# Patient Record
Sex: Male | Born: 1945
Health system: Southern US, Community
[De-identification: ages and names within clinical notes are randomized; demographics above are authoritative.]

## PROBLEM LIST (undated history)

## (undated) DIAGNOSIS — D649 Anemia, unspecified: Secondary | ICD-10-CM

## (undated) DIAGNOSIS — I639 Cerebral infarction, unspecified: Secondary | ICD-10-CM

## (undated) DIAGNOSIS — M109 Gout, unspecified: Secondary | ICD-10-CM

## (undated) DIAGNOSIS — Z923 Personal history of irradiation: Secondary | ICD-10-CM

## (undated) DIAGNOSIS — G609 Hereditary and idiopathic neuropathy, unspecified: Secondary | ICD-10-CM

## (undated) DIAGNOSIS — Z9889 Other specified postprocedural states: Secondary | ICD-10-CM

## (undated) DIAGNOSIS — E669 Obesity, unspecified: Secondary | ICD-10-CM

## (undated) DIAGNOSIS — N189 Chronic kidney disease, unspecified: Secondary | ICD-10-CM

## (undated) DIAGNOSIS — R0609 Other forms of dyspnea: Secondary | ICD-10-CM

## (undated) DIAGNOSIS — G4733 Obstructive sleep apnea (adult) (pediatric): Secondary | ICD-10-CM

## (undated) DIAGNOSIS — K5790 Diverticulosis of intestine, part unspecified, without perforation or abscess without bleeding: Secondary | ICD-10-CM

## (undated) DIAGNOSIS — R609 Edema, unspecified: Secondary | ICD-10-CM

## (undated) DIAGNOSIS — N393 Stress incontinence (female) (male): Secondary | ICD-10-CM

## (undated) DIAGNOSIS — F528 Other sexual dysfunction not due to a substance or known physiological condition: Secondary | ICD-10-CM

## (undated) DIAGNOSIS — J449 Chronic obstructive pulmonary disease, unspecified: Secondary | ICD-10-CM

## (undated) DIAGNOSIS — I4891 Unspecified atrial fibrillation: Secondary | ICD-10-CM

## (undated) DIAGNOSIS — M1990 Unspecified osteoarthritis, unspecified site: Secondary | ICD-10-CM

## (undated) DIAGNOSIS — C801 Malignant (primary) neoplasm, unspecified: Secondary | ICD-10-CM

## (undated) DIAGNOSIS — R911 Solitary pulmonary nodule: Secondary | ICD-10-CM

## (undated) DIAGNOSIS — D539 Nutritional anemia, unspecified: Secondary | ICD-10-CM

## (undated) DIAGNOSIS — Z902 Acquired absence of lung [part of]: Secondary | ICD-10-CM

## (undated) DIAGNOSIS — G4710 Hypersomnia, unspecified: Secondary | ICD-10-CM

## (undated) DIAGNOSIS — G4730 Sleep apnea, unspecified: Secondary | ICD-10-CM

## (undated) DIAGNOSIS — I10 Essential (primary) hypertension: Secondary | ICD-10-CM

## (undated) DIAGNOSIS — N3281 Overactive bladder: Secondary | ICD-10-CM

## (undated) HISTORY — DX: Acquired absence of lung (part of): Z90.2

## (undated) HISTORY — DX: Nutritional anemia, unspecified: D53.9

## (undated) HISTORY — DX: Solitary pulmonary nodule: R91.1

## (undated) HISTORY — PX: UPPER GASTROINTESTINAL ENDOSCOPY: SHX188

## (undated) HISTORY — DX: Edema, unspecified: R60.9

## (undated) HISTORY — DX: Anemia, unspecified: D64.9

## (undated) HISTORY — DX: Diverticulosis of intestine, part unspecified, without perforation or abscess without bleeding: K57.90

## (undated) HISTORY — PX: POLYPECTOMY: SHX149

## (undated) HISTORY — DX: Obstructive sleep apnea (adult) (pediatric): G47.33

## (undated) HISTORY — DX: Essential (primary) hypertension: I10

## (undated) HISTORY — DX: Other specified postprocedural states: Z98.890

## (undated) HISTORY — DX: Obesity, unspecified: E66.9

## (undated) HISTORY — DX: Other sexual dysfunction not due to a substance or known physiological condition: F52.8

## (undated) HISTORY — DX: Hypersomnia, unspecified: G47.10

## (undated) HISTORY — DX: Overactive bladder: N32.81

## (undated) HISTORY — DX: Other forms of dyspnea: R06.09

## (undated) HISTORY — DX: Gout, unspecified: M10.9

## (undated) HISTORY — DX: Unspecified osteoarthritis, unspecified site: M19.90

## (undated) HISTORY — DX: Chronic kidney disease, unspecified: N18.9

## (undated) HISTORY — DX: Stress incontinence (female) (male): N39.3

## (undated) HISTORY — DX: Hereditary and idiopathic neuropathy, unspecified: G60.9

## (undated) HISTORY — DX: Unspecified atrial fibrillation: I48.91

---

## 1998-01-13 ENCOUNTER — Emergency Department (HOSPITAL_COMMUNITY): Admission: EM | Admit: 1998-01-13 | Discharge: 1998-01-13 | Payer: Self-pay

## 1998-01-19 ENCOUNTER — Emergency Department (HOSPITAL_COMMUNITY): Admission: EM | Admit: 1998-01-19 | Discharge: 1998-01-19 | Payer: Self-pay | Admitting: Endocrinology

## 1998-01-22 ENCOUNTER — Emergency Department (HOSPITAL_COMMUNITY): Admission: EM | Admit: 1998-01-22 | Discharge: 1998-01-22 | Payer: Self-pay | Admitting: Emergency Medicine

## 2000-03-12 ENCOUNTER — Encounter (INDEPENDENT_AMBULATORY_CARE_PROVIDER_SITE_OTHER): Payer: Self-pay | Admitting: Specialist

## 2000-03-12 ENCOUNTER — Other Ambulatory Visit: Admission: RE | Admit: 2000-03-12 | Discharge: 2000-03-12 | Payer: Self-pay | Admitting: Internal Medicine

## 2000-10-22 ENCOUNTER — Emergency Department (HOSPITAL_COMMUNITY): Admission: EM | Admit: 2000-10-22 | Discharge: 2000-10-22 | Payer: Self-pay | Admitting: Emergency Medicine

## 2000-10-23 ENCOUNTER — Ambulatory Visit (HOSPITAL_BASED_OUTPATIENT_CLINIC_OR_DEPARTMENT_OTHER): Admission: RE | Admit: 2000-10-23 | Discharge: 2000-10-23 | Payer: Self-pay | Admitting: Orthopedic Surgery

## 2005-05-02 ENCOUNTER — Ambulatory Visit: Payer: Self-pay | Admitting: Family Medicine

## 2005-05-09 ENCOUNTER — Ambulatory Visit: Payer: Self-pay | Admitting: Family Medicine

## 2006-05-11 ENCOUNTER — Ambulatory Visit: Payer: Self-pay | Admitting: Family Medicine

## 2006-05-11 LAB — CONVERTED CEMR LAB
ALT: 66 units/L — ABNORMAL HIGH (ref 0–40)
AST: 58 units/L — ABNORMAL HIGH (ref 0–37)
Albumin: 3.8 g/dL (ref 3.5–5.2)
Alkaline Phosphatase: 66 units/L (ref 39–117)
BUN: 10 mg/dL (ref 6–23)
Basophils Absolute: 0 10*3/uL (ref 0.0–0.1)
Basophils Relative: 0.6 % (ref 0.0–1.0)
CO2: 34 meq/L — ABNORMAL HIGH (ref 19–32)
Calcium: 10.1 mg/dL (ref 8.4–10.5)
Chloride: 98 meq/L (ref 96–112)
Chol/HDL Ratio, serum: 4.1
Cholesterol: 209 mg/dL (ref 0–200)
Creatinine, Ser: 1.4 mg/dL (ref 0.4–1.5)
Eosinophil percent: 1.1 % (ref 0.0–5.0)
GFR calc non Af Amer: 55 mL/min
Glomerular Filtration Rate, Af Am: 67 mL/min/{1.73_m2}
Glucose, Bld: 114 mg/dL — ABNORMAL HIGH (ref 70–99)
HCT: 49.3 % (ref 39.0–52.0)
HDL: 50.5 mg/dL (ref >39.0)
Hemoglobin: 15.8 g/dL (ref 13.0–17.0)
Hgb A1c MFr Bld: 5.8 % (ref 4.6–6.0)
LDL DIRECT: 132.5 mg/dL
Lymphocytes Relative: 28.8 % (ref 12.0–46.0)
MCHC: 32 g/dL (ref 30.0–36.0)
MCV: 92.8 fL (ref 78.0–100.0)
Monocytes Absolute: 0.9 10*3/uL — ABNORMAL HIGH (ref 0.2–0.7)
Monocytes Relative: 12.4 % — ABNORMAL HIGH (ref 3.0–11.0)
Neutro Abs: 3.9 10*3/uL (ref 1.4–7.7)
Neutrophils Relative %: 57.1 % (ref 43.0–77.0)
PSA: 2.22 ng/mL (ref 0.10–4.00)
Platelets: 201 10*3/uL (ref 150–400)
Potassium: 3.6 meq/L (ref 3.5–5.1)
RBC: 5.31 M/uL (ref 4.22–5.81)
RDW: 13.5 % (ref 11.5–14.6)
Sodium: 139 meq/L (ref 135–145)
TSH: 5.57 microintl units/mL — ABNORMAL HIGH (ref 0.35–5.50)
Total Bilirubin: 0.6 mg/dL (ref 0.3–1.2)
Total Protein: 7.2 g/dL (ref 6.0–8.3)
Triglyceride fasting, serum: 104 mg/dL (ref 0–149)
VLDL: 21 mg/dL (ref 0–40)
WBC: 6.9 10*3/uL (ref 4.5–10.5)

## 2006-05-18 ENCOUNTER — Ambulatory Visit: Payer: Self-pay | Admitting: Family Medicine

## 2007-04-29 ENCOUNTER — Encounter: Payer: Self-pay | Admitting: Family Medicine

## 2007-05-05 ENCOUNTER — Ambulatory Visit: Payer: Self-pay | Admitting: Family Medicine

## 2007-05-05 LAB — CONVERTED CEMR LAB
ALT: 83 units/L — ABNORMAL HIGH (ref 0–53)
AST: 42 units/L — ABNORMAL HIGH (ref 0–37)
Albumin: 3.9 g/dL (ref 3.5–5.2)
Alkaline Phosphatase: 61 units/L (ref 39–117)
BUN: 27 mg/dL — ABNORMAL HIGH (ref 6–23)
Basophils Absolute: 0.1 10*3/uL (ref 0.0–0.1)
Basophils Relative: 0.9 % (ref 0.0–1.0)
Bilirubin, Direct: 0.2 mg/dL (ref 0.0–0.3)
CO2: 36 meq/L — ABNORMAL HIGH (ref 19–32)
Calcium: 9.9 mg/dL (ref 8.4–10.5)
Chloride: 96 meq/L (ref 96–112)
Cholesterol: 224 mg/dL (ref 0–200)
Creatinine, Ser: 1.6 mg/dL — ABNORMAL HIGH (ref 0.4–1.5)
Direct LDL: 135.8 mg/dL
Eosinophils Absolute: 0.1 10*3/uL (ref 0.0–0.6)
Eosinophils Relative: 0.6 % (ref 0.0–5.0)
GFR calc Af Amer: 57 mL/min
GFR calc non Af Amer: 47 mL/min
Glucose, Bld: 118 mg/dL — ABNORMAL HIGH (ref 70–99)
HCT: 49.9 % (ref 39.0–52.0)
HDL: 55.8 mg/dL (ref >39.0)
Hemoglobin: 16.7 g/dL (ref 13.0–17.0)
Lymphocytes Relative: 26.3 % (ref 12.0–46.0)
MCHC: 33.4 g/dL (ref 30.0–36.0)
MCV: 93.1 fL (ref 78.0–100.0)
Monocytes Absolute: 1.2 10*3/uL — ABNORMAL HIGH (ref 0.2–0.7)
Monocytes Relative: 11.9 % — ABNORMAL HIGH (ref 3.0–11.0)
Neutro Abs: 6.3 10*3/uL (ref 1.4–7.7)
Neutrophils Relative %: 60.3 % (ref 43.0–77.0)
PSA: 1.85 ng/mL (ref 0.10–4.00)
Platelets: 181 10*3/uL (ref 150–400)
Potassium: 4 meq/L (ref 3.5–5.1)
RBC: 5.36 M/uL (ref 4.22–5.81)
RDW: 13.6 % (ref 11.5–14.6)
Sodium: 140 meq/L (ref 135–145)
TSH: 4.99 microintl units/mL (ref 0.35–5.50)
Total Bilirubin: 0.8 mg/dL (ref 0.3–1.2)
Total CHOL/HDL Ratio: 4
Total Protein: 6.8 g/dL (ref 6.0–8.3)
Triglycerides: 151 mg/dL — ABNORMAL HIGH (ref 0–149)
VLDL: 30 mg/dL (ref 0–40)
WBC: 10.5 10*3/uL (ref 4.5–10.5)

## 2007-05-11 ENCOUNTER — Ambulatory Visit: Payer: Self-pay | Admitting: Family Medicine

## 2007-05-11 DIAGNOSIS — Z860100 Personal history of colon polyps, unspecified: Secondary | ICD-10-CM | POA: Insufficient documentation

## 2007-05-11 DIAGNOSIS — F528 Other sexual dysfunction not due to a substance or known physiological condition: Secondary | ICD-10-CM

## 2007-05-11 DIAGNOSIS — I10 Essential (primary) hypertension: Secondary | ICD-10-CM

## 2007-05-11 DIAGNOSIS — M109 Gout, unspecified: Secondary | ICD-10-CM

## 2007-05-11 HISTORY — DX: Other sexual dysfunction not due to a substance or known physiological condition: F52.8

## 2007-05-11 HISTORY — DX: Gout, unspecified: M10.9

## 2007-08-27 DIAGNOSIS — L02519 Cutaneous abscess of unspecified hand: Secondary | ICD-10-CM

## 2007-08-27 DIAGNOSIS — L03119 Cellulitis of unspecified part of limb: Secondary | ICD-10-CM

## 2007-08-31 ENCOUNTER — Ambulatory Visit: Payer: Self-pay | Admitting: Family Medicine

## 2007-09-02 ENCOUNTER — Ambulatory Visit: Payer: Self-pay | Admitting: Family Medicine

## 2008-04-13 ENCOUNTER — Ambulatory Visit: Payer: Self-pay | Admitting: Internal Medicine

## 2008-04-20 ENCOUNTER — Ambulatory Visit: Payer: Self-pay | Admitting: Family Medicine

## 2008-04-20 LAB — CONVERTED CEMR LAB
ALT: 33 units/L (ref 0–53)
AST: 34 units/L (ref 0–37)
Albumin: 3.9 g/dL (ref 3.5–5.2)
Alkaline Phosphatase: 71 units/L (ref 39–117)
BUN: 15 mg/dL (ref 6–23)
Basophils Absolute: 0 10*3/uL (ref 0.0–0.1)
Basophils Relative: 0.1 % (ref 0.0–3.0)
Bilirubin Urine: NEGATIVE
Bilirubin, Direct: 0.2 mg/dL (ref 0.0–0.3)
CO2: 35 meq/L — ABNORMAL HIGH (ref 19–32)
Calcium: 9.7 mg/dL (ref 8.4–10.5)
Chloride: 97 meq/L (ref 96–112)
Cholesterol: 219 mg/dL (ref 0–200)
Creatinine, Ser: 1.7 mg/dL — ABNORMAL HIGH (ref 0.4–1.5)
Direct LDL: 137 mg/dL
Eosinophils Absolute: 0.1 10*3/uL (ref 0.0–0.7)
Eosinophils Relative: 1.2 % (ref 0.0–5.0)
GFR calc Af Amer: 53 mL/min
GFR calc non Af Amer: 44 mL/min
Glucose, Bld: 127 mg/dL — ABNORMAL HIGH (ref 70–99)
Glucose, Urine, Semiquant: NEGATIVE
HCT: 49 % (ref 39.0–52.0)
HDL: 40.3 mg/dL (ref >39.0)
Hemoglobin: 16.3 g/dL (ref 13.0–17.0)
Ketones, urine, test strip: NEGATIVE
Lymphocytes Relative: 31.1 % (ref 12.0–46.0)
MCHC: 33.2 g/dL (ref 30.0–36.0)
MCV: 92.1 fL (ref 78.0–100.0)
Monocytes Absolute: 0.9 10*3/uL (ref 0.1–1.0)
Monocytes Relative: 14.4 % — ABNORMAL HIGH (ref 3.0–12.0)
Neutro Abs: 3.5 10*3/uL (ref 1.4–7.7)
Neutrophils Relative %: 53.2 % (ref 43.0–77.0)
Nitrite: NEGATIVE
PSA: 3.42 ng/mL (ref 0.10–4.00)
Platelets: 167 10*3/uL (ref 150–400)
Potassium: 3.7 meq/L (ref 3.5–5.1)
RBC: 5.32 M/uL (ref 4.22–5.81)
RDW: 13.9 % (ref 11.5–14.6)
Sodium: 141 meq/L (ref 135–145)
Specific Gravity, Urine: 1.02
TSH: 4.2 microintl units/mL (ref 0.35–5.50)
Total Bilirubin: 1.1 mg/dL (ref 0.3–1.2)
Total CHOL/HDL Ratio: 5.4
Total Protein: 7.5 g/dL (ref 6.0–8.3)
Triglycerides: 151 mg/dL — ABNORMAL HIGH (ref 0–149)
Urobilinogen, UA: 2
VLDL: 30 mg/dL (ref 0–40)
WBC Urine, dipstick: NEGATIVE
WBC: 6.5 10*3/uL (ref 4.5–10.5)
pH: 7.5

## 2008-04-21 ENCOUNTER — Encounter: Payer: Self-pay | Admitting: Internal Medicine

## 2008-04-21 ENCOUNTER — Ambulatory Visit: Payer: Self-pay | Admitting: Internal Medicine

## 2008-04-24 ENCOUNTER — Encounter: Payer: Self-pay | Admitting: Internal Medicine

## 2008-04-27 ENCOUNTER — Ambulatory Visit: Payer: Self-pay | Admitting: Family Medicine

## 2008-05-16 ENCOUNTER — Telehealth: Payer: Self-pay | Admitting: Family Medicine

## 2008-05-31 ENCOUNTER — Ambulatory Visit: Payer: Self-pay | Admitting: Family Medicine

## 2008-05-31 DIAGNOSIS — J069 Acute upper respiratory infection, unspecified: Secondary | ICD-10-CM | POA: Insufficient documentation

## 2008-06-13 ENCOUNTER — Telehealth: Payer: Self-pay | Admitting: Family Medicine

## 2008-06-14 ENCOUNTER — Ambulatory Visit: Payer: Self-pay | Admitting: Family Medicine

## 2008-07-07 HISTORY — PX: COLONOSCOPY: SHX174

## 2008-09-29 ENCOUNTER — Telehealth: Payer: Self-pay | Admitting: Family Medicine

## 2008-10-26 ENCOUNTER — Telehealth: Payer: Self-pay | Admitting: Family Medicine

## 2009-04-16 ENCOUNTER — Ambulatory Visit: Payer: Self-pay | Admitting: Family Medicine

## 2009-04-16 LAB — CONVERTED CEMR LAB
ALT: 32 units/L (ref 0–53)
AST: 32 units/L (ref 0–37)
Albumin: 3.8 g/dL (ref 3.5–5.2)
Alkaline Phosphatase: 60 units/L (ref 39–117)
BUN: 14 mg/dL (ref 6–23)
Basophils Absolute: 0 10*3/uL (ref 0.0–0.1)
Basophils Relative: 0.3 % (ref 0.0–3.0)
Bilirubin, Direct: 0.1 mg/dL (ref 0.0–0.3)
CO2: 34 meq/L — ABNORMAL HIGH (ref 19–32)
Calcium: 10.1 mg/dL (ref 8.4–10.5)
Chloride: 95 meq/L — ABNORMAL LOW (ref 96–112)
Cholesterol: 217 mg/dL — ABNORMAL HIGH (ref 0–200)
Creatinine, Ser: 1.4 mg/dL (ref 0.4–1.5)
Direct LDL: 143.3 mg/dL
Eosinophils Absolute: 0.1 10*3/uL (ref 0.0–0.7)
Eosinophils Relative: 1.1 % (ref 0.0–5.0)
GFR calc non Af Amer: 65.94 mL/min (ref >60)
Glucose, Bld: 114 mg/dL — ABNORMAL HIGH (ref 70–99)
Glucose, Urine, Semiquant: NEGATIVE
HCT: 47.9 % (ref 39.0–52.0)
HDL: 64.3 mg/dL (ref >39.00)
Hemoglobin: 15.7 g/dL (ref 13.0–17.0)
Lymphocytes Relative: 25.4 % (ref 12.0–46.0)
Lymphs Abs: 1.4 10*3/uL (ref 0.7–4.0)
MCHC: 32.9 g/dL (ref 30.0–36.0)
MCV: 90.7 fL (ref 78.0–100.0)
Monocytes Absolute: 1 10*3/uL (ref 0.1–1.0)
Monocytes Relative: 18.2 % — ABNORMAL HIGH (ref 3.0–12.0)
Neutro Abs: 3.2 10*3/uL (ref 1.4–7.7)
Neutrophils Relative %: 55 % (ref 43.0–77.0)
Nitrite: NEGATIVE
Platelets: 174 10*3/uL (ref 150.0–400.0)
Potassium: 4.3 meq/L (ref 3.5–5.1)
RBC: 5.28 M/uL (ref 4.22–5.81)
RDW: 14 % (ref 11.5–14.6)
Sodium: 140 meq/L (ref 135–145)
Specific Gravity, Urine: 1.025
TSH: 4.29 microintl units/mL (ref 0.35–5.50)
Total Bilirubin: 0.8 mg/dL (ref 0.3–1.2)
Total CHOL/HDL Ratio: 3
Total Protein: 7.1 g/dL (ref 6.0–8.3)
Triglycerides: 94 mg/dL (ref 0.0–149.0)
Urobilinogen, UA: 1
VLDL: 18.8 mg/dL (ref 0.0–40.0)
WBC Urine, dipstick: NEGATIVE
WBC: 5.7 10*3/uL (ref 4.5–10.5)
pH: 7

## 2009-05-11 ENCOUNTER — Telehealth: Payer: Self-pay | Admitting: Family Medicine

## 2009-05-15 ENCOUNTER — Encounter (INDEPENDENT_AMBULATORY_CARE_PROVIDER_SITE_OTHER): Payer: Self-pay | Admitting: *Deleted

## 2009-05-15 ENCOUNTER — Ambulatory Visit: Payer: Self-pay | Admitting: Family Medicine

## 2009-05-22 LAB — CONVERTED CEMR LAB: PSA: 3.12 ng/mL (ref 0.10–4.00)

## 2009-05-29 ENCOUNTER — Ambulatory Visit: Payer: Self-pay | Admitting: Family Medicine

## 2009-12-20 ENCOUNTER — Ambulatory Visit: Payer: Self-pay | Admitting: Family Medicine

## 2009-12-20 DIAGNOSIS — I739 Peripheral vascular disease, unspecified: Secondary | ICD-10-CM

## 2009-12-20 LAB — CONVERTED CEMR LAB
BUN: 22 mg/dL (ref 6–23)
Basophils Absolute: 0 10*3/uL (ref 0.0–0.1)
Basophils Relative: 0.5 % (ref 0.0–3.0)
CO2: 36 meq/L — ABNORMAL HIGH (ref 19–32)
Calcium: 9.8 mg/dL (ref 8.4–10.5)
Chloride: 98 meq/L (ref 96–112)
Creatinine, Ser: 1.7 mg/dL — ABNORMAL HIGH (ref 0.4–1.5)
Eosinophils Absolute: 0.1 10*3/uL (ref 0.0–0.7)
Eosinophils Relative: 0.9 % (ref 0.0–5.0)
GFR calc non Af Amer: 53.68 mL/min (ref >60)
Glucose, Bld: 119 mg/dL — ABNORMAL HIGH (ref 70–99)
HCT: 44.4 % (ref 39.0–52.0)
Hemoglobin: 14.8 g/dL (ref 13.0–17.0)
Hgb A1c MFr Bld: 6 % (ref 4.6–6.5)
Lymphocytes Relative: 22.6 % (ref 12.0–46.0)
Lymphs Abs: 1.7 10*3/uL (ref 0.7–4.0)
MCHC: 33.4 g/dL (ref 30.0–36.0)
MCV: 93.2 fL (ref 78.0–100.0)
Monocytes Absolute: 1.2 10*3/uL — ABNORMAL HIGH (ref 0.1–1.0)
Monocytes Relative: 16 % — ABNORMAL HIGH (ref 3.0–12.0)
Neutro Abs: 4.6 10*3/uL (ref 1.4–7.7)
Neutrophils Relative %: 60 % (ref 43.0–77.0)
Platelets: 220 10*3/uL (ref 150.0–400.0)
Potassium: 3.8 meq/L (ref 3.5–5.1)
RBC: 4.76 M/uL (ref 4.22–5.81)
RDW: 15.2 % — ABNORMAL HIGH (ref 11.5–14.6)
Sodium: 141 meq/L (ref 135–145)
WBC: 7.7 10*3/uL (ref 4.5–10.5)

## 2009-12-31 ENCOUNTER — Encounter: Payer: Self-pay | Admitting: Family Medicine

## 2010-01-01 ENCOUNTER — Encounter: Payer: Self-pay | Admitting: Family Medicine

## 2010-01-01 ENCOUNTER — Ambulatory Visit: Payer: Self-pay

## 2010-01-25 ENCOUNTER — Ambulatory Visit: Payer: Self-pay | Admitting: Family Medicine

## 2010-04-24 ENCOUNTER — Ambulatory Visit (HOSPITAL_COMMUNITY)
Admission: RE | Admit: 2010-04-24 | Discharge: 2010-04-24 | Payer: Self-pay | Source: Home / Self Care | Admitting: Neurology

## 2010-05-29 ENCOUNTER — Ambulatory Visit: Payer: Self-pay | Admitting: Family Medicine

## 2010-05-29 LAB — CONVERTED CEMR LAB
ALT: 44 units/L (ref 0–53)
AST: 48 units/L — ABNORMAL HIGH (ref 0–37)
Albumin: 4.1 g/dL (ref 3.5–5.2)
Alkaline Phosphatase: 68 units/L (ref 39–117)
BUN: 21 mg/dL (ref 6–23)
Basophils Absolute: 0.1 10*3/uL (ref 0.0–0.1)
Basophils Relative: 0.6 % (ref 0.0–3.0)
Bilirubin, Direct: 0.2 mg/dL (ref 0.0–0.3)
Blood in Urine, dipstick: NEGATIVE
CO2: 32 meq/L (ref 19–32)
Calcium: 10 mg/dL (ref 8.4–10.5)
Chloride: 98 meq/L (ref 96–112)
Cholesterol: 215 mg/dL — ABNORMAL HIGH (ref 0–200)
Creatinine, Ser: 1.81 mg/dL — ABNORMAL HIGH (ref 0.40–1.50)
Eosinophils Absolute: 0.2 10*3/uL (ref 0.0–0.7)
Eosinophils Relative: 1.7 % (ref 0.0–5.0)
Glucose, Bld: 128 mg/dL — ABNORMAL HIGH (ref 70–99)
Glucose, Urine, Semiquant: NEGATIVE
HCT: 45.3 % (ref 39.0–52.0)
HDL: 46 mg/dL (ref >39)
Hemoglobin: 14.9 g/dL (ref 13.0–17.0)
Indirect Bilirubin: 0.7 mg/dL (ref 0.0–0.9)
LDL Cholesterol: 131 mg/dL — ABNORMAL HIGH (ref 0–99)
Lymphocytes Relative: 24.8 % (ref 12.0–46.0)
Lymphs Abs: 2.3 10*3/uL (ref 0.7–4.0)
MCHC: 32.9 g/dL (ref 30.0–36.0)
MCV: 94.6 fL (ref 78.0–100.0)
Monocytes Absolute: 1.4 10*3/uL — ABNORMAL HIGH (ref 0.1–1.0)
Monocytes Relative: 14.9 % — ABNORMAL HIGH (ref 3.0–12.0)
Neutro Abs: 5.4 10*3/uL (ref 1.4–7.7)
Neutrophils Relative %: 58 % (ref 43.0–77.0)
Nitrite: NEGATIVE
PSA: 3.36 ng/mL (ref <=4.00)
Platelets: 216 10*3/uL (ref 150.0–400.0)
Potassium: 3.8 meq/L (ref 3.5–5.3)
RBC: 4.78 M/uL (ref 4.22–5.81)
RDW: 15.3 % — ABNORMAL HIGH (ref 11.5–14.6)
Sodium: 141 meq/L (ref 135–145)
Specific Gravity, Urine: 1.02
TSH: 4.757 microintl units/mL — ABNORMAL HIGH (ref 0.350–4.500)
Total Bilirubin: 0.9 mg/dL (ref 0.3–1.2)
Total CHOL/HDL Ratio: 4.7
Total Protein: 7 g/dL (ref 6.0–8.3)
Triglycerides: 188 mg/dL — ABNORMAL HIGH (ref <150)
Urobilinogen, UA: 4
VLDL: 38 mg/dL (ref 0–40)
WBC Urine, dipstick: NEGATIVE
WBC: 9.3 10*3/uL (ref 4.5–10.5)
pH: 7.5

## 2010-06-05 ENCOUNTER — Encounter: Payer: Self-pay | Admitting: Family Medicine

## 2010-06-05 ENCOUNTER — Ambulatory Visit: Payer: Self-pay | Admitting: Family Medicine

## 2010-06-14 ENCOUNTER — Telehealth: Payer: Self-pay | Admitting: Family Medicine

## 2010-06-27 ENCOUNTER — Ambulatory Visit: Payer: Self-pay | Admitting: Family Medicine

## 2010-07-10 ENCOUNTER — Telehealth: Payer: Self-pay | Admitting: Family Medicine

## 2010-07-12 ENCOUNTER — Telehealth: Payer: Self-pay | Admitting: Family Medicine

## 2010-07-15 ENCOUNTER — Telehealth: Payer: Self-pay | Admitting: Family Medicine

## 2010-07-29 ENCOUNTER — Ambulatory Visit
Admission: RE | Admit: 2010-07-29 | Discharge: 2010-07-29 | Payer: Self-pay | Source: Home / Self Care | Attending: Pulmonary Disease | Admitting: Pulmonary Disease

## 2010-07-29 DIAGNOSIS — G4733 Obstructive sleep apnea (adult) (pediatric): Secondary | ICD-10-CM

## 2010-07-29 HISTORY — DX: Obstructive sleep apnea (adult) (pediatric): G47.33

## 2010-08-08 ENCOUNTER — Other Ambulatory Visit: Payer: Self-pay | Admitting: *Deleted

## 2010-08-08 DIAGNOSIS — I10 Essential (primary) hypertension: Secondary | ICD-10-CM

## 2010-08-08 NOTE — Assessment & Plan Note (Signed)
Summary: leg swelling//ccm   Vital Signs:  Patient profile:   65 year old male Weight:      216 pounds Temp:     98.3 degrees F oral BP sitting:   140 / 80  (left arm) Cuff size:   regular  Vitals Entered By: Westley Hummer CMA Deborra Medina) (December 20, 2009 12:25 PM) CC: numbness, burning with feet   CC:  numbness and burning with feet.  History of Present Illness: Jason [name] is a 65 year old, married male, nonsmoker, who comes in today for evaluation of numbness in his legs with exertion x 1 month.  He states about a month ago he noticed some numbness in his feet.  With exercise.  When he stops walking.  The numbness goes away.  He said no history of trauma.  He does have underlying hypertension, which is well controlled with Tenoretic 5025 daily, Cardura, 4 mg daily, Norvasc 10 mg daily.  BP 140/80.  Review of systems negative.  No history of diabetes  fasting blood sugar last October, 114  he smoked until 2008  Allergies: No Known Drug Allergies  Past History:  Past medical, surgical, family and social histories (including risk factors) reviewed, and no changes noted (except as noted below).  Past Medical History: Reviewed history from 05/11/2007 and no changes required. Gout Hypertension ED Colonic polyps, hx of  Family History: Reviewed history from 05/11/2007 and no changes required. Family History Hypertension  Social History: Reviewed history from 05/11/2007 and no changes required. Married Former Smoker Alcohol use-yes Drug use-no Regular exercise-yes  Physical Exam  General:  Well-developed,well-nourished,in no acute distress; alert,appropriate and cooperative throughout examination Msk:  No deformity or scoliosis noted of thoracic or lumbar spine.   Pulses:  femorals 2+ popliteals 1+ dorsalis pedis and posterior tibial 1+ Extremities:  No clubbing, cyanosis, edema, or deformity noted with normal full range of motion of all joints.   Neurologic:  No cranial  nerve deficits noted. Station and gait are normal. Plantar reflexes are down-going bilaterally. DTRs are symmetrical throughout. Sensory, motor and coordinative functions appear intact. Skin:  Intact without suspicious lesions or rashes   Impression & Recommendations:  Problem # 1:  CLAUDICATION (ICD-443.9) Assessment New  Orders: LE Arterial Doppler/ABI (Le arterial doppler) Venipuncture [id number]) TLB-BMP (Basic Metabolic Panel-BMET) (99991111) TLB-CBC Platelet - w/Differential (85025-CBCD) TLB-A1C / Hgb A1C (Glycohemoglobin) (83036-A1C)  Complete Medication List: 1)  Cardura 4 Mg Tabs (Doxazosin mesylate) .... At bedtime 2)  Tenoretic 50 50-25 Mg Tabs (Atenolol-chlorthalidone) .... Qam 3)  Allopurinol 300 Mg Tabs (Allopurinol) .... Once daily 4)  Colchicine 0.6 Mg Tabs (Colchicine) .... As needed 5)  Prednisone 20 Mg Tabs (Prednisone) .... Uad 6)  Indomethacin 25 Mg Caps (Indomethacin) .... Tid 7)  Viagra 100 Mg Tabs (Sildenafil citrate) .... Uad 8)  Norvasc 10 Mg Tabs (Amlodipine besylate) .... Take 1 tablet by mouth every morning  Patient Instructions: 1)  return one week after your vascular study for follow-up.......... take a baby aspirin daily

## 2010-08-08 NOTE — Assessment & Plan Note (Signed)
Summary: acute   Vital Signs:  Patient profile:   65 year old male Weight:      222 pounds Temp:     98.1 degrees F oral BP sitting:   164 / 90  (left arm) Cuff size:   regular  Vitals Entered By: Westley Hummer CMA Deborra Medina) (January 25, 2010 3:36 PM) CC: follow-up visit   CC:  follow-up visit.  History of Present Illness: Jason [name] is a 65 year old male, who comes in today for evaluation of what we originally thought was claudication.  He came in complaining of pain and numbness in his leg when he walked about 50 yards. .  The arterial Doppler shows normal ABIs.  I am wondering now if he doesn't have spinal stenosis.  Allergies: No Known Drug Allergies  Past History:  Past medical, surgical, family and social histories (including risk factors) reviewed for relevance to current acute and chronic problems.  Past Medical History: Reviewed history from 05/11/2007 and no changes required. Gout Hypertension ED Colonic polyps, hx of  Family History: Reviewed history from 05/11/2007 and no changes required. Family History Hypertension  Social History: Reviewed history from 05/11/2007 and no changes required. Married Former Smoker Alcohol use-yes Drug use-no Regular exercise-yes  Review of Systems      See HPI  Physical Exam  General:  Well-developed,well-nourished,in no acute distress; alert,appropriate and cooperative throughout examination   Impression & Recommendations:  Problem # 1:  CLAUDICATION (E1707615.9) Assessment Unchanged  Orders: Neurology Referral (Neuro)  Complete Medication List: 1)  Cardura 4 Mg Tabs (Doxazosin mesylate) .... At bedtime 2)  Tenoretic 50 50-25 Mg Tabs (Atenolol-chlorthalidone) .... Qam 3)  Allopurinol 300 Mg Tabs (Allopurinol) .... Once daily 4)  Colchicine 0.6 Mg Tabs (Colchicine) .... As needed 5)  Prednisone 20 Mg Tabs (Prednisone) .... Uad 6)  Indomethacin 25 Mg Caps (Indomethacin) .... Tid 7)  Viagra 100 Mg Tabs (Sildenafil  citrate) .... Uad 8)  Norvasc 10 Mg Tabs (Amlodipine besylate) .... Take 1 tablet by mouth every morning  Patient Instructions: 1)  we will get to set up with a neurologist for further evaluation

## 2010-08-08 NOTE — Assessment & Plan Note (Addendum)
Summary: consult for possible osa   Copy to:  Stevie Kern Primary Provider/Referring Provider:  [name] MD  CC:  Sleep Consult.  History of Present Illness: The pt is a 65y/o male who I have been asked to see for possible osa.  He has been noted to have loud snoring by his wife, but tells me she has not c/o an abnormal breathing pattern during sleep.  He goes to bed at MN, and arises at 8am to start his day.  He is rested only 50% of the time.  He notes definite sleepiness during the day with periods of inactivity such as reading or tv, and states that he falls asleep easily during the day.  He has not driven in many years.  His weight is "down some" over the last few years, and his epworth score today is 12.  Current Medications (verified): 1)  Cardura 4 Mg  Tabs (Doxazosin Mesylate) .... Take One Tab At Bedtime 2)  Tenoretic 50 50-25 Mg  Tabs (Atenolol-Chlorthalidone) .... Take One Tab Every Morning 3)  Allopurinol 300 Mg  Tabs (Allopurinol) .... Once Daily 4)  Colchicine 0.6 Mg  Tabs (Colchicine) .... As Needed 5)  Indomethacin 25 Mg  Caps (Indomethacin) .... Tid 6)  Viagra 100 Mg Tabs (Sildenafil Citrate) .... Uad 7)  Norvasc 10 Mg Tabs (Amlodipine Besylate) .... Take 1 Tablet By Mouth Every Morning 8)  Gabapentin 100 Mg Caps (Gabapentin) .... Take One Tab Three Times A Day  Allergies (verified): No Known Drug Allergies  Past History:  Past Medical History: Reviewed history from 05/11/2007 and no changes required. Gout Hypertension ED Colonic polyps, hx of  Past Surgical History: denies surgical history  Family History: Reviewed history from 05/11/2007 and no changes required. Family History Hypertension  Social History: Reviewed history from 05/11/2007 and no changes required. Married and lives with wife, [name] Has children Former Smoker.  started at age 44.  1/2 pack per week retired.  Alcohol use-yes Drug use-no Regular exercise-yes  Review of  Systems  The patient denies shortness of breath with activity, shortness of breath at rest, productive cough, non-productive cough, coughing up blood, chest pain, irregular heartbeats, acid heartburn, indigestion, loss of appetite, weight change, abdominal pain, difficulty swallowing, sore throat, tooth/dental problems, headaches, nasal congestion/difficulty breathing through nose, sneezing, itching, ear ache, anxiety, depression, hand/feet swelling, joint stiffness or pain, rash, change in color of mucus, and fever.    Vital Signs:  Patient profile:   65 year old male Height:      67.5 inches Weight:      228.13 pounds BMI:     35.33 O2 Sat:      92 % on Room air Temp:     97.6 degrees F oral Pulse rate:   74 / minute BP sitting:   118 / 78  (left arm) Cuff size:   large  Vitals Entered By: Matthew Folks LPN (January 23, [id number] 9:32 AM)  O2 Flow:  Room air CC: Sleep Consult Comments Medications reviewed with patient Matthew Folks LPN  January 23, [id number] 9:45 AM    Physical Exam  General:  ow male in nad Eyes:  PERRLA and EOMI.   Nose:  large turbs with mucosal edema no obstruction Mouth:  large tongue, elongation of soft palate and uvula, significant soft tissue density posteriorly Neck:  no jvd, tmg, LN Lungs:  clear to auscultation Heart:  rrr, no mrg Abdomen:  soft and nontender, bs+ Extremities:  no edema or  cyanosis pulses intact distally Neurologic:  awake, but does appear sleepy moves all 4 extremities.   Impression & Recommendations:  Problem # 1:  OBSTRUCTIVE SLEEP APNEA (ICD-327.23) the pt's history is very suggestive of osa.  He is obese, has abnormal upper airway anatomy, has snoring with nonrestorative sleep, and clearly has EDS.  I have had a long discussion with the pt about sleep apnea, including its impact on QOL and CV health.  I think he needs to have a sleep study done, and pt is willing to proceed.  I have also encouraged him to work on weight  loss.  Other Orders: Consultation Level IV [id number]) Sleep Disorder Referral (Sleep Disorder)  Patient Instructions: 1)  will order sleep study, and arrange followup with me once results are available. 2)  work on weight loss  Appended Document: consult for possible osa home sleep testing shows AHI 63/hr with desat to ?44%  megan, pt needs ov to review study.  Appended Document: consult for possible osa Called and spoke with pt.  Informed him he needs to sched ov with [place] to discuss sleep study results.  Pt states he would need to check with his wife first and will call back to schedule ov.    Appended Document: consult for possible osa pt called back and is scheduled for 08-21-2010 at 10:15am

## 2010-08-08 NOTE — Telephone Encounter (Signed)
tenoretic 50 is on back order.  Can this be changed?

## 2010-08-08 NOTE — Assessment & Plan Note (Signed)
Summary: cpx/njr   Vital Signs:  Patient profile:   65 year old male Height:      67.5 inches Weight:      224 pounds BMI:     34.69 Temp:     98.5 degrees F oral BP sitting:   124 / 90  (left arm) Cuff size:   regular  Vitals Entered By: Westley Hummer CMA Deborra Medina) (June 05, 2010 10:38 AM) CC: cpx Is Patient Diabetic? No   CC:  cpx.  History of Present Illness: Jezewski is a 64 year old male, retired, nonsmoker, who comes in today for physical examination because of a history of underlying hypertension, gout, erectile dysfunction.  I saw his wife for her physical exam first.  She tells me she is very concerned about her husband because he continues to drink daily.  Excessive amounts of alcohol.  This past summer.  He drank so much he threw up and flushed.  His dentures down the toilet.  His son is high repeat film and has talked about his drinking and offered to take him to AA, but he declined.  Medications reviewed.  No changes except Dr. Lorin Mercy.  His orthopedist has added gabapentin 300 mg daily because of chronic low back pain.  He brings his blood pressure readings in over the last couple weeks.  The very from 135/80 to 180/90.  Tetanus 2009 seasonal flu shot 2010 and today.  Colonoscopy done normal again to 2016  Allergies (verified): No Known Drug Allergies  Past History:  Past medical, surgical, family and social histories (including risk factors) reviewed, and no changes noted (except as noted below).  Past Medical History: Reviewed history from 05/11/2007 and no changes required. Gout Hypertension ED Colonic polyps, hx of  Family History: Reviewed history from 05/11/2007 and no changes required. Family History Hypertension  Social History: Reviewed history from 05/11/2007 and no changes required. Married Former Smoker Alcohol use-yes Drug use-no Regular exercise-yes  Review of Systems      See HPI       Flu Vaccine Consent Questions     Do you have a  history of severe allergic reactions to this vaccine? no    Any prior history of allergic reactions to egg and/or gelatin? no    Do you have a sensitivity to the preservative Thimersol? no    Do you have a past history of Guillan-Barre Syndrome? no    Do you currently have an acute febrile illness? no    Have you ever had a severe reaction to latex? no    Vaccine information given and explained to patient? yes    Are you currently pregnant? no    Lot Number:[id number]   Exp Date:01/04/2011   Site Given Right  Deltoid IM   Physical Exam  General:  Well-developed,well-nourished,in no acute distress; alert,appropriate and cooperative throughout examination Head:  Normocephalic and atraumatic without obvious abnormalities. No apparent alopecia or balding. Eyes:  No corneal or conjunctival inflammation noted. EOMI. Perrla. Funduscopic exam benign, without hemorrhages, exudates or papilledema. Vision grossly normal.......conjunctiva are red bilaterally Ears:  External ear exam shows no significant lesions or deformities.  Otoscopic examination reveals clear canals, tympanic membranes are intact bilaterally without bulging, retraction, inflammation or discharge. Hearing is grossly normal bilaterally. Nose:  External nasal examination shows no deformity or inflammation. Nasal mucosa are pink and moist without lesions or exudates. Mouth:  Oral mucosa and oropharynx without lesions or exudates.  Teeth in good repair. Neck:  No deformities, masses, or tenderness  noted. Chest Wall:  No deformities, masses, tenderness or gynecomastia noted. Breasts:  No masses or gynecomastia noted Lungs:  Normal respiratory effort, chest expands symmetrically. Lungs are clear to auscultation, no crackles or wheezes. Heart:  Normal rate and regular rhythm. S1 and S2 normal without gallop, murmur, click, rub or other extra sounds. Abdomen:  Bowel sounds positive,abdomen soft and non-tender without masses, organomegaly or  hernias noted. Rectal:  No external abnormalities noted. Normal sphincter tone. No rectal masses or tenderness. Genitalia:  Testes bilaterally descended without nodularity, tenderness or masses. No scrotal masses or lesions. No penis lesions or urethral discharge. Prostate:  Prostate gland firm and smooth, no enlargement, nodularity, tenderness, mass, asymmetry or induration. Msk:  No deformity or scoliosis noted of thoracic or lumbar spine.   Pulses:  R and L carotid,radial,femoral,dorsalis pedis and posterior tibial pulses are full and equal bilaterally Extremities:  No clubbing, cyanosis, edema, or deformity noted with normal full range of motion of all joints.   Neurologic:  No cranial nerve deficits noted. Station and gait are normal. Plantar reflexes are down-going bilaterally. DTRs are symmetrical throughout. Sensory, motor and coordinative functions appear intact. Skin:  Intact without suspicious lesions or rashes Cervical Nodes:  No lymphadenopathy noted Axillary Nodes:  No palpable lymphadenopathy Inguinal Nodes:  No significant adenopathy Psych:  Cognition and judgment appear intact. Alert and cooperative with normal attention span and concentration. No apparent delusions, illusions, hallucinations   Impression & Recommendations:  Problem # 1:  ERECTILE DYSFUNCTION, MILD (ICD-302.72) Assessment Unchanged  His updated medication list for this problem includes:    Viagra 100 Mg Tabs (Sildenafil citrate) ..... Uad  Problem # 2:  HYPERTENSION (ICD-401.9) Assessment: Deteriorated  His updated medication list for this problem includes:    Cardura 4 Mg Tabs (Doxazosin mesylate) .Marland Kitchen... At bedtime    Tenoretic 50 50-25 Mg Tabs (Atenolol-chlorthalidone) ..... Qam    Norvasc 10 Mg Tabs (Amlodipine besylate) .Marland Kitchen... Take 1 tablet by mouth every morning  Orders: EKG w/ Interpretation (93000)  Problem # 3:  GOUT (ICD-274.9) Assessment: Improved  His updated medication list for this  problem includes:    Allopurinol 300 Mg Tabs (Allopurinol) ..... Once daily    Colchicine 0.6 Mg Tabs (Colchicine) .Marland Kitchen... As needed  Problem # 4:  HEALTH SCREENING (ICD-V70.0) Assessment: Unchanged  Orders: EKG w/ Interpretation (93000)  Complete Medication List: 1)  Cardura 4 Mg Tabs (Doxazosin mesylate) .... At bedtime 2)  Tenoretic 50 50-25 Mg Tabs (Atenolol-chlorthalidone) .... Qam 3)  Allopurinol 300 Mg Tabs (Allopurinol) .... Once daily 4)  Colchicine 0.6 Mg Tabs (Colchicine) .... As needed 5)  Prednisone 20 Mg Tabs (Prednisone) .... Uad 6)  Indomethacin 25 Mg Caps (Indomethacin) .... Tid 7)  Viagra 100 Mg Tabs (Sildenafil citrate) .... Uad 8)  Norvasc 10 Mg Tabs (Amlodipine besylate) .... Take 1 tablet by mouth every morning 9)  Gabapentin 100 Mg Caps (Gabapentin) .... Take one tab three times a day  Other Orders: Admin 1st Vaccine [id number]) Flu Vaccine 89yrs + [id number])  Patient Instructions: 1)  continue your blood pressure medication as u r  currently   taking it.  However, I want you to stay on a complete salt free diet, walk 15 minutes daily, decrease y  alcohol consumption to no more than two beers daily.  Check y  blood pressure daily.  Return in 3 weeks for follow-up with the data and the device 2)  Please schedule a follow-up appointment in 1 year. Prescriptions: NORVASC 10 MG  TABS (AMLODIPINE BESYLATE) Take 1 tablet by mouth every morning  #100 Tablet x 3   Entered and Authorized by:   [name] MD   Signed by:   [name] MD on 06/05/2010   Method used:   Print then Give to Patient   RxID:   [id number] VIAGRA 100 MG TABS (SILDENAFIL CITRATE) UAD  #6 x 11   Entered and Authorized by:   [name] MD   Signed by:   [name] MD on 06/05/2010   Method used:   Print then Give to Patient   RxID:   [id number] ALLOPURINOL 300 MG  TABS (ALLOPURINOL) once daily  #100 x 3   Entered and Authorized by:   [name] MD   Signed by:   [name] MD on 06/05/2010   Method used:   Print then Give to Patient   RxID:   [id number] TENORETIC 50 50-25 MG  TABS (ATENOLOL-CHLORTHALIDONE) QAM  #100 x 3   Entered and Authorized by:   [name] MD   Signed by:   [name] MD on 06/05/2010   Method used:   Print then Give to Patient   RxID:   [id number] CARDURA 4 MG  TABS (DOXAZOSIN MESYLATE) at bedtime  #100 x 3   Entered and Authorized by:   [name] MD   Signed by:   [name] MD on 06/05/2010   Method used:   Print then Give to Patient   RxID:   [id number]    Orders Added: 1)  Est. Patient 40-64 years [id number] 2)  EKG w/ Interpretation [93000] 3)  Admin 1st Vaccine [90471] 4)  Flu Vaccine 11yrs + [id number]

## 2010-08-08 NOTE — Miscellaneous (Signed)
Summary: Orders Update  Clinical Lists Changes  Orders: Added new Test order of Arterial Duplex Lower Extremity (Arterial Duplex Low) - Signed 

## 2010-08-08 NOTE — Assessment & Plan Note (Signed)
Summary: 3 wks rov/mm/pt rescd from [facility]//ccm   Vital Signs:  Patient profile:   65 year old male Weight:      223 pounds Temp:     98.8 degrees F oral BP sitting:   130 / 78  (left arm) Cuff size:   regular CC: 3 week f/u bp   CC:  3 week f/u bp.  History of Present Illness: Jason [name] is a delightful, 65 year old male, who comes in today for follow-up of hypertension.  We changed his medications about a month ago.  He's currently on Tenoretic 50 -- 25 daily, Cardura, 4 mg nightly, Norvasc, 10 mg daily, now.  BP is normalized, 130/78  His ophthalmologist, Dr. Hetty Blend is requesting a sleep apnea study  Allergies: No Known Drug Allergies  Past History:  Past medical, surgical, family and social histories (including risk factors) reviewed for relevance to current acute and chronic problems.  Past Medical History: Reviewed history from 05/11/2007 and no changes required. Gout Hypertension ED Colonic polyps, hx of  Family History: Reviewed history from 05/11/2007 and no changes required. Family History Hypertension  Social History: Reviewed history from 05/11/2007 and no changes required. Married Former Smoker Alcohol use-yes Drug use-no Regular exercise-yes  Review of Systems      See HPI  Physical Exam  General:  Well-developed,well-nourished,in no acute distress; alert,appropriate and cooperative throughout examination   Impression & Recommendations:  Problem # 1:  HYPERTENSION (ICD-401.9) Assessment Improved  His updated medication list for this problem includes:    Cardura 4 Mg Tabs (Doxazosin mesylate) .Marland Kitchen... At bedtime    Tenoretic 50 50-25 Mg Tabs (Atenolol-chlorthalidone) ..... Qam    Norvasc 10 Mg Tabs (Amlodipine besylate) .Marland Kitchen... Take 1 tablet by mouth every morning  Complete Medication List: 1)  Cardura 4 Mg Tabs (Doxazosin mesylate) .... At bedtime 2)  Tenoretic 50 50-25 Mg Tabs (Atenolol-chlorthalidone) .... Qam 3)  Allopurinol 300 Mg  Tabs (Allopurinol) .... Once daily 4)  Colchicine 0.6 Mg Tabs (Colchicine) .... As needed 5)  Prednisone 20 Mg Tabs (Prednisone) .... Uad 6)  Indomethacin 25 Mg Caps (Indomethacin) .... Tid 7)  Viagra 100 Mg Tabs (Sildenafil citrate) .... Uad 8)  Norvasc 10 Mg Tabs (Amlodipine besylate) .... Take 1 tablet by mouth every morning 9)  Gabapentin 100 Mg Caps (Gabapentin) .... Take one tab three times a day  Other Orders: Pulmonary Referral (Pulmonary)  Patient Instructions: 1)  continue your current medications.  Follow-up in one year for your annual physical exam, sooner if any problems Prescriptions: NORVASC 10 MG TABS (AMLODIPINE BESYLATE) Take 1 tablet by mouth every morning  #100 Tablet x 3   Entered and Authorized by:   [name] MD   Signed by:   [name] MD on 07/12/2010   Method used:   Print then Give to Patient   RxID:   [id number] TENORETIC 50 50-25 MG  TABS (ATENOLOL-CHLORTHALIDONE) QAM  #100 x 3   Entered and Authorized by:   [name] MD   Signed by:   [name] MD on 07/12/2010   Method used:   Print then Give to Patient   RxID:   [id number] CARDURA 4 MG  TABS (DOXAZOSIN MESYLATE) at bedtime  #100 x 3   Entered and Authorized by:   [name] MD   Signed by:   [name] MD on 07/12/2010   Method used:   Print then Give to Patient   RxID:   [id number]  Orders Added: 1)  Est. Patient Level III [id number] 2)  Pulmonary Referral [Pulmonary]

## 2010-08-08 NOTE — Progress Notes (Signed)
Summary: refill request  Phone Note Refill Request Message from:  Fax from Pharmacy on July 10, 2010 11:00 AM  Refills Requested: Medication #1:  NORVASC 10 MG TABS Take 1 tablet by mouth every morning Initial call taken by: Westley Hummer CMA ([place]),  July 10, 2010 11:00 AM    Prescriptions: NORVASC 10 MG TABS (AMLODIPINE BESYLATE) Take 1 tablet by mouth every morning  #100 Tablet x 3   Entered by:   Westley Hummer CMA ([place])   Authorized by:   [name] MD   Signed by:   Westley Hummer CMA ([place]) on 07/10/2010   Method used:   Electronically to        [facility] (retail)       [address]       [place], [place]  [address]       Ph: [phone]       Fax: [phone]   RxID:   [id number]

## 2010-08-08 NOTE — Progress Notes (Signed)
Summary: please return call  Phone Note Call from Patient Call back at Home Phone [phone]   Caller: Patient--live call Summary of Call: has ? for the nurse. please return hia call. no for message was given. Initial call taken by: Despina Arias,  July 15, 2010 9:19 AM  Follow-up for Phone Call        patient will have sleep study done Follow-up by: Westley Hummer CMA Deborra Medina),  July 15, 2010 11:01 AM

## 2010-08-08 NOTE — Progress Notes (Signed)
Summary: refill clairification  Phone Note From Pharmacy   Caller: [facility] DrMarland Kitchen Summary of Call: pharmacy calling for rx clarification Initial call taken by: Westley Hummer CMA Deborra Medina),  July 12, 2010 4:03 PM    New/Updated Medications: CARDURA 4 MG  TABS (DOXAZOSIN MESYLATE) take one tab at bedtime TENORETIC 50 50-25 MG  TABS (ATENOLOL-CHLORTHALIDONE) take one tab every morning Prescriptions: TENORETIC 50 50-25 MG  TABS (ATENOLOL-CHLORTHALIDONE) take one tab every morning  #100 x 3   Entered by:   Westley Hummer CMA ([place])   Authorized by:   [name] MD   Signed by:   Westley Hummer CMA ([place]) on 07/12/2010   Method used:   Electronically to        [facility] (retail)       [address]       [place], [place]  [address]       Ph: [phone]       Fax: [phone]   RxID:   [id number] CARDURA 4 MG  TABS (DOXAZOSIN MESYLATE) take one tab at bedtime  #100 x 3   Entered by:   Westley Hummer CMA ([place])   Authorized by:   [name] MD   Signed by:   Westley Hummer CMA ([place]) on 07/12/2010   Method used:   Electronically to        [facility] (retail)       [address]       [place], [place]  [address]       Ph: [phone]       Fax: [phone]   RxID:   [id number]

## 2010-08-08 NOTE — Progress Notes (Signed)
Summary: refill allopurinol  Phone Note Refill Request Message from:  Fax from Pharmacy on June 14, 2010 11:34 AM  Refills Requested: Medication #1:  ALLOPURINOL 300 MG  TABS once daily Initial call taken by: Westley Hummer CMA Deborra Medina),  June 14, 2010 11:34 AM    Prescriptions: ALLOPURINOL 300 MG  TABS (ALLOPURINOL) once daily  #100 x 3   Entered by:   Westley Hummer CMA ([place])   Authorized by:   [name] MD   Signed by:   Westley Hummer CMA ([place]) on 06/14/2010   Method used:   Electronically to        [facility] (retail)       [address]       [place], [place]  [address]       Ph: [phone]       Fax: [phone]   RxID:   [id number]

## 2010-08-09 MED ORDER — HYDROCHLOROTHIAZIDE 25 MG PO TABS: 25.0000 mg | ORAL_TABLET | Freq: Every day | ORAL | Status: DC

## 2010-08-09 MED ORDER — ATENOLOL 50 MG PO TABS: 50.0000 mg | ORAL_TABLET | Freq: Every day | ORAL | Status: DC

## 2010-08-09 NOTE — Telephone Encounter (Signed)
Rachel please call.  Options are used, the 25-mg tablets or the 100-mg tablets to get the 50-mg dose

## 2010-08-09 NOTE — Telephone Encounter (Signed)
Spoke with pharmacy and no theoretic available.  Ok to change to tenormin 50 HCTZ 25 per Dr Sherren Mocha.

## 2010-08-12 ENCOUNTER — Telehealth: Payer: Self-pay | Admitting: Family Medicine

## 2010-08-12 NOTE — Telephone Encounter (Signed)
Spoke with patient.

## 2010-08-12 NOTE — Telephone Encounter (Signed)
Pt requesting to speak w/ Apolonio Schneiders in regards to no pharmacy in area having atenolol 50mg .  Needs an alternative.

## 2010-08-14 ENCOUNTER — Telehealth: Payer: Self-pay | Admitting: Family Medicine

## 2010-08-14 DIAGNOSIS — I10 Essential (primary) hypertension: Secondary | ICD-10-CM

## 2010-08-14 MED ORDER — ATENOLOL 50 MG PO TABS: 50.0000 mg | ORAL_TABLET | Freq: Every day | ORAL | Status: DC

## 2010-08-14 NOTE — Telephone Encounter (Signed)
Pt called and said that his pharmacy, [facility] on [address], still has not heard from Dr. Gweneth Fritter office re: Atenolol 50-25mg . Pt is req alternative to be called in asap today.

## 2010-08-14 NOTE — Telephone Encounter (Signed)
Prior rx sent to wrong pharmacy 

## 2010-08-21 ENCOUNTER — Encounter: Payer: Self-pay | Admitting: Pulmonary Disease

## 2010-08-21 ENCOUNTER — Ambulatory Visit (INDEPENDENT_AMBULATORY_CARE_PROVIDER_SITE_OTHER): Payer: PRIVATE HEALTH INSURANCE | Admitting: Pulmonary Disease

## 2010-08-21 DIAGNOSIS — G4733 Obstructive sleep apnea (adult) (pediatric): Secondary | ICD-10-CM

## 2010-08-28 NOTE — Assessment & Plan Note (Signed)
Summary: rov to review sleep study   Copy to:  Jason [name] Primary Provider/Referring Provider:  [name] MD  CC:  Follow up to discuss sleep study resutls.  No new complaints. .  History of Present Illness: the pt comes in today for f/u of his recent sleep study.  He was found to have severe osa, with AHI 63/hr and desat less than 50%.  He spent 102 min less than or equal to 88% sat.  I have reviewed the study in detail with him, and answered all of his questions.    Current Medications (verified): 1)  Cardura 4 Mg  Tabs (Doxazosin Mesylate) .... Take One Tab At Bedtime 2)  Tenoretic 50 50-25 Mg  Tabs (Atenolol-Chlorthalidone) .... Take One Tab Every Morning 3)  Allopurinol 300 Mg  Tabs (Allopurinol) .... Once Daily 4)  Colchicine 0.6 Mg  Tabs (Colchicine) .... As Needed 5)  Indomethacin 25 Mg  Caps (Indomethacin) .... Tid 6)  Viagra 100 Mg Tabs (Sildenafil Citrate) .... Uad 7)  Norvasc 10 Mg Tabs (Amlodipine Besylate) .... Take 1 Tablet By Mouth Every Morning 8)  Gabapentin 100 Mg Caps (Gabapentin) .... Take One Tab Three Times A Day  Allergies (verified): No Known Drug Allergies  Review of Systems       The patient complains of irregular heartbeats.  The patient denies shortness of breath with activity, shortness of breath at rest, productive cough, non-productive cough, coughing up blood, chest pain, acid heartburn, indigestion, loss of appetite, weight change, abdominal pain, difficulty swallowing, sore throat, tooth/dental problems, headaches, nasal congestion/difficulty breathing through nose, sneezing, itching, ear ache, anxiety, depression, hand/feet swelling, joint stiffness or pain, rash, change in color of mucus, and fever.    Vital Signs:  Patient profile:   65 year old male Height:      67 inches Weight:      226 pounds BMI:     35.52 O2 Sat:      94 % on Room air Temp:     98.1 degrees F oral Pulse rate:   78 / minute BP sitting:   124 / 76  (right  arm) Cuff size:   large  Vitals Entered By: Raymondo Band RN (August 21, 2010 10:25 AM)  O2 Flow:  Room air CC: Follow up to discuss sleep study resutls.  No new complaints.  Comments Medications reviewed with patient Daytime contact number verified with patient. Jason Jones RN  August 21, 2010 10:25 AM    Physical Exam  General:  obese male in nad Extremities:  mild ankle edema, no cyanosis Neurologic:  awake, but appears sleepy moves all 4 extremities.   Impression & Recommendations:  Problem # 1:  OBSTRUCTIVE SLEEP APNEA (ICD-327.23) the pt has severe osa by his recent sleep study, which puts him at significant CV risk if left untreated.  His insurance apparently covers no sleep apnea related treatment, but will try and see if they will cover cpap.  If not, may have to see about a donated refurbished machine from one of the dme companies.  I have also encouraged the pt to work on weight loss. I will set the patient up on cpap at a moderate pressure level to allow for desensitization, and will troubleshoot the device over the next 4-6weeks if needed.  The pt is to call me if having issues with tolerance.  Will then optimize the pressure once patient is able to wear cpap on a consistent basis.  Other Orders: Est. Patient  Level III [id number]) DME Referral (DME)  Patient Instructions: 1)  will set you up on cpap at a moderate pressure level...understand this is to get you accustomed to the machine, and you will ultimately need more pressure. 2)  work on weight loss 3)  followup with me in 5weeks to check on your progress.

## 2010-09-18 LAB — CREATININE, SERUM
Creatinine, Ser: 1.68 mg/dL — ABNORMAL HIGH (ref 0.4–1.5)
GFR calc Af Amer: 50 mL/min — ABNORMAL LOW (ref >60)
GFR calc non Af Amer: 41 mL/min — ABNORMAL LOW (ref >60)

## 2010-09-18 LAB — BUN: BUN: 12 mg/dL (ref 6–23)

## 2010-09-21 ENCOUNTER — Ambulatory Visit (INDEPENDENT_AMBULATORY_CARE_PROVIDER_SITE_OTHER): Payer: PRIVATE HEALTH INSURANCE | Admitting: Family Medicine

## 2010-09-21 ENCOUNTER — Encounter: Payer: Self-pay | Admitting: Family Medicine

## 2010-09-21 DIAGNOSIS — R609 Edema, unspecified: Secondary | ICD-10-CM

## 2010-09-21 HISTORY — DX: Edema, unspecified: R60.9

## 2010-09-24 NOTE — Assessment & Plan Note (Signed)
Summary: SWOLLEN FOOT   Vital Signs:  Patient profile:   65 year old male Weight:      228.50 pounds Temp:     97.7 degrees F oral Pulse rate:   72 / minute Pulse rhythm:   regular BP sitting:   132 / 82  (left arm) Cuff size:   large  Vitals Entered By: Maudie Mercury Dance CMA (AAMA) (September 21, 2010 10:14 AM) CC: swelling to both feet-no pain   History of Present Illness: Pt here c/o swelling in feet since Wed.  He took Benadryl because he thought it was an allergic rxn and it got better but then the next day they were swollen again. No CP,  no SOB.     Current Medications (verified): 1)  Cardura 4 Mg  Tabs (Doxazosin Mesylate) .... Take One Tab At Bedtime 2)  Tenoretic 50 50-25 Mg  Tabs (Atenolol-Chlorthalidone) .... Take One Tab Every Morning 3)  Allopurinol 300 Mg  Tabs (Allopurinol) .... Once Daily 4)  Colchicine 0.6 Mg  Tabs (Colchicine) .... As Needed 5)  Indomethacin 25 Mg  Caps (Indomethacin) .... Tid 6)  Viagra 100 Mg Tabs (Sildenafil Citrate) .... Uad 7)  Norvasc 10 Mg Tabs (Amlodipine Besylate) .... Take 1 Tablet By Mouth Every Morning 8)  Gabapentin 100 Mg Caps (Gabapentin) .... Take One Tab Three Times A Day 9)  Hydrochlorothiazide 25 Mg Tabs (Hydrochlorothiazide) .Marland Kitchen.. 1 By Mouth Qam  Allergies (verified): No Known Drug Allergies  Past History:  Past medical, surgical, family and social histories (including risk factors) reviewed for relevance to current acute and chronic problems.  Past Medical History: Reviewed history from 05/11/2007 and no changes required. Gout Hypertension ED Colonic polyps, hx of  Past Surgical History: Reviewed history from 07/29/2010 and no changes required. denies surgical history  Family History: Reviewed history from 05/11/2007 and no changes required. Family History Hypertension  Social History: Reviewed history from 07/29/2010 and no changes required. Married and lives with wife, [name] Has children Former Smoker.  started  at age 61.  1/2 pack per week retired.  Alcohol use-yes Drug use-no Regular exercise-yes  Review of Systems      See HPI  Physical Exam  General:  Well-developed,well-nourished,in no acute distress; alert,appropriate and cooperative throughout examination Lungs:  Normal respiratory effort, chest expands symmetrically. Lungs are clear to auscultation, no crackles or wheezes. Heart:  normal rate and no murmur.   Extremities:  trace left pedal edema, left pretibial edema, trace right pedal edema, and right pretibial edema.   No calf  Psych:  Oriented X3 and normally interactive.     Impression & Recommendations:  Problem # 1:  EDEMA (W8805310.3)  His updated medication list for this problem includes:    Tenoretic 50 50-25 Mg Tabs (Atenolol-chlorthalidone) .Marland Kitchen... Take one tab every morning    Hydrochlorothiazide 25 Mg Tabs (Hydrochlorothiazide) .Marland Kitchen... 1 by mouth qam  Discussed elevation of the legs, use of compression stockings, sodium restiction, and medication use.   Complete Medication List: 1)  Cardura 4 Mg Tabs (Doxazosin mesylate) .... Take one tab at bedtime 2)  Tenoretic 50 50-25 Mg Tabs (Atenolol-chlorthalidone) .... Take one tab every morning 3)  Allopurinol 300 Mg Tabs (Allopurinol) .... Once daily 4)  Colchicine 0.6 Mg Tabs (Colchicine) .... As needed 5)  Indomethacin 25 Mg Caps (Indomethacin) .... Tid 6)  Viagra 100 Mg Tabs (Sildenafil citrate) .... Uad 7)  Norvasc 10 Mg Tabs (Amlodipine besylate) .... Take 1 tablet by mouth every morning 8)  Gabapentin 100 Mg Caps (Gabapentin) .... Take one tab three times a day 9)  Hydrochlorothiazide 25 Mg Tabs (Hydrochlorothiazide) .Marland Kitchen.. 1 by mouth qam  Patient Instructions: 1)  Please schedule a follow-up appointment in 2 weeks or sooner as needed  Prescriptions: HYDROCHLOROTHIAZIDE 25 MG TABS (HYDROCHLOROTHIAZIDE) 1 by mouth qam  #30 x 0   Entered and Authorized by:   Garnet Koyanagi DO   Signed by:   Garnet Koyanagi DO on  09/21/2010   Method used:   Electronically to        [facility] (retail)       [address]       [place], [place]  [address]       Ph: [phone]       Fax: [phone]   RxID:   [id number]    Orders Added: 1)  Est. Patient Level III [id number]    Current Allergies (reviewed today): No known allergies

## 2010-10-31 ENCOUNTER — Other Ambulatory Visit: Payer: Self-pay | Admitting: Family Medicine

## 2010-10-31 DIAGNOSIS — I10 Essential (primary) hypertension: Secondary | ICD-10-CM

## 2010-10-31 NOTE — Telephone Encounter (Signed)
Dispense 100 tablets directions one daily refills x 3

## 2010-10-31 NOTE — Telephone Encounter (Signed)
This is Dr. Honor Junes patient, I will forward request to him       KP

## 2010-11-19 NOTE — Assessment & Plan Note (Signed)
[facility]                                 ON-CALL NOTE   NAME:Dworkin, JEWETT SANTANNA                       MRN:          [id number]  DATE:04/29/2007                            DOB:          28-Apr-1946    CALLER:  Roylene Reason.   TIME OF CALL:  April 29, 2007 at 7:41 a.m.   PHONE NUMBER:  [phone].   Dr. Sherren Mocha is the regular doctor, I am Dr. Glori Bickers on call.   CHIEF COMPLAINT:  Bee sting.  The patient has an allergy to bee stings.  He got stung yesterday.  The area that got stung is red and hot.  It is  becoming more swollen as the day goes on.  He took Benadryl twice and  has a knot on the area that is sore.  He has not used ice or heat.  He  is not having any trouble breathing or any tongue swelling, but of note,  once in the past when he was stung, he did have tongue swelling.  I  advised his wife that he should put some ice on the area that was stung,  continue Benadryl, and call Dr. Honor Junes office at 8:30 when it opens to  get an appointment.  Otherwise, if he begins having difficulty  breathing, mouth swelling, or any other symptoms, to go to the emergency  room immediately.     Marne A. Tower, MD  Electronically Signed    MAT/MedQ  DD: 04/29/2007  DT: 04/29/2007  Job #: [id number]   cc:   Dellis Filbert A. Sherren Mocha, MD

## 2010-11-22 NOTE — Op Note (Signed)
[place]  Patient:    Jason [name], Jason [name]                        MRN: [id number] Proc. Date: 10/23/00 Adm. Date:  [id number] Disc. Date: [id number] Attending:  Sheran Luz CC:         [place] (2)   Operative Report  PREOPERATIVE DIAGNOSIS: Laceration of radial digital artery and nerve, left little finger.  POSTOPERATIVE DIAGNOSIS: Laceration of radial digital artery and nerve, left finger.  OPERATION/PROCEDURE: Repair of radial digital artery and nerve, left little finger.  SURGEON: Wynonia Sours, M.D.  ASSISTANT: Odessa Fleming, R.N.  ANESTHESIA: Axillary block.  ANESTHESIOLOGIST: Sharolyn Douglas, M.D.  INDICATIONS FOR PROCEDURE: The patient is a 65 year old male who was attempting to separate frozen meat when the knife slipped and he suffered a laceration to his left little finger.  He was seen in the [facility] Emergency Room, where decreased sensation was noted.  He was referred for definitive care.  DESCRIPTION OF PROCEDURE: The patient was brought to the operating room, where an axillary block was carried out without difficulty.  He was prepped and draped using Betadine scrubbing solution with the left arm free.  The limb was exsanguinated with an Esmarch bandage and the tourniquet placed on the arm and inflated to 250 mm Hg.  The wound was opened and the digital artery and nerve were found to be lacerated.  The wound was extended proximally in an oblique manner and carried down through the subcutaneous tissue.  The flexor tendon was found to be intact without any blood in the sheath, and no laceration to the sheath.  The operative microscope was brought into position and the artery then repaired with interrupted 9-0 Nylon sutures.  The nerve was repaired aligning fascicles with interrupted 9-0 Nylon sutures.  The artery was repaired and a very small portion of the back wall was intact.  The artery  was irrigated and dilated prior to repair.  The wound was closed with interrupted 5-0 Nylon sutures and a sterile compressive dressing and splint to the finger applied.  The patient tolerated the procedure well and was taken to the recovery room for observation in satisfactory condition.  He is discharged home to return to the [facility] in one week, on Vicodin and Keflex. DD:  10/23/00 TD:  10/25/00 Job: [id number]

## 2011-07-07 ENCOUNTER — Encounter (HOSPITAL_COMMUNITY): Payer: Self-pay | Admitting: *Deleted

## 2011-07-07 ENCOUNTER — Telehealth: Payer: Self-pay | Admitting: *Deleted

## 2011-07-07 ENCOUNTER — Ambulatory Visit (INDEPENDENT_AMBULATORY_CARE_PROVIDER_SITE_OTHER): Payer: PRIVATE HEALTH INSURANCE | Admitting: Family Medicine

## 2011-07-07 ENCOUNTER — Emergency Department (INDEPENDENT_AMBULATORY_CARE_PROVIDER_SITE_OTHER)
Admission: EM | Admit: 2011-07-07 | Discharge: 2011-07-07 | Disposition: A | Discharge status: Home / Self Care | Payer: Self-pay | Source: Home / Self Care | Attending: Family Medicine | Admitting: Family Medicine

## 2011-07-07 ENCOUNTER — Encounter: Payer: Self-pay | Admitting: Family Medicine

## 2011-07-07 DIAGNOSIS — N189 Chronic kidney disease, unspecified: Secondary | ICD-10-CM

## 2011-07-07 DIAGNOSIS — R112 Nausea with vomiting, unspecified: Secondary | ICD-10-CM

## 2011-07-07 DIAGNOSIS — J069 Acute upper respiratory infection, unspecified: Secondary | ICD-10-CM

## 2011-07-07 DIAGNOSIS — R509 Fever, unspecified: Secondary | ICD-10-CM

## 2011-07-07 DIAGNOSIS — J111 Influenza due to unidentified influenza virus with other respiratory manifestations: Secondary | ICD-10-CM

## 2011-07-07 DIAGNOSIS — R197 Diarrhea, unspecified: Secondary | ICD-10-CM

## 2011-07-07 LAB — POCT I-STAT, CHEM 8
BUN: 18 mg/dL (ref 6–23)
Chloride: 98 mEq/L (ref 96–112)
Glucose, Bld: 115 mg/dL — ABNORMAL HIGH (ref 70–99)
HCT: 49 % (ref 39.0–52.0)
Hemoglobin: 16.7 g/dL (ref 13.0–17.0)
Sodium: 139 mEq/L (ref 135–145)
TCO2: 32 mmol/L (ref 0–100)

## 2011-07-07 MED ORDER — ONDANSETRON HCL 4 MG PO TABS: 4.0000 mg | ORAL_TABLET | Freq: Four times a day (QID) | ORAL | Status: AC

## 2011-07-07 NOTE — ED Notes (Signed)
Called to waiting area to assess this pt. Pt c/o "diarrhea, vomiting (x1) coughing, body aches, and some dizzy spells since last Saturday". Pt speaks in complete multiple word sentences. Denies CP, SOB, nausea, dizziness at this time. Pt verbalized his understanding to inform staff if his complaint/condition changed.

## 2011-07-07 NOTE — Telephone Encounter (Signed)
Per Dr Sherren Mocha, I spoke with the wife and the patient has had diarrhea for 2 days and "feels warm to touch".  Patient to go to the urgent care at [facility].  Wife agreed.

## 2011-07-07 NOTE — ED Provider Notes (Signed)
History     CSN: [id number]  Arrival date & time 07/07/11  1046   First MD Initiated Contact with Patient 07/07/11 1345      Chief Complaint  Patient presents with  . Dizziness    (Consider location/radiation/quality/duration/timing/severity/associated sxs/prior treatment) Patient is a 66 y.o. male presenting with vomiting. The history is provided by the patient.  Emesis  This is a new problem. The current episode started 2 days ago. Episode frequency: vomited x1 on sat, dairrhea x2 yest, onset sat with chills, today unsteady feeling, no pain. The problem has been gradually improving. The emesis has an appearance of stomach contents. There has been no fever. Associated symptoms include chills, diarrhea and a fever. Pertinent negatives include no abdominal pain, no cough and no URI. Risk factors: no ill contacts.    Past Medical History  Diagnosis Date  . Gout   . Hypertension   . ED (erectile dysfunction)   . Colon polyps     History reviewed. No pertinent past surgical history.  Family History  Problem Relation Age of Onset  . Hypertension Other     History  Substance Use Topics  . Smoking status: Former Research scientist (life sciences)  . Smokeless tobacco: Not on file  . Alcohol Use: Yes      Review of Systems  Constitutional: Positive for fever and chills.  HENT: Negative.   Respiratory: Negative for cough.   Gastrointestinal: Positive for nausea, vomiting and diarrhea. Negative for abdominal pain, blood in stool and abdominal distention.  Skin: Negative.     Allergies  Review of patient's allergies indicates no known allergies.  Home Medications   Current Outpatient Rx  Name Route Sig Dispense Refill  . ALLOPURINOL 300 MG PO TABS Oral Take 300 mg by mouth daily.      Marland Kitchen AMLODIPINE BESYLATE 10 MG PO TABS Oral Take 10 mg by mouth daily.      . ATENOLOL 50 MG PO TABS Oral Take 1 tablet (50 mg total) by mouth daily. 90 tablet 3  . ATENOLOL-CHLORTHALIDONE 50-25 MG PO TABS Oral  Take 1 tablet by mouth daily.      . COLCHICINE 0.6 MG PO TABS Oral Take 0.6 mg by mouth daily.      Marland Kitchen DOXAZOSIN MESYLATE 4 MG PO TABS Oral Take 4 mg by mouth at bedtime.      Marland Kitchen GABAPENTIN 100 MG PO CAPS Oral Take 100 mg by mouth 3 (three) times daily.      Marland Kitchen HYDROCHLOROTHIAZIDE 25 MG PO TABS  TAKE ONE TABLET BY MOUTH EVERY DAY IN THE MORNING 100 tablet 3  . INDOMETHACIN 25 MG PO CAPS Oral Take 25 mg by mouth 3 (three) times daily with meals.      Marland Kitchen ONDANSETRON HCL 4 MG PO TABS Oral Take 1 tablet (4 mg total) by mouth every 6 (six) hours. 8 tablet 0  . SILDENAFIL CITRATE 100 MG PO TABS Oral Take 100 mg by mouth daily as needed.        BP 145/92  Pulse 72  Temp(Src) 99.2 F (37.3 C) (Oral)  Resp 20  SpO2 100%  Physical Exam  Nursing note and vitals reviewed. Constitutional: He is oriented to person, place, and time. He appears well-developed and well-nourished.  HENT:  Head: Normocephalic.  Right Ear: External ear normal.  Left Ear: External ear normal.  Mouth/Throat: Oropharynx is clear and moist.  Eyes: Conjunctivae and EOM are normal. Pupils are equal, round, and reactive to light.  Neck: Normal  range of motion. Neck supple.  Cardiovascular: Normal rate, normal heart sounds and intact distal pulses.   Pulmonary/Chest: Effort normal and breath sounds normal.  Abdominal: Soft. Bowel sounds are normal. He exhibits no mass. There is no tenderness. There is no guarding.  Lymphadenopathy:    He has no cervical adenopathy.  Neurological: He is alert and oriented to person, place, and time.  Skin: Skin is warm and dry.    ED Course  Procedures (including critical care time)  Labs Reviewed  POCT I-STAT, CHEM 8 - Abnormal; Notable for the following:    Creatinine, Ser 2.00 (*)    Glucose, Bld 115 (*)    All other components within normal limits  I-STAT, CHEM 8   No results found.   1. Influenza-like illness   2. CKD (chronic kidney disease)       MDM    i-stat--increased creat.2.0       Pauline Good, MD 07/07/11 [phone]

## 2011-07-07 NOTE — ED Notes (Signed)
Pt has  Symptoms  Of  Being  Dizzy  As  Well  As  Some  abd  Cramping  Diarrhea   Chills       And  The  Fact that  He  Vomited   As  Well   X   2  Days  He  Displays  No  Active  Vomiting  He  Is  Sitting  Comfortably  On the  Exam  Table

## 2011-07-09 ENCOUNTER — Other Ambulatory Visit: Payer: Self-pay | Admitting: *Deleted

## 2011-07-09 MED ORDER — ALLOPURINOL 300 MG PO TABS: 300.0000 mg | ORAL_TABLET | Freq: Every day | ORAL | Status: DC

## 2011-07-10 ENCOUNTER — Telehealth: Payer: Self-pay | Admitting: Family Medicine

## 2011-07-10 NOTE — Telephone Encounter (Signed)
Pt was seen in the ER on 07/07/11 and was told to be seen by his PCP in 3 days. His 3rd day was yesterday, when can pt be worked in for an ER follow up?

## 2011-07-10 NOTE — Telephone Encounter (Signed)
Pt called again to check status of appt. Pt is aware he will be contacted

## 2011-07-11 NOTE — Telephone Encounter (Signed)
Okay to work in next Tuesday or Thursday

## 2011-07-17 ENCOUNTER — Encounter: Payer: Self-pay | Admitting: Family Medicine

## 2011-07-17 ENCOUNTER — Ambulatory Visit (INDEPENDENT_AMBULATORY_CARE_PROVIDER_SITE_OTHER): Payer: Self-pay | Admitting: Family Medicine

## 2011-07-17 DIAGNOSIS — M109 Gout, unspecified: Secondary | ICD-10-CM

## 2011-07-17 DIAGNOSIS — I10 Essential (primary) hypertension: Secondary | ICD-10-CM

## 2011-07-17 LAB — HEPATIC FUNCTION PANEL
ALT: 18 U/L (ref 0–53)
AST: 17 U/L (ref 0–37)
Albumin: 3.5 g/dL (ref 3.5–5.2)
Bilirubin, Direct: 0.2 mg/dL (ref 0.0–0.3)
Total Protein: 6.9 g/dL (ref 6.0–8.3)

## 2011-07-17 LAB — POCT URINALYSIS DIPSTICK
Glucose, UA: NEGATIVE
Leukocytes, UA: NEGATIVE
Nitrite, UA: NEGATIVE
Urobilinogen, UA: 1

## 2011-07-17 LAB — CBC WITH DIFFERENTIAL/PLATELET
Basophils Absolute: 0 10*3/uL (ref 0.0–0.1)
Basophils Relative: 0.6 % (ref 0.0–3.0)
Eosinophils Absolute: 0.2 10*3/uL (ref 0.0–0.7)
Lymphocytes Relative: 26.8 % (ref 12.0–46.0)
MCHC: 32.8 g/dL (ref 30.0–36.0)
Neutrophils Relative %: 59 % (ref 43.0–77.0)
Platelets: 287 10*3/uL (ref 150.0–400.0)
RBC: 4.62 Mil/uL (ref 4.22–5.81)
RDW: 15.2 % — ABNORMAL HIGH (ref 11.5–14.6)

## 2011-07-17 LAB — BASIC METABOLIC PANEL
Calcium: 9.4 mg/dL (ref 8.4–10.5)
Creatinine, Ser: 1.8 mg/dL — ABNORMAL HIGH (ref 0.4–1.5)
GFR: 49.31 mL/min — ABNORMAL LOW (ref >60.00)
Sodium: 137 mEq/L (ref 135–145)

## 2011-07-17 LAB — TSH: TSH: 3.29 u[IU]/mL (ref 0.35–5.50)

## 2011-07-17 LAB — LIPID PANEL
Cholesterol: 215 mg/dL — ABNORMAL HIGH (ref 0–200)
Total CHOL/HDL Ratio: 6
Triglycerides: 145 mg/dL (ref 0.0–149.0)
VLDL: 29 mg/dL (ref 0.0–40.0)

## 2011-07-17 LAB — PSA: PSA: 4.42 ng/mL — ABNORMAL HIGH (ref 0.10–4.00)

## 2011-07-17 LAB — LDL CHOLESTEROL, DIRECT: Direct LDL: 133.6 mg/dL

## 2011-07-17 NOTE — Progress Notes (Signed)
  Subjective:    Patient ID: Jason [name], male    DOB: 1945-10-19, 66 y.o.   MRN: [id number]  HPI Jason [name] is a 66 year old male, who comes in today following an emergency room visit on December the 31st for an episode of vomiting.  He was discharged however, during the evaluation, it was noted.  His creatinine was elevated, and he was advised to come back for follow-up.  His creatinine was 2.0.  Previous creatinine 15 months prior was 1.8.  He states he feels well   Review of Systems    General and cardiovascular and renal review of systems otherwise negative Objective:   Physical Exam  Well-developed well-nourished male, no acute distress      Assessment & Plan:  Hypertension with blood pressure actually too low, 100/70.  Plan discontinue Tenormin decrease the Tenoretic to one half tablet daily.  Stop the hydrochlorothiazide.  BP checked daily.  Return in one week for follow-up

## 2011-07-17 NOTE — Patient Instructions (Signed)
Stop the Tenormin and the hydrochlorothiazide.  Continue the Tenoretic one tablet daily.  Check your blood pressure daily in the morning.  Return in two weeks for follow-up

## 2011-07-31 ENCOUNTER — Ambulatory Visit (INDEPENDENT_AMBULATORY_CARE_PROVIDER_SITE_OTHER): Payer: Self-pay | Admitting: Family Medicine

## 2011-07-31 ENCOUNTER — Encounter: Payer: Self-pay | Admitting: Family Medicine

## 2011-07-31 DIAGNOSIS — N189 Chronic kidney disease, unspecified: Secondary | ICD-10-CM

## 2011-07-31 DIAGNOSIS — I10 Essential (primary) hypertension: Secondary | ICD-10-CM

## 2011-07-31 HISTORY — DX: Chronic kidney disease, unspecified: N18.9

## 2011-07-31 LAB — BASIC METABOLIC PANEL
Calcium: 9.7 mg/dL (ref 8.4–10.5)
Creatinine, Ser: 1.8 mg/dL — ABNORMAL HIGH (ref 0.4–1.5)
GFR: 47.76 mL/min — ABNORMAL LOW (ref >60.00)
Glucose, Bld: 105 mg/dL — ABNORMAL HIGH (ref 70–99)
Sodium: 140 mEq/L (ref 135–145)

## 2011-07-31 NOTE — Progress Notes (Signed)
  Subjective:    Patient ID: Jason [name], male    DOB: 01-Nov-1945, 66 y.o.   MRN: [id number]  Jason [name] is a 66 year old male, who comes in today for follow-up of hypertension, and renal insufficiency.  We saw him a couple weeks ago.  His BUN and creatinine and GFR were abnormal.  Therefore, we stopped the hydrochlorothiazide.  He's back today for follow-up to a takes Tenoretic one tablet daily, along with Norvasc and 4 mg of Cardura.  BP today 150/90    Review of Systems    General and cardiovascular review of systems otherwise negative.  He feels well Objective:   Physical Exam  Well-developed well-nourished man in no acute distress.  BP right arm sitting position 150/90      Assessment & Plan:  Hypertension not at goal.  Recheck beam at because of renal insufficiency.  Increase Cardura to 8 mg daily follow-up in 3 weeks

## 2011-07-31 NOTE — Patient Instructions (Signed)
Increase the Cardura, take 24 mg tablets at bedtime instead of one.  Continue your previous medication.  Check your blood pressure daily in the morning.  Return in 3 weeks for follow-up

## 2011-08-19 ENCOUNTER — Telehealth: Payer: Self-pay | Admitting: Family Medicine

## 2011-08-19 ENCOUNTER — Encounter: Payer: Self-pay | Admitting: Family Medicine

## 2011-08-19 ENCOUNTER — Ambulatory Visit (INDEPENDENT_AMBULATORY_CARE_PROVIDER_SITE_OTHER): Payer: Self-pay | Admitting: Family Medicine

## 2011-08-19 VITALS — BP 120/80 | Temp 98.6°F | Wt 232.0 lb

## 2011-08-19 DIAGNOSIS — I10 Essential (primary) hypertension: Secondary | ICD-10-CM

## 2011-08-19 MED ORDER — DOXAZOSIN MESYLATE 8 MG PO TABS: 8.0000 mg | ORAL_TABLET | Freq: Every day | ORAL | Status: DC

## 2011-08-19 NOTE — Telephone Encounter (Signed)
Patient wants you to call him about his appt. States there is an Insurance underwriter issue, but that he cannot reschedule his appt unless he speaks with you or Dr. Sherren Mocha first. He did not elaborate. Thanks!

## 2011-08-19 NOTE — Telephone Encounter (Signed)
Spoke with patient and an appointment made 

## 2011-08-19 NOTE — Progress Notes (Signed)
  Subjective:    Patient ID: Jason [name], male    DOB: [birth year], 66 y.o.   MRN: [id number]  HPI Jason [name] is a 66 year old male who comes in today for reevaluation of hypertension  We stopped his diuretic and increase the Cardura to 4 mg daily because he was developing renal insufficiency. GFR less than 60 creatinine 1.8 BUN 20 today his blood pressure is 120/80 right arm sitting position. He says his blood pressure home is 150/98. I suspect he has a non-accurate cuff. He'll bring it back today to let us check it.   Review of Systems    general and cardiovascular review of systems otherwise negative Objective:   Physical Exam  Well-developed well-nourished male in no acute distress BP right arm sitting position 120/80 pulse 70 and regular     Assessment & Plan:  Hypertension at goal continue current therapy

## 2011-08-19 NOTE — Patient Instructions (Signed)
Continue your current medication  Cardura is 8 mg daily,,,,,,,,, i emailed  in the 8 mg tablets,,,,,,,,,,, take 2 of the 4 mg tablets until they're gone  Check your blood pressure daily in the morning  Return in 2 months for followup  Bring y  blood pressure cuff back this afternoon I suspect it  is malfunctioning

## 2011-08-21 ENCOUNTER — Ambulatory Visit: Payer: Self-pay | Admitting: Family Medicine

## 2011-09-05 ENCOUNTER — Other Ambulatory Visit: Payer: Self-pay | Admitting: *Deleted

## 2011-09-05 MED ORDER — ATENOLOL-CHLORTHALIDONE 50-25 MG PO TABS: 1.0000 | ORAL_TABLET | Freq: Every day | ORAL | Status: DC

## 2011-09-12 NOTE — Progress Notes (Signed)
  Subjective:    Patient ID: Jason [name], male    DOB: [birth year], 66 y.o.   MRN: [id number]  HPI    Review of Systems     Objective:   Physical Exam        Assessment & Plan:  .

## 2011-10-14 ENCOUNTER — Other Ambulatory Visit: Payer: Self-pay | Admitting: *Deleted

## 2011-10-14 MED ORDER — AMLODIPINE BESYLATE 10 MG PO TABS: 10.0000 mg | ORAL_TABLET | Freq: Every day | ORAL | Status: DC

## 2011-10-20 ENCOUNTER — Ambulatory Visit: Payer: Self-pay | Admitting: Family Medicine

## 2011-11-20 ENCOUNTER — Ambulatory Visit (INDEPENDENT_AMBULATORY_CARE_PROVIDER_SITE_OTHER): Payer: Medicare Other | Admitting: Family Medicine

## 2011-11-20 ENCOUNTER — Encounter: Payer: Self-pay | Admitting: Family Medicine

## 2011-11-20 VITALS — BP 128/80 | Temp 98.4°F | Wt 230.0 lb

## 2011-11-20 DIAGNOSIS — I10 Essential (primary) hypertension: Secondary | ICD-10-CM

## 2011-11-20 NOTE — Progress Notes (Signed)
  Subjective:    Patient ID: Jason [name], male    DOB: [birth year], 66 y.o.   MRN: [id number]  HPI Jason [name] is a 66 year old married male nonsmoker who comes in today for followup of hypertension  He's currently on Norvasc 10 mg daily, Tenoretic 5025, and Cardura 8 mg daily. Blood pressures back to normal 120/80 no side effects from increase in medication   Review of Systems    general and cardiovascular view systems otherwise negative Objective:   Physical Exam Well-developed well-nourished male in no acute distress BP right arm sitting position 120/80       Assessment & Plan:  Hypertension at goal continue current therapy followup CPX when due

## 2011-11-20 NOTE — Patient Instructions (Signed)
Continue your current medications  Set up a time in January for your annual exam  Nonfasting labs one week prior

## 2012-04-20 ENCOUNTER — Other Ambulatory Visit: Payer: Self-pay | Admitting: Family Medicine

## 2012-06-28 ENCOUNTER — Other Ambulatory Visit: Payer: Self-pay | Admitting: *Deleted

## 2012-06-28 MED ORDER — ALLOPURINOL 300 MG PO TABS: 300.0000 mg | ORAL_TABLET | Freq: Every day | ORAL | Status: DC

## 2012-07-12 ENCOUNTER — Other Ambulatory Visit (INDEPENDENT_AMBULATORY_CARE_PROVIDER_SITE_OTHER): Payer: Medicare Other

## 2012-07-12 DIAGNOSIS — R609 Edema, unspecified: Secondary | ICD-10-CM

## 2012-07-12 DIAGNOSIS — I10 Essential (primary) hypertension: Secondary | ICD-10-CM

## 2012-07-12 DIAGNOSIS — N189 Chronic kidney disease, unspecified: Secondary | ICD-10-CM

## 2012-07-12 DIAGNOSIS — D649 Anemia, unspecified: Secondary | ICD-10-CM

## 2012-07-12 DIAGNOSIS — Z0000 Encounter for general adult medical examination without abnormal findings: Secondary | ICD-10-CM

## 2012-07-12 LAB — POCT URINALYSIS DIPSTICK
Bilirubin, UA: NEGATIVE
Glucose, UA: NEGATIVE
Ketones, UA: NEGATIVE
Leukocytes, UA: NEGATIVE
Spec Grav, UA: 1.025

## 2012-07-13 LAB — CBC WITH DIFFERENTIAL/PLATELET
Basophils Absolute: 0.1 10*3/uL (ref 0.0–0.1)
Basophils Relative: 0.6 % (ref 0.0–3.0)
Eosinophils Absolute: 0.3 10*3/uL (ref 0.0–0.7)
HCT: 37.6 % — ABNORMAL LOW (ref 39.0–52.0)
Hemoglobin: 12.2 g/dL — ABNORMAL LOW (ref 13.0–17.0)
Lymphs Abs: 2.7 10*3/uL (ref 0.7–4.0)
MCHC: 32.4 g/dL (ref 30.0–36.0)
Neutro Abs: 6.4 10*3/uL (ref 1.4–7.7)
RBC: 4.41 Mil/uL (ref 4.22–5.81)
RDW: 17.4 % — ABNORMAL HIGH (ref 11.5–14.6)

## 2012-07-13 LAB — HEPATIC FUNCTION PANEL
Alkaline Phosphatase: 89 U/L (ref 39–117)
Bilirubin, Direct: 0.1 mg/dL (ref 0.0–0.3)
Total Protein: 7.7 g/dL (ref 6.0–8.3)

## 2012-07-13 LAB — BASIC METABOLIC PANEL
BUN: 29 mg/dL — ABNORMAL HIGH (ref 6–23)
CO2: 30 mEq/L (ref 19–32)
Calcium: 10.2 mg/dL (ref 8.4–10.5)
Creatinine, Ser: 2.1 mg/dL — ABNORMAL HIGH (ref 0.4–1.5)

## 2012-07-13 LAB — LIPID PANEL: Triglycerides: 181 mg/dL — ABNORMAL HIGH (ref 0.0–149.0)

## 2012-07-13 LAB — TSH: TSH: 3.43 u[IU]/mL (ref 0.35–5.50)

## 2012-07-13 LAB — PSA: PSA: 3.92 ng/mL (ref 0.10–4.00)

## 2012-07-20 ENCOUNTER — Ambulatory Visit (INDEPENDENT_AMBULATORY_CARE_PROVIDER_SITE_OTHER): Payer: Medicare Other | Admitting: Family Medicine

## 2012-07-20 ENCOUNTER — Encounter: Payer: Self-pay | Admitting: Family Medicine

## 2012-07-20 VITALS — BP 130/80 | Temp 97.9°F | Ht 68.0 in | Wt 208.0 lb

## 2012-07-20 DIAGNOSIS — D539 Nutritional anemia, unspecified: Secondary | ICD-10-CM | POA: Insufficient documentation

## 2012-07-20 DIAGNOSIS — D649 Anemia, unspecified: Secondary | ICD-10-CM

## 2012-07-20 DIAGNOSIS — M109 Gout, unspecified: Secondary | ICD-10-CM

## 2012-07-20 DIAGNOSIS — N189 Chronic kidney disease, unspecified: Secondary | ICD-10-CM

## 2012-07-20 DIAGNOSIS — I10 Essential (primary) hypertension: Secondary | ICD-10-CM

## 2012-07-20 DIAGNOSIS — F528 Other sexual dysfunction not due to a substance or known physiological condition: Secondary | ICD-10-CM

## 2012-07-20 DIAGNOSIS — R609 Edema, unspecified: Secondary | ICD-10-CM

## 2012-07-20 DIAGNOSIS — Z23 Encounter for immunization: Secondary | ICD-10-CM

## 2012-07-20 DIAGNOSIS — G4733 Obstructive sleep apnea (adult) (pediatric): Secondary | ICD-10-CM

## 2012-07-20 HISTORY — DX: Nutritional anemia, unspecified: D53.9

## 2012-07-20 MED ORDER — ALLOPURINOL 300 MG PO TABS: 300.0000 mg | ORAL_TABLET | Freq: Every day | ORAL | Status: DC

## 2012-07-20 MED ORDER — AMLODIPINE BESYLATE 10 MG PO TABS: ORAL_TABLET | ORAL | Status: DC

## 2012-07-20 MED ORDER — DOXAZOSIN MESYLATE 8 MG PO TABS: 8.0000 mg | ORAL_TABLET | Freq: Every day | ORAL | Status: DC

## 2012-07-20 MED ORDER — ATENOLOL-CHLORTHALIDONE 50-25 MG PO TABS: 1.0000 | ORAL_TABLET | Freq: Every day | ORAL | Status: DC

## 2012-07-20 NOTE — Patient Instructions (Addendum)
Continue medications except stop the indomethacin  Do not take any over-the-counter aspirin or aspirin products  Take iron one tablet daily at bedtime  Return in 6 weeks for followup  This year get a living will and health care power of attorney for both you and your wife  Call your insurance company to find out where you can get the shingles vaccine

## 2012-07-20 NOTE — Progress Notes (Signed)
  Subjective:    Patient ID: Jason [name], male    DOB: Aug 22, 1945, 67 y.o.   MRN: [id number]  HPI Gerrel is a 67 year old male nonsmoker who comes in today for his first Medicare wellness examination because of a history of gout, hypertension, back pain, erectile dysfunction  His medications reviewed the been no changes. BP today 130/80.  He's been treated for back pain by Dr. Lorin Mercy his orthopedist with Neurontin 100 mg 3 times a day and indomethacin 25 mg 3 times a day  He gets routine eye care, dental care, colonoscopy and GI, vaccinations tetanus 2009, seasonal flu shot 2013, Pneumovax today information given on shingles  Cognitive function normal he walks on a regular basis home health safety reviewed no issues identified, no guns in the house, he does have a health care power of attorney and living well   Review of Systems  Constitutional: Negative.   HENT: Negative.   Eyes: Negative.   Respiratory: Negative.   Cardiovascular: Negative.   Gastrointestinal: Negative.   Genitourinary: Negative.   Musculoskeletal: Negative.   Skin: Negative.   Neurological: Negative.   Hematological: Negative.   Psychiatric/Behavioral: Negative.        Objective:   Physical Exam  Constitutional: He is oriented to person, place, and time. He appears well-developed and well-nourished.  HENT:  Head: Normocephalic and atraumatic.  Right Ear: External ear normal.  Left Ear: External ear normal.  Nose: Nose normal.  Mouth/Throat: Oropharynx is clear and moist.  Eyes: Conjunctivae normal and EOM are normal. Pupils are equal, round, and reactive to light.  Neck: Normal range of motion. Neck supple. No JVD present. No tracheal deviation present. No thyromegaly present.  Cardiovascular: Normal rate, regular rhythm, normal heart sounds and intact distal pulses.  Exam reveals no gallop and no friction rub.   No murmur heard. Pulmonary/Chest: Effort normal and breath sounds normal. No stridor. No  respiratory distress. He has no wheezes. He has no rales. He exhibits no tenderness.  Abdominal: Soft. Bowel sounds are normal. He exhibits no distension and no mass. There is no tenderness. There is no rebound and no guarding.  Genitourinary: Rectum normal, prostate normal and penis normal. Guaiac negative stool. No penile tenderness.  Musculoskeletal: Normal range of motion. He exhibits no edema and no tenderness.  Lymphadenopathy:    He has no cervical adenopathy.  Neurological: He is alert and oriented to person, place, and time. He has normal reflexes. No cranial nerve deficit. He exhibits normal muscle tone.  Skin: Skin is warm and dry. No rash noted. No erythema. No pallor.  Psychiatric: He has a normal mood and affect. His behavior is normal. Judgment and thought content normal.          Assessment & Plan:  Healthy male  Hypertension continue current medications  Gout continue current medication  Him back pain continue Neurontin ,,,,,,,,,,, stop the indomethacin  Iron one tablet daily at bedtime  Followup in 6 weeks  Return in one year sooner if any problems

## 2012-08-31 ENCOUNTER — Ambulatory Visit (INDEPENDENT_AMBULATORY_CARE_PROVIDER_SITE_OTHER): Payer: Medicare Other | Admitting: Family Medicine

## 2012-08-31 ENCOUNTER — Encounter: Payer: Self-pay | Admitting: Family Medicine

## 2012-08-31 VITALS — BP 110/70 | Temp 99.1°F | Wt 200.0 lb

## 2012-08-31 DIAGNOSIS — I10 Essential (primary) hypertension: Secondary | ICD-10-CM

## 2012-08-31 DIAGNOSIS — D649 Anemia, unspecified: Secondary | ICD-10-CM

## 2012-08-31 DIAGNOSIS — N189 Chronic kidney disease, unspecified: Secondary | ICD-10-CM

## 2012-08-31 LAB — CBC WITH DIFFERENTIAL/PLATELET
Basophils Absolute: 0 10*3/uL (ref 0.0–0.1)
Eosinophils Absolute: 0.3 10*3/uL (ref 0.0–0.7)
Lymphocytes Relative: 18.2 % (ref 12.0–46.0)
MCHC: 31.5 g/dL (ref 30.0–36.0)
Monocytes Relative: 12.9 % — ABNORMAL HIGH (ref 3.0–12.0)
Neutro Abs: 5.8 10*3/uL (ref 1.4–7.7)
Neutrophils Relative %: 65.2 % (ref 43.0–77.0)
Platelets: 332 10*3/uL (ref 150.0–400.0)
RDW: 17.9 % — ABNORMAL HIGH (ref 11.5–14.6)

## 2012-08-31 LAB — IRON: Iron: 28 ug/dL — ABNORMAL LOW (ref 42–165)

## 2012-08-31 LAB — FERRITIN: Ferritin: 118.7 ng/mL (ref 22.0–322.0)

## 2012-08-31 LAB — RETICULOCYTES: Retic Ct Pct: 1.3 % (ref 0.4–2.3)

## 2012-08-31 LAB — POCT HEMOGLOBIN: Hemoglobin: 11.6 g/dL — AB (ref 14.1–18.1)

## 2012-08-31 NOTE — Progress Notes (Signed)
  Subjective:    Patient ID: Jason [name], male    DOB: 20-Jul-1945, 67 y.o.   MRN: [id number]  HPI Jason [name] is a 67 year old married male nonsmoker who comes in today for evaluation of hypertension and anemia  We saw him a month ago for general physical examination his hemoglobin had dropped to 12.2. He had been on indomethacin 25 mg 3 times daily for an orthopedic issue. His guaiac was negative. We stopped the indomethacin and all over-the-counter dictations except Tylenol. Today his hemoglobin is 11.6. Again no change in bowel habits  He has had a history of renal insufficiency with his diabetes.  We'll begin workup to define the etiology of his hemoglobin   Review of Systems    review of systems negative bowel movements normal except for slightly greenish color from the iron Objective:   Physical Exam  Well-developed well-nourished male in no acute distress      Assessment & Plan:  Hypertension at goal continue current therapy  Anemia begin workup

## 2012-08-31 NOTE — Patient Instructions (Signed)
Continue your current medications  Tylenol only,,,,,,,, no aspirin products as we discussed  Labs today  We will call you and we gets her lab work back to decide where we go from there

## 2012-10-14 ENCOUNTER — Ambulatory Visit (INDEPENDENT_AMBULATORY_CARE_PROVIDER_SITE_OTHER): Payer: Medicare Other | Admitting: Family Medicine

## 2012-10-14 ENCOUNTER — Encounter: Payer: Self-pay | Admitting: Family Medicine

## 2012-10-14 VITALS — BP 102/70 | Temp 99.0°F | Wt 198.0 lb

## 2012-10-14 DIAGNOSIS — D649 Anemia, unspecified: Secondary | ICD-10-CM

## 2012-10-14 LAB — POCT HEMOGLOBIN: Hemoglobin: 11 g/dL — AB (ref 14.1–18.1)

## 2012-10-14 NOTE — Patient Instructions (Signed)
We will set you up to go to the hospital to get an infusion of IV iron,,,,,,,, followup with me in 4 weeks after your treatment  Stopped the oral iron

## 2012-10-14 NOTE — Progress Notes (Signed)
  Subjective:    Patient ID: Jason [name], male    DOB: [birth year], 67 y.o.   MRN: [id number]  HPIglen is a 67 year old male who comes in today for followup of iron deficiency anemia  A year ago his hemoglobin was normal at 13.6. 3 months ago dropped to 12.2 and we started him on oral iron. He's been taking 2 tabs a day for the past 3 months his hemoglobin is dropped to 11.8. Because he's taking a therapeutic dose of iron and still has an anemia we checked an iron level which was low at 28 he therefore is not absorbing oral iron and we will set him up for an infusion of IV iron    Review of Systems    review of systems otherwise negative Objective:   Physical Exam Well-developed well nourished male no acute distress       Assessment & Plan:  Iron deficiency anemia unresponsive to oral iron plan IV infusion of iron

## 2012-10-21 ENCOUNTER — Other Ambulatory Visit: Payer: Self-pay | Admitting: Family Medicine

## 2012-11-05 NOTE — Progress Notes (Signed)
Confirmed with Jason [name] CMA that Dr. Sherren Mocha definitely wants the Infed test dose and if no reaction to infuse 500mg  as per faxed orders

## 2012-11-08 ENCOUNTER — Encounter (HOSPITAL_COMMUNITY): Payer: Self-pay

## 2012-11-08 ENCOUNTER — Encounter (HOSPITAL_COMMUNITY)
Admission: RE | Admit: 2012-11-08 | Discharge: 2012-11-08 | Disposition: A | Discharge status: Home / Self Care | Payer: Medicare Other | Source: Ambulatory Visit | Attending: Family Medicine | Admitting: Family Medicine

## 2012-11-08 DIAGNOSIS — D649 Anemia, unspecified: Secondary | ICD-10-CM | POA: Insufficient documentation

## 2012-11-08 MED ORDER — SODIUM CHLORIDE 0.9 % IV SOLN
500.0000 mg | Freq: Once | INTRAVENOUS | Status: AC
  Administered 2012-11-08: 500 mg via INTRAVENOUS
  Filled 2012-11-08: qty 10

## 2012-11-08 MED ORDER — IRON DEXTRAN 50 MG/ML IJ SOLN
25.0000 mg | Freq: Once | INTRAMUSCULAR | Status: AC
  Administered 2012-11-08: 25 mg via INTRAVENOUS
  Filled 2012-11-08: qty 0.5

## 2012-11-08 MED ORDER — SODIUM CHLORIDE 0.9 % IV SOLN
Freq: Once | INTRAVENOUS | Status: AC
  Administered 2012-11-08: 20 mL/h via INTRAVENOUS

## 2012-12-06 ENCOUNTER — Ambulatory Visit (INDEPENDENT_AMBULATORY_CARE_PROVIDER_SITE_OTHER): Payer: Medicare Other | Admitting: Family Medicine

## 2012-12-06 ENCOUNTER — Encounter: Payer: Self-pay | Admitting: Family Medicine

## 2012-12-06 VITALS — BP 110/70 | Temp 98.7°F | Wt 196.0 lb

## 2012-12-06 DIAGNOSIS — D649 Anemia, unspecified: Secondary | ICD-10-CM

## 2012-12-06 NOTE — Progress Notes (Signed)
  Subjective:    Patient ID: Jason [name], male    DOB: 1945-09-24, 67 y.o.   MRN: [id number]  HPI Jason [name] is a 67 year old married male nonsmoker who comes in today for follow up of anemia  We found in the winter he was anemic with a low serum iron. No evidence of GI bleed. We took him off aspirin or aspirin products. We gave him oral iron his hemoglobin did not, period a month ago we gave the infusion of IV iron. At that time his hemoglobin was 11 now dropped to 10   Review of Systems    review of systems otherwise negative he feels well bowel movements are normal Objective:   Physical Exam  Well-developed and nourished male no acute distress hemoglobin 10.1      Assessment & Plan:  Anemia unknown etiology check CBC set up GI consult for further evaluation

## 2012-12-06 NOTE — Patient Instructions (Signed)
I will call you about your lab work tomorrow  Continue to avoid all aspirin and aspirin products  Only take Tylenol when necessary

## 2012-12-07 LAB — CBC WITH DIFFERENTIAL/PLATELET
Basophils Relative: 0.4 % (ref 0.0–3.0)
Eosinophils Absolute: 0.4 10*3/uL (ref 0.0–0.7)
HCT: 35.1 % — ABNORMAL LOW (ref 39.0–52.0)
Lymphs Abs: 2.6 10*3/uL (ref 0.7–4.0)
MCHC: 31.3 g/dL (ref 30.0–36.0)
MCV: 87.8 fl (ref 78.0–100.0)
Monocytes Absolute: 1.3 10*3/uL — ABNORMAL HIGH (ref 0.1–1.0)
Neutro Abs: 6.8 10*3/uL (ref 1.4–7.7)
Neutrophils Relative %: 61 % (ref 43.0–77.0)
RBC: 4 Mil/uL — ABNORMAL LOW (ref 4.22–5.81)

## 2012-12-07 LAB — RETICULOCYTES: RBC.: 4.1 MIL/uL — ABNORMAL LOW (ref 4.22–5.81)

## 2012-12-08 ENCOUNTER — Other Ambulatory Visit: Payer: Self-pay | Admitting: Family Medicine

## 2012-12-08 ENCOUNTER — Telehealth: Payer: Self-pay | Admitting: Oncology

## 2012-12-08 DIAGNOSIS — D649 Anemia, unspecified: Secondary | ICD-10-CM

## 2012-12-08 NOTE — Telephone Encounter (Signed)
C/D 12/08/12 for appt. 12/09/12

## 2012-12-08 NOTE — Telephone Encounter (Signed)
S/W PT IN RE NP APPT 06/05 @ 10:30 W/DR. HA REFERRING DR. JEFFREY TODD DX- ANEMIA WELCOME PACKET MAILED.

## 2012-12-09 ENCOUNTER — Ambulatory Visit (HOSPITAL_BASED_OUTPATIENT_CLINIC_OR_DEPARTMENT_OTHER): Payer: Medicare Other | Admitting: Oncology

## 2012-12-09 ENCOUNTER — Telehealth: Payer: Self-pay | Admitting: Oncology

## 2012-12-09 ENCOUNTER — Encounter: Payer: Self-pay | Admitting: Internal Medicine

## 2012-12-09 ENCOUNTER — Other Ambulatory Visit (HOSPITAL_BASED_OUTPATIENT_CLINIC_OR_DEPARTMENT_OTHER): Payer: Medicare Other | Admitting: Lab

## 2012-12-09 ENCOUNTER — Encounter: Payer: Self-pay | Admitting: Oncology

## 2012-12-09 ENCOUNTER — Ambulatory Visit (HOSPITAL_BASED_OUTPATIENT_CLINIC_OR_DEPARTMENT_OTHER): Payer: Medicare Other

## 2012-12-09 VITALS — BP 133/72 | HR 76 | Temp 98.0°F | Resp 18 | Ht 67.0 in | Wt 192.3 lb

## 2012-12-09 DIAGNOSIS — R197 Diarrhea, unspecified: Secondary | ICD-10-CM

## 2012-12-09 DIAGNOSIS — N039 Chronic nephritic syndrome with unspecified morphologic changes: Secondary | ICD-10-CM

## 2012-12-09 DIAGNOSIS — N182 Chronic kidney disease, stage 2 (mild): Secondary | ICD-10-CM

## 2012-12-09 DIAGNOSIS — D649 Anemia, unspecified: Secondary | ICD-10-CM

## 2012-12-09 DIAGNOSIS — D631 Anemia in chronic kidney disease: Secondary | ICD-10-CM

## 2012-12-09 DIAGNOSIS — N289 Disorder of kidney and ureter, unspecified: Secondary | ICD-10-CM

## 2012-12-09 DIAGNOSIS — I129 Hypertensive chronic kidney disease with stage 1 through stage 4 chronic kidney disease, or unspecified chronic kidney disease: Secondary | ICD-10-CM

## 2012-12-09 DIAGNOSIS — R634 Abnormal weight loss: Secondary | ICD-10-CM

## 2012-12-09 LAB — CBC & DIFF AND RETIC
Basophils Absolute: 0 10*3/uL (ref 0.0–0.1)
Eosinophils Absolute: 0.4 10*3/uL (ref 0.0–0.5)
HCT: 34.8 % — ABNORMAL LOW (ref 38.4–49.9)
HGB: 11.2 g/dL — ABNORMAL LOW (ref 13.0–17.1)
MCV: 83.1 fL (ref 79.3–98.0)
MONO%: 10.6 % (ref 0.0–14.0)
NEUT#: 5.7 10*3/uL (ref 1.5–6.5)
RDW: 15.1 % — ABNORMAL HIGH (ref 11.0–14.6)
Retic %: 1.54 % (ref 0.80–1.80)
Retic Ct Abs: 64.53 10*3/uL (ref 34.80–93.90)
lymph#: 2.2 10*3/uL (ref 0.9–3.3)

## 2012-12-09 LAB — COMPREHENSIVE METABOLIC PANEL (CC13)
Albumin: 3.5 g/dL (ref 3.5–5.0)
Alkaline Phosphatase: 98 U/L (ref 40–150)
BUN: 34 mg/dL — ABNORMAL HIGH (ref 7.0–26.0)
Creatinine: 2 mg/dL — ABNORMAL HIGH (ref 0.7–1.3)
Glucose: 96 mg/dl (ref 70–99)
Potassium: 3.5 mEq/L (ref 3.5–5.1)

## 2012-12-09 LAB — LACTATE DEHYDROGENASE (CC13): LDH: 137 U/L (ref 125–245)

## 2012-12-09 NOTE — Telephone Encounter (Signed)
sched pt with Dr. Olevia Perches for Aug 8th @ 2:45am

## 2012-12-09 NOTE — Telephone Encounter (Signed)
gv and printed appt sched and avs for pt  °

## 2012-12-09 NOTE — Progress Notes (Signed)
Checked in new patient. No financial issues. Didn't ask about living will/POA.

## 2012-12-09 NOTE — Patient Instructions (Addendum)
1.  Diagnosis:  Anemia. 2.  Potential causes:  Due to anemia of chronic kidney disease.  I do not think that this is due to iron deficiency since your ferritin (indicating iron store) was normal.  I need to rule out multiple myeloma.  Make sure that GI work up is up to date.  3.  Treatment for anemia of chronic kidney disease:  *  Maintain kidney function with control of hypertension.  *  If anemia significantly worsens, then we may consider bone marrow biopsy to rule out occult bone marrow failure process.  *  If work up is negative, then the diagnosis of exclusion is anemia of chronic kidney disease (there is not test to confirm this).  If patients require frequent blood transfusion, we may consider Aranesp injection to decrease need of frequent blood transfusion.  However, there is slightly increase in risk of stroke and heart attack with Aranesp.  Therefore, I do not personally recommend Aranesp right away.  4.  Follow up:  Lab only appointment in about 3 months.  Return visit in about 6 months.

## 2012-12-09 NOTE — Progress Notes (Signed)
[place]  [phone] [phone] [phone]    Referral MD:  Jory Ee. Sherren Mocha, M.D.  Reason for Referral: anemia.     HPI:  [name] is a 67 year-old man with HTN, CKD.  He had a negative colonoscopy in 2009.  He has been having normocytic anemia over the last year.  His iron was low, but ferritin was normal in 2/104.  He was thought to have iron deficiency anemia.  Dr. Sherren Mocha arranged for patient to receive IV iron with short stay in 11/2012 without significant improvement of his anemia.  He was thus kindly referred to the [facility] for evaluation.  Jason [name] presented to the clinic today for the first time with his wife.  His main complaint is 5-8x/day diarrhea for the last 6 months.  He has lost about 30-lb nonintentionally.  His diarrhea is moderate in volume without fever, abdominal cramp, hematochezia, melena.  There is no relation with type of foods and diarrhea.  He has mild fatigue; however, he is still independent of all activities of daily living compared to before.   Patient denies fever, anorexia, headache, visual changes, confusion, drenching night sweats, palpable lymph node swelling, mucositis, odynophagia, dysphagia, nausea vomiting, jaundice, chest pain, palpitation, shortness of breath, dyspnea on exertion, productive cough, gum bleeding, epistaxis, hematemesis, hemoptysis, abdominal pain, abdominal swelling, early satiety, melena, hematochezia, hematuria, skin rash, spontaneous bleeding, joint swelling, joint pain, heat or cold intolerance, bowel bladder incontinence, back pain, focal motor weakness, paresthesia, depression.      Past Medical History  Diagnosis Date  . Gout   . Hypertension   . ED (erectile dysfunction)   . Colon polyps   . Chronic renal insufficiency, stage II (mild)   . Diverticulosis   :    Past Surgical History  Procedure Laterality Date  . Colonoscopy  2010   [facility]  :   CURRENT MEDS: Current Outpatient Prescriptions  Medication Sig Dispense Refill  . allopurinol (ZYLOPRIM) 300 MG tablet Take 1 tablet (300 mg total) by mouth daily.  90 tablet  3  . amLODipine (NORVASC) 10 MG tablet 1 by mouth every morning  90 tablet  3  . atenolol-chlorthalidone (TENORETIC) 50-25 MG per tablet Take 1 tablet by mouth daily.  90 tablet  3  . doxazosin (CARDURA) 8 MG tablet Take 1 tablet (8 mg total) by mouth at bedtime.  100 tablet  3  . gabapentin (NEURONTIN) 100 MG capsule Take 100 mg by mouth 3 (three) times daily.         No current facility-administered medications for this visit.      No Known Allergies:  Family History  Problem Relation Age of Onset  . Hypertension Other   . Stroke Mother   . Stroke Father   :  History   Social History  . Marital Status: Married    Spouse Name: N/A    Number of Children: 2  . Years of Education: N/A   Occupational History  .      retired Insurance account manager   Social History Main Topics  . Smoking status: Former Smoker -- 0.50 packs/day for 10 years    Quit date: 07/07/1977  . Smokeless tobacco: Never Used  . Alcohol Use: Yes  . Drug Use: No  . Sexually Active: Not on file   Other Topics Concern  . Not on file   Social History Narrative  . No  narrative on file  :  REVIEW OF SYSTEM:  The rest of the 14-point review of sytem was negative.   Exam: ECOG 0-1.   General:  well-nourished man, in no acute distress.  Eyes:  no scleral icterus.  ENT:  There were no oropharyngeal lesions.  Neck was without thyromegaly.  Lymphatics:  Negative cervical, supraclavicular or axillary adenopathy.  Respiratory: lungs were clear bilaterally without wheezing or crackles.  Cardiovascular:  Regular rate and rhythm, S1/S2, without murmur, rub or gallop.  There was no pedal edema.  GI:  abdomen was soft, flat, nontender, nondistended, without organomegaly.  Muscoloskeletal:  no spinal tenderness of palpation of  vertebral spine.  Skin exam was without echymosis, petichae.  Neuro exam was nonfocal.  Patient was able to get on and off exam table without assistance.  Gait was normal.  Patient was alert and oriented.  Attention was good.   Language was appropriate.  Mood was normal without depression.  Speech was not pressured.  Thought content was not tangential.    LABS:  Lab Results  Component Value Date   WBC 9.3 12/09/2012   HGB 11.2* 12/09/2012   HCT 34.8* 12/09/2012   PLT 323 12/09/2012   GLUCOSE 96 12/09/2012   CHOL 220* 07/12/2012   TRIG 181.0* 07/12/2012   HDL 34.40* 07/12/2012   LDLDIRECT 149.8 07/12/2012   LDLCALC 131* 05/29/2010   ALT 7 12/09/2012   AST 9 12/09/2012   NA 142 12/09/2012   K 3.5 12/09/2012   CL 101 12/09/2012   CREATININE 2.0* 12/09/2012   BUN 34.0* 12/09/2012   CO2 31* 12/09/2012   PSA 3.92 07/12/2012   HGBA1C 6.0 12/20/2009    Blood smear review:   I personally reviewed the patient's peripheral blood smear today.  There was isocytosis.  There was no peripheral blast.  There was no schistocytosis, spherocytosis, target cell, rouleaux formation, tear drop cell.  There was no giant platelets or platelet clumps.      ASSESSMENT AND PLAN:   1.  HTN:  Well controlled on amlodipine, Tenoretic.  2.  Chronic kidney disease:  Stage II;  Most likely due to HTN.  I will rule out myeloma today.  3.  Diagnosis:  Anemia. - Potential causes:  Due to anemia of chronic kidney disease.  I do not think that this is due to iron deficiency since his ferritin was normal and his anemia did not improve with IV iron.  I need to rule out multiple myeloma.   - Treatment for anemia of chronic kidney disease:  *  Maintain kidney function with control of hypertension.  *  If anemia significantly worsens, then we may consider bone marrow biopsy to rule out occult bone marrow failure process (such as MDS, pure red cell aplasia).   *  If work up is negative, then the diagnosis of exclusion is anemia of chronic kidney disease  .  If patients require frequent blood transfusion, we may consider Aranesp injection to decrease need of frequent blood transfusion.  However, there is slightly increase in risk of stroke and heart attack with Aranesp.  Therefore, I do not personally recommend Aranesp right away.   4.  Diarrhea and weight loss:  Query malabsorption problem vs inflammatory bowel disease.  I referred him to Dr. Delfin Edis from GI for evaluation.  5.  Follow up:  Lab only appointment in about 2 and 4 months at the [facility].  Return visit in about 6 months.   I  informed Jason [name] and his wife that I am leaving the practice. The Cancer center will arrange for him to see a new provider when he returns.     The length of time of the face-to-face encounter was 45 minutes. More than 50% of time was spent counseling and coordination of care.     Thank you for this referral.

## 2012-12-13 LAB — KAPPA/LAMBDA LIGHT CHAINS
Kappa:Lambda Ratio: 0.99 (ref 0.26–1.65)
Lambda Free Lght Chn: 3.93 mg/dL — ABNORMAL HIGH (ref 0.57–2.63)

## 2012-12-13 LAB — PROTEIN ELECTROPHORESIS, SERUM
Albumin ELP: 50.9 % — ABNORMAL LOW (ref 55.8–66.1)
Alpha-1-Globulin: 5.9 % — ABNORMAL HIGH (ref 2.9–4.9)
Alpha-2-Globulin: 15.9 % — ABNORMAL HIGH (ref 7.1–11.8)
Beta Globulin: 5.5 % (ref 4.7–7.2)
Total Protein, Serum Electrophoresis: 7.5 g/dL (ref 6.0–8.3)

## 2012-12-15 ENCOUNTER — Telehealth: Payer: Self-pay | Admitting: Dietician

## 2012-12-19 ENCOUNTER — Encounter: Payer: Self-pay | Admitting: Oncology

## 2012-12-20 ENCOUNTER — Telehealth: Payer: Self-pay | Admitting: *Deleted

## 2012-12-20 NOTE — Telephone Encounter (Signed)
Please read him the result letter that I sent today.  Thanks.

## 2012-12-20 NOTE — Telephone Encounter (Signed)
Pt left Vm asking about his lab results from 12/09/12?

## 2012-12-20 NOTE — Telephone Encounter (Signed)
Read pt Dr. Agustina Caroli letter on the phone, instructed him to keep his lab appt as scheduled on 02/04/13.  He verbalized understanding.

## 2012-12-27 ENCOUNTER — Telehealth: Payer: Self-pay | Admitting: *Deleted

## 2012-12-27 NOTE — Telephone Encounter (Signed)
Pt left VM asking if Dr. Lamonte Sakai will prescribe him something for diarrhea.  Called pt back and left message with a girl who said he was unavailable.  Asked her to have pt return our call.

## 2012-12-28 ENCOUNTER — Telehealth: Payer: Self-pay | Admitting: Family Medicine

## 2012-12-28 ENCOUNTER — Telehealth: Payer: Self-pay | Admitting: *Deleted

## 2012-12-28 NOTE — Telephone Encounter (Signed)
Pt asking if Dr. Lamonte Sakai will prescribe him something for his diarrhea?  Instructed pt to call his PCP for the diarrhea and to keep his GI appt as scheduled in August, but meanwhile definitely call PCP for suggestions or prescriptions to manage his diarrhea.  Informed him Dr. Lamonte Sakai seeing him for anemia. He verbalized understanding.

## 2012-12-28 NOTE — Telephone Encounter (Signed)
Spoke with patient.

## 2012-12-28 NOTE — Telephone Encounter (Signed)
Pt called and requested to speak with you. He would disclose no further details. Please assist.

## 2012-12-29 ENCOUNTER — Encounter: Payer: Self-pay | Admitting: *Deleted

## 2012-12-29 ENCOUNTER — Telehealth: Payer: Self-pay | Admitting: Internal Medicine

## 2012-12-29 NOTE — Telephone Encounter (Signed)
Pts appt moved up to 12/30/12@3 :30pm. Dr. Honor Junes office to notify pt of appt date and time.

## 2012-12-29 NOTE — Telephone Encounter (Signed)
Patient is aware of appointment.  Thanks GI!

## 2012-12-30 ENCOUNTER — Encounter: Payer: Self-pay | Admitting: Internal Medicine

## 2012-12-30 ENCOUNTER — Ambulatory Visit (INDEPENDENT_AMBULATORY_CARE_PROVIDER_SITE_OTHER): Payer: Medicare Other | Admitting: Internal Medicine

## 2012-12-30 VITALS — BP 124/70 | HR 74 | Ht 67.0 in | Wt 193.5 lb

## 2012-12-30 DIAGNOSIS — R197 Diarrhea, unspecified: Secondary | ICD-10-CM

## 2012-12-30 MED ORDER — METRONIDAZOLE 250 MG PO TABS: 250.0000 mg | ORAL_TABLET | Freq: Three times a day (TID) | ORAL | Status: DC

## 2012-12-30 NOTE — Patient Instructions (Addendum)
Your physician has requested that you go to the basement for the following lab work before leaving today: Sed Rate, TSH, Stool Culture, C Diff, Lactoferrin  We have sent the following medications to your pharmacy for you to pick up at your convenience: Flagyl  Cc: Dr Stevie Kern, Dr Lamonte Sakai

## 2012-12-30 NOTE — Progress Notes (Signed)
Jason ZACHRY Sr. May 27, 1946 MRN [id number]   History of Present Illness:  This is a 67 year old African American male with anemia and diarrhea, having loose stools 5-8 times a day for the past 3 or 4 weeks. There has been a semi intentional weight loss of about 30 pounds. He denies rectal bleeding or abdominal pain. He has renal insufficiency. His last colonoscopy in 2009 showed mild diverticulosis and small hyperplastic polyps. He saw Dr.Ha 2 days ago for evaluation of anemia and was told to take Imodium one a day which he did with complete resolution of his diarrhea. He had only one formed stool yesterday and no stool today. He is on Cardura, Neurontin, Tenorectic, Norvasc and Zyloprim.    Past Medical History  Diagnosis Date  . Gout   . Hypertension   . ED (erectile dysfunction)   . Hyperplastic colon polyp   . Chronic renal insufficiency, stage II (mild)   . Diverticulosis   . Anemia   . Diabetes    Past Surgical History  Procedure Laterality Date  . Colonoscopy  2010    [facility]    reports that he quit smoking about 35 years ago. He has never used smokeless tobacco. He reports that  drinks alcohol. He reports that he does not use illicit drugs. family history includes Hypertension in his other and Stroke in his father and mother. No Known Allergies      Review of Systems:  The remainder of the 10 point ROS is negative except as outlined in H&P   Physical Exam: General appearance  Well developed, in no distress. Eyes- non icteric. HEENT nontraumatic, normocephalic. Mouth no lesions, tongue papillated, no cheilosis. Neck supple without adenopathy, thyroid not enlarged, no carotid bruits, no JVD. Lungs Clear to auscultation bilaterally. Cor normal S1, normal S2, regular rhythm, no murmur,  quiet precordium. Abdomen: Tubular and soft, nontender. No palpable mass. No ascites. Quiet bowel sounds. Rectal:Nodes done.  Extremities no pedal edema. Skin no  lesions. Neurological alert and oriented x 3. Psychological normal mood and affect.  Assessment and Plan:  Problem #24 67 year old [name] with acute diarrheal illness which has corrected itself since he has taken a total of 2 Imodium in the last 2 days. The etiology of the diarrhea is not clear. I am concerned that the Imodium has not stopped the diarrhea but only masked the symptoms. For this reason, I would still like to obtain a stool culture, lactoferrin, and C. difficile. We will give him an empirical trial of Flagyl 250 mg by mouth 3 times a day. He is going to call us in one week with an update. If the diarrhea continues, I would consider a flexible sigmoidoscopy to rule out microscopic colitis.   12/30/2012 Delfin Edis

## 2013-01-03 ENCOUNTER — Other Ambulatory Visit: Payer: Medicare Other

## 2013-01-03 DIAGNOSIS — R197 Diarrhea, unspecified: Secondary | ICD-10-CM

## 2013-01-05 LAB — CLOSTRIDIUM DIFFICILE BY PCR: Toxigenic C. Difficile by PCR: NOT DETECTED

## 2013-01-07 LAB — STOOL CULTURE

## 2013-01-12 ENCOUNTER — Other Ambulatory Visit: Payer: Self-pay | Admitting: Internal Medicine

## 2013-01-13 ENCOUNTER — Other Ambulatory Visit: Payer: Self-pay | Admitting: Internal Medicine

## 2013-01-13 ENCOUNTER — Telehealth: Payer: Self-pay | Admitting: Internal Medicine

## 2013-01-13 DIAGNOSIS — R195 Other fecal abnormalities: Secondary | ICD-10-CM

## 2013-01-13 MED ORDER — METRONIDAZOLE 250 MG PO TABS: ORAL_TABLET | ORAL | Status: DC

## 2013-01-13 NOTE — Telephone Encounter (Signed)
I am not sure from his description whether he is still having "flaky" diarrhea. sounds like he is better but not well.The plan was to do a flexible sigm to r/o microscopic colitis. Also, please, refill Flagyl 250 tid x 1 week.

## 2013-01-13 NOTE — Telephone Encounter (Signed)
Spoke with patient and told him we did not fill the Flagyl because he was to call with update and possibly schedule a procedure if he continued with diarrhea. Patient states he no longer has diarrhea. He states since Sunday, his stool has been like "crumbs in the toilet." States the "crumbs" are soft pieces of stool. Denies bleeding. Please, advise.

## 2013-01-13 NOTE — Telephone Encounter (Signed)
Spoke with patient and gave him the recommendations. Rx sent. Scheduled for flex sig on 01/21/13 at 9:00/10:00 AM. Pre visit on 01/14/13 at 8:30 AM.

## 2013-01-14 ENCOUNTER — Encounter: Payer: Self-pay | Admitting: Internal Medicine

## 2013-01-14 ENCOUNTER — Ambulatory Visit (AMBULATORY_SURGERY_CENTER): Payer: Medicare Other | Admitting: *Deleted

## 2013-01-14 VITALS — Ht 67.0 in | Wt 190.4 lb

## 2013-01-14 DIAGNOSIS — R197 Diarrhea, unspecified: Secondary | ICD-10-CM

## 2013-01-14 NOTE — Progress Notes (Signed)
No egg or soy allergy. No anesthesia problems/ no prior surgeries.

## 2013-01-21 ENCOUNTER — Ambulatory Visit (AMBULATORY_SURGERY_CENTER): Payer: Medicare Other | Admitting: Internal Medicine

## 2013-01-21 ENCOUNTER — Other Ambulatory Visit: Payer: Self-pay | Admitting: *Deleted

## 2013-01-21 ENCOUNTER — Ambulatory Visit (INDEPENDENT_AMBULATORY_CARE_PROVIDER_SITE_OTHER): Payer: Medicare Other

## 2013-01-21 ENCOUNTER — Encounter: Payer: Self-pay | Admitting: Internal Medicine

## 2013-01-21 ENCOUNTER — Telehealth: Payer: Self-pay | Admitting: *Deleted

## 2013-01-21 VITALS — BP 124/81 | HR 84 | Temp 98.6°F | Resp 15 | Ht 67.0 in | Wt 190.0 lb

## 2013-01-21 DIAGNOSIS — R197 Diarrhea, unspecified: Secondary | ICD-10-CM

## 2013-01-21 DIAGNOSIS — R198 Other specified symptoms and signs involving the digestive system and abdomen: Secondary | ICD-10-CM

## 2013-01-21 DIAGNOSIS — D126 Benign neoplasm of colon, unspecified: Secondary | ICD-10-CM

## 2013-01-21 DIAGNOSIS — K6289 Other specified diseases of anus and rectum: Secondary | ICD-10-CM

## 2013-01-21 LAB — COMPREHENSIVE METABOLIC PANEL
Albumin: 3.5 g/dL (ref 3.5–5.2)
BUN: 17 mg/dL (ref 6–23)
Calcium: 9.5 mg/dL (ref 8.4–10.5)
Chloride: 98 mEq/L (ref 96–112)
Glucose, Bld: 105 mg/dL — ABNORMAL HIGH (ref 70–99)
Potassium: 3.5 mEq/L (ref 3.5–5.1)
Sodium: 136 mEq/L (ref 135–145)
Total Protein: 7.4 g/dL (ref 6.0–8.3)

## 2013-01-21 LAB — CBC WITH DIFFERENTIAL/PLATELET
Basophils Relative: 0.4 % (ref 0.0–3.0)
Eosinophils Relative: 1.2 % (ref 0.0–5.0)
Lymphocytes Relative: 14 % (ref 12.0–46.0)
MCV: 86.3 fl (ref 78.0–100.0)
Monocytes Relative: 9.2 % (ref 3.0–12.0)
Neutrophils Relative %: 75.2 % (ref 43.0–77.0)
Platelets: 337 10*3/uL (ref 150.0–400.0)
RBC: 4.05 Mil/uL — ABNORMAL LOW (ref 4.22–5.81)
WBC: 10.7 10*3/uL — ABNORMAL HIGH (ref 4.5–10.5)

## 2013-01-21 LAB — PROTIME-INR
INR: 1.3 ratio — ABNORMAL HIGH (ref 0.8–1.0)
Prothrombin Time: 13.4 s — ABNORMAL HIGH (ref 10.2–12.4)

## 2013-01-21 MED ORDER — SODIUM CHLORIDE 0.9 % IV SOLN: 500.0000 mL | INTRAVENOUS | Status: DC

## 2013-01-21 NOTE — Patient Instructions (Addendum)
Discharge instructions given with verbal understanding. Handouts on polyps and diverticulosis given. CT CONTRAST given in recovery room. Office will schedule follow up appointment. Will send to labs upon discharge. YOU HAD AN ENDOSCOPIC PROCEDURE TODAY AT [facility]: Refer to the procedure report that was given to you for any specific questions about what was found during the examination.  If the procedure report does not answer your questions, please call your gastroenterologist to clarify.  If you requested that your care partner not be given the details of your procedure findings, then the procedure report has been included in a sealed envelope for you to review at your convenience later.  YOU SHOULD EXPECT: Some feelings of bloating in the abdomen. Passage of more gas than usual.  Walking can help get rid of the air that was put into your GI tract during the procedure and reduce the bloating. If you had a lower endoscopy (such as a colonoscopy or flexible sigmoidoscopy) you may notice spotting of blood in your stool or on the toilet paper. If you underwent a bowel prep for your procedure, then you may not have a normal bowel movement for a few days.  DIET: Your first meal following the procedure should be a light meal and then it is ok to progress to your normal diet.  A half-sandwich or bowl of soup is an example of a good first meal.  Heavy or fried foods are harder to digest and may make you feel nauseous or bloated.  Likewise meals heavy in dairy and vegetables can cause extra gas to form and this can also increase the bloating.  Drink plenty of fluids but you should avoid alcoholic beverages for 24 hours.  ACTIVITY: Your care partner should take you home directly after the procedure.  You should plan to take it easy, moving slowly for the rest of the day.  You can resume normal activity the day after the procedure however you should NOT DRIVE or use heavy machinery for 24 hours  (because of the sedation medicines used during the test).    SYMPTOMS TO REPORT IMMEDIATELY: A gastroenterologist can be reached at any hour.  During normal business hours, 8:30 AM to 5:00 PM Monday through Friday, call [phone].  After hours and on weekends, please call the GI answering service at [phone] who will take a message and have the physician on call contact you.   Following lower endoscopy (colonoscopy or flexible sigmoidoscopy):  Excessive amounts of blood in the stool  Significant tenderness or worsening of abdominal pains  Swelling of the abdomen that is new, acute  Fever of 100F or higher  FOLLOW UP: If any biopsies were taken you will be contacted by phone or by letter within the next 1-3 weeks.  Call your gastroenterologist if you have not heard about the biopsies in 3 weeks.  Our staff will call the home number listed on your records the next business day following your procedure to check on you and address any questions or concerns that you may have at that time regarding the information given to you following your procedure. This is a courtesy call and so if there is no answer at the home number and we have not heard from you through the emergency physician on call, we will assume that you have returned to your regular daily activities without incident.  SIGNATURES/CONFIDENTIALITY: You and/or your care partner have signed paperwork which will be entered into your electronic medical record.  These signatures attest to the fact that that the information above on your After Visit Summary has been reviewed and is understood.  Full responsibility of the confidentiality of this discharge information lies with you and/or your care-partner.

## 2013-01-21 NOTE — Progress Notes (Signed)
Pt. Stated that his stool appears clear green with a few fecal flakes after prep.  Dr. Olevia Perches advised.  No additional enemas to be given.

## 2013-01-21 NOTE — Telephone Encounter (Signed)
Per Dr. Olevia Perches,  Patient needs CT abd, pelvis with contrast, CBC, PT,Cmet, CEA.Labs in EPIC. OV on 01/25/13 at 9:30 AM/. Scheduled CT ar [place] [facility]) on 01/24/13 at 2:00 PM. Drink contrast one and two hours prior. NPO 4 hours prior. Celia in [facility] recovery given information to give patient.

## 2013-01-21 NOTE — Progress Notes (Signed)
Called to room to assist during endoscopic procedure.  Patient ID and intended procedure confirmed with present staff. Received instructions for my participation in the procedure from the performing physician.  

## 2013-01-21 NOTE — Op Note (Signed)
[facility]  [address] [place], [address]   FLEX SIGMOIDOSCOPY PROCEDURE REPORT  PATIENT: Jason [name], Jason [name]  MR#: [id number] BIRTHDATE: 11-May-1946 , 67  yrs. old GENDER: Male ENDOSCOPIST: Lafayette Dragon, MD REFERRED [id number] Delora Fuel, M.D. PROCEDURE DATE:  01/21/2013 PROCEDURE:   Sigmoidoscopy with biopsy  ,and snare polypectomy INDICATIONS:unexplained diarrhea.  ,weight loss,stool studies negative, diarrhea has subsided in last 2 weeks after Fl;agyl Rx, hx of adenomatous polyps, 2001, last colon 04/2008 MEDICATIONS: MAC sedation, administered by CRNA and propofol (Diprivan) 350mg  IV  DESCRIPTION OF PROCEDURE:    Physical exam was performed.  Informed consent was obtained from the patient after explaining the benefits, risks, and alternatives to procedure.  The patient was connected to monitor and placed in left lateral position. Continuous oxygen was provided by nasal cannula and IV medicine administered through an indwelling cannula.  After administration of sedation and rectal exam, the patients rectum was intubated and the LB [id number]  colonoscope was advanced under direct visualization to the cecum.  The scope was removed slowly by carefully examining the color, texture, anatomy, and integrity mucosa on the way out.  The patient was recovered in endoscopy and discharged home in satisfactory condition.       COLON FINDINGS: Two smooth sessile polyps ranging between 3-43mm in size were found in the descending colon.  A polypectomy was performed with cold forceps and with a cold snare.  The resection was complete and the polyp tissue was completely retrieved, There was mild diverticulosis noted in the sigmoid colon with associated muscular hypertrophy.  , and A circumferential fungating and ulcerated mass with friable surfaces was found in the rectum.atarting at the anal canal and extending to 10 cm, where it becomes circumferential. It is not  obstructing. SPOT tatoo applied to the proximal margin of the mass at 10 cm.  Multiple biopsies were performed using cold forceps.  Unable to retroflex due to rectal mass  PREP QUALITY: good CECAL W/D TIME:  COMPLICATIONS: None  ENDOSCOPIC IMPRESSION: 1.   Two sessile polyps ranging between 3-50mm in size were found in the descending colon; polypectomy was performed with cold forceps and with a cold snare 2.   There was mild diverticulosis noted in the sigmoid colon 3.   Circumferential mass were found in the rectum; multiple biopsies were performed using cold forceps  0-10 cm, friable c/w carcinoma, SPOT tatoo applied at the proximal margin at 10 cm   RECOMMENDATIONS: await biopsy results CT scan of the abd. and pelvis, EUS, OV in 3-4 days when biopsies available Labs CBC Cmet CEA,PT       _______________________________ eSignedLafayette Dragon, MD 01/21/2013 11:03 AM     PATIENT NAME:  Jason [name] MR#: [id number]

## 2013-01-24 ENCOUNTER — Telehealth: Payer: Self-pay

## 2013-01-24 ENCOUNTER — Encounter: Payer: Self-pay | Admitting: Internal Medicine

## 2013-01-24 ENCOUNTER — Ambulatory Visit (INDEPENDENT_AMBULATORY_CARE_PROVIDER_SITE_OTHER)
Admission: RE | Admit: 2013-01-24 | Discharge: 2013-01-24 | Disposition: A | Discharge status: Home / Self Care | Payer: Medicare Other | Source: Ambulatory Visit | Attending: Internal Medicine | Admitting: Internal Medicine

## 2013-01-24 DIAGNOSIS — R198 Other specified symptoms and signs involving the digestive system and abdomen: Secondary | ICD-10-CM

## 2013-01-24 DIAGNOSIS — K6289 Other specified diseases of anus and rectum: Secondary | ICD-10-CM

## 2013-01-24 MED ORDER — IOHEXOL 300 MG/ML  SOLN
100.0000 mL | Freq: Once | INTRAMUSCULAR | Status: AC | PRN
  Administered 2013-01-24: 100 mL via INTRAVENOUS

## 2013-01-24 NOTE — Telephone Encounter (Signed)
  Follow up Call-  Call back number 01/21/2013  Post procedure Call Back phone  # [phone]  Permission to leave phone message Yes     Patient questions:  Do you have a fever, pain , or abdominal swelling? no Pain Score  0 *  Have you tolerated food without any problems? yes  Have you been able to return to your normal activities? yes  Do you have any questions about your discharge instructions: Diet   no Medications  no Follow up visit  no  Do you have questions or concerns about your Care? no  Actions: * If pain score is 4 or above: No action needed, pain <4.

## 2013-01-25 ENCOUNTER — Telehealth: Payer: Self-pay | Admitting: Oncology

## 2013-01-25 ENCOUNTER — Encounter: Payer: Self-pay | Admitting: Internal Medicine

## 2013-01-25 ENCOUNTER — Telehealth: Payer: Self-pay | Admitting: Internal Medicine

## 2013-01-25 ENCOUNTER — Ambulatory Visit (INDEPENDENT_AMBULATORY_CARE_PROVIDER_SITE_OTHER): Payer: Medicare Other | Admitting: Internal Medicine

## 2013-01-25 VITALS — BP 100/68 | HR 88 | Ht 67.0 in | Wt 189.6 lb

## 2013-01-25 DIAGNOSIS — C20 Malignant neoplasm of rectum: Secondary | ICD-10-CM

## 2013-01-25 DIAGNOSIS — R933 Abnormal findings on diagnostic imaging of other parts of digestive tract: Secondary | ICD-10-CM

## 2013-01-25 DIAGNOSIS — R195 Other fecal abnormalities: Secondary | ICD-10-CM

## 2013-01-25 NOTE — Telephone Encounter (Signed)
[name] has been advised that patient is not having another flexible sigmoidoscopy, he is actually having an endoscopic ultrasound. However, he is using the same instructions as he would for flexible sigmoidoscopy. She verbalizes understanding.

## 2013-01-25 NOTE — Progress Notes (Signed)
Jason [name] Sr. August 13, 1945 MRN [id number]  History of Present Illness:  This is a 67 year old African American male with a new diagnosis of locally advanced rectal carcinoma starting around 8 cm from the anal canal and extending to at least 10-12 cm. It was found on a flexible sigmoidoscopy which was done for evaluation of diarrhea. A spot tattoo was applied to the lesion proximally and distally. Patient currently denies diarrhea. Biopsies showed adenocarcinoma. A CT scan of the abdomen and pelvis showed a locally advanced rectal tumor with no definite liver or lung metastases. His CEA level of 123 raising a question of metastatic disease. He is completely asymptomatic. He has no abdominal pain, diarrhea or rectal bleeding. He saw a small amount of blood after the sigmoidoscopy only. He has lost about 30 pounds partially intentionally. He is scheduled for an EUS with Dr.Jacobs for 02/03/2013. He has been scheduled to see Dr.Byerly, rectal surgeon for 02/01/2013. We have requested oncology to see him as well and are waiting for the appointment. He will be put on the cancer conference schedule.     Past Medical History  Diagnosis Date  . Gout   . Hypertension   . ED (erectile dysfunction)   . Hyperplastic colon polyp   . Chronic renal insufficiency, stage II (mild)   . Diverticulosis   . Anemia   . Diabetes    Past Surgical History  Procedure Laterality Date  . Colonoscopy  2010    [facility]    reports that he quit smoking about 35 years ago. He has never used smokeless tobacco. He reports that  drinks alcohol. He reports that he does not use illicit drugs. family history includes Hypertension in his other and Stroke in his father and mother.  There is no history of Colon cancer. No Known Allergies      Review of Systems:Decreased appetite. He denies abdominal pain fever chills   The remainder of the 10 point ROS is negative except as outlined in H&P   Physical Exam: General  appearance  Well developed, in no distress. Eyes- non icteric. HEENT nontraumatic, normocephalic. Mouth no lesions, tongue papillated, no cheilosis. Neck supple without adenopathy, thyroid not enlarged, no carotid bruits, no JVD. Lungs Clear to auscultation bilaterally. Cor normal S1, normal S2, regular rhythm, no murmur,  quiet precordium. Abdomen:  soft nontender mildly protuberant. No palpable mass. Liver edge at costal margin.  Rectal: tumor at the tip of my finger around 8 cm. It circumferential but I am able to insert my entire finger through the lumen. Stool is clearly Hemoccult positive.  Extremities no pedal edema. Skin no lesions. Neurological alert and oriented x 3. Psychological normal mood and affect.  Assessment and Plan:  Problem #81 67 year old [name] with locally advanced adenocarcinoma of the rectum palpable on the digital exam. He has a history of colon polyps on his last colonoscopy 5 years ago. We have made arrangements for him to have an endoscopic ultrasound and see a Psychologist, sport and exercise. He will need radiation as well as presentation at the cancer conference to agree on a treatment plan. I have cut back his Tenorectic to 0.5 tablet a day because of his weight loss and blood pressure of 100/68. He has a blood pressure cuff and will keep checking his blood pressure daily.He needs to check with Dr Sherren Mocha about adjusting his BP meds.. I spoke to him and his wife at length today and they are very understanding and eager to proceed with treatment.  01/25/2013 Jason [name]

## 2013-01-25 NOTE — Patient Instructions (Addendum)
You have been scheduled for an appointment with Dr Barry Dienes at [facility]. Your appointment is on Tuesday, 02/01/13 at 9:15 am. Please arrive at 8:45 am for registration. Make certain to bring a list of current medications, including any over the counter medications or vitamins. Also bring your co-pay if you have one as well as your insurance cards. [facility] is located at [address], [address]. Should you need to reschedule your appointment, please contact them at [phone].  You will be scheduled for an endoscopic ultrasound at [facility] endoscopy on 02/03/13 @ 7:30 am. This will be completed by Dr Owens Loffler. You will need to arrive at 6:00 am. Please follow written instructions given to you at your appointment today. Keep in mind, this is a tentative date/time and may change depending on Dr Ardis Hughs recommendations.  We have contacted Dr Gearldine Shown office at [facility] regarding oncology services. Someone should contact you regarding appointments for this.  Cc: Dr Stevie Kern, Dr Barry Dienes, Dr Benay Spice, Dr Ardis Hughs

## 2013-01-25 NOTE — Telephone Encounter (Signed)
LEFT MESSAGE W/PATTY FOR DOTTIE IN RE TO PATIENT. PT IS AN ESTABLISHED AND WILL FORWARDED REFERRAL AT DESK NURSE FOR APPT TO BE MOVED UP.

## 2013-01-31 ENCOUNTER — Telehealth: Payer: Self-pay | Admitting: Internal Medicine

## 2013-01-31 ENCOUNTER — Other Ambulatory Visit: Payer: Self-pay

## 2013-01-31 ENCOUNTER — Telehealth: Payer: Self-pay

## 2013-01-31 DIAGNOSIS — C20 Malignant neoplasm of rectum: Secondary | ICD-10-CM

## 2013-01-31 NOTE — Telephone Encounter (Signed)
Pt has been scheduled for 02/03/13 730 am lower EUS pt was instructed at his office visit with Dr Olevia Perches

## 2013-01-31 NOTE — Telephone Encounter (Signed)
I read a Dr. Olevia Perches note stating he is scheduled for EUS on 31st however I don't see that he is scheduled. I do agree he needs that appt or if not possible then next Thursday. Lower EUS, dx: rectal cancer staging, +Moderate sedation.   Thanks dj   From: Barron Alvine, CMA Sent: 01/21/2013 11:20 AM To: Milus Banister, MD

## 2013-01-31 NOTE — Telephone Encounter (Signed)
rec'd records from [place].Forward 4 pages to Dr,Brodie

## 2013-02-01 ENCOUNTER — Telehealth: Payer: Self-pay | Admitting: *Deleted

## 2013-02-01 ENCOUNTER — Other Ambulatory Visit (INDEPENDENT_AMBULATORY_CARE_PROVIDER_SITE_OTHER): Payer: Self-pay | Admitting: General Surgery

## 2013-02-01 ENCOUNTER — Ambulatory Visit (INDEPENDENT_AMBULATORY_CARE_PROVIDER_SITE_OTHER): Payer: Medicare Other | Admitting: General Surgery

## 2013-02-01 ENCOUNTER — Encounter (INDEPENDENT_AMBULATORY_CARE_PROVIDER_SITE_OTHER): Payer: Self-pay | Admitting: General Surgery

## 2013-02-01 VITALS — BP 112/58 | HR 78 | Temp 96.6°F | Resp 16 | Ht 67.0 in | Wt 189.2 lb

## 2013-02-01 DIAGNOSIS — C20 Malignant neoplasm of rectum: Secondary | ICD-10-CM

## 2013-02-01 HISTORY — DX: Malignant neoplasm of rectum: C20

## 2013-02-01 NOTE — Progress Notes (Signed)
Chief Complaint  Patient presents with  . New Evaluation    eval rectal adenocarcinoma  . Rectal Problems    HISTORY: Pt is a is 67 yo male who presented with anemia. He was feeling weak and Dr.  Sherren Mocha referred him to medical oncology for workup.  He also developed diarrhea and weight loss. He was referred to GI for evaluation of this and underwent colonoscopy. He was found to have a large circumferential rectal cancer. He denies any bleeding that he can see. He denies bloating or nausea or vomiting. His main complaint is that when he eats, he has to immediately has to go to the bathroom for his diarrhea. He has lost around 20-25 pounds. He has no family history of cancer that he is aware of. He did have a colonoscopy in 2010 where he had a few polyps that were benign. He denies fevers or chills. He denies any rectal, back, or abdominal pain.  Past Medical History  Diagnosis Date  . Gout   . Hypertension   . ED (erectile dysfunction)   . Hyperplastic colon polyp   . Chronic renal insufficiency, stage II (mild)   . Diverticulosis   . Anemia   . Diabetes   . Arthritis     Past Surgical History  Procedure Laterality Date  . Colonoscopy  2010    [facility]    Current Outpatient Prescriptions  Medication Sig Dispense Refill  . allopurinol (ZYLOPRIM) 300 MG tablet Take 1 tablet (300 mg total) by mouth daily.  90 tablet  3  . amLODipine (NORVASC) 10 MG tablet 1 by mouth every morning  90 tablet  3  . atenolol-chlorthalidone (TENORETIC) 50-25 MG per tablet Take 1 tablet by mouth daily.  90 tablet  3  . doxazosin (CARDURA) 8 MG tablet Take 1 tablet (8 mg total) by mouth at bedtime.  100 tablet  3   No current facility-administered medications for this visit.     No Known Allergies   Family History  Problem Relation Age of Onset  . Hypertension Other   . Stroke Mother   . Stroke Father   . Colon cancer Neg Hx      History   Social History  . Marital Status: Married     Spouse Name: N/A    Number of Children: 2  . Years of Education: N/A   Occupational History  .      retired Insurance account manager   Social History Main Topics  . Smoking status: Former Smoker -- 0.50 packs/day for 10 years    Quit date: 07/07/1977  . Smokeless tobacco: Never Used  . Alcohol Use: No     Comment: pint of whisky every 2 weeks and occasional beer until about a month ago   . Drug Use: No  . Sexually Active: None    REVIEW OF SYSTEMS - PERTINENT POSITIVES ONLY: 12 point review of systems negative other than HPI and PMH except for weight loss, joint pain.    EXAM: Filed Vitals:   02/01/13 0926  BP: 112/58  Pulse: 78  Temp: 96.6 F (35.9 C)  Resp: 16   Filed Weights   02/01/13 0926  Weight: 189 lb 3.2 oz (85.821 kg)     Gen:  No acute distress.  Well nourished and well groomed.   Neurological: Alert and oriented to person, place, and time. Coordination normal.  Head: Normocephalic and atraumatic.  Eyes: Conjunctivae are normal. Pupils are equal, round, and reactive to light.  No scleral icterus.  Neck: Normal range of motion. Neck supple. No tracheal deviation or thyromegaly present.  Cardiovascular: Normal rate, regular rhythm, normal heart sounds and intact distal pulses.  Exam reveals no gallop and no friction rub.  No murmur heard. Respiratory: Effort normal.  No respiratory distress. No chest wall tenderness. Breath sounds normal.  No wheezes, rales or rhonchi.  GI: Soft. Bowel sounds are normal. The abdomen is soft and nontender.  There is no rebound and no guarding. Small umbilical hernia present.   Rectal:  Palpable fixed mass at around 4 cm.  Palpable region most prominent posteriorly.   Musculoskeletal: Normal range of motion. Extremities are nontender.  Lymphadenopathy: No cervical, preauricular, postauricular or axillary adenopathy is present Skin: Skin is warm and dry. No rash noted. No diaphoresis. No erythema. No pallor. No clubbing, cyanosis, or edema.    Psychiatric: Normal mood and affect. Behavior is normal. Judgment and thought content normal.    LABORATORY RESULTS: Available labs are reviewed, pertinent abnormal labs include CEA 132.6 Cr 1.6 HCT 25 INR 1.3  RADIOLOGY RESULTS: See E-Chart or I-Site for most recent results.  Images and reports are reviewed. CT abd/pel. IMPRESSION:  1. Locally advanced rectal carcinoma with direct extension into  the mesorectum and possible involvement of the adjacent prostate.  No obstruction.  2. Adenopathy in the adjacent mesocolon, highly suspicious for  nodal metastasis.  3. Too small to characterize right liver lobe lesion. This could  be reevaluated at follow-up or on subsequent MRI.  4. Interpolar right renal lesion which is indeterminate and could  represent a complex cyst or solid renal neoplasm. Concurrent  smaller lower pole right renal lesion. Consider further  characterization with pre and post contrast abdominal MRI. If this  is not performed, pre and postcontrast imaging is recommended on  subsequent CT.  5. Prominent right cardiophrenic angle node and nodularity at the  left lung base, favored to be benign/reactive.  6. Left femoral head avascular necrosis.    ASSESSMENT AND PLAN: Rectal cancer The patient has a new rectal cancer. This starts reasonably low and appears to invade the prostate on the CT As well as demonstrating some lymphadenopathy.  Because of this, I am going to refer him to Dr. Marcello Moores for evaluation.  There is a question of a liver lesion. This is too small to characterize on the imaging.  The patient is quite symptomatic from this mass. Unless the liver lesion becomes more demonstrative on imaging, I would favor local treatment including chemoradiation followed by surgery.  We will make referrals to the [facility] for evaluation by radiation oncology and medical oncology. He had seen Dr. Lamonte Sakai previously for anemia, but he is no longer here. His  endoscopic ultrasound this Thursday. I think this will just confirm what we see on the CT scan that this appears to be an N1 lesion and probably T3 or T4.       Milus Height MD Surgical Oncology, General and [facility]      Visit Diagnoses: 1. Rectal cancer     Primary Care Physician: Joycelyn Man, MD

## 2013-02-01 NOTE — Telephone Encounter (Signed)
Spoke with patient by phone and confirmed appointment with Dr. Benay Spice for 02/11/13.  Contact names and phone numbers were provided.

## 2013-02-01 NOTE — Assessment & Plan Note (Addendum)
The patient has a new rectal cancer. This starts reasonably low and appears to invade the prostate on the CT As well as demonstrating some lymphadenopathy.  Because of this, I am going to refer him to Dr. Marcello Moores for evaluation.  There is a question of a liver lesion. This is too small to characterize on the imaging.  The patient is quite symptomatic from this mass. Unless the liver lesion becomes more demonstrative on imaging, I would favor local treatment including chemoradiation followed by surgery.  We will make referrals to the [facility] for evaluation by radiation oncology and medical oncology. He had seen Dr. Lamonte Sakai previously for anemia, but he is no longer here. His endoscopic ultrasound this Thursday. I think this will just confirm what we see on the CT scan that this appears to be an N1 lesion and probably T3 or T4.

## 2013-02-01 NOTE — Patient Instructions (Signed)
Get ultrasound Thursday.    We will make referral to cancer center for chemoradiation treatment. (medical and radiation oncology)  I will have you follow up with Dr. Marcello Moores.

## 2013-02-02 ENCOUNTER — Telehealth: Payer: Self-pay | Admitting: *Deleted

## 2013-02-02 NOTE — Telephone Encounter (Signed)
Spoke with patient by phone and gave appointment with Dr. Lisbeth Renshaw.  Reviewed schedule and answered questions.

## 2013-02-03 ENCOUNTER — Telehealth: Payer: Self-pay | Admitting: Internal Medicine

## 2013-02-03 ENCOUNTER — Encounter (HOSPITAL_COMMUNITY)
Admission: RE | Disposition: A | Discharge status: Home / Self Care | Payer: Self-pay | Source: Ambulatory Visit | Attending: Gastroenterology

## 2013-02-03 ENCOUNTER — Encounter (HOSPITAL_COMMUNITY): Payer: Self-pay | Admitting: *Deleted

## 2013-02-03 ENCOUNTER — Telehealth: Payer: Self-pay | Admitting: *Deleted

## 2013-02-03 ENCOUNTER — Ambulatory Visit (HOSPITAL_COMMUNITY)
Admission: RE | Admit: 2013-02-03 | Discharge: 2013-02-03 | Disposition: A | Discharge status: Home / Self Care | Payer: Medicare Other | Source: Ambulatory Visit | Attending: Gastroenterology | Admitting: Gastroenterology

## 2013-02-03 DIAGNOSIS — E119 Type 2 diabetes mellitus without complications: Secondary | ICD-10-CM | POA: Insufficient documentation

## 2013-02-03 DIAGNOSIS — C20 Malignant neoplasm of rectum: Secondary | ICD-10-CM | POA: Insufficient documentation

## 2013-02-03 DIAGNOSIS — Z860100 Personal history of colon polyps, unspecified: Secondary | ICD-10-CM | POA: Insufficient documentation

## 2013-02-03 DIAGNOSIS — N182 Chronic kidney disease, stage 2 (mild): Secondary | ICD-10-CM | POA: Insufficient documentation

## 2013-02-03 DIAGNOSIS — K5730 Diverticulosis of large intestine without perforation or abscess without bleeding: Secondary | ICD-10-CM | POA: Insufficient documentation

## 2013-02-03 DIAGNOSIS — I129 Hypertensive chronic kidney disease with stage 1 through stage 4 chronic kidney disease, or unspecified chronic kidney disease: Secondary | ICD-10-CM | POA: Insufficient documentation

## 2013-02-03 DIAGNOSIS — M109 Gout, unspecified: Secondary | ICD-10-CM | POA: Insufficient documentation

## 2013-02-03 DIAGNOSIS — C775 Secondary and unspecified malignant neoplasm of intrapelvic lymph nodes: Secondary | ICD-10-CM | POA: Insufficient documentation

## 2013-02-03 DIAGNOSIS — D649 Anemia, unspecified: Secondary | ICD-10-CM | POA: Insufficient documentation

## 2013-02-03 HISTORY — PX: EUS: SHX5427

## 2013-02-03 SURGERY — ULTRASOUND, LOWER GI TRACT, ENDOSCOPIC
Anesthesia: Moderate Sedation

## 2013-02-03 MED ORDER — DIPHENHYDRAMINE HCL 50 MG/ML IJ SOLN
INTRAMUSCULAR | Status: AC
  Filled 2013-02-03: qty 1

## 2013-02-03 MED ORDER — MIDAZOLAM HCL 10 MG/2ML IJ SOLN
INTRAMUSCULAR | Status: AC
  Filled 2013-02-03: qty 2

## 2013-02-03 MED ORDER — FENTANYL CITRATE 0.05 MG/ML IJ SOLN
INTRAMUSCULAR | Status: AC
  Filled 2013-02-03: qty 2

## 2013-02-03 MED ORDER — MIDAZOLAM HCL 10 MG/2ML IJ SOLN
INTRAMUSCULAR | Status: DC | PRN
  Administered 2013-02-03 (×3): 2 mg via INTRAVENOUS

## 2013-02-03 MED ORDER — FENTANYL CITRATE 0.05 MG/ML IJ SOLN
INTRAMUSCULAR | Status: DC | PRN
  Administered 2013-02-03 (×2): 25 ug via INTRAVENOUS
  Administered 2013-02-03: 12.5 ug via INTRAVENOUS

## 2013-02-03 MED ORDER — SODIUM CHLORIDE 0.9 % IV SOLN
INTRAVENOUS | Status: DC
  Administered 2013-02-03: 07:00:00 via INTRAVENOUS

## 2013-02-03 NOTE — Op Note (Signed)
[facility] [place], [address]   ENDOSCOPIC ULTRASOUND PROCEDURE REPORT  PATIENT: Jason [name], Jason [name]  MR#: [id number] BIRTHDATE: 04/30/1946  GENDER: Male ENDOSCOPIST: Milus Banister, MD REFERRED BY:  Lafayette Dragon, M.D. PROCEDURE DATE:  02/03/2013 PROCEDURE:   Lower EUS ASA CLASS:      Class II INDICATIONS:   newly diagnosed rectal adenocarcinoma. MEDICATIONS: Fentanyl 62.5 mcg IV and Versed 6 mg IV  DESCRIPTION OF PROCEDURE:   After the risks benefits and alternatives of the procedure were  explained, informed consent was obtained. The patient was then placed in the left, lateral, decubitus postion and IV sedation was administered. Throughout the procedure, the patients blood pressure, pulse and oxygen saturations were monitored continuously.  Under direct visualization, the PENTAX EUS SCOPE  endoscope was introduced through the anus  and advanced to the sigmoid colon .  Water was used as necessary to provide an acoustic interface.  Upon completion of the imaging, water was removed and the patient was sent to the recovery room in satisfactory condition.   Sigmoidoscopic findings: 1. Large, friable, nearly circumferential mass in rectum. The mass is 6cm long, the distal edge of the mass is 4-5cm from the anal verge.  EUS findings: 1. The mass described above correlates with a heterogeneous, hypoechoic mass that clearly passes into and through the muscularis propria layer of the rectal wall (uT3). There is a clear fatty tissue plane between the mass and the prostate that appears intact. The mass invades 3cm deep. 2.  There are four round, well circumscribed, hypoechoic perirectal lympnodes (uN2)  Impression: 6cm long, bulky, nearly circumferential uT3N2 rectal adenocarcinoma with distal edge 4-5cm from the anal verge.  There is a preserved fatty tissue plane between the mass and the prostate.   _______________________________ eSigned:   Milus Banister, MD 02/03/2013 7:48 AM

## 2013-02-03 NOTE — Telephone Encounter (Signed)
Gave pt appt calendar for August 2014 lab, md and chemo, pt cancelled lab for 8/1 ,nurse

## 2013-02-03 NOTE — Telephone Encounter (Signed)
Patient called and stated he would not be coming for a lab appointment tomorrow because Dr. Ardis Hughs saw him today and said it was not needed. It was originally ordered per Dr. Lamonte Sakai.  Dr. Gearldine Shown nurse notified.

## 2013-02-03 NOTE — Interval H&P Note (Signed)
History and Physical Interval Note:  02/03/2013 7:20 AM  Jason [name]  has presented today for surgery, with the diagnosis of Rectal cancer [154.1]  The various methods of treatment have been discussed with the patient and family. After consideration of risks, benefits and other options for treatment, the patient has consented to  Procedure(s): LOWER ENDOSCOPIC ULTRASOUND (EUS) (N/A) as a surgical intervention .  The patient's history has been reviewed, patient examined, no change in status, stable for surgery.  I have reviewed the patient's chart and labs.  Questions were answered to the patient's satisfaction.     Owens Loffler

## 2013-02-03 NOTE — H&P (View-Only) (Signed)
Jason [name]. [birth year] MRN [id number]  History of Present Illness:  This is a 67 year old African American male with a new diagnosis of locally advanced rectal carcinoma starting around 8 cm from the anal canal and extending to at least 10-12 cm. It was found on a flexible sigmoidoscopy which was done for evaluation of diarrhea. A spot tattoo was applied to the lesion proximally and distally. Patient currently denies diarrhea. Biopsies showed adenocarcinoma. A CT scan of the abdomen and pelvis showed a locally advanced rectal tumor with no definite liver or lung metastases. His CEA level of 123 raising a question of metastatic disease. He is completely asymptomatic. He has no abdominal pain, diarrhea or rectal bleeding. He saw a small amount of blood after the sigmoidoscopy only. He has lost about 30 pounds partially intentionally. He is scheduled for an EUS with Dr.Jacobs for 02/03/2013. He has been scheduled to see Dr.Byerly, rectal surgeon for 02/01/2013. We have requested oncology to see him as well and are waiting for the appointment. He will be put on the cancer conference schedule.     Past Medical History  Diagnosis Date  . Gout   . Hypertension   . ED (erectile dysfunction)   . Hyperplastic colon polyp   . Chronic renal insufficiency, stage II (mild)   . Diverticulosis   . Anemia   . Diabetes    Past Surgical History  Procedure Laterality Date  . Colonoscopy  2010    [facility]    reports that he quit smoking about 35 years ago. He has never used smokeless tobacco. He reports that  drinks alcohol. He reports that he does not use illicit drugs. family history includes Hypertension in his other and Stroke in his father and mother.  There is no history of Colon cancer. No Known Allergies      Review of Systems:Decreased appetite. He denies abdominal pain fever chills   The remainder of the 10 point ROS is negative except as outlined in H&P   Physical Exam: General  appearance  Well developed, in no distress. Eyes- non icteric. HEENT nontraumatic, normocephalic. Mouth no lesions, tongue papillated, no cheilosis. Neck supple without adenopathy, thyroid not enlarged, no carotid bruits, no JVD. Lungs Clear to auscultation bilaterally. Cor normal S1, normal S2, regular rhythm, no murmur,  quiet precordium. Abdomen:  soft nontender mildly protuberant. No palpable mass. Liver edge at costal margin.  Rectal: tumor at the tip of my finger around 8 cm. It circumferential but I am able to insert my entire finger through the lumen. Stool is clearly Hemoccult positive.  Extremities no pedal edema. Skin no lesions. Neurological alert and oriented x 3. Psychological normal mood and affect.  Assessment and Plan:  Problem #61 67 year old [name] with locally advanced adenocarcinoma of the rectum palpable on the digital exam. He has a history of colon polyps on his last colonoscopy 5 years ago. We have made arrangements for him to have an endoscopic ultrasound and see a Psychologist, sport and exercise. He will need radiation as well as presentation at the cancer conference to agree on a treatment plan. I have cut back his Tenorectic to 0.5 tablet a day because of his weight loss and blood pressure of 100/68. He has a blood pressure cuff and will keep checking his blood pressure daily.He needs to check with Dr Sherren Mocha about adjusting his BP meds.. I spoke to him and his wife at length today and they are very understanding and eager to proceed with treatment.  01/25/2013 Delfin Edis

## 2013-02-04 ENCOUNTER — Encounter (HOSPITAL_COMMUNITY): Payer: Self-pay | Admitting: Gastroenterology

## 2013-02-04 ENCOUNTER — Other Ambulatory Visit: Payer: Medicare Other

## 2013-02-08 ENCOUNTER — Encounter (INDEPENDENT_AMBULATORY_CARE_PROVIDER_SITE_OTHER): Payer: Self-pay | Admitting: General Surgery

## 2013-02-08 ENCOUNTER — Ambulatory Visit (INDEPENDENT_AMBULATORY_CARE_PROVIDER_SITE_OTHER): Payer: Medicare Other | Admitting: General Surgery

## 2013-02-08 VITALS — BP 118/64 | HR 70 | Resp 14 | Ht 67.0 in | Wt 190.0 lb

## 2013-02-08 DIAGNOSIS — C20 Malignant neoplasm of rectum: Secondary | ICD-10-CM

## 2013-02-08 NOTE — Patient Instructions (Signed)
Colorectal Cancer  Colorectal cancer is the second most common cancer in the [place], striking 140,000 people annually and causing 60,000 deaths. That's a staggering figure when you consider the disease is potentially curable if diagnosed in the early stages. Who is at risk? Though colorectal cancer may occur at any age, more than 90% of the patients are over age 66, at which point the risk doubles every ten years. In addition to age, other high risk factors include a family history of colorectal cancer and polyps and a personal history of ulcerative colitis, colon polyps or cancer of other organs, especially of the breast or uterus. How does it start? It is generally agreed that nearly all colon and rectal cancer begins in benign polyps. These pre-malignant growths occur on the bowel wall and may eventually increase in size and become cancer. Removal of benign polyps is one aspect of preventive medicine that really works! What are the symptoms? The most common symptoms are rectal bleeding and changes in bowel habits, such as constipation or diarrhea. (These symptoms are also common in other diseases so it is important you receive a thorough examination should you experience them.) Abdominal pain and weight loss are usually late symptoms indicating possible extensive disease. Unfortunately, many polyps and early cancers fail to produce symptoms. Therefore, it is important that your routine physical includes colorectal cancer detection procedures once you reach age 50.  There are several methods for detection of colorectal cancer. These include digital rectal examination, a chemical test of the stool for blood, flexible sigmoidoscopy and colonoscopy (lighted tubular instruments used to inspect the lower bowel) and barium enema. Be sure to discuss these options with your surgeon to determine which procedure is best for you. Individuals who have a first-degree relative (parent or sibling) with colon  cancer or polyps should start their colon cancer screening at the age of 66. How is colorectal cancer treated? Colorectal cancer requires surgery in nearly all cases for complete cure. Radiation and chemotherapy are sometimes used in addition to surgery. Between 80-90% are restored to normal health if the cancer is detected and treated in the earliest stages. The cure rate drops to 50% or less when diagnosed in the later stages. Thanks to modern technology, less than 5% of all colorectal cancer patients require a colostomy, the surgical construction of an artificial excretory opening from the colon. Can colon cancer be prevented? Colon cancer is very preventable. The most important step towards preventing colon cancer is getting a screening test. Any abnormal screening test should be followed by a colonoscopy. Some individuals prefer to start with colonoscopy as a screening test. Colonoscopy provides a detailed examination of the bowel. Polyps can be identified and can often be removed during colonoscopy. Though not definitely proven, there is some evidence that diet may play a significant role in preventing colorectal cancer. As far as we know, a high fiber, low fat diet is the only dietary measure that might help prevent colorectal cancer. Finally, pay attention to changes in your bowel habits. Any new changes such as persistent constipation, diarrhea, or blood in the stool should be discussed with your physician.   Can hemorrhoids lead to colon cancer? No, but hemorrhoids may produce symptoms similar to colon polyps or cancer. Should you experience these symptoms, you should have them examined and evaluated by a physician, preferably by a colon and rectal surgeon. What is a colon and rectal surgeon? Colon and rectal surgeons are experts in the surgical and non-surgical treatment   of diseases of the colon, rectum and anus. They have completed advanced surgical training in the treatment of these  diseases as well as full general surgical training. Board-certified colon and rectal surgeons complete residencies in general surgery and colon and rectal surgery, and pass intensive examinations conducted by the American Board of Surgery and the American Board of Colon and Rectal Surgery. They are well-versed in the treatment of both benign and malignant diseases of the colon, rectum and anus and are able to perform routine screening examinations and surgically treat conditions if indicated to do so.  2012 American Society of Colon & Rectal Surgeons  

## 2013-02-08 NOTE — Progress Notes (Signed)
Chief Complaint  Patient presents with  . New Evaluation    eval rectal carcinoma    HISTORY: Jason [name] is a 67 y.o. male who presents to the office with rectal cancer.  This was found on colonoscopy after workup for diarrhea.  Workup thus far has included CT Abd, which showed a renal lesion and a too small to characterize hepatic lesion.  His CEA is elevated.  His colonoscopy revealed rectal cancer extending about 10cm into the rectal canal.   He reports a 10lb weight loss since Jan.  He has occasional blood in stool.  Denies abd pain or distention.  Past Medical History  Diagnosis Date  . Gout   . Hypertension   . Hyperplastic colon polyp   . Diverticulosis   . Anemia   . Arthritis   . ED (erectile dysfunction)   . Chronic renal insufficiency, stage II (mild)       Past Surgical History  Procedure Laterality Date  . Colonoscopy  2010    [facility]  . Eus N/A 02/03/2013    Procedure: LOWER ENDOSCOPIC ULTRASOUND (EUS);  Surgeon: Milus Banister, MD;  Location: [facility];  Service: Endoscopy;  Laterality: N/A;      Current Outpatient Prescriptions  Medication Sig Dispense Refill  . allopurinol (ZYLOPRIM) 300 MG tablet Take 1 tablet (300 mg total) by mouth daily.  90 tablet  3  . amLODipine (NORVASC) 10 MG tablet 1 by mouth every morning  90 tablet  3  . atenolol-chlorthalidone (TENORETIC) 50-25 MG per tablet Take 1 tablet by mouth daily.  90 tablet  3  . doxazosin (CARDURA) 8 MG tablet Take 1 tablet (8 mg total) by mouth at bedtime.  100 tablet  3   No current facility-administered medications for this visit.      No Known Allergies    Family History  Problem Relation Age of Onset  . Hypertension Other   . Stroke Mother   . Stroke Father   . Colon cancer Neg Hx       History   Social History  . Marital Status: Married    Spouse Name: N/A    Number of Children: 2  . Years of Education: N/A   Occupational History  .      retired Insurance account manager   Social  History Main Topics  . Smoking status: Former Smoker -- 0.50 packs/day for 10 years    Quit date: 07/07/1977  . Smokeless tobacco: Never Used  . Alcohol Use: No     Comment: pint of whisky every 2 weeks and occasional beer until about a month ago   . Drug Use: No  . Sexually Active: None   Other Topics Concern  . None   Social History Narrative  . None       REVIEW OF SYSTEMS - PERTINENT POSITIVES ONLY: Review of Systems - General ROS: negative for - chills or fever Hematological and Lymphatic ROS: negative for - bleeding problems or blood clots Cardiovascular ROS: no chest pain or dyspnea on exertion Gastrointestinal ROS: no abdominal pain, change in bowel habits, or black or bloody stools Genito-Urinary ROS: no dysuria, trouble voiding, or hematuria  EXAM: Filed Vitals:   02/08/13 0950  BP: 118/64  Pulse: 70  Resp: 14    Gen:  No acute distress.  Well nourished and well groomed.   Neurological: Alert and oriented to person, place, and time. Coordination normal.  Head: Normocephalic and atraumatic.  Eyes: Conjunctivae are  normal. Pupils are equal, round, and reactive to light. No scleral icterus.  Neck: Normal range of motion. Neck supple. No tracheal deviation or thyromegaly present.  No cervical lymphadenopathy. Cardiovascular: Normal rate, regular rhythm, normal heart sounds and intact distal pulses.  Exam reveals no gallop and no friction rub.  No murmur heard. Respiratory: Effort normal.  No respiratory distress. No chest wall tenderness. Breath sounds normal.  No wheezes, rales or rhonchi.  GI: Soft. Bowel sounds are normal. The abdomen is soft and nontender.  There is no rebound and no guarding.  Musculoskeletal: Normal range of motion. Extremities are nontender.  Skin: Skin is warm and dry. No rash noted. No diaphoresis. No erythema. No pallor. No clubbing, cyanosis, or edema.   Psychiatric: Normal mood and affect. Behavior is normal. Judgment and thought content  normal.   Anorectal Exam: tumor at 4-5 cm, ~1cm above levators   LABORATORY RESULTS: Available labs are reviewed  Lab Results  Component Value Date   CEA 132.6* 01/21/2013   Lab Results  Component Value Date   CREATININE 1.6* 01/21/2013      RADIOLOGY RESULTS:   Images and reports are reviewed. IMPRESSION:  1. Locally advanced rectal carcinoma with direct extension into  the mesorectum and possible involvement of the adjacent prostate.  No obstruction.  2. Adenopathy in the adjacent mesocolon, highly suspicious for  nodal metastasis.  3. Too small to characterize right liver lobe lesion. This could  be reevaluated at follow-up or on subsequent MRI.  4. Interpolar right renal lesion which is indeterminate and could  represent a complex cyst or solid renal neoplasm. Concurrent  smaller lower pole right renal lesion. Consider further  characterization with pre and post contrast abdominal MRI. If this  is not performed, pre and postcontrast imaging is recommended on  subsequent CT.  5. Prominent right cardiophrenic angle node and nodularity at the  left lung base, favored to be benign/reactive.  6. Left femoral head avascular necrosis.      ASSESSMENT AND PLAN: Jason [name] is a 67 y.o. M with a new diagnosis of rectal cancer.  He has no evidence of metastatic disease in his liver except for a very small lesion that cannot be characterized.  His CEA is quite elevated.  His US revealed a T2N2 lesion.  I will order a CT of his chest to complete his workup.  He does have some chronic renal insuffiencey.  His baseline Cr appears to range from 1.6 to 2.0.  He is scheduled to see Dr Benay Spice and Dr Lisbeth Renshaw later this week.  I will see him back after his radiation therapy has completed.      Rosario Adie, MD Colon and Rectal Surgery / [facility]      Visit Diagnoses: No diagnosis found.  Primary Care Physician: Joycelyn Man,  MD

## 2013-02-09 ENCOUNTER — Encounter: Payer: Self-pay | Admitting: Oncology

## 2013-02-09 NOTE — Progress Notes (Signed)
GI Location of Tumor / Histology:  Invasive adenocarcinoma of the rectum.  Patient presented with symptoms of: anemia and diarrhea, having 5-8 loose stools a day for the past 3 or 4 weeks  Biopsies of rectum (if applicable) revealed: Diagnosis 1. Colon Descending , descending - BENIGN COLONIC MUCOSA. NO SIGNIFICANT INFLAMMATION OR OTHER ABNORMALITIES IDENTIFIED. 2. Colon, polyp(s), 40 cm - HYPERPLASTIC POLYPS. - NO EVIDECE OF ADENOMATOUS CHANGES OR MALIGNANCY. 3. Rectum, biopsy, sigmoid - INVASIVE ADENOCARCINOMA.  Past/Anticipated interventions by surgeon, if any: colonoscopy 02/03/2013  Past/Anticipated interventions by medical oncology, if any: Will be seeing Dr. Marcello Moores  Weight changes, if any: 20-25 lbs weight loss  Bowel/Bladder complaints, if any: diarrhea with 3-4 stools per day.  He does see blood in his stools occasionally.  Nausea / Vomiting, if any: no  Pain issues, if any:  no  SAFETY ISSUES:  Prior radiation? no  Pacemaker/ICD? no  Possible current pregnancy? no  Is the patient on methotrexate? no  Current Complaints / other details:  Jason [name] here with his wife for consult for rectal cancer.

## 2013-02-10 ENCOUNTER — Encounter (HOSPITAL_COMMUNITY): Payer: Self-pay

## 2013-02-10 ENCOUNTER — Ambulatory Visit
Admission: RE | Admit: 2013-02-10 | Discharge: 2013-02-10 | Disposition: A | Discharge status: Home / Self Care | Payer: Medicare Other | Source: Ambulatory Visit | Attending: Radiation Oncology | Admitting: Radiation Oncology

## 2013-02-10 ENCOUNTER — Ambulatory Visit (HOSPITAL_COMMUNITY)
Admission: RE | Admit: 2013-02-10 | Discharge: 2013-02-10 | Disposition: A | Discharge status: Home / Self Care | Payer: Medicare Other | Source: Ambulatory Visit | Attending: General Surgery | Admitting: General Surgery

## 2013-02-10 VITALS — BP 131/70 | HR 67 | Temp 98.0°F | Ht 67.0 in | Wt 190.1 lb

## 2013-02-10 DIAGNOSIS — R918 Other nonspecific abnormal finding of lung field: Secondary | ICD-10-CM | POA: Insufficient documentation

## 2013-02-10 DIAGNOSIS — C20 Malignant neoplasm of rectum: Secondary | ICD-10-CM | POA: Insufficient documentation

## 2013-02-10 DIAGNOSIS — I10 Essential (primary) hypertension: Secondary | ICD-10-CM | POA: Insufficient documentation

## 2013-02-10 DIAGNOSIS — M87059 Idiopathic aseptic necrosis of unspecified femur: Secondary | ICD-10-CM | POA: Insufficient documentation

## 2013-02-10 DIAGNOSIS — I700 Atherosclerosis of aorta: Secondary | ICD-10-CM | POA: Insufficient documentation

## 2013-02-10 DIAGNOSIS — Z862 Personal history of diseases of the blood and blood-forming organs and certain disorders involving the immune mechanism: Secondary | ICD-10-CM | POA: Insufficient documentation

## 2013-02-10 DIAGNOSIS — Z8639 Personal history of other endocrine, nutritional and metabolic disease: Secondary | ICD-10-CM | POA: Insufficient documentation

## 2013-02-10 DIAGNOSIS — Z79899 Other long term (current) drug therapy: Secondary | ICD-10-CM | POA: Insufficient documentation

## 2013-02-10 HISTORY — DX: Malignant (primary) neoplasm, unspecified: C80.1

## 2013-02-10 MED ORDER — IOHEXOL 300 MG/ML  SOLN
80.0000 mL | Freq: Once | INTRAMUSCULAR | Status: AC | PRN
  Administered 2013-02-10: 80 mL via INTRAVENOUS

## 2013-02-10 NOTE — Progress Notes (Signed)
Please see the Nurse Progress Note in the MD Initial Consult Encounter for this patient. 

## 2013-02-11 ENCOUNTER — Other Ambulatory Visit: Payer: Self-pay | Admitting: *Deleted

## 2013-02-11 ENCOUNTER — Ambulatory Visit (HOSPITAL_BASED_OUTPATIENT_CLINIC_OR_DEPARTMENT_OTHER): Payer: Medicare Other | Admitting: Oncology

## 2013-02-11 ENCOUNTER — Encounter: Payer: Self-pay | Admitting: Oncology

## 2013-02-11 ENCOUNTER — Ambulatory Visit: Payer: Medicare Other

## 2013-02-11 ENCOUNTER — Ambulatory Visit: Payer: Medicare Other | Admitting: Internal Medicine

## 2013-02-11 ENCOUNTER — Telehealth: Payer: Self-pay | Admitting: Oncology

## 2013-02-11 VITALS — BP 116/69 | HR 64 | Temp 97.2°F | Resp 18 | Ht 67.0 in | Wt 190.3 lb

## 2013-02-11 DIAGNOSIS — K7689 Other specified diseases of liver: Secondary | ICD-10-CM

## 2013-02-11 DIAGNOSIS — C20 Malignant neoplasm of rectum: Secondary | ICD-10-CM

## 2013-02-11 DIAGNOSIS — R918 Other nonspecific abnormal finding of lung field: Secondary | ICD-10-CM

## 2013-02-11 DIAGNOSIS — D649 Anemia, unspecified: Secondary | ICD-10-CM

## 2013-02-11 DIAGNOSIS — I10 Essential (primary) hypertension: Secondary | ICD-10-CM

## 2013-02-11 DIAGNOSIS — R970 Elevated carcinoembryonic antigen [CEA]: Secondary | ICD-10-CM

## 2013-02-11 NOTE — Progress Notes (Signed)
Met with patient and family. Explained role of nurse navigator. Educational information provided on rectal cancer along with [facility] resource sheet and phone numbers.Referral made to dietician for diet education. [facility] resources provided to patient, including SW service information.  Contact names and phone numbers were provided.  No barriers to care identified.  Will continue to follow as needed.

## 2013-02-11 NOTE — Telephone Encounter (Signed)
gv pt appt schedule for August and September and lmonvm for SW (Laur[place]) to contact pt. Pt aware.

## 2013-02-11 NOTE — Progress Notes (Signed)
[facility] New Patient Consult   Referring MD: Altariq Salaz 67 y.o.  Oct 02, 1945    Reason for Referral: Rectal cancer     HPI:  He was referred to Dr. Nevada Crane in June of this year for evaluation of anemia. He was noted to have diarrhea and weight loss. He was referred to Dr. Olevia Perches and underwent a flexible sigmoidoscopy on 01/21/2013. 2 sessile polyps were found in the descending colon. A circumferential fungating mass was found in the rectum beginning in the anal canal and extending to 10 cm. The mass was tattooed and biopsy. The pathology revealed hyperplastic polyps at 40 cm and the colon. The rectum biopsy revealed invasive adenocarcinoma.  He was referred to Dr. Ardis Hughs and underwent a endoscopic ultrasound on 02/03/2013. A mass was noted in the rectum with the distal edge 4-5 cm from the anal verge. The mass passed into and through the muscularis propria of the rectal wall with a fat plane between the mass and prostate. 4 well-circumscribed hypoechoic perirectal lymph nodes were seen. The tumor was staged as a uT3, uN2 lesion.  A CT of the abdomen and pelvis on 01/24/2013 revealed a borderline enlarged right cardiophrenic node measuring 8 mm. Small retroperitoneal nodes were not pathologically enlarged. No evidence of omental peritoneal disease. A rectal tumor was noted with contact to the posterior aspect of the prostate. Adenopathy was noted within the sigmoid mesocolon. A 7 mm left obturator node was seen. A too small to characterize right liver lesion and an indeterminate interpolar right renal lesion were noted. Nodularity at the left lung base was felt to be benign/reactive.  He was referred for a CT the chest on 02/10/2013. Widespread pleural-based nodularity was noted with a dominant lesion measuring 12 x 8 mm overlying the right upper lobe. No suspicious mediastinal, hilar, or axillary lymphadenopathy. 8 mm right cardiophrenic node. An irregular 9 mm  right upper lobe nodule with additional smaller bilateral pulmonary nodules.  He was referred to Dr. Marcello Moores and his referred for neoadjuvant therapy. His case was presented at the GI tumor conference on 02/09/2013 and neoadjuvant therapy was recommended. He saw Dr. Lisbeth Renshaw and is scheduled to undergo radiation simulation next week.  Past Medical History  Diagnosis Date  . Gout   . Hypertension   . Hyperplastic colon polyp   . Diverticulosis   . Anemia   . Arthritis   . ED (erectile dysfunction)   . Chronic renal insufficiency, stage II (mild)   . rectal ca-clinical stage III  dx'd 02/01/13    Past Surgical History  Procedure Laterality Date  . Colonoscopy  2010    [facility]  . Eus N/A 02/03/2013    Procedure: LOWER ENDOSCOPIC ULTRASOUND (EUS);  Surgeon: Milus Banister, MD;  Location: [facility];  Service: Endoscopy;  Laterality: N/A;    Family History  Problem Relation Age of Onset  . Hypertension Other   . Stroke Mother   . Stroke Father   . Colon cancer Neg Hx    4 brothers, no family history of cancer  Current outpatient prescriptions:allopurinol (ZYLOPRIM) 300 MG tablet, Take 1 tablet (300 mg total) by mouth daily., Disp: 90 tablet, Rfl: 3;  amLODipine (NORVASC) 10 MG tablet, 1 by mouth every morning, Disp: 90 tablet, Rfl: 3;  atenolol-chlorthalidone (TENORETIC) 50-25 MG per tablet, Take 1 tablet by mouth daily., Disp: 90 tablet, Rfl: 3;  doxazosin (CARDURA) 8 MG tablet, Take 1 tablet (8 mg total) by  mouth at bedtime., Disp: 100 tablet, Rfl: 3  Allergies: No Known Allergies  Social History: He is retired. He worked in a Loss adjuster, chartered. He quit smoking cigarettes in 1979. He drinks alcohol occasionally. No transfusion history. No risk factor for HIV or hepatitis.   ROS:   Positives include: 10 pound weight loss, rectal bleeding after the sigmoidoscopy, intermittent diarrhea  A complete ROS was otherwise negative.  Physical Exam:  Blood pressure 116/69, pulse 64,  temperature 97.2 F (36.2 C), temperature source Oral, resp. rate 18, height 5\' 7"  (1.702 m), weight 190 lb 4.8 oz (86.32 kg).  HEENT: Edentulous, upper denture plate, oropharynx without visible mass, neck without mass Lungs: Clear bilaterally Cardiac: Regular rate and rhythm Abdomen: No hepatosplenomegaly, nontender, no mass GU: Testes without mass  Vascular: No leg edema Lymph nodes: No cervical, supra-clavicular, or inguinal nodes. Prominent bilateral axillary fat pads,? Soft mobile 1-2 cm medial nodes bilaterally Neurologic: Alert and oriented, the motor exam appears intact in the upper and lower extremities Skin: No rash Musculoskeletal: No spine tenderness   LAB:  CBC  Lab Results  Component Value Date   WBC 10.7* 01/21/2013   HGB 11.1* 01/21/2013   HCT 35.0* 01/21/2013   MCV 86.3 01/21/2013   PLT 337.0 01/21/2013     CMP      Component Value Date/Time   NA 136 01/21/2013 1142   NA 142 12/09/2012 1048   K 3.5 01/21/2013 1142   K 3.5 12/09/2012 1048   CL 98 01/21/2013 1142   CL 101 12/09/2012 1048   CO2 29 01/21/2013 1142   CO2 31* 12/09/2012 1048   GLUCOSE 105* 01/21/2013 1142   GLUCOSE 96 12/09/2012 1048   GLUCOSE 114* 05/11/2006 1014   BUN 17 01/21/2013 1142   BUN 34.0* 12/09/2012 1048   CREATININE 1.6* 01/21/2013 1142   CREATININE 2.0* 12/09/2012 1048   CALCIUM 9.5 01/21/2013 1142   CALCIUM 10.4 12/09/2012 1048   PROT 7.4 01/21/2013 1142   PROT 8.1 12/09/2012 1048   ALBUMIN 3.5 01/21/2013 1142   ALBUMIN 3.5 12/09/2012 1048   AST 11 01/21/2013 1142   AST 9 12/09/2012 1048   ALT 11 01/21/2013 1142   ALT 7 12/09/2012 1048   ALKPHOS 68 01/21/2013 1142   ALKPHOS 98 12/09/2012 1048   BILITOT 0.5 01/21/2013 1142   BILITOT 0.47 12/09/2012 1048   GFRNONAA 41* 04/24/2010 1240   GFRAA  Value: 50        The eGFR has been calculated using the MDRD equation. This calculation has not been validated in all clinical situations. eGFR's persistently <60 mL/min signify possible Chronic Kidney Disease.* 04/24/2010  1240   CEA on 01/21/2013-132.6  Radiology: As per history of present illness    Assessment/Plan:   1. Rectal, clinical stage III (uT3,uN2)  2. Markedly elevated CEA 3. Indeterminate pulmonary/subpleural nodules and right hepatic dome lesion 4. Renal insufficiency 5. Anemia-? Secondary to renal insufficiency or rectal bleeding 6. Hypertension 7. Gout   Disposition:    Mr. Gegg has been diagnosed with locally advanced rectal cancer. I discussed the diagnosis and treatment options with the patient and his wife. I agree with the plan for neoadjuvant chemotherapy/radiation though there is a possibility he has metastatic disease. This is based on the elevated CEA and indeterminate pleural/parenchymal lung lesions.  We will discuss the indication for a staging PET scan with Dr. Lisbeth Renshaw.  I recommend concurrent capecitabine and radiation. We reviewed the potential toxicities associated with capecitabine including the chance  for mucositis, diarrhea, hematologic toxicity, rash, hyperpigmentation, and the hand/foot syndrome. He will attend a chemotherapy teaching class. We will plan on dose reducing the capecitabine secondary to his renal insufficiency.  The plan is to begin concurrent chemotherapy/radiation on 02/21/2013. He will return for an office visit on 03/10/2013.  [place], [place] 02/11/2013, 6:11 PM

## 2013-02-11 NOTE — Progress Notes (Signed)
Checked in new patient with no financial issues. Didn't ask if living will/POA. Mail and phone for communication only.

## 2013-02-11 NOTE — Progress Notes (Signed)
Patient completed the Patient Measure of Distress tool and rated his distress level 0/10. Denies any home needs at this time.

## 2013-02-13 NOTE — Progress Notes (Signed)
Radiation Oncology         [phone] ________________________________  Name: Jason [name] MRN: [id number]  Date: 02/10/2013  DOB: Jan 17, 1946  [id number] ALLEN, MD  Stark Klein, MD   G. Kavin Leech M.D.  REFERRING PHYSICIAN: Stark Klein, MD   DIAGNOSIS: The encounter diagnosis was Rectal cancer.   HISTORY OF PRESENT ILLNESS::Jason [name] is a 67 y.o. male who is seen for an initial consultation visit. The patient was initially worked up for evaluation of anemia in the setting of some diarrhea and weight loss. The patient was evaluated for anemia in the setting of some diarrhea and weight loss. Flexible sigmoidoscopy was completed. 2 sessile polyps were seen within the descending colon and a circumferential fungating mass was seen in the rectum extending to approximately 10 cm. This was tattooed and a biopsy was performed which revealed invasive adenocarcinoma.  The patient then underwent an endoscopic ultrasound on 02/03/2013. This showed a large friable nearly circumferential mass in the rectum. This measured 6 cm in length with the distal edge of the mass at approximately 4-5 cm from the anal verge. Based on ultrasound, this represented a T3 N2 tumor.  A CT scan of the abdomen and pelvis was performed. This revealed what appeared to be a locally advanced rectal carcinoma with direct extension into the knees a rectal. There is also possible involvement of the adjacent prostate 08 clear plane was seen of preserved fatty tissue between the mass and the prostate on ultrasound. Adenopathy was present in the adjacent music: Which was highly suspicious for nodal metastasis. These were the dominant relevant findings. The patient also had a CT scan of the chest completed on 02/10/2013. This showed widespread pleural-based nodularity without any suspicious mediastinal or hilar lymphadenopathy. There suspicious pulmonary nodules were subcentimeter which was felt to likely be below PET  sensitivity. The patient has had a CEA level drawn which was 132.6.  The patient's case has been discussed in multidisciplinary GI conference and he is felt to be a good candidate for neoadjuvant chemoradiotherapy. His dominant symptoms today is some continued diarrhea. He denies any current pelvic pain or blood per act up.    PREVIOUS RADIATION THERAPY: No   PAST MEDICAL HISTORY:  has a past medical history of Gout; Hypertension; Hyperplastic colon polyp; Diverticulosis; Anemia; Arthritis; ED (erectile dysfunction); Chronic renal insufficiency, stage II (mild); and rectal ca (dx'd 02/01/13).     PAST SURGICAL HISTORY: Past Surgical History  Procedure Laterality Date  . Colonoscopy  2010    [facility]  . Eus N/A 02/03/2013    Procedure: LOWER ENDOSCOPIC ULTRASOUND (EUS);  Surgeon: Milus Banister, MD;  Location: [facility];  Service: Endoscopy;  Laterality: N/A;     FAMILY HISTORY: family history includes Hypertension in his other and Stroke in his father and mother.  There is no history of Colon cancer.   SOCIAL HISTORY:  reports that he quit smoking about 35 years ago. He has never used smokeless tobacco. He reports that he does not drink alcohol or use illicit drugs.   ALLERGIES: Review of patient's allergies indicates no known allergies.   MEDICATIONS:  Current Outpatient Prescriptions  Medication Sig Dispense Refill  . allopurinol (ZYLOPRIM) 300 MG tablet Take 1 tablet (300 mg total) by mouth daily.  90 tablet  3  . amLODipine (NORVASC) 10 MG tablet 1 by mouth every morning  90 tablet  3  . atenolol-chlorthalidone (TENORETIC) 50-25 MG per tablet Take 1 tablet by mouth daily.  90 tablet  3  . doxazosin (CARDURA) 8 MG tablet Take 1 tablet (8 mg total) by mouth at bedtime.  100 tablet  3   No current facility-administered medications for this encounter.     REVIEW OF SYSTEMS:  A 15 point review of systems is documented in the electronic medical record. This was obtained by  the nursing staff. However, I reviewed this with the patient to discuss relevant findings and make appropriate changes.  Pertinent items are noted in HPI.    PHYSICAL EXAM:  height is 5\' 7"  (1.702 m) and weight is 190 lb 1.6 oz (86.229 kg). His temperature is 98 F (36.7 C). His blood pressure is 131/70 and his pulse is 67.   General: Well-developed, in no acute distress HEENT: Normocephalic, atraumatic Cardiovascular: Regular rate and rhythm Respiratory: Clear to auscultation bilaterally GI: Soft, nontender, normal bowel sounds Extremities: No edema present Rectal: A mass is present on digital rectal exam largely circumferential in greater on the left at approximately 5 cm from the anal verge.   LABORATORY DATA:  Lab Results  Component Value Date   WBC 10.7* 01/21/2013   HGB 11.1* 01/21/2013   HCT 35.0* 01/21/2013   MCV 86.3 01/21/2013   PLT 337.0 01/21/2013   Lab Results  Component Value Date   NA 136 01/21/2013   K 3.5 01/21/2013   CL 98 01/21/2013   CO2 29 01/21/2013   Lab Results  Component Value Date   ALT 11 01/21/2013   AST 11 01/21/2013   ALKPHOS 68 01/21/2013   BILITOT 0.5 01/21/2013      RADIOGRAPHY: Ct Chest W Contrast  02/10/2013   *RADIOLOGY REPORT*  Clinical Data: Rectal cancer, evaluate for lung metastases  CT CHEST WITH CONTRAST  Technique:  Multidetector CT imaging of the chest was performed following the standard protocol during bolus administration of intravenous contrast.  Contrast: 49mL OMNIPAQUE IOHEXOL 300 MG/ML  SOLN  Comparison: None.  Findings: Irregular 9 x 8 mm right upper lobe pulmonary nodule (series 5/image 22).  Adjacent 4 mm right upper lobe pulmonary nodule (series 5/image 20).  3 mm subpleural nodule in the left lower lobe (series 5/image 34).  Widespread pleural-based nodularity bilaterally, possibly with an upper lobe predominance.  The dominant lesion measures 12 x 8 mm overlying the lateral right upper lobe (series 5/image 16).  No pleural effusion or  pneumothorax.  Visualized thyroid is unremarkable.  The heart is normal in size.  No pericardial effusion. Atherosclerotic calcifications of the aortic arch.  No suspicious mediastinal, hilar, or axillary lymphadenopathy.  8 mm short-axis right cardiophrenic angle node (series 2/image 40), indeterminate.  Visualized upper abdomen is notable for an 11 mm lesion along the posterior right hepatic dome (series 2/image 44), possibly peritoneal or pleural, unchanged from recent CT abdomen/pelvis.  Degenerative changes of the visualized thoracolumbar spine.  IMPRESSION: Irregular 9 mm right upper lobe pulmonary nodule, indeterminate. Additional small tiny bilateral pulmonary nodules.  This appearance is not specific for metastatic disease, and attention on follow-up is suggested.  (Note: the dominant nodule likely remains below the size threshold for PET sensitivity.)  Widespread pleural-based nodularity bilaterally, including a dominant 12 x 8 mm nodule overlying the lateral right upper lobe. Pleural metastases would be unusual for metastatic rectal cancer. Benign etiologies such as sarcoidosis could have a similar appearance.  Attention on follow-up is suggested.  8 mm short-axis right cardiophrenic angle node, below pathologic CT size criteria, indeterminate.   Original Report Authenticated By: Bertis Ruddy  [place] Pink, M.D.   Ct Abdomen Pelvis W Contrast  01/24/2013   *RADIOLOGY REPORT*  Clinical Data: Newly-diagnosed rectal mass.  30 pounds weight loss. Diarrhea.  CT ABDOMEN AND PELVIS WITH CONTRAST  Technique:  Multidetector CT imaging of the abdomen and pelvis was performed following the standard protocol during bolus administration of intravenous contrast.  Contrast: 183mL OMNIPAQUE IOHEXOL 300 MG/ML  SOLN  Comparison: None.  Findings: Lung bases:  Minimal subpleural nodularity at the left lower lobe on image 5/series 3.  Subtle thickening / nodularity of the left major fissure on image 3/series 3.  Mild cardiomegaly.  Trace right pleural fluid or thickening.  No pericardial effusion. Borderline enlarged 8 mm right cardiophrenic angle node on image 13 image 12/series 2.  Abdomen/pelvis:  7 mm subcapsular right hepatic lobe low density lesion image 23/series 2.  Normal spleen, stomach, pancreas, gallbladder, biliary tract, adrenal glands.  Interpolar right renal lesion measures 2.2 cm and 46 HU on image 40/series 2.  48 HU on kidney delayed images.  Other lesions which are too small to characterize.  Lower pole right renal lesion measures 8 mm and greater than fluid density on image 46/series 2.  Small retroperitoneal nodes, not pathologic by size criteria.  No colonic obstruction.  Scattered colonic diverticula. Normal terminal ileum and appendix.  Normal small bowel without abdominal ascites.    No evidence of omental or peritoneal disease.  The rectal primary is identified with eccentric left marked soft tissue thickening.  This measures up to 6.0 x 4.4 cm on image 82/series 2.  Contacts the posterior aspect of the prostate, and direct tumor involvement cannot be excluded.  Transmural spread with extension near the circumferential resection margin. Suboptimally evaluated by CT.  Adenopathy within the sigmoid mesocolon.  Index node measures 1.1 cm on image 66/series 2.  A separate area of apparent sigmoid wall thickening is favored to be due to peristalsis and underdistension on image 69/series 2  A left obturator node which measures 7 mm on image 76/series 2. Not pathologic by size criteria.  Normal urinary bladder.  Otherwise normal prostate, without significant free pelvic fluid.  Fat containing umbilical hernia.  Bones/Musculoskeletal:  Avascular necrosis within the left femoral head.  IMPRESSION:  1.  Locally advanced rectal carcinoma with direct extension into the mesorectum and possible involvement of the adjacent prostate. No obstruction. 2.  Adenopathy in the adjacent mesocolon, highly suspicious for nodal metastasis. 3.   Too small to characterize right liver lobe lesion.  This could be reevaluated at follow-up or on subsequent MRI. 4.  Interpolar right renal lesion which is indeterminate and could represent a complex cyst or solid renal neoplasm.  Concurrent smaller lower pole right renal lesion.  Consider further characterization with pre and post contrast abdominal MRI.  If this is not performed, pre and postcontrast imaging is recommended on subsequent CT. 5.  Prominent right cardiophrenic angle node and nodularity at the left lung base, favored to be benign/reactive. 6.  Left femoral head avascular necrosis.   Original Report Authenticated By: Abigail Miyamoto, M.D.       IMPRESSION: The patient is presenting with a locally advanced adenocarcinoma of the rectum: T3N2 clinically by ultrasound. He is a good candidate for neoadjuvant chemoradiotherapy. We therefore discussed a potential 5-1/2 week course of radiotherapy with him which would tentatively be scheduled to begin on 02/21/2013. He is also seeing Dr. Benay Spice with regards to potential concurrent chemoradiotherapy. We discussed the rationale of this treatment and the benefits of  treatment in addition to the possible side effects and risks. The patient does wish to proceed with this overall treatment plan.  His case is notable for some pulmonary nodules and pleural based nodularity which was seen on the CT scan of the chest. This was not felt to clearly indicate metastatic disease although attention on followup was recommended. The pulmonary nodules which would be the most suspicious for metastatic rectal cancer are likely below the level of PET sensitivity.   PLAN: The patient will proceed with a simulation as soon as this can be arranged early next week. Will plan to treat the patient with 5-1/2 weeks of radiotherapy beginning on 02/21/2013.    I spent 60 minutes minutes face to face with the patient and more than 50% of that time was spent in counseling and/or  coordination of care.    ________________________________   Jodelle Gross, MD, PhD

## 2013-02-14 ENCOUNTER — Other Ambulatory Visit: Payer: Self-pay | Admitting: *Deleted

## 2013-02-14 ENCOUNTER — Telehealth: Payer: Self-pay | Admitting: *Deleted

## 2013-02-14 ENCOUNTER — Ambulatory Visit
Admission: RE | Admit: 2013-02-14 | Discharge: 2013-02-14 | Disposition: A | Discharge status: Home / Self Care | Payer: Medicare Other | Source: Ambulatory Visit | Attending: Radiation Oncology | Admitting: Radiation Oncology

## 2013-02-14 DIAGNOSIS — R197 Diarrhea, unspecified: Secondary | ICD-10-CM | POA: Insufficient documentation

## 2013-02-14 DIAGNOSIS — Z510 Encounter for antineoplastic radiation therapy: Secondary | ICD-10-CM | POA: Insufficient documentation

## 2013-02-14 DIAGNOSIS — C20 Malignant neoplasm of rectum: Secondary | ICD-10-CM | POA: Insufficient documentation

## 2013-02-14 MED ORDER — CAPECITABINE 500 MG PO TABS: ORAL_TABLET | ORAL | Status: DC

## 2013-02-14 NOTE — Telephone Encounter (Signed)
Received phone call from patient inquiring about when he will receive his Xeloda pills.  He stated he starts radiation on 02/21/13.  This message given to Dr. Gearldine Shown nurse.  This nurse reminded patient to call back if he has not heard anything by 02/16/13.  He verbalized understanding.

## 2013-02-14 NOTE — Telephone Encounter (Signed)
Called patient to inform of test, spoke with patient and he is aware of this test. 

## 2013-02-15 ENCOUNTER — Encounter: Payer: Self-pay | Admitting: Oncology

## 2013-02-15 NOTE — Progress Notes (Signed)
Per Tamika at [facility] the patient has been approved for Xeloda  02/15/13-02/14/14 $7500.00. I will forward to billing and medical records.

## 2013-02-16 NOTE — Progress Notes (Signed)
  Radiation Oncology         [phone] ________________________________  Name: ROCCO BLANKLEY MRN: [id number]  Date: 02/14/2013  DOB: [birth year]  SIMULATION AND TREATMENT PLANNING NOTE  The patient presented for simulation for the patient's upcoming course of preoperative radiation for the diagnosis of rectal cancer. The patient was placed in a supine position. A customized alpha cradle was constructed toaid in patient immobilization. This complex treatment device will be used on a daily basis during the treatment. In this fashion a CT scan was obtained through the pelvic region and the isocenter was placed near midline within the pelvis.  The patient will initially be planned to receive a course of radiation to a dose of 45 gray. This will be accomplished in 25 fractions at 1.8 gray per fraction. This initial treatment will correspond to a 3-D conformal technique. The gross tumor volume has been contoured in addition to the rectum, bladder and femoral heads. DVH's of each of these structures have been requested and these will be carefully reviewed as part of the 3-D conformal treatment planning process. To accomplish this initial treatment, 4 customized blocks have been designed for this purpose. Each of these 4 complex treatment devices will be used on a daily basis during the initial course of his treatment. It is anticipated that the patient will then receive a boost for an additional 5.4 gray. The anticipated total dose therefore will be 50.4 gray.  Special treatment procedure The patient will receive chemotherapy during the course of radiation treatment. The patient may experience increased or overlapping toxicity due to this combined-modality approach and the patient will be monitored for such problems. This may include extra lab work as necessary. This therefore constitutes a special treatment procedure.    ________________________________  Jodelle Gross, MD, PhD

## 2013-02-17 ENCOUNTER — Ambulatory Visit (HOSPITAL_COMMUNITY)
Admission: RE | Admit: 2013-02-17 | Discharge: 2013-02-17 | Disposition: A | Discharge status: Home / Self Care | Payer: Medicare Other | Source: Ambulatory Visit | Attending: Radiation Oncology | Admitting: Radiation Oncology

## 2013-02-17 ENCOUNTER — Encounter (HOSPITAL_COMMUNITY): Payer: Self-pay

## 2013-02-17 DIAGNOSIS — C20 Malignant neoplasm of rectum: Secondary | ICD-10-CM | POA: Insufficient documentation

## 2013-02-17 DIAGNOSIS — R918 Other nonspecific abnormal finding of lung field: Secondary | ICD-10-CM | POA: Insufficient documentation

## 2013-02-17 MED ORDER — FLUDEOXYGLUCOSE F - 18 (FDG) INJECTION
17.3000 | Freq: Once | INTRAVENOUS | Status: AC | PRN
  Administered 2013-02-17: 17.3 via INTRAVENOUS

## 2013-02-17 NOTE — Telephone Encounter (Signed)
RECEIVED A FAX FROM [facility] CONCERNING A CONFIRMATION OF PRESCRIPTION SHIPMENT FOR CAPECITABINE ON 02/16/13.

## 2013-02-21 ENCOUNTER — Ambulatory Visit: Payer: Medicare Other | Admitting: Nutrition

## 2013-02-21 ENCOUNTER — Other Ambulatory Visit: Payer: Medicare Other

## 2013-02-21 ENCOUNTER — Ambulatory Visit
Admission: RE | Admit: 2013-02-21 | Discharge: 2013-02-21 | Disposition: A | Discharge status: Home / Self Care | Payer: Medicare Other | Source: Ambulatory Visit | Attending: Radiation Oncology | Admitting: Radiation Oncology

## 2013-02-21 DIAGNOSIS — C20 Malignant neoplasm of rectum: Secondary | ICD-10-CM

## 2013-02-21 NOTE — Progress Notes (Signed)
Patient is a 67 year old male diagnosed with rectal cancer.  He is a patient of Dr. Benay Spice and Dr. Lisbeth Renshaw.  Past medical history includes gout, hypertension, diverticulosis, anemia, arthritis, renal insufficiency.  Medications include: Xeloda  Labs were reviewed.  Height: 67 inches. Weight: 190.3 pounds.  Usual body weight: 208 pounds January 2014. BMI: 29.8.  Patient reports history of diarrhea, which is now resolved.  He reports about a 10 pound weight loss, but records reflect an 18 pound loss since January of 2014.  Patient denies nutrition side effects at this time.  He reports he is eating well.  Nutrition diagnosis: Food and nutrition related knowledge deficit related to new diagnosis of rectal cancer and associated treatments as evidenced by no prior need for nutrition related information.  Intervention: Patient and wife were educated on the importance of small, frequent meals with adequate calories and protein to promote weight maintenance and adequate healing.  I reviewed appropriate protein sources with patient.  I discussed strategies for eating if patient develops diarrhea.  I've stressed the importance of adequate hydration.  Patient was provided with fact sheets, coupons, and my contact information.  Teach back method used.  Monitoring, evaluation, goals: Patient will tolerate adequate calories and protein to promote weight maintenance and minimal side effects.  Next visit: Patient will contact me if he develops questions or concerns during treatment.

## 2013-02-21 NOTE — Progress Notes (Signed)
  Radiation Oncology         [phone] ________________________________  Name: Jason [name] MRN: [id number]  Date: 02/21/2013  DOB: 04-02-1946  Simulation Verification Note  Status: outpatient  NARRATIVE: The patient was brought to the treatment unit and placed in the planned treatment position. The clinical setup was verified. Then port films were obtained and uploaded to the radiation oncology medical record software.  The treatment beams were carefully compared against the planned radiation fields. The position location and shape of the radiation fields was reviewed. They targeted volume of tissue appears to be appropriately covered by the radiation beams. Organs at risk appear to be excluded as planned.  Based on my personal review, I approved the simulation verification. The patient's treatment will proceed as planned.  ------------------------------------------------  Sheral Apley Tammi Klippel, M.D.

## 2013-02-22 ENCOUNTER — Ambulatory Visit
Admission: RE | Admit: 2013-02-22 | Discharge: 2013-02-22 | Disposition: A | Discharge status: Home / Self Care | Payer: Medicare Other | Source: Ambulatory Visit | Attending: Radiation Oncology | Admitting: Radiation Oncology

## 2013-02-22 ENCOUNTER — Encounter: Payer: Self-pay | Admitting: Radiation Oncology

## 2013-02-22 VITALS — BP 129/79 | HR 85 | Temp 98.2°F | Resp 20 | Wt 191.8 lb

## 2013-02-22 DIAGNOSIS — C20 Malignant neoplasm of rectum: Secondary | ICD-10-CM

## 2013-02-22 NOTE — Progress Notes (Signed)
Weekly rad txs, rectum, 2//25 no c/o pain, appetite good, will do education tomorrow, patient agreeable 10:48 AM

## 2013-02-22 NOTE — Progress Notes (Signed)
   Department of Radiation Oncology  Phone:  [phone] Fax:        [phone]  Weekly Treatment Note    Name: Jason [name] Date: 02/22/2013 MRN: [id number] DOB: 1945/07/29   Current dose: 3.6 Gy  Current fraction: 2   MEDICATIONS: Current Outpatient Prescriptions  Medication Sig Dispense Refill  . allopurinol (ZYLOPRIM) 300 MG tablet Take 1 tablet (300 mg total) by mouth daily.  90 tablet  3  . amLODipine (NORVASC) 10 MG tablet 1 by mouth every morning  90 tablet  3  . atenolol-chlorthalidone (TENORETIC) 50-25 MG per tablet Take 1 tablet by mouth daily.  90 tablet  3  . capecitabine (XELODA) 500 MG tablet Take # 3 (1500 mg) po every am & #2 (1000 mg) po every pm on days of radiation only (Monday-Friday)  140 tablet  0  . doxazosin (CARDURA) 8 MG tablet Take 1 tablet (8 mg total) by mouth at bedtime.  100 tablet  3   No current facility-administered medications for this encounter.     ALLERGIES: Review of patient's allergies indicates no known allergies.   LABORATORY DATA:  Lab Results  Component Value Date   WBC 10.7* 01/21/2013   HGB 11.1* 01/21/2013   HCT 35.0* 01/21/2013   MCV 86.3 01/21/2013   PLT 337.0 01/21/2013   Lab Results  Component Value Date   NA 136 01/21/2013   K 3.5 01/21/2013   CL 98 01/21/2013   CO2 29 01/21/2013   Lab Results  Component Value Date   ALT 11 01/21/2013   AST 11 01/21/2013   ALKPHOS 68 01/21/2013   BILITOT 0.5 01/21/2013     NARRATIVE: Craige Cotta was seen today for weekly treatment management. The chart was checked and the patient's films were reviewed. The patient has begun his treatment. He is doing well. No complaints of pain or other significant complaints.  PHYSICAL EXAMINATION: weight is 191 lb 12.8 oz (87 kg). His oral temperature is 98.2 F (36.8 C). His blood pressure is 129/79 and his pulse is 85. His respiration is 20.        ASSESSMENT: The patient is doing satisfactorily with treatment.  PLAN: We will continue  with the patient's radiation treatment as planned.

## 2013-02-23 ENCOUNTER — Ambulatory Visit
Admission: RE | Admit: 2013-02-23 | Discharge: 2013-02-23 | Disposition: A | Discharge status: Home / Self Care | Payer: Medicare Other | Source: Ambulatory Visit | Attending: Radiation Oncology | Admitting: Radiation Oncology

## 2013-02-23 DIAGNOSIS — C20 Malignant neoplasm of rectum: Secondary | ICD-10-CM

## 2013-02-23 NOTE — Progress Notes (Signed)
Education today with Jason [name] regarding side-effect management for his rectal cancer.  Reviewed management of rectal pain/irritation, dysuria, diarrhea/loose stools, skin irritation in the rectal and groin regions, and potentially nausea.  Advised to not apply any creams lotion or powders in the pelvic and rectal areas while he is receiving radiation therapy and to switch out his Dial soap for Dove Sensitive skin.  Given a sitz bath with instructions on use.  Informed that Dr. Lisbeth Renshaw regularly sees all of his patient's, every Friday, following treatment, but if there is a change he will be notified in advance   Given the Radiation Therapy and You booklet with the appropriate pages marked.  Given this RN's business card.

## 2013-02-24 ENCOUNTER — Ambulatory Visit
Admission: RE | Admit: 2013-02-24 | Discharge: 2013-02-24 | Disposition: A | Discharge status: Home / Self Care | Payer: Medicare Other | Source: Ambulatory Visit | Attending: Radiation Oncology | Admitting: Radiation Oncology

## 2013-02-25 ENCOUNTER — Ambulatory Visit
Admission: RE | Admit: 2013-02-25 | Discharge: 2013-02-25 | Disposition: A | Discharge status: Home / Self Care | Payer: Medicare Other | Source: Ambulatory Visit | Attending: Radiation Oncology | Admitting: Radiation Oncology

## 2013-02-28 ENCOUNTER — Ambulatory Visit
Admission: RE | Admit: 2013-02-28 | Discharge: 2013-02-28 | Disposition: A | Discharge status: Home / Self Care | Payer: Medicare Other | Source: Ambulatory Visit | Attending: Radiation Oncology | Admitting: Radiation Oncology

## 2013-03-01 ENCOUNTER — Ambulatory Visit
Admission: RE | Admit: 2013-03-01 | Discharge: 2013-03-01 | Disposition: A | Discharge status: Home / Self Care | Payer: Medicare Other | Source: Ambulatory Visit | Attending: Radiation Oncology | Admitting: Radiation Oncology

## 2013-03-02 ENCOUNTER — Ambulatory Visit
Admission: RE | Admit: 2013-03-02 | Discharge: 2013-03-02 | Disposition: A | Discharge status: Home / Self Care | Payer: Medicare Other | Source: Ambulatory Visit | Attending: Radiation Oncology | Admitting: Radiation Oncology

## 2013-03-03 ENCOUNTER — Ambulatory Visit
Admission: RE | Admit: 2013-03-03 | Discharge: 2013-03-03 | Disposition: A | Discharge status: Home / Self Care | Payer: Medicare Other | Source: Ambulatory Visit | Attending: Radiation Oncology | Admitting: Radiation Oncology

## 2013-03-04 ENCOUNTER — Ambulatory Visit
Admission: RE | Admit: 2013-03-04 | Discharge: 2013-03-04 | Disposition: A | Discharge status: Home / Self Care | Payer: Medicare Other | Source: Ambulatory Visit | Attending: Radiation Oncology | Admitting: Radiation Oncology

## 2013-03-04 ENCOUNTER — Encounter: Payer: Self-pay | Admitting: Radiation Oncology

## 2013-03-04 VITALS — BP 112/71 | HR 75 | Temp 98.2°F | Ht 67.0 in | Wt 193.4 lb

## 2013-03-04 DIAGNOSIS — C20 Malignant neoplasm of rectum: Secondary | ICD-10-CM

## 2013-03-04 NOTE — Progress Notes (Signed)
  Radiation Oncology         [phone] ________________________________  Name: Jason [name] MRN: [id number]  Date: 03/04/2013  DOB: 1945-11-23  Weekly Radiation Therapy Management  Current Dose: 10 Gy     Planned Dose:  45 Gy  Narrative . . . . . . . . The patient presents for routine under treatment assessment.  Jason [name] here with his wfie for weekly under treat visit. He has had 10 fractions to his rectum. He denies pain. He has been having diarrhea with about 3 loose stools per day. He is taking 2 tablets of Imodium which stops the diarrhea. He denies nausea. He denies fatigue. He does have urinary frequency but thinks it is due to his blood pressure medicine                                Set-up films were reviewed.                                 The chart was checked. Physical Findings. . .  height is 5\' 7"  (1.702 m) and weight is 193 lb 6.4 oz (87.726 kg). His temperature is 98.2 F (36.8 C). His blood pressure is 112/71 and his pulse is 75. . Weight essentially stable.  No significant changes. Impression . . . . . . . The patient is  tolerating radiation. Plan . . . . . . . . . . . . Continue treatment as planned.  ________________________________  Jason [name]. Jason [name], M.D.

## 2013-03-04 NOTE — Progress Notes (Signed)
Jason [name] here with his wfie for weekly under treat visit.  He has had 10 fractions to his rectum.  He denies pain.  He has been having diarrhea with about 3 loose stools per day.  He is taking 2 tablets of Imodium which stops the diarrhea.  He denies nausea.  He denies fatigue.  He does have urinary frequency but thinks it is due to his blood pressure medicine.

## 2013-03-08 ENCOUNTER — Ambulatory Visit
Admission: RE | Admit: 2013-03-08 | Discharge: 2013-03-08 | Disposition: A | Discharge status: Home / Self Care | Payer: Medicare Other | Source: Ambulatory Visit | Attending: Radiation Oncology | Admitting: Radiation Oncology

## 2013-03-09 ENCOUNTER — Ambulatory Visit
Admission: RE | Admit: 2013-03-09 | Discharge: 2013-03-09 | Disposition: A | Discharge status: Home / Self Care | Payer: Medicare Other | Source: Ambulatory Visit | Attending: Radiation Oncology | Admitting: Radiation Oncology

## 2013-03-10 ENCOUNTER — Other Ambulatory Visit (HOSPITAL_BASED_OUTPATIENT_CLINIC_OR_DEPARTMENT_OTHER): Payer: Medicare Other | Admitting: Lab

## 2013-03-10 ENCOUNTER — Ambulatory Visit (HOSPITAL_BASED_OUTPATIENT_CLINIC_OR_DEPARTMENT_OTHER): Payer: Medicare Other | Admitting: Oncology

## 2013-03-10 ENCOUNTER — Ambulatory Visit
Admission: RE | Admit: 2013-03-10 | Discharge: 2013-03-10 | Disposition: A | Discharge status: Home / Self Care | Payer: Medicare Other | Source: Ambulatory Visit | Attending: Radiation Oncology | Admitting: Radiation Oncology

## 2013-03-10 ENCOUNTER — Telehealth: Payer: Self-pay

## 2013-03-10 VITALS — BP 128/68 | HR 77 | Temp 97.6°F | Resp 18 | Ht 67.0 in | Wt 193.7 lb

## 2013-03-10 DIAGNOSIS — D649 Anemia, unspecified: Secondary | ICD-10-CM

## 2013-03-10 DIAGNOSIS — C20 Malignant neoplasm of rectum: Secondary | ICD-10-CM

## 2013-03-10 DIAGNOSIS — N289 Disorder of kidney and ureter, unspecified: Secondary | ICD-10-CM

## 2013-03-10 DIAGNOSIS — I10 Essential (primary) hypertension: Secondary | ICD-10-CM

## 2013-03-10 DIAGNOSIS — K625 Hemorrhage of anus and rectum: Secondary | ICD-10-CM

## 2013-03-10 DIAGNOSIS — R911 Solitary pulmonary nodule: Secondary | ICD-10-CM

## 2013-03-10 LAB — CBC WITH DIFFERENTIAL/PLATELET
BASO%: 0.3 % (ref 0.0–2.0)
Basophils Absolute: 0 10*3/uL (ref 0.0–0.1)
HCT: 31.1 % — ABNORMAL LOW (ref 38.4–49.9)
LYMPH%: 7.9 % — ABNORMAL LOW (ref 14.0–49.0)
MCH: 27.3 pg (ref 27.2–33.4)
MCHC: 32.5 g/dL (ref 32.0–36.0)
MONO#: 0.6 10*3/uL (ref 0.1–0.9)
NEUT%: 76.9 % — ABNORMAL HIGH (ref 39.0–75.0)
Platelets: 187 10*3/uL (ref 140–400)

## 2013-03-10 LAB — BASIC METABOLIC PANEL (CC13)
BUN: 26.3 mg/dL — ABNORMAL HIGH (ref 7.0–26.0)
CO2: 27 mEq/L (ref 22–29)
Calcium: 9.9 mg/dL (ref 8.4–10.4)
Creatinine: 1.8 mg/dL — ABNORMAL HIGH (ref 0.7–1.3)

## 2013-03-10 NOTE — Telephone Encounter (Signed)
gv and printed appt sched and avs for pt for SEpt. °

## 2013-03-10 NOTE — Progress Notes (Signed)
Met with patient and his wife.  He states he currently has no barriers to care and has been receiving his XRT and Xeloda without difficulty.  He has not encountered any problems with receiving his Xeloda.  He has an appointment already scheduled with surgeon post neoadjuvant tx.  He understands to call for assist as needed.  Will continue to follow.

## 2013-03-10 NOTE — Progress Notes (Signed)
   [place]    OFFICE PROGRESS NOTE   INTERVAL HISTORY:   He returns as scheduled. He began concurrent Xeloda and radiation on 02/21/2013. He is tolerating treatment well. No mouth sores, nausea, or hand/foot pain. Diarrhea has improved. He feels well.  Objective:  Vital signs in last 24 hours:  Blood pressure 128/68, pulse 77, temperature 97.6 F (36.4 C), temperature source Oral, resp. rate 18, height 5\' 7"  (1.702 m), weight 193 lb 11.2 oz (87.862 kg).    HEENT: No thrush or ulcers Resp: Lungs clear bilaterally Cardio: Regular rate and rhythm GI: No hepatosplenomegaly Vascular: No leg edema  Skin: Mild hyperpigmentation of the palms, no erythema or skin breakdown. No erythema at the groin or perineum     Lab Results:  Lab Results  Component Value Date   WBC 5.0 03/10/2013   HGB 10.1* 03/10/2013   HCT 31.1* 03/10/2013   MCV 83.9 03/10/2013   PLT 187 03/10/2013   ANC 3.8    Medications: I have reviewed the patient's current medications.  Assessment/Plan: 1. Rectal, clinical stage III (uT3,uN2)  -Initiation of concurrent Xeloda and radiation on 02/21/2013 2. Markedly elevated CEA  3. Indeterminate pulmonary/subpleural nodules and right hepatic dome lesion  4. Renal insufficiency  5. Anemia-? Secondary to renal insufficiency or rectal bleeding  6. Hypertension  7. Gout    Disposition:  He appears to be tolerating the chemotherapy and radiation well. Mr. Chessman will contact us for new symptoms. He will return for an office visit in 2 weeks.   Betsy Coder, MD  03/10/2013  10:44 AM

## 2013-03-11 ENCOUNTER — Ambulatory Visit
Admission: RE | Admit: 2013-03-11 | Discharge: 2013-03-11 | Disposition: A | Discharge status: Home / Self Care | Payer: Medicare Other | Source: Ambulatory Visit | Attending: Radiation Oncology | Admitting: Radiation Oncology

## 2013-03-11 ENCOUNTER — Encounter: Payer: Self-pay | Admitting: *Deleted

## 2013-03-11 ENCOUNTER — Telehealth: Payer: Self-pay | Admitting: *Deleted

## 2013-03-11 VITALS — BP 108/63 | HR 78 | Temp 98.4°F | Ht 67.0 in | Wt 193.3 lb

## 2013-03-11 DIAGNOSIS — C20 Malignant neoplasm of rectum: Secondary | ICD-10-CM

## 2013-03-11 NOTE — Progress Notes (Signed)
   Department of Radiation Oncology  Phone:  [phone] Fax:        [phone]  Weekly Treatment Note    Name: Jason [name] Date: 03/11/2013 MRN: [id number] DOB: 1945-12-23   Current dose: 25.2 Gy  Current fraction:14   MEDICATIONS: Current Outpatient Prescriptions  Medication Sig Dispense Refill  . allopurinol (ZYLOPRIM) 300 MG tablet Take 1 tablet (300 mg total) by mouth daily.  90 tablet  3  . amLODipine (NORVASC) 10 MG tablet 1 by mouth every morning  90 tablet  3  . atenolol-chlorthalidone (TENORETIC) 50-25 MG per tablet Take 1 tablet by mouth daily.  90 tablet  3  . capecitabine (XELODA) 500 MG tablet Take # 3 (1500 mg) po every am & #2 (1000 mg) po every pm on days of radiation only (Monday-Friday)  140 tablet  0  . doxazosin (CARDURA) 8 MG tablet Take 1 tablet (8 mg total) by mouth at bedtime.  100 tablet  3  . loperamide (IMODIUM A-D) 2 MG tablet Take 2 mg by mouth 4 (four) times daily as needed for diarrhea or loose stools.       No current facility-administered medications for this encounter.     ALLERGIES: Review of patient's allergies indicates no known allergies.   LABORATORY DATA:  Lab Results  Component Value Date   WBC 5.0 03/10/2013   HGB 10.1* 03/10/2013   HCT 31.1* 03/10/2013   MCV 83.9 03/10/2013   PLT 187 03/10/2013   Lab Results  Component Value Date   NA 139 03/10/2013   K 3.3* 03/10/2013   CL 98 01/21/2013   CO2 27 03/10/2013   Lab Results  Component Value Date   ALT 11 01/21/2013   AST 11 01/21/2013   ALKPHOS 68 01/21/2013   BILITOT 0.5 01/21/2013     NARRATIVE: Jason [name] was seen today for weekly treatment management. The chart was checked and the patient's films were reviewed. The patient states that he is doing well at this time. He denies any diarrhea or nausea. No irritation in the rectal area currently.  PHYSICAL EXAMINATION: height is 5\' 7"  (1.702 m) and weight is 193 lb 4.8 oz (87.68 kg). His temperature is 98.4 F (36.9 C). His blood  pressure is 108/63 and his pulse is 78.        ASSESSMENT: The patient is doing satisfactorily with treatment.  PLAN: We will continue with the patient's radiation treatment as planned. The patient will begin using sitz baths if he begins experiencing some irritation over the next week.

## 2013-03-11 NOTE — Progress Notes (Signed)
RECEIVED A FAX FROM [facility] CONCERNING A CONFIRMATION OF PRESCRIPTION SHIPMENT FOR CAPECITABINE ON 03/10/13.

## 2013-03-11 NOTE — Progress Notes (Signed)
Jason [name] here for weekly under treat visit.  He has had 14 fractions to his rectum.  He denies pain and fatigue.  He reports that he has not had diarrhea or nausea.  He states that he does not have any irritation in his rectal area.  He has a sitz bath but has not had to use it.

## 2013-03-11 NOTE — Telephone Encounter (Signed)
Message copied by Brien Few on Fri Mar 11, 2013  9:42 AM ------      Message from: Betsy Coder B      Created: Thu Mar 10, 2013  9:25 PM       Check bmet 1 week ------

## 2013-03-14 ENCOUNTER — Telehealth: Payer: Self-pay | Admitting: Oncology

## 2013-03-14 ENCOUNTER — Ambulatory Visit
Admission: RE | Admit: 2013-03-14 | Discharge: 2013-03-14 | Disposition: A | Discharge status: Home / Self Care | Payer: Medicare Other | Source: Ambulatory Visit | Attending: Radiation Oncology | Admitting: Radiation Oncology

## 2013-03-14 NOTE — Telephone Encounter (Signed)
Talked to pt ands gave him appt for 9/11, lab only

## 2013-03-15 ENCOUNTER — Ambulatory Visit
Admission: RE | Admit: 2013-03-15 | Discharge: 2013-03-15 | Disposition: A | Discharge status: Home / Self Care | Payer: Medicare Other | Source: Ambulatory Visit | Attending: Radiation Oncology | Admitting: Radiation Oncology

## 2013-03-16 ENCOUNTER — Ambulatory Visit
Admission: RE | Admit: 2013-03-16 | Discharge: 2013-03-16 | Disposition: A | Discharge status: Home / Self Care | Payer: Medicare Other | Source: Ambulatory Visit | Attending: Radiation Oncology | Admitting: Radiation Oncology

## 2013-03-17 ENCOUNTER — Other Ambulatory Visit (HOSPITAL_BASED_OUTPATIENT_CLINIC_OR_DEPARTMENT_OTHER): Payer: Medicare Other | Admitting: Lab

## 2013-03-17 ENCOUNTER — Ambulatory Visit
Admission: RE | Admit: 2013-03-17 | Discharge: 2013-03-17 | Disposition: A | Discharge status: Home / Self Care | Payer: Medicare Other | Source: Ambulatory Visit | Attending: Radiation Oncology | Admitting: Radiation Oncology

## 2013-03-17 DIAGNOSIS — N289 Disorder of kidney and ureter, unspecified: Secondary | ICD-10-CM

## 2013-03-17 DIAGNOSIS — C20 Malignant neoplasm of rectum: Secondary | ICD-10-CM

## 2013-03-17 DIAGNOSIS — D649 Anemia, unspecified: Secondary | ICD-10-CM

## 2013-03-17 LAB — COMPREHENSIVE METABOLIC PANEL (CC13)
ALT: 7 U/L (ref 0–55)
AST: 11 U/L (ref 5–34)
Alkaline Phosphatase: 78 U/L (ref 40–150)
Calcium: 9.7 mg/dL (ref 8.4–10.4)
Chloride: 101 mEq/L (ref 98–109)
Creatinine: 1.9 mg/dL — ABNORMAL HIGH (ref 0.7–1.3)
Total Bilirubin: 0.26 mg/dL (ref 0.20–1.20)

## 2013-03-17 LAB — CBC WITH DIFFERENTIAL/PLATELET
BASO%: 0.4 % (ref 0.0–2.0)
Basophils Absolute: 0 10*3/uL (ref 0.0–0.1)
EOS%: 1.9 % (ref 0.0–7.0)
HCT: 30.8 % — ABNORMAL LOW (ref 38.4–49.9)
HGB: 9.9 g/dL — ABNORMAL LOW (ref 13.0–17.1)
LYMPH%: 5.7 % — ABNORMAL LOW (ref 14.0–49.0)
MCH: 27.2 pg (ref 27.2–33.4)
MCHC: 32.1 g/dL (ref 32.0–36.0)
MCV: 84.5 fL (ref 79.3–98.0)
MONO%: 8.7 % (ref 0.0–14.0)
NEUT%: 83.3 % — ABNORMAL HIGH (ref 39.0–75.0)
Platelets: 183 10*3/uL (ref 140–400)

## 2013-03-18 ENCOUNTER — Telehealth: Payer: Self-pay | Admitting: Oncology

## 2013-03-18 ENCOUNTER — Ambulatory Visit
Admission: RE | Admit: 2013-03-18 | Discharge: 2013-03-18 | Disposition: A | Discharge status: Home / Self Care | Payer: Medicare Other | Source: Ambulatory Visit | Attending: Radiation Oncology | Admitting: Radiation Oncology

## 2013-03-18 ENCOUNTER — Other Ambulatory Visit: Payer: Self-pay | Admitting: *Deleted

## 2013-03-18 ENCOUNTER — Telehealth: Payer: Self-pay | Admitting: *Deleted

## 2013-03-18 VITALS — BP 108/57 | HR 72 | Temp 98.4°F | Ht 67.0 in | Wt 193.2 lb

## 2013-03-18 DIAGNOSIS — C20 Malignant neoplasm of rectum: Secondary | ICD-10-CM

## 2013-03-18 MED ORDER — POTASSIUM CHLORIDE CRYS ER 20 MEQ PO TBCR: 20.0000 meq | EXTENDED_RELEASE_TABLET | Freq: Every day | ORAL | Status: DC

## 2013-03-18 NOTE — Progress Notes (Signed)
Jason [name] here with his wife for weekly under treat visit.  He has had 19 fractions to his rectum.  He denies pain and fatigue.  He reports that he hasn't had diarrhea for a couple of days.  He denies nausea and his appetite is good.  He has not lost any weight.  He denies skin irritation in the rectal area and urinary changes.

## 2013-03-18 NOTE — Telephone Encounter (Signed)
Message copied by Domenic Schwab on Fri Mar 18, 2013  3:50 PM ------      Message from: Betsy Coder B      Created: Thu Mar 17, 2013  9:21 PM       Please call patient, start kcl 20meq daily, check bmet next 2 weeks ------

## 2013-03-18 NOTE — Telephone Encounter (Signed)
Notified pt that potassium is low and needs to start KCL 20 meq --take 1 daily and need to re-check labs in 2 weeks.  Pt verbalized understanding of instructions.  POF completed

## 2013-03-18 NOTE — Telephone Encounter (Signed)
Talked to tp and he is aware of appt on 9/19 lab and MD

## 2013-03-18 NOTE — Progress Notes (Signed)
   Department of Radiation Oncology  Phone:  [phone] Fax:        [phone]  Weekly Treatment Note    Name: Jason [name] Date: 03/18/2013 MRN: [id number] DOB: 12/02/1945   Current dose: 34.2 Gy  Current fraction: 19   MEDICATIONS: Current Outpatient Prescriptions  Medication Sig Dispense Refill  . allopurinol (ZYLOPRIM) 300 MG tablet Take 1 tablet (300 mg total) by mouth daily.  90 tablet  3  . amLODipine (NORVASC) 10 MG tablet 1 by mouth every morning  90 tablet  3  . atenolol-chlorthalidone (TENORETIC) 50-25 MG per tablet Take 1 tablet by mouth daily.  90 tablet  3  . capecitabine (XELODA) 500 MG tablet Take # 3 (1500 mg) po every am & #2 (1000 mg) po every pm on days of radiation only (Monday-Friday)  140 tablet  0  . doxazosin (CARDURA) 8 MG tablet Take 1 tablet (8 mg total) by mouth at bedtime.  100 tablet  3  . loperamide (IMODIUM A-D) 2 MG tablet Take 2 mg by mouth 4 (four) times daily as needed for diarrhea or loose stools.       No current facility-administered medications for this encounter.     ALLERGIES: Review of patient's allergies indicates no known allergies.   LABORATORY DATA:  Lab Results  Component Value Date   WBC 3.7* 03/17/2013   HGB 9.9* 03/17/2013   HCT 30.8* 03/17/2013   MCV 84.5 03/17/2013   PLT 183 03/17/2013   Lab Results  Component Value Date   NA 139 03/17/2013   K 3.2* 03/17/2013   CL 98 01/21/2013   CO2 28 03/17/2013   Lab Results  Component Value Date   ALT 7 03/17/2013   AST 11 03/17/2013   ALKPHOS 78 03/17/2013   BILITOT 0.26 03/17/2013     NARRATIVE: Jason [name] was seen today for weekly treatment management. The chart was checked and the patient's films were reviewed. The patient is doing great today. No complaints. No diarrhea over the last couple of days.  PHYSICAL EXAMINATION: height is 5\' 7"  ([id number] m) and weight is 193 lb 3.2 oz (87.635 kg). His temperature is 98.4 F (36.9 C). His blood pressure is 108/57 and his  pulse is 72.        ASSESSMENT: The patient is doing satisfactorily with treatment.  PLAN: We will continue with the patient's radiation treatment as planned.

## 2013-03-21 ENCOUNTER — Ambulatory Visit
Admission: RE | Admit: 2013-03-21 | Discharge: 2013-03-21 | Disposition: A | Discharge status: Home / Self Care | Payer: Medicare Other | Source: Ambulatory Visit | Attending: Radiation Oncology | Admitting: Radiation Oncology

## 2013-03-22 ENCOUNTER — Ambulatory Visit
Admission: RE | Admit: 2013-03-22 | Discharge: 2013-03-22 | Disposition: A | Discharge status: Home / Self Care | Payer: Medicare Other | Source: Ambulatory Visit | Attending: Radiation Oncology | Admitting: Radiation Oncology

## 2013-03-23 ENCOUNTER — Ambulatory Visit
Admission: RE | Admit: 2013-03-23 | Discharge: 2013-03-23 | Disposition: A | Discharge status: Home / Self Care | Payer: Medicare Other | Source: Ambulatory Visit | Attending: Radiation Oncology | Admitting: Radiation Oncology

## 2013-03-24 ENCOUNTER — Encounter: Payer: Self-pay | Admitting: Radiation Oncology

## 2013-03-24 ENCOUNTER — Ambulatory Visit
Admission: RE | Admit: 2013-03-24 | Discharge: 2013-03-24 | Disposition: A | Discharge status: Home / Self Care | Payer: Medicare Other | Source: Ambulatory Visit | Attending: Radiation Oncology | Admitting: Radiation Oncology

## 2013-03-25 ENCOUNTER — Other Ambulatory Visit: Payer: Self-pay | Admitting: *Deleted

## 2013-03-25 ENCOUNTER — Telehealth: Payer: Self-pay | Admitting: Oncology

## 2013-03-25 ENCOUNTER — Ambulatory Visit
Admission: RE | Admit: 2013-03-25 | Discharge: 2013-03-25 | Disposition: A | Discharge status: Home / Self Care | Payer: Medicare Other | Source: Ambulatory Visit | Attending: Radiation Oncology | Admitting: Radiation Oncology

## 2013-03-25 ENCOUNTER — Telehealth: Payer: Self-pay | Admitting: *Deleted

## 2013-03-25 ENCOUNTER — Encounter: Payer: Self-pay | Admitting: Radiation Oncology

## 2013-03-25 ENCOUNTER — Ambulatory Visit (HOSPITAL_BASED_OUTPATIENT_CLINIC_OR_DEPARTMENT_OTHER): Payer: Medicare Other | Admitting: Nurse Practitioner

## 2013-03-25 ENCOUNTER — Other Ambulatory Visit (HOSPITAL_BASED_OUTPATIENT_CLINIC_OR_DEPARTMENT_OTHER): Payer: Medicare Other | Admitting: Lab

## 2013-03-25 VITALS — BP 106/60 | HR 73 | Temp 96.7°F | Resp 19 | Ht 67.0 in | Wt 190.4 lb

## 2013-03-25 VITALS — BP 116/74 | HR 80 | Temp 97.7°F | Resp 20 | Wt 191.1 lb

## 2013-03-25 DIAGNOSIS — C20 Malignant neoplasm of rectum: Secondary | ICD-10-CM

## 2013-03-25 LAB — CBC WITH DIFFERENTIAL/PLATELET
BASO%: 0.3 % (ref 0.0–2.0)
Basophils Absolute: 0 10*3/uL (ref 0.0–0.1)
EOS%: 0.9 % (ref 0.0–7.0)
MCH: 27.7 pg (ref 27.2–33.4)
MCHC: 32.8 g/dL (ref 32.0–36.0)
MCV: 84.6 fL (ref 79.3–98.0)
MONO%: 13.5 % (ref 0.0–14.0)
RDW: 19.7 % — ABNORMAL HIGH (ref 11.0–14.6)
lymph#: 0.2 10*3/uL — ABNORMAL LOW (ref 0.9–3.3)

## 2013-03-25 NOTE — Progress Notes (Signed)
   Department of Radiation Oncology  Phone:  [phone] Fax:        [phone]  Weekly Treatment Note    Name: Jason [name] Date: 03/25/2013 MRN: [id number] DOB: 26-Feb-1946   Current dose: 43.2 Gy  Current fraction: 24   MEDICATIONS: Current Outpatient Prescriptions  Medication Sig Dispense Refill  . allopurinol (ZYLOPRIM) 300 MG tablet Take 1 tablet (300 mg total) by mouth daily.  90 tablet  3  . amLODipine (NORVASC) 10 MG tablet 1 by mouth every morning  90 tablet  3  . atenolol-chlorthalidone (TENORETIC) 50-25 MG per tablet Take 1 tablet by mouth daily.  90 tablet  3  . capecitabine (XELODA) 500 MG tablet Take # 3 (1500 mg) po every am & #2 (1000 mg) po every pm on days of radiation only (Monday-Friday)  140 tablet  0  . doxazosin (CARDURA) 8 MG tablet Take 1 tablet (8 mg total) by mouth at bedtime.  100 tablet  3  . loperamide (IMODIUM A-D) 2 MG tablet Take 2 mg by mouth 4 (four) times daily as needed for diarrhea or loose stools.      . potassium chloride SA (K-DUR,KLOR-CON) 20 MEQ tablet Take 1 tablet (20 mEq total) by mouth daily.  30 tablet  0   No current facility-administered medications for this encounter.     ALLERGIES: Review of patient's allergies indicates no known allergies.   LABORATORY DATA:  Lab Results  Component Value Date   WBC 3.7* 03/17/2013   HGB 9.9* 03/17/2013   HCT 30.8* 03/17/2013   MCV 84.5 03/17/2013   PLT 183 03/17/2013   Lab Results  Component Value Date   NA 139 03/17/2013   K 3.2* 03/17/2013   CL 98 01/21/2013   CO2 28 03/17/2013   Lab Results  Component Value Date   ALT 7 03/17/2013   AST 11 03/17/2013   ALKPHOS 78 03/17/2013   BILITOT 0.26 03/17/2013     NARRATIVE: Jason [name] was seen today for weekly treatment management. The chart was checked and the patient's films were reviewed. The patient is doing very well at this time. The patient's skin irritation he states has improved. No nausea and no diarrhea.  PHYSICAL  EXAMINATION: weight is 191 lb 1.6 oz (86.682 kg). His oral temperature is 97.7 F (36.5 C). His blood pressure is 116/74 and his pulse is 80. His respiration is 20.        ASSESSMENT: The patient is doing satisfactorily with treatment.  PLAN: We will continue with the patient's radiation treatment as planned.

## 2013-03-25 NOTE — Progress Notes (Signed)
  Radiation Oncology         [phone] ________________________________  Name: Jason [name] MRN: [id number]  Date: 03/24/2013  DOB: 1946/03/09  COMPLEX SIMULATION  NOTE  Diagnosis: rectal cancer  Narrative The patient has initially been planned to receive a course of radiation treatment to a dose of 45 gray in 25 fractions at 1.8 gray per fraction. The patient will now receive a boost to the high risk target volume for an additional 5.4 gray. This will be delivered in 3 fractions at 1.8 gray per fraction and a cone down boost technique will be utilized. To accomplish this, an additional 4 customized blocks have been designed for this purpose. A complex isodose plan is requested to ensure that the high-risk target region receives the appropriate radiation dose and that the nearby normal structures continue to be appropriately spared. The patient's final total dose therefore will be 50.4 gray.   ________________________________ ------------------------------------------------  Jodelle Gross, MD, PhD

## 2013-03-25 NOTE — Progress Notes (Signed)
OFFICE PROGRESS NOTE  Interval history:  Mr. Cheuk is a 67 year old man recently diagnosed with rectal cancer. He began concurrent Xeloda and radiation on 02/21/2013. He is seen today for scheduled followup.  He has noted improvement in diarrhea since beginning treatment. He now has occasional loose stools. He has taken no Imodium for the past 2 days. He denies rectal bleeding. He denies nausea/vomiting. No mouth sores. No hand or foot pain or redness. No skin rash. He notes some scattered areas of hyperpigmentation on his hands. He denies pain. He has a good appetite. No shortness of breath or cough.   Objective: Blood pressure 106/60, pulse 73, temperature 96.7 F (35.9 C), temperature source Oral, resp. rate 19, height 5\' 7"  (1.702 m), weight 190 lb 6.4 oz (86.365 kg).  Oropharynx is without thrush or ulceration. Lungs are clear. Regular cardiac rhythm. Abdomen is soft and nontender. No organomegaly. Extremities are without edema. Palms are without erythema. Scattered areas of hyperpigmentation on the palms. No erythema at the perineum.  Lab Results: Lab Results  Component Value Date   WBC 5.9 03/25/2013   HGB 9.9* 03/25/2013   HCT 30.3* 03/25/2013   MCV 84.6 03/25/2013   PLT 189 03/25/2013    Chemistry:    Chemistry      Component Value Date/Time   NA 139 03/17/2013 0832   NA 136 01/21/2013 1142   K 3.2* 03/17/2013 0832   K 3.5 01/21/2013 1142   CL 98 01/21/2013 1142   CL 101 12/09/2012 1048   CO2 28 03/17/2013 0832   CO2 29 01/21/2013 1142   BUN 28.4* 03/17/2013 0832   BUN 17 01/21/2013 1142   CREATININE 1.9* 03/17/2013 0832   CREATININE 1.6* 01/21/2013 1142      Component Value Date/Time   CALCIUM 9.7 03/17/2013 0832   CALCIUM 9.5 01/21/2013 1142   ALKPHOS 78 03/17/2013 0832   ALKPHOS 68 01/21/2013 1142   AST 11 03/17/2013 0832   AST 11 01/21/2013 1142   ALT 7 03/17/2013 0832   ALT 11 01/21/2013 1142   BILITOT 0.26 03/17/2013 0832   BILITOT 0.5 01/21/2013 1142        Studies/Results: No results found.  Medications: I have reviewed the patient's current medications.  Assessment/Plan:  1. Rectal cancer, clinical stage III (uT3, uN2).   Initiation of concurrent Xeloda and radiation on 02/21/2013. 2. Markedly elevated CEA. 3. Indeterminate pulmonary/subpleural nodules and right hepatic dome lesion. 4. Renal insufficiency. 5. Anemia. Question secondary to renal insufficiency or rectal bleeding. Stable. 6. Hypertension. 7. Gout.  Disposition-Mr. Shipper appears to be tolerating the chemotherapy and radiation well. He will complete the scheduled course of radiation on 03/31/2013. He understands to discontinue Xeloda coinciding with the completion of radiation.  We will obtain a repeat CEA and CT scans of the chest/abdomen/pelvis in mid October at an approximate three-month interval from the previous.  He will return for a followup visit on 04/28/2013. He will contact the office in the interim with any problems.  Plan reviewed with Dr. Benay Spice.  Ned Card ANP/GNP-BC

## 2013-03-25 NOTE — Telephone Encounter (Signed)
Gave pt appt for lab and MD , gave pt oral contrast for October 2014

## 2013-03-25 NOTE — Progress Notes (Signed)
Weekly rad txs rectal, 24 completed, eating well, no nause, diarrhe, no pain stated patient, on Xeloda, no fatigue 9:17 AM

## 2013-03-25 NOTE — Telephone Encounter (Signed)
THIS REFILL REQUEST FOR CAPECITABINE WAS PLACED ON DR.SHERRILL'S DESK. 

## 2013-03-25 NOTE — Telephone Encounter (Signed)
Called pt, he reports he has enough Xeloda to complete chemoradiation. Faxed note to [facility].

## 2013-03-28 ENCOUNTER — Ambulatory Visit
Admission: RE | Admit: 2013-03-28 | Discharge: 2013-03-28 | Disposition: A | Discharge status: Home / Self Care | Payer: Medicare Other | Source: Ambulatory Visit | Attending: Radiation Oncology | Admitting: Radiation Oncology

## 2013-03-29 ENCOUNTER — Ambulatory Visit
Admission: RE | Admit: 2013-03-29 | Discharge: 2013-03-29 | Disposition: A | Discharge status: Home / Self Care | Payer: Medicare Other | Source: Ambulatory Visit | Attending: Radiation Oncology | Admitting: Radiation Oncology

## 2013-03-29 ENCOUNTER — Encounter: Payer: Self-pay | Admitting: Radiation Oncology

## 2013-03-29 NOTE — Progress Notes (Signed)
  Radiation Oncology         [phone] ________________________________  Name: Jason [name] MRN: [id number]  Date: 03/29/2013  DOB: 09/20/1945  Simulation Verification Note - Reduced Field Setup  Status: outpatient  NARRATIVE: The patient was brought to the treatment unit and placed in the planned treatment position. The clinical setup was verified. Then port films were obtained and uploaded to the radiation oncology medical record software.  The treatment beams were carefully compared against the planned radiation fields. The position location and shape of the radiation fields was reviewed. They targeted volume of tissue appears to be appropriately covered by the radiation beams. Organs at risk appear to be excluded as planned.  Based on my personal review, I approved the simulation verification. The patient's treatment will proceed as planned.  -----------------------------------  Blair Promise, PhD, MD

## 2013-03-30 ENCOUNTER — Ambulatory Visit
Admission: RE | Admit: 2013-03-30 | Discharge: 2013-03-30 | Disposition: A | Discharge status: Home / Self Care | Payer: Medicare Other | Source: Ambulatory Visit | Attending: Radiation Oncology | Admitting: Radiation Oncology

## 2013-03-31 ENCOUNTER — Encounter: Payer: Self-pay | Admitting: Radiation Oncology

## 2013-03-31 ENCOUNTER — Ambulatory Visit
Admission: RE | Admit: 2013-03-31 | Discharge: 2013-03-31 | Disposition: A | Discharge status: Home / Self Care | Payer: Medicare Other | Source: Ambulatory Visit | Attending: Radiation Oncology | Admitting: Radiation Oncology

## 2013-03-31 VITALS — BP 108/67 | HR 82 | Temp 98.6°F | Resp 20 | Wt 191.7 lb

## 2013-03-31 DIAGNOSIS — C20 Malignant neoplasm of rectum: Secondary | ICD-10-CM

## 2013-03-31 NOTE — Progress Notes (Signed)
Weekly rad txs 28/28 rectal comlpeted, gave  Month f/u appt card , no c/o pain, diarrhea, no nausea, eating well, good energy,  9:18 AM

## 2013-03-31 NOTE — Progress Notes (Signed)
   Department of Radiation Oncology  Phone:  [phone] Fax:        [phone]  Weekly Treatment Note    Name: Jason [name] Date: 03/31/2013 MRN: [id number] DOB: [birth year]   Current dose: 50.4 Gy  Current fraction: 28   MEDICATIONS: Current Outpatient Prescriptions  Medication Sig Dispense Refill  . allopurinol (ZYLOPRIM) 300 MG tablet Take 1 tablet (300 mg total) by mouth daily.  90 tablet  3  . amLODipine (NORVASC) 10 MG tablet 1 by mouth every morning  90 tablet  3  . atenolol-chlorthalidone (TENORETIC) 50-25 MG per tablet Take 1 tablet by mouth daily.  90 tablet  3  . capecitabine (XELODA) 500 MG tablet Take # 3 (1500 mg) po every am & #2 (1000 mg) po every pm on days of radiation only (Monday-Friday)  140 tablet  0  . doxazosin (CARDURA) 8 MG tablet Take 1 tablet (8 mg total) by mouth at bedtime.  100 tablet  3  . loperamide (IMODIUM A-D) 2 MG tablet Take 2 mg by mouth 4 (four) times daily as needed for diarrhea or loose stools.      . potassium chloride SA (K-DUR,KLOR-CON) 20 MEQ tablet Take 1 tablet (20 mEq total) by mouth daily.  30 tablet  0   No current facility-administered medications for this encounter.     ALLERGIES: Review of patient's allergies indicates no known allergies.   LABORATORY DATA:  Lab Results  Component Value Date   WBC 5.9 03/25/2013   HGB 9.9* 03/25/2013   HCT 30.3* 03/25/2013   MCV 84.6 03/25/2013   PLT 189 03/25/2013   Lab Results  Component Value Date   NA 139 03/17/2013   K 3.2* 03/17/2013   CL 98 01/21/2013   CO2 28 03/17/2013   Lab Results  Component Value Date   ALT 7 03/17/2013   AST 11 03/17/2013   ALKPHOS 78 03/17/2013   BILITOT 0.26 03/17/2013     NARRATIVE: Jason [name] was seen today for weekly treatment management. The chart was checked and the patient's films were reviewed. The patient finished his final fraction today. He has done excellent with the end of his treatment. No diarrhea. No pain. Good energy.  PHYSICAL  EXAMINATION: weight is 191 lb 11.2 oz (86.955 kg). His oral temperature is 98.6 F (37 C). His blood pressure is 108/67 and his pulse is 82. His respiration is 20.        ASSESSMENT: The patient did satisfactorily with treatment.  PLAN: Followup in one month.

## 2013-04-01 ENCOUNTER — Other Ambulatory Visit (HOSPITAL_BASED_OUTPATIENT_CLINIC_OR_DEPARTMENT_OTHER): Payer: Medicare Other

## 2013-04-01 DIAGNOSIS — C20 Malignant neoplasm of rectum: Secondary | ICD-10-CM

## 2013-04-01 LAB — BASIC METABOLIC PANEL (CC13)
BUN: 29.6 mg/dL — ABNORMAL HIGH (ref 7.0–26.0)
Chloride: 105 mEq/L (ref 98–109)
Creatinine: 1.9 mg/dL — ABNORMAL HIGH (ref 0.7–1.3)
Potassium: 3.8 mEq/L (ref 3.5–5.1)

## 2013-04-05 ENCOUNTER — Ambulatory Visit (INDEPENDENT_AMBULATORY_CARE_PROVIDER_SITE_OTHER): Payer: Medicare Other | Admitting: General Surgery

## 2013-04-06 ENCOUNTER — Ambulatory Visit (INDEPENDENT_AMBULATORY_CARE_PROVIDER_SITE_OTHER): Payer: Medicare Other | Admitting: General Surgery

## 2013-04-06 ENCOUNTER — Encounter (INDEPENDENT_AMBULATORY_CARE_PROVIDER_SITE_OTHER): Payer: Self-pay | Admitting: General Surgery

## 2013-04-06 VITALS — BP 114/62 | HR 68 | Temp 97.9°F | Resp 16 | Ht 67.0 in | Wt 188.8 lb

## 2013-04-06 DIAGNOSIS — C20 Malignant neoplasm of rectum: Secondary | ICD-10-CM

## 2013-04-06 MED ORDER — METRONIDAZOLE 500 MG PO TABS: 500.0000 mg | ORAL_TABLET | ORAL | Status: AC

## 2013-04-06 NOTE — Patient Instructions (Signed)
[facility]  ONE-DAY (1) PRE-OP HOME COLON PREP INSTRUCTIONS: ** MIRALAX / GATORADE PREP / FLAGYL**  You must follow the instructions below carefully.  If you have questions or problems, please call and speak to someone in the clinic department at our office:   [phone].     INSTRUCTIONS: 1. Five days prior to your procedure do not eat nuts, popcorn, or fruit with seeds.  Stop all fiber supplements such as Metamucil, Citrucel, etc. 2. Two days before surgery fill the prescription at a pharmacy of your choice and purchase the additional supplies below.         MIRALAX - GATORADE -- DULCOLAX TABS -- FLEET ENEMA:   Purchase a bottle of MIRALAX  (255 gm bottle)    In addition, purchase four (4) DULCOLAX TABLETS (no prescription required- ask the pharmacist if you can't find them)   Purchase one Fleet Enema (Green and white box)    Purchase one 64 oz GATORADE.  (Do NOT purchase red Gatorade; any other flavor is acceptable) and place in refrigerator to get cold.  3.   Day Before Surgery:   6 am: Wash you abdomen with soap and repeat this on the morning of surgery and take 4 Dulcolax tablets   You may only have clear liquids (tea, coffee, juice, broth, jello, soft drinks, gummy bears).  You cannot have solid foods, cream, milk or milk products.  Drink at lease 8 ounces of liquids every hour while awake.   Take the Flagyl prescription as directed at 8 am, 2 pm and 8 pm.  It is helpful to take this with some jello instead of on an empty stomach.  Any flavor is ok, except red jello, which will cause red stools.   Mix the entire bottle of MiraLax and the Gatorade in a large container.    10:00am: Begin drinking the Gatorade mixture until gone (8 oz every 15-30 minutes).      You may suck on a lime wedge or hard candy to "freshen your palate" in between glasses   If you are a diabetic, take your blood sugar reading several time throughout the prep.  Have some juice available to take if your  sugar level gets too low   You may feel chilled while taking the prep.  Have some warm tea or broth to help warm up.   Continue clear liquids until midnight or bedtime  3. The day of your procedure:   Do not eat or drink ANYTHING after midnight before your surgery.     If you take Heart or Blood Pressure medicine, ask the pre-op nurses about these during your preop appointment.   Take a regular Fleet Enema one hour before leaving the come to the hospital.  Try to hold it in 5-10 mins before expelling.   Further pre-operative instructions will be given to you from the hospital.   Expect to be contacted 5-7 days before your surgery.     

## 2013-04-06 NOTE — Progress Notes (Addendum)
Jason [name] is a 67 y.o. male who is here for a follow up visit regarding his rectal cancer.  He tolerated chemoRT rather well and has no complaints.  His tumor was uT3 N2.  It was felt to be ~1cm distal to the levators on previous exam.  He finished his treatment on 9/25.  He denies any chest pain or DOE.  He states he is relatively active around the house.     Objective: Filed Vitals:   04/06/13 1022  BP: 114/62  Pulse: 68  Temp: 97.9 F (36.6 C)  Resp: 16    General appearance: alert and cooperative GI: normal findings: soft, non-tender   Assessment and Plan: Jason [name] is a 67 y.o. M with advanced rectal cancer.  He is s/p ChemoRT and is ready to discuss surgical options.  I believe we can do an LAR with coloanal anastomosis with diverting loop ileostomy.  I am going to order an MRI for 4 weeks from now to evaluate response to treatment and surgical planes. I will get an MRI of his abd as well to evaluate the renal lesion seen on CT.  I would like to schedule for surgery mid Nov.  I will have him see his PCP for medical clearance.  He will see the ostomy RN for preop marking and ostomy education a little closer to his surgery date.    The surgery and anatomy were described to the patient as well as the risks of surgery and the possible complications. These include: Bleeding, infection and possible wound complications such as hernia, damage to adjacent structures, leak of surgical connections, which can lead to other surgeries and possibly an ostomy (5-7%), possible need for other procedures, such as abscess drains in radiology, possible prolonged hospital stay, possible diarrhea from removal of part of the colon, possible constipation from narcotics, prolonged fatigue/weakness or appetite loss, possible early recurrence of cancer, possible complications of their medical problems such as heart disease or arrhythmias or lung problems, death (less than 1%).  I believe the patient  understands and wishes to proceed with the surgery.      Rosario Adie, [facility], [place]

## 2013-04-06 NOTE — Progress Notes (Signed)
  Radiation Oncology         [phone] ________________________________  Name: Jason [name] MRN: [id number]  Date: 03/31/2013  DOB: 1945-08-11  End of Treatment Note  Diagnosis:   Rectal cancer     Indication for treatment:  Curative       Radiation treatment dates:   02/21/2013 through 03/31/2013  Site/dose:   The patient was treated to the pelvis including the gross tumor volume to a dose of 45 gray at 1.8 gray per fraction. This treatment consisted of a 4 field 3-D conformal technique. The patient then received a cone down boost treatment for an additional 5.4 gray. The patient's total dose was 50.4 gray.  Narrative: The patient tolerated radiation treatment relatively well.   The patient did not have any substantial delays during his course of treatment. He had some fairly mild skin irritation.  Plan: The patient has completed radiation treatment. The patient will return to radiation oncology clinic for routine followup in one month. I advised the patient to call or return sooner if they have any questions or concerns related to their recovery or treatment. ________________________________  Jodelle Gross, M.D., Ph.D.

## 2013-04-07 ENCOUNTER — Other Ambulatory Visit (INDEPENDENT_AMBULATORY_CARE_PROVIDER_SITE_OTHER): Payer: Self-pay | Admitting: General Surgery

## 2013-04-07 ENCOUNTER — Other Ambulatory Visit (INDEPENDENT_AMBULATORY_CARE_PROVIDER_SITE_OTHER): Payer: Self-pay

## 2013-04-07 ENCOUNTER — Telehealth (INDEPENDENT_AMBULATORY_CARE_PROVIDER_SITE_OTHER): Payer: Self-pay | Admitting: *Deleted

## 2013-04-07 DIAGNOSIS — C20 Malignant neoplasm of rectum: Secondary | ICD-10-CM

## 2013-04-07 NOTE — Telephone Encounter (Signed)
Spoke with pt to inform him of his appt at [facility] on 05/10/13 with an arrival time of 8:00am for his MRI.  Instructed pt to have NO solid foods 4 hours prior to scan.

## 2013-04-08 ENCOUNTER — Telehealth (INDEPENDENT_AMBULATORY_CARE_PROVIDER_SITE_OTHER): Payer: Self-pay | Admitting: General Surgery

## 2013-04-08 ENCOUNTER — Other Ambulatory Visit: Payer: Medicare Other

## 2013-04-08 NOTE — Telephone Encounter (Signed)
Thank you.  I will let the ostomy nurse know

## 2013-04-08 NOTE — Telephone Encounter (Signed)
Per orders let know when scheduled for surgery for ostomy marking  Sx 05/20/13

## 2013-04-11 ENCOUNTER — Other Ambulatory Visit: Payer: Self-pay | Admitting: Oncology

## 2013-04-13 ENCOUNTER — Encounter (INDEPENDENT_AMBULATORY_CARE_PROVIDER_SITE_OTHER): Payer: Self-pay

## 2013-04-14 ENCOUNTER — Telehealth: Payer: Self-pay | Admitting: *Deleted

## 2013-04-14 NOTE — Telephone Encounter (Signed)
Message from pt asking if he should continue Potassium pills. Yes, will recheck potassium at next visit. He voiced understanding.

## 2013-04-25 ENCOUNTER — Ambulatory Visit (HOSPITAL_COMMUNITY)
Admission: RE | Admit: 2013-04-25 | Discharge: 2013-04-25 | Disposition: A | Discharge status: Home / Self Care | Payer: Medicare Other | Source: Ambulatory Visit | Attending: Nurse Practitioner | Admitting: Nurse Practitioner

## 2013-04-25 ENCOUNTER — Other Ambulatory Visit: Payer: Self-pay | Admitting: Nurse Practitioner

## 2013-04-25 ENCOUNTER — Other Ambulatory Visit (HOSPITAL_BASED_OUTPATIENT_CLINIC_OR_DEPARTMENT_OTHER): Payer: Medicare Other | Admitting: Lab

## 2013-04-25 DIAGNOSIS — N400 Enlarged prostate without lower urinary tract symptoms: Secondary | ICD-10-CM | POA: Insufficient documentation

## 2013-04-25 DIAGNOSIS — C20 Malignant neoplasm of rectum: Secondary | ICD-10-CM | POA: Insufficient documentation

## 2013-04-25 DIAGNOSIS — K429 Umbilical hernia without obstruction or gangrene: Secondary | ICD-10-CM | POA: Insufficient documentation

## 2013-04-25 DIAGNOSIS — R911 Solitary pulmonary nodule: Secondary | ICD-10-CM | POA: Insufficient documentation

## 2013-04-25 DIAGNOSIS — I700 Atherosclerosis of aorta: Secondary | ICD-10-CM | POA: Insufficient documentation

## 2013-04-25 DIAGNOSIS — N281 Cyst of kidney, acquired: Secondary | ICD-10-CM | POA: Insufficient documentation

## 2013-04-25 DIAGNOSIS — N289 Disorder of kidney and ureter, unspecified: Secondary | ICD-10-CM | POA: Insufficient documentation

## 2013-04-25 LAB — COMPREHENSIVE METABOLIC PANEL (CC13)
ALT: 9 U/L (ref 0–55)
AST: 12 U/L (ref 5–34)
Albumin: 3.6 g/dL (ref 3.5–5.0)
Alkaline Phosphatase: 83 U/L (ref 40–150)
Calcium: 10.1 mg/dL (ref 8.4–10.4)
Chloride: 107 mEq/L (ref 98–109)
Creatinine: 1.9 mg/dL — ABNORMAL HIGH (ref 0.7–1.3)
Potassium: 4.1 mEq/L (ref 3.5–5.1)

## 2013-04-25 LAB — CBC WITH DIFFERENTIAL/PLATELET
BASO%: 0.5 % (ref 0.0–2.0)
Basophils Absolute: 0 10*3/uL (ref 0.0–0.1)
EOS%: 1 % (ref 0.0–7.0)
HCT: 32.3 % — ABNORMAL LOW (ref 38.4–49.9)
HGB: 10.4 g/dL — ABNORMAL LOW (ref 13.0–17.1)
LYMPH%: 7.8 % — ABNORMAL LOW (ref 14.0–49.0)
MCH: 27.9 pg (ref 27.2–33.4)
MCV: 86.6 fL (ref 79.3–98.0)
MONO%: 17.5 % — ABNORMAL HIGH (ref 0.0–14.0)
NEUT%: 73.2 % (ref 39.0–75.0)
Platelets: 198 10*3/uL (ref 140–400)

## 2013-04-25 MED ORDER — IOHEXOL 300 MG/ML  SOLN
80.0000 mL | Freq: Once | INTRAMUSCULAR | Status: AC | PRN
  Administered 2013-04-25: 80 mL via INTRAVENOUS

## 2013-04-26 ENCOUNTER — Telehealth: Payer: Self-pay | Admitting: Oncology

## 2013-04-26 ENCOUNTER — Encounter: Payer: Self-pay | Admitting: Radiation Oncology

## 2013-04-26 NOTE — Telephone Encounter (Signed)
s.w. pt and advised on lab b4 MD visit...pt ok and aware

## 2013-04-28 ENCOUNTER — Telehealth: Payer: Self-pay | Admitting: Oncology

## 2013-04-28 ENCOUNTER — Other Ambulatory Visit (HOSPITAL_BASED_OUTPATIENT_CLINIC_OR_DEPARTMENT_OTHER): Payer: Medicare Other | Admitting: Lab

## 2013-04-28 ENCOUNTER — Ambulatory Visit
Admission: RE | Admit: 2013-04-28 | Discharge: 2013-04-28 | Disposition: A | Discharge status: Home / Self Care | Payer: Medicare Other | Source: Ambulatory Visit | Attending: Radiation Oncology | Admitting: Radiation Oncology

## 2013-04-28 ENCOUNTER — Ambulatory Visit (HOSPITAL_BASED_OUTPATIENT_CLINIC_OR_DEPARTMENT_OTHER): Payer: Medicare Other | Admitting: Oncology

## 2013-04-28 ENCOUNTER — Encounter: Payer: Self-pay | Admitting: Radiation Oncology

## 2013-04-28 ENCOUNTER — Other Ambulatory Visit: Payer: Medicare Other | Admitting: Lab

## 2013-04-28 VITALS — BP 147/87 | HR 69 | Temp 98.4°F | Resp 20 | Wt 194.9 lb

## 2013-04-28 VITALS — BP 129/73 | HR 69 | Temp 97.7°F | Resp 19 | Ht 67.0 in | Wt 194.7 lb

## 2013-04-28 DIAGNOSIS — C20 Malignant neoplasm of rectum: Secondary | ICD-10-CM

## 2013-04-28 DIAGNOSIS — K7689 Other specified diseases of liver: Secondary | ICD-10-CM

## 2013-04-28 DIAGNOSIS — D649 Anemia, unspecified: Secondary | ICD-10-CM

## 2013-04-28 DIAGNOSIS — N289 Disorder of kidney and ureter, unspecified: Secondary | ICD-10-CM

## 2013-04-28 HISTORY — DX: Personal history of irradiation: Z92.3

## 2013-04-28 LAB — COMPREHENSIVE METABOLIC PANEL (CC13)
Albumin: 3.5 g/dL (ref 3.5–5.0)
Anion Gap: 12 mEq/L — ABNORMAL HIGH (ref 3–11)
BUN: 18.5 mg/dL (ref 7.0–26.0)
CO2: 25 mEq/L (ref 22–29)
Calcium: 10.1 mg/dL (ref 8.4–10.4)
Chloride: 106 mEq/L (ref 98–109)
Creatinine: 1.8 mg/dL — ABNORMAL HIGH (ref 0.7–1.3)
Glucose: 104 mg/dl (ref 70–140)
Potassium: 3.7 mEq/L (ref 3.5–5.1)

## 2013-04-28 NOTE — Telephone Encounter (Signed)
lvm for pt regarding to [place] appt....mailed pt avs and letter

## 2013-04-28 NOTE — Progress Notes (Signed)
   Jason [name]    OFFICE PROGRESS NOTE   INTERVAL HISTORY:   Jason [name] returns as scheduled. He completed Xeloda and radiation on 03/31/2013. He is scheduled for surgery on 05/20/2013. He feels well.  Objective:  Vital signs in last 24 hours:  Blood pressure 129/73, pulse 69, temperature 97.7 F (36.5 C), temperature source Oral, resp. rate 19, height 5\' 7"  (1.702 m), weight 194 lb 11.2 oz (88.315 kg).    HEENT: No thrush or ulcers Lymphatics: No cervical, supraclavicular, axillary, or inguinal nodes Resp: Lungs clear bilaterally Cardio: Regular rate and rhythm GI: No hepatomegaly Vascular: No leg edema  Skin: Hyperpigmentation over the hands and at the perineum. No skin breakdown     Lab Results:  Lab Results  Component Value Date   WBC 5.2 04/25/2013   HGB 10.4* 04/25/2013   HCT 32.3* 04/25/2013   MCV 86.6 04/25/2013   PLT 198 04/25/2013   ANC 3.8 CEA 4.4  X-rays: CTs of the chest, abdomen, and pelvis on 04/25/2013, compared to a PET/CT 02/17/2013, CT of the chest 02/10/2013, and CT abdomen/pelvis 01/24/2013-no evidence of metastatic disease in the chest, stable irregular upper lobe nodule and bilateral pleural-based nodularity favored to be benign. Decreased size of the rectal mass. Resolution of pelvic lymphadenopathy.    Medications: I have reviewed the patient's current medications.  Assessment/Plan: 1. Rectal cancer, clinical stage III (uT3, uN2).  Initiation of concurrent Xeloda and radiation on 02/21/2013, completed 03/31/2013 2. Markedly elevated pretreat CEA, normal 04/25/2013 3. Indeterminate pulmonary/subpleural nodules and right hepatic dome lesion. Unchanged on the CT 04/25/2013 4. Renal insufficiency. 5. Anemia. Question secondary to renal insufficiency or rectal bleeding. Stable. 6. Hypertension. 7. Gout.   Disposition:  Jason [name] has completed neoadjuvant therapy. The rectal mass is smaller and there is no evidence of  metastatic disease on the restaging CTs. The CEA has normalized. I discussed the CT findings with Jason [name] and his wife. He is scheduled to undergo resection of the rectal tumor on 05/20/2013. He will return on 05/31/2013 to discuss the surgical pathology and plan adjuvant therapy.   Betsy Coder, MD  04/28/2013  4:45 PM

## 2013-04-28 NOTE — Progress Notes (Signed)
Radiation Oncology         [phone] ________________________________  Name: Jason [name] MRN: [id number]  Date: 04/28/2013  DOB: 06-08-1946  Follow-Up Visit Note  CC: Joycelyn Man, MD  Stark Klein, MD  Diagnosis:   Rectal cancer  Interval Since Last Radiation:  One month   Narrative:  The patient returns today for routine follow-up.  The patient states that he has recovered well during his treatment. No skin irritation. No dysuria, nausea, diarrhea. The patient had CT imaging recently and he saw Dr. Benay Spice earlier today to go over these results. He has been scheduled for surgery through Dr. Marcello Moores.                              ALLERGIES:  has No Known Allergies.  Meds: Current Outpatient Prescriptions  Medication Sig Dispense Refill  . allopurinol (ZYLOPRIM) 300 MG tablet Take 1 tablet (300 mg total) by mouth daily.  90 tablet  3  . amLODipine (NORVASC) 10 MG tablet 1 by mouth every morning  90 tablet  3  . atenolol-chlorthalidone (TENORETIC) 50-25 MG per tablet Take 1 tablet by mouth daily.  90 tablet  3  . KLOR-CON M20 20 MEQ tablet TAKE ONE TABLET BY MOUTH ONCE DAILY  30 tablet  1   No current facility-administered medications for this encounter.    Physical Findings: The patient is in no acute distress. Patient is alert and oriented.  weight is 194 lb 14.4 oz (88.406 kg). His oral temperature is 98.4 F (36.9 C). His blood pressure is 147/87 and his pulse is 69. His respiration is 20. .     Lab Findings: Lab Results  Component Value Date   WBC 5.2 04/25/2013   HGB 10.4* 04/25/2013   HCT 32.3* 04/25/2013   MCV 86.6 04/25/2013   PLT 198 04/25/2013     Radiographic Findings: Ct Chest W Contrast  04/25/2013   CLINICAL DATA:  Follow-up rectal cancer  EXAM: CT CHEST, ABDOMEN, AND PELVIS WITH CONTRAST  TECHNIQUE: Multidetector CT imaging of the chest, abdomen and pelvis was performed following the standard protocol during bolus administration of  intravenous contrast.  CONTRAST:  49mL OMNIPAQUE IOHEXOL 300 MG/ML  SOLN  COMPARISON:  PET CT dated 02/17/2013. CT chest dated 02/10/2013. CT abdomen pelvis dated 01/24/2013.  FINDINGS: CT CHEST FINDINGS  Irregular 8 x 10 mm right upper lobe pulmonary nodule (series 4/ image 24) with adjacent minimal nodularity, grossly unchanged.  Pleural based nodularity along the bilateral upper lobes, measuring 13 x 8 mm on the right (series 4/image 18), with possible associated calcification (series 2/image 17). Additional pleural-based nodularity along the right lung base (series 4/image 41) and along the left fissure (series 4/image 31).  No new/suspicious pulmonary nodules. No pleural effusion or pneumothorax.  Visualized thyroid is unremarkable.  The heart is normal in size. No pericardial effusion. Atherosclerotic calcifications of the aortic arch.  No suspicious mediastinal, hilar, or axillary lymphadenopathy.  Degenerative changes of the thoracic spine.  CT ABDOMEN AND PELVIS FINDINGS  8 mm hypoenhancing lesion in the posterior segment right hepatic lobe (series 2/image 52), unchanged, non FDG avid on prior PET.  Spleen, pancreas, and adrenal glands within normal limits.  Gallbladder is unremarkable. No intrahepatic or extrahepatic ductal dilatation.  Bilateral renal cortical scarring. 2.1 x 2.2 cm lesion in the medial interpolar right kidney measuring higher than simple fluid density (35-40 HU), but without de-enhancement on delayed imaging,  favored to reflect a hemorrhagic cyst. Additional 8 mm hyperdense lesion along the lateral right lower pole (series 2/ image 74), too small to characterize. Additional scattered small bilateral renal cysts. No hydronephrosis.  No evidence of bowel obstruction. Normal appendix. Eccentric rectal wall thickening with associated 4.3 x 3.3 cm left rectal mass, previously 6.3 x 4.1 cm, corresponding to known rectal adenocarcinoma. Mass abuts but does not definitely invade the posterior  aspect of the prostate.  No evidence of abdominal aortic aneurysm.  No abdominopelvic ascites.  No suspicious abdominopelvic lymphadenopathy. Prior lymphadenopathy in the sigmoid mesocolon has resolved. Prior prominent left obturator node has resolved. 7 mm short axis right cardiophrenic angle node (series 2/ image 42), grossly unchanged, within normal limits.  Enlargement of the central gland of the prostate, suggesting benign prostatic hyperplasia.  Bladder is thick-walled although underdistended.  Small fat containing periumbilical hernia.  IMPRESSION: No findings specific for metastatic disease in the chest.  Stable 8 x 10 mm irregular right upper lobe pulmonary nodule and bilateral pleural-based nodularity, favored to be benign. Continued attention on follow-up is suggested.  4.3 cm left rectal mass, decreased.  Prior pelvic lymphadenopathy has resolved.  2.2 cm probable hemorrhagic cyst in the medial interpolar right kidney, unchanged. Additional 8 mm hyperdense/complex right lower pole lesion, too small to characterize, unchanged.   Electronically Signed   By: Julian Hy M.D.   On: 04/25/2013 12:15   Ct Abdomen Pelvis W Contrast  04/25/2013   CLINICAL DATA:  Follow-up rectal cancer  EXAM: CT CHEST, ABDOMEN, AND PELVIS WITH CONTRAST  TECHNIQUE: Multidetector CT imaging of the chest, abdomen and pelvis was performed following the standard protocol during bolus administration of intravenous contrast.  CONTRAST:  27mL OMNIPAQUE IOHEXOL 300 MG/ML  SOLN  COMPARISON:  PET CT dated 02/17/2013. CT chest dated 02/10/2013. CT abdomen pelvis dated 01/24/2013.  FINDINGS: CT CHEST FINDINGS  Irregular 8 x 10 mm right upper lobe pulmonary nodule (series 4/ image 24) with adjacent minimal nodularity, grossly unchanged.  Pleural based nodularity along the bilateral upper lobes, measuring 13 x 8 mm on the right (series 4/image 18), with possible associated calcification (series 2/image 17). Additional pleural-based  nodularity along the right lung base (series 4/image 41) and along the left fissure (series 4/image 31).  No new/suspicious pulmonary nodules. No pleural effusion or pneumothorax.  Visualized thyroid is unremarkable.  The heart is normal in size. No pericardial effusion. Atherosclerotic calcifications of the aortic arch.  No suspicious mediastinal, hilar, or axillary lymphadenopathy.  Degenerative changes of the thoracic spine.  CT ABDOMEN AND PELVIS FINDINGS  8 mm hypoenhancing lesion in the posterior segment right hepatic lobe (series 2/image 52), unchanged, non FDG avid on prior PET.  Spleen, pancreas, and adrenal glands within normal limits.  Gallbladder is unremarkable. No intrahepatic or extrahepatic ductal dilatation.  Bilateral renal cortical scarring. 2.1 x 2.2 cm lesion in the medial interpolar right kidney measuring higher than simple fluid density (35-40 HU), but without de-enhancement on delayed imaging, favored to reflect a hemorrhagic cyst. Additional 8 mm hyperdense lesion along the lateral right lower pole (series 2/ image 74), too small to characterize. Additional scattered small bilateral renal cysts. No hydronephrosis.  No evidence of bowel obstruction. Normal appendix. Eccentric rectal wall thickening with associated 4.3 x 3.3 cm left rectal mass, previously 6.3 x 4.1 cm, corresponding to known rectal adenocarcinoma. Mass abuts but does not definitely invade the posterior aspect of the prostate.  No evidence of abdominal aortic aneurysm.  No  abdominopelvic ascites.  No suspicious abdominopelvic lymphadenopathy. Prior lymphadenopathy in the sigmoid mesocolon has resolved. Prior prominent left obturator node has resolved. 7 mm short axis right cardiophrenic angle node (series 2/ image 42), grossly unchanged, within normal limits.  Enlargement of the central gland of the prostate, suggesting benign prostatic hyperplasia.  Bladder is thick-walled although underdistended.  Small fat containing  periumbilical hernia.  IMPRESSION: No findings specific for metastatic disease in the chest.  Stable 8 x 10 mm irregular right upper lobe pulmonary nodule and bilateral pleural-based nodularity, favored to be benign. Continued attention on follow-up is suggested.  4.3 cm left rectal mass, decreased.  Prior pelvic lymphadenopathy has resolved.  2.2 cm probable hemorrhagic cyst in the medial interpolar right kidney, unchanged. Additional 8 mm hyperdense/complex right lower pole lesion, too small to characterize, unchanged.   Electronically Signed   By: Julian Hy M.D.   On: 04/25/2013 12:15    Impression:    The patient is doing satisfactorily one month after his course of radiation treatment. He is scheduled for surgery.  Plan:  Followup in 6 months.   Jodelle Gross, M.D., Ph.D.

## 2013-04-28 NOTE — Progress Notes (Addendum)
Follow up s/p rad tx rectal, no c/o pain, regular bowel movements, eating well, no fatigue, saw Dr.Sherrill this am and was given results of CT 04/25/13, scheduled MR/MRA 05/10/13, scheduled surgery 05/20/13 with Dr. Joyice Faster 10:52 AM

## 2013-05-04 ENCOUNTER — Telehealth: Payer: Self-pay | Admitting: *Deleted

## 2013-05-04 NOTE — Telephone Encounter (Signed)
Patient called to add medication doxazosin 8 mg on his medication list.

## 2013-05-10 ENCOUNTER — Ambulatory Visit
Admission: RE | Admit: 2013-05-10 | Discharge: 2013-05-10 | Disposition: A | Discharge status: Home / Self Care | Payer: Medicare Other | Source: Ambulatory Visit | Attending: General Surgery | Admitting: General Surgery

## 2013-05-10 DIAGNOSIS — C20 Malignant neoplasm of rectum: Secondary | ICD-10-CM

## 2013-05-10 MED ORDER — GADOBENATE DIMEGLUMINE 529 MG/ML IV SOLN
10.0000 mL | Freq: Once | INTRAVENOUS | Status: AC | PRN
  Administered 2013-05-10: 10 mL via INTRAVENOUS

## 2013-05-11 ENCOUNTER — Telehealth (INDEPENDENT_AMBULATORY_CARE_PROVIDER_SITE_OTHER): Payer: Self-pay | Admitting: General Surgery

## 2013-05-11 ENCOUNTER — Encounter (HOSPITAL_COMMUNITY): Payer: Self-pay | Admitting: Pharmacy Technician

## 2013-05-11 NOTE — Telephone Encounter (Signed)
Called patient to let him know per Dr Marcello Moores, that his MRI looked fine. The tumor looks like its shrinking well. The mass in his kidney looks like a benign cyst. And Dr Marcello Moores will proceed with surgery as planned and the patient understood everything I went over with

## 2013-05-16 ENCOUNTER — Encounter (HOSPITAL_COMMUNITY)
Admission: RE | Admit: 2013-05-16 | Discharge: 2013-05-16 | Disposition: A | Discharge status: Home / Self Care | Payer: Medicare Other | Source: Ambulatory Visit | Attending: General Surgery | Admitting: General Surgery

## 2013-05-16 ENCOUNTER — Encounter (HOSPITAL_COMMUNITY): Payer: Self-pay

## 2013-05-16 DIAGNOSIS — C20 Malignant neoplasm of rectum: Secondary | ICD-10-CM | POA: Insufficient documentation

## 2013-05-16 DIAGNOSIS — Z01812 Encounter for preprocedural laboratory examination: Secondary | ICD-10-CM | POA: Insufficient documentation

## 2013-05-16 DIAGNOSIS — Z01818 Encounter for other preprocedural examination: Secondary | ICD-10-CM | POA: Insufficient documentation

## 2013-05-16 LAB — BASIC METABOLIC PANEL
BUN: 32 mg/dL — ABNORMAL HIGH (ref 6–23)
CO2: 26 mEq/L (ref 19–32)
Chloride: 103 mEq/L (ref 96–112)
Creatinine, Ser: 1.86 mg/dL — ABNORMAL HIGH (ref 0.50–1.35)
Glucose, Bld: 95 mg/dL (ref 70–99)
Potassium: 3.8 mEq/L (ref 3.5–5.1)

## 2013-05-16 LAB — CBC
HCT: 33.7 % — ABNORMAL LOW (ref 39.0–52.0)
Hemoglobin: 10.9 g/dL — ABNORMAL LOW (ref 13.0–17.0)
MCH: 27.9 pg (ref 26.0–34.0)
MCV: 86.4 fL (ref 78.0–100.0)
Platelets: 189 10*3/uL (ref 150–400)
WBC: 5.8 10*3/uL (ref 4.0–10.5)

## 2013-05-16 NOTE — Pre-Procedure Instructions (Signed)
CBC, BMET, T/S WERE DONE TODAY - PREOP AT [facility] AS PER ANESTHESIOLOGIST'S GUIDELINES.  CT CHEST 1020/14 AND EKG 07/20/12 ARE IN EPIC.  NOTE OF MEDICAL CLEARANCE ON CHART FROM DR. TODD.  CONSULT DONE TODAY BY OSTOMY NURSE LAURIE MCNICHOL.

## 2013-05-16 NOTE — Consult Note (Signed)
WOC ostomy consult note: Preoperative consultation for stoma site selection Patient and wife seen today for preoperative stoma site selection per Dr. Marcello Moores' request.  Surgery planned for Friday, 11/14.  A right upper quadrant site for a diverting loop ileostomy is marked after assessing patient's abdomen in the sitting and standing positions.  There are no creases or folds to avoid and patient wears his pants and belt above the umbilicus. Site seleted is 7cm to the right of the umbilicus and [id number] above the umbilicus. Education provided: Patient and wife taught basic anatomy and physiology of the GI tract.  Stoma characteristics and pouch characteristics reviewed. A sample pouching system is demonstrated in both the 1-piece and 2-piece versions.  Patient reassured that the pouches are odor proof and waterproof and that showering with the pouching system on is going to be perfectly acceptable. Patient understands role of the [place] [facility] Nurse in the post operative phase and identifies the [facility] as a resource. Patient and wife decline the offer of an educational booklet at this time. We will be happy to follow along with you post surgery. Thanks, Maudie Flakes, MSN, RN, [place], [place], [place] [phone])

## 2013-05-16 NOTE — Patient Instructions (Signed)
YOUR SURGERY IS SCHEDULED AT [facility]  ON:  Friday  11/14  REPORT TO [place] SHORT STAY CENTER AT:  5:30 AM      PHONE # FOR SHORT STAY IS [phone]  FOLLOW YOUR BOWEL PREP INSTRUCTIONS FROM DR. [name]'S OFFICE.  DO NOT EAT OR DRINK ANYTHING AFTER MIDNIGHT THE NIGHT BEFORE YOUR SURGERY.  YOU MAY BRUSH YOUR TEETH, RINSE OUT YOUR MOUTH--BUT NO WATER, NO FOOD, NO CHEWING GUM, NO MINTS, NO CANDIES, NO CHEWING TOBACCO.  PLEASE TAKE THE FOLLOWING MEDICATIONS THE AM OF YOUR SURGERY WITH A FEW SIPS OF WATER:  ALLOPURINOL, AMLODIPINE.    DO NOT BRING VALUABLES, MONEY, CREDIT CARDS.  DO NOT WEAR JEWELRY, MAKE-UP, NAIL POLISH AND NO METAL PINS OR CLIPS IN YOUR HAIR. CONTACT LENS, DENTURES / PARTIALS, GLASSES SHOULD NOT BE WORN TO SURGERY AND IN MOST CASES-HEARING AIDS WILL NEED TO BE REMOVED.  BRING YOUR GLASSES CASE, ANY EQUIPMENT NEEDED FOR YOUR CONTACT LENS. FOR PATIENTS ADMITTED TO THE HOSPITAL--CHECK OUT TIME THE DAY OF DISCHARGE IS 11:00 AM.  ALL INPATIENT ROOMS ARE PRIVATE - WITH BATHROOM, TELEPHONE, TELEVISION AND WIFI INTERNET.                              PLEASE READ OVER ANY  FACT SHEETS THAT YOU WERE GIVEN: BLOOD TRANSFUSION INFORMATION FAILURE TO FOLLOW THESE INSTRUCTIONS MAY RESULT IN THE CANCELLATION OF YOUR SURGERY.   PATIENT SIGNATURE_________________________________

## 2013-05-20 ENCOUNTER — Inpatient Hospital Stay (HOSPITAL_COMMUNITY): Payer: Medicare Other | Admitting: Certified Registered Nurse Anesthetist

## 2013-05-20 ENCOUNTER — Encounter (HOSPITAL_COMMUNITY)
Admission: RE | Disposition: A | Discharge status: Home Health | Payer: Self-pay | Source: Ambulatory Visit | Attending: General Surgery

## 2013-05-20 ENCOUNTER — Inpatient Hospital Stay (HOSPITAL_COMMUNITY)
Admission: RE | Admit: 2013-05-20 | Discharge: 2013-06-28 | DRG: 329 | Disposition: A | Discharge status: Home Health | Payer: Medicare Other | Source: Ambulatory Visit | Attending: General Surgery | Admitting: General Surgery

## 2013-05-20 ENCOUNTER — Encounter (HOSPITAL_COMMUNITY): Payer: Self-pay | Admitting: *Deleted

## 2013-05-20 ENCOUNTER — Encounter (HOSPITAL_COMMUNITY): Payer: Medicare Other | Admitting: Certified Registered Nurse Anesthetist

## 2013-05-20 DIAGNOSIS — R339 Retention of urine, unspecified: Secondary | ICD-10-CM | POA: Diagnosis not present

## 2013-05-20 DIAGNOSIS — M129 Arthropathy, unspecified: Secondary | ICD-10-CM | POA: Diagnosis present

## 2013-05-20 DIAGNOSIS — E46 Unspecified protein-calorie malnutrition: Secondary | ICD-10-CM | POA: Diagnosis present

## 2013-05-20 DIAGNOSIS — M109 Gout, unspecified: Secondary | ICD-10-CM | POA: Diagnosis present

## 2013-05-20 DIAGNOSIS — K929 Disease of digestive system, unspecified: Secondary | ICD-10-CM | POA: Diagnosis not present

## 2013-05-20 DIAGNOSIS — E860 Dehydration: Secondary | ICD-10-CM | POA: Diagnosis not present

## 2013-05-20 DIAGNOSIS — E874 Mixed disorder of acid-base balance: Secondary | ICD-10-CM | POA: Diagnosis not present

## 2013-05-20 DIAGNOSIS — Z9221 Personal history of antineoplastic chemotherapy: Secondary | ICD-10-CM

## 2013-05-20 DIAGNOSIS — Z87891 Personal history of nicotine dependence: Secondary | ICD-10-CM

## 2013-05-20 DIAGNOSIS — N183 Chronic kidney disease, stage 3 unspecified: Secondary | ICD-10-CM | POA: Diagnosis present

## 2013-05-20 DIAGNOSIS — D62 Acute posthemorrhagic anemia: Secondary | ICD-10-CM | POA: Diagnosis not present

## 2013-05-20 DIAGNOSIS — C20 Malignant neoplasm of rectum: Secondary | ICD-10-CM | POA: Diagnosis present

## 2013-05-20 DIAGNOSIS — Z923 Personal history of irradiation: Secondary | ICD-10-CM

## 2013-05-20 DIAGNOSIS — N170 Acute kidney failure with tubular necrosis: Secondary | ICD-10-CM | POA: Diagnosis not present

## 2013-05-20 DIAGNOSIS — K560 Paralytic ileus: Secondary | ICD-10-CM | POA: Diagnosis not present

## 2013-05-20 DIAGNOSIS — C19 Malignant neoplasm of rectosigmoid junction: Secondary | ICD-10-CM

## 2013-05-20 DIAGNOSIS — E876 Hypokalemia: Secondary | ICD-10-CM | POA: Diagnosis not present

## 2013-05-20 DIAGNOSIS — I129 Hypertensive chronic kidney disease with stage 1 through stage 4 chronic kidney disease, or unspecified chronic kidney disease: Secondary | ICD-10-CM | POA: Diagnosis present

## 2013-05-20 DIAGNOSIS — G4733 Obstructive sleep apnea (adult) (pediatric): Secondary | ICD-10-CM | POA: Diagnosis present

## 2013-05-20 DIAGNOSIS — E119 Type 2 diabetes mellitus without complications: Secondary | ICD-10-CM | POA: Diagnosis present

## 2013-05-20 HISTORY — PX: COLON SURGERY: SHX602

## 2013-05-20 HISTORY — PX: LAPAROSCOPIC LOW ANTERIOR RESECTION: SHX5904

## 2013-05-20 HISTORY — PX: OSTOMY: SHX5997

## 2013-05-20 SURGERY — RESECTION, RECTUM, LOW ANTERIOR, LAPAROSCOPIC
Anesthesia: General | Site: Abdomen | Wound class: Contaminated

## 2013-05-20 MED ORDER — DOXAZOSIN MESYLATE 8 MG PO TABS
8.0000 mg | ORAL_TABLET | Freq: Every day | ORAL | Status: DC
  Administered 2013-05-20 – 2013-05-31 (×7): 8 mg via ORAL
  Filled 2013-05-20 (×13): qty 1

## 2013-05-20 MED ORDER — ONDANSETRON HCL 4 MG/2ML IJ SOLN: 4.0000 mg | Freq: Four times a day (QID) | INTRAMUSCULAR | Status: DC | PRN

## 2013-05-20 MED ORDER — CEFOTETAN DISODIUM 2 G IJ SOLR
2.0000 g | Freq: Two times a day (BID) | INTRAMUSCULAR | Status: AC
  Administered 2013-05-21: 02:00:00 2 g via INTRAVENOUS
  Filled 2013-05-20: qty 2

## 2013-05-20 MED ORDER — PROPOFOL 10 MG/ML IV BOLUS
INTRAVENOUS | Status: DC | PRN
  Administered 2013-05-20: 150 mg via INTRAVENOUS

## 2013-05-20 MED ORDER — BUPIVACAINE-EPINEPHRINE 0.25% -1:200000 IJ SOLN
INTRAMUSCULAR | Status: AC
  Filled 2013-05-20: qty 1

## 2013-05-20 MED ORDER — MEPERIDINE HCL 50 MG/ML IJ SOLN: 6.2500 mg | INTRAMUSCULAR | Status: DC | PRN

## 2013-05-20 MED ORDER — DIPHENHYDRAMINE HCL 50 MG/ML IJ SOLN: 12.5000 mg | Freq: Four times a day (QID) | INTRAMUSCULAR | Status: DC | PRN

## 2013-05-20 MED ORDER — NEOSTIGMINE METHYLSULFATE 1 MG/ML IJ SOLN
INTRAMUSCULAR | Status: DC | PRN
  Administered 2013-05-20: 5 mg via INTRAVENOUS

## 2013-05-20 MED ORDER — ALVIMOPAN 12 MG PO CAPS
12.0000 mg | ORAL_CAPSULE | Freq: Once | ORAL | Status: AC
  Administered 2013-05-20: 12 mg via ORAL
  Filled 2013-05-20: qty 1

## 2013-05-20 MED ORDER — HYDROMORPHONE HCL PF 1 MG/ML IJ SOLN
0.2500 mg | INTRAMUSCULAR | Status: DC | PRN
  Administered 2013-05-20 (×4): 0.5 mg via INTRAVENOUS

## 2013-05-20 MED ORDER — AMLODIPINE BESYLATE 10 MG PO TABS
10.0000 mg | ORAL_TABLET | Freq: Every morning | ORAL | Status: DC
  Administered 2013-05-22 – 2013-05-31 (×7): 10 mg via ORAL
  Filled 2013-05-20 (×12): qty 1

## 2013-05-20 MED ORDER — SODIUM CHLORIDE 0.9 % IV SOLN
INTRAVENOUS | Status: DC
  Administered 2013-05-21: 150 mL/h via INTRAVENOUS
  Administered 2013-05-22 – 2013-05-23 (×3): via INTRAVENOUS

## 2013-05-20 MED ORDER — HYDROMORPHONE 0.3 MG/ML IV SOLN
INTRAVENOUS | Status: DC
  Administered 2013-05-20: 3 mg via INTRAVENOUS
  Administered 2013-05-20: 16:00:00 via INTRAVENOUS
  Administered 2013-05-21: 06:00:00 0.5 mg via INTRAVENOUS
  Administered 2013-05-21: 2.4 mg via INTRAVENOUS
  Administered 2013-05-21 (×2): 1.5 mg via INTRAVENOUS
  Filled 2013-05-20: qty 25

## 2013-05-20 MED ORDER — PHENYLEPHRINE HCL 10 MG/ML IJ SOLN
INTRAMUSCULAR | Status: DC | PRN
  Administered 2013-05-20: 80 ug via INTRAVENOUS
  Administered 2013-05-20: 40 ug via INTRAVENOUS
  Administered 2013-05-20 (×2): 80 ug via INTRAVENOUS

## 2013-05-20 MED ORDER — EPHEDRINE SULFATE 50 MG/ML IJ SOLN
INTRAMUSCULAR | Status: DC | PRN
  Administered 2013-05-20 (×2): 10 mg via INTRAVENOUS

## 2013-05-20 MED ORDER — POTASSIUM CHLORIDE CRYS ER 20 MEQ PO TBCR
20.0000 meq | EXTENDED_RELEASE_TABLET | Freq: Every day | ORAL | Status: DC
  Administered 2013-05-20 – 2013-05-22 (×3): 20 meq via ORAL
  Filled 2013-05-20 (×4): qty 1

## 2013-05-20 MED ORDER — HYDROMORPHONE HCL PF 1 MG/ML IJ SOLN
INTRAMUSCULAR | Status: AC
  Filled 2013-05-20: qty 1

## 2013-05-20 MED ORDER — FENTANYL CITRATE 0.05 MG/ML IJ SOLN
INTRAMUSCULAR | Status: DC | PRN
  Administered 2013-05-20 (×10): 50 ug via INTRAVENOUS

## 2013-05-20 MED ORDER — LACTATED RINGERS IV SOLN: INTRAVENOUS | Status: DC

## 2013-05-20 MED ORDER — METOCLOPRAMIDE HCL 5 MG/ML IJ SOLN
INTRAMUSCULAR | Status: DC | PRN
  Administered 2013-05-20: 10 mg via INTRAVENOUS

## 2013-05-20 MED ORDER — ATENOLOL-CHLORTHALIDONE 50-25 MG PO TABS: 1.0000 | ORAL_TABLET | Freq: Every morning | ORAL | Status: DC

## 2013-05-20 MED ORDER — KETAMINE HCL 10 MG/ML IJ SOLN
INTRAMUSCULAR | Status: DC | PRN
  Administered 2013-05-20: 50 mg via INTRAVENOUS

## 2013-05-20 MED ORDER — CEFOTETAN DISODIUM-DEXTROSE 2-2.08 GM-% IV SOLR
INTRAVENOUS | Status: AC
  Filled 2013-05-20: qty 50

## 2013-05-20 MED ORDER — 0.9 % SODIUM CHLORIDE (POUR BTL) OPTIME
TOPICAL | Status: DC | PRN
  Administered 2013-05-20: 3000 mL

## 2013-05-20 MED ORDER — SODIUM CHLORIDE 0.9 % IJ SOLN
INTRAMUSCULAR | Status: AC
  Filled 2013-05-20: qty 3

## 2013-05-20 MED ORDER — GLYCOPYRROLATE 0.2 MG/ML IJ SOLN
INTRAMUSCULAR | Status: DC | PRN
  Administered 2013-05-20: 0.6 mg via INTRAVENOUS

## 2013-05-20 MED ORDER — ALLOPURINOL 300 MG PO TABS
300.0000 mg | ORAL_TABLET | Freq: Every morning | ORAL | Status: DC
  Administered 2013-05-21 – 2013-06-01 (×9): 300 mg via ORAL
  Filled 2013-05-20 (×13): qty 1

## 2013-05-20 MED ORDER — ROCURONIUM BROMIDE 100 MG/10ML IV SOLN
INTRAVENOUS | Status: DC | PRN
  Administered 2013-05-20: 2.5 mg via INTRAVENOUS
  Administered 2013-05-20: 20 mg via INTRAVENOUS
  Administered 2013-05-20 (×2): 10 mg via INTRAVENOUS
  Administered 2013-05-20: 50 mg via INTRAVENOUS
  Administered 2013-05-20 (×2): 5 mg via INTRAVENOUS

## 2013-05-20 MED ORDER — HEPARIN SODIUM (PORCINE) 5000 UNIT/ML IJ SOLN
5000.0000 [IU] | Freq: Three times a day (TID) | INTRAMUSCULAR | Status: DC
  Administered 2013-05-21: 06:00:00 5000 [IU] via SUBCUTANEOUS
  Filled 2013-05-20 (×4): qty 1

## 2013-05-20 MED ORDER — PROMETHAZINE HCL 25 MG/ML IJ SOLN: 6.2500 mg | INTRAMUSCULAR | Status: DC | PRN

## 2013-05-20 MED ORDER — ATENOLOL 50 MG PO TABS
50.0000 mg | ORAL_TABLET | Freq: Every day | ORAL | Status: DC
  Administered 2013-05-22 – 2013-05-31 (×7): 50 mg via ORAL
  Filled 2013-05-20 (×13): qty 1

## 2013-05-20 MED ORDER — LACTATED RINGERS IV SOLN
INTRAVENOUS | Status: DC | PRN
  Administered 2013-05-20: 07:00:00 via INTRAVENOUS

## 2013-05-20 MED ORDER — CHLORTHALIDONE 25 MG PO TABS
25.0000 mg | ORAL_TABLET | Freq: Every day | ORAL | Status: DC
  Administered 2013-05-22 – 2013-06-01 (×8): 25 mg via ORAL
  Filled 2013-05-20 (×12): qty 1

## 2013-05-20 MED ORDER — HYDROMORPHONE 0.3 MG/ML IV SOLN
INTRAVENOUS | Status: AC
  Filled 2013-05-20: qty 25

## 2013-05-20 MED ORDER — SODIUM CHLORIDE 0.9 % IV SOLN
INTRAVENOUS | Status: DC | PRN
  Administered 2013-05-20: 15:00:00 via INTRAVENOUS

## 2013-05-20 MED ORDER — HETASTARCH-ELECTROLYTES 6 % IV SOLN
INTRAVENOUS | Status: DC | PRN
  Administered 2013-05-20: 08:00:00 via INTRAVENOUS

## 2013-05-20 MED ORDER — LACTATED RINGERS IR SOLN
Status: DC | PRN
  Administered 2013-05-20: 3000 mL

## 2013-05-20 MED ORDER — HEPARIN SODIUM (PORCINE) 5000 UNIT/ML IJ SOLN
5000.0000 [IU] | Freq: Once | INTRAMUSCULAR | Status: AC
  Administered 2013-05-20: 5000 [IU] via SUBCUTANEOUS
  Filled 2013-05-20: qty 1

## 2013-05-20 MED ORDER — SUCCINYLCHOLINE CHLORIDE 20 MG/ML IJ SOLN
INTRAMUSCULAR | Status: DC | PRN
  Administered 2013-05-20: 100 mg via INTRAVENOUS

## 2013-05-20 MED ORDER — SODIUM CHLORIDE 0.9 % IJ SOLN: 9.0000 mL | INTRAMUSCULAR | Status: DC | PRN

## 2013-05-20 MED ORDER — MIDAZOLAM HCL 5 MG/5ML IJ SOLN
INTRAMUSCULAR | Status: DC | PRN
  Administered 2013-05-20: 2 mg via INTRAVENOUS

## 2013-05-20 MED ORDER — DEXAMETHASONE SODIUM PHOSPHATE 10 MG/ML IJ SOLN
INTRAMUSCULAR | Status: DC | PRN
  Administered 2013-05-20: 10 mg via INTRAVENOUS

## 2013-05-20 MED ORDER — ATENOLOL 50 MG PO TABS
50.0000 mg | ORAL_TABLET | Freq: Once | ORAL | Status: AC
  Administered 2013-05-20: 50 mg via ORAL
  Filled 2013-05-20 (×2): qty 1

## 2013-05-20 MED ORDER — BUPIVACAINE-EPINEPHRINE 0.25% -1:200000 IJ SOLN
INTRAMUSCULAR | Status: DC | PRN
  Administered 2013-05-20: 20 mL

## 2013-05-20 MED ORDER — CEFOTETAN DISODIUM 2 G IJ SOLR
2.0000 g | INTRAMUSCULAR | Status: AC
  Administered 2013-05-20 (×2): 2 g via INTRAVENOUS
  Filled 2013-05-20: qty 2

## 2013-05-20 MED ORDER — DIPHENHYDRAMINE HCL 12.5 MG/5ML PO ELIX: 12.5000 mg | ORAL_SOLUTION | Freq: Four times a day (QID) | ORAL | Status: DC | PRN

## 2013-05-20 MED ORDER — LIDOCAINE HCL (CARDIAC) 20 MG/ML IV SOLN
INTRAVENOUS | Status: DC | PRN
  Administered 2013-05-20: 100 mg via INTRAVENOUS

## 2013-05-20 MED ORDER — METOPROLOL TARTRATE 1 MG/ML IV SOLN: 5.0000 mg | Freq: Four times a day (QID) | INTRAVENOUS | Status: DC | PRN

## 2013-05-20 MED ORDER — NALOXONE HCL 0.4 MG/ML IJ SOLN: 0.4000 mg | INTRAMUSCULAR | Status: DC | PRN

## 2013-05-20 SURGICAL SUPPLY — 96 items
APPLIER CLIP 5 13 M/L LIGAMAX5 (MISCELLANEOUS)
APR CLP MED LRG 5 ANG JAW (MISCELLANEOUS)
BARRIER SKIN 2 1/4 (WOUND CARE) ×3 IMPLANT
BARRIER SKIN OD1.75 2 1/4 FLNG (WOUND CARE) IMPLANT
BLADE EXTENDED COATED 6.5IN (ELECTRODE) ×4 IMPLANT
BLADE HEX COATED 2.75 (ELECTRODE) ×5 IMPLANT
BLADE SURG SZ10 CARB STEEL (BLADE) ×4 IMPLANT
BRR SKN FLT 1.75X2.25 2 PC (WOUND CARE) ×2
CABLE HIGH FREQUENCY MONO STRZ (ELECTRODE) ×2 IMPLANT
CATH ROBINSON RED A/P 14FR (CATHETERS) ×1 IMPLANT
CELLS DAT CNTRL 66122 CELL SVR (MISCELLANEOUS) IMPLANT
CHLORAPREP W/TINT 26ML (MISCELLANEOUS) ×3 IMPLANT
CLEANER TIP ELECTROSURG 2X2 (MISCELLANEOUS) ×1 IMPLANT
CLIP APPLIE 5 13 M/L LIGAMAX5 (MISCELLANEOUS) IMPLANT
COUNTER NEEDLE 20 DBL MAG RED (NEEDLE) ×3 IMPLANT
COVER MAYO STAND STRL (DRAPES) ×6 IMPLANT
DECANTER SPIKE VIAL GLASS SM (MISCELLANEOUS) ×3 IMPLANT
DRAIN CHANNEL RND F F (WOUND CARE) ×1 IMPLANT
DRAPE LAPAROSCOPIC ABDOMINAL (DRAPES) ×3 IMPLANT
DRAPE LG THREE QUARTER DISP (DRAPES) ×6 IMPLANT
DRAPE UTILITY XL STRL (DRAPES) ×6 IMPLANT
DRAPE WARM FLUID 44X44 (DRAPE) ×3 IMPLANT
DRSG OPSITE POSTOP 4X10 (GAUZE/BANDAGES/DRESSINGS) IMPLANT
DRSG OPSITE POSTOP 4X6 (GAUZE/BANDAGES/DRESSINGS) IMPLANT
DRSG OPSITE POSTOP 4X8 (GAUZE/BANDAGES/DRESSINGS) IMPLANT
DRSG PAD ABDOMINAL 8X10 ST (GAUZE/BANDAGES/DRESSINGS) ×1 IMPLANT
ELECT REM PT RETURN 9FT ADLT (ELECTROSURGICAL) ×3
ELECTRODE REM PT RTRN 9FT ADLT (ELECTROSURGICAL) ×2 IMPLANT
EVACUATOR SILICONE 100CC (DRAIN) ×1 IMPLANT
GLOVE BIO SURGEON STRL SZ 6.5 (GLOVE) ×11 IMPLANT
GLOVE BIOGEL PI IND STRL 6.5 (GLOVE) IMPLANT
GLOVE BIOGEL PI IND STRL 7.0 (GLOVE) ×4 IMPLANT
GLOVE BIOGEL PI IND STRL 8 (GLOVE) IMPLANT
GLOVE BIOGEL PI INDICATOR 6.5 (GLOVE) ×4
GLOVE BIOGEL PI INDICATOR 7.0 (GLOVE) ×11
GLOVE BIOGEL PI INDICATOR 8 (GLOVE) ×3
GLOVE ECLIPSE 8.0 STRL XLNG CF (GLOVE) ×3 IMPLANT
GLOVE SS BIOGEL STRL SZ 7 (GLOVE) IMPLANT
GLOVE SUPERSENSE BIOGEL SZ 7 (GLOVE) ×6
GOWN BRE IMP PREV XXLGXLNG (GOWN DISPOSABLE) ×8 IMPLANT
GOWN STRL NON-REIN LRG LVL3 (GOWN DISPOSABLE) ×4 IMPLANT
GOWN STRL REIN XL XLG (GOWN DISPOSABLE) ×16 IMPLANT
KIT BASIN OR (CUSTOM PROCEDURE TRAY) ×4 IMPLANT
LEGGING LITHOTOMY PAIR STRL (DRAPES) ×3 IMPLANT
LIGASURE IMPACT 36 18CM CVD LR (INSTRUMENTS) IMPLANT
PENCIL BUTTON HOLSTER BLD 10FT (ELECTRODE) ×6 IMPLANT
POUCH OSTOMY 2 PC DRNBL 2.25 (WOUND CARE) IMPLANT
POUCH OSTOMY DRNBL 2 1/4 (WOUND CARE) ×3
RETRACT II ENDO 10MM 32CML (ENDOMECHANICALS) ×3
RETRACTOR II ENDO 10MM 32CML (ENDOMECHANICALS) IMPLANT
RETRACTOR LONE STAR DISPOSABLE (INSTRUMENTS) IMPLANT
RETRACTOR STAY HOOK 5MM (MISCELLANEOUS) IMPLANT
RETRACTOR WND ALEXIS 18 MED (MISCELLANEOUS) IMPLANT
RTRCTR WOUND ALEXIS 18CM MED (MISCELLANEOUS)
SCISSORS LAP 5X35 DISP (ENDOMECHANICALS) ×3 IMPLANT
SEALER TISSUE G2 CVD JAW 35 (ENDOMECHANICALS) IMPLANT
SEALER TISSUE G2 CVD JAW 45CM (ENDOMECHANICALS)
SEALER TISSUE G2 STRG ARTC 35C (ENDOMECHANICALS) ×1 IMPLANT
SET IRRIG TUBING LAPAROSCOPIC (IRRIGATION / IRRIGATOR) ×3 IMPLANT
SLEEVE SURGEON STRL (DRAPES) ×2 IMPLANT
SLEEVE XCEL OPT CAN 5 100 (ENDOMECHANICALS) ×7 IMPLANT
SOLUTION ANTI FOG 6CC (MISCELLANEOUS) ×3 IMPLANT
SPONGE GAUZE 4X4 12PLY (GAUZE/BANDAGES/DRESSINGS) ×3 IMPLANT
SPONGE LAP 18X18 X RAY DECT (DISPOSABLE) ×7 IMPLANT
STAPLER VISISTAT 35W (STAPLE) ×3 IMPLANT
SUCTION POOLE TIP (SUCTIONS) ×3 IMPLANT
SUT ETHILON 2 0 PS N (SUTURE) ×1 IMPLANT
SUT NOVA NAB DX-16 0-1 5-0 T12 (SUTURE) IMPLANT
SUT PDS AB 1 CTX 36 (SUTURE) IMPLANT
SUT PDS AB 1 TP1 96 (SUTURE) IMPLANT
SUT PROLENE 2 0 KS (SUTURE) IMPLANT
SUT SILK 2 0 (SUTURE) ×3
SUT SILK 2 0 SH CR/8 (SUTURE) ×3 IMPLANT
SUT SILK 2-0 18XBRD TIE 12 (SUTURE) ×2 IMPLANT
SUT SILK 3 0 (SUTURE) ×3
SUT SILK 3 0 SH CR/8 (SUTURE) ×3 IMPLANT
SUT SILK 3-0 18XBRD TIE 12 (SUTURE) ×2 IMPLANT
SUT VIC AB 2-0 SH 18 (SUTURE) ×4 IMPLANT
SUT VIC AB 4-0 PS2 27 (SUTURE) ×2 IMPLANT
SUT VICRYL 0 UR6 27IN ABS (SUTURE) ×1 IMPLANT
SUT VICRYL 2 0 18  UND BR (SUTURE)
SUT VICRYL 2 0 18 UND BR (SUTURE) IMPLANT
SYR BULB IRRIGATION 50ML (SYRINGE) ×4 IMPLANT
SYS LAPSCP GELPORT 120MM (MISCELLANEOUS)
SYSTEM LAPSCP GELPORT 120MM (MISCELLANEOUS) IMPLANT
TOWEL OR 17X26 10 PK STRL BLUE (TOWEL DISPOSABLE) ×6 IMPLANT
TOWEL OR NON WOVEN STRL DISP B (DISPOSABLE) ×6 IMPLANT
TRAY FOLEY CATH 14FRSI W/METER (CATHETERS) ×2 IMPLANT
TRAY FOLEY METER SIL LF 16FR (CATHETERS) ×1 IMPLANT
TRAY LAP CHOLE (CUSTOM PROCEDURE TRAY) ×3 IMPLANT
TROCAR BLADELESS OPT 5 100 (ENDOMECHANICALS) ×4 IMPLANT
TROCAR XCEL BLUNT TIP 100MML (ENDOMECHANICALS) ×3 IMPLANT
TROCAR XCEL NON-BLD 11X100MML (ENDOMECHANICALS) ×1 IMPLANT
TUBING CONNECTING 10 (TUBING) ×1 IMPLANT
TUBING FILTER THERMOFLATOR (ELECTROSURGICAL) ×3 IMPLANT
YANKAUER SUCT BULB TIP 10FT TU (MISCELLANEOUS) ×6 IMPLANT

## 2013-05-20 NOTE — Anesthesia Preprocedure Evaluation (Addendum)
Anesthesia Evaluation  Patient identified by MRN, date of birth, ID band Patient awake    Reviewed: Allergy & Precautions, H&P , NPO status , Patient's Chart, lab work & pertinent test results  Airway Mallampati: II TM Distance: >3 FB Neck ROM: Full    Dental no notable dental hx.    Pulmonary sleep apnea , former smoker,  breath sounds clear to auscultation  Pulmonary exam normal       Cardiovascular hypertension, Pt. on medications Rhythm:Regular Rate:Normal     Neuro/Psych negative neurological ROS  negative psych ROS   GI/Hepatic negative GI ROS, Neg liver ROS,   Endo/Other  negative endocrine ROS  Renal/GU Renal InsufficiencyRenal disease  negative genitourinary   Musculoskeletal negative musculoskeletal ROS (+)   Abdominal   Peds negative pediatric ROS (+)  Hematology negative hematology ROS (+)   Anesthesia Other Findings   Reproductive/Obstetrics negative OB ROS                          Anesthesia Physical Anesthesia Plan  ASA: III  Anesthesia Plan: General   Post-op Pain Management:    Induction: Intravenous  Airway Management Planned: Oral ETT  Additional Equipment:   Intra-op Plan:   Post-operative Plan: Extubation in OR  Informed Consent: I have reviewed the patients History and Physical, chart, labs and discussed the procedure including the risks, benefits and alternatives for the proposed anesthesia with the patient or authorized representative who has indicated his/her understanding and acceptance.   Dental advisory given  Plan Discussed with: CRNA  Anesthesia Plan Comments:         Anesthesia Quick Evaluation

## 2013-05-20 NOTE — Preoperative (Signed)
Beta Blockers   Reason not to administer Beta Blockers:Not Applicable Pt took Beta Blocker this AM 05-20-13

## 2013-05-20 NOTE — Transfer of Care (Signed)
Immediate Anesthesia Transfer of Care Note  Patient: Jason [name]  Procedure(s) Performed: Procedure(s): LAPAROSCOPIC LOW ANTERIOR RESECTION WITH SPLENIC FLEXURE MOBILIZATION (N/A) diverting OSTOMY (N/A)  Patient Location: PACU  Anesthesia Type:General  Level of Consciousness: sedated and patient cooperative  Airway & Oxygen Therapy: Patient Spontanous Breathing and Patient connected to face mask oxygen  Post-op Assessment: Report given to PACU RN and Post -op Vital signs reviewed and stable  Post vital signs: stable  Complications: No apparent anesthesia complications

## 2013-05-20 NOTE — Anesthesia Postprocedure Evaluation (Signed)
  Anesthesia Post-op Note  Patient: Jason [name]  Procedure(s) Performed: Procedure(s) (LRB): LAPAROSCOPIC LOW ANTERIOR RESECTION WITH SPLENIC FLEXURE MOBILIZATION (N/A) diverting OSTOMY (N/A)  Patient Location: PACU  Anesthesia Type: General  Level of Consciousness: awake and alert   Airway and Oxygen Therapy: Patient Spontanous Breathing  Post-op Pain: mild  Post-op Assessment: Post-op Vital signs reviewed, Patient's Cardiovascular Status Stable, Respiratory Function Stable, Patent Airway and No signs of Nausea or vomiting  Last Vitals:  Filed Vitals:   05/20/13 1700  BP: 125/74  Pulse: 72  Temp: 36.1 C  Resp: 11    Post-op Vital Signs: stable   Complications: No apparent anesthesia complications

## 2013-05-20 NOTE — H&P (Signed)
Jason [name] is a 67 y.o. M with rectal cancer.  He is s/p neoadjuvant chemoRt.  He appears to have had a good response to his therapy.  He is now ready for his resection.  Past Medical History  Diagnosis Date  . Gout   . Hypertension   . Hyperplastic colon polyp   . Diverticulosis   . Anemia   . Arthritis   . ED (erectile dysfunction)   . Chronic renal insufficiency, stage II (mild)   . rectal ca dx'd 02/01/13    rectal  . History of radiation therapy 02/21/13-03/31/13    rectum 50.4Gy total dose   Past Surgical History  Procedure Laterality Date  . Colonoscopy  2010     GI  . Eus N/A 02/03/2013    Procedure: LOWER ENDOSCOPIC ULTRASOUND (EUS);  Surgeon: Milus Banister, MD;  Location: [facility];  Service: Endoscopy;  Laterality: N/A;   Current facility-administered medications:cefoTEtan (CEFOTAN) 2 g in dextrose 5 % 50 mL IVPB, 2 g, Intravenous, On Call to OR, Leighton Ruff, MD Facility-Administered Medications Ordered in Other Encounters: lactated ringers infusion, , , Continuous PRN, Marta Antu, CRNA;  lactated ringers infusion, , , Continuous PRN, Marta Antu, CRNA  Family History  Problem Relation Age of Onset  . Hypertension Other   . Stroke Mother   . Stroke Father   . Colon cancer Neg Hx    History   Social History  . Marital Status: Married    Spouse Name: [name]    Number of Children: 2  . Years of Education: N/A   Occupational History  .      retired Insurance account manager   Social History Main Topics  . Smoking status: Former Smoker -- 0.50 packs/day for 10 years    Quit date: 07/07/1977  . Smokeless tobacco: Never Used  . Alcohol Use: No     Comment: pint of whisky every 2 weeks and occasional beer until about a month ago   . Drug Use: No  . Sexual Activity: Not on file   Other Topics Concern  . 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  Respiratory: Negative for cough and shortness of breath.   Cardiovascular: Negative for chest pain.  Gastrointestinal: Negative for nausea, vomiting and abdominal pain.  Genitourinary: Negative for dysuria, urgency and frequency.  Musculoskeletal: Negative for myalgias.  Skin: Negative for rash.  Neurological: Negative for dizziness and headaches.   EXAM Filed Vitals:   05/20/13 0533  BP: 130/68  Pulse: 98  Temp: 97.8 F (36.6 C)  Resp: 18   Physical Exam  Constitutional: He is oriented to person, place, and time. He appears well-developed and well-nourished.  HENT:  Head: Normocephalic and atraumatic.  Eyes: EOM are normal. Pupils are equal, round, and reactive to light.  Neck: Normal range of motion.  Cardiovascular: Normal rate and regular rhythm.   Pulmonary/Chest: Effort normal and breath sounds normal. No respiratory distress.  Abdominal: Soft.  Musculoskeletal: Normal range of motion.  Neurological: He is alert and oriented to person, place, and time.   Assessment and Plan:  Jason [name] is a 67 y.o. M with advanced rectal cancer. He is s/p ChemoRT and is ready to discuss surgical options.  I believe we can do an LAR with coloanal anastomosis with diverting loop ileostomy. I am going to order an MRI for 4 weeks from now to evaluate response to treatment and surgical planes. I will get an MRI of his abd as well to evaluate the renal lesion seen on CT. I would like to schedule for surgery mid Nov. I will have him see his PCP for medical clearance. He will see the ostomy RN for preop marking and ostomy education a little closer to his surgery date.  The surgery and anatomy were described to the patient as well as the risks of surgery and the possible complications. These include: Bleeding, infection and possible wound complications such as  hernia, damage to adjacent structures, leak of surgical connections, which can lead to other surgeries and possibly an ostomy (5-7%), possible need for other procedures, such as abscess drains in radiology, possible prolonged hospital stay, possible diarrhea from removal of part of the colon, possible constipation from narcotics, prolonged fatigue/weakness or appetite loss, possible early recurrence of cancer, possible complications of their medical problems such as heart disease or arrhythmias or lung problems, death (less than 1%). I believe the patient understands and wishes to proceed with the surgery.  We discussed in detail that a diverting ileostomy can lead to dehydration that can harm his tenuous kidney function.  He understands this risk and has agreed to proceed.  He also understands that a permanent colostomy would most likely avoid this risk.

## 2013-05-21 LAB — BASIC METABOLIC PANEL
BUN: 36 mg/dL — ABNORMAL HIGH (ref 6–23)
CO2: 22 mEq/L (ref 19–32)
Creatinine, Ser: 2.78 mg/dL — ABNORMAL HIGH (ref 0.50–1.35)
GFR calc Af Amer: 26 mL/min — ABNORMAL LOW (ref >90)
Glucose, Bld: 107 mg/dL — ABNORMAL HIGH (ref 70–99)
Potassium: 5.1 mEq/L (ref 3.5–5.1)
Sodium: 134 mEq/L — ABNORMAL LOW (ref 135–145)

## 2013-05-21 LAB — CBC WITH DIFFERENTIAL/PLATELET
Eosinophils Absolute: 0 10*3/uL (ref 0.0–0.7)
Eosinophils Relative: 0 % (ref 0–5)
HCT: 21.2 % — ABNORMAL LOW (ref 39.0–52.0)
Hemoglobin: 7 g/dL — ABNORMAL LOW (ref 13.0–17.0)
Lymphs Abs: 0.7 10*3/uL (ref 0.7–4.0)
MCH: 28.5 pg (ref 26.0–34.0)
MCV: 86.2 fL (ref 78.0–100.0)
Monocytes Absolute: 1.1 10*3/uL — ABNORMAL HIGH (ref 0.1–1.0)
Monocytes Relative: 6 % (ref 3–12)
Neutrophils Relative %: 90 % — ABNORMAL HIGH (ref 43–77)
RBC: 2.46 MIL/uL — ABNORMAL LOW (ref 4.22–5.81)

## 2013-05-21 LAB — CBC
HCT: 21.6 % — ABNORMAL LOW (ref 39.0–52.0)
Hemoglobin: 7.1 g/dL — ABNORMAL LOW (ref 13.0–17.0)
MCH: 28.2 pg (ref 26.0–34.0)
MCHC: 32.9 g/dL (ref 30.0–36.0)
MCV: 85.7 fL (ref 78.0–100.0)
RBC: 2.52 MIL/uL — ABNORMAL LOW (ref 4.22–5.81)

## 2013-05-21 MED ORDER — SODIUM CHLORIDE 0.9 % IV BOLUS (SEPSIS)
500.0000 mL | Freq: Once | INTRAVENOUS | Status: AC
  Administered 2013-05-21: 13:00:00 500 mL via INTRAVENOUS

## 2013-05-21 MED ORDER — HYDROMORPHONE HCL PF 1 MG/ML IJ SOLN
1.0000 mg | INTRAMUSCULAR | Status: DC | PRN
  Administered 2013-05-21: 1 mg via INTRAVENOUS
  Administered 2013-05-21: 2 mg via INTRAVENOUS
  Administered 2013-05-21 – 2013-05-22 (×3): 1 mg via INTRAVENOUS
  Administered 2013-05-22: 2 mg via INTRAVENOUS
  Administered 2013-05-22 (×4): 1 mg via INTRAVENOUS
  Administered 2013-05-23 (×4): 2 mg via INTRAVENOUS
  Administered 2013-05-23: 1 mg via INTRAVENOUS
  Administered 2013-05-23 – 2013-05-26 (×10): 2 mg via INTRAVENOUS
  Administered 2013-05-26: 1 mg via INTRAVENOUS
  Administered 2013-05-26 – 2013-06-01 (×30): 2 mg via INTRAVENOUS
  Administered 2013-06-01: 1 mg via INTRAVENOUS
  Administered 2013-06-01: 2 mg via INTRAVENOUS
  Administered 2013-06-02 (×4): 1 mg via INTRAVENOUS
  Administered 2013-06-02 (×2): 2 mg via INTRAVENOUS
  Administered 2013-06-03: 1 mg via INTRAVENOUS
  Administered 2013-06-03 (×2): 2 mg via INTRAVENOUS
  Administered 2013-06-03: 1 mg via INTRAVENOUS
  Administered 2013-06-03: 2 mg via INTRAVENOUS
  Administered 2013-06-03 – 2013-06-04 (×2): 1 mg via INTRAVENOUS
  Administered 2013-06-04: 2 mg via INTRAVENOUS
  Administered 2013-06-04: 1 mg via INTRAVENOUS
  Administered 2013-06-04: 2 mg via INTRAVENOUS
  Administered 2013-06-04 – 2013-06-05 (×3): 1 mg via INTRAVENOUS
  Administered 2013-06-05: 2 mg via INTRAVENOUS
  Administered 2013-06-05: 1 mg via INTRAVENOUS
  Administered 2013-06-05 – 2013-06-06 (×3): 2 mg via INTRAVENOUS
  Administered 2013-06-06: 1 mg via INTRAVENOUS
  Administered 2013-06-07: 2 mg via INTRAVENOUS
  Administered 2013-06-07: 1 mg via INTRAVENOUS
  Administered 2013-06-07: 2 mg via INTRAVENOUS
  Administered 2013-06-07: 1 mg via INTRAVENOUS
  Administered 2013-06-07 (×2): 2 mg via INTRAVENOUS
  Administered 2013-06-08: 1 mg via INTRAVENOUS
  Administered 2013-06-08: 2 mg via INTRAVENOUS
  Administered 2013-06-08 (×2): 1 mg via INTRAVENOUS
  Administered 2013-06-08 (×2): 2 mg via INTRAVENOUS
  Administered 2013-06-08: 1 mg via INTRAVENOUS
  Administered 2013-06-08: 2 mg via INTRAVENOUS
  Administered 2013-06-09 – 2013-06-12 (×23): 1 mg via INTRAVENOUS
  Administered 2013-06-12: 2 mg via INTRAVENOUS
  Administered 2013-06-12 (×3): 1 mg via INTRAVENOUS
  Administered 2013-06-12: 2 mg via INTRAVENOUS
  Administered 2013-06-12 – 2013-06-13 (×7): 1 mg via INTRAVENOUS
  Administered 2013-06-13: 2 mg via INTRAVENOUS
  Administered 2013-06-14 (×3): 1 mg via INTRAVENOUS
  Administered 2013-06-14: 2 mg via INTRAVENOUS
  Administered 2013-06-14 (×3): 1 mg via INTRAVENOUS
  Administered 2013-06-14 – 2013-06-15 (×2): 2 mg via INTRAVENOUS
  Administered 2013-06-15 (×2): 1 mg via INTRAVENOUS
  Administered 2013-06-15: 2 mg via INTRAVENOUS
  Administered 2013-06-15 – 2013-06-17 (×10): 1 mg via INTRAVENOUS
  Administered 2013-06-17: 2 mg via INTRAVENOUS
  Administered 2013-06-17 (×4): 1 mg via INTRAVENOUS
  Administered 2013-06-18: 2 mg via INTRAVENOUS
  Administered 2013-06-18: 1 mg via INTRAVENOUS
  Administered 2013-06-18: 2 mg via INTRAVENOUS
  Administered 2013-06-18 (×2): 1 mg via INTRAVENOUS
  Administered 2013-06-19: 2 mg via INTRAVENOUS
  Administered 2013-06-19: 1 mg via INTRAVENOUS
  Administered 2013-06-19: 2 mg via INTRAVENOUS
  Administered 2013-06-19: 1 mg via INTRAVENOUS
  Administered 2013-06-20 – 2013-06-21 (×6): 2 mg via INTRAVENOUS
  Administered 2013-06-21 – 2013-06-22 (×3): 1 mg via INTRAVENOUS
  Administered 2013-06-22: 2 mg via INTRAVENOUS
  Filled 2013-05-21: qty 1
  Filled 2013-05-21 (×2): qty 2
  Filled 2013-05-21 (×3): qty 1
  Filled 2013-05-21: qty 2
  Filled 2013-05-21: qty 1
  Filled 2013-05-21 (×5): qty 2
  Filled 2013-05-21: qty 1
  Filled 2013-05-21: qty 2
  Filled 2013-05-21: qty 1
  Filled 2013-05-21: qty 2
  Filled 2013-05-21: qty 1
  Filled 2013-05-21: qty 2
  Filled 2013-05-21 (×2): qty 1
  Filled 2013-05-21: qty 2
  Filled 2013-05-21 (×4): qty 1
  Filled 2013-05-21: qty 2
  Filled 2013-05-21 (×2): qty 1
  Filled 2013-05-21 (×4): qty 2
  Filled 2013-05-21: qty 1
  Filled 2013-05-21 (×2): qty 2
  Filled 2013-05-21 (×3): qty 1
  Filled 2013-05-21 (×2): qty 2
  Filled 2013-05-21: qty 1
  Filled 2013-05-21: qty 2
  Filled 2013-05-21: qty 1
  Filled 2013-05-21 (×3): qty 2
  Filled 2013-05-21: qty 1
  Filled 2013-05-21 (×2): qty 2
  Filled 2013-05-21 (×3): qty 1
  Filled 2013-05-21 (×3): qty 2
  Filled 2013-05-21: qty 1
  Filled 2013-05-21 (×2): qty 2
  Filled 2013-05-21: qty 1
  Filled 2013-05-21: qty 2
  Filled 2013-05-21: qty 1
  Filled 2013-05-21 (×4): qty 2
  Filled 2013-05-21 (×3): qty 1
  Filled 2013-05-21: qty 2
  Filled 2013-05-21: qty 1
  Filled 2013-05-21: qty 2
  Filled 2013-05-21: qty 1
  Filled 2013-05-21 (×7): qty 2
  Filled 2013-05-21 (×2): qty 1
  Filled 2013-05-21: qty 2
  Filled 2013-05-21: qty 1
  Filled 2013-05-21 (×2): qty 2
  Filled 2013-05-21 (×3): qty 1
  Filled 2013-05-21 (×3): qty 2
  Filled 2013-05-21 (×4): qty 1
  Filled 2013-05-21 (×2): qty 2
  Filled 2013-05-21 (×2): qty 1
  Filled 2013-05-21 (×2): qty 2
  Filled 2013-05-21: qty 1
  Filled 2013-05-21 (×2): qty 2
  Filled 2013-05-21 (×2): qty 1
  Filled 2013-05-21: qty 2
  Filled 2013-05-21 (×3): qty 1
  Filled 2013-05-21: qty 2
  Filled 2013-05-21 (×4): qty 1
  Filled 2013-05-21 (×2): qty 2
  Filled 2013-05-21 (×2): qty 1
  Filled 2013-05-21: qty 2
  Filled 2013-05-21: qty 1
  Filled 2013-05-21: qty 2
  Filled 2013-05-21 (×2): qty 1
  Filled 2013-05-21 (×4): qty 2
  Filled 2013-05-21 (×2): qty 1
  Filled 2013-05-21: qty 2
  Filled 2013-05-21 (×2): qty 1
  Filled 2013-05-21 (×2): qty 2
  Filled 2013-05-21 (×6): qty 1
  Filled 2013-05-21 (×2): qty 2
  Filled 2013-05-21: qty 1
  Filled 2013-05-21: qty 2
  Filled 2013-05-21 (×2): qty 1
  Filled 2013-05-21 (×4): qty 2
  Filled 2013-05-21: qty 1
  Filled 2013-05-21: qty 2
  Filled 2013-05-21: qty 1
  Filled 2013-05-21: qty 2
  Filled 2013-05-21: qty 1
  Filled 2013-05-21 (×3): qty 2
  Filled 2013-05-21: qty 1
  Filled 2013-05-21 (×3): qty 2
  Filled 2013-05-21 (×2): qty 1
  Filled 2013-05-21: qty 2
  Filled 2013-05-21 (×2): qty 1
  Filled 2013-05-21 (×2): qty 2
  Filled 2013-05-21: qty 1
  Filled 2013-05-21 (×2): qty 2
  Filled 2013-05-21 (×2): qty 1
  Filled 2013-05-21 (×2): qty 2
  Filled 2013-05-21 (×2): qty 1

## 2013-05-21 MED ORDER — LIP MEDEX EX OINT
TOPICAL_OINTMENT | CUTANEOUS | Status: DC | PRN
  Filled 2013-05-21 (×2): qty 7

## 2013-05-21 NOTE — Progress Notes (Signed)
Hgb 7.0  On 3pm lab-down from 7.1 on am labs-Dr Lucia Gaskins request to call if HGB <7.0

## 2013-05-21 NOTE — Progress Notes (Signed)
General Surgery Note  LOS: 1 day  POD -  1 Day Post-Op  Assessment/Plan: 1.  LAPAROSCOPIC LOW ANTERIOR RESECTION WITH SPLENIC FLEXURE MOBILIZATION, diverting OSTOMY - 05/11/2103 - A. [name]  On ice chips only.  Draining some clear fluid from rectum.  2.  Acute blood loss anemia  Will recheck Hgb this PM. 3.  DVT prophylaxis - SQ Heparin  Will hold for now. 4.  Renal function - Bumped creatinine to 2.78 (pre op 1.86)  Has foley  Will leave because of anemia and increasing creatinine 5.  Hypertension  Meds on hold for now 6.  History of gout  Subjective:  He feels okay.  No specific complaint. Objective:   Filed Vitals:   05/21/13 0519  BP: 100/54  Pulse: 84  Temp: 98.4 F (36.9 C)  Resp: 12     Intake/Output from previous day:  11/14 0701 - 11/15 0700 In: 6650 [I.V.:6100; IV Piggyback:550] Out: 1200 [Urine:310; Drains:440; Blood:450]  Intake/Output this shift:      Physical Exam:   General: WN AA M who is alert and oriented.    HEENT: Normal. Pupils equal. .   Lungs: Clear.   Abdomen: Soft   Wound: Ostomy in RLQ,  Drain in LLQ - 440 cc recorded yesterday.   Lab Results:    Recent Labs  05/21/13 0530  WBC 16.4*  HGB 7.1*  HCT 21.6*  PLT 190    BMET   Recent Labs  05/21/13 0530  NA 134*  K 5.1  CL 102  CO2 22  GLUCOSE 107*  BUN 36*  CREATININE 2.78*  CALCIUM 8.5    PT/INR  No results found for this basename: LABPROT, INR,  in the last 72 hours  ABG  No results found for this basename: PHART, PCO2, PO2, HCO3,  in the last 72 hours   Studies/Results:  No results found.   Anti-infectives:   Anti-infectives   Start     Dose/Rate Route Frequency Ordered Stop   05/21/13 0200  cefoTEtan (CEFOTAN) 2 g in dextrose 5 % 50 mL IVPB     2 g 100 mL/hr over 30 Minutes Intravenous Every 12 hours 05/20/13 1808 05/21/13 0203   05/20/13 0551  cefoTEtan (CEFOTAN) 2 g in dextrose 5 % 50 mL IVPB     2 g 100 mL/hr over 30 Minutes Intravenous On call to  O.R. 05/20/13 0551 05/20/13 1345      Alphonsa Overall, MD, FACS Pager: [facility] Office: [phone] 05/21/2013

## 2013-05-21 NOTE — Progress Notes (Signed)
PT Cancellation Note  Patient Details Name: Jason [name] MRN: [id number] DOB: 11-14-1945   Cancelled Treatment:    Reason Eval/Treat Not Completed: Medical issues which prohibited therapy (low HGB, Up to recliner  by RN, dizzy)   [name] 05/21/2013, 10:21 AM Tresa Endo PT [phone]

## 2013-05-21 NOTE — Progress Notes (Signed)
OT Cancellation Note  Patient Details Name: Jason [name] MRN: [id number] DOB: [birth year]   Cancelled Treatment:    Reason Eval/Treat Not Completed: Medical issues which prohibited therapy (low hemoglobin, dizzy)  Shauni Henner A 05/21/2013, 10:31 AM

## 2013-05-22 LAB — CBC
HCT: 21.7 % — ABNORMAL LOW (ref 39.0–52.0)
Hemoglobin: 7.5 g/dL — ABNORMAL LOW (ref 13.0–17.0)
MCH: 29.8 pg (ref 26.0–34.0)
MCHC: 34.6 g/dL (ref 30.0–36.0)
Platelets: 141 10*3/uL — ABNORMAL LOW (ref 150–400)

## 2013-05-22 LAB — BASIC METABOLIC PANEL
BUN: 43 mg/dL — ABNORMAL HIGH (ref 6–23)
Calcium: 8.5 mg/dL (ref 8.4–10.5)
Chloride: 106 mEq/L (ref 96–112)
GFR calc non Af Amer: 25 mL/min — ABNORMAL LOW (ref >90)
Glucose, Bld: 84 mg/dL (ref 70–99)
Potassium: 4.3 mEq/L (ref 3.5–5.1)

## 2013-05-22 LAB — TYPE AND SCREEN: Unit division: 0

## 2013-05-22 LAB — MAGNESIUM: Magnesium: 1.3 mg/dL — ABNORMAL LOW (ref 1.5–2.5)

## 2013-05-22 MED ORDER — SODIUM CHLORIDE 0.9 % IV SOLN: 3.0000 g | Freq: Once | INTRAVENOUS | Status: DC

## 2013-05-22 MED ORDER — ONDANSETRON HCL 4 MG/2ML IJ SOLN
4.0000 mg | Freq: Three times a day (TID) | INTRAMUSCULAR | Status: DC | PRN
  Administered 2013-05-22 – 2013-05-24 (×3): 4 mg via INTRAVENOUS
  Filled 2013-05-22 (×4): qty 2

## 2013-05-22 MED ORDER — MAGNESIUM CHLORIDE 64 MG PO TBEC
1.0000 | DELAYED_RELEASE_TABLET | Freq: Once | ORAL | Status: AC
  Administered 2013-05-22: 64 mg via ORAL
  Filled 2013-05-22: qty 1

## 2013-05-22 MED ORDER — HEPARIN SODIUM (PORCINE) 5000 UNIT/ML IJ SOLN
5000.0000 [IU] | Freq: Three times a day (TID) | INTRAMUSCULAR | Status: DC
  Administered 2013-05-22 – 2013-06-28 (×109): 5000 [IU] via SUBCUTANEOUS
  Filled 2013-05-22 (×117): qty 1

## 2013-05-22 NOTE — Progress Notes (Signed)
Pt c/o nausea after ambulating. No antiemetics ordered. Dr Lilyan Punt present and made aware. Orders received for zofran and given. Will monitor.

## 2013-05-22 NOTE — Evaluation (Signed)
Physical Therapy Evaluation Patient Details Name: Jason [name] MRN: [id number] DOB: 08-Jun-1946 Today's Date: 05/22/2013 Time: [id number] PT Time Calculation (min): 20 min  PT Assessment / Plan / Recommendation History of Present Illness  Patient is a 67 y/o male admitted for colectomy with diverting colostomy due to rectal cancer.  Clinical Impression  Patient presents with decreased mobility due to deficits listed below.  He will benefit from skilled PT in the acute setting to maximize independence and allow return home with wife assist and HHPT.    PT Assessment  Patient needs continued PT services    Follow Up Recommendations  Home health PT    Does the patient have the potential to tolerate intense rehabilitation    N/A  Barriers to Discharge  None      Equipment Recommendations  Other (comment) (TBA, pt to ask family)    Recommendations for Other Services   None  Frequency Min 3X/week    Precautions / Restrictions Precautions Precautions: Fall Restrictions Weight Bearing Restrictions: No   Pertinent Vitals/Pain 5/10 prior to tx 6-7/10 after ambulation (RN aware)      Mobility  Bed Mobility Bed Mobility: Not assessed Details for Bed Mobility Assistance: pt up in chair Transfers Transfers: Sit to Stand;Stand to Sit Sit to Stand: 4: Min assist;From chair/3-in-1;With armrests Stand to Sit: 4: Min assist;With armrests;To chair/3-in-1 Details for Transfer Assistance: Heavy reliance on UE assist, needed lifting help out of recliner Ambulation/Gait Ambulation/Gait Assistance: 4: Min guard Ambulation Distance (Feet): 150 Feet Assistive device: Rolling walker Ambulation/Gait Assistance Details: increased assist on turns due to core weakness and pain difficulty lifting walker Gait Pattern: Step-through pattern;Decreased stride length        PT Diagnosis: Generalized weakness;Acute pain  PT Problem List: Decreased strength;Decreased activity tolerance;Decreased  mobility;Decreased knowledge of use of DME;Decreased balance PT Treatment Interventions: DME instruction;Gait training;Balance training;Patient/family education;Functional mobility training;Therapeutic activities;Therapeutic exercise     PT Goals(Current goals can be found in the care plan section) Acute Rehab PT Goals Patient Stated Goal: To go home PT Goal Formulation: With patient Time For Goal Achievement: 06/05/13 Potential to Achieve Goals: Good  Visit Information  Last PT Received On: 05/22/13 Assistance Needed: +1 History of Present Illness: Patient is a 67 y/o male admitted for colectomy with diverting colostomy due to rectal cancer.       Prior [place] expects to be discharged to:: Private residence Living Arrangements: Spouse/significant other Available Help at Discharge: Family Type of Home: House Home Access: Stairs to enter Technical brewer of Steps: 1 Entrance Stairs-Rails: None Home Layout: One level Home Equipment: None Additional Comments: states thinks sister in law has couple of walkers, plans to check with wife on availability prior to getting one. Prior Function Level of Independence: Independent Communication Communication: No difficulties    Cognition  Cognition Arousal/Alertness: Awake/alert Behavior During Therapy: WFL for tasks assessed/performed Overall Cognitive Status: Within Functional Limits for tasks assessed    Extremity/Trunk Assessment Upper Extremity Assessment Upper Extremity Assessment: Overall WFL for tasks assessed Lower Extremity Assessment Lower Extremity Assessment: RLE deficits/detail;LLE deficits/detail RLE Deficits / Details: AAROM WFL, knee extension strength 4+/5, hip flexion strength NT due to abdominal pain LLE Deficits / Details: AAROM WFL, knee extension strength 4+/5, hip flexion strength NT due to abdominal pain   Balance Balance Balance Assessed: Yes Static Standing  Balance Static Standing - Balance Support: Bilateral upper extremity supported Static Standing - Level of Assistance: 5: Stand by assistance  End of  Session PT - End of Session Equipment Utilized During Treatment: Gait belt;Oxygen Activity Tolerance: Patient tolerated treatment well Patient left: in chair;with call bell/phone within reach Nurse Communication: Patient requests pain meds  GP     [facility] 05/22/2013, 12:04 PM Magda Kiel, [place] 05/22/2013

## 2013-05-22 NOTE — Progress Notes (Signed)
General Surgery Note  LOS: 2 days  POD -  2 Days Post-Op  Assessment/Plan: 1.  LAPAROSCOPIC LOW ANTERIOR RESECTION WITH SPLENIC FLEXURE MOBILIZATION (colo-anal anastomosis), diverting OSTOMY - 05/11/2103 - Jason [name]  Draining some clear fluid from rectum ,but less than yesterday.  To give sips of liquids from floor  2.  Acute blood loss anemia  Transfused one unt - 05/21/2013  Hgb - 7.5 - 05/22/2013 3.  DVT prophylaxis - SQ Heparin  Held 05/21/2013 because of anemia, will restart 4.  Renal function - Better creatinine - 2.56 - 05/23/2103 (pre op 1.86) - urine out put has picked up significantly - 1,500 cc recorded yesterday  Will try foley out. 5.  Hypertension 6.  History of gout  Subjective:  He feels okay.  I discussed trying to ambulate a little more.  Looks better than yesterday. Objective:   Filed Vitals:   05/22/13 0631  BP: 120/58  Pulse: 104  Temp: 99.7 F (37.6 C)  Resp: 18     Intake/Output from previous day:  11/15 0701 - 11/16 0700 In: 697 [Blood:637] Out: 1850 [Urine:1500; Drains:350]  Intake/Output this shift:      Physical Exam:   General: WN AA M who is alert and oriented.    HEENT: Normal. Pupils equal. .   Lungs: Clear.  IS - 1,500 cc.   Abdomen: Soft.  Has a few BS.   Wound: Ostomy in RLQ,  Drain in LLQ - 350 cc recorded last 24 hours.   Lab Results:     Recent Labs  05/21/13 1516 05/22/13 0532  WBC 17.4* 12.5*  HGB 7.0* 7.5*  HCT 21.2* 21.7*  PLT 170 141*    BMET    Recent Labs  05/21/13 0530 05/22/13 0532  NA 134* 136  K 5.1 4.3  CL 102 106  CO2 22 21  GLUCOSE 107* 84  BUN 36* 43*  CREATININE 2.78* 2.56*  CALCIUM 8.5 8.5    PT/INR  No results found for this basename: LABPROT, INR,  in the last 72 hours  ABG  No results found for this basename: PHART, PCO2, PO2, HCO3,  in the last 72 hours   Studies/Results:  No results found.   Anti-infectives:   Anti-infectives   Start     Dose/Rate Route Frequency Ordered Stop    05/21/13 0200  cefoTEtan (CEFOTAN) 2 g in dextrose 5 % 50 mL IVPB     2 g 100 mL/hr over 30 Minutes Intravenous Every 12 hours 05/20/13 1808 05/21/13 0203   05/20/13 0551  cefoTEtan (CEFOTAN) 2 g in dextrose 5 % 50 mL IVPB     2 g 100 mL/hr over 30 Minutes Intravenous On call to O.R. 05/20/13 0551 05/20/13 1345      Alphonsa Overall, MD, FACS Pager: [facility] Office: [phone] 05/22/2013

## 2013-05-22 NOTE — Progress Notes (Addendum)
Pt has had frequent PVCs today with occasional bigeminy. Mg was 1.3 and has been replaced orally. Pt has not voided since foley d/c at 1015 and states no urge to void. Bladder scan shows >211ml in bladder.  Dr Lilyan Punt made aware and state to continue to monitor telemetry and to straight cath pt and leave foley in place if output >366ml.

## 2013-05-23 ENCOUNTER — Encounter (HOSPITAL_COMMUNITY): Payer: Self-pay | Admitting: General Surgery

## 2013-05-23 LAB — BASIC METABOLIC PANEL
BUN: 36 mg/dL — ABNORMAL HIGH (ref 6–23)
Chloride: 109 mEq/L (ref 96–112)
GFR calc non Af Amer: 31 mL/min — ABNORMAL LOW (ref >90)
Glucose, Bld: 95 mg/dL (ref 70–99)
Potassium: 4.2 mEq/L (ref 3.5–5.1)

## 2013-05-23 LAB — CBC
HCT: 22.5 % — ABNORMAL LOW (ref 39.0–52.0)
Hemoglobin: 7.5 g/dL — ABNORMAL LOW (ref 13.0–17.0)
MCH: 28.8 pg (ref 26.0–34.0)
MCHC: 33.3 g/dL (ref 30.0–36.0)
MCV: 86.5 fL (ref 78.0–100.0)
RDW: 17.2 % — ABNORMAL HIGH (ref 11.5–15.5)

## 2013-05-23 MED ORDER — KCL IN DEXTROSE-NACL 20-5-0.45 MEQ/L-%-% IV SOLN
INTRAVENOUS | Status: AC
  Administered 2013-05-23 – 2013-05-26 (×5): via INTRAVENOUS
  Filled 2013-05-23 (×9): qty 1000

## 2013-05-23 MED ORDER — MAGNESIUM SULFATE 40 MG/ML IJ SOLN
4.0000 g | Freq: Once | INTRAMUSCULAR | Status: AC
  Administered 2013-05-23: 4 g via INTRAVENOUS
  Filled 2013-05-23: qty 100

## 2013-05-23 MED ORDER — METOPROLOL TARTRATE 1 MG/ML IV SOLN
5.0000 mg | Freq: Four times a day (QID) | INTRAVENOUS | Status: DC | PRN
  Administered 2013-05-23: 12:00:00 5 mg via INTRAVENOUS
  Filled 2013-05-23: qty 5

## 2013-05-23 NOTE — Progress Notes (Signed)
Occupational Therapy Evaluation Patient Details Name: Jason [name] MRN: [id number] DOB: May 02, 1946 Today's Date: 05/23/2013 Time: [id number] OT Time Calculation (min): 14 min  OT Assessment / Plan / Recommendation History of present illness Patient is a 67 y/o male admitted for colectomy with diverting colostomy due to rectal cancer.   Clinical Impression   Patient doing well, ambulating with RW. Wife can A with ADLs as needed at discharge.    OT Assessment  Patient needs continued OT Services    Follow Up Recommendations  No OT follow up;Supervision/Assistance - 24 hour    Barriers to Discharge      Equipment Recommendations  Tub/shower seat    Recommendations for Other Services    Frequency  Min 2X/week    Precautions / Restrictions Precautions Precautions: Fall Restrictions Weight Bearing Restrictions: No   Pertinent Vitals/Pain No c/o pain    ADL  Eating/Feeding: Independent Where Assessed - Eating/Feeding: Chair Grooming: Set up Where Assessed - Grooming: Supported sitting Upper Body Bathing: Set up Where Assessed - Upper Body Bathing: Supported sitting Lower Body Bathing: Minimal assistance Where Assessed - Lower Body Bathing: Supported sit to stand Upper Body Dressing: Minimal assistance Where Assessed - Upper Body Dressing: Supported sitting Lower Body Dressing: Moderate assistance Where Assessed - Lower Body Dressing: Supported sit to stand Toilet Transfer: Water engineer Method: Sit to stand;Stand Ecologist: Bedside commode Transfers/Ambulation Related to ADLs: Min guard A transfers, ambulation in hall. Min A bed mobility back in bed at end of session. ADL Comments: Patient with foley and ostomy. Some pain with bending. Patient states wife can A with ADLs as needed.    OT Diagnosis: Generalized weakness;Acute pain  OT Problem List: Decreased activity tolerance;Decreased knowledge of use of DME or AE;Pain OT  Treatment Interventions: Self-care/ADL training;DME and/or AE instruction;Therapeutic activities;Patient/family education   OT Goals(Current goals can be found in the care plan section) Acute Rehab OT Goals Patient Stated Goal: to go home OT Goal Formulation: With patient Time For Goal Achievement: 06/06/13 Potential to Achieve Goals: Good  Visit Information  Last OT Received On: 05/23/13 Assistance Needed: +1 History of Present Illness: Patient is a 67 y/o male admitted for colectomy with diverting colostomy due to rectal cancer.       Prior [place] expects to be discharged to:: Private residence Living Arrangements: Spouse/significant other Available Help at Discharge: Family Type of Home: House Home Access: Stairs to enter Technical brewer of Steps: 1 Entrance Stairs-Rails: None Home Layout: One level Home Equipment: None Prior Function Level of Independence: Independent Communication Communication: No difficulties Dominant Hand: Right         Vision/Perception Vision - History Baseline Vision: No visual deficits   Cognition  Cognition Arousal/Alertness: Awake/alert Behavior During Therapy: WFL for tasks assessed/performed Overall Cognitive Status: Within Functional Limits for tasks assessed    Extremity/Trunk Assessment Upper Extremity Assessment Upper Extremity Assessment: Overall WFL for tasks assessed     Mobility Bed Mobility Bed Mobility: Not assessed Details for Bed Mobility Assistance: pt up in chair Transfers Sit to Stand: 4: Min guard;From chair/3-in-1 Stand to Sit: 4: Min guard;To chair/3-in-1 Details for Transfer Assistance: 25% VC's on proper tech and hand placement     Exercise     Balance     End of Session OT - End of Session Equipment Utilized During Treatment: Rolling walker;Gait belt Activity Tolerance: Patient tolerated treatment well Patient left: in bed;with call bell/phone within  reach Nurse Communication: Mobility status  GO     Sorayah Schrodt A 05/23/2013, 2:56 PM

## 2013-05-23 NOTE — Consult Note (Signed)
WOC ostomy consult note Stoma type/location: RLQ Loop ileostomy (initial post operative assessment) Stomal assessment/size: 1 and 1/4 inches round with red rubber catheter intact Peristomal assessment: intact, clear Treatment options for stomal/peristomal skin: None indicated, although we are using a skin barrier ring today Output thin, light green stool Ostomy pouching: 2pc. Ostomy pouching system with skin barrier ring; 2 and 1/4 inches. Education provided: Patient given ostomy teaching booklet, but it more interested in sleeping at the moment. RN reports he is still receiving 2mg  dilaudid.  I will visit earlier in day tomorrow and hopefully see when wife is present (she and I met preoperatively).  Patient taught that stoma is red, moist and healthy; that we have selected a 2-piece pouch for him initially and atht I will return tomorrow.  Patient does not look at stoma today.  Understand taht I will be back tomorrow and he is agreeable to session with wife. [facility] nursing team will follow and remain available to this patient, the nursing and surgical team.  CCS may wish to consider [facility] for post discharge support and continued teaching. Thanks, Maudie Flakes, MSN, RN, [place], [place], [place] [phone])

## 2013-05-23 NOTE — Progress Notes (Signed)
Physical Therapy Treatment Patient Details Name: Jason [name] MRN: [id number] DOB: 1946/04/08 Today's Date: 05/23/2013 Time: 1050-1120 PT Time Calculation (min): 30 min  PT Assessment / Plan / Recommendation  History of Present Illness Patient is a 67 y/o male admitted for colectomy with diverting colostomy due to rectal cancer.   PT Comments   Pt feeling nausea this am but agreed to amb.  OOB in recliner did amb in hallway twice with one sitting rest.   Follow Up Recommendations  Home health PT     Does the patient have the potential to tolerate intense rehabilitation     Barriers to Discharge        Equipment Recommendations       Recommendations for Other Services    Frequency Min 3X/week   Progress towards PT Goals Progress towards PT goals: Progressing toward goals  Plan      Precautions / Restrictions Precautions Precautions: Fall Restrictions Weight Bearing Restrictions: No    Pertinent Vitals/Pain C/o ABD pain     Mobility  Bed Mobility Bed Mobility: Not assessed Details for Bed Mobility Assistance: pt up in chair Transfers Transfers: Sit to Stand;Stand to Sit Sit to Stand: 4: Min guard;From chair/3-in-1 Stand to Sit: 4: Min guard;To chair/3-in-1 Details for Transfer Assistance: 25% VC's on proper tech and hand placement Ambulation/Gait Ambulation/Gait Assistance: 4: Min guard Ambulation Distance (Feet): 300 Feet (150'x 2) Assistive device: Rolling walker Ambulation/Gait Assistance Details: increased time and one VC on safety with turns Gait Pattern: Step-through pattern;Decreased stride length Gait velocity: decreased      PT Goals (current goals can now be found in the care plan section)    Visit Information  Last PT Received On: 05/23/13 Assistance Needed: +1 History of Present Illness: Patient is a 67 y/o male admitted for colectomy with diverting colostomy due to rectal cancer.    Subjective Data      Cognition       Balance     End  of Session PT - End of Session Equipment Utilized During Treatment: Gait belt Activity Tolerance: Patient tolerated treatment well Patient left: in chair;with call bell/phone within reach   [name]  PTA [facility]  Acute  Rehab Pager      [phone]

## 2013-05-23 NOTE — Clinical Documentation Improvement (Signed)
Pt w/ Chronic renal insufficiency stage II  Clarification Needed  Please clarify if chronic renal insufficiency stage II can be further specified as one of the diagnoses listed below and document in pn or d/c summary.    Possible Clinical Conditions?   _______CKD Stage I - GFR > OR = 90 _______CKD Stage II - GFR 60-80 _______CKD Stage III - GFR 30-59 _______CKD Stage IV - GFR 15-29 _______CKD Stage V - GFR < 15 _______ESRD (End Stage Renal Disease) _______Other condition_____________ _______Cannot Clinically determine   Supporting Information:  Risk Factors:  Chronic Renal insufficiency Rectal ca HTN ABLA  igns & Symptoms:  Diagnostics:  Treatment: Monitoring  Thank You, Heloise Beecham RN, BSN, MSN/Inf, CCDS Clinical Documentation Specialist [facility] Dept Pager: [phone] / E-mail: [email or web address]  [facility] [employer]

## 2013-05-23 NOTE — Progress Notes (Signed)
3 Days Post-Op Lap assisted LAR with coloanal anastomosis and diverting loop ileostomy Subjective: Pt without complaints, 1 episode of nausea last night, ambulating some  Objective: Vital signs in last 24 hours: Temp:  [98.6 F (37 C)-98.9 F (37.2 C)] 98.7 F (37.1 C) (11/17 0613) Pulse Rate:  [97-110] 108 (11/17 0613) Resp:  [18] 18 (11/17 0613) BP: (124-145)/(51-68) 145/51 mmHg (11/17 0613) SpO2:  [97 %-100 %] 98 % (11/17 0613)   Intake/Output from previous day: 11/16 0701 - 11/17 0700 In: 3148.3 [P.O.:240; I.V.:2908.3] Out: 3030 [Urine:2600; Drains:430] Intake/Output this shift: Total I/O In: -  Out: 80 [Drains:80]   General appearance: alert and cooperative Resp: no respiratory distress GI: normal findings: soft, non-tender non-distended  Incision: no significant drainage  Lab Results:   Recent Labs  05/22/13 0532 05/23/13 0452  WBC 12.5* 13.9*  HGB 7.5* 7.5*  HCT 21.7* 22.5*  PLT 141* 178   BMET  Recent Labs  05/22/13 0532 05/23/13 0452  NA 136 144  K 4.3 4.2  CL 106 109  CO2 21 23  GLUCOSE 84 95  BUN 43* 36*  CREATININE 2.56* 2.13*  CALCIUM 8.5 9.8   PT/INR No results found for this basename: LABPROT, INR,  in the last 72 hours ABG No results found for this basename: PHART, PCO2, PO2, HCO3,  in the last 72 hours  MEDS, Scheduled . allopurinol  300 mg Oral q morning - 10a  . amLODipine  10 mg Oral q morning - 10a  . atenolol  50 mg Oral Daily  . chlorthalidone  25 mg Oral Daily  . doxazosin  8 mg Oral QHS  . heparin subcutaneous  5,000 Units Subcutaneous Q8H  . magnesium sulfate 1 - 4 g bolus IVPB  4 g Intravenous Once    Studies/Results: No results found.  Assessment: s/p Procedure(s): LAPAROSCOPIC LOW ANTERIOR RESECTION WITH SPLENIC FLEXURE MOBILIZATION diverting OSTOMY Patient Active Problem List   Diagnosis Date Noted  . Rectal cancer 02/01/2013  . Anemia 07/20/2012  . Chronic renal insufficiency 07/31/2011  . EDEMA  09/21/2010  . OBSTRUCTIVE SLEEP APNEA 07/29/2010  . GOUT 05/11/2007  . ERECTILE DYSFUNCTION, MILD 05/11/2007  . HYPERTENSION 05/11/2007  . COLONIC POLYPS, HX OF 05/11/2007     Plan: Advance diet to clear liquids  Will switch to MIV at 68ml/h, good uop Continue foley due to acute urinary retention HR slightly elevated: BP ok, Hgb, stable, Cr down, O2 sats normal on room air, wbc up a bit today as well- unknown etiology.  Will monitor closely. Acute blood loss anemia, chronic anemia: s/p 1 unit pRBC's.  Will monitor for signs and symptoms for need for additional transfusions.      LOS: 3 days     .Rosario Adie, [facility], [place]   05/23/2013 9:30 AM

## 2013-05-23 NOTE — Progress Notes (Signed)
Dr Marcello Moores made aware of increased output from [place] over last 24 hours as well as pt's HR trending up into 100-110s w/ frequent PVCs.  She is aware and states to continue to monitor for the present time.

## 2013-05-23 NOTE — Op Note (Signed)
05/20/2013  7:22 AM  PATIENT:  Jason [name]  67 y.o. male  Patient Care Team: [name], MD as PCP - General [place] Dragon, MD as Attending Physician (Gastroenterology) Leighton Ruff, MD as Consulting Physician (General Surgery) [name], MD as Consulting Physician (Oncology) Carola Frost, RN as Registered Nurse  PRE-OPERATIVE DIAGNOSIS:  Distal rectal cancer   POST-OPERATIVE DIAGNOSIS:  Distal rectal cancer  PROCEDURE:  LAPAROSCOPIC LOW ANTERIOR RESECTION WITH SPLENIC FLEXURE MOBILIZATION DIVERTING LOOP ILEOSTOMY  SURGEON:  Surgeon(s): Leighton Ruff, MD Adin Hector, MD  ASSISTANT: Johney Maine   ANESTHESIA:   general  EBL:     DRAINS: (33F) Jackson-Pratt drain(s) with closed bulb suction in the pelvis   SPECIMEN:  Source of Specimen:  rectum  DISPOSITION OF SPECIMEN:  PATHOLOGY  COUNTS:  YES  PLAN OF CARE: Admit to inpatient   PATIENT DISPOSITION:  PACU - hemodynamically stable.  INDICATION: this is a 67 year old male who is status post neoadjuvant chemotherapy for a uT3N2 rectal cancer.  The risk and benefits of resection with coloanal anastomosis and diverting loop ileostomy versus resection and end ileostomy were explained to the patient in great detail. The patient elected to continue with coloanal anastomosis.  Metastatic workup was negative.   OR FINDINGS: significantly narrow pelvis. No signs of metastatic disease.  No signs of liver metastases. Rectal cancer noted at approximately 3-4 cm from anal verge in the left lateral position.  DESCRIPTION: the patient was identified in the preoperative holding area taken to the OR where he was laid supine on the operating room table. General anesthesia was smoothly induced. The patient was preoperatively marked for an ileostomy. He was then prepped and draped in usual sterile fashion in lithotomy position. A surgical timeout confirmed the correct patient, procedure, positioning and preoperative antibiotics.  SCDs were also noted to be in place prior to the initiation of anesthesia. After this was completed I began by making a supraumbilical incision with a scalpel. This was carried down through subcutaneous tissues until a umbilical hernia was identified. I placed my Hassan trocar through the hernia site. 2 stay sutures were placed in the fascial edges were fixation. I placed a 10 mm camera into the patient's abdomen. There were no signs of obvious metastatic disease. 2 more 5 mm ports were placed under direct visualization in the right upper right lower quadrant. 2 other 5 mm ports were placed in the left upper and left lower quadrants also under direct visualization. I evaluated the patient's liver. There were no signs of metastatic disease. The remaining portions of the abdomen were free of obvious metastases as well. I then placed the omentum over the liver and retracted the small bowel out of the pelvis. After this was completed I identified the sigmoid artery by the tenting of the mesentery. I dissected underneath this plane using immobilization. I identified the left ureter in a standard position. This was swept free from our dissection plane. I then skeletonized the sigmoid artery and vein. These were transected using the Enseal device. Hemostasis was good. I continued mobilization upwards a ligating the IMV as well. I then mobilized the left colon laterally taking down the white line of Toldt. I then mobilized the splenic flexure using the Enseal device. I continued my dissection to approximately the mid transverse colon taking down the gastrocolic ligament and entering the lesser sac. Once the entire splenic flexure was mobilized at her my attention to the rectum.  I then opened up  the presacral plane using blunt dissection and the Enseal device. The hypogastric nerve was identified and relatively adherent to the mesorectum of fat plane. This was carefully dissected free. I continued my dissection  posteriorly to the tip of the coccyx. I then dissected out both lateral planes of the mesorectum using the Enseal device. I then dissected anteriorly separating the seminal vesicles and prostate from the anterior plane of the rectum. I dissected laterally until I recently level of the levators. The left lateral sidewall was very adherent due to the tumor and chemotherapy reaction. I then proceeded to dissect out the anal canal. A [place] retractor was placed. I then used Bovie electric cautery to separate the anal mucosa at the dentate line circumferentially. I then dissected upwards until I encountered our previous dissection plane posteriorly. I then entered the pelvis and using blunt retraction as well as Bovie electrocautery to complete the rest of the dissection. There was a portion of the left lateral sidewall that was too adherent for me to mobilize. This was mobilized from above by Dr. Johney Maine laparoscopically. After this the entire rectum was free and was removed through the anal canal. The mesorectum plane appeared intact. There was a portion of the circumferential margin that appeared close. A small biopsy was taken of this area and sent to frozen for evaluation. There were no signs of cancer cells noted on frozen pathology report. Given what appeared to be negative circumferential margins and a good distal margin I decided to proceed with coloanal anastomosis. The sigmoid colon was brought down into the anal canal. This was transected sharply and had moderate bleeding noted. At created the coloanal anastomosis using interrupted 2-0 Vicryl sutures to the anal canal at the dentate line. After the anastomosis was complete the [employer] retractor was removed and I returned my attention to the patient's abdomen. A 19 French drain was brought out of the left upper quadrant port site and secured in place with a 2-0 nylon suture. This was then placed into the pelvis. I then identified the terminal ileum and  dissected underneath an area of the ileum that appeared to reach to my ileostomy site. A rubber of the catheter was placed around the terminal ileum and an ostomy site was made using Bovie electrocautery and blunt dissection. A cruciate incision was made along the fascia.  The rectus muscle was split and the peritoneum was entered with electrocautery. The rubber catheter was brought out through the site, bringing the loop of ileum with it. We then placed the camera back laparoscopically and identified the distal limb and make sure that there was no twisting of the loop. After this was completed the omentum was brought back down over the abdominal structures. The abdomen was then desufflated. The umbilical port site was closed using 3 interrupted 0 Vicryl sutures. The skin of all the port sites was then closed using interrupted 4-0 Vicryl sutures. Dermabond was used over this. The ileostomy was then created in standard [facility] fashion. This was done using interrupted 2-0 Vicryl sutures. The rib rather catheter was transformed into a ostomy bar and sutured into place with a silk suture. An ostomy appliance was then placed. The patient was awakened from anesthesia and sent to the postanesthesia care unit stable condition. All counts were correct per operating room staff.

## 2013-05-24 LAB — BASIC METABOLIC PANEL
BUN: 28 mg/dL — ABNORMAL HIGH (ref 6–23)
CO2: 23 mEq/L (ref 19–32)
Calcium: 9.4 mg/dL (ref 8.4–10.5)
Chloride: 111 mEq/L (ref 96–112)
Creatinine, Ser: 1.9 mg/dL — ABNORMAL HIGH (ref 0.50–1.35)
Glucose, Bld: 145 mg/dL — ABNORMAL HIGH (ref 70–99)
Potassium: 4.1 mEq/L (ref 3.5–5.1)

## 2013-05-24 LAB — CBC
HCT: 22.7 % — ABNORMAL LOW (ref 39.0–52.0)
MCH: 29.1 pg (ref 26.0–34.0)
MCHC: 33 g/dL (ref 30.0–36.0)
MCV: 88 fL (ref 78.0–100.0)
Platelets: 225 10*3/uL (ref 150–400)
RDW: 17 % — ABNORMAL HIGH (ref 11.5–15.5)

## 2013-05-24 MED ORDER — PROMETHAZINE HCL 25 MG/ML IJ SOLN
12.5000 mg | INTRAMUSCULAR | Status: DC | PRN
  Administered 2013-05-24: 13:00:00 12.5 mg via INTRAVENOUS
  Filled 2013-05-24: qty 1

## 2013-05-24 MED ORDER — ONDANSETRON HCL 4 MG/2ML IJ SOLN
4.0000 mg | Freq: Four times a day (QID) | INTRAMUSCULAR | Status: DC | PRN
  Administered 2013-05-24 – 2013-06-26 (×16): 4 mg via INTRAVENOUS
  Filled 2013-05-24 (×16): qty 2

## 2013-05-24 NOTE — Progress Notes (Signed)
4 Days Post-Op Lap assisted LAR with coloanal anastomosis and diverting loop ileostomy Subjective: Pt with slight nausea this am, had a small amt of clears yesterday, good uop, labs stable  Objective: Vital signs in last 24 hours: Temp:  [98.4 F (36.9 C)-99.2 F (37.3 C)] 98.6 F (37 C) (11/18 0512) Pulse Rate:  [100-109] 100 (11/17 2130) Resp:  [18] 18 (11/18 0512) BP: (113-147)/(54-72) 120/68 mmHg (11/18 0512) SpO2:  [94 %-98 %] 96 % (11/18 0512)   Intake/Output from previous day: 11/17 0701 - 11/18 0700 In: 282.5 [P.O.:115; I.V.:167.5] Out: 2900 [Urine:1570; Drains:1270; Stool:60] Intake/Output this shift:   General appearance: alert and cooperative Resp: no respiratory distress GI: normal findings: soft, non-tender non-distended JP: serosang discharge, 125ml over 24h Incision: no significant drainage Ostomy: pink, viable, no stool output  Lab Results:   Recent Labs  05/23/13 0452 05/24/13 0445  WBC 13.9* 13.9*  HGB 7.5* 7.5*  HCT 22.5* 22.7*  PLT 178 225   BMET  Recent Labs  05/23/13 0452 05/24/13 0445  NA 144 141  K 4.2 4.1  CL 109 111  CO2 23 23  GLUCOSE 95 145*  BUN 36* 28*  CREATININE 2.13* 1.90*  CALCIUM 9.8 9.4   PT/INR No results found for this basename: LABPROT, INR,  in the last 72 hours ABG No results found for this basename: PHART, PCO2, PO2, HCO3,  in the last 72 hours  MEDS, Scheduled . allopurinol  300 mg Oral q morning - 10a  . amLODipine  10 mg Oral q morning - 10a  . atenolol  50 mg Oral Daily  . chlorthalidone  25 mg Oral Daily  . doxazosin  8 mg Oral QHS  . heparin subcutaneous  5,000 Units Subcutaneous Q8H    Studies/Results: No results found.  Assessment: s/p Procedure(s): LAPAROSCOPIC LOW ANTERIOR RESECTION WITH SPLENIC FLEXURE MOBILIZATION diverting OSTOMY Patient Active Problem List   Diagnosis Date Noted  . Rectal cancer 02/01/2013  . Anemia 07/20/2012  . Chronic renal insufficiency 07/31/2011  . EDEMA  09/21/2010  . OBSTRUCTIVE SLEEP APNEA 07/29/2010  . GOUT 05/11/2007  . ERECTILE DYSFUNCTION, MILD 05/11/2007  . HYPERTENSION 05/11/2007  . COLONIC POLYPS, HX OF 05/11/2007     Plan: Cont clear liquids, pt appears to have a slight ileus Will switch to MIV at 73ml/h, good uop Continue foley due to acute urinary retention HR slightly elevated: BP ok, Hgb, stable, Cr back to baseline, O2 sats normal on room air, wbc the same today.  Will cont to monitor closely. Acute blood loss anemia, chronic anemia: s/p 1 unit pRBC's.  Will monitor for signs and symptoms for need for additional transfusions.  Hgb and HR stable currently    LOS: 4 days     .Rosario Adie, MD [facility], [place]   05/24/2013 8:15 AM

## 2013-05-24 NOTE — Care Management Note (Addendum)
    Page 1 of 2   06/16/2013     12:39:22 PM   CARE MANAGEMENT NOTE 06/16/2013  Patient:  Jason [name], Jason [name]   Account Number:  [id number]  Date Initiated:  05/21/2013  Documentation initiated by:  [facility]  Subjective/Objective Assessment:   67 year old male admitted s/p LAPAROSCOPIC LOW ANTERIOR RESECTION WITH SPLENIC FLEXURE MOBILIZATION, diverting OSTOMY     Action/Plan:   From home.   Anticipated DC Date:  06/20/2013   Anticipated DC Plan:  [place]  CM consult      Choice offered to / List presented to:  C-1 Patient           Status of service:  In process, will continue to follow Medicare Important Message given?  NA - LOS <3 / Initial given by admissions (If response is "NO", the following Medicare IM given date fields will be blank) Date Medicare IM given:   Date Additional Medicare IM given:    Discharge Disposition:    Per UR Regulation:  Reviewed for med. necessity/level of care/duration of stay  If discussed at [place] of Stay Meetings, dates discussed:   05/26/2013  05/31/2013  06/07/2013  06/09/2013  06/14/2013  06/16/2013    Comments:  06/16/13 Dartanyan Deasis RN,BSN NCM [id number] POD#27,D/C NGT,CLEARS,TPN,ILEOSTOMY-HIGH OUTPUT.NEPHROLOGY FOLLOWING-NO ACUTE NEED FOR DIALYSIS,MONITORING BUN/CR.D/C PLAN HOME W/HHRN/PT/OT-[facility] FOLLOWING.AWAIT FINAL HHC ORDERS.  06/14/13 Monterrio Gerst RN,BSN NCM [place]-CURRENTLY NO DIALYSIS.D/C PLAN HOME W/HH-[facility].  06/13/13 Levetta Bognar RN,BSN NCM [id number] POD#24,NGT CLAMPING,HICCUPS,CLEARS,TPN,ILEOSTOMY.D/C PLAN HOME W/HH-[facility].  06/09/13 Kamyiah Colantonio RN,BSN NCM [id number] POD#20, NGT-OUTPUT,IVF REPLACEMENT,IV ABX.D/C HOME [place].[facility] FOLLOWING.  06/08/13 Magnus Crescenzo RN,BSN NCM [id number] POD#19,PER MD-PATIENT HAS AGREED TO NGT.IELOSTOMY.NPO,IVF,IV ABX.D/C HOME [place] FOLLOWING.  06/06/13 Cristle Jared RN,BSN  NCM [id number] POD#17,P/O ILEUS,ILEOSTOMY-HIGH OUTPUT.NPO,TPN@65 ,IVF@100 ,IV ABX.D/C PLAN HOME [facility] ALREADY FOLLOWING HHRN/PT.AWAIT FINAL HHC ORDERS.  06/03/13 Titianna Loomis RN,BSN NCM [id number] POD#14,NGT D/C,JP D/C.TNA WEANING,LOW FIBER DIET.IVF,IV ABX.[facility] FOLLOWING FOR HHC ORDERS.RECOMMEND HHRN/PT.AWAIT FINAL HHC ORDERS.  05/30/13 Rayshaun Needle RN,BSN NCM [id number] POD#10, P/O ILEUS,NGT-SUCTION,NPO,PICC-TNA,JP-SEROSANG.KRUN,IV DILAUDID.D/C PLAN HOME [facility] HHRN/PT.  05/27/13 Haizel Gatchell RN,BSN NCM [id number] POD#7,N/V,PICC-TNA,JP INTACT.[facility] FOLLOWING FOR HHC HHRN/PT.  05/25/13 Ricci Paff RN,BSN NCM [place] FOLLOWING FOR HHC HHRN/PT.AWAIT HHC FINAL ORDERS.  05/24/13 Sven Pinheiro RN,BSN NCM [phone] [facility] CHOSEN FOR HHC,KRISTEN REP AWARE & FOLLOWING.AWAIT FINAL HHC ORDERS. POD#4 COLOANAL ANASTOMOSIS, &  DIVERTING ILEOSTOMY.CLEARS,S/P 2UPRBC,ILEUS.D/C PLAN HOME [place].HHRN/PT/AWAIT FINAL HH ORDERS.PROVIDED PATIENT [facility] AGENCY LIST.

## 2013-05-24 NOTE — Progress Notes (Signed)
Pt w/ hiccups and nausea after several bites of jello for lunch. Not due for Zofran until 1400. Dr Marcello Moores aware and orders received for NPO and IV Phenergan.

## 2013-05-25 LAB — BASIC METABOLIC PANEL
BUN: 24 mg/dL — ABNORMAL HIGH (ref 6–23)
Calcium: 9.5 mg/dL (ref 8.4–10.5)
Chloride: 106 mEq/L (ref 96–112)
Creatinine, Ser: 2.02 mg/dL — ABNORMAL HIGH (ref 0.50–1.35)
GFR calc Af Amer: 38 mL/min — ABNORMAL LOW (ref >90)
GFR calc non Af Amer: 33 mL/min — ABNORMAL LOW (ref >90)
Potassium: 3.6 mEq/L (ref 3.5–5.1)

## 2013-05-25 LAB — CBC
HCT: 22.5 % — ABNORMAL LOW (ref 39.0–52.0)
MCHC: 33.3 g/dL (ref 30.0–36.0)
MCV: 88.2 fL (ref 78.0–100.0)
Platelets: 217 10*3/uL (ref 150–400)
RDW: 16.7 % — ABNORMAL HIGH (ref 11.5–15.5)
WBC: 12 10*3/uL — ABNORMAL HIGH (ref 4.0–10.5)

## 2013-05-25 LAB — PHOSPHORUS: Phosphorus: 2.7 mg/dL (ref 2.3–4.6)

## 2013-05-25 MED ORDER — MAGNESIUM SULFATE 40 MG/ML IJ SOLN
2.0000 g | Freq: Once | INTRAMUSCULAR | Status: AC
  Administered 2013-05-25: 09:00:00 2 g via INTRAVENOUS
  Filled 2013-05-25: qty 50

## 2013-05-25 NOTE — Progress Notes (Signed)
I have reviewed this note and agree with all findings. Kati Cannan Beeck, PT, DPT Pager: [phone]   

## 2013-05-25 NOTE — Progress Notes (Signed)
Physical Therapy Treatment Patient Details Name: Jason [name] MRN: [id number] DOB: 04-03-1946 Today's Date: 05/25/2013 Time: [id number] PT Time Calculation (min): 26 min  PT Assessment / Plan / Recommendation  History of Present Illness Patient is a 67 y/o male admitted for colectomy with diverting colostomy due to rectal cancer.   PT Comments   Pt progressing towards goals as he was able to ambulate 250' with RW and min guard and perform LE strengthening exercises. Pt continues to be limited by fatigue. Pt would benefit from skilled PT to improve functional mobility and safety.  Follow Up Recommendations  Home health PT     Does the patient have the potential to tolerate intense rehabilitation     Barriers to Discharge        Equipment Recommendations  Other (comment) (TBD: pt need to check with family)    Recommendations for Other Services    Frequency Min 3X/week   Progress towards PT Goals Progress towards PT goals: Progressing toward goals  Plan Current plan remains appropriate    Precautions / Restrictions Precautions Precautions: Fall Restrictions Weight Bearing Restrictions: No   Pertinent Vitals/Pain No c/o pain at rest, with reported abdominal pain during exercises (3/10). Pt took rest breaks between each exercises to decrease pain and positioned to comfort at end of session.    Mobility  Bed Mobility Bed Mobility: Supine to Sit Supine to Sit: 4: Min assist Details for Bed Mobility Assistance: assist to guide trunk into sitting. Pt did not wish to attempt log roll. VC's for handplacement. pt required increased time during all mobility due to pt reporting feeling lightheadedness upon sitting EOB, pt remained seated  until symptoms subsided. Transfers Transfers: Sit to Stand;Stand to Sit Sit to Stand: 4: Min guard;From bed;With upper extremity assist Stand to Sit: To chair/3-in-1;With upper extremity assist;4: Min guard;With armrests Details for Transfer  Assistance: min guard to ensure safety. VC's for hand placement. Ambulation/Gait Ambulation/Gait Assistance: 4: Min guard Ambulation Distance (Feet): 250 Feet Assistive device: Rolling walker Ambulation/Gait Assistance Details: min guard to ensure safety.  Gait Pattern: Step-through pattern;Decreased stride length Gait velocity: decreased    Exercises General Exercises - Lower Extremity Ankle Circles/Pumps: AROM;20 reps;Seated;Both Short Arc Quad: AROM;20 reps;Supine;Both (pt required rest breaks during all exercises due to fatigue.) Long Arc Quad: AROM;20 reps;Seated;Both Heel Slides: AROM;Supine;Both;10 reps Hip ABduction/ADduction: AROM;20 reps;Supine;Both Hip Flexion/Marching: AROM;20 reps;Seated;Both   PT Diagnosis:    PT Problem List:   PT Treatment Interventions:     PT Goals (current goals can now be found in the care plan section)    Visit Information  Last PT Received On: 05/25/13 Assistance Needed: +1 History of Present Illness: Patient is a 67 y/o male admitted for colectomy with diverting colostomy due to rectal cancer.    Subjective Data      Cognition  Cognition Arousal/Alertness: Awake/alert Behavior During Therapy: WFL for tasks assessed/performed Overall Cognitive Status: Within Functional Limits for tasks assessed    Balance     End of Session PT - End of Session Activity Tolerance: Patient tolerated treatment well Patient left: in chair;with call bell/phone within reach Nurse Communication: Mobility status   GP     Audie Clear 05/25/2013, 12:32 PM

## 2013-05-25 NOTE — Consult Note (Addendum)
WOC ostomy follow up Enrolled patient in [employer] Discharge program: Yes [place] nursing team will follow. Thanks, Maudie Flakes, MSN, RN, [place], [place], [place] [phone])

## 2013-05-25 NOTE — Progress Notes (Signed)
5 Days Post-Op Lap assisted LAR with coloanal anastomosis and diverting loop ileostomy Subjective: Pt with no nausea this am, had hiccups and nausea yesterday and was made NPO, good uop, labs stable, wbc down  Objective: Vital signs in last 24 hours: Temp:  [98.8 F (37.1 C)-99.5 F (37.5 C)] 99.5 F (37.5 C) (11/19 0620) Pulse Rate:  [88-97] 95 (11/19 0620) Resp:  [14-16] 16 (11/19 0620) BP: (116-130)/(60-65) 116/65 mmHg (11/19 0620) SpO2:  [98 %-100 %] 100 % (11/19 0620)   Intake/Output from previous day: 11/18 0701 - 11/19 0700 In: 2297.1 [P.O.:240; I.V.:2057.1] Out: 2155 [Urine:1675; Drains:445; Stool:35] Intake/Output this shift:   General appearance: alert and cooperative Resp: no respiratory distress GI: normal findings: soft, non-tender less distended JP: serosang discharge, 415ml over 24h Incision: no significant drainage Ostomy: beefy red, viable, no real stool output  Lab Results:   Recent Labs  05/24/13 0445 05/25/13 0435  WBC 13.9* 12.0*  HGB 7.5* 7.5*  HCT 22.7* 22.5*  PLT 225 217   BMET  Recent Labs  05/24/13 0445 05/25/13 0435  NA 141 141  K 4.1 3.6  CL 111 106  CO2 23 27  GLUCOSE 145* 120*  BUN 28* 24*  CREATININE 1.90* 2.02*  CALCIUM 9.4 9.5   PT/INR No results found for this basename: LABPROT, INR,  in the last 72 hours ABG No results found for this basename: PHART, PCO2, PO2, HCO3,  in the last 72 hours  MEDS, Scheduled . allopurinol  300 mg Oral q morning - 10a  . amLODipine  10 mg Oral q morning - 10a  . atenolol  50 mg Oral Daily  . chlorthalidone  25 mg Oral Daily  . doxazosin  8 mg Oral QHS  . heparin subcutaneous  5,000 Units Subcutaneous Q8H  . magnesium sulfate 1 - 4 g bolus IVPB  2 g Intravenous Once    Studies/Results: No results found.  Assessment: s/p Procedure(s): LAPAROSCOPIC LOW ANTERIOR RESECTION WITH SPLENIC FLEXURE MOBILIZATION diverting OSTOMY Patient Active Problem List   Diagnosis Date Noted  .  Rectal cancer 02/01/2013  . Anemia 07/20/2012  . Chronic renal insufficiency 07/31/2011  . EDEMA 09/21/2010  . OBSTRUCTIVE SLEEP APNEA 07/29/2010  . GOUT 05/11/2007  . ERECTILE DYSFUNCTION, MILD 05/11/2007  . HYPERTENSION 05/11/2007  . COLONIC POLYPS, HX OF 05/11/2007     Plan: Cont NPO, pt appears to have a post-op ileus Will increase MIV to 41ml/h, given NPO status Continue foley due to acute urinary retention HR better, BP ok, Hgb stable, Cr around baseline, O2 sats normal on room air, wbc down today.  Will cont to monitor closely. Acute blood loss anemia, chronic anemia: s/p 1 unit pRBC's.  Will monitor for signs and symptoms for need for additional transfusions.  Hgb and HR stable currently Will check nutrition labs in AM    LOS: 5 days     .Rosario Adie, MD [facility], [place]   05/25/2013 8:45 AM

## 2013-05-26 ENCOUNTER — Inpatient Hospital Stay (HOSPITAL_COMMUNITY): Payer: Medicare Other

## 2013-05-26 LAB — COMPREHENSIVE METABOLIC PANEL
ALT: 40 U/L (ref 0–53)
AST: 57 U/L — ABNORMAL HIGH (ref 0–37)
BUN: 23 mg/dL (ref 6–23)
CO2: 27 mEq/L (ref 19–32)
Calcium: 9.3 mg/dL (ref 8.4–10.5)
Creatinine, Ser: 1.9 mg/dL — ABNORMAL HIGH (ref 0.50–1.35)
GFR calc Af Amer: 41 mL/min — ABNORMAL LOW (ref >90)
GFR calc non Af Amer: 35 mL/min — ABNORMAL LOW (ref >90)
Potassium: 3.6 mEq/L (ref 3.5–5.1)
Total Bilirubin: 0.3 mg/dL (ref 0.3–1.2)
Total Protein: 5.8 g/dL — ABNORMAL LOW (ref 6.0–8.3)

## 2013-05-26 LAB — CBC
MCV: 88.1 fL (ref 78.0–100.0)
Platelets: 230 10*3/uL (ref 150–400)
RBC: 2.69 MIL/uL — ABNORMAL LOW (ref 4.22–5.81)
RDW: 16.9 % — ABNORMAL HIGH (ref 11.5–15.5)
WBC: 10.3 10*3/uL (ref 4.0–10.5)

## 2013-05-26 LAB — PREALBUMIN: Prealbumin: 9.8 mg/dL — ABNORMAL LOW (ref 17.0–34.0)

## 2013-05-26 LAB — PROTIME-INR
INR: 1.16 (ref 0.00–1.49)
Prothrombin Time: 14.6 seconds (ref 11.6–15.2)

## 2013-05-26 MED ORDER — SODIUM CHLORIDE 0.9 % IJ SOLN
10.0000 mL | INTRAMUSCULAR | Status: DC | PRN
  Administered 2013-05-28 – 2013-06-01 (×4): 10 mL
  Administered 2013-06-01: 17:00:00 20 mL
  Administered 2013-06-02 – 2013-06-03 (×2): 10 mL
  Administered 2013-06-05 – 2013-06-06 (×2): 20 mL
  Administered 2013-06-07 – 2013-06-24 (×17): 10 mL
  Administered 2013-06-24: 20 mL
  Administered 2013-06-25: 10 mL
  Administered 2013-06-25 – 2013-06-27 (×4): 20 mL
  Administered 2013-06-28: 10 mL

## 2013-05-26 NOTE — Progress Notes (Signed)
6 Days Post-Op Lap assisted LAR with coloanal anastomosis and diverting loop ileostomy Subjective: Pt with no nausea this am, good uop, labs stable, wbc down.  Ambulating.  No complaints  Objective: Vital signs in last 24 hours: Temp:  [98.4 F (36.9 C)-99.8 F (37.7 C)] 98.7 F (37.1 C) (11/20 0544) Pulse Rate:  [82-88] 86 (11/20 0544) Resp:  [12-18] 18 (11/20 0544) BP: (113-123)/(57-70) 120/57 mmHg (11/20 0544) SpO2:  [97 %-100 %] 97 % (11/20 0544)   Intake/Output from previous day: 11/19 0701 - 11/20 0700 In: 2046.3 [I.V.:2046.3] Out: 2090 [Urine:1825; Drains:265] Intake/Output this shift: Total I/O In: -  Out: 95 [Drains:95] General appearance: alert and cooperative Resp: no respiratory distress GI: normal findings: soft, non-tender less distended JP: serosang discharge, 219ml over 24h Incision: no significant drainage Ostomy: beefy red, viable, no real stool or air output  Lab Results:   Recent Labs  05/25/13 0435 05/26/13 0444  WBC 12.0* 10.3  HGB 7.5* 7.8*  HCT 22.5* 23.7*  PLT 217 230   BMET  Recent Labs  05/25/13 0435 05/26/13 0444  NA 141 144  K 3.6 3.6  CL 106 107  CO2 27 27  GLUCOSE 120* 125*  BUN 24* 23  CREATININE 2.02* 1.90*  CALCIUM 9.5 9.3   PT/INR  Recent Labs  05/26/13 0444  LABPROT 14.6  INR 1.16   ABG No results found for this basename: PHART, PCO2, PO2, HCO3,  in the last 72 hours  MEDS, Scheduled . allopurinol  300 mg Oral q morning - 10a  . amLODipine  10 mg Oral q morning - 10a  . atenolol  50 mg Oral Daily  . chlorthalidone  25 mg Oral Daily  . doxazosin  8 mg Oral QHS  . heparin subcutaneous  5,000 Units Subcutaneous Q8H    Studies/Results: No results found.  Assessment: s/p Procedure(s): LAPAROSCOPIC LOW ANTERIOR RESECTION WITH SPLENIC FLEXURE MOBILIZATION diverting OSTOMY Patient Active Problem List   Diagnosis Date Noted  . Rectal cancer 02/01/2013  . Anemia 07/20/2012  . Chronic renal insufficiency  07/31/2011  . EDEMA 09/21/2010  . OBSTRUCTIVE SLEEP APNEA 07/29/2010  . GOUT 05/11/2007  . ERECTILE DYSFUNCTION, MILD 05/11/2007  . HYPERTENSION 05/11/2007  . COLONIC POLYPS, HX OF 05/11/2007     Plan: pt appears to have a post-op ileus, nausea better, will try some sips of clears Cont MIV at 14ml/h, given NPO status Will remove foley again today and [place] if no UOP after 8h Acute blood loss anemia, chronic anemia: s/p 1 unit pRBC's.  Will monitor for signs and symptoms for need for additional transfusions.  Hgb and HR stable currently Acute protein calorie malnutrition: Alb 2.4, prealb pending.  May need to start TPN in the next couple days if bowel function does not return.    LOS: 6 days     .Rosario Adie, MD [facility], [place]   05/26/2013 7:33 AM

## 2013-05-26 NOTE — Progress Notes (Signed)
Peripherally Inserted Central Catheter/Midline Placement  The IV Nurse has discussed with the patient and/or persons authorized to consent for the patient, the purpose of this procedure and the potential benefits and risks involved with this procedure.  The benefits include less needle sticks, lab draws from the catheter and patient may be discharged home with the catheter.  Risks include, but not limited to, infection, bleeding, blood clot (thrombus formation), and puncture of an artery; nerve damage and irregular heat beat.  Alternatives to this procedure were also discussed.  PICC/Midline Placement Documentation        Jason [name] 05/26/2013, 2:45 PM

## 2013-05-26 NOTE — Consult Note (Signed)
WOC ostomy follow up Stoma type/location: RLQ loop ileostomy Stomal assessment/size: 1 and 1/4 inches round with red rubber catheter bridge Peristomal assessment: intact, clear Treatment options for stomal/peristomal skin: None indicated although I used a skin barrier ring for peristomal skin protection. Output small amount serous effluent.  No real return of bowel function.  Patient with hiccups at the moment. Ostomy pouching: 2pc., 2 and 1/4 with skin barrier ring. Education provided: Patient taught Lock and Roll closure today, but fell aslepp twice during the session.  Able to give return demo with coaching. May require HHRN or higher level of support. Enrolled patient in [employer] Start Discharge program: Yes [place] nursing team will not follow, but will remain available to this patient, the nursing and medical team.  Please re-consult if needed. Thanks, Maudie Flakes, MSN, RN, [place], [place], [place] [phone])

## 2013-05-26 NOTE — Progress Notes (Signed)
Patient foley d/c'ed at 11am.  Patient unable to void, no UOP.  Patient I/O cath per MD order.  673ml yellow, cloudy urine returned.  Patient tolerated well.  Will pass information on to night RN and continue to monitor.

## 2013-05-27 ENCOUNTER — Inpatient Hospital Stay (HOSPITAL_COMMUNITY): Payer: Medicare Other

## 2013-05-27 LAB — PHOSPHORUS: Phosphorus: 3.3 mg/dL (ref 2.3–4.6)

## 2013-05-27 LAB — GLUCOSE, CAPILLARY

## 2013-05-27 LAB — MAGNESIUM: Magnesium: 1.7 mg/dL (ref 1.5–2.5)

## 2013-05-27 LAB — BASIC METABOLIC PANEL
BUN: 26 mg/dL — ABNORMAL HIGH (ref 6–23)
CO2: 29 mEq/L (ref 19–32)
Calcium: 9.4 mg/dL (ref 8.4–10.5)
Creatinine, Ser: 2.03 mg/dL — ABNORMAL HIGH (ref 0.50–1.35)
GFR calc non Af Amer: 32 mL/min — ABNORMAL LOW (ref >90)
Glucose, Bld: 145 mg/dL — ABNORMAL HIGH (ref 70–99)
Sodium: 142 mEq/L (ref 135–145)

## 2013-05-27 MED ORDER — FAT EMULSION 20 % IV EMUL
240.0000 mL | INTRAVENOUS | Status: AC
  Administered 2013-05-27: 240 mL via INTRAVENOUS
  Filled 2013-05-27: qty 250

## 2013-05-27 MED ORDER — INSULIN ASPART 100 UNIT/ML ~~LOC~~ SOLN
0.0000 [IU] | Freq: Four times a day (QID) | SUBCUTANEOUS | Status: DC
  Administered 2013-05-27 – 2013-05-30 (×10): 1 [IU] via SUBCUTANEOUS
  Administered 2013-05-30 (×2): 2 [IU] via SUBCUTANEOUS
  Administered 2013-05-30: 1 [IU] via SUBCUTANEOUS
  Administered 2013-05-31 (×3): 2 [IU] via SUBCUTANEOUS
  Administered 2013-05-31: 1 [IU] via SUBCUTANEOUS
  Administered 2013-06-01: 07:00:00 2 [IU] via SUBCUTANEOUS
  Administered 2013-06-01: 01:00:00 1 [IU] via SUBCUTANEOUS
  Administered 2013-06-01: 12:00:00 2 [IU] via SUBCUTANEOUS
  Administered 2013-06-02: 12:00:00 1 [IU] via SUBCUTANEOUS
  Administered 2013-06-02: 2 [IU] via SUBCUTANEOUS
  Administered 2013-06-02 – 2013-06-03 (×3): 1 [IU] via SUBCUTANEOUS
  Administered 2013-06-03: 2 [IU] via SUBCUTANEOUS
  Administered 2013-06-03: 1 [IU] via SUBCUTANEOUS
  Administered 2013-06-04: 2 [IU] via SUBCUTANEOUS
  Administered 2013-06-04: 12:00:00 3 [IU] via SUBCUTANEOUS
  Administered 2013-06-04 – 2013-06-05 (×2): 1 [IU] via SUBCUTANEOUS

## 2013-05-27 MED ORDER — KCL IN DEXTROSE-NACL 20-5-0.45 MEQ/L-%-% IV SOLN
INTRAVENOUS | Status: AC
  Administered 2013-05-27: 18:00:00 via INTRAVENOUS
  Filled 2013-05-27: qty 1000

## 2013-05-27 MED ORDER — TRACE MINERALS CR-CU-F-FE-I-MN-MO-SE-ZN IV SOLN
INTRAVENOUS | Status: AC
  Administered 2013-05-27: 18:00:00 via INTRAVENOUS
  Filled 2013-05-27: qty 1000

## 2013-05-27 MED ORDER — IOHEXOL 300 MG/ML  SOLN: 50.0000 mL | Freq: Once | INTRAMUSCULAR | Status: DC | PRN

## 2013-05-27 NOTE — Progress Notes (Addendum)
INITIAL NUTRITION ASSESSMENT  DOCUMENTATION CODES Per approved criteria  -Obesity Unspecified   INTERVENTION: TPN per Pharmacy; see recommendations below Diet advancement per MD discretion RD to continue to monitor nutrition care plan  NUTRITION DIAGNOSIS: Inadequate oral intake related to medical condition as evidenced by NPO status.   Goal: Pt to meet >/= 90% of their estimated nutrition needs   Monitor:  TPN rate Weight Labs Diet advancement  Reason for Assessment: Consult, new TPN  67 y.o. male  Admitting Dx: Rectal cancer  ASSESSMENT: 67 year old male with rectal cancer s/ neoadjuvant chemoRt with history of HTN, Gout, diverticulosis, chronic renal insufficiency, and anemia. Pt now 7 days s/p lap assisted LAR with coloanal anastomosis and diverting loop ileostomy. Pt with post-op ileus; PICC placed 11/20 and plan to start pt on TPN tonight. Pt has been NPO since 11/14 except for one day of clear liquids on 11/17. Pt being brought to CT scan at time of visit. Per MST report, pt denied recent weight loss and decreased appetite PTA.    Height: Ht Readings from Last 1 Encounters:  05/20/13 5\' 7"  (1.702 m)    Weight: Wt Readings from Last 1 Encounters:  05/20/13 199 lb (90.266 kg)    Ideal Body Weight: 148 lbs  % Ideal Body Weight: 134%  Wt Readings from Last 10 Encounters:  05/20/13 199 lb (90.266 kg)  05/20/13 199 lb (90.266 kg)  05/16/13 199 lb (90.266 kg)  04/28/13 194 lb 14.4 oz (88.406 kg)  04/28/13 194 lb 11.2 oz (88.315 kg)  04/06/13 188 lb 12.8 oz (85.639 kg)  03/31/13 191 lb 11.2 oz (86.955 kg)  03/25/13 190 lb 6.4 oz (86.365 kg)  03/25/13 191 lb 1.6 oz (86.682 kg)  03/18/13 193 lb 3.2 oz (87.635 kg)    Usual Body Weight: 230 lbs (May 2013)  % Usual Body Weight: 87%  BMI:  Body mass index is 31.16 kg/(m^2).  Estimated Nutritional Needs: Kcal: 1800- 2000 Protein: 100-110 grams Fluid: 2.3-2.5 L/day  Skin: facial edema; abdominal incision,  rectal incision  Diet Order: NPO  EDUCATION NEEDS: -No education needs identified at this time   Intake/Output Summary (Last 24 hours) at 05/27/13 1116 Last data filed at 05/27/13 0900  Gross per 24 hour  Intake    975 ml  Output   1200 ml  Net   -225 ml    Last BM: 11/19  Labs:   Recent Labs Lab 05/23/13 0452  05/25/13 0435 05/26/13 0444 05/27/13 0933  NA 144  < > 141 144 142  K 4.2  < > 3.6 3.6 4.0  CL 109  < > 106 107 106  CO2 23  < > 27 27 29   BUN 36*  < > 24* 23 26*  CREATININE 2.13*  < > 2.02* 1.90* 2.03*  CALCIUM 9.8  < > 9.5 9.3 9.4  MG 1.4*  --  1.8 1.8 1.7  PHOS 2.8  --  2.7  --  3.3  GLUCOSE 95  < > 120* 125* 145*  < > = values in this interval not displayed.  CBG (last 3)  No results found for this basename: GLUCAP,  in the last 72 hours  Scheduled Meds: . allopurinol  300 mg Oral q morning - 10a  . amLODipine  10 mg Oral q morning - 10a  . atenolol  50 mg Oral Daily  . chlorthalidone  25 mg Oral Daily  . doxazosin  8 mg Oral QHS  . heparin subcutaneous  5,000 Units Subcutaneous Q8H  . insulin aspart  0-9 Units Subcutaneous Q6H    Continuous Infusions: . dextrose 5 % and 0.45 % NaCl with KCl 20 mEq/L 75 mL/hr at 05/26/13 1523  . dextrose 5 % and 0.45 % NaCl with KCl 20 mEq/L    . Marland KitchenTPN (CLINIMIX-E) Adult     And  . fat emulsion      Past Medical History  Diagnosis Date  . Gout   . Hypertension   . Hyperplastic colon polyp   . Diverticulosis   . Anemia   . Arthritis   . ED (erectile dysfunction)   . Chronic renal insufficiency, stage II (mild)   . rectal ca dx'd 02/01/13    rectal  . History of radiation therapy 02/21/13-03/31/13    rectum 50.4Gy total dose    Past Surgical History  Procedure Laterality Date  . Colonoscopy  2010    [facility]  . Eus N/A 02/03/2013    Procedure: LOWER ENDOSCOPIC ULTRASOUND (EUS);  Surgeon: Milus Banister, MD;  Location: [facility];  Service: Endoscopy;  Laterality: N/A;  . Laparoscopic low  anterior resection N/A 05/20/2013    Procedure: LAPAROSCOPIC LOW ANTERIOR RESECTION WITH SPLENIC FLEXURE MOBILIZATION;  Surgeon: Leighton Ruff, MD;  Location: [facility];  Service: General;  Laterality: N/A;  . Ostomy N/A 05/20/2013    Procedure: diverting OSTOMY;  Surgeon: Leighton Ruff, MD;  Location: [facility];  Service: General;  Laterality: N/A;    Pryor Ochoa RD, LDN Inpatient Clinical Dietitian Pager: [phone] After Hours Pager: [phone]

## 2013-05-27 NOTE — Clinical Documentation Improvement (Signed)
Clarification #1 (Please note there are 2 Queries on that need to be addressed)  Xray of ABD notes the following supicious for a partial small bowel obstruction.  Clarification Needed   Please clarify if you agree pt with a partial small bowel obstruction and document in pn or d/c summary    Possible Clinical Conditions? ____________________  ____________________ ____________________ _______Other Condition__________________ _______Cannot Clinically Determine   Supporting Information: Risk Factors: Rectal CA ABLA Protein cal malnutrition HTN Urinary retentiion  Signs & Symptoms: Diagnostics:  Xray ABD 05/26/13  FINDINGS:  There is gaseous distention of small bowel loops with air-fluid levels on the left lateral decubitus film suspicious for a partial small bowel obstruction.  Treatment Monitoring _______________________________________________________________________________________________ Clarification #2   Pt w/ Chronic renal insufficiency stage II  Clarification Needed  Please clarify if chronic renal insufficiency stage II can be further specified as one of the diagnoses listed below and document in pn or d/c summary.  Possible Clinical Conditions?  _______CKD Stage I - GFR > OR = 90  _______CKD Stage II - GFR 60-80  _______CKD Stage III - GFR 30-59  _______CKD Stage IV - GFR 15-29  _______CKD Stage V - GFR < 15  _______ESRD (End Stage Renal Disease)  _______Other condition_____________  _______Cannot Clinically determine   Supporting Information:  Risk Factors:   Chronic Renal insufficiency   Rectal ca   HTN   ABLA   Treatment:  Monitoring     Thank You, Wellington Hampshire, BSN, MSN/Inf, CCDS Clinical Documentation Specialist [facility] Dept Pager: [phone] / E-mail: [email or web address]  [place] Information Management

## 2013-05-27 NOTE — Progress Notes (Signed)
7 Days Post-Op Lap assisted LAR with coloanal anastomosis and diverting loop ileostomy Subjective: Pt with no nausea.  Ambulating.  No complaints.  Having small amt's of emesis when coughing.  Vitals stable.    Objective: Vital signs in last 24 hours: Temp:  [98.2 F (36.8 C)-98.7 F (37.1 C)] 98.3 F (36.8 C) (11/21 0554) Pulse Rate:  [88-95] 95 (11/21 0554) Resp:  [18] 18 (11/21 0554) BP: (118-148)/(56-78) 145/78 mmHg (11/21 0554) SpO2:  [98 %-100 %] 100 % (11/21 0554)   Intake/Output from previous day: 2023-06-26 0701 - 11/21 0700 In: 975 [I.V.:975] Out: 1775 [Urine:1350; Drains:425] Intake/Output this shift:   General appearance: alert and cooperative Resp: no respiratory distress GI: normal findings: soft, non-tender less distended JP: serosang discharge Incision: no significant drainage Ostomy: beefy red, viable, no real stool or air output  Lab Results:   Recent Labs  05/25/13 0435 06/25/2013 0444  WBC 12.0* 10.3  HGB 7.5* 7.8*  HCT 22.5* 23.7*  PLT 217 230   BMET  Recent Labs  05/25/13 0435 25-Jun-2013 0444  NA 141 144  K 3.6 3.6  CL 106 107  CO2 27 27  GLUCOSE 120* 125*  BUN 24* 23  CREATININE 2.02* 1.90*  CALCIUM 9.5 9.3   PT/INR  Recent Labs  06/25/2013 0444  LABPROT 14.6  INR 1.16   ABG No results found for this basename: PHART, PCO2, PO2, HCO3,  in the last 72 hours  MEDS, Scheduled . allopurinol  300 mg Oral q morning - 10a  . amLODipine  10 mg Oral q morning - 10a  . atenolol  50 mg Oral Daily  . chlorthalidone  25 mg Oral Daily  . doxazosin  8 mg Oral QHS  . heparin subcutaneous  5,000 Units Subcutaneous Q8H    Studies/Results: Dg Abd 2 Views  2013/06/25   CLINICAL DATA:  Distended abdomen, history of rectal carcinoma  EXAM: ABDOMEN - 2 VIEW  COMPARISON:  CT abdomen pelvis of 04/25/2013  FINDINGS: There is gaseous distention of small bowel loops with air-fluid levels on the left lateral decubitus film suspicious for a partial small  bowel obstruction. Also, on the left lateral decubitus film there is lucency within the right upper abdomen suspicious for free intraperitoneal air. However, there is a catheter overlying the pelvis which may be the cause of the intraperitoneal air. Clinical correlation is recommended. If perforated viscus is suspected clinically then CT of the abdomen pelvis would be recommended.  IMPRESSION: 1. Free intraperitoneal air. This free air could be due to a catheter overlying the pelvis, and clinical correlation is recommended. Perforated viscus cannot be excluded and clinical correlation is recommended. 2. Slightly dilated small bowel suspicious for partial small bowel obstruction. 3. This report has been made a call report.   Electronically Signed   By: [name] M.D.   On: 06-25-13 09:36    Assessment: s/p Procedure(s): LAPAROSCOPIC LOW ANTERIOR RESECTION WITH SPLENIC FLEXURE MOBILIZATION diverting OSTOMY Patient Active Problem List   Diagnosis Date Noted  . Rectal cancer 02/01/2013  . Anemia 07/20/2012  . Chronic renal insufficiency 07/31/2011  . EDEMA 09/21/2010  . OBSTRUCTIVE SLEEP APNEA 07/29/2010  . GOUT 05/11/2007  . ERECTILE DYSFUNCTION, MILD 05/11/2007  . HYPERTENSION 05/11/2007  . COLONIC POLYPS, HX OF 05/11/2007     Plan: pt appears to have a post-op ileus, AXR shows several loops of dilated small bowel.  Will get a CT to evaluate for a cause.  May need NG decompression.  Cont MIV at 75ml/h, ok for sips of clears Replace foley, unable to void after 2 attempts and straight caths.  Will leave foley in place due to acute urinary retention.  Will have him follow up with urology in a couple weeks.  Acute blood loss anemia, chronic anemia: s/p 1 unit pRBC's.  Will monitor for signs and symptoms for need for additional transfusions.  Hgb and HR stable currently Acute protein calorie malnutrition: Alb 2.4, prealb 9.8. Will start TPN, as I do not anticipate much bowel function to  return over the next couple days.  PICC in place per PICC RN.      LOS: 7 days     .Rosario Adie, MD [facility], [place]   05/27/2013 7:36 AM

## 2013-05-27 NOTE — Clinical Documentation Improvement (Signed)
Clarification #1 (Please note there are 2 Queries on that need to be addressed)  Xray of ABD notes the following supicious for a partial small bowel obstruction.  Clarification Needed   Please clarify if you agree pt with a partial small bowel obstruction and document in pn or d/c summary    Possible Clinical Conditions? ____________________  ____________________ ____________________ _______Other Condition__________________ _______Cannot Clinically Determine   Supporting Information: Risk Factors: Rectal CA ABLA Protein cal malnutrition HTN Urinary retentiion  Signs & Symptoms: Diagnostics:  Xray ABD 05/26/13  FINDINGS:  There is gaseous distention of small bowel loops with air-fluid levels on the left lateral decubitus film suspicious for a partial small bowel obstruction.  Treatment Monitoring _______________________________________________________________________________________________ Clarification #2   Pt w/ Chronic renal insufficiency stage II  Clarification Needed  Please clarify if chronic renal insufficiency stage II can be further specified as one of the diagnoses listed below and document in pn or d/c summary.  Possible Clinical Conditions?  _______CKD Stage I - GFR > OR = 90  _______CKD Stage II - GFR 60-80  _______CKD Stage III - GFR 30-59  _______CKD Stage IV - GFR 15-29  _______CKD Stage V - GFR < 15  _______ESRD (End Stage Renal Disease)  _______Other condition_____________  _______Cannot Clinically determine   Supporting Information:  Risk Factors:   Chronic Renal insufficiency   Rectal ca   HTN   ABLA   Treatment:  Monitoring     Thank You, Wellington Hampshire, BSN, MSN/Inf, CCDS Clinical Documentation Specialist [facility] Dept Pager: [phone] / E-mail: [email or web address]  [place] Information Management

## 2013-05-27 NOTE — Progress Notes (Signed)
In and out cath'd per MD order under care order instruction.

## 2013-05-27 NOTE — Progress Notes (Signed)
Physical Therapy Treatment Patient Details Name: Jason [name] MRN: [id number] DOB: [birth year] Today's Date: 05/27/2013 Time: 1410-1435 PT Time Calculation (min): 25 min  PT Assessment / Plan / Recommendation  History of Present Illness Patient is a 67 y/o male admitted for colectomy with diverting colostomy due to rectal cancer.   PT Comments   Assisted pt OOB to amb in hallway twice with one sitting rest break.  No c/o pain or nausea.  Toerated session well.   Follow Up Recommendations  Home health PT     Does the patient have the potential to tolerate intense rehabilitation     Barriers to Discharge        Equipment Recommendations       Recommendations for Other Services    Frequency Min 3X/week   Progress towards PT Goals Progress towards PT goals: Progressing toward goals  Plan      Precautions / Restrictions Precautions Precautions: Fall Precaution Comments: NPO Restrictions Weight Bearing Restrictions: No    Pertinent Vitals/Pain No c/o    Mobility  Bed Mobility Bed Mobility: Supine to Sit Supine to Sit: 5: Supervision Details for Bed Mobility Assistance: increased time Transfers Transfers: Sit to Stand;Stand to Sit Sit to Stand: 4: Min guard;From bed;With upper extremity assist;5: Supervision Stand to Sit: To chair/3-in-1;With upper extremity assist;4: Min guard;With armrests;5: Supervision Details for Transfer Assistance: increased time Ambulation/Gait Ambulation/Gait Assistance: 4: Min guard Ambulation Distance (Feet): 225 Feet Assistive device: Rolling walker Ambulation/Gait Assistance Details: increase d time      PT Goals (current goals can now be found in the care plan section)    Visit Information  Last PT Received On: 05/27/13 Assistance Needed: +1 History of Present Illness: Patient is a 67 y/o male admitted for colectomy with diverting colostomy due to rectal cancer.    Subjective Data      Cognition       Balance     End of  Session PT - End of Session Equipment Utilized During Treatment: Gait belt Activity Tolerance: Patient tolerated treatment well   [name]  PTA [facility]  Acute  Rehab Pager      [phone]

## 2013-05-27 NOTE — Progress Notes (Signed)
PARENTERAL NUTRITION CONSULT NOTE - INITIAL  Pharmacy Consult for TNA Indication: Post-op Ileus  Allergies  Allergen Reactions  . Nsaids     Asked by surgeon to add this medication class as intolerance due to patients renal insufficiency.    Patient Measurements: Height: 5\' 7"  (170.2 cm) Weight: 199 lb (90.266 kg) IBW/kg (Calculated) : 66.1 Adjusted Body Weight: 73 kg Usual Weight:   Vital Signs: Temp: 98.3 F (36.8 C) (11/21 0554) Temp src: Oral (11/21 0554) BP: 145/78 mmHg (11/21 0554) Pulse Rate: 95 (11/21 0554) Intake/Output from previous day: 11/20 0701 - 11/21 0700 In: 975 [I.V.:975] Out: 1775 [Urine:1350; Drains:425] Intake/Output from this shift:    Labs:  Recent Labs  05/25/13 0435 05/26/13 0444  WBC 12.0* 10.3  HGB 7.5* 7.8*  HCT 22.5* 23.7*  PLT 217 230  INR  --  1.16     Recent Labs  05/25/13 0435 05/26/13 0444  NA 141 144  K 3.6 3.6  CL 106 107  CO2 27 27  GLUCOSE 120* 125*  BUN 24* 23  CREATININE 2.02* 1.90*  CALCIUM 9.5 9.3  MG 1.8 1.8  PHOS 2.7  --   PROT  --  5.8*  ALBUMIN  --  2.4*  AST  --  57*  ALT  --  40  ALKPHOS  --  85  BILITOT  --  0.3  PREALBUMIN  --  9.8*   Estimated Creatinine Clearance: 41 ml/min (by C-G formula based on Cr of 1.9).   No results found for this basename: GLUCAP,  in the last 72 hours  Medical History: Past Medical History  Diagnosis Date  . Gout   . Hypertension   . Hyperplastic colon polyp   . Diverticulosis   . Anemia   . Arthritis   . ED (erectile dysfunction)   . Chronic renal insufficiency, stage II (mild)   . rectal ca dx'd 02/01/13    rectal  . History of radiation therapy 02/21/13-03/31/13    rectum 50.4Gy total dose    Insulin Requirements in the past 24 hours: none  Nutritional Goals:  RD recs: pending Estimated goals: 1970 kcal/day and 99g/day protein Clinimix E 5/15 at a goal rate of 32ml/hr + 20% fat emulsion at 16ml/hr to provide: 100g/day protein, 1894  Kcal/day.  Current Nutrition: NPO except for sips with meds  IVF: D5-1/2NS with KCl 20 mEq at 75 ml/hr  Assessment:  62 yoM with rectal cancer s/p lap assisted LAR with coloanal anastomosis and diverting loop ileostomy, now with post-op ileus.  TNA ordered to start 11/21 since surgery does not anticipate bowel function to return over next couple of days and patient has acute protein calorie malnutrition.   Glucose - no CBGs to review, pt has no hx of DM, will begin following CBGs while patient on TNA  Electrolytes - K+/Mag/Phos WNL  LFTs - AST mildly elevated  Renal - SCr 1.90 and improving  TGs - ordered for AM  Prealbumin - 9.8 (11/20)   TPN Access: PICC placed 11/20 TPN day#: 1  Plan: At 1800 today:  Start Clinimix E 5/15 at 40 ml/hr.  20% fat emulsion at 10 ml/hr.  Plan to advance as tolerated to the goal rate.  TNA to contain standard multivitamins and trace elements.  Reduce IVF to 35 ml/hr to account for volume provided by TNA.  Start CBG checks and SSI sensitive scale q6h.  TNA lab panels on Mondays & Thursdays.  F/u RD recommendations for nutrition goals.  Hershal Coria 05/27/2013,8:02 AM

## 2013-05-27 NOTE — Progress Notes (Signed)
Pt was unable to void since last I/O at [place] on 11/20. Asked patient if he needed to urinate and he said he did not have any urgency to urinate. I/O cathed and got 240ml back. Will pass along to day nurse.

## 2013-05-28 DIAGNOSIS — E46 Unspecified protein-calorie malnutrition: Secondary | ICD-10-CM

## 2013-05-28 LAB — DIFFERENTIAL
Basophils Absolute: 0 10*3/uL (ref 0.0–0.1)
Basophils Relative: 0 % (ref 0–1)
Eosinophils Relative: 1 % (ref 0–5)
Monocytes Absolute: 1.4 10*3/uL — ABNORMAL HIGH (ref 0.1–1.0)
Monocytes Relative: 15 % — ABNORMAL HIGH (ref 3–12)
Neutro Abs: 7.8 10*3/uL — ABNORMAL HIGH (ref 1.7–7.7)

## 2013-05-28 LAB — COMPREHENSIVE METABOLIC PANEL
ALT: 72 U/L — ABNORMAL HIGH (ref 0–53)
Alkaline Phosphatase: 149 U/L — ABNORMAL HIGH (ref 39–117)
CO2: 31 mEq/L (ref 19–32)
Chloride: 102 mEq/L (ref 96–112)
GFR calc Af Amer: 35 mL/min — ABNORMAL LOW (ref >90)
GFR calc non Af Amer: 30 mL/min — ABNORMAL LOW (ref >90)
Glucose, Bld: 136 mg/dL — ABNORMAL HIGH (ref 70–99)
Potassium: 3.3 mEq/L — ABNORMAL LOW (ref 3.5–5.1)
Sodium: 142 mEq/L (ref 135–145)
Total Bilirubin: 0.3 mg/dL (ref 0.3–1.2)

## 2013-05-28 LAB — CBC
HCT: 24.3 % — ABNORMAL LOW (ref 39.0–52.0)
Hemoglobin: 7.8 g/dL — ABNORMAL LOW (ref 13.0–17.0)
MCH: 28.5 pg (ref 26.0–34.0)
MCHC: 32.1 g/dL (ref 30.0–36.0)
Platelets: 287 10*3/uL (ref 150–400)
RDW: 16.6 % — ABNORMAL HIGH (ref 11.5–15.5)

## 2013-05-28 LAB — MAGNESIUM: Magnesium: 1.9 mg/dL (ref 1.5–2.5)

## 2013-05-28 LAB — GLUCOSE, CAPILLARY
Glucose-Capillary: 130 mg/dL — ABNORMAL HIGH (ref 70–99)
Glucose-Capillary: 132 mg/dL — ABNORMAL HIGH (ref 70–99)
Glucose-Capillary: 125 mg/dL — ABNORMAL HIGH (ref 70–99)

## 2013-05-28 LAB — TRIGLYCERIDES: Triglycerides: 225 mg/dL — ABNORMAL HIGH (ref <150)

## 2013-05-28 MED ORDER — TRACE MINERALS CR-CU-F-FE-I-MN-MO-SE-ZN IV SOLN
INTRAVENOUS | Status: AC
  Administered 2013-05-28: 18:00:00 via INTRAVENOUS
  Filled 2013-05-28: qty 2000

## 2013-05-28 MED ORDER — POTASSIUM CHLORIDE 10 MEQ/50ML IV SOLN
10.0000 meq | INTRAVENOUS | Status: DC
  Filled 2013-05-28 (×3): qty 50

## 2013-05-28 MED ORDER — FAT EMULSION 20 % IV EMUL
240.0000 mL | INTRAVENOUS | Status: AC
  Administered 2013-05-28: 18:00:00 240 mL via INTRAVENOUS
  Filled 2013-05-28: qty 250

## 2013-05-28 MED ORDER — KCL IN DEXTROSE-NACL 20-5-0.45 MEQ/L-%-% IV SOLN
INTRAVENOUS | Status: DC
  Administered 2013-05-31: 08:00:00 via INTRAVENOUS
  Filled 2013-05-28 (×4): qty 1000

## 2013-05-28 MED ORDER — POTASSIUM CHLORIDE 10 MEQ/100ML IV SOLN
10.0000 meq | INTRAVENOUS | Status: AC
  Administered 2013-05-28 (×4): 10 meq via INTRAVENOUS
  Filled 2013-05-28 (×4): qty 100

## 2013-05-28 NOTE — Progress Notes (Signed)
PARENTERAL NUTRITION CONSULT NOTE - FOLLOW UP  Pharmacy Consult for TNA Indication: Post-op Ileus  Allergies  Allergen Reactions  . Nsaids     Asked by surgeon to add this medication class as intolerance due to patients renal insufficiency.    Patient Measurements: Height: 5\' 7"  (170.2 cm) Weight: 199 lb (90.266 kg) IBW/kg (Calculated) : 66.1 Adjusted Body Weight: 73kg Usual Weight: 104kg  Vital Signs: Temp: 98.6 F (37 C) (11/22 0445) Temp src: Oral (11/22 0445) BP: 121/63 mmHg (11/22 0445) Pulse Rate: 97 (11/22 0445) Intake/Output from previous day: 11/21 0701 - 11/22 0700 In: 715 [I.V.:685; NG/GT:30] Out: 2941 [Urine:615; Emesis/NG output:2100; Drains:225; Blood:1] Intake/Output from this shift: Total I/O In: 30 [NG/GT:30] Out: 1175 [Urine:275; Emesis/NG output:900]  Labs:  Recent Labs  05/26/13 0444 05/28/13 0445  WBC 10.3 9.6  HGB 7.8* 7.8*  HCT 23.7* 24.3*  PLT 230 287  INR 1.16  --      Recent Labs  05/26/13 0444 05/27/13 0933 05/28/13 0445  NA 144 142 142  K 3.6 4.0 3.3*  CL 107 106 102  CO2 27 29 31   GLUCOSE 125* 145* 136*  BUN 23 26* 32*  CREATININE 1.90* 2.03* 2.17*  CALCIUM 9.3 9.4 9.5  MG 1.8 1.7 1.9  PHOS  --  3.3 3.6  PROT 5.8*  --  5.7*  ALBUMIN 2.4*  --  2.4*  AST 57*  --  64*  ALT 40  --  72*  ALKPHOS 85  --  149*  BILITOT 0.3  --  0.3  PREALBUMIN 9.8*  --   --    Estimated Creatinine Clearance: 35.9 ml/min (by C-G formula based on Cr of 2.17).    Recent Labs  05/27/13 1723 05/28/13 0025 05/28/13 0617  GLUCAP 118* 137* 130*    Medications:  Scheduled:  . allopurinol  300 mg Oral q morning - 10a  . amLODipine  10 mg Oral q morning - 10a  . atenolol  50 mg Oral Daily  . chlorthalidone  25 mg Oral Daily  . doxazosin  8 mg Oral QHS  . heparin subcutaneous  5,000 Units Subcutaneous Q8H  . insulin aspart  0-9 Units Subcutaneous Q6H   Infusions:  . dextrose 5 % and 0.45 % NaCl with KCl 20 mEq/L 35 mL/hr at 05/27/13  1800  . Marland KitchenTPN (CLINIMIX-E) Adult 40 mL/hr at 05/27/13 1756   And  . fat emulsion 240 mL (05/27/13 1756)   Insulin Requirements in the past 24 hours:  3 units SSI required - sensitive scale q4h  Nutritional Goals:  RD recs: Kcal: 1800- 2000  Protein: 100-110 grams  Fluid: 2.3-2.5 L/day  Clinimix E 5/15 at a goal rate of 61ml/hr + 20% fat emulsion at 55ml/hr to provide: 100g/day protein, 1894 Kcal/day.  Current Nutrition:  NPO except sips with meds Clinimix E5/15 at 67ml/hr + 20 Lipids 8ml/hr  IVF: D5 1/2 NS at 35 ml/hr  Assessment: 55 yoM with rectal cancer s/p lap assisted LAR with coloanal anastomosis and diverting loop ileostomy, now with post-op ileus. TNA started 11/21 since surgery does not anticipate bowel function to return over next couple of days and patient has acute protein calorie malnutrition.   Glucose - at goal < 150  Electrolytes - K+ low, all other WNL  Renal function: CKD, SCr elevated and rising  LFTs - elevated, AST/ALT = 64/72, AlkPhos = 149 - monitor closely  TGs - pending  Prealbumin - low, 9.8 (11/20) pending  TPN Access: Double  lumen PICC placed 11/20 TPN day#: 2  Plan: At 1800 today:  Advance Clinimix E5/15 to 50 ml/hr.  20% fat emulsion at 67ml/hr.  KCl 50meq IV x 3 runs  Plan to advance as tolerated to the goal rate.  TNA to contain standard multivitamins and trace elements.  Reduce IVF to 56ml/hr.  Continue SSI sensitive scale q4h .   TNA lab panels on Mondays & Thursdays.  F/u daily.   Peggyann Juba, PharmD, BCPS Pager: [phone] 05/28/2013,7:32 AM

## 2013-05-28 NOTE — Progress Notes (Addendum)
Patient ID: CARDERO QUIZHPI, male   DOB: 1945-08-10, 67 y.o.   MRN: [id number] 8 Days Post-Op Lap assisted LAR with coloanal anastomosis and diverting loop ileostomy Subjective: Pt with no nausea.  Ambulating.  NGT placed yesterday with 2L output.  Pain OK  Objective: Vital signs in last 24 hours: Temp:  [97.4 F (36.3 C)-98.6 F (37 C)] 98.6 F (37 C) (11/22 0445) Pulse Rate:  [97-102] 97 (11/22 0445) Resp:  [16-20] 16 (11/22 0445) BP: (112-132)/(63-73) 121/63 mmHg (11/22 0445) SpO2:  [99 %-100 %] 99 % (11/22 0445)   Intake/Output from previous day: 11/21 0701 - 11/22 0700 In: 715 [I.V.:685; NG/GT:30] Out: 2941 [Urine:615; Emesis/NG output:2100; Drains:225; Blood:1] Intake/Output this shift: Total I/O In: 30 [NG/GT:30] Out: 1175 [Urine:275; Emesis/NG output:900] General appearance: alert and cooperative Resp: no respiratory distress GI: distended, fairly tight.   JP: serosang discharge Incision: no significant drainage Ostomy: beefy red, viable, no real stool or air output  Lab Results:   Recent Labs  05/26/13 0444 05/28/13 0445  WBC 10.3 9.6  HGB 7.8* 7.8*  HCT 23.7* 24.3*  PLT 230 287   BMET  Recent Labs  05/27/13 0933 05/28/13 0445  NA 142 142  K 4.0 3.3*  CL 106 102  CO2 29 31  GLUCOSE 145* 136*  BUN 26* 32*  CREATININE 2.03* 2.17*  CALCIUM 9.4 9.5   PT/INR  Recent Labs  05/26/13 0444  LABPROT 14.6  INR 1.16   ABG No results found for this basename: PHART, PCO2, PO2, HCO3,  in the last 72 hours  MEDS, Scheduled . allopurinol  300 mg Oral q morning - 10a  . amLODipine  10 mg Oral q morning - 10a  . atenolol  50 mg Oral Daily  . chlorthalidone  25 mg Oral Daily  . doxazosin  8 mg Oral QHS  . heparin subcutaneous  5,000 Units Subcutaneous Q8H  . insulin aspart  0-9 Units Subcutaneous Q6H  . potassium chloride  10 mEq Intravenous Q1 Hr x 4  . potassium chloride  10 mEq Intravenous Q1 Hr x 3    Studies/Results: Ct Abdomen Pelvis Wo  Contrast  05/27/2013   CLINICAL DATA:  Postoperative ileus.  EXAM: CT ABDOMEN AND PELVIS WITHOUT CONTRAST  TECHNIQUE: Multidetector CT imaging of the abdomen and pelvis was performed following the standard protocol without intravenous contrast.  COMPARISON:  CT scan of April 25, 2013.  FINDINGS: Mild bilateral pleural effusions are noted which are increased compared to prior exam. Stable 10 mm nodule is noted in right middle lobe.  These unenhanced images demonstrate no abnormality involving the liver, spleen or pancreas. Adrenal glands appear normal. No hydronephrosis is noted. Stable probable hyperdense cyst seen in right kidney. Stable probable hyperdense cyst seen medially in left kidney. Small amount of fluid is noted around the left kidney. Gastric distention is again noted and appears to be worse compared to prior exam. There is interval development of severe small mild dilatation, but the colon is decompressed. Surgical drain is seen entering the anterior abdominal wall and extending to the posterior pelvis. Ileostomy seen in right lower quadrant of abdominal wall. No abnormal fluid collection is seen. Urinary bladder appears normal. Small amount of soft tissue gas is seen in the anterior pelvic wall most likely postoperative in nature. Rectal mass noted on prior exam is not visualized currently. No definite adenopathy is noted. The appendix is normal.  IMPRESSION: Status post ileostomy placement in right lower quadrant of anterior abdominal wall.  Surgical drain remains with tip in posterior pelvis. There is interval development of severe small bowel dilatation which is concerning for distal small bowel obstruction or less likely ileus. No colonic dilatation is noted.  Interval development of mild bilateral pleural effusions. Stable 10 mm nodule seen in right lung base.  Stable probable bilateral hyperdense cysts compared to prior exam.  Rectal mass identified on prior exam is no longer visualized and  presumably has been surgically removed.   Electronically Signed   By: Sabino Dick M.D.   On: 05/27/2013 14:05   Dg Abd 2 Views  05/26/2013   CLINICAL DATA:  Distended abdomen, history of rectal carcinoma  EXAM: ABDOMEN - 2 VIEW  COMPARISON:  CT abdomen pelvis of 04/25/2013  FINDINGS: There is gaseous distention of small bowel loops with air-fluid levels on the left lateral decubitus film suspicious for a partial small bowel obstruction. Also, on the left lateral decubitus film there is lucency within the right upper abdomen suspicious for free intraperitoneal air. However, there is a catheter overlying the pelvis which may be the cause of the intraperitoneal air. Clinical correlation is recommended. If perforated viscus is suspected clinically then CT of the abdomen pelvis would be recommended.  IMPRESSION: 1. Free intraperitoneal air. This free air could be due to a catheter overlying the pelvis, and clinical correlation is recommended. Perforated viscus cannot be excluded and clinical correlation is recommended. 2. Slightly dilated small bowel suspicious for partial small bowel obstruction. 3. This report has been made a call report.   Electronically Signed   By: [name] M.D.   On: 05/26/2013 09:36    Assessment: s/p Procedure(s): LAPAROSCOPIC LOW ANTERIOR RESECTION WITH SPLENIC FLEXURE MOBILIZATION diverting OSTOMY Patient Active Problem List   Diagnosis Date Noted  . Rectal cancer 02/01/2013    Priority: High  . Anemia 07/20/2012  . Chronic renal insufficiency 07/31/2011  . EDEMA 09/21/2010  . OBSTRUCTIVE SLEEP APNEA 07/29/2010  . GOUT 05/11/2007  . ERECTILE DYSFUNCTION, MILD 05/11/2007  . HYPERTENSION 05/11/2007  . COLONIC POLYPS, HX OF 05/11/2007     Plan: Ileus.  Keep NGT  Keep foley for repeated urinary retention Acute protein calorie malnutrition: Alb 2.4, prealb 9.8. TNA Ostomy care.   Already on alpha blocker   LOS: 8 days     05/28/2013 7:43 AM

## 2013-05-28 NOTE — Progress Notes (Signed)
Patient ambulated the entire hall using walker. Patient tolerated well.

## 2013-05-29 DIAGNOSIS — E876 Hypokalemia: Secondary | ICD-10-CM

## 2013-05-29 LAB — COMPREHENSIVE METABOLIC PANEL
Albumin: 2.4 g/dL — ABNORMAL LOW (ref 3.5–5.2)
BUN: 31 mg/dL — ABNORMAL HIGH (ref 6–23)
Creatinine, Ser: 2.12 mg/dL — ABNORMAL HIGH (ref 0.50–1.35)
GFR calc Af Amer: 36 mL/min — ABNORMAL LOW (ref >90)
GFR calc non Af Amer: 31 mL/min — ABNORMAL LOW (ref >90)
Glucose, Bld: 134 mg/dL — ABNORMAL HIGH (ref 70–99)
Potassium: 3.1 mEq/L — ABNORMAL LOW (ref 3.5–5.1)
Total Protein: 5.8 g/dL — ABNORMAL LOW (ref 6.0–8.3)

## 2013-05-29 LAB — GLUCOSE, CAPILLARY
Glucose-Capillary: 123 mg/dL — ABNORMAL HIGH (ref 70–99)
Glucose-Capillary: 126 mg/dL — ABNORMAL HIGH (ref 70–99)
Glucose-Capillary: 135 mg/dL — ABNORMAL HIGH (ref 70–99)

## 2013-05-29 MED ORDER — POTASSIUM CHLORIDE 10 MEQ/100ML IV SOLN
10.0000 meq | INTRAVENOUS | Status: AC
  Administered 2013-05-29 (×6): 10 meq via INTRAVENOUS
  Filled 2013-05-29 (×6): qty 100

## 2013-05-29 MED ORDER — TRACE MINERALS CR-CU-F-FE-I-MN-MO-SE-ZN IV SOLN
INTRAVENOUS | Status: AC
  Administered 2013-05-29: 18:00:00 via INTRAVENOUS
  Filled 2013-05-29: qty 2000

## 2013-05-29 MED ORDER — POTASSIUM CHLORIDE 10 MEQ/100ML IV SOLN
10.0000 meq | INTRAVENOUS | Status: DC
  Filled 2013-05-29 (×4): qty 100

## 2013-05-29 MED ORDER — FAT EMULSION 20 % IV EMUL
240.0000 mL | INTRAVENOUS | Status: AC
  Administered 2013-05-29: 240 mL via INTRAVENOUS
  Filled 2013-05-29: qty 250

## 2013-05-29 NOTE — Progress Notes (Signed)
Patient ID: Jason [name], male   DOB: [birth year], 67 y.o.   MRN: [id number] 9 Days Post-Op Lap assisted LAR with coloanal anastomosis and diverting loop ileostomy Subjective: 5800 NGT output yesterday.  Was taking some ice chips, but <800 ml ice chips.  Pt sleepy this AM, but no complaints.    Objective: Vital signs in last 24 hours: Temp:  [98.1 F (36.7 C)-99.6 F (37.6 C)] 99.6 F (37.6 C) (11/23 0620) Pulse Rate:  [97-100] 99 (11/23 0620) Resp:  [20] 20 (11/23 0620) BP: (121-122)/(66-74) 122/66 mmHg (11/23 0620) SpO2:  [98 %-99 %] 98 % (11/23 0620)   Intake/Output from previous day: 11/22 0701 - 11/23 0700 In: 830 [I.V.:400; NG/GT:30; IV Piggyback:400] Out: 7610 [Urine:1625; Emesis/NG output:5850; Drains:135] Intake/Output this shift:   General appearance: sleeping, but arousable.   Resp: no respiratory distress GI: much softer, markedly less distended.   JP: serosang discharge Incision: no significant drainage Ostomy: beefy red, viable, no real stool or air output  Lab Results:   Recent Labs  05/28/13 0445  WBC 9.6  HGB 7.8*  HCT 24.3*  PLT 287   BMET  Recent Labs  05/28/13 0445 05/29/13 0500  NA 142 144  K 3.3* 3.1*  CL 102 99  CO2 31 38*  GLUCOSE 136* 134*  BUN 32* 31*  CREATININE 2.17* 2.12*  CALCIUM 9.5 9.5   PT/INR No results found for this basename: LABPROT, INR,  in the last 72 hours ABG No results found for this basename: PHART, PCO2, PO2, HCO3,  in the last 72 hours  MEDS, Scheduled . allopurinol  300 mg Oral q morning - 10a  . amLODipine  10 mg Oral q morning - 10a  . atenolol  50 mg Oral Daily  . chlorthalidone  25 mg Oral Daily  . doxazosin  8 mg Oral QHS  . heparin subcutaneous  5,000 Units Subcutaneous Q8H  . insulin aspart  0-9 Units Subcutaneous Q6H  . potassium chloride  10 mEq Intravenous Q1 Hr x 6    Studies/Results: Ct Abdomen Pelvis Wo Contrast  05/27/2013   CLINICAL DATA:  Postoperative ileus.  EXAM: CT ABDOMEN AND  PELVIS WITHOUT CONTRAST  TECHNIQUE: Multidetector CT imaging of the abdomen and pelvis was performed following the standard protocol without intravenous contrast.  COMPARISON:  CT scan of April 25, 2013.  FINDINGS: Mild bilateral pleural effusions are noted which are increased compared to prior exam. Stable 10 mm nodule is noted in right middle lobe.  These unenhanced images demonstrate no abnormality involving the liver, spleen or pancreas. Adrenal glands appear normal. No hydronephrosis is noted. Stable probable hyperdense cyst seen in right kidney. Stable probable hyperdense cyst seen medially in left kidney. Small amount of fluid is noted around the left kidney. Gastric distention is again noted and appears to be worse compared to prior exam. There is interval development of severe small mild dilatation, but the colon is decompressed. Surgical drain is seen entering the anterior abdominal wall and extending to the posterior pelvis. Ileostomy seen in right lower quadrant of abdominal wall. No abnormal fluid collection is seen. Urinary bladder appears normal. Small amount of soft tissue gas is seen in the anterior pelvic wall most likely postoperative in nature. Rectal mass noted on prior exam is not visualized currently. No definite adenopathy is noted. The appendix is normal.  IMPRESSION: Status post ileostomy placement in right lower quadrant of anterior abdominal wall. Surgical drain remains with tip in posterior pelvis. There is interval development  of severe small bowel dilatation which is concerning for distal small bowel obstruction or less likely ileus. No colonic dilatation is noted.  Interval development of mild bilateral pleural effusions. Stable 10 mm nodule seen in right lung base.  Stable probable bilateral hyperdense cysts compared to prior exam.  Rectal mass identified on prior exam is no longer visualized and presumably has been surgically removed.   Electronically Signed   By: Sabino Dick  M.D.   On: 05/27/2013 14:05    Assessment: s/p Procedure(s): LAPAROSCOPIC LOW ANTERIOR RESECTION WITH SPLENIC FLEXURE MOBILIZATION diverting OSTOMY Patient Active Problem List   Diagnosis Date Noted  . Rectal cancer 02/01/2013    Priority: High  . Anemia 07/20/2012  . Chronic renal insufficiency 07/31/2011  . EDEMA 09/21/2010  . OBSTRUCTIVE SLEEP APNEA 07/29/2010  . GOUT 05/11/2007  . ERECTILE DYSFUNCTION, MILD 05/11/2007  . HYPERTENSION 05/11/2007  . COLONIC POLYPS, HX OF 05/11/2007     Plan: Ileus.  Keep NGT  Keep foley for repeated urinary retention Acute protein calorie malnutrition: Alb 2.4, prealb 9.8. TNA Ostomy care.   Already on alpha blocker Hypokalemia - replete.    Recheck labs in AM   LOS: 9 days     05/29/2013 7:57 AM

## 2013-05-29 NOTE — Progress Notes (Signed)
Pt had a 1.21 pause at 0625 on 11/23. Pt asymtomatic.BP stable. NP on call notified. No new orders. Will continue to monitor. Will report to day nurse for observation.

## 2013-05-29 NOTE — Progress Notes (Signed)
PARENTERAL NUTRITION CONSULT NOTE - FOLLOW UP  Pharmacy Consult for TNA Indication: Post-op Ileus  Allergies  Allergen Reactions  . Nsaids     Asked by surgeon to add this medication class as intolerance due to patients renal insufficiency.    Patient Measurements: Height: 5\' 7"  (170.2 cm) Weight: 199 lb (90.266 kg) IBW/kg (Calculated) : 66.1 Adjusted Body Weight: 73kg Usual Weight: 104kg  Vital Signs: Temp: 99.6 F (37.6 C) (11/23 0620) Temp src: Oral (11/23 0620) BP: 122/66 mmHg (11/23 0620) Pulse Rate: 99 (11/23 0620) Intake/Output from previous day: 11/22 0701 - 11/23 0700 In: 830 [I.V.:400; NG/GT:30; IV Piggyback:400] Out: 7610 [Urine:1625; Emesis/NG output:5850; Drains:135] Intake/Output from this shift:    Labs:  Recent Labs  05/28/13 0445  WBC 9.6  HGB 7.8*  HCT 24.3*  PLT 287     Recent Labs  05/27/13 0933 05/28/13 0445 05/29/13 0500  NA 142 142 144  K 4.0 3.3* 3.1*  CL 106 102 99  CO2 29 31 38*  GLUCOSE 145* 136* 134*  BUN 26* 32* 31*  CREATININE 2.03* 2.17* 2.12*  CALCIUM 9.4 9.5 9.5  MG 1.7 1.9  --   PHOS 3.3 3.6  --   PROT  --  5.7* 5.8*  ALBUMIN  --  2.4* 2.4*  AST  --  64* 25  ALT  --  72* 49  ALKPHOS  --  149* 143*  BILITOT  --  0.3 0.3  PREALBUMIN  --  11.3*  --   TRIG  --  225*  --    Estimated Creatinine Clearance: 36.7 ml/min (by C-G formula based on Cr of 2.12).    Recent Labs  05/28/13 1749 05/28/13 2358 05/29/13 0636  GLUCAP 125* 126* 135*    Medications:  Scheduled:  . allopurinol  300 mg Oral q morning - 10a  . amLODipine  10 mg Oral q morning - 10a  . atenolol  50 mg Oral Daily  . chlorthalidone  25 mg Oral Daily  . doxazosin  8 mg Oral QHS  . heparin subcutaneous  5,000 Units Subcutaneous Q8H  . insulin aspart  0-9 Units Subcutaneous Q6H   Infusions:  . dextrose 5 % and 0.45 % NaCl with KCl 20 mEq/L 25 mL/hr at 05/28/13 1800  . Marland KitchenTPN (CLINIMIX-E) Adult 50 mL/hr at 05/28/13 1814   And  . fat emulsion  240 mL (05/28/13 1815)   Insulin Requirements in the past 24 hours:  CBG range 126-135 5 units SSI required - sensitive scale q4h  Nutritional Goals:  RD recs: Kcal: 1800- 2000  Protein: 100-110 grams  Fluid: 2.3-2.5 L/day  Clinimix E 5/15 at a goal rate of 27ml/hr + 20% fat emulsion at 40ml/hr to provide: 100g/day protein, 1894 Kcal/day.  Current Nutrition:  NPO except sips with meds Clinimix E5/15 at 87ml/hr + 20 Lipids 21ml/hr  IVF: D5 1/2 NS at 25 ml/hr  Assessment: 27 yoM with rectal cancer s/p lap assisted LAR with coloanal anastomosis and diverting loop ileostomy, now with post-op ileus. TNA started 11/21 since surgery does not anticipate bowel function to return over next couple of days and patient has acute protein calorie malnutrition.   Glucose - at goal < 150  Electrolytes - K+ low, all other WNL  Renal function: CKD, SCr elevated   LFTs - initially elevated but improved today  TGs - 225 (11/22) - ok to continue lipids for now but will monitor closely - recheck in am  Prealbumin - low, 9.8 (11/20),  11.3 (11/22), recheck in am  TPN Access: Double lumen PICC placed 11/20 TPN day#: 3  Plan: At 1800 today:  Advance Clinimix E5/15 to 70 ml/hr.  20% fat emulsion at 57ml/hr.  KCl 79meq IV x 4 runs  Plan to advance as tolerated to the goal rate.  TNA to contain standard multivitamins and trace elements.  Continue IVF 37ml/hr.  Continue SSI sensitive scale q4h .   TNA lab panels on Mondays & Thursdays.  F/u daily.   Peggyann Juba, PharmD, BCPS Pager: [phone] 05/29/2013,7:21 AM

## 2013-05-30 LAB — GLUCOSE, CAPILLARY
Glucose-Capillary: 134 mg/dL — ABNORMAL HIGH (ref 70–99)
Glucose-Capillary: 146 mg/dL — ABNORMAL HIGH (ref 70–99)
Glucose-Capillary: 151 mg/dL — ABNORMAL HIGH (ref 70–99)

## 2013-05-30 LAB — COMPREHENSIVE METABOLIC PANEL
ALT: 48 U/L (ref 0–53)
AST: 32 U/L (ref 0–37)
Alkaline Phosphatase: 191 U/L — ABNORMAL HIGH (ref 39–117)
BUN: 30 mg/dL — ABNORMAL HIGH (ref 6–23)
CO2: 41 mEq/L (ref 19–32)
Calcium: 9.7 mg/dL (ref 8.4–10.5)
Chloride: 99 mEq/L (ref 96–112)
Creatinine, Ser: 2.11 mg/dL — ABNORMAL HIGH (ref 0.50–1.35)
GFR calc Af Amer: 36 mL/min — ABNORMAL LOW (ref >90)
GFR calc non Af Amer: 31 mL/min — ABNORMAL LOW (ref >90)
Glucose, Bld: 138 mg/dL — ABNORMAL HIGH (ref 70–99)
Sodium: 149 mEq/L — ABNORMAL HIGH (ref 135–145)
Total Bilirubin: 0.4 mg/dL (ref 0.3–1.2)

## 2013-05-30 LAB — CBC
HCT: 25.1 % — ABNORMAL LOW (ref 39.0–52.0)
MCH: 28.1 pg (ref 26.0–34.0)
MCHC: 31.5 g/dL (ref 30.0–36.0)
MCV: 89.3 fL (ref 78.0–100.0)
Platelets: 339 10*3/uL (ref 150–400)
RDW: 16.5 % — ABNORMAL HIGH (ref 11.5–15.5)
WBC: 12.8 10*3/uL — ABNORMAL HIGH (ref 4.0–10.5)

## 2013-05-30 LAB — DIFFERENTIAL
Basophils Absolute: 0 10*3/uL (ref 0.0–0.1)
Basophils Relative: 0 % (ref 0–1)
Eosinophils Absolute: 0.1 10*3/uL (ref 0.0–0.7)
Eosinophils Relative: 1 % (ref 0–5)
Monocytes Absolute: 1.1 10*3/uL — ABNORMAL HIGH (ref 0.1–1.0)

## 2013-05-30 LAB — TRIGLYCERIDES: Triglycerides: 238 mg/dL — ABNORMAL HIGH (ref <150)

## 2013-05-30 MED ORDER — POTASSIUM CHLORIDE 10 MEQ/100ML IV SOLN
10.0000 meq | INTRAVENOUS | Status: AC
  Administered 2013-05-30 (×2): 10 meq via INTRAVENOUS
  Filled 2013-05-30 (×2): qty 100

## 2013-05-30 MED ORDER — TRACE MINERALS CR-CU-F-FE-I-MN-MO-SE-ZN IV SOLN
INTRAVENOUS | Status: AC
  Administered 2013-05-30: 18:00:00 via INTRAVENOUS
  Filled 2013-05-30: qty 2000

## 2013-05-30 MED ORDER — FAT EMULSION 20 % IV EMUL
250.0000 mL | INTRAVENOUS | Status: AC
  Administered 2013-05-30: 18:00:00 250 mL via INTRAVENOUS
  Filled 2013-05-30: qty 250

## 2013-05-30 NOTE — Progress Notes (Signed)
Occupational Therapy Treatment Patient Details Name: Jason [name] MRN: [id number] DOB: Oct 31, 1945 Today's Date: 05/30/2013 Time: 1204-1220 OT Time Calculation (min): 16 min  OT Assessment / Plan / Recommendation  History of present illness Patient is a 67 y/o male admitted for colectomy with diverting colostomy due to rectal cancer.   OT comments  Pt doing well with OT this day. Pt stood at sink for grooming activity with S  Follow Up Recommendations  No OT follow up             Frequency Min 2X/week   Progress towards OT Goals Progress towards OT goals: Progressing toward goals  Plan Discharge plan remains appropriate    Precautions / Restrictions Precautions Precautions: Fall Precaution Comments: NPO, NG suction, HR incr. Restrictions Weight Bearing Restrictions: No       ADL  Grooming: Performed;Wash/dry face;Teeth care;Denture care Where Assessed - Grooming: Supported standing;Unsupported standing;Other (comment) (standing at sink) Lower Body Dressing: Performed;Supervision/safety Where Assessed - Lower Body Dressing: Unsupported sit to stand;Other (comment) (including socks)      OT Goals(current goals can now be found in the care plan section)    Visit Information  Assistance Needed: +1 History of Present Illness: Patient is a 67 y/o male admitted for colectomy with diverting colostomy due to rectal cancer.          Cognition  Cognition Arousal/Alertness: Awake/alert Behavior During Therapy: WFL for tasks assessed/performed Overall Cognitive Status: Within Functional Limits for tasks assessed    Mobility  Bed Mobility Supine to Sit: Not tested (comment) Transfers Sit to Stand: 5: Supervision;From chair/3-in-1;With upper extremity assist;With armrests Stand to Sit: 5: Supervision;To chair/3-in-1;With upper extremity assist;With armrests Details for Transfer Assistance: improved mobility, steady to staqnd up       Balance Balance Balance Assessed:  Yes Static Standing Balance Static Standing - Balance Support: Bilateral upper extremity supported Static Standing - Level of Assistance: 5: Stand by assistance   End of Session OT - End of Session Activity Tolerance: Patient tolerated treatment well Patient left: in chair;with call bell/phone within reach  [place], Thereasa Parkin 05/30/2013, 12:29 PM

## 2013-05-30 NOTE — Progress Notes (Signed)
Pt continuing to have PVC's during the night time. MD is aware if previous occasions. Pt asymptomatic. BP stable. Ill continue to monitor and will pass along to day nurse.

## 2013-05-30 NOTE — Progress Notes (Signed)
PARENTERAL NUTRITION CONSULT NOTE - FOLLOW UP  Pharmacy Consult for TNA Indication: Post-op Ileus  Allergies  Allergen Reactions  . Nsaids     Asked by surgeon to add this medication class as intolerance due to patients renal insufficiency.    Patient Measurements: Height: 5\' 7"  (170.2 cm) Weight: 199 lb (90.266 kg) IBW/kg (Calculated) : 66.1 Adjusted Body Weight: 73kg Usual Weight: 104kg  Vital Signs: Temp: 99.2 F (37.3 C) (11/24 0610) Temp src: Oral (11/24 0610) BP: 114/56 mmHg (11/24 0610) Pulse Rate: 99 (11/24 0610)   Intake/Output from previous day: 11/23 0701 - 11/24 0700 In: 1350 [I.V.:250; IV Piggyback:1100] Out: 4560 [Urine:1675; Emesis/NG output:2800; Drains:85]  Labs:  Recent Labs  05/28/13 0445 05/30/13 0501  WBC 9.6 12.8*  HGB 7.8* 7.9*  HCT 24.3* 25.1*  PLT 287 339    Recent Labs  05/27/13 0933 05/28/13 0445 05/29/13 0500 05/30/13 0501  NA 142 142 144 149*  K 4.0 3.3* 3.1* 3.6  CL 106 102 99 99  CO2 29 31 38* 41*  GLUCOSE 145* 136* 134* 138*  BUN 26* 32* 31* 30*  CREATININE 2.03* 2.17* 2.12* 2.11*  CALCIUM 9.4 9.5 9.5 9.7  MG 1.7 1.9  --  2.0  PHOS 3.3 3.6  --  4.2  PROT  --  5.7* 5.8* 6.2  ALBUMIN  --  2.4* 2.4* 2.5*  AST  --  64* 25 32  ALT  --  72* 49 48  ALKPHOS  --  149* 143* 191*  BILITOT  --  0.3 0.3 0.4  PREALBUMIN  --  11.3*  --   --   TRIG  --  225*  --  238*   Estimated Creatinine Clearance: 36.9 ml/min (by C-G formula based on Cr of 2.11).    Recent Labs  05/29/13 1743 05/30/13 0036 05/30/13 0610  GLUCAP 123* 154* 134*    Medications:  Scheduled:  . allopurinol  300 mg Oral q morning - 10a  . amLODipine  10 mg Oral q morning - 10a  . atenolol  50 mg Oral Daily  . chlorthalidone  25 mg Oral Daily  . doxazosin  8 mg Oral QHS  . heparin subcutaneous  5,000 Units Subcutaneous Q8H  . insulin aspart  0-9 Units Subcutaneous Q6H   Infusions:  . dextrose 5 % and 0.45 % NaCl with KCl 20 mEq/L 25 mL/hr at  05/28/13 1800  . Marland KitchenTPN (CLINIMIX-E) Adult 70 mL/hr at 05/29/13 1751   And  . fat emulsion 240 mL (05/29/13 1750)   Insulin Requirements in the past 24 hours:   CBG range 123-154  6 units SSI required - sensitive scale q4h  Nutritional Goals:   Per RD recs: 1800- 2000 kcal/day, 100-110 g/day protein, 2.3-2.5 L/day fluid  Clinimix E 5/15 at a goal rate of 98ml/hr + 20% fat emulsion at 68ml/hr to provide: 100g/day protein, 1894 Kcal/day.  Current Nutrition:   NPO except sips with meds  Clinimix E5/15 at 69ml/hr + 20 Lipids 34ml/hr  IVF: D5 1/2 NS KCl 20 at 25 ml/hr  Assessment: 31 yoM with rectal cancer s/p lap assisted LAR with coloanal anastomosis and diverting loop ileostomy, now with post-op ileus. TNA started 11/21 since surgery does not anticipate bowel function to return over next couple of days and patient has acute protein calorie malnutrition.   Glucose - at goal < 150  Electrolytes - Na 149 (cannot adjust in premix TPN), K wnl, CO2 41.  Mag and Phos wnl.  Renal  function: CKD, SCr elevated   LFTs - initially elevated but improved and remain wnl.  Alk phos increasing, 191 today.  TGs - 225 (11/22), 238 (11/24) - ok to continue lipids for now but will monitor closely.  Prealbumin - low, 9.8 (11/20), 11.3 (11/22), pending (11/24)  TPN Access: Double lumen PICC placed 11/20 TPN day#: 4  Plan: At 1800 today:  Increase to Clinimix E 5/15 at 83 ml/hr.  20% fat emulsion at 64ml/hr.  TNA to contain standard multivitamins and trace elements.  Continue IVF at 30ml/hr.  Continue SSI sensitive scale q4h .   TNA lab panels on Mondays & Thursdays.  F/u daily.  KCl 77meq IV x 2 runs, f/u BMET with AM labs   Gretta Arab PharmD, BCPS Pager [phone] 05/30/2013 8:47 AM

## 2013-05-30 NOTE — Progress Notes (Signed)
Physical Therapy Treatment Patient Details Name: Jason [name] MRN: [id number] DOB: 1946/07/02 Today's Date: 05/30/2013 Time: [id number] PT Time Calculation (min): 17 min  PT Assessment / Plan / Recommendation  History of Present Illness Patient is a 67 y/o male admitted for colectomy with diverting colostomy due to rectal cancer.   PT Comments   Pt improving in mobility. Pt's HR up into 140's. RN aware.  Follow Up Recommendations  Home health PT     Does the patient have the potential to tolerate intense rehabilitation     Barriers to Discharge        Equipment Recommendations       Recommendations for Other Services    Frequency Min 3X/week   Progress towards PT Goals Progress towards PT goals: Progressing toward goals  Plan Current plan remains appropriate    Precautions / Restrictions Precautions Precautions: Fall Precaution Comments: NPO, NG suction, HR incr. Restrictions Weight Bearing Restrictions: No   Pertinent Vitals/Pain 120-140's    Mobility  Bed Mobility Supine to Sit: Not tested (comment) Transfers Sit to Stand: 5: Supervision;From chair/3-in-1;With upper extremity assist;With armrests Stand to Sit: 5: Supervision;To chair/3-in-1;With upper extremity assist;With armrests Details for Transfer Assistance: improved mobility, steady to staqnd up Ambulation/Gait Ambulation/Gait Assistance: 4: Min guard Ambulation Distance (Feet): 190 Feet Assistive device: Rolling walker Ambulation/Gait Assistance Details: limited due to HR 142. Gait Pattern: Step-through pattern;Decreased stride length General Gait Details: pt barely uses RW for walking.    Exercises     PT Diagnosis:    PT Problem List:   PT Treatment Interventions:     PT Goals (current goals can now be found in the care plan section)    Visit Information  Assistance Needed: +1 History of Present Illness: Patient is a 67 y/o male admitted for colectomy with diverting colostomy due to rectal  cancer.    Subjective Data      Cognition  Cognition Arousal/Alertness: Awake/alert    Balance     End of Session PT - End of Session Activity Tolerance: Patient tolerated treatment well Patient left: in chair;with call bell/phone within reach Nurse Communication: Mobility status   GP     [name] 05/30/2013, 11:49 AM Tresa Endo PT [phone]

## 2013-05-30 NOTE — Progress Notes (Signed)
CRITICAL VALUE ALERT  Critical value received:  CO2- 41  Date of notification:  05/30/13  Time of notification:  0543  Critical value read back:yes  Nurse who received alert:  Bayard Males RN  MD notified (1st page):  Barry Dienes  Time of first page:  [phone]  MD notified (2nd page):  Time of second page:  Responding MD:  Barry Dienes  Time MD responded:  [phone]

## 2013-05-30 NOTE — Consult Note (Addendum)
WOC ostomy follow up Stoma type/location: Ileostomy to right lower quad with red rubber catheter intact.  Pt is feeling poorly with NG and did not participate in pouch change demonstration.  No family members at bedside. Stomal assessment/size: Stoma red and viable, above skin level, 1 1/4 inches  Peristomal assessment: Intact skin surrounding. Output 50cc liquid brown stool Ostomy pouching: 2pc.  Education provided: Applied 2 piece pouching system with barrier ring to maintain seal.  Pt did not watch process or ask questions, [facility] team will plan to follow while in hospital. Recommend home health assistance after discharge if pt does not go to SNF. Supplies at bedside for staff use. Julien Girt MSN, RN, [place], [place], [place]

## 2013-05-30 NOTE — Progress Notes (Signed)
Critical lab- CO2 of 41 received. MD paged and received no orders. Will continue to monitor. Blanchard Kelch, RN

## 2013-05-30 NOTE — Progress Notes (Signed)
Patient ID: Jason [name], male   DOB: 1946/03/08, 67 y.o.   MRN: [id number] 10 Days Post-Op Lap assisted LAR with coloanal anastomosis and diverting loop ileostomy Subjective: Pt having some mild abd pain this morning.  No nausea.  2.8L NG output in last 24h   Objective: Vital signs in last 24 hours: Temp:  [98.5 F (36.9 C)-99.2 F (37.3 C)] 99.2 F (37.3 C) (11/24 0610) Pulse Rate:  [92-102] 99 (11/24 0610) Resp:  [16-22] 16 (11/24 0610) BP: (110-124)/(56-68) 114/56 mmHg (11/24 0610) SpO2:  [96 %-99 %] 99 % (11/24 0610)   Intake/Output from previous day: 11/23 0701 - 11/24 0700 In: 1350 [I.V.:250; IV Piggyback:1100] Out: 4560 [Urine:1675; Emesis/NG output:2800; Drains:85] Intake/Output this shift:   General appearance: appears mildly uncomfortable    Resp: no respiratory distress GI: much softer, markedly less distended.   JP: serosang discharge Incision: no significant drainage Ostomy: beefy red, viable, small output of bilious output  Lab Results:   Recent Labs  05/28/13 0445 05/30/13 0501  WBC 9.6 12.8*  HGB 7.8* 7.9*  HCT 24.3* 25.1*  PLT 287 339   BMET  Recent Labs  05/29/13 0500 05/30/13 0501  NA 144 149*  K 3.1* 3.6  CL 99 99  CO2 38* 41*  GLUCOSE 134* 138*  BUN 31* 30*  CREATININE 2.12* 2.11*  CALCIUM 9.5 9.7   PT/INR No results found for this basename: LABPROT, INR,  in the last 72 hours ABG No results found for this basename: PHART, PCO2, PO2, HCO3,  in the last 72 hours  MEDS, Scheduled . allopurinol  300 mg Oral q morning - 10a  . amLODipine  10 mg Oral q morning - 10a  . atenolol  50 mg Oral Daily  . chlorthalidone  25 mg Oral Daily  . doxazosin  8 mg Oral QHS  . heparin subcutaneous  5,000 Units Subcutaneous Q8H  . insulin aspart  0-9 Units Subcutaneous Q6H    Studies/Results: No results found.  Assessment: s/p Procedure(s): LAPAROSCOPIC LOW ANTERIOR RESECTION WITH SPLENIC FLEXURE MOBILIZATION diverting OSTOMY Patient Active  Problem List   Diagnosis Date Noted  . Rectal cancer 02/01/2013  . Anemia 07/20/2012  . Chronic renal insufficiency 07/31/2011  . EDEMA 09/21/2010  . OBSTRUCTIVE SLEEP APNEA 07/29/2010  . GOUT 05/11/2007  . ERECTILE DYSFUNCTION, MILD 05/11/2007  . HYPERTENSION 05/11/2007  . COLONIC POLYPS, HX OF 05/11/2007     Plan: Prolonged post operative Ileus.  Keep NGT.  Cont TPN Keep foley for repeated acute urinary retention Acute protein calorie malnutrition: Alb 2.4, prealb 9.8. Cont TPN until able to take PO Ostomy care and teaching Hypokalemia - better today.   CKD stage 2: Cr slightly elevated from baseline of 1.9, most likely due to TPN.  Will monitor for now.  I anticipate it will normalize after fluid shifts complete and off TPN.   WBC mildly elevated: Recheck labs in AM   LOS: 10 days     05/30/2013 8:45 AM

## 2013-05-31 ENCOUNTER — Ambulatory Visit: Payer: Medicare Other | Admitting: Oncology

## 2013-05-31 ENCOUNTER — Inpatient Hospital Stay (HOSPITAL_COMMUNITY): Payer: Medicare Other

## 2013-05-31 LAB — GLUCOSE, CAPILLARY
Glucose-Capillary: 147 mg/dL — ABNORMAL HIGH (ref 70–99)
Glucose-Capillary: 155 mg/dL — ABNORMAL HIGH (ref 70–99)
Glucose-Capillary: 160 mg/dL — ABNORMAL HIGH (ref 70–99)

## 2013-05-31 LAB — CBC
HCT: 25.3 % — ABNORMAL LOW (ref 39.0–52.0)
Hemoglobin: 8 g/dL — ABNORMAL LOW (ref 13.0–17.0)
MCV: 89.7 fL (ref 78.0–100.0)
Platelets: 352 10*3/uL (ref 150–400)
RBC: 2.82 MIL/uL — ABNORMAL LOW (ref 4.22–5.81)
RDW: 16.6 % — ABNORMAL HIGH (ref 11.5–15.5)
WBC: 17 10*3/uL — ABNORMAL HIGH (ref 4.0–10.5)

## 2013-05-31 LAB — URINALYSIS, ROUTINE W REFLEX MICROSCOPIC
Bilirubin Urine: NEGATIVE
Glucose, UA: NEGATIVE mg/dL
Ketones, ur: NEGATIVE mg/dL
Nitrite: NEGATIVE
Specific Gravity, Urine: 1.016 (ref 1.005–1.030)
pH: 8 (ref 5.0–8.0)

## 2013-05-31 LAB — URINE MICROSCOPIC-ADD ON

## 2013-05-31 LAB — BASIC METABOLIC PANEL
CO2: 41 mEq/L (ref 19–32)
Glucose, Bld: 159 mg/dL — ABNORMAL HIGH (ref 70–99)
Potassium: 3.6 mEq/L (ref 3.5–5.1)
Sodium: 145 mEq/L (ref 135–145)

## 2013-05-31 MED ORDER — FAT EMULSION 20 % IV EMUL
250.0000 mL | INTRAVENOUS | Status: AC
  Administered 2013-05-31: 18:00:00 250 mL via INTRAVENOUS
  Filled 2013-05-31: qty 250

## 2013-05-31 MED ORDER — TRACE MINERALS CR-CU-F-FE-I-MN-MO-SE-ZN IV SOLN
INTRAVENOUS | Status: AC
  Administered 2013-05-31: 18:00:00 via INTRAVENOUS
  Filled 2013-05-31: qty 2000

## 2013-05-31 MED ORDER — STERILE WATER FOR INJECTION IV SOLN
INTRAVENOUS | Status: DC
  Administered 2013-05-31: 10:00:00 via INTRAVENOUS
  Filled 2013-05-31 (×3): qty 9.7

## 2013-05-31 MED ORDER — PIPERACILLIN-TAZOBACTAM 3.375 G IVPB
3.3750 g | Freq: Three times a day (TID) | INTRAVENOUS | Status: DC
  Administered 2013-05-31 – 2013-06-01 (×3): 3.375 g via INTRAVENOUS
  Filled 2013-05-31 (×4): qty 50

## 2013-05-31 NOTE — Progress Notes (Signed)
ANTIBIOTIC CONSULT NOTE - INITIAL  Pharmacy Consult for Zosyn Indication: Empiric coverage (possible abdominal or UTI)  Allergies  Allergen Reactions  . Nsaids     Asked by surgeon to add this medication class as intolerance due to patients renal insufficiency.    Patient Measurements: Height: 5\' 7"  (170.2 cm) Weight: 199 lb (90.266 kg) IBW/kg (Calculated) : 66.1  Vital Signs: Temp: 99 F (37.2 C) (11/25 0546) Temp src: Oral (11/25 0546) BP: 140/69 mmHg (11/25 1007) Pulse Rate: 104 (11/25 1007) Intake/Output from previous day: 11/24 0701 - 11/25 0700 In: 1819 [P.O.:220; I.V.:575; NG/GT:80; IV Piggyback:200; [id number] Out: 2638 [Urine:1090; Emesis/NG output:1450; Drains:53; Stool:45]  Labs:  Recent Labs  05/29/13 0500 05/30/13 0501 05/31/13 0510  WBC  --  12.8* 17.0*  HGB  --  7.9* 8.0*  PLT  --  339 352  CREATININE 2.12* 2.11* 2.75*   Estimated Creatinine Clearance: 28.3 ml/min (by C-G formula based on Cr of 2.75).  Microbiology: No results found for this or any previous visit (from the past 720 hour(s)).  Medical History: Past Medical History  Diagnosis Date  . Gout   . Hypertension   . Hyperplastic colon polyp   . Diverticulosis   . Anemia   . Arthritis   . ED (erectile dysfunction)   . Chronic renal insufficiency, stage II (mild)   . rectal ca dx'd 02/01/13    rectal  . History of radiation therapy 02/21/13-03/31/13    rectum 50.4Gy total dose    Medications:  Anti-infectives   Start     Dose/Rate Route Frequency Ordered Stop   05/21/13 0200  cefoTEtan (CEFOTAN) 2 g in dextrose 5 % 50 mL IVPB     2 g 100 mL/hr over 30 Minutes Intravenous Every 12 hours 05/20/13 1808 05/21/13 0203   05/20/13 0551  cefoTEtan (CEFOTAN) 2 g in dextrose 5 % 50 mL IVPB     2 g 100 mL/hr over 30 Minutes Intravenous On call to O.R. 05/20/13 0551 05/20/13 1345     Assessment: 21 yoM with rectal cancer s/p lap assisted LAR with coloanal anastomosis and diverting loop  ileostomy, now with post-op ileus on TNA.  Pharmacy is consulted to dose Zosyn for possible abdominal infection or UTI.  Tmax: afebrile  WBCs: elevated, 17  Renal: SCr elevated at 2.75, CrCl ~ 28 ml/min CG, ~ 26 ml/min N  Goal of Therapy:  Appropriate abx dosing, eradication of infection.   Plan:   Zosyn 3.375g IV Q8H infused over 4hrs.  Follow up renal fxn and culture results as available.   Gretta Arab PharmD, BCPS Pager [phone] 05/31/2013 12:08 PM

## 2013-05-31 NOTE — Progress Notes (Signed)
PARENTERAL NUTRITION CONSULT NOTE - FOLLOW UP  Pharmacy Consult for TNA Indication: Post-op Ileus  Allergies  Allergen Reactions  . Nsaids     Asked by surgeon to add this medication class as intolerance due to patients renal insufficiency.    Patient Measurements: Height: 5\' 7"  (170.2 cm) Weight: 199 lb (90.266 kg) IBW/kg (Calculated) : 66.1 Adjusted Body Weight: 73kg Usual Weight: 104kg  Vital Signs: Temp: 99 F (37.2 C) (11/25 0546) Temp src: Oral (11/25 0546) BP: 109/61 mmHg (11/25 0546) Pulse Rate: 93 (11/25 0546)   Intake/Output from previous day: 11/24 0701 - 11/25 0700 In: 1819 [P.O.:220; I.V.:575; NG/GT:80; IV Piggyback:200; [id number] Out: 2638 [Urine:1090; Emesis/NG output:1450; Drains:53; Stool:45]  Labs:  Recent Labs  05/30/13 0501 05/31/13 0510  WBC 12.8* 17.0*  HGB 7.9* 8.0*  HCT 25.1* 25.3*  PLT 339 352    Recent Labs  05/29/13 0500 05/30/13 0501 05/31/13 0510  NA 144 149* 145  K 3.1* 3.6 3.6  CL 99 99 94*  CO2 38* 41* 41*  GLUCOSE 134* 138* 159*  BUN 31* 30* 44*  CREATININE 2.12* 2.11* 2.75*  CALCIUM 9.5 9.7 9.6  MG  --  2.0  --   PHOS  --  4.2  --   PROT 5.8* 6.2  --   ALBUMIN 2.4* 2.5*  --   AST 25 32  --   ALT 49 48  --   ALKPHOS 143* 191*  --   BILITOT 0.3 0.4  --   PREALBUMIN  --  14.2*  --   TRIG  --  238*  --    Estimated Creatinine Clearance: 28.3 ml/min (by C-G formula based on Cr of 2.75).    Recent Labs  05/30/13 1717 05/30/13 2356 05/31/13 0543  GLUCAP 151* 155* 152*    Medications:  Scheduled:  . allopurinol  300 mg Oral q morning - 10a  . amLODipine  10 mg Oral q morning - 10a  . atenolol  50 mg Oral Daily  . chlorthalidone  25 mg Oral Daily  . doxazosin  8 mg Oral QHS  . heparin subcutaneous  5,000 Units Subcutaneous Q8H  . insulin aspart  0-9 Units Subcutaneous Q6H   Infusions:  . Marland KitchenTPN (CLINIMIX-E) Adult 83 mL/hr at 05/30/13 1827   And  . fat emulsion 250 mL (05/30/13 1827)  .  sodium bicarbonate  infusion 1/4 NS 1000 mL     Insulin Requirements in the past 24 hours:   CBG range 134-155  7 units SSI required - sensitive scale q4h  Nutritional Goals:   Per RD recs: 1800- 2000 kcal/day, 100-110 g/day protein, 2.3-2.5 L/day fluid  Clinimix E 5/15 at a goal rate of 57ml/hr + 20% fat emulsion at 74ml/hr to provide: 100g/day protein, 1894 Kcal/day.  Current Nutrition:   NPO except sips with meds  Clinimix E5/15 at 42ml/hr + 20% Lipids at 39ml/hr  IVF: NaCl 0.225% with NaBicarb 50 mEq @ 50 ml/hr  Assessment: 84 yoM with rectal cancer s/p lap assisted LAR with coloanal anastomosis and diverting loop ileostomy, now with post-op ileus. TNA started 11/21 since surgery does not anticipate bowel function to return over next couple of days and patient has acute protein calorie malnutrition.   Glucose - near goal < 150,   Electrolytes - Na and K wnl.  CO2 41.  Mag and Phos wnl on 11/24.  Renal function: CKD, SCr elevated and increased significantly.  IVF changed and increased rate.  LFTs - initially elevated but  improved and remain wnl.  Alk phos increasing, 191 (11/24).  TGs - 225 (11/22), 238 (11/24) - ok to continue lipids for now but will monitor closely.  Prealbumin - low, 9.8 (11/20), 11.3 (11/22), 14.2(11/24)  NG output over the last 24 hours ~ 1.5L, but RN notes NG tube is out on 11/25 AM.  No orders to replace NG tube yet.  TPN Access: Double lumen PICC placed 11/20 TPN day#: 4  Plan: At 1800 today:  Continue Clinimix E 5/15 at 83 ml/hr.  20% fat emulsion at 70ml/hr.  TNA to contain standard multivitamins and trace elements.  Continue IVF per MD.  Continue SSI sensitive scale q4h.   TNA lab panels on Mondays & Thursdays.  F/u daily.  Gretta Arab PharmD, BCPS Pager [phone] 05/31/2013 11:17 AM

## 2013-05-31 NOTE — Progress Notes (Signed)
Patient ID: Jason [name], male   DOB: [birth year], 67 y.o.   MRN: [id number] 11 Days Post-Op Lap assisted LAR with coloanal anastomosis and diverting loop ileostomy Subjective: Pt feeling better this morning.  No nausea.  1.5L NG output in last 24h.  Bilious output in ileostomy bag   Objective: Vital signs in last 24 hours: Temp:  [97.5 F (36.4 C)-99 F (37.2 C)] 99 F (37.2 C) (11/25 0546) Pulse Rate:  [93-96] 93 (11/25 0546) Resp:  [16-18] 16 (11/25 0546) BP: (109-115)/(61-65) 109/61 mmHg (11/25 0546) SpO2:  [95 %-100 %] 99 % (11/25 0546)   Intake/Output from previous day: 11/24 0701 - 11/25 0700 In: 1819 [P.O.:220; I.V.:575; NG/GT:80; IV Piggyback:200; [id number] Out: 2638 [Urine:1090; Emesis/NG output:1450; Drains:53; Stool:45] Intake/Output this shift:   General appearance: NAD    Resp: no respiratory distress GI: much softer, markedly less distended.   JP: serosang discharge Incision: no significant drainage, no signs of infection Ostomy: beefy red, viable, small output of bilious output  Lab Results:   Recent Labs  05/30/13 0501 05/31/13 0510  WBC 12.8* 17.0*  HGB 7.9* 8.0*  HCT 25.1* 25.3*  PLT 339 352   BMET  Recent Labs  05/30/13 0501 05/31/13 0510  NA 149* 145  K 3.6 3.6  CL 99 94*  CO2 41* 41*  GLUCOSE 138* 159*  BUN 30* 44*  CREATININE 2.11* 2.75*  CALCIUM 9.7 9.6   PT/INR No results found for this basename: LABPROT, INR,  in the last 72 hours ABG No results found for this basename: PHART, PCO2, PO2, HCO3,  in the last 72 hours  MEDS, Scheduled . allopurinol  300 mg Oral q morning - 10a  . amLODipine  10 mg Oral q morning - 10a  . atenolol  50 mg Oral Daily  . chlorthalidone  25 mg Oral Daily  . doxazosin  8 mg Oral QHS  . heparin subcutaneous  5,000 Units Subcutaneous Q8H  . insulin aspart  0-9 Units Subcutaneous Q6H    Studies/Results: No results found.  Assessment: s/p Procedure(s): LAPAROSCOPIC LOW ANTERIOR RESECTION WITH  SPLENIC FLEXURE MOBILIZATION diverting OSTOMY Patient Active Problem List   Diagnosis Date Noted  . Rectal cancer 02/01/2013  . Anemia 07/20/2012  . Chronic renal insufficiency 07/31/2011  . EDEMA 09/21/2010  . OBSTRUCTIVE SLEEP APNEA 07/29/2010  . GOUT 05/11/2007  . ERECTILE DYSFUNCTION, MILD 05/11/2007  . HYPERTENSION 05/11/2007  . COLONIC POLYPS, HX OF 05/11/2007     Plan: Prolonged post operative Ileus.  Starting to have ostomy function, clamp NGT today and monitor residuals.  Cont TPN for now Keep foley for repeated acute urinary retention.  Will need to see urology as an outpatient and possibly as an inpatient, if renal function worsens  Acute protein calorie malnutrition: Alb 2.5, prealb 14.2. Cont TPN until able to take PO Ostomy care and teaching Hypokalemia - better today.   CKD stage 2: Cr elevated from baseline of 1.9, most likely due to TPN, although NG losses are very high as well.  Pt is down ~9L since admission.  Will switch to bicarb MIV as pt also has an acidosis.  Will monitor closely.  Pt also with elevated wbc and chronic foley due to urinary retention.  Will check UA- suspect UTI.  If no UTI, will get renal US and consult nephrology for further assistance.   WBC elevated: wounds clean, bowel function returning, no cough or SOB, will send UA for possible UTI.  Much less likely cause: PICC  line infection (recent insertion, no fevers) D/C JP due to minimal clear output   LOS: 11 days     05/31/2013 8:29 AM

## 2013-05-31 NOTE — Progress Notes (Signed)
RN walked into room and patients NG tube was out. RN immediately notified MD. MD did not want to replace NG for now, but to closely monitor patient. Will continue to monitor patient. Jason [name], Jason [name]

## 2013-06-01 LAB — CBC
HCT: 24.2 % — ABNORMAL LOW (ref 39.0–52.0)
Hemoglobin: 7.6 g/dL — ABNORMAL LOW (ref 13.0–17.0)
MCH: 27.6 pg (ref 26.0–34.0)
MCHC: 31.4 g/dL (ref 30.0–36.0)
MCV: 88 fL (ref 78.0–100.0)
RDW: 16.2 % — ABNORMAL HIGH (ref 11.5–15.5)

## 2013-06-01 LAB — PREPARE RBC (CROSSMATCH)

## 2013-06-01 LAB — BASIC METABOLIC PANEL
BUN: 58 mg/dL — ABNORMAL HIGH (ref 6–23)
Calcium: 9.1 mg/dL (ref 8.4–10.5)
Creatinine, Ser: 3.5 mg/dL — ABNORMAL HIGH (ref 0.50–1.35)
GFR calc Af Amer: 19 mL/min — ABNORMAL LOW (ref >90)
GFR calc non Af Amer: 17 mL/min — ABNORMAL LOW (ref >90)
Glucose, Bld: 159 mg/dL — ABNORMAL HIGH (ref 70–99)
Potassium: 3 mEq/L — ABNORMAL LOW (ref 3.5–5.1)
Sodium: 140 mEq/L (ref 135–145)

## 2013-06-01 LAB — GLUCOSE, CAPILLARY
Glucose-Capillary: 160 mg/dL — ABNORMAL HIGH (ref 70–99)
Glucose-Capillary: 159 mg/dL — ABNORMAL HIGH (ref 70–99)
Glucose-Capillary: 119 mg/dL — ABNORMAL HIGH (ref 70–99)

## 2013-06-01 MED ORDER — SODIUM CHLORIDE 0.9 % IV SOLN
INTRAVENOUS | Status: DC
  Administered 2013-06-02 (×2): via INTRAVENOUS

## 2013-06-01 MED ORDER — MAGIC MOUTHWASH
5.0000 mL | Freq: Three times a day (TID) | ORAL | Status: AC
  Administered 2013-06-01 – 2013-06-05 (×13): 5 mL via ORAL
  Filled 2013-06-01 (×15): qty 5

## 2013-06-01 MED ORDER — POTASSIUM CHLORIDE IN NACL 20-0.45 MEQ/L-% IV SOLN
INTRAVENOUS | Status: AC
  Administered 2013-06-01 (×2): via INTRAVENOUS
  Filled 2013-06-01 (×3): qty 1000

## 2013-06-01 MED ORDER — FAT EMULSION 20 % IV EMUL
250.0000 mL | INTRAVENOUS | Status: AC
  Administered 2013-06-01: 17:00:00 250 mL via INTRAVENOUS
  Filled 2013-06-01: qty 250

## 2013-06-01 MED ORDER — TRACE MINERALS CR-CU-F-FE-I-MN-MO-SE-ZN IV SOLN
INTRAVENOUS | Status: AC
  Administered 2013-06-01: 17:00:00 via INTRAVENOUS
  Filled 2013-06-01: qty 2000

## 2013-06-01 MED ORDER — METOCLOPRAMIDE HCL 5 MG/ML IJ SOLN: 5.0000 mg | Freq: Four times a day (QID) | INTRAMUSCULAR | Status: DC | PRN

## 2013-06-01 MED ORDER — PIPERACILLIN-TAZOBACTAM IN DEX 2-0.25 GM/50ML IV SOLN
2.2500 g | Freq: Four times a day (QID) | INTRAVENOUS | Status: DC
  Administered 2013-06-01 – 2013-06-05 (×15): 2.25 g via INTRAVENOUS
  Filled 2013-06-01 (×18): qty 50

## 2013-06-01 MED ORDER — KCL IN DEXTROSE-NACL 20-5-0.45 MEQ/L-%-% IV SOLN
INTRAVENOUS | Status: DC
  Administered 2013-06-01: 10:00:00 via INTRAVENOUS
  Filled 2013-06-01 (×2): qty 1000

## 2013-06-01 MED ORDER — POTASSIUM CHLORIDE IN NACL 20-0.45 MEQ/L-% IV SOLN: INTRAVENOUS | Status: DC

## 2013-06-01 NOTE — Progress Notes (Signed)
Patient ID: Jason [name], male   DOB: [birth year], 67 y.o.   MRN: [id number] 12 Days Post-Op Lap assisted LAR with coloanal anastomosis and diverting loop ileostomy Subjective: Pt feeling better this morning.  No nausea this am.  Some nausea overnight. NG fell out yesterday.  Bilious output in ileostomy bag   Objective: Vital signs in last 24 hours: Temp:  [98 F (36.7 C)-99.3 F (37.4 C)] 98.4 F (36.9 C) (11/26 0502) Pulse Rate:  [87-104] 91 (11/26 0502) Resp:  [16-18] 18 (11/26 0502) BP: (108-140)/(59-69) 111/60 mmHg (11/26 0502) SpO2:  [97 %-100 %] 100 % (11/26 0502)   Intake/Output from previous day: 11/25 0701 - 11/26 0700 In: 959.2 [P.O.:360; I.V.:549.2; IV Piggyback:50] Out: [id number] [Urine:900; Stool:475] Intake/Output this shift:   General appearance: NAD    Resp: no respiratory distress GI: much softer, markedly less distended.   JP: serosang discharge Incision: no significant drainage, no signs of infection Ostomy: beefy red, viable, watery bilious output  Lab Results:   Recent Labs  05/31/13 0510 06/01/13 0535  WBC 17.0* 16.2*  HGB 8.0* 7.6*  HCT 25.3* 24.2*  PLT 352 323   BMET  Recent Labs  05/31/13 0510 06/01/13 0535  NA 145 140  K 3.6 3.0*  CL 94* 89*  CO2 41* 39*  GLUCOSE 159* 159*  BUN 44* 58*  CREATININE 2.75* 3.50*  CALCIUM 9.6 9.1   PT/INR No results found for this basename: LABPROT, INR,  in the last 72 hours ABG No results found for this basename: PHART, PCO2, PO2, HCO3,  in the last 72 hours  MEDS, Scheduled . allopurinol  300 mg Oral q morning - 10a  . amLODipine  10 mg Oral q morning - 10a  . atenolol  50 mg Oral Daily  . chlorthalidone  25 mg Oral Daily  . doxazosin  8 mg Oral QHS  . heparin subcutaneous  5,000 Units Subcutaneous Q8H  . insulin aspart  0-9 Units Subcutaneous Q6H  . piperacillin-tazobactam (ZOSYN)  IV  3.375 g Intravenous Q8H    Studies/Results: US Renal  05/31/2013   CLINICAL DATA:  Elevated creatinine.  Hypertension. Rectal carcinoma post resection.  EXAM: RENAL/URINARY TRACT ULTRASOUND COMPLETE  COMPARISON:  CT 05/27/2013  FINDINGS: Right Kidney  Length: 10.7 cm in length. Isoechoic compared to adjacent liver. 16 x 16 x 20 mm hypoechoic lesion in the interpolar region, seen on studies dating back to 04/24/2010. No hydronephrosis.  Left Kidney  Length: 9.1 cm length. 7 x 8 x 10 mm hypoechoic upper pole lesion stable since MR 05/10/2013. No hydronephrosis.  Bladder  Incompletely distended  IMPRESSION: 1. Negative for hydronephrosis. 2. Bilateral renal lesions corresponding to the cysts seen on previous MR.   Electronically Signed   By: Arne Cleveland M.D.   On: 05/31/2013 16:44   Dg Abd Portable 1v  05/31/2013   CLINICAL DATA:  Evaluate nasogastric catheter placement  EXAM: PORTABLE ABDOMEN - 1 VIEW  COMPARISON:  05/27/2013  FINDINGS: There remain multiple dilated loops of small bowel similar to that seen on the prior CT examination. A drainage catheter is noted in the right hemipelvis stable in appearance from the prior exam. An ileostomy is also noted in the right lower quadrant stable from the prior exam. No free air is seen. No new focal abnormality is noted. No nasogastric catheter is identified on this exam. The degree of gastric distention has improved from the prior study.  IMPRESSION: No definitive a nasogastric catheter is identified on this  exam. The gastric distention however has improved from the prior exam. The remainder of the study is stable.   Electronically Signed   By: Inez Catalina M.D.   On: 05/31/2013 10:07    Assessment: s/p Procedure(s): LAPAROSCOPIC LOW ANTERIOR RESECTION WITH SPLENIC FLEXURE MOBILIZATION diverting OSTOMY Patient Active Problem List   Diagnosis Date Noted  . Rectal cancer 02/01/2013  . Anemia 07/20/2012  . Chronic renal insufficiency 07/31/2011  . EDEMA 09/21/2010  . OBSTRUCTIVE SLEEP APNEA 07/29/2010  . GOUT 05/11/2007  . ERECTILE DYSFUNCTION, MILD  05/11/2007  . HYPERTENSION 05/11/2007  . COLONIC POLYPS, HX OF 05/11/2007     Plan: Prolonged post operative Ileus.  Starting to have ostomy function, will start clears today. Cont TPN for now Keep foley for repeated acute urinary retention.  Will need to see urology as an outpatient, Renal US shows no signs of obstruction Acute protein calorie malnutrition: Alb 2.5, prealb 14.2. Cont TPN until able to take PO Ostomy care and teaching Hypokalemia - K 3.0 today, will hold on repletion given renal function CKD stage 2: Cr elevated from baseline of 1.9, most likely due to fluid shifts with NG output.  TPN may be contributing to azotemia.  Will cont IVFs with TPN. UA: clear. renal US shows no obstruction. I have consulted nephrology for further assistance.  Urine lytes pending.   WBC elevated: wounds clean, bowel function returning, no cough or SOB, UA neg.  Possible abd cause althoug recent CT showed no abscess.  Cont Zosyn for now.  Much less likely cause: PICC line infection (recent insertion, no fevers)    LOS: 12 days     06/01/2013 8:57 AM

## 2013-06-01 NOTE — Progress Notes (Signed)
PARENTERAL NUTRITION CONSULT NOTE - FOLLOW UP  Pharmacy Consult for TNA Indication: Post-op Ileus  Allergies  Allergen Reactions  . Nsaids     Asked by surgeon to add this medication class as intolerance due to patients renal insufficiency.    Patient Measurements: Height: 5\' 7"  (170.2 cm) Weight: 199 lb (90.266 kg) IBW/kg (Calculated) : 66.1 Adjusted Body Weight: 73kg Usual Weight: 104kg  Vital Signs: Temp: 98.4 F (36.9 C) (11/26 0502) Temp src: Oral (11/26 0502) BP: 111/60 mmHg (11/26 0502) Pulse Rate: 91 (11/26 0502)   Intake/Output from previous day: 11/25 0701 - 11/26 0700 In: 959.2 [P.O.:360; I.V.:549.2; IV Piggyback:50] Out: [id number] [Urine:900; Stool:475]  Labs:  Recent Labs  05/30/13 0501 05/31/13 0510 06/01/13 0535  WBC 12.8* 17.0* 16.2*  HGB 7.9* 8.0* 7.6*  HCT 25.1* 25.3* 24.2*  PLT 339 352 323    Recent Labs  05/30/13 0501 05/31/13 0510 06/01/13 0535  NA 149* 145 140  K 3.6 3.6 3.0*  CL 99 94* 89*  CO2 41* 41* 39*  GLUCOSE 138* 159* 159*  BUN 30* 44* 58*  CREATININE 2.11* 2.75* 3.50*  CALCIUM 9.7 9.6 9.1  MG 2.0  --   --   PHOS 4.2  --   --   PROT 6.2  --   --   ALBUMIN 2.5*  --   --   AST 32  --   --   ALT 48  --   --   ALKPHOS 191*  --   --   BILITOT 0.4  --   --   PREALBUMIN 14.2*  --   --   TRIG 238*  --   --    Estimated Creatinine Clearance: 22.3 ml/min (by C-G formula based on Cr of 3.5).    Recent Labs  06/01/13 0012 06/01/13 0629 06/01/13 0753  GLUCAP 137* 160* 159*    Medications:  Scheduled:  . allopurinol  300 mg Oral q morning - 10a  . amLODipine  10 mg Oral q morning - 10a  . atenolol  50 mg Oral Daily  . chlorthalidone  25 mg Oral Daily  . doxazosin  8 mg Oral QHS  . heparin subcutaneous  5,000 Units Subcutaneous Q8H  . insulin aspart  0-9 Units Subcutaneous Q6H  . piperacillin-tazobactam (ZOSYN)  IV  3.375 g Intravenous Q8H   Infusions:  . dextrose 5 % and 0.45 % NaCl with KCl 20 mEq/L 175 mL/hr at  06/01/13 1001  . Marland KitchenTPN (CLINIMIX-E) Adult 83 mL/hr at 05/31/13 1804   And  . fat emulsion 250 mL (05/31/13 1805)   Insulin Requirements in the past 24 hours:   8 units SSI required - sensitive scale q4h  Nutritional Goals:   Per RD recs: 1800- 2000 kcal/day, 100-110 g/day protein, 2.3-2.5 L/day fluid  Clinimix E 5/15 at a goal rate of 109ml/hr + 20% fat emulsion at 59ml/hr to provide: 100g/day protein, 1894 Kcal/day.  Current Nutrition:   Diet: clears 11/26 as NGT fell out and starting to see ostomy function  Clinimix E5/15 at 3ml/hr + 20% Lipids at 69ml/hr  IVF: D5W 0.45% + 75meq KCL at 151ml/hr  Assessment: 89 yoM with rectal cancer s/p lap assisted LAR with coloanal anastomosis and diverting loop ileostomy, now with post-op ileus. TNA started 11/21 since surgery does not anticipate bowel function to return over next couple of days and patient has acute protein calorie malnutrition.   Glucose - 137 -160 (slightly above goal)   Electrolytes - K =  3 (MIVF now with KCl 34meq/L), Corrected Ca = 10.3  Renal function: CKD, SCr elevated and increased significantly, BUN elevated - Protein from TPN may be contributing to increase.  IVF changed and increased rate.  LFTs - initially elevated but improved and remain wnl.  Alk phos increasing, 191 (11/24).  TGs - 225 (11/22), 238 (11/24) - ok to continue lipids for now but will monitor closely.  Prealbumin - low, 9.8 (11/20), 11.3 (11/22), 14.2(11/24)  NG tube out on 11/25 AM.  No orders to replace NG tube yet.  TPN Access: Double lumen PICC placed 11/20 TPN day#: 5  Plan: At 1800 today:  Continue Clinimix E 5/15 at 83 ml/hr.  Continue electrolytes in TPN at this time but monitor closely  20% fat emulsion at 66ml/hr.  Watch trig with mild hypertriglyceridemia  TNA to contain standard multivitamins and trace elements.  Continue IVF per MD, will remove D5W from IVF in hopes of improving CBGs  Continue SSI sensitive scale  q4h.   TNA lab panels on Mondays & Thursdays.  F/u daily.  Doreene Eland, PharmD, BCPS.   Pager: [phone] 06/01/2013 10:04 AM

## 2013-06-01 NOTE — Progress Notes (Signed)
ANTIBIOTIC CONSULT NOTE   Pharmacy Consult for Zosyn Indication: Empiric coverage (possible abdominal or UTI)  Allergies  Allergen Reactions  . Nsaids     Asked by surgeon to add this medication class as intolerance due to patients renal insufficiency.    Patient Measurements: Height: 5\' 7"  (170.2 cm) Weight: 199 lb (90.266 kg) IBW/kg (Calculated) : 66.1  Vital Signs: Temp: 98.4 F (36.9 C) (11/26 0502) Temp src: Oral (11/26 0502) BP: 111/60 mmHg (11/26 0502) Pulse Rate: 91 (11/26 0502) Intake/Output from previous day: 11/25 0701 - 11/26 0700 In: 959.2 [P.O.:360; I.V.:549.2; IV Piggyback:50] Out: [id number] [Urine:900; Stool:475]  Labs:  Recent Labs  05/30/13 0501 05/31/13 0510 06/01/13 0535  WBC 12.8* 17.0* 16.2*  HGB 7.9* 8.0* 7.6*  PLT 339 352 323  CREATININE 2.11* 2.75* 3.50*   Estimated Creatinine Clearance: 22.3 ml/min (by C-G formula based on Cr of 3.5).  Microbiology: No results found for this or any previous visit (from the past 720 hour(s)).  Medical History: Past Medical History  Diagnosis Date  . Gout   . Hypertension   . Hyperplastic colon polyp   . Diverticulosis   . Anemia   . Arthritis   . ED (erectile dysfunction)   . Chronic renal insufficiency, stage II (mild)   . rectal ca dx'd 02/01/13    rectal  . History of radiation therapy 02/21/13-03/31/13    rectum 50.4Gy total dose    Medications:  Anti-infectives   Start     Dose/Rate Route Frequency Ordered Stop   05/31/13 1300  piperacillin-tazobactam (ZOSYN) IVPB 3.375 g     3.375 g 12.5 mL/hr over 240 Minutes Intravenous 3 times per day 05/31/13 1212     05/21/13 0200  cefoTEtan (CEFOTAN) 2 g in dextrose 5 % 50 mL IVPB     2 g 100 mL/hr over 30 Minutes Intravenous Every 12 hours 05/20/13 1808 05/21/13 0203   05/20/13 0551  cefoTEtan (CEFOTAN) 2 g in dextrose 5 % 50 mL IVPB     2 g 100 mL/hr over 30 Minutes Intravenous On call to O.R. 05/20/13 0551 05/20/13 1345     Assessment: 77 yoM  with rectal cancer s/p lap assisted LAR with coloanal anastomosis and diverting loop ileostomy, now with post-op ileus on TNA.  Pharmacy is consulted to dose Zosyn for possible abdominal infection or UTI.  11/14 >> Cefotetan >> 11/15 11/25 >> ZEI >>   Tmax: afebrile WBCs: elevated, 16.2 Renal: SCr elevated at 3.5, CrCl ~ 22 ml/min CG, ~ 18 ml/min N  No cultures    Goal of Therapy:  Appropriate abx dosing, eradication of infection.   Plan:  Day #2 zosyn  Due to worsening renal fx, change to zosyn 2.25gm IV q6h.  Unknown source of infection, CT abd = no abscess  Follow up renal fxn and culture results as available.  Doreene Eland, PharmD, BCPS.   Pager: [phone] 06/01/2013 10:22 AM

## 2013-06-01 NOTE — Progress Notes (Signed)
PT Cancellation Note  Patient Details Name: Jason [name] MRN: [id number] DOB: 11-23-1945   Cancelled Treatment:    Reason Eval/Treat Not Completed:  (pt going for test.)   [name] 06/01/2013, 5:05 PM

## 2013-06-01 NOTE — Consult Note (Signed)
Jason [name] is a 67 y.o. M with rectal cancer who is s/p neoadjuvant chemoRt. On 11/15 he underwent  Laparoscopic low anterior resection with splenic flexure mobilization and diverting colostomy.  He has known stage 3 CKD and had an immediate bump in sCreat up to 2.78 1 day post op from  preop creat of 1.86 mg/dl.  He has had high output ostomy drainage and is currently listed as negative at 10,367 cc.  He has symptomatic orthostatic hypotension with BPs in the 96/50 range repeatedly today.  He has known hypertension and takes BP meds followed by his PCP.  He is treated with doxazocin, atenolol-hct and amlodipine. Those meds are listed on current MAR. Today his sCreat is up to 3.5mg /dl, hgb 7.6g.  He has lost 14 pounds since 11/14.  Urin output has decreased but he is not oliguric. He made 900cc UOP yesterday. Renal US no obstruction and foley in place.    Past Medical History  Diagnosis Date  . Gout   . Hypertension   . Hyperplastic colon polyp   . Diverticulosis   . Anemia   . Arthritis   . ED (erectile dysfunction)   . Chronic renal insufficiency, stage II (mild)   . rectal ca dx'd 02/01/13    rectal  . History of radiation therapy 02/21/13-03/31/13    rectum 50.4Gy total dose   Past Surgical History  Procedure Laterality Date  . Colonoscopy  2010    [facility]  . Eus N/A 02/03/2013    Procedure: LOWER ENDOSCOPIC ULTRASOUND (EUS);  Surgeon: Milus Banister, MD;  Location: [facility];  Service: Endoscopy;  Laterality: N/A;  . Laparoscopic low anterior resection N/A 05/20/2013    Procedure: LAPAROSCOPIC LOW ANTERIOR RESECTION WITH SPLENIC FLEXURE MOBILIZATION;  Surgeon: Leighton Ruff, MD;  Location: [facility];  Service: General;  Laterality: N/A;  . Ostomy N/A 05/20/2013    Procedure: diverting OSTOMY;  Surgeon: Leighton Ruff, MD;  Location: [facility];  Service: General;  Laterality: N/A;   Social History:  reports that he quit smoking about 35 years ago. He has never used smokeless tobacco.  He reports that he does not drink alcohol or use illicit drugs. Allergies:  Allergies  Allergen Reactions  . Nsaids     Asked by surgeon to add this medication class as intolerance due to patients renal insufficiency.   Family History  Problem Relation Age of Onset  . Hypertension Other   . Stroke Mother   . Stroke Father   . Colon cancer Neg Hx     Medications:  Scheduled: . allopurinol  300 mg Oral q morning - 10a  . amLODipine  10 mg Oral q morning - 10a  . atenolol  50 mg Oral Daily  . chlorthalidone  25 mg Oral Daily  . doxazosin  8 mg Oral QHS  . heparin subcutaneous  5,000 Units Subcutaneous Q8H  . insulin aspart  0-9 Units Subcutaneous Q6H  . piperacillin-tazobactam (ZOSYN)  IV  2.25 g Intravenous Q6H   ROS: he is thirsty and has notice weight loss.  Blood pressure 96/50, pulse 87, temperature 98.1 F (36.7 C), temperature source Oral, resp. rate 18, height 5\' 7"  (1.702 m), weight 90.266 kg (199 lb), SpO2 100.00%.  General appearance: alert, cooperative and ill appearing Head: Normocephalic, without obvious abnormality, atraumatic Eyes: negative Ears: some wasting Nose: Nares normal. Septum midline. Mucosa normal. No drainage or sinus tenderness. Throat: thrush Resp: clear to auscultation bilaterally Chest wall: no tenderness Cardio: regular  rate and rhythm, S1, S2 normal, no murmur, click, rub or gallop GI: post op bandages and rlq ostomy bag with liquod drainage Extremities: extremities normal, atraumatic, no cyanosis or edema Skin: Skin color, texture, turgor normal. No rashes or lesions Neurologic: Grossly normal Results for orders placed during the hospital encounter of 05/20/13 (from the past 48 hour(s))  GLUCOSE, CAPILLARY     Status: Abnormal   Collection Time    05/30/13  5:17 PM      Result Value Range   Glucose-Capillary 151 (*) 70 - 99 mg/dL   Comment 1 Documented in Chart     Comment 2 Notify RN    GLUCOSE, CAPILLARY     Status: Abnormal    Collection Time    05/30/13 11:56 PM      Result Value Range   Glucose-Capillary 155 (*) 70 - 99 mg/dL   Comment 1 Documented in Chart     Comment 2 Notify RN    CBC     Status: Abnormal   Collection Time    05/31/13  5:10 AM      Result Value Range   WBC 17.0 (*) 4.0 - 10.5 K/uL   RBC 2.82 (*) 4.22 - 5.81 MIL/uL   Hemoglobin 8.0 (*) 13.0 - 17.0 g/dL   HCT 25.3 (*) 39.0 - 52.0 %   MCV 89.7  78.0 - 100.0 fL   MCH 28.4  26.0 - 34.0 pg   MCHC 31.6  30.0 - 36.0 g/dL   RDW 16.6 (*) 11.5 - 15.5 %   Platelets 352  150 - 400 K/uL  BASIC METABOLIC PANEL     Status: Abnormal   Collection Time    05/31/13  5:10 AM      Result Value Range   Sodium 145  135 - 145 mEq/L   Potassium 3.6  3.5 - 5.1 mEq/L   Chloride 94 (*) 96 - 112 mEq/L   CO2 41 (*) 19 - 32 mEq/L   Comment: CRITICAL RESULT CALLED TO, READ BACK BY AND VERIFIED WITH:     ASANDY RN AT 0640 ON [id number] BY DLONG   Glucose, Bld 159 (*) 70 - 99 mg/dL   BUN 44 (*) 6 - 23 mg/dL   Creatinine, Ser 2.75 (*) 0.50 - 1.35 mg/dL   Calcium 9.6  8.4 - 10.5 mg/dL   GFR calc non Af Amer 22 (*) >90 mL/min   GFR calc Af Amer 26 (*) >90 mL/min   Comment: (NOTE)     The eGFR has been calculated using the CKD EPI equation.     This calculation has not been validated in all clinical situations.     eGFR's persistently <90 mL/min signify possible Chronic Kidney     Disease.  GLUCOSE, CAPILLARY     Status: Abnormal   Collection Time    05/31/13  5:43 AM      Result Value Range   Glucose-Capillary 152 (*) 70 - 99 mg/dL   Comment 1 Documented in Chart     Comment 2 Notify RN    URINALYSIS, ROUTINE W REFLEX MICROSCOPIC     Status: Abnormal   Collection Time    05/31/13 10:00 AM      Result Value Range   Color, Urine YELLOW  YELLOW   APPearance CLEAR  CLEAR   Specific Gravity, Urine 1.016  1.005 - 1.030   pH 8.0  5.0 - 8.0   Glucose, UA NEGATIVE  NEGATIVE mg/dL   Hgb urine  dipstick TRACE (*) NEGATIVE   Bilirubin Urine NEGATIVE  NEGATIVE    Ketones, ur NEGATIVE  NEGATIVE mg/dL   Protein, ur 30 (*) NEGATIVE mg/dL   Urobilinogen, UA 0.2  0.0 - 1.0 mg/dL   Nitrite NEGATIVE  NEGATIVE   Leukocytes, UA SMALL (*) NEGATIVE  URINE MICROSCOPIC-ADD ON     Status: None   Collection Time    05/31/13 10:00 AM      Result Value Range   Squamous Epithelial / LPF RARE  RARE   WBC, UA 3-6  <3 WBC/hpf   RBC / HPF 3-6  <3 RBC/hpf   Bacteria, UA RARE  RARE  GLUCOSE, CAPILLARY     Status: Abnormal   Collection Time    05/31/13 12:02 PM      Result Value Range   Glucose-Capillary 147 (*) 70 - 99 mg/dL   Comment 1 Documented in Chart     Comment 2 Notify RN    GLUCOSE, CAPILLARY     Status: Abnormal   Collection Time    05/31/13  4:52 PM      Result Value Range   Glucose-Capillary 160 (*) 70 - 99 mg/dL  GLUCOSE, CAPILLARY     Status: Abnormal   Collection Time    06/01/13 12:12 AM      Result Value Range   Glucose-Capillary 137 (*) 70 - 99 mg/dL  CBC     Status: Abnormal   Collection Time    06/01/13  5:35 AM      Result Value Range   WBC 16.2 (*) 4.0 - 10.5 K/uL   RBC 2.75 (*) 4.22 - 5.81 MIL/uL   Hemoglobin 7.6 (*) 13.0 - 17.0 g/dL   HCT 24.2 (*) 39.0 - 52.0 %   MCV 88.0  78.0 - 100.0 fL   MCH 27.6  26.0 - 34.0 pg   MCHC 31.4  30.0 - 36.0 g/dL   RDW 16.2 (*) 11.5 - 15.5 %   Platelets 323  150 - 400 K/uL  BASIC METABOLIC PANEL     Status: Abnormal   Collection Time    06/01/13  5:35 AM      Result Value Range   Sodium 140  135 - 145 mEq/L   Potassium 3.0 (*) 3.5 - 5.1 mEq/L   Chloride 89 (*) 96 - 112 mEq/L   CO2 39 (*) 19 - 32 mEq/L   Glucose, Bld 159 (*) 70 - 99 mg/dL   BUN 58 (*) 6 - 23 mg/dL   Creatinine, Ser 3.50 (*) 0.50 - 1.35 mg/dL   Calcium 9.1  8.4 - 10.5 mg/dL   GFR calc non Af Amer 17 (*) >90 mL/min   GFR calc Af Amer 19 (*) >90 mL/min   Comment: (NOTE)     The eGFR has been calculated using the CKD EPI equation.     This calculation has not been validated in all clinical situations.     eGFR's  persistently <90 mL/min signify possible Chronic Kidney     Disease.  GLUCOSE, CAPILLARY     Status: Abnormal   Collection Time    06/01/13  6:29 AM      Result Value Range   Glucose-Capillary 160 (*) 70 - 99 mg/dL  GLUCOSE, CAPILLARY     Status: Abnormal   Collection Time    06/01/13  7:53 AM      Result Value Range   Glucose-Capillary 159 (*) 70 - 99 mg/dL  GLUCOSE, CAPILLARY  Status: Abnormal   Collection Time    06/01/13 11:59 AM      Result Value Range   Glucose-Capillary 177 (*) 70 - 99 mg/dL   US Renal  05/31/2013   CLINICAL DATA:  Elevated creatinine. Hypertension. Rectal carcinoma post resection.  EXAM: RENAL/URINARY TRACT ULTRASOUND COMPLETE  COMPARISON:  CT 05/27/2013  FINDINGS: Right Kidney  Length: 10.7 cm in length. Isoechoic compared to adjacent liver. 16 x 16 x 20 mm hypoechoic lesion in the interpolar region, seen on studies dating back to 04/24/2010. No hydronephrosis.  Left Kidney  Length: 9.1 cm length. 7 x 8 x 10 mm hypoechoic upper pole lesion stable since MR 05/10/2013. No hydronephrosis.  Bladder  Incompletely distended  IMPRESSION: 1. Negative for hydronephrosis. 2. Bilateral renal lesions corresponding to the cysts seen on previous MR.   Electronically Signed   By: Arne Cleveland M.D.   On: 05/31/2013 16:44   Dg Abd Portable 1v  05/31/2013   CLINICAL DATA:  Evaluate nasogastric catheter placement  EXAM: PORTABLE ABDOMEN - 1 VIEW  COMPARISON:  05/27/2013  FINDINGS: There remain multiple dilated loops of small bowel similar to that seen on the prior CT examination. A drainage catheter is noted in the right hemipelvis stable in appearance from the prior exam. An ileostomy is also noted in the right lower quadrant stable from the prior exam. No free air is seen. No new focal abnormality is noted. No nasogastric catheter is identified on this exam. The degree of gastric distention has improved from the prior study.  IMPRESSION: No definitive a nasogastric catheter is  identified on this exam. The gastric distention however has improved from the prior exam. The remainder of the study is stable.   Electronically Signed   By: Inez Catalina M.D.   On: 05/31/2013 10:07    Assessment:  1 Acute Kidney Injury, hemodynamically mediated 2 Stage 3 CKD 3 Hypochloremic metabolic alkalosis 4 Hypokalemia 5 Anemia due to ABL  Plan: 1  Volume expand aggressively with chloride ions 2  Stop all BP meds for now 3  Transfusion of PRBCs  Avari Gelles C 06/01/2013, 3:21 PM

## 2013-06-01 NOTE — Consult Note (Signed)
WOC ostomy follow up Stoma type/location: RLQ, ileostomy. Support in place Stomal assessment/size: pink, moist, budded Output liquid brown and more flatus per staff Ostomy pouching: 2pc. Just changed by bedside nursing staff, due to more flatus. Pouch filled with flatus and "blew off". Education provided: education per nursing staff on "burping" the pouch to avoid the pouch "blowing off" again.   Enrolled patient in [facility] Discharge yes Educational booklet in room  [place] team will follow along with you for care and teaching. Will need HHRN for further education and support. [place], [place]

## 2013-06-02 LAB — COMPREHENSIVE METABOLIC PANEL
ALT: 37 U/L (ref 0–53)
AST: 23 U/L (ref 0–37)
BUN: 58 mg/dL — ABNORMAL HIGH (ref 6–23)
CO2: 32 mEq/L (ref 19–32)
Calcium: 8.8 mg/dL (ref 8.4–10.5)
Creatinine, Ser: 3.54 mg/dL — ABNORMAL HIGH (ref 0.50–1.35)
GFR calc Af Amer: 19 mL/min — ABNORMAL LOW (ref >90)
Glucose, Bld: 142 mg/dL — ABNORMAL HIGH (ref 70–99)
Sodium: 134 mEq/L — ABNORMAL LOW (ref 135–145)
Total Protein: 5.9 g/dL — ABNORMAL LOW (ref 6.0–8.3)

## 2013-06-02 LAB — CBC
HCT: 24.4 % — ABNORMAL LOW (ref 39.0–52.0)
RDW: 15.7 % — ABNORMAL HIGH (ref 11.5–15.5)
WBC: 14 10*3/uL — ABNORMAL HIGH (ref 4.0–10.5)

## 2013-06-02 LAB — GLUCOSE, CAPILLARY
Glucose-Capillary: 130 mg/dL — ABNORMAL HIGH (ref 70–99)
Glucose-Capillary: 137 mg/dL — ABNORMAL HIGH (ref 70–99)
Glucose-Capillary: 158 mg/dL — ABNORMAL HIGH (ref 70–99)

## 2013-06-02 LAB — TRIGLYCERIDES: Triglycerides: 165 mg/dL — ABNORMAL HIGH (ref <150)

## 2013-06-02 LAB — PHOSPHORUS: Phosphorus: 4.6 mg/dL (ref 2.3–4.6)

## 2013-06-02 LAB — SODIUM, URINE, RANDOM: Sodium, Ur: 58 mEq/L

## 2013-06-02 MED ORDER — SODIUM CHLORIDE 0.9 % IV SOLN
INTRAVENOUS | Status: DC
  Administered 2013-06-02 – 2013-06-04 (×6): via INTRAVENOUS
  Administered 2013-06-04: 50 mL/h via INTRAVENOUS
  Administered 2013-06-05 – 2013-06-08 (×7): via INTRAVENOUS

## 2013-06-02 MED ORDER — TRACE MINERALS CR-CU-F-FE-I-MN-MO-SE-ZN IV SOLN
INTRAVENOUS | Status: DC
  Filled 2013-06-02: qty 2000

## 2013-06-02 MED ORDER — ENSURE PUDDING PO PUDG
1.0000 | Freq: Three times a day (TID) | ORAL | Status: DC
  Administered 2013-06-02 – 2013-06-06 (×7): 1 via ORAL
  Filled 2013-06-02 (×18): qty 1

## 2013-06-02 MED ORDER — POTASSIUM CHLORIDE IN NACL 40-0.9 MEQ/L-% IV SOLN
INTRAVENOUS | Status: AC
  Administered 2013-06-02 (×2): via INTRAVENOUS
  Filled 2013-06-02 (×2): qty 1000

## 2013-06-02 MED ORDER — ALLOPURINOL 100 MG PO TABS
100.0000 mg | ORAL_TABLET | Freq: Every day | ORAL | Status: DC
  Administered 2013-06-02 – 2013-06-12 (×8): 100 mg via ORAL
  Filled 2013-06-02 (×11): qty 1

## 2013-06-02 MED ORDER — FAT EMULSION 20 % IV EMUL
250.0000 mL | INTRAVENOUS | Status: DC
  Filled 2013-06-02: qty 250

## 2013-06-02 MED ORDER — TRACE MINERALS CR-CU-F-FE-I-MN-MO-SE-ZN IV SOLN
INTRAVENOUS | Status: AC
  Administered 2013-06-02: 18:00:00 via INTRAVENOUS
  Filled 2013-06-02: qty 1000

## 2013-06-02 MED ORDER — FAT EMULSION 20 % IV EMUL
250.0000 mL | INTRAVENOUS | Status: AC
Start: 2013-06-02 — End: 2013-06-03
  Administered 2013-06-02: 250 mL via INTRAVENOUS
  Filled 2013-06-02: qty 250

## 2013-06-02 NOTE — Progress Notes (Signed)
PARENTERAL NUTRITION CONSULT NOTE - FOLLOW UP  Pharmacy Consult for TNA Indication: Post-op Ileus  Allergies  Allergen Reactions  . Nsaids     Asked by surgeon to add this medication class as intolerance due to patients renal insufficiency.    Patient Measurements: Height: 5\' 7"  (170.2 cm) Weight: 185 lb 10 oz (84.2 kg) IBW/kg (Calculated) : 66.1 Adjusted Body Weight: 73kg Usual Weight: 104kg  Vital Signs: Temp: 98.7 F (37.1 C) (11/27 0829) Temp src: Oral (11/27 0829) BP: 107/60 mmHg (11/27 0829) Pulse Rate: 85 (11/27 0829)   Intake/Output from previous day: 11/26 0701 - 11/27 0700 In: 4311.8 [I.V.:3121.3; Blood:12.5; IV Piggyback:100; [id number] Out: [id number]; [id number]  Labs:  Recent Labs  05/31/13 0510 06/01/13 0535 06/02/13 0510  WBC 17.0* 16.2* 14.0*  HGB 8.0* 7.6* 7.9*  HCT 25.3* 24.2* 24.4*  PLT 352 323 303    Recent Labs  05/31/13 0510 06/01/13 0535 06/02/13 0510  NA 145 140 134*  K 3.6 3.0* 3.3*  CL 94* 89* 92*  CO2 41* 39* 32  GLUCOSE 159* 159* 142*  BUN 44* 58* 58*  CREATININE 2.75* 3.50* 3.54*  CALCIUM 9.6 9.1 8.8  MG  --   --  2.2  PHOS  --   --  4.6  PROT  --   --  5.9*  ALBUMIN  --   --  2.2*  AST  --   --  23  ALT  --   --  37  ALKPHOS  --   --  176*  BILITOT  --   --  0.4   Estimated Creatinine Clearance: 21.3 ml/min (by C-G formula based on Cr of 3.54).    Recent Labs  06/01/13 1713 06/02/13 06/02/13 0536  GLUCAP 119* 137* 158*    Medications:  Scheduled:  . allopurinol  100 mg Oral Daily  . heparin subcutaneous  5,000 Units Subcutaneous Q8H  . insulin aspart  0-9 Units Subcutaneous Q6H  . magic mouthwash  5 mL Oral TID  . piperacillin-tazobactam (ZOSYN)  IV  2.25 g Intravenous Q6H   Infusions:  . sodium chloride    . 0.9 % NaCl with KCl 40 mEq / L    . Marland KitchenTPN (CLINIMIX-E) Adult 83 mL/hr at 06/01/13 1700   And  . fat emulsion 250 mL (06/01/13 1700)   Insulin Requirements in the past 24 hours:   3  units SSI required - sensitive scale q4h  Nutritional Goals:   Per RD recs: 1800- 2000 kcal/day, 100-110 g/day protein, 2.3-2.5 L/day fluid  Clinimix E 5/15 at a goal rate of 42ml/hr + 20% fat emulsion at 40ml/hr to provide: 100g/day protein, 1894 Kcal/day.  Current Nutrition:   Diet: clears 11/26 as NGT fell out and starting to see ostomy function- no intake charted  Clinimix E5/15 at 46ml/hr + 20% Lipids at 32ml/hr  IVF: NS + 53meq KCL at 128ml/hr  Assessment: 73 yoM with rectal cancer s/p lap assisted LAR with coloanal anastomosis and diverting loop ileostomy, now with post-op ileus. TNA started 11/21 since surgery does not anticipate bowel function to return over next couple of days and patient has acute protein calorie malnutrition.   Glucose - 137 -158 (slightly above goal)   Electrolytes - K = 3.3 (MIVF now with KCl 71meq/L), Corrected Ca = 10.2  Renal function: CKD, SCr elevated and increased significantly, BUN elevated - Protein from TPN may be contributing to increase.  IVF changed and increased rate.  LFTs - initially elevated but  improved and remain wnl.  Alk phos slightly decreased to 176  TGs - 165 (11/27), 225 (11/22), 238 (11/24) - ok to continue lipids for now but will monitor closely.  Prealbumin - low, 9.8 (11/20), 11.3 (11/22), 14.2(11/24)  NG tube out on 11/25 AM.  No orders to replace NG tube yet.  Just spoke with Dr. Marcello Moores, pt tolerated CLD yesterday and will be advanced to solid foods today. TNA rate to decrease to 1/2 with new bag at 1800 today with hopeful d/c tomorrow per surgery MD.  TPN Access: Double lumen PICC placed 11/20 TPN day#: 6  Plan: At 1800 today:  Decrease Clinimix E 5/15 to 40 ml/hr.  Continue electrolytes in TPN at this time but monitor closely  20% fat emulsion at 63ml/hr.  Watch trig with mild hypertriglyceridemia  TNA to contain standard multivitamins and trace elements.  Continue IVF per MD  Continue SSI sensitive  scale q4h.   TNA lab panels on Mondays & Thursdays.  F/u daily  Thank you for the consult.  Johny Drilling, PharmD, BCPS Clinical Pharmacist Pager: [phone] Pharmacy: [phone] 06/02/2013 8:47 AM

## 2013-06-02 NOTE — Progress Notes (Signed)
Assessment:  1 Non-oliguric Acute Kidney Injury, hemodynamically mediated  2 Stage 3 CKD  3 Hypochloremic metabolic alkalosis, improving 4 Hypokalemia, improving  5 Anemia due to ABL  Plan:  1 Volume expand aggressively with chloride ions  2 Keep off all BP meds for now  3 Transfusion of PRBCs, 2nd unit today 4 More KCl  Subjective: Interval History: Feels better  Objective: Vital signs in last 24 hours: Temp:  [98.1 F (36.7 C)-100.1 F (37.8 C)] 98.7 F (37.1 C) (11/27 0500) Pulse Rate:  [83-107] 83 (11/27 0500) Resp:  [18-20] 18 (11/27 0500) BP: (92-137)/(50-68) 105/66 mmHg (11/27 0500) SpO2:  [98 %-100 %] 100 % (11/27 0500) Weight:  [84.097 kg (185 lb 6.4 oz)-84.2 kg (185 lb 10 oz)] 84.2 kg (185 lb 10 oz) (11/27 0543) Weight change:   Intake/Output from previous day: 11/26 0701 - 11/27 0700 In: 4311.8 [I.V.:3121.3; Blood:12.5; IV Piggyback:100; [id number] Out: [id number]; [id number] Intake/Output this shift:    General appearance: alert and cooperative Resp: clear to auscultation bilaterally Cardio: regular rate and rhythm, S1, S2 normal, no murmur, click, rub or gallop GI: ostomy bag with dark liquod Extremities: extremities normal, atraumatic, no cyanosis or edema  Lab Results:  Recent Labs  06/01/13 0535 06/02/13 0510  WBC 16.2* 14.0*  HGB 7.6* 7.9*  HCT 24.2* 24.4*  PLT 323 303   BMET:  Recent Labs  06/01/13 0535 06/02/13 0510  NA 140 134*  K 3.0* 3.3*  CL 89* 92*  CO2 39* 32  GLUCOSE 159* 142*  BUN 58* 58*  CREATININE 3.50* 3.54*  CALCIUM 9.1 8.8   No results found for this basename: PTH,  in the last 72 hours Iron Studies: No results found for this basename: IRON, TIBC, TRANSFERRIN, FERRITIN,  in the last 72 hours Studies/Results: US Renal  05/31/2013   CLINICAL DATA:  Elevated creatinine. Hypertension. Rectal carcinoma post resection.  EXAM: RENAL/URINARY TRACT ULTRASOUND COMPLETE  COMPARISON:  CT 05/27/2013  FINDINGS: Right  Kidney  Length: 10.7 cm in length. Isoechoic compared to adjacent liver. 16 x 16 x 20 mm hypoechoic lesion in the interpolar region, seen on studies dating back to 04/24/2010. No hydronephrosis.  Left Kidney  Length: 9.1 cm length. 7 x 8 x 10 mm hypoechoic upper pole lesion stable since MR 05/10/2013. No hydronephrosis.  Bladder  Incompletely distended  IMPRESSION: 1. Negative for hydronephrosis. 2. Bilateral renal lesions corresponding to the cysts seen on previous MR.   Electronically Signed   By: Arne Cleveland M.D.   On: 05/31/2013 16:44   Dg Abd Portable 1v  05/31/2013   CLINICAL DATA:  Evaluate nasogastric catheter placement  EXAM: PORTABLE ABDOMEN - 1 VIEW  COMPARISON:  05/27/2013  FINDINGS: There remain multiple dilated loops of small bowel similar to that seen on the prior CT examination. A drainage catheter is noted in the right hemipelvis stable in appearance from the prior exam. An ileostomy is also noted in the right lower quadrant stable from the prior exam. No free air is seen. No new focal abnormality is noted. No nasogastric catheter is identified on this exam. The degree of gastric distention has improved from the prior study.  IMPRESSION: No definitive a nasogastric catheter is identified on this exam. The gastric distention however has improved from the prior exam. The remainder of the study is stable.   Electronically Signed   By: Inez Catalina M.D.   On: 05/31/2013 10:07    Scheduled: . allopurinol  300 mg Oral q  morning - 10a  . heparin subcutaneous  5,000 Units Subcutaneous Q8H  . insulin aspart  0-9 Units Subcutaneous Q6H  . magic mouthwash  5 mL Oral TID  . piperacillin-tazobactam (ZOSYN)  IV  2.25 g Intravenous Q6H     LOS: 13 days   Jason [name] C 06/02/2013,7:44 AM

## 2013-06-02 NOTE — Progress Notes (Signed)
Patient ID: Jason [name], male   DOB: [birth year], 67 y.o.   MRN: [id number] 13 Days Post-Op Lap assisted LAR with coloanal anastomosis and diverting loop ileostomy Subjective: Pt feeling better this morning.  No nausea this am.  Tolerating clears.  Bilious output in ileostomy bag   Objective: Vital signs in last 24 hours: Temp:  [98.1 F (36.7 C)-100.1 F (37.8 C)] 99.3 F (37.4 C) (11/27 0845) Pulse Rate:  [83-107] 85 (11/27 0845) Resp:  [16-20] 16 (11/27 0845) BP: (92-137)/(50-68) 101/58 mmHg (11/27 0845) SpO2:  [98 %-100 %] 100 % (11/27 0845) Weight:  [185 lb 6.4 oz (84.097 kg)-185 lb 10 oz (84.2 kg)] 185 lb 10 oz (84.2 kg) (11/27 0543)   Intake/Output from previous day: 11/26 0701 - 11/27 0700 In: 4311.8 [I.V.:3121.3; Blood:12.5; IV Piggyback:100; [id number] Out: [id number]; [id number] Intake/Output this shift: Total I/O In: 12.5 [Blood:12.5] Out: -  General appearance: NAD    Resp: no respiratory distress GI: soft, non-distended.   Incision: no significant drainage, no signs of infection Ostomy: beefy red, viable, watery bilious output  Lab Results:   Recent Labs  06/01/13 0535 06/02/13 0510  WBC 16.2* 14.0*  HGB 7.6* 7.9*  HCT 24.2* 24.4*  PLT 323 303   BMET  Recent Labs  06/01/13 0535 06/02/13 0510  NA 140 134*  K 3.0* 3.3*  CL 89* 92*  CO2 39* 32  GLUCOSE 159* 142*  BUN 58* 58*  CREATININE 3.50* 3.54*  CALCIUM 9.1 8.8   PT/INR No results found for this basename: LABPROT, INR,  in the last 72 hours ABG No results found for this basename: PHART, PCO2, PO2, HCO3,  in the last 72 hours  MEDS, Scheduled . allopurinol  100 mg Oral Daily  . heparin subcutaneous  5,000 Units Subcutaneous Q8H  . insulin aspart  0-9 Units Subcutaneous Q6H  . magic mouthwash  5 mL Oral TID  . piperacillin-tazobactam (ZOSYN)  IV  2.25 g Intravenous Q6H    Studies/Results: US Renal  05/31/2013   CLINICAL DATA:  Elevated creatinine. Hypertension. Rectal  carcinoma post resection.  EXAM: RENAL/URINARY TRACT ULTRASOUND COMPLETE  COMPARISON:  CT 05/27/2013  FINDINGS: Right Kidney  Length: 10.7 cm in length. Isoechoic compared to adjacent liver. 16 x 16 x 20 mm hypoechoic lesion in the interpolar region, seen on studies dating back to 04/24/2010. No hydronephrosis.  Left Kidney  Length: 9.1 cm length. 7 x 8 x 10 mm hypoechoic upper pole lesion stable since MR 05/10/2013. No hydronephrosis.  Bladder  Incompletely distended  IMPRESSION: 1. Negative for hydronephrosis. 2. Bilateral renal lesions corresponding to the cysts seen on previous MR.   Electronically Signed   By: Arne Cleveland M.D.   On: 05/31/2013 16:44   Dg Abd Portable 1v  05/31/2013   CLINICAL DATA:  Evaluate nasogastric catheter placement  EXAM: PORTABLE ABDOMEN - 1 VIEW  COMPARISON:  05/27/2013  FINDINGS: There remain multiple dilated loops of small bowel similar to that seen on the prior CT examination. A drainage catheter is noted in the right hemipelvis stable in appearance from the prior exam. An ileostomy is also noted in the right lower quadrant stable from the prior exam. No free air is seen. No new focal abnormality is noted. No nasogastric catheter is identified on this exam. The degree of gastric distention has improved from the prior study.  IMPRESSION: No definitive a nasogastric catheter is identified on this exam. The gastric distention however has improved from the prior exam. The  remainder of the study is stable.   Electronically Signed   By: Inez Catalina M.D.   On: 05/31/2013 10:07    Assessment: s/p Procedure(s): LAPAROSCOPIC LOW ANTERIOR RESECTION WITH SPLENIC FLEXURE MOBILIZATION diverting OSTOMY Patient Active Problem List   Diagnosis Date Noted  . Rectal cancer 02/01/2013  . Anemia 07/20/2012  . Chronic renal insufficiency 07/31/2011  . EDEMA 09/21/2010  . OBSTRUCTIVE SLEEP APNEA 07/29/2010  . GOUT 05/11/2007  . ERECTILE DYSFUNCTION, MILD 05/11/2007  . HYPERTENSION  05/11/2007  . COLONIC POLYPS, HX OF 05/11/2007     Plan: Prolonged post operative Ileus.  Starting to have ostomy function, will advance to low fiber diet today and run TPN at half rate Keep foley for repeated acute urinary retention.  Will need to see urology as an outpatient, Renal US shows no signs of obstruction.  Will switch to leg bag Acute protein calorie malnutrition: will start low fiber diet, half rate TPN tonight Ostomy care and teaching Acute on Chronic Renal Insuffiencey: fluid management per nephrology, appreciate Nephrology help Hypokalemia - Fluid and K replacement per Nephrology  WBC elevated: wounds clean, bowel function returning, no cough or SOB, UA neg.  Possible abd cause althoug recent CT showed no abscess.  Cont Zosyn for now.  WBC trending down   LOS: 13 days     06/02/2013 8:53 AM

## 2013-06-03 LAB — RENAL FUNCTION PANEL
Albumin: 2.3 g/dL — ABNORMAL LOW (ref 3.5–5.2)
BUN: 49 mg/dL — ABNORMAL HIGH (ref 6–23)
CO2: 24 mEq/L (ref 19–32)
Calcium: 8.8 mg/dL (ref 8.4–10.5)
Chloride: 102 mEq/L (ref 96–112)
Creatinine, Ser: 3.28 mg/dL — ABNORMAL HIGH (ref 0.50–1.35)
Glucose, Bld: 128 mg/dL — ABNORMAL HIGH (ref 70–99)
Potassium: 4.3 mEq/L (ref 3.5–5.1)

## 2013-06-03 LAB — TYPE AND SCREEN
ABO/RH(D): AB POS
Antibody Screen: NEGATIVE
Unit division: 0

## 2013-06-03 LAB — CBC
HCT: 28.4 % — ABNORMAL LOW (ref 39.0–52.0)
Hemoglobin: 9.4 g/dL — ABNORMAL LOW (ref 13.0–17.0)
MCH: 28.9 pg (ref 26.0–34.0)
MCHC: 33.1 g/dL (ref 30.0–36.0)
MCV: 87.4 fL (ref 78.0–100.0)
Platelets: 286 10*3/uL (ref 150–400)
RBC: 3.25 MIL/uL — ABNORMAL LOW (ref 4.22–5.81)
RDW: 15.5 % (ref 11.5–15.5)

## 2013-06-03 LAB — GLUCOSE, CAPILLARY
Glucose-Capillary: 110 mg/dL — ABNORMAL HIGH (ref 70–99)
Glucose-Capillary: 142 mg/dL — ABNORMAL HIGH (ref 70–99)
Glucose-Capillary: 142 mg/dL — ABNORMAL HIGH (ref 70–99)

## 2013-06-03 MED ORDER — FAT EMULSION 20 % IV EMUL
250.0000 mL | INTRAVENOUS | Status: AC
  Administered 2013-06-03: 250 mL via INTRAVENOUS
  Filled 2013-06-03: qty 250

## 2013-06-03 MED ORDER — TRACE MINERALS CR-CU-F-FE-I-MN-MO-SE-ZN IV SOLN
INTRAVENOUS | Status: AC
  Administered 2013-06-03: 18:00:00 via INTRAVENOUS
  Filled 2013-06-03: qty 1000

## 2013-06-03 MED ORDER — ENSURE COMPLETE PO LIQD
237.0000 mL | Freq: Two times a day (BID) | ORAL | Status: DC
  Administered 2013-06-03: 237 mL via ORAL

## 2013-06-03 NOTE — Progress Notes (Signed)
ANTIBIOTIC CONSULT NOTE   Pharmacy Consult for Zosyn Indication: Empiric coverage (possible abdominal or UTI)  Allergies  Allergen Reactions  . Nsaids     Asked by surgeon to add this medication class as intolerance due to patients renal insufficiency.    Patient Measurements: Height: 5\' 7"  (170.2 cm) Weight: 190 lb 0.6 oz (86.2 kg) IBW/kg (Calculated) : 66.1  Vital Signs: Temp: 98.4 F (36.9 C) (11/28 0537) Temp src: Oral (11/28 0537) BP: 129/64 mmHg (11/28 0537) Pulse Rate: 80 (11/28 0537) Intake/Output from previous day: 11/27 0701 - 11/28 0700 In: 4210.1 [P.O.:600; I.V.:1420; Blood:325; IV Piggyback:50; TPN:1815.1] Out: 4000 [Urine:2050; [id number]  Labs:  Recent Labs  06/01/13 0535 06/01/13 0605 06/02/13 0510 06/03/13 0625  WBC 16.2*  --  14.0* 13.6*  HGB 7.6*  --  7.9* 9.4*  PLT 323  --  303 286  LABCREA  --  108.7  --   --   CREATININE 3.50*  --  3.54* 3.28*   Estimated Creatinine Clearance: 23.2 ml/min (by C-G formula based on Cr of 3.28).  Microbiology: No results found for this or any previous visit (from the past 720 hour(s)).  Medical History: Past Medical History  Diagnosis Date  . Gout   . Hypertension   . Hyperplastic colon polyp   . Diverticulosis   . Anemia   . Arthritis   . ED (erectile dysfunction)   . Chronic renal insufficiency, stage II (mild)   . rectal ca dx'd 02/01/13    rectal  . History of radiation therapy 02/21/13-03/31/13    rectum 50.4Gy total dose    Medications:  Anti-infectives   Start     Dose/Rate Route Frequency Ordered Stop   06/01/13 1600  piperacillin-tazobactam (ZOSYN) IVPB 2.25 g     2.25 g 100 mL/hr over 30 Minutes Intravenous 4 times per day 06/01/13 1024     05/31/13 1300  piperacillin-tazobactam (ZOSYN) IVPB 3.375 g  Status:  Discontinued     3.375 g 12.5 mL/hr over 240 Minutes Intravenous 3 times per day 05/31/13 1212 06/01/13 1025   05/21/13 0200  cefoTEtan (CEFOTAN) 2 g in dextrose 5 % 50 mL IVPB      2 g 100 mL/hr over 30 Minutes Intravenous Every 12 hours 05/20/13 1808 05/21/13 0203   05/20/13 0551  cefoTEtan (CEFOTAN) 2 g in dextrose 5 % 50 mL IVPB     2 g 100 mL/hr over 30 Minutes Intravenous On call to O.R. 05/20/13 0551 05/20/13 1345     Assessment: 63 yoM with rectal cancer s/p lap assisted LAR with coloanal anastomosis and diverting loop ileostomy, now with post-op ileus on TNA.  Pharmacy is consulted to dose Zosyn for possible abdominal infection or UTI.  11/14 >> Cefotetan >> 11/15 11/25 >> Zosyn EI >>   Tmax: afebrile WBCs: elevated, 13.6 Renal: SCr elevated but improved - 3.28, CrCl ~ 23 ml/min CG, ~ 19 ml/min N  No cultures   Goal of Therapy:  Appropriate abx dosing, eradication of infection.   Plan:  Day #4 zosyn  Continue zosyn 2.25gm IV q6h, if SCr continues to improve will need to adjust dose  Unknown source of infection, CT abd = no abscess  Follow up renal fxn and culture results as available.  Doreene Eland, PharmD, BCPS.   Pager: [phone] 06/03/2013 8:58 AM

## 2013-06-03 NOTE — Progress Notes (Signed)
Picked up patient care from previous nurse and I agree with her assessment.  Will continue to monitor.  Flonnie Hailstone, RN

## 2013-06-03 NOTE — Progress Notes (Signed)
Patient ID: Jason [name], male   DOB: [birth year], 67 y.o.   MRN: [id number]  [facility] - Progress Note  POD# 14  Subjective: Patient comfortable.  No complaints.  Ambulated yesterday.  Tolerating diet.  On TNA.  Objective: Vital signs in last 24 hours: Temp:  [98.3 F (36.8 C)-98.8 F (37.1 C)] 98.4 F (36.9 C) (11/28 0537) Pulse Rate:  [80-93] 80 (11/28 0537) Resp:  [16-18] 18 (11/28 0537) BP: (106-129)/(57-64) 129/64 mmHg (11/28 0537) SpO2:  [100 %] 100 % (11/28 0537) Weight:  [190 lb 0.6 oz (86.2 kg)] 190 lb 0.6 oz (86.2 kg) (11/28 0601) Last BM Date: 06/02/13  Intake/Output from previous day: 11/27 0701 - 11/28 0700 In: 4210.1 [P.O.:600; I.V.:1420; Blood:325; IV Piggyback:50; TPN:1815.1] Out: 4000 [Urine:2050; [id number]  Exam: HEENT - clear, not icteric Neck - soft Chest - clear bilaterally Cor - RRR, no murmur Abd - soft, protuberant, non-tender; BS present; gas and succus in bag; wounds clear and dry Ext - no significant edema Neuro - grossly intact, no focal deficits  Lab Results:   Recent Labs  06/02/13 0510 06/03/13 0625  WBC 14.0* 13.6*  HGB 7.9* 9.4*  HCT 24.4* 28.4*  PLT 303 286     Recent Labs  06/02/13 0510 06/03/13 0625  NA 134* 138  K 3.3* 4.3  CL 92* 102  CO2 32 24  GLUCOSE 142* 128*  BUN 58* 49*  CREATININE 3.54* 3.28*  CALCIUM 8.8 8.8    Studies/Results: No results found.  Assessment / Plan: 1.  Status LAR with diverting ostomy  Encourage PO intake  Wean and discontinue TNA when PO intake adequate  Monitor ostomy output and control with iron supplement and Benefiber per Dr. Marcello Moores  Renal following closely  WBC down to 13.8 - continue to monitor on [place], MD, [facility] Office: [phone]  06/03/2013

## 2013-06-03 NOTE — Progress Notes (Signed)
Admit: 05/20/2013 LOS: 14  33M s/p colectomy and ileostomy w/ AKI, hypovolemia, metabolic alkalosis  Subjective:  2u pRBC yesterday IVFs reduced to NS @ 150/hr Weight increasing Good UOP BPs normalized Still suboptimal PO intake, remains on TPN Thirst improved, no real orthostasis today No c/o  11/27 0701 - 11/28 0700 In: 4210.1 [P.O.:600; I.V.:1420; Blood:325; IV Piggyback:50; TPN:1815.1] Out: 4000 [Urine:2050; [id number]  Filed Weights   06/01/13 1522 06/02/13 0543 06/03/13 0601  Weight: 84.097 kg (185 lb 6.4 oz) 84.2 kg (185 lb 10 oz) 86.2 kg (190 lb 0.6 oz)    Current meds: reviewed including NS @ 150/hr Current Labs: reviewed    Physical Exam:  Blood pressure 129/64, pulse 80, temperature 98.4 F (36.9 C), temperature source Oral, resp. rate 18, height 5\' 7"  (1.702 m), weight 86.2 kg (190 lb 0.6 oz), SpO2 100.00%. NAD, lying in bed RRR, nl s1s2 CTAB.  Good air mov't.  Nl wob ABD firm, nt.   No LEE Foley catheter in place No rashes/lesions Nonfocal Neuro exam  Assessment/Plan 1. AoCKD(BL SCr 1.9), nonoliguric; suspect initially hypovoemia mediated, perhaps some ATN now: SCr somewhat improved over past 24h.  UOP remains excellent.  K and HCO3 improved.  Weight is increasing and BP has normalized. IVFs reduced to NS @150  earlier today, cont at this for next 24h.  Encouraged adequate PO intake (should not be on na restriction) and once achieving this with stable ostomy output can reduce IVFs.   2. Hypokalemia: resolved 3. Metabolic AlkaosisK: improved with volume resuscitation 4. ABL Anemia: Hb inc appropraite after  2u PRBC  Pearson Grippe MD 06/03/2013, 1:06 PM   Recent Labs Lab 05/30/13 0501  06/01/13 0535 06/02/13 0510 06/03/13 0625  NA 149*  < > 140 134* 138  K 3.6  < > 3.0* 3.3* 4.3  CL 99  < > 89* 92* 102  CO2 41*  < > 39* 32 24  GLUCOSE 138*  < > 159* 142* 128*  BUN 30*  < > 58* 58* 49*  CREATININE 2.11*  < > 3.50* 3.54* 3.28*  CALCIUM 9.7  < >  9.1 8.8 8.8  PHOS 4.2  --   --  4.6 3.4  < > = values in this interval not displayed.  Recent Labs Lab 05/28/13 0445 05/30/13 0501  06/01/13 0535 06/02/13 0510 06/03/13 0625  WBC 9.6 12.8*  < > 16.2* 14.0* 13.6*  NEUTROABS 7.8* 10.9*  --   --   --   --   HGB 7.8* 7.9*  < > 7.6* 7.9* 9.4*  HCT 24.3* 25.1*  < > 24.2* 24.4* 28.4*  MCV 88.7 89.3  < > 88.0 86.8 87.4  PLT 287 339  < > 323 303 286  < > = values in this interval not displayed.  Current Facility-Administered Medications  Medication Dose Route Frequency Provider Last Rate Last Dose  . 0.9 %  sodium chloride infusion   Intravenous Continuous Estanislado Emms, MD 150 mL/hr at 06/03/13 0830    . allopurinol (ZYLOPRIM) tablet 100 mg  123XX123 mg Oral Daily Leighton Ruff, MD   123XX123 mg at 06/03/13 1032  . TPN (CLINIMIX-E) Adult   Intravenous Continuous TPN Mosetta Pigeon, RPH 40 mL/hr at 06/02/13 1741     And  . fat emulsion 20 % infusion 250 mL  250 mL Intravenous Continuous TPN Mosetta Pigeon, RPH 10 mL/hr at 06/02/13 1740 250 mL at 06/02/13 1740  . TPN (CLINIMIX-E) Adult   Intravenous Continuous TPN Osa Craver  Zeigler, RPH       And  . fat emulsion 20 % infusion 250 mL  250 mL Intravenous Continuous TPN Clovis Riley, RPH      . feeding supplement (ENSURE) (ENSURE) pudding 1 Container  1 Container Oral TID BM Leighton Ruff, MD   1 Container at 06/03/13 1000  . heparin injection 5,000 Units  5,000 Units Subcutaneous Q8H Shann Medal, MD   5,000 Units at 06/03/13 [phone]  . HYDROmorphone (DILAUDID) injection 1-2 mg  1-2 mg Intravenous Q2H PRN Shann Medal, MD   1 mg at 06/03/13 1203  . insulin aspart (novoLOG) injection 0-9 Units  0-9 Units Subcutaneous Q6H Dara Hoyer, RPH   2 Units at 06/03/13 0000  . lip balm (CARMEX) ointment   Topical PRN Shann Medal, MD      . magic mouthwash  5 mL Oral TID Estanislado Emms, MD   5 mL at 06/03/13 1032  . metoCLOPramide (REGLAN) injection 5 mg  5 mg Intravenous [id number] PRN Leighton Ruff, MD       . metoprolol (LOPRESSOR) injection 5 mg  5 mg Intravenous [id number] PRN Leighton Ruff, MD   5 mg at 05/23/13 1138  . ondansetron (ZOFRAN) injection 4 mg  4 mg Intravenous [id number] PRN Leighton Ruff, MD   4 mg at 05/26/13 1601  . piperacillin-tazobactam (ZOSYN) IVPB 2.25 g  2.25 g Intravenous Q6H Clovis Riley, RPH   2.25 g at 06/03/13 1204  . sodium chloride 0.9 % injection 10-40 mL  10-40 mL Intracatheter PRN Leighton Ruff, MD   10 mL at 06/03/13 [phone]

## 2013-06-03 NOTE — Progress Notes (Signed)
NUTRITION FOLLOW UP  Intervention:   - TPN per pharmacy - Ensure Complete BID - Pt eating 100% on low fiber diet with plans to wean off TPN. Nutrition goals met, nutrition signing off   Nutrition Dx:   Inadequate oral intake related to medical condition as evidenced by NPO status - resolved, pt eating 100% of meals on low fiber diet.    Goal:   Pt to meet >/= 90% of their estimated nutrition needs - met with PO intake   Assessment:   67 year old male with rectal cancer s/ neoadjuvant chemoRt with history of HTN, Gout, diverticulosis, chronic renal insufficiency, and anemia. Pt now 7 days s/p lap assisted LAR with coloanal anastomosis and diverting loop ileostomy. Pt with post-op ileus; PICC placed 11/20 and plan to start pt on TPN tonight. Pt has been NPO since 11/14 except for one day of clear liquids on 11/17. Pt being brought to CT scan at time of visit. Per MST report, pt denied recent weight loss and decreased appetite PTA.   S/p NGT placed 11/22 with 2L output and total output that day of 5.8L. NGT out 11/25. Nephrology following. TPN cut in half yesterday. Low fiber diet started 11/27. Met with pt who reports eating 100% of meals today including some yogurt for breakfast and some beef and soup for lunch. Requesting a snack which was ordered. Interested in getting Ensure Complete.   TPN: Clinimix E 5/15 @ 40 ml/hr and lipids @ 8 ml/hr. Provides 1066 kcal, and 48 grams protein per day. Meets 59% minimum estimated energy needs and 48% minimum estimated protein needs.  - Triglycerides elevated but trending down - BUN/Cr elevated with low GFR but all improving slightly - Alk phos elevated but trending down slightly - CBGs < 150mg /dL today  CBG (last 3)   Recent Labs  06/02/13 2351 06/03/13 0543 06/03/13 1133  GLUCAP 153* 118* 110*     Height: Ht Readings from Last 1 Encounters:  05/20/13 5\' 7"  (1.702 m)    Weight Status:   Wt Readings from Last 1 Encounters:  06/03/13  190 lb 0.6 oz (86.2 kg)    Re-estimated needs:  Kcal: 1800- 2000  Protein: 100-110 grams  Fluid: 2.3-2.5 L/day   Skin: Rectal and abdominal incision, facial edema  Diet Order: Fiber Restricted   Intake/Output Summary (Last 24 hours) at 06/03/13 1428 Last data filed at 06/03/13 1300  Gross per 24 hour  Intake 3865.08 ml  Output   4200 ml  Net -334.92 ml    Last BM: 11/27 from ileostomy   Labs:   Recent Labs Lab 05/28/13 0445  05/30/13 0501  06/01/13 0535 06/02/13 0510 06/03/13 0625  NA 142  < > 149*  < > 140 134* 138  K 3.3*  < > 3.6  < > 3.0* 3.3* 4.3  CL 102  < > 99  < > 89* 92* 102  CO2 31  < > 41*  < > 39* 32 24  BUN 32*  < > 30*  < > 58* 58* 49*  CREATININE 2.17*  < > 2.11*  < > 3.50* 3.54* 3.28*  CALCIUM 9.5  < > 9.7  < > 9.1 8.8 8.8  MG 1.9  --  2.0  --   --  2.2  --   PHOS 3.6  --  4.2  --   --  4.6 3.4  GLUCOSE 136*  < > 138*  < > 159* 142* 128*  < > =  values in this interval not displayed.  CBG (last 3)   Recent Labs  06/02/13 2351 06/03/13 0543 06/03/13 1133  GLUCAP 153* 118* 110*    Scheduled Meds: . allopurinol  100 mg Oral Daily  . feeding supplement (ENSURE)  1 Container Oral TID BM  . heparin subcutaneous  5,000 Units Subcutaneous Q8H  . insulin aspart  0-9 Units Subcutaneous Q6H  . magic mouthwash  5 mL Oral TID  . piperacillin-tazobactam (ZOSYN)  IV  2.25 g Intravenous Q6H    Continuous Infusions: . sodium chloride 150 mL/hr at 06/03/13 0830  . Marland KitchenTPN (CLINIMIX-E) Adult 40 mL/hr at 06/02/13 1741   And  . fat emulsion 250 mL (06/02/13 1740)  . Marland KitchenTPN (CLINIMIX-E) Adult     And  . fat emulsion      Mikey College MS, RD, LDN [phone] Pager [phone] After Hours Pager

## 2013-06-03 NOTE — Clinical Documentation Improvement (Signed)
"  Acute protein calorie malnutrition" is documented per pn 05/26/13. Nutrition Note on 05/27/13 states pt with Obesity/BMI 31.16 kg/(m^2).   Clarification Needed  Please clarify documentation of "Acute Protein Calorie Malnutrition," or other diagnosis and document in pn or d/c summary  Best Practice Alert: According to ASPEN diagnosis of malnutrition must meet the following criteria:  Six characteristics is recommended for diagnosis of adult malnutition . insufficient energy intake . weight loss . loss of muscle mass  . loss of subcutaneous fat . localized or generalized fluid accumulation that may sometimes mask weight loss; and . diminished functional status as measured by hand grip strength  Clarification Needed    Possible Clinical Conditions?  Severe Malnutrition   Protein Calorie Malnutrition Severe Protein Calorie Malnutrition Emaciation  Cachexia    Other Condition Cannot clinically determine  Supporting Information: Risk Factors: Signs & Symptoms: Diagnostics: Treatment:  Thank You, Heloise Beecham ,RN Clinical Documentation Specialist:  [phone]  [place] Information Management

## 2013-06-03 NOTE — Consult Note (Signed)
WOC ostomy follow up Stoma type/location:  RLQ, ileostomy.  Red rubber catheter in place Stomal assessment/size: 1 3/4" round, budded from the skin nicely Peristomal assessment: intact Treatment options for stomal/peristomal skin: added 2" barrier ring to aid with seal of ostomy pouch Output high volume, liquid output Ostomy pouching: 2pc. 2 1/4" placed Education provided:  This is the second time this week patient has had leakage, today when I arrived pouch was about to explode. Pouch was completely full. I have stressed importance to the patient that he needs to empty or have assistance with emptying when the pouch is 1/3 to 1/2 full. Today's teaching session was somewhat limited as the patient was completely naked from the pouch leaking and sitting in the chair waiting to have pouch changed. When we were trying to demonstrate pouch emptying the pouch exploded and the stool went everywhere.  I proceeded to demonstrate skin care, cutting new wafer, measurement of stoma and placement of pouch.  Pt will definitely need reinforcement of this teaching and will need HHRN for assistance once at home.  He has not learned any of the care yet.  Asked staff to consistently work with him on emptying his pouch. Pointed out educational booklet to the patient today, no further teaching due to MD waiting to see patient and nursing staff trying to clean room, bed, patient.   [place] team will continue to follow up for ostomy care and teaching.  Polo Mcmartin [place] RN,CWOCN [phone]

## 2013-06-03 NOTE — Progress Notes (Signed)
OT Cancellation Note  Patient Details Name: Jason [name] MRN: [id number] DOB: 1946/06/22   Cancelled Treatment:    Reason Eval/Treat Not Completed: Fatigue/lethargy limiting ability to participate - attempted x 2 - pt sleeping and asked to defer OT  Lucille Passy M 06/03/2013, 6:28 PM

## 2013-06-03 NOTE — Progress Notes (Signed)
Physical Therapy Treatment Patient Details Name: Jason [name] MRN: [id number] DOB: 05-29-1946 Today's Date: 06/03/2013 Time: [id number] PT Time Calculation (min): 23 min  PT Assessment / Plan / Recommendation  History of Present Illness Patient is a 67 y/o male admitted for colectomy with diverting colostomy due to rectal cancer.   PT Comments   Pt progressing well with mobility and close to meeting goals.  Will attempt no assistive next visit.  Follow Up Recommendations  Home health PT     Does the patient have the potential to tolerate intense rehabilitation     Barriers to Discharge        Equipment Recommendations  Rolling walker with 5" wheels (possibly RW if pt does not own already)    Recommendations for Other Services    Frequency Min 3X/week   Progress towards PT Goals Progress towards PT goals: Progressing toward goals  Plan Current plan remains appropriate    Precautions / Restrictions Precautions Precautions: Fall Restrictions Weight Bearing Restrictions: No   Pertinent Vitals/Pain 6/10 abdominal pain, stated decreased to 5/10 after session, repositioned to comfort    Mobility  Bed Mobility Bed Mobility: Supine to Sit Supine to Sit: 6: Modified independent (Device/Increase time) Transfers Transfers: Sit to Stand;Stand to Sit Sit to Stand: 5: Supervision;From chair/3-in-1;With upper extremity assist;With armrests Stand to Sit: 5: Supervision;To chair/3-in-1;With upper extremity assist;With armrests Ambulation/Gait Ambulation/Gait Assistance: 5: Supervision Ambulation Distance (Feet): 350 Feet Assistive device: Rolling walker Ambulation/Gait Assistance Details: VSS during ambulation, one verbal cue for posture Gait Pattern: Step-through pattern;Decreased stride length;Trunk flexed General Gait Details: May trial without RW next visit    Exercises General Exercises - Lower Extremity Long Arc Quad: AROM;20 reps;Seated;Both Heel Slides: AROM;Supine;Both;10  reps Hip ABduction/ADduction: AROM;20 reps;Both;Standing;Other (comment) (with bil UE support) Straight Leg Raises: AROM;Both;10 reps;Supine Hip Flexion/Marching: AROM;20 reps;Both;Standing;Other (comment) (with bil UE support) Heel Raises: AROM;Both;15 reps;Standing;Other (comment) (with bil UE support) Mini-Sqauts: AROM;Both;Limitations Mini Squats Limitations: could only perform 2 reps then states sore L anterior upper thigh so ceased this exercise Other Exercises Other Exercises: standing ham curls x15 bil LEs with bil UE support   PT Diagnosis:    PT Problem List:   PT Treatment Interventions:     PT Goals (current goals can now be found in the care plan section) Acute Rehab PT Goals PT Goal Formulation: With patient Time For Goal Achievement: 06/08/13 Potential to Achieve Goals: Good  Visit Information  Last PT Received On: 06/03/13 Assistance Needed: +1 History of Present Illness: Patient is a 67 y/o male admitted for colectomy with diverting colostomy due to rectal cancer.    Subjective Data      Cognition  Cognition Arousal/Alertness: Awake/alert Behavior During Therapy: WFL for tasks assessed/performed Overall Cognitive Status: Within Functional Limits for tasks assessed    Balance     End of Session PT - End of Session Activity Tolerance: Patient tolerated treatment well Patient left: in chair;with call bell/phone within reach   GP     Galina Haddox,KATHrine E 06/03/2013, 11:59 AM Carmelia Bake, PT, DPT 06/03/2013 Pager: [phone]

## 2013-06-03 NOTE — Progress Notes (Signed)
PARENTERAL NUTRITION CONSULT NOTE - FOLLOW UP  Pharmacy Consult for TNA Indication: Post-op Ileus  Allergies  Allergen Reactions  . Nsaids     Asked by surgeon to add this medication class as intolerance due to patients renal insufficiency.    Patient Measurements: Height: 5\' 7"  (170.2 cm) Weight: 190 lb 0.6 oz (86.2 kg) IBW/kg (Calculated) : 66.1 Adjusted Body Weight: 73kg Usual Weight: 104kg  Vital Signs: Temp: 98.4 F (36.9 C) (11/28 0537) Temp src: Oral (11/28 0537) BP: 129/64 mmHg (11/28 0537) Pulse Rate: 80 (11/28 0537)   Intake/Output from previous day: 11/27 0701 - 11/28 0700 In: 4210.1 [P.O.:600; I.V.:1420; Blood:325; IV Piggyback:50; TPN:1815.1] Out: 4000 [Urine:2050; [id number]  Labs:  Recent Labs  06/01/13 0535 06/02/13 0510 06/03/13 0625  WBC 16.2* 14.0* 13.6*  HGB 7.6* 7.9* 9.4*  HCT 24.2* 24.4* 28.4*  PLT 323 303 286    Recent Labs  06/01/13 0535 06/01/13 0605 06/02/13 0510  NA 140  --  134*  K 3.0*  --  3.3*  CL 89*  --  92*  CO2 39*  --  32  GLUCOSE 159*  --  142*  BUN 58*  --  58*  CREATININE 3.50*  --  3.54*  LABCREA  --  108.7  --   CALCIUM 9.1  --  8.8  MG  --   --  2.2  PHOS  --   --  4.6  PROT  --   --  5.9*  ALBUMIN  --   --  2.2*  AST  --   --  23  ALT  --   --  37  ALKPHOS  --   --  176*  BILITOT  --   --  0.4  TRIG  --   --  165*   Estimated Creatinine Clearance: 21.5 ml/min (by C-G formula based on Cr of 3.54).    Recent Labs  06/02/13 1738 06/02/13 2351 06/03/13 0543  GLUCAP 130* 153* 118*    Medications:  Scheduled:  . allopurinol  100 mg Oral Daily  . feeding supplement (ENSURE)  1 Container Oral TID BM  . heparin subcutaneous  5,000 Units Subcutaneous Q8H  . insulin aspart  0-9 Units Subcutaneous Q6H  . magic mouthwash  5 mL Oral TID  . piperacillin-tazobactam (ZOSYN)  IV  2.25 g Intravenous Q6H   Infusions:  . sodium chloride 200 mL/hr at 06/03/13 0307  . Marland KitchenTPN (CLINIMIX-E) Adult 40 mL/hr at  06/02/13 1741   And  . fat emulsion 250 mL (06/02/13 1740)   Insulin Requirements in the past 24 hours:   6 units SSI required - sensitive scale q4h  Nutritional Goals:   Per RD recs: 1800- 2000 kcal/day, 100-110 g/day protein, 2.3-2.5 L/day fluid  Clinimix E 5/15 at a goal rate of 3ml/hr + 20% fat emulsion at 10ml/hr to provide: 100g/day protein, 1894 Kcal/day.  Current Nutrition:   Diet: Low fiber diet  Clinimix E5/15 at 10ml/hr + 20% Lipids at 81ml/hr  IVF: NS at 161ml/hr  Assessment: 9 yoM with rectal cancer s/p lap assisted LAR with coloanal anastomosis and diverting loop ileostomy, now with post-op ileus. TNA started 11/21 since surgery does not anticipate bowel function to return over next couple of days and patient has acute protein calorie malnutrition.   Glucose - 118 -153 (slightly above goal)   Electrolytes - K = 4.3, Cl improved, Corrected Ca = 10.2, phos = 3.4  Renal function: CKD, renal following, SCr improved overnight, BUN  elevated - Protein from TPN may be contributing to increase. I/O 24h = + 210 with UOP = 2L and 2L stools.   LFTs - initially elevated but improved and remain wnl.  Alk phos slightly decreased to 176  TGs - 165 (11/27), 225 (11/22), 238 (11/24) - ok to continue lipids for now but will monitor closely.  Prealbumin - low, 9.8 (11/20), 11.3 (11/22), 14.2(11/24)  NG tube out on 11/25 AM.  No orders to replace NG tube yet.  TPN Access: Double lumen PICC placed 11/20 TPN day#: 7  Plan: At 1800 today:  Continue Clinimix E 5/15 at decreased rate of 40 ml/hr - ate well for dinner but not eaten much yet today (no breakfast and yet to order lunch)  Continue electrolytes in TPN at this time but monitor closely  20% fat emulsion at 8 ml/hr.  Watch trig with mild hypertriglyceridemia  TNA to contain standard multivitamins and trace elements.  Continue IVF per MD  Continue SSI sensitive scale q4h.   TNA lab panels on Mondays &  Thursdays.  F/u daily  Thank you for the consult.  Doreene Eland, PharmD, BCPS.   Pager: [phone] 06/03/2013 7:28 AM

## 2013-06-04 ENCOUNTER — Inpatient Hospital Stay (HOSPITAL_COMMUNITY): Payer: Medicare Other

## 2013-06-04 LAB — CBC
HCT: 27.6 % — ABNORMAL LOW (ref 39.0–52.0)
Hemoglobin: 9.2 g/dL — ABNORMAL LOW (ref 13.0–17.0)
MCH: 29.2 pg (ref 26.0–34.0)
MCV: 87.6 fL (ref 78.0–100.0)
Platelets: 285 10*3/uL (ref 150–400)
RBC: 3.15 MIL/uL — ABNORMAL LOW (ref 4.22–5.81)
RDW: 15.7 % — ABNORMAL HIGH (ref 11.5–15.5)
WBC: 14.6 10*3/uL — ABNORMAL HIGH (ref 4.0–10.5)

## 2013-06-04 LAB — GLUCOSE, CAPILLARY
Glucose-Capillary: 134 mg/dL — ABNORMAL HIGH (ref 70–99)
Glucose-Capillary: 217 mg/dL — ABNORMAL HIGH (ref 70–99)
Glucose-Capillary: 141 mg/dL — ABNORMAL HIGH (ref 70–99)

## 2013-06-04 LAB — BASIC METABOLIC PANEL
BUN: 41 mg/dL — ABNORMAL HIGH (ref 6–23)
CO2: 27 mEq/L (ref 19–32)
Chloride: 103 mEq/L (ref 96–112)
GFR calc Af Amer: 22 mL/min — ABNORMAL LOW (ref >90)
GFR calc non Af Amer: 19 mL/min — ABNORMAL LOW (ref >90)
Potassium: 4 mEq/L (ref 3.5–5.1)

## 2013-06-04 MED ORDER — CLINIMIX E/DEXTROSE (5/15) 5 % IV SOLN
INTRAVENOUS | Status: AC
  Administered 2013-06-04: 19:00:00 via INTRAVENOUS
  Filled 2013-06-04: qty 2000

## 2013-06-04 MED ORDER — FAT EMULSION 20 % IV EMUL
250.0000 mL | INTRAVENOUS | Status: AC
  Administered 2013-06-04: 19:00:00 250 mL via INTRAVENOUS
  Filled 2013-06-04: qty 250

## 2013-06-04 MED ORDER — METOCLOPRAMIDE HCL 5 MG/ML IJ SOLN
5.0000 mg | Freq: Three times a day (TID) | INTRAMUSCULAR | Status: DC
  Administered 2013-06-04 – 2013-06-07 (×9): 5 mg via INTRAVENOUS
  Filled 2013-06-04 (×2): qty 1
  Filled 2013-06-04 (×3): qty 2
  Filled 2013-06-04 (×3): qty 1
  Filled 2013-06-04: qty 2
  Filled 2013-06-04: qty 1
  Filled 2013-06-04: qty 2
  Filled 2013-06-04 (×4): qty 1

## 2013-06-04 NOTE — Progress Notes (Signed)
PARENTERAL NUTRITION CONSULT NOTE - FOLLOW UP  Pharmacy Consult for TNA Indication: Post-op Ileus  Allergies  Allergen Reactions  . Nsaids     Asked by surgeon to add this medication class as intolerance due to patients renal insufficiency.    Patient Measurements: Height: 5\' 7"  (170.2 cm) Weight: 189 lb 6 oz (85.9 kg) IBW/kg (Calculated) : 66.1 Adjusted Body Weight: 73kg Usual Weight: 104kg  Vital Signs: Temp: 98.8 F (37.1 C) (11/29 0523) Temp src: Oral (11/29 0523) BP: 122/62 mmHg (11/29 0523) Pulse Rate: 92 (11/29 0523)   Intake/Output from previous day: 11/28 0701 - 11/29 0700 In: 5911.3 [P.O.:580; I.V.:3865; IV Piggyback:250; TPN:1216.3] Out: 2400 [Urine:1600; Stool:800]  Labs:  Recent Labs  06/02/13 0510 06/03/13 0625 06/04/13 0450  WBC 14.0* 13.6* 14.6*  HGB 7.9* 9.4* 9.2*  HCT 24.4* 28.4* 27.6*  PLT 303 286 285    Recent Labs  06/02/13 0510 06/03/13 0625 06/04/13 0450  NA 134* 138 141  K 3.3* 4.3 4.0  CL 92* 102 103  CO2 32 24 27  GLUCOSE 142* 128* 139*  BUN 58* 49* 41*  CREATININE 3.54* 3.28* 3.14*  CALCIUM 8.8 8.8 9.3  MG 2.2  --  1.9  PHOS 4.6 3.4 3.6  PROT 5.9*  --   --   ALBUMIN 2.2* 2.3*  --   AST 23  --   --   ALT 37  --   --   ALKPHOS 176*  --   --   BILITOT 0.4  --   --   TRIG 165*  --   --    Estimated Creatinine Clearance: 24.2 ml/min (by C-G formula based on Cr of 3.14).    Recent Labs  06/03/13 1808 06/03/13 2329 06/04/13 0546  GLUCAP 142* 142* 134*    Medications:  Scheduled:  . allopurinol  100 mg Oral Daily  . feeding supplement (ENSURE COMPLETE)  237 mL Oral BID BM  . feeding supplement (ENSURE)  1 Container Oral TID BM  . heparin subcutaneous  5,000 Units Subcutaneous Q8H  . insulin aspart  0-9 Units Subcutaneous Q6H  . magic mouthwash  5 mL Oral TID  . metoCLOPramide (REGLAN) injection  5 mg Intravenous TID AC  . piperacillin-tazobactam (ZOSYN)  IV  2.25 g Intravenous Q6H   Infusions:  . sodium  chloride 150 mL/hr at 06/04/13 0427  . Marland KitchenTPN (CLINIMIX-E) Adult 40 mL/hr at 06/03/13 1813   And  . fat emulsion 250 mL (06/03/13 1813)   Insulin Requirements in the past 24 hours:   3 units SSI required - sensitive scale q4h  Nutritional Goals:   Per RD recs: 1800- 2000 kcal/day, 100-110 g/day protein, 2.3-2.5 L/day fluid  Clinimix E 5/15 at a goal rate of 29ml/hr + 20% fat emulsion at 53ml/hr to provide: 100g/day protein, 1894 Kcal/day.  Current Nutrition:   Diet: NPO again starting 11/29  Clinimix E5/15 at 40 ml/hr + 20% Lipids at 76ml/hr  IVF: NS at 52ml/hr  Assessment: 64 yoM with rectal cancer s/p lap assisted LAR with coloanal anastomosis and diverting loop ileostomy, now with post-op ileus. TNA started 11/21 since surgery does not anticipate bowel function to return over next couple of days and patient has acute protein calorie malnutrition.   Glucose - 110-142 (at goal <150)    Electrolytes - WNL, monitoring closely with Renal insufficiency   Renal function: CKD, renal following, SCr improved overnight, BUN elevated - Protein from TPN may be contributing to increase.   LFTs -  initially elevated but improved and remain wnl.  Alk phos trending down slightly to 176  TGs - 165 (11/27), 225 (11/22), 238 (11/24) - ok to continue lipids for now but will monitor closely.  Prealbumin - low, 9.8 (11/20), 11.3 (11/22), 14.2(11/24)  NG tube out on 11/25 AM.  No orders to replace NG tube yet.  TPN Access: Double lumen PICC placed 11/20 TPN day#: 8  Plan: At 1800 today:  Increase Clinimix E 5/15 back toward goal at 65 ml/hr today - Patient was eating well and TNA weaned down, but now with N/V and what appears to be persistent ileus, is now NPO so will advance TNA back to goal until resolution of ileus and eating again.   Continue electrolytes in TPN at this time but monitor closely  20% fat emulsion at 8 ml/hr.  Watch trig with mild hypertriglyceridemia  TNA to contain  standard multivitamins and trace elements.  Continue IVF per MD  Continue SSI sensitive scale q4h.   TNA lab panels on Mondays & Thursdays.  F/u daily   Mayzee Reichenbach, Gaye Alken PharmD Pager #: [phone] 11:32 AM 06/04/2013

## 2013-06-04 NOTE — Progress Notes (Signed)
15 Days Post-Op  Subjective: Nausea and vomiting x2 since last night.  Ambulating well  Objective: Vital signs in last 24 hours: Temp:  [98.8 F (37.1 C)-99.5 F (37.5 C)] 98.8 F (37.1 C) (11/29 0523) Pulse Rate:  [92-106] 92 (11/29 0523) Resp:  [18-20] 18 (11/29 0523) BP: (122-134)/(60-75) 122/62 mmHg (11/29 0523) SpO2:  [99 %-100 %] 100 % (11/29 0523) Weight:  [189 lb 6 oz (85.9 kg)] 189 lb 6 oz (85.9 kg) (11/29 0523) Last BM Date: 06/03/13  Intake/Output from previous day: 11/28 0701 - 11/29 0700 In: 5911.3 [P.O.:580; I.V.:3865; IV Piggyback:250; TPN:1216.3] Out: 2400 [Urine:1600; Stool:800] Intake/Output this shift: Total I/O In: 120 [P.O.:120] Out: -   General appearance: alert, cooperative and no distress Resp: nonlabored GI: soft, NT, moderate distension, wounds okay, ostomy actually functioning well with bilious output, no peritoneal signs  Lab Results:   Recent Labs  06/03/13 0625 06/04/13 0450  WBC 13.6* 14.6*  HGB 9.4* 9.2*  HCT 28.4* 27.6*  PLT 286 285   BMET  Recent Labs  06/03/13 0625 06/04/13 0450  NA 138 141  K 4.3 4.0  CL 102 103  CO2 24 27  GLUCOSE 128* 139*  BUN 49* 41*  CREATININE 3.28* 3.14*  CALCIUM 8.8 9.3   PT/INR No results found for this basename: LABPROT, INR,  in the last 72 hours ABG No results found for this basename: PHART, PCO2, PO2, HCO3,  in the last 72 hours  Studies/Results: No results found.  Anti-infectives: Anti-infectives   Start     Dose/Rate Route Frequency Ordered Stop   06/01/13 1600  piperacillin-tazobactam (ZOSYN) IVPB 2.25 g     2.25 g 100 mL/hr over 30 Minutes Intravenous 4 times per day 06/01/13 1024     05/31/13 1300  piperacillin-tazobactam (ZOSYN) IVPB 3.375 g  Status:  Discontinued     3.375 g 12.5 mL/hr over 240 Minutes Intravenous 3 times per day 05/31/13 1212 06/01/13 1025   05/21/13 0200  cefoTEtan (CEFOTAN) 2 g in dextrose 5 % 50 mL IVPB     2 g 100 mL/hr over 30 Minutes Intravenous  Every 12 hours 05/20/13 1808 05/21/13 0203   05/20/13 0551  cefoTEtan (CEFOTAN) 2 g in dextrose 5 % 50 mL IVPB     2 g 100 mL/hr over 30 Minutes Intravenous On call to O.R. 05/20/13 0551 05/20/13 1345      Assessment/Plan: s/p Procedure(s): LAPAROSCOPIC LOW ANTERIOR RESECTION WITH SPLENIC FLEXURE MOBILIZATION (N/A) diverting OSTOMY (N/A) Keep NPO for now and check abdominal xrays given distension and vomiting.  He says that he feels pretty good but appears to have persistent ileus  LOS: 15 days    Madilyn Hook DAVID 06/04/2013

## 2013-06-04 NOTE — Progress Notes (Signed)
Spoke to patient at length regarding insertion of NG tube. Informed I spoke with Dr. Lilyan Punt and his recommendation remains to insert NG tube. Pt continues to decline, stating that he feels better and is not nauseated. He is pleased that his ileostomy is having more output. Ambulated in hallway again with SBA and walker. States he will agree to NG if vomiting returns.

## 2013-06-04 NOTE — Progress Notes (Signed)
Pt had episode of nausea and large amount of emesis. PRN Zofran given and nausea relieved. Will continue to monitor. Marvin Grabill, Bing Neighbors, RN

## 2013-06-04 NOTE — Progress Notes (Signed)
Assessment:  1 Non-oliguric Acute Kidney Injury, hemodynamically mediated  2 Stage 3 CKD  3 Anemia due to ABL  Plan:   cont IVFs, slowly decrease rate    Subjective: Interval History: Emesis today. Ambulating aggressively in hallways  Objective: Vital signs in last 24 hours: Temp:  [98.1 F (36.7 C)-99.5 F (37.5 C)] 98.1 F (36.7 C) 07/02/2023 1359) Pulse Rate:  [92-106] 96 07/02/2023 1359) Resp:  [18-19] 18 2023/07/02 1359) BP: (122-138)/(62-75) 138/62 mmHg 07/02/2023 1359) SpO2:  [100 %] 100 % 07-02-23 1359) Weight:  [85.9 kg (189 lb 6 oz)] 85.9 kg (189 lb 6 oz) 07/02/2023 0523) Weight change: -0.3 kg (-10.6 oz)  Intake/Output from previous day: 11/28 0701 - 2023/07/02 0700 In: 5911.3 [P.O.:580; I.V.:3865; IV Piggyback:250; TPN:1216.3] Out: 2400 [Urine:1600; Stool:800] Intake/Output this shift: Total I/O In: 120 [P.O.:120] Out: 1350 [Urine:700; Stool:650]  General appearance: alert and cooperative facial fullness Head: Normocephalic, without obvious abnormality, atraumatic Lungs clear Cor RRR Ext no edema  Lab Results:  Recent Labs  06/03/13 0625 07/01/13 0450  WBC 13.6* 14.6*  HGB 9.4* 9.2*  HCT 28.4* 27.6*  PLT 286 285   BMET:  Recent Labs  06/03/13 0625 Jul 01, 2013 0450  NA 138 141  K 4.3 4.0  CL 102 103  CO2 24 27  GLUCOSE 128* 139*  BUN 49* 41*  CREATININE 3.28* 3.14*  CALCIUM 8.8 9.3   No results found for this basename: PTH,  in the last 72 hours Iron Studies: No results found for this basename: IRON, TIBC, TRANSFERRIN, FERRITIN,  in the last 72 hours Studies/Results: Dg Abd 2 Views  2013/07/01   CLINICAL DATA:  Abdominal pain and distension.  EXAM: ABDOMEN - 2 VIEW  COMPARISON:  05/31/2013  FINDINGS: The drain has been removed. No clear evidence for free air. There appears to be a small amount of air in the stomach. There are dilated loops of small bowel throughout the mid abdomen with air-fluid levels. There is ostomy in the right lower abdomen.  IMPRESSION: Small  bowel distention with air-fluid levels. Findings are suggestive for postoperative ileus versus obstruction. Small bowel distention has slightly progressed since 05/31/2013.   Electronically Signed   By: Markus Daft M.D.   On: 07/01/2013 12:38   Scheduled: . allopurinol  100 mg Oral Daily  . feeding supplement (ENSURE COMPLETE)  237 mL Oral BID BM  . feeding supplement (ENSURE)  1 Container Oral TID BM  . heparin subcutaneous  5,000 Units Subcutaneous Q8H  . insulin aspart  0-9 Units Subcutaneous Q6H  . magic mouthwash  5 mL Oral TID  . metoCLOPramide (REGLAN) injection  5 mg Intravenous TID AC  . piperacillin-tazobactam (ZOSYN)  IV  2.25 g Intravenous Q6H    LOS: 15 days   Alem Fahl C Jul 01, 2013,3:35 PM

## 2013-06-04 NOTE — Progress Notes (Signed)
Call to Dr. Lilyan Punt as patient request that NG not be placed until he becomes nauseous again. He looks remarkable well, and has ambulated in the hallway with SBA x2. Output from ileostomy has been 175 cc and 475cc. Pt states "I understand what the NG does but would rather wait since I feel so much better".

## 2013-06-04 NOTE — Progress Notes (Signed)
Pt with episode of emesis. Writer had given Reglan and Zofran before giving Dilaudid. Will make MD aware. Empted 175cc Green liquid from ileostomy.

## 2013-06-05 LAB — BASIC METABOLIC PANEL
BUN: 37 mg/dL — ABNORMAL HIGH (ref 6–23)
Calcium: 9.9 mg/dL (ref 8.4–10.5)
GFR calc Af Amer: 22 mL/min — ABNORMAL LOW (ref >90)
GFR calc non Af Amer: 19 mL/min — ABNORMAL LOW (ref >90)
Glucose, Bld: 101 mg/dL — ABNORMAL HIGH (ref 70–99)
Potassium: 4.1 mEq/L (ref 3.5–5.1)
Sodium: 140 mEq/L (ref 135–145)

## 2013-06-05 LAB — GLUCOSE, CAPILLARY
Glucose-Capillary: 104 mg/dL — ABNORMAL HIGH (ref 70–99)
Glucose-Capillary: 94 mg/dL (ref 70–99)
Glucose-Capillary: 148 mg/dL — ABNORMAL HIGH (ref 70–99)

## 2013-06-05 LAB — PHOSPHORUS: Phosphorus: 4 mg/dL (ref 2.3–4.6)

## 2013-06-05 LAB — MAGNESIUM: Magnesium: 1.8 mg/dL (ref 1.5–2.5)

## 2013-06-05 MED ORDER — PIPERACILLIN-TAZOBACTAM 3.375 G IVPB
3.3750 g | Freq: Three times a day (TID) | INTRAVENOUS | Status: DC
  Administered 2013-06-05 – 2013-06-10 (×15): 3.375 g via INTRAVENOUS
  Filled 2013-06-05 (×17): qty 50

## 2013-06-05 MED ORDER — DEXTROSE 10 % IV SOLN
INTRAVENOUS | Status: AC
  Administered 2013-06-05: 11:00:00 via INTRAVENOUS
  Filled 2013-06-05 (×2): qty 1000

## 2013-06-05 MED ORDER — FAT EMULSION 20 % IV EMUL
250.0000 mL | INTRAVENOUS | Status: AC
  Administered 2013-06-05: 250 mL via INTRAVENOUS
  Filled 2013-06-05: qty 250

## 2013-06-05 MED ORDER — TRACE MINERALS CR-CU-F-FE-I-MN-MO-SE-ZN IV SOLN
INTRAVENOUS | Status: AC
  Administered 2013-06-05: 18:00:00 via INTRAVENOUS
  Filled 2013-06-05: qty 2000

## 2013-06-05 MED ORDER — INSULIN ASPART 100 UNIT/ML ~~LOC~~ SOLN
0.0000 [IU] | SUBCUTANEOUS | Status: DC
  Administered 2013-06-05 – 2013-06-19 (×25): 2 [IU] via SUBCUTANEOUS
  Administered 2013-06-20: 3 [IU] via SUBCUTANEOUS
  Administered 2013-06-20: 2 [IU] via SUBCUTANEOUS

## 2013-06-05 NOTE — Progress Notes (Signed)
Found TNA tubing on floor.  Unknown how lonf disconneccted.  Sarah,RN aware and to check blood sugar.  Brandon Melnick, RN

## 2013-06-05 NOTE — Progress Notes (Signed)
Assessment:  1 Non-oliguric Acute Kidney Injury, hemodynamically mediated  2 Stage 3 CKD  3 Anemia due to ABL   Plan:  cont IVF and increase NS to 100cc/hr plus TNA with ongoing GI issues; d/c foley  Subjective: Interval History: c/o abd fullness  Objective: Vital signs in last 24 hours: Temp:  [98.1 F (36.7 C)-99.4 F (37.4 C)] 98.8 F (37.1 C) (11/30 0524) Pulse Rate:  [96-100] 100 (11/30 0524) Resp:  [18-20] 20 (11/30 0524) BP: (124-138)/(62-66) 127/64 mmHg (11/30 0524) SpO2:  [100 %] 100 % (11/30 0524) Weight:  [86.047 kg (189 lb 11.2 oz)] 86.047 kg (189 lb 11.2 oz) (11/30 0524) Weight change: 0.147 kg (5.2 oz)  Intake/Output from previous day: Jul 02, 2023 0701 - 11/30 0700 In: 3150.8 [P.O.:120; I.V.:1349.2; IV Piggyback:200; TPN:1481.6] Out: 3350 [Urine:1675; [id number] Intake/Output this shift: Total I/O In: -  Out: 250 [Stool:250]  General appearance: alert and cooperative Resp: clear to auscultation bilaterally Cardio: regular rate and rhythm, S1, S2 normal, no murmur, click, rub or gallop GI: protuberant, post op, rlq ostomy bag Extremities: extremities normal, atraumatic, no cyanosis or edema  Lab Results:  Recent Labs  06/03/13 0625 July 01, 2013 0450  WBC 13.6* 14.6*  HGB 9.4* 9.2*  HCT 28.4* 27.6*  PLT 286 285   BMET:  Recent Labs  01-Jul-2013 0450 06/05/13 0515  NA 141 140  K 4.0 4.1  CL 103 101  CO2 27 27  GLUCOSE 139* 101*  BUN 41* 37*  CREATININE 3.14* 3.15*  CALCIUM 9.3 9.9   No results found for this basename: PTH,  in the last 72 hours Iron Studies: No results found for this basename: IRON, TIBC, TRANSFERRIN, FERRITIN,  in the last 72 hours Studies/Results: Dg Abd 2 Views  07/01/2013   CLINICAL DATA:  Abdominal pain and distension.  EXAM: ABDOMEN - 2 VIEW  COMPARISON:  05/31/2013  FINDINGS: The drain has been removed. No clear evidence for free air. There appears to be a small amount of air in the stomach. There are dilated loops of small  bowel throughout the mid abdomen with air-fluid levels. There is ostomy in the right lower abdomen.  IMPRESSION: Small bowel distention with air-fluid levels. Findings are suggestive for postoperative ileus versus obstruction. Small bowel distention has slightly progressed since 05/31/2013.   Electronically Signed   By: Markus Daft M.D.   On: 01-Jul-2013 12:38   Scheduled: . allopurinol  100 mg Oral Daily  . feeding supplement (ENSURE COMPLETE)  237 mL Oral BID BM  . feeding supplement (ENSURE)  1 Container Oral TID BM  . heparin subcutaneous  5,000 Units Subcutaneous Q8H  . insulin aspart  0-15 Units Subcutaneous Q4H  . magic mouthwash  5 mL Oral TID  . metoCLOPramide (REGLAN) injection  5 mg Intravenous TID AC  . piperacillin-tazobactam (ZOSYN)  IV  3.375 g Intravenous Q8H     LOS: 16 days   Charlayne Vultaggio C 06/05/2013,1:36 PM

## 2013-06-05 NOTE — Progress Notes (Addendum)
16 Days Post-Op  Subjective: Asking for something to drink. Denies pain.  Had some nausea but no vomiting  Objective: Vital signs in last 24 hours: Temp:  [98.1 F (36.7 C)-99.4 F (37.4 C)] 98.8 F (37.1 C) (11/30 0524) Pulse Rate:  [96-100] 100 (11/30 0524) Resp:  [18-20] 20 (11/30 0524) BP: (124-138)/(62-66) 127/64 mmHg (11/30 0524) SpO2:  [100 %] 100 % (11/30 0524) Weight:  [189 lb 11.2 oz (86.047 kg)] 189 lb 11.2 oz (86.047 kg) (11/30 0524) Last BM Date: 06/03/13  Intake/Output from previous day: 2023-06-14 0701 - 11/30 0700 In: 3150.8 [P.O.:120; I.V.:1349.2; IV Piggyback:200; TPN:1481.6] Out: 3350 [id number]; [id number] Intake/Output this shift:    General appearance: alert, cooperative and no distress Resp: nonlabored GI: soft, NT, moderate distention and tympany, no peritoneal signs.  ostomy with thin green output.  Lab Results:   Recent Labs  06/03/13 0625 06/13/2013 0450  WBC 13.6* 14.6*  HGB 9.4* 9.2*  HCT 28.4* 27.6*  PLT 286 285   BMET  Recent Labs  06-13-13 0450 06/05/13 0515  NA 141 140  K 4.0 4.1  CL 103 101  CO2 27 27  GLUCOSE 139* 101*  BUN 41* 37*  CREATININE 3.14* 3.15*  CALCIUM 9.3 9.9   PT/INR No results found for this basename: LABPROT, INR,  in the last 72 hours ABG No results found for this basename: PHART, PCO2, PO2, HCO3,  in the last 72 hours  Studies/Results: Dg Abd 2 Views  06-13-13   CLINICAL DATA:  Abdominal pain and distension.  EXAM: ABDOMEN - 2 VIEW  COMPARISON:  05/31/2013  FINDINGS: The drain has been removed. No clear evidence for free air. There appears to be a small amount of air in the stomach. There are dilated loops of small bowel throughout the mid abdomen with air-fluid levels. There is ostomy in the right lower abdomen.  IMPRESSION: Small bowel distention with air-fluid levels. Findings are suggestive for postoperative ileus versus obstruction. Small bowel distention has slightly progressed since 05/31/2013.    Electronically Signed   By: Markus Daft M.D.   On: 06/13/2013 12:38    Anti-infectives: Anti-infectives   Start     Dose/Rate Route Frequency Ordered Stop   06/01/13 1600  piperacillin-tazobactam (ZOSYN) IVPB 2.25 g     2.25 g 100 mL/hr over 30 Minutes Intravenous 4 times per day 06/01/13 1024     05/31/13 1300  piperacillin-tazobactam (ZOSYN) IVPB 3.375 g  Status:  Discontinued     3.375 g 12.5 mL/hr over 240 Minutes Intravenous 3 times per day 05/31/13 1212 06/01/13 1025   05/21/13 0200  cefoTEtan (CEFOTAN) 2 g in dextrose 5 % 50 mL IVPB     2 g 100 mL/hr over 30 Minutes Intravenous Every 12 hours 05/20/13 1808 05/21/13 0203   05/20/13 0551  cefoTEtan (CEFOTAN) 2 g in dextrose 5 % 50 mL IVPB     2 g 100 mL/hr over 30 Minutes Intravenous On call to O.R. 05/20/13 0551 05/20/13 1345      Assessment/Plan: s/p Procedure(s): LAPAROSCOPIC LOW ANTERIOR RESECTION WITH SPLENIC FLEXURE MOBILIZATION (N/A) diverting OSTOMY (N/A) Refused NG tube yesterday and asking for food/drinks today.  I would not recommend this given his abdominal distention.  His ostomy is working but I would prefer waiting until the distention is resolved prior to advancing diet.  LOS: 16 days    Jason [name] Jason [name] 06/05/2013 IV fluid rate at 63ml/hr but he is NPO.  I was planning on increasing fluid rate while  NPO (although uop is adequate and cr stable), but will leave this for nephrology to adjust rate if needed.

## 2013-06-05 NOTE — Progress Notes (Addendum)
PARENTERAL NUTRITION CONSULT NOTE - FOLLOW UP  Pharmacy Consult for TNA Indication: Post-op Ileus  Allergies  Allergen Reactions  . Nsaids     Asked by surgeon to add this medication class as intolerance due to patients renal insufficiency.    Patient Measurements: Height: 5\' 7"  (170.2 cm) Weight: 189 lb 11.2 oz (86.047 kg) IBW/kg (Calculated) : 66.1 Adjusted Body Weight: 73kg Usual Weight: 104kg  Vital Signs: Temp: 98.8 F (37.1 C) (11/30 0524) Temp src: Oral (11/30 0524) BP: 127/64 mmHg (11/30 0524) Pulse Rate: 100 (11/30 0524)   Intake/Output from previous day: 11/29 0701 - 11/30 0700 In: 3150.8 [P.O.:120; I.V.:1349.2; IV Piggyback:200; [id number] Out: 3350 [id number]; [id number]  Labs:  Recent Labs  06/03/13 0625 06/04/13 0450  WBC 13.6* 14.6*  HGB 9.4* 9.2*  HCT 28.4* 27.6*  PLT 286 285    Recent Labs  06/03/13 0625 06/04/13 0450 06/05/13 0515  NA 138 141 140  K 4.3 4.0 4.1  CL 102 103 101  CO2 24 27 27   GLUCOSE 128* 139* 101*  BUN 49* 41* 37*  CREATININE 3.28* 3.14* 3.15*  CALCIUM 8.8 9.3 9.9  MG  --  1.9 1.8  PHOS 3.4 3.6 4.0  ALBUMIN 2.3*  --   --    Estimated Creatinine Clearance: 24.2 ml/min (by C-G formula based on Cr of 3.15).    Recent Labs  06/04/13 1802 06/05/13 0004 06/05/13 0518  GLUCAP 141* 125* 104*    Medications:  Scheduled:  . allopurinol  100 mg Oral Daily  . feeding supplement (ENSURE COMPLETE)  237 mL Oral BID BM  . feeding supplement (ENSURE)  1 Container Oral TID BM  . heparin subcutaneous  5,000 Units Subcutaneous Q8H  . insulin aspart  0-9 Units Subcutaneous Q6H  . magic mouthwash  5 mL Oral TID  . metoCLOPramide (REGLAN) injection  5 mg Intravenous TID AC  . piperacillin-tazobactam (ZOSYN)  IV  2.25 g Intravenous Q6H   Infusions:  . sodium chloride 50 mL/hr at 06/05/13 1039  . dextrose 65 mL/hr at 06/05/13 1039  . Marland KitchenTPN (CLINIMIX-E) Adult 65 mL/hr at 06/04/13 1830   And  . fat emulsion 250 mL  (06/04/13 1830)   Insulin Requirements in the past 24 hours:   6 units SSI required - sensitive scale q4h  Nutritional Goals:   Per RD recs: 1800- 2000 kcal/day, 100-110 g/day protein, 2.3-2.5 L/day fluid  Clinimix E 5/15 at a goal rate of 74ml/hr + 20% fat emulsion at 67ml/hr to provide: 100g/day protein, 1894 Kcal/day.  Current Nutrition:   Diet: NPO again starting 11/29  Clinimix E5/15 at 40 ml/hr + 20% Lipids at 65ml/hr  IVF: NS at 55ml/hr  Assessment: 75 yoM with rectal cancer s/p lap assisted LAR with coloanal anastomosis and diverting loop ileostomy, now with post-op ileus. TNA started 11/21 since surgery does not anticipate bowel function to return over next couple of days and patient has acute protein calorie malnutrition.   Glucose - 104-217 (goal <150) - will increase to moderate SSI  Electrolytes - WNL, monitoring closely with Renal insufficiency   Renal function: CKD stage III, renal following, SCr stable at 3.15,  BUN elevated - Protein from TPN may be contributing to increase.  Nephrology following, Plan is to continue IVF's and slowly decrease rate - Note patient is currently receiving a total of 115 ml/hr with MIVF + TNA, will follow with nephrology to manage fluids   LFTs - (from 11/27) initially elevated but improved and remain wnl.  Alk phos trending down slightly to 176  TGs - 165 (11/27), 225 (11/22), 238 (11/24) - ok to continue lipids for now but will monitor closely.  Prealbumin - low, 9.8 (11/20), 11.3 (11/22), 14.2(11/24)  11/30: Monitor fluids and electrolytes closely w/ CKD Stage III.  Patient refusing NG tuve and asking for food/drinks today - Surgery recommends continue NPO given abdominal distention, wait until distention resolved prior to advancing diet.  **TNA tubing found on floor this AM around 0500 - D10 at 65 ml/hr will run until TNA can be restarted at 1800 tonight  TPN Access: Double lumen PICC placed 11/20 TPN day#: 8  Plan: At 1800  today:  Continue Clinimix E 5/15 at 65 ml/hr today - Patient was eating well and TNA weaned down to 40 mlhr, now with persistent ileus, abdominal distension today and holding on advancing diet, continue TNA at current rate as CBGs above goal range when the TNA was running  Continue electrolytes in TPN at this time but monitor closely  20% fat emulsion at 8 ml/hr.  Watch trig with mild hypertriglyceridemia  TNA to contain standard multivitamins and trace elements.  Continue IVF per MD  Change SSI to moderate scale q4h.   TNA lab panels on Mondays & Thursdays.  F/u daily   Senna Lape, Gaye Alken PharmD Pager #: [phone] 11:10 AM 06/05/2013

## 2013-06-05 NOTE — Progress Notes (Signed)
Patient ambulated in hallway >200 feet with walker and minimal assistance. Patient tolerated well. No complains of nausea. Will continue to monitor patient. Setzer, Marchelle Folks

## 2013-06-05 NOTE — Progress Notes (Signed)
Pharmacy notified of TNA incident from this am. D10 hung in place until this pm. Will continue to monitor patient. Setzer, Marchelle Folks

## 2013-06-05 NOTE — Progress Notes (Signed)
ANTIBIOTIC CONSULT NOTE   Pharmacy Consult for Zosyn Indication: Empiric coverage (possible abdominal or UTI)  Allergies  Allergen Reactions  . Nsaids     Asked by surgeon to add this medication class as intolerance due to patients renal insufficiency.    Patient Measurements: Height: 5\' 7"  (170.2 cm) Weight: 189 lb 11.2 oz (86.047 kg) IBW/kg (Calculated) : 66.1  Vital Signs: Temp: 98.8 F (37.1 C) (11/30 0524) Temp src: Oral (11/30 0524) BP: 127/64 mmHg (11/30 0524) Pulse Rate: 100 (11/30 0524) Intake/Output from previous day: 11/29 0701 - 11/30 0700 In: 3150.8 [P.O.:120; I.V.:1349.2; IV Piggyback:200; [id number] Out: 3350 [id number]; [id number]  Labs:  Recent Labs  06/03/13 0625 06/04/13 0450 06/05/13 0515  WBC 13.6* 14.6*  --   HGB 9.4* 9.2*  --   PLT 286 285  --   CREATININE 3.28* 3.14* 3.15*   Estimated Creatinine Clearance: 24.2 ml/min (by C-G formula based on Cr of 3.15).  Microbiology: No results found for this or any previous visit (from the past 720 hour(s)).  Medical History: Past Medical History  Diagnosis Date  . Gout   . Hypertension   . Hyperplastic colon polyp   . Diverticulosis   . Anemia   . Arthritis   . ED (erectile dysfunction)   . Chronic renal insufficiency, stage II (mild)   . rectal ca dx'd 02/01/13    rectal  . History of radiation therapy 02/21/13-03/31/13    rectum 50.4Gy total dose    Medications:  Anti-infectives   Start     Dose/Rate Route Frequency Ordered Stop   06/01/13 1600  piperacillin-tazobactam (ZOSYN) IVPB 2.25 g     2.25 g 100 mL/hr over 30 Minutes Intravenous 4 times per day 06/01/13 1024     05/31/13 1300  piperacillin-tazobactam (ZOSYN) IVPB 3.375 g  Status:  Discontinued     3.375 g 12.5 mL/hr over 240 Minutes Intravenous 3 times per day 05/31/13 1212 06/01/13 1025   05/21/13 0200  cefoTEtan (CEFOTAN) 2 g in dextrose 5 % 50 mL IVPB     2 g 100 mL/hr over 30 Minutes Intravenous Every 12 hours 05/20/13  1808 05/21/13 0203   05/20/13 0551  cefoTEtan (CEFOTAN) 2 g in dextrose 5 % 50 mL IVPB     2 g 100 mL/hr over 30 Minutes Intravenous On call to O.R. 05/20/13 0551 05/20/13 1345     Assessment: 68 yoM with rectal cancer s/p lap assisted LAR with coloanal anastomosis and diverting loop ileostomy, now with post-op ileus on TNA.  Pharmacy is consulted to dose Zosyn for possible abdominal infection or UTI.  11/14 >> Cefotetan >> 11/15 11/25 >> Zosyn EI >>   Tmax: afebrile WBCs: elevated, 14.6 Renal CKD Stage III, Nephrology following.  Scr stable/elevated at 3.15  No cultures   Goal of Therapy:  Appropriate abx dosing, eradication of infection.   Plan:  Day # 6 zosyn  Increase Zosyn to 3.375 grams IV q8h, infuse over 4 hours  Unknown source of infection, CT abd = no abscess  Follow up renal fxn and culture results as available.  F/u Length of therapy with Zosyn planned   Glee Arvin PharmD Pager #: [phone] 12:01 PM 06/05/2013

## 2013-06-06 ENCOUNTER — Other Ambulatory Visit: Payer: Self-pay | Admitting: *Deleted

## 2013-06-06 ENCOUNTER — Inpatient Hospital Stay (HOSPITAL_COMMUNITY): Payer: Medicare Other

## 2013-06-06 LAB — DIFFERENTIAL
Basophils Relative: 0 % (ref 0–1)
Lymphocytes Relative: 8 % — ABNORMAL LOW (ref 12–46)
Lymphs Abs: 1 10*3/uL (ref 0.7–4.0)
Monocytes Absolute: 1.1 10*3/uL — ABNORMAL HIGH (ref 0.1–1.0)
Monocytes Relative: 9 % (ref 3–12)
Neutro Abs: 9.8 10*3/uL — ABNORMAL HIGH (ref 1.7–7.7)
Neutrophils Relative %: 81 % — ABNORMAL HIGH (ref 43–77)

## 2013-06-06 LAB — CBC
Hemoglobin: 9.2 g/dL — ABNORMAL LOW (ref 13.0–17.0)
MCHC: 32.3 g/dL (ref 30.0–36.0)
Platelets: 281 10*3/uL (ref 150–400)
RBC: 3.23 MIL/uL — ABNORMAL LOW (ref 4.22–5.81)
WBC: 12.1 10*3/uL — ABNORMAL HIGH (ref 4.0–10.5)

## 2013-06-06 LAB — COMPREHENSIVE METABOLIC PANEL
ALT: 36 U/L (ref 0–53)
Albumin: 2.6 g/dL — ABNORMAL LOW (ref 3.5–5.2)
BUN: 38 mg/dL — ABNORMAL HIGH (ref 6–23)
Calcium: 9.9 mg/dL (ref 8.4–10.5)
Creatinine, Ser: 3.18 mg/dL — ABNORMAL HIGH (ref 0.50–1.35)
Potassium: 3.9 mEq/L (ref 3.5–5.1)
Total Protein: 6.8 g/dL (ref 6.0–8.3)

## 2013-06-06 LAB — GLUCOSE, CAPILLARY
Glucose-Capillary: 106 mg/dL — ABNORMAL HIGH (ref 70–99)
Glucose-Capillary: 115 mg/dL — ABNORMAL HIGH (ref 70–99)
Glucose-Capillary: 138 mg/dL — ABNORMAL HIGH (ref 70–99)
Glucose-Capillary: 142 mg/dL — ABNORMAL HIGH (ref 70–99)
Glucose-Capillary: 147 mg/dL — ABNORMAL HIGH (ref 70–99)
Glucose-Capillary: 149 mg/dL — ABNORMAL HIGH (ref 70–99)

## 2013-06-06 LAB — PHOSPHORUS: Phosphorus: 4.4 mg/dL (ref 2.3–4.6)

## 2013-06-06 LAB — MAGNESIUM: Magnesium: 2 mg/dL (ref 1.5–2.5)

## 2013-06-06 LAB — PREALBUMIN: Prealbumin: 18.1 mg/dL (ref 17.0–34.0)

## 2013-06-06 LAB — TRIGLYCERIDES: Triglycerides: 179 mg/dL — ABNORMAL HIGH (ref <150)

## 2013-06-06 MED ORDER — FAT EMULSION 20 % IV EMUL
250.0000 mL | INTRAVENOUS | Status: AC
  Administered 2013-06-06: 17:00:00 250 mL via INTRAVENOUS
  Filled 2013-06-06: qty 250

## 2013-06-06 MED ORDER — TRACE MINERALS CR-CU-F-FE-I-MN-MO-SE-ZN IV SOLN
INTRAVENOUS | Status: AC
  Administered 2013-06-06: 17:00:00 via INTRAVENOUS
  Filled 2013-06-06: qty 2000

## 2013-06-06 NOTE — Progress Notes (Signed)
17 Days Post-Op  Subjective: Feels fine. Denies pain. Denies nausea  Objective: Vital signs in last 24 hours: Temp:  [97.7 F (36.5 C)-99.2 F (37.3 C)] 99.2 F (37.3 C) (12/01 0431) Pulse Rate:  [92-96] 92 (12/01 0431) Resp:  [18-20] 20 (12/01 0431) BP: (126-127)/(70-76) 127/76 mmHg (12/01 0431) SpO2:  [100 %] 100 % (12/01 0431) Weight:  [185 lb 9.6 oz (84.188 kg)] 185 lb 9.6 oz (84.188 kg) (12/01 0431) Last BM Date: 06/03/13  Intake/Output from previous day: 11/30 0701 - 12/01 0700 In: 1044.3 [I.V.:994.3; IV Piggyback:50] Out: [id number] [Urine:1525; Stool:775] Intake/Output this shift:    General appearance: alert, cooperative and no distress Resp: nonlabored GI: soft, NT, moderate distention and tympany, no peritoneal signs.  ostomy with thin green output.  Lab Results:   Recent Labs  2013/07/01 0450 06/06/13 0425  WBC 14.6* 12.1*  HGB 9.2* 9.2*  HCT 27.6* 28.5*  PLT 285 281   BMET  Recent Labs  06/05/13 0515 06/06/13 0425  NA 140 139  K 4.1 3.9  CL 101 102  CO2 27 24  GLUCOSE 101* 143*  BUN 37* 38*  CREATININE 3.15* 3.18*  CALCIUM 9.9 9.9   PT/INR No results found for this basename: LABPROT, INR,  in the last 72 hours ABG No results found for this basename: PHART, PCO2, PO2, HCO3,  in the last 72 hours  Studies/Results: Dg Abd 2 Views  07/01/2013   CLINICAL DATA:  Abdominal pain and distension.  EXAM: ABDOMEN - 2 VIEW  COMPARISON:  05/31/2013  FINDINGS: The drain has been removed. No clear evidence for free air. There appears to be a small amount of air in the stomach. There are dilated loops of small bowel throughout the mid abdomen with air-fluid levels. There is ostomy in the right lower abdomen.  IMPRESSION: Small bowel distention with air-fluid levels. Findings are suggestive for postoperative ileus versus obstruction. Small bowel distention has slightly progressed since 05/31/2013.   Electronically Signed   By: Markus Daft M.D.   On: 2013-07-01 12:38     Anti-infectives: Anti-infectives   Start     Dose/Rate Route Frequency Ordered Stop   06/05/13 1400  piperacillin-tazobactam (ZOSYN) IVPB 3.375 g     3.375 g 12.5 mL/hr over 240 Minutes Intravenous 3 times per day 06/05/13 1159     06/01/13 1600  piperacillin-tazobactam (ZOSYN) IVPB 2.25 g  Status:  Discontinued     2.25 g 100 mL/hr over 30 Minutes Intravenous 4 times per day 06/01/13 1024 06/05/13 1159   05/31/13 1300  piperacillin-tazobactam (ZOSYN) IVPB 3.375 g  Status:  Discontinued     3.375 g 12.5 mL/hr over 240 Minutes Intravenous 3 times per day 05/31/13 1212 06/01/13 1025   05/21/13 0200  cefoTEtan (CEFOTAN) 2 g in dextrose 5 % 50 mL IVPB     2 g 100 mL/hr over 30 Minutes Intravenous Every 12 hours 05/20/13 1808 05/21/13 0203   05/20/13 0551  cefoTEtan (CEFOTAN) 2 g in dextrose 5 % 50 mL IVPB     2 g 100 mL/hr over 30 Minutes Intravenous On call to O.R. 05/20/13 0551 05/20/13 1345      Assessment/Plan: s/p Procedure(s): LAPAROSCOPIC LOW ANTERIOR RESECTION WITH SPLENIC FLEXURE MOBILIZATION (N/A) diverting OSTOMY (N/A) Refused NG tube, given ileostomy output.  His ostomy is working but I would prefer waiting until the distention is resolved prior to advancing diet.  If no progress tomorrow, will get non-contrast CT IVF's per nephrology, appreciate assistance Cont TPN  LOS:  17 days    Jason Monjaras C. [phone]

## 2013-06-06 NOTE — Progress Notes (Signed)
   [facility] Progress Note    Subjective: No complaints today. Creat unchanged at 3.18, 2.0 liters in and 2700 mL out yest, net negative 670 ml.    Exam  Blood pressure 112/72, pulse 92, temperature 99.3 F (37.4 C), temperature source Oral, resp. rate 19, height 5\' 7"  (1.702 m), weight 84.188 kg (185 lb 9.6 oz), SpO2 100.00%.  gen: alert, no distress  neck: no jvd, flat neck veins  chest: clear bilat  cor: regular, no rub or M  abd: ostomy RLQ, moderate distension, nontender  ext: no leg or UE edema  neuro: nf, ox3   Assessment/Plan:  1. Acute on CKD: due to volume losses related to high-output ostomy. Creat stalled mid 3's, may still be dry due to volume losses.  Will increase IVF"s > replace losses cc:cc with 1/2, and give additional NS at 75/hr + TNA 2. Stage 3 CKD, baseline creat 1.5-2.0 3. Anemia due to ABL 4. Colon cancer, S/P LAR w diverting ileostomy 05/23/13    Kelly Splinter MD  pager [phone]    cell [phone]  06/06/2013, 5:42 PM   Recent Labs Lab 06/04/13 0450 06/05/13 0515 06/06/13 0425  NA 141 140 139  K 4.0 4.1 3.9  CL 103 101 102  CO2 27 27 24   GLUCOSE 139* 101* 143*  BUN 41* 37* 38*  CREATININE 3.14* 3.15* 3.18*  CALCIUM 9.3 9.9 9.9  PHOS 3.6 4.0 4.4    Recent Labs Lab 06/02/13 0510 06/03/13 0625 06/06/13 0425  AST 23  --  15  ALT 37  --  36  ALKPHOS 176*  --  190*  BILITOT 0.4  --  0.4  PROT 5.9*  --  6.8  ALBUMIN 2.2* 2.3* 2.6*    Recent Labs Lab 06/03/13 0625 06/04/13 0450 06/06/13 0425  WBC 13.6* 14.6* 12.1*  NEUTROABS  --   --  9.8*  HGB 9.4* 9.2* 9.2*  HCT 28.4* 27.6* 28.5*  MCV 87.4 87.6 88.2  PLT 286 285 281   . allopurinol  100 mg Oral Daily  . feeding supplement (ENSURE COMPLETE)  237 mL Oral BID BM  . feeding supplement (ENSURE)  1 Container Oral TID BM  . heparin subcutaneous  5,000 Units Subcutaneous Q8H  . insulin aspart  0-15 Units Subcutaneous Q4H  . metoCLOPramide (REGLAN) injection  5 mg  Intravenous TID AC  . piperacillin-tazobactam (ZOSYN)  IV  3.375 g Intravenous Q8H   . sodium chloride 100 mL/hr at 06/05/13 1410  . Marland KitchenTPN (CLINIMIX-E) Adult 65 mL/hr at 06/05/13 1813   And  . fat emulsion 250 mL (06/05/13 1813)  . Marland KitchenTPN (CLINIMIX-E) Adult 65 mL/hr at 06/06/13 1709   And  . fat emulsion 250 mL (06/06/13 1709)   HYDROmorphone (DILAUDID) injection, lip balm, metoprolol, ondansetron (ZOFRAN) IV, sodium chloride

## 2013-06-06 NOTE — Progress Notes (Signed)
Occupational Therapy Treatment and Discharge Patient Details Name: Jason [name] MRN: [id number] DOB: 11-26-1945 Today's Date: 06/06/2013 Time: [id number] OT Time Calculation (min): 11 min  OT Assessment / Plan / Recommendation  History of present illness Patient is a 67 y/o male admitted for colectomy with diverting colostomy due to rectal cancer.   OT comments  Pt is overall S level with ADL activity due to IV pole  Follow Up Recommendations  No OT follow up                Progress towards OT Goals Progress towards OT goals: Goals met/education completed, patient discharged from [place] Discharge plan remains appropriate    Precautions / Restrictions Precautions Precautions: None       ADL  Grooming: Set up Where Assessed - Grooming: Unsupported standing Upper Body Bathing: Set up Where Assessed - Upper Body Bathing: Supported sitting Lower Body Bathing: Supervision/safety Where Assessed - Lower Body Bathing: Unsupported sit to stand Upper Body Dressing: Set up Where Assessed - Upper Body Dressing: Unsupported sitting Lower Body Dressing: Supervision/safety Where Assessed - Lower Body Dressing: Unsupported sit to stand Toilet Transfer: Supervision/safety Toilet Transfer Method: Sit to stand ADL Comments: Pt overall S with ADL actiivty with A due to IV pole.  OT goals are met at this time. Reccomend pt participate in ADL activity and do as much as he can.  Pt will need A due to IV pole and pt agrees.  Will DC OT aas pt is S level due to IV pole      OT Goals(current goals can now be found in the care plan section) ADL Goals Pt Will Perform Lower Body Bathing: with set-up Pt Will Perform Lower Body Dressing: with set-up Pt Will Transfer to Toilet: with set-up Pt Will Perform Toileting - Clothing Manipulation and hygiene: with set-up Pt Will Perform Tub/Shower Transfer: with set-up  Visit Information  Last OT Received On: 06/06/13 Assistance Needed: +1 History of  Present Illness: Patient is a 67 y/o male admitted for colectomy with diverting colostomy due to rectal cancer.          Cognition  Cognition Arousal/Alertness: Awake/alert Behavior During Therapy: WFL for tasks assessed/performed Overall Cognitive Status: Within Functional Limits for tasks assessed    Mobility  Bed Mobility Bed Mobility: Supine to Sit Supine to Sit: 7: Independent Transfers Sit to Stand: 6: Modified independent (Device/Increase time);From bed Stand to Sit: 6: Modified independent (Device/Increase time);To chair/3-in-1    Exercises  General Exercises - Lower Extremity Ankle Circles/Pumps: AROM;20 reps;Seated;Both Long Arc Quad: AROM;20 reps;Seated;Both Heel Slides: AROM;Supine;Both;15 reps Hip ABduction/ADduction: AROM;20 reps;Both;Standing;Other (comment) (with bil UE support) Straight Leg Raises: AROM;Both;Supine;15 reps Hip Flexion/Marching: AROM;20 reps;Both;Seated Heel Raises: AROM;Both;Standing;Other (comment);20 reps (with bil UE support) Other Exercises Other Exercises: standing ham curls x15 bil LEs with bil UE support   Balance     End of Session OT - End of Session Activity Tolerance: Patient tolerated treatment well Patient left: in chair Nurse Communication: Mobility status  GO     Betsy Pries 06/06/2013, 1:04 PM

## 2013-06-06 NOTE — Progress Notes (Signed)
PARENTERAL NUTRITION CONSULT NOTE - FOLLOW UP  Pharmacy Consult for TNA Indication: Post-op Ileus  Allergies  Allergen Reactions  . Nsaids     Asked by surgeon to add this medication class as intolerance due to patients renal insufficiency.    Patient Measurements: Height: 5\' 7"  (170.2 cm) Weight: 185 lb 9.6 oz (84.188 kg) IBW/kg (Calculated) : 66.1 Adjusted Body Weight: 73kg Usual Weight: 104kg  Vital Signs: Temp: 99.2 F (37.3 C) (12/01 0431) Temp src: Oral (12/01 0431) BP: 127/76 mmHg (12/01 0431) Pulse Rate: 92 (12/01 0431)   Intake/Output from previous day: 11/30 0701 - 12/01 0700 In: 2026.3 [I.V.:994.3; IV Piggyback:150; TPN:882.1] Out: 2700 [id number]; Stool:775]  Labs:  Recent Labs  06/04/13 0450 06/06/13 0425  WBC 14.6* 12.1*  HGB 9.2* 9.2*  HCT 27.6* 28.5*  PLT 285 281    Recent Labs  06/04/13 0450 06/05/13 0515 06/06/13 0425  NA 141 140 139  K 4.0 4.1 3.9  CL 103 101 102  CO2 27 27 24   GLUCOSE 139* 101* 143*  BUN 41* 37* 38*  CREATININE 3.14* 3.15* 3.18*  CALCIUM 9.3 9.9 9.9  MG 1.9 1.8 2.0  PHOS 3.6 4.0 4.4  PROT  --   --  6.8  ALBUMIN  --   --  2.6*  AST  --   --  15  ALT  --   --  36  ALKPHOS  --   --  190*  BILITOT  --   --  0.4  TRIG  --   --  179*   Estimated Creatinine Clearance: 23.7 ml/min (by C-G formula based on Cr of 3.18).    Recent Labs  06/06/13 0018 06/06/13 0424 06/06/13 0755  GLUCAP 149* 147* 142*    Medications:  Scheduled:  . allopurinol  100 mg Oral Daily  . feeding supplement (ENSURE COMPLETE)  237 mL Oral BID BM  . feeding supplement (ENSURE)  1 Container Oral TID BM  . heparin subcutaneous  5,000 Units Subcutaneous Q8H  . insulin aspart  0-15 Units Subcutaneous Q4H  . magic mouthwash  5 mL Oral TID  . metoCLOPramide (REGLAN) injection  5 mg Intravenous TID AC  . piperacillin-tazobactam (ZOSYN)  IV  3.375 g Intravenous Q8H   Infusions:  . sodium chloride 100 mL/hr at 06/05/13 1410  . Marland KitchenTPN  (CLINIMIX-E) Adult 65 mL/hr at 06/05/13 1813   And  . fat emulsion 250 mL (06/05/13 1813)   Insulin Requirements in the past 24 hours:   6 units SSI required - moderate scale q4h  Nutritional Goals:   Per RD recs: 1800- 2000 kcal/day, 100-110 g/day protein, 2.3-2.5 L/day fluid  Clinimix E 5/15 at a goal rate of 100ml/hr + 20% fat emulsion at 37ml/hr to provide: 100g/day protein, 1894 Kcal/day.  Current Nutrition:   Diet: NPO again starting 11/29  Clinimix E5/15 at 65 ml/hr + 20% Lipids at 101ml/hr  IVF: NS at 126ml/hr  Assessment: 52 yoM with rectal cancer s/p lap assisted LAR with coloanal anastomosis and diverting loop ileostomy, now with post-op ileus. TNA started 11/21 since surgery does not anticipate bowel function to return over next couple of days and patient has acute protein calorie malnutrition.   Glucose - CBGs 94-148 (goal <150)   Electrolytes - WNL, monitoring closely with Renal insufficiency   Renal function: CKD stage III, renal following, SCr remains elevated, stable. Protein from TPN may be contributing to increase.    LFTs - WNL.  Alk phos remains elevated.  TGs - 179 (12/1), 165 (11/27), 225 (11/22), 238 (11/24) - ok to continue lipids for now but will monitor closely.  On reduced lipids.   Prealbumin - low, 9.8 (11/20), 11.3 (11/22), 14.2(11/24)  11/30: Monitor fluids and electrolytes closely w/ CKD Stage III. Surgery recommends continue NPO given abdominal distention, wait until distention resolved prior to advancing diet.   TPN Access: Double lumen PICC placed 11/20 TPN day#: 9  Plan: At 1800 today:  Continue Clinimix E 5/15 at 65 ml/hr today   Continue electrolytes in TPN at this time but monitor closely  20% fat emulsion at 8 ml/hr.  Watch trig with mild hypertriglyceridemia  TNA to contain standard multivitamins and trace elements.  Continue IVF per MD  Continue moderate scale SSI q 4h  TNA lab panels on Mondays & Thursdays.  F/u  daily  Ralene Bathe, PharmD 06/06/2013, 11:34 AM  Pager: [phone]

## 2013-06-06 NOTE — Progress Notes (Signed)
Foley Catheter removed. Ordered noted to replace after 8 hours if not voiding. Pt has done great today, ambulated in hallway several times; requiring SBA only. PT has Justice patient.

## 2013-06-06 NOTE — Progress Notes (Signed)
NUTRITION FOLLOW UP  Intervention:   - TPN per pharmacy - Diet advancement per MD - RD to continue to monitor   Nutrition Dx:   Inadequate oral intake related to medical condition as evidenced by NPO status - ongoing    Goal:   Pt to meet >/= 90% of their estimated nutrition needs - not met with TPN r/t concern with mild hypertriglyceridemia. Currently getting around 80% of estimated nutritional needs   Assessment:   67 year old male with rectal cancer s/ neoadjuvant chemoRt with history of HTN, Gout, diverticulosis, chronic renal insufficiency, and anemia. Pt now 7 days s/p lap assisted LAR with coloanal anastomosis and diverting loop ileostomy. Pt with post-op ileus; PICC placed 11/20 and plan to start pt on TPN tonight. Pt has been NPO since 11/14 except for one day of clear liquids on 11/17. Pt being brought to CT scan at time of visit. Per MST report, pt denied recent weight loss and decreased appetite PTA.   11/28 - S/p NGT placed 11/22 with 2L output and total output that day of 5.8L. NGT out 11/25. Nephrology following. TPN cut in half yesterday. Low fiber diet started 11/27. Met with pt who reports eating 100% of meals today including some yogurt for breakfast and some beef and soup for lunch. Requesting a snack which was ordered. Interested in getting Ensure Complete.   11/29 - Had nausea and large amount of emesis. Changed to NPO.   11/30 - Pt c/o abdominal fullness. No reports of nausea. Pt refused NGT.   12/1 - Surgeon prefers to wait until pt's abdominal distention resolved before advancing diet. Plan is to continue TPN.   TPN: Clinimix E 5/15 @ 65 ml/hr and lipids @ 8 ml/hr. Provides 1492 kcal, and 78 grams protein per day. Meets 83% minimum estimated energy needs and 78% minimum estimated protein needs.  - Triglycerides elevated and trending up slightly  - BUN/Cr elevated with low GFR - Alk phos elevated but trending up slightly - CBGs < 150mg /dL today - PALB  WNL  CBG (last 3)   Recent Labs  06/06/13 0424 06/06/13 0755 06/06/13 1222  GLUCAP 147* 142* 126*     Height: Ht Readings from Last 1 Encounters:  05/20/13 5\' 7"  (1.702 m)    Weight Status:   Wt Readings from Last 1 Encounters:  06/06/13 185 lb 9.6 oz (84.188 kg)  Admit wt:        199 lb (90.26 kg) Net I/Os: -7.7L  Re-estimated needs:  Kcal: 1800- 2000  Protein: 100-110 grams  Fluid: 2.3-2.5 L/day   Skin: Rectal and abdominal incision, facial edema  Diet Order: NPO   Intake/Output Summary (Last 24 hours) at 06/06/13 1456 Last data filed at 06/06/13 1411  Gross per 24 hour  Intake 2026.34 ml  Output   3850 ml  Net -1823.66 ml    Last BM: 11/28 from ileostomy   Labs:   Recent Labs Lab 06/04/13 0450 06/05/13 0515 06/06/13 0425  NA 141 140 139  K 4.0 4.1 3.9  CL 103 101 102  CO2 27 27 24   BUN 41* 37* 38*  CREATININE 3.14* 3.15* 3.18*  CALCIUM 9.3 9.9 9.9  MG 1.9 1.8 2.0  PHOS 3.6 4.0 4.4  GLUCOSE 139* 101* 143*    CBG (last 3)   Recent Labs  06/06/13 0424 06/06/13 0755 06/06/13 1222  GLUCAP 147* 142* 126*    Scheduled Meds: . allopurinol  100 mg Oral Daily  . feeding supplement (  ENSURE COMPLETE)  237 mL Oral BID BM  . feeding supplement (ENSURE)  1 Container Oral TID BM  . heparin subcutaneous  5,000 Units Subcutaneous Q8H  . insulin aspart  0-15 Units Subcutaneous Q4H  . magic mouthwash  5 mL Oral TID  . metoCLOPramide (REGLAN) injection  5 mg Intravenous TID AC  . piperacillin-tazobactam (ZOSYN)  IV  3.375 g Intravenous Q8H    Continuous Infusions: . sodium chloride 100 mL/hr at 06/05/13 1410  . Marland KitchenTPN (CLINIMIX-E) Adult 65 mL/hr at 06/05/13 1813   And  . fat emulsion 250 mL (06/05/13 1813)  . Marland KitchenTPN (CLINIMIX-E) Adult     And  . fat emulsion      Mikey College MS, RD, LDN [phone] Pager [phone] After Hours Pager

## 2013-06-06 NOTE — Progress Notes (Signed)
Physical Therapy Treatment and Discharge Patient Details Name: Jason [name] MRN: [id number] DOB: 1945-10-27 Today's Date: 06/06/2013 Time: [id number] PT Time Calculation (min): 24 min  PT Assessment / Plan / Recommendation  History of Present Illness Patient is a 67 y/o male admitted for colectomy with diverting colostomy due to rectal cancer.   PT Comments   Pt ambulating modified independent without assistive device today and performed LE exercises.  Pt has met goals and education complete. Pt agreeable to d/c from acute PT at this time.  Pt had no further questions/concerns.   Follow Up Recommendations  Home health PT     Does the patient have the potential to tolerate intense rehabilitation     Barriers to Discharge        Equipment Recommendations  None recommended by PT    Recommendations for Other Services    Frequency     Progress towards PT Goals Progress towards PT goals: Goals met/education completed, patient discharged from PT  Plan Other (comment) (goals met, d/c from PT)    Precautions / Restrictions Precautions Precautions: None Restrictions Weight Bearing Restrictions: No   Pertinent Vitals/Pain n/a    Mobility  Bed Mobility Bed Mobility: Supine to Sit Supine to Sit: 7: Independent Transfers Transfers: Sit to Stand;Stand to Sit Sit to Stand: 6: Modified independent (Device/Increase time);From bed Stand to Sit: 6: Modified independent (Device/Increase time);To chair/3-in-1 Ambulation/Gait Ambulation/Gait Assistance: 6: Modified independent (Device/Increase time) Ambulation Distance (Feet): 400 Feet Assistive device: None Ambulation/Gait Assistance Details: steady gait, did well without assistive device today Gait Pattern: Within Functional Limits    Exercises General Exercises - Lower Extremity Ankle Circles/Pumps: AROM;20 reps;Seated;Both Long Arc Quad: AROM;20 reps;Seated;Both Heel Slides: AROM;Supine;Both;15 reps Hip ABduction/ADduction: AROM;20  reps;Both;Standing;Other (comment) (with bil UE support) Straight Leg Raises: AROM;Both;Supine;15 reps Hip Flexion/Marching: AROM;20 reps;Both;Seated Heel Raises: AROM;Both;Standing;Other (comment);20 reps (with bil UE support) Other Exercises Other Exercises: standing ham curls x15 bil LEs with bil UE support   PT Diagnosis:    PT Problem List:   PT Treatment Interventions:     PT Goals (current goals can now be found in the care plan section)    Visit Information  Last PT Received On: 06/06/13 Assistance Needed: +1 History of Present Illness: Patient is a 67 y/o male admitted for colectomy with diverting colostomy due to rectal cancer.    Subjective Data      Cognition  Cognition Arousal/Alertness: Awake/alert Behavior During Therapy: WFL for tasks assessed/performed Overall Cognitive Status: Within Functional Limits for tasks assessed    Balance     End of Session PT - End of Session Activity Tolerance: Patient tolerated treatment well Patient left: in chair;with call bell/phone within reach Nurse Communication: Mobility status   GP     Adir Schicker,KATHrine E 06/06/2013, 11:54 AM Carmelia Bake, PT, DPT 06/06/2013 Pager: [phone]

## 2013-06-06 NOTE — Clinical Documentation Improvement (Signed)
THIS DOCUMENT IS NOT A PERMANENT PART OF THE MEDICAL RECORD  Please update your documentation with the medical record to reflect your response to this query. If you need help knowing how to do this please call [phone].  06/06/13   Dear Dr. Marcello Moores, [facility],  In a better effort to capture your patient's severity of illness, reflect appropriate length of stay and utilization of resources, a review of the patient medical record has revealed the following indicators.    Based on your clinical judgment, please clarify and document in a progress note and/or discharge summary the clinical condition associated with the following supporting information:  In responding to this query please exercise your independent judgment.  The fact that a query is asked, does not imply that any particular answer is desired or expected.   "Acute protein calorie malnutrition" is documented per pn 05/26/13.   Nutrition Note on 05/27/13 states pt with Obesity/BMI 31.16 kg/(m^2).   Clarification Needed due to above conflicting documentation Please clarify documentation of "Acute Protein Calorie Malnutrition," may qualify for one of the diagnosis listed below or other diagnosis and document in pn or d/c summary    Possible Clinical Conditions?   Severe Malnutrition   Protein Calorie Malnutrition   Severe Protein Calorie Malnutrition   Other Condition  Cannot clinically determine     Supporting Information:  Risk Factors:    You may use possible, probable, or suspect with inpatient documentation. possible, probable, suspected diagnoses MUST be documented at the time of discharge  Reviewed: additional documentation in the medical record  Thank Thayer [place]  Clinical Documentation Specialist: Tees Toh

## 2013-06-07 ENCOUNTER — Inpatient Hospital Stay (HOSPITAL_COMMUNITY): Payer: Medicare Other

## 2013-06-07 LAB — GLUCOSE, CAPILLARY
Glucose-Capillary: 100 mg/dL — ABNORMAL HIGH (ref 70–99)
Glucose-Capillary: 108 mg/dL — ABNORMAL HIGH (ref 70–99)
Glucose-Capillary: 143 mg/dL — ABNORMAL HIGH (ref 70–99)

## 2013-06-07 LAB — RENAL FUNCTION PANEL
Albumin: 2.5 g/dL — ABNORMAL LOW (ref 3.5–5.2)
CO2: 21 mEq/L (ref 19–32)
Chloride: 101 mEq/L (ref 96–112)
Creatinine, Ser: 2.72 mg/dL — ABNORMAL HIGH (ref 0.50–1.35)
GFR calc Af Amer: 26 mL/min — ABNORMAL LOW (ref >90)
GFR calc non Af Amer: 23 mL/min — ABNORMAL LOW (ref >90)
Glucose, Bld: 119 mg/dL — ABNORMAL HIGH (ref 70–99)
Potassium: 3.6 mEq/L (ref 3.5–5.1)

## 2013-06-07 MED ORDER — IOHEXOL 300 MG/ML  SOLN: 25.0000 mL | Freq: Once | INTRAMUSCULAR | Status: AC | PRN

## 2013-06-07 MED ORDER — IOHEXOL 300 MG/ML  SOLN
25.0000 mL | Freq: Once | INTRAMUSCULAR | Status: AC | PRN
  Administered 2013-06-07: 11:00:00 25 mL via ORAL

## 2013-06-07 MED ORDER — TRACE MINERALS CR-CU-F-FE-I-MN-MO-SE-ZN IV SOLN
INTRAVENOUS | Status: AC
  Administered 2013-06-07: 18:00:00 via INTRAVENOUS
  Filled 2013-06-07: qty 2000

## 2013-06-07 MED ORDER — SODIUM CHLORIDE 0.45 % IV SOLN
INTRAVENOUS | Status: DC
  Administered 2013-06-07: 90 mL via INTRAVENOUS
  Administered 2013-06-07: 18:00:00 63.5 mL via INTRAVENOUS
  Administered 2013-06-07: 173.75 mL/h via INTRAVENOUS
  Administered 2013-06-08 (×2): via INTRAVENOUS
  Administered 2013-06-08: 02:00:00 132 mL via INTRAVENOUS
  Administered 2013-06-08 – 2013-06-09 (×4): via INTRAVENOUS
  Administered 2013-06-09: 300 mL/h via INTRAVENOUS
  Administered 2013-06-10 – 2013-06-11 (×9): via INTRAVENOUS

## 2013-06-07 MED ORDER — FAT EMULSION 20 % IV EMUL
250.0000 mL | INTRAVENOUS | Status: AC
  Administered 2013-06-07: 18:00:00 250 mL via INTRAVENOUS
  Filled 2013-06-07: qty 250

## 2013-06-07 NOTE — Consult Note (Signed)
WOC ostomy follow up Stoma type/location: RLQ ileostomy Stomal assessment/size: 1 and 1/8 inches round with rod still in place.  Will be happy to remove with next pouch change if Dr. Marcello Moores agrees. Peristomal assessment: intact, clear Treatment options for stomal/peristomal skin: none indicated, although I am using a skin barrier ring due to the liquid nature of the effluent Output thin green stool.  Nursing staff are monitoring hourly to reduce the likelihood of pouching pulling away from the body due to over-filling Ostomy pouching: 2pc., 2 and 1/4 inch pouching system with skin barrier ring.  Supplies in room. Education provided: Patient understands to call RN when pouch is 1/3 to 1/2 full of effluent.   [place] nursing team will follow, and will remain available to this patient, the nursing and surgical team.  Please re-consult if needed in-between visits. Thanks, Maudie Flakes, MSN, RN, [place], [place], [place] [phone])

## 2013-06-07 NOTE — Progress Notes (Signed)
PARENTERAL NUTRITION CONSULT NOTE - FOLLOW UP  Pharmacy Consult for TNA Indication: Post-op Ileus  Allergies  Allergen Reactions  . Nsaids     Asked by surgeon to add this medication class as intolerance due to patients renal insufficiency.    Patient Measurements: Height: 5\' 7"  (170.2 cm) Weight: 186 lb 1.6 oz (84.414 kg) IBW/kg (Calculated) : 66.1 Adjusted Body Weight: 73kg Usual Weight: 104kg  Vital Signs: Temp: 98.2 F (36.8 C) (12/02 0422) Temp src: Oral (12/02 0422) BP: 131/51 mmHg (12/02 0422) Pulse Rate: 87 (12/02 0422)   Intake/Output from previous day: 12/01 0701 - 12/02 0700 In: 4332.7 [I.V.:3551.3; IV Piggyback:150; TPN:631.5] Out: [id number] [Urine:600; Stool:3470]  Labs:  Recent Labs  06/06/13 0425  WBC 12.1*  HGB 9.2*  HCT 28.5*  PLT 281    Recent Labs  06/05/13 0515 06/06/13 0425  NA 140 139  K 4.1 3.9  CL 101 102  CO2 27 24  GLUCOSE 101* 143*  BUN 37* 38*  CREATININE 3.15* 3.18*  CALCIUM 9.9 9.9  MG 1.8 2.0  PHOS 4.0 4.4  PROT  --  6.8  ALBUMIN  --  2.6*  AST  --  15  ALT  --  36  ALKPHOS  --  190*  BILITOT  --  0.4  PREALBUMIN  --  18.1  TRIG  --  179*   Estimated Creatinine Clearance: 23.7 ml/min (by C-G formula based on Cr of 3.18).    Recent Labs  06/06/13 2018 06/06/13 2345 06/07/13 0413  GLUCAP 138* 115* 143*    Medications:  Scheduled:  . sodium chloride   Intravenous Q4H  . allopurinol  100 mg Oral Daily  . feeding supplement (ENSURE COMPLETE)  237 mL Oral BID BM  . feeding supplement (ENSURE)  1 Container Oral TID BM  . heparin subcutaneous  5,000 Units Subcutaneous Q8H  . insulin aspart  0-15 Units Subcutaneous Q4H  . metoCLOPramide (REGLAN) injection  5 mg Intravenous TID AC  . piperacillin-tazobactam (ZOSYN)  IV  3.375 g Intravenous Q8H   Infusions:  . sodium chloride 75 mL/hr at 06/06/13 2251  . Marland KitchenTPN (CLINIMIX-E) Adult 65 mL/hr at 06/06/13 1709   And  . fat emulsion 250 mL (06/06/13 1709)   Insulin  Requirements in the past 24 hours:   4 units SSI required - moderate scale q4h  Nutritional Goals:   Per RD recs: 1800- 2000 kcal/day, 100-110 g/day protein, 2.3-2.5 L/day fluid  Clinimix E 5/15 at a goal rate of 13ml/hr + 20% fat emulsion at 69ml/hr to provide: 100g/day protein, 1894 Kcal/day.  Current Nutrition:   Diet: NPO again starting 11/29  Clinimix E5/15 at 65 ml/hr + 20% Lipids at 61ml/hr  IVF: NS at 75 ml/hr and Q 4 hours, RN totaling all urinary and GI losses and replacing with 1/2 NS  Assessment: 29 yoM with rectal cancer s/p lap assisted LAR with coloanal anastomosis and diverting loop ileostomy, now with post-op ileus. TNA started 11/21 since surgery does not anticipate bowel function to return over next couple of days and patient has acute protein calorie malnutrition.   Glucose - CBGs at goal <150   Electrolytes - WNL, monitoring closely with Renal insufficiency   Renal function: CKD stage III, renal following, SCr remains elevated, stable.   LFTs - WNL.  Alk phos remains elevated.    TGs - 179 (12/1), 165 (11/27), 225 (11/22), 238 (11/24) - ok to continue lipids for now but will monitor closely.  On reduced  lipids.   Prealbumin - low, 9.8 (11/20), 11.3 (11/22), 14.2(11/24)  12/2 Monitor fluids and electrolytes closely w/ CKD Stage III. Surgery recommends continue NPO given abdominal distention, wait until distention resolved prior to advancing diet.   TPN Access: Double lumen PICC placed 11/20 TPN day#: 10  Plan: At 1800 today:  Continue Clinimix E 5/15 at 65 ml/hr today   Continue electrolytes in TPN at this time but monitor closely  20% fat emulsion at 8 ml/hr.  Watch trig with mild hypertriglyceridemia  TNA to contain standard multivitamins and trace elements.  Continue IVF per MD  Continue moderate scale SSI q 4h  TNA lab panels on Mondays & Thursdays.  F/u daily  Ralene Bathe, PharmD, BCPS 06/07/2013, 7:53 AM  Pager: [phone]

## 2013-06-07 NOTE — Progress Notes (Addendum)
18 Days Post-Op  Subjective: Feels fine. Denies pain. Denies nausea.  Foley removed yesterday.  Having a large amt of ileostomy output.  Getting fluid replacements via IV  Objective: Vital signs in last 24 hours: Temp:  [98.2 F (36.8 C)-99.3 F (37.4 C)] 98.2 F (36.8 C) (12/02 0422) Pulse Rate:  [87-111] 87 (12/02 0422) Resp:  [14-20] 14 (12/02 0422) BP: (112-131)/(51-85) 131/51 mmHg (12/02 0422) SpO2:  [99 %-100 %] 99 % (12/02 0422) Weight:  [186 lb 1.6 oz (84.414 kg)] 186 lb 1.6 oz (84.414 kg) (12/02 [id number]) Last BM Date: 06/07/13  Intake/Output from previous day: Jun 13, 2023 0701 - 12/02 0700 In: 4332.7 [I.V.:3551.3; IV Piggyback:150; TPN:631.5] Out: [id number] [Urine:600; Stool:3470] Intake/Output this shift: Total I/O In: -  Out: 375 [Urine:125; Stool:250]  General appearance: alert, cooperative and no distress Resp: nonlabored GI: soft, NT, moderate distention and tympany, no peritoneal signs.  ostomy with thin green output.  Lab Results:   Recent Labs  June 12, 2013 0425  WBC 12.1*  HGB 9.2*  HCT 28.5*  PLT 281   BMET  Recent Labs  June 12, 2013 0425 06/07/13 0918  NA 139 133*  K 3.9 3.6  CL 102 101  CO2 24 21  GLUCOSE 143* 119*  BUN 38* 35*  CREATININE 3.18* 2.72*  CALCIUM 9.9 9.6   PT/INR No results found for this basename: LABPROT, INR,  in the last 72 hours ABG No results found for this basename: PHART, PCO2, PO2, HCO3,  in the last 72 hours  Studies/Results: Dg Abd 2 Views  06/12/2013   CLINICAL DATA:  Abdominal distension, no pain  EXAM: ABDOMEN - 2 VIEW  COMPARISON:  06/04/2013.  FINDINGS: There is persistent small bowel dilatation with multiple small bowel air-fluid levels. There is a relative paucity of colonic air. There is no pneumatosis, pneumoperitoneum or portal venous gas. There are no pathologic calcifications. The osseous structures are unremarkable.  IMPRESSION: Persistent small bowel dilatation with multiple small bowel air-fluid levels. The  appearance is concerning for small bowel obstruction versus postoperative ileus.   Electronically Signed   By: Kathreen Devoid   On: 12-Jun-2013 08:22   Dg Abd Acute W/chest  06/07/2013   CLINICAL DATA:  Abdominal pain, ileus  EXAM: ACUTE ABDOMEN SERIES (ABDOMEN 2 VIEW & CHEST 1 VIEW)  COMPARISON:  June 12, 2013  FINDINGS: Right arm PICC line tip projects over SVC.  Normal heart size, mediastinal contours, and pulmonary vascularity.  Mild elevation of right diaphragm.  Lungs clear.  No pleural effusion or pneumothorax.  Air-filled loops of mildly distended small bowel are seen in the mid abdomen bilaterally.  No definite bowel wall thickening or free intraperitoneal air.  Paucity of colonic gas.  Bones demineralized.  No ureter calcification.  IMPRESSION: Persistent dilatation of small bowel loops highly suspicious for small bowel obstruction.  No definite evidence of perforation/free intraperitoneal air.   Electronically Signed   By: Lavonia Dana M.D.   On: 06/07/2013 09:48    Anti-infectives: Anti-infectives   Start     Dose/Rate Route Frequency Ordered Stop   06/05/13 1400  piperacillin-tazobactam (ZOSYN) IVPB 3.375 g     3.375 g 12.5 mL/hr over 240 Minutes Intravenous 3 times per day 06/05/13 1159     06/01/13 1600  piperacillin-tazobactam (ZOSYN) IVPB 2.25 g  Status:  Discontinued     2.25 g 100 mL/hr over 30 Minutes Intravenous 4 times per day 06/01/13 1024 06/05/13 1159   05/31/13 1300  piperacillin-tazobactam (ZOSYN) IVPB 3.375 g  Status:  Discontinued  3.375 g 12.5 mL/hr over 240 Minutes Intravenous 3 times per day 05/31/13 1212 06/01/13 1025   05/21/13 0200  cefoTEtan (CEFOTAN) 2 g in dextrose 5 % 50 mL IVPB     2 g 100 mL/hr over 30 Minutes Intravenous Every 12 hours 05/20/13 1808 05/21/13 0203   05/20/13 0551  cefoTEtan (CEFOTAN) 2 g in dextrose 5 % 50 mL IVPB     2 g 100 mL/hr over 30 Minutes Intravenous On call to O.R. 05/20/13 0551 05/20/13 1345      Assessment/Plan: s/p  Procedure(s): LAPAROSCOPIC LOW ANTERIOR RESECTION WITH SPLENIC FLEXURE MOBILIZATION (N/A) diverting OSTOMY (N/A) Refused NG tube, given ileostomy output. AXR shows persistent dilated loops of small bowel.  I will get a get non-contrast CT to evaluate.   IVF's per nephrology, appreciate assistance.  Cr decreased today Foley out, will monitor uop and straight cath as needed Cont TPN due to inability to take PO, protein calorie malnutrition   LOS: 18 days    Merridith Dershem C. [id number]

## 2013-06-07 NOTE — Progress Notes (Signed)
  [facility] Progress Note    Subjective: creat down 2.7, I/O + 300 cc yesterday, no complaints   Exam  Blood pressure 131/51, pulse 87, temperature 98.2 F (36.8 C), temperature source Oral, resp. rate 14, height 5\' 7"  (1.702 m), weight 84.414 kg (186 lb 1.6 oz), SpO2 99.00%.  gen: alert, no distress  neck: no jvd, flat neck veins  chest: clear bilat  cor: regular, no rub or M  abd: ostomy RLQ, moderate distension, nontender  ext: no leg or UE edema  neuro: nf, ox3   Assessment/Plan:  1. Acute on CKD: due to volume losses related to high-output ostomy, still dry. Cont replacing cc:cc all GI/urinary losses + additional NS and TNA. Creat improving again 2. Stage 3 CKD, baseline creat 1.5-2.0 3. Anemia due to ABL 4. Colon cancer, S/P LAR w diverting ileostomy 05/23/13    Kelly Splinter MD  pager [phone]    cell [phone]  06/07/2013, 1:31 PM   Recent Labs Lab 06/05/13 0515 06/06/13 0425 06/07/13 0918  NA 140 139 133*  K 4.1 3.9 3.6  CL 101 102 101  CO2 27 24 21   GLUCOSE 101* 143* 119*  BUN 37* 38* 35*  CREATININE 3.15* 3.18* 2.72*  CALCIUM 9.9 9.9 9.6  PHOS 4.0 4.4 3.8    Recent Labs Lab 06/02/13 0510 06/03/13 0625 06/06/13 0425 06/07/13 0918  AST 23  --  15  --   ALT 37  --  36  --   ALKPHOS 176*  --  190*  --   BILITOT 0.4  --  0.4  --   PROT 5.9*  --  6.8  --   ALBUMIN 2.2* 2.3* 2.6* 2.5*    Recent Labs Lab 06/03/13 0625 06/04/13 0450 06/06/13 0425  WBC 13.6* 14.6* 12.1*  NEUTROABS  --   --  9.8*  HGB 9.4* 9.2* 9.2*  HCT 28.4* 27.6* 28.5*  MCV 87.4 87.6 88.2  PLT 286 285 281   . sodium chloride   Intravenous Q4H  . allopurinol  100 mg Oral Daily  . feeding supplement (ENSURE COMPLETE)  237 mL Oral BID BM  . feeding supplement (ENSURE)  1 Container Oral TID BM  . heparin subcutaneous  5,000 Units Subcutaneous Q8H  . insulin aspart  0-15 Units Subcutaneous Q4H  . piperacillin-tazobactam (ZOSYN)  IV  3.375 g Intravenous Q8H    . sodium chloride 75 mL/hr at 06/07/13 1115  . Marland KitchenTPN (CLINIMIX-E) Adult 65 mL/hr at 06/06/13 1709   And  . fat emulsion 250 mL (06/06/13 1709)  . Marland KitchenTPN (CLINIMIX-E) Adult     And  . fat emulsion     HYDROmorphone (DILAUDID) injection, iohexol, lip balm, metoprolol, ondansetron (ZOFRAN) IV, sodium chloride

## 2013-06-08 ENCOUNTER — Telehealth: Payer: Self-pay | Admitting: Oncology

## 2013-06-08 LAB — GLUCOSE, CAPILLARY
Glucose-Capillary: 106 mg/dL — ABNORMAL HIGH (ref 70–99)
Glucose-Capillary: 119 mg/dL — ABNORMAL HIGH (ref 70–99)
Glucose-Capillary: 126 mg/dL — ABNORMAL HIGH (ref 70–99)
Glucose-Capillary: 131 mg/dL — ABNORMAL HIGH (ref 70–99)
Glucose-Capillary: 142 mg/dL — ABNORMAL HIGH (ref 70–99)

## 2013-06-08 LAB — RENAL FUNCTION PANEL
Albumin: 2.5 g/dL — ABNORMAL LOW (ref 3.5–5.2)
Calcium: 9.6 mg/dL (ref 8.4–10.5)
Creatinine, Ser: 2.75 mg/dL — ABNORMAL HIGH (ref 0.50–1.35)
GFR calc Af Amer: 26 mL/min — ABNORMAL LOW (ref >90)
Phosphorus: 4.6 mg/dL (ref 2.3–4.6)
Potassium: 4.2 mEq/L (ref 3.5–5.1)
Sodium: 132 mEq/L — ABNORMAL LOW (ref 135–145)

## 2013-06-08 MED ORDER — TRACE MINERALS CR-CU-F-FE-I-MN-MO-SE-ZN IV SOLN
INTRAVENOUS | Status: AC
  Administered 2013-06-08: 18:00:00 via INTRAVENOUS
  Filled 2013-06-08: qty 2000

## 2013-06-08 MED ORDER — FAT EMULSION 20 % IV EMUL
250.0000 mL | INTRAVENOUS | Status: AC
  Administered 2013-06-08: 250 mL via INTRAVENOUS
  Filled 2013-06-08: qty 250

## 2013-06-08 NOTE — Progress Notes (Signed)
ANTIBIOTIC CONSULT NOTE   Pharmacy Consult for Zosyn Indication: Empiric coverage (possible abdominal or UTI)  Allergies  Allergen Reactions  . Nsaids     Asked by surgeon to add this medication class as intolerance due to patients renal insufficiency.    Patient Measurements: Height: 5\' 7"  (170.2 cm) Weight: 191 lb (86.637 kg) IBW/kg (Calculated) : 66.1  Vital Signs: Temp: 98.4 F (36.9 C) (12/03 0724) Temp src: Oral (12/03 0724) BP: 147/87 mmHg (12/03 0724) Pulse Rate: 91 (12/03 0724) Intake/Output from previous day: 12/02 0701 - 12/03 0700 In: 5251.9 [I.V.:4225.9; IV Piggyback:150; TPN:876] Out: 2335 [Urine:975; Stool:1360]  Labs:  Recent Labs  06/06/13 0425 06/07/13 0918 06/08/13 0925  WBC 12.1*  --   --   HGB 9.2*  --   --   PLT 281  --   --   CREATININE 3.18* 2.72* 2.75*   Estimated Creatinine Clearance: 27.8 ml/min (by C-G formula based on Cr of 2.75).  Microbiology: No results found for this or any previous visit (from the past 720 hour(s)).   Medications:  Anti-infectives   Start     Dose/Rate Route Frequency Ordered Stop   06/05/13 1400  piperacillin-tazobactam (ZOSYN) IVPB 3.375 g     3.375 g 12.5 mL/hr over 240 Minutes Intravenous 3 times per day 06/05/13 1159     06/01/13 1600  piperacillin-tazobactam (ZOSYN) IVPB 2.25 g  Status:  Discontinued     2.25 g 100 mL/hr over 30 Minutes Intravenous 4 times per day 06/01/13 1024 06/05/13 1159   05/31/13 1300  piperacillin-tazobactam (ZOSYN) IVPB 3.375 g  Status:  Discontinued     3.375 g 12.5 mL/hr over 240 Minutes Intravenous 3 times per day 05/31/13 1212 06/01/13 1025   05/21/13 0200  cefoTEtan (CEFOTAN) 2 g in dextrose 5 % 50 mL IVPB     2 g 100 mL/hr over 30 Minutes Intravenous Every 12 hours 05/20/13 1808 05/21/13 0203   05/20/13 0551  cefoTEtan (CEFOTAN) 2 g in dextrose 5 % 50 mL IVPB     2 g 100 mL/hr over 30 Minutes Intravenous On call to O.R. 05/20/13 0551 05/20/13 1345      Assessment: Jason [name] with rectal cancer s/p lap assisted LAR with coloanal anastomosis and diverting loop ileostomy, now with post-op ileus on TNA.  Pharmacy is consulted to dose Zosyn for possible abdominal infection or UTI.  Day # 9 zosyn  Tmax: afebrile  WBCs: Improving, 12.1  Renal: SCr improved to 2.7, CrCl ~ 28 ml/min CG, ~27 ml/min N  No cultures   Goal of Therapy:  Appropriate abx dosing, eradication of infection.   Plan:  Continue Zosyn 3.375g IV Q8H infused over 4hrs.  Follow up renal fxn and culture results as available.  F/u Zosyn duration of therapy   Gretta Arab PharmD, BCPS Pager [phone] 06/08/2013 11:40 AM

## 2013-06-08 NOTE — Progress Notes (Signed)
PARENTERAL NUTRITION CONSULT NOTE - FOLLOW UP  Pharmacy Consult for TNA Indication: Post-op Ileus  Allergies  Allergen Reactions  . Nsaids     Asked by surgeon to add this medication class as intolerance due to patients renal insufficiency.    Patient Measurements: Height: 5\' 7"  (170.2 cm) Weight: 191 lb (86.637 kg) IBW/kg (Calculated) : 66.1 Adjusted Body Weight: 73kg Usual Weight: 104kg  Vital Signs: Temp: 98.4 F (36.9 C) (12/03 0724) Temp src: Oral (12/03 0724) BP: 147/87 mmHg (12/03 0724) Pulse Rate: 91 (12/03 0724)   Intake/Output from previous day: 12/02 0701 - 12/03 0700 In: 5251.9 [I.V.:4225.9; IV Piggyback:150; TPN:876] Out: 2335 [Urine:975; Stool:1360]  Labs:  Recent Labs  06/06/13 0425  WBC 12.1*  HGB 9.2*  HCT 28.5*  PLT 281    Recent Labs  06/06/13 0425 06/07/13 0918 06/08/13 0925  NA 139 133* 132*  K 3.9 3.6 4.2  CL 102 101 100  CO2 24 21 19   GLUCOSE 143* 119* 114*  BUN 38* 35* 36*  CREATININE 3.18* 2.72* 2.75*  CALCIUM 9.9 9.6 9.6  MG 2.0  --   --   PHOS 4.4 3.8 4.6  PROT 6.8  --   --   ALBUMIN 2.6* 2.5* 2.5*  AST 15  --   --   ALT 36  --   --   ALKPHOS 190*  --   --   BILITOT 0.4  --   --   PREALBUMIN 18.1  --   --   TRIG 179*  --   --    Estimated Creatinine Clearance: 27.8 ml/min (by C-G formula based on Cr of 2.75).    Recent Labs  06/07/13 2336 06/08/13 0435 06/08/13 0732  GLUCAP 100* 142* 124*    Medications:  Scheduled:  . sodium chloride   Intravenous Q4H  . allopurinol  100 mg Oral Daily  . heparin subcutaneous  5,000 Units Subcutaneous Q8H  . insulin aspart  0-15 Units Subcutaneous Q4H  . piperacillin-tazobactam (ZOSYN)  IV  3.375 g Intravenous Q8H   Infusions:  . sodium chloride 75 mL/hr at 06/08/13 0130  . Marland KitchenTPN (CLINIMIX-E) Adult 65 mL/hr at 06/07/13 1812   And  . fat emulsion 250 mL (06/07/13 1812)   Insulin Requirements in the past 24 hours:   6 units SSI required - moderate scale  q4h  Nutritional Goals:   Per RD recs 12/1: 1800- 2000 kcal/day, 100-110 g/day protein, 2.3-2.5 L/day fluid  Clinimix E 5/15 at a goal rate of 42ml/hr + 20% fat emulsion at 90ml/hr to provide: 100g/day protein, 1894 Kcal/day.  Current Nutrition:   Diet: NPO again starting 11/29  Clinimix E5/15 at 65 ml/hr + 20% Lipids at 30ml/hr  IVF: NS at 75 ml/hr and 0.45%NaCl Q 4 hours, RN totaling all urinary and GI losses and replacing.  Assessment: 72 yoM with rectal cancer s/p lap assisted LAR with coloanal anastomosis and diverting loop ileostomy, now with post-op ileus. TNA started 11/21 since surgery does not anticipate bowel function to return over next couple of days and patient has acute protein calorie malnutrition.   Glucose - CBGs at goal <150   Electrolytes - Na low (unable to adjust in TNA), other wnl.  Phos 4.6 at ULN.  Monitoring closely with renal insufficiency   Renal function: CKD stage III, renal following, SCr remains elevated, stable at 2.75.  CrCl ~ 28 ml/min  LFTs - WNL.  Alk phos remains elevated.    TGs - 179 (12/1), 165 (  11/27), 238 (11/24), 225 (11/22) - ok to continue reduced rate lipids for now but will monitor closely.  Prealbumin - low, 9.8 (11/20), 11.3 (11/22), 14.2 (11/24)  12/3 Monitor fluids and electrolytes closely w/ CKD Stage III, but remains dry per nephrology.  12/2 I/O net + 2.9L.  Surgery recommends continue NPO given abdominal distention, pt refused NG tube, and wait until distention resolved prior to advancing diet.   TPN Access: Double lumen PICC placed 11/20 TPN day#: 11  Plan: At 1800 today:  Advance Clinimix E 5/15 to goal rate 83 ml/hr today   Continue electrolytes in TPN at this time but monitor closely  TNA to contain standard multivitamins and trace elements.  20% fat emulsion at 8 ml/hr.  Recheck TG on Thursday with mild hypertriglyceridemia  Continue IVF per MD  Continue moderate scale SSI q 4h  TNA lab panels on Mondays  & Thursdays.  F/u daily   Gretta Arab PharmD, BCPS Pager [phone] 06/08/2013 10:15 AM

## 2013-06-08 NOTE — Progress Notes (Signed)
NG tube placed per MD order. Patient did not tolerate procedure well, he had multiple episodes of vomiting while trying to insert the tube. Patient given emotional support throughout procedure. Tube advanced to appropriate position and placement verification confirmed with ascultation. NG tube connected to low intermittent wall suction. All output recored in patients chart and will be replaced per fluid orders. After procedure patient was cleaned up and pain medication was given per request. Patient resting comfortably at this time.

## 2013-06-08 NOTE — Telephone Encounter (Signed)
LVMM on home phone adv of 12/10 appt w GBS per 12/1 POF shh

## 2013-06-08 NOTE — Progress Notes (Signed)
19 Days Post-Op  Subjective: Feels fine. Denies pain. Denies nausea.  Foley out and urinating ok.  Having ileostomy output.  Getting fluid replacements via IV  Objective: Vital signs in last 24 hours: Temp:  [98 F (36.7 C)-98.8 F (37.1 C)] 98.4 F (36.9 C) (12/03 0724) Pulse Rate:  [91-101] 91 (12/03 0724) Resp:  [14-16] 14 (12/03 0724) BP: (129-162)/(68-89) 147/87 mmHg (12/03 0724) SpO2:  [99 %-100 %] 100 % (12/03 0724) Weight:  [191 lb (86.637 kg)] 191 lb (86.637 kg) (12/03 0453) Last BM Date: 06/07/13  Intake/Output from previous day: 12/02 0701 - 12/03 0700 In: 5251.9 [I.V.:4225.9; IV Piggyback:150; TPN:876] Out: 2335 [Urine:975; Stool:1360] Intake/Output this shift: Total I/O In: 0  Out: 420 [Urine:200; Stool:220]  General appearance: alert, cooperative and no distress Resp: nonlabored GI: soft, NT, moderate distention and tympany, no peritoneal signs.  ostomy with thin green output.  Lab Results:   Recent Labs  06/06/13 0425  WBC 12.1*  HGB 9.2*  HCT 28.5*  PLT 281   BMET  Recent Labs  06/07/13 0918 06/08/13 0925  NA 133* 132*  K 3.6 4.2  CL 101 100  CO2 21 19  GLUCOSE 119* 114*  BUN 35* 36*  CREATININE 2.72* 2.75*  CALCIUM 9.6 9.6   PT/INR No results found for this basename: LABPROT, INR,  in the last 72 hours ABG No results found for this basename: PHART, PCO2, PO2, HCO3,  in the last 72 hours  Studies/Results: Ct Abdomen Pelvis Wo Contrast  06/07/2013   CLINICAL DATA:  Abdominal pain and distention.  Rectal carcinoma.  EXAM: CT ABDOMEN AND PELVIS WITHOUT CONTRAST  TECHNIQUE: Multidetector CT imaging of the abdomen and pelvis was performed following the standard protocol without intravenous contrast.  COMPARISON:  05/27/2013  FINDINGS: Moderately dilated small bowel loops showed little change compared to previous study although the stomach is much less dilated. A transition point is seen in the central lower abdominal mesentery which is  suspicious for an adhesion. Distal small bowel loops and colon remain nondilated. Diverticulosis is again noted as well as right lower quadrant ileostomy.  Postsurgical changes again noted in the presacral space which show no significant change. No focal inflammatory process or abscess identified. No definite soft tissue masses or lymphadenopathy identified on this noncontrast study. Noncontrast images of the liver, gallbladder, spleen, pancreas, adrenal glands, and kidneys are unremarkable. No evidence of hydronephrosis.  IMPRESSION: Persistent distal small bowel obstruction, with transition point in the central lower abdominal mesentery suspicious for adhesion.  Diverticulosis. No radiographic evidence of diverticulitis. No definite mass or focal inflammatory process identified.   Electronically Signed   By: Earle Gell M.D.   On: 06/07/2013 16:00   Dg Abd Acute W/chest  06/07/2013   CLINICAL DATA:  Abdominal pain, ileus  EXAM: ACUTE ABDOMEN SERIES (ABDOMEN 2 VIEW & CHEST 1 VIEW)  COMPARISON:  06/06/2013  FINDINGS: Right arm PICC line tip projects over SVC.  Normal heart size, mediastinal contours, and pulmonary vascularity.  Mild elevation of right diaphragm.  Lungs clear.  No pleural effusion or pneumothorax.  Air-filled loops of mildly distended small bowel are seen in the mid abdomen bilaterally.  No definite bowel wall thickening or free intraperitoneal air.  Paucity of colonic gas.  Bones demineralized.  No ureter calcification.  IMPRESSION: Persistent dilatation of small bowel loops highly suspicious for small bowel obstruction.  No definite evidence of perforation/free intraperitoneal air.   Electronically Signed   By: Crist Infante.D.  On: 06/07/2013 09:48    Anti-infectives: Anti-infectives   Start     Dose/Rate Route Frequency Ordered Stop   06/05/13 1400  piperacillin-tazobactam (ZOSYN) IVPB 3.375 g     3.375 g 12.5 mL/hr over 240 Minutes Intravenous 3 times per day 06/05/13 1159      06/01/13 1600  piperacillin-tazobactam (ZOSYN) IVPB 2.25 g  Status:  Discontinued     2.25 g 100 mL/hr over 30 Minutes Intravenous 4 times per day 06/01/13 1024 06/05/13 1159   05/31/13 1300  piperacillin-tazobactam (ZOSYN) IVPB 3.375 g  Status:  Discontinued     3.375 g 12.5 mL/hr over 240 Minutes Intravenous 3 times per day 05/31/13 1212 06/01/13 1025   05/21/13 0200  cefoTEtan (CEFOTAN) 2 g in dextrose 5 % 50 mL IVPB     2 g 100 mL/hr over 30 Minutes Intravenous Every 12 hours 05/20/13 1808 05/21/13 0203   05/20/13 0551  cefoTEtan (CEFOTAN) 2 g in dextrose 5 % 50 mL IVPB     2 g 100 mL/hr over 30 Minutes Intravenous On call to O.R. 05/20/13 0551 05/20/13 1345      Assessment/Plan: s/p Procedure(s): LAPAROSCOPIC LOW ANTERIOR RESECTION WITH SPLENIC FLEXURE MOBILIZATION (N/A) diverting OSTOMY (N/A) CT shows persistent dilated loops of small bowel with transition point.  Decompressed small bowel distally.   Early post op bowel obstruction: will replace NG today.  IVF's per nephrology, appreciate assistance.   Foley out, will monitor uop and straight cath as needed.  Encouraged pt to urinate every 4h while awake to help with bladder emptying Cont TPN due to inability to take PO, protein calorie malnutrition   LOS: 19 days    Camauri Fleece C. [id number]

## 2013-06-08 NOTE — Progress Notes (Signed)
Pt received 1/4th of 1/2NS that was ordered by Nephrology d/t miscommunication/misinterpretation of order. Pt total output from 1600 on 06/07/13 until 0200 on 06/08/13 was 944ml and pt received 268ml of 1/2NS until it was discovered that pt was not receiving complete order of replacement 1/2NS. MD on call notified immediately and orders received to just run 1/2 NS @50  ml/hr until Dr. Jonnie Finner rounded on pt this am. Orders implemented

## 2013-06-08 NOTE — Progress Notes (Signed)
  [facility] Progress Note    Subjective: creat no change at 2.7 today, I/O 5.2L in and 2.3 L out, net + 2.9L.  UOP 975cc / 24hrs. Wt up 2kg, BP's 120-140/70-90.     Exam: bp 173/84, pulse 91, temperature 99.2 F (37.3 C), temperature source Oral, resp. rate 16, height 5\' 7"  (1.702 m), weight 86.637 kg (191 lb), SpO2 100.00%.  gen: alert, no distress  neck: no jvd, flat neck veins  chest: clear bilat  cor: regular, no rub or M  abd: ostomy RLQ, moderate distension, nontender  ext: no leg or UE edema  neuro: nf, ox3    UA > 30 protein, 3-6 wbc/rbc, rare bact  UNa > 58   Ucreat > 108  Renal US > 9/1 / 10.7cm kidneys, no hydro, nl echo  CXR 12/2 > lungs clear  Assessment/Plan:  1. Acute on CKD: suspected due to vol depletion. GI situation still not resolved, NG tube back in, SBO picture on xrays >> will continue replacement IVF"s, stop NS at 75 and continue TNA. This should result in being + 1-2 L/day, he is still 4kg down from admission but BP is good, no hypotension.  Looks close to euvolemic.  2. Stage 3 CKD, baseline creat 1.8-2.1 3. Anemia due to ABL/CKD: Hb 9.2, will check Fe stores 4. Rectal/colon cancer, S/P LAR w diverting ileostomy 05/23/13    Kelly Splinter MD  pager [phone]    cell [phone]  06/08/2013, 8:34 PM   Recent Labs Lab 06/06/13 0425 06/07/13 0918 06/08/13 0925  NA 139 133* 132*  K 3.9 3.6 4.2  CL 102 101 100  CO2 24 21 19   GLUCOSE 143* 119* 114*  BUN 38* 35* 36*  CREATININE 3.18* 2.72* 2.75*  CALCIUM 9.9 9.6 9.6  PHOS 4.4 3.8 4.6    Recent Labs Lab 06/02/13 0510  06/06/13 0425 06/07/13 0918 06/08/13 0925  AST 23  --  15  --   --   ALT 37  --  36  --   --   ALKPHOS 176*  --  190*  --   --   BILITOT 0.4  --  0.4  --   --   PROT 5.9*  --  6.8  --   --   ALBUMIN 2.2*  < > 2.6* 2.5* 2.5*  < > = values in this interval not displayed.  Recent Labs Lab 06/03/13 0625 06/04/13 0450 06/06/13 0425  WBC 13.6* 14.6* 12.1*   NEUTROABS  --   --  9.8*  HGB 9.4* 9.2* 9.2*  HCT 28.4* 27.6* 28.5*  MCV 87.4 87.6 88.2  PLT 286 285 281   . sodium chloride   Intravenous Q4H  . allopurinol  100 mg Oral Daily  . heparin subcutaneous  5,000 Units Subcutaneous Q8H  . insulin aspart  0-15 Units Subcutaneous Q4H  . piperacillin-tazobactam (ZOSYN)  IV  3.375 g Intravenous Q8H   . Marland KitchenTPN (CLINIMIX-E) Adult 83 mL/hr at 06/08/13 1733   And  . fat emulsion 250 mL (06/08/13 1733)   HYDROmorphone (DILAUDID) injection, lip balm, metoprolol, ondansetron (ZOFRAN) IV, sodium chloride

## 2013-06-09 ENCOUNTER — Other Ambulatory Visit: Payer: Self-pay | Admitting: *Deleted

## 2013-06-09 LAB — RENAL FUNCTION PANEL
Albumin: 2.3 g/dL — ABNORMAL LOW (ref 3.5–5.2)
CO2: 22 mEq/L (ref 19–32)
Calcium: 9.5 mg/dL (ref 8.4–10.5)
Chloride: 100 mEq/L (ref 96–112)
GFR calc Af Amer: 27 mL/min — ABNORMAL LOW (ref >90)
GFR calc non Af Amer: 23 mL/min — ABNORMAL LOW (ref >90)
Glucose, Bld: 136 mg/dL — ABNORMAL HIGH (ref 70–99)
Potassium: 3.4 mEq/L — ABNORMAL LOW (ref 3.5–5.1)
Sodium: 134 mEq/L — ABNORMAL LOW (ref 135–145)

## 2013-06-09 LAB — PHOSPHORUS: Phosphorus: 3.8 mg/dL (ref 2.3–4.6)

## 2013-06-09 LAB — GLUCOSE, CAPILLARY
Glucose-Capillary: 121 mg/dL — ABNORMAL HIGH (ref 70–99)
Glucose-Capillary: 116 mg/dL — ABNORMAL HIGH (ref 70–99)
Glucose-Capillary: 118 mg/dL — ABNORMAL HIGH (ref 70–99)

## 2013-06-09 LAB — COMPREHENSIVE METABOLIC PANEL
ALT: 15 U/L (ref 0–53)
AST: 10 U/L (ref 0–37)
Albumin: 2.4 g/dL — ABNORMAL LOW (ref 3.5–5.2)
BUN: 38 mg/dL — ABNORMAL HIGH (ref 6–23)
Calcium: 9.5 mg/dL (ref 8.4–10.5)
Creatinine, Ser: 2.71 mg/dL — ABNORMAL HIGH (ref 0.50–1.35)
GFR calc Af Amer: 27 mL/min — ABNORMAL LOW (ref >90)
GFR calc non Af Amer: 23 mL/min — ABNORMAL LOW (ref >90)
Glucose, Bld: 137 mg/dL — ABNORMAL HIGH (ref 70–99)
Potassium: 3.4 mEq/L — ABNORMAL LOW (ref 3.5–5.1)
Sodium: 135 mEq/L (ref 135–145)
Total Protein: 6.6 g/dL (ref 6.0–8.3)

## 2013-06-09 LAB — IRON AND TIBC
Saturation Ratios: 12 % — ABNORMAL LOW (ref 20–55)
TIBC: 190 ug/dL — ABNORMAL LOW (ref 215–435)

## 2013-06-09 LAB — TRIGLYCERIDES: Triglycerides: 178 mg/dL — ABNORMAL HIGH (ref <150)

## 2013-06-09 MED ORDER — SODIUM CHLORIDE 0.9 % IV SOLN
250.0000 mg | INTRAVENOUS | Status: AC
  Administered 2013-06-09 – 2013-06-11 (×2): 250 mg via INTRAVENOUS
  Filled 2013-06-09 (×2): qty 20

## 2013-06-09 MED ORDER — POTASSIUM CHLORIDE 10 MEQ/100ML IV SOLN
10.0000 meq | INTRAVENOUS | Status: AC
  Administered 2013-06-09 (×4): 10 meq via INTRAVENOUS
  Filled 2013-06-09 (×4): qty 100

## 2013-06-09 MED ORDER — TRACE MINERALS CR-CU-F-FE-I-MN-MO-SE-ZN IV SOLN
INTRAVENOUS | Status: AC
  Administered 2013-06-09: 18:00:00 via INTRAVENOUS
  Filled 2013-06-09: qty 2000

## 2013-06-09 MED ORDER — MAGNESIUM SULFATE IN D5W 10-5 MG/ML-% IV SOLN
1.0000 g | Freq: Once | INTRAVENOUS | Status: AC
  Administered 2013-06-09: 12:00:00 1 g via INTRAVENOUS
  Filled 2013-06-09: qty 100

## 2013-06-09 MED ORDER — FAT EMULSION 20 % IV EMUL
250.0000 mL | INTRAVENOUS | Status: AC
Start: 2013-06-09 — End: 2013-06-10
  Administered 2013-06-09: 18:00:00 250 mL via INTRAVENOUS
  Filled 2013-06-09: qty 250

## 2013-06-09 NOTE — Progress Notes (Signed)
PARENTERAL NUTRITION CONSULT NOTE - FOLLOW UP  Pharmacy Consult for TNA Indication: Post-op Ileus  Allergies  Allergen Reactions  . Nsaids     Asked by surgeon to add this medication class as intolerance due to patients renal insufficiency.    Patient Measurements: Height: 5\' 7"  (170.2 cm) Weight: 189 lb 13.1 oz (86.1 kg) IBW/kg (Calculated) : 66.1 Adjusted Body Weight: 73kg Usual Weight: 104kg  Vital Signs: Temp: 99 F (37.2 C) (12/04 0529) Temp src: Oral (12/04 0529) BP: 154/91 mmHg (12/04 0529) Pulse Rate: 93 (12/04 0529)   Intake/Output from previous day: 12/03 0701 - 12/04 0700 In: 6113.8 [I.V.:4016.7; IV Piggyback:150; TPN:1947.1] Out: [id number] [Urine:1350; Emesis/NG output:3450; [id number]  Labs: No results found for this basename: WBC, HGB, HCT, PLT, APTT, INR,  in the last 72 hours  Recent Labs  06/07/13 0918 06/08/13 0925 06/09/13 0315 06/09/13 0434  NA 133* 132*  --  135  134*  K 3.6 4.2  --  3.4*  3.4*  CL 101 100  --  100  100  CO2 21 19  --  22  22  GLUCOSE 119* 114*  --  137*  136*  BUN 35* 36*  --  38*  39*  CREATININE 2.72* 2.75*  --  2.71*  2.70*  CALCIUM 9.6 9.6  --  9.5  9.5  MG  --   --   --  1.6  PHOS 3.8 4.6  --  3.8  3.7  PROT  --   --   --  6.6  ALBUMIN 2.5* 2.5*  --  2.4*  2.3*  AST  --   --   --  10  ALT  --   --   --  15  ALKPHOS  --   --   --  134*  BILITOT  --   --   --  0.3  TRIG  --   --  178*  --    Estimated Creatinine Clearance: 28.1 ml/min (by C-G formula based on Cr of 2.71).    Recent Labs  06/08/13 2341 06/09/13 0412 06/09/13 0751  GLUCAP 131* 130* 121*    Medications:  Infusions:  . Marland KitchenTPN (CLINIMIX-E) Adult 83 mL/hr at 06/08/13 1733   And  . fat emulsion 250 mL (06/08/13 1733)   Insulin Requirements in the past 24 hours:   6 units SSI required - moderate scale q4h  Nutritional Goals:   Per RD recs 12/1: 1800- 2000 kcal/day, 100-110 g/day protein, 2.3-2.5 L/day fluid  Clinimix E 5/15 at a  goal rate of 56ml/hr + 20% fat emulsion at 69ml/hr to provide: 100g/day protein, 1894 Kcal/day.  Current Nutrition:   Diet: NPO (starting 11/29)  Clinimix E5/15 at 83 ml/hr + 20% Lipids at 48ml/hr  IVF: Per nephrology, RN to total all urinary and GI losses and replace with 0.45% NaCl Q 4 hours.  Assessment: 65 yoM with rectal cancer s/p lap assisted LAR with coloanal anastomosis and diverting loop ileostomy, now with post-op ileus. TNA started 11/21 since surgery does not anticipate bowel function to return over next couple of days and patient has acute protein calorie malnutrition.   Glucose - CBGs at goal <150   Electrolytes - K low at 3.4, Mag at 1.6, Phos 3.8.  Monitoring closely with renal insufficiency.  Renal function: CKD stage III, renal following, SCr remains elevated, slightly improved at 2.71.  CrCl ~ 28 ml/min.  UOP 0.65 ml/kg/hr.  LFTs - WNL.  Alk phos elevated but improving.  TGs - 178 (12/4), 179 (12/1), 165 (11/27), 238 (11/24), 225 (11/22) - ok to continue reduced rate lipids for now but will monitor closely.  Prealbumin - improving.  9.8 (11/20), 11.3 (11/22), 14.2 (11/24), 18.1 (12/1)  12/4  Monitor fluids and electrolytes closely w/ CKD Stage III, but about euvolemic per nephrology and maintenance IVF d/c.  Net I/O yesterday = -245mL.  Surgery recommends continued NPO, NG tube replaced 12/3 for early post-op bowel obstruction.  TG remain slightly elevated, but stable.  TPN Access: Double lumen PICC placed 11/20 TPN day#: 12  Plan: At 1800 today:  Continue Clinimix E 5/15 at goal rate 83 ml/hr   Continue electrolytes in TPN at this time but monitor closely  TNA to contain standard multivitamins and trace elements.  20% fat emulsion at 8 ml/hr.  Recheck TG on Monday with TNA labs  Continue IVF per MD  Continue moderate scale SSI q 4h  Magnesium 1g IV bolus x1  KCl 10 mEq IV bolus x4  Recheck with BMET and Magnesium level tomorrow.  TNA lab panels  on Mondays & Thursdays.  F/u daily   Gretta Arab PharmD, BCPS Pager [phone] 06/09/2013 10:36 AM

## 2013-06-09 NOTE — Progress Notes (Signed)
20 Days Post-Op  Subjective: Feels fine. Denies pain. Denies nausea.  Foley out and urinating ok.  Having ileostomy and NG output.  Getting IV fluid replacements   Objective: Vital signs in last 24 hours: Temp:  [98.6 F (37 C)-99.2 F (37.3 C)] 99 F (37.2 C) (12/04 0529) Pulse Rate:  [91-93] 93 (12/04 0529) Resp:  [16-20] 20 (12/04 0529) BP: (120-173)/(66-91) 154/91 mmHg (12/04 0529) SpO2:  [100 %] 100 % (12/04 0529) Weight:  [189 lb 13.1 oz (86.1 kg)] 189 lb 13.1 oz (86.1 kg) (12/04 0529) Last BM Date: 06/08/13  Intake/Output from previous day: 12/03 0701 - 12/04 0700 In: 6113.8 [I.V.:4016.7; IV Piggyback:150; TPN:1947.1] Out: [id number] [Urine:1350; Emesis/NG output:3450; Stool:1520] Intake/Output this shift: Total I/O In: 30 [NG/GT:30] Out: 1300 [Urine:250; Emesis/NG output:750; Stool:300]  General appearance: alert, cooperative and no distress Resp: nonlabored GI: soft, NT, mild distention, no peritoneal signs.  ostomy with thin green output. NG with brown liquid return  Lab Results:  No results found for this basename: WBC, HGB, HCT, PLT,  in the last 72 hours BMET  Recent Labs  06/08/13 0925 06/09/13 0434  NA 132* 135  134*  K 4.2 3.4*  3.4*  CL 100 100  100  CO2 19 22  22   GLUCOSE 114* 137*  136*  BUN 36* 38*  39*  CREATININE 2.75* 2.71*  2.70*  CALCIUM 9.6 9.5  9.5   PT/INR No results found for this basename: LABPROT, INR,  in the last 72 hours ABG No results found for this basename: PHART, PCO2, PO2, HCO3,  in the last 72 hours  Studies/Results: Ct Abdomen Pelvis Wo Contrast  06/07/2013   CLINICAL DATA:  Abdominal pain and distention.  Rectal carcinoma.  EXAM: CT ABDOMEN AND PELVIS WITHOUT CONTRAST  TECHNIQUE: Multidetector CT imaging of the abdomen and pelvis was performed following the standard protocol without intravenous contrast.  COMPARISON:  05/27/2013  FINDINGS: Moderately dilated small bowel loops showed little change compared to previous  study although the stomach is much less dilated. A transition point is seen in the central lower abdominal mesentery which is suspicious for an adhesion. Distal small bowel loops and colon remain nondilated. Diverticulosis is again noted as well as right lower quadrant ileostomy.  Postsurgical changes again noted in the presacral space which show no significant change. No focal inflammatory process or abscess identified. No definite soft tissue masses or lymphadenopathy identified on this noncontrast study. Noncontrast images of the liver, gallbladder, spleen, pancreas, adrenal glands, and kidneys are unremarkable. No evidence of hydronephrosis.  IMPRESSION: Persistent distal small bowel obstruction, with transition point in the central lower abdominal mesentery suspicious for adhesion.  Diverticulosis. No radiographic evidence of diverticulitis. No definite mass or focal inflammatory process identified.   Electronically Signed   By: Earle Gell M.D.   On: 06/07/2013 16:00    Anti-infectives: Anti-infectives   Start     Dose/Rate Route Frequency Ordered Stop   06/05/13 1400  piperacillin-tazobactam (ZOSYN) IVPB 3.375 g     3.375 g 12.5 mL/hr over 240 Minutes Intravenous 3 times per day 06/05/13 1159     06/01/13 1600  piperacillin-tazobactam (ZOSYN) IVPB 2.25 g  Status:  Discontinued     2.25 g 100 mL/hr over 30 Minutes Intravenous 4 times per day 06/01/13 1024 06/05/13 1159   05/31/13 1300  piperacillin-tazobactam (ZOSYN) IVPB 3.375 g  Status:  Discontinued     3.375 g 12.5 mL/hr over 240 Minutes Intravenous 3 times per day 05/31/13 1212 06/01/13  1025   05/21/13 0200  cefoTEtan (CEFOTAN) 2 g in dextrose 5 % 50 mL IVPB     2 g 100 mL/hr over 30 Minutes Intravenous Every 12 hours 05/20/13 1808 05/21/13 0203   05/20/13 0551  cefoTEtan (CEFOTAN) 2 g in dextrose 5 % 50 mL IVPB     2 g 100 mL/hr over 30 Minutes Intravenous On call to O.R. 05/20/13 0551 05/20/13 1345      Assessment/Plan: s/p  Procedure(s): LAPAROSCOPIC LOW ANTERIOR RESECTION WITH SPLENIC FLEXURE MOBILIZATION (N/A) diverting OSTOMY (N/A) CT shows persistent dilated loops of small bowel with transition point.  Decompressed small bowel distally.   Early post op bowel obstruction: cont NG  IVF's per nephrology, appreciate assistance.   Foley out, will monitor uop and straight cath as needed.  Encouraged pt to urinate every 4h while awake to help with bladder emptying Cont TPN due to inability to take PO, protein calorie malnutrition   LOS: 20 days    Bernal Luhman C. [id number]

## 2013-06-09 NOTE — Progress Notes (Signed)
  [facility] Progress Note    Subjective: no change in creatinine, I/O's net negative 200cc. Wt down 0.5kg.  BP up a bit   Exam: bp 173/84, pulse 91, temperature 99.2 F (37.3 C), temperature source Oral, resp. rate 16, height 5\' 7"  (1.702 m), weight 86.637 kg (191 lb), SpO2 100.00%.  gen: alert, no distress  neck: no jvd, flat neck veins  chest: clear bilat  cor: regular, no rub or M  abd: ostomy RLQ, moderate distension, nontender  ext: no leg or UE edema  neuro: nf, ox3    UA > 30 protein, 3-6 wbc/rbc, rare bact  UNa > 58   Ucreat > 108  Renal US > 9/1 / 10.7cm kidneys, no hydro, nl echo  CXR 12/2 > lungs clear  Assessment/Plan:  1. Acute on CKD due to marked GI losses: creat levelling off in 2.7 range.  eGFR 25 ml/min.  May be new baseline.  Still with poor bowel fxn, NG in. Cont fluids as they are. Will follow.  2. Stage 3 CKD, baseline creat 1.8-2.1 3. Anemia due to ABL/CKD: Hb 9.2, with low iron stores > IV iron 250 mg x 2 4. Rectal/colon cancer, S/P LAR w diverting ileostomy 05/23/13    Kelly Splinter MD  pager [phone]    cell [phone]  06/09/2013, 1:47 PM   Recent Labs Lab 06/07/13 0918 06/08/13 0925 06/09/13 0434  NA 133* 132* 135  134*  K 3.6 4.2 3.4*  3.4*  CL 101 100 100  100  CO2 21 19 22  22   GLUCOSE 119* 114* 137*  136*  BUN 35* 36* 38*  39*  CREATININE 2.72* 2.75* 2.71*  2.70*  CALCIUM 9.6 9.6 9.5  9.5  PHOS 3.8 4.6 3.8  3.7    Recent Labs Lab 06/06/13 0425 06/07/13 0918 06/08/13 0925 06/09/13 0434  AST 15  --   --  10  ALT 36  --   --  15  ALKPHOS 190*  --   --  134*  BILITOT 0.4  --   --  0.3  PROT 6.8  --   --  6.6  ALBUMIN 2.6* 2.5* 2.5* 2.4*  2.3*    Recent Labs Lab 06/03/13 0625 06/04/13 0450 06/06/13 0425  WBC 13.6* 14.6* 12.1*  NEUTROABS  --   --  9.8*  HGB 9.4* 9.2* 9.2*  HCT 28.4* 27.6* 28.5*  MCV 87.4 87.6 88.2  PLT 286 285 281   . sodium chloride   Intravenous Q4H  . allopurinol  100 mg  Oral Daily  . heparin subcutaneous  5,000 Units Subcutaneous Q8H  . insulin aspart  0-15 Units Subcutaneous Q4H  . piperacillin-tazobactam (ZOSYN)  IV  3.375 g Intravenous Q8H  . potassium chloride  10 mEq Intravenous Q1 Hr x 4   . Marland KitchenTPN (CLINIMIX-E) Adult 83 mL/hr at 06/08/13 1733   And  . fat emulsion 250 mL (06/08/13 1733)  . Marland KitchenTPN (CLINIMIX-E) Adult     And  . fat emulsion     HYDROmorphone (DILAUDID) injection, lip balm, metoprolol, ondansetron (ZOFRAN) IV, sodium chloride

## 2013-06-10 ENCOUNTER — Other Ambulatory Visit: Payer: Medicare Other | Admitting: Lab

## 2013-06-10 ENCOUNTER — Ambulatory Visit: Payer: Medicare Other | Admitting: Oncology

## 2013-06-10 ENCOUNTER — Ambulatory Visit: Payer: Medicare Other

## 2013-06-10 LAB — MAGNESIUM: Magnesium: 2 mg/dL (ref 1.5–2.5)

## 2013-06-10 LAB — RENAL FUNCTION PANEL
Albumin: 2.2 g/dL — ABNORMAL LOW (ref 3.5–5.2)
BUN: 41 mg/dL — ABNORMAL HIGH (ref 6–23)
CO2: 24 mEq/L (ref 19–32)
Chloride: 99 mEq/L (ref 96–112)
GFR calc Af Amer: 25 mL/min — ABNORMAL LOW (ref >90)
GFR calc non Af Amer: 21 mL/min — ABNORMAL LOW (ref >90)
Phosphorus: 4.6 mg/dL (ref 2.3–4.6)
Potassium: 3.3 mEq/L — ABNORMAL LOW (ref 3.5–5.1)

## 2013-06-10 LAB — GLUCOSE, CAPILLARY
Glucose-Capillary: 129 mg/dL — ABNORMAL HIGH (ref 70–99)
Glucose-Capillary: 112 mg/dL — ABNORMAL HIGH (ref 70–99)

## 2013-06-10 LAB — CBC
HCT: 24.9 % — ABNORMAL LOW (ref 39.0–52.0)
Hemoglobin: 8.4 g/dL — ABNORMAL LOW (ref 13.0–17.0)
Platelets: 269 10*3/uL (ref 150–400)
RBC: 2.92 MIL/uL — ABNORMAL LOW (ref 4.22–5.81)
RDW: 14.5 % (ref 11.5–15.5)
WBC: 11.6 10*3/uL — ABNORMAL HIGH (ref 4.0–10.5)

## 2013-06-10 MED ORDER — TRACE MINERALS CR-CU-F-FE-I-MN-MO-SE-ZN IV SOLN
INTRAVENOUS | Status: AC
  Administered 2013-06-10: 17:00:00 via INTRAVENOUS
  Filled 2013-06-10: qty 2000

## 2013-06-10 MED ORDER — POTASSIUM CHLORIDE 10 MEQ/100ML IV SOLN
10.0000 meq | INTRAVENOUS | Status: AC
  Administered 2013-06-10 (×2): 10 meq via INTRAVENOUS
  Filled 2013-06-10 (×2): qty 100

## 2013-06-10 MED ORDER — FAT EMULSION 20 % IV EMUL
250.0000 mL | INTRAVENOUS | Status: AC
  Administered 2013-06-10: 17:00:00 250 mL via INTRAVENOUS
  Filled 2013-06-10: qty 250

## 2013-06-10 MED ORDER — POTASSIUM CHLORIDE 10 MEQ/100ML IV SOLN
10.0000 meq | INTRAVENOUS | Status: DC
  Filled 2013-06-10 (×4): qty 100

## 2013-06-10 MED ORDER — POTASSIUM CHLORIDE 10 MEQ/100ML IV SOLN
10.0000 meq | INTRAVENOUS | Status: AC
  Administered 2013-06-10 (×6): 10 meq via INTRAVENOUS
  Filled 2013-06-10 (×6): qty 100

## 2013-06-10 NOTE — Progress Notes (Addendum)
NUTRITION FOLLOW UP  Intervention:   - TPN per pharmacy - Diet advancement per MD - RD to continue to monitor   Nutrition Dx:   Inadequate oral intake related to medical condition as evidenced by NPO status; ongoing    Goal:   Pt to meet >/= 90% of their estimated nutrition needs; being met  Monitor:  TPN rate, Weight, Labs, Diet advancement   Assessment:   67 year old male with rectal cancer s/ neoadjuvant chemoRt with history of HTN, Gout, diverticulosis, chronic renal insufficiency, and anemia. Pt now 7 days s/p lap assisted LAR with coloanal anastomosis and diverting loop ileostomy. Pt with post-op ileus; PICC placed 11/20 and plan to start pt on TPN tonight. Pt has been NPO since 11/14 except for one day of clear liquids on 11/17. Pt being brought to CT scan at time of visit. Per MST report, pt denied recent weight loss and decreased appetite PTA.   11/28 - S/p NGT placed 11/22 with 2L output and total output that day of 5.8L. NGT out 11/25. Nephrology following. TPN cut in half yesterday. Low fiber diet started 11/27. Met with pt who reports eating 100% of meals today including some yogurt for breakfast and some beef and soup for lunch. Requesting a snack which was ordered. Interested in getting Ensure Complete.   11/29 - Had nausea and large amount of emesis. Changed to NPO.   11/30 - Pt c/o abdominal fullness. No reports of nausea. Pt refused NGT.   12/1 - Surgeon prefers to wait until pt's abdominal distention resolved before advancing diet. Plan is to continue TPN.   12/3 - NG tube in place but, unhooked from suction at time of visit. Per MD note pt having multiple liters of ileostomy and NG output.  TPN continues: Clinimix E5/15 at 83 ml/hr + 20% Lipids at 27ml/hr which provides 1798 kcal and 100 grams of protein. This meets 100% of estimated energy needs and 100% of estimated protein needs. Lipids continue at reduced rate due to elevated triglycerides  - Triglycerides  elevated  (179 mg/dL on 12/1 and 178 mg/dL on 12/4).  - Low Potassium - BUN/Cr elevated with low GFR - CBGs < 150mg /dL today - Low albumin  CBG (last 3)   Recent Labs  06/09/13 2351 06/10/13 0354 06/10/13 0733  GLUCAP 133* 143* 129*     Height: Ht Readings from Last 1 Encounters:  05/20/13 5\' 7"  (1.702 m)    Weight Status:   Wt Readings from Last 1 Encounters:  06/10/13 194 lb 11.2 oz (88.315 kg)  Admit wt:        199 lb (90.26 kg) Net I/Os: +789 ml  Re-estimated needs:  Kcal: 1800- 2000  Protein: 100-110 grams  Fluid: 2.3-2.5 L/day   Skin: Rectal and abdominal incision, facial edema  Diet Order: NPO   Intake/Output Summary (Last 24 hours) at 06/10/13 1318 Last data filed at 06/10/13 1238  Gross per 24 hour  Intake 8835.13 ml  Output   5345 ml  Net 3490.13 ml    Last BM: 12/5 from ileostomy   Labs:   Recent Labs Lab 06/06/13 0425  06/08/13 0925 06/09/13 0434 06/10/13 0552  NA 139  < > 132* 135  134* 135  K 3.9  < > 4.2 3.4*  3.4* 3.3*  CL 102  < > 100 100  100 99  CO2 24  < > 19 22  22 24   BUN 38*  < > 36* 38*  39*  41*  CREATININE 3.18*  < > 2.75* 2.71*  2.70* 2.89*  CALCIUM 9.9  < > 9.6 9.5  9.5 9.3  MG 2.0  --   --  1.6 2.0  PHOS 4.4  < > 4.6 3.8  3.7 4.6  GLUCOSE 143*  < > 114* 137*  136* 145*  < > = values in this interval not displayed.  CBG (last 3)   Recent Labs  06/09/13 2351 06/10/13 0354 06/10/13 0733  GLUCAP 133* 143* 129*    Scheduled Meds: . sodium chloride   Intravenous Q4H  . allopurinol  100 mg Oral Daily  . ferric gluconate (FERRLECIT/NULECIT) IV  250 mg Intravenous QODAY  . heparin subcutaneous  5,000 Units Subcutaneous Q8H  . insulin aspart  0-15 Units Subcutaneous Q4H  . potassium chloride  10 mEq Intravenous Q1 Hr x 6    Continuous Infusions: . Marland KitchenTPN (CLINIMIX-E) Adult 83 mL/hr at 06/09/13 1810   And  . fat emulsion 250 mL (06/09/13 1810)  . Marland KitchenTPN (CLINIMIX-E) Adult     And  . fat emulsion       Pryor Ochoa RD, LDN Inpatient Clinical Dietitian Pager: [phone] After Hours Pager: [phone]

## 2013-06-10 NOTE — Consult Note (Signed)
WOC ostomy follow-up Stoma type/location: Ileostomy to RLQ Stomal assessment/size: Stoma red and viable, flush with skin level, 1 1/8 inches.  Rod which was previously intact to stoma has been removed at some point. Peristomal assessment: Intact skin surrounding. Output 50cc dark green liquid stool Ostomy pouching: 2pc.  Education provided: Pt states is very sick and weak and has an NG in place.  He does not feel ready for a teaching session.  Applied 2 piece pouching system with barrier ring to maintain seal.  Pt did not participate, watch the demonstration, or ask questions during pouch change.  He is unable to assist with pouch emptying at this time. Recommend home health assistance if he does not go to a SNF for pouching assistance.  Supplies at bedside for staff use. Jason Girt MSN, RN, [place], [place], [place]

## 2013-06-10 NOTE — Progress Notes (Signed)
PARENTERAL NUTRITION CONSULT NOTE - FOLLOW UP  Pharmacy Consult for TNA Indication: Post-op Ileus  Allergies  Allergen Reactions  . Nsaids     Asked by surgeon to add this medication class as intolerance due to patients renal insufficiency.    Patient Measurements: Height: 5\' 7"  (170.2 cm) Weight: 194 lb 11.2 oz (88.315 kg) IBW/kg (Calculated) : 66.1 Adjusted Body Weight: 73kg Usual Weight: 104kg  Vital Signs: Temp: 98.5 F (36.9 C) (12/05 0510) Temp src: Oral (12/05 0510) BP: 146/79 mmHg (12/05 0510) Pulse Rate: 101 (12/05 0510)   Intake/Output from previous day: 12/04 0701 - 12/05 0700 In: 8765.1 [I.V.:5825.1; NG/GT:30; IV Piggyback:770; TPN:2140] Out: 5475 [Urine:1050; Emesis/NG output:3100; Stool:1325]  Labs:  Recent Labs  06/10/13 0552  WBC 11.6*  HGB 8.4*  HCT 24.9*  PLT 269    Recent Labs  06/08/13 0925 06/09/13 0315 06/09/13 0434 06/10/13 0552  NA 132*  --  135  134* 135  K 4.2  --  3.4*  3.4* 3.3*  CL 100  --  100  100 99  CO2 19  --  22  22 24   GLUCOSE 114*  --  137*  136* 145*  BUN 36*  --  38*  39* 41*  CREATININE 2.75*  --  2.71*  2.70* 2.89*  CALCIUM 9.6  --  9.5  9.5 9.3  MG  --   --  1.6 2.0  PHOS 4.6  --  3.8  3.7 4.6  PROT  --   --  6.6  --   ALBUMIN 2.5*  --  2.4*  2.3* 2.2*  AST  --   --  10  --   ALT  --   --  15  --   ALKPHOS  --   --  134*  --   BILITOT  --   --  0.3  --   TRIG  --  178*  --   --    Estimated Creatinine Clearance: 26.7 ml/min (by C-G formula based on Cr of 2.89).    Recent Labs  06/09/13 2351 06/10/13 0354 06/10/13 0733  GLUCAP 133* 143* 129*    Medications:  Infusions:  . Marland KitchenTPN (CLINIMIX-E) Adult 83 mL/hr at 06/09/13 1810   And  . fat emulsion 250 mL (06/09/13 1810)   Insulin Requirements in the past 24 hours:   6 units SSI required - moderate scale q4h  Nutritional Goals:   Per RD recs 12/1: 1800- 2000 kcal/day, 100-110 g/day protein, 2.3-2.5 L/day fluid  Clinimix E 5/15 at a  goal rate of 55ml/hr + 20% fat emulsion at 52ml/hr to provide: 100g/day protein, 1894 Kcal/day.  Current Nutrition:   Diet: NPO (starting 11/29)  Clinimix E5/15 at 83 ml/hr + 20% Lipids at 65ml/hr  IVF: Per nephrology, RN to total all urinary and GI losses and replace with 0.45% NaCl Q 4 hours.  Assessment: 5 yoM with rectal cancer s/p lap assisted LAR with coloanal anastomosis and diverting loop ileostomy, now with post-op ileus. TNA started 11/21 since surgery does not anticipate bowel function to return over next couple of days and patient has acute protein calorie malnutrition.   Glucose - CBGs at goal <150   Electrolytes - K low at 3.3, despite replacement 12/4, likely d/t GI losses.  Mag and Phos are wnl.  Monitoring closely with renal insufficiency.  Renal function: CKD stage III, renal following, SCr remains elevated, increased to 2.89 with CrCl ~ 27 ml/min.  UOP 0.5 ml/kg/hr.  LFTs -  WNL.  Alk phos elevated but improving.  TGs - 178 (12/4), 179 (12/1), 165 (11/27), 238 (11/24), 225 (11/22) - ok to continue reduced rate lipids for now but will monitor closely.  Prealbumin - improving.  9.8 (11/20), 11.3 (11/22), 14.2 (11/24), 18.1 (12/1)  12/5  Surgery recommends continued NPO, NG tube replaced 12/3 for early post-op bowel obstruction.  Monitor fluids and electrolytes closely w/ CKD Stage III, but maintenance IVF is a replacement for GI and GU output only.  UOP ok, large amount of NG and ileostomy output with net I/O yesterday = +3.3L.  TG slightly elevated but stable; cont lower dose lipids with recheck on Monday.  TPN Access: Double lumen PICC placed 11/20 TPN day#: 15  Plan: At 1800 today:  Continue Clinimix E 5/15 at goal rate 83 ml/hr   Continue electrolytes in TPN at this time but monitor closely  TNA to contain standard multivitamins and trace elements.  20% fat emulsion at 8 ml/hr.  Recheck TG on Monday with TNA labs  Continue IVF per MD  Continue moderate  scale SSI q 4h  KCl 10 mEq IV bolus x6 per MD, f/u renal function panel in AM.  TNA lab panels on Mondays & Thursdays.  F/u daily   Gretta Arab PharmD, BCPS Pager [phone] 06/10/2013 11:46 AM

## 2013-06-10 NOTE — Progress Notes (Addendum)
21 Days Post-Op  Subjective: Feels fine. Denies pain. Denies nausea.  Foley out and urinating ok.  Having multiple liters of ileostomy and NG output.  Getting IV fluid replacements   Objective: Vital signs in last 24 hours: Temp:  [98.4 F (36.9 C)-99.5 F (37.5 C)] 98.5 F (36.9 C) (12/05 0510) Pulse Rate:  [89-106] 101 (12/05 0510) Resp:  [16] 16 (12/05 0510) BP: (146-152)/(79-94) 146/79 mmHg (12/05 0510) SpO2:  [100 %] 100 % (12/05 0510) Weight:  [194 lb 11.2 oz (88.315 kg)] 194 lb 11.2 oz (88.315 kg) (12/05 0550) Last BM Date: 06/09/13  Intake/Output from previous day: 12/04 0701 - 12/05 0700 In: 8765.1 [I.V.:5825.1; NG/GT:30; IV Piggyback:770; TPN:2140] Out: [id number] [Urine:1050; Emesis/NG output:3100; [id number] Intake/Output this shift:    General appearance: alert, cooperative and no distress Resp: nonlabored GI: soft, NT, mild distention, no peritoneal signs.  ostomy with thin green output. NG with brown liquid return  Lab Results:   Recent Labs  06/10/13 0552  WBC 11.6*  HGB 8.4*  HCT 24.9*  PLT 269   BMET  Recent Labs  06/09/13 0434 06/10/13 0552  NA 135  134* 135  K 3.4*  3.4* 3.3*  CL 100  100 99  CO2 22  22 24   GLUCOSE 137*  136* 145*  BUN 38*  39* 41*  CREATININE 2.71*  2.70* 2.89*  CALCIUM 9.5  9.5 9.3   PT/INR No results found for this basename: LABPROT, INR,  in the last 72 hours ABG No results found for this basename: PHART, PCO2, PO2, HCO3,  in the last 72 hours  Studies/Results: No results found.  Anti-infectives: Anti-infectives   Start     Dose/Rate Route Frequency Ordered Stop   06/05/13 1400  piperacillin-tazobactam (ZOSYN) IVPB 3.375 g  Status:  Discontinued     3.375 g 12.5 mL/hr over 240 Minutes Intravenous 3 times per day 06/05/13 1159 06/10/13 0815   06/01/13 1600  piperacillin-tazobactam (ZOSYN) IVPB 2.25 g  Status:  Discontinued     2.25 g 100 mL/hr over 30 Minutes Intravenous 4 times per day 06/01/13 1024  06/05/13 1159   05/31/13 1300  piperacillin-tazobactam (ZOSYN) IVPB 3.375 g  Status:  Discontinued     3.375 g 12.5 mL/hr over 240 Minutes Intravenous 3 times per day 05/31/13 1212 06/01/13 1025   05/21/13 0200  cefoTEtan (CEFOTAN) 2 g in dextrose 5 % 50 mL IVPB     2 g 100 mL/hr over 30 Minutes Intravenous Every 12 hours 05/20/13 1808 05/21/13 0203   05/20/13 0551  cefoTEtan (CEFOTAN) 2 g in dextrose 5 % 50 mL IVPB     2 g 100 mL/hr over 30 Minutes Intravenous On call to O.R. 05/20/13 0551 05/20/13 1345      Assessment/Plan: s/p Procedure(s): LAPAROSCOPIC LOW ANTERIOR RESECTION WITH SPLENIC FLEXURE MOBILIZATION (N/A) diverting OSTOMY (N/A) CT shows persistent dilated loops of small bowel with transition point.  Decompressed small bowel distally.   Early post op bowel obstruction: cont NG  IVF's per nephrology, appreciate assistance.   Hypokalemia- will replace via IV Foley out, will monitor uop and straight cath as needed.  Encourage pt to urinate every 4h while awake to help with bladder emptying Cont TPN due to inability to take PO, protein calorie malnutrition  No signs of ongoing infection, has completed 10 day course of zosyn, will d/c abx and monitor pt closely  LOS: 21 days    Jason [name] C. [phone]

## 2013-06-10 NOTE — Progress Notes (Signed)
Pharmacy Consult Note - Potassium Replacement  Labs: K 3.3  A/P: Received 6 runs of KCL already today per TNA management. Will give an additional 2 runs to potentially get K > 4 as requested.   Adrian Saran, PharmD, BCPS Pager [phone] 06/10/2013 3:57 PM

## 2013-06-10 NOTE — Progress Notes (Signed)
  [facility] Progress Note    Subjective: no change in creatinine, I/O's net negative 200cc. Wt down 0.5kg.  BP up a bit   Exam: bp 173/84, pulse 91, temperature 99.2 F (37.3 C), temperature source Oral, resp. rate 16, height 5\' 7"  (1.702 m), weight 86.637 kg (191 lb), SpO2 100.00%.  gen: alert, no distress  neck: no jvd, jvp 10 cm  chest: clear bilat  cor: regular, no rub or M  abd: ostomy RLQ, moderate distension, nontender  ext: no leg or UE edema  neuro: nf, ox3    UA > 30 protein, 3-6 wbc/rbc, rare bact  UNa > 58   Ucreat > 108  Renal US > 9/1 / 10.7cm kidneys, no hydro, nl echo  CXR 12/2 > lungs clear  Assessment/Plan:  1. Acute on CKD: resolved, GI losses. Creat leveled off at 2.7 range.  eGFR 25 ml/min.  Still with poor bowel fxn, NG in. Cont fluids as they are. Will follow.  2. Stage 3 CKD, baseline creat 1.8-2.1 3. Anemia due to ABL/CKD: Hb 9's, with low iron stores > IV iron 250 mg x 2 4. Rectal/colon cancer, S/P LAR w diverting ileostomy 05/23/13 5. Post-op ileus: need to get K+ up, will ask for pharm assist to get K>4    Kelly Splinter MD  pager [phone]    cell [phone]  06/10/2013, 3:44 PM   Recent Labs Lab 06/08/13 0925 06/09/13 0434 06/10/13 0552  NA 132* 135  134* 135  K 4.2 3.4*  3.4* 3.3*  CL 100 100  100 99  CO2 19 22  22 24   GLUCOSE 114* 137*  136* 145*  BUN 36* 38*  39* 41*  CREATININE 2.75* 2.71*  2.70* 2.89*  CALCIUM 9.6 9.5  9.5 9.3  PHOS 4.6 3.8  3.7 4.6    Recent Labs Lab 06/06/13 0425  06/08/13 0925 06/09/13 0434 06/10/13 0552  AST 15  --   --  10  --   ALT 36  --   --  15  --   ALKPHOS 190*  --   --  134*  --   BILITOT 0.4  --   --  0.3  --   PROT 6.8  --   --  6.6  --   ALBUMIN 2.6*  < > 2.5* 2.4*  2.3* 2.2*  < > = values in this interval not displayed.  Recent Labs Lab 06/04/13 0450 06/06/13 0425 06/10/13 0552  WBC 14.6* 12.1* 11.6*  NEUTROABS  --  9.8*  --   HGB 9.2* 9.2* 8.4*  HCT 27.6*  28.5* 24.9*  MCV 87.6 88.2 85.3  PLT 285 281 269   . sodium chloride   Intravenous Q4H  . allopurinol  100 mg Oral Daily  . ferric gluconate (FERRLECIT/NULECIT) IV  250 mg Intravenous QODAY  . heparin subcutaneous  5,000 Units Subcutaneous Q8H  . insulin aspart  0-15 Units Subcutaneous Q4H  . potassium chloride  10 mEq Intravenous Q1 Hr x 6   . Marland KitchenTPN (CLINIMIX-E) Adult 83 mL/hr at 06/09/13 1810   And  . fat emulsion 250 mL (06/09/13 1810)  . Marland KitchenTPN (CLINIMIX-E) Adult     And  . fat emulsion     HYDROmorphone (DILAUDID) injection, lip balm, metoprolol, ondansetron (ZOFRAN) IV, sodium chloride

## 2013-06-11 ENCOUNTER — Inpatient Hospital Stay (HOSPITAL_COMMUNITY): Payer: Medicare Other

## 2013-06-11 LAB — BASIC METABOLIC PANEL
CO2: 26 mEq/L (ref 19–32)
Calcium: 9.1 mg/dL (ref 8.4–10.5)
Chloride: 96 mEq/L (ref 96–112)
GFR calc non Af Amer: 18 mL/min — ABNORMAL LOW (ref >90)
Glucose, Bld: 87 mg/dL (ref 70–99)
Potassium: 4 mEq/L (ref 3.5–5.1)
Sodium: 131 mEq/L — ABNORMAL LOW (ref 135–145)

## 2013-06-11 LAB — RENAL FUNCTION PANEL
Albumin: 2.2 g/dL — ABNORMAL LOW (ref 3.5–5.2)
GFR calc Af Amer: 22 mL/min — ABNORMAL LOW (ref >90)
Glucose, Bld: 138 mg/dL — ABNORMAL HIGH (ref 70–99)
Phosphorus: 4.3 mg/dL (ref 2.3–4.6)
Potassium: 3.3 mEq/L — ABNORMAL LOW (ref 3.5–5.1)
Sodium: 133 mEq/L — ABNORMAL LOW (ref 135–145)

## 2013-06-11 LAB — GLUCOSE, CAPILLARY
Glucose-Capillary: 104 mg/dL — ABNORMAL HIGH (ref 70–99)
Glucose-Capillary: 108 mg/dL — ABNORMAL HIGH (ref 70–99)
Glucose-Capillary: 110 mg/dL — ABNORMAL HIGH (ref 70–99)
Glucose-Capillary: 111 mg/dL — ABNORMAL HIGH (ref 70–99)
Glucose-Capillary: 116 mg/dL — ABNORMAL HIGH (ref 70–99)
Glucose-Capillary: 128 mg/dL — ABNORMAL HIGH (ref 70–99)

## 2013-06-11 MED ORDER — FAT EMULSION 20 % IV EMUL
250.0000 mL | INTRAVENOUS | Status: AC
  Administered 2013-06-11 – 2013-06-12 (×2): 250 mL via INTRAVENOUS
  Filled 2013-06-11: qty 250

## 2013-06-11 MED ORDER — SODIUM CHLORIDE 0.45 % IV SOLN: INTRAVENOUS | Status: DC

## 2013-06-11 MED ORDER — POTASSIUM CHLORIDE 10 MEQ/100ML IV SOLN
10.0000 meq | INTRAVENOUS | Status: AC
  Administered 2013-06-11 (×6): 10 meq via INTRAVENOUS
  Filled 2013-06-11 (×6): qty 100

## 2013-06-11 MED ORDER — SODIUM CHLORIDE 0.45 % IV SOLN
INTRAVENOUS | Status: DC
  Administered 2013-06-11 – 2013-06-13 (×8): via INTRAVENOUS
  Filled 2013-06-11 (×15): qty 1000

## 2013-06-11 MED ORDER — POTASSIUM CHLORIDE 10 MEQ/100ML IV SOLN: 10.0000 meq | INTRAVENOUS | Status: DC

## 2013-06-11 MED ORDER — TRACE MINERALS CR-CU-F-FE-I-MN-MO-SE-ZN IV SOLN
INTRAVENOUS | Status: AC
  Administered 2013-06-11: 18:00:00 via INTRAVENOUS
  Filled 2013-06-11: qty 2000

## 2013-06-11 NOTE — Progress Notes (Signed)
Patient ID: Jason [name], male   DOB: 08-13-1945, 67 y.o.   MRN: [id number] [facility] Progress Note:   22 Days Post-Op  Subjective: Mental status is clear.  NG in place but not draining well.  Xray shows it to be in distal esophagus.  Will insert further. Objective: Vital signs in last 24 hours: Temp:  [98.3 F (36.8 C)-99.3 F (37.4 C)] 98.5 F (36.9 C) (12/06 0519) Pulse Rate:  [88-102] 88 (12/06 0519) Resp:  [16-18] 18 (12/06 0519) BP: (126-152)/(62-89) 152/89 mmHg (12/06 0519) SpO2:  [99 %-100 %] 100 % (12/06 0519) Weight:  [197 lb 4.8 oz (89.495 kg)] 197 lb 4.8 oz (89.495 kg) (12/06 0500)  Intake/Output from previous day: 12/05 0701 - 12/06 0700 In: 8023.2 [I.V.:4975.5; IV Piggyback:800; TPN:2247.7] Out: [id number] [Urine:2150; Emesis/NG output:3375; Stool:1220] Intake/Output this shift: Total I/O In: 100 [IV Piggyback:100] Out: 1200 [Urine:300; Emesis/NG output:700; Stool:200]  Physical Exam: Work of breathing is normal.    Lab Results:  Results for orders placed during the hospital encounter of 05/20/13 (from the past 48 hour(s))  GLUCOSE, CAPILLARY     Status: Abnormal   Collection Time    06/09/13 12:18 PM      Result Value Range   Glucose-Capillary 116 (*) 70 - 99 mg/dL   Comment 1 Notify RN     Comment 2 Documented in Chart    GLUCOSE, CAPILLARY     Status: Abnormal   Collection Time    06/09/13  4:58 PM      Result Value Range   Glucose-Capillary 118 (*) 70 - 99 mg/dL   Comment 1 Notify RN     Comment 2 Documented in Chart    GLUCOSE, CAPILLARY     Status: Abnormal   Collection Time    06/09/13  8:01 PM      Result Value Range   Glucose-Capillary 133 (*) 70 - 99 mg/dL  GLUCOSE, CAPILLARY     Status: Abnormal   Collection Time    06/09/13 11:51 PM      Result Value Range   Glucose-Capillary 133 (*) 70 - 99 mg/dL  GLUCOSE, CAPILLARY     Status: Abnormal   Collection Time    06/10/13  3:54 AM      Result Value Range   Glucose-Capillary 143 (*)  70 - 99 mg/dL   Comment 1 Notify RN     Comment 2 Documented in Chart    RENAL FUNCTION PANEL     Status: Abnormal   Collection Time    06/10/13  5:52 AM      Result Value Range   Sodium 135  135 - 145 mEq/L   Potassium 3.3 (*) 3.5 - 5.1 mEq/L   Chloride 99  96 - 112 mEq/L   CO2 24  19 - 32 mEq/L   Glucose, Bld 145 (*) 70 - 99 mg/dL   BUN 41 (*) 6 - 23 mg/dL   Creatinine, Ser 2.89 (*) 0.50 - 1.35 mg/dL   Calcium 9.3  8.4 - 10.5 mg/dL   Phosphorus 4.6  2.3 - 4.6 mg/dL   Albumin 2.2 (*) 3.5 - 5.2 g/dL   GFR calc non Af Amer 21 (*) >90 mL/min   GFR calc Af Amer 25 (*) >90 mL/min   Comment: (NOTE)     The eGFR has been calculated using the CKD EPI equation.     This calculation has not been validated in all clinical situations.     eGFR's persistently <  90 mL/min signify possible Chronic Kidney     Disease.  MAGNESIUM     Status: None   Collection Time    06/10/13  5:52 AM      Result Value Range   Magnesium 2.0  1.5 - 2.5 mg/dL  CBC     Status: Abnormal   Collection Time    06/10/13  5:52 AM      Result Value Range   WBC 11.6 (*) 4.0 - 10.5 K/uL   RBC 2.92 (*) 4.22 - 5.81 MIL/uL   Hemoglobin 8.4 (*) 13.0 - 17.0 g/dL   HCT 24.9 (*) 39.0 - 52.0 %   MCV 85.3  78.0 - 100.0 fL   MCH 28.8  26.0 - 34.0 pg   MCHC 33.7  30.0 - 36.0 g/dL   RDW 14.5  11.5 - 15.5 %   Platelets 269  150 - 400 K/uL  GLUCOSE, CAPILLARY     Status: Abnormal   Collection Time    06/10/13  7:33 AM      Result Value Range   Glucose-Capillary 129 (*) 70 - 99 mg/dL  GLUCOSE, CAPILLARY     Status: Abnormal   Collection Time    06/10/13 12:00 PM      Result Value Range   Glucose-Capillary 113 (*) 70 - 99 mg/dL  GLUCOSE, CAPILLARY     Status: Abnormal   Collection Time    06/10/13  5:00 PM      Result Value Range   Glucose-Capillary 112 (*) 70 - 99 mg/dL  GLUCOSE, CAPILLARY     Status: Abnormal   Collection Time    06/10/13  8:03 PM      Result Value Range   Glucose-Capillary 124 (*) 70 - 99 mg/dL   GLUCOSE, CAPILLARY     Status: Abnormal   Collection Time    06/11/13 12:21 AM      Result Value Range   Glucose-Capillary 108 (*) 70 - 99 mg/dL   Comment 1 Notify RN     Comment 2 Documented in Chart    GLUCOSE, CAPILLARY     Status: Abnormal   Collection Time    06/11/13  4:12 AM      Result Value Range   Glucose-Capillary 122 (*) 70 - 99 mg/dL   Comment 1 Notify RN     Comment 2 Documented in Chart    RENAL FUNCTION PANEL     Status: Abnormal   Collection Time    06/11/13  5:50 AM      Result Value Range   Sodium 133 (*) 135 - 145 mEq/L   Potassium 3.3 (*) 3.5 - 5.1 mEq/L   Chloride 97  96 - 112 mEq/L   CO2 26  19 - 32 mEq/L   Glucose, Bld 138 (*) 70 - 99 mg/dL   BUN 43 (*) 6 - 23 mg/dL   Creatinine, Ser 3.11 (*) 0.50 - 1.35 mg/dL   Calcium 9.1  8.4 - 10.5 mg/dL   Phosphorus 4.3  2.3 - 4.6 mg/dL   Albumin 2.2 (*) 3.5 - 5.2 g/dL   GFR calc non Af Amer 19 (*) >90 mL/min   GFR calc Af Amer 22 (*) >90 mL/min   Comment: (NOTE)     The eGFR has been calculated using the CKD EPI equation.     This calculation has not been validated in all clinical situations.     eGFR's persistently <90 mL/min signify possible Chronic Kidney  Disease.  GLUCOSE, CAPILLARY     Status: Abnormal   Collection Time    06/11/13  7:52 AM      Result Value Range   Glucose-Capillary 128 (*) 70 - 99 mg/dL   Comment 1 Notify RN     Comment 2 Documented in Chart      Radiology/Results: Dg Abd Portable 1v  06/11/2013   CLINICAL DATA:  NG tube placement.  Small bowel obstruction.  EXAM: PORTABLE ABDOMEN - 1 VIEW  COMPARISON:  CT scan dated 06/07/2013 and radiographs dated 06/07/2013 and 06/06/2013  FINDINGS: NG tube tip is in the distal esophagus at the gastroesophageal junction.  Dilated small bowel is seen in the right side of the abdomen. No visible free air or free fluid. No acute osseous abnormality.  IMPRESSION: NG tube tip is in the distal esophagus.   Electronically Signed   By: Rozetta Nunnery  M.D.   On: 06/11/2013 07:36    Anti-infectives: Anti-infectives   Start     Dose/Rate Route Frequency Ordered Stop   06/05/13 1400  piperacillin-tazobactam (ZOSYN) IVPB 3.375 g  Status:  Discontinued     3.375 g 12.5 mL/hr over 240 Minutes Intravenous 3 times per day 06/05/13 1159 06/10/13 0815   06/01/13 1600  piperacillin-tazobactam (ZOSYN) IVPB 2.25 g  Status:  Discontinued     2.25 g 100 mL/hr over 30 Minutes Intravenous 4 times per day 06/01/13 1024 06/05/13 1159   05/31/13 1300  piperacillin-tazobactam (ZOSYN) IVPB 3.375 g  Status:  Discontinued     3.375 g 12.5 mL/hr over 240 Minutes Intravenous 3 times per day 05/31/13 1212 06/01/13 1025   05/21/13 0200  cefoTEtan (CEFOTAN) 2 g in dextrose 5 % 50 mL IVPB     2 g 100 mL/hr over 30 Minutes Intravenous Every 12 hours 05/20/13 1808 05/21/13 0203   05/20/13 0551  cefoTEtan (CEFOTAN) 2 g in dextrose 5 % 50 mL IVPB     2 g 100 mL/hr over 30 Minutes Intravenous On call to O.R. 05/20/13 0551 05/20/13 1345      Assessment/Plan: Problem List: Patient Active Problem List   Diagnosis Date Noted  . Rectal cancer 02/01/2013  . Anemia 07/20/2012  . Chronic renal insufficiency 07/31/2011  . EDEMA 09/21/2010  . OBSTRUCTIVE SLEEP APNEA 07/29/2010  . GOUT 05/11/2007  . ERECTILE DYSFUNCTION, MILD 05/11/2007  . HYPERTENSION 05/11/2007  . COLONIC POLYPS, HX OF 05/11/2007    Hypokalemia may be related to quantity of NG and ileostomy drainage.  May need to consider earlier takedown of ileostomy 22 Days Post-Op    LOS: 22 days   Matt B. Hassell Done, MD, [facility] [phone] beeper [phone]  06/11/2013 10:18 AM

## 2013-06-11 NOTE — Progress Notes (Addendum)
PARENTERAL NUTRITION CONSULT NOTE - FOLLOW UP  Pharmacy Consult for TNA Indication: Post-op Ileus  Allergies  Allergen Reactions  . Nsaids     Asked by surgeon to add this medication class as intolerance due to patients renal insufficiency.    Patient Measurements: Height: 5\' 7"  (170.2 cm) Weight: 197 lb 4.8 oz (89.495 kg) IBW/kg (Calculated) : 66.1 Adjusted Body Weight: 73kg Usual Weight: 104kg  Vital Signs: Temp: 98.5 F (36.9 C) (12/06 0519) Temp src: Oral (12/06 0519) BP: 152/89 mmHg (12/06 0519) Pulse Rate: 88 (12/06 0519)   Intake/Output from previous day: 12/05 0701 - 12/06 0700 In: 8023.2 [I.V.:4975.5; IV Piggyback:800; TPN:2247.7] Out: [id number] [Urine:2150; Emesis/NG output:3375; Stool:1220]  Labs:  Recent Labs  06/10/13 0552  WBC 11.6*  HGB 8.4*  HCT 24.9*  PLT 269    Recent Labs  06/09/13 0315 06/09/13 0434 06/10/13 0552 06/11/13 0550  NA  --  135  134* 135 133*  K  --  3.4*  3.4* 3.3* 3.3*  CL  --  100  100 99 97  CO2  --  22  22 24 26   GLUCOSE  --  137*  136* 145* 138*  BUN  --  38*  39* 41* 43*  CREATININE  --  2.71*  2.70* 2.89* 3.11*  CALCIUM  --  9.5  9.5 9.3 9.1  MG  --  1.6 2.0  --   PHOS  --  3.8  3.7 4.6 4.3  PROT  --  6.6  --   --   ALBUMIN  --  2.4*  2.3* 2.2* 2.2*  AST  --  10  --   --   ALT  --  15  --   --   ALKPHOS  --  134*  --   --   BILITOT  --  0.3  --   --   TRIG 178*  --   --   --    Estimated Creatinine Clearance: 25 ml/min (by C-G formula based on Cr of 3.11).    Recent Labs  06/11/13 0021 06/11/13 0412 06/11/13 0752  GLUCAP 108* 122* 128*    Medications:  Infusions:  . Marland KitchenTPN (CLINIMIX-E) Adult 83 mL/hr at 06/10/13 1702   And  . fat emulsion 250 mL (06/10/13 1703)   Insulin Requirements in the past 24 hours:   6 units SSI required - moderate scale q4h  Nutritional Goals:   Per RD recs 12/1: 1800- 2000 kcal/day, 100-110 g/day protein, 2.3-2.5 L/day fluid  Clinimix E 5/15 at a goal rate of  15ml/hr + 20% fat emulsion at 67ml/hr to provide: 100g/day protein, 1894 Kcal/day.  Current Nutrition:   Diet: NPO (starting 11/29)  Clinimix E5/15 at 83 ml/hr + 20% Lipids at 49ml/hr  IVF: Per nephrology, RN to total all urinary and GI losses and replace with 0.45% NaCl Q 4 hours.  Assessment: 37 yoM with rectal cancer s/p lap assisted LAR with coloanal anastomosis and diverting loop ileostomy, now with post-op ileus. TNA started 11/21 since surgery does not anticipate bowel function to return over next couple of days and patient has acute protein calorie malnutrition.   Glucose - CBGs at goal <150   Electrolytes - K low at 3.3 (goal >4 per nephrology), despite replacement 12/4, 12/5, likely d/t GI losses.  Monitoring closely with renal insufficiency. Na 133  Renal function: CKD stage III, renal following, SCr continues to rise, today 3.11, CrCl 25.  UOP 62ml/kg/hr.  +1.3L.   LFTs -  WNL on 12/4.  Alk phos elevated but improving.  TGs - 178 (12/4), 179 (12/1), 165 (11/27), 238 (11/24), 225 (11/22) - ok to continue reduced rate lipids for now but will monitor closely.  Prealbumin - improving.  9.8 (11/20), 11.3 (11/22), 14.2 (11/24), 18.1 (12/1)  12/6  Surgery recommends continued NPO, NG tube replaced 12/3 for early post-op bowel obstruction.  Monitor fluids and electrolytes closely w/ CKD Stage III, but maintenance IVF is a replacement for GI and GU output only.  UOP ok, large amount of NG and ileostomy output with net I/O yesterday = +3.3L.  TG slightly elevated but stable; cont lower dose lipids with recheck on Monday.  TPN Access: Double lumen PICC placed 11/20 TPN day#: 16  Plan: At 1800 today:  Continue Clinimix E 5/15 at goal rate 83 ml/hr   Continue electrolytes in TPN at this time but monitor closely  TNA to contain standard multivitamins and trace elements.  20% fat emulsion at 8 ml/hr.  Recheck TG on Monday with TNA labs  Continue IVF per MD  KCl 38mEq/L x 6 now  and recheck BMET at 1800.  Replace further potassium as needed depending.   Continue moderate scale SSI q 4h  TNA lab panels on Mondays & Thursdays.  Check CMET, mag, phos tomorrow  Ralene Bathe, PharmD, BCPS 06/11/2013, 10:51 AM  Pager: [phone]

## 2013-06-11 NOTE — Progress Notes (Signed)
  [facility] Progress Note    Subjective: I/O's +1300, still over  6L out per day with urine and GI losses. K+ still low at 3.3.  Pt without complaints, walks in halls   Exam: bp 173/84, pulse 91, temperature 99.2 F (37.3 C), temperature source Oral, resp. rate 16, height 5\' 7"  (1.702 m), weight 86.637 kg (191 lb), SpO2 100.00%.  gen: alert, no distress, fully oriented  neck: no jvd, jvp 10 cm  chest: clear bilat  cor: regular, no rub or M  abd: ostomy RLQ, moderate distension, nontender  ext: no leg or UE edema  neuro: nf, ox3    UA > 30 protein, 3-6 wbc/rbc, rare bact  UNa > 58   Ucreat > 108  Renal US > 9/1 / 10.7cm kidneys, no hydro, nl echo  CXR 12/2 > lungs clear  Assessment/Plan:  1. Acute on CKD due to vol depletion- creat now 3.1, stop allopurinol, cont IVF's cc:cc minus 250 cc every 4 hours. BP is rising due to vol expansion, so will decrease IV's slightly to effect equal in's and out's hopefully.  2. Stage 3 CKD, baseline creat 1.8-2.1 3. Anemia due to ABL/CKD: Hb 9's, with low iron stores > IV iron 250 mg x 2 4. Rectal/colon cancer, S/P LAR w diverting ileostomy 05/23/13 5. Post-op ileus: persistent, per surg 6. Hypokalemia: will add 40 KCl per liter of replacement fluids also    Kelly Splinter MD  pager [phone]    cell [phone]  06/11/2013, 6:44 PM   Recent Labs Lab 06/09/13 0434 06/10/13 0552 06/11/13 0550  NA 135  134* 135 133*  K 3.4*  3.4* 3.3* 3.3*  CL 100  100 99 97  CO2 22  22 24 26   GLUCOSE 137*  136* 145* 138*  BUN 38*  39* 41* 43*  CREATININE 2.71*  2.70* 2.89* 3.11*  CALCIUM 9.5  9.5 9.3 9.1  PHOS 3.8  3.7 4.6 4.3    Recent Labs Lab 06/06/13 0425  06/09/13 0434 06/10/13 0552 06/11/13 0550  AST 15  --  10  --   --   ALT 36  --  15  --   --   ALKPHOS 190*  --  134*  --   --   BILITOT 0.4  --  0.3  --   --   PROT 6.8  --  6.6  --   --   ALBUMIN 2.6*  < > 2.4*  2.3* 2.2* 2.2*  < > = values in this interval  not displayed.  Recent Labs Lab 06/06/13 0425 06/10/13 0552  WBC 12.1* 11.6*  NEUTROABS 9.8*  --   HGB 9.2* 8.4*  HCT 28.5* 24.9*  MCV 88.2 85.3  PLT 281 269   . sodium chloride   Intravenous Q4H  . allopurinol  100 mg Oral Daily  . heparin subcutaneous  5,000 Units Subcutaneous Q8H  . insulin aspart  0-15 Units Subcutaneous Q4H   . Marland KitchenTPN (CLINIMIX-E) Adult 83 mL/hr at 06/11/13 1819   And  . fat emulsion 250 mL (06/11/13 1819)   HYDROmorphone (DILAUDID) injection, lip balm, metoprolol, ondansetron (ZOFRAN) IV, sodium chloride

## 2013-06-12 LAB — GLUCOSE, CAPILLARY
Glucose-Capillary: 123 mg/dL — ABNORMAL HIGH (ref 70–99)
Glucose-Capillary: 118 mg/dL — ABNORMAL HIGH (ref 70–99)
Glucose-Capillary: 119 mg/dL — ABNORMAL HIGH (ref 70–99)

## 2013-06-12 LAB — RENAL FUNCTION PANEL
BUN: 47 mg/dL — ABNORMAL HIGH (ref 6–23)
Chloride: 98 mEq/L (ref 96–112)
Creatinine, Ser: 3.48 mg/dL — ABNORMAL HIGH (ref 0.50–1.35)
GFR calc Af Amer: 20 mL/min — ABNORMAL LOW (ref >90)
Glucose, Bld: 128 mg/dL — ABNORMAL HIGH (ref 70–99)
Phosphorus: 4.7 mg/dL — ABNORMAL HIGH (ref 2.3–4.6)
Potassium: 3.9 mEq/L (ref 3.5–5.1)

## 2013-06-12 MED ORDER — TRACE MINERALS CR-CU-F-FE-I-MN-MO-SE-ZN IV SOLN
INTRAVENOUS | Status: AC
  Administered 2013-06-12: 18:00:00 via INTRAVENOUS
  Filled 2013-06-12: qty 2000

## 2013-06-12 MED ORDER — FAT EMULSION 20 % IV EMUL
250.0000 mL | INTRAVENOUS | Status: AC
  Filled 2013-06-12: qty 250

## 2013-06-12 MED ORDER — POTASSIUM CHLORIDE 10 MEQ/100ML IV SOLN
10.0000 meq | INTRAVENOUS | Status: AC
  Administered 2013-06-12 (×3): 10 meq via INTRAVENOUS
  Filled 2013-06-12 (×3): qty 100

## 2013-06-12 NOTE — Plan of Care (Signed)
Problem: Phase II Progression Outcomes Goal: Tolerating diet Outcome: Not Applicable Date Met:  [id number] NPO

## 2013-06-12 NOTE — Progress Notes (Signed)
[facility] Progress Note    Subjective: I/O's +1300, still over  6L out per day with urine and GI losses. K+ still low at 3.3.  Pt without complaints, walks in halls   Exam: bp 173/84, pulse 91, temperature 99.2 F (37.3 C), temperature source Oral, resp. rate 16, height 5\' 7"  (1.702 m), weight 86.637 kg (191 lb), SpO2 100.00%.  gen: alert, no distress, fully oriented  neck: no jvd, jvp 10 cm  chest: clear bilat  cor: regular, no rub or M  abd: ostomy RLQ, moderate distension, nontender  ext: no leg or UE edema  neuro: nf, ox3    UA > 30 protein, 3-6 wbc/rbc, rare bact  UNa > 58   Ucreat > 108  Renal US > 9/1 / 10.7cm kidneys, no hydro, nl echo  CXR 12/2 > lungs clear  Assessment/Plan:  1. Acute on CKD due to vol depletion- creat was down now rising again, today 3.4. No clear cause. Will stop allopurinol, cont IVF's cc:cc minus 350 cc every 4 hours. BP on high side due to vol expansion, have decreased replacement fluids as above.  Hopefully will not require RRT. Have d/w patient and answered questions. Check cxr given volume increase, though no vol overload on exam.  2. Stage 3 CKD, baseline creat 1.8-2.1 3. Anemia due to ABL/CKD: Hb 8.4, with low iron stores > IV iron 250 mg x 2 ordered, consider esa soon 4. Rectal/colon cancer, S/P LAR w diverting ileostomy 05/23/13 5. Post-op ileus: persistent NG output, per surg 6. Hypokalemia: better, have added 40 KCl per liter to replacement fluids, appreciate pharm assistance    Kelly Splinter MD  pager [phone]    cell [phone]  06/12/2013, 6:23 PM   Recent Labs Lab 06/10/13 0552 06/11/13 0550 06/11/13 1815 06/12/13 0422  NA 135 133* 131* 134*  K 3.3* 3.3* 4.0 3.9  CL 99 97 96 98  CO2 24 26 26 27   GLUCOSE 145* 138* 87 128*  BUN 41* 43* 45* 47*  CREATININE 2.89* 3.11* 3.27* 3.48*  CALCIUM 9.3 9.1 9.1 9.1  PHOS 4.6 4.3  --  4.7*    Recent Labs Lab 06/06/13 0425  06/09/13 0434 06/10/13 0552 06/11/13 0550  06/12/13 0422  AST 15  --  10  --   --   --   ALT 36  --  15  --   --   --   ALKPHOS 190*  --  134*  --   --   --   BILITOT 0.4  --  0.3  --   --   --   PROT 6.8  --  6.6  --   --   --   ALBUMIN 2.6*  < > 2.4*  2.3* 2.2* 2.2* 2.1*  < > = values in this interval not displayed.  Recent Labs Lab 06/06/13 0425 06/10/13 0552  WBC 12.1* 11.6*  NEUTROABS 9.8*  --   HGB 9.2* 8.4*  HCT 28.5* 24.9*  MCV 88.2 85.3  PLT 281 269   . allopurinol  100 mg Oral Daily  . heparin subcutaneous  5,000 Units Subcutaneous Q8H  . insulin aspart  0-15 Units Subcutaneous Q4H  . sodium chloride 0.45 % with kcl   Intravenous Q4H   . Marland KitchenTPN (CLINIMIX-E) Adult 83 mL/hr at 06/12/13 1734   And  . fat emulsion     HYDROmorphone (DILAUDID) injection, lip balm, metoprolol, ondansetron (ZOFRAN) IV, sodium chloride

## 2013-06-12 NOTE — Progress Notes (Signed)
Patient ID: Jason [name], male   DOB: [birth year], 67 y.o.   MRN: [id number] [facility] Surgery Progress Note:   23 Days Post-Op  Subjective: Mental status is clear.  Patient is feeling much better and is having more output out of his ileostomy than his NG today.   Objective: Vital signs in last 24 hours: Temp:  [98.4 F (36.9 C)-99 F (37.2 C)] 99 F (37.2 C) (12/07 0446) Pulse Rate:  [81-98] 81 (12/07 0446) Resp:  [16] 16 (12/07 0446) BP: (155-160)/(82-91) 159/85 mmHg (12/07 0446) SpO2:  [100 %] 100 % (12/07 0446) Weight:  [199 lb (90.266 kg)] 199 lb (90.266 kg) (12/07 0446)  Intake/Output from previous day: 12/06 0701 - 12/07 0700 In: 6247.4 [I.V.:3427.1; IV Piggyback:700; TPN:2120.3] Out: [id number] [Urine:1750; Emesis/NG output:3000; Stool:1100] Intake/Output this shift: Total I/O In: -  Out: 1025 [Urine:300; Emesis/NG output:550; Stool:175]  Physical Exam: Work of breathing is the same.  Less NG output.  Lab Results:  Results for orders placed during the hospital encounter of 05/20/13 (from the past 48 hour(s))  GLUCOSE, CAPILLARY     Status: Abnormal   Collection Time    06/10/13 12:00 PM      Result Value Range   Glucose-Capillary 113 (*) 70 - 99 mg/dL  GLUCOSE, CAPILLARY     Status: Abnormal   Collection Time    06/10/13  5:00 PM      Result Value Range   Glucose-Capillary 112 (*) 70 - 99 mg/dL  GLUCOSE, CAPILLARY     Status: Abnormal   Collection Time    06/10/13  8:03 PM      Result Value Range   Glucose-Capillary 124 (*) 70 - 99 mg/dL  GLUCOSE, CAPILLARY     Status: Abnormal   Collection Time    06/11/13 12:21 AM      Result Value Range   Glucose-Capillary 108 (*) 70 - 99 mg/dL   Comment 1 Notify RN     Comment 2 Documented in Chart    GLUCOSE, CAPILLARY     Status: Abnormal   Collection Time    06/11/13  4:12 AM      Result Value Range   Glucose-Capillary 122 (*) 70 - 99 mg/dL   Comment 1 Notify RN     Comment 2 Documented in Chart    RENAL FUNCTION  PANEL     Status: Abnormal   Collection Time    06/11/13  5:50 AM      Result Value Range   Sodium 133 (*) 135 - 145 mEq/L   Potassium 3.3 (*) 3.5 - 5.1 mEq/L   Chloride 97  96 - 112 mEq/L   CO2 26  19 - 32 mEq/L   Glucose, Bld 138 (*) 70 - 99 mg/dL   BUN 43 (*) 6 - 23 mg/dL   Creatinine, Ser 3.11 (*) 0.50 - 1.35 mg/dL   Calcium 9.1  8.4 - 10.5 mg/dL   Phosphorus 4.3  2.3 - 4.6 mg/dL   Albumin 2.2 (*) 3.5 - 5.2 g/dL   GFR calc non Af Amer 19 (*) >90 mL/min   GFR calc Af Amer 22 (*) >90 mL/min   Comment: (NOTE)     The eGFR has been calculated using the CKD EPI equation.     This calculation has not been validated in all clinical situations.     eGFR's persistently <90 mL/min signify possible Chronic Kidney     Disease.  GLUCOSE, CAPILLARY     Status: Abnormal  Collection Time    06/11/13  7:52 AM      Result Value Range   Glucose-Capillary 128 (*) 70 - 99 mg/dL   Comment 1 Notify RN     Comment 2 Documented in Chart    GLUCOSE, CAPILLARY     Status: Abnormal   Collection Time    06/11/13 11:45 AM      Result Value Range   Glucose-Capillary 111 (*) 70 - 99 mg/dL   Comment 1 Notify RN     Comment 2 Documented in Chart    GLUCOSE, CAPILLARY     Status: Abnormal   Collection Time    06/11/13  4:52 PM      Result Value Range   Glucose-Capillary 110 (*) 70 - 99 mg/dL   Comment 1 Documented in Chart     Comment 2 Notify RN    BASIC METABOLIC PANEL     Status: Abnormal   Collection Time    06/11/13  6:15 PM      Result Value Range   Sodium 131 (*) 135 - 145 mEq/L   Potassium 4.0  3.5 - 5.1 mEq/L   Comment: DELTA CHECK NOTED     NO VISIBLE HEMOLYSIS   Chloride 96  96 - 112 mEq/L   CO2 26  19 - 32 mEq/L   Glucose, Bld 87  70 - 99 mg/dL   BUN 45 (*) 6 - 23 mg/dL   Creatinine, Ser 3.27 (*) 0.50 - 1.35 mg/dL   Calcium 9.1  8.4 - 10.5 mg/dL   GFR calc non Af Amer 18 (*) >90 mL/min   GFR calc Af Amer 21 (*) >90 mL/min   Comment: (NOTE)     The eGFR has been calculated  using the CKD EPI equation.     This calculation has not been validated in all clinical situations.     eGFR's persistently <90 mL/min signify possible Chronic Kidney     Disease.  GLUCOSE, CAPILLARY     Status: Abnormal   Collection Time    06/11/13  7:55 PM      Result Value Range   Glucose-Capillary 104 (*) 70 - 99 mg/dL  GLUCOSE, CAPILLARY     Status: Abnormal   Collection Time    06/11/13 11:42 PM      Result Value Range   Glucose-Capillary 116 (*) 70 - 99 mg/dL  GLUCOSE, CAPILLARY     Status: Abnormal   Collection Time    06/12/13  4:17 AM      Result Value Range   Glucose-Capillary 123 (*) 70 - 99 mg/dL   Comment 1 Notify RN     Comment 2 Documented in Chart    RENAL FUNCTION PANEL     Status: Abnormal   Collection Time    06/12/13  4:22 AM      Result Value Range   Sodium 134 (*) 135 - 145 mEq/L   Potassium 3.9  3.5 - 5.1 mEq/L   Chloride 98  96 - 112 mEq/L   CO2 27  19 - 32 mEq/L   Glucose, Bld 128 (*) 70 - 99 mg/dL   BUN 47 (*) 6 - 23 mg/dL   Creatinine, Ser 3.48 (*) 0.50 - 1.35 mg/dL   Calcium 9.1  8.4 - 10.5 mg/dL   Phosphorus 4.7 (*) 2.3 - 4.6 mg/dL   Albumin 2.1 (*) 3.5 - 5.2 g/dL   GFR calc non Af Amer 17 (*) >90 mL/min   GFR calc  Af Amer 20 (*) >90 mL/min   Comment: (NOTE)     The eGFR has been calculated using the CKD EPI equation.     This calculation has not been validated in all clinical situations.     eGFR's persistently <90 mL/min signify possible Chronic Kidney     Disease.    Radiology/Results: Dg Abd Portable 1v  06/11/2013   CLINICAL DATA:  NG tube placement.  Small bowel obstruction.  EXAM: PORTABLE ABDOMEN - 1 VIEW  COMPARISON:  CT scan dated 06/07/2013 and radiographs dated 06/07/2013 and 06/06/2013  FINDINGS: NG tube tip is in the distal esophagus at the gastroesophageal junction.  Dilated small bowel is seen in the right side of the abdomen. No visible free air or free fluid. No acute osseous abnormality.  IMPRESSION: NG tube tip is in the  distal esophagus.   Electronically Signed   By: Rozetta Nunnery M.D.   On: 06/11/2013 07:36    Anti-infectives: Anti-infectives   Start     Dose/Rate Route Frequency Ordered Stop   06/05/13 1400  piperacillin-tazobactam (ZOSYN) IVPB 3.375 g  Status:  Discontinued     3.375 g 12.5 mL/hr over 240 Minutes Intravenous 3 times per day 06/05/13 1159 06/10/13 0815   06/01/13 1600  piperacillin-tazobactam (ZOSYN) IVPB 2.25 g  Status:  Discontinued     2.25 g 100 mL/hr over 30 Minutes Intravenous 4 times per day 06/01/13 1024 06/05/13 1159   05/31/13 1300  piperacillin-tazobactam (ZOSYN) IVPB 3.375 g  Status:  Discontinued     3.375 g 12.5 mL/hr over 240 Minutes Intravenous 3 times per day 05/31/13 1212 06/01/13 1025   05/21/13 0200  cefoTEtan (CEFOTAN) 2 g in dextrose 5 % 50 mL IVPB     2 g 100 mL/hr over 30 Minutes Intravenous Every 12 hours 05/20/13 1808 05/21/13 0203   05/20/13 0551  cefoTEtan (CEFOTAN) 2 g in dextrose 5 % 50 mL IVPB     2 g 100 mL/hr over 30 Minutes Intravenous On call to O.R. 05/20/13 0551 05/20/13 1345      Assessment/Plan: Problem List: Patient Active Problem List   Diagnosis Date Noted  . Rectal cancer 02/01/2013  . Anemia 07/20/2012  . Chronic renal insufficiency 07/31/2011  . EDEMA 09/21/2010  . OBSTRUCTIVE SLEEP APNEA 07/29/2010  . GOUT 05/11/2007  . ERECTILE DYSFUNCTION, MILD 05/11/2007  . HYPERTENSION 05/11/2007  . COLONIC POLYPS, HX OF 05/11/2007    The patient feels that he has taken a turn for the good.  Would see if that trend is sustained.   23 Days Post-Op    LOS: 23 days   Matt B. Hassell Done, MD, [facility] [phone] beeper [phone]  06/12/2013 11:32 AM

## 2013-06-12 NOTE — Progress Notes (Signed)
PARENTERAL NUTRITION CONSULT NOTE - FOLLOW UP  Pharmacy Consult for TNA Indication: Post-op Ileus  Allergies  Allergen Reactions  . Nsaids     Asked by surgeon to add this medication class as intolerance due to patients renal insufficiency.    Patient Measurements: Height: 5\' 7"  (170.2 cm) Weight: 199 lb (90.266 kg) IBW/kg (Calculated) : 66.1 Adjusted Body Weight: 73kg Usual Weight: 104kg  Vital Signs: Temp: 99 F (37.2 C) (12/07 0446) Temp src: Oral (12/07 0446) BP: 159/85 mmHg (12/07 0446) Pulse Rate: 81 (12/07 0446)   Intake/Output from previous day: 12/06 0701 - 12/07 0700 In: 6247.4 [I.V.:3427.1; IV Piggyback:700; TPN:2120.3] Out: [id number] [Urine:1750; Emesis/NG output:3000; [id number]  Labs:  Recent Labs  06/10/13 0552  WBC 11.6*  HGB 8.4*  HCT 24.9*  PLT 269    Recent Labs  06/10/13 0552 06/11/13 0550 06/11/13 1815 06/12/13 0422  NA 135 133* 131* 134*  K 3.3* 3.3* 4.0 3.9  CL 99 97 96 98  CO2 24 26 26 27   GLUCOSE 145* 138* 87 128*  BUN 41* 43* 45* 47*  CREATININE 2.89* 3.11* 3.27* 3.48*  CALCIUM 9.3 9.1 9.1 9.1  MG 2.0  --   --   --   PHOS 4.6 4.3  --  4.7*  ALBUMIN 2.2* 2.2*  --  2.1*   Estimated Creatinine Clearance: 22.4 ml/min (by C-G formula based on Cr of 3.48).    Recent Labs  06/11/13 1955 06/11/13 2342 06/12/13 0417  GLUCAP 104* 116* 123*    Medications:  Infusions:  . Marland KitchenTPN (CLINIMIX-E) Adult 83 mL/hr at 06/11/13 1819   And  . fat emulsion 250 mL (06/11/13 1819)   Insulin Requirements in the past 24 hours:   4 units SSI required - moderate scale q4h  Nutritional Goals:   Per RD recs 12/1: 1800- 2000 kcal/day, 100-110 g/day protein, 2.3-2.5 L/day fluid  Clinimix E 5/15 at a goal rate of 57ml/hr + 20% fat emulsion at 64ml/hr to provide: 100g/day protein, 1894 Kcal/day.  Current Nutrition:   Diet: NPO (starting 11/29)  Clinimix E5/15 at 83 ml/hr + 20% Lipids at 87ml/hr  IVF: Per nephrology, RN to total all urinary  and GI losses and replace with 0.45% NaCl with 72mEq KCl Q 4 hours.  Assessment: 49 yoM with rectal cancer s/p lap assisted LAR with coloanal anastomosis and diverting loop ileostomy, now with post-op ileus. TNA started 11/21 since surgery does not anticipate bowel function to return over next couple of days and patient has acute protein calorie malnutrition.   Glucose - CBGs at goal <150   Electrolytes - K improved to 3.9 (goal >4 per nephrology). Monitoring closely with renal insufficiency. Na 134.  Phos slightly elevated at 4.7.    Renal function: CKD stage III, renal following, SCr continues to rise, today 3.48, CrCl 23.  UOP 0.16ml/kg/hr. I/O+428ml.     LFTs - WNL on 12/4.  Alk phos elevated but improving.  TGs - 178 (12/4), 179 (12/1), 165 (11/27), 238 (11/24), 225 (11/22) - ok to continue reduced rate lipids for now but will monitor closely.  Prealbumin - improving.  9.8 (11/20), 11.3 (11/22), 14.2 (11/24), 18.1 (12/1)  12/6  Surgery recommends continued NPO, NG tube replaced 12/3 for early post-op bowel obstruction.  Monitor fluids and electrolytes closely w/ CKD Stage III, but maintenance IVF is a replacement for GI and GU output only.  UOP ok, large amount of NG and ileostomy output with net I/O yesterday = +3.3L.  TG slightly  elevated but stable; cont lower dose lipids with recheck on Monday.  TPN Access: Double lumen PICC placed 11/20 TPN day#: 17  Plan: At 1800 today:  Continue Clinimix E 5/15 at goal rate 83 ml/hr   Continue electrolytes in TPN at this time but monitor closely  TNA to contain standard multivitamins and trace elements.  20% fat emulsion at 8 ml/hr.  Recheck TG on Monday with TNA labs  Continue IVF per MD   Continue moderate scale SSI q 4h  TNA lab panels on Mondays & Thursdays.  Ralene Bathe, PharmD, BCPS 06/12/2013, 10:01 AM  Pager: [phone]

## 2013-06-13 ENCOUNTER — Telehealth (INDEPENDENT_AMBULATORY_CARE_PROVIDER_SITE_OTHER): Payer: Self-pay

## 2013-06-13 ENCOUNTER — Inpatient Hospital Stay (HOSPITAL_COMMUNITY): Payer: Medicare Other

## 2013-06-13 LAB — DIFFERENTIAL
Basophils Relative: 0 % (ref 0–1)
Eosinophils Relative: 2 % (ref 0–5)
Lymphs Abs: 0.8 10*3/uL (ref 0.7–4.0)
Monocytes Absolute: 0.8 10*3/uL (ref 0.1–1.0)
Neutro Abs: 7.8 10*3/uL — ABNORMAL HIGH (ref 1.7–7.7)
Neutrophils Relative %: 82 % — ABNORMAL HIGH (ref 43–77)

## 2013-06-13 LAB — URINALYSIS, ROUTINE W REFLEX MICROSCOPIC
Glucose, UA: NEGATIVE mg/dL
Leukocytes, UA: NEGATIVE
Protein, ur: NEGATIVE mg/dL
Specific Gravity, Urine: 1.008 (ref 1.005–1.030)
Urobilinogen, UA: 0.2 mg/dL (ref 0.0–1.0)
pH: 6 (ref 5.0–8.0)

## 2013-06-13 LAB — COMPREHENSIVE METABOLIC PANEL
ALT: 8 U/L (ref 0–53)
Albumin: 2.3 g/dL — ABNORMAL LOW (ref 3.5–5.2)
Alkaline Phosphatase: 130 U/L — ABNORMAL HIGH (ref 39–117)
Chloride: 97 mEq/L (ref 96–112)
Potassium: 4.6 mEq/L (ref 3.5–5.1)
Sodium: 136 mEq/L (ref 135–145)
Total Bilirubin: 0.2 mg/dL — ABNORMAL LOW (ref 0.3–1.2)
Total Protein: 6.2 g/dL (ref 6.0–8.3)

## 2013-06-13 LAB — URINE MICROSCOPIC-ADD ON

## 2013-06-13 LAB — RENAL FUNCTION PANEL
Albumin: 2.3 g/dL — ABNORMAL LOW (ref 3.5–5.2)
BUN: 57 mg/dL — ABNORMAL HIGH (ref 6–23)
CO2: 29 mEq/L (ref 19–32)
Chloride: 96 mEq/L (ref 96–112)
GFR calc non Af Amer: 12 mL/min — ABNORMAL LOW (ref >90)
Glucose, Bld: 126 mg/dL — ABNORMAL HIGH (ref 70–99)
Potassium: 4.6 mEq/L (ref 3.5–5.1)

## 2013-06-13 LAB — CBC
HCT: 26.1 % — ABNORMAL LOW (ref 39.0–52.0)
Hemoglobin: 8.6 g/dL — ABNORMAL LOW (ref 13.0–17.0)
MCH: 28.4 pg (ref 26.0–34.0)
MCV: 86.1 fL (ref 78.0–100.0)
RBC: 3.03 MIL/uL — ABNORMAL LOW (ref 4.22–5.81)
WBC: 9.6 10*3/uL (ref 4.0–10.5)

## 2013-06-13 LAB — MAGNESIUM: Magnesium: 1.8 mg/dL (ref 1.5–2.5)

## 2013-06-13 LAB — GLUCOSE, CAPILLARY
Glucose-Capillary: 123 mg/dL — ABNORMAL HIGH (ref 70–99)
Glucose-Capillary: 127 mg/dL — ABNORMAL HIGH (ref 70–99)
Glucose-Capillary: 111 mg/dL — ABNORMAL HIGH (ref 70–99)
Glucose-Capillary: 119 mg/dL — ABNORMAL HIGH (ref 70–99)

## 2013-06-13 LAB — TRIGLYCERIDES: Triglycerides: 147 mg/dL (ref <150)

## 2013-06-13 MED ORDER — TRACE MINERALS CR-CU-F-FE-I-MN-MO-SE-ZN IV SOLN
INTRAVENOUS | Status: AC
  Administered 2013-06-13: 18:00:00 via INTRAVENOUS
  Filled 2013-06-13: qty 2000

## 2013-06-13 MED ORDER — SODIUM CHLORIDE 0.45 % IV SOLN
INTRAVENOUS | Status: DC
  Administered 2013-06-13 – 2013-06-14 (×3): via INTRAVENOUS
  Filled 2013-06-13 (×6): qty 1000

## 2013-06-13 MED ORDER — FAT EMULSION 20 % IV EMUL
240.0000 mL | INTRAVENOUS | Status: AC
  Administered 2013-06-13: 18:00:00 240 mL via INTRAVENOUS
  Filled 2013-06-13: qty 250

## 2013-06-13 MED ORDER — CARVEDILOL 3.125 MG PO TABS
3.1250 mg | ORAL_TABLET | Freq: Two times a day (BID) | ORAL | Status: DC
  Administered 2013-06-13 – 2013-06-14 (×2): 3.125 mg via ORAL
  Filled 2013-06-13 (×4): qty 1

## 2013-06-13 NOTE — Telephone Encounter (Signed)
DR. Marcello Moores ,Wife is asking for a return call , she missed you this am.

## 2013-06-13 NOTE — Progress Notes (Signed)
NUTRITION FOLLOW UP  Intervention:   - TPN per pharmacy; see energy/protein recommendations below - Diet advancement per MD - RD to continue to monitor   Nutrition Dx:   Inadequate oral intake related to medical condition as evidenced by NPO status; ongoing    Goal:   Pt to meet >/= 90% of their estimated nutrition needs; being met  Monitor:  TPN rate, Weight, Labs, Diet advancement   Assessment:   67 year old male with rectal cancer s/ neoadjuvant chemoRt with history of HTN, Gout, diverticulosis, chronic renal insufficiency, and anemia. Pt now 7 days s/p lap assisted LAR with coloanal anastomosis and diverting loop ileostomy. Pt with post-op ileus; PICC placed 11/20 and plan to start pt on TPN tonight. Pt has been NPO since 11/14 except for one day of clear liquids on 11/17. Pt being brought to CT scan at time of visit. Per MST report, pt denied recent weight loss and decreased appetite PTA.   11/28 - S/p NGT placed 11/22 with 2L output and total output that day of 5.8L. NGT out 11/25. Nephrology following. TPN cut in half yesterday. Low fiber diet started 11/27. Met with pt who reports eating 100% of meals today including some yogurt for breakfast and some beef and soup for lunch. Requesting a snack which was ordered. Interested in getting Ensure Complete.   11/29 - Had nausea and large amount of emesis. Changed to NPO.   11/30 - Pt c/o abdominal fullness. No reports of nausea. Pt refused NGT.   12/1 - Surgeon prefers to wait until pt's abdominal distention resolved before advancing diet. Plan is to continue TPN.   12/5 - NG tube in place but, unhooked from suction at time of visit. Per MD note pt having multiple liters of ileostomy and NG output.  TPN continues: Clinimix E5/15 at 83 ml/hr + 20% Lipids at 10ml/hr which provides 1798 kcal and 100 grams of protein. This meets 100% of estimated energy needs and 100% of estimated protein needs. Lipids continue at reduced rate due to  elevated triglycerides  12/8 Per chart acute on CKD due to volume depletion. Per MD note pt still having multiple liters of NG and ostomy output; bowel obstruction possibly resolving- will clamp tube and measure residuals today. Reduced protein needs slightly for now until kidney function returns to baseline.   - Triglycerides now WNL (147 mg/dL on 12/8) - High Phosphorus - BUN/Cr elevated and trending up - CBGs < 150mg /dL today - Low albumin  CBG (last 3)   Recent Labs  06/13/13 0013 06/13/13 0402 06/13/13 0746  GLUCAP 114* 123* 127*     Height: Ht Readings from Last 1 Encounters:  05/20/13 5\' 7"  (1.702 m)    Weight Status:   Wt Readings from Last 1 Encounters:  06/13/13 198 lb (89.812 kg)  Admit wt:        199 lb (90.26 kg)   Re-estimated needs:  Kcal: 1800- 2000  Protein: 90-100 grams  Fluid: 2.3-2.5 L/day   Skin: Rectal and abdominal incision, facial edema  Diet Order: Clear Liquid   Intake/Output Summary (Last 24 hours) at 06/13/13 1147 Last data filed at 06/13/13 1000  Gross per 24 hour  Intake 4786.5 ml  Output   3950 ml  Net  836.5 ml    Last BM: 12/6 from ileostomy   Labs:   Recent Labs Lab 06/09/13 0434 06/10/13 0552 06/11/13 0550 06/11/13 1815 06/12/13 0422 06/13/13 0410  NA 135  134* 135 133* 131*  134* 136  135  K 3.4*  3.4* 3.3* 3.3* 4.0 3.9 4.6  4.6  CL 100  100 99 97 96 98 97  96  CO2 22  22 24 26 26 27 29  29   BUN 38*  39* 41* 43* 45* 47* 57*  57*  CREATININE 2.71*  2.70* 2.89* 3.11* 3.27* 3.48* 4.46*  4.48*  CALCIUM 9.5  9.5 9.3 9.1 9.1 9.1 9.6  9.6  MG 1.6 2.0  --   --   --  1.8  PHOS 3.8  3.7 4.6 4.3  --  4.7* 5.0*  4.9*  GLUCOSE 137*  136* 145* 138* 87 128* 129*  126*    CBG (last 3)   Recent Labs  06/13/13 0013 06/13/13 0402 06/13/13 0746  GLUCAP 114* 123* 127*    Scheduled Meds: . heparin subcutaneous  5,000 Units Subcutaneous Q8H  . insulin aspart  0-15 Units Subcutaneous Q4H  . sodium  chloride 0.45 % with kcl   Intravenous Q4H    Continuous Infusions: . Marland KitchenTPN (CLINIMIX-E) Adult 83 mL/hr at 06/12/13 1734   And  . fat emulsion      Pryor Ochoa RD, LDN Inpatient Clinical Dietitian Pager: [phone] After Hours Pager: [phone]

## 2013-06-13 NOTE — Progress Notes (Signed)
Patient ID: Jason [name], male   DOB: [birth year], 67 y.o.   MRN: [id number]  [facility] Progress Note    Assessment/ Plan:   1. AKI on CKD3: Appears to have initially been hemodynamically mediated acute renal failure from high output ostomy/hypotension that responded well to intravenous fluids. Difficult to explain his more recent rise of creatinine except for the possibility of ischemic/hemodynamically mediated ATN. I will recheck a urinalysis today. No utility for repeat renal ultrasound at this point with a Foley catheter in place and last ultrasound of 05/31/2013 appearing normal. Urine output is fair and electrolytes are not critical. Volume status appears to be close to euvolemic. 2. Rectal/Colon cancer s/p LAR with diverting colostomy 3. Anemia: Low hemoglobin status post surgery-trending down but without any acute drops. 4. Hypertension: Elevated blood pressures noted-this is most possibly due to volume expansion. Will consider low dose beta blocker (previously on atenolol).  Subjective:   Reports to be feeling well-excited that he was started on sips and chips by mouth today.    Objective:   BP 156/87  Pulse 101  Temp(Src) 99 F (37.2 C) (Oral)  Resp 20  Ht 5\' 7"  (1.702 m)  Wt 89.812 kg (198 lb)  BMI 31.00 kg/m2  SpO2 100%  Intake/Output Summary (Last 24 hours) at 06/13/13 1416 Last data filed at 06/13/13 1313  Gross per 24 hour  Intake 4715.21 ml  Output   3450 ml  Net 1265.21 ml   Weight change: -0.454 kg (-1 lb)  Physical Exam: Gen: Pleasant man resting comfortably in bed-NG tube noted CVS: Pulse regular tachycardia, S1 and S2 with an ejection systolic murmur Resp: Clear to auscultation bilaterally-no rales Abd: Soft, slightly distended, right lower quadrant ostomy. Bowel sounds normal Ext: No edema over either upper or lower extremities  Imaging: Dg Chest Port 1 View  06/13/2013   CLINICAL DATA:  Acute and chronic kidney disease, received fluid  volume, question edema  EXAM: PORTABLE CHEST - 1 VIEW  COMPARISON:  Portable exam 0556 hr compared to 06/07/2013  FINDINGS: Right arm PICC line tip projects over distal SVC.  Nasogastric tube extends into stomach.  Normal heart size, mediastinal contours, and pulmonary vascularity for technique.  Lungs clear.  No pleural effusion or pneumothorax.  IMPRESSION: No acute abnormalities.   Electronically Signed   By: Lavonia Dana M.D.   On: 06/13/2013 07:16   Dg Abd Portable 1v  06/13/2013   CLINICAL DATA:  NG placement  EXAM: PORTABLE ABDOMEN - 1 VIEW  COMPARISON:  06/11/2013  FINDINGS: NG tube is coiled in the stomach.  Progressive small bowel dilatation in the right abdomen. Colon is decompressed.  IMPRESSION: Progression of small bowel obstruction pattern.  NG in the stomach.   Electronically Signed   By: Franchot Gallo M.D.   On: 06/13/2013 07:59    Labs: BMET  Recent Labs Lab 06/07/13 0918 06/08/13 0925 06/09/13 0434 06/10/13 0552 06/11/13 0550 06/11/13 1815 06/12/13 0422 06/13/13 0410  NA 133* 132* 135  134* 135 133* 131* 134* 136  135  K 3.6 4.2 3.4*  3.4* 3.3* 3.3* 4.0 3.9 4.6  4.6  CL 101 100 100  100 99 97 96 98 97  96  CO2 21 19 22  22 24 26 26 27 29  29   GLUCOSE 119* 114* 137*  136* 145* 138* 87 128* 129*  126*  BUN 35* 36* 38*  39* 41* 43* 45* 47* 57*  57*  CREATININE 2.72* 2.75* 2.71*  2.70* 2.89* 3.11* 3.27* 3.48* 4.46*  4.48*  CALCIUM 9.6 9.6 9.5  9.5 9.3 9.1 9.1 9.1 9.6  9.6  PHOS 3.8 4.6 3.8  3.7 4.6 4.3  --  4.7* 5.0*  4.9*   CBC  Recent Labs Lab 06/10/13 0552 06/13/13 0410  WBC 11.6* 9.6  NEUTROABS  --  7.8*  HGB 8.4* 8.6*  HCT 24.9* 26.1*  MCV 85.3 86.1  PLT 269 241   Medications:    . heparin subcutaneous  5,000 Units Subcutaneous Q8H  . insulin aspart  0-15 Units Subcutaneous Q4H  . sodium chloride 0.45 % with kcl   Intravenous Q4H   Elmarie Shiley, MD 06/13/2013, 2:16 PM

## 2013-06-13 NOTE — Progress Notes (Signed)
24 Days Post-Op  Subjective: Feels better.  Stomach growling. Denies pain. Occasional nausea. Foley back in to monitor UOP closely.  Having multiple liters of ileostomy and NG output.  Getting IV fluid replacements per nephrology  Objective: Vital signs in last 24 hours: Temp:  [98.5 F (36.9 C)-99.7 F (37.6 C)] 99.5 F (37.5 C) (12/08 0532) Pulse Rate:  [85-102] 102 (12/08 0532) Resp:  [16-18] 18 (12/08 0532) BP: (157-169)/(82-92) 164/92 mmHg (12/08 0532) SpO2:  [100 %] 100 % (12/08 0532) Weight:  [198 lb (89.812 kg)] 198 lb (89.812 kg) (12/08 0500) Last BM Date: 06/11/13  Intake/Output from previous day: 12/07 0701 - 12/08 0700 In: 5565.9 [I.V.:3081.9; IV Piggyback:300; [id number] Out: [id number] [Urine:1000; Emesis/NG output:2000; [id number] Intake/Output this shift:   General appearance: alert, cooperative and no distress Resp: nonlabored GI: soft, NT, no distention, no peritoneal signs.  ostomy with thin green output. NG with clear liquid return  Lab Results:   Recent Labs  06/13/13 0410  WBC 9.6  HGB 8.6*  HCT 26.1*  PLT 241   BMET  Recent Labs  06/12/13 0422 06/13/13 0410  NA 134* 136  135  K 3.9 4.6  4.6  CL 98 97  96  CO2 27 29  29   GLUCOSE 128* 129*  126*  BUN 47* 57*  57*  CREATININE 3.48* 4.46*  4.48*  CALCIUM 9.1 9.6  9.6   PT/INR No results found for this basename: LABPROT, INR,  in the last 72 hours ABG No results found for this basename: PHART, PCO2, PO2, HCO3,  in the last 72 hours  Studies/Results: Dg Chest Port 1 View  06/13/2013   CLINICAL DATA:  Acute and chronic kidney disease, received fluid volume, question edema  EXAM: PORTABLE CHEST - 1 VIEW  COMPARISON:  Portable exam 0556 hr compared to 06/07/2013  FINDINGS: Right arm PICC line tip projects over distal SVC.  Nasogastric tube extends into stomach.  Normal heart size, mediastinal contours, and pulmonary vascularity for technique.  Lungs clear.  No pleural effusion or  pneumothorax.  IMPRESSION: No acute abnormalities.   Electronically Signed   By: Lavonia Dana M.D.   On: 06/13/2013 07:16   Dg Abd Portable 1v  06/13/2013   CLINICAL DATA:  NG placement  EXAM: PORTABLE ABDOMEN - 1 VIEW  COMPARISON:  06/11/2013  FINDINGS: NG tube is coiled in the stomach.  Progressive small bowel dilatation in the right abdomen. Colon is decompressed.  IMPRESSION: Progression of small bowel obstruction pattern.  NG in the stomach.   Electronically Signed   By: Franchot Gallo M.D.   On: 06/13/2013 07:59    Anti-infectives: Anti-infectives   Start     Dose/Rate Route Frequency Ordered Stop   06/05/13 1400  piperacillin-tazobactam (ZOSYN) IVPB 3.375 g  Status:  Discontinued     3.375 g 12.5 mL/hr over 240 Minutes Intravenous 3 times per day 06/05/13 1159 06/10/13 0815   06/01/13 1600  piperacillin-tazobactam (ZOSYN) IVPB 2.25 g  Status:  Discontinued     2.25 g 100 mL/hr over 30 Minutes Intravenous 4 times per day 06/01/13 1024 06/05/13 1159   05/31/13 1300  piperacillin-tazobactam (ZOSYN) IVPB 3.375 g  Status:  Discontinued     3.375 g 12.5 mL/hr over 240 Minutes Intravenous 3 times per day 05/31/13 1212 06/01/13 1025   05/21/13 0200  cefoTEtan (CEFOTAN) 2 g in dextrose 5 % 50 mL IVPB     2 g 100 mL/hr over 30 Minutes Intravenous Every 12 hours 05/20/13  1808 05/21/13 0203   05/20/13 0551  cefoTEtan (CEFOTAN) 2 g in dextrose 5 % 50 mL IVPB     2 g 100 mL/hr over 30 Minutes Intravenous On call to O.R. 05/20/13 0551 05/20/13 1345      Assessment/Plan: s/p Procedure(s): LAPAROSCOPIC LOW ANTERIOR RESECTION WITH SPLENIC FLEXURE MOBILIZATION (N/A) diverting OSTOMY (N/A) CT last week showed persistent dilated loops of small bowel with transition point, decompressed small bowel distally.  AXR this am with some persistent dilation but better than 2 days ago.  Early post op bowel obstruction: possibly resolving, will clamp tube and measure residuals today.  Unclamp for nausea,  distention Renal labs worse today.  UOP down slightly yesterday.  IVF's per nephrology, appreciate assistance.  Monitor UOP closely.   Hypokalemia- being replaced via IV Cont TPN due to inability to take PO, protein calorie malnutrition  Cont ambulation   LOS: 24 days    Alilah Mcmeans C. [id number]

## 2013-06-13 NOTE — Progress Notes (Signed)
PARENTERAL NUTRITION CONSULT NOTE - FOLLOW UP  Pharmacy Consult for TNA Indication: Post-op Ileus  Allergies  Allergen Reactions  . Nsaids     Asked by surgeon to add this medication class as intolerance due to patients renal insufficiency.    Patient Measurements: Height: 5\' 7"  (170.2 cm) Weight: 198 lb (89.812 kg) IBW/kg (Calculated) : 66.1 Adjusted Body Weight: 73kg Usual Weight: 104kg  Vital Signs: Temp: 99.5 F (37.5 C) (12/08 0532) Temp src: Oral (12/08 0532) BP: 164/92 mmHg (12/08 0532) Pulse Rate: 102 (12/08 0532)   Intake/Output from previous day: 12/07 0701 - 12/08 0700 In: 5565.9 [I.V.:3081.9; IV Piggyback:300; [id number] Out: [id number] [Urine:1000; Emesis/NG output:2000; [id number]  Labs:  Recent Labs  06/13/13 0410  WBC 9.6  HGB 8.6*  HCT 26.1*  PLT 241    Recent Labs  06/11/13 0550 06/11/13 1815 06/12/13 0422 06/13/13 0410  NA 133* 131* 134* 136  135  K 3.3* 4.0 3.9 4.6  4.6  CL 97 96 98 97  96  CO2 26 26 27 29  29   GLUCOSE 138* 87 128* 129*  126*  BUN 43* 45* 47* 57*  57*  CREATININE 3.11* 3.27* 3.48* 4.46*  4.48*  CALCIUM 9.1 9.1 9.1 9.6  9.6  MG  --   --   --  1.8  PHOS 4.3  --  4.7* 5.0*  4.9*  PROT  --   --   --  6.2  ALBUMIN 2.2*  --  2.1* 2.3*  2.3*  AST  --   --   --  10  ALT  --   --   --  8  ALKPHOS  --   --   --  130*  BILITOT  --   --   --  0.2*  TRIG  --   --   --  147   Estimated Creatinine Clearance: 17.4 ml/min (by C-G formula based on Cr of 4.46).    Recent Labs  06/13/13 0013 06/13/13 0402 06/13/13 0746  GLUCAP 114* 123* 127*   Insulin Requirements in the past 24 hours:   4 units SSI required - moderate scale q4h  Nutritional Goals:   Per RD recs updated 12/8: 1800- 2000 kcal/day, 90-100 g/day protein, 2.3-2.5 L/day fluid  Clinimix E 5/15 at a goal rate of 75 ml/hr + 20% fat emulsion at 47ml/hr to provide: 90g/day protein, 1758 Kcal/day.  Current Nutrition:   Diet: NPO (starting  11/29)  Clinimix E5/15 at 83 ml/hr + 20% Lipids at 35ml/hr  IVF: Per nephrology, RN to total all urinary and GI losses and replace with 0.45% NaCl with 68mEq KCl Q 4 hours.  Assessment: 3 yoM with rectal cancer s/p lap assisted LAR with coloanal anastomosis and diverting loop ileostomy, now with post-op ileus. TNA started 11/21 since surgery does not anticipate bowel function to return over next couple of days and patient has acute protein calorie malnutrition.   Glucose - CBGs at goal <150   Electrolytes - K improved to 4.6 (goal >4 per nephrology). Monitoring closely with renal insufficiency.  Phos elevated at 5, Cal x Phos product = 48.   Renal function: CKD stage III, renal following, SCr continues to rise, today 4.46, CrCl 17.  Baseline Scr 1.8 to 2.1 per Renal. UOP 0.5 ml/kg/hr  LFTs - WNL. Alk phos elevated but improving.  TGs - improved to 147 on 12/8   Prealbumin - improving.  9.8 (11/20), 11.3 (11/22), 14.2 (11/24), 18.1 (12/1), pending lab 12/8  12/8: Surgery noted resolving post-op bowel obstruction, will clamp tube, measure residuals today, diet advanced to clear liquid. Scr trending up, 1175 cc from ileostomy yesterday, 2000 cc from NG.  Monitor fluids and electrolytes closely w/ CKD Stage III, but maintenance IVF is a replacement for GI and GU output only.  TG improving. Discussed with RD today, given rise in Scr - will try to lower protein requirements slightly until Scr returns to baseline - see new RD recommendation above.  TPN Access: Double lumen PICC placed 11/20 TPN day#: 18  Plan:  Change to Clinimix 5/15 (without electrolytes) at goal rate 75 ml/hr   TNA to contain standard multivitamins and trace elements.  Monitor electrolytes closely  Change IV fat emulsion to 20% at 10 ml/hr given improvement in TG  Continue IVF per MD   Continue moderate scale SSI q 4h  TNA lab panels on Mondays & Thursdays.  Vanessa [place], PharmD, BCPS Pager: [phone] 8:34  AM Pharmacy #: [phone]

## 2013-06-13 NOTE — Progress Notes (Signed)
Patient clamped as ordered, checked residual after 6hours 550cc output. Continue fluid replacement per nephrology order after calculation fluid rate increased to 200cc. Patient stated on clear liq diet, tolerated it well, no C/O N/V after eating. Ambulated in the hall X2.

## 2013-06-14 LAB — RENAL FUNCTION PANEL
Albumin: 2.2 g/dL — ABNORMAL LOW (ref 3.5–5.2)
BUN: 68 mg/dL — ABNORMAL HIGH (ref 6–23)
CO2: 28 mEq/L (ref 19–32)
Calcium: 9.1 mg/dL (ref 8.4–10.5)
Chloride: 96 mEq/L (ref 96–112)
Creatinine, Ser: 5.68 mg/dL — ABNORMAL HIGH (ref 0.50–1.35)
GFR calc Af Amer: 11 mL/min — ABNORMAL LOW (ref >90)
Phosphorus: 4 mg/dL (ref 2.3–4.6)

## 2013-06-14 LAB — GLUCOSE, CAPILLARY
Glucose-Capillary: 106 mg/dL — ABNORMAL HIGH (ref 70–99)
Glucose-Capillary: 112 mg/dL — ABNORMAL HIGH (ref 70–99)
Glucose-Capillary: 114 mg/dL — ABNORMAL HIGH (ref 70–99)
Glucose-Capillary: 114 mg/dL — ABNORMAL HIGH (ref 70–99)
Glucose-Capillary: 115 mg/dL — ABNORMAL HIGH (ref 70–99)

## 2013-06-14 MED ORDER — SODIUM CHLORIDE 0.9 % IV SOLN: 250.0000 mL | INTRAVENOUS | Status: DC | PRN

## 2013-06-14 MED ORDER — FAT EMULSION 20 % IV EMUL
240.0000 mL | INTRAVENOUS | Status: AC
  Administered 2013-06-14: 17:00:00 240 mL via INTRAVENOUS
  Filled 2013-06-14: qty 250

## 2013-06-14 MED ORDER — SODIUM CHLORIDE 0.45 % IV SOLN
INTRAVENOUS | Status: DC
  Filled 2013-06-14 (×2): qty 1000

## 2013-06-14 MED ORDER — SODIUM CHLORIDE 0.9 % IJ SOLN
3.0000 mL | INTRAMUSCULAR | Status: DC | PRN
  Administered 2013-06-15 – 2013-06-24 (×2): 3 mL via INTRAVENOUS

## 2013-06-14 MED ORDER — SODIUM CHLORIDE 0.9 % IJ SOLN
3.0000 mL | Freq: Two times a day (BID) | INTRAMUSCULAR | Status: DC
  Administered 2013-06-20 – 2013-06-21 (×2): 3 mL via INTRAVENOUS

## 2013-06-14 MED ORDER — TRACE MINERALS CR-CU-F-FE-I-MN-MO-SE-ZN IV SOLN
INTRAVENOUS | Status: DC
  Administered 2013-06-14: 17:00:00 via INTRAVENOUS
  Filled 2013-06-14: qty 2000

## 2013-06-14 NOTE — Progress Notes (Signed)
25 Days Post-Op  Subjective: Having some crampy pain. Occasional nausea. NG clamped but output still high a slightly bilious Objective: Vital signs in last 24 hours: Temp:  [97.8 F (36.6 C)-99.6 F (37.6 C)] 99.1 F (37.3 C) (12/09 0431) Pulse Rate:  [90-107] 106 (12/09 0431) Resp:  [16-20] 16 (12/09 0431) BP: (142-156)/(77-94) 142/78 mmHg (12/09 0431) SpO2:  [100 %] 100 % (12/09 0431) Weight:  [201 lb 4.8 oz (91.309 kg)] 201 lb 4.8 oz (91.309 kg) (12/09 0431) Last BM Date: 06/13/13 (ileostomy)  Intake/Output from previous day: 12/08 0701 - 12/09 0700 In: 4594.7 [P.O.:120; I.V.:2249.5; TPN:2225.3] Out: 4000 [Urine:925; Emesis/NG output:1775; [id number] Intake/Output this shift:   General appearance: alert, cooperative and no distress Resp: nonlabored GI: soft, NT, no distention, no peritoneal signs.  ostomy with thin green output. NG with bile tinged clear liquid return  Lab Results:   Recent Labs  06/13/13 0410  WBC 9.6  HGB 8.6*  HCT 26.1*  PLT 241   BMET  Recent Labs  06/13/13 0410 06/14/13 0625  NA 136  135 133*  K 4.6  4.6 5.5*  CL 97  96 96  CO2 29  29 28   GLUCOSE 129*  126* 119*  BUN 57*  57* 68*  CREATININE 4.46*  4.48* 5.68*  CALCIUM 9.6  9.6 9.1   PT/INR No results found for this basename: LABPROT, INR,  in the last 72 hours ABG No results found for this basename: PHART, PCO2, PO2, HCO3,  in the last 72 hours  Studies/Results: Dg Chest Port 1 View  06/13/2013   CLINICAL DATA:  Acute and chronic kidney disease, received fluid volume, question edema  EXAM: PORTABLE CHEST - 1 VIEW  COMPARISON:  Portable exam 0556 hr compared to 06/07/2013  FINDINGS: Right arm PICC line tip projects over distal SVC.  Nasogastric tube extends into stomach.  Normal heart size, mediastinal contours, and pulmonary vascularity for technique.  Lungs clear.  No pleural effusion or pneumothorax.  IMPRESSION: No acute abnormalities.   Electronically Signed   By: Lavonia Dana M.D.   On: 06/13/2013 07:16   Dg Abd Portable 1v  06/13/2013   CLINICAL DATA:  NG placement  EXAM: PORTABLE ABDOMEN - 1 VIEW  COMPARISON:  06/11/2013  FINDINGS: NG tube is coiled in the stomach.  Progressive small bowel dilatation in the right abdomen. Colon is decompressed.  IMPRESSION: Progression of small bowel obstruction pattern.  NG in the stomach.   Electronically Signed   By: Franchot Gallo M.D.   On: 06/13/2013 07:59    Anti-infectives: Anti-infectives   Start     Dose/Rate Route Frequency Ordered Stop   06/05/13 1400  piperacillin-tazobactam (ZOSYN) IVPB 3.375 g  Status:  Discontinued     3.375 g 12.5 mL/hr over 240 Minutes Intravenous 3 times per day 06/05/13 1159 06/10/13 0815   06/01/13 1600  piperacillin-tazobactam (ZOSYN) IVPB 2.25 g  Status:  Discontinued     2.25 g 100 mL/hr over 30 Minutes Intravenous 4 times per day 06/01/13 1024 06/05/13 1159   05/31/13 1300  piperacillin-tazobactam (ZOSYN) IVPB 3.375 g  Status:  Discontinued     3.375 g 12.5 mL/hr over 240 Minutes Intravenous 3 times per day 05/31/13 1212 06/01/13 1025   05/21/13 0200  cefoTEtan (CEFOTAN) 2 g in dextrose 5 % 50 mL IVPB     2 g 100 mL/hr over 30 Minutes Intravenous Every 12 hours 05/20/13 1808 05/21/13 0203   05/20/13 0551  cefoTEtan (CEFOTAN) 2 g  in dextrose 5 % 50 mL IVPB     2 g 100 mL/hr over 30 Minutes Intravenous On call to O.R. 05/20/13 0551 05/20/13 1345      Assessment/Plan: s/p Procedure(s): LAPAROSCOPIC LOW ANTERIOR RESECTION WITH SPLENIC FLEXURE MOBILIZATION (N/A) diverting OSTOMY (N/A) CT last week showed persistent dilated loops of small bowel with transition point, decompressed small bowel distally.  Early post op bowel obstruction: possibly resolving, NG output more clear.  NG clamped yesterday but residuals still high and patient experiencing some more abd pain this am.  Will place NG back to [facility] and get SBFT Renal labs worse agian today.  UOP about the same.  IVF's per  nephrology, appreciate assistance.  Monitor UOP closely.   Cont TPN due to inability to take PO, protein calorie malnutrition  Cont ambulation   LOS: 25 days    Rosaline Ezekiel C. [id number]

## 2013-06-14 NOTE — Progress Notes (Signed)
Patient ID: Jason [name], male   DOB: [birth year], 67 y.o.   MRN: [id number]   [facility] Progress Note    Assessment/ Plan:   1. AKI on CKD3: Appears to have initially been hemodynamically mediated acute renal failure from high output ostomy/hypotension that responded well to intravenous fluids.I suspect that his more recent worsening of renal function is from ATN. Urinalysis yesterday without critical or concerning findings. He is still non-oliguric and clinically appears to be euvolemic. He has had significant NG tube losses however intake and output appear to be closely matched-even. We'll leave his intravenous fluids (primarily TPN) at the current rate and discussed earlier with pharmacy with regards to providing him with no electrolytes as well as discontinuing his 1/2 NS plus potassium chloride infusion with worsening hyponatremia and hyperkalemia. He remains mentally sound and without any features of uremia. We'll continue to monitor him closely anticipating that his creatinine will plateau at some point before improving. No dialysis needs at this time-medication list reviewed and no nephrotoxins identified.  2. Rectal/Colon cancer s/p LAR with diverting colostomy  3. Anemia: Low hemoglobin status post surgery-appears to be stable, no indications for PRBCs at this point. NG tube suction appears to be blood tinged-possible overt loss. 4. Hypertension: Blood pressures elevated-he is back n.p.o. and I will discontinue carvedilol at this point. He has when necessary metoprolol.   Subjective:   Reports to be feeling well, denies any abdominal pain. NG tube put back to low intermittent suction.    Objective:   BP 142/78  Pulse 106  Temp(Src) 99.1 F (37.3 C) (Oral)  Resp 16  Ht 5\' 7"  (1.702 m)  Wt 91.309 kg (201 lb 4.8 oz)  BMI 31.52 kg/m2  SpO2 100%  Intake/Output Summary (Last 24 hours) at 06/14/13 1333 Last data filed at 06/14/13 0950  Gross per 24 hour  Intake  3125.61 ml  Output   3350 ml  Net -224.39 ml   Weight change: 1.497 kg (3 lb 4.8 oz)  Physical Exam: Gen: Comfortably resting in bed-nurse by bedside CVS: Pulse regular tachycardia. S1 and S2 with an ejection systolic murmur Resp: Clear to auscultation bilaterally-no rales/rhonchi Abd: Soft, distended, bowel sounds scant Ext: No lower extremity edema. No edema over his back.  Imaging: Dg Chest Port 1 View  06/13/2013   CLINICAL DATA:  Acute and chronic kidney disease, received fluid volume, question edema  EXAM: PORTABLE CHEST - 1 VIEW  COMPARISON:  Portable exam 0556 hr compared to 06/07/2013  FINDINGS: Right arm PICC line tip projects over distal SVC.  Nasogastric tube extends into stomach.  Normal heart size, mediastinal contours, and pulmonary vascularity for technique.  Lungs clear.  No pleural effusion or pneumothorax.  IMPRESSION: No acute abnormalities.   Electronically Signed   By: Lavonia Dana M.D.   On: 06/13/2013 07:16   Dg Abd Portable 1v  06/13/2013   CLINICAL DATA:  NG placement  EXAM: PORTABLE ABDOMEN - 1 VIEW  COMPARISON:  06/11/2013  FINDINGS: NG tube is coiled in the stomach.  Progressive small bowel dilatation in the right abdomen. Colon is decompressed.  IMPRESSION: Progression of small bowel obstruction pattern.  NG in the stomach.   Electronically Signed   By: Franchot Gallo M.D.   On: 06/13/2013 07:59    Labs: BMET  Recent Labs Lab 06/08/13 0925 06/09/13 0434 06/10/13 0552 06/11/13 0550 06/11/13 1815 06/12/13 0422 06/13/13 0410 06/14/13 0625  NA 132* 135  134* 135 133* 131* 134* 136  135 133*  K 4.2 3.4*  3.4* 3.3* 3.3* 4.0 3.9 4.6  4.6 5.5*  CL 100 100  100 99 97 96 98 97  96 96  CO2 19 22  22 24 26 26 27 29  29 28   GLUCOSE 114* 137*  136* 145* 138* 87 128* 129*  126* 119*  BUN 36* 38*  39* 41* 43* 45* 47* 57*  57* 68*  CREATININE 2.75* 2.71*  2.70* 2.89* 3.11* 3.27* 3.48* 4.46*  4.48* 5.68*  CALCIUM 9.6 9.5  9.5 9.3 9.1 9.1 9.1 9.6   9.6 9.1  PHOS 4.6 3.8  3.7 4.6 4.3  --  4.7* 5.0*  4.9* 4.0   CBC  Recent Labs Lab 06/10/13 0552 06/13/13 0410  WBC 11.6* 9.6  NEUTROABS  --  7.8*  HGB 8.4* 8.6*  HCT 24.9* 26.1*  MCV 85.3 86.1  PLT 269 241   Medications:    . carvedilol  3.125 mg Oral BID WC  . heparin subcutaneous  5,000 Units Subcutaneous Q8H  . insulin aspart  0-15 Units Subcutaneous Q4H  . sodium chloride  3 mL Intravenous Q12H   Elmarie Shiley, MD 06/14/2013, 1:33 PM

## 2013-06-14 NOTE — Progress Notes (Signed)
PARENTERAL NUTRITION CONSULT NOTE - FOLLOW UP  Pharmacy Consult for TNA Indication: Post-op Ileus  Allergies  Allergen Reactions  . Nsaids     Asked by surgeon to add this medication class as intolerance due to patients renal insufficiency.    Patient Measurements: Height: 5\' 7"  (170.2 cm) Weight: 201 lb 4.8 oz (91.309 kg) IBW/kg (Calculated) : 66.1 Adjusted Body Weight: 73kg Usual Weight: 104kg  Vital Signs: Temp: 99.1 F (37.3 C) (12/09 0431) Temp src: Oral (12/09 0431) BP: 142/78 mmHg (12/09 0431) Pulse Rate: 106 (12/09 0431)   Intake/Output from previous day: 12/08 0701 - 12/09 0700 In: 4594.7 [P.O.:120; I.V.:2249.5; TPN:2225.3] Out: 4000 [Urine:925; Emesis/NG output:1775; Stool:1300]  Labs:  Recent Labs  06/13/13 0410  WBC 9.6  HGB 8.6*  HCT 26.1*  PLT 241    Recent Labs  06/12/13 0422 06/13/13 0410 06/14/13 0625  NA 134* 136  135 133*  K 3.9 4.6  4.6 5.5*  CL 98 97  96 96  CO2 27 29  29 28   GLUCOSE 128* 129*  126* 119*  BUN 47* 57*  57* 68*  CREATININE 3.48* 4.46*  4.48* 5.68*  CALCIUM 9.1 9.6  9.6 9.1  MG  --  1.8  --   PHOS 4.7* 5.0*  4.9* 4.0  PROT  --  6.2  --   ALBUMIN 2.1* 2.3*  2.3* 2.2*  AST  --  10  --   ALT  --  8  --   ALKPHOS  --  130*  --   BILITOT  --  0.2*  --   PREALBUMIN  --  17.2*  --   TRIG  --  147  --     Recent Labs  06/14/13 0011 06/14/13 0414 06/14/13 0805  GLUCAP 106* 114* 114*   Insulin Requirements in the past 24 hours:   CBGs 111-129, required 4 units SSI - moderate scale q4h  Nutritional Goals:   Per RD recs updated 12/8: 1800- 2000 kcal/day, 90-100 g/day protein, 2.3-2.5 L/day fluid  Clinimix E 5/15 at a goal rate of 75 ml/hr + 20% fat emulsion at 39ml/hr to provide: 90g/day protein, 1758 Kcal/day.  Current Nutrition:   Diet: back to NPO 12/9  Clinimix 5/15 at 75 ml/hr + 20% Lipids at 96ml/hr  IVF: Saline lock  Assessment: 36 yoM with rectal cancer s/p lap assisted LAR with  coloanal anastomosis and diverting loop ileostomy, now with post-op ileus. TNA started 11/21 since surgery does not anticipate bowel function to return over next couple of days and patient has acute protein calorie malnutrition.   Glucose - CBGs at goal <150   Electrolytes - Na 133 (unable to adjust in TNA), K+ 5.5 (received VO from renal to d/c IVF with KCl). Phos improved to 4.   Renal function: CKD stage III, renal following, SCr continues to rise, today 5.68, CrCl 14.  Baseline Scr 1.8 to 2.1 per Renal. UOP 0.4 ml/kg/hr  LFTs - WNL. Alk phos elevated but improving.  TGs - improved to 147 on 12/8   Prealbumin - improving.  9.8 (11/20), 11.3 (11/22), 14.2 (11/24), 18.1 (12/1), 17.2 (12/8)  12/9:  Pt with abd pain, occasional nausea, high residual after NG clamp. Surgery changed diet back to NPO this AM and place NG. SCr continues to trend up. K+ elevated this AM.  Discussed with Renal and changes made to IVF. MD agreed to hold lytes from TNA today. F/u renal plans - ?HD candidate.    TPN Access:  Double lumen PICC placed 11/20 TPN day#: 19  Plan:  Continue Clinimix 5/15 (without electrolytes) at goal rate 75 ml/hr   TNA to contain standard multivitamins and trace elements.  Monitor electrolytes closely  Continue IV fat emulsion to 20% at 10 ml/hr   Change IVF to [place] per Renal.   Continue moderate scale SSI q4h  TNA lab panels on Mondays & Thursdays.  Vanessa [place], PharmD, BCPS Pager: [phone] 10:16 AM Pharmacy #: [phone]

## 2013-06-14 NOTE — Consult Note (Signed)
WOC ostomy consult note Stoma type/location: RLQ ileostomy Stomal assessment/size: 1 and 1/8 inches round Peristomal assessment: intact, clear Treatment options for stomal/peristomal skin: None indicated, but I am using a skin barrier ring Output liquid, green.  No longer hi-output. Ostomy pouching: 2pc. 2 and 1/4 inch system with skin barrier ring.  Additional supplies ordered to bedside. Education provided: Patient continues to be ill with NG and sleeping most of day in darkened room.  Ice chips are allowed. He will smile and joke a little with [place] Nurse, but session ends today as IV nursing team is in to hang TPN. [place] nursing team will continue to follow, and will remain available to this patient, the nursing and medical/surgical teams.  Please re-consult if needed. Thanks, Maudie Flakes, MSN, RN, [place], [place], [place] [phone])

## 2013-06-15 ENCOUNTER — Ambulatory Visit: Payer: Medicare Other | Admitting: Oncology

## 2013-06-15 ENCOUNTER — Inpatient Hospital Stay (HOSPITAL_COMMUNITY): Payer: Medicare Other

## 2013-06-15 LAB — GLUCOSE, CAPILLARY
Glucose-Capillary: 107 mg/dL — ABNORMAL HIGH (ref 70–99)
Glucose-Capillary: 110 mg/dL — ABNORMAL HIGH (ref 70–99)
Glucose-Capillary: 112 mg/dL — ABNORMAL HIGH (ref 70–99)
Glucose-Capillary: 117 mg/dL — ABNORMAL HIGH (ref 70–99)
Glucose-Capillary: 118 mg/dL — ABNORMAL HIGH (ref 70–99)
Glucose-Capillary: 119 mg/dL — ABNORMAL HIGH (ref 70–99)

## 2013-06-15 LAB — RENAL FUNCTION PANEL
Albumin: 2.5 g/dL — ABNORMAL LOW (ref 3.5–5.2)
BUN: 81 mg/dL — ABNORMAL HIGH (ref 6–23)
Calcium: 9.9 mg/dL (ref 8.4–10.5)
Chloride: 92 mEq/L — ABNORMAL LOW (ref 96–112)
Creatinine, Ser: 7.3 mg/dL — ABNORMAL HIGH (ref 0.50–1.35)
Glucose, Bld: 121 mg/dL — ABNORMAL HIGH (ref 70–99)
Phosphorus: 4.6 mg/dL (ref 2.3–4.6)
Potassium: 4.7 mEq/L (ref 3.5–5.1)

## 2013-06-15 MED ORDER — FAT EMULSION 20 % IV EMUL
240.0000 mL | INTRAVENOUS | Status: AC
  Administered 2013-06-15: 17:00:00 240 mL via INTRAVENOUS
  Filled 2013-06-15: qty 250

## 2013-06-15 MED ORDER — SODIUM CHLORIDE 0.9 % IV SOLN
INTRAVENOUS | Status: DC
  Administered 2013-06-15 – 2013-06-23 (×9): via INTRAVENOUS

## 2013-06-15 MED ORDER — ALTEPLASE 2 MG IJ SOLR
2.0000 mg | Freq: Once | INTRAMUSCULAR | Status: AC
  Administered 2013-06-15: 2 mg
  Filled 2013-06-15: qty 2

## 2013-06-15 MED ORDER — TRACE MINERALS CR-CU-F-FE-I-MN-MO-SE-ZN IV SOLN
INTRAVENOUS | Status: AC
  Administered 2013-06-15: 17:00:00 via INTRAVENOUS
  Filled 2013-06-15: qty 1000

## 2013-06-15 MED ORDER — TRACE MINERALS CR-CU-F-FE-I-MN-MO-SE-ZN IV SOLN: INTRAVENOUS | Status: AC

## 2013-06-15 NOTE — Progress Notes (Signed)
26 Days Post-Op  Subjective: Feeling ok, NG still with high output.    Objective: Vital signs in last 24 hours: Temp:  [98.9 F (37.2 C)-99.5 F (37.5 C)] 99.2 F (37.3 C) (12/10 0427) Pulse Rate:  [91-110] 110 (12/10 0427) Resp:  [16] 16 (12/10 0427) BP: (134-163)/(69-89) 134/69 mmHg (12/10 0427) SpO2:  [98 %-100 %] 100 % (12/10 0427) Weight:  [192 lb 11.2 oz (87.408 kg)] 192 lb 11.2 oz (87.408 kg) (12/10 0427) Last BM Date: 06/14/13  Intake/Output from previous day: 12/09 0701 - 12/10 0700 In: 2670.9 [P.O.:60; I.V.:565; NG/GT:30; TPN:2015.9] Out: [id number] [Urine:701; Emesis/NG output:2200; Stool:950] Intake/Output this shift: Total I/O In: -  Out: 150 [Stool:150] General appearance: alert, cooperative and no distress Resp: nonlabored GI: soft, NT, no distention, no peritoneal signs.  ostomy with thin green output. NG with brownish tinged liquid return  Lab Results:   Recent Labs  06/13/13 0410  WBC 9.6  HGB 8.6*  HCT 26.1*  PLT 241   BMET  Recent Labs  06/14/13 0625 06/15/13 0612  NA 133* 134*  K 5.5* 4.7  CL 96 92*  CO2 28 31  GLUCOSE 119* 121*  BUN 68* 81*  CREATININE 5.68* 7.30*  CALCIUM 9.1 9.9   PT/INR No results found for this basename: LABPROT, INR,  in the last 72 hours ABG No results found for this basename: PHART, PCO2, PO2, HCO3,  in the last 72 hours  Studies/Results: No results found.  Anti-infectives: Anti-infectives   Start     Dose/Rate Route Frequency Ordered Stop   06/05/13 1400  piperacillin-tazobactam (ZOSYN) IVPB 3.375 g  Status:  Discontinued     3.375 g 12.5 mL/hr over 240 Minutes Intravenous 3 times per day 06/05/13 1159 06/10/13 0815   06/01/13 1600  piperacillin-tazobactam (ZOSYN) IVPB 2.25 g  Status:  Discontinued     2.25 g 100 mL/hr over 30 Minutes Intravenous 4 times per day 06/01/13 1024 06/05/13 1159   05/31/13 1300  piperacillin-tazobactam (ZOSYN) IVPB 3.375 g  Status:  Discontinued     3.375 g 12.5 mL/hr over  240 Minutes Intravenous 3 times per day 05/31/13 1212 06/01/13 1025   05/21/13 0200  cefoTEtan (CEFOTAN) 2 g in dextrose 5 % 50 mL IVPB     2 g 100 mL/hr over 30 Minutes Intravenous Every 12 hours 05/20/13 1808 05/21/13 0203   05/20/13 0551  cefoTEtan (CEFOTAN) 2 g in dextrose 5 % 50 mL IVPB     2 g 100 mL/hr over 30 Minutes Intravenous On call to O.R. 05/20/13 0551 05/20/13 1345      Assessment/Plan: s/p Procedure(s): LAPAROSCOPIC LOW ANTERIOR RESECTION WITH SPLENIC FLEXURE MOBILIZATION (N/A) diverting OSTOMY (N/A) CT last week showed persistent dilated loops of small bowel with transition point, decompressed small bowel distally.  Early post op bowel obstruction: possibly resolving, NG output more clear but still high.  Cont NG on LWS.  Will get SBFT today Renal labs worse agian today.  UOP about the same.  Slightly tachycardic.  IVF's per nephrology, appreciate assistance.  Monitor UOP closely.   Cont TPN due to inability to take PO, protein calorie malnutrition, rate decreased by pharmacy due to rising Cr  Cont ambulation, SCD's, Inc spirometry   LOS: 26 days    Margi Edmundson C. [id number]

## 2013-06-15 NOTE — Progress Notes (Signed)
Spoke with Joy in radiology.  They are now finished with upper GI radiology procedure and pt NGT can be returned to suction.  Pt denies nausea or increased abdominal discomfort while NGT clamped (since 1030 this AM).  Reconnected to LIWS, currently draining white/opaque liquid, presumed to be contrast from the procedure.  Will monitor.  Coolidge Breeze, RN 06/15/2013

## 2013-06-15 NOTE — Progress Notes (Signed)
Patient ID: TALYN DUFFNER, male   DOB: 10/21/1945, 67 y.o.   MRN: [id number]  [facility] Progress Note    Assessment/ Plan:   1. AKI on CKD3:  The initial injury appears to have been volume mediated and the more recent recurrent injury appears ATN. Renal function continues to worsen (probably hemodynamic with high NGT output) but no acute dialysis needs noted. Will start on Normal saline as a maintenance fluid. Continue to monitor closely-fortunately, he is non-oliguric. 2. Rectal/Colon cancer s/p LAR with diverting colostomy upper GI series done earlier today. 3. Anemia: Low hemoglobin status post surgery-appears to be stable, no indications for PRBCs at this point. NG tube suction appears to be blood tinged-possible overt loss.  4. Hypertension: Blood pressures elevated-he is essentially n.p.o. and I will restart carvedilol when felt safe by surgery. He has PRN metoprolol.   Subjective:   Reports to be feeling fair and denies any chest pain or shortness of breath. Per patient and staff around him, he is mentating well.    Objective:   BP 153/92  Pulse 116  Temp(Src) 98.3 F (36.8 C) (Oral)  Resp 18  Ht 5\' 7"  (1.702 m)  Wt 87.408 kg (192 lb 11.2 oz)  BMI 30.17 kg/m2  SpO2 100%  Intake/Output Summary (Last 24 hours) at 06/15/13 1352 Last data filed at 06/15/13 1320  Gross per 24 hour  Intake 2670.91 ml  Output   4901 ml  Net -2230.09 ml   Weight change: -3.901 kg (-8 lb 9.6 oz)  Physical Exam: [id number] resting in bed. Nasogastric tube in situ-currently capped. [id number] RRR, Normal S1 and S2-no audible rub Resp:CTA bilaterally, no rales/rhonchi [id number], slightly distended, intact ostomy site. Ext:No LE edema.   Imaging: No results found.  Labs: BMET  Recent Labs Lab 06/09/13 0434 06/10/13 0552 06/11/13 0550 06/11/13 1815 06/12/13 0422 06/13/13 0410 06/14/13 0625 06/15/13 0612  NA 135  134* 135 133* 131* 134* 136  135 133* 134*  K  3.4*  3.4* 3.3* 3.3* 4.0 3.9 4.6  4.6 5.5* 4.7  CL 100  100 99 97 96 98 97  96 96 92*  CO2 22  22 24 26 26 27 29  29 28 31   GLUCOSE 137*  136* 145* 138* 87 128* 129*  126* 119* 121*  BUN 38*  39* 41* 43* 45* 47* 57*  57* 68* 81*  CREATININE 2.71*  2.70* 2.89* 3.11* 3.27* 3.48* 4.46*  4.48* 5.68* 7.30*  CALCIUM 9.5  9.5 9.3 9.1 9.1 9.1 9.6  9.6 9.1 9.9  PHOS 3.8  3.7 4.6 4.3  --  4.7* 5.0*  4.9* 4.0 4.6   CBC  Recent Labs Lab 06/10/13 0552 06/13/13 0410  WBC 11.6* 9.6  NEUTROABS  --  7.8*  HGB 8.4* 8.6*  HCT 24.9* 26.1*  MCV 85.3 86.1  PLT 269 241    Medications:    . heparin subcutaneous  5,000 Units Subcutaneous Q8H  . insulin aspart  0-15 Units Subcutaneous Q4H  . sodium chloride  3 mL Intravenous Q12H      Elmarie Shiley, MD 06/15/2013, 1:52 PM

## 2013-06-15 NOTE — Progress Notes (Signed)
PARENTERAL NUTRITION CONSULT NOTE - FOLLOW UP  Pharmacy Consult for TNA Indication: Post-op Ileus  Allergies  Allergen Reactions  . Nsaids     Asked by surgeon to add this medication class as intolerance due to patients renal insufficiency.    Patient Measurements: Height: 5\' 7"  (170.2 cm) Weight: 192 lb 11.2 oz (87.408 kg) IBW/kg (Calculated) : 66.1 Adjusted Body Weight: 73kg Usual Weight: 104kg  Vital Signs: Temp: 98.1 F (36.7 C) (12/10 1007) Temp src: Oral (12/10 1007) BP: 149/84 mmHg (12/10 1007) Pulse Rate: 108 (12/10 1007)   Intake/Output from previous day: 12/09 0701 - 12/10 0700 In: 2670.9 [P.O.:60; I.V.:565; NG/GT:30; TPN:2015.9] Out: [id number] [Urine:701; Emesis/NG output:2200; Stool:950]  Labs:  Recent Labs  06/13/13 0410  WBC 9.6  HGB 8.6*  HCT 26.1*  PLT 241    Recent Labs  06/13/13 0410 06/14/13 0625 06/15/13 0612  NA 136  135 133* 134*  K 4.6  4.6 5.5* 4.7  CL 97  96 96 92*  CO2 29  29 28 31   GLUCOSE 129*  126* 119* 121*  BUN 57*  57* 68* 81*  CREATININE 4.46*  4.48* 5.68* 7.30*  CALCIUM 9.6  9.6 9.1 9.9  MG 1.8  --   --   PHOS 5.0*  4.9* 4.0 4.6  PROT 6.2  --   --   ALBUMIN 2.3*  2.3* 2.2* 2.5*  AST 10  --   --   ALT 8  --   --   ALKPHOS 130*  --   --   BILITOT 0.2*  --   --   PREALBUMIN 17.2*  --   --   TRIG 147  --   --     Recent Labs  06/15/13 0021 06/15/13 0417 06/15/13 0808  GLUCAP 107* 117* 118*   Insulin Requirements in the past 24 hours:   CGS 107-118, required 4 units SSI - moderate scale q4h  Nutritional Goals:   Per RD recs 12/8: 1800- 2000 kcal/day, 90-100 g/day protein (given renal injury)  Per RD recs 12/5: 1800-2000 Kcal/day, 100-110 g protein/day  Clinimix E 5/20 @ 40 ml/hr and IV Fat Emulsion 20% at 10 ml/hr will provide 48g protein, 1324 Kcal (pharmacy adjusting TNA rate starting 12/10 AM given elevated SCr, aiming for 0.6g/kg/day protein)  Current Nutrition:   Diet: back to NPO  12/9  Clinimix 5/15 at 50 ml/hr + 20% Lipids at 52ml/hr  IVF: Saline lock  Assessment: 36 yoM with rectal cancer s/p lap assisted LAR with coloanal anastomosis and diverting loop ileostomy, now with post-op ileus. TNA started 11/21 since surgery does not anticipate bowel function to return over next couple of days and patient has acute protein calorie malnutrition.   Glucose - CBGs at goal <150   Electrolytes - Na 134 (unable to adjust in TNA), K+ and Phos back to wnl   Renal function: CKD stage III, renal following, SCr continues to rise, today 7.3, CrCl 11.  Baseline Scr 1.8 to 2.1 per Renal. UOP 0.3 ml/kg/hr  LFTs - WNL. Alk phos elevated but improving.  TGs - was elevated but improved to 147 on 12/8. F/u labs 12/11   Prealbumin - improving.  9.8 (11/20), 11.3 (11/22), 14.2 (11/24), 18.1 (12/1), 17.2 (12/8)  12/10:  Scr trending up to 7.3 this AM. At 0800, TNA rate was adjusted to 50 ml/hr.  See above for new TNA goal. NG output 2200 cc, ileostomy output 950 cc. F/u renal plans.   TPN Access: Double lumen  PICC placed 11/20 TPN day#: 20  Plan:  Change Clinimix to E 5/20 @ 40 ml/hr given renal issues  TNA to contain standard multivitamins and trace elements.  Monitor electrolytes closely  Continue IV fat emulsion to 20% at 10 ml/hr. F/u TG in AM  Continue moderate scale SSI q4h  TNA lab panels on Mondays & Thursdays.  F/u renal plans  Vanessa Aurora, PharmD, BCPS Pager: [phone] 11:03 AM Pharmacy #: [id number]

## 2013-06-15 NOTE — Progress Notes (Signed)
Pt returned to room from radiology.  Radiology transporter informed staff that pt's upper GI test is not complete and his NGT should remain clamped until procedure is finished.  Will await further instructions and monitor pt.  Coolidge Breeze, RN 06/15/2013

## 2013-06-16 LAB — COMPREHENSIVE METABOLIC PANEL
ALT: 13 U/L (ref 0–53)
AST: 12 U/L (ref 0–37)
Albumin: 2.5 g/dL — ABNORMAL LOW (ref 3.5–5.2)
Alkaline Phosphatase: 166 U/L — ABNORMAL HIGH (ref 39–117)
BUN: 86 mg/dL — ABNORMAL HIGH (ref 6–23)
Chloride: 92 mEq/L — ABNORMAL LOW (ref 96–112)
GFR calc non Af Amer: 6 mL/min — ABNORMAL LOW (ref >90)
Potassium: 4.5 mEq/L (ref 3.5–5.1)
Sodium: 138 mEq/L (ref 135–145)
Total Bilirubin: 0.2 mg/dL — ABNORMAL LOW (ref 0.3–1.2)
Total Protein: 6.7 g/dL (ref 6.0–8.3)

## 2013-06-16 LAB — URINALYSIS, ROUTINE W REFLEX MICROSCOPIC
Bilirubin Urine: NEGATIVE
Ketones, ur: NEGATIVE mg/dL
Nitrite: NEGATIVE
Protein, ur: NEGATIVE mg/dL
Specific Gravity, Urine: 1.01 (ref 1.005–1.030)
Urobilinogen, UA: 0.2 mg/dL (ref 0.0–1.0)
pH: 8 (ref 5.0–8.0)

## 2013-06-16 LAB — GLUCOSE, CAPILLARY
Glucose-Capillary: 101 mg/dL — ABNORMAL HIGH (ref 70–99)
Glucose-Capillary: 113 mg/dL — ABNORMAL HIGH (ref 70–99)
Glucose-Capillary: 120 mg/dL — ABNORMAL HIGH (ref 70–99)
Glucose-Capillary: 120 mg/dL — ABNORMAL HIGH (ref 70–99)
Glucose-Capillary: 123 mg/dL — ABNORMAL HIGH (ref 70–99)

## 2013-06-16 LAB — RENAL FUNCTION PANEL
BUN: 86 mg/dL — ABNORMAL HIGH (ref 6–23)
CO2: 33 mEq/L — ABNORMAL HIGH (ref 19–32)
Chloride: 90 mEq/L — ABNORMAL LOW (ref 96–112)
Creatinine, Ser: 8.4 mg/dL — ABNORMAL HIGH (ref 0.50–1.35)
Glucose, Bld: 124 mg/dL — ABNORMAL HIGH (ref 70–99)
Potassium: 4.3 mEq/L (ref 3.5–5.1)

## 2013-06-16 LAB — URINE MICROSCOPIC-ADD ON

## 2013-06-16 LAB — MAGNESIUM: Magnesium: 1.7 mg/dL (ref 1.5–2.5)

## 2013-06-16 LAB — TRIGLYCERIDES: Triglycerides: 204 mg/dL — ABNORMAL HIGH (ref <150)

## 2013-06-16 MED ORDER — CARVEDILOL 3.125 MG PO TABS
3.1250 mg | ORAL_TABLET | Freq: Two times a day (BID) | ORAL | Status: DC
  Administered 2013-06-16 – 2013-06-21 (×11): 3.125 mg via ORAL
  Filled 2013-06-16 (×14): qty 1

## 2013-06-16 MED ORDER — FAT EMULSION 20 % IV EMUL
144.0000 mL | INTRAVENOUS | Status: AC
  Administered 2013-06-16: 144 mL via INTRAVENOUS
  Filled 2013-06-16: qty 200

## 2013-06-16 MED ORDER — TRACE MINERALS CR-CU-F-FE-I-MN-MO-SE-ZN IV SOLN
INTRAVENOUS | Status: AC
  Administered 2013-06-16: 18:00:00 via INTRAVENOUS
  Filled 2013-06-16: qty 1000

## 2013-06-16 NOTE — Progress Notes (Signed)
Patient ID: Jason [name], male   DOB: 21-Mar-1946, 67 y.o.   MRN: [id number]  [facility] Progress Note    Assessment/ Plan:   1. AKI on CKD3: The initial injury appears to have been volume mediated and the more recent recurrent injury appears ATN. Renal function continues to worsen but no acute dialysis needs noted. On normal saline for maintenance intravenous fluids (he has significant GI losses.) . There are no indications that this is a glomerulonephritis however, I will repeat urinalysis and urine electrolytes to look for indicators to pursue any serologies. His abdominal exam is rather benign and not supportive of intra-abdominal hypertension. Foley catheter in place, limited utility for repeating renal ultrasound.  2. Rectal/Colon cancer s/p LAR with diverting colostomy : Nasogastric tube is been discontinued by surgery-able to tolerate oral intake without abdominal pain. No evidence of SBO on upper GI series yesterday. 3. Anemia: Low hemoglobin status post surgery-appears to be stable, no indications for PRBCs at this point. 4. Hypertension: Blood pressure slightly elevated-started on carvedilol 3.125 mg twice a day   Subjective:   Reports to be feeling well-denies any shortness of breath when laying flat and states that he is thinking clearly.    Objective:   BP 151/86  Pulse 103  Temp(Src) 97.7 F (36.5 C) (Axillary)  Resp 18  Ht 5\' 7"  (1.702 m)  Wt 87.408 kg (192 lb 11.2 oz)  BMI 30.17 kg/m2  SpO2 99%  Intake/Output Summary (Last 24 hours) at 06/16/13 1213 Last data filed at 06/16/13 1045  Gross per 24 hour  Intake    480 ml  Output   2875 ml  Net  -2395 ml   Weight change:   Physical Exam: Gen: Comfortably laying flat in bed CVS: Pulse regular tachycardia, S1 and S2 with an ejection systolic murmur. No rubs Resp: Clear to auscultation bilaterally-no rales Abd: Soft, slightly distended, ostomy site intact Ext: No lower extremity edema appreciated. No  tremor.  Imaging: Dg Ugi W/small Bowel  06/15/2013   CLINICAL DATA:  Post-op small bowel obstruction  EXAM: UPPER GI SERIES WITH SMALL BOWEL FOLLOW-THROUGH  TECHNIQUE: Combined double contrast and single contrast upper GI series using effervescent crystals, thick barium, and thin barium. Subsequently, serial images of the small bowel were obtained including spot views of the terminal ileum.  COMPARISON:  None.  FLUOROSCOPY TIME:  dictate in minutes & seconds  FINDINGS: Scout frontal abdominal and pelvic radiograph demonstrates a normal bowel gas pattern. Fluoroscopy time 49 seconds.  Medium density barium was administered through the existing nasogastric tube. Medium density barium was periodically observed under fluoroscopy to travel from the stomach to the ascending colon (over a 110 minute time period). Contrast empties from the stomach rapidly. There is no evidence of small bowel stricture or obstruction. No large filling defects to suggest mass lesion. In addition, there is no evidence of tethering or definite inflammatory changes present within the small bowel. Contrast is seen within the ostomy bag.  IMPRESSION: No evidence of small bowel obstruction.   Electronically Signed   By: Kathreen Devoid   On: 06/15/2013 16:37    Labs: BMET  Recent Labs Lab 06/10/13 [id number] 06/11/13 0550 06/11/13 1815 06/12/13 0422 06/13/13 0410 06/14/13 0625 06/15/13 0612 06/16/13 0330  NA 135 133* 131* 134* 136  135 133* 134* 138  134*  K 3.3* 3.3* 4.0 3.9 4.6  4.6 5.5* 4.7 4.5  4.3  CL 99 97 96 98 97  96 96 92* 92*  90*  CO2 24 26 26 27 29  29 28 31  33*  33*  GLUCOSE 145* 138* 87 128* 129*  126* 119* 121* 125*  124*  BUN 41* 43* 45* 47* 57*  57* 68* 81* 86*  86*  CREATININE 2.89* 3.11* 3.27* 3.48* 4.46*  4.48* 5.68* 7.30* 8.40*  8.40*  CALCIUM 9.3 9.1 9.1 9.1 9.6  9.6 9.1 9.9 9.9  9.6  PHOS 4.6 4.3  --  4.7* 5.0*  4.9* 4.0 4.6 6.3*  6.2*   CBC  Recent Labs Lab 06/10/13 0552  06/13/13 0410  WBC 11.6* 9.6  NEUTROABS  --  7.8*  HGB 8.4* 8.6*  HCT 24.9* 26.1*  MCV 85.3 86.1  PLT 269 241    Medications:    . carvedilol  3.125 mg Oral BID WC  . heparin subcutaneous  5,000 Units Subcutaneous Q8H  . insulin aspart  0-15 Units Subcutaneous Q4H  . sodium chloride  3 mL Intravenous Q12H   Elmarie Shiley, MD 06/16/2013, 12:13 PM

## 2013-06-16 NOTE — Progress Notes (Signed)
27 Days Post-Op  Subjective: Feeling ok, NG still with high output.    Objective: Vital signs in last 24 hours: Temp:  [97.6 F (36.4 C)-98.6 F (37 C)] 97.7 F (36.5 C) (12/11 0518) Pulse Rate:  [99-116] 103 (12/11 0518) Resp:  [16-18] 18 (12/11 0518) BP: (149-160)/(78-92) 151/86 mmHg (12/11 0518) SpO2:  [99 %-100 %] 99 % (12/11 0518) Last BM Date: 06/15/13  Intake/Output from previous day: 12/10 0701 - 12/11 0700 In: 0  Out: 3150 [Urine:725; Emesis/NG output:800; Stool:1625] Intake/Output this shift:   General appearance: alert, cooperative and no distress Resp: nonlabored GI: soft, NT, no distention, no peritoneal signs.  ostomy with thin green output. NG with brownish tinged liquid return  Lab Results:  No results found for this basename: WBC, HGB, HCT, PLT,  in the last 72 hours BMET  Recent Labs  06/15/13 0612 06/16/13 0330  NA 134* 138  134*  K 4.7 4.5  4.3  CL 92* 92*  90*  CO2 31 33*  33*  GLUCOSE 121* 125*  124*  BUN 81* 86*  86*  CREATININE 7.30* 8.40*  8.40*  CALCIUM 9.9 9.9  9.6   PT/INR No results found for this basename: LABPROT, INR,  in the last 72 hours ABG No results found for this basename: PHART, PCO2, PO2, HCO3,  in the last 72 hours  Studies/Results: Dg Ugi W/small Bowel  06/15/2013   CLINICAL DATA:  Post-op small bowel obstruction  EXAM: UPPER GI SERIES WITH SMALL BOWEL FOLLOW-THROUGH  TECHNIQUE: Combined double contrast and single contrast upper GI series using effervescent crystals, thick barium, and thin barium. Subsequently, serial images of the small bowel were obtained including spot views of the terminal ileum.  COMPARISON:  None.  FLUOROSCOPY TIME:  dictate in minutes & seconds  FINDINGS: Scout frontal abdominal and pelvic radiograph demonstrates a normal bowel gas pattern. Fluoroscopy time 49 seconds.  Medium density barium was administered through the existing nasogastric tube. Medium density barium was periodically  observed under fluoroscopy to travel from the stomach to the ascending colon (over a 110 minute time period). Contrast empties from the stomach rapidly. There is no evidence of small bowel stricture or obstruction. No large filling defects to suggest mass lesion. In addition, there is no evidence of tethering or definite inflammatory changes present within the small bowel. Contrast is seen within the ostomy bag.  IMPRESSION: No evidence of small bowel obstruction.   Electronically Signed   By: Kathreen Devoid   On: 06/15/2013 16:37    Anti-infectives: Anti-infectives   Start     Dose/Rate Route Frequency Ordered Stop   06/05/13 1400  piperacillin-tazobactam (ZOSYN) IVPB 3.375 g  Status:  Discontinued     3.375 g 12.5 mL/hr over 240 Minutes Intravenous 3 times per day 06/05/13 1159 06/10/13 0815   06/01/13 1600  piperacillin-tazobactam (ZOSYN) IVPB 2.25 g  Status:  Discontinued     2.25 g 100 mL/hr over 30 Minutes Intravenous 4 times per day 06/01/13 1024 06/05/13 1159   05/31/13 1300  piperacillin-tazobactam (ZOSYN) IVPB 3.375 g  Status:  Discontinued     3.375 g 12.5 mL/hr over 240 Minutes Intravenous 3 times per day 05/31/13 1212 06/01/13 1025   05/21/13 0200  cefoTEtan (CEFOTAN) 2 g in dextrose 5 % 50 mL IVPB     2 g 100 mL/hr over 30 Minutes Intravenous Every 12 hours 05/20/13 1808 05/21/13 0203   05/20/13 0551  cefoTEtan (CEFOTAN) 2 g in dextrose 5 % 50 mL  IVPB     2 g 100 mL/hr over 30 Minutes Intravenous On call to O.R. 05/20/13 0551 05/20/13 1345      Assessment/Plan: s/p Procedure(s): LAPAROSCOPIC LOW ANTERIOR RESECTION WITH SPLENIC FLEXURE MOBILIZATION (N/A) diverting OSTOMY (N/A) CT last week showed persistent dilated loops of small bowel with transition point, decompressed small bowel distally.  Early post op bowel obstruction: SBFT shows resolution. NG output clear.  D/C NG and start clears.  Restart PO meds Renal labs worse agian today.  UOP about the same.  Slightly  tachycardic.  IVF's per nephrology, appreciate assistance.  Monitor UOP closely.   Cont TPN due to inability to take PO, protein calorie malnutrition, rate decreased by pharmacy due to rising Cr  Cont ambulation, SCD's, Inc spirometry   LOS: 27 days    Jason Correia C. [id number]

## 2013-06-16 NOTE — Progress Notes (Addendum)
PARENTERAL NUTRITION CONSULT NOTE - FOLLOW UP  Pharmacy Consult for TNA Indication: Post-op Ileus  Allergies  Allergen Reactions  . Nsaids     Asked by surgeon to add this medication class as intolerance due to patients renal insufficiency.    Patient Measurements: Height: 5\' 7"  (170.2 cm) Weight: 192 lb 11.2 oz (87.408 kg) IBW/kg (Calculated) : 66.1 Adjusted Body Weight: 73kg Usual Weight: 104kg  Vital Signs: Temp: 97.7 F (36.5 C) (12/11 0518) Temp src: Axillary (12/11 0518) BP: 151/86 mmHg (12/11 0518) Pulse Rate: 103 (12/11 0518)   Intake/Output from previous day: 12/10 0701 - 12/11 0700 In: 0  Out: 3150 [Urine:725; Emesis/NG output:800; Stool:1625]  Labs: No results found for this basename: WBC, HGB, HCT, PLT, APTT, INR,  in the last 72 hours  Recent Labs  06/14/13 0625 06/15/13 0612 06/16/13 0330  NA 133* 134* 138  134*  K 5.5* 4.7 4.5  4.3  CL 96 92* 92*  90*  CO2 28 31 33*  33*  GLUCOSE 119* 121* 125*  124*  BUN 68* 81* 86*  86*  CREATININE 5.68* 7.30* 8.40*  8.40*  CALCIUM 9.1 9.9 9.9  9.6  MG  --   --  1.7  PHOS 4.0 4.6 6.3*  6.2*  PROT  --   --  6.7  ALBUMIN 2.2* 2.5* 2.5*  2.5*  AST  --   --  12  ALT  --   --  13  ALKPHOS  --   --  166*  BILITOT  --   --  0.2*    Recent Labs  06/15/13 1711 06/15/13 1940 06/16/13 0008  GLUCAP 110* 119* 120*   Insulin Requirements in the past 24 hours:   CGS 110-120, required 2 units SSI - moderate scale q4h  Nutritional Goals:   Per RD recs 12/8: 1800- 2000 kcal/day, 90-100 g/day protein (given renal injury)  Per RD recs 12/5: 1800-2000 Kcal/day, 100-110 g protein/day  Goal adjustment (12/11):  Clinimix 5/15 (electrolyte free) @ 35 ml/hr and IV Fat Emulsion 20% at 6 ml/hr will provide 42g protein, 884 Kcal (pharmacy adjusting TNA rate starting 12/10 AM given AKI, aiming for ~0.6g/kg/day protein using IBW for protein calculations)  Current Nutrition:   Diet: CLD, starting  12/11  Clinimix E 5/20 at 40 ml/hr + 20% Lipids at 33ml/hr  IVF: per nephrology, currently NS at 75 ml/hr  Assessment: 4 yoM with rectal cancer s/p lap assisted LAR with coloanal anastomosis and diverting loop ileostomy, now with post-op ileus. TNA started 11/21 since surgery does not anticipate bowel function to return over next couple of days and patient has acute protein calorie malnutrition.   Glucose - CBGs at goal <150   Electrolytes - Phos elevated after adding electrolytes back into TNA.  Corrected Ca 11.1, giving a [Ca][Phos] product of 69.  Renal function: AKI on CKD3. SCr continues to rise, today 8.4, CrCl 9.1.  Baseline Scr 1.8 to 2.1 per Renal. UOP 0.8 ml/kg/hr  LFTs - WNL. Alk phos elevated  TGs - 204 (12/10), 147 (12/8)  Prealbumin - improving.  9.8 (11/20), 11.3 (11/22), 14.2 (11/24), 18.1 (12/1), 17.2 (12/8)  12/11:  Scr continuing to trending up.  Per Nephrology, this appears to be ATN.  Restricting protein provided by TNA while patient in AKI.  NG output 800 cc, ileostomy output 1625 cc. Pre-albumin improved.  TPN Access: Double lumen PICC placed 11/20 TPN day#: 21  Plan:  Change TNA to electrolyte free formulation.  Switch to  Clinimix 5/15 at 35 ml/hr.  Restricting protein for acute renal insufficiency.  TNA to contain standard multivitamins and trace elements.  Monitor electrolytes closely  Reduce IV fat emulsion 20% to 6 ml/hr so lipids do not represent more than 30% of parenteral nutrition.  Continue moderate scale SSI q4h  TNA lab panels on Mondays & Thursdays.  Renal function panel ordered daily.  F/u daily.  Hershal Coria, PharmD, BCPS Pager: [phone] 06/16/2013 7:36 AM

## 2013-06-17 LAB — RENAL FUNCTION PANEL
Albumin: 2.4 g/dL — ABNORMAL LOW (ref 3.5–5.2)
BUN: 85 mg/dL — ABNORMAL HIGH (ref 6–23)
Calcium: 9.1 mg/dL (ref 8.4–10.5)
Creatinine, Ser: 9 mg/dL — ABNORMAL HIGH (ref 0.50–1.35)
GFR calc Af Amer: 6 mL/min — ABNORMAL LOW (ref >90)
Glucose, Bld: 97 mg/dL (ref 70–99)
Phosphorus: 6.3 mg/dL — ABNORMAL HIGH (ref 2.3–4.6)
Potassium: 4.2 mEq/L (ref 3.5–5.1)
Sodium: 134 mEq/L — ABNORMAL LOW (ref 135–145)

## 2013-06-17 LAB — GLUCOSE, CAPILLARY
Glucose-Capillary: 109 mg/dL — ABNORMAL HIGH (ref 70–99)
Glucose-Capillary: 130 mg/dL — ABNORMAL HIGH (ref 70–99)
Glucose-Capillary: 101 mg/dL — ABNORMAL HIGH (ref 70–99)
Glucose-Capillary: 98 mg/dL (ref 70–99)

## 2013-06-17 LAB — C4 COMPLEMENT: Complement C4, Body Fluid: 36 mg/dL (ref 10–40)

## 2013-06-17 LAB — CREATININE, URINE, RANDOM: Creatinine, Urine: 98.6 mg/dL

## 2013-06-17 MED ORDER — FAT EMULSION 20 % IV EMUL
144.0000 mL | INTRAVENOUS | Status: AC
  Administered 2013-06-17: 144 mL via INTRAVENOUS
  Filled 2013-06-17: qty 200

## 2013-06-17 MED ORDER — TRACE MINERALS CR-CU-F-FE-I-MN-MO-SE-ZN IV SOLN
INTRAVENOUS | Status: AC
  Administered 2013-06-17: 17:00:00 via INTRAVENOUS
  Filled 2013-06-17: qty 1000

## 2013-06-17 NOTE — Progress Notes (Signed)
28 Days Post-Op  Subjective: Feeling fine.  Tolerated clears yesterday    Objective: Vital signs in last 24 hours: Temp:  [98.1 F (36.7 C)-98.3 F (36.8 C)] 98.3 F (36.8 C) (12/12 0500) Pulse Rate:  [79-100] 79 (12/12 0500) Resp:  [16-17] 17 (12/12 0500) BP: (135-143)/(79-85) 135/85 mmHg (12/12 0500) SpO2:  [100 %] 100 % (12/12 0500) Weight:  [191 lb 9.7 oz (86.912 kg)-192 lb 1.6 oz (87.136 kg)] 191 lb 9.7 oz (86.912 kg) (12/12 0500) Last BM Date: 06/17/13  Intake/Output from previous day: 12/11 0701 - 12/12 0700 In: 2112 [P.O.:720; I.V.:900; TPN:492] Out: 2975 [Urine:1075; Emesis/NG output:150; [id number] Intake/Output this shift: Total I/O In: -  Out: 100 [Stool:100] General appearance: alert, cooperative and no distress Resp: nonlabored GI: soft, NT, no distention, no peritoneal signs.  ostomy with thin green output. NG with brownish tinged liquid return  Lab Results:  No results found for this basename: WBC, HGB, HCT, PLT,  in the last 72 hours BMET  Recent Labs  06/16/13 0330 06/17/13 0345  NA 138  134* 134*  K 4.5  4.3 4.2  CL 92*  90* 90*  CO2 33*  33* 31  GLUCOSE 125*  124* 97  BUN 86*  86* 85*  CREATININE 8.40*  8.40* 9.00*  CALCIUM 9.9  9.6 9.1   PT/INR No results found for this basename: LABPROT, INR,  in the last 72 hours ABG No results found for this basename: PHART, PCO2, PO2, HCO3,  in the last 72 hours  Studies/Results: Dg Ugi W/small Bowel  06/15/2013   CLINICAL DATA:  Post-op small bowel obstruction  EXAM: UPPER GI SERIES WITH SMALL BOWEL FOLLOW-THROUGH  TECHNIQUE: Combined double contrast and single contrast upper GI series using effervescent crystals, thick barium, and thin barium. Subsequently, serial images of the small bowel were obtained including spot views of the terminal ileum.  COMPARISON:  None.  FLUOROSCOPY TIME:  dictate in minutes & seconds  FINDINGS: Scout frontal abdominal and pelvic radiograph demonstrates a normal  bowel gas pattern. Fluoroscopy time 49 seconds.  Medium density barium was administered through the existing nasogastric tube. Medium density barium was periodically observed under fluoroscopy to travel from the stomach to the ascending colon (over a 110 minute time period). Contrast empties from the stomach rapidly. There is no evidence of small bowel stricture or obstruction. No large filling defects to suggest mass lesion. In addition, there is no evidence of tethering or definite inflammatory changes present within the small bowel. Contrast is seen within the ostomy bag.  IMPRESSION: No evidence of small bowel obstruction.   Electronically Signed   By: Kathreen Devoid   On: 06/15/2013 16:37    Anti-infectives: Anti-infectives   Start     Dose/Rate Route Frequency Ordered Stop   06/05/13 1400  piperacillin-tazobactam (ZOSYN) IVPB 3.375 g  Status:  Discontinued     3.375 g 12.5 mL/hr over 240 Minutes Intravenous 3 times per day 06/05/13 1159 06/10/13 0815   06/01/13 1600  piperacillin-tazobactam (ZOSYN) IVPB 2.25 g  Status:  Discontinued     2.25 g 100 mL/hr over 30 Minutes Intravenous 4 times per day 06/01/13 1024 06/05/13 1159   05/31/13 1300  piperacillin-tazobactam (ZOSYN) IVPB 3.375 g  Status:  Discontinued     3.375 g 12.5 mL/hr over 240 Minutes Intravenous 3 times per day 05/31/13 1212 06/01/13 1025   05/21/13 0200  cefoTEtan (CEFOTAN) 2 g in dextrose 5 % 50 mL IVPB     2 g 100  mL/hr over 30 Minutes Intravenous Every 12 hours 05/20/13 1808 05/21/13 0203   05/20/13 0551  cefoTEtan (CEFOTAN) 2 g in dextrose 5 % 50 mL IVPB     2 g 100 mL/hr over 30 Minutes Intravenous On call to O.R. 05/20/13 0551 05/20/13 1345      Assessment/Plan: s/p Procedure(s): LAPAROSCOPIC LOW ANTERIOR RESECTION WITH SPLENIC FLEXURE MOBILIZATION (N/A) diverting OSTOMY (N/A) CT last week showed persistent dilated loops of small bowel with transition point and decompressed small bowel distally.  Early post op  bowel obstruction: SBFT shows resolution. Tolerated clears yesterday with appropriate increase in ileostomy output and no nausea.  Advance to soft diet today.   ARF: Renal labs slightly worse agian today.  Etiology unclear.  UOP about the same.  No SOB, no hyperkalemia, mentating well.  IVF's per nephrology, appreciate assistance.  Monitor UOP closely.  Cont foley per Nephrology.  Appreciate their assistance with management of this difficult situation.   Less tachycardic now that he is back on carvedilol.  Cont TPN due to inability to take PO, protein calorie malnutrition.  If continues to tolerate diet, may be able to begin to wean to off.   Cont ambulation, SCD's, Inc spirometry   LOS: 28 days    Tonimarie Gritz C. [id number]

## 2013-06-17 NOTE — Progress Notes (Signed)
NUTRITION FOLLOW UP  Intervention:   - TPN wean per pharmacy - RD to continue to monitor   Nutrition Dx:   Inadequate oral intake related to medical condition as evidenced by NPO status; ongoing    Goal:   Pt to meet >/= 90% of their estimated nutrition needs; not being met  Monitor:  TPN rate, Weight, Labs, Diet advancement   Assessment:   67 year old male with rectal cancer s/ neoadjuvant chemoRt with history of HTN, Gout, diverticulosis, chronic renal insufficiency, and anemia. Pt now 7 days s/p lap assisted LAR with coloanal anastomosis and diverting loop ileostomy. Pt with post-op ileus; PICC placed 11/20 and plan to start pt on TPN tonight. Pt has been NPO since 11/14 except for one day of clear liquids on 11/17. Pt being brought to CT scan at time of visit. Per MST report, pt denied recent weight loss and decreased appetite PTA.   11/28 - S/p NGT placed 11/22 with 2L output and total output that day of 5.8L. NGT out 11/25. Nephrology following. TPN cut in half yesterday. Low fiber diet started 11/27. Met with pt who reports eating 100% of meals today including some yogurt for breakfast and some beef and soup for lunch. Requesting a snack which was ordered. Interested in getting Ensure Complete.   11/29 - Had nausea and large amount of emesis. Changed to NPO.   11/30 - Pt c/o abdominal fullness. No reports of nausea. Pt refused NGT.   12/1 - Surgeon prefers to wait until pt's abdominal distention resolved before advancing diet. Plan is to continue TPN.   12/5 - NG tube in place but, unhooked from suction at time of visit. Per MD note pt having multiple liters of ileostomy and NG output.  TPN continues: Clinimix E5/15 at 83 ml/hr + 20% Lipids at 2ml/hr which provides 1798 kcal and 100 grams of protein. This meets 100% of estimated energy needs and 100% of estimated protein needs. Lipids continue at reduced rate due to elevated triglycerides  12/8 Per chart acute on CKD due to  volume depletion. Per MD note pt still having multiple liters of NG and ostomy output; bowel obstruction possibly resolving- will clamp tube and measure residuals today. Reduced protein needs slightly for now until kidney function returns to baseline.   12/12 NG tube removed, diet advanced and TPN at decreased rate. Currently Clinamix E 5/15 is running at 35 ml/hr with lipids at 6 ml/hr providing 884 kcal and 42 grams protein. Pt's diet advanced to Dysphagia 3/Renal this morning; pt about to order lunch at time of visit. Briefly discussed food restrictions on renal diet with pt. Pt reports his appetite is good; he denies any nausea or pain. Pt has lost 8 lbs since admission (4% weight loss).   - Triglycerides elevated again (204 mg/dL on 12/11) - High Phosphorus - BUN/Cr elevated and trending up - CBGs < 140mg /dL today - Low albumin  CBG (last 3)   Recent Labs  06/17/13 0416 06/17/13 0728 06/17/13 1120  GLUCAP 95 109* 130*     Height: Ht Readings from Last 1 Encounters:  05/20/13 5\' 7"  (1.702 m)    Weight Status:   Wt Readings from Last 1 Encounters:  06/17/13 191 lb 9.7 oz (86.912 kg)  Admit wt:        199 lb (90.26 kg)   Re-estimated needs:  Kcal: 1800- 2000  Protein: 52-70 grams  Fluid: 2.3-2.5 L/day   Skin: Rectal and abdominal incision, facial edema  Diet Order: Dysphagia   Intake/Output Summary (Last 24 hours) at 06/17/13 1140 Last data filed at 06/17/13 1004  Gross per 24 hour  Intake   2052 ml  Output   2840 ml  Net   -788 ml    Last BM: 12/12 from ileostomy   Labs:   Recent Labs Lab 06/13/13 0410  06/15/13 0612 06/16/13 0330 06/17/13 0345  NA 136  135  < > 134* 138  134* 134*  K 4.6  4.6  < > 4.7 4.5  4.3 4.2  CL 97  96  < > 92* 92*  90* 90*  CO2 29  29  < > 31 33*  33* 31  BUN 57*  57*  < > 81* 86*  86* 85*  CREATININE 4.46*  4.48*  < > 7.30* 8.40*  8.40* 9.00*  CALCIUM 9.6  9.6  < > 9.9 9.9  9.6 9.1  MG 1.8  --   --  1.7   --   PHOS 5.0*  4.9*  < > 4.6 6.3*  6.2* 6.3*  GLUCOSE 129*  126*  < > 121* 125*  124* 97  < > = values in this interval not displayed.  CBG (last 3)   Recent Labs  06/17/13 0416 06/17/13 0728 06/17/13 1120  GLUCAP 95 109* 130*    Scheduled Meds: . carvedilol  3.125 mg Oral BID WC  . heparin subcutaneous  5,000 Units Subcutaneous Q8H  . insulin aspart  0-15 Units Subcutaneous Q4H  . sodium chloride  3 mL Intravenous Q12H    Continuous Infusions: . sodium chloride 75 mL/hr at 06/16/13 2247  . TPN (CLINIMIX) Adult without lytes 35 mL/hr at 06/16/13 1753   And  . fat emulsion 144 mL (06/16/13 1753)  . TPN (CLINIMIX) Adult without lytes     And  . fat emulsion      Pryor Ochoa RD, LDN Inpatient Clinical Dietitian Pager: [phone] After Hours Pager: [phone]

## 2013-06-17 NOTE — Progress Notes (Signed)
PARENTERAL NUTRITION CONSULT NOTE - FOLLOW UP  Pharmacy Consult for TNA Indication: Post-op Ileus  Allergies  Allergen Reactions  . Nsaids     Asked by surgeon to add this medication class as intolerance due to patients renal insufficiency.    Patient Measurements: Height: 5\' 7"  (170.2 cm) Weight: 191 lb 9.7 oz (86.912 kg) IBW/kg (Calculated) : 66.1 Adjusted Body Weight: 73kg Usual Weight: 104kg  Vital Signs: Temp: 98.3 F (36.8 C) (12/12 0500) Temp src: Oral (12/12 0500) BP: 135/85 mmHg (12/12 0500) Pulse Rate: 79 (12/12 0500)   Intake/Output from previous day: 12/11 0701 - 12/12 0700 In: 2112 [P.O.:720; I.V.:900; TPN:492] Out: 2975 [id number]; Emesis/NG output:150; [id number]  Labs: No results found for this basename: WBC, HGB, HCT, PLT, APTT, INR,  in the last 72 hours  Recent Labs  06/15/13 0612 06/16/13 0330 06/16/13 1443 06/17/13 0345  NA 134* 138  134*  --  134*  K 4.7 4.5  4.3  --  4.2  CL 92* 92*  90*  --  90*  CO2 31 33*  33*  --  31  GLUCOSE 121* 125*  124*  --  97  BUN 81* 86*  86*  --  85*  CREATININE 7.30* 8.40*  8.40*  --  9.00*  LABCREA  --   --  98.6  --   CALCIUM 9.9 9.9  9.6  --  9.1  MG  --  1.7  --   --   PHOS 4.6 6.3*  6.2*  --  6.3*  PROT  --  6.7  --   --   ALBUMIN 2.5* 2.5*  2.5*  --  2.4*  AST  --  12  --   --   ALT  --  13  --   --   ALKPHOS  --  166*  --   --   BILITOT  --  0.2*  --   --   TRIG  --  204*  --   --     Recent Labs  06/16/13 2357 06/17/13 0416 06/17/13 0728  GLUCAP 114* 95 109*   Insulin Requirements in the past 24 hours:   CGS 95-114, required 4 units SSI - moderate scale q4h  Nutritional Goals:   Per RD recs 12/8: 1800- 2000 kcal/day, 90-100 g/day protein (given renal injury)  Per RD recs 12/5: 1800-2000 Kcal/day, 100-110 g protein/day  Goal adjustment (12/11):  Clinimix 5/15 (electrolyte free) @ 35 ml/hr and IV Fat Emulsion 20% at 6 ml/hr will provide 42g protein, 884 Kcal (pharmacy  adjusting TNA rate starting 12/10 AM given AKI, aiming for ~0.6g/kg/day protein using IBW for protein calculations)  Current Nutrition:   Diet: CLD, starting 12/11  Clinimix 5/15 at 35 ml/hr + 20% Lipids at 62ml/hr  IVF: per nephrology, currently NS at 75 ml/hr  Assessment: 38 yoM with rectal cancer s/p lap assisted LAR with coloanal anastomosis and diverting loop ileostomy, now with post-op ileus. TNA started 11/21 since surgery does not anticipate bowel function to return over next couple of days and patient has acute protein calorie malnutrition.   Glucose - CBGs at goal <150   Electrolytes - Phos elevated after adding electrolytes back into TNA 12/10.  Electrolytes removed from TNA 12/11.  Corrected Ca 10.4 (decreased slightly today), giving a [Ca][Phos] product of 65.  Na 134.  K 4.2  Renal function: AKI on CKD3. SCr continues to rise, today 9.0, CrCl ~8.5.  Baseline Scr 1.8 to 2.1 per Renal. UOP  0.5 ml/kg/hr  LFTs - WNL. Alk phos elevated  TGs - 147 (12/8), 204 (12/10)  Prealbumin - improving.  9.8 (11/20), 11.3 (11/22), 14.2 (11/24), 18.1 (12/1), 17.2 (12/8)  12/12:  Scr continuing to trending up.  Per Nephrology, this appears to be ATN.  Restricting protein provided by TNA while patient shows AKI.  NG output 150 cc, ileostomy output 1750 cc. Pre-albumin improved.  TPN Access: Double lumen PICC placed 11/20 TPN day#: 21  Plan:  Continue Clinimix (electrolyte-free) 5/15 at 35 ml/hr.  Restricting protein for acute renal insufficiency.    TNA to contain standard multivitamins and trace elements.  Monitor electrolytes closely  Continue IV fat emulsion 20% at reduced rate of 6 ml/hr so lipids do not represent more than 30% of parenteral nutrition.    Continue moderate scale SSI q4h  TNA lab panels on Mondays & Thursdays.  Renal function panel ordered daily.  F/u daily.  Hershal Coria, PharmD, BCPS Pager: [phone] 06/17/2013 7:48 AM

## 2013-06-17 NOTE — Progress Notes (Signed)
Patient ID: Jason [name], male   DOB: [birth year], 67 y.o.   MRN: [id number]   [facility] Progress Note    Assessment/ Plan:   1. AKI on CKD3: The initial injury appears to have been volume mediated and the more recent recurrent injury appears ATN. UA repeated yesterday continues to corroborate clinical suspicion and urine electrolytes indicate euvolemia. With slower rate of creatinine rise, I suspect that he is approaching the ATN plateau phase soon with recovery to follow. He remains without features of volume overload/respiratory compromise and does not have any uremic symptoms. No dialysis needs. Attempted to match input closely to output. 2. Rectal/Colon cancer s/p LAR with diverting colostomy : Doing better without his NG tube. Tolerated clears yesterday and has been advanced to soft diet today. 3. Anemia: Low hemoglobin status post surgery-appears to be stable, no indications for PRBCs at this point.  4. Hypertension: Blood pressure slightly elevated-on carvedilol 3.125 mg twice a day-will transiently tolerate higher blood pressures instead of aggressive of pressure control with its hemodynamic impact on renal recovery.  Subjective:   Reports to be comfortable-abdomen without pain. Denies any nausea or vomiting.    Objective:   BP 135/85  Pulse 79  Temp(Src) 98.3 F (36.8 C) (Oral)  Resp 17  Ht 5\' 7"  (1.702 m)  Wt 86.912 kg (191 lb 9.7 oz)  BMI 30.00 kg/m2  SpO2 100%  Intake/Output Summary (Last 24 hours) at 06/17/13 1247 Last data filed at 06/17/13 1004  Gross per 24 hour  Intake   2052 ml  Output   2615 ml  Net   -563 ml   Weight change:   Physical Exam: Gen: Comfortably resting in bed-nurse by bedside CVS: Pulse regular in rate and rhythm, S1 and S2 with ESM Resp: Clear to auscultation bilaterally-no rales Abd: Soft, obese, right lower quadrant ostomy with thin greenish output Ext: No lower extremity edema  Imaging: Dg Ugi W/small Bowel  06/15/2013    CLINICAL DATA:  Post-op small bowel obstruction  EXAM: UPPER GI SERIES WITH SMALL BOWEL FOLLOW-THROUGH  TECHNIQUE: Combined double contrast and single contrast upper GI series using effervescent crystals, thick barium, and thin barium. Subsequently, serial images of the small bowel were obtained including spot views of the terminal ileum.  COMPARISON:  None.  FLUOROSCOPY TIME:  dictate in minutes & seconds  FINDINGS: Scout frontal abdominal and pelvic radiograph demonstrates a normal bowel gas pattern. Fluoroscopy time 49 seconds.  Medium density barium was administered through the existing nasogastric tube. Medium density barium was periodically observed under fluoroscopy to travel from the stomach to the ascending colon (over a 110 minute time period). Contrast empties from the stomach rapidly. There is no evidence of small bowel stricture or obstruction. No large filling defects to suggest mass lesion. In addition, there is no evidence of tethering or definite inflammatory changes present within the small bowel. Contrast is seen within the ostomy bag.  IMPRESSION: No evidence of small bowel obstruction.   Electronically Signed   By: Kathreen Devoid   On: 06/15/2013 16:37    Labs: BMET  Recent Labs Lab 06/11/13 0550 06/11/13 1815 06/12/13 0422 06/13/13 0410 06/14/13 0625 06/15/13 0612 06/16/13 0330 06/17/13 0345  NA 133* 131* 134* 136  135 133* 134* 138  134* 134*  K 3.3* 4.0 3.9 4.6  4.6 5.5* 4.7 4.5  4.3 4.2  CL 97 96 98 97  96 96 92* 92*  90* 90*  CO2 26 26 27  29  29 28 31  33*  33* 31  GLUCOSE 138* 87 128* 129*  126* 119* 121* 125*  124* 97  BUN 43* 45* 47* 57*  57* 68* 81* 86*  86* 85*  CREATININE 3.11* 3.27* 3.48* 4.46*  4.48* 5.68* 7.30* 8.40*  8.40* 9.00*  CALCIUM 9.1 9.1 9.1 9.6  9.6 9.1 9.9 9.9  9.6 9.1  PHOS 4.3  --  4.7* 5.0*  4.9* 4.0 4.6 6.3*  6.2* 6.3*   CBC  Recent Labs Lab 06/13/13 0410  WBC 9.6  NEUTROABS 7.8*  HGB 8.6*  HCT 26.1*  MCV 86.1  PLT  241    Medications:    . carvedilol  3.125 mg Oral BID WC  . heparin subcutaneous  5,000 Units Subcutaneous Q8H  . insulin aspart  0-15 Units Subcutaneous Q4H  . sodium chloride  3 mL Intravenous Q12H   Elmarie Shiley, MD 06/17/2013, 12:47 PM

## 2013-06-18 LAB — GLUCOSE, CAPILLARY
Glucose-Capillary: 94 mg/dL (ref 70–99)
Glucose-Capillary: 109 mg/dL — ABNORMAL HIGH (ref 70–99)
Glucose-Capillary: 98 mg/dL (ref 70–99)
Glucose-Capillary: 92 mg/dL (ref 70–99)

## 2013-06-18 LAB — RENAL FUNCTION PANEL
Albumin: 2.4 g/dL — ABNORMAL LOW (ref 3.5–5.2)
BUN: 81 mg/dL — ABNORMAL HIGH (ref 6–23)
CO2: 26 mEq/L (ref 19–32)
Chloride: 92 mEq/L — ABNORMAL LOW (ref 96–112)
GFR calc Af Amer: 6 mL/min — ABNORMAL LOW (ref >90)
GFR calc non Af Amer: 5 mL/min — ABNORMAL LOW (ref >90)
Glucose, Bld: 99 mg/dL (ref 70–99)
Potassium: 4 mEq/L (ref 3.5–5.1)
Sodium: 132 mEq/L — ABNORMAL LOW (ref 135–145)

## 2013-06-18 MED ORDER — TRACE MINERALS CR-CU-F-FE-I-MN-MO-SE-ZN IV SOLN
INTRAVENOUS | Status: AC
  Administered 2013-06-18: 18:00:00 via INTRAVENOUS
  Filled 2013-06-18: qty 1000

## 2013-06-18 MED ORDER — FAT EMULSION 20 % IV EMUL
144.0000 mL | INTRAVENOUS | Status: AC
  Administered 2013-06-18: 144 mL via INTRAVENOUS
  Filled 2013-06-18: qty 200

## 2013-06-18 MED ORDER — FERROUS SULFATE 325 (65 FE) MG PO TABS
325.0000 mg | ORAL_TABLET | Freq: Two times a day (BID) | ORAL | Status: DC
  Administered 2013-06-18 – 2013-06-19 (×3): 325 mg via ORAL
  Filled 2013-06-18 (×5): qty 1

## 2013-06-18 MED ORDER — PSYLLIUM 95 % PO PACK
1.0000 | PACK | Freq: Two times a day (BID) | ORAL | Status: DC
  Administered 2013-06-18 – 2013-06-20 (×4): 1 via ORAL
  Administered 2013-06-20 – 2013-06-21 (×2): via ORAL
  Administered 2013-06-21: 1 via ORAL
  Filled 2013-06-18 (×11): qty 1

## 2013-06-18 NOTE — Progress Notes (Signed)
29 Days Post-Op  Subjective: Feeling fine.  Tolerated soft diet yesterday    Objective: Vital signs in last 24 hours: Temp:  [98 F (36.7 C)-98.8 F (37.1 C)] 98 F (36.7 C) (12/13 0449) Pulse Rate:  [84-89] 85 (12/13 0449) Resp:  [16-18] 16 (12/13 0449) BP: (133-146)/(79-85) 133/79 mmHg (12/13 0449) SpO2:  [99 %-100 %] 99 % (12/13 0449) Weight:  [194 lb 6.4 oz (88.179 kg)] 194 lb 6.4 oz (88.179 kg) (12/13 0500) Last BM Date: 06/18/13 (loose in ileostomy)  Intake/Output from previous day: 12/12 0701 - 12/13 0700 In: 2052 [P.O.:660; I.V.:900; TPN:492] Out: 2640 [Urine:850; Stool:1790] Intake/Output this shift:   General appearance: alert, cooperative and no distress Resp: nonlabored GI: soft, NT, no distention, no peritoneal signs.  ostomy with thicker green output.   Lab Results:  No results found for this basename: WBC, HGB, HCT, PLT,  in the last 72 hours BMET  Recent Labs  06/17/13 0345 06/18/13 0340  NA 134* 132*  K 4.2 4.0  CL 90* 92*  CO2 31 26  GLUCOSE 97 99  BUN 85* 81*  CREATININE 9.00* 9.21*  CALCIUM 9.1 9.0   PT/INR No results found for this basename: LABPROT, INR,  in the last 72 hours ABG No results found for this basename: PHART, PCO2, PO2, HCO3,  in the last 72 hours  Studies/Results: No results found.  Anti-infectives: Anti-infectives   Start     Dose/Rate Route Frequency Ordered Stop   06/05/13 1400  piperacillin-tazobactam (ZOSYN) IVPB 3.375 g  Status:  Discontinued     3.375 g 12.5 mL/hr over 240 Minutes Intravenous 3 times per day 06/05/13 1159 06/10/13 0815   06/01/13 1600  piperacillin-tazobactam (ZOSYN) IVPB 2.25 g  Status:  Discontinued     2.25 g 100 mL/hr over 30 Minutes Intravenous 4 times per day 06/01/13 1024 06/05/13 1159   05/31/13 1300  piperacillin-tazobactam (ZOSYN) IVPB 3.375 g  Status:  Discontinued     3.375 g 12.5 mL/hr over 240 Minutes Intravenous 3 times per day 05/31/13 1212 06/01/13 1025   05/21/13 0200   cefoTEtan (CEFOTAN) 2 g in dextrose 5 % 50 mL IVPB     2 g 100 mL/hr over 30 Minutes Intravenous Every 12 hours 05/20/13 1808 05/21/13 0203   05/20/13 0551  cefoTEtan (CEFOTAN) 2 g in dextrose 5 % 50 mL IVPB     2 g 100 mL/hr over 30 Minutes Intravenous On call to O.R. 05/20/13 0551 05/20/13 1345      Assessment/Plan: s/p Procedure(s): LAPAROSCOPIC LOW ANTERIOR RESECTION WITH SPLENIC FLEXURE MOBILIZATION (N/A) diverting OSTOMY (N/A) CT last week showed persistent dilated loops of small bowel with transition point and decompressed small bowel distally.  Early post op bowel obstruction: SBFT shows resolution. Tolerated soft diet yesterday with no increase in ileostomy output and no nausea.  Cont soft diet today.  Will add fiber and iron to help slow ostomy output. Wean TPN  ARF: Renal labs slightly worse agian today.  Etiology seems to be ATN per Nephrology.  UOP about the same.  No SOB, no hyperkalemia, mentating well.  IVF's per nephrology, appreciate assistance.  Monitor UOP closely.  Cont foley per Nephrology to monitor UOP closely.  Appreciate their assistance with management of this difficult situation.   Cont ambulation, SCD's, Inc spirometry   LOS: 29 days    Khaleef Ruby C. [id number]

## 2013-06-18 NOTE — Progress Notes (Signed)
PARENTERAL NUTRITION CONSULT NOTE - FOLLOW UP  Pharmacy Consult for TNA Indication: Post-op Ileus  Allergies  Allergen Reactions  . Nsaids     Asked by surgeon to add this medication class as intolerance due to patients renal insufficiency.    Patient Measurements: Height: 5\' 7"  (170.2 cm) Weight: 194 lb 6.4 oz (88.179 kg) IBW/kg (Calculated) : 66.1 Adjusted Body Weight: 73kg Usual Weight: 104kg  Vital Signs: Temp: 98 F (36.7 C) (12/13 0449) Temp src: Oral (12/13 0449) BP: 133/79 mmHg (12/13 0449) Pulse Rate: 85 (12/13 0449)   Intake/Output from previous day: 12/12 0701 - 12/13 0700 In: 2052 [P.O.:660; I.V.:900; TPN:492] Out: 2640 [Urine:850; Stool:1790]  Labs: No results found for this basename: WBC, HGB, HCT, PLT, APTT, INR,  in the last 72 hours  Recent Labs  06/16/13 0330 06/16/13 1443 06/17/13 0345 06/18/13 0340  NA 138  134*  --  134* 132*  K 4.5  4.3  --  4.2 4.0  CL 92*  90*  --  90* 92*  CO2 33*  33*  --  31 26  GLUCOSE 125*  124*  --  97 99  BUN 86*  86*  --  85* 81*  CREATININE 8.40*  8.40*  --  9.00* 9.21*  LABCREA  --  98.6  --   --   CALCIUM 9.9  9.6  --  9.1 9.0  MG 1.7  --   --   --   PHOS 6.3*  6.2*  --  6.3* 6.2*  PROT 6.7  --   --   --   ALBUMIN 2.5*  2.5*  --  2.4* 2.4*  AST 12  --   --   --   ALT 13  --   --   --   ALKPHOS 166*  --   --   --   BILITOT 0.2*  --   --   --   TRIG 204*  --   --   --     Recent Labs  06/18/13 0010 06/18/13 0408 06/18/13 0807  GLUCAP 97 94 109*   Insulin Requirements in the past 24 hours:   CGS 94-130, required 2 units SSI - moderate scale q4h  Nutritional Goals:   Per RD recs 12/5: 1800-2000 Kcal/day, 100-110 g protein/day  Per RD recs 12/8: 1800- 2000 kcal/day, 90-100 g/day protein (given renal injury)  Goal adjustment (12/11):  Clinimix 5/15 (electrolyte free) @ 35 ml/hr and IV Fat Emulsion 20% at 6 ml/hr will provide 42g protein, 884 Kcal (pharmacy adjusting TNA rate starting  12/10 AM given AKI, aiming for ~0.6g/kg/day protein using IBW for protein calculations)  Current Nutrition:   Diet: Renal diet, starting 12/12  Clinimix 5/15 at 35 ml/hr + 20% Lipids at 31ml/hr  IVF: per nephrology, currently NS at 75 ml/hr  Assessment: 29 yoM with rectal cancer s/p lap assisted LAR with coloanal anastomosis and diverting loop ileostomy, now with post-op ileus. TNA started 11/21 since surgery does not anticipate bowel function to return over next couple of days and patient has acute protein calorie malnutrition.   Glucose - CBGs at goal <150   Electrolytes - Na 132 (unable to adjust).  Phos elevated at 6.2, despite removing electrolytes removed from TNA 12/11.  Corrected Ca 10.28 (decreasing) giving a [Ca][Phos] product of 63.    Renal function: AKI on CKD3. SCr continues to rise, today 9.21, CrCl ~8.4.  Baseline Scr 1.8 to 2.1 per Renal. UOP 0.4 ml/kg/hr  LFTs -  WNL (12/8). Alk phos elevated at 166 (12/11)  TGs - 147 (12/8), 204 (12/10)  Prealbumin - improving.  9.8 (11/20), 11.3 (11/22), 14.2 (11/24), 18.1 (12/1), 17.2 (12/8)  12/13:  Scr continuing to trending up.  Per Nephrology, this appears to be ATN and the pt may be approaching the ATN plateau phase.  Continue to restrict protein provided by TNA while patient shows AKI.  Ileostomy output 1790 mL yesterday.  NG tube removed 12/12 and pt advanced to PO diet; intake recorded at 50% lunch and 80% dinner yesterday, 100% of breakfast today.  TNA already at reduced rate, and per CCS, continue low rate today, may try to wean off soon.  TPN Access: Double lumen PICC placed 11/20 TPN day#: 22  Plan:  Continue reduced-rate.  Clinimix (electrolyte-free) 5/15 at 35 ml/hr.  Restricting protein for acute renal insufficiency.    TNA to contain standard multivitamins and trace elements.  Monitor electrolytes closely   Continue IV fat emulsion 20% at reduced rate of 6 ml/hr so lipids do not represent more than 30% of  parenteral nutrition.    Continue moderate scale SSI q4h  TNA lab panels on Mondays & Thursdays.  Renal function panel ordered daily.  F/u daily.  Gretta Arab PharmD, BCPS Pager [phone] 06/18/2013 11:04 AM

## 2013-06-18 NOTE — Progress Notes (Signed)
Patient ID: Jason [name], male   DOB: February 09, 1946, 67 y.o.   MRN: [id number]    [facility] Progress Note    Assessment/ Plan:   1. AKI on CKD3: initial injury from vol depletion, second injury likely ATN; creat levelling off perhaps, no uremic signs or symptoms, no indication for HD yet.  Cont supportive care. 2. Rectal/Colon cancer s/p LAR with diverting colostomy: improving 3. Anemia: Low hemoglobin status post surgery-appears to be stable, no indications for PRBCs at this point.  4. Hypertension: Blood pressure slightly elevated-on carvedilol 3.125 mg twice a day-will transiently tolerate higher blood pressures instead of aggressive of pressure control with its hemodynamic impact on renal recovery.  Kelly Splinter MD (pgr) [phone]    ([phone] 06/18/2013, 4:41 PM   Subjective:   Eating well now, UOP 850cc yesterday   Objective:   BP 152/83  Pulse 79  Temp(Src) 98.4 F (36.9 C) (Oral)  Resp 18  Ht 5\' 7"  (1.702 m)  Wt 88.179 kg (194 lb 6.4 oz)  BMI 30.44 kg/m2  SpO2 100%  Intake/Output Summary (Last 24 hours) at 06/18/13 1638 Last data filed at 06/18/13 1611  Gross per 24 hour  Intake    824 ml  Output   2750 ml  Net  -1926 ml   Weight change: 1.043 kg (2 lb 4.8 oz)  Physical Exam: Gen: Comfortably resting in bed-nurse by bedside CVS: Pulse regular in rate and rhythm, S1 and S2 with ESM Resp: Clear to auscultation bilaterally-no rales Abd: Soft, obese, right lower quadrant ostomy with thin greenish output Ext: No lower extremity edema  Imaging: No results found.  Labs: BMET  Recent Labs Lab 06/12/13 0422 06/13/13 0410 06/14/13 0625 06/15/13 0612 06/16/13 0330 06/17/13 0345 06/18/13 0340  NA 134* 136  135 133* 134* 138  134* 134* 132*  K 3.9 4.6  4.6 5.5* 4.7 4.5  4.3 4.2 4.0  CL 98 97  96 96 92* 92*  90* 90* 92*  CO2 27 29  29 28 31  33*  33* 31 26  GLUCOSE 128* 129*  126* 119* 121* 125*  124* 97 99  BUN 47* 57*  57* 68*  81* 86*  86* 85* 81*  CREATININE 3.48* 4.46*  4.48* 5.68* 7.30* 8.40*  8.40* 9.00* 9.21*  CALCIUM 9.1 9.6  9.6 9.1 9.9 9.9  9.6 9.1 9.0  PHOS 4.7* 5.0*  4.9* 4.0 4.6 6.3*  6.2* 6.3* 6.2*   CBC  Recent Labs Lab 06/13/13 0410  WBC 9.6  NEUTROABS 7.8*  HGB 8.6*  HCT 26.1*  MCV 86.1  PLT 241    Medications:    . carvedilol  3.125 mg Oral BID WC  . ferrous sulfate  325 mg Oral BID WC  . heparin subcutaneous  5,000 Units Subcutaneous Q8H  . insulin aspart  0-15 Units Subcutaneous Q4H  . psyllium  1 packet Oral BID  . sodium chloride  3 mL Intravenous Q12H

## 2013-06-18 NOTE — Consult Note (Signed)
WOC ostomy follow up Stoma type/location: RLQ ileostomy Stomal assessment/size: 1 and 1/8 inches round Peristomal assessment: intact, clear Treatment options for stomal/peristomal skin: None indicated Output thin, frothing green effluent Ostomy pouching: 2pc. , 2 and 1/4 inch system Education provided: Patient has just finished eating and is experiencing crampy abdominal pain and he is requesting pain medication.  No education offered today. [facility] nursing team will follow,and will remain available to this patient, the nursing and surgical team.  Please re-consult if needed between visits. Thanks, Maudie Flakes, MSN, RN, [place], [place], [place] [phone])

## 2013-06-19 LAB — GLUCOSE, CAPILLARY
Glucose-Capillary: 102 mg/dL — ABNORMAL HIGH (ref 70–99)
Glucose-Capillary: 108 mg/dL — ABNORMAL HIGH (ref 70–99)
Glucose-Capillary: 121 mg/dL — ABNORMAL HIGH (ref 70–99)
Glucose-Capillary: 95 mg/dL (ref 70–99)
Glucose-Capillary: 96 mg/dL (ref 70–99)

## 2013-06-19 LAB — CBC
HCT: 23 % — ABNORMAL LOW (ref 39.0–52.0)
Hemoglobin: 7.8 g/dL — ABNORMAL LOW (ref 13.0–17.0)
MCHC: 33.9 g/dL (ref 30.0–36.0)
MCV: 85.5 fL (ref 78.0–100.0)
RBC: 2.69 MIL/uL — ABNORMAL LOW (ref 4.22–5.81)
RDW: 14.4 % (ref 11.5–15.5)

## 2013-06-19 LAB — RENAL FUNCTION PANEL
Albumin: 2.5 g/dL — ABNORMAL LOW (ref 3.5–5.2)
BUN: 85 mg/dL — ABNORMAL HIGH (ref 6–23)
CO2: 26 mEq/L (ref 19–32)
Calcium: 9.2 mg/dL (ref 8.4–10.5)
Chloride: 96 mEq/L (ref 96–112)
Creatinine, Ser: 9.74 mg/dL — ABNORMAL HIGH (ref 0.50–1.35)
Glucose, Bld: 104 mg/dL — ABNORMAL HIGH (ref 70–99)

## 2013-06-19 MED ORDER — TRACE MINERALS CR-CU-F-FE-I-MN-MO-SE-ZN IV SOLN
INTRAVENOUS | Status: AC
  Administered 2013-06-19: 18:00:00 via INTRAVENOUS
  Filled 2013-06-19: qty 1000

## 2013-06-19 MED ORDER — DARBEPOETIN ALFA-POLYSORBATE 100 MCG/0.5ML IJ SOLN
100.0000 ug | INTRAMUSCULAR | Status: DC
  Administered 2013-06-19 – 2013-06-26 (×2): 100 ug via SUBCUTANEOUS
  Filled 2013-06-19 (×4): qty 0.5

## 2013-06-19 MED ORDER — FAT EMULSION 20 % IV EMUL
144.0000 mL | INTRAVENOUS | Status: AC
  Administered 2013-06-19: 144 mL via INTRAVENOUS
  Filled 2013-06-19: qty 200

## 2013-06-19 MED ORDER — POTASSIUM CHLORIDE 10 MEQ/50ML IV SOLN
10.0000 meq | INTRAVENOUS | Status: AC
  Administered 2013-06-19: 10 meq via INTRAVENOUS
  Filled 2013-06-19 (×2): qty 50

## 2013-06-19 NOTE — Progress Notes (Signed)
PARENTERAL NUTRITION CONSULT NOTE - FOLLOW UP  Pharmacy Consult for TNA Indication: Post-op Ileus  Allergies  Allergen Reactions  . Nsaids     Asked by surgeon to add this medication class as intolerance due to patients renal insufficiency.    Patient Measurements: Height: 5\' 7"  (170.2 cm) Weight: 194 lb 3.2 oz (88.089 kg) IBW/kg (Calculated) : 66.1 Adjusted Body Weight: 73kg Usual Weight: 104kg  Vital Signs: Temp src: Oral (12/14 0621) BP: 137/93 mmHg (12/14 0621) Pulse Rate: 88 (12/14 0621)   Intake/Output from previous day: 12/13 0701 - 12/14 0700 In: 3086.1 [P.O.:480; I.V.:1685; TPN:921.1] Out: 1925 [Urine:925; Stool:1000]  Labs: No results found for this basename: WBC, HGB, HCT, PLT, APTT, INR,  in the last 72 hours  Recent Labs  06/16/13 1443 06/17/13 0345 06/18/13 0340 06/19/13 0500  NA  --  134* 132* 135  K  --  4.2 4.0 3.9  CL  --  90* 92* 96  CO2  --  31 26 26   GLUCOSE  --  97 99 104*  BUN  --  85* 81* 85*  CREATININE  --  9.00* 9.21* 9.74*  LABCREA 98.6  --   --   --   CALCIUM  --  9.1 9.0 9.2  PHOS  --  6.3* 6.2* 5.9*  ALBUMIN  --  2.4* 2.4* 2.5*    Recent Labs  06/18/13 2015 06/19/13 0015 06/19/13 0412  GLUCAP 92 108* 96   Insulin Requirements in the past 24 hours:   CGS 92-109, required no SSI - moderate scale q4h  Nutritional Goals:   Per RD recs 12/5: 1800-2000 Kcal/day, 100-110 g protein/day  Per RD recs 12/8: 1800- 2000 kcal/day, 90-100 g/day protein  Per RD recs 12/12: 1800- 2000 kcal/day, 52-70 g/day protein (given renal injury)  Goal adjustment (12/11):  Clinimix 5/15 (electrolyte free) @ 35 ml/hr and IV Fat Emulsion 20% at 6 ml/hr will provide 42g protein, 884 Kcal (pharmacy adjusting TNA rate starting 12/10 AM given AKI, aiming for ~0.6g/kg/day protein using IBW for protein calculations)  Current Nutrition:   Diet: Dysphagia 3 diet, starting 12/12  Clinimix 5/15 at 35 ml/hr + 20% Lipids at 53ml/hr  IVF: per nephrology,  currently NS at 75 ml/hr  Assessment: 76 yoM with rectal cancer s/p lap assisted LAR with coloanal anastomosis and diverting loop ileostomy, now with post-op ileus. TNA started 11/21 since surgery does not anticipate bowel function to return over next couple of days and patient has acute protein calorie malnutrition.   Glucose - CBGs at goal <150   Electrolytes - Na wnl, K 3.9 (goal > 4), Phos elevated but improved to 5.9 (Elytes removed from [facility] 12/11.)  Corrected Ca 10.4 giving a [Ca][Phos] product of 61.    Renal function: AKI on CKD3. SCr continues to rise, today 9.74, CrCl ~8.  Baseline Scr 1.8 to 2.1 per Renal. UOP 0.43 ml/kg/hr  LFTs - WNL (12/8). Alk phos elevated at 166 (12/11)  TGs - 147 (12/8), 204 (12/10)  Prealbumin - improving.  9.8 (11/20), 11.3 (11/22), 14.2 (11/24), 18.1 (12/1), 17.2 (12/8)  12/14:  Scr continuing to trending up.  Per Nephrology, this appears to be ATN.  Continue to restrict protein provided by TNA while patient shows AKI.  Ileostomy output 1000 mL yesterday.  NG tube removed 12/12 and pt advanced to PO diet; intake recorded at 100% breakfast, 80% lunch, no dinner recorded yesterday.  RN documents vomiting ~0400 today (possible related to uremic symptoms); then 100% of  breakfast documented at 0900.  TNA already at reduced rate, and will continue low rate today to ensure no further GI issues.  Transfer to [facility], may need dialysis if not improving.  TPN Access: Double lumen PICC placed 11/20 TPN day#: 23  Plan:  Continue reduced-rate Clinimix (electrolyte-free) 5/15 at 35 ml/hr.  Restricting protein for acute renal insufficiency.    TNA to contain standard multivitamins and trace elements.  Monitor electrolytes closely   Continue IV fat emulsion 20% at reduced rate of 6 ml/hr (so lipids do not represent more than 30% of parenteral nutrition.)  KCl 10 mEq IV x2 runs.  Continue moderate scale SSI q4h.  TNA lab panels on Mondays & Thursdays.  Renal  function panel ordered daily.  F/u daily.  Gretta Arab PharmD, BCPS Pager [phone] 06/19/2013 11:36 AM

## 2013-06-19 NOTE — Progress Notes (Signed)
Pt had an episode on nausea and vomiting. Zofran given. Vomit appeared to be food he has eaten at dinner. Vomit looked like old food.

## 2013-06-19 NOTE — Progress Notes (Signed)
   [facility] Progress Note    Subjective: having nausea, no sob, + emesis   Exam  Blood pressure 137/93, pulse 88, temperature 99.1 F (37.3 C), temperature source Oral, resp. rate 18, height 5\' 7"  (1.702 m), weight 88.089 kg (194 lb 3.2 oz), SpO2 100.00%. Gen: alert and in no distress Neck: no jvd Cor: reg no rub or M Resp: clear bilat Abd: Soft, obese, right lower quadrant ostomy with thin greenish output  Ext: no LE or UE edema Neuro: alert, nf, ox3  Assessment/Plan: 1. Acute on CKD3: initial injury d/t vol depletion, current issue likely ATN; no improvement in creatinine, pt nauseous and vomiting, probably uremic symptoms > recommend transfer to [facility] hospital, will likely need dialysis in a day or two if not better, have d/w patient and primary MD 2. Rectal/Colon cancer s/p LAR with diverting colostomy: improved postop ileus, eating now 3. Anemia: last Hb 8.6, will repeat today, start esa with darbe 100/wk, s/p 500mg  IV Fe this admit 4. HTN: good range on coreg 3.125 bid, keep BP up, don't overcontrol with AKI   Kelly Splinter MD (pgr) [phone]    ([phone] 06/19/2013, 9:27 AM   Recent Labs Lab 06/17/13 0345 06/18/13 0340 06/19/13 0500  NA 134* 132* 135  K 4.2 4.0 3.9  CL 90* 92* 96  CO2 31 26 26   GLUCOSE 97 99 104*  BUN 85* 81* 85*  CREATININE 9.00* 9.21* 9.74*  CALCIUM 9.1 9.0 9.2  PHOS 6.3* 6.2* 5.9*    Recent Labs Lab 06/13/13 0410  06/16/13 0330 06/17/13 0345 06/18/13 0340 06/19/13 0500  AST 10  --  12  --   --   --   ALT 8  --  13  --   --   --   ALKPHOS 130*  --  166*  --   --   --   BILITOT 0.2*  --  0.2*  --   --   --   PROT 6.2  --  6.7  --   --   --   ALBUMIN 2.3*  2.3*  < > 2.5*  2.5* 2.4* 2.4* 2.5*  < > = values in this interval not displayed.  Recent Labs Lab 06/13/13 0410  WBC 9.6  NEUTROABS 7.8*  HGB 8.6*  HCT 26.1*  MCV 86.1  PLT 241   . carvedilol  3.125 mg Oral BID WC  . ferrous sulfate  325 mg Oral BID  WC  . heparin subcutaneous  5,000 Units Subcutaneous Q8H  . insulin aspart  0-15 Units Subcutaneous Q4H  . potassium chloride  10 mEq Intravenous Q1 Hr x 2  . psyllium  1 packet Oral BID  . sodium chloride  3 mL Intravenous Q12H   . sodium chloride 75 mL/hr at 06/19/13 0555  . TPN (CLINIMIX) Adult without lytes 35 mL/hr at 06/18/13 1736   And  . fat emulsion 144 mL (06/18/13 1737)   sodium chloride, HYDROmorphone (DILAUDID) injection, lip balm, metoprolol, ondansetron (ZOFRAN) IV, sodium chloride, sodium chloride

## 2013-06-19 NOTE — Progress Notes (Addendum)
30 Days Post-Op  Subjective: Feeling fine.  Had an episode of emesis yesterday, felt fine after that.  No nausea  Objective: Vital signs in last 24 hours: Temp:  [98.4 F (36.9 C)-99.1 F (37.3 C)] 99.1 F (37.3 C) (12/13 2017) Pulse Rate:  [79-88] 88 (12/14 0621) Resp:  [18] 18 (12/14 0621) BP: (137-152)/(83-93) 137/93 mmHg (12/14 0621) SpO2:  [100 %] 100 % (12/14 0621) Weight:  [194 lb 3.2 oz (88.089 kg)] 194 lb 3.2 oz (88.089 kg) (12/13 2017) Last BM Date: 06/18/13 (loose in ileostomy)  Intake/Output from previous day: 12/13 0701 - 12/14 0700 In: 3086.1 [P.O.:480; I.V.:1685; TPN:921.1] Out: 1925 [Urine:925; Stool:1000] Intake/Output this shift: Total I/O In: -  Out: 25 [Stool:25] General appearance: alert, cooperative and no distress Resp: nonlabored GI: soft, NT, no distention, no peritoneal signs.  ostomy with thick bilious output.   Lab Results:  No results found for this basename: WBC, HGB, HCT, PLT,  in the last 72 hours BMET  Recent Labs  06/18/13 0340 06/19/13 0500  NA 132* 135  K 4.0 3.9  CL 92* 96  CO2 26 26  GLUCOSE 99 104*  BUN 81* 85*  CREATININE 9.21* 9.74*  CALCIUM 9.0 9.2   PT/INR No results found for this basename: LABPROT, INR,  in the last 72 hours ABG No results found for this basename: PHART, PCO2, PO2, HCO3,  in the last 72 hours  Studies/Results: No results found.  Anti-infectives: Anti-infectives   Start     Dose/Rate Route Frequency Ordered Stop   06/05/13 1400  piperacillin-tazobactam (ZOSYN) IVPB 3.375 g  Status:  Discontinued     3.375 g 12.5 mL/hr over 240 Minutes Intravenous 3 times per day 06/05/13 1159 06/10/13 0815   06/01/13 1600  piperacillin-tazobactam (ZOSYN) IVPB 2.25 g  Status:  Discontinued     2.25 g 100 mL/hr over 30 Minutes Intravenous 4 times per day 06/01/13 1024 06/05/13 1159   05/31/13 1300  piperacillin-tazobactam (ZOSYN) IVPB 3.375 g  Status:  Discontinued     3.375 g 12.5 mL/hr over 240 Minutes  Intravenous 3 times per day 05/31/13 1212 06/01/13 1025   05/21/13 0200  cefoTEtan (CEFOTAN) 2 g in dextrose 5 % 50 mL IVPB     2 g 100 mL/hr over 30 Minutes Intravenous Every 12 hours 05/20/13 1808 05/21/13 0203   05/20/13 0551  cefoTEtan (CEFOTAN) 2 g in dextrose 5 % 50 mL IVPB     2 g 100 mL/hr over 30 Minutes Intravenous On call to O.R. 05/20/13 0551 05/20/13 1345      Assessment/Plan: s/p Procedure(s): LAPAROSCOPIC LOW ANTERIOR RESECTION WITH SPLENIC FLEXURE MOBILIZATION (N/A) diverting OSTOMY (N/A) CT last week showed persistent dilated loops of small bowel with transition point and decompressed small bowel distally.  Early post op bowel obstruction: SBFT shows resolution.  Cont soft diet today.  Will stop iron supplement in case this is the cause of his GI distress yesterday.   Cont half rate TPN today  ARF: Renal labs slightly worse agian today.  Etiology seems to be ATN per Nephrology.  UOP about the same.  No SOB, no hyperkalemia, mentating well.  IVF's per nephrology, appreciate assistance.  Monitor UOP closely.  Cont foley per Nephrology to monitor UOP closely.  Appreciate their assistance with management of this difficult situation.  Will transfer to [facility] for possible HD tomorrow if renal function is not improved  Cont ambulation, SCD's, Inc spirometry   LOS: 30 days    [place], [place]  C. 06/19/2013

## 2013-06-20 LAB — COMPREHENSIVE METABOLIC PANEL
ALT: 12 U/L (ref 0–53)
AST: 11 U/L (ref 0–37)
Albumin: 2.3 g/dL — ABNORMAL LOW (ref 3.5–5.2)
CO2: 22 mEq/L (ref 19–32)
Calcium: 8.9 mg/dL (ref 8.4–10.5)
Chloride: 96 mEq/L (ref 96–112)
Creatinine, Ser: 9.82 mg/dL — ABNORMAL HIGH (ref 0.50–1.35)
GFR calc Af Amer: 6 mL/min — ABNORMAL LOW (ref >90)
GFR calc non Af Amer: 5 mL/min — ABNORMAL LOW (ref >90)
Glucose, Bld: 102 mg/dL — ABNORMAL HIGH (ref 70–99)
Potassium: 4.2 mEq/L (ref 3.5–5.1)
Sodium: 133 mEq/L — ABNORMAL LOW (ref 135–145)
Total Bilirubin: 0.2 mg/dL — ABNORMAL LOW (ref 0.3–1.2)
Total Protein: 6.5 g/dL (ref 6.0–8.3)

## 2013-06-20 LAB — CBC
HCT: 22.4 % — ABNORMAL LOW (ref 39.0–52.0)
Hemoglobin: 7.8 g/dL — ABNORMAL LOW (ref 13.0–17.0)
MCHC: 34.8 g/dL (ref 30.0–36.0)
MCV: 85.5 fL (ref 78.0–100.0)
Platelets: 198 10*3/uL (ref 150–400)
RDW: 14.3 % (ref 11.5–15.5)
WBC: 7.8 10*3/uL (ref 4.0–10.5)

## 2013-06-20 LAB — PREALBUMIN: Prealbumin: 21.1 mg/dL (ref 17.0–34.0)

## 2013-06-20 LAB — DIFFERENTIAL
Basophils Absolute: 0 10*3/uL (ref 0.0–0.1)
Basophils Relative: 0 % (ref 0–1)
Eosinophils Absolute: 0.1 10*3/uL (ref 0.0–0.7)
Eosinophils Relative: 2 % (ref 0–5)
Lymphocytes Relative: 9 % — ABNORMAL LOW (ref 12–46)
Monocytes Absolute: 0.6 10*3/uL (ref 0.1–1.0)
Neutrophils Relative %: 82 % — ABNORMAL HIGH (ref 43–77)

## 2013-06-20 LAB — GLUCOSE, CAPILLARY
Glucose-Capillary: 100 mg/dL — ABNORMAL HIGH (ref 70–99)
Glucose-Capillary: 107 mg/dL — ABNORMAL HIGH (ref 70–99)
Glucose-Capillary: 125 mg/dL — ABNORMAL HIGH (ref 70–99)
Glucose-Capillary: 154 mg/dL — ABNORMAL HIGH (ref 70–99)

## 2013-06-20 LAB — TRIGLYCERIDES: Triglycerides: 165 mg/dL — ABNORMAL HIGH (ref <150)

## 2013-06-20 MED ORDER — ACETAMINOPHEN 325 MG PO TABS
650.0000 mg | ORAL_TABLET | ORAL | Status: DC | PRN
  Administered 2013-06-20: 650 mg via ORAL
  Filled 2013-06-20: qty 2

## 2013-06-20 NOTE — Progress Notes (Signed)
PARENTERAL NUTRITION CONSULT NOTE - FOLLOW UP  Pharmacy Consult for TNA Indication: Post-op Ileus  Allergies  Allergen Reactions  . Nsaids     Asked by surgeon to add this medication class as intolerance due to patients renal insufficiency.    Patient Measurements: Height: 5\' 7"  (170.2 cm) Weight: 196 lb 3.2 oz (88.996 kg) IBW/kg (Calculated) : 66.1 Adjusted Body Weight: 73 kg Usual Weight: 104 kg  Vital Signs: Temp: 98.2 F (36.8 C) (12/15 1000) Temp src: Oral (12/15 1000) BP: 167/89 mmHg (12/15 1000) Pulse Rate: 77 (12/15 1000) Intake/Output from previous day: 12/14 0701 - 12/15 0700 In: 360 [P.O.:360] Out: 1050 [Urine:775; Stool:275] Intake/Output from this shift: Total I/O In: 240 [P.O.:240] Out: -   Labs:  Recent Labs  06/19/13 1125 06/20/13 0445  WBC 8.3 7.8  HGB 7.8* 7.8*  HCT 23.0* 22.4*  PLT 200 198     Recent Labs  06/18/13 0340 06/19/13 0500 06/20/13 0445  NA 132* 135 133*  K 4.0 3.9 4.2  CL 92* 96 96  CO2 26 26 22   GLUCOSE 99 104* 102*  BUN 81* 85* 81*  CREATININE 9.21* 9.74* 9.82*  CALCIUM 9.0 9.2 8.9  MG  --   --  1.4*  PHOS 6.2* 5.9* 6.1*  PROT  --   --  6.5  ALBUMIN 2.4* 2.5* 2.3*  AST  --   --  11  ALT  --   --  12  ALKPHOS  --   --  134*  BILITOT  --   --  0.2*  PREALBUMIN  --   --  21.1  TRIG  --   --  165*   Estimated Creatinine Clearance: 7.9 ml/min (by C-G formula based on Cr of 9.82).    Recent Labs  06/19/13 2007 06/20/13 0006 06/20/13 0406  GLUCAP 121* 100* 111*    Insulin Requirements in the past 12 hours:  2 units SSI  Current Nutrition:  Renal diet  -  Clinimix 5/15 (no electrolytes) at 35 ml/hr + 20% lipids at 6 ml/hr  Nutritional Goals:  1800-2000 kCal, 52-70 grams of protein, 2.3-2.5 L of fluid daily per RD note on 12/12  Assessment: 51 yoM with rectal cancer s/p lap assisted LAR with coloanal anastomosis and diverting loop ileostomy, now with post-op ileus. TNA started 11/21 since surgery does  not anticipate bowel function to return over next couple of days and patient has acute protein calorie malnutrition.   GI: s/p lap assisted LAR with coloanal anastomosis and diverting loop ileostomy and noted to have post-op ileus. TNA started 11/21 for nutritional support. NG tube removed 12/12 and pt advanced to po diet. The patient ate 100% of breakfast and is tolerating lunch well today - discussed with Dr. Leighton Ruff and will plan to stop TPN after today's bag. TPN can always be re-initiated if deemed necessary.  Endo: CBGs 102-129. On moderate SSI Lytes: Na 133, K 4.2, Mg 1.4, Phos 6.1 -- will leave electrolyte repletion up to renal MD Renal: AoCKD3. SCr 9.82, CrCl<10 ml/min. UOP/24h:  0.4 ml/kg/hr. Transferred to [facility] for possible need for HD Pulm:  100 % on RA Cards: BP wnl-elevated, HR wnl. On coreg, psyllium  Hepatobil: LFTs wnl, Alk Phos 134, Tbili wnl, alb 2.3, pre-albumin 17.2 (12/8)  ID:   Afebrile, WBC wnl Best Practices: Hep SQ TPN Access: Double lumen PICC placed 11/20  TPN day#: 24  Plan - D/c TPN after today's bag - Will d/c TPN labs and consult -  Please re-consult pharmacy if TPN needs to be re-initiated.  Alycia Rossetti, PharmD, BCPS Clinical Pharmacist Pager: [phone] 06/20/2013 1:43 PM

## 2013-06-20 NOTE — Progress Notes (Signed)
Utilization review completed.  

## 2013-06-20 NOTE — Progress Notes (Signed)
Patient ID: Jason [name], male   DOB: [birth year], 67 y.o.   MRN: [id number] S:feels good but admits to having some pain in his stomach after he eats.   O:BP 167/89  Pulse 77  Temp(Src) 98.2 F (36.8 C) (Oral)  Resp 18  Ht 5\' 7"  (1.702 m)  Wt 88.996 kg (196 lb 3.2 oz)  BMI 30.72 kg/m2  SpO2 100%  Intake/Output Summary (Last 24 hours) at 06/20/13 1301 Last data filed at 06/20/13 0900  Gross per 24 hour  Intake    360 ml  Output    875 ml  Net   -515 ml   Intake/Output: I/O last 3 completed shifts: In: 360 [P.O.:360] Out: 1900 [Urine:1375; Stool:525]  Intake/Output this shift:  Total I/O In: 240 [P.O.:240] Out: -  Weight change: 0.811 kg (1 lb 12.6 oz) Gen:WD WN AAM in NAD CVS:rrr  Resp:occ rhonchi and decreased BS at bases Abd:+BS, soft, colostomy in place Ext:no edema   Recent Labs Lab 06/14/13 0625 06/15/13 0612 06/16/13 0330 06/17/13 0345 06/18/13 0340 06/19/13 0500 06/20/13 0445  NA 133* 134* 138  134* 134* 132* 135 133*  K 5.5* 4.7 4.5  4.3 4.2 4.0 3.9 4.2  CL 96 92* 92*  90* 90* 92* 96 96  CO2 28 31 33*  33* 31 26 26 22   GLUCOSE 119* 121* 125*  124* 97 99 104* 102*  BUN 68* 81* 86*  86* 85* 81* 85* 81*  CREATININE 5.68* 7.30* 8.40*  8.40* 9.00* 9.21* 9.74* 9.82*  ALBUMIN 2.2* 2.5* 2.5*  2.5* 2.4* 2.4* 2.5* 2.3*  CALCIUM 9.1 9.9 9.9  9.6 9.1 9.0 9.2 8.9  PHOS 4.0 4.6 6.3*  6.2* 6.3* 6.2* 5.9* 6.1*  AST  --   --  12  --   --   --  11  ALT  --   --  13  --   --   --  12   Liver Function Tests:  Recent Labs Lab 06/16/13 0330  06/18/13 0340 06/19/13 0500 06/20/13 0445  AST 12  --   --   --  11  ALT 13  --   --   --  12  ALKPHOS 166*  --   --   --  134*  BILITOT 0.2*  --   --   --  0.2*  PROT 6.7  --   --   --  6.5  ALBUMIN 2.5*  2.5*  < > 2.4* 2.5* 2.3*  < > = values in this interval not displayed. No results found for this basename: LIPASE, AMYLASE,  in the last 168 hours No results found for this basename: AMMONIA,  in the last 168  hours CBC:  Recent Labs Lab 06/19/13 1125 06/20/13 0445  WBC 8.3 7.8  NEUTROABS  --  6.4  HGB 7.8* 7.8*  HCT 23.0* 22.4*  MCV 85.5 85.5  PLT 200 198   Cardiac Enzymes: No results found for this basename: CKTOTAL, CKMB, CKMBINDEX, TROPONINI,  in the last 168 hours CBG:  Recent Labs Lab 06/19/13 1234 06/19/13 1621 06/19/13 2007 06/20/13 0006 06/20/13 0406  GLUCAP 95 102* 121* 100* 111*    Iron Studies: No results found for this basename: IRON, TIBC, TRANSFERRIN, FERRITIN,  in the last 72 hours Studies/Results: No results found. . carvedilol  3.125 mg Oral BID WC  . darbepoetin (ARANESP) injection - NON-DIALYSIS  100 mcg Subcutaneous Q Sun-1800  . heparin subcutaneous  5,000 Units Subcutaneous Q8H  . insulin aspart  0-15 Units Subcutaneous Q4H  . psyllium  1 packet Oral BID  . sodium chloride  3 mL Intravenous Q12H    BMET    Component Value Date/Time   NA 133* 06/20/2013 0445   NA 143 04/28/2013 0859   K 4.2 06/20/2013 0445   K 3.7 04/28/2013 0859   CL 96 06/20/2013 0445   CL 101 12/09/2012 1048   CO2 22 06/20/2013 0445   CO2 25 04/28/2013 0859   GLUCOSE 102* 06/20/2013 0445   GLUCOSE 104 04/28/2013 0859   GLUCOSE 96 12/09/2012 1048   GLUCOSE 114* 05/11/2006 1014   BUN 81* 06/20/2013 0445   BUN 18.5 04/28/2013 0859   CREATININE 9.82* 06/20/2013 0445   CREATININE 1.8* 04/28/2013 0859   CALCIUM 8.9 06/20/2013 0445   CALCIUM 10.1 04/28/2013 0859   GFRNONAA 5* 06/20/2013 0445   GFRAA 6* 06/20/2013 0445   CBC    Component Value Date/Time   WBC 7.8 06/20/2013 0445   WBC 5.2 04/25/2013 0910   RBC 2.62* 06/20/2013 0445   RBC 3.73* 04/25/2013 0910   RBC 4.10* 12/06/2012 1643   HGB 7.8* 06/20/2013 0445   HGB 10.4* 04/25/2013 0910   HGB 10.0* 12/06/2012 1624   HCT 22.4* 06/20/2013 0445   HCT 32.3* 04/25/2013 0910   PLT 198 06/20/2013 0445   PLT 198 04/25/2013 0910   MCV 85.5 06/20/2013 0445   MCV 86.6 04/25/2013 0910   MCH 29.8 06/20/2013 0445   MCH 27.9  04/25/2013 0910   MCHC 34.8 06/20/2013 0445   MCHC 32.2 04/25/2013 0910   RDW 14.3 06/20/2013 0445   RDW 21.4* 04/25/2013 0910   LYMPHSABS 0.7 06/20/2013 0445   LYMPHSABS 0.4* 04/25/2013 0910   MONOABS 0.6 06/20/2013 0445   MONOABS 0.9 04/25/2013 0910   EOSABS 0.1 06/20/2013 0445   EOSABS 0.1 04/25/2013 0910   BASOSABS 0.0 06/20/2013 0445   BASOSABS 0.0 04/25/2013 0910     Assessment/Plan:  1. AKI/CKD- non-oliguric.  Initially felt to be hemodynamically mediate with high output from ostomy and hypotension.  Initially responded to IVF's and now worsening without clear nephrotoxic agents.  Will continue to follow closely as he may require HD in the next 24-48 hours if no sig improvement, although the rate of rise in Scr has slowed.  Cont with IVF's for now 2. HTN- not at goal.  Would recommend increasing coreg to 6.25mg  and cont to titrate as needed 3. ACDz/malignancy- on epo 4. DM- per primary svc 5. Protein malnutrition- on TPN 6. Colorectal CA- s/p XRT and neoadjuvant chemo.  C/b high output ostomy.  Kimberly A

## 2013-06-20 NOTE — Progress Notes (Signed)
31 Days Post-Op  Subjective: No nausea yesterday but little appetite.  Transferred to [facility] for potential dialysis.  Ostomy output 1L yesterday (goal)  Objective: Vital signs in last 24 hours: Temp:  [98.5 F (36.9 C)-98.8 F (37.1 C)] 98.7 F (37.1 C) (12/15 0410) Pulse Rate:  [76-87] 76 (12/15 0410) Resp:  [17-18] 18 (12/15 0410) BP: (142-156)/(78-82) 142/78 mmHg (12/15 0410) SpO2:  [100 %] 100 % (12/15 0410) Weight:  [195 lb 15.8 oz (88.9 kg)-196 lb 3.2 oz (88.996 kg)] 196 lb 3.2 oz (88.996 kg) (12/15 0500) Last BM Date: 06/19/13  Intake/Output from previous day: 12/14 0701 - 12/15 0700 In: 360 [P.O.:360] Out: 1050 [Urine:775; Stool:275] Intake/Output this shift:   General appearance: alert, cooperative and no distress Resp: nonlabored GI: soft, NT, min distention, no peritoneal signs.  ostomy with thick bilious output.   Lab Results:   Recent Labs  06/19/13 1125 06/20/13 0445  WBC 8.3 7.8  HGB 7.8* 7.8*  HCT 23.0* 22.4*  PLT 200 198   BMET  Recent Labs  06/19/13 0500 06/20/13 0445  NA 135 133*  K 3.9 4.2  CL 96 96  CO2 26 22  GLUCOSE 104* 102*  BUN 85* 81*  CREATININE 9.74* 9.82*  CALCIUM 9.2 8.9   PT/INR No results found for this basename: LABPROT, INR,  in the last 72 hours ABG No results found for this basename: PHART, PCO2, PO2, HCO3,  in the last 72 hours  Studies/Results: No results found.  Anti-infectives: Anti-infectives   Start     Dose/Rate Route Frequency Ordered Stop   06/05/13 1400  piperacillin-tazobactam (ZOSYN) IVPB 3.375 g  Status:  Discontinued     3.375 g 12.5 mL/hr over 240 Minutes Intravenous 3 times per day 06/05/13 1159 06/10/13 0815   06/01/13 1600  piperacillin-tazobactam (ZOSYN) IVPB 2.25 g  Status:  Discontinued     2.25 g 100 mL/hr over 30 Minutes Intravenous 4 times per day 06/01/13 1024 06/05/13 1159   05/31/13 1300  piperacillin-tazobactam (ZOSYN) IVPB 3.375 g  Status:  Discontinued     3.375 g 12.5 mL/hr over  240 Minutes Intravenous 3 times per day 05/31/13 1212 06/01/13 1025   05/21/13 0200  cefoTEtan (CEFOTAN) 2 g in dextrose 5 % 50 mL IVPB     2 g 100 mL/hr over 30 Minutes Intravenous Every 12 hours 05/20/13 1808 05/21/13 0203   05/20/13 0551  cefoTEtan (CEFOTAN) 2 g in dextrose 5 % 50 mL IVPB     2 g 100 mL/hr over 30 Minutes Intravenous On call to O.R. 05/20/13 0551 05/20/13 1345      Assessment/Plan: s/p Procedure(s): LAPAROSCOPIC LOW ANTERIOR RESECTION WITH SPLENIC FLEXURE MOBILIZATION (N/A) diverting OSTOMY (N/A) CT last week showed persistent dilated loops of small bowel with transition point and decompressed small bowel distally.  Early post op bowel obstruction: SBFT shows resolution.  Will switch to renal diet today.  Cont half rate TPN today.  ARF: Renal labs slightly worse agian today.  Etiology seems to be ATN per Nephrology.  UOP about the same.  No SOB, no hyperkalemia, mentating well.  IVF's per nephrology, appreciate assistance.  Monitor UOP closely.  Cont foley per Nephrology to monitor UOP closely.    Cont ambulation, SCD's, Inc spirometry   LOS: 31 days    [place] C. [id number]

## 2013-06-21 LAB — GLUCOSE, CAPILLARY
Glucose-Capillary: 101 mg/dL — ABNORMAL HIGH (ref 70–99)
Glucose-Capillary: 102 mg/dL — ABNORMAL HIGH (ref 70–99)
Glucose-Capillary: 103 mg/dL — ABNORMAL HIGH (ref 70–99)
Glucose-Capillary: 90 mg/dL (ref 70–99)
Glucose-Capillary: 96 mg/dL (ref 70–99)

## 2013-06-21 LAB — RENAL FUNCTION PANEL
Albumin: 2.6 g/dL — ABNORMAL LOW (ref 3.5–5.2)
CO2: 21 mEq/L (ref 19–32)
Chloride: 98 mEq/L (ref 96–112)
GFR calc Af Amer: 6 mL/min — ABNORMAL LOW (ref >90)
GFR calc non Af Amer: 5 mL/min — ABNORMAL LOW (ref >90)
Potassium: 4.1 mEq/L (ref 3.5–5.1)
Sodium: 135 mEq/L (ref 135–145)

## 2013-06-21 MED ORDER — CARVEDILOL 6.25 MG PO TABS
6.2500 mg | ORAL_TABLET | Freq: Two times a day (BID) | ORAL | Status: DC
  Administered 2013-06-21 – 2013-06-28 (×14): 6.25 mg via ORAL
  Filled 2013-06-21 (×16): qty 1

## 2013-06-21 NOTE — Progress Notes (Signed)
Patient ID: Jason [name], male   DOB: Mar 03, 1946, 67 y.o.   MRN: [id number] S:feels better O:BP 162/83  Pulse 77  Temp(Src) 99 F (37.2 C) (Oral)  Resp 18  Ht 5\' 7"  (1.702 m)  Wt 88.996 kg (196 lb 3.2 oz)  BMI 30.72 kg/m2  SpO2 98%  Intake/Output Summary (Last 24 hours) at 06/21/13 0947 Last data filed at 06/21/13 0420  Gross per 24 hour  Intake    480 ml  Output   2950 ml  Net  -2470 ml   Intake/Output: I/O last 3 completed shifts: In: 720 [P.O.:720] Out: 3725 [Urine:3225; Stool:500]  Intake/Output this shift:    Weight change: 0.096 kg (3.4 oz) Gen:WD WN AAM in NAD CVS:no rub Resp:cta Abd:+_BS, soft, ostomy in LLQ Ext:no edema   Recent Labs Lab 06/15/13 0612 06/16/13 0330 06/17/13 0345 06/18/13 0340 06/19/13 0500 06/20/13 0445 06/21/13 0615  NA 134* 138  134* 134* 132* 135 133* 135  K 4.7 4.5  4.3 4.2 4.0 3.9 4.2 4.1  CL 92* 92*  90* 90* 92* 96 96 98  CO2 31 33*  33* 31 26 26 22 21   GLUCOSE 121* 125*  124* 97 99 104* 102* 87  BUN 81* 86*  86* 85* 81* 85* 81* 77*  CREATININE 7.30* 8.40*  8.40* 9.00* 9.21* 9.74* 9.82* 9.11*  ALBUMIN 2.5* 2.5*  2.5* 2.4* 2.4* 2.5* 2.3* 2.6*  CALCIUM 9.9 9.9  9.6 9.1 9.0 9.2 8.9 9.3  PHOS 4.6 6.3*  6.2* 6.3* 6.2* 5.9* 6.1* 5.7*  AST  --  12  --   --   --  11  --   ALT  --  13  --   --   --  12  --    Liver Function Tests:  Recent Labs Lab 06/16/13 0330  06/19/13 0500 06/20/13 0445 06/21/13 0615  AST 12  --   --  11  --   ALT 13  --   --  12  --   ALKPHOS 166*  --   --  134*  --   BILITOT 0.2*  --   --  0.2*  --   PROT 6.7  --   --  6.5  --   ALBUMIN 2.5*  2.5*  < > 2.5* 2.3* 2.6*  < > = values in this interval not displayed. No results found for this basename: LIPASE, AMYLASE,  in the last 168 hours No results found for this basename: AMMONIA,  in the last 168 hours CBC:  Recent Labs Lab 06/19/13 1125 06/20/13 0445  WBC 8.3 7.8  NEUTROABS  --  6.4  HGB 7.8* 7.8*  HCT 23.0* 22.4*  MCV 85.5 85.5   PLT 200 198   Cardiac Enzymes: No results found for this basename: CKTOTAL, CKMB, CKMBINDEX, TROPONINI,  in the last 168 hours CBG:  Recent Labs Lab 06/20/13 1612 06/20/13 2028 06/21/13 0010 06/21/13 0416 06/21/13 0746  GLUCAP 154* 107* 101* 96 90    Iron Studies: No results found for this basename: IRON, TIBC, TRANSFERRIN, FERRITIN,  in the last 72 hours Studies/Results: No results found. . carvedilol  3.125 mg Oral BID WC  . darbepoetin (ARANESP) injection - NON-DIALYSIS  100 mcg Subcutaneous Q Sun-1800  . heparin subcutaneous  5,000 Units Subcutaneous Q8H  . insulin aspart  0-15 Units Subcutaneous Q4H  . psyllium  1 packet Oral BID  . sodium chloride  3 mL Intravenous Q12H    BMET    Component  Value Date/Time   NA 135 06/21/2013 0615   NA 143 04/28/2013 0859   K 4.1 06/21/2013 0615   K 3.7 04/28/2013 0859   CL 98 06/21/2013 0615   CL 101 12/09/2012 1048   CO2 21 06/21/2013 0615   CO2 25 04/28/2013 0859   GLUCOSE 87 06/21/2013 0615   GLUCOSE 104 04/28/2013 0859   GLUCOSE 96 12/09/2012 1048   GLUCOSE 114* 05/11/2006 1014   BUN 77* 06/21/2013 0615   BUN 18.5 04/28/2013 0859   CREATININE 9.11* 06/21/2013 0615   CREATININE 1.8* 04/28/2013 0859   CALCIUM 9.3 06/21/2013 0615   CALCIUM 10.1 04/28/2013 0859   GFRNONAA 5* 06/21/2013 0615   GFRAA 6* 06/21/2013 0615   CBC    Component Value Date/Time   WBC 7.8 06/20/2013 0445   WBC 5.2 04/25/2013 0910   RBC 2.62* 06/20/2013 0445   RBC 3.73* 04/25/2013 0910   RBC 4.10* 12/06/2012 1643   HGB 7.8* 06/20/2013 0445   HGB 10.4* 04/25/2013 0910   HGB 10.0* 12/06/2012 1624   HCT 22.4* 06/20/2013 0445   HCT 32.3* 04/25/2013 0910   PLT 198 06/20/2013 0445   PLT 198 04/25/2013 0910   MCV 85.5 06/20/2013 0445   MCV 86.6 04/25/2013 0910   MCH 29.8 06/20/2013 0445   MCH 27.9 04/25/2013 0910   MCHC 34.8 06/20/2013 0445   MCHC 32.2 04/25/2013 0910   RDW 14.3 06/20/2013 0445   RDW 21.4* 04/25/2013 0910   LYMPHSABS 0.7  06/20/2013 0445   LYMPHSABS 0.4* 04/25/2013 0910   MONOABS 0.6 06/20/2013 0445   MONOABS 0.9 04/25/2013 0910   EOSABS 0.1 06/20/2013 0445   EOSABS 0.1 04/25/2013 0910   BASOSABS 0.0 06/20/2013 0445   BASOSABS 0.0 04/25/2013 0910     Assessment/Plan:  1. AKI/CKD- non-oliguric. (CKD stage 3 at baseline with Scr of 1.8-2.1) Initially felt to be hemodynamically mediate with high output from ostomy and hypotension. Initially responded to IVF's and now worsening without clear nephrotoxic agents. Finally starting to improve.  Cont with IVF's for now. No indication for HD.  Unclear if there were nephrotoxic agents used for chemo. 2. HTN- not at goal. Would recommend increasing coreg to 6.25mg  and cont to titrate as needed.  Will incease today if ok with primary team 3. ACDz/malignancy- on epo 4. DM- per primary svc 5. Protein malnutrition- on TPN 6. Colorectal CA- s/p XRT and neoadjuvant chemo. C/b high output ostomy. 7. Hyperphosphatemia- improving 8.   [place] A

## 2013-06-21 NOTE — Progress Notes (Signed)
32 Days Post-Op  Subjective: Vomited again yesterday, Pt states "he ate too much".  Denies baseline nausea.  Ate all of his breakfast this am.  Ostomy output <1L  Objective: Vital signs in last 24 hours: Temp:  [98.2 F (36.8 C)-99.5 F (37.5 C)] 99 F (37.2 C) (12/16 0419) Pulse Rate:  [76-82] 77 (12/16 0419) Resp:  [18] 18 (12/16 0419) BP: (157-167)/(82-89) 162/83 mmHg (12/16 0419) SpO2:  [96 %-100 %] 98 % (12/16 0419) Weight:  [196 lb 3.2 oz (88.996 kg)] 196 lb 3.2 oz (88.996 kg) (12/15 2029) Last BM Date: 06/19/13  Intake/Output from previous day: 12/15 0701 - 12/16 0700 In: 720 [P.O.:720] Out: 2950 [Urine:2450; Stool:500] Intake/Output this shift:   General appearance: alert, cooperative and no distress Resp: nonlabored GI: soft, NT, min distention, no peritoneal signs.  ostomy with thick bilious output.   Lab Results:   Recent Labs  06/19/13 1125 06/20/13 0445  WBC 8.3 7.8  HGB 7.8* 7.8*  HCT 23.0* 22.4*  PLT 200 198   BMET  Recent Labs  06/19/13 0500 06/20/13 0445  NA 135 133*  K 3.9 4.2  CL 96 96  CO2 26 22  GLUCOSE 104* 102*  BUN 85* 81*  CREATININE 9.74* 9.82*  CALCIUM 9.2 8.9   PT/INR No results found for this basename: LABPROT, INR,  in the last 72 hours ABG No results found for this basename: PHART, PCO2, PO2, HCO3,  in the last 72 hours  Studies/Results: No results found.  Anti-infectives: Anti-infectives   Start     Dose/Rate Route Frequency Ordered Stop   06/05/13 1400  piperacillin-tazobactam (ZOSYN) IVPB 3.375 g  Status:  Discontinued     3.375 g 12.5 mL/hr over 240 Minutes Intravenous 3 times per day 06/05/13 1159 06/10/13 0815   06/01/13 1600  piperacillin-tazobactam (ZOSYN) IVPB 2.25 g  Status:  Discontinued     2.25 g 100 mL/hr over 30 Minutes Intravenous 4 times per day 06/01/13 1024 06/05/13 1159   05/31/13 1300  piperacillin-tazobactam (ZOSYN) IVPB 3.375 g  Status:  Discontinued     3.375 g 12.5 mL/hr over 240 Minutes  Intravenous 3 times per day 05/31/13 1212 06/01/13 1025   05/21/13 0200  cefoTEtan (CEFOTAN) 2 g in dextrose 5 % 50 mL IVPB     2 g 100 mL/hr over 30 Minutes Intravenous Every 12 hours 05/20/13 1808 05/21/13 0203   05/20/13 0551  cefoTEtan (CEFOTAN) 2 g in dextrose 5 % 50 mL IVPB     2 g 100 mL/hr over 30 Minutes Intravenous On call to O.R. 05/20/13 0551 05/20/13 1345      Assessment/Plan: s/p Procedure(s): LAPAROSCOPIC LOW ANTERIOR RESECTION WITH SPLENIC FLEXURE MOBILIZATION (N/A) diverting OSTOMY (N/A) S/P prolonged ileus and early post op bowel obstruction: SBFT showed resolution. Cont renal diet today. Encouraged pt to eat frequent small meals.  TPN off  ARF: Etiology seems to be ATN per Nephrology.  UOP up yesterday.  IVF's per nephrology, appreciate assistance.  Monitor UOP closely.  Cont foley per Nephrology to monitor UOP closely.  Dialysis to be decided in next 24-48h.  Cont ambulation, SCD's, Inc spirometry   LOS: 32 days    Aasim Restivo C. [id number]

## 2013-06-22 LAB — GLUCOSE, CAPILLARY
Glucose-Capillary: 88 mg/dL (ref 70–99)
Glucose-Capillary: 92 mg/dL (ref 70–99)

## 2013-06-22 LAB — RENAL FUNCTION PANEL
Albumin: 2.5 g/dL — ABNORMAL LOW (ref 3.5–5.2)
BUN: 72 mg/dL — ABNORMAL HIGH (ref 6–23)
CO2: 21 mEq/L (ref 19–32)
Calcium: 9.4 mg/dL (ref 8.4–10.5)
Chloride: 102 mEq/L (ref 96–112)
Creatinine, Ser: 8.28 mg/dL — ABNORMAL HIGH (ref 0.50–1.35)
GFR calc non Af Amer: 6 mL/min — ABNORMAL LOW (ref >90)
Glucose, Bld: 91 mg/dL (ref 70–99)

## 2013-06-22 MED ORDER — OXYCODONE-ACETAMINOPHEN 5-325 MG PO TABS
1.0000 | ORAL_TABLET | ORAL | Status: DC | PRN
  Administered 2013-06-22: 2 via ORAL
  Administered 2013-06-22: 1 via ORAL
  Administered 2013-06-23 – 2013-06-28 (×7): 2 via ORAL
  Filled 2013-06-22 (×5): qty 2
  Filled 2013-06-22: qty 1
  Filled 2013-06-22 (×3): qty 2

## 2013-06-22 MED ORDER — PSYLLIUM 95 % PO PACK
1.0000 | PACK | Freq: Every day | ORAL | Status: DC
  Administered 2013-06-22 – 2013-06-23 (×2): 1 via ORAL
  Filled 2013-06-22: qty 1

## 2013-06-22 NOTE — Progress Notes (Signed)
NUTRITION FOLLOW UP  Intervention:    1. Continue to encourage intake at meals.  2. Reviewed low sodium and low phosphorus diet guidelines with pt. Handout with contact information provided.   Nutrition Dx:   Inadequate oral intake related to medical condition as evidenced by NPO status; resolved.   Goal:   Pt to meet >/= 90% of their estimated nutrition needs; not being met  Monitor:  PO intake, Weight, Labs  Assessment:   67 year old male with rectal cancer s/ neoadjuvant chemoRt with history of HTN, Gout, diverticulosis, chronic renal insufficiency, and anemia. Pt now 7 days s/p lap assisted LAR with coloanal anastomosis and diverting loop ileostomy. Pt with post-op ileus; PICC placed 11/20 and plan to start pt on TPN tonight. Pt has been NPO since 11/14 except for one day of clear liquids on 11/17. Pt being brought to CT scan at time of visit. Per MST report, pt denied recent weight loss and decreased appetite PTA.   11/28 - S/p NGT placed 11/22 with 2L output and total output that day of 5.8L. NGT out 11/25. Nephrology following. TPN cut in half yesterday. Low fiber diet started 11/27. Met with pt who reports eating 100% of meals today including some yogurt for breakfast and some beef and soup for lunch. Requesting a snack which was ordered. Interested in getting Ensure Complete.   11/29 - Had nausea and large amount of emesis. Changed to NPO.   11/30 - Pt c/o abdominal fullness. No reports of nausea. Pt refused NGT.   12/1 - Surgeon prefers to wait until pt's abdominal distention resolved before advancing diet. Plan is to continue TPN.   12/5 - NG tube in place but, unhooked from suction at time of visit. Per MD note pt having multiple liters of ileostomy and NG output.  TPN continues: Clinimix E5/15 at 83 ml/hr + 20% Lipids at 108ml/hr which provides 1798 kcal and 100 grams of protein. This meets 100% of estimated energy needs and 100% of estimated protein needs. Lipids continue at  reduced rate due to elevated triglycerides  12/8 Per chart acute on CKD due to volume depletion. Per MD note pt still having multiple liters of NG and ostomy output; bowel obstruction possibly resolving- will clamp tube and measure residuals today. Reduced protein needs slightly for now until kidney function returns to baseline.   12/12 NG tube removed, diet advanced and TPN at decreased rate. Currently Clinamix E 5/15 is running at 35 ml/hr with lipids at 6 ml/hr providing 884 kcal and 42 grams protein. Pt's diet advanced to Dysphagia 3/Renal this morning; pt about to order lunch at time of visit. Briefly discussed food restrictions on renal diet with pt. Pt reports his appetite is good; he denies any nausea or pain. Pt has lost 8 lbs since admission (4% weight loss).   - Triglycerides 165 12/14 - High Phosphorus - BUN/Cr elevated and trending down  12/17 TPN now off. PO intake 100% of meals. Phosphorus remains elevated. Consult received to review diet restrictions.   CBG (last 3)   Recent Labs  06/22/13 0001 06/22/13 0355 06/22/13 0737  GLUCAP 88 84 77     Height: Ht Readings from Last 1 Encounters:  06/20/13 5\' 7"  (1.702 m)    Weight Status:   Wt Readings from Last 1 Encounters:  06/22/13 196 lb 3.2 oz (88.996 kg)  Admit wt:        199 lb (90.26 kg)   Re-estimated needs:  Kcal: 1800-  2000  Protein: 52-70 grams  Fluid: 2.3-2.5 L/day   Skin: Rectal and abdominal incision, facial edema  Diet Order: Renal Meal Completion: 100%   Intake/Output Summary (Last 24 hours) at 06/22/13 1104 Last data filed at 06/22/13 1000  Gross per 24 hour  Intake   6130 ml  Output   3500 ml  Net   2630 ml    Last BM: ileostomy 250 ml 12/16 500 ml 12/15  Labs:   Recent Labs Lab 06/16/13 0330  06/20/13 0445 06/21/13 0615 06/22/13 0545  NA 138  134*  < > 133* 135 138  K 4.5  4.3  < > 4.2 4.1 4.1  CL 92*  90*  < > 96 98 102  CO2 33*  33*  < > 22 21 21   BUN 86*  86*  <  > 81* 77* 72*  CREATININE 8.40*  8.40*  < > 9.82* 9.11* 8.28*  CALCIUM 9.9  9.6  < > 8.9 9.3 9.4  MG 1.7  --  1.4*  --   --   PHOS 6.3*  6.2*  < > 6.1* 5.7* 5.6*  GLUCOSE 125*  124*  < > 102* 87 91  < > = values in this interval not displayed.  CBG (last 3)   Recent Labs  06/22/13 0001 06/22/13 0355 06/22/13 0737  GLUCAP 88 84 77    Scheduled Meds: . carvedilol  6.25 mg Oral BID WC  . darbepoetin (ARANESP) injection - NON-DIALYSIS  100 mcg Subcutaneous Q Sun-1800  . heparin subcutaneous  5,000 Units Subcutaneous Q8H  . insulin aspart  0-15 Units Subcutaneous Q4H  . psyllium  1 packet Oral Daily  . sodium chloride  3 mL Intravenous Q12H    Continuous Infusions: . sodium chloride 75 mL/hr at 06/21/13 [place], [place], [place] Pager [phone] After Hours Pager

## 2013-06-22 NOTE — Progress Notes (Signed)
Patient ID: Jason [name], male   DOB: [birth year], 67 y.o.   MRN: [id number] S:pt feels good  O:BP 169/70  Pulse 97  Temp(Src) 98.2 F (36.8 C) (Oral)  Resp 18  Ht 5\' 7"  ([id number] m)  Wt 88.996 kg (196 lb 3.2 oz)  BMI 30.72 kg/m2  SpO2 98%  Intake/Output Summary (Last 24 hours) at 06/22/13 0958 Last data filed at 06/22/13 0900  Gross per 24 hour  Intake   6130 ml  Output   3300 ml  Net   2830 ml   Intake/Output: I/O last 3 completed shifts: In: [id number] [P.O.:1080; I.V.:5290] Out: 4900 [Urine:4400; Stool:500]  Intake/Output this shift:  Total I/O In: 240 [P.O.:240] Out: 450 [Urine:450] Weight change: 0 kg (0 lb) Gen:WD WN AAM in NAD CVS:no rub Resp:cta [id number], ostomy in RLQ Ext:no edema   Recent Labs Lab 06/16/13 0330 06/17/13 0345 06/18/13 0340 06/19/13 0500 06/20/13 0445 06/21/13 0615 06/22/13 0545  NA 138  134* 134* 132* 135 133* 135 138  K 4.5  4.3 4.2 4.0 3.9 4.2 4.1 4.1  CL 92*  90* 90* 92* 96 96 98 102  CO2 33*  33* 31 26 26 22 21 21   GLUCOSE 125*  124* 97 99 104* 102* 87 91  BUN 86*  86* 85* 81* 85* 81* 77* 72*  CREATININE 8.40*  8.40* 9.00* 9.21* 9.74* 9.82* 9.11* 8.28*  ALBUMIN 2.5*  2.5* 2.4* 2.4* 2.5* 2.3* 2.6* 2.5*  CALCIUM 9.9  9.6 9.1 9.0 9.2 8.9 9.3 9.4  PHOS 6.3*  6.2* 6.3* 6.2* 5.9* 6.1* 5.7* 5.6*  AST 12  --   --   --  11  --   --   ALT 13  --   --   --  12  --   --    Liver Function Tests:  Recent Labs Lab 06/16/13 0330  06/20/13 0445 06/21/13 0615 06/22/13 0545  AST 12  --  11  --   --   ALT 13  --  12  --   --   ALKPHOS 166*  --  134*  --   --   BILITOT 0.2*  --  0.2*  --   --   PROT 6.7  --  6.5  --   --   ALBUMIN 2.5*  2.5*  < > 2.3* 2.6* 2.5*  < > = values in this interval not displayed. No results found for this basename: LIPASE, AMYLASE,  in the last 168 hours No results found for this basename: AMMONIA,  in the last 168 hours CBC:  Recent Labs Lab 06/19/13 1125 06/20/13 0445  WBC 8.3 7.8  NEUTROABS  --   6.4  HGB 7.8* 7.8*  HCT 23.0* 22.4*  MCV 85.5 85.5  PLT 200 198   Cardiac Enzymes: No results found for this basename: CKTOTAL, CKMB, CKMBINDEX, TROPONINI,  in the last 168 hours CBG:  Recent Labs Lab 06/21/13 1610 06/21/13 2114 06/22/13 0001 06/22/13 0355 06/22/13 0737  GLUCAP 103* 107* 88 84 77    Iron Studies: No results found for this basename: IRON, TIBC, TRANSFERRIN, FERRITIN,  in the last 72 hours Studies/Results: No results found. . carvedilol  6.25 mg Oral BID WC  . darbepoetin (ARANESP) injection - NON-DIALYSIS  100 mcg Subcutaneous Q Sun-1800  . heparin subcutaneous  5,000 Units Subcutaneous Q8H  . insulin aspart  0-15 Units Subcutaneous Q4H  . psyllium  1 packet Oral Daily  . sodium chloride  3 mL Intravenous Q12H  BMET    Component Value Date/Time   NA 138 06/22/2013 0545   NA 143 04/28/2013 0859   K 4.1 06/22/2013 0545   K 3.7 04/28/2013 0859   CL 102 06/22/2013 0545   CL 101 12/09/2012 1048   CO2 21 06/22/2013 0545   CO2 25 04/28/2013 0859   GLUCOSE 91 06/22/2013 0545   GLUCOSE 104 04/28/2013 0859   GLUCOSE 96 12/09/2012 1048   GLUCOSE 114* 05/11/2006 1014   BUN 72* 06/22/2013 0545   BUN 18.5 04/28/2013 0859   CREATININE 8.28* 06/22/2013 0545   CREATININE 1.8* 04/28/2013 0859   CALCIUM 9.4 06/22/2013 0545   CALCIUM 10.1 04/28/2013 0859   GFRNONAA 6* 06/22/2013 0545   GFRAA 7* 06/22/2013 0545   CBC    Component Value Date/Time   WBC 7.8 06/20/2013 0445   WBC 5.2 04/25/2013 0910   RBC 2.62* 06/20/2013 0445   RBC 3.73* 04/25/2013 0910   RBC 4.10* 12/06/2012 1643   HGB 7.8* 06/20/2013 0445   HGB 10.4* 04/25/2013 0910   HGB 10.0* 12/06/2012 1624   HCT 22.4* 06/20/2013 0445   HCT 32.3* 04/25/2013 0910   PLT 198 06/20/2013 0445   PLT 198 04/25/2013 0910   MCV 85.5 06/20/2013 0445   MCV 86.6 04/25/2013 0910   MCH 29.8 06/20/2013 0445   MCH 27.9 04/25/2013 0910   MCHC 34.8 06/20/2013 0445   MCHC 32.2 04/25/2013 0910   RDW 14.3 06/20/2013 0445    RDW 21.4* 04/25/2013 0910   LYMPHSABS 0.7 06/20/2013 0445   LYMPHSABS 0.4* 04/25/2013 0910   MONOABS 0.6 06/20/2013 0445   MONOABS 0.9 04/25/2013 0910   EOSABS 0.1 06/20/2013 0445   EOSABS 0.1 04/25/2013 0910   BASOSABS 0.0 06/20/2013 0445   BASOSABS 0.0 04/25/2013 0910     Assessment/Plan:  1. AKI/CKD- non-oliguric. (CKD stage 3 at baseline with Scr of 1.8-2.1) Initially felt to be hemodynamically mediate with high output from ostomy and hypotension. Initially responded to IVF's and now worsening without clear nephrotoxic agents.  1. Has now had 2 consecutive days with improved Scr. Cont with IVF's for now. No indication for HD. Unclear if there were nephrotoxic agents used for chemo or related to XRT. 2. HTN- not at goal. Would recommend increasing coreg to 6.25mg  and cont to titrate as needed. Will incease today if ok with primary team 3. ACDz/malignancy- on epo 4. DM- per primary svc 5. Protein malnutrition- on TPN 6. Colorectal CA- s/p XRT and neoadjuvant chemo. C/b high output ostomy. 7. Hyperphosphatemia- improving 8.   [place] A

## 2013-06-22 NOTE — Progress Notes (Signed)
33 Days Post-Op  Subjective: Feels "great".  Denies nausea or difficulty with diet.  Excellent UOP.  Ostomy output <1L  Objective: Vital signs in last 24 hours: Temp:  [98 F (36.7 C)-98.6 F (37 C)] 98 F (36.7 C) (12/17 0412) Pulse Rate:  [62-95] 62 (12/17 0412) Resp:  [16-18] 16 (12/17 0412) BP: (135-158)/(65-95) 158/65 mmHg (12/17 0412) SpO2:  [96 %-98 %] 97 % (12/17 0412) Weight:  [196 lb 3.2 oz (88.996 kg)] 196 lb 3.2 oz (88.996 kg) (12/17 0412) Last BM Date: 06/19/13  Intake/Output from previous day: 12/16 0701 - 12/17 0700 In: 6130 [P.O.:840; I.V.:5290] Out: 3100 [Urine:2800; Stool:300] Intake/Output this shift:   General appearance: alert, cooperative and no distress Resp: nonlabored GI: soft, NT, min distention, no peritoneal signs.  ostomy with thick bilious output.   Lab Results:   Recent Labs  06/19/13 1125 06/20/13 0445  WBC 8.3 7.8  HGB 7.8* 7.8*  HCT 23.0* 22.4*  PLT 200 198   BMET  Recent Labs  06/21/13 0615 06/22/13 0545  NA 135 138  K 4.1 4.1  CL 98 102  CO2 21 21  GLUCOSE 87 91  BUN 77* 72*  CREATININE 9.11* 8.28*  CALCIUM 9.3 9.4   PT/INR No results found for this basename: LABPROT, INR,  in the last 72 hours ABG No results found for this basename: PHART, PCO2, PO2, HCO3,  in the last 72 hours  Studies/Results: No results found.  Anti-infectives: Anti-infectives   Start     Dose/Rate Route Frequency Ordered Stop   06/05/13 1400  piperacillin-tazobactam (ZOSYN) IVPB 3.375 g  Status:  Discontinued     3.375 g 12.5 mL/hr over 240 Minutes Intravenous 3 times per day 06/05/13 1159 06/10/13 0815   06/01/13 1600  piperacillin-tazobactam (ZOSYN) IVPB 2.25 g  Status:  Discontinued     2.25 g 100 mL/hr over 30 Minutes Intravenous 4 times per day 06/01/13 1024 06/05/13 1159   05/31/13 1300  piperacillin-tazobactam (ZOSYN) IVPB 3.375 g  Status:  Discontinued     3.375 g 12.5 mL/hr over 240 Minutes Intravenous 3 times per day 05/31/13  1212 06/01/13 1025   05/21/13 0200  cefoTEtan (CEFOTAN) 2 g in dextrose 5 % 50 mL IVPB     2 g 100 mL/hr over 30 Minutes Intravenous Every 12 hours 05/20/13 1808 05/21/13 0203   05/20/13 0551  cefoTEtan (CEFOTAN) 2 g in dextrose 5 % 50 mL IVPB     2 g 100 mL/hr over 30 Minutes Intravenous On call to O.R. 05/20/13 0551 05/20/13 1345      Assessment/Plan: s/p Procedure(s): LAPAROSCOPIC LOW ANTERIOR RESECTION WITH SPLENIC FLEXURE MOBILIZATION (N/A) diverting OSTOMY (N/A) S/P prolonged ileus and early post op bowel obstruction: SBFT showed resolution. Cont renal diet today. Encouraged pt to eat frequent small meals.  TPN off.  Consulted nutrition to educate pt and his wife on renal diet HTN: Coreg dose titrated yesterday ARF: Etiology seems to be ATN per Nephrology.  UOP much better.  IVF's per nephrology, appreciate assistance.  Monitor UOP closely.  Cont foley per Nephrology to monitor UOP closely.  Hopefully can get this out soon now that Cr starting to go down  Cont ambulation, SCD's, Inc spirometry   LOS: 33 days    Sheranda Seabrooks C. [id number]

## 2013-06-23 ENCOUNTER — Telehealth (INDEPENDENT_AMBULATORY_CARE_PROVIDER_SITE_OTHER): Payer: Self-pay | Admitting: General Surgery

## 2013-06-23 LAB — RENAL FUNCTION PANEL
Albumin: 2.6 g/dL — ABNORMAL LOW (ref 3.5–5.2)
CO2: 22 mEq/L (ref 19–32)
Chloride: 103 mEq/L (ref 96–112)
GFR calc Af Amer: 9 mL/min — ABNORMAL LOW (ref >90)
GFR calc non Af Amer: 8 mL/min — ABNORMAL LOW (ref >90)
Glucose, Bld: 87 mg/dL (ref 70–99)
Sodium: 137 mEq/L (ref 135–145)

## 2013-06-23 LAB — GLUCOSE, CAPILLARY
Glucose-Capillary: 86 mg/dL (ref 70–99)
Glucose-Capillary: 94 mg/dL (ref 70–99)
Glucose-Capillary: 98 mg/dL (ref 70–99)

## 2013-06-23 MED ORDER — LOPERAMIDE HCL 2 MG PO CAPS
2.0000 mg | ORAL_CAPSULE | Freq: Three times a day (TID) | ORAL | Status: DC
Start: 2013-06-23 — End: 2013-06-28
  Administered 2013-06-23 – 2013-06-28 (×14): 2 mg via ORAL
  Filled 2013-06-23 (×18): qty 1

## 2013-06-23 NOTE — Progress Notes (Signed)
Patient ID: GENEVA LEAVELLE, male   DOB: 1945/08/26, 67 y.o.   MRN: [id number] S:Feels good O:BP 144/80  Pulse 86  Temp(Src) 98.2 F (36.8 C) (Oral)  Resp 18  Ht 5\' 7"  (1.702 m)  Wt 88.996 kg (196 lb 3.2 oz)  BMI 30.72 kg/m2  SpO2 98%  Intake/Output Summary (Last 24 hours) at 06/23/13 1112 Last data filed at 06/23/13 0959  Gross per 24 hour  Intake 3687.5 ml  Output   3050 ml  Net  637.5 ml   Intake/Output: I/O last 3 completed shifts: In: 3807.5 [P.O.:960; I.V.:2847.5] Out: 4725 [Urine:3125; [id number]  Intake/Output this shift:  Total I/O In: 240 [P.O.:240] Out: 625 [Urine:400; Stool:225] Weight change: 0 kg (0 lb) Gen:WD WN AAM in NAD CVS:no rub Resp:cta [id number], ostomy in RLQ Ext:no edema   Recent Labs Lab 06/17/13 0345 06/18/13 0340 06/19/13 0500 06/20/13 0445 06/21/13 0615 06/22/13 0545 06/23/13 0606  NA 134* 132* 135 133* 135 138 137  K 4.2 4.0 3.9 4.2 4.1 4.1 3.9  CL 90* 92* 96 96 98 102 103  CO2 31 26 26 22 21 21 22   GLUCOSE 97 99 104* 102* 87 91 87  BUN 85* 81* 85* 81* 77* 72* 63*  CREATININE 9.00* 9.21* 9.74* 9.82* 9.11* 8.28* 6.60*  ALBUMIN 2.4* 2.4* 2.5* 2.3* 2.6* 2.5* 2.6*  CALCIUM 9.1 9.0 9.2 8.9 9.3 9.4 9.4  PHOS 6.3* 6.2* 5.9* 6.1* 5.7* 5.6* 4.5  AST  --   --   --  11  --   --   --   ALT  --   --   --  12  --   --   --    Liver Function Tests:  Recent Labs Lab 06/20/13 0445 06/21/13 0615 06/22/13 0545 06/23/13 0606  AST 11  --   --   --   ALT 12  --   --   --   ALKPHOS 134*  --   --   --   BILITOT 0.2*  --   --   --   PROT 6.5  --   --   --   ALBUMIN 2.3* 2.6* 2.5* 2.6*   No results found for this basename: LIPASE, AMYLASE,  in the last 168 hours No results found for this basename: AMMONIA,  in the last 168 hours CBC:  Recent Labs Lab 06/19/13 1125 06/20/13 0445  WBC 8.3 7.8  NEUTROABS  --  6.4  HGB 7.8* 7.8*  HCT 23.0* 22.4*  MCV 85.5 85.5  PLT 200 198   Cardiac Enzymes: No results found for this basename:  CKTOTAL, CKMB, CKMBINDEX, TROPONINI,  in the last 168 hours CBG:  Recent Labs Lab 06/22/13 0737 06/22/13 1142 06/22/13 1626 06/22/13 2016 06/23/13 0747  GLUCAP 77 86 92 93 86    Iron Studies: No results found for this basename: IRON, TIBC, TRANSFERRIN, FERRITIN,  in the last 72 hours Studies/Results: No results found. . carvedilol  6.25 mg Oral BID WC  . darbepoetin (ARANESP) injection - NON-DIALYSIS  100 mcg Subcutaneous Q Sun-1800  . heparin subcutaneous  5,000 Units Subcutaneous Q8H  . loperamide  2 mg Oral TID AC  . sodium chloride  3 mL Intravenous Q12H    BMET    Component Value Date/Time   NA 137 06/23/2013 0606   NA 143 04/28/2013 0859   K 3.9 06/23/2013 0606   K 3.7 04/28/2013 0859   CL 103 06/23/2013 0606   CL 101 12/09/2012 1048  CO2 22 06/23/2013 0606   CO2 25 04/28/2013 0859   GLUCOSE 87 06/23/2013 0606   GLUCOSE 104 04/28/2013 0859   GLUCOSE 96 12/09/2012 1048   GLUCOSE 114* 05/11/2006 1014   BUN 63* 06/23/2013 0606   BUN 18.5 04/28/2013 0859   CREATININE 6.60* 06/23/2013 0606   CREATININE 1.8* 04/28/2013 0859   CALCIUM 9.4 06/23/2013 0606   CALCIUM 10.1 04/28/2013 0859   GFRNONAA 8* 06/23/2013 0606   GFRAA 9* 06/23/2013 0606   CBC    Component Value Date/Time   WBC 7.8 06/20/2013 0445   WBC 5.2 04/25/2013 0910   RBC 2.62* 06/20/2013 0445   RBC 3.73* 04/25/2013 0910   RBC 4.10* 12/06/2012 1643   HGB 7.8* 06/20/2013 0445   HGB 10.4* 04/25/2013 0910   HGB 10.0* 12/06/2012 1624   HCT 22.4* 06/20/2013 0445   HCT 32.3* 04/25/2013 0910   PLT 198 06/20/2013 0445   PLT 198 04/25/2013 0910   MCV 85.5 06/20/2013 0445   MCV 86.6 04/25/2013 0910   MCH 29.8 06/20/2013 0445   MCH 27.9 04/25/2013 0910   MCHC 34.8 06/20/2013 0445   MCHC 32.2 04/25/2013 0910   RDW 14.3 06/20/2013 0445   RDW 21.4* 04/25/2013 0910   LYMPHSABS 0.7 06/20/2013 0445   LYMPHSABS 0.4* 04/25/2013 0910   MONOABS 0.6 06/20/2013 0445   MONOABS 0.9 04/25/2013 0910   EOSABS 0.1  06/20/2013 0445   EOSABS 0.1 04/25/2013 0910   BASOSABS 0.0 06/20/2013 0445   BASOSABS 0.0 04/25/2013 0910     Assessment/Plan:  1. AKI/CKD- non-oliguric. (CKD stage 3 at baseline with Scr of 1.8-2.1) Initially felt to be hemodynamically mediate with high output from ostomy and hypotension. Initially responded to IVF's and now worsening without clear nephrotoxic agents.  1. Marked drop in Scr over the last 24 hours.  2. Cont with IVF's for now as he is continuing to have high output from ostomy.  3. No indication for HD. Unclear if there were nephrotoxic agents used for chemo or related to XRT. 4. Will need to make sure he is able to keep up with output before he can be discharged to home 2. HTN- not at goal. Improved after increasing coreg to 6.25mg  and cont to titrate as needed.  3. ACDz/malignancy- on epo consider transfusion per primary team 4. DM- per primary svc 5. Protein malnutrition- on TPN 6. Colorectal CA- s/p XRT and neoadjuvant chemo. C/b high output ostomy. 7. Hyperphosphatemia- improving 8.   [place] A

## 2013-06-23 NOTE — Telephone Encounter (Signed)
Discussed care of patient with wife.

## 2013-06-23 NOTE — Progress Notes (Signed)
Chaplain provided ministry of presence. Chaplain offered emotional and spiritual support. Patient had not pastoral needs. Chaplain will follow up if requested.    06/23/13 1100  Clinical Encounter Type  Visited With Patient and family together  Visit Type Initial;Social support

## 2013-06-23 NOTE — Progress Notes (Signed)
34 Days Post-Op  Subjective: Feels "great".  Denies nausea or difficulty with diet.  Excellent UOP.  Ostomy output <1L  Objective: Vital signs in last 24 hours: Temp:  [97.8 F (36.6 C)-98.7 F (37.1 C)] 98.2 F (36.8 C) (12/18 1000) Pulse Rate:  [76-91] 86 (12/18 1000) Resp:  [18] 18 (12/18 1000) BP: (132-147)/(80-88) 144/80 mmHg (12/18 1000) SpO2:  [96 %-98 %] 98 % (12/18 1000) Weight:  [196 lb 3.2 oz (88.996 kg)] 196 lb 3.2 oz (88.996 kg) (12/17 2020) Last BM Date: 06/23/13  Intake/Output from previous day: 12/17 0701 - 12/18 0700 In: 3687.5 [P.O.:840; I.V.:2847.5] Out: [id number] [Urine:1525; Stool:1550] Intake/Output this shift: Total I/O In: 240 [P.O.:240] Out: 625 [Urine:400; Stool:225] General appearance: alert, cooperative and no distress Resp: nonlabored GI: soft, NT, min distention, no peritoneal signs.  ostomy with thick bilious output.   Lab Results:  No results found for this basename: WBC, HGB, HCT, PLT,  in the last 72 hours BMET  Recent Labs  06/22/13 0545 06/23/13 0606  NA 138 137  K 4.1 3.9  CL 102 103  CO2 21 22  GLUCOSE 91 87  BUN 72* 63*  CREATININE 8.28* 6.60*  CALCIUM 9.4 9.4   PT/INR No results found for this basename: LABPROT, INR,  in the last 72 hours ABG No results found for this basename: PHART, PCO2, PO2, HCO3,  in the last 72 hours  Studies/Results: No results found.  Anti-infectives: Anti-infectives   Start     Dose/Rate Route Frequency Ordered Stop   06/05/13 1400  piperacillin-tazobactam (ZOSYN) IVPB 3.375 g  Status:  Discontinued     3.375 g 12.5 mL/hr over 240 Minutes Intravenous 3 times per day 06/05/13 1159 06/10/13 0815   06/01/13 1600  piperacillin-tazobactam (ZOSYN) IVPB 2.25 g  Status:  Discontinued     2.25 g 100 mL/hr over 30 Minutes Intravenous 4 times per day 06/01/13 1024 06/05/13 1159   05/31/13 1300  piperacillin-tazobactam (ZOSYN) IVPB 3.375 g  Status:  Discontinued     3.375 g 12.5 mL/hr over 240 Minutes  Intravenous 3 times per day 05/31/13 1212 06/01/13 1025   05/21/13 0200  cefoTEtan (CEFOTAN) 2 g in dextrose 5 % 50 mL IVPB     2 g 100 mL/hr over 30 Minutes Intravenous Every 12 hours 05/20/13 1808 05/21/13 0203   05/20/13 0551  cefoTEtan (CEFOTAN) 2 g in dextrose 5 % 50 mL IVPB     2 g 100 mL/hr over 30 Minutes Intravenous On call to O.R. 05/20/13 0551 05/20/13 1345      Assessment/Plan: s/p Procedure(s): LAPAROSCOPIC LOW ANTERIOR RESECTION WITH SPLENIC FLEXURE MOBILIZATION (N/A) diverting OSTOMY (N/A) S/P prolonged ileus and early post op bowel obstruction: SBFT showed resolution. Cont renal diet today.  Ostomy output 1.5L yesterday: will switch to imodium before meals HTN: Coreg dose titrated per nephrology ARF: Etiology seems to be ATN per Nephrology.  Cr decreasing.  IVF's per nephrology, appreciate assistance.  Monitor UOP closely.  Cont foley per Nephrology to monitor UOP closely.  Hopefully can get this out soon now that Cr starting to go down  Cont ambulation, SCD's, Inc spirometry   LOS: 34 days    Rosea Dory C. [id number]

## 2013-06-24 LAB — RENAL FUNCTION PANEL
Albumin: 2.6 g/dL — ABNORMAL LOW (ref 3.5–5.2)
BUN: 47 mg/dL — ABNORMAL HIGH (ref 6–23)
CO2: 22 mEq/L (ref 19–32)
Calcium: 9.4 mg/dL (ref 8.4–10.5)
Creatinine, Ser: 4.66 mg/dL — ABNORMAL HIGH (ref 0.50–1.35)
GFR calc Af Amer: 14 mL/min — ABNORMAL LOW (ref >90)
GFR calc non Af Amer: 12 mL/min — ABNORMAL LOW (ref >90)
Phosphorus: 3.9 mg/dL (ref 2.3–4.6)
Sodium: 138 mEq/L (ref 135–145)

## 2013-06-24 LAB — CBC
Hemoglobin: 8.1 g/dL — ABNORMAL LOW (ref 13.0–17.0)
MCH: 28.5 pg (ref 26.0–34.0)
MCHC: 32.7 g/dL (ref 30.0–36.0)
Platelets: 272 10*3/uL (ref 150–400)
RDW: 14.7 % (ref 11.5–15.5)

## 2013-06-24 LAB — GLUCOSE, CAPILLARY: Glucose-Capillary: 79 mg/dL (ref 70–99)

## 2013-06-24 NOTE — Progress Notes (Signed)
35 Days Post-Op  Subjective: Having some nausea this am.  Ostomy output at goal  Objective: Vital signs in last 24 hours: Temp:  [97.8 F (36.6 C)-99 F (37.2 C)] 98.8 F (37.1 C) (12/19 0510) Pulse Rate:  [62-91] 87 (12/19 0510) Resp:  [18] 18 (12/19 0510) BP: (132-151)/(70-87) 151/87 mmHg (12/19 0510) SpO2:  [96 %-100 %] 100 % (12/19 0510) Weight:  [194 lb 0.1 oz (88 kg)] 194 lb 0.1 oz (88 kg) (12/18 2147) Last BM Date: 06/24/13  Intake/Output from previous day: 12/18 0701 - 12/19 0700 In: 1610 [P.O.:960; I.V.:650] Out: 3750 [Urine:2475; [id number] Intake/Output this shift:   General appearance: alert, cooperative and no distress Resp: nonlabored GI: soft, NT, min distention, no peritoneal signs.  ostomy with thick bilious output.   Lab Results:   Recent Labs  06/24/13 0551  WBC 10.6*  HGB 8.1*  HCT 24.8*  PLT 272   BMET  Recent Labs  06/23/13 0606 06/24/13 0551  NA 137 138  K 3.9 3.8  CL 103 105  CO2 22 22  GLUCOSE 87 87  BUN 63* 47*  CREATININE 6.60* 4.66*  CALCIUM 9.4 9.4   PT/INR No results found for this basename: LABPROT, INR,  in the last 72 hours ABG No results found for this basename: PHART, PCO2, PO2, HCO3,  in the last 72 hours  Studies/Results: No results found.  Anti-infectives: Anti-infectives   Start     Dose/Rate Route Frequency Ordered Stop   06/05/13 1400  piperacillin-tazobactam (ZOSYN) IVPB 3.375 g  Status:  Discontinued     3.375 g 12.5 mL/hr over 240 Minutes Intravenous 3 times per day 06/05/13 1159 06/10/13 0815   06/01/13 1600  piperacillin-tazobactam (ZOSYN) IVPB 2.25 g  Status:  Discontinued     2.25 g 100 mL/hr over 30 Minutes Intravenous 4 times per day 06/01/13 1024 06/05/13 1159   05/31/13 1300  piperacillin-tazobactam (ZOSYN) IVPB 3.375 g  Status:  Discontinued     3.375 g 12.5 mL/hr over 240 Minutes Intravenous 3 times per day 05/31/13 1212 06/01/13 1025   05/21/13 0200  cefoTEtan (CEFOTAN) 2 g in dextrose 5 %  50 mL IVPB     2 g 100 mL/hr over 30 Minutes Intravenous Every 12 hours 05/20/13 1808 05/21/13 0203   05/20/13 0551  cefoTEtan (CEFOTAN) 2 g in dextrose 5 % 50 mL IVPB     2 g 100 mL/hr over 30 Minutes Intravenous On call to O.R. 05/20/13 0551 05/20/13 1345      Assessment/Plan: s/p Procedure(s): LAPAROSCOPIC LOW ANTERIOR RESECTION WITH SPLENIC FLEXURE MOBILIZATION (N/A) diverting OSTOMY (N/A) S/P prolonged ileus and early post op bowel obstruction: SBFT showed resolution. Cont renal diet today.  Ostomy output better yesterday on imodium.  Encouraged pt to drink plenty of fluids. HTN: Coreg dose titrated per nephrology ARF: Etiology seems to be ATN per Nephrology.  Cr decreasing.  IVF's per nephrology, appreciate assistance.  Monitor UOP Dispo: Hopefully home early next week  Cont ambulation, SCD's, Inc spirometry   LOS: 35 days    Argie Applegate C. [id number]

## 2013-06-24 NOTE — Progress Notes (Signed)
Patient ID: GARELD BLAES, male   DOB: October 28, 1945, 67 y.o.   MRN: [id number] S:Feels good O:BP 151/87  Pulse 87  Temp(Src) 98.8 F (37.1 C) (Oral)  Resp 18  Ht 5\' 7"  (1.702 m)  Wt 88 kg (194 lb 0.1 oz)  BMI 30.38 kg/m2  SpO2 100%  Intake/Output Summary (Last 24 hours) at 06/24/13 1214 Last data filed at 06/24/13 [id number]  Gross per 24 hour  Intake    975 ml  Output   3125 ml  Net  -2150 ml   Intake/Output: I/O last 3 completed shifts: In: 4817.5 [P.O.:1320; I.V.:3497.5] Out: 5275 [Urine:3000; Stool:2275]  Intake/Output this shift:    Weight change: -0.996 kg (-2 lb 3.1 oz) Gen:WD WN AAM in NAD CVS:no rub Resp:cta Abd:+BS, soft, ostomy RUQ Ext:no edema   Recent Labs Lab 06/18/13 0340 06/19/13 0500 06/20/13 0445 06/21/13 0615 06/22/13 0545 06/23/13 0606 06/24/13 0551  NA 132* 135 133* 135 138 137 138  K 4.0 3.9 4.2 4.1 4.1 3.9 3.8  CL 92* 96 96 98 102 103 105  CO2 26 26 22 21 21 22 22   GLUCOSE 99 104* 102* 87 91 87 87  BUN 81* 85* 81* 77* 72* 63* 47*  CREATININE 9.21* 9.74* 9.82* 9.11* 8.28* 6.60* 4.66*  ALBUMIN 2.4* 2.5* 2.3* 2.6* 2.5* 2.6* 2.6*  CALCIUM 9.0 9.2 8.9 9.3 9.4 9.4 9.4  PHOS 6.2* 5.9* 6.1* 5.7* 5.6* 4.5 3.9  AST  --   --  11  --   --   --   --   ALT  --   --  12  --   --   --   --    Liver Function Tests:  Recent Labs Lab 06/20/13 0445  06/22/13 0545 06/23/13 0606 06/24/13 0551  AST 11  --   --   --   --   ALT 12  --   --   --   --   ALKPHOS 134*  --   --   --   --   BILITOT 0.2*  --   --   --   --   PROT 6.5  --   --   --   --   ALBUMIN 2.3*  < > 2.5* 2.6* 2.6*  < > = values in this interval not displayed. No results found for this basename: LIPASE, AMYLASE,  in the last 168 hours No results found for this basename: AMMONIA,  in the last 168 hours CBC:  Recent Labs Lab 06/19/13 1125 06/20/13 0445 06/24/13 0551  WBC 8.3 7.8 10.6*  NEUTROABS  --  6.4  --   HGB 7.8* 7.8* 8.1*  HCT 23.0* 22.4* 24.8*  MCV 85.5 85.5 87.3  PLT 200 198  272   Cardiac Enzymes: No results found for this basename: CKTOTAL, CKMB, CKMBINDEX, TROPONINI,  in the last 168 hours CBG:  Recent Labs Lab 06/23/13 0747 06/23/13 1135 06/23/13 1639 06/23/13 2140 06/24/13 0802  GLUCAP 86 94 98 91 79    Iron Studies: No results found for this basename: IRON, TIBC, TRANSFERRIN, FERRITIN,  in the last 72 hours Studies/Results: No results found. . carvedilol  6.25 mg Oral BID WC  . darbepoetin (ARANESP) injection - NON-DIALYSIS  100 mcg Subcutaneous Q Sun-1800  . heparin subcutaneous  5,000 Units Subcutaneous Q8H  . loperamide  2 mg Oral TID AC  . sodium chloride  3 mL Intravenous Q12H    BMET    Component Value Date/Time  NA 138 06/24/2013 0551   NA 143 04/28/2013 0859   K 3.8 06/24/2013 0551   K 3.7 04/28/2013 0859   CL 105 06/24/2013 0551   CL 101 12/09/2012 1048   CO2 22 06/24/2013 0551   CO2 25 04/28/2013 0859   GLUCOSE 87 06/24/2013 0551   GLUCOSE 104 04/28/2013 0859   GLUCOSE 96 12/09/2012 1048   GLUCOSE 114* 05/11/2006 1014   BUN 47* 06/24/2013 0551   BUN 18.5 04/28/2013 0859   CREATININE 4.66* 06/24/2013 0551   CREATININE 1.8* 04/28/2013 0859   CALCIUM 9.4 06/24/2013 0551   CALCIUM 10.1 04/28/2013 0859   GFRNONAA 12* 06/24/2013 0551   GFRAA 14* 06/24/2013 0551   CBC    Component Value Date/Time   WBC 10.6* 06/24/2013 0551   WBC 5.2 04/25/2013 0910   RBC 2.84* 06/24/2013 0551   RBC 3.73* 04/25/2013 0910   RBC 4.10* 12/06/2012 1643   HGB 8.1* 06/24/2013 0551   HGB 10.4* 04/25/2013 0910   HGB 10.0* 12/06/2012 1624   HCT 24.8* 06/24/2013 0551   HCT 32.3* 04/25/2013 0910   PLT 272 06/24/2013 0551   PLT 198 04/25/2013 0910   MCV 87.3 06/24/2013 0551   MCV 86.6 04/25/2013 0910   MCH 28.5 06/24/2013 0551   MCH 27.9 04/25/2013 0910   MCHC 32.7 06/24/2013 0551   MCHC 32.2 04/25/2013 0910   RDW 14.7 06/24/2013 0551   RDW 21.4* 04/25/2013 0910   LYMPHSABS 0.7 06/20/2013 0445   LYMPHSABS 0.4* 04/25/2013 0910   MONOABS 0.6  06/20/2013 0445   MONOABS 0.9 04/25/2013 0910   EOSABS 0.1 06/20/2013 0445   EOSABS 0.1 04/25/2013 0910   BASOSABS 0.0 06/20/2013 0445   BASOSABS 0.0 04/25/2013 0910     Assessment/Plan:  1. AKI/CKD- non-oliguric. (CKD stage 3 at baseline with Scr of 1.8-2.1) Initially felt to be hemodynamically mediate with high output from ostomy and hypotension. Initially responded to IVF's and now worsening without clear nephrotoxic agents.  1. Continue to have marked improvement in Scr over the last 3 days.  2. Recommend stopping IVF's today and see if he can keep up with ostomy output on his own.  Will need to see continued improvement of Scr on po's before able to d/c to home.    2. HTN- not at goal. Improved after increasing coreg to 6.25mg  and cont to titrate as needed.  3. ACDz/malignancy- on epo consider transfusion per primary team 4. DM- per primary svc 5. Protein malnutrition- on TPN 6. Colorectal CA- s/p XRT and neoadjuvant chemo. C/b high output ostomy. 7. Hyperphosphatemia- improving 8. Will sign off for now and follow peripherally.  Will schedule hospital follow up with Dr. Posey Pronto at our office on January 14th at [place].  Call with questions or concerns.   [place] A

## 2013-06-25 LAB — RENAL FUNCTION PANEL
Albumin: 2.7 g/dL — ABNORMAL LOW (ref 3.5–5.2)
Chloride: 104 mEq/L (ref 96–112)
GFR calc Af Amer: 19 mL/min — ABNORMAL LOW (ref >90)
Glucose, Bld: 99 mg/dL (ref 70–99)
Phosphorus: 3.9 mg/dL (ref 2.3–4.6)
Potassium: 3.6 mEq/L (ref 3.5–5.1)
Sodium: 138 mEq/L (ref 135–145)

## 2013-06-25 LAB — GLUCOSE, CAPILLARY: Glucose-Capillary: 88 mg/dL (ref 70–99)

## 2013-06-25 NOTE — Progress Notes (Signed)
36 Days Post-Op  Subjective: He says he feels fine. On renal diet.  Off IV fluids, he says he is walking in the halls.  Objective: Vital signs in last 24 hours: Temp:  [98 F (36.7 C)-98.5 F (36.9 C)] 98 F (36.7 C) (12/19 1800) Pulse Rate:  [86-92] 86 (12/19 1800) Resp:  [20] 20 (12/19 1800) BP: (122-129)/(75-81) 129/75 mmHg (12/19 1800) SpO2:  [98 %-100 %] 100 % (12/19 1800) Last BM Date: 06/25/13 960 PO 845 per ostomy 1725 urine Weights last 4 days: Intake/Output from previous day: 88.9, 88.9, 88.9, 88 last is on 12/18, no weight last 2 days Creatinine is down to 3.58 from high of 9.82  12/19 0701 - 12/20 0700 In: 960 [P.O.:960] Out: 2570 [Urine:1725; Stool:845] Intake/Output this shift: Total I/O In: -  Out: 200 [Stool:200]  General appearance: alert, cooperative and no distress Resp: clear to auscultation bilaterally GI: soft, non-tender; bowel sounds normal; no masses,  no organomegaly and ileostomy is working   Lab Results:   Recent Labs  06/24/13 0551  WBC 10.6*  HGB 8.1*  HCT 24.8*  PLT 272    BMET  Recent Labs  06/24/13 0551 06/25/13 0558  NA 138 138  K 3.8 3.6  CL 105 104  CO2 22 24  GLUCOSE 87 99  BUN 47* 35*  CREATININE 4.66* 3.58*  CALCIUM 9.4 9.4   PT/INR No results found for this basename: LABPROT, INR,  in the last 72 hours   Recent Labs Lab 06/20/13 0445 06/21/13 0615 06/22/13 0545 06/23/13 0606 06/24/13 0551 06/25/13 0558  AST 11  --   --   --   --   --   ALT 12  --   --   --   --   --   ALKPHOS 134*  --   --   --   --   --   BILITOT 0.2*  --   --   --   --   --   PROT 6.5  --   --   --   --   --   ALBUMIN 2.3* 2.6* 2.5* 2.6* 2.6* 2.7*     Lipase  No results found for this basename: lipase     Studies/Results: No results found.  Medications: . carvedilol  6.25 mg Oral BID WC  . darbepoetin (ARANESP) injection - NON-DIALYSIS  100 mcg Subcutaneous Q Sun-1800  . heparin subcutaneous  5,000 Units Subcutaneous  Q8H  . loperamide  2 mg Oral TID AC  . sodium chloride  3 mL Intravenous Q12H       1. Soft tissue, biopsy, circumferential margin - BENIGN SOFT TISSUE - NEGATIVE FOR MALIGNANCY. 2. Colon, segmental resection for tumor, distal rectum - INVASIVE ADENOCARCINOMA WITH NEOADJUVANT RELATED CHANGE, SEE COMMENT. - TUMOR INVADES INTO PERIRECTAL SOFT TISSUE. - NO LYMPHOVASCULAR OR PERINEURAL INVASION IS IDENTIFIED. - FOURTEEN LYMPH NODES, NEGATIVE FOR TUMOR (0/14). - HYPERPLASTIC POLYP (X1). - SURGICAL MARGINS, NEGATIVE FOR TUMOR - SEE TUMOR SYNOPTIC TEMPLATE BELOW. Microscopic Comment 2. COLON AND RECTUM (INCLUDING TRANS-ANAL RESECTION  1. Soft tissue, biopsy, circumferential margin - BENIGN SOFT TISSUE - NEGATIVE FOR MALIGNANCY. 2. Colon, segmental resection for tumor, distal rectum - INVASIVE ADENOCARCINOMA WITH NEOADJUVANT RELATED CHANGE, SEE COMMENT. - TUMOR INVADES INTO PERIRECTAL SOFT TISSUE. - NO LYMPHOVASCULAR OR PERINEURAL INVASION IS IDENTIFIED. - FOURTEEN LYMPH NODES, NEGATIVE FOR TUMOR (0/14). - HYPERPLASTIC POLYP (X1). - SURGICAL MARGINS, NEGATIVE FOR TUMOR - SEE TUMOR SYNOPTIC TEMPLATE BELOW. Microscopic Comment 2. COLON  AND RECTUM (INCLUDING TRANS-ANAL RESECTION Assessment/Plan LAPAROSCOPIC LOW ANTERIOR RESECTION WITH SPLENIC FLEXURE MOBILIZATION (N/A)  diverting OSTOMY (N/A) [id number], Leighton Ruff, MD S/P prolonged ileus and early post op bowel obstruction: SBFT showed resolution. AKI/CKD- non-oliguric. (CKD stage 3 at baseline with Scr of 1.8-2.1) Hypertension AODM Malnutrition     Plan:  Continue PO's off IV fluids, see if he can maintain adequate PO intake. On paper it wound not appear he is.  I have ask nursing staff to continue daily weights, none for the last 2 days. Recheck labs Monday.  LOS: 36 days    Morna Flud 06/25/2013

## 2013-06-25 NOTE — Progress Notes (Signed)
Had some nausea last night o/w ok Denies abd pain Ostomy around 900cc/24hrs  Alert, nad Soft, mild distension. Ostomy outpt - a little thick   Ostomy output is <1L per day which is good Cont anti-diarrheal Stressed to pt that for every bag that he empties he will need to drink 1 diluted gatorade in order to prevent dehydration Renal function improving  Leighton Ruff. Redmond Pulling, MD, FACS General, Bariatric, & Minimally Invasive Surgery [facility], [place]

## 2013-06-26 LAB — RENAL FUNCTION PANEL
Albumin: 2.6 g/dL — ABNORMAL LOW (ref 3.5–5.2)
Calcium: 9 mg/dL (ref 8.4–10.5)
Chloride: 99 mEq/L (ref 96–112)
Creatinine, Ser: 3.16 mg/dL — ABNORMAL HIGH (ref 0.50–1.35)
GFR calc non Af Amer: 19 mL/min — ABNORMAL LOW (ref >90)
Sodium: 135 mEq/L (ref 135–145)

## 2013-06-26 MED ORDER — PROMETHAZINE HCL 25 MG/ML IJ SOLN
12.5000 mg | Freq: Once | INTRAMUSCULAR | Status: AC
  Administered 2013-06-26: 12.5 mg via INTRAVENOUS
  Filled 2013-06-26: qty 1

## 2013-06-26 MED ORDER — POTASSIUM CHLORIDE CRYS ER 10 MEQ PO TBCR
10.0000 meq | EXTENDED_RELEASE_TABLET | Freq: Two times a day (BID) | ORAL | Status: AC
  Administered 2013-06-26 (×2): 10 meq via ORAL
  Filled 2013-06-26 (×2): qty 1

## 2013-06-26 MED ORDER — SODIUM CHLORIDE 0.9 % IV BOLUS (SEPSIS)
1000.0000 mL | Freq: Once | INTRAVENOUS | Status: AC
  Administered 2013-06-26: 1000 mL via INTRAVENOUS

## 2013-06-26 NOTE — Progress Notes (Signed)
37 Days Post-Op  Subjective: Complains of some abdominal pain around the ostomy, he says it comes and goes, better with pain medicine.  He has not had any today.  Objective: Vital signs in last 24 hours: Temp:  [97.8 F (36.6 C)-98.8 F (37.1 C)] 97.8 F (36.6 C) (12/21 0500) Pulse Rate:  [70-90] 70 (12/21 0500) Resp:  [18-20] 18 (12/21 0500) BP: (130-151)/(78-87) 130/78 mmHg (12/21 0500) SpO2:  [98 %-100 %] 99 % (12/21 0500) Weight:  [87.6 kg (193 lb 2 oz)] 87.6 kg (193 lb 2 oz) (12/20 1000) Last BM Date: 06/25/13 720 PO  2300 urine output and 1365 ileostomy output (- 2945 ml yesterday) Renal diet Weight yesterday 87.6 KG  88 Kg 12/18, and 88.9 Kg 06/22/13 Today's weight is still pending Creatinine is still improving, K+ is down Intake/Output from previous day: 12/20 0701 - 12/21 0700 In: 720 [P.O.:720] Out: 3665 [Urine:2300; [id number] Intake/Output this shift:    General appearance: alert, cooperative and no distress Resp: clear to auscultation bilaterally GI: soft, some discomfort around the ostomy bag site.  Ostomy looks fine thru bag.  stool coming from the ostomy.  + BS, incision well healed.   Lab Results:   Recent Labs  06/24/13 0551  WBC 10.6*  HGB 8.1*  HCT 24.8*  PLT 272    BMET  Recent Labs  06/25/13 0558 06/26/13 0453  NA 138 135  K 3.6 3.5  CL 104 99  CO2 24 23  GLUCOSE 99 90  BUN 35* 28*  CREATININE 3.58* 3.16*  CALCIUM 9.4 9.0   PT/INR No results found for this basename: LABPROT, INR,  in the last 72 hours   Recent Labs Lab 06/20/13 0445  06/22/13 0545 06/23/13 0606 06/24/13 0551 06/25/13 0558 06/26/13 0453  AST 11  --   --   --   --   --   --   ALT 12  --   --   --   --   --   --   ALKPHOS 134*  --   --   --   --   --   --   BILITOT 0.2*  --   --   --   --   --   --   PROT 6.5  --   --   --   --   --   --   ALBUMIN 2.3*  < > 2.5* 2.6* 2.6* 2.7* 2.6*  < > = values in this interval not displayed.   Lipase  No results  found for this basename: lipase     Studies/Results: No results found.  Medications: . carvedilol  6.25 mg Oral BID WC  . darbepoetin (ARANESP) injection - NON-DIALYSIS  100 mcg Subcutaneous Q Sun-1800  . heparin subcutaneous  5,000 Units Subcutaneous Q8H  . loperamide  2 mg Oral TID AC  . sodium chloride  3 mL Intravenous Q12H     1. Soft tissue, biopsy, circumferential margin  - BENIGN SOFT TISSUE  - NEGATIVE FOR MALIGNANCY.  2. Colon, segmental resection for tumor, distal rectum  - INVASIVE ADENOCARCINOMA WITH NEOADJUVANT RELATED CHANGE, SEE COMMENT.  - TUMOR INVADES INTO PERIRECTAL SOFT TISSUE.  - NO LYMPHOVASCULAR OR PERINEURAL INVASION IS IDENTIFIED.  - FOURTEEN LYMPH NODES, NEGATIVE FOR TUMOR (0/14).  - HYPERPLASTIC POLYP (X1).  - SURGICAL MARGINS, NEGATIVE FOR TUMOR  - SEE TUMOR SYNOPTIC TEMPLATE BELOW.  Microscopic Comment  2. COLON AND RECTUM (INCLUDING TRANS-ANAL RESECTION  Assessment/Plan  LAPAROSCOPIC LOW  ANTERIOR RESECTION WITH SPLENIC FLEXURE MOBILIZATION (N/A)  diverting OSTOMY (N/A) [id number], Leighton Ruff, MD  S/P prolonged ileus and early post op bowel obstruction: SBFT showed resolution.  AKI/CKD- non-oliguric. (CKD stage 3 at baseline with Scr of 1.8-2.1)  Hypertension  AODM  Malnutrition/dehydration  Plan:  Continue to monitor weight and intake.  I have ask him to start keeping track of his intake and output.  So he can have some experience when he gets home with keeping his fluid balance up.  i will recheck labs in AM. I will give him an extra liter NS today and some Potassium today.        LOS: 37 days    Jason [name] 06/26/2013

## 2013-06-26 NOTE — Progress Notes (Signed)
I have seen and examined the pt and agree with PA-JEnning's progress note. Stool firming up K+ replacement today

## 2013-06-26 NOTE — Progress Notes (Signed)
Patient complain of nausea. Patient was given 4 mg IV Zofran with no relief. Patient denies pain and stated appetite is good. Spoke with Dr. Rosendo Gros, will give 12.5 mg IV Phenergan x one dose.  Will continue to monitor.

## 2013-06-27 LAB — CBC
Hemoglobin: 8.3 g/dL — ABNORMAL LOW (ref 13.0–17.0)
MCH: 28.2 pg (ref 26.0–34.0)
MCHC: 32.4 g/dL (ref 30.0–36.0)
Platelets: 217 10*3/uL (ref 150–400)
RBC: 2.94 MIL/uL — ABNORMAL LOW (ref 4.22–5.81)

## 2013-06-27 LAB — COMPREHENSIVE METABOLIC PANEL
ALT: 12 U/L (ref 0–53)
AST: 10 U/L (ref 0–37)
Alkaline Phosphatase: 90 U/L (ref 39–117)
CO2: 22 mEq/L (ref 19–32)
Calcium: 8.7 mg/dL (ref 8.4–10.5)
Creatinine, Ser: 2.99 mg/dL — ABNORMAL HIGH (ref 0.50–1.35)
GFR calc Af Amer: 24 mL/min — ABNORMAL LOW (ref >90)
GFR calc non Af Amer: 20 mL/min — ABNORMAL LOW (ref >90)
Potassium: 3.6 mEq/L (ref 3.5–5.1)
Sodium: 131 mEq/L — ABNORMAL LOW (ref 135–145)
Total Protein: 6.4 g/dL (ref 6.0–8.3)

## 2013-06-27 LAB — MAGNESIUM: Magnesium: 1.1 mg/dL — ABNORMAL LOW (ref 1.5–2.5)

## 2013-06-27 MED ORDER — CALCIUM POLYCARBOPHIL 625 MG PO TABS
625.0000 mg | ORAL_TABLET | Freq: Every day | ORAL | Status: DC
  Administered 2013-06-27 – 2013-06-28 (×2): 625 mg via ORAL
  Filled 2013-06-27 (×2): qty 1

## 2013-06-27 MED ORDER — MAGNESIUM OXIDE 400 (241.3 MG) MG PO TABS
400.0000 mg | ORAL_TABLET | Freq: Two times a day (BID) | ORAL | Status: DC
  Administered 2013-06-27 – 2013-06-28 (×3): 400 mg via ORAL
  Filled 2013-06-27 (×4): qty 1

## 2013-06-27 NOTE — Consult Note (Signed)
WOC ostomy consult note Stoma type/location: RLQ, end ileostomy.  Pt lying in bed, we discussed what he has learned so far with care of his ostomy.  Known to the [facility] team as we have each seen him post surgery at [facility].  We have been working with him but it seems he may not be able to learn this care based on his cognitive ability.  Stomal assessment/size: 1" round, budded from skin Peristomal assessment: pouch intact, changed yesterday Treatment options for stomal/peristomal skin: we have been using 2" barrier rings to aid in seal since he has high output stoma Output liquid, green effluent  Ostomy pouching: 2pc. In place, extra supplies in the room.   Education provided: demonstrated "burping" the gas from his pouch and also again reviewed the need to empty the pouch when 1/3 to 1/2 full, he reports he has been emptying his own, however nursing staff does not report same. When I tried to have him demonstrate skills he could not.  He can not understand the concepts apparently to demonstrate them back to me.  I feel this patient will need HHRN for continued support at home and working further with his wife.  I have been unable to connect with the wife for teaching.  [place] team will follow along with you for ostomy support/education  Lether Tesch Liane Comber RN,CWOCN [id number]

## 2013-06-27 NOTE — Progress Notes (Signed)
38 Days Post-Op  Subjective: Having occasional nausea.  Ate all of his breakfast.  Ostomy output at goal yesterday but not Sat.   Objective: Vital signs in last 24 hours: Temp:  [97.6 F (36.4 C)-98.7 F (37.1 C)] 98.7 F (37.1 C) (12/22 0457) Pulse Rate:  [74-87] 87 (12/22 0457) Resp:  [20-24] 20 (12/22 0457) BP: (120-142)/(76-84) 120/76 mmHg (12/22 0457) SpO2:  [97 %-100 %] 100 % (12/22 0457) Last BM Date: 06/27/13  Intake/Output from previous day: 12/21 0701 - 12/22 0700 In: 1628.5 [P.O.:720; IV Piggyback:908.5] Out: 3040 [Urine:2200; Stool:840] Intake/Output this shift: Total I/O In: -  Out: 125 [Stool:125] General appearance: alert, cooperative and no distress Resp: nonlabored GI: soft, NT, min distention, no peritoneal signs.  ostomy with thin bilious output.   Lab Results:   Recent Labs  06/27/13 0605  WBC 8.8  HGB 8.3*  HCT 25.6*  PLT 217   BMET  Recent Labs  06/26/13 0453 06/27/13 0605  NA 135 131*  K 3.5 3.6  CL 99 97  CO2 23 22  GLUCOSE 90 84  BUN 28* 25*  CREATININE 3.16* 2.99*  CALCIUM 9.0 8.7   PT/INR No results found for this basename: LABPROT, INR,  in the last 72 hours ABG No results found for this basename: PHART, PCO2, PO2, HCO3,  in the last 72 hours  Studies/Results: No results found.  Anti-infectives: Anti-infectives   Start     Dose/Rate Route Frequency Ordered Stop   06/05/13 1400  piperacillin-tazobactam (ZOSYN) IVPB 3.375 g  Status:  Discontinued     3.375 g 12.5 mL/hr over 240 Minutes Intravenous 3 times per day 06/05/13 1159 06/10/13 0815   06/01/13 1600  piperacillin-tazobactam (ZOSYN) IVPB 2.25 g  Status:  Discontinued     2.25 g 100 mL/hr over 30 Minutes Intravenous 4 times per day 06/01/13 1024 06/05/13 1159   05/31/13 1300  piperacillin-tazobactam (ZOSYN) IVPB 3.375 g  Status:  Discontinued     3.375 g 12.5 mL/hr over 240 Minutes Intravenous 3 times per day 05/31/13 1212 06/01/13 1025   05/21/13 0200  cefoTEtan  (CEFOTAN) 2 g in dextrose 5 % 50 mL IVPB     2 g 100 mL/hr over 30 Minutes Intravenous Every 12 hours 05/20/13 1808 05/21/13 0203   05/20/13 0551  cefoTEtan (CEFOTAN) 2 g in dextrose 5 % 50 mL IVPB     2 g 100 mL/hr over 30 Minutes Intravenous On call to O.R. 05/20/13 0551 05/20/13 1345      Assessment/Plan: s/p Procedure(s): LAPAROSCOPIC LOW ANTERIOR RESECTION WITH SPLENIC FLEXURE MOBILIZATION (N/A) diverting OSTOMY (N/A) S/P prolonged ileus and early post op bowel obstruction: SBFT showed resolution. Cont renal diet today.  Ostomy output better yesterday on imodium.  Encouraged pt to drink plenty of fluids, especially when ostomy output high.  Will start a fiber pill to help thicken stools. HTN: Coreg dose titrated per nephrology ARF: Etiology seems to be ATN per Nephrology.  Cr decreasing.  IVF's per nephrology, appreciate assistance.  Monitor UOP Dispo: Hopefully home this week  Cont ambulation, SCD's, Inc spirometry   LOS: 38 days    Kjersten Ormiston C. [id number]

## 2013-06-28 ENCOUNTER — Telehealth (INDEPENDENT_AMBULATORY_CARE_PROVIDER_SITE_OTHER): Payer: Self-pay | Admitting: General Surgery

## 2013-06-28 LAB — RENAL FUNCTION PANEL
CO2: 23 mEq/L (ref 19–32)
Chloride: 98 mEq/L (ref 96–112)
Creatinine, Ser: 2.88 mg/dL — ABNORMAL HIGH (ref 0.50–1.35)
GFR calc Af Amer: 25 mL/min — ABNORMAL LOW (ref >90)
GFR calc non Af Amer: 21 mL/min — ABNORMAL LOW (ref >90)

## 2013-06-28 MED ORDER — ACETAMINOPHEN 325 MG PO TABS: 650.0000 mg | ORAL_TABLET | ORAL | Status: DC | PRN

## 2013-06-28 MED ORDER — LOPERAMIDE HCL 2 MG PO CAPS: 2.0000 mg | ORAL_CAPSULE | Freq: Three times a day (TID) | ORAL | Status: DC

## 2013-06-28 MED ORDER — SODIUM CHLORIDE 0.9 % IJ SOLN: 10.0000 mL | INTRAMUSCULAR | Status: DC | PRN

## 2013-06-28 MED ORDER — OXYCODONE-ACETAMINOPHEN 5-325 MG PO TABS: 1.0000 | ORAL_TABLET | ORAL | Status: DC | PRN

## 2013-06-28 MED ORDER — CALCIUM POLYCARBOPHIL 625 MG PO TABS: 625.0000 mg | ORAL_TABLET | Freq: Every day | ORAL | Status: DC

## 2013-06-28 MED ORDER — CARVEDILOL 6.25 MG PO TABS: 6.2500 mg | ORAL_TABLET | Freq: Two times a day (BID) | ORAL | Status: DC

## 2013-06-28 NOTE — Care Management Note (Signed)
   CARE MANAGEMENT NOTE 06/28/2013  Patient:  Jason [name], Jason [name]   Account Number:  [id number]  Date Initiated:  05/21/2013  Documentation initiated by:  [facility]  Subjective/Objective Assessment:   67 year old male admitted s/p LAPAROSCOPIC LOW ANTERIOR RESECTION WITH SPLENIC FLEXURE MOBILIZATION, diverting OSTOMY     Action/Plan:   From home.  06/28/2013 Pt selected [facility] for [facility] services several days ago, [facility] following for d/c, to begin services.   Anticipated DC Date:  06/28/2013   Anticipated DC Plan:  [place]  CM consult      Choice offered to / List presented to:  C-1 Patient        [place] arranged  HH-1 RN      [place].   Status of service:  Completed, signed off Medicare Important Message given?  NO (If response is "NO", the following Medicare IM given date fields will be blank) Date Medicare IM given:   Date Additional Medicare IM given:    Discharge Disposition:  [place]  Per UR Regulation:  Reviewed for med. necessity/level of care/duration of stay  If discussed at [place] of Stay Meetings, dates discussed:   05/26/2013  05/31/2013  06/07/2013  06/09/2013  06/14/2013  06/16/2013    Comments:   06/28/2013 Pt followed by [facility] to begin services upon pt d/c to home. Jasmine Pang RN MPH, case manager, [phone]  06/24/2013  [place], [place] Discussed with patient choice for home health services. [facility].  06/16/13 KATHY MAHABIR RN,BSN NCM [id number] POD#27,D/C NGT,CLEARS,TPN,ILEOSTOMY-HIGH OUTPUT.NEPHROLOGY FOLLOWING-NO ACUTE NEED FOR DIALYSIS,MONITORING BUN/CR.D/C PLAN HOME W/HHRN/PT/OT-[facility] FOLLOWING.AWAIT FINAL HHC ORDERS.  06/14/13 KATHY MAHABIR RN,BSN NCM [place] FOLLOWING-CURRENTLY NO DIALYSIS.D/C PLAN HOME W/HH-[facility].  06/13/13 KATHY MAHABIR RN,BSN NCM [id number]  [id number] POD#24,NGT CLAMPING,HICCUPS,CLEARS,TPN,ILEOSTOMY.D/C PLAN HOME W/HH-[facility].  06/09/13 KATHY MAHABIR RN,BSN NCM [id number] POD#20, NGT-OUTPUT,IVF REPLACEMENT,IV ABX.D/C HOME [place].[facility] FOLLOWING.  06/08/13 KATHY MAHABIR RN,BSN NCM [id number] POD#19,PER MD-PATIENT HAS AGREED TO NGT.IELOSTOMY.NPO,IVF,IV ABX.D/C HOME [place] FOLLOWING.  06/06/13 KATHY MAHABIR RN,BSN NCM [id number] POD#17,P/O ILEUS,ILEOSTOMY-HIGH OUTPUT.NPO,TPN@65 ,IVF@100 ,IV ABX.D/C PLAN HOME [facility] ALREADY FOLLOWING HHRN/PT.AWAIT FINAL HHC ORDERS.  06/03/13 KATHY MAHABIR RN,BSN NCM [id number] POD#14,NGT D/C,JP D/C.TNA WEANING,LOW FIBER DIET.IVF,IV ABX.[facility] FOLLOWING FOR HHC ORDERS.RECOMMEND HHRN/PT.AWAIT FINAL HHC ORDERS.  05/30/13 KATHY MAHABIR RN,BSN NCM [id number] POD#10, P/O ILEUS,NGT-SUCTION,NPO,PICC-TNA,JP-SEROSANG.KRUN,IV DILAUDID.D/C PLAN HOME [facility] HHRN/PT.  05/27/13 KATHY MAHABIR RN,BSN NCM [id number] POD#7,N/V,PICC-TNA,JP INTACT.[facility] FOLLOWING FOR HHC HHRN/PT.  05/25/13 KATHY MAHABIR RN,BSN NCM [place] FOLLOW[place] [facility]HC HHRN/PT.AWAIT HHC FINAL ORDERS.  05/24/13 KATHY MAHABIR RN,BSN NCM [id number] [facility] CHOSEN FOR HHC,KRISTEN REP AWARE & FOLLOWING.AWAIT FINAL HHC ORDERS. POD#4 COLOANAL ANASTOMOSIS, &  DIVERTING ILEOSTOMY.CLEARS,S/P 2UPRBC,ILEUS.D/C PLAN HOME [facility].HHRN/PT/AWAIT FINAL HH ORDERS.PROVIDED PATIENT [facility] AGENCY LIST.

## 2013-06-28 NOTE — Telephone Encounter (Signed)
Explained d/c conditions to pt's wife.

## 2013-06-28 NOTE — Discharge Summary (Signed)
Physician Discharge Summary  Patient ID: JAHI RISTER MRN: [id number] DOB/AGE: [birth year] 66 y.o.  Admit date: 05/20/2013 Discharge date: 06/28/2013  Admission Diagnoses: Rectal cancer, Diabetes, Stage 2 kidney disease  Discharge Diagnoses:  Principal Problem:   Rectal cancer Acute on Chronic Renal Insuffiencey Urinary retention Malnutrition  Diabetes  Discharged Condition: stable  Hospital Course: This is a 67 year old male who underwent a low anterior resection with coloanal anastomosis for a distal rectal cancer.  His postoperative stay was complicated by urinary tension requiring a Foley catheter for approximately 2 weeks. He also had a prolonged postoperative ileus which lasted approximately 4 weeks. He was on TPN for malnutrition during this time the. This was also accompanied by a rise in his white blood cell count approximately one week after his surgery. A cause could not be determined. He was placed on a 10 day course of Zosyn.  His ileus was associated with high NG outputs. He then developed high ileostomy outputs as well. This led to difficulty with fluid management and acute on chronic renal insufficiency. Nephrology was consulted. They managed his IV fluid replacements. Despite this, he seemed to develop ATN. His creatinine peaked at a level just under 10. It been slowly returned to a baseline of under 3. He was not oliguric throughout this period.  Once his ileus resolved and we were able to get control of his ostomy outputs using Imodium and fiber supplements. Over the past few days he has demonstrated that he is able to keep up with his fluids orally. He is also recording his ostomy outputs to make sure that the stay under 1 L a day. During the course of his hospital stay his nephrologist have changed his blood pressure medications Coreg and have stopped his atenolol, hydrochlorothiazide and Norvasc. Upon discharge he will closely monitor his ostomy outputs and call the office if  his ostomy outputs are over 1 L a day.  We will follow his creatinine on a weekly basis for the first few weeks out of the hospital.  I will see him back in approximately 10-14 days. I've asked for the nephrologist to see him back shortly as well. We will have home health RN and evaluate the patient and help with his ostomy needs.  Consults: nephrology  Significant Diagnostic Studies: labs: cbc, chemistries, renal US, CT abd and UGI with SBFT  Treatments: IV hydration, antibiotics: Zosyn, analgesia: acetaminophen w/ codeine and Dilaudid, TPN and surgery: see above  Discharge Exam: Blood pressure 114/71, pulse 96, temperature 98.2 F (36.8 C), temperature source Oral, resp. rate 20, height 5\' 7"  (1.702 m), weight 192 lb 4.8 oz (87.227 kg), SpO2 100.00%. General appearance: alert and cooperative GI: normal findings: soft, non-tender and ostomy pink and viable Incision/Wound: healing well  Disposition: 01-Home or Self Care   Future Appointments Provider Department Dept Phone   11/17/2013 10:00 AM Marye Round, MD [place] [phone]       Medication List    STOP taking these medications       amLODipine 10 MG tablet  Commonly known as:  NORVASC     atenolol-chlorthalidone 50-25 MG per tablet  Commonly known as:  TENORETIC      TAKE these medications       acetaminophen 325 MG tablet  Commonly known as:  TYLENOL  Take 2 tablets (650 mg total) by mouth every 4 (four) hours as needed for moderate pain.     allopurinol 300 MG tablet  Commonly known as:  ZYLOPRIM  Take 300 mg by mouth every morning.     carvedilol 6.25 MG tablet  Commonly known as:  COREG  Take 1 tablet (6.25 mg total) by mouth 2 (two) times daily with a meal.     doxazosin 8 MG tablet  Commonly known as:  CARDURA  Take 8 mg by mouth at bedtime.     loperamide 2 MG capsule  Commonly known as:  IMODIUM  Take 1 capsule (2 mg total) by mouth 3 (three) times daily  before meals.     oxyCODONE-acetaminophen 5-325 MG per tablet  Commonly known as:  PERCOCET/ROXICET  Take 1-2 tablets by mouth every 4 (four) hours as needed for severe pain.     polycarbophil 625 MG tablet  Commonly known as:  FIBERCON  Take 1 tablet (625 mg total) by mouth daily.     potassium chloride SA 20 MEQ tablet  Commonly known as:  K-DUR,KLOR-CON  Take 20 mEq by mouth daily.           Follow-up Information   Follow up with Rosario Adie., MD. Schedule an appointment as soon as possible for a visit in 10 days.   Specialty:  General Surgery   Contact information:   [address] [place] [address] [phone]       Follow up with [facility] C, MD. Schedule an appointment as soon as possible for a visit in 2 weeks.   Specialty:  Nephrology   Contact information:   [place] [address] [phone]       Follow up with TODD,JEFFREY ALLEN, MD. Schedule an appointment as soon as possible for a visit in 2 weeks.   Specialty:  Family Medicine   Contact information:   [place] [address] [phone]       Signed: Rosario Adie [id number], 10:02 AM

## 2013-07-04 ENCOUNTER — Telehealth (INDEPENDENT_AMBULATORY_CARE_PROVIDER_SITE_OTHER): Payer: Self-pay

## 2013-07-04 NOTE — Telephone Encounter (Signed)
Called and gave order to draw BMET tomorrow when the home health nurse goes out

## 2013-07-06 ENCOUNTER — Telehealth: Payer: Self-pay | Admitting: *Deleted

## 2013-07-06 NOTE — Telephone Encounter (Signed)
Gave patient his appointment for 07/21/13 at 2:15 with Lattie Haw [name]/BS.

## 2013-07-13 ENCOUNTER — Encounter (INDEPENDENT_AMBULATORY_CARE_PROVIDER_SITE_OTHER): Payer: Self-pay

## 2013-07-13 ENCOUNTER — Encounter (INDEPENDENT_AMBULATORY_CARE_PROVIDER_SITE_OTHER): Payer: Self-pay | Admitting: General Surgery

## 2013-07-13 ENCOUNTER — Ambulatory Visit (INDEPENDENT_AMBULATORY_CARE_PROVIDER_SITE_OTHER): Payer: Medicare Other | Admitting: General Surgery

## 2013-07-13 VITALS — BP 134/88 | HR 80 | Temp 98.0°F | Resp 16 | Ht 67.0 in | Wt 185.4 lb

## 2013-07-13 DIAGNOSIS — C20 Malignant neoplasm of rectum: Secondary | ICD-10-CM

## 2013-07-13 NOTE — Progress Notes (Signed)
Jason [name] is a 68 y.o. male who is status post a LAR with colo-anal anastomosis on 11/14.  His postoperative course was complicated by a prolonged ileus, high output ileostomy, acute on chronic renal failure and malnutrition. He was discharged in late December. He is in keeping up with his ostomy outputs. It appears that he has approximately 1-1-1/2 L of output a day. He reports good urine output. His creatinine last week was 1.98 and stable. He denies any abdominal pain. He denies any rectal pain or rectal discharge. His final pathology revealed a T3 N0 rectal mass.  Objective: Filed Vitals:   07/13/13 0942  BP: 134/88  Pulse: 80  Temp: 98 F (36.7 C)  Resp: 16    General appearance: alert and cooperative GI: soft, ostomy functioning well  Incision: healing well The posterior portion of his coloanal anastomosis has not quite completely healed. He does have some mild stenosis but I am able to traverse this with my index finger.  Assessment: s/p  Patient Active Problem List   Diagnosis Date Noted  . Rectal cancer 02/01/2013  . Anemia 07/20/2012  . Chronic renal insufficiency 07/31/2011  . EDEMA 09/21/2010  . OBSTRUCTIVE SLEEP APNEA 07/29/2010  . GOUT 05/11/2007  . ERECTILE DYSFUNCTION, MILD 05/11/2007  . HYPERTENSION 05/11/2007  . COLONIC POLYPS, HX OF 05/11/2007  Diagnosis 1. Soft tissue, biopsy, circumferential margin - BENIGN SOFT TISSUE - NEGATIVE FOR MALIGNANCY. 2. Colon, segmental resection for tumor, distal rectum - INVASIVE ADENOCARCINOMA WITH NEOADJUVANT RELATED CHANGE, SEE COMMENT. - TUMOR INVADES INTO PERIRECTAL SOFT TISSUE. - NO LYMPHOVASCULAR OR PERINEURAL INVASION IS IDENTIFIED. - FOURTEEN LYMPH NODES, NEGATIVE FOR TUMOR (0/14). - HYPERPLASTIC POLYP (X1). - SURGICAL MARGINS, NEGATIVE FOR TUMOR  Plan: Jason [name] Is a 68 year old male who is status post low anterior resection and coloanal anastomosis for a large rectal tumor. I think he will be ready for  ostomy reversal next month. I will see him back in 3-4 weeks to evaluate his anastomosis further. I will discuss surgery plans with Dr. Learta Codding, as he needs to complete his adjuvant chemotherapy as well.    Rosario Adie, [facility], [place]   07/13/2013 10:12 AM

## 2013-07-13 NOTE — Patient Instructions (Signed)
Take an imodium capsule twice daily.  Call the office if your urination decreases or becomes very dark colored

## 2013-07-14 ENCOUNTER — Encounter: Payer: Self-pay | Admitting: Family Medicine

## 2013-07-14 ENCOUNTER — Ambulatory Visit (INDEPENDENT_AMBULATORY_CARE_PROVIDER_SITE_OTHER): Payer: Medicare Other | Admitting: Family Medicine

## 2013-07-14 VITALS — BP 120/80 | Temp 98.5°F | Wt 187.0 lb

## 2013-07-14 DIAGNOSIS — I10 Essential (primary) hypertension: Secondary | ICD-10-CM

## 2013-07-14 DIAGNOSIS — C20 Malignant neoplasm of rectum: Secondary | ICD-10-CM

## 2013-07-14 DIAGNOSIS — Z23 Encounter for immunization: Secondary | ICD-10-CM

## 2013-07-14 DIAGNOSIS — M109 Gout, unspecified: Secondary | ICD-10-CM

## 2013-07-14 MED ORDER — ALLOPURINOL 300 MG PO TABS: 300.0000 mg | ORAL_TABLET | Freq: Every morning | ORAL | Status: DC

## 2013-07-14 MED ORDER — POTASSIUM CHLORIDE CRYS ER 20 MEQ PO TBCR: 20.0000 meq | EXTENDED_RELEASE_TABLET | Freq: Every day | ORAL | Status: DC

## 2013-07-14 MED ORDER — CARVEDILOL 6.25 MG PO TABS: 6.2500 mg | ORAL_TABLET | Freq: Two times a day (BID) | ORAL | Status: DC

## 2013-07-14 NOTE — Patient Instructions (Signed)
Continue your current medications  Begin a walking program tenderness in the morning and 10 minutes in the afternoon   Return when necessary

## 2013-07-14 NOTE — Progress Notes (Signed)
   Subjective:    Patient ID: Jason [name], male    DOB: [birth year], 68 y.o.   MRN: [id number]  HPI Jason [name] is a 68 year old male who comes in today accompanied by  his wife for followup  6 he was hospitalized for 6 weeks this past fall resection of a rectal cancer. He has a colostomy was functioning well.  6 he's off the pain medication usually takes 3 Tylenol per day when necessary  He takes allopurinol 300 mg daily to prevent gout  He takes Coreg 6.25 mg daily for hypertension and Cardura 8 mg daily  He takes one 20 mEq potassium supplement.  He had a surgical followup with Dr. Marcello [name] yesterday all his going well and he also has been followed by his nephrologist Dr. Bunnie [name]. He is not walking on a regular basis or doing any exercise so well-developed well-nourished thin male no acute distress vital signs stable weight 187   Review of Systems    review of systems negative Objective:   Physical Exam  Well-developed well-nourished male no acute distress vital signs stable is afebrile weight 187      Assessment & Plan:  Hypertension at goal continue current therapy  Gout asymptomatic continue current therapy  6 renal insufficiency followed by Dr. Bunnie [name] when necessary  Status post colostomy for rectal cancer continue followup as outlined  Begin a walking program to increase his strength endurance

## 2013-07-14 NOTE — Progress Notes (Signed)
Pre visit review using our clinic review tool, if applicable. No additional management support is needed unless otherwise documented below in the visit note. 

## 2013-07-21 ENCOUNTER — Telehealth: Payer: Self-pay | Admitting: Oncology

## 2013-07-21 ENCOUNTER — Ambulatory Visit (HOSPITAL_BASED_OUTPATIENT_CLINIC_OR_DEPARTMENT_OTHER): Payer: Medicare Other

## 2013-07-21 ENCOUNTER — Ambulatory Visit (HOSPITAL_BASED_OUTPATIENT_CLINIC_OR_DEPARTMENT_OTHER): Payer: Medicare Other | Admitting: Nurse Practitioner

## 2013-07-21 VITALS — BP 124/66 | HR 87 | Temp 98.1°F | Resp 18 | Ht 67.0 in | Wt 181.2 lb

## 2013-07-21 DIAGNOSIS — D649 Anemia, unspecified: Secondary | ICD-10-CM

## 2013-07-21 DIAGNOSIS — N289 Disorder of kidney and ureter, unspecified: Secondary | ICD-10-CM

## 2013-07-21 DIAGNOSIS — R911 Solitary pulmonary nodule: Secondary | ICD-10-CM

## 2013-07-21 DIAGNOSIS — I10 Essential (primary) hypertension: Secondary | ICD-10-CM

## 2013-07-21 DIAGNOSIS — C20 Malignant neoplasm of rectum: Secondary | ICD-10-CM

## 2013-07-21 DIAGNOSIS — K7689 Other specified diseases of liver: Secondary | ICD-10-CM

## 2013-07-21 LAB — COMPREHENSIVE METABOLIC PANEL (CC13)
ALK PHOS: 104 U/L (ref 40–150)
ALT: 19 U/L (ref 0–55)
ANION GAP: 13 meq/L — AB (ref 3–11)
AST: 14 U/L (ref 5–34)
Albumin: 4 g/dL (ref 3.5–5.0)
BILIRUBIN TOTAL: 0.36 mg/dL (ref 0.20–1.20)
BUN: 54.1 mg/dL — ABNORMAL HIGH (ref 7.0–26.0)
CO2: 18 mEq/L — ABNORMAL LOW (ref 22–29)
Calcium: 10.3 mg/dL (ref 8.4–10.4)
Chloride: 101 mEq/L (ref 98–109)
Creatinine: 3 mg/dL (ref 0.7–1.3)
Glucose: 101 mg/dl (ref 70–140)
Potassium: 5 mEq/L (ref 3.5–5.1)
SODIUM: 132 meq/L — AB (ref 136–145)
TOTAL PROTEIN: 8.1 g/dL (ref 6.4–8.3)

## 2013-07-21 LAB — CBC WITH DIFFERENTIAL/PLATELET
BASO%: 1.4 % (ref 0.0–2.0)
Basophils Absolute: 0.1 10*3/uL (ref 0.0–0.1)
EOS%: 3.4 % (ref 0.0–7.0)
Eosinophils Absolute: 0.2 10*3/uL (ref 0.0–0.5)
HCT: 35.2 % — ABNORMAL LOW (ref 38.4–49.9)
HGB: 11.3 g/dL — ABNORMAL LOW (ref 13.0–17.1)
LYMPH%: 7.6 % — ABNORMAL LOW (ref 14.0–49.0)
MCH: 28 pg (ref 27.2–33.4)
MCHC: 32.3 g/dL (ref 32.0–36.0)
MCV: 86.9 fL (ref 79.3–98.0)
MONO#: 0.9 10*3/uL (ref 0.1–0.9)
MONO%: 14.1 % — AB (ref 0.0–14.0)
NEUT#: 4.7 10*3/uL (ref 1.5–6.5)
NEUT%: 73.5 % (ref 39.0–75.0)
PLATELETS: 288 10*3/uL (ref 140–400)
RBC: 4.05 10*6/uL — AB (ref 4.20–5.82)
RDW: 15.8 % — ABNORMAL HIGH (ref 11.0–14.6)
WBC: 6.4 10*3/uL (ref 4.0–10.3)
lymph#: 0.5 10*3/uL — ABNORMAL LOW (ref 0.9–3.3)

## 2013-07-21 NOTE — Progress Notes (Addendum)
OFFICE PROGRESS NOTE  Interval history:  Jason [name] returns for followup of rectal cancer. To review, he completed neoadjuvant Xeloda and radiation 02/21/2013 through 03/31/2013. Followup CEA on 04/25/2013 returned at 4.4 as compared to 132.6 on 01/21/2013.  On 05/23/2013 he underwent laparoscopic low anterior resection with diverting loop ileostomy. Final pathology showed a 3.2 cm invasive adenocarcinoma with neoadjuvant related change. Tumor invaded into perirectal soft tissue. There was no lymphovascular or perineural invasion. 14 lymph nodes were negative for tumor. Surgical margins were negative. There was one hyperplastic polyp.  He had a prolonged postoperative ileus. He required TPN. He subsequent developed high ileostomy output and acute on chronic renal failure. He was followed by Nephrology during hospitalization. He was transferred to [facility] in anticipation of beginning hemodialysis. Creatinine increased to just under 10 and then slowly improved. Dialysis was not initiated. Once the ileus resolved he was started on Imodium and fiber supplements with improvement of ostomy output. He was discharged home on 06/28/2013. The serum creatinine at that time was 2.88. Most recent serum creatinine value in our computer system returned at 1.98 on 07/05/2013.  He presents today to discuss adjuvant chemotherapy. He reports he is feeling much better. He estimates emptying the ileostomy bag 4 times a day. Output ranges from watery to solid. He is taking scheduled Imodium twice a day. He reports a good appetite. No nausea or vomiting. He denies shortness of breath. He had an episode of pain at the left buttock lasting about 20 minutes last night. No other pain. He denies fever.   Objective: Filed Vitals:   07/21/13 1438  BP: 124/66  Pulse: 87  Temp: 98.1 F (36.7 C)  Resp: 18   Oropharynx is without thrush or ulceration. Lungs are clear. Regular cardiac rhythm. Abdomen soft and nontender. No  hepatomegaly. Right abdomen ileostomy. Liquid stool in the collection bag. No leg edema.   Lab Results: Lab Results  Component Value Date   WBC 8.8 06/27/2013   HGB 8.3* 06/27/2013   HCT 25.6* 06/27/2013   MCV 87.1 06/27/2013   PLT 217 06/27/2013   NEUTROABS 6.4 06/20/2013    Chemistry:    Chemistry      Component Value Date/Time   NA 134* 06/28/2013 0500   NA 143 04/28/2013 0859   K 3.7 06/28/2013 0500   K 3.7 04/28/2013 0859   CL 98 06/28/2013 0500   CL 101 12/09/2012 1048   CO2 23 06/28/2013 0500   CO2 25 04/28/2013 0859   BUN 27* 06/28/2013 0500   BUN 18.5 04/28/2013 0859   CREATININE 2.88* 06/28/2013 0500   CREATININE 1.8* 04/28/2013 0859      Component Value Date/Time   CALCIUM 9.0 06/28/2013 0500   CALCIUM 10.1 04/28/2013 0859   ALKPHOS 90 06/27/2013 0605   ALKPHOS 84 04/28/2013 0859   AST 10 06/27/2013 0605   AST 10 04/28/2013 0859   ALT 12 06/27/2013 0605   ALT 8 04/28/2013 0859   BILITOT 0.2* 06/27/2013 0605   BILITOT 0.37 04/28/2013 0859       Studies/Results: No results found.  Medications: I have reviewed the patient's current medications.  Assessment/Plan: 1. Rectal cancer, clinical stage III (uT3, uN2).  Initiation of concurrent Xeloda and radiation on 02/21/2013, completed 03/31/2013. CEA normal 04/25/2013 Status post laparoscopic low anterior resection with diverting loop ileostomy 05/23/2013. Final pathology showed a 3.2 cm invasive adenocarcinoma with neoadjuvant related change. Tumor invaded into perirectal soft tissue. There was no lymphovascular or perineural invasion. [place]  lymph nodes were negative for tumor. Surgical margins were negative. There was one hyperplastic polyp (ypT3, pN0). 2. Markedly elevated pretreat CEA, normal 04/25/2013. 3. Indeterminate pulmonary/subpleural nodules and right hepatic dome lesion. Unchanged on the CT 04/25/2013 4. Renal insufficiency. Acute on chronic renal failure during hospitalization November/December  2014. Now followed by nephrology. 5. Anemia.  6. Hypertension. 7. Gout. 8. Prolonged postoperative ileus following laparoscopic low anterior resection with diverting loop ileostomy 05/23/2013. 9. High output ileostomy. Maintained on scheduled Imodium.  Dispositon-he continues to recover from surgery and postoperative complications.  Dr. Benay Spice reviewed the pathology report with Jason [name] and his wife and recommends adjuvant Xeloda for 5 cycles.  We reviewed potential toxicities associated with Xeloda including mouth sores, nausea, diarrhea, hand-foot syndrome, skin hyperpigmentation. He is agreeable to proceed. We anticipate he will begin cycle 1 on 07/26/2013. He will return for a followup visit on 08/12/2013.  At present he is maintained on Imodium due to a high output ileostomy. He understands to contact the office with increased ileostomy output once he begins Xeloda.  We are obtaining baseline labs today to include CBC, chemistry panel and CEA.  Patient seen with Dr. Benay Spice.   25 minutes were spent face-to-face at today's visit with the majority that time involved in counseling/coordination of care.  Ned Card ANP/GNP-BC    This was a shared visit with Ned Card.  Jason [name] has recovered from the rectal surgery. We discussed the surgical pathology report. He has pathologic stage II disease, but was felt to have lymph node involvement on the initial EUS. The pretreatment CEA was markedly elevated. I recommend adjuvant chemotherapy. I am concerned it will be difficult for him to tolerate adjuvant therapy with the ileostomy and persistent renal failure. We will plan for capecitabine therapy if the creatinine is improved today. If not we will consider 5-FU/leucovorin. We will check a CEA today.  Julieanne Manson, M.D.

## 2013-07-21 NOTE — Progress Notes (Signed)
Met with patient and wife to assess for needs.  He stated he is handling his ileostomy care well.  He is working with [employer] supplies and is in process of ordering barrier device supplies that he has run out of.  He would like to have some more education on ostomy care as his stoma has changed.  This RN gave him contact information for [employer] at [employer].  This Rn made an appointment for the patient.  No other needs identified.

## 2013-07-21 NOTE — Telephone Encounter (Signed)
gv and printed appt sched and avs for pt for Feb...sent pt to lab

## 2013-07-22 ENCOUNTER — Telehealth: Payer: Self-pay | Admitting: *Deleted

## 2013-07-22 ENCOUNTER — Other Ambulatory Visit: Payer: Self-pay | Admitting: Family Medicine

## 2013-07-22 ENCOUNTER — Telehealth (INDEPENDENT_AMBULATORY_CARE_PROVIDER_SITE_OTHER): Payer: Self-pay

## 2013-07-22 ENCOUNTER — Telehealth: Payer: Self-pay | Admitting: Oncology

## 2013-07-22 DIAGNOSIS — C20 Malignant neoplasm of rectum: Secondary | ICD-10-CM

## 2013-07-22 LAB — CEA: CEA: 0.8 ng/mL (ref 0.0–5.0)

## 2013-07-22 NOTE — Telephone Encounter (Signed)
Talked to pt again.  He has spoken to someone from [facility] and is aware to increase fluid intake

## 2013-07-22 NOTE — Telephone Encounter (Signed)
Message copied by Gweneth Fritter on Fri Jul 22, 2013  9:19 AM ------      Message from: Felton, Gifford Shave.      Created: Fri Jul 22, 2013  7:46 AM      Regarding: Please call pt       Can you please call him and let him know that his renal numbers are up?  He needs to drink an extra 64 oz of water today and increase his imodium to 2 mg 4 times a day (from bid).  Also please make sure his PCP is notified Macon Large).  His potassium is 5, and he needs to be followed closely.  I believe Dr Benay Spice has notified his nephrologist.            Thanks             AT ------

## 2013-07-22 NOTE — Telephone Encounter (Signed)
Faxed CMET results to Dr. Posey Pronto. Creatinine 3.0. Called pt, instructed him to push PO fluids. Increase Imodium. He reports he is taking #2 tabs daily. Instructed him to take Imodium 2 tab TID. He voiced understanding. Knows to expect call from schedulers to repeat labs in one week.

## 2013-07-22 NOTE — Telephone Encounter (Signed)
I called the pt and let him know Dr Marcello Moores' instructions below.  He states his kidney dr called him earlier this week and told him to decrease his water to 32 oz.  I advised he should double check with him again to see if he has the latest results.  I will get the results to Dr Sherren Mocha as well.

## 2013-07-22 NOTE — Telephone Encounter (Signed)
He needs to drink more to replace his ostomy outputs.  Please have the nephrologist contact me if they have any concerns about this.

## 2013-07-22 NOTE — Telephone Encounter (Signed)
Talked to pt appt for lab on 1/22

## 2013-07-28 ENCOUNTER — Telehealth: Payer: Self-pay | Admitting: *Deleted

## 2013-07-28 ENCOUNTER — Encounter (INDEPENDENT_AMBULATORY_CARE_PROVIDER_SITE_OTHER): Payer: Self-pay

## 2013-07-28 ENCOUNTER — Telehealth: Payer: Self-pay | Admitting: Oncology

## 2013-07-28 ENCOUNTER — Other Ambulatory Visit (HOSPITAL_BASED_OUTPATIENT_CLINIC_OR_DEPARTMENT_OTHER): Payer: Medicare Other

## 2013-07-28 DIAGNOSIS — C20 Malignant neoplasm of rectum: Secondary | ICD-10-CM

## 2013-07-28 LAB — CBC WITH DIFFERENTIAL/PLATELET
BASO%: 0.4 % (ref 0.0–2.0)
BASOS ABS: 0 10*3/uL (ref 0.0–0.1)
EOS ABS: 0.2 10*3/uL (ref 0.0–0.5)
EOS%: 4 % (ref 0.0–7.0)
HEMATOCRIT: 33.3 % — AB (ref 38.4–49.9)
HEMOGLOBIN: 11.1 g/dL — AB (ref 13.0–17.1)
LYMPH%: 15.2 % (ref 14.0–49.0)
MCH: 27.5 pg (ref 27.2–33.4)
MCHC: 33.3 g/dL (ref 32.0–36.0)
MCV: 82.4 fL (ref 79.3–98.0)
MONO#: 0.4 10*3/uL (ref 0.1–0.9)
MONO%: 6.9 % (ref 0.0–14.0)
NEUT#: 4.1 10*3/uL (ref 1.5–6.5)
NEUT%: 73.5 % (ref 39.0–75.0)
PLATELETS: 233 10*3/uL (ref 140–400)
RBC: 4.04 10*6/uL — ABNORMAL LOW (ref 4.20–5.82)
RDW: 14.4 % (ref 11.0–14.6)
WBC: 5.5 10*3/uL (ref 4.0–10.3)
lymph#: 0.8 10*3/uL — ABNORMAL LOW (ref 0.9–3.3)

## 2013-07-28 LAB — COMPREHENSIVE METABOLIC PANEL (CC13)
ALBUMIN: 4.1 g/dL (ref 3.5–5.0)
ALK PHOS: 86 U/L (ref 40–150)
ALT: 10 U/L (ref 0–55)
AST: 12 U/L (ref 5–34)
Anion Gap: 10 mEq/L (ref 3–11)
BUN: 58.4 mg/dL — AB (ref 7.0–26.0)
CO2: 26 mEq/L (ref 22–29)
CREATININE: 3 mg/dL — AB (ref 0.7–1.3)
Calcium: 11.1 mg/dL — ABNORMAL HIGH (ref 8.4–10.4)
Chloride: 94 mEq/L — ABNORMAL LOW (ref 98–109)
Glucose: 106 mg/dl (ref 70–140)
POTASSIUM: 4.2 meq/L (ref 3.5–5.1)
Sodium: 130 mEq/L — ABNORMAL LOW (ref 136–145)
Total Bilirubin: 0.39 mg/dL (ref 0.20–1.20)
Total Protein: 8.1 g/dL (ref 6.4–8.3)

## 2013-07-28 LAB — TECHNOLOGIST REVIEW

## 2013-07-28 NOTE — Telephone Encounter (Signed)
CMET reviewed by Dr. Benay Spice: Can not take Xeloda with creatinine at 3.0. Will need to be seen in office to discuss treatment options. Forward labs to Dr. Marcello Moores and Dr. Posey Pronto. Same done. Called pt with appt for 1/23. He voiced understanding.

## 2013-07-28 NOTE — Telephone Encounter (Signed)
s.w. pt and advised on 1.23.15 appt...pt ok adn aware

## 2013-07-29 ENCOUNTER — Telehealth: Payer: Self-pay | Admitting: Oncology

## 2013-07-29 ENCOUNTER — Ambulatory Visit (HOSPITAL_BASED_OUTPATIENT_CLINIC_OR_DEPARTMENT_OTHER): Payer: Medicare Other | Admitting: Oncology

## 2013-07-29 ENCOUNTER — Telehealth: Payer: Self-pay | Admitting: *Deleted

## 2013-07-29 VITALS — BP 116/69 | HR 74 | Temp 98.3°F | Resp 18 | Ht 67.0 in | Wt 179.7 lb

## 2013-07-29 DIAGNOSIS — D649 Anemia, unspecified: Secondary | ICD-10-CM

## 2013-07-29 DIAGNOSIS — C20 Malignant neoplasm of rectum: Secondary | ICD-10-CM

## 2013-07-29 DIAGNOSIS — K7689 Other specified diseases of liver: Secondary | ICD-10-CM

## 2013-07-29 DIAGNOSIS — R918 Other nonspecific abnormal finding of lung field: Secondary | ICD-10-CM

## 2013-07-29 DIAGNOSIS — N289 Disorder of kidney and ureter, unspecified: Secondary | ICD-10-CM

## 2013-07-29 NOTE — Telephone Encounter (Signed)
Per staff message and POF I have scheduled appts.  JMW  

## 2013-07-29 NOTE — Progress Notes (Signed)
   [facility]    OFFICE PROGRESS NOTE   INTERVAL HISTORY:   He returns for scheduled followup of rectal cancer. [name] reports feeling well. He is emptying the ileostomy bag approximately 4 times per day. He is taking 4 Imodium tablets per day. The stool is sometimes formed.  Objective:  Vital signs in last 24 hours:  Blood pressure 116/69, pulse 74, temperature 98.3 F (36.8 C), temperature source Oral, resp. rate 18, height 5\' 7"  (1.702 m), weight 179 lb 11.2 oz (81.511 kg).    HEENT: the mucous membranes are moist Resp: lungs clear bilaterally Cardio: regular rate and rhythm GI: no hepatomegaly, nontender, right lower quadrant ileostomy with semi-formed brown stool Vascular: no leg edema   Lab Results:  Lab Results  Component Value Date   WBC 5.5 07/28/2013   HGB 11.1* 07/28/2013   HCT 33.3* 07/28/2013   MCV 82.4 07/28/2013   PLT 233 07/28/2013   NEUTROABS 4.1 07/28/2013    potassium 4.2, BUN 58.4, creatinine 3.0   Medications: I have reviewed the patient's current medications.  Assessment/Plan: 1. Rectal cancer, clinical stage III (uT3, uN2).  Initiation of concurrent Xeloda and radiation on 02/21/2013, completed 03/31/2013.  CEA normal 04/25/2013  Status post laparoscopic low anterior resection with diverting loop ileostomy 05/23/2013. Final pathology showed a 3.2 cm invasive adenocarcinoma with neoadjuvant related change. Tumor invaded into perirectal soft tissue. There was no lymphovascular or perineural invasion. 14 lymph nodes were negative for tumor. Surgical margins were negative. There was one hyperplastic polyp (ypT3, pN0). 2. Markedly elevated pretreat CEA, normal 04/25/2013. 3. Indeterminate pulmonary/subpleural nodules and right hepatic dome lesion. Unchanged on the CT 04/25/2013 4. Renal insufficiency. Acute on chronic renal failure during hospitalization November/December 2014. Now followed by nephrology. 5. Anemia.   6. Hypertension. 7. Gout. 8. Prolonged postoperative ileus following laparoscopic low anterior resection with diverting loop ileostomy 05/23/2013. 9. High output ileostomy. Maintained on scheduled Imodium.   Disposition:  He appears stable. The plan was to begin adjuvant Xeloda chemotherapy this week. He has a persistently elevated creatinine. We will therefore not prescribed Xeloda.  I recommend 5-FU/leucovorin per the [place] regimen. We reviewed the potential toxicities associated with weekly 5-FU/leucovorin including the chance for mucositis, diarrhea, hyperpigmentation,hematologic toxicity, and the hand/foot syndrome.  He agrees to proceed. The plan is to deliver weekly 5-FU/leucovorin beginning 08/01/2013. He will return for an office visit on 08/15/2013. We can interrupt the adjuvant chemotherapy for the ileostomy takedown procedure.  I encouraged him to increase the use of Imodium for loose stools.   Betsy Coder, MD  07/29/2013  9:46 PM

## 2013-07-29 NOTE — Telephone Encounter (Signed)
gave pt appt for lab and MD, left Sharyn Lull a VM for 1/26 chemo

## 2013-07-29 NOTE — Telephone Encounter (Signed)
Gave pt appt for lab and chemo for 08/01/13 advised pt to get appt calendar

## 2013-07-31 ENCOUNTER — Other Ambulatory Visit: Payer: Self-pay | Admitting: Oncology

## 2013-08-01 ENCOUNTER — Telehealth: Payer: Self-pay | Admitting: Oncology

## 2013-08-01 ENCOUNTER — Telehealth: Payer: Self-pay | Admitting: *Deleted

## 2013-08-01 ENCOUNTER — Other Ambulatory Visit (HOSPITAL_BASED_OUTPATIENT_CLINIC_OR_DEPARTMENT_OTHER): Payer: Medicare Other

## 2013-08-01 ENCOUNTER — Ambulatory Visit (HOSPITAL_BASED_OUTPATIENT_CLINIC_OR_DEPARTMENT_OTHER): Payer: Medicare Other

## 2013-08-01 ENCOUNTER — Encounter (INDEPENDENT_AMBULATORY_CARE_PROVIDER_SITE_OTHER): Payer: Self-pay

## 2013-08-01 VITALS — BP 124/79 | HR 73 | Temp 96.9°F | Resp 20

## 2013-08-01 DIAGNOSIS — C20 Malignant neoplasm of rectum: Secondary | ICD-10-CM

## 2013-08-01 DIAGNOSIS — Z5111 Encounter for antineoplastic chemotherapy: Secondary | ICD-10-CM

## 2013-08-01 LAB — COMPREHENSIVE METABOLIC PANEL (CC13)
ALT: 14 U/L (ref 0–55)
ANION GAP: 12 meq/L — AB (ref 3–11)
AST: 16 U/L (ref 5–34)
Albumin: 3.9 g/dL (ref 3.5–5.0)
Alkaline Phosphatase: 94 U/L (ref 40–150)
BUN: 49.9 mg/dL — ABNORMAL HIGH (ref 7.0–26.0)
CALCIUM: 10.7 mg/dL — AB (ref 8.4–10.4)
CHLORIDE: 92 meq/L — AB (ref 98–109)
CO2: 24 mEq/L (ref 22–29)
CREATININE: 2.9 mg/dL — AB (ref 0.7–1.3)
Glucose: 126 mg/dl (ref 70–140)
Potassium: 4.4 mEq/L (ref 3.5–5.1)
Sodium: 128 mEq/L — ABNORMAL LOW (ref 136–145)
TOTAL PROTEIN: 7.9 g/dL (ref 6.4–8.3)
Total Bilirubin: 0.43 mg/dL (ref 0.20–1.20)

## 2013-08-01 MED ORDER — DEXTROSE 5 % IV SOLN
500.0000 mg/m2 | Freq: Once | INTRAVENOUS | Status: AC
  Administered 2013-08-01: 980 mg via INTRAVENOUS
  Filled 2013-08-01: qty 49

## 2013-08-01 MED ORDER — FLUOROURACIL CHEMO INJECTION 2.5 GM/50ML
600.0000 mg/m2 | Freq: Once | INTRAVENOUS | Status: AC
  Administered 2013-08-01: 1200 mg via INTRAVENOUS
  Filled 2013-08-01: qty 24

## 2013-08-01 MED ORDER — PROCHLORPERAZINE MALEATE 10 MG PO TABS
ORAL_TABLET | ORAL | Status: AC
  Filled 2013-08-01: qty 1

## 2013-08-01 MED ORDER — SODIUM CHLORIDE 0.9 % IV SOLN
Freq: Once | INTRAVENOUS | Status: AC
  Administered 2013-08-01: 09:00:00 via INTRAVENOUS

## 2013-08-01 MED ORDER — ONDANSETRON 8 MG/NS 50 ML IVPB
INTRAVENOUS | Status: AC
  Filled 2013-08-01: qty 8

## 2013-08-01 MED ORDER — PROCHLORPERAZINE MALEATE 10 MG PO TABS
10.0000 mg | ORAL_TABLET | Freq: Once | ORAL | Status: AC
Start: 2013-08-01 — End: 2013-08-01
  Administered 2013-08-01: 10 mg via ORAL

## 2013-08-01 MED ORDER — ONDANSETRON 8 MG/50ML IVPB (CHCC)
8.0000 mg | Freq: Once | INTRAVENOUS | Status: AC
  Administered 2013-08-01: 8 mg via INTRAVENOUS

## 2013-08-01 NOTE — Patient Instructions (Signed)
[facility] Discharge Instructions for Patients Receiving Chemotherapy  Today you received the following chemotherapy agents Leucovorin and Adrucil.  To help prevent nausea and vomiting after your treatment, we encourage you to take your nausea medication as prescribed.   If you develop nausea and vomiting that is not controlled by your nausea medication, call the clinic.   BELOW ARE SYMPTOMS THAT SHOULD BE REPORTED IMMEDIATELY:  *FEVER GREATER THAN 100.5 F  *CHILLS WITH OR WITHOUT FEVER  NAUSEA AND VOMITING THAT IS NOT CONTROLLED WITH YOUR NAUSEA MEDICATION  *UNUSUAL SHORTNESS OF BREATH  *UNUSUAL BRUISING OR BLEEDING  TENDERNESS IN MOUTH AND THROAT WITH OR WITHOUT PRESENCE OF ULCERS  *URINARY PROBLEMS  *BOWEL PROBLEMS  UNUSUAL RASH Items with * indicate a potential emergency and should be followed up as soon as possible.  Feel free to call the clinic you have any questions or concerns. The clinic phone number is [phone].

## 2013-08-01 NOTE — Telephone Encounter (Signed)
[name] asked if his appts for 2/9 can be moved up earlier. I have called and left desk RN a message. Wife aware.

## 2013-08-01 NOTE — Progress Notes (Signed)
After Leucovorin finished, and he was flushing, patient vomited approximatley 600 mls. MD notified. Orders received. Continue to observe patient.

## 2013-08-01 NOTE — Telephone Encounter (Signed)
Gave pt appt for lab, md and chemo for February 2015

## 2013-08-01 NOTE — Telephone Encounter (Signed)
Per desk RN Ok to cancel appts on 2/6, keep MD/alb on 2/9. I was able to move up treatment appt to follow MD visit. Patient called and aware.  JMW

## 2013-08-01 NOTE — Progress Notes (Signed)
After Zofran given, patient drank water and chewed on ice chips. Denied nausea and vomiting. Patient discharged home with wife and told to call [phone] if he had any problems when he got home. He and his wife verbalized understanding.

## 2013-08-02 ENCOUNTER — Telehealth: Payer: Self-pay | Admitting: *Deleted

## 2013-08-02 DIAGNOSIS — C20 Malignant neoplasm of rectum: Secondary | ICD-10-CM

## 2013-08-02 NOTE — Telephone Encounter (Signed)
Message copied by Tania Ade on Tue Aug 02, 2013 12:27 PM ------      Message from: Betsy Coder B      Created: Mon Aug 01, 2013  8:00 PM       Please call patient, push fluids, be sure he is taking imodium, check cmet 2/2 ------

## 2013-08-02 NOTE — Telephone Encounter (Signed)
Called patient for chemo follow up. Pt states he did well during night, no further nausea. Pt states that he did start itching during night and this morning, denies rash. Pt states he already spoke with nurse who told him to take benadryl. He states this has helped.

## 2013-08-02 NOTE — Telephone Encounter (Signed)
Message copied by Patton Salles on Tue Aug 02, 2013 11:13 AM ------      Message from: Amelia Jo I      Created: Mon Aug 01, 2013 11:39 AM      Regarding: follow up call       First time Leucovorin and Adrucil. No reaction. Dr. Benay Spice. Per wife, please call home number early morning. ------

## 2013-08-02 NOTE — Telephone Encounter (Signed)
Wife left VM that patient is itching all over since last night. Spoke with patient and then wife regarding itching: no rash and no dyspnea or s/s of allergic reaction. Instructed him to take Benadryl 25 mg every 6 hours as needed. Call if rash develops or call 911 for dyspnea or tongue swelling. Reports not on any new medications.  Told wife to have him push po fluids, especially water due to elevation in his renal functions and to be sure he is taking his Imodium per MD instructions (wife confirms he is). Will recheck his renal functions next week.

## 2013-08-03 ENCOUNTER — Ambulatory Visit (INDEPENDENT_AMBULATORY_CARE_PROVIDER_SITE_OTHER): Payer: Medicare Other | Admitting: General Surgery

## 2013-08-03 ENCOUNTER — Encounter (INDEPENDENT_AMBULATORY_CARE_PROVIDER_SITE_OTHER): Payer: Self-pay | Admitting: General Surgery

## 2013-08-03 ENCOUNTER — Encounter (INDEPENDENT_AMBULATORY_CARE_PROVIDER_SITE_OTHER): Payer: Self-pay

## 2013-08-03 VITALS — BP 120/82 | HR 72 | Temp 97.3°F | Resp 12 | Wt 182.4 lb

## 2013-08-03 DIAGNOSIS — C20 Malignant neoplasm of rectum: Secondary | ICD-10-CM

## 2013-08-03 NOTE — Progress Notes (Signed)
Jason [name] is a 68 y.o. male who is status post a LAR with colo-anal anastomosis on 11/14. His postoperative course was complicated by a prolonged ileus, high output ileostomy, acute on chronic renal failure and malnutrition. He was discharged in late December.  It appears that he has approximately 1-1-1/2 L of output a day. He reports good urine output. He is taking imodium 4 times a day.  His creatinine last week was 2.9 and stable from the week before. He denies any abdominal pain. He denies any rectal pain or rectal discharge. His final pathology revealed a T3 N0 rectal mass.   Objective:  Filed Vitals:   08/03/13 0949  BP: 120/82  Pulse: 72  Temp: 97.3 F (36.3 C)  Resp: 12   General appearance: alert and cooperative  GI: soft, ostomy functioning well  Incision: The posterior portion of his coloanal anastomosis has healed. He does have some mild stenosis and moderate scarring, but I am able to traverse this with my index finger.  Assessment:  s/p  Patient Active Problem List    Diagnosis  Date Noted   .  Rectal cancer  02/01/2013   .  Anemia  07/20/2012   .  Chronic renal insufficiency  07/31/2011   .  EDEMA  09/21/2010   .  OBSTRUCTIVE SLEEP APNEA  07/29/2010   .  GOUT  05/11/2007   .  ERECTILE DYSFUNCTION, MILD  05/11/2007   .  HYPERTENSION  05/11/2007   .  COLONIC POLYPS, HX OF  05/11/2007   Diagnosis  1. Soft tissue, biopsy, circumferential margin  - BENIGN SOFT TISSUE  - NEGATIVE FOR MALIGNANCY.  2. Colon, segmental resection for tumor, distal rectum  - INVASIVE ADENOCARCINOMA WITH NEOADJUVANT RELATED CHANGE, SEE COMMENT.  - TUMOR INVADES INTO PERIRECTAL SOFT TISSUE.  - NO LYMPHOVASCULAR OR PERINEURAL INVASION IS IDENTIFIED.  - FOURTEEN LYMPH NODES, NEGATIVE FOR TUMOR (0/14).  - HYPERPLASTIC POLYP (X1).  - SURGICAL MARGINS, NEGATIVE FOR TUMOR  Plan:  TYLEN LEVERICH Is a 68 year old male who is status post low anterior resection and coloanal anastomosis for a large  rectal tumor. I think he is ready for ostomy reversal.  We will schedule this ASAP.    Marland KitchenRosario Adie, [facility] [place]  07/13/2013  10:12 AM

## 2013-08-03 NOTE — Patient Instructions (Signed)
COntinue imodium 4 times a day

## 2013-08-04 ENCOUNTER — Telehealth: Payer: Self-pay | Admitting: *Deleted

## 2013-08-04 NOTE — Telephone Encounter (Signed)
Patent's wife called regarding appts for 2/9. They wanted to see if they can have lab and chemo earlier, then see Dr. Benay Spice. I have told them that Dr. Benay Spice like to see patients before chemo.

## 2013-08-08 ENCOUNTER — Ambulatory Visit (HOSPITAL_BASED_OUTPATIENT_CLINIC_OR_DEPARTMENT_OTHER): Payer: Medicare Other

## 2013-08-08 ENCOUNTER — Encounter (INDEPENDENT_AMBULATORY_CARE_PROVIDER_SITE_OTHER): Payer: Self-pay

## 2013-08-08 ENCOUNTER — Other Ambulatory Visit (HOSPITAL_BASED_OUTPATIENT_CLINIC_OR_DEPARTMENT_OTHER): Payer: Medicare Other

## 2013-08-08 VITALS — BP 97/73 | HR 83 | Temp 98.1°F | Resp 20

## 2013-08-08 DIAGNOSIS — C20 Malignant neoplasm of rectum: Secondary | ICD-10-CM

## 2013-08-08 DIAGNOSIS — Z5111 Encounter for antineoplastic chemotherapy: Secondary | ICD-10-CM

## 2013-08-08 LAB — CBC WITH DIFFERENTIAL/PLATELET
BASO%: 0.2 % (ref 0.0–2.0)
Basophils Absolute: 0 10*3/uL (ref 0.0–0.1)
EOS%: 8.6 % — AB (ref 0.0–7.0)
Eosinophils Absolute: 0.5 10*3/uL (ref 0.0–0.5)
HEMATOCRIT: 34.5 % — AB (ref 38.4–49.9)
HGB: 11.5 g/dL — ABNORMAL LOW (ref 13.0–17.1)
LYMPH%: 8.5 % — AB (ref 14.0–49.0)
MCH: 27.3 pg (ref 27.2–33.4)
MCHC: 33.3 g/dL (ref 32.0–36.0)
MCV: 81.9 fL (ref 79.3–98.0)
MONO#: 0.3 10*3/uL (ref 0.1–0.9)
MONO%: 5.4 % (ref 0.0–14.0)
NEUT#: 4.8 10*3/uL (ref 1.5–6.5)
NEUT%: 77.3 % — AB (ref 39.0–75.0)
Platelets: 263 10*3/uL (ref 140–400)
RBC: 4.21 10*6/uL (ref 4.20–5.82)
RDW: 14.3 % (ref 11.0–14.6)
WBC: 6.1 10*3/uL (ref 4.0–10.3)
lymph#: 0.5 10*3/uL — ABNORMAL LOW (ref 0.9–3.3)

## 2013-08-08 LAB — COMPREHENSIVE METABOLIC PANEL (CC13)
ALT: 15 U/L (ref 0–55)
AST: 15 U/L (ref 5–34)
Albumin: 3.9 g/dL (ref 3.5–5.0)
Alkaline Phosphatase: 76 U/L (ref 40–150)
Anion Gap: 13 mEq/L — ABNORMAL HIGH (ref 3–11)
BILIRUBIN TOTAL: 0.37 mg/dL (ref 0.20–1.20)
BUN: 55.2 mg/dL — ABNORMAL HIGH (ref 7.0–26.0)
CO2: 25 meq/L (ref 22–29)
Calcium: 11 mg/dL — ABNORMAL HIGH (ref 8.4–10.4)
Chloride: 92 mEq/L — ABNORMAL LOW (ref 98–109)
Creatinine: 3.5 mg/dL (ref 0.7–1.3)
Glucose: 112 mg/dl (ref 70–140)
Potassium: 4.1 mEq/L (ref 3.5–5.1)
SODIUM: 130 meq/L — AB (ref 136–145)
Total Protein: 7.4 g/dL (ref 6.4–8.3)

## 2013-08-08 MED ORDER — PROCHLORPERAZINE MALEATE 10 MG PO TABS
ORAL_TABLET | ORAL | Status: AC
  Filled 2013-08-08: qty 1

## 2013-08-08 MED ORDER — FLUOROURACIL CHEMO INJECTION 2.5 GM/50ML
600.0000 mg/m2 | Freq: Once | INTRAVENOUS | Status: AC
  Administered 2013-08-08: 1200 mg via INTRAVENOUS
  Filled 2013-08-08: qty 24

## 2013-08-08 MED ORDER — PROCHLORPERAZINE MALEATE 10 MG PO TABS
10.0000 mg | ORAL_TABLET | Freq: Once | ORAL | Status: AC
  Administered 2013-08-08: 10 mg via ORAL

## 2013-08-08 MED ORDER — LEUCOVORIN CALCIUM INJECTION 350 MG
500.0000 mg/m2 | Freq: Once | INTRAVENOUS | Status: AC
  Administered 2013-08-08: 980 mg via INTRAVENOUS
  Filled 2013-08-08: qty 49

## 2013-08-08 MED ORDER — SODIUM CHLORIDE 0.9 % IV SOLN
Freq: Once | INTRAVENOUS | Status: AC
  Administered 2013-08-08: 09:00:00 via INTRAVENOUS

## 2013-08-08 NOTE — Patient Instructions (Signed)
[name] Discharge Instructions for Patients Receiving Chemotherapy  Today you received the following chemotherapy agents 5 FU /LEUCOVORIN To help prevent nausea and vomiting after your treatment, we encourage you to take your nausea medication as prescribed. If you develop nausea and vomiting that is not controlled by your nausea medication, call the clinic.   BELOW ARE SYMPTOMS THAT SHOULD BE REPORTED IMMEDIATELY:  *FEVER GREATER THAN 100.5 F  *CHILLS WITH OR WITHOUT FEVER  NAUSEA AND VOMITING THAT IS NOT CONTROLLED WITH YOUR NAUSEA MEDICATION  *UNUSUAL SHORTNESS OF BREATH  *UNUSUAL BRUISING OR BLEEDING  TENDERNESS IN MOUTH AND THROAT WITH OR WITHOUT PRESENCE OF ULCERS  *URINARY PROBLEMS  *BOWEL PROBLEMS  UNUSUAL RASH Items with * indicate a potential emergency and should be followed up as soon as possible.  Feel free to call the clinic you have any questions or concerns. The clinic phone number is [phone].

## 2013-08-09 ENCOUNTER — Telehealth: Payer: Self-pay | Admitting: *Deleted

## 2013-08-09 ENCOUNTER — Telehealth: Payer: Self-pay | Admitting: Family Medicine

## 2013-08-09 DIAGNOSIS — C20 Malignant neoplasm of rectum: Secondary | ICD-10-CM

## 2013-08-09 NOTE — Telephone Encounter (Signed)
Called pt, he reports he is taking Imodium 2 tabs BID. Instructed him to increase to TID and push PO fluids. He voiced understanding.

## 2013-08-09 NOTE — Telephone Encounter (Signed)
Relevant patient education mailed to patient.  

## 2013-08-09 NOTE — Telephone Encounter (Signed)
Message from pt reporting itching and skin peeling. Returned call, he reports the itching has resolved but his skin is dry and peeling "all over". Denies erythema or rash. Stated skin is flaky. Recommended he use heavy emollient cream for his dry skin. Instructed him to push PO fluids.  Reviewed with Dr. Benay Spice, will evaluate at office visit.

## 2013-08-10 ENCOUNTER — Ambulatory Visit (INDEPENDENT_AMBULATORY_CARE_PROVIDER_SITE_OTHER): Payer: Medicare Other | Admitting: Family Medicine

## 2013-08-10 ENCOUNTER — Encounter: Payer: Self-pay | Admitting: Family Medicine

## 2013-08-10 VITALS — BP 120/80 | Temp 98.7°F | Ht 68.0 in | Wt 180.0 lb

## 2013-08-10 DIAGNOSIS — D649 Anemia, unspecified: Secondary | ICD-10-CM

## 2013-08-10 DIAGNOSIS — I10 Essential (primary) hypertension: Secondary | ICD-10-CM

## 2013-08-10 DIAGNOSIS — N189 Chronic kidney disease, unspecified: Secondary | ICD-10-CM

## 2013-08-10 DIAGNOSIS — C20 Malignant neoplasm of rectum: Secondary | ICD-10-CM

## 2013-08-10 DIAGNOSIS — M109 Gout, unspecified: Secondary | ICD-10-CM

## 2013-08-10 DIAGNOSIS — Z0000 Encounter for general adult medical examination without abnormal findings: Secondary | ICD-10-CM

## 2013-08-10 DIAGNOSIS — N400 Enlarged prostate without lower urinary tract symptoms: Secondary | ICD-10-CM

## 2013-08-10 LAB — POCT URINALYSIS DIPSTICK
Bilirubin, UA: NEGATIVE
GLUCOSE UA: NEGATIVE
KETONES UA: NEGATIVE
Leukocytes, UA: NEGATIVE
Nitrite, UA: NEGATIVE
RBC UA: NEGATIVE
UROBILINOGEN UA: 0.2
pH, UA: 5

## 2013-08-10 LAB — HEPATIC FUNCTION PANEL
ALBUMIN: 4 g/dL (ref 3.5–5.2)
ALT: 21 U/L (ref 0–53)
AST: 18 U/L (ref 0–37)
Alkaline Phosphatase: 75 U/L (ref 39–117)
BILIRUBIN DIRECT: 0 mg/dL (ref 0.0–0.3)
TOTAL PROTEIN: 7.1 g/dL (ref 6.0–8.3)
Total Bilirubin: 0.5 mg/dL (ref 0.3–1.2)

## 2013-08-10 LAB — TSH: TSH: 2 u[IU]/mL (ref 0.35–5.50)

## 2013-08-10 LAB — PSA: PSA: 1.86 ng/mL (ref 0.10–4.00)

## 2013-08-10 MED ORDER — ALLOPURINOL 300 MG PO TABS: 300.0000 mg | ORAL_TABLET | Freq: Every morning | ORAL | Status: DC

## 2013-08-10 MED ORDER — CARVEDILOL 6.25 MG PO TABS: 6.2500 mg | ORAL_TABLET | Freq: Two times a day (BID) | ORAL | Status: DC

## 2013-08-10 MED ORDER — DOXAZOSIN MESYLATE 8 MG PO TABS: ORAL_TABLET | ORAL | Status: DC

## 2013-08-10 NOTE — Patient Instructions (Signed)
Increase your water intake.......... 32 ounces daily  Continue your current medications  Followup renal function studies at your next oncology visit  Return to see me for general medical exam in one year sooner if any problem

## 2013-08-10 NOTE — Progress Notes (Signed)
Pre visit review using our clinic review tool, if applicable. No additional management support is needed unless otherwise documented below in the visit note. 

## 2013-08-10 NOTE — Progress Notes (Signed)
   Subjective:    Patient ID: Jason [name], male    DOB: 1945-07-27, 68 y.o.   MRN: [id number]  HPI Jason [name] is a 68 year old married male nonsmoker who comes in today for a Medicare wellness examination  He is in the midst of being treated for rectal cancer. Had surgery in the fall. He's due to have a reversal this winter. He is currently getting chemotherapy by Dr. Cain Sieve at the oncology center.  He takes allopurinol 300 mg daily for gout, Coreg 6.25 mg twice a day for hypertension, Cardura 8 mg daily for BPH  He gets routine eye care, upper and lower dentures, vaccinations up-to-date  Cognitive function normal he walks on a regular basis home health safety reviewed no issues identified, no guns in the house, he does have a health care power of attorney and living well   Review of Systems  Constitutional: Negative.   HENT: Negative.   Eyes: Negative.   Respiratory: Negative.   Cardiovascular: Negative.   Gastrointestinal: Negative.   Genitourinary: Negative.   Musculoskeletal: Negative.   Skin: Negative.   Neurological: Negative.   Psychiatric/Behavioral: Negative.        Objective:   Physical Exam  Nursing note and vitals reviewed. Constitutional: He is oriented to person, place, and time. He appears well-developed and well-nourished. No distress.  HENT:  Head: Normocephalic and atraumatic.  Right Ear: External ear normal.  Left Ear: External ear normal.  Nose: Nose normal.  Mouth/Throat: Oropharynx is clear and moist.  Upper and lower dentures  Eyes: Conjunctivae and EOM are normal. Pupils are equal, round, and reactive to light.  Neck: Normal range of motion. Neck supple. No JVD present. No tracheal deviation present. No thyromegaly present.  Cardiovascular: Normal rate, regular rhythm, normal heart sounds and intact distal pulses.  Exam reveals no gallop and no friction rub.   No murmur heard. No carotid or bruits peripheral pulses 2+ and symmetrical  Pulmonary/Chest:  Effort normal and breath sounds normal. No stridor. No respiratory distress. He has no wheezes. He has no rales. He exhibits no tenderness.  Abdominal: Soft. Bowel sounds are normal. He exhibits no distension and no mass. There is no tenderness. There is no rebound and no guarding.  Colostomy right lower quadrant  Musculoskeletal: Normal range of motion. He exhibits no edema and no tenderness.  Lymphadenopathy:    He has no cervical adenopathy.  Neurological: He is alert and oriented to person, place, and time. He has normal reflexes. No cranial nerve deficit. He exhibits normal muscle tone. Coordination normal.  Skin: Skin is warm and dry. No rash noted. He is not diaphoretic. No erythema. No pallor.  Psychiatric: He has a normal mood and affect. His behavior is normal. Judgment and thought content normal.          Assessment & Plan:  Hypertension at goal continue current therapy  Gout continue allopurinol  BPH continue Cardura 8 mg daily  Rectal cancer currently undergoing chemotherapy with the plans for re\re anastomosis this spring  Chronic renal insufficiency creatinine 3.5 BUN 52 encouraged to drink more water followup renal function in oncology

## 2013-08-11 ENCOUNTER — Other Ambulatory Visit: Payer: Self-pay | Admitting: *Deleted

## 2013-08-12 ENCOUNTER — Telehealth: Payer: Self-pay | Admitting: Family Medicine

## 2013-08-12 ENCOUNTER — Telehealth: Payer: Self-pay | Admitting: *Deleted

## 2013-08-12 ENCOUNTER — Ambulatory Visit: Payer: Medicare Other | Admitting: Oncology

## 2013-08-12 NOTE — Telephone Encounter (Signed)
Patient Information:  Caller Name: [name]  Phone: [phone]  Patient: Jason [name], Jason [name]  Gender: Male  DOB: 19-Apr-1946  Age: 68 Years  PCP: Stevie Kern [facility])  Office Follow Up:  Does the office need to follow up with this patient?: Yes  Instructions For The Office: Caller asking for recommendation from MD about cough medicine to take since on Chemo. If needing to be seen will be glad to make Appt. Please review and call [name] at  [phone].   Symptoms  Reason For Call & Symptoms: Had second chemo treatment on Mon 2/2.  Seen in office on Wed 2/4 and was doing well.  Cough started yesterday pm 2/5 and coughed all night, did not rest well.  Cough is dry hacking.  Reviewed Health History In EMR: Yes  Reviewed Medications In EMR: Yes  Reviewed Allergies In EMR: Yes  Reviewed Surgeries / Procedures: Yes  Date of Onset of Symptoms: 08/11/2013  Guideline(s) Used:  Cough  Disposition Per Guideline:   Home Care  Reason For Disposition Reached:   Cough with no complications  Advice Given:  Prevent Dehydration:  Drink adequate liquids.  This will help soothe an irritated or dry throat and loosen up the phlegm.  Cough Medicines:  OTC Cough Drops: Cough drops can help a lot, especially for mild coughs. They reduce coughing by soothing your irritated throat and removing that tickle sensation in the back of the throat. Cough drops also have the advantage of portability - you can carry them with you.  Home Remedy - Hard Candy: Hard candy works just as well as medicine-flavored OTC cough drops. Diabetics should use sugar-free candy.  Call Back If:  You become worse.  Patient Will Follow Care Advice:  YES

## 2013-08-12 NOTE — Telephone Encounter (Signed)
Wife left VM reporting patient has "bad cough". Wants something called in. Returned call and spoke with patient: He has a cold. No fever. Has not tried any OTC medications yet, was afraid to due to recent chemo. Instructed him to push fluids and OK to take any OTC cold medicine or cough medication he has used in the past. It will not interfere with his chemo.

## 2013-08-12 NOTE — Telephone Encounter (Signed)
Relevant patient education mailed to patient.  

## 2013-08-12 NOTE — Telephone Encounter (Signed)
Spoke with wife and she will call his oncologist

## 2013-08-14 ENCOUNTER — Other Ambulatory Visit: Payer: Self-pay | Admitting: Oncology

## 2013-08-15 ENCOUNTER — Ambulatory Visit (HOSPITAL_BASED_OUTPATIENT_CLINIC_OR_DEPARTMENT_OTHER): Payer: Medicare Other | Admitting: Oncology

## 2013-08-15 ENCOUNTER — Telehealth: Payer: Self-pay | Admitting: Oncology

## 2013-08-15 ENCOUNTER — Other Ambulatory Visit (HOSPITAL_BASED_OUTPATIENT_CLINIC_OR_DEPARTMENT_OTHER): Payer: Medicare Other

## 2013-08-15 ENCOUNTER — Ambulatory Visit (HOSPITAL_BASED_OUTPATIENT_CLINIC_OR_DEPARTMENT_OTHER): Payer: Medicare Other

## 2013-08-15 VITALS — BP 92/51 | HR 85 | Temp 97.5°F | Resp 18 | Ht 68.0 in | Wt 176.1 lb

## 2013-08-15 DIAGNOSIS — C20 Malignant neoplasm of rectum: Secondary | ICD-10-CM

## 2013-08-15 DIAGNOSIS — N189 Chronic kidney disease, unspecified: Secondary | ICD-10-CM

## 2013-08-15 DIAGNOSIS — D649 Anemia, unspecified: Secondary | ICD-10-CM

## 2013-08-15 DIAGNOSIS — I10 Essential (primary) hypertension: Secondary | ICD-10-CM

## 2013-08-15 DIAGNOSIS — Z5111 Encounter for antineoplastic chemotherapy: Secondary | ICD-10-CM

## 2013-08-15 LAB — COMPREHENSIVE METABOLIC PANEL (CC13)
ALBUMIN: 3.6 g/dL (ref 3.5–5.0)
ALK PHOS: 71 U/L (ref 40–150)
ALT: 13 U/L (ref 0–55)
AST: 15 U/L (ref 5–34)
Anion Gap: 13 mEq/L — ABNORMAL HIGH (ref 3–11)
BUN: 33.2 mg/dL — ABNORMAL HIGH (ref 7.0–26.0)
CO2: 29 mEq/L (ref 22–29)
Calcium: 10.3 mg/dL (ref 8.4–10.4)
Chloride: 89 mEq/L — ABNORMAL LOW (ref 98–109)
Creatinine: 3.7 mg/dL (ref 0.7–1.3)
Glucose: 104 mg/dl (ref 70–140)
POTASSIUM: 3.7 meq/L (ref 3.5–5.1)
Sodium: 131 mEq/L — ABNORMAL LOW (ref 136–145)
TOTAL PROTEIN: 7.2 g/dL (ref 6.4–8.3)
Total Bilirubin: 0.33 mg/dL (ref 0.20–1.20)

## 2013-08-15 LAB — CBC WITH DIFFERENTIAL/PLATELET
BASO%: 0.2 % (ref 0.0–2.0)
BASOS ABS: 0 10*3/uL (ref 0.0–0.1)
EOS ABS: 0.1 10*3/uL (ref 0.0–0.5)
EOS%: 2.6 % (ref 0.0–7.0)
HEMATOCRIT: 31.1 % — AB (ref 38.4–49.9)
HEMOGLOBIN: 10.2 g/dL — AB (ref 13.0–17.1)
LYMPH%: 8.1 % — AB (ref 14.0–49.0)
MCH: 27.2 pg (ref 27.2–33.4)
MCHC: 32.8 g/dL (ref 32.0–36.0)
MCV: 82.9 fL (ref 79.3–98.0)
MONO#: 0.7 10*3/uL (ref 0.1–0.9)
MONO%: 13.2 % (ref 0.0–14.0)
NEUT%: 75.9 % — AB (ref 39.0–75.0)
NEUTROS ABS: 3.8 10*3/uL (ref 1.5–6.5)
PLATELETS: 191 10*3/uL (ref 140–400)
RBC: 3.75 10*6/uL — AB (ref 4.20–5.82)
RDW: 14.7 % — ABNORMAL HIGH (ref 11.0–14.6)
WBC: 5.1 10*3/uL (ref 4.0–10.3)
lymph#: 0.4 10*3/uL — ABNORMAL LOW (ref 0.9–3.3)
nRBC: 0 % (ref 0–0)

## 2013-08-15 MED ORDER — PROCHLORPERAZINE MALEATE 10 MG PO TABS
10.0000 mg | ORAL_TABLET | Freq: Once | ORAL | Status: AC
  Administered 2013-08-15: 10 mg via ORAL

## 2013-08-15 MED ORDER — DEXTROSE 5 % IV SOLN
500.0000 mg/m2 | Freq: Once | INTRAVENOUS | Status: AC
  Administered 2013-08-15: 980 mg via INTRAVENOUS
  Filled 2013-08-15: qty 49

## 2013-08-15 MED ORDER — PROCHLORPERAZINE MALEATE 10 MG PO TABS
ORAL_TABLET | ORAL | Status: AC
  Filled 2013-08-15: qty 1

## 2013-08-15 MED ORDER — FLUOROURACIL CHEMO INJECTION 2.5 GM/50ML
600.0000 mg/m2 | Freq: Once | INTRAVENOUS | Status: AC
  Administered 2013-08-15: 1200 mg via INTRAVENOUS
  Filled 2013-08-15: qty 24

## 2013-08-15 MED ORDER — SODIUM CHLORIDE 0.9 % IV SOLN
Freq: Once | INTRAVENOUS | Status: AC
  Administered 2013-08-15: 12:00:00 via INTRAVENOUS

## 2013-08-15 NOTE — Telephone Encounter (Signed)
Gave pt appt for lab and MD, emailed Sharyn Lull regarding chemo, cancelled 2/12 per MD

## 2013-08-15 NOTE — Patient Instructions (Addendum)
[facility] Discharge Instructions for Patients Receiving Chemotherapy  *Your kidney functions are going up again.Marland KitchenMarland KitchenPlease push fluids!!!!  Today you received the following chemotherapy agents: Leucovorin, 5FU  To help prevent nausea and vomiting after your treatment, we encourage you to take your nausea medication as prescribed.   If you develop nausea and vomiting that is not controlled by your nausea medication, call the clinic.   BELOW ARE SYMPTOMS THAT SHOULD BE REPORTED IMMEDIATELY:  *FEVER GREATER THAN 100.5 F  *CHILLS WITH OR WITHOUT FEVER  NAUSEA AND VOMITING THAT IS NOT CONTROLLED WITH YOUR NAUSEA MEDICATION  *UNUSUAL SHORTNESS OF BREATH  *UNUSUAL BRUISING OR BLEEDING  TENDERNESS IN MOUTH AND THROAT WITH OR WITHOUT PRESENCE OF ULCERS  *URINARY PROBLEMS  *BOWEL PROBLEMS  UNUSUAL RASH Items with * indicate a potential emergency and should be followed up as soon as possible.  Feel free to call the clinic you have any questions or concerns. The clinic phone number is [phone].

## 2013-08-15 NOTE — Progress Notes (Signed)
Faxed today's Cmet results to Dr. Elmarie Shiley [facility]) after reviewed by Dr. Learta Codding

## 2013-08-15 NOTE — Progress Notes (Signed)
   [facility]    OFFICE PROGRESS NOTE   INTERVAL HISTORY:   [name] returns for scheduled followup of rectal cancer. He has completed 2 treatments with 5-FU leucovorin. No mouth sores or hand/foot pain. He developed a dry rash over the trunk and extremities last week. He empties the ostomy bag approximately 4 times per day. He is taking Imodium 3 times per day. He reports a good appetite.  Objective:  Vital signs in last 24 hours:  Blood pressure 92/51, pulse 85, temperature 97.5 F (36.4 C), temperature source Oral, resp. rate 18, height 5\' 8"  (1.727 m), weight 176 lb 1.6 oz (79.878 kg), SpO2 100.00%.    HEENT: No thrush or ulcers, the mucous membranes are moist Resp: Lungs clear bilaterally Cardio: Regular rate and rhythm GI: No hepatomegaly, nontender, liquid stool in the ileostomy bag Vascular: No leg edema  Skin: Dry skin over the trunk with flaking. Hyperpigmentation and dryness at the palms and soles. No erythema or skin breakdown.     Lab Results:  Lab Results  Component Value Date   WBC 5.1 08/15/2013   HGB 10.2* 08/15/2013   HCT 31.1* 08/15/2013   MCV 82.9 08/15/2013   PLT 191 08/15/2013   NEUTROABS 3.8 08/15/2013   BUN 33.2, creatinine 3.7, potassium 3.7   Medications: I have reviewed the patient's current medications.  1. AssRectal cancer, clinical stage III (uT3, uN2).  Initiation of concurrent Xeloda and radiation on 02/21/2013, completed 03/31/2013.  CEA normal 04/25/2013  Status post laparoscopic low anterior resection with diverting loop ileostomy 05/23/2013. Final pathology showed a 3.2 cm invasive adenocarcinoma with neoadjuvant related change. Tumor invaded into perirectal soft tissue. There was no lymphovascular or perineural invasion. 14 lymph nodes were negative for tumor. Surgical margins were negative. There was one hyperplastic polyp (ypT3, pN0). Initiation of weekly 5-FU/leucovorin 08/01/2013 2. Markedly elevated pretreat CEA, normal  04/25/2013. 3. Indeterminate pulmonary/subpleural nodules and right hepatic dome lesion. Unchanged on the CT 04/25/2013 4. Renal insufficiency. Acute on chronic renal failure during hospitalization November/December 2014. Now followed by nephrology. 5. Anemia.  6. Hypertension. 7. Gout. 8. Prolonged postoperative ileus following laparoscopic low anterior resection with diverting loop ileostomy 05/23/2013. 9. High output ileostomy. Maintained on Imodium   Assessment/Plan:  Mr. Esguerra appears to be tolerating the 5-FU and leucovorin well. The skin dryness is most likely unrelated to chemotherapy. He continues to have an elevated BUN and creatinine, but he does not appear dehydrated today. I encouraged him to drink increased fluids and continue Imodium. He is scheduled for reversal of the ileostomy next week. He will complete another treatment with 5-FU/leucovorin today.  Disposition:  Mr. Boomer will return for an office visit with the plan to resume adjuvant therapy 09/08/2013   Betsy Coder, MD  08/15/2013  5:47 PM

## 2013-08-16 ENCOUNTER — Telehealth: Payer: Self-pay | Admitting: Oncology

## 2013-08-16 ENCOUNTER — Encounter (HOSPITAL_COMMUNITY): Payer: Self-pay | Admitting: Pharmacy Technician

## 2013-08-16 ENCOUNTER — Telehealth: Payer: Self-pay | Admitting: *Deleted

## 2013-08-16 NOTE — Telephone Encounter (Signed)
Per staff message and POF I have scheduled appts.  JMW  

## 2013-08-16 NOTE — Telephone Encounter (Signed)
Talked to wife and gave them appt for February  and [facility] lab,md and chemo

## 2013-08-18 ENCOUNTER — Encounter (HOSPITAL_COMMUNITY)
Admission: RE | Admit: 2013-08-18 | Discharge: 2013-08-18 | Disposition: A | Discharge status: Home / Self Care | Payer: Medicare Other | Source: Ambulatory Visit | Attending: General Surgery | Admitting: General Surgery

## 2013-08-18 ENCOUNTER — Encounter (HOSPITAL_COMMUNITY): Payer: Self-pay

## 2013-08-18 DIAGNOSIS — Z01818 Encounter for other preprocedural examination: Secondary | ICD-10-CM | POA: Insufficient documentation

## 2013-08-18 NOTE — Progress Notes (Signed)
08-18-13 1010 -Patient had CBC/d,CMP-08-15-13 at [facility] in [place], also due Chemotherapy with repeat labs 08-22-13(Cancer Center), so no labs done per your MD orders today). Please advise [phone](Ameliah Baskins) if you desire labs done anyway or preop AM of surgery. Thanks. Berlinda Last, RN

## 2013-08-18 NOTE — Patient Instructions (Addendum)
[place]  08/18/2013   Your procedure is scheduled on:   2-18 -2015  Report to [facility] at     [place]   AM.  Call this number if you have problems the morning of surgery: [phone]  Or Presurgical Testing [phone](Aadyn Buchheit) For Living Will and/or Health Care Power Attorney Forms: please provide copy for your medical record,may bring AM of surgery(Forms should be already notarized -we do not provide this service).  Remember: Follow any bowel prep instructions per MD office. For Cpap use: Bring mask and tubing only.   Do not eat food:After Midnight.    Take these medicines the morning of surgery with A SIP OF WATER: Carvedilol. Allopurinol.   Do not wear jewelry, make-up or nail polish.  Do not wear lotions, powders, or perfumes. You may wear deodorant.  Do not shave 48 hours(2 days) prior to first CHG shower(legs and under arms).(Shaving face and neck okay.)  Do not bring valuables to the hospital.(Hospital is not responsible for lost valuables).  Contacts, dentures or removable bridgework, body piercing, hair pins may not be worn into surgery.  Leave suitcase in the car. After surgery it may be brought to your room.  For patients admitted to the hospital, checkout time is 11:00 AM the day of discharge.(Restricted visitors-Any Persons displaying flu-like symptoms or illness).    Patients discharged the day of surgery will not be allowed to drive home. Must have responsible person with you x 24 hours once discharged.  Name and phone number of your driver: Davion Flannery [phone] cell  Special Instructions: CHG(Chlorhedine 4%-"Hibiclens","Betasept","Aplicare") Shower Use Special Wash: see special instructions.(avoid face and genitals)      Failure to follow these instructions may result in Cancellation of your surgery.   Patient signature_______________________________________________________

## 2013-08-18 NOTE — Pre-Procedure Instructions (Addendum)
08-18-13 EKG 2'15, CXR 12'14 -Epic. Labs CBC/d, CMP done 08-15-13 Epic, pt. Also to have Labs done 08-22-13 and Chemo tx.[facility]). Note sent to Dr. Marcello Moores to make her aware of this. 08-22-13 3810 Labs done in Epic at Cancer center today- CBC/d - noted. W. Floy Sabina

## 2013-08-22 ENCOUNTER — Ambulatory Visit: Payer: Medicare Other

## 2013-08-22 ENCOUNTER — Encounter (INDEPENDENT_AMBULATORY_CARE_PROVIDER_SITE_OTHER): Payer: Self-pay

## 2013-08-22 ENCOUNTER — Other Ambulatory Visit (HOSPITAL_BASED_OUTPATIENT_CLINIC_OR_DEPARTMENT_OTHER): Payer: Medicare Other

## 2013-08-22 DIAGNOSIS — C20 Malignant neoplasm of rectum: Secondary | ICD-10-CM

## 2013-08-22 LAB — CBC WITH DIFFERENTIAL/PLATELET
BASO%: 0.6 % (ref 0.0–2.0)
BASOS ABS: 0 10*3/uL (ref 0.0–0.1)
EOS%: 3 % (ref 0.0–7.0)
Eosinophils Absolute: 0.2 10*3/uL (ref 0.0–0.5)
HEMATOCRIT: 31.1 % — AB (ref 38.4–49.9)
HEMOGLOBIN: 10 g/dL — AB (ref 13.0–17.1)
LYMPH%: 11.3 % — AB (ref 14.0–49.0)
MCH: 27 pg — ABNORMAL LOW (ref 27.2–33.4)
MCHC: 32.2 g/dL (ref 32.0–36.0)
MCV: 83.8 fL (ref 79.3–98.0)
MONO#: 0.7 10*3/uL (ref 0.1–0.9)
MONO%: 13.4 % (ref 0.0–14.0)
NEUT#: 3.5 10*3/uL (ref 1.5–6.5)
NEUT%: 71.7 % (ref 39.0–75.0)
Platelets: 260 10*3/uL (ref 140–400)
RBC: 3.71 10*6/uL — ABNORMAL LOW (ref 4.20–5.82)
RDW: 15.1 % — ABNORMAL HIGH (ref 11.0–14.6)
WBC: 4.9 10*3/uL (ref 4.0–10.3)
lymph#: 0.6 10*3/uL — ABNORMAL LOW (ref 0.9–3.3)
nRBC: 0 % (ref 0–0)

## 2013-08-22 NOTE — Progress Notes (Signed)
Treatment cancelled today per Dr Benay Spice. Having surgery on Thursday.

## 2013-08-22 NOTE — Progress Notes (Signed)
08-22-13 CBC/d noted-done Cancer center-Chemo cancelled today-due to planned surgery.

## 2013-08-24 ENCOUNTER — Inpatient Hospital Stay (HOSPITAL_COMMUNITY)
Admission: RE | Admit: 2013-08-24 | Discharge: 2013-08-27 | DRG: 345 | Disposition: A | Discharge status: Home Health | Payer: Medicare Other | Source: Ambulatory Visit | Attending: General Surgery | Admitting: General Surgery

## 2013-08-24 ENCOUNTER — Encounter (HOSPITAL_COMMUNITY)
Admission: RE | Disposition: A | Discharge status: Home Health | Payer: Self-pay | Source: Ambulatory Visit | Attending: General Surgery

## 2013-08-24 ENCOUNTER — Encounter (HOSPITAL_COMMUNITY): Payer: Medicare Other | Admitting: Registered Nurse

## 2013-08-24 ENCOUNTER — Encounter (HOSPITAL_COMMUNITY): Payer: Self-pay | Admitting: *Deleted

## 2013-08-24 ENCOUNTER — Inpatient Hospital Stay (HOSPITAL_COMMUNITY): Payer: Medicare Other | Admitting: Registered Nurse

## 2013-08-24 DIAGNOSIS — Z860100 Personal history of colon polyps, unspecified: Secondary | ICD-10-CM

## 2013-08-24 DIAGNOSIS — C20 Malignant neoplasm of rectum: Secondary | ICD-10-CM | POA: Diagnosis present

## 2013-08-24 DIAGNOSIS — G4733 Obstructive sleep apnea (adult) (pediatric): Secondary | ICD-10-CM | POA: Diagnosis present

## 2013-08-24 DIAGNOSIS — I129 Hypertensive chronic kidney disease with stage 1 through stage 4 chronic kidney disease, or unspecified chronic kidney disease: Secondary | ICD-10-CM | POA: Diagnosis present

## 2013-08-24 DIAGNOSIS — Z87891 Personal history of nicotine dependence: Secondary | ICD-10-CM

## 2013-08-24 DIAGNOSIS — Z01812 Encounter for preprocedural laboratory examination: Secondary | ICD-10-CM

## 2013-08-24 DIAGNOSIS — N189 Chronic kidney disease, unspecified: Secondary | ICD-10-CM | POA: Diagnosis present

## 2013-08-24 DIAGNOSIS — Z432 Encounter for attention to ileostomy: Secondary | ICD-10-CM

## 2013-08-24 DIAGNOSIS — N182 Chronic kidney disease, stage 2 (mild): Secondary | ICD-10-CM | POA: Diagnosis present

## 2013-08-24 DIAGNOSIS — K560 Paralytic ileus: Secondary | ICD-10-CM | POA: Diagnosis not present

## 2013-08-24 HISTORY — PX: ILEOSTOMY CLOSURE: SHX1784

## 2013-08-24 LAB — CREATININE, SERUM
CREATININE: 2.02 mg/dL — AB (ref 0.50–1.35)
GFR calc non Af Amer: 33 mL/min — ABNORMAL LOW (ref >90)
GFR, EST AFRICAN AMERICAN: 38 mL/min — AB (ref >90)

## 2013-08-24 SURGERY — CLOSURE, ILEOSTOMY
Anesthesia: General | Site: Abdomen

## 2013-08-24 MED ORDER — HYDROMORPHONE HCL PF 1 MG/ML IJ SOLN
INTRAMUSCULAR | Status: AC
  Filled 2013-08-24: qty 1

## 2013-08-24 MED ORDER — SODIUM CHLORIDE 0.9 % IV SOLN
INTRAVENOUS | Status: DC
  Administered 2013-08-24 – 2013-08-26 (×4): via INTRAVENOUS

## 2013-08-24 MED ORDER — LACTATED RINGERS IV SOLN
INTRAVENOUS | Status: DC | PRN
Start: 2013-08-24 — End: 2013-08-24
  Administered 2013-08-24: 08:00:00 via INTRAVENOUS

## 2013-08-24 MED ORDER — SUCCINYLCHOLINE CHLORIDE 20 MG/ML IJ SOLN
INTRAMUSCULAR | Status: DC | PRN
  Administered 2013-08-24: 100 mg via INTRAVENOUS

## 2013-08-24 MED ORDER — LIDOCAINE HCL (CARDIAC) 20 MG/ML IV SOLN
INTRAVENOUS | Status: DC | PRN
  Administered 2013-08-24: 100 mg via INTRAVENOUS

## 2013-08-24 MED ORDER — DEXAMETHASONE SODIUM PHOSPHATE 10 MG/ML IJ SOLN
INTRAMUSCULAR | Status: AC
  Filled 2013-08-24: qty 1

## 2013-08-24 MED ORDER — CARVEDILOL 6.25 MG PO TABS
6.2500 mg | ORAL_TABLET | Freq: Two times a day (BID) | ORAL | Status: DC
  Administered 2013-08-24 – 2013-08-27 (×6): 6.25 mg via ORAL
  Filled 2013-08-24 (×8): qty 1

## 2013-08-24 MED ORDER — GLYCOPYRROLATE 0.2 MG/ML IJ SOLN
INTRAMUSCULAR | Status: DC | PRN
  Administered 2013-08-24: .6 mg via INTRAVENOUS

## 2013-08-24 MED ORDER — LIDOCAINE HCL (CARDIAC) 20 MG/ML IV SOLN
INTRAVENOUS | Status: AC
  Filled 2013-08-24: qty 5

## 2013-08-24 MED ORDER — PHENYLEPHRINE HCL 10 MG/ML IJ SOLN
INTRAMUSCULAR | Status: DC | PRN
  Administered 2013-08-24: 40 ug via INTRAVENOUS
  Administered 2013-08-24: 80 ug via INTRAVENOUS

## 2013-08-24 MED ORDER — NEOSTIGMINE METHYLSULFATE 1 MG/ML IJ SOLN
INTRAMUSCULAR | Status: AC
  Filled 2013-08-24: qty 10

## 2013-08-24 MED ORDER — DIPHENHYDRAMINE HCL 12.5 MG/5ML PO ELIX
12.5000 mg | ORAL_SOLUTION | Freq: Four times a day (QID) | ORAL | Status: DC | PRN
  Administered 2013-08-25: 12.5 mg via ORAL
  Filled 2013-08-24 (×2): qty 5

## 2013-08-24 MED ORDER — ACETAMINOPHEN 500 MG PO TABS
1000.0000 mg | ORAL_TABLET | Freq: Four times a day (QID) | ORAL | Status: AC
  Administered 2013-08-24 – 2013-08-25 (×3): 1000 mg via ORAL
  Filled 2013-08-24 (×4): qty 2

## 2013-08-24 MED ORDER — ONDANSETRON HCL 4 MG/2ML IJ SOLN
INTRAMUSCULAR | Status: AC
  Filled 2013-08-24: qty 2

## 2013-08-24 MED ORDER — MIDAZOLAM HCL 5 MG/5ML IJ SOLN
INTRAMUSCULAR | Status: DC | PRN
  Administered 2013-08-24 (×2): 1 mg via INTRAVENOUS

## 2013-08-24 MED ORDER — ALLOPURINOL 300 MG PO TABS
300.0000 mg | ORAL_TABLET | Freq: Every morning | ORAL | Status: DC
  Administered 2013-08-25 – 2013-08-27 (×3): 300 mg via ORAL
  Filled 2013-08-24 (×3): qty 1

## 2013-08-24 MED ORDER — ROCURONIUM BROMIDE 100 MG/10ML IV SOLN
INTRAVENOUS | Status: AC
  Filled 2013-08-24: qty 1

## 2013-08-24 MED ORDER — ONDANSETRON HCL 4 MG/2ML IJ SOLN
INTRAMUSCULAR | Status: DC | PRN
  Administered 2013-08-24: 4 mg via INTRAVENOUS

## 2013-08-24 MED ORDER — ALVIMOPAN 12 MG PO CAPS: 12.0000 mg | ORAL_CAPSULE | Freq: Two times a day (BID) | ORAL | Status: DC

## 2013-08-24 MED ORDER — HYDROMORPHONE HCL PF 1 MG/ML IJ SOLN
0.2500 mg | INTRAMUSCULAR | Status: DC | PRN
  Administered 2013-08-24 (×4): 0.5 mg via INTRAVENOUS

## 2013-08-24 MED ORDER — DIPHENHYDRAMINE HCL 50 MG/ML IJ SOLN
INTRAMUSCULAR | Status: AC
  Filled 2013-08-24: qty 1

## 2013-08-24 MED ORDER — FENTANYL CITRATE 0.05 MG/ML IJ SOLN
INTRAMUSCULAR | Status: DC | PRN
  Administered 2013-08-24 (×5): 50 ug via INTRAVENOUS

## 2013-08-24 MED ORDER — PROPOFOL 10 MG/ML IV BOLUS
INTRAVENOUS | Status: AC
  Filled 2013-08-24: qty 20

## 2013-08-24 MED ORDER — MORPHINE SULFATE 2 MG/ML IJ SOLN
2.0000 mg | INTRAMUSCULAR | Status: DC | PRN
  Administered 2013-08-24 – 2013-08-25 (×4): 2 mg via INTRAVENOUS
  Filled 2013-08-24 (×4): qty 1

## 2013-08-24 MED ORDER — ONDANSETRON HCL 4 MG/2ML IJ SOLN: 4.0000 mg | Freq: Four times a day (QID) | INTRAMUSCULAR | Status: DC | PRN

## 2013-08-24 MED ORDER — DEXTROSE 5 % IV SOLN
INTRAVENOUS | Status: AC
  Filled 2013-08-24 (×2): qty 1

## 2013-08-24 MED ORDER — DEXTROSE 5 % IV SOLN
2.0000 g | Freq: Once | INTRAVENOUS | Status: AC
  Administered 2013-08-24: 2 g via INTRAVENOUS
  Filled 2013-08-24: qty 2

## 2013-08-24 MED ORDER — PNEUMOCOCCAL VAC POLYVALENT 25 MCG/0.5ML IJ INJ
0.5000 mL | INJECTION | INTRAMUSCULAR | Status: AC
  Administered 2013-08-26: 0.5 mL via INTRAMUSCULAR
  Filled 2013-08-24 (×2): qty 0.5

## 2013-08-24 MED ORDER — DIPHENHYDRAMINE HCL 50 MG/ML IJ SOLN
12.5000 mg | Freq: Four times a day (QID) | INTRAMUSCULAR | Status: DC | PRN
  Administered 2013-08-24 (×2): 12.5 mg via INTRAVENOUS
  Filled 2013-08-24: qty 1

## 2013-08-24 MED ORDER — DEXTROSE 5 % IV SOLN
2.0000 g | INTRAVENOUS | Status: AC
  Administered 2013-08-24: 2 g via INTRAVENOUS
  Filled 2013-08-24: qty 2

## 2013-08-24 MED ORDER — PROPOFOL 10 MG/ML IV BOLUS
INTRAVENOUS | Status: DC | PRN
  Administered 2013-08-24: 170 mg via INTRAVENOUS

## 2013-08-24 MED ORDER — PROMETHAZINE HCL 25 MG/ML IJ SOLN: 6.2500 mg | INTRAMUSCULAR | Status: DC | PRN

## 2013-08-24 MED ORDER — EPHEDRINE SULFATE 50 MG/ML IJ SOLN
INTRAMUSCULAR | Status: AC
  Filled 2013-08-24: qty 1

## 2013-08-24 MED ORDER — FENTANYL CITRATE 0.05 MG/ML IJ SOLN
INTRAMUSCULAR | Status: AC
  Filled 2013-08-24: qty 5

## 2013-08-24 MED ORDER — ONDANSETRON HCL 4 MG PO TABS: 4.0000 mg | ORAL_TABLET | Freq: Four times a day (QID) | ORAL | Status: DC | PRN

## 2013-08-24 MED ORDER — GLYCOPYRROLATE 0.2 MG/ML IJ SOLN
INTRAMUSCULAR | Status: AC
  Filled 2013-08-24: qty 3

## 2013-08-24 MED ORDER — NEOSTIGMINE METHYLSULFATE 1 MG/ML IJ SOLN
INTRAMUSCULAR | Status: DC | PRN
  Administered 2013-08-24: 4 mg via INTRAVENOUS

## 2013-08-24 MED ORDER — CISATRACURIUM BESYLATE (PF) 10 MG/5ML IV SOLN
INTRAVENOUS | Status: DC | PRN
  Administered 2013-08-24: 4 mg via INTRAVENOUS
  Administered 2013-08-24: 6 mg via INTRAVENOUS

## 2013-08-24 MED ORDER — DOXAZOSIN MESYLATE 8 MG PO TABS
8.0000 mg | ORAL_TABLET | Freq: Every day | ORAL | Status: DC
  Administered 2013-08-24 – 2013-08-26 (×3): 8 mg via ORAL
  Filled 2013-08-24 (×4): qty 1

## 2013-08-24 MED ORDER — DEXAMETHASONE SODIUM PHOSPHATE 10 MG/ML IJ SOLN
INTRAMUSCULAR | Status: DC | PRN
  Administered 2013-08-24: 10 mg via INTRAVENOUS

## 2013-08-24 MED ORDER — ALUM & MAG HYDROXIDE-SIMETH 200-200-20 MG/5ML PO SUSP: 30.0000 mL | Freq: Four times a day (QID) | ORAL | Status: DC | PRN

## 2013-08-24 MED ORDER — SODIUM CHLORIDE 0.9 % IJ SOLN
INTRAMUSCULAR | Status: AC
  Filled 2013-08-24: qty 10

## 2013-08-24 MED ORDER — MIDAZOLAM HCL 2 MG/2ML IJ SOLN
INTRAMUSCULAR | Status: AC
  Filled 2013-08-24: qty 2

## 2013-08-24 MED ORDER — HEPARIN SODIUM (PORCINE) 5000 UNIT/ML IJ SOLN
5000.0000 [IU] | Freq: Three times a day (TID) | INTRAMUSCULAR | Status: DC
  Administered 2013-08-24 – 2013-08-26 (×7): 5000 [IU] via SUBCUTANEOUS
  Filled 2013-08-24 (×11): qty 1

## 2013-08-24 MED ORDER — 0.9 % SODIUM CHLORIDE (POUR BTL) OPTIME
TOPICAL | Status: DC | PRN
  Administered 2013-08-24: 2000 mL

## 2013-08-24 MED ORDER — OXYCODONE-ACETAMINOPHEN 5-325 MG PO TABS
1.0000 | ORAL_TABLET | ORAL | Status: DC | PRN
  Administered 2013-08-24 – 2013-08-26 (×4): 1 via ORAL
  Administered 2013-08-26: 2 via ORAL
  Administered 2013-08-26: 1 via ORAL
  Administered 2013-08-27: 2 via ORAL
  Filled 2013-08-24: qty 1
  Filled 2013-08-24 (×2): qty 2
  Filled 2013-08-24 (×4): qty 1

## 2013-08-24 SURGICAL SUPPLY — 46 items
BLADE EXTENDED COATED 6.5IN (ELECTRODE) IMPLANT
BLADE HEX COATED 2.75 (ELECTRODE) ×2 IMPLANT
CANISTER SUCTION 2500CC (MISCELLANEOUS) ×3 IMPLANT
CHLORAPREP W/TINT 26ML (MISCELLANEOUS) ×3 IMPLANT
COVER MAYO STAND STRL (DRAPES) ×3 IMPLANT
DRAPE LAPAROSCOPIC ABDOMINAL (DRAPES) ×3 IMPLANT
DRAPE LG THREE QUARTER DISP (DRAPES) IMPLANT
DRAPE UTILITY XL STRL (DRAPES) ×3 IMPLANT
DRAPE WARM FLUID 44X44 (DRAPE) ×3 IMPLANT
ELECT REM PT RETURN 9FT ADLT (ELECTROSURGICAL) ×3
ELECTRODE REM PT RTRN 9FT ADLT (ELECTROSURGICAL) ×1 IMPLANT
GLOVE BIO SURGEON STRL SZ 6.5 (GLOVE) ×3 IMPLANT
GLOVE BIO SURGEONS STRL SZ 6.5 (GLOVE) ×2
GLOVE BIOGEL PI IND STRL 7.0 (GLOVE) ×1 IMPLANT
GLOVE BIOGEL PI INDICATOR 7.0 (GLOVE) ×4
GLOVE ECLIPSE 7.5 STRL STRAW (GLOVE) ×4 IMPLANT
GLOVE SURG SS PI 6.5 STRL IVOR (GLOVE) ×2 IMPLANT
GOWN SPEC L3 XXLG W/TWL (GOWN DISPOSABLE) ×10 IMPLANT
GOWN STRL REUS W/TWL XL LVL3 (GOWN DISPOSABLE) ×6 IMPLANT
KIT BASIN OR (CUSTOM PROCEDURE TRAY) ×3 IMPLANT
LEGGING LITHOTOMY PAIR STRL (DRAPES) IMPLANT
LIGASURE IMPACT 36 18CM CVD LR (INSTRUMENTS) IMPLANT
NS IRRIG 1000ML POUR BTL (IV SOLUTION) ×6 IMPLANT
PACK GENERAL/GYN (CUSTOM PROCEDURE TRAY) ×3 IMPLANT
PAD TELFA 2X3 NADH STRL (GAUZE/BANDAGES/DRESSINGS) ×2 IMPLANT
SPONGE GAUZE 4X4 12PLY (GAUZE/BANDAGES/DRESSINGS) ×1 IMPLANT
SPONGE LAP 18X18 X RAY DECT (DISPOSABLE) IMPLANT
STAPLER GUN LINEAR PROX 60 (STAPLE) ×2 IMPLANT
STAPLER PROXIMATE 75MM BLUE (STAPLE) ×2 IMPLANT
STAPLER VISISTAT 35W (STAPLE) ×3 IMPLANT
SUCTION POOLE TIP (SUCTIONS) ×3 IMPLANT
SUT NOVA NAB DX-16 0-1 5-0 T12 (SUTURE) ×4 IMPLANT
SUT PDS AB 1 CTX 36 (SUTURE) IMPLANT
SUT PDS AB 1 TP1 96 (SUTURE) IMPLANT
SUT PROLENE 2 0 BLUE (SUTURE) IMPLANT
SUT SILK 2 0 (SUTURE) ×3
SUT SILK 2 0 SH CR/8 (SUTURE) ×3 IMPLANT
SUT SILK 2 0SH CR/8 30 (SUTURE) IMPLANT
SUT SILK 2-0 18XBRD TIE 12 (SUTURE) ×1 IMPLANT
SUT SILK 2-0 30XBRD TIE 12 (SUTURE) IMPLANT
SUT SILK 3 0 (SUTURE) ×3
SUT SILK 3 0 SH CR/8 (SUTURE) ×3 IMPLANT
SUT SILK 3-0 18XBRD TIE 12 (SUTURE) ×1 IMPLANT
SUT VIC AB 2-0 SH 18 (SUTURE) ×2 IMPLANT
TOWEL OR 17X26 10 PK STRL BLUE (TOWEL DISPOSABLE) ×6 IMPLANT
TOWEL OR NON WOVEN STRL DISP B (DISPOSABLE) ×6 IMPLANT

## 2013-08-24 NOTE — Anesthesia Preprocedure Evaluation (Addendum)
Anesthesia Evaluation  Patient identified by MRN, date of birth, ID band Patient awake    Reviewed: Allergy & Precautions, H&P , NPO status , Patient's Chart, lab work & pertinent test results  Airway Mallampati: II TM Distance: >3 FB Neck ROM: Full    Dental no notable dental hx.    Pulmonary sleep apnea , former smoker,  breath sounds clear to auscultation  Pulmonary exam normal       Cardiovascular hypertension, Pt. on medications Rhythm:Regular Rate:Normal     Neuro/Psych negative neurological ROS  negative psych ROS   GI/Hepatic negative GI ROS, Neg liver ROS,   Endo/Other  negative endocrine ROS  Renal/GU Renal InsufficiencyRenal disease  negative genitourinary   Musculoskeletal negative musculoskeletal ROS (+)   Abdominal   Peds negative pediatric ROS (+)  Hematology negative hematology ROS (+)   Anesthesia Other Findings   Reproductive/Obstetrics negative OB ROS                          Anesthesia Physical Anesthesia Plan  ASA: III  Anesthesia Plan: General   Post-op Pain Management:    Induction: Intravenous  Airway Management Planned: Oral ETT  Additional Equipment:   Intra-op Plan:   Post-operative Plan: Extubation in OR  Informed Consent: I have reviewed the patients History and Physical, chart, labs and discussed the procedure including the risks, benefits and alternatives for the proposed anesthesia with the patient or authorized representative who has indicated his/her understanding and acceptance.   Dental advisory given  Plan Discussed with: CRNA and Surgeon  Anesthesia Plan Comments:         Anesthesia Quick Evaluation

## 2013-08-24 NOTE — Transfer of Care (Signed)
Immediate Anesthesia Transfer of Care Note  Patient: Jason [name]  Procedure(s) Performed: Procedure(s): CLOSURE OF LOOP ILEOSTOMY  (N/A)  Patient Location: PACU  Anesthesia Type:General  Level of Consciousness: awake, alert , oriented and patient cooperative  Airway & Oxygen Therapy: Patient Spontanous Breathing and Patient connected to face mask oxygen  Post-op Assessment: Report given to PACU RN, Post -op Vital signs reviewed and stable and Patient moving all extremities  Post vital signs: Reviewed and stable  Complications: No apparent anesthesia complications

## 2013-08-24 NOTE — Op Note (Signed)
08/24/2013  10:09 AM  PATIENT:  Jason [name]  68 y.o. male  Patient Care Team: [name], MD as PCP - General Lafayette Dragon, MD as Attending Physician (Gastroenterology) Leighton Ruff, MD as Consulting Physician (General Surgery) [name], MD as Consulting Physician (Oncology) Carola Frost, RN as Registered Nurse  PRE-OPERATIVE DIAGNOSIS:  rectal cancer, diverting ileostomy, CRI  POST-OPERATIVE DIAGNOSIS:  rectal cancer, diverting ileostomy, CRI  PROCEDURE:  CLOSURE OF LOOP ILEOSTOMY   Surgeon(s): Leighton Ruff, MD Harl Bowie, MD  ASSISTANT: Ninfa Linden   ANESTHESIA:   general  EBL:  Total I/O In: 1000 [I.V.:1000] Out: 300 [Urine:250; Blood:50]  DRAINS: none   SPECIMEN:  Source of Specimen:  ileostomy  DISPOSITION OF SPECIMEN:  PATHOLOGY  COUNTS:  YES  PLAN OF CARE: Admit to inpatient   PATIENT DISPOSITION:  PACU - hemodynamically stable.  INDICATION: Is a 68 year old male who is status post proctectomy and coloanal anastomosis. This anastomosis was protected by a loop ileostomy. His hospital stay was complicated by prolonged ileus and worsening of his chronic renal failure. Since he has been home his creatinine has elevated at least one point. He also has some electrolyte imbalances. It was decided to reverse his ileostomy to hopefully help assist with his renal function.   OR FINDINGS: Loop ileostomy, loose filmy adhesions noted intra-abdominally  DESCRIPTION: the patient was identified in the preoperative holding area and taken to the OR where they were laid Supine on the operating room table.  Gen. anesthesia was induced without difficulty. SCDs were also noted to be in place prior to the initiation of anesthesia.  A Foley catheter was inserted under sterile conditions. The patient was then prepped and draped in the usual sterile fashion.    A surgical timeout was performed indicating the correct patient, procedure, positioning and need for  preoperative antibiotics.   I began by making an incision using Bovie electrocautery at the mucocutaneous junction of the ostomy. Dissection was carried down through the subcutaneous tissues until the fascia was encountered. The ostomy was carefully separated from the fascia and rectus muscle. It was freed from the peritoneum. After this was completed the 2 loops were brought up. I saw the abdominal wall and there were no adhesions noted around the ostomy site. A GIA blue load; millimeter stapler was used to create a new anastomosis just distal to the ostomy opening. After this was completed a TA stapler was used to close the common enterotomy channel. Hemostasis was good. There was one bleeding vessel in the mesentery that was sutured using a 2-0 silk suture. An anti-tension suture was placed in the crotch of the anastomosis. The anastomosis was then placed back into the abdomen. I did have to open the fascia a few millimeters to allow for the ostomy to be placed back into the abdomen safely. After this was completed the fascia was elevated using 2 Kocher clamps. The fascia was then closed using interrupted #1 Novafil sutures. A 2-0 Vicryl pursestring suture was used to close the subcutaneous tissues and dermal tissues. A Telfa wick was placed in the middle of the wound, and a sterile dressing was placed over this.  The patient was then awakened from anesthesia and sent to the postanesthesia care unit in stable condition. All counts were correct per operating room staff.

## 2013-08-24 NOTE — H&P (Signed)
HPI:  this is a 68 y.o. M with rectal cancer who is s/p coloanal anastomosis.  His hospital stay was complicated by prolonged ileus, high output ileostomy and worsening of his chronic renal failure.  Past Medical History  Diagnosis Date  . Gout   . Hypertension   . Hyperplastic colon polyp   . Diverticulosis   . Anemia   . Arthritis   . ED (erectile dysfunction)   . Chronic renal insufficiency, stage II (mild)   . History of radiation therapy 02/21/13-03/31/13    rectum 50.4Gy total dose  . rectal ca dx'd 02/01/13    rectal. Radiation and chemotherapy- remains on chemotherapy-next tx. 08-22-13.     Past Surgical History  Procedure Laterality Date  . Colonoscopy  2010    [facility]  . Eus N/A 02/03/2013    Procedure: LOWER ENDOSCOPIC ULTRASOUND (EUS);  Surgeon: Milus Banister, MD;  Location: [facility];  Service: Endoscopy;  Laterality: N/A;  . Laparoscopic low anterior resection N/A 05/20/2013    Procedure: LAPAROSCOPIC LOW ANTERIOR RESECTION WITH SPLENIC FLEXURE MOBILIZATION;  Surgeon: Leighton Ruff, MD;  Location: [facility];  Service: General;  Laterality: N/A;  . Ostomy N/A 05/20/2013    Procedure: diverting OSTOMY;  Surgeon: Leighton Ruff, MD;  Location: [facility];  Service: General;  Laterality: N/A;  . Colon surgery  05/20/2013      Medication List    ASK your doctor about these medications       acetaminophen 325 MG tablet  Commonly known as:  TYLENOL  Take 2 tablets (650 mg total) by mouth every 4 (four) hours as needed for moderate pain.     allopurinol 300 MG tablet  Commonly known as:  ZYLOPRIM  Take 1 tablet (300 mg total) by mouth every morning.     carvedilol 6.25 MG tablet  Commonly known as:  COREG  Take 1 tablet (6.25 mg total) by mouth 2 (two) times daily with a meal.     doxazosin 8 MG tablet  Commonly known as:  CARDURA  Take 8 mg by mouth at bedtime.     loperamide 2 MG capsule  Commonly known as:  IMODIUM  Take 1 capsule (2 mg total) by mouth 3  (three) times daily before meals.     polycarbophil 625 MG tablet  Commonly known as:  FIBERCON  Take 1 tablet (625 mg total) by mouth daily.       Allergies  Allergen Reactions  . Nsaids     Asked by surgeon to add this medication class as intolerance due to patients renal insufficiency.   Family History  Problem Relation Age of Onset  . Hypertension Other   . Stroke Mother   . Stroke Father   . Colon cancer Neg Hx    History   Social History  . Marital Status: Married    Spouse Name: [name]    Number of Children: 2  . Years of Education: N/A   Occupational History  .      retired Insurance account manager   Social History Main Topics  . Smoking status: Former Smoker -- 0.50 packs/day for 10 years    Quit date: 07/07/1977  . Smokeless tobacco: Never Used  . Alcohol Use: No     Comment: pint of whisky every 2 weeks and occasional beer until about a month ago-Quit 10'14   . Drug Use: No  . Sexual Activity: Not on file   Other Topics Concern  . Not on file  Social History Narrative   Married to wife, [name]   Retired Insurance account manager w/[employer]   #2 grown children and #6 grandchildren   Review of Systems - General ROS: negative for - chills or fever Respiratory ROS: no cough, shortness of breath, or wheezing Cardiovascular ROS: no chest pain or dyspnea on exertion Gastrointestinal ROS: no abdominal pain, change in bowel habits, or black or bloody stools Genito-Urinary ROS: no dysuria, trouble voiding, or hematuria  Filed Vitals:   08/24/13 0602  BP: 104/74  Pulse: 104  Temp: 98.2 F (36.8 C)  Resp: 18   Physical Exam  Constitutional: He appears well-developed and well-nourished.  HENT:  Head: Normocephalic and atraumatic.  Eyes: EOM are normal. Pupils are equal, round, and reactive to light.  Neck: Normal range of motion. Neck supple.  Cardiovascular: Normal rate and regular rhythm.   Pulmonary/Chest: Effort normal.  Abdominal:  Soft.  Musculoskeletal: Normal range of motion.   Assessment and Plan: His diverting ileostomy seems to be contributing to his renal failure issues at least with electrolyte imbalances if not dehydration.  We will reverse this today.  We discussed the risked of the procedure, which are bleeding, ileus, infection and worsening renal function.

## 2013-08-24 NOTE — Preoperative (Signed)
Beta Blockers   Reason not to administer Beta Blockers:Coreg taken 0500 08-24-13

## 2013-08-24 NOTE — Anesthesia Postprocedure Evaluation (Signed)
  Anesthesia Post-op Note  Patient: Jason [name]  Procedure(s) Performed: Procedure(s) (LRB): CLOSURE OF LOOP ILEOSTOMY  (N/A)  Patient Location: PACU  Anesthesia Type: General  Level of Consciousness: awake and alert   Airway and Oxygen Therapy: Patient Spontanous Breathing  Post-op Pain: mild  Post-op Assessment: Post-op Vital signs reviewed, Patient's Cardiovascular Status Stable, Respiratory Function Stable, Patent Airway and No signs of Nausea or vomiting  Last Vitals:  Filed Vitals:   08/24/13 1040  BP:   Pulse: 82  Temp:   Resp: 13    Post-op Vital Signs: stable   Complications: No apparent anesthesia complications

## 2013-08-25 ENCOUNTER — Encounter (HOSPITAL_COMMUNITY): Payer: Self-pay | Admitting: General Surgery

## 2013-08-25 LAB — BASIC METABOLIC PANEL
BUN: 23 mg/dL (ref 6–23)
CALCIUM: 9.8 mg/dL (ref 8.4–10.5)
CO2: 27 mEq/L (ref 19–32)
Chloride: 97 mEq/L (ref 96–112)
Creatinine, Ser: 1.78 mg/dL — ABNORMAL HIGH (ref 0.50–1.35)
GFR calc Af Amer: 44 mL/min — ABNORMAL LOW (ref >90)
GFR calc non Af Amer: 38 mL/min — ABNORMAL LOW (ref >90)
GLUCOSE: 101 mg/dL — AB (ref 70–99)
Potassium: 4.5 mEq/L (ref 3.7–5.3)
Sodium: 137 mEq/L (ref 137–147)

## 2013-08-25 LAB — CBC
HCT: 26.3 % — ABNORMAL LOW (ref 39.0–52.0)
Hemoglobin: 8.4 g/dL — ABNORMAL LOW (ref 13.0–17.0)
MCH: 26.9 pg (ref 26.0–34.0)
MCHC: 31.9 g/dL (ref 30.0–36.0)
MCV: 84.3 fL (ref 78.0–100.0)
PLATELETS: 288 10*3/uL (ref 150–400)
RBC: 3.12 MIL/uL — AB (ref 4.22–5.81)
RDW: 15.3 % (ref 11.5–15.5)
WBC: 9.7 10*3/uL (ref 4.0–10.5)

## 2013-08-25 NOTE — Progress Notes (Signed)
1 Day Post-Op ileostomy reversal Subjective: Pt doing well.  Min pain.  Ambulating.  Foley out  Objective: Vital signs in last 24 hours: Temp:  [97.9 F (36.6 C)-98.5 F (36.9 C)] 98.5 F (36.9 C) (02/19 0630) Pulse Rate:  [60-93] 68 (02/19 0630) Resp:  [12-21] 18 (02/19 0630) BP: (120-161)/(67-88) 151/80 mmHg (02/19 0630) SpO2:  [96 %-100 %] 98 % (02/19 0630) Weight:  [179 lb 2 oz (81.251 kg)] 179 lb 2 oz (81.251 kg) (02/18 1300)   Intake/Output from previous day: 02/18 0701 - 02/19 0700 In: 3100 [I.V.:3100] Out: 2260 [Urine:2210; Blood:50] Intake/Output this shift:     General appearance: alert and cooperative GI: normal findings: soft, non-tender  Incision: no significant drainage  Lab Results:   Recent Labs  08/25/13 0423  WBC 9.7  HGB 8.4*  HCT 26.3*  PLT 288   BMET  Recent Labs  08/24/13 1237 08/25/13 0423  NA  --  137  K  --  4.5  CL  --  97  CO2  --  27  GLUCOSE  --  101*  BUN  --  23  CREATININE 2.02* 1.78*  CALCIUM  --  9.8   PT/INR No results found for this basename: LABPROT, INR,  in the last 72 hours ABG No results found for this basename: PHART, PCO2, PO2, HCO3,  in the last 72 hours  MEDS, Scheduled . acetaminophen  1,000 mg Oral 4 times per day  . allopurinol  300 mg Oral q morning - 10a  . carvedilol  6.25 mg Oral BID WC  . doxazosin  8 mg Oral QHS  . heparin subcutaneous  5,000 Units Subcutaneous 3 times per day  . pneumococcal 23 valent vaccine  0.5 mL Intramuscular Tomorrow-1000    Studies/Results: No results found.  Assessment: s/p Procedure(s): CLOSURE OF LOOP ILEOSTOMY  Patient Active Problem List   Diagnosis Date Noted  . Rectal cancer 02/01/2013  . Anemia 07/20/2012  . Chronic renal insufficiency 07/31/2011  . EDEMA 09/21/2010  . OBSTRUCTIVE SLEEP APNEA 07/29/2010  . GOUT 05/11/2007  . ERECTILE DYSFUNCTION, MILD 05/11/2007  . HYPERTENSION 05/11/2007  . COLONIC POLYPS, HX OF 05/11/2007    Expected post op  course  Plan: d/c foley Cont NPO with sips of clears Cont to ambulate Will advance diet once pt has some bowel function   LOS: 1 day     .Rosario Adie, [facility], [place]   08/25/2013 9:06 AM

## 2013-08-25 NOTE — Clinical Documentation Improvement (Signed)
Presents for take down of ileostomy; history of CKD.   Black male  GFR range this admission is 93 - 44  Please clarify the stage of CKD the patient has in your next progress note and discharge summary!   _______CKD Stage I - GFR > OR = 90 _______CKD Stage II - GFR 60-80 _______CKD Stage III - GFR 30-59 _______CKD Stage IV - GFR 15-29 _______CKD Stage V - GFR < 15 _______ESRD (End Stage Renal Disease) _______Other condition_____________    Angela Adam ,RN Clinical Documentation Specialist:  [phone] ; Cell [phone]  [facility]- Health Information Management

## 2013-08-25 NOTE — Care Management Note (Signed)
    Page 1 of 1   08/25/2013     11:00:32 AM   CARE MANAGEMENT NOTE 08/25/2013  Patient:  Jason [name], Jason [name]   Account Number:  [id number]  Date Initiated:  08/25/2013  Documentation initiated by:  Sunday Spillers  Subjective/Objective Assessment:   68 yo male admitted s/p ileostomy takedown. PTA lived at home with spouse.     Action/Plan:   Home when stable   Anticipated DC Date:  08/28/2013   Anticipated DC Plan:  [place]  CM consult      Choice offered to / List presented to:             Status of service:  Completed, signed off Medicare Important Message given?   (If response is "NO", the following Medicare IM given date fields will be blank) Date Medicare IM given:   Date Additional Medicare IM given:    Discharge Disposition:  HOME/SELF CARE  Per UR Regulation:  Reviewed for med. necessity/level of care/duration of stay  If discussed at [place] of Stay Meetings, dates discussed:    Comments:

## 2013-08-26 LAB — BASIC METABOLIC PANEL
BUN: 20 mg/dL (ref 6–23)
CALCIUM: 9.7 mg/dL (ref 8.4–10.5)
CO2: 26 mEq/L (ref 19–32)
CREATININE: 1.65 mg/dL — AB (ref 0.50–1.35)
Chloride: 102 mEq/L (ref 96–112)
GFR calc non Af Amer: 42 mL/min — ABNORMAL LOW (ref >90)
GFR, EST AFRICAN AMERICAN: 48 mL/min — AB (ref >90)
Glucose, Bld: 77 mg/dL (ref 70–99)
Potassium: 4 mEq/L (ref 3.7–5.3)
Sodium: 140 mEq/L (ref 137–147)

## 2013-08-26 LAB — CBC
HCT: 26.2 % — ABNORMAL LOW (ref 39.0–52.0)
Hemoglobin: 8.3 g/dL — ABNORMAL LOW (ref 13.0–17.0)
MCH: 27.1 pg (ref 26.0–34.0)
MCHC: 31.7 g/dL (ref 30.0–36.0)
MCV: 85.6 fL (ref 78.0–100.0)
Platelets: 272 10*3/uL (ref 150–400)
RBC: 3.06 MIL/uL — AB (ref 4.22–5.81)
RDW: 15.7 % — AB (ref 11.5–15.5)
WBC: 5.2 10*3/uL (ref 4.0–10.5)

## 2013-08-26 MED ORDER — OXYCODONE-ACETAMINOPHEN 5-325 MG PO TABS: 1.0000 | ORAL_TABLET | ORAL | Status: DC | PRN

## 2013-08-26 MED ORDER — LOPERAMIDE HCL 2 MG PO CAPS: 2.0000 mg | ORAL_CAPSULE | ORAL | Status: DC | PRN

## 2013-08-26 NOTE — Progress Notes (Signed)
2 Days Post-Op ileostomy reversal Subjective: Pt doing well.  Min pain.  Ambulating.  Having bowel function.  Cr coming down.    Objective: Vital signs in last 24 hours: Temp:  [97.5 F (36.4 C)-98.1 F (36.7 C)] 97.9 F (36.6 C) (02/20 0614) Pulse Rate:  [73-105] 105 (02/20 0614) Resp:  [18] 18 (02/20 0614) BP: (143-152)/(74-85) 144/75 mmHg (02/20 0614) SpO2:  [98 %-100 %] 99 % (02/20 0614)   Intake/Output from previous day: 02/19 0701 - 02/20 0700 In: 1725 [I.V.:1725] Out: 2000 [Urine:1600; Stool:400] Intake/Output this shift:     General appearance: alert and cooperative GI: normal findings: soft, non-tender  Incision: clean, dry, draining appropriately.    Lab Results:   Recent Labs  08/25/13 0423 08/26/13 0425  WBC 9.7 5.2  HGB 8.4* 8.3*  HCT 26.3* 26.2*  PLT 288 272   BMET  Recent Labs  08/25/13 0423 08/26/13 0425  NA 137 140  K 4.5 4.0  CL 97 102  CO2 27 26  GLUCOSE 101* 77  BUN 23 20  CREATININE 1.78* 1.65*  CALCIUM 9.8 9.7   PT/INR No results found for this basename: LABPROT, INR,  in the last 72 hours ABG No results found for this basename: PHART, PCO2, PO2, HCO3,  in the last 72 hours  MEDS, Scheduled . allopurinol  300 mg Oral q morning - 10a  . carvedilol  6.25 mg Oral BID WC  . doxazosin  8 mg Oral QHS  . heparin subcutaneous  5,000 Units Subcutaneous 3 times per day  . pneumococcal 23 valent vaccine  0.5 mL Intramuscular Tomorrow-1000    Studies/Results: No results found.  Assessment: s/p Procedure(s): CLOSURE OF LOOP ILEOSTOMY  Patient Active Problem List   Diagnosis Date Noted  . Rectal cancer 02/01/2013  . Anemia 07/20/2012  . Chronic renal insufficiency 07/31/2011  . EDEMA 09/21/2010  . OBSTRUCTIVE SLEEP APNEA 07/29/2010  . GOUT 05/11/2007  . ERECTILE DYSFUNCTION, MILD 05/11/2007  . HYPERTENSION 05/11/2007  . COLONIC POLYPS, HX OF 05/11/2007    Expected post op course  Plan: Advance to reg diet Cont to  ambulate D/c tom if he does well today   LOS: 2 days     .Rosario Adie, [facility], [place]   08/26/2013 10:29 AM

## 2013-08-26 NOTE — Discharge Instructions (Signed)
ABDOMINAL SURGERY: POST OP INSTRUCTIONS  1. DIET: Follow a light bland diet the first 24 hours after arrival home, such as soup, liquids, crackers, etc.  Be sure to include lots of fluids daily.  Avoid fast food or heavy meals as your are more likely to get nauseated.  Eat a low fat the next few days after surgery.   2. Take your usually prescribed home medications unless otherwise directed. 3. PAIN CONTROL: a. Pain is best controlled by a usual combination of three different methods TOGETHER: i. Ice/Heat ii. Over the counter pain medication iii. Prescription pain medication b. Most patients will experience some swelling and bruising around the incisions.  Ice packs or heating pads (30-60 minutes up to 6 times a day) will help. Use ice for the first few days to help decrease swelling and bruising, then switch to heat to help relax tight/sore spots and speed recovery.  Some people prefer to use ice alone, heat alone, alternating between ice & heat.  Experiment to what works for you.  Swelling and bruising can take several weeks to resolve.   c. It is helpful to take an over-the-counter pain medication regularly for the first few weeks.  Choose one of the following that works best for you: i. Acetaminophen (Tylenol, etc) 500-650mg  four times a day (every meal & bedtime) d. A  prescription for pain medication (such as oxycodone, hydrocodone, etc) should be given to you upon discharge.  Take your pain medication as prescribed.  i. If you are having problems/concerns with the prescription medicine (does not control pain, nausea, vomiting, rash, itching, etc), please call us [phone] to see if we need to switch you to a different pain medicine that will work better for you and/or control your side effect better. ii. If you need a refill on your pain medication, please contact your pharmacy.  They will contact our office to request authorization. Prescriptions will not be filled after 5 pm or on  week-ends. 4. Avoid getting constipated.  Between the surgery and the pain medications, it is common to experience some constipation.  Increasing fluid intake and taking a fiber supplement (such as Metamucil, Citrucel, FiberCon, MiraLax, etc) 1-2 times a day regularly will usually help prevent this problem from occurring.  A mild laxative (prune juice, Milk of Magnesia, MiraLax, etc) should be taken according to package directions if there are no bowel movements after 48 hours.   5. Watch out for diarrhea.  If you have many loose bowel movements, simplify your diet to bland foods & liquids for a few days.  Stop any stool softeners and decrease your fiber supplement.  Switching to mild anti-diarrheal medications (Kayopectate, Pepto Bismol) can help.  If this worsens or does not improve, please call us. 6. Wash / shower every day.  You may shower over the incision / wound.  Avoid baths until the skin is fully healed.  Continue to shower over incision(s) after the dressing is off. 7. Change your dressing daily.   8. ACTIVITIES as tolerated:   a. You may resume regular (light) daily activities beginning the next day--such as daily self-care, walking, climbing stairs--gradually increasing activities as tolerated.  If you can walk 30 minutes without difficulty, it is safe to try more intense activity such as jogging, treadmill, bicycling, low-impact aerobics, swimming, etc. b. Save the most intensive and strenuous activity for last such as sit-ups, heavy lifting, contact sports, etc  Refrain from any heavy lifting or straining until you are off  narcotics for pain control.   c. DO NOT PUSH THROUGH PAIN.  Let pain be your guide: If it hurts to do something, don't do it.  Pain is your body warning you to avoid that activity for another week until the pain goes down. d. You may drive when you are no longer taking prescription pain medication, you can comfortably wear a seatbelt, and you can safely maneuver your car  and apply brakes. e. Dennis Bast may have sexual intercourse when it is comfortable.  9. FOLLOW UP in our office a. Please call [facility] at [phone] to set up an appointment to see your surgeon in the office for a follow-up appointment approximately 1-2 weeks after your surgery. b. Make sure that you call for this appointment the day you arrive home to insure a convenient appointment time. 10. IF YOU HAVE DISABILITY OR FAMILY LEAVE FORMS, BRING THEM TO THE OFFICE FOR PROCESSING.  DO NOT GIVE THEM TO YOUR DOCTOR.   WHEN TO CALL us [phone]: 1. Poor pain control 2. Reactions / problems with new medications (rash/itching, nausea, etc)  3. Fever over 101.5 F (38.5 C) 4. Inability to urinate 5. Nausea and/or vomiting 6. Worsening swelling or bruising 7. Continued bleeding from incision. 8. Increased pain, redness, or drainage from the incision  The clinic staff is available to answer your questions during regular business hours (8:30am-5pm).  Please dont hesitate to call and ask to speak to one of our nurses for clinical concerns.   A surgeon from [facility] is always on call at the hospitals   If you have a medical emergency, go to the nearest emergency room or call 911.    [facility], [place], [place], [place], [place]  [address] ? MAIN: [phone] ? TOLL FREE: [phone] ? FAX [phone] [email or web address]

## 2013-08-27 LAB — BASIC METABOLIC PANEL
BUN: 18 mg/dL (ref 6–23)
CHLORIDE: 105 meq/L (ref 96–112)
CO2: 26 meq/L (ref 19–32)
Calcium: 9.2 mg/dL (ref 8.4–10.5)
Creatinine, Ser: 1.8 mg/dL — ABNORMAL HIGH (ref 0.50–1.35)
GFR calc Af Amer: 44 mL/min — ABNORMAL LOW (ref >90)
GFR calc non Af Amer: 38 mL/min — ABNORMAL LOW (ref >90)
Glucose, Bld: 91 mg/dL (ref 70–99)
POTASSIUM: 3.8 meq/L (ref 3.7–5.3)
SODIUM: 142 meq/L (ref 137–147)

## 2013-08-27 LAB — CBC
HEMATOCRIT: 24.6 % — AB (ref 39.0–52.0)
HEMOGLOBIN: 7.8 g/dL — AB (ref 13.0–17.0)
MCH: 27.3 pg (ref 26.0–34.0)
MCHC: 31.7 g/dL (ref 30.0–36.0)
MCV: 86 fL (ref 78.0–100.0)
Platelets: 261 10*3/uL (ref 150–400)
RBC: 2.86 MIL/uL — AB (ref 4.22–5.81)
RDW: 15.8 % — AB (ref 11.5–15.5)
WBC: 5.4 10*3/uL (ref 4.0–10.5)

## 2013-08-27 MED ORDER — HYDROCODONE-ACETAMINOPHEN 5-325 MG PO TABS: 1.0000 | ORAL_TABLET | Freq: Four times a day (QID) | ORAL | Status: DC | PRN

## 2013-08-27 NOTE — Discharge Summary (Signed)
Physician Discharge Summary  Patient ID: Jason [name] MRN: [id number] DOB/AGE: April 24, 1946 68 y.o.  Admit date: 08/24/2013 Discharge date: 08/27/2013  Admission Diagnoses:  Ileostomy after rectal cancer surgery  Discharge Diagnoses:  Same; post takedown of ileostomy  Active Problems:   Chronic renal insufficiency   Rectal cancer   Surgery:  Takedown of ileostomy  Discharged Condition: improved  Hospital Course:   Had surgery.  Ileus resolved and passed stool per recturm.  Taking diet.  Wanting to go home.   Consults: none  Significant Diagnostic Studies: none    Discharge Exam: Blood pressure 124/75, pulse 75, temperature 98.3 F (36.8 C), temperature source Oral, resp. rate 18, height 5\' 8"  (1.727 m), weight 179 lb 2 oz (81.251 kg), SpO2 99.00%. Small wound clean.  Will do daily dressing changes   Disposition: 06-Home-Health Care Svc  Discharge Orders   Future Appointments Provider Department Dept Phone   09/08/2013 9:15 AM [facility] [phone]   09/08/2013 9:45 AM [name], MD [facility] [phone]   09/08/2013 10:45 AM [facility] [phone]   03/15/8337 2:50 AM Leighton Ruff, MD [facility], [place]   09/15/2013 9:15 AM [facility] [phone]   09/15/2013 9:45 AM [facility] [phone]   09/22/2013 9:15 AM [facility] [phone]   09/22/2013 9:45 AM [name], MD [facility] [phone]   09/22/2013 10:45 AM [facility] [phone]   11/17/2013 10:00 AM Marye Round, [facility] [phone]   Future Orders Complete By Expires   Diet - low sodium heart  healthy  As directed    Discharge instructions  As directed    Comments:     Clean ileostomy site daily with hydrogen peroxide and apply dry dressing   Discharge wound care:  As directed    Comments:     Clean wound daily with hydrgen peroxide and dress with a dry dressing.   Increase activity slowly  As directed        Medication List         acetaminophen 325 MG tablet  Commonly known as:  TYLENOL  Take 2 tablets (650 mg total) by mouth every 4 (four) hours as needed for moderate pain.     allopurinol 300 MG tablet  Commonly known as:  ZYLOPRIM  Take 1 tablet (300 mg total) by mouth every morning.     carvedilol 6.25 MG tablet  Commonly known as:  COREG  Take 1 tablet (6.25 mg total) by mouth 2 (two) times daily with a meal.     doxazosin 8 MG tablet  Commonly known as:  CARDURA  Take 8 mg by mouth at bedtime.     HYDROcodone-acetaminophen 5-325 MG per tablet  Commonly known as:  NORCO  Take 1 tablet by mouth every 6 (six) hours as needed.     loperamide 2 MG capsule  Commonly known as:  IMODIUM  Take 1 capsule (2 mg total) by mouth as needed for diarrhea or loose stools.     oxyCODONE-acetaminophen 5-325 MG per tablet  Commonly known as:  PERCOCET/ROXICET  Take 1-2 tablets by mouth every 4 (four) hours as needed for moderate pain.     polycarbophil  625 MG tablet  Commonly known as:  FIBERCON  Take 1 tablet (625 mg total) by mouth daily.           Follow-up Information   Follow up with Rosario Adie., MD In 2 weeks.   Specialty:  General Surgery   Contact information:   [address] [place] [address] [phone]       Follow up with Rosario Adie., MD.   Specialty:  General Surgery   Contact information:   [address] [place] [address] [phone]       Signed: Pedro Earls 08/27/2013, 10:27 AM

## 2013-08-27 NOTE — Progress Notes (Signed)
Dr Harlow Asa notified of abnormal labs.  He stated that he will follow-up during rounds this morning.

## 2013-08-27 NOTE — Progress Notes (Signed)
Assessment unchanged. Pt verbalized understanding of dc instructions as well as My Chart through teach back. Understands and verbalized when to follow up with MD. Script x 1 given as provided by MD. Pt verbalized and demonstrated understanding of how to care for incisional wound at home per MD orders. Discharged via wc to front entrance to meet awaiting vehicle to carry home. Accompanied by wife and NT.

## 2013-08-29 ENCOUNTER — Ambulatory Visit: Payer: Medicare Other

## 2013-08-29 ENCOUNTER — Encounter: Payer: Self-pay | Admitting: Oncology

## 2013-08-29 ENCOUNTER — Other Ambulatory Visit: Payer: Medicare Other

## 2013-08-29 NOTE — Progress Notes (Signed)
Will let PAN know the patient no longer needs asst with [address] notes 03/2013.

## 2013-08-31 ENCOUNTER — Encounter (HOSPITAL_COMMUNITY): Payer: Self-pay | Admitting: Emergency Medicine

## 2013-08-31 ENCOUNTER — Emergency Department (HOSPITAL_COMMUNITY)
Admission: EM | Admit: 2013-08-31 | Discharge: 2013-08-31 | Disposition: A | Discharge status: Home / Self Care | Payer: Medicare Other | Source: Home / Self Care | Attending: Family Medicine | Admitting: Family Medicine

## 2013-08-31 DIAGNOSIS — M109 Gout, unspecified: Secondary | ICD-10-CM

## 2013-08-31 LAB — URIC ACID: Uric Acid, Serum: 3.7 mg/dL — ABNORMAL LOW (ref 4.0–7.8)

## 2013-08-31 MED ORDER — COLCHICINE 0.6 MG PO TABS: 0.3000 mg | ORAL_TABLET | Freq: Every day | ORAL | Status: DC | PRN

## 2013-08-31 NOTE — ED Notes (Signed)
Patient states left hand swollen- started yesterday Denies any injury

## 2013-08-31 NOTE — Discharge Instructions (Signed)
Thank you for coming in today. Take colchicine daily for gout pain.  You can take it twice daily for a few days at first but go to once daily if you have bad diarrhea or stomach upset.  Continue hydrocodone for pain as needed.   Gout Gout is an inflammatory arthritis caused by a buildup of uric acid crystals in the joints. Uric acid is a chemical that is normally present in the blood. When the level of uric acid in the blood is too high it can form crystals that deposit in your joints and tissues. This causes joint redness, soreness, and swelling (inflammation). Repeat attacks are common. Over time, uric acid crystals can form into masses (tophi) near a joint, destroying bone and causing disfigurement. Gout is treatable and often preventable. CAUSES  The disease begins with elevated levels of uric acid in the blood. Uric acid is produced by your body when it breaks down a naturally found substance called purines. Certain foods you eat, such as meats and fish, contain high amounts of purines. Causes of an elevated uric acid level include:  Being passed down from parent to child (heredity).  Diseases that cause increased uric acid production (such as obesity, psoriasis, and certain cancers).  Excessive alcohol use.  Diet, especially diets rich in meat and seafood.  Medicines, including certain cancer-fighting medicines (chemotherapy), water pills (diuretics), and aspirin.  Chronic kidney disease. The kidneys are no longer able to remove uric acid well.  Problems with metabolism. Conditions strongly associated with gout include:  Obesity.  High blood pressure.  High cholesterol.  Diabetes. Not everyone with elevated uric acid levels gets gout. It is not understood why some people get gout and others do not. Surgery, joint injury, and eating too much of certain foods are some of the factors that can lead to gout attacks. SYMPTOMS   An attack of gout comes on quickly. It causes intense  pain with redness, swelling, and warmth in a joint.  Fever can occur.  Often, only one joint is involved. Certain joints are more commonly involved:  Base of the big toe.  Knee.  Ankle.  Wrist.  Finger. Without treatment, an attack usually goes away in a few days to weeks. Between attacks, you usually will not have symptoms, which is different from many other forms of arthritis. DIAGNOSIS  Your caregiver will suspect gout based on your symptoms and exam. In some cases, tests may be recommended. The tests may include:  Blood tests.  Urine tests.  X-rays.  Joint fluid exam. This exam requires a needle to remove fluid from the joint (arthrocentesis). Using a microscope, gout is confirmed when uric acid crystals are seen in the joint fluid. TREATMENT  There are two phases to gout treatment: treating the sudden onset (acute) attack and preventing attacks (prophylaxis).  Treatment of an Acute Attack.  Medicines are used. These include anti-inflammatory medicines or steroid medicines.  An injection of steroid medicine into the affected joint is sometimes necessary.  The painful joint is rested. Movement can worsen the arthritis.  You may use warm or cold treatments on painful joints, depending which works best for you.  Treatment to Prevent Attacks.  If you suffer from frequent gout attacks, your caregiver may advise preventive medicine. These medicines are started after the acute attack subsides. These medicines either help your kidneys eliminate uric acid from your body or decrease your uric acid production. You may need to stay on these medicines for a very long time.  The early phase of treatment with preventive medicine can be associated with an increase in acute gout attacks. For this reason, during the first few months of treatment, your caregiver may also advise you to take medicines usually used for acute gout treatment. Be sure you understand your caregiver's directions.  Your caregiver may make several adjustments to your medicine dose before these medicines are effective.  Discuss dietary treatment with your caregiver or dietitian. Alcohol and drinks high in sugar and fructose and foods such as meat, poultry, and seafood can increase uric acid levels. Your caregiver or dietician can advise you on drinks and foods that should be limited. HOME CARE INSTRUCTIONS   Do not take aspirin to relieve pain. This raises uric acid levels.  Only take over-the-counter or prescription medicines for pain, discomfort, or fever as directed by your caregiver.  Rest the joint as much as possible. When in bed, keep sheets and blankets off painful areas.  Keep the affected joint raised (elevated).  Apply warm or cold treatments to painful joints. Use of warm or cold treatments depends on which works best for you.  Use crutches if the painful joint is in your leg.  Drink enough fluids to keep your urine clear or pale yellow. This helps your body get rid of uric acid. Limit alcohol, sugary drinks, and fructose drinks.  Follow your dietary instructions. Pay careful attention to the amount of protein you eat. Your daily diet should emphasize fruits, vegetables, whole grains, and fat-free or low-fat milk products. Discuss the use of coffee, vitamin C, and cherries with your caregiver or dietician. These may be helpful in lowering uric acid levels.  Maintain a healthy body weight. SEEK MEDICAL CARE IF:   You develop diarrhea, vomiting, or any side effects from medicines.  You do not feel better in 24 hours, or you are getting worse. SEEK IMMEDIATE MEDICAL CARE IF:   Your joint becomes suddenly more tender, and you have chills or a fever. MAKE SURE YOU:   Understand these instructions.  Will watch your condition.  Will get help right away if you are not doing well or get worse. Document Released: 06/20/2000 Document Revised: 10/18/2012 Document Reviewed:  02/04/2012 [facility] Patient Information 2014 [place].

## 2013-08-31 NOTE — ED Provider Notes (Addendum)
Jason [name] is a 68 y.o. male who presents to Urgent Care today for left hand and wrist pain and swelling present for one day. This occurred without injury. Patient has been using Norco for pain control which works somewhat well. Patient has a history of gout. He takes allopurinol daily for gout. Additionally his medical problems significant for colon cancer and stage 3-4 CKD. No fevers chills nausea vomiting or diarrhea. No radiating pain weakness or numbness   Past Medical History  Diagnosis Date  . Gout   . Hypertension   . Hyperplastic colon polyp   . Diverticulosis   . Anemia   . Arthritis   . ED (erectile dysfunction)   . Chronic renal insufficiency, stage II (mild)   . History of radiation therapy 02/21/13-03/31/13    rectum 50.4Gy total dose  . rectal ca dx'd 02/01/13    rectal. Radiation and chemotherapy- remains on chemotherapy-next tx. 08-22-13.    History  Substance Use Topics  . Smoking status: Former Smoker -- 0.50 packs/day for 10 years    Quit date: 07/07/1977  . Smokeless tobacco: Never Used  . Alcohol Use: No     Comment: pint of whisky every 2 weeks and occasional beer until about a month ago-Quit 10'14    ROS as above Medications: No current facility-administered medications for this encounter.   Current Outpatient Prescriptions  Medication Sig Dispense Refill  . acetaminophen (TYLENOL) 325 MG tablet Take 2 tablets (650 mg total) by mouth every 4 (four) hours as needed for moderate pain.      Marland Kitchen allopurinol (ZYLOPRIM) 300 MG tablet Take 1 tablet (300 mg total) by mouth every morning.  100 tablet  3  . carvedilol (COREG) 6.25 MG tablet Take 1 tablet (6.25 mg total) by mouth 2 (two) times daily with a meal.  200 tablet  3  . colchicine 0.6 MG tablet Take 0.5 tablets (0.3 mg total) by mouth daily as needed.  30 tablet  1  . doxazosin (CARDURA) 8 MG tablet Take 8 mg by mouth at bedtime.      Marland Kitchen HYDROcodone-acetaminophen (NORCO) 5-325 MG per tablet Take 1 tablet by  mouth every 6 (six) hours as needed.  30 tablet  0  . loperamide (IMODIUM) 2 MG capsule Take 1 capsule (2 mg total) by mouth as needed for diarrhea or loose stools.  30 capsule  3  . oxyCODONE-acetaminophen (PERCOCET/ROXICET) 5-325 MG per tablet Take 1-2 tablets by mouth every 4 (four) hours as needed for moderate pain.  30 tablet  0  . polycarbophil (FIBERCON) 625 MG tablet Take 1 tablet (625 mg total) by mouth daily.  30 tablet  3    Exam:  BP 156/80  Pulse 77  Temp(Src) 97.8 F (36.6 C) (Oral)  SpO2 100% Gen: Well NAD Left hand and wrist diffusely swollen and tender on the radial aspect. Normal capillary refill ulcers and motion    Assessment and Plan: 68 y.o. male with synovitis versus joint effusion. Likely gout. Plan to treat with colchicine. Will use low-dose colchicine given decreased GFR. Followup with primary care provider. Norco for pain control.  Uric acid level pending Discussed warning signs or symptoms. Please see discharge instructions. Patient expresses understanding.    Gregor Hams, MD 08/31/13 1946  Gregor Hams, MD 08/31/13 1947

## 2013-09-04 ENCOUNTER — Other Ambulatory Visit: Payer: Self-pay | Admitting: Oncology

## 2013-09-05 ENCOUNTER — Ambulatory Visit: Payer: Medicare Other

## 2013-09-05 ENCOUNTER — Encounter (INDEPENDENT_AMBULATORY_CARE_PROVIDER_SITE_OTHER): Payer: Medicare Other | Admitting: General Surgery

## 2013-09-07 ENCOUNTER — Ambulatory Visit (INDEPENDENT_AMBULATORY_CARE_PROVIDER_SITE_OTHER): Payer: Medicare Other | Admitting: Family Medicine

## 2013-09-07 ENCOUNTER — Other Ambulatory Visit (INDEPENDENT_AMBULATORY_CARE_PROVIDER_SITE_OTHER): Payer: Self-pay

## 2013-09-07 ENCOUNTER — Encounter: Payer: Self-pay | Admitting: Family Medicine

## 2013-09-07 ENCOUNTER — Telehealth (INDEPENDENT_AMBULATORY_CARE_PROVIDER_SITE_OTHER): Payer: Self-pay | Admitting: General Surgery

## 2013-09-07 VITALS — BP 140/90 | Temp 98.6°F | Wt 189.0 lb

## 2013-09-07 DIAGNOSIS — R195 Other fecal abnormalities: Secondary | ICD-10-CM

## 2013-09-07 DIAGNOSIS — M109 Gout, unspecified: Secondary | ICD-10-CM

## 2013-09-07 DIAGNOSIS — C20 Malignant neoplasm of rectum: Secondary | ICD-10-CM

## 2013-09-07 LAB — BASIC METABOLIC PANEL
BUN: 15 mg/dL (ref 6–23)
CHLORIDE: 105 meq/L (ref 96–112)
CO2: 29 mEq/L (ref 19–32)
Calcium: 9.7 mg/dL (ref 8.4–10.5)
Creat: 1.75 mg/dL — ABNORMAL HIGH (ref 0.50–1.35)
Glucose, Bld: 91 mg/dL (ref 70–99)
Potassium: 4.5 mEq/L (ref 3.5–5.3)
Sodium: 144 mEq/L (ref 135–145)

## 2013-09-07 LAB — CBC
HEMATOCRIT: 26.3 % — AB (ref 39.0–52.0)
Hemoglobin: 8.5 g/dL — ABNORMAL LOW (ref 13.0–17.0)
MCH: 27.4 pg (ref 26.0–34.0)
MCHC: 32.3 g/dL (ref 30.0–36.0)
MCV: 84.8 fL (ref 78.0–100.0)
PLATELETS: 339 10*3/uL (ref 150–400)
RBC: 3.1 MIL/uL — AB (ref 4.22–5.81)
RDW: 19.1 % — ABNORMAL HIGH (ref 11.5–15.5)
WBC: 4.8 10*3/uL (ref 4.0–10.5)

## 2013-09-07 LAB — URIC ACID: Uric Acid, Serum: 3.8 mg/dL — ABNORMAL LOW (ref 4.0–7.8)

## 2013-09-07 MED ORDER — PREDNISONE 20 MG PO TABS: ORAL_TABLET | ORAL | Status: DC

## 2013-09-07 NOTE — Telephone Encounter (Signed)
Called pt and explained to his understanding that the lab orders Dr. Sherren Mocha entered were not for the CBC and Met panel that Dr. Marcello Moores wanted.  His wife will take him in now to [place] at [facility] to have that drawn today.

## 2013-09-07 NOTE — Telephone Encounter (Signed)
Can you please have him go get some lab work done.  Needs a cbc and chemistry.  If he is feeling fine, I do not need to see him urgently; but if he is having other problems, I would like to see him in urgent office.

## 2013-09-07 NOTE — Telephone Encounter (Signed)
Called pt to go have labs drawn and was informed he just had [facility] and met] per his PCP, Dr. Sherren Mocha.  He was seen there for a flare up of his gout.  No other problems or symptoms, but he will call back if any develop.

## 2013-09-07 NOTE — Progress Notes (Signed)
   Subjective:    Patient ID: Jason [name], male    DOB: 07/09/1945, 68 y.o.   MRN: [id number]  HPI Jason [name] is a 68 year old married male who comes in today for evaluation of gout  He says over the weekend he had a flare up of gout he went to an urgent care Center they gave him colchicine but was $180 for 30 pills. He did purchase 5 pills has been taking one daily and feels about 90% better. He got involved his left wrist and his right great toe. He's been compliant with his allopurinol 300 mg daily   Review of Systems Review of systems negative except for ongoing treatment for colon cancer    Objective:   Physical Exam  Well-developed well-nourished male in no acute distress vital signs stable is afebrile examination left wrist is some tenderness no swelling. There is some palpable tenderness the right great toe no edema and no erythema      Assessment & Plan:  Gout........Marland Kitchen recheck uric acid level prednisone burst and taper.

## 2013-09-07 NOTE — Patient Instructions (Signed)
Prednisone 20 mg.......... take as directed  Lab work today  I will call you the report  Continue the allopurinol 1 daily

## 2013-09-07 NOTE — Progress Notes (Signed)
Pre visit review using our clinic review tool, if applicable. No additional management support is needed unless otherwise documented below in the visit note. 

## 2013-09-07 NOTE — Addendum Note (Signed)
Addended by: Rosario Adie on: 0/12/2692 11:27 AM   Modules accepted: Orders

## 2013-09-07 NOTE — Telephone Encounter (Signed)
Pt called to report his stool since Monday has been black with a hint of green.  It is soft and pasty in nature with a metallic odor.  Please advise if this is of concern and if you want to see him in clinic.

## 2013-09-07 NOTE — Telephone Encounter (Signed)
I do not see those labs in the computer.  If these symptoms started after he got these labs, then he needs to have them repeated.  If he does not want to go get labs done again, then he should call Dr Sherren Mocha and discuss his symptoms with him, since he has those results.

## 2013-09-08 ENCOUNTER — Ambulatory Visit: Payer: Medicare Other

## 2013-09-08 ENCOUNTER — Inpatient Hospital Stay (HOSPITAL_COMMUNITY): Admission: RE | Admit: 2013-09-08 | Payer: Medicare Other | Source: Ambulatory Visit

## 2013-09-08 ENCOUNTER — Other Ambulatory Visit (HOSPITAL_BASED_OUTPATIENT_CLINIC_OR_DEPARTMENT_OTHER): Payer: Medicare Other

## 2013-09-08 ENCOUNTER — Telehealth: Payer: Self-pay | Admitting: Oncology

## 2013-09-08 ENCOUNTER — Other Ambulatory Visit: Payer: Self-pay | Admitting: Oncology

## 2013-09-08 ENCOUNTER — Ambulatory Visit (HOSPITAL_BASED_OUTPATIENT_CLINIC_OR_DEPARTMENT_OTHER): Payer: Medicare Other | Admitting: Oncology

## 2013-09-08 VITALS — BP 156/81 | HR 95 | Temp 98.6°F | Resp 18 | Ht 68.0 in | Wt 184.3 lb

## 2013-09-08 DIAGNOSIS — R197 Diarrhea, unspecified: Secondary | ICD-10-CM

## 2013-09-08 DIAGNOSIS — R12 Heartburn: Secondary | ICD-10-CM

## 2013-09-08 DIAGNOSIS — C20 Malignant neoplasm of rectum: Secondary | ICD-10-CM

## 2013-09-08 DIAGNOSIS — I10 Essential (primary) hypertension: Secondary | ICD-10-CM

## 2013-09-08 DIAGNOSIS — D649 Anemia, unspecified: Secondary | ICD-10-CM

## 2013-09-08 DIAGNOSIS — N289 Disorder of kidney and ureter, unspecified: Secondary | ICD-10-CM

## 2013-09-08 DIAGNOSIS — R609 Edema, unspecified: Secondary | ICD-10-CM

## 2013-09-08 LAB — CBC WITH DIFFERENTIAL/PLATELET
BASO%: 0.6 % (ref 0.0–2.0)
BASOS ABS: 0.1 10*3/uL (ref 0.0–0.1)
EOS ABS: 0 10*3/uL (ref 0.0–0.5)
EOS%: 0.1 % (ref 0.0–7.0)
HCT: 27.6 % — ABNORMAL LOW (ref 38.4–49.9)
HEMOGLOBIN: 8.8 g/dL — AB (ref 13.0–17.1)
LYMPH%: 2.6 % — AB (ref 14.0–49.0)
MCH: 27.6 pg (ref 27.2–33.4)
MCHC: 32 g/dL (ref 32.0–36.0)
MCV: 86.2 fL (ref 79.3–98.0)
MONO#: 0.3 10*3/uL (ref 0.1–0.9)
MONO%: 2.9 % (ref 0.0–14.0)
NEUT%: 93.8 % — ABNORMAL HIGH (ref 39.0–75.0)
NEUTROS ABS: 8.4 10*3/uL — AB (ref 1.5–6.5)
Platelets: 317 10*3/uL (ref 140–400)
RBC: 3.2 10*6/uL — ABNORMAL LOW (ref 4.20–5.82)
RDW: 19 % — ABNORMAL HIGH (ref 11.0–14.6)
WBC: 8.9 10*3/uL (ref 4.0–10.3)
lymph#: 0.2 10*3/uL — ABNORMAL LOW (ref 0.9–3.3)

## 2013-09-08 LAB — COMPREHENSIVE METABOLIC PANEL (CC13)
ALT: 7 U/L (ref 0–55)
AST: 11 U/L (ref 5–34)
Albumin: 3.6 g/dL (ref 3.5–5.0)
Alkaline Phosphatase: 69 U/L (ref 40–150)
Anion Gap: 11 mEq/L (ref 3–11)
BILIRUBIN TOTAL: 0.32 mg/dL (ref 0.20–1.20)
BUN: 21.7 mg/dL (ref 7.0–26.0)
CO2: 24 mEq/L (ref 22–29)
CREATININE: 1.6 mg/dL — AB (ref 0.7–1.3)
Calcium: 10.4 mg/dL (ref 8.4–10.4)
Chloride: 103 mEq/L (ref 98–109)
GLUCOSE: 127 mg/dL (ref 70–140)
Potassium: 4.4 mEq/L (ref 3.5–5.1)
Sodium: 139 mEq/L (ref 136–145)
Total Protein: 7.4 g/dL (ref 6.4–8.3)

## 2013-09-08 LAB — MAGNESIUM (CC13): Magnesium: 1.8 mg/dl (ref 1.5–2.5)

## 2013-09-08 NOTE — Telephone Encounter (Signed)
gv and printed appt sched adn avs forpt for March....doppler today.Jason KitchenMarland [name]

## 2013-09-08 NOTE — Progress Notes (Signed)
   Jason [name]    OFFICE PROGRESS NOTE   INTERVAL HISTORY:   He returns for scheduled followup of rectal cancer. He underwent the ileostomy takedown procedure 08/24/2013. He was discharged on 08/27/2013. He reports diarrhea for the past few days. The stool is "black ". He has been taking Pepto-Bismol and Kaopectate without relief of the diarrhea.  The ileostomy site is healing. He complains of swelling in the left greater the right lower leg. He relates this to a gout flare. He was last treated with 5-FU and leucovorin on 08/15/2013.  Objective:  Vital signs in last 24 hours:  Blood pressure 156/81, pulse 95, temperature 98.6 F (37 C), temperature source Oral, resp. rate 18, height 5\' 8"  (1.727 m), weight 184 lb 4.8 oz (83.598 kg), SpO2 100.00%.    HEENT: No thrush or ulcers, the mucous membranes are moist Resp: Lungs clear bilaterally Cardio: Regular rate and rhythm GI: No hepatomegaly, 2-3 cm superficial opening at the ileostomy wound, no evidence of infection Vascular: Trace to 1+ pitting edema at the left greater the right lower leg   Lab Results:  Lab Results  Component Value Date   WBC 8.9 09/08/2013   HGB 8.8* 09/08/2013   HCT 27.6* 09/08/2013   MCV 86.2 09/08/2013   PLT 317 09/08/2013   NEUTROABS 8.4* 09/08/2013    BUN 21.7, creatinine 1.6, potassium 4.4, magnesium 1.8   Medications: I have reviewed the patient's current medications.  Assessment/Plan: 1. Rectal cancer, clinical stage III (uT3, uN2).  Initiation of concurrent Xeloda and radiation on 02/21/2013, completed 03/31/2013.  CEA normal 04/25/2013  Status post laparoscopic low anterior resection with diverting loop ileostomy 05/23/2013. Final pathology showed a 3.2 cm invasive adenocarcinoma with neoadjuvant related change. Tumor invaded into perirectal soft tissue. There was no lymphovascular or perineural invasion. 14 lymph nodes were negative for tumor. Surgical margins were negative. There was  one hyperplastic polyp (ypT3, pN0).  Initiation of weekly 5-FU/leucovorin 08/01/2013 Ileostomy takedown 08/24/2013 2. Markedly elevated pretreat CEA, normal 04/25/2013. 3. Indeterminate pulmonary/subpleural nodules and right hepatic dome lesion. Unchanged on the CT 04/25/2013 4. Renal insufficiency. Acute on chronic renal failure during hospitalization November/December 2014. Improved following the ileostomy takedown. 5. Anemia. Improved since discharge from the hospital 6. Hypertension. 7. Gout. 8. Prolonged postoperative ileus following laparoscopic low anterior resection with diverting loop ileostomy 05/23/2013. 9. Diarrhea-he is at high risk for C. difficile infection. We will check a stool for the C. difficile today 10. Left greater than right low leg edema-he will be referred for a Doppler of the left leg today      Disposition:  [name] is recovering from the ileostomy takedown procedure. He is scheduled to resume 5-FU/leucovorin today, but he has developed diarrhea. We will check a C. difficile PCR. Chemotherapy will be held today.  The plan is to resume weekly 5-FU/leucovorin on 09/15/2013. He is scheduled for an office visit in 09/22/2013. Chemotherapy will remain on hold for persistent diarrhea.   Betsy Coder, MD  09/08/2013  10:30 AM

## 2013-09-09 ENCOUNTER — Other Ambulatory Visit: Payer: Self-pay | Admitting: *Deleted

## 2013-09-09 ENCOUNTER — Telehealth: Payer: Self-pay | Admitting: *Deleted

## 2013-09-09 ENCOUNTER — Ambulatory Visit: Payer: Medicare Other

## 2013-09-09 DIAGNOSIS — R197 Diarrhea, unspecified: Secondary | ICD-10-CM

## 2013-09-09 LAB — CLOSTRIDIUM DIFFICILE BY PCR: CDIFFPCR: POSITIVE — AB

## 2013-09-09 MED ORDER — METRONIDAZOLE 500 MG PO TABS: 500.0000 mg | ORAL_TABLET | Freq: Three times a day (TID) | ORAL | Status: DC

## 2013-09-09 NOTE — Telephone Encounter (Signed)
Received call report. Pt is positive for CDiff. Dr. Benay Spice made aware. Order received. Rx sent to pharmacy. Spoke with pt and wife, both given instructions on Flagyl and infection prevention. Use warm soapy water to wash hands frequently throughout the day. Clean surfaces with bleach/ water solution. Both voiced understanding. Per Dr. Benay Spice: Cancel chemo next week. Follow up as scheduled 09/22/13. Lab result forwarded to Dr. Marcello Moores.

## 2013-09-12 ENCOUNTER — Ambulatory Visit (INDEPENDENT_AMBULATORY_CARE_PROVIDER_SITE_OTHER): Payer: Medicare Other | Admitting: General Surgery

## 2013-09-12 ENCOUNTER — Telehealth: Payer: Self-pay | Admitting: *Deleted

## 2013-09-12 ENCOUNTER — Encounter (INDEPENDENT_AMBULATORY_CARE_PROVIDER_SITE_OTHER): Payer: Self-pay | Admitting: General Surgery

## 2013-09-12 VITALS — BP 132/80 | HR 80 | Temp 98.3°F | Resp 16 | Ht 68.0 in | Wt 188.8 lb

## 2013-09-12 DIAGNOSIS — C20 Malignant neoplasm of rectum: Secondary | ICD-10-CM

## 2013-09-12 NOTE — Patient Instructions (Signed)
Continue to use fiber supplement once or twice daily.  Return to the office in 1 month.

## 2013-09-12 NOTE — Progress Notes (Signed)
Jason [name] is a 68 y.o. male who is status post a loop ileostomy closeure on 2/18.  He reports relatively good control but has had an episode of diarrhea recently and was diagnosed with C diff.  He reports that the diarrhea got better after starting Flagyl but returned last night.  He denies any cramping or fevers.    Objective: Filed Vitals:   09/12/13 0941  BP: 132/80  Pulse: 80  Temp: 98.3 F (36.8 C)  Resp: 16    General appearance: alert and cooperative GI: normal findings: soft, non-tender ostomy site healing well.  Incision: healing well   Assessment: s/p  Patient Active Problem List   Diagnosis Date Noted  . Rectal cancer 02/01/2013  . Anemia 07/20/2012  . Chronic renal insufficiency 07/31/2011  . EDEMA 09/21/2010  . OBSTRUCTIVE SLEEP APNEA 07/29/2010  . GOUT 05/11/2007  . ERECTILE DYSFUNCTION, MILD 05/11/2007  . HYPERTENSION 05/11/2007  . COLONIC POLYPS, HX OF 05/11/2007    Plan: Jason [name] Is a 68 year old male who is status post Loop ileostomy reversal after a coloanal anastomosis for rectal cancer. He is currently undergoing chemotherapy with Dr. Benay Spice.  He was diagnosed on Friday with C. Difficile colitis. He is currently taking Flagyl for this and has noticed an initial decrease diarrhea but has noticed an increased over the last 12 - 24 hours.  He will contact Dr Gearldine Shown office about his ongoing diarrhea.  We discussed the option of adding an extra fiber supplement to the patient's regimen to help with bowel continence. I will see him back in approximately 4 weeks for reevaluation and anal exam.    .Jason [name], [facility], [place] [phone]   09/12/2013 10:06 AM

## 2013-09-12 NOTE — Telephone Encounter (Signed)
Reports diarrhea started last night with 6-7 stools over night and 5-6 so far today. Stool is brown in color and says it has consistency of "jam" (not pure liquid). Has tried prn Kayopectate without results. Denies N/V or inability to take any po's. Instructed him to obtain Imodium and take #2 tablets four times daily. Push po fluid and avoid greasy, fatty foods and milk until resolved. Call back tomorrow if not better. He understands and agrees.

## 2013-09-13 ENCOUNTER — Telehealth: Payer: Self-pay | Admitting: *Deleted

## 2013-09-13 NOTE — Telephone Encounter (Addendum)
Patient called today reporting he just had a black stool.  This is his first stool since yesterday taking imodium after kaopectate didn't help.  After taking two imodium yesterday continued with maybe five more stools and no more until today.  Expressed to him the two agents he's taking to slow Bm's cause darkened stools.  Instructed to take two imodium now and take one before any further stools not to exceed 24 hour dose of four pills.  The imodium is working you just need to follow through with the one pill with the next two successive Bm's.  Reviewed foods to avoid that are fried, greasy, fresh/raw fruits and vegetables.  Encouraged sports drinks and things that are not too hot or too cold.  Denies further symptoms or complaints at this time.  Asked that he call if no relief of diarrhea.  With this call he mentioned the polycarbophil/fibercon he takes.  Instructed to hold this until diarrhea is controlled explaining this is to stimulate bowels to move.

## 2013-09-15 ENCOUNTER — Ambulatory Visit: Payer: Medicare Other

## 2013-09-15 ENCOUNTER — Other Ambulatory Visit: Payer: Medicare Other

## 2013-09-19 ENCOUNTER — Encounter: Payer: Self-pay | Admitting: Family Medicine

## 2013-09-19 ENCOUNTER — Ambulatory Visit (INDEPENDENT_AMBULATORY_CARE_PROVIDER_SITE_OTHER): Payer: Medicare Other | Admitting: Family Medicine

## 2013-09-19 ENCOUNTER — Telehealth: Payer: Self-pay | Admitting: *Deleted

## 2013-09-19 VITALS — BP 140/100 | Temp 99.5°F | Wt 193.0 lb

## 2013-09-19 DIAGNOSIS — R600 Localized edema: Secondary | ICD-10-CM

## 2013-09-19 DIAGNOSIS — R609 Edema, unspecified: Secondary | ICD-10-CM

## 2013-09-19 HISTORY — DX: Edema, unspecified: R60.9

## 2013-09-19 HISTORY — DX: Localized edema: R60.0

## 2013-09-19 LAB — BASIC METABOLIC PANEL
BUN: 16 mg/dL (ref 6–23)
CO2: 30 mEq/L (ref 19–32)
Calcium: 9.4 mg/dL (ref 8.4–10.5)
Chloride: 104 mEq/L (ref 96–112)
Creatinine, Ser: 1.3 mg/dL (ref 0.4–1.5)
GFR: 68.41 mL/min (ref >60.00)
GLUCOSE: 99 mg/dL (ref 70–99)
POTASSIUM: 4 meq/L (ref 3.5–5.1)
SODIUM: 138 meq/L (ref 135–145)

## 2013-09-19 MED ORDER — HYDROCHLOROTHIAZIDE 12.5 MG PO TABS: 12.5000 mg | ORAL_TABLET | Freq: Every day | ORAL | Status: DC

## 2013-09-19 NOTE — Progress Notes (Signed)
   Subjective:    Patient ID: Jason [name], male    DOB: [birth year], 68 y.o.   MRN: [id number]  HPIglen is a 68 year old male who comes in today for evaluation of peripheral edema  He's had this now for couple weeks. He initially had some pain in his left thumb. Uric acid level was normal and allopurinol 300 mg daily. He has underlying colon cancer    Review of Systems    review of systems negative Objective:   Physical Exam  Well-developed well-nourished male in no acute distress vital signs stable he is afebrile examination legs shows normal peripheral pulses his 1+ to trace edema bilaterally      Assessment & Plan:  Peripheral edema plan treat with diuretics

## 2013-09-19 NOTE — Patient Instructions (Signed)
Hydrochlorothiazide 12.5 mg........ one tablet daily in the morning for fluid retention  Labs today

## 2013-09-19 NOTE — Telephone Encounter (Signed)
Per Dr. Benay Spice; notified pt to complete his Flagyl and will re-assess pt this Thursday at MD appt.  Instructed pt to call if any problems before 09/22/13 at 9:45.  Pt verbalized understanding.

## 2013-09-19 NOTE — Telephone Encounter (Signed)
Pt called and reports " my diarrhea is better; do I need to continue to take flagyl?  I have two days left of medicine, if so I need a re-fill"  Pt states he is eating/drinking well; denies fever.  States his stools are still dark but "better"  Confirmed next appt 09/22/13 lab/MD/treatment.  Note to Dr. Benay Spice.

## 2013-09-22 ENCOUNTER — Other Ambulatory Visit (HOSPITAL_BASED_OUTPATIENT_CLINIC_OR_DEPARTMENT_OTHER): Payer: Medicare Other

## 2013-09-22 ENCOUNTER — Ambulatory Visit (HOSPITAL_BASED_OUTPATIENT_CLINIC_OR_DEPARTMENT_OTHER): Payer: Medicare Other | Admitting: Oncology

## 2013-09-22 ENCOUNTER — Telehealth: Payer: Self-pay | Admitting: *Deleted

## 2013-09-22 ENCOUNTER — Ambulatory Visit (HOSPITAL_BASED_OUTPATIENT_CLINIC_OR_DEPARTMENT_OTHER): Payer: Medicare Other

## 2013-09-22 ENCOUNTER — Telehealth: Payer: Self-pay | Admitting: Oncology

## 2013-09-22 VITALS — BP 157/83 | HR 65 | Temp 98.4°F | Resp 20 | Ht 68.0 in | Wt 188.9 lb

## 2013-09-22 DIAGNOSIS — C20 Malignant neoplasm of rectum: Secondary | ICD-10-CM

## 2013-09-22 DIAGNOSIS — Z5111 Encounter for antineoplastic chemotherapy: Secondary | ICD-10-CM

## 2013-09-22 DIAGNOSIS — R918 Other nonspecific abnormal finding of lung field: Secondary | ICD-10-CM

## 2013-09-22 DIAGNOSIS — N289 Disorder of kidney and ureter, unspecified: Secondary | ICD-10-CM

## 2013-09-22 DIAGNOSIS — R197 Diarrhea, unspecified: Secondary | ICD-10-CM

## 2013-09-22 DIAGNOSIS — K7689 Other specified diseases of liver: Secondary | ICD-10-CM

## 2013-09-22 DIAGNOSIS — R609 Edema, unspecified: Secondary | ICD-10-CM

## 2013-09-22 LAB — COMPREHENSIVE METABOLIC PANEL (CC13)
ALBUMIN: 3.3 g/dL — AB (ref 3.5–5.0)
ALT: 6 U/L (ref 0–55)
AST: 7 U/L (ref 5–34)
Alkaline Phosphatase: 45 U/L (ref 40–150)
Anion Gap: 10 mEq/L (ref 3–11)
BUN: 22.4 mg/dL (ref 7.0–26.0)
CALCIUM: 9.6 mg/dL (ref 8.4–10.4)
CO2: 27 mEq/L (ref 22–29)
Chloride: 101 mEq/L (ref 98–109)
Creatinine: 1.7 mg/dL — ABNORMAL HIGH (ref 0.7–1.3)
GLUCOSE: 119 mg/dL (ref 70–140)
POTASSIUM: 3.5 meq/L (ref 3.5–5.1)
Sodium: 137 mEq/L (ref 136–145)
TOTAL PROTEIN: 6.2 g/dL — AB (ref 6.4–8.3)
Total Bilirubin: 0.3 mg/dL (ref 0.20–1.20)

## 2013-09-22 LAB — CBC WITH DIFFERENTIAL/PLATELET
BASO%: 0.1 % (ref 0.0–2.0)
Basophils Absolute: 0 10*3/uL (ref 0.0–0.1)
EOS ABS: 0 10*3/uL (ref 0.0–0.5)
EOS%: 0 % (ref 0.0–7.0)
HEMATOCRIT: 31.4 % — AB (ref 38.4–49.9)
HGB: 10.3 g/dL — ABNORMAL LOW (ref 13.0–17.1)
LYMPH%: 2.4 % — ABNORMAL LOW (ref 14.0–49.0)
MCH: 27.8 pg (ref 27.2–33.4)
MCHC: 32.8 g/dL (ref 32.0–36.0)
MCV: 84.9 fL (ref 79.3–98.0)
MONO#: 0.4 10*3/uL (ref 0.1–0.9)
MONO%: 3.1 % (ref 0.0–14.0)
NEUT#: 11 10*3/uL — ABNORMAL HIGH (ref 1.5–6.5)
NEUT%: 94.4 % — AB (ref 39.0–75.0)
Platelets: 160 10*3/uL (ref 140–400)
RBC: 3.7 10*6/uL — ABNORMAL LOW (ref 4.20–5.82)
RDW: 21.2 % — ABNORMAL HIGH (ref 11.0–14.6)
WBC: 11.6 10*3/uL — ABNORMAL HIGH (ref 4.0–10.3)
lymph#: 0.3 10*3/uL — ABNORMAL LOW (ref 0.9–3.3)
nRBC: 0 % (ref 0–0)

## 2013-09-22 MED ORDER — PROCHLORPERAZINE MALEATE 10 MG PO TABS
10.0000 mg | ORAL_TABLET | Freq: Once | ORAL | Status: AC
  Administered 2013-09-22: 10 mg via ORAL

## 2013-09-22 MED ORDER — LEUCOVORIN CALCIUM INJECTION 350 MG
500.0000 mg/m2 | Freq: Once | INTRAVENOUS | Status: AC
  Administered 2013-09-22: 980 mg via INTRAVENOUS
  Filled 2013-09-22: qty 49

## 2013-09-22 MED ORDER — SODIUM CHLORIDE 0.9 % IV SOLN
Freq: Once | INTRAVENOUS | Status: AC
  Administered 2013-09-22: 11:00:00 via INTRAVENOUS

## 2013-09-22 MED ORDER — PROCHLORPERAZINE MALEATE 10 MG PO TABS
ORAL_TABLET | ORAL | Status: AC
  Filled 2013-09-22: qty 1

## 2013-09-22 MED ORDER — FLUOROURACIL CHEMO INJECTION 2.5 GM/50ML
600.0000 mg/m2 | Freq: Once | INTRAVENOUS | Status: AC
  Administered 2013-09-22: 1200 mg via INTRAVENOUS
  Filled 2013-09-22: qty 24

## 2013-09-22 NOTE — Progress Notes (Signed)
   [place]    OFFICE PROGRESS NOTE   INTERVAL HISTORY:   [name] returns for scheduled followup of rectal cancer. He was diagnosed with C. difficile colitis on 09/08/2013 and completed a course of Flagyl. The diarrhea is much better. He reports having 4 bowel movements yesterday. He was started on a diuretic earlier this week by Dr. Sherren Mocha. He feels well.  Objective:  Vital signs in last 24 hours:  Blood pressure 157/83, pulse 65, temperature 98.4 F (36.9 C), temperature source Oral, resp. rate 20, height 5\' 8"  (1.727 m), weight 188 lb 14.4 oz (85.684 kg), SpO2 100.00%.    HEENT: No thrush or ulcers Resp: Lungs clear bilaterally Cardio: Regular rate and rhythm GI: No hepatomegaly, the ileostomy site has almost completely healed, nontender Vascular: Trace edema at the left greater than right lower leg and foot  Skin: Hyperpigmentation of the hands     Lab Results:  Lab Results  Component Value Date   WBC 11.6* 09/22/2013   HGB 10.3* 09/22/2013   HCT 31.4* 09/22/2013   MCV 84.9 09/22/2013   PLT 160 09/22/2013   NEUTROABS 11.0* 09/22/2013   Potassium 3.5, BUN 22.4, creatinine 1.7   Medications: I have reviewed the patient's current medications.  Assessment/Plan: 1. Rectal cancer, clinical stage III (uT3, uN2).  Initiation of concurrent Xeloda and radiation on 02/21/2013, completed 03/31/2013.  CEA normal 04/25/2013  Status post laparoscopic low anterior resection with diverting loop ileostomy 05/23/2013. Final pathology showed a 3.2 cm invasive adenocarcinoma with neoadjuvant related change. Tumor invaded into perirectal soft tissue. There was no lymphovascular or perineural invasion. 14 lymph nodes were negative for tumor. Surgical margins were negative. There was one hyperplastic polyp (ypT3, pN0).  Initiation of weekly 5-FU/leucovorin 08/01/2013  Ileostomy takedown 08/24/2013 2. Markedly elevated pretreat CEA, normal 07/21/2013 3. Indeterminate  pulmonary/subpleural nodules and right hepatic dome lesion. Unchanged on the CT 04/25/2013 4. Renal insufficiency. Acute on chronic renal failure during hospitalization November/December 2014. Improved following the ileostomy takedown. 5. Anemia. Improved since discharge from the hospital 6. Hypertension. 7. Gout. 8. Prolonged postoperative ileus following laparoscopic low anterior resection with diverting loop ileostomy 05/23/2013. 9. C. difficile-positive 09/08/2013-status post Flagyl with improvement 10. Left greater than right low leg edema-he was referred for a Doppler 09/08/2013-this was not completed  Disposition:  [name] was diagnosed with C. difficile colitis when he was here on 09/08/2013 and planned 5-FU/leucovorin chemotherapy was held. He appears well today and the diarrhea is much improved. The plan is to proceed with weekly 5-FU/leucovorin beginning today. He will contact us for increased diarrhea. [name] will return for an office visit in 2 weeks. There is persistent mild edema in the left lower leg. He will be referred for a Doppler.   Betsy Coder, MD  09/22/2013  6:29 PM

## 2013-09-22 NOTE — Telephone Encounter (Signed)
Gave pt appt for lab,md and chemo for March and April 2015

## 2013-09-22 NOTE — Telephone Encounter (Signed)
Per staff message and POF I have scheduled appts.  JMW  

## 2013-09-22 NOTE — Patient Instructions (Signed)
[facility] Discharge Instructions for Patients Receiving Chemotherapy  Today you received the following chemotherapy agents Leucovorin and 5FU.  To help prevent nausea and vomiting after your treatment, we encourage you to take your nausea medication.   If you develop nausea and vomiting that is not controlled by your nausea medication, call the clinic.   BELOW ARE SYMPTOMS THAT SHOULD BE REPORTED IMMEDIATELY:  *FEVER GREATER THAN 100.5 F  *CHILLS WITH OR WITHOUT FEVER  NAUSEA AND VOMITING THAT IS NOT CONTROLLED WITH YOUR NAUSEA MEDICATION  *UNUSUAL SHORTNESS OF BREATH  *UNUSUAL BRUISING OR BLEEDING  TENDERNESS IN MOUTH AND THROAT WITH OR WITHOUT PRESENCE OF ULCERS  *URINARY PROBLEMS  *BOWEL PROBLEMS  UNUSUAL RASH Items with * indicate a potential emergency and should be followed up as soon as possible.  Feel free to call the clinic you have any questions or concerns. The clinic phone number is [phone].

## 2013-09-23 ENCOUNTER — Telehealth: Payer: Self-pay | Admitting: *Deleted

## 2013-09-23 ENCOUNTER — Other Ambulatory Visit: Payer: Self-pay | Admitting: *Deleted

## 2013-09-23 ENCOUNTER — Telehealth (INDEPENDENT_AMBULATORY_CARE_PROVIDER_SITE_OTHER): Payer: Self-pay | Admitting: General Surgery

## 2013-09-23 LAB — CEA: CEA: 0.6 ng/mL (ref 0.0–5.0)

## 2013-09-23 NOTE — Telephone Encounter (Signed)
Reports large liquid stool at 0500 this morning and several small loose stools this morning. Stool is dark brown in color. Finished Flagyl yesterday. Per Dr. Benay Spice : Start Imodium now-told him he can take #2 qid up to 8/day. Call if this does not resolve the diarrhea. Push po fluids.  Inquired about his venous doppler that was missed early March-he does not recall why he did not have it done after his visit. Rescheduled for Monday, March 23rd at 11am at [facility]. He agrees to procedure.

## 2013-09-23 NOTE — Telephone Encounter (Signed)
[name] called explaining that he husband has started having diarrhea again as of last night.  After reviewing Dr. Marcello Moores' last note, I asked her if they followed up with Dr. Benay Spice as instructed by Dr. Marcello Moores.  She explained that she had called their office this morning and they had advised him to continue taking the Imodium and that they were going to bring him in to run some tests.  I informed her that I would follow those instructions, but that I would route this message to Dr. Marcello Moores and if she wanted anything else done then her assistant would get in touch with her.

## 2013-09-24 ENCOUNTER — Emergency Department (HOSPITAL_COMMUNITY)
Admission: EM | Admit: 2013-09-24 | Discharge: 2013-09-24 | Disposition: A | Discharge status: Home / Self Care | Payer: Medicare Other | Attending: Emergency Medicine | Admitting: Emergency Medicine

## 2013-09-24 ENCOUNTER — Encounter (HOSPITAL_COMMUNITY): Payer: Self-pay | Admitting: Emergency Medicine

## 2013-09-24 ENCOUNTER — Telehealth (HOSPITAL_BASED_OUTPATIENT_CLINIC_OR_DEPARTMENT_OTHER): Payer: Self-pay | Admitting: Emergency Medicine

## 2013-09-24 DIAGNOSIS — Z87891 Personal history of nicotine dependence: Secondary | ICD-10-CM | POA: Insufficient documentation

## 2013-09-24 DIAGNOSIS — R197 Diarrhea, unspecified: Secondary | ICD-10-CM | POA: Insufficient documentation

## 2013-09-24 DIAGNOSIS — Z8719 Personal history of other diseases of the digestive system: Secondary | ICD-10-CM | POA: Insufficient documentation

## 2013-09-24 DIAGNOSIS — Z862 Personal history of diseases of the blood and blood-forming organs and certain disorders involving the immune mechanism: Secondary | ICD-10-CM | POA: Insufficient documentation

## 2013-09-24 DIAGNOSIS — Z85048 Personal history of other malignant neoplasm of rectum, rectosigmoid junction, and anus: Secondary | ICD-10-CM | POA: Insufficient documentation

## 2013-09-24 DIAGNOSIS — Z79899 Other long term (current) drug therapy: Secondary | ICD-10-CM | POA: Insufficient documentation

## 2013-09-24 DIAGNOSIS — Z860100 Personal history of colon polyps, unspecified: Secondary | ICD-10-CM | POA: Insufficient documentation

## 2013-09-24 DIAGNOSIS — Z792 Long term (current) use of antibiotics: Secondary | ICD-10-CM | POA: Insufficient documentation

## 2013-09-24 DIAGNOSIS — M129 Arthropathy, unspecified: Secondary | ICD-10-CM | POA: Insufficient documentation

## 2013-09-24 DIAGNOSIS — N182 Chronic kidney disease, stage 2 (mild): Secondary | ICD-10-CM | POA: Insufficient documentation

## 2013-09-24 DIAGNOSIS — Z8619 Personal history of other infectious and parasitic diseases: Secondary | ICD-10-CM | POA: Insufficient documentation

## 2013-09-24 DIAGNOSIS — I129 Hypertensive chronic kidney disease with stage 1 through stage 4 chronic kidney disease, or unspecified chronic kidney disease: Secondary | ICD-10-CM | POA: Insufficient documentation

## 2013-09-24 DIAGNOSIS — M109 Gout, unspecified: Secondary | ICD-10-CM | POA: Insufficient documentation

## 2013-09-24 LAB — CBC WITH DIFFERENTIAL/PLATELET
BASOS ABS: 0 10*3/uL (ref 0.0–0.1)
Basophils Relative: 0 % (ref 0–1)
EOS ABS: 0 10*3/uL (ref 0.0–0.7)
EOS PCT: 0 % (ref 0–5)
HCT: 33 % — ABNORMAL LOW (ref 39.0–52.0)
Hemoglobin: 10.7 g/dL — ABNORMAL LOW (ref 13.0–17.0)
Lymphocytes Relative: 2 % — ABNORMAL LOW (ref 12–46)
Lymphs Abs: 0.2 10*3/uL — ABNORMAL LOW (ref 0.7–4.0)
MCH: 28 pg (ref 26.0–34.0)
MCHC: 32.4 g/dL (ref 30.0–36.0)
MCV: 86.4 fL (ref 78.0–100.0)
MONO ABS: 0.5 10*3/uL (ref 0.1–1.0)
Monocytes Relative: 5 % (ref 3–12)
Neutro Abs: 7.9 10*3/uL — ABNORMAL HIGH (ref 1.7–7.7)
Neutrophils Relative %: 93 % — ABNORMAL HIGH (ref 43–77)
PLATELETS: 155 10*3/uL (ref 150–400)
RBC: 3.82 MIL/uL — ABNORMAL LOW (ref 4.22–5.81)
RDW: 21.1 % — AB (ref 11.5–15.5)
WBC: 8.5 10*3/uL (ref 4.0–10.5)

## 2013-09-24 LAB — BASIC METABOLIC PANEL
BUN: 23 mg/dL (ref 6–23)
CALCIUM: 9.7 mg/dL (ref 8.4–10.5)
CO2: 28 mEq/L (ref 19–32)
Chloride: 98 mEq/L (ref 96–112)
Creatinine, Ser: 1.51 mg/dL — ABNORMAL HIGH (ref 0.50–1.35)
GFR calc Af Amer: 54 mL/min — ABNORMAL LOW (ref >90)
GFR calc non Af Amer: 46 mL/min — ABNORMAL LOW (ref >90)
Glucose, Bld: 136 mg/dL — ABNORMAL HIGH (ref 70–99)
Potassium: 4 mEq/L (ref 3.7–5.3)
Sodium: 139 mEq/L (ref 137–147)

## 2013-09-24 LAB — POC OCCULT BLOOD, ED: Fecal Occult Bld: NEGATIVE

## 2013-09-24 LAB — CLOSTRIDIUM DIFFICILE BY PCR: CDIFFPCR: POSITIVE — AB

## 2013-09-24 MED ORDER — LOPERAMIDE HCL 2 MG PO CAPS: 2.0000 mg | ORAL_CAPSULE | Freq: Four times a day (QID) | ORAL | Status: DC | PRN

## 2013-09-24 MED ORDER — SODIUM CHLORIDE 0.9 % IV SOLN
1000.0000 mL | Freq: Once | INTRAVENOUS | Status: AC
  Administered 2013-09-24: 1000 mL via INTRAVENOUS

## 2013-09-24 MED ORDER — VANCOMYCIN HCL 125 MG PO CAPS: 125.0000 mg | ORAL_CAPSULE | Freq: Four times a day (QID) | ORAL | Status: DC

## 2013-09-24 MED ORDER — SODIUM CHLORIDE 0.9 % IV SOLN
1000.0000 mL | INTRAVENOUS | Status: DC
  Administered 2013-09-24: 1000 mL via INTRAVENOUS

## 2013-09-24 NOTE — ED Notes (Signed)
Pt alert and oriented x4. Respirations even and unlabored, bilateral symmetrical rise and fall of chest. Skin warm and dry. In no acute distress. Denies needs.   

## 2013-09-24 NOTE — Telephone Encounter (Signed)
Defer management to Dr Benay Spice

## 2013-09-24 NOTE — ED Provider Notes (Addendum)
CSN: [id number]     Arrival date & time 09/24/13  0902 History   First MD Initiated Contact with Patient 09/24/13 [phone]     Chief Complaint  Patient presents with  . Diarrhea     (Consider location/radiation/quality/duration/timing/severity/associated sxs/prior Treatment) The history is provided by the patient.   he reports recent treatment for C. difficile colitis. He just finished his Flagyl yesterday. He states he been doing better. He develop any diarrhea last night these had several loose watery stools. He denies nausea vomiting. No fevers or chills. No abdominal pain. No melena hematochezia. He does have the stool is slightly darker in color. He has no other complaints. No lightheadedness. He has been drinking fluids.  Past Medical History  Diagnosis Date  . Gout   . Hypertension   . Hyperplastic colon polyp   . Diverticulosis   . Anemia   . Arthritis   . ED (erectile dysfunction)   . Chronic renal insufficiency, stage II (mild)   . History of radiation therapy 02/21/13-03/31/13    rectum 50.4Gy total dose  . rectal ca dx'd 02/01/13    rectal. Radiation and chemotherapy- remains on chemotherapy-next tx. 08-22-13.    Past Surgical History  Procedure Laterality Date  . Colonoscopy  2010    [facility]  . Eus N/A 02/03/2013    Procedure: LOWER ENDOSCOPIC ULTRASOUND (EUS);  Surgeon: Milus Banister, MD;  Location: [facility];  Service: Endoscopy;  Laterality: N/A;  . Laparoscopic low anterior resection N/A 05/20/2013    Procedure: LAPAROSCOPIC LOW ANTERIOR RESECTION WITH SPLENIC FLEXURE MOBILIZATION;  Surgeon: Leighton Ruff, MD;  Location: [facility];  Service: General;  Laterality: N/A;  . Ostomy N/A 05/20/2013    Procedure: diverting OSTOMY;  Surgeon: Leighton Ruff, MD;  Location: [facility];  Service: General;  Laterality: N/A;  . Colon surgery  05/20/2013  . Ileostomy closure N/A 08/24/2013    Procedure: CLOSURE OF LOOP ILEOSTOMY ;  Surgeon: Leighton Ruff, MD;  Location: [facility];   Service: General;  Laterality: N/A;   Family History  Problem Relation Age of Onset  . Hypertension Other   . Stroke Mother   . Stroke Father   . Colon cancer Neg Hx    History  Substance Use Topics  . Smoking status: Former Smoker -- 0.50 packs/day for 10 years    Quit date: 07/07/1977  . Smokeless tobacco: Never Used  . Alcohol Use: No     Comment: pint of whisky every 2 weeks and occasional beer until about a month ago-Quit 10'14     Review of Systems  Gastrointestinal: Positive for diarrhea.  All other systems reviewed and are negative.      Allergies  Nsaids  Home Medications   Current Outpatient Rx  Name  Route  Sig  Dispense  Refill  . acetaminophen (TYLENOL) 325 MG tablet   Oral   Take 2 tablets (650 mg total) by mouth every 4 (four) hours as needed for moderate pain.         Marland Kitchen allopurinol (ZYLOPRIM) 300 MG tablet   Oral   Take 1 tablet (300 mg total) by mouth every morning.   100 tablet   3   . Bismuth Subsalicylate (KAOPECTATE PO)   Oral   Take 3 tablets by mouth every 6 (six) hours as needed (stomach).          . carvedilol (COREG) 6.25 MG tablet   Oral   Take 1 tablet (6.25 mg total) by mouth  2 (two) times daily with a meal.   200 tablet   3   . colchicine 0.6 MG tablet   Oral   Take 0.5 tablets (0.3 mg total) by mouth daily as needed.   30 tablet   1   . doxazosin (CARDURA) 8 MG tablet   Oral   Take 8 mg by mouth at bedtime.         . hydrochlorothiazide (HYDRODIURIL) 12.5 MG tablet   Oral   Take 1 tablet (12.5 mg total) by mouth daily.   90 tablet   3   . HYDROcodone-acetaminophen (NORCO) 5-325 MG per tablet   Oral   Take 1 tablet by mouth every 6 (six) hours as needed.   30 tablet   0   . loperamide (IMODIUM) 2 MG capsule   Oral   Take 1 capsule (2 mg total) by mouth as needed for diarrhea or loose stools.   30 capsule   3   . Magnesium 250 MG TABS   Oral   Take 1 tablet by mouth daily.         . metroNIDAZOLE  (FLAGYL) 500 MG tablet   Oral   Take 1 tablet (500 mg total) by mouth 3 (three) times daily.   42 tablet   0   . polycarbophil (FIBERCON) 625 MG tablet   Oral   Take 1,250 mg by mouth daily.         . predniSONE (DELTASONE) 20 MG tablet      2 tabs x 3 days, 1 tab x 3 days, 1/2 tab x 3 days, 1/2 tab M,W,F x 2 weeks   40 tablet   1   . PRESCRIPTION MEDICATION               . loperamide (IMODIUM) 2 MG capsule   Oral   Take 1 capsule (2 mg total) by mouth 4 (four) times daily as needed for diarrhea or loose stools.   12 capsule   0    BP 148/95  Pulse 79  Temp(Src) 98.1 F (36.7 C) (Oral)  Resp 20  SpO2 100% Physical Exam  Nursing note and vitals reviewed. Constitutional: He is oriented to person, place, and time. He appears well-developed and well-nourished.  HENT:  Head: Normocephalic and atraumatic.  Eyes: EOM are normal.  Neck: Normal range of motion.  Cardiovascular: Normal rate, regular rhythm, normal heart sounds and intact distal pulses.   Pulmonary/Chest: Effort normal and breath sounds normal. No respiratory distress.  Abdominal: Soft. He exhibits no distension. There is no tenderness.  Genitourinary:  Dark brown stool. Hemoccult negative  Musculoskeletal: Normal range of motion.  Neurological: He is alert and oriented to person, place, and time.  Skin: Skin is warm and dry.  Psychiatric: He has a normal mood and affect. Judgment normal.    ED Course  Procedures (including critical care time) Labs Review Labs Reviewed  CBC WITH DIFFERENTIAL - Abnormal; Notable for the following:    RBC 3.82 (*)    Hemoglobin 10.7 (*)    HCT 33.0 (*)    RDW 21.1 (*)    Neutrophils Relative % 93 (*)    Neutro Abs 7.9 (*)    Lymphocytes Relative 2 (*)    Lymphs Abs 0.2 (*)    All other components within normal limits  BASIC METABOLIC PANEL - Abnormal; Notable for the following:    Glucose, Bld 136 (*)    Creatinine, Ser 1.51 (*)  GFR calc non Af Amer 46  (*)    GFR calc Af Amer 54 (*)    All other components within normal limits  CLOSTRIDIUM DIFFICILE BY PCR  POC OCCULT BLOOD, ED   Imaging Review No results found.   EKG Interpretation None      MDM   Final diagnoses:  Diarrhea    Nontoxic appearing. C. difficile sample sent. Patient will followup with his doctor regarding the results. We'll hold and by this time. Hemodynamically stable. One loose stool the ER. No abdominal complaints    Hoy Morn, MD 09/24/13 1248  3:17 PM  pt with positive C diff. Will call in prescription for 10 day course of vancomycin orally. To be called in by flow manager. Pt will be informed of cdiff dx  Hoy Morn, MD 09/24/13 [phone]

## 2013-09-24 NOTE — ED Notes (Signed)
Pt escorted to discharge window. Pt verbalized understanding discharge instructions. In no acute distress.  

## 2013-09-24 NOTE — ED Notes (Signed)
Per Dr Venora Maples call patient to notify of + C-diff and call in RX Vancomycin 125 mg PO QID x 10 days. Pt notified by phone after ID verified. Pt states he will follow-up with his PMD this week. RX Vancomycin called to [facility] [phone]. Infection control instructions given. Patient verbalized understanding.

## 2013-09-24 NOTE — ED Notes (Signed)
Pt c/o diarrhea since this morning.  No pain or vomiting.

## 2013-09-24 NOTE — Telephone Encounter (Signed)
Per Dr Venora Maples call patient to notify of + C-diff and call in RX Vancomycin 125 mg PO QID x 10 days. Pt notified by phone after ID verified. Pt states he will follow-up with his PMD this week. RX Vancomycin called to [facility] [phone]. Infection control instructions given. Patient verbalized understanding.

## 2013-09-24 NOTE — ED Notes (Signed)
Lab called + c-diff. Dr Venora Maples notified, no new orders given

## 2013-09-26 ENCOUNTER — Ambulatory Visit (HOSPITAL_COMMUNITY)
Admission: RE | Admit: 2013-09-26 | Discharge: 2013-09-26 | Disposition: A | Discharge status: Home / Self Care | Payer: Medicare Other | Source: Ambulatory Visit | Attending: Oncology | Admitting: Oncology

## 2013-09-26 ENCOUNTER — Other Ambulatory Visit: Payer: Self-pay | Admitting: *Deleted

## 2013-09-26 ENCOUNTER — Encounter: Payer: Self-pay | Admitting: Oncology

## 2013-09-26 ENCOUNTER — Telehealth: Payer: Self-pay | Admitting: *Deleted

## 2013-09-26 DIAGNOSIS — R197 Diarrhea, unspecified: Secondary | ICD-10-CM

## 2013-09-26 DIAGNOSIS — M7989 Other specified soft tissue disorders: Secondary | ICD-10-CM

## 2013-09-26 DIAGNOSIS — C20 Malignant neoplasm of rectum: Secondary | ICD-10-CM

## 2013-09-26 NOTE — Progress Notes (Signed)
*  Preliminary Results* Left lower extremity venous duplex completed. Left lower extremity is negative for deep vein thrombosis. There is no evidence of left Baker's cyst.  Attempted to call preliminary results to [phone], however there was no answer. The patient will be discharged and can be reached by phone if necessary.  09/26/2013 11:33 AM  Maudry Mayhew, RVT, RDCS, RDMS

## 2013-09-26 NOTE — Telephone Encounter (Signed)
[place] again at 1400 and 1458--was given [phone] to do his prior authorization for vancomycin. Forwarded to managed care.

## 2013-09-26 NOTE — Telephone Encounter (Signed)
Went to ER over weekend for diarrhea and stool getting darker--hemoccult was negative. Per notes-C. Diff not resolved, so ER ordered 10 days of vancomycin po. Patient left VM requesting RN call back.

## 2013-09-26 NOTE — Telephone Encounter (Signed)
Made Byan aware that we are awaiting the PA to process and hopefully his co-pay will be less. Will follow up with him tomorrow.

## 2013-09-26 NOTE — Progress Notes (Signed)
[employer] Rx, [id number], for vancomycin pa; faxed pa form to [phone]

## 2013-09-26 NOTE — Telephone Encounter (Signed)
Spoke with Monica Martinez, who reports his co-pay for vancomycin was $100, so he did not pick up the medication (can't afford it). Asking about going back on Flagyl. Informed him that once he completes Flagyl and infection is not resolve, he needs the vancomycin. Will call pharmacy to follow up on cost and to determine if he needed a prior authorization, and this is why his cost was so high. Also informed him that he was negative for blood clot per vascular lab note. Called his [facility] on [address] and was told it did require a prior authorization. They will fax the paperwork to office now.

## 2013-09-27 ENCOUNTER — Encounter: Payer: Self-pay | Admitting: Oncology

## 2013-09-27 ENCOUNTER — Telehealth: Payer: Self-pay | Admitting: *Deleted

## 2013-09-27 NOTE — Progress Notes (Signed)
[facility], [id number], approved vancomycin from 09/26/13-10/09/13

## 2013-09-27 NOTE — Telephone Encounter (Signed)
Confirmed with wife that he did pick up the antibiotic today and the cost after PA was $147. Prior to this it was $500+, so they picked up med and he started taking it today.

## 2013-09-29 ENCOUNTER — Other Ambulatory Visit (HOSPITAL_BASED_OUTPATIENT_CLINIC_OR_DEPARTMENT_OTHER): Payer: Medicare Other

## 2013-09-29 ENCOUNTER — Ambulatory Visit (HOSPITAL_BASED_OUTPATIENT_CLINIC_OR_DEPARTMENT_OTHER): Payer: Medicare Other

## 2013-09-29 VITALS — BP 108/75 | HR 77 | Temp 98.8°F | Resp 20

## 2013-09-29 DIAGNOSIS — C20 Malignant neoplasm of rectum: Secondary | ICD-10-CM

## 2013-09-29 DIAGNOSIS — Z5111 Encounter for antineoplastic chemotherapy: Secondary | ICD-10-CM

## 2013-09-29 LAB — CBC WITH DIFFERENTIAL/PLATELET
BASO%: 0 % (ref 0.0–2.0)
Basophils Absolute: 0 10*3/uL (ref 0.0–0.1)
EOS ABS: 0 10*3/uL (ref 0.0–0.5)
EOS%: 0.1 % (ref 0.0–7.0)
HCT: 30.5 % — ABNORMAL LOW (ref 38.4–49.9)
HEMOGLOBIN: 10 g/dL — AB (ref 13.0–17.1)
LYMPH%: 4.7 % — AB (ref 14.0–49.0)
MCH: 28 pg (ref 27.2–33.4)
MCHC: 32.8 g/dL (ref 32.0–36.0)
MCV: 85.4 fL (ref 79.3–98.0)
MONO#: 0.8 10*3/uL (ref 0.1–0.9)
MONO%: 7.6 % (ref 0.0–14.0)
NEUT#: 9.3 10*3/uL — ABNORMAL HIGH (ref 1.5–6.5)
NEUT%: 87.6 % — AB (ref 39.0–75.0)
PLATELETS: 150 10*3/uL (ref 140–400)
RBC: 3.57 10*6/uL — ABNORMAL LOW (ref 4.20–5.82)
RDW: 21.1 % — AB (ref 11.0–14.6)
WBC: 10.6 10*3/uL — ABNORMAL HIGH (ref 4.0–10.3)
lymph#: 0.5 10*3/uL — ABNORMAL LOW (ref 0.9–3.3)
nRBC: 0 % (ref 0–0)

## 2013-09-29 LAB — TECHNOLOGIST REVIEW

## 2013-09-29 MED ORDER — SODIUM CHLORIDE 0.9 % IV SOLN
Freq: Once | INTRAVENOUS | Status: AC
  Administered 2013-09-29: 15:00:00 via INTRAVENOUS

## 2013-09-29 MED ORDER — PROCHLORPERAZINE MALEATE 10 MG PO TABS
10.0000 mg | ORAL_TABLET | Freq: Once | ORAL | Status: AC
  Administered 2013-09-29: 10 mg via ORAL

## 2013-09-29 MED ORDER — PROCHLORPERAZINE MALEATE 10 MG PO TABS
ORAL_TABLET | ORAL | Status: AC
  Filled 2013-09-29: qty 1

## 2013-09-29 MED ORDER — LEUCOVORIN CALCIUM INJECTION 350 MG
500.0000 mg/m2 | Freq: Once | INTRAVENOUS | Status: AC
  Administered 2013-09-29: 980 mg via INTRAVENOUS
  Filled 2013-09-29: qty 49

## 2013-09-29 MED ORDER — FLUOROURACIL CHEMO INJECTION 2.5 GM/50ML
600.0000 mg/m2 | Freq: Once | INTRAVENOUS | Status: AC
  Administered 2013-09-29: 1200 mg via INTRAVENOUS
  Filled 2013-09-29: qty 24

## 2013-09-29 NOTE — Patient Instructions (Signed)
[facility] Discharge Instructions for Patients Receiving Chemotherapy  Today you received the following chemotherapy agents 5FU/Leucovorin.  To help prevent nausea and vomiting after your treatment, we encourage you to take your nausea medication if needed.    If you develop nausea and vomiting that is not controlled by your nausea medication, call the clinic.   BELOW ARE SYMPTOMS THAT SHOULD BE REPORTED IMMEDIATELY:  *FEVER GREATER THAN 100.5 F  *CHILLS WITH OR WITHOUT FEVER  NAUSEA AND VOMITING THAT IS NOT CONTROLLED WITH YOUR NAUSEA MEDICATION  *UNUSUAL SHORTNESS OF BREATH  *UNUSUAL BRUISING OR BLEEDING  TENDERNESS IN MOUTH AND THROAT WITH OR WITHOUT PRESENCE OF ULCERS  *URINARY PROBLEMS  *BOWEL PROBLEMS  UNUSUAL RASH Items with * indicate a potential emergency and should be followed up as soon as possible.  Feel free to call the clinic you have any questions or concerns. The clinic phone number is [phone].

## 2013-10-04 ENCOUNTER — Telehealth: Payer: Self-pay | Admitting: *Deleted

## 2013-10-04 NOTE — Telephone Encounter (Signed)
Per desk RN I have moved appts from 4/2 to 4/1. Treatment stays for 4/2. Appts for 4/9 moved also

## 2013-10-04 NOTE — Telephone Encounter (Signed)
Call from pt's wife requesting to move 4/2 appts to earlier in the day or to cancel office visit so pt can finish early that day. Mrs. Faircloth has to get to work that afternoon. OK, per Lattie Haw to bring pt in for office visit day prior. Request sent to chemo scheduler for earlier appt.

## 2013-10-05 ENCOUNTER — Ambulatory Visit (HOSPITAL_BASED_OUTPATIENT_CLINIC_OR_DEPARTMENT_OTHER): Payer: Medicare Other | Admitting: Nurse Practitioner

## 2013-10-05 ENCOUNTER — Telehealth: Payer: Self-pay | Admitting: *Deleted

## 2013-10-05 ENCOUNTER — Other Ambulatory Visit: Payer: Self-pay | Admitting: Nurse Practitioner

## 2013-10-05 ENCOUNTER — Telehealth: Payer: Self-pay | Admitting: Oncology

## 2013-10-05 ENCOUNTER — Other Ambulatory Visit (HOSPITAL_BASED_OUTPATIENT_CLINIC_OR_DEPARTMENT_OTHER): Payer: Medicare Other

## 2013-10-05 VITALS — BP 136/80 | HR 83 | Temp 98.0°F | Resp 18 | Ht 68.0 in | Wt 186.7 lb

## 2013-10-05 DIAGNOSIS — C20 Malignant neoplasm of rectum: Secondary | ICD-10-CM

## 2013-10-05 DIAGNOSIS — M109 Gout, unspecified: Secondary | ICD-10-CM

## 2013-10-05 DIAGNOSIS — D649 Anemia, unspecified: Secondary | ICD-10-CM

## 2013-10-05 DIAGNOSIS — R609 Edema, unspecified: Secondary | ICD-10-CM

## 2013-10-05 DIAGNOSIS — N289 Disorder of kidney and ureter, unspecified: Secondary | ICD-10-CM

## 2013-10-05 DIAGNOSIS — R918 Other nonspecific abnormal finding of lung field: Secondary | ICD-10-CM

## 2013-10-05 DIAGNOSIS — I10 Essential (primary) hypertension: Secondary | ICD-10-CM

## 2013-10-05 DIAGNOSIS — R790 Abnormal level of blood mineral: Secondary | ICD-10-CM

## 2013-10-05 LAB — MAGNESIUM (CC13): MAGNESIUM: 1 mg/dL — AB (ref 1.5–2.5)

## 2013-10-05 LAB — CBC WITH DIFFERENTIAL/PLATELET
BASO%: 0.3 % (ref 0.0–2.0)
Basophils Absolute: 0 10*3/uL (ref 0.0–0.1)
EOS ABS: 0.1 10*3/uL (ref 0.0–0.5)
EOS%: 1.6 % (ref 0.0–7.0)
HCT: 32 % — ABNORMAL LOW (ref 38.4–49.9)
HGB: 10.6 g/dL — ABNORMAL LOW (ref 13.0–17.1)
LYMPH%: 3.2 % — AB (ref 14.0–49.0)
MCH: 29.6 pg (ref 27.2–33.4)
MCHC: 33.1 g/dL (ref 32.0–36.0)
MCV: 89.5 fL (ref 79.3–98.0)
MONO#: 0.6 10*3/uL (ref 0.1–0.9)
MONO%: 11.3 % (ref 0.0–14.0)
NEUT#: 4.7 10*3/uL (ref 1.5–6.5)
NEUT%: 83.6 % — ABNORMAL HIGH (ref 39.0–75.0)
PLATELETS: 158 10*3/uL (ref 140–400)
RBC: 3.57 10*6/uL — ABNORMAL LOW (ref 4.20–5.82)
RDW: 23 % — ABNORMAL HIGH (ref 11.0–14.6)
WBC: 5.7 10*3/uL (ref 4.0–10.3)
lymph#: 0.2 10*3/uL — ABNORMAL LOW (ref 0.9–3.3)

## 2013-10-05 LAB — BASIC METABOLIC PANEL (CC13)
Anion Gap: 10 mEq/L (ref 3–11)
BUN: 25.6 mg/dL (ref 7.0–26.0)
CALCIUM: 9.2 mg/dL (ref 8.4–10.4)
CHLORIDE: 95 meq/L — AB (ref 98–109)
CO2: 25 meq/L (ref 22–29)
Creatinine: 1.5 mg/dL — ABNORMAL HIGH (ref 0.7–1.3)
GLUCOSE: 92 mg/dL (ref 70–140)
Potassium: 3.4 mEq/L — ABNORMAL LOW (ref 3.5–5.1)
Sodium: 129 mEq/L — ABNORMAL LOW (ref 136–145)

## 2013-10-05 MED ORDER — MAGNESIUM OXIDE 400 (241.3 MG) MG PO TABS: 400.0000 mg | ORAL_TABLET | Freq: Two times a day (BID) | ORAL | Status: DC

## 2013-10-05 NOTE — Telephone Encounter (Signed)
Called patient with lab results and informed that a prescription for magnesium will be sent to his pharmacy.  Per Jason [name]. [name].  Patient verbalized understanding.

## 2013-10-05 NOTE — Progress Notes (Signed)
  [facility] OFFICE PROGRESS NOTE   Diagnosis:  Rectal cancer.   INTERVAL HISTORY:   Jason [name] returns as scheduled. He denies nausea/vomiting. No mouth sores. The diarrhea has resolved. He continues oral vancomycin. No hand or foot pain or redness. He denies abdominal pain. He has a good appetite.  Objective:  Vital signs in last 24 hours:  Blood pressure 136/80, pulse 83, temperature 98 F (36.7 C), temperature source Oral, resp. rate 18, height 5\' 8"  (1.727 m), weight 186 lb 11.2 oz (84.687 kg), SpO2 100.00%.    HEENT: No thrush or ulcerations. Resp: Lungs clear. Cardio: Regular cardiac rhythm. GI: Abdomen soft and nontender. No hepatomegaly. Vascular: Trace pitting edema left lower leg/ankle.  Skin: Hands with hyperpigmentation. Palms without erythema. No skin breakdown.    Lab Results:  Lab Results  Component Value Date   WBC 5.7 10/05/2013   HGB 10.6* 10/05/2013   HCT 32.0* 10/05/2013   MCV 89.5 10/05/2013   PLT 158 10/05/2013   NEUTROABS 4.7 10/05/2013      Lab Results  Component Value Date   CEA 0.6 09/22/2013    Imaging:  No results found.  Medications: I have reviewed the patient's current medications.  Assessment/Plan: 1. Rectal cancer, clinical stage III (uT3, uN2).  Initiation of concurrent Xeloda and radiation on 02/21/2013, completed 03/31/2013.  CEA normal 04/25/2013  Status post laparoscopic low anterior resection with diverting loop ileostomy 05/23/2013. Final pathology showed a 3.2 cm invasive adenocarcinoma with neoadjuvant related change. Tumor invaded into perirectal soft tissue. There was no lymphovascular or perineural invasion. 14 lymph nodes were negative for tumor. Surgical margins were negative. There was one hyperplastic polyp (ypT3, pN0).  Initiation of weekly 5-FU/leucovorin 08/01/2013  Ileostomy takedown 08/24/2013. Weekly 5-FU/leucovorin resumed 09/22/2013. 2. Markedly elevated pretreat CEA, normal 07/21/2013,  09/22/2013. 3. Indeterminate pulmonary/subpleural nodules and right hepatic dome lesion. Unchanged on the CT 04/25/2013 4. Renal insufficiency. Acute on chronic renal failure during hospitalization November/December 2014. Improved following the ileostomy takedown. 5. Anemia. Improved since discharge from the hospital 6. Hypertension. 7. Gout. 8. Prolonged postoperative ileus following laparoscopic low anterior resection with diverting loop ileostomy 05/23/2013. 9. C. difficile-positive 09/08/2013-status post Flagyl with improvement. Recurrent diarrhea following completion of Flagyl. Stool positive for C. difficile on 09/24/2013. Now on vancomycin. The diarrhea has resolved. 10. Left greater than right low leg edema. Venous Doppler 09/26/2013 negative for left leg DVT.   Disposition: He appears stable. He will return for cycle 2 week 3 5-FU/leucovorin as scheduled on 10/06/2013 and week 4 on 10/13/2013. He will then return to begin cycle 3 on 10/24/2013 (patient requests changing treatment to Mondays). We will see him in followup on 10/24/2013. He will contact the office in the interim with any problems.  Plan reviewed with Dr. Benay Spice.    Jason [name] ANP/GNP-BC   10/05/2013  10:33 AM

## 2013-10-05 NOTE — Telephone Encounter (Signed)
gv and printed aptp sched and avs for pt for April adn May....sed added tx.

## 2013-10-06 ENCOUNTER — Ambulatory Visit: Payer: Medicare Other | Admitting: Nurse Practitioner

## 2013-10-06 ENCOUNTER — Other Ambulatory Visit: Payer: Medicare Other

## 2013-10-06 ENCOUNTER — Ambulatory Visit (HOSPITAL_BASED_OUTPATIENT_CLINIC_OR_DEPARTMENT_OTHER): Payer: Medicare Other

## 2013-10-06 VITALS — BP 159/91 | HR 76 | Temp 98.9°F | Resp 20

## 2013-10-06 DIAGNOSIS — C20 Malignant neoplasm of rectum: Secondary | ICD-10-CM

## 2013-10-06 DIAGNOSIS — Z5111 Encounter for antineoplastic chemotherapy: Secondary | ICD-10-CM

## 2013-10-06 MED ORDER — PROCHLORPERAZINE MALEATE 10 MG PO TABS
ORAL_TABLET | ORAL | Status: AC
  Filled 2013-10-06: qty 1

## 2013-10-06 MED ORDER — LEUCOVORIN CALCIUM INJECTION 350 MG
500.0000 mg/m2 | Freq: Once | INTRAVENOUS | Status: AC
  Administered 2013-10-06: 980 mg via INTRAVENOUS
  Filled 2013-10-06: qty 49

## 2013-10-06 MED ORDER — FLUOROURACIL CHEMO INJECTION 2.5 GM/50ML
600.0000 mg/m2 | Freq: Once | INTRAVENOUS | Status: AC
  Administered 2013-10-06: 1200 mg via INTRAVENOUS
  Filled 2013-10-06: qty 24

## 2013-10-06 MED ORDER — SODIUM CHLORIDE 0.9 % IV SOLN
Freq: Once | INTRAVENOUS | Status: AC
  Administered 2013-10-06: 09:00:00 via INTRAVENOUS

## 2013-10-06 MED ORDER — PROCHLORPERAZINE MALEATE 10 MG PO TABS
10.0000 mg | ORAL_TABLET | Freq: Once | ORAL | Status: AC
  Administered 2013-10-06: 10 mg via ORAL

## 2013-10-06 NOTE — Patient Instructions (Signed)
[facility] Discharge Instructions for Patients Receiving Chemotherapy  Today you received the following chemotherapy agents: Leucovorin, Adrucil (5FU)  To help prevent nausea and vomiting after your treatment, we encourage you to take your nausea medication as prescribed by your physician.    If you develop nausea and vomiting that is not controlled by your nausea medication, call the clinic.   BELOW ARE SYMPTOMS THAT SHOULD BE REPORTED IMMEDIATELY:  *FEVER GREATER THAN 100.5 F  *CHILLS WITH OR WITHOUT FEVER  NAUSEA AND VOMITING THAT IS NOT CONTROLLED WITH YOUR NAUSEA MEDICATION  *UNUSUAL SHORTNESS OF BREATH  *UNUSUAL BRUISING OR BLEEDING  TENDERNESS IN MOUTH AND THROAT WITH OR WITHOUT PRESENCE OF ULCERS  *URINARY PROBLEMS  *BOWEL PROBLEMS  UNUSUAL RASH Items with * indicate a potential emergency and should be followed up as soon as possible.  Feel free to call the clinic you have any questions or concerns. The clinic phone number is [phone].

## 2013-10-06 NOTE — Progress Notes (Signed)
Patient had labs 10/05/13, labs within treatment parameters. Patient reports obtaining magnesium yesterday for low level and is taking per MD order.

## 2013-10-06 NOTE — Progress Notes (Signed)
5 FU administered 1 hour into the leucovorin infusion. Blood return noted, Normal saline free dripping during 5 FU administration. Patient tolerated well, with no complaints.

## 2013-10-12 ENCOUNTER — Other Ambulatory Visit: Payer: Medicare Other

## 2013-10-12 ENCOUNTER — Other Ambulatory Visit (HOSPITAL_BASED_OUTPATIENT_CLINIC_OR_DEPARTMENT_OTHER): Payer: Medicare Other

## 2013-10-12 ENCOUNTER — Other Ambulatory Visit: Payer: Self-pay

## 2013-10-12 ENCOUNTER — Telehealth: Payer: Self-pay | Admitting: *Deleted

## 2013-10-12 DIAGNOSIS — D649 Anemia, unspecified: Secondary | ICD-10-CM

## 2013-10-12 DIAGNOSIS — C20 Malignant neoplasm of rectum: Secondary | ICD-10-CM

## 2013-10-12 DIAGNOSIS — N289 Disorder of kidney and ureter, unspecified: Secondary | ICD-10-CM

## 2013-10-12 LAB — CBC WITH DIFFERENTIAL/PLATELET
BASO%: 0.6 % (ref 0.0–2.0)
Basophils Absolute: 0 10*3/uL (ref 0.0–0.1)
EOS%: 1.8 % (ref 0.0–7.0)
Eosinophils Absolute: 0.1 10*3/uL (ref 0.0–0.5)
HEMATOCRIT: 30.5 % — AB (ref 38.4–49.9)
HGB: 10 g/dL — ABNORMAL LOW (ref 13.0–17.1)
LYMPH#: 0.2 10*3/uL — AB (ref 0.9–3.3)
LYMPH%: 5.2 % — AB (ref 14.0–49.0)
MCH: 29.5 pg (ref 27.2–33.4)
MCHC: 32.7 g/dL (ref 32.0–36.0)
MCV: 90.2 fL (ref 79.3–98.0)
MONO#: 0.4 10*3/uL (ref 0.1–0.9)
MONO%: 9.5 % (ref 0.0–14.0)
NEUT#: 3.7 10*3/uL (ref 1.5–6.5)
NEUT%: 82.9 % — AB (ref 39.0–75.0)
PLATELETS: 222 10*3/uL (ref 140–400)
RBC: 3.38 10*6/uL — AB (ref 4.20–5.82)
RDW: 22.5 % — ABNORMAL HIGH (ref 11.0–14.6)
WBC: 4.5 10*3/uL (ref 4.0–10.3)

## 2013-10-12 LAB — COMPREHENSIVE METABOLIC PANEL (CC13)
ALT: 15 U/L (ref 0–55)
AST: 15 U/L (ref 5–34)
Albumin: 3.2 g/dL — ABNORMAL LOW (ref 3.5–5.0)
Alkaline Phosphatase: 65 U/L (ref 40–150)
Anion Gap: 11 mEq/L (ref 3–11)
BILIRUBIN TOTAL: 0.39 mg/dL (ref 0.20–1.20)
BUN: 20.8 mg/dL (ref 7.0–26.0)
CO2: 27 mEq/L (ref 22–29)
CREATININE: 1.8 mg/dL — AB (ref 0.7–1.3)
Calcium: 10.1 mg/dL (ref 8.4–10.4)
Chloride: 99 mEq/L (ref 98–109)
Glucose: 93 mg/dl (ref 70–140)
Potassium: 3.3 mEq/L — ABNORMAL LOW (ref 3.5–5.1)
SODIUM: 136 meq/L (ref 136–145)
TOTAL PROTEIN: 6.8 g/dL (ref 6.4–8.3)

## 2013-10-12 LAB — MAGNESIUM (CC13): Magnesium: 1.1 mg/dl — CL (ref 1.5–2.5)

## 2013-10-12 MED ORDER — POTASSIUM CHLORIDE CRYS ER 20 MEQ PO TBCR: 20.0000 meq | EXTENDED_RELEASE_TABLET | Freq: Every day | ORAL | Status: DC

## 2013-10-12 NOTE — Telephone Encounter (Signed)
Called with lab results. Need to start ktab 20 meq daily and push fluids. Continue magnesium oxide twice daily (confirmed he is taking it) as ordered. Routed copy of labs to Dr. Elmarie Shiley (renal).

## 2013-10-12 NOTE — Addendum Note (Signed)
Addended by: Tania Ade on: 10/12/2013 02:11 PM   Modules accepted: Orders

## 2013-10-12 NOTE — Telephone Encounter (Signed)
Needs to reschedule lab he missed this am due to diarrhea. Needed before 2 pm. Rescheduled for 11:30 today-attempted to reach patient at home-was not there. Attempted to call cell #-voice mail has not been set up yet.

## 2013-10-13 ENCOUNTER — Other Ambulatory Visit: Payer: Medicare Other

## 2013-10-13 ENCOUNTER — Ambulatory Visit (HOSPITAL_BASED_OUTPATIENT_CLINIC_OR_DEPARTMENT_OTHER): Payer: Medicare Other

## 2013-10-13 ENCOUNTER — Ambulatory Visit (INDEPENDENT_AMBULATORY_CARE_PROVIDER_SITE_OTHER): Payer: Medicare Other | Admitting: General Surgery

## 2013-10-13 ENCOUNTER — Encounter (INDEPENDENT_AMBULATORY_CARE_PROVIDER_SITE_OTHER): Payer: Self-pay | Admitting: General Surgery

## 2013-10-13 ENCOUNTER — Ambulatory Visit: Payer: Medicare Other

## 2013-10-13 VITALS — BP 137/78 | HR 83 | Temp 98.0°F | Resp 19 | Ht 67.0 in

## 2013-10-13 VITALS — BP 138/86 | HR 80 | Temp 97.7°F | Resp 14 | Ht 67.0 in | Wt 184.4 lb

## 2013-10-13 DIAGNOSIS — C20 Malignant neoplasm of rectum: Secondary | ICD-10-CM

## 2013-10-13 DIAGNOSIS — Z5111 Encounter for antineoplastic chemotherapy: Secondary | ICD-10-CM

## 2013-10-13 MED ORDER — SODIUM CHLORIDE 0.9 % IV SOLN
Freq: Once | INTRAVENOUS | Status: AC
  Administered 2013-10-13: 11:00:00 via INTRAVENOUS

## 2013-10-13 MED ORDER — PROCHLORPERAZINE MALEATE 10 MG PO TABS
10.0000 mg | ORAL_TABLET | Freq: Once | ORAL | Status: AC
  Administered 2013-10-13: 10 mg via ORAL

## 2013-10-13 MED ORDER — PROCHLORPERAZINE MALEATE 10 MG PO TABS
ORAL_TABLET | ORAL | Status: AC
  Filled 2013-10-13: qty 1

## 2013-10-13 MED ORDER — FLUOROURACIL CHEMO INJECTION 2.5 GM/50ML
600.0000 mg/m2 | Freq: Once | INTRAVENOUS | Status: AC
  Administered 2013-10-13: 1200 mg via INTRAVENOUS
  Filled 2013-10-13: qty 24

## 2013-10-13 MED ORDER — LEUCOVORIN CALCIUM INJECTION 350 MG
500.0000 mg/m2 | Freq: Once | INTRAVENOUS | Status: AC
  Administered 2013-10-13: 980 mg via INTRAVENOUS
  Filled 2013-10-13: qty 49

## 2013-10-13 NOTE — Patient Instructions (Signed)
Followup in the office in 3 months.  If he began to have more formed or hard stools, decreased Imodium. Continue fibersupplement.

## 2013-10-13 NOTE — Progress Notes (Signed)
Jason [name] is a 68 y.o. male who is status post a loop ileostomy closeure on 2/18. He reports relatively good control but does have some leakage occasionally.  He denies any diarrhea. His stools are loose and not quite formed. His C. Difficile is better. His renal function appears to be stable. He is undergoing chemotherapy infusions with Dr. Benay Spice for adjuvant therapy after surgery. Objective:  Filed Vitals:   10/13/13 0957  BP: 138/86  Pulse: 80  Temp: 97.7 F (36.5 C)  Resp: 14   General appearance: alert and cooperative  GI: normal findings: soft, non-tender  ostomy site healing well.  Incision: healing well  Assessment:  s/p  Patient Active Problem List    Diagnosis  Date Noted   .  Rectal cancer  02/01/2013   .  Anemia  07/20/2012   .  Chronic renal insufficiency  07/31/2011   .  EDEMA  09/21/2010   .  OBSTRUCTIVE SLEEP APNEA  07/29/2010   .  GOUT  05/11/2007   .  ERECTILE DYSFUNCTION, MILD  05/11/2007   .  HYPERTENSION  05/11/2007   .  COLONIC POLYPS, HX OF  05/11/2007   Plan:  Jason [name] Is a 68 year old male who is status post Loop ileostomy reversal after a coloanal anastomosis for rectal cancer. He is currently undergoing chemotherapy with Dr. Benay Spice.  I will see him back in 3 months for his cancer followup. We will do a digital rectal exam at that time to check his anastomosis for any recurrence.

## 2013-10-13 NOTE — Patient Instructions (Signed)
[facility] Discharge Instructions for Patients Receiving Chemotherapy  Today you received the following chemotherapy agents 5FU/LV  To help prevent nausea and vomiting after your treatment, we encourage you to take your nausea medication as needed   If you develop nausea and vomiting that is not controlled by your nausea medication, call the clinic.   BELOW ARE SYMPTOMS THAT SHOULD BE REPORTED IMMEDIATELY:  *FEVER GREATER THAN 100.5 F  *CHILLS WITH OR WITHOUT FEVER  NAUSEA AND VOMITING THAT IS NOT CONTROLLED WITH YOUR NAUSEA MEDICATION  *UNUSUAL SHORTNESS OF BREATH  *UNUSUAL BRUISING OR BLEEDING  TENDERNESS IN MOUTH AND THROAT WITH OR WITHOUT PRESENCE OF ULCERS  *URINARY PROBLEMS  *BOWEL PROBLEMS  UNUSUAL RASH Items with * indicate a potential emergency and should be followed up as soon as possible.  Feel free to call the clinic you have any questions or concerns. The clinic phone number is [phone].

## 2013-10-23 ENCOUNTER — Other Ambulatory Visit: Payer: Self-pay | Admitting: Oncology

## 2013-10-24 ENCOUNTER — Telehealth: Payer: Self-pay | Admitting: Oncology

## 2013-10-24 ENCOUNTER — Ambulatory Visit (HOSPITAL_BASED_OUTPATIENT_CLINIC_OR_DEPARTMENT_OTHER): Payer: Medicare Other

## 2013-10-24 ENCOUNTER — Ambulatory Visit (HOSPITAL_BASED_OUTPATIENT_CLINIC_OR_DEPARTMENT_OTHER): Payer: Medicare Other | Admitting: Oncology

## 2013-10-24 ENCOUNTER — Other Ambulatory Visit (HOSPITAL_BASED_OUTPATIENT_CLINIC_OR_DEPARTMENT_OTHER): Payer: Medicare Other

## 2013-10-24 ENCOUNTER — Other Ambulatory Visit: Payer: Self-pay | Admitting: Oncology

## 2013-10-24 VITALS — BP 157/78 | HR 68 | Temp 97.7°F | Resp 18 | Ht 67.0 in | Wt 183.7 lb

## 2013-10-24 DIAGNOSIS — C20 Malignant neoplasm of rectum: Secondary | ICD-10-CM

## 2013-10-24 DIAGNOSIS — A0472 Enterocolitis due to Clostridium difficile, not specified as recurrent: Secondary | ICD-10-CM

## 2013-10-24 DIAGNOSIS — I10 Essential (primary) hypertension: Secondary | ICD-10-CM

## 2013-10-24 DIAGNOSIS — R609 Edema, unspecified: Secondary | ICD-10-CM

## 2013-10-24 DIAGNOSIS — D638 Anemia in other chronic diseases classified elsewhere: Secondary | ICD-10-CM

## 2013-10-24 DIAGNOSIS — D649 Anemia, unspecified: Secondary | ICD-10-CM

## 2013-10-24 DIAGNOSIS — N289 Disorder of kidney and ureter, unspecified: Secondary | ICD-10-CM

## 2013-10-24 DIAGNOSIS — R911 Solitary pulmonary nodule: Secondary | ICD-10-CM

## 2013-10-24 DIAGNOSIS — T451X5A Adverse effect of antineoplastic and immunosuppressive drugs, initial encounter: Secondary | ICD-10-CM

## 2013-10-24 DIAGNOSIS — Z5111 Encounter for antineoplastic chemotherapy: Secondary | ICD-10-CM

## 2013-10-24 DIAGNOSIS — D6481 Anemia due to antineoplastic chemotherapy: Secondary | ICD-10-CM

## 2013-10-24 LAB — COMPREHENSIVE METABOLIC PANEL (CC13)
ALK PHOS: 64 U/L (ref 40–150)
ALT: 10 U/L (ref 0–55)
AST: 14 U/L (ref 5–34)
Albumin: 3.3 g/dL — ABNORMAL LOW (ref 3.5–5.0)
Anion Gap: 10 mEq/L (ref 3–11)
BILIRUBIN TOTAL: 0.43 mg/dL (ref 0.20–1.20)
BUN: 22.4 mg/dL (ref 7.0–26.0)
CO2: 23 mEq/L (ref 22–29)
Calcium: 10.4 mg/dL (ref 8.4–10.4)
Chloride: 104 mEq/L (ref 98–109)
Creatinine: 1.6 mg/dL — ABNORMAL HIGH (ref 0.7–1.3)
Glucose: 92 mg/dl (ref 70–140)
Potassium: 4.2 mEq/L (ref 3.5–5.1)
Sodium: 137 mEq/L (ref 136–145)
Total Protein: 6.6 g/dL (ref 6.4–8.3)

## 2013-10-24 LAB — CBC WITH DIFFERENTIAL/PLATELET
BASO%: 1 % (ref 0.0–2.0)
Basophils Absolute: 0 10*3/uL (ref 0.0–0.1)
EOS%: 1.2 % (ref 0.0–7.0)
Eosinophils Absolute: 0.1 10*3/uL (ref 0.0–0.5)
HCT: 32.7 % — ABNORMAL LOW (ref 38.4–49.9)
HGB: 10.9 g/dL — ABNORMAL LOW (ref 13.0–17.1)
LYMPH%: 14.8 % (ref 14.0–49.0)
MCH: 30 pg (ref 27.2–33.4)
MCHC: 33.3 g/dL (ref 32.0–36.0)
MCV: 90.1 fL (ref 79.3–98.0)
MONO#: 0.7 10*3/uL (ref 0.1–0.9)
MONO%: 17.2 % — ABNORMAL HIGH (ref 0.0–14.0)
NEUT#: 2.8 10*3/uL (ref 1.5–6.5)
NEUT%: 65.8 % (ref 39.0–75.0)
NRBC: 0 % (ref 0–0)
Platelets: 179 10*3/uL (ref 140–400)
RBC: 3.63 10*6/uL — AB (ref 4.20–5.82)
RDW: 19.8 % — ABNORMAL HIGH (ref 11.0–14.6)
WBC: 4.2 10*3/uL (ref 4.0–10.3)
lymph#: 0.6 10*3/uL — ABNORMAL LOW (ref 0.9–3.3)

## 2013-10-24 LAB — TECHNOLOGIST REVIEW

## 2013-10-24 LAB — MAGNESIUM (CC13): Magnesium: 1.5 mg/dl (ref 1.5–2.5)

## 2013-10-24 MED ORDER — PROCHLORPERAZINE MALEATE 10 MG PO TABS
ORAL_TABLET | ORAL | Status: AC
  Filled 2013-10-24: qty 1

## 2013-10-24 MED ORDER — PROCHLORPERAZINE MALEATE 10 MG PO TABS
10.0000 mg | ORAL_TABLET | Freq: Once | ORAL | Status: AC
  Administered 2013-10-24: 10 mg via ORAL

## 2013-10-24 MED ORDER — LEUCOVORIN CALCIUM INJECTION 350 MG
500.0000 mg/m2 | Freq: Once | INTRAVENOUS | Status: AC
  Administered 2013-10-24: 980 mg via INTRAVENOUS
  Filled 2013-10-24: qty 49

## 2013-10-24 MED ORDER — SODIUM CHLORIDE 0.9 % IV SOLN
Freq: Once | INTRAVENOUS | Status: AC
  Administered 2013-10-24: 11:00:00 via INTRAVENOUS

## 2013-10-24 MED ORDER — FLUOROURACIL CHEMO INJECTION 2.5 GM/50ML
600.0000 mg/m2 | Freq: Once | INTRAVENOUS | Status: AC
  Administered 2013-10-24: 1200 mg via INTRAVENOUS
  Filled 2013-10-24: qty 24

## 2013-10-24 NOTE — Telephone Encounter (Signed)
gv and pritned appt sched and avs for pt for April May and June....sed added tx.

## 2013-10-24 NOTE — Progress Notes (Signed)
  [facility] OFFICE PROGRESS NOTE   Diagnosis: Rectal cancer  INTERVAL HISTORY:   Jason [name] returns as scheduled. He completed another cycle of 5-FU/leucovorin 10/13/2013. No mouth sores or nausea. He reports 1 episode of diarrhea per week. Hyperpigmentation of the hands. He feels well. HCTZ was discontinued by nephrology.  Objective:  Vital signs in last 24 hours:  Blood pressure 157/78, pulse 68, temperature 97.7 F (36.5 C), temperature source Oral, resp. rate 18, height 5\' 7"  (1.702 m), weight 183 lb 11.2 oz (83.326 kg), SpO2 100.00%.    HEENT: No thrush or ulcers Resp: Lungs clear bilaterally Cardio: Regular rate and rhythm GI: No hepatomegaly, nontender Vascular: Trace edema at the left lower leg  Skin: Hyperpigmentation of the hands, no erythema     Lab Results:  Lab Results  Component Value Date   WBC 4.2 10/24/2013   HGB 10.9* 10/24/2013   HCT 32.7* 10/24/2013   MCV 90.1 10/24/2013   PLT 179 10/24/2013   NEUTROABS 2.8 10/24/2013   Potassium 4.2, BUN 22.4, creatinine 1.6, calcium 10.4, albumin 3.3  Lab Results  Component Value Date   CEA 0.6 09/22/2013    Imaging:  No results found.  Medications: I have reviewed the patient's current medications.  Assessment/Plan: 1. Rectal cancer, clinical stage III (uT3, uN2).  Initiation of concurrent Xeloda and radiation on 02/21/2013, completed 03/31/2013.  CEA normal 04/25/2013  Status post laparoscopic low anterior resection with diverting loop ileostomy 05/23/2013. Final pathology showed a 3.2 cm invasive adenocarcinoma with neoadjuvant related change. Tumor invaded into perirectal soft tissue. There was no lymphovascular or perineural invasion. 14 lymph nodes were negative for tumor. Surgical margins were negative. There was one hyperplastic polyp (ypT3, pN0).  Initiation of weekly 5-FU/leucovorin 08/01/2013  Ileostomy takedown 08/24/2013.  Weekly 5-FU/leucovorin resumed 09/22/2013. 2. Markedly  elevated pretreat CEA, normal 07/21/2013, 09/22/2013. 3. Indeterminate pulmonary/subpleural nodules and right hepatic dome lesion. Unchanged on the CT 04/25/2013 4. Renal insufficiency. Acute on chronic renal failure during hospitalization November/December 2014. Improved following the ileostomy takedown. 5. Anemia secondary chemotherapy, chronic disease, and renal insufficiency 6. Hypertension. 7. Gout. 8. Prolonged postoperative ileus following laparoscopic low anterior resection with diverting loop ileostomy 05/23/2013. 9. C. difficile-positive 09/08/2013-status post Flagyl with improvement. Recurrent diarrhea following completion of Flagyl. Stool positive for C. difficile on 09/24/2013. Now on vancomycin. The diarrhea has resolved. 10. Left greater than right low leg edema. Venous Doppler 09/26/2013 negative for left leg DVT.    Disposition:  He appears to be tolerating the 5-FU/leucovorin well. The plan is to proceed with cycle 3 beginning today. He will complete a total of 4 cycles of adjuvant 5-FU/leucovorin.  Jason [name] will return for an office visit in one month.  [name], MD  10/24/2013  9:27 AM

## 2013-10-26 ENCOUNTER — Encounter: Payer: Self-pay | Admitting: Internal Medicine

## 2013-10-31 ENCOUNTER — Ambulatory Visit (HOSPITAL_BASED_OUTPATIENT_CLINIC_OR_DEPARTMENT_OTHER): Payer: Medicare Other

## 2013-10-31 ENCOUNTER — Other Ambulatory Visit (HOSPITAL_BASED_OUTPATIENT_CLINIC_OR_DEPARTMENT_OTHER): Payer: Medicare Other

## 2013-10-31 VITALS — BP 161/86 | HR 64 | Temp 97.1°F | Resp 18

## 2013-10-31 DIAGNOSIS — C20 Malignant neoplasm of rectum: Secondary | ICD-10-CM

## 2013-10-31 DIAGNOSIS — Z5111 Encounter for antineoplastic chemotherapy: Secondary | ICD-10-CM

## 2013-10-31 LAB — CBC WITH DIFFERENTIAL/PLATELET
BASO%: 0.5 % (ref 0.0–2.0)
BASOS ABS: 0 10*3/uL (ref 0.0–0.1)
EOS%: 0.8 % (ref 0.0–7.0)
Eosinophils Absolute: 0.1 10*3/uL (ref 0.0–0.5)
HCT: 32.8 % — ABNORMAL LOW (ref 38.4–49.9)
HGB: 10.8 g/dL — ABNORMAL LOW (ref 13.0–17.1)
LYMPH#: 0.9 10*3/uL (ref 0.9–3.3)
LYMPH%: 13.3 % — ABNORMAL LOW (ref 14.0–49.0)
MCH: 29.9 pg (ref 27.2–33.4)
MCHC: 32.9 g/dL (ref 32.0–36.0)
MCV: 90.9 fL (ref 79.3–98.0)
MONO#: 0.8 10*3/uL (ref 0.1–0.9)
MONO%: 11.9 % (ref 0.0–14.0)
NEUT#: 4.8 10*3/uL (ref 1.5–6.5)
NEUT%: 73.5 % (ref 39.0–75.0)
NRBC: 0 % (ref 0–0)
Platelets: 204 10*3/uL (ref 140–400)
RBC: 3.61 10*6/uL — AB (ref 4.20–5.82)
RDW: 19.3 % — ABNORMAL HIGH (ref 11.0–14.6)
WBC: 6.5 10*3/uL (ref 4.0–10.3)

## 2013-10-31 LAB — TECHNOLOGIST REVIEW

## 2013-10-31 MED ORDER — LEUCOVORIN CALCIUM INJECTION 350 MG
500.0000 mg/m2 | Freq: Once | INTRAMUSCULAR | Status: AC
  Administered 2013-10-31: 980 mg via INTRAVENOUS
  Filled 2013-10-31: qty 49

## 2013-10-31 MED ORDER — PROCHLORPERAZINE MALEATE 10 MG PO TABS
10.0000 mg | ORAL_TABLET | Freq: Once | ORAL | Status: AC
  Administered 2013-10-31: 10 mg via ORAL

## 2013-10-31 MED ORDER — SODIUM CHLORIDE 0.9 % IV SOLN
Freq: Once | INTRAVENOUS | Status: AC
  Administered 2013-10-31: 09:00:00 via INTRAVENOUS

## 2013-10-31 MED ORDER — FLUOROURACIL CHEMO INJECTION 2.5 GM/50ML
600.0000 mg/m2 | Freq: Once | INTRAVENOUS | Status: AC
  Administered 2013-10-31: 1200 mg via INTRAVENOUS
  Filled 2013-10-31: qty 24

## 2013-10-31 MED ORDER — PROCHLORPERAZINE MALEATE 10 MG PO TABS
ORAL_TABLET | ORAL | Status: AC
  Filled 2013-10-31: qty 1

## 2013-10-31 NOTE — Progress Notes (Signed)
Ok to treat today with CMET from 4/20 creatinine of 1.6, per Dr Benay Spice.

## 2013-10-31 NOTE — Patient Instructions (Signed)
[facility] Discharge Instructions for Patients Receiving Chemotherapy  Today you received the following chemotherapy agents leucovorin/fluorouracil.    To help prevent nausea and vomiting after your treatment, we encourage you to take your nausea medication as directed.     If you develop nausea and vomiting that is not controlled by your nausea medication, call the clinic.   BELOW ARE SYMPTOMS THAT SHOULD BE REPORTED IMMEDIATELY:  *FEVER GREATER THAN 100.5 F  *CHILLS WITH OR WITHOUT FEVER  NAUSEA AND VOMITING THAT IS NOT CONTROLLED WITH YOUR NAUSEA MEDICATION  *UNUSUAL SHORTNESS OF BREATH  *UNUSUAL BRUISING OR BLEEDING  TENDERNESS IN MOUTH AND THROAT WITH OR WITHOUT PRESENCE OF ULCERS  *URINARY PROBLEMS  *BOWEL PROBLEMS  UNUSUAL RASH Items with * indicate a potential emergency and should be followed up as soon as possible.  Feel free to call the clinic you have any questions or concerns. The clinic phone number is [phone].

## 2013-11-07 ENCOUNTER — Other Ambulatory Visit: Payer: Self-pay | Admitting: Nurse Practitioner

## 2013-11-07 ENCOUNTER — Other Ambulatory Visit (HOSPITAL_BASED_OUTPATIENT_CLINIC_OR_DEPARTMENT_OTHER): Payer: Medicare Other

## 2013-11-07 ENCOUNTER — Ambulatory Visit (HOSPITAL_BASED_OUTPATIENT_CLINIC_OR_DEPARTMENT_OTHER): Payer: Medicare Other

## 2013-11-07 VITALS — BP 183/77 | HR 96 | Temp 97.8°F | Resp 19

## 2013-11-07 DIAGNOSIS — C20 Malignant neoplasm of rectum: Secondary | ICD-10-CM

## 2013-11-07 DIAGNOSIS — Z5111 Encounter for antineoplastic chemotherapy: Secondary | ICD-10-CM

## 2013-11-07 LAB — CBC WITH DIFFERENTIAL/PLATELET
BASO%: 0.5 % (ref 0.0–2.0)
Basophils Absolute: 0 10*3/uL (ref 0.0–0.1)
EOS%: 2.3 % (ref 0.0–7.0)
Eosinophils Absolute: 0.2 10*3/uL (ref 0.0–0.5)
HEMATOCRIT: 30.8 % — AB (ref 38.4–49.9)
HGB: 10.1 g/dL — ABNORMAL LOW (ref 13.0–17.1)
LYMPH#: 0.7 10*3/uL — AB (ref 0.9–3.3)
LYMPH%: 10 % — ABNORMAL LOW (ref 14.0–49.0)
MCH: 30.5 pg (ref 27.2–33.4)
MCHC: 32.8 g/dL (ref 32.0–36.0)
MCV: 93.1 fL (ref 79.3–98.0)
MONO#: 0.8 10*3/uL (ref 0.1–0.9)
MONO%: 12.2 % (ref 0.0–14.0)
NEUT#: 5 10*3/uL (ref 1.5–6.5)
NEUT%: 75 % (ref 39.0–75.0)
NRBC: 0 % (ref 0–0)
Platelets: 157 10*3/uL (ref 140–400)
RBC: 3.31 10*6/uL — AB (ref 4.20–5.82)
RDW: 18.8 % — ABNORMAL HIGH (ref 11.0–14.6)
WBC: 6.6 10*3/uL (ref 4.0–10.3)

## 2013-11-07 LAB — TECHNOLOGIST REVIEW

## 2013-11-07 MED ORDER — FLUOROURACIL CHEMO INJECTION 2.5 GM/50ML
600.0000 mg/m2 | Freq: Once | INTRAVENOUS | Status: AC
  Administered 2013-11-07: 1200 mg via INTRAVENOUS
  Filled 2013-11-07: qty 24

## 2013-11-07 MED ORDER — PROCHLORPERAZINE MALEATE 10 MG PO TABS
ORAL_TABLET | ORAL | Status: AC
  Filled 2013-11-07: qty 1

## 2013-11-07 MED ORDER — SODIUM CHLORIDE 0.9 % IV SOLN
Freq: Once | INTRAVENOUS | Status: AC
  Administered 2013-11-07: 09:00:00 via INTRAVENOUS

## 2013-11-07 MED ORDER — PROCHLORPERAZINE MALEATE 10 MG PO TABS
10.0000 mg | ORAL_TABLET | Freq: Once | ORAL | Status: AC
  Administered 2013-11-07: 10 mg via ORAL

## 2013-11-07 MED ORDER — LEUCOVORIN CALCIUM INJECTION 350 MG
500.0000 mg/m2 | Freq: Once | INTRAVENOUS | Status: AC
  Administered 2013-11-07: 980 mg via INTRAVENOUS
  Filled 2013-11-07: qty 49

## 2013-11-07 NOTE — Patient Instructions (Signed)
[facility] Discharge Instructions for Patients Receiving Chemotherapy  Today you received the following chemotherapy agents: Leucovorin, 5FU (Adrucil)  To help prevent nausea and vomiting after your treatment, we encourage you to take your nausea medication as prescribed by your physician.    If you develop nausea and vomiting that is not controlled by your nausea medication, call the clinic.   BELOW ARE SYMPTOMS THAT SHOULD BE REPORTED IMMEDIATELY:  *FEVER GREATER THAN 100.5 F  *CHILLS WITH OR WITHOUT FEVER  NAUSEA AND VOMITING THAT IS NOT CONTROLLED WITH YOUR NAUSEA MEDICATION  *UNUSUAL SHORTNESS OF BREATH  *UNUSUAL BRUISING OR BLEEDING  TENDERNESS IN MOUTH AND THROAT WITH OR WITHOUT PRESENCE OF ULCERS  *URINARY PROBLEMS  *BOWEL PROBLEMS  UNUSUAL RASH Items with * indicate a potential emergency and should be followed up as soon as possible.  Feel free to call the clinic you have any questions or concerns. The clinic phone number is [phone].

## 2013-11-07 NOTE — Progress Notes (Signed)
Per Dr. Benay Spice, okay to use 10/24/13 CMET results with creatinine 1.6. CBC within treatment parameters. Blood return noted prior and after 5FU push administered half way through Leucovorin.

## 2013-11-08 ENCOUNTER — Telehealth: Payer: Self-pay | Admitting: *Deleted

## 2013-11-08 NOTE — Telephone Encounter (Signed)
Reports both feet are swollen equally-still able to wear his shoes, but "barely". Feet feel rough and thick-is using Eucerin cream often. No open sores or blisters on feet. Made him aware that chemo does not make feet swell and doubtful it is DVT since legs not swollen and he denies any dyspnea. Tells nurse he keeps his feet elevated and they are swollen upon arising each morning. He tells nurse he is not eating any salt. Prior was on HCTZ and this was discontinued by renal at time he was having diarrhea. Will relay to physician.

## 2013-11-08 NOTE — Telephone Encounter (Signed)
Left VM "my feet are swollen. Does the chemo do this?"

## 2013-11-09 ENCOUNTER — Telehealth: Payer: Self-pay | Admitting: *Deleted

## 2013-11-09 ENCOUNTER — Other Ambulatory Visit: Payer: Self-pay | Admitting: *Deleted

## 2013-11-09 NOTE — Telephone Encounter (Signed)
Notified that Dr. Benay Spice wants him to continue to hold his HCTZ for now. Elevate feet above heart as much as possible. Watch salt intake and drink water. Will have nurse look at his feet when he is here on Monday for next treatment.

## 2013-11-14 ENCOUNTER — Other Ambulatory Visit (HOSPITAL_BASED_OUTPATIENT_CLINIC_OR_DEPARTMENT_OTHER): Payer: Medicare Other

## 2013-11-14 ENCOUNTER — Ambulatory Visit (HOSPITAL_BASED_OUTPATIENT_CLINIC_OR_DEPARTMENT_OTHER): Payer: Medicare Other

## 2013-11-14 VITALS — BP 157/97 | HR 89 | Temp 98.2°F | Resp 18

## 2013-11-14 DIAGNOSIS — Z5111 Encounter for antineoplastic chemotherapy: Secondary | ICD-10-CM

## 2013-11-14 DIAGNOSIS — C20 Malignant neoplasm of rectum: Secondary | ICD-10-CM

## 2013-11-14 LAB — CBC WITH DIFFERENTIAL/PLATELET
BASO%: 0.5 % (ref 0.0–2.0)
Basophils Absolute: 0 10*3/uL (ref 0.0–0.1)
EOS ABS: 0.1 10*3/uL (ref 0.0–0.5)
EOS%: 1.6 % (ref 0.0–7.0)
HEMATOCRIT: 28.8 % — AB (ref 38.4–49.9)
HGB: 9.7 g/dL — ABNORMAL LOW (ref 13.0–17.1)
LYMPH#: 0.7 10*3/uL — AB (ref 0.9–3.3)
LYMPH%: 10.5 % — AB (ref 14.0–49.0)
MCH: 30.7 pg (ref 27.2–33.4)
MCHC: 33.7 g/dL (ref 32.0–36.0)
MCV: 91.1 fL (ref 79.3–98.0)
MONO#: 0.8 10*3/uL (ref 0.1–0.9)
MONO%: 12.1 % (ref 0.0–14.0)
NEUT#: 4.7 10*3/uL (ref 1.5–6.5)
NEUT%: 75.3 % — AB (ref 39.0–75.0)
Platelets: 170 10*3/uL (ref 140–400)
RBC: 3.16 10*6/uL — ABNORMAL LOW (ref 4.20–5.82)
RDW: 18.3 % — ABNORMAL HIGH (ref 11.0–14.6)
WBC: 6.2 10*3/uL (ref 4.0–10.3)
nRBC: 0 % (ref 0–0)

## 2013-11-14 LAB — TECHNOLOGIST REVIEW

## 2013-11-14 MED ORDER — FLUOROURACIL CHEMO INJECTION 2.5 GM/50ML
600.0000 mg/m2 | Freq: Once | INTRAVENOUS | Status: AC
  Administered 2013-11-14: 1200 mg via INTRAVENOUS
  Filled 2013-11-14: qty 24

## 2013-11-14 MED ORDER — PROCHLORPERAZINE MALEATE 10 MG PO TABS
ORAL_TABLET | ORAL | Status: AC
  Filled 2013-11-14: qty 1

## 2013-11-14 MED ORDER — LEUCOVORIN CALCIUM INJECTION 350 MG
500.0000 mg/m2 | Freq: Once | INTRAVENOUS | Status: AC
  Administered 2013-11-14: 980 mg via INTRAVENOUS
  Filled 2013-11-14: qty 49

## 2013-11-14 MED ORDER — SODIUM CHLORIDE 0.9 % IV SOLN
Freq: Once | INTRAVENOUS | Status: AC
  Administered 2013-11-14: 10:00:00 via INTRAVENOUS

## 2013-11-14 MED ORDER — PROCHLORPERAZINE MALEATE 10 MG PO TABS
10.0000 mg | ORAL_TABLET | Freq: Once | ORAL | Status: AC
  Administered 2013-11-14: 10 mg via ORAL

## 2013-11-14 NOTE — Patient Instructions (Signed)
[facility] Discharge Instructions for Patients Receiving Chemotherapy  Today you received the following chemotherapy agents: Leucovorin and 5FU   To help prevent nausea and vomiting after your treatment, we encourage you to take your nausea medication as prescribed.    If you develop nausea and vomiting that is not controlled by your nausea medication, call the clinic.   BELOW ARE SYMPTOMS THAT SHOULD BE REPORTED IMMEDIATELY:  *FEVER GREATER THAN 100.5 F  *CHILLS WITH OR WITHOUT FEVER  NAUSEA AND VOMITING THAT IS NOT CONTROLLED WITH YOUR NAUSEA MEDICATION  *UNUSUAL SHORTNESS OF BREATH  *UNUSUAL BRUISING OR BLEEDING  TENDERNESS IN MOUTH AND THROAT WITH OR WITHOUT PRESENCE OF ULCERS  *URINARY PROBLEMS  *BOWEL PROBLEMS  UNUSUAL RASH Items with * indicate a potential emergency and should be followed up as soon as possible.  Feel free to call the clinic you have any questions or concerns. The clinic phone number is [phone].

## 2013-11-14 NOTE — Progress Notes (Signed)
Pt with mild bilateral feet swelling. He states he reported this last week, see telephone note 11/08/13. Patient notes they have remained unchanged over the weekend, keeping legs elevated and avoiding salt. Patient denies calf pain or swelling. Dr. Benay Spice notified of the above. Per MD, patient to follow up with PCP and/or nephrologist as patient was recently taken off his diuretic. Patient verbalizes understanding and will follow up.

## 2013-11-17 ENCOUNTER — Ambulatory Visit: Payer: Medicare Other | Admitting: Radiation Oncology

## 2013-11-18 ENCOUNTER — Encounter: Payer: Self-pay | Admitting: Radiation Oncology

## 2013-11-18 ENCOUNTER — Ambulatory Visit
Admission: RE | Admit: 2013-11-18 | Discharge: 2013-11-18 | Disposition: A | Discharge status: Home / Self Care | Payer: Medicare Other | Source: Ambulatory Visit | Attending: Radiation Oncology | Admitting: Radiation Oncology

## 2013-11-18 VITALS — BP 164/91 | HR 89 | Temp 98.4°F | Resp 20 | Wt 194.6 lb

## 2013-11-18 DIAGNOSIS — C20 Malignant neoplasm of rectum: Secondary | ICD-10-CM

## 2013-11-18 NOTE — Progress Notes (Signed)
Pt denies pain, fatigue, loss of appetite, urinary issues. Pt states he has "off and on" diarrhea, takes Imodium prn w/good relief. He was unable to quantify how often a week he has diarrhea. He continues to receive chemotherapy, last infusion 11/14/13. He denies issues from chemotherapy.

## 2013-11-18 NOTE — Progress Notes (Signed)
  Radiation Oncology         [phone] ________________________________  Name: Jason [name] MRN: [id number]  Date: 11/18/2013  DOB: [birth year]  Follow-Up Visit Note  CC: Joycelyn Man, MD  Stark Klein, MD  Diagnosis:   Rectal cancer  Interval Since Last Radiation:  The patient completed radiation treatment in September of 2014   Narrative:  The patient returns today for routine follow-up.  The patient states that he is doing well. He had the ileostomy reversed and this has healed well. He has proceeded with additional chemotherapy. The patient really does not have any significant complaints regarding this. He does note approximately 3-4 stools per day but states that these are well formed. He takes Imodium as needed for diarrhea with good relief. Overall he feels great.                              ALLERGIES:  is allergic to nsaids.  Meds: Current Outpatient Prescriptions  Medication Sig Dispense Refill  . acetaminophen (TYLENOL) 325 MG tablet Take 2 tablets (650 mg total) by mouth every 4 (four) hours as needed for moderate pain.      Marland Kitchen allopurinol (ZYLOPRIM) 300 MG tablet Take 1 tablet (300 mg total) by mouth every morning.  100 tablet  3  . carvedilol (COREG) 6.25 MG tablet Take 1 tablet (6.25 mg total) by mouth 2 (two) times daily with a meal.  200 tablet  3  . doxazosin (CARDURA) 8 MG tablet Take 8 mg by mouth at bedtime.      Marland Kitchen loperamide (IMODIUM) 2 MG capsule Take 1 capsule (2 mg total) by mouth as needed for diarrhea or loose stools.  30 capsule  3  . magnesium oxide (MAG-OX) 400 (241.3 MG) MG tablet TAKE ONE TABLET BY MOUTH TWICE DAILY  60 tablet  0  . polycarbophil (FIBERCON) 625 MG tablet Take 1,250 mg by mouth daily.      . potassium chloride SA (K-DUR,KLOR-CON) 20 MEQ tablet Take 1 tablet (20 mEq total) by mouth daily.  30 tablet  2  . colchicine 0.6 MG tablet Take 0.5 tablets (0.3 mg total) by mouth daily as needed.  30 tablet  1  . HYDROcodone-acetaminophen  (NORCO) 5-325 MG per tablet Take 1 tablet by mouth every 6 (six) hours as needed.  30 tablet  0   No current facility-administered medications for this encounter.    Physical Findings: The patient is in no acute distress. Patient is alert and oriented.  weight is 194 lb 9.6 oz (88.27 kg). His oral temperature is 98.4 F (36.9 C). His blood pressure is 164/91 and his pulse is 89. His respiration is 20. Marland Kitchen   General: Well-developed, in no acute distress HEENT: Normocephalic, atraumatic Cardiovascular: Regular rate and rhythm Respiratory: Clear to auscultation bilaterally GI: Soft, nontender, normal bowel sounds Extremities: No edema present   Lab Findings: Lab Results  Component Value Date   WBC 6.2 11/14/2013   HGB 9.7* 11/14/2013   HCT 28.8* 11/14/2013   MCV 91.1 11/14/2013   PLT 170 11/14/2013     Radiographic Findings: No results found.  Impression:    The patient is doing very well at this time. He is clinically NED and symptomatically is doing very well.  Plan:  The patient will followup in our clinic on a when necessary basis.   Jodelle Gross, M.D., Ph.D.

## 2013-11-21 ENCOUNTER — Encounter: Payer: Self-pay | Admitting: Family Medicine

## 2013-11-21 ENCOUNTER — Telehealth: Payer: Self-pay | Admitting: *Deleted

## 2013-11-21 ENCOUNTER — Ambulatory Visit (INDEPENDENT_AMBULATORY_CARE_PROVIDER_SITE_OTHER): Payer: Medicare Other | Admitting: Family Medicine

## 2013-11-21 VITALS — BP 160/90 | Temp 98.7°F | Wt 194.0 lb

## 2013-11-21 DIAGNOSIS — N189 Chronic kidney disease, unspecified: Secondary | ICD-10-CM

## 2013-11-21 DIAGNOSIS — R609 Edema, unspecified: Secondary | ICD-10-CM

## 2013-11-21 MED ORDER — HYDROCHLOROTHIAZIDE 12.5 MG PO TABS: 12.5000 mg | ORAL_TABLET | Freq: Every day | ORAL | Status: DC

## 2013-11-21 NOTE — Progress Notes (Signed)
   Subjective:    Patient ID: RENATO SPELLMAN, male    DOB: 1945/08/03, 68 y.o.   MRN: [id number]  HPI  Camara is a 68 year old male who comes in today for evaluation of peripheral edema. This is nonacute but it's been a long-standing problem. We had him on hydrochlorothiazide 25 mg daily with a potassium supplement. However his medication was discontinued at the cancer center. He said his cancer doctor set was affecting his kidney function and low potassium.     Review of Systems    review of systems otherwise negative except she's not on a salt free diet Objective:   Physical Exam  Well-developed well-nourished male no acute distress vital signs stable he is afebrile examination lower extremity shows skin to be normal pulses to be 1+ and symmetrical. 1 to trace peripheral edema.      Assessment & Plan:  Lower extremity peripheral edema,,,,,,,,,,, hydrochlorothiazide 12.5 mg Monday Wednesday Friday

## 2013-11-21 NOTE — Telephone Encounter (Signed)
Pt called stating he saw Dr Sherren Mocha today and that Dr Sherren Mocha wants pt to have a BMET drawn when he comes in for his lab work with Dr Benay Spice next week.  Informed pt that we will be checking a CMET when he comes in on 5/26.  SLJ

## 2013-11-21 NOTE — Progress Notes (Signed)
Pre visit review using our clinic review tool, if applicable. No additional management support is needed unless otherwise documented below in the visit note. 

## 2013-11-21 NOTE — Patient Instructions (Signed)
Hydrochlorothiazide 12.5 mg.......... take 1 tablet Monday Wednesday Friday  Followup up bmet at your next visit to the [facility] when you have your routine blood draw

## 2013-11-28 ENCOUNTER — Other Ambulatory Visit: Payer: Self-pay | Admitting: Oncology

## 2013-11-29 ENCOUNTER — Telehealth: Payer: Self-pay | Admitting: Oncology

## 2013-11-29 ENCOUNTER — Ambulatory Visit (HOSPITAL_BASED_OUTPATIENT_CLINIC_OR_DEPARTMENT_OTHER): Payer: Medicare Other

## 2013-11-29 ENCOUNTER — Other Ambulatory Visit (HOSPITAL_BASED_OUTPATIENT_CLINIC_OR_DEPARTMENT_OTHER): Payer: Medicare Other

## 2013-11-29 ENCOUNTER — Ambulatory Visit (HOSPITAL_BASED_OUTPATIENT_CLINIC_OR_DEPARTMENT_OTHER): Payer: Medicare Other | Admitting: Nurse Practitioner

## 2013-11-29 VITALS — BP 155/83 | HR 82 | Temp 98.3°F | Resp 18 | Ht 67.0 in | Wt 193.9 lb

## 2013-11-29 DIAGNOSIS — T451X5A Adverse effect of antineoplastic and immunosuppressive drugs, initial encounter: Secondary | ICD-10-CM

## 2013-11-29 DIAGNOSIS — N039 Chronic nephritic syndrome with unspecified morphologic changes: Secondary | ICD-10-CM

## 2013-11-29 DIAGNOSIS — C20 Malignant neoplasm of rectum: Secondary | ICD-10-CM

## 2013-11-29 DIAGNOSIS — D6481 Anemia due to antineoplastic chemotherapy: Secondary | ICD-10-CM

## 2013-11-29 DIAGNOSIS — D631 Anemia in chronic kidney disease: Secondary | ICD-10-CM

## 2013-11-29 DIAGNOSIS — Z5111 Encounter for antineoplastic chemotherapy: Secondary | ICD-10-CM

## 2013-11-29 DIAGNOSIS — M109 Gout, unspecified: Secondary | ICD-10-CM

## 2013-11-29 DIAGNOSIS — N189 Chronic kidney disease, unspecified: Secondary | ICD-10-CM

## 2013-11-29 LAB — COMPREHENSIVE METABOLIC PANEL (CC13)
ALK PHOS: 59 U/L (ref 40–150)
ALT: 10 U/L (ref 0–55)
AST: 12 U/L (ref 5–34)
Albumin: 3.3 g/dL — ABNORMAL LOW (ref 3.5–5.0)
Anion Gap: 9 mEq/L (ref 3–11)
BUN: 24.4 mg/dL (ref 7.0–26.0)
CALCIUM: 9.7 mg/dL (ref 8.4–10.4)
CHLORIDE: 105 meq/L (ref 98–109)
CO2: 22 mEq/L (ref 22–29)
Creatinine: 1.7 mg/dL — ABNORMAL HIGH (ref 0.7–1.3)
Glucose: 94 mg/dl (ref 70–140)
Potassium: 4.1 mEq/L (ref 3.5–5.1)
SODIUM: 136 meq/L (ref 136–145)
Total Bilirubin: 0.3 mg/dL (ref 0.20–1.20)
Total Protein: 6.3 g/dL — ABNORMAL LOW (ref 6.4–8.3)

## 2013-11-29 LAB — CBC WITH DIFFERENTIAL/PLATELET
BASO%: 0.4 % (ref 0.0–2.0)
Basophils Absolute: 0 10*3/uL (ref 0.0–0.1)
EOS%: 1.5 % (ref 0.0–7.0)
Eosinophils Absolute: 0.1 10*3/uL (ref 0.0–0.5)
HCT: 31.8 % — ABNORMAL LOW (ref 38.4–49.9)
HEMOGLOBIN: 10.5 g/dL — AB (ref 13.0–17.1)
LYMPH#: 0.7 10*3/uL — AB (ref 0.9–3.3)
LYMPH%: 15.3 % (ref 14.0–49.0)
MCH: 30.7 pg (ref 27.2–33.4)
MCHC: 33 g/dL (ref 32.0–36.0)
MCV: 93 fL (ref 79.3–98.0)
MONO#: 0.8 10*3/uL (ref 0.1–0.9)
MONO%: 16.6 % — ABNORMAL HIGH (ref 0.0–14.0)
NEUT#: 3.1 10*3/uL (ref 1.5–6.5)
NEUT%: 66.2 % (ref 39.0–75.0)
Platelets: 199 10*3/uL (ref 140–400)
RBC: 3.42 10*6/uL — AB (ref 4.20–5.82)
RDW: 17.2 % — AB (ref 11.0–14.6)
WBC: 4.7 10*3/uL (ref 4.0–10.3)

## 2013-11-29 LAB — MAGNESIUM (CC13): Magnesium: 1.5 mg/dl (ref 1.5–2.5)

## 2013-11-29 MED ORDER — PROCHLORPERAZINE MALEATE 10 MG PO TABS
ORAL_TABLET | ORAL | Status: AC
  Filled 2013-11-29: qty 1

## 2013-11-29 MED ORDER — PROCHLORPERAZINE MALEATE 10 MG PO TABS
10.0000 mg | ORAL_TABLET | Freq: Once | ORAL | Status: AC
  Administered 2013-11-29: 10 mg via ORAL

## 2013-11-29 MED ORDER — FLUOROURACIL CHEMO INJECTION 2.5 GM/50ML
600.0000 mg/m2 | Freq: Once | INTRAVENOUS | Status: AC
  Administered 2013-11-29: 1200 mg via INTRAVENOUS
  Filled 2013-11-29: qty 24

## 2013-11-29 MED ORDER — SODIUM CHLORIDE 0.9 % IV SOLN
Freq: Once | INTRAVENOUS | Status: AC
  Administered 2013-11-29: 11:00:00 via INTRAVENOUS

## 2013-11-29 MED ORDER — LEUCOVORIN CALCIUM INJECTION 350 MG
500.0000 mg/m2 | Freq: Once | INTRAVENOUS | Status: AC
  Administered 2013-11-29: 980 mg via INTRAVENOUS
  Filled 2013-11-29: qty 49

## 2013-11-29 NOTE — Progress Notes (Signed)
  [place] OFFICE PROGRESS NOTE   Diagnosis:  Rectal cancer.  INTERVAL HISTORY:   Jason [name] returns as scheduled. He feels well. He denies nausea/vomiting. No mouth sores. No diarrhea. No hand or foot pain or redness. He notes scattered areas of hyperpigmentation on the hands and forearms. He reports leg edema has resolved since resuming hydrochlorothiazide on a Monday Wednesday Friday schedule.  Objective:  Vital signs in last 24 hours:  Blood pressure 155/83, pulse 82, temperature 98.3 F (36.8 C), temperature source Oral, resp. rate 18, height 5\' 7"  (1.702 m), weight 193 lb 14.4 oz (87.952 kg).    HEENT: No thrush or ulcerations. Resp: Lungs clear. Cardio: Regular cardiac rhythm. GI: Soft and nontender. No hepatomegaly. Vascular: Trace lower leg edema bilaterally. Skin: Scattered areas of hyperpigmentation over the hands and forearms. Palms without erythema.  Lab Results:  Lab Results  Component Value Date   WBC 4.7 11/29/2013   HGB 10.5* 11/29/2013   HCT 31.8* 11/29/2013   MCV 93.0 11/29/2013   PLT 199 11/29/2013   NEUTROABS 3.1 11/29/2013   BUN 24.4, creatinine 1.7, potassium 4.1, calcium 9.7, albumin 3.3. Imaging:  No results found.  Medications: I have reviewed the patient's current medications.  Assessment/Plan: 1. Rectal cancer, clinical stage III (uT3, uN2).  Initiation of concurrent Xeloda and radiation on 02/21/2013, completed 03/31/2013.  CEA normal 04/25/2013  Status post laparoscopic low anterior resection with diverting loop ileostomy 05/23/2013. Final pathology showed a 3.2 cm invasive adenocarcinoma with neoadjuvant related change. Tumor invaded into perirectal soft tissue. There was no lymphovascular or perineural invasion. 14 lymph nodes were negative for tumor. Surgical margins were negative. There was one hyperplastic polyp (ypT3, pN0).  Initiation of weekly 5-FU/leucovorin 08/01/2013  Ileostomy takedown 08/24/2013.  Weekly  5-FU/leucovorin resumed 09/22/2013. 2. Markedly elevated pretreat CEA, normal 07/21/2013, 09/22/2013. 3. Indeterminate pulmonary/subpleural nodules and right hepatic dome lesion. Unchanged on the CT 04/25/2013 4. Renal insufficiency. Acute on chronic renal failure during hospitalization November/December 2014. Improved following the ileostomy takedown. 5. Anemia secondary chemotherapy, chronic disease, and renal insufficiency 6. Hypertension. 7. Gout. 8. Prolonged postoperative ileus following laparoscopic low anterior resection with diverting loop ileostomy 05/23/2013. 9. C. difficile-positive 09/08/2013-status post Flagyl with improvement. Recurrent diarrhea following completion of Flagyl. Stool positive for C. difficile on 09/24/2013. He completed treatment with vancomycin. The diarrhea resolved. 10. Left greater than right low leg edema. Venous Doppler 09/26/2013 negative for left leg DVT.   Disposition: He appears stable. Plan to begin the fourth and final cycle of adjuvant 5-FU/leucovorin today as scheduled (weekly x4). We will see him in followup prior to the last treatment on 12/19/2013. He will contact the office in the interim with any problems.  Plan reviewed with Dr. Benay Spice.    Owens Shark ANP/GNP-BC   11/29/2013  10:52 AM

## 2013-11-29 NOTE — Telephone Encounter (Signed)
gv wife appt schedule for june.

## 2013-12-05 ENCOUNTER — Other Ambulatory Visit (HOSPITAL_BASED_OUTPATIENT_CLINIC_OR_DEPARTMENT_OTHER): Payer: Medicare Other

## 2013-12-05 ENCOUNTER — Ambulatory Visit (HOSPITAL_BASED_OUTPATIENT_CLINIC_OR_DEPARTMENT_OTHER): Payer: Medicare Other

## 2013-12-05 VITALS — BP 162/92 | HR 80 | Temp 98.5°F

## 2013-12-05 DIAGNOSIS — C20 Malignant neoplasm of rectum: Secondary | ICD-10-CM

## 2013-12-05 DIAGNOSIS — T451X5A Adverse effect of antineoplastic and immunosuppressive drugs, initial encounter: Secondary | ICD-10-CM

## 2013-12-05 DIAGNOSIS — Z5111 Encounter for antineoplastic chemotherapy: Secondary | ICD-10-CM

## 2013-12-05 DIAGNOSIS — N189 Chronic kidney disease, unspecified: Secondary | ICD-10-CM

## 2013-12-05 DIAGNOSIS — D6481 Anemia due to antineoplastic chemotherapy: Secondary | ICD-10-CM

## 2013-12-05 LAB — CBC WITH DIFFERENTIAL/PLATELET
BASO%: 0.7 % (ref 0.0–2.0)
BASOS ABS: 0 10*3/uL (ref 0.0–0.1)
EOS%: 1.7 % (ref 0.0–7.0)
Eosinophils Absolute: 0.1 10*3/uL (ref 0.0–0.5)
HCT: 32.8 % — ABNORMAL LOW (ref 38.4–49.9)
HEMOGLOBIN: 10.8 g/dL — AB (ref 13.0–17.1)
LYMPH#: 0.3 10*3/uL — AB (ref 0.9–3.3)
LYMPH%: 6.3 % — ABNORMAL LOW (ref 14.0–49.0)
MCH: 31.8 pg (ref 27.2–33.4)
MCHC: 32.9 g/dL (ref 32.0–36.0)
MCV: 96.8 fL (ref 79.3–98.0)
MONO#: 0.9 10*3/uL (ref 0.1–0.9)
MONO%: 17.4 % — AB (ref 0.0–14.0)
NEUT#: 3.7 10*3/uL (ref 1.5–6.5)
NEUT%: 73.9 % (ref 39.0–75.0)
Platelets: 180 10*3/uL (ref 140–400)
RBC: 3.38 10*6/uL — ABNORMAL LOW (ref 4.20–5.82)
RDW: 18.7 % — ABNORMAL HIGH (ref 11.0–14.6)
WBC: 5.1 10*3/uL (ref 4.0–10.3)

## 2013-12-05 MED ORDER — PROCHLORPERAZINE MALEATE 10 MG PO TABS
ORAL_TABLET | ORAL | Status: AC
  Filled 2013-12-05: qty 1

## 2013-12-05 MED ORDER — DEXTROSE 5 % IV SOLN
500.0000 mg/m2 | Freq: Once | INTRAVENOUS | Status: AC
  Administered 2013-12-05: 980 mg via INTRAVENOUS
  Filled 2013-12-05: qty 49

## 2013-12-05 MED ORDER — FLUOROURACIL CHEMO INJECTION 2.5 GM/50ML
600.0000 mg/m2 | Freq: Once | INTRAVENOUS | Status: AC
  Administered 2013-12-05: 1200 mg via INTRAVENOUS
  Filled 2013-12-05: qty 24

## 2013-12-05 MED ORDER — SODIUM CHLORIDE 0.9 % IV SOLN
Freq: Once | INTRAVENOUS | Status: AC
  Administered 2013-12-05: 09:00:00 via INTRAVENOUS

## 2013-12-05 MED ORDER — PROCHLORPERAZINE MALEATE 10 MG PO TABS
10.0000 mg | ORAL_TABLET | Freq: Once | ORAL | Status: AC
  Administered 2013-12-05: 10 mg via ORAL

## 2013-12-05 NOTE — Patient Instructions (Signed)
[place] Discharge Instructions for Patients Receiving Chemotherapy  Today you received the following chemotherapy agents leucovorin/fluorouracil.    To help prevent nausea and vomiting after your treatment, we encourage you to take your nausea medication as directed.     If you develop nausea and vomiting that is not controlled by your nausea medication, call the clinic.   BELOW ARE SYMPTOMS THAT SHOULD BE REPORTED IMMEDIATELY:  *FEVER GREATER THAN 100.5 F  *CHILLS WITH OR WITHOUT FEVER  NAUSEA AND VOMITING THAT IS NOT CONTROLLED WITH YOUR NAUSEA MEDICATION  *UNUSUAL SHORTNESS OF BREATH  *UNUSUAL BRUISING OR BLEEDING  TENDERNESS IN MOUTH AND THROAT WITH OR WITHOUT PRESENCE OF ULCERS  *URINARY PROBLEMS  *BOWEL PROBLEMS  UNUSUAL RASH Items with * indicate a potential emergency and should be followed up as soon as possible.  Feel free to call the clinic you have any questions or concerns. The clinic phone number is [phone].

## 2013-12-06 ENCOUNTER — Encounter: Payer: Self-pay | Admitting: *Deleted

## 2013-12-06 NOTE — Progress Notes (Signed)
[place] Work  Holiday representative received referral for patients request to complete Regulatory affairs officer.  CSW met with Jason [name] and Jason [name]'s wife in the infusion room to offer support and review ADR booklet.  CSW, Jason [name], and Jason [name]'s wife reviewed and completed health care power of attorney and living will documents for both the patient and his wife.  Documents were witness, notarized, and will be placed in the Jason [name]'s medical record.  Copies were also provided to Jason [name] and Jason [name]'s wife.    [name], MSW, [place] Worker [facility] [phone]

## 2013-12-12 ENCOUNTER — Other Ambulatory Visit (HOSPITAL_BASED_OUTPATIENT_CLINIC_OR_DEPARTMENT_OTHER): Payer: Medicare Other

## 2013-12-12 ENCOUNTER — Ambulatory Visit (HOSPITAL_BASED_OUTPATIENT_CLINIC_OR_DEPARTMENT_OTHER): Payer: Medicare Other

## 2013-12-12 VITALS — BP 162/83 | HR 71 | Temp 98.1°F | Resp 18

## 2013-12-12 DIAGNOSIS — Z5111 Encounter for antineoplastic chemotherapy: Secondary | ICD-10-CM

## 2013-12-12 DIAGNOSIS — C20 Malignant neoplasm of rectum: Secondary | ICD-10-CM

## 2013-12-12 LAB — CBC WITH DIFFERENTIAL/PLATELET
BASO%: 0.6 % (ref 0.0–2.0)
BASOS ABS: 0 10*3/uL (ref 0.0–0.1)
EOS%: 2.4 % (ref 0.0–7.0)
Eosinophils Absolute: 0.1 10*3/uL (ref 0.0–0.5)
HCT: 32.4 % — ABNORMAL LOW (ref 38.4–49.9)
HGB: 10.7 g/dL — ABNORMAL LOW (ref 13.0–17.1)
LYMPH%: 6.4 % — ABNORMAL LOW (ref 14.0–49.0)
MCH: 32.1 pg (ref 27.2–33.4)
MCHC: 33 g/dL (ref 32.0–36.0)
MCV: 97.2 fL (ref 79.3–98.0)
MONO#: 1 10*3/uL — ABNORMAL HIGH (ref 0.1–0.9)
MONO%: 18.4 % — AB (ref 0.0–14.0)
NEUT#: 4 10*3/uL (ref 1.5–6.5)
NEUT%: 72.2 % (ref 39.0–75.0)
Platelets: 177 10*3/uL (ref 140–400)
RBC: 3.34 10*6/uL — ABNORMAL LOW (ref 4.20–5.82)
RDW: 18.6 % — AB (ref 11.0–14.6)
WBC: 5.5 10*3/uL (ref 4.0–10.3)
lymph#: 0.4 10*3/uL — ABNORMAL LOW (ref 0.9–3.3)

## 2013-12-12 MED ORDER — FLUOROURACIL CHEMO INJECTION 2.5 GM/50ML
600.0000 mg/m2 | Freq: Once | INTRAVENOUS | Status: AC
  Administered 2013-12-12: 1200 mg via INTRAVENOUS
  Filled 2013-12-12: qty 24

## 2013-12-12 MED ORDER — SODIUM CHLORIDE 0.9 % IV SOLN
Freq: Once | INTRAVENOUS | Status: AC
  Administered 2013-12-12: 10:00:00 via INTRAVENOUS

## 2013-12-12 MED ORDER — LEUCOVORIN CALCIUM INJECTION 350 MG
500.0000 mg/m2 | Freq: Once | INTRAVENOUS | Status: AC
  Administered 2013-12-12: 980 mg via INTRAVENOUS
  Filled 2013-12-12: qty 49

## 2013-12-12 MED ORDER — PROCHLORPERAZINE MALEATE 10 MG PO TABS
ORAL_TABLET | ORAL | Status: AC
  Filled 2013-12-12: qty 1

## 2013-12-12 MED ORDER — PROCHLORPERAZINE MALEATE 10 MG PO TABS
10.0000 mg | ORAL_TABLET | Freq: Once | ORAL | Status: AC
  Administered 2013-12-12: 10 mg via ORAL

## 2013-12-12 NOTE — Progress Notes (Signed)
Per Dr. Benay Spice okay to treat without CMET.   12:20: Positive blood return noted before, during, and after 5FU administration per protocol.

## 2013-12-12 NOTE — Patient Instructions (Signed)
[place] Discharge Instructions for Patients Receiving Chemotherapy  Today you received the following chemotherapy agents Leucovorin, Adrucil. . To help prevent nausea and vomiting after your treatment, we encourage you to take your nausea medication as prescribed.   If you develop nausea and vomiting that is not controlled by your nausea medication, call the clinic.   BELOW ARE SYMPTOMS THAT SHOULD BE REPORTED IMMEDIATELY:  *FEVER GREATER THAN 100.5 F  *CHILLS WITH OR WITHOUT FEVER  NAUSEA AND VOMITING THAT IS NOT CONTROLLED WITH YOUR NAUSEA MEDICATION  *UNUSUAL SHORTNESS OF BREATH  *UNUSUAL BRUISING OR BLEEDING  TENDERNESS IN MOUTH AND THROAT WITH OR WITHOUT PRESENCE OF ULCERS  *URINARY PROBLEMS  *BOWEL PROBLEMS  UNUSUAL RASH Items with * indicate a potential emergency and should be followed up as soon as possible.  Feel free to call the clinic you have any questions or concerns. The clinic phone number is [phone].

## 2013-12-19 ENCOUNTER — Other Ambulatory Visit: Payer: Medicare Other

## 2013-12-19 ENCOUNTER — Ambulatory Visit (HOSPITAL_BASED_OUTPATIENT_CLINIC_OR_DEPARTMENT_OTHER): Payer: Medicare Other

## 2013-12-19 ENCOUNTER — Other Ambulatory Visit (HOSPITAL_BASED_OUTPATIENT_CLINIC_OR_DEPARTMENT_OTHER): Payer: Medicare Other

## 2013-12-19 ENCOUNTER — Ambulatory Visit (HOSPITAL_BASED_OUTPATIENT_CLINIC_OR_DEPARTMENT_OTHER): Payer: Medicare Other | Admitting: Nurse Practitioner

## 2013-12-19 ENCOUNTER — Telehealth: Payer: Self-pay | Admitting: Oncology

## 2013-12-19 VITALS — BP 161/88 | HR 69 | Temp 97.5°F | Resp 18 | Ht 67.0 in | Wt 198.5 lb

## 2013-12-19 DIAGNOSIS — K7689 Other specified diseases of liver: Secondary | ICD-10-CM

## 2013-12-19 DIAGNOSIS — I10 Essential (primary) hypertension: Secondary | ICD-10-CM

## 2013-12-19 DIAGNOSIS — C20 Malignant neoplasm of rectum: Secondary | ICD-10-CM

## 2013-12-19 DIAGNOSIS — D638 Anemia in other chronic diseases classified elsewhere: Secondary | ICD-10-CM

## 2013-12-19 DIAGNOSIS — Z5111 Encounter for antineoplastic chemotherapy: Secondary | ICD-10-CM

## 2013-12-19 DIAGNOSIS — R918 Other nonspecific abnormal finding of lung field: Secondary | ICD-10-CM

## 2013-12-19 DIAGNOSIS — D6481 Anemia due to antineoplastic chemotherapy: Secondary | ICD-10-CM

## 2013-12-19 DIAGNOSIS — T451X5A Adverse effect of antineoplastic and immunosuppressive drugs, initial encounter: Secondary | ICD-10-CM

## 2013-12-19 DIAGNOSIS — N289 Disorder of kidney and ureter, unspecified: Secondary | ICD-10-CM

## 2013-12-19 LAB — CBC WITH DIFFERENTIAL/PLATELET
BASO%: 0.2 % (ref 0.0–2.0)
BASOS ABS: 0 10*3/uL (ref 0.0–0.1)
EOS%: 1.5 % (ref 0.0–7.0)
Eosinophils Absolute: 0.1 10*3/uL (ref 0.0–0.5)
HCT: 31 % — ABNORMAL LOW (ref 38.4–49.9)
HEMOGLOBIN: 10.3 g/dL — AB (ref 13.0–17.1)
LYMPH%: 13.2 % — ABNORMAL LOW (ref 14.0–49.0)
MCH: 31.1 pg (ref 27.2–33.4)
MCHC: 33.2 g/dL (ref 32.0–36.0)
MCV: 93.7 fL (ref 79.3–98.0)
MONO#: 0.7 10*3/uL (ref 0.1–0.9)
MONO%: 14.6 % — AB (ref 0.0–14.0)
NEUT#: 3.4 10*3/uL (ref 1.5–6.5)
NEUT%: 70.5 % (ref 39.0–75.0)
PLATELETS: 181 10*3/uL (ref 140–400)
RBC: 3.31 10*6/uL — ABNORMAL LOW (ref 4.20–5.82)
RDW: 17.1 % — AB (ref 11.0–14.6)
WBC: 4.8 10*3/uL (ref 4.0–10.3)
lymph#: 0.6 10*3/uL — ABNORMAL LOW (ref 0.9–3.3)

## 2013-12-19 LAB — COMPREHENSIVE METABOLIC PANEL (CC13)
ALK PHOS: 59 U/L (ref 40–150)
ALT: 9 U/L (ref 0–55)
AST: 12 U/L (ref 5–34)
Albumin: 3.5 g/dL (ref 3.5–5.0)
Anion Gap: 10 mEq/L (ref 3–11)
BUN: 19.2 mg/dL (ref 7.0–26.0)
CO2: 25 mEq/L (ref 22–29)
Calcium: 9.7 mg/dL (ref 8.4–10.4)
Chloride: 104 mEq/L (ref 98–109)
Creatinine: 1.8 mg/dL — ABNORMAL HIGH (ref 0.7–1.3)
GLUCOSE: 93 mg/dL (ref 70–140)
Potassium: 4.4 mEq/L (ref 3.5–5.1)
Sodium: 139 mEq/L (ref 136–145)
Total Bilirubin: 0.38 mg/dL (ref 0.20–1.20)
Total Protein: 6.5 g/dL (ref 6.4–8.3)

## 2013-12-19 MED ORDER — LEUCOVORIN CALCIUM INJECTION 350 MG
500.0000 mg/m2 | Freq: Once | INTRAVENOUS | Status: AC
  Administered 2013-12-19: 980 mg via INTRAVENOUS
  Filled 2013-12-19: qty 49

## 2013-12-19 MED ORDER — SODIUM CHLORIDE 0.9 % IV SOLN
Freq: Once | INTRAVENOUS | Status: AC
  Administered 2013-12-19: 10:00:00 via INTRAVENOUS

## 2013-12-19 MED ORDER — PROCHLORPERAZINE MALEATE 10 MG PO TABS
10.0000 mg | ORAL_TABLET | Freq: Once | ORAL | Status: AC
  Administered 2013-12-19: 10 mg via ORAL

## 2013-12-19 MED ORDER — PROCHLORPERAZINE MALEATE 10 MG PO TABS
ORAL_TABLET | ORAL | Status: AC
  Filled 2013-12-19: qty 1

## 2013-12-19 MED ORDER — FLUOROURACIL CHEMO INJECTION 2.5 GM/50ML
600.0000 mg/m2 | Freq: Once | INTRAVENOUS | Status: AC
  Administered 2013-12-19: 1200 mg via INTRAVENOUS
  Filled 2013-12-19: qty 24

## 2013-12-19 NOTE — Patient Instructions (Signed)
[facility] Discharge Instructions for Patients Receiving Chemotherapy  Today you received the following chemotherapy agents:  Leucovorin and 5FU.  To help prevent nausea and vomiting after your treatment, we encourage you to take your nausea medication as ordered per MD.   If you develop nausea and vomiting that is not controlled by your nausea medication, call the clinic.   BELOW ARE SYMPTOMS THAT SHOULD BE REPORTED IMMEDIATELY:  *FEVER GREATER THAN 100.5 F  *CHILLS WITH OR WITHOUT FEVER  NAUSEA AND VOMITING THAT IS NOT CONTROLLED WITH YOUR NAUSEA MEDICATION  *UNUSUAL SHORTNESS OF BREATH  *UNUSUAL BRUISING OR BLEEDING  TENDERNESS IN MOUTH AND THROAT WITH OR WITHOUT PRESENCE OF ULCERS  *URINARY PROBLEMS  *BOWEL PROBLEMS  UNUSUAL RASH Items with * indicate a potential emergency and should be followed up as soon as possible.  Feel free to call the clinic you have any questions or concerns. The clinic phone number is [phone].    

## 2013-12-19 NOTE — Telephone Encounter (Signed)
gv adn printed appt sched and avs for pt for Aug...gv pt barium

## 2013-12-19 NOTE — Progress Notes (Signed)
  [place] OFFICE PROGRESS NOTE   Diagnosis:  Rectal cancer.  INTERVAL HISTORY:   Jason [name] returns as scheduled. He continues weekly 5-FU. He denies nausea/vomiting. No mouth sores. He had some loose stools yesterday. No significant diarrhea. He denies hand or foot pain or redness. He continues to note skin darkening. Leg swelling continues to be improved. He has a good appetite.  Objective:  Vital signs in last 24 hours:  Blood pressure 161/88, pulse 69, temperature 97.5 F (36.4 C), temperature source Oral, resp. rate 18, height 5\' 7"  (1.702 m), weight 198 lb 8 oz (90.039 kg), SpO2 100.00%.    HEENT: No thrush or ulcerations. Resp: Lungs clear. Cardio: Regular cardiac rhythm. GI: Abdomen soft and nontender. No mass. No organomegaly. Vascular: Trace lower leg edema bilaterally.  Skin: Palms without erythema. Hyperpigmentation over the hands and forearms.   Lab Results:  Lab Results  Component Value Date   WBC 4.8 12/19/2013   HGB 10.3* 12/19/2013   HCT 31.0* 12/19/2013   MCV 93.7 12/19/2013   PLT 181 12/19/2013   NEUTROABS 3.4 12/19/2013    Imaging:  No results found.  Medications: I have reviewed the patient's current medications.  Assessment/Plan: 1. Rectal cancer, clinical stage III (uT3, uN2).  Initiation of concurrent Xeloda and radiation on 02/21/2013, completed 03/31/2013.  CEA normal 04/25/2013  Status post laparoscopic low anterior resection with diverting loop ileostomy 05/23/2013. Final pathology showed a 3.2 cm invasive adenocarcinoma with neoadjuvant related change. Tumor invaded into perirectal soft tissue. There was no lymphovascular or perineural invasion. 14 lymph nodes were negative for tumor. Surgical margins were negative. There was one hyperplastic polyp (ypT3, pN0).  Initiation of weekly 5-FU/leucovorin 08/01/2013  Ileostomy takedown 08/24/2013.  Weekly 5-FU/leucovorin resumed 09/22/2013. 2. Markedly elevated pretreatment CEA,  normal 07/21/2013, 09/22/2013. 3. Indeterminate pulmonary/subpleural nodules and right hepatic dome lesion. Unchanged on the CT 04/25/2013 4. Renal insufficiency. Acute on chronic renal failure during hospitalization November/December 2014. Improved following the ileostomy takedown. 5. Anemia secondary chemotherapy, chronic disease, and renal insufficiency 6. Hypertension. 7. Gout. 8. Prolonged postoperative ileus following laparoscopic low anterior resection with diverting loop ileostomy 05/23/2013. 9. C. difficile-positive 09/08/2013-status post Flagyl with improvement. Recurrent diarrhea following completion of Flagyl. Stool positive for C. difficile on 09/24/2013. He completed treatment with vancomycin. The diarrhea resolved. 10. Left greater than right low leg edema. Venous Doppler 09/26/2013 negative for left leg DVT.   Disposition: He appears well. Plan to proceed with 5-FU/leucovorin today as scheduled. This will complete the course of adjuvant therapy. Dr. Benay Spice recommends restaging CT scans in August 2015. Mr. Werth will return for a followup visit approximately 1 week after the scans to review the results. He will contact the office in the interim with any problems.  Plan reviewed with Dr. Benay Spice.    Ned Card ANP/GNP-BC   12/19/2013  9:28 AM

## 2014-01-09 ENCOUNTER — Encounter (INDEPENDENT_AMBULATORY_CARE_PROVIDER_SITE_OTHER): Payer: Self-pay | Admitting: General Surgery

## 2014-01-09 ENCOUNTER — Ambulatory Visit (INDEPENDENT_AMBULATORY_CARE_PROVIDER_SITE_OTHER): Payer: Medicare Other | Admitting: General Surgery

## 2014-01-09 VITALS — BP 122/80 | HR 76 | Temp 97.0°F | Ht 69.0 in | Wt 201.2 lb

## 2014-01-09 DIAGNOSIS — L02219 Cutaneous abscess of trunk, unspecified: Secondary | ICD-10-CM

## 2014-01-09 DIAGNOSIS — L03319 Cellulitis of trunk, unspecified: Principal | ICD-10-CM

## 2014-01-09 MED ORDER — AMOXICILLIN-POT CLAVULANATE 875-125 MG PO TABS: 1.0000 | ORAL_TABLET | Freq: Two times a day (BID) | ORAL | Status: DC

## 2014-01-09 NOTE — Progress Notes (Signed)
Jason [name] is a 68 y.o. male who is status post a loop ileostomy closeure on 2/18. He reports relatively good control but does have some leakage occasionally. He denies any diarrhea.  He did have an episode of C diff a few months ago. His renal function appears to be stable. He is finishing chemotherapy infusions with Dr. Benay Spice for adjuvant therapy after surgery.  He is scheduled for restaging CT's in Aug.  He is having 2-3 BM's a day. Objective:  Filed Vitals:   01/09/14 1653  BP: 122/80  Pulse: 76  Temp: 97 F (36.1 C)    General appearance: alert and cooperative  GI: normal findings: soft, non-tender  ostomy site healing well.  Incision: healing well, no masses, no stricture, small amt of stool leakage Assessment:  s/p  Patient Active Problem List    Diagnosis  Date Noted   .  Rectal cancer  02/01/2013   .  Anemia  07/20/2012   .  Chronic renal insufficiency  07/31/2011   .  EDEMA  09/21/2010   .  OBSTRUCTIVE SLEEP APNEA  07/29/2010   .  GOUT  05/11/2007   .  ERECTILE DYSFUNCTION, MILD  05/11/2007   .  HYPERTENSION  05/11/2007   .  COLONIC POLYPS, HX OF  05/11/2007   Plan:  Jason [name] Is a 68 year old male who is status post Loop ileostomy reversal after a coloanal anastomosis for rectal cancer. He has finished adjuvant chemotherapy with Dr. Benay Spice. I will see him back in 3 months for his cancer followup. We will do a digital rectal exam at that time to check his anastomosis for any recurrence.  He should have a colonoscopy this fall as well.

## 2014-01-12 ENCOUNTER — Encounter: Payer: Self-pay | Admitting: Internal Medicine

## 2014-02-13 ENCOUNTER — Encounter: Payer: Self-pay | Admitting: Family Medicine

## 2014-02-13 ENCOUNTER — Ambulatory Visit (INDEPENDENT_AMBULATORY_CARE_PROVIDER_SITE_OTHER): Payer: Medicare Other | Admitting: Family Medicine

## 2014-02-13 VITALS — BP 130/90 | Temp 99.3°F | Wt 207.0 lb

## 2014-02-13 DIAGNOSIS — R609 Edema, unspecified: Secondary | ICD-10-CM

## 2014-02-13 DIAGNOSIS — I10 Essential (primary) hypertension: Secondary | ICD-10-CM

## 2014-02-13 MED ORDER — HYDROCHLOROTHIAZIDE 12.5 MG PO TABS: ORAL_TABLET | ORAL | Status: DC

## 2014-02-13 NOTE — Patient Instructions (Signed)
Complete no salt diet  Hydrochlorothiazide 12.5 mg..............Marland Kitchen 1 daily every day

## 2014-02-13 NOTE — Progress Notes (Signed)
   Subjective:    Patient ID: Jason [name], male    DOB: 12-13-1945, 68 y.o.   MRN: [id number]  Jason [name] is a 68 year old married male nonsmoker who comes in today for evaluation of swelling of both legs  A central with venous insufficiency he's been on hydrochlorothiazide 12.5 mg Monday Wednesday Friday. Last Friday he noticed some swelling that hasn't gone away. No history of trauma no chest pain shortness of breath etc.    Review of Systems Review of systems negative    Objective:   Physical Exam  Well-developed well nourished male no acute distress vital signs stable he is afebrile examination of both lower extremities shows 1+ edema      Assessment & Plan:  Peripheral edema secondary to venous insufficiency.......Marland Kitchen

## 2014-02-20 ENCOUNTER — Ambulatory Visit (HOSPITAL_COMMUNITY)
Admission: RE | Admit: 2014-02-20 | Discharge: 2014-02-20 | Disposition: A | Discharge status: Home / Self Care | Payer: Medicare Other | Source: Ambulatory Visit | Attending: Nurse Practitioner | Admitting: Nurse Practitioner

## 2014-02-20 ENCOUNTER — Other Ambulatory Visit (HOSPITAL_BASED_OUTPATIENT_CLINIC_OR_DEPARTMENT_OTHER): Payer: Medicare Other

## 2014-02-20 DIAGNOSIS — C20 Malignant neoplasm of rectum: Secondary | ICD-10-CM | POA: Insufficient documentation

## 2014-02-20 LAB — COMPREHENSIVE METABOLIC PANEL (CC13)
ALT: 12 U/L (ref 0–55)
AST: 22 U/L (ref 5–34)
Albumin: 3.8 g/dL (ref 3.5–5.0)
Alkaline Phosphatase: 86 U/L (ref 40–150)
Anion Gap: 10 meq/L (ref 3–11)
BUN: 36 mg/dL — ABNORMAL HIGH (ref 7.0–26.0)
CO2: 30 meq/L — ABNORMAL HIGH (ref 22–29)
Calcium: 10.4 mg/dL (ref 8.4–10.4)
Chloride: 98 meq/L (ref 98–109)
Creatinine: 2.4 mg/dL — ABNORMAL HIGH (ref 0.7–1.3)
Glucose: 93 mg/dL (ref 70–140)
Potassium: 3.5 meq/L (ref 3.5–5.1)
Sodium: 138 meq/L (ref 136–145)
Total Bilirubin: 0.32 mg/dL (ref 0.20–1.20)
Total Protein: 7.2 g/dL (ref 6.4–8.3)

## 2014-02-20 LAB — CBC WITH DIFFERENTIAL/PLATELET
BASO%: 0.4 % (ref 0.0–2.0)
Basophils Absolute: 0 10e3/uL (ref 0.0–0.1)
EOS%: 1.4 % (ref 0.0–7.0)
Eosinophils Absolute: 0.1 10e3/uL (ref 0.0–0.5)
HCT: 36.6 % — ABNORMAL LOW (ref 38.4–49.9)
HGB: 11.6 g/dL — ABNORMAL LOW (ref 13.0–17.1)
LYMPH%: 9.8 % — ABNORMAL LOW (ref 14.0–49.0)
MCH: 30 pg (ref 27.2–33.4)
MCHC: 31.8 g/dL — ABNORMAL LOW (ref 32.0–36.0)
MCV: 94.2 fL (ref 79.3–98.0)
MONO#: 1 10e3/uL — ABNORMAL HIGH (ref 0.1–0.9)
MONO%: 18.5 % — ABNORMAL HIGH (ref 0.0–14.0)
NEUT#: 3.9 10e3/uL (ref 1.5–6.5)
NEUT%: 69.9 % (ref 39.0–75.0)
Platelets: 190 10e3/uL (ref 140–400)
RBC: 3.88 10e6/uL — ABNORMAL LOW (ref 4.20–5.82)
RDW: 15.4 % — ABNORMAL HIGH (ref 11.0–14.6)
WBC: 5.6 10e3/uL (ref 4.0–10.3)
lymph#: 0.5 10e3/uL — ABNORMAL LOW (ref 0.9–3.3)

## 2014-02-21 LAB — CEA: CEA: 0.7 ng/mL (ref 0.0–5.0)

## 2014-02-23 ENCOUNTER — Telehealth: Payer: Self-pay | Admitting: *Deleted

## 2014-02-23 NOTE — Telephone Encounter (Signed)
Called patient with CT results and faxed labs to nephrologist Dr. Elmarie Shiley [phone].  Per Dr. Benay Spice.  Patient verbalized understanding.

## 2014-02-23 NOTE — Telephone Encounter (Signed)
Message copied by Wardell Heath on Thu Feb 23, 2014  8:59 AM ------      Message from: Betsy Coder B      Created: Wed Feb 22, 2014  8:12 PM       Please call patient, CTs are negative, copy chem. Panel to nephrologist ------

## 2014-02-27 ENCOUNTER — Ambulatory Visit (HOSPITAL_BASED_OUTPATIENT_CLINIC_OR_DEPARTMENT_OTHER): Payer: Medicare Other | Admitting: Oncology

## 2014-02-27 VITALS — BP 125/74 | HR 80 | Temp 98.1°F | Resp 20 | Ht 69.0 in | Wt 207.2 lb

## 2014-02-27 DIAGNOSIS — T451X5A Adverse effect of antineoplastic and immunosuppressive drugs, initial encounter: Secondary | ICD-10-CM

## 2014-02-27 DIAGNOSIS — D638 Anemia in other chronic diseases classified elsewhere: Secondary | ICD-10-CM

## 2014-02-27 DIAGNOSIS — N289 Disorder of kidney and ureter, unspecified: Secondary | ICD-10-CM

## 2014-02-27 DIAGNOSIS — R911 Solitary pulmonary nodule: Secondary | ICD-10-CM

## 2014-02-27 DIAGNOSIS — I10 Essential (primary) hypertension: Secondary | ICD-10-CM

## 2014-02-27 DIAGNOSIS — R159 Full incontinence of feces: Secondary | ICD-10-CM

## 2014-02-27 DIAGNOSIS — D6481 Anemia due to antineoplastic chemotherapy: Secondary | ICD-10-CM

## 2014-02-27 DIAGNOSIS — C20 Malignant neoplasm of rectum: Secondary | ICD-10-CM

## 2014-02-27 NOTE — Progress Notes (Signed)
  [place] OFFICE PROGRESS NOTE   Diagnosis: Rectal cancer  INTERVAL HISTORY:   He returns as scheduled. He feels well. He continues to have several bowel movements per day and episodes of fecal incontinence. He had bilateral foot swelling recently and saw Dr. Sherren Mocha. The diuretic dose was increased.  Objective:  Vital signs in last 24 hours:  Blood pressure 125/74, pulse 80, temperature 98.1 F (36.7 C), temperature source Oral, resp. rate 20, height 5\' 9"  (1.753 m), weight 207 lb 3.2 oz (93.985 kg), SpO2 99.00%.    HEENT: Neck without mass Lymphatics: No cervical, supra-clavicular, axillary, or inguinal nodes Resp: Lungs clear bilaterally Cardio: Regular rate and rhythm GI: No hepatomegaly, nontender, no mass Vascular: Trace, left greater than right lobe pretibial edema   Lab Results:  Lab Results  Component Value Date   WBC 5.6 02/20/2014   HGB 11.6* 02/20/2014   HCT 36.6* 02/20/2014   MCV 94.2 02/20/2014   PLT 190 02/20/2014   NEUTROABS 3.9 02/20/2014   potassium 3.5, BUN 36, creatinine 2.4  Lab Results  Component Value Date   CEA 0.7 02/20/2014    Imaging: CT chest, abdomen, and pelvis on 02/20/2014: Small lung nodules are unchanged. No evidence of recurrent/metastatic disease.  Medications: I have reviewed the patient's current medications.  Assessment/Plan: 1. Rectal cancer, clinical stage III (uT3, uN2).  Initiation of concurrent Xeloda and radiation on 02/21/2013, completed 03/31/2013.  CEA normal 04/25/2013  Status post laparoscopic low anterior resection with diverting loop ileostomy 05/23/2013. Final pathology showed a 3.2 cm invasive adenocarcinoma with neoadjuvant related change. Tumor invaded into perirectal soft tissue. There was no lymphovascular or perineural invasion. 14 lymph nodes were negative for tumor. Surgical margins were negative. There was one hyperplastic polyp (ypT3, pN0).  Initiation of weekly 5-FU/leucovorin 08/01/2013    Ileostomy takedown 08/24/2013.  Weekly 5-FU/leucovorin resumed 09/22/2013. Restaging CTs 02/20/2014 negative for evidence of recurrent disease 2. Markedly elevated pretreatment CEA, normal 07/21/2013, 09/22/2013, 02/20/2014 3. Indeterminate pulmonary/subpleural nodules and right hepatic dome lesion. Unchanged on the CT 04/25/2013, unchanged nodular lung lesions on the CT 02/20/2014 4. Renal insufficiency. Acute on chronic renal failure during hospitalization November/December 2014. Improved following the ileostomy takedown. 5. Anemia secondary chemotherapy, chronic disease, and renal insufficiency 6. Hypertension. 7. Gout. 8. Prolonged postoperative ileus following laparoscopic low anterior resection with diverting loop ileostomy 05/23/2013. 9. C. difficile-positive 09/08/2013-status post Flagyl with improvement. Recurrent diarrhea following completion of Flagyl. Stool positive for C. difficile on 09/24/2013. He completed treatment with vancomycin. The diarrhea resolved. 10. Left greater than right low leg edema. Venous Doppler 09/26/2013 negative for left leg DVT. 11. Rectal urgency/incontinence-we will make a referral to the pelvic physical therapy clinic   Disposition:   Jason [name] remains in clinical remission from rectal cancer. He will return for an office visit and CEA in 3 months. The elevated creatinine on the 02/20/2014 chemistry panel may be related to the increased diuretic dose. We will alert Dr. Sherren Mocha of this change.  Betsy Coder, MD  02/27/2014  3:47 PM

## 2014-02-28 ENCOUNTER — Other Ambulatory Visit: Payer: Self-pay | Admitting: *Deleted

## 2014-02-28 ENCOUNTER — Telehealth: Payer: Self-pay | Admitting: Oncology

## 2014-03-07 ENCOUNTER — Ambulatory Visit: Payer: Medicare Other | Attending: Oncology | Admitting: Physical Therapy

## 2014-03-07 DIAGNOSIS — Z9049 Acquired absence of other specified parts of digestive tract: Secondary | ICD-10-CM | POA: Diagnosis not present

## 2014-03-07 DIAGNOSIS — I10 Essential (primary) hypertension: Secondary | ICD-10-CM | POA: Insufficient documentation

## 2014-03-07 DIAGNOSIS — M2420 Disorder of ligament, unspecified site: Secondary | ICD-10-CM | POA: Insufficient documentation

## 2014-03-07 DIAGNOSIS — M629 Disorder of muscle, unspecified: Secondary | ICD-10-CM | POA: Diagnosis not present

## 2014-03-07 DIAGNOSIS — IMO0001 Reserved for inherently not codable concepts without codable children: Secondary | ICD-10-CM | POA: Insufficient documentation

## 2014-03-07 DIAGNOSIS — Z85048 Personal history of other malignant neoplasm of rectum, rectosigmoid junction, and anus: Secondary | ICD-10-CM | POA: Insufficient documentation

## 2014-03-15 ENCOUNTER — Telehealth: Payer: Self-pay

## 2014-03-15 NOTE — Telephone Encounter (Signed)
Last report reads FAIR but adequate prep with Miralax.  Is MOVI OK or would you like 2-day prep?  Thank you, Levada Dy pre-visit

## 2014-03-15 NOTE — Telephone Encounter (Signed)
He can have a regular Movi prep,

## 2014-03-16 NOTE — Telephone Encounter (Signed)
Entered regular moviprep per dr Olevia Perches. ewm

## 2014-03-17 ENCOUNTER — Ambulatory Visit (AMBULATORY_SURGERY_CENTER): Payer: Self-pay

## 2014-03-17 VITALS — Ht 68.0 in | Wt 209.0 lb

## 2014-03-17 DIAGNOSIS — Z85048 Personal history of other malignant neoplasm of rectum, rectosigmoid junction, and anus: Secondary | ICD-10-CM

## 2014-03-17 MED ORDER — MOVIPREP 100 G PO SOLR: 1.0000 | Freq: Once | ORAL | Status: DC

## 2014-03-17 NOTE — Progress Notes (Signed)
No allergies to eggs or soy No home oxygen No past problems with anesthesia No diet/weight loss meds  No email 

## 2014-03-19 ENCOUNTER — Other Ambulatory Visit: Payer: Self-pay | Admitting: Oncology

## 2014-03-20 ENCOUNTER — Ambulatory Visit: Payer: Medicare Other | Admitting: Physical Therapy

## 2014-03-20 DIAGNOSIS — IMO0001 Reserved for inherently not codable concepts without codable children: Secondary | ICD-10-CM | POA: Diagnosis not present

## 2014-03-30 ENCOUNTER — Telehealth: Payer: Self-pay

## 2014-03-30 NOTE — Telephone Encounter (Signed)
Patient called to office. He is concerned about diarrhea. He is prepping for his colonoscopy tomorrow. Explained to him diarrhea is expected, stool should become clear as the prep progresses and to drink plenty of the approved fluids to assist with this process.

## 2014-03-31 ENCOUNTER — Ambulatory Visit (AMBULATORY_SURGERY_CENTER): Payer: Medicare Other | Admitting: Internal Medicine

## 2014-03-31 ENCOUNTER — Encounter: Payer: Self-pay | Admitting: Internal Medicine

## 2014-03-31 VITALS — BP 146/91 | HR 83 | Temp 98.5°F | Resp 13 | Ht 68.0 in | Wt 209.0 lb

## 2014-03-31 DIAGNOSIS — D126 Benign neoplasm of colon, unspecified: Secondary | ICD-10-CM

## 2014-03-31 DIAGNOSIS — D122 Benign neoplasm of ascending colon: Secondary | ICD-10-CM

## 2014-03-31 DIAGNOSIS — C20 Malignant neoplasm of rectum: Secondary | ICD-10-CM

## 2014-03-31 DIAGNOSIS — Z85048 Personal history of other malignant neoplasm of rectum, rectosigmoid junction, and anus: Secondary | ICD-10-CM

## 2014-03-31 DIAGNOSIS — R198 Other specified symptoms and signs involving the digestive system and abdomen: Secondary | ICD-10-CM

## 2014-03-31 LAB — HM COLONOSCOPY

## 2014-03-31 MED ORDER — SODIUM CHLORIDE 0.9 % IV SOLN: 500.0000 mL | INTRAVENOUS | Status: DC

## 2014-03-31 NOTE — Progress Notes (Signed)
Called to room to assist during endoscopic procedure.  Patient ID and intended procedure confirmed with present staff. Received instructions for my participation in the procedure from the performing physician.  

## 2014-03-31 NOTE — Progress Notes (Signed)
Report to PACU, RN, vss, BBS= Clear.  

## 2014-03-31 NOTE — Patient Instructions (Addendum)
YOU HAD AN ENDOSCOPIC PROCEDURE TODAY AT [facility]: Refer to the procedure report that was given to you for any specific questions about what was found during the examination.  If the procedure report does not answer your questions, please call your gastroenterologist to clarify.  If you requested that your care partner not be given the details of your procedure findings, then the procedure report has been included in a sealed envelope for you to review at your convenience later.  YOU SHOULD EXPECT: Some feelings of bloating in the abdomen. Passage of more gas than usual.  Walking can help get rid of the air that was put into your GI tract during the procedure and reduce the bloating. If you had a lower endoscopy (such as a colonoscopy or flexible sigmoidoscopy) you may notice spotting of blood in your stool or on the toilet paper. If you underwent a bowel prep for your procedure, then you may not have a normal bowel movement for a few days.  DIET: Your first meal following the procedure should be a light meal and then it is ok to progress to your normal diet.  A half-sandwich or bowl of soup is an example of a good first meal.  Heavy or fried foods are harder to digest and may make you feel nauseous or bloated.  Likewise meals heavy in dairy and vegetables can cause extra gas to form and this can also increase the bloating.  Drink plenty of fluids but you should avoid alcoholic beverages for 24 hours. Try to increase the fiber in your diet.  ACTIVITY: Your care partner should take you home directly after the procedure.  You should plan to take it easy, moving slowly for the rest of the day.  You can resume normal activity the day after the procedure however you should NOT DRIVE or use heavy machinery for 24 hours (because of the sedation medicines used during the test).    SYMPTOMS TO REPORT IMMEDIATELY: A gastroenterologist can be reached at any hour.  During normal business hours, 8:30  AM to 5:00 PM Monday through Friday, call [phone].  After hours and on weekends, please call the GI answering service at [phone] who will take a message and have the physician on call contact you.   Following lower endoscopy (colonoscopy or flexible sigmoidoscopy):  Excessive amounts of blood in the stool  Significant tenderness or worsening of abdominal pains  Swelling of the abdomen that is new, acute  Fever of 100F or higher  FOLLOW UP: If any biopsies were taken you will be contacted by phone or by letter within the next 1-3 weeks.  Call your gastroenterologist if you have not heard about the biopsies in 3 weeks.  Our staff will call the home number listed on your records the next business day following your procedure to check on you and address any questions or concerns that you may have at that time regarding the information given to you following your procedure. This is a courtesy call and so if there is no answer at the home number and we have not heard from you through the emergency physician on call, we will assume that you have returned to your regular daily activities without incident.  SIGNATURES/CONFIDENTIALITY: You and/or your care partner have signed paperwork which will be entered into your electronic medical record.  These signatures attest to the fact that that the information above on your After Visit Summary has been reviewed and is understood.  Full responsibility of the confidentiality of this discharge information lies with you and/or your care-partner.  Please, read the handouts on polyps and diverticulosis given to you by your recovery room nurse.

## 2014-03-31 NOTE — Op Note (Signed)
[place]  [address]. [place], [address]   COLONOSCOPY PROCEDURE REPORT  PATIENT: Jason [name]  MR#: [id number] BIRTHDATE: 08-May-1946 , 67  yrs. old GENDER: male ENDOSCOPIST: Lafayette Dragon, MD REFERRED UR:[facility] Edrick Kins, M.D. , Dr A.Marcello Moores,  Dr Lenna Sciara.Todd PROCEDURE DATE:  03/31/2014 PROCEDURE:   Colonoscopy with snare polypectomy and Colonoscopy with biopsy First Screening Colonoscopy - Avg.  risk and is 50 yrs.  old or older - No.  Prior Negative Screening - Now for repeat screening. N/A  History of Adenoma - Now for follow-up colonoscopy & has been > or = to 3 yrs.  N/A  Polyps Removed Today? Yes. ASA CLASS:   Class III INDICATIONS: adenocarcinoma carcinoma of the rectum diagnosed  in July 2014.  Cancer  stage III.  Patient underwent chemotherapy radiation, low anterior resection in November 2014.  Ileostomy takedown in February 2015.  doing well. MEDICATIONS: Monitored anesthesia care and Propofol 200 mg IV  DESCRIPTION OF PROCEDURE:   After the risks benefits and alternatives of the procedure were thoroughly explained, informed consent was obtained.  The digital rectal exam revealed decreased sphincter tone.   The LB [id number]  endoscope was introduced through the anus and advanced to the cecum, which was identified by both the appendix and ileocecal valve. No adverse events experienced.   The quality of the prep was good, using MoviPrep The instrument was then slowly withdrawn as the colon was fully examined.      COLON FINDINGS: One polypoid shaped pedunculated polyps measuring 9 mm in size weas found in the ascending colon.  A polypectomy was performed with a cold snare.  The resection was complete, the polyp tissue was completely retrieved and sent to histology.   A polypoid shaped polyp vs edematous fold  measuring 9 mm in size was found in the rectum. it was soft and located at the colorectal anastomosis. A biopsy was performed using  cold forceps.   There was evidence of a prior colorectal surgical anastomosis.at 5 cm.there was no evidence of recurrent cancer. Anastomosis was widely open the mucosa distal from the anastomosis appeared edematous and erythematous suggesting radiation proctitis There was mild diverticulosis noted throughout the entire examined colon. Multiple small punctate erosions were found.  Multiple biopsies were performed. Retroflexion  could not be accomplished due to the small size of the rectal ampulla  Retroflexion was not performed. The time to cecum=6 minutes 21 seconds.  Withdrawal time=7 minutes 09 seconds.  The scope was withdrawn and the procedure completed. COMPLICATIONS: There were no complications.  ENDOSCOPIC IMPRESSION: 1.   One  pedunculated polyp  was  found in the ascending colon; polypectomy was performed with a cold snare 2.   Polyp vs edematous fold  was found in the rectum; biopsy was performed using cold forceps 3.   There was evidence of a prior colorectal surgical anastomosis at 5 cm, no evidence of recurrent cancer 4.   Mild diverticulosis was noted throughout the entire examined colon 5.   Multiple small erosions were found in the rectal ampulla multiple biopsies were performed to r/o raiation proctitid 6.   rectal sphincter tone was decreased  RECOMMENDATIONS: 1.  Await biopsy results 2.  High fiber diet Recall colonoscopy in 2 years  eSigned:  Lafayette Dragon, MD 03/31/2014 9:55 AM   cc:   PATIENT NAME:  Jason [name], Jason [name] MR#: [id number]

## 2014-04-03 ENCOUNTER — Telehealth: Payer: Self-pay | Admitting: *Deleted

## 2014-04-03 NOTE — Telephone Encounter (Signed)
  Follow up Call-  Call back number 03/31/2014 01/21/2013  Post procedure Call Back phone  # [phone]  Permission to leave phone message Yes Yes     Patient questions:  Do you have a fever, pain , or abdominal swelling? No. Pain Score  0 *  Have you tolerated food without any problems? Yes.    Have you been able to return to your normal activities? Yes.    Do you have any questions about your discharge instructions: Diet   No. Medications  No. Follow up visit  No.  Do you have questions or concerns about your Care? No.  Actions: * If pain score is 4 or above: No action needed, pain <4.

## 2014-04-04 ENCOUNTER — Encounter: Payer: Self-pay | Admitting: Internal Medicine

## 2014-04-10 ENCOUNTER — Ambulatory Visit: Payer: Medicare Other | Attending: Oncology | Admitting: Physical Therapy

## 2014-04-10 ENCOUNTER — Ambulatory Visit (INDEPENDENT_AMBULATORY_CARE_PROVIDER_SITE_OTHER): Payer: Medicare Other | Admitting: *Deleted

## 2014-04-10 DIAGNOSIS — Z9049 Acquired absence of other specified parts of digestive tract: Secondary | ICD-10-CM | POA: Diagnosis not present

## 2014-04-10 DIAGNOSIS — I10 Essential (primary) hypertension: Secondary | ICD-10-CM | POA: Diagnosis not present

## 2014-04-10 DIAGNOSIS — Z5189 Encounter for other specified aftercare: Secondary | ICD-10-CM | POA: Diagnosis not present

## 2014-04-10 DIAGNOSIS — M629 Disorder of muscle, unspecified: Secondary | ICD-10-CM | POA: Insufficient documentation

## 2014-04-10 DIAGNOSIS — Z23 Encounter for immunization: Secondary | ICD-10-CM

## 2014-04-10 DIAGNOSIS — Z85048 Personal history of other malignant neoplasm of rectum, rectosigmoid junction, and anus: Secondary | ICD-10-CM | POA: Diagnosis not present

## 2014-04-25 ENCOUNTER — Ambulatory Visit: Payer: Medicare Other | Admitting: Physical Therapy

## 2014-04-25 DIAGNOSIS — Z5189 Encounter for other specified aftercare: Secondary | ICD-10-CM | POA: Diagnosis not present

## 2014-05-08 ENCOUNTER — Ambulatory Visit: Payer: Medicare Other | Admitting: Physical Therapy

## 2014-05-10 ENCOUNTER — Telehealth: Payer: Self-pay | Admitting: *Deleted

## 2014-05-10 NOTE — Telephone Encounter (Signed)
Patient left VM asking when is his next appointment? Called back and gave him 05/29/14 at 2:45/3:15 for appointment.

## 2014-05-22 ENCOUNTER — Encounter: Payer: Medicare Other | Admitting: Physical Therapy

## 2014-05-29 ENCOUNTER — Other Ambulatory Visit (HOSPITAL_BASED_OUTPATIENT_CLINIC_OR_DEPARTMENT_OTHER): Payer: Medicare Other

## 2014-05-29 ENCOUNTER — Ambulatory Visit (HOSPITAL_BASED_OUTPATIENT_CLINIC_OR_DEPARTMENT_OTHER): Payer: Medicare Other | Admitting: Nurse Practitioner

## 2014-05-29 VITALS — BP 168/85 | HR 71 | Temp 98.5°F | Resp 18 | Ht 68.0 in | Wt 219.3 lb

## 2014-05-29 DIAGNOSIS — R911 Solitary pulmonary nodule: Secondary | ICD-10-CM

## 2014-05-29 DIAGNOSIS — N289 Disorder of kidney and ureter, unspecified: Secondary | ICD-10-CM

## 2014-05-29 DIAGNOSIS — D649 Anemia, unspecified: Secondary | ICD-10-CM

## 2014-05-29 DIAGNOSIS — I10 Essential (primary) hypertension: Secondary | ICD-10-CM

## 2014-05-29 DIAGNOSIS — D6481 Anemia due to antineoplastic chemotherapy: Secondary | ICD-10-CM

## 2014-05-29 DIAGNOSIS — K769 Liver disease, unspecified: Secondary | ICD-10-CM

## 2014-05-29 DIAGNOSIS — D638 Anemia in other chronic diseases classified elsewhere: Secondary | ICD-10-CM

## 2014-05-29 DIAGNOSIS — C20 Malignant neoplasm of rectum: Secondary | ICD-10-CM

## 2014-05-29 NOTE — Progress Notes (Signed)
  [facility] OFFICE PROGRESS NOTE   Diagnosis:  Rectal cancer  INTERVAL HISTORY:   Jason [name] returns as scheduled. He feels well. He has a good appetite. He has noted less frequent bowel movements and no further problems with incontinence since participating in the pelvic physical therapy program. He has occasional loose stools. He denies abdominal pain.  Objective:  Vital signs in last 24 hours:  Blood pressure 168/85, pulse 71, temperature 98.5 F (36.9 C), temperature source Oral, resp. rate 18, height 5\' 8"  (1.727 m), weight 219 lb 4.8 oz (99.474 kg).    HEENT: no thrush or ulcers. Lymphatics: no palpable cervical, supraclavicular, axillary or inguinal lymph nodes. Resp: lungs clear bilaterally. Cardio: regular rate and rhythm. GI: abdomen soft and nontender. No mass. No organomegaly. Vascular: no leg edema.   Lab Results:  Lab Results  Component Value Date   WBC 5.6 02/20/2014   HGB 11.6* 02/20/2014   HCT 36.6* 02/20/2014   MCV 94.2 02/20/2014   PLT 190 02/20/2014   NEUTROABS 3.9 02/20/2014   CEA pending  Imaging:  No results found.  Medications: I have reviewed the patient's current medications.  Assessment/Plan: 1. Rectal cancer, clinical stage III (uT3, uN2).  Initiation of concurrent Xeloda and radiation on 02/21/2013, completed 03/31/2013.   CEA normal 04/25/2013   Status post laparoscopic low anterior resection with diverting loop ileostomy 05/23/2013. Final pathology showed a 3.2 cm invasive adenocarcinoma with neoadjuvant related change. Tumor invaded into perirectal soft tissue. There was no lymphovascular or perineural invasion. 14 lymph nodes were negative for tumor. Surgical margins were negative. There was one hyperplastic polyp (ypT3, pN0).   Initiation of weekly 5-FU/leucovorin 08/01/2013   Ileostomy takedown 08/24/2013.   Weekly 5-FU/leucovorin resumed 09/22/2013.  Restaging CTs 02/20/2014 negative for evidence of  recurrent disease 2. Markedly elevated pretreatment CEA, normal 07/21/2013, 09/22/2013, 02/20/2014 3. Indeterminate pulmonary/subpleural nodules and right hepatic dome lesion. Unchanged on the CT 04/25/2013, unchanged nodular lung lesions on the CT 02/20/2014 4. Renal insufficiency. Acute on chronic renal failure during hospitalization November/December 2014. Improved following the ileostomy takedown. 5. Anemia secondary chemotherapy, chronic disease, and renal insufficiency 6. Hypertension. 7. Gout. 8. Prolonged postoperative ileus following laparoscopic low anterior resection with diverting loop ileostomy 05/23/2013. 9. C. difficile-positive 09/08/2013-status post Flagyl with improvement. Recurrent diarrhea following completion of Flagyl. Stool positive for C. difficile on 09/24/2013. He completed treatment with vancomycin. The diarrhea resolved. 10. Left greater than right low leg edema. Venous Doppler 09/26/2013 negative for left leg DVT. 11. Rectal urgency/incontinence-Improved with participation in the pelvic physical therapy clinic.   Disposition: Jason [name] appears stable. He remains in clinical remission from rectal cancer. We will followup on the CEA from today. He will return for followup visit and CEA in 3 months. He will contact the office in the interim with any problems.    Ned Card ANP/GNP-BC   05/29/2014  3:56 PM

## 2014-05-30 ENCOUNTER — Telehealth: Payer: Self-pay | Admitting: *Deleted

## 2014-05-30 LAB — CEA: CEA: 1.1 ng/mL (ref 0.0–5.0)

## 2014-05-30 NOTE — Telephone Encounter (Signed)
Called and informed patient that cea is normal.  Per Dr. Benay Spice.  Patient verbalized understanding.

## 2014-05-30 NOTE — Telephone Encounter (Signed)
-----   Message from [name], MD sent at 05/30/2014  9:46 AM EST ----- Please call patient, cea is normal

## 2014-05-31 ENCOUNTER — Telehealth: Payer: Self-pay | Admitting: Oncology

## 2014-05-31 NOTE — Telephone Encounter (Signed)
S/w pt confirming labs/ov per 11/23 POF, mailed sch per pt's request.... KJ

## 2014-06-05 ENCOUNTER — Encounter: Payer: Medicare Other | Admitting: Physical Therapy

## 2014-07-25 DIAGNOSIS — G5762 Lesion of plantar nerve, left lower limb: Secondary | ICD-10-CM | POA: Diagnosis not present

## 2014-07-25 DIAGNOSIS — G5761 Lesion of plantar nerve, right lower limb: Secondary | ICD-10-CM | POA: Diagnosis not present

## 2014-08-01 ENCOUNTER — Telehealth: Payer: Self-pay | Admitting: Family Medicine

## 2014-08-01 NOTE — Telephone Encounter (Signed)
Patient unable to get here tomorrow morning.  Appointment scheduled for afternoon with Dr Elease Hashimoto.  Wife is aware if patient complains of shortness of breath, chest pain etc he should go to the ER.

## 2014-08-01 NOTE — Telephone Encounter (Signed)
NP called to report elevated BP/ Readings have been 194/110, 191/114 both sitting. Patient needs addition direction and pt states he is taking it as directed. Is taking Cardura 8mg  and HCTZ 12.5 and Coreg 6.25.

## 2014-08-02 ENCOUNTER — Encounter: Payer: Self-pay | Admitting: Family Medicine

## 2014-08-02 ENCOUNTER — Ambulatory Visit (INDEPENDENT_AMBULATORY_CARE_PROVIDER_SITE_OTHER): Payer: Medicare Other | Admitting: Family Medicine

## 2014-08-02 VITALS — BP 140/90 | HR 86 | Temp 98.2°F | Wt 232.0 lb

## 2014-08-02 DIAGNOSIS — R600 Localized edema: Secondary | ICD-10-CM | POA: Diagnosis not present

## 2014-08-02 DIAGNOSIS — N189 Chronic kidney disease, unspecified: Secondary | ICD-10-CM | POA: Diagnosis not present

## 2014-08-02 DIAGNOSIS — I10 Essential (primary) hypertension: Secondary | ICD-10-CM | POA: Diagnosis not present

## 2014-08-02 LAB — BASIC METABOLIC PANEL
BUN: 39 mg/dL — AB (ref 6–23)
CHLORIDE: 101 meq/L (ref 96–112)
CO2: 31 mEq/L (ref 19–32)
Calcium: 10 mg/dL (ref 8.4–10.5)
Creatinine, Ser: 2.77 mg/dL — ABNORMAL HIGH (ref 0.40–1.50)
GFR: 29.51 mL/min — ABNORMAL LOW (ref >60.00)
GLUCOSE: 89 mg/dL (ref 70–99)
POTASSIUM: 4.2 meq/L (ref 3.5–5.1)
SODIUM: 138 meq/L (ref 135–145)

## 2014-08-02 MED ORDER — FUROSEMIDE 40 MG PO TABS: 40.0000 mg | ORAL_TABLET | Freq: Every day | ORAL | Status: DC

## 2014-08-02 NOTE — Progress Notes (Signed)
Subjective:    Patient ID: Jason [name], male    DOB: [birth year], 69 y.o.   MRN: [id number]  HPI Patient seen following "House Calls" from nurse practitioner with his insurance, yesterday. He had blood pressure 194/110 in one arm and 191/114 other arm. He has long-standing history of hypertension and also chronic kidney disease. He is followed by nephrology to but apparently his baseline creatinine from 1.9. He's had some chronic edema possibly worse in his legs over the past several weeks. No dyspnea. No orthopnea. His current blood pressure medications include Cardura, HCTZ, carvedilol 6.25 mg twice daily. There've been discussion per nephrology of stopping his HCTZ and change to loop diuretic because of his progressive chronic kidney disease. No recent dietary changes. Watch his sodium closely. No regular alcohol use.  Past Medical History  Diagnosis Date  . Gout   . Hypertension   . Hyperplastic colon polyp   . Diverticulosis   . Anemia   . Arthritis   . ED (erectile dysfunction)   . Chronic renal insufficiency, stage II (mild)   . History of radiation therapy 02/21/13-03/31/13    rectum 50.4Gy total dose  . rectal ca dx'd 02/01/13    rectal. Radiation and chemotherapy- remains on chemotherapy-next tx. 08-22-13.    Past Surgical History  Procedure Laterality Date  . Colonoscopy  2010    [facility]  . Eus N/A 02/03/2013    Procedure: LOWER ENDOSCOPIC ULTRASOUND (EUS);  Surgeon: Milus Banister, MD;  Location: [facility];  Service: Endoscopy;  Laterality: N/A;  . Laparoscopic low anterior resection N/A 05/20/2013    Procedure: LAPAROSCOPIC LOW ANTERIOR RESECTION WITH SPLENIC FLEXURE MOBILIZATION;  Surgeon: Leighton Ruff, MD;  Location: [facility];  Service: General;  Laterality: N/A;  . Ostomy N/A 05/20/2013    Procedure: diverting OSTOMY;  Surgeon: Leighton Ruff, MD;  Location: [facility];  Service: General;  Laterality: N/A;  . Colon surgery  05/20/2013  . Ileostomy closure N/A 08/24/2013      Procedure: CLOSURE OF LOOP ILEOSTOMY ;  Surgeon: Leighton Ruff, MD;  Location: [facility];  Service: General;  Laterality: N/A;    reports that he quit smoking about 37 years ago. He has never used smokeless tobacco. He reports that he does not drink alcohol or use illicit drugs. family history includes Hypertension in his other; Stroke in his father and mother. There is no history of Colon cancer. Allergies  Allergen Reactions  . Nsaids     Asked by surgeon to add this medication class as intolerance due to patients renal insufficiency.      Review of Systems  Constitutional: Negative for fatigue.  Eyes: Negative for visual disturbance.  Respiratory: Negative for cough, chest tightness and shortness of breath.   Cardiovascular: Positive for leg swelling. Negative for chest pain and palpitations.  Neurological: Negative for dizziness, syncope, weakness, light-headedness and headaches.       Objective:   Physical Exam  Constitutional: He is oriented to person, place, and time. He appears well-developed and well-nourished.  HENT:  Right Ear: External ear normal.  Left Ear: External ear normal.  Mouth/Throat: Oropharynx is clear and moist.  Eyes: Pupils are equal, round, and reactive to light.  Neck: Neck supple. No JVD present. No thyromegaly present.  Cardiovascular: Normal rate and regular rhythm.   Pulmonary/Chest: Effort normal and breath sounds normal. No respiratory distress. He has no wheezes. He has no rales.  Musculoskeletal: He exhibits edema.  He has trace pitting edema legs  bilaterally left slightly greater than right  Neurological: He is alert and oriented to person, place, and time.          Assessment & Plan:  Patient has chronic kidney disease with baseline creatinine reported around 1.9 with hypertension and some chronic lower extremity edema. We discussed discontinuing HCTZ with his chronic kidney disease as this is probably not very effective at this point.  We'll repeat basic metabolic panel. Discontinue HCTZ and start furosemide 40 mg once daily. Follow up with primary in 3 weeks to reassess BP and edema. VERY high BP yesterday as above but much better today on repeat readings-140/80.

## 2014-08-02 NOTE — Patient Instructions (Signed)
Stop the HCTZ and start Furosemide 40 mg once daily.

## 2014-08-02 NOTE — Progress Notes (Signed)
Pre visit review using our clinic review tool, if applicable. No additional management support is needed unless otherwise documented below in the visit note. 

## 2014-08-03 ENCOUNTER — Telehealth: Payer: Self-pay | Admitting: Family Medicine

## 2014-08-03 NOTE — Telephone Encounter (Signed)
Correct.  Only the HCTZ, though we do have to be careful with his CKD.

## 2014-08-03 NOTE — Telephone Encounter (Signed)
Pt said he saw Dr Elease Hashimoto on 08/02/14 and thinks Dr Elease Hashimoto told him to stop taking the following med KLOR-CON M20 20 MEQ tablet . He need someone to verify and is requesting a call back

## 2014-08-03 NOTE — Telephone Encounter (Signed)
I dont see that you told the patient to stop Klor-Con. Just only the HCTZ.

## 2014-08-04 NOTE — Telephone Encounter (Signed)
Pt informed

## 2014-08-17 ENCOUNTER — Encounter: Payer: Self-pay | Admitting: Family Medicine

## 2014-08-17 ENCOUNTER — Ambulatory Visit (INDEPENDENT_AMBULATORY_CARE_PROVIDER_SITE_OTHER): Payer: Medicare Other | Admitting: Family Medicine

## 2014-08-17 VITALS — BP 130/80 | Temp 98.4°F | Wt 234.0 lb

## 2014-08-17 DIAGNOSIS — N182 Chronic kidney disease, stage 2 (mild): Secondary | ICD-10-CM | POA: Diagnosis not present

## 2014-08-17 DIAGNOSIS — R609 Edema, unspecified: Secondary | ICD-10-CM | POA: Diagnosis not present

## 2014-08-17 LAB — BASIC METABOLIC PANEL
BUN: 62 mg/dL — ABNORMAL HIGH (ref 6–23)
CALCIUM: 10.6 mg/dL — AB (ref 8.4–10.5)
CO2: 31 mEq/L (ref 19–32)
CREATININE: 3.87 mg/dL — AB (ref 0.40–1.50)
Chloride: 95 mEq/L — ABNORMAL LOW (ref 96–112)
GFR: 20.06 mL/min — ABNORMAL LOW (ref >60.00)
Glucose, Bld: 96 mg/dL (ref 70–99)
Potassium: 4.2 mEq/L (ref 3.5–5.1)
Sodium: 135 mEq/L (ref 135–145)

## 2014-08-17 NOTE — Progress Notes (Signed)
Pre visit review using our clinic review tool, if applicable. No additional management support is needed unless otherwise documented below in the visit note. 

## 2014-08-17 NOTE — Progress Notes (Signed)
   Subjective:    Patient ID: Jason [name], male    DOB: Jan 27, 1946, 69 y.o.   MRN: [id number]  HPI Hinshaw is a 69 year old male who comes in today for follow-up of peripheral edema  He's had trouble peripheral edema and we had him on Hydrocort thiazide 25 mg daily. Didn't seem to be working. He came in and saw Dr. BB who started him on Lasix 40 mg daily. He's back today for follow-up. His weight is unchanged at 234 pounds. He says his leg edema is less.  He has a history of renal insufficiency   Review of Systems Review of systems negative    Objective:   Physical Exam  Well-developed well-nourished male no acute distress vital signs stable he is afebrile examination lower extremity shows 1 to trace it plus edema right and left      Assessment & Plan:  Peripheral edema much improved............. check renal function.

## 2014-08-17 NOTE — Patient Instructions (Signed)
Labs today  We will call you the reports and make any adjustments to your medication by phone

## 2014-08-22 ENCOUNTER — Telehealth: Payer: Self-pay

## 2014-08-22 MED ORDER — DOXAZOSIN MESYLATE 8 MG PO TABS: 8.0000 mg | ORAL_TABLET | Freq: Every day | ORAL | Status: DC

## 2014-08-22 NOTE — Telephone Encounter (Signed)
[place] refill request for DOXAZOSIN 8MG 

## 2014-08-22 NOTE — Telephone Encounter (Signed)
Rx sent 

## 2014-08-23 DIAGNOSIS — M79671 Pain in right foot: Secondary | ICD-10-CM | POA: Diagnosis not present

## 2014-08-23 DIAGNOSIS — M25571 Pain in right ankle and joints of right foot: Secondary | ICD-10-CM | POA: Diagnosis not present

## 2014-08-23 DIAGNOSIS — M79672 Pain in left foot: Secondary | ICD-10-CM | POA: Diagnosis not present

## 2014-08-23 DIAGNOSIS — M25572 Pain in left ankle and joints of left foot: Secondary | ICD-10-CM | POA: Diagnosis not present

## 2014-08-23 DIAGNOSIS — Z79899 Other long term (current) drug therapy: Secondary | ICD-10-CM | POA: Diagnosis not present

## 2014-08-23 DIAGNOSIS — E119 Type 2 diabetes mellitus without complications: Secondary | ICD-10-CM | POA: Diagnosis not present

## 2014-08-23 DIAGNOSIS — G5761 Lesion of plantar nerve, right lower limb: Secondary | ICD-10-CM | POA: Diagnosis not present

## 2014-08-23 DIAGNOSIS — M792 Neuralgia and neuritis, unspecified: Secondary | ICD-10-CM | POA: Diagnosis not present

## 2014-08-23 DIAGNOSIS — G609 Hereditary and idiopathic neuropathy, unspecified: Secondary | ICD-10-CM | POA: Diagnosis not present

## 2014-08-23 DIAGNOSIS — G5762 Lesion of plantar nerve, left lower limb: Secondary | ICD-10-CM | POA: Diagnosis not present

## 2014-08-25 ENCOUNTER — Encounter (HOSPITAL_COMMUNITY): Payer: Self-pay | Admitting: Emergency Medicine

## 2014-08-25 ENCOUNTER — Emergency Department (HOSPITAL_COMMUNITY): Payer: Medicare Other

## 2014-08-25 ENCOUNTER — Emergency Department (HOSPITAL_COMMUNITY)
Admission: EM | Admit: 2014-08-25 | Discharge: 2014-08-25 | Disposition: A | Discharge status: Home / Self Care | Payer: Medicare Other | Attending: Emergency Medicine | Admitting: Emergency Medicine

## 2014-08-25 DIAGNOSIS — Z87891 Personal history of nicotine dependence: Secondary | ICD-10-CM | POA: Diagnosis not present

## 2014-08-25 DIAGNOSIS — I129 Hypertensive chronic kidney disease with stage 1 through stage 4 chronic kidney disease, or unspecified chronic kidney disease: Secondary | ICD-10-CM | POA: Insufficient documentation

## 2014-08-25 DIAGNOSIS — M1990 Unspecified osteoarthritis, unspecified site: Secondary | ICD-10-CM | POA: Insufficient documentation

## 2014-08-25 DIAGNOSIS — Z85048 Personal history of other malignant neoplasm of rectum, rectosigmoid junction, and anus: Secondary | ICD-10-CM | POA: Insufficient documentation

## 2014-08-25 DIAGNOSIS — Y9389 Activity, other specified: Secondary | ICD-10-CM | POA: Diagnosis not present

## 2014-08-25 DIAGNOSIS — Z8719 Personal history of other diseases of the digestive system: Secondary | ICD-10-CM | POA: Diagnosis not present

## 2014-08-25 DIAGNOSIS — Z862 Personal history of diseases of the blood and blood-forming organs and certain disorders involving the immune mechanism: Secondary | ICD-10-CM | POA: Insufficient documentation

## 2014-08-25 DIAGNOSIS — M10352 Gout due to renal impairment, left hip: Secondary | ICD-10-CM

## 2014-08-25 DIAGNOSIS — N182 Chronic kidney disease, stage 2 (mild): Secondary | ICD-10-CM | POA: Insufficient documentation

## 2014-08-25 DIAGNOSIS — M7062 Trochanteric bursitis, left hip: Secondary | ICD-10-CM | POA: Diagnosis not present

## 2014-08-25 DIAGNOSIS — M25552 Pain in left hip: Secondary | ICD-10-CM | POA: Diagnosis not present

## 2014-08-25 DIAGNOSIS — Z79899 Other long term (current) drug therapy: Secondary | ICD-10-CM | POA: Insufficient documentation

## 2014-08-25 MED ORDER — HYDROCODONE-ACETAMINOPHEN 5-325 MG PO TABS
1.0000 | ORAL_TABLET | Freq: Once | ORAL | Status: AC
  Administered 2014-08-25: 1 via ORAL
  Filled 2014-08-25: qty 1

## 2014-08-25 MED ORDER — PREDNISONE 20 MG PO TABS
40.0000 mg | ORAL_TABLET | Freq: Every day | ORAL | Status: DC
Start: 2014-08-25 — End: 2014-10-05

## 2014-08-25 MED ORDER — HYDROCODONE-ACETAMINOPHEN 5-325 MG PO TABS: 1.0000 | ORAL_TABLET | Freq: Four times a day (QID) | ORAL | Status: DC | PRN

## 2014-08-25 NOTE — Discharge Instructions (Signed)
Your hip pain is either bursitis or possibly a gout flare. Use heat to the affected area for 20 minutes every hour. Use norco as directed for severe pain, but don't drive or operate machinery while using norco. Take prednisone as directed to help with your pain/inflammation. Stay well hydrated. See your regular doctor in 3 days for recheck. Return to the ER for changes or worsening symptoms.    Hip Bursitis Bursitis is a puffiness (swelling) and soreness of a fluid-filled sac (bursa). This sac covers and protects the joint. HOME CARE  Put ice on the injured area.  Put ice in a plastic bag.  Place a towel between your skin and the bag.  Leave the ice on for 15-20 minutes, 03-04 times a day.  Rest the painful joint as much as possible. Move your joint at least 4 times a day. When pain lessens, start normal, slow movements and normal activities.  Only take medicine as told by your doctor.  Use crutches as told.  Raise (elevate) your painful joint. Use pillows for propping your legs and hips.  Get a massage to lessen pain. GET HELP RIGHT AWAY IF:  Your pain increases or does not improve during treatment.  You have a fever.  You feel heat coming from the affected area.  You see redness and puffiness around the affected area.  You have any questions or concerns. MAKE SURE YOU:  Understand these instructions.  Will watch your condition.  Will get help right away if you are not well or get worse. Document Released: 07/26/2010 Document Revised: 09/15/2011 Document Reviewed: 07/26/2010 [facility] Patient Information 2015 [place], [place]. This information is not intended to replace advice given to you by your health care provider. Make sure you discuss any questions you have with your health care provider.  Gout Gout is an inflammatory arthritis caused by a buildup of uric acid crystals in the joints. Uric acid is a chemical that is normally present in the blood. When the level of  uric acid in the blood is too high it can form crystals that deposit in your joints and tissues. This causes joint redness, soreness, and swelling (inflammation). Repeat attacks are common. Over time, uric acid crystals can form into masses (tophi) near a joint, destroying bone and causing disfigurement. Gout is treatable and often preventable. CAUSES  The disease begins with elevated levels of uric acid in the blood. Uric acid is produced by your body when it breaks down a naturally found substance called purines. Certain foods you eat, such as meats and fish, contain high amounts of purines. Causes of an elevated uric acid level include:  Being passed down from parent to child (heredity).  Diseases that cause increased uric acid production (such as obesity, psoriasis, and certain cancers).  Excessive alcohol use.  Diet, especially diets rich in meat and seafood.  Medicines, including certain cancer-fighting medicines (chemotherapy), water pills (diuretics), and aspirin.  Chronic kidney disease. The kidneys are no longer able to remove uric acid well.  Problems with metabolism. Conditions strongly associated with gout include:  Obesity.  High blood pressure.  High cholesterol.  Diabetes. Not everyone with elevated uric acid levels gets gout. It is not understood why some people get gout and others do not. Surgery, joint injury, and eating too much of certain foods are some of the factors that can lead to gout attacks. SYMPTOMS   An attack of gout comes on quickly. It causes intense pain with redness, swelling, and warmth in  a joint.  Fever can occur.  Often, only one joint is involved. Certain joints are more commonly involved:  Base of the big toe.  Knee.  Ankle.  Wrist.  Finger. Without treatment, an attack usually goes away in a few days to weeks. Between attacks, you usually will not have symptoms, which is different from many other forms of arthritis. DIAGNOSIS    Your caregiver will suspect gout based on your symptoms and exam. In some cases, tests may be recommended. The tests may include:  Blood tests.  Urine tests.  X-rays.  Joint fluid exam. This exam requires a needle to remove fluid from the joint (arthrocentesis). Using a microscope, gout is confirmed when uric acid crystals are seen in the joint fluid. TREATMENT  There are two phases to gout treatment: treating the sudden onset (acute) attack and preventing attacks (prophylaxis).  Treatment of an Acute Attack.  Medicines are used. These include anti-inflammatory medicines or steroid medicines.  An injection of steroid medicine into the affected joint is sometimes necessary.  The painful joint is rested. Movement can worsen the arthritis.  You may use warm or cold treatments on painful joints, depending which works best for you.  Treatment to Prevent Attacks.  If you suffer from frequent gout attacks, your caregiver may advise preventive medicine. These medicines are started after the acute attack subsides. These medicines either help your kidneys eliminate uric acid from your body or decrease your uric acid production. You may need to stay on these medicines for a very long time.  The early phase of treatment with preventive medicine can be associated with an increase in acute gout attacks. For this reason, during the first few months of treatment, your caregiver may also advise you to take medicines usually used for acute gout treatment. Be sure you understand your caregiver's directions. Your caregiver may make several adjustments to your medicine dose before these medicines are effective.  Discuss dietary treatment with your caregiver or dietitian. Alcohol and drinks high in sugar and fructose and foods such as meat, poultry, and seafood can increase uric acid levels. Your caregiver or dietitian can advise you on drinks and foods that should be limited. HOME CARE INSTRUCTIONS   Do  not take aspirin to relieve pain. This raises uric acid levels.  Only take over-the-counter or prescription medicines for pain, discomfort, or fever as directed by your caregiver.  Rest the joint as much as possible. When in bed, keep sheets and blankets off painful areas.  Keep the affected joint raised (elevated).  Apply warm or cold treatments to painful joints. Use of warm or cold treatments depends on which works best for you.  Use crutches if the painful joint is in your leg.  Drink enough fluids to keep your urine clear or pale yellow. This helps your body get rid of uric acid. Limit alcohol, sugary drinks, and fructose drinks.  Follow your dietary instructions. Pay careful attention to the amount of protein you eat. Your daily diet should emphasize fruits, vegetables, whole grains, and fat-free or low-fat milk products. Discuss the use of coffee, vitamin C, and cherries with your caregiver or dietitian. These may be helpful in lowering uric acid levels.  Maintain a healthy body weight. SEEK MEDICAL CARE IF:   You develop diarrhea, vomiting, or any side effects from medicines.  You do not feel better in 24 hours, or you are getting worse. SEEK IMMEDIATE MEDICAL CARE IF:   Your joint becomes suddenly more tender, and  you have chills or a fever. MAKE SURE YOU:   Understand these instructions.  Will watch your condition.  Will get help right away if you are not doing well or get worse. Document Released: 06/20/2000 Document Revised: 11/07/2013 Document Reviewed: 02/04/2012 [facility] Patient Information 2015 [place], [place]. This information is not intended to replace advice given to you by your health care provider. Make sure you discuss any questions you have with your health care provider.  Heat Therapy Heat therapy can help make painful, stiff muscles and joints feel better. Do not use heat on new injuries. Wait at least 48 hours after an injury to use heat. Do not use heat  when you have aches or pains right after an activity. If you still have pain 3 hours after stopping the activity, then you may use heat. HOME CARE Wet heat pack  Soak a clean towel in warm water. Squeeze out the extra water.  Put the warm, wet towel in a plastic bag.  Place a thin, dry towel between your skin and the bag.  Put the heat pack on the area for 5 minutes, and check your skin. Your skin may be pink, but it should not be red.  Leave the heat pack on the area for 15 to 30 minutes.  Repeat this every 2 to 4 hours while awake. Do not use heat while you are sleeping. Warm water bath  Fill a tub with warm water.  Place the affected body part in the tub.  Soak the area for 20 to 40 minutes.  Repeat as needed. Hot water bottle  Fill the water bottle half full with hot water.  Press out the extra air. Close the cap tightly.  Place a dry towel between your skin and the bottle.  Put the bottle on the area for 5 minutes, and check your skin. Your skin may be pink, but it should not be red.  Leave the bottle on the area for 15 to 30 minutes.  Repeat this every 2 to 4 hours while awake. Electric heating pad  Place a dry towel between your skin and the heating pad.  Set the heating pad on low heat.  Put the heating pad on the area for 10 minutes, and check your skin. Your skin may be pink, but it should not be red.  Leave the heating pad on the area for 20 to 40 minutes.  Repeat this every 2 to 4 hours while awake.  Do not lie on the heating pad.  Do not fall asleep while using the heating pad.  Do not use the heating pad near water. GET HELP RIGHT AWAY IF:  You get blisters or red skin.  Your skin is puffy (swollen), or you lose feeling (numbness) in the affected area.  You have any new problems.  Your problems are getting worse.  You have any questions or concerns. If you have any problems, stop using heat therapy until you see your doctor. MAKE SURE  YOU:  Understand these instructions.  Will watch your condition.  Will get help right away if you are not doing well or get worse. Document Released: 09/15/2011 Document Reviewed: 08/16/2013 [facility] Patient Information 2015 [place]. This information is not intended to replace advice given to you by your health care provider. Make sure you discuss any questions you have with your health care provider.

## 2014-08-25 NOTE — ED Provider Notes (Signed)
CSN: [id number]     Arrival date & time 08/25/14  1513 History  This chart was scribed for non-physician practitioner, Zacarias Pontes, PA-C working with Ernestina Patches, MD, by Erling Conte, ED Scribe. This patient was seen in room WTR5/WTR5 and the patient's care was started at 4:04 PM     Chief Complaint  Patient presents with  . Hip Pain    Patient is a 69 y.o. male presenting with hip pain. The history is provided by the patient. No language interpreter was used.  Hip Pain This is a new problem. The current episode started 6 to 12 hours ago. The problem occurs constantly. The problem has not changed since onset.Pertinent negatives include no chest pain, no abdominal pain, no headaches and no shortness of breath. The symptoms are aggravated by walking, bending, standing, exertion and twisting. Nothing relieves the symptoms. He has tried nothing for the symptoms. The treatment provided no relief.    HPI Comments: Jason [name] is a 69 y.o. male with a h/o gout, rectal cancer s/p radiation and chemotherapy now in remission, anemia, arthritis, HTN, diverticulosis, hyperplastic colon polyp s/p resection, and stage II renal insuffiency, who presents to the Emergency Department complaining of intermittent, "10/10", "sore", left hip pain that began this morning when he stood up from bed upon awakening. He states the pain started when he was getting up this morning to use the restroom. The pain radiates down his left lateral thigh, approx midway down thigh. The pain is exacerbated by movement. He denies any relief from the pain, but has not tried anything specific to help with his symptoms. He denies any injury to the area or recent heavy lifting or prolonged sitting. No h/o gout in his hip, usually in his knees. He takes allopurinol daily. He denies any recent alcohol or red meat intake. He denies any swelling, warmth or color change to L hip, fever, chills, chest pain, SOB, abdominal pain,  nausea, vomiting, diarrhea, hematuria, dysuria, testicular pain or swelling, or penile discharge. Denies numbness, tingling, or weakness.     Past Medical History  Diagnosis Date  . Gout   . Hypertension   . Hyperplastic colon polyp   . Diverticulosis   . Anemia   . Arthritis   . ED (erectile dysfunction)   . Chronic renal insufficiency, stage II (mild)   . History of radiation therapy 02/21/13-03/31/13    rectum 50.4Gy total dose  . rectal ca dx'd 02/01/13    rectal. Radiation and chemotherapy- remains on chemotherapy-next tx. 08-22-13.    Past Surgical History  Procedure Laterality Date  . Colonoscopy  2010    [facility]  . Eus N/A 02/03/2013    Procedure: LOWER ENDOSCOPIC ULTRASOUND (EUS);  Surgeon: Milus Banister, MD;  Location: [facility];  Service: Endoscopy;  Laterality: N/A;  . Laparoscopic low anterior resection N/A 05/20/2013    Procedure: LAPAROSCOPIC LOW ANTERIOR RESECTION WITH SPLENIC FLEXURE MOBILIZATION;  Surgeon: Leighton Ruff, MD;  Location: [facility];  Service: General;  Laterality: N/A;  . Ostomy N/A 05/20/2013    Procedure: diverting OSTOMY;  Surgeon: Leighton Ruff, MD;  Location: [facility];  Service: General;  Laterality: N/A;  . Colon surgery  05/20/2013  . Ileostomy closure N/A 08/24/2013    Procedure: CLOSURE OF LOOP ILEOSTOMY ;  Surgeon: Leighton Ruff, MD;  Location: [facility];  Service: General;  Laterality: N/A;   Family History  Problem Relation Age of Onset  . Hypertension Other   . Stroke Mother   .  Stroke Father   . Colon cancer Neg Hx    History  Substance Use Topics  . Smoking status: Former Smoker -- 0.50 packs/day for 10 years    Quit date: 07/07/1977  . Smokeless tobacco: Never Used  . Alcohol Use: No     Comment: pint of whisky every 2 weeks and occasional beer until about a month ago-Quit 10'14     Review of Systems  Constitutional: Negative for fever and chills.  Respiratory: Negative for shortness of breath.   Cardiovascular: Negative  for chest pain.  Gastrointestinal: Negative for nausea, vomiting, abdominal pain and diarrhea.  Genitourinary: Negative for dysuria, hematuria, flank pain, discharge, scrotal swelling and testicular pain.  Musculoskeletal: Positive for myalgias (L lateral thigh) and arthralgias (left hip). Negative for back pain, joint swelling and gait problem.  Skin: Negative for color change.  Allergic/Immunologic: Negative for immunocompromised state.  Neurological: Negative for weakness, numbness and headaches.  Hematological: Does not bruise/bleed easily.  10 Systems reviewed and all are negative for acute change except as noted in the HPI.       Allergies  Nsaids  Home Medications   Prior to Admission medications   Medication Sig Start Date End Date Taking? Authorizing Provider  acetaminophen (TYLENOL) 325 MG tablet Take 2 tablets (650 mg total) by mouth every 4 (four) hours as needed for moderate pain. 95/62/13  Yes Leighton Ruff, MD  allopurinol (ZYLOPRIM) 300 MG tablet Take 1 tablet (300 mg total) by mouth every morning. 08/10/13  Yes [name], MD  carvedilol (COREG) 6.25 MG tablet Take 1 tablet (6.25 mg total) by mouth 2 (two) times daily with a meal. 08/10/13  Yes [name], MD  doxazosin (CARDURA) 8 MG tablet Take 1 tablet (8 mg total) by mouth at bedtime. 08/22/14  Yes [name], MD  furosemide (LASIX) 40 MG tablet Take 1 tablet (40 mg total) by mouth daily. 08/02/14  Yes Eulas Post, MD  loperamide (IMODIUM) 2 MG capsule Take 1 capsule (2 mg total) by mouth as needed for diarrhea or loose stools. 0/86/57  Yes Leighton Ruff, MD  polycarbophil (FIBERCON) 625 MG tablet Take 1,250 mg by mouth daily.   Yes Historical Provider, MD  Vitamin D, Ergocalciferol, (DRISDOL) 50000 UNITS CAPS capsule Take 50,000 Units by mouth every 7 (seven) days.   Yes Historical Provider, MD   Triage Vitals: BP 165/90 mmHg  Pulse 74  Temp(Src) 98.3 F (36.8 C) (Oral)  Resp 18  SpO2  100%  Physical Exam  Constitutional: He is oriented to person, place, and time. Vital signs are normal. He appears well-developed and well-nourished.  Non-toxic appearance. No distress.  Afebrile, nontoxic, NAD, VSS  HENT:  Head: Normocephalic and atraumatic.  Mouth/Throat: Mucous membranes are normal.  Eyes: Conjunctivae and EOM are normal. Right eye exhibits no discharge. Left eye exhibits no discharge.  Neck: Normal range of motion. Neck supple.  Cardiovascular: Normal rate and intact distal pulses.   Pulmonary/Chest: Effort normal. No respiratory distress.  Abdominal: Normal appearance. He exhibits no distension.  Musculoskeletal: Normal range of motion.       Left hip: He exhibits tenderness. He exhibits normal range of motion, normal strength, no bony tenderness, no swelling, no crepitus and no deformity.       Legs: L hip with FROM intact, no crepitus or deformity, with TTP over greater trochanteric bursa and into the TFL musculature, no erythema or warmth, no swelling, no exquisite bony TTP, no joint line TTP.  Gait steady, strength 5/5 in all extremities, sensation grossly intact, distal pulses intact. All spinal levels nonTTP without bony step offs or deformities, no paraspinous muscle TTP or spasm  Neurological: He is alert and oriented to person, place, and time. He has normal strength. No sensory deficit. Gait normal.  Skin: Skin is warm, dry and intact. No rash noted. No erythema.  No erythema or warmth of exposed surfaces  Psychiatric: He has a normal mood and affect. His behavior is normal.  Nursing note and vitals reviewed.   ED Course  Procedures (including critical care time)  DIAGNOSTIC STUDIES: Oxygen Saturation is 100% on RA, normal by my interpretation.    COORDINATION OF CARE:    Labs Review Labs Reviewed - No data to display  Imaging Review Dg Hip Unilat With Pelvis 2-3 Views Left  08/25/2014   CLINICAL DATA:  Acute left hip pain since earlier today.   EXAM: LEFT HIP (WITH PELVIS) 2-3 VIEWS  COMPARISON:  06/13/2013  FINDINGS: Intact left hip with normal alignment. No acute fracture. Hips are symmetric. Bony pelvis intact. No diastases.  IMPRESSION: No acute osseous finding.   Electronically Signed   By: Jerilynn Mages.  Shick M.D.   On: 08/25/2014 17:13     EKG Interpretation None      MDM   Final diagnoses:  Left hip pain  Greater trochanteric bursitis of left hip  Acute gout due to renal impairment involving left hip    69 y.o. male with sudden onset L hip pain this morning, no precipitating events, ambulatory with b/l extremities neurovascularly intact. Hip without swelling or crepitus, FROM intact, no erythema or warmth, mild TTP over greater trochanteric bursa and slightly down into the TFL. Has hx of gout, therefore could be gout flare, but has not had any EtOH or red meat. Has hx of rectal cancer, s/p radiation and chemo, last saw oncologist in 05/2014 and in full remission, MRI and CT done on abd/pelvis at that time showed no lytic lesions. Given this hx, will obtain xray imaging to ensure no stress fracture vs lytic lesion vs joint effusion. Doubt septic joint. Will give pain med here and then re-evaluate after imaging. Very likely gout or bursitis. Recently had kidney function done and noted to be slightly worse than previously, Cr. 3.87 and BUN 64, which could have resulted in gout attack. Will reassess shortly.   5:21 PM Xray without lytic lesions or large effusions, no acute findings. Will give prednisone for gout/bursitis (since pt has renal insufficiency, will avoid nephrotoxic drugs), and discussed use of heat to the area. Will give small amount of narcotic meds for severe pain. Will have him f/up with PCP in 3 days for recheck. I explained the diagnosis and have given explicit precautions to return to the ER including for any other new or worsening symptoms. The patient understands and accepts the medical plan as it's been dictated and I  have answered their questions. Discharge instructions concerning home care and prescriptions have been given. The patient is STABLE and is discharged to home in good condition.   I personally performed the services described in this documentation, which was scribed in my presence. The recorded information has been reviewed and is accurate.  BP 165/90 mmHg  Pulse 74  Temp(Src) 98.3 F (36.8 C) (Oral)  Resp 18  SpO2 100%  Meds ordered this encounter  Medications  . HYDROcodone-acetaminophen (NORCO/VICODIN) 5-325 MG per tablet 1 tablet    Sig:   . predniSONE (DELTASONE) 20  MG tablet    Sig: Take 2 tablets (40 mg total) by mouth daily with breakfast. X 5 days    Dispense:  10 tablet    Refill:  0    Order Specific Question:  Supervising Provider    Answer:  Noemi Chapel D [[id number]]  . HYDROcodone-acetaminophen (NORCO) 5-325 MG per tablet    Sig: Take 1 tablet by mouth every 6 (six) hours as needed for severe pain.    Dispense:  6 tablet    Refill:  0    Order Specific Question:  Supervising Provider    Answer:  Johnna Acosta [address] [place], PA-C 08/25/14 1722  Ernestina Patches, MD 08/25/14 2354

## 2014-08-25 NOTE — ED Notes (Signed)
Pt c/o L hip pain since this morning. Pt denies injury. Pt sts he stood up to go to the bathroom and it started to hurt. Pt denies any hx of previous injury. A&Ox4. Pt ambulatory with soreness.

## 2014-08-31 DIAGNOSIS — M71571 Other bursitis, not elsewhere classified, right ankle and foot: Secondary | ICD-10-CM | POA: Diagnosis not present

## 2014-08-31 DIAGNOSIS — M792 Neuralgia and neuritis, unspecified: Secondary | ICD-10-CM | POA: Diagnosis not present

## 2014-08-31 DIAGNOSIS — G5751 Tarsal tunnel syndrome, right lower limb: Secondary | ICD-10-CM | POA: Diagnosis not present

## 2014-09-01 ENCOUNTER — Ambulatory Visit: Payer: Medicare Other | Admitting: Oncology

## 2014-09-01 ENCOUNTER — Other Ambulatory Visit: Payer: Medicare Other

## 2014-09-05 ENCOUNTER — Ambulatory Visit (INDEPENDENT_AMBULATORY_CARE_PROVIDER_SITE_OTHER): Payer: Medicare Other | Admitting: Family Medicine

## 2014-09-05 ENCOUNTER — Encounter: Payer: Self-pay | Admitting: Family Medicine

## 2014-09-05 VITALS — BP 140/90 | Temp 99.2°F | Wt 238.0 lb

## 2014-09-05 DIAGNOSIS — N182 Chronic kidney disease, stage 2 (mild): Secondary | ICD-10-CM | POA: Diagnosis not present

## 2014-09-05 NOTE — Progress Notes (Signed)
Pre visit review using our clinic review tool, if applicable. No additional management support is needed unless otherwise documented below in the visit note. 

## 2014-09-05 NOTE — Progress Notes (Signed)
   Subjective:    Patient ID: Jason [name], male    DOB: [birth year], 69 y.o.   MRN: [id number]  HPI  Jason [name] is a 69 year old male who comes in today for follow-up of peripheral edema  He states he went to the emergency room on February 19. That morning he woke up with severe hip pain. Diagnostic workup showed no lytic lesions. They sent him home with symptomatic therapy he used a heating pad did a lot of rest he took some hydrocodone 5 mg he was given a short course of prednisone and the pain subsided.  We saw him a couple weeks ago. At that time he was on 25 mg of hydrochlorothiazide 5 me and in my absence Dr. BB switched him to 40 mg of Lasix because he was complaining of peripheral edema. However his creatinine went from 26-37. The Lasix is discontinued. We also need to do a follow-up renal function today. He has an appointment to see nephrology on this coming Thursday  Review of Systems Review of systems otherwise negative    Objective:   Physical Exam  Well-developed well-nourished male no acute distress vital signs stable he is afebrile BP today 140/90      Assessment & Plan:  Chronic renal insufficiency..........Marland Kitchen recheck labs  Peripheral edema.......... hold all diuretics until we get the report of his renal function and we get a consult from nephrology

## 2014-09-05 NOTE — Patient Instructions (Signed)
Continue no salt diet  Keep your feet elevated as much as possible  Do not take any diuretics until we see nephrology on Thursday  Labs today we'll call you the report

## 2014-09-06 LAB — BASIC METABOLIC PANEL
BUN: 38 mg/dL — ABNORMAL HIGH (ref 6–23)
CO2: 30 mEq/L (ref 19–32)
Calcium: 9.9 mg/dL (ref 8.4–10.5)
Chloride: 101 mEq/L (ref 96–112)
Creatinine, Ser: 2.49 mg/dL — ABNORMAL HIGH (ref 0.40–1.50)
GFR: 33.37 mL/min — AB (ref >60.00)
Glucose, Bld: 92 mg/dL (ref 70–99)
POTASSIUM: 4.2 meq/L (ref 3.5–5.1)
Sodium: 138 mEq/L (ref 135–145)

## 2014-09-07 DIAGNOSIS — N2581 Secondary hyperparathyroidism of renal origin: Secondary | ICD-10-CM | POA: Diagnosis not present

## 2014-09-07 DIAGNOSIS — N184 Chronic kidney disease, stage 4 (severe): Secondary | ICD-10-CM | POA: Diagnosis not present

## 2014-09-07 DIAGNOSIS — I10 Essential (primary) hypertension: Secondary | ICD-10-CM | POA: Diagnosis not present

## 2014-09-07 DIAGNOSIS — D631 Anemia in chronic kidney disease: Secondary | ICD-10-CM | POA: Diagnosis not present

## 2014-09-08 DIAGNOSIS — M792 Neuralgia and neuritis, unspecified: Secondary | ICD-10-CM | POA: Diagnosis not present

## 2014-09-11 ENCOUNTER — Telehealth: Payer: Self-pay | Admitting: Family Medicine

## 2014-09-11 NOTE — Telephone Encounter (Signed)
Noted  

## 2014-09-11 NOTE — Telephone Encounter (Signed)
Pt needed a late day appt so I used SDA Thursday 4:15 pm

## 2014-09-14 ENCOUNTER — Encounter: Payer: Self-pay | Admitting: Family Medicine

## 2014-09-14 ENCOUNTER — Ambulatory Visit (INDEPENDENT_AMBULATORY_CARE_PROVIDER_SITE_OTHER): Payer: Medicare Other | Admitting: Family Medicine

## 2014-09-14 VITALS — BP 120/80 | Temp 99.1°F | Wt 232.0 lb

## 2014-09-14 DIAGNOSIS — N184 Chronic kidney disease, stage 4 (severe): Secondary | ICD-10-CM | POA: Diagnosis not present

## 2014-09-14 DIAGNOSIS — N189 Chronic kidney disease, unspecified: Secondary | ICD-10-CM | POA: Diagnosis not present

## 2014-09-14 DIAGNOSIS — I10 Essential (primary) hypertension: Secondary | ICD-10-CM | POA: Diagnosis not present

## 2014-09-14 DIAGNOSIS — G609 Hereditary and idiopathic neuropathy, unspecified: Secondary | ICD-10-CM

## 2014-09-14 DIAGNOSIS — R609 Edema, unspecified: Secondary | ICD-10-CM | POA: Diagnosis not present

## 2014-09-14 DIAGNOSIS — N182 Chronic kidney disease, stage 2 (mild): Secondary | ICD-10-CM | POA: Diagnosis not present

## 2014-09-14 DIAGNOSIS — R739 Hyperglycemia, unspecified: Secondary | ICD-10-CM

## 2014-09-14 DIAGNOSIS — N2581 Secondary hyperparathyroidism of renal origin: Secondary | ICD-10-CM | POA: Diagnosis not present

## 2014-09-14 HISTORY — DX: Hereditary and idiopathic neuropathy, unspecified: G60.9

## 2014-09-14 LAB — GLUCOSE, POCT (MANUAL RESULT ENTRY): POC Glucose: 100 mg/dl — AB (ref 70–99)

## 2014-09-14 NOTE — Patient Instructions (Signed)
Lasix 40 mg.........Marland Kitchen 1 tablet Monday Wednesday Friday  Neurontin 300 mg..... One at bedtime  Check your blood pressure weekly  Return in June for follow-up........ Beaulah Dinning

## 2014-09-14 NOTE — Progress Notes (Signed)
   Subjective:    Patient ID: Jason [name], male    DOB: 1945/09/25, 69 y.o.   MRN: [id number]  HPI Jason [name] is a 69 year old male married nonsmoker who comes in today for evaluation of multiple problems  We had him on hydrochlorothiazide 12.5 mg daily and that was switched to Lasix. On the Lasix his creatinine went up therefore I stop the Lasix. We get a nephrology consult recently. They saw him last Thursday in 12 months okay to take the Lasix 40 mg every day.  He went to see a podiatrist about his foot pain. He was told he had diabetes. His random blood sugar today is 100 his A1c is 6.5%. He does not have diabetes  He was given Neurontin 300 mg 3 times daily  Blood pressure today 120/80 on Coreg 6.25 mg twice a day and Cardura 8 mg daily.   Review of Systems    review of systems otherwise negative Objective:   Physical Exam  Well-developed well-nourished male no acute distress vital signs stable he is afebrile weight down to 232 pounds BP 120/80  Random blood sugar at 4 PM in the afternoon is 100      Assessment & Plan:  Peripheral edema.........Marland Kitchen Lasix daily........ consider Monday Wednesday Friday  Peripheral neuropathy........ I am okay with the Neurontin but not as a starting dose of 300 mg a day........Marland Kitchen recommend 300 mg once daily at bedtime  Hypertension ago continue current therapy

## 2014-09-16 ENCOUNTER — Other Ambulatory Visit: Payer: Self-pay | Admitting: Oncology

## 2014-09-18 ENCOUNTER — Telehealth: Payer: Self-pay | Admitting: *Deleted

## 2014-09-18 NOTE — Telephone Encounter (Signed)
Patient called requesting refill for Klor-Con 20 meq.  Takes one po daily and has one pill left.  Noted refill request via [place].  Printed for provider review.

## 2014-09-19 NOTE — Telephone Encounter (Signed)
Notified pt that Dr. Benay Spice has not prescribed potassium for him to take (not on our current med list for pt); pt verbalized understanding and looked on bottle "maybe my kidney doctor, will call them"

## 2014-09-20 DIAGNOSIS — N184 Chronic kidney disease, stage 4 (severe): Secondary | ICD-10-CM | POA: Diagnosis not present

## 2014-10-05 ENCOUNTER — Ambulatory Visit (HOSPITAL_BASED_OUTPATIENT_CLINIC_OR_DEPARTMENT_OTHER): Payer: Medicare Other | Admitting: Oncology

## 2014-10-05 ENCOUNTER — Other Ambulatory Visit (HOSPITAL_BASED_OUTPATIENT_CLINIC_OR_DEPARTMENT_OTHER): Payer: Medicare Other

## 2014-10-05 ENCOUNTER — Telehealth: Payer: Self-pay | Admitting: Oncology

## 2014-10-05 VITALS — BP 151/83 | HR 79 | Temp 98.9°F | Resp 19 | Ht 68.0 in | Wt 235.5 lb

## 2014-10-05 DIAGNOSIS — C20 Malignant neoplasm of rectum: Secondary | ICD-10-CM

## 2014-10-05 DIAGNOSIS — N189 Chronic kidney disease, unspecified: Secondary | ICD-10-CM | POA: Diagnosis not present

## 2014-10-05 NOTE — Progress Notes (Signed)
  [facility] OFFICE PROGRESS NOTE   Diagnosis: Rectal cancer  INTERVAL HISTORY:   Jason [name] returns as scheduled. He feels well. No difficulty with bowel control. He recently had pain in one of his hips. This has improved.  Objective:  Vital signs in last 24 hours:  Blood pressure 151/83, pulse 79, temperature 98.9 F (37.2 C), temperature source Oral, resp. rate 19, height 5\' 8"  (1.727 m), weight 235 lb 8 oz (106.822 kg), SpO2 100 %.    HEENT: Neck without mass Lymphatics: No cervical, supra-clavicular, axillary, or inguinal nodes Resp: Lungs clear bilaterally Cardio: Regular rate and rhythm GI: No hepatomegaly, nontender, no mass Vascular: No leg edema   Lab Results:    Lab Results  Component Value Date   CEA 1.1 05/29/2014   CEA pending today    Medications: I have reviewed the patient's current medications.  Assessment/Plan: 1. Rectal cancer, clinical stage III (uT3, uN2).  Initiation of concurrent Xeloda and radiation on 02/21/2013, completed 03/31/2013.   CEA normal 04/25/2013   Status post laparoscopic low anterior resection with diverting loop ileostomy 05/23/2013. Final pathology showed a 3.2 cm invasive adenocarcinoma with neoadjuvant related change. Tumor invaded into perirectal soft tissue. There was no lymphovascular or perineural invasion. 14 lymph nodes were negative for tumor. Surgical margins were negative. There was one hyperplastic polyp (ypT3, pN0).   Initiation of weekly 5-FU/leucovorin 08/01/2013   Ileostomy takedown 08/24/2013.   Weekly 5-FU/leucovorin resumed 09/22/2013.  Restaging CTs 02/20/2014 negative for evidence of recurrent disease  Surveillance colonoscopy 03/31/2014, status post removal of of a tubular adenoma from the ascending colon 2. Markedly elevated pretreatment CEA 3. Indeterminate pulmonary/subpleural nodules and right hepatic dome lesion. Unchanged on the CT 04/25/2013, unchanged nodular lung  lesions on the CT 02/20/2014 4. Renal insufficiency. Acute on chronic renal failure during hospitalization November/December 2014. Improved following the ileostomy takedown. 5. History of Anemia secondary chemotherapy, chronic disease, and renal insufficiency 6. Hypertension. 7. Gout. 8. Prolonged postoperative ileus following laparoscopic low anterior resection with diverting loop ileostomy 05/23/2013. 9. C. difficile-positive 09/08/2013-status post Flagyl with improvement. Recurrent diarrhea following completion of Flagyl. Stool positive for C. difficile on 09/24/2013. He completed treatment with vancomycin. The diarrhea resolved. 10. Left greater than right low leg edema. Venous Doppler 09/26/2013 negative for left leg DVT. 11. Rectal urgency/incontinence-Improved with participation in the pelvic physical therapy clinic.  Disposition:  Jason [name] remains in clinical remission from rectal cancer. We will follow-up on the CEA from today. He will return for an office visit and restaging CT evaluation in 6 months.  Betsy Coder, MD  10/05/2014  2:32 PM

## 2014-10-05 NOTE — Telephone Encounter (Signed)
Pt confirmed labs/ov per 03/31 POF, gave pt AVS and Calendar.... KJ

## 2014-10-06 ENCOUNTER — Telehealth: Payer: Self-pay | Admitting: Family Medicine

## 2014-10-06 LAB — CEA: CEA: 1 ng/mL (ref 0.0–5.0)

## 2014-10-06 MED ORDER — POTASSIUM CHLORIDE CRYS ER 20 MEQ PO TBCR: 20.0000 meq | EXTENDED_RELEASE_TABLET | Freq: Two times a day (BID) | ORAL | Status: DC

## 2014-10-06 NOTE — Telephone Encounter (Signed)
Pt needs refill on klor-con meq 20 call into [facility] [address]. Pt is out

## 2014-10-06 NOTE — Telephone Encounter (Signed)
rx sent

## 2014-10-09 ENCOUNTER — Telehealth: Payer: Self-pay | Admitting: *Deleted

## 2014-10-09 NOTE — Telephone Encounter (Signed)
Called pt with normal CEA results. He voiced understanding.

## 2014-10-09 NOTE — Telephone Encounter (Signed)
-----   Message from [name], MD sent at 10/07/2014  7:28 PM EDT ----- Please call patient, Jason [name] is normal

## 2014-10-09 NOTE — Telephone Encounter (Signed)
D Jason [name] to see if she got the e-mail from Dr. Lisbeth Renshaw and myself per his note to change patient's scheule to 2 months out for follow up, "Yes, I got your e-mail message" per Jason [name] 1:24 PM

## 2014-10-16 ENCOUNTER — Ambulatory Visit: Admission: RE | Admit: 2014-10-16 | Payer: Medicare Other | Source: Ambulatory Visit | Admitting: Radiation Oncology

## 2014-10-16 ENCOUNTER — Ambulatory Visit: Payer: Medicare Other

## 2014-10-20 DIAGNOSIS — G5752 Tarsal tunnel syndrome, left lower limb: Secondary | ICD-10-CM | POA: Diagnosis not present

## 2014-10-20 DIAGNOSIS — M792 Neuralgia and neuritis, unspecified: Secondary | ICD-10-CM | POA: Diagnosis not present

## 2014-10-20 DIAGNOSIS — M71572 Other bursitis, not elsewhere classified, left ankle and foot: Secondary | ICD-10-CM | POA: Diagnosis not present

## 2014-10-23 ENCOUNTER — Other Ambulatory Visit: Payer: Self-pay | Admitting: *Deleted

## 2014-10-23 DIAGNOSIS — M109 Gout, unspecified: Secondary | ICD-10-CM

## 2014-10-23 MED ORDER — ALLOPURINOL 300 MG PO TABS: 300.0000 mg | ORAL_TABLET | Freq: Every morning | ORAL | Status: DC

## 2014-10-25 DIAGNOSIS — H40052 Ocular hypertension, left eye: Secondary | ICD-10-CM | POA: Diagnosis not present

## 2014-10-25 DIAGNOSIS — H401212 Low-tension glaucoma, right eye, moderate stage: Secondary | ICD-10-CM | POA: Diagnosis not present

## 2014-10-25 DIAGNOSIS — H40033 Anatomical narrow angle, bilateral: Secondary | ICD-10-CM | POA: Diagnosis not present

## 2014-10-25 DIAGNOSIS — H401221 Low-tension glaucoma, left eye, mild stage: Secondary | ICD-10-CM | POA: Diagnosis not present

## 2014-10-30 DIAGNOSIS — N189 Chronic kidney disease, unspecified: Secondary | ICD-10-CM | POA: Diagnosis not present

## 2014-10-30 DIAGNOSIS — N184 Chronic kidney disease, stage 4 (severe): Secondary | ICD-10-CM | POA: Diagnosis not present

## 2014-10-30 DIAGNOSIS — N2581 Secondary hyperparathyroidism of renal origin: Secondary | ICD-10-CM | POA: Diagnosis not present

## 2014-11-02 DIAGNOSIS — C20 Malignant neoplasm of rectum: Secondary | ICD-10-CM | POA: Diagnosis not present

## 2014-11-07 DIAGNOSIS — N184 Chronic kidney disease, stage 4 (severe): Secondary | ICD-10-CM | POA: Diagnosis not present

## 2014-11-07 DIAGNOSIS — I10 Essential (primary) hypertension: Secondary | ICD-10-CM | POA: Diagnosis not present

## 2014-11-07 DIAGNOSIS — N2581 Secondary hyperparathyroidism of renal origin: Secondary | ICD-10-CM | POA: Diagnosis not present

## 2014-11-07 DIAGNOSIS — D631 Anemia in chronic kidney disease: Secondary | ICD-10-CM | POA: Diagnosis not present

## 2014-11-24 DIAGNOSIS — M792 Neuralgia and neuritis, unspecified: Secondary | ICD-10-CM | POA: Diagnosis not present

## 2014-12-11 ENCOUNTER — Other Ambulatory Visit: Payer: Self-pay | Admitting: *Deleted

## 2014-12-11 MED ORDER — FUROSEMIDE 80 MG PO TABS: 80.0000 mg | ORAL_TABLET | Freq: Every day | ORAL | Status: DC

## 2014-12-21 ENCOUNTER — Ambulatory Visit: Admission: RE | Admit: 2014-12-21 | Payer: Medicare Other | Source: Ambulatory Visit | Admitting: Radiation Oncology

## 2014-12-29 ENCOUNTER — Encounter: Payer: Self-pay | Admitting: Radiation Oncology

## 2014-12-29 ENCOUNTER — Ambulatory Visit
Admission: RE | Admit: 2014-12-29 | Discharge: 2014-12-29 | Disposition: A | Discharge status: Home / Self Care | Payer: Medicare Other | Source: Ambulatory Visit | Attending: Radiation Oncology | Admitting: Radiation Oncology

## 2014-12-29 VITALS — BP 146/76 | HR 63 | Temp 98.0°F | Resp 16 | Ht 68.0 in | Wt 237.0 lb

## 2014-12-29 DIAGNOSIS — C20 Malignant neoplasm of rectum: Secondary | ICD-10-CM | POA: Diagnosis not present

## 2014-12-29 NOTE — Progress Notes (Signed)
Radiation Oncology         [phone] ________________________________  Name: Jason [name] MRN: [id number]  Date: 12/29/2014  DOB: 03/20/1946  Follow-Up Visit Note  CC: Joycelyn Man, MD  Stark Klein, MD  Diagnosis:   Rectal cancer  Interval Since Last Radiation:  The patient completed radiation treatment in September of 2014   Narrative:  [name] here for follow up.  He denies pain and nausea. He reports occasional diarrhea and takes Imodium as needed.  He denies having any rectal bleeding.  He reports having a good appetite and energy level.    BP 146/76 mmHg  Pulse 63  Temp(Src) 98 F (36.7 C) (Oral)  Resp 16  Ht '5\' 8"'$  (1.727 m)  Wt 237 lb (107.502 kg)  BMI 36.04 kg/m2                      ALLERGIES:  is allergic to nsaids.  Meds: Current Outpatient Prescriptions  Medication Sig Dispense Refill  . acetaminophen (TYLENOL) 325 MG tablet Take 2 tablets (650 mg total) by mouth every 4 (four) hours as needed for moderate pain.    Marland Kitchen allopurinol (ZYLOPRIM) 300 MG tablet Take 1 tablet (300 mg total) by mouth every morning. 45 tablet 5  . carvedilol (COREG) 6.25 MG tablet Take 1 tablet (6.25 mg total) by mouth 2 (two) times daily with a meal. 200 tablet 3  . doxazosin (CARDURA) 8 MG tablet Take 1 tablet (8 mg total) by mouth at bedtime. 90 tablet 3  . furosemide (LASIX) 80 MG tablet Take 1 tablet (80 mg total) by mouth daily. 90 tablet 1  . loperamide (IMODIUM) 2 MG capsule Take 1 capsule (2 mg total) by mouth as needed for diarrhea or loose stools. 30 capsule 3  . polycarbophil (FIBERCON) 625 MG tablet Take 1,250 mg by mouth daily.    . potassium chloride SA (KLOR-CON M20) 20 MEQ tablet Take 1 tablet (20 mEq total) by mouth 2 (two) times daily. 180 tablet 1  . Vitamin D, Ergocalciferol, (DRISDOL) 50000 UNITS CAPS capsule Take 50,000 Units by mouth every 7 (seven) days.    . furosemide (LASIX) 40 MG tablet Take 1 tablet (40 mg total) by mouth daily. (Patient not  taking: Reported on 12/29/2014) 30 tablet 6  . gabapentin (NEURONTIN) 300 MG capsule Take 300 mg by mouth daily.    Marland Kitchen HYDROcodone-acetaminophen (NORCO) 5-325 MG per tablet Take 1 tablet by mouth every 6 (six) hours as needed for severe pain. (Patient not taking: Reported on 12/29/2014) 6 tablet 0  . latanoprost (XALATAN) 0.005 % ophthalmic solution      No current facility-administered medications for this encounter.    Physical Findings: The patient is in no acute distress. Patient is alert and oriented.  height is '5\' 8"'$  (1.727 m) and weight is 237 lb (107.502 kg). His oral temperature is 98 F (36.7 C). His blood pressure is 146/76 and his pulse is 63. His respiration is 16. .     Lab Findings: Lab Results  Component Value Date   WBC 5.6 02/20/2014   HGB 11.6* 02/20/2014   HCT 36.6* 02/20/2014   MCV 94.2 02/20/2014   PLT 190 02/20/2014     Radiographic Findings: No results found.  Impression:    The patient is doing very well at this time. He is clinically NED and symptomatically is doing very well. No new complaints. His CEA level was within the normal range in March.  Plan:  The patient will followup in our clinic on a when necessary basis.  The patient was seen today for 10 minutes, with the majority of the time spent counseling the patient on his diagnosis of cancer and coordinating his care.    Jodelle Gross, M.D., Ph.D.

## 2014-12-29 NOTE — Progress Notes (Signed)
[name] here for follow up.  He denies pain and nausea. He reports occasional diarrhea and takes Imodium as needed.  He denies having any rectal bleeding.  He reports having a good appetite and energy level.    BP 146/76 mmHg  Pulse 63  Temp(Src) 98 F (36.7 C) (Oral)  Resp 16  Ht '5\' 8"'$  (1.727 m)  Wt 237 lb (107.502 kg)  BMI 36.04 kg/m2

## 2015-01-26 ENCOUNTER — Encounter: Payer: Self-pay | Admitting: Internal Medicine

## 2015-01-26 DIAGNOSIS — M1711 Unilateral primary osteoarthritis, right knee: Secondary | ICD-10-CM | POA: Diagnosis not present

## 2015-01-26 DIAGNOSIS — M1712 Unilateral primary osteoarthritis, left knee: Secondary | ICD-10-CM | POA: Diagnosis not present

## 2015-01-26 DIAGNOSIS — M5441 Lumbago with sciatica, right side: Secondary | ICD-10-CM | POA: Diagnosis not present

## 2015-02-05 DIAGNOSIS — N184 Chronic kidney disease, stage 4 (severe): Secondary | ICD-10-CM | POA: Diagnosis not present

## 2015-02-05 DIAGNOSIS — N189 Chronic kidney disease, unspecified: Secondary | ICD-10-CM | POA: Diagnosis not present

## 2015-02-05 DIAGNOSIS — N2581 Secondary hyperparathyroidism of renal origin: Secondary | ICD-10-CM | POA: Diagnosis not present

## 2015-02-15 DIAGNOSIS — N2581 Secondary hyperparathyroidism of renal origin: Secondary | ICD-10-CM | POA: Diagnosis not present

## 2015-02-15 DIAGNOSIS — I10 Essential (primary) hypertension: Secondary | ICD-10-CM | POA: Diagnosis not present

## 2015-02-15 DIAGNOSIS — D631 Anemia in chronic kidney disease: Secondary | ICD-10-CM | POA: Diagnosis not present

## 2015-02-15 DIAGNOSIS — N184 Chronic kidney disease, stage 4 (severe): Secondary | ICD-10-CM | POA: Diagnosis not present

## 2015-02-27 DIAGNOSIS — H401212 Low-tension glaucoma, right eye, moderate stage: Secondary | ICD-10-CM | POA: Diagnosis not present

## 2015-02-27 DIAGNOSIS — H401221 Low-tension glaucoma, left eye, mild stage: Secondary | ICD-10-CM | POA: Diagnosis not present

## 2015-02-27 DIAGNOSIS — H40033 Anatomical narrow angle, bilateral: Secondary | ICD-10-CM | POA: Diagnosis not present

## 2015-03-05 DIAGNOSIS — H40033 Anatomical narrow angle, bilateral: Secondary | ICD-10-CM | POA: Diagnosis not present

## 2015-03-05 DIAGNOSIS — H401212 Low-tension glaucoma, right eye, moderate stage: Secondary | ICD-10-CM | POA: Diagnosis not present

## 2015-03-19 DIAGNOSIS — H40032 Anatomical narrow angle, left eye: Secondary | ICD-10-CM | POA: Diagnosis not present

## 2015-03-19 DIAGNOSIS — H401221 Low-tension glaucoma, left eye, mild stage: Secondary | ICD-10-CM | POA: Diagnosis not present

## 2015-03-27 ENCOUNTER — Other Ambulatory Visit: Payer: Medicare Other

## 2015-03-27 DIAGNOSIS — C20 Malignant neoplasm of rectum: Secondary | ICD-10-CM

## 2015-03-27 LAB — CEA: CEA: 0.7 ng/mL (ref 0.0–5.0)

## 2015-03-29 ENCOUNTER — Telehealth: Payer: Self-pay | Admitting: Oncology

## 2015-03-29 ENCOUNTER — Ambulatory Visit (HOSPITAL_BASED_OUTPATIENT_CLINIC_OR_DEPARTMENT_OTHER): Payer: Medicare Other | Admitting: Nurse Practitioner

## 2015-03-29 VITALS — BP 156/72 | HR 65 | Temp 98.8°F | Resp 18 | Ht 68.0 in | Wt 232.8 lb

## 2015-03-29 DIAGNOSIS — C20 Malignant neoplasm of rectum: Secondary | ICD-10-CM

## 2015-03-29 NOTE — Progress Notes (Signed)
  [facility] OFFICE PROGRESS NOTE   Diagnosis:  Rectal cancer  INTERVAL HISTORY:   Jason [name] returns as scheduled. He feels well. No change in bowel habits. No bloody or black stools. No abdominal pain. No nausea/vomiting. He has a good appetite.   Objective:  Vital signs in last 24 hours:  Blood pressure 156/72, pulse 65, temperature 98.8 F (37.1 C), temperature source Oral, resp. rate 18, height '5\' 8"'$  (1.727 m), weight 232 lb 12.8 oz (105.597 kg), SpO2 99 %.    HEENT: No thrush or ulcers. Lymphatics: No palpable cervical, supraclavicular, axillary or inguinal lymph nodes. Resp: Lungs clear bilaterally. Cardio: Regular rate and rhythm.  GI: Abdomen soft and nontender. No hepatomegaly. No mass. Vascular: No leg edema.   Lab Results:  Lab Results  Component Value Date   WBC 5.6 02/20/2014   HGB 11.6* 02/20/2014   HCT 36.6* 02/20/2014   MCV 94.2 02/20/2014   PLT 190 02/20/2014   NEUTROABS 3.9 02/20/2014   03/27/2015 CEA 0.7 Imaging:  No results found.  Medications: I have reviewed the patient's current medications.  Assessment/Plan: 1. Rectal cancer, clinical stage III (uT3, uN2).  Initiation of concurrent Xeloda and radiation on 02/21/2013, completed 03/31/2013.   CEA normal 04/25/2013   Status post laparoscopic low anterior resection with diverting loop ileostomy 05/23/2013. Final pathology showed a 3.2 cm invasive adenocarcinoma with neoadjuvant related change. Tumor invaded into perirectal soft tissue. There was no lymphovascular or perineural invasion. 14 lymph nodes were negative for tumor. Surgical margins were negative. There was one hyperplastic polyp (ypT3, pN0).   Initiation of weekly 5-FU/leucovorin 08/01/2013   Ileostomy takedown 08/24/2013.   Weekly 5-FU/leucovorin resumed 09/22/2013.  Restaging CTs 02/20/2014 negative for evidence of recurrent disease  Surveillance colonoscopy 03/31/2014, status post removal of of a  tubular adenoma from the ascending colon 2. Markedly elevated pretreatment CEA 3. Indeterminate pulmonary/subpleural nodules and right hepatic dome lesion. Unchanged on the CT 04/25/2013, unchanged nodular lung lesions on the CT 02/20/2014 4. Renal insufficiency. Acute on chronic renal failure during hospitalization November/December 2014. Improved following the ileostomy takedown. 5. History of Anemia secondary chemotherapy, chronic disease, and renal insufficiency 6. Hypertension. 7. Gout. 8. Prolonged postoperative ileus following laparoscopic low anterior resection with diverting loop ileostomy 05/23/2013. 9. C. difficile-positive 09/08/2013-status post Flagyl with improvement. Recurrent diarrhea following completion of Flagyl. Stool positive for C. difficile on 09/24/2013. He completed treatment with vancomycin. The diarrhea resolved. 10. Left greater than right low leg edema. Venous Doppler 09/26/2013 negative for left leg DVT. 11. Rectal urgency/incontinence-Improved with participation in the pelvic physical therapy clinic.   Disposition: Jason [name] remains in clinical remission from rectal cancer. Restaging CT scans ordered to be done prior to today's visit have not been done. We will contact radiology to schedule. We will contact him with the results. He will return for follow-up visit and CEA in 6 months. He will contact the office in the interim with any problems.    Ned Card ANP/GNP-BC   03/29/2015  3:26 PM

## 2015-03-29 NOTE — Telephone Encounter (Signed)
per pof to sch pt appt-gave pt copy of avs-CT sch-pt to call for instructions

## 2015-04-05 ENCOUNTER — Ambulatory Visit (HOSPITAL_COMMUNITY)
Admission: RE | Admit: 2015-04-05 | Discharge: 2015-04-05 | Disposition: A | Discharge status: Home / Self Care | Payer: Medicare Other | Source: Ambulatory Visit | Attending: Oncology | Admitting: Oncology

## 2015-04-05 DIAGNOSIS — I700 Atherosclerosis of aorta: Secondary | ICD-10-CM | POA: Insufficient documentation

## 2015-04-05 DIAGNOSIS — R918 Other nonspecific abnormal finding of lung field: Secondary | ICD-10-CM | POA: Diagnosis not present

## 2015-04-05 DIAGNOSIS — C20 Malignant neoplasm of rectum: Secondary | ICD-10-CM | POA: Diagnosis not present

## 2015-04-06 ENCOUNTER — Telehealth: Payer: Self-pay | Admitting: *Deleted

## 2015-04-06 DIAGNOSIS — C20 Malignant neoplasm of rectum: Secondary | ICD-10-CM

## 2015-04-06 NOTE — Telephone Encounter (Signed)
Called pt with CT results: Shows no evidence of cancer, per Dr. Benay Spice. One lung nodule is slightly larger. Repeat non-contrast CT in 4 mos to be sure this is not enlarging. He voiced understanding.

## 2015-04-06 NOTE — Telephone Encounter (Signed)
-----   Message from Jason Pier, MD sent at 04/06/2015  4:06 PM EDT ----- Please call patient, CTs show no evidence of cancer, one lung nodule is slightly larger, repeat noncontrast chest CT in 4 months to be sure this is not enlarging

## 2015-04-10 ENCOUNTER — Telehealth: Payer: Self-pay | Admitting: Family Medicine

## 2015-04-10 NOTE — Telephone Encounter (Signed)
Pt call and said he has a cold and is asking what he can take. Would like a call back   [phone]

## 2015-04-11 ENCOUNTER — Ambulatory Visit: Payer: Medicare Other | Admitting: Adult Health

## 2015-04-11 MED ORDER — HYDROCODONE-HOMATROPINE 5-1.5 MG/5ML PO SYRP: 5.0000 mL | ORAL_SOLUTION | Freq: Three times a day (TID) | ORAL | Status: DC | PRN

## 2015-04-11 NOTE — Telephone Encounter (Signed)
Per Dr Sherren Mocha Rx.  Ready for pick up and patient is aware.

## 2015-04-12 ENCOUNTER — Telehealth: Payer: Self-pay | Admitting: Family Medicine

## 2015-04-12 NOTE — Telephone Encounter (Signed)
Wife states he was told they could schedule their physicals. this year. Please confirm this is OK.  First available with dr todd is Feb 2017

## 2015-04-13 NOTE — Telephone Encounter (Signed)
No workins per Dr Sherren Mocha

## 2015-04-16 ENCOUNTER — Ambulatory Visit (INDEPENDENT_AMBULATORY_CARE_PROVIDER_SITE_OTHER): Payer: Medicare Other | Admitting: Family Medicine

## 2015-04-16 ENCOUNTER — Encounter: Payer: Self-pay | Admitting: Family Medicine

## 2015-04-16 VITALS — BP 148/61 | HR 59 | Temp 100.9°F | Ht 68.0 in | Wt 226.0 lb

## 2015-04-16 DIAGNOSIS — H65191 Other acute nonsuppurative otitis media, right ear: Secondary | ICD-10-CM

## 2015-04-16 DIAGNOSIS — J019 Acute sinusitis, unspecified: Secondary | ICD-10-CM | POA: Diagnosis not present

## 2015-04-16 MED ORDER — HYDROCODONE-HOMATROPINE 5-1.5 MG/5ML PO SYRP: 5.0000 mL | ORAL_SOLUTION | Freq: Three times a day (TID) | ORAL | Status: DC | PRN

## 2015-04-16 MED ORDER — AMOXICILLIN-POT CLAVULANATE 875-125 MG PO TABS: 1.0000 | ORAL_TABLET | Freq: Two times a day (BID) | ORAL | Status: DC

## 2015-04-16 NOTE — Progress Notes (Signed)
   Subjective:    Patient ID: Jason [name], male    DOB: 22-May-1946, 69 y.o.   MRN: [id number]  HPI Here for one week of sinus pressure, PND, ST, fever, and a dry cough. No ear pain.   Review of Systems  Constitutional: Positive for fever.  HENT: Positive for congestion, hearing loss, postnasal drip and sinus pressure. Negative for ear discharge and ear pain.   Eyes: Negative.   Respiratory: Positive for cough.        Objective:   Physical Exam  Constitutional: He appears well-developed and well-nourished.  HENT:  Left Ear: External ear normal.  Nose: Nose normal.  Mouth/Throat: Oropharynx is clear and moist.  Right TM is dull, red, and bulging   Eyes: Conjunctivae are normal.  Pulmonary/Chest: Effort normal and breath sounds normal.  Lymphadenopathy:    He has no cervical adenopathy.          Assessment & Plan:  Sinusitis with a right otitis media. Treat with Augmentin.

## 2015-04-16 NOTE — Progress Notes (Signed)
Pre visit review using our clinic review tool, if applicable. No additional management support is needed unless otherwise documented below in the visit note. 

## 2015-04-27 ENCOUNTER — Encounter: Payer: Self-pay | Admitting: Family Medicine

## 2015-04-27 ENCOUNTER — Ambulatory Visit (INDEPENDENT_AMBULATORY_CARE_PROVIDER_SITE_OTHER): Payer: Medicare Other | Admitting: Family Medicine

## 2015-04-27 VITALS — BP 140/90 | HR 78 | Temp 98.3°F | Wt 224.0 lb

## 2015-04-27 DIAGNOSIS — H65191 Other acute nonsuppurative otitis media, right ear: Secondary | ICD-10-CM | POA: Diagnosis not present

## 2015-04-27 DIAGNOSIS — Z23 Encounter for immunization: Secondary | ICD-10-CM | POA: Diagnosis not present

## 2015-04-27 NOTE — Addendum Note (Signed)
Addended by: Westley Hummer B on: 04/27/2015 03:52 PM   Modules accepted: Orders

## 2015-04-27 NOTE — Progress Notes (Signed)
   Subjective:    Patient ID: Jason [name], male    DOB: [birth year], 69 y.o.   MRN: [id number]  HPI Here to recheck his right ear. He was here 11 days ago for URI symptoms like coughing and a ST, and we diagnosed him with a right otitis media. He has finished a 10 days course of Augmentin and all these other symptoms have resolved. He has no pain in the right ear but there is still some pressure and decreased hearing.    Review of Systems  Constitutional: Negative.   HENT: Positive for hearing loss. Negative for congestion, ear discharge, ear pain, facial swelling, postnasal drip and sore throat.   Eyes: Negative.   Respiratory: Positive for cough.        Objective:   Physical Exam  Constitutional: He appears well-developed and well-nourished. No distress.  HENT:  Left Ear: External ear normal.  Nose: Nose normal.  Mouth/Throat: Oropharynx is clear and moist.  Right TM is dull but no longer bulging and no longer red   Eyes: Conjunctivae are normal.  Neck: No thyromegaly present.  Pulmonary/Chest: Effort normal and breath sounds normal.  Lymphadenopathy:    He has no cervical adenopathy.          Assessment & Plan:  His otitis has resolved but he has a small residual effusion. Try Mucinex 1200 mg bid for a few days. Recheck prn

## 2015-04-27 NOTE — Progress Notes (Signed)
Pre visit review using our clinic review tool, if applicable. No additional management support is needed unless otherwise documented below in the visit note. 

## 2015-05-14 DIAGNOSIS — H401221 Low-tension glaucoma, left eye, mild stage: Secondary | ICD-10-CM | POA: Diagnosis not present

## 2015-05-14 DIAGNOSIS — H40032 Anatomical narrow angle, left eye: Secondary | ICD-10-CM | POA: Diagnosis not present

## 2015-05-17 NOTE — Telephone Encounter (Signed)
Pt has no voice mail set up and unable to leave message

## 2015-05-30 DIAGNOSIS — H401212 Low-tension glaucoma, right eye, moderate stage: Secondary | ICD-10-CM | POA: Diagnosis not present

## 2015-06-04 ENCOUNTER — Ambulatory Visit (INDEPENDENT_AMBULATORY_CARE_PROVIDER_SITE_OTHER): Payer: Medicare Other | Admitting: Family

## 2015-06-04 ENCOUNTER — Other Ambulatory Visit (INDEPENDENT_AMBULATORY_CARE_PROVIDER_SITE_OTHER): Payer: Medicare Other

## 2015-06-04 ENCOUNTER — Telehealth: Payer: Self-pay | Admitting: Family

## 2015-06-04 ENCOUNTER — Encounter: Payer: Self-pay | Admitting: Family

## 2015-06-04 VITALS — BP 142/84 | HR 83 | Temp 98.4°F | Resp 18 | Ht 68.0 in | Wt 232.0 lb

## 2015-06-04 DIAGNOSIS — I10 Essential (primary) hypertension: Secondary | ICD-10-CM

## 2015-06-04 DIAGNOSIS — R7989 Other specified abnormal findings of blood chemistry: Secondary | ICD-10-CM | POA: Diagnosis not present

## 2015-06-04 DIAGNOSIS — Z0000 Encounter for general adult medical examination without abnormal findings: Secondary | ICD-10-CM

## 2015-06-04 DIAGNOSIS — M25571 Pain in right ankle and joints of right foot: Secondary | ICD-10-CM | POA: Diagnosis not present

## 2015-06-04 DIAGNOSIS — G5752 Tarsal tunnel syndrome, left lower limb: Secondary | ICD-10-CM | POA: Diagnosis not present

## 2015-06-04 DIAGNOSIS — M25572 Pain in left ankle and joints of left foot: Secondary | ICD-10-CM | POA: Diagnosis not present

## 2015-06-04 DIAGNOSIS — Z23 Encounter for immunization: Secondary | ICD-10-CM | POA: Diagnosis not present

## 2015-06-04 LAB — COMPREHENSIVE METABOLIC PANEL
ALBUMIN: 3.9 g/dL (ref 3.5–5.2)
ALT: 7 U/L (ref 0–53)
AST: 11 U/L (ref 0–37)
Alkaline Phosphatase: 79 U/L (ref 39–117)
BILIRUBIN TOTAL: 0.5 mg/dL (ref 0.2–1.2)
BUN: 31 mg/dL — ABNORMAL HIGH (ref 6–23)
CALCIUM: 10.2 mg/dL (ref 8.4–10.5)
CO2: 31 meq/L (ref 19–32)
CREATININE: 2.98 mg/dL — AB (ref 0.40–1.50)
Chloride: 100 mEq/L (ref 96–112)
GFR: 27.06 mL/min — ABNORMAL LOW (ref >60.00)
Glucose, Bld: 110 mg/dL — ABNORMAL HIGH (ref 70–99)
Potassium: 4 mEq/L (ref 3.5–5.1)
Sodium: 140 mEq/L (ref 135–145)
TOTAL PROTEIN: 7 g/dL (ref 6.0–8.3)

## 2015-06-04 LAB — CBC
HCT: 33.9 % — ABNORMAL LOW (ref 39.0–52.0)
Hemoglobin: 10.6 g/dL — ABNORMAL LOW (ref 13.0–17.0)
MCHC: 31.1 g/dL (ref 30.0–36.0)
MCV: 85.8 fl (ref 78.0–100.0)
PLATELETS: 242 10*3/uL (ref 150.0–400.0)
RBC: 3.95 Mil/uL — AB (ref 4.22–5.81)
RDW: 18 % — ABNORMAL HIGH (ref 11.5–15.5)
WBC: 7.6 10*3/uL (ref 4.0–10.5)

## 2015-06-04 LAB — LIPID PANEL
CHOL/HDL RATIO: 6
CHOLESTEROL: 208 mg/dL — AB (ref 0–200)
HDL: 34.8 mg/dL — ABNORMAL LOW (ref >39.00)
NonHDL: 172.97
Triglycerides: 203 mg/dL — ABNORMAL HIGH (ref 0.0–149.0)
VLDL: 40.6 mg/dL — ABNORMAL HIGH (ref 0.0–40.0)

## 2015-06-04 LAB — TSH: TSH: 3.45 u[IU]/mL (ref 0.35–4.50)

## 2015-06-04 LAB — LDL CHOLESTEROL, DIRECT: Direct LDL: 120 mg/dL

## 2015-06-04 NOTE — Progress Notes (Signed)
Pre visit review using our clinic review tool, if applicable. No additional management support is needed unless otherwise documented below in the visit note. 

## 2015-06-04 NOTE — Progress Notes (Signed)
Subjective:    Patient ID: Jason [name], male    DOB: 07/27/1945, 69 y.o.   MRN: [id number]  Chief Complaint  Patient presents with  . CPE    Not fasting    HPI:  Jason [name] is a 69 y.o. male who presents today for an annual wellness visit.   1) Health Maintenance -   Diet - Regular diet eating about 3 meals per day consisting of beef, chicken, fish, fruits and vegetables. Denies caffeine intake.   Exercise - Little bit of walking - no regular or structured exercise.   2) Preventative Exams / Immunizations:  Dental -- Due for exam  Vision -- Up to date   Health Maintenance  Topic Date Due  . Hepatitis C Screening  1945/10/26  . ZOSTAVAX  11/06/2006  . PNA vac Low Risk Adult (2 of 2 - PCV13) 08/26/2014  . INFLUENZA VACCINE  02/05/2016  . TETANUS/TDAP  04/27/2018  . COLONOSCOPY  04/01/2019     Immunization History  Administered Date(s) Administered  . Influenza Whole 05/15/2009, 06/05/2010  . Influenza, High Dose Seasonal PF 04/27/2015  . Influenza,inj,Quad PF,36+ Mos 07/14/2013, 04/10/2014  . Pneumococcal Polysaccharide-23 07/20/2012, 08/26/2013  . Td 07/07/1997, 04/27/2008     RISK FACTORS  Tobacco History  Smoking status  . Former Smoker -- 0.50 packs/day for 10 years  . Quit date: 07/07/1977  Smokeless tobacco  . Never Used     Cardiac risk factors: advanced age (older than 58 for men, 40 for women), hypertension, male gender, obesity (BMI >= 30 kg/m2) and sedentary lifestyle.  Depression Screen  Q1: Over the past two weeks, have you felt down, depressed or hopeless? No  Q2: Over the past two weeks, have you felt little interest or pleasure in doing things? No  Have you lost interest or pleasure in daily life? No  Do you often feel hopeless? No  Do you cry easily over simple problems? No  Depression screen PHQ 2/9 06/04/2015  Decreased Interest 0  Down, Depressed, Hopeless 0  PHQ - 2 Score 0    Activities of Daily Living In your  present state of health, do you have any difficulty performing the following activities?:  Driving? No Managing money?  No Feeding yourself? No Getting from bed to chair? No Climbing a flight of stairs? No Preparing food and eating?: No Bathing or showering? No Getting dressed: No Getting to the toilet? No Using the toilet: No Moving around from place to place: No In the past year have you fallen or had a near fall?:No   Home Safety Has smoke detector and wears seat belts. No excess sun exposure. Are there smokers in your home (other than you)?  No Do you feel safe at home?  Yes  Hearing Difficulties: No Do you often ask people to speak up or repeat themselves? No Do you experience ringing or noises in your ears? No  Do you have difficulty understanding soft or whispered voices? No    Cognitive Testing  Alert? Yes   Normal Appearance? Yes  Oriented to person? Yes  Place? Yes   Time? Yes  Recall of three objects?  Yes  Can perform simple calculations? Yes  Displays appropriate judgment? Yes  Can read the correct time from a watch face? Yes  Do you feel that you have a problem with memory? No  Do you often misplace items? No   Advanced Directives have been discussed with the patient? Does have living will  last updated about 2 years ago.   Current Physicians/Providers and Suppliers  1. Terri Piedra, FNP - Primary Care 2. Kyung Rudd, MD - Oncology 3. Betsy Coder, MD -Oncology 4. Ned Card, NP - Oncology   Indicate any recent Medical Services you may have received from other than [facility] providers in the past year (date may be approximate).  All answers were reviewed with the patient and necessary referrals were made:  Mauricio Po, FNP   06/04/2015   Allergies  Allergen Reactions  . Nsaids     Asked by surgeon to add this medication class as intolerance due to patients renal insufficiency.     Outpatient Prescriptions Prior to Visit  Medication Sig Dispense  Refill  . acetaminophen (TYLENOL) 325 MG tablet Take 2 tablets (650 mg total) by mouth every 4 (four) hours as needed for moderate pain.    Marland Kitchen allopurinol (ZYLOPRIM) 300 MG tablet Take 1 tablet (300 mg total) by mouth every morning. 45 tablet 5  . carvedilol (COREG) 6.25 MG tablet Take 1 tablet (6.25 mg total) by mouth 2 (two) times daily with a meal. 200 tablet 3  . doxazosin (CARDURA) 8 MG tablet Take 1 tablet (8 mg total) by mouth at bedtime. 90 tablet 3  . furosemide (LASIX) 80 MG tablet Take 1 tablet (80 mg total) by mouth daily. 90 tablet 1  . gabapentin (NEURONTIN) 300 MG capsule Take 300 mg by mouth daily.    Marland Kitchen loperamide (IMODIUM) 2 MG capsule Take 1 capsule (2 mg total) by mouth as needed for diarrhea or loose stools. 30 capsule 3  . polycarbophil (FIBERCON) 625 MG tablet Take 1,250 mg by mouth daily.    . Vitamin D, Ergocalciferol, (DRISDOL) 50000 UNITS CAPS capsule Take 50,000 Units by mouth every 7 (seven) days.    Marland Kitchen HYDROcodone-acetaminophen (NORCO) 5-325 MG per tablet Take 1 tablet by mouth every 6 (six) hours as needed for severe pain. 6 tablet 0  . HYDROcodone-homatropine (HYCODAN) 5-1.5 MG/5ML syrup Take 5 mLs by mouth every 8 (eight) hours as needed. 120 mL 0  . latanoprost (XALATAN) 0.005 % ophthalmic solution     . amoxicillin-clavulanate (AUGMENTIN) 875-125 MG tablet Take 1 tablet by mouth 2 (two) times daily. 20 tablet 0  . potassium chloride SA (KLOR-CON M20) 20 MEQ tablet Take 1 tablet (20 mEq total) by mouth 2 (two) times daily. 180 tablet 1   No facility-administered medications prior to visit.     Past Medical History  Diagnosis Date  . Gout   . Hypertension   . Hyperplastic colon polyp   . Diverticulosis   . Anemia   . Arthritis   . ED (erectile dysfunction)   . Chronic renal insufficiency, stage II (mild)   . History of radiation therapy 02/21/13-03/31/13    rectum 50.4Gy total dose  . rectal ca dx'd 02/01/13    rectal. Radiation and chemotherapy- remains on  chemotherapy-next tx. 08-22-13.      Past Surgical History  Procedure Laterality Date  . Colonoscopy  2010    [facility]  . Eus N/A 02/03/2013    Procedure: LOWER ENDOSCOPIC ULTRASOUND (EUS);  Surgeon: Milus Banister, MD;  Location: [facility];  Service: Endoscopy;  Laterality: N/A;  . Laparoscopic low anterior resection N/A 05/20/2013    Procedure: LAPAROSCOPIC LOW ANTERIOR RESECTION WITH SPLENIC FLEXURE MOBILIZATION;  Surgeon: Leighton Ruff, MD;  Location: [facility];  Service: General;  Laterality: N/A;  . Ostomy N/A 05/20/2013    Procedure: diverting OSTOMY;  Surgeon: Leighton Ruff, MD;  Location: [facility];  Service: General;  Laterality: N/A;  . Colon surgery  05/20/2013  . Ileostomy closure N/A 08/24/2013    Procedure: CLOSURE OF LOOP ILEOSTOMY ;  Surgeon: Leighton Ruff, MD;  Location: [facility];  Service: General;  Laterality: N/A;     Family History  Problem Relation Age of Onset  . Hypertension Other   . Stroke Mother   . Hypertension Mother   . Stroke Father   . Hypertension Father   . Colon cancer Neg Hx      Social History   Social History  . Marital Status: Married    Spouse Name: [name]  . Number of Children: 2  . Years of Education: 12   Occupational History  .      retired Insurance account manager   Social History Main Topics  . Smoking status: Former Smoker -- 0.50 packs/day for 10 years    Quit date: 07/07/1977  . Smokeless tobacco: Never Used  . Alcohol Use: No     Comment: pint of whisky every 2 weeks and occasional beer until about a month ago-Quit 10'14   . Drug Use: No  . Sexual Activity: Not on file   Other Topics Concern  . Not on file   Social History Narrative   Married to wife, [name]   Retired Insurance account manager w/[employer]   #2 grown children and #6 grandchildren      Review of Systems  Constitutional: Denies fever, chills, fatigue, or significant weight gain/loss. HENT: Head: Denies headache or neck pain Ears:  Denies changes in hearing, ringing in ears, earache, drainage Nose: Denies discharge, stuffiness, itching, nosebleed, sinus pain Throat: Denies sore throat, hoarseness, dry mouth, sores, thrush Eyes: Denies loss/changes in vision, pain, redness, blurry/double vision, flashing lights Cardiovascular: Denies chest pain/discomfort, tightness, palpitations, shortness of breath with activity, difficulty lying down, swelling, sudden awakening with shortness of breath Respiratory: Denies shortness of breath, cough, sputum production, wheezing Gastrointestinal: Denies dysphasia, heartburn, change in appetite, nausea, change in bowel habits, rectal bleeding, constipation, diarrhea, yellow skin or eyes Genitourinary: Denies frequency, urgency, burning/pain, blood in urine, incontinence, change in urinary strength. Musculoskeletal: Denies muscle/joint pain, stiffness, back pain, redness or swelling of joints, trauma Skin: Denies rashes, lumps, itching, dryness, color changes, or hair/nail changes Neurological: Denies dizziness, fainting, seizures, weakness, numbness, tingling, tremor Psychiatric - Denies nervousness, stress, depression or memory loss Endocrine: Denies heat or cold intolerance, sweating, frequent urination, excessive thirst, changes in appetite Hematologic: Denies ease of bruising or bleeding    Objective:     BP 142/84 mmHg  Pulse 83  Temp(Src) 98.4 F (36.9 C) (Oral)  Resp 18  Ht '5\' 8"'$  (1.727 m)  Wt 232 lb (105.235 kg)  BMI 35.28 kg/m2  SpO2 97% Nursing note and vital signs reviewed.  Physical Exam  Constitutional: He is oriented to person, place, and time. He appears well-developed and well-nourished.  HENT:  Head: Normocephalic.  Right Ear: Hearing, tympanic membrane, external ear and ear canal normal.  Left Ear: Hearing, tympanic membrane, external ear and ear canal normal.  Nose: Nose normal.  Mouth/Throat: Uvula is midline, oropharynx is clear and moist and mucous  membranes are normal.  Eyes: Conjunctivae and EOM are normal. Pupils are equal, round, and reactive to light.  Neck: Neck supple. No JVD present. No tracheal deviation present. No thyromegaly present.  Cardiovascular: Normal rate, regular rhythm, normal heart sounds and intact distal pulses.  Pulmonary/Chest: Effort normal and breath sounds normal.  Abdominal: Soft. Bowel sounds are normal. He exhibits no distension and no mass. There is no tenderness. There is no rebound and no guarding.  Musculoskeletal: Normal range of motion. He exhibits no edema or tenderness.  Lymphadenopathy:    He has no cervical adenopathy.  Neurological: He is alert and oriented to person, place, and time. He has normal reflexes. No cranial nerve deficit. He exhibits normal muscle tone. Coordination normal.  Skin: Skin is warm and dry.  Psychiatric: He has a normal mood and affect. His behavior is normal. Judgment and thought content normal.       Assessment & Plan:   During the course of the visit the patient was educated and counseled about appropriate screening and preventive services including:    Pneumococcal vaccine   Influenza vaccine  Prostate cancer screening  Colorectal cancer screening  Nutrition counseling   Diet review for nutrition referral? Yes ____  Not Indicated _X___   Patient Instructions (the written plan) was given to the patient.  Medicare Attestation I have personally reviewed: The patient's medical and social history Their use of alcohol, tobacco or illicit drugs Their current medications and supplements The patient's functional ability including ADLs,fall risks, home safety risks, cognitive, and hearing and visual impairment Diet and physical activities Evidence for depression or mood disorders  The patient's weight, height, BMI,  have been recorded in the chart.  I have made referrals, counseling, and provided education to the patient based on review of the above and I  have provided the patient with a written personalized care plan for preventive services.     Mauricio Po, FNP   06/04/2015    Problem List Items Addressed This Visit      Other   Routine general medical examination at a health care facility    1) Anticipatory Guidance: Discussed importance of wearing a seatbelt while driving and not texting while driving; changing batteries in smoke detector at least once annually; wearing suntan lotion when outside; eating a balanced and moderate diet; getting physical activity at least 30 minutes per day.  2) Immunizations / Screenings / Labs:  Updated Prevnar today. All other immunizations are up-to-date per recommendations. Due for a dental screen which will be completed independently. All other screenings are up to date per recommendations. Obtain CBC, CMET, Lipid profile and TSH.   Overall well exam with risk factors for cardiovascular disease including hypertension, obesity, obstructive sleep apnea and sedentary lifestyle. Discussed importance of improving nutrient intake consuming a varied and moderate diet emphasizing nutrient dense foods that are low in saturated/transfer fats. Counseled on increasing physical activity to 30 minutes daily including aquatic therapy if necessary. Lifestyle changes are likely to improve hypertension is well. Follow-up prevention exam in 1 year. Follow-up office visit pending blood work.       Relevant Orders   CBC   Comprehensive metabolic panel   TSH   Lipid panel   Medicare annual wellness visit, subsequent - Primary    Reviewed and updated patient's medical, surgical, family and social history. Medications and allergies were also reviewed. Basic screenings for depression, activities of daily living, hearing, cognition and safety were performed. Provider list was updated and health plan was provided to the patient.

## 2015-06-04 NOTE — Assessment & Plan Note (Signed)
1) Anticipatory Guidance: Discussed importance of wearing a seatbelt while driving and not texting while driving; changing batteries in smoke detector at least once annually; wearing suntan lotion when outside; eating a balanced and moderate diet; getting physical activity at least 30 minutes per day.  2) Immunizations / Screenings / Labs:  Updated Prevnar today. All other immunizations are up-to-date per recommendations. Due for a dental screen which will be completed independently. All other screenings are up to date per recommendations. Obtain CBC, CMET, Lipid profile and TSH.   Overall well exam with risk factors for cardiovascular disease including hypertension, obesity, obstructive sleep apnea and sedentary lifestyle. Discussed importance of improving nutrient intake consuming a varied and moderate diet emphasizing nutrient dense foods that are low in saturated/transfer fats. Counseled on increasing physical activity to 30 minutes daily including aquatic therapy if necessary. Lifestyle changes are likely to improve hypertension is well. Follow-up prevention exam in 1 year. Follow-up office visit pending blood work.

## 2015-06-04 NOTE — Telephone Encounter (Signed)
Please inform patient that his thyroid function, liver function and electrolytes are within the normal limits. He continues to have lower levels of kidney function fairly consistent with his previous blood work. Please determine if he has a nephrologist or kidney doctor. His white/red blood cells show an anemia most likely related to his kidney disease. Please plan to follow up in about 3 months. If he does not have a kidney doctor we will refer him to establish.

## 2015-06-04 NOTE — Assessment & Plan Note (Signed)
Reviewed and updated patient's medical, surgical, family and social history. Medications and allergies were also reviewed. Basic screenings for depression, activities of daily living, hearing, cognition and safety were performed. Provider list was updated and health plan was provided to the patient.  

## 2015-06-04 NOTE — Patient Instructions (Signed)
Thank you for choosing [facility].  Summary/Instructions:  Please contact [facility] regarding water therapy/exercise.  Health Maintenance  Topic Date Due  . Hepatitis C Screening  09/30/1945  . ZOSTAVAX  11/06/2006  . PNA vac Low Risk Adult (2 of 2 - PCV13) 08/26/2014  . INFLUENZA VACCINE  02/05/2016  . TETANUS/TDAP  04/27/2018  . COLONOSCOPY  04/01/2019   Health Maintenance, Male A healthy lifestyle and preventative care can promote health and wellness.  Maintain regular health, dental, and eye exams.  Eat a healthy diet. Foods like vegetables, fruits, whole grains, low-fat dairy products, and lean protein foods contain the nutrients you need and are low in calories. Decrease your intake of foods high in solid fats, added sugars, and salt. Get information about a proper diet from your health care provider, if necessary.  Regular physical exercise is one of the most important things you can do for your health. Most adults should get at least 150 minutes of moderate-intensity exercise (any activity that increases your heart rate and causes you to sweat) each week. In addition, most adults need muscle-strengthening exercises on 2 or more days a week.   Maintain a healthy weight. The body mass index (BMI) is a screening tool to identify possible weight problems. It provides an estimate of body fat based on height and weight. Your health care provider can find your BMI and can help you achieve or maintain a healthy weight. For males 20 years and older:  A BMI below 18.5 is considered underweight.  A BMI of 18.5 to 24.9 is normal.  A BMI of 25 to 29.9 is considered overweight.  A BMI of 30 and above is considered obese.  Maintain normal blood lipids and cholesterol by exercising and minimizing your intake of saturated fat. Eat a balanced diet with plenty of fruits and vegetables. Blood tests for lipids and cholesterol should begin at age 56 and be repeated every 5  years. If your lipid or cholesterol levels are high, you are over age 39, or you are at high risk for heart disease, you may need your cholesterol levels checked more frequently.Ongoing high lipid and cholesterol levels should be treated with medicines if diet and exercise are not working.  If you smoke, find out from your health care provider how to quit. If you do not use tobacco, do not start.  Lung cancer screening is recommended for adults aged 16-80 years who are at high risk for developing lung cancer because of a history of smoking. A yearly low-dose CT scan of the lungs is recommended for people who have at least a 30-pack-year history of smoking and are current smokers or have quit within the past 15 years. A pack year of smoking is smoking an average of 1 pack of cigarettes a day for 1 year (for example, a 30-pack-year history of smoking could mean smoking 1 pack a day for 30 years or 2 packs a day for 15 years). Yearly screening should continue until the smoker has stopped smoking for at least 15 years. Yearly screening should be stopped for people who develop a health problem that would prevent them from having lung cancer treatment.  If you choose to drink alcohol, do not have more than 2 drinks per day. One drink is considered to be 12 oz (360 mL) of beer, 5 oz (150 mL) of wine, or 1.5 oz (45 mL) of liquor.  Avoid the use of street drugs. Do not share needles with anyone.  Ask for help if you need support or instructions about stopping the use of drugs.  High blood pressure causes heart disease and increases the risk of stroke. High blood pressure is more likely to develop in:  People who have blood pressure in the end of the normal range (100-139/85-89 mm Hg).  People who are overweight or obese.  People who are African American.  If you are 3-48 years of age, have your blood pressure checked every 3-5 years. If you are 35 years of age or older, have your blood pressure checked  every year. You should have your blood pressure measured twice--once when you are at a hospital or clinic, and once when you are not at a hospital or clinic. Record the average of the two measurements. To check your blood pressure when you are not at a hospital or clinic, you can use:  An automated blood pressure machine at a pharmacy.  A home blood pressure monitor.  If you are 14-36 years old, ask your health care provider if you should take aspirin to prevent heart disease.  Diabetes screening involves taking a blood sample to check your fasting blood sugar level. This should be done once every 3 years after age 31 if you are at a normal weight and without risk factors for diabetes. Testing should be considered at a younger age or be carried out more frequently if you are overweight and have at least 1 risk factor for diabetes.  Colorectal cancer can be detected and often prevented. Most routine colorectal cancer screening begins at the age of 52 and continues through age 64. However, your health care provider may recommend screening at an earlier age if you have risk factors for colon cancer. On a yearly basis, your health care provider may provide home test kits to check for hidden blood in the stool. A small camera at the end of a tube may be used to directly examine the colon (sigmoidoscopy or colonoscopy) to detect the earliest forms of colorectal cancer. Talk to your health care provider about this at age 52 when routine screening begins. A direct exam of the colon should be repeated every 5-10 years through age 21, unless early forms of precancerous polyps or small growths are found.  People who are at an increased risk for hepatitis B should be screened for this virus. You are considered at high risk for hepatitis B if:  You were born in a country where hepatitis B occurs often. Talk with your health care provider about which countries are considered high risk.  Your parents were born in a  high-risk country and you have not received a shot to protect against hepatitis B (hepatitis B vaccine).  You have HIV or AIDS.  You use needles to inject street drugs.  You live with, or have sex with, someone who has hepatitis B.  You are a man who has sex with other men (MSM).  You get hemodialysis treatment.  You take certain medicines for conditions like cancer, organ transplantation, and autoimmune conditions.  Hepatitis C blood testing is recommended for all people born from [birth year] through 1964 and any individual with known risk factors for hepatitis C.  Healthy men should no longer receive prostate-specific antigen (PSA) blood tests as part of routine cancer screening. Talk to your health care provider about prostate cancer screening.  Testicular cancer screening is not recommended for adolescents or adult males who have no symptoms. Screening includes self-exam, a health care provider exam, and  other screening tests. Consult with your health care provider about any symptoms you have or any concerns you have about testicular cancer.  Practice safe sex. Use condoms and avoid high-risk sexual practices to reduce the spread of sexually transmitted infections (STIs).  You should be screened for STIs, including gonorrhea and chlamydia if:  You are sexually active and are younger than 24 years.  You are older than 24 years, and your health care provider tells you that you are at risk for this type of infection.  Your sexual activity has changed since you were last screened, and you are at an increased risk for chlamydia or gonorrhea. Ask your health care provider if you are at risk.  If you are at risk of being infected with HIV, it is recommended that you take a prescription medicine daily to prevent HIV infection. This is called pre-exposure prophylaxis (PrEP). You are considered at risk if:  You are a man who has sex with other men (MSM).  You are a heterosexual man who is  sexually active with multiple partners.  You take drugs by injection.  You are sexually active with a partner who has HIV.  Talk with your health care provider about whether you are at high risk of being infected with HIV. If you choose to begin PrEP, you should first be tested for HIV. You should then be tested every 3 months for as long as you are taking PrEP.  Use sunscreen. Apply sunscreen liberally and repeatedly throughout the day. You should seek shade when your shadow is shorter than you. Protect yourself by wearing long sleeves, pants, a wide-brimmed hat, and sunglasses year round whenever you are outdoors.  Tell your health care provider of new moles or changes in moles, especially if there is a change in shape or color. Also, tell your health care provider if a mole is larger than the size of a pencil eraser.  A one-time screening for abdominal aortic aneurysm (AAA) and surgical repair of large AAAs by ultrasound is recommended for men aged 35-75 years who are current or former smokers.  Stay current with your vaccines (immunizations).   This information is not intended to replace advice given to you by your health care provider. Make sure you discuss any questions you have with your health care provider.   Document Released: 12/20/2007 Document Revised: 07/14/2014 Document Reviewed: 11/18/2010 Elsevier Interactive Patient Education [employer].

## 2015-06-05 NOTE — Telephone Encounter (Signed)
Pt aware of results 

## 2015-06-11 DIAGNOSIS — N189 Chronic kidney disease, unspecified: Secondary | ICD-10-CM | POA: Diagnosis not present

## 2015-06-11 DIAGNOSIS — N184 Chronic kidney disease, stage 4 (severe): Secondary | ICD-10-CM | POA: Diagnosis not present

## 2015-06-11 DIAGNOSIS — N2581 Secondary hyperparathyroidism of renal origin: Secondary | ICD-10-CM | POA: Diagnosis not present

## 2015-06-13 DIAGNOSIS — H401221 Low-tension glaucoma, left eye, mild stage: Secondary | ICD-10-CM | POA: Diagnosis not present

## 2015-06-18 DIAGNOSIS — G5751 Tarsal tunnel syndrome, right lower limb: Secondary | ICD-10-CM | POA: Diagnosis not present

## 2015-06-18 DIAGNOSIS — M71571 Other bursitis, not elsewhere classified, right ankle and foot: Secondary | ICD-10-CM | POA: Diagnosis not present

## 2015-06-19 ENCOUNTER — Telehealth: Payer: Self-pay | Admitting: *Deleted

## 2015-06-19 DIAGNOSIS — N2581 Secondary hyperparathyroidism of renal origin: Secondary | ICD-10-CM | POA: Diagnosis not present

## 2015-06-19 DIAGNOSIS — N184 Chronic kidney disease, stage 4 (severe): Secondary | ICD-10-CM | POA: Diagnosis not present

## 2015-06-19 DIAGNOSIS — C20 Malignant neoplasm of rectum: Secondary | ICD-10-CM

## 2015-06-19 DIAGNOSIS — I10 Essential (primary) hypertension: Secondary | ICD-10-CM | POA: Diagnosis not present

## 2015-06-19 DIAGNOSIS — D631 Anemia in chronic kidney disease: Secondary | ICD-10-CM | POA: Diagnosis not present

## 2015-06-19 NOTE — Telephone Encounter (Signed)
Called pt to inform him of plan for chest Xray in Jan. Following up lung nodules. He understands to expect a call from radiology schedulers.

## 2015-06-19 NOTE — Telephone Encounter (Signed)
-----   Message from [name], MD sent at 06/18/2015  9:09 PM EST ----- Please call patient, radiology recommends f/u chest CT to be sure on of his lung nodules is not enlarging Schedule non contrast chest CT first week of Jan. F/u office as scheduled, lung nodules are likely benign

## 2015-07-05 DIAGNOSIS — M792 Neuralgia and neuritis, unspecified: Secondary | ICD-10-CM | POA: Diagnosis not present

## 2015-07-12 ENCOUNTER — Ambulatory Visit (HOSPITAL_COMMUNITY)
Admission: RE | Admit: 2015-07-12 | Discharge: 2015-07-12 | Disposition: A | Discharge status: Home / Self Care | Payer: Medicare Other | Source: Ambulatory Visit | Attending: Oncology | Admitting: Oncology

## 2015-07-12 ENCOUNTER — Encounter (HOSPITAL_COMMUNITY): Payer: Self-pay

## 2015-07-12 DIAGNOSIS — R918 Other nonspecific abnormal finding of lung field: Secondary | ICD-10-CM | POA: Diagnosis not present

## 2015-07-12 DIAGNOSIS — Z85048 Personal history of other malignant neoplasm of rectum, rectosigmoid junction, and anus: Secondary | ICD-10-CM | POA: Diagnosis not present

## 2015-07-12 DIAGNOSIS — Z9221 Personal history of antineoplastic chemotherapy: Secondary | ICD-10-CM | POA: Diagnosis not present

## 2015-07-12 DIAGNOSIS — R911 Solitary pulmonary nodule: Secondary | ICD-10-CM | POA: Insufficient documentation

## 2015-07-12 DIAGNOSIS — C20 Malignant neoplasm of rectum: Secondary | ICD-10-CM

## 2015-07-13 ENCOUNTER — Telehealth: Payer: Self-pay | Admitting: *Deleted

## 2015-07-13 NOTE — Telephone Encounter (Signed)
-----   Message from [name], MD sent at 07/12/2015  7:46 PM EST ----- Please call patient, one of the small pleural nodules is slightly increased in size, likely benign, will discuss next visit and consider a PET scan after next visit

## 2015-07-13 NOTE — Telephone Encounter (Signed)
Called pt with CT results. Small pleural nodule is slightly increased in size. Likely benign. Will discuss at next visit and consider a PET then. Pt voiced understanding.

## 2015-07-14 DIAGNOSIS — G4733 Obstructive sleep apnea (adult) (pediatric): Secondary | ICD-10-CM | POA: Diagnosis not present

## 2015-07-20 ENCOUNTER — Other Ambulatory Visit: Payer: Self-pay | Admitting: Family Medicine

## 2015-08-02 DIAGNOSIS — H401212 Low-tension glaucoma, right eye, moderate stage: Secondary | ICD-10-CM | POA: Diagnosis not present

## 2015-08-02 DIAGNOSIS — H40033 Anatomical narrow angle, bilateral: Secondary | ICD-10-CM | POA: Diagnosis not present

## 2015-08-02 DIAGNOSIS — H401221 Low-tension glaucoma, left eye, mild stage: Secondary | ICD-10-CM | POA: Diagnosis not present

## 2015-08-06 ENCOUNTER — Telehealth: Payer: Self-pay | Admitting: Family

## 2015-08-06 ENCOUNTER — Telehealth: Payer: Self-pay | Admitting: *Deleted

## 2015-08-06 DIAGNOSIS — G5752 Tarsal tunnel syndrome, left lower limb: Secondary | ICD-10-CM | POA: Diagnosis not present

## 2015-08-06 DIAGNOSIS — M722 Plantar fascial fibromatosis: Secondary | ICD-10-CM | POA: Diagnosis not present

## 2015-08-06 NOTE — Telephone Encounter (Signed)
Patient would like to know if he can transfer from [place] to [place].  Please advise.

## 2015-08-06 NOTE — Telephone Encounter (Signed)
Ok with me 

## 2015-08-06 NOTE — Telephone Encounter (Signed)
Got patient scheduled

## 2015-08-06 NOTE — Telephone Encounter (Signed)
"  Do you have an appointment for me?"  Next scheduled F/U information provided.  February 23-2017 lab 3:00 pm/MD F/U 3:30 pm.  No further questions.

## 2015-08-10 ENCOUNTER — Other Ambulatory Visit: Payer: Self-pay | Admitting: Family Medicine

## 2015-08-11 ENCOUNTER — Other Ambulatory Visit: Payer: Self-pay | Admitting: Family Medicine

## 2015-08-14 DIAGNOSIS — G4733 Obstructive sleep apnea (adult) (pediatric): Secondary | ICD-10-CM | POA: Diagnosis not present

## 2015-08-19 ENCOUNTER — Other Ambulatory Visit: Payer: Self-pay | Admitting: Family Medicine

## 2015-08-20 ENCOUNTER — Other Ambulatory Visit: Payer: Self-pay | Admitting: Family Medicine

## 2015-08-20 DIAGNOSIS — M722 Plantar fascial fibromatosis: Secondary | ICD-10-CM | POA: Diagnosis not present

## 2015-08-20 DIAGNOSIS — M65872 Other synovitis and tenosynovitis, left ankle and foot: Secondary | ICD-10-CM | POA: Diagnosis not present

## 2015-08-20 DIAGNOSIS — M71572 Other bursitis, not elsewhere classified, left ankle and foot: Secondary | ICD-10-CM | POA: Diagnosis not present

## 2015-08-21 ENCOUNTER — Other Ambulatory Visit: Payer: Self-pay | Admitting: Family Medicine

## 2015-08-22 ENCOUNTER — Encounter: Payer: Self-pay | Admitting: Internal Medicine

## 2015-08-22 ENCOUNTER — Other Ambulatory Visit (INDEPENDENT_AMBULATORY_CARE_PROVIDER_SITE_OTHER): Payer: Medicare Other

## 2015-08-22 ENCOUNTER — Ambulatory Visit (INDEPENDENT_AMBULATORY_CARE_PROVIDER_SITE_OTHER): Payer: Medicare Other | Admitting: Internal Medicine

## 2015-08-22 VITALS — BP 160/100 | HR 73 | Temp 98.4°F | Resp 16 | Ht 68.0 in | Wt 235.0 lb

## 2015-08-22 DIAGNOSIS — D510 Vitamin B12 deficiency anemia due to intrinsic factor deficiency: Secondary | ICD-10-CM

## 2015-08-22 DIAGNOSIS — R609 Edema, unspecified: Secondary | ICD-10-CM

## 2015-08-22 DIAGNOSIS — M109 Gout, unspecified: Secondary | ICD-10-CM

## 2015-08-22 DIAGNOSIS — G4733 Obstructive sleep apnea (adult) (pediatric): Secondary | ICD-10-CM

## 2015-08-22 DIAGNOSIS — I10 Essential (primary) hypertension: Secondary | ICD-10-CM

## 2015-08-22 DIAGNOSIS — N182 Chronic kidney disease, stage 2 (mild): Secondary | ICD-10-CM

## 2015-08-22 LAB — BASIC METABOLIC PANEL
BUN: 48 mg/dL — ABNORMAL HIGH (ref 6–23)
CO2: 29 mEq/L (ref 19–32)
Calcium: 10.3 mg/dL (ref 8.4–10.5)
Chloride: 98 mEq/L (ref 96–112)
Creatinine, Ser: 3.25 mg/dL — ABNORMAL HIGH (ref 0.40–1.50)
GFR: 24.47 mL/min — AB (ref >60.00)
GLUCOSE: 103 mg/dL — AB (ref 70–99)
POTASSIUM: 4.1 meq/L (ref 3.5–5.1)
SODIUM: 137 meq/L (ref 135–145)

## 2015-08-22 LAB — CBC WITH DIFFERENTIAL/PLATELET
BASOS ABS: 0 10*3/uL (ref 0.0–0.1)
Basophils Relative: 0.1 % (ref 0.0–3.0)
EOS ABS: 0 10*3/uL (ref 0.0–0.7)
EOS PCT: 0.2 % (ref 0.0–5.0)
HEMATOCRIT: 34.6 % — AB (ref 39.0–52.0)
Hemoglobin: 11.2 g/dL — ABNORMAL LOW (ref 13.0–17.0)
LYMPHS PCT: 7.8 % — AB (ref 12.0–46.0)
Lymphs Abs: 0.9 10*3/uL (ref 0.7–4.0)
MCHC: 32.3 g/dL (ref 30.0–36.0)
MCV: 85.6 fl (ref 78.0–100.0)
MONOS PCT: 8.6 % (ref 3.0–12.0)
Monocytes Absolute: 1 10*3/uL (ref 0.1–1.0)
Neutro Abs: 9.5 10*3/uL — ABNORMAL HIGH (ref 1.4–7.7)
Neutrophils Relative %: 83.3 % — ABNORMAL HIGH (ref 43.0–77.0)
PLATELETS: 233 10*3/uL (ref 150.0–400.0)
RBC: 4.04 Mil/uL — ABNORMAL LOW (ref 4.22–5.81)
RDW: 17.6 % — ABNORMAL HIGH (ref 11.5–15.5)
WBC: 11.4 10*3/uL — AB (ref 4.0–10.5)

## 2015-08-22 LAB — TSH: TSH: 1.9 u[IU]/mL (ref 0.35–4.50)

## 2015-08-22 LAB — IBC PANEL
IRON: 55 ug/dL (ref 42–165)
SATURATION RATIOS: 19.1 % — AB (ref 20.0–50.0)
Transferrin: 206 mg/dL — ABNORMAL LOW (ref 212.0–360.0)

## 2015-08-22 LAB — FERRITIN: Ferritin: 68.4 ng/mL (ref 22.0–322.0)

## 2015-08-22 LAB — URIC ACID: Uric Acid, Serum: 4.2 mg/dL (ref 4.0–7.8)

## 2015-08-22 LAB — FOLATE: Folate: 10.1 ng/mL (ref >5.9)

## 2015-08-22 LAB — VITAMIN B12: VITAMIN B 12: 491 pg/mL (ref 211–911)

## 2015-08-22 MED ORDER — DOXAZOSIN MESYLATE 8 MG PO TABS: 8.0000 mg | ORAL_TABLET | Freq: Every day | ORAL | Status: DC

## 2015-08-22 MED ORDER — AMLODIPINE BESYLATE 5 MG PO TABS: 5.0000 mg | ORAL_TABLET | Freq: Every day | ORAL | Status: DC

## 2015-08-22 MED ORDER — KLOR-CON M20 20 MEQ PO TBCR: 20.0000 meq | EXTENDED_RELEASE_TABLET | Freq: Once | ORAL | Status: DC

## 2015-08-22 NOTE — Patient Instructions (Signed)
Hypertension Hypertension, commonly called high blood pressure, is when the force of blood pumping through your arteries is too strong. Your arteries are the blood vessels that carry blood from your heart throughout your body. A blood pressure reading consists of a higher number over a lower number, such as 110/72. The higher number (systolic) is the pressure inside your arteries when your heart pumps. The lower number (diastolic) is the pressure inside your arteries when your heart relaxes. Ideally you want your blood pressure below 120/80. Hypertension forces your heart to work harder to pump blood. Your arteries may become narrow or stiff. Having untreated or uncontrolled hypertension can cause heart attack, stroke, kidney disease, and other problems. RISK FACTORS Some risk factors for high blood pressure are controllable. Others are not.  Risk factors you cannot control include:   Race. You may be at higher risk if you are African American.  Age. Risk increases with age.  Gender. Men are at higher risk than women before age 45 years. After age 65, women are at higher risk than men. Risk factors you can control include:  Not getting enough exercise or physical activity.  Being overweight.  Getting too much fat, sugar, calories, or salt in your diet.  Drinking too much alcohol. SIGNS AND SYMPTOMS Hypertension does not usually cause signs or symptoms. Extremely high blood pressure (hypertensive crisis) may cause headache, anxiety, shortness of breath, and nosebleed. DIAGNOSIS To check if you have hypertension, your health care provider will measure your blood pressure while you are seated, with your arm held at the level of your heart. It should be measured at least twice using the same arm. Certain conditions can cause a difference in blood pressure between your right and left arms. A blood pressure reading that is higher than normal on one occasion does not mean that you need treatment. If  it is not clear whether you have high blood pressure, you may be asked to return on a different day to have your blood pressure checked again. Or, you may be asked to monitor your blood pressure at home for 1 or more weeks. TREATMENT Treating high blood pressure includes making lifestyle changes and possibly taking medicine. Living a healthy lifestyle can help lower high blood pressure. You may need to change some of your habits. Lifestyle changes may include:  Following the DASH diet. This diet is high in fruits, vegetables, and whole grains. It is low in salt, red meat, and added sugars.  Keep your sodium intake below 2,300 mg per day.  Getting at least 30-45 minutes of aerobic exercise at least 4 times per week.  Losing weight if necessary.  Not smoking.  Limiting alcoholic beverages.  Learning ways to reduce stress. Your health care provider may prescribe medicine if lifestyle changes are not enough to get your blood pressure under control, and if one of the following is true:  You are 18-59 years of age and your systolic blood pressure is above 140.  You are 60 years of age or older, and your systolic blood pressure is above 150.  Your diastolic blood pressure is above 90.  You have diabetes, and your systolic blood pressure is over 140 or your diastolic blood pressure is over 90.  You have kidney disease and your blood pressure is above 140/90.  You have heart disease and your blood pressure is above 140/90. Your personal target blood pressure may vary depending on your medical conditions, your age, and other factors. HOME CARE INSTRUCTIONS    Have your blood pressure rechecked as directed by your health care provider.   Take medicines only as directed by your health care provider. Follow the directions carefully. Blood pressure medicines must be taken as prescribed. The medicine does not work as well when you skip doses. Skipping doses also puts you at risk for  problems.  Do not smoke.   Monitor your blood pressure at home as directed by your health care provider. SEEK MEDICAL CARE IF:   You think you are having a reaction to medicines taken.  You have recurrent headaches or feel dizzy.  You have swelling in your ankles.  You have trouble with your vision. SEEK IMMEDIATE MEDICAL CARE IF:  You develop a severe headache or confusion.  You have unusual weakness, numbness, or feel faint.  You have severe chest or abdominal pain.  You vomit repeatedly.  You have trouble breathing. MAKE SURE YOU:   Understand these instructions.  Will watch your condition.  Will get help right away if you are not doing well or get worse.   This information is not intended to replace advice given to you by your health care provider. Make sure you discuss any questions you have with your health care provider.   Document Released: 06/23/2005 Document Revised: 11/07/2014 Document Reviewed: 04/15/2013 Elsevier Interactive Patient Education 2016 [employer]  

## 2015-08-22 NOTE — Progress Notes (Signed)
Subjective:  Patient ID: Jason [name], male    DOB: [birth year]  Age: 70 y.o. MRN: [id number]  CC: Anemia and Hypertension  NEW TO ME  HPI Jason [name] presents for a blood pressure check and follow-up on anemia. He has had hypertension for many years and has renal insufficiency as a result. He tells me that he recently saw his kidney doctor and was told that everything was stable but he has been asked to maintain very good control of his blood pressure. He has not gotten control with the current combination of furosemide, carvedilol, and doxazosin. He does tell me that his kidney doctor recently increased his carvedilol dose but he still does not have good blood pressure control. He does not have any secondary symptoms of hypertension. He has a chronic anemia that needs further evaluation as well. He does not report any sources of blood loss.  Outpatient Prescriptions Prior to Visit  Medication Sig Dispense Refill  . acetaminophen (TYLENOL) 325 MG tablet Take 2 tablets (650 mg total) by mouth every 4 (four) hours as needed for moderate pain.    Marland Kitchen allopurinol (ZYLOPRIM) 300 MG tablet TAKE ONE TABLET BY MOUTH ONCE DAILY IN THE MORNING 45 tablet 0  . furosemide (LASIX) 80 MG tablet TAKE ONE TABLET BY MOUTH ONCE DAILY 90 tablet 0  . gabapentin (NEURONTIN) 300 MG capsule Take 300 mg by mouth daily.    Marland Kitchen loperamide (IMODIUM) 2 MG capsule Take 1 capsule (2 mg total) by mouth as needed for diarrhea or loose stools. 30 capsule 3  . polycarbophil (FIBERCON) 625 MG tablet Take 1,250 mg by mouth daily.    . Vitamin D, Ergocalciferol, (DRISDOL) 50000 UNITS CAPS capsule Take 50,000 Units by mouth every 7 (seven) days.    . carvedilol (COREG) 6.25 MG tablet Take 1 tablet (6.25 mg total) by mouth 2 (two) times daily with a meal. 200 tablet 3  . doxazosin (CARDURA) 8 MG tablet Take 1 tablet (8 mg total) by mouth at bedtime. 90 tablet 3   No facility-administered medications prior to visit.    ROS Review  of Systems  Constitutional: Negative.  Negative for fever, chills, diaphoresis, appetite change and fatigue.  HENT: Negative.  Negative for nosebleeds, trouble swallowing and voice change.   Eyes: Negative.   Respiratory: Negative.  Negative for cough, choking, chest tightness, shortness of breath and stridor.   Cardiovascular: Negative.  Negative for chest pain, palpitations and leg swelling.  Gastrointestinal: Negative.  Negative for nausea, vomiting, abdominal pain, diarrhea, constipation and blood in stool.  Endocrine: Negative.   Genitourinary: Negative.  Negative for dysuria, urgency, hematuria, flank pain and difficulty urinating.  Musculoskeletal: Negative.   Skin: Negative.   Allergic/Immunologic: Negative.   Neurological: Negative.  Negative for dizziness, syncope, speech difficulty, weakness, numbness and headaches.  Hematological: Negative.  Negative for adenopathy. Does not bruise/bleed easily.  Psychiatric/Behavioral: Negative.     Objective:  BP 160/100 mmHg  Pulse 73  Temp(Src) 98.4 F (36.9 C) (Oral)  Resp 16  Ht '5\' 8"'$  (1.727 m)  Wt 235 lb (106.595 kg)  BMI 35.74 kg/m2  SpO2 99%  BP Readings from Last 3 Encounters:  08/22/15 160/100  06/04/15 142/84  04/27/15 140/90    Wt Readings from Last 3 Encounters:  08/22/15 235 lb (106.595 kg)  06/04/15 232 lb (105.235 kg)  04/27/15 224 lb (101.606 kg)    Physical Exam  Constitutional: He is oriented to person, place, and time. He appears well-developed  and well-nourished. No distress.  HENT:  Mouth/Throat: Oropharynx is clear and moist. No oropharyngeal exudate.  Eyes: Conjunctivae are normal. Right eye exhibits no discharge. No scleral icterus.  Neck: Normal range of motion. Neck supple. No JVD present. No tracheal deviation present. No thyromegaly present.  Cardiovascular: Normal rate, regular rhythm, normal heart sounds and intact distal pulses.  Exam reveals no gallop and no friction rub.   No murmur  heard. Pulmonary/Chest: Effort normal and breath sounds normal. No stridor. No respiratory distress. He has no wheezes. He has no rales. He exhibits no tenderness.  Abdominal: Soft. Bowel sounds are normal. He exhibits no distension and no mass. There is no tenderness. There is no rebound and no guarding.  Musculoskeletal: Normal range of motion. He exhibits edema (trace edema in bilateral lower extremity). He exhibits no tenderness.  Lymphadenopathy:    He has no cervical adenopathy.  Neurological: He is oriented to person, place, and time.  Skin: Skin is warm and dry. No rash noted. He is not diaphoretic. No erythema. No pallor.  Vitals reviewed.   Lab Results  Component Value Date   WBC 11.4* 08/22/2015   HGB 11.2* 08/22/2015   HCT 34.6* 08/22/2015   PLT 233.0 08/22/2015   GLUCOSE 103* 08/22/2015   CHOL 208* 06/04/2015   TRIG 203.0* 06/04/2015   HDL 34.80* 06/04/2015   LDLDIRECT 120.0 06/04/2015   LDLCALC 131* 05/29/2010   ALT 7 06/04/2015   AST 11 06/04/2015   NA 137 08/22/2015   K 4.1 08/22/2015   CL 98 08/22/2015   CREATININE 3.25* 08/22/2015   BUN 48* 08/22/2015   CO2 29 08/22/2015   TSH 1.90 08/22/2015   PSA 1.86 08/10/2013   INR 1.16 05/26/2013   HGBA1C 6.0 12/20/2009    Ct Chest Wo Contrast  07/12/2015  CLINICAL DATA:  Follow-up pulmonary nodule. History of rectal cancer diagnosed November 2014, status post chemotherapy and XRT. EXAM: CT CHEST WITHOUT CONTRAST TECHNIQUE: Multidetector CT imaging of the chest was performed following the standard protocol without IV contrast. COMPARISON:  Multiple CTs, including 04/05/2015 and 02/10/2013 FINDINGS: Mediastinum/Nodes: Heart is normal in size. No pericardial effusion. Mild three-vessel coronary atherosclerosis. Atherosclerotic calcifications aortic arch. Small mediastinal lymph nodes which do not meet pathologic CT size criteria. Visualized thyroid is unremarkable. Lungs/Pleura: Subpleural thickening/nodularity bilaterally,  including: --14 x 8 mm subpleural nodule along the lateral right upper lobe (series 5/ image 17) which is similar to 2014 --12 x 10 mm subpleural nodule in the posteromedial left upper lobe (series 5/image 19) which previously measured 10 x 8 mm and has significantly progressed from 2014 when it measured 5 mm 9 mm nodule in the central right upper lobe (series 5/ image 24), grossly unchanged since 2014. Mild subpleural reticulation/ scarring bilaterally. No focal consolidation. No pleural effusion or pneumothorax. Upper abdomen: Visualized upper abdomen is notable for mild vascular calcifications. Musculoskeletal: Degenerative changes of the visualized thoracolumbar spine. IMPRESSION: 12 x 10 mm subpleural nodule in the posteromedial left upper lobe, mildly increased from the most recent study and significantly increased from 2014. Additional subpleural nodularity and pulmonary nodularity is grossly unchanged. Consider follow-up PET-CT in 3 months. Electronically Signed   By: Julian Hy M.D.   On: 07/12/2015 18:55    Assessment & Plan:   Nicko was seen today for anemia and hypertension.  Diagnoses and all orders for this visit:  Essential hypertension- he has difficult to control hypertension on several agents, I've asked him to have his sleep apnea  evaluated again since treatment of this may help lower his blood pressure, for now he will increase the carvedilol and I will add 5 mg of Norvasc for better blood pressure control, his renal function is stable and his electrolytes are normal. -     doxazosin (CARDURA) 8 MG tablet; Take 1 tablet (8 mg total) by mouth at bedtime. -     KLOR-CON M20 20 MEQ tablet; Take 1 tablet (20 mEq total) by mouth once. -     amLODipine (NORVASC) 5 MG tablet; Take 1 tablet (5 mg total) by mouth daily. -     Basic metabolic panel; Future -     TSH; Future  Obstructive sleep apnea- he has not recently had this evaluated so I've asked him to see a sleep medicine. -      Ambulatory referral to Pulmonology  Chronic renal insufficiency, stage 2 (mild)- will seek to get better blood pressure control with the addition of amlodipine. -     Basic metabolic panel; Future  Peripheral edema  Pernicious anemia- his CBC is stable and his vitamin levels are normal, this is most consistent with the anemia of chronic renal disease, he will continue to follow-up with his nephrologist. -     CBC with Differential/Platelet; Future -     Vitamin B12; Future -     Ferritin; Future -     Folate; Future -     IBC panel; Future  GOUT -     Uric acid; Future  I have changed Mr. Keath Matera M20. I am also having him start on amLODipine. Additionally, I am having him maintain his acetaminophen, loperamide, polycarbophil, Vitamin D (Ergocalciferol), gabapentin, allopurinol, furosemide, carvedilol, and doxazosin.  Meds ordered this encounter  Medications  . carvedilol (COREG) 12.5 MG tablet    Sig:   . DISCONTD: KLOR-CON M20 20 MEQ tablet    Sig:   . doxazosin (CARDURA) 8 MG tablet    Sig: Take 1 tablet (8 mg total) by mouth at bedtime.    Dispense:  90 tablet    Refill:  3  . KLOR-CON M20 20 MEQ tablet    Sig: Take 1 tablet (20 mEq total) by mouth once.    Dispense:  90 tablet    Refill:  1  . amLODipine (NORVASC) 5 MG tablet    Sig: Take 1 tablet (5 mg total) by mouth daily.    Dispense:  90 tablet    Refill:  3     Follow-up: Return in about 2 months (around 10/20/2015).  [name], MD

## 2015-08-22 NOTE — Progress Notes (Signed)
Pre visit review using our clinic review tool, if applicable. No additional management support is needed unless otherwise documented below in the visit note. 

## 2015-08-30 ENCOUNTER — Telehealth: Payer: Self-pay | Admitting: Internal Medicine

## 2015-08-30 NOTE — Telephone Encounter (Signed)
Patient states he had a missed call.  Did you call him?

## 2015-08-31 NOTE — Telephone Encounter (Signed)
I do not see where anyone has called.

## 2015-08-31 NOTE — Telephone Encounter (Signed)
Possibly Pulmonology for sleep apnea referral

## 2015-08-31 NOTE — Telephone Encounter (Signed)
Pt informed. Does not seem interested in sleep apnea referral at this time

## 2015-09-05 ENCOUNTER — Other Ambulatory Visit: Payer: Self-pay | Admitting: Family

## 2015-09-11 DIAGNOSIS — G4733 Obstructive sleep apnea (adult) (pediatric): Secondary | ICD-10-CM | POA: Diagnosis not present

## 2015-09-20 DIAGNOSIS — M722 Plantar fascial fibromatosis: Secondary | ICD-10-CM | POA: Diagnosis not present

## 2015-09-20 DIAGNOSIS — M65871 Other synovitis and tenosynovitis, right ankle and foot: Secondary | ICD-10-CM | POA: Diagnosis not present

## 2015-09-24 DIAGNOSIS — M25562 Pain in left knee: Secondary | ICD-10-CM | POA: Diagnosis not present

## 2015-09-27 ENCOUNTER — Ambulatory Visit (HOSPITAL_BASED_OUTPATIENT_CLINIC_OR_DEPARTMENT_OTHER): Payer: Medicare Other | Admitting: Oncology

## 2015-09-27 ENCOUNTER — Telehealth: Payer: Self-pay | Admitting: Oncology

## 2015-09-27 ENCOUNTER — Telehealth: Payer: Self-pay | Admitting: Nurse Practitioner

## 2015-09-27 ENCOUNTER — Other Ambulatory Visit (HOSPITAL_BASED_OUTPATIENT_CLINIC_OR_DEPARTMENT_OTHER): Payer: Medicare Other

## 2015-09-27 VITALS — BP 159/72 | HR 88 | Temp 98.2°F | Resp 19 | Ht 68.0 in | Wt 240.2 lb

## 2015-09-27 DIAGNOSIS — C20 Malignant neoplasm of rectum: Secondary | ICD-10-CM

## 2015-09-27 DIAGNOSIS — R911 Solitary pulmonary nodule: Secondary | ICD-10-CM

## 2015-09-27 DIAGNOSIS — M7989 Other specified soft tissue disorders: Secondary | ICD-10-CM | POA: Diagnosis not present

## 2015-09-27 DIAGNOSIS — Z85048 Personal history of other malignant neoplasm of rectum, rectosigmoid junction, and anus: Secondary | ICD-10-CM

## 2015-09-27 NOTE — Telephone Encounter (Signed)
per [facility] to sch pt app-adv [facility] will call to sch scan-gave pt copy of avs

## 2015-09-27 NOTE — Telephone Encounter (Signed)
per pof to sch pt appt-gave pt copy of avs °

## 2015-09-27 NOTE — Progress Notes (Signed)
[place] OFFICE PROGRESS NOTE   Diagnosis:  Rectal cancer  INTERVAL HISTORY:    Jason [name] returns as scheduled. He feels well. Good appetite. No difficulty with bowel function. He reports swelling of the left lower leg for the past week. No pain.   restaging CT scans 04/05/2015 revealed enlargement of a subpleural nodule in the posterior medial left upper lobe measuring 1.2 cm compared to a previous measurement of 0.8 cm. No evidence of metastatic rectal cancer.   A repeat chest CT on 07/12/2015 found the posterior medial left upper lobe nodule to be mildly increased compared to September 2016.  Objective:  Vital signs in last 24 hours:  Blood pressure 159/72, pulse 88, temperature 98.2 F (36.8 C), temperature source Oral, resp. rate 19, height '5\' 8"'$  (1.727 m), weight 240 lb 3.2 oz (108.954 kg), SpO2 100 %.    HEENT:  Neck without mass Lymphatics:  No cervical, supra-clavicular, axillary, or inguinal nodes Resp:  Lungs clear bilaterally Cardio:  Regular rate and rhythm GI:  No hepatomegaly, no mass, nontender Vascular:  The left lower leg is slightly larger than the right side, no erythema or tenderness  musculoskeletal: No spine or  leftchest wall tenderness      Lab Results:   CEA pending   Medications: I have reviewed the patient's current medications.  Assessment/Plan: 1. Rectal cancer, clinical stage III (uT3, uN2).  Initiation of concurrent Xeloda and radiation on 02/21/2013, completed 03/31/2013.   CEA normal 04/25/2013   Status post laparoscopic low anterior resection with diverting loop ileostomy 05/23/2013. Final pathology showed a 3.2 cm invasive adenocarcinoma with neoadjuvant related change. Tumor invaded into perirectal soft tissue. There was no lymphovascular or perineural invasion. 14 lymph nodes were negative for tumor. Surgical margins were negative. There was one hyperplastic polyp (ypT3, pN0).   Initiation of weekly  5-FU/leucovorin 08/01/2013   Ileostomy takedown 08/24/2013.   Weekly 5-FU/leucovorin resumed 09/22/2013.  Restaging CTs 02/20/2014 negative for evidence of recurrent disease   restaging CT scans 04/05/2015 negative for evidence of recurrent disease  Surveillance colonoscopy 03/31/2014, status post removal of of a tubular adenoma from the ascending colon 2. Markedly elevated pretreatment CEA 3. Indeterminate pulmonary/subpleural nodules and right hepatic dome lesion. Unchanged on the CT 04/25/2013, unchanged nodular lung lesions on the CT 02/20/2014 4. Renal insufficiency. Acute on chronic renal failure during hospitalization November/December 2014. Improved following the ileostomy takedown. 5. History of Anemia secondary chemotherapy, chronic disease, and renal insufficiency 6. Hypertension. 7. Gout. 8. Prolonged postoperative ileus following laparoscopic low anterior resection with diverting loop ileostomy 05/23/2013. 9. C. difficile-positive 09/08/2013-status post Flagyl with improvement. Recurrent diarrhea following completion of Flagyl. Stool positive for C. difficile on 09/24/2013. He completed treatment with vancomycin. The diarrhea resolved. 10. Left greater than right low leg edema. Venous Doppler 09/26/2013 negative for left leg DVT. 11. Rectal urgency/incontinence-Improved with participation in the pelvic physical therapy clinic. 12.  enlarging left posterior medial upper lobe nodule on the chest CT 04/05/2015 and 07/12/2015, other lung nodules are stable     Disposition:   Jason [name] remains in clinical remission from rectal cancer. We will follow-up on the CEA from today. He will return for an office visit and CEA in 6 months. He will undergo a final set of surveillance CT scans in September 2016. I will present his case at the GI tumor conference. I suspect the pleural-based lesion in the left lung is a benign finding. We will discuss the indication for a PET  scan and  thoracic surgery referral.  The left leg swelling is likely a benign finding. However low clinical suspicion for DVT. He is scheduled to see Dr. Ronnald Ramp early next week.  Betsy Coder, MD  09/27/2015  4:03 PM  e i

## 2015-09-28 ENCOUNTER — Telehealth: Payer: Self-pay | Admitting: *Deleted

## 2015-09-28 LAB — CEA: CEA1: 2.8 ng/mL (ref 0.0–4.7)

## 2015-09-28 LAB — CEA (PARALLEL TESTING): CEA: 0.9 ng/mL

## 2015-09-28 NOTE — Telephone Encounter (Signed)
-----   Message from [name], MD sent at 09/28/2015  2:11 PM EDT ----- Please call patient, cea is normal

## 2015-09-28 NOTE — Telephone Encounter (Signed)
Notified pt of normal CEA. He voiced understanding.

## 2015-10-02 ENCOUNTER — Ambulatory Visit (INDEPENDENT_AMBULATORY_CARE_PROVIDER_SITE_OTHER): Payer: Medicare Other | Admitting: Internal Medicine

## 2015-10-02 ENCOUNTER — Encounter: Payer: Self-pay | Admitting: Internal Medicine

## 2015-10-02 VITALS — BP 144/80 | HR 79 | Temp 98.3°F | Resp 16 | Ht 68.0 in | Wt 236.0 lb

## 2015-10-02 DIAGNOSIS — N182 Chronic kidney disease, stage 2 (mild): Secondary | ICD-10-CM | POA: Diagnosis not present

## 2015-10-02 DIAGNOSIS — I10 Essential (primary) hypertension: Secondary | ICD-10-CM | POA: Diagnosis not present

## 2015-10-02 DIAGNOSIS — M109 Gout, unspecified: Secondary | ICD-10-CM | POA: Diagnosis not present

## 2015-10-02 DIAGNOSIS — M25473 Effusion, unspecified ankle: Secondary | ICD-10-CM

## 2015-10-02 DIAGNOSIS — D510 Vitamin B12 deficiency anemia due to intrinsic factor deficiency: Secondary | ICD-10-CM

## 2015-10-02 NOTE — Progress Notes (Signed)
Pre visit review using our clinic review tool, if applicable. No additional management support is needed unless otherwise documented below in the visit note. 

## 2015-10-02 NOTE — Patient Instructions (Signed)
Hypertension Hypertension, commonly called high blood pressure, is when the force of blood pumping through your arteries is too strong. Your arteries are the blood vessels that carry blood from your heart throughout your body. A blood pressure reading consists of a higher number over a lower number, such as 110/72. The higher number (systolic) is the pressure inside your arteries when your heart pumps. The lower number (diastolic) is the pressure inside your arteries when your heart relaxes. Ideally you want your blood pressure below 120/80. Hypertension forces your heart to work harder to pump blood. Your arteries may become narrow or stiff. Having untreated or uncontrolled hypertension can cause heart attack, stroke, kidney disease, and other problems. RISK FACTORS Some risk factors for high blood pressure are controllable. Others are not.  Risk factors you cannot control include:   Race. You may be at higher risk if you are African American.  Age. Risk increases with age.  Gender. Men are at higher risk than women before age 45 years. After age 65, women are at higher risk than men. Risk factors you can control include:  Not getting enough exercise or physical activity.  Being overweight.  Getting too much fat, sugar, calories, or salt in your diet.  Drinking too much alcohol. SIGNS AND SYMPTOMS Hypertension does not usually cause signs or symptoms. Extremely high blood pressure (hypertensive crisis) may cause headache, anxiety, shortness of breath, and nosebleed. DIAGNOSIS To check if you have hypertension, your health care provider will measure your blood pressure while you are seated, with your arm held at the level of your heart. It should be measured at least twice using the same arm. Certain conditions can cause a difference in blood pressure between your right and left arms. A blood pressure reading that is higher than normal on one occasion does not mean that you need treatment. If  it is not clear whether you have high blood pressure, you may be asked to return on a different day to have your blood pressure checked again. Or, you may be asked to monitor your blood pressure at home for 1 or more weeks. TREATMENT Treating high blood pressure includes making lifestyle changes and possibly taking medicine. Living a healthy lifestyle can help lower high blood pressure. You may need to change some of your habits. Lifestyle changes may include:  Following the DASH diet. This diet is high in fruits, vegetables, and whole grains. It is low in salt, red meat, and added sugars.  Keep your sodium intake below 2,300 mg per day.  Getting at least 30-45 minutes of aerobic exercise at least 4 times per week.  Losing weight if necessary.  Not smoking.  Limiting alcoholic beverages.  Learning ways to reduce stress. Your health care provider may prescribe medicine if lifestyle changes are not enough to get your blood pressure under control, and if one of the following is true:  You are 18-59 years of age and your systolic blood pressure is above 140.  You are 60 years of age or older, and your systolic blood pressure is above 150.  Your diastolic blood pressure is above 90.  You have diabetes, and your systolic blood pressure is over 140 or your diastolic blood pressure is over 90.  You have kidney disease and your blood pressure is above 140/90.  You have heart disease and your blood pressure is above 140/90. Your personal target blood pressure may vary depending on your medical conditions, your age, and other factors. HOME CARE INSTRUCTIONS    Have your blood pressure rechecked as directed by your health care provider.   Take medicines only as directed by your health care provider. Follow the directions carefully. Blood pressure medicines must be taken as prescribed. The medicine does not work as well when you skip doses. Skipping doses also puts you at risk for  problems.  Do not smoke.   Monitor your blood pressure at home as directed by your health care provider. SEEK MEDICAL CARE IF:   You think you are having a reaction to medicines taken.  You have recurrent headaches or feel dizzy.  You have swelling in your ankles.  You have trouble with your vision. SEEK IMMEDIATE MEDICAL CARE IF:  You develop a severe headache or confusion.  You have unusual weakness, numbness, or feel faint.  You have severe chest or abdominal pain.  You vomit repeatedly.  You have trouble breathing. MAKE SURE YOU:   Understand these instructions.  Will watch your condition.  Will get help right away if you are not doing well or get worse.   This information is not intended to replace advice given to you by your health care provider. Make sure you discuss any questions you have with your health care provider.   Document Released: 06/23/2005 Document Revised: 11/07/2014 Document Reviewed: 04/15/2013 Elsevier Interactive Patient Education 2016 [employer]  

## 2015-10-02 NOTE — Progress Notes (Signed)
Subjective:  Patient ID: Jason Cotta Sr., male    DOB: 03-01-1946  Age: 70 y.o. MRN: [id number]  CC: Hypertension and Edema   HPI Jason BELSITO Sr. presents for a blood pressure check. He complains of edema around both ankles, more on the left than the right. This started spontaneously about a week ago. The edema does not extend into his calves and he denies any calf pain or swelling. He denies claudication, chest pain, shortness of breath, dyspnea on exertion, palpitations, or near-syncope.  Outpatient Prescriptions Prior to Visit  Medication Sig Dispense Refill  . acetaminophen (TYLENOL) 325 MG tablet Take 2 tablets (650 mg total) by mouth every 4 (four) hours as needed for moderate pain.    Marland Kitchen allopurinol (ZYLOPRIM) 300 MG tablet TAKE ONE TABLET BY MOUTH ONCE DAILY IN THE MORNING 90 tablet 2  . carvedilol (COREG) 12.5 MG tablet Take 12.5 mg by mouth 2 (two) times daily with a meal.     . doxazosin (CARDURA) 8 MG tablet Take 1 tablet (8 mg total) by mouth at bedtime. 90 tablet 3  . furosemide (LASIX) 80 MG tablet TAKE ONE TABLET BY MOUTH ONCE DAILY 90 tablet 0  . gabapentin (NEURONTIN) 300 MG capsule Take 300 mg by mouth daily.    Marland Kitchen KLOR-CON M20 20 MEQ tablet Take 1 tablet (20 mEq total) by mouth once. (Patient taking differently: Take 20 mEq by mouth 2 (two) times daily. ) 90 tablet 1  . loperamide (IMODIUM) 2 MG capsule Take 1 capsule (2 mg total) by mouth as needed for diarrhea or loose stools. 30 capsule 3  . polycarbophil (FIBERCON) 625 MG tablet Take 1,250 mg by mouth daily.    . Vitamin D, Ergocalciferol, (DRISDOL) 50000 UNITS CAPS capsule Take 50,000 Units by mouth every 7 (seven) days.    Marland Kitchen amLODipine (NORVASC) 5 MG tablet Take 1 tablet (5 mg total) by mouth daily. 90 tablet 3   No facility-administered medications prior to visit.    ROS Review of Systems  Constitutional: Negative.  Negative for fever, chills, diaphoresis, appetite change and fatigue.  HENT: Negative.     Eyes: Negative.   Respiratory: Negative.  Negative for cough, choking, chest tightness, shortness of breath and stridor.   Cardiovascular: Positive for leg swelling. Negative for chest pain and palpitations.  Gastrointestinal: Negative.  Negative for nausea, vomiting, abdominal pain, diarrhea and constipation.  Endocrine: Negative.   Genitourinary: Negative.  Negative for dysuria, urgency, hematuria and difficulty urinating.  Musculoskeletal: Negative.  Negative for myalgias, back pain and arthralgias.  Skin: Negative for color change and rash.  Allergic/Immunologic: Negative.   Neurological: Negative.  Negative for dizziness, tremors, syncope, light-headedness, numbness and headaches.  Hematological: Negative.  Negative for adenopathy. Does not bruise/bleed easily.  Psychiatric/Behavioral: Negative.     Objective:  BP 144/80 mmHg  Pulse 79  Temp(Src) 98.3 F (36.8 C) (Oral)  Resp 16  Ht '5\' 8"'$  (1.727 m)  Wt 236 lb (107.049 kg)  BMI 35.89 kg/m2  SpO2 99%  BP Readings from Last 3 Encounters:  10/02/15 144/80  09/27/15 159/72  08/22/15 160/100    Wt Readings from Last 3 Encounters:  10/02/15 236 lb (107.049 kg)  09/27/15 240 lb 3.2 oz (108.954 kg)  08/22/15 235 lb (106.595 kg)    Physical Exam  Constitutional: He is oriented to person, place, and time. He appears well-developed and well-nourished.  Non-toxic appearance. He does not have a sickly appearance. He does not appear ill. No  distress.  HENT:  Mouth/Throat: Oropharynx is clear and moist. No oropharyngeal exudate.  Eyes: Conjunctivae are normal. Right eye exhibits no discharge. Left eye exhibits no discharge. No scleral icterus.  Neck: Normal range of motion. Neck supple. No JVD present. No tracheal deviation present. No thyromegaly present.  Cardiovascular: Normal rate, regular rhythm, normal heart sounds and intact distal pulses.  Exam reveals no gallop and no friction rub.   No murmur heard. EKG ---  Sinus   Rhythm  WITHIN NORMAL LIMITS   Pulmonary/Chest: Effort normal and breath sounds normal. No stridor. No respiratory distress. He has no wheezes. He has no rales. He exhibits no tenderness.  Abdominal: Soft. Bowel sounds are normal. He exhibits no distension and no mass. There is no tenderness. There is no rebound and no guarding.  Musculoskeletal: Normal range of motion. He exhibits no tenderness.  There is pitting edema isolated to both ankles, on the left side it is 2+ on the right side it is trace  Lymphadenopathy:    He has no cervical adenopathy.  Neurological: He is oriented to person, place, and time.  Skin: Skin is warm and dry. No rash noted. He is not diaphoretic. No erythema. No pallor.  Vitals reviewed.   Lab Results  Component Value Date   WBC 11.4* 08/22/2015   HGB 11.2* 08/22/2015   HCT 34.6* 08/22/2015   PLT 233.0 08/22/2015   GLUCOSE 103* 08/22/2015   CHOL 208* 06/04/2015   TRIG 203.0* 06/04/2015   HDL 34.80* 06/04/2015   LDLDIRECT 120.0 06/04/2015   LDLCALC 131* 05/29/2010   ALT 7 06/04/2015   AST 11 06/04/2015   NA 137 08/22/2015   K 4.1 08/22/2015   CL 98 08/22/2015   CREATININE 3.25* 08/22/2015   BUN 48* 08/22/2015   CO2 29 08/22/2015   TSH 1.90 08/22/2015   PSA 1.86 08/10/2013   INR 1.16 05/26/2013   HGBA1C 6.0 12/20/2009    Ct Chest Wo Contrast   07/12/2015  CLINICAL DATA:  Follow-up pulmonary nodule. History of rectal cancer diagnosed November 2014, status post chemotherapy and XRT. EXAM: CT CHEST WITHOUT CONTRAST TECHNIQUE: Multidetector CT imaging of the chest was performed following the standard protocol without IV contrast. COMPARISON:  Multiple CTs, including 04/05/2015 and 02/10/2013 FINDINGS: Mediastinum/Nodes: Heart is normal in size. No pericardial effusion. Mild three-vessel coronary atherosclerosis. Atherosclerotic calcifications aortic arch. Small mediastinal lymph nodes which do not meet pathologic CT size criteria. Visualized thyroid is  unremarkable. Lungs/Pleura: Subpleural thickening/nodularity bilaterally, including: --14 x 8 mm subpleural nodule along the lateral right upper lobe (series 5/ image 17) which is similar to 2014 --12 x 10 mm subpleural nodule in the posteromedial left upper lobe (series 5/image 19) which previously measured 10 x 8 mm and has significantly progressed from 2014 when it measured 5 mm 9 mm nodule in the central right upper lobe (series 5/ image 24), grossly unchanged since 2014. Mild subpleural reticulation/ scarring bilaterally. No focal consolidation. No pleural effusion or pneumothorax. Upper abdomen: Visualized upper abdomen is notable for mild vascular calcifications. Musculoskeletal: Degenerative changes of the visualized thoracolumbar spine. IMPRESSION: 12 x 10 mm subpleural nodule in the posteromedial left upper lobe, mildly increased from the most recent study and significantly increased from 2014. Additional subpleural nodularity and pulmonary nodularity is grossly unchanged. Consider follow-up PET-CT in 3 months. Electronically Signed   By: Julian Hy M.D.   On: 07/12/2015 18:55    Assessment & Plan:   Jason [name] was seen today for hypertension and  edema.  Diagnoses and all orders for this visit:  Essential hypertension- his blood pressure is well-controlled however I think his peripheral edema is caused by the calcium channel blocker therapy so asked him to stop taking amlodipine, will continue to control his blood pressure with doxazosin, furosemide, and carvedilol. We'll recheck his blood pressure the next few weeks and will treat if indicated.  Chronic renal insufficiency, stage 2 (mild)- recent renal function testing shows that his renal function is stable, will continue to pursue good blood pressure control and he will avoid nephrotoxic agents.  Pernicious anemia- hemoglobin and hematocrit are stable, this is related to his renal dysfunction  GOUT  Ankle swelling, unspecified  laterality- his EKG is normal and he has no systemic complaints so I am not concerned about congestive heart failure or fluid retention, I think this is related to the calcium channel blocker therapy so we'll discontinue the amlodipine and reevaluate later. -     EKG 12-Lead   I have discontinued Mr. Simonich amLODipine. I am also having him maintain his acetaminophen, loperamide, polycarbophil, Vitamin D (Ergocalciferol), gabapentin, furosemide, carvedilol, doxazosin, KLOR-CON M20, and allopurinol.  No orders of the defined types were placed in this encounter.     Follow-up: Return in about 6 weeks (around 11/13/2015).  [name], MD

## 2015-10-05 DIAGNOSIS — M65871 Other synovitis and tenosynovitis, right ankle and foot: Secondary | ICD-10-CM | POA: Diagnosis not present

## 2015-10-05 DIAGNOSIS — M722 Plantar fascial fibromatosis: Secondary | ICD-10-CM | POA: Diagnosis not present

## 2015-10-06 ENCOUNTER — Emergency Department (HOSPITAL_COMMUNITY)
Admission: EM | Admit: 2015-10-06 | Discharge: 2015-10-06 | Disposition: A | Discharge status: Home / Self Care | Payer: Medicare Other | Attending: Emergency Medicine | Admitting: Emergency Medicine

## 2015-10-06 ENCOUNTER — Emergency Department (HOSPITAL_BASED_OUTPATIENT_CLINIC_OR_DEPARTMENT_OTHER)
Admit: 2015-10-06 | Discharge: 2015-10-06 | Disposition: A | Discharge status: Home / Self Care | Payer: Medicare Other | Attending: Emergency Medicine | Admitting: Emergency Medicine

## 2015-10-06 ENCOUNTER — Encounter (HOSPITAL_COMMUNITY): Payer: Self-pay | Admitting: Emergency Medicine

## 2015-10-06 DIAGNOSIS — M25472 Effusion, left ankle: Secondary | ICD-10-CM

## 2015-10-06 DIAGNOSIS — M7989 Other specified soft tissue disorders: Secondary | ICD-10-CM

## 2015-10-06 DIAGNOSIS — M1990 Unspecified osteoarthritis, unspecified site: Secondary | ICD-10-CM | POA: Insufficient documentation

## 2015-10-06 DIAGNOSIS — M79609 Pain in unspecified limb: Secondary | ICD-10-CM | POA: Diagnosis not present

## 2015-10-06 DIAGNOSIS — N189 Chronic kidney disease, unspecified: Secondary | ICD-10-CM | POA: Insufficient documentation

## 2015-10-06 DIAGNOSIS — I129 Hypertensive chronic kidney disease with stage 1 through stage 4 chronic kidney disease, or unspecified chronic kidney disease: Secondary | ICD-10-CM | POA: Insufficient documentation

## 2015-10-06 DIAGNOSIS — R2242 Localized swelling, mass and lump, left lower limb: Secondary | ICD-10-CM | POA: Insufficient documentation

## 2015-10-06 DIAGNOSIS — Z85048 Personal history of other malignant neoplasm of rectum, rectosigmoid junction, and anus: Secondary | ICD-10-CM | POA: Diagnosis not present

## 2015-10-06 DIAGNOSIS — Z862 Personal history of diseases of the blood and blood-forming organs and certain disorders involving the immune mechanism: Secondary | ICD-10-CM | POA: Diagnosis not present

## 2015-10-06 DIAGNOSIS — Z87891 Personal history of nicotine dependence: Secondary | ICD-10-CM | POA: Diagnosis not present

## 2015-10-06 DIAGNOSIS — Z79899 Other long term (current) drug therapy: Secondary | ICD-10-CM | POA: Diagnosis not present

## 2015-10-06 DIAGNOSIS — Z86010 Personal history of colonic polyps: Secondary | ICD-10-CM | POA: Diagnosis not present

## 2015-10-06 DIAGNOSIS — M109 Gout, unspecified: Secondary | ICD-10-CM | POA: Insufficient documentation

## 2015-10-06 NOTE — ED Notes (Addendum)
Pt noted with unilateral lt foot/ankle pitting edema 2+-3+ with pain. Able to move/sensation but unable to palpate pulses d/t edema. Full ROM, denies injury/trauma, CRT brisk. Pt reported seen PMD last Tuesday for lt leg swelling and it went down but it started back yesterday.

## 2015-10-06 NOTE — ED Notes (Signed)
Vascular US at bedside.

## 2015-10-06 NOTE — ED Notes (Signed)
US at bedside

## 2015-10-06 NOTE — ED Provider Notes (Signed)
CSN: [id number]     Arrival date & time 10/06/15  1536 History   First MD Initiated Contact with Patient 10/06/15 1555     Chief Complaint  Patient presents with  . Leg Swelling   (Consider location/radiation/quality/duration/timing/severity/associated sxs/prior Treatment) HPI  70 y.o. male with a hx of HTN, Chronic Renal Insufficiency Stage II, Colon Cancer, Gout, presents to the Emergency Department today complaining of left ankle swelling x1.5 weeks ago. States that he saw his PCP on 10-02-15 for similar issue. PCP explained that the symptoms probably due to Amlodipine usage. Low suspicion for DVT/PE. DCed Amlodipine. Today, no pain around ankle at test, but noted pain on lateral malleolus. Full ROM. Able to ambulate. No CP/SOB/ABD pain. No N/V/D. No headache. No vision changes. No fevers. No other symptoms noted.       Past Medical History  Diagnosis Date  . Gout   . Hypertension   . Hyperplastic colon polyp   . Diverticulosis   . Anemia   . Arthritis   . ED (erectile dysfunction)   . Chronic renal insufficiency, stage II (mild)   . History of radiation therapy 02/21/13-03/31/13    rectum 50.4Gy total dose  . rectal ca dx'd 02/01/13    rectal. Radiation and chemotherapy- remains on chemotherapy-next tx. 08-22-13.    Past Surgical History  Procedure Laterality Date  . Colonoscopy  2010    [facility]  . Eus N/A 02/03/2013    Procedure: LOWER ENDOSCOPIC ULTRASOUND (EUS);  Surgeon: Milus Banister, MD;  Location: [facility];  Service: Endoscopy;  Laterality: N/A;  . Laparoscopic low anterior resection N/A 05/20/2013    Procedure: LAPAROSCOPIC LOW ANTERIOR RESECTION WITH SPLENIC FLEXURE MOBILIZATION;  Surgeon: Leighton Ruff, MD;  Location: [facility];  Service: General;  Laterality: N/A;  . Ostomy N/A 05/20/2013    Procedure: diverting OSTOMY;  Surgeon: Leighton Ruff, MD;  Location: [facility];  Service: General;  Laterality: N/A;  . Colon surgery  05/20/2013  . Ileostomy closure N/A 08/24/2013     Procedure: CLOSURE OF LOOP ILEOSTOMY ;  Surgeon: Leighton Ruff, MD;  Location: [facility];  Service: General;  Laterality: N/A;   Family History  Problem Relation Age of Onset  . Hypertension Other   . Stroke Mother   . Hypertension Mother   . Stroke Father   . Hypertension Father   . Colon cancer Neg Hx    Social History  Substance Use Topics  . Smoking status: Former Smoker -- 0.50 packs/day for 10 years    Quit date: 07/07/1977  . Smokeless tobacco: Never Used  . Alcohol Use: No     Comment: pint of whisky every 2 weeks and occasional beer until about a month ago-Quit 10'14     Review of Systems ROS reviewed and all are negative for acute change except as noted in the HPI.  Allergies  Amlodipine and Nsaids  Home Medications   Prior to Admission medications   Medication Sig Start Date End Date Taking? Authorizing Provider  acetaminophen (TYLENOL) 325 MG tablet Take 2 tablets (650 mg total) by mouth every 4 (four) hours as needed for moderate pain. 78/29/56   Leighton Ruff, MD  allopurinol (ZYLOPRIM) 300 MG tablet TAKE ONE TABLET BY MOUTH ONCE DAILY IN THE MORNING 09/05/15   [name], MD  carvedilol (COREG) 12.5 MG tablet Take 12.5 mg by mouth 2 (two) times daily with a meal.  08/03/15   Historical Provider, MD  doxazosin (CARDURA) 8 MG tablet Take  1 tablet (8 mg total) by mouth at bedtime. 08/22/15   [name], MD  furosemide (LASIX) 80 MG tablet TAKE ONE TABLET BY MOUTH ONCE DAILY 08/13/15   Golden Circle, FNP  gabapentin (NEURONTIN) 300 MG capsule Take 300 mg by mouth daily. 08/31/14   Historical Provider, MD  KLOR-CON M20 20 MEQ tablet Take 1 tablet (20 mEq total) by mouth once. Patient taking differently: Take 20 mEq by mouth 2 (two) times daily.  08/22/15   [name], MD  loperamide (IMODIUM) 2 MG capsule Take 1 capsule (2 mg total) by mouth as needed for diarrhea or loose stools. 12/31/01   Leighton Ruff, MD  polycarbophil (FIBERCON) 625 MG tablet Take 1,250  mg by mouth daily.    Historical Provider, MD  Vitamin D, Ergocalciferol, (DRISDOL) 50000 UNITS CAPS capsule Take 50,000 Units by mouth every 7 (seven) days.    Historical Provider, MD   BP 168/92 mmHg  Pulse 87  Temp(Src) 98.6 F (37 C) (Oral)  Resp 18  Ht '5\' 8"'$  (1.727 m)  Wt 107.049 kg  BMI 35.89 kg/m2  SpO2 97%   Physical Exam  Constitutional: He is oriented to person, place, and time. He appears well-developed and well-nourished.  HENT:  Head: Normocephalic and atraumatic.  Eyes: EOM are normal. Pupils are equal, round, and reactive to light.  Neck: Normal range of motion. Neck supple. No tracheal deviation present.  Cardiovascular: Normal rate, regular rhythm and normal heart sounds.   No murmur heard. Pulmonary/Chest: Effort normal and breath sounds normal. No respiratory distress. He has no wheezes. He has no rales. He exhibits no tenderness.  Abdominal: Soft.  Musculoskeletal: Normal range of motion.       Left ankle: He exhibits swelling. He exhibits normal range of motion, no deformity and normal pulse. Tenderness. Lateral malleolus tenderness found. Achilles tendon normal.  Able to ambulate. Minimal swelling noted around left foot. No calf tenderness. Neg Homans. Distal pulses intact. Cap refill <2sec.   Neurological: He is alert and oriented to person, place, and time.  Skin: Skin is warm and dry.  Psychiatric: He has a normal mood and affect. His behavior is normal. Thought content normal.  Nursing note and vitals reviewed.  ED Course  Procedures (including critical care time) Labs Review Labs Reviewed - No data to display  Imaging Review No results found. I have personally reviewed and evaluated these images and lab results as part of my medical decision-making.   EKG Interpretation None      MDM  I have reviewed and evaluated the relevant laboratory values. I have reviewed and evaluated the relevant imaging studies. I have reviewed the relevant previous  healthcare records. I obtained HPI from historian. Patient discussed with supervising physician  ED Course:  Assessment: Pt is a 24yM with hx HTN, Chronic Renal Insufficiency Stage II, Colon Cancer, Gout who presents with left ankle swelling x1.5 weeks. Recent visit to PCP on 10-02-15 believed to be due to CCB. DCed Amlodipine. On exam, pt in NAD. Nontoxic/nonseptic appearing. VSS. Afebrile. Lungs CTA. Heart RRR. Abdomen nontender soft. Low concern for DVT based on HPI, Vitals. Most likely CCB therapy as suggested by PCP. Venous doppler of LLE showed no clot. Plan is to DC home with follow up to PCP. At time of discharge, Patient is in no acute distress. Vital Signs are stable. Patient is able to ambulate. Patient able to tolerate PO.    Disposition/Plan:  DC home Additional Verbal discharge instructions  given and discussed with patient.  Pt Instructed to f/u with PCP in the next 48 hours for evaluation and treatment of symptoms. Return precautions given Pt acknowledges and agrees with plan  Supervising Physician Lajean Saver, MD   Final diagnoses:  Ankle swelling, left       Shary Decamp, PA-C 10/06/15 [place], MD 10/06/15 [phone]

## 2015-10-06 NOTE — ED Notes (Signed)
Pt reports worsening left leg/ankle swelling onset Friday; seen by PCP told to stop amlodipine and continue carvedilol.

## 2015-10-06 NOTE — ED Notes (Signed)
Awake. Verbally responsive. A/O x4. Resp even and unlabored. No audible adventitious breath sounds noted. ABC's intact.  

## 2015-10-06 NOTE — Discharge Instructions (Signed)
Please read and follow all provided instructions.  Your diagnoses today include:  1. Ankle swelling, left    Medications prescribed:   None  Home care instructions:  Follow any educational materials contained in this packet.  Follow-up instructions: Please follow-up with your primary care provider in the next week for further evaluation of symptoms and treatment   Return instructions:   Please return to the Emergency Department if you do not get better, if you get worse, or new symptoms OR  - Fever (temperature greater than 101.63F)  - Bleeding that does not stop with holding pressure to the area    -Severe pain (please note that you may be more sore the day after your accident)  - Chest Pain  - Difficulty breathing  - Severe nausea or vomiting  - Inability to tolerate food and liquids  - Passing out  - Skin becoming red around your wounds  - Change in mental status (confusion or lethargy)  - New numbness or weakness     Please return if you have any other emergent concerns.

## 2015-10-06 NOTE — Progress Notes (Signed)
VASCULAR LAB PRELIMINARY  PRELIMINARY  PRELIMINARY  PRELIMINARY  Left lower extremity venous duplex completed.    Preliminary report:  There is no DVT or SVT noted in the left lower extremity.   Charlsie Fleeger, RVT 10/06/2015, 7:27 PM

## 2015-10-08 DIAGNOSIS — Z85048 Personal history of other malignant neoplasm of rectum, rectosigmoid junction, and anus: Secondary | ICD-10-CM | POA: Diagnosis not present

## 2015-10-10 ENCOUNTER — Telehealth: Payer: Self-pay | Admitting: *Deleted

## 2015-10-10 ENCOUNTER — Other Ambulatory Visit: Payer: Self-pay | Admitting: Nurse Practitioner

## 2015-10-10 DIAGNOSIS — C20 Malignant neoplasm of rectum: Secondary | ICD-10-CM

## 2015-10-10 NOTE — Telephone Encounter (Signed)
Case was discussed in conference, PET scan is recommended to follow up lung nodule. Pt voiced understanding, will await call from schedulers.

## 2015-10-12 ENCOUNTER — Telehealth: Payer: Self-pay | Admitting: Oncology

## 2015-10-12 NOTE — Telephone Encounter (Signed)
per pof pt to have CT scan-[facility] willc all to sch-cld pt to adv needed to get contrast

## 2015-10-22 ENCOUNTER — Ambulatory Visit (HOSPITAL_COMMUNITY)
Admission: RE | Admit: 2015-10-22 | Discharge: 2015-10-22 | Disposition: A | Discharge status: Home / Self Care | Payer: Medicare Other | Source: Ambulatory Visit | Attending: Nurse Practitioner | Admitting: Nurse Practitioner

## 2015-10-22 DIAGNOSIS — R918 Other nonspecific abnormal finding of lung field: Secondary | ICD-10-CM | POA: Insufficient documentation

## 2015-10-22 DIAGNOSIS — C20 Malignant neoplasm of rectum: Secondary | ICD-10-CM | POA: Insufficient documentation

## 2015-10-22 LAB — GLUCOSE, CAPILLARY: Glucose-Capillary: 97 mg/dL (ref 65–99)

## 2015-10-22 MED ORDER — FLUDEOXYGLUCOSE F - 18 (FDG) INJECTION: 11.1000 | Freq: Once | INTRAVENOUS | Status: DC | PRN

## 2015-10-24 ENCOUNTER — Telehealth: Payer: Self-pay | Admitting: Oncology

## 2015-10-24 ENCOUNTER — Telehealth: Payer: Self-pay | Admitting: *Deleted

## 2015-10-24 NOTE — Telephone Encounter (Signed)
-----   Message from [name], MD sent at 10/23/2015  9:15 PM EDT ----- Please call patient, PET scan shows 1 hypermetabolic nodule in left lung, schedule appt. Next 2 weeks sherrill or lisa to reivew PET and decide on need for biopsy

## 2015-10-24 NOTE — Telephone Encounter (Signed)
left msg for added april appt per pof

## 2015-10-24 NOTE — Telephone Encounter (Signed)
Notified pt that one lung nodule lit up on PET. Schedulers will call with an office visit appointment to discuss results. Pt voiced understanding.

## 2015-10-25 ENCOUNTER — Telehealth: Payer: Self-pay

## 2015-10-25 DIAGNOSIS — M722 Plantar fascial fibromatosis: Secondary | ICD-10-CM | POA: Diagnosis not present

## 2015-10-25 DIAGNOSIS — M65872 Other synovitis and tenosynovitis, left ankle and foot: Secondary | ICD-10-CM | POA: Diagnosis not present

## 2015-10-25 NOTE — Telephone Encounter (Signed)
Patient called stating someone called yesterday about making an appt. Informed patient the scheduling dept called to inform him they made an appt for 11/02/15 at 3:15pm. Pt verbalized understanding

## 2015-10-30 ENCOUNTER — Ambulatory Visit (INDEPENDENT_AMBULATORY_CARE_PROVIDER_SITE_OTHER): Payer: Medicare Other | Admitting: Pulmonary Disease

## 2015-10-30 ENCOUNTER — Encounter: Payer: Self-pay | Admitting: Pulmonary Disease

## 2015-10-30 VITALS — BP 138/88 | HR 65 | Ht 68.0 in | Wt 237.0 lb

## 2015-10-30 DIAGNOSIS — E669 Obesity, unspecified: Secondary | ICD-10-CM

## 2015-10-30 DIAGNOSIS — E66811 Obesity, class 1: Secondary | ICD-10-CM

## 2015-10-30 DIAGNOSIS — G4710 Hypersomnia, unspecified: Secondary | ICD-10-CM | POA: Diagnosis not present

## 2015-10-30 DIAGNOSIS — R0609 Other forms of dyspnea: Secondary | ICD-10-CM

## 2015-10-30 DIAGNOSIS — R911 Solitary pulmonary nodule: Secondary | ICD-10-CM

## 2015-10-30 DIAGNOSIS — R0600 Dyspnea, unspecified: Secondary | ICD-10-CM

## 2015-10-30 HISTORY — DX: Dyspnea, unspecified: R06.00

## 2015-10-30 HISTORY — DX: Obesity, class 1: E66.811

## 2015-10-30 HISTORY — DX: Solitary pulmonary nodule: R91.1

## 2015-10-30 HISTORY — DX: Obesity, unspecified: E66.9

## 2015-10-30 HISTORY — DX: Other forms of dyspnea: R06.09

## 2015-10-30 HISTORY — DX: Hypersomnia, unspecified: G47.10

## 2015-10-30 NOTE — Assessment & Plan Note (Signed)
Has hypersomnia, snoring, fatigue, difficult to control blood pressure, witnessed apneas, frequent awakenings.  He sleeps around 5-6 hours per night. Naps almost daily. Hypersomnia affects functionality. It has been difficult to control his blood pressure ESS 10 Airway class IV. I can barely see the back of his mouth. Very crowded airway. Neck circ 17 inches.  Plan : 1. Ideally, in lab sleep study but the wait time is long. We will try to do a home sleep study which can be done sooner. 2. We will also try him on autocpap. He needs 1 month download and compliance make sure sleep apnea will be corrected. If not, he will need a CPAP titration study. 3. May have OHS. 4. Advised on sleep hygiene.

## 2015-10-30 NOTE — Assessment & Plan Note (Signed)
2/2 obesity/likely OHS/likely OSA/? COPD Will observe. May need PFT and ABG on f/u.

## 2015-10-30 NOTE — Patient Instructions (Signed)
1. We will schedule you for a home sleep study. We will call you with results of that in a week. 2. If study is positive, plan for autocpap. 3. Call us if with issues with cpap.  Return to clinic in 2-3 mos.

## 2015-10-30 NOTE — Assessment & Plan Note (Signed)
Patient had colon cancer in 2015. Had surgery, chemotherapy, radiation. Follows up with oncology. Recent chest CT scan which showed bigger left upper lung, medial nodule. PET scan ordered recently showed hypermetabolic left upper lung nodule. Nodule is subpleural. Difficult to access 5 bronchoscopy.  Chest ct scan (07/2015):  14 x 8 mm subpleural nodule along the lateral right upper lobe (series 5/ image 17) which is similar to 2014;  12 x 10 mm subpleural nodule in the posteromedial left upper lobe (series 5/image 19) which previously measured 10 x 8 mm and has significantly progressed from 2014 when it measured 5 mm; 9 mm nodule in the central right upper lobe (series 5/ image 24), grossly unchanged since 2014.  Plan : 1. Will await Oncology recommendation re: nodule. 2. Looking at the nodule, I anticipate it will be difficult to do bx as it is medial and posterior. Inc risk for PTX.

## 2015-10-30 NOTE — Assessment & Plan Note (Signed)
Weight reduction 

## 2015-10-30 NOTE — Progress Notes (Signed)
Subjective:    Patient ID: Jason Cotta Sr., male    DOB: Sep 12, 1945, 69 y.o.   MRN: [id number]  HPI   This is the case of Jason SNAPP Sr., 70 y.o. Male, who was referred by Dr. [name] in consultation regarding possible OSA.   As you very well know, patient has a 15 PY smoking history, quit 15 yrs ago.  Not beed dxed with asthma or copd.  Had colon CA in 2014. Had surgery, chemo, radiation. Is in remission.  Recent chest ct scan with enalrged LUL medial nodule. PET scan shows hypermetabolic LUL nodule. He is scheduled to see his Oncologist this Friday.  Anyway, pt is here for possible OSA.  Has hypersomnia, snoring, fatigue, difficult to control blood pressure, witnessed apneas, frequent awakenings.  He sleeps around 5-6 hours per night. Naps almost daily. Hypersomnia affects functionality. It has been difficult to control his blood pressure.          Review of Systems  Constitutional: Negative.  Negative for fever and unexpected weight change.       Gained 5 lbs x 1 month  HENT: Negative.  Negative for congestion, dental problem, ear pain, nosebleeds, postnasal drip, rhinorrhea, sinus pressure, sneezing, sore throat and trouble swallowing.   Eyes: Negative.  Negative for redness and itching.  Respiratory: Positive for shortness of breath. Negative for cough, chest tightness and wheezing.   Cardiovascular: Negative.  Negative for palpitations and leg swelling.  Gastrointestinal: Negative.  Negative for nausea and vomiting.  Endocrine: Negative.   Genitourinary: Negative.  Negative for dysuria.  Musculoskeletal: Negative.  Negative for joint swelling.  Skin: Negative.  Negative for rash.  Allergic/Immunologic: Negative.   Neurological: Negative.  Negative for headaches.  Hematological: Negative.  Does not bruise/bleed easily.  Psychiatric/Behavioral: Negative.  Negative for dysphoric mood. The patient is not nervous/anxious.   All other systems reviewed and are  negative.  Past Medical History  Diagnosis Date  . Gout   . Hypertension   . Hyperplastic colon polyp   . Diverticulosis   . Anemia   . Arthritis   . ED (erectile dysfunction)   . Chronic renal insufficiency, stage II (mild)   . History of radiation therapy 02/21/13-03/31/13    rectum 50.4Gy total dose  . rectal ca dx'd 02/01/13    rectal. Radiation and chemotherapy- remains on chemotherapy-next tx. 08-22-13.    (-) DVT,CVA  Family History  Problem Relation Age of Onset  . Hypertension Other   . Stroke Mother   . Hypertension Mother   . Stroke Father   . Hypertension Father   . Colon cancer Neg Hx      Past Surgical History  Procedure Laterality Date  . Colonoscopy  2010    [facility]  . Eus N/A 02/03/2013    Procedure: LOWER ENDOSCOPIC ULTRASOUND (EUS);  Surgeon: Milus Banister, MD;  Location: [facility];  Service: Endoscopy;  Laterality: N/A;  . Laparoscopic low anterior resection N/A 05/20/2013    Procedure: LAPAROSCOPIC LOW ANTERIOR RESECTION WITH SPLENIC FLEXURE MOBILIZATION;  Surgeon: Leighton Ruff, MD;  Location: [facility];  Service: General;  Laterality: N/A;  . Ostomy N/A 05/20/2013    Procedure: diverting OSTOMY;  Surgeon: Leighton Ruff, MD;  Location: [facility];  Service: General;  Laterality: N/A;  . Colon surgery  05/20/2013  . Ileostomy closure N/A 08/24/2013    Procedure: CLOSURE OF LOOP ILEOSTOMY ;  Surgeon: Leighton Ruff, MD;  Location: [facility];  Service: General;  Laterality: N/A;    Social History   Social History  . Marital Status: Married    Spouse Name: [name]  . Number of Children: 2  . Years of Education: 12   Occupational History  .      retired Insurance account manager   Social History Main Topics  . Smoking status: Former Smoker -- 0.50 packs/day for 10 years    Quit date: 07/07/1977  . Smokeless tobacco: Never Used  . Alcohol Use: No     Comment: pint of whisky every 2 weeks and occasional beer until about a month ago-Quit 10'14   . Drug Use: No  .  Sexual Activity: Not on file   Other Topics Concern  . Not on file   Social History Narrative   Married to wife, [name]   Retired Insurance account manager w/[employer]   #2 grown children and #6 grandchildren     Allergies  Allergen Reactions  . Amlodipine Other (See Comments)    Edema   . Nsaids     Asked by surgeon to add this medication class as intolerance due to patients renal insufficiency.     Outpatient Prescriptions Prior to Visit  Medication Sig Dispense Refill  . acetaminophen (TYLENOL) 500 MG tablet Take 1,000 mg by mouth every 6 (six) hours as needed for moderate pain or headache.    . allopurinol (ZYLOPRIM) 300 MG tablet TAKE ONE TABLET BY MOUTH ONCE DAILY IN THE MORNING 90 tablet 2  . carvedilol (COREG) 12.5 MG tablet Take 12.5 mg by mouth 2 (two) times daily with a meal.     . doxazosin (CARDURA) 8 MG tablet Take 1 tablet (8 mg total) by mouth at bedtime. 90 tablet 3  . furosemide (LASIX) 80 MG tablet TAKE ONE TABLET BY MOUTH ONCE DAILY 90 tablet 0  . gabapentin (NEURONTIN) 300 MG capsule Take 300-600 mg by mouth as directed. Take '300mg'$ s morning and '600mg'$ s at bedtime    . KLOR-CON M20 20 MEQ tablet Take 1 tablet (20 mEq total) by mouth once. (Patient taking differently: Take 20 mEq by mouth 2 (two) times daily. ) 90 tablet 1  . loperamide (IMODIUM) 2 MG capsule Take 1 capsule (2 mg total) by mouth as needed for diarrhea or loose stools. 30 capsule 3  . polycarbophil (FIBERCON) 625 MG tablet Take 1,250 mg by mouth daily.    . Vitamin D, Ergocalciferol, (DRISDOL) 50000 UNITS CAPS capsule Take 50,000 Units by mouth every 7 (seven) days.     No facility-administered medications prior to visit.   No orders of the defined types were placed in this encounter.         Objective:   Physical Exam  Vitals:  Filed Vitals:   10/30/15 1542  BP: 138/88  Pulse: 65  Height: '5\' 8"'$  (1.727 m)  Weight: 237 lb (107.502 kg)  SpO2: 98%     Constitutional/General:  Pleasant, well-nourished, well-developed, not in any distress,  Comfortably seating.  Well kempt morbidly obese.   Body mass index is 36.04 kg/(m^2). Wt Readings from Last 3 Encounters:  10/30/15 237 lb (107.502 kg)  10/06/15 236 lb (107.049 kg)  10/02/15 236 lb (107.049 kg)    Neck circumference: 17 in  HEENT: Pupils equal and reactive to light and accommodation. Anicteric sclerae. Normal nasal mucosa.   No oral  lesions,  mouth clear,  oropharynx clear, no postnasal drip. (-) Oral thrush. No dental caries.  Airway - Mallampati class IV  Neck: No masses. Midline trachea. No JVD, (-) LAD. (-) bruits appreciated.  Respiratory/Chest: Grossly normal chest. (-) deformity. (-) Accessory muscle use.  Symmetric expansion. (-) Tenderness on palpation.  Resonant on percussion.  Diminished BS on both lower lung zones. (-) wheezing, crackles, rhonchi (-) egophony  Cardiovascular: Regular rate and  rhythm, heart sounds normal, no murmur or gallops, no peripheral edema  Gastrointestinal:  Normal bowel sounds. Soft, non-tender. No hepatosplenomegaly.  (-) masses.   Musculoskeletal:  Normal muscle tone. Normal gait.   Extremities: Grossly normal. (-) clubbing, cyanosis.  (-) edema  Skin: (-) rash,lesions seen.   Neurological/Psychiatric : alert, oriented to time, place, person. Normal mood and affect            Assessment & Plan:  Hypersomnia Has hypersomnia, snoring, fatigue, difficult to control blood pressure, witnessed apneas, frequent awakenings.  He sleeps around 5-6 hours per night. Naps almost daily. Hypersomnia affects functionality. It has been difficult to control his blood pressure ESS 10 Airway class IV. I can barely see the back of his mouth. Very crowded airway. Neck circ 17 inches.  Plan : 1. Ideally, in lab sleep study but the wait time is long. We will try to do a home sleep study which can be done sooner. 2. We will also  try him on autocpap. He needs 1 month download and compliance make sure sleep apnea will be corrected. If not, he will need a CPAP titration study. 3. May have OHS. 4. Advised on sleep hygiene.   Lung nodule Patient had colon cancer in 2015. Had surgery, chemotherapy, radiation. Follows up with oncology. Recent chest CT scan which showed bigger left upper lung, medial nodule. PET scan ordered recently showed hypermetabolic left upper lung nodule. Nodule is subpleural. Difficult to access 5 bronchoscopy.  Chest ct scan (07/2015):  14 x 8 mm subpleural nodule along the lateral right upper lobe (series 5/ image 17) which is similar to 2014;  12 x 10 mm subpleural nodule in the posteromedial left upper lobe (series 5/image 19) which previously measured 10 x 8 mm and has significantly progressed from 2014 when it measured 5 mm; 9 mm nodule in the central right upper lobe (series 5/ image 24), grossly unchanged since 2014.  Plan : 1. Will await Oncology recommendation re: nodule. 2. Looking at the nodule, I anticipate it will be difficult to do bx as it is medial and posterior. Inc risk for PTX.   Obesity Weight reduction  Exertional dyspnea 2/2 obesity/likely OHS/likely OSA/? COPD Will observe. May need PFT and ABG on f/u.     Thank you very much for letting me participate in this patient's care. Please do not hesitate to give me a call if you have any questions or concerns regarding the treatment plan.   Patient will follow up with me in 2-81mo    J. AShirl Harris MD 10/30/2015   4:46 PM Pulmonary and CAzusaPager: [phone]: [phone] Fax: [phone]

## 2015-11-01 ENCOUNTER — Telehealth: Payer: Self-pay

## 2015-11-01 NOTE — Telephone Encounter (Signed)
Patient called to check on his about tomorrow with Dr. Benay Spice.  Writer gave patient the time and informed him the appt is with the NP.  He states that he wants to see Dr. Benay Spice tomorrow and would appreciate a call from his nurse to verify the change.

## 2015-11-01 NOTE — Telephone Encounter (Signed)
Returned call to pt, he wanted to be sure provider will discuss PET results and the lung nodule during visit. Informed him APP will address this. He will keep appt as scheduled.

## 2015-11-02 ENCOUNTER — Telehealth: Payer: Self-pay | Admitting: Oncology

## 2015-11-02 ENCOUNTER — Ambulatory Visit (HOSPITAL_BASED_OUTPATIENT_CLINIC_OR_DEPARTMENT_OTHER): Payer: Medicare Other | Admitting: Nurse Practitioner

## 2015-11-02 VITALS — BP 149/72 | HR 92 | Temp 98.9°F | Resp 20 | Ht 68.0 in | Wt 236.8 lb

## 2015-11-02 DIAGNOSIS — Z85048 Personal history of other malignant neoplasm of rectum, rectosigmoid junction, and anus: Secondary | ICD-10-CM | POA: Diagnosis not present

## 2015-11-02 DIAGNOSIS — C20 Malignant neoplasm of rectum: Secondary | ICD-10-CM

## 2015-11-02 DIAGNOSIS — R911 Solitary pulmonary nodule: Secondary | ICD-10-CM

## 2015-11-02 NOTE — Progress Notes (Addendum)
[place] OFFICE PROGRESS NOTE   Diagnosis:  Rectal cancer  INTERVAL HISTORY:   Jason [name] returns as scheduled. He feels well. He has a good appetite. No shortness of breath or cough. No change in bowel habits.  Objective:  Vital signs in last 24 hours:  Blood pressure 149/72, pulse 92, temperature 98.9 F (37.2 C), temperature source Oral, resp. rate 20, height '5\' 8"'$  (1.727 m), weight 236 lb 12.8 oz (107.412 kg), SpO2 100 %.    Resp: Lungs clear bilaterally. Cardio: Regular rate and rhythm. GI: Abdomen soft and nontender. No hepatomegaly. No mass. Vascular: No leg edema.   Lab Results:  Lab Results  Component Value Date   WBC 11.4* 08/22/2015   HGB 11.2* 08/22/2015   HCT 34.6* 08/22/2015   MCV 85.6 08/22/2015   PLT 233.0 08/22/2015   NEUTROABS 9.5* 08/22/2015    Imaging:  No results found.  Medications: I have reviewed the patient's current medications.  Assessment/Plan: 1. Rectal cancer, clinical stage III (uT3, uN2).  Initiation of concurrent Xeloda and radiation on 02/21/2013, completed 03/31/2013.   CEA normal 04/25/2013   Status post laparoscopic low anterior resection with diverting loop ileostomy 05/23/2013. Final pathology showed a 3.2 cm invasive adenocarcinoma with neoadjuvant related change. Tumor invaded into perirectal soft tissue. There was no lymphovascular or perineural invasion. 14 lymph nodes were negative for tumor. Surgical margins were negative. There was one hyperplastic polyp (ypT3, pN0).   Initiation of weekly 5-FU/leucovorin 08/01/2013   Ileostomy takedown 08/24/2013.   Weekly 5-FU/leucovorin resumed 09/22/2013.  Restaging CTs 02/20/2014 negative for evidence of recurrent disease  restaging CT scans 04/05/2015 negative for evidence of recurrent disease  Surveillance colonoscopy 03/31/2014, status post removal of of a tubular adenoma from the ascending colon 2. Markedly elevated pretreatment  CEA 3. Indeterminate pulmonary/subpleural nodules and right hepatic dome lesion. Unchanged on the CT 04/25/2013, unchanged nodular lung lesions on the CT 02/20/2014 4. Renal insufficiency. Acute on chronic renal failure during hospitalization November/December 2014. Improved following the ileostomy takedown. 5. History of Anemia secondary chemotherapy, chronic disease, and renal insufficiency 6. Hypertension. 7. Gout. 8. Prolonged postoperative ileus following laparoscopic low anterior resection with diverting loop ileostomy 05/23/2013. 9. C. difficile-positive 09/08/2013-status post Flagyl with improvement. Recurrent diarrhea following completion of Flagyl. Stool positive for C. difficile on 09/24/2013. He completed treatment with vancomycin. The diarrhea resolved. 10. Left greater than right low leg edema. Venous Doppler 09/26/2013 negative for left leg DVT. 11. Rectal urgency/incontinence-Improved with participation in the pelvic physical therapy clinic. 12. Enlarging left posterior medial upper lobe nodule on the chest CT 04/05/2015 and 07/12/2015, other lung nodules are stable. PET scan 10/22/2015 with hypermetabolic nodule in the medial aspect of the left upper lobe concerning for malignancy; stable nodule in the right upper lobe with very mild metabolic activity favoring benign etiology; stable nodular thickening in the right upper lobe without metabolic activity also likely benign. No evidence of local recurrence or metastasis in the abdomen/pelvis.   Disposition: Jason [name] appears stable. The enlarging left upper lobe nodule on chest CT 50/27/7412 was hypermetabolic on the recent PET scan. He understands the nodule could represent a focus of metastatic rectal cancer, primary lung cancer, other etiology such as infection. Dr. Benay Spice recommends a referral to Dr. Servando Snare to consider resection of the nodule. Jason [name] will be seen by Dr. Servando Snare on 11/08/2015 at 4 PM in the thoracic clinic.  He will return for a follow-up visit here in one month. He will contact  the office in the interim with any problems.  Patient seen with Dr. Benay Spice. CT and PET images reviewed on the computer with Jason [name].    Ned Card ANP/GNP-BC   11/02/2015  3:28 PM  This was a shared visit with Ned Card. We reviewed the CT and PET images with Jason [name]. I discussed the differential diagnosis with him. We will refer him to Dr. Servando Snare to consider resection of the hypermetabolic lung nodule.  Julieanne Manson, M.D.

## 2015-11-02 NOTE — Patient Instructions (Signed)
The left lung nodule was positive on the PET scan. Dr Benay Spice would like for you to see Dr Servando Snare (chest surgeon) on 5/4 at 4 pm. Please arrive at 3:45.

## 2015-11-02 NOTE — Telephone Encounter (Signed)
Gave pt appt & avs °

## 2015-11-05 DIAGNOSIS — N189 Chronic kidney disease, unspecified: Secondary | ICD-10-CM | POA: Diagnosis not present

## 2015-11-05 DIAGNOSIS — N184 Chronic kidney disease, stage 4 (severe): Secondary | ICD-10-CM | POA: Diagnosis not present

## 2015-11-05 DIAGNOSIS — N2581 Secondary hyperparathyroidism of renal origin: Secondary | ICD-10-CM | POA: Diagnosis not present

## 2015-11-05 LAB — BASIC METABOLIC PANEL: Creatinine: 3.9 mg/dL — AB (ref 0.6–1.3)

## 2015-11-06 ENCOUNTER — Encounter: Payer: Self-pay | Admitting: *Deleted

## 2015-11-06 DIAGNOSIS — R911 Solitary pulmonary nodule: Secondary | ICD-10-CM

## 2015-11-06 NOTE — Progress Notes (Signed)
Oncology Nurse Navigator Documentation  Oncology Nurse Navigator Flowsheets 11/06/2015  Navigator Encounter Type Other  Treatment Phase Pre-Tx/Tx Discussion  Barriers/Navigation Needs Coordination of Care  Interventions Coordination of Care  Acuity Level 2  Time Spent with Patient 26   Dr. Everrett Coombe office called. He is requesting Mr. Bastyr have PFT's.  I updated Dr. Benay Spice and received a vo.

## 2015-11-07 ENCOUNTER — Encounter: Payer: Self-pay | Admitting: *Deleted

## 2015-11-07 ENCOUNTER — Telehealth: Payer: Self-pay | Admitting: *Deleted

## 2015-11-07 ENCOUNTER — Encounter (HOSPITAL_COMMUNITY): Payer: Self-pay

## 2015-11-07 NOTE — Telephone Encounter (Signed)
Oncology Nurse Navigator Documentation  Oncology Nurse Navigator Flowsheets 11/07/2015  Navigator Encounter Type Telephone  Telephone Outgoing Call  Treatment Phase Pre-Tx/Tx Discussion  Barriers/Navigation Needs Coordination of Care  Interventions Coordination of Care  Coordination of Care Appts  Acuity Level 2  Time Spent with Patient 30   Called pulmonary care to get an appt for Jason [name] for his PFT's.  I then called patient gave him appt for PFT's on 11/08/15 at 2:00 [facility] and pre procedure instructions.  He verbalized understanding of appt time and place.

## 2015-11-07 NOTE — Telephone Encounter (Signed)
Oncology Nurse Navigator Documentation  Oncology Nurse Navigator Flowsheets 11/07/2015  Navigator Encounter Type Telephone  Telephone Incoming Call  Treatment Phase Pre-Tx/Tx Discussion  Barriers/Navigation Needs Coordination of Care;Education  Education Other  Interventions Coordination of Care;Education Method  Coordination of Care Appts  Education Method Verbal  Acuity Level 1  Time Spent with Patient 15   Jason [name] called today.  He had questions about his appt's tomorrow. I clarified.

## 2015-11-08 ENCOUNTER — Institutional Professional Consult (permissible substitution) (INDEPENDENT_AMBULATORY_CARE_PROVIDER_SITE_OTHER): Payer: Medicare Other | Admitting: Cardiothoracic Surgery

## 2015-11-08 ENCOUNTER — Ambulatory Visit (HOSPITAL_COMMUNITY)
Admission: RE | Admit: 2015-11-08 | Discharge: 2015-11-08 | Disposition: A | Discharge status: Home / Self Care | Payer: Medicare Other | Source: Ambulatory Visit | Attending: Oncology | Admitting: Oncology

## 2015-11-08 ENCOUNTER — Encounter: Payer: Self-pay | Admitting: *Deleted

## 2015-11-08 ENCOUNTER — Other Ambulatory Visit: Payer: Self-pay | Admitting: *Deleted

## 2015-11-08 ENCOUNTER — Ambulatory Visit: Payer: Medicare Other | Attending: Cardiothoracic Surgery | Admitting: Physical Therapy

## 2015-11-08 VITALS — BP 144/59 | HR 71 | Temp 98.1°F | Resp 19 | Ht 68.0 in | Wt 234.1 lb

## 2015-11-08 DIAGNOSIS — R911 Solitary pulmonary nodule: Secondary | ICD-10-CM | POA: Diagnosis not present

## 2015-11-08 DIAGNOSIS — R918 Other nonspecific abnormal finding of lung field: Secondary | ICD-10-CM | POA: Diagnosis not present

## 2015-11-08 DIAGNOSIS — R262 Difficulty in walking, not elsewhere classified: Secondary | ICD-10-CM | POA: Insufficient documentation

## 2015-11-08 DIAGNOSIS — M545 Low back pain, unspecified: Secondary | ICD-10-CM

## 2015-11-08 LAB — PULMONARY FUNCTION TEST
DL/VA % pred: 98 %
DL/VA: 4.42 ml/min/mmHg/L
DLCO UNC: 17.2 ml/min/mmHg
DLCO unc % pred: 58 %
FEF 25-75 Post: 1.33 L/sec
FEF 25-75 Pre: 1.73 L/sec
FEF2575-%CHANGE-POST: -22 %
FEF2575-%PRED-POST: 57 %
FEF2575-%Pred-Pre: 73 %
FEV1-%CHANGE-POST: -5 %
FEV1-%PRED-PRE: 65 %
FEV1-%Pred-Post: 61 %
FEV1-POST: 1.65 L
FEV1-PRE: 1.74 L
FEV1FVC-%Change-Post: -3 %
FEV1FVC-%Pred-Pre: 104 %
FEV6-%Change-Post: -1 %
FEV6-%PRED-POST: 62 %
FEV6-%Pred-Pre: 63 %
FEV6-PRE: 2.17 L
FEV6-Post: 2.13 L
FEV6FVC-%CHANGE-POST: 0 %
FEV6FVC-%PRED-PRE: 104 %
FEV6FVC-%Pred-Post: 105 %
FVC-%CHANGE-POST: -1 %
FVC-%PRED-POST: 59 %
FVC-%Pred-Pre: 61 %
FVC-POST: 2.13 L
FVC-Pre: 2.17 L
POST FEV1/FVC RATIO: 78 %
PRE FEV6/FVC RATIO: 100 %
Post FEV6/FVC ratio: 100 %
Pre FEV1/FVC ratio: 80 %
RV % PRED: 132 %
RV: 3.07 L
TLC % pred: 78 %
TLC: 5.2 L

## 2015-11-08 MED ORDER — ALBUTEROL SULFATE (2.5 MG/3ML) 0.083% IN NEBU
2.5000 mg | INHALATION_SOLUTION | Freq: Once | RESPIRATORY_TRACT | Status: AC
  Administered 2015-11-08: 2.5 mg via RESPIRATORY_TRACT

## 2015-11-08 NOTE — Patient Instructions (Signed)
Pulmonary Nodule A pulmonary nodule is a small, round growth of tissue in the lung. Pulmonary nodules can range in size from less than 1/5 inch (4 mm) to a little bigger than an inch (25 mm). Most pulmonary nodules are detected when imaging tests of the lung are being performed for a different problem. Pulmonary nodules are usually not cancerous (benign). However, some pulmonary nodules are cancerous (malignant). Follow-up treatment or testing is based on the size of the pulmonary nodule and your risk of getting lung cancer.  CAUSES Benign pulmonary nodules can be caused by various things. Some of the causes include:   Bacterial, fungal, or viral infections. This is usually an old infection that is no longer active, but it can sometimes be a current, active infection.  A benign mass of tissue.  Inflammation from conditions such as rheumatoid arthritis.   Abnormal blood vessels in the lungs. Malignant pulmonary nodules can result from lung cancer or from cancers that spread to the lung from other places in the body. SIGNS AND SYMPTOMS Pulmonary nodules usually do not cause symptoms. DIAGNOSIS Most often, pulmonary nodules are found incidentally when an X-ray or CT scan is performed to look for some other problem in the lung area. To help determine whether a pulmonary nodule is benign or malignant, your health care provider will take a medical history and order a variety of tests. Tests done may include:   Blood tests.  A skin test called a tuberculin test. This test is used to determine if you have been exposed to the germ that causes tuberculosis.   Chest X-rays. If possible, a new X-ray may be compared with X-rays you have had in the past.   CT scan. This test shows smaller pulmonary nodules more clearly than an X-ray.   Positron emission tomography (PET) scan. In this test, a safe amount of a radioactive substance is injected into the bloodstream. Then, the scan takes a picture of  the pulmonary nodule. The radioactive substance is eliminated from your body in your urine.   Biopsy. A tiny piece of the pulmonary nodule is removed so it can be checked under a microscope. TREATMENT  Pulmonary nodules that are benign normally do not require any treatment because they usually do not cause symptoms or breathing problems. Your health care provider may want to monitor the pulmonary nodule through follow-up CT scans. The frequency of these CT scans will vary based on the size of the nodule and the risk factors for lung cancer. For example, CT scans will need to be done more frequently if the pulmonary nodule is larger and if you have a history of smoking and a family history of cancer. Further testing or biopsies may be done if any follow-up CT scan shows that the size of the pulmonary nodule has increased. HOME CARE INSTRUCTIONS  Only take over-the-counter or prescription medicines as directed by your health care provider.  Keep all follow-up appointments with your health care provider. SEEK MEDICAL CARE IF:  You have trouble breathing when you are active.   You feel sick or unusually tired.   You do not feel like eating.   You lose weight without trying to.   You develop chills or night sweats.  SEEK IMMEDIATE MEDICAL CARE IF:  You cannot catch your breath, or you begin wheezing.   You cannot stop coughing.   You cough up blood.   You become dizzy or feel like you are going to pass out.   You   have sudden chest pain.   You have a fever or persistent symptoms for more than 2-3 days.   You have a fever and your symptoms suddenly get worse. MAKE SURE YOU:  Understand these instructions.  Will watch your condition.  Will get help right away if you are not doing well or get worse.   This information is not intended to replace advice given to you by your health care provider. Make sure you discuss any questions you have with your health care  provider.   Document Released: 04/20/2009 Document Revised: 02/23/2013 Document Reviewed: 12/13/2012 Elsevier Interactive Patient Education 2016 [employer] Lung Resection A lung resection is a procedure to remove part or all of a lung. When an entire lung is removed, the procedure is called a pneumonectomy. When only part of a lung is removed, the procedure is called a lobectomy. A lung resection is typically done to get rid of a tumor or cancer, but it may be done to treat other conditions. This procedure can help relieve some or all of your symptoms and can also help keep the problem from getting worse. Lung resection may provide the best chance for curing your disease. However, the procedure may not necessarily cure lung cancer if that is the problem.  LET [facility] CARE PROVIDER KNOW ABOUT:   Any allergies you have.  All medicines you are taking, including vitamins, herbs, eye drops, creams, and over-the-counter medicines.  Previous problems you or members of your family have had with the use of anesthetics.  Any blood disorders you have.  Previous surgeries you have had.  Medical conditions you have. RISKS AND COMPLICATIONS  Generally, lung resection is a safe procedure. However, problems can occur and include:  Excessive bleeding.  Infection.  Inability to breathe without a ventilator.  Persistent shortness of breath.  Heart problems, including abnormal rhythms and a risk of heart attack or heart failure.  Blood clots.  Injury to a blood vessel.  Injury to a nerve.  Failure to heal properly.  Stroke.  Bronchopleural fistula. This is a small hole between one of the main breathing tubes (bronchus) and the lining of the lungs. This is rare.  Reaction to anesthesia. BEFORE THE PROCEDURE  You may have tests done before the procedure, including:  Blood tests.  Urine tests.  X-rays.  Other imaging tests (such as CT scans, MRI scans, and PET scans). These  tests are done to find the exact size and location of the condition being treated with this surgery.  Pulmonary function tests. These are breathing tests to assess the function of your lungs before surgery and to decide how to best help your breathing after surgery.  Heart testing. This is done to make sure your heart is strong enough for the procedure.  Bronchoscopy. This is a technique that allows your health care provider to look at the inside of your airways. This is done using a soft, flexible tube (bronchoscope). Along with imaging tests, this can help your health care provider know the exact location and size of the area that will be removed during surgery.  Lymph node sampling. This may need to be done to see if the tumor has spread. It may be done as a separate surgery or right before your lung resection procedure. PROCEDURE  An IV tube will be placed in your arm. You will be given a medicine that makes you fall asleep (general anesthetic). You may also get pain medicine through a thin, flexible tube (  catheter) in your back.  A breathing tube will be placed in your throat.  Once the surgical team has prepared you for surgery, your surgeon will make an incision on your side. Some resections are done through large incisions, while others can be done through small incisions using smaller instruments and assisted with small cameras (laparoscopic surgery).  Your surgeon will carefully cut the veins, arteries, and bronchus leading to your lung. After being cut, each of these pieces will be sewn or stapled closed. The lung or part of the lung will then be removed.  Your surgeon will check inside your chest to make sure there is no bleeding in or around the lungs. Lymph nodes near the lung may also be removed for later tests.  Your surgeon may put tubes into your chest to drain extra fluid and air after surgery.  Your incision will be closed. This may be done using:  Stitches that absorb  into your body and do not need to be removed.  Stitches that must be removed.  Staples that must be removed. AFTER THE PROCEDURE   You will be taken to the recovery area and your progress will be monitored. You may still have a breathing tube and other tubes or catheters in your body immediately after surgery. These will be removed during your recovery. You may be put on a respirator following surgery if some assistance is needed to help your breathing. When you are awake and not experiencing immediate problems from surgery, you will be moved to the intensive care unit (ICU) where you will continue your recovery.  You may feel pain in your chest and throat. Sometimes during recovery, patients may shiver or feel nauseous. You will be given medicine to help with pain and nausea.  The breathing tube will be taken out as soon as your health care providers feel you can breathe on your own. For most people, this happens on the same day as the surgery.  If your surgery and time in the ICU go well, most of the tubes and equipment will be taken out within 1-2 days after surgery. This is about how long most people stay in the ICU. You may need to stay longer, depending on how you are doing.  You should also start respiratory therapy in the ICU. This therapy uses breathing exercises to help your other lung stay healthy and get stronger.  As you improve, you will be moved to a regular hospital room for continued respiratory therapy, help with your bladder and bowels, and to continue medicines.  After your lung or part of your lung is taken out, there will be a space inside your chest. This space will often fill up with fluid over time. The amount of time this takes is different for each person.  You will receive care until you are doing well and your health care provider feels it is safe for you to go home or to transfer to an extended care facility.   This information is not intended to replace advice  given to you by your health care provider. Make sure you discuss any questions you have with your health care provider.   Document Released: 09/13/2002 Document Revised: 07/14/2014 Document Reviewed: 08/12/2013 Elsevier Interactive Patient Education 2016 [place]. Lung Resection, Care After Refer to this sheet in the next few weeks. These instructions provide you with information on caring for yourself after your procedure. Your health care provider may also give you more specific instructions. Your treatment  has been planned according to current medical practices, but problems sometimes occur. Call your health care provider if you have any problems or questions after your procedure. WHAT TO EXPECT AFTER THE PROCEDURE After your procedure, it is typical to have the following:   You may feel pain in your chest and throat.  Patients may sometimes shiver or feel nauseous during recovery. HOME CARE INSTRUCTIONS  You may resume a normal diet and activities as directed by your health care provider.  Do not use any tobacco products, including cigarettes, chewing tobacco, or electronic cigarettes. If you need help quitting, ask your health care provider.  There are many different ways to close and cover an incision, including stitches, skin glue, and adhesive strips. Follow your health care provider's instructions on:  Incision care.  Bandage (dressing) changes and removal.  Incision closure removal.  Take medicines only as directed by your health care provider.  Keep all follow-up visits as directed by your health care provider. This is important.  Try to breathe deeply and cough as directed. Holding a pillow firmly over your ribs may help with discomfort.  If you were given an incentive spirometer in the hospital, continue to use it as directed by your health care provider.  Walk as directed by your health care provider.  You may take a shower and gently wash the area of your  incision with water and soap as directed by your health care provider. Do not use anything else to clean your incision except as directed by your health care provider. Do not take baths, swim, or use a hot tub until your health care provider approves. SEEK MEDICAL CARE IF:  You notice redness, swelling, or increasing pain at the incision site.  You are bleeding at the incision site.  You see pus coming from the incision site.  You notice a bad smell coming from the incision site or bandage.  Your incision breaks open.  You cough up blood or pus, or you develop a cough that produces bad-smelling sputum.  You have pain or swelling in your legs.  You have increasing pain that is not controlled with medicine.  You have trouble managing any of the tubes that have been left in place after surgery.  You have fever or chills. SEEK IMMEDIATE MEDICAL CARE IF:   You have chest pain or an irregular or rapid heartbeat.  You have dizzy episodes or faint.  You have shortness of breath or difficulty breathing.  You have persistent nausea or vomiting.  You have a rash.   This information is not intended to replace advice given to you by your health care provider. Make sure you discuss any questions you have with your health care provider.   Document Released: 01/10/2005 Document Revised: 07/14/2014 Document Reviewed: 08/12/2013 Elsevier Interactive Patient Education [employer].

## 2015-11-08 NOTE — Therapy (Signed)
[place], [place], [address] Phone: [phone]   Fax:  [phone]  Physical Therapy Evaluation  Patient Details  Name: Jason DEGEORGE Sr. MRN: [id number] Date of Birth: 1945-11-08 Referring Provider: Dr. [name]  Encounter Date: 11/08/2015      PT End of Session - 11/08/15 1654    Visit Number 1   Number of Visits 1   PT Start Time 7741   PT Stop Time 1655   PT Time Calculation (min) 17 min   Activity Tolerance Patient tolerated treatment well   Behavior During Therapy [facility] for tasks assessed/performed      Past Medical History  Diagnosis Date  . Gout   . Hypertension   . Hyperplastic colon polyp   . Diverticulosis   . Anemia   . Arthritis   . ED (erectile dysfunction)   . Chronic renal insufficiency, stage II (mild)   . History of radiation therapy 02/21/13-03/31/13    rectum 50.4Gy total dose  . rectal ca dx'd 02/01/13    rectal. Radiation and chemotherapy- remains on chemotherapy-next tx. 08-22-13.     Past Surgical History  Procedure Laterality Date  . Colonoscopy  2010    [facility]  . Eus N/A 02/03/2013    Procedure: LOWER ENDOSCOPIC ULTRASOUND (EUS);  Surgeon: Milus Banister, MD;  Location: [facility];  Service: Endoscopy;  Laterality: N/A;  . Laparoscopic low anterior resection N/A 05/20/2013    Procedure: LAPAROSCOPIC LOW ANTERIOR RESECTION WITH SPLENIC FLEXURE MOBILIZATION;  Surgeon: Leighton Ruff, MD;  Location: [facility];  Service: General;  Laterality: N/A;  . Ostomy N/A 05/20/2013    Procedure: diverting OSTOMY;  Surgeon: Leighton Ruff, MD;  Location: [facility];  Service: General;  Laterality: N/A;  . Colon surgery  05/20/2013  . Ileostomy closure N/A 08/24/2013    Procedure: CLOSURE OF LOOP ILEOSTOMY ;  Surgeon: Leighton Ruff, MD;  Location: [facility];  Service: General;  Laterality: N/A;    There were no vitals filed for this visit.       Subjective Assessment -  11/08/15 1608    Subjective reports a bad disk in his back that limits his endurance for walking and bothers him from time to time   Patient is accompained by: Family member  wife   Pertinent History Pt. had been followed due to h/o lung nodule noted 2014 when being worked up for colorectal cancer (stage III, s/p chemo/XRT, surgery in 2014).  CT chest 07/12/15, PET 07/24/15.  He is expected to have surgery or SBRT for this.  Ex-smoker quit 1979.  HTN, arthritis, stage II renal insufficiency.   Patient Stated Goals get info from all lung clinic providers   Currently in Pain? No/denies            [facility] PT Assessment - 11/08/15 0001    Assessment   Medical Diagnosis adenocarcinoma, unsure if colorectal or lung   Referring Provider Dr. [name]   Onset Date/Surgical Date 07/12/15   Precautions   Precautions Other (comment)   Precaution Comments cancer precautions   Restrictions   Weight Bearing Restrictions No   Balance Screen   Has the patient fallen in the past 6 months No   Has the patient had a decrease in activity level because of a fear of falling?  No   Is the patient reluctant to leave their home because of a fear of falling?  No   Home Environment   Living Environment  Private residence   Living Arrangements Spouse/significant other   Type of [place] One level   Prior Function   Level of Independence Independent   Leisure rides a stationary bike 30 minutes every day   Cognition   Overall Cognitive Status Within Functional Limits for tasks assessed   Functional Tests   Functional tests Sit to Stand   Sit to Stand   Comments 12 times in 30 seconds, WFL for age, but pt. with moderate dyspnea   Posture/Postural Control   Posture/Postural Control Postural limitations   Postural Limitations Rounded Shoulders;Forward head   ROM / Strength   AROM / PROM / Strength AROM   AROM   Overall AROM Comments In standing, trunk flexion WFL and extension 50% loss.    Ambulation/Gait   Ambulation/Gait Yes   Ambulation/Gait Assistance 6: Modified independent (Device/Increase time)  reports being limited by back disk pain                           PT Education - 11/08/15 1615    Education provided Yes   Education Details posture, breathing, energy conservation, walking, Cure article on staying active, PT info   Person(s) Educated Patient;Spouse   Methods Handout   Comprehension Verbalized understanding;Need further instruction               Lung Clinic Goals - 11/08/15 1701    Patient will be able to verbalize understanding of the benefit of exercise to decrease fatigue.   Status Partially Met   Patient will be able to verbalize the importance of posture.   Status Partially Met   Patient will be able to demonstrate diaphragmatic breathing for improved lung function.   Status Partially Met   Patient will be able to verbalize understanding of the role of physical therapy to prevent functional decline and who to contact if physical therapy is needed.   Status Partially Met             Plan - 11/08/15 1655    Clinical Impression Statement Patient with h/o colon cancer now with lung nodule; expected to have surgery or SBRT.  He has forward head, rounded shoulder posture with protruding abdomen; h/o back disk with pain limiting ambulation distance; moderate dsypnea with 12 reps of sit to/from stand in 30 seconds.  Patient had to leave before all of education could be performed today.     Rehab Potential Good   PT Frequency One time visit   PT Treatment/Interventions Patient/family education   PT Next Visit Plan None at this time.   PT Home Exercise Plan see education section   Consulted and Agree with Plan of Care Patient      Patient will benefit from skilled therapeutic intervention in order to improve the following deficits and impairments:  Cardiopulmonary status limiting activity  Visit Diagnosis: Difficulty  in walking, not elsewhere classified - Plan: PT plan of care cert/re-cert  Midline low back pain without sciatica - Plan: PT plan of care cert/re-cert      G-Codes - 32/67/12 1701    Functional Assessment Tool Used clinical judgement   Functional Limitation Mobility: Walking and moving around   Mobility: Walking and Moving Around Current Status (W5809) At least 1 percent but less than 20 percent impaired, limited or restricted   Mobility: Walking and Moving Around Goal Status (X8338) At least 1 percent but less than 20 percent impaired, limited or restricted  Mobility: Walking and Moving Around Discharge Status [phone]) At least 1 percent but less than 20 percent impaired, limited or restricted       Problem List Patient Active Problem List   Diagnosis Date Noted  . Hypersomnia 10/30/2015  . Lung nodule 10/30/2015  . Obesity 10/30/2015  . Exertional dyspnea 10/30/2015  . Routine general medical examination at a health care facility 06/04/2015  . Medicare annual wellness visit, subsequent 06/04/2015  . Hereditary and idiopathic peripheral neuropathy 09/14/2014  . Peripheral edema 09/19/2013  . Rectal cancer ([place]) 02/01/2013  . Pernicious anemia 07/20/2012  . Chronic renal insufficiency 07/31/2011  . Obstructive sleep apnea 07/29/2010  . GOUT 05/11/2007  . ERECTILE DYSFUNCTION, MILD 05/11/2007  . Essential hypertension 05/11/2007  . COLONIC POLYPS, HX OF 05/11/2007    SALISBURY,DONNA 11/08/2015, 5:04 PM  [place], [place], [address] Phone: [phone]   Fax:  [phone]  Name: Jason JIVIDEN Sr. MRN: [id number] Date of Birth: 12-28-1945   Serafina Royals, PT 11/08/2015 5:04 PM

## 2015-11-08 NOTE — Patient Instructions (Signed)
.Pulmonary Nodule A pulmonary nodule is a small, round growth of tissue in the lung. Pulmonary nodules can range in size from less than 1/5 inch (4 mm) to a little bigger than an inch (25 mm). Most pulmonary nodules are detected when imaging tests of the lung are being performed for a different problem. Pulmonary nodules are usually not cancerous (benign). However, some pulmonary nodules are cancerous (malignant). Follow-up treatment or testing is based on the size of the pulmonary nodule and your risk of getting lung cancer.  CAUSES Benign pulmonary nodules can be caused by various things. Some of the causes include:   Bacterial, fungal, or viral infections. This is usually an old infection that is no longer active, but it can sometimes be a current, active infection.  A benign mass of tissue.  Inflammation from conditions such as rheumatoid arthritis.   Abnormal blood vessels in the lungs. Malignant pulmonary nodules can result from lung cancer or from cancers that spread to the lung from other places in the body. SIGNS AND SYMPTOMS Pulmonary nodules usually do not cause symptoms. DIAGNOSIS Most often, pulmonary nodules are found incidentally when an X-ray or CT scan is performed to look for some other problem in the lung area. To help determine whether a pulmonary nodule is benign or malignant, your health care provider will take a medical history and order a variety of tests. Tests done may include:   Blood tests.  A skin test called a tuberculin test. This test is used to determine if you have been exposed to the germ that causes tuberculosis.   Chest X-rays. If possible, a new X-ray may be compared with X-rays you have had in the past.   CT scan. This test shows smaller pulmonary nodules more clearly than an X-ray.   Positron emission tomography (PET) scan. In this test, a safe amount of a radioactive substance is injected into the bloodstream. Then, the scan takes a picture of  the pulmonary nodule. The radioactive substance is eliminated from your body in your urine.   Biopsy. A tiny piece of the pulmonary nodule is removed so it can be checked under a microscope. TREATMENT  Pulmonary nodules that are benign normally do not require any treatment because they usually do not cause symptoms or breathing problems. Your health care provider may want to monitor the pulmonary nodule through follow-up CT scans. The frequency of these CT scans will vary based on the size of the nodule and the risk factors for lung cancer. For example, CT scans will need to be done more frequently if the pulmonary nodule is larger and if you have a history of smoking and a family history of cancer. Further testing or biopsies may be done if any follow-up CT scan shows that the size of the pulmonary nodule has increased. HOME CARE INSTRUCTIONS  Only take over-the-counter or prescription medicines as directed by your health care provider.  Keep all follow-up appointments with your health care provider. SEEK MEDICAL CARE IF:  You have trouble breathing when you are active.   You feel sick or unusually tired.   You do not feel like eating.   You lose weight without trying to.   You develop chills or night sweats.  SEEK IMMEDIATE MEDICAL CARE IF:  You cannot catch your breath, or you begin wheezing.   You cannot stop coughing.   You cough up blood.   You become dizzy or feel like you are going to pass out.   You  have sudden chest pain.   You have a fever or persistent symptoms for more than 2-3 days.   You have a fever and your symptoms suddenly get worse. MAKE SURE YOU:  Understand these instructions.  Will watch your condition.  Will get help right away if you are not doing well or get worse.   This information is not intended to replace advice given to you by your health care provider. Make sure you discuss any questions you have with your health care  provider.   Document Released: 04/20/2009 Document Revised: 02/23/2013 Document Reviewed: 12/13/2012 Elsevier Interactive Patient Education 2016 [employer]  Lung Resection A lung resection is a procedure to remove part or all of a lung. When an entire lung is removed, the procedure is called a pneumonectomy. When only part of a lung is removed, the procedure is called a lobectomy. A lung resection is typically done to get rid of a tumor or cancer, but it may be done to treat other conditions. This procedure can help relieve some or all of your symptoms and can also help keep the problem from getting worse. Lung resection may provide the best chance for curing your disease. However, the procedure may not necessarily cure lung cancer if that is the problem.  LET [facility] CARE PROVIDER KNOW ABOUT:   Any allergies you have.  All medicines you are taking, including vitamins, herbs, eye drops, creams, and over-the-counter medicines.  Previous problems you or members of your family have had with the use of anesthetics.  Any blood disorders you have.  Previous surgeries you have had.  Medical conditions you have. RISKS AND COMPLICATIONS  Generally, lung resection is a safe procedure. However, problems can occur and include:  Excessive bleeding.  Infection.  Inability to breathe without a ventilator.  Persistent shortness of breath.  Heart problems, including abnormal rhythms and a risk of heart attack or heart failure.  Blood clots.  Injury to a blood vessel.  Injury to a nerve.  Failure to heal properly.  Stroke.  Bronchopleural fistula. This is a small hole between one of the main breathing tubes (bronchus) and the lining of the lungs. This is rare.  Reaction to anesthesia. BEFORE THE PROCEDURE  You may have tests done before the procedure, including:  Blood tests.  Urine tests.  X-rays.  Other imaging tests (such as CT scans, MRI scans, and PET scans). These  tests are done to find the exact size and location of the condition being treated with this surgery.  Pulmonary function tests. These are breathing tests to assess the function of your lungs before surgery and to decide how to best help your breathing after surgery.  Heart testing. This is done to make sure your heart is strong enough for the procedure.  Bronchoscopy. This is a technique that allows your health care provider to look at the inside of your airways. This is done using a soft, flexible tube (bronchoscope). Along with imaging tests, this can help your health care provider know the exact location and size of the area that will be removed during surgery.  Lymph node sampling. This may need to be done to see if the tumor has spread. It may be done as a separate surgery or right before your lung resection procedure. PROCEDURE  An IV tube will be placed in your arm. You will be given a medicine that makes you fall asleep (general anesthetic). You may also get pain medicine through a thin, flexible  tube (catheter) in your back.  A breathing tube will be placed in your throat.  Once the surgical team has prepared you for surgery, your surgeon will make an incision on your side. Some resections are done through large incisions, while others can be done through small incisions using smaller instruments and assisted with small cameras (laparoscopic surgery).  Your surgeon will carefully cut the veins, arteries, and bronchus leading to your lung. After being cut, each of these pieces will be sewn or stapled closed. The lung or part of the lung will then be removed.  Your surgeon will check inside your chest to make sure there is no bleeding in or around the lungs. Lymph nodes near the lung may also be removed for later tests.  Your surgeon may put tubes into your chest to drain extra fluid and air after surgery.  Your incision will be closed. This may be done using:  Stitches that absorb  into your body and do not need to be removed.  Stitches that must be removed.  Staples that must be removed. AFTER THE PROCEDURE   You will be taken to the recovery area and your progress will be monitored. You may still have a breathing tube and other tubes or catheters in your body immediately after surgery. These will be removed during your recovery. You may be put on a respirator following surgery if some assistance is needed to help your breathing. When you are awake and not experiencing immediate problems from surgery, you will be moved to the intensive care unit (ICU) where you will continue your recovery.  You may feel pain in your chest and throat. Sometimes during recovery, patients may shiver or feel nauseous. You will be given medicine to help with pain and nausea.  The breathing tube will be taken out as soon as your health care providers feel you can breathe on your own. For most people, this happens on the same day as the surgery.  If your surgery and time in the ICU go well, most of the tubes and equipment will be taken out within 1-2 days after surgery. This is about how long most people stay in the ICU. You may need to stay longer, depending on how you are doing.  You should also start respiratory therapy in the ICU. This therapy uses breathing exercises to help your other lung stay healthy and get stronger.  As you improve, you will be moved to a regular hospital room for continued respiratory therapy, help with your bladder and bowels, and to continue medicines.  After your lung or part of your lung is taken out, there will be a space inside your chest. This space will often fill up with fluid over time. The amount of time this takes is different for each person.  You will receive care until you are doing well and your health care provider feels it is safe for you to go home or to transfer to an extended care facility.   This information is not intended to replace advice  given to you by your health care provider. Make sure you discuss any questions you have with your health care provider.   Document Released: 09/13/2002 Document Revised: 07/14/2014 Document Reviewed: 08/12/2013 Elsevier Interactive Patient Education 2016 [employer].  Lung Resection, Care After Refer to this sheet in the next few weeks. These instructions provide you with information on caring for yourself after your procedure. Your health care provider may also give you more specific instructions.  Your treatment has been planned according to current medical practices, but problems sometimes occur. Call your health care provider if you have any problems or questions after your procedure. WHAT TO EXPECT AFTER THE PROCEDURE After your procedure, it is typical to have the following:   You may feel pain in your chest and throat.  Patients may sometimes shiver or feel nauseous during recovery. HOME CARE INSTRUCTIONS  You may resume a normal diet and activities as directed by your health care provider.  Do not use any tobacco products, including cigarettes, chewing tobacco, or electronic cigarettes. If you need help quitting, ask your health care provider.  There are many different ways to close and cover an incision, including stitches, skin glue, and adhesive strips. Follow your health care provider's instructions on:  Incision care.  Bandage (dressing) changes and removal.  Incision closure removal.  Take medicines only as directed by your health care provider.  Keep all follow-up visits as directed by your health care provider. This is important.  Try to breathe deeply and cough as directed. Holding a pillow firmly over your ribs may help with discomfort.  If you were given an incentive spirometer in the hospital, continue to use it as directed by your health care provider.  Walk as directed by your health care provider.  You may take a shower and gently wash the area of your  incision with water and soap as directed by your health care provider. Do not use anything else to clean your incision except as directed by your health care provider. Do not take baths, swim, or use a hot tub until your health care provider approves. SEEK MEDICAL CARE IF:  You notice redness, swelling, or increasing pain at the incision site.  You are bleeding at the incision site.  You see pus coming from the incision site.  You notice a bad smell coming from the incision site or bandage.  Your incision breaks open.  You cough up blood or pus, or you develop a cough that produces bad-smelling sputum.  You have pain or swelling in your legs.  You have increasing pain that is not controlled with medicine.  You have trouble managing any of the tubes that have been left in place after surgery.  You have fever or chills. SEEK IMMEDIATE MEDICAL CARE IF:   You have chest pain or an irregular or rapid heartbeat.  You have dizzy episodes or faint.  You have shortness of breath or difficulty breathing.  You have persistent nausea or vomiting.  You have a rash.   This information is not intended to replace advice given to you by your health care provider. Make sure you discuss any questions you have with your health care provider.   Document Released: 01/10/2005 Document Revised: 07/14/2014 Document Reviewed: 08/12/2013 Elsevier Interactive Patient Education [employer].

## 2015-11-08 NOTE — Progress Notes (Signed)
[address]       [place] [address]             [phone]                    [name] [facility] Medical Record #[id number] Date of Birth: 03/04/1946  Referring: [name], MD Primary Care: [name], MD  Chief Complaint:   No chief complaint on file.   History of Present Illness:    Jason PRICHARD Sr. 70 y.o. male is seen in the office  today for history of Rectal cancer, clinical stage III (uT3, uN2). And enlarging left upper lobe lung nodule   restaging CT scans 04/05/2015 revealed enlargement of a subpleural nodule in the posterior medial left upper lobe measuring 1.2 cm compared to a previous measurement of 0.8 cm. No evidence of metastatic rectal cancer.  A repeat chest CT on 07/12/2015 found the posterior medial left upper lobe nodule to be mildly increased   Current Activity/ Functional Status:  Patient is independent with mobility/ambulation, transfers, ADL's, IADL's.   Zubrod Score: At the time of surgery this patient's most appropriate activity status/level should be described as: '[]'$     0    Normal activity, no symptoms '[x]'$     1    Restricted in physical strenuous activity but ambulatory, able to do out light work '[]'$     2    Ambulatory and capable of self care, unable to do work activities, up and about               >50 % of waking hours                              '[]'$     3    Only limited self care, in bed greater than 50% of waking hours '[]'$     4    Completely disabled, no self care, confined to bed or chair '[]'$     5    Moribund   Past Medical History  Diagnosis Date  . Gout   . Hypertension   . Hyperplastic colon polyp   . Diverticulosis   . Anemia   . Arthritis   . ED (erectile dysfunction)   . Chronic renal insufficiency, stage II (mild)   . History of radiation therapy 02/21/13-03/31/13    rectum 50.4Gy total dose  . rectal ca dx'd 02/01/13    rectal. Radiation and chemotherapy- remains on chemotherapy-next  tx. 08-22-13.     Past Surgical History  Procedure Laterality Date  . Colonoscopy  2010    [facility]  . Eus N/A 02/03/2013    Procedure: LOWER ENDOSCOPIC ULTRASOUND (EUS);  Surgeon: Milus Banister, MD;  Location: [facility];  Service: Endoscopy;  Laterality: N/A;  . Laparoscopic low anterior resection N/A 05/20/2013    Procedure: LAPAROSCOPIC LOW ANTERIOR RESECTION WITH SPLENIC FLEXURE MOBILIZATION;  Surgeon: Leighton Ruff, MD;  Location: [facility];  Service: General;  Laterality: N/A;  . Ostomy N/A 05/20/2013    Procedure: diverting OSTOMY;  Surgeon: Leighton Ruff, MD;  Location: [facility];  Service: General;  Laterality: N/A;  . Colon surgery  05/20/2013  . Ileostomy closure N/A 08/24/2013    Procedure: CLOSURE OF LOOP ILEOSTOMY ;  Surgeon: Leighton Ruff, MD;  Location: [facility];  Service: General;  Laterality: N/A;    Family History  Problem Relation Age  of Onset  . Hypertension Other   . Stroke Mother   . Hypertension Mother   . Stroke Father   . Hypertension Father   . Colon cancer Neg Hx     Social History   Social History  . Marital Status: Married    Spouse Name: [name]  . Number of Children: 2  . Years of Education: 12   Occupational History  .      retired Insurance account manager   Social History Main Topics  . Smoking status: Former Smoker -- 0.50 packs/day for 10 years    Quit date: 07/07/1977  . Smokeless tobacco: Never Used  . Alcohol Use: No     Comment: pint of whisky every 2 weeks and occasional beer until about a month ago-Quit 10'14   . Drug Use: No  . Sexual Activity: Not on file   Other Topics Concern  . Not on file   Social History Narrative   Married to wife, [name]   Retired Insurance account manager w/[employer]   #2 grown children and #6 grandchildren    History  Smoking status  . Former Smoker -- 0.50 packs/day for 10 years  . Quit date: 07/07/1977  Smokeless tobacco  . Never Used    History  Alcohol Use No    Comment: pint  of whisky every 2 weeks and occasional beer until about a month ago-Quit 10'14      Allergies  Allergen Reactions  . Amlodipine Other (See Comments)    Edema   . Nsaids     Asked by surgeon to add this medication class as intolerance due to patients renal insufficiency.    Current Outpatient Prescriptions  Medication Sig Dispense Refill  . acetaminophen (TYLENOL) 500 MG tablet Take 1,000 mg by mouth every 6 (six) hours as needed for moderate pain or headache.    . allopurinol (ZYLOPRIM) 300 MG tablet TAKE ONE TABLET BY MOUTH ONCE DAILY IN THE MORNING 90 tablet 2  . carvedilol (COREG) 12.5 MG tablet Take 12.5 mg by mouth 2 (two) times daily with a meal.     . doxazosin (CARDURA) 8 MG tablet Take 1 tablet (8 mg total) by mouth at bedtime. 90 tablet 3  . furosemide (LASIX) 80 MG tablet TAKE ONE TABLET BY MOUTH ONCE DAILY 90 tablet 0  . gabapentin (NEURONTIN) 300 MG capsule Take 300-600 mg by mouth as directed. Take '300mg'$ s morning and '600mg'$ s at bedtime    . KLOR-CON M20 20 MEQ tablet Take 1 tablet (20 mEq total) by mouth once. (Patient taking differently: Take 20 mEq by mouth 2 (two) times daily. ) 90 tablet 1  . loperamide (IMODIUM) 2 MG capsule Take 1 capsule (2 mg total) by mouth as needed for diarrhea or loose stools. 30 capsule 3  . polycarbophil (FIBERCON) 625 MG tablet Take 1,250 mg by mouth daily.    . Vitamin D, Ergocalciferol, (DRISDOL) 50000 UNITS CAPS capsule Take 50,000 Units by mouth every 7 (seven) days.     No current facility-administered medications for this visit.      Review of Systems:     Cardiac Review of Systems: Y or N  Chest Pain [  n ]  Resting SOB [n   ] Exertional SOB  [n  ]  Orthopnea [ n ]   Pedal Edema [ n  ]    Palpitations [ n ] Syncope  [ n ]   Presyncope [  n ]  General Review of Systems: [Y] = yes [  ]=no Constitional: recent weight change Blue.Reese  ];  Wt loss over the last 3 months [   ] anorexia [  ]; fatigue [  ]; nausea [  ]; night sweats [  ];  fever [  ]; or chills [  ];          Dental: poor dentition[  ]; Last Dentist visit:   Eye : blurred vision [  ]; diplopia [   ]; vision changes [  ];  Amaurosis fugax[  ]; Resp: cough [  ];  wheezing[  ];  hemoptysis[  ]; shortness of breath[  ]; paroxysmal nocturnal dyspnea[  ]; dyspnea on exertion[n  ]; or orthopnea[  n];  GI:  gallstones[  ], vomiting[  ];  dysphagia[  ]; melena[  ];  hematochezia [  ]; heartburn[  ];   Hx of  Colonoscopy[  ]y; GU: kidney stones [  ]; hematuria[  ];   dysuria [  ];  nocturia[  ];  history of     obstruction [  ]; urinary frequency [  ]             Skin: rash, swelling[  ];, hair loss[  ];  peripheral edema[  ];  or itching[  ]; Musculosketetal: myalgias[  ];  joint swelling[  ];  joint erythema[  ];  joint pain[  ];  back pain[  ];  Heme/Lymph: bruising[  ];  bleeding[  ];  anemia[  ];  Neuro: TIA[ n  headaches[  ];  stroke[  ];  vertigo[  ];  seizures[  ];   paresthesias[  ];  difficulty walking[n  ];  Psych:depression[  ]; anxiety[  ];  Endocrine: diabetes[  ];  thyroid dysfunction[  ];  Immunizations: Flu up to date Blue.Reese  ]; Pneumococcal up to date [  ]y;  Other:  Physical Exam: BP 144/59 mmHg  Pulse 71  Temp(Src) 98.1 F (36.7 C)  Resp 19  Ht '5\' 8"'$  (1.727 m)  Wt 234 lb 1.6 oz (106.187 kg)  BMI 35.60 kg/m2  SpO2 100%  PHYSICAL EXAMINATION: General appearance: alert, cooperative, appears older than stated age and moderately obese Head: Normocephalic, without obvious abnormality, atraumatic Neck: no adenopathy, no carotid bruit, no JVD, supple, symmetrical, trachea midline and thyroid not enlarged, symmetric, no tenderness/mass/nodules Lymph nodes: Cervical, supraclavicular, and axillary nodes normal. Resp: clear to auscultation bilaterally Back: symmetric, no curvature. ROM normal. No CVA tenderness. Cardio: regular rate and rhythm, S1, S2 normal, no murmur, click, rub or gallop GI: soft, non-tender; bowel sounds normal; no masses,  no  organomegaly Extremities: extremities normal, atraumatic, no cyanosis or edema and Homans sign is negative, no sign of DVT Neurologic: Grossly normal Right lower abdominal incision fram ileostomy take doen Diagnostic Studies & Laboratory data:     Recent Radiology Findings:   Nm Pet Image Initial (pi) Skull Base To Thigh  10/22/2015  CLINICAL DATA:  Subsequent treatment strategy for rectal carcinoma. Enlarging lung nodules. EXAM: NUCLEAR MEDICINE PET SKULL BASE TO THIGH TECHNIQUE: 11.1 mCi F-18 FDG was injected intravenously. Full-ring PET imaging was performed from the skull base to thigh after the radiotracer. CT data was obtained and used for attenuation correction and anatomic localization. FASTING BLOOD GLUCOSE:  Value: 97 mg/dl COMPARISON:  CT 07/12/2015, PET-CT 02/17/2013 FINDINGS: NECK No hypermetabolic lymph nodes in the neck. CHEST Nodule in the superior medial aspect of the LEFT upper lobe measures 13  x 12 mm compared to 12 x 10 mm on CT of 07/12/2015. This nodule has associated metabolic activity with SUV max equal 5.7. The nodule abuts the pleural surface. Nodular thickening in the RIGHT upper lobe along the pleural surface measures 18 mm compared to 18 mm on prior remeasured. No clear metabolic activity associated with this nodular thickening. Within the RIGHT upper lobe rounded nodule measures 8 mm on image 67, series 4 not changed from 9 mm. This RIGHT upper lobe nodule has faint metabolic activity (SUV max less than 1). ABDOMEN/PELVIS No abnormal hypermetabolic activity within the liver, pancreas, adrenal glands, or spleen. No hypermetabolic lymph nodes in the abdomen or pelvis. High-density lucent lesion of the LEFT kidney (image 127, series 4 does not have associated metabolic activity There is metabolic activity through the second portion duodenum which is felt to be physiologic. No focal metabolic activity in the RIGHT colon anastomosis. SKELETON No focal hypermetabolic activity to  suggest skeletal metastasis. IMPRESSION: 1. Hypermetabolic nodule in the medial aspect of the LEFT upper lobe is concerning for malignancy with differential including primary carcinoma versus metastatic rectal carcinoma. 2. Stable nodule in the RIGHT upper lobe with very mild metabolic activity favors benign etiology. Stable nodular thickening in the RIGHT upper lobe without metabolic activity is also likely benign. 3. No evidence of local recurrence or metastasis in the abdomen pelvis. Electronically Signed   By: Suzy Bouchard M.D.   On: 10/22/2015 15:32     I have independently reviewed the above radiologic studies.  Recent Lab Findings: Lab Results  Component Value Date   WBC 11.4* 08/22/2015   HGB 11.2* 08/22/2015   HCT 34.6* 08/22/2015   PLT 233.0 08/22/2015   GLUCOSE 103* 08/22/2015   CHOL 208* 06/04/2015   TRIG 203.0* 06/04/2015   HDL 34.80* 06/04/2015   LDLDIRECT 120.0 06/04/2015   LDLCALC 131* 05/29/2010   ALT 7 06/04/2015   AST 11 06/04/2015   NA 137 08/22/2015   K 4.1 08/22/2015   CL 98 08/22/2015   CREATININE 3.25* 08/22/2015   BUN 48* 08/22/2015   CO2 29 08/22/2015   TSH 1.90 08/22/2015   INR 1.16 05/26/2013   HGBA1C 6.0 12/20/2009   Chronic Kidney Disease   Stage I     GFR >90  Stage II    GFR 60-89  Stage IIIA GFR 45-59  Stage IIIB GFR 30-44  Stage IV   GFR 15-29  Stage V    GFR  <15  Lab Results  Component Value Date   CREATININE 3.25* 08/22/2015   CrCl cannot be calculated (Patient has no serum creatinine result on file.).    Assessment / Plan:   Hypersomnia- Airway class IV. I can barely see the back of his mouth. Very crowded airway. Neck circ 17 inches  Rectal cancer, clinical stage III (uT3, uN2).  Initiation of concurrent Xeloda and radiation on 02/21/2013, completed 03/31/2013.   CEA normal 04/25/2013   Status post laparoscopic low anterior resection with diverting loop ileostomy 05/23/2013. Final pathology showed a 3.2 cm invasive  adenocarcinoma with neoadjuvant related change. Tumor invaded into perirectal soft tissue. There was no lymphovascular or perineural invasion. 14 lymph nodes were negative for tumor. Surgical margins were negative. There was one hyperplastic polyp (ypT3, pN0).   Initiation of weekly 5-FU/leucovorin 08/01/2013   Ileostomy takedown 08/24/2013.   Restaging CTs 02/20/2014 negative for evidence of recurrent disease  restaging CT scans 04/05/2015 negative for evidence of recurrent disease  Surveillance colonoscopy 03/31/2014, status  post removal of of a tubular adenoma from the ascending colo   Markedly elevated pretreatment CEA Indeterminate pulmonary/subpleural nodules and right hepatic dome lesion. Unchanged on the CT 04/25/2013, unchanged nodular lung lesions on the CT 02/20/2014 Renal insufficiency.Stage IV Acute on chronic renal failure during hospitalization November/December 2014. Improved following the ileostomy takedown. Hypertension. Gout. Prolonged postoperative ileus following laparoscopic low anterior resection with diverting loop ileostomy 05/23/2013  With the small size but defining enlarging hypermetabolic left upper lobe lesion concern for malignancy is great. I have suggested proceeding with left VATS lung resection . IR has declined to do  needle bx of lesion. Risks and options of surgery discussed with patient and wife . Plan surgical resection May 16 . I  spent 40 minutes counseling the patient face to face and 50% or more the  time was spent in counseling and coordination of care. The total time spent in the appointment was 60 minutes.  [name] MD      [address] [place] [address] Office [phone]   Beeper [phone]  11/08/2015 5:40 PM

## 2015-11-13 DIAGNOSIS — G4733 Obstructive sleep apnea (adult) (pediatric): Secondary | ICD-10-CM | POA: Diagnosis not present

## 2015-11-14 DIAGNOSIS — D631 Anemia in chronic kidney disease: Secondary | ICD-10-CM | POA: Diagnosis not present

## 2015-11-14 DIAGNOSIS — N184 Chronic kidney disease, stage 4 (severe): Secondary | ICD-10-CM | POA: Diagnosis not present

## 2015-11-14 DIAGNOSIS — N2581 Secondary hyperparathyroidism of renal origin: Secondary | ICD-10-CM | POA: Diagnosis not present

## 2015-11-14 DIAGNOSIS — I10 Essential (primary) hypertension: Secondary | ICD-10-CM | POA: Diagnosis not present

## 2015-11-15 ENCOUNTER — Telehealth: Payer: Self-pay | Admitting: Pulmonary Disease

## 2015-11-15 ENCOUNTER — Ambulatory Visit: Payer: Medicare Other | Admitting: Physical Therapy

## 2015-11-15 DIAGNOSIS — H2513 Age-related nuclear cataract, bilateral: Secondary | ICD-10-CM | POA: Diagnosis not present

## 2015-11-15 DIAGNOSIS — H401221 Low-tension glaucoma, left eye, mild stage: Secondary | ICD-10-CM | POA: Diagnosis not present

## 2015-11-15 DIAGNOSIS — H401212 Low-tension glaucoma, right eye, moderate stage: Secondary | ICD-10-CM | POA: Diagnosis not present

## 2015-11-15 DIAGNOSIS — G4733 Obstructive sleep apnea (adult) (pediatric): Secondary | ICD-10-CM

## 2015-11-15 DIAGNOSIS — H40033 Anatomical narrow angle, bilateral: Secondary | ICD-10-CM | POA: Diagnosis not present

## 2015-11-15 NOTE — Telephone Encounter (Signed)
  Please call the pt and tell the pt the [place]  showed OSA  Pt stops breathing   52  times an hour.   Please order autoCPAP 5-15 cm H2O. Patient will need a mask fitting session. Patient will need a 1 month download.   Patient needs to be seen 4-6 weeks after obtaining the cpap machine. Let me know if you receive this.   Thanks!   J. Shirl Harris, MD 11/15/2015, 10:28 PM

## 2015-11-16 ENCOUNTER — Encounter (HOSPITAL_COMMUNITY): Payer: Self-pay

## 2015-11-16 ENCOUNTER — Encounter (HOSPITAL_COMMUNITY)
Admission: RE | Admit: 2015-11-16 | Discharge: 2015-11-16 | Disposition: A | Discharge status: Home / Self Care | Payer: Medicare Other | Source: Ambulatory Visit | Attending: Cardiothoracic Surgery | Admitting: Cardiothoracic Surgery

## 2015-11-16 ENCOUNTER — Other Ambulatory Visit: Payer: Self-pay | Admitting: *Deleted

## 2015-11-16 ENCOUNTER — Inpatient Hospital Stay (HOSPITAL_COMMUNITY): Admission: RE | Admit: 2015-11-16 | Payer: Medicare Other | Source: Ambulatory Visit

## 2015-11-16 ENCOUNTER — Other Ambulatory Visit (HOSPITAL_COMMUNITY): Payer: Medicare Other

## 2015-11-16 ENCOUNTER — Ambulatory Visit (HOSPITAL_COMMUNITY)
Admission: RE | Admit: 2015-11-16 | Discharge: 2015-11-16 | Disposition: A | Discharge status: Home / Self Care | Payer: Medicare Other | Source: Ambulatory Visit | Attending: Cardiothoracic Surgery | Admitting: Cardiothoracic Surgery

## 2015-11-16 ENCOUNTER — Other Ambulatory Visit: Payer: Self-pay

## 2015-11-16 VITALS — BP 165/78 | HR 66 | Temp 98.4°F | Resp 20 | Ht 68.0 in | Wt 235.2 lb

## 2015-11-16 DIAGNOSIS — Z0183 Encounter for blood typing: Secondary | ICD-10-CM | POA: Insufficient documentation

## 2015-11-16 DIAGNOSIS — Z01812 Encounter for preprocedural laboratory examination: Secondary | ICD-10-CM | POA: Diagnosis not present

## 2015-11-16 DIAGNOSIS — R918 Other nonspecific abnormal finding of lung field: Secondary | ICD-10-CM | POA: Insufficient documentation

## 2015-11-16 DIAGNOSIS — Z01818 Encounter for other preprocedural examination: Secondary | ICD-10-CM | POA: Diagnosis not present

## 2015-11-16 DIAGNOSIS — G4710 Hypersomnia, unspecified: Secondary | ICD-10-CM

## 2015-11-16 DIAGNOSIS — G4733 Obstructive sleep apnea (adult) (pediatric): Secondary | ICD-10-CM | POA: Diagnosis not present

## 2015-11-16 DIAGNOSIS — R911 Solitary pulmonary nodule: Secondary | ICD-10-CM

## 2015-11-16 HISTORY — DX: Sleep apnea, unspecified: G47.30

## 2015-11-16 HISTORY — DX: Chronic obstructive pulmonary disease, unspecified: J44.9

## 2015-11-16 LAB — TYPE AND SCREEN
ABO/RH(D): AB POS
Antibody Screen: NEGATIVE

## 2015-11-16 LAB — URINALYSIS, ROUTINE W REFLEX MICROSCOPIC
Bilirubin Urine: NEGATIVE
Glucose, UA: NEGATIVE mg/dL
Ketones, ur: NEGATIVE mg/dL
Leukocytes, UA: NEGATIVE
Nitrite: NEGATIVE
Protein, ur: 100 mg/dL — AB
Specific Gravity, Urine: 1.01 (ref 1.005–1.030)
pH: 6.5 (ref 5.0–8.0)

## 2015-11-16 LAB — SURGICAL PCR SCREEN
MRSA, PCR: NEGATIVE
Staphylococcus aureus: NEGATIVE

## 2015-11-16 LAB — CBC
HCT: 33.5 % — ABNORMAL LOW (ref 39.0–52.0)
Hemoglobin: 10.5 g/dL — ABNORMAL LOW (ref 13.0–17.0)
MCH: 26.5 pg (ref 26.0–34.0)
MCHC: 31.3 g/dL (ref 30.0–36.0)
MCV: 84.6 fL (ref 78.0–100.0)
Platelets: 213 10*3/uL (ref 150–400)
RBC: 3.96 MIL/uL — ABNORMAL LOW (ref 4.22–5.81)
RDW: 15.3 % (ref 11.5–15.5)
WBC: 7.1 10*3/uL (ref 4.0–10.5)

## 2015-11-16 LAB — BLOOD GAS, ARTERIAL
Acid-Base Excess: 4.4 mmol/L — ABNORMAL HIGH (ref 0.0–2.0)
Bicarbonate: 28.4 mEq/L — ABNORMAL HIGH (ref 20.0–24.0)
Drawn by: 421801
FIO2: 0.21
O2 Saturation: 97.2 %
Patient temperature: 98.6
TCO2: 29.8 mmol/L (ref 0–100)
pCO2 arterial: 42.8 mmHg (ref 35.0–45.0)
pH, Arterial: 7.437 (ref 7.350–7.450)
pO2, Arterial: 92.2 mmHg (ref 80.0–100.0)

## 2015-11-16 LAB — COMPREHENSIVE METABOLIC PANEL
ALT: 10 U/L — ABNORMAL LOW (ref 17–63)
AST: 13 U/L — ABNORMAL LOW (ref 15–41)
Albumin: 3.7 g/dL (ref 3.5–5.0)
Alkaline Phosphatase: 64 U/L (ref 38–126)
Anion gap: 14 (ref 5–15)
BUN: 45 mg/dL — ABNORMAL HIGH (ref 6–20)
CO2: 26 mmol/L (ref 22–32)
Calcium: 10.8 mg/dL — ABNORMAL HIGH (ref 8.9–10.3)
Chloride: 102 mmol/L (ref 101–111)
Creatinine, Ser: 3.48 mg/dL — ABNORMAL HIGH (ref 0.61–1.24)
GFR calc Af Amer: 19 mL/min — ABNORMAL LOW (ref >60)
GFR calc non Af Amer: 17 mL/min — ABNORMAL LOW (ref >60)
Glucose, Bld: 100 mg/dL — ABNORMAL HIGH (ref 65–99)
Potassium: 4.2 mmol/L (ref 3.5–5.1)
Sodium: 142 mmol/L (ref 135–145)
Total Bilirubin: 0.4 mg/dL (ref 0.3–1.2)
Total Protein: 6.8 g/dL (ref 6.5–8.1)

## 2015-11-16 LAB — PROTIME-INR
INR: 1.11 (ref 0.00–1.49)
Prothrombin Time: 14.5 seconds (ref 11.6–15.2)

## 2015-11-16 LAB — URINE MICROSCOPIC-ADD ON: Bacteria, UA: NONE SEEN

## 2015-11-16 LAB — ABO/RH: ABO/RH(D): AB POS

## 2015-11-16 LAB — APTT: aPTT: 32 seconds (ref 24–37)

## 2015-11-16 NOTE — Telephone Encounter (Signed)
ATC pt, no answer and no VM. Unable to leave message. Will call again later.

## 2015-11-16 NOTE — Pre-Procedure Instructions (Addendum)
Jason [name].  11/16/2015      [facility] - [place] - [address] [place] [address] Phone: [phone] Fax: [phone]  [facility] - [place]), [place] [address]  ([place])  [address] Phone: [phone] Fax: [phone]  [facility] [place], [place] [address] [place] [address] Phone: [phone] Fax: [phone]    Your procedure is scheduled on 11/20/15  Report to [place] at 530 A.M.  Call this number if you have problems the morning of surgery:  [phone]   Remember:  Do not eat food or drink liquids after midnight.  Take these medicines the morning of surgery with A SIP OF WATER allopurinol,carvedilol,(coreg),doxazosin(cardura),gabapentin, tylenol if needed  STOP all herbel meds, nsaids (aleve,naproxen,advil,ibuprofen) 5 days prior to surgery starting now including vitamins,aspirin   Do not wear jewelry, make-up or nail polish.  Do not wear lotions, powders, or perfumes.  You may wear deodorant.  Do not shave 48 hours prior to surgery.  Men may shave face and neck.  Do not bring valuables to the hospital.  [facility] is not responsible for any belongings or valuables.  Contacts, dentures or bridgework may not be worn into surgery.  Leave your suitcase in the car.  After surgery it may be brought to your room.  For patients admitted to the hospital, discharge time will be determined by your treatment team.  Patients discharged the day of surgery will not be allowed to drive home.   Name and phone number of your driver:   Special instructions:   Special Instructions: [facility] - Preparing for Surgery  Before surgery, you can play an important role.  Because skin is not sterile, your skin needs to be as free of germs as possible.  You can reduce the number of germs on you  skin by washing with CHG (chlorahexidine gluconate) soap before surgery.  CHG is an antiseptic cleaner which kills germs and bonds with the skin to continue killing germs even after washing.  Please DO NOT use if you have an allergy to CHG or antibacterial soaps.  If your skin becomes reddened/irritated stop using the CHG and inform your nurse when you arrive at Short Stay.  Do not shave (including legs and underarms) for at least 48 hours prior to the first CHG shower.  You may shave your face.  Please follow these instructions carefully:   1.  Shower with CHG Soap the night before surgery and the morning of Surgery.  2.  If you choose to wash your hair, wash your hair first as usual with your normal shampoo.  3.  After you shampoo, rinse your hair and body thoroughly to remove the Shampoo.  4.  Use CHG as you would any other liquid soap.  You can apply chg directly  to the skin and wash gently with scrungie or a clean washcloth.  5.  Apply the CHG Soap to your body ONLY FROM THE NECK DOWN.  Do not use on open wounds or open sores.  Avoid contact with your eyes ears, mouth and genitals (private parts).  Wash genitals (private parts)       with your normal soap.  6.  Wash thoroughly, paying special attention to the area where your surgery will be performed.  7.  Thoroughly rinse your body with warm water from the  neck down.  8.  DO NOT shower/wash with your normal soap after using and rinsing off the CHG Soap.  9.  Pat yourself dry with a clean towel.            10.  Wear clean pajamas.            11.  Place clean sheets on your bed the night of your first shower and do not sleep with pets.  Day of Surgery  Do not apply any lotions/deodorants the morning of surgery.  Please wear clean clothes to the hospital/surgery center.  Please read over the following fact sheets that you were given. Pain Booklet, Coughing and Deep Breathing, Blood Transfusion Information, MRSA Information and Surgical  Site Infection Prevention

## 2015-11-16 NOTE — Progress Notes (Signed)
req'd office notes from recent visit 11/13/15

## 2015-11-19 ENCOUNTER — Ambulatory Visit (INDEPENDENT_AMBULATORY_CARE_PROVIDER_SITE_OTHER): Payer: Medicare Other | Admitting: Internal Medicine

## 2015-11-19 ENCOUNTER — Encounter: Payer: Self-pay | Admitting: Internal Medicine

## 2015-11-19 VITALS — BP 140/80 | HR 70 | Temp 98.6°F | Resp 16 | Ht 68.0 in | Wt 236.0 lb

## 2015-11-19 DIAGNOSIS — I10 Essential (primary) hypertension: Secondary | ICD-10-CM

## 2015-11-19 NOTE — Progress Notes (Signed)
Subjective:  Patient ID: Jason Cotta Sr., male    DOB: 11-24-1945  Age: 70 y.o. MRN: [id number]  CC: Hypertension   HPI Jason ROMBERGER Sr. presents for a blood pressure check. When I last saw him he complained of ankle edema that was felt to be related to amlodipine. He has stopped amlodipine and the ankle edema has resolved. He also tells me his blood pressure has been well controlled, he has had no episodes of headache, blurred vision, shortness of breath, palpitations, edema, or fatigue.  Outpatient Prescriptions Prior to Visit  Medication Sig Dispense Refill  . acetaminophen (TYLENOL) 500 MG tablet Take 1,000 mg by mouth every 6 (six) hours as needed for moderate pain or headache.    . allopurinol (ZYLOPRIM) 300 MG tablet TAKE ONE TABLET BY MOUTH ONCE DAILY IN THE MORNING 90 tablet 2  . carvedilol (COREG) 12.5 MG tablet Take 12.5 mg by mouth 2 (two) times daily with a meal.     . doxazosin (CARDURA) 8 MG tablet Take 1 tablet (8 mg total) by mouth at bedtime. 90 tablet 3  . furosemide (LASIX) 80 MG tablet TAKE ONE TABLET BY MOUTH ONCE DAILY 90 tablet 0  . gabapentin (NEURONTIN) 300 MG capsule Take 300-600 mg by mouth as directed. Take '300mg'$ s morning and '600mg'$ s at bedtime    . KLOR-CON M20 20 MEQ tablet Take 1 tablet (20 mEq total) by mouth once. (Patient taking differently: Take 20 mEq by mouth 2 (two) times daily. ) 90 tablet 1  . loperamide (IMODIUM) 2 MG capsule Take 1 capsule (2 mg total) by mouth as needed for diarrhea or loose stools. 30 capsule 3  . polycarbophil (FIBERCON) 625 MG tablet Take 1,250 mg by mouth daily.    . Vitamin D, Ergocalciferol, (DRISDOL) 50000 UNITS CAPS capsule Take 50,000 Units by mouth every 7 (seven) days.     No facility-administered medications prior to visit.    ROS Review of Systems  Constitutional: Negative.  Negative for activity change, appetite change, fatigue and unexpected weight change.  HENT: Negative.  Negative for trouble swallowing.     Eyes: Negative.   Respiratory: Negative.  Negative for cough, choking, chest tightness, shortness of breath and stridor.   Cardiovascular: Negative.  Negative for chest pain, palpitations and leg swelling.  Gastrointestinal: Negative.  Negative for nausea, vomiting, abdominal pain, diarrhea and constipation.  Endocrine: Negative.   Genitourinary: Negative.  Negative for difficulty urinating.  Musculoskeletal: Negative.  Negative for myalgias, back pain, arthralgias and neck pain.  Skin: Negative.  Negative for color change.  Allergic/Immunologic: Negative.   Neurological: Negative.  Negative for dizziness, tremors, speech difficulty, weakness, light-headedness, numbness and headaches.  Hematological: Negative.  Negative for adenopathy.  Psychiatric/Behavioral: Negative.     Objective:  BP 140/80 mmHg  Pulse 70  Temp(Src) 98.6 F (37 C) (Oral)  Resp 16  Ht '5\' 8"'$  (1.727 m)  Wt 236 lb (107.049 kg)  BMI 35.89 kg/m2  SpO2 99%  BP Readings from Last 3 Encounters:  11/19/15 140/80  11/16/15 165/78  11/08/15 144/59    Wt Readings from Last 3 Encounters:  11/19/15 236 lb (107.049 kg)  11/16/15 235 lb 3.2 oz (106.686 kg)  11/08/15 234 lb 1.6 oz (106.187 kg)    Physical Exam  Constitutional: He is oriented to person, place, and time. No distress.  HENT:  Mouth/Throat: Oropharynx is clear and moist. No oropharyngeal exudate.  Eyes: Conjunctivae are normal. Right eye exhibits no discharge. Left eye  exhibits no discharge. No scleral icterus.  Neck: Normal range of motion. Neck supple. No JVD present. No tracheal deviation present. No thyromegaly present.  Cardiovascular: Normal rate, regular rhythm, normal heart sounds and intact distal pulses.  Exam reveals no gallop and no friction rub.   No murmur heard. Pulmonary/Chest: Effort normal and breath sounds normal. No stridor. No respiratory distress. He has no wheezes. He has no rales. He exhibits no tenderness.  Abdominal: Soft.  Bowel sounds are normal. He exhibits no distension and no mass. There is no tenderness. There is no rebound and no guarding.  Musculoskeletal: Normal range of motion. He exhibits no edema or tenderness.  Lymphadenopathy:    He has no cervical adenopathy.  Neurological: He is oriented to person, place, and time.  Skin: Skin is warm and dry. No rash noted. He is not diaphoretic. No erythema. No pallor.  Vitals reviewed.   Lab Results  Component Value Date   WBC 7.1 11/16/2015   HGB 10.5* 11/16/2015   HCT 33.5* 11/16/2015   PLT 213 11/16/2015   GLUCOSE 100* 11/16/2015   CHOL 208* 06/04/2015   TRIG 203.0* 06/04/2015   HDL 34.80* 06/04/2015   LDLDIRECT 120.0 06/04/2015   LDLCALC 131* 05/29/2010   ALT 10* 11/16/2015   AST 13* 11/16/2015   NA 142 11/16/2015   K 4.2 11/16/2015   CL 102 11/16/2015   CREATININE 3.48* 11/16/2015   BUN 45* 11/16/2015   CO2 26 11/16/2015   TSH 1.90 08/22/2015   PSA 1.86 08/10/2013   INR 1.11 11/16/2015   HGBA1C 6.0 12/20/2009    Dg Chest 2 View  11/16/2015  CLINICAL DATA:  Preoperative examination prior to video-assisted thoracoscopy and lung resection. EXAM: CHEST  2 VIEW COMPARISON:  CT scan of the chest of July 12, 2015 and PET-CT study of October 22, 2015. FINDINGS: The lungs are well-expanded. There is no focal infiltrate. Known pulmonary nodules are not well demonstrated. Subtle nodularity projects over the posterior aspect of the right seventh rib. There is no pleural effusion. The heart is normal in size. The pulmonary vascularity is not engorged. There is calcification of the anterior longitudinal ligament of the thoracic spine. IMPRESSION: No plain radiographic evidence of active cardiopulmonary disease. Subtle nodularity in the right mid lung. The patient has known pulmonary parenchymal nodules. Electronically Signed   By: David  Martinique M.D.   On: 11/16/2015 16:35    Assessment & Plan:   Jason [name] was seen today for hypertension.  Diagnoses and  all orders for this visit:  Essential hypertension- his blood pressure is adequately well-controlled, he will continue the current medications.  I am having Jason [name] maintain his loperamide, polycarbophil, Vitamin D (Ergocalciferol), gabapentin, furosemide, carvedilol, doxazosin, KLOR-CON M20, allopurinol, and acetaminophen.  No orders of the defined types were placed in this encounter.     Follow-up: No Follow-up on file.  [name], MD

## 2015-11-19 NOTE — Patient Instructions (Signed)
Hypertension Hypertension, commonly called high blood pressure, is when the force of blood pumping through your arteries is too strong. Your arteries are the blood vessels that carry blood from your heart throughout your body. A blood pressure reading consists of a higher number over a lower number, such as 110/72. The higher number (systolic) is the pressure inside your arteries when your heart pumps. The lower number (diastolic) is the pressure inside your arteries when your heart relaxes. Ideally you want your blood pressure below 120/80. Hypertension forces your heart to work harder to pump blood. Your arteries may become narrow or stiff. Having untreated or uncontrolled hypertension can cause heart attack, stroke, kidney disease, and other problems. RISK FACTORS Some risk factors for high blood pressure are controllable. Others are not.  Risk factors you cannot control include:   Race. You may be at higher risk if you are African American.  Age. Risk increases with age.  Gender. Men are at higher risk than women before age 45 years. After age 65, women are at higher risk than men. Risk factors you can control include:  Not getting enough exercise or physical activity.  Being overweight.  Getting too much fat, sugar, calories, or salt in your diet.  Drinking too much alcohol. SIGNS AND SYMPTOMS Hypertension does not usually cause signs or symptoms. Extremely high blood pressure (hypertensive crisis) may cause headache, anxiety, shortness of breath, and nosebleed. DIAGNOSIS To check if you have hypertension, your health care provider will measure your blood pressure while you are seated, with your arm held at the level of your heart. It should be measured at least twice using the same arm. Certain conditions can cause a difference in blood pressure between your right and left arms. A blood pressure reading that is higher than normal on one occasion does not mean that you need treatment. If  it is not clear whether you have high blood pressure, you may be asked to return on a different day to have your blood pressure checked again. Or, you may be asked to monitor your blood pressure at home for 1 or more weeks. TREATMENT Treating high blood pressure includes making lifestyle changes and possibly taking medicine. Living a healthy lifestyle can help lower high blood pressure. You may need to change some of your habits. Lifestyle changes may include:  Following the DASH diet. This diet is high in fruits, vegetables, and whole grains. It is low in salt, red meat, and added sugars.  Keep your sodium intake below 2,300 mg per day.  Getting at least 30-45 minutes of aerobic exercise at least 4 times per week.  Losing weight if necessary.  Not smoking.  Limiting alcoholic beverages.  Learning ways to reduce stress. Your health care provider may prescribe medicine if lifestyle changes are not enough to get your blood pressure under control, and if one of the following is true:  You are 18-59 years of age and your systolic blood pressure is above 140.  You are 60 years of age or older, and your systolic blood pressure is above 150.  Your diastolic blood pressure is above 90.  You have diabetes, and your systolic blood pressure is over 140 or your diastolic blood pressure is over 90.  You have kidney disease and your blood pressure is above 140/90.  You have heart disease and your blood pressure is above 140/90. Your personal target blood pressure may vary depending on your medical conditions, your age, and other factors. HOME CARE INSTRUCTIONS    Have your blood pressure rechecked as directed by your health care provider.   Take medicines only as directed by your health care provider. Follow the directions carefully. Blood pressure medicines must be taken as prescribed. The medicine does not work as well when you skip doses. Skipping doses also puts you at risk for  problems.  Do not smoke.   Monitor your blood pressure at home as directed by your health care provider. SEEK MEDICAL CARE IF:   You think you are having a reaction to medicines taken.  You have recurrent headaches or feel dizzy.  You have swelling in your ankles.  You have trouble with your vision. SEEK IMMEDIATE MEDICAL CARE IF:  You develop a severe headache or confusion.  You have unusual weakness, numbness, or feel faint.  You have severe chest or abdominal pain.  You vomit repeatedly.  You have trouble breathing. MAKE SURE YOU:   Understand these instructions.  Will watch your condition.  Will get help right away if you are not doing well or get worse.   This information is not intended to replace advice given to you by your health care provider. Make sure you discuss any questions you have with your health care provider.   Document Released: 06/23/2005 Document Revised: 11/07/2014 Document Reviewed: 04/15/2013 Elsevier Interactive Patient Education 2016 [employer]  

## 2015-11-19 NOTE — Progress Notes (Signed)
Pre visit review using our clinic review tool, if applicable. No additional management support is needed unless otherwise documented below in the visit note. 

## 2015-11-20 ENCOUNTER — Encounter (HOSPITAL_COMMUNITY): Payer: Self-pay | Admitting: Anesthesiology

## 2015-11-20 ENCOUNTER — Inpatient Hospital Stay (HOSPITAL_COMMUNITY): Payer: Medicare Other

## 2015-11-20 ENCOUNTER — Inpatient Hospital Stay (HOSPITAL_COMMUNITY): Payer: Medicare Other | Admitting: Certified Registered Nurse Anesthetist

## 2015-11-20 ENCOUNTER — Inpatient Hospital Stay (HOSPITAL_COMMUNITY)
Admission: AD | Admit: 2015-11-20 | Discharge: 2015-11-23 | DRG: 164 | Disposition: A | Discharge status: Home / Self Care | Payer: Medicare Other | Source: Ambulatory Visit | Attending: Cardiothoracic Surgery | Admitting: Cardiothoracic Surgery

## 2015-11-20 ENCOUNTER — Encounter (HOSPITAL_COMMUNITY)
Admission: AD | Disposition: A | Discharge status: Home / Self Care | Payer: Self-pay | Source: Ambulatory Visit | Attending: Cardiothoracic Surgery

## 2015-11-20 DIAGNOSIS — N184 Chronic kidney disease, stage 4 (severe): Secondary | ICD-10-CM | POA: Diagnosis present

## 2015-11-20 DIAGNOSIS — D62 Acute posthemorrhagic anemia: Secondary | ICD-10-CM | POA: Diagnosis not present

## 2015-11-20 DIAGNOSIS — Z938 Other artificial opening status: Secondary | ICD-10-CM

## 2015-11-20 DIAGNOSIS — Z9221 Personal history of antineoplastic chemotherapy: Secondary | ICD-10-CM | POA: Diagnosis not present

## 2015-11-20 DIAGNOSIS — Z923 Personal history of irradiation: Secondary | ICD-10-CM | POA: Diagnosis not present

## 2015-11-20 DIAGNOSIS — R911 Solitary pulmonary nodule: Secondary | ICD-10-CM

## 2015-11-20 DIAGNOSIS — Z902 Acquired absence of lung [part of]: Secondary | ICD-10-CM

## 2015-11-20 DIAGNOSIS — C7802 Secondary malignant neoplasm of left lung: Secondary | ICD-10-CM | POA: Diagnosis not present

## 2015-11-20 DIAGNOSIS — M109 Gout, unspecified: Secondary | ICD-10-CM | POA: Diagnosis present

## 2015-11-20 DIAGNOSIS — I129 Hypertensive chronic kidney disease with stage 1 through stage 4 chronic kidney disease, or unspecified chronic kidney disease: Secondary | ICD-10-CM | POA: Diagnosis present

## 2015-11-20 DIAGNOSIS — Z85048 Personal history of other malignant neoplasm of rectum, rectosigmoid junction, and anus: Secondary | ICD-10-CM | POA: Diagnosis not present

## 2015-11-20 DIAGNOSIS — Z9689 Presence of other specified functional implants: Secondary | ICD-10-CM

## 2015-11-20 DIAGNOSIS — Z79899 Other long term (current) drug therapy: Secondary | ICD-10-CM

## 2015-11-20 DIAGNOSIS — D649 Anemia, unspecified: Secondary | ICD-10-CM | POA: Diagnosis not present

## 2015-11-20 DIAGNOSIS — Z85038 Personal history of other malignant neoplasm of large intestine: Secondary | ICD-10-CM

## 2015-11-20 DIAGNOSIS — Z87891 Personal history of nicotine dependence: Secondary | ICD-10-CM | POA: Diagnosis not present

## 2015-11-20 DIAGNOSIS — J9811 Atelectasis: Secondary | ICD-10-CM | POA: Diagnosis not present

## 2015-11-20 DIAGNOSIS — R918 Other nonspecific abnormal finding of lung field: Secondary | ICD-10-CM | POA: Diagnosis present

## 2015-11-20 DIAGNOSIS — E875 Hyperkalemia: Secondary | ICD-10-CM | POA: Diagnosis not present

## 2015-11-20 DIAGNOSIS — G4710 Hypersomnia, unspecified: Secondary | ICD-10-CM | POA: Diagnosis not present

## 2015-11-20 DIAGNOSIS — Z09 Encounter for follow-up examination after completed treatment for conditions other than malignant neoplasm: Secondary | ICD-10-CM

## 2015-11-20 DIAGNOSIS — Z6836 Body mass index (BMI) 36.0-36.9, adult: Secondary | ICD-10-CM | POA: Diagnosis not present

## 2015-11-20 DIAGNOSIS — J449 Chronic obstructive pulmonary disease, unspecified: Secondary | ICD-10-CM | POA: Diagnosis not present

## 2015-11-20 DIAGNOSIS — J939 Pneumothorax, unspecified: Secondary | ICD-10-CM | POA: Diagnosis not present

## 2015-11-20 HISTORY — DX: Acquired absence of lung (part of): Z90.2

## 2015-11-20 HISTORY — PX: VIDEO BRONCHOSCOPY: SHX5072

## 2015-11-20 HISTORY — PX: VIDEO ASSISTED THORACOSCOPY (VATS)/WEDGE RESECTION: SHX6174

## 2015-11-20 SURGERY — BRONCHOSCOPY, VIDEO-ASSISTED
Anesthesia: General | Site: Chest

## 2015-11-20 MED ORDER — LACTATED RINGERS IV SOLN
INTRAVENOUS | Status: DC | PRN
  Administered 2015-11-20 (×2): via INTRAVENOUS

## 2015-11-20 MED ORDER — KCL IN DEXTROSE-NACL 20-5-0.45 MEQ/L-%-% IV SOLN
INTRAVENOUS | Status: AC
  Filled 2015-11-20: qty 1000

## 2015-11-20 MED ORDER — MEPERIDINE HCL 25 MG/ML IJ SOLN: 6.2500 mg | INTRAMUSCULAR | Status: DC | PRN

## 2015-11-20 MED ORDER — EPHEDRINE SULFATE 50 MG/ML IJ SOLN
INTRAMUSCULAR | Status: DC | PRN
  Administered 2015-11-20: 15 mg via INTRAVENOUS
  Administered 2015-11-20: 10 mg via INTRAVENOUS
  Administered 2015-11-20: 5 mg via INTRAVENOUS

## 2015-11-20 MED ORDER — SUGAMMADEX SODIUM 200 MG/2ML IV SOLN
INTRAVENOUS | Status: DC | PRN
  Administered 2015-11-20: 200 mg via INTRAVENOUS

## 2015-11-20 MED ORDER — LIDOCAINE 2% (20 MG/ML) 5 ML SYRINGE
INTRAMUSCULAR | Status: AC
  Filled 2015-11-20: qty 10

## 2015-11-20 MED ORDER — DIPHENHYDRAMINE HCL 50 MG/ML IJ SOLN: 12.5000 mg | Freq: Four times a day (QID) | INTRAMUSCULAR | Status: DC | PRN

## 2015-11-20 MED ORDER — ONDANSETRON HCL 4 MG/2ML IJ SOLN
4.0000 mg | Freq: Four times a day (QID) | INTRAMUSCULAR | Status: DC | PRN
  Filled 2015-11-20: qty 2

## 2015-11-20 MED ORDER — LABETALOL HCL 5 MG/ML IV SOLN
INTRAVENOUS | Status: AC
  Filled 2015-11-20: qty 4

## 2015-11-20 MED ORDER — SENNOSIDES-DOCUSATE SODIUM 8.6-50 MG PO TABS
1.0000 | ORAL_TABLET | Freq: Every day | ORAL | Status: DC
  Administered 2015-11-20 – 2015-11-22 (×3): 1 via ORAL
  Filled 2015-11-20 (×3): qty 1

## 2015-11-20 MED ORDER — BUPIVACAINE ON-Q PAIN PUMP (FOR ORDER SET NO CHG)
INJECTION | Status: DC
  Filled 2015-11-20: qty 1

## 2015-11-20 MED ORDER — BISACODYL 5 MG PO TBEC
10.0000 mg | DELAYED_RELEASE_TABLET | Freq: Every day | ORAL | Status: DC
  Administered 2015-11-22 – 2015-11-23 (×2): 10 mg via ORAL
  Filled 2015-11-20 (×3): qty 2

## 2015-11-20 MED ORDER — FENTANYL CITRATE (PF) 250 MCG/5ML IJ SOLN
INTRAMUSCULAR | Status: DC | PRN
  Administered 2015-11-20: 100 ug via INTRAVENOUS
  Administered 2015-11-20 (×2): 50 ug via INTRAVENOUS
  Administered 2015-11-20: 150 ug via INTRAVENOUS

## 2015-11-20 MED ORDER — ONDANSETRON HCL 4 MG/2ML IJ SOLN
4.0000 mg | Freq: Four times a day (QID) | INTRAMUSCULAR | Status: DC | PRN
  Administered 2015-11-20: 4 mg via INTRAVENOUS

## 2015-11-20 MED ORDER — DEXTROSE 5 % IV SOLN
1.5000 g | Freq: Two times a day (BID) | INTRAVENOUS | Status: AC
  Administered 2015-11-20 – 2015-11-21 (×2): 1.5 g via INTRAVENOUS
  Filled 2015-11-20 (×2): qty 1.5

## 2015-11-20 MED ORDER — NALOXONE HCL 0.4 MG/ML IJ SOLN: 0.4000 mg | INTRAMUSCULAR | Status: DC | PRN

## 2015-11-20 MED ORDER — CARVEDILOL 12.5 MG PO TABS
12.5000 mg | ORAL_TABLET | Freq: Two times a day (BID) | ORAL | Status: DC
  Administered 2015-11-20 – 2015-11-23 (×6): 12.5 mg via ORAL
  Filled 2015-11-20 (×6): qty 1

## 2015-11-20 MED ORDER — OXYCODONE HCL 5 MG PO TABS
5.0000 mg | ORAL_TABLET | ORAL | Status: DC | PRN
  Administered 2015-11-20 – 2015-11-22 (×2): 10 mg via ORAL
  Administered 2015-11-22: 5 mg via ORAL
  Filled 2015-11-20: qty 2
  Filled 2015-11-20: qty 1

## 2015-11-20 MED ORDER — PHENYLEPHRINE HCL 10 MG/ML IJ SOLN
INTRAMUSCULAR | Status: DC | PRN
  Administered 2015-11-20 (×2): 160 ug via INTRAVENOUS
  Administered 2015-11-20: 80 ug via INTRAVENOUS

## 2015-11-20 MED ORDER — FENTANYL 40 MCG/ML IV SOLN
INTRAVENOUS | Status: DC
  Administered 2015-11-20: 135 ug via INTRAVENOUS
  Administered 2015-11-20: 12:00:00 via INTRAVENOUS
  Administered 2015-11-21: 45 ug via INTRAVENOUS
  Administered 2015-11-21: 60 ug via INTRAVENOUS
  Administered 2015-11-21 (×2): 45 ug via INTRAVENOUS
  Administered 2015-11-21: 75 ug via INTRAVENOUS
  Administered 2015-11-21: 60 ug via INTRAVENOUS
  Administered 2015-11-22: 45 ug via INTRAVENOUS
  Administered 2015-11-22: 30 ug via INTRAVENOUS
  Administered 2015-11-22: 90 ug via INTRAVENOUS

## 2015-11-20 MED ORDER — LIDOCAINE HCL 4 % MT SOLN
OROMUCOSAL | Status: DC | PRN
  Administered 2015-11-20: 4 mL via TOPICAL

## 2015-11-20 MED ORDER — ROCURONIUM BROMIDE 100 MG/10ML IV SOLN
INTRAVENOUS | Status: DC | PRN
  Administered 2015-11-20: 20 mg via INTRAVENOUS
  Administered 2015-11-20: 10 mg via INTRAVENOUS
  Administered 2015-11-20: 50 mg via INTRAVENOUS
  Administered 2015-11-20: 10 mg via INTRAVENOUS

## 2015-11-20 MED ORDER — BUPIVACAINE 0.5 % ON-Q PUMP SINGLE CATH 400 ML
INJECTION | Status: DC | PRN
  Administered 2015-11-20: 400 mL

## 2015-11-20 MED ORDER — PROPOFOL 10 MG/ML IV BOLUS
INTRAVENOUS | Status: DC | PRN
  Administered 2015-11-20: 200 mg via INTRAVENOUS

## 2015-11-20 MED ORDER — GABAPENTIN 300 MG PO CAPS
300.0000 mg | ORAL_CAPSULE | Freq: Every day | ORAL | Status: DC
  Administered 2015-11-21 – 2015-11-23 (×3): 300 mg via ORAL
  Filled 2015-11-20 (×3): qty 1

## 2015-11-20 MED ORDER — BUPIVACAINE 0.5 % ON-Q PUMP SINGLE CATH 400 ML
400.0000 mL | INJECTION | Status: AC
  Filled 2015-11-20: qty 400

## 2015-11-20 MED ORDER — HYDROMORPHONE HCL 1 MG/ML IJ SOLN: 0.5000 mg | INTRAMUSCULAR | Status: DC | PRN

## 2015-11-20 MED ORDER — ONDANSETRON HCL 4 MG/2ML IJ SOLN
INTRAMUSCULAR | Status: DC | PRN
  Administered 2015-11-20: 4 mg via INTRAVENOUS

## 2015-11-20 MED ORDER — LIDOCAINE 2% (20 MG/ML) 5 ML SYRINGE
INTRAMUSCULAR | Status: DC | PRN
  Administered 2015-11-20: 100 mg via INTRAVENOUS

## 2015-11-20 MED ORDER — PHENYLEPHRINE HCL 10 MG/ML IJ SOLN
10.0000 mg | INTRAVENOUS | Status: DC | PRN
  Administered 2015-11-20: 40 ug/min via INTRAVENOUS

## 2015-11-20 MED ORDER — LACTATED RINGERS IV SOLN
INTRAVENOUS | Status: DC | PRN
  Administered 2015-11-20 (×2): via INTRAVENOUS

## 2015-11-20 MED ORDER — ALLOPURINOL 300 MG PO TABS
300.0000 mg | ORAL_TABLET | Freq: Every day | ORAL | Status: DC
  Administered 2015-11-21 – 2015-11-23 (×3): 300 mg via ORAL
  Filled 2015-11-20 (×3): qty 1

## 2015-11-20 MED ORDER — ACETAMINOPHEN 160 MG/5ML PO SOLN: 1000.0000 mg | Freq: Four times a day (QID) | ORAL | Status: DC

## 2015-11-20 MED ORDER — SODIUM CHLORIDE 0.9% FLUSH: 9.0000 mL | INTRAVENOUS | Status: DC | PRN

## 2015-11-20 MED ORDER — DOXAZOSIN MESYLATE 8 MG PO TABS
8.0000 mg | ORAL_TABLET | Freq: Every day | ORAL | Status: DC
Start: 2015-11-20 — End: 2015-11-23
  Administered 2015-11-20 – 2015-11-22 (×3): 8 mg via ORAL
  Filled 2015-11-20 (×3): qty 1

## 2015-11-20 MED ORDER — LABETALOL HCL 5 MG/ML IV SOLN
5.0000 mg | INTRAVENOUS | Status: AC
  Administered 2015-11-20 (×2): 5 mg via INTRAVENOUS

## 2015-11-20 MED ORDER — ACETAMINOPHEN 500 MG PO TABS: 1000.0000 mg | ORAL_TABLET | Freq: Four times a day (QID) | ORAL | Status: DC | PRN

## 2015-11-20 MED ORDER — BUPIVACAINE HCL (PF) 0.5 % IJ SOLN
INTRAMUSCULAR | Status: AC
Start: 2015-11-20 — End: 2015-11-20
  Filled 2015-11-20: qty 10

## 2015-11-20 MED ORDER — HYDROMORPHONE HCL 1 MG/ML IJ SOLN
INTRAMUSCULAR | Status: AC
  Administered 2015-11-20: 0.25 mg via INTRAVENOUS
  Filled 2015-11-20: qty 1

## 2015-11-20 MED ORDER — DIPHENHYDRAMINE HCL 12.5 MG/5ML PO ELIX: 12.5000 mg | ORAL_SOLUTION | Freq: Four times a day (QID) | ORAL | Status: DC | PRN

## 2015-11-20 MED ORDER — ONDANSETRON HCL 4 MG/2ML IJ SOLN
INTRAMUSCULAR | Status: AC
  Filled 2015-11-20: qty 2

## 2015-11-20 MED ORDER — FENTANYL CITRATE (PF) 250 MCG/5ML IJ SOLN
INTRAMUSCULAR | Status: AC
  Filled 2015-11-20: qty 5

## 2015-11-20 MED ORDER — FENTANYL 40 MCG/ML IV SOLN
INTRAVENOUS | Status: AC
  Filled 2015-11-20: qty 25

## 2015-11-20 MED ORDER — OXYCODONE HCL 5 MG PO TABS
ORAL_TABLET | ORAL | Status: AC
  Filled 2015-11-20: qty 2

## 2015-11-20 MED ORDER — OXYCODONE HCL 5 MG/5ML PO SOLN: 5.0000 mg | Freq: Once | ORAL | Status: DC | PRN

## 2015-11-20 MED ORDER — OXYCODONE HCL 5 MG PO TABS: 5.0000 mg | ORAL_TABLET | Freq: Once | ORAL | Status: DC | PRN

## 2015-11-20 MED ORDER — DEXAMETHASONE SODIUM PHOSPHATE 10 MG/ML IJ SOLN
INTRAMUSCULAR | Status: DC | PRN
  Administered 2015-11-20: 10 mg via INTRAVENOUS

## 2015-11-20 MED ORDER — KCL IN DEXTROSE-NACL 20-5-0.45 MEQ/L-%-% IV SOLN
INTRAVENOUS | Status: DC
  Administered 2015-11-20 (×2): via INTRAVENOUS
  Filled 2015-11-20 (×5): qty 1000

## 2015-11-20 MED ORDER — POTASSIUM CHLORIDE 10 MEQ/50ML IV SOLN: 10.0000 meq | Freq: Every day | INTRAVENOUS | Status: DC | PRN

## 2015-11-20 MED ORDER — DEXAMETHASONE SODIUM PHOSPHATE 10 MG/ML IJ SOLN
INTRAMUSCULAR | Status: AC
  Filled 2015-11-20: qty 1

## 2015-11-20 MED ORDER — HYDRALAZINE HCL 20 MG/ML IJ SOLN
INTRAMUSCULAR | Status: AC
  Administered 2015-11-20: 5 mg via INTRAVENOUS
  Filled 2015-11-20: qty 1

## 2015-11-20 MED ORDER — MIDAZOLAM HCL 2 MG/2ML IJ SOLN
INTRAMUSCULAR | Status: AC
  Filled 2015-11-20: qty 2

## 2015-11-20 MED ORDER — GABAPENTIN 600 MG PO TABS
600.0000 mg | ORAL_TABLET | Freq: Every day | ORAL | Status: DC
  Administered 2015-11-20 – 2015-11-22 (×3): 600 mg via ORAL
  Filled 2015-11-20 (×3): qty 1

## 2015-11-20 MED ORDER — PHENYLEPHRINE 40 MCG/ML (10ML) SYRINGE FOR IV PUSH (FOR BLOOD PRESSURE SUPPORT)
PREFILLED_SYRINGE | INTRAVENOUS | Status: AC
  Filled 2015-11-20: qty 10

## 2015-11-20 MED ORDER — LABETALOL HCL 5 MG/ML IV SOLN
10.0000 mg | INTRAVENOUS | Status: DC | PRN
  Administered 2015-11-21: 10 mg via INTRAVENOUS
  Filled 2015-11-20: qty 4

## 2015-11-20 MED ORDER — HYDRALAZINE HCL 20 MG/ML IJ SOLN
5.0000 mg | Freq: Once | INTRAMUSCULAR | Status: AC
  Administered 2015-11-20: 5 mg via INTRAVENOUS

## 2015-11-20 MED ORDER — ONDANSETRON HCL 4 MG/2ML IJ SOLN: 4.0000 mg | Freq: Once | INTRAMUSCULAR | Status: DC | PRN

## 2015-11-20 MED ORDER — SUCCINYLCHOLINE CHLORIDE 200 MG/10ML IV SOSY
PREFILLED_SYRINGE | INTRAVENOUS | Status: DC | PRN
  Administered 2015-11-20: 120 mg via INTRAVENOUS

## 2015-11-20 MED ORDER — MIDAZOLAM HCL 5 MG/5ML IJ SOLN
INTRAMUSCULAR | Status: DC | PRN
  Administered 2015-11-20 (×2): 1 mg via INTRAVENOUS

## 2015-11-20 MED ORDER — LEVALBUTEROL HCL 0.63 MG/3ML IN NEBU
0.6300 mg | INHALATION_SOLUTION | Freq: Four times a day (QID) | RESPIRATORY_TRACT | Status: DC
Start: 2015-11-20 — End: 2015-11-21
  Administered 2015-11-20 – 2015-11-21 (×4): 0.63 mg via RESPIRATORY_TRACT
  Filled 2015-11-20 (×4): qty 3

## 2015-11-20 MED ORDER — PROPOFOL 10 MG/ML IV BOLUS
INTRAVENOUS | Status: AC
  Filled 2015-11-20: qty 40

## 2015-11-20 MED ORDER — CETYLPYRIDINIUM CHLORIDE 0.05 % MT LIQD
7.0000 mL | Freq: Two times a day (BID) | OROMUCOSAL | Status: DC
  Administered 2015-11-20 – 2015-11-22 (×5): 7 mL via OROMUCOSAL

## 2015-11-20 MED ORDER — BUPIVACAINE HCL 0.5 % IJ SOLN
INTRAMUSCULAR | Status: DC | PRN
  Administered 2015-11-20: 5 mL

## 2015-11-20 MED ORDER — ROCURONIUM BROMIDE 50 MG/5ML IV SOLN
INTRAVENOUS | Status: AC
  Filled 2015-11-20: qty 2

## 2015-11-20 MED ORDER — SUGAMMADEX SODIUM 200 MG/2ML IV SOLN
INTRAVENOUS | Status: AC
  Filled 2015-11-20: qty 2

## 2015-11-20 MED ORDER — ACETAMINOPHEN 500 MG PO TABS
1000.0000 mg | ORAL_TABLET | Freq: Four times a day (QID) | ORAL | Status: DC
  Administered 2015-11-20 – 2015-11-23 (×10): 1000 mg via ORAL
  Filled 2015-11-20 (×10): qty 2

## 2015-11-20 MED ORDER — DEXTROSE 5 % IV SOLN
1.5000 g | INTRAVENOUS | Status: AC
  Administered 2015-11-20: 1.5 g via INTRAVENOUS
  Filled 2015-11-20: qty 1.5

## 2015-11-20 MED ORDER — HYDROMORPHONE HCL 1 MG/ML IJ SOLN
0.2500 mg | INTRAMUSCULAR | Status: DC | PRN
  Administered 2015-11-20 (×4): 0.25 mg via INTRAVENOUS
  Administered 2015-11-20: 0.5 mg via INTRAVENOUS
  Administered 2015-11-20 (×2): 0.25 mg via INTRAVENOUS

## 2015-11-20 MED ORDER — TRAMADOL HCL 50 MG PO TABS: 50.0000 mg | ORAL_TABLET | Freq: Four times a day (QID) | ORAL | Status: DC | PRN

## 2015-11-20 MED ORDER — EPHEDRINE 5 MG/ML INJ
INTRAVENOUS | Status: AC
  Filled 2015-11-20: qty 10

## 2015-11-20 SURGICAL SUPPLY — 93 items
ADH SKN CLS APL DERMABOND .7 (GAUZE/BANDAGES/DRESSINGS)
APL SRG 22X2 LUM MLBL SLNT (VASCULAR PRODUCTS)
APL SRG 7X2 LUM MLBL SLNT (VASCULAR PRODUCTS)
APPLICATOR TIP COSEAL (VASCULAR PRODUCTS) IMPLANT
APPLICATOR TIP EXT COSEAL (VASCULAR PRODUCTS) IMPLANT
BLADE SURG 11 STRL SS (BLADE) IMPLANT
BRUSH CYTOL CELLEBRITY 1.5X140 (MISCELLANEOUS) IMPLANT
CANISTER SUCTION 2500CC (MISCELLANEOUS) ×3 IMPLANT
CATH KIT ON Q 5IN SLV (PAIN MANAGEMENT) IMPLANT
CATH KIT ON-Q SILVERSOAK 5 (CATHETERS) IMPLANT
CATH KIT ON-Q SILVERSOAK 5IN (CATHETERS) ×3 IMPLANT
CATH THORACIC 28FR (CATHETERS) ×1 IMPLANT
CATH THORACIC 36FR (CATHETERS) IMPLANT
CATH THORACIC 36FR RT ANG (CATHETERS) IMPLANT
CATH TROCAR 28FR (CATHETERS) ×1 IMPLANT
CLIP TI MEDIUM 6 (CLIP) ×3 IMPLANT
CONN ST 1/4X3/8  BEN (MISCELLANEOUS) ×2
CONN ST 1/4X3/8 BEN (MISCELLANEOUS) IMPLANT
CONT SPEC 4OZ CLIKSEAL STRL BL (MISCELLANEOUS) ×10 IMPLANT
COVER TABLE BACK 60X90 (DRAPES) ×3 IMPLANT
DERMABOND ADVANCED (GAUZE/BANDAGES/DRESSINGS)
DERMABOND ADVANCED .7 DNX12 (GAUZE/BANDAGES/DRESSINGS) IMPLANT
DRAIN CHANNEL 28F RND 3/8 FF (WOUND CARE) ×1 IMPLANT
DRAIN CHANNEL 32F RND 10.7 FF (WOUND CARE) IMPLANT
DRAPE LAPAROSCOPIC ABDOMINAL (DRAPES) ×3 IMPLANT
DRAPE WARM FLUID 44X44 (DRAPE) ×3 IMPLANT
DRILL BIT 7/64X5 (BIT) IMPLANT
DRSG COVADERM 4X6 (GAUZE/BANDAGES/DRESSINGS) ×1 IMPLANT
ELECT BLADE 4.0 EZ CLEAN MEGAD (MISCELLANEOUS) ×3
ELECT BLADE 6.5 EXT (BLADE) ×1 IMPLANT
ELECT REM PT RETURN 9FT ADLT (ELECTROSURGICAL) ×3
ELECTRODE BLDE 4.0 EZ CLN MEGD (MISCELLANEOUS) ×2 IMPLANT
ELECTRODE REM PT RTRN 9FT ADLT (ELECTROSURGICAL) ×2 IMPLANT
FORCEPS BIOP RJ4 1.8 (CUTTING FORCEPS) IMPLANT
GAUZE SPONGE 4X4 12PLY STRL (GAUZE/BANDAGES/DRESSINGS) ×3 IMPLANT
GLOVE BIO SURGEON STRL SZ 6.5 (GLOVE) ×6 IMPLANT
GOWN STRL REUS W/ TWL LRG LVL3 (GOWN DISPOSABLE) ×8 IMPLANT
GOWN STRL REUS W/TWL LRG LVL3 (GOWN DISPOSABLE) ×12
KIT BASIN OR (CUSTOM PROCEDURE TRAY) ×3 IMPLANT
KIT CLEAN ENDO COMPLIANCE (KITS) ×3 IMPLANT
KIT ROOM TURNOVER OR (KITS) ×3 IMPLANT
KIT SUCTION CATH 14FR (SUCTIONS) ×3 IMPLANT
MARKER SKIN DUAL TIP RULER LAB (MISCELLANEOUS) ×3 IMPLANT
NDL BIOPSY TRANSBRONCH 21G (NEEDLE) IMPLANT
NEEDLE BIOPSY TRANSBRONCH 21G (NEEDLE) IMPLANT
NS IRRIG 1000ML POUR BTL (IV SOLUTION) ×12 IMPLANT
OIL SILICONE PENTAX (PARTS (SERVICE/REPAIRS)) ×3 IMPLANT
PACK CHEST (CUSTOM PROCEDURE TRAY) ×3 IMPLANT
PAD ARMBOARD 7.5X6 YLW CONV (MISCELLANEOUS) ×9 IMPLANT
PASSER SUT SWANSON 36MM LOOP (INSTRUMENTS) ×1 IMPLANT
RELOAD GOLD ECHELON 45 (STAPLE) ×7 IMPLANT
SCISSORS LAP 5X35 DISP (ENDOMECHANICALS) IMPLANT
SEALANT PROGEL (MISCELLANEOUS) IMPLANT
SEALANT SURG COSEAL 4ML (VASCULAR PRODUCTS) IMPLANT
SEALANT SURG COSEAL 8ML (VASCULAR PRODUCTS) IMPLANT
SOLUTION ANTI FOG 6CC (MISCELLANEOUS) ×4 IMPLANT
SPONGE GAUZE 4X4 12PLY STER LF (GAUZE/BANDAGES/DRESSINGS) ×1 IMPLANT
STAPLER ECHELON POWERED (MISCELLANEOUS) ×1 IMPLANT
SUT PROLENE 3 0 SH DA (SUTURE) IMPLANT
SUT PROLENE 4 0 RB 1 (SUTURE)
SUT PROLENE 4-0 RB1 .5 CRCL 36 (SUTURE) IMPLANT
SUT SILK  1 MH (SUTURE) ×4
SUT SILK 1 MH (SUTURE) ×8 IMPLANT
SUT SILK 1 TIES 10X30 (SUTURE) IMPLANT
SUT SILK 2 0 SH (SUTURE) IMPLANT
SUT SILK 2 0SH CR/8 30 (SUTURE) IMPLANT
SUT SILK 3 0SH CR/8 30 (SUTURE) ×1 IMPLANT
SUT STEEL 1 (SUTURE) IMPLANT
SUT VIC AB 1 CTX 18 (SUTURE) ×2 IMPLANT
SUT VIC AB 1 CTX 36 (SUTURE) ×3
SUT VIC AB 1 CTX36XBRD ANBCTR (SUTURE) IMPLANT
SUT VIC AB 2-0 CTX 36 (SUTURE) ×1 IMPLANT
SUT VIC AB 2-0 UR6 27 (SUTURE) IMPLANT
SUT VIC AB 3-0 SH 8-18 (SUTURE) IMPLANT
SUT VIC AB 3-0 X1 27 (SUTURE) IMPLANT
SUT VICRYL 0 UR6 27IN ABS (SUTURE) IMPLANT
SUT VICRYL 2 TP 1 (SUTURE) ×1 IMPLANT
SWAB COLLECTION DEVICE MRSA (MISCELLANEOUS) IMPLANT
SYR 20ML ECCENTRIC (SYRINGE) ×3 IMPLANT
SYSTEM SAHARA CHEST DRAIN ATS (WOUND CARE) ×3 IMPLANT
TAPE CLOTH SURG 4X10 WHT LF (GAUZE/BANDAGES/DRESSINGS) ×1 IMPLANT
TAPE UMBILICAL COTTON 1/8X30 (MISCELLANEOUS) ×3 IMPLANT
TIP APPLICATOR SPRAY EXTEND 16 (VASCULAR PRODUCTS) IMPLANT
TOWEL OR 17X24 6PK STRL BLUE (TOWEL DISPOSABLE) ×6 IMPLANT
TOWEL OR 17X26 10 PK STRL BLUE (TOWEL DISPOSABLE) ×6 IMPLANT
TRAP SPECIMEN MUCOUS 40CC (MISCELLANEOUS) ×3 IMPLANT
TRAY FOLEY CATH 16FRSI W/METER (SET/KITS/TRAYS/PACK) ×3 IMPLANT
TROCAR XCEL 12X100 BLDLESS (ENDOMECHANICALS) ×1 IMPLANT
TROCAR XCEL BLUNT TIP 100MML (ENDOMECHANICALS) IMPLANT
TUBE ANAEROBIC SPECIMEN COL (MISCELLANEOUS) IMPLANT
TUBE CONNECTING 20X1/4 (TUBING) ×6 IMPLANT
TUNNELER SHEATH ON-Q 11GX8 DSP (PAIN MANAGEMENT) IMPLANT
WATER STERILE IRR 1000ML POUR (IV SOLUTION) ×6 IMPLANT

## 2015-11-20 NOTE — Anesthesia Procedure Notes (Addendum)
Procedure Name: Intubation Date/Time: 11/20/2015 7:44 AM Performed by: Layla Maw Pre-anesthesia Checklist: Patient identified, Emergency Drugs available, Suction available and Patient being monitored Patient Re-evaluated:Patient Re-evaluated prior to inductionOxygen Delivery Method: Circle System Utilized Preoxygenation: Pre-oxygenation with 100% oxygen Intubation Type: IV induction Ventilation: Two handed mask ventilation required, Mask ventilation with difficulty and Oral airway inserted - appropriate to patient size Laryngoscope Size: Mac and 4 Grade View: Grade I Tube type: Oral Endobronchial tube: Double lumen EBT and Left and 37 Fr Number of attempts: 1 Airway Equipment and Method: Stylet and Oral airway Placement Confirmation: ETT inserted through vocal cords under direct vision,  positive ETCO2 and breath sounds checked- equal and bilateral Secured at: 29 cm Tube secured with: Tape Dental Injury: Teeth and Oropharynx as per pre-operative assessment       Right IJ vein with needle

## 2015-11-20 NOTE — Brief Op Note (Addendum)
      [address]       [place] [address]             [phone]      11/20/2015  10:50 AM  PATIENT:  Jason Cotta Sr.  70 y.o. male  PRE-OPERATIVE DIAGNOSIS:  LUNG NODULE hypermetabolic left upper lobe  POST-OPERATIVE DIAGNOSIS:  LUNG NODULE, adenocarcinoma colonic origin   PROCEDURE:  Procedure(s):  VIDEO BRONCHOSCOPY (N/A)  VIDEO ASSISTED THORACOSCOPY/MINI THORACOSCOPY -Wedge Resection Left upper Lobe -Lymph Node Dissection -Placement of ON-Q Catheter  SURGEON:  Surgeon(s) and Role:    * [name], MD - Primary  PHYSICIAN ASSISTANT: Erin Barrett PA-C  ANESTHESIA:   general  EBL:  Total I/O In: 2400 [I.V.:2400] Out: 275 [Urine:225; Blood:50]  BLOOD ADMINISTERED:none  DRAINS: 28 Straight, 28 Blake Drain   LOCAL MEDICATIONS USED:  MARCAINE     SPECIMEN:  Source of Specimen:  Wedge Resection LUL  DISPOSITION OF SPECIMEN:  PATHOLOGY  COUNTS:  YES   DICTATION: .Dragon Dictation  PLAN OF CARE: Admit to inpatient   PATIENT DISPOSITION:  PACU - hemodynamically stable.   Delay start of Pharmacological VTE agent (>24hrs) due to surgical blood loss or risk of bleeding: yes

## 2015-11-20 NOTE — Transfer of Care (Signed)
Immediate Anesthesia Transfer of Care Note  Patient: [name]  Procedure(s) Performed: Procedure(s): VIDEO BRONCHOSCOPY (N/A) VIDEO ASSISTED THORACOSCOPY (VATS)/LUNG RESECTION (Left)  Patient Location: PACU  Anesthesia Type:General  Level of Consciousness: awake, alert , oriented and patient cooperative  Airway & Oxygen Therapy: Patient Spontanous Breathing and Patient connected to face mask oxygen  Post-op Assessment: Report given to RN, Post -op Vital signs reviewed and stable and Patient moving all extremities X 4  Post vital signs: Reviewed and stable  Last Vitals:  Filed Vitals:   11/20/15 0611 11/20/15 0612  BP: 146/76   Pulse: 72   Temp: 36.9 C   Resp:  16    Last Pain:  Filed Vitals:   11/20/15 0612  PainSc: 0-No pain      Patients Stated Pain Goal: 5 (25/27/12 9290)  Complications: No apparent anesthesia complications

## 2015-11-20 NOTE — H&P (Signed)
[address]       [place] [address]             [phone]                    Jason K Jurgensen Sr. [facility] Medical Record #[id number] Date of Birth: 01-15-1946  Referring:Dr Benay Spice Primary Care: [name], MD  Chief Complaint:   Left lung mass   History of Present Illness:    Jason KEMMERER Sr. 70 y.o. male is seen in the office for history of Rectal cancer, clinical stage III (uT3, uN2). And enlarging left upper lobe lung nodule   restaging CT scans 04/05/2015 revealed enlargement of a subpleural nodule in the posterior medial left upper lobe measuring 1.2 cm compared to a previous measurement of 0.8 cm. No evidence of metastatic rectal cancer.  A repeat chest CT on 07/12/2015 found the posterior medial left upper lobe nodule to be mildly increased   Current Activity/ Functional Status:  Patient is independent with mobility/ambulation, transfers, ADL's, IADL's.   Zubrod Score: At the time of surgery this patient's most appropriate activity status/level should be described as: '[]'$     0    Normal activity, no symptoms '[x]'$     1    Restricted in physical strenuous activity but ambulatory, able to do out light work '[]'$     2    Ambulatory and capable of self care, unable to do work activities, up and about               >50 % of waking hours                              '[]'$     3    Only limited self care, in bed greater than 50% of waking hours '[]'$     4    Completely disabled, no self care, confined to bed or chair '[]'$     5    Moribund   Past Medical History  Diagnosis Date  . Gout   . Hypertension   . Hyperplastic colon polyp   . Diverticulosis   . Anemia   . Arthritis   . ED (erectile dysfunction)   . History of radiation therapy 02/21/13-03/31/13    rectum 50.4Gy total dose  . rectal ca dx'd 02/01/13    rectal. Radiation and chemotherapy- remains on chemotherapy-next tx. 08-22-13.   . Sleep apnea     no cpap yet  . COPD (chronic obstructive  pulmonary disease) ([place])     pt denies  . Chronic renal insufficiency, stage II (mild)     Past Surgical History  Procedure Laterality Date  . Colonoscopy  2010    [facility]  . Eus N/A 02/03/2013    Procedure: LOWER ENDOSCOPIC ULTRASOUND (EUS);  Surgeon: Milus Banister, MD;  Location: [facility];  Service: Endoscopy;  Laterality: N/A;  . Laparoscopic low anterior resection N/A 05/20/2013    Procedure: LAPAROSCOPIC LOW ANTERIOR RESECTION WITH SPLENIC FLEXURE MOBILIZATION;  Surgeon: Leighton Ruff, MD;  Location: [facility];  Service: General;  Laterality: N/A;  . Ostomy N/A 05/20/2013    Procedure: diverting OSTOMY;  Surgeon: Leighton Ruff, MD;  Location: [facility];  Service: General;  Laterality: N/A;  . Colon surgery  05/20/2013  . Ileostomy closure N/A 08/24/2013    Procedure: CLOSURE OF LOOP ILEOSTOMY ;  Surgeon: Leighton Ruff, MD;  Location: [facility];  Service: General;  Laterality: N/A;    Family History  Problem Relation Age of Onset  . Hypertension Other   . Stroke Mother   . Hypertension Mother   . Stroke Father   . Hypertension Father   . Colon cancer Neg Hx     Social History   Social History  . Marital Status: Married    Spouse Name: Jason [name]  . Number of Children: 2  . Years of Education: 12   Occupational History  .      retired Insurance account manager   Social History Main Topics  . Smoking status: Former Smoker -- 0.50 packs/day for 10 years    Types: Cigarettes    Quit date: 07/07/1977  . Smokeless tobacco: Never Used  . Alcohol Use: No     Comment: pint of whisky every 2 weeks and occasional beer until about a month ago-Quit 10'14   . Drug Use: No  . Sexual Activity: Not on file   Other Topics Concern  . 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 Former Smoker -- 0.50 packs/day for 10 years  . Types:  Cigarettes  . Quit date: 07/07/1977  Smokeless tobacco  . Never Used    History  Alcohol Use No    Comment: pint of whisky every 2 weeks and occasional beer until about a month ago-Quit 10'14      Allergies  Allergen Reactions  . Amlodipine Other (See Comments)    Edema   . Nsaids     Asked by surgeon to add this medication class as intolerance due to patients renal insufficiency.    Current Facility-Administered Medications  Medication Dose Route Frequency Provider Last Rate Last Dose  . cefUROXime (ZINACEF) 1.5 g in dextrose 5 % 50 mL IVPB  1.5 g Intravenous 60 min Pre-Op [name], MD          Review of Systems:     Cardiac Review of Systems: Y or N  Chest Pain [  n ]  Resting SOB [n   ] Exertional SOB  [n  ]  Orthopnea [ n ]   Pedal Edema [ n  ]    Palpitations [ n ] Syncope  [ n ]   Presyncope [  n ]  General Review of Systems: [Y] = yes [  ]=no Constitional: recent weight change Blue.Reese  ];  Wt loss over the last 3 months [   ] anorexia [  ]; fatigue [  ]; nausea [  ]; night sweats [  ]; fever [  ]; or chills [  ];          Dental: poor dentition[  ]; Last Dentist visit:   Eye : blurred vision [  ]; diplopia [   ]; vision changes [  ];  Amaurosis fugax[  ]; Resp: cough [  ];  wheezing[  ];  hemoptysis[  ]; shortness of breath[  ]; paroxysmal nocturnal dyspnea[  ]; dyspnea on exertion[n  ]; or orthopnea[  n];  GI:  gallstones[  ], vomiting[  ];  dysphagia[  ]; melena[  ];  hematochezia [  ]; heartburn[  ];   Hx of  Colonoscopy[  ]y; GU: kidney stones [  ]; hematuria[  ];  dysuria [  ];  nocturia[  ];  history of     obstruction [  ]; urinary frequency [  ]             Skin: rash, swelling[  ];, hair loss[  ];  peripheral edema[  ];  or itching[  ]; Musculosketetal: myalgias[  ];  joint swelling[  ];  joint erythema[  ];  joint pain[  ];  back pain[  ];  Heme/Lymph: bruising[  ];  bleeding[  ];  anemia[  ];  Neuro: TIA[ n  headaches[  ];  stroke[  ];  vertigo[  ];   seizures[  ];   paresthesias[  ];  difficulty walking[n  ];  Psych:depression[  ]; anxiety[  ];  Endocrine: diabetes[  ];  thyroid dysfunction[  ];  Immunizations: Flu up to date Blue.Reese  ]; Pneumococcal up to date [  ]y;  Other:  Physical Exam: BP 146/76 mmHg  Pulse 72  Temp(Src) 98.4 F (36.9 C)  Resp 16  Wt 107.049 kg (236 lb)  SpO2 100%  PHYSICAL EXAMINATION: General appearance: alert, cooperative, appears older than stated age and moderately obese Head: Normocephalic, without obvious abnormality, atraumatic Neck: no adenopathy, no carotid bruit, no JVD, supple, symmetrical, trachea midline and thyroid not enlarged, symmetric, no tenderness/mass/nodules Lymph nodes: Cervical, supraclavicular, and axillary nodes normal. Resp: clear to auscultation bilaterally Back: symmetric, no curvature. ROM normal. No CVA tenderness. Cardio: regular rate and rhythm, S1, S2 normal, no murmur, click, rub or gallop GI: soft, non-tender; bowel sounds normal; no masses,  no organomegaly Extremities: extremities normal, atraumatic, no cyanosis or edema and Homans sign is negative, no sign of DVT Neurologic: Grossly normal Right lower abdominal incision fram ileostomy take doen Diagnostic Studies & Laboratory data:     Recent Radiology Findings:   Nm Pet Image Initial (pi) Skull Base To Thigh  10/22/2015  CLINICAL DATA:  Subsequent treatment strategy for rectal carcinoma. Enlarging lung nodules. EXAM: NUCLEAR MEDICINE PET SKULL BASE TO THIGH TECHNIQUE: 11.1 mCi F-18 FDG was injected intravenously. Full-ring PET imaging was performed from the skull base to thigh after the radiotracer. CT data was obtained and used for attenuation correction and anatomic localization. FASTING BLOOD GLUCOSE:  Value: 97 mg/dl COMPARISON:  CT 07/12/2015, PET-CT 02/17/2013 FINDINGS: NECK No hypermetabolic lymph nodes in the neck. CHEST Nodule in the superior medial aspect of the LEFT upper lobe measures 13 x 12 mm compared to 12 x  10 mm on CT of 07/12/2015. This nodule has associated metabolic activity with SUV max equal 5.7. The nodule abuts the pleural surface. Nodular thickening in the RIGHT upper lobe along the pleural surface measures 18 mm compared to 18 mm on prior remeasured. No clear metabolic activity associated with this nodular thickening. Within the RIGHT upper lobe rounded nodule measures 8 mm on image 67, series 4 not changed from 9 mm. This RIGHT upper lobe nodule has faint metabolic activity (SUV max less than 1). ABDOMEN/PELVIS No abnormal hypermetabolic activity within the liver, pancreas, adrenal glands, or spleen. No hypermetabolic lymph nodes in the abdomen or pelvis. High-density lucent lesion of the LEFT kidney (image 127, series 4 does not have associated metabolic activity There is metabolic activity through the second portion duodenum which is felt to be physiologic. No focal metabolic activity in the RIGHT colon anastomosis. SKELETON No focal hypermetabolic activity to suggest skeletal metastasis. IMPRESSION: 1. Hypermetabolic nodule in the medial aspect of the LEFT upper lobe is concerning for malignancy  with differential including primary carcinoma versus metastatic rectal carcinoma. 2. Stable nodule in the RIGHT upper lobe with very mild metabolic activity favors benign etiology. Stable nodular thickening in the RIGHT upper lobe without metabolic activity is also likely benign. 3. No evidence of local recurrence or metastasis in the abdomen pelvis. Electronically Signed   By: Suzy Bouchard M.D.   On: 10/22/2015 15:32     I have independently reviewed the above radiologic studies.  Recent Lab Findings: Lab Results  Component Value Date   WBC 7.1 11/16/2015   HGB 10.5* 11/16/2015   HCT 33.5* 11/16/2015   PLT 213 11/16/2015   GLUCOSE 100* 11/16/2015   CHOL 208* 06/04/2015   TRIG 203.0* 06/04/2015   HDL 34.80* 06/04/2015   LDLDIRECT 120.0 06/04/2015   LDLCALC 131* 05/29/2010   ALT 10* 11/16/2015    AST 13* 11/16/2015   NA 142 11/16/2015   K 4.2 11/16/2015   CL 102 11/16/2015   CREATININE 3.48* 11/16/2015   BUN 45* 11/16/2015   CO2 26 11/16/2015   TSH 1.90 08/22/2015   INR 1.11 11/16/2015   HGBA1C 6.0 12/20/2009   Chronic Kidney Disease   Stage I     GFR >90  Stage II    GFR 60-89  Stage IIIA GFR 45-59  Stage IIIB GFR 30-44  Stage IV   GFR 15-29  Stage V    GFR  <15  Lab Results  Component Value Date   CREATININE 3.48* 11/16/2015   Estimated Creatinine Clearance: 23.7 mL/min (by C-G formula based on Cr of 3.48).    Assessment / Plan:    Hypersomnia- Airway class IV. I can barely see the back of his mouth. Very crowded airway. Neck circ 17 inches  Rectal cancer, clinical stage III (uT3, uN2).  Initiation of concurrent Xeloda and radiation on 02/21/2013, completed 03/31/2013.   CEA normal 04/25/2013   Status post laparoscopic low anterior resection with diverting loop ileostomy 05/23/2013. Final pathology showed a 3.2 cm invasive adenocarcinoma with neoadjuvant related change. Tumor invaded into perirectal soft tissue. There was no lymphovascular or perineural invasion. 14 lymph nodes were negative for tumor. Surgical margins were negative. There was one hyperplastic polyp (ypT3, pN0).   Initiation of weekly 5-FU/leucovorin 08/01/2013   Ileostomy takedown 08/24/2013.   Restaging CTs 02/20/2014 negative for evidence of recurrent disease  restaging CT scans 04/05/2015 negative for evidence of recurrent disease  Surveillance colonoscopy 03/31/2014, status post removal of of a tubular adenoma from the ascending colo   Markedly elevated pretreatment CEA Indeterminate pulmonary/subpleural nodules and right hepatic dome lesion. Unchanged on the CT 04/25/2013, unchanged nodular lung lesions on the CT 02/20/2014 Renal insufficiency.Stage IV Acute on chronic renal failure during hospitalization November/December 2014. Improved following the ileostomy  takedown. Hypertension. Gout. Prolonged postoperative ileus following laparoscopic low anterior resection with diverting loop ileostomy 05/23/2013  With the small size but defining enlarging hypermetabolic left upper lobe lesion concern for malignancy is great. I have suggested proceeding with  . IR has declined to do  needle bx of lesion. Risks and options of surgery discussed with patient and wife .   The goals risks and alternatives of the planned surgical procedure bronchoscopy left VATS lung resection  have been discussed with the patient in detail. The risks of the procedure including death, infection, stroke, myocardial infarction, bleeding, blood transfusion have all been discussed specifically.  I have quoted Jason Cotta Sr. a 3 % of perioperative mortality and a complication rate as high  as 20 %. The patient's questions have been answered.Jason Cotta Sr. is willing  to proceed with the planned procedure.    [name] MD      [address] [place] [address] Office [phone]   Beeper [phone]  11/20/2015 7:22 AM

## 2015-11-20 NOTE — Consult Note (Signed)
Jason K Jobst Sr. Admit Date: 11/20/2015 11/20/2015 Rexene Agent Requesting Physician:  Servando Snare MD  Reason for Consult:  CKD4, periopertaive management HPI:  43M seen for the above issues. Patient has established CKD 4 with a creatinine ranging from 2.9-3.3 and sees Dr. Posey Pronto in our office. Last visit was on 5/10 where there was some evidence/concern for progression of GFR decline but subsequent testing was reassuring.  He has a history of rectal cancer status post neoadjuvant chemotherapy. PET scan earlier this year demonstrated a hypermetabolic nodule in the left upper lobe. Today he underwent VATS procedure with wedge resection and lymph node dissection. It appears that pathology was consistent with colonic adenocarcinoma.  We have been asked to comanage in the setting of advanced CKD. He is seen in the PACU, stable, and has no specific complaints. He has a Foley catheter with copious amber urine in the bag.  Home medicines include furosemide 80 mg twice daily, carvedilol, doxazosin.  CREATININE (mg/dL)  Date Value  02/20/2014 2.4*  12/19/2013 1.8*  11/29/2013 1.7*  10/24/2013 1.6*  10/12/2013 1.8*  10/05/2013 1.5*  09/22/2013 1.7*  09/08/2013 1.6*  08/15/2013 3.7*  08/08/2013 3.5*   CREAT (mg/dL)  Date Value  09/07/2013 1.75*   CREATININE, SER (mg/dL)  Date Value  11/16/2015 3.48*  08/22/2015 3.25*  06/04/2015 2.98*  09/05/2014 2.49*  08/17/2014 3.87*  08/02/2014 2.77*  09/24/2013 1.51*  09/19/2013 1.3  08/27/2013 1.80*  08/26/2013 1.65*  ] I/Os:  ROS Balance of 12 systems is negative w/ exceptions as above  PMH  Past Medical History  Diagnosis Date  . Gout   . Hypertension   . Hyperplastic colon polyp   . Diverticulosis   . Anemia   . Arthritis   . ED (erectile dysfunction)   . History of radiation therapy 02/21/13-03/31/13    rectum 50.4Gy total dose  . rectal ca dx'd 02/01/13    rectal. Radiation and chemotherapy- remains on chemotherapy-next tx.  08-22-13.   . Sleep apnea     no cpap yet  . COPD (chronic obstructive pulmonary disease) ([place])     pt denies  . Chronic renal insufficiency, stage II (mild)    PSH  Past Surgical History  Procedure Laterality Date  . Colonoscopy  2010    [facility]  . Eus N/A 02/03/2013    Procedure: LOWER ENDOSCOPIC ULTRASOUND (EUS);  Surgeon: Milus Banister, MD;  Location: [facility];  Service: Endoscopy;  Laterality: N/A;  . Laparoscopic low anterior resection N/A 05/20/2013    Procedure: LAPAROSCOPIC LOW ANTERIOR RESECTION WITH SPLENIC FLEXURE MOBILIZATION;  Surgeon: Leighton Ruff, MD;  Location: [facility];  Service: General;  Laterality: N/A;  . Ostomy N/A 05/20/2013    Procedure: diverting OSTOMY;  Surgeon: Leighton Ruff, MD;  Location: [facility];  Service: General;  Laterality: N/A;  . Colon surgery  05/20/2013  . Ileostomy closure N/A 08/24/2013    Procedure: CLOSURE OF LOOP ILEOSTOMY ;  Surgeon: Leighton Ruff, MD;  Location: [facility];  Service: General;  Laterality: N/A;   FH  Family History  Problem Relation Age of Onset  . Hypertension Other   . Stroke Mother   . Hypertension Mother   . Stroke Father   . Hypertension Father   . Colon cancer Neg Hx    SH  reports that he quit smoking about 38 years ago. His smoking use included Cigarettes. He has a 5 pack-year smoking history. He has never used smokeless tobacco. He reports that he does  not drink alcohol or use illicit drugs. Allergies  Allergies  Allergen Reactions  . Amlodipine Other (See Comments)    Edema   . Nsaids     Asked by surgeon to add this medication class as intolerance due to patients renal insufficiency.   Home medications Prior to Admission medications   Medication Sig Start Date End Date Taking? Authorizing Provider  acetaminophen (TYLENOL) 500 MG tablet Take 1,000 mg by mouth every 6 (six) hours as needed for moderate pain or headache.   Yes Historical Provider, MD  allopurinol (ZYLOPRIM) 300 MG tablet TAKE ONE  TABLET BY MOUTH ONCE DAILY IN THE MORNING 09/05/15  Yes [name], MD  carvedilol (COREG) 12.5 MG tablet Take 12.5 mg by mouth 2 (two) times daily with a meal.  08/03/15  Yes Historical Provider, MD  doxazosin (CARDURA) 8 MG tablet Take 1 tablet (8 mg total) by mouth at bedtime. 08/22/15  Yes [name], MD  furosemide (LASIX) 80 MG tablet TAKE ONE TABLET BY MOUTH ONCE DAILY 08/13/15  Yes Golden Circle, FNP  gabapentin (NEURONTIN) 300 MG capsule Take 300-600 mg by mouth as directed. Take '300mg'$ s morning and '600mg'$ s at bedtime 08/31/14  Yes Historical Provider, MD  KLOR-CON M20 20 MEQ tablet Take 1 tablet (20 mEq total) by mouth once. Patient taking differently: Take 20 mEq by mouth 2 (two) times daily.  08/22/15  Yes [name], MD  loperamide (IMODIUM) 2 MG capsule Take 1 capsule (2 mg total) by mouth as needed for diarrhea or loose stools. 2/70/35  Yes Leighton Ruff, MD  polycarbophil (FIBERCON) 625 MG tablet Take 1,250 mg by mouth daily.   Yes Historical Provider, MD  Vitamin D, Ergocalciferol, (DRISDOL) 50000 UNITS CAPS capsule Take 50,000 Units by mouth every 7 (seven) days.   Yes Historical Provider, MD    Current Medications Scheduled Meds: . bupivacaine 0.5 % ON-Q pump SINGLE CATH 400 mL  400 mL Other To OR  . carvedilol  12.5 mg Oral BID WC  . dextrose 5 % and 0.45 % NaCl with KCl 20 mEq/L      . doxazosin  8 mg Oral QHS  . fentaNYL   Intravenous Q4H  . labetalol      . labetalol  5 mg Intravenous Q15 min  . oxyCODONE       Continuous Infusions: . dextrose 5 % and 0.45 % NaCl with KCl 20 mEq/L 100 mL/hr at 11/20/15 1204   PRN Meds:.HYDROmorphone (DILAUDID) injection, HYDROmorphone (DILAUDID) injection, meperidine (DEMEROL) injection, ondansetron (ZOFRAN) IV, oxyCODONE **OR** oxyCODONE, oxyCODONE, traMADol  CBC  Recent Labs Lab 11/16/15 1551  WBC 7.1  HGB 10.5*  HCT 33.5*  MCV 84.6  PLT 009   Basic Metabolic Panel  Recent Labs Lab 11/16/15 1551  NA 142  K  4.2  CL 102  CO2 26  GLUCOSE 100*  BUN 45*  CREATININE 3.48*  CALCIUM 10.8*    Physical Exam  Blood pressure 164/109, pulse 64, temperature 97.8 F (36.6 C), resp. rate 18, weight 107.049 kg (236 lb), SpO2 100 %. GEN: NAD, groggy ENT: NCAT, [place] in place EYES: EOMI CV: RRR PULM: cTAB ABD: s/nt/nd SKIN: no rashes/lesions EXT:No LEE   Assessment 22M CKD4 s/p VATS and LUL Wedge resection of likely metastatic rectal Ca  1. CKD4: BL 2.9-3.3; sees Jason [name] at [facility] 2. Likely metatstatic rectal Ca s/p VATS and wedge resection 11/20/15 with TCTS 3. HTN  Plan 1. Agree with held fuorsemide and gentle IVFs  2.  Will follow along with you   Pearson Grippe MD [phone] pgr 11/20/2015, 3:31 PM

## 2015-11-20 NOTE — Progress Notes (Signed)
Pain controlled/sleeping at lengths, sbp+ dbp climbing since admission  whne bp then 150's/70's.Marland KitchenMarland KitchenMarland KitchenI spoke with the patient about this. W/ Dr Smith Robert MDA, orders rec'd for labetalol

## 2015-11-20 NOTE — Anesthesia Preprocedure Evaluation (Addendum)
Anesthesia Evaluation  Patient identified by MRN, date of birth, ID band Patient awake    Reviewed: Allergy & Precautions, NPO status , Patient's Chart, lab work & pertinent test results, reviewed documented beta blocker date and time   History of Anesthesia Complications Negative for: history of anesthetic complications  Airway Mallampati: IV  TM Distance: >3 FB Neck ROM: Full    Dental  (+) Edentulous Upper, Edentulous Lower, Dental Advisory Given   Pulmonary shortness of breath and with exertion, sleep apnea , COPD, former smoker,    breath sounds clear to auscultation + decreased breath sounds      Cardiovascular Exercise Tolerance: Poor hypertension, + DOE  Normal cardiovascular exam Rhythm:Regular Rate:Normal     Neuro/Psych  Neuromuscular disease    GI/Hepatic   Endo/Other  Morbid obesity  Renal/GU Renal InsufficiencyRenal disease     Musculoskeletal  (+) Arthritis ,   Abdominal (+) + obese,   Peds  Hematology  (+) anemia ,   Anesthesia Other Findings   Reproductive/Obstetrics                          Lab Results  Component Value Date   WBC 7.1 11/16/2015   HGB 10.5* 11/16/2015   HCT 33.5* 11/16/2015   MCV 84.6 11/16/2015   PLT 213 11/16/2015     Chemistry      Component Value Date/Time   NA 142 11/16/2015 1551   NA 138 02/20/2014 1432   K 4.2 11/16/2015 1551   K 3.5 02/20/2014 1432   CL 102 11/16/2015 1551   CL 101 12/09/2012 1048   CO2 26 11/16/2015 1551   CO2 30* 02/20/2014 1432   BUN 45* 11/16/2015 1551   BUN 36.0* 02/20/2014 1432   CREATININE 3.48* 11/16/2015 1551   CREATININE 2.4* 02/20/2014 1432   CREATININE 1.75* 09/07/2013 1412      Component Value Date/Time   CALCIUM 10.8* 11/16/2015 1551   CALCIUM 10.4 02/20/2014 1432   ALKPHOS 64 11/16/2015 1551   ALKPHOS 86 02/20/2014 1432   AST 13* 11/16/2015 1551   AST 22 02/20/2014 1432   ALT 10* 11/16/2015 1551    ALT 12 02/20/2014 1432   BILITOT 0.4 11/16/2015 1551   BILITOT 0.32 02/20/2014 1432     Lab Results  Component Value Date   HGBA1C 6.0 12/20/2009   BP Readings from Last 3 Encounters:  11/20/15 146/76  11/19/15 140/80  11/16/15 165/78    Anesthesia Physical Anesthesia Plan  ASA: III  Anesthesia Plan: General   Post-op Pain Management:    Induction: Intravenous  Airway Management Planned: Double Lumen EBT and Oral ETT  Additional Equipment: Arterial line and CVP  Intra-op Plan:   Post-operative Plan: Possible Post-op intubation/ventilation  Informed Consent: I have reviewed the patients History and Physical, chart, labs and discussed the procedure including the risks, benefits and alternatives for the proposed anesthesia with the patient or authorized representative who has indicated his/her understanding and acceptance.   Dental advisory given  Plan Discussed with: CRNA, Anesthesiologist and Surgeon  Anesthesia Plan Comments:       Anesthesia Quick Evaluation

## 2015-11-20 NOTE — Progress Notes (Addendum)
During CT dressing change, anterior PT cut with scissors. CTs clamped until tubes could be rerouted. Once rerouted, no air leak was present. Pt asymptomatic. MD made aware and orders received to remove anterior PT. Orders carried out.  Will continue to monitor pt closely.

## 2015-11-20 NOTE — Progress Notes (Signed)
TCTS BRIEF SICU PROGRESS NOTE  Day of Surgery  S/P Procedure(s) (LRB): VIDEO BRONCHOSCOPY (N/A) VIDEO ASSISTED THORACOSCOPY (VATS)/LUNG RESECTION (Left)   Stable early postop.  Anterior chest tube removed previously Adequate analgesia Breathing comfortably w/ O2 sats 97% on 3 L/min  Plan: Continue current plan  Rexene Alberts, MD 11/20/2015 7:36 PM

## 2015-11-20 NOTE — Progress Notes (Signed)
Patient ID: Jason METHOT Sr., male   DOB: 27-Jun-1946, 70 y.o.   MRN: [id number] EVENING ROUNDS NOTE :     [address]       [place] [address]             [phone]                 Day of Surgery Procedure(s) (LRB): VIDEO BRONCHOSCOPY (N/A) VIDEO ASSISTED THORACOSCOPY (VATS)/LUNG RESECTION (Left)  Total Length of Stay:  LOS: 0 days  BP 145/86 mmHg  Pulse 73  Temp(Src) 97.8 F (36.6 C)  Resp 16  Wt 107.049 kg (236 lb)  SpO2 97%  .Intake/Output      05/15 0701 - 05/16 0700 05/16 0701 - 05/17 0700   I.V. (mL/kg)  2400 (22.4)   Total Intake(mL/kg)  2400 (22.4)   Urine (mL/kg/hr)  1325 (1.1)   Blood  50 (0)   Chest Tube  190 (0.2)   Total Output   1565   Net   +835          . bupivacaine ON-Q pain pump    . dextrose 5 % and 0.45 % NaCl with KCl 20 mEq/L 100 mL/hr at 11/20/15 1204     Lab Results  Component Value Date   WBC 7.1 11/16/2015   HGB 10.5* 11/16/2015   HCT 33.5* 11/16/2015   PLT 213 11/16/2015   GLUCOSE 100* 11/16/2015   CHOL 208* 06/04/2015   TRIG 203.0* 06/04/2015   HDL 34.80* 06/04/2015   LDLDIRECT 120.0 06/04/2015   LDLCALC 131* 05/29/2010   ALT 10* 11/16/2015   AST 13* 11/16/2015   NA 142 11/16/2015   K 4.2 11/16/2015   CL 102 11/16/2015   CREATININE 3.48* 11/16/2015   BUN 45* 11/16/2015   CO2 26 11/16/2015   TSH 1.90 08/22/2015   PSA 1.86 08/10/2013   INR 1.11 11/16/2015   HGBA1C 6.0 12/20/2009   Good uop - renal has seen  Chest xray anterior tube dislodged , good position of posterior tube . Nurse reports that in changing dressing anterior tube was cut- will remove anterior chest tube   [name] MD  Beeper [phone] Office [phone] 11/20/2015 6:37 PM

## 2015-11-20 NOTE — Anesthesia Postprocedure Evaluation (Signed)
Anesthesia Post Note  Patient: Jason LOVEALL Sr.  Procedure(s) Performed: Procedure(s) (LRB): VIDEO BRONCHOSCOPY (N/A) VIDEO ASSISTED THORACOSCOPY (VATS)/LUNG RESECTION (Left)  Patient location during evaluation: PACU Anesthesia Type: General Level of consciousness: awake, sedated and patient cooperative Pain management: pain level controlled Vital Signs Assessment: post-procedure vital signs reviewed and stable Respiratory status: spontaneous breathing and respiratory function stable Cardiovascular status: stable Anesthetic complications: no    Last Vitals:  Filed Vitals:   11/20/15 1205 11/20/15 1208  BP: 151/88   Pulse: 65   Temp:    Resp: 14 13    Last Pain:  Filed Vitals:   11/20/15 1218  PainSc: 0-No pain                 Buffey Zabinski EDWARD

## 2015-11-21 ENCOUNTER — Inpatient Hospital Stay (HOSPITAL_COMMUNITY): Payer: Medicare Other

## 2015-11-21 ENCOUNTER — Encounter (HOSPITAL_COMMUNITY): Payer: Self-pay | Admitting: Cardiothoracic Surgery

## 2015-11-21 LAB — BLOOD GAS, ARTERIAL
ACID-BASE EXCESS: 0.8 mmol/L (ref 0.0–2.0)
Bicarbonate: 25.5 mEq/L — ABNORMAL HIGH (ref 20.0–24.0)
DRAWN BY: 244861
O2 CONTENT: 2 L/min
O2 SAT: 98.8 %
PATIENT TEMPERATURE: 98.6
PCO2 ART: 45.4 mmHg — AB (ref 35.0–45.0)
TCO2: 26.9 mmol/L (ref 0–100)
pH, Arterial: 7.368 (ref 7.350–7.450)
pO2, Arterial: 130 mmHg — ABNORMAL HIGH (ref 80.0–100.0)

## 2015-11-21 LAB — CBC
HCT: 32.7 % — ABNORMAL LOW (ref 39.0–52.0)
Hemoglobin: 10.2 g/dL — ABNORMAL LOW (ref 13.0–17.0)
MCH: 26.8 pg (ref 26.0–34.0)
MCHC: 31.2 g/dL (ref 30.0–36.0)
MCV: 85.8 fL (ref 78.0–100.0)
PLATELETS: 231 10*3/uL (ref 150–400)
RBC: 3.81 MIL/uL — AB (ref 4.22–5.81)
RDW: 15.2 % (ref 11.5–15.5)
WBC: 15.7 10*3/uL — AB (ref 4.0–10.5)

## 2015-11-21 LAB — BASIC METABOLIC PANEL
Anion gap: 11 (ref 5–15)
BUN: 38 mg/dL — ABNORMAL HIGH (ref 6–20)
CALCIUM: 9.1 mg/dL (ref 8.9–10.3)
CO2: 25 mmol/L (ref 22–32)
CREATININE: 2.9 mg/dL — AB (ref 0.61–1.24)
Chloride: 101 mmol/L (ref 101–111)
GFR calc non Af Amer: 21 mL/min — ABNORMAL LOW (ref >60)
GFR, EST AFRICAN AMERICAN: 24 mL/min — AB (ref >60)
Glucose, Bld: 117 mg/dL — ABNORMAL HIGH (ref 65–99)
Potassium: 5.4 mmol/L — ABNORMAL HIGH (ref 3.5–5.1)
Sodium: 137 mmol/L (ref 135–145)

## 2015-11-21 MED ORDER — LEVALBUTEROL HCL 0.63 MG/3ML IN NEBU
0.6300 mg | INHALATION_SOLUTION | Freq: Two times a day (BID) | RESPIRATORY_TRACT | Status: DC
  Administered 2015-11-22: 0.63 mg via RESPIRATORY_TRACT
  Filled 2015-11-21 (×2): qty 3

## 2015-11-21 MED ORDER — ENOXAPARIN SODIUM 30 MG/0.3ML ~~LOC~~ SOLN
30.0000 mg | Freq: Every day | SUBCUTANEOUS | Status: DC
  Administered 2015-11-21 – 2015-11-23 (×3): 30 mg via SUBCUTANEOUS
  Filled 2015-11-21 (×3): qty 0.3

## 2015-11-21 MED ORDER — DEXTROSE-NACL 5-0.45 % IV SOLN: INTRAVENOUS | Status: DC

## 2015-11-21 NOTE — Plan of Care (Signed)
Problem: Activity: Goal: Risk for activity intolerance will decrease Outcome: Progressing Ambulated 300 feet on room air with no assist morning of POD #1.  Problem: Respiratory: Goal: Respiratory status will improve Outcome: Progressing Patient weaned to room air. sats 98%

## 2015-11-21 NOTE — Progress Notes (Signed)
Patient ID: Jason GAN Sr., male   DOB: Nov 11, 1945, 70 y.o.   MRN: [id number] EVENING ROUNDS NOTE :     [address]       [place] [address]             [phone]                 1 Day Post-Op Procedure(s) (LRB): VIDEO BRONCHOSCOPY (N/A) VIDEO ASSISTED THORACOSCOPY (VATS)/LUNG RESECTION (Left)  Total Length of Stay:  LOS: 1 day  BP 113/63 mmHg  Pulse 76  Temp(Src) 98.4 F (36.9 C) (Oral)  Resp 8  Ht '5\' 8"'$  (1.727 m)  Wt 107.5 kg (236 lb 15.9 oz)  BMI 36.04 kg/m2  SpO2 98%  .Intake/Output      05/16 0701 - 05/17 0700 05/17 0701 - 05/18 0700   P.O. 1050 950   I.V. (mL/kg) 3750 (34.9) 487.5 (4.5)   IV Piggyback 100    Total Intake(mL/kg) 4900 (45.6) 1437.5 (13.4)   Urine (mL/kg/hr) 2250 (0.9) 775 (0.7)   Blood 50 (0)    Chest Tube 280 (0.1) 70 (0.1)   Total Output 2580 845   Net +2320 +592.5          . bupivacaine ON-Q pain pump    . dextrose 5 % and 0.45% NaCl 50 mL/hr at 11/21/15 1400     Lab Results  Component Value Date   WBC 15.7* 11/21/2015   HGB 10.2* 11/21/2015   HCT 32.7* 11/21/2015   PLT 231 11/21/2015   GLUCOSE 117* 11/21/2015   CHOL 208* 06/04/2015   TRIG 203.0* 06/04/2015   HDL 34.80* 06/04/2015   LDLDIRECT 120.0 06/04/2015   LDLCALC 131* 05/29/2010   ALT 10* 11/16/2015   AST 13* 11/16/2015   NA 137 11/21/2015   K 5.4* 11/21/2015   CL 101 11/21/2015   CREATININE 2.90* 11/21/2015   BUN 38* 11/21/2015   CO2 25 11/21/2015   TSH 1.90 08/22/2015   PSA 1.86 08/10/2013   INR 1.11 11/16/2015   HGBA1C 6.0 12/20/2009   Stable day, respiratory effort improved  Foley in place - uop 0.7 ml/kg/min  [name] MD  Beeper [phone] Office [phone] 11/21/2015 4:40 PM

## 2015-11-21 NOTE — Progress Notes (Signed)
Admit: 11/20/2015 LOS: 1  64M CKD4 s/p VATS and LUL Wedge resection o5/16 of likely metastatic rectal Ca  Subjective:  No new events UOP excellent SCr stable K 5.4   05/16 0701 - 05/17 0700 In: 2202 [P.O.:1050; I.V.:3750; IV Piggyback:100] Out: 2580 [Urine:2250; Blood:50; Chest Tube:280]  [facility] Weights   11/20/15 0611 11/21/15 0540  Weight: 107.049 kg (236 lb) 107.5 kg (236 lb 15.9 oz)    Scheduled Meds: . acetaminophen  1,000 mg Oral Q6H   Or  . acetaminophen (TYLENOL) oral liquid 160 mg/5 mL  1,000 mg Oral Q6H  . allopurinol  300 mg Oral Daily  . antiseptic oral rinse  7 mL Mouth Rinse BID  . bisacodyl  10 mg Oral Daily  . bupivacaine 0.5 % ON-Q pump SINGLE CATH 400 mL  400 mL Other To OR  . carvedilol  12.5 mg Oral BID WC  . doxazosin  8 mg Oral QHS  . enoxaparin (LOVENOX) injection  30 mg Subcutaneous Daily  . fentaNYL   Intravenous Q4H  . gabapentin  300 mg Oral Daily  . gabapentin  600 mg Oral QHS  . levalbuterol  0.63 mg Nebulization Q6H  . senna-docusate  1 tablet Oral QHS   Continuous Infusions: . bupivacaine ON-Q pain pump    . dextrose 5 % and 0.45% NaCl 50 mL/hr (11/21/15 0845)   PRN Meds:.acetaminophen, diphenhydrAMINE **OR** diphenhydrAMINE, labetalol, naloxone **AND** sodium chloride flush, ondansetron (ZOFRAN) IV, ondansetron (ZOFRAN) IV, oxyCODONE, traMADol  Current Labs: reviewed    Physical Exam:  Blood pressure 139/80, pulse 70, temperature 98.7 F (37.1 C), temperature source Oral, resp. rate 10, height '5\' 8"'$  (1.727 m), weight 107.5 kg (236 lb 15.9 oz), SpO2 100 %. NAD, breathign comfortably RRR, nl s1s2 Clear Anteriorly No LEE Foley in place Nonfocao AAOx3 NCAT EOMI  A 1. CKD4: BL 2.9-3.3; sees Patel at [facility] 2. Likely metatstatic rectal Ca s/p VATS and wedge resection 11/20/15 with TCTS 3. HTN 4. Hyperkalemia, mild  P 1. Cont to hold lasix for anoterh 24h 2. Stop IVFs when taking PO 3. Watch K   Pearson Grippe MD 11/21/2015, 9:02  AM   Recent Labs Lab 11/16/15 1551 11/21/15 0412  NA 142 137  K 4.2 5.4*  CL 102 101  CO2 26 25  GLUCOSE 100* 117*  BUN 45* 38*  CREATININE 3.48* 2.90*  CALCIUM 10.8* 9.1    Recent Labs Lab 11/16/15 1551 11/21/15 0412  WBC 7.1 15.7*  HGB 10.5* 10.2*  HCT 33.5* 32.7*  MCV 84.6 85.8  PLT 213 231

## 2015-11-21 NOTE — Progress Notes (Signed)
Patient ID: Jason WATERWORTH Sr., male   DOB: [birth year], 70 y.o.   MRN: [id number] TCTS DAILY ICU PROGRESS NOTE                   [address]            [place], [address]          [phone]   1 Day Post-Op Procedure(s) (LRB): VIDEO BRONCHOSCOPY (N/A) VIDEO ASSISTED THORACOSCOPY (VATS)/LUNG RESECTION (Left)  Total Length of Stay:  LOS: 1 day   Subjective: Up moving around good resp effort and pain control  Objective: Vital signs in last 24 hours: Temp:  [97.8 F (36.6 C)-98.7 F (37.1 C)] 98.7 F (37.1 C) (05/17 0741) Pulse Rate:  [57-82] 70 (05/17 0734) Cardiac Rhythm:  [-] Normal sinus rhythm (05/17 0600) Resp:  [9-21] 10 (05/17 0735) BP: (117-174)/(68-135) 139/80 mmHg (05/17 0734) SpO2:  [97 %-100 %] 97 % (05/17 0700) Arterial Line BP: (126-192)/(65-95) 148/70 mmHg (05/17 0700) Weight:  [107.5 kg (236 lb 15.9 oz)] 107.5 kg (236 lb 15.9 oz) (05/17 0540)  Filed Weights   11/20/15 0611 11/21/15 0540  Weight: 107.049 kg (236 lb) 107.5 kg (236 lb 15.9 oz)    Weight change: 0.451 kg (15.9 oz)   Hemodynamic parameters for last 24 hours:    Intake/Output from previous day: 05/16 0701 - 05/17 0700 In: 4900 [P.O.:1050; I.V.:3750; IV Piggyback:100] Out: 2580 [Urine:2250; Blood:50; Chest Tube:280]  Intake/Output this shift:    Current Meds: Scheduled Meds: . acetaminophen  1,000 mg Oral Q6H   Or  . acetaminophen (TYLENOL) oral liquid 160 mg/5 mL  1,000 mg Oral Q6H  . allopurinol  300 mg Oral Daily  . antiseptic oral rinse  7 mL Mouth Rinse BID  . bisacodyl  10 mg Oral Daily  . bupivacaine 0.5 % ON-Q pump SINGLE CATH 400 mL  400 mL Other To OR  . carvedilol  12.5 mg Oral BID WC  . doxazosin  8 mg Oral QHS  . fentaNYL   Intravenous Q4H  . gabapentin  300 mg Oral Daily  . gabapentin  600 mg Oral QHS  . levalbuterol  0.63 mg Nebulization Q6H  . senna-docusate  1 tablet Oral QHS   Continuous Infusions: . bupivacaine ON-Q pain pump    . dextrose 5  % and 0.45 % NaCl with KCl 20 mEq/L 100 mL/hr at 11/21/15 0700   PRN Meds:.acetaminophen, diphenhydrAMINE **OR** diphenhydrAMINE, labetalol, naloxone **AND** sodium chloride flush, ondansetron (ZOFRAN) IV, ondansetron (ZOFRAN) IV, oxyCODONE, potassium chloride, traMADol  General appearance: alert, cooperative and no distress Neurologic: intact Heart: regular rate and rhythm, S1, S2 normal, no murmur, click, rub or gallop Lungs: diminished breath sounds bibasilar Abdomen: soft, non-tender; bowel sounds normal; no masses,  no organomegaly Extremities: extremities normal, atraumatic, no cyanosis or edema Wound: no air leak  Lab Results: CBC: Recent Labs  11/21/15 0412  WBC 15.7*  HGB 10.2*  HCT 32.7*  PLT 231   BMET:  Recent Labs  11/21/15 0412  NA 137  K 5.4*  CL 101  CO2 25  GLUCOSE 117*  BUN 38*  CREATININE 2.90*  CALCIUM 9.1    PT/INR: No results for input(s): LABPROT, INR in the last 72 hours. Radiology: Dg Chest Port 1 View  11/21/2015  CLINICAL DATA:  Status post video-assisted thoracoscopy with left lung partial lobectomy. EXAM: PORTABLE CHEST 1 VIEW COMPARISON:  Portable chest x-ray of Nov 20, 2015 FINDINGS: The right lung is  adequately inflated and clear. On the left there is decreased perihilar density. There is no pleural effusion or pneumothorax. The chest tube is in stable position. The heart is normal in size. The pulmonary vascularity is not engorged. The right internal jugular venous catheter tip projects over the midportion of the SVC. IMPRESSION: Improved appearance of both lungs. Decreased pulmonary interstitial edema and bilateral subsegmental atelectasis. No pneumothorax The remaining support tubes are in reasonable position. Electronically Signed   By: David  Martinique M.D.   On: 11/21/2015 08:01   Dg Chest Port 1 View  11/20/2015  CLINICAL DATA:  Z09 (ICD-10-CM) - S/P lung surgery, follow-up examPost op chestS/P lung surgery, follow-up exam [id number] EXAM:  PORTABLE CHEST 1 VIEW COMPARISON:  11/16/2015 FINDINGS: Since the prior exam, left-sided thoracic surgery has been performed. There is a chest tube that resides along the external margin of the thoracic cage projecting over the lower left scapula. Another chest tube has its tip near the medial left apex. There is no convincing pneumothorax. There is some airspace type opacity extending laterally and superiorly from the left hilum, likely atelectasis there is also mild atelectasis at the lung bases and adjacent to the minor fissure on the right. No pulmonary edema. Cardiac silhouette is normal in size and configuration. No mediastinal or hilar masses. Right internal jugular central venous line has its tip in the lower superior vena cava. IMPRESSION: 1. Status post left thoracic/lung surgery. 2. Mild left perihilar atelectasis with mild basilar atelectasis bilaterally. No pulmonary edema. 3. No pneumothorax. 4. There are 2 chest tubes, 1 with its tip near the left apex and the other projecting outside of thoracic cavity. Electronically Signed   By: Lajean Manes M.D.   On: 11/20/2015 12:51     Assessment/Plan: S/P Procedure(s) (LRB): VIDEO BRONCHOSCOPY (N/A) VIDEO ASSISTED THORACOSCOPY (VATS)/LUNG RESECTION (Left) Mobilize See progression orders Expected Acute  Blood - loss Anemia Renal function unchanged k removed from iv fluid     [name] 11/21/2015 8:32 AM

## 2015-11-22 ENCOUNTER — Inpatient Hospital Stay (HOSPITAL_COMMUNITY): Payer: Medicare Other

## 2015-11-22 LAB — CBC
HEMATOCRIT: 29.4 % — AB (ref 39.0–52.0)
HEMOGLOBIN: 9.4 g/dL — AB (ref 13.0–17.0)
MCH: 27.8 pg (ref 26.0–34.0)
MCHC: 32 g/dL (ref 30.0–36.0)
MCV: 87 fL (ref 78.0–100.0)
PLATELETS: 189 10*3/uL (ref 150–400)
RBC: 3.38 MIL/uL — AB (ref 4.22–5.81)
RDW: 15.6 % — ABNORMAL HIGH (ref 11.5–15.5)
WBC: 12.1 10*3/uL — AB (ref 4.0–10.5)

## 2015-11-22 LAB — COMPREHENSIVE METABOLIC PANEL
ALBUMIN: 2.7 g/dL — AB (ref 3.5–5.0)
ALK PHOS: 52 U/L (ref 38–126)
ALT: 12 U/L — ABNORMAL LOW (ref 17–63)
ANION GAP: 9 (ref 5–15)
AST: 21 U/L (ref 15–41)
BUN: 36 mg/dL — ABNORMAL HIGH (ref 6–20)
CALCIUM: 8.5 mg/dL — AB (ref 8.9–10.3)
CHLORIDE: 99 mmol/L — AB (ref 101–111)
CO2: 25 mmol/L (ref 22–32)
Creatinine, Ser: 2.81 mg/dL — ABNORMAL HIGH (ref 0.61–1.24)
GFR calc non Af Amer: 21 mL/min — ABNORMAL LOW (ref >60)
GFR, EST AFRICAN AMERICAN: 25 mL/min — AB (ref >60)
GLUCOSE: 109 mg/dL — AB (ref 65–99)
POTASSIUM: 4.6 mmol/L (ref 3.5–5.1)
SODIUM: 133 mmol/L — AB (ref 135–145)
Total Bilirubin: 0.3 mg/dL (ref 0.3–1.2)
Total Protein: 5.9 g/dL — ABNORMAL LOW (ref 6.5–8.1)

## 2015-11-22 MED ORDER — LEVALBUTEROL HCL 0.63 MG/3ML IN NEBU: 0.6300 mg | INHALATION_SOLUTION | Freq: Four times a day (QID) | RESPIRATORY_TRACT | Status: DC | PRN

## 2015-11-22 NOTE — Progress Notes (Signed)
Patient ID: Jason WICKENS Sr., male   DOB: 05-14-1946, 70 y.o.   MRN: [id number] TCTS DAILY ICU PROGRESS NOTE                   [address]            [place] [address]          [phone]   2 Days Post-Op Procedure(s) (LRB): VIDEO BRONCHOSCOPY (N/A) VIDEO ASSISTED THORACOSCOPY (VATS)/LUNG RESECTION (Left)  Total Length of Stay:  LOS: 2 days   Subjective: Feels well, ambulating   Objective: Vital signs in last 24 hours: Temp:  [97.8 F (36.6 C)-98.7 F (37.1 C)] 98.7 F (37.1 C) (05/18 0739) Pulse Rate:  [64-95] 82 (05/18 0600) Cardiac Rhythm:  [-] Normal sinus rhythm (05/18 0400) Resp:  [8-23] 19 (05/18 0600) BP: (102-135)/(47-94) 126/69 mmHg (05/18 0500) SpO2:  [94 %-100 %] 100 % (05/18 0750)  Filed Weights   11/20/15 0611 11/21/15 0540  Weight: 107.049 kg (236 lb) 107.5 kg (236 lb 15.9 oz)    Weight change:    Hemodynamic parameters for last 24 hours:    Intake/Output from previous day: 05/17 0701 - 05/18 0700 In: 2547.5 [P.O.:1310; I.V.:1237.5] Out: 3345 [Urine:3225; Chest Tube:120]  Intake/Output this shift:    Current Meds: Scheduled Meds: . acetaminophen  1,000 mg Oral Q6H   Or  . acetaminophen (TYLENOL) oral liquid 160 mg/5 mL  1,000 mg Oral Q6H  . allopurinol  300 mg Oral Daily  . antiseptic oral rinse  7 mL Mouth Rinse BID  . bisacodyl  10 mg Oral Daily  . carvedilol  12.5 mg Oral BID WC  . doxazosin  8 mg Oral QHS  . enoxaparin (LOVENOX) injection  30 mg Subcutaneous Daily  . fentaNYL   Intravenous Q4H  . gabapentin  300 mg Oral Daily  . gabapentin  600 mg Oral QHS  . senna-docusate  1 tablet Oral QHS   Continuous Infusions: . bupivacaine ON-Q pain pump    . dextrose 5 % and 0.45% NaCl 50 mL/hr at 11/21/15 1400   PRN Meds:.acetaminophen, diphenhydrAMINE **OR** diphenhydrAMINE, labetalol, levalbuterol, naloxone **AND** sodium chloride flush, ondansetron (ZOFRAN) IV, ondansetron (ZOFRAN) IV, oxyCODONE, traMADol  General  appearance: alert, cooperative and no distress Neurologic: intact Heart: regular rate and rhythm, S1, S2 normal, no murmur, click, rub or gallop Lungs: diminished breath sounds bibasilar Abdomen: soft, non-tender; bowel sounds normal; no masses,  no organomegaly Extremities: extremities normal, atraumatic, no cyanosis or edema and Homans sign is negative, no sign of DVT Wound: no air leak  Lab Results: CBC: Recent Labs  11/21/15 0412 11/22/15 0415  WBC 15.7* 12.1*  HGB 10.2* 9.4*  HCT 32.7* 29.4*  PLT 231 189   BMET:  Recent Labs  11/21/15 0412 11/22/15 0415  NA 137 133*  K 5.4* 4.6  CL 101 99*  CO2 25 25  GLUCOSE 117* 109*  BUN 38* 36*  CREATININE 2.90* 2.81*  CALCIUM 9.1 8.5*    PT/INR: No results for input(s): LABPROT, INR in the last 72 hours. Radiology: Dg Chest Port 1 View  11/22/2015  CLINICAL DATA:  Status post wedge resection of the left lung. EXAM: PORTABLE CHEST 1 VIEW COMPARISON:  Portable chest x-ray of Nov 21, 2015. FINDINGS: The right lung is adequately inflated and clear. On the left the lung is well-expanded. There is no pneumothorax or pleural effusion. Parenchymal density in the left parahilar region superiorly remains. The left-sided chest tube is  in stable position. The right internal jugular venous catheter tip projects over the midportion of the SVC. IMPRESSION: Improved appearance of the left lung. Persistent left perihilar atelectasis superiorly. No pneumothorax. The support tubes are in stable position. Electronically Signed   By: David  Martinique M.D.   On: 11/22/2015 07:36     Assessment/Plan: S/P Procedure(s) (LRB): VIDEO BRONCHOSCOPY (N/A) VIDEO ASSISTED THORACOSCOPY (VATS)/LUNG RESECTION (Left) Mobilize d/c tubes/lines Plan for transfer to step-down: see transfer orders Renal function at baseline Final path still pending D/c chest tube and foley To 3s     [name] 11/22/2015 8:26 AM

## 2015-11-22 NOTE — Progress Notes (Signed)
Admit: 11/20/2015 LOS: 2  67M CKD4 s/p VATS and LUL Wedge resection o5/16 of likely metastatic rectal Ca  Subjective:  No new events UOP excellent SCr stable K 4.6 Tolerating PO  05/17 0701 - 05/18 0700 In: 2547.5 [P.O.:1310; I.V.:1237.5] Out: 3345 [Urine:3225; Chest Tube:120]  Filed Weights   11/20/15 0611 11/21/15 0540  Weight: 107.049 kg (236 lb) 107.5 kg (236 lb 15.9 oz)    Scheduled Meds: . acetaminophen  1,000 mg Oral Q6H   Or  . acetaminophen (TYLENOL) oral liquid 160 mg/5 mL  1,000 mg Oral Q6H  . allopurinol  300 mg Oral Daily  . antiseptic oral rinse  7 mL Mouth Rinse BID  . bisacodyl  10 mg Oral Daily  . carvedilol  12.5 mg Oral BID WC  . doxazosin  8 mg Oral QHS  . enoxaparin (LOVENOX) injection  30 mg Subcutaneous Daily  . gabapentin  300 mg Oral Daily  . gabapentin  600 mg Oral QHS  . senna-docusate  1 tablet Oral QHS   Continuous Infusions: . bupivacaine ON-Q pain pump    . dextrose 5 % and 0.45% NaCl 50 mL/hr at 11/21/15 1400   PRN Meds:.acetaminophen, labetalol, levalbuterol, ondansetron (ZOFRAN) IV, oxyCODONE, traMADol  Current Labs: reviewed    Physical Exam:  Blood pressure 127/65, pulse 95, temperature 98.7 F (37.1 C), temperature source Oral, resp. rate 23, height '5\' 8"'$  (1.727 m), weight 107.5 kg (236 lb 15.9 oz), SpO2 100 %. NAD, breathign comfortably RRR, nl s1s2 Clear Anteriorly No LEE Foley in place Nonfocao AAOx3 NCAT EOMI  A 1. CKD4: BL 2.9-3.3; sees [facility]el at [facility]  2. Likely metatstatic rectal Ca s/p VATS and wedge resection 11/20/15 with TCTS 3. HTN 4. Hyperkalemia, mild  P 1. Resume home furosemide when tolerating PO consistently 2. Stop IVFs 3. Will sign off for now.     Pearson Grippe MD 11/22/2015, 9:34 AM   Recent Labs Lab 11/16/15 1551 11/21/15 0412 11/22/15 0415  NA 142 137 133*  K 4.2 5.4* 4.6  CL 102 101 99*  CO2 '26 25 25  '$ GLUCOSE 100* 117* 109*  BUN 45* 38* 36*  CREATININE 3.48* 2.90* 2.81*  CALCIUM  10.8* 9.1 8.5*    Recent Labs Lab 11/16/15 1551 11/21/15 0412 11/22/15 0415  WBC 7.1 15.7* 12.1*  HGB 10.5* 10.2* 9.4*  HCT 33.5* 32.7* 29.4*  MCV 84.6 85.8 87.0  PLT 213 231 189

## 2015-11-22 NOTE — Progress Notes (Addendum)
253mg of fentanyl wasted and witnessed by SLauretta GrillRN.

## 2015-11-22 NOTE — Op Note (Signed)
Jason [name], Jason [name] NO.:  [id number]  MEDICAL RECORD NO.:  [id number]  LOCATION:  [id number]                        FACILITY:  [facility]  PHYSICIAN:  [name], MD    DATE OF BIRTH:  17-Jun-1946  DATE OF PROCEDURE:  11/20/2015 DATE OF DISCHARGE:                              OPERATIVE REPORT   PREOPERATIVE DIAGNOSIS:  Right upper lobe lung mass.  POSTOPERATIVE DIAGNOSIS:  Right upper lobe lung mass.  Adenocarcinoma of colonic origin by frozen section.  PROCEDURE PERFORMED:  Bronchoscopy, left video-assisted thoracoscopy, mini thoracotomy, left upper lobe lung resection with node sampling, and placement of On-Q device.  SURGEON:  [name], MD.  FIRST ASSISTANT:  Ellwood Handler, PA.  BRIEF HISTORY:  The patient is a 70 year old male with chronic renal insufficiency who presents with known history of stage III rectal carcinoma for which he was previously treated with radiation chemotherapy and surgical resection.  He has been followed for several years.  On serial CT scans, a new hypermetabolic lesion appeared in the left upper lobe with concern for either lung primary or metastatic deposit.  The patient had no evidence of metastatic disease on any other CT scan or PET scan.  The nodule was in the posterior portion of the left upper lobe peripherally adjacent to the aorta.  Interventional Radiology declined attempting a needle biopsy.  The patient was referred for surgery for consideration of resection.  The patient has some limited pulmonary reserve, but is functional.  Risks and options of resection were discussed with him in detail and especially the increased risk due to his chronic renal insufficiency.  The patient was agreeable and signed informed consent.  DESCRIPTION OF PROCEDURE:  The patient underwent general endotracheal anesthesia with a double-lumen endotracheal tube without incident. Appropriate time-out was performed, and we proceeded with  bronchoscopy through his double lumen endotracheal tube and both the right and left tracheobronchial trees appeared without endobronchial lesions, and the tube was in good position.  The patient was turned in lateral decubitus position.  The confirmation of laterality was performed reviewing the patient's films and previous notes and the left side was marked.  The left side was prepped and draped in the usual sterile manner.  The left lung was collapsed.  Second time-out was performed.  We then proceeded with small incision anteriorly in approximately third intercostal space. Through this, a trocar was introduced.  Because of the patient's body habitus, a longer trocar was necessary.  He had some difficulty fully deflating the lung.  We then proceeded with a small incision posteriorly and through this incision and visualizing with the scope, we were able to palpate the lesion and proceeded with wedge resection using a 40 mm medium load staples.  On removing the specimen, the specimen was palpable in the wedge and appeared to have at least 2 cm margins by palpation.  We sent the specimen to pathology, and while waiting for the frozen section, sampled level 4, 5, and 10 lymph nodes and submitted them in separate specimen cups.  Initial frozen section confirmed adenocarcinoma of colonic origin though the middle portion of this specimen had microscopic positive, so we again expanded  our wedge resection and took a second suture line along the previous one working inferior, superior, and medial.  This again submitted to Pathology, and the margins were negative by frozen section.  We then tunneled an On-Q device subpleural along the posterior ribs.  A standard chest tube and a Blake drain were placed through the 2 port sites and the utility incision that we had created was then closed with interrupted 0 Vicryl, running 3-0 Vicryl in subcutaneous tissue, and a 3-0 subcuticular stitch in skin  edges.  Dry dressings were applied.  Sponge and needle count was reported as correct at the completion of the procedure.  The left lung was inflated nicely.  The patient was then awakened from anesthesia in the operating room and extubated and transferred to the recovery room for postoperative care.  He tolerated the procedure without obvious complication.  Estimated blood loss was 100 mL.  He maintained good urine output throughout the procedure.     [name], MD     EG/MEDQ  D:  11/21/2015  T:  11/22/2015  Job:  [id number]

## 2015-11-22 NOTE — Progress Notes (Signed)
      [address]       [place] [address]             [phone]      Resting comfortably  BP 150/79 mmHg  Pulse 80  Temp(Src) 98.3 F (36.8 C) (Oral)  Resp 13  Ht '5\' 8"'$  (1.727 m)  Wt 236 lb 15.9 oz (107.5 kg)  BMI 36.04 kg/m2  SpO2 98%   Intake/Output Summary (Last 24 hours) at 11/22/15 2058 Last data filed at 11/22/15 0800  Gross per 24 hour  Intake    500 ml  Output   2080 ml  Net  -1580 ml    Awaiting step down bed  Remo Lipps C. Roxan Hockey, MD [facility] [phone]

## 2015-11-23 ENCOUNTER — Inpatient Hospital Stay (HOSPITAL_COMMUNITY): Payer: Medicare Other

## 2015-11-23 LAB — CBC
HCT: 27.9 % — ABNORMAL LOW (ref 39.0–52.0)
Hemoglobin: 8.8 g/dL — ABNORMAL LOW (ref 13.0–17.0)
MCH: 26.9 pg (ref 26.0–34.0)
MCHC: 31.5 g/dL (ref 30.0–36.0)
MCV: 85.3 fL (ref 78.0–100.0)
Platelets: 209 10*3/uL (ref 150–400)
RBC: 3.27 MIL/uL — ABNORMAL LOW (ref 4.22–5.81)
RDW: 15.6 % — ABNORMAL HIGH (ref 11.5–15.5)
WBC: 9.3 10*3/uL (ref 4.0–10.5)

## 2015-11-23 LAB — BASIC METABOLIC PANEL
Anion gap: 9 (ref 5–15)
BUN: 42 mg/dL — ABNORMAL HIGH (ref 6–20)
CO2: 26 mmol/L (ref 22–32)
Calcium: 8.7 mg/dL — ABNORMAL LOW (ref 8.9–10.3)
Chloride: 102 mmol/L (ref 101–111)
Creatinine, Ser: 3.11 mg/dL — ABNORMAL HIGH (ref 0.61–1.24)
GFR calc Af Amer: 22 mL/min — ABNORMAL LOW (ref >60)
GFR calc non Af Amer: 19 mL/min — ABNORMAL LOW (ref >60)
Glucose, Bld: 91 mg/dL (ref 65–99)
Potassium: 4.6 mmol/L (ref 3.5–5.1)
Sodium: 137 mmol/L (ref 135–145)

## 2015-11-23 MED ORDER — OXYCODONE HCL 5 MG PO TABS
5.0000 mg | ORAL_TABLET | ORAL | Status: DC | PRN
  Administered 2015-11-23: 5 mg via ORAL
  Filled 2015-11-23: qty 1

## 2015-11-23 MED ORDER — TRAMADOL HCL 50 MG PO TABS: 50.0000 mg | ORAL_TABLET | Freq: Four times a day (QID) | ORAL | Status: DC | PRN

## 2015-11-23 MED ORDER — OXYCODONE HCL 5 MG PO TABS: 5.0000 mg | ORAL_TABLET | Freq: Four times a day (QID) | ORAL | Status: DC | PRN

## 2015-11-23 NOTE — Progress Notes (Addendum)
Patient ID: Jason ARQUETTE Sr., male   DOB: 06/22/1946, 70 y.o.   MRN: [id number] TCTS DAILY ICU PROGRESS NOTE                   [address]            [place] [address]          [phone]   3 Days Post-Op Procedure(s) (LRB): VIDEO BRONCHOSCOPY (N/A) VIDEO ASSISTED THORACOSCOPY (VATS)/LUNG RESECTION (Left)  Total Length of Stay:  LOS: 3 days   Subjective: Alert and without complaints  Objective: Vital signs in last 24 hours: Temp:  [98.3 F (36.8 C)-98.7 F (37.1 C)] 98.4 F (36.9 C) (05/19 0400) Pulse Rate:  [75-110] 79 (05/18 2300) Cardiac Rhythm:  [-] Normal sinus rhythm (05/18 1950) Resp:  [9-25] 25 (05/18 2300) BP: (110-150)/(59-102) 125/75 mmHg (05/18 2300) SpO2:  [97 %-100 %] 100 % (05/18 2300)  Filed Weights   11/20/15 0611 11/21/15 0540  Weight: 236 lb (107.049 kg) 236 lb 15.9 oz (107.5 kg)    Weight change:    Hemodynamic parameters for last 24 hours:    Intake/Output from previous day: 05/18 0701 - 05/19 0700 In: 120 [P.O.:120] Out: 450 [Urine:450]  Intake/Output this shift:    Current Meds: Scheduled Meds: . acetaminophen  1,000 mg Oral Q6H   Or  . acetaminophen (TYLENOL) oral liquid 160 mg/5 mL  1,000 mg Oral Q6H  . allopurinol  300 mg Oral Daily  . antiseptic oral rinse  7 mL Mouth Rinse BID  . bisacodyl  10 mg Oral Daily  . carvedilol  12.5 mg Oral BID WC  . doxazosin  8 mg Oral QHS  . enoxaparin (LOVENOX) injection  30 mg Subcutaneous Daily  . gabapentin  300 mg Oral Daily  . gabapentin  600 mg Oral QHS  . senna-docusate  1 tablet Oral QHS   Continuous Infusions: . bupivacaine ON-Q pain pump     PRN Meds:.acetaminophen, labetalol, levalbuterol, ondansetron (ZOFRAN) IV, oxyCODONE, traMADol  General appearance: alert and cooperative Neurologic: intact Heart: regular rate and rhythm, S1, S2 normal, no murmur, click, rub or gallop Lungs: clear to auscultation bilaterally Abdomen: soft, non-tender; bowel sounds  normal; no masses,  no organomegaly Extremities: extremities normal, atraumatic, no cyanosis or edema and Homans sign is negative, no sign of DVT Wound: intact chest tubes out  Lab Results: CBC: Recent Labs  11/22/15 0415 11/23/15 0513  WBC 12.1* 9.3  HGB 9.4* 8.8*  HCT 29.4* 27.9*  PLT 189 209   BMET:  Recent Labs  11/22/15 0415 11/23/15 0513  NA 133* 137  K 4.6 4.6  CL 99* 102  CO2 25 26  GLUCOSE 109* 91  BUN 36* 42*  CREATININE 2.81* 3.11*  CALCIUM 8.5* 8.7*    PT/INR: No results for input(s): LABPROT, INR in the last 72 hours. Radiology: No results found.   Assessment/Plan: S/P Procedure(s) (LRB): VIDEO BRONCHOSCOPY (N/A) VIDEO ASSISTED THORACOSCOPY (VATS)/LUNG RESECTION (Left) Mobilize waiting for bed outside sicu 24 hours Follow up chest xray pa and lat this am D/c central line and home  home later today or in am Renal function stable making urine  Rectal cancer ([place])   Staging form: Colon and Rectum, AJCC 7th Edition     Clinical stage from 11/23/2015: Stage IVA (T3, N2, M1a) - Signed by [name], MD on 11/23/2015 Path discussed with patient, has follow up in oncology office next week   [name]  11/23/2015 7:06 AM

## 2015-11-23 NOTE — Progress Notes (Signed)
Dc instructions given to pt at this time.  Pt verbalized understanding.  Pt has no s/s of any acute distress or c/o pain.

## 2015-11-23 NOTE — Discharge Summary (Signed)
Physician Discharge Summary  Patient ID: Jason [name]. MRN: [id number] DOB/AGE: Nov 12, 1945 70 y.o.  Admit date: 11/20/2015 Discharge date: 11/23/2015  Admission Diagnoses:  Discharge Diagnoses:  Active Problems:   S/P partial lobectomy of lung  Patient Active Problem List   Diagnosis Date Noted  . S/P partial lobectomy of lung 11/20/2015  . Hypersomnia 10/30/2015  . Lung nodule 10/30/2015  . Obesity 10/30/2015  . Exertional dyspnea 10/30/2015  . Routine general medical examination at a health care facility 06/04/2015  . Medicare annual wellness visit, subsequent 06/04/2015  . Hereditary and idiopathic peripheral neuropathy 09/14/2014  . Rectal cancer ([place]) 02/01/2013  . Pernicious anemia 07/20/2012  . Chronic renal insufficiency 07/31/2011  . Obstructive sleep apnea 07/29/2010  . GOUT 05/11/2007  . ERECTILE DYSFUNCTION, MILD 05/11/2007  . Essential hypertension 05/11/2007  . COLONIC POLYPS, HX OF 05/11/2007    History of Present Illness:  Jason LIUZZI [name]. 70 y.o. male is seen in the office for history of Rectal cancer, clinical stage III (uT3, uN2). And enlarging left upper lobe lung nodule   restaging CT scans 04/05/2015 revealed enlargement of a subpleural nodule in the posterior medial left upper lobe measuring 1.2 cm compared to a previous measurement of 0.8 cm. No evidence of metastatic rectal cancer.  A repeat chest CT on 07/12/2015 found the posterior medial left upper lobe nodule to be mildly increased   He was admitted electively this hospitalization for resection.  Discharged Condition: good  Hospital Course: The patient was admitted electively and taken to the operating room where he underwent the below described procedure. He tolerated well was taken to the postanesthesia care unit in stable condition. Due to his chronic renal insufficiency he was seen in nephrology consultation and they assisted with management. He is making good urine. Lasix will be  resumed at time of discharge per their instruction. All routine lines, monitors and drainage devices have been discontinued in the standard fashion. Pathology is noted in the report below. Incisions are healing without evidence of infection. He is tolerating diet and routine activity. He is felt to be stable for discharge on today's date.  Consults: nephrology  Significant Diagnostic Studies: routine postop labs/CXR  Treatments: surgery:  PHYSICIAN: [name], MD DATE OF BIRTH: June 18, 1946  DATE OF PROCEDURE: 11/20/2015 DATE OF DISCHARGE:   OPERATIVE REPORT   PREOPERATIVE DIAGNOSIS: Right upper lobe lung mass.  POSTOPERATIVE DIAGNOSIS: Right upper lobe lung mass. Adenocarcinoma of colonic origin by frozen section.  PROCEDURE PERFORMED: Bronchoscopy, left video-assisted thoracoscopy, mini thoracotomy, left upper lobe lung resection with node sampling, and placement of On-Q device.  SURGEON: [name], MD.  FIRST ASSISTANT: Ellwood Handler, PA.  Discharge Exam: Blood pressure 130/75, pulse 80, temperature 98.2 F (36.8 C), temperature source Oral, resp. rate 19, height '5\' 8"'$  (1.727 m), weight 236 lb 15.9 oz (107.5 kg), SpO2 98 %.   General appearance: alert and cooperative Neurologic: intact Heart: regular rate and rhythm, S1, S2 normal, no murmur, click, rub or gallop Lungs: clear to auscultation bilaterally Abdomen: soft, non-tender; bowel sounds normal; no masses, no organomegaly Extremities: extremities normal, atraumatic, no cyanosis or edema and Homans sign is negative, no sign of DVT Wound: intact chest tubes out  FINAL DIAGNOSIS Diagnosis 1. Lung, wedge biopsy/resection, LUL - METASTATIC ADENOCARCINOMA OF COLORECTAL PRIMARY SOURCE, 1 CM TUMOR NODULE. - MARGIN IS INVOLVED BY TUMOR (PLEASE SEE SPECIMEN NUMBER 2 FOR FINAL MARGIN STATUS). - SEE COMMENT. 2. Lung, biopsy, Additional LUL - BENIGN LUNG TISSUE. -  NO TUMOR SEEN. 3. Lymph node, biopsy, 4L - ONE BENIGN LYMPH NODE WITH NO TUMOR SEEN (0/1). 4. Lymph node, biopsy, 5L - ONE BENIGN LYMPH NODE WITH NO TUMOR SEEN (0/1). 5. Lymph node, biopsy, 10L - ONE BENIGN LYMPH NODE WITH NO TUMOR SEEN (0/1). Microscopic Comment 1. Immunohistochemical stains are performed. The tumor is positive for cytokeratin 20 and CDX-2 while it is negative for cytokeratin 7, TTF-1, and PSA. The tumor is also compared to the tumor in the previous case [phone]) and the tumors are morphologically similar. Based on the morphology and staining pattern, the findings are most consistent with metastatic adenocarcinoma of colorectal primary source. (RH:ecj 11/21/2015) Willeen Niece MD Pathologist, Electronic Signature (Case signed 11/22/2015) Intraoperative Diagnosis 1. FROZEN SECTION DIAGNOSIS: ADENOCARCINOMA. STAPLE MARGIN IS POSITIVE. (ZL) 2. FROZEN SECTION DIAGNOSIS: MARGINS ARE NEGATIVE. (ZL) Specimen Gross and Clinical Information 1 of 3 FINAL for [name], Dequavion K [name]. [phone]) Specimen(s) Obtained: 1. Lung, wedge biopsy/resection, LUL 2. Lung, biopsy, Additional LUL 3. Lymph node, biopsy, 4L 4. Lymph node, biopsy, 5L 5. Lymph node, biopsy, 10L Specimen Clinical Information 1. Lung nodule (nt) Gross 1. Specimen: Received fresh for frozen section is a left upper lobe wedge. Specimen integrity (intact/incised/disrupted): Intact with multiple stapled margins and a suture on the pleura. Size, weight: 6 grams, 4.0 x 3.0 x 1.0 cm. Pleura: Slightly gritty and irregular in the area of the suture, which is inked. Cut Surface: Sectioning reveals a soft, necrotic, ill-defined yellow-red possible lesion measuring approximately 1.0 x 1.0 x 0.6 cm. The lesion is grossly identified at the staple margin, approximately 0.1 cm from the margin and grossly abuts the inked pleura. Margin(s): The lesion is 0.1 cm from the margin. Block Summary: A = nearest margin for  frozen section B = representative lesion for frozen section C-F = remaining lesion submitted to include inked pleura G-H = uninvolved tissue The specimen is entirely submitted in eight blocks total 2. Received fresh for frozen section is a 3.5 x 2.5 x 0.5 cm irregularly shaped triangular portion of tissue mostly consistent of staple lines. The 3.0 cm edge is marked superior, 1.8 cm edge medial, and 2.5 cm edge marked inferior. The three margins are taken en face and the specimen entirely submitted for frozen section. Block Summary: A = superior margin B = medial margin C = inferior margin 3. Received in saline is a 1.3 x 1.0 x 0.6 cm pigmented tissue which is sectioned to reveal a solid, pigmented cut surface. The trisected node is entirely submitted in one block. 4. Received in saline is a 0.7 x 0.6 x 0.5 cm ovoid pigmented tissue which is bisected and entirely submitted in one block. 5. Received in saline is a 0.7 x 0.4 x 0.2 cm pigmented tissue which is submitted in toto. (KF:ds 11/20/15) Stain(s) used in Diagnosis: The following stain(s) were used in diagnosing the case: Thyroid Transcription Factor -1, CK-7, CDX-2, Prostate Specific Antigen, CK 20. The control(s) stained appropriately. Disclaimer Some of these immunohistochemical stains may have been developed and the performance characteristics determined by [facility]  Disposition: 01-Home or Self Care     Medication List    STOP taking these medications        loperamide 2 MG capsule  Commonly known as:  IMODIUM      TAKE these medications        acetaminophen 500 MG tablet  Commonly known as:  TYLENOL  Take 1,000 mg by mouth every 6 (six) hours as needed for  moderate pain or headache.     allopurinol 300 MG tablet  Commonly known as:  ZYLOPRIM  TAKE ONE TABLET BY MOUTH ONCE DAILY IN THE MORNING     carvedilol 12.5 MG tablet  Commonly known as:  COREG  Take 12.5 mg by mouth 2 (two) times daily with a meal.      doxazosin 8 MG tablet  Commonly known as:  CARDURA  Take 1 tablet (8 mg total) by mouth at bedtime.     furosemide 80 MG tablet  Commonly known as:  LASIX  TAKE ONE TABLET BY MOUTH ONCE DAILY     gabapentin 300 MG capsule  Commonly known as:  NEURONTIN  Take 300-600 mg by mouth as directed. Take '300mg'$ s morning and '600mg'$ s at bedtime     KLOR-CON M20 20 MEQ tablet  Generic drug:  potassium chloride SA  Take 1 tablet (20 mEq total) by mouth once.     oxyCODONE 5 MG immediate release tablet  Commonly known as:  Oxy IR/ROXICODONE  Take 1 tablet (5 mg total) by mouth every 6 (six) hours as needed for severe pain.     polycarbophil 625 MG tablet  Commonly known as:  FIBERCON  Take 1,250 mg by mouth daily.     Vitamin D (Ergocalciferol) 50000 units Caps capsule  Commonly known as:  DRISDOL  Take 50,000 Units by mouth every 7 (seven) days.       Follow-up Information    Follow up with [name], MD.   Specialty:  Cardiothoracic Surgery   Why:  Appointment see the surgeon on 12/13/2015 at 4:15 PM. Please obtain a chest x-ray at [place] [place] at 3:45 PM. [facility] imaging is located in the same office complex as the Psychologist, sport and exercise.   Contact information:   [address] [place] [address] [phone]       Follow up with [facility].   Specialty:  Cardiothoracic Surgery   Why:  Appointment see the nurse on 11/30/2015 at 10 AM for suture removal   Contact information:   [place], [place] [phone]      Signed: John Giovanni 11/23/2015, 12:14 PM

## 2015-11-23 NOTE — Discharge Instructions (Signed)
Lung Resection, Care After Refer to this sheet in the next few weeks. These instructions provide you with information on caring for yourself after your procedure. Your health care provider may also give you more specific instructions. Your treatment has been planned according to current medical practices, but problems sometimes occur. Call your health care provider if you have any problems or questions after your procedure. WHAT TO EXPECT AFTER THE PROCEDURE After your procedure, it is typical to have the following:   You may feel pain in your chest and throat.  Patients may sometimes shiver or feel nauseous during recovery. HOME CARE INSTRUCTIONS  You may resume a normal diet and activities as directed by your health care provider.  Do not use any tobacco products, including cigarettes, chewing tobacco, or electronic cigarettes. If you need help quitting, ask your health care provider.  There are many different ways to close and cover an incision, including stitches, skin glue, and adhesive strips. Follow your health care provider's instructions on:  Incision care.  Bandage (dressing) changes and removal.  Incision closure removal.  Take medicines only as directed by your health care provider.  Keep all follow-up visits as directed by your health care provider. This is important.  Try to breathe deeply and cough as directed. Holding a pillow firmly over your ribs may help with discomfort.  If you were given an incentive spirometer in the hospital, continue to use it as directed by your health care provider.  Walk as directed by your health care provider.  You may take a shower and gently wash the area of your incision with water and soap as directed by your health care provider. Do not use anything else to clean your incision except as directed by your health care provider. Do not take baths, swim, or use a hot tub until your health care provider approves. SEEK MEDICAL CARE  IF:  You notice redness, swelling, or increasing pain at the incision site.  You are bleeding at the incision site.  You see pus coming from the incision site.  You notice a bad smell coming from the incision site or bandage.  Your incision breaks open.  You cough up blood or pus, or you develop a cough that produces bad-smelling sputum.  You have pain or swelling in your legs.  You have increasing pain that is not controlled with medicine.  You have trouble managing any of the tubes that have been left in place after surgery.  You have fever or chills. SEEK IMMEDIATE MEDICAL CARE IF:   You have chest pain or an irregular or rapid heartbeat.  You have dizzy episodes or faint.  You have shortness of breath or difficulty breathing.  You have persistent nausea or vomiting.  You have a rash.   This information is not intended to replace advice given to you by your health care provider. Make sure you discuss any questions you have with your health care provider.   Document Released: 01/10/2005 Document Revised: 07/14/2014 Document Reviewed: 08/12/2013 Elsevier Interactive Patient Education [employer].

## 2015-11-26 NOTE — Telephone Encounter (Signed)
Spoke with pt and gave results and recommendations. Pt agrees to start CPAP therapy. Order placed. Pt aware to call office and schedule f/u appt once starts CPAP. Nothing further needed.  

## 2015-11-30 ENCOUNTER — Encounter (INDEPENDENT_AMBULATORY_CARE_PROVIDER_SITE_OTHER): Payer: Self-pay

## 2015-11-30 DIAGNOSIS — Z9889 Other specified postprocedural states: Secondary | ICD-10-CM

## 2015-12-01 DIAGNOSIS — G4733 Obstructive sleep apnea (adult) (pediatric): Secondary | ICD-10-CM | POA: Diagnosis not present

## 2015-12-04 ENCOUNTER — Ambulatory Visit (HOSPITAL_BASED_OUTPATIENT_CLINIC_OR_DEPARTMENT_OTHER): Payer: Medicare Other | Admitting: Oncology

## 2015-12-04 VITALS — BP 146/73 | HR 74 | Temp 98.2°F | Resp 18 | Ht 68.0 in | Wt 229.8 lb

## 2015-12-04 DIAGNOSIS — I10 Essential (primary) hypertension: Secondary | ICD-10-CM

## 2015-12-04 DIAGNOSIS — R351 Nocturia: Secondary | ICD-10-CM | POA: Diagnosis not present

## 2015-12-04 DIAGNOSIS — C20 Malignant neoplasm of rectum: Secondary | ICD-10-CM | POA: Diagnosis not present

## 2015-12-04 DIAGNOSIS — D631 Anemia in chronic kidney disease: Secondary | ICD-10-CM

## 2015-12-04 DIAGNOSIS — C7802 Secondary malignant neoplasm of left lung: Secondary | ICD-10-CM | POA: Diagnosis not present

## 2015-12-04 DIAGNOSIS — R32 Unspecified urinary incontinence: Secondary | ICD-10-CM

## 2015-12-04 DIAGNOSIS — N189 Chronic kidney disease, unspecified: Secondary | ICD-10-CM

## 2015-12-04 NOTE — Progress Notes (Signed)
[facility] OFFICE PROGRESS NOTE   Diagnosis: Rectal cancer  INTERVAL HISTORY:   Jason [name] returns as scheduled. He underwent a bronchoscopy and left upper lung resection/node sampling by Dr. Servando Snare 11/20/2015. He reports an uneventful operative recovery.  The pathology ([id number]) confirmed metastatic adenocarcinoma of colorectal primary. The final resection margins were negative. Level 4, 5, and 10 lymph nodes were negative.  He reports intermittent urinary incontinence. He wears a brief. He has nocturia. He generally can sense when he needs to urinate or have a bowel movement.   Objective:  Vital signs in last 24 hours:  Blood pressure 146/73, pulse 74, temperature 98.2 F (36.8 C), temperature source Oral, resp. rate 18, height '5\' 8"'$  (1.727 m), weight 229 lb 12.8 oz (104.237 kg), SpO2 100 %.    HEENT: Neck without mass Lymphatics: No cervical, supraclavicular, axillary, or inguinal nodes Resp: Lungs clear bilaterally Cardio: Regular rate and rhythm GI: No hepatomegaly, no mass, nontender Vascular: No leg edema   Lab Results:  Lab Results  Component Value Date   WBC 9.3 11/23/2015   HGB 8.8* 11/23/2015   HCT 27.9* 11/23/2015   MCV 85.3 11/23/2015   PLT 209 11/23/2015   NEUTROABS 9.5* 08/22/2015    Lab Results  Component Value Date   CEA1 2.8 09/27/2015   Medications: I have reviewed the patient's current medications.  Assessment/Plan: 1. Rectal cancer, clinical stage III (uT3, uN2).  Initiation of concurrent Xeloda and radiation on 02/21/2013, completed 03/31/2013.   CEA normal 04/25/2013   Status post laparoscopic low anterior resection with diverting loop ileostomy 05/23/2013. Final pathology showed a 3.2 cm invasive adenocarcinoma with neoadjuvant related change. Tumor invaded into perirectal soft tissue. There was no lymphovascular or perineural invasion. 14 lymph nodes were negative for tumor. Surgical margins were negative. There  was one hyperplastic polyp (ypT3, pN0).   Initiation of weekly 5-FU/leucovorin 08/01/2013   Ileostomy takedown 08/24/2013.   Weekly 5-FU/leucovorin resumed 09/22/2013.  Restaging CTs 02/20/2014 negative for evidence of recurrent disease  restaging CT scans 04/05/2015 negative for evidence of recurrent disease  Surveillance colonoscopy 03/31/2014, status post removal of of a tubular adenoma from the ascending colon  Enlarging left posterior medial upper lobe nodule on chest CT 04/05/2015 and 07/12/2015  PET scan 10/22/2015 with a hypermetabolic nodule in the medial laxative upper lobe, other lung nodules stable  Left lung wedge resection 11/20/2015 confirmed a 1 cm focus of metastatic adenocarcinoma of colorectal origin, resection margins and multiple lymph nodes negative  2. Markedly elevated pretreatment CEA 3. Indeterminate pulmonary/subpleural nodules and right hepatic dome lesion. Unchanged on the CT 04/25/2013, unchanged nodular lung lesions on the CT 02/20/2014, see follow-up CTs/PET above 4. Renal insufficiency. Acute on chronic renal failure during hospitalization November/December 2014. Improved following the ileostomy takedown. 5. History of Anemia secondary chemotherapy, chronic disease, and renal insufficiency 6. Hypertension. 7. Gout. 8. Prolonged postoperative ileus following laparoscopic low anterior resection with diverting loop ileostomy 05/23/2013. 9. C. difficile-positive 09/08/2013-status post Flagyl with improvement. Recurrent diarrhea following completion of Flagyl. Stool positive for C. difficile on 09/24/2013. He completed treatment with vancomycin. The diarrhea resolved. 10. Left greater than right low leg edema. Venous Doppler 09/26/2013 negative for left leg DVT. 11. Rectal urgency/incontinence-Improved with participation in the pelvic physical therapy clinic.    Disposition:  Jason [name] underwent the left lung resection on 11/20/2015. He has recovered  from surgery. I discussed the pathology findings with Jason [name] and his wife. The pathology is consistent with metastatic  rectal cancer. There is no other clinical or radiologic evidence of metastatic disease. He was treated with chemotherapy in the adjuvant setting. There is no clear benefit from "adjuvant" chemotherapy following lung resection. He will be followed with observation.  He will return for an office visit, CEA, and restaging CT scans in September.  Betsy Coder, MD  12/04/2015  4:07 PM

## 2015-12-05 ENCOUNTER — Encounter: Payer: Self-pay | Admitting: *Deleted

## 2015-12-11 ENCOUNTER — Encounter: Payer: Medicare Other | Admitting: Cardiothoracic Surgery

## 2015-12-12 ENCOUNTER — Institutional Professional Consult (permissible substitution): Payer: Medicare Other | Admitting: Pulmonary Disease

## 2015-12-13 ENCOUNTER — Encounter: Payer: Medicare Other | Admitting: Cardiothoracic Surgery

## 2015-12-17 DIAGNOSIS — M65872 Other synovitis and tenosynovitis, left ankle and foot: Secondary | ICD-10-CM | POA: Diagnosis not present

## 2015-12-17 DIAGNOSIS — M722 Plantar fascial fibromatosis: Secondary | ICD-10-CM | POA: Diagnosis not present

## 2015-12-18 ENCOUNTER — Ambulatory Visit (INDEPENDENT_AMBULATORY_CARE_PROVIDER_SITE_OTHER): Payer: Medicare Other | Admitting: Internal Medicine

## 2015-12-18 ENCOUNTER — Encounter: Payer: Self-pay | Admitting: Internal Medicine

## 2015-12-18 VITALS — BP 150/90 | HR 79 | Temp 97.9°F | Resp 16 | Ht 68.0 in | Wt 226.0 lb

## 2015-12-18 DIAGNOSIS — J988 Other specified respiratory disorders: Secondary | ICD-10-CM

## 2015-12-18 DIAGNOSIS — R05 Cough: Secondary | ICD-10-CM

## 2015-12-18 DIAGNOSIS — J22 Unspecified acute lower respiratory infection: Secondary | ICD-10-CM

## 2015-12-18 DIAGNOSIS — R059 Cough, unspecified: Secondary | ICD-10-CM

## 2015-12-18 MED ORDER — AMOXICILLIN-POT CLAVULANATE 875-125 MG PO TABS: 1.0000 | ORAL_TABLET | Freq: Two times a day (BID) | ORAL | Status: AC

## 2015-12-18 MED ORDER — HYDROCODONE-HOMATROPINE 5-1.5 MG/5ML PO SYRP: 5.0000 mL | ORAL_SOLUTION | Freq: Three times a day (TID) | ORAL | Status: DC | PRN

## 2015-12-18 NOTE — Patient Instructions (Signed)

## 2015-12-18 NOTE — Progress Notes (Signed)
Subjective:  Patient ID: Jason Cotta Sr., male    DOB: 20-Feb-1946  Age: 70 y.o. MRN: [id number]  CC: Cough   HPI Jason BOLLMAN Sr. presents for a 10 day history of cough that is productive of thick, yellow/green phlegm. He reports night sweats, low-grade fever, and chills. He denies shortness of breath, chest pain, hemoptysis, wheezing, or syncope.  Outpatient Prescriptions Prior to Visit  Medication Sig Dispense Refill  . acetaminophen (TYLENOL) 500 MG tablet Take 1,000 mg by mouth every 6 (six) hours as needed for moderate pain or headache.    . allopurinol (ZYLOPRIM) 300 MG tablet TAKE ONE TABLET BY MOUTH ONCE DAILY IN THE MORNING 90 tablet 2  . doxazosin (CARDURA) 8 MG tablet Take 1 tablet (8 mg total) by mouth at bedtime. 90 tablet 3  . furosemide (LASIX) 80 MG tablet TAKE ONE TABLET BY MOUTH ONCE DAILY 90 tablet 0  . gabapentin (NEURONTIN) 300 MG capsule Take 300-600 mg by mouth as directed. Take '300mg'$ s morning and '600mg'$ s at bedtime    . KLOR-CON M20 20 MEQ tablet Take 1 tablet (20 mEq total) by mouth once. (Patient taking differently: Take 20 mEq by mouth 2 (two) times daily. ) 90 tablet 1  . oxyCODONE (OXY IR/ROXICODONE) 5 MG immediate release tablet Take 1 tablet (5 mg total) by mouth every 6 (six) hours as needed for severe pain. 40 tablet 0  . polycarbophil (FIBERCON) 625 MG tablet Take 1,250 mg by mouth daily.    . Vitamin D, Ergocalciferol, (DRISDOL) 50000 UNITS CAPS capsule Take 50,000 Units by mouth every 7 (seven) days.     No facility-administered medications prior to visit.    ROS Review of Systems  Constitutional: Positive for fever and chills. Negative for diaphoresis, activity change, appetite change, fatigue and unexpected weight change.  HENT: Negative.  Negative for facial swelling, sore throat and trouble swallowing.   Eyes: Negative.   Respiratory: Positive for cough. Negative for apnea, choking, chest tightness, shortness of breath, wheezing and stridor.     Cardiovascular: Negative.  Negative for chest pain, palpitations and leg swelling.  Gastrointestinal: Negative.  Negative for nausea, vomiting, abdominal pain and diarrhea.  Endocrine: Negative.   Genitourinary: Negative.   Musculoskeletal: Negative.  Negative for myalgias, back pain, arthralgias and neck pain.  Skin: Negative.  Negative for color change and rash.  Allergic/Immunologic: Negative.   Neurological: Negative.  Negative for dizziness, tremors, weakness, light-headedness and headaches.  Hematological: Negative.  Negative for adenopathy. Does not bruise/bleed easily.    Objective:  BP 150/90 mmHg  Pulse 79  Temp(Src) 97.9 F (36.6 C) (Oral)  Resp 16  Ht '5\' 8"'$  (1.727 m)  Wt 226 lb (102.513 kg)  BMI 34.37 kg/m2  SpO2 98%  BP Readings from Last 3 Encounters:  12/18/15 150/90  12/04/15 146/73  11/23/15 130/75    Wt Readings from Last 3 Encounters:  12/18/15 226 lb (102.513 kg)  12/04/15 229 lb 12.8 oz (104.237 kg)  11/21/15 236 lb 15.9 oz (107.5 kg)    Physical Exam  Constitutional: He is oriented to person, place, and time.  Non-toxic appearance. He does not have a sickly appearance. He does not appear ill. No distress.  HENT:  Mouth/Throat: Oropharynx is clear and moist. No oropharyngeal exudate.  Eyes: Conjunctivae are normal. Right eye exhibits no discharge. Left eye exhibits no discharge. No scleral icterus.  Neck: Normal range of motion. Neck supple. No JVD present. No tracheal deviation present. No thyromegaly present.  Cardiovascular: Normal rate, regular rhythm, normal heart sounds and intact distal pulses.  Exam reveals no gallop and no friction rub.   No murmur heard. Pulmonary/Chest: Effort normal and breath sounds normal. No stridor. No respiratory distress. He has no wheezes. He has no rales. He exhibits no tenderness.  Abdominal: Soft. Bowel sounds are normal. He exhibits no distension and no mass. There is no tenderness. There is no rebound and no  guarding.  Musculoskeletal: Normal range of motion. He exhibits no edema or tenderness.  Lymphadenopathy:    He has no cervical adenopathy.  Neurological: He is oriented to person, place, and time.  Skin: Skin is warm and dry. No rash noted. He is not diaphoretic. No erythema. No pallor.  Vitals reviewed.   Lab Results  Component Value Date   WBC 9.3 11/23/2015   HGB 8.8* 11/23/2015   HCT 27.9* 11/23/2015   PLT 209 11/23/2015   GLUCOSE 91 11/23/2015   CHOL 208* 06/04/2015   TRIG 203.0* 06/04/2015   HDL 34.80* 06/04/2015   LDLDIRECT 120.0 06/04/2015   LDLCALC 131* 05/29/2010   ALT 12* 11/22/2015   AST 21 11/22/2015   NA 137 11/23/2015   K 4.6 11/23/2015   CL 102 11/23/2015   CREATININE 3.11* 11/23/2015   BUN 42* 11/23/2015   CO2 26 11/23/2015   TSH 1.90 08/22/2015   PSA 1.86 08/10/2013   INR 1.11 11/16/2015   HGBA1C 6.0 12/20/2009    Dg Chest 2 View  11/16/2015  CLINICAL DATA:  Preoperative examination prior to video-assisted thoracoscopy and lung resection. EXAM: CHEST  2 VIEW COMPARISON:  CT scan of the chest of July 12, 2015 and PET-CT study of October 22, 2015. FINDINGS: The lungs are well-expanded. There is no focal infiltrate. Known pulmonary nodules are not well demonstrated. Subtle nodularity projects over the posterior aspect of the right seventh rib. There is no pleural effusion. The heart is normal in size. The pulmonary vascularity is not engorged. There is calcification of the anterior longitudinal ligament of the thoracic spine. IMPRESSION: No plain radiographic evidence of active cardiopulmonary disease. Subtle nodularity in the right mid lung. The patient has known pulmonary parenchymal nodules. Electronically Signed   By: David  Martinique M.D.   On: 11/16/2015 16:35    Dg Chest 2 View  11/23/2015  CLINICAL DATA:  70 year old male status post removal of left-sided chest tube EXAM: CHEST  2 VIEW COMPARISON:  Prior chest x-ray 11/22/2015 FINDINGS: Interval removal of  left-sided chest tube. There is a tiny left apical pneumothorax. Stable cardiac and mediastinal contours. Right IJ approach central venous catheter remains in similar position with the tip overlying the distal SVC. Chronic background pulmonary parenchymal changes and left upper lobe pulmonary nodule are similar compared to prior. No acute osseous abnormality. IMPRESSION: 1. Tiny left apical pneumothorax following chest tube removal. 2. Stable position of right IJ central venous catheter. 3. Similar appearance of the lungs with chronic pulmonary parenchymal changes and left upper lobe pulmonary nodule. Electronically Signed   By: Jacqulynn Cadet M.D.   On: 11/23/2015 07:54   Dg Chest Port 1 View  11/22/2015  CLINICAL DATA:  Status post wedge resection of the left lung. EXAM: PORTABLE CHEST 1 VIEW COMPARISON:  Portable chest x-ray of Nov 21, 2015. FINDINGS: The right lung is adequately inflated and clear. On the left the lung is well-expanded. There is no pneumothorax or pleural effusion. Parenchymal density in the left parahilar region superiorly remains. The left-sided chest tube is in  stable position. The right internal jugular venous catheter tip projects over the midportion of the SVC. IMPRESSION: Improved appearance of the left lung. Persistent left perihilar atelectasis superiorly. No pneumothorax. The support tubes are in stable position. Electronically Signed   By: David  Martinique M.D.   On: 11/22/2015 07:36   Dg Chest Port 1 View  11/21/2015  CLINICAL DATA:  Status post video-assisted thoracoscopy with left lung partial lobectomy. EXAM: PORTABLE CHEST 1 VIEW COMPARISON:  Portable chest x-ray of Nov 20, 2015 FINDINGS: The right lung is adequately inflated and clear. On the left there is decreased perihilar density. There is no pleural effusion or pneumothorax. The chest tube is in stable position. The heart is normal in size. The pulmonary vascularity is not engorged. The right internal jugular venous  catheter tip projects over the midportion of the SVC. IMPRESSION: Improved appearance of both lungs. Decreased pulmonary interstitial edema and bilateral subsegmental atelectasis. No pneumothorax The remaining support tubes are in reasonable position. Electronically Signed   By: David  Martinique M.D.   On: 11/21/2015 08:01   Dg Chest Port 1 View  11/20/2015  CLINICAL DATA:  Z09 (ICD-10-CM) - S/P lung surgery, follow-up examPost op chestS/P lung surgery, follow-up exam 176160 EXAM: PORTABLE CHEST 1 VIEW COMPARISON:  11/16/2015 FINDINGS: Since the prior exam, left-sided thoracic surgery has been performed. There is a chest tube that resides along the external margin of the thoracic cage projecting over the lower left scapula. Another chest tube has its tip near the medial left apex. There is no convincing pneumothorax. There is some airspace type opacity extending laterally and superiorly from the left hilum, likely atelectasis there is also mild atelectasis at the lung bases and adjacent to the minor fissure on the right. No pulmonary edema. Cardiac silhouette is normal in size and configuration. No mediastinal or hilar masses. Right internal jugular central venous line has its tip in the lower superior vena cava. IMPRESSION: 1. Status post left thoracic/lung surgery. 2. Mild left perihilar atelectasis with mild basilar atelectasis bilaterally. No pulmonary edema. 3. No pneumothorax. 4. There are 2 chest tubes, 1 with its tip near the left apex and the other projecting outside of thoracic cavity. Electronically Signed   By: Lajean Manes M.D.   On: 11/20/2015 12:51    Assessment & Plan:   Jason [name] was seen today for cough.  Diagnoses and all orders for this visit:  Cough- I have ordered a chest x-ray to see if he has pneumonia -     HYDROcodone-homatropine (HYCODAN) 5-1.5 MG/5ML syrup; Take 5 mLs by mouth every 8 (eight) hours as needed for cough. -     DG Chest 2 View; Future  LRTI (lower respiratory tract  infection)- will empirically treat for bacterial causes of LRTI with Augmentin, he will try Hycodan as needed for the cough. -     amoxicillin-clavulanate (AUGMENTIN) 875-125 MG tablet; Take 1 tablet by mouth 2 (two) times daily. -     HYDROcodone-homatropine (HYCODAN) 5-1.5 MG/5ML syrup; Take 5 mLs by mouth every 8 (eight) hours as needed for cough.  I am having Jason [name] start on amoxicillin-clavulanate and HYDROcodone-homatropine. I am also having him maintain his polycarbophil, Vitamin D (Ergocalciferol), gabapentin, furosemide, doxazosin, KLOR-CON M20, allopurinol, acetaminophen, and oxyCODONE.  Meds ordered this encounter  Medications  . amoxicillin-clavulanate (AUGMENTIN) 875-125 MG tablet    Sig: Take 1 tablet by mouth 2 (two) times daily.    Dispense:  20 tablet    Refill:  0  .  HYDROcodone-homatropine (HYCODAN) 5-1.5 MG/5ML syrup    Sig: Take 5 mLs by mouth every 8 (eight) hours as needed for cough.    Dispense:  240 mL    Refill:  0     Follow-up: Return in about 3 weeks (around 01/08/2016).  [name], MD

## 2015-12-18 NOTE — Progress Notes (Signed)
Pre visit review using our clinic review tool, if applicable. No additional management support is needed unless otherwise documented below in the visit note. 

## 2015-12-19 ENCOUNTER — Other Ambulatory Visit: Payer: Self-pay | Admitting: Cardiothoracic Surgery

## 2015-12-19 ENCOUNTER — Ambulatory Visit (INDEPENDENT_AMBULATORY_CARE_PROVIDER_SITE_OTHER)
Admission: RE | Admit: 2015-12-19 | Discharge: 2015-12-19 | Disposition: A | Discharge status: Home / Self Care | Payer: Medicare Other | Source: Ambulatory Visit | Attending: Internal Medicine | Admitting: Internal Medicine

## 2015-12-19 DIAGNOSIS — R059 Cough, unspecified: Secondary | ICD-10-CM

## 2015-12-19 DIAGNOSIS — Z902 Acquired absence of lung [part of]: Secondary | ICD-10-CM

## 2015-12-19 DIAGNOSIS — R05 Cough: Secondary | ICD-10-CM

## 2015-12-20 ENCOUNTER — Ambulatory Visit (INDEPENDENT_AMBULATORY_CARE_PROVIDER_SITE_OTHER): Payer: Self-pay | Admitting: Cardiothoracic Surgery

## 2015-12-20 ENCOUNTER — Encounter: Payer: Self-pay | Admitting: Cardiothoracic Surgery

## 2015-12-20 DIAGNOSIS — Z902 Acquired absence of lung [part of]: Secondary | ICD-10-CM

## 2015-12-20 DIAGNOSIS — C3411 Malignant neoplasm of upper lobe, right bronchus or lung: Secondary | ICD-10-CM

## 2015-12-20 DIAGNOSIS — G4733 Obstructive sleep apnea (adult) (pediatric): Secondary | ICD-10-CM | POA: Diagnosis not present

## 2015-12-20 NOTE — Progress Notes (Signed)
Jason [name]       [place] [address]             [phone]      Jason [name] Sr. [facility] Medical Record #[id number] Date of Birth: 1945-12-15  Referring: [name], MD Primary Care: [name], MD  Chief Complaint:   POST OP FOLLOW UP  Rectal cancer [facility])   Staging form: Colon and Rectum, AJCC 7th Edition     Clinical stage from 11/23/2015: Stage IVA (T3, N2, M1a) - Signed by [name], MD on 11/23/2015  11/20/2015  OPERATIVE REPORT PREOPERATIVE DIAGNOSIS: Left upper lobe lung mass. POSTOPERATIVE DIAGNOSIS: Left upper lobe lung mass. Adenocarcinoma of colonic origin by frozen section. PROCEDURE PERFORMED: Bronchoscopy, left video-assisted thoracoscopy, mini thoracotomy, left upper lobe lung resection with node sampling, and placement of On-Q device. SURGEON: [name], MD.  History of Present Illness:     Patient returns after recent wedge resection of metastatic colon cancer from the left upper lobe. Overall he is making good progress grass postoperatively. He did note that he had some increased cough and sputum production earlier this week was seen by primary care and started on cough medicine and by mouth antibiotics for "pneumonia". Symptoms have significantly improved he denies any fever or chills.     Past Medical History  Diagnosis Date  . Gout   . Hypertension   . Hyperplastic colon polyp   . Diverticulosis   . Anemia   . Arthritis   . ED (erectile dysfunction)   . History of radiation therapy 02/21/13-03/31/13    rectum 50.4Gy total dose  . rectal ca dx'd 02/01/13    rectal. Radiation and chemotherapy- remains on chemotherapy-next tx. 08-22-13.   . Sleep apnea     no cpap yet  . COPD (chronic obstructive pulmonary disease) ([facility])     pt denies  . Chronic renal insufficiency, stage II (mild)   . S/P thoracotomy      History  Smoking status  . Former Smoker -- 0.50 packs/day for 10 years  . Types:  Cigarettes  . Quit date: 07/07/1977  Smokeless tobacco  . Never Used    History  Alcohol Use No    Comment: pint of whisky every 2 weeks and occasional beer until about a month ago-Quit 10'14      Allergies  Allergen Reactions  . Amlodipine Other (See Comments)    Edema   . Nsaids     Asked by surgeon to add this medication class as intolerance due to patients renal insufficiency.    Current Outpatient Prescriptions  Medication Sig Dispense Refill  . acetaminophen (TYLENOL) 500 MG tablet Take 1,000 mg by mouth every 6 (six) hours as needed for moderate pain or headache.    . allopurinol (ZYLOPRIM) 300 MG tablet TAKE ONE TABLET BY MOUTH ONCE DAILY IN THE MORNING 90 tablet 2  . amoxicillin-clavulanate (AUGMENTIN) 875-125 MG tablet Take 1 tablet by mouth 2 (two) times daily. 20 tablet 0  . doxazosin (CARDURA) 8 MG tablet Take 1 tablet (8 mg total) by mouth at bedtime. 90 tablet 3  . furosemide (LASIX) 80 MG tablet TAKE ONE TABLET BY MOUTH ONCE DAILY 90 tablet 0  . gabapentin (NEURONTIN) 300 MG capsule Take 300-600 mg by mouth as directed. Take '300mg'$ s morning and '600mg'$ s at bedtime    . HYDROcodone-homatropine (HYCODAN) 5-1.5 MG/5ML syrup Take 5 mLs by mouth every 8 (eight) hours as needed for  cough. 240 mL 0  . KLOR-CON M20 20 MEQ tablet Take 1 tablet (20 mEq total) by mouth once. (Patient taking differently: Take 20 mEq by mouth 2 (two) times daily. ) 90 tablet 1  . oxyCODONE (OXY IR/ROXICODONE) 5 MG immediate release tablet Take 1 tablet (5 mg total) by mouth every 6 (six) hours as needed for severe pain. 40 tablet 0  . polycarbophil (FIBERCON) 625 MG tablet Take 1,250 mg by mouth daily.    . Vitamin D, Ergocalciferol, (DRISDOL) 50000 UNITS CAPS capsule Take 50,000 Units by mouth every 7 (seven) days.     No current facility-administered medications for this visit.       Physical Exam: BP 141/76 mmHg  Pulse 81  Resp 16  Ht '5\' 8"'$  (1.727 m)  Wt 226 lb (102.513 kg)  BMI 34.37  kg/m2  SpO2 98%  General appearance: alert, cooperative and no distress Neurologic: intact Heart: regular rate and rhythm, S1, S2 normal, no murmur, click, rub or gallop Lungs: clear to auscultation bilaterally Abdomen: soft, non-tender; bowel sounds normal; no masses,  no organomegaly Extremities: extremities normal, atraumatic, no cyanosis or edema Wound: Wounds are well healed on the left chest   Diagnostic Studies & Laboratory data:     Recent Radiology Findings:   Dg Chest 2 View  12/19/2015  CLINICAL DATA:  History of pneumonia, cough, former smoking history, history of resection of left upper lobe adenocarcinoma, also history of rectal carcinoma EXAM: CHEST  2 VIEW COMPARISON:  Chest x-ray of 11/23/2015, PET-CT 10/22/2015 and chest CT 07/12/2015 FINDINGS: Pleural sutures and linear opacities in the medial left upper lobe are at the site of resection of prior left upper lobe lung nodule consistent with postoperative change and scarring. No active infiltrate or effusion is seen. Mild nodularity anteriorly involving the of right hemidiaphragm is unchanged compared to the prior CT. Mediastinal and hilar contours are unremarkable. The heart is within upper limits of normal. There are degenerative changes diffusely throughout the thoracic spine. IMPRESSION: Stable postoperative change in the left upper hemi thorax. No active process. Electronically Signed   By: [name] M.D.   On: 12/19/2015 09:13      Recent Lab Findings: Lab Results  Component Value Date   WBC 9.3 11/23/2015   HGB 8.8* 11/23/2015   HCT 27.9* 11/23/2015   PLT 209 11/23/2015   GLUCOSE 91 11/23/2015   CHOL 208* 06/04/2015   TRIG 203.0* 06/04/2015   HDL 34.80* 06/04/2015   LDLDIRECT 120.0 06/04/2015   LDLCALC 131* 05/29/2010   ALT 12* 11/22/2015   AST 21 11/22/2015   NA 137 11/23/2015   K 4.6 11/23/2015   CL 102 11/23/2015   CREATININE 3.11* 11/23/2015   BUN 42* 11/23/2015   CO2 26 11/23/2015   TSH 1.90  08/22/2015   INR 1.11 11/16/2015   HGBA1C 6.0 12/20/2009      Assessment / Plan:     Stable after recent wedge resection of the left upper lobe for metastatic colon cancer. Patient has follow-up with Dr. Benay Spice. He is to have a follow-up CT scan in mid September. There is no evidence of pneumonic process on his chest x-ray I have encouraged and can complete his Augmentin prescription for probably what is bronchitis. Plan to see him back in September after the follow-up CT scan.   [name] MD      [name] [place] [address] Office [phone]   Beeper [phone]  12/20/2015 12:39 PM

## 2015-12-27 DIAGNOSIS — M25562 Pain in left knee: Secondary | ICD-10-CM | POA: Diagnosis not present

## 2016-01-01 DIAGNOSIS — G4733 Obstructive sleep apnea (adult) (pediatric): Secondary | ICD-10-CM | POA: Diagnosis not present

## 2016-01-07 DIAGNOSIS — M12272 Villonodular synovitis (pigmented), left ankle and foot: Secondary | ICD-10-CM | POA: Diagnosis not present

## 2016-01-07 DIAGNOSIS — M65871 Other synovitis and tenosynovitis, right ankle and foot: Secondary | ICD-10-CM | POA: Diagnosis not present

## 2016-01-07 DIAGNOSIS — M65872 Other synovitis and tenosynovitis, left ankle and foot: Secondary | ICD-10-CM | POA: Diagnosis not present

## 2016-01-07 DIAGNOSIS — M12271 Villonodular synovitis (pigmented), right ankle and foot: Secondary | ICD-10-CM | POA: Diagnosis not present

## 2016-01-07 DIAGNOSIS — M792 Neuralgia and neuritis, unspecified: Secondary | ICD-10-CM | POA: Diagnosis not present

## 2016-01-09 ENCOUNTER — Ambulatory Visit (INDEPENDENT_AMBULATORY_CARE_PROVIDER_SITE_OTHER): Payer: Medicare Other | Admitting: Internal Medicine

## 2016-01-09 ENCOUNTER — Other Ambulatory Visit (INDEPENDENT_AMBULATORY_CARE_PROVIDER_SITE_OTHER): Payer: Medicare Other

## 2016-01-09 ENCOUNTER — Encounter: Payer: Self-pay | Admitting: Internal Medicine

## 2016-01-09 VITALS — BP 162/108 | HR 67 | Temp 98.5°F | Resp 16 | Wt 222.0 lb

## 2016-01-09 DIAGNOSIS — D510 Vitamin B12 deficiency anemia due to intrinsic factor deficiency: Secondary | ICD-10-CM | POA: Diagnosis not present

## 2016-01-09 DIAGNOSIS — N3281 Overactive bladder: Secondary | ICD-10-CM

## 2016-01-09 DIAGNOSIS — N184 Chronic kidney disease, stage 4 (severe): Secondary | ICD-10-CM

## 2016-01-09 DIAGNOSIS — N393 Stress incontinence (female) (male): Secondary | ICD-10-CM | POA: Insufficient documentation

## 2016-01-09 DIAGNOSIS — E669 Obesity, unspecified: Secondary | ICD-10-CM

## 2016-01-09 DIAGNOSIS — E66811 Obesity, class 1: Secondary | ICD-10-CM

## 2016-01-09 DIAGNOSIS — I10 Essential (primary) hypertension: Secondary | ICD-10-CM

## 2016-01-09 HISTORY — DX: Stress incontinence (female) (male): N39.3

## 2016-01-09 HISTORY — DX: Overactive bladder: N32.81

## 2016-01-09 LAB — URINALYSIS, ROUTINE W REFLEX MICROSCOPIC
Bilirubin Urine: NEGATIVE
KETONES UR: NEGATIVE
LEUKOCYTES UA: NEGATIVE
Nitrite: NEGATIVE
PH: 5.5 (ref 5.0–8.0)
SPECIFIC GRAVITY, URINE: 1.01 (ref 1.000–1.030)
TOTAL PROTEIN, URINE-UPE24: 30 — AB
URINE GLUCOSE: NEGATIVE
UROBILINOGEN UA: 0.2 (ref 0.0–1.0)

## 2016-01-09 LAB — CBC WITH DIFFERENTIAL/PLATELET
BASOS ABS: 0 10*3/uL (ref 0.0–0.1)
Basophils Relative: 0 % (ref 0.0–3.0)
EOS ABS: 0 10*3/uL (ref 0.0–0.7)
Eosinophils Relative: 0 % (ref 0.0–5.0)
HEMATOCRIT: 36.1 % — AB (ref 39.0–52.0)
Hemoglobin: 11.5 g/dL — ABNORMAL LOW (ref 13.0–17.0)
Lymphs Abs: 0.9 10*3/uL (ref 0.7–4.0)
MCHC: 31.8 g/dL (ref 30.0–36.0)
MCV: 85.2 fl (ref 78.0–100.0)
MONO ABS: 0.9 10*3/uL (ref 0.1–1.0)
Monocytes Relative: 5.4 % (ref 3.0–12.0)
NEUTROS ABS: 15.7 10*3/uL — AB (ref 1.4–7.7)
PLATELETS: 281 10*3/uL (ref 150.0–400.0)
RBC: 4.24 Mil/uL (ref 4.22–5.81)
RDW: 17.8 % — ABNORMAL HIGH (ref 11.5–15.5)
WBC: 17.5 10*3/uL — ABNORMAL HIGH (ref 4.0–10.5)

## 2016-01-09 MED ORDER — MIRABEGRON ER 50 MG PO TB24: 50.0000 mg | ORAL_TABLET | Freq: Every day | ORAL | Status: DC

## 2016-01-09 MED ORDER — CHLORTHALIDONE 25 MG PO TABS: 25.0000 mg | ORAL_TABLET | Freq: Every day | ORAL | Status: DC

## 2016-01-09 MED ORDER — NEBIVOLOL HCL 5 MG PO TABS: 5.0000 mg | ORAL_TABLET | Freq: Every day | ORAL | Status: DC

## 2016-01-09 NOTE — Progress Notes (Signed)
Subjective:  Patient ID: Jason Cotta Sr., male    DOB: [birth year]  Age: 70 y.o. MRN: [id number]  CC: Hypertension   HPI Jason BERNARDINI Sr. presents for a BP check. His cough has resolved. He complains of urinary frequency and occasional urinary incontinence with cough and sneeze. His BP has not been well controlled at home.  Outpatient Prescriptions Prior to Visit  Medication Sig Dispense Refill  . acetaminophen (TYLENOL) 500 MG tablet Take 1,000 mg by mouth every 6 (six) hours as needed for moderate pain or headache.    . allopurinol (ZYLOPRIM) 300 MG tablet TAKE ONE TABLET BY MOUTH ONCE DAILY IN THE MORNING 90 tablet 2  . doxazosin (CARDURA) 8 MG tablet Take 1 tablet (8 mg total) by mouth at bedtime. 90 tablet 3  . gabapentin (NEURONTIN) 300 MG capsule Take 300-600 mg by mouth as directed. Take '300mg'$ s morning and '600mg'$ s at bedtime    . KLOR-CON M20 20 MEQ tablet Take 1 tablet (20 mEq total) by mouth once. (Patient taking differently: Take 20 mEq by mouth 2 (two) times daily. ) 90 tablet 1  . Vitamin D, Ergocalciferol, (DRISDOL) 50000 UNITS CAPS capsule Take 50,000 Units by mouth every 7 (seven) days.    . furosemide (LASIX) 80 MG tablet TAKE ONE TABLET BY MOUTH ONCE DAILY 90 tablet 0  . HYDROcodone-homatropine (HYCODAN) 5-1.5 MG/5ML syrup Take 5 mLs by mouth every 8 (eight) hours as needed for cough. 240 mL 0  . oxyCODONE (OXY IR/ROXICODONE) 5 MG immediate release tablet Take 1 tablet (5 mg total) by mouth every 6 (six) hours as needed for severe pain. 40 tablet 0  . polycarbophil (FIBERCON) 625 MG tablet Take 1,250 mg by mouth daily.     No facility-administered medications prior to visit.    ROS Review of Systems  Constitutional: Negative.  Negative for fatigue.  HENT: Negative.   Eyes: Negative.  Negative for visual disturbance.  Respiratory: Negative for cough, choking, chest tightness, shortness of breath and stridor.   Cardiovascular: Negative.  Negative for chest pain,  palpitations and leg swelling.  Gastrointestinal: Negative.  Negative for nausea, vomiting, abdominal pain, diarrhea and constipation.  Endocrine: Negative.   Genitourinary: Positive for frequency. Negative for dysuria, urgency, hematuria, decreased urine volume, enuresis, difficulty urinating and testicular pain.  Musculoskeletal: Negative.  Negative for myalgias, back pain, joint swelling, arthralgias and neck pain.  Skin: Negative.  Negative for color change.  Neurological: Negative.  Negative for dizziness, tremors, weakness, numbness and headaches.  Hematological: Negative for adenopathy. Does not bruise/bleed easily.  Psychiatric/Behavioral: Negative.     Objective:  BP 162/108 mmHg  Pulse 67  Temp(Src) 98.5 F (36.9 C) (Oral)  Resp 16  Wt 222 lb (100.699 kg)  SpO2 97%  BP Readings from Last 3 Encounters:  01/09/16 162/108  12/20/15 141/76  12/18/15 150/90    Wt Readings from Last 3 Encounters:  01/09/16 222 lb (100.699 kg)  12/20/15 226 lb (102.513 kg)  12/18/15 226 lb (102.513 kg)    Physical Exam  Constitutional: He is oriented to person, place, and time. No distress.  HENT:  Mouth/Throat: Oropharynx is clear and moist. No oropharyngeal exudate.  Eyes: Conjunctivae are normal. Right eye exhibits no discharge. Left eye exhibits no discharge. No scleral icterus.  Neck: Normal range of motion. Neck supple. No JVD present. No tracheal deviation present. No thyromegaly present.  Cardiovascular: Normal rate, regular rhythm, normal heart sounds and intact distal pulses.  Exam reveals no gallop  and no friction rub.   No murmur heard. Pulmonary/Chest: Effort normal and breath sounds normal. No stridor. No respiratory distress. He has no wheezes. He has no rales. He exhibits no tenderness.  Abdominal: Soft. Bowel sounds are normal. He exhibits no distension and no mass. There is no tenderness. There is no rebound and no guarding.  Musculoskeletal: Normal range of motion. He  exhibits no edema or tenderness.  Lymphadenopathy:    He has no cervical adenopathy.  Neurological: He is oriented to person, place, and time.  Skin: Skin is warm and dry. No rash noted. He is not diaphoretic. No erythema. No pallor.  Vitals reviewed.   Lab Results  Component Value Date   WBC 9.3 11/23/2015   HGB 8.8* 11/23/2015   HCT 27.9* 11/23/2015   PLT 209 11/23/2015   GLUCOSE 91 11/23/2015   CHOL 208* 06/04/2015   TRIG 203.0* 06/04/2015   HDL 34.80* 06/04/2015   LDLDIRECT 120.0 06/04/2015   LDLCALC 131* 05/29/2010   ALT 12* 11/22/2015   AST 21 11/22/2015   NA 137 11/23/2015   K 4.6 11/23/2015   CL 102 11/23/2015   CREATININE 3.11* 11/23/2015   BUN 42* 11/23/2015   CO2 26 11/23/2015   TSH 1.90 08/22/2015   PSA 1.86 08/10/2013   INR 1.11 11/16/2015   HGBA1C 6.0 12/20/2009    Dg Chest 2 View  12/19/2015  CLINICAL DATA:  History of pneumonia, cough, former smoking history, history of resection of left upper lobe adenocarcinoma, also history of rectal carcinoma EXAM: CHEST  2 VIEW COMPARISON:  Chest x-ray of 11/23/2015, PET-CT 10/22/2015 and chest CT 07/12/2015 FINDINGS: Pleural sutures and linear opacities in the medial left upper lobe are at the site of resection of prior left upper lobe lung nodule consistent with postoperative change and scarring. No active infiltrate or effusion is seen. Mild nodularity anteriorly involving the of right hemidiaphragm is unchanged compared to the prior CT. Mediastinal and hilar contours are unremarkable. The heart is within upper limits of normal. There are degenerative changes diffusely throughout the thoracic spine. IMPRESSION: Stable postoperative change in the left upper hemi thorax. No active process. Electronically Signed   By: [name] M.D.   On: 12/19/2015 09:13    Assessment & Plan:   Jason [name] was seen today for hypertension.  Diagnoses and all orders for this visit:  Obesity (BMI 30.0-34.9)- He agrees to decrease his caloric  intake in an effort to lose weight  Essential hypertension- his blood pressure is not adequately well controlled on a loop diuretic and a peripheral alpha blocker. I've asked him to change to a more potent thiazide diuretic and will also add nebivolol. Will monitor his labs today to screen for secondary causes and end organ damage. -     Basic metabolic panel; Future -     nebivolol (BYSTOLIC) 5 MG tablet; Take 1 tablet (5 mg total) by mouth daily. -     chlorthalidone (HYGROTON) 25 MG tablet; Take 1 tablet (25 mg total) by mouth daily.  Chronic renal insufficiency, stage 4 (severe) (HCC)- he agrees to avoid nephrotoxic agents, will seek to get better blood pressure control, will continue to monitor his renal function today. -     Basic metabolic panel; Future -     CBC with Differential/Platelet; Future -     Urinalysis, Routine w reflex microscopic (not at [facility]); Future  Pernicious anemia- I will recheck his H/H today, his recent vitamin levels were all normal, this appears  to be the anemia of chronic disease combined with renal insufficiency -     CBC with Differential/Platelet; Future  OAB (overactive bladder) -     mirabegron ER (MYRBETRIQ) 50 MG TB24 tablet; Take 1 tablet (50 mg total) by mouth daily.  SUI (stress urinary incontinence), male -     mirabegron ER (MYRBETRIQ) 50 MG TB24 tablet; Take 1 tablet (50 mg total) by mouth daily.   I have discontinued Mr. Bourcier polycarbophil, furosemide, oxyCODONE, HYDROcodone-homatropine, meloxicam, and methylPREDNISolone. I am also having him start on nebivolol, chlorthalidone, and mirabegron ER. Additionally, I am having him maintain his Vitamin D (Ergocalciferol), gabapentin, doxazosin, KLOR-CON M20, allopurinol, and acetaminophen.  Meds ordered this encounter  Medications  . DISCONTD: meloxicam (MOBIC) 7.5 MG tablet    Sig: Take 1 tablet by mouth. Reported on 01/09/2016  . DISCONTD: methylPREDNISolone (MEDROL DOSEPAK) 4 MG TBPK tablet     Sig: as directed.  . nebivolol (BYSTOLIC) 5 MG tablet    Sig: Take 1 tablet (5 mg total) by mouth daily.    Dispense:  70 tablet    Refill:  0  . chlorthalidone (HYGROTON) 25 MG tablet    Sig: Take 1 tablet (25 mg total) by mouth daily.    Dispense:  30 tablet    Refill:  5  . mirabegron ER (MYRBETRIQ) 50 MG TB24 tablet    Sig: Take 1 tablet (50 mg total) by mouth daily.    Dispense:  30 tablet    Refill:  11     Follow-up: Return in about 6 weeks (around 02/20/2016).  [name], MD

## 2016-01-09 NOTE — Progress Notes (Signed)
Pre visit review using our clinic review tool, if applicable. No additional management support is needed unless otherwise documented below in the visit note. 

## 2016-01-09 NOTE — Patient Instructions (Signed)
Hypertension Hypertension, commonly called high blood pressure, is when the force of blood pumping through your arteries is too strong. Your arteries are the blood vessels that carry blood from your heart throughout your body. A blood pressure reading consists of a higher number over a lower number, such as 110/72. The higher number (systolic) is the pressure inside your arteries when your heart pumps. The lower number (diastolic) is the pressure inside your arteries when your heart relaxes. Ideally you want your blood pressure below 120/80. Hypertension forces your heart to work harder to pump blood. Your arteries may become narrow or stiff. Having untreated or uncontrolled hypertension can cause heart attack, stroke, kidney disease, and other problems. RISK FACTORS Some risk factors for high blood pressure are controllable. Others are not.  Risk factors you cannot control include:   Race. You may be at higher risk if you are African American.  Age. Risk increases with age.  Gender. Men are at higher risk than women before age 45 years. After age 65, women are at higher risk than men. Risk factors you can control include:  Not getting enough exercise or physical activity.  Being overweight.  Getting too much fat, sugar, calories, or salt in your diet.  Drinking too much alcohol. SIGNS AND SYMPTOMS Hypertension does not usually cause signs or symptoms. Extremely high blood pressure (hypertensive crisis) may cause headache, anxiety, shortness of breath, and nosebleed. DIAGNOSIS To check if you have hypertension, your health care provider will measure your blood pressure while you are seated, with your arm held at the level of your heart. It should be measured at least twice using the same arm. Certain conditions can cause a difference in blood pressure between your right and left arms. A blood pressure reading that is higher than normal on one occasion does not mean that you need treatment. If  it is not clear whether you have high blood pressure, you may be asked to return on a different day to have your blood pressure checked again. Or, you may be asked to monitor your blood pressure at home for 1 or more weeks. TREATMENT Treating high blood pressure includes making lifestyle changes and possibly taking medicine. Living a healthy lifestyle can help lower high blood pressure. You may need to change some of your habits. Lifestyle changes may include:  Following the DASH diet. This diet is high in fruits, vegetables, and whole grains. It is low in salt, red meat, and added sugars.  Keep your sodium intake below 2,300 mg per day.  Getting at least 30-45 minutes of aerobic exercise at least 4 times per week.  Losing weight if necessary.  Not smoking.  Limiting alcoholic beverages.  Learning ways to reduce stress. Your health care provider may prescribe medicine if lifestyle changes are not enough to get your blood pressure under control, and if one of the following is true:  You are 18-59 years of age and your systolic blood pressure is above 140.  You are 60 years of age or older, and your systolic blood pressure is above 150.  Your diastolic blood pressure is above 90.  You have diabetes, and your systolic blood pressure is over 140 or your diastolic blood pressure is over 90.  You have kidney disease and your blood pressure is above 140/90.  You have heart disease and your blood pressure is above 140/90. Your personal target blood pressure may vary depending on your medical conditions, your age, and other factors. HOME CARE INSTRUCTIONS    Have your blood pressure rechecked as directed by your health care provider.   Take medicines only as directed by your health care provider. Follow the directions carefully. Blood pressure medicines must be taken as prescribed. The medicine does not work as well when you skip doses. Skipping doses also puts you at risk for  problems.  Do not smoke.   Monitor your blood pressure at home as directed by your health care provider. SEEK MEDICAL CARE IF:   You think you are having a reaction to medicines taken.  You have recurrent headaches or feel dizzy.  You have swelling in your ankles.  You have trouble with your vision. SEEK IMMEDIATE MEDICAL CARE IF:  You develop a severe headache or confusion.  You have unusual weakness, numbness, or feel faint.  You have severe chest or abdominal pain.  You vomit repeatedly.  You have trouble breathing. MAKE SURE YOU:   Understand these instructions.  Will watch your condition.  Will get help right away if you are not doing well or get worse.   This information is not intended to replace advice given to you by your health care provider. Make sure you discuss any questions you have with your health care provider.   Document Released: 06/23/2005 Document Revised: 11/07/2014 Document Reviewed: 04/15/2013 Elsevier Interactive Patient Education 2016 [employer]  

## 2016-01-10 ENCOUNTER — Encounter: Payer: Self-pay | Admitting: Internal Medicine

## 2016-01-10 ENCOUNTER — Telehealth: Payer: Self-pay | Admitting: Pulmonary Disease

## 2016-01-10 LAB — BASIC METABOLIC PANEL
BUN: 60 mg/dL — AB (ref 6–23)
CALCIUM: 10.7 mg/dL — AB (ref 8.4–10.5)
CO2: 29 meq/L (ref 19–32)
CREATININE: 3.04 mg/dL — AB (ref 0.40–1.50)
Chloride: 97 mEq/L (ref 96–112)
GFR: 26.4 mL/min — ABNORMAL LOW (ref >60.00)
Glucose, Bld: 103 mg/dL — ABNORMAL HIGH (ref 70–99)
Potassium: 4.5 mEq/L (ref 3.5–5.1)
Sodium: 135 mEq/L (ref 135–145)

## 2016-01-10 NOTE — Telephone Encounter (Signed)
  pls tell pt the autocpap DL showed AHI at 16 with 100% compliance. His OSA is not corrected.   Pls tell pt he needs a cpap/bipap titration study to determine best pressure. pls let me know what he says. Tnx.  AD

## 2016-01-15 ENCOUNTER — Encounter: Payer: Self-pay | Admitting: Pulmonary Disease

## 2016-01-15 ENCOUNTER — Ambulatory Visit (INDEPENDENT_AMBULATORY_CARE_PROVIDER_SITE_OTHER): Payer: Medicare Other | Admitting: Pulmonary Disease

## 2016-01-15 VITALS — BP 132/78 | HR 78 | Ht 68.0 in | Wt 223.0 lb

## 2016-01-15 DIAGNOSIS — Z9989 Dependence on other enabling machines and devices: Principal | ICD-10-CM

## 2016-01-15 DIAGNOSIS — C20 Malignant neoplasm of rectum: Secondary | ICD-10-CM

## 2016-01-15 DIAGNOSIS — E669 Obesity, unspecified: Secondary | ICD-10-CM | POA: Insufficient documentation

## 2016-01-15 DIAGNOSIS — G4733 Obstructive sleep apnea (adult) (pediatric): Secondary | ICD-10-CM | POA: Diagnosis not present

## 2016-01-15 NOTE — Telephone Encounter (Signed)
Spoke with pt. States that he has an appointment today with AD. Pt wants to discuss this with AD when he comes in.

## 2016-01-15 NOTE — Assessment & Plan Note (Addendum)
Has hypersomnia, snoring, fatigue, difficult to control blood pressure, witnessed apneas, frequent awakenings.  He sleeps around 5-6 hours per night. Naps almost daily. Hypersomnia affects functionality. HST 11/2015  AHI 54.  Autocpap 5-15.  DL x 1 month initially AHI 16, 100% complaince. Rpt DL with AHI 12, 100%. AHI related to leak. Has leak issues.   Plan :  We extensively discussed the importance of treating OSA and the need to use PAP therapy.   Continue with autocpap 5-15 cm water for now. Needs a mask fitting session to address mask leak issues.  Will also need to use humidifier and see if it helps.  DL for 1 month.    Patient was instructed to have mask, tubings, filter, reservoir cleaned at least once a week with soapy water.  Patient was instructed to call the office if he/she is having issues with the PAP device.    I advised patient to obtain sufficient amount of sleep --  7 to 8 hours at least in a 24 hr period.  Patient was advised to follow good sleep hygiene.  Patient was advised NOT to engage in activities requiring concentration and/or vigilance if he/she is and  sleepy.  Patient is NOT to drive if he/she is sleepy.

## 2016-01-15 NOTE — Assessment & Plan Note (Signed)
Weight reduction 

## 2016-01-15 NOTE — Assessment & Plan Note (Signed)
Patient had colon cancer in 2015. Had surgery, chemotherapy, radiation. Follows up with oncology. Recent chest CT scan which showed bigger left upper lung, medial nodule. PET scan ordered recently showed hypermetabolic left upper lung nodule. Nodule is subpleural. Difficult to access via bronchoscopy.  Pt had a LUL nodule resection which showed rectal adeno CA. Currently follows with Oncology

## 2016-01-15 NOTE — Patient Instructions (Signed)
  It was a pleasure taking care of you today!  Continue using your CPAP machine. We will try to get you a mask fitting session. We will ask your DME Company to teach her how to use your humidifier.  Please make sure you use your CPAP device everytime you sleep.  We will monitor the usage of your machine per your insurance requirement.  Your insurance company may take the machine from you if you are not using it regularly.   Please clean the mask, tubings, filter, water reservoir with soapy water every week.  Please use distilled water for the water reservoir.   Please call the office or your machine provider (DME company) if you are having issues with the device.   Return to clinic in 6 months

## 2016-01-15 NOTE — Progress Notes (Signed)
Subjective:    Patient ID: Jason Cotta Sr., male    DOB: 01/08/1946, 70 y.o.   MRN: [id number]  HPI   This is the case of Jason BRESS Sr., 70 y.o. Male, who was referred by Dr. [name] in consultation regarding possible OSA.   As you very well know, patient has a 15 PY smoking history, quit 15 yrs ago.  Not beed dxed with asthma or copd.  Had colon CA in 2014. Had surgery, chemo, radiation. Is in remission.  Recent chest ct scan with enalrged LUL medial nodule. PET scan shows hypermetabolic LUL nodule. He is scheduled to see his Oncologist this Friday.  Anyway, pt is here for possible OSA.  Has hypersomnia, snoring, fatigue, difficult to control blood pressure, witnessed apneas, frequent awakenings.  He sleeps around 5-6 hours per night. Naps almost daily. Hypersomnia affects functionality. It has been difficult to control his blood pressure.   [name]  01/15/16 Patient returns to the office as follow-up on his sleep apnea. Since last seen, he had a home sleep study which showed AHI of 54. He is tolerating CPAP well. Feels better using it. More energy. Less sleepiness. Initial download showed compliance of 100% with AHI of 16. Recent download showed the same compliance but AHI better 12. He has mask leak issues. Does not use humidifier.      Review of Systems  Constitutional: Negative.  Negative for fever and unexpected weight change.       Gained 5 lbs x 1 month  HENT: Negative.  Negative for congestion, dental problem, ear pain, nosebleeds, postnasal drip, rhinorrhea, sinus pressure, sneezing, sore throat and trouble swallowing.   Eyes: Negative.  Negative for redness and itching.  Respiratory: Positive for shortness of breath. Negative for cough, chest tightness and wheezing.   Cardiovascular: Negative.  Negative for palpitations and leg swelling.  Gastrointestinal: Negative.  Negative for nausea and vomiting.  Endocrine: Negative.   Genitourinary: Negative.  Negative for  dysuria.  Musculoskeletal: Negative.  Negative for joint swelling.  Skin: Negative.  Negative for rash.  Allergic/Immunologic: Negative.   Neurological: Negative.  Negative for headaches.  Hematological: Negative.  Does not bruise/bleed easily.  Psychiatric/Behavioral: Negative.  Negative for dysphoric mood. The patient is not nervous/anxious.   All other systems reviewed and are negative.      Objective:   Physical Exam  Vitals:  Filed Vitals:   01/15/16 1601  BP: 132/78  Pulse: 78  Height: '5\' 8"'$  (1.727 m)  Weight: 223 lb (101.152 kg)  SpO2: 98%    Constitutional/General:  Pleasant, well-nourished, well-developed, not in any distress,  Comfortably seating.  Well kempt morbidly obese.   Body mass index is 33.91 kg/(m^2). Wt Readings from Last 3 Encounters:  01/15/16 223 lb (101.152 kg)  01/09/16 222 lb (100.699 kg)  12/20/15 226 lb (102.513 kg)    Neck circumference: 17 in  HEENT: Pupils equal and reactive to light and accommodation. Anicteric sclerae. Normal nasal mucosa.   No oral  lesions,  mouth clear,  oropharynx clear, no postnasal drip. (-) Oral thrush. No dental caries.  Airway - Mallampati class IV  Neck: No masses. Midline trachea. No JVD, (-) LAD. (-) bruits appreciated.  Respiratory/Chest: Grossly normal chest. (-) deformity. (-) Accessory muscle use.  Symmetric expansion. (-) Tenderness on palpation.  Resonant on percussion.  Diminished BS on both lower lung zones. (-) wheezing, crackles, rhonchi (-) egophony  Cardiovascular: Regular rate and  rhythm, heart sounds normal, no murmur  or gallops, no peripheral edema  Gastrointestinal:  Normal bowel sounds. Soft, non-tender. No hepatosplenomegaly.  (-) masses.   Musculoskeletal:  Normal muscle tone. Normal gait.   Extremities: Grossly normal. (-) clubbing, cyanosis.  (-) edema  Skin: (-) rash,lesions seen.   Neurological/Psychiatric : alert, oriented to time, place, person. Normal mood and  affect            Assessment & Plan:  Obstructive sleep apnea Has hypersomnia, snoring, fatigue, difficult to control blood pressure, witnessed apneas, frequent awakenings.  He sleeps around 5-6 hours per night. Naps almost daily. Hypersomnia affects functionality. HST 11/2015  AHI 54.  Autocpap 5-15.  DL x 1 month initially AHI 16, 100% complaince. Rpt DL with AHI 12, 100%. AHI related to leak. Has leak issues.   Plan :  We extensively discussed the importance of treating OSA and the need to use PAP therapy.   Continue with autocpap 5-15 cm water for now. Needs a mask fitting session to address mask leak issues.  Will also need to use humidifier and see if it helps.  DL for 1 month.    Patient was instructed to have mask, tubings, filter, reservoir cleaned at least once a week with soapy water.  Patient was instructed to call the office if he/she is having issues with the PAP device.    I advised patient to obtain sufficient amount of sleep --  7 to 8 hours at least in a 24 hr period.  Patient was advised to follow good sleep hygiene.  Patient was advised NOT to engage in activities requiring concentration and/or vigilance if he/she is and  sleepy.  Patient is NOT to drive if he/she is sleepy.   Obesity (BMI 30.0-34.9) Weight reduction.   Rectal cancer Patient had colon cancer in 2015. Had surgery, chemotherapy, radiation. Follows up with oncology. Recent chest CT scan which showed bigger left upper lung, medial nodule. PET scan ordered recently showed hypermetabolic left upper lung nodule. Nodule is subpleural. Difficult to access via bronchoscopy.  Pt had a LUL nodule resection which showed rectal adeno CA. Currently follows with Oncology    Patient will follow up with me in 6 mos.     Monica Becton, MD 01/15/2016   4:55 PM [facility] [place] Pager: [phone] Office: [phone], Fax: [phone]

## 2016-01-17 ENCOUNTER — Encounter: Payer: Self-pay | Admitting: Pulmonary Disease

## 2016-01-24 DIAGNOSIS — M65872 Other synovitis and tenosynovitis, left ankle and foot: Secondary | ICD-10-CM | POA: Diagnosis not present

## 2016-01-24 DIAGNOSIS — M65871 Other synovitis and tenosynovitis, right ankle and foot: Secondary | ICD-10-CM | POA: Diagnosis not present

## 2016-01-26 ENCOUNTER — Ambulatory Visit (INDEPENDENT_AMBULATORY_CARE_PROVIDER_SITE_OTHER): Payer: Medicare Other | Admitting: Family Medicine

## 2016-01-26 ENCOUNTER — Encounter: Payer: Self-pay | Admitting: Family Medicine

## 2016-01-26 VITALS — BP 140/82 | HR 90 | Temp 98.4°F | Resp 20 | Wt 223.0 lb

## 2016-01-26 DIAGNOSIS — I10 Essential (primary) hypertension: Secondary | ICD-10-CM

## 2016-01-26 DIAGNOSIS — R600 Localized edema: Secondary | ICD-10-CM | POA: Diagnosis not present

## 2016-01-26 DIAGNOSIS — E669 Obesity, unspecified: Secondary | ICD-10-CM | POA: Diagnosis not present

## 2016-01-26 NOTE — Progress Notes (Signed)
Pre visit review using our clinic review tool, if applicable. No additional management support is needed unless otherwise documented below in the visit note. 

## 2016-01-26 NOTE — Patient Instructions (Signed)
-  elevate legs for 30 minutes twice daily  -wear compression socks every day, remove before bed  -eat a healthy diet and avoid processed foods and high sodium foods as we discussed    We recommend the following healthy lifestyle: 1) Small portions - eat off of salad plate instead of dinner plate 2) Eat a healthy clean diet with avoidance of (less then 1 serving per week) processed foods, sweetened drinks, white starches, red meat, fast foods and sweets and consisting of: * 5-9 servings per day of fresh or frozen fruits and vegetables (not corn or potatoes, not dried or canned) *nuts and seeds, beans *olives and olive oil *small portions of lean meats such as fish and white chicken  *small portions of whole grains 3)Get at least 150 minutes of sweaty aerobic exercise per week - if you are not doing this now - gradually work up to this

## 2016-01-26 NOTE — Progress Notes (Signed)
HPI:  Jason SHEFFLER Sr. is a pleasant 70 yo here for an acute Saturday clinic visit for LE edema. Reports this is a chronic issue for him, L always > R, and that his prior PCP Dr. Sherren Mocha had him taking chlorthalidone and lasix. Reports his new PCP recently stopped his lasix as it was making him urinate too much. Since stopping the lasix his LE edema has worsened. Reports has been wore then this in the past. Denies leg pain, leg redness or warmth, CP, SOB, palpitation, DOE. He does eat foods with high sodium - processed meats.  ROS: See pertinent positives and negatives per HPI.  Past Medical History  Diagnosis Date  . Gout   . Hypertension   . Hyperplastic colon polyp   . Diverticulosis   . Anemia   . Arthritis   . ED (erectile dysfunction)   . History of radiation therapy 02/21/13-03/31/13    rectum 50.4Gy total dose  . rectal ca dx'd 02/01/13    rectal. Radiation and chemotherapy- remains on chemotherapy-next tx. 08-22-13.   . Sleep apnea     no cpap yet  . COPD (chronic obstructive pulmonary disease) ([place])     pt denies  . Chronic renal insufficiency, stage II (mild)   . S/P thoracotomy     Past Surgical History  Procedure Laterality Date  . Colonoscopy  2010    [facility]  . Eus N/A 02/03/2013    Procedure: LOWER ENDOSCOPIC ULTRASOUND (EUS);  Surgeon: Milus Banister, MD;  Location: [facility];  Service: Endoscopy;  Laterality: N/A;  . Laparoscopic low anterior resection N/A 05/20/2013    Procedure: LAPAROSCOPIC LOW ANTERIOR RESECTION WITH SPLENIC FLEXURE MOBILIZATION;  Surgeon: Leighton Ruff, MD;  Location: [facility];  Service: General;  Laterality: N/A;  . Ostomy N/A 05/20/2013    Procedure: diverting OSTOMY;  Surgeon: Leighton Ruff, MD;  Location: [facility];  Service: General;  Laterality: N/A;  . Colon surgery  05/20/2013  . Ileostomy closure N/A 08/24/2013    Procedure: CLOSURE OF LOOP ILEOSTOMY ;  Surgeon: Leighton Ruff, MD;  Location: [facility];  Service: General;  Laterality:  N/A;  . Video bronchoscopy N/A 11/20/2015    Procedure: VIDEO BRONCHOSCOPY;  Surgeon: [name], MD;  Location: [facility];  Service: Thoracic;  Laterality: N/A;  . Video assisted thoracoscopy (vats)/wedge resection Left 11/20/2015    Procedure: VIDEO ASSISTED THORACOSCOPY (VATS)/LUNG RESECTION;  Surgeon: [name], MD;  Location: [facility];  Service: Thoracic;  Laterality: Left;    Family History  Problem Relation Age of Onset  . Hypertension Other   . Stroke Mother   . Hypertension Mother   . Stroke Father   . Hypertension Father   . Colon cancer Neg Hx     Social History   Social History  . Marital Status: Married    Spouse Name: Jason [name]  . Number of Children: 2  . Years of Education: 12   Occupational History  .      retired Insurance account manager   Social History Main Topics  . Smoking status: Former Smoker -- 0.50 packs/day for 10 years    Types: Cigarettes    Quit date: 07/07/1977  . Smokeless tobacco: Never Used  . Alcohol Use: No     Comment: pint of whisky every 2 weeks and occasional beer until about a month ago-Quit 10'14   . Drug Use: No  . Sexual Activity: Not Asked   Other Topics Concern  . None  Social History Narrative   Married to wife, Jason [name]   Retired Insurance account manager w/[employer]   #2 grown children and #6 grandchildren     Current outpatient prescriptions:  .  acetaminophen (TYLENOL) 500 MG tablet, Take 1,000 mg by mouth every 6 (six) hours as needed for moderate pain or headache., Disp: , Rfl:  .  allopurinol (ZYLOPRIM) 300 MG tablet, TAKE ONE TABLET BY MOUTH ONCE DAILY IN THE MORNING, Disp: 90 tablet, Rfl: 2 .  chlorthalidone (HYGROTON) 25 MG tablet, Take 1 tablet (25 mg total) by mouth daily., Disp: 30 tablet, Rfl: 5 .  doxazosin (CARDURA) 8 MG tablet, Take 1 tablet (8 mg total) by mouth at bedtime., Disp: 90 tablet, Rfl: 3 .  gabapentin (NEURONTIN) 300 MG capsule, Take 300-600 mg by mouth as directed. Take '300mg'$ s  morning and '600mg'$ s at bedtime, Disp: , Rfl:  .  KLOR-CON M20 20 MEQ tablet, Take 1 tablet (20 mEq total) by mouth once. (Patient taking differently: Take 20 mEq by mouth 2 (two) times daily. ), Disp: 90 tablet, Rfl: 1 .  mirabegron ER (MYRBETRIQ) 50 MG TB24 tablet, Take 1 tablet (50 mg total) by mouth daily., Disp: 30 tablet, Rfl: 11 .  nebivolol (BYSTOLIC) 5 MG tablet, Take 1 tablet (5 mg total) by mouth daily., Disp: 70 tablet, Rfl: 0 .  Vitamin D, Ergocalciferol, (DRISDOL) 50000 UNITS CAPS capsule, Take 50,000 Units by mouth every 7 (seven) days., Disp: , Rfl:   EXAM:  Filed Vitals:   01/26/16 0954  BP: 140/82  Pulse: 90  Temp: 98.4 F (36.9 C)  Resp: 20    Body mass index is 33.91 kg/(m^2).  GENERAL: vitals reviewed and listed above, alert, oriented, appears well hydrated and in no acute distress  HEENT: atraumatic, conjunttiva clear, no obvious abnormalities on inspection of external nose and ears  NECK: no obvious masses on inspection  LUNGS: clear to auscultation bilaterally, no wheezes, rales or rhonchi, good air movement  CV: HRRR, bilat LE foot/ankle and lower leg edema to mid calf L mildly > R  MS: moves all extremities without noticeable abnormality  PSYCH: pleasant and cooperative, no obvious depression or anxiety  ASSESSMENT AND PLAN:  Discussed the following assessment and plan:  Bilateral edema of lower extremity  -trial elevation, compression (repors he has compression socks but does not wear them - willing to), healthy diet -follow up with PCP as scheduled or sooner as needed -Patient advised to return or notify a doctor immediately if symptoms worsen or persist or new concerns arise.  Patient Instructions  -elevate legs for 30 minutes twice daily  -wear compression socks every day, remove before bed  -eat a healthy diet and avoid processed foods and high sodium foods as we discussed    We recommend the following healthy lifestyle: 1) Small  portions - eat off of salad plate instead of dinner plate 2) Eat a healthy clean diet with avoidance of (less then 1 serving per week) processed foods, sweetened drinks, white starches, red meat, fast foods and sweets and consisting of: * 5-9 servings per day of fresh or frozen fruits and vegetables (not corn or potatoes, not dried or canned) *nuts and seeds, beans *olives and olive oil *small portions of lean meats such as fish and white chicken  *small portions of whole grains 3)Get at least 150 minutes of sweaty aerobic exercise per week - if you are not doing this now - gradually work up to this  Colin Benton R., DO

## 2016-01-31 DIAGNOSIS — G4733 Obstructive sleep apnea (adult) (pediatric): Secondary | ICD-10-CM | POA: Diagnosis not present

## 2016-02-06 ENCOUNTER — Other Ambulatory Visit: Payer: Self-pay | Admitting: Internal Medicine

## 2016-02-06 DIAGNOSIS — N2581 Secondary hyperparathyroidism of renal origin: Secondary | ICD-10-CM | POA: Diagnosis not present

## 2016-02-06 DIAGNOSIS — N184 Chronic kidney disease, stage 4 (severe): Secondary | ICD-10-CM | POA: Diagnosis not present

## 2016-02-06 DIAGNOSIS — I10 Essential (primary) hypertension: Secondary | ICD-10-CM

## 2016-02-06 DIAGNOSIS — N189 Chronic kidney disease, unspecified: Secondary | ICD-10-CM | POA: Diagnosis not present

## 2016-02-06 LAB — BASIC METABOLIC PANEL: CREATININE: 4.1 mg/dL — AB (ref 0.6–1.3)

## 2016-02-11 DIAGNOSIS — N2581 Secondary hyperparathyroidism of renal origin: Secondary | ICD-10-CM | POA: Diagnosis not present

## 2016-02-11 DIAGNOSIS — D631 Anemia in chronic kidney disease: Secondary | ICD-10-CM | POA: Diagnosis not present

## 2016-02-11 DIAGNOSIS — N184 Chronic kidney disease, stage 4 (severe): Secondary | ICD-10-CM | POA: Diagnosis not present

## 2016-02-11 DIAGNOSIS — I10 Essential (primary) hypertension: Secondary | ICD-10-CM | POA: Diagnosis not present

## 2016-02-14 ENCOUNTER — Other Ambulatory Visit: Payer: Self-pay | Admitting: Vascular Surgery

## 2016-02-14 DIAGNOSIS — N184 Chronic kidney disease, stage 4 (severe): Secondary | ICD-10-CM

## 2016-02-14 DIAGNOSIS — Z01810 Encounter for preprocedural cardiovascular examination: Secondary | ICD-10-CM

## 2016-02-18 ENCOUNTER — Other Ambulatory Visit (HOSPITAL_COMMUNITY): Payer: Self-pay | Admitting: *Deleted

## 2016-02-19 ENCOUNTER — Ambulatory Visit (HOSPITAL_COMMUNITY)
Admission: RE | Admit: 2016-02-19 | Discharge: 2016-02-19 | Disposition: A | Discharge status: Home / Self Care | Payer: Medicare Other | Source: Ambulatory Visit | Attending: Nephrology | Admitting: Nephrology

## 2016-02-19 DIAGNOSIS — D509 Iron deficiency anemia, unspecified: Secondary | ICD-10-CM | POA: Diagnosis not present

## 2016-02-19 MED ORDER — SODIUM CHLORIDE 0.9 % IV SOLN
510.0000 mg | INTRAVENOUS | Status: DC
  Administered 2016-02-19: 510 mg via INTRAVENOUS
  Filled 2016-02-19: qty 17

## 2016-02-20 ENCOUNTER — Ambulatory Visit (INDEPENDENT_AMBULATORY_CARE_PROVIDER_SITE_OTHER): Payer: Medicare Other | Admitting: Internal Medicine

## 2016-02-20 ENCOUNTER — Encounter: Payer: Self-pay | Admitting: Internal Medicine

## 2016-02-20 VITALS — BP 132/82 | HR 73 | Temp 99.1°F | Resp 16 | Ht 68.0 in | Wt 223.1 lb

## 2016-02-20 DIAGNOSIS — E669 Obesity, unspecified: Secondary | ICD-10-CM

## 2016-02-20 DIAGNOSIS — I10 Essential (primary) hypertension: Secondary | ICD-10-CM

## 2016-02-20 NOTE — Progress Notes (Signed)
Subjective:  Patient ID: Jason [name]., male    DOB: 1946/03/27  Age: 70 y.o. MRN: [id number]  CC: Hypertension   HPI Jason [name]. presents for follow-up on hypertension, worsening renal function, and a recent evaluation for lower extremity edema. He was seen elsewhere about a month ago and complained of lower extremity edema at which time a thiazide diuretic was started. He has since seen his nephrologist and has been told that his renal function is declining and it sounds like he is going to have a vascular graft placed and possibly starting hemodialysis. He feels better today and tells me that the edema in his lower extremity has imrpoved. He has had no recent episodes of headache/blurred vision/chest pain/shortness of breath.  Outpatient Medications Prior to Visit  Medication Sig Dispense Refill  . acetaminophen (TYLENOL) 500 MG tablet Take 1,000 mg by mouth every 6 (six) hours as needed for moderate pain or headache.    . allopurinol (ZYLOPRIM) 300 MG tablet TAKE ONE TABLET BY MOUTH ONCE DAILY IN THE MORNING 90 tablet 2  . chlorthalidone (HYGROTON) 25 MG tablet Take 1 tablet (25 mg total) by mouth daily. 30 tablet 5  . doxazosin (CARDURA) 8 MG tablet Take 1 tablet (8 mg total) by mouth at bedtime. 90 tablet 3  . gabapentin (NEURONTIN) 300 MG capsule Take 300-600 mg by mouth as directed. Take '300mg'$ s morning and '600mg'$ s at bedtime    . KLOR-CON M20 20 MEQ tablet TAKE ONE TABLET BY MOUTH ONCE DAILY 90 tablet 3  . nebivolol (BYSTOLIC) 5 MG tablet Take 1 tablet (5 mg total) by mouth daily. 70 tablet 0  . Vitamin D, Ergocalciferol, (DRISDOL) 50000 UNITS CAPS capsule Take 50,000 Units by mouth every 7 (seven) days.    . mirabegron ER (MYRBETRIQ) 50 MG TB24 tablet Take 1 tablet (50 mg total) by mouth daily. 30 tablet 11   No facility-administered medications prior to visit.     ROS Review of Systems  Constitutional: Negative.  Negative for appetite change, diaphoresis, fatigue and  unexpected weight change.  HENT: Negative.   Eyes: Negative.  Negative for visual disturbance.  Respiratory: Negative for cough, choking, chest tightness, shortness of breath, wheezing and stridor.   Cardiovascular: Positive for leg swelling. Negative for chest pain and palpitations.  Gastrointestinal: Negative.  Negative for abdominal pain, blood in stool, constipation, diarrhea, nausea and vomiting.  Endocrine: Negative.   Genitourinary: Negative.  Negative for difficulty urinating.  Musculoskeletal: Negative for arthralgias, joint swelling and myalgias.  Skin: Negative.  Negative for color change, pallor and rash.  Allergic/Immunologic: Negative.   Neurological: Negative.  Negative for dizziness, tremors, weakness, light-headedness, numbness and headaches.  Hematological: Negative.  Negative for adenopathy. Does not bruise/bleed easily.  Psychiatric/Behavioral: Negative.     Objective:  BP 132/82 (BP Location: Left Arm, Patient Position: Sitting, Cuff Size: Normal)   Pulse 73   Temp 99.1 F (37.3 C) (Oral)   Ht '5\' 8"'$  (1.727 m)   Wt 223 lb 2 oz (101.2 kg)   SpO2 97%   BMI 33.93 kg/m   BP Readings from Last 3 Encounters:  02/20/16 132/82  02/19/16 119/66  01/26/16 140/82    Wt Readings from Last 3 Encounters:  02/20/16 223 lb 2 oz (101.2 kg)  02/19/16 220 lb (99.8 kg)  01/26/16 223 lb (101.2 kg)    Physical Exam  Constitutional: He is oriented to person, place, and time. No distress.  HENT:  Mouth/Throat: Oropharynx is clear  and moist. No oropharyngeal exudate.  Eyes: Conjunctivae are normal. Right eye exhibits no discharge. Left eye exhibits no discharge. No scleral icterus.  Neck: Normal range of motion. Neck supple. No JVD present. No tracheal deviation present. No thyromegaly present.  Cardiovascular: Normal rate, regular rhythm, normal heart sounds and intact distal pulses.  Exam reveals no gallop and no friction rub.   No murmur heard. Pulmonary/Chest: Effort  normal and breath sounds normal. No stridor. No respiratory distress. He has no wheezes. He has no rales. He exhibits no tenderness.  Abdominal: Soft. Bowel sounds are normal. He exhibits no distension and no mass. There is no tenderness. There is no rebound and no guarding.  Musculoskeletal: Normal range of motion. He exhibits edema (trace pitting edema in BLE). He exhibits no tenderness or deformity.  Lymphadenopathy:    He has no cervical adenopathy.  Neurological: He is oriented to person, place, and time.  Skin: Skin is warm and dry. No rash noted. He is not diaphoretic. No erythema. No pallor.  Vitals reviewed.   Lab Results  Component Value Date   WBC 17.5 (H) 01/09/2016   HGB 11.5 (L) 01/09/2016   HCT 36.1 (L) 01/09/2016   PLT 281.0 01/09/2016   GLUCOSE 103 (H) 01/09/2016   CHOL 208 (H) 06/04/2015   TRIG 203.0 (H) 06/04/2015   HDL 34.80 (L) 06/04/2015   LDLDIRECT 120.0 06/04/2015   LDLCALC 131 (H) 05/29/2010   ALT 12 (L) 11/22/2015   AST 21 11/22/2015   NA 135 01/09/2016   K 4.5 01/09/2016   CL 97 01/09/2016   CREATININE 3.04 (H) 01/09/2016   BUN 60 (H) 01/09/2016   CO2 29 01/09/2016   TSH 1.90 08/22/2015   PSA 1.86 08/10/2013   INR 1.11 11/16/2015   HGBA1C 6.0 12/20/2009    No results found.  Assessment & Plan:   Jason [name] was seen today for hypertension.  Diagnoses and all orders for this visit:  Obesity (BMI 30.0-34.9)- he agrees to reduce his caloric intake to help him lose weight  Essential hypertension- his blood pressure is adequately well controlled on the current combination of nebivolol and chlorthalidone, will continue to follow with him as he starts to be evaluated for hemodialysis.   I have discontinued Jason [name] mirabegron ER. I am also having him maintain his Vitamin D (Ergocalciferol), gabapentin, doxazosin, allopurinol, acetaminophen, nebivolol, chlorthalidone, and KLOR-CON M20.  No orders of the defined types were placed in this  encounter.    Follow-up: Return if symptoms worsen or fail to improve.  [name], MD

## 2016-02-20 NOTE — Progress Notes (Signed)
Pre visit review using our clinic review tool, if applicable. No additional management support is needed unless otherwise documented below in the visit note. 

## 2016-02-20 NOTE — Patient Instructions (Signed)
Hypertension Hypertension, commonly called high blood pressure, is when the force of blood pumping through your arteries is too strong. Your arteries are the blood vessels that carry blood from your heart throughout your body. A blood pressure reading consists of a higher number over a lower number, such as 110/72. The higher number (systolic) is the pressure inside your arteries when your heart pumps. The lower number (diastolic) is the pressure inside your arteries when your heart relaxes. Ideally you want your blood pressure below 120/80. Hypertension forces your heart to work harder to pump blood. Your arteries may become narrow or stiff. Having untreated or uncontrolled hypertension can cause heart attack, stroke, kidney disease, and other problems. RISK FACTORS Some risk factors for high blood pressure are controllable. Others are not.  Risk factors you cannot control include:   Race. You may be at higher risk if you are African American.  Age. Risk increases with age.  Gender. Men are at higher risk than women before age 45 years. After age 65, women are at higher risk than men. Risk factors you can control include:  Not getting enough exercise or physical activity.  Being overweight.  Getting too much fat, sugar, calories, or salt in your diet.  Drinking too much alcohol. SIGNS AND SYMPTOMS Hypertension does not usually cause signs or symptoms. Extremely high blood pressure (hypertensive crisis) may cause headache, anxiety, shortness of breath, and nosebleed. DIAGNOSIS To check if you have hypertension, your health care provider will measure your blood pressure while you are seated, with your arm held at the level of your heart. It should be measured at least twice using the same arm. Certain conditions can cause a difference in blood pressure between your right and left arms. A blood pressure reading that is higher than normal on one occasion does not mean that you need treatment. If  it is not clear whether you have high blood pressure, you may be asked to return on a different day to have your blood pressure checked again. Or, you may be asked to monitor your blood pressure at home for 1 or more weeks. TREATMENT Treating high blood pressure includes making lifestyle changes and possibly taking medicine. Living a healthy lifestyle can help lower high blood pressure. You may need to change some of your habits. Lifestyle changes may include:  Following the DASH diet. This diet is high in fruits, vegetables, and whole grains. It is low in salt, red meat, and added sugars.  Keep your sodium intake below 2,300 mg per day.  Getting at least 30-45 minutes of aerobic exercise at least 4 times per week.  Losing weight if necessary.  Not smoking.  Limiting alcoholic beverages.  Learning ways to reduce stress. Your health care provider may prescribe medicine if lifestyle changes are not enough to get your blood pressure under control, and if one of the following is true:  You are 18-59 years of age and your systolic blood pressure is above 140.  You are 60 years of age or older, and your systolic blood pressure is above 150.  Your diastolic blood pressure is above 90.  You have diabetes, and your systolic blood pressure is over 140 or your diastolic blood pressure is over 90.  You have kidney disease and your blood pressure is above 140/90.  You have heart disease and your blood pressure is above 140/90. Your personal target blood pressure may vary depending on your medical conditions, your age, and other factors. HOME CARE INSTRUCTIONS    Have your blood pressure rechecked as directed by your health care provider.   Take medicines only as directed by your health care provider. Follow the directions carefully. Blood pressure medicines must be taken as prescribed. The medicine does not work as well when you skip doses. Skipping doses also puts you at risk for  problems.  Do not smoke.   Monitor your blood pressure at home as directed by your health care provider. SEEK MEDICAL CARE IF:   You think you are having a reaction to medicines taken.  You have recurrent headaches or feel dizzy.  You have swelling in your ankles.  You have trouble with your vision. SEEK IMMEDIATE MEDICAL CARE IF:  You develop a severe headache or confusion.  You have unusual weakness, numbness, or feel faint.  You have severe chest or abdominal pain.  You vomit repeatedly.  You have trouble breathing. MAKE SURE YOU:   Understand these instructions.  Will watch your condition.  Will get help right away if you are not doing well or get worse.   This information is not intended to replace advice given to you by your health care provider. Make sure you discuss any questions you have with your health care provider.   Document Released: 06/23/2005 Document Revised: 11/07/2014 Document Reviewed: 04/15/2013 Elsevier Interactive Patient Education 2016 [employer]  

## 2016-02-26 ENCOUNTER — Ambulatory Visit (HOSPITAL_COMMUNITY)
Admission: RE | Admit: 2016-02-26 | Discharge: 2016-02-26 | Disposition: A | Discharge status: Home / Self Care | Payer: Medicare Other | Source: Ambulatory Visit | Attending: Nephrology | Admitting: Nephrology

## 2016-02-26 DIAGNOSIS — D509 Iron deficiency anemia, unspecified: Secondary | ICD-10-CM | POA: Insufficient documentation

## 2016-02-26 MED ORDER — SODIUM CHLORIDE 0.9 % IV SOLN
510.0000 mg | INTRAVENOUS | Status: AC
  Administered 2016-02-26: 510 mg via INTRAVENOUS
  Filled 2016-02-26: qty 17

## 2016-03-02 DIAGNOSIS — G4733 Obstructive sleep apnea (adult) (pediatric): Secondary | ICD-10-CM | POA: Diagnosis not present

## 2016-03-03 ENCOUNTER — Other Ambulatory Visit: Payer: Self-pay | Admitting: Family

## 2016-03-04 ENCOUNTER — Encounter: Payer: Self-pay | Admitting: Vascular Surgery

## 2016-03-06 ENCOUNTER — Encounter: Payer: Self-pay | Admitting: Vascular Surgery

## 2016-03-06 ENCOUNTER — Ambulatory Visit (INDEPENDENT_AMBULATORY_CARE_PROVIDER_SITE_OTHER): Payer: Medicare Other | Admitting: Vascular Surgery

## 2016-03-06 ENCOUNTER — Other Ambulatory Visit (HOSPITAL_COMMUNITY): Payer: Medicare Other

## 2016-03-06 ENCOUNTER — Other Ambulatory Visit: Payer: Self-pay | Admitting: *Deleted

## 2016-03-06 ENCOUNTER — Ambulatory Visit (HOSPITAL_COMMUNITY)
Admission: RE | Admit: 2016-03-06 | Discharge: 2016-03-06 | Disposition: A | Discharge status: Home / Self Care | Payer: Medicare Other | Source: Ambulatory Visit | Attending: Vascular Surgery | Admitting: Vascular Surgery

## 2016-03-06 ENCOUNTER — Ambulatory Visit: Payer: Medicare Other | Admitting: Vascular Surgery

## 2016-03-06 ENCOUNTER — Encounter (HOSPITAL_COMMUNITY): Payer: Medicare Other

## 2016-03-06 ENCOUNTER — Ambulatory Visit (INDEPENDENT_AMBULATORY_CARE_PROVIDER_SITE_OTHER)
Admission: RE | Admit: 2016-03-06 | Discharge: 2016-03-06 | Disposition: A | Discharge status: Home / Self Care | Payer: Medicare Other | Source: Ambulatory Visit | Attending: Vascular Surgery | Admitting: Vascular Surgery

## 2016-03-06 VITALS — BP 123/74 | HR 70 | Temp 97.5°F | Resp 16 | Ht 68.0 in | Wt 225.8 lb

## 2016-03-06 DIAGNOSIS — N184 Chronic kidney disease, stage 4 (severe): Secondary | ICD-10-CM

## 2016-03-06 DIAGNOSIS — Z01810 Encounter for preprocedural cardiovascular examination: Secondary | ICD-10-CM

## 2016-03-06 NOTE — Progress Notes (Signed)
Referring Physician: Elmarie Shiley MD Patient name: Jason [name]. MRN: [id number] DOB: December 21, 1945 Sex: male  REASON FOR CONSULT: Hemodialysis access  HPI: Jason [name]. is a 70 y.o. male,  referred by Dr. Posey Pronto for evaluation to place a hemodialysis access. The patient's last GFR was 17. He also has history of renal related anemia and hypertension. He is right handed. He has not had any prior access procedures. He is not currently on hemodialysis. Other medical problems include history of rectal cancer, hypertension both of which are currently stable.  Past Medical History:  Diagnosis Date  . Anemia   . Arthritis   . Chronic renal insufficiency, stage II (mild)   . COPD (chronic obstructive pulmonary disease) ([place])    pt denies  . Diverticulosis   . ED (erectile dysfunction)   . Gout   . History of radiation therapy 02/21/13-03/31/13   rectum 50.4Gy total dose  . Hyperplastic colon polyp   . Hypertension   . rectal ca dx'd 02/01/13   rectal. Radiation and chemotherapy- remains on chemotherapy-next tx. 08-22-13.   . S/P thoracotomy   . Sleep apnea    no cpap yet   Past Surgical History:  Procedure Laterality Date  . COLON SURGERY  05/20/2013  . COLONOSCOPY  2010   [facility]  . EUS N/A 02/03/2013   Procedure: LOWER ENDOSCOPIC ULTRASOUND (EUS);  Surgeon: Milus Banister, MD;  Location: [facility];  Service: Endoscopy;  Laterality: N/A;  . ILEOSTOMY CLOSURE N/A 08/24/2013   Procedure: CLOSURE OF LOOP ILEOSTOMY ;  Surgeon: Leighton Ruff, MD;  Location: [facility];  Service: General;  Laterality: N/A;  . LAPAROSCOPIC LOW ANTERIOR RESECTION N/A 05/20/2013   Procedure: LAPAROSCOPIC LOW ANTERIOR RESECTION WITH SPLENIC FLEXURE MOBILIZATION;  Surgeon: Leighton Ruff, MD;  Location: [facility];  Service: General;  Laterality: N/A;  . OSTOMY N/A 05/20/2013   Procedure: diverting OSTOMY;  Surgeon: Leighton Ruff, MD;  Location: [facility];  Service: General;  Laterality: N/A;  . VIDEO ASSISTED  THORACOSCOPY (VATS)/WEDGE RESECTION Left 11/20/2015   Procedure: VIDEO ASSISTED THORACOSCOPY (VATS)/LUNG RESECTION;  Surgeon: [name], MD;  Location: [facility];  Service: Thoracic;  Laterality: Left;  Marland Kitchen VIDEO BRONCHOSCOPY N/A 11/20/2015   Procedure: VIDEO BRONCHOSCOPY;  Surgeon: [name], MD;  Location: [facility];  Service: Thoracic;  Laterality: N/A;    Family History  Problem Relation Age of Onset  . Hypertension Other   . Stroke Mother   . Hypertension Mother   . Stroke Father   . Hypertension Father   . Colon cancer Neg Hx     SOCIAL HISTORY: Social History   Social History  . Marital status: Married    Spouse name: [name]  . Number of children: 2  . Years of education: 12   Occupational History  .      retired Insurance account manager   Social History Main Topics  . Smoking status: Former Smoker    Packs/day: 0.50    Years: 10.00    Types: Cigarettes    Quit date: 07/07/1977  . Smokeless tobacco: Never Used  . Alcohol use No     Comment: pint of whisky every 2 weeks and occasional beer until about a month ago-Quit 10'14   . Drug use: No  . Sexual activity: Not on file   Other Topics Concern  . 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 Amlodipine Other (See Comments)    Edema   . Nsaids     Asked by surgeon to add this medication class as intolerance due to patients renal insufficiency.    Current Outpatient Prescriptions  Medication Sig Dispense Refill  . acetaminophen (TYLENOL) 500 MG tablet Take 1,000 mg by mouth every 6 (six) hours as needed for moderate pain or headache.    . allopurinol (ZYLOPRIM) 300 MG tablet TAKE ONE TABLET BY MOUTH ONCE DAILY IN THE MORNING 90 tablet 2  . chlorthalidone (HYGROTON) 25 MG tablet Take 1 tablet (25 mg total) by mouth daily. 30 tablet 5  . doxazosin  (CARDURA) 8 MG tablet Take 1 tablet (8 mg total) by mouth at bedtime. 90 tablet 3  . furosemide (LASIX) 80 MG tablet TAKE ONE TABLET BY MOUTH ONCE DAILY 90 tablet 0  . gabapentin (NEURONTIN) 300 MG capsule Take 300-600 mg by mouth as directed. Take '300mg'$ s morning and '600mg'$ s at bedtime    . KLOR-CON M20 20 MEQ tablet TAKE ONE TABLET BY MOUTH ONCE DAILY 90 tablet 3  . MYRBETRIQ 50 MG TB24 tablet     . nebivolol (BYSTOLIC) 5 MG tablet Take 1 tablet (5 mg total) by mouth daily. 70 tablet 0  . Vitamin D, Ergocalciferol, (DRISDOL) 50000 UNITS CAPS capsule Take 50,000 Units by mouth every 7 (seven) days.     No current facility-administered medications for this visit.     ROS:   General:  No weight loss, Fever, chills  HEENT: No recent headaches, no nasal bleeding, no visual changes, no sore throat  Neurologic: No dizziness, blackouts, seizures. No recent symptoms of stroke or mini- stroke. No recent episodes of slurred speech, or temporary blindness.  Cardiac: No recent episodes of chest pain/pressure, no shortness of breath at rest.  No shortness of breath with exertion.  Denies history of atrial fibrillation or irregular heartbeat  Vascular: No history of rest pain in feet.  No history of claudication.  No history of non-healing ulcer, No history of DVT   Pulmonary: No home oxygen, no productive cough, no hemoptysis,  No asthma or wheezing  Musculoskeletal:  '[ ]'$  Arthritis, '[ ]'$  Low back pain,  '[ ]'$  Joint pain  Hematologic:No history of hypercoagulable state.  No history of easy bleeding.  No history of anemia  Gastrointestinal: No hematochezia or melena,  No gastroesophageal reflux, no trouble swallowing  Urinary: [X chronic Kidney disease, '[ ]'$  on HD - '[ ]'$  MWF or '[ ]'$  TTHS, '[ ]'$  Burning with urination, '[ ]'$  Frequent urination, '[ ]'$  Difficulty urinating;   Skin: No rashes  Psychological: No history of anxiety,  No history of depression   Physical Examination  Vitals:   03/06/16 1221    BP: 123/74  Pulse: 70  Resp: 16  Temp: 97.5 F (36.4 C)  TempSrc: Oral  SpO2: 100%  Weight: 225 lb 12.8 oz (102.4 kg)  Height: '5\' 8"'$  (1.727 m)    Body mass index is 34.33 kg/m.  General:  Alert and oriented, no acute distress HEENT: Normal Neck: No bruit or JVD Pulmonary: Clear to auscultation bilaterally Cardiac: Regular Rate and Rhythm without murmur Abdomen: Soft, non-tender, non-distended, no mass, no scars Skin: No rash Extremity Pulses:  2+ radial, brachial, femoral, dorsalis pedis, posterior tibial pulses bilaterally Musculoskeletal: No deformity or edema  Neurologic: Upper and lower extremity motor  5/5 and symmetric  DATA:  Patient had an upper extremity arterial duplex exam today which showed the left radial artery was quite small. Right radial and brachial arteries were normal size as well as the left. Vein mapping ultrasound showed the left upper arm cephalic vein was 4-5 mm right arm was 2-3 mm  ASSESSMENT:  Patient needs long-term hemodialysis access.  PLAN:  Left brachiocephalic AV fistula scheduled for 03/17/2016. Risks benefits possible palpitations procedure details regarding fistula placement including not limited to bleeding infection ischemic steal fistula non-maturation rate of 15-20%. Patient understands and agrees to proceed.   Ruta Hinds, MD [facility] Office: [phone] Pager: [phone]

## 2016-03-17 ENCOUNTER — Other Ambulatory Visit (HOSPITAL_BASED_OUTPATIENT_CLINIC_OR_DEPARTMENT_OTHER): Payer: Medicare Other

## 2016-03-17 ENCOUNTER — Ambulatory Visit (HOSPITAL_COMMUNITY)
Admission: RE | Admit: 2016-03-17 | Discharge: 2016-03-17 | Disposition: A | Discharge status: Home / Self Care | Payer: Medicare Other | Source: Ambulatory Visit | Attending: Oncology | Admitting: Oncology

## 2016-03-17 DIAGNOSIS — R918 Other nonspecific abnormal finding of lung field: Secondary | ICD-10-CM | POA: Diagnosis not present

## 2016-03-17 DIAGNOSIS — I700 Atherosclerosis of aorta: Secondary | ICD-10-CM | POA: Insufficient documentation

## 2016-03-17 DIAGNOSIS — Z9889 Other specified postprocedural states: Secondary | ICD-10-CM | POA: Insufficient documentation

## 2016-03-17 DIAGNOSIS — D631 Anemia in chronic kidney disease: Secondary | ICD-10-CM

## 2016-03-17 DIAGNOSIS — C20 Malignant neoplasm of rectum: Secondary | ICD-10-CM | POA: Diagnosis not present

## 2016-03-17 DIAGNOSIS — K429 Umbilical hernia without obstruction or gangrene: Secondary | ICD-10-CM | POA: Diagnosis not present

## 2016-03-17 DIAGNOSIS — C218 Malignant neoplasm of overlapping sites of rectum, anus and anal canal: Secondary | ICD-10-CM | POA: Diagnosis not present

## 2016-03-17 DIAGNOSIS — J948 Other specified pleural conditions: Secondary | ICD-10-CM | POA: Diagnosis not present

## 2016-03-17 DIAGNOSIS — N189 Chronic kidney disease, unspecified: Secondary | ICD-10-CM

## 2016-03-17 DIAGNOSIS — K5730 Diverticulosis of large intestine without perforation or abscess without bleeding: Secondary | ICD-10-CM | POA: Insufficient documentation

## 2016-03-17 DIAGNOSIS — S2232XA Fracture of one rib, left side, initial encounter for closed fracture: Secondary | ICD-10-CM | POA: Insufficient documentation

## 2016-03-17 DIAGNOSIS — M5136 Other intervertebral disc degeneration, lumbar region: Secondary | ICD-10-CM | POA: Diagnosis not present

## 2016-03-17 DIAGNOSIS — M47895 Other spondylosis, thoracolumbar region: Secondary | ICD-10-CM | POA: Insufficient documentation

## 2016-03-17 DIAGNOSIS — R911 Solitary pulmonary nodule: Secondary | ICD-10-CM | POA: Diagnosis not present

## 2016-03-17 LAB — CBC WITH DIFFERENTIAL/PLATELET
BASO%: 1.2 % (ref 0.0–2.0)
BASOS ABS: 0.1 10*3/uL (ref 0.0–0.1)
EOS ABS: 0.1 10*3/uL (ref 0.0–0.5)
EOS%: 1 % (ref 0.0–7.0)
HEMATOCRIT: 35.4 % — AB (ref 38.4–49.9)
HGB: 10.9 g/dL — ABNORMAL LOW (ref 13.0–17.1)
LYMPH#: 1.2 10*3/uL (ref 0.9–3.3)
LYMPH%: 15.1 % (ref 14.0–49.0)
MCH: 26.9 pg — AB (ref 27.2–33.4)
MCHC: 30.6 g/dL — ABNORMAL LOW (ref 32.0–36.0)
MCV: 87.9 fL (ref 79.3–98.0)
MONO#: 1.2 10*3/uL — AB (ref 0.1–0.9)
MONO%: 14.6 % — ABNORMAL HIGH (ref 0.0–14.0)
NEUT#: 5.4 10*3/uL (ref 1.5–6.5)
NEUT%: 68.1 % (ref 39.0–75.0)
PLATELETS: 209 10*3/uL (ref 140–400)
RBC: 4.03 10*6/uL — ABNORMAL LOW (ref 4.20–5.82)
RDW: 18.5 % — AB (ref 11.0–14.6)
WBC: 7.9 10*3/uL (ref 4.0–10.3)

## 2016-03-18 LAB — CEA: CEA: 1.8 ng/mL (ref 0.0–4.7)

## 2016-03-18 LAB — CEA (IN HOUSE-CHCC): CEA (CHCC-In House): 1 ng/mL (ref 0.00–5.00)

## 2016-03-21 ENCOUNTER — Encounter (HOSPITAL_COMMUNITY): Payer: Self-pay | Admitting: *Deleted

## 2016-03-21 ENCOUNTER — Telehealth: Payer: Self-pay | Admitting: Oncology

## 2016-03-21 ENCOUNTER — Ambulatory Visit (HOSPITAL_BASED_OUTPATIENT_CLINIC_OR_DEPARTMENT_OTHER): Payer: Medicare Other | Admitting: Nurse Practitioner

## 2016-03-21 VITALS — BP 140/81 | HR 77 | Temp 99.7°F | Resp 18 | Ht 68.0 in | Wt 232.2 lb

## 2016-03-21 DIAGNOSIS — I10 Essential (primary) hypertension: Secondary | ICD-10-CM | POA: Diagnosis not present

## 2016-03-21 DIAGNOSIS — C20 Malignant neoplasm of rectum: Secondary | ICD-10-CM

## 2016-03-21 DIAGNOSIS — Z85048 Personal history of other malignant neoplasm of rectum, rectosigmoid junction, and anus: Secondary | ICD-10-CM | POA: Diagnosis not present

## 2016-03-21 NOTE — Telephone Encounter (Signed)
[facility] WILL CONTACT PT RE GI APPOINTMENT. REFERRAL IN [facility] AND ROUTED TO [facility].

## 2016-03-21 NOTE — Progress Notes (Addendum)
[facility] OFFICE PROGRESS NOTE   Diagnosis:  Rectal cancer  INTERVAL HISTORY:   [name] returns as scheduled. He feels well. Bowels moving regularly. No rectal bleeding. He denies shortness of breath.  Objective:  Vital signs in last 24 hours:  Blood pressure 140/81, pulse 77, temperature 99.7 F (37.6 C), temperature source Oral, resp. rate 18, height '5\' 8"'$  (1.727 m), weight 232 lb 3.2 oz (105.3 kg), SpO2 100 %.    HEENT: No neck mass. Lymphatics: No palpable cervical, supraclavicular, axillary or inguinal lymph nodes. Resp: Lungs clear bilaterally. Cardio: Regular rate and rhythm. GI: Abdomen soft and nontender. No hepatomegaly. No mass. Vascular: No leg edema.   Lab Results:  Lab Results  Component Value Date   WBC 7.9 03/17/2016   HGB 10.9 (L) 03/17/2016   HCT 35.4 (L) 03/17/2016   MCV 87.9 03/17/2016   PLT 209 03/17/2016   NEUTROABS 5.4 03/17/2016    Imaging:  No results found.  Medications: I have reviewed the patient's current medications.  Assessment/Plan: 1. Rectal cancer, clinical stage III (uT3, uN2).  Initiation of concurrent Xeloda and radiation on 02/21/2013, completed 03/31/2013.   CEA normal 04/25/2013   Status post laparoscopic low anterior resection with diverting loop ileostomy 05/23/2013. Final pathology showed a 3.2 cm invasive adenocarcinoma with neoadjuvant related change. Tumor invaded into perirectal soft tissue. There was no lymphovascular or perineural invasion. 14 lymph nodes were negative for tumor. Surgical margins were negative. There was one hyperplastic polyp (ypT3, pN0).   Initiation of weekly 5-FU/leucovorin 08/01/2013   Ileostomy takedown 08/24/2013.   Weekly 5-FU/leucovorin resumed 09/22/2013.  Restaging CTs 02/20/2014 negative for evidence of recurrent disease  restaging CT scans 04/05/2015 negative for evidence of recurrent disease  Surveillance colonoscopy 03/31/2014, status post removal  of of a tubular adenoma from the ascending colon  Enlarging left posterior medial upper lobe nodule on chest CT 04/05/2015 and 07/12/2015  PET scan 10/22/2015 with a hypermetabolic nodule in the medial laxative upper lobe, other lung nodules stable  Left lung wedge resection 11/20/2015 confirmed a 1 cm focus of metastatic adenocarcinoma of colorectal origin, resection margins and multiple lymph nodes negative  CT scans 03/17/2016-bandlike scarring along the left upper lobe nodule resection with a soft tissue density component; calcified bilateral pleural plaques; no findings of recurrence of the abdomen or pelvis.  2. Markedly elevated pretreatment CEA 3. Indeterminate pulmonary/subpleural nodules and right hepatic dome lesion. Unchanged on the CT 04/25/2013, unchanged nodular lung lesions on the CT 02/20/2014, see follow-up CTs/PET above 4. Renal insufficiency. Acute on chronic renal failure during hospitalization November/December 2014. Improved following the ileostomy takedown. 5. History of Anemia secondary chemotherapy, chronic disease, and renal insufficiency 6. Hypertension. 7. Gout. 8. Prolonged postoperative ileus following laparoscopic low anterior resection with diverting loop ileostomy 05/23/2013. 9. C. difficile-positive 09/08/2013-status post Flagyl with improvement. Recurrent diarrhea following completion of Flagyl. Stool positive for C. difficile on 09/24/2013. He completed treatment with vancomycin. The diarrhea resolved. 10. Left greater than right low leg edema. Venous Doppler 09/26/2013 negative for left leg DVT. 11. Rectal urgency/incontinence-Improved with participation in the pelvic physical therapy clinic.   Disposition: [name] remains in clinical remission from rectal cancer. He will return for a CEA and follow-up visit in 4 months. He will contact the office in the interim with any problems.  Next restaging CT evaluation will be at a six-month  interval.  Ned Card ANP/GNP-BC   03/21/2016  3:19 PM

## 2016-03-21 NOTE — Progress Notes (Signed)
Spoke with pt for pre-op call, pt denies cardiac history, chest pain or sob. Pt is not diabetic.

## 2016-03-21 NOTE — Telephone Encounter (Signed)
GAVE PATIENT AVS REPORT AND APPOINTMENTS FOR January  °

## 2016-03-24 ENCOUNTER — Encounter (HOSPITAL_COMMUNITY)
Admission: RE | Disposition: A | Discharge status: Home / Self Care | Payer: Self-pay | Source: Ambulatory Visit | Attending: Vascular Surgery

## 2016-03-24 ENCOUNTER — Inpatient Hospital Stay (HOSPITAL_COMMUNITY): Payer: Medicare Other | Admitting: Anesthesiology

## 2016-03-24 ENCOUNTER — Encounter (HOSPITAL_COMMUNITY): Payer: Self-pay | Admitting: Urology

## 2016-03-24 ENCOUNTER — Ambulatory Visit (HOSPITAL_COMMUNITY)
Admission: RE | Admit: 2016-03-24 | Discharge: 2016-03-24 | Disposition: A | Discharge status: Home / Self Care | Payer: Medicare Other | Source: Ambulatory Visit | Attending: Vascular Surgery | Admitting: Vascular Surgery

## 2016-03-24 DIAGNOSIS — Z87891 Personal history of nicotine dependence: Secondary | ICD-10-CM | POA: Insufficient documentation

## 2016-03-24 DIAGNOSIS — N186 End stage renal disease: Secondary | ICD-10-CM | POA: Diagnosis not present

## 2016-03-24 DIAGNOSIS — Z85048 Personal history of other malignant neoplasm of rectum, rectosigmoid junction, and anus: Secondary | ICD-10-CM | POA: Diagnosis not present

## 2016-03-24 DIAGNOSIS — G4730 Sleep apnea, unspecified: Secondary | ICD-10-CM | POA: Insufficient documentation

## 2016-03-24 DIAGNOSIS — M1990 Unspecified osteoarthritis, unspecified site: Secondary | ICD-10-CM | POA: Insufficient documentation

## 2016-03-24 DIAGNOSIS — J449 Chronic obstructive pulmonary disease, unspecified: Secondary | ICD-10-CM | POA: Diagnosis not present

## 2016-03-24 DIAGNOSIS — Z9221 Personal history of antineoplastic chemotherapy: Secondary | ICD-10-CM | POA: Insufficient documentation

## 2016-03-24 DIAGNOSIS — I120 Hypertensive chronic kidney disease with stage 5 chronic kidney disease or end stage renal disease: Secondary | ICD-10-CM | POA: Diagnosis not present

## 2016-03-24 DIAGNOSIS — N184 Chronic kidney disease, stage 4 (severe): Secondary | ICD-10-CM | POA: Diagnosis not present

## 2016-03-24 DIAGNOSIS — G4733 Obstructive sleep apnea (adult) (pediatric): Secondary | ICD-10-CM | POA: Diagnosis not present

## 2016-03-24 DIAGNOSIS — N185 Chronic kidney disease, stage 5: Secondary | ICD-10-CM

## 2016-03-24 HISTORY — PX: AV FISTULA PLACEMENT: SHX1204

## 2016-03-24 LAB — POCT I-STAT 4, (NA,K, GLUC, HGB,HCT)
GLUCOSE: 110 mg/dL — AB (ref 65–99)
HEMATOCRIT: 37 % — AB (ref 39.0–52.0)
HEMOGLOBIN: 12.6 g/dL — AB (ref 13.0–17.0)
POTASSIUM: 3.3 mmol/L — AB (ref 3.5–5.1)
Sodium: 140 mmol/L (ref 135–145)

## 2016-03-24 SURGERY — ARTERIOVENOUS (AV) FISTULA CREATION
Anesthesia: Monitor Anesthesia Care | Site: Arm Upper | Laterality: Left

## 2016-03-24 MED ORDER — MEPERIDINE HCL 25 MG/ML IJ SOLN: 6.2500 mg | INTRAMUSCULAR | Status: DC | PRN

## 2016-03-24 MED ORDER — LIDOCAINE HCL (PF) 1 % IJ SOLN
INTRAMUSCULAR | Status: DC | PRN
  Administered 2016-03-24: 7 mL via INTRADERMAL

## 2016-03-24 MED ORDER — PROPOFOL 10 MG/ML IV BOLUS
INTRAVENOUS | Status: DC | PRN
  Administered 2016-03-24: 10 mg via INTRAVENOUS

## 2016-03-24 MED ORDER — SODIUM CHLORIDE 0.9 % IV SOLN: INTRAVENOUS | Status: DC

## 2016-03-24 MED ORDER — SODIUM CHLORIDE 0.9 % IV SOLN
INTRAVENOUS | Status: DC
  Administered 2016-03-24 (×2): via INTRAVENOUS

## 2016-03-24 MED ORDER — FENTANYL CITRATE (PF) 100 MCG/2ML IJ SOLN: 25.0000 ug | INTRAMUSCULAR | Status: DC | PRN

## 2016-03-24 MED ORDER — FENTANYL CITRATE (PF) 100 MCG/2ML IJ SOLN
INTRAMUSCULAR | Status: AC
  Filled 2016-03-24: qty 2

## 2016-03-24 MED ORDER — PROPOFOL 10 MG/ML IV BOLUS
INTRAVENOUS | Status: AC
  Filled 2016-03-24: qty 20

## 2016-03-24 MED ORDER — DEXTROSE 5 % IV SOLN
1.5000 g | INTRAVENOUS | Status: AC
  Administered 2016-03-24: 1.5 g via INTRAVENOUS

## 2016-03-24 MED ORDER — CHLORHEXIDINE GLUCONATE CLOTH 2 % EX PADS: 6.0000 | MEDICATED_PAD | Freq: Once | CUTANEOUS | Status: DC

## 2016-03-24 MED ORDER — LIDOCAINE HCL (CARDIAC) 20 MG/ML IV SOLN
INTRAVENOUS | Status: DC | PRN
  Administered 2016-03-24: 50 mg via INTRAVENOUS

## 2016-03-24 MED ORDER — DEXTROSE 5 % IV SOLN
INTRAVENOUS | Status: AC
  Filled 2016-03-24: qty 1.5

## 2016-03-24 MED ORDER — MIDAZOLAM HCL 2 MG/2ML IJ SOLN
INTRAMUSCULAR | Status: AC
  Filled 2016-03-24: qty 2

## 2016-03-24 MED ORDER — OXYCODONE-ACETAMINOPHEN 5-325 MG PO TABS: 1.0000 | ORAL_TABLET | Freq: Four times a day (QID) | ORAL | 0 refills | Status: DC | PRN

## 2016-03-24 MED ORDER — FENTANYL CITRATE (PF) 100 MCG/2ML IJ SOLN
INTRAMUSCULAR | Status: DC | PRN
Start: 2016-03-24 — End: 2016-03-24
  Administered 2016-03-24: 25 ug via INTRAVENOUS

## 2016-03-24 MED ORDER — LACTATED RINGERS IV SOLN: INTRAVENOUS | Status: DC

## 2016-03-24 MED ORDER — ONDANSETRON HCL 4 MG/2ML IJ SOLN
INTRAMUSCULAR | Status: AC
  Filled 2016-03-24: qty 2

## 2016-03-24 MED ORDER — 0.9 % SODIUM CHLORIDE (POUR BTL) OPTIME
TOPICAL | Status: DC | PRN
  Administered 2016-03-24: 1000 mL

## 2016-03-24 MED ORDER — PROMETHAZINE HCL 25 MG/ML IJ SOLN: 6.2500 mg | INTRAMUSCULAR | Status: DC | PRN

## 2016-03-24 MED ORDER — HEPARIN SODIUM (PORCINE) 1000 UNIT/ML IJ SOLN
INTRAMUSCULAR | Status: DC | PRN
  Administered 2016-03-24: 3000 [IU] via INTRAVENOUS

## 2016-03-24 MED ORDER — LIDOCAINE 2% (20 MG/ML) 5 ML SYRINGE
INTRAMUSCULAR | Status: AC
  Filled 2016-03-24: qty 5

## 2016-03-24 MED ORDER — SODIUM CHLORIDE 0.9 % IV SOLN
INTRAVENOUS | Status: DC | PRN
  Administered 2016-03-24: 12:00:00

## 2016-03-24 MED ORDER — LIDOCAINE HCL (PF) 1 % IJ SOLN
INTRAMUSCULAR | Status: AC
  Filled 2016-03-24: qty 30

## 2016-03-24 MED ORDER — PROPOFOL 500 MG/50ML IV EMUL
INTRAVENOUS | Status: DC | PRN
  Administered 2016-03-24: 50 ug/kg/min via INTRAVENOUS

## 2016-03-24 MED ORDER — MIDAZOLAM HCL 5 MG/5ML IJ SOLN
INTRAMUSCULAR | Status: DC | PRN
  Administered 2016-03-24 (×2): 1 mg via INTRAVENOUS

## 2016-03-24 SURGICAL SUPPLY — 27 items
ARMBAND PINK RESTRICT EXTREMIT (MISCELLANEOUS) ×4 IMPLANT
CANISTER SUCTION 2500CC (MISCELLANEOUS) ×2 IMPLANT
CANNULA VESSEL 3MM 2 BLNT TIP (CANNULA) ×2 IMPLANT
CLIP TI MEDIUM 6 (CLIP) ×2 IMPLANT
CLIP TI WIDE RED SMALL 6 (CLIP) ×2 IMPLANT
COVER PROBE W GEL 5X96 (DRAPES) IMPLANT
DECANTER SPIKE VIAL GLASS SM (MISCELLANEOUS) ×2 IMPLANT
DRAIN PENROSE 1/4X12 LTX STRL (WOUND CARE) ×2 IMPLANT
ELECT REM PT RETURN 9FT ADLT (ELECTROSURGICAL) ×2
ELECTRODE REM PT RTRN 9FT ADLT (ELECTROSURGICAL) ×1 IMPLANT
GLOVE BIO SURGEON STRL SZ7.5 (GLOVE) ×2 IMPLANT
GOWN STRL REUS W/ TWL LRG LVL3 (GOWN DISPOSABLE) ×3 IMPLANT
GOWN STRL REUS W/TWL LRG LVL3 (GOWN DISPOSABLE) ×6
KIT BASIN OR (CUSTOM PROCEDURE TRAY) ×2 IMPLANT
KIT ROOM TURNOVER OR (KITS) ×2 IMPLANT
LIQUID BAND (GAUZE/BANDAGES/DRESSINGS) ×2 IMPLANT
LOOP VESSEL MINI RED (MISCELLANEOUS) IMPLANT
NS IRRIG 1000ML POUR BTL (IV SOLUTION) ×2 IMPLANT
PACK CV ACCESS (CUSTOM PROCEDURE TRAY) ×2 IMPLANT
PAD ARMBOARD 7.5X6 YLW CONV (MISCELLANEOUS) ×4 IMPLANT
SPONGE SURGIFOAM ABS GEL 100 (HEMOSTASIS) IMPLANT
SUT PROLENE 7 0 BV 1 (SUTURE) ×2 IMPLANT
SUT VIC AB 3-0 SH 27 (SUTURE) ×2
SUT VIC AB 3-0 SH 27X BRD (SUTURE) ×1 IMPLANT
SUT VICRYL 4-0 PS2 18IN ABS (SUTURE) ×2 IMPLANT
UNDERPAD 30X30 (UNDERPADS AND DIAPERS) ×2 IMPLANT
WATER STERILE IRR 1000ML POUR (IV SOLUTION) ×2 IMPLANT

## 2016-03-24 NOTE — H&P (View-Only) (Signed)
Referring Physician: Elmarie Shiley MD Patient name: Jason MCKIM Sr. MRN: [id number] DOB: 1946-05-13 Sex: male  REASON FOR CONSULT: Hemodialysis access  HPI: Jason CHAGNON Sr. is a 70 y.o. male,  referred by Dr. Posey Pronto for evaluation to place a hemodialysis access. The patient's last GFR was 17. He also has history of renal related anemia and hypertension. He is right handed. He has not had any prior access procedures. He is not currently on hemodialysis. Other medical problems include history of rectal cancer, hypertension both of which are currently stable.  Past Medical History:  Diagnosis Date  . Anemia   . Arthritis   . Chronic renal insufficiency, stage II (mild)   . COPD (chronic obstructive pulmonary disease) ([place])    pt denies  . Diverticulosis   . ED (erectile dysfunction)   . Gout   . History of radiation therapy 02/21/13-03/31/13   rectum 50.4Gy total dose  . Hyperplastic colon polyp   . Hypertension   . rectal ca dx'd 02/01/13   rectal. Radiation and chemotherapy- remains on chemotherapy-next tx. 08-22-13.   . S/P thoracotomy   . Sleep apnea    no cpap yet   Past Surgical History:  Procedure Laterality Date  . COLON SURGERY  05/20/2013  . COLONOSCOPY  2010   [facility]  . EUS N/A 02/03/2013   Procedure: LOWER ENDOSCOPIC ULTRASOUND (EUS);  Surgeon: Milus Banister, MD;  Location: [facility];  Service: Endoscopy;  Laterality: N/A;  . ILEOSTOMY CLOSURE N/A 08/24/2013   Procedure: CLOSURE OF LOOP ILEOSTOMY ;  Surgeon: Leighton Ruff, MD;  Location: [facility];  Service: General;  Laterality: N/A;  . LAPAROSCOPIC LOW ANTERIOR RESECTION N/A 05/20/2013   Procedure: LAPAROSCOPIC LOW ANTERIOR RESECTION WITH SPLENIC FLEXURE MOBILIZATION;  Surgeon: Leighton Ruff, MD;  Location: [facility];  Service: General;  Laterality: N/A;  . OSTOMY N/A 05/20/2013   Procedure: diverting OSTOMY;  Surgeon: Leighton Ruff, MD;  Location: [facility];  Service: General;  Laterality: N/A;  . VIDEO ASSISTED  THORACOSCOPY (VATS)/WEDGE RESECTION Left 11/20/2015   Procedure: VIDEO ASSISTED THORACOSCOPY (VATS)/LUNG RESECTION;  Surgeon: [name], MD;  Location: [facility];  Service: Thoracic;  Laterality: Left;  Marland Kitchen VIDEO BRONCHOSCOPY N/A 11/20/2015   Procedure: VIDEO BRONCHOSCOPY;  Surgeon: [name], MD;  Location[facility];  Service: Thoracic;  Laterality: N/A;    Family History  Problem Relation Age of Onset  . Hypertension Other   . Stroke Mother   . Hypertension Mother   . Stroke Father   . Hypertension Father   . Colon cancer Neg Hx     SOCIAL HISTORY: Social History   Social History  . Marital status: Married    Spouse name: [name]  . Number of children: 2  . Years of education: 12   Occupational History  .      retired Insurance account manager   Social History Main Topics  . Smoking status: Former Smoker    Packs/day: 0.50    Years: 10.00    Types: Cigarettes    Quit date: 07/07/1977  . Smokeless tobacco: Never Used  . Alcohol use No     Comment: pint of whisky every 2 weeks and occasional beer until about a month ago-Quit 10'14   . Drug use: No  . Sexual activity: Not on file   Other Topics Concern  . Not on file   Social History Narrative   Married to wife, [name]   Retired Insurance account manager w/[employer]   #2 grown children and #6 grandchildren    Allergies  Allergen Reactions  . Amlodipine Other (See Comments)    Edema   . Nsaids     Asked by surgeon to add this medication class as intolerance due to patients renal insufficiency.    Current Outpatient Prescriptions  Medication Sig Dispense Refill  . acetaminophen (TYLENOL) 500 MG tablet Take 1,000 mg by mouth every 6 (six) hours as needed for moderate pain or headache.    . allopurinol (ZYLOPRIM) 300 MG tablet TAKE ONE TABLET BY MOUTH ONCE DAILY IN THE MORNING 90 tablet 2  . chlorthalidone (HYGROTON) 25 MG tablet Take 1 tablet (25 mg total) by mouth daily. 30 tablet 5  . doxazosin  (CARDURA) 8 MG tablet Take 1 tablet (8 mg total) by mouth at bedtime. 90 tablet 3  . furosemide (LASIX) 80 MG tablet TAKE ONE TABLET BY MOUTH ONCE DAILY 90 tablet 0  . gabapentin (NEURONTIN) 300 MG capsule Take 300-600 mg by mouth as directed. Take '300mg'$ s morning and '600mg'$ s at bedtime    . KLOR-CON M20 20 MEQ tablet TAKE ONE TABLET BY MOUTH ONCE DAILY 90 tablet 3  . MYRBETRIQ 50 MG TB24 tablet     . nebivolol (BYSTOLIC) 5 MG tablet Take 1 tablet (5 mg total) by mouth daily. 70 tablet 0  . Vitamin D, Ergocalciferol, (DRISDOL) 50000 UNITS CAPS capsule Take 50,000 Units by mouth every 7 (seven) days.     No current facility-administered medications for this visit.     ROS:   General:  No weight loss, Fever, chills  HEENT: No recent headaches, no nasal bleeding, no visual changes, no sore throat  Neurologic: No dizziness, blackouts, seizures. No recent symptoms of stroke or mini- stroke. No recent episodes of slurred speech, or temporary blindness.  Cardiac: No recent episodes of chest pain/pressure, no shortness of breath at rest.  No shortness of breath with exertion.  Denies history of atrial fibrillation or irregular heartbeat  Vascular: No history of rest pain in feet.  No history of claudication.  No history of non-healing ulcer, No history of DVT   Pulmonary: No home oxygen, no productive cough, no hemoptysis,  No asthma or wheezing  Musculoskeletal:  '[ ]'$  Arthritis, '[ ]'$  Low back pain,  '[ ]'$  Joint pain  Hematologic:No history of hypercoagulable state.  No history of easy bleeding.  No history of anemia  Gastrointestinal: No hematochezia or melena,  No gastroesophageal reflux, no trouble swallowing  Urinary: [X chronic Kidney disease, '[ ]'$  on HD - '[ ]'$  MWF or '[ ]'$  TTHS, '[ ]'$  Burning with urination, '[ ]'$  Frequent urination, '[ ]'$  Difficulty urinating;   Skin: No rashes  Psychological: No history of anxiety,  No history of depression   Physical Examination  Vitals:   03/06/16 1221    BP: 123/74  Pulse: 70  Resp: 16  Temp: 97.5 F (36.4 C)  TempSrc: Oral  SpO2: 100%  Weight: 225 lb 12.8 oz (102.4 kg)  Height: '5\' 8"'$  (1.727 m)    Body mass index is 34.33 kg/m.  General:  Alert and oriented, no acute distress HEENT: Normal Neck: No bruit or JVD Pulmonary: Clear to auscultation bilaterally Cardiac: Regular Rate and Rhythm without murmur Abdomen: Soft, non-tender, non-distended, no mass, no scars Skin: No rash Extremity Pulses:  2+ radial, brachial, femoral, dorsalis pedis, posterior tibial pulses bilaterally Musculoskeletal: No deformity or edema  Neurologic: Upper and lower extremity motor  5/5 and symmetric  DATA:  Patient had an upper extremity arterial duplex exam today which showed the left radial artery was quite small. Right radial and brachial arteries were normal size as well as the left. Vein mapping ultrasound showed the left upper arm cephalic vein was 4-5 mm right arm was 2-3 mm  ASSESSMENT:  Patient needs long-term hemodialysis access.  PLAN:  Left brachiocephalic AV fistula scheduled for 03/17/2016. Risks benefits possible palpitations procedure details regarding fistula placement including not limited to bleeding infection ischemic steal fistula non-maturation rate of 15-20%. Patient understands and agrees to proceed.   Ruta Hinds, MD [facility] Office: [phone] Pager: [phone]

## 2016-03-24 NOTE — Transfer of Care (Signed)
Immediate Anesthesia Transfer of Care Note  Patient: Jason Cotta Sr.  Procedure(s) Performed: Procedure(s): CREATION OF LEFT BRACIOCEPHALIC ARTERIOVENOUS (AV) FISTULA (Left)  Patient Location: PACU  Anesthesia Type:MAC  Level of Consciousness: awake, alert , oriented and patient cooperative  Airway & Oxygen Therapy: Patient Spontanous Breathing and Patient connected to face mask oxygen  Post-op Assessment: Report given to RN and Post -op Vital signs reviewed and stable  Post vital signs: Reviewed  Last Vitals:  Vitals:   03/24/16 0838  BP: 114/73  Pulse: 82  Resp: 20  Temp: 36.8 C    Last Pain:  Vitals:   03/24/16 0838  TempSrc: Oral         Complications: No apparent anesthesia complications

## 2016-03-24 NOTE — Anesthesia Preprocedure Evaluation (Signed)
Anesthesia Evaluation  Patient identified by MRN, date of birth, ID band Patient awake    Reviewed: Allergy & Precautions, NPO status , Patient's Chart, lab work & pertinent test results  Airway Mallampati: II  TM Distance: >3 FB Neck ROM: Full    Dental  (+) Upper Dentures, Lower Dentures   Pulmonary sleep apnea , COPD, former smoker,    breath sounds clear to auscultation       Cardiovascular hypertension,  Rhythm:Regular Rate:Normal     Neuro/Psych  Neuromuscular disease negative psych ROS   GI/Hepatic negative GI ROS, Neg liver ROS,   Endo/Other  negative endocrine ROS  Renal/GU CRFRenal disease  negative genitourinary   Musculoskeletal  (+) Arthritis , Osteoarthritis,    Abdominal Normal abdominal exam  (+)   Peds negative pediatric ROS (+)  Hematology negative hematology ROS (+)   Anesthesia Other Findings   Reproductive/Obstetrics negative OB ROS                             Anesthesia Physical Anesthesia Plan  ASA: III  Anesthesia Plan: MAC   Post-op Pain Management:    Induction: Intravenous  Airway Management Planned: Natural Airway  Additional Equipment:   Intra-op Plan:   Post-operative Plan:   Informed Consent: I have reviewed the patients History and Physical, chart, labs and discussed the procedure including the risks, benefits and alternatives for the proposed anesthesia with the patient or authorized representative who has indicated his/her understanding and acceptance.     Plan Discussed with: CRNA  Anesthesia Plan Comments:         Anesthesia Quick Evaluation

## 2016-03-24 NOTE — Interval H&P Note (Signed)
History and Physical Interval Note:  03/24/2016 11:42 AM  Jason Cotta Sr.  has presented today for surgery, with the diagnosis of Chronic kidney disease Stage four  The various methods of treatment have been discussed with the patient and family. After consideration of risks, benefits and other options for treatment, the patient has consented to  Procedure(s): ARTERIOVENOUS (AV) FISTULA CREATION LEFT BRACHIOCEPHALIC (Left) as a surgical intervention .  The patient's history has been reviewed, patient examined, no change in status, stable for surgery.  I have reviewed the patient's chart and labs.  Questions were answered to the patient's satisfaction.     Ruta Hinds

## 2016-03-24 NOTE — Anesthesia Procedure Notes (Signed)
Procedure Name: MAC Date/Time: 03/24/2016 11:50 AM Performed by: Jenne Campus Pre-anesthesia Checklist: Patient identified, Emergency Drugs available, Suction available, Patient being monitored and Timeout performed Patient Re-evaluated:Patient Re-evaluated prior to inductionOxygen Delivery Method: Simple face mask

## 2016-03-24 NOTE — Op Note (Signed)
Procedure: Left Brachial Cephalic AV fistula  Preop: ESRD  Postop: ESRD  Anesthesia: Local with IV sedation  Assistant: Gerri Lins PA-c  Findings: 3.5 mm cephalic vein. Possible high brachial bifurcation  Procedure: After obtaining informed consent, the patient was taken to the operating room.  After induction of general anesthesia, the left upper extremity was prepped and draped in usual sterile fashion.  Local anesthesia was infiltrated near the antecubital crease.  A transverse incision was then made near the antecubital crease the left arm. The incision was carried into the subcutaneous tissues down to level of the cephalic vein. The cephalic vein was approximately 3.5 mm in diameter. It was of good quality. This was dissected free circumferentially and small side branches ligated and divided between silk ties or clips. Next the brachial artery was dissected free in the medial portion of the incision. The artery was  3 mm in diameter. The was a superficial and deep branch suggestive of a high brachial bifurcation.  The deeper branch was about 3 mm compared to the superficial branch which was about 2.5 mm.  The vessel loops were placed proximal and distal to the planned site of arteriotomy. The patient was given 3000 units of intravenous heparin. After appropriate circulation time, the vessel loops were used to control the artery. A longitudinal opening was made in the brachial artery.  The vein was ligated distally with a 2-0 silk tie. The vein was controlled proximally with a fine bulldog clamp. The vein was then swung over to the artery and sewn end of vein to side of artery using a running 7-0 Prolene suture. Just prior to completion of the anastomosis, everything was fore bled back bled and thoroughly flushed. The anastomosis was secured, vessel loops released, and there was a palpable thrill in the fistula immediately. After hemostasis was obtained, the subcutaneous tissues were  reapproximated using a running 3-0 Vicryl suture. The skin was then closed with a 4 0 Vicryl subcuticular stitch. Dermabond was applied to the skin incision.  The patient had a palpable radial pulse at the end of the case.  Ruta Hinds, MD [facility] Office: [phone] Pager: [phone]

## 2016-03-24 NOTE — Anesthesia Postprocedure Evaluation (Signed)
Anesthesia Post Note  Patient: Jason VAQUERA Sr.  Procedure(s) Performed: Procedure(s) (LRB): CREATION OF LEFT BRACIOCEPHALIC ARTERIOVENOUS (AV) FISTULA (Left)  Patient location during evaluation: PACU Anesthesia Type: MAC Level of consciousness: awake and alert Pain management: pain level controlled Vital Signs Assessment: post-procedure vital signs reviewed and stable Respiratory status: spontaneous breathing, nonlabored ventilation, respiratory function stable and patient connected to nasal cannula oxygen Cardiovascular status: stable and blood pressure returned to baseline Anesthetic complications: no    Last Vitals:  Vitals:   03/24/16 1300 03/24/16 1330  BP: 119/70 111/67  Pulse:    Resp:  16  Temp: 36.5 C     Last Pain:  Vitals:   03/24/16 0838  TempSrc: Oral                 Effie Berkshire

## 2016-03-25 ENCOUNTER — Encounter (HOSPITAL_COMMUNITY): Payer: Self-pay | Admitting: Vascular Surgery

## 2016-03-27 ENCOUNTER — Ambulatory Visit: Payer: Medicare Other | Admitting: Cardiothoracic Surgery

## 2016-03-27 ENCOUNTER — Telehealth: Payer: Self-pay | Admitting: Vascular Surgery

## 2016-03-27 NOTE — Telephone Encounter (Signed)
Spoke to pt about appt around 10/26 @ 3:15

## 2016-03-27 NOTE — Telephone Encounter (Signed)
-----   Message from Mena Goes, RN sent at 03/24/2016  2:16 PM EDT ----- Regarding: schedule   ----- Message ----- From: Ulyses Amor, PA-C Sent: 03/24/2016  12:59 PM To: [facility] Charge Pool  F/U in 4-6 weeks with Dr. Oneida Alar /P Left AV fistula needs duplex of fistula

## 2016-03-31 ENCOUNTER — Telehealth: Payer: Self-pay | Admitting: Emergency Medicine

## 2016-03-31 DIAGNOSIS — N189 Chronic kidney disease, unspecified: Secondary | ICD-10-CM | POA: Diagnosis not present

## 2016-03-31 DIAGNOSIS — I10 Essential (primary) hypertension: Secondary | ICD-10-CM

## 2016-03-31 DIAGNOSIS — N184 Chronic kidney disease, stage 4 (severe): Secondary | ICD-10-CM | POA: Diagnosis not present

## 2016-03-31 LAB — CBC AND DIFFERENTIAL
HEMATOCRIT: 33 % — AB (ref 41–53)
HEMOGLOBIN: 11.2 g/dL — AB (ref 13.5–17.5)
PLATELETS: 256 10*3/uL (ref 150–399)
WBC: 6.6 10^3/mL

## 2016-03-31 LAB — BASIC METABOLIC PANEL
BUN: 83 mg/dL — AB (ref 4–21)
CREATININE: 4.6 mg/dL — AB (ref 0.6–1.3)
Glucose: 108 mg/dL
Potassium: 3.9 mmol/L (ref 3.4–5.3)
Sodium: 137 mmol/L (ref 137–147)

## 2016-03-31 MED ORDER — NEBIVOLOL HCL 5 MG PO TABS: 5.0000 mg | ORAL_TABLET | Freq: Every day | ORAL | 5 refills | Status: DC

## 2016-03-31 NOTE — Telephone Encounter (Signed)
Pt called and is out of the sample you gave him. He is wondering if you can call him in a prescription for it. Thanks.

## 2016-03-31 NOTE — Telephone Encounter (Signed)
Tried to call pt back. Call was disconnected. Was not able to get back through.

## 2016-03-31 NOTE — Telephone Encounter (Signed)
Rec'd call pt states he is almost finish with the Bystolic MD gave him, and wanting rx sent to pharmacy. Verified pharmacy inform will send to [place]...Johny Chess

## 2016-04-03 ENCOUNTER — Ambulatory Visit: Payer: Medicare Other | Admitting: Cardiothoracic Surgery

## 2016-04-08 DIAGNOSIS — N184 Chronic kidney disease, stage 4 (severe): Secondary | ICD-10-CM | POA: Diagnosis not present

## 2016-04-08 DIAGNOSIS — D631 Anemia in chronic kidney disease: Secondary | ICD-10-CM | POA: Diagnosis not present

## 2016-04-08 DIAGNOSIS — I10 Essential (primary) hypertension: Secondary | ICD-10-CM | POA: Diagnosis not present

## 2016-04-08 DIAGNOSIS — N2581 Secondary hyperparathyroidism of renal origin: Secondary | ICD-10-CM | POA: Diagnosis not present

## 2016-04-09 ENCOUNTER — Ambulatory Visit: Payer: Medicare Other | Admitting: Cardiothoracic Surgery

## 2016-04-10 ENCOUNTER — Ambulatory Visit: Payer: Medicare Other | Admitting: Cardiothoracic Surgery

## 2016-04-14 ENCOUNTER — Encounter: Payer: Self-pay | Admitting: Internal Medicine

## 2016-04-14 LAB — IRON AND TIBC: Iron Saturation: 27

## 2016-04-14 LAB — PTH, INTACT: PTH INTERP: 165

## 2016-04-14 LAB — MAGNESIUM: Magnesium, Serum: 1.8

## 2016-04-14 LAB — FERRITIN, SERUM (SERIAL): FERRITIN: 554

## 2016-04-14 NOTE — Progress Notes (Signed)
Faxed results from [facility].

## 2016-04-15 ENCOUNTER — Ambulatory Visit (INDEPENDENT_AMBULATORY_CARE_PROVIDER_SITE_OTHER): Payer: Medicare Other | Admitting: Cardiothoracic Surgery

## 2016-04-15 ENCOUNTER — Encounter: Payer: Self-pay | Admitting: Cardiothoracic Surgery

## 2016-04-15 VITALS — BP 116/71 | HR 84 | Resp 16 | Ht 68.0 in | Wt 225.0 lb

## 2016-04-15 DIAGNOSIS — C3411 Malignant neoplasm of upper lobe, right bronchus or lung: Secondary | ICD-10-CM

## 2016-04-15 DIAGNOSIS — Z09 Encounter for follow-up examination after completed treatment for conditions other than malignant neoplasm: Secondary | ICD-10-CM | POA: Diagnosis not present

## 2016-04-15 NOTE — Progress Notes (Signed)
[address]       [place] [address]             [phone]      Lynda K Macmillan Sr. [facility] Medical Record #[id number] Date of Birth: [birth year]  Referring: [name], MD Primary Care: [name], MD  Chief Complaint:   POST OP FOLLOW UP  Rectal cancer [facility])   Staging form: Colon and Rectum, AJCC 7th Edition     Clinical stage from 11/23/2015: Stage IVA (T3, N2, M1a) - Signed by [name], MD on 11/23/2015  11/20/2015  OPERATIVE REPORT PREOPERATIVE DIAGNOSIS: Left upper lobe lung mass. POSTOPERATIVE DIAGNOSIS: Left upper lobe lung mass. Adenocarcinoma of colonic origin by frozen section. PROCEDURE PERFORMED: Bronchoscopy, left video-assisted thoracoscopy, mini thoracotomy, left upper lobe lung resection with node sampling, and placement of On-Q device. SURGEON: [name], MD.  History of Present Illness:     Patient returns after recent wedge resection of metastatic colon cancer from the left upper lobe. Overall he is making good progress grass postoperatively. Last month he had a  New left arm fistula placed anticipating the need in the future because of his progressive renall failure and the potential need for dialysis in the future.   Past Medical History:  Diagnosis Date  . Anemia   . Arthritis   . Chronic renal insufficiency, stage II (mild)   . COPD (chronic obstructive pulmonary disease) ([place])    pt denies  . Diverticulosis   . ED (erectile dysfunction)   . Gout   . History of radiation therapy 02/21/13-03/31/13   rectum 50.4Gy total dose  . Hyperplastic colon polyp   . Hypertension   . rectal ca dx'd 02/01/13   rectal. Radiation and chemotherapy- remains on chemotherapy-next tx. 08-22-13.   . S/P thoracotomy   . Sleep apnea    uses cpap     History  Smoking Status  . Former Smoker  . Packs/day: 0.50  . Years: 10.00  . Types: Cigarettes  . Quit date: 07/07/1977  Smokeless Tobacco  . Never Used      History  Alcohol Use  . 0.0 oz/week    Comment: drinks occasionally     Allergies  Allergen Reactions  . Nsaids Other (See Comments)    Asked by surgeon to add this medication class as intolerance due to patients renal insufficiency.  . Amlodipine Other (See Comments)    Edema     Current Outpatient Prescriptions  Medication Sig Dispense Refill  . acetaminophen (TYLENOL) 500 MG tablet Take 1,000 mg by mouth every 6 (six) hours as needed for moderate pain or headache.    . allopurinol (ZYLOPRIM) 300 MG tablet TAKE ONE TABLET BY MOUTH ONCE DAILY IN THE MORNING (Patient taking differently: Take 300 mg by mouth once daily) 90 tablet 2  . calcitRIOL (ROCALTROL) 0.25 MCG capsule Take 0.25 mcg by mouth daily.     . chlorthalidone (HYGROTON) 25 MG tablet Take 1 tablet (25 mg total) by mouth daily. 30 tablet 5  . doxazosin (CARDURA) 8 MG tablet Take 1 tablet (8 mg total) by mouth at bedtime. 90 tablet 3  . furosemide (LASIX) 80 MG tablet TAKE ONE TABLET BY MOUTH ONCE DAILY (Patient taking differently: Take 80 mg by mouth once daily) 90 tablet 0  . gabapentin (NEURONTIN) 300 MG capsule Take 300-600 mg by mouth See admin instructions. Take '300mg'$ s morning and '600mg'$ s at bedtime    . KLOR-CON M20  20 MEQ tablet TAKE ONE TABLET BY MOUTH ONCE DAILY (Patient taking differently: Take 20 MEQ by mouth once daily) 90 tablet 3  . MYRBETRIQ 50 MG TB24 tablet Take 50 mg by mouth daily.     . nebivolol (BYSTOLIC) 5 MG tablet Take 1 tablet (5 mg total) by mouth daily. 30 tablet 5  . oxyCODONE-acetaminophen (PERCOCET/ROXICET) 5-325 MG tablet Take 1 tablet by mouth every 6 (six) hours as needed. 6 tablet 0  . Polyethyl Glycol-Propyl Glycol (SYSTANE OP) Place 1-2 drops into both eyes as needed (for dry eyes).    . Vitamin D, Ergocalciferol, (DRISDOL) 50000 UNITS CAPS capsule Take 50,000 Units by mouth every Sunday.      No current facility-administered medications for this visit.        Physical Exam: BP  116/71   Pulse 84   Resp 16   Ht '5\' 8"'$  (1.727 m)   Wt 225 lb (102.1 kg)   SpO2 96% Comment: ON RA  BMI 34.21 kg/m   General appearance: alert, cooperative and no distress Neurologic: intact Heart: regular rate and rhythm, S1, S2 normal, no murmur, click, rub or gallop Lungs: clear to auscultation bilaterally Abdomen: soft, non-tender; bowel sounds normal; no masses,  no organomegaly Extremities: extremities normal, atraumatic, no cyanosis or edema Wound: Wounds are well healed on the left chest   Diagnostic Studies & Laboratory data:     Recent Radiology Findings:   Ct Chest Wo Contrast  Result Date: 03/18/2016 CLINICAL DATA:  Restaging of rectal cancer. Original diagnosis 02/01/2013. A left upper lobe metastatic lesion was resected on 11/20/2015 Where following up pleural lesions. Prior chemotherapy and radiation therapy as well as colon resection. History of hypertension, gout, diverticulosis, and chronic renal disease. EXAM: CT CHEST, ABDOMEN AND PELVIS WITHOUT CONTRAST TECHNIQUE: Multidetector CT imaging of the chest, abdomen and pelvis was performed following the standard protocol without IV contrast. COMPARISON:  10/22/2015 FINDINGS: CT CHEST FINDINGS Cardiovascular: Coronary, aortic arch, and branch vessel atherosclerotic vascular disease. Mediastinum/Nodes: 4 mm in short axis right internal mammary node, image 23/2. No overtly pathologic adenopathy identified in the chest. Lungs/Pleura: Postoperative findings from left upper lobe nodule resection observed including bandlike scarring in the left upper lobe, images 47-56 of series 4 there is soft tissue density in this bandlike scarring along with wedge resection clips, and surveillance to ensure lack of progressive soft tissue growth would be recommended. Stable mildly calcified right upper lobe pleural plaque, 7 mm in thickness on image 32/4, previously the same. Right upper lobe nodule measuring 0.6 by 0.8 cm on image 47/4, unchanged  by my measurements from April, with a band like extension of density extending to the pleural surface on the right, and with an adjacent 4 mm nodule on image 44/4. Other sites of calcified pleural plaques in the right lung are noted. Some of these track along the right hemidiaphragm. Calcified pleural plaques are also present on the left. Healing left sixth rib fracture or osteotomy. Musculoskeletal: Considerable thoracic spondylosis. CT ABDOMEN PELVIS FINDINGS Hepatobiliary: Unremarkable Pancreas: Unremarkable Spleen: Unremarkable Adrenals/Urinary Tract: Adrenal glands normal. Multiple hyperdense lesions of both kidneys are present in could represent complex cysts or masses, these are not appreciably changed from these CT dated shown on prior PET-CT. Stomach/Bowel: Colonic diverticulosis, most notable in the ascending colon, without active diverticulitis. The colon appears somewhat short compatible with prior partial colectomy. Anastomotic staple line in the right colon. Vascular/Lymphatic: Aortoiliac atherosclerotic vascular disease. Small gastrohepatic ligament lymph nodes are observed. A  peripancreatic node measures 1.2 cm in short axis on image 55/2, previously 1.0 cm and not previously hypermetabolic on PET-CT. Reproductive: Poor definition of tissue planes around the prostate gland. This may be due to regional therapy given the presacral stranding. Other: Presacral edema/stranding with chronic poor definition of fat plane between the right seminal vesicle on the rectum. Musculoskeletal: Prior right colostomy, taken down. A loop of small bowel extends towards the defect in the rectus abdominus musculature associated with this prior ostomy, image 85/2, but is not overtly herniated. Small umbilical hernia, image 68/032. There are 11 pairs of ribs and the ribs at T12 are hypoplastic. L5 is transitional. There is considerable suspected impingement at L4-5 due to degenerative disc disease and facet arthropathy.  Small focus of avascular necrosis along the left femoral head, image 98/602. Moderate degenerative chondral thinning in both hips. IMPRESSION: 1. Along the left upper lobe nodule resection there is bandlike scarring with a soft tissue density component which merits observation, but is currently not specific for recurrence. 2. Calcified bilateral pleural plaques. 3. No findings of recurrence in the abdomen or pelvis. 4. Multiple complex lesions of both kidneys are likely complex cysts but are technically nonspecific on today' s noncontrast exam. 5. Postoperative findings in the lower pelvis, stable. 6. Other imaging findings of potential clinical significance: Colonic diverticulosis. Aortoiliac atherosclerotic vascular disease. Small umbilical hernia. Considerable impingement at L4-5 due to degenerative disc disease and facet arthropathy (L5 is transitional). Small focus of chronic avascular necrosis in the left femoral head. Moderate degenerative chondral thinning in both hips. Thoracolumbar spondylosis. Healing left sixth rib fracture. Electronically Signed   By: Van Clines M.D.   On: 03/18/2016 09:16     Recent Lab Findings: Lab Results  Component Value Date   WBC 6.6 03/31/2016   HGB 11.2 (A) 03/31/2016   HCT 33 (A) 03/31/2016   PLT 256 03/31/2016   GLUCOSE 110 (H) 03/24/2016   CHOL 208 (H) 06/04/2015   TRIG 203.0 (H) 06/04/2015   HDL 34.80 (L) 06/04/2015   LDLDIRECT 120.0 06/04/2015   LDLCALC 131 (H) 05/29/2010   ALT 12 (L) 11/22/2015   AST 21 11/22/2015   NA 137 03/31/2016   K 3.9 03/31/2016   CL 97 01/09/2016   CREATININE 4.6 (A) 03/31/2016   BUN 83 (A) 03/31/2016   CO2 29 01/09/2016   TSH 1.90 08/22/2015   INR 1.11 11/16/2015   HGBA1C 6.0 12/20/2009      Assessment / Plan:     Stable after  wedge resection of the left upper lobe for metastatic colon cancer. Patient has follow-up with Dr. Benay Spice.  Patient is scheduled for CT scans of the chest every 6 months, to cut  down on the patient's number of visits I've not made him a follow-up appointment to see me as he is carefully followed in oncology clinic but would be glad to see him at his or Dr. Gearldine Shown requested anytime.  [name] MD      [address] [place] [address] Office [phone]   [place]eeper [phone]  04/18/2016 1:43 PM

## 2016-04-28 ENCOUNTER — Ambulatory Visit: Payer: Medicare Other | Admitting: Gastroenterology

## 2016-04-29 ENCOUNTER — Ambulatory Visit (INDEPENDENT_AMBULATORY_CARE_PROVIDER_SITE_OTHER): Payer: Medicare Other

## 2016-04-29 ENCOUNTER — Encounter: Payer: Self-pay | Admitting: Vascular Surgery

## 2016-04-29 DIAGNOSIS — Z23 Encounter for immunization: Secondary | ICD-10-CM

## 2016-05-01 ENCOUNTER — Ambulatory Visit (INDEPENDENT_AMBULATORY_CARE_PROVIDER_SITE_OTHER): Payer: Medicare Other | Admitting: Vascular Surgery

## 2016-05-01 ENCOUNTER — Encounter: Payer: Self-pay | Admitting: Vascular Surgery

## 2016-05-01 VITALS — BP 130/76 | HR 78 | Temp 99.3°F | Resp 20 | Ht 68.0 in | Wt 221.7 lb

## 2016-05-01 DIAGNOSIS — M1711 Unilateral primary osteoarthritis, right knee: Secondary | ICD-10-CM | POA: Diagnosis not present

## 2016-05-01 DIAGNOSIS — N184 Chronic kidney disease, stage 4 (severe): Secondary | ICD-10-CM

## 2016-05-01 DIAGNOSIS — M5106 Intervertebral disc disorders with myelopathy, lumbar region: Secondary | ICD-10-CM | POA: Diagnosis not present

## 2016-05-01 DIAGNOSIS — M1712 Unilateral primary osteoarthritis, left knee: Secondary | ICD-10-CM | POA: Diagnosis not present

## 2016-05-01 NOTE — Progress Notes (Signed)
Patient is a 70 year old male who returns for follow-up today. He ate creation of a left brachiocephalic AV fistula on 07/15/3233. He currently is not on hemodialysis. He states that his last visit with Dr. Posey Pronto at maybe 2 years before he needs dialysis. He currently denies any numbness or tingling in his left hand. He is able to palpate a thrill in his fistula.  Physical exam:  Vitals:   05/01/16 1522  BP: 130/76  Pulse: 78  Resp: 20  Temp: 99.3 F (37.4 C)  TempSrc: Oral  SpO2: 98%  Weight: 221 lb 11.2 oz (100.6 kg)  Height: '5\' 8"'$  (1.727 m)    Left upper extremity: Palpable thrill in fistula the fistula is palpable throughout the upper arm but is probably only about 4 mm in diameter.  Assessment: Maturing AV fistula left arm.  Plan: The patient will follow-up with me in 6 weeks with a duplex of his fistula to see if it is matured at that point. The patient was instructed today and how to exercise the fistula.  Ruta Hinds, MD [facility] Office: [phone] Pager: [phone]

## 2016-05-05 DIAGNOSIS — M25775 Osteophyte, left foot: Secondary | ICD-10-CM | POA: Diagnosis not present

## 2016-05-05 DIAGNOSIS — M25572 Pain in left ankle and joints of left foot: Secondary | ICD-10-CM | POA: Diagnosis not present

## 2016-05-07 ENCOUNTER — Telehealth: Payer: Self-pay

## 2016-05-07 DIAGNOSIS — I10 Essential (primary) hypertension: Secondary | ICD-10-CM

## 2016-05-07 MED ORDER — DOXAZOSIN MESYLATE 8 MG PO TABS: 8.0000 mg | ORAL_TABLET | Freq: Every day | ORAL | 3 refills | Status: DC

## 2016-05-07 MED ORDER — KLOR-CON M20 20 MEQ PO TBCR: 20.0000 meq | EXTENDED_RELEASE_TABLET | Freq: Every day | ORAL | 3 refills | Status: DC

## 2016-05-07 MED ORDER — FUROSEMIDE 80 MG PO TABS: 80.0000 mg | ORAL_TABLET | Freq: Every day | ORAL | 3 refills | Status: DC

## 2016-05-07 NOTE — Telephone Encounter (Signed)
[facility] ref for doxazosin, furosemide and potassium. erx sent.

## 2016-05-12 ENCOUNTER — Telehealth: Payer: Self-pay

## 2016-05-12 DIAGNOSIS — I10 Essential (primary) hypertension: Secondary | ICD-10-CM

## 2016-05-12 MED ORDER — CHLORTHALIDONE 25 MG PO TABS: 25.0000 mg | ORAL_TABLET | Freq: Every day | ORAL | 1 refills | Status: DC

## 2016-05-12 MED ORDER — MYRBETRIQ 50 MG PO TB24: 50.0000 mg | ORAL_TABLET | Freq: Every day | ORAL | 1 refills | Status: DC

## 2016-05-12 MED ORDER — NEBIVOLOL HCL 5 MG PO TABS: 5.0000 mg | ORAL_TABLET | Freq: Every day | ORAL | 1 refills | Status: DC

## 2016-05-12 NOTE — Telephone Encounter (Signed)
[facility] sent rf rq for myrbetriq, bystolic and chlorthalidone.  Erx sent for all.

## 2016-05-13 ENCOUNTER — Encounter: Payer: Self-pay | Admitting: Gastroenterology

## 2016-05-13 ENCOUNTER — Ambulatory Visit: Payer: Medicare Other

## 2016-05-13 ENCOUNTER — Ambulatory Visit (INDEPENDENT_AMBULATORY_CARE_PROVIDER_SITE_OTHER): Payer: Medicare Other | Admitting: Gastroenterology

## 2016-05-13 VITALS — BP 126/60 | HR 80 | Ht 67.0 in | Wt 228.5 lb

## 2016-05-13 DIAGNOSIS — Z85048 Personal history of other malignant neoplasm of rectum, rectosigmoid junction, and anus: Secondary | ICD-10-CM | POA: Diagnosis not present

## 2016-05-13 NOTE — Progress Notes (Signed)
[facility] Progress Note  Chief Complaint: history of rectal cancer  Subjective  History:   Rectal Ca, XRT/CTX surgery -Dr Marcello Moores feb 2015 TA 03/2014 t f/u colonoscopy Single met removed left lung surgically 5/17 He denies abd pain, rectal bleeding , altered BMs or weight loss ROS Cardiovascular:  no chest pain Respiratory: no dyspnea  The patient's Past Medical, Family and Social History were reviewed and are on file in the EMR. CKD - new fistula, but not on HD  Objective:  Med list reviewed  Vital signs in last 24 hrs: Vitals:   05/13/16 1536  BP: 126/60  Pulse: 80    Physical Exam    HEENT: sclera anicteric, oral mucosa moist without lesions  Neck: supple, no thyromegaly, JVD or lymphadenopathy  Cardiac: RRR without murmurs, S1S2 heard, no peripheral edema  Pulm: clear to auscultation bilaterally, normal RR and effort noted  Abdomen: soft, no tenderness, with active bowel sounds. No guarding or palpable hepatosplenomegaly.  Skin; warm and dry, no jaundice or rash AV fistula with palpable thrill LUE  Recent Labs:  03/31/16: K 3.9 Creatinine 4.6 BUN 83 Hgb 11.2  Radiologic studies: PET chest mar 2017 with enhancing left lung lesion    _0 @ Assessment: Encounter Diagnosis  Name Primary?  . Personal history of rectal cancer Yes   It seems that the development of metastatic disease has prompted his oncologist to request surveillance colonoscopy sooner than the usual interval (which would be about a year from now).  He does not have digestive symptoms of recurrent malignancy, so the met may have been lurking since the original Dx.  However, given the circumstances, colonoscopy now appears appropriate.  Plan: Patient agreeable to colonoscopy.  The benefits and risks of the planned procedure were described in detail with the patient or (when appropriate) their health care proxy.  Risks were outlined as including, but not limited to,  bleeding, infection, perforation, adverse medication reaction leading to cardiac or pulmonary decompensation, or pancreatitis (if ERCP).  The limitation of incomplete mucosal visualization was also discussed.  No guarantees or warranties were given.  Moviprep planned b/c iso-osmolar due to CKD    [name]  Cc: Betsy Coder, MD

## 2016-05-13 NOTE — Patient Instructions (Signed)
If you are age 69 or older, your body mass index should be between 23-30. Your Body mass index is 35.79 kg/m. If this is out of the aforementioned range listed, please consider follow up with your Primary Care Provider.  If you are age 7 or younger, your body mass index should be between 19-25. Your Body mass index is 35.79 kg/m. If this is out of the aformentioned range listed, please consider follow up with your Primary Care Provider.   You have been scheduled for a colonoscopy. Please follow written instructions given to you at your visit today.  Please pick up your prep supplies at the pharmacy within the next 1-3 days. If you use inhalers (even only as needed), please bring them with you on the day of your procedure. Your physician has requested that you go to www.startemmi.com and enter the access code given to you at your visit today. This web site gives a general overview about your procedure. However, you should still follow specific instructions given to you by our office regarding your preparation for the procedure.  Thank you for choosing [facility]  Dr Wilfrid Lund III

## 2016-05-14 ENCOUNTER — Encounter: Payer: Self-pay | Admitting: Gastroenterology

## 2016-05-15 ENCOUNTER — Ambulatory Visit: Payer: Medicare Other

## 2016-05-20 DIAGNOSIS — H40033 Anatomical narrow angle, bilateral: Secondary | ICD-10-CM | POA: Diagnosis not present

## 2016-05-20 DIAGNOSIS — H401221 Low-tension glaucoma, left eye, mild stage: Secondary | ICD-10-CM | POA: Diagnosis not present

## 2016-05-20 DIAGNOSIS — H401212 Low-tension glaucoma, right eye, moderate stage: Secondary | ICD-10-CM | POA: Diagnosis not present

## 2016-05-22 ENCOUNTER — Other Ambulatory Visit: Payer: Self-pay | Admitting: *Deleted

## 2016-05-22 MED ORDER — ALLOPURINOL 300 MG PO TABS: ORAL_TABLET | ORAL | 1 refills | Status: DC

## 2016-05-22 NOTE — Telephone Encounter (Signed)
Rec'd call pt states he is needing a rx sent to [facility] on his Allopurinol. Inform pt will send electronically...Jason [name]

## 2016-05-26 ENCOUNTER — Encounter: Payer: Self-pay | Admitting: Gastroenterology

## 2016-05-26 ENCOUNTER — Telehealth: Payer: Self-pay | Admitting: Gastroenterology

## 2016-05-26 ENCOUNTER — Other Ambulatory Visit: Payer: Self-pay | Admitting: *Deleted

## 2016-05-26 ENCOUNTER — Telehealth: Payer: Self-pay | Admitting: *Deleted

## 2016-05-26 DIAGNOSIS — Z4931 Encounter for adequacy testing for hemodialysis: Secondary | ICD-10-CM

## 2016-05-26 DIAGNOSIS — N186 End stage renal disease: Secondary | ICD-10-CM

## 2016-05-26 NOTE — Telephone Encounter (Signed)
Call was transferred from front desk to admitting because patient has questions on what medications he can take the day of procedure.  I looked at his medication list and he was okay to take all of his meds as prescribed.  I advised him he may want to hold his Lasix due to having to get up so much to go to restroom.  He agreed that he would hold it.  All questions were answered.

## 2016-05-26 NOTE — Telephone Encounter (Signed)
Spoke with patient.  Advised him that he will be ok to have procedure tomorrow.  I reviewed prep instructions for today and tomorrow with him and answered all additional questions.  Patient verbalized understanding.

## 2016-05-26 NOTE — Telephone Encounter (Signed)
error 

## 2016-05-27 ENCOUNTER — Encounter: Payer: Self-pay | Admitting: Gastroenterology

## 2016-05-27 ENCOUNTER — Ambulatory Visit (AMBULATORY_SURGERY_CENTER): Payer: Medicare Other | Admitting: Gastroenterology

## 2016-05-27 VITALS — BP 152/78 | HR 78 | Temp 97.1°F | Resp 28 | Ht 67.0 in | Wt 228.0 lb

## 2016-05-27 DIAGNOSIS — Z85048 Personal history of other malignant neoplasm of rectum, rectosigmoid junction, and anus: Secondary | ICD-10-CM | POA: Diagnosis present

## 2016-05-27 DIAGNOSIS — N186 End stage renal disease: Secondary | ICD-10-CM | POA: Diagnosis not present

## 2016-05-27 LAB — HM COLONOSCOPY

## 2016-05-27 MED ORDER — SODIUM CHLORIDE 0.9 % IV SOLN: 500.0000 mL | INTRAVENOUS | Status: DC

## 2016-05-27 NOTE — Progress Notes (Signed)
Report to PACU, RN, vss, BBS= Clear.  

## 2016-05-27 NOTE — Op Note (Signed)
[facility] Patient Name: Jason [name] Procedure Date: 05/27/2016 11:14 AM MRN: [id number] Endoscopist: Mallie Mussel L. Loletha Carrow , MD Age: 70 Referring MD:  Date of Birth: [birth year] Gender: Male Account #: [id number] Procedure:                Colonoscopy Indications:              High risk colon cancer surveillance: Personal                            history of rectal cancer (last colonoscopy found                            subcm TA 03/2014, then patient had resection of left                            lung metastatic lesion in 11/2015). Medicines:                Monitored Anesthesia Care Procedure:                Pre-Anesthesia Assessment:                           - Prior to the procedure, a History and Physical                            was performed, and patient medications and                            allergies were reviewed. The patient's tolerance of                            previous anesthesia was also reviewed. The risks                            and benefits of the procedure and the sedation                            options and risks were discussed with the patient.                            All questions were answered, and informed consent                            was obtained. Prior Anticoagulants: The patient has                            taken no previous anticoagulant or antiplatelet                            agents. ASA Grade Assessment: III - A patient with                            severe systemic disease. After reviewing the risks  and benefits, the patient was deemed in                            satisfactory condition to undergo the procedure.                           After obtaining informed consent, the colonoscope                            was passed under direct vision. Throughout the                            procedure, the patient's blood pressure, pulse, and                            oxygen saturations were  monitored continuously. The                            Model CF-HQ190L [id number]) scope was introduced                            through the anus and advanced to the the cecum,                            identified by appendiceal orifice and ileocecal                            valve. The colonoscopy was performed without                            difficulty. The patient tolerated the procedure                            well. The quality of the bowel preparation was                            excellent. The ileocecal valve, appendiceal                            orifice, and rectum were photographed. The bowel                            preparation used was SUPREP. Scope In: 11:21:53 AM Scope Out: 11:30:01 AM Scope Withdrawal Time: 0 hours 5 minutes 5 seconds  Total Procedure Duration: 0 hours 8 minutes 8 seconds  Findings:                 The perianal and digital rectal examinations were                            normal.                           There was evidence of a prior end-to-end  colo-rectal anastomosis in the distal rectum. This                            was patent and was characterized by healthy                            appearing mucosa. Just distal to that (in the last                            2-3 cm of the rectal mucosa) there was radiation                            proctopathy without friability.                           Retroflexion in the rectum was not performed due to                            post-surgical anatomy.                           The exam was otherwise without abnormality. Complications:            No immediate complications. Estimated Blood Loss:     Estimated blood loss: none. Impression:               - Patent end-to-end colo-rectal anastomosis,                            characterized by healthy appearing mucosa.                           - The examination was otherwise normal.                           - No  specimens collected. Recommendation:           - Patient has a contact number available for                            emergencies. The signs and symptoms of potential                            delayed complications were discussed with the                            patient. Return to normal activities tomorrow.                            Written discharge instructions were provided to the                            patient.                           - Resume previous diet.                           -  Continue present medications.                           - Repeat colonoscopy in 5 years for surveillance. Henry L. Loletha Carrow, MD 05/27/2016 11:41:58 AM This report has been signed electronically. CC Letter to:             Illene Regulus, MD

## 2016-05-27 NOTE — Patient Instructions (Signed)
YOU HAD AN ENDOSCOPIC PROCEDURE TODAY AT [facility]:   Refer to the procedure report that was given to you for any specific questions about what was found during the examination.  If the procedure report does not answer your questions, please call your gastroenterologist to clarify.  If you requested that your care partner not be given the details of your procedure findings, then the procedure report has been included in a sealed envelope for you to review at your convenience later.  YOU SHOULD EXPECT: Some feelings of bloating in the abdomen. Passage of more gas than usual.  Walking can help get rid of the air that was put into your GI tract during the procedure and reduce the bloating. If you had a lower endoscopy (such as a colonoscopy or flexible sigmoidoscopy) you may notice spotting of blood in your stool or on the toilet paper. If you underwent a bowel prep for your procedure, you may not have a normal bowel movement for a few days.  Please Note:  You might notice some irritation and congestion in your nose or some drainage.  This is from the oxygen used during your procedure.  There is no need for concern and it should clear up in a day or so.  SYMPTOMS TO REPORT IMMEDIATELY:   Following lower endoscopy (colonoscopy or flexible sigmoidoscopy):  Excessive amounts of blood in the stool  Significant tenderness or worsening of abdominal pains  Swelling of the abdomen that is new, acute  Fever of 100F or higher  F  For urgent or emergent issues, a gastroenterologist can be reached at any hour by calling [phone].   DIET:  We do recommend a small meal at first, but then you may proceed to your regular diet.  Drink plenty of fluids but you should avoid alcoholic beverages for 24 hours.  ACTIVITY:  You should plan to take it easy for the rest of today and you should NOT DRIVE or use heavy machinery until tomorrow (because of the sedation medicines used during the test).     FOLLOW UP: Our staff will call the number listed on your records the next business day following your procedure to check on you and address any questions or concerns that you may have regarding the information given to you following your procedure. If we do not reach you, we will leave a message.  However, if you are feeling well and you are not experiencing any problems, there is no need to return our call.  We will assume that you have returned to your regular daily activities without incident.  If any biopsies were taken you will be contacted by phone or by letter within the next 1-3 weeks.  Please call us at [phone] if you have not heard about the biopsies in 3 weeks.    SIGNATURES/CONFIDENTIALITY: You and/or your care partner have signed paperwork which will be entered into your electronic medical record.  These signatures attest to the fact that that the information above on your After Visit Summary has been reviewed and is understood.  Full responsibility of the confidentiality of this discharge information lies with you and/or your care-partner.   REPEAT COLONOSCOPY IN 5 YEARS

## 2016-05-28 ENCOUNTER — Encounter: Payer: Self-pay | Admitting: Vascular Surgery

## 2016-05-28 ENCOUNTER — Telehealth: Payer: Self-pay

## 2016-05-28 NOTE — Telephone Encounter (Signed)
  Follow up Call-  Call back number 05/27/2016 03/31/2014  Post procedure Call Back phone  # [phone]  Permission to leave phone message Yes Yes  Some recent data might be hidden    Patient was called for follow up after his procedure on 05/27/2016. I spoke to Jason [name]'s wife and she reports that Jason [name] has returned to his normal daily activities without any complications.

## 2016-05-30 DIAGNOSIS — N189 Chronic kidney disease, unspecified: Secondary | ICD-10-CM | POA: Diagnosis not present

## 2016-05-30 DIAGNOSIS — N2581 Secondary hyperparathyroidism of renal origin: Secondary | ICD-10-CM | POA: Diagnosis not present

## 2016-05-30 DIAGNOSIS — N184 Chronic kidney disease, stage 4 (severe): Secondary | ICD-10-CM | POA: Diagnosis not present

## 2016-05-30 LAB — CBC AND DIFFERENTIAL
HEMATOCRIT: 33 % — AB (ref 41–53)
Hemoglobin: 11.5 g/dL — AB (ref 13.5–17.5)
Platelets: 220 10*3/uL (ref 150–399)

## 2016-05-30 LAB — BASIC METABOLIC PANEL
BUN: 61 mg/dL — AB (ref 4–21)
CREATININE: 4.3 mg/dL — AB (ref 0.6–1.3)
Glucose: 105 mg/dL
POTASSIUM: 3.5 mmol/L (ref 3.4–5.3)
SODIUM: 137 mmol/L (ref 137–147)

## 2016-06-02 ENCOUNTER — Ambulatory Visit (HOSPITAL_COMMUNITY)
Admission: RE | Admit: 2016-06-02 | Discharge: 2016-06-02 | Disposition: A | Discharge status: Home / Self Care | Payer: Medicare Other | Source: Ambulatory Visit | Attending: Surgery | Admitting: Surgery

## 2016-06-02 DIAGNOSIS — Z4931 Encounter for adequacy testing for hemodialysis: Secondary | ICD-10-CM

## 2016-06-02 DIAGNOSIS — N186 End stage renal disease: Secondary | ICD-10-CM | POA: Diagnosis not present

## 2016-06-05 ENCOUNTER — Ambulatory Visit (INDEPENDENT_AMBULATORY_CARE_PROVIDER_SITE_OTHER): Payer: Medicare Other | Admitting: Vascular Surgery

## 2016-06-05 ENCOUNTER — Encounter: Payer: Self-pay | Admitting: Vascular Surgery

## 2016-06-05 VITALS — BP 139/74 | HR 68 | Resp 18 | Ht 67.0 in | Wt 219.0 lb

## 2016-06-05 DIAGNOSIS — N184 Chronic kidney disease, stage 4 (severe): Secondary | ICD-10-CM

## 2016-06-05 NOTE — Progress Notes (Signed)
  POST OPERATIVE OFFICE NOTE    CC:  F/u for surgery  HPI:  This is a 70 y.o. male who is s/p Left Brachial Cephalic fistula creation.  He is currently not on HD.  He reports no symptoms of steal.    Allergies  Allergen Reactions  . Nsaids Other (See Comments)    Asked by surgeon to add this medication class as intolerance due to patients renal insufficiency.  . Amlodipine Other (See Comments)    Edema     Current Outpatient Prescriptions  Medication Sig Dispense Refill  . acetaminophen (TYLENOL) 500 MG tablet Take 1,000 mg by mouth every 6 (six) hours as needed for moderate pain or headache.    . allopurinol (ZYLOPRIM) 300 MG tablet Take 300 mg by mouth once daily 90 tablet 1  . chlorthalidone (HYGROTON) 25 MG tablet Take 1 tablet (25 mg total) by mouth daily. 90 tablet 1  . doxazosin (CARDURA) 8 MG tablet Take 1 tablet (8 mg total) by mouth at bedtime. 90 tablet 3  . furosemide (LASIX) 80 MG tablet Take 1 tablet (80 mg total) by mouth daily. 90 tablet 3  . gabapentin (NEURONTIN) 300 MG capsule Take 300 mg by mouth 3 (three) times daily. Take '300mg'$ s morning and '600mg'$ s at bedtime    . KLOR-CON M20 20 MEQ tablet Take 1 tablet (20 mEq total) by mouth daily. 90 tablet 3  . loperamide (IMODIUM) 2 MG capsule Take 2 mg by mouth as needed for diarrhea or loose stools.    . meloxicam (MOBIC) 7.5 MG tablet Take 7.5 mg by mouth 2 (two) times daily.     Marland Kitchen MYRBETRIQ 50 MG TB24 tablet Take 1 tablet (50 mg total) by mouth daily. 90 tablet 1  . nebivolol (BYSTOLIC) 5 MG tablet Take 1 tablet (5 mg total) by mouth daily. 90 tablet 1  . polycarbophil (FIBERCON) 625 MG tablet Take 1,250 mg by mouth daily.    Marland Kitchen RAYALDEE 30 MCG CPCR Take 1 tablet by mouth daily.     . Vitamin D, Ergocalciferol, (DRISDOL) 50000 UNITS CAPS capsule Take 50,000 Units by mouth every Sunday.      Current Facility-Administered Medications  Medication Dose Route Frequency Provider Last Rate Last Dose  . 0.9 %  sodium chloride  infusion  500 mL Intravenous Continuous [name], MD         ROS:  See HPI  Physical Exam:  Vitals:   06/05/16 1507  BP: 139/74  Pulse: 68  Resp: 18   Fistula duplex: >0.8 diameter and < 0.3 MM depth from the skin  Incision:  Well healed Extremities:  Palpable left AV fistula with good thrill Neuro: sensation intact with pal able radial pulse  Assessment/Plan:  This is a 70 y.o. male who is s/p:  Left Brachial Cephalic fistula creation.  Once he has been 3 months from surgery on 03/24/2016 they may stick the fistula if he needs to start HD.  He will f/u PRN    Roxy Horseman PA-C [facility] [phone]  Clinic MD:  Pt seen and examined with Dr. Oneida Alar  I agree with above fistula is maturing well should be ready for cannulation soon   Ruta Hinds, MD [facility] [name]: [phone] Pager: [phone]

## 2016-06-09 DIAGNOSIS — D631 Anemia in chronic kidney disease: Secondary | ICD-10-CM | POA: Diagnosis not present

## 2016-06-09 DIAGNOSIS — N2581 Secondary hyperparathyroidism of renal origin: Secondary | ICD-10-CM | POA: Diagnosis not present

## 2016-06-09 DIAGNOSIS — N185 Chronic kidney disease, stage 5: Secondary | ICD-10-CM | POA: Diagnosis not present

## 2016-06-09 DIAGNOSIS — I10 Essential (primary) hypertension: Secondary | ICD-10-CM | POA: Diagnosis not present

## 2016-06-11 ENCOUNTER — Encounter: Payer: Self-pay | Admitting: Internal Medicine

## 2016-06-11 ENCOUNTER — Ambulatory Visit (INDEPENDENT_AMBULATORY_CARE_PROVIDER_SITE_OTHER): Payer: Medicare Other | Admitting: Internal Medicine

## 2016-06-11 VITALS — BP 150/70 | HR 69 | Temp 98.2°F | Resp 16 | Ht 67.0 in | Wt 218.5 lb

## 2016-06-11 DIAGNOSIS — N3281 Overactive bladder: Secondary | ICD-10-CM

## 2016-06-11 DIAGNOSIS — I10 Essential (primary) hypertension: Secondary | ICD-10-CM

## 2016-06-11 NOTE — Progress Notes (Signed)
Subjective:  Patient ID: Jason Cotta Sr., male    DOB: Jan 14, 1946  Age: 70 y.o. MRN: [id number]  CC: Hypertension   HPI Jason GAGO Sr. presents for a BP check. He complains of urinary frequency and urinary incontinence.  Outpatient Medications Prior to Visit  Medication Sig Dispense Refill  . acetaminophen (TYLENOL) 500 MG tablet Take 1,000 mg by mouth every 6 (six) hours as needed for moderate pain or headache.    . allopurinol (ZYLOPRIM) 300 MG tablet Take 300 mg by mouth once daily 90 tablet 1  . chlorthalidone (HYGROTON) 25 MG tablet Take 1 tablet (25 mg total) by mouth daily. 90 tablet 1  . doxazosin (CARDURA) 8 MG tablet Take 1 tablet (8 mg total) by mouth at bedtime. 90 tablet 3  . furosemide (LASIX) 80 MG tablet Take 1 tablet (80 mg total) by mouth daily. 90 tablet 3  . gabapentin (NEURONTIN) 300 MG capsule Take 300 mg by mouth 3 (three) times daily. Take '300mg'$ s morning and '600mg'$ s at bedtime    . KLOR-CON M20 20 MEQ tablet Take 1 tablet (20 mEq total) by mouth daily. 90 tablet 3  . loperamide (IMODIUM) 2 MG capsule Take 2 mg by mouth as needed for diarrhea or loose stools.    Marland Kitchen MYRBETRIQ 50 MG TB24 tablet Take 1 tablet (50 mg total) by mouth daily. 90 tablet 1  . nebivolol (BYSTOLIC) 5 MG tablet Take 1 tablet (5 mg total) by mouth daily. 90 tablet 1  . polycarbophil (FIBERCON) 625 MG tablet Take 1,250 mg by mouth daily.    . meloxicam (MOBIC) 7.5 MG tablet Take 7.5 mg by mouth 2 (two) times daily.     Marland Kitchen RAYALDEE 30 MCG CPCR Take 1 tablet by mouth daily.     . Vitamin D, Ergocalciferol, (DRISDOL) 50000 UNITS CAPS capsule Take 50,000 Units by mouth every Sunday.     Marland Kitchen 0.9 %  sodium chloride infusion      No facility-administered medications prior to visit.     ROS Review of Systems  Constitutional: Negative for activity change, chills, diaphoresis, fatigue and unexpected weight change.  HENT: Negative.   Eyes: Negative for visual disturbance.  Respiratory: Negative for  cough, chest tightness, shortness of breath and stridor.   Cardiovascular: Negative for chest pain, palpitations and leg swelling.  Gastrointestinal: Negative for abdominal pain, blood in stool, constipation, diarrhea, nausea and vomiting.  Endocrine: Negative.   Genitourinary: Positive for enuresis and frequency. Negative for decreased urine volume, dysuria, flank pain, hematuria and urgency.  Musculoskeletal: Negative.  Negative for back pain and myalgias.  Skin: Negative.   Allergic/Immunologic: Negative.   Neurological: Negative for dizziness.  Hematological: Negative.  Negative for adenopathy. Does not bruise/bleed easily.  Psychiatric/Behavioral: Negative.     Objective:  BP (!) 150/70 (BP Location: Left Arm, Patient Position: Sitting, Cuff Size: Normal)   Pulse 69   Temp 98.2 F (36.8 C) (Oral)   Resp 16   Ht '5\' 7"'$  (1.702 m)   Wt 218 lb 8 oz (99.1 kg)   SpO2 98%   BMI 34.22 kg/m   BP Readings from Last 3 Encounters:  06/11/16 (!) 150/70  06/05/16 139/74  05/27/16 (!) 152/78    Wt Readings from Last 3 Encounters:  06/11/16 218 lb 8 oz (99.1 kg)  06/05/16 219 lb (99.3 kg)  05/27/16 228 lb (103.4 kg)    Physical Exam  Constitutional: He is oriented to person, place, and time. No distress.  HENT:  Mouth/Throat: Oropharynx is clear and moist.  Eyes: Conjunctivae are normal. Right eye exhibits no discharge. Left eye exhibits no discharge. No scleral icterus.  Neck: Normal range of motion. Neck supple. No JVD present. No tracheal deviation present. No thyromegaly present.  Cardiovascular: Normal rate, regular rhythm, normal heart sounds and intact distal pulses.  Exam reveals no gallop and no friction rub.   No murmur heard. Pulmonary/Chest: Effort normal and breath sounds normal. No stridor. No respiratory distress. He has no wheezes. He has no rales. He exhibits no tenderness.  Abdominal: Soft. Bowel sounds are normal. He exhibits no distension and no mass. There is no  tenderness. There is no rebound and no guarding.  Musculoskeletal: Normal range of motion. He exhibits no edema, tenderness or deformity.  Lymphadenopathy:    He has no cervical adenopathy.  Neurological: He is oriented to person, place, and time.  Skin: Skin is warm and dry. No rash noted. He is not diaphoretic. No erythema. No pallor.  Vitals reviewed.   Lab Results  Component Value Date   WBC 6.6 03/31/2016   HGB 11.5 (A) 05/30/2016   HCT 33 (A) 05/30/2016   PLT 220 05/30/2016   GLUCOSE 110 (H) 03/24/2016   CHOL 208 (H) 06/04/2015   TRIG 203.0 (H) 06/04/2015   HDL 34.80 (L) 06/04/2015   LDLDIRECT 120.0 06/04/2015   LDLCALC 131 (H) 05/29/2010   ALT 12 (L) 11/22/2015   AST 21 11/22/2015   NA 137 05/30/2016   K 3.5 05/30/2016   CL 97 01/09/2016   CREATININE 4.3 (A) 05/30/2016   BUN 61 (A) 05/30/2016   CO2 29 01/09/2016   TSH 1.90 08/22/2015   PSA 1.86 08/10/2013   INR 1.11 11/16/2015   HGBA1C 6.0 12/20/2009    No results found.  Assessment & Plan:   Jason [name] was seen today for hypertension.  Diagnoses and all orders for this visit:  OAB (overactive bladder)- will refer to urology to see if there are any treatment options -     Ambulatory referral to Urology  Essential hypertension- His blood pressure is well controlled   I have discontinued Jason [name] Vitamin D (Ergocalciferol), RAYALDEE, and meloxicam. I am also having him maintain his gabapentin, acetaminophen, KLOR-CON M20, doxazosin, furosemide, chlorthalidone, nebivolol, MYRBETRIQ, loperamide, polycarbophil, and allopurinol. We will stop administering sodium chloride.  No orders of the defined types were placed in this encounter.    Follow-up: Return in about 6 months (around 12/10/2016).  [name], MD

## 2016-06-11 NOTE — Patient Instructions (Signed)
Hypertension Hypertension, commonly called high blood pressure, is when the force of blood pumping through your arteries is too strong. Your arteries are the blood vessels that carry blood from your heart throughout your body. A blood pressure reading consists of a higher number over a lower number, such as 110/72. The higher number (systolic) is the pressure inside your arteries when your heart pumps. The lower number (diastolic) is the pressure inside your arteries when your heart relaxes. Ideally you want your blood pressure below 120/80. Hypertension forces your heart to work harder to pump blood. Your arteries may become narrow or stiff. Having untreated or uncontrolled hypertension can cause heart attack, stroke, kidney disease, and other problems. What increases the risk? Some risk factors for high blood pressure are controllable. Others are not. Risk factors you cannot control include:  Race. You may be at higher risk if you are African American.  Age. Risk increases with age.  Gender. Men are at higher risk than women before age 45 years. After age 65, women are at higher risk than men. Risk factors you can control include:  Not getting enough exercise or physical activity.  Being overweight.  Getting too much fat, sugar, calories, or salt in your diet.  Drinking too much alcohol. What are the signs or symptoms? Hypertension does not usually cause signs or symptoms. Extremely high blood pressure (hypertensive crisis) may cause headache, anxiety, shortness of breath, and nosebleed. How is this diagnosed? To check if you have hypertension, your health care provider will measure your blood pressure while you are seated, with your arm held at the level of your heart. It should be measured at least twice using the same arm. Certain conditions can cause a difference in blood pressure between your right and left arms. A blood pressure reading that is higher than normal on one occasion does  not mean that you need treatment. If it is not clear whether you have high blood pressure, you may be asked to return on a different day to have your blood pressure checked again. Or, you may be asked to monitor your blood pressure at home for 1 or more weeks. How is this treated? Treating high blood pressure includes making lifestyle changes and possibly taking medicine. Living a healthy lifestyle can help lower high blood pressure. You may need to change some of your habits. Lifestyle changes may include:  Following the DASH diet. This diet is high in fruits, vegetables, and whole grains. It is low in salt, red meat, and added sugars.  Keep your sodium intake below 2,300 mg per day.  Getting at least 30-45 minutes of aerobic exercise at least 4 times per week.  Losing weight if necessary.  Not smoking.  Limiting alcoholic beverages.  Learning ways to reduce stress. Your health care provider may prescribe medicine if lifestyle changes are not enough to get your blood pressure under control, and if one of the following is true:  You are 18-59 years of age and your systolic blood pressure is above 140.  You are 60 years of age or older, and your systolic blood pressure is above 150.  Your diastolic blood pressure is above 90.  You have diabetes, and your systolic blood pressure is over 140 or your diastolic blood pressure is over 90.  You have kidney disease and your blood pressure is above 140/90.  You have heart disease and your blood pressure is above 140/90. Your personal target blood pressure may vary depending on your medical   conditions, your age, and other factors. Follow these instructions at home:  Have your blood pressure rechecked as directed by your health care provider.  Take medicines only as directed by your health care provider. Follow the directions carefully. Blood pressure medicines must be taken as prescribed. The medicine does not work as well when you skip  doses. Skipping doses also puts you at risk for problems.  Do not smoke.  Monitor your blood pressure at home as directed by your health care provider. Contact a health care provider if:  You think you are having a reaction to medicines taken.  You have recurrent headaches or feel dizzy.  You have swelling in your ankles.  You have trouble with your vision. Get help right away if:  You develop a severe headache or confusion.  You have unusual weakness, numbness, or feel faint.  You have severe chest or abdominal pain.  You vomit repeatedly.  You have trouble breathing. This information is not intended to replace advice given to you by your health care provider. Make sure you discuss any questions you have with your health care provider. Document Released: 06/23/2005 Document Revised: 11/29/2015 Document Reviewed: 04/15/2013 Elsevier Interactive Patient Education  2017 [employer] Inc.  

## 2016-06-11 NOTE — Progress Notes (Signed)
Pre visit review using our clinic review tool, if applicable. No additional management support is needed unless otherwise documented below in the visit note. 

## 2016-06-12 ENCOUNTER — Encounter: Payer: Self-pay | Admitting: Internal Medicine

## 2016-06-12 DIAGNOSIS — G4733 Obstructive sleep apnea (adult) (pediatric): Secondary | ICD-10-CM | POA: Diagnosis not present

## 2016-06-12 NOTE — Progress Notes (Signed)
Labs abstracted and sent to scan.

## 2016-06-18 ENCOUNTER — Telehealth: Payer: Self-pay

## 2016-06-18 ENCOUNTER — Other Ambulatory Visit: Payer: Self-pay | Admitting: Internal Medicine

## 2016-06-18 DIAGNOSIS — I10 Essential (primary) hypertension: Secondary | ICD-10-CM

## 2016-06-18 MED ORDER — CHLORTHALIDONE 25 MG PO TABS: 25.0000 mg | ORAL_TABLET | Freq: Every day | ORAL | 1 refills | Status: DC

## 2016-06-18 NOTE — Telephone Encounter (Signed)
Pt states that he needs the chlorthalidone sent to [facility] instead of [facility] due to pricing.   Please advise.

## 2016-06-18 NOTE — Telephone Encounter (Signed)
done

## 2016-06-24 ENCOUNTER — Encounter: Payer: Self-pay | Admitting: Internal Medicine

## 2016-06-25 DIAGNOSIS — G4733 Obstructive sleep apnea (adult) (pediatric): Secondary | ICD-10-CM | POA: Diagnosis not present

## 2016-07-14 ENCOUNTER — Encounter: Payer: Self-pay | Admitting: Pulmonary Disease

## 2016-07-15 ENCOUNTER — Ambulatory Visit (INDEPENDENT_AMBULATORY_CARE_PROVIDER_SITE_OTHER): Payer: Medicare Other | Admitting: Pulmonary Disease

## 2016-07-15 ENCOUNTER — Encounter: Payer: Self-pay | Admitting: Pulmonary Disease

## 2016-07-15 DIAGNOSIS — G4733 Obstructive sleep apnea (adult) (pediatric): Secondary | ICD-10-CM

## 2016-07-15 NOTE — Assessment & Plan Note (Addendum)
Has hypersomnia, snoring, fatigue, difficult to control blood pressure, witnessed apneas, frequent awakenings.  He sleeps around 5-6 hours per night. Naps almost daily. Hypersomnia affects functionality. HST 11/2015  AHI 54.  Autocpap 5-15.  DL x 1 month initially AHI 16, 100% complaince. Rpt DL in 07/2016 > 100%, AHI 2. AHI related to leak. Has leak issues which have improved.  Mask sometimes cause facial abrasion.   Feels better with cpap. More energy. Less sleepiness. Feels benefit of cpap.   Plan :  We extensively discussed the importance of treating OSA and the need to use PAP therapy.   Continue with autocpap 5-15 cm water for now. OSA corrected.   He was wondering if he can get a new mask > current FFM causing skin irritation.  Told him to let us know when he will be eligible for a new mask. He just recently got a new mask.  May try nasal mask.      Patient was instructed to have mask, tubings, filter, reservoir cleaned at least once a week with soapy water.  Patient was instructed to call the office if he/she is having issues with the PAP device.    I advised patient to obtain sufficient amount of sleep --  7 to 8 hours at least in a 24 hr period.  Patient was advised to follow good sleep hygiene.  Patient was advised NOT to engage in activities requiring concentration and/or vigilance if he/she is and  sleepy.  Patient is NOT to drive if he/she is sleepy.

## 2016-07-15 NOTE — Progress Notes (Signed)
Subjective:    Patient ID: Jason Cotta Sr., male    DOB: [birth year], 71 y.o.   MRN: [id number]  HPI   This is the case of Jason MCCLENTON Sr., 71 y.o. Male, who was referred by Dr. [name] in consultation regarding possible OSA.   As you very well know, patient has a 15 PY smoking history, quit 15 yrs ago.  Not beed dxed with asthma or copd.  Had colon CA in 2014. Had surgery, chemo, radiation. Is in remission.  Recent chest ct scan with enalrged LUL medial nodule. PET scan shows hypermetabolic LUL nodule. He is scheduled to see his Oncologist this Friday.  Anyway, pt is here for possible OSA.  Has hypersomnia, snoring, fatigue, difficult to control blood pressure, witnessed apneas, frequent awakenings.  He sleeps around 5-6 hours per night. Naps almost daily. Hypersomnia affects functionality. It has been difficult to control his blood pressure.   [name]  01/15/16 Patient returns to the office as follow-up on his sleep apnea. Since last seen, he had a home sleep study which showed AHI of 54. He is tolerating CPAP well. Feels better using it. More energy. Less sleepiness. Initial download showed compliance of 100% with AHI of 16. Recent download showed the same compliance but AHI better 12. He has mask leak issues. Does not use humidifier.   [name] 07/15/2016 Patient returns to the office as follow-up on sleep apnea. Since last seen, he uses his CPAP. Feels better using it. More energy. Feels benefit of CPAP. Download the last month: 100%, AHI 1.9. Auto CPAP 5-15. He had lung surgery last year to remove lung met from rectal CA.    Review of Systems  Constitutional: Negative.  Negative for fever and unexpected weight change.       Gained 5 lbs x 1 month  HENT: Negative.  Negative for congestion, dental problem, ear pain, nosebleeds, postnasal drip, rhinorrhea, sinus pressure, sneezing, sore throat and trouble swallowing.   Eyes: Negative.  Negative for redness and itching.  Respiratory:  Positive for shortness of breath. Negative for cough, chest tightness and wheezing.   Cardiovascular: Negative.  Negative for palpitations and leg swelling.  Gastrointestinal: Negative.  Negative for nausea and vomiting.  Endocrine: Negative.   Genitourinary: Negative.  Negative for dysuria.  Musculoskeletal: Negative.  Negative for joint swelling.  Skin: Negative.  Negative for rash.  Allergic/Immunologic: Negative.   Neurological: Negative.  Negative for headaches.  Hematological: Negative.  Does not bruise/bleed easily.  Psychiatric/Behavioral: Negative.  Negative for dysphoric mood. The patient is not nervous/anxious.   All other systems reviewed and are negative.      Objective:   Physical Exam  Vitals:  Vitals:   07/15/16 1622  BP: 132/70  Pulse: 82  SpO2: 96%  Weight: 216 lb 3.2 oz (98.1 kg)  Height: 5' 8" (1.727 m)    Constitutional/General:  Pleasant, well-nourished, well-developed, not in any distress,  Comfortably seating.  Well kempt morbidly obese.   Body mass index is 32.87 kg/m. Wt Readings from Last 3 Encounters:  07/15/16 216 lb 3.2 oz (98.1 kg)  06/11/16 218 lb 8 oz (99.1 kg)  06/05/16 219 lb (99.3 kg)    Neck circumference: 17 in  HEENT: Pupils equal and reactive to light and accommodation. Anicteric sclerae. Normal nasal mucosa.   No oral  lesions,  mouth clear,  oropharynx clear, no postnasal drip. (-) Oral thrush. No dental caries.  Airway - Mallampati class IV  Neck: No masses.  Midline trachea. No JVD, (-) LAD. (-) bruits appreciated.  Respiratory/Chest: Grossly normal chest. (-) deformity. (-) Accessory muscle use.  Symmetric expansion. (-) Tenderness on palpation.  Resonant on percussion.  Diminished BS on both lower lung zones. (-) wheezing, crackles, rhonchi (-) egophony  Cardiovascular: Regular rate and  rhythm, heart sounds normal, no murmur or gallops, no peripheral edema  Gastrointestinal:  Normal bowel sounds. Soft, non-tender.  No hepatosplenomegaly.  (-) masses.   Musculoskeletal:  Normal muscle tone. Normal gait.   Extremities: Grossly normal. (-) clubbing, cyanosis.  (-) edema  Skin: (-) rash,lesions seen.   Neurological/Psychiatric : alert, oriented to time, place, person. Normal mood and affect            Assessment & Plan:  Obstructive sleep apnea Has hypersomnia, snoring, fatigue, difficult to control blood pressure, witnessed apneas, frequent awakenings.  He sleeps around 5-6 hours per night. Naps almost daily. Hypersomnia affects functionality. HST 11/2015  AHI 54.  Autocpap 5-15.  DL x 1 month initially AHI 16, 100% complaince. Rpt DL in 07/2016 > 100%, AHI 2. AHI related to leak. Has leak issues which have improved.  Mask sometimes cause facial abrasion.   Feels better with cpap. More energy. Less sleepiness. Feels benefit of cpap.   Plan :  We extensively discussed the importance of treating OSA and the need to use PAP therapy.   Continue with autocpap 5-15 cm water for now. OSA corrected.   He was wondering if he can get a new mask > current FFM causing skin irritation.  Told him to let us know when he will be eligible for a new mask. He just recently got a new mask.  May try nasal mask.      Patient was instructed to have mask, tubings, filter, reservoir cleaned at least once a week with soapy water.  Patient was instructed to call the office if he/she is having issues with the PAP device.    I advised patient to obtain sufficient amount of sleep --  7 to 8 hours at least in a 24 hr period.  Patient was advised to follow good sleep hygiene.  Patient was advised NOT to engage in activities requiring concentration and/or vigilance if he/she is and  sleepy.  Patient is NOT to drive if he/she is sleepy.   Patient will follow up with me in 12 mos.     Monica Becton, MD 07/15/2016   4:47 PM [facility] [place] Pager: [phone] Office: [phone], Fax: [phone]

## 2016-07-15 NOTE — Patient Instructions (Signed)
  It was a pleasure taking care of you today!  Continue using your CPAP machine.   Please make sure you use your CPAP device everytime you sleep.  We will monitor the usage of your machine per your insurance requirement.  Your insurance company may take the machine from you if you are not using it regularly.   Please clean the mask, tubings, filter, water reservoir with soapy water every week.  Please use distilled water for the water reservoir.   Please call the office or your machine provider (DME company) if you are having issues with the device.   Return to clinic in 1 year  with Dr. De Dios    

## 2016-07-17 ENCOUNTER — Telehealth: Payer: Self-pay | Admitting: Oncology

## 2016-07-17 NOTE — Telephone Encounter (Signed)
sw pt to confirm r/s 1/15 appt date/time to 1/22 with LT due to MD being on call and not availability

## 2016-07-21 ENCOUNTER — Ambulatory Visit: Payer: Medicare Other | Admitting: Oncology

## 2016-07-21 ENCOUNTER — Other Ambulatory Visit: Payer: Medicare Other

## 2016-07-28 ENCOUNTER — Ambulatory Visit (HOSPITAL_BASED_OUTPATIENT_CLINIC_OR_DEPARTMENT_OTHER): Payer: Medicare Other | Admitting: Nurse Practitioner

## 2016-07-28 ENCOUNTER — Telehealth: Payer: Self-pay | Admitting: Oncology

## 2016-07-28 ENCOUNTER — Other Ambulatory Visit (HOSPITAL_BASED_OUTPATIENT_CLINIC_OR_DEPARTMENT_OTHER): Payer: Medicare Other

## 2016-07-28 VITALS — BP 134/61 | HR 70 | Temp 98.3°F | Resp 18 | Ht 68.0 in | Wt 214.9 lb

## 2016-07-28 DIAGNOSIS — Z85048 Personal history of other malignant neoplasm of rectum, rectosigmoid junction, and anus: Secondary | ICD-10-CM

## 2016-07-28 DIAGNOSIS — C20 Malignant neoplasm of rectum: Secondary | ICD-10-CM

## 2016-07-28 DIAGNOSIS — K5229 Other allergic and dietetic gastroenteritis and colitis: Secondary | ICD-10-CM | POA: Diagnosis not present

## 2016-07-28 NOTE — Progress Notes (Addendum)
[place] OFFICE PROGRESS NOTE   Diagnosis:  Rectal cancer  INTERVAL HISTORY:   Jason [name] returns as scheduled. He feels well. He had some loose stools over the weekend. Bowels otherwise have been moving normally. No abdominal pain. No nausea or vomiting. No shortness of breath. He reports a good appetite. He has lost weight since his last visit. He attributes this to riding a stationary bike daily.   Objective:  Vital signs in last 24 hours:  Blood pressure 134/61, pulse 70, temperature 98.3 F (36.8 C), temperature source Oral, resp. rate 18, height '5\' 8"'$  (1.727 m), weight 214 lb 14.4 oz (97.5 kg), SpO2 100 %.    HEENT: No neck mass. Lymphatics: No palpable cervical, supraclavicular, axillary or inguinal lymph nodes. Resp: Lungs clear bilaterally. Cardio: Regular rate and rhythm. GI: Abdomen soft and nontender. No hepatomegaly. Vascular: No leg edema.  Lab Results:  Lab Results  Component Value Date   WBC 6.6 03/31/2016   HGB 11.5 (A) 05/30/2016   HCT 33 (A) 05/30/2016   MCV 87.9 03/17/2016   PLT 220 05/30/2016   NEUTROABS 5.4 03/17/2016    Imaging:  No results found.  Medications: I have reviewed the patient's current medications.  Assessment/Plan: 1. Rectal cancer, clinical stage III (uT3, uN2).  Initiation of concurrent Xeloda and radiation on 02/21/2013, completed 03/31/2013.   CEA normal 04/25/2013   Status post laparoscopic low anterior resection with diverting loop ileostomy 05/23/2013. Final pathology showed a 3.2 cm invasive adenocarcinoma with neoadjuvant related change. Tumor invaded into perirectal soft tissue. There was no lymphovascular or perineural invasion. 14 lymph nodes were negative for tumor. Surgical margins were negative. There was one hyperplastic polyp (ypT3, pN0).   Initiation of weekly 5-FU/leucovorin 08/01/2013   Ileostomy takedown 08/24/2013.   Weekly 5-FU/leucovorin resumed 09/22/2013.  Restaging CTs  02/20/2014 negative for evidence of recurrent disease  restaging CT scans 04/05/2015 negative for evidence of recurrent disease  Surveillance colonoscopy 03/31/2014, status post removal of of a tubular adenoma from the ascending colon  Enlarging left posterior medial upper lobe nodule on chest CT 04/05/2015 and 07/12/2015  PET scan 10/22/2015 with a hypermetabolic nodule in the medial laxative upper lobe, other lung nodules stable  Left lung wedge resection 11/20/2015 confirmed a 1 cm focus of metastatic adenocarcinoma of colorectal origin, resection margins and multiple lymph nodes negative  CT scans 03/17/2016-bandlike scarring along the left upper lobe nodule resection with a soft tissue density component; calcified bilateral pleural plaques; no findings of recurrence of the abdomen or pelvis.  2. Markedly elevated pretreatment CEA 3. Indeterminate pulmonary/subpleural nodules and right hepatic dome lesion. Unchanged on the CT 04/25/2013, unchanged nodular lung lesions on the CT 02/20/2014, see follow-up CTs/PET above 4. Renal insufficiency. Acute on chronic renal failure during hospitalization November/December 2014. Improved following the ileostomy takedown. 5. History of Anemia secondary chemotherapy, chronic disease, and renal insufficiency 6. Hypertension. 7. Gout. 8. Prolonged postoperative ileus following laparoscopic low anterior resection with diverting loop ileostomy 05/23/2013. 9. C. difficile-positive 09/08/2013-status post Flagyl with improvement. Recurrent diarrhea following completion of Flagyl. Stool positive for C. difficile on 09/24/2013. He completed treatment with vancomycin. The diarrhea resolved. 10. Left greater than right low leg edema. Venous Doppler 09/26/2013 negative for left leg DVT. 11. Rectal urgency/incontinence-Improved with participation in the pelvic physical therapy clinic.   Disposition: Jason [name] remains in clinical remission from rectal cancer. We  will follow-up on the CEA from today. We are referring him for restaging CT scans in  March which will be at a six-month interval from the previous. He will return for a follow-up visit and CEA in 6 months. He will contact the office in the interim with any problems.  Patient seen with Dr. Benay Spice.    Ned Card ANP/GNP-BC   07/28/2016  4:09 PM  This was a shared visit with Ned Card. We will follow-up on the CEA from today. Jason [name] will be scheduled for restaging CT scans in March. He will return for an office visit in 6 months.  Julieanne Manson, M.D.

## 2016-07-28 NOTE — Telephone Encounter (Signed)
Appointments scheduled per 07/28/16 los. Patient was given a copy of the AVS report and appointment schedule, per 07/28/16 los.

## 2016-07-29 LAB — CEA (IN HOUSE-CHCC): CEA (CHCC-In House): 1.23 ng/mL (ref 0.00–5.00)

## 2016-07-29 LAB — CEA: CEA: 1.8 ng/mL (ref 0.0–4.7)

## 2016-07-30 ENCOUNTER — Telehealth: Payer: Self-pay

## 2016-07-30 DIAGNOSIS — N189 Chronic kidney disease, unspecified: Secondary | ICD-10-CM | POA: Diagnosis not present

## 2016-07-30 DIAGNOSIS — N2581 Secondary hyperparathyroidism of renal origin: Secondary | ICD-10-CM | POA: Diagnosis not present

## 2016-07-30 DIAGNOSIS — N185 Chronic kidney disease, stage 5: Secondary | ICD-10-CM | POA: Diagnosis not present

## 2016-07-30 NOTE — Telephone Encounter (Signed)
Left message for pt to call back regarding lab results. 

## 2016-07-30 NOTE — Telephone Encounter (Signed)
Pt called back, informed of normal cea results. Pt verbalized understanding and denies any questions or concerns at this time

## 2016-07-30 NOTE — Telephone Encounter (Signed)
-----   Message from Owens Shark, NP sent at 07/30/2016  8:40 AM EST -----  Please let him know CEA is normal.  ----- Message ----- From: Interface, Lab In Three Zero One Sent: 07/29/2016  10:51 AM To: Owens Shark, NP

## 2016-08-13 DIAGNOSIS — I10 Essential (primary) hypertension: Secondary | ICD-10-CM | POA: Diagnosis not present

## 2016-08-13 DIAGNOSIS — N185 Chronic kidney disease, stage 5: Secondary | ICD-10-CM | POA: Diagnosis not present

## 2016-08-13 DIAGNOSIS — D631 Anemia in chronic kidney disease: Secondary | ICD-10-CM | POA: Diagnosis not present

## 2016-08-13 DIAGNOSIS — N2581 Secondary hyperparathyroidism of renal origin: Secondary | ICD-10-CM | POA: Diagnosis not present

## 2016-08-18 DIAGNOSIS — N170 Acute kidney failure with tubular necrosis: Secondary | ICD-10-CM | POA: Diagnosis not present

## 2016-08-18 DIAGNOSIS — R278 Other lack of coordination: Secondary | ICD-10-CM | POA: Diagnosis not present

## 2016-08-18 DIAGNOSIS — M6281 Muscle weakness (generalized): Secondary | ICD-10-CM | POA: Diagnosis not present

## 2016-08-21 ENCOUNTER — Other Ambulatory Visit: Payer: Self-pay | Admitting: Internal Medicine

## 2016-08-28 DIAGNOSIS — M6281 Muscle weakness (generalized): Secondary | ICD-10-CM | POA: Diagnosis not present

## 2016-09-10 DIAGNOSIS — M6281 Muscle weakness (generalized): Secondary | ICD-10-CM | POA: Diagnosis not present

## 2016-09-12 ENCOUNTER — Telehealth: Payer: Self-pay | Admitting: Oncology

## 2016-09-12 NOTE — Telephone Encounter (Signed)
Called and confirmed appointment change with the patient

## 2016-09-29 ENCOUNTER — Other Ambulatory Visit: Payer: Self-pay | Admitting: Internal Medicine

## 2016-09-29 DIAGNOSIS — I10 Essential (primary) hypertension: Secondary | ICD-10-CM

## 2016-09-30 ENCOUNTER — Ambulatory Visit (HOSPITAL_COMMUNITY)
Admission: RE | Admit: 2016-09-30 | Discharge: 2016-09-30 | Disposition: A | Discharge status: Home / Self Care | Payer: Medicare Other | Source: Ambulatory Visit | Attending: Nurse Practitioner | Admitting: Nurse Practitioner

## 2016-09-30 DIAGNOSIS — C20 Malignant neoplasm of rectum: Secondary | ICD-10-CM | POA: Diagnosis not present

## 2016-09-30 DIAGNOSIS — N281 Cyst of kidney, acquired: Secondary | ICD-10-CM | POA: Insufficient documentation

## 2016-09-30 DIAGNOSIS — K5730 Diverticulosis of large intestine without perforation or abscess without bleeding: Secondary | ICD-10-CM | POA: Diagnosis not present

## 2016-09-30 DIAGNOSIS — I700 Atherosclerosis of aorta: Secondary | ICD-10-CM | POA: Insufficient documentation

## 2016-09-30 DIAGNOSIS — J948 Other specified pleural conditions: Secondary | ICD-10-CM | POA: Diagnosis not present

## 2016-09-30 DIAGNOSIS — R911 Solitary pulmonary nodule: Secondary | ICD-10-CM | POA: Insufficient documentation

## 2016-10-08 ENCOUNTER — Telehealth: Payer: Self-pay | Admitting: *Deleted

## 2016-10-08 NOTE — Telephone Encounter (Signed)
Call placed to patient to notify him per order of Dr. Benay Spice that CT's are negative for cancer and to f/u as scheduled.  Patient appreciative of call and has no questions at this time.

## 2016-10-08 NOTE — Telephone Encounter (Signed)
-----   Message from [name], MD sent at 10/08/2016  5:47 AM EDT ----- Please call patient, CTs are negative for cancer, f/u as scheduled

## 2016-10-13 ENCOUNTER — Other Ambulatory Visit: Payer: Self-pay | Admitting: Internal Medicine

## 2016-10-14 ENCOUNTER — Telehealth: Payer: Self-pay

## 2016-10-14 ENCOUNTER — Ambulatory Visit (INDEPENDENT_AMBULATORY_CARE_PROVIDER_SITE_OTHER): Payer: Medicare Other

## 2016-10-14 ENCOUNTER — Telehealth: Payer: Self-pay | Admitting: Internal Medicine

## 2016-10-14 ENCOUNTER — Ambulatory Visit (INDEPENDENT_AMBULATORY_CARE_PROVIDER_SITE_OTHER): Payer: Medicare Other | Admitting: Physician Assistant

## 2016-10-14 ENCOUNTER — Telehealth: Payer: Self-pay | Admitting: Physician Assistant

## 2016-10-14 ENCOUNTER — Encounter: Payer: Self-pay | Admitting: Physician Assistant

## 2016-10-14 VITALS — BP 142/80 | HR 79 | Temp 99.1°F | Ht 68.0 in | Wt 208.0 lb

## 2016-10-14 DIAGNOSIS — R197 Diarrhea, unspecified: Secondary | ICD-10-CM

## 2016-10-14 DIAGNOSIS — N184 Chronic kidney disease, stage 4 (severe): Secondary | ICD-10-CM | POA: Diagnosis not present

## 2016-10-14 DIAGNOSIS — K5790 Diverticulosis of intestine, part unspecified, without perforation or abscess without bleeding: Secondary | ICD-10-CM | POA: Diagnosis not present

## 2016-10-14 DIAGNOSIS — R634 Abnormal weight loss: Secondary | ICD-10-CM | POA: Diagnosis not present

## 2016-10-14 DIAGNOSIS — C20 Malignant neoplasm of rectum: Secondary | ICD-10-CM | POA: Diagnosis not present

## 2016-10-14 LAB — CBC WITH DIFFERENTIAL/PLATELET
BASOS PCT: 0.5 % (ref 0.0–3.0)
Basophils Absolute: 0 10*3/uL (ref 0.0–0.1)
EOS ABS: 0.1 10*3/uL (ref 0.0–0.7)
Eosinophils Relative: 1.4 % (ref 0.0–5.0)
HEMATOCRIT: 35.3 % — AB (ref 39.0–52.0)
Hemoglobin: 11.5 g/dL — ABNORMAL LOW (ref 13.0–17.0)
LYMPHS PCT: 13.2 % (ref 12.0–46.0)
Lymphs Abs: 0.8 10*3/uL (ref 0.7–4.0)
MCHC: 32.4 g/dL (ref 30.0–36.0)
MCV: 87.4 fl (ref 78.0–100.0)
MONO ABS: 0.6 10*3/uL (ref 0.1–1.0)
Monocytes Relative: 10.3 % (ref 3.0–12.0)
NEUTROS ABS: 4.6 10*3/uL (ref 1.4–7.7)
Neutrophils Relative %: 74.6 % (ref 43.0–77.0)
PLATELETS: 221 10*3/uL (ref 150.0–400.0)
RBC: 4.04 Mil/uL — ABNORMAL LOW (ref 4.22–5.81)
RDW: 16 % — AB (ref 11.5–15.5)
WBC: 6.2 10*3/uL (ref 4.0–10.5)

## 2016-10-14 LAB — POCT URINALYSIS DIPSTICK
Bilirubin, UA: NEGATIVE
Glucose, UA: NEGATIVE
KETONES UA: NEGATIVE
LEUKOCYTES UA: NEGATIVE
Nitrite, UA: NEGATIVE
RBC UA: NEGATIVE
SPEC GRAV UA: 1.025 (ref 1.030–1.035)
Urobilinogen, UA: 0.2 (ref ?–2.0)
pH, UA: 6 (ref 5.0–8.0)

## 2016-10-14 LAB — URINALYSIS, ROUTINE W REFLEX MICROSCOPIC
BILIRUBIN URINE: NEGATIVE
HGB URINE DIPSTICK: NEGATIVE
KETONES UR: NEGATIVE
LEUKOCYTES UA: NEGATIVE
Nitrite: NEGATIVE
RBC / HPF: NONE SEEN (ref 0–?)
Specific Gravity, Urine: 1.015 (ref 1.000–1.030)
Total Protein, Urine: 30 — AB
Urine Glucose: NEGATIVE
Urobilinogen, UA: 0.2 (ref 0.0–1.0)
WBC UA: NONE SEEN (ref 0–?)
pH: 5.5 (ref 5.0–8.0)

## 2016-10-14 LAB — COMPREHENSIVE METABOLIC PANEL
ALK PHOS: 59 U/L (ref 39–117)
ALT: 9 U/L (ref 0–53)
AST: 13 U/L (ref 0–37)
Albumin: 4.5 g/dL (ref 3.5–5.2)
BUN: 75 mg/dL — AB (ref 6–23)
CO2: 27 meq/L (ref 19–32)
Calcium: 11 mg/dL — ABNORMAL HIGH (ref 8.4–10.5)
Chloride: 101 mEq/L (ref 96–112)
Creatinine, Ser: 4.51 mg/dL (ref 0.40–1.50)
GFR: 16.71 mL/min — AB (ref >60.00)
GLUCOSE: 96 mg/dL (ref 70–99)
POTASSIUM: 3.8 meq/L (ref 3.5–5.1)
SODIUM: 139 meq/L (ref 135–145)
TOTAL PROTEIN: 8 g/dL (ref 6.0–8.3)
Total Bilirubin: 0.4 mg/dL (ref 0.2–1.2)

## 2016-10-14 LAB — LIPASE: Lipase: 76 U/L — ABNORMAL HIGH (ref 11.0–59.0)

## 2016-10-14 NOTE — Progress Notes (Signed)
Pre visit review using our clinic review tool, if applicable. No additional management support is needed unless otherwise documented below in the visit note. 

## 2016-10-14 NOTE — Telephone Encounter (Signed)
Spoke to pt, told him Aldona Bar would like for you  to hold '80mg'$  lasix and 25 mg chlorthalidone until you are taking in normal amounts of liquids and no longer having diarrhea. She does not want you to hold these medications for longer than 2-3 days. Weigh self daily. If your weight increases by more than 3 lbs, restart both medications. Pt verbalized understanding. Also told pt needs to see Dr. Ronnald Ramp Thursday of this week Monday at the latest per [facility]. Pt verbalized understanding and will call back and reschedule.

## 2016-10-14 NOTE — Telephone Encounter (Signed)
Pt reports a 3 day HX of diarrhea. Approximately 3 stools per day. He states "after I eat or drink it goes right through me." Reports one episode of blood on toilet paper but declines any in the toilet. Denies any abdominal pain, nausea, vomiting but possibly a low grade fever. Had colon cancer 4 years ago  with no Hx of hemmorrhoids that he knows of. Took Imodium with some relief. Pending appt with colon cancer doctor in August as a routine follow up. Have not researched the physician but it may be a good idea to fax today's visit to MD as Juluis Rainier.

## 2016-10-14 NOTE — Progress Notes (Signed)
Jason [name]. is a 71 y.o. male here for Diarrhea  I acted as a Education administrator for [employer], PA-C Anselmo Pickler, LPN  History of Present Illness:   Chief Complaint  Patient presents with  . Diarrhea    x 3 days watery stools, 2 to 3 times a day    HPI  [name] has a past medical history of rectal cancer, c diff, stage IV CKD, diverticulosis reports that he has been having watery episodes of diarrhea, approximately 2-3 times daily. Occurs just seconds after eating or drinking. Diarrhea is light brown in color with a lot of water. Has not noticed any black stool or gross blood in the toilet. Did have a smearing of blood when he wiped this morning.  Took 2-3 immodium "capfuls" yesterday. Taking fibercon as prescribed. No suspicous food intake. No fever. No pain. No issues with urination. No recent antibiotics. No nausea/vomiting. He has not eaten anything today, but he tried eating grilled chicken and string beans yesterday. He has had some water, Sprite, and Koolaid.  He does endorse unintentional weight loss, has lost 8lb in 3 months (4% of his body weight) and 28lb in 12 months (12% of his body weight).  Most recent CT scan by oncology performed on 09/30/16 which revealed no new or progressive findings, did show evidence of diverticulosis.  PMHx, SurgHx, SocialHx, Medications, and Allergies were reviewed in the Visit Navigator and updated as appropriate.  Current Medications:   Current Outpatient Prescriptions:  .  acetaminophen (TYLENOL) 500 MG tablet, Take 1,000 mg by mouth every 6 (six) hours as needed for moderate pain or headache., Disp: , Rfl:  .  allopurinol (ZYLOPRIM) 300 MG tablet, TAKE 300 MG BY MOUTH ONCE  DAILY, Disp: 90 tablet, Rfl: 1 .  BYSTOLIC 5 MG tablet, TAKE ONE TABLET BY MOUTH ONCE DAILY, Disp: 30 tablet, Rfl: 5 .  chlorthalidone (HYGROTON) 25 MG tablet, Take 1 tablet (25 mg total) by mouth daily., Disp: 90 tablet, Rfl: 1 .  doxazosin (CARDURA) 8 MG tablet,  Take 1 tablet (8 mg total) by mouth at bedtime., Disp: 90 tablet, Rfl: 3 .  furosemide (LASIX) 80 MG tablet, Take 1 tablet (80 mg total) by mouth daily., Disp: 90 tablet, Rfl: 3 .  gabapentin (NEURONTIN) 300 MG capsule, Take 300 mg by mouth 3 (three) times daily. Take '300mg'$ s morning and '600mg'$ s at bedtime, Disp: , Rfl:  .  KLOR-CON M20 20 MEQ tablet, Take 1 tablet (20 mEq total) by mouth daily., Disp: 90 tablet, Rfl: 3 .  loperamide (IMODIUM) 2 MG capsule, Take 2 mg by mouth as needed for diarrhea or loose stools., Disp: , Rfl:  .  nebivolol (BYSTOLIC) 5 MG tablet, Take 1 tablet (5 mg total) by mouth daily., Disp: 90 tablet, Rfl: 1 .  polycarbophil (FIBERCON) 625 MG tablet, Take 1,250 mg by mouth daily., Disp: , Rfl:    Review of Systems:   Review of Systems  Constitutional: Positive for weight loss.  HENT: Negative.   Eyes: Negative.   Respiratory: Positive for cough and sputum production.        Expectorating yellow sputum  Cardiovascular: Negative.   Gastrointestinal: Positive for diarrhea.       X 3 days, watery stools going 2-3 times a day, had blood x 1 with wiping today.  Genitourinary: Negative.   Musculoskeletal: Negative.   Neurological: Positive for weakness. Negative for dizziness and headaches.  Endo/Heme/Allergies: Negative.   Psychiatric/Behavioral: Negative.  Vitals:   Vitals:   10/14/16 1111  BP: (!) 142/80  Pulse: 79  Temp: 99.1 F (37.3 C)  TempSrc: Oral  SpO2: 96%  Weight: 208 lb (94.3 kg)  Height: '5\' 8"'$  (1.727 m)     Body mass index is 31.63 kg/m.  Physical Exam:   Physical Exam  Constitutional: He appears well-developed. He is cooperative.  Non-toxic appearance. He does not have a sickly appearance. He does not appear ill. No distress.  Cardiovascular: Normal rate, regular rhythm, S1 normal, S2 normal, normal heart sounds and normal pulses.   No LE edema  Pulmonary/Chest: Effort normal and breath sounds normal.  Abdominal: Normal appearance and  bowel sounds are normal. There is tenderness in the right lower quadrant and periumbilical area. There is no rigidity, no rebound and no guarding. No hernia.  Colostomy scar in RLQ  Genitourinary: Rectal exam shows no external hemorrhoid, no fissure, no mass, no tenderness and guaiac negative stool.  Neurological: He is alert.  Nursing note and vitals reviewed.   Results for orders placed or performed in visit on 10/14/16  CBC with Differential/Platelet  Result Value Ref Range   WBC 6.2 4.0 - 10.5 K/uL   RBC 4.04 (L) 4.22 - 5.81 Mil/uL   Hemoglobin 11.5 (L) 13.0 - 17.0 g/dL   HCT 35.3 (L) 39.0 - 52.0 %   MCV 87.4 78.0 - 100.0 fl   MCHC 32.4 30.0 - 36.0 g/dL   RDW 16.0 (H) 11.5 - 15.5 %   Platelets 221.0 150.0 - 400.0 K/uL   Neutrophils Relative % 74.6 43.0 - 77.0 %   Lymphocytes Relative 13.2 12.0 - 46.0 %   Monocytes Relative 10.3 3.0 - 12.0 %   Eosinophils Relative 1.4 0.0 - 5.0 %   Basophils Relative 0.5 0.0 - 3.0 %   Neutro Abs 4.6 1.4 - 7.7 K/uL   Lymphs Abs 0.8 0.7 - 4.0 K/uL   Monocytes Absolute 0.6 0.1 - 1.0 K/uL   Eosinophils Absolute 0.1 0.0 - 0.7 K/uL   Basophils Absolute 0.0 0.0 - 0.1 K/uL  Comprehensive metabolic panel  Result Value Ref Range   Sodium 139 135 - 145 mEq/L   Potassium 3.8 3.5 - 5.1 mEq/L   Chloride 101 96 - 112 mEq/L   CO2 27 19 - 32 mEq/L   Glucose, Bld 96 70 - 99 mg/dL   BUN 75 (H) 6 - 23 mg/dL   Creatinine, Ser 4.51 (HH) 0.40 - 1.50 mg/dL   Total Bilirubin 0.4 0.2 - 1.2 mg/dL   Alkaline Phosphatase 59 39 - 117 U/L   AST 13 0 - 37 U/L   ALT 9 0 - 53 U/L   Total Protein 8.0 6.0 - 8.3 g/dL   Albumin 4.5 3.5 - 5.2 g/dL   Calcium 11.0 (H) 8.4 - 10.5 mg/dL   GFR 16.71 (L) >60.00 mL/min  Lipase  Result Value Ref Range   Lipase 76.0 (H) 11.0 - 59.0 U/L  Urinalysis, Routine w reflex microscopic  Result Value Ref Range   Color, Urine YELLOW Yellow;Lt. Yellow   APPearance CLEAR Clear   Specific Gravity, Urine 1.015 1.000 - 1.030   pH 5.5 5.0 -  8.0   Total Protein, Urine 30 (A) Negative   Urine Glucose NEGATIVE Negative   Ketones, ur NEGATIVE Negative   Bilirubin Urine NEGATIVE Negative   Hgb urine dipstick NEGATIVE Negative   Urobilinogen, UA 0.2 0.0 - 1.0   Leukocytes, UA NEGATIVE Negative   Nitrite NEGATIVE Negative  WBC, UA none seen 0-2/hpf   RBC / HPF none seen 0-2/hpf   Squamous Epithelial / LPF Rare(0-4/hpf) Rare(0-4/hpf)  POCT urinalysis dipstick  Result Value Ref Range   Color, UA Yellow    Clarity, UA Clear    Glucose, UA Negative    Bilirubin, UA Negative    Ketones, UA Negative    Spec Grav, UA 1.025 1.030 - 1.035   Blood, UA Negative    pH, UA 6.0 5.0 - 8.0   Protein, UA 30 mg/dL    Urobilinogen, UA 0.2 Negative - 2.0   Nitrite, UA Negative    Leukocytes, UA Negative Negative    IMPRESSION: No evidence of bowel obstruction or ileus. The colonic stool burden is normal. If there are clinical concerns of recurrent colitis, abdominal and pelvic CT scanning would be the most useful next imaging step.  Assessment and Plan:    Henning was seen today for diarrhea.  Diagnoses and all orders for this visit:  Diarrhea, unspecified type -     DG Abd 2 Views; Future -     CBC with Differential/Platelet -     Comprehensive metabolic panel -     Lipase -     Urinalysis, Routine w reflex microscopic -     Clostridium Difficile by PCR; Future -     Ova and parasite examination; Future -     Stool culture; Future  Rectal cancer (HCC)  Chronic renal impairment, stage 4 (severe) (HCC) -     POCT urinalysis dipstick  Unintentional weight loss   Reviewed case with Dr. Teresa Coombs. Vitals currently stable. Urine is unremarkable. Will obtain routine labs to assess for infection, worsening renal function or other organic causes of patient's symptoms. Also ordered stool cultures. Abdominal xray without evidence of obstruction or ileus. I advised patient as follows: "Please work on staying hydrated. If you  continue to not be able to keep water down, you will need IV fluids and I would like for you to go the emergency room. Also, you will need to go to the emergency room if you develop any shortness of breath, abdominal pain, chest pain, dizziness, lightheadedness, or if there is an increased amount of blood that comes from your rectum." If symptoms persist, follow up with PCP, may need to be re-evaluated by GI.  Marland Kitchen Reviewed expectations re: course of current medical issues. . Discussed self-management of symptoms. . Outlined signs and symptoms indicating need for more acute intervention. . Patient verbalized understanding and all questions were answered. . See orders for this visit as documented in the electronic medical record. . Patient received an After-Visit Summary.  CMA or LPN served as scribe during this visit. History, Physical, and Plan performed by medical provider. Documentation and orders reviewed and attested to.  Inda Coke, PA-C

## 2016-10-14 NOTE — Telephone Encounter (Signed)
Please call patient and have him hold his '80mg'$  lasix and 25 mg chlorthalidone until he is taking in normal amounts of liquids and no longer having diarrhea. I do not want him to hold these medications for longer than 2-3 days. Weigh self daily. If your weight increases by more than 3 lbs, restart both medications.

## 2016-10-14 NOTE — Patient Instructions (Addendum)
We will call you with your lab and xray results.  Please work on staying hydrated. If you continue to not be able to keep water down, you will need IV fluids and I would like for you to go the emergency room. Also, you will need to go to the emergency room if you develop any shortness of breath, abdominal pain, chest pain, dizziness, lightheadedness, or if there is an increased amount of blood that comes from your rectum.   Bland Diet A bland diet consists of foods that do not have a lot of fat or fiber. Foods without fat or fiber are easier for the body to digest. They are also less likely to irritate your mouth, throat, stomach, and other parts of your gastrointestinal tract. What is my plan? Your health care provider or dietitian may recommend specific changes to your diet to prevent and treat your symptoms, such as:  Eating small meals often.  Cooking food until it is soft enough to chew easily.  Chewing your food well.  Drinking fluids slowly.  Not eating foods that are very spicy, sour, or fatty.  Not eating citrus fruits, such as oranges and grapefruit. What do I need to know about this diet?  Eat a variety of foods from the bland diet food list.  Do not follow a bland diet longer than you have to.  Ask your health care provider whether you should take vitamins. What foods can I eat? Grains   Hot cereals, such as cream of wheat. Bread, crackers, or tortillas made from refined white flour. Rice. Vegetables  Canned or cooked vegetables. Mashed or boiled potatoes. Fruits   Applesauce. Other types of cooked or canned fruit with the skin and seeds removed, such as canned peaches or pears. Meats and Other Protein Sources  Scrambled eggs. Creamy peanut butter or other nut butters. Lean, well-cooked meats, such as chicken or fish. Tofu. Soups or broths. Dairy  Low-fat dairy products, such as milk, cottage cheese, or yogurt. Beverages  Water. Herbal tea. Apple juice. Sweets  and Desserts  Pudding. Custard. Fruit gelatin. Ice cream. Fats and Oils  Mild salad dressings. Canola or olive oil. The items listed above may not be a complete list of allowed foods or beverages. Contact your dietitian for more options.  What foods are not recommended? Foods and ingredients that are often not recommended include:  Spicy foods, such as hot sauce or salsa.  Fried foods.  Sour foods, such as pickled or fermented foods.  Raw vegetables or fruits, especially citrus or berries.  Caffeinated drinks.  Alcohol.  Strongly flavored seasonings or condiments. The items listed above may not be a complete list of foods and beverages that are not allowed. Contact your dietitian for more information.  This information is not intended to replace advice given to you by your health care provider. Make sure you discuss any questions you have with your health care provider. Document Released: 10/15/2015 Document Revised: 11/29/2015 Document Reviewed: 07/05/2014 Elsevier Interactive Patient Education  2017 Else[employer] Inc.   Diarrhea, Adult Diarrhea is frequent loose and watery bowel movements. Diarrhea can make you feel weak and cause you to become dehydrated. Dehydration can make you tired and thirsty, cause you to have a dry mouth, and decrease how often you urinate. Diarrhea typically lasts 2-3 days. However, it can last longer if it is a sign of something more serious. It is important to treat your diarrhea as told by your health care provider. Follow these instructions at  home: Eating and drinking   Follow these recommendations as told by your health care provider:  Take an oral rehydration solution (ORS). This is a drink that is sold at pharmacies and retail stores.  Drink clear fluids, such as water, ice chips, diluted fruit juice, and low-calorie sports drinks.  Eat bland, easy-to-digest foods in small amounts as you are able. These foods include bananas, applesauce, rice,  lean meats, toast, and crackers.  Avoid drinking fluids that contain a lot of sugar or caffeine, such as energy drinks, sports drinks, and soda.  Avoid alcohol.  Avoid spicy or fatty foods. General instructions   Drink enough fluid to keep your urine clear or pale yellow.  Wash your hands often. If soap and water are not available, use hand sanitizer.  Make sure that all people in your household wash their hands well and often.  Take over-the-counter and prescription medicines only as told by your health care provider.  Rest at home while you recover.  Watch your condition for any changes.  Take a warm bath to relieve any burning or pain from frequent diarrhea episodes.  Keep all follow-up visits as told by your health care provider. This is important. Contact a health care provider if:  You have a fever.  Your diarrhea gets worse.  You have new symptoms.  You cannot keep fluids down.  You feel light-headed or dizzy.  You have a headache  You have muscle cramps. Get help right away if:  You have chest pain.  You feel extremely weak or you faint.  You have bloody or black stools or stools that look like tar.  You have severe pain, cramping, or bloating in your abdomen.  You have trouble breathing or you are breathing very quickly.  Your heart is beating very quickly.  Your skin feels cold and clammy.  You feel confused.  You have signs of dehydration, such as:  Dark urine, very little urine, or no urine.  Cracked lips.  Dry mouth.  Sunken eyes.  Sleepiness.  Weakness. This information is not intended to replace advice given to you by your health care provider. Make sure you discuss any questions you have with your health care provider. Document Released: 06/13/2002 Document Revised: 11/01/2015 Document Reviewed: 02/27/2015 Elsevier Interactive Patient Education  2017 [employer].

## 2016-10-14 NOTE — Telephone Encounter (Signed)
PLEASE NOTE: All timestamps contained within this report are represented as [place] Standard Time. CONFIDENTIALTY NOTICE: This fax transmission is intended only for the addressee. It contains information that is legally privileged, confidential or otherwise protected from use or disclosure. If you are not the intended recipient, you are strictly prohibited from reviewing, disclosing, copying using or disseminating any of this information or taking any action in reliance on or regarding this information. If you have received this fax in error, please notify us immediately by telephone so that we can arrange for its return to [place]. Phone: [phone], Toll-Free: [phone], Fax: [phone] Page: 1 of 1 Call Id: [id number] [place] [facility] - Client [facility] Patient Name: Jason [name] DOB: [birth year] Initial Comment has diarrhea everytime he eats or drinks anything for the past 3 days Nurse Assessment Nurse: Rock Nephew, RN, Juliann Pulse Date/Time (Eastern Time): 10/14/2016 8:28:53 AM Confirm and document reason for call. If symptomatic, describe symptoms. ---Caller states he has diarrhea, every time he eats or drinks anything for the past 3 days it " goes right through him " . Does the patient have any new or worsening symptoms? ---Yes Will a triage be completed? ---Yes Related visit to physician within the last 2 weeks? ---No Does the PT have any chronic conditions? (i.e. diabetes, asthma, etc.) ---Yes List chronic conditions. ---HTN Is this a behavioral health or substance abuse call? ---No Guidelines Guideline Title Affirmed Question Affirmed Notes Diarrhea [1] SEVERE diarrhea (e.g., 7 or more times / day more than normal) AND [2] age > 60 years Final Disposition User See Physician within 4 Hours (or PCP triage) Rock Nephew, RN, Juliann Pulse Comments Appointment scheduled for patient per A. Hensel RN at the [place] office for [employer]  today and  verified with patient. Office address and phone number provided to patient. Referrals REFERRED TO PCP OFFICE Disagree/Comply: Comply

## 2016-10-14 NOTE — Telephone Encounter (Signed)
CRITICAL VALUE STICKER  CRITICAL VALUE: Creatine 4.51  RECEIVER (on-site recipient of call): [facility]: Artis Delay  DATE & TIME NOTIFIED: 10/14/2016, 2:33  MESSENGER (representative from lab): Tresa Moore  MD NOTIFIED: Inda Coke, PA  TIME OF NOTIFICATION: 2:33

## 2016-10-15 ENCOUNTER — Telehealth: Payer: Self-pay | Admitting: Physician Assistant

## 2016-10-15 NOTE — Telephone Encounter (Signed)
Please call patient and see how he is feeling.  Thanks.

## 2016-10-15 NOTE — Telephone Encounter (Signed)
Spoke to pt, asked him how he was feeling this morning? Pt said the same still have the problem. Asked pt how many times did he have diarrhea yesterday after he saw [place]? Pt said none till around 2:00 AM then had two times. Asked pt if any pain or nausea? Pt said no. Asked pt if voiding okay? Pt said yes, voiding without difficulty and drinking. Told pt okay, remember to weigh yourself this morning and hold medication as directed. Pt verbalized understanding. Also I saw you have an appt with Dr. Ronnald Ramp tomorrow. If you have any increase in symptoms nausea, vomiting, rectal bleeding please go to the emergency room. Pt verbalized understanding.

## 2016-10-16 ENCOUNTER — Ambulatory Visit (INDEPENDENT_AMBULATORY_CARE_PROVIDER_SITE_OTHER): Payer: Medicare Other | Admitting: Internal Medicine

## 2016-10-16 VITALS — BP 130/60 | HR 83 | Temp 99.0°F | Resp 16 | Ht 68.0 in | Wt 211.0 lb

## 2016-10-16 DIAGNOSIS — A084 Viral intestinal infection, unspecified: Secondary | ICD-10-CM | POA: Diagnosis not present

## 2016-10-16 DIAGNOSIS — I10 Essential (primary) hypertension: Secondary | ICD-10-CM | POA: Diagnosis not present

## 2016-10-16 NOTE — Progress Notes (Signed)
Subjective:  Patient ID: Jason Cotta Sr., male    DOB: [birth year]  Age: 71 y.o. MRN: [id number]  CC: Diarrhea   HPI Jason CISLO Sr. presents for follow-up after a recent episode of diarrhea. He now tells me he hasn't had a bowel movement for 2 days and feels like he has recovered. During this time he has lost some eight and he had a few episodes of weakness and lightheadedness but all in all he says he is feeling much better.  Outpatient Medications Prior to Visit  Medication Sig Dispense Refill  . allopurinol (ZYLOPRIM) 300 MG tablet TAKE 300 MG BY MOUTH ONCE  DAILY 90 tablet 1  . BYSTOLIC 5 MG tablet TAKE ONE TABLET BY MOUTH ONCE DAILY 30 tablet 5  . chlorthalidone (HYGROTON) 25 MG tablet Take 1 tablet (25 mg total) by mouth daily. 90 tablet 1  . furosemide (LASIX) 80 MG tablet Take 1 tablet (80 mg total) by mouth daily. 90 tablet 3  . gabapentin (NEURONTIN) 300 MG capsule Take 300 mg by mouth 3 (three) times daily. Take '300mg'$ s morning and '600mg'$ s at bedtime    . KLOR-CON M20 20 MEQ tablet Take 1 tablet (20 mEq total) by mouth daily. 90 tablet 3  . loperamide (IMODIUM) 2 MG capsule Take 2 mg by mouth as needed for diarrhea or loose stools.    . polycarbophil (FIBERCON) 625 MG tablet Take 1,250 mg by mouth daily.    Marland Kitchen acetaminophen (TYLENOL) 500 MG tablet Take 1,000 mg by mouth every 6 (six) hours as needed for moderate pain or headache.    . doxazosin (CARDURA) 8 MG tablet Take 1 tablet (8 mg total) by mouth at bedtime. 90 tablet 3  . nebivolol (BYSTOLIC) 5 MG tablet Take 1 tablet (5 mg total) by mouth daily. (Patient not taking: Reported on 10/17/2016) 90 tablet 1   No facility-administered medications prior to visit.     ROS Review of Systems  Constitutional: Negative for chills, diaphoresis, fatigue and unexpected weight change.  HENT: Negative.  Negative for trouble swallowing.   Eyes: Negative.   Respiratory: Negative.  Negative for cough, chest tightness and shortness of  breath.   Cardiovascular: Negative for chest pain, palpitations and leg swelling.  Gastrointestinal: Negative for abdominal pain, constipation, diarrhea and nausea.  Endocrine: Negative.   Genitourinary: Negative.   Musculoskeletal: Negative.  Negative for back pain and neck pain.  Skin: Negative.  Negative for color change.  Allergic/Immunologic: Negative.   Neurological: Negative.  Negative for dizziness, weakness and light-headedness.  Hematological: Negative for adenopathy. Does not bruise/bleed easily.    Objective:  BP 130/60 (BP Location: Right Arm, Patient Position: Sitting, Cuff Size: Large)   Pulse 83   Temp 99 F (37.2 C) (Oral)   Resp 16   Ht '5\' 8"'$  (1.727 m)   Wt 211 lb (95.7 kg)   SpO2 100%   BMI 32.08 kg/m   BP Readings from Last 3 Encounters:  10/17/16 (!) 154/78  10/16/16 130/60  10/14/16 (!) 142/80    Wt Readings from Last 3 Encounters:  10/17/16 209 lb (94.8 kg)  10/16/16 211 lb (95.7 kg)  10/14/16 208 lb (94.3 kg)    Physical Exam  Constitutional: He is oriented to person, place, and time.  Non-toxic appearance. He does not have a sickly appearance. He does not appear ill. No distress.  HENT:  Mouth/Throat: Oropharynx is clear and moist. No oropharyngeal exudate.  Eyes: Conjunctivae are normal. Right eye exhibits  no discharge. Left eye exhibits no discharge. No scleral icterus.  Neck: Normal range of motion. Neck supple. No JVD present. No tracheal deviation present. No thyromegaly present.  Cardiovascular: Normal rate, regular rhythm, normal heart sounds and intact distal pulses.  Exam reveals no gallop and no friction rub.   No murmur heard. Pulmonary/Chest: Effort normal and breath sounds normal. No stridor. No respiratory distress. He has no wheezes. He has no rales. He exhibits no tenderness.  Abdominal: Soft. Bowel sounds are normal. He exhibits no distension and no mass. There is no tenderness. There is no rebound and no guarding.    Musculoskeletal: Normal range of motion. He exhibits no edema, tenderness or deformity.  Lymphadenopathy:    He has no cervical adenopathy.  Neurological: He is oriented to person, place, and time.  Skin: Skin is warm and dry. No rash noted. He is not diaphoretic. No erythema. No pallor.  Vitals reviewed.   Lab Results  Component Value Date   WBC 5.9 10/17/2016   HGB 11.1 (L) 10/17/2016   HCT 34.2 (L) 10/17/2016   PLT 202 10/17/2016   GLUCOSE 111 (H) 10/17/2016   CHOL 208 (H) 06/04/2015   TRIG 203.0 (H) 06/04/2015   HDL 34.80 (L) 06/04/2015   LDLDIRECT 120.0 06/04/2015   LDLCALC 131 (H) 05/29/2010   ALT 14 (L) 10/17/2016   AST 18 10/17/2016   NA 138 10/17/2016   K 3.4 (L) 10/17/2016   CL 104 10/17/2016   CREATININE 4.38 (H) 10/17/2016   BUN 72 (H) 10/17/2016   CO2 23 10/17/2016   TSH 1.90 08/22/2015   PSA 1.86 08/10/2013   INR 1.11 11/16/2015   HGBA1C 6.0 12/20/2009    Ct Abdomen Pelvis Wo Contrast  Result Date: 09/30/2016 CLINICAL DATA:  Rectal cancer restaging. Left upper lobe wedge resection of metastatic lesion on 11/20/2015. EXAM: CT CHEST, ABDOMEN AND PELVIS WITHOUT CONTRAST TECHNIQUE: Multidetector CT imaging of the chest, abdomen and pelvis was performed following the standard protocol without IV contrast. COMPARISON:  CHEST ABDOMEN AND PELVIS CT FROM 03/17/2016. PET-CT 10/22/2015. FINDINGS: CT CHEST FINDINGS Cardiovascular: The heart size is normal. No pericardial effusion. Coronary artery calcification is noted. Atherosclerotic calcification is noted in the wall of the thoracic aorta. Mediastinum/Nodes: No mediastinal lymphadenopathy. No evidence for gross hilar lymphadenopathy although assessment is limited by the lack of intravenous contrast on today's study. The esophagus has normal imaging features. There is no axillary lymphadenopathy. Lungs/Pleura: Suture line from prior wedge resection with surrounding nodular soft tissue attenuation becomes more linear in the  peripheral lung. Overall imaging appearance is stable since 03/17/2016. This is associated with some retraction of the major fissure in the same region. 7 mm anterior right upper lobe pulmonary nodule seen image 50 series 6 is similar to prior. Bilateral calcified pleural plaques are evident. No focal airspace consolidation. No pulmonary edema or pleural effusion. Musculoskeletal: Bone windows reveal no worrisome lytic or sclerotic osseous lesions. CT ABDOMEN PELVIS FINDINGS Hepatobiliary: No focal abnormality in the liver on this study without intravenous contrast. There is no evidence for gallstones, gallbladder wall thickening, or pericholecystic fluid. No intrahepatic or extrahepatic biliary dilation. Pancreas: No focal mass lesion. No dilatation of the main duct. No intraparenchymal cyst. No peripancreatic edema. Spleen: No splenomegaly. No focal mass lesion. Adrenals/Urinary Tract: No adrenal nodule or mass. No substantial change 11 mm hyperattenuating lesion interpolar right kidney. Several other lesions in the lower pole the right kidney are stable and in compatible with simple cyst. 17 mm  hyperattenuating lesion in the interpolar left kidney is stable. No evidence for hydroureter. The urinary bladder appears normal for the degree of distention. Stomach/Bowel: Stomach is nondistended. No gastric wall thickening. No evidence of outlet obstruction. Duodenum is normally positioned as is the ligament of Treitz. No small bowel wall thickening. No small bowel dilatation. The terminal ileum is normal. The appendix is normal. Diverticuli are seen scattered along the entire length of the colon without CT findings of diverticulitis. Vascular/Lymphatic: There is abdominal aortic atherosclerosis without aneurysm. There is no gastrohepatic or hepatoduodenal ligament lymphadenopathy. No intraperitoneal or retroperitoneal lymphadenopathy. No pelvic sidewall lymphadenopathy. Reproductive: The prostate gland and seminal  vesicles have normal imaging features. Other: No intraperitoneal free fluid. Musculoskeletal: Bone windows reveal no worrisome lytic or sclerotic osseous lesions. Small umbilical hernia contains only fat can be site of previous right lower quadrant stoma is stable. IMPRESSION: 1. No new or progressive findings on today's study. 2. Stable appearance of the left upper lobe scarring with a nodular like component surrounding the suture material. Interval stability is reassuring for scar, but continued attention suggested. 3. Bilateral calcified pleural plaques, compatible with previous asbestos exposure. 4. Bilateral renal lesions incompatible with simple cysts. Given stability, these are likely cysts complicated by varying degrees of proteinaceous debris or hemorrhage. Continued attention on follow-up imaging recommended. 5. Diffuse colonic diverticulosis. 6.  Abdominal Aortic Atherosclerois (ICD10-170.0) Electronically Signed   By: Misty Stanley M.D.   On: 09/30/2016 17:02   Ct Chest Wo Contrast  Result Date: 09/30/2016 CLINICAL DATA:  Rectal cancer restaging. Left upper lobe wedge resection of metastatic lesion on 11/20/2015. EXAM: CT CHEST, ABDOMEN AND PELVIS WITHOUT CONTRAST TECHNIQUE: Multidetector CT imaging of the chest, abdomen and pelvis was performed following the standard protocol without IV contrast. COMPARISON:  CHEST ABDOMEN AND PELVIS CT FROM 03/17/2016. PET-CT 10/22/2015. FINDINGS: CT CHEST FINDINGS Cardiovascular: The heart size is normal. No pericardial effusion. Coronary artery calcification is noted. Atherosclerotic calcification is noted in the wall of the thoracic aorta. Mediastinum/Nodes: No mediastinal lymphadenopathy. No evidence for gross hilar lymphadenopathy although assessment is limited by the lack of intravenous contrast on today's study. The esophagus has normal imaging features. There is no axillary lymphadenopathy. Lungs/Pleura: Suture line from prior wedge resection with  surrounding nodular soft tissue attenuation becomes more linear in the peripheral lung. Overall imaging appearance is stable since 03/17/2016. This is associated with some retraction of the major fissure in the same region. 7 mm anterior right upper lobe pulmonary nodule seen image 50 series 6 is similar to prior. Bilateral calcified pleural plaques are evident. No focal airspace consolidation. No pulmonary edema or pleural effusion. Musculoskeletal: Bone windows reveal no worrisome lytic or sclerotic osseous lesions. CT ABDOMEN PELVIS FINDINGS Hepatobiliary: No focal abnormality in the liver on this study without intravenous contrast. There is no evidence for gallstones, gallbladder wall thickening, or pericholecystic fluid. No intrahepatic or extrahepatic biliary dilation. Pancreas: No focal mass lesion. No dilatation of the main duct. No intraparenchymal cyst. No peripancreatic edema. Spleen: No splenomegaly. No focal mass lesion. Adrenals/Urinary Tract: No adrenal nodule or mass. No substantial change 11 mm hyperattenuating lesion interpolar right kidney. Several other lesions in the lower pole the right kidney are stable and in compatible with simple cyst. 17 mm hyperattenuating lesion in the interpolar left kidney is stable. No evidence for hydroureter. The urinary bladder appears normal for the degree of distention. Stomach/Bowel: Stomach is nondistended. No gastric wall thickening. No evidence of outlet obstruction. Duodenum is normally positioned  as is the ligament of Treitz. No small bowel wall thickening. No small bowel dilatation. The terminal ileum is normal. The appendix is normal. Diverticuli are seen scattered along the entire length of the colon without CT findings of diverticulitis. Vascular/Lymphatic: There is abdominal aortic atherosclerosis without aneurysm. There is no gastrohepatic or hepatoduodenal ligament lymphadenopathy. No intraperitoneal or retroperitoneal lymphadenopathy. No pelvic  sidewall lymphadenopathy. Reproductive: The prostate gland and seminal vesicles have normal imaging features. Other: No intraperitoneal free fluid. Musculoskeletal: Bone windows reveal no worrisome lytic or sclerotic osseous lesions. Small umbilical hernia contains only fat can be site of previous right lower quadrant stoma is stable. IMPRESSION: 1. No new or progressive findings on today's study. 2. Stable appearance of the left upper lobe scarring with a nodular like component surrounding the suture material. Interval stability is reassuring for scar, but continued attention suggested. 3. Bilateral calcified pleural plaques, compatible with previous asbestos exposure. 4. Bilateral renal lesions incompatible with simple cysts. Given stability, these are likely cysts complicated by varying degrees of proteinaceous debris or hemorrhage. Continued attention on follow-up imaging recommended. 5. Diffuse colonic diverticulosis. 6.  Abdominal Aortic Atherosclerois (ICD10-170.0) Electronically Signed   By: Misty Stanley M.D.   On: 09/30/2016 17:02    Assessment & Plan:   Jason [name] was seen today for diarrhea.  Diagnoses and all orders for this visit:  Essential hypertension- His blood pressure is normal  Viral gastroenteritis- his symptoms have resolved and now he tells me he hasn't had a bowel movement for 2 days and he tells me that the other systemic symptoms are improving. He is eating and drinking without difficulty. No further evaluation or treatment is indicated at this time.   I have discontinued Jason [name] doxazosin. I am also having him maintain his gabapentin, KLOR-CON M20, furosemide, loperamide, polycarbophil, chlorthalidone, BYSTOLIC, and allopurinol.  No orders of the defined types were placed in this encounter.    Follow-up: No Follow-up on file.  [name], MD

## 2016-10-16 NOTE — Progress Notes (Signed)
Pre visit review using our clinic review tool, if applicable. No additional management support is needed unless otherwise documented below in the visit note. 

## 2016-10-17 ENCOUNTER — Emergency Department (HOSPITAL_COMMUNITY): Payer: Medicare Other

## 2016-10-17 ENCOUNTER — Encounter (HOSPITAL_COMMUNITY): Payer: Self-pay | Admitting: Emergency Medicine

## 2016-10-17 ENCOUNTER — Emergency Department (HOSPITAL_COMMUNITY)
Admission: EM | Admit: 2016-10-17 | Discharge: 2016-10-17 | Disposition: A | Discharge status: Home / Self Care | Payer: Medicare Other | Attending: Emergency Medicine | Admitting: Emergency Medicine

## 2016-10-17 ENCOUNTER — Telehealth: Payer: Self-pay | Admitting: *Deleted

## 2016-10-17 DIAGNOSIS — Z85048 Personal history of other malignant neoplasm of rectum, rectosigmoid junction, and anus: Secondary | ICD-10-CM | POA: Diagnosis not present

## 2016-10-17 DIAGNOSIS — R1030 Lower abdominal pain, unspecified: Secondary | ICD-10-CM | POA: Diagnosis not present

## 2016-10-17 DIAGNOSIS — I129 Hypertensive chronic kidney disease with stage 1 through stage 4 chronic kidney disease, or unspecified chronic kidney disease: Secondary | ICD-10-CM | POA: Insufficient documentation

## 2016-10-17 DIAGNOSIS — Z79899 Other long term (current) drug therapy: Secondary | ICD-10-CM | POA: Diagnosis not present

## 2016-10-17 DIAGNOSIS — R197 Diarrhea, unspecified: Secondary | ICD-10-CM | POA: Insufficient documentation

## 2016-10-17 DIAGNOSIS — Z87891 Personal history of nicotine dependence: Secondary | ICD-10-CM | POA: Insufficient documentation

## 2016-10-17 DIAGNOSIS — N182 Chronic kidney disease, stage 2 (mild): Secondary | ICD-10-CM | POA: Diagnosis not present

## 2016-10-17 DIAGNOSIS — J449 Chronic obstructive pulmonary disease, unspecified: Secondary | ICD-10-CM | POA: Insufficient documentation

## 2016-10-17 LAB — URINALYSIS, ROUTINE W REFLEX MICROSCOPIC
BACTERIA UA: NONE SEEN
BILIRUBIN URINE: NEGATIVE
Glucose, UA: NEGATIVE mg/dL
Hgb urine dipstick: NEGATIVE
Ketones, ur: NEGATIVE mg/dL
LEUKOCYTES UA: NEGATIVE
Nitrite: NEGATIVE
PROTEIN: 30 mg/dL — AB
SPECIFIC GRAVITY, URINE: 1.012 (ref 1.005–1.030)
pH: 5 (ref 5.0–8.0)

## 2016-10-17 LAB — CBC
HEMATOCRIT: 34.2 % — AB (ref 39.0–52.0)
Hemoglobin: 11.1 g/dL — ABNORMAL LOW (ref 13.0–17.0)
MCH: 27.7 pg (ref 26.0–34.0)
MCHC: 32.5 g/dL (ref 30.0–36.0)
MCV: 85.3 fL (ref 78.0–100.0)
Platelets: 202 10*3/uL (ref 150–400)
RBC: 4.01 MIL/uL — ABNORMAL LOW (ref 4.22–5.81)
RDW: 15.4 % (ref 11.5–15.5)
WBC: 5.9 10*3/uL (ref 4.0–10.5)

## 2016-10-17 LAB — COMPREHENSIVE METABOLIC PANEL
ALBUMIN: 4.1 g/dL (ref 3.5–5.0)
ALT: 14 U/L — ABNORMAL LOW (ref 17–63)
AST: 18 U/L (ref 15–41)
Alkaline Phosphatase: 58 U/L (ref 38–126)
Anion gap: 11 (ref 5–15)
BUN: 72 mg/dL — AB (ref 6–20)
CHLORIDE: 104 mmol/L (ref 101–111)
CO2: 23 mmol/L (ref 22–32)
Calcium: 10 mg/dL (ref 8.9–10.3)
Creatinine, Ser: 4.38 mg/dL — ABNORMAL HIGH (ref 0.61–1.24)
GFR calc Af Amer: 15 mL/min — ABNORMAL LOW (ref >60)
GFR calc non Af Amer: 13 mL/min — ABNORMAL LOW (ref >60)
GLUCOSE: 111 mg/dL — AB (ref 65–99)
POTASSIUM: 3.4 mmol/L — AB (ref 3.5–5.1)
Sodium: 138 mmol/L (ref 135–145)
Total Bilirubin: 0.4 mg/dL (ref 0.3–1.2)
Total Protein: 7.4 g/dL (ref 6.5–8.1)

## 2016-10-17 LAB — LIPASE, BLOOD: Lipase: 54 U/L — ABNORMAL HIGH (ref 11–51)

## 2016-10-17 MED ORDER — SODIUM CHLORIDE 0.9 % IV BOLUS (SEPSIS)
500.0000 mL | Freq: Once | INTRAVENOUS | Status: AC
  Administered 2016-10-17: 500 mL via INTRAVENOUS

## 2016-10-17 MED ORDER — MORPHINE SULFATE (PF) 4 MG/ML IV SOLN
4.0000 mg | Freq: Once | INTRAVENOUS | Status: AC
  Administered 2016-10-17: 4 mg via INTRAVENOUS
  Filled 2016-10-17: qty 1

## 2016-10-17 NOTE — Telephone Encounter (Signed)
Pt called to let us know that he was in the ED again today and will not be bringing stool specimen here the hospital is testing. Told him okay I will let Samatha know. Pt verbalized understanding.

## 2016-10-17 NOTE — Discharge Instructions (Signed)
Your labs and CT today were normal. We have sent your stool for culture.  If you get another liquid stool, have your doctor send this to check for c. Diff. Follow-up with Dr. Loletha Carrow if diarrhea persists. Return here for new concerns.

## 2016-10-17 NOTE — ED Notes (Signed)
PT DISCHARGED. INSTRUCTIONS GIVEN. AAOX4. PT IN NO APPARENT DISTRESS. THE OPPORTUNITY TO ASK QUESTIONS WAS PROVIDED. 

## 2016-10-17 NOTE — ED Provider Notes (Signed)
[facility] DEPT Provider Note   CSN: [id number] Arrival date & time: 10/17/16  6160     History   Chief Complaint Chief Complaint  Patient presents with  . Diarrhea    HPI BRADEY LUZIER Sr. is a 71 y.o. male.  The history is provided by the patient and medical records.  Diarrhea   Associated symptoms include abdominal pain.    71 year old male with history of anemia, arthritis, chronic renalsee, COPD, gout, history of rectal cancer status post resection, no longer undergoing treatment, hypertension, presenting to the ED for diarrhea. He reports this is been ongoing for over a week now, wife states almost 2 weeks. States it has been multiple episodes daily, watery and nonbloody. States he saw his primary care doctor for this and had lab work done, but no new treatment. States he's been taking Imodium without relief. States this morning he started having some lower abdominal pain which she describes as cramping. He has not had any vomiting. No fever. He did do a stool culture sample at home and was supposed to take it to his primary care doctor this morning, however since he was having pain wife brought him here. He does have history of diverticulosis. He also has a history of C. difficile in the past. He has not been on any recent antibiotics and has not traveled outside the country. He has not had any sick contacts. Prior abdominal surgeries include colonic resection with colostomy followed by takedown.  Patient followed GI, Dr. Loletha Carrow.  Had a colonoscopy 05/27/16 which was overall normal.  Past Medical History:  Diagnosis Date  . Anemia   . Arthritis   . Chronic renal insufficiency, stage II (mild)   . COPD (chronic obstructive pulmonary disease) ([place])    pt denies  . Diverticulosis   . ED (erectile dysfunction)   . Gout   . History of radiation therapy 02/21/13-03/31/13   rectum 50.4Gy total dose  . Hyperplastic colon polyp   . Hypertension   . rectal ca dx'd 02/01/13   rectal. Radiation and chemotherapy- remains on chemotherapy-next tx. 08-22-13.   . S/P thoracotomy   . Sleep apnea    uses cpap    Patient Active Problem List   Diagnosis Date Noted  . OAB (overactive bladder) 01/09/2016  . SUI (stress urinary incontinence), male 01/09/2016  . S/P partial lobectomy of lung 11/20/2015  . Hypersomnia 10/30/2015  . Obesity (BMI 30.0-34.9) 10/30/2015  . Routine general medical examination at a health care facility 06/04/2015  . Medicare annual wellness visit, subsequent 06/04/2015  . Hereditary and idiopathic peripheral neuropathy 09/14/2014  . Rectal cancer ([place]) 02/01/2013  . Pernicious anemia 07/20/2012  . Chronic renal insufficiency 07/31/2011  . Obstructive sleep apnea 07/29/2010  . GOUT 05/11/2007  . ERECTILE DYSFUNCTION, MILD 05/11/2007  . Essential hypertension 05/11/2007    Past Surgical History:  Procedure Laterality Date  . AV FISTULA PLACEMENT Left 03/24/2016   Procedure: CREATION OF LEFT BRACIOCEPHALIC ARTERIOVENOUS (AV) FISTULA;  Surgeon: Elam Dutch, MD;  Location: [facility];  Service: Vascular;  Laterality: Left;  . COLON SURGERY  05/20/2013  . COLONOSCOPY  2010   [facility]  . EUS N/A 02/03/2013   Procedure: LOWER ENDOSCOPIC ULTRASOUND (EUS);  Surgeon: Milus Banister, MD;  Location: [facility];  Service: Endoscopy;  Laterality: N/A;  . ILEOSTOMY CLOSURE N/A 08/24/2013   Procedure: CLOSURE OF LOOP ILEOSTOMY ;  Surgeon: Leighton Ruff, MD;  Location: [facility];  Service: General;  Laterality: N/A;  .  LAPAROSCOPIC LOW ANTERIOR RESECTION N/A 05/20/2013   Procedure: LAPAROSCOPIC LOW ANTERIOR RESECTION WITH SPLENIC FLEXURE MOBILIZATION;  Surgeon: Leighton Ruff, MD;  Location: [facility];  Service: General;  Laterality: N/A;  . OSTOMY N/A 05/20/2013   Procedure: diverting OSTOMY;  Surgeon: Leighton Ruff, MD;  Location: [facility];  Service: General;  Laterality: N/A;  . VIDEO ASSISTED THORACOSCOPY (VATS)/WEDGE RESECTION Left 11/20/2015   Procedure:  VIDEO ASSISTED THORACOSCOPY (VATS)/LUNG RESECTION;  Surgeon: [name], MD;  Location: [facility];  Service: Thoracic;  Laterality: Left;  Marland Kitchen VIDEO BRONCHOSCOPY N/A 11/20/2015   Procedure: VIDEO BRONCHOSCOPY;  Surgeon: [name], MD;  Location: [facility];  Service: Thoracic;  Laterality: N/A;       Home Medications    Prior to Admission medications   Medication Sig Start Date End Date Taking? Authorizing Provider  acetaminophen (TYLENOL) 500 MG tablet Take 1,000 mg by mouth every 6 (six) hours as needed for moderate pain or headache.    Historical Provider, MD  allopurinol (ZYLOPRIM) 300 MG tablet TAKE 300 MG BY MOUTH ONCE  DAILY 10/13/16   [name], MD  BYSTOLIC 5 MG tablet TAKE ONE TABLET BY MOUTH ONCE DAILY 09/29/16   [name], MD  chlorthalidone (HYGROTON) 25 MG tablet Take 1 tablet (25 mg total) by mouth daily. 06/18/16   [name], MD  furosemide (LASIX) 80 MG tablet Take 1 tablet (80 mg total) by mouth daily. 05/07/16   [name], MD  gabapentin (NEURONTIN) 300 MG capsule Take 300 mg by mouth 3 (three) times daily. Take '300mg'$ s morning and '600mg'$ s at bedtime 08/31/14   Historical Provider, MD  KLOR-CON M20 20 MEQ tablet Take 1 tablet (20 mEq total) by mouth daily. 05/07/16   [name], MD  loperamide (IMODIUM) 2 MG capsule Take 2 mg by mouth as needed for diarrhea or loose stools.    Historical Provider, MD  nebivolol (BYSTOLIC) 5 MG tablet Take 1 tablet (5 mg total) by mouth daily. 05/12/16   [name], MD  polycarbophil (FIBERCON) 625 MG tablet Take 1,250 mg by mouth daily.    Historical Provider, MD    Family History Family History  Problem Relation Age of Onset  . Stroke Mother   . Hypertension Mother   . Stroke Father   . Hypertension Father   . Hypertension Other   . Colon cancer Neg Hx     Social History Social History  Substance Use Topics  . Smoking status: Former Smoker    Packs/day: 0.50    Years: 10.00    Types: Cigarettes     Quit date: 07/07/1977  . Smokeless tobacco: Never Used  . Alcohol use 0.0 oz/week     Comment: drinks occasionally     Allergies   Nsaids and Amlodipine   Review of Systems Review of Systems  Gastrointestinal: Positive for abdominal pain and diarrhea.  All other systems reviewed and are negative.    Physical Exam Updated Vital Signs BP 131/77 (BP Location: Right Arm)   Pulse 88   Temp 98.4 F (36.9 C) (Oral)   Resp 20   Wt 94.3 kg   SpO2 96%   BMI 31.63 kg/m   Physical Exam  Constitutional: He is oriented to person, place, and time. He appears well-developed and well-nourished.  Overall appears well, no distress  HENT:  Head: Normocephalic and atraumatic.  Mouth/Throat: Oropharynx is clear and moist.  Eyes: Conjunctivae and EOM are normal. Pupils are equal,  round, and reactive to light.  Neck: Normal range of motion.  Cardiovascular: Normal rate, regular rhythm and normal heart sounds.   Pulmonary/Chest: Effort normal and breath sounds normal. No respiratory distress. He has no wheezes.  Abdominal: Soft. Bowel sounds are normal. There is tenderness in the suprapubic area and left lower quadrant. There is no rebound.  Abdomen soft, some tenderness in the suprapubic and left lower quadrant, there is no rebound or guarding, no peritonitis  Musculoskeletal: Normal range of motion.  Neurological: He is alert and oriented to person, place, and time.  Skin: Skin is warm and dry.  Psychiatric: He has a normal mood and affect.  Nursing note and vitals reviewed.    ED Treatments / Results  Labs (all labs ordered are listed, but only abnormal results are displayed) Labs Reviewed  LIPASE, BLOOD - Abnormal; Notable for the following:       Result Value   Lipase 54 (*)    All other components within normal limits  COMPREHENSIVE METABOLIC PANEL - Abnormal; Notable for the following:    Potassium 3.4 (*)    Glucose, Bld 111 (*)    BUN 72 (*)    Creatinine, Ser 4.38 (*)     ALT 14 (*)    GFR calc non Af Amer 13 (*)    GFR calc Af Amer 15 (*)    All other components within normal limits  CBC - Abnormal; Notable for the following:    RBC 4.01 (*)    Hemoglobin 11.1 (*)    HCT 34.2 (*)    All other components within normal limits  URINALYSIS, ROUTINE W REFLEX MICROSCOPIC - Abnormal; Notable for the following:    Protein, ur 30 (*)    Squamous Epithelial / LPF 0-5 (*)    All other components within normal limits  GASTROINTESTINAL PANEL BY PCR, STOOL (REPLACES STOOL CULTURE)    EKG  EKG Interpretation None       Radiology Ct Abdomen Pelvis Wo Contrast  Result Date: 10/17/2016 CLINICAL DATA:  Diarrhea for 1 week, lower abdominal pain starting this morning, weight loss, history of rectal cancer, chemotherapy 3 years ago EXAM: CT ABDOMEN AND PELVIS WITHOUT CONTRAST TECHNIQUE: Multidetector CT imaging of the abdomen and pelvis was performed following the standard protocol without IV contrast. COMPARISON:  CT scan 09/30/2016 and PET scan 10/22/2015 FINDINGS: Lower chest: The lung bases shows no acute findings Hepatobiliary: Unenhanced liver shows no biliary ductal dilatation. No calcified gallstones are noted within nondistended gallbladder. Pancreas: Unremarkable. No pancreatic ductal dilatation or surrounding inflammatory changes. Spleen: Normal in size without focal abnormality. Adrenals/Urinary Tract: No adrenal gland mass. Unenhanced kidneys are symmetrical in size. Stable slight hyperdense lesion in mid/lower interpolar region of the right kidney measures 2.9 cm. Stable exophytic hyperdense lesion in lower pole anterior aspect of the right kidney measures 1.1 cm. Stable hyperdense lesion interpolar region of the left kidney Measures 1.7 cm. Given stability this probable represent proteinaceous or hemorrhagic cysts. Continuous surveillance is recommended. No nephrolithiasis. No hydronephrosis or hydroureter. Stomach/Bowel: There is no gastric outlet obstruction. No  thickened or dilated small bowel loops. Again noted colonic diverticulum in right colon transverse colon and descending colon. Some liquid stool noted in right colon. This is suspicious for diarrhea. No thickening of colonic wall to suggest acute colitis. No acute diverticulitis. Normal appendix partially visualized in axial image 64. The transverse colon descending colon and visualized sigmoid colon is empty collapsed. Vascular/Lymphatic: Atherosclerotic calcifications of abdominal aorta and  iliac arteries. No aortic aneurysm. Reproductive: Prostate gland and seminal vesicles are unremarkable. No pelvic mass or adenopathy. Other: No ascites or free abdominal air. No inguinal adenopathy. Stable umbilical hernia containing fat. No evidence of acute complication. The urinary bladder is unremarkable. Musculoskeletal: No destructive bony lesions are noted. Sagittal images of the spine shows stable degenerative changes lower thoracic and lumbar spine. Mild degenerative changes bilateral SI joints. IMPRESSION: 1. Stable bilateral renal lesions. This probable represents proteinaceous or hemorrhagic cysts given stability. However attention on follow-up study is recommended. No nephrolithiasis. No hydronephrosis or hydroureter. 2. No small bowel obstruction. 3. No pericecal inflammation.  Normal appendix. 4. There is some liquid stool in right colon. Diarrhea cannot be excluded. Multiple colonic diverticula are stable. No evidence of acute colitis or diverticulitis. Limited study without IV and oral contrast. The transverse colon descending colon and visualized sigmoid colon is empty collapsed. 5. No ascites or free air.  No abdominal adenopathy. 6. Stable small umbilical hernia containing fat without evidence of acute complication. Electronically Signed   By: Lahoma Crocker M.D.   On: 10/17/2016 10:03    Procedures Procedures (including critical care time)  Medications Ordered in ED Medications  morphine 4 MG/ML  injection 4 mg (4 mg Intravenous Given 10/17/16 0913)  sodium chloride 0.9 % bolus 500 mL (0 mLs Intravenous Stopped 10/17/16 1138)     Initial Impression / Assessment and Plan / ED Course  I have reviewed the triage vital signs and the nursing notes.  Pertinent labs & imaging results that were available during my care of the patient were reviewed by me and considered in my medical decision making (see chart for details).  71 year old male here with diarrhea, new abdominal pain starting today. He is afebrile and nontoxic. Abdomen soft with some tenderness in the suprapubic and left lower quadrant. Does have history of rectal cancer as well as diverticulosis and prior C. difficile. No recent antibiotics use or travel. He did have lab work done with his PCP earlier in the week which showed some mild elevation of his kidney function. We'll plan for labs, CT, given IV fluids and pain medication. Requested stool culture.  Screening lab work is overall reassuring. Patient's creatinine appears around his baseline given his CKD status.  He was given IV and oral hydration here.  CT scan without acute findings aside from likely diarrhea.  Wife has gone home to get patient's stool sample, however lab unable to run C diff specimen as stool is too formed.  Other stool sample was sent for PCR analysis.  Patient has not had any episodes of diarrhea here.  Feel unlikely to be C. Diff given these findings.  Will have him follow-up with his GI doctor, Dr. Loletha Carrow.  Discussed plan with patient, he acknowledged understanding and agreed with plan of care.  Return precautions given for new or worsening symptoms.  Discussed with attending physician, Dr. Venora Maples, who evaluated patient and agrees with assessment and plan of care.  Final Clinical Impressions(s) / ED Diagnoses   Final diagnoses:  Diarrhea, unspecified type    New Prescriptions New Prescriptions   No medications on file     Larene Pickett, PA-C 10/17/16  [place], MD 10/17/16 1550

## 2016-10-17 NOTE — ED Triage Notes (Signed)
Pt complaint of diarrhea for over a week; denies n/v; denies recent antibiotic use.

## 2016-10-18 ENCOUNTER — Encounter: Payer: Self-pay | Admitting: Internal Medicine

## 2016-10-18 DIAGNOSIS — A084 Viral intestinal infection, unspecified: Secondary | ICD-10-CM | POA: Insufficient documentation

## 2016-10-18 LAB — GASTROINTESTINAL PANEL BY PCR, STOOL (REPLACES STOOL CULTURE)
Adenovirus F40/41: NOT DETECTED
Astrovirus: NOT DETECTED
CAMPYLOBACTER SPECIES: NOT DETECTED
CRYPTOSPORIDIUM: NOT DETECTED
CYCLOSPORA CAYETANENSIS: NOT DETECTED
ENTEROTOXIGENIC E COLI (ETEC): NOT DETECTED
Entamoeba histolytica: NOT DETECTED
Enteroaggregative E coli (EAEC): NOT DETECTED
Enteropathogenic E coli (EPEC): NOT DETECTED
Giardia lamblia: NOT DETECTED
Norovirus GI/GII: NOT DETECTED
PLESIMONAS SHIGELLOIDES: NOT DETECTED
ROTAVIRUS A: NOT DETECTED
SAPOVIRUS (I, II, IV, AND V): NOT DETECTED
SHIGA LIKE TOXIN PRODUCING E COLI (STEC): NOT DETECTED
SHIGELLA/ENTEROINVASIVE E COLI (EIEC): NOT DETECTED
Salmonella species: NOT DETECTED
VIBRIO SPECIES: NOT DETECTED
Vibrio cholerae: NOT DETECTED
YERSINIA ENTEROCOLITICA: NOT DETECTED

## 2016-10-18 NOTE — Patient Instructions (Signed)
Viral Gastroenteritis, Adult °Viral gastroenteritis is also known as the stomach flu. This condition is caused by various viruses. These viruses can be passed from person to person very easily (are very contagious). This condition may affect your stomach, small intestine, and large intestine. It can cause sudden watery diarrhea, fever, and vomiting. °Diarrhea and vomiting can make you feel weak and cause you to become dehydrated. You may not be able to keep fluids down. Dehydration can make you tired and thirsty, cause you to have a dry mouth, and decrease how often you urinate. Older adults and people with other diseases or a weak immune system are at higher risk for dehydration. °It is important to replace the fluids that you lose from diarrhea and vomiting. If you become severely dehydrated, you may need to get fluids through an IV tube. °What are the causes? °Gastroenteritis is caused by various viruses, including rotavirus and norovirus. Norovirus is the most common cause in adults. °You can get sick by eating food, drinking water, or touching a surface contaminated with one of these viruses. You can also get sick from sharing utensils or other personal items with an infected person. °What increases the risk? °This condition is more likely to develop in people: °· Who have a weak defense system (immune system). °· Who live with one or more children who are younger than 2 years old. °· Who live in a nursing home. °· Who go on cruise ships. °What are the signs or symptoms? °Symptoms of this condition start suddenly 1-2 days after exposure to a virus. Symptoms may last a few days or as long as a week. The most common symptoms are watery diarrhea and vomiting. Other symptoms include: °· Fever. °· Headache. °· Fatigue. °· Pain in the abdomen. °· Chills. °· Weakness. °· Nausea. °· Muscle aches. °· Loss of appetite. °How is this diagnosed? °This condition is diagnosed with a medical history and physical exam. You may  also have a stool test to check for viruses or other infections. °How is this treated? °This condition typically goes away on its own. The focus of treatment is to restore lost fluids (rehydration). Your health care provider may recommend that you take an oral rehydration solution (ORS) to replace important salts and minerals (electrolytes) in your body. Severe cases of this condition may require giving fluids through an IV tube. °Treatment may also include medicine to help with your symptoms. °Follow these instructions at home: °Follow instructions from your health care provider about how to care for yourself at home. °Eating and drinking °Follow these recommendations as told by your health care provider: °· Take an ORS. This is a drink that is sold at pharmacies and retail stores. °· Drink clear fluids in small amounts as you are able. Clear fluids include water, ice chips, diluted fruit juice, and low-calorie sports drinks. °· Eat bland, easy-to-digest foods in small amounts as you are able. These foods include bananas, applesauce, rice, lean meats, toast, and crackers. °· Avoid fluids that contain a lot of sugar or caffeine, such as energy drinks, sports drinks, and soda. °· Avoid alcohol. °· Avoid spicy or fatty foods. °General instructions °· Drink enough fluid to keep your urine clear or pale yellow. °· Wash your hands often. If soap and water are not available, use hand sanitizer. °· Make sure that all people in your household wash their hands well and often. °· Take over-the-counter and prescription medicines only as told by your health care provider. °· Rest   at home while you recover. °· Watch your condition for any changes. °· Take a warm bath to relieve any burning or pain from frequent diarrhea episodes. °· Keep all follow-up visits as told by your health care provider. This is important. °Contact a health care provider if: °· You cannot keep fluids down. °· Your symptoms get worse. °· You have new  symptoms. °· You feel light-headed or dizzy. °· You have muscle cramps. °Get help right away if: °· You have chest pain. °· You feel extremely weak or you faint. °· You see blood in your vomit. °· Your vomit looks like coffee grounds. °· You have bloody or black stools or stools that look like tar. °· You have a severe headache, a stiff neck, or both. °· You have a rash. °· You have severe pain, cramping, or bloating in your abdomen. °· You have trouble breathing or you are breathing very quickly. °· Your heart is beating very quickly. °· Your skin feels cold and clammy. °· You feel confused. °· You have pain when you urinate. °· You have signs of dehydration, such as: °¨ Dark urine, very little urine, or no urine. °¨ Cracked lips. °¨ Dry mouth. °¨ Sunken eyes. °¨ Sleepiness. °¨ Weakness. °This information is not intended to replace advice given to you by your health care provider. Make sure you discuss any questions you have with your health care provider. °Document Released: 06/23/2005 Document Revised: 12/05/2015 Document Reviewed: 02/27/2015 °Elsevier Interactive Patient Education © 2017 [employer] ° °

## 2016-10-20 ENCOUNTER — Other Ambulatory Visit: Payer: Medicare Other

## 2016-10-20 ENCOUNTER — Telehealth: Payer: Self-pay | Admitting: Gastroenterology

## 2016-10-20 ENCOUNTER — Other Ambulatory Visit: Payer: Self-pay

## 2016-10-20 DIAGNOSIS — R197 Diarrhea, unspecified: Secondary | ICD-10-CM

## 2016-10-20 NOTE — Telephone Encounter (Signed)
Looking at ED record patient had a more formed stool and lab was unable to do C diff. Patient now has a loose stool and will drop off specimen at our lab.

## 2016-10-22 ENCOUNTER — Ambulatory Visit: Payer: Medicare Other | Admitting: Internal Medicine

## 2016-10-22 LAB — CLOSTRIDIUM DIFFICILE EIA: C DIFFICILE TOXINS A+ B, EIA: NEGATIVE

## 2016-10-28 ENCOUNTER — Encounter: Payer: Self-pay | Admitting: Nurse Practitioner

## 2016-10-28 ENCOUNTER — Ambulatory Visit (INDEPENDENT_AMBULATORY_CARE_PROVIDER_SITE_OTHER): Payer: Medicare Other | Admitting: Nurse Practitioner

## 2016-10-28 VITALS — BP 116/60 | HR 78 | Ht 68.0 in | Wt 210.0 lb

## 2016-10-28 DIAGNOSIS — R197 Diarrhea, unspecified: Secondary | ICD-10-CM | POA: Diagnosis not present

## 2016-10-28 NOTE — Progress Notes (Addendum)
HPI: Patient is a 71 year old male with CKD,  history of rectal cancer, stage III,  s/p chemoxrt and resection 2014. Enlarging lung nodule on chest CT Jan 2017. PET April 2017 showed hypermetabolic nodule in lung. 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 CT scans Sept 2017 negative for recurrence in abdomen or pelvis. Remains in clinical remission.    Patient is here for evaluation of diarrhea. Diarrhea started on 4/5, saw PCP on 4/10 , WBC normal. C diff negative. GI Pathogen panel negative. At onset he had a scant amount of bleeding, but that has resolved. Plain abdominal films negative. Diarrhea resolved a few days later but recurred on 4/13 so patient went to ED.  His WBC was still normal. CT abd / pelvis without contrast showed liquid stool in right colon, no other findings. Given IV fluids, told to follow up with [place]. Over the the following 11 days he had solid stools until last night. He woke up around 12 am with an episode of watery stool without blood or associated cramps. He took imodium and slept through the night. No BMs today. He hasn't changed any medications or his diet. Since colon cancer he has taken daily fiber and stools typically formed.    Past Medical History:  Diagnosis Date  . Anemia   . Arthritis   . Chronic renal insufficiency, stage II (mild)   . COPD (chronic obstructive pulmonary disease) ([place])    pt denies  . Diverticulosis   . ED (erectile dysfunction)   . Gout   . History of radiation therapy 02/21/13-03/31/13   rectum 50.4Gy total dose  . Hyperplastic colon polyp   . Hypertension   . rectal ca dx'd 02/01/13   rectal. Radiation and chemotherapy- remains on chemotherapy-next tx. 08-22-13.   . S/P thoracotomy   . Sleep apnea    uses cpap    Patient's surgical history, family medical history, social history, medications and allergies were all reviewed in Epic     Physical Exam: BP 116/60   Pulse 78   Ht '5\' 8"'$  (1.727 m)   Wt 210 lb (95.3 kg)   BMI 31.93 kg/m   GENERAL: well developed black male in NAD PSYCH: :Pleasant, cooperative, normal affect EENT:  conjunctiva pink, mucous membranes moist, neck supple without masses CARDIAC:  RRR, , no peripheral edema PULM: Normal respiratory effort, lungs CTA bilaterally, no wheezing ABDOMEN:  soft, nontender, nondistended, no obvious masses, no hepatomegaly,  normal bowel sounds SKIN:  turgor, no lesions seen Musculoskeletal:  Normal muscle tone, normal strength NEURO: Alert and oriented x 3, no focal neurologic deficits   ASSESSMENT and PLAN:  12. 71 year old male for evaluation of unexplained intermittent diarrhea since 4/5, resolving. Stool studies negative. ED evaluation 4/13 negative. Records reviewed. Non-contrast CT scan showed liquid stool in right colon, no other findings. White count normal. Normal solid stools since ED visit 11 days ago until last night at midnight when he had one watery bowel movement for which he took imodium. No BMs today. Not clear why the recurrent loose stool last night after 11 days of normal BMs. Labs, imaging, and exam are reassuring.  -At this point will watch and wait. I advised patient to call me back if he develops frequent loose stools. If so then will consider a flexible sigmoidoscopy to evaluate for radiation proctitis   2. Hx of rectal cancer,  s/p chemoxrt and resection 2014. Enlarging lung nodule on chest CT Jan 2017. PET April 2017 showed hypermetabolic nodule in lung. 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 CT scans Sept 2017 negative for recurrence in abdomen or pelvis. Remains in clinical remission.  He is up to date on surveillance colonoscopy, last one was Nov 2017. Repeat exam recommended at 5 years.   3. CKD [place] , NP 10/28/2016, 2:43 PM     Thank you for sending this case to me. I have reviewed the entire note, and the outlined plan seems appropriate.   Wilfrid Lund, MD

## 2016-10-28 NOTE — Patient Instructions (Signed)
If you are age 70 or older, your body mass index should be between 23-30. Your Body mass index is 31.93 kg/m. If this is out of the aforementioned range listed, please consider follow up with your Primary Care Provider.  If you are age 76 or younger, your body mass index should be between 19-25. Your Body mass index is 31.93 kg/m. If this is out of the aformentioned range listed, please consider follow up with your Primary Care Provider.   Call us if you have recurrent diarrhea.  Follow up as needed.  Thank you for choosing me and [facility].  Tye Savoy, NP

## 2016-11-06 DIAGNOSIS — N185 Chronic kidney disease, stage 5: Secondary | ICD-10-CM | POA: Diagnosis not present

## 2016-11-06 DIAGNOSIS — N189 Chronic kidney disease, unspecified: Secondary | ICD-10-CM | POA: Diagnosis not present

## 2016-11-06 DIAGNOSIS — N2581 Secondary hyperparathyroidism of renal origin: Secondary | ICD-10-CM | POA: Diagnosis not present

## 2016-11-06 LAB — BASIC METABOLIC PANEL
BUN: 66 mg/dL — AB (ref 4–21)
CREATININE: 4.2 mg/dL — AB (ref 0.6–1.3)
Glucose: 89 mg/dL
POTASSIUM: 3.7 mmol/L (ref 3.4–5.3)
SODIUM: 137 mmol/L (ref 137–147)

## 2016-11-06 LAB — CBC AND DIFFERENTIAL
HCT: 33 % — AB (ref 41–53)
Hemoglobin: 11.2 g/dL — AB (ref 13.5–17.5)
Platelets: 238 10*3/uL (ref 150–399)
WBC: 7 10*3/mL

## 2016-11-06 LAB — VITAMIN D 25 HYDROXY (VIT D DEFICIENCY, FRACTURES): Vit D, 25-Hydroxy: 41.2

## 2016-11-18 DIAGNOSIS — H40033 Anatomical narrow angle, bilateral: Secondary | ICD-10-CM | POA: Diagnosis not present

## 2016-11-18 DIAGNOSIS — H401221 Low-tension glaucoma, left eye, mild stage: Secondary | ICD-10-CM | POA: Diagnosis not present

## 2016-11-18 DIAGNOSIS — H401212 Low-tension glaucoma, right eye, moderate stage: Secondary | ICD-10-CM | POA: Diagnosis not present

## 2016-11-18 LAB — HM DIABETES EYE EXAM

## 2016-11-19 ENCOUNTER — Ambulatory Visit (INDEPENDENT_AMBULATORY_CARE_PROVIDER_SITE_OTHER)
Admission: RE | Admit: 2016-11-19 | Discharge: 2016-11-19 | Disposition: A | Discharge status: Home / Self Care | Payer: Medicare Other | Source: Ambulatory Visit | Attending: Nurse Practitioner | Admitting: Nurse Practitioner

## 2016-11-19 ENCOUNTER — Ambulatory Visit (INDEPENDENT_AMBULATORY_CARE_PROVIDER_SITE_OTHER): Payer: Medicare Other | Admitting: Nurse Practitioner

## 2016-11-19 ENCOUNTER — Telehealth: Payer: Self-pay | Admitting: Internal Medicine

## 2016-11-19 ENCOUNTER — Ambulatory Visit: Payer: Medicare Other | Admitting: Nurse Practitioner

## 2016-11-19 ENCOUNTER — Encounter: Payer: Self-pay | Admitting: Nurse Practitioner

## 2016-11-19 VITALS — BP 136/70 | HR 75 | Temp 98.4°F | Ht 68.0 in | Wt 210.0 lb

## 2016-11-19 DIAGNOSIS — M7989 Other specified soft tissue disorders: Secondary | ICD-10-CM | POA: Diagnosis not present

## 2016-11-19 DIAGNOSIS — S91332A Puncture wound without foreign body, left foot, initial encounter: Secondary | ICD-10-CM | POA: Diagnosis not present

## 2016-11-19 DIAGNOSIS — Z23 Encounter for immunization: Secondary | ICD-10-CM

## 2016-11-19 DIAGNOSIS — M79675 Pain in left toe(s): Secondary | ICD-10-CM

## 2016-11-19 DIAGNOSIS — M19072 Primary osteoarthritis, left ankle and foot: Secondary | ICD-10-CM | POA: Diagnosis not present

## 2016-11-19 MED ORDER — DOXYCYCLINE HYCLATE 100 MG PO TABS: 100.0000 mg | ORAL_TABLET | Freq: Two times a day (BID) | ORAL | 0 refills | Status: AC

## 2016-11-19 NOTE — Telephone Encounter (Signed)
Pt called back. I gave him MD response from X-Ray result. He had no questions.

## 2016-11-19 NOTE — Patient Instructions (Addendum)
X-ray negative for foreign object or osteomyelitis.  Puncture Wound A puncture wound is an injury that is caused by a sharp, thin object that goes through your skin, such as a nail. A puncture wound usually does not leave a large opening in your skin, so it may not bleed a lot. However, when you get a puncture wound, dirt or other materials (foreign bodies) can be forced into your wound and break off inside. This makes it more likely that an infection will happen, such as tetanus. Follow these instructions at home: Medicines   Take or apply over-the-counter and prescription medicines only as told by your doctor.  If you were prescribed an antibiotic medicine, take or apply it as told by your doctor. Do not stop using the antibiotic even if your condition starts to get better. Wound care   There are many ways to close and cover a wound. For example, a wound can be covered with stitches (sutures), skin glue, or adhesive strips. Follow instructions from your doctor about:  How to take care of your wound.  When and how you should change your bandage (dressing).  When you should remove your bandage.  Removing whatever was used to close your wound.  Keep the bandage dry as told by your doctor. Do not take baths, swim, use a hot tub, or do anything that would put your wound underwater until your doctor says it is okay.  Clean the wound as told by your doctor.  Do not scratch or pick at the wound.  Check your wound every day for signs of infection. Watch for:  Redness, swelling, or pain.  Fluid, blood, or pus. General instructions   Raise (elevate) the injured area above the level of your heart while you are sitting or lying down.  If your puncture wound is in your foot, ask your doctor if you need to avoid putting weight on your foot and for how long.  Keep all follow-up visits as told by your doctor. This is important. Contact a doctor if:  You got a tetanus shot and you have any  of these problems at the injection site:  Swelling.  Very bad pain.  Redness.  Bleeding.  You have a fever.  Your stitches come out.  You notice a bad smell coming from your wound or your bandage.  You notice something coming out of the wound, such as wood or glass.  Medicine does not help your pain.  You have more redness, swelling, or pain at the site of your wound.  You have fluid, blood, or pus coming from your wound.  You notice a change in the color of your skin near your wound.  You need to change the bandage often because fluid, blood, or pus is coming from the wound.  You start to have a new rash.  You start to have numbness around the wound. Get help right away if:  You have very bad swelling around the wound.  Your pain suddenly gets worse and is very bad.  You start to get painful skin lumps.  You have a red streak going away from your wound.  The wound is on your hand or foot and you cannot move a finger or toe like you usually can.  The wound is on your hand or foot and you notice that your fingers or toes look pale or bluish. This information is not intended to replace advice given to you by your health care provider. Make sure you  discuss any questions you have with your health care provider. Document Released: 04/01/2008 Document Revised: 11/29/2015 Document Reviewed: 08/16/2014 Elsevier Interactive Patient Education  2017 [employer].

## 2016-11-19 NOTE — Progress Notes (Signed)
Subjective:  Patient ID: Jason Cotta Sr., male    DOB: 1945/10/19  Age: 71 y.o. MRN: [id number]  CC: Foot Pain (left foot pain,swelling, step on a nail 2 wks ago. )   Wound Check  He was originally treated more than 14 days ago (puncture wound to left foot while working on roof). Previous treatment included wound cleansing or irrigation. His temperature was unmeasured prior to arrival. There has been no drainage from the wound. The redness has worsened. The swelling has worsened. The pain has worsened. There is difficulty moving the extremity or digit due to pain.    Outpatient Medications Prior to Visit  Medication Sig Dispense Refill  . allopurinol (ZYLOPRIM) 300 MG tablet TAKE 300 MG BY MOUTH ONCE  DAILY 90 tablet 1  . BYSTOLIC 5 MG tablet TAKE ONE TABLET BY MOUTH ONCE DAILY 30 tablet 5  . chlorthalidone (HYGROTON) 25 MG tablet Take 1 tablet (25 mg total) by mouth daily. 90 tablet 1  . doxazosin (CARDURA) 8 MG tablet Take 8 mg by mouth daily.    . furosemide (LASIX) 80 MG tablet Take 1 tablet (80 mg total) by mouth daily. 90 tablet 3  . gabapentin (NEURONTIN) 300 MG capsule Take 300 mg by mouth 3 (three) times daily. Take '300mg'$ s morning and '600mg'$ s at bedtime    . KLOR-CON M20 20 MEQ tablet Take 1 tablet (20 mEq total) by mouth daily. 90 tablet 3  . loperamide (IMODIUM) 2 MG capsule Take 2 mg by mouth as needed for diarrhea or loose stools.    . polycarbophil (FIBERCON) 625 MG tablet Take 1,250 mg by mouth daily.     No facility-administered medications prior to visit.     ROS See HPI  Objective:  BP 136/70   Pulse 75   Temp 98.4 F (36.9 C)   Ht '5\' 8"'$  (1.727 m)   Wt 210 lb (95.3 kg)   SpO2 99%   BMI 31.93 kg/m   BP Readings from Last 3 Encounters:  11/19/16 136/70  10/28/16 116/60  10/17/16 (!) 154/78    Wt Readings from Last 3 Encounters:  11/19/16 210 lb (95.3 kg)  10/28/16 210 lb (95.3 kg)  10/17/16 209 lb (94.8 kg)    Physical Exam  Constitutional: He is  oriented to person, place, and time. No distress.  Cardiovascular: Normal rate.   Pulmonary/Chest: Effort normal.  Musculoskeletal: He exhibits edema and tenderness.       Left foot: There is tenderness, bony tenderness, swelling and laceration. There is normal range of motion.       Feet:  Neurological: He is alert and oriented to person, place, and time.  Skin: Skin is warm and dry. There is erythema.  Vitals reviewed.   Lab Results  Component Value Date   WBC 5.9 10/17/2016   HGB 11.1 (L) 10/17/2016   HCT 34.2 (L) 10/17/2016   PLT 202 10/17/2016   GLUCOSE 111 (H) 10/17/2016   CHOL 208 (H) 06/04/2015   TRIG 203.0 (H) 06/04/2015   HDL 34.80 (L) 06/04/2015   LDLDIRECT 120.0 06/04/2015   LDLCALC 131 (H) 05/29/2010   ALT 14 (L) 10/17/2016   AST 18 10/17/2016   NA 138 10/17/2016   K 3.4 (L) 10/17/2016   CL 104 10/17/2016   CREATININE 4.38 (H) 10/17/2016   BUN 72 (H) 10/17/2016   CO2 23 10/17/2016   TSH 1.90 08/22/2015   PSA 1.86 08/10/2013   INR 1.11 11/16/2015   HGBA1C 6.0 12/20/2009  Ct Abdomen Pelvis Wo Contrast  Result Date: 10/17/2016 CLINICAL DATA:  Diarrhea for 1 week, lower abdominal pain starting this morning, weight loss, history of rectal cancer, chemotherapy 3 years ago EXAM: CT ABDOMEN AND PELVIS WITHOUT CONTRAST TECHNIQUE: Multidetector CT imaging of the abdomen and pelvis was performed following the standard protocol without IV contrast. COMPARISON:  CT scan 09/30/2016 and PET scan 10/22/2015 FINDINGS: Lower chest: The lung bases shows no acute findings Hepatobiliary: Unenhanced liver shows no biliary ductal dilatation. No calcified gallstones are noted within nondistended gallbladder. Pancreas: Unremarkable. No pancreatic ductal dilatation or surrounding inflammatory changes. Spleen: Normal in size without focal abnormality. Adrenals/Urinary Tract: No adrenal gland mass. Unenhanced kidneys are symmetrical in size. Stable slight hyperdense lesion in mid/lower  interpolar region of the right kidney measures 2.9 cm. Stable exophytic hyperdense lesion in lower pole anterior aspect of the right kidney measures 1.1 cm. Stable hyperdense lesion interpolar region of the left kidney Measures 1.7 cm. Given stability this probable represent proteinaceous or hemorrhagic cysts. Continuous surveillance is recommended. No nephrolithiasis. No hydronephrosis or hydroureter. Stomach/Bowel: There is no gastric outlet obstruction. No thickened or dilated small bowel loops. Again noted colonic diverticulum in right colon transverse colon and descending colon. Some liquid stool noted in right colon. This is suspicious for diarrhea. No thickening of colonic wall to suggest acute colitis. No acute diverticulitis. Normal appendix partially visualized in axial image 64. The transverse colon descending colon and visualized sigmoid colon is empty collapsed. Vascular/Lymphatic: Atherosclerotic calcifications of abdominal aorta and iliac arteries. No aortic aneurysm. Reproductive: Prostate gland and seminal vesicles are unremarkable. No pelvic mass or adenopathy. Other: No ascites or free abdominal air. No inguinal adenopathy. Stable umbilical hernia containing fat. No evidence of acute complication. The urinary bladder is unremarkable. Musculoskeletal: No destructive bony lesions are noted. Sagittal images of the spine shows stable degenerative changes lower thoracic and lumbar spine. Mild degenerative changes bilateral SI joints. IMPRESSION: 1. Stable bilateral renal lesions. This probable represents proteinaceous or hemorrhagic cysts given stability. However attention on follow-up study is recommended. No nephrolithiasis. No hydronephrosis or hydroureter. 2. No small bowel obstruction. 3. No pericecal inflammation.  Normal appendix. 4. There is some liquid stool in right colon. Diarrhea cannot be excluded. Multiple colonic diverticula are stable. No evidence of acute colitis or diverticulitis.  Limited study without IV and oral contrast. The transverse colon descending colon and visualized sigmoid colon is empty collapsed. 5. No ascites or free air.  No abdominal adenopathy. 6. Stable small umbilical hernia containing fat without evidence of acute complication. Electronically Signed   By: Lahoma Crocker M.D.   On: 10/17/2016 10:03    Assessment & Plan:   Faraz was seen today for foot pain.  Diagnoses and all orders for this visit:  Puncture wound of left foot, initial encounter -     DG Foot Complete Left; Future -     doxycycline (VIBRA-TABS) 100 MG tablet; Take 1 tablet (100 mg total) by mouth 2 (two) times daily.  Pain and swelling of toe of left foot -     DG Foot Complete Left; Future -     doxycycline (VIBRA-TABS) 100 MG tablet; Take 1 tablet (100 mg total) by mouth 2 (two) times daily.  Need for diphtheria-tetanus-pertussis (Tdap) vaccine -     Tdap vaccine greater than or equal to 7yo IM   I am having Mr. Morten start on doxycycline. I am also having him maintain his gabapentin, KLOR-CON M20, furosemide, loperamide, polycarbophil, chlorthalidone,  BYSTOLIC, allopurinol, doxazosin, and Acetaminophen (TYLENOL 8 HOUR PO).  Meds ordered this encounter  Medications  . Acetaminophen (TYLENOL 8 HOUR PO)    Sig: Take by mouth.  . doxycycline (VIBRA-TABS) 100 MG tablet    Sig: Take 1 tablet (100 mg total) by mouth 2 (two) times daily.    Dispense:  14 tablet    Refill:  0    Order Specific Question:   Supervising Provider    Answer:   Cassandria Anger [[id number]]    Follow-up: Return if symptoms worsen or fail to improve.  Wilfred Lacy, NP

## 2016-11-20 DIAGNOSIS — D631 Anemia in chronic kidney disease: Secondary | ICD-10-CM | POA: Diagnosis not present

## 2016-11-20 DIAGNOSIS — N2581 Secondary hyperparathyroidism of renal origin: Secondary | ICD-10-CM | POA: Diagnosis not present

## 2016-11-20 DIAGNOSIS — N185 Chronic kidney disease, stage 5: Secondary | ICD-10-CM | POA: Diagnosis not present

## 2016-11-20 DIAGNOSIS — I10 Essential (primary) hypertension: Secondary | ICD-10-CM | POA: Diagnosis not present

## 2016-11-25 ENCOUNTER — Encounter: Payer: Self-pay | Admitting: Internal Medicine

## 2016-12-03 DIAGNOSIS — N185 Chronic kidney disease, stage 5: Secondary | ICD-10-CM | POA: Diagnosis not present

## 2016-12-03 DIAGNOSIS — N2581 Secondary hyperparathyroidism of renal origin: Secondary | ICD-10-CM | POA: Diagnosis not present

## 2016-12-29 DIAGNOSIS — G4733 Obstructive sleep apnea (adult) (pediatric): Secondary | ICD-10-CM | POA: Diagnosis not present

## 2017-01-29 ENCOUNTER — Ambulatory Visit: Payer: Medicare Other | Admitting: Oncology

## 2017-01-29 ENCOUNTER — Other Ambulatory Visit: Payer: Medicare Other

## 2017-02-02 ENCOUNTER — Telehealth: Payer: Self-pay | Admitting: Oncology

## 2017-02-02 NOTE — Telephone Encounter (Signed)
Call day - moved 8/2 appointments to 8/27. Spoke with patient.

## 2017-02-03 ENCOUNTER — Telehealth: Payer: Self-pay

## 2017-02-03 NOTE — Telephone Encounter (Signed)
Call placed to pt at his request to confirm the times of his appointment on 8/27. Pt verbalizes understanding.

## 2017-02-05 ENCOUNTER — Other Ambulatory Visit: Payer: Medicare Other

## 2017-02-05 ENCOUNTER — Ambulatory Visit: Payer: Medicare Other | Admitting: Oncology

## 2017-02-14 DIAGNOSIS — N2581 Secondary hyperparathyroidism of renal origin: Secondary | ICD-10-CM | POA: Diagnosis not present

## 2017-02-14 DIAGNOSIS — N185 Chronic kidney disease, stage 5: Secondary | ICD-10-CM | POA: Diagnosis not present

## 2017-02-14 DIAGNOSIS — N189 Chronic kidney disease, unspecified: Secondary | ICD-10-CM | POA: Diagnosis not present

## 2017-02-24 DIAGNOSIS — I10 Essential (primary) hypertension: Secondary | ICD-10-CM | POA: Diagnosis not present

## 2017-02-24 DIAGNOSIS — N185 Chronic kidney disease, stage 5: Secondary | ICD-10-CM | POA: Diagnosis not present

## 2017-02-24 DIAGNOSIS — D631 Anemia in chronic kidney disease: Secondary | ICD-10-CM | POA: Diagnosis not present

## 2017-02-24 DIAGNOSIS — N2581 Secondary hyperparathyroidism of renal origin: Secondary | ICD-10-CM | POA: Diagnosis not present

## 2017-02-24 LAB — BASIC METABOLIC PANEL
BUN: 32 — AB (ref 4–21)
CREATININE: 3.2 — AB (ref 0.6–1.3)
GLUCOSE: 98
POTASSIUM: 4.3 (ref 3.4–5.3)
SODIUM: 140 (ref 137–147)

## 2017-02-24 LAB — CBC AND DIFFERENTIAL
HCT: 36 — AB (ref 41–53)
HEMOGLOBIN: 10.9 — AB (ref 13.5–17.5)
Platelets: 255 (ref 150–399)
WBC: 5.5

## 2017-02-25 ENCOUNTER — Encounter: Payer: Self-pay | Admitting: Internal Medicine

## 2017-02-25 NOTE — Progress Notes (Signed)
Abstracted and sent to scan  

## 2017-03-02 ENCOUNTER — Telehealth: Payer: Self-pay | Admitting: Oncology

## 2017-03-02 ENCOUNTER — Other Ambulatory Visit (HOSPITAL_BASED_OUTPATIENT_CLINIC_OR_DEPARTMENT_OTHER): Payer: Medicare Other

## 2017-03-02 ENCOUNTER — Ambulatory Visit (HOSPITAL_BASED_OUTPATIENT_CLINIC_OR_DEPARTMENT_OTHER): Payer: Medicare Other | Admitting: Oncology

## 2017-03-02 VITALS — BP 150/74 | HR 82 | Temp 97.7°F | Resp 18 | Ht 68.0 in | Wt 215.9 lb

## 2017-03-02 DIAGNOSIS — I10 Essential (primary) hypertension: Secondary | ICD-10-CM | POA: Diagnosis not present

## 2017-03-02 DIAGNOSIS — Z85048 Personal history of other malignant neoplasm of rectum, rectosigmoid junction, and anus: Secondary | ICD-10-CM

## 2017-03-02 DIAGNOSIS — C20 Malignant neoplasm of rectum: Secondary | ICD-10-CM

## 2017-03-02 DIAGNOSIS — N289 Disorder of kidney and ureter, unspecified: Secondary | ICD-10-CM | POA: Diagnosis not present

## 2017-03-02 DIAGNOSIS — R197 Diarrhea, unspecified: Secondary | ICD-10-CM | POA: Diagnosis not present

## 2017-03-02 NOTE — Telephone Encounter (Signed)
Scheduled appt per 8/27 los - Gave patient AVS and calender per los.

## 2017-03-02 NOTE — Progress Notes (Signed)
Jason [name] OFFICE PROGRESS NOTE   Diagnosis: Rectal cancer  INTERVAL HISTORY:   Jason [name] returns as scheduled. He feels well. Good appetite. He has intermittent diarrhea, relieved with Imodium. He is followed by Dr. Posey Pronto for renal insufficiency.   Objective:  Vital signs in last 24 hours:  Blood pressure (!) 150/74, pulse 82, temperature 97.7 F (36.5 C), temperature source Oral, resp. rate 18, height 5\' 8"  (1.727 m), weight 215 lb 14.4 oz (97.9 kg), SpO2 100 %.    HEENT: Neck without mass Lymphatics: No cervical, supraclavicular, axillary, or inguinal nodes Resp: Lungs clear bilaterally Cardio: Regular rate and rhythm GI: No hepatosplenomegaly, no mass, nontender Vascular: Trace-1+ pitting edema at the left greater than right lower leg and ankle   Lab Results:   Lab Results  Component Value Date   CEA1 1.23 07/28/2016   CEA1 1.8 07/28/2016     Medications: I have reviewed the patient's current medications.  Assessment/Plan: 1. Rectal cancer, clinical stage III (uT3, uN2).  Initiation of concurrent Xeloda and radiation on 02/21/2013, completed 03/31/2013.   CEA normal 04/25/2013   Status post laparoscopic low anterior resection with diverting loop ileostomy 05/23/2013. Final pathology showed a 3.2 cm invasive adenocarcinoma with neoadjuvant related change. Tumor invaded into perirectal soft tissue. There was no lymphovascular or perineural invasion. 14 lymph nodes were negative for tumor. Surgical margins were negative. There was one hyperplastic polyp (ypT3, pN0).   Initiation of weekly 5-FU/leucovorin 08/01/2013   Ileostomy takedown 08/24/2013.   Weekly 5-FU/leucovorin resumed 09/22/2013.  Restaging CTs 02/20/2014 negative for evidence of recurrent disease  restaging CT scans 04/05/2015 negative for evidence of recurrent disease  Surveillance colonoscopy 03/31/2014, status post removal of of a tubular adenoma from the ascending  colon  Enlarging left posterior medial upper lobe nodule on chest CT 04/05/2015 and 07/12/2015  PET scan 10/22/2015 with a hypermetabolic nodule in the medial laxative upper lobe, other lung nodules stable  Left lung wedge resection 11/20/2015 confirmed a 1 cm focus of metastatic adenocarcinoma of colorectal origin, resection margins and multiple lymph nodes negative  CT scans 03/17/2016-bandlike scarring along the left upper lobe nodule resection with a soft tissue density component; calcified bilateral pleural plaques; no findings of recurrence of the abdomen or pelvis.  CTs 09/30/2016-negative for progressive disease  2. Markedly elevated pretreatment CEA 3. Indeterminate pulmonary/subpleural nodules and right hepatic dome lesion. Unchanged on the CT 04/25/2013, unchanged nodular lung lesions on the CT 02/20/2014, see follow-up CTs/PET above 4. Renal insufficiency. Acute on chronic renal failure during hospitalization November/December 2014. Improved following the ileostomy takedown. 5. History of Anemia secondary chemotherapy, chronic disease, and renal insufficiency 6. Hypertension. 7. Gout. 8. Prolonged postoperative ileus following laparoscopic low anterior resection with diverting loop ileostomy 05/23/2013. 9. C. difficile-positive 09/08/2013-status post Flagyl with improvement. Recurrent diarrhea following completion of Flagyl. Stool positive for C. difficile on 09/24/2013. He completed treatment with vancomycin. The diarrhea resolved. 10. Left greater than right low leg edema. Venous Doppler 09/26/2013 negative for left leg DVT. 11. Rectal urgency/incontinence-Improved with participation in the pelvic physical therapy clinic.  Disposition:  Jason [name] remains in clinical remission from rectal cancer. We will follow-up on the CEA from today. He will be scheduled for restaging CTs in November. He will return for an office visit in 6 months.  He will seek medical attention for  increased edema, pain, or erythema in the left leg. I have a low clinical suspicion for a deep vein thrombosis.  15 minutes were  spent with the patient today. The majority of the time was used for counseling and coordination of care.  Donneta Romberg, MD  03/02/2017  4:29 PM

## 2017-03-03 ENCOUNTER — Other Ambulatory Visit: Payer: Self-pay | Admitting: Internal Medicine

## 2017-03-03 DIAGNOSIS — I10 Essential (primary) hypertension: Secondary | ICD-10-CM

## 2017-03-03 LAB — CEA (IN HOUSE-CHCC): CEA (CHCC-In House): <1 ng/mL (ref 0.00–5.00)

## 2017-03-05 ENCOUNTER — Telehealth: Payer: Self-pay

## 2017-03-05 NOTE — Telephone Encounter (Signed)
-----   Message from [name], MD sent at 03/03/2017  6:57 PM EDT ----- Please call patient, cea is normal

## 2017-03-05 NOTE — Telephone Encounter (Signed)
Called patient and informed him that his CEA was normal per MD Benay Spice. Pt verbalizes understanding and is appreciative of call back.
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
## 2017-03-07 ENCOUNTER — Ambulatory Visit (INDEPENDENT_AMBULATORY_CARE_PROVIDER_SITE_OTHER): Payer: Medicare Other | Admitting: Internal Medicine

## 2017-03-07 ENCOUNTER — Encounter: Payer: Self-pay | Admitting: Internal Medicine

## 2017-03-07 VITALS — BP 146/88 | HR 81 | Temp 99.3°F | Wt 214.0 lb

## 2017-03-07 DIAGNOSIS — M25472 Effusion, left ankle: Secondary | ICD-10-CM | POA: Diagnosis not present

## 2017-03-07 DIAGNOSIS — R609 Edema, unspecified: Secondary | ICD-10-CM

## 2017-03-07 DIAGNOSIS — M7989 Other specified soft tissue disorders: Secondary | ICD-10-CM | POA: Diagnosis not present

## 2017-03-07 MED ORDER — DOXYCYCLINE HYCLATE 100 MG PO TABS: 100.0000 mg | ORAL_TABLET | Freq: Two times a day (BID) | ORAL | 0 refills | Status: DC

## 2017-03-07 NOTE — Patient Instructions (Addendum)
Not sure  Why you have  More swelling on left ankle foot  Could be arthritis   If getting red and  More painful then can add antibiotic  Medication. otherwise do not take .  Otherwise  Elevate foot over the holiday  Take and extra dose of lasix today .   Get x ray of ankle here on Tuesday  .  If not getting better make appt with dr Ronnald Ramp  PCP during day hours .

## 2017-03-10 ENCOUNTER — Ambulatory Visit (INDEPENDENT_AMBULATORY_CARE_PROVIDER_SITE_OTHER)
Admission: RE | Admit: 2017-03-10 | Discharge: 2017-03-10 | Disposition: A | Discharge status: Home / Self Care | Payer: Medicare Other | Source: Ambulatory Visit | Attending: Internal Medicine | Admitting: Internal Medicine

## 2017-03-10 DIAGNOSIS — R609 Edema, unspecified: Secondary | ICD-10-CM | POA: Diagnosis not present

## 2017-03-10 DIAGNOSIS — M7989 Other specified soft tissue disorders: Secondary | ICD-10-CM | POA: Diagnosis not present

## 2017-03-10 DIAGNOSIS — M25472 Effusion, left ankle: Secondary | ICD-10-CM

## 2017-03-10 DIAGNOSIS — M25572 Pain in left ankle and joints of left foot: Secondary | ICD-10-CM | POA: Diagnosis not present

## 2017-03-19 DIAGNOSIS — M65872 Other synovitis and tenosynovitis, left ankle and foot: Secondary | ICD-10-CM | POA: Diagnosis not present

## 2017-03-19 DIAGNOSIS — M25572 Pain in left ankle and joints of left foot: Secondary | ICD-10-CM | POA: Diagnosis not present

## 2017-03-21 ENCOUNTER — Other Ambulatory Visit: Payer: Self-pay | Admitting: Internal Medicine

## 2017-03-21 DIAGNOSIS — I10 Essential (primary) hypertension: Secondary | ICD-10-CM

## 2017-03-23 ENCOUNTER — Other Ambulatory Visit: Payer: Self-pay | Admitting: Internal Medicine

## 2017-03-23 DIAGNOSIS — I10 Essential (primary) hypertension: Secondary | ICD-10-CM

## 2017-03-24 ENCOUNTER — Telehealth: Payer: Self-pay | Admitting: Internal Medicine

## 2017-03-24 DIAGNOSIS — I10 Essential (primary) hypertension: Secondary | ICD-10-CM

## 2017-03-25 ENCOUNTER — Other Ambulatory Visit: Payer: Self-pay | Admitting: Internal Medicine

## 2017-03-25 DIAGNOSIS — I10 Essential (primary) hypertension: Secondary | ICD-10-CM

## 2017-03-25 MED ORDER — DOXAZOSIN MESYLATE 8 MG PO TABS: 8.0000 mg | ORAL_TABLET | Freq: Every day | ORAL | 1 refills | Status: DC

## 2017-03-25 NOTE — Telephone Encounter (Signed)
The patient would like to know why this cannot be refilled, please advise.

## 2017-04-08 DIAGNOSIS — M25572 Pain in left ankle and joints of left foot: Secondary | ICD-10-CM | POA: Diagnosis not present

## 2017-04-08 DIAGNOSIS — M65872 Other synovitis and tenosynovitis, left ankle and foot: Secondary | ICD-10-CM | POA: Diagnosis not present

## 2017-04-09 NOTE — Progress Notes (Addendum)
Subjective:   Jason Cotta Sr. is a 71 y.o. male who presents for an Initial Medicare Annual Wellness Visit.  Review of Systems  No ROS.  Medicare Wellness Visit. Additional risk factors are reflected in the social history.    Sleep patterns: feels rested on waking, gets up 1-2 times nightly to void and sleeps 7-8 hours nightly.    Home Safety/Smoke Alarms: Feels safe in home. Smoke alarms in place.  Living environment; residence and Firearm Safety: 1-story house/ trailer, no firearms.Lives with wife, no needs for DME, good support system Seat Belt Safety/Bike Helmet: Wears seat belt.     Objective:    There were no vitals filed for this visit. There is no height or weight on file to calculate BMI.  Current Medications (verified) Outpatient Encounter Prescriptions as of 04/10/2017  Medication Sig  . Acetaminophen (TYLENOL 8 HOUR PO) Take by mouth.  Marland Kitchen allopurinol (ZYLOPRIM) 300 MG tablet TAKE 1 TABLET BY MOUTH  DAILY  . doxazosin (CARDURA) 8 MG tablet Take 1 tablet (8 mg total) by mouth daily.  Marland Kitchen doxycycline (VIBRA-TABS) 100 MG tablet Take 1 tablet (100 mg total) by mouth 2 (two) times daily. If needed for infection  . furosemide (LASIX) 80 MG tablet Take 1 tablet (80 mg total) by mouth daily.  Marland Kitchen gabapentin (NEURONTIN) 300 MG capsule Take 300 mg by mouth 3 (three) times daily. Take 300mg s morning and 600mg s at bedtime  . KLOR-CON M20 20 MEQ tablet TAKE 1 TABLET BY MOUTH  DAILY  . loperamide (IMODIUM) 2 MG capsule Take 2 mg by mouth as needed for diarrhea or loose stools.  . nebivolol (BYSTOLIC) 10 MG tablet Take 10 mg by mouth daily.  . polycarbophil (FIBERCON) 625 MG tablet Take 1,250 mg by mouth daily.   No facility-administered encounter medications on file as of 04/10/2017.     Allergies (verified) Nsaids and Amlodipine   History: Past Medical History:  Diagnosis Date  . Anemia   . Arthritis   . Chronic renal insufficiency, stage II (mild)   . COPD (chronic  obstructive pulmonary disease) ([place])    pt denies  . Diverticulosis   . ED (erectile dysfunction)   . Gout   . History of radiation therapy 02/21/13-03/31/13   rectum 50.4Gy total dose  . Hyperplastic colon polyp   . Hypertension   . rectal ca dx'd 02/01/13   rectal. Radiation and chemotherapy- remains on chemotherapy-next tx. 08-22-13.   . S/P thoracotomy   . Sleep apnea    uses cpap   Past Surgical History:  Procedure Laterality Date  . AV FISTULA PLACEMENT Left 03/24/2016   Procedure: CREATION OF LEFT BRACIOCEPHALIC ARTERIOVENOUS (AV) FISTULA;  Surgeon: Elam Dutch, MD;  Location: [facility];  Service: Vascular;  Laterality: Left;  . COLON SURGERY  05/20/2013  . COLONOSCOPY  2010   [facility]  . EUS N/A 02/03/2013   Procedure: LOWER ENDOSCOPIC ULTRASOUND (EUS);  Surgeon: Milus Banister, MD;  Location: [facility];  Service: Endoscopy;  Laterality: N/A;  . ILEOSTOMY CLOSURE N/A 08/24/2013   Procedure: CLOSURE OF LOOP ILEOSTOMY ;  Surgeon: Leighton Ruff, MD;  Location: [facility];  Service: General;  Laterality: N/A;  . LAPAROSCOPIC LOW ANTERIOR RESECTION N/A 05/20/2013   Procedure: LAPAROSCOPIC LOW ANTERIOR RESECTION WITH SPLENIC FLEXURE MOBILIZATION;  Surgeon: Leighton Ruff, MD;  Location: [facility];  Service: General;  Laterality: N/A;  . OSTOMY N/A 05/20/2013   Procedure: diverting OSTOMY;  Surgeon: Leighton Ruff, MD;  Location:  [facility];  Service: General;  Laterality: N/A;  . VIDEO ASSISTED THORACOSCOPY (VATS)/WEDGE RESECTION Left 11/20/2015   Procedure: VIDEO ASSISTED THORACOSCOPY (VATS)/LUNG RESECTION;  Surgeon: [name], MD;  Location: [facility];  Service: Thoracic;  Laterality: Left;  Marland Kitchen VIDEO BRONCHOSCOPY N/A 11/20/2015   Procedure: VIDEO BRONCHOSCOPY;  Surgeon: [name], MD;  Location: [facility];  Service: Thoracic;  Laterality: N/A;   Family History  Problem Relation Age of Onset  . Stroke Mother   . Hypertension Mother   . Stroke Father   . Hypertension Father   .  Hypertension Other   . Colon cancer Neg Hx    Social History   Occupational History  .      retired Insurance account manager   Social History Main Topics  . Smoking status: Former Smoker    Packs/day: 0.50    Years: 10.00    Types: Cigarettes    Quit date: 07/07/1977  . Smokeless tobacco: Never Used  . Alcohol use 0.0 oz/week     Comment: drinks occasionally  . Drug use: No  . Sexual activity: Not on file   Tobacco Counseling Counseling given: Not Answered   Activities of Daily Living No flowsheet data found.  Immunizations and Health Maintenance Immunization History  Administered Date(s) Administered  . Influenza Whole 05/15/2009, 06/05/2010  . Influenza, High Dose Seasonal PF 04/27/2015, 04/29/2016  . Influenza,inj,Quad PF,6+ Mos 07/14/2013, 04/10/2014  . Pneumococcal Conjugate-13 06/04/2015  . Pneumococcal Polysaccharide-23 07/20/2012, 08/26/2013  . Td 07/07/1997, 04/27/2008  . Tdap 11/19/2016   Health Maintenance Due  Topic Date Due  . Hepatitis C Screening  1945-09-19  . INFLUENZA VACCINE  02/04/2017    Patient Care Team: [name], MD as PCP - General (Internal Medicine) Lafayette Dragon, MD (Inactive) as Attending Physician (Gastroenterology) Leighton Ruff, MD as Consulting Physician (General Surgery) [name], MD as Consulting Physician (Oncology) Elmarie Shiley, MD as Consulting Physician (Nephrology)  Indicate any recent Medical Services you may have received from other than [facility] providers in the past year (date may be approximate).    Assessment:   This is a routine wellness examination for Jason [name]. Physical assessment deferred to PCP.   Hearing/Vision screen No exam data present  Dietary issues and exercise activities discussed:   Diet (meal preparation, eat out, water intake, caffeinated beverages, dairy products, fruits and vegetables): in general, a "healthy" diet  , well balanced, reports maintaining a renal diet with fluid restriction of 64  ounces daily.      Goals    None     Depression Screen PHQ 2/9 Scores 10/14/2016 06/04/2015 08/17/2014 08/10/2013  PHQ - 2 Score 0 0 0 0    Fall Risk Fall Risk  10/14/2016 06/04/2015 08/17/2014 05/29/2014 02/27/2014  Falls in the past year? No No No No No    Cognitive Function:        Screening Tests Health Maintenance  Topic Date Due  . Hepatitis C Screening  [birth year]  . INFLUENZA VACCINE  02/04/2017  . COLONOSCOPY  05/27/2021  . TETANUS/TDAP  11/20/2026  . PNA vac Low Risk Adult  Completed        Plan:     I have personally reviewed and noted the following in the patient's chart:   . Medical and social history . Use of alcohol, tobacco or illicit drugs  . Current medications and supplements . Functional ability and status . Nutritional status . Physical activity . Advanced directives . List of other  physicians . Vitals . Screenings to include cognitive, depression, and falls . Referrals and appointments  In addition, I have reviewed and discussed with patient certain preventive protocols, quality metrics, and best practice recommendations. A written personalized care plan for preventive services as well as general preventive health recommendations were provided to patient.     Michiel Cowboy, RN   04/09/2017    Medical screening examination/treatment/procedure(s) were performed by non-physician practitioner and as supervising physician I was immediately available for consultation/collaboration. I agree with above. Lew Dawes, MD

## 2017-04-09 NOTE — Progress Notes (Signed)
Pre visit review using our clinic review tool, if applicable. No additional management support is needed unless otherwise documented below in the visit note. 

## 2017-04-10 ENCOUNTER — Other Ambulatory Visit: Payer: Medicare Other

## 2017-04-10 ENCOUNTER — Ambulatory Visit (INDEPENDENT_AMBULATORY_CARE_PROVIDER_SITE_OTHER): Payer: Medicare Other | Admitting: *Deleted

## 2017-04-10 VITALS — BP 134/82 | HR 81 | Resp 20 | Ht 68.0 in | Wt 218.0 lb

## 2017-04-10 DIAGNOSIS — Z1159 Encounter for screening for other viral diseases: Secondary | ICD-10-CM | POA: Diagnosis not present

## 2017-04-10 DIAGNOSIS — Z9189 Other specified personal risk factors, not elsewhere classified: Secondary | ICD-10-CM | POA: Diagnosis not present

## 2017-04-10 DIAGNOSIS — Z0000 Encounter for general adult medical examination without abnormal findings: Secondary | ICD-10-CM | POA: Diagnosis not present

## 2017-04-10 DIAGNOSIS — Z23 Encounter for immunization: Secondary | ICD-10-CM | POA: Diagnosis not present

## 2017-04-10 NOTE — Patient Instructions (Signed)
Continue doing brain stimulating activities (puzzles, reading, adult coloring books, staying active) to keep memory sharp.    Jason [name] , Thank you for taking time to come for your Medicare Wellness Visit. I appreciate your ongoing commitment to your health goals. Please review the following plan we discussed and let me know if I can assist you in the future.   These are the goals we discussed: Goals    . Stay as healthy as I can be          Continue to follow my renal diet and exercise, enjoy life, family, and fish as much as possible.       This is a list of the screening recommended for you and due dates:  Health Maintenance  Topic Date Due  .  Hepatitis C: One time screening is recommended by Center for Disease Control  (CDC) for  adults born from [birth year] through 1964.   [birth year]  . Flu Shot  02/04/2017  . Colon Cancer Screening  05/27/2021  . Tetanus Vaccine  11/20/2026  . Pneumonia vaccines  Completed   Influenza Virus Vaccine injection What is this medicine? INFLUENZA VIRUS VACCINE (in floo EN zuh VAHY ruhs vak SEEN) helps to reduce the risk of getting influenza also known as the flu. The vaccine only helps protect you against some strains of the flu. This medicine may be used for other purposes; ask your health care provider or pharmacist if you have questions. COMMON BRAND NAME(S): Afluria, Agriflu, Alfuria, FLUAD, Fluarix, Fluarix Quadrivalent, Flublok, Flublok Quadrivalent, FLUCELVAX, Flulaval, Fluvirin, Fluzone, Fluzone High-Dose, Fluzone Intradermal What should I tell my health care provider before I take this medicine? They need to know if you have any of these conditions: -bleeding disorder like hemophilia -fever or infection -Guillain-Barre syndrome or other neurological problems -immune system problems -infection with the human immunodeficiency virus (HIV) or AIDS -low blood platelet counts -multiple sclerosis -an unusual or allergic reaction to influenza virus  vaccine, latex, other medicines, foods, dyes, or preservatives. Different brands of vaccines contain different allergens. Some may contain latex or eggs. Talk to your doctor about your allergies to make sure that you get the right vaccine. -pregnant or trying to get pregnant -breast-feeding How should I use this medicine? This vaccine is for injection into a muscle or under the skin. It is given by a health care professional. A copy of Vaccine Information Statements will be given before each vaccination. Read this sheet carefully each time. The sheet may change frequently. Talk to your healthcare provider to see which vaccines are right for you. Some vaccines should not be used in all age groups. Overdosage: If you think you have taken too much of this medicine contact a poison control center or emergency room at once. NOTE: This medicine is only for you. Do not share this medicine with others. What if I miss a dose? This does not apply. What may interact with this medicine? -chemotherapy or radiation therapy -medicines that lower your immune system like etanercept, anakinra, infliximab, and adalimumab -medicines that treat or prevent blood clots like warfarin -phenytoin -steroid medicines like prednisone or cortisone -theophylline -vaccines This list may not describe all possible interactions. Give your health care provider a list of all the medicines, herbs, non-prescription drugs, or dietary supplements you use. Also tell them if you smoke, drink alcohol, or use illegal drugs. Some items may interact with your medicine. What should I watch for while using this medicine? Report any side effects that do  not go away within 3 days to your doctor or health care professional. Call your health care provider if any unusual symptoms occur within 6 weeks of receiving this vaccine. You may still catch the flu, but the illness is not usually as bad. You cannot get the flu from the vaccine. The vaccine  will not protect against colds or other illnesses that may cause fever. The vaccine is needed every year. What side effects may I notice from receiving this medicine? Side effects that you should report to your doctor or health care professional as soon as possible: -allergic reactions like skin rash, itching or hives, swelling of the face, lips, or tongue Side effects that usually do not require medical attention (report to your doctor or health care professional if they continue or are bothersome): -fever -headache -muscle aches and pains -pain, tenderness, redness, or swelling at the injection site -tiredness This list may not describe all possible side effects. Call your doctor for medical advice about side effects. You may report side effects to FDA at 1-800-FDA-1088. Where should I keep my medicine? The vaccine will be given by a health care professional in a clinic, pharmacy, doctor's office, or other health care setting. You will not be given vaccine doses to store at home. NOTE: This sheet is a summary. It may not cover all possible information. If you have questions about this medicine, talk to your doctor, pharmacist, or health care provider.  2018 Elsevier/Gold Standard (2015-01-12 10:07:28)

## 2017-04-11 LAB — HEPATITIS C ANTIBODY
Hepatitis C Ab: NONREACTIVE
SIGNAL TO CUT-OFF: 0.01 (ref <1.00)

## 2017-05-08 ENCOUNTER — Ambulatory Visit: Payer: Medicare Other | Admitting: Internal Medicine

## 2017-05-12 ENCOUNTER — Other Ambulatory Visit: Payer: Self-pay | Admitting: Internal Medicine

## 2017-05-12 NOTE — Telephone Encounter (Signed)
This encounter was created in error - please disregard.

## 2017-05-14 DIAGNOSIS — M71572 Other bursitis, not elsewhere classified, left ankle and foot: Secondary | ICD-10-CM | POA: Diagnosis not present

## 2017-05-14 DIAGNOSIS — M7662 Achilles tendinitis, left leg: Secondary | ICD-10-CM | POA: Diagnosis not present

## 2017-05-18 ENCOUNTER — Other Ambulatory Visit: Payer: Self-pay

## 2017-05-19 ENCOUNTER — Other Ambulatory Visit: Payer: Self-pay | Admitting: *Deleted

## 2017-05-19 ENCOUNTER — Encounter: Payer: Self-pay | Admitting: *Deleted

## 2017-05-19 DIAGNOSIS — H40033 Anatomical narrow angle, bilateral: Secondary | ICD-10-CM | POA: Diagnosis not present

## 2017-05-19 DIAGNOSIS — H401221 Low-tension glaucoma, left eye, mild stage: Secondary | ICD-10-CM | POA: Diagnosis not present

## 2017-05-19 DIAGNOSIS — H401212 Low-tension glaucoma, right eye, moderate stage: Secondary | ICD-10-CM | POA: Diagnosis not present

## 2017-05-19 NOTE — Patient Outreach (Signed)
[facility]) Care Management  05/19/2017  Jason LUBECK Sr. [birth year] [id number]   RN Health Coach telephone call to patient.  [employer] compliance verified. Per patient he has transportation. He is able to afford his medications. Per patient most of them are zero co pay with a few having a $45.00 co pay. He has no questions about his medications at this time. Patient doesn't need a Education officer, museum at this time. Patient has declined services but would like a pamphlet sent. Plan:  Rn will send unsuccessful outreach and pamphlet at this time  [place] [facility] [phone]

## 2017-05-26 DIAGNOSIS — N189 Chronic kidney disease, unspecified: Secondary | ICD-10-CM | POA: Diagnosis not present

## 2017-05-26 DIAGNOSIS — N185 Chronic kidney disease, stage 5: Secondary | ICD-10-CM | POA: Diagnosis not present

## 2017-05-26 DIAGNOSIS — N2581 Secondary hyperparathyroidism of renal origin: Secondary | ICD-10-CM | POA: Diagnosis not present

## 2017-05-29 DIAGNOSIS — G4733 Obstructive sleep apnea (adult) (pediatric): Secondary | ICD-10-CM | POA: Diagnosis not present

## 2017-06-01 DIAGNOSIS — M65872 Other synovitis and tenosynovitis, left ankle and foot: Secondary | ICD-10-CM | POA: Diagnosis not present

## 2017-06-01 DIAGNOSIS — G5752 Tarsal tunnel syndrome, left lower limb: Secondary | ICD-10-CM | POA: Diagnosis not present

## 2017-06-03 ENCOUNTER — Ambulatory Visit (HOSPITAL_COMMUNITY): Payer: Medicare Other

## 2017-06-03 DIAGNOSIS — N185 Chronic kidney disease, stage 5: Secondary | ICD-10-CM | POA: Diagnosis not present

## 2017-06-03 DIAGNOSIS — N2581 Secondary hyperparathyroidism of renal origin: Secondary | ICD-10-CM | POA: Diagnosis not present

## 2017-06-03 DIAGNOSIS — I120 Hypertensive chronic kidney disease with stage 5 chronic kidney disease or end stage renal disease: Secondary | ICD-10-CM | POA: Diagnosis not present

## 2017-06-03 DIAGNOSIS — D631 Anemia in chronic kidney disease: Secondary | ICD-10-CM | POA: Diagnosis not present

## 2017-06-05 ENCOUNTER — Ambulatory Visit (HOSPITAL_COMMUNITY)
Admission: RE | Admit: 2017-06-05 | Discharge: 2017-06-05 | Disposition: A | Discharge status: Home / Self Care | Payer: Medicare Other | Source: Ambulatory Visit | Attending: Oncology | Admitting: Oncology

## 2017-06-05 DIAGNOSIS — C641 Malignant neoplasm of right kidney, except renal pelvis: Secondary | ICD-10-CM | POA: Diagnosis not present

## 2017-06-05 DIAGNOSIS — R911 Solitary pulmonary nodule: Secondary | ICD-10-CM | POA: Diagnosis not present

## 2017-06-05 DIAGNOSIS — C20 Malignant neoplasm of rectum: Secondary | ICD-10-CM | POA: Diagnosis not present

## 2017-06-05 DIAGNOSIS — R918 Other nonspecific abnormal finding of lung field: Secondary | ICD-10-CM | POA: Insufficient documentation

## 2017-06-08 ENCOUNTER — Encounter: Payer: Self-pay | Admitting: Pulmonary Disease

## 2017-06-09 ENCOUNTER — Encounter: Payer: Self-pay | Admitting: Pulmonary Disease

## 2017-06-09 ENCOUNTER — Telehealth: Payer: Self-pay | Admitting: *Deleted

## 2017-06-09 ENCOUNTER — Ambulatory Visit (INDEPENDENT_AMBULATORY_CARE_PROVIDER_SITE_OTHER): Payer: Medicare Other | Admitting: Pulmonary Disease

## 2017-06-09 DIAGNOSIS — G4733 Obstructive sleep apnea (adult) (pediatric): Secondary | ICD-10-CM

## 2017-06-09 DIAGNOSIS — C20 Malignant neoplasm of rectum: Secondary | ICD-10-CM

## 2017-06-09 NOTE — Assessment & Plan Note (Signed)
Unfortunately has a new nodule in the right upper lobe, likely metastatic. Also right kidney lesion seems to have grown in size. Will likely need follow-up PET scan-defer to oncology

## 2017-06-09 NOTE — Patient Instructions (Addendum)
CPAP is working well. Change settings to 10 cm

## 2017-06-09 NOTE — Addendum Note (Signed)
Addended by: Valerie Salts on: 06/09/2017 04:14 PM   Modules accepted: Orders

## 2017-06-09 NOTE — Telephone Encounter (Signed)
-----   Message from [name], MD sent at 06/08/2017  5:53 PM EST ----- Please call patient, CT shows enlargement of renal lesion, may represent a kidney cancer, possible new rt. Lung nodule, I will review in conference next 2 weeks with radiology  Schedule appt. With lisa 12/10 or 12/11

## 2017-06-09 NOTE — Assessment & Plan Note (Signed)
CPAP is working well. Change settings to 10 cm   Weight loss encouraged, compliance with goal of at least 4-6 hrs every night is the expectation. Advised against medications with sedative side effects Cautioned against driving when sleepy - understanding that sleepiness will vary on a day to day basis

## 2017-06-09 NOTE — Telephone Encounter (Signed)
Called pt with appt to review CT results, he needs late afternoon. Appt given for 06/16/17.

## 2017-06-09 NOTE — Progress Notes (Signed)
   Subjective:    Patient ID: Jason Cotta Sr., male    DOB: 1946/03/26, 71 y.o.   MRN: [id number]  HPI 71 year old for follow-up of obstructive sleep apnea.  Had rectal CA in 2014 s/p surgery, chemo, radiation recurrence with left upper lobe metastases in 2017 requiring partial lobectomy.  He has settled down with his CPAP machine and full facemask.  This is really helped improve his daytime somnolence and fatigue.  Denies any problems with mask or pressure, occasional dryness. Wife Jason [name] reports that he is sleeping well, no snoring.  Continues to have some daytime naps about 1 hour every day, unintentional.  No problems getting supplies. Bedtime is around 11 PM, wakes up by 7 AM feeling rested Download was reviewed which shows excellent control of events on 10 cm average on auto settings 5-15 cm with minimal leak and excellent compliance more than 8 hours every night  Reviewed his CT scan from 12/1 which shows unfortunately new nodule in the right upper lobe and enlarging right renal lesion.  He is under treatment for renal insufficiency and has bipedal edema in spite of being on Lasix   Significant tests/ events reviewed  HST 11/2015  AHI 54.  Autocpap 5-15    Past Medical History:  Diagnosis Date  . Anemia   . Arthritis   . Chronic renal insufficiency, stage II (mild)   . COPD (chronic obstructive pulmonary disease) ([place])    pt denies  . Diverticulosis   . ED (erectile dysfunction)   . Gout   . History of radiation therapy 02/21/13-03/31/13   rectum 50.4Gy total dose  . Hyperplastic colon polyp   . Hypertension   . rectal ca dx'd 02/01/13   rectal. Radiation and chemotherapy- remains on chemotherapy-next tx. 08-22-13.   . S/P thoracotomy   . Sleep apnea    uses cpap    Review of Systems neg for any significant sore throat, dysphagia, itching, sneezing, nasal congestion or excess/ purulent secretions, fever, chills, sweats, unintended wt loss, pleuritic or exertional  cp, hempoptysis, orthopnea pnd or change in chronic leg swelling. Also denies presyncope, palpitations, heartburn, abdominal pain, nausea, vomiting, diarrhea or change in bowel or urinary habits, dysuria,hematuria, rash, arthralgias, visual complaints, headache, numbness weakness or ataxia.     Objective:   Physical Exam  Gen. Pleasant, obese, in no distress ENT - class 2 airway, no post nasal drip Neck: No JVD, no thyromegaly, no carotid bruits Lungs: no use of accessory muscles, no dullness to percussion, decreased without rales or rhonchi  Cardiovascular: Rhythm regular, heart sounds  normal, no murmurs or gallops, no peripheral edema Musculoskeletal: No deformities, no cyanosis or clubbing , no tremors        Assessment & Plan:

## 2017-06-16 ENCOUNTER — Encounter: Payer: Self-pay | Admitting: Nurse Practitioner

## 2017-06-16 ENCOUNTER — Ambulatory Visit: Payer: Medicare Other | Admitting: Nurse Practitioner

## 2017-06-16 VITALS — BP 140/71 | HR 89 | Temp 98.7°F | Resp 18 | Ht 68.0 in | Wt 214.8 lb

## 2017-06-16 DIAGNOSIS — R911 Solitary pulmonary nodule: Secondary | ICD-10-CM

## 2017-06-16 DIAGNOSIS — Z85048 Personal history of other malignant neoplasm of rectum, rectosigmoid junction, and anus: Secondary | ICD-10-CM

## 2017-06-16 DIAGNOSIS — N289 Disorder of kidney and ureter, unspecified: Secondary | ICD-10-CM

## 2017-06-16 DIAGNOSIS — C20 Malignant neoplasm of rectum: Secondary | ICD-10-CM

## 2017-06-16 NOTE — Progress Notes (Addendum)
[facility] OFFICE PROGRESS NOTE   Diagnosis: Rectal cancer  INTERVAL HISTORY:   Jason [name] returns as scheduled.  Feels well.  No change in bowel habits.  No bloody or black stools.  He denies shortness of breath.  Objective:  Vital signs in last 24 hours:  Blood pressure 140/71, pulse 89, temperature 98.7 F (37.1 C), temperature source Oral, resp. rate 18, height 5\' 8"  (1.727 m), weight 214 lb 12.8 oz (97.4 kg), SpO2 100 %.    HEENT: Neck without mass. Lymphatics: No palpable cervical, supraclavicular, axillary or inguinal lymph nodes. Resp: Lungs clear bilaterally. Cardio: Regular rate and rhythm. GI: Abdomen soft and nontender.  No hepatomegaly.  No mass.  No apparent ascites. Vascular: Trace edema at the lower legs bilaterally.   Lab Results:  Lab Results  Component Value Date   WBC 5.5 02/24/2017   HGB 10.9 (A) 02/24/2017   HCT 36 (A) 02/24/2017   MCV 85.3 10/17/2016   PLT 255 02/24/2017   NEUTROABS 4.6 10/14/2016    Imaging:  No results found.  Medications: I have reviewed the patient's current medications.  Assessment/Plan: 1. Rectal cancer, clinical stage III (uT3, uN2).  Initiation of concurrent Xeloda and radiation on 02/21/2013, completed 03/31/2013.   CEA normal 04/25/2013   Status post laparoscopic low anterior resection with diverting loop ileostomy 05/23/2013. Final pathology showed a 3.2 cm invasive adenocarcinoma with neoadjuvant related change. Tumor invaded into perirectal soft tissue. There was no lymphovascular or perineural invasion. 14 lymph nodes were negative for tumor. Surgical margins were negative. There was one hyperplastic polyp (ypT3, pN0).   Initiation of weekly 5-FU/leucovorin 08/01/2013   Ileostomy takedown 08/24/2013.   Weekly 5-FU/leucovorin resumed 09/22/2013.  Restaging CTs 02/20/2014 negative for evidence of recurrent disease  restaging CT scans 04/05/2015 negative for evidence of recurrent  disease  Surveillance colonoscopy 03/31/2014, status post removal of of a tubular adenoma from the ascending colon  Enlarging left posterior medial upper lobe nodule on chest CT 04/05/2015 and 07/12/2015  PET scan 10/22/2015 with a hypermetabolic nodule in the medial laxative upper lobe, other lung nodules stable  Left lung wedge resection 11/20/2015 confirmed a 1 cm focus of metastatic adenocarcinoma of colorectal origin, resection margins and multiple lymph nodes negative  CT scans 03/17/2016-bandlike scarring along the left upper lobe nodule resection with a soft tissue density component; calcified bilateral pleural plaques; no findings of recurrence of the abdomen or pelvis.  CTs 09/30/2016-negative for progressive disease  CTs 06/05/2017-new pulmonary nodule right upper lobe.  Stable pleural nodularity within the left and right lung.  Stable nodular thickening along the left upper lobe resection margin.  No evidence of metastasis in the abdomen/pelvis.  Interval enlargement of a solid lesion in the medial aspect of the right kidney most consistent with renal cell carcinoma.  2. Markedly elevated pretreatment CEA 3. Indeterminate pulmonary/subpleural nodules and right hepatic dome lesion. Unchanged on the CT 04/25/2013, unchanged nodular lung lesions on the CT 02/20/2014, see follow-up CTs/PET above 4. Renal insufficiency. Acute on chronic renal failure during hospitalization November/December 2014. Improved following the ileostomy takedown. 5. History of Anemia secondary chemotherapy, chronic disease, and renal insufficiency 6. Hypertension. 7. Gout. 8. Prolonged postoperative ileus following laparoscopic low anterior resection with diverting loop ileostomy 05/23/2013. 9. C. difficile-positive 09/08/2013-status post Flagyl with improvement. Recurrent diarrhea following completion of Flagyl. Stool positive for C. difficile on 09/24/2013. He completed treatment with vancomycin. The  diarrhea resolved. 10. Left greater than right low leg edema. Venous Doppler  09/26/2013 negative for left leg DVT. 11. Rectal urgency/incontinence-Improved with participation in the pelvic physical therapy clinic.    Disposition: Jason [name] appears stable.  The recent restaging CT scans show a right upper lobe lung nodule as well as enlargement of a right kidney lesion.  Dr. Benay Spice reviewed the CT report/images with Jason [name] and his wife at today's visit.  The lung nodule may have been present on the previous CT scan.  His case will be presented at the upcoming GI conference.  We will make a referral to urology to evaluate the right kidney lesion.  He will follow-up as scheduled 09/02/2017.  We will adjust accordingly pending outcome of the above.  Patient seen with Dr. Benay Spice.  25 minutes were spent face-to-face at today's visit with the majority of that time involved in counseling/coordination of care.  Ned Card ANP/GNP-BC   06/16/2017  3:27 PM  This was a shared visit with Ned Card.  Jason [name] remains in clinical remission from rectal cancer.  We reviewed the CT images with Jason [name] and his wife.  It appears the right lung nodule was present on a previous CT.  I will reviewed the images and radiology and contact Jason [name].  We referred him to urology to evaluate the right renal lesion.   Julieanne Manson, MD

## 2017-06-22 ENCOUNTER — Telehealth: Payer: Self-pay | Admitting: *Deleted

## 2017-06-22 ENCOUNTER — Telehealth: Payer: Self-pay

## 2017-06-22 DIAGNOSIS — M722 Plantar fascial fibromatosis: Secondary | ICD-10-CM | POA: Diagnosis not present

## 2017-06-22 DIAGNOSIS — G5751 Tarsal tunnel syndrome, right lower limb: Secondary | ICD-10-CM | POA: Diagnosis not present

## 2017-06-22 NOTE — Telephone Encounter (Signed)
-----   Message from [name], MD sent at 06/19/2017  6:56 PM EST ----- Please call patient, rt. Lung nodule has been present on previous Cts and is likely benign F/u as scheduled, see urologist to evaluate renal lesion

## 2017-06-22 NOTE — Telephone Encounter (Signed)
Diane at [facility] radiology called that radiologise addended CT CAP on 12/14 and wanted Dr Benay Spice to be aware.

## 2017-06-24 ENCOUNTER — Telehealth: Payer: Self-pay | Admitting: *Deleted

## 2017-06-24 DIAGNOSIS — I10 Essential (primary) hypertension: Secondary | ICD-10-CM | POA: Diagnosis not present

## 2017-06-24 DIAGNOSIS — R0989 Other specified symptoms and signs involving the circulatory and respiratory systems: Secondary | ICD-10-CM | POA: Diagnosis not present

## 2017-06-24 DIAGNOSIS — N185 Chronic kidney disease, stage 5: Secondary | ICD-10-CM | POA: Diagnosis not present

## 2017-06-24 DIAGNOSIS — R600 Localized edema: Secondary | ICD-10-CM | POA: Diagnosis not present

## 2017-06-24 NOTE — Telephone Encounter (Signed)
Called with Urology Appt. For 06/26/2017 at 11 AM with [facility]. Left [facility] number with patient.

## 2017-06-25 NOTE — Telephone Encounter (Signed)
Called pt to confirm he received the message re: urology appt. He reports he rescheduled to 3PM.

## 2017-06-26 ENCOUNTER — Telehealth: Payer: Self-pay

## 2017-06-26 NOTE — Telephone Encounter (Signed)
-----   Message from [name], MD sent at 06/25/2017  6:06 PM EST ----- Please call patient, lung nodule and right renal mass have been present in the past and are likely benign.  [facility] referral

## 2017-06-26 NOTE — Telephone Encounter (Signed)
Patient returned phone call. Alerted patient of information in MD note below. Also told him that we canceled urology appointment. Patient voiced understanding.

## 2017-06-26 NOTE — Telephone Encounter (Signed)
Left message for patient to call office. [facility] and canceled appointment based on MD note below.

## 2017-07-02 DIAGNOSIS — G4733 Obstructive sleep apnea (adult) (pediatric): Secondary | ICD-10-CM | POA: Diagnosis not present

## 2017-07-08 DIAGNOSIS — R600 Localized edema: Secondary | ICD-10-CM | POA: Diagnosis not present

## 2017-07-08 DIAGNOSIS — R0989 Other specified symptoms and signs involving the circulatory and respiratory systems: Secondary | ICD-10-CM | POA: Diagnosis not present

## 2017-07-09 DIAGNOSIS — M12271 Villonodular synovitis (pigmented), right ankle and foot: Secondary | ICD-10-CM | POA: Diagnosis not present

## 2017-07-09 DIAGNOSIS — G5751 Tarsal tunnel syndrome, right lower limb: Secondary | ICD-10-CM | POA: Diagnosis not present

## 2017-07-16 DIAGNOSIS — I10 Essential (primary) hypertension: Secondary | ICD-10-CM | POA: Diagnosis not present

## 2017-07-16 DIAGNOSIS — R0989 Other specified symptoms and signs involving the circulatory and respiratory systems: Secondary | ICD-10-CM | POA: Diagnosis not present

## 2017-07-16 DIAGNOSIS — R600 Localized edema: Secondary | ICD-10-CM | POA: Diagnosis not present

## 2017-07-16 DIAGNOSIS — I5020 Unspecified systolic (congestive) heart failure: Secondary | ICD-10-CM | POA: Diagnosis not present

## 2017-08-07 DIAGNOSIS — M792 Neuralgia and neuritis, unspecified: Secondary | ICD-10-CM | POA: Diagnosis not present

## 2017-08-10 DIAGNOSIS — D631 Anemia in chronic kidney disease: Secondary | ICD-10-CM | POA: Diagnosis not present

## 2017-08-10 DIAGNOSIS — N185 Chronic kidney disease, stage 5: Secondary | ICD-10-CM | POA: Diagnosis not present

## 2017-08-10 DIAGNOSIS — N2581 Secondary hyperparathyroidism of renal origin: Secondary | ICD-10-CM | POA: Diagnosis not present

## 2017-08-10 DIAGNOSIS — N189 Chronic kidney disease, unspecified: Secondary | ICD-10-CM | POA: Diagnosis not present

## 2017-08-10 LAB — BASIC METABOLIC PANEL
BUN: 50 — AB (ref 4–21)
Creatinine: 3.7 — AB (ref 0.6–1.3)
GLUCOSE: 98
Potassium: 2.6 — AB (ref 3.4–5.3)

## 2017-08-10 LAB — VITAMIN D 25 HYDROXY (VIT D DEFICIENCY, FRACTURES): Vit D, 25-Hydroxy: 28.6

## 2017-08-10 LAB — CBC AND DIFFERENTIAL
HCT: 31 — AB (ref 41–53)
Hemoglobin: 10.5 — AB (ref 13.5–17.5)
PLATELETS: 258 (ref 150–399)
WBC: 6.1

## 2017-08-10 LAB — IRON,TIBC AND FERRITIN PANEL
%SAT: 15
FERRITIN: 264

## 2017-08-12 DIAGNOSIS — I10 Essential (primary) hypertension: Secondary | ICD-10-CM | POA: Diagnosis not present

## 2017-08-12 DIAGNOSIS — I5020 Unspecified systolic (congestive) heart failure: Secondary | ICD-10-CM | POA: Diagnosis not present

## 2017-08-12 DIAGNOSIS — R600 Localized edema: Secondary | ICD-10-CM | POA: Diagnosis not present

## 2017-08-12 DIAGNOSIS — N185 Chronic kidney disease, stage 5: Secondary | ICD-10-CM | POA: Diagnosis not present

## 2017-08-19 DIAGNOSIS — D631 Anemia in chronic kidney disease: Secondary | ICD-10-CM | POA: Diagnosis not present

## 2017-08-19 DIAGNOSIS — I120 Hypertensive chronic kidney disease with stage 5 chronic kidney disease or end stage renal disease: Secondary | ICD-10-CM | POA: Diagnosis not present

## 2017-08-19 DIAGNOSIS — N185 Chronic kidney disease, stage 5: Secondary | ICD-10-CM | POA: Diagnosis not present

## 2017-08-19 DIAGNOSIS — N2581 Secondary hyperparathyroidism of renal origin: Secondary | ICD-10-CM | POA: Diagnosis not present

## 2017-08-31 ENCOUNTER — Other Ambulatory Visit (HOSPITAL_COMMUNITY): Payer: Self-pay | Admitting: *Deleted

## 2017-09-01 ENCOUNTER — Other Ambulatory Visit: Payer: Self-pay | Admitting: Nurse Practitioner

## 2017-09-01 ENCOUNTER — Encounter (HOSPITAL_COMMUNITY)
Admission: RE | Admit: 2017-09-01 | Discharge: 2017-09-01 | Disposition: A | Discharge status: Home / Self Care | Payer: Medicare Other | Source: Ambulatory Visit | Attending: Nephrology | Admitting: Nephrology

## 2017-09-01 DIAGNOSIS — D509 Iron deficiency anemia, unspecified: Secondary | ICD-10-CM | POA: Diagnosis not present

## 2017-09-01 DIAGNOSIS — C20 Malignant neoplasm of rectum: Secondary | ICD-10-CM

## 2017-09-01 MED ORDER — SODIUM CHLORIDE 0.9 % IV SOLN
510.0000 mg | INTRAVENOUS | Status: DC
  Administered 2017-09-01: 510 mg via INTRAVENOUS
  Filled 2017-09-01: qty 17

## 2017-09-02 ENCOUNTER — Inpatient Hospital Stay: Payer: Medicare Other

## 2017-09-02 ENCOUNTER — Inpatient Hospital Stay: Payer: Medicare Other | Attending: Nurse Practitioner | Admitting: Nurse Practitioner

## 2017-09-02 ENCOUNTER — Telehealth: Payer: Self-pay

## 2017-09-02 ENCOUNTER — Encounter: Payer: Self-pay | Admitting: Nurse Practitioner

## 2017-09-02 VITALS — BP 154/64 | HR 99 | Temp 99.0°F | Resp 19 | Ht 68.0 in | Wt 210.0 lb

## 2017-09-02 DIAGNOSIS — Z85048 Personal history of other malignant neoplasm of rectum, rectosigmoid junction, and anus: Secondary | ICD-10-CM | POA: Insufficient documentation

## 2017-09-02 DIAGNOSIS — C20 Malignant neoplasm of rectum: Secondary | ICD-10-CM

## 2017-09-02 NOTE — Progress Notes (Signed)
[facility] OFFICE PROGRESS NOTE   Diagnosis: Rectal cancer  INTERVAL HISTORY:   Jason [name] returns as scheduled.  He overall feels well.  He had some loose stools last week.  Bowel habits this week back to baseline.  No nausea or vomiting.  No bleeding with bowel movements.  He reports a good appetite.  He had left hip pain yesterday.  The pain is better today.  He denies shortness of breath.  Objective:  Vital signs in last 24 hours:  Blood pressure (!) 154/64, pulse 99, temperature 99 F (37.2 C), temperature source Oral, resp. rate 19, height 5\' 8"  (1.727 m), weight 210 lb (95.3 kg), SpO2 100 %.    HEENT: Neck without mass. Lymphatics: No palpable cervical, supraclavicular, axillary or inguinal lymph nodes. Resp: Lungs clear bilaterally. Cardio: Regular rate and rhythm. GI: Abdomen soft and nontender.  No hepatomegaly.  No mass. Vascular: 1+ pitting edema at the lower legs bilaterally.    Lab Results:  Lab Results  Component Value Date   WBC 5.5 02/24/2017   HGB 10.9 (A) 02/24/2017   HCT 36 (A) 02/24/2017   MCV 85.3 10/17/2016   PLT 255 02/24/2017   NEUTROABS 4.6 10/14/2016    Imaging:  No results found.  Medications: I have reviewed the patient's current medications.  Assessment/Plan: 1. Rectal cancer, clinical stage III (uT3, uN2).  Initiation of concurrent Xeloda and radiation on 02/21/2013, completed 03/31/2013.   CEA normal 04/25/2013   Status post laparoscopic low anterior resection with diverting loop ileostomy 05/23/2013. Final pathology showed a 3.2 cm invasive adenocarcinoma with neoadjuvant related change. Tumor invaded into perirectal soft tissue. There was no lymphovascular or perineural invasion. 14 lymph nodes were negative for tumor. Surgical margins were negative. There was one hyperplastic polyp (ypT3, pN0).   Initiation of weekly 5-FU/leucovorin 08/01/2013   Ileostomy takedown 08/24/2013.   Weekly 5-FU/leucovorin  resumed 09/22/2013.  Restaging CTs 02/20/2014 negative for evidence of recurrent disease  restaging CT scans 04/05/2015 negative for evidence of recurrent disease  Surveillance colonoscopy 03/31/2014, status post removal of of a tubular adenoma from the ascending colon  Enlarging left posterior medial upper lobe nodule on chest CT 04/05/2015 and 07/12/2015  PET scan 10/22/2015 with a hypermetabolic nodule in the medial aspect of the left upper lobe, other lung nodules stable  Left lung wedge resection 11/20/2015 confirmed a 1 cm focus of metastatic adenocarcinoma of colorectal origin, resection margins and multiple lymph nodes negative  CT scans 03/17/2016-bandlike scarring along the left upper lobe nodule resection with a soft tissue density component; calcified bilateral pleural plaques; no findings of recurrence of the abdomen or pelvis.  CTs 09/30/2016-negative for progressive disease  CTs 06/05/2017-new pulmonary nodule right upper lobe.  Stable pleural nodularity within the left and right lung.  Stable nodular thickening along the left upper lobe resection margin.  No evidence of metastasis in the abdomen/pelvis.  Interval enlargement of a solid lesion in the medial aspect of the right kidney most consistent with renal cell carcinoma.--Right lung nodule and right renal mass present on previous CTs and likely benign  2. Markedly elevated pretreatment CEA 3. Indeterminate pulmonary/subpleural nodules and right hepatic dome lesion. Unchanged on the CT 04/25/2013, unchanged nodular lung lesions on the CT 02/20/2014, see follow-up CTs/PET above 4. Renal insufficiency. Acute on chronic renal failure during hospitalization November/December 2014. Improved following the ileostomy takedown.  He is followed by nephrology. 5. History of Anemia secondary chemotherapy, chronic disease, and renal insufficiency 6. Hypertension. 7.  Gout. 8. Prolonged postoperative ileus following laparoscopic low  anterior resection with diverting loop ileostomy 05/23/2013. 9. C. difficile-positive 09/08/2013-status post Flagyl with improvement. Recurrent diarrhea following completion of Flagyl. Stool positive for C. difficile on 09/24/2013. He completed treatment with vancomycin. The diarrhea resolved. 10. Left greater than right low leg edema. Venous Doppler 09/26/2013 negative for left leg DVT. 11. Rectal urgency/incontinence-Improved with participation in the pelvic physical therapy clinic.    Disposition: Jason [name] remains in clinical remission from rectal cancer.  We will follow-up on the CEA from today.  He will undergo restaging CTs in 3 months.  We will see him in follow-up a few days after the CT scans to review the results.  He will contact the office in the interim with any problems.    Ned Card ANP/GNP-BC   09/02/2017  3:43 PM

## 2017-09-02 NOTE — Telephone Encounter (Signed)
Printed avs and calender of upcoming appointment. Per 2/27 los

## 2017-09-03 ENCOUNTER — Telehealth: Payer: Self-pay | Admitting: Emergency Medicine

## 2017-09-03 LAB — CEA (IN HOUSE-CHCC): CEA (CHCC-In House): <1 ng/mL (ref 0.00–5.00)

## 2017-09-03 NOTE — Telephone Encounter (Addendum)
Pt verbalized understanding of this note.   ----- Message from Owens Shark, NP sent at 09/03/2017 12:05 PM EST ----- Please let him know the CEA is normal.  Follow-up as scheduled.

## 2017-09-07 ENCOUNTER — Encounter (HOSPITAL_COMMUNITY): Payer: Medicare Other

## 2017-09-08 ENCOUNTER — Ambulatory Visit (HOSPITAL_COMMUNITY)
Admission: RE | Admit: 2017-09-08 | Discharge: 2017-09-08 | Disposition: A | Discharge status: Home / Self Care | Payer: Medicare Other | Source: Ambulatory Visit | Attending: Nephrology | Admitting: Nephrology

## 2017-09-08 DIAGNOSIS — D509 Iron deficiency anemia, unspecified: Secondary | ICD-10-CM | POA: Insufficient documentation

## 2017-09-08 MED ORDER — SODIUM CHLORIDE 0.9 % IV SOLN
510.0000 mg | INTRAVENOUS | Status: AC
  Administered 2017-09-08: 13:00:00 510 mg via INTRAVENOUS
  Filled 2017-09-08: qty 17

## 2017-09-09 ENCOUNTER — Other Ambulatory Visit: Payer: Self-pay | Admitting: Internal Medicine

## 2017-09-09 DIAGNOSIS — N185 Chronic kidney disease, stage 5: Secondary | ICD-10-CM | POA: Diagnosis not present

## 2017-09-09 DIAGNOSIS — I10 Essential (primary) hypertension: Secondary | ICD-10-CM | POA: Diagnosis not present

## 2017-09-09 DIAGNOSIS — R600 Localized edema: Secondary | ICD-10-CM | POA: Diagnosis not present

## 2017-09-09 DIAGNOSIS — I5020 Unspecified systolic (congestive) heart failure: Secondary | ICD-10-CM | POA: Diagnosis not present

## 2017-09-09 NOTE — Telephone Encounter (Signed)
Pt is due for an appt. Will send in refills once an appt is made.

## 2017-09-11 NOTE — Telephone Encounter (Signed)
Spoke to patient and let him know. He is going to check his other appointments and give [place] a call back to schedule.

## 2017-09-11 NOTE — Telephone Encounter (Signed)
Will send in once an appt is made.

## 2017-09-15 DIAGNOSIS — G4733 Obstructive sleep apnea (adult) (pediatric): Secondary | ICD-10-CM | POA: Diagnosis not present

## 2017-09-15 NOTE — Telephone Encounter (Signed)
erx for 90 day sent to mail order.

## 2017-09-23 ENCOUNTER — Ambulatory Visit: Payer: Medicare Other | Admitting: Internal Medicine

## 2017-09-23 ENCOUNTER — Encounter: Payer: Self-pay | Admitting: Internal Medicine

## 2017-09-23 ENCOUNTER — Other Ambulatory Visit (INDEPENDENT_AMBULATORY_CARE_PROVIDER_SITE_OTHER): Payer: Medicare Other

## 2017-09-23 VITALS — BP 148/80 | HR 99 | Temp 98.4°F | Resp 16 | Ht 68.0 in | Wt 207.0 lb

## 2017-09-23 DIAGNOSIS — E785 Hyperlipidemia, unspecified: Secondary | ICD-10-CM | POA: Diagnosis not present

## 2017-09-23 DIAGNOSIS — D539 Nutritional anemia, unspecified: Secondary | ICD-10-CM | POA: Diagnosis not present

## 2017-09-23 DIAGNOSIS — N183 Chronic kidney disease, stage 3 unspecified: Secondary | ICD-10-CM

## 2017-09-23 DIAGNOSIS — I10 Essential (primary) hypertension: Secondary | ICD-10-CM | POA: Diagnosis not present

## 2017-09-23 DIAGNOSIS — M1000 Idiopathic gout, unspecified site: Secondary | ICD-10-CM | POA: Diagnosis not present

## 2017-09-23 LAB — FOLATE: Folate: 18 ng/mL (ref >5.9)

## 2017-09-23 LAB — CBC WITH DIFFERENTIAL/PLATELET
BASOS PCT: 1 % (ref 0.0–3.0)
Basophils Absolute: 0 10*3/uL (ref 0.0–0.1)
EOS PCT: 1.1 % (ref 0.0–5.0)
Eosinophils Absolute: 0.1 10*3/uL (ref 0.0–0.7)
HEMATOCRIT: 34.5 % — AB (ref 39.0–52.0)
Hemoglobin: 11 g/dL — ABNORMAL LOW (ref 13.0–17.0)
LYMPHS PCT: 12.8 % (ref 12.0–46.0)
Lymphs Abs: 0.6 10*3/uL — ABNORMAL LOW (ref 0.7–4.0)
MCHC: 32 g/dL (ref 30.0–36.0)
MCV: 86.6 fl (ref 78.0–100.0)
MONOS PCT: 14.7 % — AB (ref 3.0–12.0)
Monocytes Absolute: 0.7 10*3/uL (ref 0.1–1.0)
Neutro Abs: 3.5 10*3/uL (ref 1.4–7.7)
Neutrophils Relative %: 70.4 % (ref 43.0–77.0)
Platelets: 193 10*3/uL (ref 150.0–400.0)
RBC: 3.98 Mil/uL — ABNORMAL LOW (ref 4.22–5.81)
RDW: 21.7 % — AB (ref 11.5–15.5)
WBC: 5 10*3/uL (ref 4.0–10.5)

## 2017-09-23 LAB — VITAMIN B12: Vitamin B-12: 719 pg/mL (ref 211–911)

## 2017-09-23 MED ORDER — CYANOCOBALAMIN 1000 MCG/ML IJ SOLN
1000.0000 ug | Freq: Once | INTRAMUSCULAR | Status: AC
  Administered 2017-09-23: 1000 ug via INTRAMUSCULAR

## 2017-09-23 NOTE — Progress Notes (Signed)
Subjective:  Patient ID: Jason Cotta Sr., male    DOB: [birth year]  Age: 72 y.o. MRN: [id number]  CC: Hyperlipidemia; Hypertension; and Anemia   HPI Jason Cotta Sr. presents for f/up -he tells me his blood pressure has been well controlled.  He has had mild edema around his ankles but he denies any recent episodes of chest pain, shortness of breath, or palpitations.  He saw his nephrologist about a month ago and was found to be anemic.  He has a history of B12 deficiency but is not receiving any form of B12 supplementation.  He wants a refill on allopurinol.  He has had no gout attacks over the last year.  Outpatient Medications Prior to Visit  Medication Sig Dispense Refill  . Acetaminophen (TYLENOL 8 HOUR PO) Take by mouth.    Marland Kitchen allopurinol (ZYLOPRIM) 300 MG tablet Take 1 tablet (300 mg total) by mouth daily. 90 tablet 0  . BIDIL 20-37.5 MG tablet Take 0.05 tablets by mouth 3 (three) times daily.    . cholecalciferol (VITAMIN D) 1000 units tablet Take 5,000 Units by mouth daily.    Marland Kitchen doxazosin (CARDURA) 8 MG tablet Take 1 tablet (8 mg total) by mouth daily. 90 tablet 0  . furosemide (LASIX) 80 MG tablet TAKE 1 TABLET BY MOUTH  DAILY 90 tablet 1  . gabapentin (NEURONTIN) 300 MG capsule Take 300 mg by mouth 3 (three) times daily. Take 300mg s morning and 600mg s at bedtime    . KLOR-CON M20 20 MEQ tablet Take 1 tablet (20 mEq total) by mouth daily. 90 tablet 0  . loperamide (IMODIUM) 2 MG capsule Take 2 mg by mouth as needed for diarrhea or loose stools.    . metoprolol tartrate (LOPRESSOR) 50 MG tablet Take 50 mg by mouth 2 (two) times daily.    . polycarbophil (FIBERCON) 625 MG tablet Take 1,250 mg by mouth daily.    . metoprolol succinate (TOPROL-XL) 25 MG 24 hr tablet     . nebivolol (BYSTOLIC) 10 MG tablet Take 10 mg by mouth daily.     No facility-administered medications prior to visit.     ROS Review of Systems  Constitutional: Negative for chills, diaphoresis, fatigue and  fever.  HENT: Negative.   Eyes: Negative for visual disturbance.  Respiratory: Negative for cough, chest tightness, shortness of breath and wheezing.   Cardiovascular: Negative for chest pain, palpitations and leg swelling.  Gastrointestinal: Negative for abdominal pain, constipation, diarrhea, nausea and vomiting.  Genitourinary: Negative.  Negative for difficulty urinating and urgency.  Musculoskeletal: Negative.  Negative for myalgias.  Skin: Negative.  Negative for color change and rash.  Allergic/Immunologic: Negative.   Neurological: Negative.  Negative for dizziness and weakness.  Hematological: Negative for adenopathy. Does not bruise/bleed easily.  Psychiatric/Behavioral: Negative.     Objective:  BP (!) 148/80 (BP Location: Left Arm, Patient Position: Sitting, Cuff Size: Large)   Pulse 99   Temp 98.4 F (36.9 C) (Oral)   Resp 16   Ht 5\' 8"  (1.727 m)   Wt 207 lb (93.9 kg)   SpO2 99%   BMI 31.47 kg/m   BP Readings from Last 3 Encounters:  09/23/17 (!) 148/80  09/08/17 133/70  09/02/17 (!) 154/64    Wt Readings from Last 3 Encounters:  09/23/17 207 lb (93.9 kg)  09/02/17 210 lb (95.3 kg)  09/01/17 205 lb (93 kg)    Physical Exam  Constitutional: He is oriented to person, place, and time. No  distress.  HENT:  Mouth/Throat: Oropharynx is clear and moist. No oropharyngeal exudate.  Eyes: Conjunctivae are normal. Left eye exhibits no discharge. No scleral icterus.  Neck: Neck supple. No JVD present. No thyromegaly present.  Cardiovascular: Normal rate, regular rhythm and normal heart sounds. Exam reveals no gallop.  No murmur heard. Pulmonary/Chest: Effort normal and breath sounds normal. No respiratory distress. He has no wheezes. He has no rales.  Abdominal: Soft. Bowel sounds are normal. He exhibits no distension and no mass. There is no tenderness. There is no guarding.  Musculoskeletal: Normal range of motion. He exhibits edema (trace edema BLE). He exhibits  no tenderness or deformity.  Lymphadenopathy:    He has no cervical adenopathy.  Neurological: He is alert and oriented to person, place, and time.  Skin: Skin is warm and dry. No rash noted. He is not diaphoretic. No erythema. No pallor.  Vitals reviewed.   Lab Results  Component Value Date   WBC 5.0 09/23/2017   HGB 11.0 (L) 09/23/2017   HCT 34.5 (L) 09/23/2017   PLT 193.0 09/23/2017   GLUCOSE 105 (H) 09/23/2017   CHOL 156 09/23/2017   TRIG 98.0 09/23/2017   HDL 50.90 09/23/2017   LDLDIRECT 120.0 06/04/2015   LDLCALC 85 09/23/2017   ALT 14 (L) 10/17/2016   AST 18 10/17/2016   NA 139 09/23/2017   K 4.0 09/23/2017   CL 101 09/23/2017   CREATININE 4.12 (H) 09/23/2017   BUN 58 (H) 09/23/2017   CO2 25 09/23/2017   TSH 5.41 (H) 09/23/2017   PSA 1.86 08/10/2013   INR 1.11 11/16/2015   HGBA1C 6.0 12/20/2009    No results found.  Assessment & Plan:   Tate was seen today for hyperlipidemia, hypertension and anemia.  Diagnoses and all orders for this visit:  Essential hypertension- His blood pressure is adequately well controlled.  His potassium level is normal now and his renal function is stable.  Will continue the potassium supplement in addition to the beta-blocker and loop diuretic. -     Basic metabolic panel; Future  Idiopathic gout, unspecified chronicity, unspecified site- Will continue allopurinol at the current dose.  Chronic renal impairment, stage 3 (moderate) (HCC)- His renal function stable.  He agrees to avoid nephrotoxic agents. -     Basic metabolic panel; Future  Deficiency anemia-his H&H has improved some.  I will screen for vitamin deficiencies.  I also think there is a component of renal insufficiency contributing to this. -     CBC with Differential/Platelet; Future -     Folate; Future -     Vitamin B12; Future -     Vitamin B1; Future -     cyanocobalamin ((VITAMIN B-12)) injection 1,000 mcg  Hyperlipidemia LDL goal <130- He has an elevated  ASCVD risk score.  I have asked him to start taking a statin for CV risk reduction. -     Thyroid Panel With TSH; Future -     Lipid panel; Future   I have discontinued Jason Cotta Sr. "Wolf Boulay Sr"'s nebivolol and metoprolol succinate. I am also having him maintain his gabapentin, loperamide, polycarbophil, Acetaminophen (TYLENOL 8 HOUR PO), furosemide, allopurinol, doxazosin, KLOR-CON M20, BIDIL, cholecalciferol, and metoprolol tartrate. We administered cyanocobalamin.  Meds ordered this encounter  Medications  . cyanocobalamin ((VITAMIN B-12)) injection 1,000 mcg     Follow-up: Return in about 3 months (around 12/24/2017).  [name], MD

## 2017-09-23 NOTE — Patient Instructions (Signed)
Anemia Anemia is a condition in which you do not have enough red blood cells or hemoglobin. Hemoglobin is a substance in red blood cells that carries oxygen. When you do not have enough red blood cells or hemoglobin (are anemic), your body cannot get enough oxygen and your organs may not work properly. As a result, you may feel very tired or have other problems. What are the causes? Common causes of anemia include:  Excessive bleeding. Anemia can be caused by excessive bleeding inside or outside the body, including bleeding from the intestine or from periods in women.  Poor nutrition.  Long-lasting (chronic) kidney, thyroid, and liver disease.  Bone marrow disorders.  Cancer and treatments for cancer.  HIV (human immunodeficiency virus) and AIDS (acquired immunodeficiency syndrome).  Treatments for HIV and AIDS.  Spleen problems.  Blood disorders.  Infections, medicines, and autoimmune disorders that destroy red blood cells.  What are the signs or symptoms? Symptoms of this condition include:  Minor weakness.  Dizziness.  Headache.  Feeling heartbeats that are irregular or faster than normal (palpitations).  Shortness of breath, especially with exercise.  Paleness.  Cold sensitivity.  Indigestion.  Nausea.  Difficulty sleeping.  Difficulty concentrating.  Symptoms may occur suddenly or develop slowly. If your anemia is mild, you may not have symptoms. How is this diagnosed? This condition is diagnosed based on:  Blood tests.  Your medical history.  A physical exam.  Bone marrow biopsy.  Your health care provider may also check your stool (feces) for blood and may do additional testing to look for the cause of your bleeding. You may also have other tests, including:  Imaging tests, such as a CT scan or MRI.  Endoscopy.  Colonoscopy.  How is this treated? Treatment for this condition depends on the cause. If you continue to lose a lot of blood,  you may need to be treated at a hospital. Treatment may include:  Taking supplements of iron, vitamin B12, or folic acid.  Taking a hormone medicine (erythropoietin) that can help to stimulate red blood cell growth.  Having a blood transfusion. This may be needed if you lose a lot of blood.  Making changes to your diet.  Having surgery to remove your spleen.  Follow these instructions at home:  Take over-the-counter and prescription medicines only as told by your health care provider.  Take supplements only as told by your health care provider.  Follow any diet instructions that you were given.  Keep all follow-up visits as told by your health care provider. This is important. Contact a health care provider if:  You develop new bleeding anywhere in the body. Get help right away if:  You are very weak.  You are short of breath.  You have pain in your abdomen or chest.  You are dizzy or feel faint.  You have trouble concentrating.  You have bloody or black, tarry stools.  You vomit repeatedly or you vomit up blood. Summary  Anemia is a condition in which you do not have enough red blood cells or enough of a substance in your red blood cells that carries oxygen (hemoglobin).  Symptoms may occur suddenly or develop slowly.  If your anemia is mild, you may not have symptoms.  This condition is diagnosed with blood tests as well as a medical history and physical exam. Other tests may be needed.  Treatment for this condition depends on the cause of the anemia. This information is not intended to replace advice   given to you by your health care provider. Make sure you discuss any questions you have with your health care provider. Document Released: 07/31/2004 Document Revised: 07/25/2016 Document Reviewed: 07/25/2016 Elsevier Interactive Patient Education  [employer].

## 2017-09-24 LAB — LIPID PANEL
CHOL/HDL RATIO: 3
CHOLESTEROL: 156 mg/dL (ref 0–200)
HDL: 50.9 mg/dL (ref >39.00)
LDL Cholesterol: 85 mg/dL (ref 0–99)
NonHDL: 104.83
TRIGLYCERIDES: 98 mg/dL (ref 0.0–149.0)
VLDL: 19.6 mg/dL (ref 0.0–40.0)

## 2017-09-24 LAB — BASIC METABOLIC PANEL
BUN: 58 mg/dL — AB (ref 6–23)
CALCIUM: 10.5 mg/dL (ref 8.4–10.5)
CO2: 25 meq/L (ref 19–32)
Chloride: 101 mEq/L (ref 96–112)
Creatinine, Ser: 4.12 mg/dL — ABNORMAL HIGH (ref 0.40–1.50)
GFR: 18.49 mL/min — AB (ref >60.00)
Glucose, Bld: 105 mg/dL — ABNORMAL HIGH (ref 70–99)
Potassium: 4 mEq/L (ref 3.5–5.1)
Sodium: 139 mEq/L (ref 135–145)

## 2017-09-24 MED ORDER — ROSUVASTATIN CALCIUM 5 MG PO TABS: 5.0000 mg | ORAL_TABLET | Freq: Every day | ORAL | 1 refills | Status: DC

## 2017-09-27 LAB — THYROID PANEL WITH TSH
Free Thyroxine Index: 2.5 (ref 1.4–3.8)
T3 UPTAKE: 32 % (ref 22–35)
T4, Total: 7.9 ug/dL (ref 4.9–10.5)
TSH: 5.41 mIU/L — ABNORMAL HIGH (ref 0.40–4.50)

## 2017-09-27 LAB — VITAMIN B1: Vitamin B1 (Thiamine): 17 nmol/L (ref 8–30)

## 2017-09-29 ENCOUNTER — Telehealth: Payer: Self-pay | Admitting: Internal Medicine

## 2017-09-29 NOTE — Telephone Encounter (Signed)
Copied from [facility] [place] [id number]. Topic: General - Other >> Sep 29, 2017 12:48 PM Yvette Rack wrote: Reason for CRM: patient calling about lab results

## 2017-09-29 NOTE — Telephone Encounter (Signed)
Lab Results  Component Value Date   WBC 5.0 09/23/2017   HGB 11.0 (L) 09/23/2017   HCT 34.5 (L) 09/23/2017   PLT 193.0 09/23/2017   GLUCOSE 105 (H) 09/23/2017   CHOL 156 09/23/2017   TRIG 98.0 09/23/2017   HDL 50.90 09/23/2017   LDLDIRECT 120.0 06/04/2015   LDLCALC 85 09/23/2017   ALT 14 (L) 10/17/2016   AST 18 10/17/2016   NA 139 09/23/2017   K 4.0 09/23/2017   CL 101 09/23/2017   CREATININE 4.12 (H) 09/23/2017   BUN 58 (H) 09/23/2017   CO2 25 09/23/2017   TSH 5.41 (H) 09/23/2017   PSA 1.86 08/10/2013   INR 1.11 11/16/2015   HGBA1C 6.0 12/20/2009    He was mildly anemic but his B12 level was normal. His anemia was a little better than it was last time. I do not think he needs any more B12 injections. The anemia is probably related to his kidney disease.  The other labs are all okay.  I do not see any other concerns here.

## 2017-09-30 NOTE — Telephone Encounter (Signed)
Pt contacted and informed of results.

## 2017-10-06 ENCOUNTER — Encounter: Payer: Self-pay | Admitting: Internal Medicine

## 2017-10-06 ENCOUNTER — Telehealth: Payer: Self-pay

## 2017-10-06 ENCOUNTER — Ambulatory Visit: Payer: Medicare Other | Admitting: Internal Medicine

## 2017-10-06 VITALS — BP 140/80 | HR 80 | Temp 99.1°F | Resp 16 | Ht 68.0 in | Wt 209.1 lb

## 2017-10-06 DIAGNOSIS — K529 Noninfective gastroenteritis and colitis, unspecified: Secondary | ICD-10-CM | POA: Diagnosis not present

## 2017-10-06 NOTE — Progress Notes (Signed)
Subjective:  Patient ID: Jason Cotta [name]., male    DOB: December 26, 1945  Age: 72 y.o. MRN: [id number]  CC: Diarrhea   HPI Jason KNAUFF [name]. presents for concerns about a 5-day history of intermittent diarrhea.  He has had a couple of loose stools every day for the last 5 days.  He thought the symptoms were related to the statin and potassium so he has stopped taking them and the diarrhea has resolved.  He denies abdominal pain, nausea, vomiting, or loss of appetite.  Outpatient Medications Prior to Visit  Medication Sig Dispense Refill  . Acetaminophen (TYLENOL 8 HOUR PO) Take by mouth.    Marland Kitchen allopurinol (ZYLOPRIM) 300 MG tablet Take 1 tablet (300 mg total) by mouth daily. 90 tablet 0  . BIDIL 20-37.5 MG tablet Take 0.05 tablets by mouth 3 (three) times daily.    . cholecalciferol (VITAMIN D) 1000 units tablet Take 5,000 Units by mouth daily.    Marland Kitchen doxazosin (CARDURA) 8 MG tablet Take 1 tablet (8 mg total) by mouth daily. 90 tablet 0  . furosemide (LASIX) 80 MG tablet TAKE 1 TABLET BY MOUTH  DAILY 90 tablet 1  . gabapentin (NEURONTIN) 300 MG capsule Take 300 mg by mouth 3 (three) times daily. Take 300mg s morning and 600mg s at bedtime    . KLOR-CON M20 20 MEQ tablet Take 1 tablet (20 mEq total) by mouth daily. 90 tablet 0  . loperamide (IMODIUM) 2 MG capsule Take 2 mg by mouth as needed for diarrhea or loose stools.    . metoprolol tartrate (LOPRESSOR) 50 MG tablet Take 50 mg by mouth 2 (two) times daily.    . polycarbophil (FIBERCON) 625 MG tablet Take 1,250 mg by mouth daily.    . rosuvastatin (CRESTOR) 5 MG tablet Take 1 tablet (5 mg total) by mouth daily. (Patient not taking: Reported on 10/06/2017) 90 tablet 1   No facility-administered medications prior to visit.     ROS Review of Systems  Constitutional: Negative for appetite change, diaphoresis, fatigue and unexpected weight change.  HENT: Negative.   Eyes: Negative for visual disturbance.  Respiratory: Negative for cough, chest  tightness, shortness of breath and wheezing.   Cardiovascular: Negative for chest pain, palpitations and leg swelling.  Gastrointestinal: Positive for diarrhea. Negative for abdominal pain, constipation, nausea and vomiting.  Endocrine: Negative.   Genitourinary: Negative.  Negative for difficulty urinating, dysuria, hematuria and urgency.  Musculoskeletal: Negative for back pain and myalgias.  Skin: Negative.  Negative for color change and pallor.  Neurological: Negative.  Negative for dizziness, weakness, light-headedness and headaches.  Hematological: Negative for adenopathy. Does not bruise/bleed easily.  Psychiatric/Behavioral: Negative.     Objective:  BP 140/80 (BP Location: Right Arm, Patient Position: Sitting, Cuff Size: Large)   Pulse 80   Temp 99.1 F (37.3 C) (Oral)   Resp 16   Ht 5\' 8"  (1.727 m)   Wt 209 lb 1.9 oz (94.9 kg)   SpO2 97%   BMI 31.80 kg/m   BP Readings from Last 3 Encounters:  10/06/17 140/80  09/23/17 (!) 148/80  09/08/17 133/70    Wt Readings from Last 3 Encounters:  10/06/17 209 lb 1.9 oz (94.9 kg)  09/23/17 207 lb (93.9 kg)  09/02/17 210 lb (95.3 kg)    Physical Exam  Constitutional:  Non-toxic appearance. He does not have a sickly appearance. He does not appear ill. No distress.  HENT:  Mouth/Throat: Oropharynx is clear and moist. No oropharyngeal exudate.  Eyes: Conjunctivae are normal. Left eye exhibits no discharge. No scleral icterus.  Neck: Normal range of motion. Neck supple. No JVD present. No thyromegaly present.  Cardiovascular: Normal rate, regular rhythm and normal heart sounds. Exam reveals no gallop and no friction rub.  No murmur heard. Pulmonary/Chest: Effort normal and breath sounds normal. No respiratory distress. He has no wheezes. He has no rales.  Abdominal: Soft. Bowel sounds are normal. He exhibits no distension and no mass. There is no tenderness. There is no guarding.  Musculoskeletal: Normal range of motion. He  exhibits no edema, tenderness or deformity.  Lymphadenopathy:    He has no cervical adenopathy.  Neurological: He is alert.  Skin: Skin is warm and dry. No rash noted. He is not diaphoretic. No erythema. No pallor.  Vitals reviewed.   Lab Results  Component Value Date   WBC 5.0 09/23/2017   HGB 11.0 (L) 09/23/2017   HCT 34.5 (L) 09/23/2017   PLT 193.0 09/23/2017   GLUCOSE 105 (H) 09/23/2017   CHOL 156 09/23/2017   TRIG 98.0 09/23/2017   HDL 50.90 09/23/2017   LDLDIRECT 120.0 06/04/2015   LDLCALC 85 09/23/2017   ALT 14 (L) 10/17/2016   AST 18 10/17/2016   NA 139 09/23/2017   K 4.0 09/23/2017   CL 101 09/23/2017   CREATININE 4.12 (H) 09/23/2017   BUN 58 (H) 09/23/2017   CO2 25 09/23/2017   TSH 5.41 (H) 09/23/2017   PSA 1.86 08/10/2013   INR 1.11 11/16/2015   HGBA1C 6.0 12/20/2009    No results found.  Assessment & Plan:   Wendal was seen today for diarrhea.  Diagnoses and all orders for this visit:  Gastroenteritis, acute- His symptoms sound more like viral gastroenteritis.  Since the diarrhea has resolved I have asked him to rechallenge himself with the statin and the potassium and indeed if the diarrhea returns then he should stop those and we will discuss other treatment options.  For now he will increase his intake of liquids and will let me know if he develops any new or worsening symptoms.   I am having Jason Cotta [name]. "Jason [name]" maintain his gabapentin, loperamide, polycarbophil, Acetaminophen (TYLENOL 8 HOUR PO), furosemide, allopurinol, doxazosin, KLOR-CON M20, BIDIL, cholecalciferol, metoprolol tartrate, and rosuvastatin.  No orders of the defined types were placed in this encounter.    Follow-up: No follow-ups on file.  [name], MD

## 2017-10-06 NOTE — Telephone Encounter (Signed)
Copied from [place] [id number]. Topic: General - Other >> Oct 05, 2017  8:05 AM Carolyn Stare wrote:  Pt call to say the below med is upsetting his stomach and giving him diahrr   KLOR-CON M20 20 MEQ tablet   l   rosuvastatin (CRESTOR) 5 MG tablet   Pt phone [phone]

## 2017-10-06 NOTE — Patient Instructions (Signed)
Viral Gastroenteritis, Adult  Viral gastroenteritis is also known as the stomach flu. This condition is caused by various viruses. These viruses can be passed from person to person very easily (are very contagious). This condition may affect your stomach, small intestine, and large intestine. It can cause sudden watery diarrhea, fever, and vomiting.  Diarrhea and vomiting can make you feel weak and cause you to become dehydrated. You may not be able to keep fluids down. Dehydration can make you tired and thirsty, cause you to have a dry mouth, and decrease how often you urinate. Older adults and people with other diseases or a weak immune system are at higher risk for dehydration.  It is important to replace the fluids that you lose from diarrhea and vomiting. If you become severely dehydrated, you may need to get fluids through an IV tube.  What are the causes?  Gastroenteritis is caused by various viruses, including rotavirus and norovirus. Norovirus is the most common cause in adults.  You can get sick by eating food, drinking water, or touching a surface contaminated with one of these viruses. You can also get sick from sharing utensils or other personal items with an infected person.  What increases the risk?  This condition is more likely to develop in people:  · Who have a weak defense system (immune system).  · Who live with one or more children who are younger than 2 years old.  · Who live in a nursing home.  · Who go on cruise ships.    What are the signs or symptoms?  Symptoms of this condition start suddenly 1-2 days after exposure to a virus. Symptoms may last a few days or as long as a week. The most common symptoms are watery diarrhea and vomiting. Other symptoms include:  · Fever.  · Headache.  · Fatigue.  · Pain in the abdomen.  · Chills.  · Weakness.  · Nausea.  · Muscle aches.  · Loss of appetite.    How is this diagnosed?  This condition is diagnosed with a medical history and physical exam. You  may also have a stool test to check for viruses or other infections.  How is this treated?  This condition typically goes away on its own. The focus of treatment is to restore lost fluids (rehydration). Your health care provider may recommend that you take an oral rehydration solution (ORS) to replace important salts and minerals (electrolytes) in your body. Severe cases of this condition may require giving fluids through an IV tube.  Treatment may also include medicine to help with your symptoms.  Follow these instructions at home:  Follow instructions from your health care provider about how to care for yourself at home.  Eating and drinking  Follow these recommendations as told by your health care provider:  · Take an ORS. This is a drink that is sold at pharmacies and retail stores.  · Drink clear fluids in small amounts as you are able. Clear fluids include water, ice chips, diluted fruit juice, and low-calorie sports drinks.  · Eat bland, easy-to-digest foods in small amounts as you are able. These foods include bananas, applesauce, rice, lean meats, toast, and crackers.  · Avoid fluids that contain a lot of sugar or caffeine, such as energy drinks, sports drinks, and soda.  · Avoid alcohol.  · Avoid spicy or fatty foods.    General instructions    · Drink enough fluid to keep your urine clear or   pale yellow.  · Wash your hands often. If soap and water are not available, use hand sanitizer.  · Make sure that all people in your household wash their hands well and often.  · Take over-the-counter and prescription medicines only as told by your health care provider.  · Rest at home while you recover.  · Watch your condition for any changes.  · Take a warm bath to relieve any burning or pain from frequent diarrhea episodes.  · Keep all follow-up visits as told by your health care provider. This is important.  Contact a health care provider if:  · You cannot keep fluids down.  · Your symptoms get worse.  · You have  new symptoms.  · You feel light-headed or dizzy.  · You have muscle cramps.  Get help right away if:  · You have chest pain.  · You feel extremely weak or you faint.  · You see blood in your vomit.  · Your vomit looks like coffee grounds.  · You have bloody or black stools or stools that look like tar.  · You have a severe headache, a stiff neck, or both.  · You have a rash.  · You have severe pain, cramping, or bloating in your abdomen.  · You have trouble breathing or you are breathing very quickly.  · Your heart is beating very quickly.  · Your skin feels cold and clammy.  · You feel confused.  · You have pain when you urinate.  · You have signs of dehydration, such as:  ? Dark urine, very little urine, or no urine.  ? Cracked lips.  ? Dry mouth.  ? Sunken eyes.  ? Sleepiness.  ? Weakness.  This information is not intended to replace advice given to you by your health care provider. Make sure you discuss any questions you have with your health care provider.  Document Released: 06/23/2005 Document Revised: 12/05/2015 Document Reviewed: 02/27/2015  Elsevier Interactive Patient Education © 2018 [employer]

## 2017-10-06 NOTE — Telephone Encounter (Signed)
I reviewed medication side effects and I could not find anything about potassium or rosuvastatin causing diarrhea.     Please advise - we can get patient in for an appointment

## 2017-11-09 DIAGNOSIS — N189 Chronic kidney disease, unspecified: Secondary | ICD-10-CM | POA: Diagnosis not present

## 2017-11-09 DIAGNOSIS — N2581 Secondary hyperparathyroidism of renal origin: Secondary | ICD-10-CM | POA: Diagnosis not present

## 2017-11-09 DIAGNOSIS — N185 Chronic kidney disease, stage 5: Secondary | ICD-10-CM | POA: Diagnosis not present

## 2017-11-16 DIAGNOSIS — N185 Chronic kidney disease, stage 5: Secondary | ICD-10-CM | POA: Diagnosis not present

## 2017-11-16 DIAGNOSIS — D631 Anemia in chronic kidney disease: Secondary | ICD-10-CM | POA: Diagnosis not present

## 2017-11-16 DIAGNOSIS — N2581 Secondary hyperparathyroidism of renal origin: Secondary | ICD-10-CM | POA: Diagnosis not present

## 2017-11-16 DIAGNOSIS — I120 Hypertensive chronic kidney disease with stage 5 chronic kidney disease or end stage renal disease: Secondary | ICD-10-CM | POA: Diagnosis not present

## 2017-11-26 ENCOUNTER — Other Ambulatory Visit: Payer: Self-pay | Admitting: Internal Medicine

## 2017-11-26 DIAGNOSIS — I10 Essential (primary) hypertension: Secondary | ICD-10-CM

## 2017-11-27 ENCOUNTER — Telehealth: Payer: Self-pay | Admitting: Oncology

## 2017-11-27 NOTE — Telephone Encounter (Signed)
Due to GI Clinic template change 7/09 f/u moved to 5/31. Date/time for lab 5/28 remains the same. Called patient re change and per patient he will call us back.

## 2017-12-01 ENCOUNTER — Ambulatory Visit (HOSPITAL_COMMUNITY)
Admission: RE | Admit: 2017-12-01 | Discharge: 2017-12-01 | Disposition: A | Discharge status: Home / Self Care | Payer: Medicare Other | Source: Ambulatory Visit | Attending: Nurse Practitioner | Admitting: Nurse Practitioner

## 2017-12-01 ENCOUNTER — Inpatient Hospital Stay: Payer: Medicare Other | Attending: Oncology

## 2017-12-01 DIAGNOSIS — Z85048 Personal history of other malignant neoplasm of rectum, rectosigmoid junction, and anus: Secondary | ICD-10-CM | POA: Diagnosis not present

## 2017-12-01 DIAGNOSIS — I700 Atherosclerosis of aorta: Secondary | ICD-10-CM | POA: Diagnosis not present

## 2017-12-01 DIAGNOSIS — J984 Other disorders of lung: Secondary | ICD-10-CM | POA: Insufficient documentation

## 2017-12-01 DIAGNOSIS — C20 Malignant neoplasm of rectum: Secondary | ICD-10-CM

## 2017-12-01 DIAGNOSIS — N400 Enlarged prostate without lower urinary tract symptoms: Secondary | ICD-10-CM | POA: Insufficient documentation

## 2017-12-01 DIAGNOSIS — J929 Pleural plaque without asbestos: Secondary | ICD-10-CM | POA: Diagnosis not present

## 2017-12-01 DIAGNOSIS — N2889 Other specified disorders of kidney and ureter: Secondary | ICD-10-CM | POA: Insufficient documentation

## 2017-12-01 DIAGNOSIS — N401 Enlarged prostate with lower urinary tract symptoms: Secondary | ICD-10-CM | POA: Diagnosis not present

## 2017-12-01 DIAGNOSIS — R911 Solitary pulmonary nodule: Secondary | ICD-10-CM | POA: Diagnosis not present

## 2017-12-01 DIAGNOSIS — I2510 Atherosclerotic heart disease of native coronary artery without angina pectoris: Secondary | ICD-10-CM | POA: Insufficient documentation

## 2017-12-01 DIAGNOSIS — K5730 Diverticulosis of large intestine without perforation or abscess without bleeding: Secondary | ICD-10-CM | POA: Insufficient documentation

## 2017-12-02 LAB — CEA (IN HOUSE-CHCC): CEA (CHCC-IN HOUSE): 1.39 ng/mL (ref 0.00–5.00)

## 2017-12-03 ENCOUNTER — Ambulatory Visit: Payer: Medicare Other | Admitting: Oncology

## 2017-12-04 ENCOUNTER — Ambulatory Visit: Payer: Medicare Other | Admitting: Oncology

## 2017-12-04 NOTE — Progress Notes (Signed)
Patient called. Verbalized an understanding of results.

## 2017-12-07 ENCOUNTER — Telehealth: Payer: Self-pay | Admitting: Oncology

## 2017-12-07 ENCOUNTER — Inpatient Hospital Stay: Payer: Medicare Other | Attending: Oncology | Admitting: Oncology

## 2017-12-07 VITALS — BP 148/80 | HR 100 | Temp 98.8°F | Resp 18 | Ht 68.0 in | Wt 213.3 lb

## 2017-12-07 DIAGNOSIS — R918 Other nonspecific abnormal finding of lung field: Secondary | ICD-10-CM | POA: Diagnosis not present

## 2017-12-07 DIAGNOSIS — I10 Essential (primary) hypertension: Secondary | ICD-10-CM | POA: Insufficient documentation

## 2017-12-07 DIAGNOSIS — C20 Malignant neoplasm of rectum: Secondary | ICD-10-CM

## 2017-12-07 DIAGNOSIS — Z85048 Personal history of other malignant neoplasm of rectum, rectosigmoid junction, and anus: Secondary | ICD-10-CM | POA: Diagnosis not present

## 2017-12-07 NOTE — Telephone Encounter (Signed)
Scheduled appt per 6/3 los - Gave patient AVS and calender per los.

## 2017-12-07 NOTE — Progress Notes (Signed)
[facility] OFFICE PROGRESS NOTE   Diagnosis: Rectal cancer  INTERVAL HISTORY:   Jason [name] returns as scheduled.  He feels well.  Good appetite.  He reports the Lasix dose was recently increased secondary to leg edema.  Objective:  Vital signs in last 24 hours:  Blood pressure (!) 148/80, pulse 100, temperature 98.8 F (37.1 C), temperature source Oral, resp. rate 18, height 5\' 8"  (1.727 m), weight 213 lb 4.8 oz (96.8 kg), SpO2 100 %.    HEENT: Neck without mass Lymphatics: No cervical, supraclavicular, axillary, or inguinal nodes Resp: End inspiratory rhonchi at the left lower posterior chest, no respiratory distress Cardio: Regular rate and rhythm GI: No hepatospleno megaly, no mass, nontender Vascular: 2+ edema below the knee bilaterally   Lab Results:  Lab Results  Component Value Date   WBC 5.0 09/23/2017   HGB 11.0 (L) 09/23/2017   HCT 34.5 (L) 09/23/2017   MCV 86.6 09/23/2017   PLT 193.0 09/23/2017   NEUTROABS 3.5 09/23/2017    CMP  Lab Results  Component Value Date   NA 139 09/23/2017   K 4.0 09/23/2017   CL 101 09/23/2017   CO2 25 09/23/2017   GLUCOSE 105 (H) 09/23/2017   BUN 58 (H) 09/23/2017   CREATININE 4.12 (H) 09/23/2017   CALCIUM 10.5 09/23/2017   PROT 7.4 10/17/2016   ALBUMIN 4.1 10/17/2016   AST 18 10/17/2016   ALT 14 (L) 10/17/2016   ALKPHOS 58 10/17/2016   BILITOT 0.4 10/17/2016   GFRNONAA 13 (L) 10/17/2016   GFRAA 15 (L) 10/17/2016    Lab Results  Component Value Date   CEA1 1.39 12/01/2017    Lab Results  Component Value Date   INR 1.11 11/16/2015    Imaging:  No results found.  Medications: I have reviewed the patient's current medications.   Assessment/Plan:  1. Rectal cancer, clinical stage III (uT3, uN2).  Initiation of concurrent Xeloda and radiation on 02/21/2013, completed 03/31/2013.   CEA normal 04/25/2013   Status post laparoscopic low anterior resection with diverting loop ileostomy  05/23/2013. Final pathology showed a 3.2 cm invasive adenocarcinoma with neoadjuvant related change. Tumor invaded into perirectal soft tissue. There was no lymphovascular or perineural invasion. 14 lymph nodes were negative for tumor. Surgical margins were negative. There was one hyperplastic polyp (ypT3, pN0).   Initiation of weekly 5-FU/leucovorin 08/01/2013   Ileostomy takedown 08/24/2013.   Weekly 5-FU/leucovorin resumed 09/22/2013.  Restaging CTs 02/20/2014 negative for evidence of recurrent disease  restaging CT scans 04/05/2015 negative for evidence of recurrent disease  Surveillance colonoscopy 03/31/2014, status post removal of of a tubular adenoma from the ascending colon  Enlarging left posterior medial upper lobe nodule on chest CT 04/05/2015 and 07/12/2015  PET scan 10/22/2015 with a hypermetabolic nodule in the medial aspect of the left upper lobe, other lung nodules stable  Left lung wedge resection 11/20/2015 confirmed a 1 cm focus of metastatic adenocarcinoma of colorectal origin, resection margins and multiple lymph nodes negative  CT scans 03/17/2016-bandlike scarring along the left upper lobe nodule resection with a soft tissue density component; calcified bilateral pleural plaques; no findings of recurrence of the abdomen or pelvis.  CTs 09/30/2016-negative for progressive disease  CTs 06/05/2017-new pulmonary nodule right upper lobe. Stable pleural nodularity within the left and right lung. Stable nodular thickening along the left upper lobe resection margin. No evidence of metastasis in the abdomen/pelvis. Interval enlargement of a solid lesion in the medial aspect of the right kidney  most consistent with a benign lesion based on a previous PET scan, the "new "right upper lobe nodule was present on a PET scan 10/22/2015 and similar in size-favored to be benign  CTs 12/01/2017-no evidence of progressive rectal cancer, stable clustered right upper lobe pulmonary  nodules, no new nodules  2. Markedly elevated pretreatment CEA 3. Indeterminate pulmonary/subpleural nodules and right hepatic dome lesion. Unchanged on the CT 04/25/2013, unchanged nodular lung lesions on the CT 02/20/2014, see follow-up CTs/PET above 4. Renal insufficiency. Acute on chronic renal failure during hospitalization November/December 2014. Improved following the ileostomy takedown.  He is followed by nephrology. 5. History of Anemia secondary chemotherapy, chronic disease, and renal insufficiency 6. Hypertension. 7. Gout. 8. Prolonged postoperative ileus following laparoscopic low anterior resection with diverting loop ileostomy 05/23/2013. 9. C. difficile-positive 09/08/2013-status post Flagyl with improvement. Recurrent diarrhea following completion of Flagyl. Stool positive for C. difficile on 09/24/2013. He completed treatment with vancomycin. The diarrhea resolved. 10. Left greater than right low leg edema. Venous Doppler 09/26/2013 negative for left leg DVT. 11. Rectal urgency/incontinence-Improved with participation in the pelvic physical therapy clinic.   Disposition: Jason [name] remains in clinical remission from rectal cancer.  The restaging CT shows no evidence of disease progression.  He will return for an office visit and CEA in 4 months.  He continue follow-up with nephrology for management of the lower extremity edema.  15 minutes were spent with the patient today.  The majority of the time was used for counseling and coordination of care. Betsy Coder, MD  12/07/2017  3:40 PM

## 2017-12-09 ENCOUNTER — Encounter: Payer: Self-pay | Admitting: Internal Medicine

## 2017-12-09 DIAGNOSIS — I429 Cardiomyopathy, unspecified: Secondary | ICD-10-CM | POA: Diagnosis not present

## 2017-12-11 ENCOUNTER — Other Ambulatory Visit: Payer: Self-pay | Admitting: Internal Medicine

## 2017-12-11 DIAGNOSIS — I10 Essential (primary) hypertension: Secondary | ICD-10-CM

## 2017-12-18 DIAGNOSIS — I429 Cardiomyopathy, unspecified: Secondary | ICD-10-CM | POA: Diagnosis not present

## 2017-12-18 DIAGNOSIS — I10 Essential (primary) hypertension: Secondary | ICD-10-CM | POA: Diagnosis not present

## 2017-12-18 DIAGNOSIS — R600 Localized edema: Secondary | ICD-10-CM | POA: Diagnosis not present

## 2017-12-18 DIAGNOSIS — I5020 Unspecified systolic (congestive) heart failure: Secondary | ICD-10-CM | POA: Diagnosis not present

## 2017-12-28 DIAGNOSIS — H401212 Low-tension glaucoma, right eye, moderate stage: Secondary | ICD-10-CM | POA: Diagnosis not present

## 2017-12-28 DIAGNOSIS — H40033 Anatomical narrow angle, bilateral: Secondary | ICD-10-CM | POA: Diagnosis not present

## 2017-12-28 DIAGNOSIS — H401221 Low-tension glaucoma, left eye, mild stage: Secondary | ICD-10-CM | POA: Diagnosis not present

## 2017-12-28 LAB — HM DIABETES EYE EXAM

## 2017-12-28 LAB — HM DIABETES FOOT EXAM

## 2017-12-30 DIAGNOSIS — G4733 Obstructive sleep apnea (adult) (pediatric): Secondary | ICD-10-CM | POA: Diagnosis not present

## 2017-12-31 ENCOUNTER — Other Ambulatory Visit: Payer: Self-pay | Admitting: *Deleted

## 2017-12-31 NOTE — Patient Outreach (Signed)
[facility]) Care Management  12/31/2017  Jason OVERLEY Sr. 07/23/1945 [id number]    Telephone Screen  Referral Date: 12/30/17 Referral Source: self referral as recommended by MD Referral Reason: Dr Recommended him to call  Insurance: united healthcare medicare  Outreach attempt #1 No answer. [facility] RN CM left HIPAA compliant voicemail message along with CM's contact info.  Conditions:  HTN, OSA, rectal cancer, OAB, gout, deficiency anemia, obesity, Hyperlipidemia, s/p partial lobectomy of lung, Hereditary and idiopathic peripheral neuropathy  Plan: [facility] RN CM sent an unsuccessful outreach letter and scheduled this patient for another call attempt within 4 business days    [name], RN, BSN, CCM [facility] Telephonic Care Management Care Coordinator Direct number [phone]  Main [facility] number [phone] Fax number [phone]

## 2018-01-01 ENCOUNTER — Other Ambulatory Visit: Payer: Self-pay | Admitting: *Deleted

## 2018-01-01 ENCOUNTER — Telehealth: Payer: Self-pay | Admitting: Internal Medicine

## 2018-01-01 NOTE — Telephone Encounter (Signed)
Copied from [place] [id number]. Topic: Referral - Question >> Jan 01, 2018  2:14 PM Mylinda Latina, NT wrote: Reason for CRM: Joelene Millin calling from [facility] stating that the patient self referred himself to them . Patient told them that Dr. Ronnald Ramp told him to call. Joelene Millin did an assessment and determined that the patient has not needs for [facility]. Please call Joelene Millin to let her know if there is something that she should know regarding this patient. CB# [phone]

## 2018-01-01 NOTE — Patient Outreach (Signed)
[facility]) Care Management  01/01/2018  Jason RATTERREE Sr. July 15, 1945 [id number]   Telephone Screen  Referral Date: 12/30/17 Referral Source: self referral as recommended by MD Referral Reason: Dr Recommended him to call  Insurance: united healthcare medicare   Outreach attempt # 2 successful Patient is able to verify HIPAA Reviewed and addressed referral to [facility] with patient Mr Labrie and Mrs Roulston denies needs.  Mr Chiu reports he was told to call by MD but is not sure what MD wanted [facility] to assist with.   CM completed screening questions and Mr & Mrs Hanna (with Mr Noguez permission for Cm to speak to Mrs Garrette) denied any needs  Social: Mr [place]hoite lives with his wife,Phyllis but reports he is independent with all his care. He informs CM his health is poor but denies concerns with home management of medical conditions, support system, DME, medicines or transportation Last admission was on 03/24/16. Denies falls Mrs Bartow states he gets around good    Conditions: HTN, hx of colon cancer s/p surgery per MR Weil, OSA with use of CPAP Medications: Mr Lard confirms he gets his medicines delivered and takes them as ordered.  Denies concerns with taking medications as prescribed, affording medications, side effects of medications and questions about medications  Appointments: Advance Directives: Mr Palmeri confirms he had advance directives completed with his oncologist.  Denies need for assist with or assist with changes for advance directives   Consent: [facility] RN CM reviewed [facility] services with patient. Patient denies need for services.  Plan: [facility] RN CM will consult Dr Ronnald Ramp and plan for case closure as discussed with Mr and Mrs Devincenzi if no needs identified and per their agreement for closure if no needs identified  [facility] RN Cm spoke with Dawn at Dr Ronnald Ramp office to inquire if there was a particular need Dr Ronnald Ramp was interested in Having [facility] work with Mr Vivanco on  message was sent by [employer] L. Lavina Hamman, RN, BSN, CCM [facility] Telephonic Care Management Care Coordinator Direct number [phone]  Main [facility] number [phone] Fax number [phone]

## 2018-01-04 ENCOUNTER — Other Ambulatory Visit: Payer: Self-pay | Admitting: *Deleted

## 2018-01-04 NOTE — Patient Outreach (Signed)
[facility]) Care Management  01/04/2018  Jason TILGHMAN Sr. 1946/01/27 [id number]   Case closure  [facility] RN CM did not receive any follow up calls from pcp Dr Ronnald Ramp office at this time  Plans [facility] RN CM will close case at this time as patient has been assessed and no needs identified and pt and wife agree on case closure   [name], RN, BSN, CCM [facility] Telephonic Care Management Care Coordinator Direct number [phone]  Main [facility] number [phone] Fax number [phone]

## 2018-02-05 ENCOUNTER — Other Ambulatory Visit: Payer: Self-pay | Admitting: Internal Medicine

## 2018-02-05 DIAGNOSIS — I10 Essential (primary) hypertension: Secondary | ICD-10-CM

## 2018-02-16 DIAGNOSIS — D631 Anemia in chronic kidney disease: Secondary | ICD-10-CM | POA: Diagnosis not present

## 2018-02-16 DIAGNOSIS — N2581 Secondary hyperparathyroidism of renal origin: Secondary | ICD-10-CM | POA: Diagnosis not present

## 2018-02-16 DIAGNOSIS — N185 Chronic kidney disease, stage 5: Secondary | ICD-10-CM | POA: Diagnosis not present

## 2018-02-25 DIAGNOSIS — D631 Anemia in chronic kidney disease: Secondary | ICD-10-CM | POA: Diagnosis not present

## 2018-02-25 DIAGNOSIS — I120 Hypertensive chronic kidney disease with stage 5 chronic kidney disease or end stage renal disease: Secondary | ICD-10-CM | POA: Diagnosis not present

## 2018-02-25 DIAGNOSIS — N2581 Secondary hyperparathyroidism of renal origin: Secondary | ICD-10-CM | POA: Diagnosis not present

## 2018-02-25 DIAGNOSIS — N185 Chronic kidney disease, stage 5: Secondary | ICD-10-CM | POA: Diagnosis not present

## 2018-03-03 ENCOUNTER — Other Ambulatory Visit (HOSPITAL_COMMUNITY): Payer: Self-pay

## 2018-03-04 ENCOUNTER — Ambulatory Visit (HOSPITAL_COMMUNITY)
Admission: RE | Admit: 2018-03-04 | Discharge: 2018-03-04 | Disposition: A | Discharge status: Home / Self Care | Payer: Medicare Other | Source: Ambulatory Visit | Attending: Nephrology | Admitting: Nephrology

## 2018-03-04 DIAGNOSIS — D631 Anemia in chronic kidney disease: Secondary | ICD-10-CM | POA: Diagnosis not present

## 2018-03-04 DIAGNOSIS — N189 Chronic kidney disease, unspecified: Secondary | ICD-10-CM | POA: Diagnosis not present

## 2018-03-04 MED ORDER — SODIUM CHLORIDE 0.9 % IV SOLN
510.0000 mg | INTRAVENOUS | Status: DC
  Administered 2018-03-04: 510 mg via INTRAVENOUS
  Filled 2018-03-04: qty 17

## 2018-03-10 ENCOUNTER — Other Ambulatory Visit (HOSPITAL_COMMUNITY): Payer: Self-pay

## 2018-03-11 ENCOUNTER — Ambulatory Visit (HOSPITAL_COMMUNITY)
Admission: RE | Admit: 2018-03-11 | Discharge: 2018-03-11 | Disposition: A | Discharge status: Home / Self Care | Payer: Medicare Other | Source: Ambulatory Visit | Attending: Nephrology | Admitting: Nephrology

## 2018-03-11 DIAGNOSIS — D631 Anemia in chronic kidney disease: Secondary | ICD-10-CM | POA: Insufficient documentation

## 2018-03-11 DIAGNOSIS — N189 Chronic kidney disease, unspecified: Secondary | ICD-10-CM | POA: Insufficient documentation

## 2018-03-11 MED ORDER — SODIUM CHLORIDE 0.9 % IV SOLN
510.0000 mg | INTRAVENOUS | Status: AC
  Administered 2018-03-11: 510 mg via INTRAVENOUS
  Filled 2018-03-11: qty 17

## 2018-03-22 DIAGNOSIS — I429 Cardiomyopathy, unspecified: Secondary | ICD-10-CM | POA: Diagnosis not present

## 2018-03-22 DIAGNOSIS — I10 Essential (primary) hypertension: Secondary | ICD-10-CM | POA: Diagnosis not present

## 2018-03-22 DIAGNOSIS — I5020 Unspecified systolic (congestive) heart failure: Secondary | ICD-10-CM | POA: Diagnosis not present

## 2018-03-22 DIAGNOSIS — R600 Localized edema: Secondary | ICD-10-CM | POA: Diagnosis not present

## 2018-03-29 ENCOUNTER — Encounter: Payer: Self-pay | Admitting: Internal Medicine

## 2018-03-29 ENCOUNTER — Ambulatory Visit: Payer: Medicare Other | Admitting: Internal Medicine

## 2018-03-29 VITALS — BP 142/78 | HR 90 | Ht 68.0 in | Wt 205.8 lb

## 2018-03-29 DIAGNOSIS — R931 Abnormal findings on diagnostic imaging of heart and coronary circulation: Secondary | ICD-10-CM | POA: Diagnosis not present

## 2018-03-29 DIAGNOSIS — I10 Essential (primary) hypertension: Secondary | ICD-10-CM

## 2018-03-29 NOTE — Progress Notes (Signed)
ELECTROPHYSIOLOGY CONSULT NOTE  Patient ID: Jason [name]., MRN: [id number], DOB/AGE: [birth year] 71 y.o. Admit date: (Not on file) Date of Consult: 03/29/2018  Primary Physician: [name], MD Primary Cardiologist: MP     Jason Cotta Sr. is a 72 y.o. male who is being seen today for the evaluation of ICD at the request of MP.    HPI Jason MARZELLA Sr. is a 72 y.o. male with a persistent cardiomyopathy ejection fraction 20% most recently 6/19 despite renal failure restricted optimal medical therapy.  He has modest dyspnea on exertion.  Denies peripheral edema or chest discomfort.  No nocturnal dyspnea orthopnea.  No palpitations no syncope.  Medical history is notable for also for metastatic colon cancer.  2017 metastases were identified on PET scan to his lung.  Resection.  Serial imaging since then has shown no clear new malignancy Past Medical History:  Diagnosis Date  . Anemia   . Arthritis   . CELLULITIS, HAND, RIGHT 08/27/2007   Qualifier: Diagnosis of  By: Sherren Mocha MD, Jory Ee   . Chronic renal insufficiency 07/31/2011  . COLONIC POLYPS, HX OF 05/11/2007   Qualifier: Diagnosis of  By: Sherren Mocha MD, Jory Ee   . COPD (chronic obstructive pulmonary disease) ([place])    pt denies  . Cough 12/18/2015  . Deficiency anemia 07/20/2012  . Diverticulosis   . Edema 09/21/2010   Qualifier: Diagnosis of  By: Jerold Coombe    . ERECTILE DYSFUNCTION, MILD 05/11/2007   Qualifier: Diagnosis of  By: Sherren Mocha MD, Jory Ee   . Exertional dyspnea 10/30/2015  . Gastroenteritis, acute 10/06/2017  . GOUT 05/11/2007   Qualifier: Diagnosis of  By: Sherren Mocha MD, Jory Ee   . Hereditary and idiopathic peripheral neuropathy 09/14/2014  . History of radiation therapy 02/21/13-03/31/13   rectum 50.4Gy total dose  . Hyperplastic colon polyp   . Hypersomnia 10/30/2015  . Hypertension   . LRTI (lower respiratory tract infection) 12/18/2015  . Lung nodule 10/30/2015  . OAB (overactive bladder) 01/09/2016  .  Obesity 01/15/2016  . Obesity (BMI 30.0-34.9) 10/30/2015  . Obstructive sleep apnea 07/29/2010   HST 11/2015 AHI 54.  On autocpap  >> 10 cm   . Peripheral edema 09/19/2013  . rectal ca dx'd 02/01/13   rectal. Radiation and chemotherapy- remains on chemotherapy-next tx. 08-22-13.   . S/P partial lobectomy of lung 11/20/2015  . S/P thoracotomy   . Sleep apnea    uses cpap  . SUI (stress urinary incontinence), male 01/09/2016  . Viral gastroenteritis 10/18/2016  . VIRAL URI 05/31/2008   Qualifier: Diagnosis of  By: Sherren Mocha MD, Jory Ee       Surgical History:  Past Surgical History:  Procedure Laterality Date  . AV FISTULA PLACEMENT Left 03/24/2016   Procedure: CREATION OF LEFT BRACIOCEPHALIC ARTERIOVENOUS (AV) FISTULA;  Surgeon: Elam Dutch, MD;  Location: [facility];  Service: Vascular;  Laterality: Left;  . COLON SURGERY  05/20/2013  . COLONOSCOPY  2010   [facility]  . EUS N/A 02/03/2013   Procedure: LOWER ENDOSCOPIC ULTRASOUND (EUS);  Surgeon: Milus Banister, MD;  Location: [facility];  Service: Endoscopy;  Laterality: N/A;  . ILEOSTOMY CLOSURE N/A 08/24/2013   Procedure: CLOSURE OF LOOP ILEOSTOMY ;  Surgeon: Leighton Ruff, MD;  Location: [facility];  Service: General;  Laterality: N/A;  . LAPAROSCOPIC LOW ANTERIOR RESECTION N/A 05/20/2013   Procedure: LAPAROSCOPIC LOW ANTERIOR RESECTION WITH SPLENIC FLEXURE MOBILIZATION;  Surgeon:  Leighton Ruff, MD;  Location: [facility];  Service: General;  Laterality: N/A;  . OSTOMY N/A 05/20/2013   Procedure: diverting OSTOMY;  Surgeon: Leighton Ruff, MD;  Location: [facility];  Service: General;  Laterality: N/A;  . VIDEO ASSISTED THORACOSCOPY (VATS)/WEDGE RESECTION Left 11/20/2015   Procedure: VIDEO ASSISTED THORACOSCOPY (VATS)/LUNG RESECTION;  Surgeon: [name], MD;  Location: [facility];  Service: Thoracic;  Laterality: Left;  Marland Kitchen VIDEO BRONCHOSCOPY N/A 11/20/2015   Procedure: VIDEO BRONCHOSCOPY;  Surgeon: [name], MD;  Location: [facility];  Service:  Thoracic;  Laterality: N/A;     Home Meds: Prior to Admission medications   Medication Sig Start Date End Date Taking? Authorizing Provider  Acetaminophen (TYLENOL 8 HOUR PO) Take by mouth.    [provider]  allopurinol (ZYLOPRIM) 300 MG tablet Take 1 tablet (300 mg total) by mouth daily. 12/11/17   [name], MD  BIDIL 20-37.5 MG tablet Take 0.05 tablets by mouth 3 (three) times daily. 09/09/17   [provider]  cholecalciferol (VITAMIN D) 1000 units tablet Take 5,000 Units by mouth daily.    [provider]  doxazosin (CARDURA) 8 MG tablet Take 1 tablet (8 mg total) by mouth daily. 02/05/18   [name], MD  furosemide (LASIX) 80 MG tablet TAKE 1 TABLET BY MOUTH  DAILY 05/12/17   [name], MD  gabapentin (NEURONTIN) 300 MG capsule Take 300 mg by mouth 3 (three) times daily. Take 300mg s morning and 600mg s at bedtime 08/31/14   [provider]  KLOR-CON M20 20 MEQ tablet Take 1 tablet (20 mEq total) by mouth daily. 12/11/17   [name], MD  loperamide (IMODIUM) 2 MG capsule Take 2 mg by mouth as needed for diarrhea or loose stools.    [provider]  metoprolol succinate (TOPROL-XL) 50 MG 24 hr tablet  10/20/17   [provider]  metoprolol tartrate (LOPRESSOR) 50 MG tablet Take 50 mg by mouth 2 (two) times daily.    [provider]  polycarbophil (FIBERCON) 625 MG tablet Take 1,250 mg by mouth daily.    [provider]  rosuvastatin (CRESTOR) 5 MG tablet Take 1 tablet (5 mg total) by mouth daily. Patient not taking: Reported on 10/06/2017 09/24/17   [name], MD    Allergies:  Allergies  Allergen Reactions  . Nsaids Other (See Comments)    Asked by surgeon to add this medication class as intolerance due to patients renal insufficiency.  . Amlodipine Other (See Comments)    Edema     Social History   Socioeconomic History  . Marital status: Married    Spouse name: Jason [name]  . Number of  children: 2  . Years of education: 18  . Highest education level: Not on file  Occupational History    Comment: retired Insurance account manager  Social Needs  . Financial resource strain: Not on file  . Food insecurity:    Worry: Not on file    Inability: Not on file  . Transportation needs:    Medical: Not on file    Non-medical: Not on file  Tobacco Use  . Smoking status: Former Smoker    Packs/day: 0.50    Years: 10.00    Pack years: 5.00    Types: Cigarettes    Last attempt to quit: 07/07/1977    Years since quitting: 40.7  . Smokeless tobacco: Never Used  Substance and Sexual Activity  . Alcohol use: Yes  Alcohol/week: 0.0 standard drinks    Comment: drinks occasionally  . Drug use: No  . Sexual activity: Not on file  Lifestyle  . Physical activity:    Days per week: Not on file    Minutes per session: Not on file  . Stress: Not on file  Relationships  . Social connections:    Talks on phone: Not on file    Gets together: Not on file    Attends religious service: Not on file    Active member of club or organization: Not on file    Attends meetings of clubs or organizations: Not on file    Relationship status: Not on file  . Intimate partner violence:    Fear of current or ex partner: Not on file    Emotionally abused: Not on file    Physically abused: Not on file    Forced sexual activity: Not on file  Other Topics Concern  . Not on file  Social History Narrative   Married to wife, Jason [name]   Retired Insurance account manager w/[employer]   #2 grown children and #6 grandchildren     Family History  Problem Relation Age of Onset  . Stroke Mother   . Hypertension Mother   . Stroke Father   . Hypertension Father   . Hypertension Other   . Colon cancer Neg Hx      ROS:  Please see the history of present illness.     All other systems reviewed and negative.    Physical Exam: Blood pressure (!) 142/78, pulse 90, height 5\' 8"  (1.727 m), weight 205  lb 12.8 oz (93.4 kg), SpO2 98 %. General: Well developed, well nourished male in no acute distress. Head: Normocephalic, atraumatic, sclera non-icteric, no xanthomas, nares are without discharge. EENT: normal  Lymph Nodes:  none Neck: Negative for carotid bruits. JVD 8-10 Back:without scoliosis kyphosis Lungs: Clear bilaterally to auscultation without wheezes, rales, or rhonchi. Breathing is unlabored. Heart: RRR with S1 S2.  2/6 systolic murmur .  To and fro murmur is heard radiating into the left arm associated with his fistula no rubs, or gallops appreciated. Abdomen: Soft, non-tender, non-distended with normoactive bowel sounds. No hepatomegaly. No rebound/guarding. No obvious abdominal masses. Msk:  Strength and tone appear normal for age. Extremities: No clubbing or cyanosis. No edema.  Distal pedal pulses are 2+ and equal bilaterally. Skin: Warm and Dry Neuro: Alert and oriented X 3. CN III-XII intact Grossly normal sensory and motor function . Psych:  Responds to questions appropriately with a normal affect.      Labs: Cardiac Enzymes No results for input(s): CKTOTAL, CKMB, TROPONINI in the last 72 hours. CBC Lab Results  Component Value Date   WBC 5.0 09/23/2017   HGB 11.0 (L) 09/23/2017   HCT 34.5 (L) 09/23/2017   MCV 86.6 09/23/2017   PLT 193.0 09/23/2017   PROTIME: No results for input(s): LABPROT, INR in the last 72 hours. Chemistry No results for input(s): NA, K, CL, CO2, BUN, CREATININE, CALCIUM, PROT, BILITOT, ALKPHOS, ALT, AST, GLUCOSE in the last 168 hours.  Invalid input(s): LABALBU Lipids Lab Results  Component Value Date   CHOL 156 09/23/2017   HDL 50.90 09/23/2017   LDLCALC 85 09/23/2017   TRIG 98.0 09/23/2017   BNP No results found for: PROBNP Thyroid Function Tests: No results for input(s): TSH, T4TOTAL, T3FREE, THYROIDAB in the last 72 hours.  Invalid input(s): FREET3 Miscellaneous No results found for: DDIMER  Radiology/Studies:  No  results found.  EKG: Sinus rhythm at 90 excitable 21/09/38 Axis 56 PVCs-frequent 6/42    ..>>  14%   Assessment and Plan:   Cardiomyopathy, presumed nonischemic  Chronic Kidney disease gd 4/5 (GFR <20)  Adenocarcinoma colo rectal 2014 with pulm metastasis 11/17 and new nodule 2018 ? Benign   Congestive Heart Failure  Class 2-3  PVCs frequent  Chemotherapy with 5 FU and Capecitabine    The patient has a presumed nonischemic cardiomyopathy with congestive heart failure in the context of significant comorbidities including near end-stage renal disease and known previously metastatic adenocarcinoma.  This in addition to his age, based on the [place] trial, make it less likely that there is a significant benefit to him in overall mortality reduction with an ICD.  I have reviewed these data with him.  Potential contributors to his cardiomyopathy which might afford improvement would include PVC suppression and/or revascularization if necessary.  The latter obviously needs to be considered in the context of his temporal trajectory towards dialysis.  The former could be undertaken with antiarrhythmic suppression trial i.e. Amiodarone.  His chemotherapy might well have been partly responsible--I am unable to find an echocardiogram around the time of his initial therapies.  We reviewed medical therapies for his cardiomyopathy.  Ideal is clearly appropriate.  He apparently has been tried on carvedilol in the past; I reviewed with him the data from, COMET is suggested that metoprolol succinate and/or bisoprolol might be better for him than metoprolol tartrate which she is currently taking once a day.  I will defer this to Dr. Wonda Amis  We will see him again as needed.       Virl Axe

## 2018-03-29 NOTE — Patient Instructions (Signed)
Medication Instructions:  Your physician recommends that you continue on your current medications as directed. Please refer to the Current Medication list given to you today.  Labwork: None ordered.  Testing/Procedures: None ordered.  Follow-Up: Your physician recommends that you schedule a follow-up appointment as needed with Dr Caryl Comes  Any Other Special Instructions Will Be Listed Below (If Applicable).     If you need a refill on your cardiac medications before your next appointment, please call your pharmacy.

## 2018-04-08 ENCOUNTER — Telehealth: Payer: Self-pay | Admitting: Oncology

## 2018-04-08 ENCOUNTER — Inpatient Hospital Stay: Payer: Medicare Other | Attending: Nurse Practitioner | Admitting: Nurse Practitioner

## 2018-04-08 ENCOUNTER — Encounter: Payer: Self-pay | Admitting: Nurse Practitioner

## 2018-04-08 ENCOUNTER — Inpatient Hospital Stay: Payer: Medicare Other

## 2018-04-08 VITALS — BP 147/82 | HR 93 | Temp 98.1°F | Resp 18 | Ht 68.0 in | Wt 205.1 lb

## 2018-04-08 DIAGNOSIS — M109 Gout, unspecified: Secondary | ICD-10-CM | POA: Diagnosis not present

## 2018-04-08 DIAGNOSIS — I10 Essential (primary) hypertension: Secondary | ICD-10-CM | POA: Insufficient documentation

## 2018-04-08 DIAGNOSIS — Z85048 Personal history of other malignant neoplasm of rectum, rectosigmoid junction, and anus: Secondary | ICD-10-CM | POA: Insufficient documentation

## 2018-04-08 DIAGNOSIS — Z9221 Personal history of antineoplastic chemotherapy: Secondary | ICD-10-CM

## 2018-04-08 DIAGNOSIS — C20 Malignant neoplasm of rectum: Secondary | ICD-10-CM

## 2018-04-08 DIAGNOSIS — R918 Other nonspecific abnormal finding of lung field: Secondary | ICD-10-CM | POA: Diagnosis not present

## 2018-04-08 DIAGNOSIS — Z923 Personal history of irradiation: Secondary | ICD-10-CM | POA: Diagnosis not present

## 2018-04-08 NOTE — Telephone Encounter (Signed)
Appts scheduled av/calendar printed per 10/3 los

## 2018-04-08 NOTE — Progress Notes (Addendum)
Jason [name]   Diagnosis: Rectal cancer  INTERVAL HISTORY:   Jason [name] returns as scheduled.  He overall feels well.  He has a good appetite.  Bowels moving regularly.  No bleeding or pain with bowel movements.  He has occasional abdominal pain.  No nausea or vomiting.  No shortness of breath.  Objective:  Vital signs in last 24 hours:  Blood pressure (!) 147/82, pulse 93, temperature 98.1 F (36.7 C), temperature source Oral, resp. rate 18, height 5\' 8"  (1.727 m), weight 205 lb 1.6 oz (93 kg), SpO2 99 %.    HEENT: Neck without mass. Lymphatics: No palpable cervical, supraclavicular, axillary or inguinal lymph nodes. Resp: Lungs clear bilaterally. Cardio: Regular rate and rhythm. GI: Abdomen soft and nontender.  No hepatospleno megaly.  No mass. Vascular: 1-2+ edema below the knees bilaterally.    Lab Results:  Lab Results  Component Value Date   WBC 5.0 09/23/2017   HGB 11.0 (L) 09/23/2017   HCT 34.5 (L) 09/23/2017   MCV 86.6 09/23/2017   PLT 193.0 09/23/2017   NEUTROABS 3.5 09/23/2017    Imaging:  No results found.  Medications: I have reviewed the patient's current medications.  Assessment/Plan: 1. Rectal cancer, clinical stage III (uT3, uN2).  Initiation of concurrent Xeloda and radiation on 02/21/2013, completed 03/31/2013.   CEA normal 04/25/2013   Status post laparoscopic low anterior resection with diverting loop ileostomy 05/23/2013. Final pathology showed a 3.2 cm invasive adenocarcinoma with neoadjuvant related change. Tumor invaded into perirectal soft tissue. There was no lymphovascular or perineural invasion. 14 lymph nodes were negative for tumor. Surgical margins were negative. There was one hyperplastic polyp (ypT3, pN0).   Initiation of weekly 5-FU/leucovorin 08/01/2013   Ileostomy takedown 08/24/2013.   Weekly 5-FU/leucovorin resumed 09/22/2013.  Restaging CTs 02/20/2014 negative for evidence of  recurrent disease  restaging CT scans 04/05/2015 negative for evidence of recurrent disease  Surveillance colonoscopy 03/31/2014, status post removal of of a tubular adenoma from the ascending colon  Enlarging left posterior medial upper lobe nodule on chest CT 04/05/2015 and 07/12/2015  PET scan 10/22/2015 with a hypermetabolic nodule in the medialaspect of the leftupper lobe, other lung nodules stable  Left lung wedge resection 11/20/2015 confirmed a 1 cm focus of metastatic adenocarcinoma of colorectal origin, resection margins and multiple lymph nodes negative  CT scans 03/17/2016-bandlike scarring along the left upper lobe nodule resection with a soft tissue density component; calcified bilateral pleural plaques; no findings of recurrence of the abdomen or pelvis.  CTs 09/30/2016-negative for progressive disease  CTs 06/05/2017-new pulmonary nodule right upper lobe. Stable pleural nodularity within the left and right lung. Stable nodular thickening along the left upper lobe resection margin. No evidence of metastasis in the abdomen/pelvis. Interval enlargement of a solid lesion in the medial aspect of the right kidney most consistent with a benign lesion based on a previous PET scan, the "new" right upper lobe nodule was present on a PET scan 10/22/2015 and similar in size-favored to be benign  CTs 12/01/2017-no evidence of progressive rectal cancer, stable clustered right upper lobe pulmonary nodules, no new nodules  2. Markedly elevated pretreatment CEA 3. Indeterminate pulmonary/subpleural nodules and right hepatic dome lesion. Unchanged on the CT 04/25/2013, unchanged nodular lung lesions on the CT 02/20/2014, see follow-up CTs/PET above 4. Renal insufficiency. Acute on chronic renal failure during hospitalization November/December 2014. Improved following the ileostomy takedown.He is followed by nephrology. 5. History of Anemia secondary chemotherapy, chronic disease,  and  renal insufficiency 6. Hypertension. 7. Gout. 8. Prolonged postoperative ileus following laparoscopic low anterior resection with diverting loop ileostomy 05/23/2013. 9. C. difficile-positive 09/08/2013-status post Flagyl with improvement. Recurrent diarrhea following completion of Flagyl. Stool positive for C. difficile on 09/24/2013. He completed treatment with vancomycin. The diarrhea resolved. 10. Left greater than right low leg edema. Venous Doppler 09/26/2013 negative for left leg DVT. 11. Rectal urgency/incontinence-Improved with participation in the pelvic physical therapy clinic.   Disposition: Jason [name] remains in clinical remission from rectal cancer.  We will follow-up on the CEA from today.  He will undergo restaging CTs in approximately 4 months.  We will see him back 2 to 3 days later to review the results.  Patient seen with Dr. Benay Spice.    Ned Card ANP/GNP-BC   04/08/2018  3:58 PM  This was a shared visit with Ned Card.  Jason [name] is in remission from rectal cancer.  He will return for an office visit and restaging CT evaluation in 4 months.  Julieanne Manson, MD

## 2018-04-09 LAB — CEA (IN HOUSE-CHCC): CEA (CHCC-IN HOUSE): 1.55 ng/mL (ref 0.00–5.00)

## 2018-04-09 NOTE — Progress Notes (Addendum)
Subjective:   Jason DOCKEN Sr. is a 72 y.o. male who presents for Medicare Annual/Subsequent preventive examination.  Review of Systems:  No ROS.  Medicare Wellness Visit. Additional risk factors are reflected in the social history.  Cardiac Risk Factors include: dyslipidemia;male gender Sleep patterns: feels rested on waking, gets up 1-2 times nightly to void and sleeps 8-9 hours nightly.  Wears C-PAP  Home Safety/Smoke Alarms: Feels safe in home. Smoke alarms in place.  Living environment; residence and Firearm Safety: 1-story house/ trailer, no firearms Lives with wife, no needs for DME, good support system. Seat Belt Safety/Bike Helmet: Wears seat belt.     Objective:    Vitals: BP (!) 146/82   Pulse 84   Resp 17   Ht 5\' 8"  (1.727 m)   Wt 206 lb (93.4 kg)   SpO2 100%   BMI 31.32 kg/m   Body mass index is 31.32 kg/m.  Advanced Directives 04/12/2018 01/01/2018 01/01/2018 04/10/2017 10/17/2016 06/05/2016 03/21/2016  Does Patient Have a Medical Advance Directive? Yes Yes No Yes Yes Yes Yes  Type of Paramedic of [place];Living will Healthcare Power of [place];Living will Healthcare Power of [place] of [place];Living will  Does patient want to make changes to medical advance directive? - - - - - - -  Copy of [place] in Chart? No - copy requested - - No - copy requested No - copy requested No - copy requested -  Would patient like information on creating a medical advance directive? - - No - Patient declined - - - -  Pre-existing out of facility DNR order (yellow form or pink MOST form) - - - - - - -    Tobacco Social History   Tobacco Use  Smoking Status Former Smoker  . Packs/day: 0.50  . Years: 10.00  . Pack years: 5.00  . Types: Cigarettes  . Last attempt to quit: 07/07/1977  . Years since quitting: 40.7  Smokeless Tobacco Never Used       Counseling given: Not Answered  Past Medical History:  Diagnosis Date  . Anemia   . Arthritis   . CELLULITIS, HAND, RIGHT 08/27/2007   Qualifier: Diagnosis of  By: Sherren Mocha MD, Jory Ee   . Chronic renal insufficiency 07/31/2011  . COLONIC POLYPS, HX OF 05/11/2007   Qualifier: Diagnosis of  By: Sherren Mocha MD, Jory Ee   . COPD (chronic obstructive pulmonary disease) ([place])    pt denies  . Cough 12/18/2015  . Deficiency anemia 07/20/2012  . Diverticulosis   . Edema 09/21/2010   Qualifier: Diagnosis of  By: Jerold Coombe    . ERECTILE DYSFUNCTION, MILD 05/11/2007   Qualifier: Diagnosis of  By: Sherren Mocha MD, Jory Ee   . Exertional dyspnea 10/30/2015  . Gastroenteritis, acute 10/06/2017  . GOUT 05/11/2007   Qualifier: Diagnosis of  By: Sherren Mocha MD, Jory Ee   . Hereditary and idiopathic peripheral neuropathy 09/14/2014  . History of radiation therapy 02/21/13-03/31/13   rectum 50.4Gy total dose  . Hyperplastic colon polyp   . Hypersomnia 10/30/2015  . Hypertension   . LRTI (lower respiratory tract infection) 12/18/2015  . Lung nodule 10/30/2015  . OAB (overactive bladder) 01/09/2016  . Obesity 01/15/2016  . Obesity (BMI 30.0-34.9) 10/30/2015  . Obstructive sleep apnea 07/29/2010   HST 11/2015 AHI 54.  On autocpap  >> 10 cm   . Peripheral edema 09/19/2013  .  rectal ca dx'd 02/01/13   rectal. Radiation and chemotherapy- remains on chemotherapy-next tx. 08-22-13.   . S/P partial lobectomy of lung 11/20/2015  . S/P thoracotomy   . Sleep apnea    uses cpap  . SUI (stress urinary incontinence), male 01/09/2016  . Viral gastroenteritis 10/18/2016  . VIRAL URI 05/31/2008   Qualifier: Diagnosis of  By: Sherren Mocha MD, Jory Ee    Past Surgical History:  Procedure Laterality Date  . AV FISTULA PLACEMENT Left 03/24/2016   Procedure: CREATION OF LEFT BRACIOCEPHALIC ARTERIOVENOUS (AV) FISTULA;  Surgeon: Elam Dutch, MD;  Location: [facility];  Service: Vascular;  Laterality: Left;  . COLON SURGERY  05/20/2013  . COLONOSCOPY   2010   [facility]  . EUS N/A 02/03/2013   Procedure: LOWER ENDOSCOPIC ULTRASOUND (EUS);  Surgeon: Milus Banister, MD;  Location: [facility];  Service: Endoscopy;  Laterality: N/A;  . ILEOSTOMY CLOSURE N/A 08/24/2013   Procedure: CLOSURE OF LOOP ILEOSTOMY ;  Surgeon: Leighton Ruff, MD;  Location: [facility];  Service: General;  Laterality: N/A;  . LAPAROSCOPIC LOW ANTERIOR RESECTION N/A 05/20/2013   Procedure: LAPAROSCOPIC LOW ANTERIOR RESECTION WITH SPLENIC FLEXURE MOBILIZATION;  Surgeon: Leighton Ruff, MD;  Location: [facility];  Service: General;  Laterality: N/A;  . OSTOMY N/A 05/20/2013   Procedure: diverting OSTOMY;  Surgeon: Leighton Ruff, MD;  Location: [facility];  Service: General;  Laterality: N/A;  . VIDEO ASSISTED THORACOSCOPY (VATS)/WEDGE RESECTION Left 11/20/2015   Procedure: VIDEO ASSISTED THORACOSCOPY (VATS)/LUNG RESECTION;  Surgeon: [name], MD;  Location: [facility];  Service: Thoracic;  Laterality: Left;  Marland Kitchen VIDEO BRONCHOSCOPY N/A 11/20/2015   Procedure: VIDEO BRONCHOSCOPY;  Surgeon: [name], MD;  Location: [facility];  Service: Thoracic;  Laterality: N/A;   Family History  Problem Relation Age of Onset  . Stroke Mother   . Hypertension Mother   . Stroke Father   . Hypertension Father   . Hypertension Other   . Colon cancer Neg Hx    Social History   Socioeconomic History  . Marital status: Married    Spouse name: [name]  . Number of children: 2  . Years of education: 41  . Highest education level: Not on file  Occupational History    Comment: retired Insurance account manager  Social Needs  . Financial resource strain: Not hard at all  . Food insecurity:    Worry: Never true    Inability: Never true  . Transportation needs:    Medical: No    Non-medical: No  Tobacco Use  . Smoking status: Former Smoker    Packs/day: 0.50    Years: 10.00    Pack years: 5.00    Types: Cigarettes    Last attempt to quit: 07/07/1977    Years since quitting: 40.7  . Smokeless tobacco:  Never Used  Substance and Sexual Activity  . Alcohol use: Yes    Alcohol/week: 0.0 standard drinks    Comment: drinks occasionally  . Drug use: No  . Sexual activity: Not Currently  Lifestyle  . Physical activity:    Days per week: 6 days    Minutes per session: 30 min  . Stress: Not at all  Relationships  . Social connections:    Talks on phone: More than three times a week    Gets together: More than three times a week    Attends religious service: More than 4 times per year    Active member of club or organization: Yes  Attends meetings of clubs or organizations: More than 4 times per year    Relationship status: Married  Other Topics Concern  . Not on file  Social History Narrative   Married to wife, [name]   Retired Insurance account manager w/[employer]   #2 grown children and #6 grandchildren    Outpatient Encounter Medications as of 04/12/2018  Medication Sig  . Acetaminophen (TYLENOL 8 HOUR PO) Take by mouth daily as needed.   Marland Kitchen allopurinol (ZYLOPRIM) 300 MG tablet Take 1 tablet (300 mg total) by mouth daily.  Marland Kitchen BIDIL 20-37.5 MG tablet Take 1 tablet by mouth daily.   . cholecalciferol (VITAMIN D) 1000 units tablet Take 5,000 Units by mouth daily.  Marland Kitchen doxazosin (CARDURA) 8 MG tablet Take 1 tablet (8 mg total) by mouth daily.  . furosemide (LASIX) 80 MG tablet TAKE 1 TABLET BY MOUTH  DAILY  . gabapentin (NEURONTIN) 300 MG capsule Take 300 mg by mouth 3 (three) times daily. Take 300mg s morning and 600mg s at bedtime  . KLOR-CON M20 20 MEQ tablet Take 1 tablet (20 mEq total) by mouth daily.  Marland Kitchen loperamide (IMODIUM) 2 MG capsule Take 2 mg by mouth as needed for diarrhea or loose stools.  . metoprolol tartrate (LOPRESSOR) 50 MG tablet Take 50 mg by mouth daily.   . polycarbophil (FIBERCON) 625 MG tablet Take 1,250 mg by mouth daily.   No facility-administered encounter medications on file as of 04/12/2018.     Activities of Daily Living In your present  state of health, do you have any difficulty performing the following activities: 04/12/2018 09/08/2017  Hearing? N N  Vision? N N  Difficulty concentrating or making decisions? N N  Walking or climbing stairs? N N  Dressing or bathing? N N  Doing errands, shopping? N -  Preparing Food and eating ? N -  Using the Toilet? N -  In the past six months, have you accidently leaked urine? N -  Do you have problems with loss of bowel control? N -  Managing your Medications? N -  Managing your Finances? N -  Housekeeping or managing your Housekeeping? N -  Some recent data might be hidden    Patient Care Team: [name], MD as PCP - General (Internal Medicine) Lafayette Dragon, MD (Inactive) as Attending Physician (Gastroenterology) Leighton Ruff, MD as Consulting Physician (General Surgery) [name], MD as Consulting Physician (Oncology) Elmarie Shiley, MD as Consulting Physician (Nephrology) Monna Fam, MD as Consulting Physician (Ophthalmology) Josue Hector, MD as Consulting Physician (Cardiology)   Assessment:   This is a routine wellness examination for Jason [name]. Physical assessment deferred to PCP.   Exercise Activities and Dietary recommendations Current Exercise Habits: Home exercise routine, Time (Minutes): 30, Frequency (Times/Week): 6, Weekly Exercise (Minutes/Week): 180, Intensity: Mild, Exercise limited by: None identified  Diet (meal preparation, eat out, water intake, caffeinated beverages, dairy products, fruits and vegetables): in general, a "healthy" diet     Education provided regarded renal diet.  Discussed supplementing with nepro, samples and coupons provided. Relevant patient education assigned to patient using Emmi.   Goals    . Patient Stated     Continue to exercise and learn about diet for my kidneys. Enjoy life and go fishing as much as possible.    . Stay as healthy as I can be     Continue to follow my renal diet and exercise, enjoy life,  family, and fish as much  as possible.       Fall Risk Fall Risk  04/12/2018 04/10/2017 10/14/2016 06/04/2015 08/17/2014  Falls in the past year? No No No No No   Depression Screen PHQ 2/9 Scores 04/12/2018 01/01/2018 05/19/2017 04/10/2017  PHQ - 2 Score 0 0 1 1  PHQ- 9 Score - - - 1    Cognitive Function MMSE - Mini Mental State Exam 04/12/2018 04/10/2017  Orientation to time 5 5  Orientation to Place 5 5  Registration 3 3  Attention/ Calculation 4 4  Recall 1 2  Language- name 2 objects 2 2  Language- repeat 1 1  Language- follow 3 step command 3 3  Language- read & follow direction 1 1  Write a sentence 1 1  Copy design 1 1  Total score 27 28        Immunization History  Administered Date(s) Administered  . Influenza Whole 05/15/2009, 06/05/2010  . Influenza, High Dose Seasonal PF 04/27/2015, 04/29/2016, 04/10/2017  . Influenza,inj,Quad PF,6+ Mos 07/14/2013, 04/10/2014  . Pneumococcal Conjugate-13 06/04/2015  . Pneumococcal Polysaccharide-23 07/20/2012, 08/26/2013  . Td 07/07/1997, 04/27/2008  . Tdap 11/19/2016   Screening Tests Health Maintenance  Topic Date Due  . INFLUENZA VACCINE  02/04/2018  . COLONOSCOPY  05/27/2021  . TETANUS/TDAP  11/20/2026  . Hepatitis C Screening  Completed  . PNA vac Low Risk Adult  Completed       Plan:    Continue doing brain stimulating activities (puzzles, reading, adult coloring books, staying active) to keep memory sharp.   Continue to eat heart healthy diet (full of fruits, vegetables, whole grains, lean protein, water--limit salt, fat, and sugar intake) and increase physical activity as tolerated.  I have personally reviewed and noted the following in the patient's chart:   . Medical and social history . Use of alcohol, tobacco or illicit drugs  . Current medications and supplements . Functional ability and status . Nutritional status . Physical activity . Advanced directives . List of other  physicians . Vitals . Screenings to include cognitive, depression, and falls . Referrals and appointments  In addition, I have reviewed and discussed with patient certain preventive protocols, quality metrics, and best practice recommendations. A written personalized care plan for preventive services as well as general preventive health recommendations were provided to patient.     Michiel Cowboy, RN  04/12/2018  Medical screening examination/treatment/procedure(s) were performed by non-physician practitioner and as supervising physician I was immediately available for consultation/collaboration. I agree with above. [name], MD

## 2018-04-12 ENCOUNTER — Ambulatory Visit (INDEPENDENT_AMBULATORY_CARE_PROVIDER_SITE_OTHER): Payer: Medicare Other | Admitting: *Deleted

## 2018-04-12 ENCOUNTER — Telehealth: Payer: Self-pay | Admitting: Nurse Practitioner

## 2018-04-12 VITALS — BP 146/82 | HR 84 | Resp 17 | Ht 68.0 in | Wt 206.0 lb

## 2018-04-12 DIAGNOSIS — Z23 Encounter for immunization: Secondary | ICD-10-CM

## 2018-04-12 DIAGNOSIS — Z0000 Encounter for general adult medical examination without abnormal findings: Secondary | ICD-10-CM

## 2018-04-12 NOTE — Telephone Encounter (Signed)
-----   Message from Owens Shark, NP sent at 04/12/2018  3:09 PM EDT ----- Please let him know CEA is normal.  Follow-up as scheduled.

## 2018-04-12 NOTE — Telephone Encounter (Signed)
Left Pt. A message to return call to [facility].

## 2018-04-12 NOTE — Patient Instructions (Addendum)
Continue doing brain stimulating activities (puzzles, reading, adult coloring books, staying active) to keep memory sharp.   Continue to eat heart healthy diet (full of fruits, vegetables, whole grains, lean protein, water--limit salt, fat, and sugar intake) and increase physical activity as tolerated.   Jason [name] , Thank you for taking time to come for your Medicare Wellness Visit. I appreciate your ongoing commitment to your health goals. Please review the following plan we discussed and let me know if I can assist you in the future.   These are the goals we discussed: Goals    . Patient Stated     Continue to exercise and learn about diet for my kidneys. Enjoy life and go fishing as much as possible.    . Stay as healthy as I can be     Continue to follow my renal diet and exercise, enjoy life, family, and fish as much as possible.       This is a list of the screening recommended for you and due dates:  Health Maintenance  Topic Date Due  . Flu Shot  02/04/2018  . Colon Cancer Screening  05/27/2021  . Tetanus Vaccine  11/20/2026  .  Hepatitis C: One time screening is recommended by Center for Disease Control  (CDC) for  adults born from [birth year] through 1964.   Completed  . Pneumonia vaccines  Completed     Health Maintenance, Male A healthy lifestyle and preventive care is important for your health and wellness. Ask your health care provider about what schedule of regular examinations is right for you. What should I know about weight and diet? Eat a Healthy Diet  Eat plenty of vegetables, fruits, whole grains, low-fat dairy products, and lean protein.  Do not eat a lot of foods high in solid fats, added sugars, or salt.  Maintain a Healthy Weight Regular exercise can help you achieve or maintain a healthy weight. You should:  Do at least 150 minutes of exercise each week. The exercise should increase your heart rate and make you sweat (moderate-intensity exercise).  Do  strength-training exercises at least twice a week.  Watch Your Levels of Cholesterol and Blood Lipids  Have your blood tested for lipids and cholesterol every 5 years starting at 71 years of age. If you are at high risk for heart disease, you should start having your blood tested when you are 71 years old. You may need to have your cholesterol levels checked more often if: ? Your lipid or cholesterol levels are high. ? You are older than 71 years of age. ? You are at high risk for heart disease.  What should I know about cancer screening? Many types of cancers can be detected early and may often be prevented. Lung Cancer  You should be screened every year for lung cancer if: ? You are a current smoker who has smoked for at least 30 years. ? You are a former smoker who has quit within the past 15 years.  Talk to your health care provider about your screening options, when you should start screening, and how often you should be screened.  Colorectal Cancer  Routine colorectal cancer screening usually begins at 71 years of age and should be repeated every 5-10 years until you are 71 years old. You may need to be screened more often if early forms of precancerous polyps or small growths are found. Your health care provider may recommend screening at an earlier age if you have risk  factors for colon cancer.  Your health care provider may recommend using home test kits to check for hidden blood in the stool.  A small camera at the end of a tube can be used to examine your colon (sigmoidoscopy or colonoscopy). This checks for the earliest forms of colorectal cancer.  Prostate and Testicular Cancer  Depending on your age and overall health, your health care provider may do certain tests to screen for prostate and testicular cancer.  Talk to your health care provider about any symptoms or concerns you have about testicular or prostate cancer.  Skin Cancer  Check your skin from head to toe  regularly.  Tell your health care provider about any new moles or changes in moles, especially if: ? There is a change in a mole's size, shape, or color. ? You have a mole that is larger than a pencil eraser.  Always use sunscreen. Apply sunscreen liberally and repeat throughout the day.  Protect yourself by wearing long sleeves, pants, a wide-brimmed hat, and sunglasses when outside.  What should I know about heart disease, diabetes, and high blood pressure?  If you are 62-49 years of age, have your blood pressure checked every 3-5 years. If you are 2 years of age or older, have your blood pressure checked every year. You should have your blood pressure measured twice-once when you are at a hospital or clinic, and once when you are not at a hospital or clinic. Record the average of the two measurements. To check your blood pressure when you are not at a hospital or clinic, you can use: ? An automated blood pressure machine at a pharmacy. ? A home blood pressure monitor.  Talk to your health care provider about your target blood pressure.  If you are between 72-55 years old, ask your health care provider if you should take aspirin to prevent heart disease.  Have regular diabetes screenings by checking your fasting blood sugar level. ? If you are at a normal weight and have a low risk for diabetes, have this test once every three years after the age of 75. ? If you are overweight and have a high risk for diabetes, consider being tested at a younger age or more often.  A one-time screening for abdominal aortic aneurysm (AAA) by ultrasound is recommended for men aged 27-75 years who are current or former smokers. What should I know about preventing infection? Hepatitis B If you have a higher risk for hepatitis B, you should be screened for this virus. Talk with your health care provider to find out if you are at risk for hepatitis B infection. Hepatitis C Blood testing is recommended  for:  Everyone born from [birth year] through 1964.  Anyone with known risk factors for hepatitis C.  Sexually Transmitted Diseases (STDs)  You should be screened each year for STDs including gonorrhea and chlamydia if: ? You are sexually active and are younger than 71 years of age. ? You are older than 71 years of age and your health care provider tells you that you are at risk for this type of infection. ? Your sexual activity has changed since you were last screened and you are at an increased risk for chlamydia or gonorrhea. Ask your health care provider if you are at risk.  Talk with your health care provider about whether you are at high risk of being infected with HIV. Your health care provider may recommend a prescription medicine to help prevent HIV infection.  What else can I do?  Schedule regular health, dental, and eye exams.  Stay current with your vaccines (immunizations).  Do not use any tobacco products, such as cigarettes, chewing tobacco, and e-cigarettes. If you need help quitting, ask your health care provider.  Limit alcohol intake to no more than 2 drinks per day. One drink equals 12 ounces of beer, 5 ounces of [place], or 1 ounces of hard liquor.  Do not use street drugs.  Do not share needles.  Ask your health care provider for help if you need support or information about quitting drugs.  Tell your health care provider if you often feel depressed.  Tell your health care provider if you have ever been abused or do not feel safe at home. This information is not intended to replace advice given to you by your health care provider. Make sure you discuss any questions you have with your health care provider. Document Released: 12/20/2007 Document Revised: 02/20/2016 Document Reviewed: 03/27/2015 Elsevier Interactive Patient Education  2018 [place].  Influenza Virus Vaccine injection What is this medicine? INFLUENZA VIRUS VACCINE (in floo EN zuh VAHY ruhs vak  SEEN) helps to reduce the risk of getting influenza also known as the flu. The vaccine only helps protect you against some strains of the flu. This medicine may be used for other purposes; ask your health care provider or pharmacist if you have questions. COMMON BRAND NAME(S): Afluria, Agriflu, Alfuria, FLUAD, Fluarix, Fluarix Quadrivalent, Flublok, Flublok Quadrivalent, FLUCELVAX, Flulaval, Fluvirin, Fluzone, Fluzone High-Dose, Fluzone Intradermal What should I tell my health care provider before I take this medicine? They need to know if you have any of these conditions: -bleeding disorder like hemophilia -fever or infection -Guillain-Barre syndrome or other neurological problems -immune system problems -infection with the human immunodeficiency virus (HIV) or AIDS -low blood platelet counts -multiple sclerosis -an unusual or allergic reaction to influenza virus vaccine, latex, other medicines, foods, dyes, or preservatives. Different brands of vaccines contain different allergens. Some may contain latex or eggs. Talk to your doctor about your allergies to make sure that you get the right vaccine. -pregnant or trying to get pregnant -breast-feeding How should I use this medicine? This vaccine is for injection into a muscle or under the skin. It is given by a health care professional. A copy of Vaccine Information Statements will be given before each vaccination. Read this sheet carefully each time. The sheet may change frequently. Talk to your healthcare provider to see which vaccines are right for you. Some vaccines should not be used in all age groups. Overdosage: If you think you have taken too much of this medicine contact a poison control center or emergency room at once. NOTE: This medicine is only for you. Do not share this medicine with others. What if I miss a dose? This does not apply. What may interact with this medicine? -chemotherapy or radiation therapy -medicines that lower  your immune system like etanercept, anakinra, infliximab, and adalimumab -medicines that treat or prevent blood clots like warfarin -phenytoin -steroid medicines like prednisone or cortisone -theophylline -vaccines This list may not describe all possible interactions. Give your health care provider a list of all the medicines, herbs, non-prescription drugs, or dietary supplements you use. Also tell them if you smoke, drink alcohol, or use illegal drugs. Some items may interact with your medicine. What should I watch for while using this medicine? Report any side effects that do not go away within 3 days to your doctor or  health care professional. Call your health care provider if any unusual symptoms occur within 6 weeks of receiving this vaccine. You may still catch the flu, but the illness is not usually as bad. You cannot get the flu from the vaccine. The vaccine will not protect against colds or other illnesses that may cause fever. The vaccine is needed every year. What side effects may I notice from receiving this medicine? Side effects that you should report to your doctor or health care professional as soon as possible: -allergic reactions like skin rash, itching or hives, swelling of the face, lips, or tongue Side effects that usually do not require medical attention (report to your doctor or health care professional if they continue or are bothersome): -fever -headache -muscle aches and pains -pain, tenderness, redness, or swelling at the injection site -tiredness This list may not describe all possible side effects. Call your doctor for medical advice about side effects. You may report side effects to FDA at 1-800-FDA-1088. Where should I keep my medicine? The vaccine will be given by a health care professional in a clinic, pharmacy, doctor's office, or other health care setting. You will not be given vaccine doses to store at home. NOTE: This sheet is a summary. It may not cover all  possible information. If you have questions about this medicine, talk to your doctor, pharmacist, or health care provider.  2018 Elsevier/Gold Standard (2015-01-12 10:07:28)

## 2018-04-22 DIAGNOSIS — I120 Hypertensive chronic kidney disease with stage 5 chronic kidney disease or end stage renal disease: Secondary | ICD-10-CM | POA: Diagnosis not present

## 2018-04-22 DIAGNOSIS — N185 Chronic kidney disease, stage 5: Secondary | ICD-10-CM | POA: Diagnosis not present

## 2018-04-23 DIAGNOSIS — G4733 Obstructive sleep apnea (adult) (pediatric): Secondary | ICD-10-CM | POA: Diagnosis not present

## 2018-05-08 ENCOUNTER — Inpatient Hospital Stay (HOSPITAL_COMMUNITY)
Admission: EM | Admit: 2018-05-08 | Discharge: 2018-05-10 | DRG: 683 | Disposition: A | Discharge status: Home / Self Care | Payer: Medicare Other | Attending: Internal Medicine | Admitting: Internal Medicine

## 2018-05-08 ENCOUNTER — Emergency Department (HOSPITAL_COMMUNITY): Payer: Medicare Other

## 2018-05-08 ENCOUNTER — Encounter (HOSPITAL_COMMUNITY): Payer: Self-pay

## 2018-05-08 DIAGNOSIS — N189 Chronic kidney disease, unspecified: Secondary | ICD-10-CM | POA: Diagnosis present

## 2018-05-08 DIAGNOSIS — N185 Chronic kidney disease, stage 5: Secondary | ICD-10-CM | POA: Diagnosis not present

## 2018-05-08 DIAGNOSIS — S81802A Unspecified open wound, left lower leg, initial encounter: Secondary | ICD-10-CM | POA: Diagnosis present

## 2018-05-08 DIAGNOSIS — G4733 Obstructive sleep apnea (adult) (pediatric): Secondary | ICD-10-CM | POA: Diagnosis present

## 2018-05-08 DIAGNOSIS — I10 Essential (primary) hypertension: Secondary | ICD-10-CM | POA: Diagnosis present

## 2018-05-08 DIAGNOSIS — S81801A Unspecified open wound, right lower leg, initial encounter: Secondary | ICD-10-CM | POA: Diagnosis present

## 2018-05-08 DIAGNOSIS — S81801S Unspecified open wound, right lower leg, sequela: Secondary | ICD-10-CM | POA: Diagnosis not present

## 2018-05-08 DIAGNOSIS — R531 Weakness: Secondary | ICD-10-CM | POA: Diagnosis not present

## 2018-05-08 DIAGNOSIS — E876 Hypokalemia: Secondary | ICD-10-CM | POA: Diagnosis not present

## 2018-05-08 DIAGNOSIS — Z923 Personal history of irradiation: Secondary | ICD-10-CM

## 2018-05-08 DIAGNOSIS — G609 Hereditary and idiopathic neuropathy, unspecified: Secondary | ICD-10-CM | POA: Diagnosis present

## 2018-05-08 DIAGNOSIS — D649 Anemia, unspecified: Secondary | ICD-10-CM | POA: Diagnosis present

## 2018-05-08 DIAGNOSIS — I951 Orthostatic hypotension: Secondary | ICD-10-CM | POA: Diagnosis not present

## 2018-05-08 DIAGNOSIS — I132 Hypertensive heart and chronic kidney disease with heart failure and with stage 5 chronic kidney disease, or end stage renal disease: Secondary | ICD-10-CM | POA: Diagnosis present

## 2018-05-08 DIAGNOSIS — R42 Dizziness and giddiness: Secondary | ICD-10-CM

## 2018-05-08 DIAGNOSIS — I509 Heart failure, unspecified: Secondary | ICD-10-CM

## 2018-05-08 DIAGNOSIS — Z87891 Personal history of nicotine dependence: Secondary | ICD-10-CM

## 2018-05-08 DIAGNOSIS — M109 Gout, unspecified: Secondary | ICD-10-CM | POA: Diagnosis present

## 2018-05-08 DIAGNOSIS — Z79899 Other long term (current) drug therapy: Secondary | ICD-10-CM

## 2018-05-08 DIAGNOSIS — Z888 Allergy status to other drugs, medicaments and biological substances status: Secondary | ICD-10-CM

## 2018-05-08 DIAGNOSIS — R0902 Hypoxemia: Secondary | ICD-10-CM | POA: Diagnosis not present

## 2018-05-08 DIAGNOSIS — Z743 Need for continuous supervision: Secondary | ICD-10-CM | POA: Diagnosis not present

## 2018-05-08 DIAGNOSIS — W19XXXS Unspecified fall, sequela: Secondary | ICD-10-CM | POA: Diagnosis not present

## 2018-05-08 DIAGNOSIS — R001 Bradycardia, unspecified: Secondary | ICD-10-CM | POA: Diagnosis not present

## 2018-05-08 DIAGNOSIS — Z886 Allergy status to analgesic agent status: Secondary | ICD-10-CM

## 2018-05-08 DIAGNOSIS — C20 Malignant neoplasm of rectum: Secondary | ICD-10-CM

## 2018-05-08 DIAGNOSIS — N179 Acute kidney failure, unspecified: Principal | ICD-10-CM | POA: Diagnosis present

## 2018-05-08 DIAGNOSIS — S81802S Unspecified open wound, left lower leg, sequela: Secondary | ICD-10-CM | POA: Diagnosis not present

## 2018-05-08 DIAGNOSIS — Z85048 Personal history of other malignant neoplasm of rectum, rectosigmoid junction, and anus: Secondary | ICD-10-CM

## 2018-05-08 DIAGNOSIS — M1000 Idiopathic gout, unspecified site: Secondary | ICD-10-CM | POA: Diagnosis not present

## 2018-05-08 DIAGNOSIS — D630 Anemia in neoplastic disease: Secondary | ICD-10-CM | POA: Diagnosis present

## 2018-05-08 DIAGNOSIS — I130 Hypertensive heart and chronic kidney disease with heart failure and stage 1 through stage 4 chronic kidney disease, or unspecified chronic kidney disease: Secondary | ICD-10-CM | POA: Diagnosis not present

## 2018-05-08 DIAGNOSIS — D631 Anemia in chronic kidney disease: Secondary | ICD-10-CM | POA: Diagnosis not present

## 2018-05-08 DIAGNOSIS — W19XXXA Unspecified fall, initial encounter: Secondary | ICD-10-CM | POA: Diagnosis present

## 2018-05-08 DIAGNOSIS — I129 Hypertensive chronic kidney disease with stage 1 through stage 4 chronic kidney disease, or unspecified chronic kidney disease: Secondary | ICD-10-CM | POA: Diagnosis not present

## 2018-05-08 DIAGNOSIS — N250 Renal osteodystrophy: Secondary | ICD-10-CM | POA: Diagnosis not present

## 2018-05-08 LAB — I-STAT TROPONIN, ED: Troponin i, poc: 0.07 ng/mL (ref 0.00–0.08)

## 2018-05-08 LAB — COMPREHENSIVE METABOLIC PANEL
ALBUMIN: 3.5 g/dL (ref 3.5–5.0)
ALT: 30 U/L (ref 0–44)
AST: 39 U/L (ref 15–41)
Alkaline Phosphatase: 108 U/L (ref 38–126)
Anion gap: 14 (ref 5–15)
BILIRUBIN TOTAL: 0.8 mg/dL (ref 0.3–1.2)
BUN: 113 mg/dL — AB (ref 8–23)
CALCIUM: 9.8 mg/dL (ref 8.9–10.3)
CO2: 23 mmol/L (ref 22–32)
Chloride: 99 mmol/L (ref 98–111)
Creatinine, Ser: 6.06 mg/dL — ABNORMAL HIGH (ref 0.61–1.24)
GFR calc Af Amer: 10 mL/min — ABNORMAL LOW (ref >60)
GFR calc non Af Amer: 8 mL/min — ABNORMAL LOW (ref >60)
GLUCOSE: 137 mg/dL — AB (ref 70–99)
Potassium: 3.6 mmol/L (ref 3.5–5.1)
Sodium: 136 mmol/L (ref 135–145)
TOTAL PROTEIN: 5.9 g/dL — AB (ref 6.5–8.1)

## 2018-05-08 LAB — CBC WITH DIFFERENTIAL/PLATELET
Abs Immature Granulocytes: 0.01 10*3/uL (ref 0.00–0.07)
BASOS PCT: 1 %
Basophils Absolute: 0 10*3/uL (ref 0.0–0.1)
EOS ABS: 0 10*3/uL (ref 0.0–0.5)
Eosinophils Relative: 1 %
HEMATOCRIT: 39.3 % (ref 39.0–52.0)
Hemoglobin: 12.4 g/dL — ABNORMAL LOW (ref 13.0–17.0)
Immature Granulocytes: 0 %
Lymphocytes Relative: 15 %
Lymphs Abs: 0.7 10*3/uL (ref 0.7–4.0)
MCH: 26.7 pg (ref 26.0–34.0)
MCHC: 31.6 g/dL (ref 30.0–36.0)
MCV: 84.5 fL (ref 80.0–100.0)
MONO ABS: 0.6 10*3/uL (ref 0.1–1.0)
MONOS PCT: 14 %
Neutro Abs: 3.1 10*3/uL (ref 1.7–7.7)
Neutrophils Relative %: 69 %
PLATELETS: 109 10*3/uL — AB (ref 150–400)
RBC: 4.65 MIL/uL (ref 4.22–5.81)
RDW: 20.9 % — AB (ref 11.5–15.5)
WBC: 4.4 10*3/uL (ref 4.0–10.5)
nRBC: 0 % (ref 0.0–0.2)

## 2018-05-08 LAB — URINALYSIS, ROUTINE W REFLEX MICROSCOPIC
BILIRUBIN URINE: NEGATIVE
Bacteria, UA: NONE SEEN
Glucose, UA: NEGATIVE mg/dL
KETONES UR: NEGATIVE mg/dL
LEUKOCYTES UA: NEGATIVE
Nitrite: NEGATIVE
PH: 5 (ref 5.0–8.0)
Protein, ur: 100 mg/dL — AB
Specific Gravity, Urine: 1.01 (ref 1.005–1.030)

## 2018-05-08 LAB — I-STAT CHEM 8, ED
BUN: 110 mg/dL — ABNORMAL HIGH (ref 8–23)
CALCIUM ION: 1.11 mmol/L — AB (ref 1.15–1.40)
CHLORIDE: 100 mmol/L (ref 98–111)
CREATININE: 6.6 mg/dL — AB (ref 0.61–1.24)
Glucose, Bld: 128 mg/dL — ABNORMAL HIGH (ref 70–99)
HEMATOCRIT: 43 % (ref 39.0–52.0)
Hemoglobin: 14.6 g/dL (ref 13.0–17.0)
Potassium: 3.6 mmol/L (ref 3.5–5.1)
Sodium: 136 mmol/L (ref 135–145)
TCO2: 27 mmol/L (ref 22–32)

## 2018-05-08 LAB — MAGNESIUM: MAGNESIUM: 1.8 mg/dL (ref 1.7–2.4)

## 2018-05-08 LAB — CBG MONITORING, ED: GLUCOSE-CAPILLARY: 130 mg/dL — AB (ref 70–99)

## 2018-05-08 MED ORDER — ALLOPURINOL 100 MG PO TABS
100.0000 mg | ORAL_TABLET | Freq: Every day | ORAL | Status: DC
  Administered 2018-05-09 – 2018-05-10 (×2): 100 mg via ORAL
  Filled 2018-05-08 (×2): qty 1

## 2018-05-08 MED ORDER — METOPROLOL TARTRATE 25 MG PO TABS
50.0000 mg | ORAL_TABLET | Freq: Every day | ORAL | Status: DC
  Administered 2018-05-09 – 2018-05-10 (×2): 50 mg via ORAL
  Filled 2018-05-08 (×2): qty 2

## 2018-05-08 MED ORDER — VITAMIN D 1000 UNITS PO TABS
5000.0000 [IU] | ORAL_TABLET | Freq: Every day | ORAL | Status: DC
  Administered 2018-05-09 – 2018-05-10 (×2): 5000 [IU] via ORAL
  Filled 2018-05-08 (×2): qty 5

## 2018-05-08 MED ORDER — LOPERAMIDE HCL 2 MG PO CAPS: 2.0000 mg | ORAL_CAPSULE | ORAL | Status: DC | PRN

## 2018-05-08 MED ORDER — SODIUM CHLORIDE 0.9 % IV BOLUS
500.0000 mL | Freq: Once | INTRAVENOUS | Status: AC
  Administered 2018-05-08: 500 mL via INTRAVENOUS

## 2018-05-08 MED ORDER — DOXAZOSIN MESYLATE 8 MG PO TABS
8.0000 mg | ORAL_TABLET | Freq: Every day | ORAL | Status: DC
  Administered 2018-05-09 – 2018-05-10 (×2): 8 mg via ORAL
  Filled 2018-05-08: qty 1
  Filled 2018-05-08: qty 4
  Filled 2018-05-08: qty 1
  Filled 2018-05-08: qty 4

## 2018-05-08 MED ORDER — ACETAMINOPHEN 325 MG PO TABS
650.0000 mg | ORAL_TABLET | Freq: Four times a day (QID) | ORAL | Status: DC | PRN
  Administered 2018-05-08 – 2018-05-09 (×3): 650 mg via ORAL
  Filled 2018-05-08 (×3): qty 2

## 2018-05-08 MED ORDER — LACTATED RINGERS IV SOLN
INTRAVENOUS | Status: DC
  Administered 2018-05-08: 14:00:00 via INTRAVENOUS

## 2018-05-08 MED ORDER — HEPARIN SODIUM (PORCINE) 5000 UNIT/ML IJ SOLN
5000.0000 [IU] | Freq: Three times a day (TID) | INTRAMUSCULAR | Status: DC
  Administered 2018-05-08 – 2018-05-10 (×6): 5000 [IU] via SUBCUTANEOUS
  Filled 2018-05-08 (×5): qty 1

## 2018-05-08 MED ORDER — ONDANSETRON HCL 4 MG PO TABS: 4.0000 mg | ORAL_TABLET | Freq: Three times a day (TID) | ORAL | Status: DC | PRN

## 2018-05-08 MED ORDER — CALCIUM POLYCARBOPHIL 625 MG PO TABS
1250.0000 mg | ORAL_TABLET | Freq: Every day | ORAL | Status: DC
  Administered 2018-05-09 – 2018-05-10 (×2): 1250 mg via ORAL
  Filled 2018-05-08 (×2): qty 2

## 2018-05-08 MED ORDER — ISOSORB DINITRATE-HYDRALAZINE 20-37.5 MG PO TABS
1.0000 | ORAL_TABLET | Freq: Every day | ORAL | Status: DC
  Administered 2018-05-08 – 2018-05-10 (×3): 1 via ORAL
  Filled 2018-05-08 (×3): qty 1

## 2018-05-08 NOTE — ED Provider Notes (Signed)
[facility] EMERGENCY DEPARTMENT Provider Note   CSN: [id number] Arrival date & time: 05/08/18  1009     History   Chief Complaint Chief Complaint  Patient presents with  . Dizziness    HPI Jason STGERMAINE Sr. is a 72 y.o. male.  The history is provided by the patient and the spouse. No language interpreter was used.  Dizziness   Jason FELCH Sr. is a 72 y.o. male who presents to the Emergency Department complaining of dizziness. He presents to the emergency department for evaluation of dizziness. He has CKD but is not on dialysis at this point time. He was feeling well when he awoke this morning. Just after getting up he went to the sink to wash up. At that time he felt hot and flushed and developed dizziness and fell to the floor. He did not lose consciousness. He was able to get back up and leave the bathroom when he had a second episode. Episodes lasted a few seconds at a time. He reports mild headache that started after the first fall. No head injury. He denies any chest pain, shortness of breath, nausea, vomiting, abdominal pain, change and urination. No prior similar symptoms. Past Medical History:  Diagnosis Date  . Anemia   . Arthritis   . Chronic renal insufficiency 07/31/2011  . COPD (chronic obstructive pulmonary disease) ([place])    pt denies  . Deficiency anemia 07/20/2012  . Diverticulosis   . Edema 09/21/2010   Qualifier: Diagnosis of  By: Jerold Coombe    . ERECTILE DYSFUNCTION, MILD 05/11/2007   Qualifier: Diagnosis of  By: Sherren Mocha MD, Jory Ee   . Exertional dyspnea 10/30/2015  . GOUT 05/11/2007   Qualifier: Diagnosis of  By: Sherren Mocha MD, Jory Ee   . Hereditary and idiopathic peripheral neuropathy 09/14/2014  . History of radiation therapy 02/21/13-03/31/13   rectum 50.4Gy total dose  . Hypersomnia 10/30/2015  . Hypertension   . Lung nodule 10/30/2015  . OAB (overactive bladder) 01/09/2016  . Obesity (BMI 30.0-34.9) 10/30/2015  . Obstructive sleep  apnea 07/29/2010   HST 11/2015 AHI 54.  On autocpap  >> 10 cm   . Peripheral edema 09/19/2013  . rectal ca dx'd 02/01/13   rectal. Radiation and chemotherapy- remains on chemotherapy-next tx. 08-22-13.   . S/P partial lobectomy of lung 11/20/2015  . S/P thoracotomy   . SUI (stress urinary incontinence), male 01/09/2016    Patient Active Problem List   Diagnosis Date Noted  . Acute on chronic renal failure ([place]) 05/08/2018  . CHF (congestive heart failure) ([place]) 05/08/2018  . Fall 05/08/2018  . Normocytic anemia 05/08/2018  . Wound of right leg 05/08/2018  . Wound of left leg 05/08/2018  . Gastroenteritis, acute 10/06/2017  . Hyperlipidemia LDL goal <130 09/23/2017  . OAB (overactive bladder) 01/09/2016  . SUI (stress urinary incontinence), male 01/09/2016  . S/P partial lobectomy of lung 11/20/2015  . Hypersomnia 10/30/2015  . Obesity (BMI 30.0-34.9) 10/30/2015  . Routine general medical examination at a health care facility 06/04/2015  . Medicare annual wellness visit, subsequent 06/04/2015  . Hereditary and idiopathic peripheral neuropathy 09/14/2014  . Rectal cancer ([place]) 02/01/2013  . Deficiency anemia 07/20/2012  . Chronic renal insufficiency 07/31/2011  . Obstructive sleep apnea 07/29/2010  . GOUT 05/11/2007  . ERECTILE DYSFUNCTION, MILD 05/11/2007  . Essential hypertension 05/11/2007    Past Surgical History:  Procedure Laterality Date  . AV FISTULA PLACEMENT Left 03/24/2016   Procedure: CREATION  OF LEFT BRACIOCEPHALIC ARTERIOVENOUS (AV) FISTULA;  Surgeon: Elam Dutch, MD;  Location: [facility];  Service: Vascular;  Laterality: Left;  . COLON SURGERY  05/20/2013  . COLONOSCOPY  2010   [facility]  . EUS N/A 02/03/2013   Procedure: LOWER ENDOSCOPIC ULTRASOUND (EUS);  Surgeon: Milus Banister, MD;  Location: [facility];  Service: Endoscopy;  Laterality: N/A;  . ILEOSTOMY CLOSURE N/A 08/24/2013   Procedure: CLOSURE OF LOOP ILEOSTOMY ;  Surgeon: Leighton Ruff, MD;  Location:  [facility];  Service: General;  Laterality: N/A;  . LAPAROSCOPIC LOW ANTERIOR RESECTION N/A 05/20/2013   Procedure: LAPAROSCOPIC LOW ANTERIOR RESECTION WITH SPLENIC FLEXURE MOBILIZATION;  Surgeon: Leighton Ruff, MD;  Location: [facility];  Service: General;  Laterality: N/A;  . OSTOMY N/A 05/20/2013   Procedure: diverting OSTOMY;  Surgeon: Leighton Ruff, MD;  Location: [facility];  Service: General;  Laterality: N/A;  . VIDEO ASSISTED THORACOSCOPY (VATS)/WEDGE RESECTION Left 11/20/2015   Procedure: VIDEO ASSISTED THORACOSCOPY (VATS)/LUNG RESECTION;  Surgeon: [name], MD;  Location: [facility];  Service: Thoracic;  Laterality: Left;  Marland Kitchen VIDEO BRONCHOSCOPY N/A 11/20/2015   Procedure: VIDEO BRONCHOSCOPY;  Surgeon: [name], MD;  Location: [facility];  Service: Thoracic;  Laterality: N/A;        Home Medications    Prior to Admission medications   Medication Sig Start Date End Date Taking? Authorizing Provider  acetaminophen (TYLENOL) 325 MG tablet Take 650 mg by mouth every 6 (six) hours as needed.   Yes [provider]  allopurinol (ZYLOPRIM) 300 MG tablet Take 1 tablet (300 mg total) by mouth daily. 12/11/17  Yes [name], MD  calcium carbonate (OSCAL) 1500 (600 Ca) MG TABS tablet Take 600 mg of elemental calcium by mouth daily.   Yes [provider]  cholecalciferol (VITAMIN D) 1000 units tablet Take 5,000 Units by mouth daily.   Yes [provider]  doxazosin (CARDURA) 8 MG tablet Take 1 tablet (8 mg total) by mouth daily. 02/05/18  Yes [name], MD  furosemide (LASIX) 80 MG tablet TAKE 1 TABLET BY MOUTH  DAILY Patient taking differently: Take 80 mg by mouth See admin instructions. Take  160 mg in the morning and 80 mg at 1400 05/12/17  Yes [name], MD  gabapentin (NEURONTIN) 300 MG capsule Take 300 mg by mouth 3 (three) times daily. Take 300mg s morning and 600mg s at bedtime 08/31/14  Yes [provider]  KLOR-CON M20 20 MEQ tablet Take 1  tablet (20 mEq total) by mouth daily. 12/11/17  Yes [name], MD  loperamide (IMODIUM) 2 MG capsule Take 2 mg by mouth as needed for diarrhea or loose stools.   Yes [provider]  metoprolol succinate (TOPROL-XL) 50 MG 24 hr tablet Take 50 mg by mouth daily. 04/15/18  Yes [provider]  polycarbophil (FIBERCON) 625 MG tablet Take 1,250 mg by mouth as needed.    Yes [provider]    Family History Family History  Problem Relation Age of Onset  . Stroke Mother   . Hypertension Mother   . Stroke Father   . Hypertension Father   . Hypertension Other   . Colon cancer Neg Hx     Social History Social History   Tobacco Use  . Smoking status: Former Smoker    Packs/day: 0.50    Years: 10.00    Pack years: 5.00    Types: Cigarettes    Last attempt to  quit: 07/07/1977    Years since quitting: 40.8  . Smokeless tobacco: Never Used  Substance Use Topics  . Alcohol use: Yes    Alcohol/week: 0.0 standard drinks    Comment: drinks occasionally  . Drug use: No     Allergies   Nsaids and Amlodipine   Review of Systems Review of Systems  Neurological: Positive for dizziness.  All other systems reviewed and are negative.    Physical Exam Updated Vital Signs BP 114/65 (BP Location: Right Arm)   Pulse 76   Temp 97.6 F (36.4 C) (Oral)   Resp 18   Ht 5\' 8"  (1.727 m)   Wt 83.2 kg   SpO2 100%   BMI 27.89 kg/m   Physical Exam  Constitutional: He is oriented to person, place, and time. He appears well-developed and well-nourished.  HENT:  Head: Normocephalic and atraumatic.  Cardiovascular:  No murmur heard. Irregular rhythm  Pulmonary/Chest: Effort normal and breath sounds normal. No respiratory distress.  Abdominal: Soft. There is no tenderness. There is no rebound and no guarding.  Musculoskeletal: He exhibits no edema or tenderness.  Fistula in the left upper arm. Healing ulcers to bilateral lower extremities  Neurological: He is  alert and oriented to person, place, and time.  No pronator drift. No facial asymmetry. Five out of five strength in all four extremities  Skin: Skin is warm and dry.  Psychiatric: He has a normal mood and affect. His behavior is normal.  Nursing note and vitals reviewed.    ED Treatments / Results  Labs (all labs ordered are listed, but only abnormal results are displayed) Labs Reviewed  COMPREHENSIVE METABOLIC PANEL - Abnormal; Notable for the following components:      Result Value   Glucose, Bld 137 (*)    BUN 113 (*)    Creatinine, Ser 6.06 (*)    Total Protein 5.9 (*)    GFR calc non Af Amer 8 (*)    GFR calc Af Amer 10 (*)    All other components within normal limits  CBC WITH DIFFERENTIAL/PLATELET - Abnormal; Notable for the following components:   Hemoglobin 12.4 (*)    RDW 20.9 (*)    Platelets 109 (*)    All other components within normal limits  URINALYSIS, ROUTINE W REFLEX MICROSCOPIC - Abnormal; Notable for the following components:   Hgb urine dipstick SMALL (*)    Protein, ur 100 (*)    All other components within normal limits  CBG MONITORING, ED - Abnormal; Notable for the following components:   Glucose-Capillary 130 (*)    All other components within normal limits  I-STAT CHEM 8, ED - Abnormal; Notable for the following components:   BUN 110 (*)    Creatinine, Ser 6.60 (*)    Glucose, Bld 128 (*)    Calcium, Ion 1.11 (*)    All other components within normal limits  MAGNESIUM  BASIC METABOLIC PANEL  CBC  I-STAT TROPONIN, ED    EKG EKG Interpretation  Date/Time:  Saturday May 08 2018 10:13:32 EDT Ventricular Rate:  95 PR Interval:    QRS Duration: 91 QT Interval:  382 QTC Calculation: 468 R Axis:   87 Text Interpretation:  Sinus rhythm Ventricular bigeminy Borderline prolonged PR interval Consider left atrial enlargement Borderline right axis deviation Borderline T abnormalities, diffuse leads Confirmed by Quintella Reichert [phone]) on  05/08/2018 10:39:57 AM   Radiology Dg Chest Port 1 View  Result Date: 05/08/2018 CLINICAL DATA:  Dizziness, slid  down a wall, near syncopal episode witnessed by family EXAM: PORTABLE CHEST 1 VIEW COMPARISON:  Portable exam 1036 hours compared to 12/19/2015 FINDINGS: Enlargement of cardiac silhouette. Mediastinal contours and pulmonary vascularity normal. Atherosclerotic calcification aorta. Postsurgical changes LEFT upper lobe. Remaining lungs clear. No definite infiltrate, pleural effusion or pneumothorax. Bones unremarkable. IMPRESSION: Enlargement of cardiac silhouette. No acute abnormalities. Electronically Signed   By: Lavonia Dana M.D.   On: 05/08/2018 11:03    Procedures Procedures (including critical care time)  Medications Ordered in ED Medications  heparin injection 5,000 Units (5,000 Units Subcutaneous Given 05/08/18 1353)  lactated ringers infusion ( Intravenous New Bag/Given 05/08/18 1357)  allopurinol (ZYLOPRIM) tablet 100 mg (has no administration in time range)  isosorbide-hydrALAZINE (BIDIL) 20-37.5 MG per tablet 1 tablet (1 tablet Oral Given 05/08/18 1353)  cholecalciferol (VITAMIN D) tablet 5,000 Units (has no administration in time range)  doxazosin (CARDURA) tablet 8 mg (has no administration in time range)  loperamide (IMODIUM) capsule 2 mg (has no administration in time range)  metoprolol tartrate (LOPRESSOR) tablet 50 mg (has no administration in time range)  polycarbophil (FIBERCON) tablet 1,250 mg (has no administration in time range)  sodium chloride 0.9 % bolus 500 mL (0 mLs Intravenous Stopped 05/08/18 1222)     Initial Impression / Assessment and Plan / ED Course  I have reviewed the triage vital signs and the nursing notes.  Pertinent labs & imaging results that were available during my care of the patient were reviewed by me and considered in my medical decision making (see chart for details).     Patient with history of cancer, CKD here for evaluation of  dizziness, ortho stasis, near syncope. He is orthostatic on evaluation. He has multiple PVCs and activity on EKG and telemetry. Labs demonstrate acute on chronic kidney disease. He was treated with IV fluid hydration. Medicine consulted for observation admission given ortho stasis, acute on chronic kidney disease and PVCs. Patient and family updated of findings of studies recommendation for admission and they are in agreement with treatment plan.  Final Clinical Impressions(s) / ED Diagnoses   Final diagnoses:  None    ED Discharge Orders    None       Quintella Reichert, MD 05/08/18 1706

## 2018-05-08 NOTE — ED Notes (Signed)
Checked patient blood sugar it was 130 notified RN of blood sugar patient is resting with family  at bedside and call bell in reach

## 2018-05-08 NOTE — H&P (Addendum)
History and Physical    Jason WEDGE Sr. UJW:[id number] DOB: 12/30/1945 DOA: 05/08/2018  PCP: [name], MD  Patient coming from: Home  Chief Complaint: weakness/fall  HPI: Jason Cotta Sr. is a 72 y.o. male with medical history significant of stage IV rectal cancer, chronic kidney disease stage IV, congestive heart failure, hypertension, neuropathy, and gout.  At 8 AM on this day of admission, the patient was in the bathroom preparing for the day.  He felt hot and dizzy (lightheaded) and slowly fell to the floor.  He denies losing consciousness or hitting his head. Pt did not injure any other part of his body. He was down for approximately 1 minute, but conscious the entire time.  Jason [name] was able to gather himself, get up and moved down to the hallway.  The exact same thing happened where he became flushed and lightheaded and he was down for another minute.  At this point, his wife saw him fall and called an ambulance.  Since that time, he has not had any of that flushed sensation, however he continues to feel lightheaded.  He specifically denies spinning.  He reports eating and drinking normally.  He takes Lasix 80 mg daily with concurrent potassium.  This was recently increased by his nephrologist.  He states it takes around 40 minutes to urinate after taking this medicine and he will urinate 3-4 times during the day.  He has a history of CKD, not on dialysis, but he does have vascular access.  There was a tentative plan to start hemodialysis on an outpatient basis.  There is otherwise no changes in medications.  He follows with the cardiology team for CHF.  Denies any chest pain or shortness of breath, no new swelling or significant weight changes.  ED Course: Patient was evaluated and also told the primary ED team that he did not hit his head or lose consciousness.  Chest x-ray and urinalysis were unremarkable.  A BMP did show an increase of creatinine from baseline around 4 to 6.6, creatinine  clearance 10.  EKG did show ventricular bigeminy which is changed from previous studies.  Mild decrease of hemoglobin to 12.4.  Review of Systems: As per HPI otherwise 10 point review of systems negative.   Past Medical History:  Diagnosis Date  . Anemia   . Arthritis   . Chronic renal insufficiency 07/31/2011  . COPD (chronic obstructive pulmonary disease) ([place])    pt denies  . Deficiency anemia 07/20/2012  . Diverticulosis   . Edema 09/21/2010   Qualifier: Diagnosis of  By: Jerold Coombe    . ERECTILE DYSFUNCTION, MILD 05/11/2007   Qualifier: Diagnosis of  By: Sherren Mocha MD, Jory Ee   . Exertional dyspnea 10/30/2015  . GOUT 05/11/2007   Qualifier: Diagnosis of  By: Sherren Mocha MD, Jory Ee   . Hereditary and idiopathic peripheral neuropathy 09/14/2014  . History of radiation therapy 02/21/13-03/31/13   rectum 50.4Gy total dose  . Hypersomnia 10/30/2015  . Hypertension   . Lung nodule 10/30/2015  . OAB (overactive bladder) 01/09/2016  . Obesity (BMI 30.0-34.9) 10/30/2015  . Obstructive sleep apnea 07/29/2010   HST 11/2015 AHI 54.  On autocpap  >> 10 cm   . Peripheral edema 09/19/2013  . rectal ca dx'd 02/01/13   rectal. Radiation and chemotherapy- remains on chemotherapy-next tx. 08-22-13.   . S/P partial lobectomy of lung 11/20/2015  . S/P thoracotomy   . SUI (stress urinary incontinence), male 01/09/2016  Past Surgical History:  Procedure Laterality Date  . AV FISTULA PLACEMENT Left 03/24/2016   Procedure: CREATION OF LEFT BRACIOCEPHALIC ARTERIOVENOUS (AV) FISTULA;  Surgeon: Elam Dutch, MD;  Location: [facility];  Service: Vascular;  Laterality: Left;  . COLON SURGERY  05/20/2013  . COLONOSCOPY  2010   [facility]  . EUS N/A 02/03/2013   Procedure: LOWER ENDOSCOPIC ULTRASOUND (EUS);  Surgeon: Milus Banister, MD;  Location: [facility];  Service: Endoscopy;  Laterality: N/A;  . ILEOSTOMY CLOSURE N/A 08/24/2013   Procedure: CLOSURE OF LOOP ILEOSTOMY ;  Surgeon: Leighton Ruff, MD;  Location: [facility];  Service: General;  Laterality: N/A;  . LAPAROSCOPIC LOW ANTERIOR RESECTION N/A 05/20/2013   Procedure: LAPAROSCOPIC LOW ANTERIOR RESECTION WITH SPLENIC FLEXURE MOBILIZATION;  Surgeon: Leighton Ruff, MD;  Location: [facility];  Service: General;  Laterality: N/A;  . OSTOMY N/A 05/20/2013   Procedure: diverting OSTOMY;  Surgeon: Leighton Ruff, MD;  Location: [facility];  Service: General;  Laterality: N/A;  . VIDEO ASSISTED THORACOSCOPY (VATS)/WEDGE RESECTION Left 11/20/2015   Procedure: VIDEO ASSISTED THORACOSCOPY (VATS)/LUNG RESECTION;  Surgeon: [name], MD;  Location: [facility];  Service: Thoracic;  Laterality: Left;  Marland Kitchen VIDEO BRONCHOSCOPY N/A 11/20/2015   Procedure: VIDEO BRONCHOSCOPY;  Surgeon: [name], MD;  Location: [facility];  Service: Thoracic;  Laterality: N/A;     reports that he quit smoking about 40 years ago. His smoking use included cigarettes. He has a 5.00 pack-year smoking history. He has never used smokeless tobacco. He reports that he drinks alcohol. He reports that he does not use drugs.  Allergies  Allergen Reactions  . Nsaids Other (See Comments)    Asked by surgeon to add this medication class as intolerance due to patients renal insufficiency.  . Amlodipine Other (See Comments)    Edema     Family History  Problem Relation Age of Onset  . Stroke Mother   . Hypertension Mother   . Stroke Father   . Hypertension Father   . Hypertension Other   . Colon cancer Neg Hx     Prior to Admission medications   Medication Sig Start Date End Date Taking? Authorizing Provider  Acetaminophen (TYLENOL 8 HOUR PO) Take by mouth daily as needed.     [provider]  allopurinol (ZYLOPRIM) 300 MG tablet Take 1 tablet (300 mg total) by mouth daily. 12/11/17   [name], MD  BIDIL 20-37.5 MG tablet Take 1 tablet by mouth daily.  09/09/17   [provider]  cholecalciferol (VITAMIN D) 1000 units tablet Take 5,000 Units by mouth daily.    [provider]  doxazosin (CARDURA) 8 MG tablet Take 1 tablet (8 mg total) by mouth daily. 02/05/18   [name], MD  furosemide (LASIX) 80 MG tablet TAKE 1 TABLET BY MOUTH  DAILY 05/12/17   [name], MD  gabapentin (NEURONTIN) 300 MG capsule Take 300 mg by mouth 3 (three) times daily. Take 300mg s morning and 600mg s at bedtime 08/31/14   [provider]  KLOR-CON M20 20 MEQ tablet Take 1 tablet (20 mEq total) by mouth daily. 12/11/17   [name], MD  loperamide (IMODIUM) 2 MG capsule Take 2 mg by mouth as needed for diarrhea or loose stools.    [provider]  metoprolol tartrate (LOPRESSOR) 50 MG tablet Take 50 mg by mouth daily.     [provider]  polycarbophil (FIBERCON) 625 MG tablet Take  1,250 mg by mouth daily.    [provider]    Physical Exam: Vitals:   05/08/18 1215 05/08/18 1230 05/08/18 1245 05/08/18 1300  BP: (!) 142/86 (!) 142/69 133/79 136/75  Pulse: 77 80 75 75  Resp: 18 (!) 22 12 15   Temp:      TempSrc:      SpO2: 99% 100% 98% 98%      Constitutional: NAD, calm, comfortable Eyes: PERRL, lids and conjunctivae normal ENMT: Mucous membranes are moist. Posterior pharynx clear of any exudate or lesions.Normal dentition.  Neck: normal, supple, no masses, no thyromegaly Respiratory: clear to auscultation bilaterally, no wheezing, no crackles. Normal respiratory effort. No accessory muscle use.  Cardiovascular: Regular rate and rhythm, no murmurs / rubs / gallops. No extremity edema. 2+ pedal pulses. No carotid bruits.  Abdomen: no tenderness, no masses palpated. No hepatosplenomegaly. Bowel sounds positive.  Musculoskeletal: no clubbing / cyanosis. No joint deformity upper and lower extremities. Good ROM, no contractures. Normal muscle tone.  Skin: Excoriated lesions on the anterior surface of bilateral lower extremities; otherwise no rashes, lesions, ulcers. No induration Neurologic: CN 2-12 grossly intact. Sensation  intact, Strength 5/5 in all 4.  Psychiatric: Normal judgment and insight. Alert and oriented x 3. Normal mood.   Labs on Admission: I have personally reviewed following labs and imaging studies  CBC: Recent Labs  Lab 05/08/18 1022 05/08/18 1041  WBC 4.4  --   NEUTROABS 3.1  --   HGB 12.4* 14.6  HCT 39.3 43.0  MCV 84.5  --   PLT 109*  --    Basic Metabolic Panel: Recent Labs  Lab 05/08/18 1022 05/08/18 1041  NA 136 136  K 3.6 3.6  CL 99 100  CO2 23  --   GLUCOSE 137* 128*  BUN 113* 110*  CREATININE 6.06* 6.60*  CALCIUM 9.8  --   MG 1.8  --    GFR: CrCl cannot be calculated (Unknown ideal weight.). Liver Function Tests: Recent Labs  Lab 05/08/18 1022  AST 39  ALT 30  ALKPHOS 108  BILITOT 0.8  PROT 5.9*  ALBUMIN 3.5   No results for input(s): LIPASE, AMYLASE in the last 168 hours. No results for input(s): AMMONIA in the last 168 hours. CBG: Recent Labs  Lab 05/08/18 1024  GLUCAP 130*  Urine analysis:    Component Value Date/Time   COLORURINE YELLOW 10/17/2016 0908   APPEARANCEUR CLEAR 10/17/2016 0908   LABSPEC 1.012 10/17/2016 0908   PHURINE 5.0 10/17/2016 0908   GLUCOSEU NEGATIVE 10/17/2016 0908   GLUCOSEU NEGATIVE 10/14/2016 1144   HGBUR NEGATIVE 10/17/2016 0908   HGBUR negative 05/29/2010 0952   BILIRUBINUR NEGATIVE 10/17/2016 0908   BILIRUBINUR Negative 10/14/2016 1220   KETONESUR NEGATIVE 10/17/2016 0908   PROTEINUR 30 (A) 10/17/2016 0908   UROBILINOGEN 0.2 10/14/2016 1220   UROBILINOGEN 0.2 10/14/2016 1144   NITRITE NEGATIVE 10/17/2016 0908   LEUKOCYTESUR NEGATIVE 10/17/2016 0908    Radiological Exams on Admission: Dg Chest Port 1 View  Result Date: 05/08/2018 CLINICAL DATA:  Dizziness, slid down a wall, near syncopal episode witnessed by family EXAM: PORTABLE CHEST 1 VIEW COMPARISON:  Portable exam 1036 hours compared to 12/19/2015 FINDINGS: Enlargement of cardiac silhouette. Mediastinal contours and pulmonary vascularity normal.  Atherosclerotic calcification aorta. Postsurgical changes LEFT upper lobe. Remaining lungs clear. No definite infiltrate, pleural effusion or pneumothorax. Bones unremarkable. IMPRESSION: Enlargement of cardiac silhouette. No acute abnormalities. Electronically Signed   By: Lavonia Dana M.D.   On:  05/08/2018 11:03    EKG: Independently reviewed.  Assessment/Plan Principal Problem:   Acute on chronic renal failure (HCC)  Gentle hydration, 75 mL/hr LR  Monitor I/os  Hold Lasix and potassium  Hold Gabapentin  Monitor BMP  We will hold off on nephrology consult pending response to fluids  Sodium restricted diet Active Problems:   Fall  Sound like a vasovagal event rather than vertigo or cerebellar stroke  IVF's 75 mL/hr LR  Fall precautions  PT   GOUT  We will renally dose allopurinol to 100 mg daily   Essential hypertension  Continue Cardura 8 mg daily   Obstructive sleep apnea  CPAP, 10 cmH2O   CHF (congestive heart failure) (HCC)  Metoprolol succinate 50 mg daily  Bidil 20-37.5 mg daily  Hold diuretic   Rectal cancer (HCC)  Continue fiber and Immodium   Normocytic anemia  Monitor BMP  Likely 2/2 CKD   Wound of L and R LE's  Monitor  DVT prophylaxis: SQ HPN Code Status: Full  Family Communication: wife and patient Disposition Plan: Hopeful discharge home after 2 nights Consults called: Discussed case with nephro, Dr Augustin Coupe, will gently hydrate and consult if no improvement  Admission status: Inpatient   Severity of Illness: The appropriate patient status for this patient is INPATIENT. Inpatient status is judged to be reasonable and necessary in order to provide the required intensity of service to ensure the patient's safety. The patient's presenting symptoms, physical exam findings, and initial radiographic and laboratory data in the context of their chronic comorbidities is felt to place them at high risk for further clinical deterioration. Furthermore, it is not  anticipated that the patient will be medically stable for discharge from the hospital within 2 midnights of admission. The following factors support the patient status of inpatient.   " The patient's presenting symptoms include weakness, acute on chronic renal failure. " The worrisome physical exam findings include: no worrisome physical findings. " The initial radiographic and laboratory data are worrisome because of acute on chronic renal failure. " The chronic co-morbidities include CKD, CHF, .   * I certify that at the point of admission it is my clinical judgment that the patient will require inpatient hospital care spanning beyond 2 midnights from the point of admission due to high intensity of service, high risk for further deterioration and high frequency of surveillance required.*   Shelda Pal, DO [facility] [email or web address] 05/08/2018, 1:33 PM

## 2018-05-08 NOTE — ED Notes (Signed)
Got patient undress on the monitor did ekg shown to Dr Ayesha Rumpf patient is resting with family at bedside and call bell in reach

## 2018-05-08 NOTE — ED Triage Notes (Signed)
Pt arrived via [facility]; pt from [facility] with c/o dizziness; pt slid down the wall, witnessed by family, pt had no head trauma per EMS; 154/90, 16, 98% on RA; CBG123; 52; pt goes in and out of bigeminy, pt does not have paceer or defib; pt has shunt in [place].

## 2018-05-09 DIAGNOSIS — S81802S Unspecified open wound, left lower leg, sequela: Secondary | ICD-10-CM

## 2018-05-09 DIAGNOSIS — I509 Heart failure, unspecified: Secondary | ICD-10-CM

## 2018-05-09 DIAGNOSIS — I10 Essential (primary) hypertension: Secondary | ICD-10-CM

## 2018-05-09 DIAGNOSIS — D649 Anemia, unspecified: Secondary | ICD-10-CM

## 2018-05-09 DIAGNOSIS — M1000 Idiopathic gout, unspecified site: Secondary | ICD-10-CM

## 2018-05-09 DIAGNOSIS — W19XXXS Unspecified fall, sequela: Secondary | ICD-10-CM

## 2018-05-09 DIAGNOSIS — C20 Malignant neoplasm of rectum: Secondary | ICD-10-CM

## 2018-05-09 DIAGNOSIS — G4733 Obstructive sleep apnea (adult) (pediatric): Secondary | ICD-10-CM

## 2018-05-09 LAB — BASIC METABOLIC PANEL
ANION GAP: 11 (ref 5–15)
BUN: 112 mg/dL — ABNORMAL HIGH (ref 8–23)
CO2: 25 mmol/L (ref 22–32)
Calcium: 10 mg/dL (ref 8.9–10.3)
Chloride: 99 mmol/L (ref 98–111)
Creatinine, Ser: 6.29 mg/dL — ABNORMAL HIGH (ref 0.61–1.24)
GFR calc non Af Amer: 8 mL/min — ABNORMAL LOW (ref >60)
GFR, EST AFRICAN AMERICAN: 9 mL/min — AB (ref >60)
Glucose, Bld: 90 mg/dL (ref 70–99)
Potassium: 3.2 mmol/L — ABNORMAL LOW (ref 3.5–5.1)
SODIUM: 135 mmol/L (ref 135–145)

## 2018-05-09 LAB — CBC
HEMATOCRIT: 36.1 % — AB (ref 39.0–52.0)
HEMOGLOBIN: 11.7 g/dL — AB (ref 13.0–17.0)
MCH: 26.7 pg (ref 26.0–34.0)
MCHC: 32.4 g/dL (ref 30.0–36.0)
MCV: 82.2 fL (ref 80.0–100.0)
NRBC: 0 % (ref 0.0–0.2)
Platelets: 117 10*3/uL — ABNORMAL LOW (ref 150–400)
RBC: 4.39 MIL/uL (ref 4.22–5.81)
RDW: 20.8 % — AB (ref 11.5–15.5)
WBC: 5.3 10*3/uL (ref 4.0–10.5)

## 2018-05-09 NOTE — Progress Notes (Signed)
Pt placed on nasal AUTO CPAP tolerating well at this time.  RT will continue to monitor.

## 2018-05-09 NOTE — Consult Note (Signed)
Reason for Consult: CKD5 Referring Physician:  Dr. Ree Kida  Chief Complaint: Fall  Assessment/Plan: 1. CKD5 - He is followed by Dr. Posey Pronto and already has a left BCF that is ready to be cannulated. And certainly nearing dialysis but no overt uremic signs. No indication to dialyze at this time and will follow pt closely as an outpatient and initiate HD when he develops uremic signs. I have educated the pt on what early signs to look for.  - From renal standpoint he can be followed with Dr. Posey Pronto in 2 weeks; I will set that up on Monday. 2. Fall - he was in the bathroom when the fall occurred and fortunate that there was not major trauma. He feels a lot better since being in the hospital. 3. CHF - may have been volume down at time of presentation as he felt markedly improved with holding the diuretics and being given fluids at a relatively slow rate. But with h/o CHF I would not give additional fluids.  4. Hypertension - controlled. 5. Anemia 6. Renal ostedystrophy 7. Remove h/o rectal cancer   HPI: Jason SWIGERT Sr. is an 72 y.o. male HTN OSA COPD CKD5 followed by Dr. Posey Pronto with [facility] and has already had  Fistula placed in preparation for dialysis in the future. He's presenting after feeling lightheaded in the bathroom and fell to the floor; he does not endorse the room spinning and does not believe that he lost consciousness or head trauma. He recently had his Lasix increased and denies an obstructive symptoms, worsened leg swelling, dyspnea, CP; he also denies f/c/n/v and states that he has a very good appetite. He denies dysgeusia, NSAID use, hematuria, dysuria or flank pain.    ROS Pertinent items are noted in HPI.  Chemistry and CBC: Creatinine  Date/Time Value Ref Range Status  08/10/2017 3.7 (A) 0.6 - 1.3 Final  02/24/2017 3.2 (A) 0.6 - 1.3 Final  11/06/2016 4.2 (A) 0.6 - 1.3 mg/dL Final  05/30/2016 4.3 (A) 0.6 - 1.3 mg/dL Final  03/31/2016 4.6 (A) 0.6 - 1.3 mg/dL Final  02/06/2016 4.1  (A) 0.6 - 1.3 mg/dL Final  11/05/2015 3.9 (A) 0.6 - 1.3 mg/dL Final  02/20/2014 02:32 PM 2.4 (H) 0.7 - 1.3 mg/dL Final  12/19/2013 08:41 AM 1.8 (H) 0.7 - 1.3 mg/dL Final  11/29/2013 09:53 AM 1.7 (H) 0.7 - 1.3 mg/dL Final  10/24/2013 09:06 AM 1.6 (H) 0.7 - 1.3 mg/dL Final  10/12/2013 12:40 PM 1.8 (H) 0.7 - 1.3 mg/dL Final  10/05/2013 09:28 AM 1.5 (H) 0.7 - 1.3 mg/dL Final  09/22/2013 09:38 AM 1.7 (H) 0.7 - 1.3 mg/dL Final  09/08/2013 09:33 AM 1.6 (H) 0.7 - 1.3 mg/dL Final  08/15/2013 10:37 AM 3.7 (HH) 0.7 - 1.3 mg/dL Final  08/08/2013 08:24 AM 3.5 (HH) 0.7 - 1.3 mg/dL Final  08/01/2013 08:14 AM 2.9 (H) 0.7 - 1.3 mg/dL Final  07/28/2013 11:14 AM 3.0 (HH) 0.7 - 1.3 mg/dL Final  07/21/2013 04:18 PM 3.0 (HH) 0.7 - 1.3 mg/dL Final  04/28/2013 08:59 AM 1.8 (H) 0.7 - 1.3 mg/dL Final  04/25/2013 09:10 AM 1.9 (H) 0.7 - 1.3 mg/dL Final  04/01/2013 08:56 AM 1.9 (H) 0.7 - 1.3 mg/dL Final  03/17/2013 08:32 AM 1.9 (H) 0.7 - 1.3 mg/dL Final  03/10/2013 09:44 AM 1.8 (H) 0.7 - 1.3 mg/dL Final  12/09/2012 10:48 AM 2.0 (H) 0.7 - 1.3 mg/dL Final   Creat  Date/Time Value Ref Range Status  09/07/2013 02:12 PM 1.75 (H)  0.50 - 1.35 mg/dL Final   Creatinine, Ser  Date/Time Value Ref Range Status  05/09/2018 05:36 AM 6.29 (H) 0.61 - 1.24 mg/dL Final  05/08/2018 10:41 AM 6.60 (H) 0.61 - 1.24 mg/dL Final  05/08/2018 10:22 AM 6.06 (H) 0.61 - 1.24 mg/dL Final  09/23/2017 04:04 PM 4.12 (H) 0.40 - 1.50 mg/dL Final  10/17/2016 09:02 AM 4.38 (H) 0.61 - 1.24 mg/dL Final  10/14/2016 11:44 AM 4.51 (HH) 0.40 - 1.50 mg/dL Final  01/09/2016 04:49 PM 3.04 (H) 0.40 - 1.50 mg/dL Final  11/23/2015 05:13 AM 3.11 (H) 0.61 - 1.24 mg/dL Final  11/22/2015 04:15 AM 2.81 (H) 0.61 - 1.24 mg/dL Final  11/21/2015 04:12 AM 2.90 (H) 0.61 - 1.24 mg/dL Final  11/16/2015 03:51 PM 3.48 (H) 0.61 - 1.24 mg/dL Final  08/22/2015 03:54 PM 3.25 (H) 0.40 - 1.50 mg/dL Final  06/04/2015 10:15 AM 2.98 (H) 0.40 - 1.50 mg/dL Final  09/05/2014  04:33 PM 2.49 (H) 0.40 - 1.50 mg/dL Final  08/17/2014 03:55 PM 3.87 (H) 0.40 - 1.50 mg/dL Final  08/02/2014 04:08 PM 2.77 (H) 0.40 - 1.50 mg/dL Final  09/24/2013 09:51 AM 1.51 (H) 0.50 - 1.35 mg/dL Final  09/19/2013 01:34 PM 1.3 0.4 - 1.5 mg/dL Final  08/27/2013 04:22 AM 1.80 (H) 0.50 - 1.35 mg/dL Final  08/26/2013 04:25 AM 1.65 (H) 0.50 - 1.35 mg/dL Final  08/25/2013 04:23 AM 1.78 (H) 0.50 - 1.35 mg/dL Final  08/24/2013 12:37 PM 2.02 (H) 0.50 - 1.35 mg/dL Final  06/28/2013 05:00 AM 2.88 (H) 0.50 - 1.35 mg/dL Final  06/27/2013 06:05 AM 2.99 (H) 0.50 - 1.35 mg/dL Final  06/26/2013 04:53 AM 3.16 (H) 0.50 - 1.35 mg/dL Final  06/25/2013 05:58 AM 3.58 (H) 0.50 - 1.35 mg/dL Final  06/24/2013 05:51 AM 4.66 (H) 0.50 - 1.35 mg/dL Final  06/23/2013 06:06 AM 6.60 (H) 0.50 - 1.35 mg/dL Final  06/22/2013 05:45 AM 8.28 (H) 0.50 - 1.35 mg/dL Final  06/21/2013 06:15 AM 9.11 (H) 0.50 - 1.35 mg/dL Final  06/20/2013 04:45 AM 9.82 (H) 0.50 - 1.35 mg/dL Final  06/19/2013 05:00 AM 9.74 (H) 0.50 - 1.35 mg/dL Final  06/18/2013 03:40 AM 9.21 (H) 0.50 - 1.35 mg/dL Final  06/17/2013 03:45 AM 9.00 (H) 0.50 - 1.35 mg/dL Final  06/16/2013 03:30 AM 8.40 (H) 0.50 - 1.35 mg/dL Final  06/16/2013 03:30 AM 8.40 (H) 0.50 - 1.35 mg/dL Final  06/15/2013 06:12 AM 7.30 (H) 0.50 - 1.35 mg/dL Final  06/14/2013 06:25 AM 5.68 (H) 0.50 - 1.35 mg/dL Final  06/13/2013 04:10 AM 4.46 (H) 0.50 - 1.35 mg/dL Final  06/13/2013 04:10 AM 4.48 (H) 0.50 - 1.35 mg/dL Final  06/12/2013 04:22 AM 3.48 (H) 0.50 - 1.35 mg/dL Final  06/11/2013 06:15 PM 3.27 (H) 0.50 - 1.35 mg/dL Final  06/11/2013 05:50 AM 3.11 (H) 0.50 - 1.35 mg/dL Final  06/10/2013 05:52 AM 2.89 (H) 0.50 - 1.35 mg/dL Final  06/09/2013 04:34 AM 2.71 (H) 0.50 - 1.35 mg/dL Final  06/09/2013 04:34 AM 2.70 (H) 0.50 - 1.35 mg/dL Final  06/08/2013 09:25 AM 2.75 (H) 0.50 - 1.35 mg/dL Final  06/07/2013 09:18 AM 2.72 (H) 0.50 - 1.35 mg/dL Final  06/06/2013 04:25 AM 3.18 (H) 0.50 - 1.35  mg/dL Final  06/05/2013 05:15 AM 3.15 (H) 0.50 - 1.35 mg/dL Final  06/04/2013 04:50 AM 3.14 (H) 0.50 - 1.35 mg/dL Final  06/03/2013 06:25 AM 3.28 (H) 0.50 - 1.35 mg/dL Final   Recent Labs  Lab 05/08/18 1022 05/08/18 1041 05/09/18 0536  NA 136 136 135  K 3.6 3.6 3.2*  CL 99 100 99  CO2 23  --  25  GLUCOSE 137* 128* 90  BUN 113* 110* 112*  CREATININE 6.06* 6.60* 6.29*  CALCIUM 9.8  --  10.0   Recent Labs  Lab 05/08/18 1022 05/08/18 1041 05/09/18 0536  WBC 4.4  --  5.3  NEUTROABS 3.1  --   --   HGB 12.4* 14.6 11.7*  HCT 39.3 43.0 36.1*  MCV 84.5  --  82.2  PLT 109*  --  117*   Liver Function Tests: Recent Labs  Lab 05/08/18 1022  AST 39  ALT 30  ALKPHOS 108  BILITOT 0.8  PROT 5.9*  ALBUMIN 3.5   No results for input(s): LIPASE, AMYLASE in the last 168 hours. No results for input(s): AMMONIA in the last 168 hours. Cardiac Enzymes: No results for input(s): CKTOTAL, CKMB, CKMBINDEX, TROPONINI in the last 168 hours. Iron Studies: No results for input(s): IRON, TIBC, TRANSFERRIN, FERRITIN in the last 72 hours. PT/INR: '@LABRCNTIP'$ (inr:5)  Xrays/Other Studies: ) Results for orders placed or performed during the hospital encounter of 05/08/18 (from the past 48 hour(s))  Comprehensive metabolic panel     Status: Abnormal   Collection Time: 05/08/18 10:22 AM  Result Value Ref Range   Sodium 136 135 - 145 mmol/L   Potassium 3.6 3.5 - 5.1 mmol/L    Comment: SLIGHT HEMOLYSIS   Chloride 99 98 - 111 mmol/L   CO2 23 22 - 32 mmol/L   Glucose, Bld 137 (H) 70 - 99 mg/dL   BUN 113 (H) 8 - 23 mg/dL   Creatinine, Ser 6.06 (H) 0.61 - 1.24 mg/dL   Calcium 9.8 8.9 - 10.3 mg/dL   Total Protein 5.9 (L) 6.5 - 8.1 g/dL   Albumin 3.5 3.5 - 5.0 g/dL   AST 39 15 - 41 U/L   ALT 30 0 - 44 U/L   Alkaline Phosphatase 108 38 - 126 U/L   Total Bilirubin 0.8 0.3 - 1.2 mg/dL   GFR calc non Af Amer 8 (L) >60 mL/min   GFR calc Af Amer 10 (L) >60 mL/min    Comment: (NOTE) The eGFR has been  calculated using the CKD EPI equation. This calculation has not been validated in all clinical situations. eGFR's persistently <60 mL/min signify possible Chronic Kidney Disease.    Anion gap 14 5 - 15    Comment: Performed at [facility] [address], [place], [place] [address]  CBC with Differential     Status: Abnormal   Collection Time: 05/08/18 10:22 AM  Result Value Ref Range   WBC 4.4 4.0 - 10.5 K/uL   RBC 4.65 4.22 - 5.81 MIL/uL   Hemoglobin 12.4 (L) 13.0 - 17.0 g/dL   HCT 39.3 39.0 - 52.0 %   MCV 84.5 80.0 - 100.0 fL   MCH 26.7 26.0 - 34.0 pg   MCHC 31.6 30.0 - 36.0 g/dL   RDW 20.9 (H) 11.5 - 15.5 %   Platelets 109 (L) 150 - 400 K/uL    Comment: REPEATED TO VERIFY PLATELET COUNT CONFIRMED BY SMEAR SPECIMEN CHECKED FOR CLOTS    nRBC 0.0 0.0 - 0.2 %   Neutrophils Relative % 69 %   Neutro Abs 3.1 1.7 - 7.7 K/uL   Lymphocytes Relative 15 %   Lymphs Abs 0.7 0.7 - 4.0 K/uL   Monocytes Relative 14 %   Monocytes Absolute 0.6 0.1 - 1.0 K/uL  Eosinophils Relative 1 %   Eosinophils Absolute 0.0 0.0 - 0.5 K/uL   Basophils Relative 1 %   Basophils Absolute 0.0 0.0 - 0.1 K/uL   Immature Granulocytes 0 %   Abs Immature Granulocytes 0.01 0.00 - 0.07 K/uL    Comment: Performed at [facility] [address], [place], [place] [address]  Magnesium     Status: None   Collection Time: 05/08/18 10:22 AM  Result Value Ref Range   Magnesium 1.8 1.7 - 2.4 mg/dL    Comment: Performed at [facility] [address] [place], [place] [address]  CBG monitoring, ED     Status: Abnormal   Collection Time: 05/08/18 10:24 AM  Result Value Ref Range   Glucose-Capillary 130 (H) 70 - 99 mg/dL  I-stat troponin, ED     Status: None   Collection Time: 05/08/18 10:39 AM  Result Value Ref Range   Troponin i, poc 0.07 0.00 - 0.08 ng/mL   Comment 3            Comment: Due to the release kinetics of cTnI, a negative result within the first hours of the onset of symptoms  does not rule out myocardial infarction with certainty. If myocardial infarction is still suspected, repeat the test at appropriate intervals.   I-stat Chem 8, ED     Status: Abnormal   Collection Time: 05/08/18 10:41 AM  Result Value Ref Range   Sodium 136 135 - 145 mmol/L   Potassium 3.6 3.5 - 5.1 mmol/L   Chloride 100 98 - 111 mmol/L   BUN 110 (H) 8 - 23 mg/dL   Creatinine, Ser 6.60 (H) 0.61 - 1.24 mg/dL   Glucose, Bld 128 (H) 70 - 99 mg/dL   Calcium, Ion 1.11 (L) 1.15 - 1.40 mmol/L   TCO2 27 22 - 32 mmol/L   Hemoglobin 14.6 13.0 - 17.0 g/dL   HCT 43.0 39.0 - 52.0 %  Urinalysis, Routine w reflex microscopic     Status: Abnormal   Collection Time: 05/08/18  1:14 PM  Result Value Ref Range   Color, Urine YELLOW YELLOW   APPearance CLEAR CLEAR   Specific Gravity, Urine 1.010 1.005 - 1.030   pH 5.0 5.0 - 8.0   Glucose, UA NEGATIVE NEGATIVE mg/dL   Hgb urine dipstick SMALL (A) NEGATIVE   Bilirubin Urine NEGATIVE NEGATIVE   Ketones, ur NEGATIVE NEGATIVE mg/dL   Protein, ur 100 (A) NEGATIVE mg/dL   Nitrite NEGATIVE NEGATIVE   Leukocytes, UA NEGATIVE NEGATIVE   RBC / HPF 0-5 0 - 5 RBC/hpf   WBC, UA 0-5 0 - 5 WBC/hpf   Bacteria, UA NONE SEEN NONE SEEN   Hyaline Casts, UA PRESENT     Comment: Performed at [facility], [address] [place], [place] [address]  Basic metabolic panel     Status: Abnormal   Collection Time: 05/09/18  5:36 AM  Result Value Ref Range   Sodium 135 135 - 145 mmol/L   Potassium 3.2 (L) 3.5 - 5.1 mmol/L   Chloride 99 98 - 111 mmol/L   CO2 25 22 - 32 mmol/L   Glucose, Bld 90 70 - 99 mg/dL   BUN 112 (H) 8 - 23 mg/dL   Creatinine, Ser 6.29 (H) 0.61 - 1.24 mg/dL   Calcium 10.0 8.9 - 10.3 mg/dL   GFR calc non Af Amer 8 (L) >60 mL/min   GFR calc Af Amer 9 (L) >60 mL/min  Comment: (NOTE) The eGFR has been calculated using the CKD EPI equation. This calculation has not been validated in all clinical situations. eGFR's persistently <60 mL/min  signify possible Chronic Kidney Disease.    Anion gap 11 5 - 15    Comment: Performed at [facility] [address] [place], [place] [address]  CBC     Status: Abnormal   Collection Time: 05/09/18  5:36 AM  Result Value Ref Range   WBC 5.3 4.0 - 10.5 K/uL   RBC 4.39 4.22 - 5.81 MIL/uL   Hemoglobin 11.7 (L) 13.0 - 17.0 g/dL   HCT 36.1 (L) 39.0 - 52.0 %   MCV 82.2 80.0 - 100.0 fL   MCH 26.7 26.0 - 34.0 pg   MCHC 32.4 30.0 - 36.0 g/dL   RDW 20.8 (H) 11.5 - 15.5 %   Platelets 117 (L) 150 - 400 K/uL    Comment: REPEATED TO VERIFY Immature Platelet Fraction may be clinically indicated, consider ordering this additional test [id number] CONSISTENT WITH PREVIOUS RESULT    nRBC 0.0 0.0 - 0.2 %    Comment: Performed at [facility], [place] [address], [place], [place] [address]   Dg Chest Port 1 View  Result Date: 05/08/2018 CLINICAL DATA:  Dizziness, slid down a wall, near syncopal episode witnessed by family EXAM: PORTABLE CHEST 1 VIEW COMPARISON:  Portable exam 1036 hours compared to 12/19/2015 FINDINGS: Enlargement of cardiac silhouette. Mediastinal contours and pulmonary vascularity normal. Atherosclerotic calcification aorta. Postsurgical changes LEFT upper lobe. Remaining lungs clear. No definite infiltrate, pleural effusion or pneumothorax. Bones unremarkable. IMPRESSION: Enlargement of cardiac silhouette. No acute abnormalities. Electronically Signed   By: Lavonia Dana M.D.   On: 05/08/2018 11:03    PMH:   Past Medical History:  Diagnosis Date  . Anemia   . Arthritis   . Chronic renal insufficiency 07/31/2011  . COPD (chronic obstructive pulmonary disease) ([facility])    pt denies  . Deficiency anemia 07/20/2012  . Diverticulosis   . Edema 09/21/2010   Qualifier: Diagnosis of  By: Jerold Coombe    . ERECTILE DYSFUNCTION, MILD 05/11/2007   Qualifier: Diagnosis of  By: Sherren Mocha MD, Jory Ee   . Exertional dyspnea 10/30/2015  . GOUT 05/11/2007   Qualifier: Diagnosis of  By:  Sherren Mocha MD, Jory Ee   . Hereditary and idiopathic peripheral neuropathy 09/14/2014  . History of radiation therapy 02/21/13-03/31/13   rectum 50.4Gy total dose  . Hypersomnia 10/30/2015  . Hypertension   . Lung nodule 10/30/2015  . OAB (overactive bladder) 01/09/2016  . Obesity (BMI 30.0-34.9) 10/30/2015  . Obstructive sleep apnea 07/29/2010   HST 11/2015 AHI 54.  On autocpap  >> 10 cm   . Peripheral edema 09/19/2013  . rectal ca dx'd 02/01/13   rectal. Radiation and chemotherapy- remains on chemotherapy-next tx. 08-22-13.   . S/P partial lobectomy of lung 11/20/2015  . S/P thoracotomy   . SUI (stress urinary incontinence), male 01/09/2016    PSH:   Past Surgical History:  Procedure Laterality Date  . AV FISTULA PLACEMENT Left 03/24/2016   Procedure: CREATION OF LEFT BRACIOCEPHALIC ARTERIOVENOUS (AV) FISTULA;  Surgeon: Elam Dutch, MD;  Location: [facility];  Service: Vascular;  Laterality: Left;  . COLON SURGERY  05/20/2013  . COLONOSCOPY  2010   [facility]  . EUS N/A 02/03/2013   Procedure: LOWER ENDOSCOPIC ULTRASOUND (EUS);  Surgeon: Milus Banister, MD;  Location: [facility];  Service: Endoscopy;  Laterality: N/A;  .  ILEOSTOMY CLOSURE N/A 08/24/2013   Procedure: CLOSURE OF LOOP ILEOSTOMY ;  Surgeon: Leighton Ruff, MD;  Location: [facility];  Service: General;  Laterality: N/A;  . LAPAROSCOPIC LOW ANTERIOR RESECTION N/A 05/20/2013   Procedure: LAPAROSCOPIC LOW ANTERIOR RESECTION WITH SPLENIC FLEXURE MOBILIZATION;  Surgeon: Leighton Ruff, MD;  Location: [facility];  Service: General;  Laterality: N/A;  . OSTOMY N/A 05/20/2013   Procedure: diverting OSTOMY;  Surgeon: Leighton Ruff, MD;  Location: [facility];  Service: General;  Laterality: N/A;  . VIDEO ASSISTED THORACOSCOPY (VATS)/WEDGE RESECTION Left 11/20/2015   Procedure: VIDEO ASSISTED THORACOSCOPY (VATS)/LUNG RESECTION;  Surgeon: [name], MD;  Location: [facility];  Service: Thoracic;  Laterality: Left;  Marland Kitchen VIDEO BRONCHOSCOPY N/A 11/20/2015    Procedure: VIDEO BRONCHOSCOPY;  Surgeon: [name], MD;  Location[facility];  Service: Thoracic;  Laterality: N/A;    Allergies:  Allergies  Allergen Reactions  . Nsaids Other (See Comments)    Asked by surgeon to add this medication class as intolerance due to patients renal insufficiency.  . Amlodipine Other (See Comments)    Edema     Medications:   Prior to Admission medications   Medication Sig Start Date End Date Taking? Authorizing Provider  acetaminophen (TYLENOL) 325 MG tablet Take 650 mg by mouth every 6 (six) hours as needed.   Yes [provider]  allopurinol (ZYLOPRIM) 300 MG tablet Take 1 tablet (300 mg total) by mouth daily. 12/11/17  Yes [name], MD  calcium carbonate (OSCAL) 1500 (600 Ca) MG TABS tablet Take 600 mg of elemental calcium by mouth daily.   Yes [provider]  cholecalciferol (VITAMIN D) 1000 units tablet Take 5,000 Units by mouth daily.   Yes [provider]  doxazosin (CARDURA) 8 MG tablet Take 1 tablet (8 mg total) by mouth daily. 02/05/18  Yes [name], MD  furosemide (LASIX) 80 MG tablet TAKE 1 TABLET BY MOUTH  DAILY Patient taking differently: Take 80 mg by mouth See admin instructions. Take  160 mg in the morning and 80 mg at 1400 05/12/17  Yes [name], MD  gabapentin (NEURONTIN) 300 MG capsule Take 300 mg by mouth 3 (three) times daily. Take '300mg'$ s morning and '600mg'$ s at bedtime 08/31/14  Yes [provider]  KLOR-CON M20 20 MEQ tablet Take 1 tablet (20 mEq total) by mouth daily. 12/11/17  Yes [name], MD  loperamide (IMODIUM) 2 MG capsule Take 2 mg by mouth as needed for diarrhea or loose stools.   Yes [provider]  metoprolol succinate (TOPROL-XL) 50 MG 24 hr tablet Take 50 mg by mouth daily. 04/15/18  Yes [provider]  polycarbophil (FIBERCON) 625 MG tablet Take 1,250 mg by mouth as needed.    Yes [provider]    Discontinued Meds:   Medications  Discontinued During This Encounter  Medication Reason  . Acetaminophen (TYLENOL 8 HOUR PO) Patient Preference  . metoprolol tartrate (LOPRESSOR) 50 MG tablet Patient Preference  . BIDIL 20-37.5 MG tablet Discontinued by provider  . lactated ringers infusion     Social History:  reports that he quit smoking about 40 years ago. His smoking use included cigarettes. He has a 5.00 pack-year smoking history. He has never used smokeless tobacco. He reports that he drinks alcohol. He reports that he does not use drugs.  Family History:   Family History  Problem Relation Age of Onset  . Stroke Mother   . Hypertension Mother   .  Stroke Father   . Hypertension Father   . Hypertension Other   . Colon cancer Neg Hx     Blood pressure 122/61, pulse 76, temperature 97.7 F (36.5 C), temperature source Oral, resp. rate 18, height '5\' 8"'$  (1.727 m), weight 83.2 kg, SpO2 99 %. General appearance: alert, cooperative and appears stated age Head: Normocephalic, without obvious abnormality, atraumatic Eyes: negative Neck: no adenopathy, no carotid bruit, no JVD, supple, symmetrical, trachea midline and thyroid not enlarged, symmetric, no tenderness/mass/nodules Back: symmetric, no curvature. ROM normal. No CVA tenderness. Resp: clear to auscultation bilaterally Chest wall: no tenderness Cardio: regular rate and rhythm, S1, S2 normal, no murmur, click, rub or gallop GI: soft, non-tender; bowel sounds normal; no masses,  no organomegaly Extremities: edema none Pulses: 2+ and symmetric Skin: Skin color, texture, turgor normal. No rashes or lesions Lymph nodes: Cervical, supraclavicular, and axillary nodes normal. Neurologic: Grossly normal       Daelon Dunivan, Hunt Oris, MD 05/09/2018, 12:54 PM

## 2018-05-09 NOTE — Evaluation (Signed)
Physical Therapy Evaluation Patient Details Name: Jason [name] Sr. MRN: [id number] DOB: [birth year] Today's Date: 05/09/2018   History of Present Illness  72 y.o. male with medical history significant of stage IV rectal cancer, chronic kidney disease stage IV, congestive heart failure, hypertension, neuropathy, and gout  Clinical Impression  Orders received for PT evaluation. Patient demonstrates modest deficits in functional mobility but overall is steady with VSS throughout activity. No focal needs for acute PT services at this time, will sign off.    Follow Up Recommendations No PT follow up;Supervision - Intermittent    Equipment Recommendations  None recommended by PT    Recommendations for Other Services       Precautions / Restrictions        Mobility  Bed Mobility Overal bed mobility: Modified Independent                Transfers Overall transfer level: Independent Equipment used: None                Ambulation/Gait Ambulation/Gait assistance: Independent Gait Distance (Feet): 260 Feet Assistive device: None Gait Pattern/deviations: Antalgic Gait velocity: decreased Gait velocity interpretation: <1.8 ft/sec, indicate of risk for recurrent falls General Gait Details: antalgic but steady with ambulation  Stairs            Wheelchair Mobility    Modified Rankin (Stroke Patients Only)       Balance Overall balance assessment: Mild deficits observed, not formally tested(Head turns and speed changes without LOB)                                           Pertinent Vitals/Pain Pain Assessment: Faces Faces Pain Scale: Hurts even more Pain Location: pain in right ankle, states arthritic in nature Pain Descriptors / Indicators: Sore Pain Intervention(s): Monitored during session    Home Living Family/patient expects to be discharged to:: Private residence Living Arrangements: Spouse/significant other Available Help at  Discharge: Family Type of Home: House Home Access: Stairs to enter Entrance Stairs-Rails: None Technical brewer of Steps: 1 Home Layout: One level Home Equipment: None      Prior Function Level of Independence: Independent               Hand Dominance   Dominant Hand: Right    Extremity/Trunk Assessment   Upper Extremity Assessment Upper Extremity Assessment: Overall WFL for tasks assessed    Lower Extremity Assessment Lower Extremity Assessment: RLE deficits/detail(bilateral sensation deficits) RLE Deficits / Details: limited ROM dur to arthritic pain       Communication   Communication: No difficulties  Cognition Arousal/Alertness: Awake/alert Behavior During Therapy: WFL for tasks assessed/performed Overall Cognitive Status: Within Functional Limits for tasks assessed                                        General Comments      Exercises     Assessment/Plan    PT Assessment Patent does not need any further PT services  PT Problem List         PT Treatment Interventions      PT Goals (Current goals can be found in the Care Plan section)  Acute Rehab PT Goals PT Goal Formulation: All assessment and education complete, DC therapy    Frequency  Barriers to discharge        Co-evaluation               AM-PAC PT "6 Clicks" Daily Activity  Outcome Measure Difficulty turning over in bed (including adjusting bedclothes, sheets and blankets)?: None Difficulty moving from lying on back to sitting on the side of the bed? : None Difficulty sitting down on and standing up from a chair with arms (e.g., wheelchair, bedside commode, etc,.)?: None Help needed moving to and from a bed to chair (including a wheelchair)?: A Little Help needed walking in hospital room?: A Little Help needed climbing 3-5 steps with a railing? : A Little 6 Click Score: 21    End of Session Equipment Utilized During Treatment: Gait  belt Activity Tolerance: Patient tolerated treatment well Patient left: in bed;with call bell/phone within reach Nurse Communication: Mobility status PT Visit Diagnosis: History of falling (Z91.81)    Time: 1027-2536 PT Time Calculation (min) (ACUTE ONLY): 17 min   Charges:   PT Evaluation $PT Eval Low Complexity: [place], PT DPT  Board Certified Neurologic Specialist Acute Rehabilitation Services Pager [phone] Office [phone]   Duncan Dull 05/09/2018, 3:40 PM

## 2018-05-09 NOTE — Progress Notes (Signed)
PROGRESS NOTE    Jason SHAKE Sr.  YTK:[id number] DOB: 02-09-1946 DOA: 05/08/2018 PCP: [name], MD   Brief Narrative:  HPI on 05/08/2018 by Dr. Floy Sabina Sr. is a 72 y.o. male with medical history significant of stage IV rectal cancer, chronic kidney disease stage IV, congestive heart failure, hypertension, neuropathy, and gout.  At 8 AM on this day of admission, the patient was in the bathroom preparing for the day.  He felt hot and dizzy (lightheaded) and slowly fell to the floor.  He denies losing consciousness or hitting his head. Pt did not injure any other part of his body. He was down for approximately 1 minute, but conscious the entire time.  Sourish was able to gather himself, get up and moved down to the hallway.  The exact same thing happened where he became flushed and lightheaded and he was down for another minute.  At this point, his wife saw him fall and called an ambulance.  Since that time, he has not had any of that flushed sensation, however he continues to feel lightheaded.  He specifically denies spinning.  He reports eating and drinking normally.  He takes Lasix 80 mg daily with concurrent potassium.  This was recently increased by his nephrologist.  He states it takes around 40 minutes to urinate after taking this medicine and he will urinate 3-4 times during the day.  He has a history of CKD, not on dialysis, but he does have vascular access.  There was a tentative plan to start hemodialysis on an outpatient basis.  There is otherwise no changes in medications.  He follows with the cardiology team for CHF.  Denies any chest pain or shortness of breath, no new swelling or significant weight changes. Assessment & Plan   Acute kidney injury on chronic kidney disease, stage V -Patient presented with creatinine of 6.6, down to 6.29 -baseline per review of patient's chart ranges from 3-4 -Patient follows with Dr. Posey Pronto.  He has access placement. -Patient  was placed on IV fluid -Nephrology consulted and appreciated -Lasix held -Continue to monitor BMP  Fall/generalized weakness -Patient denies any syncopal episodes or loss of consciousness.  He became dizzy and fell yesterday at home -PT consulted.  Chronic CHF -Unknown type- not listed in echocardiogram -Appears to be euvolemic and stable -lasix held -Monitor intake and output, daily weights  Gout -Stable, continue allopurinol  Hypertension -Continue Cardura, metoprolol, Bidil  Obstructive sleep apnea -Continue CPAP  Rectal cancer -fiber and immodium   Normocytic anemia/ Chronic anemia  -Stable, continue to monitor CBC  Hypokalemia -replace and continue to monitor  Lower extremity wounds -will consult wound care   DVT Prophylaxis  Heparin  Code Status: Full  Family Communication: None at bedside  Disposition Plan: Admitted, pending improvement  Consultants Nephrology  Procedures  None  Antibiotics   Anti-infectives (From admission, onward)   None      Subjective:   Jason [name] seen and examined today.  He is to feel weak.  Denies current chest pain, shortness of breath, abdominal pain, nausea vomiting, diarrhea constipation, dizziness or headache.  Objective:   Vitals:   05/08/18 2254 05/08/18 2339 05/09/18 0549 05/09/18 0949  BP: 128/64  119/62 122/61  Pulse: 78 80 75 76  Resp: 18 18 16 18   Temp: 98.1 F (36.7 C)  98.5 F (36.9 C) 97.7 F (36.5 C)  TempSrc: Oral  Oral Oral  SpO2: 99% 96% 98% 99%  Weight:  Height:        Intake/Output Summary (Last 24 hours) at 05/09/2018 1259 Last data filed at 05/09/2018 1610 Gross per 24 hour  Intake 2020.39 ml  Output 0 ml  Net 2020.39 ml   Filed Weights   05/08/18 1335  Weight: 83.2 kg    Exam  General: Well developed, well nourished, NAD, appears stated age  42: NCAT, mucous membranes moist.   Neck: Supple  Cardiovascular: S1 S2 auscultated, RRR, no murmur  Respiratory:  Clear to auscultation bilaterally with equal chest rise  Abdomen: Soft, nontender, nondistended, + bowel sounds  Extremities: warm dry without cyanosis clubbing or edema  Neuro: AAOx3, nonfocal  Psych: Normal affect and demeanor with intact judgement and insight   Data Reviewed: I have personally reviewed following labs and imaging studies  CBC: Recent Labs  Lab 05/08/18 1022 05/08/18 1041 05/09/18 0536  WBC 4.4  --  5.3  NEUTROABS 3.1  --   --   HGB 12.4* 14.6 11.7*  HCT 39.3 43.0 36.1*  MCV 84.5  --  82.2  PLT 109*  --  960*   Basic Metabolic Panel: Recent Labs  Lab 05/08/18 1022 05/08/18 1041 05/09/18 0536  NA 136 136 135  K 3.6 3.6 3.2*  CL 99 100 99  CO2 23  --  25  GLUCOSE 137* 128* 90  BUN 113* 110* 112*  CREATININE 6.06* 6.60* 6.29*  CALCIUM 9.8  --  10.0  MG 1.8  --   --    GFR: Estimated Creatinine Clearance: 11.3 mL/min (A) (by C-G formula based on SCr of 6.29 mg/dL (H)). Liver Function Tests: Recent Labs  Lab 05/08/18 1022  AST 39  ALT 30  ALKPHOS 108  BILITOT 0.8  PROT 5.9*  ALBUMIN 3.5   No results for input(s): LIPASE, AMYLASE in the last 168 hours. No results for input(s): AMMONIA in the last 168 hours. Coagulation Profile: No results for input(s): INR, PROTIME in the last 168 hours. Cardiac Enzymes: No results for input(s): CKTOTAL, CKMB, CKMBINDEX, TROPONINI in the last 168 hours. BNP (last 3 results) No results for input(s): PROBNP in the last 8760 hours. HbA1C: No results for input(s): HGBA1C in the last 72 hours. CBG: Recent Labs  Lab 05/08/18 1024  GLUCAP 130*   Lipid Profile: No results for input(s): CHOL, HDL, LDLCALC, TRIG, CHOLHDL, LDLDIRECT in the last 72 hours. Thyroid Function Tests: No results for input(s): TSH, T4TOTAL, FREET4, T3FREE, THYROIDAB in the last 72 hours. Anemia Panel: No results for input(s): VITAMINB12, FOLATE, FERRITIN, TIBC, IRON, RETICCTPCT in the last 72 hours. Urine analysis:    Component  Value Date/Time   COLORURINE YELLOW 05/08/2018 1314   APPEARANCEUR CLEAR 05/08/2018 1314   LABSPEC 1.010 05/08/2018 1314   PHURINE 5.0 05/08/2018 1314   GLUCOSEU NEGATIVE 05/08/2018 1314   GLUCOSEU NEGATIVE 10/14/2016 1144   HGBUR SMALL (A) 05/08/2018 1314   HGBUR negative 05/29/2010 0952   BILIRUBINUR NEGATIVE 05/08/2018 1314   BILIRUBINUR Negative 10/14/2016 1220   KETONESUR NEGATIVE 05/08/2018 1314   PROTEINUR 100 (A) 05/08/2018 1314   UROBILINOGEN 0.2 10/14/2016 1220   UROBILINOGEN 0.2 10/14/2016 1144   NITRITE NEGATIVE 05/08/2018 1314   LEUKOCYTESUR NEGATIVE 05/08/2018 1314   Sepsis Labs: @LABRCNTIP (procalcitonin:4,lacticidven:4)  )No results found for this or any previous visit (from the past 240 hour(s)).    Radiology Studies: Dg Chest Port 1 View  Result Date: 05/08/2018 CLINICAL DATA:  Dizziness, slid down a wall, near syncopal episode witnessed by family EXAM: PORTABLE  CHEST 1 VIEW COMPARISON:  Portable exam 1036 hours compared to 12/19/2015 FINDINGS: Enlargement of cardiac silhouette. Mediastinal contours and pulmonary vascularity normal. Atherosclerotic calcification aorta. Postsurgical changes LEFT upper lobe. Remaining lungs clear. No definite infiltrate, pleural effusion or pneumothorax. Bones unremarkable. IMPRESSION: Enlargement of cardiac silhouette. No acute abnormalities. Electronically Signed   By: Lavonia Dana M.D.   On: 05/08/2018 11:03     Scheduled Meds: . allopurinol  100 mg Oral Daily  . cholecalciferol  5,000 Units Oral Daily  . doxazosin  8 mg Oral Daily  . heparin  5,000 Units Subcutaneous Q8H  . isosorbide-hydrALAZINE  1 tablet Oral Daily  . metoprolol tartrate  50 mg Oral Daily  . polycarbophil  1,250 mg Oral Daily   Continuous Infusions:   LOS: 1 day   Time Spent in minutes   30 minutes  Talyah Seder D.O. on 05/09/2018 at 12:59 PM  Between 7am to 7pm - Please see pager noted on [email or web address]  After 7pm go to [email or web address]  And look for  the night coverage person covering for me after hours  [facility] Office  [phone]

## 2018-05-10 DIAGNOSIS — N185 Chronic kidney disease, stage 5: Secondary | ICD-10-CM

## 2018-05-10 DIAGNOSIS — N179 Acute kidney failure, unspecified: Principal | ICD-10-CM

## 2018-05-10 DIAGNOSIS — S81801S Unspecified open wound, right lower leg, sequela: Secondary | ICD-10-CM

## 2018-05-10 LAB — BASIC METABOLIC PANEL
Anion gap: 8 (ref 5–15)
BUN: 106 mg/dL — AB (ref 8–23)
CALCIUM: 10 mg/dL (ref 8.9–10.3)
CO2: 25 mmol/L (ref 22–32)
CREATININE: 5.87 mg/dL — AB (ref 0.61–1.24)
Chloride: 101 mmol/L (ref 98–111)
GFR calc non Af Amer: 9 mL/min — ABNORMAL LOW (ref >60)
GFR, EST AFRICAN AMERICAN: 10 mL/min — AB (ref >60)
GLUCOSE: 86 mg/dL (ref 70–99)
Potassium: 3.5 mmol/L (ref 3.5–5.1)
Sodium: 134 mmol/L — ABNORMAL LOW (ref 135–145)

## 2018-05-10 LAB — GLUCOSE, CAPILLARY: Glucose-Capillary: 79 mg/dL (ref 70–99)

## 2018-05-10 MED ORDER — GABAPENTIN 300 MG PO CAPS: 300.0000 mg | ORAL_CAPSULE | Freq: Every day | ORAL | Status: DC

## 2018-05-10 MED ORDER — ALLOPURINOL 100 MG PO TABS: 100.0000 mg | ORAL_TABLET | Freq: Every day | ORAL | 0 refills | Status: DC

## 2018-05-10 MED ORDER — FUROSEMIDE 80 MG PO TABS: 80.0000 mg | ORAL_TABLET | Freq: Every day | ORAL | Status: DC

## 2018-05-10 NOTE — Progress Notes (Signed)
Patient discharged to home with wife.IV is removed, tip intact.  After visit Summary reviewed. Patient capable of reverbalizing medications and follow up visits. No signs and symptoms of distress noted. Patient educated to return to the ED in the case of an emergency. Dillon Bjork RN

## 2018-05-10 NOTE — Discharge Instructions (Signed)

## 2018-05-10 NOTE — Consult Note (Signed)
[facility] Nurse wound consult note Reason for Consult: Consult requested for bilat legs.  Pt states he previously had edema and developed some partial thickness wounds and the locations are almost healed.  Wound type: Left anterior calf with dry scab; .5X.5cm; no open wound, odor, drainage, or fluctuance.  Right anterior calf with the same appearance; 1X.5cm Dressing procedure/placement/frequency: No topical treatment is indicated at this time; scabs will dry up and fall off soon, since they are beginning to lift at the edges.  Please re-consult if further assistance is needed.  Thank-you,  Julien Girt MSN, [place], [place], [place], [place]

## 2018-05-10 NOTE — Discharge Summary (Signed)
Physician Discharge Summary  Jason KRUZEL Sr. QIH:[id number] DOB: 06-06-1946 DOA: 05/08/2018  PCP: [name], MD  Admit date: 05/08/2018 Discharge date: 05/10/2018  Time spent: 45 minutes  Recommendations for Outpatient Follow-up:  Patient will be discharged to home.  Patient will need to follow up with primary care provider within one week of discharge. Follow up with nephrology, Dr. Posey Pronto, in 2 weeks, and repeat BMP. Patient should continue medications as prescribed.  Patient should follow a renal diet.   Discharge Diagnoses:  Acute kidney injury on chronic kidney disease, stage V Fall/generalized weakness Chronic CHF Gout Hypertension Obstructive sleep apnea Rectal cancer Normocytic anemia/ Chronic anemia  Hypokalemia Lower extremity wounds  Discharge Condition: Stable  Diet recommendation: renal  Filed Weights   05/08/18 1335 05/09/18 2106  Weight: 83.2 kg 83.2 kg    History of present illness:  on 05/08/2018 by Dr. Floy Sabina Conwayis a 72 y.o.malewith medical history significant ofstage IV rectal cancer, chronic kidney disease stage IV, congestive heart failure, hypertension, neuropathy, and gout.  At 8 AM onthisday of admission, the patient was in the bathroom preparing for the day. He felt hot and dizzy (lightheaded) and slowly fell to the floor. He denies losing consciousness or hitting his head. Pt did not injure any other part of his body.He was down for approximately 1 minute,but conscious the entire time.Glenwas able to gather himself,get up and moved down to the hallway. The exact same thing happened where he became flushed and lightheaded and he was down for another minute. At this point, his wife saw him falland called an ambulance. Since that time, he has not had any of that flushed sensation, however he continues to feel lightheaded. He specifically denies spinning. He reportseating and drinking normally. He takes  Lasix 80 mg daily with concurrent potassium. This was recently increased by his nephrologist. He states it takes around 40 minutes to urinate after taking this medicine and he will urinate 3-4 times during the day. He has a history of CKD, not on dialysis, but he does have vascular access. There was a tentative plan to start hemodialysis on an outpatient basis. There is otherwise no changes in medications. He follows with the cardiology team for CHF. Denies any chest pain or shortness of breath, no new swelling or significant weight changes.  Hospital Course:  Acute kidney injury on chronic kidney disease, stage V -Patient presented with creatinine of 6.6, down to 5.87 -baseline per review of patient's chart ranges from 3-4 -Patient follows with Dr. Posey Pronto.  He has access placement. -Patient was placed on IV fluid -Nephrology consulted and appreciated- recommended follow up with Dr. Posey Pronto -Lasix held -Repeat BMP in 1-2 weeks  Fall/generalized weakness -Patient denies any syncopal episodes or loss of consciousness.  He became dizzy and fell yesterday at home -PT consulted, no further recommendations or therapy needed  Chronic CHF -Unknown type- not listed in echocardiogram -Appears to be euvolemic and stable -lasix held -Monitor intake and output, daily weights  Gout -Stable, continue allopurinol  Hypertension -Continue Cardura, metoprolol  Obstructive sleep apnea -Continue CPAP  Rectal cancer -Continue fiber and immodium   Normocytic anemia/ Chronic anemia  -Stable  Hypokalemia -resolved, repeat BMP in 1-2 weeks  Lower extremity wounds -Wound care consulted, no topical treatment indicated at this time  Consultants Nephrology  Procedures  None  Discharge Exam: Vitals:   05/09/18 2106 05/10/18 0802  BP: (!) 146/81 131/68  Pulse: 77 84  Resp: 18 18  Temp: 98 F (36.7 C) 98.5 F (36.9 C)  SpO2: 98% 99%   Patient denies chest pain, shortness of  breath, abdominal pain, nausea or vomiting, diarrhea or constipation, dizziness or headache.  States he is feeling much better, no longer feeling weak.  Would like to go home.   General: Well developed, well nourished, NAD, appears stated age  HEENT: NCAT, mucous membranes moist.  Neck: Supple  Cardiovascular: S1 S2 auscultated, RRR, +SEM  Respiratory: Clear to auscultation bilaterally with equal chest rise  Abdomen: Soft, nontender, nondistended, + bowel sounds  Extremities: warm dry without cyanosis clubbing or edema. Wound on LE  Neuro: AAOx3, nonfocal  Psych: Normal affect and demeanor, pleasant  Discharge Instructions Discharge Instructions    Discharge instructions   Complete by:  As directed    Patient will be discharged to home.  Patient will need to follow up with primary care provider within one week of discharge. Follow up with nephrology, Dr. Posey Pronto, in 2 weeks, and repeat BMP. Patient should continue medications as prescribed.  Patient should follow a renal diet.   Restart lasix on 05/12/2018.     Allergies as of 05/10/2018      Reactions   Nsaids Other (See Comments)   Asked by surgeon to add this medication class as intolerance due to patients renal insufficiency.   Amlodipine Other (See Comments)   Edema      Medication List    TAKE these medications   acetaminophen 325 MG tablet Commonly known as:  TYLENOL Take 650 mg by mouth every 6 (six) hours as needed.   allopurinol 100 MG tablet Commonly known as:  ZYLOPRIM Take 1 tablet (100 mg total) by mouth daily. Start taking on:  05/11/2018 What changed:    medication strength  how much to take   calcium carbonate 1500 (600 Ca) MG Tabs tablet Commonly known as:  OSCAL Take 600 mg of elemental calcium by mouth daily.   cholecalciferol 1000 units tablet Commonly known as:  VITAMIN D Take 5,000 Units by mouth daily.   doxazosin 8 MG tablet Commonly known as:  CARDURA Take 1 tablet (8 mg total) by  mouth daily.   furosemide 80 MG tablet Commonly known as:  LASIX Take 1 tablet (80 mg total) by mouth daily. Start taking on:  05/12/2018 What changed:  These instructions start on 05/12/2018. If you are unsure what to do until then, ask your doctor or other care provider.   gabapentin 300 MG capsule Commonly known as:  NEURONTIN Take 1 capsule (300 mg total) by mouth at bedtime. Take 300mg s morning and 600mg s at bedtime What changed:  when to take this   KLOR-CON M20 20 MEQ tablet Generic drug:  potassium chloride SA Take 1 tablet (20 mEq total) by mouth daily.   loperamide 2 MG capsule Commonly known as:  IMODIUM Take 2 mg by mouth as needed for diarrhea or loose stools.   metoprolol succinate 50 MG 24 hr tablet Commonly known as:  TOPROL-XL Take 50 mg by mouth daily.   polycarbophil 625 MG tablet Commonly known as:  FIBERCON Take 1,250 mg by mouth as needed.      Allergies  Allergen Reactions  . Nsaids Other (See Comments)    Asked by surgeon to add this medication class as intolerance due to patients renal insufficiency.  . Amlodipine Other (See Comments)    Edema    Follow-up Information    [name], MD. Schedule an appointment as soon  as possible for a visit in 1 week(s).   Specialty:  Internal Medicine Why:  Hospital follow up Contact information: [address] [address] [phone]        Elmarie Shiley, MD. Schedule an appointment as soon as possible for a visit in 2 week(s).   Specialty:  Nephrology Why:  Hospital follow up Contact information: [place] [address] [phone]            The results of significant diagnostics from this hospitalization (including imaging, microbiology, ancillary and laboratory) are listed below for reference.    Significant Diagnostic Studies: Dg Chest Port 1 View  Result Date: 05/08/2018 CLINICAL DATA:  Dizziness, slid down a wall, near syncopal episode witnessed  by family EXAM: PORTABLE CHEST 1 VIEW COMPARISON:  Portable exam 1036 hours compared to 12/19/2015 FINDINGS: Enlargement of cardiac silhouette. Mediastinal contours and pulmonary vascularity normal. Atherosclerotic calcification aorta. Postsurgical changes LEFT upper lobe. Remaining lungs clear. No definite infiltrate, pleural effusion or pneumothorax. Bones unremarkable. IMPRESSION: Enlargement of cardiac silhouette. No acute abnormalities. Electronically Signed   By: Lavonia Dana M.D.   On: 05/08/2018 11:03    Microbiology: No results found for this or any previous visit (from the past 240 hour(s)).   Labs: Basic Metabolic Panel: Recent Labs  Lab 05/08/18 1022 05/08/18 1041 05/09/18 0536 05/10/18 0524  NA 136 136 135 134*  K 3.6 3.6 3.2* 3.5  CL 99 100 99 101  CO2 23  --  25 25  GLUCOSE 137* 128* 90 86  BUN 113* 110* 112* 106*  CREATININE 6.06* 6.60* 6.29* 5.87*  CALCIUM 9.8  --  10.0 10.0  MG 1.8  --   --   --    Liver Function Tests: Recent Labs  Lab 05/08/18 1022  AST 39  ALT 30  ALKPHOS 108  BILITOT 0.8  PROT 5.9*  ALBUMIN 3.5   No results for input(s): LIPASE, AMYLASE in the last 168 hours. No results for input(s): AMMONIA in the last 168 hours. CBC: Recent Labs  Lab 05/08/18 1022 05/08/18 1041 05/09/18 0536  WBC 4.4  --  5.3  NEUTROABS 3.1  --   --   HGB 12.4* 14.6 11.7*  HCT 39.3 43.0 36.1*  MCV 84.5  --  82.2  PLT 109*  --  117*   Cardiac Enzymes: No results for input(s): CKTOTAL, CKMB, CKMBINDEX, TROPONINI in the last 168 hours. BNP: BNP (last 3 results) No results for input(s): BNP in the last 8760 hours.  ProBNP (last 3 results) No results for input(s): PROBNP in the last 8760 hours.  CBG: Recent Labs  Lab 05/08/18 1024 05/10/18 0803  GLUCAP 130* 79       Signed:  Cruz Devilla  [facility] 05/10/2018, 10:21 AM

## 2018-05-11 ENCOUNTER — Telehealth: Payer: Self-pay | Admitting: *Deleted

## 2018-05-11 NOTE — Telephone Encounter (Signed)
Transition Care Management Follow-up Telephone Call   Date discharged? 05/10/18   How have you been since you were released from the hospital? Pt states he is doing alright   Do you understand why you were in the hospital? YES   Do you understand the discharge instructions? YES   Where were you discharged to? Home   Items Reviewed:  Medications reviewed: YES  Allergies reviewed: YES  Dietary changes reviewed: YES, renal diet  Referrals reviewed: YES, he states he has already scheduled appt w/dr. Posey Pronto   Functional Questionnaire:   Activities of Daily Living (ADLs):   He states he are independent in the following: bathing and hygiene, feeding, continence, grooming, toileting and dressing States they require assistance with the following: ambulation   Any transportation issues/concerns?: NO   Any patient concerns? NO   Confirmed importance and date/time of follow-up visits scheduled YES, appt 05/17/18  Provider Appointment booked with Dr. Ronnald Ramp  Confirmed with patient if condition begins to worsen call PCP or go to the ER.  Patient was given the office number and encouraged to call back with question or concerns.  : YES

## 2018-05-17 ENCOUNTER — Ambulatory Visit: Payer: Medicare Other | Admitting: Internal Medicine

## 2018-05-17 ENCOUNTER — Other Ambulatory Visit (INDEPENDENT_AMBULATORY_CARE_PROVIDER_SITE_OTHER): Payer: Medicare Other

## 2018-05-17 ENCOUNTER — Encounter: Payer: Self-pay | Admitting: Internal Medicine

## 2018-05-17 VITALS — BP 148/84 | HR 96 | Temp 98.2°F | Resp 16 | Ht 68.0 in | Wt 192.5 lb

## 2018-05-17 DIAGNOSIS — I5020 Unspecified systolic (congestive) heart failure: Secondary | ICD-10-CM

## 2018-05-17 DIAGNOSIS — N184 Chronic kidney disease, stage 4 (severe): Secondary | ICD-10-CM

## 2018-05-17 DIAGNOSIS — D649 Anemia, unspecified: Secondary | ICD-10-CM | POA: Diagnosis not present

## 2018-05-17 DIAGNOSIS — I10 Essential (primary) hypertension: Secondary | ICD-10-CM | POA: Diagnosis not present

## 2018-05-17 LAB — CBC WITH DIFFERENTIAL/PLATELET
BASOS PCT: 1 % (ref 0.0–3.0)
Basophils Absolute: 0.1 10*3/uL (ref 0.0–0.1)
Eosinophils Absolute: 0.1 10*3/uL (ref 0.0–0.7)
Eosinophils Relative: 1.2 % (ref 0.0–5.0)
HEMATOCRIT: 37.1 % — AB (ref 39.0–52.0)
HEMOGLOBIN: 12.1 g/dL — AB (ref 13.0–17.0)
Lymphocytes Relative: 10.1 % — ABNORMAL LOW (ref 12.0–46.0)
Lymphs Abs: 0.6 10*3/uL — ABNORMAL LOW (ref 0.7–4.0)
MCHC: 32.5 g/dL (ref 30.0–36.0)
MCV: 86.1 fl (ref 78.0–100.0)
MONOS PCT: 17.2 % — AB (ref 3.0–12.0)
Monocytes Absolute: 1 10*3/uL (ref 0.1–1.0)
Neutro Abs: 4.1 10*3/uL (ref 1.4–7.7)
Neutrophils Relative %: 70.5 % (ref 43.0–77.0)
Platelets: 181 10*3/uL (ref 150.0–400.0)
RBC: 4.31 Mil/uL (ref 4.22–5.81)
RDW: 21.3 % — AB (ref 11.5–15.5)
WBC: 5.8 10*3/uL (ref 4.0–10.5)

## 2018-05-17 LAB — BASIC METABOLIC PANEL
BUN: 77 mg/dL — AB (ref 6–23)
CO2: 29 meq/L (ref 19–32)
Calcium: 10.8 mg/dL — ABNORMAL HIGH (ref 8.4–10.5)
Chloride: 100 mEq/L (ref 96–112)
Creatinine, Ser: 5.06 mg/dL (ref 0.40–1.50)
GFR: 14.56 mL/min — CL (ref >60.00)
Glucose, Bld: 86 mg/dL (ref 70–99)
POTASSIUM: 3.9 meq/L (ref 3.5–5.1)
SODIUM: 139 meq/L (ref 135–145)

## 2018-05-17 LAB — IBC PANEL
Iron: 55 ug/dL (ref 42–165)
Saturation Ratios: 21.5 % (ref 20.0–50.0)
Transferrin: 183 mg/dL — ABNORMAL LOW (ref 212.0–360.0)

## 2018-05-17 LAB — FOLATE: Folate: 15 ng/mL (ref >5.9)

## 2018-05-17 LAB — VITAMIN B12: VITAMIN B 12: 796 pg/mL (ref 211–911)

## 2018-05-17 LAB — FERRITIN: Ferritin: 587.6 ng/mL — ABNORMAL HIGH (ref 22.0–322.0)

## 2018-05-17 NOTE — Patient Instructions (Signed)
Anemia Anemia is a condition in which you do not have enough red blood cells or hemoglobin. Hemoglobin is a substance in red blood cells that carries oxygen. When you do not have enough red blood cells or hemoglobin (are anemic), your body cannot get enough oxygen and your organs may not work properly. As a result, you may feel very tired or have other problems. What are the causes? Common causes of anemia include:  Excessive bleeding. Anemia can be caused by excessive bleeding inside or outside the body, including bleeding from the intestine or from periods in women.  Poor nutrition.  Long-lasting (chronic) kidney, thyroid, and liver disease.  Bone marrow disorders.  Cancer and treatments for cancer.  HIV (human immunodeficiency virus) and AIDS (acquired immunodeficiency syndrome).  Treatments for HIV and AIDS.  Spleen problems.  Blood disorders.  Infections, medicines, and autoimmune disorders that destroy red blood cells.  What are the signs or symptoms? Symptoms of this condition include:  Minor weakness.  Dizziness.  Headache.  Feeling heartbeats that are irregular or faster than normal (palpitations).  Shortness of breath, especially with exercise.  Paleness.  Cold sensitivity.  Indigestion.  Nausea.  Difficulty sleeping.  Difficulty concentrating.  Symptoms may occur suddenly or develop slowly. If your anemia is mild, you may not have symptoms. How is this diagnosed? This condition is diagnosed based on:  Blood tests.  Your medical history.  A physical exam.  Bone marrow biopsy.  Your health care provider may also check your stool (feces) for blood and may do additional testing to look for the cause of your bleeding. You may also have other tests, including:  Imaging tests, such as a CT scan or MRI.  Endoscopy.  Colonoscopy.  How is this treated? Treatment for this condition depends on the cause. If you continue to lose a lot of blood,  you may need to be treated at a hospital. Treatment may include:  Taking supplements of iron, vitamin B12, or folic acid.  Taking a hormone medicine (erythropoietin) that can help to stimulate red blood cell growth.  Having a blood transfusion. This may be needed if you lose a lot of blood.  Making changes to your diet.  Having surgery to remove your spleen.  Follow these instructions at home:  Take over-the-counter and prescription medicines only as told by your health care provider.  Take supplements only as told by your health care provider.  Follow any diet instructions that you were given.  Keep all follow-up visits as told by your health care provider. This is important. Contact a health care provider if:  You develop new bleeding anywhere in the body. Get help right away if:  You are very weak.  You are short of breath.  You have pain in your abdomen or chest.  You are dizzy or feel faint.  You have trouble concentrating.  You have bloody or black, tarry stools.  You vomit repeatedly or you vomit up blood. Summary  Anemia is a condition in which you do not have enough red blood cells or enough of a substance in your red blood cells that carries oxygen (hemoglobin).  Symptoms may occur suddenly or develop slowly.  If your anemia is mild, you may not have symptoms.  This condition is diagnosed with blood tests as well as a medical history and physical exam. Other tests may be needed.  Treatment for this condition depends on the cause of the anemia. This information is not intended to replace advice   given to you by your health care provider. Make sure you discuss any questions you have with your health care provider. Document Released: 07/31/2004 Document Revised: 07/25/2016 Document Reviewed: 07/25/2016 Elsevier Interactive Patient Education  [employer].

## 2018-05-17 NOTE — Progress Notes (Signed)
Subjective:  Patient ID: Jason Cotta Sr., male    DOB: Nov 14, 1945  Age: 72 y.o. MRN: [id number]  CC: Congestive Heart Failure; Anemia; and Hypertension   HPI Jason Cotta Sr. presents for f/up - He was recently admitted after a syncopal episode.  He had had a 1 day history of a diarrheal illness and then became dizzy, hot, and sweaty and passed out.  He was found to be dehydrated with prerenal azotemia.  He tells me the symptoms have resolved.  His diarrhea has resolved and he is having about 2 normal bowel movements per day.  Outpatient Medications Prior to Visit  Medication Sig Dispense Refill  . acetaminophen (TYLENOL) 325 MG tablet Take 650 mg by mouth every 6 (six) hours as needed.    Marland Kitchen allopurinol (ZYLOPRIM) 100 MG tablet Take 1 tablet (100 mg total) by mouth daily. 30 tablet 0  . calcium carbonate (OSCAL) 1500 (600 Ca) MG TABS tablet Take 600 mg of elemental calcium by mouth daily.    . cholecalciferol (VITAMIN D) 1000 units tablet Take 5,000 Units by mouth daily.    Marland Kitchen doxazosin (CARDURA) 8 MG tablet Take 1 tablet (8 mg total) by mouth daily. 90 tablet 0  . furosemide (LASIX) 80 MG tablet Take 1 tablet (80 mg total) by mouth daily.    Marland Kitchen gabapentin (NEURONTIN) 300 MG capsule Take 1 capsule (300 mg total) by mouth at bedtime. Take 300mg s morning and 600mg s at bedtime    . KLOR-CON M20 20 MEQ tablet Take 1 tablet (20 mEq total) by mouth daily. 90 tablet 1  . metoprolol succinate (TOPROL-XL) 50 MG 24 hr tablet Take 50 mg by mouth daily.    . polycarbophil (FIBERCON) 625 MG tablet Take 1,250 mg by mouth as needed.     . loperamide (IMODIUM) 2 MG capsule Take 2 mg by mouth as needed for diarrhea or loose stools.     No facility-administered medications prior to visit.     ROS Review of Systems  Constitutional: Negative for chills, diaphoresis, fatigue and fever.  HENT: Negative.   Eyes: Negative for visual disturbance.  Respiratory: Negative.  Negative for cough, chest tightness,  shortness of breath and wheezing.   Gastrointestinal: Negative for abdominal pain, constipation, diarrhea, nausea and vomiting.  Endocrine: Negative.   Genitourinary: Negative.  Negative for difficulty urinating, dysuria and hematuria.  Musculoskeletal: Negative.  Negative for arthralgias, joint swelling and myalgias.  Skin: Negative.  Negative for color change.  Neurological: Negative.  Negative for dizziness, weakness and light-headedness.  Hematological: Negative for adenopathy. Does not bruise/bleed easily.  Psychiatric/Behavioral: Negative.     Objective:  BP (!) 148/84 (BP Location: Left Arm, Patient Position: Sitting, Cuff Size: Normal)   Pulse 96   Temp 98.2 F (36.8 C) (Oral)   Resp 16   Ht 5\' 8"  (1.727 m)   Wt 192 lb 8 oz (87.3 kg)   SpO2 95%   BMI 29.27 kg/m   BP Readings from Last 3 Encounters:  05/17/18 (!) 148/84  05/10/18 131/68  04/12/18 (!) 146/82    Wt Readings from Last 3 Encounters:  05/17/18 192 lb 8 oz (87.3 kg)  05/09/18 183 lb 6.8 oz (83.2 kg)  04/12/18 206 lb (93.4 kg)    Physical Exam  Constitutional: He is oriented to person, place, and time. No distress.  HENT:  Mouth/Throat: Oropharynx is clear and moist. No oropharyngeal exudate.  Eyes: Conjunctivae are normal. No scleral icterus.  Neck: Normal range of  motion. Neck supple. No JVD present. No thyromegaly present.  Cardiovascular: Normal rate, regular rhythm and normal heart sounds. Exam reveals no gallop.  No murmur heard. Pulmonary/Chest: Effort normal and breath sounds normal. No respiratory distress. He has no wheezes. He has no rhonchi. He has no rales.  Abdominal: Soft. Bowel sounds are normal. He exhibits no mass. There is no tenderness.  Musculoskeletal: Normal range of motion. He exhibits no edema, tenderness or deformity.  Lymphadenopathy:    He has no cervical adenopathy.  Neurological: He is alert and oriented to person, place, and time.  Skin: Skin is warm and dry. No rash  noted. He is not diaphoretic.  Vitals reviewed.   Lab Results  Component Value Date   WBC 5.8 05/17/2018   HGB 12.1 (L) 05/17/2018   HCT 37.1 (L) 05/17/2018   PLT 181.0 05/17/2018   GLUCOSE 86 05/17/2018   CHOL 156 09/23/2017   TRIG 98.0 09/23/2017   HDL 50.90 09/23/2017   LDLDIRECT 120.0 06/04/2015   LDLCALC 85 09/23/2017   ALT 30 05/08/2018   AST 39 05/08/2018   NA 139 05/17/2018   K 3.9 05/17/2018   CL 100 05/17/2018   CREATININE 5.06 (HH) 05/17/2018   BUN 77 (H) 05/17/2018   CO2 29 05/17/2018   TSH 5.41 (H) 09/23/2017   PSA 1.86 08/10/2013   INR 1.11 11/16/2015   HGBA1C 6.0 12/20/2009    Dg Chest Port 1 View  Result Date: 05/08/2018 CLINICAL DATA:  Dizziness, slid down a wall, near syncopal episode witnessed by family EXAM: PORTABLE CHEST 1 VIEW COMPARISON:  Portable exam 1036 hours compared to 12/19/2015 FINDINGS: Enlargement of cardiac silhouette. Mediastinal contours and pulmonary vascularity normal. Atherosclerotic calcification aorta. Postsurgical changes LEFT upper lobe. Remaining lungs clear. No definite infiltrate, pleural effusion or pneumothorax. Bones unremarkable. IMPRESSION: Enlargement of cardiac silhouette. No acute abnormalities. Electronically Signed   By: Lavonia Dana M.D.   On: 05/08/2018 11:03    Assessment & Plan:   Eliberto was seen today for congestive heart failure, anemia and hypertension.  Diagnoses and all orders for this visit:  Chronic renal impairment, stage 4 (severe) (HCC) - The prerenal azotemia has improved some but his creatinine clearance is down to 14.4.  He will see his nephrologist within the next week. -     Basic metabolic panel; Future  Essential hypertension- His blood pressure is adequately well controlled. -     Basic metabolic panel; Future  Systolic congestive heart failure, unspecified HF chronicity ([place])- He has a normal volume status.  Will continue the current combination of lasix, metoprolol, isosorbide, and  hydralazine.  Normocytic anemia- His H&H have improved.  His ferritin level is elevated but the other vitamin levels are normal.  This is consistent with the anemia of chronic disease. -     IBC panel; Future -     CBC with Differential/Platelet; Future -     Vitamin B12; Future -     Ferritin; Future -     Folate; Future   I have discontinued Jason Cotta Sr. "[name] Sr"'s loperamide. I am also having him maintain his polycarbophil, cholecalciferol, KLOR-CON M20, doxazosin, acetaminophen, calcium carbonate, metoprolol succinate, allopurinol, furosemide, and gabapentin.  No orders of the defined types were placed in this encounter.    Follow-up: Return if symptoms worsen or fail to improve.  [name], MD

## 2018-05-31 ENCOUNTER — Encounter: Payer: Self-pay | Admitting: Podiatry

## 2018-05-31 ENCOUNTER — Other Ambulatory Visit: Payer: Self-pay | Admitting: Podiatry

## 2018-05-31 ENCOUNTER — Encounter

## 2018-05-31 ENCOUNTER — Ambulatory Visit (INDEPENDENT_AMBULATORY_CARE_PROVIDER_SITE_OTHER): Payer: Self-pay

## 2018-05-31 ENCOUNTER — Ambulatory Visit: Payer: Medicare Other | Admitting: Podiatry

## 2018-05-31 VITALS — BP 130/65 | HR 82 | Resp 16

## 2018-05-31 DIAGNOSIS — M779 Enthesopathy, unspecified: Secondary | ICD-10-CM

## 2018-05-31 DIAGNOSIS — M79672 Pain in left foot: Secondary | ICD-10-CM

## 2018-05-31 DIAGNOSIS — M205X9 Other deformities of toe(s) (acquired), unspecified foot: Secondary | ICD-10-CM

## 2018-05-31 DIAGNOSIS — N185 Chronic kidney disease, stage 5: Secondary | ICD-10-CM | POA: Diagnosis not present

## 2018-05-31 DIAGNOSIS — M79671 Pain in right foot: Secondary | ICD-10-CM | POA: Diagnosis not present

## 2018-05-31 DIAGNOSIS — I120 Hypertensive chronic kidney disease with stage 5 chronic kidney disease or end stage renal disease: Secondary | ICD-10-CM | POA: Diagnosis not present

## 2018-05-31 DIAGNOSIS — N2581 Secondary hyperparathyroidism of renal origin: Secondary | ICD-10-CM | POA: Diagnosis not present

## 2018-05-31 DIAGNOSIS — D631 Anemia in chronic kidney disease: Secondary | ICD-10-CM | POA: Diagnosis not present

## 2018-05-31 DIAGNOSIS — I999 Unspecified disorder of circulatory system: Secondary | ICD-10-CM | POA: Diagnosis not present

## 2018-05-31 MED ORDER — TRIAMCINOLONE ACETONIDE 10 MG/ML IJ SUSP
10.0000 mg | Freq: Once | INTRAMUSCULAR | Status: AC
  Administered 2018-05-31: 10 mg

## 2018-05-31 NOTE — Progress Notes (Signed)
   Subjective:    Patient ID: Jason Cotta Sr., male    DOB: May 01, 1946, 72 y.o.   MRN: [id number]  HPI    Review of Systems  All other systems reviewed and are negative.      Objective:   Physical Exam        Assessment & Plan:

## 2018-05-31 NOTE — Patient Instructions (Signed)

## 2018-06-01 NOTE — Progress Notes (Signed)
Subjective:   Patient ID: Jason Cotta Sr., male   DOB: 72 y.o.   MRN: [id number]   HPI Patient presents stating he is getting a lot of pain in the big toe joints of both feet and states that it is gradually become more painful as time is gone on.  Patient states that he tries shoe gear change modifications and exercise but he still has a lot of pain the patient does not currently smoke and likes to be active   Review of Systems  All other systems reviewed and are negative.       Objective:  Physical Exam  Constitutional: He appears well-developed and well-nourished.  Cardiovascular: Intact distal pulses.  Pulmonary/Chest: Effort normal.  Musculoskeletal: Normal range of motion.  Neurological: He is alert.  Skin: Skin is warm.  Nursing note and vitals reviewed.   Neurovascular status intact muscle strength is adequate range of motion within normal limits with patient found to have exquisite discomfort in the first MPJ bilateral with inflammation fluid around the joint surface and pain with palpation with what appears to be bone spur formation.  Patient is not in overall good general health and does have history of heart issues and gout     Assessment:  What appears to be hallux limitus rigidus deformity bilateral with inflammatory capsulitis noted     Plan:  H&P x-rays reviewed and today I did inject around the first MPJ periarticular fashion after sterile prep 3 mg Kenalog 5 Milgram Xylocaine bilateral and applied sterile dressing.  Discussed that we are trying to avoid surgery and I am hopeful that we could do this but patient will need to be watched and we will see how he responds to conservative treatment  X-ray indicates there is bone spur formation narrowing of the joint surface first MPJ bilateral

## 2018-06-06 DIAGNOSIS — I639 Cerebral infarction, unspecified: Secondary | ICD-10-CM

## 2018-06-06 HISTORY — DX: Cerebral infarction, unspecified: I63.9

## 2018-06-07 ENCOUNTER — Telehealth: Payer: Self-pay | Admitting: Podiatry

## 2018-06-07 NOTE — Telephone Encounter (Signed)
Pt states he is having sharp pains over different parts of his feet, not related to the areas he got the injections last week.

## 2018-06-07 NOTE — Telephone Encounter (Signed)
"  I started having sharp pain in my foot, I was wondering if I can get some Gabepentin". I use the [facility] on [address]

## 2018-06-08 ENCOUNTER — Encounter (HOSPITAL_COMMUNITY)
Admission: EM | Disposition: A | Discharge status: Rehabilitation Facility | Payer: Self-pay | Source: Home / Self Care | Attending: Neurology

## 2018-06-08 ENCOUNTER — Emergency Department (HOSPITAL_COMMUNITY): Payer: Medicare Other

## 2018-06-08 ENCOUNTER — Emergency Department (HOSPITAL_COMMUNITY): Payer: Medicare Other | Admitting: Certified Registered"

## 2018-06-08 ENCOUNTER — Inpatient Hospital Stay (HOSPITAL_COMMUNITY)
Admission: EM | Admit: 2018-06-08 | Discharge: 2018-06-16 | DRG: 023 | Disposition: A | Discharge status: Rehabilitation Facility | Payer: Medicare Other | Attending: Neurology | Admitting: Neurology

## 2018-06-08 ENCOUNTER — Inpatient Hospital Stay (HOSPITAL_COMMUNITY): Payer: Medicare Other

## 2018-06-08 DIAGNOSIS — Z85048 Personal history of other malignant neoplasm of rectum, rectosigmoid junction, and anus: Secondary | ICD-10-CM | POA: Diagnosis not present

## 2018-06-08 DIAGNOSIS — R0689 Other abnormalities of breathing: Secondary | ICD-10-CM | POA: Diagnosis not present

## 2018-06-08 DIAGNOSIS — I6523 Occlusion and stenosis of bilateral carotid arteries: Secondary | ICD-10-CM | POA: Diagnosis not present

## 2018-06-08 DIAGNOSIS — G4733 Obstructive sleep apnea (adult) (pediatric): Secondary | ICD-10-CM | POA: Diagnosis present

## 2018-06-08 DIAGNOSIS — Y712 Prosthetic and other implants, materials and accessory cardiovascular devices associated with adverse incidents: Secondary | ICD-10-CM | POA: Diagnosis not present

## 2018-06-08 DIAGNOSIS — Z4659 Encounter for fitting and adjustment of other gastrointestinal appliance and device: Secondary | ICD-10-CM

## 2018-06-08 DIAGNOSIS — G629 Polyneuropathy, unspecified: Secondary | ICD-10-CM | POA: Diagnosis not present

## 2018-06-08 DIAGNOSIS — G8114 Spastic hemiplegia affecting left nondominant side: Secondary | ICD-10-CM | POA: Diagnosis not present

## 2018-06-08 DIAGNOSIS — I69398 Other sequelae of cerebral infarction: Secondary | ICD-10-CM | POA: Diagnosis not present

## 2018-06-08 DIAGNOSIS — I132 Hypertensive heart and chronic kidney disease with heart failure and with stage 5 chronic kidney disease, or end stage renal disease: Secondary | ICD-10-CM | POA: Diagnosis present

## 2018-06-08 DIAGNOSIS — R1310 Dysphagia, unspecified: Secondary | ICD-10-CM | POA: Diagnosis present

## 2018-06-08 DIAGNOSIS — T82898A Other specified complication of vascular prosthetic devices, implants and grafts, initial encounter: Secondary | ICD-10-CM | POA: Diagnosis not present

## 2018-06-08 DIAGNOSIS — T82590A Other mechanical complication of surgically created arteriovenous fistula, initial encounter: Secondary | ICD-10-CM | POA: Diagnosis not present

## 2018-06-08 DIAGNOSIS — D631 Anemia in chronic kidney disease: Secondary | ICD-10-CM | POA: Diagnosis present

## 2018-06-08 DIAGNOSIS — I6501 Occlusion and stenosis of right vertebral artery: Secondary | ICD-10-CM | POA: Diagnosis not present

## 2018-06-08 DIAGNOSIS — Z902 Acquired absence of lung [part of]: Secondary | ICD-10-CM

## 2018-06-08 DIAGNOSIS — R404 Transient alteration of awareness: Secondary | ICD-10-CM | POA: Diagnosis not present

## 2018-06-08 DIAGNOSIS — M109 Gout, unspecified: Secondary | ICD-10-CM | POA: Diagnosis present

## 2018-06-08 DIAGNOSIS — E1122 Type 2 diabetes mellitus with diabetic chronic kidney disease: Secondary | ICD-10-CM | POA: Diagnosis not present

## 2018-06-08 DIAGNOSIS — N179 Acute kidney failure, unspecified: Secondary | ICD-10-CM | POA: Diagnosis present

## 2018-06-08 DIAGNOSIS — D638 Anemia in other chronic diseases classified elsewhere: Secondary | ICD-10-CM | POA: Diagnosis not present

## 2018-06-08 DIAGNOSIS — R29810 Facial weakness: Secondary | ICD-10-CM | POA: Diagnosis not present

## 2018-06-08 DIAGNOSIS — R29718 NIHSS score 18: Secondary | ICD-10-CM | POA: Diagnosis not present

## 2018-06-08 DIAGNOSIS — D72829 Elevated white blood cell count, unspecified: Secondary | ICD-10-CM | POA: Diagnosis not present

## 2018-06-08 DIAGNOSIS — I63433 Cerebral infarction due to embolism of bilateral posterior cerebral arteries: Secondary | ICD-10-CM | POA: Diagnosis not present

## 2018-06-08 DIAGNOSIS — D649 Anemia, unspecified: Secondary | ICD-10-CM | POA: Diagnosis present

## 2018-06-08 DIAGNOSIS — G8194 Hemiplegia, unspecified affecting left nondominant side: Secondary | ICD-10-CM | POA: Diagnosis not present

## 2018-06-08 DIAGNOSIS — E785 Hyperlipidemia, unspecified: Secondary | ICD-10-CM | POA: Diagnosis present

## 2018-06-08 DIAGNOSIS — J690 Pneumonitis due to inhalation of food and vomit: Secondary | ICD-10-CM

## 2018-06-08 DIAGNOSIS — Z87891 Personal history of nicotine dependence: Secondary | ICD-10-CM

## 2018-06-08 DIAGNOSIS — Z515 Encounter for palliative care: Secondary | ICD-10-CM | POA: Diagnosis not present

## 2018-06-08 DIAGNOSIS — R739 Hyperglycemia, unspecified: Secondary | ICD-10-CM | POA: Diagnosis not present

## 2018-06-08 DIAGNOSIS — Z79899 Other long term (current) drug therapy: Secondary | ICD-10-CM

## 2018-06-08 DIAGNOSIS — Z923 Personal history of irradiation: Secondary | ICD-10-CM | POA: Diagnosis not present

## 2018-06-08 DIAGNOSIS — E876 Hypokalemia: Secondary | ICD-10-CM | POA: Diagnosis not present

## 2018-06-08 DIAGNOSIS — N2581 Secondary hyperparathyroidism of renal origin: Secondary | ICD-10-CM | POA: Diagnosis not present

## 2018-06-08 DIAGNOSIS — I5042 Chronic combined systolic (congestive) and diastolic (congestive) heart failure: Secondary | ICD-10-CM | POA: Diagnosis not present

## 2018-06-08 DIAGNOSIS — I639 Cerebral infarction, unspecified: Secondary | ICD-10-CM

## 2018-06-08 DIAGNOSIS — Z9221 Personal history of antineoplastic chemotherapy: Secondary | ICD-10-CM

## 2018-06-08 DIAGNOSIS — I6521 Occlusion and stenosis of right carotid artery: Secondary | ICD-10-CM | POA: Diagnosis not present

## 2018-06-08 DIAGNOSIS — N186 End stage renal disease: Secondary | ICD-10-CM | POA: Diagnosis not present

## 2018-06-08 DIAGNOSIS — I10 Essential (primary) hypertension: Secondary | ICD-10-CM | POA: Diagnosis present

## 2018-06-08 DIAGNOSIS — I63511 Cerebral infarction due to unspecified occlusion or stenosis of right middle cerebral artery: Principal | ICD-10-CM | POA: Diagnosis present

## 2018-06-08 DIAGNOSIS — Z9989 Dependence on other enabling machines and devices: Secondary | ICD-10-CM | POA: Diagnosis not present

## 2018-06-08 DIAGNOSIS — J449 Chronic obstructive pulmonary disease, unspecified: Secondary | ICD-10-CM | POA: Diagnosis not present

## 2018-06-08 DIAGNOSIS — E1165 Type 2 diabetes mellitus with hyperglycemia: Secondary | ICD-10-CM | POA: Diagnosis not present

## 2018-06-08 DIAGNOSIS — E875 Hyperkalemia: Secondary | ICD-10-CM | POA: Diagnosis not present

## 2018-06-08 DIAGNOSIS — I272 Pulmonary hypertension, unspecified: Secondary | ICD-10-CM | POA: Diagnosis not present

## 2018-06-08 DIAGNOSIS — I63231 Cerebral infarction due to unspecified occlusion or stenosis of right carotid arteries: Secondary | ICD-10-CM | POA: Diagnosis not present

## 2018-06-08 DIAGNOSIS — C20 Malignant neoplasm of rectum: Secondary | ICD-10-CM | POA: Diagnosis present

## 2018-06-08 DIAGNOSIS — I5022 Chronic systolic (congestive) heart failure: Secondary | ICD-10-CM | POA: Diagnosis not present

## 2018-06-08 DIAGNOSIS — R269 Unspecified abnormalities of gait and mobility: Secondary | ICD-10-CM | POA: Diagnosis not present

## 2018-06-08 DIAGNOSIS — R4182 Altered mental status, unspecified: Secondary | ICD-10-CM | POA: Diagnosis present

## 2018-06-08 DIAGNOSIS — Z6823 Body mass index (BMI) 23.0-23.9, adult: Secondary | ICD-10-CM

## 2018-06-08 DIAGNOSIS — E8889 Other specified metabolic disorders: Secondary | ICD-10-CM | POA: Diagnosis present

## 2018-06-08 DIAGNOSIS — Z992 Dependence on renal dialysis: Secondary | ICD-10-CM | POA: Diagnosis not present

## 2018-06-08 DIAGNOSIS — Z823 Family history of stroke: Secondary | ICD-10-CM

## 2018-06-08 DIAGNOSIS — Z4682 Encounter for fitting and adjustment of non-vascular catheter: Secondary | ICD-10-CM | POA: Diagnosis not present

## 2018-06-08 DIAGNOSIS — I63411 Cerebral infarction due to embolism of right middle cerebral artery: Secondary | ICD-10-CM | POA: Diagnosis not present

## 2018-06-08 DIAGNOSIS — Z8249 Family history of ischemic heart disease and other diseases of the circulatory system: Secondary | ICD-10-CM | POA: Diagnosis not present

## 2018-06-08 DIAGNOSIS — I679 Cerebrovascular disease, unspecified: Secondary | ICD-10-CM | POA: Diagnosis not present

## 2018-06-08 DIAGNOSIS — I69354 Hemiplegia and hemiparesis following cerebral infarction affecting left non-dominant side: Secondary | ICD-10-CM | POA: Diagnosis not present

## 2018-06-08 DIAGNOSIS — R29818 Other symptoms and signs involving the nervous system: Secondary | ICD-10-CM | POA: Diagnosis not present

## 2018-06-08 DIAGNOSIS — I6931 Attention and concentration deficit following cerebral infarction: Secondary | ICD-10-CM | POA: Diagnosis not present

## 2018-06-08 DIAGNOSIS — I6601 Occlusion and stenosis of right middle cerebral artery: Secondary | ICD-10-CM | POA: Diagnosis present

## 2018-06-08 DIAGNOSIS — I509 Heart failure, unspecified: Secondary | ICD-10-CM | POA: Diagnosis not present

## 2018-06-08 DIAGNOSIS — I120 Hypertensive chronic kidney disease with stage 5 chronic kidney disease or end stage renal disease: Secondary | ICD-10-CM | POA: Diagnosis not present

## 2018-06-08 DIAGNOSIS — R339 Retention of urine, unspecified: Secondary | ICD-10-CM | POA: Diagnosis not present

## 2018-06-08 DIAGNOSIS — I69322 Dysarthria following cerebral infarction: Secondary | ICD-10-CM | POA: Diagnosis not present

## 2018-06-08 DIAGNOSIS — N185 Chronic kidney disease, stage 5: Secondary | ICD-10-CM | POA: Diagnosis not present

## 2018-06-08 DIAGNOSIS — Z743 Need for continuous supervision: Secondary | ICD-10-CM | POA: Diagnosis not present

## 2018-06-08 DIAGNOSIS — M898X9 Other specified disorders of bone, unspecified site: Secondary | ICD-10-CM | POA: Diagnosis not present

## 2018-06-08 DIAGNOSIS — E441 Mild protein-calorie malnutrition: Secondary | ICD-10-CM

## 2018-06-08 DIAGNOSIS — I420 Dilated cardiomyopathy: Secondary | ICD-10-CM | POA: Diagnosis present

## 2018-06-08 DIAGNOSIS — I5020 Unspecified systolic (congestive) heart failure: Secondary | ICD-10-CM | POA: Diagnosis not present

## 2018-06-08 DIAGNOSIS — N184 Chronic kidney disease, stage 4 (severe): Secondary | ICD-10-CM | POA: Diagnosis not present

## 2018-06-08 DIAGNOSIS — Z66 Do not resuscitate: Secondary | ICD-10-CM | POA: Diagnosis present

## 2018-06-08 HISTORY — PX: IR INTRAVSC STENT CERV CAROTID W/O EMB-PROT MOD SED INC ANGIO: IMG2304

## 2018-06-08 HISTORY — PX: RADIOLOGY WITH ANESTHESIA: SHX6223

## 2018-06-08 HISTORY — PX: IR ANGIO VERTEBRAL SEL SUBCLAVIAN INNOMINATE BILAT MOD SED: IMG5366

## 2018-06-08 HISTORY — PX: IR PERCUTANEOUS ART THROMBECTOMY/INFUSION INTRACRANIAL INC DIAG ANGIO: IMG6087

## 2018-06-08 HISTORY — PX: IR CT HEAD LTD: IMG2386

## 2018-06-08 LAB — COMPREHENSIVE METABOLIC PANEL
ALBUMIN: 4 g/dL (ref 3.5–5.0)
ALT: 15 U/L (ref 0–44)
AST: 18 U/L (ref 15–41)
Alkaline Phosphatase: 87 U/L (ref 38–126)
Anion gap: 14 (ref 5–15)
BUN: 63 mg/dL — ABNORMAL HIGH (ref 8–23)
CO2: 20 mmol/L — ABNORMAL LOW (ref 22–32)
Calcium: 10.5 mg/dL — ABNORMAL HIGH (ref 8.9–10.3)
Chloride: 103 mmol/L (ref 98–111)
Creatinine, Ser: 4.95 mg/dL — ABNORMAL HIGH (ref 0.61–1.24)
GFR calc Af Amer: 13 mL/min — ABNORMAL LOW (ref >60)
GFR calc non Af Amer: 11 mL/min — ABNORMAL LOW (ref >60)
Glucose, Bld: 116 mg/dL — ABNORMAL HIGH (ref 70–99)
Potassium: 3.4 mmol/L — ABNORMAL LOW (ref 3.5–5.1)
Sodium: 137 mmol/L (ref 135–145)
Total Bilirubin: 0.9 mg/dL (ref 0.3–1.2)
Total Protein: 6.7 g/dL (ref 6.5–8.1)

## 2018-06-08 LAB — POCT I-STAT 3, ART BLOOD GAS (G3+)
Acid-base deficit: 4 mmol/L — ABNORMAL HIGH (ref 0.0–2.0)
BICARBONATE: 20.5 mmol/L (ref 20.0–28.0)
O2 Saturation: 98 %
TCO2: 22 mmol/L (ref 22–32)
pCO2 arterial: 34.6 mmHg (ref 32.0–48.0)
pH, Arterial: 7.38 (ref 7.350–7.450)
pO2, Arterial: 105 mmHg (ref 83.0–108.0)

## 2018-06-08 LAB — I-STAT CHEM 8, ED
BUN: 54 mg/dL — AB (ref 8–23)
Calcium, Ion: 1.23 mmol/L (ref 1.15–1.40)
Chloride: 107 mmol/L (ref 98–111)
Creatinine, Ser: 5.1 mg/dL — ABNORMAL HIGH (ref 0.61–1.24)
GLUCOSE: 114 mg/dL — AB (ref 70–99)
HCT: 42 % (ref 39.0–52.0)
Hemoglobin: 14.3 g/dL (ref 13.0–17.0)
Potassium: 3.4 mmol/L — ABNORMAL LOW (ref 3.5–5.1)
Sodium: 138 mmol/L (ref 135–145)
TCO2: 23 mmol/L (ref 22–32)

## 2018-06-08 LAB — I-STAT TROPONIN, ED: Troponin i, poc: 0.02 ng/mL (ref 0.00–0.08)

## 2018-06-08 LAB — CBC
HCT: 39.9 % (ref 39.0–52.0)
Hemoglobin: 12.7 g/dL — ABNORMAL LOW (ref 13.0–17.0)
MCH: 28 pg (ref 26.0–34.0)
MCHC: 31.8 g/dL (ref 30.0–36.0)
MCV: 87.9 fL (ref 80.0–100.0)
PLATELETS: 153 10*3/uL (ref 150–400)
RBC: 4.54 MIL/uL (ref 4.22–5.81)
RDW: 18.1 % — ABNORMAL HIGH (ref 11.5–15.5)
WBC: 8.3 10*3/uL (ref 4.0–10.5)
nRBC: 0 % (ref 0.0–0.2)

## 2018-06-08 LAB — DIFFERENTIAL
ABS IMMATURE GRANULOCYTES: 0.04 10*3/uL (ref 0.00–0.07)
Basophils Absolute: 0 10*3/uL (ref 0.0–0.1)
Basophils Relative: 0 %
Eosinophils Absolute: 0.1 10*3/uL (ref 0.0–0.5)
Eosinophils Relative: 1 %
Immature Granulocytes: 1 %
LYMPHS ABS: 0.6 10*3/uL — AB (ref 0.7–4.0)
Lymphocytes Relative: 8 %
Monocytes Absolute: 0.7 10*3/uL (ref 0.1–1.0)
Monocytes Relative: 9 %
Neutro Abs: 6.8 10*3/uL (ref 1.7–7.7)
Neutrophils Relative %: 81 %

## 2018-06-08 LAB — BASIC METABOLIC PANEL
Anion gap: 13 (ref 5–15)
BUN: 58 mg/dL — AB (ref 8–23)
CO2: 20 mmol/L — ABNORMAL LOW (ref 22–32)
Calcium: 9.8 mg/dL (ref 8.9–10.3)
Chloride: 101 mmol/L (ref 98–111)
Creatinine, Ser: 4.57 mg/dL — ABNORMAL HIGH (ref 0.61–1.24)
GFR calc Af Amer: 14 mL/min — ABNORMAL LOW (ref >60)
GFR calc non Af Amer: 12 mL/min — ABNORMAL LOW (ref >60)
Glucose, Bld: 193 mg/dL — ABNORMAL HIGH (ref 70–99)
POTASSIUM: 3.9 mmol/L (ref 3.5–5.1)
Sodium: 134 mmol/L — ABNORMAL LOW (ref 135–145)

## 2018-06-08 LAB — APTT: APTT: 29 s (ref 24–36)

## 2018-06-08 LAB — PROTIME-INR
INR: 1.06
Prothrombin Time: 13.7 seconds (ref 11.4–15.2)

## 2018-06-08 LAB — ETHANOL: Alcohol, Ethyl (B): <10 mg/dL (ref <10)

## 2018-06-08 LAB — TRIGLYCERIDES: Triglycerides: 144 mg/dL (ref <150)

## 2018-06-08 LAB — CBG MONITORING, ED: Glucose-Capillary: 102 mg/dL — ABNORMAL HIGH (ref 70–99)

## 2018-06-08 LAB — MRSA PCR SCREENING: MRSA by PCR: NEGATIVE

## 2018-06-08 SURGERY — IR WITH ANESTHESIA
Anesthesia: Choice

## 2018-06-08 MED ORDER — FENTANYL CITRATE (PF) 100 MCG/2ML IJ SOLN
INTRAMUSCULAR | Status: AC
  Filled 2018-06-08: qty 2

## 2018-06-08 MED ORDER — TICAGRELOR 60 MG PO TABS
ORAL_TABLET | ORAL | Status: AC | PRN
  Administered 2018-06-08: 180 mg via NASOGASTRIC

## 2018-06-08 MED ORDER — ACETAMINOPHEN 325 MG PO TABS: 650.0000 mg | ORAL_TABLET | ORAL | Status: DC | PRN

## 2018-06-08 MED ORDER — SODIUM CHLORIDE 0.9 % IV SOLN: INTRAVENOUS | Status: DC

## 2018-06-08 MED ORDER — LIDOCAINE 2% (20 MG/ML) 5 ML SYRINGE
INTRAMUSCULAR | Status: DC | PRN
  Administered 2018-06-08: 80 mg via INTRAVENOUS

## 2018-06-08 MED ORDER — IOPAMIDOL (ISOVUE-370) INJECTION 76%
100.0000 mL | Freq: Once | INTRAVENOUS | Status: AC | PRN
  Administered 2018-06-08: 100 mL via INTRAVENOUS

## 2018-06-08 MED ORDER — ACETAMINOPHEN 650 MG RE SUPP: 650.0000 mg | RECTAL | Status: DC | PRN

## 2018-06-08 MED ORDER — PANTOPRAZOLE SODIUM 40 MG IV SOLR
40.0000 mg | Freq: Every day | INTRAVENOUS | Status: DC
  Administered 2018-06-08 – 2018-06-10 (×3): 40 mg via INTRAVENOUS
  Filled 2018-06-08 (×3): qty 40

## 2018-06-08 MED ORDER — CEFAZOLIN SODIUM-DEXTROSE 2-4 GM/100ML-% IV SOLN
INTRAVENOUS | Status: AC
  Filled 2018-06-08: qty 100

## 2018-06-08 MED ORDER — IOHEXOL 300 MG/ML  SOLN
100.0000 mL | Freq: Once | INTRAMUSCULAR | Status: AC | PRN
  Administered 2018-06-08: 100 mL via INTRA_ARTERIAL

## 2018-06-08 MED ORDER — CLOPIDOGREL BISULFATE 300 MG PO TABS
ORAL_TABLET | ORAL | Status: AC
  Filled 2018-06-08: qty 1

## 2018-06-08 MED ORDER — CLEVIDIPINE BUTYRATE 0.5 MG/ML IV EMUL
INTRAVENOUS | Status: DC | PRN
  Administered 2018-06-08: 4 mg/h via INTRAVENOUS

## 2018-06-08 MED ORDER — FENTANYL CITRATE (PF) 250 MCG/5ML IJ SOLN
INTRAMUSCULAR | Status: DC | PRN
  Administered 2018-06-08: 100 ug via INTRAVENOUS

## 2018-06-08 MED ORDER — SODIUM CHLORIDE 0.9 % IV SOLN
INTRAVENOUS | Status: DC | PRN
  Administered 2018-06-08: 20 ug/min via INTRAVENOUS

## 2018-06-08 MED ORDER — SENNOSIDES-DOCUSATE SODIUM 8.6-50 MG PO TABS: 1.0000 | ORAL_TABLET | Freq: Every evening | ORAL | Status: DC | PRN

## 2018-06-08 MED ORDER — LIDOCAINE HCL 1 % IJ SOLN
INTRAMUSCULAR | Status: AC
  Filled 2018-06-08: qty 20

## 2018-06-08 MED ORDER — TIROFIBAN HCL IN NACL 5-0.9 MG/100ML-% IV SOLN
INTRAVENOUS | Status: AC
  Filled 2018-06-08: qty 100

## 2018-06-08 MED ORDER — METOPROLOL TARTRATE 25 MG/10 ML ORAL SUSPENSION
25.0000 mg | Freq: Two times a day (BID) | ORAL | Status: DC
  Administered 2018-06-08 – 2018-06-10 (×4): 25 mg via ORAL
  Filled 2018-06-08 (×4): qty 10

## 2018-06-08 MED ORDER — PROPOFOL 10 MG/ML IV BOLUS
INTRAVENOUS | Status: DC | PRN
  Administered 2018-06-08 (×2): 50 mg via INTRAVENOUS
  Administered 2018-06-08: 40 mg via INTRAVENOUS

## 2018-06-08 MED ORDER — ASPIRIN 81 MG PO CHEW
CHEWABLE_TABLET | ORAL | Status: AC | PRN
  Administered 2018-06-08: 81 mg via NASOGASTRIC

## 2018-06-08 MED ORDER — ACETAMINOPHEN 325 MG PO TABS
650.0000 mg | ORAL_TABLET | ORAL | Status: DC | PRN
  Administered 2018-06-16: 650 mg via ORAL
  Filled 2018-06-08: qty 2

## 2018-06-08 MED ORDER — EPTIFIBATIDE 20 MG/10ML IV SOLN
INTRAVENOUS | Status: AC
  Filled 2018-06-08: qty 10

## 2018-06-08 MED ORDER — PROPOFOL 1000 MG/100ML IV EMUL
0.0000 ug/kg/min | INTRAVENOUS | Status: DC
  Administered 2018-06-08: 35 ug/kg/min via INTRAVENOUS
  Administered 2018-06-09: 10 ug/kg/min via INTRAVENOUS
  Administered 2018-06-09: 35 ug/kg/min via INTRAVENOUS
  Filled 2018-06-08 (×2): qty 100

## 2018-06-08 MED ORDER — CEFAZOLIN SODIUM-DEXTROSE 2-3 GM-%(50ML) IV SOLR
INTRAVENOUS | Status: DC | PRN
  Administered 2018-06-08: 2 g via INTRAVENOUS

## 2018-06-08 MED ORDER — ASPIRIN 81 MG PO CHEW
81.0000 mg | CHEWABLE_TABLET | Freq: Every day | ORAL | Status: DC
  Administered 2018-06-10 – 2018-06-16 (×7): 81 mg via ORAL
  Filled 2018-06-08 (×7): qty 1

## 2018-06-08 MED ORDER — ROCURONIUM BROMIDE 10 MG/ML (PF) SYRINGE
PREFILLED_SYRINGE | INTRAVENOUS | Status: DC | PRN
  Administered 2018-06-08: 50 mg via INTRAVENOUS
  Administered 2018-06-08: 20 mg via INTRAVENOUS
  Administered 2018-06-08 (×3): 10 mg via INTRAVENOUS

## 2018-06-08 MED ORDER — TICAGRELOR 90 MG PO TABS
90.0000 mg | ORAL_TABLET | Freq: Two times a day (BID) | ORAL | Status: DC
  Administered 2018-06-09 – 2018-06-16 (×14): 90 mg via ORAL
  Filled 2018-06-08 (×13): qty 1

## 2018-06-08 MED ORDER — CLEVIDIPINE BUTYRATE 0.5 MG/ML IV EMUL
0.0000 mg/h | INTRAVENOUS | Status: AC
  Administered 2018-06-08: 8 mg/h via INTRAVENOUS
  Administered 2018-06-08: 21 mg/h via INTRAVENOUS
  Administered 2018-06-08: 12 mg/h via INTRAVENOUS
  Filled 2018-06-08: qty 100

## 2018-06-08 MED ORDER — NITROGLYCERIN 1 MG/10 ML FOR IR/CATH LAB
INTRA_ARTERIAL | Status: AC
  Filled 2018-06-08: qty 10

## 2018-06-08 MED ORDER — EPTIFIBATIDE 20 MG/10ML IV SOLN
INTRAVENOUS | Status: AC | PRN
  Administered 2018-06-08 (×5): 1.5 mg

## 2018-06-08 MED ORDER — ASPIRIN 81 MG PO CHEW
81.0000 mg | CHEWABLE_TABLET | Freq: Every day | ORAL | Status: DC
  Administered 2018-06-09: 81 mg
  Filled 2018-06-08: qty 1

## 2018-06-08 MED ORDER — ASPIRIN 81 MG PO CHEW
CHEWABLE_TABLET | ORAL | Status: AC
  Filled 2018-06-08: qty 1

## 2018-06-08 MED ORDER — ACETAMINOPHEN 160 MG/5ML PO SOLN: 650.0000 mg | ORAL | Status: DC | PRN

## 2018-06-08 MED ORDER — TICAGRELOR 90 MG PO TABS
90.0000 mg | ORAL_TABLET | Freq: Two times a day (BID) | ORAL | Status: DC
  Administered 2018-06-09: 90 mg
  Filled 2018-06-08: qty 1

## 2018-06-08 MED ORDER — ORAL CARE MOUTH RINSE
15.0000 mL | OROMUCOSAL | Status: DC
  Administered 2018-06-09 (×5): 15 mL via OROMUCOSAL

## 2018-06-08 MED ORDER — GLYCOPYRROLATE 0.2 MG/ML IJ SOLN
INTRAMUSCULAR | Status: DC | PRN
  Administered 2018-06-08: 0.1 mg via INTRAVENOUS

## 2018-06-08 MED ORDER — LACTATED RINGERS IV SOLN
INTRAVENOUS | Status: DC | PRN
  Administered 2018-06-08: 16:00:00 via INTRAVENOUS

## 2018-06-08 MED ORDER — LABETALOL HCL 5 MG/ML IV SOLN
INTRAVENOUS | Status: DC | PRN
  Administered 2018-06-08 (×2): 10 mg via INTRAVENOUS

## 2018-06-08 MED ORDER — NITROGLYCERIN 1 MG/10 ML FOR IR/CATH LAB
INTRA_ARTERIAL | Status: AC | PRN
  Administered 2018-06-08 (×4): 25 ug via INTRA_ARTERIAL

## 2018-06-08 MED ORDER — FENTANYL CITRATE (PF) 100 MCG/2ML IJ SOLN
50.0000 ug | INTRAMUSCULAR | Status: DC | PRN
  Filled 2018-06-08: qty 2

## 2018-06-08 MED ORDER — PHENYLEPHRINE 40 MCG/ML (10ML) SYRINGE FOR IV PUSH (FOR BLOOD PRESSURE SUPPORT)
PREFILLED_SYRINGE | INTRAVENOUS | Status: DC | PRN
  Administered 2018-06-08 (×2): 80 ug via INTRAVENOUS

## 2018-06-08 MED ORDER — CHLORHEXIDINE GLUCONATE 0.12% ORAL RINSE (MEDLINE KIT)
15.0000 mL | Freq: Two times a day (BID) | OROMUCOSAL | Status: DC
  Administered 2018-06-08 – 2018-06-09 (×2): 15 mL via OROMUCOSAL

## 2018-06-08 MED ORDER — FENTANYL CITRATE (PF) 100 MCG/2ML IJ SOLN: 50.0000 ug | INTRAMUSCULAR | Status: DC | PRN

## 2018-06-08 MED ORDER — STROKE: EARLY STAGES OF RECOVERY BOOK
Freq: Once | Status: DC
  Filled 2018-06-08 (×2): qty 1

## 2018-06-08 MED ORDER — CLEVIDIPINE BUTYRATE 0.5 MG/ML IV EMUL
INTRAVENOUS | Status: AC
  Filled 2018-06-08: qty 50

## 2018-06-08 MED ORDER — SODIUM CHLORIDE 0.9 % IV SOLN
INTRAVENOUS | Status: DC
  Administered 2018-06-08: 21:00:00 via INTRAVENOUS

## 2018-06-08 MED ORDER — TICAGRELOR 90 MG PO TABS
ORAL_TABLET | ORAL | Status: AC
  Filled 2018-06-08: qty 2

## 2018-06-08 NOTE — H&P (Signed)
Stroke H&P  CC: Right gaze preference, flaccid left arm and leg, slurred speech  History is obtained from: Chart, patient's wife, EMS  HPI: Jason DEPAUL Sr. is a 72 y.o. male past medical history of prediabetes, CKD, hypertension, obstructive sleep apnea, rectal cancer status post chemotherapy, normally alert awake oriented x4 at baseline and functional independent, last seen normal around 5:30 AM and his wife left for work, and was found down in the bathroom unable to move the left side by EMS. His wife attempted to reach him and not reaching him, had EMS check on him.  He was in the bathroom, incontinent of urine, slumped over the top.  He was unable to move the left side of his body.  His speech was very slurred.  He had a lot of secretions in mouth that needed to be cleared.  Upon putting him on the stretcher, the patient was able to talk although with a lot of dysarthria.  He was able to follow commands.  A code stroke was called he was brought into the emergency room for an LVO positive stroke. In the emergency room, he was examined in the bridge.  He was able to provide reasonable history but not in terms of time of last known normal onset. His wife made it to the hospital right after him.  She confirmed the last known normal 5:30 AM. Outside the window for IV TPA Based on CT, CT and CT perfusion imaging- candidate for IR which was offered and family consented to proceed.  Currently in IR.  LKW: 0530 hrs. on 06/08/2018 tpa given?: no, outside the window Premorbid modified Rankin scale (mRS):1  ROS: ROS was performed and is negative except as noted in the HPI.   Past Medical History:  Diagnosis Date  . Anemia   . Arthritis   . Chronic renal insufficiency 07/31/2011  . COPD (chronic obstructive pulmonary disease) ([place])    pt denies  . Deficiency anemia 07/20/2012  . Diverticulosis   . Edema 09/21/2010   Qualifier: Diagnosis of  By: Jerold Coombe    . ERECTILE DYSFUNCTION, MILD  05/11/2007   Qualifier: Diagnosis of  By: Sherren Mocha MD, Jory Ee   . Exertional dyspnea 10/30/2015  . GOUT 05/11/2007   Qualifier: Diagnosis of  By: Sherren Mocha MD, Jory Ee   . Hereditary and idiopathic peripheral neuropathy 09/14/2014  . History of radiation therapy 02/21/13-03/31/13   rectum 50.4Gy total dose  . Hypersomnia 10/30/2015  . Hypertension   . Lung nodule 10/30/2015  . OAB (overactive bladder) 01/09/2016  . Obesity (BMI 30.0-34.9) 10/30/2015  . Obstructive sleep apnea 07/29/2010   HST 11/2015 AHI 54.  On autocpap  >> 10 cm   . Peripheral edema 09/19/2013  . rectal ca dx'd 02/01/13   rectal. Radiation and chemotherapy- remains on chemotherapy-next tx. 08-22-13.   . S/P partial lobectomy of lung 11/20/2015  . S/P thoracotomy   . SUI (stress urinary incontinence), male 01/09/2016    Family History  Problem Relation Age of Onset  . Stroke Mother   . Hypertension Mother   . Stroke Father   . Hypertension Father   . Hypertension Other   . Colon cancer Neg Hx    Social History:   reports that he quit smoking about 40 years ago. His smoking use included cigarettes. He has a 5.00 pack-year smoking history. He has never used smokeless tobacco. He reports that he drinks alcohol. He reports that he does not use drugs.  Medications  Current Facility-Administered Medications:  .   stroke: mapping our early stages of recovery book, , Does not apply, Once, Amie Portland, MD .  0.9 %  sodium chloride infusion, , Intravenous, Continuous, Amie Portland, MD .  acetaminophen (TYLENOL) tablet 650 mg, 650 mg, Oral, Q4H PRN **OR** acetaminophen (TYLENOL) solution 650 mg, 650 mg, Per Tube, Q4H PRN **OR** acetaminophen (TYLENOL) suppository 650 mg, 650 mg, Rectal, Q4H PRN, Amie Portland, MD .  aspirin 81 MG chewable tablet, , , ,  .  clopidogrel (PLAVIX) 300 MG tablet, , , ,  .  eptifibatide (INTEGRILIN) 20 MG/10ML injection, , , ,  .  fentaNYL (SUBLIMAZE) 100 MCG/2ML injection, , , ,  .  lidocaine (XYLOCAINE) 1  % (with pres) injection, , , ,  .  nitroGLYCERIN 100 mcg/mL intra-arterial injection, , , ,  .  senna-docusate (Senokot-S) tablet 1 tablet, 1 tablet, Oral, QHS PRN, Amie Portland, MD .  ticagrelor (BRILINTA) 90 MG tablet, , , ,  .  tirofiban (AGGRASTAT) 5-0.9 MG/100ML-% injection, , , ,   Current Outpatient Medications:  .  acetaminophen (TYLENOL) 325 MG tablet, Take 650 mg by mouth every 6 (six) hours as needed., Disp: , Rfl:  .  allopurinol (ZYLOPRIM) 100 MG tablet, Take 1 tablet (100 mg total) by mouth daily., Disp: 30 tablet, Rfl: 0 .  calcium carbonate (OSCAL) 1500 (600 Ca) MG TABS tablet, Take 600 mg of elemental calcium by mouth daily., Disp: , Rfl:  .  cholecalciferol (VITAMIN D) 1000 units tablet, Take 5,000 Units by mouth daily., Disp: , Rfl:  .  doxazosin (CARDURA) 8 MG tablet, Take 1 tablet (8 mg total) by mouth daily., Disp: 90 tablet, Rfl: 0 .  furosemide (LASIX) 80 MG tablet, Take 1 tablet (80 mg total) by mouth daily., Disp: , Rfl:  .  gabapentin (NEURONTIN) 300 MG capsule, Take 1 capsule (300 mg total) by mouth at bedtime. Take 300mg s morning and 600mg s at bedtime, Disp: , Rfl:  .  KLOR-CON M20 20 MEQ tablet, Take 1 tablet (20 mEq total) by mouth daily., Disp: 90 tablet, Rfl: 1 .  metoprolol succinate (TOPROL-XL) 50 MG 24 hr tablet, Take 50 mg by mouth daily., Disp: , Rfl:  .  polycarbophil (FIBERCON) 625 MG tablet, Take 1,250 mg by mouth as needed. , Disp: , Rfl:   Exam: Current vital signs: BP (!) 145/74   Pulse 88  Vital signs in last 24 hours: Pulse Rate:  [88] 88 (12/03 1530) BP: (145)/(74) 145/74 (12/03 1530) GENERAL: Awake, alert in NAD HEENT: - Normocephalic and atraumatic, dry mm, no LN++, no Thyromegally LUNGS - Clear to auscultation bilaterally with no wheezes CV - S1S2 RRR, no m/r/g, equal pulses bilaterally. ABDOMEN - Soft, nontender, nondistended with normoactive BS Ext: warm, well perfused, intact peripheral pulses, no edema  NEURO:  Mental Status:  AA&Ox3  Language: speech is severely dysarthric.  Naming, repetition, fluency, and comprehension intact. Cranial Nerves: PERRL, EOM time shows forced gaze deviation to the right with inability to even get to midline,, visual fields show left homonymous hemianopsia, left facial weakness, facial sensation intact, hearing intact, tongue/uvula/soft palate midline, normal sternocleidomastoid and trapezius muscle strength. No evidence of tongue atrophy or fibrillations Motor: Flaccid left upper extremity.  Flaccid left lower extremity.  5/5 right upper extremity.  5/5 right lower extremity. Tone: is normal and bulk is normal Sensation-markedly diminished on the left hemibody with neglect. Coordination: FTN intact on the right Gait- deferred NIHSS=18  Labs I have reviewed  labs in epic and the results pertinent to this consultation are: CBC    Component Value Date/Time   WBC 8.3 06/08/2018 1514   RBC 4.54 06/08/2018 1514   HGB 14.3 06/08/2018 1526   HGB 10.9 (L) 03/17/2016 1546   HCT 42.0 06/08/2018 1526   HCT 35.4 (L) 03/17/2016 1546   PLT 153 06/08/2018 1514   PLT 209 03/17/2016 1546   MCV 87.9 06/08/2018 1514   MCV 87.9 03/17/2016 1546   MCH 28.0 06/08/2018 1514   MCHC 31.8 06/08/2018 1514   RDW 18.1 (H) 06/08/2018 1514   RDW 18.5 (H) 03/17/2016 1546   LYMPHSABS 0.6 (L) 06/08/2018 1514   LYMPHSABS 1.2 03/17/2016 1546   MONOABS 0.7 06/08/2018 1514   MONOABS 1.2 (H) 03/17/2016 1546   EOSABS 0.1 06/08/2018 1514   EOSABS 0.1 03/17/2016 1546   BASOSABS 0.0 06/08/2018 1514   BASOSABS 0.1 03/17/2016 1546   CMP     Component Value Date/Time   NA 138 06/08/2018 1526   NA 140 02/24/2017   NA 138 02/20/2014 1432   K 3.4 (L) 06/08/2018 1526   K 3.5 02/20/2014 1432   CL 107 06/08/2018 1526   CL 101 12/09/2012 1048   CO2 20 (L) 06/08/2018 1514   CO2 30 (H) 02/20/2014 1432   GLUCOSE 114 (H) 06/08/2018 1526   GLUCOSE 93 02/20/2014 1432   GLUCOSE 96 12/09/2012 1048   BUN 54 (H)  06/08/2018 1526   BUN 50 (A) 08/10/2017   BUN 36.0 (H) 02/20/2014 1432   CREATININE 5.10 (H) 06/08/2018 1526   CREATININE 2.4 (H) 02/20/2014 1432   CALCIUM 10.5 (H) 06/08/2018 1514   CALCIUM 10.4 02/20/2014 1432   PROT 6.7 06/08/2018 1514   PROT 7.2 02/20/2014 1432   ALBUMIN 4.0 06/08/2018 1514   ALBUMIN 3.8 02/20/2014 1432   AST 18 06/08/2018 1514   AST 22 02/20/2014 1432   ALT 15 06/08/2018 1514   ALT 12 02/20/2014 1432   ALKPHOS 87 06/08/2018 1514   ALKPHOS 86 02/20/2014 1432   BILITOT 0.9 06/08/2018 1514   BILITOT 0.32 02/20/2014 1432   GFRNONAA 11 (L) 06/08/2018 1514   GFRAA 13 (L) 06/08/2018 1514   Lipid Panel     Component Value Date/Time   CHOL 156 09/23/2017 1604   TRIG 98.0 09/23/2017 1604   TRIG 104 05/11/2006 1014   HDL 50.90 09/23/2017 1604   CHOLHDL 3 09/23/2017 1604   VLDL 19.6 09/23/2017 1604   LDLCALC 85 09/23/2017 1604   LDLDIRECT 120.0 06/04/2015 1015    Imaging I have reviewed the images obtained:  CT-scan of the brain-no bleed, dense right M1, aspects 9. CT angios of head and neck showed occluded right ICA, right ICA terminus and right MCA with CT perfusion core infarct of 13 cc and mismatch volume of 119 cc with a mismatch ratio of 10.2.  Further review revealed occluded right vertebral artery and soft plaque at the left vertebral origin.  Superimposed intracranial atherosclerosis including moderate to severe stenosis of the left ICA supraclinoid segment, left vertebral artery V4 segment and right PCA P1 and P2 segments.  Assessment:  72 year old man with above set past medical history presenting with dense left hemiplegia, forced right gaze deviation, severe dysarthria and left-sided facial weakness along with left-sided hemisensory loss and extinction consistent with a right cerebral hemispheric stroke. Vessel imaging revealed right ICA, right ICA terminus and right MCA occlusion along with occluded right vertebral artery and soft plaque at the left  vertebral  origin. Likely sequela of a cardioembolic process. Outside window for IV TPA hence TPA not given. Favorable perfusion profile and CT.  Discussed with family the options for endovascular revascularization and wife agreed. Currently in IR.  Plan:  Acute Ischemic Stroke Cerebral infarction due to embolism of right middle cerebral artery Occlusion and stenosis of R carotid artery Occlusion of the right vertebral artery  Acuity: Acute Current Suspected Etiology: Cardioembolic Continue Evaluation:  -Admit to: Neurological ICU -Aspirin recommendations will be based after intervention.  Might require stenting and dual antiplatelets.  Will await completion of the endovascular revascularization. -Blood pressure control, goal per neuro IR-patient still in the IR suite undergoing thrombectomy. -MRI/ECHO/A1C/Lipid panel. -Hyperglycemia management per SSI to maintain glucose 140-180mg /dL. -PT/OT/ST therapies and recommendations when able  CNS -Close neuro monitoring  Dysarthria Dysphagia following cerebral infarction  -NPO until cleared by speech -ST -Advance diet as tolerated -May need PEG  Hemiplegia and hemiparesis following cerebral infarction affecting left non-dominant side  -PT/OT -PM&R consult  RESP Possible aspiration pneumonia due to dysphagia and dysarthria Chest x-ray Hold antibiotics for now until test results become available Possibly require intubation for IR procedure. Further management per PCCM if remains intubated after procedure.  CV Essential (primary) hypertension -Aggressive BP control based on the IR procedure.  If revascularization is achieved, goal systolic blood pressure will be below 140.  If revascularization is not achieved, will need to allow for permissive hypertension. -Avoid oral agents and will utilize Cardene and labetalol as needed.  Heart failure, unspecified Acute systolic (congestive) heart failure  Chronic systolic (congestive)  heart failure  Acute on chronic systolic (congestive) heart failure  -TTE -Continue BB -Cards Consult  Hyperlipidemia, unspecified  - Statin for goal LDL < 70  HEME -transfuse for hgb < 7  ENDO -goal HgbA1c < 7  GI/GU CKD Stage 5 (GFR < 15) - at baseline from last lab -Gentle hydration -avoid nephrotoxic agents - consider renal consult  Fluid/Electrolyte Disorders -Repeat labs -Treat as necessary  ID Possible Aspiration PNA -CXR -NPO -Monitor  Nutrition Estimated body mass index is 29.27 kg/m as calculated from the following:   Height as of 05/17/18: 5\' 8"  (1.727 m).   Weight as of 05/17/18: 87.3 kg. -diet consult  Prophylaxis DVT:  scd for now GI: PPI Bowel:doc senna   Diet: NPO until cleared by speech  Code Status: Full Code  Comfort Measures DNR   THE FOLLOWING WERE PRESENT ON ADMISSION: Acute ischemic stroke, hemiplegia, aspiration pneumonia, rectal cancer, essential hypertension   -- Amie Portland, MD [facility] Pager: [phone] If 7pm to 7am, please call on call as listed on AMION.   CRITICAL CARE ATTESTATION Performed by: Amie Portland, MD Total critical care time: 60 minutes Critical care time was exclusive of separately billable procedures and treating other patients and/or supervising APPs/Residents/Students Critical care was necessary to treat or prevent imminent or life-threatening deterioration due to acute ischemic stroke This patient is critically ill and at significant risk for neurological worsening and/or death and care requires constant monitoring. Critical care was time spent personally by me on the following activities: development of treatment plan with patient and/or surrogate as well as nursing, discussions with consultants, evaluation of patient's response to treatment, examination of patient, obtaining history from patient or surrogate, ordering and performing treatments and interventions, ordering and review of  laboratory studies, ordering and review of radiographic studies, pulse oximetry, re-evaluation of patient's condition, participation in multidisciplinary rounds and medical decision making of high complexity in the  care of this patient.

## 2018-06-08 NOTE — ED Triage Notes (Signed)
Pt arrives to ED as a Code Stroke from home with complaints of right sided gaze and left sided deficits with last known normal at 0530. EMS reports pt was found face down in a tub and looked liked he feel forward from sitting on the toliet.

## 2018-06-08 NOTE — Progress Notes (Signed)
Patient ID: Jason [name] Sr., male   DOB: [birth year], 72 y.o.   MRN: [id number] INR. 72 Y RH M LSW 530 am.MRSS of 0. Found with RT gaze preference,and Lt sided hemiplegia. CT brain No ICH. ASPECTS 10. CTA occluded RT MCA ,RT ICA prox and ? RT ACA prox.  CTP Rapid. Core of 56ml and Tmax> 6.0s vol of 132 ml Mismatch of 121ml and Ratio of 10.2. Option of endovascular revascularization D/W spouse.  Procedure,benefits and altrernatives discussed. Risks of ICH of 10 %,worsening neur deficit ,vent dependency,death ,unable to revascularize and vascular injury all reviewed. Questions answered to spouses satisfaction.Informed witnessed consent obtained.for endovascular revascularization under [place]. S.Karmina Zufall MD

## 2018-06-08 NOTE — Sedation Documentation (Addendum)
Groin and pulses assessed with [place], Therapist, sports. See flowsheet. All questions answered.

## 2018-06-08 NOTE — Progress Notes (Signed)
Patient ID: Jason HATTABAUGH Sr., male   DOB: 1946-02-02, 72 y.o.   MRN: [id number] INR. Post DYNA CT brain no ICH ,mass effect or midline shift noted. RT groin soft after placement of an 8 F angioseal closure device. Distal pulses Both PTs dopplerable.DPs not palpable.Arlean Hopping MD

## 2018-06-08 NOTE — Code Documentation (Signed)
72 yo Male coming from home where he lives with his wife. Patient was normal this morning at 0530 when she left for work. Wife tried to call home and pt did not answer. When she got home, she found him face forward into the tub. Pt had been on the toilet and fell forward into the tub. EMS was called. EMS activated a Code Stroke in the field. Stroke Team met patient upon arrival. Initial NIHSS 18 due to forced right sided gaze, left arm and leg flaccid, decreased sensation of the left side, dysarthria, and neglect. CT Head completed - No hemorrhage. CTA/CTP showed evidence of right carotid occlusion. Patient taken to IR. Not a tPA candidate due to patient being outside the window. Report given to Whitney, RN. Pt to be admitted to [address].  

## 2018-06-08 NOTE — Sedation Documentation (Signed)
Jason Pandy, MD entered IR suite 2.

## 2018-06-08 NOTE — Transfer of Care (Signed)
Immediate Anesthesia Transfer of Care Note  Patient: Jason Cotta Sr.  Procedure(s) Performed: IR WITH ANESTHESIA (N/A )  Patient Location: PACU  Anesthesia Type:General  Level of Consciousness: sedated  Airway & Oxygen Therapy: Patient remains intubated per anesthesia plan and Patient placed on Ventilator (see vital sign flow sheet for setting)  Post-op Assessment: Report given to RN and Post -op Vital signs reviewed and stable  Post vital signs: Reviewed and stable  Last Vitals:  Vitals Value Taken Time  BP 168/92 06/08/2018  8:48 PM  Temp    Pulse 116 06/08/2018  8:49 PM  Resp 19 06/08/2018  8:49 PM  SpO2 100 % 06/08/2018  8:49 PM  Vitals shown include unvalidated device data.  Last Pain: There were no vitals filed for this visit.       Complications: No apparent anesthesia complications

## 2018-06-08 NOTE — Sedation Documentation (Signed)
Patient in IR suite 2 under care of CRNA. This RN is room to assist as needed.

## 2018-06-08 NOTE — Procedures (Signed)
S/P RT common carotid and bilateral vert artery angiograms. S/P comlete revascularization of acutely occluded Rt ICA peox with stent assisted angioplasty and of the RT MCA  And  RT ACA prox with x 2 passes with 13mm x 33 mm emboptrap , and x 1 pass with 51mm x 40 mm solitaire X retriever device achieving a TICI 2A  Revascularization ,and placement of a 16mm x 58mm atlas neuroform stent achieving a TICI 2b revascularization.Infusion of 6mg  of Integrelin superselectively for acute thrombosis of the stent  With subsequent recanalization  to a TICI 2b . Severe preocclusive stenosis of both VAs at their origins.

## 2018-06-08 NOTE — Consult Note (Addendum)
NAME:  Jason Lobo., MRN:  [id number], DOB:  1945/10/02, LOS: 0 ADMISSION DATE:  06/08/2018, CONSULTATION DATE:  12/3 REFERRING MD:  Dr. Rory Percy, CHIEF COMPLAINT:  CVA   Brief History   72 year old male with complicated medical history admitted 12/3 with acute CVA and was taken to IR for mechanical revascularization.  Postoperatively he remained on the ventilator.  History of present illness   Patient is encephalopathic and/or intubated. Therefore history has been obtained from chart review.  72 year old male with past medical history as below, which is significant for chronic kidney disease, CHF, obstructive sleep apnea, rectal cancer status post chemotherapy and radiation (2014).  He was last known well December 3 at about 5:30 AM when his wife left for work.  Later that day she was able to contact him and got home to find him with slurred speech and unable to move his left side.  He was able to follow commands.  He was evaluated by neurology immediately upon arrival to the emergency department with concern for CVA.  CT of the head was negative for acute hemorrhage.  He was outside the time window for TPA, but was able to be taken to IR based on CTA and CTP.  He underwent endovascular revascularization of the right ICA, MCA, and ACA.  Postoperatively he was transferred to the ICU on the ventilator machine.  PCCM was asked to assist with ventilator and blood pressure control.  Past Medical History   has a past medical history of Anemia, Arthritis, Chronic renal insufficiency (07/31/2011), COPD (chronic obstructive pulmonary disease) ([place]), Deficiency anemia (07/20/2012), Diverticulosis, Edema (09/21/2010), ERECTILE DYSFUNCTION, MILD (05/11/2007), Exertional dyspnea (10/30/2015), GOUT (05/11/2007), Hereditary and idiopathic peripheral neuropathy (09/14/2014), History of radiation therapy (02/21/13-03/31/13), Hypersomnia (10/30/2015), Hypertension, Lung nodule (10/30/2015), OAB (overactive bladder) (01/09/2016),  Obesity (BMI 30.0-34.9) (10/30/2015), Obstructive sleep apnea (07/29/2010), Peripheral edema (09/19/2013), rectal ca (dx'd 02/01/13), S/P partial lobectomy of lung (11/20/2015), S/P thoracotomy, and SUI (stress urinary incontinence), male (01/09/2016).   Significant Hospital Events   12/3 > admit with CVA, taken to IR for revascularization  Consults:    Procedures:  12/3: neuro IR for revascularization of acutely occluded Rt ICA peox with stent assisted angioplasty and of the RT MCA  And  RT ACA prox with x 2 passes with 52mm x 33 mm emboptrap , and x 1 pass with 75mm x 40 mm solitaire X retriever device achieving a TICI 2A  Revascularization ,and placement of a 68mm x 61mm atlas neuroform stent achieving a TICI 2b revascularization.  Significant Diagnostic Tests:  CT head 12/3 > Hyperdense Right MCA M1 segment compatible with ELVO. Subtle cytotoxic edema suspected in the right insula. Negative for bleed. CTA and perfusion head 12/3 > Positive for LVO (occluded Right ICA, Right ICA terminus and Right MCA) with CT Perfusion criteria favorable for endovascular reperfusion. Positive also for occluded Right Vertebral Artery and soft plaque or thrombus at the Left Vertebral Artery origin. In conjunction with #1 this might reflect sequelae of cardiac or paradoxical emboli. Superimposed intracranial atherosclerosis including moderate or severe stenosis of the left ICA supraclinoid segment, Left vertebral artery V4 segment, right PCA P1/P2.  Micro Data:    Antimicrobials:   Periop cefazolin.   Interim history/subjective:    Objective   Blood pressure (!) 167/73, pulse 94, temperature (!) 92.4 F (33.6 C), temperature source Rectal, resp. rate 18, height 5\' 8"  (1.727 m), SpO2 100 %.    Vent Mode: PRVC FiO2 (%):  [40 %] 40 %  Set Rate:  [16 bmp] 16 bmp Vt Set:  [540 mL] 540 mL PEEP:  [5 cmH20] 5 cmH20 Plateau Pressure:  [15 cmH20] 15 cmH20   Intake/Output Summary (Last 24 hours) at 06/08/2018  2103 Last data filed at 06/08/2018 2010 Gross per 24 hour  Intake 600 ml  Output 100 ml  Net 500 ml   There were no vitals filed for this visit.  Examination: General: older adult male on vent HENT: [place]/AT, PERRL, no JVD Lungs: Clear Cardiovascular: RRR, no MRG. LUE HD fistula.  Abdomen: Soft, non-distended Extremities: No acute deformity Neuro: Sedated RASS -4  Resolved Hospital Problem list     Assessment & Plan:   Acute CVA: S/p endovascular recanalization on 12/3. Location of multiple strokes concerning for embolic source either carotid or cardiac.  - Management per neuro and IR  - Tight BP control with clevidipine (SBP 120-140) - Neurochecks - Sedation with propofol for RASS goal 0 to -1.   Post op respiratory failure: supposedly he was left intubated because of his history of pulmonary hypertension, which I am unable to identify in the medical record.  - Full vent support - Follow CXR, ABG. - VAP bundle - Evaluate for extubation in AM.   Chronic HFpEF: Patient of Dr. Virgina Jock. LVEF 20%.  - Echo pending - Holding medications for now as below, with the exception of metoprolol in this advanced HF patient.   Severe pulmonary hypertension - Echo pending - Take care with IV fluids.   CKD: has fistula placed, but has not used yet. Creatinine has been in the 5-6 range PTA. At risk of worsening after dye load.  - Serial BMP   OSA: patient of RA. On CPAP 10cmH20 - Resume CPAP post extubation.   Hypokalemia - Repeat BMP  HTN: - Holding home furosemide, doxazosin for now. Plan to restart 12/4   Best practice:  Diet: NPO Pain/Anxiety/Delirium protocol (if indicated): Propofol VAP protocol (if indicated): Y DVT prophylaxis: SCDs GI prophylaxis: Protonix Glucose control: monitor Mobility: BR Code Status: FULL Family Communication:  Disposition:  ICU  Labs   CBC: Recent Labs  Lab 06/08/18 1514 06/08/18 1526  WBC 8.3  --   NEUTROABS 6.8  --   HGB 12.7*  14.3  HCT 39.9 42.0  MCV 87.9  --   PLT 153  --     Basic Metabolic Panel: Recent Labs  Lab 06/08/18 1514 06/08/18 1526  NA 137 138  K 3.4* 3.4*  CL 103 107  CO2 20*  --   GLUCOSE 116* 114*  BUN 63* 54*  CREATININE 4.95* 5.10*  CALCIUM 10.5*  --    GFR: CrCl cannot be calculated (Unknown ideal weight.). Recent Labs  Lab 06/08/18 1514  WBC 8.3    Liver Function Tests: Recent Labs  Lab 06/08/18 1514  AST 18  ALT 15  ALKPHOS 87  BILITOT 0.9  PROT 6.7  ALBUMIN 4.0   No results for input(s): LIPASE, AMYLASE in the last 168 hours. No results for input(s): AMMONIA in the last 168 hours.  ABG    Component Value Date/Time   PHART 7.368 11/21/2015 0404   PCO2ART 45.4 (H) 11/21/2015 0404   PO2ART 130 (H) 11/21/2015 0404   HCO3 25.5 (H) 11/21/2015 0404   TCO2 23 06/08/2018 1526   O2SAT 98.8 11/21/2015 0404     Coagulation Profile: Recent Labs  Lab 06/08/18 1514  INR 1.06    Cardiac Enzymes: No results for input(s): CKTOTAL, CKMB, CKMBINDEX, TROPONINI in  the last 168 hours.  HbA1C: Hgb A1c MFr Bld  Date/Time Value Ref Range Status  12/20/2009 12:36 PM 6.0 4.6 - 6.5 % Final    Comment:    See lab report for associated comment(s)  05/11/2006 10:14 AM 5.8 4.6 - 6.0 % Final    Comment:    See lab report for associated comment(s)    CBG: Recent Labs  Lab 06/08/18 1515  GLUCAP 102*    Review of Systems:     Past Medical History  He,  has a past medical history of Anemia, Arthritis, Chronic renal insufficiency (07/31/2011), COPD (chronic obstructive pulmonary disease) ([place]), Deficiency anemia (07/20/2012), Diverticulosis, Edema (09/21/2010), ERECTILE DYSFUNCTION, MILD (05/11/2007), Exertional dyspnea (10/30/2015), GOUT (05/11/2007), Hereditary and idiopathic peripheral neuropathy (09/14/2014), History of radiation therapy (02/21/13-03/31/13), Hypersomnia (10/30/2015), Hypertension, Lung nodule (10/30/2015), OAB (overactive bladder) (01/09/2016), Obesity (BMI  30.0-34.9) (10/30/2015), Obstructive sleep apnea (07/29/2010), Peripheral edema (09/19/2013), rectal ca (dx'd 02/01/13), S/P partial lobectomy of lung (11/20/2015), S/P thoracotomy, and SUI (stress urinary incontinence), male (01/09/2016).   Surgical History    Past Surgical History:  Procedure Laterality Date  . AV FISTULA PLACEMENT Left 03/24/2016   Procedure: CREATION OF LEFT BRACIOCEPHALIC ARTERIOVENOUS (AV) FISTULA;  Surgeon: Elam Dutch, MD;  Location: [facility];  Service: Vascular;  Laterality: Left;  . COLON SURGERY  05/20/2013  . COLONOSCOPY  2010   [facility]  . EUS N/A 02/03/2013   Procedure: LOWER ENDOSCOPIC ULTRASOUND (EUS);  Surgeon: Milus Banister, MD;  Location: [facility];  Service: Endoscopy;  Laterality: N/A;  . ILEOSTOMY CLOSURE N/A 08/24/2013   Procedure: CLOSURE OF LOOP ILEOSTOMY ;  Surgeon: Leighton Ruff, MD;  Location: [facility];  Service: General;  Laterality: N/A;  . LAPAROSCOPIC LOW ANTERIOR RESECTION N/A 05/20/2013   Procedure: LAPAROSCOPIC LOW ANTERIOR RESECTION WITH SPLENIC FLEXURE MOBILIZATION;  Surgeon: Leighton Ruff, MD;  Location: [facility];  Service: General;  Laterality: N/A;  . OSTOMY N/A 05/20/2013   Procedure: diverting OSTOMY;  Surgeon: Leighton Ruff, MD;  Location: [facility];  Service: General;  Laterality: N/A;  . VIDEO ASSISTED THORACOSCOPY (VATS)/WEDGE RESECTION Left 11/20/2015   Procedure: VIDEO ASSISTED THORACOSCOPY (VATS)/LUNG RESECTION;  Surgeon: [name], MD;  Location: [facility];  Service: Thoracic;  Laterality: Left;  Marland Kitchen VIDEO BRONCHOSCOPY N/A 11/20/2015   Procedure: VIDEO BRONCHOSCOPY;  Surgeon: [name], MD;  Location: [facility];  Service: Thoracic;  Laterality: N/A;     Social History   reports that he quit smoking about 40 years ago. His smoking use included cigarettes. He has a 5.00 pack-year smoking history. He has never used smokeless tobacco. He reports that he drinks alcohol. He reports that he does not use drugs.   Family History   His  family history includes Hypertension in his father, mother, and other; Stroke in his father and mother. There is no history of Colon cancer.   Allergies Allergies  Allergen Reactions  . Nsaids Other (See Comments)    Asked by surgeon to add this medication class as intolerance due to patients renal insufficiency.  . Amlodipine Other (See Comments)    Edema      Home Medications  Prior to Admission medications   Medication Sig Start Date End Date Taking? Authorizing Provider  acetaminophen (TYLENOL) 325 MG tablet Take 650 mg by mouth every 6 (six) hours as needed.    [provider]  allopurinol (ZYLOPRIM) 100 MG tablet Take 1 tablet (100 mg total) by mouth daily. 05/11/18   Mikhail,  Maryann, DO  calcium carbonate (OSCAL) 1500 (600 Ca) MG TABS tablet Take 600 mg of elemental calcium by mouth daily.    [provider]  cholecalciferol (VITAMIN D) 1000 units tablet Take 5,000 Units by mouth daily.    [provider]  doxazosin (CARDURA) 8 MG tablet Take 1 tablet (8 mg total) by mouth daily. 02/05/18   [name], MD  furosemide (LASIX) 80 MG tablet Take 1 tablet (80 mg total) by mouth daily. 05/12/18   Mikhail, Velta Addison, DO  gabapentin (NEURONTIN) 300 MG capsule Take 1 capsule (300 mg total) by mouth at bedtime. Take 300mg s morning and 600mg s at bedtime 05/10/18   Mikhail, Velta Addison, DO  KLOR-CON M20 20 MEQ tablet Take 1 tablet (20 mEq total) by mouth daily. 12/11/17   [name], MD  metoprolol succinate (TOPROL-XL) 50 MG 24 hr tablet Take 50 mg by mouth daily. 04/15/18   [provider]  polycarbophil (FIBERCON) 625 MG tablet Take 1,250 mg by mouth as needed.     [provider]     Critical care time: 35 mins     Georgann Housekeeper, AGACNP-BC [place] Pager [phone] or [phone]  06/08/2018 9:38 PM

## 2018-06-08 NOTE — Anesthesia Procedure Notes (Signed)
Arterial Line Insertion Start/End12/09/2017 4:15 PM Performed by: Julieta Bellini, CRNA, CRNA  Preanesthetic checklist: patient identified, IV checked, site marked, risks and benefits discussed, surgical consent, monitors and equipment checked, pre-op evaluation, timeout performed and anesthesia consent Right, radial was placed Catheter size: 20 G Hand hygiene performed  and maximum sterile barriers used   Attempts: 2 Procedure performed without using ultrasound guided technique. Following insertion, dressing applied. Post procedure assessment: normal  Patient tolerated the procedure well with no immediate complications.

## 2018-06-08 NOTE — Anesthesia Preprocedure Evaluation (Signed)
Anesthesia Evaluation   Patient confused    Reviewed: Allergy & Precautions, Patient's Chart, lab work & pertinent test results, Unable to perform ROS - Chart review onlyPreop documentation limited or incomplete due to emergent nature of procedure.  History of Anesthesia Complications Negative for: history of anesthetic complications  Airway        Dental   Pulmonary sleep apnea , COPD, former smoker,           Cardiovascular hypertension, +CHF     '19 TTE - Mild concentric LVH, severe global hypokinesis, EF 50%, grade 2 diastolic dysfx. Moderately dilated LA, mildly dilated RA. Mild MR. Mod-severe TR. Severe pulmonary hypertension, PASP 79mmHg    Neuro/Psych CVA, Residual Symptoms    GI/Hepatic   Endo/Other    Renal/GU ESRFRenal disease ESRD with recently placed dialysis catheter, has not yet started dialysis      Musculoskeletal  (+) Arthritis ,   Abdominal   Peds  Hematology   Anesthesia Other Findings   Reproductive/Obstetrics                             Anesthesia Physical Anesthesia Plan  ASA: IV and emergent  Anesthesia Plan: General   Post-op Pain Management:    Induction: Intravenous, Rapid sequence and Cricoid pressure planned  PONV Risk Score and Plan: 2 and Treatment may vary due to age or medical condition, Ondansetron and Dexamethasone  Airway Management Planned: Oral ETT  Additional Equipment: Arterial line  Intra-op Plan:   Post-operative Plan: Post-operative intubation/ventilation  Informed Consent:   History available from chart only and Only emergency history available  Plan Discussed with: CRNA, Anesthesiologist and Surgeon  Anesthesia Plan Comments: (Given significant pulmonary hypertension in setting of acute CVA, will not attempt to extubate patient at end of procedure. Will transport to ICU intubated, proceduralist aware.)        Anesthesia  Quick Evaluation

## 2018-06-08 NOTE — ED Notes (Signed)
Pt CBG was 102, notified [place](RN)

## 2018-06-08 NOTE — ED Notes (Signed)
Pt's arrival into IR. IR team given report on pt.

## 2018-06-08 NOTE — Anesthesia Procedure Notes (Signed)
Procedure Name: Intubation Date/Time: 06/08/2018 4:02 PM Performed by: Bryson Corona, CRNA Pre-anesthesia Checklist: Patient identified, Emergency Drugs available, Suction available and Patient being monitored Patient Re-evaluated:Patient Re-evaluated prior to induction Oxygen Delivery Method: Circle System Utilized Preoxygenation: Pre-oxygenation with 100% oxygen Induction Type: IV induction Ventilation: Mask ventilation without difficulty Laryngoscope Size: Mac and 4 Grade View: Grade I Tube type: Oral Tube size: 7.0 mm Number of attempts: 1 Airway Equipment and Method: Stylet and Oral airway Placement Confirmation: ETT inserted through vocal cords under direct vision,  positive ETCO2 and breath sounds checked- equal and bilateral Secured at: 22 cm Tube secured with: Tape Dental Injury: Teeth and Oropharynx as per pre-operative assessment

## 2018-06-08 NOTE — Progress Notes (Signed)
RT received pt from IR being bagged by CRNA. Pt ETT holder placed by RT and placed on vent with settings of 540/16/+5/40%. Pt suctioned and tolerating vent well, cuff pressure currently at 26. RT will continue to monitor.

## 2018-06-09 ENCOUNTER — Encounter (HOSPITAL_COMMUNITY): Payer: Self-pay | Admitting: Radiology

## 2018-06-09 ENCOUNTER — Inpatient Hospital Stay (HOSPITAL_COMMUNITY): Payer: Medicare Other

## 2018-06-09 DIAGNOSIS — G4733 Obstructive sleep apnea (adult) (pediatric): Secondary | ICD-10-CM

## 2018-06-09 DIAGNOSIS — Z9989 Dependence on other enabling machines and devices: Secondary | ICD-10-CM

## 2018-06-09 DIAGNOSIS — I10 Essential (primary) hypertension: Secondary | ICD-10-CM

## 2018-06-09 DIAGNOSIS — I639 Cerebral infarction, unspecified: Secondary | ICD-10-CM

## 2018-06-09 DIAGNOSIS — I5022 Chronic systolic (congestive) heart failure: Secondary | ICD-10-CM

## 2018-06-09 DIAGNOSIS — I63231 Cerebral infarction due to unspecified occlusion or stenosis of right carotid arteries: Secondary | ICD-10-CM

## 2018-06-09 DIAGNOSIS — E785 Hyperlipidemia, unspecified: Secondary | ICD-10-CM

## 2018-06-09 DIAGNOSIS — R0689 Other abnormalities of breathing: Secondary | ICD-10-CM

## 2018-06-09 LAB — CBC
HCT: 30 % — ABNORMAL LOW (ref 39.0–52.0)
Hemoglobin: 9.5 g/dL — ABNORMAL LOW (ref 13.0–17.0)
MCH: 27.3 pg (ref 26.0–34.0)
MCHC: 31.7 g/dL (ref 30.0–36.0)
MCV: 86.2 fL (ref 80.0–100.0)
Platelets: 148 10*3/uL — ABNORMAL LOW (ref 150–400)
RBC: 3.48 MIL/uL — ABNORMAL LOW (ref 4.22–5.81)
RDW: 18.8 % — ABNORMAL HIGH (ref 11.5–15.5)
WBC: 13.6 10*3/uL — AB (ref 4.0–10.5)
nRBC: 0 % (ref 0.0–0.2)

## 2018-06-09 LAB — LIPID PANEL
CHOLESTEROL: 171 mg/dL (ref 0–200)
HDL: 48 mg/dL (ref >40)
LDL Cholesterol: 103 mg/dL — ABNORMAL HIGH (ref 0–99)
Total CHOL/HDL Ratio: 3.6 RATIO
Triglycerides: 101 mg/dL (ref <150)
VLDL: 20 mg/dL (ref 0–40)

## 2018-06-09 LAB — CBC WITH DIFFERENTIAL/PLATELET
Abs Immature Granulocytes: 0.03 10*3/uL (ref 0.00–0.07)
Basophils Absolute: 0 10*3/uL (ref 0.0–0.1)
Basophils Relative: 0 %
EOS PCT: 0 %
Eosinophils Absolute: 0 10*3/uL (ref 0.0–0.5)
HCT: 34.3 % — ABNORMAL LOW (ref 39.0–52.0)
Hemoglobin: 11.3 g/dL — ABNORMAL LOW (ref 13.0–17.0)
Immature Granulocytes: 0 %
Lymphocytes Relative: 4 %
Lymphs Abs: 0.3 10*3/uL — ABNORMAL LOW (ref 0.7–4.0)
MCH: 27.8 pg (ref 26.0–34.0)
MCHC: 32.9 g/dL (ref 30.0–36.0)
MCV: 84.3 fL (ref 80.0–100.0)
Monocytes Absolute: 1.1 10*3/uL — ABNORMAL HIGH (ref 0.1–1.0)
Monocytes Relative: 12 %
Neutro Abs: 7.6 10*3/uL (ref 1.7–7.7)
Neutrophils Relative %: 84 %
Platelets: 149 10*3/uL — ABNORMAL LOW (ref 150–400)
RBC: 4.07 MIL/uL — ABNORMAL LOW (ref 4.22–5.81)
RDW: 18.1 % — ABNORMAL HIGH (ref 11.5–15.5)
WBC: 9.1 10*3/uL (ref 4.0–10.5)
nRBC: 0 % (ref 0.0–0.2)

## 2018-06-09 LAB — RAPID URINE DRUG SCREEN, HOSP PERFORMED
Amphetamines: NOT DETECTED
Barbiturates: NOT DETECTED
Benzodiazepines: NOT DETECTED
COCAINE: NOT DETECTED
Opiates: NOT DETECTED
Tetrahydrocannabinol: NOT DETECTED

## 2018-06-09 LAB — URINALYSIS, ROUTINE W REFLEX MICROSCOPIC
Bacteria, UA: NONE SEEN
Bilirubin Urine: NEGATIVE
Glucose, UA: NEGATIVE mg/dL
Ketones, ur: NEGATIVE mg/dL
Leukocytes, UA: NEGATIVE
Nitrite: NEGATIVE
PH: 5 (ref 5.0–8.0)
Protein, ur: 100 mg/dL — AB
SPECIFIC GRAVITY, URINE: 1.029 (ref 1.005–1.030)

## 2018-06-09 LAB — BASIC METABOLIC PANEL
Anion gap: 11 (ref 5–15)
BUN: 58 mg/dL — AB (ref 8–23)
CO2: 22 mmol/L (ref 22–32)
CREATININE: 4.84 mg/dL — AB (ref 0.61–1.24)
Calcium: 9.4 mg/dL (ref 8.9–10.3)
Chloride: 105 mmol/L (ref 98–111)
GFR calc Af Amer: 13 mL/min — ABNORMAL LOW (ref >60)
GFR calc non Af Amer: 11 mL/min — ABNORMAL LOW (ref >60)
Glucose, Bld: 76 mg/dL (ref 70–99)
Potassium: 3.8 mmol/L (ref 3.5–5.1)
Sodium: 138 mmol/L (ref 135–145)

## 2018-06-09 LAB — GLUCOSE, CAPILLARY: Glucose-Capillary: 103 mg/dL — ABNORMAL HIGH (ref 70–99)

## 2018-06-09 LAB — HEMOGLOBIN A1C
Hgb A1c MFr Bld: 5.5 % (ref 4.8–5.6)
MEAN PLASMA GLUCOSE: 111.15 mg/dL

## 2018-06-09 MED ORDER — HEPARIN SODIUM (PORCINE) 5000 UNIT/ML IJ SOLN
5000.0000 [IU] | Freq: Three times a day (TID) | INTRAMUSCULAR | Status: DC
  Administered 2018-06-09 – 2018-06-16 (×17): 5000 [IU] via SUBCUTANEOUS
  Filled 2018-06-09 (×18): qty 1

## 2018-06-09 MED ORDER — ENSURE ENLIVE PO LIQD
237.0000 mL | Freq: Two times a day (BID) | ORAL | Status: DC
  Administered 2018-06-10 – 2018-06-15 (×9): 237 mL via ORAL
  Filled 2018-06-09: qty 237

## 2018-06-09 MED ORDER — GABAPENTIN 300 MG PO CAPS: 300.0000 mg | ORAL_CAPSULE | Freq: Three times a day (TID) | ORAL | 5 refills | Status: DC

## 2018-06-09 MED ORDER — ADULT MULTIVITAMIN W/MINERALS CH
1.0000 | ORAL_TABLET | Freq: Every day | ORAL | Status: DC
  Administered 2018-06-10 – 2018-06-12 (×3): 1 via ORAL
  Filled 2018-06-09 (×3): qty 1

## 2018-06-09 MED ORDER — GABAPENTIN 300 MG PO CAPS
300.0000 mg | ORAL_CAPSULE | Freq: Three times a day (TID) | ORAL | Status: DC
  Administered 2018-06-09 – 2018-06-16 (×21): 300 mg via ORAL
  Filled 2018-06-09 (×21): qty 1

## 2018-06-09 MED ORDER — TRAMADOL HCL 50 MG PO TABS
50.0000 mg | ORAL_TABLET | Freq: Two times a day (BID) | ORAL | Status: DC | PRN
  Administered 2018-06-09 – 2018-06-16 (×8): 50 mg via ORAL
  Filled 2018-06-09 (×8): qty 1

## 2018-06-09 MED ORDER — FENTANYL CITRATE (PF) 100 MCG/2ML IJ SOLN
25.0000 ug | Freq: Once | INTRAMUSCULAR | Status: AC
  Administered 2018-06-09: 25 ug via INTRAVENOUS

## 2018-06-09 NOTE — Telephone Encounter (Addendum)
Left message infaorming pt of Dr. Mellody Drown orders for Gabapentin 300mg  one tablet in the morning and 2 tablets at bedtime would be called to [facility].

## 2018-06-09 NOTE — Progress Notes (Signed)
SLP Cancellation Note  Patient Details Name: Jason LEVERETTE Sr. MRN: [id number] DOB: 1946-03-03   Cancelled treatment:       Reason Eval/Treat Not Completed: Medical issues which prohibited therapy. Intubated. Will f/u when ready.    [name], Katherene Ponto 06/09/2018, 7:29 AM

## 2018-06-09 NOTE — Progress Notes (Signed)
PT Cancellation Note  Patient Details Name: Jason [name] Sr. MRN: [id number] DOB: May 12, 1946   Cancelled Treatment:    Reason Eval/Treat Not Completed: Medical issues which prohibited therapy.  Pt bleeding profusely from groin site.  Pt on bed flat until 2000.  PT will check back tomorrow.  Thanks,     Barbarann Ehlers. Marilynne Dupuis, PT, DPT  Acute Rehabilitation [phone] pager #[phone] office   06/09/2018, 3:47 PM

## 2018-06-09 NOTE — Progress Notes (Signed)
Initial Nutrition Assessment  DOCUMENTATION CODES:   Not applicable  INTERVENTION:    Ensure Enlive po BID, each supplement provides 350 kcal and 20 grams of protein  Monitor for diet advancement/toleration  Provide MVI daily  NUTRITION DIAGNOSIS:   Increased nutrient needs related to post-op healing as evidenced by estimated needs.  GOAL:   Patient will meet greater than or equal to 90% of their needs  MONITOR:   Diet advancement, Labs, I & O's, Weight trends, Skin  REASON FOR ASSESSMENT:   Other (Comment)    ASSESSMENT:   Patient with PMH significant for COPD, CHF, rectal cancer s/p radiation/chemotherapy, HTN, CKD (fistula placed, not used yet), and OSA. Presents this admission with right ICA and MCA occlusion.    12/3- revascularization with stenting right ICA and angioplasty right MCA/ACA 12/4- extubated   Per neurology pt is rapidly improving. He is now awake and following commands. Initially it was noted that pt may need PEG. SLP saw him this afternoon and recommended full liquid diet. Wife is to bring dentures tomorrow so they can attempt regular consistency.   Pt lethargic upon visit, obtained history from wife. Wife reports pt's appetite was normal PTA. He typically eats three meals with snacks daily. Breakfast consists of cereal with milk, he has turkey/beef sandwich for lunch, and meat with vegetables for dinner. He tries to stay away from pork, eggs, and dairy products per MD recommendations. Discussed the importance of protein intake while on full liquid diet. Will provide Ensure until diet is advanced and pt is meeting his needs with meals alone.   Wife endorses pt has lost weight slowly over time but hasn't seen a significant amount of loss. She is unsure of his UBW. Records indicate pt weighed 205 lb on 04/12/18 and 185 lb this admission. Unable to quantify how much is actual wt loss verus fluid loss as pt has history of CHF. Nutrition-Focused physical exam  completed.   Medications reviewed.  Labs reviewed.   NUTRITION - FOCUSED PHYSICAL EXAM:    Most Recent Value  Orbital Region  No depletion  Upper Arm Region  Mild depletion  Thoracic and Lumbar Region  Unable to assess [nursing care]  Buccal Region  No depletion  Temple Region  No depletion  Clavicle Bone Region  Mild depletion  Clavicle and Acromion Bone Region  No depletion  Scapular Bone Region  Unable to assess  Dorsal Hand  No depletion  Patellar Region  Mild depletion  Anterior Thigh Region  Mild depletion  Posterior Calf Region  Unable to assess  Edema (RD Assessment)  None  Hair  Reviewed  Eyes  Reviewed  Mouth  Reviewed  Skin  Reviewed  Nails  Reviewed     Diet Order:   Diet Order            Diet full liquid Room service appropriate? Yes; Fluid consistency: Thin  Diet effective now              EDUCATION NEEDS:   Education needs have been addressed  Skin:  Skin Assessment: Skin Integrity Issues: Skin Integrity Issues:: Incisions Incisions: groin  Last BM:  PTA  Height:   Ht Readings from Last 1 Encounters:  06/08/18 5\' 8"  (1.727 m)    Weight:   Wt Readings from Last 1 Encounters:  06/08/18 83.9 kg    Ideal Body Weight:  70 kg  BMI:  Body mass index is 28.12 kg/m.  Estimated Nutritional Needs:   Kcal:  2100-2300 kcal  Protein:  85-100 grams  Fluid:  >/= 2.1 L/day   Mariana Single RD, LDN Clinical Nutrition Pager # - [phone]

## 2018-06-09 NOTE — Progress Notes (Signed)
Patient unable to void post IR procedure. Sedated on propofol with bladder scan >600. Order received to place foley.

## 2018-06-09 NOTE — Evaluation (Signed)
Clinical/Bedside Swallow Evaluation Patient Details  Name: Jason RATHJE Sr. MRN: [id number] Date of Birth: 01-Feb-1946  Today's Date: 06/09/2018 Time: SLP Start Time (ACUTE ONLY): 1335 SLP Stop Time (ACUTE ONLY): 1348 SLP Time Calculation (min) (ACUTE ONLY): 13 min  Past Medical History:  Past Medical History:  Diagnosis Date  . Anemia   . Arthritis   . Chronic renal insufficiency 07/31/2011  . COPD (chronic obstructive pulmonary disease) ([place])    pt denies  . Deficiency anemia 07/20/2012  . Diverticulosis   . Edema 09/21/2010   Qualifier: Diagnosis of  By: Jerold Coombe    . ERECTILE DYSFUNCTION, MILD 05/11/2007   Qualifier: Diagnosis of  By: Sherren Mocha MD, Jory Ee   . Exertional dyspnea 10/30/2015  . GOUT 05/11/2007   Qualifier: Diagnosis of  By: Sherren Mocha MD, Jory Ee   . Hereditary and idiopathic peripheral neuropathy 09/14/2014  . History of radiation therapy 02/21/13-03/31/13   rectum 50.4Gy total dose  . Hypersomnia 10/30/2015  . Hypertension   . Lung nodule 10/30/2015  . OAB (overactive bladder) 01/09/2016  . Obesity (BMI 30.0-34.9) 10/30/2015  . Obstructive sleep apnea 07/29/2010   HST 11/2015 AHI 54.  On autocpap  >> 10 cm   . Peripheral edema 09/19/2013  . rectal ca dx'd 02/01/13   rectal. Radiation and chemotherapy- remains on chemotherapy-next tx. 08-22-13.   . S/P partial lobectomy of lung 11/20/2015  . S/P thoracotomy   . SUI (stress urinary incontinence), male 01/09/2016   Past Surgical History:  Past Surgical History:  Procedure Laterality Date  . AV FISTULA PLACEMENT Left 03/24/2016   Procedure: CREATION OF LEFT BRACIOCEPHALIC ARTERIOVENOUS (AV) FISTULA;  Surgeon: Elam Dutch, MD;  Location: [facility];  Service: Vascular;  Laterality: Left;  . COLON SURGERY  05/20/2013  . COLONOSCOPY  2010   [facility]  . EUS N/A 02/03/2013   Procedure: LOWER ENDOSCOPIC ULTRASOUND (EUS);  Surgeon: Milus Banister, MD;  Location: [facility];  Service: Endoscopy;  Laterality: N/A;  . ILEOSTOMY  CLOSURE N/A 08/24/2013   Procedure: CLOSURE OF LOOP ILEOSTOMY ;  Surgeon: Leighton Ruff, MD;  Location: [facility];  Service: General;  Laterality: N/A;  . LAPAROSCOPIC LOW ANTERIOR RESECTION N/A 05/20/2013   Procedure: LAPAROSCOPIC LOW ANTERIOR RESECTION WITH SPLENIC FLEXURE MOBILIZATION;  Surgeon: Leighton Ruff, MD;  Location: [facility];  Service: General;  Laterality: N/A;  . OSTOMY N/A 05/20/2013   Procedure: diverting OSTOMY;  Surgeon: Leighton Ruff, MD;  Location: [facility];  Service: General;  Laterality: N/A;  . VIDEO ASSISTED THORACOSCOPY (VATS)/WEDGE RESECTION Left 11/20/2015   Procedure: VIDEO ASSISTED THORACOSCOPY (VATS)/LUNG RESECTION;  Surgeon: [name], MD;  Location: [facility];  Service: Thoracic;  Laterality: Left;  Marland Kitchen VIDEO BRONCHOSCOPY N/A 11/20/2015   Procedure: VIDEO BRONCHOSCOPY;  Surgeon: [name], MD;  Location: [facility];  Service: Thoracic;  Laterality: N/A;   HPI:  72 y.o. male with history of CKD, COPD, anemia, HTN, obesity, OSA on CPAP, rectal cancer found down at home and admitted with dysarthric speech, left sided weakness.  Dx right ICA/MCA occlusion, s/p IR and stents. Intubated for procedure, extubated next day 12/4.     Assessment / Plan / Recommendation Clinical Impression  Pt presents with functional oropharyngeal swallow marked by adequate oral anticipation/preparation of thin liquids and soft solids; the appearance of a brisk swallow response; no overt s/s of aspiration despite consuming three oz water over consecutive swallows.  Presents with mild left facial/lingual asymmetry on right.  Voice  was mildly hoarse but improved as session progressed.  Recommend initiating a full liquid diet; pt's wife will bring in dentures tomorrow.  SLP will f/u x1 to ensure toleration/safety with regular diet; speech language assessment pending.  SLP Visit Diagnosis: Dysphagia, unspecified (R13.10)    Aspiration Risk       Diet Recommendation   full liquids  Medication  Administration: Whole meds with puree    Other  Recommendations Oral Care Recommendations: Oral care BID   Follow up Recommendations None      Frequency and Duration min 1 x/week  1 week       Prognosis Prognosis for Safe Diet Advancement: Good      Swallow Study   General Date of Onset: 06/08/18 HPI: 72 y.o. male with history of CKD, COPD, anemia, HTN, obesity, OSA on CPAP, rectal cancer found down at home and admitted with dysarthric speech, left sided weakness.  Dx right ICA/MCA occlusion, s/p IR and stents. Intubated for procedure, extubated next day 12/4.   Type of Study: Bedside Swallow Evaluation Previous Swallow Assessment: no Diet Prior to this Study: NPO Temperature Spikes Noted: No Respiratory Status: Room air History of Recent Intubation: Yes Length of Intubations (days): 1 days Date extubated: 06/09/18 Behavior/Cognition: Alert;Cooperative Oral Cavity Assessment: Within Functional Limits Oral Care Completed by SLP: Recent completion by staff Oral Cavity - Dentition: Edentulous Vision: Functional for self-feeding Self-Feeding Abilities: Able to feed self Patient Positioning: Upright in bed Baseline Vocal Quality: Hoarse Volitional Cough: Strong Volitional Swallow: Able to elicit    Oral/Motor/Sensory Function Overall Oral Motor/Sensory Function: Mild impairment Facial Symmetry: Abnormal symmetry left;Suspected CN VII (facial) dysfunction Lingual Symmetry: Abnormal symmetry left;Suspected CN XII (hypoglossal) dysfunction(mild)   Ice Chips Ice chips: Within functional limits   Thin Liquid Thin Liquid: Within functional limits    Nectar Thick Nectar Thick Liquid: Not tested   Honey Thick Honey Thick Liquid: Not tested   Puree Puree: Within functional limits   Solid     Solid: Not tested(spouse states he does not eat solids without teeth)      Stillman Buenger Laurice 06/09/2018,1:58 PM  Ranette Luckadoo L. Tivis Ringer, [place] Office number [phone] Pager [phone]

## 2018-06-09 NOTE — Procedures (Signed)
Extubation Procedure Note  Patient Details:   Name: CAYSON KALB Sr. DOB: 1945/08/23 MRN: [id number]   Airway Documentation:    Vent end date: 06/09/18 Vent end time: 1049   Evaluation  O2 sats: stable throughout Complications: No apparent complications Patient did tolerate procedure well. Bilateral Breath Sounds: Rhonchi, Diminished   Yes   Pt extubated to 3L [place] per MD order. Positive cuff leak noted prior to extubation. Pt able to speak and has a weak non-productive cough but was encouraged to use [place] to clear secretions. No stridor noted, no WOB, Vitals within normal limits.   Jesse Sans 06/09/2018, 10:56 AM

## 2018-06-09 NOTE — Progress Notes (Signed)
OT Cancellation Note  Patient Details Name: Jason [name]. MRN: [id number] DOB: Nov 07, 1945   Cancelled Treatment:    Reason Eval/Treat Not Completed: Medical issues which prohibited therapy.  Pt needing to maintain flat in bed until 2000 due to bleeding profusely from groin site.  OT will continue to follow and check back tomorrow as able.    Delight Stare, OT Acute Rehabilitation Services Pager [phone] Office [phone]    Delight Stare 06/09/2018, 3:48 PM

## 2018-06-09 NOTE — Progress Notes (Signed)
Pt bed saturated with blood from groin site, noted by family at 11. Estill Bamberg, RN at bedside holding pressure. Dr. Estanislado Pandy on unit and came to see pt. Applied vpad and held pressure. Verbal order to hold pressure until 1430 and then apply pressure dressing. HOB flat until @2000 . Verbal order for stat CB. Will continue to closely monitor pt.

## 2018-06-09 NOTE — Anesthesia Postprocedure Evaluation (Signed)
Anesthesia Post Note  Patient: Jason EASTHAM Sr.  Procedure(s) Performed: IR WITH ANESTHESIA (N/A )     Patient location during evaluation: SICU Anesthesia Type: General Level of consciousness: sedated Pain management: pain level controlled Vital Signs Assessment: post-procedure vital signs reviewed and stable Respiratory status: patient remains intubated per anesthesia plan Cardiovascular status: stable Postop Assessment: no apparent nausea or vomiting Anesthetic complications: no    Last Vitals:  Vitals:   06/09/18 0700 06/09/18 0740  BP: (!) 129/50 (!) 76/66  Pulse: 68 71  Resp: 16 16  Temp:    SpO2: 100% 100%    Last Pain:  Vitals:   06/09/18 0400  TempSrc: Axillary                 Audreyanna Butkiewicz DAVID

## 2018-06-09 NOTE — Progress Notes (Signed)
Pt c/o back pain, reports issues with chronic back pain. Dr. Estanislado Pandy and Dr. Erlinda Hong aware, see associated orders.

## 2018-06-09 NOTE — Progress Notes (Signed)
Echocardiogram 2D Echocardiogram has been performed.  06/09/2018 5:15 PM Maudry Mayhew, MHA, RVT, RDCS, RDMS

## 2018-06-09 NOTE — Addendum Note (Signed)
Addended by: Harriett Sine D on: 06/09/2018 01:46 PM   Modules accepted: Orders

## 2018-06-09 NOTE — Telephone Encounter (Signed)
That is fine 

## 2018-06-09 NOTE — Progress Notes (Signed)
STROKE TEAM PROGRESS NOTE   SUBJECTIVE (INTERVAL HISTORY) His wife and daughter in law are at the bedside.  Overall his condition is rapidly improving. He is awake and eyes open, follows all simple commands, moving left UE and LE although still weaker than right. On weaning trial, plan to extubate today. Pending MRI and MRA.    OBJECTIVE Temp:  [92.4 F (33.6 C)-98.6 F (37 C)] 98.6 F (37 C) (12/04 0800) Pulse Rate:  [65-121] 87 (12/04 0930) Cardiac Rhythm: Normal sinus rhythm (12/04 0800) Resp:  [15-18] 18 (12/04 0930) BP: (76-167)/(50-114) 125/82 (12/04 0930) SpO2:  [99 %-100 %] 100 % (12/04 0943) Arterial Line BP: (121-172)/(48-74) 132/54 (12/04 0930) FiO2 (%):  [30 %-40 %] 30 % (12/04 0740) Weight:  [83.9 kg] 83.9 kg (12/03 2145)  Recent Labs  Lab 06/08/18 1515 06/09/18 0812  GLUCAP 102* 103*   Recent Labs  Lab 06/08/18 1514 06/08/18 1526 06/08/18 2156 06/09/18 0454  NA 137 138 134* 138  K 3.4* 3.4* 3.9 3.8  CL 103 107 101 105  CO2 20*  --  20* 22  GLUCOSE 116* 114* 193* 76  BUN 63* 54* 58* 58*  CREATININE 4.95* 5.10* 4.57* 4.84*  CALCIUM 10.5*  --  9.8 9.4   Recent Labs  Lab 06/08/18 1514  AST 18  ALT 15  ALKPHOS 87  BILITOT 0.9  PROT 6.7  ALBUMIN 4.0   Recent Labs  Lab 06/08/18 1514 06/08/18 1526 06/09/18 0454  WBC 8.3  --  9.1  NEUTROABS 6.8  --  7.6  HGB 12.7* 14.3 11.3*  HCT 39.9 42.0 34.3*  MCV 87.9  --  84.3  PLT 153  --  149*   No results for input(s): CKTOTAL, CKMB, CKMBINDEX, TROPONINI in the last 168 hours. Recent Labs    06/08/18 1514  LABPROT 13.7  INR 1.06   Recent Labs    06/09/18 0418  COLORURINE YELLOW  LABSPEC 1.029  PHURINE 5.0  GLUCOSEU NEGATIVE  HGBUR SMALL*  BILIRUBINUR NEGATIVE  KETONESUR NEGATIVE  PROTEINUR 100*  NITRITE NEGATIVE  LEUKOCYTESUR NEGATIVE       Component Value Date/Time   CHOL 171 06/09/2018 0454   TRIG 101 06/09/2018 0454   TRIG 104 05/11/2006 1014   HDL 48 06/09/2018 0454   CHOLHDL  3.6 06/09/2018 0454   VLDL 20 06/09/2018 0454   LDLCALC 103 (H) 06/09/2018 0454   Lab Results  Component Value Date   HGBA1C 5.5 06/09/2018      Component Value Date/Time   LABOPIA NONE DETECTED 06/09/2018 0418   COCAINSCRNUR NONE DETECTED 06/09/2018 0418   LABBENZ NONE DETECTED 06/09/2018 0418   AMPHETMU NONE DETECTED 06/09/2018 0418   THCU NONE DETECTED 06/09/2018 0418   LABBARB NONE DETECTED 06/09/2018 0418    Recent Labs  Lab 06/08/18 1514  ETH <10    I have personally reviewed the radiological images below and agree with the radiology interpretations.  Ct Angio Head W Or Wo Contrast  Result Date: 06/08/2018 CLINICAL DATA:  72 year old male code stroke presentation with hyperdense right M1. History of rectal cancer with treated lung metastases. EXAM: CT ANGIOGRAPHY HEAD AND NECK CT PERFUSION BRAIN TECHNIQUE: Multidetector CT imaging of the head and neck was performed using the standard protocol during bolus administration of intravenous contrast. Multiplanar CT image reconstructions and MIPs were obtained to evaluate the vascular anatomy. Carotid stenosis measurements (when applicable) are obtained utilizing NASCET criteria, using the distal internal carotid diameter as the denominator. Multiphase CT imaging of  the brain was performed following IV bolus contrast injection. Subsequent parametric perfusion maps were calculated using RAPID software. CONTRAST:  162mL ISOVUE-370 IOPAMIDOL (ISOVUE-370) INJECTION 76% COMPARISON:  Noncontrast head CT 1521 hours today. Chest CT 12/01/2017. FINDINGS: CT Brain Perfusion Findings: CBF (<30%) Volume: 13 milliliters Perfusion (Tmax>6.0s) volume: 132 milliliters (hypoperfusion index of 0.5) Mismatch Volume: 119 milliliters (mismatch ratio of 10.2 Infarction Location:Deep right MCA territory white matter CTA NECK Skeleton: Absent dentition. No acute osseous abnormality identified. Upper chest: Stable partially calcified posterior left upper lobe lung  nodule measuring 2-3 centimeters. Peripheral right upper lobe nodularity and pleural thickening also appears stable. No superior mediastinal lymphadenopathy. Other neck: Left nasal airway in place.  Otherwise negative. Aortic arch: 3 vessel arch configuration with mild for age arch atherosclerosis. Right carotid system: Negative brachiocephalic and right CCA origin. The right CCA is patent, but at the right carotid bifurcation the right ICA is abruptly occluded in the mid bulb as seen on series 10, image 69. Mild to moderate superimposed right ICA origin plaque. No reconstituted enhancement in the neck. Left carotid system: Negative left CCA origin. Mildly tortuous left CCA. Soft and calcified plaque at the left carotid bifurcation, left ICA origin and bulb resulting in less than 50 % stenosis with respect to the distal vessel. Vertebral arteries: No proximal right subclavian artery stenosis despite mild plaque. There is low-density plaque or thrombus in the right vertebral artery origin and V1 segment resulting in near occlusion (series 9, image 142),. The distal right V1 segment then resumes enhancing but there is poor enhancement throughout the right vertebral artery to the skull base. Mild plaque at the left subclavian artery origin without stenosis. More bulky soft plaque at the proximal left subclavian near the left vertebral artery origin as demonstrated on series 8, image 272. The left vertebral origin is patent, but there is evidence of some left V1 segment soft plaque and stenosis as seen on series 9, image 160. Elsewhere in the neck the and appears normal left vertebral. Artery is patent patent distal CTA HEAD Posterior circulation: Left vertebral artery with calcified plaque resulting in mild to moderate stenosis. The right V4 segment is occluded. The basilar artery is patent although irregular throughout much of its course. There is no high-grade basilar stenosis. The SCA and PCA origins are patent.  There is mild irregularity and stenosis at the left PCA origin (series 12, image 24). Posterior communicating arteries are diminutive or absent. There is contralateral moderate to severe right PCA P1/P2 junction stenosis (series 11, image 21). However, bilateral distal PCA enhancement is symmetric and within normal limits. Anterior circulation: The right ICA siphon is occluded with superimposed calcified plaque. The right ICA terminus is occluded. There is no right MCA M1 or bifurcation enhancement. There is minimal distal right MCA branch enhancement. The left ICA siphon is patent with calcified plaque resulting in moderate to severe supraclinoid segment stenosis as seen on series 10, image 107. The left ICA terminus is patent. The left MCA and ACA origins are normal. The anterior communicating artery is diminutive but patent and appears to supply the right ACA A2 and distal A1 segments. There is mild bilateral ACA irregularity. The left MCA M1 segment and left MCA bifurcation are patent without stenosis. Left MCA branches are mildly irregular. Venous sinuses: Not evaluated due to early contrast timing. Anatomic variants: None. Review of the MIP images confirms the above findings IMPRESSION: 1. Positive for LVO (occluded Right ICA, Right ICA terminus and Right MCA)  with CT Perfusion criteria favorable for endovascular reperfusion (core infarct estimated at 13 mL with mismatch volume 119 mL, mismatch ratio of 10.2). Preliminary report of this was discussed by telephone with Dr. Amie Portland on 06/08/2018 at 15:46. 2. Positive also for occluded Right Vertebral Artery and soft plaque or thrombus at the Left Vertebral Artery origin. In conjunction with #1 this might reflect sequelae of cardiac or paradoxical emboli. This was discussed with Dr. Juliet Rude in [place] at 1359 hours. 3. Superimposed intracranial atherosclerosis including moderate or severe stenosis of the: - Left ICA supraclinoid segment. - Left vertebral  artery V4 segment. - Right PCA P1/P2. 4. Stable visible upper chest since the CT on 12/01/2017. Electronically Signed   By: Genevie Ann M.D.   On: 06/08/2018 16:05   Ct Angio Neck W Or Wo Contrast  Result Date: 06/08/2018 CLINICAL DATA:  72 year old male code stroke presentation with hyperdense right M1. History of rectal cancer with treated lung metastases. EXAM: CT ANGIOGRAPHY HEAD AND NECK CT PERFUSION BRAIN TECHNIQUE: Multidetector CT imaging of the head and neck was performed using the standard protocol during bolus administration of intravenous contrast. Multiplanar CT image reconstructions and MIPs were obtained to evaluate the vascular anatomy. Carotid stenosis measurements (when applicable) are obtained utilizing NASCET criteria, using the distal internal carotid diameter as the denominator. Multiphase CT imaging of the brain was performed following IV bolus contrast injection. Subsequent parametric perfusion maps were calculated using RAPID software. CONTRAST:  113mL ISOVUE-370 IOPAMIDOL (ISOVUE-370) INJECTION 76% COMPARISON:  Noncontrast head CT 1521 hours today. Chest CT 12/01/2017. FINDINGS: CT Brain Perfusion Findings: CBF (<30%) Volume: 13 milliliters Perfusion (Tmax>6.0s) volume: 132 milliliters (hypoperfusion index of 0.5) Mismatch Volume: 119 milliliters (mismatch ratio of 10.2 Infarction Location:Deep right MCA territory white matter CTA NECK Skeleton: Absent dentition. No acute osseous abnormality identified. Upper chest: Stable partially calcified posterior left upper lobe lung nodule measuring 2-3 centimeters. Peripheral right upper lobe nodularity and pleural thickening also appears stable. No superior mediastinal lymphadenopathy. Other neck: Left nasal airway in place.  Otherwise negative. Aortic arch: 3 vessel arch configuration with mild for age arch atherosclerosis. Right carotid system: Negative brachiocephalic and right CCA origin. The right CCA is patent, but at the right carotid  bifurcation the right ICA is abruptly occluded in the mid bulb as seen on series 10, image 69. Mild to moderate superimposed right ICA origin plaque. No reconstituted enhancement in the neck. Left carotid system: Negative left CCA origin. Mildly tortuous left CCA. Soft and calcified plaque at the left carotid bifurcation, left ICA origin and bulb resulting in less than 50 % stenosis with respect to the distal vessel. Vertebral arteries: No proximal right subclavian artery stenosis despite mild plaque. There is low-density plaque or thrombus in the right vertebral artery origin and V1 segment resulting in near occlusion (series 9, image 142),. The distal right V1 segment then resumes enhancing but there is poor enhancement throughout the right vertebral artery to the skull base. Mild plaque at the left subclavian artery origin without stenosis. More bulky soft plaque at the proximal left subclavian near the left vertebral artery origin as demonstrated on series 8, image 272. The left vertebral origin is patent, but there is evidence of some left V1 segment soft plaque and stenosis as seen on series 9, image 160. Elsewhere in the neck the and appears normal left vertebral. Artery is patent patent distal CTA HEAD Posterior circulation: Left vertebral artery with calcified plaque resulting in mild to moderate stenosis.  The right V4 segment is occluded. The basilar artery is patent although irregular throughout much of its course. There is no high-grade basilar stenosis. The SCA and PCA origins are patent. There is mild irregularity and stenosis at the left PCA origin (series 12, image 24). Posterior communicating arteries are diminutive or absent. There is contralateral moderate to severe right PCA P1/P2 junction stenosis (series 11, image 21). However, bilateral distal PCA enhancement is symmetric and within normal limits. Anterior circulation: The right ICA siphon is occluded with superimposed calcified plaque. The  right ICA terminus is occluded. There is no right MCA M1 or bifurcation enhancement. There is minimal distal right MCA branch enhancement. The left ICA siphon is patent with calcified plaque resulting in moderate to severe supraclinoid segment stenosis as seen on series 10, image 107. The left ICA terminus is patent. The left MCA and ACA origins are normal. The anterior communicating artery is diminutive but patent and appears to supply the right ACA A2 and distal A1 segments. There is mild bilateral ACA irregularity. The left MCA M1 segment and left MCA bifurcation are patent without stenosis. Left MCA branches are mildly irregular. Venous sinuses: Not evaluated due to early contrast timing. Anatomic variants: None. Review of the MIP images confirms the above findings IMPRESSION: 1. Positive for LVO (occluded Right ICA, Right ICA terminus and Right MCA) with CT Perfusion criteria favorable for endovascular reperfusion (core infarct estimated at 13 mL with mismatch volume 119 mL, mismatch ratio of 10.2). Preliminary report of this was discussed by telephone with Dr. Amie Portland on 06/08/2018 at 15:46. 2. Positive also for occluded Right Vertebral Artery and soft plaque or thrombus at the Left Vertebral Artery origin. In conjunction with #1 this might reflect sequelae of cardiac or paradoxical emboli. This was discussed with Dr. Juliet Rude in [place] at 1359 hours. 3. Superimposed intracranial atherosclerosis including moderate or severe stenosis of the: - Left ICA supraclinoid segment. - Left vertebral artery V4 segment. - Right PCA P1/P2. 4. Stable visible upper chest since the CT on 12/01/2017. Electronically Signed   By: Genevie Ann M.D.   On: 06/08/2018 16:05   Ct Cerebral Perfusion W Contrast  Result Date: 06/08/2018 CLINICAL DATA:  72 year old male code stroke presentation with hyperdense right M1. History of rectal cancer with treated lung metastases. EXAM: CT ANGIOGRAPHY HEAD AND NECK CT PERFUSION BRAIN  TECHNIQUE: Multidetector CT imaging of the head and neck was performed using the standard protocol during bolus administration of intravenous contrast. Multiplanar CT image reconstructions and MIPs were obtained to evaluate the vascular anatomy. Carotid stenosis measurements (when applicable) are obtained utilizing NASCET criteria, using the distal internal carotid diameter as the denominator. Multiphase CT imaging of the brain was performed following IV bolus contrast injection. Subsequent parametric perfusion maps were calculated using RAPID software. CONTRAST:  144mL ISOVUE-370 IOPAMIDOL (ISOVUE-370) INJECTION 76% COMPARISON:  Noncontrast head CT 1521 hours today. Chest CT 12/01/2017. FINDINGS: CT Brain Perfusion Findings: CBF (<30%) Volume: 13 milliliters Perfusion (Tmax>6.0s) volume: 132 milliliters (hypoperfusion index of 0.5) Mismatch Volume: 119 milliliters (mismatch ratio of 10.2 Infarction Location:Deep right MCA territory white matter CTA NECK Skeleton: Absent dentition. No acute osseous abnormality identified. Upper chest: Stable partially calcified posterior left upper lobe lung nodule measuring 2-3 centimeters. Peripheral right upper lobe nodularity and pleural thickening also appears stable. No superior mediastinal lymphadenopathy. Other neck: Left nasal airway in place.  Otherwise negative. Aortic arch: 3 vessel arch configuration with mild for age arch atherosclerosis. Right carotid system: Negative  brachiocephalic and right CCA origin. The right CCA is patent, but at the right carotid bifurcation the right ICA is abruptly occluded in the mid bulb as seen on series 10, image 69. Mild to moderate superimposed right ICA origin plaque. No reconstituted enhancement in the neck. Left carotid system: Negative left CCA origin. Mildly tortuous left CCA. Soft and calcified plaque at the left carotid bifurcation, left ICA origin and bulb resulting in less than 50 % stenosis with respect to the distal vessel.  Vertebral arteries: No proximal right subclavian artery stenosis despite mild plaque. There is low-density plaque or thrombus in the right vertebral artery origin and V1 segment resulting in near occlusion (series 9, image 142),. The distal right V1 segment then resumes enhancing but there is poor enhancement throughout the right vertebral artery to the skull base. Mild plaque at the left subclavian artery origin without stenosis. More bulky soft plaque at the proximal left subclavian near the left vertebral artery origin as demonstrated on series 8, image 272. The left vertebral origin is patent, but there is evidence of some left V1 segment soft plaque and stenosis as seen on series 9, image 160. Elsewhere in the neck the and appears normal left vertebral. Artery is patent patent distal CTA HEAD Posterior circulation: Left vertebral artery with calcified plaque resulting in mild to moderate stenosis. The right V4 segment is occluded. The basilar artery is patent although irregular throughout much of its course. There is no high-grade basilar stenosis. The SCA and PCA origins are patent. There is mild irregularity and stenosis at the left PCA origin (series 12, image 24). Posterior communicating arteries are diminutive or absent. There is contralateral moderate to severe right PCA P1/P2 junction stenosis (series 11, image 21). However, bilateral distal PCA enhancement is symmetric and within normal limits. Anterior circulation: The right ICA siphon is occluded with superimposed calcified plaque. The right ICA terminus is occluded. There is no right MCA M1 or bifurcation enhancement. There is minimal distal right MCA branch enhancement. The left ICA siphon is patent with calcified plaque resulting in moderate to severe supraclinoid segment stenosis as seen on series 10, image 107. The left ICA terminus is patent. The left MCA and ACA origins are normal. The anterior communicating artery is diminutive but patent and  appears to supply the right ACA A2 and distal A1 segments. There is mild bilateral ACA irregularity. The left MCA M1 segment and left MCA bifurcation are patent without stenosis. Left MCA branches are mildly irregular. Venous sinuses: Not evaluated due to early contrast timing. Anatomic variants: None. Review of the MIP images confirms the above findings IMPRESSION: 1. Positive for LVO (occluded Right ICA, Right ICA terminus and Right MCA) with CT Perfusion criteria favorable for endovascular reperfusion (core infarct estimated at 13 mL with mismatch volume 119 mL, mismatch ratio of 10.2). Preliminary report of this was discussed by telephone with Dr. Amie Portland on 06/08/2018 at 15:46. 2. Positive also for occluded Right Vertebral Artery and soft plaque or thrombus at the Left Vertebral Artery origin. In conjunction with #1 this might reflect sequelae of cardiac or paradoxical emboli. This was discussed with Dr. Juliet Rude in [place] at 1359 hours. 3. Superimposed intracranial atherosclerosis including moderate or severe stenosis of the: - Left ICA supraclinoid segment. - Left vertebral artery V4 segment. - Right PCA P1/P2. 4. Stable visible upper chest since the CT on 12/01/2017. Electronically Signed   By: Genevie Ann M.D.   On: 06/08/2018 16:05   Ct  Head Code Stroke Wo Contrast  Result Date: 06/08/2018 CLINICAL DATA:  Code stroke.  72 year old male. Rightward gaze. EXAM: CT HEAD WITHOUT CONTRAST TECHNIQUE: Contiguous axial images were obtained from the base of the skull through the vertex without intravenous contrast. COMPARISON:  None. FINDINGS: Brain: Asymmetric loss of gray-white matter differentiation at the right insula on series 3, image 17. No other cytotoxic edema identified in the right MCA territory. No midline shift, ventriculomegaly, mass effect, evidence of mass lesion, intracranial hemorrhage. Patchy bilateral white matter hypodensity. Vascular: Calcified atherosclerosis at the skull base.  Hyperdense right MCA M1 segment as seen on series 5, image 26. Skull: No acute osseous abnormality identified. Sinuses/Orbits: left nasal airway. Paranasal sinuses are well pneumatized, mild right maxillary mucosal thickening. Mild right mastoid effusion. Tympanic cavities remain clear. Other: Mild rightward gaze deviation. No other acute orbit or scalp soft tissue findings. ASPECTS [facility] Stroke Program Early CT Score) - Ganglionic level infarction (caudate, lentiform nuclei, internal capsule, insula, M1-M3 cortex): 6 - Supraganglionic infarction (M4-M6 cortex): Total score (0-10 with 10 being normal): IMPRESSION: 1. Hyperdense Right MCA M1 segment compatible with ELVO. Subtle cytotoxic edema suspected in the right insula, ASPECTS is 9. 2. Negative for bleed. 3. These results were communicated to Dr. Rory Percy at 3:33 pmon 12/3/2019by text page via the [facility] messaging system. Electronically Signed   By: Genevie Ann M.D.   On: 06/08/2018 15:34   MRI and MRA pending  TTE pending  LE venous doppler pending   PHYSICAL EXAM  Temp:  [92.4 F (33.6 C)-98.6 F (37 C)] 98.6 F (37 C) (12/04 0800) Pulse Rate:  [65-121] 87 (12/04 0930) Resp:  [15-18] 18 (12/04 0930) BP: (76-167)/(50-114) 125/82 (12/04 0930) SpO2:  [99 %-100 %] 100 % (12/04 0943) Arterial Line BP: (121-172)/(48-74) 132/54 (12/04 0930) FiO2 (%):  [30 %-40 %] 30 % (12/04 0740) Weight:  [83.9 kg] 83.9 kg (12/03 2145)  General - Well nourished, well developed, intubated on mild sedation  Ophthalmologic - fundi not visualized due to noncooperation.  Cardiovascular - Regular rate and rhythm.  Neuro - intubated but awake, eyes open, following all simple commands. PERRL, EOMI, no gaze preference, blinking to visual threat bilaterally. Facial symmetrical difficult to test due to ET tube, tongue midline. LUE and LLE 4+/5, LUE 4/5 and LLE 3+/5. Sensation symmetrical. Coordination and gait not able to test due to restriction.     ASSESSMENT/PLAN Mr. MIHAILO SAGE Sr. is a 72 y.o. male with history of CKD, COPD, anemia, HTN, obesity, OSA on CPAP, rectal cancer admitted for slurry speech, left sided weakness and found down at home. No tPA given due to outside window.    Stroke:  right MCA infarct due to right ICA and MCA occlusion s/p IR and rescue stents with TICI2b reperfusion, embolic pattern, secondary to cardioembolic source vs. Large vessel athero  Resultant intubated with left hemiparesis  CT right MCA hyperdense sign  CTA head and neck - right ICA and MCA occlusion, right VA high grade stenosis vs. Occlusion, left siphon, right P1/P2, and left VA high grade stenosis  MRI  Pending   MRA  Pending   2D Echo  Pending   LE venous doppler pending  Will consider TEE and loop for further evaluation once stable.   LDL 103  HgbA1c 5.5  Heparin subq for VTE prophylaxis  No antithrombotic prior to admission, now on aspirin 81 mg daily and brilinta due to intracranial stents  Ongoing aggressive stroke risk factor management  Therapy  recommendations:  Pending   Disposition:  Pending   Respiratory failure   Intubated for airway protection with procedure  Pt awake and following commands now  On weaning trial now  Plan for extubation today  CKD stage V  Cre 5.10->4.57->4.84  Has AVF placed recently  No HD yet PTA  Continue monitoring - no indication for HD at this time  BMP in am  Hypertension . Stable on cleviprex . BP goal 120-140 within 24h of procedure . D/c A line  Long term BP goal normotensive  Hyperlipidemia  Home meds:  none   LDL 103, goal < 70  Consider statin once po access   Other Stroke Risk Factors  Advanced age  Former Cigarette smoker, now quit  ETOH use in the past  Obstructive sleep apnea, on CPAP at home  Other Active Problems  COPD  Anemia due to CKD, Hb 12.7->11.3, continue monitoring  Rectal cancer history  Hospital day # 1  This  patient is critically ill due to right MCA infarct, right ICA and MCA occlusion s/p procedure, respiratory failure, CKD stage V, HTN and at significant risk of neurological worsening, death form recurrent stroke, stent reocclusion, renal failure, heart failure, seizure. This patient's care requires constant monitoring of vital signs, hemodynamics, respiratory and cardiac monitoring, review of multiple databases, neurological assessment, discussion with family, other specialists and medical decision making of high complexity. I spent 40 minutes of neurocritical care time in the care of this patient. I had long discussion with wife and daughter in law at bedside, updated pt current condition, treatment plan and potential prognosis. They expressed understanding and appreciation.    Rosalin Hawking, MD PhD Stroke Neurology 06/09/2018 10:03 AM    To contact Stroke Continuity provider, please refer to [email or web address] After hours, contact General Neurology

## 2018-06-09 NOTE — Progress Notes (Signed)
Pharmacy called about D/C the SQ heparin due to bleeding at the groin site today on day shift. Hemostasis achieved @ 1430. At that time SQ heparin was held for the 1400 dose. Called Dr. Lorraine Lax about the Pharmacist's concern and he felt necessary to keep the heparin. Order will not be d/c'd at this time. Will continue to monitor R groin site frequently tonight.

## 2018-06-09 NOTE — Progress Notes (Signed)
Referring Physician(s): CODE STROKE- Amie Portland  Supervising Physician: Luanne Bras  Patient Status:  [facility] - In-pt  Chief Complaint: None  Subjective:  Right ICA proximal occlusion s/p revascularization using stent assisted angioplasty 06/08/2018 by Dr. Estanislado Pandy. Right MCA and right ACA proximal occlusions s/p emergent mechanical thrombectomy and stent placement achieving a TICI 2b revascularization 06/08/2018 by Dr. Estanislado Pandy. Patient laying in bed intubated and sedated. Can lift bilateral arms off bed and bend bilateral knees. Right groin incision c/d/i.   Allergies: Nsaids and Amlodipine  Medications: Prior to Admission medications   Medication Sig Start Date End Date Taking? Authorizing Provider  acetaminophen (TYLENOL) 325 MG tablet Take 650 mg by mouth every 6 (six) hours as needed.    [provider]  allopurinol (ZYLOPRIM) 100 MG tablet Take 1 tablet (100 mg total) by mouth daily. 05/11/18   Mikhail, Velta Addison, DO  calcium carbonate (OSCAL) 1500 (600 Ca) MG TABS tablet Take 600 mg of elemental calcium by mouth daily.    [provider]  cholecalciferol (VITAMIN D) 1000 units tablet Take 5,000 Units by mouth daily.    [provider]  doxazosin (CARDURA) 8 MG tablet Take 1 tablet (8 mg total) by mouth daily. 02/05/18   [name], MD  furosemide (LASIX) 80 MG tablet Take 1 tablet (80 mg total) by mouth daily. 05/12/18   Mikhail, Velta Addison, DO  gabapentin (NEURONTIN) 300 MG capsule Take 1 capsule (300 mg total) by mouth at bedtime. Take 300mg s morning and 600mg s at bedtime 05/10/18   Mikhail, Velta Addison, DO  KLOR-CON M20 20 MEQ tablet Take 1 tablet (20 mEq total) by mouth daily. 12/11/17   [name], MD  metoprolol succinate (TOPROL-XL) 50 MG 24 hr tablet Take 50 mg by mouth daily. 04/15/18   [provider]  polycarbophil (FIBERCON) 625 MG tablet Take 1,250 mg by mouth as needed.     [provider]     Vital  Signs: BP 125/82   Pulse 87   Temp 98.6 F (37 C) (Axillary)   Resp 18   Ht 5\' 8"  (1.727 m)   Wt 184 lb 15.5 oz (83.9 kg)   SpO2 100%   BMI 28.12 kg/m   Physical Exam  Constitutional: He appears well-developed and well-nourished. No distress.  Intubated and sedated.  Pulmonary/Chest: Effort normal. No respiratory distress.  Intubated and sedated.  Neurological:  Intubated and sedated. Speech and comprehension not assessed. PERRL bilaterally. EOMs not assessed. Visual fields not assessed. Facial asymmetry not assessed. Tongue midline not assessed. Can lift bilateral arms off bed and bend bilateral knees. Pronator drift not assessed. Fine motor and coordination not assessed. Gait not assessed. Romberg not assessed. Heel to toe not assessed. Distal pulses palpable bilaterally by Doppler.  Skin: Skin is warm and dry.  Right groin incision soft without active bleeding or hematoma.  Psychiatric:  Intubated and sedated.  Nursing note and vitals reviewed.   Imaging: Ct Angio Head W Or Wo Contrast  Result Date: 06/08/2018 CLINICAL DATA:  72 year old male code stroke presentation with hyperdense right M1. History of rectal cancer with treated lung metastases. EXAM: CT ANGIOGRAPHY HEAD AND NECK CT PERFUSION BRAIN TECHNIQUE: Multidetector CT imaging of the head and neck was performed using the standard protocol during bolus administration of intravenous contrast. Multiplanar CT image reconstructions and MIPs were obtained to evaluate the vascular anatomy. Carotid stenosis measurements (when applicable) are obtained utilizing NASCET criteria, using the distal internal carotid diameter as the denominator. Multiphase  CT imaging of the brain was performed following IV bolus contrast injection. Subsequent parametric perfusion maps were calculated using RAPID software. CONTRAST:  134mL ISOVUE-370 IOPAMIDOL (ISOVUE-370) INJECTION 76% COMPARISON:  Noncontrast head CT 1521 hours today. Chest CT  12/01/2017. FINDINGS: CT Brain Perfusion Findings: CBF (<30%) Volume: 13 milliliters Perfusion (Tmax>6.0s) volume: 132 milliliters (hypoperfusion index of 0.5) Mismatch Volume: 119 milliliters (mismatch ratio of 10.2 Infarction Location:Deep right MCA territory white matter CTA NECK Skeleton: Absent dentition. No acute osseous abnormality identified. Upper chest: Stable partially calcified posterior left upper lobe lung nodule measuring 2-3 centimeters. Peripheral right upper lobe nodularity and pleural thickening also appears stable. No superior mediastinal lymphadenopathy. Other neck: Left nasal airway in place.  Otherwise negative. Aortic arch: 3 vessel arch configuration with mild for age arch atherosclerosis. Right carotid system: Negative brachiocephalic and right CCA origin. The right CCA is patent, but at the right carotid bifurcation the right ICA is abruptly occluded in the mid bulb as seen on series 10, image 69. Mild to moderate superimposed right ICA origin plaque. No reconstituted enhancement in the neck. Left carotid system: Negative left CCA origin. Mildly tortuous left CCA. Soft and calcified plaque at the left carotid bifurcation, left ICA origin and bulb resulting in less than 50 % stenosis with respect to the distal vessel. Vertebral arteries: No proximal right subclavian artery stenosis despite mild plaque. There is low-density plaque or thrombus in the right vertebral artery origin and V1 segment resulting in near occlusion (series 9, image 142),. The distal right V1 segment then resumes enhancing but there is poor enhancement throughout the right vertebral artery to the skull base. Mild plaque at the left subclavian artery origin without stenosis. More bulky soft plaque at the proximal left subclavian near the left vertebral artery origin as demonstrated on series 8, image 272. The left vertebral origin is patent, but there is evidence of some left V1 segment soft plaque and stenosis as seen  on series 9, image 160. Elsewhere in the neck the and appears normal left vertebral. Artery is patent patent distal CTA HEAD Posterior circulation: Left vertebral artery with calcified plaque resulting in mild to moderate stenosis. The right V4 segment is occluded. The basilar artery is patent although irregular throughout much of its course. There is no high-grade basilar stenosis. The SCA and PCA origins are patent. There is mild irregularity and stenosis at the left PCA origin (series 12, image 24). Posterior communicating arteries are diminutive or absent. There is contralateral moderate to severe right PCA P1/P2 junction stenosis (series 11, image 21). However, bilateral distal PCA enhancement is symmetric and within normal limits. Anterior circulation: The right ICA siphon is occluded with superimposed calcified plaque. The right ICA terminus is occluded. There is no right MCA M1 or bifurcation enhancement. There is minimal distal right MCA branch enhancement. The left ICA siphon is patent with calcified plaque resulting in moderate to severe supraclinoid segment stenosis as seen on series 10, image 107. The left ICA terminus is patent. The left MCA and ACA origins are normal. The anterior communicating artery is diminutive but patent and appears to supply the right ACA A2 and distal A1 segments. There is mild bilateral ACA irregularity. The left MCA M1 segment and left MCA bifurcation are patent without stenosis. Left MCA branches are mildly irregular. Venous sinuses: Not evaluated due to early contrast timing. Anatomic variants: None. Review of the MIP images confirms the above findings IMPRESSION: 1. Positive for LVO (occluded Right ICA, Right ICA terminus  and Right MCA) with CT Perfusion criteria favorable for endovascular reperfusion (core infarct estimated at 13 mL with mismatch volume 119 mL, mismatch ratio of 10.2). Preliminary report of this was discussed by telephone with Dr. Amie Portland on  06/08/2018 at 15:46. 2. Positive also for occluded Right Vertebral Artery and soft plaque or thrombus at the Left Vertebral Artery origin. In conjunction with #1 this might reflect sequelae of cardiac or paradoxical emboli. This was discussed with Dr. Juliet Rude in [place] at 1359 hours. 3. Superimposed intracranial atherosclerosis including moderate or severe stenosis of the: - Left ICA supraclinoid segment. - Left vertebral artery V4 segment. - Right PCA P1/P2. 4. Stable visible upper chest since the CT on 12/01/2017. Electronically Signed   By: Genevie Ann M.D.   On: 06/08/2018 16:05   Dg Chest 1 View  Result Date: 06/08/2018 CLINICAL DATA:  OG tube placement. EXAM: CHEST  1 VIEW COMPARISON:  05/08/2018 and prior radiographs FINDINGS: An endotracheal tube is identified with tip is 4 cm above the carina, and OG tube entering the stomach with tip off the field of view. UPPER limits normal heart size with pulmonary vascular congestion noted. Elevated RIGHT hemidiaphragm again identified. No pneumothorax or definite pleural effusion. IMPRESSION: UPPER limits normal heart size and pulmonary vascular congestion. Support apparatus as described. Electronically Signed   By: Margarette Canada M.D.   On: 06/08/2018 21:18   Ct Angio Neck W Or Wo Contrast  Result Date: 06/08/2018 CLINICAL DATA:  72 year old male code stroke presentation with hyperdense right M1. History of rectal cancer with treated lung metastases. EXAM: CT ANGIOGRAPHY HEAD AND NECK CT PERFUSION BRAIN TECHNIQUE: Multidetector CT imaging of the head and neck was performed using the standard protocol during bolus administration of intravenous contrast. Multiplanar CT image reconstructions and MIPs were obtained to evaluate the vascular anatomy. Carotid stenosis measurements (when applicable) are obtained utilizing NASCET criteria, using the distal internal carotid diameter as the denominator. Multiphase CT imaging of the brain was performed following IV bolus  contrast injection. Subsequent parametric perfusion maps were calculated using RAPID software. CONTRAST:  132mL ISOVUE-370 IOPAMIDOL (ISOVUE-370) INJECTION 76% COMPARISON:  Noncontrast head CT 1521 hours today. Chest CT 12/01/2017. FINDINGS: CT Brain Perfusion Findings: CBF (<30%) Volume: 13 milliliters Perfusion (Tmax>6.0s) volume: 132 milliliters (hypoperfusion index of 0.5) Mismatch Volume: 119 milliliters (mismatch ratio of 10.2 Infarction Location:Deep right MCA territory white matter CTA NECK Skeleton: Absent dentition. No acute osseous abnormality identified. Upper chest: Stable partially calcified posterior left upper lobe lung nodule measuring 2-3 centimeters. Peripheral right upper lobe nodularity and pleural thickening also appears stable. No superior mediastinal lymphadenopathy. Other neck: Left nasal airway in place.  Otherwise negative. Aortic arch: 3 vessel arch configuration with mild for age arch atherosclerosis. Right carotid system: Negative brachiocephalic and right CCA origin. The right CCA is patent, but at the right carotid bifurcation the right ICA is abruptly occluded in the mid bulb as seen on series 10, image 69. Mild to moderate superimposed right ICA origin plaque. No reconstituted enhancement in the neck. Left carotid system: Negative left CCA origin. Mildly tortuous left CCA. Soft and calcified plaque at the left carotid bifurcation, left ICA origin and bulb resulting in less than 50 % stenosis with respect to the distal vessel. Vertebral arteries: No proximal right subclavian artery stenosis despite mild plaque. There is low-density plaque or thrombus in the right vertebral artery origin and V1 segment resulting in near occlusion (series 9, image 142),. The distal right V1 segment  then resumes enhancing but there is poor enhancement throughout the right vertebral artery to the skull base. Mild plaque at the left subclavian artery origin without stenosis. More bulky soft plaque at the  proximal left subclavian near the left vertebral artery origin as demonstrated on series 8, image 272. The left vertebral origin is patent, but there is evidence of some left V1 segment soft plaque and stenosis as seen on series 9, image 160. Elsewhere in the neck the and appears normal left vertebral. Artery is patent patent distal CTA HEAD Posterior circulation: Left vertebral artery with calcified plaque resulting in mild to moderate stenosis. The right V4 segment is occluded. The basilar artery is patent although irregular throughout much of its course. There is no high-grade basilar stenosis. The SCA and PCA origins are patent. There is mild irregularity and stenosis at the left PCA origin (series 12, image 24). Posterior communicating arteries are diminutive or absent. There is contralateral moderate to severe right PCA P1/P2 junction stenosis (series 11, image 21). However, bilateral distal PCA enhancement is symmetric and within normal limits. Anterior circulation: The right ICA siphon is occluded with superimposed calcified plaque. The right ICA terminus is occluded. There is no right MCA M1 or bifurcation enhancement. There is minimal distal right MCA branch enhancement. The left ICA siphon is patent with calcified plaque resulting in moderate to severe supraclinoid segment stenosis as seen on series 10, image 107. The left ICA terminus is patent. The left MCA and ACA origins are normal. The anterior communicating artery is diminutive but patent and appears to supply the right ACA A2 and distal A1 segments. There is mild bilateral ACA irregularity. The left MCA M1 segment and left MCA bifurcation are patent without stenosis. Left MCA branches are mildly irregular. Venous sinuses: Not evaluated due to early contrast timing. Anatomic variants: None. Review of the MIP images confirms the above findings IMPRESSION: 1. Positive for LVO (occluded Right ICA, Right ICA terminus and Right MCA) with CT Perfusion  criteria favorable for endovascular reperfusion (core infarct estimated at 13 mL with mismatch volume 119 mL, mismatch ratio of 10.2). Preliminary report of this was discussed by telephone with Dr. Amie Portland on 06/08/2018 at 15:46. 2. Positive also for occluded Right Vertebral Artery and soft plaque or thrombus at the Left Vertebral Artery origin. In conjunction with #1 this might reflect sequelae of cardiac or paradoxical emboli. This was discussed with Dr. Juliet Rude in [place] at 1359 hours. 3. Superimposed intracranial atherosclerosis including moderate or severe stenosis of the: - Left ICA supraclinoid segment. - Left vertebral artery V4 segment. - Right PCA P1/P2. 4. Stable visible upper chest since the CT on 12/01/2017. Electronically Signed   By: Genevie Ann M.D.   On: 06/08/2018 16:05   Ct Cerebral Perfusion W Contrast  Result Date: 06/08/2018 CLINICAL DATA:  72 year old male code stroke presentation with hyperdense right M1. History of rectal cancer with treated lung metastases. EXAM: CT ANGIOGRAPHY HEAD AND NECK CT PERFUSION BRAIN TECHNIQUE: Multidetector CT imaging of the head and neck was performed using the standard protocol during bolus administration of intravenous contrast. Multiplanar CT image reconstructions and MIPs were obtained to evaluate the vascular anatomy. Carotid stenosis measurements (when applicable) are obtained utilizing NASCET criteria, using the distal internal carotid diameter as the denominator. Multiphase CT imaging of the brain was performed following IV bolus contrast injection. Subsequent parametric perfusion maps were calculated using RAPID software. CONTRAST:  15mL ISOVUE-370 IOPAMIDOL (ISOVUE-370) INJECTION 76% COMPARISON:  Noncontrast head  CT 1521 hours today. Chest CT 12/01/2017. FINDINGS: CT Brain Perfusion Findings: CBF (<30%) Volume: 13 milliliters Perfusion (Tmax>6.0s) volume: 132 milliliters (hypoperfusion index of 0.5) Mismatch Volume: 119 milliliters (mismatch  ratio of 10.2 Infarction Location:Deep right MCA territory white matter CTA NECK Skeleton: Absent dentition. No acute osseous abnormality identified. Upper chest: Stable partially calcified posterior left upper lobe lung nodule measuring 2-3 centimeters. Peripheral right upper lobe nodularity and pleural thickening also appears stable. No superior mediastinal lymphadenopathy. Other neck: Left nasal airway in place.  Otherwise negative. Aortic arch: 3 vessel arch configuration with mild for age arch atherosclerosis. Right carotid system: Negative brachiocephalic and right CCA origin. The right CCA is patent, but at the right carotid bifurcation the right ICA is abruptly occluded in the mid bulb as seen on series 10, image 69. Mild to moderate superimposed right ICA origin plaque. No reconstituted enhancement in the neck. Left carotid system: Negative left CCA origin. Mildly tortuous left CCA. Soft and calcified plaque at the left carotid bifurcation, left ICA origin and bulb resulting in less than 50 % stenosis with respect to the distal vessel. Vertebral arteries: No proximal right subclavian artery stenosis despite mild plaque. There is low-density plaque or thrombus in the right vertebral artery origin and V1 segment resulting in near occlusion (series 9, image 142),. The distal right V1 segment then resumes enhancing but there is poor enhancement throughout the right vertebral artery to the skull base. Mild plaque at the left subclavian artery origin without stenosis. More bulky soft plaque at the proximal left subclavian near the left vertebral artery origin as demonstrated on series 8, image 272. The left vertebral origin is patent, but there is evidence of some left V1 segment soft plaque and stenosis as seen on series 9, image 160. Elsewhere in the neck the and appears normal left vertebral. Artery is patent patent distal CTA HEAD Posterior circulation: Left vertebral artery with calcified plaque resulting in  mild to moderate stenosis. The right V4 segment is occluded. The basilar artery is patent although irregular throughout much of its course. There is no high-grade basilar stenosis. The SCA and PCA origins are patent. There is mild irregularity and stenosis at the left PCA origin (series 12, image 24). Posterior communicating arteries are diminutive or absent. There is contralateral moderate to severe right PCA P1/P2 junction stenosis (series 11, image 21). However, bilateral distal PCA enhancement is symmetric and within normal limits. Anterior circulation: The right ICA siphon is occluded with superimposed calcified plaque. The right ICA terminus is occluded. There is no right MCA M1 or bifurcation enhancement. There is minimal distal right MCA branch enhancement. The left ICA siphon is patent with calcified plaque resulting in moderate to severe supraclinoid segment stenosis as seen on series 10, image 107. The left ICA terminus is patent. The left MCA and ACA origins are normal. The anterior communicating artery is diminutive but patent and appears to supply the right ACA A2 and distal A1 segments. There is mild bilateral ACA irregularity. The left MCA M1 segment and left MCA bifurcation are patent without stenosis. Left MCA branches are mildly irregular. Venous sinuses: Not evaluated due to early contrast timing. Anatomic variants: None. Review of the MIP images confirms the above findings IMPRESSION: 1. Positive for LVO (occluded Right ICA, Right ICA terminus and Right MCA) with CT Perfusion criteria favorable for endovascular reperfusion (core infarct estimated at 13 mL with mismatch volume 119 mL, mismatch ratio of 10.2). Preliminary report of this was discussed by  telephone with Dr. Amie Portland on 06/08/2018 at 15:46. 2. Positive also for occluded Right Vertebral Artery and soft plaque or thrombus at the Left Vertebral Artery origin. In conjunction with #1 this might reflect sequelae of cardiac or  paradoxical emboli. This was discussed with Dr. Juliet Rude in [place] at 1359 hours. 3. Superimposed intracranial atherosclerosis including moderate or severe stenosis of the: - Left ICA supraclinoid segment. - Left vertebral artery V4 segment. - Right PCA P1/P2. 4. Stable visible upper chest since the CT on 12/01/2017. Electronically Signed   By: Genevie Ann M.D.   On: 06/08/2018 16:05   Dg Abd Portable 1v  Result Date: 06/08/2018 CLINICAL DATA:  OG tube placement. EXAM: PORTABLE ABDOMEN - 1 VIEW COMPARISON:  None. FINDINGS: An OG tube is identified with tip overlying the distal stomach/proximal duodenum. Bowel gas pattern is unremarkable. IMPRESSION: OG tube with tip overlying the distal stomach/proximal duodenum. Electronically Signed   By: Margarette Canada M.D.   On: 06/08/2018 21:19   Ct Head Code Stroke Wo Contrast  Result Date: 06/08/2018 CLINICAL DATA:  Code stroke.  72 year old male. Rightward gaze. EXAM: CT HEAD WITHOUT CONTRAST TECHNIQUE: Contiguous axial images were obtained from the base of the skull through the vertex without intravenous contrast. COMPARISON:  None. FINDINGS: Brain: Asymmetric loss of gray-white matter differentiation at the right insula on series 3, image 17. No other cytotoxic edema identified in the right MCA territory. No midline shift, ventriculomegaly, mass effect, evidence of mass lesion, intracranial hemorrhage. Patchy bilateral white matter hypodensity. Vascular: Calcified atherosclerosis at the skull base. Hyperdense right MCA M1 segment as seen on series 5, image 26. Skull: No acute osseous abnormality identified. Sinuses/Orbits: left nasal airway. Paranasal sinuses are well pneumatized, mild right maxillary mucosal thickening. Mild right mastoid effusion. Tympanic cavities remain clear. Other: Mild rightward gaze deviation. No other acute orbit or scalp soft tissue findings. ASPECTS [facility] Stroke Program Early CT Score) - Ganglionic level infarction (caudate, lentiform  nuclei, internal capsule, insula, M1-M3 cortex): 6 - Supraganglionic infarction (M4-M6 cortex): Total score (0-10 with 10 being normal): IMPRESSION: 1. Hyperdense Right MCA M1 segment compatible with ELVO. Subtle cytotoxic edema suspected in the right insula, ASPECTS is 9. 2. Negative for bleed. 3. These results were communicated to Dr. Rory Percy at 3:33 pmon 12/3/2019by text page via the [facility] messaging system. Electronically Signed   By: Genevie Ann M.D.   On: 06/08/2018 15:34    Labs:  CBC: Recent Labs    05/09/18 0536 05/17/18 1342 06/08/18 1514 06/08/18 1526 06/09/18 0454  WBC 5.3 5.8 8.3  --  9.1  HGB 11.7* 12.1* 12.7* 14.3 11.3*  HCT 36.1* 37.1* 39.9 42.0 34.3*  PLT 117* 181.0 153  --  149*    COAGS: Recent Labs    06/08/18 1514  INR 1.06  APTT 29    BMP: Recent Labs    05/10/18 0524 05/17/18 1342 06/08/18 1514 06/08/18 1526 06/08/18 2156 06/09/18 0454  NA 134* 139 137 138 134* 138  K 3.5 3.9 3.4* 3.4* 3.9 3.8  CL 101 100 103 107 101 105  CO2 25 29 20*  --  20* 22  GLUCOSE 86 86 116* 114* 193* 76  BUN 106* 77* 63* 54* 58* 58*  CALCIUM 10.0 10.8* 10.5*  --  9.8 9.4  CREATININE 5.87* 5.06* 4.95* 5.10* 4.57* 4.84*  GFRNONAA 9*  --  11*  --  12* 11*  GFRAA 10*  --  13*  --  14* 13*  LIVER FUNCTION TESTS: Recent Labs    05/08/18 1022 06/08/18 1514  BILITOT 0.8 0.9  AST 39 18  ALT 30 15  ALKPHOS 108 87  PROT 5.9* 6.7  ALBUMIN 3.5 4.0    Assessment and Plan:  Right ICA proximal occlusion s/p revascularization using stent assisted angioplasty 06/08/2018 by Dr. Estanislado Pandy. Right MCA and right ACA proximal occlusions s/p emergent mechanical thrombectomy and stent placement achieving a TICI 2b revascularization 06/08/2018 by Dr. Estanislado Pandy. Patient's condition improving- remains intubated and sedated but can lift bilateral arms off bed and bend bilateral knees. Right groin incision stable. Appreciate and agree with neurology and CCM management. IR to  follow.   Electronically Signed: Earley Abide, PA-C 06/09/2018, 9:44 AM   I spent a total of 15 Minutes at the the patient's bedside AND on the patient's hospital floor or unit, greater than 50% of which was counseling/coordinating care for right ICA proximal, right MCA, and right ACA proximal s/p revascularization.

## 2018-06-09 NOTE — Progress Notes (Signed)
Was on 4N with Dr. Estanislado Pandy attending to another patient when RN requested we come to evaluate patient's right groin bleed.  Upon entrance, patient's bed saturated with blood from right groin site. Hardness palpable at inferior/lateral aspect of incision site. RN held pressure and v-pad was applied. Hemostasis achieved at 1430 and pressure dressing applied. Site soft.  Patient to remain flat with right leg straight for 4 hours.  Bea Graff Bradden Tadros, PA-C 06/09/2018, 3:46 PM

## 2018-06-10 ENCOUNTER — Inpatient Hospital Stay (HOSPITAL_COMMUNITY): Payer: Medicare Other

## 2018-06-10 DIAGNOSIS — I6601 Occlusion and stenosis of right middle cerebral artery: Secondary | ICD-10-CM

## 2018-06-10 DIAGNOSIS — I5020 Unspecified systolic (congestive) heart failure: Secondary | ICD-10-CM

## 2018-06-10 DIAGNOSIS — G8194 Hemiplegia, unspecified affecting left nondominant side: Secondary | ICD-10-CM

## 2018-06-10 DIAGNOSIS — N184 Chronic kidney disease, stage 4 (severe): Secondary | ICD-10-CM

## 2018-06-10 DIAGNOSIS — I679 Cerebrovascular disease, unspecified: Secondary | ICD-10-CM

## 2018-06-10 DIAGNOSIS — D72829 Elevated white blood cell count, unspecified: Secondary | ICD-10-CM

## 2018-06-10 DIAGNOSIS — D631 Anemia in chronic kidney disease: Secondary | ICD-10-CM

## 2018-06-10 LAB — CBC
HCT: 25.3 % — ABNORMAL LOW (ref 39.0–52.0)
Hemoglobin: 8.1 g/dL — ABNORMAL LOW (ref 13.0–17.0)
MCH: 27.5 pg (ref 26.0–34.0)
MCHC: 32 g/dL (ref 30.0–36.0)
MCV: 85.8 fL (ref 80.0–100.0)
PLATELETS: 131 10*3/uL — AB (ref 150–400)
RBC: 2.95 MIL/uL — AB (ref 4.22–5.81)
RDW: 18.9 % — ABNORMAL HIGH (ref 11.5–15.5)
WBC: 12.2 10*3/uL — ABNORMAL HIGH (ref 4.0–10.5)
nRBC: 0 % (ref 0.0–0.2)

## 2018-06-10 LAB — BASIC METABOLIC PANEL
Anion gap: 14 (ref 5–15)
BUN: 71 mg/dL — ABNORMAL HIGH (ref 8–23)
CO2: 20 mmol/L — ABNORMAL LOW (ref 22–32)
Calcium: 9.2 mg/dL (ref 8.9–10.3)
Chloride: 101 mmol/L (ref 98–111)
Creatinine, Ser: 6.69 mg/dL — ABNORMAL HIGH (ref 0.61–1.24)
GFR calc Af Amer: 9 mL/min — ABNORMAL LOW (ref >60)
GFR calc non Af Amer: 8 mL/min — ABNORMAL LOW (ref >60)
Glucose, Bld: 96 mg/dL (ref 70–99)
Potassium: 4 mmol/L (ref 3.5–5.1)
Sodium: 135 mmol/L (ref 135–145)

## 2018-06-10 MED ORDER — VITAMIN D 25 MCG (1000 UNIT) PO TABS
5000.0000 [IU] | ORAL_TABLET | Freq: Every day | ORAL | Status: DC
  Administered 2018-06-10 – 2018-06-16 (×7): 5000 [IU] via ORAL
  Filled 2018-06-10 (×7): qty 5

## 2018-06-10 MED ORDER — CALCIUM CARBONATE 1500 (600 CA) MG PO TABS: 600.0000 mg | ORAL_TABLET | Freq: Every day | ORAL | Status: DC

## 2018-06-10 MED ORDER — ATORVASTATIN CALCIUM 10 MG PO TABS
20.0000 mg | ORAL_TABLET | Freq: Every day | ORAL | Status: DC
  Administered 2018-06-10 – 2018-06-16 (×7): 20 mg via ORAL
  Filled 2018-06-10 (×7): qty 2

## 2018-06-10 MED ORDER — CALCIUM CARBONATE 1250 (500 CA) MG PO TABS
1.0000 | ORAL_TABLET | Freq: Every day | ORAL | Status: DC
  Administered 2018-06-11 – 2018-06-16 (×6): 500 mg via ORAL
  Filled 2018-06-10 (×6): qty 1

## 2018-06-10 MED ORDER — ORAL CARE MOUTH RINSE
15.0000 mL | Freq: Two times a day (BID) | OROMUCOSAL | Status: DC
  Administered 2018-06-10 – 2018-06-15 (×7): 15 mL via OROMUCOSAL

## 2018-06-10 MED ORDER — PANTOPRAZOLE SODIUM 40 MG PO TBEC
40.0000 mg | DELAYED_RELEASE_TABLET | Freq: Every day | ORAL | Status: DC
  Administered 2018-06-11 – 2018-06-16 (×6): 40 mg via ORAL
  Filled 2018-06-10 (×6): qty 1

## 2018-06-10 MED ORDER — CHLORHEXIDINE GLUCONATE 0.12 % MT SOLN
15.0000 mL | Freq: Two times a day (BID) | OROMUCOSAL | Status: DC
  Administered 2018-06-10 – 2018-06-16 (×13): 15 mL via OROMUCOSAL
  Filled 2018-06-10 (×13): qty 15

## 2018-06-10 MED ORDER — DOXAZOSIN MESYLATE 8 MG PO TABS
8.0000 mg | ORAL_TABLET | Freq: Every day | ORAL | Status: DC
  Administered 2018-06-10 – 2018-06-16 (×7): 8 mg via ORAL
  Filled 2018-06-10 (×7): qty 1

## 2018-06-10 MED ORDER — ALLOPURINOL 100 MG PO TABS
100.0000 mg | ORAL_TABLET | Freq: Every day | ORAL | Status: DC
  Administered 2018-06-10 – 2018-06-16 (×7): 100 mg via ORAL
  Filled 2018-06-10 (×7): qty 1

## 2018-06-10 NOTE — Evaluation (Signed)
Speech Language Pathology Evaluation Patient Details Name: Jason [name] Sr. MRN: [id number] DOB: [birth year] Today's Date: 06/10/2018 Time: 0160-1093 SLP Time Calculation (min) (ACUTE ONLY): 50 min  Problem List:  Patient Active Problem List   Diagnosis Date Noted  . Acute respiratory insufficiency   . Chronic systolic (congestive) heart failure ([place])   . Acute ischemic stroke ([place]) 06/08/2018  . Middle cerebral artery embolism, right 06/08/2018  . CHF (congestive heart failure) ([place]) 05/08/2018  . Normocytic anemia 05/08/2018  . Hyperlipidemia LDL goal <130 09/23/2017  . OAB (overactive bladder) 01/09/2016  . SUI (stress urinary incontinence), male 01/09/2016  . Hypersomnia 10/30/2015  . Obesity (BMI 30.0-34.9) 10/30/2015  . Routine general medical examination at a health care facility 06/04/2015  . Medicare annual wellness visit, subsequent 06/04/2015  . Hereditary and idiopathic peripheral neuropathy 09/14/2014  . Rectal cancer ([place]) 02/01/2013  . Chronic renal insufficiency 07/31/2011  . Obstructive sleep apnea 07/29/2010  . GOUT 05/11/2007  . ERECTILE DYSFUNCTION, MILD 05/11/2007  . Essential hypertension 05/11/2007   Past Medical History:  Past Medical History:  Diagnosis Date  . Anemia   . Arthritis   . Chronic renal insufficiency 07/31/2011  . COPD (chronic obstructive pulmonary disease) ([place])    pt denies  . Deficiency anemia 07/20/2012  . Diverticulosis   . Edema 09/21/2010   Qualifier: Diagnosis of  By: Jerold Coombe    . ERECTILE DYSFUNCTION, MILD 05/11/2007   Qualifier: Diagnosis of  By: Sherren Mocha MD, Jory Ee   . Exertional dyspnea 10/30/2015  . GOUT 05/11/2007   Qualifier: Diagnosis of  By: Sherren Mocha MD, Jory Ee   . Hereditary and idiopathic peripheral neuropathy 09/14/2014  . History of radiation therapy 02/21/13-03/31/13   rectum 50.4Gy total dose  . Hypersomnia 10/30/2015  . Hypertension   . Lung nodule 10/30/2015  . OAB (overactive bladder) 01/09/2016  . Obesity  (BMI 30.0-34.9) 10/30/2015  . Obstructive sleep apnea 07/29/2010   HST 11/2015 AHI 54.  On autocpap  >> 10 cm   . Peripheral edema 09/19/2013  . rectal ca dx'd 02/01/13   rectal. Radiation and chemotherapy- remains on chemotherapy-next tx. 08-22-13.   . S/P partial lobectomy of lung 11/20/2015  . S/P thoracotomy   . SUI (stress urinary incontinence), male 01/09/2016   Past Surgical History:  Past Surgical History:  Procedure Laterality Date  . AV FISTULA PLACEMENT Left 03/24/2016   Procedure: CREATION OF LEFT BRACIOCEPHALIC ARTERIOVENOUS (AV) FISTULA;  Surgeon: Elam Dutch, MD;  Location: [facility];  Service: Vascular;  Laterality: Left;  . COLON SURGERY  05/20/2013  . COLONOSCOPY  2010   [facility]  . EUS N/A 02/03/2013   Procedure: LOWER ENDOSCOPIC ULTRASOUND (EUS);  Surgeon: Milus Banister, MD;  Location: [facility];  Service: Endoscopy;  Laterality: N/A;  . ILEOSTOMY CLOSURE N/A 08/24/2013   Procedure: CLOSURE OF LOOP ILEOSTOMY ;  Surgeon: Leighton Ruff, MD;  Location: [facility];  Service: General;  Laterality: N/A;  . LAPAROSCOPIC LOW ANTERIOR RESECTION N/A 05/20/2013   Procedure: LAPAROSCOPIC LOW ANTERIOR RESECTION WITH SPLENIC FLEXURE MOBILIZATION;  Surgeon: Leighton Ruff, MD;  Location: [facility];  Service: General;  Laterality: N/A;  . OSTOMY N/A 05/20/2013   Procedure: diverting OSTOMY;  Surgeon: Leighton Ruff, MD;  Location: [facility];  Service: General;  Laterality: N/A;  . RADIOLOGY WITH ANESTHESIA N/A 06/08/2018   Procedure: IR WITH ANESTHESIA;  Surgeon: Radiologist, Medication, MD;  Location: [facility];  Service: Radiology;  Laterality: N/A;  . VIDEO ASSISTED THORACOSCOPY (  VATS)/WEDGE RESECTION Left 11/20/2015   Procedure: VIDEO ASSISTED THORACOSCOPY (VATS)/LUNG RESECTION;  Surgeon: [name], MD;  Location: [place];  Service: Thoracic;  Laterality: Left;  Marland Kitchen VIDEO BRONCHOSCOPY N/A 11/20/2015   Procedure: VIDEO BRONCHOSCOPY;  Surgeon: [name], MD;  [facility];  Service:  Thoracic;  Laterality: N/A;   HPI:  72 y.o. male with history of CKD, COPD, anemia, HTN, obesity, OSA on CPAP, rectal cancer found down at home and admitted with dysarthric speech, left sided weakness.  Dx right ICA/MCA occlusion, s/p IR and stents. Intubated for procedure, extubated next day 12/4.     Assessment / Plan / Recommendation Clinical Impression  Pt demonstrates moderate dysarthria due to left CN VII anc XII weakness. He needs verbal cues to increase volume and overarticulate for improved intelligibility. Pt also demonstrates mild cognitive deficits what appear to fluctuate. He is oriented but then asks about passing him a water from the refrigerator and points to the bathroom. RN confirms this type of behavior as well. With structured cognitive tasks he exhibits relatively good memory, but poor awareness, planning and attention to detail. During a meal however, he is quite conscitentious of deficits with self feeding and problem solving. Pt will benefit from intensive rehabilitation to address improved safety awareness and independence with ADLs.     SLP Assessment  SLP Recommendation/Assessment: Patient needs continued Speech Lanaguage Pathology Services SLP Visit Diagnosis: Dysarthria and anarthria (R47.1)    Follow Up Recommendations  Inpatient Rehab    Frequency and Duration min 2x/week  2 weeks      SLP Evaluation Cognition  Overall Cognitive Status: Impaired/Different from baseline Arousal/Alertness: Awake/alert Orientation Level: Oriented to person;Oriented to place;Disoriented to time;Oriented to situation Attention: Focused;Sustained;Selective Focused Attention: Appears intact Sustained Attention: Appears intact Selective Attention: Appears intact Memory: Impaired Memory Impairment: Decreased recall of new information Awareness: Impaired Awareness Impairment: Emergent impairment Problem Solving: Impaired Problem Solving Impairment: Verbal complex;Functional  complex Executive Function: Reasoning Reasoning: Impaired Reasoning Impairment: Verbal basic Behaviors: Impulsive Safety/Judgment: Appears intact       Comprehension  Auditory Comprehension Overall Auditory Comprehension: Appears within functional limits for tasks assessed    Expression Verbal Expression Overall Verbal Expression: Appears within functional limits for tasks assessed Written Expression Dominant Hand: Right Written Expression: Not tested   Oral / Motor  Oral Motor/Sensory Function Overall Oral Motor/Sensory Function: Mild impairment Facial ROM: Reduced left;Suspected CN VII (facial) dysfunction Facial Symmetry: Abnormal symmetry left;Suspected CN VII (facial) dysfunction Facial Strength: Reduced left;Suspected CN VII (facial) dysfunction Facial Sensation: Within Functional Limits Lingual ROM: Reduced left;Suspected CN XII (hypoglossal) dysfunction Lingual Symmetry: Abnormal symmetry left;Suspected CN XII (hypoglossal) dysfunction Lingual Strength: Reduced Lingual Sensation: Within Functional Limits Motor Speech Overall Motor Speech: Impaired Respiration: Within functional limits Phonation: Normal Resonance: Within functional limits Articulation: Impaired Level of Impairment: Word Intelligibility: Intelligibility reduced Word: 50-74% accurate Phrase: 50-74% accurate Sentence: 50-74% accurate Conversation: 50-74% accurate Motor Planning: Witnin functional limits Motor Speech Errors: Aware;Consistent Effective Techniques: Increased vocal intensity;Over-articulate   GO                   Herbie Baltimore, MA [place] Pager [phone] Office [phone]  Lynann Beaver 06/10/2018, 11:34 AM

## 2018-06-10 NOTE — Progress Notes (Addendum)
NAME:  Jason Mckeehan., MRN:  [id number], DOB:  [birth year], LOS: 2 ADMISSION DATE:  06/08/2018, CONSULTATION DATE:  12/3 REFERRING MD:  Dr. Rory Percy, CHIEF COMPLAINT:  CVA   Brief History   72 year old male with complicated medical history admitted 12/3 with acute CVA and was taken to IR for mechanical revascularization.  Postoperatively he remained on the ventilator.  History of present illness   Patient is encephalopathic and/or intubated. Therefore history has been obtained from chart review.  72 year old male with past medical history as below, which is significant for chronic kidney disease, CHF, obstructive sleep apnea, rectal cancer status post chemotherapy and radiation (2014).  He was last known well December 3 at about 5:30 AM when his wife left for work.  Later that day she was able to contact him and got home to find him with slurred speech and unable to move his left side.  He was able to follow commands.  He was evaluated by neurology immediately upon arrival to the emergency department with concern for CVA.  CT of the head was negative for acute hemorrhage.  He was outside the time window for TPA, but was able to be taken to IR based on CTA and CTP.  He underwent endovascular revascularization of the right ICA, MCA, and ACA.  Postoperatively he was transferred to the ICU on the ventilator machine.  PCCM was asked to assist with ventilator and blood pressure control.  Past Medical History   has a past medical history of Anemia, Arthritis, Chronic renal insufficiency (07/31/2011), COPD (chronic obstructive pulmonary disease) ([place]), Deficiency anemia (07/20/2012), Diverticulosis, Edema (09/21/2010), ERECTILE DYSFUNCTION, MILD (05/11/2007), Exertional dyspnea (10/30/2015), GOUT (05/11/2007), Hereditary and idiopathic peripheral neuropathy (09/14/2014), History of radiation therapy (02/21/13-03/31/13), Hypersomnia (10/30/2015), Hypertension, Lung nodule (10/30/2015), OAB (overactive bladder) (01/09/2016),  Obesity (BMI 30.0-34.9) (10/30/2015), Obstructive sleep apnea (07/29/2010), Peripheral edema (09/19/2013), rectal ca (dx'd 02/01/13), S/P partial lobectomy of lung (11/20/2015), S/P thoracotomy, and SUI (stress urinary incontinence), male (01/09/2016).   Significant Hospital Events   12/3 > admit with CVA, taken to IR for revascularization  Consults:    Procedures:  12/3: neuro IR for revascularization of acutely occluded Rt ICA peox with stent assisted angioplasty and of the RT MCA  And  RT ACA prox with x 2 passes with 35mm x 33 mm emboptrap , and x 1 pass with 71mm x 40 mm solitaire X retriever device achieving a TICI 2A  Revascularization ,and placement of a 33mm x 33mm atlas neuroform stent achieving a TICI 2b revascularization. Extubation 06/09/2018  Significant Diagnostic Tests:  CT head 12/3 > Hyperdense Right MCA M1 segment compatible with ELVO. Subtle cytotoxic edema suspected in the right insula. Negative for bleed. CTA and perfusion head 12/3 > Positive for LVO (occluded Right ICA, Right ICA terminus and Right MCA) with CT Perfusion criteria favorable for endovascular reperfusion. Positive also for occluded Right Vertebral Artery and soft plaque or thrombus at the Left Vertebral Artery origin. In conjunction with #1 this might reflect sequelae of cardiac or paradoxical emboli. Superimposed intracranial atherosclerosis including moderate or severe stenosis of the left ICA supraclinoid segment, Left vertebral artery V4 segment, right PCA P1/P2.  Micro Data:    Antimicrobials:   Periop cefazolin.   Interim history/subjective:    Objective   Blood pressure (!) 111/52, pulse 81, temperature 97.9 F (36.6 C), temperature source Oral, resp. rate 15, height 5\' 8"  (1.727 m), weight 83.9 kg, SpO2 100 %.        Intake/Output  Summary (Last 24 hours) at 06/10/2018 0925 Last data filed at 06/10/2018 0600 Gross per 24 hour  Intake 511.12 ml  Output 250 ml  Net 261.12 ml   Filed Weights    06/08/18 2145  Weight: 83.9 kg    Examination: General: 72 year old male is awake alert follows commands HEENT: Edentulous, facial droop Neuro: Left drift noted, moves all extremities x4 CV: Heart sounds are regular PULM: Auscultation clear [place]:HCWC, non-tender, bsx4 active  Extremities: warm/dry, negative edema  Skin: no rashes or lesions   Resolved Hospital Problem list     Assessment & Plan:   Acute CVA: S/p endovascular recanalization on 12/3. Location of multiple strokes concerning for embolic source either carotid or cardiac.  -Management per neuro -Blood pressure control with goal 3/76/2831 systolic -Continue neurochecks   Post op respiratory failure: supposedly he was left intubated because of his history of pulmonary hypertension, which I am unable to identify in the medical record.  -Status post 24 hours extubation -No acute respiratory issues  Chronic HFpEF: Patient of Dr. Virgina Jock. LVEF 20%.  -Echo pending -Okay to resume home medications  Severe pulmonary hypertension - Echo pending - Take care with IV fluids.   CKD: has fistula placed, but has not used yet. Creatinine has been in the 5-6 range PTA. At risk of worsening after dye load.  -Creatinine currently 6.69 up from 4.84  OSA: patient of RA. On CPAP 10cmH20 -Resume CPAP post extubation  Hypokalemia, resolved 06/10/2018 -Monitor electrolyte  HTN: -Holding home furosemide and Cardura restart per primary.   Best practice:  Diet: Full liquid diet Pain/Anxiety/Delirium protocol (if indicated): None indicated VAP protocol (if indicated): None indicated DVT prophylaxis: SCDs GI prophylaxis: Protonix Glucose control: monitor Mobility: Advance as tolerated Code Status: FULL Family Communication:  Disposition: Okay from pulmonary critical care standpoint transfer out of intensive care 06/10/2018 pulmonary critical care signing off.  Labs   CBC: Recent Labs  Lab 06/08/18 1514 06/08/18 1526  06/09/18 0454 06/09/18 1459 06/10/18 0320  WBC 8.3  --  9.1 13.6* 12.2*  NEUTROABS 6.8  --  7.6  --   --   HGB 12.7* 14.3 11.3* 9.5* 8.1*  HCT 39.9 42.0 34.3* 30.0* 25.3*  MCV 87.9  --  84.3 86.2 85.8  PLT 153  --  149* 148* 131*    Basic Metabolic Panel: Recent Labs  Lab 06/08/18 1514 06/08/18 1526 06/08/18 2156 06/09/18 0454 06/10/18 0320  NA 137 138 134* 138 135  K 3.4* 3.4* 3.9 3.8 4.0  CL 103 107 101 105 101  CO2 20*  --  20* 22 20*  GLUCOSE 116* 114* 193* 76 96  BUN 63* 54* 58* 58* 71*  CREATININE 4.95* 5.10* 4.57* 4.84* 6.69*  CALCIUM 10.5*  --  9.8 9.4 9.2   GFR: Estimated Creatinine Clearance: 10.7 mL/min (A) (by C-G formula based on SCr of 6.69 mg/dL (H)). Recent Labs  Lab 06/08/18 1514 06/09/18 0454 06/09/18 1459 06/10/18 0320  WBC 8.3 9.1 13.6* 12.2*    Liver Function Tests: Recent Labs  Lab 06/08/18 1514  AST 18  ALT 15  ALKPHOS 87  BILITOT 0.9  PROT 6.7  ALBUMIN 4.0   No results for input(s): LIPASE, AMYLASE in the last 168 hours. No results for input(s): AMMONIA in the last 168 hours.  ABG    Component Value Date/Time   PHART 7.380 06/08/2018 2258   PCO2ART 34.6 06/08/2018 2258   PO2ART 105.0 06/08/2018 2258   HCO3 20.5 06/08/2018 2258  TCO2 22 06/08/2018 2258   ACIDBASEDEF 4.0 (H) 06/08/2018 2258   O2SAT 98.0 06/08/2018 2258     Coagulation Profile: Recent Labs  Lab 06/08/18 1514  INR 1.06    Cardiac Enzymes: No results for input(s): CKTOTAL, CKMB, CKMBINDEX, TROPONINI in the last 168 hours.  HbA1C: Hgb A1c MFr Bld  Date/Time Value Ref Range Status  06/09/2018 04:30 AM 5.5 4.8 - 5.6 % Final    Comment:    (NOTE) Pre diabetes:          5.7%-6.4% Diabetes:              >6.4% Glycemic control for   <7.0% adults with diabetes   12/20/2009 12:36 PM 6.0 4.6 - 6.5 % Final    Comment:    See lab report for associated comment(s)    CBG: Recent Labs  Lab 06/08/18 1515 06/09/18 0812  GLUCAP 102* 103*      Critical care time:      Jason [name] Jason [name] ACNP Maryanna Shape PCCM Pager [phone] till 1 pm If no answer page [phone] 06/10/2018, 9:25 AM

## 2018-06-10 NOTE — Progress Notes (Signed)
Occupational Therapy Evaluation Patient Details Name: Jason [name] Sr. MRN: [id number] DOB: 02/17/1946 Today's Date: 06/10/2018    History of Present Illness 72 year old male with rectal cancer status post chemotherapy and radiation, CKD stage V, OSA who presented with slurred speech and left-sided weakness and found with right MCA infarct secondary to right ICA and MCA occlusion status post stent and reperfusion.     Clinical Impression   PTA, pt lived at home with wife and was independent with ADL and mobility. Pt currently requires +2 Mod A with mobility and Min to  Max A with ADL tasks as noted below due to deficits. Recommend rehab at [place]. I have discussed the patient's current level of function related to recommendations with the patient (wife not present) and heacknowledges understanding of this. He is interested in post-acute rehab in an inpatient setting.  Will follow acutely.       Follow Up Recommendations  CIR;Supervision/Assistance - 24 hour    Equipment Recommendations  3 in 1 bedside commode;Tub/shower bench    Recommendations for Other Services Rehab consult     Precautions / Restrictions Precautions Precautions: Fall Precaution Comments: L inattention      Mobility Bed Mobility Overal bed mobility: Needs Assistance Bed Mobility: Supine to Sit     Supine to sit: Mod assist        Transfers Overall transfer level: Needs assistance   Transfers: Sit to/from Stand;Stand Pivot Transfers Sit to Stand: +2 physical assistance;Mod assist Stand pivot transfers: Mod assist;+2 physical assistance       General transfer comment: Difficulty taking steps with LLE but did not appear to buckle    Balance Overall balance assessment: Needs assistance   Sitting balance-Leahy Scale: Poor Sitting balance - Comments: initially posteiror lean; difficulty maintaining midline postural control; gradually demosntrating increased control     Standing balance-Leahy Scale:  Poor Standing balance comment: reliant on external support                           ADL either performed or assessed with clinical judgement   ADL Overall ADL's : Needs assistance/impaired Eating/Feeding: Minimal assistance Eating/Feeding Details (indicate cue type and reason): spillage form L side aof mouth; cues for safety - pt tipping soup bowl up and drinkin gfrom bowl with spontaneous couhging and pt being unaware of soup spiiling from L side of mouth Grooming: Minimal assistance;Sitting   Upper Body Bathing: Minimal assistance;Sitting   Lower Body Bathing: Moderate assistance;Sit to/from stand   Upper Body Dressing : Moderate assistance;Sitting   Lower Body Dressing: Maximal assistance;Sit to/from stand   Toilet Transfer: +2 for physical assistance;Moderate assistance;Stand-pivot;BSC   Toileting- Clothing Manipulation and Hygiene: Maximal assistance;Sit to/from stand       Functional mobility during ADLs: Moderate assistance;+2 for physical assistance       Vision Baseline Vision/History: Wears glasses Wears Glasses: Reading only Vision Assessment?: Vision impaired- to be further tested in functional context Additional Comments: visual field appears intact; difficulty with reading; poor visual attneiton; unorganzied scanning pattern     Perception Perception Perception Tested?: Yes Perception Deficits: Inattention/neglect;Spatial orientation Inattention/Neglect: Impaired- to be further tested in functional context Comments: Will attend to L side and use LUE spontaneously; R bias; unaware of L lateral lean; inconsistent resonse with double simultaneous stimulation - missed several targets in L field   [place] tested?: Deficits Deficits: Motor Impersistence(LUE)    Pertinent Vitals/Pain Pain Assessment: Faces Faces Pain Scale: No  hurt     Hand Dominance Right   Extremity/Trunk Assessment Upper Extremity Assessment Upper Extremity  Assessment: LUE deficits/detail LUE Deficits / Details: Moving LUE voluntarily with difficulty; AROM overall WFL; generalized weakness; poor in-hand manipulatiaons skills but attempting to use functionally; Brunstrom stage V (independencen from basic synergies) LUE Coordination: decreased fine motor;decreased gross motor   Lower Extremity Assessment Lower Extremity Assessment: Defer to PT evaluation   Cervical / Trunk Assessment Cervical / Trunk Assessment: Other exceptions Cervical / Trunk Exceptions: L bias   Communication Communication Communication: Other (comment)(dysarthric)   Cognition Arousal/Alertness: Lethargic Behavior During Therapy: Flat affect Overall Cognitive Status: Impaired/Different from baseline Area of Impairment: Safety/judgement;Awareness;Problem solving;Attention                   Current Attention Level: Selective     Safety/Judgement: Decreased awareness of safety;Decreased awareness of deficits Awareness: Emergent Problem Solving: Slow processing     General Comments       Exercises     Shoulder Instructions      Home Living Family/patient expects to be discharged to:: Private residence Living Arrangements: Spouse/significant other Available Help at Discharge: Family;Available 24 hours/day Type of Home: House Home Access: Stairs to enter [employer] of Steps: 1 Entrance Stairs-Rails: None Home Layout: One level     Bathroom Shower/Tub: Teacher, early years/pre: Standard Bathroom Accessibility: Yes How Accessible: Accessible via walker Home Equipment: None      Lives With: Spouse    Prior Functioning/Environment Level of Independence: Independent                 OT Problem List: Decreased strength;Decreased range of motion;Decreased activity tolerance;Impaired balance (sitting and/or standing);Impaired vision/perception;Decreased coordination;Decreased cognition;Decreased safety awareness;Decreased  knowledge of use of DME or AE;Impaired tone;Impaired UE functional use      OT Treatment/Interventions: Self-care/ADL training;Therapeutic exercise;Neuromuscular education;DME and/or AE instruction;Therapeutic activities;Cognitive remediation/compensation;Visual/perceptual remediation/compensation;Patient/family education;Balance training    OT Goals(Current goals can be found in the care plan section) Acute Rehab OT Goals Patient Stated Goal: to get better OT Goal Formulation: With patient Time For Goal Achievement: 06/24/18 Potential to Achieve Goals: Good  OT Frequency: Min 3X/week   Barriers to D/C:            Co-evaluation PT/OT/SLP Co-Evaluation/Treatment: Yes Reason for Co-Treatment: For patient/therapist safety;To address functional/ADL transfers   OT goals addressed during session: ADL's and self-care      AM-PAC OT "6 Clicks" Daily Activity     Outcome Measure Help from another person eating meals?: A Little Help from another person taking care of personal grooming?: A Little Help from another person toileting, which includes using toliet, bedpan, or urinal?: A Lot Help from another person bathing (including washing, rinsing, drying)?: A Lot Help from another person to put on and taking off regular upper body clothing?: A Lot Help from another person to put on and taking off regular lower body clothing?: A Lot 6 Click Score: 14   End of Session Equipment Utilized During Treatment: Gait belt Nurse Communication: Mobility status  Activity Tolerance: Patient tolerated treatment well Patient left: in chair;with call bell/phone within reach;with chair alarm set  OT Visit Diagnosis: Other abnormalities of gait and mobility (R26.89);Muscle weakness (generalized) (M62.81);Other symptoms and signs involving cognitive function;Hemiplegia and hemiparesis Hemiplegia - Right/Left: Left Hemiplegia - dominant/non-dominant: Non-Dominant Hemiplegia - caused by: Cerebral infarction                 Time: 5956-3875 OT Time Calculation (min):  35 min Charges:  OT General Charges $OT Visit: 1 Visit OT Evaluation $OT Eval Moderate Complexity: [place], OT/L   Acute OT Clinical Specialist Acute Rehabilitation Services Pager [phone] Office [phone]   [facility] 06/10/2018, 1:39 PM

## 2018-06-10 NOTE — Progress Notes (Signed)
Rehab Admissions Coordinator Note:  Patient was screened by Cleatrice Burke for appropriateness for an Inpatient Acute Rehab Consult per PT and OT recs..  At this time, we are recommending Inpatient Rehab consult once extubated.  Cleatrice Burke 06/10/2018, 3:01 PM  I can be reached at [phone].

## 2018-06-10 NOTE — Progress Notes (Signed)
Pt admitted to the unit as a transfer from [facility]N around 1720; pt has been drowsy but arouse from voice. Pt sleepy during shift and has had x2 diarrhea stools. Pt still due to void; bladder scanned for 126ml; MD notified and ordered to continue to monitor and bladder scan; if pt bladder scan greater than 300 and pt not voided; In and Out Cath. Pt  Will continue to closely monitor. Delia Heady RN

## 2018-06-10 NOTE — Progress Notes (Signed)
STROKE TEAM PROGRESS NOTE   SUBJECTIVE (INTERVAL HISTORY) His wife and daughter in law are at the bedside.  Overall his condition is rapidly improving. He is awake and eyes open, follows all simple commands, moving left UE and LE although still weaker than right. On weaning trial, plan to extubate today. Pending MRI and MRA.    OBJECTIVE Temp:  [97.9 F (36.6 C)-98.8 F (37.1 C)] 97.9 F (36.6 C) (12/05 0800) Pulse Rate:  [56-118] 80 (12/05 1000) Cardiac Rhythm: Normal sinus rhythm (12/05 0800) Resp:  [11-31] 31 (12/05 1000) BP: (92-137)/(42-91) 101/70 (12/05 1000) SpO2:  [73 %-100 %] 100 % (12/05 1000)  Recent Labs  Lab 06/08/18 1515 06/09/18 0812  GLUCAP 102* 103*   Recent Labs  Lab 06/08/18 1514 06/08/18 1526 06/08/18 2156 06/09/18 0454 06/10/18 0320  NA 137 138 134* 138 135  K 3.4* 3.4* 3.9 3.8 4.0  CL 103 107 101 105 101  CO2 20*  --  20* 22 20*  GLUCOSE 116* 114* 193* 76 96  BUN 63* 54* 58* 58* 71*  CREATININE 4.95* 5.10* 4.57* 4.84* 6.69*  CALCIUM 10.5*  --  9.8 9.4 9.2   Recent Labs  Lab 06/08/18 1514  AST 18  ALT 15  ALKPHOS 87  BILITOT 0.9  PROT 6.7  ALBUMIN 4.0   Recent Labs  Lab 06/08/18 1514 06/08/18 1526 06/09/18 0454 06/09/18 1459 06/10/18 0320  WBC 8.3  --  9.1 13.6* 12.2*  NEUTROABS 6.8  --  7.6  --   --   HGB 12.7* 14.3 11.3* 9.5* 8.1*  HCT 39.9 42.0 34.3* 30.0* 25.3*  MCV 87.9  --  84.3 86.2 85.8  PLT 153  --  149* 148* 131*   No results for input(s): CKTOTAL, CKMB, CKMBINDEX, TROPONINI in the last 168 hours. Recent Labs    06/08/18 1514  LABPROT 13.7  INR 1.06   Recent Labs    06/09/18 0418  COLORURINE YELLOW  LABSPEC 1.029  PHURINE 5.0  GLUCOSEU NEGATIVE  HGBUR SMALL*  BILIRUBINUR NEGATIVE  KETONESUR NEGATIVE  PROTEINUR 100*  NITRITE NEGATIVE  LEUKOCYTESUR NEGATIVE       Component Value Date/Time   CHOL 171 06/09/2018 0454   TRIG 101 06/09/2018 0454   TRIG 104 05/11/2006 1014   HDL 48 06/09/2018 0454    CHOLHDL 3.6 06/09/2018 0454   VLDL 20 06/09/2018 0454   LDLCALC 103 (H) 06/09/2018 0454   Lab Results  Component Value Date   HGBA1C 5.5 06/09/2018      Component Value Date/Time   LABOPIA NONE DETECTED 06/09/2018 0418   COCAINSCRNUR NONE DETECTED 06/09/2018 0418   LABBENZ NONE DETECTED 06/09/2018 0418   AMPHETMU NONE DETECTED 06/09/2018 0418   THCU NONE DETECTED 06/09/2018 0418   LABBARB NONE DETECTED 06/09/2018 0418    Recent Labs  Lab 06/08/18 1514  ETH <10    I have personally reviewed the radiological images below and agree with the radiology interpretations.  Ct Angio Head W Or Wo Contrast  Result Date: 06/08/2018 CLINICAL DATA:  72 year old male code stroke presentation with hyperdense right M1. History of rectal cancer with treated lung metastases. EXAM: CT ANGIOGRAPHY HEAD AND NECK CT PERFUSION BRAIN TECHNIQUE: Multidetector CT imaging of the head and neck was performed using the standard protocol during bolus administration of intravenous contrast. Multiplanar CT image reconstructions and MIPs were obtained to evaluate the vascular anatomy. Carotid stenosis measurements (when applicable) are obtained utilizing NASCET criteria, using the distal internal carotid diameter as the  denominator. Multiphase CT imaging of the brain was performed following IV bolus contrast injection. Subsequent parametric perfusion maps were calculated using RAPID software. CONTRAST:  161mL ISOVUE-370 IOPAMIDOL (ISOVUE-370) INJECTION 76% COMPARISON:  Noncontrast head CT 1521 hours today. Chest CT 12/01/2017. FINDINGS: CT Brain Perfusion Findings: CBF (<30%) Volume: 13 milliliters Perfusion (Tmax>6.0s) volume: 132 milliliters (hypoperfusion index of 0.5) Mismatch Volume: 119 milliliters (mismatch ratio of 10.2 Infarction Location:Deep right MCA territory white matter CTA NECK Skeleton: Absent dentition. No acute osseous abnormality identified. Upper chest: Stable partially calcified posterior left upper  lobe lung nodule measuring 2-3 centimeters. Peripheral right upper lobe nodularity and pleural thickening also appears stable. No superior mediastinal lymphadenopathy. Other neck: Left nasal airway in place.  Otherwise negative. Aortic arch: 3 vessel arch configuration with mild for age arch atherosclerosis. Right carotid system: Negative brachiocephalic and right CCA origin. The right CCA is patent, but at the right carotid bifurcation the right ICA is abruptly occluded in the mid bulb as seen on series 10, image 69. Mild to moderate superimposed right ICA origin plaque. No reconstituted enhancement in the neck. Left carotid system: Negative left CCA origin. Mildly tortuous left CCA. Soft and calcified plaque at the left carotid bifurcation, left ICA origin and bulb resulting in less than 50 % stenosis with respect to the distal vessel. Vertebral arteries: No proximal right subclavian artery stenosis despite mild plaque. There is low-density plaque or thrombus in the right vertebral artery origin and V1 segment resulting in near occlusion (series 9, image 142),. The distal right V1 segment then resumes enhancing but there is poor enhancement throughout the right vertebral artery to the skull base. Mild plaque at the left subclavian artery origin without stenosis. More bulky soft plaque at the proximal left subclavian near the left vertebral artery origin as demonstrated on series 8, image 272. The left vertebral origin is patent, but there is evidence of some left V1 segment soft plaque and stenosis as seen on series 9, image 160. Elsewhere in the neck the and appears normal left vertebral. Artery is patent patent distal CTA HEAD Posterior circulation: Left vertebral artery with calcified plaque resulting in mild to moderate stenosis. The right V4 segment is occluded. The basilar artery is patent although irregular throughout much of its course. There is no high-grade basilar stenosis. The SCA and PCA origins are  patent. There is mild irregularity and stenosis at the left PCA origin (series 12, image 24). Posterior communicating arteries are diminutive or absent. There is contralateral moderate to severe right PCA P1/P2 junction stenosis (series 11, image 21). However, bilateral distal PCA enhancement is symmetric and within normal limits. Anterior circulation: The right ICA siphon is occluded with superimposed calcified plaque. The right ICA terminus is occluded. There is no right MCA M1 or bifurcation enhancement. There is minimal distal right MCA branch enhancement. The left ICA siphon is patent with calcified plaque resulting in moderate to severe supraclinoid segment stenosis as seen on series 10, image 107. The left ICA terminus is patent. The left MCA and ACA origins are normal. The anterior communicating artery is diminutive but patent and appears to supply the right ACA A2 and distal A1 segments. There is mild bilateral ACA irregularity. The left MCA M1 segment and left MCA bifurcation are patent without stenosis. Left MCA branches are mildly irregular. Venous sinuses: Not evaluated due to early contrast timing. Anatomic variants: None. Review of the MIP images confirms the above findings IMPRESSION: 1. Positive for LVO (occluded Right ICA, Right  ICA terminus and Right MCA) with CT Perfusion criteria favorable for endovascular reperfusion (core infarct estimated at 13 mL with mismatch volume 119 mL, mismatch ratio of 10.2). Preliminary report of this was discussed by telephone with Dr. Amie Portland on 06/08/2018 at 15:46. 2. Positive also for occluded Right Vertebral Artery and soft plaque or thrombus at the Left Vertebral Artery origin. In conjunction with #1 this might reflect sequelae of cardiac or paradoxical emboli. This was discussed with Dr. Juliet Rude in [place] at 1359 hours. 3. Superimposed intracranial atherosclerosis including moderate or severe stenosis of the: - Left ICA supraclinoid segment. - Left  vertebral artery V4 segment. - Right PCA P1/P2. 4. Stable visible upper chest since the CT on 12/01/2017. Electronically Signed   By: Genevie Ann M.D.   On: 06/08/2018 16:05   Ct Angio Neck W Or Wo Contrast  Result Date: 06/08/2018 CLINICAL DATA:  72 year old male code stroke presentation with hyperdense right M1. History of rectal cancer with treated lung metastases. EXAM: CT ANGIOGRAPHY HEAD AND NECK CT PERFUSION BRAIN TECHNIQUE: Multidetector CT imaging of the head and neck was performed using the standard protocol during bolus administration of intravenous contrast. Multiplanar CT image reconstructions and MIPs were obtained to evaluate the vascular anatomy. Carotid stenosis measurements (when applicable) are obtained utilizing NASCET criteria, using the distal internal carotid diameter as the denominator. Multiphase CT imaging of the brain was performed following IV bolus contrast injection. Subsequent parametric perfusion maps were calculated using RAPID software. CONTRAST:  180mL ISOVUE-370 IOPAMIDOL (ISOVUE-370) INJECTION 76% COMPARISON:  Noncontrast head CT 1521 hours today. Chest CT 12/01/2017. FINDINGS: CT Brain Perfusion Findings: CBF (<30%) Volume: 13 milliliters Perfusion (Tmax>6.0s) volume: 132 milliliters (hypoperfusion index of 0.5) Mismatch Volume: 119 milliliters (mismatch ratio of 10.2 Infarction Location:Deep right MCA territory white matter CTA NECK Skeleton: Absent dentition. No acute osseous abnormality identified. Upper chest: Stable partially calcified posterior left upper lobe lung nodule measuring 2-3 centimeters. Peripheral right upper lobe nodularity and pleural thickening also appears stable. No superior mediastinal lymphadenopathy. Other neck: Left nasal airway in place.  Otherwise negative. Aortic arch: 3 vessel arch configuration with mild for age arch atherosclerosis. Right carotid system: Negative brachiocephalic and right CCA origin. The right CCA is patent, but at the right  carotid bifurcation the right ICA is abruptly occluded in the mid bulb as seen on series 10, image 69. Mild to moderate superimposed right ICA origin plaque. No reconstituted enhancement in the neck. Left carotid system: Negative left CCA origin. Mildly tortuous left CCA. Soft and calcified plaque at the left carotid bifurcation, left ICA origin and bulb resulting in less than 50 % stenosis with respect to the distal vessel. Vertebral arteries: No proximal right subclavian artery stenosis despite mild plaque. There is low-density plaque or thrombus in the right vertebral artery origin and V1 segment resulting in near occlusion (series 9, image 142),. The distal right V1 segment then resumes enhancing but there is poor enhancement throughout the right vertebral artery to the skull base. Mild plaque at the left subclavian artery origin without stenosis. More bulky soft plaque at the proximal left subclavian near the left vertebral artery origin as demonstrated on series 8, image 272. The left vertebral origin is patent, but there is evidence of some left V1 segment soft plaque and stenosis as seen on series 9, image 160. Elsewhere in the neck the and appears normal left vertebral. Artery is patent patent distal CTA HEAD Posterior circulation: Left vertebral artery with calcified plaque resulting  in mild to moderate stenosis. The right V4 segment is occluded. The basilar artery is patent although irregular throughout much of its course. There is no high-grade basilar stenosis. The SCA and PCA origins are patent. There is mild irregularity and stenosis at the left PCA origin (series 12, image 24). Posterior communicating arteries are diminutive or absent. There is contralateral moderate to severe right PCA P1/P2 junction stenosis (series 11, image 21). However, bilateral distal PCA enhancement is symmetric and within normal limits. Anterior circulation: The right ICA siphon is occluded with superimposed calcified  plaque. The right ICA terminus is occluded. There is no right MCA M1 or bifurcation enhancement. There is minimal distal right MCA branch enhancement. The left ICA siphon is patent with calcified plaque resulting in moderate to severe supraclinoid segment stenosis as seen on series 10, image 107. The left ICA terminus is patent. The left MCA and ACA origins are normal. The anterior communicating artery is diminutive but patent and appears to supply the right ACA A2 and distal A1 segments. There is mild bilateral ACA irregularity. The left MCA M1 segment and left MCA bifurcation are patent without stenosis. Left MCA branches are mildly irregular. Venous sinuses: Not evaluated due to early contrast timing. Anatomic variants: None. Review of the MIP images confirms the above findings IMPRESSION: 1. Positive for LVO (occluded Right ICA, Right ICA terminus and Right MCA) with CT Perfusion criteria favorable for endovascular reperfusion (core infarct estimated at 13 mL with mismatch volume 119 mL, mismatch ratio of 10.2). Preliminary report of this was discussed by telephone with Dr. Amie Portland on 06/08/2018 at 15:46. 2. Positive also for occluded Right Vertebral Artery and soft plaque or thrombus at the Left Vertebral Artery origin. In conjunction with #1 this might reflect sequelae of cardiac or paradoxical emboli. This was discussed with Dr. Juliet Rude in [place] at 1359 hours. 3. Superimposed intracranial atherosclerosis including moderate or severe stenosis of the: - Left ICA supraclinoid segment. - Left vertebral artery V4 segment. - Right PCA P1/P2. 4. Stable visible upper chest since the CT on 12/01/2017. Electronically Signed   By: Genevie Ann M.D.   On: 06/08/2018 16:05   Ct Cerebral Perfusion W Contrast  Result Date: 06/08/2018 CLINICAL DATA:  72 year old male code stroke presentation with hyperdense right M1. History of rectal cancer with treated lung metastases. EXAM: CT ANGIOGRAPHY HEAD AND NECK CT  PERFUSION BRAIN TECHNIQUE: Multidetector CT imaging of the head and neck was performed using the standard protocol during bolus administration of intravenous contrast. Multiplanar CT image reconstructions and MIPs were obtained to evaluate the vascular anatomy. Carotid stenosis measurements (when applicable) are obtained utilizing NASCET criteria, using the distal internal carotid diameter as the denominator. Multiphase CT imaging of the brain was performed following IV bolus contrast injection. Subsequent parametric perfusion maps were calculated using RAPID software. CONTRAST:  166mL ISOVUE-370 IOPAMIDOL (ISOVUE-370) INJECTION 76% COMPARISON:  Noncontrast head CT 1521 hours today. Chest CT 12/01/2017. FINDINGS: CT Brain Perfusion Findings: CBF (<30%) Volume: 13 milliliters Perfusion (Tmax>6.0s) volume: 132 milliliters (hypoperfusion index of 0.5) Mismatch Volume: 119 milliliters (mismatch ratio of 10.2 Infarction Location:Deep right MCA territory white matter CTA NECK Skeleton: Absent dentition. No acute osseous abnormality identified. Upper chest: Stable partially calcified posterior left upper lobe lung nodule measuring 2-3 centimeters. Peripheral right upper lobe nodularity and pleural thickening also appears stable. No superior mediastinal lymphadenopathy. Other neck: Left nasal airway in place.  Otherwise negative. Aortic arch: 3 vessel arch configuration with mild for age arch  atherosclerosis. Right carotid system: Negative brachiocephalic and right CCA origin. The right CCA is patent, but at the right carotid bifurcation the right ICA is abruptly occluded in the mid bulb as seen on series 10, image 69. Mild to moderate superimposed right ICA origin plaque. No reconstituted enhancement in the neck. Left carotid system: Negative left CCA origin. Mildly tortuous left CCA. Soft and calcified plaque at the left carotid bifurcation, left ICA origin and bulb resulting in less than 50 % stenosis with respect to the  distal vessel. Vertebral arteries: No proximal right subclavian artery stenosis despite mild plaque. There is low-density plaque or thrombus in the right vertebral artery origin and V1 segment resulting in near occlusion (series 9, image 142),. The distal right V1 segment then resumes enhancing but there is poor enhancement throughout the right vertebral artery to the skull base. Mild plaque at the left subclavian artery origin without stenosis. More bulky soft plaque at the proximal left subclavian near the left vertebral artery origin as demonstrated on series 8, image 272. The left vertebral origin is patent, but there is evidence of some left V1 segment soft plaque and stenosis as seen on series 9, image 160. Elsewhere in the neck the and appears normal left vertebral. Artery is patent patent distal CTA HEAD Posterior circulation: Left vertebral artery with calcified plaque resulting in mild to moderate stenosis. The right V4 segment is occluded. The basilar artery is patent although irregular throughout much of its course. There is no high-grade basilar stenosis. The SCA and PCA origins are patent. There is mild irregularity and stenosis at the left PCA origin (series 12, image 24). Posterior communicating arteries are diminutive or absent. There is contralateral moderate to severe right PCA P1/P2 junction stenosis (series 11, image 21). However, bilateral distal PCA enhancement is symmetric and within normal limits. Anterior circulation: The right ICA siphon is occluded with superimposed calcified plaque. The right ICA terminus is occluded. There is no right MCA M1 or bifurcation enhancement. There is minimal distal right MCA branch enhancement. The left ICA siphon is patent with calcified plaque resulting in moderate to severe supraclinoid segment stenosis as seen on series 10, image 107. The left ICA terminus is patent. The left MCA and ACA origins are normal. The anterior communicating artery is diminutive  but patent and appears to supply the right ACA A2 and distal A1 segments. There is mild bilateral ACA irregularity. The left MCA M1 segment and left MCA bifurcation are patent without stenosis. Left MCA branches are mildly irregular. Venous sinuses: Not evaluated due to early contrast timing. Anatomic variants: None. Review of the MIP images confirms the above findings IMPRESSION: 1. Positive for LVO (occluded Right ICA, Right ICA terminus and Right MCA) with CT Perfusion criteria favorable for endovascular reperfusion (core infarct estimated at 13 mL with mismatch volume 119 mL, mismatch ratio of 10.2). Preliminary report of this was discussed by telephone with Dr. Amie Portland on 06/08/2018 at 15:46. 2. Positive also for occluded Right Vertebral Artery and soft plaque or thrombus at the Left Vertebral Artery origin. In conjunction with #1 this might reflect sequelae of cardiac or paradoxical emboli. This was discussed with Dr. Juliet Rude in [place] at 1359 hours. 3. Superimposed intracranial atherosclerosis including moderate or severe stenosis of the: - Left ICA supraclinoid segment. - Left vertebral artery V4 segment. - Right PCA P1/P2. 4. Stable visible upper chest since the CT on 12/01/2017. Electronically Signed   By: Genevie Ann M.D.   On:  06/08/2018 16:05   Ct Head Code Stroke Wo Contrast  Result Date: 06/08/2018 CLINICAL DATA:  Code stroke.  71 year old male. Rightward gaze. EXAM: CT HEAD WITHOUT CONTRAST TECHNIQUE: Contiguous axial images were obtained from the base of the skull through the vertex without intravenous contrast. COMPARISON:  None. FINDINGS: Brain: Asymmetric loss of gray-white matter differentiation at the right insula on series 3, image 17. No other cytotoxic edema identified in the right MCA territory. No midline shift, ventriculomegaly, mass effect, evidence of mass lesion, intracranial hemorrhage. Patchy bilateral white matter hypodensity. Vascular: Calcified atherosclerosis at the skull  base. Hyperdense right MCA M1 segment as seen on series 5, image 26. Skull: No acute osseous abnormality identified. Sinuses/Orbits: left nasal airway. Paranasal sinuses are well pneumatized, mild right maxillary mucosal thickening. Mild right mastoid effusion. Tympanic cavities remain clear. Other: Mild rightward gaze deviation. No other acute orbit or scalp soft tissue findings. ASPECTS [facility] Stroke Program Early CT Score) - Ganglionic level infarction (caudate, lentiform nuclei, internal capsule, insula, M1-M3 cortex): 6 - Supraganglionic infarction (M4-M6 cortex): Total score (0-10 with 10 being normal): IMPRESSION: 1. Hyperdense Right MCA M1 segment compatible with ELVO. Subtle cytotoxic edema suspected in the right insula, ASPECTS is 9. 2. Negative for bleed. 3. These results were communicated to Dr. Rory Percy at 3:33 pmon 12/3/2019by text page via the [facility] messaging system. Electronically Signed   By: Genevie Ann M.D.   On: 06/08/2018 15:34   Mr Jason [name] Head Wo Contrast  Result Date: 06/09/2018 CLINICAL DATA:  Left hemiparesis and dysarthria. Status post endovascular revascularization of occluded right ICA, right MCA, and proximal right ACA. EXAM: MRI HEAD WITHOUT CONTRAST MRA HEAD WITHOUT CONTRAST TECHNIQUE: Multiplanar, multiecho pulse sequences of the brain and surrounding structures were obtained without intravenous contrast. Angiographic images of the head were obtained using MRA technique without contrast. COMPARISON:  Head CT, CTA, and cerebral perfusion 06/08/2018 FINDINGS: MRI HEAD FINDINGS Brain: There are patchy acute infarcts involving gray and white matter scattered throughout the right MCA territory including extensive involvement of the right basal ganglia. The right insula is also diffusely involved, and there are additional small acute infarcts in the right temporal, right frontal, and parietal lobes. No associated hemorrhage is seen. There is no intracranial mass effect or extra-axial fluid  collection. There is mild cerebral atrophy, and scattered T2 hyperintensities in the cerebral white matter bilaterally are nonspecific but compatible with mild chronic small vessel ischemic disease. A chronic lacunar infarct is noted in the left lentiform nucleus. Vascular: Abnormal appearance of the distal right vertebral artery. Susceptibility from right MCA stent. Skull and upper cervical spine: Mildly heterogeneous bone marrow signal diffusely without destructive lesion. Sinuses/Orbits: Rightward gaze. Mild scattered mucosal thickening in the paranasal sinuses. Right larger than left mastoid effusions. Other: None. MRA HEAD FINDINGS The visualized distal left vertebral artery is patent and supplies the basilar with mild stenosis distally. The right V4 segment is occluded as seen on yesterday's CTA. The basilar artery is patent with mild diffuse irregularity and up to mild stenosis in its proximal and mid portions. SCAs are patent. There is a patent left posterior communicating artery. PCAs are patent with mild left P1 and severe proximal right P2 stenoses again noted. Lack of signal in the included mid cervical ICA on the right likely reflects artifact from the recently placed stent given normal flow related enhancement more distally. The intracranial internal carotid arteries are patent with mild right and moderate to severe left supraclinoid stenoses. A 2 mm aneurysm  projects inferolaterally from the horizontal cavernous ICA on the right. The ACAs are patent with moderate bilateral origin stenoses. The left MCA is patent with branch vessel irregularity but no significant proximal stenosis. The right MCA remains widely patent proximal to the stent. There is variable signal loss involving the more distal right M1 segment related to the stent which limits assessment for stenosis. Right M2 trunks appear patent proximally, however there is attenuation of more distal right MCA branch vessels with multiple missing  branches involving the superior greater than inferior divisions. IMPRESSION: 1. Patchy acute right MCA infarcts with greatest involvement of the basal ganglia. 2. Mild chronic small vessel ischemic disease. 3. Continued patency of the intracranial right ICA, right ACA, and proximal right MCA following revascularization. Artifact from right MCA stent with decreased number of distal MCA branch vessels. 4. Mild right and moderate to severe left supraclinoid ICA stenoses. 5. Moderate bilateral ACA origin stenoses. 6. Occluded distal right vertebral artery. 7. Severe proximal right P2 stenosis. 8. 2 mm right cavernous ICA aneurysm. Electronically Signed   By: Logan Bores M.D.   On: 06/09/2018 14:09   Mr Brain Wo Contrast  Result Date: 06/09/2018 CLINICAL DATA:  Left hemiparesis and dysarthria. Status post endovascular revascularization of occluded right ICA, right MCA, and proximal right ACA. EXAM: MRI HEAD WITHOUT CONTRAST MRA HEAD WITHOUT CONTRAST TECHNIQUE: Multiplanar, multiecho pulse sequences of the brain and surrounding structures were obtained without intravenous contrast. Angiographic images of the head were obtained using MRA technique without contrast. COMPARISON:  Head CT, CTA, and cerebral perfusion 06/08/2018 FINDINGS: MRI HEAD FINDINGS Brain: There are patchy acute infarcts involving gray and white matter scattered throughout the right MCA territory including extensive involvement of the right basal ganglia. The right insula is also diffusely involved, and there are additional small acute infarcts in the right temporal, right frontal, and parietal lobes. No associated hemorrhage is seen. There is no intracranial mass effect or extra-axial fluid collection. There is mild cerebral atrophy, and scattered T2 hyperintensities in the cerebral white matter bilaterally are nonspecific but compatible with mild chronic small vessel ischemic disease. A chronic lacunar infarct is noted in the left lentiform  nucleus. Vascular: Abnormal appearance of the distal right vertebral artery. Susceptibility from right MCA stent. Skull and upper cervical spine: Mildly heterogeneous bone marrow signal diffusely without destructive lesion. Sinuses/Orbits: Rightward gaze. Mild scattered mucosal thickening in the paranasal sinuses. Right larger than left mastoid effusions. Other: None. MRA HEAD FINDINGS The visualized distal left vertebral artery is patent and supplies the basilar with mild stenosis distally. The right V4 segment is occluded as seen on yesterday's CTA. The basilar artery is patent with mild diffuse irregularity and up to mild stenosis in its proximal and mid portions. SCAs are patent. There is a patent left posterior communicating artery. PCAs are patent with mild left P1 and severe proximal right P2 stenoses again noted. Lack of signal in the included mid cervical ICA on the right likely reflects artifact from the recently placed stent given normal flow related enhancement more distally. The intracranial internal carotid arteries are patent with mild right and moderate to severe left supraclinoid stenoses. A 2 mm aneurysm projects inferolaterally from the horizontal cavernous ICA on the right. The ACAs are patent with moderate bilateral origin stenoses. The left MCA is patent with branch vessel irregularity but no significant proximal stenosis. The right MCA remains widely patent proximal to the stent. There is variable signal loss involving the more distal right M1  segment related to the stent which limits assessment for stenosis. Right M2 trunks appear patent proximally, however there is attenuation of more distal right MCA branch vessels with multiple missing branches involving the superior greater than inferior divisions. IMPRESSION: 1. Patchy acute right MCA infarcts with greatest involvement of the basal ganglia. 2. Mild chronic small vessel ischemic disease. 3. Continued patency of the intracranial right ICA,  right ACA, and proximal right MCA following revascularization. Artifact from right MCA stent with decreased number of distal MCA branch vessels. 4. Mild right and moderate to severe left supraclinoid ICA stenoses. 5. Moderate bilateral ACA origin stenoses. 6. Occluded distal right vertebral artery. 7. Severe proximal right P2 stenosis. 8. 2 mm right cavernous ICA aneurysm. Electronically Signed   By: Logan Bores M.D.   On: 06/09/2018 14:09   Vas [place] Lower Extremity Venous (dvt)  Result Date: 06/09/2018  Lower Venous Study Indications: Stroke.  Limitations: Bedrest. Performing Technologist: Maudry Mayhew MHA, RDMS, RVT, RDCS  Examination Guidelines: A complete evaluation includes B-mode imaging, spectral Doppler, color Doppler, and power Doppler as needed of all accessible portions of each vessel. Bilateral testing is considered an integral part of a complete examination. Limited examinations for reoccurring indications may be performed as noted.  Right Venous Findings: +---------+---------------+---------+-----------+----------+-------------------+          CompressibilityPhasicitySpontaneityPropertiesSummary             +---------+---------------+---------+-----------+----------+-------------------+ CFV                                                   Unable to visualize +---------+---------------+---------+-----------+----------+-------------------+ SFJ                                                   Unable to visualize +---------+---------------+---------+-----------+----------+-------------------+ FV Prox  Full                                                             +---------+---------------+---------+-----------+----------+-------------------+ FV Mid   Full                                                             +---------+---------------+---------+-----------+----------+-------------------+ FV DistalFull                                                              +---------+---------------+---------+-----------+----------+-------------------+ PFV                                                   Unable to  adequately                                                                visualize           +---------+---------------+---------+-----------+----------+-------------------+ POP                     Yes      Yes                                      +---------+---------------+---------+-----------+----------+-------------------+ PTV      Full                                                             +---------+---------------+---------+-----------+----------+-------------------+ PERO     Full                                                             +---------+---------------+---------+-----------+----------+-------------------+  Left Venous Findings: +---------+---------------+---------+-----------+----------+-------------------+          CompressibilityPhasicitySpontaneityPropertiesSummary             +---------+---------------+---------+-----------+----------+-------------------+ CFV                                                   Unable to visualize +---------+---------------+---------+-----------+----------+-------------------+ SFJ                                                   Unable to visualize +---------+---------------+---------+-----------+----------+-------------------+ FV Prox  Full                                                             +---------+---------------+---------+-----------+----------+-------------------+ FV Mid   Full                                                             +---------+---------------+---------+-----------+----------+-------------------+ FV DistalFull                                                              +---------+---------------+---------+-----------+----------+-------------------+  PFV      Full                                                             +---------+---------------+---------+-----------+----------+-------------------+ POP      Full           Yes      Yes                                      +---------+---------------+---------+-----------+----------+-------------------+ PTV      Full                                                             +---------+---------------+---------+-----------+----------+-------------------+ PERO     Full                                                             +---------+---------------+---------+-----------+----------+-------------------+    Summary: Right: There is no evidence of deep vein thrombosis in the lower extremity. However, portions of this examination were limited- see technologist comments above. No cystic structure found in the popliteal fossa. Left: There is no evidence of deep vein thrombosis in the lower extremity. However, portions of this examination were limited- see technologist comments above. No cystic structure found in the popliteal fossa.  *See table(s) above for measurements and observations.    Preliminary     PHYSICAL EXAM  Temp:  [97.9 F (36.6 C)-98.8 F (37.1 C)] 97.9 F (36.6 C) (12/05 0800) Pulse Rate:  [56-118] 80 (12/05 1000) Resp:  [11-31] 31 (12/05 1000) BP: (92-137)/(42-91) 101/70 (12/05 1000) SpO2:  [73 %-100 %] 100 % (12/05 1000)  General - Well nourished, well developed, sleepy but easily arousable  Ophthalmologic - fundi not visualized due to noncooperation.  Cardiovascular - Regular rate and rhythm.  Neuro - extubated, sleepy but arousable easily, eyes open on voice, following all simple commands. PERRL, EOMI, no gaze preference, visual field full. Left facial droop, moderate dysarthria, tongue midline. RUE and LLE 4+/5, LUE proximal 3/5 and distal 4/5 and LLE 4/5 proximal  and distally. Sensation symmetrical. Coordination ataxia on the right FTN proportional to the weakness, and gait not tested. Right groin area soft no hematoma.    ASSESSMENT/PLAN [name] Sr. is a 72 y.o. male with history of CKD, COPD, anemia, HTN, obesity, OSA on CPAP, rectal cancer admitted for slurry speech, left sided weakness and found down at home. No tPA given due to outside window.    Stroke:  right MCA infarct due to right ICA and MCA occlusion s/p IR and rescue stents with TICI2b reperfusion, most likely due to large vessel athero, but cardioembolic still in DDx  Resultant intubated with left hemiparesis  CT right MCA hyperdense sign  CTA head and neck - right ICA and MCA occlusion, right VA high grade stenosis  vs. Occlusion, left siphon, right P1/P2, and left VA high grade stenosis  MRI  Right MCA patchy infarcts  MRA patent right ICA, right ACA and proximal right MCA.  Decreased right MCA branches, left ICA siphon and right P2 severe stenosis.  Occluded right VA.  DSA - right ICA proximal occlusion s/p stenting with recannulization and right MCA occlusion s/p TICI 2b reperfusion with stenting. B/l VA severe stenosis origins.   2D Echo  Pending   LE venous doppler no DVT  recommend 30 day cardiac event monitoring as outpt to rule out afib  LDL 103  HgbA1c 5.5  Heparin subq for VTE prophylaxis  No antithrombotic prior to admission, now on aspirin 81 mg daily and brilinta due to intracranial stents.  Ongoing aggressive stroke risk factor management  Therapy recommendations:  Pending   Disposition:  Pending   Respiratory failure   Intubated for airway protection with procedure  Pt awake and following commands now  On weaning trial now  Extubation 06/10/18  CKD stage V  Cre 5.10->4.57->4.84->6.69  Has AVF placed recently  No HD yet PTA  Continue monitoring - no indication for HD at this time  Encourage p.o. intake  No hyperkalemia  CXR  pending to rule out pleural effusion  BMP in am  Anemia   due to CKD and blood loss of groin hematoma  Groin hematoma stable now  Hb 12.7->11.3->9.5->8.1  Close CBC monitoring  Consider transfusion if Hb < 7.0  Hypertension . BP on the low side . BP goal < 180 . D/c metoprolol for now  Long term BP goal normotensive  Hyperlipidemia  Home meds:  none   LDL 103, goal < 70  On lipitor 20  Continue lipitor on discharge  Other Stroke Risk Factors  Advanced age  Former Cigarette smoker, now quit  ETOH use in the past  Obstructive sleep apnea, on CPAP at home  Other Active Problems  COPD  Rectal cancer history  Leukocytosis WBC 13.6-> 12.2  Hospital day # 2  This patient is critically ill due to right MCA infarct, right ICA and MCA occlusion s/p procedure, respiratory failure, CKD stage V, HTN and at significant risk of neurological worsening, death form recurrent stroke, stent reocclusion, renal failure, heart failure, seizure. This patient's care requires constant monitoring of vital signs, hemodynamics, respiratory and cardiac monitoring, review of multiple databases, neurological assessment, discussion with family, other specialists and medical decision making of high complexity. I spent 40 minutes of neurocritical care time in the care of this patient. I discussed with Dr. Loanne Drilling on the phone.   Rosalin Hawking, MD PhD Stroke Neurology 06/10/2018 11:55 AM    To contact Stroke Continuity provider, please refer to [email or web address] After hours, contact General Neurology

## 2018-06-10 NOTE — Progress Notes (Signed)
  Speech Language Pathology Treatment: Dysphagia  Patient Details Name: Jason AGCAOILI Sr. MRN: [id number] DOB: April 24, 1946 Today's Date: 06/10/2018 Time: 7001-7494 SLP Time Calculation (min) (ACUTE ONLY): 50 min  Assessment / Plan / Recommendation Clinical Impression  Pt seen with am meal. Dentures still not present. Mild oral dysphagia observed given left CN VII and XII weakness. MIld anterior spillage and collection of oral residue in left buccal cavity observed. Pt able to clear with a liquid wash or extra swallow independently. The only concern is when pt drinks from a cup and tilts head back he had consistent cough response. Suspect that position was leading to spill of liquids with sensed penetration/aspiration. Could be from oral residue spilling back due to position. When taking straw sips, continuous or otherwise, with head neutral there were no further signs of aspiration. Discussed with pt who demonstrated through the rest of session and verbalized recall at the end of session. Asked RN to help pt set up with meals with lidded cups and straws. Pt was able to manage fairly dry grits well, despite oral deficits. Though dentures are not present, will advance diet to dys 2 to offer pt better PO choices as he appears to have a good appetite.    HPI HPI: 72 y.o. male with history of CKD, COPD, anemia, HTN, obesity, OSA on CPAP, rectal cancer found down at home and admitted with dysarthric speech, left sided weakness.  Dx right ICA/MCA occlusion, s/p IR and stents. Intubated for procedure, extubated next day 12/4.        SLP Plan  Continue with current plan of care       Recommendations  Diet recommendations: Thin liquid;Dysphagia 2 (fine chop) Liquids provided via: Cup;Straw Medication Administration: Whole meds with liquid Supervision: Patient able to self feed Compensations: Minimize environmental distractions;Slow rate;Small sips/bites;Other (Comment)(keep head neutral) Postural  Changes and/or Swallow Maneuvers: Seated upright 90 degrees                Oral Care Recommendations: Oral care BID Follow up Recommendations: None SLP Visit Diagnosis: Dysphagia, unspecified (R13.10) Plan: Continue with current plan of care       GO               Herbie [place], MA [place] Pager [phone] Office [phone]  Lynann Beaver 06/10/2018, 11:16 AM

## 2018-06-10 NOTE — Care Management Note (Signed)
Case Management Note  Patient Details  Name: Jason CARAWAY Sr. MRN: [id number] Date of Birth: 02-20-1946  Subjective/Objective:  72 year old male with rectal cancer status post chemotherapy and radiation, CKD stage V, OSA who presented with slurred speech and left-sided weakness and found with right MCA infarct secondary to right ICA and MCA occlusion status post stent and reperfusion.  PTA, pt independent, lives at home with spouse.                   Action/Plan: PT/OT Recommending CIR and rehab consult requested.  Family able to provide assistance at discharge.  Will follow progress.  Expected Discharge Date:                  Expected Discharge Plan:  IP Rehab Facility  In-House Referral:  Clinical Social Work  Discharge planning Services  CM Consult  Post Acute Care Choice:    Choice offered to:     DME Arranged:    DME Agency:     HH Arranged:    [place] Agency:     Status of Service:  In process, will continue to follow  If discussed at Long Length of Stay Meetings, dates discussed:    Additional Comments:  Reinaldo Raddle, RN, BSN  Trauma/Neuro ICU Case Manager [phone]

## 2018-06-10 NOTE — Consult Note (Signed)
Physical Medicine and Rehabilitation Consult   Reason for Consult: Stroke with left sided weakness and dysarthria Referring Physician: Dr. Erlinda Hong   HPI: Jason Cotta Sr. is a 72 y.o. male with history of HTN, rectal cancer, neuropathy, severe pulmonary hypertension, CKD--s/p AVF, OSA; who was admitted on 06/08/2018 after found down in the bathroom with inability to move left side and severely dysarthric speech.  CTA/perfusion study revealed occluded right ICA, right ICA terminus and right MCA, occluded right vertebral artery and soft plaque with thrombus left vertebral artery origin with CT perfusion tight criteria favorable for endovascular reperfusion.  He underwent cerebral angiogram with rescue stent and T1C1 2B reperfusion.  Hypertension treated with Cleviprex stroke felt to be likely due to large vessel atherosclerosis but cardioembolic source needs to be ruled out.  30-day cardiac event monitor recommended on outpatient basis.  BLE Dopplers negative for DVT.  MRI/MRA of brain done revealing acute right MCA infarcts greatest involvement in basal ganglia, moderate bilateral ICA origin stenosis, occluded distal right VA and severe proximal right P2 stenosis, 2 mm right cavernous ICA aneurysm. therapy evaluations initiated today and patient noted to be limited by severe dysarthria with dysphagia,  left-sided weakness, ataxia and poor awareness of deficits.  CIR recommended due to functional deficits done   Review of Systems  Constitutional: Negative for chills and fever.  HENT: Negative for hearing loss and tinnitus.   Eyes: Negative for blurred vision and double vision.  Respiratory: Negative for cough and shortness of breath.   Cardiovascular: Negative for chest pain, palpitations and leg swelling.  Gastrointestinal: Negative for constipation, heartburn and nausea.  Genitourinary: Negative for frequency and urgency.  Musculoskeletal: Positive for back pain.  Skin: Negative for rash.    Neurological: Positive for sensory change, speech change and focal weakness. Negative for dizziness and headaches.  Psychiatric/Behavioral: The patient is not nervous/anxious.      Past Medical History:  Diagnosis Date  . Anemia   . Arthritis   . Chronic renal insufficiency 07/31/2011  . COPD (chronic obstructive pulmonary disease) ([place])    pt denies  . Deficiency anemia 07/20/2012  . Diverticulosis   . Edema 09/21/2010   Qualifier: Diagnosis of  By: Jerold Coombe    . ERECTILE DYSFUNCTION, MILD 05/11/2007   Qualifier: Diagnosis of  By: Sherren Mocha MD, Jory Ee   . Exertional dyspnea 10/30/2015  . GOUT 05/11/2007   Qualifier: Diagnosis of  By: Sherren Mocha MD, Jory Ee   . Hereditary and idiopathic peripheral neuropathy 09/14/2014  . History of radiation therapy 02/21/13-03/31/13   rectum 50.4Gy total dose  . Hypersomnia 10/30/2015  . Hypertension   . Lung nodule 10/30/2015  . OAB (overactive bladder) 01/09/2016  . Obesity (BMI 30.0-34.9) 10/30/2015  . Obstructive sleep apnea 07/29/2010   HST 11/2015 AHI 54.  On autocpap  >> 10 cm   . Peripheral edema 09/19/2013  . rectal ca dx'd 02/01/13   rectal. Radiation and chemotherapy- remains on chemotherapy-next tx. 08-22-13.   . S/P partial lobectomy of lung 11/20/2015  . S/P thoracotomy   . SUI (stress urinary incontinence), male 01/09/2016    Past Surgical History:  Procedure Laterality Date  . AV FISTULA PLACEMENT Left 03/24/2016   Procedure: CREATION OF LEFT BRACIOCEPHALIC ARTERIOVENOUS (AV) FISTULA;  Surgeon: Elam Dutch, MD;  Location: [facility];  Service: Vascular;  Laterality: Left;  . COLON SURGERY  05/20/2013  . COLONOSCOPY  2010   [facility]  . EUS N/A 02/03/2013  Procedure: LOWER ENDOSCOPIC ULTRASOUND (EUS);  Surgeon: Milus Banister, MD;  Location: [facility];  Service: Endoscopy;  Laterality: N/A;  . ILEOSTOMY CLOSURE N/A 08/24/2013   Procedure: CLOSURE OF LOOP ILEOSTOMY ;  Surgeon: Leighton Ruff, MD;  Location: [facility];  Service: General;   Laterality: N/A;  . LAPAROSCOPIC LOW ANTERIOR RESECTION N/A 05/20/2013   Procedure: LAPAROSCOPIC LOW ANTERIOR RESECTION WITH SPLENIC FLEXURE MOBILIZATION;  Surgeon: Leighton Ruff, MD;  Location: [facility];  Service: General;  Laterality: N/A;  . OSTOMY N/A 05/20/2013   Procedure: diverting OSTOMY;  Surgeon: Leighton Ruff, MD;  Location: [facility];  Service: General;  Laterality: N/A;  . RADIOLOGY WITH ANESTHESIA N/A 06/08/2018   Procedure: IR WITH ANESTHESIA;  Surgeon: Radiologist, Medication, MD;  Location: [facility];  Service: Radiology;  Laterality: N/A;  . VIDEO ASSISTED THORACOSCOPY (VATS)/WEDGE RESECTION Left 11/20/2015   Procedure: VIDEO ASSISTED THORACOSCOPY (VATS)/LUNG RESECTION;  Surgeon: [name], MD;  Location: [facility];  Service: Thoracic;  Laterality: Left;  Marland Kitchen VIDEO BRONCHOSCOPY N/A 11/20/2015   Procedure: VIDEO BRONCHOSCOPY;  Surgeon: [name], MD;  Location: [facility];  Service: Thoracic;  Laterality: N/A;    Family History  Problem Relation Age of Onset  . Stroke Mother   . Hypertension Mother   . Stroke Father   . Hypertension Father   . Hypertension Other   . Colon cancer Neg Hx     Social History:  Married. 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 He  reports that he quit smoking about 40 years ago. His smoking use included cigarettes. He has a 5.00 pack-year smoking history. He has never used smokeless tobacco. He reports that he used to drink alcohol. He reports that he does not use drugs.    Allergies  Allergen Reactions  . Nsaids Other (See Comments)    Asked by surgeon to add this medication class as intolerance due to patients renal insufficiency.  . Amlodipine Other (See Comments)    Edema    Medications Prior to Admission  Medication Sig Dispense Refill  . acetaminophen (TYLENOL) 325 MG tablet Take 650 mg by mouth every 6 (six) hours as needed.    Marland Kitchen allopurinol (ZYLOPRIM) 100 MG tablet Take 1 tablet (100 mg total) by mouth daily. 30 tablet 0  .  calcium carbonate (OSCAL) 1500 (600 Ca) MG TABS tablet Take 600 mg of elemental calcium by mouth daily.    . cholecalciferol (VITAMIN D) 1000 units tablet Take 5,000 Units by mouth daily.    Marland Kitchen doxazosin (CARDURA) 8 MG tablet Take 1 tablet (8 mg total) by mouth daily. 90 tablet 0  . furosemide (LASIX) 80 MG tablet Take 1 tablet (80 mg total) by mouth daily.    Marland Kitchen gabapentin (NEURONTIN) 300 MG capsule Take 1 capsule (300 mg total) by mouth at bedtime. Take 300mg s morning and 600mg s at bedtime    . KLOR-CON M20 20 MEQ tablet Take 1 tablet (20 mEq total) by mouth daily. 90 tablet 1  . loperamide (IMODIUM) 2 MG capsule Take 4 mg by mouth as needed for diarrhea or loose stools.    . metoprolol succinate (TOPROL-XL) 50 MG 24 hr tablet Take 50 mg by mouth daily.    . polycarbophil (FIBERCON) 625 MG tablet Take 1,250 mg by mouth as needed.       Home: Home Living Family/patient expects to be discharged to:: Private residence Living Arrangements: Spouse/significant other Available Help at Discharge: Family, Available 24 hours/day Type of Home: [place]  Access: Stairs to enter [employer] of Steps: 1 Entrance Stairs-Rails: None Home Layout: One level Bathroom Shower/Tub: Optometrist: Yes Home Equipment: None  Lives With: Spouse  Functional History: Prior Function Level of Independence: Independent Functional Status:  Mobility: Bed Mobility Overal bed mobility: Needs Assistance Bed Mobility: Supine to Sit Supine to sit: Mod assist Transfers Overall transfer level: Needs assistance Transfers: Sit to/from Stand, Stand Pivot Transfers Sit to Stand: +2 physical assistance, Mod assist Stand pivot transfers: Mod assist, +2 physical assistance General transfer comment: Difficulty taking steps with LLE but did not appear to buckle.  Pivoted to 3N1 with mod assist due to posterior and left lateral lean.    Ambulation/Gait Ambulation/Gait assistance: Mod assist, +2 physical assistance Gait Distance (Feet): 2 Feet Assistive device: 2 person hand held assist Gait Pattern/deviations: Step-to pattern, Decreased step length - left, Decreased stride length, Decreased weight shift to right, Decreased stance time - right, Ataxic, Leaning posteriorly, Staggering left General Gait Details: Pt having difficulty coordinating steps with left LE.  Needed assist and cues.  Weakness on left LEas well.  Decr postural stabilty overal.     ADL: ADL Overall ADL's : Needs assistance/impaired Eating/Feeding: Minimal assistance Eating/Feeding Details (indicate cue type and reason): spillage form L side aof mouth; cues for safety - pt tipping soup bowl up and drinkin gfrom bowl with spontaneous couhging and pt being unaware of soup spiiling from L side of mouth Grooming: Minimal assistance, Sitting Upper Body Bathing: Minimal assistance, Sitting Lower Body Bathing: Moderate assistance, Sit to/from stand Upper Body Dressing : Moderate assistance, Sitting Lower Body Dressing: Maximal assistance, Sit to/from stand Toilet Transfer: +2 for physical assistance, Moderate assistance, Stand-pivot, BSC Toileting- Clothing Manipulation and Hygiene: Maximal assistance, Sit to/from stand Functional mobility during ADLs: Moderate assistance, +2 for physical assistance  Cognition: Cognition Overall Cognitive Status: Impaired/Different from baseline Arousal/Alertness: Awake/alert Orientation Level: Oriented X4 Attention: Focused, Sustained, Selective Focused Attention: Appears intact Sustained Attention: Appears intact Selective Attention: Appears intact Memory: Impaired Memory Impairment: Decreased recall of new information Awareness: Impaired Awareness Impairment: Emergent impairment Problem Solving: Impaired Problem Solving Impairment: Verbal complex, Functional complex Executive Function: Reasoning Reasoning:  Impaired Reasoning Impairment: Verbal basic Behaviors: Impulsive Safety/Judgment: Appears intact Cognition Arousal/Alertness: Lethargic Behavior During Therapy: Flat affect Overall Cognitive Status: Impaired/Different from baseline Area of Impairment: Safety/judgement, Awareness, Problem solving, Attention Current Attention Level: Selective Safety/Judgement: Decreased awareness of safety, Decreased awareness of deficits Awareness: Emergent Problem Solving: Slow processing   Blood pressure (!) 103/46, pulse 66, temperature 98.4 F (36.9 C), temperature source Oral, resp. rate 14, height 5\' 8"  (1.727 m), weight 83.9 kg, SpO2 100 %. Physical Exam  Nursing note and vitals reviewed. Constitutional: He is oriented to person, place, and time. He appears well-developed and well-nourished. He appears lethargic. He is easily aroused. No distress.  HENT:  Head: Atraumatic.  Eyes: Pupils are equal, round, and reactive to light.  Neck: Normal range of motion.  Cardiovascular: Normal rate.  Respiratory: Effort normal.  GI: Soft.  Neurological: He is oriented to person, place, and time and easily aroused. He appears lethargic.  Left facial weakness with moderate dysarthria. Needed cues to stay awake but able to follow simple motor commands without difficulty. Left inattention but tracks to left. LUE 4/5. LLE 2/5. RUE 5/5. RLE 3+ to 4-/5. No focal sensory deficits  Skin:  Chronic changes in both LE's, scattered areas of skin breakdown    Results for orders placed or performed during  the hospital encounter of 06/08/18 (from the past 24 hour(s))  CBC     Status: Abnormal   Collection Time: 06/10/18  3:20 AM  Result Value Ref Range   WBC 12.2 (H) 4.0 - 10.5 K/uL   RBC 2.95 (L) 4.22 - 5.81 MIL/uL   Hemoglobin 8.1 (L) 13.0 - 17.0 g/dL   HCT 25.3 (L) 39.0 - 52.0 %   MCV 85.8 80.0 - 100.0 fL   MCH 27.5 26.0 - 34.0 pg   MCHC 32.0 30.0 - 36.0 g/dL   RDW 18.9 (H) 11.5 - 15.5 %   Platelets 131 (L)  150 - 400 K/uL   nRBC 0.0 0.0 - 0.2 %  Basic metabolic panel     Status: Abnormal   Collection Time: 06/10/18  3:20 AM  Result Value Ref Range   Sodium 135 135 - 145 mmol/L   Potassium 4.0 3.5 - 5.1 mmol/L   Chloride 101 98 - 111 mmol/L   CO2 20 (L) 22 - 32 mmol/L   Glucose, Bld 96 70 - 99 mg/dL   BUN 71 (H) 8 - 23 mg/dL   Creatinine, Ser 6.69 (H) 0.61 - 1.24 mg/dL   Calcium 9.2 8.9 - 10.3 mg/dL   GFR calc non Af Amer 8 (L) >60 mL/min   GFR calc Af Amer 9 (L) >60 mL/min   Anion gap 14 5 - 15   Dg Chest 1 View  Result Date: 06/08/2018 CLINICAL DATA:  OG tube placement. EXAM: CHEST  1 VIEW COMPARISON:  05/08/2018 and prior radiographs FINDINGS: An endotracheal tube is identified with tip is 4 cm above the carina, and OG tube entering the stomach with tip off the field of view. UPPER limits normal heart size with pulmonary vascular congestion noted. Elevated RIGHT hemidiaphragm again identified. No pneumothorax or definite pleural effusion. IMPRESSION: UPPER limits normal heart size and pulmonary vascular congestion. Support apparatus as described. Electronically Signed   By: Margarette Canada M.D.   On: 06/08/2018 21:18   Mr Jodene Nam Head Wo Contrast  Result Date: 06/09/2018 CLINICAL DATA:  Left hemiparesis and dysarthria. Status post endovascular revascularization of occluded right ICA, right MCA, and proximal right ACA. EXAM: MRI HEAD WITHOUT CONTRAST MRA HEAD WITHOUT CONTRAST TECHNIQUE: Multiplanar, multiecho pulse sequences of the brain and surrounding structures were obtained without intravenous contrast. Angiographic images of the head were obtained using MRA technique without contrast. COMPARISON:  Head CT, CTA, and cerebral perfusion 06/08/2018 FINDINGS: MRI HEAD FINDINGS Brain: There are patchy acute infarcts involving gray and white matter scattered throughout the right MCA territory including extensive involvement of the right basal ganglia. The right insula is also diffusely involved, and there  are additional small acute infarcts in the right temporal, right frontal, and parietal lobes. No associated hemorrhage is seen. There is no intracranial mass effect or extra-axial fluid collection. There is mild cerebral atrophy, and scattered T2 hyperintensities in the cerebral white matter bilaterally are nonspecific but compatible with mild chronic small vessel ischemic disease. A chronic lacunar infarct is noted in the left lentiform nucleus. Vascular: Abnormal appearance of the distal right vertebral artery. Susceptibility from right MCA stent. Skull and upper cervical spine: Mildly heterogeneous bone marrow signal diffusely without destructive lesion. Sinuses/Orbits: Rightward gaze. Mild scattered mucosal thickening in the paranasal sinuses. Right larger than left mastoid effusions. Other: None. MRA HEAD FINDINGS The visualized distal left vertebral artery is patent and supplies the basilar with mild stenosis distally. The right V4 segment is occluded as seen on  yesterday's CTA. The basilar artery is patent with mild diffuse irregularity and up to mild stenosis in its proximal and mid portions. SCAs are patent. There is a patent left posterior communicating artery. PCAs are patent with mild left P1 and severe proximal right P2 stenoses again noted. Lack of signal in the included mid cervical ICA on the right likely reflects artifact from the recently placed stent given normal flow related enhancement more distally. The intracranial internal carotid arteries are patent with mild right and moderate to severe left supraclinoid stenoses. A 2 mm aneurysm projects inferolaterally from the horizontal cavernous ICA on the right. The ACAs are patent with moderate bilateral origin stenoses. The left MCA is patent with branch vessel irregularity but no significant proximal stenosis. The right MCA remains widely patent proximal to the stent. There is variable signal loss involving the more distal right M1 segment related  to the stent which limits assessment for stenosis. Right M2 trunks appear patent proximally, however there is attenuation of more distal right MCA branch vessels with multiple missing branches involving the superior greater than inferior divisions. IMPRESSION: 1. Patchy acute right MCA infarcts with greatest involvement of the basal ganglia. 2. Mild chronic small vessel ischemic disease. 3. Continued patency of the intracranial right ICA, right ACA, and proximal right MCA following revascularization. Artifact from right MCA stent with decreased number of distal MCA branch vessels. 4. Mild right and moderate to severe left supraclinoid ICA stenoses. 5. Moderate bilateral ACA origin stenoses. 6. Occluded distal right vertebral artery. 7. Severe proximal right P2 stenosis. 8. 2 mm right cavernous ICA aneurysm. Electronically Signed   By: Logan Bores M.D.   On: 06/09/2018 14:09   Mr Brain Wo Contrast  Result Date: 06/09/2018 CLINICAL DATA:  Left hemiparesis and dysarthria. Status post endovascular revascularization of occluded right ICA, right MCA, and proximal right ACA. EXAM: MRI HEAD WITHOUT CONTRAST MRA HEAD WITHOUT CONTRAST TECHNIQUE: Multiplanar, multiecho pulse sequences of the brain and surrounding structures were obtained without intravenous contrast. Angiographic images of the head were obtained using MRA technique without contrast. COMPARISON:  Head CT, CTA, and cerebral perfusion 06/08/2018 FINDINGS: MRI HEAD FINDINGS Brain: There are patchy acute infarcts involving gray and white matter scattered throughout the right MCA territory including extensive involvement of the right basal ganglia. The right insula is also diffusely involved, and there are additional small acute infarcts in the right temporal, right frontal, and parietal lobes. No associated hemorrhage is seen. There is no intracranial mass effect or extra-axial fluid collection. There is mild cerebral atrophy, and scattered T2 hyperintensities  in the cerebral white matter bilaterally are nonspecific but compatible with mild chronic small vessel ischemic disease. A chronic lacunar infarct is noted in the left lentiform nucleus. Vascular: Abnormal appearance of the distal right vertebral artery. Susceptibility from right MCA stent. Skull and upper cervical spine: Mildly heterogeneous bone marrow signal diffusely without destructive lesion. Sinuses/Orbits: Rightward gaze. Mild scattered mucosal thickening in the paranasal sinuses. Right larger than left mastoid effusions. Other: None. MRA HEAD FINDINGS The visualized distal left vertebral artery is patent and supplies the basilar with mild stenosis distally. The right V4 segment is occluded as seen on yesterday's CTA. The basilar artery is patent with mild diffuse irregularity and up to mild stenosis in its proximal and mid portions. SCAs are patent. There is a patent left posterior communicating artery. PCAs are patent with mild left P1 and severe proximal right P2 stenoses again noted. Lack of signal in the included  mid cervical ICA on the right likely reflects artifact from the recently placed stent given normal flow related enhancement more distally. The intracranial internal carotid arteries are patent with mild right and moderate to severe left supraclinoid stenoses. A 2 mm aneurysm projects inferolaterally from the horizontal cavernous ICA on the right. The ACAs are patent with moderate bilateral origin stenoses. The left MCA is patent with branch vessel irregularity but no significant proximal stenosis. The right MCA remains widely patent proximal to the stent. There is variable signal loss involving the more distal right M1 segment related to the stent which limits assessment for stenosis. Right M2 trunks appear patent proximally, however there is attenuation of more distal right MCA branch vessels with multiple missing branches involving the superior greater than inferior divisions. IMPRESSION: 1.  Patchy acute right MCA infarcts with greatest involvement of the basal ganglia. 2. Mild chronic small vessel ischemic disease. 3. Continued patency of the intracranial right ICA, right ACA, and proximal right MCA following revascularization. Artifact from right MCA stent with decreased number of distal MCA branch vessels. 4. Mild right and moderate to severe left supraclinoid ICA stenoses. 5. Moderate bilateral ACA origin stenoses. 6. Occluded distal right vertebral artery. 7. Severe proximal right P2 stenosis. 8. 2 mm right cavernous ICA aneurysm. Electronically Signed   By: Logan Bores M.D.   On: 06/09/2018 14:09   Dg Chest Port 1 View  Result Date: 06/10/2018 CLINICAL DATA:  Congestive heart failure EXAM: PORTABLE CHEST 1 VIEW COMPARISON:  Chest x-ray of 06/08/2017 FINDINGS: The endotracheal tube and NG tube have been removed. Aeration of the lungs has improved. No definite pulmonary vascular congestion or CHF is seen and no effusion is noted. Mild cardiomegaly is stable. IMPRESSION: Endotracheal tube removed.  Resolution of CHF. Electronically Signed   By: [name] M.D.   On: 06/10/2018 15:02   Dg Abd Portable 1v  Result Date: 06/08/2018 CLINICAL DATA:  OG tube placement. EXAM: PORTABLE ABDOMEN - 1 VIEW COMPARISON:  None. FINDINGS: An OG tube is identified with tip overlying the distal stomach/proximal duodenum. Bowel gas pattern is unremarkable. IMPRESSION: OG tube with tip overlying the distal stomach/proximal duodenum. Electronically Signed   By: Margarette Canada M.D.   On: 06/08/2018 21:19   Vas [place] Lower Extremity Venous (dvt)  Result Date: 06/10/2018  Lower Venous Study Indications: Stroke.  Limitations: Bedrest. Performing Technologist: Maudry Mayhew MHA, RDMS, RVT, RDCS  Examination Guidelines: A complete evaluation includes B-mode imaging, spectral Doppler, color Doppler, and power Doppler as needed of all accessible portions of each vessel. Bilateral testing is considered an integral  part of a complete examination. Limited examinations for reoccurring indications may be performed as noted.  Right Venous Findings: +---------+---------------+---------+-----------+----------+-------------------+          CompressibilityPhasicitySpontaneityPropertiesSummary             +---------+---------------+---------+-----------+----------+-------------------+ CFV                                                   Unable to visualize +---------+---------------+---------+-----------+----------+-------------------+ SFJ                                                   Unable to visualize +---------+---------------+---------+-----------+----------+-------------------+  FV Prox  Full                                                             +---------+---------------+---------+-----------+----------+-------------------+ FV Mid   Full                                                             +---------+---------------+---------+-----------+----------+-------------------+ FV DistalFull                                                             +---------+---------------+---------+-----------+----------+-------------------+ PFV                                                   Unable to                                                                 adequately                                                                visualize           +---------+---------------+---------+-----------+----------+-------------------+ POP                     Yes      Yes                                      +---------+---------------+---------+-----------+----------+-------------------+ PTV      Full                                                             +---------+---------------+---------+-----------+----------+-------------------+ PERO     Full                                                              +---------+---------------+---------+-----------+----------+-------------------+  Left Venous Findings: +---------+---------------+---------+-----------+----------+-------------------+  CompressibilityPhasicitySpontaneityPropertiesSummary             +---------+---------------+---------+-----------+----------+-------------------+ CFV                                                   Unable to visualize +---------+---------------+---------+-----------+----------+-------------------+ SFJ                                                   Unable to visualize +---------+---------------+---------+-----------+----------+-------------------+ FV Prox  Full                                                             +---------+---------------+---------+-----------+----------+-------------------+ FV Mid   Full                                                             +---------+---------------+---------+-----------+----------+-------------------+ FV DistalFull                                                             +---------+---------------+---------+-----------+----------+-------------------+ PFV      Full                                                             +---------+---------------+---------+-----------+----------+-------------------+ POP      Full           Yes      Yes                                      +---------+---------------+---------+-----------+----------+-------------------+ PTV      Full                                                             +---------+---------------+---------+-----------+----------+-------------------+ PERO     Full                                                             +---------+---------------+---------+-----------+----------+-------------------+    Summary: Right: There is no evidence of deep vein thrombosis in the lower extremity.  However, portions of this examination were limited- see  technologist comments above. No cystic structure found in the popliteal fossa. Left: There is no evidence of deep vein thrombosis in the lower extremity. However, portions of this examination were limited- see technologist comments above. No cystic structure found in the popliteal fossa.  *See table(s) above for measurements and observations. Electronically signed by Monica Martinez MD on 06/10/2018 at 3:48:22 PM.    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 Does the need for close, 24 hr/day medical supervision in concert with the patient's rehab needs make it unreasonable for this patient to be served in a less intensive setting? Yes 2. Co-Morbidities requiring supervision/potential complications: HTN, pulmonary htn, ckd, peripheral neuropathy 3. Due to bladder management, bowel management, safety, skin/wound care, disease management, medication administration, pain management and patient education, does the patient require 24 hr/day rehab nursing? Yes 4. Does the patient require coordinated care of a physician, rehab nurse, PT (1-2 hrs/day, 5 days/week), OT (1-2 hrs/day, 5 days/week) and SLP (1-2 hrs/day, 5 days/week) to address physical and functional deficits in the context of the above medical diagnosis(es)? Yes Addressing deficits in the following areas: balance, endurance, locomotion, strength, transferring, bowel/bladder control, bathing, dressing, feeding, grooming, toileting, cognition, speech, swallowing and psychosocial support 5. Can the patient actively participate in an intensive therapy program of at least 3 hrs of therapy per day at least 5 days per week? Yes 6. The potential for patient to make measurable gains while on inpatient rehab is excellent 7. Anticipated functional outcomes upon discharge from inpatient rehab are supervision  with PT, supervision and min assist with OT, modified independent and supervision with  SLP. 8. Estimated rehab length of stay to reach the above functional goals is: 15-21 days 9. Anticipated D/C setting: Home 10. Anticipated post D/C treatments: [place] therapy 11. Overall Rehab/Functional Prognosis: excellent  RECOMMENDATIONS: This patient's condition is appropriate for continued rehabilitative care in the following setting: CIR Patient has agreed to participate in recommended program. Yes Note that insurance prior authorization may be required for reimbursement for recommended care.  Comment: Rehab Admissions Coordinator to follow up.  Thanks,  Meredith Staggers, MD, [facility]  I have personally performed a face to face diagnostic evaluation of this patient. Additionally, I have reviewed and concur with the physician assistant's documentation above.    Bary Leriche, PA-C 06/10/2018

## 2018-06-10 NOTE — Progress Notes (Signed)
Referring Physician(s): CODE STROKE- Amie Portland  Supervising Physician: Luanne Bras  Patient Status:  [name] - In-pt  Chief Complaint:  Right ICA proximal occlusion s/p revascularization using stent assisted angioplasty 06/08/2018 by Dr. Estanislado Pandy.  Right MCA and right ACA proximal occlusions s/p emergent mechanical thrombectomy and stent placement achieving a TICI 2b revascularization 06/08/2018 by Dr. Estanislado Pandy.  Subjective:  Pt awake. Seems to be doing better today. He is trying to drink juice. He has a little pooling in the left cheek and some dribbling of liquid the left.  He understands he suffered a stroke.  Allergies: Nsaids and Amlodipine  Medications: Prior to Admission medications   Medication Sig Start Date End Date Taking? Authorizing Provider  acetaminophen (TYLENOL) 325 MG tablet Take 650 mg by mouth every 6 (six) hours as needed.   Yes [provider]  allopurinol (ZYLOPRIM) 100 MG tablet Take 1 tablet (100 mg total) by mouth daily. 05/11/18  Yes Mikhail, Velta Addison, DO  calcium carbonate (OSCAL) 1500 (600 Ca) MG TABS tablet Take 600 mg of elemental calcium by mouth daily.   Yes [provider]  cholecalciferol (VITAMIN D) 1000 units tablet Take 5,000 Units by mouth daily.   Yes [provider]  doxazosin (CARDURA) 8 MG tablet Take 1 tablet (8 mg total) by mouth daily. 02/05/18  Yes [name], MD  furosemide (LASIX) 80 MG tablet Take 1 tablet (80 mg total) by mouth daily. 05/12/18  Yes Mikhail, Maryann, DO  gabapentin (NEURONTIN) 300 MG capsule Take 1 capsule (300 mg total) by mouth at bedtime. Take 300mg s morning and 600mg s at bedtime 05/10/18  Yes Mikhail, Maryann, DO  KLOR-CON M20 20 MEQ tablet Take 1 tablet (20 mEq total) by mouth daily. 12/11/17  Yes [name], MD  loperamide (IMODIUM) 2 MG capsule Take 4 mg by mouth as needed for diarrhea or loose stools.   Yes [provider]  metoprolol succinate (TOPROL-XL) 50  MG 24 hr tablet Take 50 mg by mouth daily. 04/15/18  Yes [provider]  polycarbophil (FIBERCON) 625 MG tablet Take 1,250 mg by mouth as needed.    Yes [provider]  gabapentin (NEURONTIN) 300 MG capsule Take 1 capsule (300 mg total) by mouth 3 (three) times daily. 06/09/18   Wallene Huh, DPM     Vital Signs: BP 101/70   Pulse 80   Temp 97.9 F (36.6 C) (Oral)   Resp (!) 31   Ht 5\' 8"  (1.727 m)   Wt 83.9 kg   SpO2 100%   BMI 28.12 kg/m   Physical Exam Awake, A & O. He knows he is in [place]. No further bleeding per nurse. He is able to follow simple commands. Able to move left side but still weak.  Imaging: Ct Angio Head W Or Wo Contrast  Result Date: 06/08/2018 CLINICAL DATA:  72 year old male code stroke presentation with hyperdense right M1. History of rectal cancer with treated lung metastases. EXAM: CT ANGIOGRAPHY HEAD AND NECK CT PERFUSION BRAIN TECHNIQUE: Multidetector CT imaging of the head and neck was performed using the standard protocol during bolus administration of intravenous contrast. Multiplanar CT image reconstructions and MIPs were obtained to evaluate the vascular anatomy. Carotid stenosis measurements (when applicable) are obtained utilizing NASCET criteria, using the distal internal carotid diameter as the denominator. Multiphase CT imaging of the brain was performed following IV bolus contrast injection. Subsequent parametric perfusion maps were calculated using RAPID software. CONTRAST:  175mL ISOVUE-370  IOPAMIDOL (ISOVUE-370) INJECTION 76% COMPARISON:  Noncontrast head CT 1521 hours today. Chest CT 12/01/2017. FINDINGS: CT Brain Perfusion Findings: CBF (<30%) Volume: 13 milliliters Perfusion (Tmax>6.0s) volume: 132 milliliters (hypoperfusion index of 0.5) Mismatch Volume: 119 milliliters (mismatch ratio of 10.2 Infarction Location:Deep right MCA territory white matter CTA NECK Skeleton: Absent dentition. No acute osseous abnormality  identified. Upper chest: Stable partially calcified posterior left upper lobe lung nodule measuring 2-3 centimeters. Peripheral right upper lobe nodularity and pleural thickening also appears stable. No superior mediastinal lymphadenopathy. Other neck: Left nasal airway in place.  Otherwise negative. Aortic arch: 3 vessel arch configuration with mild for age arch atherosclerosis. Right carotid system: Negative brachiocephalic and right CCA origin. The right CCA is patent, but at the right carotid bifurcation the right ICA is abruptly occluded in the mid bulb as seen on series 10, image 69. Mild to moderate superimposed right ICA origin plaque. No reconstituted enhancement in the neck. Left carotid system: Negative left CCA origin. Mildly tortuous left CCA. Soft and calcified plaque at the left carotid bifurcation, left ICA origin and bulb resulting in less than 50 % stenosis with respect to the distal vessel. Vertebral arteries: No proximal right subclavian artery stenosis despite mild plaque. There is low-density plaque or thrombus in the right vertebral artery origin and V1 segment resulting in near occlusion (series 9, image 142),. The distal right V1 segment then resumes enhancing but there is poor enhancement throughout the right vertebral artery to the skull base. Mild plaque at the left subclavian artery origin without stenosis. More bulky soft plaque at the proximal left subclavian near the left vertebral artery origin as demonstrated on series 8, image 272. The left vertebral origin is patent, but there is evidence of some left V1 segment soft plaque and stenosis as seen on series 9, image 160. Elsewhere in the neck the and appears normal left vertebral. Artery is patent patent distal CTA HEAD Posterior circulation: Left vertebral artery with calcified plaque resulting in mild to moderate stenosis. The right V4 segment is occluded. The basilar artery is patent although irregular throughout much of its  course. There is no high-grade basilar stenosis. The SCA and PCA origins are patent. There is mild irregularity and stenosis at the left PCA origin (series 12, image 24). Posterior communicating arteries are diminutive or absent. There is contralateral moderate to severe right PCA P1/P2 junction stenosis (series 11, image 21). However, bilateral distal PCA enhancement is symmetric and within normal limits. Anterior circulation: The right ICA siphon is occluded with superimposed calcified plaque. The right ICA terminus is occluded. There is no right MCA M1 or bifurcation enhancement. There is minimal distal right MCA branch enhancement. The left ICA siphon is patent with calcified plaque resulting in moderate to severe supraclinoid segment stenosis as seen on series 10, image 107. The left ICA terminus is patent. The left MCA and ACA origins are normal. The anterior communicating artery is diminutive but patent and appears to supply the right ACA A2 and distal A1 segments. There is mild bilateral ACA irregularity. The left MCA M1 segment and left MCA bifurcation are patent without stenosis. Left MCA branches are mildly irregular. Venous sinuses: Not evaluated due to early contrast timing. Anatomic variants: None. Review of the MIP images confirms the above findings IMPRESSION: 1. Positive for LVO (occluded Right ICA, Right ICA terminus and Right MCA) with CT Perfusion criteria favorable for endovascular reperfusion (core infarct estimated at 13 mL with mismatch volume 119 mL, mismatch ratio of  10.2). Preliminary report of this was discussed by telephone with Dr. Amie Portland on 06/08/2018 at 15:46. 2. Positive also for occluded Right Vertebral Artery and soft plaque or thrombus at the Left Vertebral Artery origin. In conjunction with #1 this might reflect sequelae of cardiac or paradoxical emboli. This was discussed with Dr. Juliet Rude in [place] at 1359 hours. 3. Superimposed intracranial atherosclerosis including  moderate or severe stenosis of the: - Left ICA supraclinoid segment. - Left vertebral artery V4 segment. - Right PCA P1/P2. 4. Stable visible upper chest since the CT on 12/01/2017. Electronically Signed   By: Genevie Ann M.D.   On: 06/08/2018 16:05   Dg Chest 1 View  Result Date: 06/08/2018 CLINICAL DATA:  OG tube placement. EXAM: CHEST  1 VIEW COMPARISON:  05/08/2018 and prior radiographs FINDINGS: An endotracheal tube is identified with tip is 4 cm above the carina, and OG tube entering the stomach with tip off the field of view. UPPER limits normal heart size with pulmonary vascular congestion noted. Elevated RIGHT hemidiaphragm again identified. No pneumothorax or definite pleural effusion. IMPRESSION: UPPER limits normal heart size and pulmonary vascular congestion. Support apparatus as described. Electronically Signed   By: Margarette Canada M.D.   On: 06/08/2018 21:18   Ct Angio Neck W Or Wo Contrast  Result Date: 06/08/2018 CLINICAL DATA:  72 year old male code stroke presentation with hyperdense right M1. History of rectal cancer with treated lung metastases. EXAM: CT ANGIOGRAPHY HEAD AND NECK CT PERFUSION BRAIN TECHNIQUE: Multidetector CT imaging of the head and neck was performed using the standard protocol during bolus administration of intravenous contrast. Multiplanar CT image reconstructions and MIPs were obtained to evaluate the vascular anatomy. Carotid stenosis measurements (when applicable) are obtained utilizing NASCET criteria, using the distal internal carotid diameter as the denominator. Multiphase CT imaging of the brain was performed following IV bolus contrast injection. Subsequent parametric perfusion maps were calculated using RAPID software. CONTRAST:  186mL ISOVUE-370 IOPAMIDOL (ISOVUE-370) INJECTION 76% COMPARISON:  Noncontrast head CT 1521 hours today. Chest CT 12/01/2017. FINDINGS: CT Brain Perfusion Findings: CBF (<30%) Volume: 13 milliliters Perfusion (Tmax>6.0s) volume: 132  milliliters (hypoperfusion index of 0.5) Mismatch Volume: 119 milliliters (mismatch ratio of 10.2 Infarction Location:Deep right MCA territory white matter CTA NECK Skeleton: Absent dentition. No acute osseous abnormality identified. Upper chest: Stable partially calcified posterior left upper lobe lung nodule measuring 2-3 centimeters. Peripheral right upper lobe nodularity and pleural thickening also appears stable. No superior mediastinal lymphadenopathy. Other neck: Left nasal airway in place.  Otherwise negative. Aortic arch: 3 vessel arch configuration with mild for age arch atherosclerosis. Right carotid system: Negative brachiocephalic and right CCA origin. The right CCA is patent, but at the right carotid bifurcation the right ICA is abruptly occluded in the mid bulb as seen on series 10, image 69. Mild to moderate superimposed right ICA origin plaque. No reconstituted enhancement in the neck. Left carotid system: Negative left CCA origin. Mildly tortuous left CCA. Soft and calcified plaque at the left carotid bifurcation, left ICA origin and bulb resulting in less than 50 % stenosis with respect to the distal vessel. Vertebral arteries: No proximal right subclavian artery stenosis despite mild plaque. There is low-density plaque or thrombus in the right vertebral artery origin and V1 segment resulting in near occlusion (series 9, image 142),. The distal right V1 segment then resumes enhancing but there is poor enhancement throughout the right vertebral artery to the skull base. Mild plaque at the left subclavian artery origin  without stenosis. More bulky soft plaque at the proximal left subclavian near the left vertebral artery origin as demonstrated on series 8, image 272. The left vertebral origin is patent, but there is evidence of some left V1 segment soft plaque and stenosis as seen on series 9, image 160. Elsewhere in the neck the and appears normal left vertebral. Artery is patent patent distal CTA  HEAD Posterior circulation: Left vertebral artery with calcified plaque resulting in mild to moderate stenosis. The right V4 segment is occluded. The basilar artery is patent although irregular throughout much of its course. There is no high-grade basilar stenosis. The SCA and PCA origins are patent. There is mild irregularity and stenosis at the left PCA origin (series 12, image 24). Posterior communicating arteries are diminutive or absent. There is contralateral moderate to severe right PCA P1/P2 junction stenosis (series 11, image 21). However, bilateral distal PCA enhancement is symmetric and within normal limits. Anterior circulation: The right ICA siphon is occluded with superimposed calcified plaque. The right ICA terminus is occluded. There is no right MCA M1 or bifurcation enhancement. There is minimal distal right MCA branch enhancement. The left ICA siphon is patent with calcified plaque resulting in moderate to severe supraclinoid segment stenosis as seen on series 10, image 107. The left ICA terminus is patent. The left MCA and ACA origins are normal. The anterior communicating artery is diminutive but patent and appears to supply the right ACA A2 and distal A1 segments. There is mild bilateral ACA irregularity. The left MCA M1 segment and left MCA bifurcation are patent without stenosis. Left MCA branches are mildly irregular. Venous sinuses: Not evaluated due to early contrast timing. Anatomic variants: None. Review of the MIP images confirms the above findings IMPRESSION: 1. Positive for LVO (occluded Right ICA, Right ICA terminus and Right MCA) with CT Perfusion criteria favorable for endovascular reperfusion (core infarct estimated at 13 mL with mismatch volume 119 mL, mismatch ratio of 10.2). Preliminary report of this was discussed by telephone with Dr. Amie Portland on 06/08/2018 at 15:46. 2. Positive also for occluded Right Vertebral Artery and soft plaque or thrombus at the Left Vertebral  Artery origin. In conjunction with #1 this might reflect sequelae of cardiac or paradoxical emboli. This was discussed with Dr. Juliet Rude in [place] at 1359 hours. 3. Superimposed intracranial atherosclerosis including moderate or severe stenosis of the: - Left ICA supraclinoid segment. - Left vertebral artery V4 segment. - Right PCA P1/P2. 4. Stable visible upper chest since the CT on 12/01/2017. Electronically Signed   By: Genevie Ann M.D.   On: 06/08/2018 16:05   Mr Jodene Nam Head Wo Contrast  Result Date: 06/09/2018 CLINICAL DATA:  Left hemiparesis and dysarthria. Status post endovascular revascularization of occluded right ICA, right MCA, and proximal right ACA. EXAM: MRI HEAD WITHOUT CONTRAST MRA HEAD WITHOUT CONTRAST TECHNIQUE: Multiplanar, multiecho pulse sequences of the brain and surrounding structures were obtained without intravenous contrast. Angiographic images of the head were obtained using MRA technique without contrast. COMPARISON:  Head CT, CTA, and cerebral perfusion 06/08/2018 FINDINGS: MRI HEAD FINDINGS Brain: There are patchy acute infarcts involving gray and white matter scattered throughout the right MCA territory including extensive involvement of the right basal ganglia. The right insula is also diffusely involved, and there are additional small acute infarcts in the right temporal, right frontal, and parietal lobes. No associated hemorrhage is seen. There is no intracranial mass effect or extra-axial fluid collection. There is mild cerebral atrophy, and scattered  T2 hyperintensities in the cerebral white matter bilaterally are nonspecific but compatible with mild chronic small vessel ischemic disease. A chronic lacunar infarct is noted in the left lentiform nucleus. Vascular: Abnormal appearance of the distal right vertebral artery. Susceptibility from right MCA stent. Skull and upper cervical spine: Mildly heterogeneous bone marrow signal diffusely without destructive lesion. Sinuses/Orbits:  Rightward gaze. Mild scattered mucosal thickening in the paranasal sinuses. Right larger than left mastoid effusions. Other: None. MRA HEAD FINDINGS The visualized distal left vertebral artery is patent and supplies the basilar with mild stenosis distally. The right V4 segment is occluded as seen on yesterday's CTA. The basilar artery is patent with mild diffuse irregularity and up to mild stenosis in its proximal and mid portions. SCAs are patent. There is a patent left posterior communicating artery. PCAs are patent with mild left P1 and severe proximal right P2 stenoses again noted. Lack of signal in the included mid cervical ICA on the right likely reflects artifact from the recently placed stent given normal flow related enhancement more distally. The intracranial internal carotid arteries are patent with mild right and moderate to severe left supraclinoid stenoses. A 2 mm aneurysm projects inferolaterally from the horizontal cavernous ICA on the right. The ACAs are patent with moderate bilateral origin stenoses. The left MCA is patent with branch vessel irregularity but no significant proximal stenosis. The right MCA remains widely patent proximal to the stent. There is variable signal loss involving the more distal right M1 segment related to the stent which limits assessment for stenosis. Right M2 trunks appear patent proximally, however there is attenuation of more distal right MCA branch vessels with multiple missing branches involving the superior greater than inferior divisions. IMPRESSION: 1. Patchy acute right MCA infarcts with greatest involvement of the basal ganglia. 2. Mild chronic small vessel ischemic disease. 3. Continued patency of the intracranial right ICA, right ACA, and proximal right MCA following revascularization. Artifact from right MCA stent with decreased number of distal MCA branch vessels. 4. Mild right and moderate to severe left supraclinoid ICA stenoses. 5. Moderate bilateral ACA  origin stenoses. 6. Occluded distal right vertebral artery. 7. Severe proximal right P2 stenosis. 8. 2 mm right cavernous ICA aneurysm. Electronically Signed   By: Logan Bores M.D.   On: 06/09/2018 14:09   Mr Brain Wo Contrast  Result Date: 06/09/2018 CLINICAL DATA:  Left hemiparesis and dysarthria. Status post endovascular revascularization of occluded right ICA, right MCA, and proximal right ACA. EXAM: MRI HEAD WITHOUT CONTRAST MRA HEAD WITHOUT CONTRAST TECHNIQUE: Multiplanar, multiecho pulse sequences of the brain and surrounding structures were obtained without intravenous contrast. Angiographic images of the head were obtained using MRA technique without contrast. COMPARISON:  Head CT, CTA, and cerebral perfusion 06/08/2018 FINDINGS: MRI HEAD FINDINGS Brain: There are patchy acute infarcts involving gray and white matter scattered throughout the right MCA territory including extensive involvement of the right basal ganglia. The right insula is also diffusely involved, and there are additional small acute infarcts in the right temporal, right frontal, and parietal lobes. No associated hemorrhage is seen. There is no intracranial mass effect or extra-axial fluid collection. There is mild cerebral atrophy, and scattered T2 hyperintensities in the cerebral white matter bilaterally are nonspecific but compatible with mild chronic small vessel ischemic disease. A chronic lacunar infarct is noted in the left lentiform nucleus. Vascular: Abnormal appearance of the distal right vertebral artery. Susceptibility from right MCA stent. Skull and upper cervical spine: Mildly heterogeneous bone marrow signal  diffusely without destructive lesion. Sinuses/Orbits: Rightward gaze. Mild scattered mucosal thickening in the paranasal sinuses. Right larger than left mastoid effusions. Other: None. MRA HEAD FINDINGS The visualized distal left vertebral artery is patent and supplies the basilar with mild stenosis distally. The  right V4 segment is occluded as seen on yesterday's CTA. The basilar artery is patent with mild diffuse irregularity and up to mild stenosis in its proximal and mid portions. SCAs are patent. There is a patent left posterior communicating artery. PCAs are patent with mild left P1 and severe proximal right P2 stenoses again noted. Lack of signal in the included mid cervical ICA on the right likely reflects artifact from the recently placed stent given normal flow related enhancement more distally. The intracranial internal carotid arteries are patent with mild right and moderate to severe left supraclinoid stenoses. A 2 mm aneurysm projects inferolaterally from the horizontal cavernous ICA on the right. The ACAs are patent with moderate bilateral origin stenoses. The left MCA is patent with branch vessel irregularity but no significant proximal stenosis. The right MCA remains widely patent proximal to the stent. There is variable signal loss involving the more distal right M1 segment related to the stent which limits assessment for stenosis. Right M2 trunks appear patent proximally, however there is attenuation of more distal right MCA branch vessels with multiple missing branches involving the superior greater than inferior divisions. IMPRESSION: 1. Patchy acute right MCA infarcts with greatest involvement of the basal ganglia. 2. Mild chronic small vessel ischemic disease. 3. Continued patency of the intracranial right ICA, right ACA, and proximal right MCA following revascularization. Artifact from right MCA stent with decreased number of distal MCA branch vessels. 4. Mild right and moderate to severe left supraclinoid ICA stenoses. 5. Moderate bilateral ACA origin stenoses. 6. Occluded distal right vertebral artery. 7. Severe proximal right P2 stenosis. 8. 2 mm right cavernous ICA aneurysm. Electronically Signed   By: Logan Bores M.D.   On: 06/09/2018 14:09   Ct Cerebral Perfusion W Contrast  Result Date:  06/08/2018 CLINICAL DATA:  72 year old male code stroke presentation with hyperdense right M1. History of rectal cancer with treated lung metastases. EXAM: CT ANGIOGRAPHY HEAD AND NECK CT PERFUSION BRAIN TECHNIQUE: Multidetector CT imaging of the head and neck was performed using the standard protocol during bolus administration of intravenous contrast. Multiplanar CT image reconstructions and MIPs were obtained to evaluate the vascular anatomy. Carotid stenosis measurements (when applicable) are obtained utilizing NASCET criteria, using the distal internal carotid diameter as the denominator. Multiphase CT imaging of the brain was performed following IV bolus contrast injection. Subsequent parametric perfusion maps were calculated using RAPID software. CONTRAST:  117mL ISOVUE-370 IOPAMIDOL (ISOVUE-370) INJECTION 76% COMPARISON:  Noncontrast head CT 1521 hours today. Chest CT 12/01/2017. FINDINGS: CT Brain Perfusion Findings: CBF (<30%) Volume: 13 milliliters Perfusion (Tmax>6.0s) volume: 132 milliliters (hypoperfusion index of 0.5) Mismatch Volume: 119 milliliters (mismatch ratio of 10.2 Infarction Location:Deep right MCA territory white matter CTA NECK Skeleton: Absent dentition. No acute osseous abnormality identified. Upper chest: Stable partially calcified posterior left upper lobe lung nodule measuring 2-3 centimeters. Peripheral right upper lobe nodularity and pleural thickening also appears stable. No superior mediastinal lymphadenopathy. Other neck: Left nasal airway in place.  Otherwise negative. Aortic arch: 3 vessel arch configuration with mild for age arch atherosclerosis. Right carotid system: Negative brachiocephalic and right CCA origin. The right CCA is patent, but at the right carotid bifurcation the right ICA is abruptly occluded in the mid bulb  as seen on series 10, image 69. Mild to moderate superimposed right ICA origin plaque. No reconstituted enhancement in the neck. Left carotid system:  Negative left CCA origin. Mildly tortuous left CCA. Soft and calcified plaque at the left carotid bifurcation, left ICA origin and bulb resulting in less than 50 % stenosis with respect to the distal vessel. Vertebral arteries: No proximal right subclavian artery stenosis despite mild plaque. There is low-density plaque or thrombus in the right vertebral artery origin and V1 segment resulting in near occlusion (series 9, image 142),. The distal right V1 segment then resumes enhancing but there is poor enhancement throughout the right vertebral artery to the skull base. Mild plaque at the left subclavian artery origin without stenosis. More bulky soft plaque at the proximal left subclavian near the left vertebral artery origin as demonstrated on series 8, image 272. The left vertebral origin is patent, but there is evidence of some left V1 segment soft plaque and stenosis as seen on series 9, image 160. Elsewhere in the neck the and appears normal left vertebral. Artery is patent patent distal CTA HEAD Posterior circulation: Left vertebral artery with calcified plaque resulting in mild to moderate stenosis. The right V4 segment is occluded. The basilar artery is patent although irregular throughout much of its course. There is no high-grade basilar stenosis. The SCA and PCA origins are patent. There is mild irregularity and stenosis at the left PCA origin (series 12, image 24). Posterior communicating arteries are diminutive or absent. There is contralateral moderate to severe right PCA P1/P2 junction stenosis (series 11, image 21). However, bilateral distal PCA enhancement is symmetric and within normal limits. Anterior circulation: The right ICA siphon is occluded with superimposed calcified plaque. The right ICA terminus is occluded. There is no right MCA M1 or bifurcation enhancement. There is minimal distal right MCA branch enhancement. The left ICA siphon is patent with calcified plaque resulting in moderate to  severe supraclinoid segment stenosis as seen on series 10, image 107. The left ICA terminus is patent. The left MCA and ACA origins are normal. The anterior communicating artery is diminutive but patent and appears to supply the right ACA A2 and distal A1 segments. There is mild bilateral ACA irregularity. The left MCA M1 segment and left MCA bifurcation are patent without stenosis. Left MCA branches are mildly irregular. Venous sinuses: Not evaluated due to early contrast timing. Anatomic variants: None. Review of the MIP images confirms the above findings IMPRESSION: 1. Positive for LVO (occluded Right ICA, Right ICA terminus and Right MCA) with CT Perfusion criteria favorable for endovascular reperfusion (core infarct estimated at 13 mL with mismatch volume 119 mL, mismatch ratio of 10.2). Preliminary report of this was discussed by telephone with Dr. Amie Portland on 06/08/2018 at 15:46. 2. Positive also for occluded Right Vertebral Artery and soft plaque or thrombus at the Left Vertebral Artery origin. In conjunction with #1 this might reflect sequelae of cardiac or paradoxical emboli. This was discussed with Dr. Juliet Rude in [place] at 1359 hours. 3. Superimposed intracranial atherosclerosis including moderate or severe stenosis of the: - Left ICA supraclinoid segment. - Left vertebral artery V4 segment. - Right PCA P1/P2. 4. Stable visible upper chest since the CT on 12/01/2017. Electronically Signed   By: Genevie Ann M.D.   On: 06/08/2018 16:05   Dg Abd Portable 1v  Result Date: 06/08/2018 CLINICAL DATA:  OG tube placement. EXAM: PORTABLE ABDOMEN - 1 VIEW COMPARISON:  None. FINDINGS: An OG tube  is identified with tip overlying the distal stomach/proximal duodenum. Bowel gas pattern is unremarkable. IMPRESSION: OG tube with tip overlying the distal stomach/proximal duodenum. Electronically Signed   By: Margarette Canada M.D.   On: 06/08/2018 21:19   Ct Head Code Stroke Wo Contrast  Result Date:  06/08/2018 CLINICAL DATA:  Code stroke.  72 year old male. Rightward gaze. EXAM: CT HEAD WITHOUT CONTRAST TECHNIQUE: Contiguous axial images were obtained from the base of the skull through the vertex without intravenous contrast. COMPARISON:  None. FINDINGS: Brain: Asymmetric loss of gray-white matter differentiation at the right insula on series 3, image 17. No other cytotoxic edema identified in the right MCA territory. No midline shift, ventriculomegaly, mass effect, evidence of mass lesion, intracranial hemorrhage. Patchy bilateral white matter hypodensity. Vascular: Calcified atherosclerosis at the skull base. Hyperdense right MCA M1 segment as seen on series 5, image 26. Skull: No acute osseous abnormality identified. Sinuses/Orbits: left nasal airway. Paranasal sinuses are well pneumatized, mild right maxillary mucosal thickening. Mild right mastoid effusion. Tympanic cavities remain clear. Other: Mild rightward gaze deviation. No other acute orbit or scalp soft tissue findings. ASPECTS [facility] Stroke Program Early CT Score) - Ganglionic level infarction (caudate, lentiform nuclei, internal capsule, insula, M1-M3 cortex): 6 - Supraganglionic infarction (M4-M6 cortex): Total score (0-10 with 10 being normal): IMPRESSION: 1. Hyperdense Right MCA M1 segment compatible with ELVO. Subtle cytotoxic edema suspected in the right insula, ASPECTS is 9. 2. Negative for bleed. 3. These results were communicated to Dr. Rory Percy at 3:33 pmon 12/3/2019by text page via the [facility] messaging system. Electronically Signed   By: Genevie Ann M.D.   On: 06/08/2018 15:34   Vas [place] Lower Extremity Venous (dvt)  Result Date: 06/09/2018  Lower Venous Study Indications: Stroke.  Limitations: Bedrest. Performing Technologist: Maudry Mayhew MHA, RDMS, RVT, RDCS  Examination Guidelines: A complete evaluation includes B-mode imaging, spectral Doppler, color Doppler, and power Doppler as needed of all accessible portions of each vessel.  Bilateral testing is considered an integral part of a complete examination. Limited examinations for reoccurring indications may be performed as noted.  Right Venous Findings: +---------+---------------+---------+-----------+----------+-------------------+          CompressibilityPhasicitySpontaneityPropertiesSummary             +---------+---------------+---------+-----------+----------+-------------------+ CFV                                                   Unable to visualize +---------+---------------+---------+-----------+----------+-------------------+ SFJ                                                   Unable to visualize +---------+---------------+---------+-----------+----------+-------------------+ FV Prox  Full                                                             +---------+---------------+---------+-----------+----------+-------------------+ FV Mid   Full                                                             +---------+---------------+---------+-----------+----------+-------------------+  FV DistalFull                                                             +---------+---------------+---------+-----------+----------+-------------------+ PFV                                                   Unable to                                                                 adequately                                                                visualize           +---------+---------------+---------+-----------+----------+-------------------+ POP                     Yes      Yes                                      +---------+---------------+---------+-----------+----------+-------------------+ PTV      Full                                                             +---------+---------------+---------+-----------+----------+-------------------+ PERO     Full                                                              +---------+---------------+---------+-----------+----------+-------------------+  Left Venous Findings: +---------+---------------+---------+-----------+----------+-------------------+          CompressibilityPhasicitySpontaneityPropertiesSummary             +---------+---------------+---------+-----------+----------+-------------------+ CFV                                                   Unable to visualize +---------+---------------+---------+-----------+----------+-------------------+ SFJ                                                   Unable to visualize +---------+---------------+---------+-----------+----------+-------------------+ FV Prox  Full                                                             +---------+---------------+---------+-----------+----------+-------------------+  FV Mid   Full                                                             +---------+---------------+---------+-----------+----------+-------------------+ FV DistalFull                                                             +---------+---------------+---------+-----------+----------+-------------------+ PFV      Full                                                             +---------+---------------+---------+-----------+----------+-------------------+ POP      Full           Yes      Yes                                      +---------+---------------+---------+-----------+----------+-------------------+ PTV      Full                                                             +---------+---------------+---------+-----------+----------+-------------------+ PERO     Full                                                             +---------+---------------+---------+-----------+----------+-------------------+    Summary: Right: There is no evidence of deep vein thrombosis in the lower extremity. However, portions of this examination were  limited- see technologist comments above. No cystic structure found in the popliteal fossa. Left: There is no evidence of deep vein thrombosis in the lower extremity. However, portions of this examination were limited- see technologist comments above. No cystic structure found in the popliteal fossa.  *See table(s) above for measurements and observations.    Preliminary     Labs:  CBC: Recent Labs    06/08/18 1514 06/08/18 1526 06/09/18 0454 06/09/18 1459 06/10/18 0320  WBC 8.3  --  9.1 13.6* 12.2*  HGB 12.7* 14.3 11.3* 9.5* 8.1*  HCT 39.9 42.0 34.3* 30.0* 25.3*  PLT 153  --  149* 148* 131*    COAGS: Recent Labs    06/08/18 1514  INR 1.06  APTT 29    BMP: Recent Labs    06/08/18 1514 06/08/18 1526 06/08/18 2156 06/09/18 0454 06/10/18 0320  NA 137 138 134* 138 135  K 3.4* 3.4* 3.9 3.8 4.0  CL 103 107 101 105 101  CO2 20*  --  20* 22 20*  GLUCOSE 116* 114* 193*  76 96  BUN 63* 54* 58* 58* 71*  CALCIUM 10.5*  --  9.8 9.4 9.2  CREATININE 4.95* 5.10* 4.57* 4.84* 6.69*  GFRNONAA 11*  --  12* 11* 8*  GFRAA 13*  --  14* 13* 9*    LIVER FUNCTION TESTS: Recent Labs    05/08/18 1022 06/08/18 1514  BILITOT 0.8 0.9  AST 39 18  ALT 30 15  ALKPHOS 108 87  PROT 5.9* 6.7  ALBUMIN 3.5 4.0    Assessment and Plan:  Right ICA proximal occlusion s/p revascularization using stent assisted angioplasty 06/08/2018 by Dr. Estanislado Pandy.  Right MCA and right ACA proximal occlusions s/p emergent mechanical thrombectomy and stent placement achieving a TICI 2b revascularization 06/08/2018 by Dr. Estanislado Pandy.  Continue current care.  Electronically Signed: Murrell Redden, PA-C 06/10/2018, 10:45 AM    I spent a total of 15 Minutes at the the patient's bedside AND on the patient's hospital floor or unit, greater than 50% of which was counseling/coordinating care for f/u Right ICA Stent.

## 2018-06-10 NOTE — Evaluation (Signed)
Physical Therapy Evaluation Patient Details Name: Jason HUTTNER Sr. MRN: [id number] DOB: [birth year] Today's Date: 06/10/2018   History of Present Illness  72 year old male with rectal cancer status post chemotherapy and radiation, CKD stage V, OSA who presented with slurred speech and left-sided weakness and found with right MCA infarct secondary to right ICA and MCA occlusion status post stent and reperfusion.    Clinical Impression  Pt admitted with above diagnosis. Pt currently with functional limitations due to the deficits listed below (see PT Problem List). Pt was able to take a few pivotal steps to chair with +2 mod assist.  Pt is a great CIR candidate.  Will follow acutely.  Pt will benefit from skilled PT to increase their independence and safety with mobility to allow discharge to the venue listed below.      Follow Up Recommendations CIR;Supervision/Assistance - 24 hour    Equipment Recommendations  Other (comment)(TBA)    Recommendations for Other Services Rehab consult     Precautions / Restrictions Precautions Precautions: Fall Precaution Comments: L inattention Restrictions Weight Bearing Restrictions: No      Mobility  Bed Mobility Overal bed mobility: Needs Assistance Bed Mobility: Supine to Sit     Supine to sit: Mod assist        Transfers Overall transfer level: Needs assistance   Transfers: Sit to/from Stand;Stand Pivot Transfers Sit to Stand: +2 physical assistance;Mod assist Stand pivot transfers: Mod assist;+2 physical assistance       General transfer comment: Difficulty taking steps with LLE but did not appear to buckle.  Pivoted to 3N1 with mod assist due to posterior and left lateral lean.    Ambulation/Gait Ambulation/Gait assistance: Mod assist;+2 physical assistance Gait Distance (Feet): 2 Feet Assistive device: 2 person hand held assist Gait Pattern/deviations: Step-to pattern;Decreased step length - left;Decreased stride  length;Decreased weight shift to right;Decreased stance time - right;Ataxic;Leaning posteriorly;Staggering left     General Gait Details: Pt having difficulty coordinating steps with left LE.  Needed assist and cues.  Weakness on left LEas well.  Decr postural stabilty overal.   Stairs            Wheelchair Mobility    Modified Rankin (Stroke Patients Only) Modified Rankin (Stroke Patients Only) Pre-Morbid Rankin Score: No symptoms Modified Rankin: Moderately severe disability     Balance Overall balance assessment: Needs assistance Sitting-balance support: Bilateral upper extremity supported;Feet supported Sitting balance-Leahy Scale: Poor Sitting balance - Comments: initially posterior lean; difficulty maintaining midline postural control; gradually demosntrating increased control   Standing balance support: Bilateral upper extremity supported;During functional activity Standing balance-Leahy Scale: Poor Standing balance comment: reliant on external support                             Pertinent Vitals/Pain Pain Assessment: Faces Faces Pain Scale: No hurt    Home Living Family/patient expects to be discharged to:: Private residence Living Arrangements: Spouse/significant other Available Help at Discharge: Family;Available 24 hours/day Type of Home: House Home Access: Stairs to enter Entrance Stairs-Rails: None Entrance Stairs-Number of Steps: 1 Home Layout: One level Home Equipment: None      Prior Function Level of Independence: Independent               Hand Dominance   Dominant Hand: Right    Extremity/Trunk Assessment   Upper Extremity Assessment Upper Extremity Assessment: Defer to OT evaluation LUE Deficits / Details: Moving LUE voluntarily with difficulty;  AROM overall WFL; generalized weakness; poor in-hand manipulatiaons skills but attempting to use functionally; Brunstrom stage V (independencen from basic synergies) LUE  Coordination: decreased fine motor;decreased gross motor    Lower Extremity Assessment Lower Extremity Assessment: LLE deficits/detail LLE Deficits / Details: hip 3-/5, knee 4/5, ankle 3/5 LLE Sensation: decreased light touch;decreased proprioception LLE Coordination: decreased gross motor    Cervical / Trunk Assessment Cervical / Trunk Assessment: Other exceptions Cervical / Trunk Exceptions: L bias  Communication   Communication: Other (comment)(dysarthric)  Cognition Arousal/Alertness: Lethargic Behavior During Therapy: Flat affect Overall Cognitive Status: Impaired/Different from baseline Area of Impairment: Safety/judgement;Awareness;Problem solving;Attention                   Current Attention Level: Selective     Safety/Judgement: Decreased awareness of safety;Decreased awareness of deficits Awareness: Emergent Problem Solving: Slow processing        General Comments      Exercises     Assessment/Plan    PT Assessment Patient needs continued PT services  PT Problem List Decreased activity tolerance;Decreased balance;Decreased coordination;Decreased mobility;Decreased knowledge of use of DME;Decreased safety awareness;Decreased knowledge of precautions;Cardiopulmonary status limiting activity;Decreased strength       PT Treatment Interventions DME instruction;Gait training;Functional mobility training;Therapeutic activities;Therapeutic exercise;Balance training;Patient/family education;Neuromuscular re-education    PT Goals (Current goals can be found in the Care Plan section)  Acute Rehab PT Goals Patient Stated Goal: to get better PT Goal Formulation: With patient Time For Goal Achievement: 06/24/18 Potential to Achieve Goals: Good    Frequency Min 4X/week   Barriers to discharge        Co-evaluation PT/OT/SLP Co-Evaluation/Treatment: Yes Reason for Co-Treatment: For patient/therapist safety PT goals addressed during session: Mobility/safety  with mobility OT goals addressed during session: ADL's and self-care       AM-PAC PT "6 Clicks" Mobility  Outcome Measure Help needed turning from your back to your side while in a flat bed without using bedrails?: A Lot Help needed moving from lying on your back to sitting on the side of a flat bed without using bedrails?: A Lot Help needed moving to and from a bed to a chair (including a wheelchair)?: A Lot Help needed standing up from a chair using your arms (e.g., wheelchair or bedside chair)?: A Lot Help needed to walk in hospital room?: A Lot Help needed climbing 3-5 steps with a railing? : Total 6 Click Score: 11    End of Session Equipment Utilized During Treatment: Gait belt Activity Tolerance: Patient limited by fatigue Patient left: in chair;with call bell/phone within reach;with chair alarm set Nurse Communication: Mobility status PT Visit Diagnosis: Unsteadiness on feet (R26.81);Muscle weakness (generalized) (M62.81)    Time: 5093-2671 PT Time Calculation (min) (ACUTE ONLY): 25 min   Charges:   PT Evaluation $PT Eval Moderate Complexity: [place] Pager:  [phone]  Office:  [phone]    Denice Paradise 06/10/2018, 2:01 PM

## 2018-06-11 ENCOUNTER — Ambulatory Visit: Payer: Medicare Other | Admitting: Adult Health

## 2018-06-11 ENCOUNTER — Other Ambulatory Visit: Payer: Self-pay

## 2018-06-11 ENCOUNTER — Encounter (HOSPITAL_COMMUNITY): Payer: Self-pay | Admitting: General Practice

## 2018-06-11 DIAGNOSIS — I6521 Occlusion and stenosis of right carotid artery: Secondary | ICD-10-CM

## 2018-06-11 DIAGNOSIS — N185 Chronic kidney disease, stage 5: Secondary | ICD-10-CM

## 2018-06-11 LAB — CBC
HCT: 21.7 % — ABNORMAL LOW (ref 39.0–52.0)
Hemoglobin: 7.1 g/dL — ABNORMAL LOW (ref 13.0–17.0)
MCH: 27.7 pg (ref 26.0–34.0)
MCHC: 32.7 g/dL (ref 30.0–36.0)
MCV: 84.8 fL (ref 80.0–100.0)
Platelets: 119 10*3/uL — ABNORMAL LOW (ref 150–400)
RBC: 2.56 MIL/uL — ABNORMAL LOW (ref 4.22–5.81)
RDW: 19 % — ABNORMAL HIGH (ref 11.5–15.5)
WBC: 10.8 10*3/uL — ABNORMAL HIGH (ref 4.0–10.5)
nRBC: 0 % (ref 0.0–0.2)

## 2018-06-11 LAB — RENAL FUNCTION PANEL
Albumin: 2.6 g/dL — ABNORMAL LOW (ref 3.5–5.0)
Anion gap: 16 — ABNORMAL HIGH (ref 5–15)
BUN: 95 mg/dL — ABNORMAL HIGH (ref 8–23)
CALCIUM: 9 mg/dL (ref 8.9–10.3)
CO2: 18 mmol/L — ABNORMAL LOW (ref 22–32)
Chloride: 97 mmol/L — ABNORMAL LOW (ref 98–111)
Creatinine, Ser: 9.83 mg/dL — ABNORMAL HIGH (ref 0.61–1.24)
GFR calc Af Amer: 6 mL/min — ABNORMAL LOW (ref >60)
GFR calc non Af Amer: 5 mL/min — ABNORMAL LOW (ref >60)
Glucose, Bld: 105 mg/dL — ABNORMAL HIGH (ref 70–99)
Phosphorus: 5.1 mg/dL — ABNORMAL HIGH (ref 2.5–4.6)
Potassium: 4.6 mmol/L (ref 3.5–5.1)
Sodium: 131 mmol/L — ABNORMAL LOW (ref 135–145)

## 2018-06-11 LAB — ECHOCARDIOGRAM LIMITED
HEIGHTINCHES: 68 in
Weight: 2959.46 oz

## 2018-06-11 LAB — BASIC METABOLIC PANEL
Anion gap: 15 (ref 5–15)
BUN: 87 mg/dL — ABNORMAL HIGH (ref 8–23)
CO2: 18 mmol/L — ABNORMAL LOW (ref 22–32)
Calcium: 9 mg/dL (ref 8.9–10.3)
Chloride: 100 mmol/L (ref 98–111)
Creatinine, Ser: 9.23 mg/dL — ABNORMAL HIGH (ref 0.61–1.24)
GFR calc Af Amer: 6 mL/min — ABNORMAL LOW (ref >60)
GFR calc non Af Amer: 5 mL/min — ABNORMAL LOW (ref >60)
Glucose, Bld: 83 mg/dL (ref 70–99)
Potassium: 4.5 mmol/L (ref 3.5–5.1)
Sodium: 133 mmol/L — ABNORMAL LOW (ref 135–145)

## 2018-06-11 LAB — HEMOGLOBIN AND HEMATOCRIT, BLOOD
HCT: 21.2 % — ABNORMAL LOW (ref 39.0–52.0)
Hemoglobin: 7 g/dL — ABNORMAL LOW (ref 13.0–17.0)

## 2018-06-11 MED ORDER — PENTAFLUOROPROP-TETRAFLUOROETH EX AERO: 1.0000 "application " | INHALATION_SPRAY | CUTANEOUS | Status: DC | PRN

## 2018-06-11 MED ORDER — SODIUM CHLORIDE 0.9 % IV SOLN: 100.0000 mL | INTRAVENOUS | Status: DC | PRN

## 2018-06-11 MED ORDER — ALTEPLASE 2 MG IJ SOLR
2.0000 mg | Freq: Once | INTRAMUSCULAR | Status: DC | PRN
  Filled 2018-06-11: qty 2

## 2018-06-11 MED ORDER — LIDOCAINE HCL (PF) 1 % IJ SOLN: 5.0000 mL | INTRAMUSCULAR | Status: DC | PRN

## 2018-06-11 MED ORDER — LIDOCAINE-PRILOCAINE 2.5-2.5 % EX CREA
1.0000 "application " | TOPICAL_CREAM | CUTANEOUS | Status: DC | PRN
  Filled 2018-06-11: qty 5

## 2018-06-11 MED ORDER — CHLORHEXIDINE GLUCONATE CLOTH 2 % EX PADS
6.0000 | MEDICATED_PAD | Freq: Every day | CUTANEOUS | Status: DC
  Administered 2018-06-13 – 2018-06-15 (×3): 6 via TOPICAL

## 2018-06-11 MED ORDER — HEPARIN SODIUM (PORCINE) 1000 UNIT/ML DIALYSIS
1000.0000 [IU] | INTRAMUSCULAR | Status: DC | PRN
  Filled 2018-06-11: qty 1

## 2018-06-11 NOTE — Progress Notes (Signed)
Pt awake and talking,  Answers questions correctly.  No signs of pain or discomfort.  Able to move all extremities with ease.  Will continue to monitor the pt throughout the shift

## 2018-06-11 NOTE — Progress Notes (Signed)
Inpatient Rehabilitation Admissions Coordinator  I met with patient and his wife at bedside. We discussed goals and expectations of an intpt rehab admit. Wife prefers inpt rehab. I will begin insurance authorization with [facility] [employer] for a possible admit Monday pending insurance approval.  Danne Baxter, RN, MSN Rehab Admissions Coordinator [phone] 06/11/2018 12:49 PM

## 2018-06-11 NOTE — Progress Notes (Signed)
Referring Physician(s): CODE STROKE- Amie Portland  Supervising Physician: Luanne Bras  Patient Status:  [facility] - In-pt  Chief Complaint: None  Subjective:  Right ICA proximal occlusion s/p revascularization using stent assisted angioplasty 06/08/2018 by Dr. Estanislado Pandy. Right MCA and right ACA proximal occlusions s/p emergent mechanical thrombectomy and stent placement achieving a TICI 2b revascularization 06/08/2018 by Dr. Estanislado Pandy. Patient awake and alert laying in bed. RN reports plan to discharge to inpatient rehab, discharge date pending. Demonstrates left facial droop. Can spontaneously move all extremities. Right groin incision c/d/i.   Allergies: Nsaids and Amlodipine  Medications: Prior to Admission medications   Medication Sig Start Date End Date Taking? Authorizing Provider  acetaminophen (TYLENOL) 325 MG tablet Take 650 mg by mouth every 6 (six) hours as needed.   Yes [provider]  allopurinol (ZYLOPRIM) 100 MG tablet Take 1 tablet (100 mg total) by mouth daily. 05/11/18  Yes Mikhail, Velta Addison, DO  calcium carbonate (OSCAL) 1500 (600 Ca) MG TABS tablet Take 600 mg of elemental calcium by mouth daily.   Yes [provider]  cholecalciferol (VITAMIN D) 1000 units tablet Take 5,000 Units by mouth daily.   Yes [provider]  doxazosin (CARDURA) 8 MG tablet Take 1 tablet (8 mg total) by mouth daily. 02/05/18  Yes [name], MD  furosemide (LASIX) 80 MG tablet Take 1 tablet (80 mg total) by mouth daily. 05/12/18  Yes Mikhail, Maryann, DO  gabapentin (NEURONTIN) 300 MG capsule Take 1 capsule (300 mg total) by mouth at bedtime. Take 300mg s morning and 600mg s at bedtime 05/10/18  Yes Mikhail, Maryann, DO  KLOR-CON M20 20 MEQ tablet Take 1 tablet (20 mEq total) by mouth daily. 12/11/17  Yes [name], MD  loperamide (IMODIUM) 2 MG capsule Take 4 mg by mouth as needed for diarrhea or loose stools.   Yes [provider]    metoprolol succinate (TOPROL-XL) 50 MG 24 hr tablet Take 50 mg by mouth daily. 04/15/18  Yes [provider]  polycarbophil (FIBERCON) 625 MG tablet Take 1,250 mg by mouth as needed.    Yes [provider]  gabapentin (NEURONTIN) 300 MG capsule Take 1 capsule (300 mg total) by mouth 3 (three) times daily. 06/09/18   Wallene Huh, DPM     Vital Signs: BP 125/64 (BP Location: Right Arm)   Pulse 92   Temp 98.7 F (37.1 C) (Oral)   Resp 20   Ht 5\' 8"  (1.727 m)   Wt 184 lb 15.5 oz (83.9 kg)   SpO2 96%   BMI 28.12 kg/m   Physical Exam  Constitutional: He appears well-developed and well-nourished. No distress.  Pulmonary/Chest: Effort normal. No respiratory distress.  Neurological:  Awake, alert, and oriented x3. Speech and comprehension intact. PERRL bilaterally. EOMs not assessed. Visual fields not assessed. Demonstrates left facial droop. Tongue midline. Can spontaneously move all extremities. No pronator drift. Fine motor and coordination intact and symmetric. Gait not assessed. Romberg not assessed. Heel to toe not assessed. Distal pulses palpable bilaterally by Doppler.  Skin: Skin is warm and dry.  Right groin incision soft without active bleeding or hematoma.  Psychiatric: He has a normal mood and affect. His behavior is normal. Judgment and thought content normal.  Nursing note and vitals reviewed.   Imaging: Ct Angio Head W Or Wo Contrast  Result Date: 06/08/2018 CLINICAL DATA:  72 year old male code stroke presentation with hyperdense right M1. History of rectal cancer with treated lung metastases. EXAM:  CT ANGIOGRAPHY HEAD AND NECK CT PERFUSION BRAIN TECHNIQUE: Multidetector CT imaging of the head and neck was performed using the standard protocol during bolus administration of intravenous contrast. Multiplanar CT image reconstructions and MIPs were obtained to evaluate the vascular anatomy. Carotid stenosis measurements (when applicable) are  obtained utilizing NASCET criteria, using the distal internal carotid diameter as the denominator. Multiphase CT imaging of the brain was performed following IV bolus contrast injection. Subsequent parametric perfusion maps were calculated using RAPID software. CONTRAST:  133mL ISOVUE-370 IOPAMIDOL (ISOVUE-370) INJECTION 76% COMPARISON:  Noncontrast head CT 1521 hours today. Chest CT 12/01/2017. FINDINGS: CT Brain Perfusion Findings: CBF (<30%) Volume: 13 milliliters Perfusion (Tmax>6.0s) volume: 132 milliliters (hypoperfusion index of 0.5) Mismatch Volume: 119 milliliters (mismatch ratio of 10.2 Infarction Location:Deep right MCA territory white matter CTA NECK Skeleton: Absent dentition. No acute osseous abnormality identified. Upper chest: Stable partially calcified posterior left upper lobe lung nodule measuring 2-3 centimeters. Peripheral right upper lobe nodularity and pleural thickening also appears stable. No superior mediastinal lymphadenopathy. Other neck: Left nasal airway in place.  Otherwise negative. Aortic arch: 3 vessel arch configuration with mild for age arch atherosclerosis. Right carotid system: Negative brachiocephalic and right CCA origin. The right CCA is patent, but at the right carotid bifurcation the right ICA is abruptly occluded in the mid bulb as seen on series 10, image 69. Mild to moderate superimposed right ICA origin plaque. No reconstituted enhancement in the neck. Left carotid system: Negative left CCA origin. Mildly tortuous left CCA. Soft and calcified plaque at the left carotid bifurcation, left ICA origin and bulb resulting in less than 50 % stenosis with respect to the distal vessel. Vertebral arteries: No proximal right subclavian artery stenosis despite mild plaque. There is low-density plaque or thrombus in the right vertebral artery origin and V1 segment resulting in near occlusion (series 9, image 142),. The distal right V1 segment then resumes enhancing but there is poor  enhancement throughout the right vertebral artery to the skull base. Mild plaque at the left subclavian artery origin without stenosis. More bulky soft plaque at the proximal left subclavian near the left vertebral artery origin as demonstrated on series 8, image 272. The left vertebral origin is patent, but there is evidence of some left V1 segment soft plaque and stenosis as seen on series 9, image 160. Elsewhere in the neck the and appears normal left vertebral. Artery is patent patent distal CTA HEAD Posterior circulation: Left vertebral artery with calcified plaque resulting in mild to moderate stenosis. The right V4 segment is occluded. The basilar artery is patent although irregular throughout much of its course. There is no high-grade basilar stenosis. The SCA and PCA origins are patent. There is mild irregularity and stenosis at the left PCA origin (series 12, image 24). Posterior communicating arteries are diminutive or absent. There is contralateral moderate to severe right PCA P1/P2 junction stenosis (series 11, image 21). However, bilateral distal PCA enhancement is symmetric and within normal limits. Anterior circulation: The right ICA siphon is occluded with superimposed calcified plaque. The right ICA terminus is occluded. There is no right MCA M1 or bifurcation enhancement. There is minimal distal right MCA branch enhancement. The left ICA siphon is patent with calcified plaque resulting in moderate to severe supraclinoid segment stenosis as seen on series 10, image 107. The left ICA terminus is patent. The left MCA and ACA origins are normal. The anterior communicating artery is diminutive but patent and appears to supply the right ACA  A2 and distal A1 segments. There is mild bilateral ACA irregularity. The left MCA M1 segment and left MCA bifurcation are patent without stenosis. Left MCA branches are mildly irregular. Venous sinuses: Not evaluated due to early contrast timing. Anatomic variants:  None. Review of the MIP images confirms the above findings IMPRESSION: 1. Positive for LVO (occluded Right ICA, Right ICA terminus and Right MCA) with CT Perfusion criteria favorable for endovascular reperfusion (core infarct estimated at 13 mL with mismatch volume 119 mL, mismatch ratio of 10.2). Preliminary report of this was discussed by telephone with Dr. Amie Portland on 06/08/2018 at 15:46. 2. Positive also for occluded Right Vertebral Artery and soft plaque or thrombus at the Left Vertebral Artery origin. In conjunction with #1 this might reflect sequelae of cardiac or paradoxical emboli. This was discussed with Dr. Juliet Rude in [place] at 1359 hours. 3. Superimposed intracranial atherosclerosis including moderate or severe stenosis of the: - Left ICA supraclinoid segment. - Left vertebral artery V4 segment. - Right PCA P1/P2. 4. Stable visible upper chest since the CT on 12/01/2017. Electronically Signed   By: Genevie Ann M.D.   On: 06/08/2018 16:05   Dg Chest 1 View  Result Date: 06/08/2018 CLINICAL DATA:  OG tube placement. EXAM: CHEST  1 VIEW COMPARISON:  05/08/2018 and prior radiographs FINDINGS: An endotracheal tube is identified with tip is 4 cm above the carina, and OG tube entering the stomach with tip off the field of view. UPPER limits normal heart size with pulmonary vascular congestion noted. Elevated RIGHT hemidiaphragm again identified. No pneumothorax or definite pleural effusion. IMPRESSION: UPPER limits normal heart size and pulmonary vascular congestion. Support apparatus as described. Electronically Signed   By: Margarette Canada M.D.   On: 06/08/2018 21:18   Ct Angio Neck W Or Wo Contrast  Result Date: 06/08/2018 CLINICAL DATA:  72 year old male code stroke presentation with hyperdense right M1. History of rectal cancer with treated lung metastases. EXAM: CT ANGIOGRAPHY HEAD AND NECK CT PERFUSION BRAIN TECHNIQUE: Multidetector CT imaging of the head and neck was performed using the standard  protocol during bolus administration of intravenous contrast. Multiplanar CT image reconstructions and MIPs were obtained to evaluate the vascular anatomy. Carotid stenosis measurements (when applicable) are obtained utilizing NASCET criteria, using the distal internal carotid diameter as the denominator. Multiphase CT imaging of the brain was performed following IV bolus contrast injection. Subsequent parametric perfusion maps were calculated using RAPID software. CONTRAST:  181mL ISOVUE-370 IOPAMIDOL (ISOVUE-370) INJECTION 76% COMPARISON:  Noncontrast head CT 1521 hours today. Chest CT 12/01/2017. FINDINGS: CT Brain Perfusion Findings: CBF (<30%) Volume: 13 milliliters Perfusion (Tmax>6.0s) volume: 132 milliliters (hypoperfusion index of 0.5) Mismatch Volume: 119 milliliters (mismatch ratio of 10.2 Infarction Location:Deep right MCA territory white matter CTA NECK Skeleton: Absent dentition. No acute osseous abnormality identified. Upper chest: Stable partially calcified posterior left upper lobe lung nodule measuring 2-3 centimeters. Peripheral right upper lobe nodularity and pleural thickening also appears stable. No superior mediastinal lymphadenopathy. Other neck: Left nasal airway in place.  Otherwise negative. Aortic arch: 3 vessel arch configuration with mild for age arch atherosclerosis. Right carotid system: Negative brachiocephalic and right CCA origin. The right CCA is patent, but at the right carotid bifurcation the right ICA is abruptly occluded in the mid bulb as seen on series 10, image 69. Mild to moderate superimposed right ICA origin plaque. No reconstituted enhancement in the neck. Left carotid system: Negative left CCA origin. Mildly tortuous left CCA. Soft and calcified plaque  at the left carotid bifurcation, left ICA origin and bulb resulting in less than 50 % stenosis with respect to the distal vessel. Vertebral arteries: No proximal right subclavian artery stenosis despite mild plaque.  There is low-density plaque or thrombus in the right vertebral artery origin and V1 segment resulting in near occlusion (series 9, image 142),. The distal right V1 segment then resumes enhancing but there is poor enhancement throughout the right vertebral artery to the skull base. Mild plaque at the left subclavian artery origin without stenosis. More bulky soft plaque at the proximal left subclavian near the left vertebral artery origin as demonstrated on series 8, image 272. The left vertebral origin is patent, but there is evidence of some left V1 segment soft plaque and stenosis as seen on series 9, image 160. Elsewhere in the neck the and appears normal left vertebral. Artery is patent patent distal CTA HEAD Posterior circulation: Left vertebral artery with calcified plaque resulting in mild to moderate stenosis. The right V4 segment is occluded. The basilar artery is patent although irregular throughout much of its course. There is no high-grade basilar stenosis. The SCA and PCA origins are patent. There is mild irregularity and stenosis at the left PCA origin (series 12, image 24). Posterior communicating arteries are diminutive or absent. There is contralateral moderate to severe right PCA P1/P2 junction stenosis (series 11, image 21). However, bilateral distal PCA enhancement is symmetric and within normal limits. Anterior circulation: The right ICA siphon is occluded with superimposed calcified plaque. The right ICA terminus is occluded. There is no right MCA M1 or bifurcation enhancement. There is minimal distal right MCA branch enhancement. The left ICA siphon is patent with calcified plaque resulting in moderate to severe supraclinoid segment stenosis as seen on series 10, image 107. The left ICA terminus is patent. The left MCA and ACA origins are normal. The anterior communicating artery is diminutive but patent and appears to supply the right ACA A2 and distal A1 segments. There is mild bilateral ACA  irregularity. The left MCA M1 segment and left MCA bifurcation are patent without stenosis. Left MCA branches are mildly irregular. Venous sinuses: Not evaluated due to early contrast timing. Anatomic variants: None. Review of the MIP images confirms the above findings IMPRESSION: 1. Positive for LVO (occluded Right ICA, Right ICA terminus and Right MCA) with CT Perfusion criteria favorable for endovascular reperfusion (core infarct estimated at 13 mL with mismatch volume 119 mL, mismatch ratio of 10.2). Preliminary report of this was discussed by telephone with Dr. Amie Portland on 06/08/2018 at 15:46. 2. Positive also for occluded Right Vertebral Artery and soft plaque or thrombus at the Left Vertebral Artery origin. In conjunction with #1 this might reflect sequelae of cardiac or paradoxical emboli. This was discussed with Dr. Juliet Rude in [place] at 1359 hours. 3. Superimposed intracranial atherosclerosis including moderate or severe stenosis of the: - Left ICA supraclinoid segment. - Left vertebral artery V4 segment. - Right PCA P1/P2. 4. Stable visible upper chest since the CT on 12/01/2017. Electronically Signed   By: Genevie Ann M.D.   On: 06/08/2018 16:05   Mr Jodene Nam Head Wo Contrast  Result Date: 06/09/2018 CLINICAL DATA:  Left hemiparesis and dysarthria. Status post endovascular revascularization of occluded right ICA, right MCA, and proximal right ACA. EXAM: MRI HEAD WITHOUT CONTRAST MRA HEAD WITHOUT CONTRAST TECHNIQUE: Multiplanar, multiecho pulse sequences of the brain and surrounding structures were obtained without intravenous contrast. Angiographic images of the head were obtained using  MRA technique without contrast. COMPARISON:  Head CT, CTA, and cerebral perfusion 06/08/2018 FINDINGS: MRI HEAD FINDINGS Brain: There are patchy acute infarcts involving gray and white matter scattered throughout the right MCA territory including extensive involvement of the right basal ganglia. The right insula is also  diffusely involved, and there are additional small acute infarcts in the right temporal, right frontal, and parietal lobes. No associated hemorrhage is seen. There is no intracranial mass effect or extra-axial fluid collection. There is mild cerebral atrophy, and scattered T2 hyperintensities in the cerebral white matter bilaterally are nonspecific but compatible with mild chronic small vessel ischemic disease. A chronic lacunar infarct is noted in the left lentiform nucleus. Vascular: Abnormal appearance of the distal right vertebral artery. Susceptibility from right MCA stent. Skull and upper cervical spine: Mildly heterogeneous bone marrow signal diffusely without destructive lesion. Sinuses/Orbits: Rightward gaze. Mild scattered mucosal thickening in the paranasal sinuses. Right larger than left mastoid effusions. Other: None. MRA HEAD FINDINGS The visualized distal left vertebral artery is patent and supplies the basilar with mild stenosis distally. The right V4 segment is occluded as seen on yesterday's CTA. The basilar artery is patent with mild diffuse irregularity and up to mild stenosis in its proximal and mid portions. SCAs are patent. There is a patent left posterior communicating artery. PCAs are patent with mild left P1 and severe proximal right P2 stenoses again noted. Lack of signal in the included mid cervical ICA on the right likely reflects artifact from the recently placed stent given normal flow related enhancement more distally. The intracranial internal carotid arteries are patent with mild right and moderate to severe left supraclinoid stenoses. A 2 mm aneurysm projects inferolaterally from the horizontal cavernous ICA on the right. The ACAs are patent with moderate bilateral origin stenoses. The left MCA is patent with branch vessel irregularity but no significant proximal stenosis. The right MCA remains widely patent proximal to the stent. There is variable signal loss involving the more  distal right M1 segment related to the stent which limits assessment for stenosis. Right M2 trunks appear patent proximally, however there is attenuation of more distal right MCA branch vessels with multiple missing branches involving the superior greater than inferior divisions. IMPRESSION: 1. Patchy acute right MCA infarcts with greatest involvement of the basal ganglia. 2. Mild chronic small vessel ischemic disease. 3. Continued patency of the intracranial right ICA, right ACA, and proximal right MCA following revascularization. Artifact from right MCA stent with decreased number of distal MCA branch vessels. 4. Mild right and moderate to severe left supraclinoid ICA stenoses. 5. Moderate bilateral ACA origin stenoses. 6. Occluded distal right vertebral artery. 7. Severe proximal right P2 stenosis. 8. 2 mm right cavernous ICA aneurysm. Electronically Signed   By: Logan Bores M.D.   On: 06/09/2018 14:09   Mr Brain Wo Contrast  Result Date: 06/09/2018 CLINICAL DATA:  Left hemiparesis and dysarthria. Status post endovascular revascularization of occluded right ICA, right MCA, and proximal right ACA. EXAM: MRI HEAD WITHOUT CONTRAST MRA HEAD WITHOUT CONTRAST TECHNIQUE: Multiplanar, multiecho pulse sequences of the brain and surrounding structures were obtained without intravenous contrast. Angiographic images of the head were obtained using MRA technique without contrast. COMPARISON:  Head CT, CTA, and cerebral perfusion 06/08/2018 FINDINGS: MRI HEAD FINDINGS Brain: There are patchy acute infarcts involving gray and white matter scattered throughout the right MCA territory including extensive involvement of the right basal ganglia. The right insula is also diffusely involved, and there are additional small  acute infarcts in the right temporal, right frontal, and parietal lobes. No associated hemorrhage is seen. There is no intracranial mass effect or extra-axial fluid collection. There is mild cerebral atrophy,  and scattered T2 hyperintensities in the cerebral white matter bilaterally are nonspecific but compatible with mild chronic small vessel ischemic disease. A chronic lacunar infarct is noted in the left lentiform nucleus. Vascular: Abnormal appearance of the distal right vertebral artery. Susceptibility from right MCA stent. Skull and upper cervical spine: Mildly heterogeneous bone marrow signal diffusely without destructive lesion. Sinuses/Orbits: Rightward gaze. Mild scattered mucosal thickening in the paranasal sinuses. Right larger than left mastoid effusions. Other: None. MRA HEAD FINDINGS The visualized distal left vertebral artery is patent and supplies the basilar with mild stenosis distally. The right V4 segment is occluded as seen on yesterday's CTA. The basilar artery is patent with mild diffuse irregularity and up to mild stenosis in its proximal and mid portions. SCAs are patent. There is a patent left posterior communicating artery. PCAs are patent with mild left P1 and severe proximal right P2 stenoses again noted. Lack of signal in the included mid cervical ICA on the right likely reflects artifact from the recently placed stent given normal flow related enhancement more distally. The intracranial internal carotid arteries are patent with mild right and moderate to severe left supraclinoid stenoses. A 2 mm aneurysm projects inferolaterally from the horizontal cavernous ICA on the right. The ACAs are patent with moderate bilateral origin stenoses. The left MCA is patent with branch vessel irregularity but no significant proximal stenosis. The right MCA remains widely patent proximal to the stent. There is variable signal loss involving the more distal right M1 segment related to the stent which limits assessment for stenosis. Right M2 trunks appear patent proximally, however there is attenuation of more distal right MCA branch vessels with multiple missing branches involving the superior greater than  inferior divisions. IMPRESSION: 1. Patchy acute right MCA infarcts with greatest involvement of the basal ganglia. 2. Mild chronic small vessel ischemic disease. 3. Continued patency of the intracranial right ICA, right ACA, and proximal right MCA following revascularization. Artifact from right MCA stent with decreased number of distal MCA branch vessels. 4. Mild right and moderate to severe left supraclinoid ICA stenoses. 5. Moderate bilateral ACA origin stenoses. 6. Occluded distal right vertebral artery. 7. Severe proximal right P2 stenosis. 8. 2 mm right cavernous ICA aneurysm. Electronically Signed   By: Logan Bores M.D.   On: 06/09/2018 14:09   Ct Cerebral Perfusion W Contrast  Result Date: 06/08/2018 CLINICAL DATA:  72 year old male code stroke presentation with hyperdense right M1. History of rectal cancer with treated lung metastases. EXAM: CT ANGIOGRAPHY HEAD AND NECK CT PERFUSION BRAIN TECHNIQUE: Multidetector CT imaging of the head and neck was performed using the standard protocol during bolus administration of intravenous contrast. Multiplanar CT image reconstructions and MIPs were obtained to evaluate the vascular anatomy. Carotid stenosis measurements (when applicable) are obtained utilizing NASCET criteria, using the distal internal carotid diameter as the denominator. Multiphase CT imaging of the brain was performed following IV bolus contrast injection. Subsequent parametric perfusion maps were calculated using RAPID software. CONTRAST:  18mL ISOVUE-370 IOPAMIDOL (ISOVUE-370) INJECTION 76% COMPARISON:  Noncontrast head CT 1521 hours today. Chest CT 12/01/2017. FINDINGS: CT Brain Perfusion Findings: CBF (<30%) Volume: 13 milliliters Perfusion (Tmax>6.0s) volume: 132 milliliters (hypoperfusion index of 0.5) Mismatch Volume: 119 milliliters (mismatch ratio of 10.2 Infarction Location:Deep right MCA territory white matter CTA NECK Skeleton: Absent  dentition. No acute osseous abnormality  identified. Upper chest: Stable partially calcified posterior left upper lobe lung nodule measuring 2-3 centimeters. Peripheral right upper lobe nodularity and pleural thickening also appears stable. No superior mediastinal lymphadenopathy. Other neck: Left nasal airway in place.  Otherwise negative. Aortic arch: 3 vessel arch configuration with mild for age arch atherosclerosis. Right carotid system: Negative brachiocephalic and right CCA origin. The right CCA is patent, but at the right carotid bifurcation the right ICA is abruptly occluded in the mid bulb as seen on series 10, image 69. Mild to moderate superimposed right ICA origin plaque. No reconstituted enhancement in the neck. Left carotid system: Negative left CCA origin. Mildly tortuous left CCA. Soft and calcified plaque at the left carotid bifurcation, left ICA origin and bulb resulting in less than 50 % stenosis with respect to the distal vessel. Vertebral arteries: No proximal right subclavian artery stenosis despite mild plaque. There is low-density plaque or thrombus in the right vertebral artery origin and V1 segment resulting in near occlusion (series 9, image 142),. The distal right V1 segment then resumes enhancing but there is poor enhancement throughout the right vertebral artery to the skull base. Mild plaque at the left subclavian artery origin without stenosis. More bulky soft plaque at the proximal left subclavian near the left vertebral artery origin as demonstrated on series 8, image 272. The left vertebral origin is patent, but there is evidence of some left V1 segment soft plaque and stenosis as seen on series 9, image 160. Elsewhere in the neck the and appears normal left vertebral. Artery is patent patent distal CTA HEAD Posterior circulation: Left vertebral artery with calcified plaque resulting in mild to moderate stenosis. The right V4 segment is occluded. The basilar artery is patent although irregular throughout much of its  course. There is no high-grade basilar stenosis. The SCA and PCA origins are patent. There is mild irregularity and stenosis at the left PCA origin (series 12, image 24). Posterior communicating arteries are diminutive or absent. There is contralateral moderate to severe right PCA P1/P2 junction stenosis (series 11, image 21). However, bilateral distal PCA enhancement is symmetric and within normal limits. Anterior circulation: The right ICA siphon is occluded with superimposed calcified plaque. The right ICA terminus is occluded. There is no right MCA M1 or bifurcation enhancement. There is minimal distal right MCA branch enhancement. The left ICA siphon is patent with calcified plaque resulting in moderate to severe supraclinoid segment stenosis as seen on series 10, image 107. The left ICA terminus is patent. The left MCA and ACA origins are normal. The anterior communicating artery is diminutive but patent and appears to supply the right ACA A2 and distal A1 segments. There is mild bilateral ACA irregularity. The left MCA M1 segment and left MCA bifurcation are patent without stenosis. Left MCA branches are mildly irregular. Venous sinuses: Not evaluated due to early contrast timing. Anatomic variants: None. Review of the MIP images confirms the above findings IMPRESSION: 1. Positive for LVO (occluded Right ICA, Right ICA terminus and Right MCA) with CT Perfusion criteria favorable for endovascular reperfusion (core infarct estimated at 13 mL with mismatch volume 119 mL, mismatch ratio of 10.2). Preliminary report of this was discussed by telephone with Dr. Amie Portland on 06/08/2018 at 15:46. 2. Positive also for occluded Right Vertebral Artery and soft plaque or thrombus at the Left Vertebral Artery origin. In conjunction with #1 this might reflect sequelae of cardiac or paradoxical emboli. This was discussed with  Dr. Nicole Kindred DEVESHWAR in [place] at 1359 hours. 3. Superimposed intracranial atherosclerosis including  moderate or severe stenosis of the: - Left ICA supraclinoid segment. - Left vertebral artery V4 segment. - Right PCA P1/P2. 4. Stable visible upper chest since the CT on 12/01/2017. Electronically Signed   By: Genevie Ann M.D.   On: 06/08/2018 16:05   Dg Chest Port 1 View  Result Date: 06/10/2018 CLINICAL DATA:  Congestive heart failure EXAM: PORTABLE CHEST 1 VIEW COMPARISON:  Chest x-ray of 06/08/2017 FINDINGS: The endotracheal tube and NG tube have been removed. Aeration of the lungs has improved. No definite pulmonary vascular congestion or CHF is seen and no effusion is noted. Mild cardiomegaly is stable. IMPRESSION: Endotracheal tube removed.  Resolution of CHF. Electronically Signed   By: [name] M.D.   On: 06/10/2018 15:02   Dg Abd Portable 1v  Result Date: 06/08/2018 CLINICAL DATA:  OG tube placement. EXAM: PORTABLE ABDOMEN - 1 VIEW COMPARISON:  None. FINDINGS: An OG tube is identified with tip overlying the distal stomach/proximal duodenum. Bowel gas pattern is unremarkable. IMPRESSION: OG tube with tip overlying the distal stomach/proximal duodenum. Electronically Signed   By: Margarette Canada M.D.   On: 06/08/2018 21:19   Ct Head Code Stroke Wo Contrast  Result Date: 06/08/2018 CLINICAL DATA:  Code stroke.  72 year old male. Rightward gaze. EXAM: CT HEAD WITHOUT CONTRAST TECHNIQUE: Contiguous axial images were obtained from the base of the skull through the vertex without intravenous contrast. COMPARISON:  None. FINDINGS: Brain: Asymmetric loss of gray-white matter differentiation at the right insula on series 3, image 17. No other cytotoxic edema identified in the right MCA territory. No midline shift, ventriculomegaly, mass effect, evidence of mass lesion, intracranial hemorrhage. Patchy bilateral white matter hypodensity. Vascular: Calcified atherosclerosis at the skull base. Hyperdense right MCA M1 segment as seen on series 5, image 26. Skull: No acute osseous abnormality identified.  Sinuses/Orbits: left nasal airway. Paranasal sinuses are well pneumatized, mild right maxillary mucosal thickening. Mild right mastoid effusion. Tympanic cavities remain clear. Other: Mild rightward gaze deviation. No other acute orbit or scalp soft tissue findings. ASPECTS [facility] Stroke Program Early CT Score) - Ganglionic level infarction (caudate, lentiform nuclei, internal capsule, insula, M1-M3 cortex): 6 - Supraganglionic infarction (M4-M6 cortex): Total score (0-10 with 10 being normal): IMPRESSION: 1. Hyperdense Right MCA M1 segment compatible with ELVO. Subtle cytotoxic edema suspected in the right insula, ASPECTS is 9. 2. Negative for bleed. 3. These results were communicated to Dr. Rory Percy at 3:33 pmon 12/3/2019by text page via the [facility] messaging system. Electronically Signed   By: Genevie Ann M.D.   On: 06/08/2018 15:34   Vas [place] Lower Extremity Venous (dvt)  Result Date: 06/10/2018  Lower Venous Study Indications: Stroke.  Limitations: Bedrest. Performing Technologist: Maudry Mayhew MHA, RDMS, RVT, RDCS  Examination Guidelines: A complete evaluation includes B-mode imaging, spectral Doppler, color Doppler, and power Doppler as needed of all accessible portions of each vessel. Bilateral testing is considered an integral part of a complete examination. Limited examinations for reoccurring indications may be performed as noted.  Right Venous Findings: +---------+---------------+---------+-----------+----------+-------------------+          CompressibilityPhasicitySpontaneityPropertiesSummary             +---------+---------------+---------+-----------+----------+-------------------+ CFV  Unable to visualize +---------+---------------+---------+-----------+----------+-------------------+ SFJ                                                   Unable to visualize  +---------+---------------+---------+-----------+----------+-------------------+ FV Prox  Full                                                             +---------+---------------+---------+-----------+----------+-------------------+ FV Mid   Full                                                             +---------+---------------+---------+-----------+----------+-------------------+ FV DistalFull                                                             +---------+---------------+---------+-----------+----------+-------------------+ PFV                                                   Unable to                                                                 adequately                                                                visualize           +---------+---------------+---------+-----------+----------+-------------------+ POP                     Yes      Yes                                      +---------+---------------+---------+-----------+----------+-------------------+ PTV      Full                                                             +---------+---------------+---------+-----------+----------+-------------------+ PERO     Full                                                             +---------+---------------+---------+-----------+----------+-------------------+  Left Venous Findings: +---------+---------------+---------+-----------+----------+-------------------+          CompressibilityPhasicitySpontaneityPropertiesSummary             +---------+---------------+---------+-----------+----------+-------------------+ CFV                                                   Unable to visualize +---------+---------------+---------+-----------+----------+-------------------+ SFJ                                                   Unable to visualize  +---------+---------------+---------+-----------+----------+-------------------+ FV Prox  Full                                                             +---------+---------------+---------+-----------+----------+-------------------+ FV Mid   Full                                                             +---------+---------------+---------+-----------+----------+-------------------+ FV DistalFull                                                             +---------+---------------+---------+-----------+----------+-------------------+ PFV      Full                                                             +---------+---------------+---------+-----------+----------+-------------------+ POP      Full           Yes      Yes                                      +---------+---------------+---------+-----------+----------+-------------------+ PTV      Full                                                             +---------+---------------+---------+-----------+----------+-------------------+ PERO     Full                                                             +---------+---------------+---------+-----------+----------+-------------------+  Summary: Right: There is no evidence of deep vein thrombosis in the lower extremity. However, portions of this examination were limited- see technologist comments above. No cystic structure found in the popliteal fossa. Left: There is no evidence of deep vein thrombosis in the lower extremity. However, portions of this examination were limited- see technologist comments above. No cystic structure found in the popliteal fossa.  *See table(s) above for measurements and observations. Electronically signed by Monica Martinez MD on 06/10/2018 at 3:48:22 PM.    Final     Labs:  CBC: Recent Labs    06/09/18 0454 06/09/18 1459 06/10/18 0320 06/11/18 0439  WBC 9.1 13.6* 12.2* 10.8*  HGB 11.3* 9.5* 8.1* 7.1*  HCT  34.3* 30.0* 25.3* 21.7*  PLT 149* 148* 131* 119*    COAGS: Recent Labs    06/08/18 1514  INR 1.06  APTT 29    BMP: Recent Labs    06/08/18 2156 06/09/18 0454 06/10/18 0320 06/11/18 0439  NA 134* 138 135 133*  K 3.9 3.8 4.0 4.5  CL 101 105 101 100  CO2 20* 22 20* 18*  GLUCOSE 193* 76 96 83  BUN 58* 58* 71* 87*  CALCIUM 9.8 9.4 9.2 9.0  CREATININE 4.57* 4.84* 6.69* 9.23*  GFRNONAA 12* 11* 8* 5*  GFRAA 14* 13* 9* 6*    LIVER FUNCTION TESTS: Recent Labs    05/08/18 1022 06/08/18 1514  BILITOT 0.8 0.9  AST 39 18  ALT 30 15  ALKPHOS 108 87  PROT 5.9* 6.7  ALBUMIN 3.5 4.0    Assessment and Plan:  Right ICA proximal occlusion s/p revascularization using stent assisted angioplasty 06/08/2018 by Dr. Estanislado Pandy. Right MCA and right ACA proximal occlusions s/p emergent mechanical thrombectomy and stent placement achieving a TICI 2b revascularization 06/08/2018 by Dr. Estanislado Pandy. Patient's condition improving- can move all extremities but demonstrates left facial droop. Right groin incision stable. Continue taking Brilinta 90 mg twice daily and Aspirin 81 mg once daily. Appreciate and agree with neurology and CCM management. Plan to follow-up with Dr. Estanislado Pandy in clinic 4 weeks after discharge from inpatient rehab. IR to follow.   Electronically Signed: Earley Abide, PA-C 06/11/2018, 10:04 AM   I spent a total of 15 Minutes at the the patient's bedside AND on the patient's hospital floor or unit, greater than 50% of which was counseling/coordinating care for right ICA proximal, right MCA, and right ACA proximal s/p revascularization.

## 2018-06-11 NOTE — Plan of Care (Signed)
  Problem: Education: Goal: Knowledge of disease or condition will improve Outcome: Progressing Goal: Knowledge of secondary prevention will improve Outcome: Progressing Goal: Knowledge of patient specific risk factors addressed and post discharge goals established will improve Outcome: Progressing Goal: Individualized Educational Video(s) Outcome: Progressing   Problem: Health Behavior/Discharge Planning: Goal: Ability to manage health-related needs will improve Outcome: Progressing   Problem: Self-Care: Goal: Ability to participate in self-care as condition permits will improve Outcome: Progressing Goal: Verbalization of feelings and concerns over difficulty with self-care will improve Outcome: Progressing Goal: Ability to communicate needs accurately will improve Outcome: Progressing   Problem: Nutrition: Goal: Risk of aspiration will decrease Outcome: Progressing Goal: Dietary intake will improve Outcome: Progressing   Problem: Ischemic Stroke/TIA Tissue Perfusion: Goal: Complications of ischemic stroke/TIA will be minimized Outcome: Progressing   Problem: Education: Goal: Knowledge of General Education information will improve Description Including pain rating scale, medication(s)/side effects and non-pharmacologic comfort measures Outcome: Progressing   Problem: Health Behavior/Discharge Planning: Goal: Ability to manage health-related needs will improve Outcome: Progressing   Problem: Clinical Measurements: Goal: Ability to maintain clinical measurements within normal limits will improve Outcome: Progressing Goal: Will remain free from infection Outcome: Progressing Goal: Diagnostic test results will improve Outcome: Progressing Goal: Respiratory complications will improve Outcome: Progressing Goal: Cardiovascular complication will be avoided Outcome: Progressing   Problem: Activity: Goal: Risk for activity intolerance will decrease Outcome:  Progressing   Problem: Nutrition: Goal: Adequate nutrition will be maintained Outcome: Progressing   Problem: Coping: Goal: Level of anxiety will decrease Outcome: Progressing   Problem: Elimination: Goal: Will not experience complications related to bowel motility Outcome: Progressing Goal: Will not experience complications related to urinary retention Outcome: Progressing   Problem: Pain Managment: Goal: General experience of comfort will improve Outcome: Progressing   Problem: Safety: Goal: Ability to remain free from injury will improve Outcome: Progressing   Problem: Skin Integrity: Goal: Risk for impaired skin integrity will decrease Outcome: Progressing

## 2018-06-11 NOTE — Care Management Important Message (Signed)
Important Message  Patient Details  Name: Jason BLYDEN Sr. MRN: [id number] Date of Birth: 01/02/1946   Medicare Important Message Given:  No Due to illness patient was not able to sign/  Unsigned copy left   Khalani Novoa 06/11/2018, 4:03 PM

## 2018-06-11 NOTE — Progress Notes (Signed)
STROKE TEAM PROGRESS NOTE   SUBJECTIVE (INTERVAL HISTORY) His wife is at the bedside. He was sitting in chair for lunch. More awake alert and eating well. A couple of cough on food but drinking water no problem at all. Still has left hemiparesis. Cre continues to trending up, but clinically improving. Pending CIR. Requested nephrology consultation.    OBJECTIVE Temp:  [98.2 F (36.8 C)-98.7 F (37.1 C)] 98.7 F (37.1 C) (12/06 0400) Pulse Rate:  [66-95] 95 (12/06 1209) Cardiac Rhythm: Normal sinus rhythm (12/06 0700) Resp:  [14-20] 18 (12/06 1209) BP: (103-125)/(45-66) 113/49 (12/06 1209) SpO2:  [96 %-100 %] 100 % (12/06 1209)  Recent Labs  Lab 06/08/18 1515 06/09/18 0812  GLUCAP 102* 103*   Recent Labs  Lab 06/08/18 1514 06/08/18 1526 06/08/18 2156 06/09/18 0454 06/10/18 0320 06/11/18 0439  NA 137 138 134* 138 135 133*  K 3.4* 3.4* 3.9 3.8 4.0 4.5  CL 103 107 101 105 101 100  CO2 20*  --  20* 22 20* 18*  GLUCOSE 116* 114* 193* 76 96 83  BUN 63* 54* 58* 58* 71* 87*  CREATININE 4.95* 5.10* 4.57* 4.84* 6.69* 9.23*  CALCIUM 10.5*  --  9.8 9.4 9.2 9.0   Recent Labs  Lab 06/08/18 1514  AST 18  ALT 15  ALKPHOS 87  BILITOT 0.9  PROT 6.7  ALBUMIN 4.0   Recent Labs  Lab 06/08/18 1514 06/08/18 1526 06/09/18 0454 06/09/18 1459 06/10/18 0320 06/11/18 0439  WBC 8.3  --  9.1 13.6* 12.2* 10.8*  NEUTROABS 6.8  --  7.6  --   --   --   HGB 12.7* 14.3 11.3* 9.5* 8.1* 7.1*  HCT 39.9 42.0 34.3* 30.0* 25.3* 21.7*  MCV 87.9  --  84.3 86.2 85.8 84.8  PLT 153  --  149* 148* 131* 119*   No results for input(s): CKTOTAL, CKMB, CKMBINDEX, TROPONINI in the last 168 hours. Recent Labs    06/08/18 1514  LABPROT 13.7  INR 1.06   Recent Labs    06/09/18 0418  COLORURINE YELLOW  LABSPEC 1.029  PHURINE 5.0  GLUCOSEU NEGATIVE  HGBUR SMALL*  BILIRUBINUR NEGATIVE  KETONESUR NEGATIVE  PROTEINUR 100*  NITRITE NEGATIVE  LEUKOCYTESUR NEGATIVE       Component Value  Date/Time   CHOL 171 06/09/2018 0454   TRIG 101 06/09/2018 0454   TRIG 104 05/11/2006 1014   HDL 48 06/09/2018 0454   CHOLHDL 3.6 06/09/2018 0454   VLDL 20 06/09/2018 0454   LDLCALC 103 (H) 06/09/2018 0454   Lab Results  Component Value Date   HGBA1C 5.5 06/09/2018      Component Value Date/Time   LABOPIA NONE DETECTED 06/09/2018 0418   COCAINSCRNUR NONE DETECTED 06/09/2018 0418   LABBENZ NONE DETECTED 06/09/2018 0418   AMPHETMU NONE DETECTED 06/09/2018 0418   THCU NONE DETECTED 06/09/2018 0418   LABBARB NONE DETECTED 06/09/2018 0418    Recent Labs  Lab 06/08/18 1514  ETH <10    I have personally reviewed the radiological images below and agree with the radiology interpretations.  Ct Angio Head W Or Wo Contrast  Result Date: 06/08/2018 CLINICAL DATA:  72 year old male code stroke presentation with hyperdense right M1. History of rectal cancer with treated lung metastases. EXAM: CT ANGIOGRAPHY HEAD AND NECK CT PERFUSION BRAIN TECHNIQUE: Multidetector CT imaging of the head and neck was performed using the standard protocol during bolus administration of intravenous contrast. Multiplanar CT image reconstructions and MIPs were obtained to evaluate  the vascular anatomy. Carotid stenosis measurements (when applicable) are obtained utilizing NASCET criteria, using the distal internal carotid diameter as the denominator. Multiphase CT imaging of the brain was performed following IV bolus contrast injection. Subsequent parametric perfusion maps were calculated using RAPID software. CONTRAST:  124mL ISOVUE-370 IOPAMIDOL (ISOVUE-370) INJECTION 76% COMPARISON:  Noncontrast head CT 1521 hours today. Chest CT 12/01/2017. FINDINGS: CT Brain Perfusion Findings: CBF (<30%) Volume: 13 milliliters Perfusion (Tmax>6.0s) volume: 132 milliliters (hypoperfusion index of 0.5) Mismatch Volume: 119 milliliters (mismatch ratio of 10.2 Infarction Location:Deep right MCA territory white matter CTA NECK Skeleton:  Absent dentition. No acute osseous abnormality identified. Upper chest: Stable partially calcified posterior left upper lobe lung nodule measuring 2-3 centimeters. Peripheral right upper lobe nodularity and pleural thickening also appears stable. No superior mediastinal lymphadenopathy. Other neck: Left nasal airway in place.  Otherwise negative. Aortic arch: 3 vessel arch configuration with mild for age arch atherosclerosis. Right carotid system: Negative brachiocephalic and right CCA origin. The right CCA is patent, but at the right carotid bifurcation the right ICA is abruptly occluded in the mid bulb as seen on series 10, image 69. Mild to moderate superimposed right ICA origin plaque. No reconstituted enhancement in the neck. Left carotid system: Negative left CCA origin. Mildly tortuous left CCA. Soft and calcified plaque at the left carotid bifurcation, left ICA origin and bulb resulting in less than 50 % stenosis with respect to the distal vessel. Vertebral arteries: No proximal right subclavian artery stenosis despite mild plaque. There is low-density plaque or thrombus in the right vertebral artery origin and V1 segment resulting in near occlusion (series 9, image 142),. The distal right V1 segment then resumes enhancing but there is poor enhancement throughout the right vertebral artery to the skull base. Mild plaque at the left subclavian artery origin without stenosis. More bulky soft plaque at the proximal left subclavian near the left vertebral artery origin as demonstrated on series 8, image 272. The left vertebral origin is patent, but there is evidence of some left V1 segment soft plaque and stenosis as seen on series 9, image 160. Elsewhere in the neck the and appears normal left vertebral. Artery is patent patent distal CTA HEAD Posterior circulation: Left vertebral artery with calcified plaque resulting in mild to moderate stenosis. The right V4 segment is occluded. The basilar artery is patent  although irregular throughout much of its course. There is no high-grade basilar stenosis. The SCA and PCA origins are patent. There is mild irregularity and stenosis at the left PCA origin (series 12, image 24). Posterior communicating arteries are diminutive or absent. There is contralateral moderate to severe right PCA P1/P2 junction stenosis (series 11, image 21). However, bilateral distal PCA enhancement is symmetric and within normal limits. Anterior circulation: The right ICA siphon is occluded with superimposed calcified plaque. The right ICA terminus is occluded. There is no right MCA M1 or bifurcation enhancement. There is minimal distal right MCA branch enhancement. The left ICA siphon is patent with calcified plaque resulting in moderate to severe supraclinoid segment stenosis as seen on series 10, image 107. The left ICA terminus is patent. The left MCA and ACA origins are normal. The anterior communicating artery is diminutive but patent and appears to supply the right ACA A2 and distal A1 segments. There is mild bilateral ACA irregularity. The left MCA M1 segment and left MCA bifurcation are patent without stenosis. Left MCA branches are mildly irregular. Venous sinuses: Not evaluated due to early contrast timing.  Anatomic variants: None. Review of the MIP images confirms the above findings IMPRESSION: 1. Positive for LVO (occluded Right ICA, Right ICA terminus and Right MCA) with CT Perfusion criteria favorable for endovascular reperfusion (core infarct estimated at 13 mL with mismatch volume 119 mL, mismatch ratio of 10.2). Preliminary report of this was discussed by telephone with Dr. Amie Portland on 06/08/2018 at 15:46. 2. Positive also for occluded Right Vertebral Artery and soft plaque or thrombus at the Left Vertebral Artery origin. In conjunction with #1 this might reflect sequelae of cardiac or paradoxical emboli. This was discussed with Dr. Juliet Rude in [place] at 1359 hours. 3. Superimposed  intracranial atherosclerosis including moderate or severe stenosis of the: - Left ICA supraclinoid segment. - Left vertebral artery V4 segment. - Right PCA P1/P2. 4. Stable visible upper chest since the CT on 12/01/2017. Electronically Signed   By: Genevie Ann M.D.   On: 06/08/2018 16:05   Ct Angio Neck W Or Wo Contrast  Result Date: 06/08/2018 CLINICAL DATA:  72 year old male code stroke presentation with hyperdense right M1. History of rectal cancer with treated lung metastases. EXAM: CT ANGIOGRAPHY HEAD AND NECK CT PERFUSION BRAIN TECHNIQUE: Multidetector CT imaging of the head and neck was performed using the standard protocol during bolus administration of intravenous contrast. Multiplanar CT image reconstructions and MIPs were obtained to evaluate the vascular anatomy. Carotid stenosis measurements (when applicable) are obtained utilizing NASCET criteria, using the distal internal carotid diameter as the denominator. Multiphase CT imaging of the brain was performed following IV bolus contrast injection. Subsequent parametric perfusion maps were calculated using RAPID software. CONTRAST:  131mL ISOVUE-370 IOPAMIDOL (ISOVUE-370) INJECTION 76% COMPARISON:  Noncontrast head CT 1521 hours today. Chest CT 12/01/2017. FINDINGS: CT Brain Perfusion Findings: CBF (<30%) Volume: 13 milliliters Perfusion (Tmax>6.0s) volume: 132 milliliters (hypoperfusion index of 0.5) Mismatch Volume: 119 milliliters (mismatch ratio of 10.2 Infarction Location:Deep right MCA territory white matter CTA NECK Skeleton: Absent dentition. No acute osseous abnormality identified. Upper chest: Stable partially calcified posterior left upper lobe lung nodule measuring 2-3 centimeters. Peripheral right upper lobe nodularity and pleural thickening also appears stable. No superior mediastinal lymphadenopathy. Other neck: Left nasal airway in place.  Otherwise negative. Aortic arch: 3 vessel arch configuration with mild for age arch atherosclerosis.  Right carotid system: Negative brachiocephalic and right CCA origin. The right CCA is patent, but at the right carotid bifurcation the right ICA is abruptly occluded in the mid bulb as seen on series 10, image 69. Mild to moderate superimposed right ICA origin plaque. No reconstituted enhancement in the neck. Left carotid system: Negative left CCA origin. Mildly tortuous left CCA. Soft and calcified plaque at the left carotid bifurcation, left ICA origin and bulb resulting in less than 50 % stenosis with respect to the distal vessel. Vertebral arteries: No proximal right subclavian artery stenosis despite mild plaque. There is low-density plaque or thrombus in the right vertebral artery origin and V1 segment resulting in near occlusion (series 9, image 142),. The distal right V1 segment then resumes enhancing but there is poor enhancement throughout the right vertebral artery to the skull base. Mild plaque at the left subclavian artery origin without stenosis. More bulky soft plaque at the proximal left subclavian near the left vertebral artery origin as demonstrated on series 8, image 272. The left vertebral origin is patent, but there is evidence of some left V1 segment soft plaque and stenosis as seen on series 9, image 160. Elsewhere in the neck the  and appears normal left vertebral. Artery is patent patent distal CTA HEAD Posterior circulation: Left vertebral artery with calcified plaque resulting in mild to moderate stenosis. The right V4 segment is occluded. The basilar artery is patent although irregular throughout much of its course. There is no high-grade basilar stenosis. The SCA and PCA origins are patent. There is mild irregularity and stenosis at the left PCA origin (series 12, image 24). Posterior communicating arteries are diminutive or absent. There is contralateral moderate to severe right PCA P1/P2 junction stenosis (series 11, image 21). However, bilateral distal PCA enhancement is symmetric and  within normal limits. Anterior circulation: The right ICA siphon is occluded with superimposed calcified plaque. The right ICA terminus is occluded. There is no right MCA M1 or bifurcation enhancement. There is minimal distal right MCA branch enhancement. The left ICA siphon is patent with calcified plaque resulting in moderate to severe supraclinoid segment stenosis as seen on series 10, image 107. The left ICA terminus is patent. The left MCA and ACA origins are normal. The anterior communicating artery is diminutive but patent and appears to supply the right ACA A2 and distal A1 segments. There is mild bilateral ACA irregularity. The left MCA M1 segment and left MCA bifurcation are patent without stenosis. Left MCA branches are mildly irregular. Venous sinuses: Not evaluated due to early contrast timing. Anatomic variants: None. Review of the MIP images confirms the above findings IMPRESSION: 1. Positive for LVO (occluded Right ICA, Right ICA terminus and Right MCA) with CT Perfusion criteria favorable for endovascular reperfusion (core infarct estimated at 13 mL with mismatch volume 119 mL, mismatch ratio of 10.2). Preliminary report of this was discussed by telephone with Dr. Amie Portland on 06/08/2018 at 15:46. 2. Positive also for occluded Right Vertebral Artery and soft plaque or thrombus at the Left Vertebral Artery origin. In conjunction with #1 this might reflect sequelae of cardiac or paradoxical emboli. This was discussed with Dr. Juliet Rude in [place] at 1359 hours. 3. Superimposed intracranial atherosclerosis including moderate or severe stenosis of the: - Left ICA supraclinoid segment. - Left vertebral artery V4 segment. - Right PCA P1/P2. 4. Stable visible upper chest since the CT on 12/01/2017. Electronically Signed   By: Genevie Ann M.D.   On: 06/08/2018 16:05   Ct Cerebral Perfusion W Contrast  Result Date: 06/08/2018 CLINICAL DATA:  72 year old male code stroke presentation with hyperdense right  M1. History of rectal cancer with treated lung metastases. EXAM: CT ANGIOGRAPHY HEAD AND NECK CT PERFUSION BRAIN TECHNIQUE: Multidetector CT imaging of the head and neck was performed using the standard protocol during bolus administration of intravenous contrast. Multiplanar CT image reconstructions and MIPs were obtained to evaluate the vascular anatomy. Carotid stenosis measurements (when applicable) are obtained utilizing NASCET criteria, using the distal internal carotid diameter as the denominator. Multiphase CT imaging of the brain was performed following IV bolus contrast injection. Subsequent parametric perfusion maps were calculated using RAPID software. CONTRAST:  182mL ISOVUE-370 IOPAMIDOL (ISOVUE-370) INJECTION 76% COMPARISON:  Noncontrast head CT 1521 hours today. Chest CT 12/01/2017. FINDINGS: CT Brain Perfusion Findings: CBF (<30%) Volume: 13 milliliters Perfusion (Tmax>6.0s) volume: 132 milliliters (hypoperfusion index of 0.5) Mismatch Volume: 119 milliliters (mismatch ratio of 10.2 Infarction Location:Deep right MCA territory white matter CTA NECK Skeleton: Absent dentition. No acute osseous abnormality identified. Upper chest: Stable partially calcified posterior left upper lobe lung nodule measuring 2-3 centimeters. Peripheral right upper lobe nodularity and pleural thickening also appears stable. No superior mediastinal lymphadenopathy.  Other neck: Left nasal airway in place.  Otherwise negative. Aortic arch: 3 vessel arch configuration with mild for age arch atherosclerosis. Right carotid system: Negative brachiocephalic and right CCA origin. The right CCA is patent, but at the right carotid bifurcation the right ICA is abruptly occluded in the mid bulb as seen on series 10, image 69. Mild to moderate superimposed right ICA origin plaque. No reconstituted enhancement in the neck. Left carotid system: Negative left CCA origin. Mildly tortuous left CCA. Soft and calcified plaque at the left  carotid bifurcation, left ICA origin and bulb resulting in less than 50 % stenosis with respect to the distal vessel. Vertebral arteries: No proximal right subclavian artery stenosis despite mild plaque. There is low-density plaque or thrombus in the right vertebral artery origin and V1 segment resulting in near occlusion (series 9, image 142),. The distal right V1 segment then resumes enhancing but there is poor enhancement throughout the right vertebral artery to the skull base. Mild plaque at the left subclavian artery origin without stenosis. More bulky soft plaque at the proximal left subclavian near the left vertebral artery origin as demonstrated on series 8, image 272. The left vertebral origin is patent, but there is evidence of some left V1 segment soft plaque and stenosis as seen on series 9, image 160. Elsewhere in the neck the and appears normal left vertebral. Artery is patent patent distal CTA HEAD Posterior circulation: Left vertebral artery with calcified plaque resulting in mild to moderate stenosis. The right V4 segment is occluded. The basilar artery is patent although irregular throughout much of its course. There is no high-grade basilar stenosis. The SCA and PCA origins are patent. There is mild irregularity and stenosis at the left PCA origin (series 12, image 24). Posterior communicating arteries are diminutive or absent. There is contralateral moderate to severe right PCA P1/P2 junction stenosis (series 11, image 21). However, bilateral distal PCA enhancement is symmetric and within normal limits. Anterior circulation: The right ICA siphon is occluded with superimposed calcified plaque. The right ICA terminus is occluded. There is no right MCA M1 or bifurcation enhancement. There is minimal distal right MCA branch enhancement. The left ICA siphon is patent with calcified plaque resulting in moderate to severe supraclinoid segment stenosis as seen on series 10, image 107. The left ICA  terminus is patent. The left MCA and ACA origins are normal. The anterior communicating artery is diminutive but patent and appears to supply the right ACA A2 and distal A1 segments. There is mild bilateral ACA irregularity. The left MCA M1 segment and left MCA bifurcation are patent without stenosis. Left MCA branches are mildly irregular. Venous sinuses: Not evaluated due to early contrast timing. Anatomic variants: None. Review of the MIP images confirms the above findings IMPRESSION: 1. Positive for LVO (occluded Right ICA, Right ICA terminus and Right MCA) with CT Perfusion criteria favorable for endovascular reperfusion (core infarct estimated at 13 mL with mismatch volume 119 mL, mismatch ratio of 10.2). Preliminary report of this was discussed by telephone with Dr. Amie Portland on 06/08/2018 at 15:46. 2. Positive also for occluded Right Vertebral Artery and soft plaque or thrombus at the Left Vertebral Artery origin. In conjunction with #1 this might reflect sequelae of cardiac or paradoxical emboli. This was discussed with Dr. Juliet Rude in [place] at 1359 hours. 3. Superimposed intracranial atherosclerosis including moderate or severe stenosis of the: - Left ICA supraclinoid segment. - Left vertebral artery V4 segment. - Right PCA P1/P2. 4.  Stable visible upper chest since the CT on 12/01/2017. Electronically Signed   By: Genevie Ann M.D.   On: 06/08/2018 16:05   Ct Head Code Stroke Wo Contrast  Result Date: 06/08/2018 CLINICAL DATA:  Code stroke.  72 year old male. Rightward gaze. EXAM: CT HEAD WITHOUT CONTRAST TECHNIQUE: Contiguous axial images were obtained from the base of the skull through the vertex without intravenous contrast. COMPARISON:  None. FINDINGS: Brain: Asymmetric loss of gray-white matter differentiation at the right insula on series 3, image 17. No other cytotoxic edema identified in the right MCA territory. No midline shift, ventriculomegaly, mass effect, evidence of mass lesion,  intracranial hemorrhage. Patchy bilateral white matter hypodensity. Vascular: Calcified atherosclerosis at the skull base. Hyperdense right MCA M1 segment as seen on series 5, image 26. Skull: No acute osseous abnormality identified. Sinuses/Orbits: left nasal airway. Paranasal sinuses are well pneumatized, mild right maxillary mucosal thickening. Mild right mastoid effusion. Tympanic cavities remain clear. Other: Mild rightward gaze deviation. No other acute orbit or scalp soft tissue findings. ASPECTS [facility] Stroke Program Early CT Score) - Ganglionic level infarction (caudate, lentiform nuclei, internal capsule, insula, M1-M3 cortex): 6 - Supraganglionic infarction (M4-M6 cortex): Total score (0-10 with 10 being normal): IMPRESSION: 1. Hyperdense Right MCA M1 segment compatible with ELVO. Subtle cytotoxic edema suspected in the right insula, ASPECTS is 9. 2. Negative for bleed. 3. These results were communicated to Dr. Rory Percy at 3:33 pmon 12/3/2019by text page via the [facility] messaging system. Electronically Signed   By: Genevie Ann M.D.   On: 06/08/2018 15:34   Dg Chest Port 1 View  Result Date: 06/10/2018 CLINICAL DATA:  Congestive heart failure EXAM: PORTABLE CHEST 1 VIEW COMPARISON:  Chest x-ray of 06/08/2017 FINDINGS: The endotracheal tube and NG tube have been removed. Aeration of the lungs has improved. No definite pulmonary vascular congestion or CHF is seen and no effusion is noted. Mild cardiomegaly is stable. IMPRESSION: Endotracheal tube removed.  Resolution of CHF. Electronically Signed   By: [name] M.D.   On: 06/10/2018 15:02    PHYSICAL EXAM  Temp:  [98.2 F (36.8 C)-98.7 F (37.1 C)] 98.7 F (37.1 C) (12/06 0400) Pulse Rate:  [66-95] 95 (12/06 1209) Resp:  [14-20] 18 (12/06 1209) BP: (103-125)/(45-66) 113/49 (12/06 1209) SpO2:  [96 %-100 %] 100 % (12/06 1209)  General - Well nourished, well developed, not in distress  Ophthalmologic - fundi not visualized due to  noncooperation.  Cardiovascular - Regular rate and rhythm.  Neuro - awake alert, orientated x 3, answer questions appropriately, following all simple commands. PERRL, EOMI, no gaze preference, visual field full. Left facial droop, moderate dysarthria, tongue midline. RUE and RLE 5/5, LUE proximal 3+/5 and distal 4/5 and LLE 4/5 proximal and distally. Sensation symmetrical. Coordination ataxia on the right FTN proportional to the weakness, and gait not tested.    ASSESSMENT/PLAN [name] Sr. is a 72 y.o. male with history of CKD, COPD, anemia, HTN, obesity, OSA on CPAP, rectal cancer admitted for slurry speech, left sided weakness and found down at home. No tPA given due to outside window.    Stroke:  right MCA infarct due to right ICA and MCA occlusion s/p IR and rescue stents with TICI2b reperfusion, most likely due to large vessel athero, but cardioembolic still in DDx  Resultant intubated with left hemiparesis  CT right MCA hyperdense sign  CTA head and neck - right ICA and MCA occlusion, right VA high grade stenosis vs. Occlusion, left  siphon, right P1/P2, and left VA high grade stenosis  MRI  Right MCA patchy infarcts  MRA patent right ICA, right ACA and proximal right MCA.  Decreased right MCA branches, left ICA siphon and right P2 severe stenosis.  Occluded right VA.  DSA - right ICA proximal occlusion s/p stenting with recannulization and right MCA occlusion s/p TICI 2b reperfusion with stenting. B/l VA severe stenosis origins.   2D Echo  Pending   LE venous doppler no DVT  recommend 30 day cardiac event monitoring as outpt to rule out afib  LDL 103  HgbA1c 5.5  Heparin subq for VTE prophylaxis  No antithrombotic prior to admission, now on aspirin 81 mg daily and brilinta due to intracranial stents.  Ongoing aggressive stroke risk factor management  Therapy recommendations:  CIR   Disposition:  Pending   CKD stage V s/p IV contrast  Cre  5.10->4.57->4.84->6.69->9.23  Has AVF placed recently, following with Dr. Posey Pronto  No HD yet PTA  Likely related to recent IV contrast load - difficult procedure  Encourage p.o. intake  No hyperkalemia  CXR no pleural effusion or fluid overload  Avoid nephrotoxic meds  Nephrology Dr. Hollie Salk consulted, appreciate help  Anemia   due to CKD and blood loss of groin hematoma  Groin hematoma stable now  Hb 12.7->11.3->9.5->8.1->7.1  CBC 4pm pending  Consider transfusion if Hb < 7.0  Hypertension . BP stable, on the low side . BP goal < 180 . D/c metoprolol for now  Long term BP goal normotensive  Hyperlipidemia  Home meds:  none   LDL 103, goal < 70  On lipitor 20  Continue lipitor on discharge  Other Stroke Risk Factors  Advanced age  Former Cigarette smoker, now quit  ETOH use in the past  Obstructive sleep apnea, on CPAP at home  Other Active Problems  COPD  Rectal cancer history  Leukocytosis WBC 13.6-> 12.2->10.8  Hospital day # 3  I spent  35 minutes in total face-to-face time with the patient, more than 50% of which was spent in counseling and coordination of care, reviewing test results, images and medication, and discussing the diagnosis of stroke s/p IR and stenting, CKD with worsening Cre, anemia may need transfusion, dysphagia, treatment plan and potential prognosis. This patient's care requiresreview of multiple databases, neurological assessment, discussion with family, other specialists and medical decision making of high complexity.   Rosalin Hawking, MD PhD Stroke Neurology 06/11/2018 1:04 PM    To contact Stroke Continuity provider, please refer to [email or web address] After hours, contact General Neurology

## 2018-06-11 NOTE — Progress Notes (Signed)
Physical Therapy Treatment Patient Details Name: Jason VINCELETTE Sr. MRN: [id number] DOB: 1945/09/25 Today's Date: 06/11/2018    History of Present Illness 72 year old male with rectal cancer status post chemotherapy and radiation, CKD stage V, OSA who presented with slurred speech and left-sided weakness and found with right MCA infarct secondary to right ICA and MCA occlusion status post stent and reperfusion.      PT Comments    Pt in bed with wife at bedside upon arrival. Pt was willing to participate in therapy today. Pt presenting with better mobility and was more alert this session. Pt requiring Mod A +2 to logroll to preform pericare. Patient able to stand from elevated surface with Min A this session. Pt ambulated 23 ft with RW requiring Mod A to maintain balance and VC for foot placement and proximity to RW. Pt with L lateral lean. O2 98% RA. I have discussed the patient's current level of function related to deficits (listed below) with the patient.  He acknowledged understanding of this and does not feel the he would be able to have his care needs met at home.  He is interested in post-acute rehab in an inpatient setting. Pt would benefit from continued PT in order to progress toward stated goals and maximize functional mobility. Pt remains appropriate for CIR based on current functional status.     Follow Up Recommendations  CIR;Supervision/Assistance - 24 hour     Equipment Recommendations  Other (comment)(TBA)    Recommendations for Other Services Rehab consult     Precautions / Restrictions Precautions Precautions: Fall Precaution Comments: L inattention Restrictions Weight Bearing Restrictions: No    Mobility  Bed Mobility Overal bed mobility: Needs Assistance Bed Mobility: Supine to Sit;Rolling Rolling: Mod assist;+2 for physical assistance   Supine to sit: Mod assist     General bed mobility comments: Mod A + 2 to roll and perfrom pericare. VC for hand  placement of bed rail. Mod Assist to boost trunk into sitting. VC required to use UE to elevate trunk.  Pt able to advance bil LE off EOB. VC to square up  Transfers Overall transfer level: Needs assistance Equipment used: Rolling walker (2 wheeled) Transfers: Sit to/from Stand Sit to Stand: Min assist;From elevated surface         General transfer comment: Min assist to complete. Pt impulsive to stand. VC for hand placement   Ambulation/Gait Ambulation/Gait assistance: Mod assist +2 for equipment (chair follow) Gait Distance (Feet): 75 Feet Assistive device: Rolling walker (2 wheeled) Gait Pattern/deviations: Step-to pattern;Decreased step length - left;Decreased stride length;Decreased weight shift to right;Decreased stance time - right;Ataxic;Leaning posteriorly;Staggering left     General Gait Details: Pt able to participate more in gait training this session. Pt taking small steps initally. VC for upright posture and proximity to RW.  Pt with L lateral lean and assist to navigate RW hallway   Stairs             Wheelchair Mobility    Modified Rankin (Stroke Patients Only) Modified Rankin (Stroke Patients Only) Pre-Morbid Rankin Score: No symptoms Modified Rankin: Moderately severe disability     Balance Overall balance assessment: Needs assistance Sitting-balance support: Bilateral upper extremity supported;Feet supported Sitting balance-Leahy Scale: Poor Sitting balance - Comments: Pt able to sit EOB with min gaurd for safety   Standing balance support: Bilateral upper extremity supported;During functional activity Standing balance-Leahy Scale: Poor Standing balance comment: reliant on external support  Cognition Arousal/Alertness: Awake/alert Behavior During Therapy: Flat affect Overall Cognitive Status: Impaired/Different from baseline Area of Impairment: Safety/judgement;Awareness;Problem solving;Attention                    Current Attention Level: Selective     Safety/Judgement: Decreased awareness of safety;Decreased awareness of deficits Awareness: Emergent Problem Solving: Slow processing General Comments: Pt more alert this session.      Exercises      General Comments        Pertinent Vitals/Pain Pain Assessment: No/denies pain    Home Living                      Prior Function            PT Goals (current goals can now be found in the care plan section) Acute Rehab PT Goals Patient Stated Goal: to get better PT Goal Formulation: With patient Time For Goal Achievement: 06/24/18 Potential to Achieve Goals: Good Progress towards PT goals: Progressing toward goals    Frequency    Min 4X/week      PT Plan Current plan remains appropriate    Co-evaluation              AM-PAC PT "6 Clicks" Mobility   Outcome Measure  Help needed turning from your back to your side while in a flat bed without using bedrails?: A Lot Help needed moving from lying on your back to sitting on the side of a flat bed without using bedrails?: A Little Help needed moving to and from a bed to a chair (including a wheelchair)?: A Lot Help needed standing up from a chair using your arms (e.g., wheelchair or bedside chair)?: A Little Help needed to walk in hospital room?: A Lot Help needed climbing 3-5 steps with a railing? : Total 6 Click Score: 13    End of Session Equipment Utilized During Treatment: Gait belt Activity Tolerance: Patient limited by fatigue Patient left: in chair;with call bell/phone within reach;with chair alarm set;with family/visitor present;with nursing/sitter in room Nurse Communication: Mobility status PT Visit Diagnosis: Unsteadiness on feet (R26.81);Muscle weakness (generalized) (M62.81)     Time: 9050-2561 PT Time Calculation (min) (ACUTE ONLY): 33 min  Charges:  $Gait Training: 8-22 mins $Therapeutic Activity: 8-22 mins                      [address], SPTA   [place] 06/11/2018, 4:05 PM

## 2018-06-11 NOTE — Consult Note (Signed)
Reason for Consult: To manage dialysis and dialysis related needs Referring Physician: Dr. Leward Quan Sr. is an 72 y.o. male.  HPI: Pt is a 16M with a PMH sig for CKD V, HTN. HLD, gout, rectal cancer who presented to [facility] ED 06/08/2018 with a code stroke after his wife found him down in the bathtub.  HE received IV contrast for CTA.  He was taken to IR for clot retrieval.  He has L-sided weakness.    Cr has been steadily uptrending and is now 9.23.  We are asked to see.  He follows with Dr. Posey Pronto.  He has a LUE AVF that is ready for cannulation.  Of note, he was recently discharged 11/4 with falls.  He was not uremic at that time and so didn't need to start dialysis then.    He is sleepy today but awakens to voice.  He denies f/c,n/v, CP, SOB.  He is in agreement to start dialysis.    Past Medical History:  Diagnosis Date  . Anemia   . Arthritis   . Chronic renal insufficiency 07/31/2011  . COPD (chronic obstructive pulmonary disease) ([place])    pt denies  . Deficiency anemia 07/20/2012  . Diverticulosis   . Edema 09/21/2010   Qualifier: Diagnosis of  By: Jerold Coombe    . ERECTILE DYSFUNCTION, MILD 05/11/2007   Qualifier: Diagnosis of  By: Sherren Mocha MD, Jory Ee   . Exertional dyspnea 10/30/2015  . GOUT 05/11/2007   Qualifier: Diagnosis of  By: Sherren Mocha MD, Jory Ee   . Hereditary and idiopathic peripheral neuropathy 09/14/2014  . History of radiation therapy 02/21/13-03/31/13   rectum 50.4Gy total dose  . Hypersomnia 10/30/2015  . Hypertension   . Lung nodule 10/30/2015  . OAB (overactive bladder) 01/09/2016  . Obesity (BMI 30.0-34.9) 10/30/2015  . Obstructive sleep apnea 07/29/2010   HST 11/2015 AHI 54.  On autocpap  >> 10 cm   . Peripheral edema 09/19/2013  . rectal ca dx'd 02/01/13   rectal. Radiation and chemotherapy- remains on chemotherapy-next tx. 08-22-13.   . S/P partial lobectomy of lung 11/20/2015  . S/P thoracotomy   . SUI (stress urinary incontinence), male 01/09/2016    Past  Surgical History:  Procedure Laterality Date  . AV FISTULA PLACEMENT Left 03/24/2016   Procedure: CREATION OF LEFT BRACIOCEPHALIC ARTERIOVENOUS (AV) FISTULA;  Surgeon: Elam Dutch, MD;  Location: [facility];  Service: Vascular;  Laterality: Left;  . COLON SURGERY  05/20/2013  . COLONOSCOPY  2010   [facility]  . EUS N/A 02/03/2013   Procedure: LOWER ENDOSCOPIC ULTRASOUND (EUS);  Surgeon: Milus Banister, MD;  Location: [facility];  Service: Endoscopy;  Laterality: N/A;  . ILEOSTOMY CLOSURE N/A 08/24/2013   Procedure: CLOSURE OF LOOP ILEOSTOMY ;  Surgeon: Leighton Ruff, MD;  Location: [facility];  Service: General;  Laterality: N/A;  . IR ANGIO VERTEBRAL SEL SUBCLAVIAN INNOMINATE BILAT MOD SED  06/08/2018  . IR CT HEAD LTD  06/08/2018  . IR INTRAVSC STENT CERV CAROTID W/O EMB-PROT MOD SED INC ANGIO  06/08/2018  . IR PERCUTANEOUS ART THROMBECTOMY/INFUSION INTRACRANIAL INC DIAG ANGIO  06/08/2018  . LAPAROSCOPIC LOW ANTERIOR RESECTION N/A 05/20/2013   Procedure: LAPAROSCOPIC LOW ANTERIOR RESECTION WITH SPLENIC FLEXURE MOBILIZATION;  Surgeon: Leighton Ruff, MD;  Location: [facility];  Service: General;  Laterality: N/A;  . OSTOMY N/A 05/20/2013   Procedure: diverting OSTOMY;  Surgeon: Leighton Ruff, MD;  Location: [facility];  Service: General;  Laterality:  N/A;  . RADIOLOGY WITH ANESTHESIA N/A 06/08/2018   Procedure: IR WITH ANESTHESIA;  Surgeon: Radiologist, Medication, MD;  Location: [facility];  Service: Radiology;  Laterality: N/A;  . VIDEO ASSISTED THORACOSCOPY (VATS)/WEDGE RESECTION Left 11/20/2015   Procedure: VIDEO ASSISTED THORACOSCOPY (VATS)/LUNG RESECTION;  Surgeon: [name], MD;  Location: [facility];  Service: Thoracic;  Laterality: Left;  Marland Kitchen VIDEO BRONCHOSCOPY N/A 11/20/2015   Procedure: VIDEO BRONCHOSCOPY;  Surgeon: [name], MD;  Location: [facility];  Service: Thoracic;  Laterality: N/A;    Family History  Problem Relation Age of Onset  . Stroke Mother   . Hypertension Mother   . Stroke  Father   . Hypertension Father   . Hypertension Other   . Colon cancer Neg Hx     Social History:  reports that he quit smoking about 40 years ago. His smoking use included cigarettes. He has a 5.00 pack-year smoking history. He has never used smokeless tobacco. He reports that he drinks alcohol. He reports that he does not use drugs.  Allergies:  Allergies  Allergen Reactions  . Nsaids Other (See Comments)    Asked by surgeon to add this medication class as intolerance due to patients renal insufficiency.  . Amlodipine Other (See Comments)    Edema     Medications:  Scheduled: .  stroke: mapping our early stages of recovery book   Does not apply Once  . allopurinol  100 mg Oral Daily  . aspirin  81 mg Oral Daily  . atorvastatin  20 mg Oral q1800  . calcium carbonate  1 tablet Oral Q breakfast  . chlorhexidine  15 mL Mouth Rinse BID  . cholecalciferol  5,000 Units Oral Daily  . doxazosin  8 mg Oral Daily  . feeding supplement (ENSURE ENLIVE)  237 mL Oral BID BM  . gabapentin  300 mg Oral TID  . heparin injection (subcutaneous)  5,000 Units Subcutaneous Q8H  . mouth rinse  15 mL Mouth Rinse q12n4p  . multivitamin with minerals  1 tablet Oral Daily  . pantoprazole  40 mg Oral Daily  . ticagrelor  90 mg Oral BID     Results for orders placed or performed during the hospital encounter of 06/08/18 (from the past 48 hour(s))  CBC     Status: Abnormal   Collection Time: 06/10/18  3:20 AM  Result Value Ref Range   WBC 12.2 (H) 4.0 - 10.5 K/uL   RBC 2.95 (L) 4.22 - 5.81 MIL/uL   Hemoglobin 8.1 (L) 13.0 - 17.0 g/dL   HCT 25.3 (L) 39.0 - 52.0 %   MCV 85.8 80.0 - 100.0 fL   MCH 27.5 26.0 - 34.0 pg   MCHC 32.0 30.0 - 36.0 g/dL   RDW 18.9 (H) 11.5 - 15.5 %   Platelets 131 (L) 150 - 400 K/uL    Comment: REPEATED TO VERIFY   nRBC 0.0 0.0 - 0.2 %    Comment: Performed at [facility], [place] [address], [place], [place] [address]  Basic metabolic panel     Status: Abnormal    Collection Time: 06/10/18  3:20 AM  Result Value Ref Range   Sodium 135 135 - 145 mmol/L   Potassium 4.0 3.5 - 5.1 mmol/L   Chloride 101 98 - 111 mmol/L   CO2 20 (L) 22 - 32 mmol/L   Glucose, Bld 96 70 - 99 mg/dL   BUN 71 (H) 8 - 23 mg/dL   Creatinine, Ser  6.69 (H) 0.61 - 1.24 mg/dL   Calcium 9.2 8.9 - 10.3 mg/dL   GFR calc non Af Amer 8 (L) >60 mL/min   GFR calc Af Amer 9 (L) >60 mL/min   Anion gap 14 5 - 15    Comment: Performed at [facility] [address], [place], [place] [address]  CBC     Status: Abnormal   Collection Time: 06/11/18  4:39 AM  Result Value Ref Range   WBC 10.8 (H) 4.0 - 10.5 K/uL   RBC 2.56 (L) 4.22 - 5.81 MIL/uL   Hemoglobin 7.1 (L) 13.0 - 17.0 g/dL   HCT 21.7 (L) 39.0 - 52.0 %   MCV 84.8 80.0 - 100.0 fL   MCH 27.7 26.0 - 34.0 pg   MCHC 32.7 30.0 - 36.0 g/dL   RDW 19.0 (H) 11.5 - 15.5 %   Platelets 119 (L) 150 - 400 K/uL    Comment: REPEATED TO VERIFY PLATELET COUNT CONFIRMED BY SMEAR    nRBC 0.0 0.0 - 0.2 %    Comment: Performed at [facility], [place] [address] [place], [place] [address]  Basic metabolic panel     Status: Abnormal   Collection Time: 06/11/18  4:39 AM  Result Value Ref Range   Sodium 133 (L) 135 - 145 mmol/L   Potassium 4.5 3.5 - 5.1 mmol/L   Chloride 100 98 - 111 mmol/L   CO2 18 (L) 22 - 32 mmol/L   Glucose, Bld 83 70 - 99 mg/dL   BUN 87 (H) 8 - 23 mg/dL   Creatinine, Ser 9.23 (H) 0.61 - 1.24 mg/dL   Calcium 9.0 8.9 - 10.3 mg/dL   GFR calc non Af Amer 5 (L) >60 mL/min   GFR calc Af Amer 6 (L) >60 mL/min   Anion gap 15 5 - 15    Comment: Performed at [facility] [address] [place], [place] [address]  Hemoglobin and hematocrit, blood     Status: Abnormal   Collection Time: 06/11/18  3:13 PM  Result Value Ref Range   Hemoglobin 7.0 (L) 13.0 - 17.0 g/dL    Comment: REPEATED TO VERIFY   HCT 21.2 (L) 39.0 - 52.0 %    Comment: Performed at [facility] [address], [place], [place] [address]     Dg Chest Port 1 View  Result Date: 06/10/2018 CLINICAL DATA:  Congestive heart failure EXAM: PORTABLE CHEST 1 VIEW COMPARISON:  Chest x-ray of 06/08/2017 FINDINGS: The endotracheal tube and NG tube have been removed. Aeration of the lungs has improved. No definite pulmonary vascular congestion or CHF is seen and no effusion is noted. Mild cardiomegaly is stable. IMPRESSION: Endotracheal tube removed.  Resolution of CHF. Electronically Signed   By: [name] M.D.   On: 06/10/2018 15:02    ROS: all other systems reviewed and are negative Blood pressure (!) 130/59, pulse 96, temperature 98.1 F (36.7 C), temperature source Oral, resp. rate 20, height 5\' 8"  (1.727 m), weight 83.9 kg, SpO2 100 %. . GEN: sleeping, easily arousable HEENT muddy sclerae NECK no JVD PULM clear anteriorly CV RRR ABD soft EXT no LE edema NEURO L=sided weakness ACCESS: LUE AVF + T/B  Assessment/Plan: 1 Acute stroke- s/p IR clot retrieval.  Per primary.  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  Pt in agreement HD #1.  Will need CLIP 3 Hypertension: permissive HTN 4. Anemia of ESRD:  will dose ESA as appropriate 5. Metabolic Bone Disease: will dose calcitriol as appropriate   Pleasant Bensinger 06/11/2018, 5:44 PM

## 2018-06-11 NOTE — Progress Notes (Signed)
Pt up in the chair, wife at bedside

## 2018-06-11 NOTE — Progress Notes (Signed)
Back to bed, no complaints

## 2018-06-11 NOTE — Progress Notes (Signed)
Pt very sleepy but easily arousal.

## 2018-06-12 LAB — BASIC METABOLIC PANEL
Anion gap: 18 — ABNORMAL HIGH (ref 5–15)
BUN: 104 mg/dL — ABNORMAL HIGH (ref 8–23)
CO2: 17 mmol/L — ABNORMAL LOW (ref 22–32)
Calcium: 9.3 mg/dL (ref 8.9–10.3)
Chloride: 98 mmol/L (ref 98–111)
Creatinine, Ser: 10.53 mg/dL — ABNORMAL HIGH (ref 0.61–1.24)
GFR calc Af Amer: 5 mL/min — ABNORMAL LOW (ref >60)
GFR, EST NON AFRICAN AMERICAN: 4 mL/min — AB (ref >60)
GLUCOSE: 92 mg/dL (ref 70–99)
POTASSIUM: 5 mmol/L (ref 3.5–5.1)
Sodium: 133 mmol/L — ABNORMAL LOW (ref 135–145)

## 2018-06-12 LAB — FERRITIN: Ferritin: 586 ng/mL — ABNORMAL HIGH (ref 24–336)

## 2018-06-12 LAB — CBC
HCT: 21.1 % — ABNORMAL LOW (ref 39.0–52.0)
Hemoglobin: 6.7 g/dL — CL (ref 13.0–17.0)
MCH: 27 pg (ref 26.0–34.0)
MCHC: 31.8 g/dL (ref 30.0–36.0)
MCV: 85.1 fL (ref 80.0–100.0)
Platelets: 126 10*3/uL — ABNORMAL LOW (ref 150–400)
RBC: 2.48 MIL/uL — ABNORMAL LOW (ref 4.22–5.81)
RDW: 18.9 % — ABNORMAL HIGH (ref 11.5–15.5)
WBC: 9.2 10*3/uL (ref 4.0–10.5)
nRBC: 0 % (ref 0.0–0.2)

## 2018-06-12 LAB — IRON AND TIBC
Iron: 31 ug/dL — ABNORMAL LOW (ref 45–182)
Saturation Ratios: 20 % (ref 17.9–39.5)
TIBC: 158 ug/dL — ABNORMAL LOW (ref 250–450)
UIBC: 127 ug/dL

## 2018-06-12 LAB — PREPARE RBC (CROSSMATCH)

## 2018-06-12 MED ORDER — FUROSEMIDE 10 MG/ML IJ SOLN
80.0000 mg | Freq: Once | INTRAMUSCULAR | Status: AC
  Administered 2018-06-12: 80 mg via INTRAVENOUS
  Filled 2018-06-12: qty 8

## 2018-06-12 MED ORDER — SODIUM CHLORIDE 0.9% IV SOLUTION
Freq: Once | INTRAVENOUS | Status: AC
  Administered 2018-06-12: 06:00:00 via INTRAVENOUS

## 2018-06-12 MED ORDER — DARBEPOETIN ALFA 100 MCG/0.5ML IJ SOSY
100.0000 ug | PREFILLED_SYRINGE | INTRAMUSCULAR | Status: DC
  Filled 2018-06-12: qty 0.5

## 2018-06-12 MED ORDER — DIPHENHYDRAMINE HCL 25 MG PO CAPS
25.0000 mg | ORAL_CAPSULE | Freq: Once | ORAL | Status: AC
  Administered 2018-06-12: 25 mg via ORAL
  Filled 2018-06-12: qty 1

## 2018-06-12 MED ORDER — FUROSEMIDE 10 MG/ML IJ SOLN
INTRAMUSCULAR | Status: AC
  Filled 2018-06-12: qty 4

## 2018-06-12 MED ORDER — ACETAMINOPHEN 325 MG PO TABS
650.0000 mg | ORAL_TABLET | Freq: Once | ORAL | Status: AC
  Administered 2018-06-12: 650 mg via ORAL
  Filled 2018-06-12: qty 2

## 2018-06-12 MED ORDER — RENA-VITE PO TABS
1.0000 | ORAL_TABLET | Freq: Every day | ORAL | Status: DC
  Administered 2018-06-13 – 2018-06-16 (×3): 1 via ORAL
  Filled 2018-06-12 (×3): qty 1

## 2018-06-12 NOTE — Plan of Care (Signed)
  Problem: Education: Goal: Knowledge of disease or condition will improve Outcome: Progressing Note:  POC reviewed with pt.; pt. lethargic and arousable.

## 2018-06-12 NOTE — Progress Notes (Signed)
Unable to start HD treatment due to difficulty to cannulate and infiltration, Dr Hollie Salk made aware

## 2018-06-12 NOTE — Progress Notes (Signed)
STROKE TEAM PROGRESS NOTE   SUBJECTIVE (INTERVAL HISTORY) His wife is at the bedside. He was sitting up in bed  for lunch.  He is scheduled to receive blood transfusion today during dialysis.as his hemoglobin has dropped to 6.7.no new bleeding in the groin.  OBJECTIVE Temp:  [97.8 F (36.6 C)-99.2 F (37.3 C)] 97.8 F (36.6 C) (12/07 1200) Pulse Rate:  [90-108] 95 (12/07 1200) Cardiac Rhythm: Sinus tachycardia;Heart block (12/07 0710) Resp:  [16-20] 17 (12/07 1200) BP: (124-163)/(59-85) 160/70 (12/07 1200) SpO2:  [97 %-100 %] 97 % (12/07 1200)  Recent Labs  Lab 06/08/18 1515 06/09/18 0812  GLUCAP 102* 103*   Recent Labs  Lab 06/09/18 0454 06/10/18 0320 06/11/18 0439 06/11/18 1854 06/12/18 0511  NA 138 135 133* 131* 133*  K 3.8 4.0 4.5 4.6 5.0  CL 105 101 100 97* 98  CO2 22 20* 18* 18* 17*  GLUCOSE 76 96 83 105* 92  BUN 58* 71* 87* 95* 104*  CREATININE 4.84* 6.69* 9.23* 9.83* 10.53*  CALCIUM 9.4 9.2 9.0 9.0 9.3  PHOS  --   --   --  5.1*  --    Recent Labs  Lab 06/08/18 1514 06/11/18 1854  AST 18  --   ALT 15  --   ALKPHOS 87  --   BILITOT 0.9  --   PROT 6.7  --   ALBUMIN 4.0 2.6*   Recent Labs  Lab 06/08/18 1514  06/09/18 0454 06/09/18 1459 06/10/18 0320 06/11/18 0439 06/11/18 1513 06/12/18 0511  WBC 8.3  --  9.1 13.6* 12.2* 10.8*  --  9.2  NEUTROABS 6.8  --  7.6  --   --   --   --   --   HGB 12.7*   < > 11.3* 9.5* 8.1* 7.1* 7.0* 6.7*  HCT 39.9   < > 34.3* 30.0* 25.3* 21.7* 21.2* 21.1*  MCV 87.9  --  84.3 86.2 85.8 84.8  --  85.1  PLT 153  --  149* 148* 131* 119*  --  126*   < > = values in this interval not displayed.   No results for input(s): CKTOTAL, CKMB, CKMBINDEX, TROPONINI in the last 168 hours. No results for input(s): LABPROT, INR in the last 72 hours. No results for input(s): COLORURINE, LABSPEC, [place], GLUCOSEU, HGBUR, BILIRUBINUR, KETONESUR, PROTEINUR, UROBILINOGEN, NITRITE, LEUKOCYTESUR in the last 72 hours.  Invalid input(s):  APPERANCEUR     Component Value Date/Time   CHOL 171 06/09/2018 0454   TRIG 101 06/09/2018 0454   TRIG 104 05/11/2006 1014   HDL 48 06/09/2018 0454   CHOLHDL 3.6 06/09/2018 0454   VLDL 20 06/09/2018 0454   LDLCALC 103 (H) 06/09/2018 0454   Lab Results  Component Value Date   HGBA1C 5.5 06/09/2018      Component Value Date/Time   LABOPIA NONE DETECTED 06/09/2018 0418   COCAINSCRNUR NONE DETECTED 06/09/2018 0418   LABBENZ NONE DETECTED 06/09/2018 0418   AMPHETMU NONE DETECTED 06/09/2018 0418   THCU NONE DETECTED 06/09/2018 0418   LABBARB NONE DETECTED 06/09/2018 0418    Recent Labs  Lab 06/08/18 1514  ETH <10    I have personally reviewed the radiological images below and agree with the radiology interpretations.  Ct Angio Head W Or Wo Contrast  Result Date: 06/08/2018 CLINICAL DATA:  72 year old male code stroke presentation with hyperdense right M1. History of rectal cancer with treated lung metastases. EXAM: CT ANGIOGRAPHY HEAD AND NECK CT PERFUSION BRAIN TECHNIQUE: Multidetector CT  imaging of the head and neck was performed using the standard protocol during bolus administration of intravenous contrast. Multiplanar CT image reconstructions and MIPs were obtained to evaluate the vascular anatomy. Carotid stenosis measurements (when applicable) are obtained utilizing NASCET criteria, using the distal internal carotid diameter as the denominator. Multiphase CT imaging of the brain was performed following IV bolus contrast injection. Subsequent parametric perfusion maps were calculated using RAPID software. CONTRAST:  135mL ISOVUE-370 IOPAMIDOL (ISOVUE-370) INJECTION 76% COMPARISON:  Noncontrast head CT 1521 hours today. Chest CT 12/01/2017. FINDINGS: CT Brain Perfusion Findings: CBF (<30%) Volume: 13 milliliters Perfusion (Tmax>6.0s) volume: 132 milliliters (hypoperfusion index of 0.5) Mismatch Volume: 119 milliliters (mismatch ratio of 10.2 Infarction Location:Deep right MCA  territory white matter CTA NECK Skeleton: Absent dentition. No acute osseous abnormality identified. Upper chest: Stable partially calcified posterior left upper lobe lung nodule measuring 2-3 centimeters. Peripheral right upper lobe nodularity and pleural thickening also appears stable. No superior mediastinal lymphadenopathy. Other neck: Left nasal airway in place.  Otherwise negative. Aortic arch: 3 vessel arch configuration with mild for age arch atherosclerosis. Right carotid system: Negative brachiocephalic and right CCA origin. The right CCA is patent, but at the right carotid bifurcation the right ICA is abruptly occluded in the mid bulb as seen on series 10, image 69. Mild to moderate superimposed right ICA origin plaque. No reconstituted enhancement in the neck. Left carotid system: Negative left CCA origin. Mildly tortuous left CCA. Soft and calcified plaque at the left carotid bifurcation, left ICA origin and bulb resulting in less than 50 % stenosis with respect to the distal vessel. Vertebral arteries: No proximal right subclavian artery stenosis despite mild plaque. There is low-density plaque or thrombus in the right vertebral artery origin and V1 segment resulting in near occlusion (series 9, image 142),. The distal right V1 segment then resumes enhancing but there is poor enhancement throughout the right vertebral artery to the skull base. Mild plaque at the left subclavian artery origin without stenosis. More bulky soft plaque at the proximal left subclavian near the left vertebral artery origin as demonstrated on series 8, image 272. The left vertebral origin is patent, but there is evidence of some left V1 segment soft plaque and stenosis as seen on series 9, image 160. Elsewhere in the neck the and appears normal left vertebral. Artery is patent patent distal CTA HEAD Posterior circulation: Left vertebral artery with calcified plaque resulting in mild to moderate stenosis. The right V4 segment  is occluded. The basilar artery is patent although irregular throughout much of its course. There is no high-grade basilar stenosis. The SCA and PCA origins are patent. There is mild irregularity and stenosis at the left PCA origin (series 12, image 24). Posterior communicating arteries are diminutive or absent. There is contralateral moderate to severe right PCA P1/P2 junction stenosis (series 11, image 21). However, bilateral distal PCA enhancement is symmetric and within normal limits. Anterior circulation: The right ICA siphon is occluded with superimposed calcified plaque. The right ICA terminus is occluded. There is no right MCA M1 or bifurcation enhancement. There is minimal distal right MCA branch enhancement. The left ICA siphon is patent with calcified plaque resulting in moderate to severe supraclinoid segment stenosis as seen on series 10, image 107. The left ICA terminus is patent. The left MCA and ACA origins are normal. The anterior communicating artery is diminutive but patent and appears to supply the right ACA A2 and distal A1 segments. There is mild bilateral ACA irregularity.  The left MCA M1 segment and left MCA bifurcation are patent without stenosis. Left MCA branches are mildly irregular. Venous sinuses: Not evaluated due to early contrast timing. Anatomic variants: None. Review of the MIP images confirms the above findings IMPRESSION: 1. Positive for LVO (occluded Right ICA, Right ICA terminus and Right MCA) with CT Perfusion criteria favorable for endovascular reperfusion (core infarct estimated at 13 mL with mismatch volume 119 mL, mismatch ratio of 10.2). Preliminary report of this was discussed by telephone with Dr. Amie Portland on 06/08/2018 at 15:46. 2. Positive also for occluded Right Vertebral Artery and soft plaque or thrombus at the Left Vertebral Artery origin. In conjunction with #1 this might reflect sequelae of cardiac or paradoxical emboli. This was discussed with Dr. Juliet Rude in [place] at 1359 hours. 3. Superimposed intracranial atherosclerosis including moderate or severe stenosis of the: - Left ICA supraclinoid segment. - Left vertebral artery V4 segment. - Right PCA P1/P2. 4. Stable visible upper chest since the CT on 12/01/2017. Electronically Signed   By: Genevie Ann M.D.   On: 06/08/2018 16:05   Ct Angio Neck W Or Wo Contrast  Result Date: 06/08/2018 CLINICAL DATA:  72 year old male code stroke presentation with hyperdense right M1. History of rectal cancer with treated lung metastases. EXAM: CT ANGIOGRAPHY HEAD AND NECK CT PERFUSION BRAIN TECHNIQUE: Multidetector CT imaging of the head and neck was performed using the standard protocol during bolus administration of intravenous contrast. Multiplanar CT image reconstructions and MIPs were obtained to evaluate the vascular anatomy. Carotid stenosis measurements (when applicable) are obtained utilizing NASCET criteria, using the distal internal carotid diameter as the denominator. Multiphase CT imaging of the brain was performed following IV bolus contrast injection. Subsequent parametric perfusion maps were calculated using RAPID software. CONTRAST:  155mL ISOVUE-370 IOPAMIDOL (ISOVUE-370) INJECTION 76% COMPARISON:  Noncontrast head CT 1521 hours today. Chest CT 12/01/2017. FINDINGS: CT Brain Perfusion Findings: CBF (<30%) Volume: 13 milliliters Perfusion (Tmax>6.0s) volume: 132 milliliters (hypoperfusion index of 0.5) Mismatch Volume: 119 milliliters (mismatch ratio of 10.2 Infarction Location:Deep right MCA territory white matter CTA NECK Skeleton: Absent dentition. No acute osseous abnormality identified. Upper chest: Stable partially calcified posterior left upper lobe lung nodule measuring 2-3 centimeters. Peripheral right upper lobe nodularity and pleural thickening also appears stable. No superior mediastinal lymphadenopathy. Other neck: Left nasal airway in place.  Otherwise negative. Aortic arch: 3 vessel arch  configuration with mild for age arch atherosclerosis. Right carotid system: Negative brachiocephalic and right CCA origin. The right CCA is patent, but at the right carotid bifurcation the right ICA is abruptly occluded in the mid bulb as seen on series 10, image 69. Mild to moderate superimposed right ICA origin plaque. No reconstituted enhancement in the neck. Left carotid system: Negative left CCA origin. Mildly tortuous left CCA. Soft and calcified plaque at the left carotid bifurcation, left ICA origin and bulb resulting in less than 50 % stenosis with respect to the distal vessel. Vertebral arteries: No proximal right subclavian artery stenosis despite mild plaque. There is low-density plaque or thrombus in the right vertebral artery origin and V1 segment resulting in near occlusion (series 9, image 142),. The distal right V1 segment then resumes enhancing but there is poor enhancement throughout the right vertebral artery to the skull base. Mild plaque at the left subclavian artery origin without stenosis. More bulky soft plaque at the proximal left subclavian near the left vertebral artery origin as demonstrated on series 8, image 272. The left vertebral  origin is patent, but there is evidence of some left V1 segment soft plaque and stenosis as seen on series 9, image 160. Elsewhere in the neck the and appears normal left vertebral. Artery is patent patent distal CTA HEAD Posterior circulation: Left vertebral artery with calcified plaque resulting in mild to moderate stenosis. The right V4 segment is occluded. The basilar artery is patent although irregular throughout much of its course. There is no high-grade basilar stenosis. The SCA and PCA origins are patent. There is mild irregularity and stenosis at the left PCA origin (series 12, image 24). Posterior communicating arteries are diminutive or absent. There is contralateral moderate to severe right PCA P1/P2 junction stenosis (series 11, image 21).  However, bilateral distal PCA enhancement is symmetric and within normal limits. Anterior circulation: The right ICA siphon is occluded with superimposed calcified plaque. The right ICA terminus is occluded. There is no right MCA M1 or bifurcation enhancement. There is minimal distal right MCA branch enhancement. The left ICA siphon is patent with calcified plaque resulting in moderate to severe supraclinoid segment stenosis as seen on series 10, image 107. The left ICA terminus is patent. The left MCA and ACA origins are normal. The anterior communicating artery is diminutive but patent and appears to supply the right ACA A2 and distal A1 segments. There is mild bilateral ACA irregularity. The left MCA M1 segment and left MCA bifurcation are patent without stenosis. Left MCA branches are mildly irregular. Venous sinuses: Not evaluated due to early contrast timing. Anatomic variants: None. Review of the MIP images confirms the above findings IMPRESSION: 1. Positive for LVO (occluded Right ICA, Right ICA terminus and Right MCA) with CT Perfusion criteria favorable for endovascular reperfusion (core infarct estimated at 13 mL with mismatch volume 119 mL, mismatch ratio of 10.2). Preliminary report of this was discussed by telephone with Dr. Amie Portland on 06/08/2018 at 15:46. 2. Positive also for occluded Right Vertebral Artery and soft plaque or thrombus at the Left Vertebral Artery origin. In conjunction with #1 this might reflect sequelae of cardiac or paradoxical emboli. This was discussed with Dr. Juliet Rude in [place] at 1359 hours. 3. Superimposed intracranial atherosclerosis including moderate or severe stenosis of the: - Left ICA supraclinoid segment. - Left vertebral artery V4 segment. - Right PCA P1/P2. 4. Stable visible upper chest since the CT on 12/01/2017. Electronically Signed   By: Genevie Ann M.D.   On: 06/08/2018 16:05   Ct Cerebral Perfusion W Contrast  Result Date: 06/08/2018 CLINICAL DATA:   72 year old male code stroke presentation with hyperdense right M1. History of rectal cancer with treated lung metastases. EXAM: CT ANGIOGRAPHY HEAD AND NECK CT PERFUSION BRAIN TECHNIQUE: Multidetector CT imaging of the head and neck was performed using the standard protocol during bolus administration of intravenous contrast. Multiplanar CT image reconstructions and MIPs were obtained to evaluate the vascular anatomy. Carotid stenosis measurements (when applicable) are obtained utilizing NASCET criteria, using the distal internal carotid diameter as the denominator. Multiphase CT imaging of the brain was performed following IV bolus contrast injection. Subsequent parametric perfusion maps were calculated using RAPID software. CONTRAST:  111mL ISOVUE-370 IOPAMIDOL (ISOVUE-370) INJECTION 76% COMPARISON:  Noncontrast head CT 1521 hours today. Chest CT 12/01/2017. FINDINGS: CT Brain Perfusion Findings: CBF (<30%) Volume: 13 milliliters Perfusion (Tmax>6.0s) volume: 132 milliliters (hypoperfusion index of 0.5) Mismatch Volume: 119 milliliters (mismatch ratio of 10.2 Infarction Location:Deep right MCA territory white matter CTA NECK Skeleton: Absent dentition. No acute osseous abnormality identified. Upper  chest: Stable partially calcified posterior left upper lobe lung nodule measuring 2-3 centimeters. Peripheral right upper lobe nodularity and pleural thickening also appears stable. No superior mediastinal lymphadenopathy. Other neck: Left nasal airway in place.  Otherwise negative. Aortic arch: 3 vessel arch configuration with mild for age arch atherosclerosis. Right carotid system: Negative brachiocephalic and right CCA origin. The right CCA is patent, but at the right carotid bifurcation the right ICA is abruptly occluded in the mid bulb as seen on series 10, image 69. Mild to moderate superimposed right ICA origin plaque. No reconstituted enhancement in the neck. Left carotid system: Negative left CCA origin.  Mildly tortuous left CCA. Soft and calcified plaque at the left carotid bifurcation, left ICA origin and bulb resulting in less than 50 % stenosis with respect to the distal vessel. Vertebral arteries: No proximal right subclavian artery stenosis despite mild plaque. There is low-density plaque or thrombus in the right vertebral artery origin and V1 segment resulting in near occlusion (series 9, image 142),. The distal right V1 segment then resumes enhancing but there is poor enhancement throughout the right vertebral artery to the skull base. Mild plaque at the left subclavian artery origin without stenosis. More bulky soft plaque at the proximal left subclavian near the left vertebral artery origin as demonstrated on series 8, image 272. The left vertebral origin is patent, but there is evidence of some left V1 segment soft plaque and stenosis as seen on series 9, image 160. Elsewhere in the neck the and appears normal left vertebral. Artery is patent patent distal CTA HEAD Posterior circulation: Left vertebral artery with calcified plaque resulting in mild to moderate stenosis. The right V4 segment is occluded. The basilar artery is patent although irregular throughout much of its course. There is no high-grade basilar stenosis. The SCA and PCA origins are patent. There is mild irregularity and stenosis at the left PCA origin (series 12, image 24). Posterior communicating arteries are diminutive or absent. There is contralateral moderate to severe right PCA P1/P2 junction stenosis (series 11, image 21). However, bilateral distal PCA enhancement is symmetric and within normal limits. Anterior circulation: The right ICA siphon is occluded with superimposed calcified plaque. The right ICA terminus is occluded. There is no right MCA M1 or bifurcation enhancement. There is minimal distal right MCA branch enhancement. The left ICA siphon is patent with calcified plaque resulting in moderate to severe supraclinoid  segment stenosis as seen on series 10, image 107. The left ICA terminus is patent. The left MCA and ACA origins are normal. The anterior communicating artery is diminutive but patent and appears to supply the right ACA A2 and distal A1 segments. There is mild bilateral ACA irregularity. The left MCA M1 segment and left MCA bifurcation are patent without stenosis. Left MCA branches are mildly irregular. Venous sinuses: Not evaluated due to early contrast timing. Anatomic variants: None. Review of the MIP images confirms the above findings IMPRESSION: 1. Positive for LVO (occluded Right ICA, Right ICA terminus and Right MCA) with CT Perfusion criteria favorable for endovascular reperfusion (core infarct estimated at 13 mL with mismatch volume 119 mL, mismatch ratio of 10.2). Preliminary report of this was discussed by telephone with Dr. Amie Portland on 06/08/2018 at 15:46. 2. Positive also for occluded Right Vertebral Artery and soft plaque or thrombus at the Left Vertebral Artery origin. In conjunction with #1 this might reflect sequelae of cardiac or paradoxical emboli. This was discussed with Dr. Juliet Rude in [place] at [phone]  hours. 3. Superimposed intracranial atherosclerosis including moderate or severe stenosis of the: - Left ICA supraclinoid segment. - Left vertebral artery V4 segment. - Right PCA P1/P2. 4. Stable visible upper chest since the CT on 12/01/2017. Electronically Signed   By: Genevie Ann M.D.   On: 06/08/2018 16:05   Ct Head Code Stroke Wo Contrast  Result Date: 06/08/2018 CLINICAL DATA:  Code stroke.  72 year old male. Rightward gaze. EXAM: CT HEAD WITHOUT CONTRAST TECHNIQUE: Contiguous axial images were obtained from the base of the skull through the vertex without intravenous contrast. COMPARISON:  None. FINDINGS: Brain: Asymmetric loss of gray-white matter differentiation at the right insula on series 3, image 17. No other cytotoxic edema identified in the right MCA territory. No midline shift,  ventriculomegaly, mass effect, evidence of mass lesion, intracranial hemorrhage. Patchy bilateral white matter hypodensity. Vascular: Calcified atherosclerosis at the skull base. Hyperdense right MCA M1 segment as seen on series 5, image 26. Skull: No acute osseous abnormality identified. Sinuses/Orbits: left nasal airway. Paranasal sinuses are well pneumatized, mild right maxillary mucosal thickening. Mild right mastoid effusion. Tympanic cavities remain clear. Other: Mild rightward gaze deviation. No other acute orbit or scalp soft tissue findings. ASPECTS [facility] Stroke Program Early CT Score) - Ganglionic level infarction (caudate, lentiform nuclei, internal capsule, insula, M1-M3 cortex): 6 - Supraganglionic infarction (M4-M6 cortex): Total score (0-10 with 10 being normal): IMPRESSION: 1. Hyperdense Right MCA M1 segment compatible with ELVO. Subtle cytotoxic edema suspected in the right insula, ASPECTS is 9. 2. Negative for bleed. 3. These results were communicated to Dr. Rory Percy at 3:33 pmon 12/3/2019by text page via the [facility] messaging system. Electronically Signed   By: Genevie Ann M.D.   On: 06/08/2018 15:34   Dg Chest Port 1 View  Result Date: 06/10/2018 CLINICAL DATA:  Congestive heart failure EXAM: PORTABLE CHEST 1 VIEW COMPARISON:  Chest x-ray of 06/08/2017 FINDINGS: The endotracheal tube and NG tube have been removed. Aeration of the lungs has improved. No definite pulmonary vascular congestion or CHF is seen and no effusion is noted. Mild cardiomegaly is stable. IMPRESSION: Endotracheal tube removed.  Resolution of CHF. Electronically Signed   By: [name] M.D.   On: 06/10/2018 15:02    PHYSICAL EXAM  Temp:  [97.8 F (36.6 C)-99.2 F (37.3 C)] 97.8 F (36.6 C) (12/07 1200) Pulse Rate:  [90-108] 95 (12/07 1200) Resp:  [16-20] 17 (12/07 1200) BP: (124-163)/(59-85) 160/70 (12/07 1200) SpO2:  [97 %-100 %] 97 % (12/07 1200)   pleasant obese elderly African-American male not in distress. He  has a dialysis fistula in the left forearm.  . Afebrile. Head is nontraumatic. Neck is supple without bruit.    Cardiac exam no murmur or gallop. Lungs are clear to auscultation. Distal pulses are well felt. Neuro - awake alert, orientated x 3, answer questions appropriately, following all simple commands. PERRL, EOMI, no gaze preference, visual field full. Left facial droop, moderate dysarthria, tongue midline. RUE and RLE 5/5, LUE proximal 3+/5 and distal 4/5 and LLE 4/5 proximal and distally. Sensation symmetrical. Coordination ataxia on the right FTN proportional to the weakness, and gait not tested.    ASSESSMENT/PLAN [name] Sr. is a 72 y.o. male with history of CKD, COPD, anemia, HTN, obesity, OSA on CPAP, rectal cancer admitted for slurry speech, left sided weakness and found down at home. No tPA given due to outside window.    Stroke:  right MCA infarct due to right ICA and MCA occlusion s/p  IR and rescue stents with TICI2b reperfusion, most likely due to large vessel athero, but cardioembolic still in DDx  Resultant intubated with left hemiparesis  CT right MCA hyperdense sign  CTA head and neck - right ICA and MCA occlusion, right VA high grade stenosis vs. Occlusion, left siphon, right P1/P2, and left VA high grade stenosis  MRI  Right MCA patchy infarcts  MRA patent right ICA, right ACA and proximal right MCA.  Decreased right MCA branches, left ICA siphon and right P2 severe stenosis.  Occluded right VA.  DSA - right ICA proximal occlusion s/p stenting with recannulization and right MCA occlusion s/p TICI 2b reperfusion with stenting. B/l VA severe stenosis origins.   2D Echo  Pending   LE venous doppler no DVT  recommend 30 day cardiac event monitoring as outpt to rule out afib  LDL 103  HgbA1c 5.5  Heparin subq for VTE prophylaxis  No antithrombotic prior to admission, now on aspirin 81 mg daily and brilinta due to intracranial stents.  Ongoing aggressive  stroke risk factor management  Therapy recommendations:  CIR   Disposition:  Pending   CKD stage V s/p IV contrast  Cre 5.10->4.57->4.84->6.69->9.23  Has AVF placed recently, following with Dr. Posey Pronto  No HD yet PTA  Likely related to recent IV contrast load - difficult procedure  Encourage p.o. intake  No hyperkalemia  CXR no pleural effusion or fluid overload  Avoid nephrotoxic meds  Nephrology Dr. Hollie Salk consulted, appreciate help  Anemia   due to CKD and blood loss of groin hematoma  Groin hematoma stable now  Hb 12.7->11.3->9.5->8.1->7.1  CBC 4pm pending  Consider transfusion if Hb < 7.0  Hypertension . BP stable, on the low side . BP goal < 180 . D/c metoprolol for now  Long term BP goal normotensive  Hyperlipidemia  Home meds:  none   LDL 103, goal < 70  On lipitor 20  Continue lipitor on discharge  Other Stroke Risk Factors  Advanced age  Former Cigarette smoker, now quit  ETOH use in the past  Obstructive sleep apnea, on CPAP at home  Other Active Problems  COPD  Rectal cancer history  Leukocytosis WBC 13.6-> 12.2->10.8  Hospital day # 4  I have personally examined this patient, reviewed notes, independently viewed imaging studies, participated in medical decision making and plan of care.ROS completed by me personally and pertinent positives fully documented  I have made any additions or clarifications directly to the above note.ibuprofen had a long discussion with the patient and his wife regarding his plan of care and answered questions. Recommend blood transfusion today during dialysis. Transfer to inpatient rehabilitation in a few days after bed ability and insurance approval. Greater than 50% and during this 25 minute visit was spent in counseling and coordination of care about his stroke , anemia, renal failure, discussion about plan of care and answering questions Antony Contras, MD Medical Director [place] Pager: [phone] 06/12/2018 2:46 PM    Antony Contras, M.D. Stroke Neurology 06/12/2018 2:45 PM    To contact Stroke Continuity provider, please refer to [email or web address] After hours, contact General Neurology

## 2018-06-12 NOTE — Progress Notes (Signed)
Patient Placed on CPAP and tolerating well. NO issues at this time.

## 2018-06-12 NOTE — Progress Notes (Signed)
CRITICAL VALUE ALERT  Critical Value: Hgb. 6.7  Date & Time Notified:  06/12/18 0612  Provider Notified: Dr. Lorraine Lax  Orders Received/Actions taken: none

## 2018-06-12 NOTE — Progress Notes (Signed)
[facility] Progress Note   Assessment/ Plan:   1. Acute stroke- s/p IR clot retrieval.  Per primary.  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  Pt in agreement HD #1.  Will need CLIP, process started 3 Hypertension: permissive HTN 4. Anemia of ESRD: will dose ESA as appropriate- hgb 6.7 today, has been drifting down, pt denies overt bleeding, will give 1u pRBCs in HD today and aranesp  5. Metabolic Bone Disease: will dose calcitriol as appropriate, check PTH 6.  Dispo: ? CIR  Subjective:    For HD #1 today.  Hgb 6.7, 1 u PRBCs ordered.   Objective:   BP 137/68 (BP Location: Right Arm)   Pulse 98   Temp 98.5 F (36.9 C) (Oral)   Resp 16   Ht 5\' 8"  (1.727 m)   Wt 83.9 kg   SpO2 100%   BMI 28.12 kg/m   Physical Exam: GEN: sitting up, wife at bedside HEENT muddy sclerae NECK no JVD PULM clear anteriorly CV RRR ABD soft EXT no LE edema NEURO L=sided weakness ACCESS: LUE AVF + T/B Labs: BMET Recent Labs  Lab 06/08/18 1514 06/08/18 1526 06/08/18 2156 06/09/18 0454 06/10/18 0320 06/11/18 0439 06/11/18 1854 06/12/18 0511  NA 137 138 134* 138 135 133* 131* 133*  K 3.4* 3.4* 3.9 3.8 4.0 4.5 4.6 5.0  CL 103 107 101 105 101 100 97* 98  CO2 20*  --  20* 22 20* 18* 18* 17*  GLUCOSE 116* 114* 193* 76 96 83 105* 92  BUN 63* 54* 58* 58* 71* 87* 95* 104*  CREATININE 4.95* 5.10* 4.57* 4.84* 6.69* 9.23* 9.83* 10.53*  CALCIUM 10.5*  --  9.8 9.4 9.2 9.0 9.0 9.3  PHOS  --   --   --   --   --   --  5.1*  --    CBC Recent Labs  Lab 06/08/18 1514  06/09/18 0454 06/09/18 1459 06/10/18 0320 06/11/18 0439 06/11/18 1513 06/12/18 0511  WBC 8.3  --  9.1 13.6* 12.2* 10.8*  --  9.2  NEUTROABS 6.8  --  7.6  --   --   --   --   --   HGB 12.7*   < > 11.3* 9.5* 8.1* 7.1* 7.0* 6.7*  HCT 39.9   < > 34.3* 30.0* 25.3* 21.7* 21.2* 21.1*  MCV 87.9  --  84.3 86.2 85.8 84.8  --  85.1  PLT 153  --  149* 148* 131* 119*  --  126*   < > = values  in this interval not displayed.    @IMGRELPRIORS @ Medications:    .  stroke: mapping our early stages of recovery book   Does not apply Once  . sodium chloride   Intravenous Once  . acetaminophen  650 mg Oral Once  . allopurinol  100 mg Oral Daily  . aspirin  81 mg Oral Daily  . atorvastatin  20 mg Oral q1800  . calcium carbonate  1 tablet Oral Q breakfast  . chlorhexidine  15 mL Mouth Rinse BID  . Chlorhexidine Gluconate Cloth  6 each Topical Q0600  . cholecalciferol  5,000 Units Oral Daily  . diphenhydrAMINE  25 mg Oral Once  . doxazosin  8 mg Oral Daily  . feeding supplement (ENSURE ENLIVE)  237 mL Oral BID BM  . gabapentin  300 mg Oral TID  . heparin injection (subcutaneous)  5,000 Units Subcutaneous Q8H  . mouth rinse  15 mL Mouth Rinse q12n4p  . multivitamin with minerals  1 tablet Oral Daily  . pantoprazole  40 mg Oral Daily  . ticagrelor  90 mg Oral BID     Madelon Lips MD 06/12/2018, 10:38 AM

## 2018-06-12 NOTE — Progress Notes (Signed)
P.t. Left 3w for dialysis

## 2018-06-12 NOTE — Progress Notes (Signed)
PT Cancellation Note  Patient Details Name: Jason HARDENBROOK Sr. MRN: [id number] DOB: 1945/10/24   Cancelled Treatment:    Reason Eval/Treat Not Completed: Medical issues which prohibited therapyPt's Hgb at 6.7 this AM. Will hold PT and continue to follow acutely as available and appropriate.    [place] 06/12/2018, 7:40 AM

## 2018-06-13 LAB — CBC
HCT: 25.7 % — ABNORMAL LOW (ref 39.0–52.0)
Hemoglobin: 8.1 g/dL — ABNORMAL LOW (ref 13.0–17.0)
MCH: 27.2 pg (ref 26.0–34.0)
MCHC: 31.5 g/dL (ref 30.0–36.0)
MCV: 86.2 fL (ref 80.0–100.0)
PLATELETS: 129 10*3/uL — AB (ref 150–400)
RBC: 2.98 MIL/uL — AB (ref 4.22–5.81)
RDW: 17.7 % — AB (ref 11.5–15.5)
WBC: 8.6 10*3/uL (ref 4.0–10.5)
nRBC: 0 % (ref 0.0–0.2)

## 2018-06-13 LAB — TYPE AND SCREEN
ABO/RH(D): AB POS
Antibody Screen: NEGATIVE
Unit division: 0

## 2018-06-13 LAB — BASIC METABOLIC PANEL
Anion gap: 16 — ABNORMAL HIGH (ref 5–15)
BUN: 107 mg/dL — ABNORMAL HIGH (ref 8–23)
CO2: 20 mmol/L — ABNORMAL LOW (ref 22–32)
Calcium: 9.5 mg/dL (ref 8.9–10.3)
Chloride: 97 mmol/L — ABNORMAL LOW (ref 98–111)
Creatinine, Ser: 10.47 mg/dL — ABNORMAL HIGH (ref 0.61–1.24)
GFR calc Af Amer: 5 mL/min — ABNORMAL LOW (ref >60)
GFR calc non Af Amer: 4 mL/min — ABNORMAL LOW (ref >60)
GLUCOSE: 86 mg/dL (ref 70–99)
Potassium: 5.4 mmol/L — ABNORMAL HIGH (ref 3.5–5.1)
Sodium: 133 mmol/L — ABNORMAL LOW (ref 135–145)

## 2018-06-13 LAB — PARATHYROID HORMONE, INTACT (NO CA): PTH: 161 pg/mL — ABNORMAL HIGH (ref 15–65)

## 2018-06-13 LAB — BPAM RBC
Blood Product Expiration Date: 201912232359
ISSUE DATE / TIME: 201912071709
Unit Type and Rh: 8400

## 2018-06-13 MED ORDER — DARBEPOETIN ALFA 100 MCG/0.5ML IJ SOSY: 100.0000 ug | PREFILLED_SYRINGE | INTRAMUSCULAR | Status: DC

## 2018-06-13 MED ORDER — SODIUM ZIRCONIUM CYCLOSILICATE 10 G PO PACK
10.0000 g | PACK | Freq: Every day | ORAL | Status: DC
  Administered 2018-06-14 (×2): 10 g via ORAL
  Filled 2018-06-13 (×2): qty 1

## 2018-06-13 NOTE — Progress Notes (Signed)
STROKE TEAM PROGRESS NOTE   SUBJECTIVE (INTERVAL HISTORY) The patient appears very lethargic.  There are no family members present.  The patient reports that he did not have dialysis yesterday.  Jason [name] told the patient that he would no doubt benefit from inpatient rehabilitation.  (Jason [name] has seen the patient in consultation)  OBJECTIVE Temp:  [97.2 F (36.2 C)-98.4 F (36.9 C)] 97.6 F (36.4 C) (12/08 1148) Pulse Rate:  [86-97] 95 (12/08 1148) Cardiac Rhythm: Normal sinus rhythm (12/08 0700) Resp:  [16-20] 18 (12/08 1148) BP: (133-166)/(64-88) 155/80 (12/08 1148) SpO2:  [98 %-100 %] 100 % (12/08 1148) Weight:  [69.5 kg] 69.5 kg (12/08 0344)  Recent Labs  Lab 06/08/18 1515 06/09/18 0812  GLUCAP 102* 103*   Recent Labs  Lab 06/10/18 0320 06/11/18 0439 06/11/18 1854 06/12/18 0511 06/13/18 0539  NA 135 133* 131* 133* 133*  K 4.0 4.5 4.6 5.0 5.4*  CL 101 100 97* 98 97*  CO2 20* 18* 18* 17* 20*  GLUCOSE 96 83 105* 92 86  BUN 71* 87* 95* 104* 107*  CREATININE 6.69* 9.23* 9.83* 10.53* 10.47*  CALCIUM 9.2 9.0 9.0 9.3 9.5  PHOS  --   --  5.1*  --   --    Recent Labs  Lab 06/08/18 1514 06/11/18 1854  AST 18  --   ALT 15  --   ALKPHOS 87  --   BILITOT 0.9  --   PROT 6.7  --   ALBUMIN 4.0 2.6*   Recent Labs  Lab 06/08/18 1514  06/09/18 0454 06/09/18 1459 06/10/18 0320 06/11/18 0439 06/11/18 1513 06/12/18 0511 06/13/18 0539  WBC 8.3  --  9.1 13.6* 12.2* 10.8*  --  9.2 8.6  NEUTROABS 6.8  --  7.6  --   --   --   --   --   --   HGB 12.7*   < > 11.3* 9.5* 8.1* 7.1* 7.0* 6.7* 8.1*  HCT 39.9   < > 34.3* 30.0* 25.3* 21.7* 21.2* 21.1* 25.7*  MCV 87.9  --  84.3 86.2 85.8 84.8  --  85.1 86.2  PLT 153  --  149* 148* 131* 119*  --  126* 129*   < > = values in this interval not displayed.   No results for input(s): CKTOTAL, CKMB, CKMBINDEX, TROPONINI in the last 168 hours. No results for input(s): LABPROT, INR in the last 72 hours. No results for input(s):  COLORURINE, LABSPEC, [place], GLUCOSEU, HGBUR, BILIRUBINUR, KETONESUR, PROTEINUR, UROBILINOGEN, NITRITE, LEUKOCYTESUR in the last 72 hours.  Invalid input(s): APPERANCEUR     Component Value Date/Time   CHOL 171 06/09/2018 0454   TRIG 101 06/09/2018 0454   TRIG 104 05/11/2006 1014   HDL 48 06/09/2018 0454   CHOLHDL 3.6 06/09/2018 0454   VLDL 20 06/09/2018 0454   LDLCALC 103 (H) 06/09/2018 0454   Lab Results  Component Value Date   HGBA1C 5.5 06/09/2018      Component Value Date/Time   LABOPIA NONE DETECTED 06/09/2018 0418   COCAINSCRNUR NONE DETECTED 06/09/2018 0418   LABBENZ NONE DETECTED 06/09/2018 0418   AMPHETMU NONE DETECTED 06/09/2018 0418   THCU NONE DETECTED 06/09/2018 0418   LABBARB NONE DETECTED 06/09/2018 0418    Recent Labs  Lab 06/08/18 1514  ETH <10    IMAGING  Ct Angio Head W Or Wo Contrast Ct Cerebral Perfusion W Contrast Ct Angio Neck W Or Wo Contrast 06/08/2018 IMPRESSION:  1. Positive for LVO (occluded Right  ICA, Right ICA terminus and Right MCA) with CT Perfusion criteria favorable for endovascular reperfusion (core infarct estimated at 13 mL with mismatch volume 119 mL, mismatch ratio of 10.2). 2. Positive also for occluded Right Vertebral Artery and soft plaque or thrombus at the Left Vertebral Artery origin. In conjunction with #1 this might reflect sequelae of cardiac or paradoxical emboli. This was discussed with Dr. Juliet [name] in [place] at 1359 hours.  3. Superimposed intracranial atherosclerosis including moderate or severe stenosis of the: - Left ICA supraclinoid segment. - Left vertebral artery V4 segment. - Right PCA P1/P2.  4. Stable visible upper chest since the CT on 12/01/2017.    Ct Head Code Stroke Wo Contrast 06/08/2018 IMPRESSION:  1. Hyperdense Right MCA M1 segment compatible with ELVO. Subtle cytotoxic edema suspected in the right insula, ASPECTS is 9.  2. Negative for bleed.   Dg Chest Port 1 View 06/10/2018 IMPRESSION:   Endotracheal tube removed.   Resolution of CHF   Transthoracic Echocardiogram  06/09/2018 Study Conclusions - Left ventricle: The cavity size was mildly dilated. Systolic   function was severely reduced. The estimated ejection fraction   was in the range of 20% to 25%. Diffuse hypokinesis. - Left atrium: The atrium was mildly dilated. - Right atrium: The atrium was mildly dilated. - Atrial septum: No defect or patent foramen ovale was identified. Impressions: - Findings consistent with dilated cardiomyopathy. Consider TEE if   clinically indicated. No obvious source of cerebral embolism.    Bilateral LE Venous Dopplers  06/09/2018 Summary: Right: There is no evidence of deep vein thrombosis in the lower extremity. However, portions of this examination were limited- see technologist comments above. No cystic structure found in the popliteal fossa. Left: There is no evidence of deep vein thrombosis in the lower extremity. However, portions of this examination were limited- see technologist comments above. No cystic structure found in the popliteal fossa.    PHYSICAL EXAM  Temp:  [97.2 F (36.2 C)-98.4 F (36.9 C)] 97.6 F (36.4 C) (12/08 1148) Pulse Rate:  [86-97] 95 (12/08 1148) Resp:  [16-20] 18 (12/08 1148) BP: (133-166)/(64-88) 155/80 (12/08 1148) SpO2:  [98 %-100 %] 100 % (12/08 1148) Weight:  [69.5 kg] 69.5 kg (12/08 0344)   pleasant obese elderly African-American male not in distress. He has a dialysis fistula in the left forearm.  . Afebrile. Head is nontraumatic. Neck is supple without bruit.    Cardiac exam no murmur or gallop. Lungs are clear to auscultation. Distal pulses are well felt. Neuro - awake alert, orientated x 3, answer questions appropriately, following all simple commands. PERRL, EOMI, no gaze preference, visual field full. Left facial droop, moderate dysarthria, tongue midline. RUE and RLE 5/5, LUE proximal 3+/5 and distal 4/5 and LLE 4/5 proximal and  distally. Sensation symmetrical. Coordination ataxia on the right FTN proportional to the weakness, and gait not tested.    ASSESSMENT/PLAN [name] is a 72 y.o. male with history of CKD, COPD, anemia, HTN, obesity, OSA on CPAP, rectal cancer admitted for slurry speech, left sided weakness and found down at home. No tPA given due to outside window.    Stroke:  right MCA infarct due to right ICA and MCA occlusion s/p IR and rescue stents with TICI2b reperfusion, most likely due to large vessel athero, but cardioembolic still in DDx  Resultant intubated with left hemiparesis  CT right MCA hyperdense sign  CTA head and neck - right ICA and MCA occlusion,  right VA high grade stenosis vs. Occlusion, left siphon, right P1/P2, and left VA high grade stenosis  MRI  Right MCA patchy infarcts  MRA patent right ICA, right ACA and proximal right MCA.  Decreased right MCA branches, left ICA siphon and right P2 severe stenosis.  Occluded right VA.  DSA - right ICA proximal occlusion s/p stenting with recannulization and right MCA occlusion s/p TICI 2b reperfusion with stenting. B/l VA severe stenosis origins.   2D Echo - EF 20 - 25%. Findings consistent with dilated cardiomyopathy. Consider TEE if    clinically indicated. No obvious source of cerebral embolism.  LE venous doppler no DVT  Recommend 30 day cardiac event monitoring as outpt to rule out afib  LDL 103  HgbA1c 5.5  Heparin subq for VTE prophylaxis  No antithrombotic prior to admission, now on aspirin 81 mg daily and brilinta due to intracranial stents.  Ongoing aggressive stroke risk factor management  Therapy recommendations:  CIR (Dr Naaman [name] has seen patient)  Disposition:  Pending   CKD stage V s/p IV contrast  Cre 5.10->4.57->4.84->6.69->9.23  Has AVF placed recently, following with Dr. Posey Pronto  No HD yet PTA  Likely related to recent IV contrast load - difficult procedure  Encourage p.o. intake  No  hyperkalemia  CXR no pleural effusion or fluid overload  Avoid nephrotoxic meds  Nephrology Dr. Hollie Salk consulted, appreciate help  Anemia   due to CKD and blood loss of groin hematoma  Groin hematoma stable now  Hb 12.7->11.3->9.5->8.1->7.1->6.7->8.1 (tx'd in dialysis) Recheck in AM  Consider transfusion if Hb < 7.0  Hx of rectal cancer  Hypertension . BP stable, on the low side . BP goal < 180 . D/c metoprolol for now  Long term BP goal normotensive  Hyperlipidemia  Home meds:  none   LDL 103, goal < 70  On lipitor 20  Continue lipitor on discharge  Other Stroke Risk Factors  Advanced age  Former Cigarette smoker, now quit  ETOH use in the past  Obstructive sleep apnea, on CPAP at home  Other Active Problems  COPD  Rectal cancer history  Leukocytosis WBC 13.6-> 12.2->10.8->9.2->8.6  ESRD - appreciate Dr Bishop Dublin assistance  [facility] day # [place] PA-C [facility] Pager [phone] 06/13/2018, 2:34 PM I have personally obtained history,examined this patient, reviewed notes, independently viewed imaging studies, participated in medical decision making and plan of care.ROS completed by me personally and pertinent positives fully documented  I have made any additions or clarifications directly to the above note. Agree with note above. He received  Packed cells transfusion y`dayAwait transfer to rehab when bed available  Antony Contras, MD Medical Director [place] Pager: [phone] 06/13/2018 4:02 PM       To contact Stroke Continuity provider, please refer to [email or web address] After hours, contact General Neurology

## 2018-06-13 NOTE — Progress Notes (Signed)
[facility] Progress Note   Assessment/ Plan:   1. Acute stroke- s/p IR clot retrieval, had some bleeding from sheath site earlier in hospitalization.  Per primary.  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  Greatly appreciate assistance. 3 Hypertension: permissive HTN 4. Anemia of ESRD: will dose ESA as appropriate- hgb 6.7 today, has been drifting down, pt denies overt bleeding, s/p 1 u pRBCs, aranesp  5. Metabolic Bone Disease: will dose calcitriol as appropriate, check PTH 6.  Dispo: ? CIR  Subjective:    Went for first HD yesterday afternoon, was unable to be cannulated, infiltrated.  Fistula placed 03/2016 by Dr. Oneida Alar.  Sleeping, had gotten up earlier per wife and now resting   Objective:   BP (!) 156/71 (BP Location: Right Arm)   Pulse 94   Temp 98.3 F (36.8 C) (Oral)   Resp 17   Ht 5\' 8"  (1.727 m)   Wt 69.5 kg   SpO2 99%   BMI 23.30 kg/m   Physical Exam: GEN: sitting up, wife at bedside HEENT muddy sclerae NECK no JVD PULM clear anteriorly CV RRR ABD soft EXT no LE edema NEURO L=sided weakness ACCESS: LUE AVF + T/B, pulsatile + tortuous Labs: BMET Recent Labs  Lab 06/08/18 2156 06/09/18 0454 06/10/18 0320 06/11/18 0439 06/11/18 1854 06/12/18 0511 06/13/18 0539  NA 134* 138 135 133* 131* 133* 133*  K 3.9 3.8 4.0 4.5 4.6 5.0 5.4*  CL 101 105 101 100 97* 98 97*  CO2 20* 22 20* 18* 18* 17* 20*  GLUCOSE 193* 76 96 83 105* 92 86  BUN 58* 58* 71* 87* 95* 104* 107*  CREATININE 4.57* 4.84* 6.69* 9.23* 9.83* 10.53* 10.47*  CALCIUM 9.8 9.4 9.2 9.0 9.0 9.3 9.5  PHOS  --   --   --   --  5.1*  --   --    CBC Recent Labs  Lab 06/08/18 1514  06/09/18 0454  06/10/18 0320 06/11/18 0439 06/11/18 1513 06/12/18 0511 06/13/18 0539  WBC 8.3  --  9.1   < > 12.2* 10.8*  --  9.2 8.6  NEUTROABS 6.8  --  7.6  --   --   --   --   --   --    HGB 12.7*   < > 11.3*   < > 8.1* 7.1* 7.0* 6.7* 8.1*  HCT 39.9   < > 34.3*   < > 25.3* 21.7* 21.2* 21.1* 25.7*  MCV 87.9  --  84.3   < > 85.8 84.8  --  85.1 86.2  PLT 153  --  149*   < > 131* 119*  --  126* 129*   < > = values in this interval not displayed.    @IMGRELPRIORS @ Medications:    .  stroke: mapping our early stages of recovery book   Does not apply Once  . allopurinol  100 mg Oral Daily  . aspirin  81 mg Oral Daily  . atorvastatin  20 mg Oral q1800  . calcium carbonate  1 tablet Oral Q breakfast  . chlorhexidine  15 mL Mouth Rinse BID  . Chlorhexidine Gluconate Cloth  6 each Topical Q0600  . cholecalciferol  5,000 Units Oral Daily  . darbepoetin (ARANESP) injection - DIALYSIS  100 mcg Intravenous Q Sat-HD  . doxazosin  8 mg Oral Daily  . feeding supplement (ENSURE ENLIVE)  237 mL Oral BID BM  . gabapentin  300 mg Oral TID  . heparin injection (subcutaneous)  5,000 Units Subcutaneous Q8H  . mouth rinse  15 mL Mouth Rinse q12n4p  . multivitamin  1 tablet Oral QHS  . pantoprazole  40 mg Oral Daily  . ticagrelor  90 mg Oral BID     Madelon Lips MD 06/13/2018, 11:26 AM

## 2018-06-13 NOTE — Plan of Care (Signed)
  Problem: Education: Goal: Knowledge of disease or condition will improve Outcome: Progressing Goal: Knowledge of patient specific risk factors addressed and post discharge goals established will improve Outcome: Progressing   Problem: Self-Care: Goal: Verbalization of feelings and concerns over difficulty with self-care will improve Outcome: Progressing   Problem: Nutrition: Goal: Risk of aspiration will decrease Outcome: Progressing   Problem: Education: Goal: Knowledge of General Education information will improve Description Including pain rating scale, medication(s)/side effects and non-pharmacologic comfort measures Outcome: Progressing

## 2018-06-14 ENCOUNTER — Inpatient Hospital Stay (HOSPITAL_COMMUNITY)
Admission: EM | Disposition: A | Discharge status: Rehabilitation Facility | Payer: Self-pay | Source: Home / Self Care | Attending: Neurology

## 2018-06-14 DIAGNOSIS — N185 Chronic kidney disease, stage 5: Secondary | ICD-10-CM

## 2018-06-14 DIAGNOSIS — T82898A Other specified complication of vascular prosthetic devices, implants and grafts, initial encounter: Secondary | ICD-10-CM

## 2018-06-14 HISTORY — PX: PERIPHERAL VASCULAR INTERVENTION: CATH118257

## 2018-06-14 HISTORY — PX: A/V FISTULAGRAM: CATH118298

## 2018-06-14 LAB — BASIC METABOLIC PANEL
Anion gap: 14 (ref 5–15)
BUN: 114 mg/dL — ABNORMAL HIGH (ref 8–23)
CO2: 21 mmol/L — ABNORMAL LOW (ref 22–32)
Calcium: 9.5 mg/dL (ref 8.9–10.3)
Chloride: 98 mmol/L (ref 98–111)
Creatinine, Ser: 10.16 mg/dL — ABNORMAL HIGH (ref 0.61–1.24)
GFR calc Af Amer: 5 mL/min — ABNORMAL LOW (ref >60)
GFR calc non Af Amer: 5 mL/min — ABNORMAL LOW (ref >60)
Glucose, Bld: 90 mg/dL (ref 70–99)
Potassium: 5.9 mmol/L — ABNORMAL HIGH (ref 3.5–5.1)
Sodium: 133 mmol/L — ABNORMAL LOW (ref 135–145)

## 2018-06-14 LAB — CBC
HCT: 23.7 % — ABNORMAL LOW (ref 39.0–52.0)
Hemoglobin: 7.7 g/dL — ABNORMAL LOW (ref 13.0–17.0)
MCH: 27.5 pg (ref 26.0–34.0)
MCHC: 32.5 g/dL (ref 30.0–36.0)
MCV: 84.6 fL (ref 80.0–100.0)
Platelets: 146 10*3/uL — ABNORMAL LOW (ref 150–400)
RBC: 2.8 MIL/uL — ABNORMAL LOW (ref 4.22–5.81)
RDW: 17.5 % — ABNORMAL HIGH (ref 11.5–15.5)
WBC: 9.5 10*3/uL (ref 4.0–10.5)
nRBC: 0 % (ref 0.0–0.2)

## 2018-06-14 SURGERY — A/V FISTULAGRAM
Anesthesia: LOCAL | Laterality: Left

## 2018-06-14 MED ORDER — CALCITRIOL 0.25 MCG PO CAPS
0.2500 ug | ORAL_CAPSULE | Freq: Every day | ORAL | Status: DC
  Administered 2018-06-14 – 2018-06-16 (×3): 0.25 ug via ORAL
  Filled 2018-06-14 (×3): qty 1

## 2018-06-14 MED ORDER — FENTANYL CITRATE (PF) 100 MCG/2ML IJ SOLN
INTRAMUSCULAR | Status: DC | PRN
  Administered 2018-06-14 (×2): 50 ug via INTRAVENOUS

## 2018-06-14 MED ORDER — SODIUM ZIRCONIUM CYCLOSILICATE 10 G PO PACK
10.0000 g | PACK | Freq: Three times a day (TID) | ORAL | Status: AC
  Administered 2018-06-14 (×2): 10 g via ORAL
  Filled 2018-06-14 (×3): qty 1

## 2018-06-14 MED ORDER — HEPARIN (PORCINE) IN NACL 1000-0.9 UT/500ML-% IV SOLN
INTRAVENOUS | Status: AC
  Filled 2018-06-14: qty 500

## 2018-06-14 MED ORDER — LIDOCAINE HCL (PF) 1 % IJ SOLN
INTRAMUSCULAR | Status: DC | PRN
  Administered 2018-06-14: 10 mL

## 2018-06-14 MED ORDER — HEPARIN (PORCINE) IN NACL 1000-0.9 UT/500ML-% IV SOLN
INTRAVENOUS | Status: DC | PRN
  Administered 2018-06-14: 500 mL

## 2018-06-14 MED ORDER — MIDAZOLAM HCL 2 MG/2ML IJ SOLN
INTRAMUSCULAR | Status: AC
  Filled 2018-06-14: qty 2

## 2018-06-14 MED ORDER — FENTANYL CITRATE (PF) 100 MCG/2ML IJ SOLN
INTRAMUSCULAR | Status: AC
  Filled 2018-06-14: qty 2

## 2018-06-14 MED ORDER — IODIXANOL 320 MG/ML IV SOLN
INTRAVENOUS | Status: DC | PRN
  Administered 2018-06-14: 80 mL

## 2018-06-14 SURGICAL SUPPLY — 20 items
BAG SNAP BAND KOVER 36X36 (MISCELLANEOUS) ×2 IMPLANT
BALLN MUSTANG 10X20X75 (BALLOONS) ×2
BALLN MUSTANG 8.0X40 75 (BALLOONS) ×2
BALLOON MUSTANG 10X20X75 (BALLOONS) IMPLANT
BALLOON MUSTANG 8.0X40 75 (BALLOONS) IMPLANT
CATH ANGIO 5F BER2 65CM (CATHETERS) ×1 IMPLANT
COVER DOME SNAP 22 D (MISCELLANEOUS) ×2 IMPLANT
KIT ENCORE 26 ADVANTAGE (KITS) ×1 IMPLANT
KIT MICROPUNCTURE NIT STIFF (SHEATH) ×1 IMPLANT
PROTECTION STATION PRESSURIZED (MISCELLANEOUS) ×2
SHEATH PINNACLE R/O II 7F 4CM (SHEATH) ×1 IMPLANT
SHEATH PROBE COVER 6X72 (BAG) ×1 IMPLANT
STATION PROTECTION PRESSURIZED (MISCELLANEOUS) ×1 IMPLANT
STENT VIABAHN 8X50X120 (Permanent Stent) ×1 IMPLANT
STENT VIABAHN5X120X8X (Permanent Stent) ×1 IMPLANT
STOPCOCK MORSE 400PSI 3WAY (MISCELLANEOUS) ×2 IMPLANT
TRAY PV CATH (CUSTOM PROCEDURE TRAY) ×2 IMPLANT
TUBING CIL FLEX 10 FLL-RA (TUBING) ×2 IMPLANT
WIRE BENTSON .035X145CM (WIRE) ×1 IMPLANT
WIRE G V18X300CM (WIRE) ×1 IMPLANT

## 2018-06-14 NOTE — Consult Note (Addendum)
CONSULT NOTE   MRN : [id number]  Reason for Consult: malfunctioning fistula left [facility] Referring Physician: Dr. Hollie Salk  History of Present Illness: 72 y/o male who has a fistula left BC  that was created  in 2017 by Dr. Oneida Alar.  They had difficulty cannulating the fistula 06/13/2018 and we have been asked to examine the fistula.    Past medical hx includes: recent elevation in Cr 9.23 trial initiation of HD this admission.    CKD V, HTN. HLD, gout, rectal cancer who presented to [facility] ED 06/08/2018 with a code stroke after his wife found him down in the bathtub.  HE received IV contrast for CTA.  He was taken to IR for clot retrieval.  He has L-sided weakness.       Current Facility-Administered Medications  Medication Dose Route Frequency Provider Last Rate Last Dose  .  stroke: mapping our early stages of recovery book   Does not apply Once Amie Portland, MD      . 0.9 %  sodium chloride infusion  100 mL Intravenous PRN Madelon Lips, MD      . 0.9 %  sodium chloride infusion  100 mL Intravenous PRN Madelon Lips, MD      . acetaminophen (TYLENOL) tablet 650 mg  650 mg Oral Q4H PRN Luanne Bras, MD       Or  . acetaminophen (TYLENOL) solution 650 mg  650 mg Per Tube Q4H PRN Deveshwar, Willaim Rayas, MD       Or  . acetaminophen (TYLENOL) suppository 650 mg  650 mg Rectal Q4H PRN Deveshwar, Willaim Rayas, MD      . allopurinol (ZYLOPRIM) tablet 100 mg  100 mg Oral Daily Rosalin Hawking, MD   100 mg at 06/14/18 0842  . alteplase (CATHFLO ACTIVASE) injection 2 mg  2 mg Intracatheter Once PRN Madelon Lips, MD      . aspirin chewable tablet 81 mg  81 mg Oral Daily Luanne Bras, MD   81 mg at 06/14/18 0843  . atorvastatin (LIPITOR) tablet 20 mg  20 mg Oral q1800 Rosalin Hawking, MD   20 mg at 06/13/18 1706  . calcium carbonate (OS-CAL - dosed in mg of elemental calcium) tablet 500 mg of elemental calcium  1 tablet Oral Q breakfast Rosalin Hawking, MD   500 mg of elemental calcium at 06/14/18 0842   . chlorhexidine (PERIDEX) 0.12 % solution 15 mL  15 mL Mouth Rinse BID Rosalin Hawking, MD   15 mL at 06/14/18 0842  . Chlorhexidine Gluconate Cloth 2 % PADS 6 each  6 each Topical Q0600 Madelon Lips, MD   6 each at 06/14/18 0454  . cholecalciferol (VITAMIN D3) tablet 5,000 Units  5,000 Units Oral Daily Rosalin Hawking, MD   5,000 Units at 06/14/18 [phone]  . Darbepoetin Alfa (ARANESP) injection 100 mcg  100 mcg Intravenous Q Mon-HD Madelon Lips, MD      . doxazosin (CARDURA) tablet 8 mg  8 mg Oral Daily Rosalin Hawking, MD   8 mg at 06/13/18 0940  . feeding supplement (ENSURE ENLIVE) (ENSURE ENLIVE) liquid 237 mL  237 mL Oral BID BM Rosalin Hawking, MD   237 mL at 06/13/18 1508  . gabapentin (NEURONTIN) capsule 300 mg  300 mg Oral TID Rosalin Hawking, MD   300 mg at 06/14/18 0844  . heparin injection 1,000 Units  1,000 Units Dialysis PRN Madelon Lips, MD      . heparin injection 5,000 Units  5,000 Units Subcutaneous Q8H Erlinda Hong,  Jindong, MD   5,000 Units at 06/14/18 0455  . lidocaine (PF) (XYLOCAINE) 1 % injection 5 mL  5 mL Intradermal PRN Madelon Lips, MD      . lidocaine-prilocaine (EMLA) cream 1 application  1 application Topical PRN Madelon Lips, MD      . MEDLINE mouth rinse  15 mL Mouth Rinse q12n4p Rosalin Hawking, MD   15 mL at 06/13/18 1647  . multivitamin (RENA-VIT) tablet 1 tablet  1 tablet Oral QHS [facility]   1 tablet at 06/13/18 2055  . pantoprazole (PROTONIX) EC tablet 40 mg  40 mg Oral Daily Rosalin Hawking, MD   40 mg at 06/14/18 0842  . pentafluoroprop-tetrafluoroeth (GEBAUERS) aerosol 1 application  1 application Topical PRN Madelon Lips, MD      . senna-docusate (Senokot-S) tablet 1 tablet  1 tablet Oral QHS PRN Amie Portland, MD      . sodium zirconium cyclosilicate (LOKELMA) packet 10 g  10 g Oral Daily Madelon Lips, MD   10 g at 06/14/18 7902  . ticagrelor (BRILINTA) tablet 90 mg  90 mg Oral BID Luanne Bras, MD   90 mg at 06/14/18 0843  . traMADol (ULTRAM)  tablet 50 mg  50 mg Oral Q12H PRN Rosalin Hawking, MD   50 mg at 06/14/18 0844    Pt meds include: Statin :Yes Betablocker: Yes ASA: Yes Other anticoagulants/antiplatelets: Bralinta   Past Medical History:  Diagnosis Date  . Anemia   . Arthritis   . Chronic renal insufficiency 07/31/2011  . COPD (chronic obstructive pulmonary disease) ([place])    pt denies  . Deficiency anemia 07/20/2012  . Diverticulosis   . Edema 09/21/2010   Qualifier: Diagnosis of  By: Jerold Coombe    . ERECTILE DYSFUNCTION, MILD 05/11/2007   Qualifier: Diagnosis of  By: Sherren Mocha MD, Jory Ee   . Exertional dyspnea 10/30/2015  . GOUT 05/11/2007   Qualifier: Diagnosis of  By: Sherren Mocha MD, Jory Ee   . Hereditary and idiopathic peripheral neuropathy 09/14/2014  . History of radiation therapy 02/21/13-03/31/13   rectum 50.4Gy total dose  . Hypersomnia 10/30/2015  . Hypertension   . Lung nodule 10/30/2015  . OAB (overactive bladder) 01/09/2016  . Obesity (BMI 30.0-34.9) 10/30/2015  . Obstructive sleep apnea 07/29/2010   HST 11/2015 AHI 54.  On autocpap  >> 10 cm   . Peripheral edema 09/19/2013  . rectal ca dx'd 02/01/13   rectal. Radiation and chemotherapy- remains on chemotherapy-next tx. 08-22-13.   . S/P partial lobectomy of lung 11/20/2015  . S/P thoracotomy   . SUI (stress urinary incontinence), male 01/09/2016    Past Surgical History:  Procedure Laterality Date  . AV FISTULA PLACEMENT Left 03/24/2016   Procedure: CREATION OF LEFT BRACIOCEPHALIC ARTERIOVENOUS (AV) FISTULA;  Surgeon: Elam Dutch, MD;  Location: [facility];  Service: Vascular;  Laterality: Left;  . COLON SURGERY  05/20/2013  . COLONOSCOPY  2010   [facility]  . EUS N/A 02/03/2013   Procedure: LOWER ENDOSCOPIC ULTRASOUND (EUS);  Surgeon: Milus Banister, MD;  Location: [facility];  Service: Endoscopy;  Laterality: N/A;  . ILEOSTOMY CLOSURE N/A 08/24/2013   Procedure: CLOSURE OF LOOP ILEOSTOMY ;  Surgeon: Leighton Ruff, MD;  Location: [facility];  Service: General;   Laterality: N/A;  . IR ANGIO VERTEBRAL SEL SUBCLAVIAN INNOMINATE BILAT MOD SED  06/08/2018  . IR CT HEAD LTD  06/08/2018  . IR INTRAVSC STENT CERV CAROTID W/O EMB-PROT MOD SED INC ANGIO  06/08/2018  . IR PERCUTANEOUS ART THROMBECTOMY/INFUSION INTRACRANIAL INC DIAG ANGIO  06/08/2018  . LAPAROSCOPIC LOW ANTERIOR RESECTION N/A 05/20/2013   Procedure: LAPAROSCOPIC LOW ANTERIOR RESECTION WITH SPLENIC FLEXURE MOBILIZATION;  Surgeon: Leighton Ruff, MD;  Location: [facility];  Service: General;  Laterality: N/A;  . OSTOMY N/A 05/20/2013   Procedure: diverting OSTOMY;  Surgeon: Leighton Ruff, MD;  Location: [facility];  Service: General;  Laterality: N/A;  . RADIOLOGY WITH ANESTHESIA N/A 06/08/2018   Procedure: IR WITH ANESTHESIA;  Surgeon: Radiologist, Medication, MD;  Location: [place] [facility];  Service: Radiology;  Laterality: N/A;  . VIDEO ASSISTED THORACOSCOPY (VATS)/WEDGE RESECTION Left 11/20/2015   Procedure: VIDEO ASSISTED THORACOSCOPY (VATS)/LUNG RESECTION;  Surgeon: [name], MD;  Location: [facility];  Service: Thoracic;  Laterality: Left;  Marland Kitchen VIDEO BRONCHOSCOPY N/A 11/20/2015   Procedure: VIDEO BRONCHOSCOPY;  Surgeon: [name], MD;  Location: [facility];  Service: Thoracic;  Laterality: N/A;    Social History Social History   Tobacco Use  . Smoking status: Former Smoker    Packs/day: 0.50    Years: 10.00    Pack years: 5.00    Types: Cigarettes    Last attempt to quit: 07/07/1977    Years since quitting: 40.9  . Smokeless tobacco: Never Used  Substance Use Topics  . Alcohol use: Yes    Alcohol/week: 0.0 standard drinks    Comment: drinks occasionally  . Drug use: No    Family History Family History  Problem Relation Age of Onset  . Stroke Mother   . Hypertension Mother   . Stroke Father   . Hypertension Father   . Hypertension Other   . Colon cancer Neg Hx     Allergies  Allergen Reactions  . Nsaids Other (See Comments)    Asked by surgeon to add this medication class as intolerance  due to patients renal insufficiency.  . Amlodipine Other (See Comments)    Edema      REVIEW OF SYSTEMS  General: [ ]  Weight loss, [ ]  Fever, [ ]  chills Neurologic: [ ]  Dizziness, [ ]  Blackouts, [ ]  Seizure [x ] Stroke, [ ]  "Mini stroke", [ ]  Slurred speech, [ ]  Temporary blindness; [ ]  weakness in arms or legs, [ ]  Hoarseness [ ]  Dysphagia Cardiac: [ ]  Chest pain/pressure, [ ]  Shortness of breath at rest [ ]  Shortness of breath with exertion, [ ]  Atrial fibrillation or irregular heartbeat  Vascular: [ ]  Pain in legs with walking, [ ]  Pain in legs at rest, [ ]  Pain in legs at night,  [ ]  Non-healing ulcer, [ ]  Blood clot in vein/DVT,   Pulmonary: [ ]  Home oxygen, [ ]  Productive cough, [ ]  Coughing up blood, [ ]  Asthma,  [ ]  Wheezing [ ]  COPD Musculoskeletal:  [ ]  Arthritis, [ ]  Low back pain, [ ]  Joint pain Hematologic: [ ]  Easy Bruising, [x ] Anemia; [ ]  Hepatitis Gastrointestinal: [ ]  Blood in stool, [ ]  Gastroesophageal Reflux/heartburn, Urinary: [x ] chronic Kidney disease, [ ]  on HD - [ ]  MWF or [ ]  TTHS, [ ]  Burning with urination, [ ]  Difficulty urinating Skin: [ ]  Rashes, [ ]  Wounds Psychological: [ ]  Anxiety, [ ]  Depression  Physical Examination Vitals:   06/13/18 1933 06/13/18 2339 06/14/18 0329 06/14/18 0736  BP: (!) 150/80 (!) 156/79 (!) 154/84 (!) 151/76  Pulse: (!) 104 (!) 102 (!) 105 99  Resp: 20 20 20 18   Temp: 98.7 F (37.1 C) 98.9 F (  37.2 C) 98.8 F (37.1 C)   TempSrc: Oral Oral Oral   SpO2: 100% 100% 100% 100%  Weight:      Height:       Body mass index is 23.3 kg/m.  General:  WDWN in NAD Gait: Normal HENT: WNL Eyes: Pupils equal Pulmonary: normal non-labored breathing , without Rales, rhonchi,  wheezing Cardiac: RRR, without  Murmurs, rubs or gallops; No carotid bruits Abdomen: soft, NT, no masses Skin: no rashes, ulcers noted;  no Gangrene , no cellulitis; no open wounds;   Vascular Exam/Pulses:Palpable radial B UE, palpable left fistula with  pulsatile thrill throughout, palpable DP pulses B LE   Musculoskeletal: no muscle wasting or atrophy; no edema  Neurologic: A&O X 3; Appropriate Affect ;  SENSATION: normal; MOTOR FUNCTION: 5/5 Symmetric recovery post stroke Speech is fluent/normal   Significant Diagnostic Studies: CBC Lab Results  Component Value Date   WBC 9.5 06/14/2018   HGB 7.7 (L) 06/14/2018   HCT 23.7 (L) 06/14/2018   MCV 84.6 06/14/2018   PLT 146 (L) 06/14/2018    BMET    Component Value Date/Time   NA 133 (L) 06/14/2018 0427   NA 140 02/24/2017   NA 138 02/20/2014 1432   K 5.9 (H) 06/14/2018 0427   K 3.5 02/20/2014 1432   CL 98 06/14/2018 0427   CL 101 12/09/2012 1048   CO2 21 (L) 06/14/2018 0427   CO2 30 (H) 02/20/2014 1432   GLUCOSE 90 06/14/2018 0427   GLUCOSE 93 02/20/2014 1432   GLUCOSE 96 12/09/2012 1048   BUN 114 (H) 06/14/2018 0427   BUN 50 (A) 08/10/2017   BUN 36.0 (H) 02/20/2014 1432   CREATININE 10.16 (H) 06/14/2018 0427   CREATININE 2.4 (H) 02/20/2014 1432   CALCIUM 9.5 06/14/2018 0427   CALCIUM 10.4 02/20/2014 1432   GFRNONAA 5 (L) 06/14/2018 0427   GFRAA 5 (L) 06/14/2018 0427   Estimated Creatinine Clearance: 6.5 mL/min (A) (by C-G formula based on SCr of 10.16 mg/dL (H)).  COAG Lab Results  Component Value Date   INR 1.06 06/08/2018   INR 1.11 11/16/2015   INR 1.16 05/26/2013       ASSESSMENT/PLAN:  AKI on CKD with malfunctioning left BC fistula s/p attempted cannulation.   The fistula has a pulsatile thrill.  We will schedule him for fistulogram  And possible intervention.  If the fistula can not be salvaged he may need a TDC.      Roxy Horseman 06/14/2018 9:51 AM   I have independently interviewed and examined patient and agree with PA assessment and plan above.  Fistula is pulsatile all the way to the cephalic arch.  We will plan for fistulogram today with possible intervention and if we cannot improve flow he will need tunneled dialysis  catheter.  Brandon C. Donzetta Matters, MD [facility] [place] Office: [phone] Pager: [phone]

## 2018-06-14 NOTE — Progress Notes (Signed)
Physical Therapy Treatment Patient Details Name: Jason [name] Sr. MRN: [id number] DOB: [birth year] Today's Date: 06/14/2018    History of Present Illness 72 year old male with rectal cancer status post chemotherapy and radiation, CKD stage V, OSA who presented with slurred speech and left-sided weakness and found with right MCA infarct secondary to right ICA and MCA occlusion status post stent and reperfusion.      PT Comments    Pt performed gait training and functional mobility.  He required moderate assistance for gait and transfers and remains to present with deficits in balance and coordination.  Plan next session for standing exercises to further challenge balance.  Pt righting response is delayed.  I have discussed the patient's current level of function related to strength, coordination and balance  with the patient and his wife.  They acknowledge understanding of this and do not feel the patient would be able to have their care needs met at home.  They are interested in post-acute rehab in an inpatient setting.      Follow Up Recommendations  CIR;Supervision/Assistance - 24 hour     Equipment Recommendations  Other (comment)(TBA)    Recommendations for Other Services Rehab consult     Precautions / Restrictions Precautions Precautions: Fall Precaution Comments: L inattention Restrictions Weight Bearing Restrictions: No    Mobility  Bed Mobility Overal bed mobility: Needs Assistance Bed Mobility: Supine to Sit;Rolling Rolling: Min assist   Supine to sit: Min assist     General bed mobility comments: Cues for hand placement to progress to edge of bed.  Min assistance to elevate trunk into sitting.  Once in sitting able to scoot to edge of bed unassisted with max VCs.  Pt returned to bed unassisted with increased time and effort.    Transfers Overall transfer level: Needs assistance Equipment used: Rolling walker (2 wheeled) Transfers: Sit to/from Stand Sit to  Stand: Mod assist Stand pivot transfers: Mod assist       General transfer comment: Cues for hand placement as he attempts to pull into standing with RW.  Pt is slow and guarded with LOB posterior and lateral. Performed multiple transfers during session  To pivot to commde he remains to require mod assistance and cues to reach back for seated surface.    Ambulation/Gait Ambulation/Gait assistance: Mod assist Gait Distance (Feet): 50 Feet Assistive device: Rolling walker (2 wheeled) Gait Pattern/deviations: Step-to pattern;Decreased step length - left;Decreased stride length;Decreased weight shift to right;Decreased stance time - right;Ataxic;Leaning posteriorly;Staggering left;Trunk flexed     General Gait Details: Pt more drowsy this pm, He performed shorter gait distance with cues/assist to correct posture and position in RW.  Pt required max VCs to maintain RW position with turning and backing.     Stairs             Wheelchair Mobility    Modified Rankin (Stroke Patients Only) Modified Rankin (Stroke Patients Only) Pre-Morbid Rankin Score: No symptoms Modified Rankin: Moderately severe disability     Balance Overall balance assessment: Needs assistance Sitting-balance support: Bilateral upper extremity supported;Feet supported Sitting balance-Leahy Scale: Poor Sitting balance - Comments: Pt able to sit EOB with min gaurd for safety   Standing balance support: Bilateral upper extremity supported;During functional activity Standing balance-Leahy Scale: Poor Standing balance comment: reliant on external support                            Cognition Arousal/Alertness: Awake/alert Behavior During  Therapy: Flat affect Overall Cognitive Status: Impaired/Different from baseline Area of Impairment: Safety/judgement;Awareness;Problem solving;Attention                   Current Attention Level: Selective     Safety/Judgement: Decreased awareness of  safety;Decreased awareness of deficits Awareness: Emergent Problem Solving: Slow processing        Exercises      General Comments        Pertinent Vitals/Pain Pain Assessment: 0-10 Pain Score: 6  Pain Location: low back Pain Descriptors / Indicators: Sore;Discomfort Pain Intervention(s): Monitored during session;Repositioned    Home Living                      Prior Function            PT Goals (current goals can now be found in the care plan section) Acute Rehab PT Goals Patient Stated Goal: to get better Potential to Achieve Goals: Good Progress towards PT goals: Progressing toward goals    Frequency    Min 4X/week      PT Plan Current plan remains appropriate    Co-evaluation              AM-PAC PT "6 Clicks" Mobility   Outcome Measure  Help needed turning from your back to your side while in a flat bed without using bedrails?: A Lot Help needed moving from lying on your back to sitting on the side of a flat bed without using bedrails?: A Lot Help needed moving to and from a bed to a chair (including a wheelchair)?: A Lot Help needed standing up from a chair using your arms (e.g., wheelchair or bedside chair)?: A Lot Help needed to walk in hospital room?: A Lot Help needed climbing 3-5 steps with a railing? : A Lot 6 Click Score: 12    End of Session Equipment Utilized During Treatment: Gait belt Activity Tolerance: Patient limited by fatigue Patient left: in chair;with call bell/phone within reach;with chair alarm set;with family/visitor present;with nursing/sitter in room Nurse Communication: Mobility status PT Visit Diagnosis: Unsteadiness on feet (R26.81);Muscle weakness (generalized) (M62.81)     Time: 7858-8502 PT Time Calculation (min) (ACUTE ONLY): 24 min  Charges:  $Gait Training: 8-22 mins $Therapeutic Activity: 8-22 mins                     Governor Rooks, PTA Acute Rehabilitation Services Pager [phone] Office  [phone]     Mishika Flippen [name] 06/14/2018, 2:16 PM

## 2018-06-14 NOTE — Progress Notes (Signed)
   Patient doing well postoperatively status post stent of left cephalic arch.  Much improved thrill in the fistula.  I would think he could get dialysis via this fistula now but if he cannot will need tunneled dialysis catheter to allow arm to rest.  There are no further endovascular or open surgical improvements necessary for the fistula.  Fathima Bartl C. Donzetta Matters, MD [facility] Office: [phone] Pager: [phone]

## 2018-06-14 NOTE — Progress Notes (Signed)
Inpatient Rehabilitation Admissions Coordinator  I met with patient and his wife at bedside . Noted plans to initiate hemodialysis and access issues. I await insurance approval to admit patient to inpt rehab as well as initiation of hemodialysis before admission to CIR/inpt rehab.   Danne Baxter, RN, MSN Rehab Admissions Coordinator [phone] 06/14/2018 10:59 AM

## 2018-06-14 NOTE — Op Note (Signed)
    Patient name: Jason ALBUS Sr. MRN: [id number] DOB: 1946/02/08 Sex: male  06/14/2018 Pre-operative Diagnosis: End-stage renal disease, malfunctioning left upper extremity fistula Post-operative diagnosis:  Same Surgeon:  Eda Paschal. Donzetta Matters, MD Procedure Performed: 1.  Ultrasound-guided cannulation left arm AV fistula 2.  Left upper extremity fistulogram and central venogram 3.  Placement of 8 x 50 mm viabahn stent in left cephalic arch  Indications: 72 year old male on dialysis via left arm AV fistula.  They have had difficulty with cannulation he has pulsatility suggest central stenosis.  He is indicated for fistulogram possible invention.  Findings: There is stenosis at the cephalic arch possibly also at the level of the first rib.  After balloon angioplasty with 10 mm balloon this did not resolve so was stented with an 8 mm stent.  At completion there was brisk flow without reflux into the axillary vein.   Procedure:  The patient was identified in the holding area and taken to room 8.  The patient was then placed supine on the table and prepped and draped in the usual sterile fashion.  A time out was called.  Ultrasound was used to evaluate the left arm AV fistula this was cannulated under direct ultrasound guidance with micropuncture needle after the area was anesthetized with 1% lidocaine.  An image was saved the permanent record.  Micropuncture sheath was placed at the upper extremity shuntogram and central venogram performed.  With the above findings we changed for 7 French sheath.  We cross the stenosis with a Bentson wire and bare catheter.  I first dilated the lesion with 8 mm balloon followed by 10 mm balloon.  Unfortunately this did not resolve it.  It was stented with an 8 x 50 covered self-expanding stent.  This was then dilated with a millimeter balloon.  Completion demonstrated improved flow there was an improved thrill in the fistula.  Satisfied we remove the sheath and wire and  closed with 4 Monocryl.  He tolerated procedure without immediate complication.  Contrast:80cc   Brandon C. Donzetta Matters, MD [facility] Office: [phone] Pager: [phone]

## 2018-06-14 NOTE — Progress Notes (Addendum)
STROKE TEAM PROGRESS NOTE   SUBJECTIVE (INTERVAL HISTORY) His wife is at the bedside. They are awaiting vascular surgeon for fistulogram. Stable from stroke standpoint   OBJECTIVE Temp:  [97.6 F (36.4 C)-98.9 F (37.2 C)] 98.8 F (37.1 C) (12/09 0329) Pulse Rate:  [95-105] 99 (12/09 0736) Cardiac Rhythm: Sinus tachycardia (12/09 0701) Resp:  [17-20] 18 (12/09 0736) BP: (150-157)/(76-84) 151/76 (12/09 0736) SpO2:  [100 %] 100 % (12/09 0736)  Recent Labs  Lab 06/11/18 0439 06/11/18 1854 06/12/18 0511 06/13/18 0539 06/14/18 0427  NA 133* 131* 133* 133* 133*  K 4.5 4.6 5.0 5.4* 5.9*  CL 100 97* 98 97* 98  CO2 18* 18* 17* 20* 21*  GLUCOSE 83 105* 92 86 90  BUN 87* 95* 104* 107* 114*  CREATININE 9.23* 9.83* 10.53* 10.47* 10.16*  CALCIUM 9.0 9.0 9.3 9.5 9.5  PHOS  --  5.1*  --   --   --    Recent Labs  Lab 06/08/18 1514  06/09/18 0454  06/10/18 0320 06/11/18 0439 06/11/18 1513 06/12/18 0511 06/13/18 0539 06/14/18 0427  WBC 8.3  --  9.1   < > 12.2* 10.8*  --  9.2 8.6 9.5  NEUTROABS 6.8  --  7.6  --   --   --   --   --   --   --   HGB 12.7*   < > 11.3*   < > 8.1* 7.1* 7.0* 6.7* 8.1* 7.7*  HCT 39.9   < > 34.3*   < > 25.3* 21.7* 21.2* 21.1* 25.7* 23.7*  MCV 87.9  --  84.3   < > 85.8 84.8  --  85.1 86.2 84.6  PLT 153  --  149*   < > 131* 119*  --  126* 129* 146*   < > = values in this interval not displayed.    IMAGING No results found.   PHYSICAL EXAM   Pleasant obese elderly African-American male not in distress. He has a dialysis fistula in the left forearm. Afebrile. Head is nontraumatic. Neck is supple without bruit.    Cardiac exam no murmur or gallop. Lungs are clear to auscultation. Distal pulses are well felt.  Neurological Exam :  - awake alert, orientated x 3, answer questions appropriately, following all simple commands. PERRL, EOMI, no gaze preference, visual field full. Left facial droop, moderate dysarthria, tongue midline. RUE and RLE 5/5, LUE proximal  4/5 and distal 4/5 and LLE 4/5 proximal and distally. Sensation symmetrical. Coordination ataxia on the right FTN proportional to the weakness, and gait not tested.    ASSESSMENT/PLAN [name] is a 72 y.o. male with history of CKD, COPD, anemia, HTN, obesity, OSA on CPAP, rectal cancer admitted for slurry speech, left sided weakness and found down at home. No tPA given due to outside window.  Taken to IR for R ICA and MCA occlusion s/p rescue stents and TICI2b reperfusion.  Stroke:  right MCA infarct due to right ICA and MCA occlusion s/p IR and rescue stents with TICI2b reperfusion, most likely due to large vessel athero, but cardioembolic still in DDx  Resultant intubated with left hemiparesis  CT right MCA hyperdense sign  CTA head and neck - right ICA and MCA occlusion, right VA high grade stenosis vs. Occlusion, left siphon, right P1/P2, and left VA high grade stenosis  MRI  Right MCA patchy infarcts  MRA patent right ICA, right ACA and proximal right MCA.  Decreased right MCA branches,  left ICA siphon and right P2 severe stenosis.  Occluded right VA.  DSA - right ICA proximal occlusion s/p stenting with recannulization and right MCA occlusion s/p TICI 2b reperfusion with stenting. B/L VA severe stenosis origins.   2D Echo - EF 20 - 25%. Findings consistent with dilated cardiomyopathy. Consider TEE if    clinically indicated. No obvious source of cerebral embolism.  LE venous doppler no DVT  Recommend 30 day cardiac event monitoring as outpt to rule out afib  LDL 103  HgbA1c 5.5  Heparin subq for VTE prophylaxis  No antithrombotic prior to admission, now on aspirin 81 mg daily and brilinta due to intracranial stents. Continue at d/c  Therapy recommendations:  CIR  Disposition:  Pending . Medical stable from stroke standpoint for d/c. Await fistulogram.   ESRD Malfunctioning fistula L BC  Cre 5.10->4.57->4.84->6.69->9.23->10.16  Has AVF placed recently,  following with Dr. Posey Pronto  No HD yet PTA  Likely related to recent IV contrast load - difficult procedure  CXR no pleural effusion or fluid overload  Avoid nephrotoxic meds  Nephrology on board, Dr. Hollie Salk   For fistulogram today by Dr. Donzetta Matters. If unable to be salvaged, will placed temporary dialysis catheter.   Anemia   due to CKD and blood loss of groin hematoma  Groin hematoma stable now  Hb 12.7->11.3->9.5->8.1->7.1->6.7(transfused in HD)->8.1->7.7   Continue to monitor  Hx of rectal cancer  Hypertension . BP stable 150s . BP goal < 180 . On lasix and metoprolol PTA . metoprolol d/c'd d/t low BP  Long term BP goal normotensive  Hyperlipidemia  Home meds:  none   LDL 103, goal < 70  On lipitor 20  Continue lipitor on discharge  Other Stroke Risk Factors  Advanced age  Former Cigarette smoker, now quit  ETOH use in the past  Obstructive sleep apnea, on CPAP at home  Dilated cardiomyopathy w/ EF 20-25%  Other Active Problems  COPD  Rectal cancer history  Leukocytosis, resolved WBC 13.6->8.6->9.5  Hyperkalemia, 5.9  Metabolic bone disease, on Rocaltrol  Anemia of chronic disease  Hx rectal ca s/p XRT and chemo 2014  Hx COPD  Hospital day # [name], Jason [name], Jason [name], Jason [name], Jason [name] Advanced Practice Stroke Nurse [place] for Schedule & Pager information 06/14/2018 1:24 PM  I have personally obtained history,examined this patient, reviewed notes, independently viewed imaging studies, participated in medical decision making and plan of care.ROS completed by me personally and pertinent positives fully documented  I have made any additions or clarifications directly to the above note. Agree with note above.  Discuss with Dr. Donzetta Matters from vascular surgery. Patient is scheduled for fistulogram later today and if it is unsuccessful may need a dialysis catheter placed instead. Long discussion with the wife at the bedside  and answered questions. Greater than 50% time during this 25 minute visit was spent on counseling and coordination of care about his stroke and renal failure and dialysis fistula and answering questions  Jason Contras, MD Medical Director [facility] Pager: [phone] 06/14/2018 1:49 PM      To contact Stroke Continuity provider, please refer to [email or web address] After hours, contact General Neurology

## 2018-06-14 NOTE — Progress Notes (Signed)
Patient off unit for procedure in OR

## 2018-06-14 NOTE — Progress Notes (Signed)
[facility] ROUNDING NOTE   Subjective:   Some difficulty in cannulation of AV fistula.  Blood pressure 131/71 pulse 99 temperature 98.8 O2 sats 100% room air  Sodium 133 potassium 5.9 chloride 98 CO2 21 glucose 90 BUN 114 creatinine 10.16 calcium 9.5 WBC 5.9 hemoglobin 7.7 platelets 146  Objective:  Vital signs in last 24 hours:  Temp:  [97.6 F (36.4 C)-98.9 F (37.2 C)] 98.8 F (37.1 C) (12/09 0329) Pulse Rate:  [95-105] 99 (12/09 0736) Resp:  [17-20] 18 (12/09 0736) BP: (150-157)/(76-84) 151/76 (12/09 0736) SpO2:  [100 %] 100 % (12/09 0736)  Weight change:  Filed Weights   06/08/18 2145 06/13/18 0344  Weight: 83.9 kg 69.5 kg    Intake/Output: I/O last 3 completed shifts: In: -  Out: 825 [Urine:825]   Intake/Output this shift:  No intake/output data recorded.  CVS- RRR RS- CTA ABD- BS present soft non-distended EXT-left upper extremity AV fistula tortuous   Basic Metabolic Panel: Recent Labs  Lab 06/11/18 0439 06/11/18 1854 06/12/18 0511 06/13/18 0539 06/14/18 0427  NA 133* 131* 133* 133* 133*  K 4.5 4.6 5.0 5.4* 5.9*  CL 100 97* 98 97* 98  CO2 18* 18* 17* 20* 21*  GLUCOSE 83 105* 92 86 90  BUN 87* 95* 104* 107* 114*  CREATININE 9.23* 9.83* 10.53* 10.47* 10.16*  CALCIUM 9.0 9.0 9.3 9.5 9.5  PHOS  --  5.1*  --   --   --     Liver Function Tests: Recent Labs  Lab 06/08/18 1514 06/11/18 1854  AST 18  --   ALT 15  --   ALKPHOS 87  --   BILITOT 0.9  --   PROT 6.7  --   ALBUMIN 4.0 2.6*   No results for input(s): LIPASE, AMYLASE in the last 168 hours. No results for input(s): AMMONIA in the last 168 hours.  CBC: Recent Labs  Lab 06/08/18 1514  06/09/18 0454  06/10/18 0320 06/11/18 0439 06/11/18 1513 06/12/18 0511 06/13/18 0539 06/14/18 0427  WBC 8.3  --  9.1   < > 12.2* 10.8*  --  9.2 8.6 9.5  NEUTROABS 6.8  --  7.6  --   --   --   --   --   --   --   HGB 12.7*   < > 11.3*   < > 8.1* 7.1* 7.0* 6.7* 8.1* 7.7*  HCT  39.9   < > 34.3*   < > 25.3* 21.7* 21.2* 21.1* 25.7* 23.7*  MCV 87.9  --  84.3   < > 85.8 84.8  --  85.1 86.2 84.6  PLT 153  --  149*   < > 131* 119*  --  126* 129* 146*   < > = values in this interval not displayed.    Cardiac Enzymes: No results for input(s): CKTOTAL, CKMB, CKMBINDEX, TROPONINI in the last 168 hours.  BNP: Invalid input(s): POCBNP  CBG: Recent Labs  Lab 06/08/18 1515 06/09/18 0812  GLUCAP 102* 103*    Microbiology: Results for orders placed or performed during the hospital encounter of 06/08/18  MRSA PCR Screening     Status: None   Collection Time: 06/08/18  9:46 PM  Result Value Ref Range Status   MRSA by PCR NEGATIVE NEGATIVE Final    Comment:        The GeneXpert MRSA Assay (FDA approved for NASAL specimens only), is one component of a comprehensive MRSA colonization surveillance program. It is not  intended to diagnose MRSA infection nor to guide or monitor treatment for MRSA infections. Performed at [facility], [place] [address] [place], [place] [address]     Coagulation Studies: No results for input(s): LABPROT, INR in the last 72 hours.  Urinalysis: No results for input(s): COLORURINE, LABSPEC, PHURINE, GLUCOSEU, HGBUR, BILIRUBINUR, KETONESUR, PROTEINUR, UROBILINOGEN, NITRITE, LEUKOCYTESUR in the last 72 hours.  Invalid input(s): APPERANCEUR    Imaging: No results found.   Medications:   . sodium chloride    . sodium chloride     .  stroke: mapping our early stages of recovery book   Does not apply Once  . allopurinol  100 mg Oral Daily  . aspirin  81 mg Oral Daily  . atorvastatin  20 mg Oral q1800  . calcium carbonate  1 tablet Oral Q breakfast  . chlorhexidine  15 mL Mouth Rinse BID  . Chlorhexidine Gluconate Cloth  6 each Topical Q0600  . cholecalciferol  5,000 Units Oral Daily  . darbepoetin (ARANESP) injection - DIALYSIS  100 mcg Intravenous Q Mon-HD  . doxazosin  8 mg Oral Daily  . feeding supplement (ENSURE  ENLIVE)  237 mL Oral BID BM  . gabapentin  300 mg Oral TID  . heparin injection (subcutaneous)  5,000 Units Subcutaneous Q8H  . mouth rinse  15 mL Mouth Rinse q12n4p  . multivitamin  1 tablet Oral QHS  . pantoprazole  40 mg Oral Daily  . sodium zirconium cyclosilicate  10 g Oral Daily  . ticagrelor  90 mg Oral BID   sodium chloride, sodium chloride, acetaminophen **OR** acetaminophen (TYLENOL) oral liquid 160 mg/5 mL **OR** acetaminophen, alteplase, heparin, lidocaine (PF), lidocaine-prilocaine, pentafluoroprop-tetrafluoroeth, senna-docusate, traMADol  Assessment/ Plan:   Right MCA infarct due to right ICA and MCA occlusion status post IR rescue stents with reperfusion.  Dilated cardiomyopathy ejection fraction 20 to 25%  End-stage renal disease stage V unable to cannulate 06/12/2018 [facility] to be consulted for fistulogram  Hyperkalemia we will give Lokelma 10 g 3 times daily  Metabolic bone disease PTH 161 we will start Rocaltrol 0.25 mcg daily  Anemia iron saturations 20% started darbepoetin  History of rectal cancer status post radiation and chemotherapy 2014  History of COPD   LOS: Craig @TODAY @9 :45 AM

## 2018-06-15 ENCOUNTER — Encounter (HOSPITAL_COMMUNITY): Payer: Self-pay | Admitting: Vascular Surgery

## 2018-06-15 LAB — CBC
HCT: 22.6 % — ABNORMAL LOW (ref 39.0–52.0)
Hemoglobin: 7.3 g/dL — ABNORMAL LOW (ref 13.0–17.0)
MCH: 27.8 pg (ref 26.0–34.0)
MCHC: 32.3 g/dL (ref 30.0–36.0)
MCV: 85.9 fL (ref 80.0–100.0)
Platelets: 209 10*3/uL (ref 150–400)
RBC: 2.63 MIL/uL — ABNORMAL LOW (ref 4.22–5.81)
RDW: 17.4 % — ABNORMAL HIGH (ref 11.5–15.5)
WBC: 9.1 10*3/uL (ref 4.0–10.5)
nRBC: 0 % (ref 0.0–0.2)

## 2018-06-15 LAB — RENAL FUNCTION PANEL
Albumin: 2.6 g/dL — ABNORMAL LOW (ref 3.5–5.0)
Anion gap: 12 (ref 5–15)
BUN: 105 mg/dL — ABNORMAL HIGH (ref 8–23)
CO2: 21 mmol/L — ABNORMAL LOW (ref 22–32)
Calcium: 9.8 mg/dL (ref 8.9–10.3)
Chloride: 96 mmol/L — ABNORMAL LOW (ref 98–111)
Creatinine, Ser: 9.19 mg/dL — ABNORMAL HIGH (ref 0.61–1.24)
GFR calc non Af Amer: 5 mL/min — ABNORMAL LOW (ref >60)
GFR, EST AFRICAN AMERICAN: 6 mL/min — AB (ref >60)
Glucose, Bld: 94 mg/dL (ref 70–99)
Phosphorus: 5.1 mg/dL — ABNORMAL HIGH (ref 2.5–4.6)
Potassium: 5 mmol/L (ref 3.5–5.1)
Sodium: 129 mmol/L — ABNORMAL LOW (ref 135–145)

## 2018-06-15 LAB — ALT: ALT: 12 U/L (ref 0–44)

## 2018-06-15 MED ORDER — CHLORHEXIDINE GLUCONATE CLOTH 2 % EX PADS
6.0000 | MEDICATED_PAD | Freq: Every day | CUTANEOUS | Status: DC
  Administered 2018-06-15: 6 via TOPICAL

## 2018-06-15 MED ORDER — PENTAFLUOROPROP-TETRAFLUOROETH EX AERO: 1.0000 "application " | INHALATION_SPRAY | CUTANEOUS | Status: DC | PRN

## 2018-06-15 MED ORDER — SODIUM CHLORIDE 0.9 % IV SOLN: 100.0000 mL | INTRAVENOUS | Status: DC | PRN

## 2018-06-15 MED ORDER — LIDOCAINE-PRILOCAINE 2.5-2.5 % EX CREA: 1.0000 "application " | TOPICAL_CREAM | CUTANEOUS | Status: DC | PRN

## 2018-06-15 MED ORDER — ALTEPLASE 2 MG IJ SOLR: 2.0000 mg | Freq: Once | INTRAMUSCULAR | Status: DC | PRN

## 2018-06-15 MED ORDER — HEPARIN SODIUM (PORCINE) 1000 UNIT/ML DIALYSIS: 1000.0000 [IU] | INTRAMUSCULAR | Status: DC | PRN

## 2018-06-15 MED ORDER — CHLORHEXIDINE GLUCONATE CLOTH 2 % EX PADS: 6.0000 | MEDICATED_PAD | Freq: Every day | CUTANEOUS | Status: DC

## 2018-06-15 MED ORDER — DARBEPOETIN ALFA 100 MCG/0.5ML IJ SOSY
100.0000 ug | PREFILLED_SYRINGE | INTRAMUSCULAR | Status: DC
  Administered 2018-06-15: 100 ug via INTRAVENOUS
  Filled 2018-06-15: qty 0.5

## 2018-06-15 MED ORDER — LIDOCAINE HCL (PF) 1 % IJ SOLN: 5.0000 mL | INTRAMUSCULAR | Status: DC | PRN

## 2018-06-15 MED ORDER — DARBEPOETIN ALFA 100 MCG/0.5ML IJ SOSY
PREFILLED_SYRINGE | INTRAMUSCULAR | Status: AC
  Administered 2018-06-15: 100 ug via INTRAVENOUS
  Filled 2018-06-15: qty 0.5

## 2018-06-15 MED FILL — Midazolam HCl Inj 2 MG/2ML (Base Equivalent): INTRAMUSCULAR | Qty: 2 | Status: AC

## 2018-06-15 NOTE — Progress Notes (Signed)
HD tx initiated via 17G x 2 w/o problem however access is deep and needles are at a severe angle w/ 2x2's underneath to help prop the needles up Pull/push/flush well w/o problem VSS Will continue to monitor while on HD tx

## 2018-06-15 NOTE — Progress Notes (Signed)
  Speech Language Pathology Treatment: Dysphagia  Patient Details Name: Jason INGALSBE Sr. MRN: [id number] DOB: [birth year] Today's Date: 06/15/2018 Time: 6553-7482 SLP Time Calculation (min) (ACUTE ONLY): 15 min  Assessment / Plan / Recommendation Clinical Impression  Skilled treatment session focused on dysphagia goals. Pt was upgraded to regular diet textures by MD/nursing. Wife present and states that pt has been consuming regular diet without overt s/s of aspiration. Pt continues to be edentulous, he has dentures but doesn't want to have them brought to the hospital. Pt consumed regular snack without signs of oral dysphagia. ST to sign off for dysphagia but continue to require services to target dysarthria and cognition. Wife is agreeable.    HPI HPI: 72 y.o. male with history of CKD, COPD, anemia, HTN, obesity, OSA on CPAP, rectal cancer found down at home and admitted with dysarthric speech, left sided weakness.  Dx right ICA/MCA occlusion, s/p IR and stents. Intubated for procedure, extubated next day 12/4.        SLP Plan  Continue with current plan of care       Recommendations  Diet recommendations: Regular;Thin liquid Liquids provided via: Cup;Straw Medication Administration: Whole meds with liquid Supervision: Patient able to self feed Compensations: Minimize environmental distractions;Slow rate;Small sips/bites;Other (Comment) Postural Changes and/or Swallow Maneuvers: Seated upright 90 degrees                General recommendations: Rehab consult Oral Care Recommendations: Oral care BID Follow up Recommendations: Inpatient Rehab SLP Visit Diagnosis: Dysphagia, oropharyngeal phase (R13.12) Plan: Continue with current plan of care       GO                Jason [name] 06/15/2018, 1:47 PM

## 2018-06-15 NOTE — Discharge Summary (Addendum)
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 Anemia   . Arthritis   . Chronic renal insufficiency 07/31/2011  . COPD (chronic obstructive pulmonary disease) ([place])    pt denies  . Deficiency anemia 07/20/2012  . Diverticulosis   . Edema 09/21/2010   Qualifier: Diagnosis of  By: Jerold Coombe    . ERECTILE DYSFUNCTION, MILD 05/11/2007   Qualifier: Diagnosis of  By: Sherren Mocha MD, Jory Ee   . Exertional dyspnea 10/30/2015  . GOUT 05/11/2007   Qualifier: Diagnosis of  By: Sherren Mocha MD, Jory Ee   . Hereditary and idiopathic peripheral neuropathy 09/14/2014  . History of radiation therapy 02/21/13-03/31/13   rectum 50.4Gy total dose  . Hypersomnia 10/30/2015  . Hypertension   . Lung nodule 10/30/2015  . OAB (overactive bladder) 01/09/2016  . Obesity (BMI 30.0-34.9) 10/30/2015  . Obstructive sleep apnea 07/29/2010   HST 11/2015 AHI 54.  On autocpap  >> 10 cm   . Peripheral edema 09/19/2013  . rectal ca dx'd 02/01/13   rectal. Radiation and chemotherapy- remains on chemotherapy-next tx. 08-22-13.   . S/P partial  lobectomy of lung 11/20/2015  . S/P thoracotomy   . SUI (stress urinary incontinence), male 01/09/2016   Past Surgical History:  Procedure Laterality Date  . A/V FISTULAGRAM Left 06/14/2018   Procedure: A/V FISTULAGRAM;  Surgeon: Waynetta Sandy, MD;  Location: [facility];  Service: Cardiovascular;  Laterality: Left;  . AV FISTULA PLACEMENT Left 03/24/2016   Procedure: CREATION OF LEFT BRACIOCEPHALIC ARTERIOVENOUS (AV) FISTULA;  Surgeon: Elam Dutch, MD;  Location: [facility];  Service: Vascular;  Laterality: Left;  . COLON SURGERY  05/20/2013  . COLONOSCOPY  2010   [facility]  . EUS N/A 02/03/2013   Procedure: LOWER ENDOSCOPIC ULTRASOUND (EUS);  Surgeon: Milus Banister, MD;  Location: [facility];  Service: Endoscopy;  Laterality: N/A;  . ILEOSTOMY CLOSURE N/A 08/24/2013   Procedure: CLOSURE OF LOOP ILEOSTOMY ;  Surgeon: Leighton Ruff, MD;  Location: [facility];  Service: General;  Laterality: N/A;  . IR ANGIO VERTEBRAL SEL SUBCLAVIAN INNOMINATE BILAT MOD SED  06/08/2018  . IR CT HEAD LTD  06/08/2018  . IR INTRAVSC STENT CERV CAROTID W/O EMB-PROT MOD SED INC ANGIO  06/08/2018  . IR PERCUTANEOUS ART THROMBECTOMY/INFUSION INTRACRANIAL INC DIAG ANGIO  06/08/2018  . LAPAROSCOPIC LOW ANTERIOR RESECTION N/A 05/20/2013   Procedure: LAPAROSCOPIC LOW ANTERIOR RESECTION WITH SPLENIC FLEXURE MOBILIZATION;  Surgeon: Leighton Ruff, MD;  Location: [facility];  Service: General;  Laterality: N/A;  . OSTOMY N/A 05/20/2013   Procedure: diverting OSTOMY;  Surgeon: Elmo Putt  Marcello Moores, MD;  Location: [facility];  Service: General;  Laterality: N/A;  . PERIPHERAL VASCULAR INTERVENTION Left 06/14/2018   Procedure: PERIPHERAL VASCULAR INTERVENTION;  Surgeon: Waynetta Sandy, MD;  Location: [facility];  Service: Cardiovascular;  Laterality: Left;  AVF on the Left  . RADIOLOGY WITH ANESTHESIA N/A 06/08/2018   Procedure: IR WITH ANESTHESIA;  Surgeon: Radiologist, Medication, MD;  Location: [facility];   Service: Radiology;  Laterality: N/A;  . VIDEO ASSISTED THORACOSCOPY (VATS)/WEDGE RESECTION Left 11/20/2015   Procedure: VIDEO ASSISTED THORACOSCOPY (VATS)/LUNG RESECTION;  Surgeon: [name], MD;  Location: [facility];  Service: Thoracic;  Laterality: Left;  Marland Kitchen VIDEO BRONCHOSCOPY N/A 11/20/2015   Procedure: VIDEO BRONCHOSCOPY;  Surgeon: [name], MD;  Location: [facility];  Service: Thoracic;  Laterality: N/A;    Medications to be continued on Rehab Allergies as of 06/16/2018      Reactions   Nsaids Other (See Comments)   Asked by surgeon to add this medication class as intolerance due to patients renal insufficiency.   Amlodipine Other (See Comments)   Edema      Medication List    STOP taking these medications   acetaminophen 325 MG tablet Commonly known as:  TYLENOL   furosemide 80 MG tablet Commonly known as:  LASIX   KLOR-CON M20 20 MEQ tablet Generic drug:  potassium chloride SA   loperamide 2 MG capsule Commonly known as:  IMODIUM   metoprolol succinate 50 MG 24 hr tablet Commonly known as:  TOPROL-XL   polycarbophil 625 MG tablet Commonly known as:  FIBERCON     TAKE these medications   allopurinol 100 MG tablet Commonly known as:  ZYLOPRIM Take 1 tablet (100 mg total) by mouth daily.   alteplase 2 MG injection Commonly known as:  CATHFLO ACTIVASE 2 mg by Intracatheter route once as needed for open catheter.   alteplase 2 MG injection Commonly known as:  CATHFLO ACTIVASE 2 mg by Intracatheter route once as needed for open catheter.   aspirin 81 MG chewable tablet Chew 1 tablet (81 mg total) by mouth daily. Start taking on:  06/17/2018   atorvastatin 20 MG tablet Commonly known as:  LIPITOR Take 1 tablet (20 mg total) by mouth daily at 6 PM.   calcitRIOL 0.25 MCG capsule Commonly known as:  ROCALTROL Take 1 capsule (0.25 mcg total) by mouth daily. Start taking on:  06/17/2018   calcium carbonate 1250 (500 Ca) MG tablet Commonly known as:   OS-CAL - dosed in mg of elemental calcium Take 1 tablet (500 mg of elemental calcium total) by mouth daily with breakfast. Start taking on:  06/17/2018 What changed:    medication strength  how much to take  when to take this   cholecalciferol 1000 units tablet Commonly known as:  VITAMIN D Take 5,000 Units by mouth daily.   Darbepoetin Alfa 100 MCG/0.5ML Sosy injection Commonly known as:  ARANESP Inject 0.5 mLs (100 mcg total) into the vein every Tuesday with hemodialysis. Start taking on:  06/22/2018   doxazosin 8 MG tablet Commonly known as:  CARDURA Take 1 tablet (8 mg total) by mouth daily.   feeding supplement (ENSURE ENLIVE) Liqd Take 237 mLs by mouth 2 (two) times daily between meals.   gabapentin 300 MG capsule Commonly known as:  NEURONTIN Take 1 capsule (300 mg total) by mouth 3 (three) times daily. What changed:    when to take this  additional instructions   heparin 1000 unit/mL  Soln injection 1 mL (1,000 Units total) by Dialysis route as needed (in dialysis).   heparin 1000 unit/mL Soln injection 1 mL (1,000 Units total) by Dialysis route as needed (in dialysis).   heparin 5000 UNIT/ML injection Inject 1 mL (5,000 Units total) into the skin every 8 (eight) hours.   lidocaine (PF) 1 % Soln injection Commonly known as:  XYLOCAINE Inject 5 mLs into the skin as needed (topical anesthesia for hemodialysis ifGEBAUERS is ineffective.).   lidocaine (PF) 1 % Soln injection Commonly known as:  XYLOCAINE Inject 5 mLs into the skin as needed (topical anesthesia for hemodialysis ifGEBAUERS is ineffective.).   lidocaine-prilocaine cream Commonly known as:  EMLA Apply 1 application topically as needed (topical anesthesia for hemodialysis if Gebauers and Lidocaine injection are ineffective.).   lidocaine-prilocaine cream Commonly known as:  EMLA Apply 1 application topically as needed (topical anesthesia for hemodialysis if Gebauers and Lidocaine injection  are ineffective.).   multivitamin Tabs tablet Take 1 tablet by mouth at bedtime.   pantoprazole 40 MG tablet Commonly known as:  PROTONIX Take 1 tablet (40 mg total) by mouth daily. Start taking on:  06/17/2018   pentafluoroprop-tetrafluoroeth Aero Commonly known as:  GEBAUERS Apply 1 application topically as needed (topical anesthesia for hemodialysis).   pentafluoroprop-tetrafluoroeth Aero Commonly known as:  GEBAUERS Apply 1 application topically as needed (topical anesthesia for hemodialysis).   senna-docusate 8.6-50 MG tablet Commonly known as:  Senokot-S Take 1 tablet by mouth at bedtime as needed for mild constipation.   ticagrelor 90 MG Tabs tablet Commonly known as:  BRILINTA Take 1 tablet (90 mg total) by mouth 2 (two) times daily.   traMADol 50 MG tablet Commonly known as:  ULTRAM Take 1 tablet (50 mg total) by mouth every 12 (twelve) hours as needed for moderate pain.       LABORATORY STUDIES CBC    Component Value Date/Time   WBC 13.8 (H) 06/16/2018 1121   RBC 2.70 (L) 06/16/2018 1121   HGB 7.3 (L) 06/16/2018 1121   HGB 10.9 (L) 03/17/2016 1546   HCT 23.4 (L) 06/16/2018 1121   HCT 35.4 (L) 03/17/2016 1546   PLT 184 06/16/2018 1121   PLT 209 03/17/2016 1546   MCV 86.7 06/16/2018 1121   MCV 87.9 03/17/2016 1546   MCH 27.0 06/16/2018 1121   MCHC 31.2 06/16/2018 1121   RDW 17.2 (H) 06/16/2018 1121   RDW 18.5 (H) 03/17/2016 1546   LYMPHSABS 0.3 (L) 06/09/2018 0454   LYMPHSABS 1.2 03/17/2016 1546   MONOABS 1.1 (H) 06/09/2018 0454   MONOABS 1.2 (H) 03/17/2016 1546   EOSABS 0.0 06/09/2018 0454   EOSABS 0.1 03/17/2016 1546   BASOSABS 0.0 06/09/2018 0454   BASOSABS 0.1 03/17/2016 1546   CMP    Component Value Date/Time   NA 133 (L) 06/16/2018 1121   NA 140 02/24/2017   NA 138 02/20/2014 1432   K 4.6 06/16/2018 1121   K 3.5 02/20/2014 1432   CL 95 (L) 06/16/2018 1121   CL 101 12/09/2012 1048   CO2 23 06/16/2018 1121   CO2 30 (H) 02/20/2014 1432    GLUCOSE 100 (H) 06/16/2018 1121   GLUCOSE 93 02/20/2014 1432   GLUCOSE 96 12/09/2012 1048   BUN 79 (H) 06/16/2018 1121   BUN 50 (A) 08/10/2017   BUN 36.0 (H) 02/20/2014 1432   CREATININE 7.48 (H) 06/16/2018 1121   CREATININE 2.4 (H) 02/20/2014 1432   CALCIUM 9.6 06/16/2018 1121   CALCIUM 10.4 02/20/2014 1432  PROT 6.7 06/08/2018 1514   PROT 7.2 02/20/2014 1432   ALBUMIN 2.6 (L) 06/16/2018 1121   ALBUMIN 3.8 02/20/2014 1432   AST 18 06/08/2018 1514   AST 22 02/20/2014 1432   ALT 12 06/15/2018 2156   ALT 12 02/20/2014 1432   ALKPHOS 87 06/08/2018 1514   ALKPHOS 86 02/20/2014 1432   BILITOT 0.9 06/08/2018 1514   BILITOT 0.32 02/20/2014 1432   GFRNONAA 7 (L) 06/16/2018 1121   GFRAA 8 (L) 06/16/2018 1121   COAGS Lab Results  Component Value Date   INR 1.06 06/08/2018   INR 1.11 11/16/2015   INR 1.16 05/26/2013   Lipid Panel    Component Value Date/Time   CHOL 171 06/09/2018 0454   TRIG 101 06/09/2018 0454   TRIG 104 05/11/2006 1014   HDL 48 06/09/2018 0454   CHOLHDL 3.6 06/09/2018 0454   VLDL 20 06/09/2018 0454   LDLCALC 103 (H) 06/09/2018 0454   HgbA1C  Lab Results  Component Value Date   HGBA1C 5.5 06/09/2018   Urinalysis    Component Value Date/Time   COLORURINE YELLOW 06/09/2018 0418   APPEARANCEUR CLEAR 06/09/2018 0418   LABSPEC 1.029 06/09/2018 0418   PHURINE 5.0 06/09/2018 0418   GLUCOSEU NEGATIVE 06/09/2018 0418   GLUCOSEU NEGATIVE 10/14/2016 1144   HGBUR SMALL (A) 06/09/2018 0418   HGBUR negative 05/29/2010 0952   BILIRUBINUR NEGATIVE 06/09/2018 0418   BILIRUBINUR Negative 10/14/2016 1220   KETONESUR NEGATIVE 06/09/2018 0418   PROTEINUR 100 (A) 06/09/2018 0418   UROBILINOGEN 0.2 10/14/2016 1220   UROBILINOGEN 0.2 10/14/2016 1144   NITRITE NEGATIVE 06/09/2018 0418   LEUKOCYTESUR NEGATIVE 06/09/2018 0418   Urine Drug Screen     Component Value Date/Time   LABOPIA NONE DETECTED 06/09/2018 0418   COCAINSCRNUR NONE DETECTED 06/09/2018 0418    LABBENZ NONE DETECTED 06/09/2018 0418   AMPHETMU NONE DETECTED 06/09/2018 0418   THCU NONE DETECTED 06/09/2018 0418   LABBARB NONE DETECTED 06/09/2018 0418    Alcohol Level    Component Value Date/Time   ETH <10 06/08/2018 1514    SIGNIFICANT DIAGNOSTIC STUDIES Ct Head Code Stroke Wo Contrast 06/08/2018 1. Hyperdense Right MCA M1 segment compatible with ELVO. Subtle cytotoxic edema suspected in the right insula, ASPECTS is 9.  2. Negative for bleed.   Ct Angio Head W Or Wo Contrast Ct Cerebral Perfusion W Contrast Ct Angio Neck W Or Wo Contrast 06/08/2018 1. Positive for LVO (occluded Right ICA, Right ICA terminus and Right MCA) with CT Perfusion criteria favorable for endovascular reperfusion (core infarct estimated at 13 mL with mismatch volume 119 mL, mismatch ratio of 10.2). 2. Positive also for occluded Right Vertebral Artery and soft plaque or thrombus at the Left Vertebral Artery origin. In conjunction with #1 this might reflect sequelae of cardiac or paradoxical emboli. This was discussed with Dr. Juliet Rude in [place] at 1359 hours.  3. Superimposed intracranial atherosclerosis including moderate or severe stenosis of the: - Left ICA supraclinoid segment. - Left vertebral artery V4 segment. - Right PCA P1/P2.  4. Stable visible upper chest since the CT on 12/01/2017.   IR Intravsc Stent Cerv Carotid w/o Emb-Prot Mod Sed IR Angio Vertebral Sel Subclavia Innominate Bilat Mod Sed IR Percutaneous Art Thrombectomy/Infusion Intracranial Inc Diag Angio IR CT Head Ltd Endovascular complete revascularization of occluded right internal carotid artery acutely with stent assisted angioplasty, and the right middle cerebral artery and the right internal carotid artery terminus, and the proximal right anterior cerebral artery  with 2 passes with the 5 mm x 33 mm Embotap retrieval device, and 1 pass with the 4 mm x 40 mm X Solitaire retrieval device using a TICI 2b revascularization. Rescue  stenting of recurrent occlusion of the right middle cerebral artery in the right MCA trifurcation region with an Atlas Neuroform 4 mm x 24 mm stent. Mechanical thrombolysis of acute occlusion of the stented segment of the right middle cerebral artery with 6 mg of super selective intracranial intra-arterial Integrilin, and mechanical thrombolysis using the microcatheter and micro guidewire achieving a TICI 2b revascularization. Placement of an Enterprise 4 mm x 30 mm stent at the site of a focal dissection in the mid right ICA as described.  MR Brain WO Contrast MR MRA Head WO Contrast 06/09/2018 1. Patchy acute right MCA infarcts with greatest involvement of the basal ganglia. 2. Mild chronic small vessel ischemic disease. 3. Continued patency of the intracranial right ICA, right ACA, and proximal right MCA following revascularization. Artifact from right MCA stent with decreased number of distal MCA branch vessels. 4. Mild right and moderate to severe left supraclinoid ICA stenoses. 5. Moderate bilateral ACA origin stenoses. 6. Occluded distal right vertebral artery. 7. Severe proximal right P2 stenosis. 8. 2 mm right cavernous ICA aneurysm.  Dg Chest Port 1 View 06/10/2018 Endotracheal tube removed.   Resolution of CHF  Transthoracic Echocardiogram  06/09/2018 - Left ventricle: The cavity size was mildly dilated. Systolicfunction was severely reduced. The estimated ejection fractionwas in the range of 20% to 25%. Diffuse hypokinesis. - Left atrium: The atrium was mildly dilated. - Right atrium: The atrium was mildly dilated. - Atrial septum: No defect or patent foramen ovale was identified. Impressions:  Findings consistent with dilated cardiomyopathy. Consider TEE ifclinically indicated. No obvious source of cerebral embolism.  Bilateral LE Venous Dopplers  06/09/2018 Right: There is no evidence of deep vein thrombosis in the lower extremity. However, portions of this examination  were limited- see technologist comments above. No cystic structure found in the popliteal fossa. Left: There is no evidence of deep vein thrombosis in the lower extremity. However, portions of this examination were limited- see technologist comments above. No cystic structure found in the popliteal fossa.     HISTORY OF PRESENT ILLNESS Jason VIVANCO Sr. is a 72 y.o. male past medical history of prediabetes, CKD, hypertension, obstructive sleep apnea, rectal cancer status post chemotherapy, normally alert awake oriented x4 at baseline and functional independent, last seen normal around 5:30 AM on 06/08/2018 when his wife left for work. He was found down in the bathroom unable to move the left side by EMS. His wife attempted to reach him and not reaching him, had EMS check on him.  He was in the bathroom, incontinent of urine, slumped over the top.  He was unable to move the left side of his body.  His speech was very slurred.  He had a lot of secretions in mouth that needed to be cleared.  Upon putting him on the stretcher, the patient was able to talk although with a lot of dysarthria.  He was able to follow commands.  A code stroke was called he was brought into the emergency room for an LVO positive stroke. In the emergency room, he was able to provide reasonable history but not in terms of time of last known normal onset. His wife made it to the hospital right after him.  She confirmed the last known well at 5:30 AM. He was outside  the window for IV TPA. Based on CT, CT and CT perfusion imaging- felt to be a candidate for IR which was offered and family consented to proceed.  Taken to IR for R ICA and MCA occlusion s/p rescue stents and TICI2b reperfusion. He was admitted to the neuro ICU for further evaluation and treatment.  Premorbid modified Rankin scale (mRS):1    HOSPITAL COURSE Mr. IZEK CORVINO Sr. is a 72 y.o. male with history of CKD, COPD, anemia, HTN, obesity, OSA on CPAP, rectal cancer  admitted for slurry speech, left sided weakness and found down at home. No tPA given due to outside window.  Taken to IR for R ICA and MCA occlusion s/p rescue stents and TICI2b reperfusion.  Stroke:  right MCA infarct due to right ICA and MCA occlusion s/p IR and rescue stents with TICI2b reperfusion, most likely due to large vessel athero, but cardioembolic still in DDx  Resultant  left hemiparesis  CT right MCA hyperdense sign  CTA head and neck - right ICA and MCA occlusion, right VA high grade stenosis vs. Occlusion, left siphon, right P1/P2, and left VA high grade stenosis  MRI  Right MCA patchy infarcts  MRA patent right ICA, right ACA and proximal right MCA.  Decreased right MCA branches, left ICA siphon and right P2 severe stenosis.  Occluded right VA.  DSA - right ICA proximal occlusion s/p stenting with recannulization and right MCA occlusion s/p TICI 2b reperfusion with stenting. B/L VA severe stenosis origins.   2D Echo - EF 20 - 25%. Findings consistent with dilated cardiomyopathy. No obvious source of cerebral embolism.  LE venous doppler no DVT  Recommend 30 day cardiac event monitoring as outpt to rule out afib  LDL 103  HgbA1c 5.5  No antithrombotic prior to admission, now on aspirin 81 mg daily and brilinta due to intracranial stents. Continue at d/c  Therapy recommendations:  CIR  Disposition:  CIR  ESRD Malfunctioning fistula L BC  Cre 5.10->4.57->4.84->6.69->9.23->10.16  Has AVF placed recently, following with Dr. Posey Pronto  No HD yet PTA  Likely related to recent IV contrast load - difficult procedure  CXR no pleural effusion or fluid overload  Avoid nephrotoxic meds  Nephrology on board, Dr. Tawni Levy 12/9 s/p stent L cephalic arch with improved thrill in fistula by Dr. Donzetta Matters.   HD started 06/15/2018, for repeat 06/16/2018  Anemia   due to CKD and blood loss of groin hematoma  Groin hematoma stable now  Hb  12.7->11.3->9.5->8.1->7.1->6.7(transfused in HD)->8.1->7.7 (fistula procedure)->7.3   Continue to monitor  Hx of rectal cancer  Hypertension Hypotention  BP low 100s  Dropped down to the 80s during HD  BP goal > 90 < 180  On lasix and metoprolol PTA  metoprolol d/c'd d/t low BP  Long term BP goal normotensive  Hyperlipidemia  Home meds:  none   LDL 103, goal < 70  On lipitor 20  Continue lipitor on discharge  Other Stroke Risk Factors  Advanced age  Former Cigarette smoker, now quit  ETOH use in the past  Obstructive sleep apnea, on CPAP at home  Dilated cardiomyopathy w/ EF 20-25%  Other Active Problems  COPD  Rectal cancer history  Leukocytosis, recurring, now 9.1->13.8  Hyperkalemia, resolved  4.6  Metabolic bone disease, PTH 161on Rocaltrol  Anemia of chronic disease on darbepoetin  Hx rectal ca s/p XRT and chemo 2014  Hx COPD  malnutrition w/ increased nutrient needs. On ensure  DISCHARGE EXAM Blood pressure (!) 100/58, pulse (!) 113, temperature 98.8 F (37.1 C), temperature source Oral, resp. rate 20, height 5\' 8"  (1.727 m), weight 68.9 kg, SpO2 100 %. Pleasant obese elderly African-American male not in distress. He has a dialysis fistula in the left forearm. Afebrile. Head is nontraumatic. Neck is supple without bruit.    Cardiac exam no murmur or gallop. Lungs are clear to auscultation. Distal pulses are well felt. Neurological Exam :  - awake alert, orientated x 3, answer questions appropriately, following all simple commands. PERRL, EOMI, no gaze preference, visual field full. Left facial droop, moderate dysarthria, tongue midline. RUE and RLE 5/5, LUE proximal 4/5 and distal 4/5 and LLE 4/5 proximal and distally. Sensation symmetrical. Coordination ataxia on the right FTN proportional to the weakness, and gait not tested.    Discharge Diet  Renal/carb modified w/ 1200 fluid restriction. HS snack. Thin liquids  DISCHARGE  PLAN  Disposition:  Transfer to [facility] for ongoing PT, OT and ST  aspirin 81 mg daily and Brilinta 90 bid for secondary stroke prevention.  Recommend ongoing risk factor control by Primary Care Physician at time of discharge from inpatient rehabilitation.  Follow-up Jason Lima, MD in 2 weeks following discharge from rehab.  Follow-up Dr. Estanislado Pandy in 4 weeks after d/c from IP rehab  Follow-up in [facility] in 4 weeks following discharge from rehab, office to schedule an appointment.   40 minutes were spent preparing discharge.  Burnetta Sabin, MSN, APRN, ANVP-BC, AGPCNP-BC Advanced Practice Stroke Nurse [place] for Schedule & Pager information 06/16/2018 2:56 PM  I have personally obtained history,examined this patient, reviewed notes, independently viewed imaging studies, participated in medical decision making and plan of care.ROS completed by me personally and pertinent positives fully documented  I have made any additions or clarifications directly to the above note. Agree with note above.    Antony Contras, MD Medical Director [facility] Pager: [phone] 06/16/2018 5:43 PM

## 2018-06-15 NOTE — Progress Notes (Signed)
[facility] ROUNDING NOTE   Subjective:   Some difficulty in cannulation of AV fistula.  Appreciate Dr. Servando Snare placement of stent left cephalic arch 43/07/5398  Blood pressure 159/75 pulse 96 temperature 98.8 O2 sats 100% room air  Labs pending this morning.  Hemoglobin 7.3   Objective:  Vital signs in last 24 hours:  Temp:  [98.2 F (36.8 C)-98.8 F (37.1 C)] 98.8 F (37.1 C) (12/10 0831) Pulse Rate:  [0-111] 95 (12/10 0831) Resp:  [10-131] 20 (12/10 0831) BP: (146-166)/(62-95) 159/75 (12/10 0831) SpO2:  [0 %-100 %] 100 % (12/10 0831) Weight:  [71.1 kg] 71.1 kg (12/10 0401)  Weight change:  Filed Weights   06/08/18 2145 06/13/18 0344 06/15/18 0401  Weight: 83.9 kg 69.5 kg 71.1 kg    Intake/Output: I/O last 3 completed shifts: In: 360 [P.O.:360] Out: -    Intake/Output this shift:  Total I/O In: 240 [P.O.:240] Out: -   CVS- RRR RS- CTA ABD- BS present soft non-distended EXT-left upper extremity AV fistula tortuous   Basic Metabolic Panel: Recent Labs  Lab 06/11/18 0439 06/11/18 1854 06/12/18 0511 06/13/18 0539 06/14/18 0427  NA 133* 131* 133* 133* 133*  K 4.5 4.6 5.0 5.4* 5.9*  CL 100 97* 98 97* 98  CO2 18* 18* 17* 20* 21*  GLUCOSE 83 105* 92 86 90  BUN 87* 95* 104* 107* 114*  CREATININE 9.23* 9.83* 10.53* 10.47* 10.16*  CALCIUM 9.0 9.0 9.3 9.5 9.5  PHOS  --  5.1*  --   --   --     Liver Function Tests: Recent Labs  Lab 06/08/18 1514 06/11/18 1854  AST 18  --   ALT 15  --   ALKPHOS 87  --   BILITOT 0.9  --   PROT 6.7  --   ALBUMIN 4.0 2.6*   No results for input(s): LIPASE, AMYLASE in the last 168 hours. No results for input(s): AMMONIA in the last 168 hours.  CBC: Recent Labs  Lab 06/08/18 1514  06/09/18 0454  06/11/18 0439 06/11/18 1513 06/12/18 0511 06/13/18 0539 06/14/18 0427 06/15/18 0540  WBC 8.3  --  9.1   < > 10.8*  --  9.2 8.6 9.5 9.1  NEUTROABS 6.8  --  7.6  --   --   --   --   --   --   --    HGB 12.7*   < > 11.3*   < > 7.1* 7.0* 6.7* 8.1* 7.7* 7.3*  HCT 39.9   < > 34.3*   < > 21.7* 21.2* 21.1* 25.7* 23.7* 22.6*  MCV 87.9  --  84.3   < > 84.8  --  85.1 86.2 84.6 85.9  PLT 153  --  149*   < > 119*  --  126* 129* 146* 209   < > = values in this interval not displayed.    Cardiac Enzymes: No results for input(s): CKTOTAL, CKMB, CKMBINDEX, TROPONINI in the last 168 hours.  BNP: Invalid input(s): POCBNP  CBG: Recent Labs  Lab 06/08/18 1515 06/09/18 0812  GLUCAP 102* 103*    Microbiology: Results for orders placed or performed during the hospital encounter of 06/08/18  MRSA PCR Screening     Status: None   Collection Time: 06/08/18  9:46 PM  Result Value Ref Range Status   MRSA by PCR NEGATIVE NEGATIVE Final    Comment:        The GeneXpert MRSA Assay (FDA approved for NASAL  specimens only), is one component of a comprehensive MRSA colonization surveillance program. It is not intended to diagnose MRSA infection nor to guide or monitor treatment for MRSA infections. Performed at [facility], [place] [address], [place], [place] [address]     Coagulation Studies: No results for input(s): LABPROT, INR in the last 72 hours.  Urinalysis: No results for input(s): COLORURINE, LABSPEC, PHURINE, GLUCOSEU, HGBUR, BILIRUBINUR, KETONESUR, PROTEINUR, UROBILINOGEN, NITRITE, LEUKOCYTESUR in the last 72 hours.  Invalid input(s): APPERANCEUR    Imaging: No results found.   Medications:   . sodium chloride    . sodium chloride     .  stroke: mapping our early stages of recovery book   Does not apply Once  . allopurinol  100 mg Oral Daily  . aspirin  81 mg Oral Daily  . atorvastatin  20 mg Oral q1800  . calcitRIOL  0.25 mcg Oral Daily  . calcium carbonate  1 tablet Oral Q breakfast  . chlorhexidine  15 mL Mouth Rinse BID  . Chlorhexidine Gluconate Cloth  6 each Topical Q0600  . cholecalciferol  5,000 Units Oral Daily  . darbepoetin (ARANESP) injection -  DIALYSIS  100 mcg Intravenous Q Mon-HD  . doxazosin  8 mg Oral Daily  . feeding supplement (ENSURE ENLIVE)  237 mL Oral BID BM  . gabapentin  300 mg Oral TID  . heparin injection (subcutaneous)  5,000 Units Subcutaneous Q8H  . mouth rinse  15 mL Mouth Rinse q12n4p  . multivitamin  1 tablet Oral QHS  . pantoprazole  40 mg Oral Daily  . ticagrelor  90 mg Oral BID   sodium chloride, sodium chloride, acetaminophen **OR** acetaminophen (TYLENOL) oral liquid 160 mg/5 mL **OR** acetaminophen, alteplase, heparin, lidocaine (PF), lidocaine-prilocaine, pentafluoroprop-tetrafluoroeth, senna-docusate, traMADol  Assessment/ Plan:   Right MCA infarct due to right ICA and MCA occlusion status post IR rescue stents with reperfusion.  Dilated cardiomyopathy ejection fraction 20 to 25%  End-stage renal disease stage V status post stent to cephalic artery  Hyperkalemia plan dialysis today  Metabolic bone disease PTH 161 we will start Rocaltrol 0.25 mcg daily  Anemia iron saturations 20% started darbepoetin  History of rectal cancer status post radiation and chemotherapy 2014  History of COPD   LOS: [place] @TODAY @10 :46 AM

## 2018-06-15 NOTE — Progress Notes (Signed)
Spoke with dialysis to confirm HD today. Dialysis stated he was on the schedule for this afternoon.

## 2018-06-15 NOTE — Progress Notes (Signed)
Physical Therapy Treatment Patient Details Name: Jason [name]. MRN: [id number] DOB: [birth year] Today's Date: 06/15/2018    History of Present Illness 72 year old male with rectal cancer status post chemotherapy and radiation, CKD stage V, OSA who presented with slurred speech and left-sided weakness and found with right MCA infarct secondary to right ICA and MCA occlusion status post stent and reperfusion.      PT Comments    Pt asleep in bed with wife at bedside upon arrival. Pt easily aroused and willing to participate in therapy. Pt participated in gait training with a RW and LE strengthening exercises. Pt required Mod A for gait training this session for safety. Next session progress standing balance- required increased time/ unable to perform standing LE exercise due to LE weakness and poor standing balance. I have discussed the patient's current level of function related to L sided weakness with the patient and his wife. They acknowledge understanding of this and do not feel the patient would be able to have their care needs met at home. They are interested in post-acute rehab in an inpatient setting. Pt would benefit from continued PT in order to progress toward stated goals and maximize functional independence. Pt remains appropriate for CIR based on current functional status.    Follow Up Recommendations  CIR;Supervision/Assistance - 24 hour     Equipment Recommendations  Other (comment)(TBA)    Recommendations for Other Services Rehab consult     Precautions / Restrictions Precautions Precautions: Fall Precaution Comments: L inattention Restrictions Weight Bearing Restrictions: No    Mobility  Bed Mobility Overal bed mobility: Needs Assistance Bed Mobility: Supine to Sit     Supine to sit: Min assist     General bed mobility comments: cues to initiate mobility, min A to support trunk upright. Pt reaching for therapists hands to elevate trunk.  cues to scoot  forward towards EOB. Pt required Assist to elevate bil LE onto bed and to get positioned in bed.  Transfers Overall transfer level: Needs assistance Equipment used: Rolling walker (2 wheeled) Transfers: Sit to/from Stand Sit to Stand: Mod assist Stand pivot transfers: Mod assist;Min assist       General transfer comment: cues for safe hand placement; cues and assist for sequencing RW use; modA for steadying.  Ambulation/Gait Ambulation/Gait assistance: Mod assist Gait Distance (Feet): 50 Feet Assistive device: Rolling walker (2 wheeled) Gait Pattern/deviations: Step-to pattern;Decreased step length - left;Decreased stride length;Decreased weight shift to right;Decreased stance time - right;Ataxic;Leaning posteriorly;Staggering left;Trunk flexed     General Gait Details: Pt requried VC to maintain upright posture and maintain close proximity to RW especially when turning. Cueing for position of L hand on RW.   Stairs             Wheelchair Mobility    Modified Rankin (Stroke Patients Only) Modified Rankin (Stroke Patients Only) Pre-Morbid Rankin Score: No symptoms Modified Rankin: Moderately severe disability     Balance Overall balance assessment: Needs assistance Sitting-balance support: Bilateral upper extremity supported;Feet supported Sitting balance-Leahy Scale: Poor Sitting balance - Comments: minguard for safety with sitting balance, noticed slight posterior lean Postural control: Posterior lean Standing balance support: Bilateral upper extremity supported;During functional activity Standing balance-Leahy Scale: Poor Standing balance comment: reliant on external support and bil UE support                            Cognition Arousal/Alertness: Awake/alert Behavior During Therapy: Flat affect Overall  Cognitive Status: Impaired/Different from baseline Area of Impairment: Safety/judgement;Awareness;Problem solving;Attention                    Current Attention Level: Selective     Safety/Judgement: Decreased awareness of safety;Decreased awareness of deficits Awareness: Emergent Problem Solving: Slow processing General Comments: Pt's wife was present this session      Exercises Total Joint Exercises Ankle Circles/Pumps: AROM;10 reps;Supine;Right;Left;Standing(Pt unable to perfrom in standing with bil UE support) Heel Slides: AROM;10 reps;Right;Left;Supine Hip ABduction/ADduction: AROM;10 reps;Right;Left;Supine(Increased time to perform) Marching in Standing: AROM;10 reps;Right;Left;Standing(required bil UE and external support to maintain balance.  noted weakness on L)    General Comments General comments (skin integrity, edema, etc.): Once seated and positioned in recliner, pt requested to go back to bed. He and his wife reported that he had been sitting in the chair for a while this morning.      Pertinent Vitals/Pain Pain Assessment: (P) No/denies pain Pain Score: (P) 0-No pain Faces Pain Scale: Hurts little more Pain Location: low back Pain Descriptors / Indicators: Sore;Discomfort Pain Intervention(s): Monitored during session;Repositioned    Home Living                      Prior Function            PT Goals (current goals can now be found in the care plan section) Acute Rehab PT Goals Patient Stated Goal: to get better PT Goal Formulation: With patient Time For Goal Achievement: 06/24/18 Potential to Achieve Goals: Good Progress towards PT goals: Progressing toward goals    Frequency    Min 4X/week      PT Plan Current plan remains appropriate    Co-evaluation              AM-PAC PT "6 Clicks" Mobility   Outcome Measure  Help needed turning from your back to your side while in a flat bed without using bedrails?: A Lot Help needed moving from lying on your back to sitting on the side of a flat bed without using bedrails?: A Lot Help needed moving to and from a bed to a  chair (including a wheelchair)?: A Lot Help needed standing up from a chair using your arms (e.g., wheelchair or bedside chair)?: A Lot Help needed to walk in hospital room?: A Lot Help needed climbing 3-5 steps with a railing? : A Lot 6 Click Score: 12    End of Session Equipment Utilized During Treatment: Gait belt Activity Tolerance: Patient limited by fatigue Patient left: with call bell/phone within reach;with family/visitor present;in bed;with bed alarm set Nurse Communication: Mobility status PT Visit Diagnosis: Unsteadiness on feet (R26.81);Muscle weakness (generalized) (M62.81)     Time: 1537-9432 PT Time Calculation (min) (ACUTE ONLY): 30 min  Charges:  $Gait Training: 8-22 mins $Therapeutic Exercise: 8-22 mins                     [address] SPTA   [place] 06/15/2018, 1:25 PM

## 2018-06-15 NOTE — Progress Notes (Addendum)
STROKE TEAM PROGRESS NOTE   SUBJECTIVE (INTERVAL HISTORY) His wife is at the bedside. She hopes for d/c to rehab today but has not yet heard from anyone. Pt stable.  OBJECTIVE Temp:  [98.2 F (36.8 C)-98.8 F (37.1 C)] 98.8 F (37.1 C) (12/10 0831) Pulse Rate:  [0-111] 95 (12/10 0831) Cardiac Rhythm: Sinus tachycardia (12/10 0705) Resp:  [10-131] 20 (12/10 0831) BP: (146-166)/(62-95) 159/75 (12/10 0831) SpO2:  [0 %-100 %] 100 % (12/10 0831) Weight:  [71.1 kg] 71.1 kg (12/10 0401)  Recent Labs  Lab 06/11/18 0439 06/11/18 1854 06/12/18 0511 06/13/18 0539 06/14/18 0427  NA 133* 131* 133* 133* 133*  K 4.5 4.6 5.0 5.4* 5.9*  CL 100 97* 98 97* 98  CO2 18* 18* 17* 20* 21*  GLUCOSE 83 105* 92 86 90  BUN 87* 95* 104* 107* 114*  CREATININE 9.23* 9.83* 10.53* 10.47* 10.16*  CALCIUM 9.0 9.0 9.3 9.5 9.5  PHOS  --  5.1*  --   --   --    Recent Labs  Lab 06/08/18 1514  06/09/18 0454  06/11/18 0439 06/11/18 1513 06/12/18 0511 06/13/18 0539 06/14/18 0427 06/15/18 0540  WBC 8.3  --  9.1   < > 10.8*  --  9.2 8.6 9.5 9.1  NEUTROABS 6.8  --  7.6  --   --   --   --   --   --   --   HGB 12.7*   < > 11.3*   < > 7.1* 7.0* 6.7* 8.1* 7.7* 7.3*  HCT 39.9   < > 34.3*   < > 21.7* 21.2* 21.1* 25.7* 23.7* 22.6*  MCV 87.9  --  84.3   < > 84.8  --  85.1 86.2 84.6 85.9  PLT 153  --  149*   < > 119*  --  126* 129* 146* 209   < > = values in this interval not displayed.    IMAGING No results found.   PHYSICAL EXAM per Dr. Leonie Man   Pleasant obese elderly African-American male not in distress. He has a dialysis fistula in the left forearm. Afebrile. Head is nontraumatic. Neck is supple without bruit.    Cardiac exam no murmur or gallop. Lungs are clear to auscultation. Distal pulses are well felt. Neurological Exam :  - awake alert, orientated x 3, answer questions appropriately, following all simple commands. PERRL, EOMI, no gaze preference, visual field full. Left facial droop, moderate  dysarthria, tongue midline. RUE and RLE 5/5, LUE proximal 4/5 and distal 4/5 and LLE 4/5 proximal and distally. Sensation symmetrical. Coordination ataxia on the right FTN proportional to the weakness, and gait not tested.    ASSESSMENT/PLAN Mr. Jason YONO Sr. is a 72 y.o. male with history of CKD, COPD, anemia, HTN, obesity, OSA on CPAP, rectal cancer admitted for slurry speech, left sided weakness and found down at home. No tPA given due to outside window.  Taken to IR for R ICA and MCA occlusion s/p rescue stents and TICI2b reperfusion.  Stroke:  right MCA infarct due to right ICA and MCA occlusion s/p IR and rescue stents with TICI2b reperfusion, most likely due to large vessel athero, but cardioembolic still in DDx  Resultant  left hemiparesis  CT right MCA hyperdense sign  CTA head and neck - right ICA and MCA occlusion, right VA high grade stenosis vs. Occlusion, left siphon, right P1/P2, and left VA high grade stenosis  MRI  Right MCA patchy infarcts  MRA  patent right ICA, right ACA and proximal right MCA.  Decreased right MCA branches, left ICA siphon and right P2 severe stenosis.  Occluded right VA.  DSA - right ICA proximal occlusion s/p stenting with recannulization and right MCA occlusion s/p TICI 2b reperfusion with stenting. B/L VA severe stenosis origins.   2D Echo - EF 20 - 25%. Findings consistent with dilated cardiomyopathy. Consider TEE if    clinically indicated. No obvious source of cerebral embolism.  LE venous doppler no DVT  Recommend 30 day cardiac event monitoring as outpt to rule out afib  LDL 103  HgbA1c 5.5  Heparin subq for VTE prophylaxis  No antithrombotic prior to admission, now on aspirin 81 mg daily and brilinta due to intracranial stents. Continue at d/c  Therapy recommendations:  CIR  Disposition:  Pending . Medical stable from stroke standpoint for d/c. Await fistulogram.   ESRD Malfunctioning fistula L BC  Cre  5.10->4.57->4.84->6.69->9.23->10.16  Has AVF placed recently, following with Dr. Posey Pronto  No HD yet PTA  Likely related to recent IV contrast load - difficult procedure  CXR no pleural effusion or fluid overload  Avoid nephrotoxic meds  Nephrology on board, Dr. Tawni Levy 12/9 s/p stent L cephalic arch with improved thrill in fistula by Dr. Donzetta Matters.   Anemia   due to CKD and blood loss of groin hematoma  Groin hematoma stable now  Hb 12.7->11.3->9.5->8.1->7.1->6.7(transfused in HD)->8.1->7.7 (fistula procedure)->7.3   Continue to monitor  Hx of rectal cancer  Hypertension . BP stable 150s . BP goal < 180 . On lasix and metoprolol PTA . metoprolol d/c'd d/t low BP  Long term BP goal normotensive  Hyperlipidemia  Home meds:  none   LDL 103, goal < 70  On lipitor 20  Continue lipitor on discharge  Other Stroke Risk Factors  Advanced age  Former Cigarette smoker, now quit  ETOH use in the past  Obstructive sleep apnea, on CPAP at home  Dilated cardiomyopathy w/ EF 20-25%  Other Active Problems  COPD  Rectal cancer history  Leukocytosis, resolved WBC 9.1  Hyperkalemia, 5.9  Metabolic bone disease, on Rocaltrol  Anemia of chronic disease  Hx rectal ca s/p XRT and chemo 2014  Hx COPD  Await rehab bed. Stable without changes.  Hospital day # [place], MSN, APRN, ANVP-BC, AGPCNP-BC Advanced Practice Stroke Nurse [place] for Schedule & Pager information 06/15/2018 10:11 AM  I have personally obtained history,examined this patient, reviewed notes, independently viewed imaging studies, participated in medical decision making and plan of care.ROS completed by me personally and pertinent positives fully documented  I have made any additions or clarifications directly to the above note. Agree with note above.  Plan transfer to inpatient rehabilitation after dialysis completed today and dialysis AV fistula  access is satisfactory. Long discussion with the patient and wife at the bedside and answered questions. Greater than 50% time during this 25 minute visit was spent on counseling and coordination of care about his stroke and discussion about rehabilitation needs and answering questions  Antony Contras, MD Medical Director [place] Pager: [phone] 06/15/2018 12:57 PM    To contact Stroke Continuity provider, please refer to [email or web address] After hours, contact General Neurology

## 2018-06-15 NOTE — Progress Notes (Signed)
Inpatient Rehabilitation Admissions Coordinator  I met with patient and his wife at bedside. Patient sleeping. I have insurance approval for an inpt rehab admit, but await initiation of dialysis and to assess if HD access usable today. Order for hemodialysis from Dr. Justin Mend is noted.  Danne Baxter, RN, MSN Rehab Admissions Coordinator [phone] 06/15/2018 11:02 AM

## 2018-06-15 NOTE — Progress Notes (Signed)
Occupational Therapy Treatment Patient Details Name: Jason [name] Sr. MRN: [id number] DOB: 02-Apr-1946 Today's Date: 06/15/2018    History of present illness 72 year old male with rectal cancer status post chemotherapy and radiation, CKD stage V, OSA who presented with slurred speech and left-sided weakness and found with right MCA infarct secondary to right ICA and MCA occlusion status post stent and reperfusion.     OT comments  Pt presents sitting up in bed willing to participate in therapy session. Assisted with transfer OOB to recliner to eat breakfast during this session. Pt requiring overall minA for bed mobility; min-modA for stand pivot transfer to recliner using RW, requiring cues/external assist for sequencing RW use during transitions. Pt completing grooming ADLs with setup assist while seated with cues for thoroughness, min setup assist provided for breakfast tray end of session. Feel POC remains appropriate at this time. Will continue to follow acutely to progress pt towards established OT goals.   Follow Up Recommendations  CIR;Supervision/Assistance - 24 hour    Equipment Recommendations  3 in 1 bedside commode;Tub/shower bench          Precautions / Restrictions Precautions Precautions: Fall Precaution Comments: L inattention Restrictions Weight Bearing Restrictions: No       Mobility Bed Mobility Overal bed mobility: Needs Assistance Bed Mobility: Supine to Sit     Supine to sit: Min assist     General bed mobility comments: cues to initiate mobility, light minA to support trunk upright, cues to scoot forward towards EOB  Transfers Overall transfer level: Needs assistance Equipment used: Rolling walker (2 wheeled) Transfers: Sit to/from Stand Sit to Stand: Mod assist Stand pivot transfers: Mod assist;Min assist       General transfer comment: cues for safe hand placement; cues and assist for sequencing RW use; min-modA for steadying while taking steps  towards recliner    Balance Overall balance assessment: Needs assistance Sitting-balance support: Bilateral upper extremity supported;Feet supported Sitting balance-Leahy Scale: Poor Sitting balance - Comments: minguard for safety with sitting balance, noticed slight posterior lean Postural control: Posterior lean Standing balance support: Bilateral upper extremity supported;During functional activity Standing balance-Leahy Scale: Poor Standing balance comment: reliant on external support                           ADL either performed or assessed with clinical judgement   ADL Overall ADL's : Needs assistance/impaired Eating/Feeding: Supervision/ safety;Set up;Sitting Eating/Feeding Details (indicate cue type and reason): pt eating breakfast upon arrival, agreeable to transition to recliner for increased ease of performing task; setup for open of drink contatiners end of session  Grooming: Wash/dry hands;Wash/dry face;Set up;Sitting;Bed level Grooming Details (indicate cue type and reason): pt requesting towel to wash hands from breakfast; cues to wash face as pt also with food around mouth                             Functional mobility during ADLs: Minimal assistance;Moderate assistance;Rolling walker(stand pivot transfer) General ADL Comments: pt agreeable to getting OOB to eat breakfast, assisted with transfer to recliner and setup in optimal position to continue eating     Vision       Perception     Praxis      Cognition Arousal/Alertness: Awake/alert Behavior During Therapy: Flat affect Overall Cognitive Status: Impaired/Different from baseline Area of Impairment: Safety/judgement;Awareness;Problem solving;Attention  Current Attention Level: Selective     Safety/Judgement: Decreased awareness of safety;Decreased awareness of deficits Awareness: Emergent Problem Solving: Slow processing          Exercises      Shoulder Instructions       General Comments noted pt HR up to 132 with brief standing activity, decreasing to 120's once returned to sitting    Pertinent Vitals/ Pain       Pain Assessment: Faces Faces Pain Scale: Hurts little more Pain Location: low back Pain Descriptors / Indicators: Sore;Discomfort Pain Intervention(s): Monitored during session;Repositioned  Home Living                                          Prior Functioning/Environment              Frequency  Min 3X/week        Progress Toward Goals  OT Goals(current goals can now be found in the care plan section)  Progress towards OT goals: Progressing toward goals  Acute Rehab OT Goals Patient Stated Goal: to get better OT Goal Formulation: With patient Time For Goal Achievement: 06/24/18 Potential to Achieve Goals: Good  Plan Discharge plan remains appropriate    Co-evaluation                 AM-PAC OT "6 Clicks" Daily Activity     Outcome Measure   Help from another person eating meals?: A Little Help from another person taking care of personal grooming?: A Little Help from another person toileting, which includes using toliet, bedpan, or urinal?: A Lot Help from another person bathing (including washing, rinsing, drying)?: A Lot Help from another person to put on and taking off regular upper body clothing?: A Lot Help from another person to put on and taking off regular lower body clothing?: A Lot 6 Click Score: 14    End of Session Equipment Utilized During Treatment: Gait belt;Rolling walker  OT Visit Diagnosis: Other abnormalities of gait and mobility (R26.89);Muscle weakness (generalized) (M62.81);Other symptoms and signs involving cognitive function;Hemiplegia and hemiparesis Hemiplegia - Right/Left: Left Hemiplegia - dominant/non-dominant: Non-Dominant Hemiplegia - caused by: Cerebral infarction   Activity Tolerance Patient tolerated treatment well    Patient Left in chair;with call bell/phone within reach;with chair alarm set   Nurse Communication Mobility status        Time: 0109-3235 OT Time Calculation (min): 20 min  Charges: OT General Charges $OT Visit: 1 Visit OT Treatments $Self Care/Home Management : 8-22 mins  Lou Cal, OT [facility] Pager [phone] Office [phone]    Raymondo Band 06/15/2018, 10:01 AM

## 2018-06-15 NOTE — Plan of Care (Signed)
  Problem: Health Behavior/Discharge Planning: Goal: Ability to manage health-related needs will improve Outcome: Progressing   Problem: Self-Care: Goal: Verbalization of feelings and concerns over difficulty with self-care will improve Outcome: Progressing   Problem: Self-Care: Goal: Ability to communicate needs accurately will improve Outcome: Progressing   Problem: Nutrition: Goal: Risk of aspiration will decrease Outcome: Progressing

## 2018-06-16 ENCOUNTER — Encounter (HOSPITAL_COMMUNITY): Payer: Self-pay

## 2018-06-16 ENCOUNTER — Inpatient Hospital Stay (HOSPITAL_COMMUNITY)
Admission: RE | Admit: 2018-06-16 | Discharge: 2018-06-29 | DRG: 056 | Disposition: A | Discharge status: Home Health | Payer: Medicare Other | Source: Intra-hospital | Attending: Physical Medicine & Rehabilitation | Admitting: Physical Medicine & Rehabilitation

## 2018-06-16 ENCOUNTER — Other Ambulatory Visit: Payer: Self-pay

## 2018-06-16 DIAGNOSIS — Z87891 Personal history of nicotine dependence: Secondary | ICD-10-CM

## 2018-06-16 DIAGNOSIS — D638 Anemia in other chronic diseases classified elsewhere: Secondary | ICD-10-CM

## 2018-06-16 DIAGNOSIS — I120 Hypertensive chronic kidney disease with stage 5 chronic kidney disease or end stage renal disease: Secondary | ICD-10-CM | POA: Diagnosis not present

## 2018-06-16 DIAGNOSIS — I63111 Cerebral infarction due to embolism of right vertebral artery: Secondary | ICD-10-CM

## 2018-06-16 DIAGNOSIS — N2581 Secondary hyperparathyroidism of renal origin: Secondary | ICD-10-CM | POA: Diagnosis not present

## 2018-06-16 DIAGNOSIS — E1165 Type 2 diabetes mellitus with hyperglycemia: Secondary | ICD-10-CM | POA: Diagnosis not present

## 2018-06-16 DIAGNOSIS — Z923 Personal history of irradiation: Secondary | ICD-10-CM | POA: Diagnosis not present

## 2018-06-16 DIAGNOSIS — G4733 Obstructive sleep apnea (adult) (pediatric): Secondary | ICD-10-CM | POA: Diagnosis not present

## 2018-06-16 DIAGNOSIS — I69354 Hemiplegia and hemiparesis following cerebral infarction affecting left non-dominant side: Principal | ICD-10-CM

## 2018-06-16 DIAGNOSIS — M109 Gout, unspecified: Secondary | ICD-10-CM | POA: Diagnosis not present

## 2018-06-16 DIAGNOSIS — Z823 Family history of stroke: Secondary | ICD-10-CM | POA: Diagnosis not present

## 2018-06-16 DIAGNOSIS — E875 Hyperkalemia: Secondary | ICD-10-CM | POA: Diagnosis present

## 2018-06-16 DIAGNOSIS — D72829 Elevated white blood cell count, unspecified: Secondary | ICD-10-CM | POA: Diagnosis not present

## 2018-06-16 DIAGNOSIS — I69322 Dysarthria following cerebral infarction: Secondary | ICD-10-CM

## 2018-06-16 DIAGNOSIS — D696 Thrombocytopenia, unspecified: Secondary | ICD-10-CM | POA: Diagnosis present

## 2018-06-16 DIAGNOSIS — E441 Mild protein-calorie malnutrition: Secondary | ICD-10-CM

## 2018-06-16 DIAGNOSIS — N186 End stage renal disease: Secondary | ICD-10-CM | POA: Diagnosis not present

## 2018-06-16 DIAGNOSIS — I420 Dilated cardiomyopathy: Secondary | ICD-10-CM | POA: Diagnosis present

## 2018-06-16 DIAGNOSIS — I6931 Attention and concentration deficit following cerebral infarction: Secondary | ICD-10-CM

## 2018-06-16 DIAGNOSIS — R339 Retention of urine, unspecified: Secondary | ICD-10-CM | POA: Diagnosis present

## 2018-06-16 DIAGNOSIS — Z902 Acquired absence of lung [part of]: Secondary | ICD-10-CM | POA: Diagnosis not present

## 2018-06-16 DIAGNOSIS — Z9221 Personal history of antineoplastic chemotherapy: Secondary | ICD-10-CM | POA: Diagnosis not present

## 2018-06-16 DIAGNOSIS — Z992 Dependence on renal dialysis: Secondary | ICD-10-CM | POA: Diagnosis not present

## 2018-06-16 DIAGNOSIS — I132 Hypertensive heart and chronic kidney disease with heart failure and with stage 5 chronic kidney disease, or end stage renal disease: Secondary | ICD-10-CM | POA: Diagnosis present

## 2018-06-16 DIAGNOSIS — Z8249 Family history of ischemic heart disease and other diseases of the circulatory system: Secondary | ICD-10-CM

## 2018-06-16 DIAGNOSIS — R739 Hyperglycemia, unspecified: Secondary | ICD-10-CM | POA: Diagnosis not present

## 2018-06-16 DIAGNOSIS — I5022 Chronic systolic (congestive) heart failure: Secondary | ICD-10-CM | POA: Diagnosis not present

## 2018-06-16 DIAGNOSIS — I272 Pulmonary hypertension, unspecified: Secondary | ICD-10-CM | POA: Diagnosis not present

## 2018-06-16 DIAGNOSIS — I63511 Cerebral infarction due to unspecified occlusion or stenosis of right middle cerebral artery: Secondary | ICD-10-CM | POA: Diagnosis not present

## 2018-06-16 DIAGNOSIS — M898X9 Other specified disorders of bone, unspecified site: Secondary | ICD-10-CM | POA: Diagnosis present

## 2018-06-16 DIAGNOSIS — J449 Chronic obstructive pulmonary disease, unspecified: Secondary | ICD-10-CM | POA: Diagnosis present

## 2018-06-16 DIAGNOSIS — I639 Cerebral infarction, unspecified: Secondary | ICD-10-CM | POA: Diagnosis present

## 2018-06-16 DIAGNOSIS — G629 Polyneuropathy, unspecified: Secondary | ICD-10-CM | POA: Diagnosis not present

## 2018-06-16 DIAGNOSIS — E1122 Type 2 diabetes mellitus with diabetic chronic kidney disease: Secondary | ICD-10-CM | POA: Diagnosis not present

## 2018-06-16 DIAGNOSIS — D72819 Decreased white blood cell count, unspecified: Secondary | ICD-10-CM | POA: Diagnosis not present

## 2018-06-16 DIAGNOSIS — Z85048 Personal history of other malignant neoplasm of rectum, rectosigmoid junction, and anus: Secondary | ICD-10-CM | POA: Diagnosis not present

## 2018-06-16 DIAGNOSIS — I69398 Other sequelae of cerebral infarction: Secondary | ICD-10-CM

## 2018-06-16 DIAGNOSIS — R269 Unspecified abnormalities of gait and mobility: Secondary | ICD-10-CM

## 2018-06-16 DIAGNOSIS — E611 Iron deficiency: Secondary | ICD-10-CM | POA: Diagnosis present

## 2018-06-16 DIAGNOSIS — I63411 Cerebral infarction due to embolism of right middle cerebral artery: Secondary | ICD-10-CM | POA: Diagnosis not present

## 2018-06-16 DIAGNOSIS — I10 Essential (primary) hypertension: Secondary | ICD-10-CM

## 2018-06-16 DIAGNOSIS — D631 Anemia in chronic kidney disease: Secondary | ICD-10-CM | POA: Diagnosis not present

## 2018-06-16 DIAGNOSIS — G8114 Spastic hemiplegia affecting left nondominant side: Secondary | ICD-10-CM | POA: Diagnosis not present

## 2018-06-16 LAB — RENAL FUNCTION PANEL
ANION GAP: 15 (ref 5–15)
Albumin: 2.6 g/dL — ABNORMAL LOW (ref 3.5–5.0)
Albumin: 2.6 g/dL — ABNORMAL LOW (ref 3.5–5.0)
Anion gap: 14 (ref 5–15)
BUN: 79 mg/dL — ABNORMAL HIGH (ref 8–23)
BUN: 43 mg/dL — ABNORMAL HIGH (ref 8–23)
CALCIUM: 9.6 mg/dL (ref 8.9–10.3)
CO2: 23 mmol/L (ref 22–32)
CO2: 27 mmol/L (ref 22–32)
CREATININE: 4.96 mg/dL — AB (ref 0.61–1.24)
Calcium: 9.5 mg/dL (ref 8.9–10.3)
Chloride: 95 mmol/L — ABNORMAL LOW (ref 98–111)
Chloride: 95 mmol/L — ABNORMAL LOW (ref 98–111)
Creatinine, Ser: 7.48 mg/dL — ABNORMAL HIGH (ref 0.61–1.24)
GFR calc Af Amer: 8 mL/min — ABNORMAL LOW (ref >60)
GFR calc Af Amer: 13 mL/min — ABNORMAL LOW (ref >60)
GFR calc non Af Amer: 7 mL/min — ABNORMAL LOW (ref >60)
GFR, EST NON AFRICAN AMERICAN: 11 mL/min — AB (ref >60)
GLUCOSE: 117 mg/dL — AB (ref 70–99)
Glucose, Bld: 100 mg/dL — ABNORMAL HIGH (ref 70–99)
POTASSIUM: 4.6 mmol/L (ref 3.5–5.1)
Phosphorus: 4.3 mg/dL (ref 2.5–4.6)
Phosphorus: 3 mg/dL (ref 2.5–4.6)
Potassium: 3.7 mmol/L (ref 3.5–5.1)
SODIUM: 133 mmol/L — AB (ref 135–145)
Sodium: 136 mmol/L (ref 135–145)

## 2018-06-16 LAB — CBC
HCT: 23.4 % — ABNORMAL LOW (ref 39.0–52.0)
HCT: 24.9 % — ABNORMAL LOW (ref 39.0–52.0)
Hemoglobin: 7.3 g/dL — ABNORMAL LOW (ref 13.0–17.0)
Hemoglobin: 7.7 g/dL — ABNORMAL LOW (ref 13.0–17.0)
MCH: 27 pg (ref 26.0–34.0)
MCH: 27 pg (ref 26.0–34.0)
MCHC: 31.2 g/dL (ref 30.0–36.0)
MCHC: 30.9 g/dL (ref 30.0–36.0)
MCV: 86.7 fL (ref 80.0–100.0)
MCV: 87.4 fL (ref 80.0–100.0)
PLATELETS: 215 10*3/uL (ref 150–400)
Platelets: 184 10*3/uL (ref 150–400)
RBC: 2.7 MIL/uL — ABNORMAL LOW (ref 4.22–5.81)
RBC: 2.85 MIL/uL — AB (ref 4.22–5.81)
RDW: 17.2 % — ABNORMAL HIGH (ref 11.5–15.5)
RDW: 17.1 % — ABNORMAL HIGH (ref 11.5–15.5)
WBC: 13.8 10*3/uL — ABNORMAL HIGH (ref 4.0–10.5)
WBC: 14.5 10*3/uL — ABNORMAL HIGH (ref 4.0–10.5)
nRBC: 0 % (ref 0.0–0.2)
nRBC: 0 % (ref 0.0–0.2)

## 2018-06-16 LAB — HEPATITIS B SURFACE ANTIGEN: Hepatitis B Surface Ag: NEGATIVE

## 2018-06-16 MED ORDER — LIDOCAINE-PRILOCAINE 2.5-2.5 % EX CREA
1.0000 "application " | TOPICAL_CREAM | CUTANEOUS | Status: DC | PRN
  Filled 2018-06-16: qty 5

## 2018-06-16 MED ORDER — CHLORHEXIDINE GLUCONATE 0.12 % MT SOLN
15.0000 mL | Freq: Two times a day (BID) | OROMUCOSAL | Status: DC
  Administered 2018-06-16 – 2018-06-29 (×24): 15 mL via OROMUCOSAL
  Filled 2018-06-16 (×22): qty 15

## 2018-06-16 MED ORDER — SENNOSIDES-DOCUSATE SODIUM 8.6-50 MG PO TABS: 1.0000 | ORAL_TABLET | Freq: Every evening | ORAL | Status: DC | PRN

## 2018-06-16 MED ORDER — ALLOPURINOL 100 MG PO TABS
100.0000 mg | ORAL_TABLET | Freq: Every day | ORAL | Status: DC
  Administered 2018-06-17 – 2018-06-29 (×13): 100 mg via ORAL
  Filled 2018-06-16 (×13): qty 1

## 2018-06-16 MED ORDER — LIDOCAINE-PRILOCAINE 2.5-2.5 % EX CREA: 1.0000 "application " | TOPICAL_CREAM | CUTANEOUS | 0 refills | Status: DC | PRN

## 2018-06-16 MED ORDER — PROCHLORPERAZINE MALEATE 5 MG PO TABS
5.0000 mg | ORAL_TABLET | Freq: Four times a day (QID) | ORAL | Status: DC | PRN
  Administered 2018-06-20: 10 mg via ORAL
  Filled 2018-06-16: qty 2

## 2018-06-16 MED ORDER — ALTEPLASE 2 MG IJ SOLR: 2.0000 mg | Freq: Once | INTRAMUSCULAR | Status: DC | PRN

## 2018-06-16 MED ORDER — TRAMADOL HCL 50 MG PO TABS
50.0000 mg | ORAL_TABLET | Freq: Two times a day (BID) | ORAL | Status: DC | PRN
  Administered 2018-06-17 – 2018-06-28 (×9): 50 mg via ORAL
  Filled 2018-06-16 (×9): qty 1

## 2018-06-16 MED ORDER — ACETAMINOPHEN 325 MG PO TABS
325.0000 mg | ORAL_TABLET | ORAL | Status: DC | PRN
  Administered 2018-06-17 – 2018-06-27 (×8): 650 mg via ORAL
  Filled 2018-06-16 (×9): qty 2

## 2018-06-16 MED ORDER — PENTAFLUOROPROP-TETRAFLUOROETH EX AERO: 1.0000 "application " | INHALATION_SPRAY | CUTANEOUS | Status: DC | PRN

## 2018-06-16 MED ORDER — SODIUM CHLORIDE 0.9 % IV SOLN: 100.0000 mL | INTRAVENOUS | Status: DC | PRN

## 2018-06-16 MED ORDER — ENSURE ENLIVE PO LIQD: 237.0000 mL | Freq: Two times a day (BID) | ORAL | 12 refills | Status: DC

## 2018-06-16 MED ORDER — POLYETHYLENE GLYCOL 3350 17 G PO PACK: 17.0000 g | PACK | Freq: Every day | ORAL | Status: DC | PRN

## 2018-06-16 MED ORDER — CALCITRIOL 0.25 MCG PO CAPS: 0.2500 ug | ORAL_CAPSULE | Freq: Every day | ORAL | Status: DC

## 2018-06-16 MED ORDER — PENTAFLUOROPROP-TETRAFLUOROETH EX AERO: 1.0000 "application " | INHALATION_SPRAY | CUTANEOUS | 0 refills | Status: DC | PRN

## 2018-06-16 MED ORDER — PROCHLORPERAZINE 25 MG RE SUPP: 12.5000 mg | Freq: Four times a day (QID) | RECTAL | Status: DC | PRN

## 2018-06-16 MED ORDER — CHLORHEXIDINE GLUCONATE CLOTH 2 % EX PADS
6.0000 | MEDICATED_PAD | Freq: Every day | CUTANEOUS | Status: DC
  Administered 2018-06-16: 6 via TOPICAL

## 2018-06-16 MED ORDER — GABAPENTIN 300 MG PO CAPS
300.0000 mg | ORAL_CAPSULE | Freq: Every day | ORAL | Status: DC
  Administered 2018-06-17 – 2018-06-28 (×12): 300 mg via ORAL
  Filled 2018-06-16 (×12): qty 1

## 2018-06-16 MED ORDER — DOXAZOSIN MESYLATE 8 MG PO TABS
8.0000 mg | ORAL_TABLET | Freq: Every day | ORAL | Status: DC
  Administered 2018-06-16 – 2018-06-21 (×6): 8 mg via ORAL
  Filled 2018-06-16 (×7): qty 1

## 2018-06-16 MED ORDER — ENOXAPARIN SODIUM 40 MG/0.4ML ~~LOC~~ SOLN
40.0000 mg | SUBCUTANEOUS | Status: DC
  Administered 2018-06-16: 40 mg via SUBCUTANEOUS
  Filled 2018-06-16: qty 0.4

## 2018-06-16 MED ORDER — HEPARIN SODIUM (PORCINE) 5000 UNIT/ML IJ SOLN: 5000.0000 [IU] | Freq: Three times a day (TID) | INTRAMUSCULAR | Status: DC

## 2018-06-16 MED ORDER — BISACODYL 10 MG RE SUPP: 10.0000 mg | Freq: Every day | RECTAL | Status: DC | PRN

## 2018-06-16 MED ORDER — PANTOPRAZOLE SODIUM 40 MG PO TBEC
40.0000 mg | DELAYED_RELEASE_TABLET | Freq: Every day | ORAL | Status: DC
  Administered 2018-06-17 – 2018-06-29 (×13): 40 mg via ORAL
  Filled 2018-06-16 (×13): qty 1

## 2018-06-16 MED ORDER — ASPIRIN 81 MG PO CHEW: 81.0000 mg | CHEWABLE_TABLET | Freq: Every day | ORAL | Status: DC

## 2018-06-16 MED ORDER — TICAGRELOR 90 MG PO TABS
90.0000 mg | ORAL_TABLET | Freq: Two times a day (BID) | ORAL | Status: DC
  Administered 2018-06-16 – 2018-06-29 (×26): 90 mg via ORAL
  Filled 2018-06-16 (×26): qty 1

## 2018-06-16 MED ORDER — TRAMADOL HCL 50 MG PO TABS: 50.0000 mg | ORAL_TABLET | Freq: Two times a day (BID) | ORAL | Status: DC | PRN

## 2018-06-16 MED ORDER — CALCIUM CARBONATE 1250 (500 CA) MG PO TABS: 1.0000 | ORAL_TABLET | Freq: Every day | ORAL | Status: DC

## 2018-06-16 MED ORDER — LIDOCAINE HCL (PF) 1 % IJ SOLN: 5.0000 mL | INTRAMUSCULAR | Status: DC | PRN

## 2018-06-16 MED ORDER — HEPARIN SODIUM (PORCINE) 1000 UNIT/ML DIALYSIS
1000.0000 [IU] | INTRAMUSCULAR | Status: DC | PRN
  Filled 2018-06-16: qty 1

## 2018-06-16 MED ORDER — HEPARIN SODIUM (PORCINE) 1000 UNIT/ML DIALYSIS: 1000.0000 [IU] | INTRAMUSCULAR | Status: DC | PRN

## 2018-06-16 MED ORDER — ATORVASTATIN CALCIUM 20 MG PO TABS: 20.0000 mg | ORAL_TABLET | Freq: Every day | ORAL | Status: DC

## 2018-06-16 MED ORDER — GUAIFENESIN-DM 100-10 MG/5ML PO SYRP
5.0000 mL | ORAL_SOLUTION | Freq: Four times a day (QID) | ORAL | Status: DC | PRN
  Administered 2018-06-28: 10 mL via ORAL
  Filled 2018-06-16: qty 10

## 2018-06-16 MED ORDER — TRAZODONE HCL 50 MG PO TABS
25.0000 mg | ORAL_TABLET | Freq: Every evening | ORAL | Status: DC | PRN
  Administered 2018-06-17 – 2018-06-18 (×2): 50 mg via ORAL
  Filled 2018-06-16 (×2): qty 1

## 2018-06-16 MED ORDER — RENA-VITE PO TABS
1.0000 | ORAL_TABLET | Freq: Every day | ORAL | Status: DC
  Administered 2018-06-16 – 2018-06-28 (×13): 1 via ORAL
  Filled 2018-06-16 (×13): qty 1

## 2018-06-16 MED ORDER — LIDOCAINE HCL (PF) 1 % IJ SOLN: 5.0000 mL | INTRAMUSCULAR | 0 refills | Status: DC | PRN

## 2018-06-16 MED ORDER — ALUMINUM HYDROXIDE GEL 320 MG/5ML PO SUSP
10.0000 mL | ORAL | Status: DC | PRN
  Filled 2018-06-16: qty 30

## 2018-06-16 MED ORDER — RENA-VITE PO TABS: 1.0000 | ORAL_TABLET | Freq: Every day | ORAL | 0 refills | Status: DC

## 2018-06-16 MED ORDER — DARBEPOETIN ALFA 100 MCG/0.5ML IJ SOSY: 100.0000 ug | PREFILLED_SYRINGE | INTRAMUSCULAR | Status: DC

## 2018-06-16 MED ORDER — TICAGRELOR 90 MG PO TABS: 90.0000 mg | ORAL_TABLET | Freq: Two times a day (BID) | ORAL | Status: DC

## 2018-06-16 MED ORDER — LIDOCAINE-PRILOCAINE 2.5-2.5 % EX CREA: 1.0000 "application " | TOPICAL_CREAM | CUTANEOUS | Status: DC | PRN

## 2018-06-16 MED ORDER — DIPHENHYDRAMINE HCL 12.5 MG/5ML PO ELIX: 12.5000 mg | ORAL_SOLUTION | Freq: Four times a day (QID) | ORAL | Status: DC | PRN

## 2018-06-16 MED ORDER — VITAMIN D 25 MCG (1000 UNIT) PO TABS
5000.0000 [IU] | ORAL_TABLET | Freq: Every day | ORAL | Status: DC
  Administered 2018-06-17 – 2018-06-29 (×13): 5000 [IU] via ORAL
  Filled 2018-06-16 (×13): qty 5

## 2018-06-16 MED ORDER — PANTOPRAZOLE SODIUM 40 MG PO TBEC: 40.0000 mg | DELAYED_RELEASE_TABLET | Freq: Every day | ORAL | Status: DC

## 2018-06-16 MED ORDER — CHLORHEXIDINE GLUCONATE CLOTH 2 % EX PADS
6.0000 | MEDICATED_PAD | Freq: Every day | CUTANEOUS | Status: DC
  Administered 2018-06-18: 6 via TOPICAL

## 2018-06-16 MED ORDER — ASPIRIN 81 MG PO CHEW
81.0000 mg | CHEWABLE_TABLET | Freq: Every day | ORAL | Status: DC
  Administered 2018-06-17 – 2018-06-29 (×13): 81 mg via ORAL
  Filled 2018-06-16 (×13): qty 1

## 2018-06-16 MED ORDER — ATORVASTATIN CALCIUM 10 MG PO TABS
20.0000 mg | ORAL_TABLET | Freq: Every day | ORAL | Status: DC
  Administered 2018-06-17 – 2018-06-28 (×12): 20 mg via ORAL
  Filled 2018-06-16 (×12): qty 2

## 2018-06-16 MED ORDER — PROCHLORPERAZINE EDISYLATE 10 MG/2ML IJ SOLN: 5.0000 mg | Freq: Four times a day (QID) | INTRAMUSCULAR | Status: DC | PRN

## 2018-06-16 NOTE — Progress Notes (Signed)
Meredith Staggers, MD    Meredith Staggers, MD  Physician  Physical Medicine and Rehabilitation      Consult Note  Signed     Date of Service:  06/10/2018  4:06 PM         Related encounter: ED to Hosp-Admission (Current) from 06/08/2018 in [facility] All Collapse All            Expand widget buttonCollapse widget button    Show:Clear all   ManualTemplateCopied  Added by:     Guinevere Scarlet, MD   Hover for detailscustomization button                                                                                                                                                                                untitled image              Physical Medicine and Rehabilitation Consult        Reason for Consult: Stroke with left sided weakness and dysarthria  Referring Physician: Dr. Erlinda Hong        HPI: Jason Cotta Sr. is a 72 y.o. male with history of HTN, rectal cancer, neuropathy, severe pulmonary hypertension, CKD--s/p AVF, OSA; who was admitted on 06/08/2018 after found down in the bathroom with inability to move left side and severely dysarthric speech.  CTA/perfusion study revealed occluded right ICA, right ICA terminus and right MCA, occluded right vertebral artery and soft plaque with thrombus left vertebral artery origin with CT perfusion tight criteria favorable for endovascular reperfusion.  He underwent cerebral angiogram with rescue stent and T1C1 2B reperfusion.  Hypertension treated with Cleviprex stroke felt to be likely due to large vessel atherosclerosis but cardioembolic source needs to be ruled out.  30-day cardiac event  monitor recommended on outpatient basis.  BLE Dopplers negative for DVT.  MRI/MRA of brain done revealing acute right MCA infarcts greatest involvement in basal ganglia, moderate bilateral ICA origin stenosis, occluded distal right VA and severe proximal right P2 stenosis, 2 mm right cavernous ICA aneurysm. therapy evaluations initiated today and patient noted to be limited by severe dysarthria with dysphagia,  left-sided weakness, ataxia and poor awareness of deficits.  CIR recommended due to functional deficits done        Review of Systems   Constitutional: Negative for chills and fever.   HENT: Negative for hearing loss and tinnitus.  Eyes: Negative for blurred vision and double vision.   Respiratory: Negative for cough and shortness of breath.    Cardiovascular: Negative for chest pain, palpitations and leg swelling.   Gastrointestinal: Negative for constipation, heartburn and nausea.   Genitourinary: Negative for frequency and urgency.   Musculoskeletal: Positive for back pain.   Skin: Negative for rash.   Neurological: Positive for sensory change, speech change and focal weakness. Negative for dizziness and headaches.   Psychiatric/Behavioral: The patient is not nervous/anxious.               Past Medical History:    Diagnosis   Date    .   Anemia        .   Arthritis        .   Chronic renal insufficiency   07/31/2011    .   COPD (chronic obstructive pulmonary disease) ([place])            pt denies    .   Deficiency anemia   07/20/2012    .   Diverticulosis        .   Edema   09/21/2010        Qualifier: Diagnosis of  By: Jerold Coombe      .   ERECTILE DYSFUNCTION, MILD   05/11/2007        Qualifier: Diagnosis of  By: Sherren Mocha MD, Jory Ee     .   Exertional dyspnea   10/30/2015    .   GOUT   05/11/2007        Qualifier: Diagnosis of  By: Sherren Mocha MD, Jory Ee     .   Hereditary and  idiopathic peripheral neuropathy   09/14/2014    .   History of radiation therapy   02/21/13-03/31/13        rectum 50.4Gy total dose    .   Hypersomnia   10/30/2015    .   Hypertension        .   Lung nodule   10/30/2015    .   OAB (overactive bladder)   01/09/2016    .   Obesity (BMI 30.0-34.9)   10/30/2015    .   Obstructive sleep apnea   07/29/2010        HST 11/2015 AHI 54.  On autocpap  >> 10 cm     .   Peripheral edema   09/19/2013    .   rectal ca   dx'd 02/01/13        rectal. Radiation and chemotherapy- remains on chemotherapy-next tx. 08-22-13.     .   S/P partial lobectomy of lung   11/20/2015    .   S/P thoracotomy        .   SUI (stress urinary incontinence), male   01/09/2016                Past Surgical History:    Procedure   Laterality   Date    .   AV FISTULA PLACEMENT   Left   03/24/2016        Procedure: CREATION OF LEFT BRACIOCEPHALIC ARTERIOVENOUS (AV) FISTULA;  Surgeon: Elam Dutch, MD;  Location: [facility];  Service: Vascular;  Laterality: Left;    .   COLON SURGERY       05/20/2013    .   COLONOSCOPY       2010  [facility]    .   EUS   N/A   02/03/2013        Procedure: LOWER ENDOSCOPIC ULTRASOUND (EUS);  Surgeon: Milus Banister, MD;  Location: [facility];  Service: Endoscopy;  Laterality: N/A;    .   ILEOSTOMY CLOSURE   N/A   08/24/2013        Procedure: CLOSURE OF LOOP ILEOSTOMY ;  Surgeon: Leighton Ruff, MD;  Location: [facility];  Service: General;  Laterality: N/A;    .   LAPAROSCOPIC LOW ANTERIOR RESECTION   N/A   05/20/2013        Procedure: LAPAROSCOPIC LOW ANTERIOR RESECTION WITH SPLENIC FLEXURE MOBILIZATION;  Surgeon: Leighton Ruff, MD;  Location: [facility];  Service: General;  Laterality: N/A;    .   OSTOMY   N/A   05/20/2013        Procedure: diverting OSTOMY;  Surgeon: Leighton Ruff, MD;   Location: [facility];  Service: General;  Laterality: N/A;    .   RADIOLOGY WITH ANESTHESIA   N/A   06/08/2018        Procedure: IR WITH ANESTHESIA;  Surgeon: Radiologist, Medication, MD;  Location: [facility];  Service: Radiology;  Laterality: N/A;    .   VIDEO ASSISTED THORACOSCOPY (VATS)/WEDGE RESECTION   Left   11/20/2015        Procedure: VIDEO ASSISTED THORACOSCOPY (VATS)/LUNG RESECTION;  Surgeon: [name], MD;  Location: [facility];  Service: Thoracic;  Laterality: Left;    Marland Kitchen   VIDEO BRONCHOSCOPY   N/A   11/20/2015        Procedure: VIDEO BRONCHOSCOPY;  Surgeon: [name], MD;  [facility];  Service: Thoracic;  Laterality: N/A;                Family History    Problem   Relation   Age of Onset    .   Stroke   Mother        .   Hypertension   Mother        .   Stroke   Father        .   Hypertension   Father        .   Hypertension   Other        .   Colon cancer   Neg Hx              Social History:  Married. 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 He  reports that he quit smoking about 40 years ago. His smoking use included cigarettes. He has a 5.00 pack-year smoking history. He has never used smokeless tobacco. He reports that he used to drink alcohol. He reports that he does not use drugs.               Allergies    Allergen   Reactions    .   Nsaids   Other (See Comments)            Asked by surgeon to add this medication class as intolerance due to patients renal insufficiency.    .   Amlodipine   Other (See Comments)            Edema                 Medications Prior to Admission    Medication   Sig   Dispense   Refill    .  acetaminophen (TYLENOL) 325 MG tablet   Take 650 mg by mouth every 6 (six) hours as needed.            Marland Kitchen   allopurinol (ZYLOPRIM) 100 MG tablet   Take 1 tablet (100 mg total)  by mouth daily.   30 tablet   0    .   calcium carbonate (OSCAL) 1500 (600 Ca) MG TABS tablet   Take 600 mg of elemental calcium by mouth daily.            .   cholecalciferol (VITAMIN D) 1000 units tablet   Take 5,000 Units by mouth daily.            Marland Kitchen   doxazosin (CARDURA) 8 MG tablet   Take 1 tablet (8 mg total) by mouth daily.   90 tablet   0    .   furosemide (LASIX) 80 MG tablet   Take 1 tablet (80 mg total) by mouth daily.            Marland Kitchen   gabapentin (NEURONTIN) 300 MG capsule   Take 1 capsule (300 mg total) by mouth at bedtime. Take 300mg s morning and 600mg s at bedtime            .   KLOR-CON M20 20 MEQ tablet   Take 1 tablet (20 mEq total) by mouth daily.   90 tablet   1    .   loperamide (IMODIUM) 2 MG capsule   Take 4 mg by mouth as needed for diarrhea or loose stools.            .   metoprolol succinate (TOPROL-XL) 50 MG 24 hr tablet   Take 50 mg by mouth daily.            .   polycarbophil (FIBERCON) 625 MG tablet   Take 1,250 mg by mouth as needed.                   Home:  Home Living  Family/patient expects to be discharged to:: Private residence  Living Arrangements: Spouse/significant other  Available Help at Discharge: Family, Available 24 hours/day  Type of Home: House  Home Access: Stairs to enter  Technical brewer of Steps: 1  Entrance Stairs-Rails: None  Home Layout: One level  Bathroom Shower/Tub: Futures trader: Yes  Home Equipment: None   Lives With: Spouse   Functional History:  Prior Function  Level of Independence: Independent  Functional Status:   Mobility:  Bed Mobility  Overal bed mobility: Needs Assistance  Bed Mobility: Supine to Sit  Supine to sit: Mod assist  Transfers  Overall transfer level: Needs assistance  Transfers: Sit to/from Stand, Stand Pivot  Transfers  Sit to Stand: +2 physical assistance, Mod assist  Stand pivot transfers: Mod assist, +2 physical assistance  General transfer comment: Difficulty taking steps with LLE but did not appear to buckle.  Pivoted to 3N1 with mod assist due to posterior and left lateral lean.    Ambulation/Gait  Ambulation/Gait assistance: Mod assist, +2 physical assistance  Gait Distance (Feet): 2 Feet  Assistive device: 2 person hand held assist  Gait Pattern/deviations: Step-to pattern, Decreased step length - left, Decreased stride length, Decreased weight shift to right, Decreased stance time - right, Ataxic, Leaning posteriorly, Staggering left  General Gait Details: Pt having difficulty coordinating steps with left LE.  Needed assist and cues.  Weakness on left LEas well.  Decr postural stabilty overal.        ADL:  ADL  Overall ADL's : Needs assistance/impaired  Eating/Feeding: Minimal assistance  Eating/Feeding Details (indicate cue type and reason): spillage form L side aof mouth; cues for safety - pt tipping soup bowl up and drinkin gfrom bowl with spontaneous couhging and pt being unaware of soup spiiling from L side of mouth  Grooming: Minimal assistance, Sitting  Upper Body Bathing: Minimal assistance, Sitting  Lower Body Bathing: Moderate assistance, Sit to/from stand  Upper Body Dressing : Moderate assistance, Sitting  Lower Body Dressing: Maximal assistance, Sit to/from stand  Toilet Transfer: +2 for physical assistance, Moderate assistance, Stand-pivot, BSC  Toileting- Clothing Manipulation and Hygiene: Maximal assistance, Sit to/from stand  Functional mobility during ADLs: Moderate assistance, +2 for physical assistance     Cognition:  Cognition  Overall Cognitive Status: Impaired/Different from baseline  Arousal/Alertness: Awake/alert  Orientation Level: Oriented X4  Attention: Focused, Sustained, Selective  Focused Attention: Appears  intact  Sustained Attention: Appears intact  Selective Attention: Appears intact  Memory: Impaired  Memory Impairment: Decreased recall of new information  Awareness: Impaired  Awareness Impairment: Emergent impairment  Problem Solving: Impaired  Problem Solving Impairment: Verbal complex, Functional complex  Executive Function: Reasoning  Reasoning: Impaired  Reasoning Impairment: Verbal basic  Behaviors: Impulsive  Safety/Judgment: Appears intact  Cognition  Arousal/Alertness: Lethargic  Behavior During Therapy: Flat affect  Overall Cognitive Status: Impaired/Different from baseline  Area of Impairment: Safety/judgement, Awareness, Problem solving, Attention  Current Attention Level: Selective  Safety/Judgement: Decreased awareness of safety, Decreased awareness of deficits  Awareness: Emergent  Problem Solving: Slow processing        Blood pressure (!) 103/46, pulse 66, temperature 98.4 F (36.9 C), temperature source Oral, resp. rate 14, height 5\' 8"  (1.727 m), weight 83.9 kg, SpO2 100 %.  Physical Exam   Nursing note and vitals reviewed.  Constitutional: He is oriented to person, place, and time. He appears well-developed and well-nourished. He appears lethargic. He is easily aroused. No distress.   HENT:   Head: Atraumatic.   Eyes: Pupils are equal, round, and reactive to light.   Neck: Normal range of motion.   Cardiovascular: Normal rate.   Respiratory: Effort normal.   GI: Soft.  Neurological: He is oriented to person, place, and time and easily aroused. He appears lethargic.  Left facial weakness with moderate dysarthria. Needed cues to stay awake but able to follow simple motor commands without difficulty. Left inattention but tracks to left. LUE 4/5. LLE 2/5. RUE 5/5. RLE 3+ to 4-/5. No focal sensory deficits   Skin:  Chronic changes in both LE's, scattered areas of skin breakdown         Lab Results Last 24 Hours  Imaging Results (Last 48 hours)                                                                         Assessment/Plan:  Diagnosis: Right MCA infarct with left hemiparesis and inattention  1.Does the need for close, 24 hr/day medical supervision in concert with the patient's rehab needs make it unreasonable for this patient to be served in a less intensive setting? Yes   2.Co-Morbidities requiring supervision/potential complications: HTN, pulmonary htn, ckd, peripheral neuropathy   3.Due to bladder management, bowel management, safety, skin/wound care, disease management, medication administration, pain management and patient education, does the patient require 24 hr/day rehab nursing? Yes   4.Does the patient require coordinated care of a physician, rehab nurse, PT (1-2 hrs/day, 5 days/week), OT (1-2 hrs/day, 5 days/week) and SLP (1-2 hrs/day, 5 days/week) to address physical and functional deficits in the context of the above medical diagnosis(es)? Yes Addressing deficits in the following areas: balance, endurance, locomotion, strength, transferring, bowel/bladder control, bathing, dressing, feeding, grooming, toileting, cognition, speech, swallowing and psychosocial support   5.Can the patient actively participate in an intensive therapy program of at least 3 hrs of therapy per day  at least 5 days per week? Yes   6.The potential for patient to make measurable gains while on inpatient rehab is excellent   7.Anticipated functional outcomes upon discharge from inpatient rehab are supervision  with PT, supervision and min assist with OT, modified independent and supervision with SLP.   8.Estimated rehab length of stay to reach the above functional goals is: 15-21 days   9.Anticipated D/C setting: Home   10.Anticipated post D/C treatments: [facility] therapy   11.Overall Rehab/Functional Prognosis: excellent      RECOMMENDATIONS:  This patient's condition is appropriate for continued rehabilitative care in the following setting: CIR  Patient has agreed to participate in recommended program. Yes  Note that insurance prior authorization may be required for reimbursement for recommended care.     Comment: Rehab Admissions Coordinator to follow up.     Thanks,     Meredith Staggers, MD, Jason [name]     I have personally performed a face to face diagnostic evaluation of this patient. Additionally, I have reviewed and concur with the physician assistant's documentation above.       Jason Leriche, PA-C  06/10/2018                Revision History                                        Routing History

## 2018-06-16 NOTE — Progress Notes (Signed)
[facility] ROUNDING NOTE   Subjective:   Tolerated dialysis 06/15/2018.Marland Kitchen  Appreciate Dr. Servando Snare placement of stent left cephalic arch 16/07/958     removal 2.2 l  Blood pressure 132/68 pulse 104 temperature 99.3 O2 sats 100% room air  Sodium 129 potassium 5.0 chloride 96 CO2 21 glucose 94 BUN 105 creatinine 9.19 calcium 9.8 phosphorus 5.1 albumin 2.6 WBC 9.1 hemoglobin 7.3 platelets 209   Objective:  Vital signs in last 24 hours:  Temp:  [98 F (36.7 C)-99.3 F (37.4 C)] 99.3 F (37.4 C) (12/11 0729) Pulse Rate:  [99-129] 104 (12/11 0729) Resp:  [9-20] 18 (12/11 0729) BP: (79-148)/(41-79) 132/68 (12/11 0729) SpO2:  [97 %-100 %] 100 % (12/11 0729) Weight:  [65.1 kg-67.3 kg] 65.1 kg (12/11 0005)  Weight change: -3.8 kg Filed Weights   06/15/18 0401 06/15/18 2106 06/16/18 0005  Weight: 71.1 kg 67.3 kg 65.1 kg    Intake/Output: I/O last 3 completed shifts: In: 840 [P.O.:840] Out: 2916 [Urine:700; Other:2216]   Intake/Output this shift:  No intake/output data recorded.  CVS- RRR RS- CTA ABD- BS present soft non-distended EXT-left upper extremity AV fistula tortuous   Basic Metabolic Panel: Recent Labs  Lab 06/11/18 1854 06/12/18 0511 06/13/18 0539 06/14/18 0427 06/15/18 2155  NA 131* 133* 133* 133* 129*  K 4.6 5.0 5.4* 5.9* 5.0  CL 97* 98 97* 98 96*  CO2 18* 17* 20* 21* 21*  GLUCOSE 105* 92 86 90 94  BUN 95* 104* 107* 114* 105*  CREATININE 9.83* 10.53* 10.47* 10.16* 9.19*  CALCIUM 9.0 9.3 9.5 9.5 9.8  PHOS 5.1*  --   --   --  5.1*    Liver Function Tests: Recent Labs  Lab 06/11/18 1854 06/15/18 2155 06/15/18 2156  ALT  --   --  12  ALBUMIN 2.6* 2.6*  --    No results for input(s): LIPASE, AMYLASE in the last 168 hours. No results for input(s): AMMONIA in the last 168 hours.  CBC: Recent Labs  Lab 06/11/18 0439 06/11/18 1513 06/12/18 0511 06/13/18 0539 06/14/18 0427 06/15/18 0540  WBC 10.8*  --  9.2 8.6 9.5 9.1  HGB  7.1* 7.0* 6.7* 8.1* 7.7* 7.3*  HCT 21.7* 21.2* 21.1* 25.7* 23.7* 22.6*  MCV 84.8  --  85.1 86.2 84.6 85.9  PLT 119*  --  126* 129* 146* 209    Cardiac Enzymes: No results for input(s): CKTOTAL, CKMB, CKMBINDEX, TROPONINI in the last 168 hours.  BNP: Invalid input(s): POCBNP  CBG: No results for input(s): GLUCAP in the last 168 hours.  Microbiology: Results for orders placed or performed during the hospital encounter of 06/08/18  MRSA PCR Screening     Status: None   Collection Time: 06/08/18  9:46 PM  Result Value Ref Range Status   MRSA by PCR NEGATIVE NEGATIVE Final    Comment:        The GeneXpert MRSA Assay (FDA approved for NASAL specimens only), is one component of a comprehensive MRSA colonization surveillance program. It is not intended to diagnose MRSA infection nor to guide or monitor treatment for MRSA infections. Performed at [facility], [place] [address] [place], [place] [address]     Coagulation Studies: No results for input(s): LABPROT, INR in the last 72 hours.  Urinalysis: No results for input(s): COLORURINE, LABSPEC, PHURINE, GLUCOSEU, HGBUR, BILIRUBINUR, KETONESUR, PROTEINUR, UROBILINOGEN, NITRITE, LEUKOCYTESUR in the last 72 hours.  Invalid input(s): APPERANCEUR    Imaging: No results found.  Medications:   . sodium chloride    . sodium chloride     .  stroke: mapping our early stages of recovery book   Does not apply Once  . allopurinol  100 mg Oral Daily  . aspirin  81 mg Oral Daily  . atorvastatin  20 mg Oral q1800  . calcitRIOL  0.25 mcg Oral Daily  . calcium carbonate  1 tablet Oral Q breakfast  . chlorhexidine  15 mL Mouth Rinse BID  . Chlorhexidine Gluconate Cloth  6 each Topical Q0600  . Chlorhexidine Gluconate Cloth  6 each Topical Q0600  . cholecalciferol  5,000 Units Oral Daily  . darbepoetin (ARANESP) injection - DIALYSIS  100 mcg Intravenous Q Tue-HD  . doxazosin  8 mg Oral Daily  . feeding supplement (ENSURE  ENLIVE)  237 mL Oral BID BM  . gabapentin  300 mg Oral TID  . heparin injection (subcutaneous)  5,000 Units Subcutaneous Q8H  . mouth rinse  15 mL Mouth Rinse q12n4p  . multivitamin  1 tablet Oral QHS  . pantoprazole  40 mg Oral Daily  . ticagrelor  90 mg Oral BID   sodium chloride, sodium chloride, acetaminophen **OR** acetaminophen (TYLENOL) oral liquid 160 mg/5 mL **OR** acetaminophen, alteplase, heparin, lidocaine (PF), lidocaine-prilocaine, pentafluoroprop-tetrafluoroeth, senna-docusate, traMADol  Assessment/ Plan:   Right MCA infarct due to right ICA and MCA occlusion status post IR rescue stents with reperfusion.  Dilated cardiomyopathy ejection fraction 20 to 25%  End-stage renal disease stage V status post stent to cephalic artery  Hyperkalemia stable received dialysis 06/15/2018 next dialysis treatment 65/46/5035  Metabolic bone disease PTH 161 we will start Rocaltrol 0.25 mcg daily  Anemia iron saturations 20% started darbepoetin  History of rectal cancer status post radiation and chemotherapy 2014  History of COPD  Status post revision stent to AV graft 06/14/2018   LOS: [place] @TODAY @10 :52 AM

## 2018-06-16 NOTE — PMR Pre-admission (Addendum)
PMR Admission Coordinator Pre-Admission Assessment  Patient: Jason VANDAM Sr. is an 72 y.o., male MRN: [id number] DOB: October 29, 1945 Height: 5\' 8"  (172.7 cm) Weight: 65.1 kg              Insurance Information HMO:     PPO: yes     PCP:      IPA:      80/20:      OTHER: Medicare advantage plan PRIMARY: [place] [employer]      Policy#: [id number]      Subscriber: pt CM Name: Abigail Butts      Phone#: [phone]     Fax#: [phone] Pre-Cert#: [id number] f/u in 7 days with Dorthula Nettles      Employer: disabled Benefits:  Phone #: [phone]     Name: 06/15/2018 Eff. Date: 07/07/2017     Deduct: none      Out of Pocket Max: $4500      Life Max: none CIR: $345 co pay per day days 1 until 5      SNF: no co pay days 1 until 20; $160 co pay per day days 21 until 49; no co pay days 50 until 100 Outpatient: $40 co pay per visit     Co-Pay: visits per medical neccesity Home Health: 100%      Co-Pay: visits per medical neccesity DME: 80%     Co-Pay: 20% Providers: in network  SECONDARY: none     Medicaid Application Date:       Case Manager:  Disability Application Date:       Case Worker:   Emergency Contact Information Contact Information    Name Relation Home Work [place] Spouse [phone]  [phone]   Marshel, Golubski   [phone]   Jedrek, Dinovo   [phone]     Current Medical History  Patient Admitting Diagnosis:  Right MCA infarct  History of Present Illness: Jason [name] is a 72 year old male with history of HTN, rectal cancer, neuropathy, severe pulmonary HTN, CKD s/p AVF, OSA; who was admitted on 06/08/18 with left sided weakness and severely dysarthric speech. CT/perfusion study showed occluded right ICA, right ICA terminus and MCA, occluded right vertebral artery and soft plaque with thrombus left vertebral artery at origin.  He underwent cerebral angiogram with rescue stent and T1c went to be reperfusion.  Elevated blood pressures treated with  Celebrex and stroke felt to be due to large vessel atherosclerosis.  30-day cardiac event monitor recommended to rule out A. fib as cause.  BLE Dopplers negative for DVT.  MRI/MRA brain done revealing acute right MCA infarct greatest involvement in basal ganglia.  Patient placed on aspirin and Brilinta due to intracranial stents.  2D echo showed EF 20 to 25% consistent with dilated cardiomyopathy. Nephrology following for input and hyperkalemia treated with [facility] without improvement.  Due to difficulty cannulating AV fistula, patient underwent fistulogram with placement of stent in left cephalic arch stenosis. Patient now with ESRD and HD to manage hyperkalemia.  Patient with resultant left sided weakness with dysarthria, delayed processing with poor awareness of deficits.   Complete NIHSS TOTAL: 1    Past Medical History  Past Medical History:  Diagnosis Date  . Anemia   . Arthritis   . Chronic renal insufficiency 07/31/2011  . COPD (chronic obstructive pulmonary disease) ([place])    pt denies  . Deficiency anemia 07/20/2012  . Diverticulosis   . Edema 09/21/2010   Qualifier: Diagnosis of  By:  [place] DO, Kendrick Fries    . ERECTILE DYSFUNCTION, MILD 05/11/2007   Qualifier: Diagnosis of  By: Sherren Mocha MD, Jory Ee   . Exertional dyspnea 10/30/2015  . GOUT 05/11/2007   Qualifier: Diagnosis of  By: Sherren Mocha MD, Jory Ee   . Hereditary and idiopathic peripheral neuropathy 09/14/2014  . History of radiation therapy 02/21/13-03/31/13   rectum 50.4Gy total dose  . Hypersomnia 10/30/2015  . Hypertension   . Lung nodule 10/30/2015  . OAB (overactive bladder) 01/09/2016  . Obesity (BMI 30.0-34.9) 10/30/2015  . Obstructive sleep apnea 07/29/2010   HST 11/2015 AHI 54.  On autocpap  >> 10 cm   . Peripheral edema 09/19/2013  . rectal ca dx'd 02/01/13   rectal. Radiation and chemotherapy- remains on chemotherapy-next tx. 08-22-13.   . S/P partial lobectomy of lung 11/20/2015  . S/P thoracotomy   . SUI (stress urinary  incontinence), male 01/09/2016    Family History  family history includes Hypertension in his father, mother, and other; Stroke in his father and mother.  Prior Rehab/Hospitalizations:  Has the patient had major surgery during 100 days prior to admission? No  Current Medications   Current Facility-Administered Medications:  .   stroke: mapping our early stages of recovery book, , Does not apply, Once, Waynetta Sandy, MD .  0.9 %  sodium chloride infusion, 100 mL, Intravenous, PRN, Edrick Oh, MD .  0.9 %  sodium chloride infusion, 100 mL, Intravenous, PRN, Edrick Oh, MD .  acetaminophen (TYLENOL) tablet 650 mg, 650 mg, Oral, Q4H PRN **OR** acetaminophen (TYLENOL) solution 650 mg, 650 mg, Per Tube, Q4H PRN **OR** acetaminophen (TYLENOL) suppository 650 mg, 650 mg, Rectal, Q4H PRN, Waynetta Sandy, MD .  allopurinol (ZYLOPRIM) tablet 100 mg, 100 mg, Oral, Daily, Waynetta Sandy, MD, 100 mg at 06/16/18 1022 .  alteplase (CATHFLO ACTIVASE) injection 2 mg, 2 mg, Intracatheter, Once PRN, Edrick Oh, MD .  aspirin chewable tablet 81 mg, 81 mg, Oral, Daily, 81 mg at 06/16/18 1022 **OR** [DISCONTINUED] aspirin chewable tablet 81 mg, 81 mg, Per Tube, Daily, Deveshwar, Sanjeev, MD, 81 mg at 06/09/18 0927 .  atorvastatin (LIPITOR) tablet 20 mg, 20 mg, Oral, q1800, Waynetta Sandy, MD, 20 mg at 06/15/18 1642 .  calcitRIOL (ROCALTROL) capsule 0.25 mcg, 0.25 mcg, Oral, Daily, Waynetta Sandy, MD, 0.25 mcg at 06/16/18 1022 .  calcium carbonate (OS-CAL - dosed in mg of elemental calcium) tablet 500 mg of elemental calcium, 1 tablet, Oral, Q breakfast, Waynetta Sandy, MD, 500 mg of elemental calcium at 06/16/18 1023 .  chlorhexidine (PERIDEX) 0.12 % solution 15 mL, 15 mL, Mouth Rinse, BID, Waynetta Sandy, MD, 15 mL at 06/16/18 1025 .  Chlorhexidine Gluconate Cloth 2 % PADS 6 each, 6 each, Topical, Q0600, Edrick Oh, MD, 6 each at  06/16/18 1133 .  cholecalciferol (VITAMIN D3) tablet 5,000 Units, 5,000 Units, Oral, Daily, Waynetta Sandy, MD, 5,000 Units at 06/16/18 1022 .  Darbepoetin Alfa (ARANESP) injection 100 mcg, 100 mcg, Intravenous, Samuel Germany, MD, 100 mcg at 06/15/18 2249 .  doxazosin (CARDURA) tablet 8 mg, 8 mg, Oral, Daily, Waynetta Sandy, MD, 8 mg at 06/16/18 1022 .  feeding supplement (ENSURE ENLIVE) (ENSURE ENLIVE) liquid 237 mL, 237 mL, Oral, BID BM, Waynetta Sandy, MD, 237 mL at 06/15/18 1641 .  gabapentin (NEURONTIN) capsule 300 mg, 300 mg, Oral, TID, Waynetta Sandy, MD, 300 mg at 06/16/18 1022 .  heparin injection 1,000 Units, 1,000 Units,  Dialysis, PRN, Edrick Oh, MD .  heparin injection 5,000 Units, 5,000 Units, Subcutaneous, Q8H, Waynetta Sandy, MD, 5,000 Units at 06/16/18 0543 .  lidocaine (PF) (XYLOCAINE) 1 % injection 5 mL, 5 mL, Intradermal, PRN, Edrick Oh, MD .  lidocaine-prilocaine (EMLA) cream 1 application, 1 application, Topical, PRN, Edrick Oh, MD .  MEDLINE mouth rinse, 15 mL, Mouth Rinse, q12n4p, Waynetta Sandy, MD, 15 mL at 06/15/18 1642 .  multivitamin (RENA-VIT) tablet 1 tablet, 1 tablet, Oral, QHS, Waynetta Sandy, MD, 1 tablet at 06/16/18 0037 .  pantoprazole (PROTONIX) EC tablet 40 mg, 40 mg, Oral, Daily, Waynetta Sandy, MD, 40 mg at 06/16/18 1022 .  pentafluoroprop-tetrafluoroeth (GEBAUERS) aerosol 1 application, 1 application, Topical, PRN, Edrick Oh, MD .  senna-docusate (Senokot-S) tablet 1 tablet, 1 tablet, Oral, QHS PRN, Waynetta Sandy, MD .  ticagrelor [facility]) tablet 90 mg, 90 mg, Oral, BID, 90 mg at 06/16/18 1026 **OR** [DISCONTINUED] ticagrelor (BRILINTA) tablet 90 mg, 90 mg, Per Tube, BID, Deveshwar, Sanjeev, MD, 90 mg at 06/09/18 0601 .  traMADol (ULTRAM) tablet 50 mg, 50 mg, Oral, Q12H PRN, Waynetta Sandy, MD, 50 mg at 06/16/18 1201  Patients  Current Diet:  Diet Order            Diet renal/carb modified with fluid restriction Diet-HS Snack? Nothing; Fluid restriction: 1200 mL Fluid; Room service appropriate? Yes; Fluid consistency: Thin  Diet effective now              Precautions / Restrictions Precautions Precautions: Fall Precaution Comments: L inattention Restrictions Weight Bearing Restrictions: No   Has the patient had 2 or more falls or a fall with injury in the past year?No  Prior Activity Level Community (5-7x/wk): independent and active  Development worker, international aid / [place] Devices/Equipment: None Home Equipment: None  Prior Device Use: Indicate devices/aids used by the patient prior to current illness, exacerbation or injury? None of the above  Prior Functional Level Prior Function Level of Independence: Independent  Self Care: Did the patient need help bathing, dressing, using the toilet or eating?  Independent  Indoor Mobility: Did the patient need assistance with walking from room to room (with or without device)? Independent  Stairs: Did the patient need assistance with internal or external stairs (with or without device)? Independent  Functional Cognition: Did the patient need help planning regular tasks such as shopping or remembering to take medications? Independent  Current Functional Level Cognition  Arousal/Alertness: Awake/alert Overall Cognitive Status: Impaired/Different from baseline Current Attention Level: Selective Orientation Level: Oriented to person, Oriented to place, Oriented to situation, Disoriented to time Safety/Judgement: Decreased awareness of safety, Decreased awareness of deficits General Comments: Pt's wife was present this session Attention: Focused, Sustained, Selective Focused Attention: Appears intact Sustained Attention: Appears intact Selective Attention: Appears intact Memory: Impaired Memory Impairment: Decreased recall of new  information Awareness: Impaired Awareness Impairment: Emergent impairment Problem Solving: Impaired Problem Solving Impairment: Verbal complex, Functional complex Executive Function: Reasoning Reasoning: Impaired Reasoning Impairment: Verbal basic Behaviors: Impulsive Safety/Judgment: Appears intact    Extremity Assessment (includes Sensation/Coordination)  Upper Extremity Assessment: Defer to OT evaluation LUE Deficits / Details: Moving LUE voluntarily with difficulty; AROM overall WFL; generalized weakness; poor in-hand manipulatiaons skills but attempting to use functionally; Brunstrom stage V (independencen from basic synergies) LUE Coordination: decreased fine motor, decreased gross motor  Lower Extremity Assessment: LLE deficits/detail LLE Deficits / Details: hip 3-/5, knee 4/5, ankle 3/5 LLE Sensation: decreased light touch, decreased proprioception  LLE Coordination: decreased gross motor    ADLs  Overall ADL's : Needs assistance/impaired Eating/Feeding: Supervision/ safety, Set up, Sitting Eating/Feeding Details (indicate cue type and reason): pt eating breakfast upon arrival, agreeable to transition to recliner for increased ease of performing task; setup for open of drink contatiners end of session  Grooming: Wash/dry hands, Wash/dry face, Set up, Sitting, Bed level Grooming Details (indicate cue type and reason): pt requesting towel to wash hands from breakfast; cues to wash face as pt also with food around mouth Upper Body Bathing: Minimal assistance, Sitting Lower Body Bathing: Moderate assistance, Sit to/from stand Upper Body Dressing : Moderate assistance, Sitting Lower Body Dressing: Maximal assistance, Sit to/from stand Toilet Transfer: +2 for physical assistance, Moderate assistance, Stand-pivot, BSC Toileting- Clothing Manipulation and Hygiene: Maximal assistance, Sit to/from stand Functional mobility during ADLs: Minimal assistance, Moderate assistance, Rolling  walker(stand pivot transfer) General ADL Comments: pt agreeable to getting OOB to eat breakfast, assisted with transfer to recliner and setup in optimal position to continue eating    Mobility  Overal bed mobility: Needs Assistance Bed Mobility: Supine to Sit Rolling: Min guard Supine to sit: Min guard General bed mobility comments: Pt required increased time with heavy reliance of railing to assist into sitting edge of bed.  To return to bed he required cues for boosting to [facility] for improved positioning.      Transfers  Overall transfer level: Needs assistance Equipment used: Rolling walker (2 wheeled) Transfers: Sit to/from Stand Sit to Stand: Mod assist Stand pivot transfers: Mod assist, Min assist General transfer comment: Pt continues to require cues for safe hand placement and to avoid pulling into standing with use of device.  Pt presents with posterior bias in standing and required cues to steady and maintain balance.      Ambulation / Gait / Stairs / Wheelchair Mobility  Ambulation/Gait Ambulation/Gait assistance: Min assist, Mod assist(Min assistance for forward ambulation remains to require mod assistance for turns and for backing.  ) Gait Distance (Feet): 55 Feet Assistive device: Rolling walker (2 wheeled) Gait Pattern/deviations: Step-to pattern, Decreased step length - left, Decreased stride length, Decreased weight shift to right, Decreased stance time - right, Ataxic, Leaning posteriorly, Staggering left, Trunk flexed General Gait Details: Pt continues to require assistance to elevate posture and increased hip extension.  Cues for RW positioning during turns as he continues to have difficulty keeping his feet in RW when turning.  LOB when turning.      Posture / Balance Dynamic Sitting Balance Sitting balance - Comments: minguard for safety with sitting balance, noticed slight posterior lean Balance Overall balance assessment: Needs assistance Sitting-balance support:  Bilateral upper extremity supported, Feet supported Sitting balance-Leahy Scale: Fair Sitting balance - Comments: minguard for safety with sitting balance, noticed slight posterior lean Postural control: Posterior lean Standing balance support: Bilateral upper extremity supported, During functional activity Standing balance-Leahy Scale: Poor Standing balance comment: reliant on external support and bil UE support    Special needs/care consideration BiPAP/CPAP pta CPM n/a Continuous Drip IV n/a Dialysis New ESRD to hemodialysis this admit. First treatment 12/10 Life Vest n/a Oxygen n/a Special Bed n/a Trach Size n/a Wound Vac n/a Skin left AV fistula Bowel mgmt: continent LBM 12/9 Bladder mgmt: external catheter Diabetic mgmt Hb A1c 5.5 30 days cardiac event monitoring recommended New to hemodialysis this admission; sees Dr. Graylon Gunning outpt nephrologist; Clipp process pending to outpt dialysis center    Previous Home Environment Living Arrangements: Spouse/significant other  Lives With: Spouse Available Help at Discharge: Family, Available 24 hours/day Type of Home: House Home Layout: One level Home Access: Stairs to enter Entrance Stairs-Rails: None Entrance Stairs-Number of Steps: 1 Bathroom Shower/Tub: Optometrist: Yes How Accessible: Accessible via walker [place]: No  Discharge Living Setting Plans for Discharge Living Setting: Patient's home, Lives with (comment)(wife) Type of Home at Discharge: House Discharge Home Layout: One level Discharge Home Access: Stairs to enter Entrance Stairs-Rails: None Entrance Stairs-Number of Steps: 1 Discharge Bathroom Shower/Tub: Tub/shower unit Discharge Bathroom Toilet: Standard Discharge Bathroom Accessibility: Yes How Accessible: Accessible via walker Does the patient have any problems obtaining your medications?: No  Social/Family/Support Systems Patient  Roles: Spouse Contact Information: wife, [name] Anticipated Caregiver: wife Anticipated Ambulance person Information: see above Ability/Limitations of Caregiver: no limitations Caregiver Availability: 24/7 Discharge Plan Discussed with Primary Caregiver: Yes Is Caregiver In Agreement with Plan?: Yes Does Caregiver/Family have Issues with Lodging/Transportation while Pt is in Rehab?: No   Goals/Additional Needs Patient/Family Goal for Rehab: supervision PT, supervision to min assist OT. Mod I to sueprvision SLP Expected length of stay: ELOS 15 to 21 days Pt/Family Agrees to Admission and willing to participate: Yes Program Orientation Provided & Reviewed with Pt/Caregiver Including Roles  & Responsibilities: Yes  Decrease burden of Care through IP rehab admission: n/a  Possible need for SNF placement upon discharge: not anticipated  Patient Condition: This patient's medical and functional status has changed since the consult dated: 06/11/2018 in which the Rehabilitation Physician determined and documented that the patient's condition is appropriate for intensive rehabilitative care in an inpatient rehabilitation facility. See "History of Present Illness" (above) for medical update. Functional changes are: min to mod assist. Patient's medical and functional status update has been discussed with the Rehabilitation physician and patient remains appropriate for inpatient rehabilitation. Will admit to inpatient rehab today.  Preadmission Screen Completed By:  Cleatrice Burke, 06/16/2018 1:02 PM ______________________________________________________________________   Discussed status with Dr. Letta Pate on 06/16/2018 at  1311 and received telephone approval for admission today.  Admission Coordinator:  Cleatrice Burke, time 1311 Date 06/16/2018

## 2018-06-16 NOTE — Progress Notes (Signed)
Physical Therapy Treatment Patient Details Name: ABDULLOH ULLOM Sr. MRN: [id number] DOB: 12/22/1945 Today's Date: 06/16/2018    History of Present Illness 72 year old male with rectal cancer status post chemotherapy and radiation, CKD stage V, OSA who presented with slurred speech and left-sided weakness and found with right MCA infarct secondary to right ICA and MCA occlusion status post stent and reperfusion.      PT Comments    Pt supine awake in bed on arrival.  Pt eager to participate.  He is requring decreased assistance with forward ambulation but continues to require mod assistance with turns and backing.  Performed limited standing exercises to improve strength and balance.  Pt fatigued post session. Pt remains an excellent candidate for CIR placement.  I have discussed this with patient and his wife and they both agree to placement as they feel their needs will not be met at home.    Follow Up Recommendations  CIR;Supervision/Assistance - 24 hour     Equipment Recommendations  Other (comment)(TBA)    Recommendations for Other Services       Precautions / Restrictions Precautions Precautions: Fall Precaution Comments: L inattention Restrictions Weight Bearing Restrictions: No    Mobility  Bed Mobility Overal bed mobility: Needs Assistance Bed Mobility: Supine to Sit Rolling: Min guard   Supine to sit: Min guard     General bed mobility comments: Pt required increased time with heavy reliance of railing to assist into sitting edge of bed.  To return to bed he required cues for boosting to [facility] for improved positioning.    Transfers Overall transfer level: Needs assistance Equipment used: Rolling walker (2 wheeled) Transfers: Sit to/from Stand Sit to Stand: Mod assist         General transfer comment: Pt continues to require cues for safe hand placement and to avoid pulling into standing with use of device.  Pt presents with posterior bias in standing and  required cues to steady and maintain balance.    Ambulation/Gait Ambulation/Gait assistance: Min assist;Mod assist(Min assistance for forward ambulation remains to require mod assistance for turns and for backing.  ) Gait Distance (Feet): 55 Feet Assistive device: Rolling walker (2 wheeled) Gait Pattern/deviations: Step-to pattern;Decreased step length - left;Decreased stride length;Decreased weight shift to right;Decreased stance time - right;Ataxic;Leaning posteriorly;Staggering left;Trunk flexed     General Gait Details: Pt continues to require assistance to elevate posture and increased hip extension.  Cues for RW positioning during turns as he continues to have difficulty keeping his feet in RW when turning.  LOB when turning.     Stairs             Wheelchair Mobility    Modified Rankin (Stroke Patients Only)       Balance Overall balance assessment: Needs assistance   Sitting balance-Leahy Scale: Fair       Standing balance-Leahy Scale: Poor                              Cognition Arousal/Alertness: Awake/alert Behavior During Therapy: Flat affect Overall Cognitive Status: Impaired/Different from baseline Area of Impairment: Safety/judgement;Awareness;Problem solving;Attention                   Current Attention Level: Selective     Safety/Judgement: Decreased awareness of safety;Decreased awareness of deficits   Problem Solving: Slow processing General Comments: Pt's wife was present this session      Exercises General Exercises -  Lower Extremity Hip Flexion/Marching: AROM;Both;Standing Heel Raises: AROM;Both;5 reps;10 reps;Standing(also performed in sitting x10 reps due to poor ROM in standing due to calf weakness.  ) Mini-Sqauts: AROM;Both;5 reps;Standing("chair push-ups")    General Comments        Pertinent Vitals/Pain Pain Assessment: 0-10 Faces Pain Scale: Hurts little more Pain Location: low back Pain Descriptors /  Indicators: Sore;Discomfort Pain Intervention(s): Monitored during session;Repositioned    Home Living                      Prior Function            PT Goals (current goals can now be found in the care plan section) Acute Rehab PT Goals Patient Stated Goal: "To get better and go home" Potential to Achieve Goals: Good Progress towards PT goals: Progressing toward goals    Frequency    Min 4X/week      PT Plan Current plan remains appropriate    Co-evaluation              AM-PAC PT "6 Clicks" Mobility   Outcome Measure  Help needed turning from your back to your side while in a flat bed without using bedrails?: A Little Help needed moving from lying on your back to sitting on the side of a flat bed without using bedrails?: A Little Help needed moving to and from a bed to a chair (including a wheelchair)?: A Lot Help needed standing up from a chair using your arms (e.g., wheelchair or bedside chair)?: A Lot Help needed to walk in hospital room?: A Lot Help needed climbing 3-5 steps with a railing? : A Lot 6 Click Score: 14    End of Session Equipment Utilized During Treatment: Gait belt Activity Tolerance: Patient limited by fatigue Patient left: with call bell/phone within reach;with family/visitor present;in bed;with bed alarm set Nurse Communication: Mobility status PT Visit Diagnosis: Unsteadiness on feet (R26.81);Muscle weakness (generalized) (M62.81)     Time: 4818-5631 PT Time Calculation (min) (ACUTE ONLY): 20 min  Charges:  $Gait Training: 8-22 mins                     Jason [name], PTA Acute Rehabilitation Services Pager [phone] Office [phone]     Jason [name] Jason [name] 06/16/2018, 11:02 AM

## 2018-06-16 NOTE — H&P (Signed)
Physical Medicine and Rehabilitation Admission H&P        Chief Complaint  Patient presents with  . Stroke with functional deficits      HPI: Jason [name] is a 72 year old male with history of HTN, rectal cancer, neuropathy, severe pulmonary HTN, CKD s/p AVF, OSA; who was admitted on 06/08/18 with left sided weakness and severely dysarthric speech. CT/perfusion study showed occluded right ICA, right ICA terminus and MCA, occluded right vertebral artery and soft plaque with thrombus left vertebral artery at origin.  He underwent cerebral angiogram with rescue stent and T1c went to be reperfusion.  Elevated blood pressures treated with Celebrex and stroke felt to be due to large vessel atherosclerosis.  30-day cardiac event monitor recommended to rule out A. fib as cause.  BLE Dopplers negative for DVT.  MRI/MRA brain done revealing acute right MCA infarct greatest involvement in basal ganglia.  Patient placed on aspirin and Brilinta due to intracranial stents.   2D echo showed EF 20 to 25% consistent with dilated cardiomyopathy. Nephrology following for input and hyperkalemia treated with [facility] without improvement.  Due to difficulty cannulating AV fistula, patient underwent fistulogram with placement of stent in left cephalic arch stenosis. Patient now with ESRD and HD to manage hyperkalemia.  Patient with resultant left sided weakness with dysarthria, delayed processing with poor awareness of deficits. He  continues to have limitations in ADLs and mobility and CIR recommended follow-up therapy     Review of Systems  Constitutional: Negative for chills and fever.  HENT: Negative for hearing loss.   Eyes: Negative for blurred vision and double vision.  Respiratory: Negative for cough and shortness of breath.   Cardiovascular: Negative for chest pain, palpitations and leg swelling.  Gastrointestinal: Negative for abdominal pain, heartburn and nausea.  Musculoskeletal: Negative for back pain,  joint pain and myalgias.  Skin: Negative for itching and rash.  Neurological: Positive for speech change and focal weakness. Negative for dizziness and headaches.  Psychiatric/Behavioral: The patient is not nervous/anxious.           Past Medical History:  Diagnosis Date  . Anemia    . Arthritis    . Chronic renal insufficiency 07/31/2011  . COPD (chronic obstructive pulmonary disease) ([place])      pt denies  . Deficiency anemia 07/20/2012  . Diverticulosis    . Edema 09/21/2010    Qualifier: Diagnosis of  By: Jerold Coombe    . ERECTILE DYSFUNCTION, MILD 05/11/2007    Qualifier: Diagnosis of  By: Sherren Mocha MD, Jory Ee   . Exertional dyspnea 10/30/2015  . GOUT 05/11/2007    Qualifier: Diagnosis of  By: Sherren Mocha MD, Jory Ee   . Hereditary and idiopathic peripheral neuropathy 09/14/2014  . History of radiation therapy 02/21/13-03/31/13    rectum 50.4Gy total dose  . Hypersomnia 10/30/2015  . Hypertension    . Lung nodule 10/30/2015  . OAB (overactive bladder) 01/09/2016  . Obesity (BMI 30.0-34.9) 10/30/2015  . Obstructive sleep apnea 07/29/2010    HST 11/2015 AHI 54.  On autocpap  >> 10 cm   . Peripheral edema 09/19/2013  . rectal ca dx'd 02/01/13    rectal. Radiation and chemotherapy- remains on chemotherapy-next tx. 08-22-13.   . S/P partial lobectomy of lung 11/20/2015  . S/P thoracotomy    . SUI (stress urinary incontinence), male 01/09/2016           Past Surgical History:  Procedure Laterality Date  . A/V FISTULAGRAM Left  06/14/2018    Procedure: A/V FISTULAGRAM;  Surgeon: Waynetta Sandy, MD;  Location: [place] [facility];  Service: Cardiovascular;  Laterality: Left;  . AV FISTULA PLACEMENT Left 03/24/2016    Procedure: CREATION OF LEFT BRACIOCEPHALIC ARTERIOVENOUS (AV) FISTULA;  Surgeon: Elam Dutch, MD;  Location: [place];  Service: Vascular;  Laterality: Left;  . COLON SURGERY   05/20/2013  . COLONOSCOPY   2010    [facility]  . EUS N/A 02/03/2013    Procedure: LOWER  ENDOSCOPIC ULTRASOUND (EUS);  Surgeon: Milus Banister, MD;  Location: [facility];  Service: Endoscopy;  Laterality: N/A;  . ILEOSTOMY CLOSURE N/A 08/24/2013    Procedure: CLOSURE OF LOOP ILEOSTOMY ;  Surgeon: Leighton Ruff, MD;  Location: [facility];  Service: General;  Laterality: N/A;  . IR ANGIO VERTEBRAL SEL SUBCLAVIAN INNOMINATE BILAT MOD SED   06/08/2018  . IR CT HEAD LTD   06/08/2018  . IR INTRAVSC STENT CERV CAROTID W/O EMB-PROT MOD SED INC ANGIO   06/08/2018  . IR PERCUTANEOUS ART THROMBECTOMY/INFUSION INTRACRANIAL INC DIAG ANGIO   06/08/2018  . LAPAROSCOPIC LOW ANTERIOR RESECTION N/A 05/20/2013    Procedure: LAPAROSCOPIC LOW ANTERIOR RESECTION WITH SPLENIC FLEXURE MOBILIZATION;  Surgeon: Leighton Ruff, MD;  Location: [facility];  Service: General;  Laterality: N/A;  . OSTOMY N/A 05/20/2013    Procedure: diverting OSTOMY;  Surgeon: Leighton Ruff, MD;  Location: [facility];  Service: General;  Laterality: N/A;  . PERIPHERAL VASCULAR INTERVENTION Left 06/14/2018    Procedure: PERIPHERAL VASCULAR INTERVENTION;  Surgeon: Waynetta Sandy, MD;  Location: [place] [facility];  Service: Cardiovascular;  Laterality: Left;  AVF on the Left  . RADIOLOGY WITH ANESTHESIA N/A 06/08/2018    Procedure: IR WITH ANESTHESIA;  Surgeon: Radiologist, Medication, MD;  Location: [place];  Service: Radiology;  Laterality: N/A;  . VIDEO ASSISTED THORACOSCOPY (VATS)/WEDGE RESECTION Left 11/20/2015    Procedure: VIDEO ASSISTED THORACOSCOPY (VATS)/LUNG RESECTION;  Surgeon: [name], MD;  Location: [place];  Service: Thoracic;  Laterality: Left;  Marland Kitchen VIDEO BRONCHOSCOPY N/A 11/20/2015    Procedure: VIDEO BRONCHOSCOPY;  Surgeon: [name], MD;  Location: [facility];  Service: Thoracic;  Laterality: N/A;           Family History  Problem Relation Age of Onset  . Stroke Mother    . Hypertension Mother    . Stroke Father    . Hypertension Father    . Hypertension Other    . Colon cancer Neg Hx        Social  History:  Married. Independent PTA. He reports that he quit smoking about 40 years ago. His smoking use included cigarettes. He has a 5.00 pack-year smoking history. He has never used smokeless tobacco. He reports that he drinks alcohol. He reports that he does not use drugs.           Allergies  Allergen Reactions  . Nsaids Other (See Comments)      Asked by surgeon to add this medication class as intolerance due to patients renal insufficiency.  . Amlodipine Other (See Comments)      Edema              Medications Prior to Admission  Medication Sig Dispense Refill  . acetaminophen (TYLENOL) 325 MG tablet Take 650 mg by mouth every 6 (six) hours as needed.      Marland Kitchen allopurinol (ZYLOPRIM) 100 MG tablet Take 1 tablet (100 mg total) by mouth daily.  30 tablet 0  . calcium carbonate (OSCAL) 1500 (600 Ca) MG TABS tablet Take 600 mg of elemental calcium by mouth daily.      . cholecalciferol (VITAMIN D) 1000 units tablet Take 5,000 Units by mouth daily.      Marland Kitchen doxazosin (CARDURA) 8 MG tablet Take 1 tablet (8 mg total) by mouth daily. 90 tablet 0  . furosemide (LASIX) 80 MG tablet Take 1 tablet (80 mg total) by mouth daily.      Marland Kitchen gabapentin (NEURONTIN) 300 MG capsule Take 1 capsule (300 mg total) by mouth at bedtime. Take 300mg s morning and 600mg s at bedtime      . KLOR-CON M20 20 MEQ tablet Take 1 tablet (20 mEq total) by mouth daily. 90 tablet 1  . loperamide (IMODIUM) 2 MG capsule Take 4 mg by mouth as needed for diarrhea or loose stools.      . metoprolol succinate (TOPROL-XL) 50 MG 24 hr tablet Take 50 mg by mouth daily.      . polycarbophil (FIBERCON) 625 MG tablet Take 1,250 mg by mouth as needed.           Drug Regimen Review  Drug regimen was reviewed and remains appropriate with no significant issues identified   Home: Home Living Family/patient expects to be discharged to:: Private residence Living Arrangements: Spouse/significant other Available Help at Discharge: Family,  Available 24 hours/day Type of Home: House Home Access: Stairs to enter Technical brewer of Steps: 1 Entrance Stairs-Rails: None Home Layout: One level Bathroom Shower/Tub: Optometrist: Yes Home Equipment: None  Lives With: Spouse   Functional History: Prior Function Level of Independence: Independent   Functional Status:  Mobility: Bed Mobility Overal bed mobility: Needs Assistance Bed Mobility: Supine to Sit Rolling: Min guard Supine to sit: Min guard General bed mobility comments: Pt required increased time with heavy reliance of railing to assist into sitting edge of bed.  To return to bed he required cues for boosting to [facility] for improved positioning.   Transfers Overall transfer level: Needs assistance Equipment used: Rolling walker (2 wheeled) Transfers: Sit to/from Stand Sit to Stand: Mod assist Stand pivot transfers: Mod assist, Min assist General transfer comment: Pt continues to require cues for safe hand placement and to avoid pulling into standing with use of device.  Pt presents with posterior bias in standing and required cues to steady and maintain balance.   Ambulation/Gait Ambulation/Gait assistance: Min assist, Mod assist(Min assistance for forward ambulation remains to require mod assistance for turns and for backing.  ) Gait Distance (Feet): 55 Feet Assistive device: Rolling walker (2 wheeled) Gait Pattern/deviations: Step-to pattern, Decreased step length - left, Decreased stride length, Decreased weight shift to right, Decreased stance time - right, Ataxic, Leaning posteriorly, Staggering left, Trunk flexed General Gait Details: Pt continues to require assistance to elevate posture and increased hip extension.  Cues for RW positioning during turns as he continues to have difficulty keeping his feet in RW when turning.  LOB when turning.     ADL: ADL Overall ADL's : Needs  assistance/impaired Eating/Feeding: Supervision/ safety, Set up, Sitting Eating/Feeding Details (indicate cue type and reason): pt eating breakfast upon arrival, agreeable to transition to recliner for increased ease of performing task; setup for open of drink contatiners end of session  Grooming: Wash/dry hands, Wash/dry face, Set up, Sitting, Bed level Grooming Details (indicate cue type and reason): pt requesting towel to wash hands from breakfast; cues to wash  face as pt also with food around mouth Upper Body Bathing: Minimal assistance, Sitting Lower Body Bathing: Moderate assistance, Sit to/from stand Upper Body Dressing : Moderate assistance, Sitting Lower Body Dressing: Maximal assistance, Sit to/from stand Toilet Transfer: +2 for physical assistance, Moderate assistance, Stand-pivot, BSC Toileting- Clothing Manipulation and Hygiene: Maximal assistance, Sit to/from stand Functional mobility during ADLs: Minimal assistance, Moderate assistance, Rolling walker(stand pivot transfer) General ADL Comments: pt agreeable to getting OOB to eat breakfast, assisted with transfer to recliner and setup in optimal position to continue eating   Cognition: Cognition Overall Cognitive Status: Impaired/Different from baseline Arousal/Alertness: Awake/alert Orientation Level: Oriented to person, Oriented to place, Oriented to situation Attention: Focused, Sustained, Selective Focused Attention: Appears intact Sustained Attention: Appears intact Selective Attention: Appears intact Memory: Impaired Memory Impairment: Decreased recall of new information Awareness: Impaired Awareness Impairment: Emergent impairment Problem Solving: Impaired Problem Solving Impairment: Verbal complex, Functional complex Executive Function: Reasoning Reasoning: Impaired Reasoning Impairment: Verbal basic Behaviors: Impulsive Safety/Judgment: Appears intact Cognition Arousal/Alertness: Awake/alert Behavior During  Therapy: Flat affect Overall Cognitive Status: Impaired/Different from baseline Area of Impairment: Safety/judgement, Awareness, Problem solving, Attention Current Attention Level: Selective Safety/Judgement: Decreased awareness of safety, Decreased awareness of deficits Awareness: Emergent Problem Solving: Slow processing General Comments: Pt's wife was present this session     Blood pressure 110/62, pulse (!) 102, temperature 98.8 F (37.1 C), temperature source Oral, resp. rate 20, height 5\' 8"  (1.727 m), weight 68.9 kg, SpO2 100 %. Physical Exam  Nursing note and vitals reviewed. Constitutional: He appears well-developed and well-nourished. He is sleeping. He is easily aroused.  Neurological: He is easily aroused.  Alert today and interactive.  Speech more clear with moderate dysarthria but without oral secretions.      General: No acute distress Mood and affect are appropriate Heart: Regular rate and rhythm no rubs murmurs or extra sounds Lungs: Clear to auscultation, breathing unlabored, no rales or wheezes Abdomen: Positive bowel sounds, soft nontender to palpation, nondistended Extremities: No clubbing, cyanosis, or edema Skin: No evidence of breakdown, no evidence of rash Neurologic: Cranial nerves II through XII intact, motor strength is 4/5 in Right  deltoid, bicep, tricep, grip,bilateral  hip flexor, knee extensors, ankle dorsiflexor and plantar flexor 4/5 LLE, 4/5 Left grip , prox UE not tested , in HD Sensory exam reduced sensation to light touch in left foot  Musculoskeletal: No joint deformities No joint swelling   Lab Results Last 48 Hours        Results for orders placed or performed during the hospital encounter of 06/08/18 (from the past 48 hour(s))  CBC     Status: Abnormal    Collection Time: 06/15/18  5:40 AM  Result Value Ref Range    WBC 9.1 4.0 - 10.5 K/uL    RBC 2.63 (L) 4.22 - 5.81 MIL/uL    Hemoglobin 7.3 (L) 13.0 - 17.0 g/dL    HCT 22.6 (L) 39.0 -  52.0 %    MCV 85.9 80.0 - 100.0 fL    MCH 27.8 26.0 - 34.0 pg    MCHC 32.3 30.0 - 36.0 g/dL    RDW 17.4 (H) 11.5 - 15.5 %    Platelets 209 150 - 400 K/uL    nRBC 0.0 0.0 - 0.2 %      Comment: Performed at [facility], [address] [place], [place] [address]  Renal function panel     Status: Abnormal    Collection Time: 06/15/18  9:55 PM  Result Value Ref Range    Sodium 129 (L) 135 - 145 mmol/L      Comment: CORRECTED ON 12/10 AT 2254: PREVIOUSLY REPORTED AS 131    Potassium 5.0 3.5 - 5.1 mmol/L      Comment: CORRECTED ON 12/10 AT 2254: PREVIOUSLY REPORTED AS 5.1    Chloride 96 (L) 98 - 111 mmol/L      Comment: CORRECTED ON 12/10 AT 2254: PREVIOUSLY REPORTED AS 97    CO2 21 (L) 22 - 32 mmol/L      Comment: CORRECTED ON 12/10 AT 2254: PREVIOUSLY REPORTED AS 19    Glucose, Bld 94 70 - 99 mg/dL      Comment: CORRECTED ON 12/10 AT 2254: PREVIOUSLY REPORTED AS 95    BUN 105 (H) 8 - 23 mg/dL    Creatinine, Ser 9.19 (H) 0.61 - 1.24 mg/dL      Comment: CORRECTED ON 12/10 AT 2254: PREVIOUSLY REPORTED AS 9.30    Calcium 9.8 8.9 - 10.3 mg/dL      Comment: CORRECTED ON 12/10 AT 2254: PREVIOUSLY REPORTED AS 9.9    Phosphorus 5.1 (H) 2.5 - 4.6 mg/dL    Albumin 2.6 (L) 3.5 - 5.0 g/dL    GFR calc non Af Amer 5 (L) >60 mL/min    GFR calc Af Amer 6 (L) >60 mL/min    Anion gap 12 5 - 15      Comment: Performed at [facility], [place] [address], [place], [place] [address] CORRECTED ON 12/10 AT 2254: PREVIOUSLY REPORTED AS 15    Hepatitis B surface antigen     Status: None    Collection Time: 06/15/18  9:56 PM  Result Value Ref Range    Hepatitis B Surface Ag Negative Negative      Comment: (NOTE) Performed At: [facility] [place], [place] [id number] Rush Farmer MD [id number]    ALT     Status: None    Collection Time: 06/15/18  9:56 PM  Result Value Ref Range    ALT 12 0 - 44 U/L      Comment: Performed at [facility], [place] [address] [place], [place] [address]  Renal function panel     Status: Abnormal    Collection Time: 06/16/18 11:21 AM  Result Value Ref Range    Sodium 133 (L) 135 - 145 mmol/L    Potassium 4.6 3.5 - 5.1 mmol/L    Chloride 95 (L) 98 - 111 mmol/L    CO2 23 22 - 32 mmol/L    Glucose, Bld 100 (H) 70 - 99 mg/dL    BUN 79 (H) 8 - 23 mg/dL    Creatinine, Ser 7.48 (H) 0.61 - 1.24 mg/dL    Calcium 9.6 8.9 - 10.3 mg/dL    Phosphorus 4.3 2.5 - 4.6 mg/dL    Albumin 2.6 (L) 3.5 - 5.0 g/dL    GFR calc non Af Amer 7 (L) >60 mL/min    GFR calc Af Amer 8 (L) >60 mL/min    Anion gap 15 5 - 15      Comment: Performed at [facility], [address], [place] [address]  CBC     Status: Abnormal    Collection Time: 06/16/18 11:21 AM  Result Value Ref Range    WBC 13.8 (H) 4.0 - 10.5 K/uL    RBC 2.70 (L) 4.22 - 5.81 MIL/uL    Hemoglobin 7.3 (L) 13.0 -  17.0 g/dL    HCT 23.4 (L) 39.0 - 52.0 %    MCV 86.7 80.0 - 100.0 fL    MCH 27.0 26.0 - 34.0 pg    MCHC 31.2 30.0 - 36.0 g/dL    RDW 17.2 (H) 11.5 - 15.5 %    Platelets 184 150 - 400 K/uL    nRBC 0.0 0.0 - 0.2 %      Comment: Performed at [facility] [address] [place], [place] [address]      Imaging Results (Last 48 hours)  No results found.           Medical Problem List and Plan: 1.  Decrease mobility and ADLs secondary to Right MCA infarct 2.  DVT Prophylaxis/Anticoagulation: Pharmaceutical: Lovenox 3. Pain Management: Tylenol as needed 4. Mood: LCSW to follow for evaluation and support 5. Neuropsych: This patient is capable of making decisions on his own behalf. 6. Skin/Wound Care: Routine pressure relief measures 7. Fluids/Electrolytes/Nutrition: Monitor I's and O's.  Nephrology check lites in the 8. ESRD stage V: hyperkalemia to be treated with HD today. 9. Anemia of chronic disease: Started on darbepoetin for supplementation 10. HTN: Monitor bid. Continue Cardura.  11. Neuropathy: Decrease gabapentin to renal  dose--once daily. 12. OSA: Continue to use CPAP when napping or when asleep.      Post Admission Physician Evaluation: 1. Functional deficits secondary  to Right MCA infarct. 2. Patient admitted to receive collaborative, interdisciplinary care between the physiatrist, rehab nursing staff, and therapy team. 3. Patient's level of medical complexity and substantial therapy needs in context of that medical necessity cannot be provided at a lesser intensity of care. 4. Patient has experienced substantial functional loss from his/her baseline.  Judging by the patient's diagnosis, physical exam, and functional history, the patient has potential for functional progress which will result in measurable gains while on inpatient rehab.  These gains will be of substantial and practical use upon discharge in facilitating mobility and self-care at the household level. 5. Physiatrist will provide 24 hour management of medical needs as well as oversight of the therapy plan/treatment and provide guidance as appropriate regarding the interaction of the two. 6. 24 hour rehab nursing will assist in the management of  bladder management, bowel management, safety, skin/wound care, disease management, medication administration, pain management and patient education  and help integrate therapy concepts, techniques,education, etc. 7. PT will assess and treat for:pre gait, gait training, endurance , safety, equipment, neuromuscular re education  .  Goals are: supervision. 8. OT will assess and treat for ADLs, Cognitive perceptual skills, Neuromuscular re education, safety, endurance, equipment  .  Goals are: supervision.  9. SLP will assess and treat for dysarthria  .  Goals are: independent. 10. Case Management and Social Worker will assess and treat for psychological issues and discharge planning. 11. Team conference will be held weekly to assess progress toward goals and to determine barriers to discharge. 12.  Patient will  receive at least 3 hours of therapy per day at least 5 days per week. 13. ELOS and Prognosis: 10-14d excellent  "I have personally performed a face to face diagnostic evaluation of this patient.  Additionally, I have reviewed and concur with the physician assistant's documentation above."   Charlett Blake M.D. [facility] FAAPM&R (Sports Med, Neuromuscular Med) Diplomate Am Board of [place], PA-C 06/16/2018

## 2018-06-16 NOTE — Progress Notes (Signed)
Jason Gong, RN  Rehab Admission Coordinator  Physical Medicine and Rehabilitation  PMR Pre-admission  Attested Addendum  Date of Service:  06/16/2018 1:02 PM       Related encounter: ED to Hosp-Admission (Current) from 06/08/2018 in [place] signed by Jason Blake, MD at 06/16/2018 1:34 PM  Please note "blood pressures controlled with Cleviprex"   Otherwise agree with above       Show:Clear all [x] Manual[x] Template[x] Copied  Added by: [x] Jason Gong, RN  [] Hover for details PMR Admission Coordinator Pre-Admission Assessment  Patient: Jason BHAKTA Sr. is an 72 y.o., male MRN: [id number] DOB: 1946/06/18 Height: 5\' 8"  (172.7 cm) Weight: 65.1 kg                                                                                                                                                  Insurance Information HMO:     PPO: yes     PCP:      IPA:      80/20:      OTHER: Medicare advantage plan PRIMARY: [place] [employer]      Policy#: [id number]      Subscriber: Jason [name] CM Name: Jason [name]      Phone#: [phone]     Fax#: [phone] Pre-Cert#: [id number] f/u in 7 days with Jason [name]      Employer: disabled Benefits:  Phone #: [phone]     Name: 06/15/2018 Eff. Date: 07/07/2017     Deduct: none      Out of Pocket Max: $4500      Life Max: none CIR: $345 co pay per day days 1 until 5      SNF: no co pay days 1 until 20; $160 co pay per day days 21 until 49; no co pay days 50 until 100 Outpatient: $40 co pay per visit     Co-Pay: visits per medical neccesity Home Health: 100%      Co-Pay: visits per medical neccesity DME: 80%     Co-Pay: 20% Providers: in network  SECONDARY: none     Medicaid Application Date:       Case Manager:  Disability Application Date:       Case Worker:   Emergency Contact Information         Contact Information    Name Relation Home Work [place]  Spouse [phone]  [phone]   Arvine, Clayburn   [phone]   Jhostin, Epps   [phone]     Current Medical History  Patient Admitting Diagnosis:  Right MCA infarct  History of Present Illness: Jason [name] is a 72 year old male with history of HTN, rectal cancer, neuropathy, severe pulmonary HTN, CKD s/p AVF, OSA; who was admitted on 06/08/18 with left sided weakness  and severely dysarthric speech.CT/perfusion study showed occluded right ICA,right ICA terminus and MCA, occluded right vertebral artery and soft plaque with thrombus left vertebral artery at origin.He underwent cerebral angiogram with rescue stent and T1c went to be reperfusion. Elevated blood pressures treated with Celebrex and stroke felt to be due to large vessel atherosclerosis. 30-day cardiac event monitor recommended to rule out A. fib as cause. BLE Dopplers negative for DVT. MRI/MRA brain done revealing acute right MCA infarct greatest involvement in basal ganglia.Patient placed on aspirin and Brilinta due to intracranial stents.  2D echo showed EF 20 to 25% consistent with dilated cardiomyopathy. Nephrology following for input and hyperkalemia treated with [facility] without improvement. Due to difficulty cannulating AV fistula, patient underwent fistulogram with placement of stent in left cephalic arch stenosis. Patient now with ESRD andHDto manage hyperkalemia.Patient with resultant left sided weakness with dysarthria, delayed processing with poor awareness of deficits.   Complete NIHSS TOTAL: 1  Past Medical History      Past Medical History:  Diagnosis Date  . Anemia   . Arthritis   . Chronic renal insufficiency 07/31/2011  . COPD (chronic obstructive pulmonary disease) ([place])    Jason [name] denies  . Deficiency anemia 07/20/2012  . Diverticulosis   . Edema 09/21/2010   Qualifier: Diagnosis of  By: Jerold Coombe    . ERECTILE DYSFUNCTION, MILD 05/11/2007   Qualifier:  Diagnosis of  By: Sherren Mocha MD, Jory Ee   . Exertional dyspnea 10/30/2015  . GOUT 05/11/2007   Qualifier: Diagnosis of  By: Sherren Mocha MD, Jory Ee   . Hereditary and idiopathic peripheral neuropathy 09/14/2014  . History of radiation therapy 02/21/13-03/31/13   rectum 50.4Gy total dose  . Hypersomnia 10/30/2015  . Hypertension   . Lung nodule 10/30/2015  . OAB (overactive bladder) 01/09/2016  . Obesity (BMI 30.0-34.9) 10/30/2015  . Obstructive sleep apnea 07/29/2010   HST 11/2015 AHI 54.  On autocpap  >> 10 cm   . Peripheral edema 09/19/2013  . rectal ca dx'd 02/01/13   rectal. Radiation and chemotherapy- remains on chemotherapy-next tx. 08-22-13.   . S/P partial lobectomy of lung 11/20/2015  . S/P thoracotomy   . SUI (stress urinary incontinence), male 01/09/2016    Family History  family history includes Hypertension in his father, mother, and other; Stroke in his father and mother.  Prior Rehab/Hospitalizations:  Has the patient had major surgery during 100 days prior to admission? No  Current Medications   Current Facility-Administered Medications:  .   stroke: mapping our early stages of recovery book, , Does not apply, Once, Waynetta Sandy, MD .  0.9 %  sodium chloride infusion, 100 mL, Intravenous, PRN, Edrick Oh, MD .  0.9 %  sodium chloride infusion, 100 mL, Intravenous, PRN, Edrick Oh, MD .  acetaminophen (TYLENOL) tablet 650 mg, 650 mg, Oral, Q4H PRN **OR** acetaminophen (TYLENOL) solution 650 mg, 650 mg, Per Tube, Q4H PRN **OR** acetaminophen (TYLENOL) suppository 650 mg, 650 mg, Rectal, Q4H PRN, Waynetta Sandy, MD .  allopurinol (ZYLOPRIM) tablet 100 mg, 100 mg, Oral, Daily, Waynetta Sandy, MD, 100 mg at 06/16/18 1022 .  alteplase (CATHFLO ACTIVASE) injection 2 mg, 2 mg, Intracatheter, Once PRN, Edrick Oh, MD .  aspirin chewable tablet 81 mg, 81 mg, Oral, Daily, 81 mg at 06/16/18 1022 **OR** [DISCONTINUED] aspirin chewable tablet 81 mg, 81  mg, Per Tube, Daily, Deveshwar, Sanjeev, MD, 81 mg at 06/09/18 0927 .  atorvastatin (LIPITOR) tablet 20 mg, 20 mg, Oral, q1800,  Waynetta Sandy, MD, 20 mg at 06/15/18 1642 .  calcitRIOL (ROCALTROL) capsule 0.25 mcg, 0.25 mcg, Oral, Daily, Waynetta Sandy, MD, 0.25 mcg at 06/16/18 1022 .  calcium carbonate (OS-CAL - dosed in mg of elemental calcium) tablet 500 mg of elemental calcium, 1 tablet, Oral, Q breakfast, Waynetta Sandy, MD, 500 mg of elemental calcium at 06/16/18 1023 .  chlorhexidine (PERIDEX) 0.12 % solution 15 mL, 15 mL, Mouth Rinse, BID, Waynetta Sandy, MD, 15 mL at 06/16/18 1025 .  Chlorhexidine Gluconate Cloth 2 % PADS 6 each, 6 each, Topical, Q0600, Edrick Oh, MD, 6 each at 06/16/18 1133 .  cholecalciferol (VITAMIN D3) tablet 5,000 Units, 5,000 Units, Oral, Daily, Waynetta Sandy, MD, 5,000 Units at 06/16/18 1022 .  Darbepoetin Alfa (ARANESP) injection 100 mcg, 100 mcg, Intravenous, Samuel Germany, MD, 100 mcg at 06/15/18 2249 .  doxazosin (CARDURA) tablet 8 mg, 8 mg, Oral, Daily, Waynetta Sandy, MD, 8 mg at 06/16/18 1022 .  feeding supplement (ENSURE ENLIVE) (ENSURE ENLIVE) liquid 237 mL, 237 mL, Oral, BID BM, Waynetta Sandy, MD, 237 mL at 06/15/18 1641 .  gabapentin (NEURONTIN) capsule 300 mg, 300 mg, Oral, TID, Waynetta Sandy, MD, 300 mg at 06/16/18 1022 .  heparin injection 1,000 Units, 1,000 Units, Dialysis, PRN, Edrick Oh, MD .  heparin injection 5,000 Units, 5,000 Units, Subcutaneous, Q8H, Waynetta Sandy, MD, 5,000 Units at 06/16/18 0543 .  lidocaine (PF) (XYLOCAINE) 1 % injection 5 mL, 5 mL, Intradermal, PRN, Edrick Oh, MD .  lidocaine-prilocaine (EMLA) cream 1 application, 1 application, Topical, PRN, Edrick Oh, MD .  MEDLINE mouth rinse, 15 mL, Mouth Rinse, q12n4p, Waynetta Sandy, MD, 15 mL at 06/15/18 1642 .  multivitamin (RENA-VIT) tablet 1 tablet,  1 tablet, Oral, QHS, Waynetta Sandy, MD, 1 tablet at 06/16/18 0037 .  pantoprazole (PROTONIX) EC tablet 40 mg, 40 mg, Oral, Daily, Waynetta Sandy, MD, 40 mg at 06/16/18 1022 .  pentafluoroprop-tetrafluoroeth (GEBAUERS) aerosol 1 application, 1 application, Topical, PRN, Edrick Oh, MD .  senna-docusate (Senokot-S) tablet 1 tablet, 1 tablet, Oral, QHS PRN, Waynetta Sandy, MD .  ticagrelor [facility]) tablet 90 mg, 90 mg, Oral, BID, 90 mg at 06/16/18 1026 **OR** [DISCONTINUED] ticagrelor (BRILINTA) tablet 90 mg, 90 mg, Per Tube, BID, Deveshwar, Sanjeev, MD, 90 mg at 06/09/18 0601 .  traMADol (ULTRAM) tablet 50 mg, 50 mg, Oral, Q12H PRN, Waynetta Sandy, MD, 50 mg at 06/16/18 1201  Patients Current Diet:     Diet Order                  Diet renal/carb modified with fluid restriction Diet-HS Snack? Nothing; Fluid restriction: 1200 mL Fluid; Room service appropriate? Yes; Fluid consistency: Thin  Diet effective now               Precautions / Restrictions Precautions Precautions: Fall Precaution Comments: L inattention Restrictions Weight Bearing Restrictions: No   Has the patient had 2 or more falls or a fall with injury in the past year?No  Prior Activity Level Community (5-7x/wk): independent and active  Development worker, international aid / [place] Devices/Equipment: None Home Equipment: None  Prior Device Use: Indicate devices/aids used by the patient prior to current illness, exacerbation or injury? None of the above  Prior Functional Level Prior Function Level of Independence: Independent  Self Care: Did the patient need help bathing, dressing, using the toilet or eating?  Independent  Indoor Mobility:  Did the patient need assistance with walking from room to room (with or without device)? Independent  Stairs: Did the patient need assistance with internal or external stairs (with or without device)?  Independent  Functional Cognition: Did the patient need help planning regular tasks such as shopping or remembering to take medications? Independent  Current Functional Level Cognition  Arousal/Alertness: Awake/alert Overall Cognitive Status: Impaired/Different from baseline Current Attention Level: Selective Orientation Level: Oriented to person, Oriented to place, Oriented to situation, Disoriented to time Safety/Judgement: Decreased awareness of safety, Decreased awareness of deficits General Comments: Jason [name]'s wife was present this session Attention: Focused, Sustained, Selective Focused Attention: Appears intact Sustained Attention: Appears intact Selective Attention: Appears intact Memory: Impaired Memory Impairment: Decreased recall of new information Awareness: Impaired Awareness Impairment: Emergent impairment Problem Solving: Impaired Problem Solving Impairment: Verbal complex, Functional complex Executive Function: Reasoning Reasoning: Impaired Reasoning Impairment: Verbal basic Behaviors: Impulsive Safety/Judgment: Appears intact    Extremity Assessment (includes Sensation/Coordination)  Upper Extremity Assessment: Defer to OT evaluation LUE Deficits / Details: Moving LUE voluntarily with difficulty; AROM overall WFL; generalized weakness; poor in-hand manipulatiaons skills but attempting to use functionally; Brunstrom stage V (independencen from basic synergies) LUE Coordination: decreased fine motor, decreased gross motor  Lower Extremity Assessment: LLE deficits/detail LLE Deficits / Details: hip 3-/5, knee 4/5, ankle 3/5 LLE Sensation: decreased light touch, decreased proprioception LLE Coordination: decreased gross motor    ADLs  Overall ADL's : Needs assistance/impaired Eating/Feeding: Supervision/ safety, Set up, Sitting Eating/Feeding Details (indicate cue type and reason): Jason [name] eating breakfast upon arrival, agreeable to transition to recliner for  increased ease of performing task; setup for open of drink contatiners end of session  Grooming: Wash/dry hands, Wash/dry face, Set up, Sitting, Bed level Grooming Details (indicate cue type and reason): Jason [name] requesting towel to wash hands from breakfast; cues to wash face as Jason [name] also with food around mouth Upper Body Bathing: Minimal assistance, Sitting Lower Body Bathing: Moderate assistance, Sit to/from stand Upper Body Dressing : Moderate assistance, Sitting Lower Body Dressing: Maximal assistance, Sit to/from stand Toilet Transfer: +2 for physical assistance, Moderate assistance, Stand-pivot, BSC Toileting- Clothing Manipulation and Hygiene: Maximal assistance, Sit to/from stand Functional mobility during ADLs: Minimal assistance, Moderate assistance, Rolling walker(stand pivot transfer) General ADL Comments: Jason [name] agreeable to getting OOB to eat breakfast, assisted with transfer to recliner and setup in optimal position to continue eating    Mobility  Overal bed mobility: Needs Assistance Bed Mobility: Supine to Sit Rolling: Min guard Supine to sit: Min guard General bed mobility comments: Jason [name] required increased time with heavy reliance of railing to assist into sitting edge of bed.  To return to bed he required cues for boosting to [facility] for improved positioning.      Transfers  Overall transfer level: Needs assistance Equipment used: Rolling walker (2 wheeled) Transfers: Sit to/from Stand Sit to Stand: Mod assist Stand pivot transfers: Mod assist, Min assist General transfer comment: Jason [name] continues to require cues for safe hand placement and to avoid pulling into standing with use of device.  Jason [name] presents with posterior bias in standing and required cues to steady and maintain balance.      Ambulation / Gait / Stairs / Wheelchair Mobility  Ambulation/Gait Ambulation/Gait assistance: Min assist, Mod assist(Min assistance for forward ambulation remains to require mod assistance for turns  and for backing.  ) Gait Distance (Feet): 55 Feet Assistive device: Rolling walker (2 wheeled) Gait Pattern/deviations: Step-to pattern, Decreased step length - left, Decreased stride  length, Decreased weight shift to right, Decreased stance time - right, Ataxic, Leaning posteriorly, Staggering left, Trunk flexed General Gait Details: Jason [name] continues to require assistance to elevate posture and increased hip extension.  Cues for RW positioning during turns as he continues to have difficulty keeping his feet in RW when turning.  LOB when turning.      Posture / Balance Dynamic Sitting Balance Sitting balance - Comments: minguard for safety with sitting balance, noticed slight posterior lean Balance Overall balance assessment: Needs assistance Sitting-balance support: Bilateral upper extremity supported, Feet supported Sitting balance-Leahy Scale: Fair Sitting balance - Comments: minguard for safety with sitting balance, noticed slight posterior lean Postural control: Posterior lean Standing balance support: Bilateral upper extremity supported, During functional activity Standing balance-Leahy Scale: Poor Standing balance comment: reliant on external support and bil UE support    Special needs/care consideration BiPAP/CPAP pta CPM n/a Continuous Drip IV n/a Dialysis New ESRD to hemodialysis this admit. First treatment 12/10 Life Vest n/a Oxygen n/a Special Bed n/a Trach Size n/a Wound Vac n/a Skin left AV fistula Bowel mgmt: continent LBM 12/9 Bladder mgmt: external catheter Diabetic mgmt Hb A1c 5.5 30 days cardiac event monitoring recommended New to hemodialysis this admission; sees Dr. Graylon Gunning outpt nephrologist; Clipp process pending to outpt dialysis center    Previous Home Environment Living Arrangements: Spouse/significant other  Lives With: Spouse Available Help at Discharge: Family, Available 24 hours/day Type of Home: House Home Layout: One level Home Access: Stairs  to enter Entrance Stairs-Rails: None Entrance Stairs-Number of Steps: 1 Bathroom Shower/Tub: Optometrist: Yes How Accessible: Accessible via walker [place]: No  Discharge Living Setting Plans for Discharge Living Setting: Patient's home, Lives with (comment)(wife) Type of Home at Discharge: House Discharge Home Layout: One level Discharge Home Access: Stairs to enter Entrance Stairs-Rails: None Entrance Stairs-Number of Steps: 1 Discharge Bathroom Shower/Tub: Tub/shower unit Discharge Bathroom Toilet: Standard Discharge Bathroom Accessibility: Yes How Accessible: Accessible via walker Does the patient have any problems obtaining your medications?: No  Social/Family/Support Systems Patient Roles: Spouse Contact Information: wife, Jason [name] Anticipated Caregiver: wife Anticipated Ambulance person Information: see above Ability/Limitations of Caregiver: no limitations Caregiver Availability: 24/7 Discharge Plan Discussed with Primary Caregiver: Yes Is Caregiver In Agreement with Plan?: Yes Does Caregiver/Family have Issues with Lodging/Transportation while Jason [name] is in Rehab?: No   Goals/Additional Needs Patient/Family Goal for Rehab: supervision Jason [name], supervision to min assist OT. Mod I to sueprvision SLP Expected length of stay: ELOS 15 to 21 days Jason [name]/Family Agrees to Admission and willing to participate: Yes Program Orientation Provided & Reviewed with Jason [name]/Caregiver Including Roles  & Responsibilities: Yes  Decrease burden of Care through IP rehab admission: n/a  Possible need for SNF placement upon discharge: not anticipated  Patient Condition: This patient's medical and functional status has changed since the consult dated: 06/11/2018 in which the Rehabilitation Physician determined and documented that the patient's condition is appropriate for intensive rehabilitative care in an inpatient rehabilitation  facility. See "History of Present Illness" (above) for medical update. Functional changes are: min to mod assist. Patient's medical and functional status update has been discussed with the Rehabilitation physician and patient remains appropriate for inpatient rehabilitation. Will admit to inpatient rehab today.  Preadmission Screen Completed By:  Cleatrice Burke, 06/16/2018 1:02 PM ______________________________________________________________________   Discussed status with Dr. Letta Pate on 06/16/2018 at  1311 and received telephone approval for admission today.  Admission Coordinator:  Cleatrice Burke, time [id number]  Date 06/16/2018       Cosigned by: Jason Blake, MD at 06/16/2018 1:34 PM  Revision History

## 2018-06-16 NOTE — Progress Notes (Signed)
HD tx completed @ 2330 w/bp issues starting an hour in to tx UF goal still met Blood rinsed back VSS Report called to [place], Therapist, sports

## 2018-06-16 NOTE — Progress Notes (Addendum)
Inpatient Rehabilitation Admissions Coordinator  Discussed with Dr. Leonie Man and Burnetta Sabin, [facility]. Patient felt medically ready for d/c to CIR. First hemodialysis treatment last night and patient to have second dialysis treatment today. I confirmed second dialysis treatment today with Hemodialysis , Caryl Pina, and they are made aware that I will admit pt to inpt rehab today after dialysis. RN CM and SW made aware. I will make the arrangements to admit today. I notified pt's wife by phone and she is aware and in agreement.  Danne Baxter, RN, MSN Rehab Admissions Coordinator [phone] 06/16/2018 11:53 AM

## 2018-06-16 NOTE — Care Management Note (Signed)
Case Management Note  Patient Details  Name: Jason SOKOLOV Sr. MRN: [id number] Date of Birth: May 26, 1946  Subjective/Objective:                    Action/Plan: Pt discharging to CIR today. CM signing off.   Expected Discharge Date:                  Expected Discharge Plan:  IP Rehab Facility  In-House Referral:  Clinical Social Work  Discharge planning Services  CM Consult  Post Acute Care Choice:    Choice offered to:     DME Arranged:    DME Agency:     HH Arranged:    [place] Agency:     Status of Service:  Completed, signed off  If discussed at [employer], dates discussed:    Additional Comments:  Pollie Friar, RN 06/16/2018, 11:51 AM

## 2018-06-16 NOTE — Progress Notes (Signed)
Patient discharged from 3W08 to 4M05 in bed. Monitor removed prior to discharge. Patient accompanied by spouse. Report concluded with RN.

## 2018-06-16 NOTE — Progress Notes (Signed)
Patient refused CPAP for the night  

## 2018-06-16 NOTE — Plan of Care (Signed)
Plan of care reviewed. 

## 2018-06-17 ENCOUNTER — Telehealth: Payer: Self-pay | Admitting: *Deleted

## 2018-06-17 ENCOUNTER — Inpatient Hospital Stay (HOSPITAL_COMMUNITY): Payer: Medicare Other | Admitting: Speech Pathology

## 2018-06-17 ENCOUNTER — Inpatient Hospital Stay (HOSPITAL_COMMUNITY): Payer: Medicare Other | Admitting: Physical Therapy

## 2018-06-17 ENCOUNTER — Inpatient Hospital Stay (HOSPITAL_COMMUNITY): Payer: Medicare Other | Admitting: Occupational Therapy

## 2018-06-17 DIAGNOSIS — I63411 Cerebral infarction due to embolism of right middle cerebral artery: Secondary | ICD-10-CM

## 2018-06-17 DIAGNOSIS — G8114 Spastic hemiplegia affecting left nondominant side: Secondary | ICD-10-CM

## 2018-06-17 DIAGNOSIS — I639 Cerebral infarction, unspecified: Secondary | ICD-10-CM

## 2018-06-17 LAB — HEPATITIS B SURFACE ANTIBODY,QUALITATIVE: Hep B S Ab: NONREACTIVE

## 2018-06-17 LAB — HEPATITIS B CORE ANTIBODY, TOTAL: Hep B Core Total Ab: NEGATIVE

## 2018-06-17 MED ORDER — ENOXAPARIN SODIUM 30 MG/0.3ML ~~LOC~~ SOLN
30.0000 mg | SUBCUTANEOUS | Status: DC
  Administered 2018-06-17 – 2018-06-28 (×12): 30 mg via SUBCUTANEOUS
  Filled 2018-06-17 (×13): qty 0.3

## 2018-06-17 NOTE — Progress Notes (Signed)
[place] PHYSICAL MEDICINE & REHABILITATION PROGRESS NOTE   Subjective/Complaints:  No issues overnite  ROS- denies CP, SOB, N/V/D     Objective:   No results found. Recent Labs    06/16/18 1121 06/16/18 1845  WBC 13.8* 14.5*  HGB 7.3* 7.7*  HCT 23.4* 24.9*  PLT 184 215   Recent Labs    06/16/18 1121 06/16/18 1800  NA 133* 136  K 4.6 3.7  CL 95* 95*  CO2 23 27  GLUCOSE 100* 117*  BUN 79* 43*  CREATININE 7.48* 4.96*  CALCIUM 9.6 9.5   No intake or output data in the 24 hours ending 06/17/18 0739   Physical Exam: Vital Signs Blood pressure 135/66, pulse 96, temperature 98.8 F (37.1 C), temperature source Oral, resp. rate 18, height 5\' 8"  (1.727 m), weight 63.9 kg, SpO2 100 %.     Assessment/Plan: 1. Functional deficits secondary to RIght MCA infarct which require 3+ hours per day of interdisciplinary therapy in a comprehensive inpatient rehab setting.  Physiatrist is providing close team supervision and 24 hour management of active medical problems listed below.  Physiatrist and rehab team continue to assess barriers to discharge/monitor patient progress toward functional and medical goals  Care Tool:  Bathing              Bathing assist       Upper Body Dressing/Undressing Upper body dressing        Upper body assist      Lower Body Dressing/Undressing Lower body dressing            Lower body assist       Toileting Toileting    Toileting assist Assist for toileting: Moderate Assistance - Patient 50 - 74%     Transfers Chair/bed transfer  Transfers assist     Chair/bed transfer assist level: Maximal Assistance - Patient 25 - 49%     Locomotion Ambulation   Ambulation assist              Walk 10 feet activity   Assist           Walk 50 feet activity   Assist           Walk 150 feet activity   Assist           Walk 10 feet on uneven surface  activity   Assist            Wheelchair     Assist               Wheelchair 50 feet with 2 turns activity    Assist            Wheelchair 150 feet activity     Assist          Medical Problem List and Plan: 1. Decrease mobility and ADLs secondary to Right MCA infarct PT, OT, evals today 2. DVT Prophylaxis/Anticoagulation: Pharmaceutical:Lovenox reduce to 30mg  per day 3. Pain Management:Tylenol as needed 4. Mood:LCSW to follow for evaluation and support 5. Neuropsych: This patient is capable of making decisions on his own behalf. 6. Skin/Wound Care:Routine pressure relief measures 7. Fluids/Electrolytes/Nutrition:Monitor I's and O's. Nephrology check lites in the 8. ESRD stage V: hyperkalemia to be treated with HD M W F .Per Nephro 9. Anemia of chronic disease: Started on darbepoetinfor supplementation 10. HTN: Monitor bid. Continue Cardura.  11. Neuropathy: Decrease gabapentin to renal dose--once daily. 12. OSA: Continue to use CPAP when napping or when asleep.  LOS: 1 days A FACE TO FACE EVALUATION WAS PERFORMED  Jason [name] 06/17/2018, 7:39 AM

## 2018-06-17 NOTE — Progress Notes (Signed)
Inpatient Rehabilitation  Patient information reviewed and entered into eRehab system by [facility]. Loni Beckwith., CCC/SLP, PPS Coordinator.  Information including medical coding, functional ability and quality indicators will be reviewed and updated through discharge.    Per nursing patient was given "Data Collection Information Summary" for Patients in Inpatient Rehabilitation Facilities with attached "Privacy Act [place] Records" upon admission, present in the education notebook.

## 2018-06-17 NOTE — Care Management Note (Signed)
[facility] Individual Statement of Services  Patient Name:  Jason [name].  Date:  06/17/2018  Welcome to the [facility].  Our goal is to provide you with an individualized program based on your diagnosis and situation, designed to meet your specific needs.  With this comprehensive rehabilitation program, you will be expected to participate in at least 3 hours of rehabilitation therapies Monday-Friday, with modified therapy programming on the weekends.  Your rehabilitation program will include the following services:  Physical Therapy (PT), Occupational Therapy (OT), Speech Therapy (ST), 24 hour per day rehabilitation nursing, Case Management (Social Worker), Rehabilitation Medicine, Nutrition Services and Pharmacy Services  Weekly team conferences will be held on Wednesday to discuss your progress.  Your Social Worker will talk with you frequently to get your input and to update you on team discussions.  Team conferences with you and your family in attendance may also be held.  Expected length of stay: 10-12 days  Overall anticipated outcome: supervision with cueing  Depending on your progress and recovery, your program may change. Your Social Worker will coordinate services and will keep you informed of any changes. Your Social Worker's name and contact numbers are listed  below.  The following services may also be recommended but are not provided by the [place] [facility] will be made to provide these services after discharge if needed.  Arrangements include referral to agencies that provide these services.  Your insurance has been verified to be:  [employer]-Medicare Your primary doctor is:  [name]  Pertinent information will be shared with your doctor and your insurance company.  Social Worker:  Ovidio Kin, [place] or [phone]  Information discussed with and copy given to patient by: Elease Hashimoto, 06/17/2018, 10:54 AM

## 2018-06-17 NOTE — Evaluation (Signed)
Occupational Therapy Assessment and Plan  Patient Details  Name: Jason DUBIE Sr. MRN: [id number] Date of Birth: 06-13-1946  OT Diagnosis: ataxia, hemiplegia affecting non-dominant side and muscle weakness (generalized) Rehab Potential: Rehab Potential (ACUTE ONLY): Excellent ELOS: 10-12 days   Today's Date: 06/17/2018 OT Individual Time: 0930-1030 OT Individual Time Calculation (min): 60 min     Problem List:  Patient Active Problem List   Diagnosis Date Noted  . ESRD on dialysis ([place]) 06/16/2018  . Mild malnutrition ([place]) 06/16/2018  . Stroke due to embolism ([place]) 06/16/2018  . Acute respiratory insufficiency   . Chronic systolic (congestive) heart failure ([place])   . Acute ischemic stroke ([place]) 06/08/2018  . Middle cerebral artery embolism, right s/p Mechanical Thrombectomy 06/08/2018  . CHF (congestive heart failure) ([place]) 05/08/2018  . Normocytic anemia 05/08/2018  . Hyperlipidemia LDL goal <70 09/23/2017  . OAB (overactive bladder) 01/09/2016  . SUI (stress urinary incontinence), male 01/09/2016  . Hypersomnia 10/30/2015  . Obesity (BMI 30.0-34.9) 10/30/2015  . Routine general medical examination at a health care facility 06/04/2015  . Medicare annual wellness visit, subsequent 06/04/2015  . Hereditary and idiopathic peripheral neuropathy 09/14/2014  . Rectal cancer ([place]) 02/01/2013  . Chronic renal insufficiency 07/31/2011  . Obstructive sleep apnea 07/29/2010  . GOUT 05/11/2007  . ERECTILE DYSFUNCTION, MILD 05/11/2007  . Essential hypertension 05/11/2007    Past Medical History:  Past Medical History:  Diagnosis Date  . Anemia   . Arthritis   . Chronic renal insufficiency 07/31/2011  . COPD (chronic obstructive pulmonary disease) ([place])    pt denies  . Deficiency anemia 07/20/2012  . Diverticulosis   . Edema 09/21/2010   Qualifier: Diagnosis of  By: Jerold Coombe    . ERECTILE DYSFUNCTION, MILD 05/11/2007   Qualifier: Diagnosis of  By: Sherren Mocha MD, Jory Ee   .  Exertional dyspnea 10/30/2015  . GOUT 05/11/2007   Qualifier: Diagnosis of  By: Sherren Mocha MD, Jory Ee   . Hereditary and idiopathic peripheral neuropathy 09/14/2014  . History of radiation therapy 02/21/13-03/31/13   rectum 50.4Gy total dose  . Hypersomnia 10/30/2015  . Hypertension   . Lung nodule 10/30/2015  . OAB (overactive bladder) 01/09/2016  . Obesity (BMI 30.0-34.9) 10/30/2015  . Obstructive sleep apnea 07/29/2010   HST 11/2015 AHI 54.  On autocpap  >> 10 cm   . Peripheral edema 09/19/2013  . rectal ca dx'd 02/01/13   rectal. Radiation and chemotherapy- remains on chemotherapy-next tx. 08-22-13.   . S/P partial lobectomy of lung 11/20/2015  . S/P thoracotomy   . SUI (stress urinary incontinence), male 01/09/2016   Past Surgical History:  Past Surgical History:  Procedure Laterality Date  . A/V FISTULAGRAM Left 06/14/2018   Procedure: A/V FISTULAGRAM;  Surgeon: Waynetta Sandy, MD;  Location: [facility];  Service: Cardiovascular;  Laterality: Left;  . AV FISTULA PLACEMENT Left 03/24/2016   Procedure: CREATION OF LEFT BRACIOCEPHALIC ARTERIOVENOUS (AV) FISTULA;  Surgeon: Elam Dutch, MD;  Location: [facility];  Service: Vascular;  Laterality: Left;  . COLON SURGERY  05/20/2013  . COLONOSCOPY  2010   [facility]  . EUS N/A 02/03/2013   Procedure: LOWER ENDOSCOPIC ULTRASOUND (EUS);  Surgeon: Milus Banister, MD;  Location: [facility];  Service: Endoscopy;  Laterality: N/A;  . ILEOSTOMY CLOSURE N/A 08/24/2013   Procedure: CLOSURE OF LOOP ILEOSTOMY ;  Surgeon: Leighton Ruff, MD;  Location: [facility];  Service: General;  Laterality: N/A;  . IR ANGIO VERTEBRAL  SEL SUBCLAVIAN INNOMINATE BILAT MOD SED  06/08/2018  . IR CT HEAD LTD  06/08/2018  . IR INTRAVSC STENT CERV CAROTID W/O EMB-PROT MOD SED INC ANGIO  06/08/2018  . IR PERCUTANEOUS ART THROMBECTOMY/INFUSION INTRACRANIAL INC DIAG ANGIO  06/08/2018  . LAPAROSCOPIC LOW ANTERIOR RESECTION N/A 05/20/2013   Procedure: LAPAROSCOPIC LOW ANTERIOR  RESECTION WITH SPLENIC FLEXURE MOBILIZATION;  Surgeon: Leighton Ruff, MD;  Location: [facility];  Service: General;  Laterality: N/A;  . OSTOMY N/A 05/20/2013   Procedure: diverting OSTOMY;  Surgeon: Leighton Ruff, MD;  Location: [facility];  Service: General;  Laterality: N/A;  . PERIPHERAL VASCULAR INTERVENTION Left 06/14/2018   Procedure: PERIPHERAL VASCULAR INTERVENTION;  Surgeon: Waynetta Sandy, MD;  Location: [facility];  Service: Cardiovascular;  Laterality: Left;  AVF on the Left  . RADIOLOGY WITH ANESTHESIA N/A 06/08/2018   Procedure: IR WITH ANESTHESIA;  Surgeon: Radiologist, Medication, MD;  Location: [facility];  Service: Radiology;  Laterality: N/A;  . VIDEO ASSISTED THORACOSCOPY (VATS)/WEDGE RESECTION Left 11/20/2015   Procedure: VIDEO ASSISTED THORACOSCOPY (VATS)/LUNG RESECTION;  Surgeon: [name], MD;  Location: [facility];  Service: Thoracic;  Laterality: Left;  Marland Kitchen VIDEO BRONCHOSCOPY N/A 11/20/2015   Procedure: VIDEO BRONCHOSCOPY;  Surgeon: [name], MD;  [facility];  Service: Thoracic;  Laterality: N/A;    Assessment & Plan Clinical Impression: Jason [name] is a 72 year old male with history of HTN, rectal cancer, neuropathy, severe pulmonary HTN, CKD s/p AVF, OSA; who was admitted on 06/08/18 with left sided weakness and severely dysarthric speech.CT/perfusion study showed occluded right ICA,right ICA terminus and MCA, occluded right vertebral artery and soft plaque with thrombus left vertebral artery at origin.He underwent cerebral angiogram with rescue stent and T1c went to be reperfusion. Elevated blood pressures treated with Celebrex and stroke felt to be due to large vessel atherosclerosis. 30-day cardiac event monitor recommended to rule out A. fib as cause. BLE Dopplers negative for DVT. MRI/MRA brain done revealing acute right MCA infarct greatest involvement in basal ganglia.Patient placed on aspirin and Brilinta due to intracranial  stents.  2D echo showed EF 20 to 25% consistent with dilated cardiomyopathy. Nephrology following for input and hyperkalemia treated with [facility] without improvement. Due to difficulty cannulating AV fistula, patient underwent fistulogram with placement of stent in left cephalic arch stenosis. Patient now with ESRD andHDto manage hyperkalemia.Patient with resultant left sided weakness with dysarthria, delayed processing with poor awareness of deficits. Hecontinues to have limitations in ADLs and mobility and CIR recommended follow-up therapy Patient transferred to [facility] on 06/16/2018 .    Patient currently requires min with basic self-care skills secondary to muscle weakness, decreased cardiorespiratoy endurance, ataxia and decreased coordination, decreased attention and decreased safety awareness and decreased standing balance, decreased postural control, hemiplegia and decreased balance strategies.  Prior to hospitalization, patient could complete ADLs/IADLs with independent .  Patient will benefit from skilled intervention to decrease level of assist with basic self-care skills and increase independence with basic self-care skills prior to discharge home with care partner.  Anticipate patient will require 24 hour supervision and follow up home health and follow up outpatient.  OT - End of Session Activity Tolerance: Tolerates 10 - 20 min activity with multiple rests Endurance Deficit: Yes OT Assessment Rehab Potential (ACUTE ONLY): Excellent OT Patient demonstrates impairments in the following area(s): Balance;Cognition;Safety;Sensory;Endurance;Motor OT Basic ADL's Functional Problem(s): Eating;Grooming;Bathing;Dressing;Toileting OT Transfers Functional Problem(s): Toilet;Tub/Shower OT Additional Impairment(s): Fuctional Use of Upper Extremity OT Plan OT  Intensity: Minimum of 1-2 x/day, 45 to 90 minutes OT Frequency: 5 out of 7 days OT Duration/Estimated Length of Stay: 10-12 days OT  Treatment/Interventions: Balance/vestibular training;Discharge planning;Pain management;Functional electrical stimulation;Self Care/advanced ADL retraining;Therapeutic Activities;UE/LE Coordination activities;Therapeutic Exercise;Patient/family education;Functional mobility training;Cognitive remediation/compensation;Disease mangement/prevention;UE/LE Strength taining/ROM;Splinting/orthotics;Neuromuscular re-education;Psychosocial support;Community reintegration;DME/adaptive equipment instruction OT Self Feeding Anticipated Outcome(s): Mod I OT Basic Self-Care Anticipated Outcome(s): Supervision OT Toileting Anticipated Outcome(s): Supervision OT Bathroom Transfers Anticipated Outcome(s): Supervision OT Recommendation Recommendations for Other Services: Therapeutic Recreation consult Therapeutic Recreation Interventions: Stress management;Outing/community reintergration Patient destination: Home Follow Up Recommendations: Outpatient OT Equipment Recommended: To be determined   Skilled Therapeutic Intervention Pt seen for OT eval and ADL bathing/dressing session. Pt sitting up in w/c upon arrival, denied pain, however, requesting toileting task. He ambulated throughout room with RW and steadying assist. Completed toileting task with overall min A for balance, assist for thoroughness of buttock hygiene following BM. He completed toileting task x2 during session. VCs for safety as pt attempting to stand without assist from toilet.  Bathing/dressing completed from w/c level at sink. With cuing pt able to use L UE at stabilizer level to assist with set-up of items. Pt able to cross LEs into figure four position to thread LEs into pants, standing with steadying assist to pull pants up, overall mod A for LB dressing as assist provided for donning non-slid socks. Pt left seated in w/c at end of session, wife present, all needs in reach and chair belt alarm on. Throughout session, pt and wife educated  regarding role of OT, POC, CVA recovery, neuro re-ed, continuum of care, and d/c planning.   OT Evaluation Precautions/Restrictions  Precautions Precautions: Fall Restrictions Weight Bearing Restrictions: No General Chart Reviewed: Yes Pain Pain Assessment Pain Scale: 0-10 Pain Score: 0-No pain Home Living/Prior Functioning Home Living Family/patient expects to be discharged to:: Private residence Living Arrangements: Spouse/significant other Available Help at Discharge: Family, Available 24 hours/day Type of Home: House Home Access: Stairs to enter [employer] of Steps: per chart review one step to enter; pt reports no steps or thresholds. Will clarify with wife Home Layout: One level Bathroom Shower/Tub: Tub/shower unit, Industrial/product designer: Yes  Lives With: Spouse IADL History Homemaking Responsibilities: No Current License: No Prior Function Level of Independence: Independent with basic ADLs, Independent with gait, Independent with homemaking with ambulation, Independent with transfers  Able to Take Stairs?: No Driving: No Vocation: Retired Surveyor, mining Baseline Vision/History: Wears glasses Wears Glasses: Reading only Patient Visual Report: No change from baseline Vision Assessment?: No apparent visual deficits Perception  Perception: Impaired Comments: mild L inattention Praxis Praxis: Intact Cognition Overall Cognitive Status: Impaired/Different from baseline Arousal/Alertness: Awake/alert Orientation Level: Person;Place;Situation Person: Oriented Place: Oriented Situation: Oriented Year: Other (Comment)(1919) Month: December Day of Week: Incorrect Memory: Impaired Memory Impairment: Decreased recall of new information Immediate Memory Recall: Sock;Blue;Bed Memory Recall: Sock;Blue Memory Recall Sock: Without Cue Memory Recall Blue: Without Cue Attention: Sustained Sustained Attention: Appears  intact Awareness: Impaired Awareness Impairment: Anticipatory impairment Problem Solving: Appears intact Safety/Judgment: Impaired Comments: Slightly impulsive Sensation Sensation Light Touch: Appears Intact Proprioception: Appears Intact Coordination Gross Motor Movements are Fluid and Coordinated: No Fine Motor Movements are Fluid and Coordinated: No Coordination and Movement Description: L hemiplegia UE>LE Motor  Motor Motor: Ataxia;Hemiplegia Trunk/Postural Assessment  Cervical Assessment Cervical Assessment: Within Functional Limits Thoracic Assessment Thoracic Assessment: Within Functional Limits Lumbar Assessment Lumbar Assessment: Within Functional Limits Postural Control Postural Control: Deficits on evaluation(Posterior Bias)  Balance Balance Balance Assessed:  Yes Static Sitting Balance Static Sitting - Balance Support: No upper extremity supported;Feet supported Static Sitting - Level of Assistance: 5: Stand by assistance;6: Modified independent (Device/Increase time) Static Sitting - Comment/# of Minutes: Sitting on toilet Dynamic Sitting Balance Dynamic Sitting - Balance Support: No upper extremity supported;Feet supported;During functional activity Dynamic Sitting - Level of Assistance: 4: Min assist Sitting balance - Comments: Sitting to complete bathing/dressing tasks Static Standing Balance Static Standing - Balance Support: During functional activity;Bilateral upper extremity supported Static Standing - Level of Assistance: 4: Min assist Static Standing - Comment/# of Minutes: Posterior bias, standing to complete LB bathing/dressing Dynamic Standing Balance Dynamic Standing - Balance Support: During functional activity;Right upper extremity supported;Left upper extremity supported Dynamic Standing - Level of Assistance: 4: Min assist;3: Mod assist Dynamic Standing - Comments: Posterior bias, standing to complete LB bathing/dressing Extremity/Trunk  Assessment RUE Assessment RUE Assessment: Within Functional Limits LUE Assessment LUE Assessment: Exceptions to [facility] General Strength Comments: 4/5 throughout, weak gross grasp, Ataxic LUE Body System: Neuro LUE Strength LUE Overall Strength: Deficits      Refer to Care Plan for Long Term Goals  Recommendations for other services: Therapeutic Recreation  Stress management   Discharge Criteria: Patient will be discharged from OT if patient refuses treatment 3 consecutive times without medical reason, if treatment goals not met, if there is a change in medical status, if patient makes no progress towards goals or if patient is discharged from hospital.  The above assessment, treatment plan, treatment alternatives and goals were discussed and mutually agreed upon: by patient  Delaina Fetsch L 06/17/2018, 2:28 PM

## 2018-06-17 NOTE — Progress Notes (Signed)
Occupational Therapy Note  Patient Details  Name: Jason WITZKE Sr. MRN: [id number] Date of Birth: [birth year]  Today's Date: 06/17/2018 OT Missed Time: 34 Minutes Missed Time Reason: Unavailable (comment)(Pt off unit at dialysis)  Pt off unit at time of scheduled therapy session. Will attempt to make up time as able. Cont POC as pt able.    Cloria Ciresi L 06/17/2018, 4:49 PM

## 2018-06-17 NOTE — Evaluation (Signed)
Physical Therapy Assessment and Plan  Patient Details  Name: Jason FISH Sr. MRN: [id number] Date of Birth: [birth year]  PT Diagnosis: Abnormality of gait, Ataxia, Ataxic gait, Coordination disorder, Difficulty walking, Hemiparesis non-dominant and Muscle weakness Rehab Potential: Excellent ELOS: 10-12 days   Today's Date: 06/17/2018 PT Individual Time: 4098-1191 PT Individual Time Calculation (min): 75 min    Problem List:  Patient Active Problem List   Diagnosis Date Noted  . ESRD on dialysis ([place]) 06/16/2018  . Mild malnutrition ([place]) 06/16/2018  . Stroke due to embolism ([place]) 06/16/2018  . Acute respiratory insufficiency   . Chronic systolic (congestive) heart failure ([place])   . Acute ischemic stroke ([place]) 06/08/2018  . Middle cerebral artery embolism, right s/p Mechanical Thrombectomy 06/08/2018  . CHF (congestive heart failure) ([place]) 05/08/2018  . Normocytic anemia 05/08/2018  . Hyperlipidemia LDL goal <70 09/23/2017  . OAB (overactive bladder) 01/09/2016  . SUI (stress urinary incontinence), male 01/09/2016  . Hypersomnia 10/30/2015  . Obesity (BMI 30.0-34.9) 10/30/2015  . Routine general medical examination at a health care facility 06/04/2015  . Medicare annual wellness visit, subsequent 06/04/2015  . Hereditary and idiopathic peripheral neuropathy 09/14/2014  . Rectal cancer ([place]) 02/01/2013  . Chronic renal insufficiency 07/31/2011  . Obstructive sleep apnea 07/29/2010  . GOUT 05/11/2007  . ERECTILE DYSFUNCTION, MILD 05/11/2007  . Essential hypertension 05/11/2007    Past Medical History:  Past Medical History:  Diagnosis Date  . Anemia   . Arthritis   . Chronic renal insufficiency 07/31/2011  . COPD (chronic obstructive pulmonary disease) ([place])    pt denies  . Deficiency anemia 07/20/2012  . Diverticulosis   . Edema 09/21/2010   Qualifier: Diagnosis of  By: Jerold Coombe    . ERECTILE DYSFUNCTION, MILD 05/11/2007   Qualifier: Diagnosis of  By: Sherren Mocha MD,  Jory Ee   . Exertional dyspnea 10/30/2015  . GOUT 05/11/2007   Qualifier: Diagnosis of  By: Sherren Mocha MD, Jory Ee   . Hereditary and idiopathic peripheral neuropathy 09/14/2014  . History of radiation therapy 02/21/13-03/31/13   rectum 50.4Gy total dose  . Hypersomnia 10/30/2015  . Hypertension   . Lung nodule 10/30/2015  . OAB (overactive bladder) 01/09/2016  . Obesity (BMI 30.0-34.9) 10/30/2015  . Obstructive sleep apnea 07/29/2010   HST 11/2015 AHI 54.  On autocpap  >> 10 cm   . Peripheral edema 09/19/2013  . rectal ca dx'd 02/01/13   rectal. Radiation and chemotherapy- remains on chemotherapy-next tx. 08-22-13.   . S/P partial lobectomy of lung 11/20/2015  . S/P thoracotomy   . SUI (stress urinary incontinence), male 01/09/2016   Past Surgical History:  Past Surgical History:  Procedure Laterality Date  . A/V FISTULAGRAM Left 06/14/2018   Procedure: A/V FISTULAGRAM;  Surgeon: Waynetta Sandy, MD;  Location: [facility];  Service: Cardiovascular;  Laterality: Left;  . AV FISTULA PLACEMENT Left 03/24/2016   Procedure: CREATION OF LEFT BRACIOCEPHALIC ARTERIOVENOUS (AV) FISTULA;  Surgeon: Elam Dutch, MD;  Location: [facility];  Service: Vascular;  Laterality: Left;  . COLON SURGERY  05/20/2013  . COLONOSCOPY  2010   [facility]  . EUS N/A 02/03/2013   Procedure: LOWER ENDOSCOPIC ULTRASOUND (EUS);  Surgeon: Milus Banister, MD;  Location[facility];  Service: Endoscopy;  Laterality: N/A;  . ILEOSTOMY CLOSURE N/A 08/24/2013   Procedure: CLOSURE OF LOOP ILEOSTOMY ;  Surgeon: Leighton Ruff, MD;  [facility];  Service: General;  Laterality: N/A;  . IR ANGIO  VERTEBRAL SEL SUBCLAVIAN INNOMINATE BILAT MOD SED  06/08/2018  . IR CT HEAD LTD  06/08/2018  . IR INTRAVSC STENT CERV CAROTID W/O EMB-PROT MOD SED INC ANGIO  06/08/2018  . IR PERCUTANEOUS ART THROMBECTOMY/INFUSION INTRACRANIAL INC DIAG ANGIO  06/08/2018  . LAPAROSCOPIC LOW ANTERIOR RESECTION N/A 05/20/2013   Procedure:  LAPAROSCOPIC LOW ANTERIOR RESECTION WITH SPLENIC FLEXURE MOBILIZATION;  Surgeon: Leighton Ruff, MD;  [facility];  Service: General;  Laterality: N/A;  . OSTOMY N/A 05/20/2013   Procedure: diverting OSTOMY;  Surgeon: Leighton Ruff, MD;  [facility];  Service: General;  Laterality: N/A;  . PERIPHERAL VASCULAR INTERVENTION Left 06/14/2018   Procedure: PERIPHERAL VASCULAR INTERVENTION;  Surgeon: Waynetta Sandy, MD;  Location: [facility];  Service: Cardiovascular;  Laterality: Left;  AVF on the Left  . RADIOLOGY WITH ANESTHESIA N/A 06/08/2018   Procedure: IR WITH ANESTHESIA;  Surgeon: Radiologist, Medication, MD;  Location: [facility];  Service: Radiology;  Laterality: N/A;  . VIDEO ASSISTED THORACOSCOPY (VATS)/WEDGE RESECTION Left 11/20/2015   Procedure: VIDEO ASSISTED THORACOSCOPY (VATS)/LUNG RESECTION;  Surgeon: [name], MD;  Location: [facility];  Service: Thoracic;  Laterality: Left;  Marland Kitchen VIDEO BRONCHOSCOPY N/A 11/20/2015   Procedure: VIDEO BRONCHOSCOPY;  Surgeon: [name], MD;  Location[facility];  Service: Thoracic;  Laterality: N/A;    Assessment & Plan Clinical Impression: Jason [name] is a 72 year old male with history of HTN, rectal cancer, neuropathy, severe pulmonary HTN, CKD s/p AVF, OSA; who was admitted on 06/08/18 with left sided weakness and severely dysarthric speech.CT/perfusion study showed occluded right ICA,right ICA terminus and MCA, occluded right vertebral artery and soft plaque with thrombus left vertebral artery at origin.He underwent cerebral angiogram with rescue stent and T1c went to be reperfusion. Elevated blood pressures treated with Celebrex and stroke felt to be due to large vessel atherosclerosis. 30-day cardiac event monitor recommended to rule out A. fib as cause. BLE Dopplers negative for DVT. MRI/MRA brain done revealing acute right MCA infarct greatest involvement in basal ganglia.Patient placed on aspirin and Brilinta due  to intracranial stents.  2D echo showed EF 20 to 25% consistent with dilated cardiomyopathy. Nephrology following for input and hyperkalemia treated with [facility] without improvement. Due to difficulty cannulating AV fistula, patient underwent fistulogram with placement of stent in left cephalic arch stenosis. Patient now with ESRD andHDto manage hyperkalemia.Patient with resultant left sided weakness with dysarthria, delayed processing with poor awareness of deficits. Hecontinues to have limitations in ADLs and mobility and CIR recommended follow-up therapy.  Patient transferred to CIR on 06/16/2018 .   Patient currently requires min with mobility secondary to muscle weakness, decreased cardiorespiratoy endurance, impaired timing and sequencing, ataxia and decreased coordination and decreased standing balance, decreased postural control and decreased balance strategies.  Prior to hospitalization, patient was independent  with mobility and lived with Spouse in a House home.  Home access is per chart review one step to enter; pt reports no steps or thresholds. Will clarify with wife .  Patient will benefit from skilled PT intervention to maximize safe functional mobility, minimize fall risk and decrease caregiver burden for planned discharge home with 24 hour supervision.  Anticipate patient will benefit from follow up [place] at discharge.  PT - End of Session Activity Tolerance: Tolerates 10 - 20 min activity with multiple rests Endurance Deficit: Yes Endurance Deficit Description: fatigues quickly with mobility activities PT Assessment Rehab Potential (ACUTE/IP ONLY): Excellent PT Patient demonstrates impairments in the  following area(s): Balance;Endurance;Motor;Safety;Perception PT Transfers Functional Problem(s): Bed Mobility;Bed to Chair;Car;Furniture PT Locomotion Functional Problem(s): Ambulation;Wheelchair Mobility;Stairs PT Plan PT Intensity: Minimum of 1-2 x/day ,45 to 90 minutes PT  Frequency: 5 out of 7 days PT Duration Estimated Length of Stay: 10-12 days PT Treatment/Interventions: Ambulation/gait training;Discharge planning;Functional mobility training;Psychosocial support;Therapeutic Activities;Visual/perceptual remediation/compensation;Wheelchair propulsion/positioning;Therapeutic Exercise;Skin care/wound management;Neuromuscular re-education;Disease management/prevention;Balance/vestibular training;Cognitive remediation/compensation;DME/adaptive equipment instruction;Pain management;UE/LE Strength taining/ROM;UE/LE Coordination activities;Stair training;Patient/family education;Community reintegration PT Transfers Anticipated Outcome(s): modI PT Locomotion Anticipated Outcome(s): S overall LRAD PT Recommendation Recommendations for Other Services: Therapeutic Recreation consult Therapeutic Recreation Interventions: Outing/community reintergration Follow Up Recommendations: Home health PT;24 hour supervision/assistance Patient destination: Home Equipment Recommended: Rolling walker with 5" wheels  Skilled Therapeutic Intervention Pt received asleep in bed, requires increased time to arouse and participate, however ultimately participatory and agreeable to treatment. Assessed mobility as below with minA overall using RW. Voided twice during session; requires assist for cleanliness and changing clothing after incontinent of urine in standing. Educated pt on rehab process, goals, estimated LOS to be determined once all evaluations completed. Remained seated in w/c at end of session, chair alarm intact and all needs in reach.   PT Evaluation Precautions/Restrictions Precautions Precautions: Fall Restrictions Weight Bearing Restrictions: No General Chart Reviewed: Yes Response to Previous Treatment: Not applicable Family/Caregiver Present: No  Pain Pain Assessment Pain Scale: 0-10 Pain Score: 0-No pain Home Living/Prior Functioning Home Living Available Help at  Discharge: Family;Available 24 hours/day Type of Home: House Entrance Stairs-Number of Steps: per chart review one step to enter; pt reports no steps or thresholds. Will clarify with wife Home Layout: One level  Lives With: Spouse Prior Function Level of Independence: Independent with basic ADLs;Independent with gait;Independent with homemaking with ambulation;Independent with transfers  Able to Take Stairs?: No Driving: No Vocation: Retired Art gallery manager: Impaired Comments: mild L inattention Praxis Praxis: Intact  Cognition Overall Cognitive Status: Impaired/Different from baseline Arousal/Alertness: Awake/alert Orientation Level: Oriented X4 Attention: Sustained Sustained Attention: Appears intact Memory: Impaired Memory Impairment: Decreased recall of new information Awareness: Impaired Awareness Impairment: Anticipatory impairment Problem Solving: Appears intact Safety/Judgment: Impaired Comments: Slightly impulsive Sensation Sensation Light Touch: Appears Intact Proprioception: Appears Intact Coordination Gross Motor Movements are Fluid and Coordinated: No Fine Motor Movements are Fluid and Coordinated: No Heel Shin Test: ataxic LLE >RLE Motor  Motor Motor: Ataxia  Mobility Bed Mobility Bed Mobility: Sit to Supine;Supine to Sit Supine to Sit: Supervision/Verbal cueing Sit to Supine: Supervision/Verbal cueing Transfers Transfers: Sit to Stand;Stand Pivot Transfers Sit to Stand: Minimal Assistance - Patient > 75% Stand Pivot Transfers: Minimal Assistance - Patient > 75% Stand Pivot Transfer Details: Verbal cues for precautions/safety;Verbal cues for technique;Tactile cues for weight shifting;Tactile cues for placement Transfer (Assistive device): None Locomotion  Gait Ambulation: Yes Gait Assistance: Minimal Assistance - Patient > 75% Gait Distance (Feet): 150 Feet Assistive device: Rolling walker Gait Assistance Details: Verbal  cues for technique;Verbal cues for precautions/safety Gait Gait: Yes Gait Pattern: Impaired Gait Pattern: Shuffle;Ataxic;Poor foot clearance - right;Poor foot clearance - left Gait velocity: 1.88 ft/sec Stairs / Additional Locomotion Stairs: Yes Stairs Assistance: Minimal Assistance - Patient > 75% Stair Management Technique: Alternating pattern;Forwards;Two rails Number of Stairs: 12 Height of Stairs: 6 Ramp: Minimal Assistance - Patient >75% Curb: Minimal Assistance - Patient >75% Wheelchair Mobility Wheelchair Mobility: Yes Wheelchair Assistance: Moderate Assistance - Patient 50 - 74% Wheelchair Propulsion: Both upper extremities Wheelchair Parts Management: Needs assistance Distance: 100'  Trunk/Postural Assessment  Cervical Assessment Cervical Assessment: Within Functional Limits Thoracic  Assessment Thoracic Assessment: Within Functional Limits Lumbar Assessment Lumbar Assessment: Within Functional Limits Postural Control Postural Control: Deficits on evaluation(delayed/ineffective ankle/stepping reactions in standing)  Balance Balance Balance Assessed: Yes Static Sitting Balance Static Sitting - Balance Support: No upper extremity supported Static Sitting - Level of Assistance: 6: Modified independent (Device/Increase time) Dynamic Sitting Balance Dynamic Sitting - Balance Support: No upper extremity supported;Feet supported;During functional activity Dynamic Sitting - Level of Assistance: 5: Stand by assistance Dynamic Sitting - Balance Activities: Lateral lean/weight shifting;Forward lean/weight shifting;Reaching for objects Static Standing Balance Static Standing - Balance Support: During functional activity;Bilateral upper extremity supported Static Standing - Level of Assistance: 5: Stand by assistance Dynamic Standing Balance Dynamic Standing - Balance Support: During functional activity;Bilateral upper extremity supported Dynamic Standing - Level of  Assistance: 4: Min assist Dynamic Standing - Balance Activities: Lateral lean/weight shifting;Forward lean/weight shifting;Reaching across midline;Reaching for objects Extremity Assessment      RLE Assessment RLE Assessment: Exceptions to [facility] Passive Range of Motion (PROM) Comments: WFL Active Range of Motion (AROM) Comments: grosly 4/5 throughout LLE Assessment LLE Assessment: Exceptions to [facility] Passive Range of Motion (PROM) Comments: WFL Active Range of Motion (AROM) Comments: grossly 4/5 throughout    Refer to Care Plan for Long Term Goals  Recommendations for other services: Therapeutic Recreation  Outing/community reintegration  Discharge Criteria: Patient will be discharged from PT if patient refuses treatment 3 consecutive times without medical reason, if treatment goals not met, if there is a change in medical status, if patient makes no progress towards goals or if patient is discharged from hospital.  The above assessment, treatment plan, treatment alternatives and goals were discussed and mutually agreed upon: by patient  Corliss Skains 06/17/2018, 7:48 AM

## 2018-06-17 NOTE — Progress Notes (Signed)
Social Work  Social Work Assessment and Plan  Patient Details  Name: Jason WECKERLY Sr. MRN: [id number] Date of Birth: 14-May-1946  Today's Date: 06/17/2018  Problem List:  Patient Active Problem List   Diagnosis Date Noted  . ESRD on dialysis ([place]) 06/16/2018  . Mild malnutrition ([place]) 06/16/2018  . Stroke due to embolism ([place]) 06/16/2018  . Acute respiratory insufficiency   . Chronic systolic (congestive) heart failure ([place])   . Acute ischemic stroke ([place]) 06/08/2018  . Middle cerebral artery embolism, right s/p Mechanical Thrombectomy 06/08/2018  . CHF (congestive heart failure) ([place]) 05/08/2018  . Normocytic anemia 05/08/2018  . Hyperlipidemia LDL goal <70 09/23/2017  . OAB (overactive bladder) 01/09/2016  . SUI (stress urinary incontinence), male 01/09/2016  . Hypersomnia 10/30/2015  . Obesity (BMI 30.0-34.9) 10/30/2015  . Routine general medical examination at a health care facility 06/04/2015  . Medicare annual wellness visit, subsequent 06/04/2015  . Hereditary and idiopathic peripheral neuropathy 09/14/2014  . Rectal cancer ([place]) 02/01/2013  . Chronic renal insufficiency 07/31/2011  . Obstructive sleep apnea 07/29/2010  . GOUT 05/11/2007  . ERECTILE DYSFUNCTION, MILD 05/11/2007  . Essential hypertension 05/11/2007   Past Medical History:  Past Medical History:  Diagnosis Date  . Anemia   . Arthritis   . Chronic renal insufficiency 07/31/2011  . COPD (chronic obstructive pulmonary disease) ([place])    pt denies  . Deficiency anemia 07/20/2012  . Diverticulosis   . Edema 09/21/2010   Qualifier: Diagnosis of  By: Jerold Coombe    . ERECTILE DYSFUNCTION, MILD 05/11/2007   Qualifier: Diagnosis of  By: Sherren Mocha MD, Jory Ee   . Exertional dyspnea 10/30/2015  . GOUT 05/11/2007   Qualifier: Diagnosis of  By: Sherren Mocha MD, Jory Ee   . Hereditary and idiopathic peripheral neuropathy 09/14/2014  . History of radiation therapy 02/21/13-03/31/13   rectum 50.4Gy total dose  .  Hypersomnia 10/30/2015  . Hypertension   . Lung nodule 10/30/2015  . OAB (overactive bladder) 01/09/2016  . Obesity (BMI 30.0-34.9) 10/30/2015  . Obstructive sleep apnea 07/29/2010   HST 11/2015 AHI 54.  On autocpap  >> 10 cm   . Peripheral edema 09/19/2013  . rectal ca dx'd 02/01/13   rectal. Radiation and chemotherapy- remains on chemotherapy-next tx. 08-22-13.   . S/P partial lobectomy of lung 11/20/2015  . S/P thoracotomy   . SUI (stress urinary incontinence), male 01/09/2016   Past Surgical History:  Past Surgical History:  Procedure Laterality Date  . A/V FISTULAGRAM Left 06/14/2018   Procedure: A/V FISTULAGRAM;  Surgeon: Waynetta Sandy, MD;  Location: [place] [facility];  Service: Cardiovascular;  Laterality: Left;  . AV FISTULA PLACEMENT Left 03/24/2016   Procedure: CREATION OF LEFT BRACIOCEPHALIC ARTERIOVENOUS (AV) FISTULA;  Surgeon: Elam Dutch, MD;  Location: [facility];  Service: Vascular;  Laterality: Left;  . COLON SURGERY  05/20/2013  . COLONOSCOPY  2010   [facility]  . EUS N/A 02/03/2013   Procedure: LOWER ENDOSCOPIC ULTRASOUND (EUS);  Surgeon: Milus Banister, MD;  Location[facility];  Service: Endoscopy;  Laterality: N/A;  . ILEOSTOMY CLOSURE N/A 08/24/2013   Procedure: CLOSURE OF LOOP ILEOSTOMY ;  Surgeon: Leighton Ruff, MD;  [facility];  Service: General;  Laterality: N/A;  . IR ANGIO VERTEBRAL SEL SUBCLAVIAN INNOMINATE BILAT MOD SED  06/08/2018  . IR CT HEAD LTD  06/08/2018  . IR INTRAVSC STENT CERV CAROTID W/O EMB-PROT MOD SED INC ANGIO  06/08/2018  . IR PERCUTANEOUS  ART THROMBECTOMY/INFUSION INTRACRANIAL INC DIAG ANGIO  06/08/2018  . LAPAROSCOPIC LOW ANTERIOR RESECTION N/A 05/20/2013   Procedure: LAPAROSCOPIC LOW ANTERIOR RESECTION WITH SPLENIC FLEXURE MOBILIZATION;  Surgeon: Leighton Ruff, MD;  [facility];  Service: General;  Laterality: N/A;  . OSTOMY N/A 05/20/2013   Procedure: diverting OSTOMY;  Surgeon: Leighton Ruff, MD;  [facility];   Service: General;  Laterality: N/A;  . PERIPHERAL VASCULAR INTERVENTION Left 06/14/2018   Procedure: PERIPHERAL VASCULAR INTERVENTION;  Surgeon: Waynetta Sandy, MD;  Location: [place] [facility];  Service: Cardiovascular;  Laterality: Left;  AVF on the Left  . RADIOLOGY WITH ANESTHESIA N/A 06/08/2018   Procedure: IR WITH ANESTHESIA;  Surgeon: Radiologist, Medication, MD;  Location: [place];  Service: Radiology;  Laterality: N/A;  . VIDEO ASSISTED THORACOSCOPY (VATS)/WEDGE RESECTION Left 11/20/2015   Procedure: VIDEO ASSISTED THORACOSCOPY (VATS)/LUNG RESECTION;  Surgeon: [name], MD;  Location: [place];  Service: Thoracic;  Laterality: Left;  Marland Kitchen VIDEO BRONCHOSCOPY N/A 11/20/2015   Procedure: VIDEO BRONCHOSCOPY;  Surgeon: [name], MD;  Location: [place];  Service: Thoracic;  Laterality: N/A;   Social History:  reports that he quit smoking about 40 years ago. His smoking use included cigarettes. He has a 5.00 pack-year smoking history. He has never used smokeless tobacco. He reports current alcohol use. He reports that he does not use drugs.  Family / Support Systems Marital Status: Married Patient Roles: Spouse, Parent Spouse/Significant Other: Phyllis [phone] Children: Shaun-son [phone] [name]-son [phone] Other Supports: Friends and church members Anticipated Caregiver: Wife Ability/Limitations of Caregiver: no limitations in good health Family Dynamics: Clsoe knit family they have two boys who are local and involved and will assist their Mom. Pt feels he will get independent and will not need help at discharge. They have friends and church members who are supportive also.  Social History Preferred language: English Religion: Baptist Cultural Background: No issues Education: [employer] Read: Yes Write: Yes Employment Status: Retired Public relations account executive Issues: No issues Guardian/Conservator: None-according to MD pt is capable  of making his own decisions while here.  Wife plans to be here daily to see his progress   Abuse/Neglect Abuse/Neglect Assessment Can Be Completed: Yes Physical Abuse: Denies Verbal Abuse: Denies Sexual Abuse: Denies Exploitation of patient/patient's resources: Denies Self-Neglect: Denies  Emotional Status Pt's affect, behavior and adjustment status: Pt is motivated to do well and regain his independence again. He is willing to put forth the effort to achieve his goals. His wife reports he is motivated and wants to do for himself. He does not like asking for assist. Recent Psychosocial Issues: other health issues got HD graft in 2017 and thought everything was ready for HD to begin. New to HD this week. Psychiatric History: No history deferred depression screen due to adjusting to the new unit and to rehab. Will monitor and see if seeng neuro-psych while here would be beneficial. Substance Abuse History: No issues  Patient / Family Perceptions, Expectations & Goals Pt/Family understanding of illness & functional limitations: Pt and wife can explain his stroke and HD now. They do talk with the MD's and feel they have a good understanding of his deficits and his treatment plan going forward. Wife is here for his first day to see how it is going. Premorbid pt/family roles/activities: Husband, parent, retiree, church member, home owner, etc Anticipated changes in roles/activities/participation: resume Pt/family expectations/goals: Pt states: " I want to do for myself I don't like asking for help."  Wife states: " I hope he does well here and regains his strength, he is hard headed and will not ask for help."  [employer]: None Premorbid Home Care/DME Agencies: None Transportation available at discharge: Family Resource referrals recommended: Support group (specify)  Discharge Planning Living Arrangements: Spouse/significant other Support Systems: Spouse/significant  other, Children, Friends/neighbors Type of Residence: Private residence Administrator, sports: Multimedia programmer (specify)([employer]-Medicare) Financial Resources: [place] Referred: No Living Expenses: Own Money Management: Spouse, Patient Does the patient have any problems obtaining your medications?: No Home Management: Wife Patient/Family Preliminary Plans: Return home with wife providing assistance if needed. She is hoping the HD will be the [place] close to their home, it is just  5 minutes away. Will work on arrangements for this and await team regarding evaluations. Have encouraged wife to be here for therapies so she can see his progress while here. Work on discharge needs. Social Work Anticipated Follow Up Needs: HH/OP, Support Group  Clinical Impression Pleasant couple who will work together and provide the care pt will need. He is motivated to do well and recover form his stroke and adjust to the HD he will be getting now. Will work on arrangements for this and other needs. Await therapy team evaluations and work on needs. Pt is doing well already should be a short length of stay.  Elease Hashimoto 06/17/2018, 11:15 AM

## 2018-06-17 NOTE — Progress Notes (Signed)
[facility] ROUNDING NOTE   Subjective:   Tolerated dialysis 06/15/2018.Marland Kitchen  Appreciate Dr. Servando Snare placement of stent left cephalic arch 93/02/1016     removal 2.2 l  Blood pressure 135/66 pulse 96 temperature 98.8 O2 sats 100%  Sodium 136 potassium 3.7 chloride 95 CO2 27 BUN 43 creatinine 4.96 glucose 117 calcium 9.5 phosphorus 3.0 Albumin 2.6 WBC 14.5 hemoglobin 7.7 platelets 215   Objective:  Vital signs in last 24 hours:  Temp:  [98.2 F (36.8 C)-99.3 F (37.4 C)] 98.8 F (37.1 C) (12/12 0631) Pulse Rate:  [96-116] 96 (12/12 0631) Resp:  [16-20] 18 (12/12 0631) BP: (77-135)/(48-75) 135/66 (12/12 0631) SpO2:  [100 %] 100 % (12/12 0631) Weight:  [63.9 kg-68.9 kg] 63.9 kg (12/12 0631)  Weight change:  Filed Weights   06/16/18 1832 06/17/18 0631  Weight: 67.4 kg 63.9 kg    Intake/Output: No intake/output data recorded.   Intake/Output this shift:  Total I/O In: 150 [P.O.:150] Out: -   CVS- RRR RS- CTA ABD- BS present soft non-distended EXT-left upper extremity AV fistula tortuous   Basic Metabolic Panel: Recent Labs  Lab 06/11/18 1854  06/13/18 0539 06/14/18 0427 06/15/18 2155 06/16/18 1121 06/16/18 1800  NA 131*   < > 133* 133* 129* 133* 136  K 4.6   < > 5.4* 5.9* 5.0 4.6 3.7  CL 97*   < > 97* 98 96* 95* 95*  CO2 18*   < > 20* 21* 21* 23 27  GLUCOSE 105*   < > 86 90 94 100* 117*  BUN 95*   < > 107* 114* 105* 79* 43*  CREATININE 9.83*   < > 10.47* 10.16* 9.19* 7.48* 4.96*  CALCIUM 9.0   < > 9.5 9.5 9.8 9.6 9.5  PHOS 5.1*  --   --   --  5.1* 4.3 3.0   < > = values in this interval not displayed.    Liver Function Tests: Recent Labs  Lab 06/11/18 1854 06/15/18 2155 06/15/18 2156 06/16/18 1121 06/16/18 1800  ALT  --   --  12  --   --   ALBUMIN 2.6* 2.6*  --  2.6* 2.6*   No results for input(s): LIPASE, AMYLASE in the last 168 hours. No results for input(s): AMMONIA in the last 168 hours.  CBC: Recent Labs  Lab  06/13/18 0539 06/14/18 0427 06/15/18 0540 06/16/18 1121 06/16/18 1845  WBC 8.6 9.5 9.1 13.8* 14.5*  HGB 8.1* 7.7* 7.3* 7.3* 7.7*  HCT 25.7* 23.7* 22.6* 23.4* 24.9*  MCV 86.2 84.6 85.9 86.7 87.4  PLT 129* 146* 209 184 215    Cardiac Enzymes: No results for input(s): CKTOTAL, CKMB, CKMBINDEX, TROPONINI in the last 168 hours.  BNP: Invalid input(s): POCBNP  CBG: No results for input(s): GLUCAP in the last 168 hours.  Microbiology: Results for orders placed or performed during the hospital encounter of 06/08/18  MRSA PCR Screening     Status: None   Collection Time: 06/08/18  9:46 PM  Result Value Ref Range Status   MRSA by PCR NEGATIVE NEGATIVE Final    Comment:        The GeneXpert MRSA Assay (FDA approved for NASAL specimens only), is one component of a comprehensive MRSA colonization surveillance program. It is not intended to diagnose MRSA infection nor to guide or monitor treatment for MRSA infections. Performed at [facility], [place] [address] [place], [place] [address]     Coagulation Studies: No results  for input(s): LABPROT, INR in the last 72 hours.  Urinalysis: No results for input(s): COLORURINE, LABSPEC, PHURINE, GLUCOSEU, HGBUR, BILIRUBINUR, KETONESUR, PROTEINUR, UROBILINOGEN, NITRITE, LEUKOCYTESUR in the last 72 hours.  Invalid input(s): APPERANCEUR    Imaging: No results found.   Medications:   . sodium chloride     . allopurinol  100 mg Oral Daily  . aspirin  81 mg Oral Daily  . atorvastatin  20 mg Oral q1800  . chlorhexidine  15 mL Mouth Rinse BID  . Chlorhexidine Gluconate Cloth  6 each Topical Q0600  . cholecalciferol  5,000 Units Oral Daily  . [START ON 06/22/2018] darbepoetin (ARANESP) injection - DIALYSIS  100 mcg Intravenous Q Tue-HD  . doxazosin  8 mg Oral Daily  . enoxaparin (LOVENOX) injection  30 mg Subcutaneous Q24H  . gabapentin  300 mg Oral QHS  . multivitamin  1 tablet Oral QHS  . pantoprazole  40 mg Oral Daily   . ticagrelor  90 mg Oral BID   sodium chloride, acetaminophen, aluminum hydroxide, bisacodyl, diphenhydrAMINE, guaiFENesin-dextromethorphan, heparin, lidocaine (PF), lidocaine-prilocaine, pentafluoroprop-tetrafluoroeth, polyethylene glycol, prochlorperazine **OR** prochlorperazine **OR** prochlorperazine, traMADol, traZODone  Assessment/ Plan:   Right MCA infarct due to right ICA and MCA occlusion status post IR rescue stents with reperfusion.  Dilated cardiomyopathy ejection fraction 20 to 25%  End-stage renal disease stage V status post stent to cephalic artery  Hyperkalemia stable received dialysis 06/15/2018 next dialysis treatment 62/13/0865  Metabolic bone disease PTH 161 we will start Rocaltrol 0.25 mcg daily  Anemia iron saturations 20% started darbepoetin  History of rectal cancer status post radiation and chemotherapy 2014  History of COPD  Status post revision stent to AV graft 06/14/2018   LOS: [place] @TODAY @10 :35 AM

## 2018-06-17 NOTE — Evaluation (Signed)
Speech Language Pathology Assessment and Plan  Patient Details  Name: Jason TIGGS Sr. MRN: [id number] Date of Birth: 11-16-1945  SLP Diagnosis: Dysarthria;Cognitive Impairments  Rehab Potential: Good ELOS: 10-12 days     Today's Date: 06/17/2018 SLP Individual Time: 1638-4536 SLP Individual Time Calculation (min): 15 min   Problem List:  Patient Active Problem List   Diagnosis Date Noted  . ESRD on dialysis ([place]) 06/16/2018  . Mild malnutrition ([place]) 06/16/2018  . Stroke due to embolism ([place]) 06/16/2018  . Acute respiratory insufficiency   . Chronic systolic (congestive) heart failure ([place])   . Acute ischemic stroke ([place]) 06/08/2018  . Middle cerebral artery embolism, right s/p Mechanical Thrombectomy 06/08/2018  . CHF (congestive heart failure) ([place]) 05/08/2018  . Normocytic anemia 05/08/2018  . Hyperlipidemia LDL goal <70 09/23/2017  . OAB (overactive bladder) 01/09/2016  . SUI (stress urinary incontinence), male 01/09/2016  . Hypersomnia 10/30/2015  . Obesity (BMI 30.0-34.9) 10/30/2015  . Routine general medical examination at a health care facility 06/04/2015  . Medicare annual wellness visit, subsequent 06/04/2015  . Hereditary and idiopathic peripheral neuropathy 09/14/2014  . Rectal cancer ([place]) 02/01/2013  . Chronic renal insufficiency 07/31/2011  . Obstructive sleep apnea 07/29/2010  . GOUT 05/11/2007  . ERECTILE DYSFUNCTION, MILD 05/11/2007  . Essential hypertension 05/11/2007   Past Medical History:  Past Medical History:  Diagnosis Date  . Anemia   . Arthritis   . Chronic renal insufficiency 07/31/2011  . COPD (chronic obstructive pulmonary disease) ([place])    pt denies  . Deficiency anemia 07/20/2012  . Diverticulosis   . Edema 09/21/2010   Qualifier: Diagnosis of  By: Jerold Coombe    . ERECTILE DYSFUNCTION, MILD 05/11/2007   Qualifier: Diagnosis of  By: Sherren Mocha MD, Jory Ee   . Exertional dyspnea 10/30/2015  . GOUT 05/11/2007   Qualifier: Diagnosis  of  By: Sherren Mocha MD, Jory Ee   . Hereditary and idiopathic peripheral neuropathy 09/14/2014  . History of radiation therapy 02/21/13-03/31/13   rectum 50.4Gy total dose  . Hypersomnia 10/30/2015  . Hypertension   . Lung nodule 10/30/2015  . OAB (overactive bladder) 01/09/2016  . Obesity (BMI 30.0-34.9) 10/30/2015  . Obstructive sleep apnea 07/29/2010   HST 11/2015 AHI 54.  On autocpap  >> 10 cm   . Peripheral edema 09/19/2013  . rectal ca dx'd 02/01/13   rectal. Radiation and chemotherapy- remains on chemotherapy-next tx. 08-22-13.   . S/P partial lobectomy of lung 11/20/2015  . S/P thoracotomy   . SUI (stress urinary incontinence), male 01/09/2016   Past Surgical History:  Past Surgical History:  Procedure Laterality Date  . A/V FISTULAGRAM Left 06/14/2018   Procedure: A/V FISTULAGRAM;  Surgeon: Waynetta Sandy, MD;  Location: [facility];  Service: Cardiovascular;  Laterality: Left;  . AV FISTULA PLACEMENT Left 03/24/2016   Procedure: CREATION OF LEFT BRACIOCEPHALIC ARTERIOVENOUS (AV) FISTULA;  Surgeon: Elam Dutch, MD;  Location: [facility];  Service: Vascular;  Laterality: Left;  . COLON SURGERY  05/20/2013  . COLONOSCOPY  2010   [facility]  . EUS N/A 02/03/2013   Procedure: LOWER ENDOSCOPIC ULTRASOUND (EUS);  Surgeon: Milus Banister, MD;  Location: [facility];  Service: Endoscopy;  Laterality: N/A;  . ILEOSTOMY CLOSURE N/A 08/24/2013   Procedure: CLOSURE OF LOOP ILEOSTOMY ;  Surgeon: Leighton Ruff, MD;  Location: [facility];  Service: General;  Laterality: N/A;  . IR ANGIO VERTEBRAL SEL SUBCLAVIAN INNOMINATE BILAT MOD SED  06/08/2018  .  IR CT HEAD LTD  06/08/2018  . IR INTRAVSC STENT CERV CAROTID W/O EMB-PROT MOD SED INC ANGIO  06/08/2018  . IR PERCUTANEOUS ART THROMBECTOMY/INFUSION INTRACRANIAL INC DIAG ANGIO  06/08/2018  . LAPAROSCOPIC LOW ANTERIOR RESECTION N/A 05/20/2013   Procedure: LAPAROSCOPIC LOW ANTERIOR RESECTION WITH SPLENIC FLEXURE MOBILIZATION;  Surgeon: Leighton Ruff,  MD;  Location: [facility];  Service: General;  Laterality: N/A;  . OSTOMY N/A 05/20/2013   Procedure: diverting OSTOMY;  Surgeon: Leighton Ruff, MD;  Location: [facility];  Service: General;  Laterality: N/A;  . PERIPHERAL VASCULAR INTERVENTION Left 06/14/2018   Procedure: PERIPHERAL VASCULAR INTERVENTION;  Surgeon: Waynetta Sandy, MD;  Location: [facility];  Service: Cardiovascular;  Laterality: Left;  AVF on the Left  . RADIOLOGY WITH ANESTHESIA N/A 06/08/2018   Procedure: IR WITH ANESTHESIA;  Surgeon: Radiologist, Medication, MD;  Location: [facility];  Service: Radiology;  Laterality: N/A;  . VIDEO ASSISTED THORACOSCOPY (VATS)/WEDGE RESECTION Left 11/20/2015   Procedure: VIDEO ASSISTED THORACOSCOPY (VATS)/LUNG RESECTION;  Surgeon: [name], MD;  Location: [facility];  Service: Thoracic;  Laterality: Left;  Marland Kitchen VIDEO BRONCHOSCOPY N/A 11/20/2015   Procedure: VIDEO BRONCHOSCOPY;  Surgeon: [name], MD;  Location: [facility];  Service: Thoracic;  Laterality: N/A;    Assessment / Plan / Recommendation Clinical Impression   Jason [name] is a 72 year old male with history of HTN, rectal cancer, neuropathy, severe pulmonary HTN, CKD s/p AVF, OSA; who was admitted on 06/08/18 with left sided weakness and severely dysarthric speech.CT/perfusion study showed occluded right ICA,right ICA terminus and MCA, occluded right vertebral artery and soft plaque with thrombus left vertebral artery at origin.He underwent cerebral angiogram with rescue stent and T1c went to be reperfusion. Elevated blood pressures treated with Celebrex and stroke felt to be due to large vessel atherosclerosis. 30-day cardiac event monitor recommended to rule out A. fib as cause. BLE Dopplers negative for DVT. MRI/MRA brain done revealing acute right MCA infarct greatest involvement in basal ganglia.Patient placed on aspirin and Brilinta due to intracranial stents.  2D echo showed EF 20 to 25% consistent with dilated  cardiomyopathy. Nephrology following for input and hyperkalemia treated with [facility] without improvement. Due to difficulty cannulating AV fistula, patient underwent fistulogram with placement of stent in left cephalic arch stenosis. Patient now with ESRD andHDto manage hyperkalemia.Patient with resultant left sided weakness with dysarthria, delayed processing with poor awareness of deficits. Hecontinues to have limitations in ADLs and mobility and CIR recommended follow-up therapy.  SLP evaluation was completed on 06/17/2018 with the following results:  Pt presents with a mild dysarthria resulting from mild left sided oral motor weakness.  Pt reports that he feels his speech is near his baseline, wife endorses that she does not have significant difficulty understanding him but therapist did have to ask pt to repeat himself several times throughout evaluation due to rapid rate of speech and low vocal intensity.  PT/OT also mentioned concerns over cognitive deficits noted during their evaluations; however, SLP was unable to formally assess cognition today as transport arrived to take pt to dialysis.  As a result, would recommend formal cognitive assessment on next date.   Pt would benefit from skilled ST while inpatient in order to maximize functional independence and reduce burden of care prior to discharge.  Anticipate that pt will need 24/7 supervision at discharge in addition to possible ST follow up at next level of care.     Skilled Therapeutic Interventions  Cognitive-linguistic evaluation completed with results and recommendations reviewed with patient and family.     SLP Assessment  Patient will need skilled [facility] during CIR admission    Recommendations  Patient destination: Home Follow up Recommendations: 24 hour supervision/assistance;Home Health SLP Equipment Recommended: None recommended by SLP    SLP Frequency 3 to 5 out of 7 days   SLP  Duration  SLP Intensity  SLP Treatment/Interventions 10-12 days   Minumum of 1-2 x/day, 30 to 90 minutes  Cueing hierarchy;Environmental controls;Functional tasks;Internal/external aids;Speech/Language facilitation;Patient/family education    Pain Pain Assessment Pain Scale: 0-10 Pain Score: 0-No pain  Prior Functioning Cognitive/Linguistic Baseline: Within functional limits Type of Home: House  Lives With: Spouse Available Help at Discharge: Family;Available 24 hours/day Vocation: Retired  Industrial/product designer Term Goals: Week 1: SLP Short Term Goal 1 (Week 1): Pt will overarticulate, increase vocal intensity, and use slow rate of speech to achieve intelligibility in conversations   Refer to Care Plan for Long Term Goals  Recommendations for other services: None   Discharge Criteria: Patient will be discharged from SLP if patient refuses treatment 3 consecutive times without medical reason, if treatment goals not met, if there is a change in medical status, if patient makes no progress towards goals or if patient is discharged from hospital.  The above assessment, treatment plan, treatment alternatives and goals were discussed and mutually agreed upon: by patient and by family  Kalimah Capurro, Selinda Orion 06/17/2018, 1:17 PM

## 2018-06-17 NOTE — Telephone Encounter (Signed)
Pt was on TCM report admitted 06/08/18 forfor slurry speech, left sided weakness. Pt had testing done Dx w/  Middle cerebral artery embolism from the right carotid artery occlusion, right s/p successful Mechanical Thrombectomy with rescue stenting of right MCA. Pt is being Transfer to [facility] for ongoing PT, OT and ST 06/16/18, and will need to follow-up [name], MD in 2 weeks following discharge from rehab.Marland KitchenJohny Chess

## 2018-06-18 ENCOUNTER — Inpatient Hospital Stay (HOSPITAL_COMMUNITY): Payer: Medicare Other | Admitting: Physical Therapy

## 2018-06-18 ENCOUNTER — Inpatient Hospital Stay (HOSPITAL_COMMUNITY): Payer: Medicare Other | Admitting: Occupational Therapy

## 2018-06-18 ENCOUNTER — Inpatient Hospital Stay (HOSPITAL_COMMUNITY): Payer: Medicare Other | Admitting: Speech Pathology

## 2018-06-18 MED ORDER — CHLORHEXIDINE GLUCONATE CLOTH 2 % EX PADS
6.0000 | MEDICATED_PAD | Freq: Every day | CUTANEOUS | Status: DC
  Administered 2018-06-18 – 2018-06-19 (×2): 6 via TOPICAL

## 2018-06-18 NOTE — Progress Notes (Signed)
[facility] PHYSICAL MEDICINE & REHABILITATION PROGRESS NOTE   Subjective/Complaints:    ROS- denies CP, SOB, N/V/D     Objective:   No results found. Recent Labs    06/16/18 1121 06/16/18 1845  WBC 13.8* 14.5*  HGB 7.3* 7.7*  HCT 23.4* 24.9*  PLT 184 215   Recent Labs    06/16/18 1121 06/16/18 1800  NA 133* 136  K 4.6 3.7  CL 95* 95*  CO2 23 27  GLUCOSE 100* 117*  BUN 79* 43*  CREATININE 7.48* 4.96*  CALCIUM 9.6 9.5    Intake/Output Summary (Last 24 hours) at 06/18/2018 0730 Last data filed at 06/17/2018 1817 Gross per 24 hour  Intake 268 ml  Output 1911 ml  Net -1643 ml     Physical Exam: Vital Signs Blood pressure 118/66, pulse 96, temperature 97.9 F (36.6 C), resp. rate 18, height 5\' 8"  (1.727 m), weight 64.6 kg, SpO2 99 %.     Assessment/Plan: 1. Functional deficits secondary to RIght MCA infarct which require 3+ hours per day of interdisciplinary therapy in a comprehensive inpatient rehab setting.  Physiatrist is providing close team supervision and 24 hour management of active medical problems listed below.  Physiatrist and rehab team continue to assess barriers to discharge/monitor patient progress toward functional and medical goals  Care Tool:  Bathing    Body parts bathed by patient: Right arm, Left arm, Chest, Abdomen, Front perineal area, Buttocks, Right upper leg, Left upper leg, Right lower leg, Left lower leg, Face         Bathing assist Assist Level: Minimal Assistance - Patient > 75%     Upper Body Dressing/Undressing Upper body dressing   What is the patient wearing?: Pull over shirt    Upper body assist Assist Level: Moderate Assistance - Patient 50 - 74%    Lower Body Dressing/Undressing Lower body dressing      What is the patient wearing?: Pants     Lower body assist Assist for lower body dressing: Minimal Assistance - Patient > 75%     Toileting Toileting    Toileting assist Assist for toileting:  Moderate Assistance - Patient 50 - 74%     Transfers Chair/bed transfer  Transfers assist     Chair/bed transfer assist level: Minimal Assistance - Patient > 75%     Locomotion Ambulation   Ambulation assist      Assist level: Minimal Assistance - Patient > 75% Assistive device: Walker-rolling Max distance: 150'   Walk 10 feet activity   Assist     Assist level: Minimal Assistance - Patient > 75% Assistive device: Walker-rolling   Walk 50 feet activity   Assist    Assist level: Minimal Assistance - Patient > 75% Assistive device: Walker-rolling    Walk 150 feet activity   Assist    Assist level: Minimal Assistance - Patient > 75% Assistive device: Walker-rolling    Walk 10 feet on uneven surface  activity   Assist     Assist level: Moderate Assistance - Patient - 50 - 74% Assistive device: Aeronautical engineer Will patient use wheelchair at discharge?: No      Wheelchair assist level: Moderate Assistance - Patient 50 - 74% Max wheelchair distance: 100    Wheelchair 50 feet with 2 turns activity    Assist        Assist Level: Moderate Assistance - Patient 50 - 74%   Wheelchair 150 feet activity  Assist Wheelchair 150 feet activity did not occur: Safety/medical concerns        Medical Problem List and Plan: 1. Decrease mobility and ADLs secondary to Right MCA infarct PT, OT,CIR level 2. DVT Prophylaxis/Anticoagulation: Pharmaceutical:Lovenox reduce to 30mg  per day 3. Pain Management:Tylenol as needed 4. Mood:LCSW to follow for evaluation and support 5. Neuropsych: This patient is capable of making decisions on his own behalf. 6. Skin/Wound Care:Routine pressure relief measures 7. Fluids/Electrolytes/Nutrition:Monitor I's and O's.only 212ml recorded on 12/12, meal intake 30-100% 8. ESRD stage V: hyperkalemia to be treated with HD M W F .Per Nephro 9. Anemia of chronic disease: Started on  darbepoetinfor supplementation, Hgb stable mid 7 range 10. HTN: Monitor bid. Continue Cardura.  Vitals:   06/17/18 2306 06/18/18 0624  BP: (!) 111/42 118/66  Pulse: (!) 102 96  Resp: 18 18  Temp: 99 F (37.2 C) 97.9 F (36.6 C)  SpO2: 100% 99%  Controlled 12/13 11. Neuropathy: Decrease gabapentin to renal dose--once daily. 12. OSA: Continue to use CPAP when napping or when asleep.    LOS: 2 days A FACE TO FACE EVALUATION WAS PERFORMED  Charlett Blake 06/18/2018, 7:30 AM

## 2018-06-18 NOTE — Progress Notes (Signed)
Physical Therapy Session Note  Patient Details  Name: Jason [name]. MRN: [id number] Date of Birth: 04-17-1946  Today's Date: 06/18/2018 PT Individual Time: 1100-1200 PT Individual Time Calculation (min): 60 min   Short Term Goals: Week 1:  PT Short Term Goal 1 (Week 1): Pt will perform bed mobility modI PT Short Term Goal 2 (Week 1): Pt will perform transfers with S PT Short Term Goal 3 (Week 1): Pt will ambulate 150' with RW and min guard PT Short Term Goal 4 (Week 1): Pt will perform ascent/descent 12 steps with B handrails and min guard for strengthening and endurance  Skilled Therapeutic Interventions/Progress Updates:   Pt received seated in WC and agreeable to PT.   Self propelled WC 50 ft down hall with min assist for UE sequencing and verbal cues to keep WC from veering to the L.   Gait training x163f with RW at min assist. Verbal cues for safety/AD awareness. Verbal cues for awareness of placement of WC during stand pivot to return to seated. Noted slight inattention to L throughout session; needed verbal cues to maintain awareness.   Return to room for toileting (+BM). Min assist stand pivot with RW. Min assist for pericare. Pt CGA for LE clothing management; pt able to maintain standing balance without use of RW while donning pants/brief. CGA for pt safety while standing at sink to wash hands. [facility] and verbal cues for spatial awareness. Transported to dayroom total assist for time management.  Nustep x483m, x3m93m Stand pivot with RW to nustep, min assist. Verbal cues to maintain eccentric control and slow speed while on nustep.   Assessed BP in seated and standing. BP found to be 105/66 (125 HR) in seated after 5 min rest post nustep exercise. In standing, BP dropped to 87/53 (128 HR). Pt reported he was a "little bit dizzy but not too bad." Applied ted hose; BP in standing with ted hose was 81/47. BP seated in WC before returning to room  was 113/57 (97 HR). RN notified.  Ended session with pt seated in WC, call bell within reach, and all needs met.  Therapy Documentation Precautions:  Precautions Precautions: Fall Restrictions Weight Bearing Restrictions: No Therapy Vitals Pulse Rate: 97 BP: (!) 113/57 Patient Position (if appropriate): Sitting Pain: Pain Assessment Pain Scale: 0-10 Pain Score: 0-No pain   Therapy/Group: Individual Therapy  AmaAmador Cunas/13/2019, 1:04 PM

## 2018-06-18 NOTE — Progress Notes (Signed)
Speech Language Pathology Daily Session Note  Patient Details  Name: Jason MCDILL Sr. MRN: [id number] Date of Birth: 1946/07/02  Today's Date: 06/18/2018 SLP Individual Time: 1004-1100 SLP Individual Time Calculation (min): 56 min  Short Term Goals: Week 1: SLP Short Term Goal 1 (Week 1): Pt will overarticulate, increase vocal intensity, and use slow rate of speech to achieve intelligibility in conversations  SLP Short Term Goal 2 (Week 1): Pt will selectively attend to tasks in a mildly distracting environment for 15 minute intervals with min cues for redirection.  SLP Short Term Goal 3 (Week 1): Pt will complete functional tasks with min assist for functional problem solving.   SLP Short Term Goal 4 (Week 1): Pt will recall daily information with min assist cues for use of external aids.    Skilled Therapeutic Interventions:  Pt was seen for skilled ST targeting ongoing diagnostic treatment of cognition.  SLP administered the Cognistat with pt scoring in the moderately impaired range across domains for memory, problem solving, and attention.  Pt required more than a reasonable amount of time to complete tasks.  Discussed findings with pt and his wife who were in agreement with recommended plan of care to initiate cognitive goals.  Pt was left in wheelchair with wife at bedside.  Continue per current plan of care.    Pain Pain Assessment Pain Scale: 0-10 Pain Score: 0-No pain  Therapy/Group: Individual Therapy  Maxime Beckner, Selinda Orion 06/18/2018, 12:31 PM

## 2018-06-18 NOTE — IPOC Note (Addendum)
Overall Plan of Care (IPOC) Patient Details Name: Jason SZCZESNIAK Sr. MRN: [id number] DOB: 1946-04-01  Admitting Diagnosis: Left Hemiparesis due to Right basal ganglia infarct Hospital Problems: Active Problems:   Stroke due to embolism [facility])     Functional Problem List: Nursing Bladder, Bowel, Safety, Motor  PT Balance, Endurance, Motor, Safety, Perception  OT Balance, Cognition, Safety, Sensory, Endurance, Motor  SLP Cognition, Linguistic  TR         Basic ADL's: OT Eating, Grooming, Bathing, Dressing, Toileting     Advanced  ADL's: OT       Transfers: PT Bed Mobility, Bed to Chair, Car, Manufacturing systems engineer, Metallurgist: PT Ambulation, Emergency planning/management officer, Stairs     Additional Impairments: OT Fuctional Use of Upper Extremity  SLP Communication expression    TR      Anticipated Outcomes Item Anticipated Outcome  Self Feeding Mod I  Swallowing      Basic self-care  Supervision  Toileting  Supervision   Bathroom Transfers Supervision  Bowel/Bladder  Patient will be continent of bowel and bladder during admission  Transfers  modI  Locomotion  S overall LRAD  Communication  mod I   Cognition     Pain  Paitent will continue to be pain free or pain less than 3 during admission  Safety/Judgment  Patient will be free from fall and adhere to safety plan during admission   Therapy Plan: PT Intensity: Minimum of 1-2 x/day ,45 to 90 minutes PT Frequency: 5 out of 7 days PT Duration Estimated Length of Stay: 10-12 days OT Intensity: Minimum of 1-2 x/day, 45 to 90 minutes OT Frequency: 5 out of 7 days OT Duration/Estimated Length of Stay: 10-12 days SLP Intensity: Minumum of 1-2 x/day, 30 to 90 minutes SLP Frequency: 3 to 5 out of 7 days SLP Duration/Estimated Length of Stay: 10-12 days     Team Interventions: Nursing Interventions Patient/Family Education, Bowel Management, Bladder Management, Disease Management/Prevention  PT  interventions Ambulation/gait training, Discharge planning, Functional mobility training, Psychosocial support, Therapeutic Activities, Visual/perceptual remediation/compensation, Wheelchair propulsion/positioning, Therapeutic Exercise, Skin care/wound management, Neuromuscular re-education, Disease management/prevention, Training and development officer, Cognitive remediation/compensation, DME/adaptive equipment instruction, Pain management, UE/LE Strength taining/ROM, UE/LE Coordination activities, Stair training, Patient/family education, Community reintegration  OT Interventions Training and development officer, Discharge planning, Pain management, Functional electrical stimulation, Self Care/advanced ADL retraining, Therapeutic Activities, UE/LE Coordination activities, Therapeutic Exercise, Patient/family education, Functional mobility training, Cognitive remediation/compensation, Disease mangement/prevention, UE/LE Strength taining/ROM, Splinting/orthotics, Neuromuscular re-education, Psychosocial support, Community reintegration, DME/adaptive equipment instruction  SLP Interventions Cueing hierarchy, Environmental controls, Functional tasks, Internal/external aids, Speech/Language facilitation, Patient/family education  TR Interventions    SW/CM Interventions Discharge Planning, Patient/Family Education, Psychosocial Support   Barriers to Discharge MD  Medical stability and Hemodialysis  Nursing      PT      OT      SLP      SW       Team Discharge Planning: Destination: PT-Home ,OT- Home , SLP-Home Projected Follow-up: PT-Home health PT, 24 hour supervision/assistance, OT-  Outpatient OT, SLP-24 hour supervision/assistance, Home Health SLP Projected Equipment Needs: PT-Rolling walker with 5" wheels, OT- To be determined, SLP-None recommended by SLP Equipment Details: PT- , OT-  Patient/family involved in discharge planning: PT- Patient,  OT-Patient, SLP-Patient, Family member/caregiver  MD  ELOS: 10-14d Medical Rehab Prognosis:  Good Assessment:  72 year old male with history of HTN, rectal cancer, neuropathy, severe pulmonary HTN, CKD s/p AVF, OSA; who was  admitted on 06/08/18 with left sided weakness and severely dysarthric speech.CT/perfusion study showed occluded right ICA,right ICA terminus and MCA, occluded right vertebral artery and soft plaque with thrombus left vertebral artery at origin.He underwent cerebral angiogram with rescue stent and T1c went to be reperfusion. Elevated blood pressures treated with Cleviprex and stroke felt to be due to large vessel atherosclerosis. 30-day cardiac event monitor recommended to rule out A. fib as cause. BLE Dopplers negative for DVT. MRI/MRA brain done revealing acute right MCA infarct greatest involvement in basal ganglia.Patient placed on aspirin and Brilinta due to intracranial stents.  2D echo showed EF 20 to 25% consistent with dilated cardiomyopathy. Nephrology following for input and hyperkalemia treated with [facility] without improvement. Due to difficulty cannulating AV fistula, patient underwent fistulogram with placement of stent in left cephalic arch stenosis. Patient now with ESRD andHDto manage hyperkalemia.Patient with resultant left sided weakness with dysarthria, delayed processing with poor awareness of deficits.    Now requiring 24/7 Rehab RN,MD, as well as CIR level PT, OT and SLP.  Treatment team will focus on ADLs and mobility with goals set at Sup See Team Conference Notes for weekly updates to the plan of care

## 2018-06-18 NOTE — Progress Notes (Signed)
Occupational Therapy Session Note  Patient Details  Name: Jason [name]. MRN: [id number] Date of Birth: 10/04/1945  Today's Date: 06/18/2018 OT Individual Time: 2919-1660 OT Individual Time Calculation (min): 83 min    Short Term Goals: Week 1:  OT Short Term Goal 1 (Week 1): Pt will stand to compete 2 grooming tasks with supervision in order to increase functional activity tolerance OT Short Term Goal 2 (Week 1): Pt will complete toilet transfer with supervision OT Short Term Goal 3 (Week 1): Pt will independently recall hemi-dressing techniques for UB and LB  Skilled Therapeutic Interventions/Progress Updates:    Pt seen for OT ADL bathing/dressing session. Pt in supine upon arrival, awake and agreeable to tx session, denying any pain. He transferred to EOB with min A and VCs for technique. He sat EOB to eat breakfast, able to use L UE at gross assist level to open containers in prep for meal as well as self feeding bites of muffin with L hand with increased time and effort.  Throughout session, he ambulated with RW and min A, min cuing for RW management in functional context. He completed toileting task with steadying assist standing at [place]. He transitioned into shower and bathed seated on tub transfer bench, steadying assist when standing to complete buttock hygiene. He was able to cross B LEs ito figure four position in order to wash and dress LB. He ambulated out to room and dressed seated in w/c, education provided regarding hemi dressing technique, however, required VCs to implement and for correct clothing orientation. He stood with steadying assist to pull up pants requiring assist to fasten 2/2 L UE coordination deficits.  Shaving task completed from w/c level at sink with supervision, utilizing B UEs simulateneously at functional level.  Pt left seated in w/c at end of session, chair belt alarm on and all needs in reach.   Therapy Documentation Precautions:   Precautions Precautions: Fall Restrictions Weight Bearing Restrictions: No Pain:   No/denies pain   Therapy/Group: Individual Therapy  Elizet Kaplan L 06/18/2018, 6:44 AM

## 2018-06-18 NOTE — Progress Notes (Signed)
[facility] ROUNDING NOTE   Subjective:   .Appreciate Dr. Servando Snare placement of stent left cephalic arch 93/01/1695   dialysis performed 06/17/2018 1.9 L removed.  Concerned about condition of left cephalic fistula.  May need to involve VVS will plan dialysis 06/19/2018  Blood pressure 118/66 pulse 96 temperature 97.9 O2 sats 99% room air  Sodium 136 potassium 3.7 chloride 95 CO2 27 BUN 43 creatinine 4.96 glucose 117 phosphorus 3.0 albumin 2.6 WBC 14.5 hemoglobin 7.7 platelets 215   Objective:  Vital signs in last 24 hours:  Temp:  [97.5 F (36.4 C)-99 F (37.2 C)] 97.9 F (36.6 C) (12/13 0624) Pulse Rate:  [80-120] 96 (12/13 0624) Resp:  [16-18] 18 (12/13 0624) BP: (79-124)/(42-92) 118/66 (12/13 0624) SpO2:  [99 %-100 %] 99 % (12/13 0624) Weight:  [63.9 kg-65.7 kg] 64.6 kg (12/13 0410)  Weight change: -1.7 kg Filed Weights   06/17/18 1305 06/17/18 1637 06/18/18 0410  Weight: 65.7 kg 63.9 kg 64.6 kg    Intake/Output: I/O last 3 completed shifts: In: 34 [P.O.:268] Out: 1911 [Other:1911]   Intake/Output this shift:  Total I/O In: 200 [P.O.:200] Out: -   CVS- RRR RS- CTA ABD- BS present soft non-distended EXT-left upper extremity AV fistula tortuous   Basic Metabolic Panel: Recent Labs  Lab 06/11/18 1854  06/13/18 0539 06/14/18 0427 06/15/18 2155 06/16/18 1121 06/16/18 1800  NA 131*   < > 133* 133* 129* 133* 136  K 4.6   < > 5.4* 5.9* 5.0 4.6 3.7  CL 97*   < > 97* 98 96* 95* 95*  CO2 18*   < > 20* 21* 21* 23 27  GLUCOSE 105*   < > 86 90 94 100* 117*  BUN 95*   < > 107* 114* 105* 79* 43*  CREATININE 9.83*   < > 10.47* 10.16* 9.19* 7.48* 4.96*  CALCIUM 9.0   < > 9.5 9.5 9.8 9.6 9.5  PHOS 5.1*  --   --   --  5.1* 4.3 3.0   < > = values in this interval not displayed.    Liver Function Tests: Recent Labs  Lab 06/11/18 1854 06/15/18 2155 06/15/18 2156 06/16/18 1121 06/16/18 1800  ALT  --   --  12  --   --   ALBUMIN 2.6* 2.6*  --   2.6* 2.6*   No results for input(s): LIPASE, AMYLASE in the last 168 hours. No results for input(s): AMMONIA in the last 168 hours.  CBC: Recent Labs  Lab 06/13/18 0539 06/14/18 0427 06/15/18 0540 06/16/18 1121 06/16/18 1845  WBC 8.6 9.5 9.1 13.8* 14.5*  HGB 8.1* 7.7* 7.3* 7.3* 7.7*  HCT 25.7* 23.7* 22.6* 23.4* 24.9*  MCV 86.2 84.6 85.9 86.7 87.4  PLT 129* 146* 209 184 215    Cardiac Enzymes: No results for input(s): CKTOTAL, CKMB, CKMBINDEX, TROPONINI in the last 168 hours.  BNP: Invalid input(s): POCBNP  CBG: No results for input(s): GLUCAP in the last 168 hours.  Microbiology: Results for orders placed or performed during the hospital encounter of 06/08/18  MRSA PCR Screening     Status: None   Collection Time: 06/08/18  9:46 PM  Result Value Ref Range Status   MRSA by PCR NEGATIVE NEGATIVE Final    Comment:        The GeneXpert MRSA Assay (FDA approved for NASAL specimens only), is one component of a comprehensive MRSA colonization surveillance program. It is not intended to diagnose MRSA infection nor to  guide or monitor treatment for MRSA infections. Performed at [facility], [place] [address], [place],  [address]     Coagulation Studies: No results for input(s): LABPROT, INR in the last 72 hours.  Urinalysis: No results for input(s): COLORURINE, LABSPEC, PHURINE, GLUCOSEU, HGBUR, BILIRUBINUR, KETONESUR, PROTEINUR, UROBILINOGEN, NITRITE, LEUKOCYTESUR in the last 72 hours.  Invalid input(s): APPERANCEUR    Imaging: No results found.   Medications:   . sodium chloride     . allopurinol  100 mg Oral Daily  . aspirin  81 mg Oral Daily  . atorvastatin  20 mg Oral q1800  . chlorhexidine  15 mL Mouth Rinse BID  . Chlorhexidine Gluconate Cloth  6 each Topical Q0600  . cholecalciferol  5,000 Units Oral Daily  . [START ON 06/22/2018] darbepoetin (ARANESP) injection - DIALYSIS  100 mcg Intravenous Q Tue-HD  . doxazosin  8 mg Oral Daily   . enoxaparin (LOVENOX) injection  30 mg Subcutaneous Q24H  . gabapentin  300 mg Oral QHS  . multivitamin  1 tablet Oral QHS  . pantoprazole  40 mg Oral Daily  . ticagrelor  90 mg Oral BID   sodium chloride, acetaminophen, aluminum hydroxide, bisacodyl, diphenhydrAMINE, guaiFENesin-dextromethorphan, heparin, lidocaine (PF), lidocaine-prilocaine, pentafluoroprop-tetrafluoroeth, polyethylene glycol, prochlorperazine **OR** prochlorperazine **OR** prochlorperazine, traMADol, traZODone  Assessment/ Plan:   Right MCA infarct due to right ICA and MCA occlusion status post IR rescue stents with reperfusion.  Dilated cardiomyopathy ejection fraction 20 to 25%  End-stage renal disease stage V status post stent to cephalic artery  Hyperkalemia stable received dialysis plan dialysis 25/36/6440  Metabolic bone disease PTH 161 we will start Rocaltrol 0.25 mcg daily  Anemia iron saturations 20% started darbepoetin  History of rectal cancer status post radiation and chemotherapy 2014  History of COPD  Status post revision stent to AV graft 06/14/2018.  He still need evaluation as patient is still having issues with cannulation   LOS: 2 Sherril Croon @TODAY @11 :14 AM

## 2018-06-19 NOTE — Progress Notes (Signed)
[facility] PHYSICAL MEDICINE & REHABILITATION PROGRESS NOTE   Subjective/Complaints: Has urinal in bed. Trying to void and struggling.   ROS: Patient denies fever, rash, sore throat, blurred vision, nausea, vomiting, diarrhea, cough, shortness of breath or chest pain, joint or back pain, headache, or mood change.          Objective:   No results found. Recent Labs    06/16/18 1121 06/16/18 1845  WBC 13.8* 14.5*  HGB 7.3* 7.7*  HCT 23.4* 24.9*  PLT 184 215   Recent Labs    06/16/18 1121 06/16/18 1800  NA 133* 136  K 4.6 3.7  CL 95* 95*  CO2 23 27  GLUCOSE 100* 117*  BUN 79* 43*  CREATININE 7.48* 4.96*  CALCIUM 9.6 9.5    Intake/Output Summary (Last 24 hours) at 06/19/2018 0908 Last data filed at 06/19/2018 0647 Gross per 24 hour  Intake 357 ml  Output 375 ml  Net -18 ml     Physical Exam: Vital Signs Blood pressure 127/69, pulse 98, temperature 98 F (36.7 C), temperature source Oral, resp. rate 16, height 5\' 8"  (1.727 m), weight 64.1 kg, SpO2 100 %.  General: Alert and oriented x 3, No apparent distress HEENT: Head is normocephalic, atraumatic, PERRLA, EOMI, sclera anicteric, oral mucosa pink and moist, dentition intact, ext ear canals clear,  Neck: Supple without JVD or lymphadenopathy Heart: Reg rate and rhythm. No murmurs rubs or gallops Chest: CTA bilaterally without wheezes, rales, or rhonchi; no distress Abdomen: Soft, non-tender, non-distended, bowel sounds positive. Extremities: No clubbing, cyanosis, or edema. Pulses are 2+ Skin: Clean and intact without signs of breakdown, AVG LUE Neuro: Pt is cognitively appropriate with normal insight, memory, and awareness. Cranial nerves 2-12 are intact. Sensory exam is normal. Reflexes are 2+ in all 4's. Fine motor coordination is intact. No tremors. Motor function is 4+/5 prox to distal RUE/RLE, 4/5 LUE and LLE, sensation decreased left foot. .  Musculoskeletal: Full ROM, No pain with AROM or PROM in the  neck, trunk, or extremities. Posture appropriate Psych: Pt's affect is appropriate. Pt is cooperative      Assessment/Plan: 1. Functional deficits secondary to RIght MCA infarct which require 3+ hours per day of interdisciplinary therapy in a comprehensive inpatient rehab setting.  Physiatrist is providing close team supervision and 24 hour management of active medical problems listed below.  Physiatrist and rehab team continue to assess barriers to discharge/monitor patient progress toward functional and medical goals  Care Tool:  Bathing    Body parts bathed by patient: Right arm, Left arm, Chest, Abdomen, Front perineal area, Buttocks, Right upper leg, Left upper leg, Right lower leg, Left lower leg, Face         Bathing assist Assist Level: Minimal Assistance - Patient > 75%     Upper Body Dressing/Undressing Upper body dressing   What is the patient wearing?: Pull over shirt    Upper body assist Assist Level: Moderate Assistance - Patient 50 - 74%    Lower Body Dressing/Undressing Lower body dressing      What is the patient wearing?: Pants     Lower body assist Assist for lower body dressing: Contact Guard/Touching assist     Toileting Toileting    Toileting assist Assist for toileting: Minimal Assistance - Patient > 75%     Transfers Chair/bed transfer  Transfers assist     Chair/bed transfer assist level: Minimal Assistance - Patient > 75%     Locomotion Ambulation  Ambulation assist      Assist level: Minimal Assistance - Patient > 75% Assistive device: Walker-rolling Max distance: 100   Walk 10 feet activity   Assist     Assist level: Minimal Assistance - Patient > 75% Assistive device: Walker-rolling   Walk 50 feet activity   Assist    Assist level: Minimal Assistance - Patient > 75% Assistive device: Walker-rolling    Walk 150 feet activity   Assist    Assist level: Minimal Assistance - Patient > 75% Assistive  device: Walker-rolling    Walk 10 feet on uneven surface  activity   Assist     Assist level: Moderate Assistance - Patient - 50 - 74% Assistive device: Aeronautical engineer Will patient use wheelchair at discharge?: No Type of Wheelchair: Manual    Wheelchair assist level: Minimal Assistance - Patient > 75% Max wheelchair distance: 50    Wheelchair 50 feet with 2 turns activity    Assist        Assist Level: Minimal Assistance - Patient > 75%   Wheelchair 150 feet activity     Assist Wheelchair 150 feet activity did not occur: Safety/medical concerns        Medical Problem List and Plan: 1. Decrease mobility and ADLs secondary to Right MCA infarct   -Continue CIR therapies including PT, OT, and SLP  2. DVT Prophylaxis/Anticoagulation: Pharmaceutical:Lovenox reduced to 30mg  per day 3. Pain Management:Tylenol as needed 4. Mood:LCSW to follow for evaluation and support 5. Neuropsych: This patient is capable of making decisions on his own behalf. 6. Skin/Wound Care:Routine pressure relief measures 7. Fluids/Electrolytes/Nutrition:Monitor I's and O's.only 261ml recorded on 12/12, meal intake 30-100% 8. ESRD stage V: hyperkalemia to be treated with HD M W F .Per Nephro  -voiding as able. Encouraged to be at EOB or toilet when attempting to void 9. Anemia of chronic disease: Started on darbepoetinfor supplementation, Hgb stable mid 7 range 10. HTN: Monitor bid. Continue Cardura.  Vitals:   06/18/18 1931 06/19/18 0645  BP: 136/65 127/69  Pulse: 100 98  Resp: 17 16  Temp: 98.6 F (37 C) 98 F (36.7 C)  SpO2: 100% 100%  Controlled 12/14 11. Neuropathy:   gabapentin at renal dose--once daily. 12. OSA: Continue to use CPAP when napping or when asleep.    LOS: 3 days A FACE TO FACE EVALUATION WAS PERFORMED  Meredith Staggers 06/19/2018, 9:08 AM

## 2018-06-19 NOTE — Progress Notes (Signed)
[facility] ROUNDING NOTE   Subjective:   .Appreciate Dr. Servando Snare placement of stent left cephalic arch 70/09/5007   dialysis performed 06/17/2018 1.9 L removed.  Concerned about condition of left cephalic fistula.  May need to [facility] however we will continue to dialyze patient he is scheduled for dialysis 06/19/2018 but may need to be delayed till 06/20/2018 due to dialysis patient surgery  Blood pressure 127/69 pulse 98 temperature 98 O2 sats 100% room air  Sodium 136 potassium 3.7 chloride 95 CO2 27 BUN 43 creatinine 4.96 CO2 27 calcium 9.5 albumin 2.6 WBC 14.5 hemoglobin 7.7 platelets 215 PTH 161    Objective:  Vital signs in last 24 hours:  Temp:  [97.6 F (36.4 C)-98.6 F (37 C)] 98 F (36.7 C) (12/14 0645) Pulse Rate:  [97-128] 98 (12/14 0645) Resp:  [16-17] 16 (12/14 0645) BP: (81-136)/(53-69) 127/69 (12/14 0645) SpO2:  [96 %-100 %] 100 % (12/14 0645) Weight:  [64.1 kg] 64.1 kg (12/14 0500)  Weight change: -1.6 kg Filed Weights   06/17/18 1637 06/18/18 0410 06/19/18 0500  Weight: 63.9 kg 64.6 kg 64.1 kg    Intake/Output: I/O last 3 completed shifts: In: 557 [P.O.:557] Out: 375 [Urine:375]   Intake/Output this shift:  No intake/output data recorded.  CVS- RRR RS- CTA ABD- BS present soft non-distended EXT-left upper extremity AV fistula tortuous   Basic Metabolic Panel: Recent Labs  Lab 06/13/18 0539 06/14/18 0427 06/15/18 2155 06/16/18 1121 06/16/18 1800  NA 133* 133* 129* 133* 136  K 5.4* 5.9* 5.0 4.6 3.7  CL 97* 98 96* 95* 95*  CO2 20* 21* 21* 23 27  GLUCOSE 86 90 94 100* 117*  BUN 107* 114* 105* 79* 43*  CREATININE 10.47* 10.16* 9.19* 7.48* 4.96*  CALCIUM 9.5 9.5 9.8 9.6 9.5  PHOS  --   --  5.1* 4.3 3.0    Liver Function Tests: Recent Labs  Lab 06/15/18 2155 06/15/18 2156 06/16/18 1121 06/16/18 1800  ALT  --  12  --   --   ALBUMIN 2.6*  --  2.6* 2.6*   No results for input(s): LIPASE, AMYLASE in the last  168 hours. No results for input(s): AMMONIA in the last 168 hours.  CBC: Recent Labs  Lab 06/13/18 0539 06/14/18 0427 06/15/18 0540 06/16/18 1121 06/16/18 1845  WBC 8.6 9.5 9.1 13.8* 14.5*  HGB 8.1* 7.7* 7.3* 7.3* 7.7*  HCT 25.7* 23.7* 22.6* 23.4* 24.9*  MCV 86.2 84.6 85.9 86.7 87.4  PLT 129* 146* 209 184 215    Cardiac Enzymes: No results for input(s): CKTOTAL, CKMB, CKMBINDEX, TROPONINI in the last 168 hours.  BNP: Invalid input(s): POCBNP  CBG: No results for input(s): GLUCAP in the last 168 hours.  Microbiology: Results for orders placed or performed during the hospital encounter of 06/08/18  MRSA PCR Screening     Status: None   Collection Time: 06/08/18  9:46 PM  Result Value Ref Range Status   MRSA by PCR NEGATIVE NEGATIVE Final    Comment:        The GeneXpert MRSA Assay (FDA approved for NASAL specimens only), is one component of a comprehensive MRSA colonization surveillance program. It is not intended to diagnose MRSA infection nor to guide or monitor treatment for MRSA infections. Performed at [facility], [place] [address], [place], [place] [address]     Coagulation Studies: No results for input(s): LABPROT, INR in the last 72 hours.  Urinalysis: No results for input(s): COLORURINE,  LABSPEC, PHURINE, GLUCOSEU, HGBUR, BILIRUBINUR, KETONESUR, PROTEINUR, UROBILINOGEN, NITRITE, LEUKOCYTESUR in the last 72 hours.  Invalid input(s): APPERANCEUR    Imaging: No results found.   Medications:   . sodium chloride     . allopurinol  100 mg Oral Daily  . aspirin  81 mg Oral Daily  . atorvastatin  20 mg Oral q1800  . chlorhexidine  15 mL Mouth Rinse BID  . Chlorhexidine Gluconate Cloth  6 each Topical Q0600  . cholecalciferol  5,000 Units Oral Daily  . [START ON 06/22/2018] darbepoetin (ARANESP) injection - DIALYSIS  100 mcg Intravenous Q Tue-HD  . doxazosin  8 mg Oral Daily  . enoxaparin (LOVENOX) injection  30 mg Subcutaneous Q24H  .  gabapentin  300 mg Oral QHS  . multivitamin  1 tablet Oral QHS  . pantoprazole  40 mg Oral Daily  . ticagrelor  90 mg Oral BID   sodium chloride, acetaminophen, aluminum hydroxide, bisacodyl, diphenhydrAMINE, guaiFENesin-dextromethorphan, heparin, lidocaine (PF), lidocaine-prilocaine, pentafluoroprop-tetrafluoroeth, polyethylene glycol, prochlorperazine **OR** prochlorperazine **OR** prochlorperazine, traMADol, traZODone  Assessment/ Plan:   Right MCA infarct due to right ICA and MCA occlusion status post IR rescue stents with reperfusion.  Currently on rehab  Dilated cardiomyopathy ejection fraction 20 to 25%  End-stage renal disease stage V.  status post stent to cephalic artery.  06/14/2018 appreciate assistance from vascular surgery however may need further work depending on treatment  Hyperkalemia stable received dialysis plan dialysis 36/14/4315  Metabolic bone disease PTH 161 we will start Rocaltrol 0.25 mcg daily  Anemia iron saturations 20% started darbepoetin  History of rectal cancer status post radiation and chemotherapy 2014  History of COPD  Status post revision stent to AV graft 06/14/2018.  He still need evaluation as patient is still having issues with cannulation   LOS: [place] @TODAY @12 :18 PM

## 2018-06-20 ENCOUNTER — Inpatient Hospital Stay (HOSPITAL_COMMUNITY): Payer: Medicare Other | Admitting: Physical Therapy

## 2018-06-20 ENCOUNTER — Inpatient Hospital Stay (HOSPITAL_COMMUNITY): Payer: Medicare Other

## 2018-06-20 LAB — CBC
HCT: 22.6 % — ABNORMAL LOW (ref 39.0–52.0)
Hemoglobin: 7.3 g/dL — ABNORMAL LOW (ref 13.0–17.0)
MCH: 28.7 pg (ref 26.0–34.0)
MCHC: 32.3 g/dL (ref 30.0–36.0)
MCV: 89 fL (ref 80.0–100.0)
Platelets: 260 10*3/uL (ref 150–400)
RBC: 2.54 MIL/uL — ABNORMAL LOW (ref 4.22–5.81)
RDW: 17.4 % — ABNORMAL HIGH (ref 11.5–15.5)
WBC: 10.6 10*3/uL — ABNORMAL HIGH (ref 4.0–10.5)
nRBC: 0 % (ref 0.0–0.2)

## 2018-06-20 LAB — RENAL FUNCTION PANEL
Albumin: 2.6 g/dL — ABNORMAL LOW (ref 3.5–5.0)
Anion gap: 14 (ref 5–15)
BUN: 64 mg/dL — ABNORMAL HIGH (ref 8–23)
CO2: 19 mmol/L — ABNORMAL LOW (ref 22–32)
Calcium: 9.2 mg/dL (ref 8.9–10.3)
Chloride: 97 mmol/L — ABNORMAL LOW (ref 98–111)
Creatinine, Ser: 7.01 mg/dL — ABNORMAL HIGH (ref 0.61–1.24)
GFR calc Af Amer: 8 mL/min — ABNORMAL LOW (ref >60)
GFR calc non Af Amer: 7 mL/min — ABNORMAL LOW (ref >60)
Glucose, Bld: 137 mg/dL — ABNORMAL HIGH (ref 70–99)
Phosphorus: 2.7 mg/dL (ref 2.5–4.6)
Potassium: 3.4 mmol/L — ABNORMAL LOW (ref 3.5–5.1)
Sodium: 130 mmol/L — ABNORMAL LOW (ref 135–145)

## 2018-06-20 MED ORDER — SODIUM CHLORIDE 0.9 % IV SOLN: 100.0000 mL | INTRAVENOUS | Status: DC | PRN

## 2018-06-20 MED ORDER — PENTAFLUOROPROP-TETRAFLUOROETH EX AERO: 1.0000 "application " | INHALATION_SPRAY | CUTANEOUS | Status: DC | PRN

## 2018-06-20 MED ORDER — HEPARIN SODIUM (PORCINE) 1000 UNIT/ML DIALYSIS: 1000.0000 [IU] | INTRAMUSCULAR | Status: DC | PRN

## 2018-06-20 MED ORDER — LIDOCAINE HCL (PF) 1 % IJ SOLN: 5.0000 mL | INTRAMUSCULAR | Status: DC | PRN

## 2018-06-20 MED ORDER — LIDOCAINE-PRILOCAINE 2.5-2.5 % EX CREA: 1.0000 "application " | TOPICAL_CREAM | CUTANEOUS | Status: DC | PRN

## 2018-06-20 MED ORDER — CALCITRIOL 0.25 MCG PO CAPS
0.2500 ug | ORAL_CAPSULE | Freq: Every day | ORAL | Status: DC
  Administered 2018-06-21 – 2018-06-29 (×9): 0.25 ug via ORAL
  Filled 2018-06-20 (×9): qty 1

## 2018-06-20 MED ORDER — ALTEPLASE 2 MG IJ SOLR: 2.0000 mg | Freq: Once | INTRAMUSCULAR | Status: DC | PRN

## 2018-06-20 NOTE — Progress Notes (Signed)
[facility] ROUNDING NOTE   Subjective:   .Appreciate Dr. Servando Snare placement of stent left cephalic arch 24/10/100   dialysis performed 06/17/2018 1.9 L removed.  Concerned about condition of left cephalic fistula.  May need to [facility].  Patient now in dialysis was held over until 06/20/2018 due to dialysis surge usually TTS  Blood pressure 133/71 temperature 98.7 pulse 93 O2 sats 100% room air  Sodium 136 potassium 3.7 chloride 95 CO2 27 BUN 43 creatinine 4.96 glucose 117 calcium 9.5 phosphorus 3.0 albumin 2.6 WBC 14.5 hemoglobin 7.7 platelets 215    Objective:  Vital signs in last 24 hours:  Temp:  [98.4 F (36.9 C)-98.7 F (37.1 C)] 98.7 F (37.1 C) (12/15 0429) Pulse Rate:  [91-95] 93 (12/15 0429) Resp:  [15-18] 15 (12/15 0429) BP: (123-133)/(62-71) 133/71 (12/15 0429) SpO2:  [100 %] 100 % (12/15 0429) Weight:  [62 kg] 62 kg (12/15 0429)  Weight change: -2.1 kg Filed Weights   06/18/18 0410 06/19/18 0500 06/20/18 0429  Weight: 64.6 kg 64.1 kg 62 kg    Intake/Output: I/O last 3 completed shifts: In: 720 [P.O.:720] Out: 485 [Urine:485]   Intake/Output this shift:  No intake/output data recorded.  CVS- RRR RS- CTA ABD- BS present soft non-distended EXT-left upper extremity AV fistula tortuous   Basic Metabolic Panel: Recent Labs  Lab 06/14/18 0427 06/15/18 2155 06/16/18 1121 06/16/18 1800  NA 133* 129* 133* 136  K 5.9* 5.0 4.6 3.7  CL 98 96* 95* 95*  CO2 21* 21* 23 27  GLUCOSE 90 94 100* 117*  BUN 114* 105* 79* 43*  CREATININE 10.16* 9.19* 7.48* 4.96*  CALCIUM 9.5 9.8 9.6 9.5  PHOS  --  5.1* 4.3 3.0    Liver Function Tests: Recent Labs  Lab 06/15/18 2155 06/15/18 2156 06/16/18 1121 06/16/18 1800  ALT  --  12  --   --   ALBUMIN 2.6*  --  2.6* 2.6*   No results for input(s): LIPASE, AMYLASE in the last 168 hours. No results for input(s): AMMONIA in the last 168 hours.  CBC: Recent Labs  Lab 06/14/18 0427  06/15/18 0540 06/16/18 1121 06/16/18 1845  WBC 9.5 9.1 13.8* 14.5*  HGB 7.7* 7.3* 7.3* 7.7*  HCT 23.7* 22.6* 23.4* 24.9*  MCV 84.6 85.9 86.7 87.4  PLT 146* 209 184 215    Cardiac Enzymes: No results for input(s): CKTOTAL, CKMB, CKMBINDEX, TROPONINI in the last 168 hours.  BNP: Invalid input(s): POCBNP  CBG: No results for input(s): GLUCAP in the last 168 hours.  Microbiology: Results for orders placed or performed during the hospital encounter of 06/08/18  MRSA PCR Screening     Status: None   Collection Time: 06/08/18  9:46 PM  Result Value Ref Range Status   MRSA by PCR NEGATIVE NEGATIVE Final    Comment:        The GeneXpert MRSA Assay (FDA approved for NASAL specimens only), is one component of a comprehensive MRSA colonization surveillance program. It is not intended to diagnose MRSA infection nor to guide or monitor treatment for MRSA infections. Performed at [facility], [place] [address], [place], [place] [address]     Coagulation Studies: No results for input(s): LABPROT, INR in the last 72 hours.  Urinalysis: No results for input(s): COLORURINE, LABSPEC, PHURINE, GLUCOSEU, HGBUR, BILIRUBINUR, KETONESUR, PROTEINUR, UROBILINOGEN, NITRITE, LEUKOCYTESUR in the last 72 hours.  Invalid input(s): APPERANCEUR    Imaging: No results found.   Medications:   .  sodium chloride     . allopurinol  100 mg Oral Daily  . aspirin  81 mg Oral Daily  . atorvastatin  20 mg Oral q1800  . chlorhexidine  15 mL Mouth Rinse BID  . Chlorhexidine Gluconate Cloth  6 each Topical Q0600  . cholecalciferol  5,000 Units Oral Daily  . [START ON 06/22/2018] darbepoetin (ARANESP) injection - DIALYSIS  100 mcg Intravenous Q Tue-HD  . doxazosin  8 mg Oral Daily  . enoxaparin (LOVENOX) injection  30 mg Subcutaneous Q24H  . gabapentin  300 mg Oral QHS  . multivitamin  1 tablet Oral QHS  . pantoprazole  40 mg Oral Daily  . ticagrelor  90 mg Oral BID   sodium chloride,  acetaminophen, aluminum hydroxide, bisacodyl, diphenhydrAMINE, guaiFENesin-dextromethorphan, heparin, lidocaine (PF), lidocaine-prilocaine, pentafluoroprop-tetrafluoroeth, polyethylene glycol, prochlorperazine **OR** prochlorperazine **OR** prochlorperazine, traMADol, traZODone  Assessment/ Plan:   Right MCA infarct due to right ICA and MCA occlusion status post IR rescue stents with reperfusion.  Currently on rehab  Dilated cardiomyopathy ejection fraction 20 to 25%  End-stage renal disease stage V.  TTS.  Being dialyzed 06/20/2018 due to dialysis surge status post stent to cephalic artery.  06/14/2018 appreciate assistance from vascular surgery however may need further work depending on treatment  Hyperkalemia resolved  Metabolic bone disease PTH 161 started Rocaltrol 0.25 mcg daily  Anemia iron saturations 20% started darbepoetin 100 mcg schedule 06/22/2018  History of rectal cancer status post radiation and chemotherapy 2014  History of COPD  Status post revision stent to AV graft 06/14/2018.  Appears to have no issues at this time   LOS: [place] @TODAY @11 :49 AM

## 2018-06-20 NOTE — Progress Notes (Signed)
Physical Therapy Session Note  Patient Details  Name: KYLAN LIBERATI Sr. MRN: [id number] Date of Birth: 06-04-1946  Today's Date: 06/20/2018 PT Individual Time: 6168-3729 PT Individual Time Calculation (min): 73 min   Short Term Goals: Week 1:  PT Short Term Goal 1 (Week 1): Pt will perform bed mobility modI PT Short Term Goal 2 (Week 1): Pt will perform transfers with S PT Short Term Goal 3 (Week 1): Pt will ambulate 150' with RW and min guard PT Short Term Goal 4 (Week 1): Pt will perform ascent/descent 12 steps with B handrails and min guard for strengthening and endurance  Skilled Therapeutic Interventions/Progress Updates:  Pt was seen bedside in the am. Pt performed bed mobility with S. Pt performed multiple sit to stand transfers with S and stand pivot transfers with S to c/g. Pt propelled w/c about 50 feet with B UEs and S. Pt ambulated 80 feet x 2 with rolling walker and S to c/g. Pt ambulated 80 feet x 4 with st cane and S to c/g with verbal cues. Pt ambulated 80 feet x 4 and 175 feet with SBQC and S to c/g with verbal cues. Pt performed step taps and alternating step taps 3 sets x 10 reps each for LE strengthening. Pt ascended/decended 4 stairs with 1 rail and SBQC with c/g and verbal cues. Pt returned to room following treatment and left sitting up in bed with bed alarm on and call bell within reach.   Therapy Documentation Precautions:  Precautions Precautions: Fall Restrictions Weight Bearing Restrictions: No General:   Pain: No c/o pain.   Therapy/Group: Individual Therapy  Dub Amis 06/20/2018, 12:05 PM

## 2018-06-20 NOTE — Progress Notes (Signed)
Patient has therapy til 2pm today per HD RN they can get him tonight due to his schedule. HD RN said daily meds can be given.

## 2018-06-20 NOTE — Progress Notes (Signed)
Occupational Therapy Session Note  Patient Details  Name: Jason CAFARELLI Sr. MRN: [id number] Date of Birth: 06/06/1946  Today's Date: 06/20/2018 OT Individual Time: 4327-6147 OT Individual Time Calculation (min): 57 min    Short Term Goals: Week 1:  OT Short Term Goal 1 (Week 1): Pt will stand to compete 2 grooming tasks with supervision in order to increase functional activity tolerance OT Short Term Goal 2 (Week 1): Pt will complete toilet transfer with supervision OT Short Term Goal 3 (Week 1): Pt will independently recall hemi-dressing techniques for UB and LB  Skilled Therapeutic Interventions/Progress Updates:    1:1. No pain reported. Pt received in bed reporting need to toilet and bathe this morning. Pt completes ambulatory transfers throughout session with min A overall fading to CGA with RW and VC for RW management/turns. Pt voids B/B and able to complete 3/3 toileting components with MIN A. Pt bathes sit to stand with CGA for bathing peri area. Pt requires MOD A to don ted stockings, however pt able to don non slip socks. PT threading BLE into pants with VC for hemi technique and noticing/responding to RLE threaded into L pant leg. Pt requires increased time to problem solve. Pt stands to shave at sink with CGA fading to S. Pt dons shirt with S. Exited session with pt seated in reclienr, belt alarm on and call light inreach  Therapy Documentation Precautions:  Precautions Precautions: Fall Restrictions Weight Bearing Restrictions: No General:   Vital Signs:   Pain:  none reported  Therapy/Group: Individual Therapy    Tonny Branch 06/20/2018, 10:14 AM

## 2018-06-20 NOTE — Progress Notes (Signed)
RT offered pt CPAP he refused for the night. RT will continue to monitor.

## 2018-06-20 NOTE — Progress Notes (Signed)
RT offered pt CPAP second night in a row and pt declined. RT will continue to monitor.

## 2018-06-20 NOTE — Progress Notes (Signed)
[facility] PHYSICAL MEDICINE & REHABILITATION PROGRESS NOTE   Subjective/Complaints: Lying in bed. Sleeping soundly when I arrived. No new issues  ROS: Patient denies fever, rash, sore throat, blurred vision, nausea, vomiting, diarrhea, cough, shortness of breath or chest pain, joint or back pain, headache, or mood change.          Objective:   No results found. No results for input(s): WBC, HGB, HCT, PLT in the last 72 hours. No results for input(s): NA, K, CL, CO2, GLUCOSE, BUN, CREATININE, CALCIUM in the last 72 hours.  Intake/Output Summary (Last 24 hours) at 06/20/2018 1508 Last data filed at 06/19/2018 1800 Gross per 24 hour  Intake 240 ml  Output -  Net 240 ml     Physical Exam: Vital Signs Blood pressure (!) 109/51, pulse 89, temperature 98.8 F (37.1 C), resp. rate 19, height 5\' 8"  (1.727 m), weight 62 kg, SpO2 99 %.  Constitutional: No distress . Vital signs reviewed. HEENT: EOMI, oral membranes moist Neck: supple Cardiovascular: RRR without murmur. No JVD    Respiratory: CTA Bilaterally without wheezes or rales. Normal effort    GI: BS +, non-tender, non-distended  Extremities: No clubbing, cyanosis, or edema. Pulses are 2+ Skin: Clean and intact without signs of breakdown, AVG LUE Neuro: Pt is cognitively appropriate with normal insight, memory, and awareness. Cranial nerves 2-12 are intact. Sensory exam is normal. Reflexes are 2+ in all 4's. Fine motor coordination is intact. No tremors. Motor function is 4+/5 prox to distal RUE/RLE, 4/5 LUE and LLE, sensation decreased left foot. .  Musculoskeletal: Full ROM, No pain with AROM or PROM in the neck, trunk, or extremities. Posture appropriate Psych: Pt's affect is appropriate. Pt is cooperative      Assessment/Plan: 1. Functional deficits secondary to RIght MCA infarct which require 3+ hours per day of interdisciplinary therapy in a comprehensive inpatient rehab setting.  Physiatrist is providing close team  supervision and 24 hour management of active medical problems listed below.  Physiatrist and rehab team continue to assess barriers to discharge/monitor patient progress toward functional and medical goals  Care Tool:  Bathing    Body parts bathed by patient: Right arm, Left arm, Chest, Abdomen, Front perineal area, Buttocks, Right upper leg, Left upper leg, Right lower leg, Left lower leg, Face         Bathing assist Assist Level: Minimal Assistance - Patient > 75%     Upper Body Dressing/Undressing Upper body dressing   What is the patient wearing?: Pull over shirt    Upper body assist Assist Level: Moderate Assistance - Patient 50 - 74%    Lower Body Dressing/Undressing Lower body dressing      What is the patient wearing?: Pants     Lower body assist Assist for lower body dressing: Contact Guard/Touching assist     Toileting Toileting    Toileting assist Assist for toileting: Minimal Assistance - Patient > 75%     Transfers Chair/bed transfer  Transfers assist     Chair/bed transfer assist level: Contact Guard/Touching assist     Locomotion Ambulation   Ambulation assist      Assist level: Contact Guard/Touching assist Assistive device: Cane-quad Max distance: 175   Walk 10 feet activity   Assist     Assist level: Contact Guard/Touching assist Assistive device: Cane-quad   Walk 50 feet activity   Assist    Assist level: Contact Guard/Touching assist Assistive device: Cane-quad    Walk 150 feet activity  Assist    Assist level: Contact Guard/Touching assist Assistive device: Cane-quad    Walk 10 feet on uneven surface  activity   Assist     Assist level: Moderate Assistance - Patient - 50 - 74% Assistive device: Aeronautical engineer Will patient use wheelchair at discharge?: No Type of Wheelchair: Manual    Wheelchair assist level: Supervision/Verbal cueing Max wheelchair distance: 50     Wheelchair 50 feet with 2 turns activity    Assist        Assist Level: Supervision/Verbal cueing   Wheelchair 150 feet activity     Assist Wheelchair 150 feet activity did not occur: Safety/medical concerns        Medical Problem List and Plan: 1. Decrease mobility and ADLs secondary to Right MCA infarct   -Continue CIR therapies including PT, OT, and SLP  2. DVT Prophylaxis/Anticoagulation: Pharmaceutical:Lovenox reduced to 30mg  per day 3. Pain Management:Tylenol as needed 4. Mood:LCSW to follow for evaluation and support 5. Neuropsych: This patient is capable of making decisions on his own behalf. 6. Skin/Wound Care:Routine pressure relief measures 7. Fluids/Electrolytes/Nutrition:Monitor I's and O's.only 284ml recorded on 12/12, meal intake 80-100% 8. ESRD stage V: hyperkalemia to be treated with HD M W F .Per Nephro  -  Encouraged to be at EOB or toilet when attempting to void 9. Anemia of chronic disease: Started on darbepoetinfor supplementation, Hgb stable mid 7 range 10. HTN: Monitor bid. Continue Cardura.  Vitals:   06/20/18 0429 06/20/18 1446  BP: 133/71 (!) 109/51  Pulse: 93 89  Resp: 15 19  Temp: 98.7 F (37.1 C) 98.8 F (37.1 C)  SpO2: 100% 99%  Controlled 12/15 11. Neuropathy:   gabapentin at renal dose--once daily. 12. OSA: Continue to use CPAP when napping or when asleep.    LOS: 4 days A FACE TO FACE EVALUATION WAS PERFORMED  Meredith Staggers 06/20/2018, 3:08 PM

## 2018-06-20 NOTE — Progress Notes (Addendum)
Occupational Therapy Session Note  Patient Details  Name: Jason WEIGEL Sr. MRN: [id number] Date of Birth: 1946/06/11  Today's Date: 06/20/2018 OT Individual Time: 1405-1420 OT Individual Time Calculation (min): 15 min   And 40 min 1530-1610   Short Term Goals: Week 1:  OT Short Term Goal 1 (Week 1): Pt will stand to compete 2 grooming tasks with supervision in order to increase functional activity tolerance OT Short Term Goal 2 (Week 1): Pt will complete toilet transfer with supervision OT Short Term Goal 3 (Week 1): Pt will independently recall hemi-dressing techniques for UB and LB  Skilled Therapeutic Interventions/Progress Updates:    Session 1: 1;1. Upon arrival, pt and wife present. Pt asleep and despite multimodal cuing, loud talking, bright lights, sternal rub and cold wash cloth, pt continues to sleep. Wife asked OT to sit pt up. Total A supine>EOB and pt able to hold balance but eyes remained closed. Despite 3-5 min of sitting EOB with S-CGA pt lies back down and agrees to OT returning in an hour after resting.   Session 2: pt remains asleep, however wakes after sternal rub reporting need to toilet. Pt completes all mobility with CGA fading to supervision with quad cane to ambulates to bathroom and tx gym. Pt completes toileting with CGA fading to S for the BM at end of session. In tx gym pt completes BU assessments as follows:  Box and blocks: RUE 25 LUE 20  9HPT RUE 51.2 seconds LUE 1 min 14 seconds.   Exited session with pt seated in bed, wife present in room supervising and all needs met  Therapy Documentation Precautions:  Precautions Precautions: Fall Restrictions Weight Bearing Restrictions: No General:   Vital Signs: Therapy Vitals Temp: 98.8 F (37.1 C) Pulse Rate: 89 Resp: 19 BP: (!) 109/51 Patient Position (if appropriate): Lying Oxygen Therapy SpO2: 99 % O2 Device: Room Air   Therapy/Group: Individual Therapy  Tonny Branch 06/20/2018, 4:19 PM

## 2018-06-21 ENCOUNTER — Inpatient Hospital Stay (HOSPITAL_COMMUNITY): Payer: Medicare Other | Admitting: Physical Therapy

## 2018-06-21 ENCOUNTER — Inpatient Hospital Stay (HOSPITAL_COMMUNITY): Payer: Medicare Other | Admitting: Speech Pathology

## 2018-06-21 ENCOUNTER — Inpatient Hospital Stay (HOSPITAL_COMMUNITY): Payer: Medicare Other | Admitting: Occupational Therapy

## 2018-06-21 DIAGNOSIS — D638 Anemia in other chronic diseases classified elsewhere: Secondary | ICD-10-CM

## 2018-06-21 DIAGNOSIS — R739 Hyperglycemia, unspecified: Secondary | ICD-10-CM

## 2018-06-21 DIAGNOSIS — I10 Essential (primary) hypertension: Secondary | ICD-10-CM

## 2018-06-21 DIAGNOSIS — D72829 Elevated white blood cell count, unspecified: Secondary | ICD-10-CM

## 2018-06-21 MED ORDER — DARBEPOETIN ALFA 200 MCG/0.4ML IJ SOSY
200.0000 ug | PREFILLED_SYRINGE | INTRAMUSCULAR | Status: DC
  Administered 2018-06-22 – 2018-06-28 (×2): 200 ug via INTRAVENOUS
  Filled 2018-06-21 (×2): qty 0.4

## 2018-06-21 MED ORDER — CALCIUM POLYCARBOPHIL 625 MG PO TABS
1250.0000 mg | ORAL_TABLET | Freq: Two times a day (BID) | ORAL | Status: DC
  Administered 2018-06-21 – 2018-06-27 (×12): 1250 mg via ORAL
  Administered 2018-06-27: 625 mg via ORAL
  Administered 2018-06-28 – 2018-06-29 (×3): 1250 mg via ORAL
  Filled 2018-06-21 (×16): qty 2

## 2018-06-21 MED ORDER — CALCIUM POLYCARBOPHIL 625 MG PO TABS
625.0000 mg | ORAL_TABLET | Freq: Every day | ORAL | Status: DC
  Administered 2018-06-21: 625 mg via ORAL
  Filled 2018-06-21: qty 1

## 2018-06-21 MED ORDER — DOXAZOSIN MESYLATE 2 MG PO TABS
2.0000 mg | ORAL_TABLET | Freq: Every day | ORAL | Status: DC
  Administered 2018-06-22: 2 mg via ORAL
  Filled 2018-06-21: qty 1

## 2018-06-21 MED ORDER — CHLORHEXIDINE GLUCONATE CLOTH 2 % EX PADS: 6.0000 | MEDICATED_PAD | Freq: Every day | CUTANEOUS | Status: DC

## 2018-06-21 MED ORDER — SACCHAROMYCES BOULARDII 250 MG PO CAPS
250.0000 mg | ORAL_CAPSULE | Freq: Two times a day (BID) | ORAL | Status: DC
  Administered 2018-06-21 – 2018-06-29 (×17): 250 mg via ORAL
  Filled 2018-06-21 (×17): qty 1

## 2018-06-21 MED ORDER — SODIUM CHLORIDE 0.9 % IV SOLN
250.0000 mg | Freq: Every day | INTRAVENOUS | Status: AC
  Administered 2018-06-21 – 2018-06-23 (×3): 250 mg via INTRAVENOUS
  Filled 2018-06-21 (×5): qty 20

## 2018-06-21 NOTE — Progress Notes (Signed)
Speech Language Pathology Daily Session Note  Patient Details  Name: Jason [name]. MRN: [id number] Date of Birth: 09-23-1945  Today's Date: 06/21/2018 SLP Individual Time: 0730-0830 SLP Individual Time Calculation (min): 60 min  Short Term Goals: Week 1: SLP Short Term Goal 1 (Week 1): Pt will overarticulate, increase vocal intensity, and use slow rate of speech to achieve intelligibility in conversations  SLP Short Term Goal 2 (Week 1): Pt will selectively attend to tasks in a mildly distracting environment for 15 minute intervals with min cues for redirection.  SLP Short Term Goal 3 (Week 1): Pt will complete functional tasks with min assist for functional problem solving.   SLP Short Term Goal 4 (Week 1): Pt will recall daily information with min assist cues for use of external aids.    Skilled Therapeutic Interventions:  Skilled treatment session focused on cognition goals. SLP facilitated session by allowing pt to set-up his breakfast tray in moderately distracting environment. Pt able to demonstrate selective attention and able to increase vocal intensity to articulate wants/needs.  Pt able to participate in simple conversation with good intensity in moderately noisy environment to achieve ~ 95% intelligibility. Pt left upright in bed, bed alarm on and all needs within reach.      Pain Pain Assessment Pain Scale: 0-10 Pain Score: 0-No pain  Therapy/Group: Individual Therapy  Jag Lenz 06/21/2018, 10:24 AM

## 2018-06-21 NOTE — Progress Notes (Signed)
Dr. Posey Pronto notified of patients numerous loose bowel movements.  Patient currently also has fluctuating WBC counts and low grade fevers.  Had a symptomatic episode of hypotension while working with therapy this morning post shower.  Patient is not taking any laxatives at this time.  He reports he is also lactose intolerant and denies any recent consumption of dairy products.  New orders received for probiotic and fiber supplementation.  Will continue to monitor.  Brita Romp, RN

## 2018-06-21 NOTE — Progress Notes (Signed)
[facility] PHYSICAL MEDICINE & REHABILITATION PROGRESS NOTE   Subjective/Complaints: Patient seen sitting up in bed this morning.  He states he slept well overnight.  He states he had a good weekend.  ROS: Denies CP, SOB, N/V/D    Objective:   No results found. Recent Labs    06/20/18 1750  WBC 10.6*  HGB 7.3*  HCT 22.6*  PLT 260   Recent Labs    06/20/18 1751  NA 130*  K 3.4*  CL 97*  CO2 19*  GLUCOSE 137*  BUN 64*  CREATININE 7.01*  CALCIUM 9.2    Intake/Output Summary (Last 24 hours) at 06/21/2018 0929 Last data filed at 06/21/2018 0900 Gross per 24 hour  Intake 720 ml  Output 2001 ml  Net -1281 ml     Physical Exam: Vital Signs Blood pressure (!) 127/56, pulse 95, temperature 99.6 F (37.6 C), temperature source Oral, resp. rate 14, height 5\' 8"  (1.727 m), weight 62.5 kg, SpO2 97 %.  Constitutional: No distress . Vital signs reviewed. HENT: Normocephalic.  Atraumatic. Eyes: EOMI. No discharge. Cardiovascular: RRR. No JVD. Respiratory: CTA Bilaterally. Normal effort. GI: BS +. Non-distended. Musc: No edema or tenderness in extremities. Neuro: Alert  Motor: 5/5 prox to distal RUE/RLE 4+/5 LUE/LLE Skin: Clean and intact without signs of breakdown, AVG LUE Psych: Normal mood.  Normal behavior.  Assessment/Plan: 1. Functional deficits secondary to RIght MCA infarct which require 3+ hours per day of interdisciplinary therapy in a comprehensive inpatient rehab setting.  Physiatrist is providing close team supervision and 24 hour management of active medical problems listed below.  Physiatrist and rehab team continue to assess barriers to discharge/monitor patient progress toward functional and medical goals  Care Tool:  Bathing    Body parts bathed by patient: Right arm, Left arm, Chest, Abdomen, Front perineal area, Buttocks, Right upper leg, Left upper leg, Right lower leg, Left lower leg, Face         Bathing assist Assist Level: Minimal  Assistance - Patient > 75%     Upper Body Dressing/Undressing Upper body dressing   What is the patient wearing?: Pull over shirt    Upper body assist Assist Level: Moderate Assistance - Patient 50 - 74%    Lower Body Dressing/Undressing Lower body dressing      What is the patient wearing?: Pants     Lower body assist Assist for lower body dressing: Contact Guard/Touching assist     Toileting Toileting    Toileting assist Assist for toileting: Minimal Assistance - Patient > 75%     Transfers Chair/bed transfer  Transfers assist     Chair/bed transfer assist level: Contact Guard/Touching assist     Locomotion Ambulation   Ambulation assist      Assist level: Contact Guard/Touching assist Assistive device: Cane-quad Max distance: 175   Walk 10 feet activity   Assist     Assist level: Contact Guard/Touching assist Assistive device: Cane-quad   Walk 50 feet activity   Assist    Assist level: Contact Guard/Touching assist Assistive device: Cane-quad    Walk 150 feet activity   Assist    Assist level: Contact Guard/Touching assist Assistive device: Cane-quad    Walk 10 feet on uneven surface  activity   Assist     Assist level: Moderate Assistance - Patient - 50 - 74% Assistive device: Aeronautical engineer Will patient use wheelchair at discharge?: No Type of Wheelchair: Manual  Wheelchair assist level: Supervision/Verbal cueing Max wheelchair distance: 50    Wheelchair 50 feet with 2 turns activity    Assist        Assist Level: Supervision/Verbal cueing   Wheelchair 150 feet activity     Assist Wheelchair 150 feet activity did not occur: Safety/medical concerns        Medical Problem List and Plan: 1. Decrease mobility and ADLs secondary to Right MCA infarct   -Continue CIR  Notes reviewed-stroke, images reviewed- right MCA infarct, labs reviewed 2. DVT  Prophylaxis/Anticoagulation: Pharmaceutical:Lovenox reduced to 30mg  per day 3. Pain Management:Tylenol as needed 4. Mood:LCSW to follow for evaluation and support 5. Neuropsych: This patient is capable of making decisions on his own behalf. 6. Skin/Wound Care:Routine pressure relief measures 7. Fluids/Electrolytes/Nutrition:Monitor I's and O's 8. ESRD stage V: HD M W F .Per Nephro 9. Anemia of chronic disease: Started on darbepoetinfor supplementation  Hgb 7.3 on 12/15  Recs per nephro 10. HTN: Monitor bid. Continue Cardura.  Vitals:   06/20/18 2146 06/21/18 0458  BP: (!) 109/55 (!) 127/56  Pulse: (!) 113 95  Resp: 17 14  Temp: (!) 97.4 F (36.3 C) 99.6 F (37.6 C)  SpO2: 100% 97%   Controlled 12/16 11. Neuropathy:   gabapentin at renal dose--once daily. 12. OSA: Continue to use CPAP when napping or when asleep. 13.  Hyperglycemia  CBGs elevated on 12/16  Continue to monitor 14.  Leukocytosis  WBCs 12.6 on 12/15  Afebrile  Continue to monitor  LOS: 5 days A FACE TO FACE EVALUATION WAS PERFORMED  Mliss Wedin Lorie Phenix 06/21/2018, 9:29 AM

## 2018-06-21 NOTE — Progress Notes (Signed)
Occupational Therapy Session Note  Patient Details  Name: Jason [name]. MRN: [id number] Date of Birth: 1946/05/24  Today's Date: 06/21/2018 OT Individual Time: 0845-1000 OT Individual Time Calculation (min): 75 min    Short Term Goals: Week 1:  OT Short Term Goal 1 (Week 1): Pt will stand to compete 2 grooming tasks with supervision in order to increase functional activity tolerance OT Short Term Goal 2 (Week 1): Pt will complete toilet transfer with supervision OT Short Term Goal 3 (Week 1): Pt will independently recall hemi-dressing techniques for UB and LB  Skilled Therapeutic Interventions/Progress Updates:    Pt seen for OT ADL bathing/dressing session. Pt sitting up in w/c upon arrival, denying pain and agreeable to tx session. Complaints of stomach ache, RN made aware. He ambulated throughout session with [facility] and steadying assist, completed toileting task with CGA. He transitioned into shower and bathed seated on tub bench, standing with support of grab bars to complete pericare/buttock hygiene. He returned to w/c to dress, supervision cuing to utilize hemi technique which pt was able to independently recall. He stood with steadying assist to pull pants up. TED hose donned total A Pt with complaints of dizziness upon standing to complete grooming tasks, returned to seated position, BP 110/46 in sitting with TED hose donned. Follwing seated rest breaks with symptoms subsided, pt stood for orthostatic BP to be assessed, 66/58 in standing. RN made aware and instructed to return to bed. Pt first wanting to complete toileting task. Stand pivot to toilet with steadying assist, safely completed toileting task and returned to w/c. Pt returned to supine at end of session, positioned for comfort and all needs in reach, bed alarm on and wife present.   Therapy Documentation Precautions:  Precautions Precautions: Fall Restrictions Weight Bearing Restrictions: No Pain:   No/denies  pain   Therapy/Group: Individual Therapy  Lynden Flemmer L 06/21/2018, 7:10 AM

## 2018-06-21 NOTE — Plan of Care (Signed)
  Problem: Consults Goal: RH STROKE PATIENT EDUCATION Description See Patient Education module for education specifics  Outcome: Progressing Goal: Nutrition Consult-if indicated Outcome: Progressing Goal: Diabetes Guidelines if Diabetic/Glucose > 140 Description If diabetic or lab glucose is > 140 mg/dl - Initiate Diabetes/Hyperglycemia Guidelines & Document Interventions  Outcome: Progressing   Problem: RH BOWEL ELIMINATION Goal: RH STG MANAGE BOWEL WITH ASSISTANCE Description STG Manage Bowel with Assistance. Mod I  Outcome: Progressing Goal: RH STG MANAGE BOWEL W/MEDICATION W/ASSISTANCE Description STG Manage Bowel with Medication with Assistance Mod I  Outcome: Progressing   Problem: RH SKIN INTEGRITY Goal: RH STG SKIN FREE OF INFECTION/BREAKDOWN Description Patient free from skin infection entire stay on rehab  Outcome: Progressing Goal: RH STG MAINTAIN SKIN INTEGRITY WITH ASSISTANCE Description STG Maintain Skin Integrity With Assistance.Mod I  Outcome: Progressing   Problem: RH SAFETY Goal: RH STG ADHERE TO SAFETY PRECAUTIONS W/ASSISTANCE/DEVICE Description STG Adhere to Safety Precautions With Assistance/Device. Mod I  Outcome: Progressing Goal: RH STG DECREASED RISK OF FALL WITH ASSISTANCE Description STG Decreased Risk of Fall With Assistance.Mod I  Outcome: Progressing   Problem: RH COGNITION-NURSING Goal: RH STG USES MEMORY AIDS/STRATEGIES W/ASSIST TO PROBLEM SOLVE Description STG Uses Memory Aids/Strategies With Assistance to Problem Solve. Mod  I  Outcome: Progressing Goal: RH STG ANTICIPATES NEEDS/CALLS FOR ASSIST W/ASSIST/CUES Description STG Anticipates Needs/Calls for Assist With Assistance/Cues. Mod I  Outcome: Progressing   Problem: RH PAIN MANAGEMENT Goal: RH STG PAIN MANAGED AT OR BELOW PT'S PAIN GOAL Description Pain less than 2  Outcome: Progressing   Problem: RH KNOWLEDGE DEFICIT Goal: RH STG INCREASE KNOWLEDGE OF  HYPERTENSION Description Increase knowledge  On hypertension mod I  Outcome: Progressing Goal: RH STG INCREASE KNOWLEGDE OF HYPERLIPIDEMIA Description Increase knowledge on hyperlipidemia mod I  Outcome: Progressing Goal: RH STG INCREASE KNOWLEDGE OF STROKE PROPHYLAXIS Description Increase knowledge on stroke prophylaxis mod I  Outcome: Progressing

## 2018-06-21 NOTE — Progress Notes (Signed)
Physical Therapy Session Note  Patient Details  Name: Jason KUNIN Sr. MRN: [id number] Date of Birth: [birth year]  Today's Date: 06/21/2018   Short Term Goals: Week 1:  PT Short Term Goal 1 (Week 1): Pt will perform bed mobility modI PT Short Term Goal 2 (Week 1): Pt will perform transfers with S PT Short Term Goal 3 (Week 1): Pt will ambulate 150' with RW and min guard PT Short Term Goal 4 (Week 1): Pt will perform ascent/descent 12 steps with B handrails and min guard for strengthening and endurance  Skilled Therapeutic Interventions/Progress Updates: Pt received in bed, wife present. Pt hard to arouse, opens eyes and acknowledges therapist but falls back asleep <5 seconds. Per wife pt very fatigued from dialysis yesterday evening. Remained in bed, alarm intact; therapist to rearrange schedule and attempt to see pt later this afternoon. Wife appreciative.  Therapist returned in PM; pt soundly asleep and again awakes minimally to therapist interaction. Remained in bed, alarm intact and all needs in reach. Pt missed 60 min PT d/t fatigue/lethargy.     Therapy Documentation Precautions:  Precautions Precautions: Fall Restrictions Weight Bearing Restrictions: No  General: PT Missed Time: 60 min (fatigue)   Therapy/Group: Individual Therapy  Corliss Skains 06/21/2018, 7:47 AM

## 2018-06-21 NOTE — Progress Notes (Signed)
Subjective:  C/o loose stools today  Objective Vital signs in last 24 hours: Vitals:   06/20/18 2119 06/20/18 2146 06/21/18 0458 06/21/18 1353  BP: (!) 112/59 (!) 109/55 (!) 127/56 (!) 130/59  Pulse: (!) 102 (!) 113 95 97  Resp: 18 17 14 18   Temp: 98.2 F (36.8 C) (!) 97.4 F (36.3 C) 99.6 F (37.6 C) 98.6 F (37 C)  TempSrc: Oral Oral Oral Oral  SpO2: 98% 100% 97% 97%  Weight: 60.6 kg  62.5 kg   Height:       Weight change: 0.6 kg  Intake/Output Summary (Last 24 hours) at 06/21/2018 1937 Last data filed at 06/21/2018 1853 Gross per 24 hour  Intake 480 ml  Output 2001 ml  Net -1521 ml    Assessment/ Plan: Pt is a 72 y.o. yo male with advanced CKD who was admitted on 06/16/2018 with a CVA- dialysis initiated this hospitalization   Assessment/Plan: 1. Renal-initiated dialysis this hospitalization.  Has been performing on a TTS schedule, last treatment on Sunday.  We will plan for next treatment tomorrow, Tuesday.  Clip in process, has not been placed to an outpatient facility as of yet 2.  Access-some difficulty with cannulation.  Dr. Donzetta Matters placed stent on 12/9.  They have been able to run.  Will make sure there is not an issue prior to discharge 3. Anemia-last hemoglobin 7.3.  Iron stores appear low.  On ESA- will inc dose.  Needs to be repleted, not sure if it has been, will order 4. Secondary hyperparathyroidism-no recent PTH, is ordered calcitriol-last phosphorus was low on no binder 5. HTN/volume-blood pressure is good and volume status is reasonable-on Cardura, will decrease dose 6.  Loose stools-has a number of as needed's for constipation but unclear if they have been given.  He has been started on FiberCon-apparently something he takes at home.  I did not do anything additional  [place]: Basic Metabolic Panel: Recent Labs  Lab 06/16/18 1121 06/16/18 1800 06/20/18 1751  NA 133* 136 130*  K 4.6 3.7 3.4*  CL 95* 95* 97*  CO2 23 27 19*   GLUCOSE 100* 117* 137*  BUN 79* 43* 64*  CREATININE 7.48* 4.96* 7.01*  CALCIUM 9.6 9.5 9.2  PHOS 4.3 3.0 2.7   Liver Function Tests: Recent Labs  Lab 06/15/18 2156 06/16/18 1121 06/16/18 1800 06/20/18 1751  ALT 12  --   --   --   ALBUMIN  --  2.6* 2.6* 2.6*   No results for input(s): LIPASE, AMYLASE in the last 168 hours. No results for input(s): AMMONIA in the last 168 hours. CBC: Recent Labs  Lab 06/15/18 0540 06/16/18 1121 06/16/18 1845 06/20/18 1750  WBC 9.1 13.8* 14.5* 10.6*  HGB 7.3* 7.3* 7.7* 7.3*  HCT 22.6* 23.4* 24.9* 22.6*  MCV 85.9 86.7 87.4 89.0  PLT 209 184 215 260   Cardiac Enzymes: No results for input(s): CKTOTAL, CKMB, CKMBINDEX, TROPONINI in the last 168 hours. CBG: No results for input(s): GLUCAP in the last 168 hours.  Iron Studies: No results for input(s): IRON, TIBC, TRANSFERRIN, FERRITIN in the last 72 hours. Studies/Results: No results found. Medications: Infusions: . sodium chloride      Scheduled Medications: . allopurinol  100 mg Oral Daily  . aspirin  81 mg Oral Daily  . atorvastatin  20 mg Oral q1800  . calcitRIOL  0.25 mcg Oral Daily  . chlorhexidine  15 mL Mouth Rinse BID  . Chlorhexidine  Gluconate Cloth  6 each Topical [id number]  . cholecalciferol  5,000 Units Oral Daily  . [START ON 06/22/2018] darbepoetin (ARANESP) injection - DIALYSIS  100 mcg Intravenous Q Tue-HD  . doxazosin  8 mg Oral Daily  . enoxaparin (LOVENOX) injection  30 mg Subcutaneous Q24H  . gabapentin  300 mg Oral QHS  . multivitamin  1 tablet Oral QHS  . pantoprazole  40 mg Oral Daily  . polycarbophil  1,250 mg Oral BID  . saccharomyces boulardii  250 mg Oral BID  . ticagrelor  90 mg Oral BID    have reviewed scheduled and prn medications.  Physical Exam: General: NAD Heart: RRR Lungs: mostly clear Abdomen: soft, non tender Extremities: min edema Dialysis Access: left upper AVF - thrill and bruit     06/21/2018,7:37 PM  LOS: 5 days

## 2018-06-22 ENCOUNTER — Inpatient Hospital Stay (HOSPITAL_COMMUNITY): Payer: Medicare Other | Admitting: Physical Therapy

## 2018-06-22 ENCOUNTER — Inpatient Hospital Stay (HOSPITAL_COMMUNITY): Payer: Medicare Other | Admitting: Occupational Therapy

## 2018-06-22 ENCOUNTER — Inpatient Hospital Stay (HOSPITAL_COMMUNITY): Payer: Medicare Other | Admitting: Speech Pathology

## 2018-06-22 DIAGNOSIS — R339 Retention of urine, unspecified: Secondary | ICD-10-CM

## 2018-06-22 DIAGNOSIS — I10 Essential (primary) hypertension: Secondary | ICD-10-CM

## 2018-06-22 LAB — RENAL FUNCTION PANEL
Albumin: 2.7 g/dL — ABNORMAL LOW (ref 3.5–5.0)
Anion gap: 14 (ref 5–15)
BUN: 47 mg/dL — ABNORMAL HIGH (ref 8–23)
CO2: 23 mmol/L (ref 22–32)
Calcium: 9.7 mg/dL (ref 8.9–10.3)
Chloride: 96 mmol/L — ABNORMAL LOW (ref 98–111)
Creatinine, Ser: 5.94 mg/dL — ABNORMAL HIGH (ref 0.61–1.24)
GFR calc Af Amer: 10 mL/min — ABNORMAL LOW (ref >60)
GFR calc non Af Amer: 9 mL/min — ABNORMAL LOW (ref >60)
Glucose, Bld: 144 mg/dL — ABNORMAL HIGH (ref 70–99)
Phosphorus: 3.5 mg/dL (ref 2.5–4.6)
Potassium: 3.5 mmol/L (ref 3.5–5.1)
Sodium: 133 mmol/L — ABNORMAL LOW (ref 135–145)

## 2018-06-22 LAB — CBC
HCT: 23.1 % — ABNORMAL LOW (ref 39.0–52.0)
HEMOGLOBIN: 7.1 g/dL — AB (ref 13.0–17.0)
MCH: 28.2 pg (ref 26.0–34.0)
MCHC: 30.7 g/dL (ref 30.0–36.0)
MCV: 91.7 fL (ref 80.0–100.0)
Platelets: 257 10*3/uL (ref 150–400)
RBC: 2.52 MIL/uL — ABNORMAL LOW (ref 4.22–5.81)
RDW: 18.3 % — ABNORMAL HIGH (ref 11.5–15.5)
WBC: 13.3 10*3/uL — ABNORMAL HIGH (ref 4.0–10.5)
nRBC: 0 % (ref 0.0–0.2)

## 2018-06-22 MED ORDER — DOXAZOSIN MESYLATE 2 MG PO TABS
2.0000 mg | ORAL_TABLET | Freq: Every day | ORAL | Status: DC
  Administered 2018-06-23 – 2018-06-27 (×3): 2 mg via ORAL
  Filled 2018-06-22 (×5): qty 1

## 2018-06-22 MED ORDER — DOXAZOSIN MESYLATE 4 MG PO TABS: 4.0000 mg | ORAL_TABLET | Freq: Every day | ORAL | Status: DC

## 2018-06-22 MED ORDER — DARBEPOETIN ALFA 200 MCG/0.4ML IJ SOSY
PREFILLED_SYRINGE | INTRAMUSCULAR | Status: AC
  Filled 2018-06-22: qty 0.4

## 2018-06-22 MED ORDER — DOXAZOSIN MESYLATE 2 MG PO TABS
2.0000 mg | ORAL_TABLET | Freq: Once | ORAL | Status: DC
  Filled 2018-06-22: qty 1

## 2018-06-22 NOTE — Progress Notes (Signed)
Physical Therapy Session Note  Patient Details  Name: Jason WEIDA Sr. MRN: [id number] Date of Birth: [birth year]  Today's Date: 06/22/2018 PT Individual Time: 2162-4469 PT Individual Time Calculation (min): 31 min   Short Term Goals: Week 1:  PT Short Term Goal 1 (Week 1): Pt will perform bed mobility modI PT Short Term Goal 2 (Week 1): Pt will perform transfers with S PT Short Term Goal 3 (Week 1): Pt will ambulate 150' with RW and min guard PT Short Term Goal 4 (Week 1): Pt will perform ascent/descent 12 steps with B handrails and min guard for strengthening and endurance  Skilled Therapeutic Interventions/Progress Updates:     Patient received in bed, reporting pain but willing to participate in therapy; requested pain medicine from RN to address pain and applied TEDs totalA for time management, patient able to don/doff socks and don pants with MinA for threading LEs. Completed bed mobility and transfer to [facility] with min guard and RW but poor eccentric control with stand to sit. Able to gait train approximately 125fx2 with RW and min guard, limited by fatigue this morning. He was left up in his WC with seat belt alarm active and all needs otherwise met.   Therapy Documentation Precautions:  Precautions Precautions: Fall Restrictions Weight Bearing Restrictions: No General:   Pain: Pain Assessment Pain Scale: 0-10 Pain Score: 7  Pain Type: Acute pain Pain Location: Other (Comment)(stomach and low back ) Pain Orientation: Anterior Pain Descriptors / Indicators: Aching Pain Frequency: Intermittent Pain Onset: On-going Patients Stated Pain Goal: 0 Pain Intervention(s): RN made aware;Medication (See eMAR) Multiple Pain Sites: No    Therapy/Group: Individual Therapy  KDeniece ReePT, DPT, CBIS  Supplemental Physical Therapist [facility]   Pager [phone] Rehab Office [phone]  06/22/2018, 8:40 AM

## 2018-06-22 NOTE — Progress Notes (Signed)
Patient attempted to void but only dribbles came out.  Bladder scanned for greater than 845 ml.  Cathed for approximately 900 ml.  Patient states he now feels relieved.  Brita Romp, RN

## 2018-06-22 NOTE — Progress Notes (Signed)
Speech Language Pathology Daily Session Note  Patient Details  Name: ABDALLAH HERN Sr. MRN: [id number] Date of Birth: [birth year]  Today's Date: 06/22/2018 SLP Individual Time: 7482-7078 SLP Individual Time Calculation (min): 55 min  Short Term Goals: Week 1: SLP Short Term Goal 1 (Week 1): Pt will overarticulate, increase vocal intensity, and use slow rate of speech to achieve intelligibility in conversations  SLP Short Term Goal 2 (Week 1): Pt will selectively attend to tasks in a mildly distracting environment for 15 minute intervals with min cues for redirection.  SLP Short Term Goal 3 (Week 1): Pt will complete functional tasks with min assist for functional problem solving.   SLP Short Term Goal 4 (Week 1): Pt will recall daily information with min assist cues for use of external aids.    Skilled Therapeutic Interventions:  Pt was seen for skilled ST targeting cognitive goals.  SLP facilitated the session with a novel card game targeting problem solving and attention goals.  Pt was able to selectively attend to task in a mildly distracting environment for >15 minutes with supervision cues for redirection.  He needed mod cues to plan and execute a problem solving strategy within task.  Discussed compensatory strategies for task planning and organization with pt and his wife, who was present towards the end of today's therapy session.  All questions were answered to their satisfaction at this time.  Pt was left sitting in wheelchair with wife at bedside.  Continue per current plan of care.    Pain Pain Assessment Pain Scale: 0-10 Pain Score: 0-No pain   Therapy/Group: Individual Therapy  Kimberla Driskill, Selinda Orion 06/22/2018, 3:58 PM

## 2018-06-22 NOTE — Progress Notes (Signed)
Patient transported to dialysis

## 2018-06-22 NOTE — Progress Notes (Addendum)
[facility] PHYSICAL MEDICINE & REHABILITATION PROGRESS NOTE   Subjective/Complaints: Patient seen laying in bed this morning.  He states he is slept fairly overnight because of some urinary discomfort.  He states that he is not urinated in over 2 days.  Discussed with nursing, patient does void.  ROS: Denies CP, SOB, N/V/D   Objective:   No results found. Recent Labs    06/20/18 1750  WBC 10.6*  HGB 7.3*  HCT 22.6*  PLT 260   Recent Labs    06/20/18 1751  NA 130*  K 3.4*  CL 97*  CO2 19*  GLUCOSE 137*  BUN 64*  CREATININE 7.01*  CALCIUM 9.2    Intake/Output Summary (Last 24 hours) at 06/22/2018 0819 Last data filed at 06/21/2018 1853 Gross per 24 hour  Intake 480 ml  Output -  Net 480 ml     Physical Exam: Vital Signs Blood pressure 140/68, pulse 88, temperature 98 F (36.7 C), temperature source Oral, resp. rate 18, height 5\' 8"  (1.727 m), weight 62.8 kg, SpO2 100 %.  Constitutional: No distress . Vital signs reviewed. HENT: Normocephalic.  Atraumatic. Eyes: EOMI. No discharge. Cardiovascular: RRR.  No JVD. Respiratory: CTA bilaterally.  No effort. GI: BS +. Non-distended. Musc: No edema or tenderness in extremities. Neuro: Alert  Motor: 5/5 prox to distal RUE/RLE, stable 4+/5 LUE/LLE, stable Skin: Clean and intact without signs of breakdown, AVG LUE Psych: Normal mood.  Normal behavior.  Assessment/Plan: 1. Functional deficits secondary to RIght MCA infarct which require 3+ hours per day of interdisciplinary therapy in a comprehensive inpatient rehab setting.  Physiatrist is providing close team supervision and 24 hour management of active medical problems listed below.  Physiatrist and rehab team continue to assess barriers to discharge/monitor patient progress toward functional and medical goals  Care Tool:  Bathing    Body parts bathed by patient: Right arm, Left arm, Chest, Abdomen, Front perineal area, Buttocks, Right upper leg, Left  upper leg, Right lower leg, Left lower leg, Face         Bathing assist Assist Level: Contact Guard/Touching assist     Upper Body Dressing/Undressing Upper body dressing   What is the patient wearing?: Pull over shirt    Upper body assist Assist Level: Supervision/Verbal cueing    Lower Body Dressing/Undressing Lower body dressing      What is the patient wearing?: Pants     Lower body assist Assist for lower body dressing: Supervision/Verbal cueing     Toileting Toileting    Toileting assist Assist for toileting: Moderate Assistance - Patient 50 - 74%     Transfers Chair/bed transfer  Transfers assist     Chair/bed transfer assist level: Moderate Assistance - Patient 50 - 74%     Locomotion Ambulation   Ambulation assist      Assist level: Contact Guard/Touching assist Assistive device: Cane-quad Max distance: 175   Walk 10 feet activity   Assist     Assist level: Contact Guard/Touching assist Assistive device: Cane-quad   Walk 50 feet activity   Assist    Assist level: Contact Guard/Touching assist Assistive device: Cane-quad    Walk 150 feet activity   Assist    Assist level: Contact Guard/Touching assist Assistive device: Cane-quad    Walk 10 feet on uneven surface  activity   Assist     Assist level: Moderate Assistance - Patient - 50 - 74% Assistive device: Aeronautical engineer Will  patient use wheelchair at discharge?: No Type of Wheelchair: Manual    Wheelchair assist level: Supervision/Verbal cueing Max wheelchair distance: 50    Wheelchair 50 feet with 2 turns activity    Assist        Assist Level: Supervision/Verbal cueing   Wheelchair 150 feet activity     Assist Wheelchair 150 feet activity did not occur: Safety/medical concerns        Medical Problem List and Plan: 1. Decrease mobility and ADLs secondary to Right MCA infarct   Continue CIR 2. DVT  Prophylaxis/Anticoagulation: Pharmaceutical:Lovenox reduced to 30mg  per day 3. Pain Management:Tylenol as needed 4. Mood:LCSW to follow for evaluation and support 5. Neuropsych: This patient is capable of making decisions on his own behalf. 6. Skin/Wound Care:Routine pressure relief measures 7. Fluids/Electrolytes/Nutrition:Monitor I's and O's 8. ESRD stage V: HD M W F .Per Nephro 9. Anemia of chronic disease: Started on darbepoetinfor supplementation  Hgb 7.3 on 12/15  Recs per nephro 10. HTN: Monitor bid. Continue Cardura.  Vitals:   06/21/18 2005 06/22/18 0449  BP: 131/65 140/68  Pulse: (!) 102 88  Resp: 18 18  Temp: 98.6 F (37 C) 98 F (36.7 C)  SpO2: 100% 100%   Controlled 12/17 11. Neuropathy:   gabapentin at renal dose--once daily. 12. OSA: Continue to use CPAP when napping or when asleep. 13.  Hyperglycemia  CBGs elevated on 12/16  Continue to monitor 14.  Leukocytosis  WBCs 12.6 on 12/15  Afebrile  Continue to monitor 15.  Urinary retention  Bladder scans ordered LOS: 6 days A FACE TO FACE EVALUATION WAS PERFORMED  Jason [name] Jason [name] 06/22/2018, 8:19 AM

## 2018-06-22 NOTE — Progress Notes (Signed)
Subjective:  Seen on HD - no c/o's  Objective Vital signs in last 24 hours: Vitals:   06/22/18 1330 06/22/18 1400 06/22/18 1430 06/22/18 1500  BP: (!) 110/58 (!) 103/53 (!) 95/53 (!) 111/56  Pulse: 81 92 95 (!) 102  Resp:      Temp:      TempSrc:      SpO2:      Weight:      Height:       Weight change: 0.2 kg  Intake/Output Summary (Last 24 hours) at 06/22/2018 1520 Last data filed at 06/22/2018 1250 Gross per 24 hour  Intake 120 ml  Output 900 ml  Net -780 ml    Assessment/ Plan: Pt is a 72 y.o. yo male with advanced CKD who was admitted on 06/16/2018 with a CVA- dialysis initiated this hospitalization   Assessment/Plan: 1. Renal-initiated dialysis this hospitalization for ESRD.  Has been performing on a TTS schedule, last treatment on Sunday.  We will plan for next treatment Thursday.  Clip in process, has not been placed to an outpatient facility as of yet 2.  Access-some difficulty with cannulation.  Dr. Donzetta Matters placed stent on 12/9.  They have been able to run.  Will make sure there is not an issue prior to discharge 3. Anemia-last hemoglobin 7.3.  Iron stores appear low.  On ESA- will inc dose and replete iron 4. Secondary hyperparathyroidism-no recent PTH, is ordered calcitriol-last phosphorus was low on no binder 5. HTN/volume-blood pressure is good and volume status is reasonable-on Cardura, have decreased dose 6.  Loose stools- he says better   Louis Meckel    Labs: Basic Metabolic Panel: Recent Labs  Lab 06/16/18 1800 06/20/18 1751 06/22/18 1342  NA 136 130* 133*  K 3.7 3.4* 3.5  CL 95* 97* 96*  CO2 27 19* 23  GLUCOSE 117* 137* 144*  BUN 43* 64* 47*  CREATININE 4.96* 7.01* 5.94*  CALCIUM 9.5 9.2 9.7  PHOS 3.0 2.7 3.5   Liver Function Tests: Recent Labs  Lab 06/15/18 2156  06/16/18 1800 06/20/18 1751 06/22/18 1342  ALT 12  --   --   --   --   ALBUMIN  --    < > 2.6* 2.6* 2.7*   < > = values in this interval not displayed.   No results  for input(s): LIPASE, AMYLASE in the last 168 hours. No results for input(s): AMMONIA in the last 168 hours. CBC: Recent Labs  Lab 06/16/18 1121 06/16/18 1845 06/20/18 1750 06/22/18 1342  WBC 13.8* 14.5* 10.6* 13.3*  HGB 7.3* 7.7* 7.3* 7.1*  HCT 23.4* 24.9* 22.6* 23.1*  MCV 86.7 87.4 89.0 91.7  PLT 184 215 260 257   Cardiac Enzymes: No results for input(s): CKTOTAL, CKMB, CKMBINDEX, TROPONINI in the last 168 hours. CBG: No results for input(s): GLUCAP in the last 168 hours.  Iron Studies: No results for input(s): IRON, TIBC, TRANSFERRIN, FERRITIN in the last 72 hours. Studies/Results: No results found. Medications: Infusions: . sodium chloride    . ferric gluconate (FERRLECIT/NULECIT) IV 250 mg (06/21/18 2224)    Scheduled Medications: . allopurinol  100 mg Oral Daily  . aspirin  81 mg Oral Daily  . atorvastatin  20 mg Oral q1800  . calcitRIOL  0.25 mcg Oral Daily  . chlorhexidine  15 mL Mouth Rinse BID  . Chlorhexidine Gluconate Cloth  6 each Topical Q0600  . cholecalciferol  5,000 Units Oral Daily  . darbepoetin (ARANESP) injection - DIALYSIS  200 mcg Intravenous Q Tue-HD  . [START ON 06/23/2018] doxazosin  2 mg Oral Daily  . enoxaparin (LOVENOX) injection  30 mg Subcutaneous Q24H  . gabapentin  300 mg Oral QHS  . multivitamin  1 tablet Oral QHS  . pantoprazole  40 mg Oral Daily  . polycarbophil  1,250 mg Oral BID  . saccharomyces boulardii  250 mg Oral BID  . ticagrelor  90 mg Oral BID    have reviewed scheduled and prn medications.  Physical Exam: General: NAD Heart: RRR Lungs: mostly clear Abdomen: soft, non tender Extremities: min edema Dialysis Access: left upper AVF - thrill and bruit     06/22/2018,3:20 PM  LOS: 6 days

## 2018-06-22 NOTE — Progress Notes (Signed)
Physical Therapy Session Note  Patient Details  Name: Jason SAEPHAN Sr. MRN: [id number] Date of Birth: Feb 21, 1946  Today's Date: 06/22/2018 PT Individual Time: 1100-1155 PT Individual Time Calculation (min): 55 min   Short Term Goals: Week 1:  PT Short Term Goal 1 (Week 1): Pt will perform bed mobility modI PT Short Term Goal 2 (Week 1): Pt will perform transfers with S PT Short Term Goal 3 (Week 1): Pt will ambulate 150' with RW and min guard PT Short Term Goal 4 (Week 1): Pt will perform ascent/descent 12 steps with B handrails and min guard for strengthening and endurance  Skilled Therapeutic Interventions/Progress Updates:    Patient received up in [facility], pleasant and willing to participate with therapy but reporting increased fatigue this morning. Able to ambulate approximately 46f with RW and S-min guard before fatiguing, then [facility] with RW and S-Min guard. Able to ride Nustep for approximately 8 minutes with B LEs/UEs on level 3 in intervals of 1-2 minutes with cues throughout to stop and rest for fatigue management, then transferred to chair with S-min guard and RW. Practiced stairs (ascend 8 small steps and descended 4 large steps) with B railings and min guard, cues for safety. Then [facility] where he was able to use restroom with S but needed totalA for pericare. Returned to bed with min guard and finished session with additional exercises for glutes, hip flexors,and hip abductors, continued education regarding energy management in face of dialysis. He was left in bed with all needs met, bed alarm active, and wife present.   Therapy Documentation Precautions:  Precautions Precautions: Fall Restrictions Weight Bearing Restrictions: No General:   Vital Signs:   Pain: Pain Assessment Pain Scale: 0-10 Pain Score: 2  Pain Type: Acute pain Pain Location: Other (Comment)(stomach and  back ) Increased activity/repositioned No additional pain locations/no multiple pain locations    Therapy/Group: Individual Therapy  KDeniece ReePT, DPT, CBIS  [facility]   Pager [phone] Rehab Office [phone]  06/22/2018, 12:40 PM

## 2018-06-22 NOTE — Progress Notes (Signed)
Occupational Therapy Session Note  Patient Details  Name: Jason [name]. MRN: [id number] Date of Birth: February 02, 1946  Today's Date: 06/22/2018 OT Individual Time: 2836-6294 OT Individual Time Calculation (min): 60 min    Short Term Goals: Week 1:  OT Short Term Goal 1 (Week 1): Pt will stand to compete 2 grooming tasks with supervision in order to increase functional activity tolerance OT Short Term Goal 2 (Week 1): Pt will complete toilet transfer with supervision OT Short Term Goal 3 (Week 1): Pt will independently recall hemi-dressing techniques for UB and LB  Skilled Therapeutic Interventions/Progress Updates:    Pt seen for OT session focusing on neuro re-ed with L UE/LE. Pt sitting up in w/c upon arrival, denied pain and agreeable to tx session. He declined need for bathing/dressing routine this morning. He ambulated throughout session with RW and close supervision. Multimodal cuing provided throughout for erect posture and symmetrical stance. Required one seated rest break in ADL apartment, able to complete sit>stand from low soft surface couch with supervision. In therapy gym, completed standing and table top activities focusing on in-hand manipulation skills, grip strengthening and proprioceptive abilities. Pt required mod cuing for cognitive aspect of fine motor pattern replication activity.  From standing position, pt's R LE positioned on step in order to facilitate weightbearing through L LE.  Pt requesting toileting task, ambulated to public restroom and completed toilteing task with guarding assist. Pt unaware of saturated brief. Pt with reports of dizziness upon standing to complete hand hygiene.  Pt returned to room in w/c, BP 101/51 seated in w/c (TED hose donned during entirety of session). RN made aware. Pt left seated in w/c with all needs in reach awaiting hand off to SLP.   Therapy Documentation Precautions:  Precautions Precautions: Fall Restrictions Weight Bearing  Restrictions: No   Therapy/Group: Individual Therapy  Jessly Lebeck L 06/22/2018, 6:57 AM

## 2018-06-22 NOTE — Procedures (Signed)
Patient was seen on dialysis and the procedure was supervised.  BFR 300  Via AVG BP is  111/56.   Patient appears to be tolerating treatment well  Louis Meckel 06/22/2018

## 2018-06-23 ENCOUNTER — Inpatient Hospital Stay (HOSPITAL_COMMUNITY): Payer: Medicare Other | Admitting: Speech Pathology

## 2018-06-23 ENCOUNTER — Inpatient Hospital Stay (HOSPITAL_COMMUNITY): Payer: Medicare Other | Admitting: Occupational Therapy

## 2018-06-23 ENCOUNTER — Inpatient Hospital Stay (HOSPITAL_COMMUNITY): Payer: Medicare Other | Admitting: Physical Therapy

## 2018-06-23 ENCOUNTER — Inpatient Hospital Stay (HOSPITAL_COMMUNITY): Payer: Medicare Other

## 2018-06-23 LAB — PARATHYROID HORMONE, INTACT (NO CA): PTH: 164 pg/mL — ABNORMAL HIGH (ref 15–65)

## 2018-06-23 MED ORDER — CHLORHEXIDINE GLUCONATE CLOTH 2 % EX PADS: 6.0000 | MEDICATED_PAD | Freq: Every day | CUTANEOUS | Status: DC

## 2018-06-23 NOTE — Progress Notes (Signed)
Subjective:   Patient had HD yesterday and tolerated well.  He had 1.5 kg UF with initial goal of 2.5 kg.  Spoke with dialysis RN and cannulation going better  ROS: + sometimes dizzy; denies shortness of breath or chest pain; denies nausea or vomiting    Objective Vital signs in last 24 hours: Vitals:   06/22/18 1724 06/22/18 1956 06/22/18 1956 06/23/18 0517  BP: 119/60 (!) 133/50 (!) 133/50 110/61  Pulse: 92 85 86 92  Resp: 16 18 18 15   Temp: 98.4 F (36.9 C) 98.6 F (37 C) 98.6 F (37 C) 98.7 F (37.1 C)  TempSrc: Oral Oral Oral Oral  SpO2: 100% 100% 100% 100%  Weight: 60.3 kg   60.7 kg  Height:       Weight change: -0.7 kg  Intake/Output Summary (Last 24 hours) at 06/23/2018 1032 Last data filed at 06/23/2018 5277 Gross per 24 hour  Intake 360 ml  Output 2800 ml  Net -2440 ml    Assessment/ Plan: Pt is a 72 y.o. yo male with advanced CKD who was admitted on 06/16/2018 with a CVA- dialysis initiated this hospitalization    Assessment/Plan: 1. ESRD - he has initiated HD this hospitalization.  HD per TTS schedule with next tx 12/19.  Clip in process; still awaiting outpatient unit.  Will decrease his goal UF with the next treatment/raise dry weight given dizziness 2.  Access-some difficulty with cannulation.  Dr. Donzetta Matters placed stent on 12/9.  Ran well yesterday.  Working identify issues prior to discharge 3. Anemia-last hemoglobin 7.1.  Iron stores low.  On ESA (s/p recent dose increase to 200 mcg weekly) and now on iron repletion.  Assess for need for PRBC's 4. Secondary hyperparathyroidism- PTH 164.  On daily calcitriol.  Phos acceptable without a binder  5. HTN/volume- controlled; he is on decreased dose of cardura.   6.  Loose stools- improved  Claudia Desanctis    Labs: Basic Metabolic Panel: Recent Labs  Lab 06/16/18 1800 06/20/18 1751 06/22/18 1342  NA 136 130* 133*  K 3.7 3.4* 3.5  CL 95* 97* 96*  CO2 27 19* 23  GLUCOSE 117* 137* 144*  BUN 43* 64* 47*   CREATININE 4.96* 7.01* 5.94*  CALCIUM 9.5 9.2 9.7  PHOS 3.0 2.7 3.5   Liver Function Tests: Recent Labs  Lab 06/16/18 1800 06/20/18 1751 06/22/18 1342  ALBUMIN 2.6* 2.6* 2.7*   No results for input(s): LIPASE, AMYLASE in the last 168 hours. No results for input(s): AMMONIA in the last 168 hours. CBC: Recent Labs  Lab 06/16/18 1121 06/16/18 1845 06/20/18 1750 06/22/18 1342  WBC 13.8* 14.5* 10.6* 13.3*  HGB 7.3* 7.7* 7.3* 7.1*  HCT 23.4* 24.9* 22.6* 23.1*  MCV 86.7 87.4 89.0 91.7  PLT 184 215 260 257   Cardiac Enzymes: No results for input(s): CKTOTAL, CKMB, CKMBINDEX, TROPONINI in the last 168 hours. CBG: No results for input(s): GLUCAP in the last 168 hours.  Iron Studies: No results for input(s): IRON, TIBC, TRANSFERRIN, FERRITIN in the last 72 hours. Studies/Results: No results found. Medications: Infusions: . sodium chloride      Scheduled Medications: . allopurinol  100 mg Oral Daily  . aspirin  81 mg Oral Daily  . atorvastatin  20 mg Oral q1800  . calcitRIOL  0.25 mcg Oral Daily  . chlorhexidine  15 mL Mouth Rinse BID  . Chlorhexidine Gluconate Cloth  6 each Topical Q0600  . cholecalciferol  5,000 Units Oral Daily  .  darbepoetin (ARANESP) injection - DIALYSIS  200 mcg Intravenous Q Tue-HD  . doxazosin  2 mg Oral Daily  . enoxaparin (LOVENOX) injection  30 mg Subcutaneous Q24H  . gabapentin  300 mg Oral QHS  . multivitamin  1 tablet Oral QHS  . pantoprazole  40 mg Oral Daily  . polycarbophil  1,250 mg Oral BID  . saccharomyces boulardii  250 mg Oral BID  . ticagrelor  90 mg Oral BID    have reviewed scheduled and prn medications.  Physical Exam: General: NAD Heart: RRR; no rubs Lungs: clear to auscultation and unlabored Abdomen: soft, non tender/NT Extremities: no lower extremity edema Dialysis Access: left upper AVF - thrill and bruit    06/23/2018,10:32 AM  LOS: 7 days

## 2018-06-23 NOTE — Progress Notes (Signed)
[place] PHYSICAL MEDICINE & REHABILITATION PROGRESS NOTE   Subjective/Complaints: Patient seen laying in bed this morning.  He states he slept well overnight.  Discussed with nursing regarding urinary retention.  ROS: Denies CP, SOB, N/V/D   Objective:   No results found. Recent Labs    06/20/18 1750 06/22/18 1342  WBC 10.6* 13.3*  HGB 7.3* 7.1*  HCT 22.6* 23.1*  PLT 260 257   Recent Labs    06/20/18 1751 06/22/18 1342  NA 130* 133*  K 3.4* 3.5  CL 97* 96*  CO2 19* 23  GLUCOSE 137* 144*  BUN 64* 47*  CREATININE 7.01* 5.94*  CALCIUM 9.2 9.7    Intake/Output Summary (Last 24 hours) at 06/23/2018 0834 Last data filed at 06/23/2018 0530 Gross per 24 hour  Intake 120 ml  Output 2800 ml  Net -2680 ml     Physical Exam: Vital Signs Blood pressure 110/61, pulse 92, temperature 98.7 F (37.1 C), temperature source Oral, resp. rate 15, height 5\' 8"  (1.727 m), weight 60.7 kg, SpO2 100 %.  Constitutional: No distress . Vital signs reviewed. HENT: Normocephalic.  Atraumatic. Eyes: EOMI. No discharge. Cardiovascular: RRR.  No JVD. Respiratory: CTA bilaterally.  Normal effort. GI: BS +. Non-distended. Musc: No edema or tenderness in extremities. Neuro: Alert  Motor: 5/5 prox to distal RUE/RLE, unchanged 4+/5 LUE/LLE, unchanged Skin: Clean and intact without signs of breakdown, AVG LUE Psych: Normal mood.  Normal behavior.  Assessment/Plan: 1. Functional deficits secondary to RIght MCA infarct which require 3+ hours per day of interdisciplinary therapy in a comprehensive inpatient rehab setting.  Physiatrist is providing close team supervision and 24 hour management of active medical problems listed below.  Physiatrist and rehab team continue to assess barriers to discharge/monitor patient progress toward functional and medical goals  Care Tool:  Bathing    Body parts bathed by patient: Right arm, Left arm, Chest, Abdomen, Front perineal area, Buttocks,  Right upper leg, Left upper leg, Right lower leg, Left lower leg, Face         Bathing assist Assist Level: Contact Guard/Touching assist     Upper Body Dressing/Undressing Upper body dressing   What is the patient wearing?: Pull over shirt    Upper body assist Assist Level: Supervision/Verbal cueing    Lower Body Dressing/Undressing Lower body dressing      What is the patient wearing?: Pants     Lower body assist Assist for lower body dressing: Supervision/Verbal cueing     Toileting Toileting    Toileting assist Assist for toileting: Moderate Assistance - Patient 50 - 74%     Transfers Chair/bed transfer  Transfers assist     Chair/bed transfer assist level: Contact Guard/Touching assist     Locomotion Ambulation   Ambulation assist      Assist level: Contact Guard/Touching assist Assistive device: Walker-rolling Max distance: 125ft   Walk 10 feet activity   Assist     Assist level: Contact Guard/Touching assist Assistive device: Walker-rolling   Walk 50 feet activity   Assist    Assist level: Contact Guard/Touching assist Assistive device: Walker-rolling    Walk 150 feet activity   Assist    Assist level: Contact Guard/Touching assist Assistive device: Walker-rolling    Walk 10 feet on uneven surface  activity   Assist     Assist level: Moderate Assistance - Patient - 50 - 74% Assistive device: Aeronautical engineer Will patient use wheelchair at discharge?:  No Type of Wheelchair: Manual    Wheelchair assist level: Supervision/Verbal cueing Max wheelchair distance: 50    Wheelchair 50 feet with 2 turns activity    Assist        Assist Level: Supervision/Verbal cueing   Wheelchair 150 feet activity     Assist Wheelchair 150 feet activity did not occur: Safety/medical concerns        Medical Problem List and Plan: 1. Decrease mobility and ADLs secondary to Right MCA  infarct   Continue CIR 2. DVT Prophylaxis/Anticoagulation: Pharmaceutical:Lovenox reduced to 30mg  per day 3. Pain Management:Tylenol as needed 4. Mood:LCSW to follow for evaluation and support 5. Neuropsych: This patient is capable of making decisions on his own behalf. 6. Skin/Wound Care:Routine pressure relief measures 7. Fluids/Electrolytes/Nutrition:Monitor I's and O's  Variable nutritional intake on 12/18 8. ESRD stage V: HD M W F .Per Nephro 9. Anemia of chronic disease: Started on darbepoetinfor supplementation  Hgb 7.1 on 12/17  Recs per nephro 10. HTN: Monitor bid. Continue Cardura.  Vitals:   06/22/18 1956 06/23/18 0517  BP: (!) 133/50 110/61  Pulse: 86 92  Resp: 18 15  Temp: 98.6 F (37 C) 98.7 F (37.1 C)  SpO2: 100% 100%   Controlled 12/17 11. Neuropathy:   gabapentin at renal dose--once daily. 12. OSA: Continue to use CPAP when napping or when asleep. 13.  Hyperglycemia  CBGs elevated on 12/17  Continue to monitor 14.  Leukocytosis  WBCs 13.3 on 12/17  Afebrile  Continue to monitor 15.  Urinary retention  PVR showing retention  I/O caths LOS: 7 days A FACE TO FACE EVALUATION WAS PERFORMED  Yusuf Yu Lorie Phenix 06/23/2018, 8:34 AM

## 2018-06-23 NOTE — Progress Notes (Signed)
Occupational Therapy Session Note  Patient Details  Name: Jason HAGAN Sr. MRN: [id number] Date of Birth: 1946-01-19  Today's Date: 06/23/2018 OT Individual Time: 0845-1000 OT Individual Time Calculation (min): 75 min    Short Term Goals: Week 1:  OT Short Term Goal 1 (Week 1): Pt will stand to compete 2 grooming tasks with supervision in order to increase functional activity tolerance OT Short Term Goal 2 (Week 1): Pt will complete toilet transfer with supervision OT Short Term Goal 3 (Week 1): Pt will independently recall hemi-dressing techniques for UB and LB  Skilled Therapeutic Interventions/Progress Updates:    Pt seen for OT ADL Bathing/dressing session. Pt in supine upon arrival, denying pain and agreeable to tx sessoin and showering task. IV stie and dialysis site covered prior to shower. He ambulated throughout room with RW and close supervision, min cuing for RW management in funcitonal context. He completed toileting task with guarding assist. Transitioned to shower and bathed with supervision seated on tub transfer bench, standing with support of grab bars to complete pericare/buttock hygine.  He ambulated back to w/c and dressed seated in w/c with supervision overall with cuing for safety to use RW during standing portions.  He completed grooming tasks standing at sink with supervision, tolerating ~4 of standing before requiring seated rest break.  Pt taken to ADL apartment in w/c total A for time and energy conservation. He completed simulated tub shower transfer utilizing tub transfer bench. Completed with close supervision following demonstration for technique and VCs. Pt believes this will work in home setting. Completed sit>stand from low soft recliner with supervision.  In therapy gym, he completed towel folding activity standing on foam wedge using B UEs simultaneously. Required min A for standing balance.  Pt left seated EOM at end of session awaiting hand off to PT.    Throughout session, pt required min cuing for sequencing and safety awareness, attempting to stand and exit shower with water stull running and having not dried off as well as standing to pull pants up without RW in front.   Therapy Documentation Precautions:  Precautions Precautions: Fall Restrictions Weight Bearing Restrictions: No Pain:   No/denies pain   Therapy/Group: Individual Therapy  Danaija Eskridge L 06/23/2018, 7:11 AM

## 2018-06-23 NOTE — Patient Care Conference (Cosign Needed)
Inpatient RehabilitationTeam Conference and Plan of Care Update Date: 06/23/2018   Time: 2:20 PM    Patient Name: Jason AHART Sr.      Medical Record Number: [id number]  Date of Birth: May 16, 1946 Sex: Male         Room/Bed: 4M05C/4M05C-01 Payor Info: Payor: Marine scientist / Plan: [employer] MEDICARE / Product Type: *No Product type* /    Admitting Diagnosis: CVA  Admit Date/Time:  06/16/2018  5:53 PM Admission Comments: No comment available   Primary Diagnosis:  <principal problem not specified> Principal Problem: <principal problem not specified>  Patient Active Problem List   Diagnosis Date Noted  . Urinary retention   . Benign essential HTN   . Leukocytosis   . Hyperglycemia   . Anemia of chronic disease   . ESRD on dialysis ([place]) 06/16/2018  . Mild malnutrition ([place]) 06/16/2018  . Stroke due to embolism ([place]) 06/16/2018  . Acute respiratory insufficiency   . Chronic systolic (congestive) heart failure ([place])   . Acute ischemic stroke ([place]) 06/08/2018  . Middle cerebral artery embolism, right s/p Mechanical Thrombectomy 06/08/2018  . CHF (congestive heart failure) ([place]) 05/08/2018  . Normocytic anemia 05/08/2018  . Hyperlipidemia LDL goal <70 09/23/2017  . OAB (overactive bladder) 01/09/2016  . SUI (stress urinary incontinence), male 01/09/2016  . Hypersomnia 10/30/2015  . Obesity (BMI 30.0-34.9) 10/30/2015  . Routine general medical examination at a health care facility 06/04/2015  . Medicare annual wellness visit, subsequent 06/04/2015  . Hereditary and idiopathic peripheral neuropathy 09/14/2014  . Rectal cancer ([place]) 02/01/2013  . Chronic renal insufficiency 07/31/2011  . Obstructive sleep apnea 07/29/2010  . GOUT 05/11/2007  . ERECTILE DYSFUNCTION, MILD 05/11/2007  . Essential hypertension 05/11/2007    Expected Discharge Date: Expected Discharge Date: 06/29/18  Team Members Present: Physician leading conference: Dr. Delice Lesch Social Worker Present:  Ovidio Kin, LCSW Nurse Present: Rayetta Pigg, RN PT Present: Kennyth Lose, PT OT Present: Amy Rounds, OT SLP Present: Windell Moulding, SLP PPS Coordinator present : Daiva Nakayama, RN, CRRN;Melissa Gertie Fey     Current Status/Progress Goal Weekly Team Focus  Medical   Decrease mobility and ADLs secondary to Right MCA infarct  Improve mobility, safety, cognition, urinary retention, leukocytosis, new ESRD  See above   Bowel/Bladder   Patient currently having trouble empting bladder; PVR q8hr and in/out cath with scan >34mL; LBM 12/17  Patient empting bladder without assistance of in/out cath.  Assist with tolieting as needed.   Swallow/Nutrition/ Hydration             ADL's   Min A overall for bathing/dressing. CGA functional transfers and toileting tasks  Supervision overall  ADL re-training, L neuro re-ed, family ed, d/c planning   Mobility   S bed mobility, min guard transfers/gait/stairs  modI bed mobility and transfers, S gait and stairs for strengthening  dynamic balance, L NMR, activity tolerance, gait/stair training   Communication   min assist   mod I   education and carryover of intelligibility strategies    Safety/Cognition/ Behavioral Observations  mod assist   supervision  mildly complex cognition, specifically attention, problem solving, and memory    Pain   Patient pain treated with PRN tylenol.  Keep patient pain level controlled below a 2.  Assess and treat pain qshift and as needed.   Skin   No new changes to skin,  Maintain skin integrity  Assess qshift and as needed.      *See Care Plan and progress  notes for long and short-term goals.     Barriers to Discharge  Current Status/Progress Possible Resolutions Date Resolved   Physician    Medical stability;Hemodialysis     See above  Therapies, I/O caths, follow labs, Nephro recs      Nursing  Incontinence               PT                    OT                  SLP                SW                 Discharge Planning/Teaching Needs:  Home with wife who can assist him at discharge and transport to HD. HD working on getting an OP spot for him at [facility] form hospital      Team Discussion:  Goals of supervision level due to cognitive issues and safety issues. Working on activity tolerance and fatigue levels. Receiving HD daily at times still trying to adjust his schedule. MD checking labs and watching BP. Nursing having to I & O cath due to retaining urine. Wife here daily will work on Dc needs.  Revisions to Treatment Plan:  DC 12/24    Continued Need for Acute Rehabilitation Level of Care: The patient requires daily medical management by a physician with specialized training in physical medicine and rehabilitation for the following conditions: Daily direction of a multidisciplinary physical rehabilitation program to ensure safe treatment while eliciting the highest outcome that is of practical value to the patient.: Yes Daily medical management of patient stability for increased activity during participation in an intensive rehabilitation regime.: Yes Daily analysis of laboratory values and/or radiology reports with any subsequent need for medication adjustment of medical intervention for : Blood pressure problems;Mood/behavior problems;Neurological problems;Other   I attest that I was present, lead the team conference, and concur with the assessment and plan of the team.   Elease Hashimoto 06/23/2018, 3:16 PM

## 2018-06-23 NOTE — Progress Notes (Signed)
Occupational Therapy Session Note  Patient Details  Name: Jason REINIG Sr. MRN: [id number] Date of Birth: [birth year]  Today's Date: 06/23/2018 OT Individual Time: 1400-1430 OT Individual Time Calculation (min): 30 min    Short Term Goals: Week 1:  OT Short Term Goal 1 (Week 1): Pt will stand to compete 2 grooming tasks with supervision in order to increase functional activity tolerance OT Short Term Goal 2 (Week 1): Pt will complete toilet transfer with supervision OT Short Term Goal 3 (Week 1): Pt will independently recall hemi-dressing techniques for UB and LB  Skilled Therapeutic Interventions/Progress Updates:    Pt asleep in bed upon arrival but easily aroused.  OT intervention with focus on bed mobility, sit<>stand, standing balance, functional amb with RW, LUE use, activity tolerance, and safety awareness to increase independence with BADLs.  Pt amb with RW in room and hallway with CGA.  Pt engaged in standing tasks reaching with LUE for cards on card board.  Pt completed tasks with CGA.  Pt required extra time to locate cards in L visual field.  Pt returned to room and remained in w/c with all needs within reach.   Therapy Documentation Precautions:  Precautions Precautions: Fall Restrictions Weight Bearing Restrictions: No   Pain: Pt denies pain  Therapy/Group: Individual Therapy  Leroy Libman 06/23/2018, 2:47 PM

## 2018-06-23 NOTE — Progress Notes (Signed)
Speech Language Pathology Daily Session Note  Patient Details  Name: Jason KNOTH Sr. MRN: [id number] Date of Birth: 22-Aug-1945  Today's Date: 06/23/2018 SLP Individual Time: 1515-1540 SLP Individual Time Calculation (min): 25 min  Short Term Goals: Week 1: SLP Short Term Goal 1 (Week 1): Pt will overarticulate, increase vocal intensity, and use slow rate of speech to achieve intelligibility in conversations  SLP Short Term Goal 2 (Week 1): Pt will selectively attend to tasks in a mildly distracting environment for 15 minute intervals with min cues for redirection.  SLP Short Term Goal 3 (Week 1): Pt will complete functional tasks with min assist for functional problem solving.   SLP Short Term Goal 4 (Week 1): Pt will recall daily information with min assist cues for use of external aids.    Skilled Therapeutic Interventions:  Pt was seen for skilled ST targeting cognitive goals.  SLP facilitated the session with medication management tasks to address goals for daily recall.  Pt needed max cues to recall function and frequency of medications when named as most medications are new to him from prior to admission.  Would recommend practicing using a pill box at next available appointment as this is what pt uses at home.  Pt was left in bed with bed alarm set and call bell within reach.  Continue per current plan of care.    Pain Pain Assessment Pain Scale: 0-10 Pain Score: 3  Faces Pain Scale: Hurts little more Pain Type: Acute pain  Therapy/Group: Individual Therapy  Angeliah Wisdom, Selinda Orion 06/23/2018, 4:02 PM

## 2018-06-23 NOTE — Progress Notes (Signed)
Physical Therapy Session Note  Patient Details  Name: Jason CUMMISKEY Sr. MRN: [id number] Date of Birth: September 27, 1945  Today's Date: 06/23/2018 PT Concurrent Time: 1000-1100 PT Concurrent Time Calculation (min): 60 min  Short Term Goals: Week 1:  PT Short Term Goal 1 (Week 1): Pt will perform bed mobility modI PT Short Term Goal 2 (Week 1): Pt will perform transfers with S PT Short Term Goal 3 (Week 1): Pt will ambulate 150' with RW and min guard PT Short Term Goal 4 (Week 1): Pt will perform ascent/descent 12 steps with B handrails and min guard for strengthening and endurance  Skilled Therapeutic Interventions/Progress Updates: Pt received seated on mat table in gym with handoff from previous OT; denies pain and agreeable to treatment. Gait x150' with RW and S. Ascent/descent 8 steps with B handrails with S; pt requests to sit d/t dizziness. BP assessed in sitting 125/54, standing 114/65 however unable to stand through duration of dinamap running d/t dizziness again and returned to sitting. Nephrologist arrived during session and therapist reported orthostasis. Performed sit <>stand no UEs 2x10 reps, standing heel raises 2x15 on wedge for dorsiflexion ROM, hamstring curls 2x10 reps; seated rest break between each set d/t dizziness. Standing balance with ball toss with another patient, progressed to unsupported standing on airex foam pad, min guard for balance. Nustep x7 min total, occasional short rest breaks for fatigue and to talk with visiting MD. Returned to room [place] for energy conservation, remained seated in w/c with lap belt alarm intact and all needs in reach.      Therapy Documentation Precautions:  Precautions Precautions: Fall Restrictions Weight Bearing Restrictions: No    Therapy/Group: Concurrent  Benjiman Core Seigle 06/23/2018, 11:13 AM

## 2018-06-24 ENCOUNTER — Inpatient Hospital Stay (HOSPITAL_COMMUNITY): Payer: Medicare Other | Admitting: Occupational Therapy

## 2018-06-24 ENCOUNTER — Inpatient Hospital Stay (HOSPITAL_COMMUNITY): Payer: Medicare Other | Admitting: Physical Therapy

## 2018-06-24 ENCOUNTER — Inpatient Hospital Stay (HOSPITAL_COMMUNITY): Payer: Medicare Other | Admitting: Speech Pathology

## 2018-06-24 LAB — RENAL FUNCTION PANEL
Albumin: 2.9 g/dL — ABNORMAL LOW (ref 3.5–5.0)
Anion gap: 14 (ref 5–15)
BUN: 28 mg/dL — ABNORMAL HIGH (ref 8–23)
CHLORIDE: 95 mmol/L — AB (ref 98–111)
CO2: 23 mmol/L (ref 22–32)
Calcium: 9.6 mg/dL (ref 8.9–10.3)
Creatinine, Ser: 4.93 mg/dL — ABNORMAL HIGH (ref 0.61–1.24)
GFR calc Af Amer: 13 mL/min — ABNORMAL LOW (ref >60)
GFR, EST NON AFRICAN AMERICAN: 11 mL/min — AB (ref >60)
Glucose, Bld: 105 mg/dL — ABNORMAL HIGH (ref 70–99)
Phosphorus: 2.2 mg/dL — ABNORMAL LOW (ref 2.5–4.6)
Potassium: 3.4 mmol/L — ABNORMAL LOW (ref 3.5–5.1)
Sodium: 132 mmol/L — ABNORMAL LOW (ref 135–145)

## 2018-06-24 LAB — CBC
HCT: 23.4 % — ABNORMAL LOW (ref 39.0–52.0)
Hemoglobin: 7.5 g/dL — ABNORMAL LOW (ref 13.0–17.0)
MCH: 29.2 pg (ref 26.0–34.0)
MCHC: 32.1 g/dL (ref 30.0–36.0)
MCV: 91.1 fL (ref 80.0–100.0)
Platelets: 237 10*3/uL (ref 150–400)
RBC: 2.57 MIL/uL — ABNORMAL LOW (ref 4.22–5.81)
RDW: 18.2 % — ABNORMAL HIGH (ref 11.5–15.5)
WBC: 12.8 10*3/uL — ABNORMAL HIGH (ref 4.0–10.5)
nRBC: 0.2 % (ref 0.0–0.2)

## 2018-06-24 LAB — PREPARE RBC (CROSSMATCH)

## 2018-06-24 MED ORDER — SODIUM CHLORIDE 0.9% IV SOLUTION: Freq: Once | INTRAVENOUS | Status: DC

## 2018-06-24 NOTE — Progress Notes (Signed)
[place] PHYSICAL MEDICINE & REHABILITATION PROGRESS NOTE   Subjective/Complaints: Patient seen laying in bed this morning.  He states he slept well overnight.  He denies complaints.  ROS: Denies CP, SOB, N/V/D   Objective:   No results found. Recent Labs    06/22/18 1342 06/24/18 0536  WBC 13.3* 12.8*  HGB 7.1* 7.5*  HCT 23.1* 23.4*  PLT 257 237   Recent Labs    06/22/18 1342  NA 133*  K 3.5  CL 96*  CO2 23  GLUCOSE 144*  BUN 47*  CREATININE 5.94*  CALCIUM 9.7    Intake/Output Summary (Last 24 hours) at 06/24/2018 0815 Last data filed at 06/24/2018 0109 Gross per 24 hour  Intake 680 ml  Output 650 ml  Net 30 ml     Physical Exam: Vital Signs Blood pressure (!) 141/69, pulse (!) 101, temperature 98.6 F (37 C), resp. rate 19, height 5\' 8"  (1.727 m), weight 62.2 kg, SpO2 100 %.  Constitutional: No distress . Vital signs reviewed. HENT: Normocephalic.  Atraumatic. Eyes: EOMI. No discharge. Cardiovascular: RRR.  No JVD. Respiratory: CTA bilaterally.  Normal effort. GI: BS +. Non-distended. Musc: No edema or tenderness in extremities. Neuro: Alert  Motor: 5/5 prox to distal RUE/RLE, stable 4+/5 LUE/LLE, stable Skin: Clean and intact without signs of breakdown, AVG LUE Psych: Normal mood.  Normal behavior.  Assessment/Plan: 1. Functional deficits secondary to RIght MCA infarct which require 3+ hours per day of interdisciplinary therapy in a comprehensive inpatient rehab setting.  Physiatrist is providing close team supervision and 24 hour management of active medical problems listed below.  Physiatrist and rehab team continue to assess barriers to discharge/monitor patient progress toward functional and medical goals  Care Tool:  Bathing    Body parts bathed by patient: Right arm, Left arm, Chest, Abdomen, Front perineal area, Buttocks, Right upper leg, Left upper leg, Right lower leg, Left lower leg, Face         Bathing assist Assist Level:  Supervision/Verbal cueing     Upper Body Dressing/Undressing Upper body dressing   What is the patient wearing?: Pull over shirt    Upper body assist Assist Level: Set up assist    Lower Body Dressing/Undressing Lower body dressing      What is the patient wearing?: Pants, Underwear/pull up     Lower body assist Assist for lower body dressing: Supervision/Verbal cueing     Toileting Toileting    Toileting assist Assist for toileting: Contact Guard/Touching assist     Transfers Chair/bed transfer  Transfers assist     Chair/bed transfer assist level: Supervision/Verbal cueing     Locomotion Ambulation   Ambulation assist      Assist level: Supervision/Verbal cueing Assistive device: Walker-rolling Max distance: 150   Walk 10 feet activity   Assist     Assist level: Supervision/Verbal cueing Assistive device: Walker-rolling   Walk 50 feet activity   Assist    Assist level: Supervision/Verbal cueing Assistive device: Walker-rolling    Walk 150 feet activity   Assist    Assist level: Supervision/Verbal cueing Assistive device: Walker-rolling    Walk 10 feet on uneven surface  activity   Assist     Assist level: Moderate Assistance - Patient - 50 - 74% Assistive device: Aeronautical engineer Will patient use wheelchair at discharge?: No Type of Wheelchair: Manual    Wheelchair assist level: Supervision/Verbal cueing Max wheelchair distance: 50    Wheelchair  50 feet with 2 turns activity    Assist        Assist Level: Supervision/Verbal cueing   Wheelchair 150 feet activity     Assist Wheelchair 150 feet activity did not occur: Safety/medical concerns        Medical Problem List and Plan: 1. Decrease mobility and ADLs secondary to Right MCA infarct   Continue CIR 2. DVT Prophylaxis/Anticoagulation: Pharmaceutical:Lovenox reduced to 30mg  per day 3. Pain Management:Tylenol as  needed 4. Mood:LCSW to follow for evaluation and support 5. Neuropsych: This patient is capable of making decisions on his own behalf. 6. Skin/Wound Care:Routine pressure relief measures 7. Fluids/Electrolytes/Nutrition:Monitor I's and O's 8. ESRD stage V: HD M W F .Per Nephro 9. Anemia of chronic disease: Started on darbepoetinfor supplementation  Hgb 7.5 on 12/19  Recs per nephro 10. HTN: Monitor bid. Continue Cardura.  Vitals:   06/23/18 1951 06/24/18 0517  BP: 134/60 (!) 141/69  Pulse: (!) 108 (!) 101  Resp: 16 19  Temp: 98.2 F (36.8 C) 98.6 F (37 C)  SpO2: 100% 100%   Controlled 12/19 11. Neuropathy:   gabapentin at renal dose--once daily. 12. OSA: Continue to use CPAP when napping or when asleep. 13.  Hyperglycemia  CBGs elevated on 12/17  Continue to monitor 14.  Leukocytosis  WBCs 12.8 on 12/19  Afebrile  Continue to monitor 15.  Urinary retention  PVR showing retention  I/O caths LOS: 8 days A FACE TO FACE EVALUATION WAS PERFORMED  Cayley Pester Lorie Phenix 06/24/2018, 8:15 AM

## 2018-06-24 NOTE — Progress Notes (Addendum)
Social Work Patient ID: Jason Cotta Sr., male   DOB: [birth year], 72 y.o.   MRN: [id number] Met with pt and wife who was here to observe him in therapies, to discuss team conference goals supervision level and discharge date 12/24. Wife feels she will be able to do this, not sure about if she will ned to I & O cath him though. Nursing to address this. Discussed home health and equipment needs, team still deciding. Have contacted [facility] to make aware of the discharge date since she is setting up [facility]-HD. Continue to work toward discharge 12/24.

## 2018-06-24 NOTE — Progress Notes (Signed)
Occupational Therapy Weekly Progress Note  Patient Details  Name: Jason [name] Sr. MRN: [id number] Date of Birth: 1946-05-09  Beginning of progress report period: June 17, 2018 End of progress report period: June 24, 2018  Today's Date: 06/24/2018 OT Individual Time: 2694-8546 OT Individual Time Calculation (min): 30 min  and Today's Date: 06/24/2018 OT Missed Time: 45 Minutes Missed Time Reason: Patient fatigue   Patient has met 3 of 3 short term goals.  Pt is making steady progress towards OT goals. He cont to be most limited by decreased functional activity tolerance and cognitive deficits. He requires min cuing during basic ADLs for safety awareness and use of RW. He is able to use L UE at non-dominant level at mod I level during ADLs. Pt's wife has been present intermittently throughout CIR stay and education provided regarding pt's CLOF and POC. Pt will require 24hr supervision at d/c, pt's wife reports being willing and able to provide this assist at d/c.   Patient continues to demonstrate the following deficits: ataxia, hemiplegia affecting non-dominant side and muscle weakness (generalized) and therefore will continue to benefit from skilled OT intervention to enhance overall performance with BADL and Reduce care partner burden.  Patient progressing toward long term goals..  Continue plan of care.  OT Short Term Goals Week 1:  OT Short Term Goal 1 (Week 1): Pt will stand to compete 2 grooming tasks with supervision in order to increase functional activity tolerance OT Short Term Goal 1 - Progress (Week 1): Met OT Short Term Goal 2 (Week 1): Pt will complete toilet transfer with supervision OT Short Term Goal 2 - Progress (Week 1): Met OT Short Term Goal 3 (Week 1): Pt will independently recall hemi-dressing techniques for UB and LB OT Short Term Goal 3 - Progress (Week 1): Met Week 2:  OT Short Term Goal 1 (Week 2): STG=LTG due to LOS  Skilled Therapeutic  Interventions/Progress Updates:    Therapist arrived at 8:30 for scheduled OT session. Pt asleep in supine upon arrival. Pt easily awoken, however, refusing therapy at this time due to "fatigue" despite encouragement and education pt cont to refuse. Will attempt to make up as able. Therapist returned at 9:15, pt in supine with wife and RN present, pt alert and agreeable to working with therapist. He transferred to EOB with min A using hospital bed functions. He ambulated throughout room with close supervision using RW. Completed toileting tasks with guarding assist. He ambulated back to room. He dressed seated in w/c, VCs for orienting pants correctly. He stood at [facility] with supervision to pull pants up. Figure four position to don socks with supervision. He was left seated in w/c at sink to compete grooming tasks with wife present and awaiting hand off to SLP. Pt's wife present for session, education provided regarding DME, continuum of care, pt's CLOF and goal level, d/c recommendations and d/c planning.   Therapy Documentation Precautions:  Precautions Precautions: Fall Restrictions Weight Bearing Restrictions: No Pain: Pain Assessment Pain Scale: 0-10 Pain Score: 6  Pain Type: Acute pain Pain Location: Back Pain Descriptors / Indicators: Aching Pain Frequency: Occasional Pain Onset: Gradual Patients Stated Pain Goal: 0 Pain Intervention(s): RN aware, repositioned   Therapy/Group: Individual Therapy  Cassie Henkels L 06/24/2018, 7:01 AM

## 2018-06-24 NOTE — Progress Notes (Signed)
Subjective:   Seen and examined on dialysis and procedure supervised.  Blood pressure 123/63 and HR 83.  BF 300.  Cannulation went ok per RN.  Tolerating goal.  We discussed risks/benefits and indications for transfusion and he consents to transfusion.  ROS: dizziness; denies shortness of breath or chest pain; denies nausea or vomiting    Objective Vital signs in last 24 hours: Vitals:   06/23/18 0517 06/23/18 1556 06/23/18 1951 06/24/18 0517  BP: 110/61 120/63 134/60 (!) 141/69  Pulse: 92 97 (!) 108 (!) 101  Resp: 15 18 16 19   Temp: 98.7 F (37.1 C) 98.6 F (37 C) 98.2 F (36.8 C) 98.6 F (37 C)  TempSrc: Oral Oral Oral   SpO2: 100% 100% 100% 100%  Weight: 60.7 kg   62.2 kg  Height:       Weight change: 0.1 kg  Intake/Output Summary (Last 24 hours) at 06/24/2018 1321 Last data filed at 06/24/2018 0109 Gross per 24 hour  Intake 360 ml  Output 650 ml  Net -290 ml    Assessment/ Plan: Pt is a 72 y.o. yo male with advanced CKD who was admitted on 06/16/2018 with a CVA- dialysis initiated this hospitalization    Assessment/Plan: 1. ESRD - he has initiated HD this hospitalization.  HD per TTS schedule with HD today.  Patient has been given a TTS spot at the [facility] outpatient unit.  We have decreased his goal UF/raised dry weight given dizziness 2.  Access-some difficulty with cannulation.  Dr. Donzetta Matters placed stent on 12/9.  Cannulation going well at least past couple of treatments.  Working identify issues prior to discharge 3. Anemia- Slowly improving.  Iron stores low and on repletion.  On ESA (s/p recent dose increase to 200 mcg weekly).  Transfuse 1 unit PRBC's with HD today. 4. Secondary hyperparathyroidism- PTH 164.  On daily calcitriol.  Phos acceptable without a binder  5. HTN/volume- acceptable on decreased dose of cardura.    Dispo planning: patient states he is to be discharged on 12/24  [place]: Basic Metabolic Panel: Recent Labs  Lab  06/20/18 1751 06/22/18 1342  NA 130* 133*  K 3.4* 3.5  CL 97* 96*  CO2 19* 23  GLUCOSE 137* 144*  BUN 64* 47*  CREATININE 7.01* 5.94*  CALCIUM 9.2 9.7  PHOS 2.7 3.5   Liver Function Tests: Recent Labs  Lab 06/20/18 1751 06/22/18 1342  ALBUMIN 2.6* 2.7*   No results for input(s): LIPASE, AMYLASE in the last 168 hours. No results for input(s): AMMONIA in the last 168 hours. CBC: Recent Labs  Lab 06/20/18 1750 06/22/18 1342 06/24/18 0536  WBC 10.6* 13.3* 12.8*  HGB 7.3* 7.1* 7.5*  HCT 22.6* 23.1* 23.4*  MCV 89.0 91.7 91.1  PLT 260 257 237   Cardiac Enzymes: No results for input(s): CKTOTAL, CKMB, CKMBINDEX, TROPONINI in the last 168 hours. CBG: No results for input(s): GLUCAP in the last 168 hours.  Iron Studies: No results for input(s): IRON, TIBC, TRANSFERRIN, FERRITIN in the last 72 hours. Studies/Results: No results found. Medications: Infusions: . sodium chloride      Scheduled Medications: . sodium chloride   Intravenous Once  . allopurinol  100 mg Oral Daily  . aspirin  81 mg Oral Daily  . atorvastatin  20 mg Oral q1800  . calcitRIOL  0.25 mcg Oral Daily  . chlorhexidine  15 mL Mouth Rinse BID  . Chlorhexidine Gluconate Cloth  6 each Topical  [id number]  . Chlorhexidine Gluconate Cloth  6 each Topical Q0600  . cholecalciferol  5,000 Units Oral Daily  . darbepoetin (ARANESP) injection - DIALYSIS  200 mcg Intravenous Q Tue-HD  . doxazosin  2 mg Oral Daily  . enoxaparin (LOVENOX) injection  30 mg Subcutaneous Q24H  . gabapentin  300 mg Oral QHS  . multivitamin  1 tablet Oral QHS  . pantoprazole  40 mg Oral Daily  . polycarbophil  1,250 mg Oral BID  . saccharomyces boulardii  250 mg Oral BID  . ticagrelor  90 mg Oral BID    have reviewed scheduled and prn medications.  Physical Exam: General: NAD Heart: RRR; no rubs Lungs: clear to auscultation and unlabored Abdomen: soft, non tender/NT Extremities: no lower extremity edema Dialysis Access: left  upper AVF  06/24/2018,1:21 PM  LOS: 8 days

## 2018-06-24 NOTE — Progress Notes (Signed)
Physical Therapy Weekly Progress Note  Patient Details  Name: Jason FAIRHURST Sr. MRN: [id number] Date of Birth: August 26, 1945  Beginning of progress report period: June 17, 2018 End of progress report period: June 24, 2018  Today's Date: 06/24/2018 PT Individual Time: 1100-1155 PT Individual Time Calculation (min): 55 min   Patient has met 4 of 4 short term goals.  Patient currently performing bed mobility with S, transfers and gait with S to min guard with RW, stairs with min guard. Variable performance and participation d/t new dialysis and issues with fatigue and orthostatic hypotension, however pt remains motivated and hard working when able to participate.   Patient continues to demonstrate the following deficits muscle weakness, decreased cardiorespiratoy endurance, unbalanced muscle activation and decreased coordination and decreased standing balance, decreased postural control, hemiplegia and decreased balance strategies and therefore will continue to benefit from skilled PT intervention to increase functional independence with mobility.  Patient progressing toward long term goals..  Continue plan of care.  PT Short Term Goals Week 1:  PT Short Term Goal 1 (Week 1): Pt will perform bed mobility modI PT Short Term Goal 1 - Progress (Week 1): Met PT Short Term Goal 2 (Week 1): Pt will perform transfers with S PT Short Term Goal 2 - Progress (Week 1): Met PT Short Term Goal 3 (Week 1): Pt will ambulate 150' with RW and min guard PT Short Term Goal 4 (Week 1): Pt will perform ascent/descent 12 steps with B handrails and min guard for strengthening and endurance PT Short Term Goal 4 - Progress (Week 1): Met Week 2:  PT Short Term Goal 1 (Week 2): =LTG due to estimated LOS  Skilled Therapeutic Interventions/Progress Updates: Pt received seated in w/c; c/o pain in low back as below and agreeable to treatment. Reviewed recommendations for S overall with RW with wife who was present and  discussed options for home health vs outpatient follow up and consideration of dialysis and fatigue; pt and wife both report they are interested in going to outpatient, therapist will follow up with CSW. Transported totalA to day room for energy conservation. Nustep BUE/BLE level 2 with average 40 steps/min for strengthening and aerobic endurance; limited d/t back pain and discomfort in seat. Seated LE strengthening exercises 2x15 each including hip flexion marching, long arc quads with 2# ankle weights. Sit <>stand x10 reps; unable to complete additional sets and declines standing exercises d/t back pain. Provided pt with handout of exercises for continued performance in room independently and at d/c. LUE peg board for focus on coordination and fine motor control. BUE w/c propulsion for LUE strengthening/coordination. Remained in w/c at end of session, lap belt alarm, all needs in reach.      Therapy Documentation Precautions:  Precautions Precautions: Fall Restrictions Weight Bearing Restrictions: No Pain: Pain Assessment Pain Scale: 0-10 Pain Score: 4  Pain Type: Acute pain Pain Location: Back Pain Descriptors / Indicators: Aching Pain Frequency: Occasional Pain Onset: Gradual Pain Intervention(s): Repositioned   Therapy/Group: Individual Therapy  Corliss Skains 06/24/2018, 12:04 PM

## 2018-06-24 NOTE — Progress Notes (Signed)
Speech Language Pathology Weekly Progress and Session Note  Patient Details  Name: Jason DOUTHAT Sr. MRN: [id number] Date of Birth: 1946-03-14  Beginning of progress report period: 06/17/2018  End of progress report period: 06/24/2018  Today's Date: 06/24/2018 SLP Individual Time: 1595-3967 SLP Individual Time Calculation (min): 57 min  Short Term Goals: Week 1: SLP Short Term Goal 1 (Week 1): Pt will overarticulate, increase vocal intensity, and use slow rate of speech to achieve intelligibility in conversations  SLP Short Term Goal 1 - Progress (Week 1): Progressing toward goal SLP Short Term Goal 2 (Week 1): Pt will selectively attend to tasks in a mildly distracting environment for 15 minute intervals with min cues for redirection.  SLP Short Term Goal 2 - Progress (Week 1): Met SLP Short Term Goal 3 (Week 1): Pt will complete functional tasks with min assist for functional problem solving.   SLP Short Term Goal 3 - Progress (Week 1): Progressing toward goal SLP Short Term Goal 4 (Week 1): Pt will recall daily information with min assist cues for use of external aids.   SLP Short Term Goal 4 - Progress (Week 1): Met    New Short Term Goals: Week 2: SLP Short Term Goal 1 (Week 2): STG=LTG due to remaining length of stay  Weekly Progress Updates:   Pt has made slow, functional gains this reporting period and has met 2 out of 4 short term goals.  Pt is currently min-mod assist for tasks due to mild-moderate cognitive deficits.  Pt has demonstrated improved selective attention to tasks.  Pt and family education is ongoing.  Pt would continue to benefit from skilled ST while inpatient in order to maximize functional independence and reduce burden of care prior to discharge.  Anticipate that pt will need x24/7 supervision at discharge in addition to ST follow at next level of care.    Intensity: Minumum of 1-2 x/day, 30 to 90 minutes Frequency: 3 to 5 out of 7 days Duration/Length of  Stay: 10-12 days  Treatment/Interventions: Cueing hierarchy;Environmental controls;Functional tasks;Internal/external aids;Speech/Language facilitation;Patient/family education   Daily Session  Skilled Therapeutic Interventions: Pt was seen for skilled ST targeting cognitive goals.  SLP facilitated the session with medication management tasks to address problem solving goal.  Pt needed mod assist overall to organize pills into a twice daily pill box due to decreased attention to task and decreased task organization.  Pt would frequently lose his place while reading from a medication list and needed mod cues to recognize and correct errors.  Discussed recommendations with pt and family that pt have assistance for medication and financial management at discharge.  Pt and wife both verbalized understanding and were in agreement with recommendations.  Goals updated on this date to reflect current progress and plan of care.        General    Pain Pain Assessment Pain Scale: 0-10 Pain Score: 0-No pain  Therapy/Group: Individual Therapy  Anjanette Gilkey, Selinda Orion 06/24/2018, 2:23 PM

## 2018-06-25 ENCOUNTER — Inpatient Hospital Stay (HOSPITAL_COMMUNITY): Payer: Medicare Other | Admitting: Speech Pathology

## 2018-06-25 ENCOUNTER — Inpatient Hospital Stay (HOSPITAL_COMMUNITY): Payer: Medicare Other | Admitting: Physical Therapy

## 2018-06-25 ENCOUNTER — Inpatient Hospital Stay (HOSPITAL_COMMUNITY): Payer: Medicare Other | Admitting: Occupational Therapy

## 2018-06-25 LAB — BPAM RBC
BLOOD PRODUCT EXPIRATION DATE: 202001012359
ISSUE DATE / TIME: 201912191505
Unit Type and Rh: 8400

## 2018-06-25 LAB — TYPE AND SCREEN
ABO/RH(D): AB POS
Antibody Screen: NEGATIVE
Unit division: 0

## 2018-06-25 MED ORDER — CHLORHEXIDINE GLUCONATE CLOTH 2 % EX PADS: 6.0000 | MEDICATED_PAD | Freq: Every day | CUTANEOUS | Status: DC

## 2018-06-25 NOTE — Progress Notes (Signed)
Occupational Therapy Session Note  Patient Details  Name: Jason INGE Sr. MRN: [id number] Date of Birth: 05/28/1946  Today's Date: 06/25/2018 OT Individual Time: 0945-1100 OT Individual Time Calculation (min): 75 min    Short Term Goals: Week 2:  OT Short Term Goal 1 (Week 2): STG=LTG due to LOS  Skilled Therapeutic Interventions/Progress Updates:    Pt seen for OT session focusing on family education and UE neuro re=ed. PT sitting up in w/c upon arrival with wife present, pt agreeable totx session and denying need for bathing routine today. PT denied pain. He ambulated throughout unit with RW and supervision, required min cuing for awareness to environmental obstacles on L as well as RW management.  In ADL apartment, pt's wife and pt educatied regarding tub tranfer bench and technique for transfer. Pt return demonstrated transfer with supervision. Pt and wfie believe this will work for d/c. He completed sit>stand from low soft surface couch and completed bed mobility on standard bed, both transfers completed with supervision. Extensive education provided regarding simple home modifications to reduce fall risk, DME, continuum of care, d/c planning, modified ADLs, importance of participation and independence with ADLs.  Intherapy gym, pt sat EOM to complete 9 hole peg test, see results below.  Pt then worked on fine motor table top task, threading beads onto small string in simulation of threading fishing hook on line as pt enjoys doing at home. Completed with slightly increased time as pt able to alternate use of R UE during task with same level of success. Pt returned to room at end of session, left seated in w/c with all needs in reach and wife present, chair belt alarm donned. Pt's wife voiced feeling comfortable with family ed session and is comfortable to provide needed supervision assist at d/c.    9 hole peg test: R: 43.03, 43.3and 39.33 L 46.63, 48.02, and 48.57/   Therapy  Documentation Precautions:  Precautions Precautions: Fall Restrictions Weight Bearing Restrictions: No Pain:   No/denies pain   Therapy/Group: Individual Therapy  Jacayla Nordell L 06/25/2018, 6:49 AM

## 2018-06-25 NOTE — Progress Notes (Signed)
Patient called at approximately 0600 for a bladder scan. Volume was 201 ml. No acute distress noted.

## 2018-06-25 NOTE — Progress Notes (Signed)
Physical Therapy Session Note  Patient Details  Name: Jason KOCSIS Sr. MRN: [id number] Date of Birth: August 06, 1945  Today's Date: 06/25/2018 PT Individual Time: 0800-0900 and 1330-1415 PT Individual Time Calculation (min): 60 min and 45 min (total 105 min)  Short Term Goals: Week 2:  PT Short Term Goal 1 (Week 2): =LTG due to estimated LOS  Skilled Therapeutic Interventions/Progress Updates: Tx 1:Pt received in bed, denies pain reporting back pain much better from yesterday; agreeable to treatment. Supine>sit from flat bed with S. Lower body dressing with setupA with exception of TEDs donned totalA; RW for sit <>stand with S. Ambulatory transfer with RW to sink; pt performed upper body bathing and grooming at sink while standing. Denies lightheadedness while standing today. Toileting performed with S and increased time. Gait x150' with RW and S; further distance limited d/t reported fatigue. Standing balance with dynamic reaching behind to mat table to retrieve cards then match to board for focus on standing endurance, dynamic balance with reaching, visual scanning and problem solving. Standing balance on airex while engaged in puzzle; required max cues for problem solving puzzle. Stairs ascent/descent 12 steps with B handrails and S; reciprocal pattern. Returned to room totalA for energy conservation, remained seated in w/c at end of session, lap belt alarm intact and all needs in reach.   Tx 2: Pt received seated in recliner, denies pain and agreeable to treatment. Gait within room with RW and S to organize belongings. Gait to gym RW and S x150'. Assessed Berg as below with scores indicative of increased fall risk. Returned to room totalA in w/c d/t urgency to void; ambulatory transfer in/out of bathroom with RW and S. Gait throughout unit 2x150' with RW and S. Engaged in dynavision with LUE (average 1.9sec reaction speed) and RUE (average 1.6 sec reaction speed) mode A x2 min each; educated pt  regarding results and expectation that L be slower d/t R dominance and stroke on L side, however not significant difference. Returned to room totalA in w/c. Stand pivot to bed with S. Remained in bed, alarm intact and all needs in reach.      Therapy Documentation Precautions:  Precautions Precautions: Fall Restrictions Weight Bearing Restrictions: No Pain: Pain Assessment Pain Scale: 0-10 Pain Score: 0-No pain    Therapy/Group: Individual Therapy  Corliss Skains 06/25/2018, 10:55 AM

## 2018-06-25 NOTE — Progress Notes (Signed)
[place] PHYSICAL MEDICINE & REHABILITATION PROGRESS NOTE   Subjective/Complaints: Patient seen sitting up in bed this morning.  He states he slept well overnight.  He has questions about his Lasix.  ROS: Denies CP, SOB, N/V/D   Objective:   No results found. Recent Labs    06/22/18 1342 06/24/18 0536  WBC 13.3* 12.8*  HGB 7.1* 7.5*  HCT 23.1* 23.4*  PLT 257 237   Recent Labs    06/22/18 1342 06/24/18 1323  NA 133* 132*  K 3.5 3.4*  CL 96* 95*  CO2 23 23  GLUCOSE 144* 105*  BUN 47* 28*  CREATININE 5.94* 4.93*  CALCIUM 9.7 9.6    Intake/Output Summary (Last 24 hours) at 06/25/2018 0831 Last data filed at 06/25/2018 0801 Gross per 24 hour  Intake 1239 ml  Output 1400 ml  Net -161 ml     Physical Exam: Vital Signs Blood pressure (!) 118/54, pulse 86, temperature 98 F (36.7 C), temperature source Oral, resp. rate 14, height 5\' 8"  (1.727 m), weight 60.5 kg, SpO2 100 %.  Constitutional: No distress . Vital signs reviewed. HENT: Normocephalic.  Atraumatic. Eyes: EOMI. No discharge. Cardiovascular: RRR.  No JVD. Respiratory: CTA bilaterally.  Normal effort. GI: BS +. Non-distended. Musc: No edema or tenderness in extremities. Neuro: Alert  Motor: 5/5 prox to distal RUE/RLE, unchanged 4+/5 LUE/LLE, unchanged Skin: Clean and intact without signs of breakdown, AVG LUE Psych: Normal mood.  Normal behavior.  Assessment/Plan: 1. Functional deficits secondary to RIght MCA infarct which require 3+ hours per day of interdisciplinary therapy in a comprehensive inpatient rehab setting.  Physiatrist is providing close team supervision and 24 hour management of active medical problems listed below.  Physiatrist and rehab team continue to assess barriers to discharge/monitor patient progress toward functional and medical goals  Care Tool:  Bathing    Body parts bathed by patient: Right arm, Left arm, Chest, Abdomen, Front perineal area, Buttocks, Right upper  leg, Left upper leg, Right lower leg, Left lower leg, Face         Bathing assist Assist Level: Supervision/Verbal cueing     Upper Body Dressing/Undressing Upper body dressing   What is the patient wearing?: Pull over shirt    Upper body assist Assist Level: Set up assist    Lower Body Dressing/Undressing Lower body dressing      What is the patient wearing?: Pants, Underwear/pull up     Lower body assist Assist for lower body dressing: Supervision/Verbal cueing     Toileting Toileting    Toileting assist Assist for toileting: Contact Guard/Touching assist     Transfers Chair/bed transfer  Transfers assist     Chair/bed transfer assist level: Supervision/Verbal cueing     Locomotion Ambulation   Ambulation assist      Assist level: Supervision/Verbal cueing Assistive device: Walker-rolling Max distance: 150   Walk 10 feet activity   Assist     Assist level: Supervision/Verbal cueing Assistive device: Walker-rolling   Walk 50 feet activity   Assist    Assist level: Supervision/Verbal cueing Assistive device: Walker-rolling    Walk 150 feet activity   Assist    Assist level: Supervision/Verbal cueing Assistive device: Walker-rolling    Walk 10 feet on uneven surface  activity   Assist     Assist level: Moderate Assistance - Patient - 50 - 74% Assistive device: Aeronautical engineer Will patient use wheelchair at discharge?: No Type of Wheelchair: Manual  Wheelchair assist level: Supervision/Verbal cueing Max wheelchair distance: 50    Wheelchair 50 feet with 2 turns activity    Assist        Assist Level: Supervision/Verbal cueing   Wheelchair 150 feet activity     Assist Wheelchair 150 feet activity did not occur: Safety/medical concerns        Medical Problem List and Plan: 1. Decrease mobility and ADLs secondary to Right MCA infarct   Continue CIR 2. DVT  Prophylaxis/Anticoagulation: Pharmaceutical:Lovenox reduced to 30mg  per day 3. Pain Management:Tylenol as needed 4. Mood:LCSW to follow for evaluation and support 5. Neuropsych: This patient is capable of making decisions on his own behalf. 6. Skin/Wound Care:Routine pressure relief measures 7. Fluids/Electrolytes/Nutrition:Monitor I's and O's 8. ESRD stage V: HD M W F .Per Nephro 9. Anemia of chronic disease: Started on darbepoetinfor supplementation  Hgb 7.5 on 12/19  Recs per nephro 10. HTN: Monitor bid. Continue Cardura.  Vitals:   06/24/18 1951 06/25/18 0517  BP: 135/61 (!) 118/54  Pulse: 92 86  Resp: 12 14  Temp: 99.8 F (37.7 C) 98 F (36.7 C)  SpO2: 100% 100%   Controlled 12/20 11. Neuropathy:   gabapentin at renal dose--once daily. 12. OSA: Continue to use CPAP when napping or when asleep. 13.  Hyperglycemia  Blood glucose relatively controlled on 12/20  Continue to monitor 14.  Leukocytosis  WBCs 12.8 on 12/19  Afebrile  Continue to monitor 15.  Urinary retention  PVR showing retention  I/O caths  Patient would like Lasix resumed-encouraged discussion with nephro, especially given blood pressure.  LOS:9 days A FACE TO FACE EVALUATION WAS PERFORMED  Jaceyon Strole Lorie Phenix 06/25/2018, 8:31 AM

## 2018-06-25 NOTE — Progress Notes (Signed)
Explained to the patient that his catheterization orders had changed & that we were ordered only to catheterize him prn when he felt full. Patient stated that he was starting to feel that way. He was unable to urinate at that time. He called the nursing station a little while after that requesting to be scanned & the volume was below 321ml. He was cathed due to discomfort & 400 ml was able to be cleared from the bladder. After the procedure, patient was touching a large area on his right thigh just below the groin that feels very firm & is swollen. No noted redness or sign of an abscess. He states that it is tender. Communication left for the provider to assess on his rounds in the morning. He also had a low grade temp during vital rounds. No acute distress noted. Will continue to monitor.

## 2018-06-25 NOTE — Progress Notes (Signed)
Subjective:   Patient last dialyzed yesterday and received a unit of blood at that time.  Spoke with patient and his wife.  He has a hard nodule on his upper right thigh which is somewhat painful.  He states he discussed with team this AM.  ROS:  Dizziness improved Denies shortness of breath or chest pain  Denies nausea or vomiting   Objective Vital signs in last 24 hours: Vitals:   06/24/18 1700 06/24/18 1713 06/24/18 1951 06/25/18 0517  BP: 139/84 (!) 139/56 135/61 (!) 118/54  Pulse: 99 100 92 86  Resp:  18 12 14   Temp:  98.9 F (37.2 C) 99.8 F (37.7 C) 98 F (36.7 C)  TempSrc:  Oral Oral Oral  SpO2:  98% 100% 100%  Weight:  60.2 kg  60.5 kg  Height:       Weight change: 0.1 kg  Intake/Output Summary (Last 24 hours) at 06/25/2018 5597 Last data filed at 06/25/2018 0801 Gross per 24 hour  Intake 1239 ml  Output 1400 ml  Net -161 ml    Assessment/ Plan: Pt is a 72 y.o. yo male with advanced CKD who was admitted on 06/16/2018 with a CVA- dialysis initiated this hospitalization    Assessment/Plan: 1. ESRD - initiated HD this hospitalization.  HD per TTS schedule with next treatment on 12/21.  - Patient has been given a TTS 12:40 pm spot at the [facility] outpatient unit.  Dry weight is around 60.5 - 61 kg   2. Access-some difficulty with cannulation earlier in hospitalization.  Dr. Donzetta Matters placed stent on 12/9.  Cannulation going well the past couple of treatments  3. Anemia- Slowly improving.  Iron deficient and on repletion.  On ESA (s/p recent dose increase to 200 mcg weekly).  S/p 1 unit PRBC's with HD on 12/19  4. Secondary hyperparathyroidism- PTH 164.  Has received daily calcitriol as inpatient - will transition to management at HD unit as an outpatient.  Phos acceptable without a binder  5. HTN/volume- acceptable on decreased dose of cardura.  6. Chronic systolic CHF (EF 41-63% per TTE this hospitalization) - UF with HD as tolerated.  For now holding lasix  given report of dizziness and volume management with HD.  He may ultimately need lasix on non-HD days  7. Right leg nodule  - Would consider ultrasound for further evaluation - defer to primary team discretion.  Spoke with the patient's nurse   Dispo planning: patient states he is to be discharged on 12/24  [place]: Basic Metabolic Panel: Recent Labs  Lab 06/20/18 1751 06/22/18 1342 06/24/18 1323  NA 130* 133* 132*  K 3.4* 3.5 3.4*  CL 97* 96* 95*  CO2 19* 23 23  GLUCOSE 137* 144* 105*  BUN 64* 47* 28*  CREATININE 7.01* 5.94* 4.93*  CALCIUM 9.2 9.7 9.6  PHOS 2.7 3.5 2.2*   Liver Function Tests: Recent Labs  Lab 06/20/18 1751 06/22/18 1342 06/24/18 1323  ALBUMIN 2.6* 2.7* 2.9*   No results for input(s): LIPASE, AMYLASE in the last 168 hours. No results for input(s): AMMONIA in the last 168 hours. CBC: Recent Labs  Lab 06/20/18 1750 06/22/18 1342 06/24/18 0536  WBC 10.6* 13.3* 12.8*  HGB 7.3* 7.1* 7.5*  HCT 22.6* 23.1* 23.4*  MCV 89.0 91.7 91.1  PLT 260 257 237   Cardiac Enzymes: No results for input(s): CKTOTAL, CKMB, CKMBINDEX, TROPONINI in the last 168 hours. CBG: No results for input(s): GLUCAP in  the last 168 hours.  Iron Studies: No results for input(s): IRON, TIBC, TRANSFERRIN, FERRITIN in the last 72 hours. Studies/Results: No results found. Medications: Infusions: . sodium chloride      Scheduled Medications: . sodium chloride   Intravenous Once  . allopurinol  100 mg Oral Daily  . aspirin  81 mg Oral Daily  . atorvastatin  20 mg Oral q1800  . calcitRIOL  0.25 mcg Oral Daily  . chlorhexidine  15 mL Mouth Rinse BID  . Chlorhexidine Gluconate Cloth  6 each Topical Q0600  . cholecalciferol  5,000 Units Oral Daily  . darbepoetin (ARANESP) injection - DIALYSIS  200 mcg Intravenous Q Tue-HD  . doxazosin  2 mg Oral Daily  . enoxaparin (LOVENOX) injection  30 mg Subcutaneous Q24H  . gabapentin  300 mg Oral QHS  . multivitamin  1  tablet Oral QHS  . pantoprazole  40 mg Oral Daily  . polycarbophil  1,250 mg Oral BID  . saccharomyces boulardii  250 mg Oral BID  . ticagrelor  90 mg Oral BID    have reviewed scheduled and prn medications.  Physical Exam: General: adult male in NAD Heart: RRR; no rubs Lungs: clear and normal work of breathing Abdomen: soft/ND/NT Extremities: no lower extremity edema Dialysis Access: left upper AVF with bruit and thrill   06/25/2018,9:17 AM  LOS: 9 days

## 2018-06-25 NOTE — Progress Notes (Signed)
Speech Language Pathology Daily Session Note  Patient Details  Name: RAYLIN WINER Sr. MRN: [id number] Date of Birth: [birth year]  Today's Date: 06/25/2018 SLP Individual Time: 2831-5176 SLP Individual Time Calculation (min): 25 min  Short Term Goals: Week 2: SLP Short Term Goal 1 (Week 2): STG=LTG due to remaining length of stay  Skilled Therapeutic Interventions:  Pt was seen for skilled ST targeting communication goals.  SLP facilitated the session with a verbal description task to address use of intelligibility strategies.  Pt needed min cues to slow rate and increase vocal intensity to achieve intelligibility at the phrase/sentence level.  Pt was left in wheelchair with chair alarm set, call bell within reach, and wife at bedside.  Continue per current plan of care.    Pain Pain Assessment Pain Scale: 0-10 Pain Score: 0-No pain  Therapy/Group: Individual Therapy  Mali Eppard, Selinda Orion 06/25/2018, 12:24 PM

## 2018-06-26 ENCOUNTER — Inpatient Hospital Stay (HOSPITAL_COMMUNITY): Payer: Medicare Other | Admitting: Occupational Therapy

## 2018-06-26 LAB — CBC
HCT: 29.8 % — ABNORMAL LOW (ref 39.0–52.0)
Hemoglobin: 9.5 g/dL — ABNORMAL LOW (ref 13.0–17.0)
MCH: 29 pg (ref 26.0–34.0)
MCHC: 31.9 g/dL (ref 30.0–36.0)
MCV: 90.9 fL (ref 80.0–100.0)
Platelets: 256 10*3/uL (ref 150–400)
RBC: 3.28 MIL/uL — ABNORMAL LOW (ref 4.22–5.81)
RDW: 22.1 % — ABNORMAL HIGH (ref 11.5–15.5)
WBC: 11 10*3/uL — ABNORMAL HIGH (ref 4.0–10.5)
nRBC: 0 % (ref 0.0–0.2)

## 2018-06-26 LAB — RENAL FUNCTION PANEL
Albumin: 2.6 g/dL — ABNORMAL LOW (ref 3.5–5.0)
Anion gap: 12 (ref 5–15)
BUN: 25 mg/dL — ABNORMAL HIGH (ref 8–23)
CHLORIDE: 97 mmol/L — AB (ref 98–111)
CO2: 25 mmol/L (ref 22–32)
Calcium: 9.5 mg/dL (ref 8.9–10.3)
Creatinine, Ser: 4.89 mg/dL — ABNORMAL HIGH (ref 0.61–1.24)
GFR calc Af Amer: 13 mL/min — ABNORMAL LOW (ref >60)
GFR calc non Af Amer: 11 mL/min — ABNORMAL LOW (ref >60)
Glucose, Bld: 114 mg/dL — ABNORMAL HIGH (ref 70–99)
Phosphorus: 2.5 mg/dL (ref 2.5–4.6)
Potassium: 3.3 mmol/L — ABNORMAL LOW (ref 3.5–5.1)
Sodium: 134 mmol/L — ABNORMAL LOW (ref 135–145)

## 2018-06-26 MED ORDER — LOPERAMIDE HCL 2 MG PO CAPS: 2.0000 mg | ORAL_CAPSULE | Freq: Four times a day (QID) | ORAL | Status: DC | PRN

## 2018-06-26 NOTE — Progress Notes (Signed)
Occupational Therapy Session Note  Patient Details  Name: Jason LETIZIA Sr. MRN: [id number] Date of Birth: [birth year]  Today's Date: 06/26/2018 OT Individual Time: 5188-4166 OT Individual Time Calculation (min): 55 min    Short Term Goals: Week 2:  OT Short Term Goal 1 (Week 2): STG=LTG due to LOS  Skilled Therapeutic Interventions/Progress Updates:    Upon entering the room, pt supine in bed with wife present. Pt with c/o 6/10 back pain and declined OOB therapy this session. OT applied heat to pt's back and pt agreeable to L  UE NMR exercises with use of red resistive theraputty for strengthening and coordination with min verbal cuing for proper technique. Pt attempting finger isolation exercises and was able to improve with practice but able to isolate only 2nd and 3rd digit on command. Caregiver with several questions regarding stroke risk and symptoms. OT provided them with stroke notebook and reviewed this information with them for them to ask questions as needed. OT also recommended they attend stroke support group after discharge. Pt remained in supine per request for pain management. Call bell and all needed items within reach and bed alarm activated.   Therapy Documentation Precautions:  Precautions Precautions: Fall Restrictions Weight Bearing Restrictions: No Pain: Pain Assessment Pain Scale: 0-10 Pain Score: 5  Pain Type: Acute pain Pain Location: Back Pain Orientation: Lower Pain Descriptors / Indicators: Aching;Discomfort Pain Frequency: Constant Pain Onset: On-going Patients Stated Pain Goal: 3 Pain Intervention(s): Medication (See eMAR);Repositioned   Therapy/Group: Individual Therapy  Gypsy Decant 06/26/2018, 10:26 AM

## 2018-06-26 NOTE — Progress Notes (Signed)
Subjective:   No complaints this AM- awaiting HD - plan is still for discharge on Tuesday      Objective Vital signs in last 24 hours: Vitals:   06/25/18 0517 06/25/18 1419 06/25/18 1950 06/26/18 0530  BP: (!) 118/54 131/63 133/64 139/70  Pulse: 86 95 91 94  Resp: 14 16 18 18   Temp: 98 F (36.7 C) (!) 97.5 F (36.4 C) 99.5 F (37.5 C)   TempSrc: Oral  Oral   SpO2: 100% 99% 100% 100%  Weight: 60.5 kg     Height:       Weight change:   Intake/Output Summary (Last 24 hours) at 06/26/2018 1210 Last data filed at 06/26/2018 0844 Gross per 24 hour  Intake 517 ml  Output -  Net 517 ml    Assessment/ Plan: Pt is a 72 y.o. yo male with advanced CKD who was admitted on 06/16/2018 with a CVA- dialysis initiated this hospitalization    Assessment/Plan: 1. ESRD - initiated HD this hospitalization via AVF.  HD per TTS schedule with next treatment on 12/21.  - Patient has been given a TTS 12:40 pm spot at the [facility] outpatient unit.  Dry weight is around 60.5 - 61 kg   2. Access-some difficulty with cannulation earlier in hospitalization.  Dr. Donzetta Matters placed stent on 12/9.  Cannulation going well the past couple of treatments  3. Anemia- Slowly improving.  Iron deficient and on repletion.  On ESA (s/p recent dose increase to 200 mcg weekly).  S/p 1 unit PRBC's with HD on 12/19  4. Secondary hyperparathyroidism- PTH 164.  Has received daily calcitriol as inpatient - will transition to management at HD unit as an outpatient.  Phos acceptable without a binder  5. HTN/volume- acceptable on decreased dose of cardura.  6. Chronic systolic CHF (EF 16-10% per TTE this hospitalization) - UF with HD as tolerated.  For now holding lasix given report of dizziness and volume management with HD.  He may ultimately need lasix on non-HD days  7. Right leg nodule  - pt did not mention today   Dispo planning: patient states he is to be discharged on 12/24  [place]: Basic Metabolic Panel: Recent Labs  Lab 06/20/18 1751 06/22/18 1342 06/24/18 1323  NA 130* 133* 132*  K 3.4* 3.5 3.4*  CL 97* 96* 95*  CO2 19* 23 23  GLUCOSE 137* 144* 105*  BUN 64* 47* 28*  CREATININE 7.01* 5.94* 4.93*  CALCIUM 9.2 9.7 9.6  PHOS 2.7 3.5 2.2*   Liver Function Tests: Recent Labs  Lab 06/20/18 1751 06/22/18 1342 06/24/18 1323  ALBUMIN 2.6* 2.7* 2.9*   No results for input(s): LIPASE, AMYLASE in the last 168 hours. No results for input(s): AMMONIA in the last 168 hours. CBC: Recent Labs  Lab 06/20/18 1750 06/22/18 1342 06/24/18 0536  WBC 10.6* 13.3* 12.8*  HGB 7.3* 7.1* 7.5*  HCT 22.6* 23.1* 23.4*  MCV 89.0 91.7 91.1  PLT 260 257 237   Cardiac Enzymes: No results for input(s): CKTOTAL, CKMB, CKMBINDEX, TROPONINI in the last 168 hours. CBG: No results for input(s): GLUCAP in the last 168 hours.  Iron Studies: No results for input(s): IRON, TIBC, TRANSFERRIN, FERRITIN in the last 72 hours. Studies/Results: No results found. Medications: Infusions: . sodium chloride      Scheduled Medications: . sodium chloride   Intravenous Once  . allopurinol  100 mg Oral Daily  . aspirin  81 mg Oral  Daily  . atorvastatin  20 mg Oral q1800  . calcitRIOL  0.25 mcg Oral Daily  . chlorhexidine  15 mL Mouth Rinse BID  . Chlorhexidine Gluconate Cloth  6 each Topical Q0600  . Chlorhexidine Gluconate Cloth  6 each Topical Q0600  . cholecalciferol  5,000 Units Oral Daily  . darbepoetin (ARANESP) injection - DIALYSIS  200 mcg Intravenous Q Tue-HD  . doxazosin  2 mg Oral Daily  . enoxaparin (LOVENOX) injection  30 mg Subcutaneous Q24H  . gabapentin  300 mg Oral QHS  . multivitamin  1 tablet Oral QHS  . pantoprazole  40 mg Oral Daily  . polycarbophil  1,250 mg Oral BID  . saccharomyces boulardii  250 mg Oral BID  . ticagrelor  90 mg Oral BID    have reviewed scheduled and prn medications.  Physical Exam: General: adult male in  NAD Heart: RRR; no rubs Lungs: clear and normal work of breathing Abdomen: soft/ND/NT Extremities: no lower extremity edema Dialysis Access: left upper AVF with bruit and thrill   06/26/2018,12:10 PM  LOS: 10 days

## 2018-06-26 NOTE — Progress Notes (Addendum)
Jason KUSTER Sr. is a 72 y.o. male 14-Jan-1946 [id number]  Subjective: No new complaints, except for new loose stools (not new). No new problems. Slept well. Feeling OK.  Objective: Vital signs in last 24 hours: Temp:  [97.5 F (36.4 C)-99.5 F (37.5 C)] 99.5 F (37.5 C) (12/20 1950) Pulse Rate:  [91-95] 94 (12/21 0530) Resp:  [16-18] 18 (12/21 0530) BP: (131-139)/(63-70) 139/70 (12/21 0530) SpO2:  [99 %-100 %] 100 % (12/21 0530) Weight change:  Last BM Date: 06/24/18  Intake/Output from previous day: 12/20 0701 - 12/21 0700 In: 499 [P.O.:499] Out: -  Last cbgs: CBG (last 3)  No results for input(s): GLUCAP in the last 72 hours.   Physical Exam General: No apparent distress   HEENT: not dry Lungs: Normal effort. Lungs clear to auscultation, no crackles or wheezes. Cardiovascular: Regular rate and rhythm, no edema Abdomen: S/NT/ND; BS(+) Musculoskeletal:  unchanged Neurological: No new neurological deficits Wounds: n/a Skin: clear  Aging changes Mental state: Alert, oriented, cooperative    Lab Results: BMET    Component Value Date/Time   NA 132 (L) 06/24/2018 1323   NA 140 02/24/2017   NA 138 02/20/2014 1432   K 3.4 (L) 06/24/2018 1323   K 3.5 02/20/2014 1432   CL 95 (L) 06/24/2018 1323   CL 101 12/09/2012 1048   CO2 23 06/24/2018 1323   CO2 30 (H) 02/20/2014 1432   GLUCOSE 105 (H) 06/24/2018 1323   GLUCOSE 93 02/20/2014 1432   GLUCOSE 96 12/09/2012 1048   BUN 28 (H) 06/24/2018 1323   BUN 50 (A) 08/10/2017   BUN 36.0 (H) 02/20/2014 1432   CREATININE 4.93 (H) 06/24/2018 1323   CREATININE 2.4 (H) 02/20/2014 1432   CALCIUM 9.6 06/24/2018 1323   CALCIUM 10.4 02/20/2014 1432   GFRNONAA 11 (L) 06/24/2018 1323   GFRAA 13 (L) 06/24/2018 1323   CBC    Component Value Date/Time   WBC 12.8 (H) 06/24/2018 0536   RBC 2.57 (L) 06/24/2018 0536   HGB 7.5 (L) 06/24/2018 0536   HGB 10.9 (L) 03/17/2016 1546   HCT 23.4 (L) 06/24/2018 0536   HCT 35.4 (L)  03/17/2016 1546   PLT 237 06/24/2018 0536   PLT 209 03/17/2016 1546   MCV 91.1 06/24/2018 0536   MCV 87.9 03/17/2016 1546   MCH 29.2 06/24/2018 0536   MCHC 32.1 06/24/2018 0536   RDW 18.2 (H) 06/24/2018 0536   RDW 18.5 (H) 03/17/2016 1546   LYMPHSABS 0.3 (L) 06/09/2018 0454   LYMPHSABS 1.2 03/17/2016 1546   MONOABS 1.1 (H) 06/09/2018 0454   MONOABS 1.2 (H) 03/17/2016 1546   EOSABS 0.0 06/09/2018 0454   EOSABS 0.1 03/17/2016 1546   BASOSABS 0.0 06/09/2018 0454   BASOSABS 0.1 03/17/2016 1546    Studies/Results: No results found.  Medications: I have reviewed the patient's current medications.  Assessment/Plan:  1.  Status post right MCA infarct.  Continue CIR 2.  DVT prophylaxis with Lovenox 3.  Pain management with Tylenol PRN 4.  Emotional support 5.  Skin care with routine pressure relief measures 6.  End-stage renal disease stage V.  On hemodialysis Monday Wednesday Friday 7.  Anemia of chronic disease/renal disease he was started on Darboepoetin 8.  Hypertension.  Continue Cardura 9.  Neuropathy.  On gabapentin 10.  Obstructive sleep apnea on CPAP 11.  Hyperglycemia.  Monitoring sugars 12.  Leukocytosis - monitoring 13.  Urinary retention.  In and out catheter. 14.  Loose stools.  Imodium as  needed   Length of stay, days: [place] , MD 06/26/2018, 12:25 PM

## 2018-06-27 ENCOUNTER — Inpatient Hospital Stay (HOSPITAL_COMMUNITY): Payer: Medicare Other | Admitting: Physical Therapy

## 2018-06-27 MED ORDER — CHLORHEXIDINE GLUCONATE CLOTH 2 % EX PADS
6.0000 | MEDICATED_PAD | Freq: Every day | CUTANEOUS | Status: DC
  Administered 2018-06-29: 6 via TOPICAL

## 2018-06-27 NOTE — Progress Notes (Signed)
Pt placed on CPAP for the night at previous settings. tol well

## 2018-06-27 NOTE — Plan of Care (Signed)
  Problem: Consults Goal: RH STROKE PATIENT EDUCATION Description See Patient Education module for education specifics  Outcome: Progressing Goal: Nutrition Consult-if indicated Outcome: Progressing Goal: Diabetes Guidelines if Diabetic/Glucose > 140 Description If diabetic or lab glucose is > 140 mg/dl - Initiate Diabetes/Hyperglycemia Guidelines & Document Interventions  Outcome: Progressing   Problem: RH BOWEL ELIMINATION Goal: RH STG MANAGE BOWEL WITH ASSISTANCE Description STG Manage Bowel with Assistance. Mod I  Outcome: Progressing Goal: RH STG MANAGE BOWEL W/MEDICATION W/ASSISTANCE Description STG Manage Bowel with Medication with Assistance Mod I  Outcome: Progressing   Problem: RH SKIN INTEGRITY Goal: RH STG SKIN FREE OF INFECTION/BREAKDOWN Description Patient free from skin infection entire stay on rehab  Outcome: Progressing Goal: RH STG MAINTAIN SKIN INTEGRITY WITH ASSISTANCE Description STG Maintain Skin Integrity With Assistance.Mod I  Outcome: Progressing   Problem: RH SAFETY Goal: RH STG ADHERE TO SAFETY PRECAUTIONS W/ASSISTANCE/DEVICE Description STG Adhere to Safety Precautions With Assistance/Device. Mod I  Outcome: Progressing Goal: RH STG DECREASED RISK OF FALL WITH ASSISTANCE Description STG Decreased Risk of Fall With Assistance.Mod I  Outcome: Progressing   Problem: RH COGNITION-NURSING Goal: RH STG USES MEMORY AIDS/STRATEGIES W/ASSIST TO PROBLEM SOLVE Description STG Uses Memory Aids/Strategies With Assistance to Problem Solve. Mod  I  Outcome: Progressing Goal: RH STG ANTICIPATES NEEDS/CALLS FOR ASSIST W/ASSIST/CUES Description STG Anticipates Needs/Calls for Assist With Assistance/Cues. Mod I  Outcome: Progressing   Problem: RH PAIN MANAGEMENT Goal: RH STG PAIN MANAGED AT OR BELOW PT'S PAIN GOAL Description Pain less than 2  Outcome: Progressing   Problem: RH KNOWLEDGE DEFICIT Goal: RH STG INCREASE KNOWLEDGE OF  HYPERTENSION Description Increase knowledge  On hypertension mod I  Outcome: Progressing Goal: RH STG INCREASE KNOWLEGDE OF HYPERLIPIDEMIA Description Increase knowledge on hyperlipidemia mod I  Outcome: Progressing Goal: RH STG INCREASE KNOWLEDGE OF STROKE PROPHYLAXIS Description Increase knowledge on stroke prophylaxis mod I  Outcome: Progressing

## 2018-06-27 NOTE — Progress Notes (Signed)
Jason FEGER Sr. is a 72 y.o. male February 01, 1946 [id number]  Subjective: No new complaints. No new problems. Slept well. Feeling OK.  Objective: Vital signs in last 24 hours: Temp:  [97.9 F (36.6 C)-98.8 F (37.1 C)] 98.6 F (37 C) (12/22 1301) Pulse Rate:  [56-110] 88 (12/22 1301) Resp:  [16-18] 16 (12/22 1301) BP: (106-135)/(35-78) 118/62 (12/22 1301) SpO2:  [96 %-100 %] 100 % (12/22 1301) Weight:  [60.4 kg-61.1 kg] 60.4 kg (12/21 1607) Weight change:  Last BM Date: 06/26/18  Intake/Output from previous day: 12/21 0701 - 12/22 0700 In: 712 [P.O.:712] Out: 2400  Last cbgs: CBG (last 3)  No results for input(s): GLUCAP in the last 72 hours.   Physical Exam General: No apparent distress.  She is getting ready to eat breakfast HEENT: not dry Lungs: Normal effort. Lungs clear to auscultation, no crackles or wheezes. Cardiovascular: Regular rate and rhythm, no edema Abdomen: S/NT/ND; BS(+) Musculoskeletal:  unchanged Neurological: No new neurological deficits Wounds: N/A    Skin: clear  Aging changes Mental state: Alert, cooperative    Lab Results: BMET    Component Value Date/Time   NA 134 (L) 06/26/2018 1232   NA 140 02/24/2017   NA 138 02/20/2014 1432   K 3.3 (L) 06/26/2018 1232   K 3.5 02/20/2014 1432   CL 97 (L) 06/26/2018 1232   CL 101 12/09/2012 1048   CO2 25 06/26/2018 1232   CO2 30 (H) 02/20/2014 1432   GLUCOSE 114 (H) 06/26/2018 1232   GLUCOSE 93 02/20/2014 1432   GLUCOSE 96 12/09/2012 1048   BUN 25 (H) 06/26/2018 1232   BUN 50 (A) 08/10/2017   BUN 36.0 (H) 02/20/2014 1432   CREATININE 4.89 (H) 06/26/2018 1232   CREATININE 2.4 (H) 02/20/2014 1432   CALCIUM 9.5 06/26/2018 1232   CALCIUM 10.4 02/20/2014 1432   GFRNONAA 11 (L) 06/26/2018 1232   GFRAA 13 (L) 06/26/2018 1232   CBC    Component Value Date/Time   WBC 11.0 (H) 06/26/2018 1233   RBC 3.28 (L) 06/26/2018 1233   HGB 9.5 (L) 06/26/2018 1233   HGB 10.9 (L) 03/17/2016 1546   HCT 29.8  (L) 06/26/2018 1233   HCT 35.4 (L) 03/17/2016 1546   PLT 256 06/26/2018 1233   PLT 209 03/17/2016 1546   MCV 90.9 06/26/2018 1233   MCV 87.9 03/17/2016 1546   MCH 29.0 06/26/2018 1233   MCHC 31.9 06/26/2018 1233   RDW 22.1 (H) 06/26/2018 1233   RDW 18.5 (H) 03/17/2016 1546   LYMPHSABS 0.3 (L) 06/09/2018 0454   LYMPHSABS 1.2 03/17/2016 1546   MONOABS 1.1 (H) 06/09/2018 0454   MONOABS 1.2 (H) 03/17/2016 1546   EOSABS 0.0 06/09/2018 0454   EOSABS 0.1 03/17/2016 1546   BASOSABS 0.0 06/09/2018 0454   BASOSABS 0.1 03/17/2016 1546    Studies/Results: No results found.  Medications: I have reviewed the patient's current medications.  Assessment/Plan:  1.  Status post right MCA infarct.  Continue CIR 2.  DVT prophylaxis with Lovenox 3.  Pain management with PRN Tylenol 4.  Emotional support 5.  Skin care with routine pressure relief measures 6.  End-stage renal disease.  On hemodialysis Monday Wednesday Friday 7.  Anemia of chronic disease/renal disease.  He was started on Darboepoetin 8.  Hypertension.  Continue with Cardura 9.  Neuropathy.  On gabapentin 10.  Obstructive sleep apnea on CPAP 11.  Hyper glycemia.  We will continue to monitor glucose 12.  Leukocytosis.  Recheck next  week 13.  Urinary retention.  Using in and out catheter 14.  Loose stools.  Imodium as needed to continue     Length of stay, days: [place] , MD 06/27/2018, 1:50 PM

## 2018-06-27 NOTE — Progress Notes (Signed)
Subjective:   HD yest- removed 2400 tolerated well - plan is still for discharge on Tuesday    Objective Vital signs in last 24 hours: Vitals:   06/26/18 1600 06/26/18 1607 06/26/18 2011 06/27/18 0633  BP: 108/62 133/78 (!) 114/58 135/68  Pulse: (!) 110 95  77  Resp:  16 18 18   Temp:  97.9 F (36.6 C) 98.8 F (37.1 C) 98.1 F (36.7 C)  TempSrc:  Oral  Oral  SpO2:  96%  100%  Weight:  60.4 kg    Height:       Weight change:   Intake/Output Summary (Last 24 hours) at 06/27/2018 1135 Last data filed at 06/27/2018 2956 Gross per 24 hour  Intake 592 ml  Output 2400 ml  Net -1808 ml    Assessment/ Plan: Pt is a 72 y.o. yo male with advanced CKD who was admitted on 06/16/2018 with a CVA- dialysis initiated this hospitalization    Assessment/Plan: 1. ESRD - initiated HD this hospitalization via AVF.  HD per TTS schedule with next treatment on 12/23 for holiday schedule.  - Patient has been given a TTS 12:40 pm spot at the [facility] outpatient unit.  Dry weight is around 60.5 - 61 kg   2. Access-some difficulty with cannulation earlier in hospitalization.  Dr. Donzetta Matters placed stent on 12/9.  Cannulation going well the past couple of treatments  3. Anemia- Slowly improving.  Iron deficient and on repletion.  On ESA (s/p recent dose increase to 200 mcg weekly).  S/p 1 unit PRBC's with HD on 12/19  4. Secondary hyperparathyroidism- PTH 164.  Has received daily calcitriol as inpatient - will transition to management at HD unit as an outpatient.  Phos acceptable without a binder  5. HTN/volume- acceptable on decreased dose of cardura. He may not even need that - I will stop   6. Chronic systolic CHF (EF 21-30% per TTE this hospitalization) - UF with HD as tolerated. volume management with HD.  He may ultimately need lasix on non-HD days  7. Right leg nodule  - pt did not mention today   Dispo planning: patient states he is to be discharged on 12/24  [place]: Basic Metabolic Panel: Recent Labs  Lab 06/22/18 1342 06/24/18 1323 06/26/18 1232  NA 133* 132* 134*  K 3.5 3.4* 3.3*  CL 96* 95* 97*  CO2 23 23 25   GLUCOSE 144* 105* 114*  BUN 47* 28* 25*  CREATININE 5.94* 4.93* 4.89*  CALCIUM 9.7 9.6 9.5  PHOS 3.5 2.2* 2.5   Liver Function Tests: Recent Labs  Lab 06/22/18 1342 06/24/18 1323 06/26/18 1232  ALBUMIN 2.7* 2.9* 2.6*   No results for input(s): LIPASE, AMYLASE in the last 168 hours. No results for input(s): AMMONIA in the last 168 hours. CBC: Recent Labs  Lab 06/20/18 1750 06/22/18 1342 06/24/18 0536 06/26/18 1233  WBC 10.6* 13.3* 12.8* 11.0*  HGB 7.3* 7.1* 7.5* 9.5*  HCT 22.6* 23.1* 23.4* 29.8*  MCV 89.0 91.7 91.1 90.9  PLT 260 257 237 256   Cardiac Enzymes: No results for input(s): CKTOTAL, CKMB, CKMBINDEX, TROPONINI in the last 168 hours. CBG: No results for input(s): GLUCAP in the last 168 hours.  Iron Studies: No results for input(s): IRON, TIBC, TRANSFERRIN, FERRITIN in the last 72 hours. Studies/Results: No results found. Medications: Infusions: . sodium chloride      Scheduled Medications: . sodium chloride   Intravenous Once  . allopurinol  100  mg Oral Daily  . aspirin  81 mg Oral Daily  . atorvastatin  20 mg Oral q1800  . calcitRIOL  0.25 mcg Oral Daily  . chlorhexidine  15 mL Mouth Rinse BID  . Chlorhexidine Gluconate Cloth  6 each Topical Q0600  . Chlorhexidine Gluconate Cloth  6 each Topical Q0600  . cholecalciferol  5,000 Units Oral Daily  . darbepoetin (ARANESP) injection - DIALYSIS  200 mcg Intravenous Q Tue-HD  . doxazosin  2 mg Oral Daily  . enoxaparin (LOVENOX) injection  30 mg Subcutaneous Q24H  . gabapentin  300 mg Oral QHS  . multivitamin  1 tablet Oral QHS  . pantoprazole  40 mg Oral Daily  . polycarbophil  1,250 mg Oral BID  . saccharomyces boulardii  250 mg Oral BID  . ticagrelor  90 mg Oral BID    have reviewed scheduled and prn  medications.  Physical Exam: General: adult male in NAD Heart: RRR; no rubs Lungs: clear and normal work of breathing Abdomen: soft/ND/NT Extremities: no lower extremity edema Dialysis Access: left upper AVF with bruit and thrill   06/27/2018,11:35 AM  LOS: 11 days

## 2018-06-28 ENCOUNTER — Inpatient Hospital Stay (HOSPITAL_COMMUNITY): Payer: Medicare Other | Admitting: Occupational Therapy

## 2018-06-28 ENCOUNTER — Inpatient Hospital Stay (HOSPITAL_COMMUNITY): Payer: Medicare Other | Admitting: Speech Pathology

## 2018-06-28 ENCOUNTER — Inpatient Hospital Stay (HOSPITAL_COMMUNITY): Payer: Medicare Other | Admitting: Physical Therapy

## 2018-06-28 LAB — CBC
HCT: 30.2 % — ABNORMAL LOW (ref 39.0–52.0)
Hemoglobin: 9.5 g/dL — ABNORMAL LOW (ref 13.0–17.0)
MCH: 28.3 pg (ref 26.0–34.0)
MCHC: 31.5 g/dL (ref 30.0–36.0)
MCV: 89.9 fL (ref 80.0–100.0)
Platelets: 167 10*3/uL (ref 150–400)
RBC: 3.36 MIL/uL — ABNORMAL LOW (ref 4.22–5.81)
RDW: 21.6 % — ABNORMAL HIGH (ref 11.5–15.5)
WBC: 2.5 10*3/uL — ABNORMAL LOW (ref 4.0–10.5)
nRBC: 0 % (ref 0.0–0.2)

## 2018-06-28 LAB — RENAL FUNCTION PANEL
ALBUMIN: 2.7 g/dL — AB (ref 3.5–5.0)
Anion gap: 10 (ref 5–15)
BUN: 27 mg/dL — ABNORMAL HIGH (ref 8–23)
CO2: 26 mmol/L (ref 22–32)
Calcium: 9.6 mg/dL (ref 8.9–10.3)
Chloride: 103 mmol/L (ref 98–111)
Creatinine, Ser: 4.27 mg/dL — ABNORMAL HIGH (ref 0.61–1.24)
GFR calc Af Amer: 15 mL/min — ABNORMAL LOW (ref >60)
GFR, EST NON AFRICAN AMERICAN: 13 mL/min — AB (ref >60)
Glucose, Bld: 101 mg/dL — ABNORMAL HIGH (ref 70–99)
PHOSPHORUS: 2.1 mg/dL — AB (ref 2.5–4.6)
Potassium: 3.7 mmol/L (ref 3.5–5.1)
Sodium: 139 mmol/L (ref 135–145)

## 2018-06-28 MED ORDER — DARBEPOETIN ALFA 200 MCG/0.4ML IJ SOSY
PREFILLED_SYRINGE | INTRAMUSCULAR | Status: AC
  Filled 2018-06-28: qty 0.4

## 2018-06-28 NOTE — Progress Notes (Signed)
Social Work Patient ID: Jason Cotta Sr., male   DOB: July 08, 1945, 72 y.o.   MRN: [id number] Per HD will dialysis today and have him go to OP HD tomorrow for his new time slot of 12:40. Wife is aware of this plan.

## 2018-06-28 NOTE — Progress Notes (Signed)
Placed patient on CPAP for the night via auto-mode with minimum pressure set at 5cm and maximum pressure set at 20cm  

## 2018-06-28 NOTE — Progress Notes (Signed)
Speech Language Pathology Discharge Summary  Patient Details  Name: Jason CHRISTOFFEL Sr. MRN: [id number] Date of Birth: 1946-04-06  Today's Date: 06/28/2018 SLP Individual Time: 1200-1245 SLP Individual Time Calculation (min): 45 min   Skilled Therapeutic Interventions:  Skilled treatment session focused on completing education with pt and his wife on current supervision level of care targeting dysarthria and cognitive deficits including attention, semi-complex problem and anticipatory awareness. All questions answered to pt and wife's satisfaction. Questions arose regarding HD schedule and chart reviewed with questions directed to CSW. CSW aware and targeting HD schedule and questions.   Patient has met 4 of 4 long term goals.  Patient to discharge at overall Supervision level.    Clinical Impression/Discharge Summary:   Pt has made progress in skilled ST and as a result he has met 4 out of 4 LTGs and is discharging at a supervision level. Education has been provided to wife on supervision level of care that pt requires for attention and problem solving. Would recommend HHST to target dysarthria and anticipatory awareness within home setting. All education has been completed.    Care Partner:  Caregiver Able to Provide Assistance: Yes  Type of Caregiver Assistance: Physical;Cognitive  Recommendation:  24 hour supervision/assistance;Home Health SLP  Rationale for SLP Follow Up: Maximize functional communication;Maximize cognitive function and independence;Reduce caregiver burden;Maximize swallowing safety   Equipment:     Reasons for discharge: Discharged from hospital;Treatment goals met   Patient/Family Agrees with Progress Made and Goals Achieved: Yes    Jason [name] 06/28/2018, 1:04 PM

## 2018-06-28 NOTE — Discharge Summary (Addendum)
Physician Discharge Summary  Patient ID: Jason NAY Sr. MRN: [id number] DOB/AGE: December 11, 1945 72 y.o.  Admit date: 06/16/2018 Discharge date: 06/29/2018  Discharge Diagnoses:  Principal Problem:   Stroke due to embolism [facility]) Active Problems:   ESRD on dialysis ([place])   Leukocytosis   Hyperglycemia   Anemia of chronic disease   Urinary retention   Benign essential HTN   Thrombocytopenia (HCC)   Discharged Condition:  Stable   Significant Diagnostic Studies: N/A   Labs:  Basic Metabolic Panel: BMP Latest Ref Rng & Units 06/28/2018 06/26/2018 06/24/2018  Glucose 70 - 99 mg/dL 101(H) 114(H) 105(H)  BUN 8 - 23 mg/dL 27(H) 25(H) 28(H)  Creatinine 0.61 - 1.24 mg/dL 4.27(H) 4.89(H) 4.93(H)  Sodium 135 - 145 mmol/L 139 134(L) 132(L)  Potassium 3.5 - 5.1 mmol/L 3.7 3.3(L) 3.4(L)  Chloride 98 - 111 mmol/L 103 97(L) 95(L)  CO2 22 - 32 mmol/L 26 25 23   Calcium 8.9 - 10.3 mg/dL 9.6 9.5 9.6    CBC: Recent Labs  Lab 06/26/18 1233 06/28/18 1339 06/29/18 0927  WBC 11.0* 2.5* 9.2  HGB 9.5* 9.5* 10.3*  HCT 29.8* 30.2* 33.7*  MCV 90.9 89.9 91.1  PLT 256 167 134*    CBG: No results for input(s): GLUCAP in the last 168 hours.  Brief HPI:   Jason [name] is a 72 year old male with history of HTN, rectal cancer, neuropathy, severe pulmonary hypertension, CKD s/p aVF, OSA; who was admitted on 06/08/2018 with left-sided weakness and severely dysarthric speech.  CT/perfusion study of cerebrum showed occluded right ICA, right ICA terminus and MCA, occluded right vertebral artery and soft plaque with thrombus in left vertebral artery at origin.  He underwent cerebral angiogram with rescue stent and placed on aspirin Brilinta postprocedure.  Stroke felt to be due to large vessel atherosclerosis and 30-day cardiac event monitor recommended to rule out A. fib as cause.   Hospital course significant for issues with persistent hyperkalemia requiring initiation of hemodialysis.  Due to difficulty  cannulating AV fistula, patient required fistulogram with stent placement of left cephalic arch stenosis.  He continued to be limited by left-sided weakness with dysarthria as well as delayed processing with poor awareness of deficits.  CIR recommended due to functional decline   Hospital Course: Jason PIASCIK Sr. was admitted to rehab 06/16/2018 for inpatient therapies to consist of PT, ST and OT at least three hours five days a week. Past admission physiatrist, therapy team and rehab RN have worked together to provide customized collaborative inpatient rehab.  Mentation has steadily improved and lethargy has resolved.  Neuropathy has been managed with use of gabapentin at bedtime.  He has been compliant with CPAP use.Diabetes has been monitored with AC/HS CBG checks.  Sliding scale insulin was used for elevated blood sugars.  P.o. intake has been good.  Serial CBC showed leukocytosis which has resolved.  No signs of infection noted.  No signs of bleeding noted and platelets were monitored with serial checks during the stay.  CBC at discharge revealed thrombocytopenia and recommend repeat CBC in the next 2 to 3 days for monitoring/stability is on aspirin and Brilinta.   Anemia of chronic disease has been monitored with routine checks and he was started on Aranesp with supplementation on 12/17.  He received 1 unit packed red blood cells on 12/19 due to downward trend in hemoglobin to 7.5.  He reported issues with bladder discomfort and has been cath on intermittent basis initially.  Nephrology has been  following for management of dialysis which has been narrowed to TTS.  Last dialysis on 1223 with postdialysis weight 57.8 kg.  Patient and wife have been educated on renal diet as well as limiting fluid intake to 5 cups daily.  He has progressed to supervision level and will continue to receive further follow-up home health PT/OT/speech therapy and RN by [facility] past discharge   Rehab course: During  patient's stay in rehab weekly team conferences were held to monitor patient's progress, set goals and discuss barriers to discharge. At admission, patient required min assist with basic self-care tasks and mobility.  He exhibited mild dysarthria as well as moderate cognitive deficits affecting memory, problem-solving and attention. He  has had improvement in activity tolerance, balance, postural control as well as ability to compensate for deficits.He is able to complete ADL tasks with supervision.  He requires supervision for transfers and is able to ambulate 175 feet with rolling walker and supervision.  Fatigue continues to be a limiting factor.He requires supervision for attention, utilization of speech strategies for dysarthria, anticipatory awareness and semi-complex problem-solving.Family education has been completed regarding all aspects of safety, cognition and mobility.    Disposition: Home   Diet: Carb modified/Renal diet- 1200 cc FR/day.   Special Instructions: 1. Recheck CBC with next HD session to evaluate platelets.  2. Needs supervision with mobility and assistance with medication management.  3. Limit fluids to 5 cups daily.  4. Needs referral to cardiology for 30 day event monitor.    Discharge Instructions    Ambulatory referral to Physical Medicine Rehab   Complete by:  As directed    1-2 weeks transitional care appt/Needs MWF     Allergies as of 06/29/2018      Reactions   Nsaids Other (See Comments)   Asked by surgeon to add this medication class as intolerance due to patients renal insufficiency.   Lactose Intolerance (gi)    Diarrhea    Amlodipine Other (See Comments)   Edema      Medication List    STOP taking these medications   alteplase 2 MG injection Commonly known as:  CATHFLO ACTIVASE   calcium carbonate 1250 (500 Ca) MG tablet Commonly known as:  OS-CAL - dosed in mg of elemental calcium   Darbepoetin Alfa 100 MCG/0.5ML Sosy injection Commonly  known as:  ARANESP   doxazosin 8 MG tablet Commonly known as:  CARDURA   feeding supplement (ENSURE ENLIVE) Liqd   heparin 1000 unit/mL Soln injection   heparin 5000 UNIT/ML injection   lidocaine (PF) 1 % Soln injection Commonly known as:  XYLOCAINE   lidocaine-prilocaine cream Commonly known as:  EMLA   pentafluoroprop-tetrafluoroeth Aero Commonly known as:  GEBAUERS   senna-docusate 8.6-50 MG tablet Commonly known as:  Senokot-S     TAKE these medications   acetaminophen 325 MG tablet Commonly known as:  TYLENOL Take 1-2 tablets (325-650 mg total) by mouth every 4 (four) hours as needed for mild pain.   allopurinol 100 MG tablet Commonly known as:  ZYLOPRIM Take 1 tablet (100 mg total) by mouth daily.   aspirin 81 MG chewable tablet Chew 1 tablet (81 mg total) by mouth daily.   atorvastatin 20 MG tablet Commonly known as:  LIPITOR Take 1 tablet (20 mg total) by mouth daily at 6 PM.   calcitRIOL 0.25 MCG capsule Commonly known as:  ROCALTROL Take 1 capsule (0.25 mcg total) by mouth daily.   cholecalciferol 1000 units tablet Commonly  known as:  VITAMIN D Take 5,000 Units by mouth daily.   gabapentin 300 MG capsule Commonly known as:  NEURONTIN Take 1 capsule (300 mg total) by mouth at bedtime. What changed:  when to take this   multivitamin Tabs tablet Take 1 tablet by mouth at bedtime.   pantoprazole 40 MG tablet Commonly known as:  PROTONIX Take 1 tablet (40 mg total) by mouth daily.   polycarbophil 625 MG tablet Commonly known as:  FIBERCON Take 2 tablets (1,250 mg total) by mouth 2 (two) times daily. Notes to patient:  For loose stools/bulking/is OTC   saccharomyces boulardii 250 MG capsule Commonly known as:  FLORASTOR Take 1 capsule (250 mg total) by mouth 2 (two) times daily. Notes to patient:  Probiotic for bulking   ticagrelor 90 MG Tabs tablet Commonly known as:  BRILINTA Take 1 tablet (90 mg total) by mouth 2 (two) times daily.    traMADol 50 MG tablet--Rx# 15 pills. Commonly known as:  ULTRAM Take 1 tablet (50 mg total) by mouth every 12 (twelve) hours as needed for moderate pain.      Follow-up Information    [name], MD Follow up on 07/14/2018.   Specialty:  Internal Medicine Why:  APPOINTMENT @ 1:30 pm, ARRIVE BY 1;15 PM Contact information: [address] [place] [address] [phone]        Jamse Arn, MD Follow up.   Specialty:  Physical Medicine and Rehabilitation Why:  office will call you with follow up appoitment Contact information: [place] [address] [phone]        [facility]. Call in 2 day(s).   Specialty:  Neurology Why:  for follow up appointment/stroke follow up Contact information: [address] [place] [phone]       Luanne Bras, MD. Call in 2 day(s).   Specialties:  Interventional Radiology, Radiology Why:  for follow up appointment/stent Contact information: [place] [address] [phone]           Signed: Bary Leriche 06/29/2018, 1:09 PM   Patient seen and examined by me on day of discharge. Delice Lesch, MD, ABPMR

## 2018-06-28 NOTE — Progress Notes (Signed)
Social Work  Discharge Note  The overall goal for the admission was met for:   Discharge location: Yes-HOME WITH WIFE WHO CAN PROVIDE SUPERVISION LEVEL  Length of Stay: Yes-13 DAYS  Discharge activity level: Yes-SUPERVISION LEVEL  Home/community participation: Yes  Services provided included: MD, RD, PT, OT, SLP, RN, CM, Pharmacy and [place]: Private Insurance: [facility]  Follow-up services arranged: Home Health: [place] CARE-PT,OT,SP,RN, DME: [facility]-TRANSPORT [facility]AIR, [place] and Patient/Family has no preference for HH/DME agencies  Comments (or additional information):WIFE [place]. NEW HD PATIENT T, TH SAT AT [facility] [place] HD THE 11;40 SLOT  Patient/Family verbalized understanding of follow-up arrangements: Yes  Individual responsible for coordination of the follow-up plan: PHYLLIS-WIFE  Confirmed correct DME delivered: Elease Hashimoto 06/28/2018    Stephanie Mcglone, [place]

## 2018-06-28 NOTE — Progress Notes (Signed)
Occupational Therapy Session Note  Patient Details  Name: Jason GIDNEY Sr. MRN: [id number] Date of Birth: [birth year]  Today's Date: 06/28/2018 OT Individual Time: 0900-1000 OT Individual Time Calculation (min): 60 min    Short Term Goals: Week 2:  OT Short Term Goal 1 (Week 2): STG=LTG due to LOS  Skilled Therapeutic Interventions/Progress Updates:    Pt seen for OT ADL bathing/dressing session. Pt sitting up in w/c upon arrival, complaints of "generalized soreness" in back, willing to cont with therapy without intervention. He ambulated throughout session with RW and supervision, min cuing required throughout for Rw management and safety during functional tasks. He completed toileting task with supervision, standing at RW to complete clothing management and hygiene. Increase in blood noted during urination, RN made aware. He transitioned to shower and bathed seated on tub transfer bench. He stood with use of grab bar to complete pericare/buttock hygiene, alternating UE support on grab bars and at times able to maintain dynamic balance without UE support. Bathing task completed at overall supervision level. He returned to w/c to dress, completed with supervision only or LB dressing and independent for UB dressing.  He ambulated within unit using RW with supervision. In therapy gym, completed table top fine motor coordination activity. Used L UE to place and remove small pegs from board. Required mod cuing for recall of directions of activity/game. Able to use L UE at non-dominant level independently. Pt ambulated back to room at end of session, left seated in w/c with all needs in reach, chair belt alarm donned.   Therapy Documentation Precautions:  Precautions Precautions: Fall Restrictions Weight Bearing Restrictions: No    Therapy/Group: Individual Therapy  Alastor Kneale L 06/28/2018, 6:33 AM

## 2018-06-28 NOTE — Progress Notes (Signed)
[facility] ROUNDING NOTE   Subjective:   No complaints this morning appears to be doing well  Blood pressure 130/56 pulse 98 temperature 99.1 O2 sats 100% room air  Dialysis 06/26/2018 2.4 L removed  Sodium 134 potassium 3.3 chloride 97 CO2 25 BUN 25 creatinine 4.89 glucose 114 calcium 9.5 phosphorus 2.5 albumin 2.6 WBC 11.0 hemoglobin 9.5 platelets 256   Objective:  Vital signs in last 24 hours:  Temp:  [98.6 F (37 C)-99.1 F (37.3 C)] 99.1 F (37.3 C) (12/23 0356) Pulse Rate:  [88-98] 98 (12/23 0356) Resp:  [16-18] 18 (12/23 0356) BP: (118-130)/(56-65) 130/56 (12/23 0356) SpO2:  [100 %] 100 % (12/23 0356) Weight:  [59.7 kg] 59.7 kg (12/23 0356)  Weight change: -3.7 kg Filed Weights   06/26/18 1517 06/26/18 1607 06/28/18 0356  Weight: 61.1 kg 60.4 kg 59.7 kg    Intake/Output: I/O last 3 completed shifts: In: 342 [P.O.:342] Out: 700 [Urine:700]   Intake/Output this shift:  Total I/O In: 180 [P.O.:180] Out: -   CVS- RRR faint systolic murmur 3/6  RS- CTA no wheezes or rales ABD- BS present soft non-distended EXT- no edema   Basic Metabolic Panel: Recent Labs  Lab 06/22/18 1342 06/24/18 1323 06/26/18 1232  NA 133* 132* 134*  K 3.5 3.4* 3.3*  CL 96* 95* 97*  CO2 23 23 25   GLUCOSE 144* 105* 114*  BUN 47* 28* 25*  CREATININE 5.94* 4.93* 4.89*  CALCIUM 9.7 9.6 9.5  PHOS 3.5 2.2* 2.5    Liver Function Tests: Recent Labs  Lab 06/22/18 1342 06/24/18 1323 06/26/18 1232  ALBUMIN 2.7* 2.9* 2.6*   No results for input(s): LIPASE, AMYLASE in the last 168 hours. No results for input(s): AMMONIA in the last 168 hours.  CBC: Recent Labs  Lab 06/22/18 1342 06/24/18 0536 06/26/18 1233  WBC 13.3* 12.8* 11.0*  HGB 7.1* 7.5* 9.5*  HCT 23.1* 23.4* 29.8*  MCV 91.7 91.1 90.9  PLT 257 237 256    Cardiac Enzymes: No results for input(s): CKTOTAL, CKMB, CKMBINDEX, TROPONINI in the last 168 hours.  BNP: Invalid input(s):  POCBNP  CBG: No results for input(s): GLUCAP in the last 168 hours.  Microbiology: Results for orders placed or performed during the hospital encounter of 06/08/18  MRSA PCR Screening     Status: None   Collection Time: 06/08/18  9:46 PM  Result Value Ref Range Status   MRSA by PCR NEGATIVE NEGATIVE Final    Comment:        The GeneXpert MRSA Assay (FDA approved for NASAL specimens only), is one component of a comprehensive MRSA colonization surveillance program. It is not intended to diagnose MRSA infection nor to guide or monitor treatment for MRSA infections. Performed at [facility], [place] [address], [place], [place] [address]     Coagulation Studies: No results for input(s): LABPROT, INR in the last 72 hours.  Urinalysis: No results for input(s): COLORURINE, LABSPEC, PHURINE, GLUCOSEU, HGBUR, BILIRUBINUR, KETONESUR, PROTEINUR, UROBILINOGEN, NITRITE, LEUKOCYTESUR in the last 72 hours.  Invalid input(s): APPERANCEUR    Imaging: No results found.   Medications:   . sodium chloride     . sodium chloride   Intravenous Once  . allopurinol  100 mg Oral Daily  . aspirin  81 mg Oral Daily  . atorvastatin  20 mg Oral q1800  . calcitRIOL  0.25 mcg Oral Daily  . chlorhexidine  15 mL Mouth Rinse BID  . Chlorhexidine Gluconate Cloth  6  each Topical [id number]  . cholecalciferol  5,000 Units Oral Daily  . darbepoetin (ARANESP) injection - DIALYSIS  200 mcg Intravenous Q Tue-HD  . enoxaparin (LOVENOX) injection  30 mg Subcutaneous Q24H  . gabapentin  300 mg Oral QHS  . multivitamin  1 tablet Oral QHS  . pantoprazole  40 mg Oral Daily  . polycarbophil  1,250 mg Oral BID  . saccharomyces boulardii  250 mg Oral BID  . ticagrelor  90 mg Oral BID   sodium chloride, acetaminophen, aluminum hydroxide, bisacodyl, diphenhydrAMINE, guaiFENesin-dextromethorphan, heparin, lidocaine (PF), lidocaine-prilocaine, loperamide, pentafluoroprop-tetrafluoroeth, polyethylene glycol,  prochlorperazine **OR** prochlorperazine **OR** prochlorperazine, traMADol, traZODone  Assessment/ Plan:   Status post right MCA infarct 10 years on CIR  End-stage renal disease appears to have had last dialysis treatment 06/26/2018 no dialysis over the weekend.  We will plan dialysis today he therefore has a slot TTS to be started [facility] 06/29/2018  Hypertension appears adequately controlled Cardura appears to be the only medication at this point  Anemia stable darbepoetin 06/22/2018 200 mcg status post 1 unit packed red blood cells 06/24/2018  Secondary hyperparathyroidism calcitriol 0.25 mcg daily.  Phosphorus acceptable without binder  Chronic systolic heart failure EF 20 to 25% on TEE this administration.  Access some difficulty cannulation stent placed 06/2018.  Appears to be functioning well  LOS: [place] @TODAY @9 :21 AM

## 2018-06-28 NOTE — Progress Notes (Addendum)
Physical Therapy Discharge Summary  Patient Details  Name: Jason [name]. MRN: [id number] Date of Birth: Jun 27, 1946  Today's Date: 06/28/2018 PT Individual Time: 1115-1200 PT Individual Time Calculation (min): 45 min    Patient has met 11 of 11 long term goals due to improved activity tolerance, improved balance, improved postural control, increased strength, ability to compensate for deficits, functional use of  left upper extremity and left lower extremity and improved coordination.  Patient to discharge at an ambulatory level Supervision.   Patient's care partner is independent to provide the necessary physical and cognitive assistance at discharge.  Reasons goals not met: All goals met  Recommendation:  Patient will benefit from ongoing skilled PT services in outpatient setting to continue to advance safe functional mobility, address ongoing impairments in strength, balance, activity tolerance, and minimize fall risk.  Equipment: RW  Reasons for discharge: treatment goals met and discharge from hospital  Patient/family agrees with progress made and goals achieved: Yes  PT Discharge Precautions/Restrictions Precautions Precautions: Fall Restrictions Weight Bearing Restrictions: No Vital Signs Therapy Vitals Temp: 99.1 F (37.3 C) Temp Source: Oral Pulse Rate: 98 Resp: 18 BP: (!) 130/56 Patient Position (if appropriate): Lying Oxygen Therapy SpO2: 100 % O2 Device: Room Air Vision/Perception  Perception Perception: Impaired Comments: Min. L inattention Praxis Praxis: Intact  Cognition Overall Cognitive Status: Impaired/Different from baseline Arousal/Alertness: Awake/alert Orientation Level: Oriented X4 Selective Attention: Appears intact Memory: Impaired Memory Impairment: Decreased recall of new information Awareness: Impaired Awareness Impairment: Anticipatory impairment Problem Solving: Impaired Problem Solving Impairment: Verbal complex;Functional  complex Safety/Judgment: Impaired Comments: Slightly impulsive with decreased awareness of deficits and functional implications Sensation Sensation Light Touch: Appears Intact Proprioception: Appears Intact Coordination Gross Motor Movements are Fluid and Coordinated: No Fine Motor Movements are Fluid and Coordinated: No Coordination and Movement Description: Min. L hemiplegia 9 Hole Peg Test: R: 43.03, 43.3and 39.33 and L 46.63, 48.02, and 48.57 Motor  Motor Motor: Ataxia;Hemiplegia  Mobility Bed Mobility Bed Mobility: Sit to Supine;Supine to Sit Supine to Sit: Independent Sit to Supine: Independent Transfers Transfers: Sit to [employer] Transfers Sit to Stand: Independent Stand Pivot Transfers: Independent Transfer (Assistive device): Rolling walker Locomotion  Gait Ambulation: Yes Gait Assistance: Supervision/Verbal cueing Gait Distance (Feet): 175 Feet Assistive device: Rolling walker Gait Assistance Details: Verbal cues for precautions/safety Gait Gait: Yes Gait Pattern: Impaired Gait Pattern: Poor foot clearance - left Gait velocity: 2.4 ft/sec Stairs / Additional Locomotion Stairs: Yes Stairs Assistance: Supervision/Verbal cueing Stair Management Technique: Alternating pattern;Forwards;Two rails Number of Stairs: 12 Height of Stairs: 6 Ramp: Supervision/Verbal cueing Curb: Supervision/Verbal cueing Wheelchair Mobility Wheelchair Mobility: No(pt ambulatory)  Trunk/Postural Assessment  Cervical Assessment Cervical Assessment: Within Functional Limits Thoracic Assessment Thoracic Assessment: Within Functional Limits Lumbar Assessment Lumbar Assessment: Within Functional Limits Postural Control Postural Control: Within Functional Limits  Balance Balance Balance Assessed: Yes Standardized Balance Assessment Standardized Balance Assessment: Timed Up and Go Test Timed Up and Go Test TUG: Normal TUG Normal TUG (seconds): 24(RW) Static Sitting  Balance Static Sitting - Balance Support: No upper extremity supported;Feet supported Static Sitting - Level of Assistance: 6: Modified independent (Device/Increase time) Dynamic Sitting Balance Dynamic Sitting - Balance Support: No upper extremity supported;Feet supported;During functional activity Dynamic Sitting - Level of Assistance: 6: Modified independent (Device/Increase time) Sitting balance - Comments: Sitting to complete bathing/dressing tasks Static Standing Balance Static Standing - Balance Support: During functional activity;No upper extremity supported Static Standing - Level of Assistance: 6: Modified independent (Device/Increase time) Dynamic Standing Balance  Dynamic Standing - Balance Support: During functional activity Dynamic Standing - Level of Assistance: 5: Stand by assistance Extremity Assessment  RUE Assessment RUE Assessment: Within Functional Limits   RLE Assessment RLE Assessment: Within Functional Limits Passive Range of Motion (PROM) Comments: WFL Active Range of Motion (AROM) Comments: grossly 4+/5 throughout LLE Assessment LLE Assessment: Within Functional Limits Passive Range of Motion (PROM) Comments: WFL Active Range of Motion (AROM) Comments: grossly 4/5 throughout  Skilled Therapeutic Intervention: Pt received seated in w/c, denies pain and agreeable to treatment. Assessed mobility as above with S overall using RW, wife providing S throughout session. Reviewed recommendations for 24/7 S and use of RW at d/c. Pt performed floor transfer with S; educated pt and wife on fall safety. Pt's wife provided assist for toileting. Pt and wife with no further questions or concerns regarding d/c home at this time. Remained in w/c at end of session, all needs in reach.   Benjiman Core [facility] 06/28/2018, 7:43 AM

## 2018-06-28 NOTE — Progress Notes (Signed)
Physical Therapy Session Note  Patient Details  Name: Jason KIRSH Sr. MRN: [id number] Date of Birth: August 26, 1945  Today's Date: 06/28/2018 PT Individual Time: 0830-0900 PT Individual Time Calculation (min): 30 min   Short Term Goals: Week 2:  PT Short Term Goal 1 (Week 2): =LTG due to estimated LOS  Skilled Therapeutic Interventions/Progress Updates:    Patient received in bed, pleasant and willing to participate in PT session this morning. Independent in bed mobility, and required only S with RW for safe functional transfers and ambulation in room to bathroom, able to toilet with distant S but required totalA for pericare. Able to assist in threading feet into new brief and pulled up pants without B UE support and min guard for safety, then participated in dynamic reaching activities seated in WC at sink with S for all activities. Patient assisted in applying TEDs and hospital socks, and then was able to tolerate ambulation approximately 143f with RW and S before stopping due to fatigue. [facility] and was left up in his WC with seat belt alarm active, all needs otherwise met this morning.   Therapy Documentation Precautions:  Precautions Precautions: Fall Restrictions Weight Bearing Restrictions: No General:   Vital Signs:  Pain: Pain Assessment Pain Scale: 0-10 Pain Score: 5  Pain Location: Back Pain Orientation: Lower Pain Descriptors / Indicators: Aching;Sore Pain Onset: On-going Patients Stated Pain Goal: 0 Pain Intervention(s): Ambulation/increased activity;RN made aware Multiple Pain Sites: No      Therapy/Group: Individual Therapy   KDeniece ReePT, DPT, CBIS  Supplemental Physical Therapist [facility]   Pager [phone] Rehab Office [phone]   06/28/2018, 10:02 AM

## 2018-06-28 NOTE — Progress Notes (Signed)
[place] PHYSICAL MEDICINE & REHABILITATION PROGRESS NOTE   Subjective/Complaints: Patient seen sitting up in his chair this morning.  He states he slept well overnight.  He states he is looking forward to discharge tomorrow.  ROS: Denies CP, SOB, N/V/D   Objective:   No results found. Recent Labs    06/26/18 1233  WBC 11.0*  HGB 9.5*  HCT 29.8*  PLT 256   Recent Labs    06/26/18 1232  NA 134*  K 3.3*  CL 97*  CO2 25  GLUCOSE 114*  BUN 25*  CREATININE 4.89*  CALCIUM 9.5    Intake/Output Summary (Last 24 hours) at 06/28/2018 9892 Last data filed at 06/28/2018 0900 Gross per 24 hour  Intake 402 ml  Output 700 ml  Net -298 ml     Physical Exam: Vital Signs Blood pressure (!) 130/56, pulse 98, temperature 99.1 F (37.3 C), temperature source Oral, resp. rate 18, height 5\' 8"  (1.727 m), weight 59.7 kg, SpO2 100 %.  Constitutional: No distress . Vital signs reviewed. HENT: Normocephalic.  Atraumatic. Eyes: EOMI. No discharge. Cardiovascular: RRR.  No JVD. Respiratory: CTA bilaterally.  Normal effort. GI: BS +. Non-distended. Musc: No edema or tenderness in extremities. Neuro: Alert  Motor: 5/5 prox to distal RUE/RLE, stable 4+/5 LUE/LLE, improving Skin: Clean and intact without signs of breakdown, AVG LUE Psych: Normal mood.  Normal behavior.  Assessment/Plan: 1. Functional deficits secondary to RIght MCA infarct which require 3+ hours per day of interdisciplinary therapy in a comprehensive inpatient rehab setting.  Physiatrist is providing close team supervision and 24 hour management of active medical problems listed below.  Physiatrist and rehab team continue to assess barriers to discharge/monitor patient progress toward functional and medical goals  Care Tool:  Bathing    Body parts bathed by patient: Right arm, Left arm, Chest, Abdomen, Front perineal area, Buttocks, Right upper leg, Left upper leg, Right lower leg, Left lower leg, Face         Bathing assist Assist Level: Supervision/Verbal cueing     Upper Body Dressing/Undressing Upper body dressing   What is the patient wearing?: Pull over shirt    Upper body assist Assist Level: Set up assist    Lower Body Dressing/Undressing Lower body dressing      What is the patient wearing?: Pants, Underwear/pull up     Lower body assist Assist for lower body dressing: Supervision/Verbal cueing     Toileting Toileting    Toileting assist Assist for toileting: Supervision/Verbal cueing     Transfers Chair/bed transfer  Transfers assist     Chair/bed transfer assist level: Supervision/Verbal cueing     Locomotion Ambulation   Ambulation assist      Assist level: Supervision/Verbal cueing Assistive device: Walker-rolling Max distance: 174ft    Walk 10 feet activity   Assist     Assist level: Supervision/Verbal cueing Assistive device: Walker-rolling   Walk 50 feet activity   Assist    Assist level: Supervision/Verbal cueing Assistive device: Walker-rolling    Walk 150 feet activity   Assist    Assist level: Supervision/Verbal cueing Assistive device: Walker-rolling    Walk 10 feet on uneven surface  activity   Assist     Assist level: Moderate Assistance - Patient - 50 - 74% Assistive device: Aeronautical engineer Will patient use wheelchair at discharge?: No Type of Wheelchair: Manual    Wheelchair assist level: Supervision/Verbal cueing Max wheelchair distance: 50  Wheelchair 50 feet with 2 turns activity    Assist        Assist Level: Supervision/Verbal cueing   Wheelchair 150 feet activity     Assist Wheelchair 150 feet activity did not occur: Safety/medical concerns        Medical Problem List and Plan: 1. Decrease mobility and ADLs secondary to Right MCA infarct   Continue CIR  Weekend notes reviewed  Plan for d/c tomorrow  Will see patient for transitional care  management in 1-2 weeks post-discharge 2. DVT Prophylaxis/Anticoagulation: Pharmaceutical:Lovenox reduced to 30mg  per day 3. Pain Management:Tylenol as needed 4. Mood:LCSW to follow for evaluation and support 5. Neuropsych: This patient is ?fully capable of making decisions on his own behalf. 6. Skin/Wound Care:Routine pressure relief measures 7. Fluids/Electrolytes/Nutrition:Monitor I's and O's 8. ESRD stage V: HD M W F .Per Nephro 9. Anemia of chronic disease: Started on darbepoetinfor supplementation  Hgb 9.5 on 12/21  Recs per nephro 10. HTN: Monitor bid. Continue Cardura.  Vitals:   06/27/18 1920 06/28/18 0356  BP: 123/65 (!) 130/56  Pulse: 90 98  Resp: 16 18  Temp: 99.1 F (37.3 C) 99.1 F (37.3 C)  SpO2: 100% 100%   Controlled 12/23 11. Neuropathy:   gabapentin at renal dose--once daily. 12. OSA: Continue to use CPAP when napping or when asleep. 13.  Hyperglycemia  Blood glucose relatively controlled on 12/21  Continue to monitor 14.  Leukocytosis  WBCs 11.0 on 12/21, improving  Afebrile  Continue to monitor 15.  Urinary retention  PVR showing retention  I/O caths   LOS:12 days A FACE TO FACE EVALUATION WAS PERFORMED   Lorie Phenix 06/28/2018, 9:38 AM

## 2018-06-28 NOTE — Progress Notes (Signed)
Occupational Therapy Discharge Summary  Patient Details  Name: Jason [name]. MRN: [id number] Date of Birth: 11/17/1945   Patient has met 11 of 11 long term goals due to improved activity tolerance, improved balance, postural control, functional use of  LEFT upper extremity, improved attention, improved awareness and improved coordination.  Patient to discharge at overall Supervision level.  Patient's care partner is independent to provide the necessary physical and cognitive assistance at discharge.  Pt's wife has attended family education sessions and has voiced and demonstrated ability to provide the needed supervision assist at d/c. Pt is supervision overall using RW. He is most limited by generalized deconditioning and mild cognitive deficits. He is able to complete basic ADLs at supervision level using RW with occasional cuing for RW management and safety awareness. He is able to use L UE at non-dominant level independently.    Recommendation:  Patient will benefit from ongoing skilled OT services in outpatient setting to continue to advance functional skills in the area of BADL, iADL and Reduce care partner burden.  Equipment: Tub transfer bench. Pt has BSC  Reasons for discharge: treatment goals met and discharge from hospital  Patient/family agrees with progress made and goals achieved: Yes  OT Discharge Precautions/Restrictions  Precautions Precautions: Fall Restrictions Weight Bearing Restrictions: No Vision Baseline Vision/History: Wears glasses Wears Glasses: Reading only Patient Visual Report: No change from baseline Vision Assessment?: No apparent visual deficits Perception  Perception: Impaired Comments: Min. L inattention Praxis Praxis: Intact Cognition Overall Cognitive Status: Impaired/Different from baseline Arousal/Alertness: Awake/alert Orientation Level: Oriented X4 Selective Attention: Appears intact Memory: Impaired Memory Impairment: Decreased  recall of new information Awareness: Impaired Awareness Impairment: Anticipatory impairment Problem Solving: Impaired Problem Solving Impairment: Verbal complex;Functional complex Safety/Judgment: Impaired Comments: Slightly impulsive with decreased awareness of deficits and functional implications Sensation Sensation Light Touch: Appears Intact Proprioception: Appears Intact Coordination Gross Motor Movements are Fluid and Coordinated: No Fine Motor Movements are Fluid and Coordinated: No Coordination and Movement Description: Min. L hemiplegia 9 Hole Peg Test: R: 43.03, 43.3and 39.33 and L 46.63, 48.02, and 48.57 Motor  Motor Motor: Ataxia;Hemiplegia Trunk/Postural Assessment  Cervical Assessment Cervical Assessment: Within Functional Limits Thoracic Assessment Thoracic Assessment: Within Functional Limits Lumbar Assessment Lumbar Assessment: Within Functional Limits Postural Control Postural Control: Within Functional Limits  Balance Balance Balance Assessed: Yes Static Sitting Balance Static Sitting - Balance Support: No upper extremity supported;Feet supported Static Sitting - Level of Assistance: 6: Modified independent (Device/Increase time) Dynamic Sitting Balance Dynamic Sitting - Balance Support: No upper extremity supported;Feet supported;During functional activity Dynamic Sitting - Level of Assistance: 6: Modified independent (Device/Increase time);5: Stand by assistance Sitting balance - Comments: Sitting to complete bathing/dressing tasks Static Standing Balance Static Standing - Balance Support: During functional activity;No upper extremity supported Static Standing - Level of Assistance: 6: Modified independent (Device/Increase time);5: Stand by assistance Dynamic Standing Balance Dynamic Standing - Balance Support: During functional activity Dynamic Standing - Level of Assistance: 5: Stand by assistance Extremity/Trunk Assessment RUE Assessment RUE  Assessment: Within Functional Limits LUE Assessment LUE Assessment: Within Functional Limits General Strength Comments: 4+/5 throughout LUE Body System: Neuro   Macauley Mossberg L 06/28/2018, 6:53 AM

## 2018-06-28 NOTE — Progress Notes (Signed)
Social Work Patient ID: Jason Cotta Sr., male   DOB: 26-Oct-1945, 72 y.o.   MRN: [id number] Have placed pt on first run tomorrow for HD in preparation of discharge later that afternoon.

## 2018-06-29 DIAGNOSIS — D696 Thrombocytopenia, unspecified: Secondary | ICD-10-CM

## 2018-06-29 LAB — CBC
HCT: 33.7 % — ABNORMAL LOW (ref 39.0–52.0)
Hemoglobin: 10.3 g/dL — ABNORMAL LOW (ref 13.0–17.0)
MCH: 27.8 pg (ref 26.0–34.0)
MCHC: 30.6 g/dL (ref 30.0–36.0)
MCV: 91.1 fL (ref 80.0–100.0)
Platelets: 134 10*3/uL — ABNORMAL LOW (ref 150–400)
RBC: 3.7 MIL/uL — ABNORMAL LOW (ref 4.22–5.81)
RDW: 21.4 % — ABNORMAL HIGH (ref 11.5–15.5)
WBC: 9.2 10*3/uL (ref 4.0–10.5)
nRBC: 0 % (ref 0.0–0.2)

## 2018-06-29 MED ORDER — ACETAMINOPHEN 325 MG PO TABS: 325.0000 mg | ORAL_TABLET | ORAL | Status: DC | PRN

## 2018-06-29 MED ORDER — ASPIRIN 81 MG PO CHEW: 81.0000 mg | CHEWABLE_TABLET | Freq: Every day | ORAL | 0 refills | Status: DC

## 2018-06-29 MED ORDER — CALCIUM POLYCARBOPHIL 625 MG PO TABS: 1250.0000 mg | ORAL_TABLET | Freq: Two times a day (BID) | ORAL | Status: DC

## 2018-06-29 MED ORDER — PANTOPRAZOLE SODIUM 40 MG PO TBEC: 40.0000 mg | DELAYED_RELEASE_TABLET | Freq: Every day | ORAL | 0 refills | Status: DC

## 2018-06-29 MED ORDER — GABAPENTIN 300 MG PO CAPS: 300.0000 mg | ORAL_CAPSULE | Freq: Every day | ORAL | 0 refills | Status: DC

## 2018-06-29 MED ORDER — CALCITRIOL 0.25 MCG PO CAPS: 0.2500 ug | ORAL_CAPSULE | Freq: Every day | ORAL | 0 refills | Status: DC

## 2018-06-29 MED ORDER — TRAMADOL HCL 50 MG PO TABS: 50.0000 mg | ORAL_TABLET | Freq: Two times a day (BID) | ORAL | 0 refills | Status: DC | PRN

## 2018-06-29 MED ORDER — RENA-VITE PO TABS: 1.0000 | ORAL_TABLET | Freq: Every day | ORAL | 0 refills | Status: DC

## 2018-06-29 MED ORDER — TICAGRELOR 90 MG PO TABS: 90.0000 mg | ORAL_TABLET | Freq: Two times a day (BID) | ORAL | 0 refills | Status: DC

## 2018-06-29 MED ORDER — ATORVASTATIN CALCIUM 20 MG PO TABS: 20.0000 mg | ORAL_TABLET | Freq: Every day | ORAL | 0 refills | Status: DC

## 2018-06-29 MED ORDER — SACCHAROMYCES BOULARDII 250 MG PO CAPS: 250.0000 mg | ORAL_CAPSULE | Freq: Two times a day (BID) | ORAL | 0 refills | Status: DC

## 2018-06-29 MED ORDER — ALLOPURINOL 100 MG PO TABS: 100.0000 mg | ORAL_TABLET | Freq: Every day | ORAL | 0 refills | Status: DC

## 2018-06-29 NOTE — Plan of Care (Signed)
  Problem: Consults Goal: RH STROKE PATIENT EDUCATION Description See Patient Education module for education specifics  Outcome: Progressing   Problem: RH BOWEL ELIMINATION Goal: RH STG MANAGE BOWEL WITH ASSISTANCE Description STG Manage Bowel with Assistance. Mod I  Outcome: Progressing Goal: RH STG MANAGE BOWEL W/MEDICATION W/ASSISTANCE Description STG Manage Bowel with Medication with Assistance Mod I  Outcome: Progressing   Problem: RH SKIN INTEGRITY Goal: RH STG SKIN FREE OF INFECTION/BREAKDOWN Description Patient free from skin infection entire stay on rehab  Outcome: Progressing Goal: RH STG MAINTAIN SKIN INTEGRITY WITH ASSISTANCE Description STG Maintain Skin Integrity With Assistance.Mod I  Outcome: Progressing   Problem: RH SAFETY Goal: RH STG ADHERE TO SAFETY PRECAUTIONS W/ASSISTANCE/DEVICE Description STG Adhere to Safety Precautions With Assistance/Device. Mod I  Outcome: Progressing Goal: RH STG DECREASED RISK OF FALL WITH ASSISTANCE Description STG Decreased Risk of Fall With Assistance.Mod I  Outcome: Progressing   Problem: RH COGNITION-NURSING Goal: RH STG USES MEMORY AIDS/STRATEGIES W/ASSIST TO PROBLEM SOLVE Description STG Uses Memory Aids/Strategies With Assistance to Problem Solve. Mod  I  Outcome: Progressing   Problem: RH PAIN MANAGEMENT Goal: RH STG PAIN MANAGED AT OR BELOW PT'S PAIN GOAL Description Pain less than 2  Outcome: Progressing   Problem: RH KNOWLEDGE DEFICIT Goal: RH STG INCREASE KNOWLEDGE OF HYPERTENSION Description Increase knowledge  On hypertension mod I  Outcome: Progressing Goal: RH STG INCREASE KNOWLEGDE OF HYPERLIPIDEMIA Description Increase knowledge on hyperlipidemia mod I  Outcome: Progressing Goal: RH STG INCREASE KNOWLEDGE OF STROKE PROPHYLAXIS Description Increase knowledge on stroke prophylaxis mod I  Outcome: Progressing

## 2018-06-29 NOTE — Plan of Care (Signed)
Nutrition Education Note  PA consulted RD for renal diet education prior to d/c today.  Provided Food Pyramid with Healthy Eating for Kidney Disease to patient and wife. Reviewed food groups and provided written recommended serving sizes specifically determined for patient's current nutritional status.  Explained why diet restrictions are needed and provided lists of foods to limit/avoid that are high potassium, sodium, and phosphorus. Provided specific recommendations on safer alternatives of these foods. Strongly encouraged compliance of this diet.  Discussed importance of protein intake at each meal and snack. Provided examples of how to maximize protein intake throughout the day. Discussed need for fluid restriction with dialysis, importance of minimizing weight gain between HD treatments, and renal-friendly beverage options.  Encouraged pt to discuss specific diet questions/concerns with RD at HD outpatient facility. Teach back method used.  Expect fair to good compliance.  Body mass index is 19.94 kg/m. Pt meets criteria for normal weight based on current BMI.  Current diet order is Renal/Carb Modified with 1200 ml fluid restriction, patient is consuming approximately 75% of meals at this time. Labs and medications reviewed. No further nutrition interventions warranted at this time. RD contact information provided. If additional nutrition issues arise, please re-consult RD.   Gaynell Face, MS, RD, LDN Inpatient Clinical Dietitian Pager: [phone] Weekend/After Hours: [phone]

## 2018-06-29 NOTE — Progress Notes (Signed)
Patient received all the discharge instruction before they left.

## 2018-06-29 NOTE — Discharge Instructions (Signed)
Inpatient Rehab Discharge Instructions  Jason AVAKIAN Sr. Discharge date and time: 06/29/2018  Activities/Precautions/ Functional Status: Activity: no lifting, driving, or strenuous exercise till cleared by MD Diet: diabetic diet and renal diet--1200 cc fluid/day Wound Care: keep wound clean and dry   Functional status:  ___ No restrictions     ___ Walk up steps independently _X__ 24/7 supervision/assistance   ___ Walk up steps with assistance ___ Intermittent supervision/assistance  ___ Bathe/dress independently ___ Walk with walker     ___ Bathe/dress with assistance ___ Walk Independently    ___ Shower independently ___ Walk with assistance    _X__ Shower with assistance _X__ No alcohol     ___ Return to work/school ________   Special Instructions: 1. Limit fluids to 5 cups daily.  2. Wife to assist patient with medication administration/management. 3. Will need to monitor platelets serially for stability.    COMMUNITY REFERRALS UPON DISCHARGE:    Home Health:   PT, OT, RN,SP   [place]   Date of last service:06/29/2018  Medical Equipment/Items Ordered:  Agency/Supplier:[facility]   [phone]  OTHER:NEW DIALYSIS-SET UP AT [facility] Tuesday,THURSDAY SAT SLOT AT 12:40 pm   STROKE/TIA DISCHARGE INSTRUCTIONS SMOKING Cigarette smoking nearly doubles your risk of having a stroke & is the single most alterable risk factor  If you smoke or have smoked in the last 12 months, you are advised to quit smoking for your health.  Most of the excess cardiovascular risk related to smoking disappears within a year of stopping.  Ask you doctor about anti-smoking medications  [place] Quit Line: 1-800-QUIT NOW  Free Smoking Cessation Classes [phone]  CHOLESTEROL Know your levels; limit fat & cholesterol in your diet  Lipid Panel     Component Value Date/Time   CHOL 171 06/09/2018 0454   TRIG 101 06/09/2018 0454   TRIG 104  05/11/2006 1014   HDL 48 06/09/2018 0454   CHOLHDL 3.6 06/09/2018 0454   VLDL 20 06/09/2018 0454   LDLCALC 103 (H) 06/09/2018 0454      Many patients benefit from treatment even if their cholesterol is at goal.  Goal: Total Cholesterol (CHOL) less than 160  Goal:  Triglycerides (TRIG) less than 150  Goal:  HDL greater than 40  Goal:  LDL (LDLCALC) less than 100   BLOOD PRESSURE American Stroke Association blood pressure target is less that 120/80 mm/Hg  Your discharge blood pressure is:  BP: 123/83  Monitor your blood pressure  Limit your salt and alcohol intake  Many individuals will require more than one medication for high blood pressure  DIABETES (A1c is a blood sugar average for last 3 months) Goal HGBA1c is under 7% (HBGA1c is blood sugar average for last 3 months)  Diabetes:     Lab Results  Component Value Date   HGBA1C 5.5 06/09/2018     Your HGBA1c can be lowered with medications, healthy diet, and exercise.  Check your blood sugar as directed by your physician  Call your physician if you experience unexplained or low blood sugars.  PHYSICAL ACTIVITY/REHABILITATION Goal is 30 minutes at least 4 days per week  Activity: No driving, Therapies: see above Return to work: N/A  Activity decreases your risk of heart attack and stroke and makes your heart stronger.  It helps control your weight and blood pressure; helps you relax and can improve your mood.  Participate in a regular exercise program.  Talk with your doctor about the  best form of exercise for you (dancing, walking, swimming, cycling).  DIET/WEIGHT Goal is to maintain a healthy weight  Your discharge diet is:  Diet Order            Diet renal/carb modified with fluid restriction Diet-HS Snack? Nothing; Fluid restriction: 1200 mL Fluid; Room service appropriate? Yes; Fluid consistency: Thin  Diet effective now              liquids Your height is:  Height: 5\' 8"  (172.7 cm) Your current weight  is: Weight: 59.5 kg Your Body Mass Index (BMI) is:  BMI (Calculated): 19.95  Following the type of diet specifically designed for you will help prevent another stroke.  You are at goal weight   Your goal Body Mass Index (BMI) is 19-24.  Healthy food habits can help reduce 3 risk factors for stroke:  High cholesterol, hypertension, and excess weight.  RESOURCES Stroke/Support Group:  Call [phone]   STROKE EDUCATION PROVIDED/REVIEWED AND GIVEN TO PATIENT Stroke warning signs and symptoms How to activate emergency medical system (call 911). Medications prescribed at discharge. Need for follow-up after discharge. Personal risk factors for stroke. Pneumonia vaccine given:  Flu vaccine given:  My questions have been answered, the writing is legible, and I understand these instructions.  I will adhere to these goals & educational materials that have been provided to me after my discharge from the hospital.     My questions have been answered and I understand these instructions. I will adhere to these goals and the provided educational materials after my discharge from the hospital.  Patient/Caregiver Signature _______________________________ Date __________  Clinician Signature _______________________________________ Date __________  Please bring this form and your medication list with you to all your follow-up doctor's appointments.

## 2018-06-29 NOTE — Progress Notes (Signed)
[facility] ROUNDING NOTE   Subjective:   Hoping to leave and go home today.  Blood pressure 123/83 pulse 92 temperature 98.7 O2 sats 100% room air  Dialysis 06/26/2018 2.4 L removed  Sodium 139 potassium 3.7 chloride 103 CO2 26 glucose 101 creatinine 4.27 BUN 27 calcium 9.6 phosphorus 2.1 albumin 2.7 WBC 2.5 hemoglobin 9.5 platelets 167   Objective:  Vital signs in last 24 hours:  Temp:  [97.5 F (36.4 C)-99.4 F (37.4 C)] 98.7 F (37.1 C) (12/24 0440) Pulse Rate:  [84-112] 92 (12/24 0440) Resp:  [15-21] 16 (12/24 0440) BP: (103-126)/(55-83) 123/83 (12/24 0440) SpO2:  [97 %-100 %] 100 % (12/24 0440) Weight:  [57.8 kg-59.5 kg] 59.5 kg (12/24 0440)  Weight change: -0.9 kg Filed Weights   06/28/18 1230 06/28/18 1700 06/29/18 0440  Weight: 58.8 kg 57.8 kg 59.5 kg    Intake/Output: I/O last 3 completed shifts: In: 34 [P.O.:780] Out: 1702 [Urine:700; Other:1002]   Intake/Output this shift:  No intake/output data recorded.  CVS- RRR faint systolic murmur 3/6  RS- CTA no wheezes or rales ABD- BS present soft non-distended EXT- no edema   Basic Metabolic Panel: Recent Labs  Lab 06/22/18 1342 06/24/18 1323 06/26/18 1232 06/28/18 1339  NA 133* 132* 134* 139  K 3.5 3.4* 3.3* 3.7  CL 96* 95* 97* 103  CO2 23 23 25 26   GLUCOSE 144* 105* 114* 101*  BUN 47* 28* 25* 27*  CREATININE 5.94* 4.93* 4.89* 4.27*  CALCIUM 9.7 9.6 9.5 9.6  PHOS 3.5 2.2* 2.5 2.1*    Liver Function Tests: Recent Labs  Lab 06/22/18 1342 06/24/18 1323 06/26/18 1232 06/28/18 1339  ALBUMIN 2.7* 2.9* 2.6* 2.7*   No results for input(s): LIPASE, AMYLASE in the last 168 hours. No results for input(s): AMMONIA in the last 168 hours.  CBC: Recent Labs  Lab 06/22/18 1342 06/24/18 0536 06/26/18 1233 06/28/18 1339  WBC 13.3* 12.8* 11.0* 2.5*  HGB 7.1* 7.5* 9.5* 9.5*  HCT 23.1* 23.4* 29.8* 30.2*  MCV 91.7 91.1 90.9 89.9  PLT 257 237 256 167    Cardiac Enzymes: No  results for input(s): CKTOTAL, CKMB, CKMBINDEX, TROPONINI in the last 168 hours.  BNP: Invalid input(s): POCBNP  CBG: No results for input(s): GLUCAP in the last 168 hours.  Microbiology: Results for orders placed or performed during the hospital encounter of 06/08/18  MRSA PCR Screening     Status: None   Collection Time: 06/08/18  9:46 PM  Result Value Ref Range Status   MRSA by PCR NEGATIVE NEGATIVE Final    Comment:        The GeneXpert MRSA Assay (FDA approved for NASAL specimens only), is one component of a comprehensive MRSA colonization surveillance program. It is not intended to diagnose MRSA infection nor to guide or monitor treatment for MRSA infections. Performed at [facility], [place] [address], [place], [place] [address]     Coagulation Studies: No results for input(s): LABPROT, INR in the last 72 hours.  Urinalysis: No results for input(s): COLORURINE, LABSPEC, PHURINE, GLUCOSEU, HGBUR, BILIRUBINUR, KETONESUR, PROTEINUR, UROBILINOGEN, NITRITE, LEUKOCYTESUR in the last 72 hours.  Invalid input(s): APPERANCEUR    Imaging: No results found.   Medications:   . sodium chloride     . sodium chloride   Intravenous Once  . allopurinol  100 mg Oral Daily  . aspirin  81 mg Oral Daily  . atorvastatin  20 mg Oral q1800  . calcitRIOL  0.25 mcg  Oral Daily  . chlorhexidine  15 mL Mouth Rinse BID  . Chlorhexidine Gluconate Cloth  6 each Topical Q0600  . cholecalciferol  5,000 Units Oral Daily  . darbepoetin (ARANESP) injection - DIALYSIS  200 mcg Intravenous Q Tue-HD  . enoxaparin (LOVENOX) injection  30 mg Subcutaneous Q24H  . gabapentin  300 mg Oral QHS  . multivitamin  1 tablet Oral QHS  . pantoprazole  40 mg Oral Daily  . polycarbophil  1,250 mg Oral BID  . saccharomyces boulardii  250 mg Oral BID  . ticagrelor  90 mg Oral BID   sodium chloride, acetaminophen, aluminum hydroxide, bisacodyl, diphenhydrAMINE, guaiFENesin-dextromethorphan, heparin,  lidocaine (PF), lidocaine-prilocaine, loperamide, pentafluoroprop-tetrafluoroeth, polyethylene glycol, prochlorperazine **OR** prochlorperazine **OR** prochlorperazine, traMADol, traZODone  Assessment/ Plan:   Status post right MCA infarct 10 years on CIR  End-stage renal disease appears to have had last dialysis treatment 06/28/2018 ultrafiltration of 1 L. He has a slot TTS to be started [place] [facility]   Hypertension appears adequately controlled Cardura appears to be the only medication at this point  Anemia stable darbepoetin 06/22/2018 200 mcg status post 1 unit packed red blood cells 06/24/2018  Secondary hyperparathyroidism calcitriol 0.25 mcg daily.  Phosphorus acceptable without binder  Chronic systolic heart failure EF 20 to 25% on TEE this administration.  Access some difficulty cannulation stent placed 06/2018.  Appears to be functioning well  Leukopenia sudden drop in white count to 2.5.  Patient on brilinta  Will need recheck wbc.    LOS: [place] @TODAY @8 :19 AM

## 2018-07-01 ENCOUNTER — Encounter: Payer: Self-pay | Admitting: Physical Medicine & Rehabilitation

## 2018-07-01 ENCOUNTER — Telehealth: Payer: Self-pay

## 2018-07-01 DIAGNOSIS — N186 End stage renal disease: Secondary | ICD-10-CM | POA: Diagnosis not present

## 2018-07-01 DIAGNOSIS — Z23 Encounter for immunization: Secondary | ICD-10-CM | POA: Diagnosis not present

## 2018-07-01 DIAGNOSIS — D509 Iron deficiency anemia, unspecified: Secondary | ICD-10-CM | POA: Diagnosis not present

## 2018-07-01 DIAGNOSIS — N2581 Secondary hyperparathyroidism of renal origin: Secondary | ICD-10-CM | POA: Diagnosis not present

## 2018-07-01 NOTE — Telephone Encounter (Signed)
Attempt to follow up with transitional care management since returning home from rehab.  He was not home, left a message to call the office. Hospital follow up previously scheduled with pcp 07/14/2018 at 130.  Follow as appropriate.

## 2018-07-03 DIAGNOSIS — Z23 Encounter for immunization: Secondary | ICD-10-CM | POA: Diagnosis not present

## 2018-07-03 DIAGNOSIS — D509 Iron deficiency anemia, unspecified: Secondary | ICD-10-CM | POA: Diagnosis not present

## 2018-07-03 DIAGNOSIS — N2581 Secondary hyperparathyroidism of renal origin: Secondary | ICD-10-CM | POA: Diagnosis not present

## 2018-07-03 DIAGNOSIS — N186 End stage renal disease: Secondary | ICD-10-CM | POA: Diagnosis not present

## 2018-07-05 ENCOUNTER — Other Ambulatory Visit: Payer: Self-pay | Admitting: *Deleted

## 2018-07-05 DIAGNOSIS — Z23 Encounter for immunization: Secondary | ICD-10-CM | POA: Diagnosis not present

## 2018-07-05 DIAGNOSIS — D509 Iron deficiency anemia, unspecified: Secondary | ICD-10-CM | POA: Diagnosis not present

## 2018-07-05 DIAGNOSIS — N2581 Secondary hyperparathyroidism of renal origin: Secondary | ICD-10-CM | POA: Diagnosis not present

## 2018-07-05 DIAGNOSIS — N186 End stage renal disease: Secondary | ICD-10-CM | POA: Diagnosis not present

## 2018-07-05 NOTE — Patient Outreach (Signed)
[name]) Care Management  07/05/2018  Jason [name] Sr. 03/08/1946 [id number]   EMMI-stroke  RED ON EMMI ALERT Day # 3 Date: Friday 07/02/18 1000 Red Alert Reason:Feeling worse overall?Yes New problems walking/talking/speaking/seeing?Yes    Insurance: united health care medicare   [facility] admissions x 3 ED visits x 3in the last 6 months    Outreach attempt #1  [name] is able to verify HIPAA permission given to speak with the wife  Jason Management RN reviewed and addressed red alert with patient's wife  His wife reports he is sleeping but denies the answers for day 3 EMMI  She reports she was with Jason [name] on Friday 07/02/18 and he did not have any worsening issues then nor on today  She confirms the home health RN has called also this evening 07/05/18,  and will be seeing him on 07/06/18    Social: Jason [name] lives with his wife,Jason [name] but reports he is independent/assist with all his care. His wife  informs CM his health is good and denies concerns with home management of medical conditions, support system, DME, medicines or transportation   Conditions:CVA, HTN, hx of colon cancer s/p surgery per Jason [name], OSA with use of CPAP   Medications: Has medications delivered denies concerns with taking medications as prescribed, affording medications, side effects of medications and questions about medications  Appointments: 07/14/18 to see Dr Ronnald Ramp primary MD  Advance Directives: has pOA  Had advance directives completed with his oncologist.  Denies need for assist with or assist with changes for advance directives   Consent: [name]   Advised patient that there will be further automated EMMI- post discharge calls to assess how the patient is doing following the recent hospitalization Advised the patient that another call may be received from a nurse if any of their  responses were abnormal. Patient voiced understanding and was appreciative of f/u call.   Plan: [facility] RN CM will close case at this time as patient has been assessed and no needs identified.    Pt encouraged to return a call to [facility] RN CM prn  Jason [name] L. Jason Hamman, RN, BSN, [place] Coordinator Office number [phone] Mobile number [phone]  Main [facility] number [phone] Fax number [phone]

## 2018-07-06 ENCOUNTER — Telehealth: Payer: Self-pay | Admitting: Internal Medicine

## 2018-07-06 DIAGNOSIS — D631 Anemia in chronic kidney disease: Secondary | ICD-10-CM | POA: Diagnosis not present

## 2018-07-06 DIAGNOSIS — Z7902 Long term (current) use of antithrombotics/antiplatelets: Secondary | ICD-10-CM | POA: Diagnosis not present

## 2018-07-06 DIAGNOSIS — E211 Secondary hyperparathyroidism, not elsewhere classified: Secondary | ICD-10-CM | POA: Diagnosis not present

## 2018-07-06 DIAGNOSIS — C20 Malignant neoplasm of rectum: Secondary | ICD-10-CM | POA: Diagnosis not present

## 2018-07-06 DIAGNOSIS — I5022 Chronic systolic (congestive) heart failure: Secondary | ICD-10-CM | POA: Diagnosis not present

## 2018-07-06 DIAGNOSIS — I129 Hypertensive chronic kidney disease with stage 1 through stage 4 chronic kidney disease, or unspecified chronic kidney disease: Secondary | ICD-10-CM | POA: Diagnosis not present

## 2018-07-06 DIAGNOSIS — I130 Hypertensive heart and chronic kidney disease with heart failure and stage 1 through stage 4 chronic kidney disease, or unspecified chronic kidney disease: Secondary | ICD-10-CM | POA: Diagnosis not present

## 2018-07-06 DIAGNOSIS — Z87891 Personal history of nicotine dependence: Secondary | ICD-10-CM | POA: Diagnosis not present

## 2018-07-06 DIAGNOSIS — I69322 Dysarthria following cerebral infarction: Secondary | ICD-10-CM | POA: Diagnosis not present

## 2018-07-06 DIAGNOSIS — M1990 Unspecified osteoarthritis, unspecified site: Secondary | ICD-10-CM | POA: Diagnosis not present

## 2018-07-06 DIAGNOSIS — I272 Pulmonary hypertension, unspecified: Secondary | ICD-10-CM | POA: Diagnosis not present

## 2018-07-06 DIAGNOSIS — Z992 Dependence on renal dialysis: Secondary | ICD-10-CM | POA: Diagnosis not present

## 2018-07-06 DIAGNOSIS — J449 Chronic obstructive pulmonary disease, unspecified: Secondary | ICD-10-CM | POA: Diagnosis not present

## 2018-07-06 DIAGNOSIS — Z9689 Presence of other specified functional implants: Secondary | ICD-10-CM | POA: Diagnosis not present

## 2018-07-06 DIAGNOSIS — I69318 Other symptoms and signs involving cognitive functions following cerebral infarction: Secondary | ICD-10-CM | POA: Diagnosis not present

## 2018-07-06 DIAGNOSIS — I420 Dilated cardiomyopathy: Secondary | ICD-10-CM | POA: Diagnosis not present

## 2018-07-06 DIAGNOSIS — N186 End stage renal disease: Secondary | ICD-10-CM | POA: Diagnosis not present

## 2018-07-06 DIAGNOSIS — I69354 Hemiplegia and hemiparesis following cerebral infarction affecting left non-dominant side: Secondary | ICD-10-CM | POA: Diagnosis not present

## 2018-07-06 DIAGNOSIS — G4733 Obstructive sleep apnea (adult) (pediatric): Secondary | ICD-10-CM | POA: Diagnosis not present

## 2018-07-06 DIAGNOSIS — M109 Gout, unspecified: Secondary | ICD-10-CM | POA: Diagnosis not present

## 2018-07-06 DIAGNOSIS — Z95828 Presence of other vascular implants and grafts: Secondary | ICD-10-CM | POA: Diagnosis not present

## 2018-07-06 NOTE — Telephone Encounter (Signed)
Copied from [place] [id number]. Topic: Quick Communication - Home Health Verbal Orders >> Jul 06, 2018  3:04 PM Berneta Levins wrote: Caller/Agency: Glenard Haring with [place] Number: [phone], OK to leave a message Requesting OT/PT/Skilled Nursing/Social Work: nursing Frequency: 1 week 1, 2x a week for 2 weeks, 1x a week for 6 weeks.

## 2018-07-08 DIAGNOSIS — D689 Coagulation defect, unspecified: Secondary | ICD-10-CM | POA: Diagnosis not present

## 2018-07-08 DIAGNOSIS — N186 End stage renal disease: Secondary | ICD-10-CM | POA: Diagnosis not present

## 2018-07-08 DIAGNOSIS — D509 Iron deficiency anemia, unspecified: Secondary | ICD-10-CM | POA: Diagnosis not present

## 2018-07-08 DIAGNOSIS — D631 Anemia in chronic kidney disease: Secondary | ICD-10-CM | POA: Diagnosis not present

## 2018-07-08 DIAGNOSIS — N2581 Secondary hyperparathyroidism of renal origin: Secondary | ICD-10-CM | POA: Diagnosis not present

## 2018-07-08 DIAGNOSIS — R52 Pain, unspecified: Secondary | ICD-10-CM | POA: Diagnosis not present

## 2018-07-09 ENCOUNTER — Other Ambulatory Visit (HOSPITAL_COMMUNITY): Payer: Self-pay | Admitting: Physical Medicine and Rehabilitation

## 2018-07-09 NOTE — Telephone Encounter (Signed)
Spoke to [place] with [facility] and gave verbal okay as requested.   Pt has upcoming appt with PCP 07/14/2018

## 2018-07-10 DIAGNOSIS — N2581 Secondary hyperparathyroidism of renal origin: Secondary | ICD-10-CM | POA: Diagnosis not present

## 2018-07-10 DIAGNOSIS — D631 Anemia in chronic kidney disease: Secondary | ICD-10-CM | POA: Diagnosis not present

## 2018-07-10 DIAGNOSIS — D509 Iron deficiency anemia, unspecified: Secondary | ICD-10-CM | POA: Diagnosis not present

## 2018-07-10 DIAGNOSIS — R52 Pain, unspecified: Secondary | ICD-10-CM | POA: Diagnosis not present

## 2018-07-10 DIAGNOSIS — N186 End stage renal disease: Secondary | ICD-10-CM | POA: Diagnosis not present

## 2018-07-10 DIAGNOSIS — D689 Coagulation defect, unspecified: Secondary | ICD-10-CM | POA: Diagnosis not present

## 2018-07-12 DIAGNOSIS — D631 Anemia in chronic kidney disease: Secondary | ICD-10-CM | POA: Diagnosis not present

## 2018-07-12 DIAGNOSIS — G4733 Obstructive sleep apnea (adult) (pediatric): Secondary | ICD-10-CM | POA: Diagnosis not present

## 2018-07-12 DIAGNOSIS — I130 Hypertensive heart and chronic kidney disease with heart failure and stage 1 through stage 4 chronic kidney disease, or unspecified chronic kidney disease: Secondary | ICD-10-CM | POA: Diagnosis not present

## 2018-07-12 DIAGNOSIS — I69354 Hemiplegia and hemiparesis following cerebral infarction affecting left non-dominant side: Secondary | ICD-10-CM | POA: Diagnosis not present

## 2018-07-12 DIAGNOSIS — E211 Secondary hyperparathyroidism, not elsewhere classified: Secondary | ICD-10-CM | POA: Diagnosis not present

## 2018-07-12 DIAGNOSIS — I69322 Dysarthria following cerebral infarction: Secondary | ICD-10-CM | POA: Diagnosis not present

## 2018-07-12 DIAGNOSIS — Z992 Dependence on renal dialysis: Secondary | ICD-10-CM | POA: Diagnosis not present

## 2018-07-12 DIAGNOSIS — Z9689 Presence of other specified functional implants: Secondary | ICD-10-CM | POA: Diagnosis not present

## 2018-07-12 DIAGNOSIS — I420 Dilated cardiomyopathy: Secondary | ICD-10-CM | POA: Diagnosis not present

## 2018-07-12 DIAGNOSIS — M1990 Unspecified osteoarthritis, unspecified site: Secondary | ICD-10-CM | POA: Diagnosis not present

## 2018-07-12 DIAGNOSIS — J449 Chronic obstructive pulmonary disease, unspecified: Secondary | ICD-10-CM | POA: Diagnosis not present

## 2018-07-12 DIAGNOSIS — Z7902 Long term (current) use of antithrombotics/antiplatelets: Secondary | ICD-10-CM | POA: Diagnosis not present

## 2018-07-12 DIAGNOSIS — I5022 Chronic systolic (congestive) heart failure: Secondary | ICD-10-CM | POA: Diagnosis not present

## 2018-07-12 DIAGNOSIS — Z87891 Personal history of nicotine dependence: Secondary | ICD-10-CM | POA: Diagnosis not present

## 2018-07-12 DIAGNOSIS — M109 Gout, unspecified: Secondary | ICD-10-CM | POA: Diagnosis not present

## 2018-07-12 DIAGNOSIS — C20 Malignant neoplasm of rectum: Secondary | ICD-10-CM | POA: Diagnosis not present

## 2018-07-12 DIAGNOSIS — I272 Pulmonary hypertension, unspecified: Secondary | ICD-10-CM | POA: Diagnosis not present

## 2018-07-12 DIAGNOSIS — N186 End stage renal disease: Secondary | ICD-10-CM | POA: Diagnosis not present

## 2018-07-12 DIAGNOSIS — I69318 Other symptoms and signs involving cognitive functions following cerebral infarction: Secondary | ICD-10-CM | POA: Diagnosis not present

## 2018-07-12 DIAGNOSIS — Z95828 Presence of other vascular implants and grafts: Secondary | ICD-10-CM | POA: Diagnosis not present

## 2018-07-13 ENCOUNTER — Other Ambulatory Visit: Payer: Self-pay | Admitting: *Deleted

## 2018-07-13 ENCOUNTER — Telehealth: Payer: Self-pay | Admitting: *Deleted

## 2018-07-13 DIAGNOSIS — N186 End stage renal disease: Secondary | ICD-10-CM | POA: Diagnosis not present

## 2018-07-13 DIAGNOSIS — D689 Coagulation defect, unspecified: Secondary | ICD-10-CM | POA: Diagnosis not present

## 2018-07-13 DIAGNOSIS — N2581 Secondary hyperparathyroidism of renal origin: Secondary | ICD-10-CM | POA: Diagnosis not present

## 2018-07-13 DIAGNOSIS — D509 Iron deficiency anemia, unspecified: Secondary | ICD-10-CM | POA: Diagnosis not present

## 2018-07-13 DIAGNOSIS — R52 Pain, unspecified: Secondary | ICD-10-CM | POA: Diagnosis not present

## 2018-07-13 DIAGNOSIS — D631 Anemia in chronic kidney disease: Secondary | ICD-10-CM | POA: Diagnosis not present

## 2018-07-13 NOTE — Telephone Encounter (Signed)
Merry Proud PT [facility] called for POC 2wk1 1wk3.  Approval given.

## 2018-07-13 NOTE — Patient Outreach (Addendum)
[facility]) Care Management  07/13/2018  NICHALAS COIN Sr. [birth year] [id number]   EMMI- stroke  RED ON EMMI ALERT Day # 13 Date: Monday 07/12/2018 1003  Red Alert Reason: Martin Majestic to follow-up appointment? No  Insurance: united health care medicare [facility] admissions x 3  ED visits x 3 in the last 6 months   Drive to [facility] dialysis 12 1645 told staff  Outreach attempt # 1 successful  [name], [name] (whom Mr Castagna has given CM permission to speak with) is able to verify HIPAA [facility] Care Management RN reviewed and addressed red alert with patient Mrs [name] confirms with [facility] RN CM that the EMMI answer was correct Mr Seufert did not go to a f/u appointment as stated on 07/12/2018 because his follow up appointment with Dr Ronnald Ramp is not until 1/8/202 at 71 The EMMI system does not allow time for a comment to be entered for possible red alert answers [facility] RN CM reviewed and offered [facility] community and health coach services again with Mrs Mantz but she denied the need of these services. She confirms [facility] RN visit on 07/12/2018 She recalls a [facility] health coach call and denying need for service in 2018   When Cm assessed for other needs CM was informed by Mrs [name] that Mr Amero is having dialysis at the [facility] center off [facility] vs one closer to their home. She has spoken with the [facility] staff to see if she could switch dialysis center related to the drive (she drives Mr Kirschenmann and is concern about driving in the winter weather and [address] traffic) and was informed he was placed on a waiting list for the closer dialysis center at [facility] kidney care. CM explained to Mrs Finelli about the possible waiting list for transferring to a new dialysis center She voiced understanding   CM called and spoke with staff at the [facility] dialysis center who referred CM to the [facility] dialysis center. Cm spoke with Lelon Frohlich  at the [employer] center who will call the SW at the [place] center to request information to put Mr Pickrell on the waiting list. The list contains a pt waiting since July 2019 per Lelon Frohlich [facility] RN CM updated Mrs Patchin who was appreciative of the call to the dialysis center. Cm provided her with the number for the [facility] to check on the status of the transfer at intervals She voiced understanding   Social: Mr Matus lives with his wife,Phyllis but reports he is independent/assist with all his care. His wife  informs CM his health is good and denies concerns with home management of medical conditions, support system, DME, medicines or transportation   Conditions:CVA, HTN, hx of colon cancer s/p surgery per MR Vandenbos, OSA with use of CPAP   Medications: Has medications delivered denies concerns with taking medications as prescribed, affording medications, side effects of medications and questions about medications  Appointments: 07/14/18 to see Dr Ronnald Ramp primary MD  Advance Directives: has pOA  Had advance directives completed with his oncologist.Denies need for assist with or assist with changes for advance directives  Consent: [facility] RN CM reviewed [facility] services with patient. Patient gave verbal consent for services Advised patient that there will be further automated EMMI- post discharge calls to assess how the patient is doing following the recent hospitalization Advised the patient that another call may be received from a nurse if any of their  responses were abnormal. Patient voiced understanding and was appreciative of f/u call.   Plan: [facility] RN CM will close case at this time as patient has been assessed and no needs identified.   [facility] RN CM sent a successful outreach letter as discussed with [facility] brochure enclosed for review  Pt encouraged to return a call to [facility] RN CM prn    Samariah Hokenson L. Lavina Hamman, RN, BSN, [place] Coordinator Office number  [phone] Mobile number [phone]  Main [facility] number [phone] Fax number [phone]

## 2018-07-14 ENCOUNTER — Ambulatory Visit (INDEPENDENT_AMBULATORY_CARE_PROVIDER_SITE_OTHER): Payer: Medicare Other | Admitting: Internal Medicine

## 2018-07-14 ENCOUNTER — Encounter: Payer: Self-pay | Admitting: Internal Medicine

## 2018-07-14 VITALS — BP 122/60 | HR 90 | Temp 98.0°F | Resp 16 | Ht 68.0 in | Wt 165.0 lb

## 2018-07-14 DIAGNOSIS — Z8673 Personal history of transient ischemic attack (TIA), and cerebral infarction without residual deficits: Secondary | ICD-10-CM | POA: Diagnosis not present

## 2018-07-14 DIAGNOSIS — K591 Functional diarrhea: Secondary | ICD-10-CM | POA: Diagnosis not present

## 2018-07-14 DIAGNOSIS — I6310 Cerebral infarction due to embolism of unspecified precerebral artery: Secondary | ICD-10-CM

## 2018-07-14 MED ORDER — DIPHENOXYLATE-ATROPINE 2.5-0.025 MG PO TABS: 1.0000 | ORAL_TABLET | Freq: Four times a day (QID) | ORAL | 2 refills | Status: DC | PRN

## 2018-07-14 NOTE — Patient Instructions (Signed)
Chronic Diarrhea Diarrhea is a condition in which a person passes frequent loose and watery stools. It can cause you to feel weak and dehydrated. Dehydration can make you tired and thirsty. It can also cause a dry mouth, decreased urination, and dark yellow urine. Diarrhea is a sign of another underlying problem, such as:  Infection.  Medication side effects.  Dietary intolerance, such as lactose intolerance.  Conditions such as celiac disease, irritable bowel syndrome (IBS), or inflammatory bowel disease (IBD). In most cases, diarrhea lasts 2-3 days. Diarrhea that lasts longer than 4 weeks is called long-lasting (chronic) diarrhea. It is important that you treat your diarrhea as told by your health care provider. Follow these instructions at home: Follow these recommendations as told by your health care provider. Eating and drinking  Take an oral rehydration solution (ORS). This is a drink that is designed to keep you hydrated. It can be found at pharmacies and retail stores.  Drink clear fluids, such as water, ice chips, diluted fruit juice, and low-calorie sports drinks.  Follow the diet recommended by your health care provider. You may need to avoid foods that trigger diarrhea for you.  Avoid foods and beverages that contain a lot of sugar or caffeine.  Avoid alcohol.  Avoid spicy or fatty foods. General instructions      Drink enough fluid to keep your urine clear or pale yellow.  Wash your hands often and after each diarrhea episode. If soap and water are not available, use hand sanitizer.  Make sure that all people in your household wash their hands well and often.  Take over-the-counter and prescription medicines only as told by your health care provider.  If you were prescribed an antibiotic medicine, take it as told by your health care provider. Do not stop taking the antibiotic even if you start to feel better.  Rest at home while you recover.  Watch your  condition for any changes.  Take a warm bath to relieve any burning or pain from frequent diarrhea episodes.  Keep all follow-up visits as told by your health care provider. This is important. Contact a health care provider if:  You have a fever.  Your diarrhea gets worse or does not get better.  You have new symptoms.  You cannot drink fluids without vomiting.  You feel light-headed or dizzy.  You have a headache.  You have muscle cramps.  You have severe pain in the rectum. Get help right away if:  You have persistent vomiting.  You have chest pain.  You feel extremely weak or you faint.  You have bloody or black stools, or stools that look like tar.  You have severe pain, cramping, or bloating in your abdomen, or pain that stays in one place.  You have trouble breathing or you are breathing very quickly.  Your heart is beating very quickly.  Your skin feels cold and clammy.  You feel confused.  You have a severe headache.  You have signs of dehydration, such as: ? Dark urine, very little urine, or no urine. ? Cracked lips. ? Dry mouth. ? Sunken eyes. ? Sleepiness. ? Weakness. Summary  Chronic diarrhea is a condition in which a person passes frequent loose and watery stools for more than 4 weeks.  Diarrhea is a sign of another underlying problem.  Drink enough fluid to keep your urine clear or pale yellow to avoid dehydration.  Wash your hands often and after each diarrhea episode. If soap and water are  not available, use hand sanitizer.  It is important that you treat your diarrhea as told by your health care provider. This information is not intended to replace advice given to you by your health care provider. Make sure you discuss any questions you have with your health care provider. Document Released: 09/13/2003 Document Revised: 05/12/2016 Document Reviewed: 05/12/2016 Elsevier Interactive Patient Education  2019 [employer].

## 2018-07-14 NOTE — Progress Notes (Signed)
Subjective:  Patient ID: Jason Cotta Sr., male    DOB: July 24, 1945  Age: 73 y.o. MRN: [id number]  CC: Diarrhea   HPI Jason HINNERS Sr. presents for a hosp f/up - He complains of a several week hx of intermittent diarrhea.  He has tried to control the diarrhea with Imodium but has not had much success.  He is s/p an admission for embolic CVA.  His wife tells me he is not making much of a recovery.  She complains that he sleeps a lot, he has ataxic, he does not have much of an appetite, and he is losing weight.  Admit date: 06/16/2018 Discharge date: 06/29/2018  Discharge Diagnoses:  Principal Problem:   Stroke due to embolism [facility]) Active Problems:   ESRD on dialysis ([place])   Leukocytosis   Hyperglycemia   Anemia of chronic disease   Urinary retention   Benign essential HTN   Thrombocytopenia (HCC)    Outpatient Medications Prior to Visit  Medication Sig Dispense Refill  . acetaminophen (TYLENOL) 325 MG tablet Take 1-2 tablets (325-650 mg total) by mouth every 4 (four) hours as needed for mild pain.    Marland Kitchen allopurinol (ZYLOPRIM) 100 MG tablet Take 1 tablet (100 mg total) by mouth daily. 30 tablet 0  . aspirin 81 MG chewable tablet Chew 1 tablet (81 mg total) by mouth daily. 100 tablet 0  . atorvastatin (LIPITOR) 20 MG tablet Take 1 tablet (20 mg total) by mouth daily at 6 PM. 30 tablet 0  . calcitRIOL (ROCALTROL) 0.25 MCG capsule Take 1 capsule (0.25 mcg total) by mouth daily. 30 capsule 0  . cholecalciferol (VITAMIN D) 1000 units tablet Take 5,000 Units by mouth daily.    Marland Kitchen gabapentin (NEURONTIN) 300 MG capsule Take 1 capsule (300 mg total) by mouth at bedtime. 30 capsule 0  . multivitamin (RENA-VIT) TABS tablet Take 1 tablet by mouth at bedtime. 30 tablet 0  . pantoprazole (PROTONIX) 40 MG tablet Take 1 tablet (40 mg total) by mouth daily. 30 tablet 0  . polycarbophil (FIBERCON) 625 MG tablet Take 2 tablets (1,250 mg total) by mouth 2 (two) times daily.    Marland Kitchen saccharomyces  boulardii (FLORASTOR) 250 MG capsule Take 1 capsule (250 mg total) by mouth 2 (two) times daily. 100 capsule 0  . ticagrelor (BRILINTA) 90 MG TABS tablet Take 1 tablet (90 mg total) by mouth 2 (two) times daily. 60 tablet 0  . traMADol (ULTRAM) 50 MG tablet Take 1 tablet (50 mg total) by mouth every 12 (twelve) hours as needed for moderate pain. 15 tablet 0   No facility-administered medications prior to visit.     ROS Review of Systems  Constitutional: Positive for activity change, appetite change, fatigue and unexpected weight change. Negative for chills, diaphoresis and fever.  HENT: Negative.  Negative for trouble swallowing.   Eyes: Negative for visual disturbance.  Respiratory: Negative for cough, chest tightness, shortness of breath and wheezing.   Cardiovascular: Negative for chest pain, palpitations and leg swelling.  Gastrointestinal: Positive for diarrhea. Negative for constipation, nausea and vomiting.  Endocrine: Negative.   Genitourinary: Negative.   Musculoskeletal: Positive for gait problem. Negative for arthralgias, myalgias and neck pain.  Skin: Negative.  Negative for color change and pallor.  Neurological: Positive for weakness and numbness. Negative for dizziness, tremors and syncope.  Hematological: Negative for adenopathy. Does not bruise/bleed easily.  Psychiatric/Behavioral: Negative.     Objective:  BP 122/60   Pulse 90  Temp 98 F (36.7 C) (Oral)   Resp 16   Ht 5\' 8"  (1.727 m)   Wt 165 lb (74.8 kg)   SpO2 96%   BMI 25.09 kg/m   BP Readings from Last 3 Encounters:  07/14/18 122/60  06/29/18 123/83  06/16/18 (!) 115/52    Wt Readings from Last 3 Encounters:  07/14/18 165 lb (74.8 kg)  06/29/18 131 lb 2.8 oz (59.5 kg)  06/16/18 148 lb 9.4 oz (67.4 kg)    Physical Exam Vitals signs reviewed.  Constitutional:      Appearance: He is ill-appearing (he uses a 4-pronged walker).  HENT:     Nose: Nose normal. No congestion.     Mouth/Throat:      Mouth: Mucous membranes are moist.     Pharynx: No oropharyngeal exudate or posterior oropharyngeal erythema.  Eyes:     General: No scleral icterus.    Conjunctiva/sclera: Conjunctivae normal.  Neck:     Musculoskeletal: Normal range of motion and neck supple. No neck rigidity or muscular tenderness.  Cardiovascular:     Rate and Rhythm: Normal rate and regular rhythm.     Heart sounds: No murmur. No gallop.   Pulmonary:     Effort: Pulmonary effort is normal.     Breath sounds: No stridor. No wheezing, rhonchi or rales.  Abdominal:     General: Bowel sounds are normal.     Palpations: There is no hepatomegaly, splenomegaly or mass.     Tenderness: There is no abdominal tenderness. There is no guarding.  Musculoskeletal: Normal range of motion.        General: No swelling.     Right lower leg: No edema.  Skin:    General: Skin is warm.     Coloration: Skin is not pale.     Findings: No erythema or rash.  Neurological:     Cranial Nerves: Cranial nerve deficit, dysarthria and facial asymmetry present.     Sensory: Sensory deficit present.     Motor: Weakness, atrophy and abnormal muscle tone present. No tremor or seizure activity.     Coordination: Romberg sign negative. Coordination abnormal. Impaired rapid alternating movements.     Lab Results  Component Value Date   WBC 9.2 06/29/2018   HGB 10.3 (L) 06/29/2018   HCT 33.7 (L) 06/29/2018   PLT 134 (L) 06/29/2018   GLUCOSE 101 (H) 06/28/2018   CHOL 171 06/09/2018   TRIG 101 06/09/2018   HDL 48 06/09/2018   LDLDIRECT 120.0 06/04/2015   LDLCALC 103 (H) 06/09/2018   ALT 12 06/15/2018   AST 18 06/08/2018   NA 139 06/28/2018   K 3.7 06/28/2018   CL 103 06/28/2018   CREATININE 4.27 (H) 06/28/2018   BUN 27 (H) 06/28/2018   CO2 26 06/28/2018   TSH 5.41 (H) 09/23/2017   PSA 1.86 08/10/2013   INR 1.06 06/08/2018   HGBA1C 5.5 06/09/2018    No results found.  Assessment & Plan:   Jason [name] was seen today for  diarrhea.  Diagnoses and all orders for this visit:  Functional diarrhea- I have asked him to submit stool specimen to screen for C. difficile colitis and will treat him with Lomotil as needed to help control the symptoms. -     C. Difficile Toxin; Future -     Fecal lactoferrin, quant; Future -     diphenoxylate-atropine (LOMOTIL) 2.5-0.025 MG tablet; Take 1 tablet by mouth 4 (four) times daily as needed for diarrhea or  loose stools.  Cerebrovascular accident (CVA) due to embolism of precerebral artery ([place])- He is not improving much.  Will continue the current outpatient rehab program.   I am having Jason Cotta Sr. "[name] Sr" start on diphenoxylate-atropine. I am also having him maintain his cholecalciferol, acetaminophen, allopurinol, aspirin, atorvastatin, calcitRIOL, pantoprazole, polycarbophil, saccharomyces boulardii, gabapentin, multivitamin, ticagrelor, and traMADol.  Meds ordered this encounter  Medications  . diphenoxylate-atropine (LOMOTIL) 2.5-0.025 MG tablet    Sig: Take 1 tablet by mouth 4 (four) times daily as needed for diarrhea or loose stools.    Dispense:  65 tablet    Refill:  2     Follow-up: Return in about 3 weeks (around 08/04/2018).  [name], MD

## 2018-07-15 ENCOUNTER — Encounter: Payer: Medicare Other | Attending: Physical Medicine & Rehabilitation | Admitting: Physical Medicine & Rehabilitation

## 2018-07-15 DIAGNOSIS — N186 End stage renal disease: Secondary | ICD-10-CM | POA: Diagnosis not present

## 2018-07-15 DIAGNOSIS — D689 Coagulation defect, unspecified: Secondary | ICD-10-CM | POA: Diagnosis not present

## 2018-07-15 DIAGNOSIS — N2581 Secondary hyperparathyroidism of renal origin: Secondary | ICD-10-CM | POA: Diagnosis not present

## 2018-07-15 DIAGNOSIS — R52 Pain, unspecified: Secondary | ICD-10-CM | POA: Diagnosis not present

## 2018-07-15 DIAGNOSIS — D631 Anemia in chronic kidney disease: Secondary | ICD-10-CM | POA: Diagnosis not present

## 2018-07-15 DIAGNOSIS — D509 Iron deficiency anemia, unspecified: Secondary | ICD-10-CM | POA: Diagnosis not present

## 2018-07-16 DIAGNOSIS — I130 Hypertensive heart and chronic kidney disease with heart failure and stage 1 through stage 4 chronic kidney disease, or unspecified chronic kidney disease: Secondary | ICD-10-CM | POA: Diagnosis not present

## 2018-07-16 DIAGNOSIS — E211 Secondary hyperparathyroidism, not elsewhere classified: Secondary | ICD-10-CM | POA: Diagnosis not present

## 2018-07-16 DIAGNOSIS — I5022 Chronic systolic (congestive) heart failure: Secondary | ICD-10-CM | POA: Diagnosis not present

## 2018-07-16 DIAGNOSIS — I69322 Dysarthria following cerebral infarction: Secondary | ICD-10-CM | POA: Diagnosis not present

## 2018-07-16 DIAGNOSIS — Z87891 Personal history of nicotine dependence: Secondary | ICD-10-CM | POA: Diagnosis not present

## 2018-07-16 DIAGNOSIS — N186 End stage renal disease: Secondary | ICD-10-CM | POA: Diagnosis not present

## 2018-07-16 DIAGNOSIS — I69318 Other symptoms and signs involving cognitive functions following cerebral infarction: Secondary | ICD-10-CM | POA: Diagnosis not present

## 2018-07-16 DIAGNOSIS — Z7902 Long term (current) use of antithrombotics/antiplatelets: Secondary | ICD-10-CM | POA: Diagnosis not present

## 2018-07-16 DIAGNOSIS — M109 Gout, unspecified: Secondary | ICD-10-CM | POA: Diagnosis not present

## 2018-07-16 DIAGNOSIS — C20 Malignant neoplasm of rectum: Secondary | ICD-10-CM | POA: Diagnosis not present

## 2018-07-16 DIAGNOSIS — Z992 Dependence on renal dialysis: Secondary | ICD-10-CM | POA: Diagnosis not present

## 2018-07-16 DIAGNOSIS — I272 Pulmonary hypertension, unspecified: Secondary | ICD-10-CM | POA: Diagnosis not present

## 2018-07-16 DIAGNOSIS — I420 Dilated cardiomyopathy: Secondary | ICD-10-CM | POA: Diagnosis not present

## 2018-07-16 DIAGNOSIS — I69354 Hemiplegia and hemiparesis following cerebral infarction affecting left non-dominant side: Secondary | ICD-10-CM | POA: Diagnosis not present

## 2018-07-16 DIAGNOSIS — Z9689 Presence of other specified functional implants: Secondary | ICD-10-CM | POA: Diagnosis not present

## 2018-07-16 DIAGNOSIS — G4733 Obstructive sleep apnea (adult) (pediatric): Secondary | ICD-10-CM | POA: Diagnosis not present

## 2018-07-16 DIAGNOSIS — Z95828 Presence of other vascular implants and grafts: Secondary | ICD-10-CM | POA: Diagnosis not present

## 2018-07-16 DIAGNOSIS — M1990 Unspecified osteoarthritis, unspecified site: Secondary | ICD-10-CM | POA: Diagnosis not present

## 2018-07-16 DIAGNOSIS — J449 Chronic obstructive pulmonary disease, unspecified: Secondary | ICD-10-CM | POA: Diagnosis not present

## 2018-07-16 DIAGNOSIS — D631 Anemia in chronic kidney disease: Secondary | ICD-10-CM | POA: Diagnosis not present

## 2018-07-17 ENCOUNTER — Other Ambulatory Visit (HOSPITAL_COMMUNITY): Payer: Self-pay | Admitting: Physical Medicine and Rehabilitation

## 2018-07-17 DIAGNOSIS — N186 End stage renal disease: Secondary | ICD-10-CM | POA: Diagnosis not present

## 2018-07-17 DIAGNOSIS — N2581 Secondary hyperparathyroidism of renal origin: Secondary | ICD-10-CM | POA: Diagnosis not present

## 2018-07-17 DIAGNOSIS — D689 Coagulation defect, unspecified: Secondary | ICD-10-CM | POA: Diagnosis not present

## 2018-07-17 DIAGNOSIS — R52 Pain, unspecified: Secondary | ICD-10-CM | POA: Diagnosis not present

## 2018-07-17 DIAGNOSIS — D509 Iron deficiency anemia, unspecified: Secondary | ICD-10-CM | POA: Diagnosis not present

## 2018-07-17 DIAGNOSIS — D631 Anemia in chronic kidney disease: Secondary | ICD-10-CM | POA: Diagnosis not present

## 2018-07-18 ENCOUNTER — Other Ambulatory Visit: Payer: Self-pay | Admitting: Internal Medicine

## 2018-07-18 DIAGNOSIS — K591 Functional diarrhea: Secondary | ICD-10-CM

## 2018-07-19 ENCOUNTER — Ambulatory Visit: Payer: Self-pay | Admitting: *Deleted

## 2018-07-19 ENCOUNTER — Other Ambulatory Visit: Payer: Self-pay | Admitting: Internal Medicine

## 2018-07-19 MED ORDER — ATORVASTATIN CALCIUM 20 MG PO TABS: 20.0000 mg | ORAL_TABLET | Freq: Every day | ORAL | 0 refills | Status: DC

## 2018-07-19 NOTE — Telephone Encounter (Signed)
Patient was hospitalized for CVA mid December. Wife is reporting he has had diarrhea since the hospitalization.He was seen by Dr. Ronnald Ramp 07/14/18 for functional diarrhea and prescribed lomotil 4x day. He has been taking it as prescribed but continues to have watery stools 2-4x daily. He does have "stomach pain" just before the stools and it resolves after the diarrhea. No fever/blood in stool. Reviewed advice care with his wife. She will attempt to bring in a stool sample to the appointment tomorrow with Jodi Mourning, NP as no availability with PCP this week. He has a noon appointment for hemodialysis after his appointment.  Reason for Disposition . [1] Recent hospitalization AND [2] diarrhea present > 3 days  Answer Assessment - Initial Assessment Questions 1. DIARRHEA SEVERITY: "How bad is the diarrhea?" "How many extra stools have you had in the past 24 hours than normal?"    - NO DIARRHEA (SCALE 0)   - MILD (SCALE 1-3): Few loose or mushy BMs; increase of 1-3 stools over normal daily number of stools; mild increase in ostomy output.   -  MODERATE (SCALE 4-7): Increase of 4-6 stools daily over normal; moderate increase in ostomy output. * SEVERE (SCALE 8-10; OR 'WORST POSSIBLE'): Increase of 7 or more stools daily over normal; moderate increase in ostomy output; incontinence.     Some days up to 4 watery stools/day. 2. ONSET: "When did the diarrhea begin?"      Has been constant since his last appointment  3. BM CONSISTENCY: "How loose or watery is the diarrhea?"      Watery with small amount of pieces. 4. VOMITING: "Are you also vomiting?" If so, ask: "How many times in the past 24 hours?"      no 5. ABDOMINAL PAIN: "Are you having any abdominal pain?" If yes: "What does it feel like?" (e.g., crampy, dull, intermittent, constant)      Mild stomach "ache"that resolves with a stool 6. ABDOMINAL PAIN SEVERITY: If present, ask: "How bad is the pain?"  (e.g., Scale 1-10; mild, moderate, or severe)  - MILD (1-3): doesn't interfere with normal activities, abdomen soft and not tender to touch    - MODERATE (4-7): interferes with normal activities or awakens from sleep, tender to touch    - SEVERE (8-10): excruciating pain, doubled over, unable to do any normal activities       mild 7. ORAL INTAKE: If vomiting, "Have you been able to drink liquids?" "How much fluids have you had in the past 24 hours?"      8. HYDRATION: "Any signs of dehydration?" (e.g., dry mouth [not just dry lips], too weak to stand, dizziness, new weight loss) "When did you last urinate?"      9. EXPOSURE: "Have you traveled to a foreign country recently?" "Have you been exposed to anyone with diarrhea?" "Could you have eaten any food that was spoiled?"     *No Answer* 10. ANTIBIOTIC USE: "Are you taking antibiotics now or have you taken antibiotics in the past 2 months?"       *No Answer* 11. OTHER SYMPTOMS: "Do you have any other symptoms?" (e.g., fever, blood in stool)       *No Answer* 12. PREGNANCY: "Is there any chance you are pregnant?" "When was your last menstrual period?"       *No Answer*  Protocols used: DIARRHEA-A-AH

## 2018-07-20 ENCOUNTER — Ambulatory Visit (INDEPENDENT_AMBULATORY_CARE_PROVIDER_SITE_OTHER): Payer: Medicare Other | Admitting: Family

## 2018-07-20 ENCOUNTER — Other Ambulatory Visit: Payer: Medicare Other

## 2018-07-20 ENCOUNTER — Encounter: Payer: Self-pay | Admitting: Family

## 2018-07-20 VITALS — BP 120/62 | HR 116 | Temp 98.6°F | Ht 68.0 in | Wt 165.0 lb

## 2018-07-20 DIAGNOSIS — R52 Pain, unspecified: Secondary | ICD-10-CM | POA: Diagnosis not present

## 2018-07-20 DIAGNOSIS — D631 Anemia in chronic kidney disease: Secondary | ICD-10-CM | POA: Diagnosis not present

## 2018-07-20 DIAGNOSIS — N2581 Secondary hyperparathyroidism of renal origin: Secondary | ICD-10-CM | POA: Diagnosis not present

## 2018-07-20 DIAGNOSIS — R197 Diarrhea, unspecified: Secondary | ICD-10-CM | POA: Diagnosis not present

## 2018-07-20 DIAGNOSIS — D689 Coagulation defect, unspecified: Secondary | ICD-10-CM | POA: Diagnosis not present

## 2018-07-20 DIAGNOSIS — K591 Functional diarrhea: Secondary | ICD-10-CM

## 2018-07-20 DIAGNOSIS — D509 Iron deficiency anemia, unspecified: Secondary | ICD-10-CM | POA: Diagnosis not present

## 2018-07-20 DIAGNOSIS — E785 Hyperlipidemia, unspecified: Secondary | ICD-10-CM | POA: Diagnosis not present

## 2018-07-20 DIAGNOSIS — N186 End stage renal disease: Secondary | ICD-10-CM | POA: Diagnosis not present

## 2018-07-20 MED ORDER — ATORVASTATIN CALCIUM 20 MG PO TABS: 20.0000 mg | ORAL_TABLET | Freq: Every day | ORAL | 1 refills | Status: DC

## 2018-07-20 MED ORDER — DIPHENOXYLATE-ATROPINE 2.5-0.025 MG PO TABS: 1.0000 | ORAL_TABLET | Freq: Four times a day (QID) | ORAL | 0 refills | Status: DC | PRN

## 2018-07-20 NOTE — Progress Notes (Signed)
Jason Cotta Sr. is a 73 y.o. male with the following history as recorded in EpicCare:  Patient Active Problem List   Diagnosis Date Noted  . Functional diarrhea 07/14/2018  . Thrombocytopenia ([place]) 06/29/2018  . Benign essential HTN   . Hyperglycemia   . Anemia of chronic disease   . ESRD on dialysis ([place]) 06/16/2018  . Mild malnutrition ([place]) 06/16/2018  . Stroke due to embolism ([place]) 06/16/2018  . Acute ischemic stroke ([place]) 06/08/2018  . CHF (congestive heart failure) ([place]) 05/08/2018  . Normocytic anemia 05/08/2018  . Hyperlipidemia LDL goal <70 09/23/2017  . OAB (overactive bladder) 01/09/2016  . SUI (stress urinary incontinence), male 01/09/2016  . Hypersomnia 10/30/2015  . Obesity (BMI 30.0-34.9) 10/30/2015  . Routine general medical examination at a health care facility 06/04/2015  . Medicare annual wellness visit, subsequent 06/04/2015  . Hereditary and idiopathic peripheral neuropathy 09/14/2014  . Rectal cancer ([place]) 02/01/2013  . Obstructive sleep apnea 07/29/2010  . GOUT 05/11/2007  . ERECTILE DYSFUNCTION, MILD 05/11/2007  . Essential hypertension 05/11/2007    Current Outpatient Medications  Medication Sig Dispense Refill  . acetaminophen (TYLENOL) 325 MG tablet Take 1-2 tablets (325-650 mg total) by mouth every 4 (four) hours as needed for mild pain.    Marland Kitchen allopurinol (ZYLOPRIM) 100 MG tablet Take 1 tablet (100 mg total) by mouth daily. 30 tablet 0  . aspirin 81 MG chewable tablet Chew 1 tablet (81 mg total) by mouth daily. 100 tablet 0  . atorvastatin (LIPITOR) 20 MG tablet Take 1 tablet (20 mg total) by mouth daily at 6 PM. 30 tablet 1  . calcitRIOL (ROCALTROL) 0.25 MCG capsule Take 1 capsule (0.25 mcg total) by mouth daily. 30 capsule 0  . cholecalciferol (VITAMIN D) 1000 units tablet Take 5,000 Units by mouth daily.    . diphenoxylate-atropine (LOMOTIL) 2.5-0.025 MG tablet Take 1 tablet by mouth 4 (four) times daily as needed for diarrhea or loose stools. 65  tablet 0  . gabapentin (NEURONTIN) 300 MG capsule Take 1 capsule (300 mg total) by mouth at bedtime. 30 capsule 0  . multivitamin (RENA-VIT) TABS tablet Take 1 tablet by mouth at bedtime. 30 tablet 0  . pantoprazole (PROTONIX) 40 MG tablet Take 1 tablet (40 mg total) by mouth daily. 30 tablet 0  . polycarbophil (FIBERCON) 625 MG tablet Take 2 tablets (1,250 mg total) by mouth 2 (two) times daily.    Marland Kitchen saccharomyces boulardii (FLORASTOR) 250 MG capsule Take 1 capsule (250 mg total) by mouth 2 (two) times daily. 100 capsule 0  . ticagrelor (BRILINTA) 90 MG TABS tablet Take 1 tablet (90 mg total) by mouth 2 (two) times daily. 60 tablet 0  . traMADol (ULTRAM) 50 MG tablet Take 1 tablet (50 mg total) by mouth every 12 (twelve) hours as needed for moderate pain. 15 tablet 0   No current facility-administered medications for this visit.     Allergies: Nsaids; Lactose intolerance (gi); and Amlodipine  Past Medical History:  Diagnosis Date  . Anemia   . Arthritis   . Chronic renal insufficiency 07/31/2011  . COPD (chronic obstructive pulmonary disease) ([place])    pt denies  . Deficiency anemia 07/20/2012  . Diverticulosis   . Edema 09/21/2010   Qualifier: Diagnosis of  By: Jerold Coombe    . ERECTILE DYSFUNCTION, MILD 05/11/2007   Qualifier: Diagnosis of  By: Sherren Mocha MD, Jory Ee   . Exertional dyspnea 10/30/2015  . GOUT 05/11/2007   Qualifier: Diagnosis of  By: Sherren Mocha MD, Jory Ee Hereditary and idiopathic peripheral neuropathy 09/14/2014  . History of radiation therapy 02/21/13-03/31/13   rectum 50.4Gy total dose  . Hypersomnia 10/30/2015  . Hypertension   . Lung nodule 10/30/2015  . OAB (overactive bladder) 01/09/2016  . Obesity (BMI 30.0-34.9) 10/30/2015  . Obstructive sleep apnea 07/29/2010   HST 11/2015 AHI 54.  On autocpap  >> 10 cm   . Peripheral edema 09/19/2013  . rectal ca dx'd 02/01/13   rectal. Radiation and chemotherapy- remains on chemotherapy-next tx. 08-22-13.   . S/P partial  lobectomy of lung 11/20/2015  . S/P thoracotomy   . SUI (stress urinary incontinence), male 01/09/2016    Past Surgical History:  Procedure Laterality Date  . A/V FISTULAGRAM Left 06/14/2018   Procedure: A/V FISTULAGRAM;  Surgeon: Waynetta Sandy, MD;  Location: [facility];  Service: Cardiovascular;  Laterality: Left;  . AV FISTULA PLACEMENT Left 03/24/2016   Procedure: CREATION OF LEFT BRACIOCEPHALIC ARTERIOVENOUS (AV) FISTULA;  Surgeon: Elam Dutch, MD;  Location: [facility];  Service: Vascular;  Laterality: Left;  . COLON SURGERY  05/20/2013  . COLONOSCOPY  2010   [facility]  . EUS N/A 02/03/2013   Procedure: LOWER ENDOSCOPIC ULTRASOUND (EUS);  Surgeon: Milus Banister, MD;  Location: [facility];  Service: Endoscopy;  Laterality: N/A;  . ILEOSTOMY CLOSURE N/A 08/24/2013   Procedure: CLOSURE OF LOOP ILEOSTOMY ;  Surgeon: Leighton Ruff, MD;  Location: [facility];  Service: General;  Laterality: N/A;  . IR ANGIO VERTEBRAL SEL SUBCLAVIAN INNOMINATE BILAT MOD SED  06/08/2018  . IR CT HEAD LTD  06/08/2018  . IR INTRAVSC STENT CERV CAROTID W/O EMB-PROT MOD SED INC ANGIO  06/08/2018  . IR PERCUTANEOUS ART THROMBECTOMY/INFUSION INTRACRANIAL INC DIAG ANGIO  06/08/2018  . LAPAROSCOPIC LOW ANTERIOR RESECTION N/A 05/20/2013   Procedure: LAPAROSCOPIC LOW ANTERIOR RESECTION WITH SPLENIC FLEXURE MOBILIZATION;  Surgeon: Leighton Ruff, MD;  Location: [facility];  Service: General;  Laterality: N/A;  . OSTOMY N/A 05/20/2013   Procedure: diverting OSTOMY;  Surgeon: Leighton Ruff, MD;  Location: [facility];  Service: General;  Laterality: N/A;  . PERIPHERAL VASCULAR INTERVENTION Left 06/14/2018   Procedure: PERIPHERAL VASCULAR INTERVENTION;  Surgeon: Waynetta Sandy, MD;  Location: [facility];  Service: Cardiovascular;  Laterality: Left;  AVF on the Left  . RADIOLOGY WITH ANESTHESIA N/A 06/08/2018   Procedure: IR WITH ANESTHESIA;  Surgeon: Radiologist, Medication, MD;  Location: [facility];   Service: Radiology;  Laterality: N/A;  . VIDEO ASSISTED THORACOSCOPY (VATS)/WEDGE RESECTION Left 11/20/2015   Procedure: VIDEO ASSISTED THORACOSCOPY (VATS)/LUNG RESECTION;  Surgeon: [name], MD;  Location: [facility];  Service: Thoracic;  Laterality: Left;  Marland Kitchen VIDEO BRONCHOSCOPY N/A 11/20/2015   Procedure: VIDEO BRONCHOSCOPY;  Surgeon: [name], MD;  Location: [facility];  Service: Thoracic;  Laterality: N/A;    Family History  Problem Relation Age of Onset  . Stroke Mother   . Hypertension Mother   . Stroke Father   . Hypertension Father   . Hypertension Other   . Colon cancer Neg Hx     Social History   Tobacco Use  . Smoking status: Former Smoker    Packs/day: 0.50    Years: 10.00    Pack years: 5.00    Types: Cigarettes    Last attempt to quit: 07/07/1977    Years since quitting: 41.0  . Smokeless tobacco: Never Used  Substance Use Topics  . Alcohol  use: Yes    Alcohol/week: 0.0 standard drinks    Comment: drinks occasionally    Subjective:  Patient was seen with concerns for diarrhea on July 14, 2018; symptoms started since hospitalization for CVA at the end of December; was told by his PCP to get stool testing done at that appointment- somehow patient and wife did not understand the testing that was prescribed; continuing to have the same symptoms today; has been using the Lomotil with limited benefit- requesting updated prescription today; has had to start doing dialysis after stroke- 3x per week; has spoken to nephrologist about diarrhea- not felt to be related to dialysis complications.     Objective:  Vitals:   07/20/18 1019  BP: 120/62  Pulse: (!) 116  Temp: 98.6 F (37 C)  TempSrc: Oral  SpO2: 98%  Weight: 165 lb (74.8 kg)  Height: 5\' 8"  (1.727 m)    General: Well developed, well nourished, in no acute distress  Skin : Warm and dry.  Head: Normocephalic and atraumatic  Lungs: Respirations unlabored;  Abdomen: Soft; nontender; nondistended;  normoactive bowel sounds; no masses or hepatosplenomegaly  Musculoskeletal: No deformities; no active joint inflammation  Extremities: No edema, cyanosis, clubbing  Vessels: Symmetric bilaterally  Neurologic: Alert and oriented; speech intact; face symmetrical; moves all extremities well; CNII-XII intact without focal deficit  Assessment:  1. Diarrhea, unspecified type   2. Functional diarrhea   3. Hyperlipidemia LDL goal <70     Plan:  Explained to patient and his wife that they need to complete stool studies that were ordered last week- they agree to go to lab today; refill is given on Lomotil; Do not feel labs are warranted today as he is having dialysis 3x per week and labs are being monitored there.  Rx for Lipitor updated as requested.   No follow-ups on file.  Orders Placed This Encounter  Procedures  . Gastrointestinal Pathogen Panel PCR    Standing Status:   Future    Standing Expiration Date:   07/20/2019  . C. Difficile Toxin    Standing Status:   Future    Standing Expiration Date:   07/21/2019  . Fecal Lactoferrin    Standing Status:   Future    Standing Expiration Date:   07/21/2019    Requested Prescriptions   Signed Prescriptions Disp Refills  . atorvastatin (LIPITOR) 20 MG tablet 30 tablet 1    Sig: Take 1 tablet (20 mg total) by mouth daily at 6 PM.  . diphenoxylate-atropine (LOMOTIL) 2.5-0.025 MG tablet 65 tablet 0    Sig: Take 1 tablet by mouth 4 (four) times daily as needed for diarrhea or loose stools.

## 2018-07-20 NOTE — Patient Instructions (Signed)
Food Choices to Help Relieve Diarrhea, Adult  When you have diarrhea, the foods you eat and your eating habits are very important. Choosing the right foods and drinks can help:   Relieve diarrhea.   Replace lost fluids and nutrients.   Prevent dehydration.  What general guidelines should I follow?    Relieving diarrhea   Choose foods with less than 2 g or .07 oz. of fiber per serving.   Limit fats to less than 8 tsp (38 g or 1.34 oz.) a day.   Avoid the following:  ? Foods and beverages sweetened with high-fructose corn syrup, honey, or sugar alcohols such as xylitol, sorbitol, and mannitol.  ? Foods that contain a lot of fat or sugar.  ? Fried, greasy, or spicy foods.  ? High-fiber grains, breads, and cereals.  ? Raw fruits and vegetables.   Eat foods that are rich in probiotics. These foods include dairy products such as yogurt and fermented milk products. They help increase healthy bacteria in the stomach and intestines (gastrointestinal tract, or GI tract).   If you have lactose intolerance, avoid dairy products. These may make your diarrhea worse.   Take medicine to help stop diarrhea (antidiarrheal medicine) only as told by your health care provider.  Replacing nutrients   Eat small meals or snacks every 3-4 hours.   Eat bland foods, such as white rice, toast, or baked potato, until your diarrhea starts to get better. Gradually reintroduce nutrient-rich foods as tolerated or as told by your health care provider. This includes:  ? Well-cooked protein foods.  ? Peeled, seeded, and soft-cooked fruits and vegetables.  ? Low-fat dairy products.   Take vitamin and mineral supplements as told by your health care provider.  Preventing dehydration   Start by sipping water or a special solution to prevent dehydration (oral rehydration solution, ORS). Urine that is clear or pale yellow means that you are getting enough fluid.   Try to drink at least 8-10 cups of fluid each day to help replace lost  fluids.   You may add other liquids in addition to water, such as clear juice or decaffeinated sports drinks, as tolerated or as told by your health care provider.   Avoid drinks with caffeine, such as coffee, tea, or soft drinks.   Avoid alcohol.  What foods are recommended?         The items listed may not be a complete list. Talk with your health care provider about what dietary choices are best for you.  Grains  White rice. White, French, or pita breads (fresh or toasted), including plain rolls, buns, or bagels. White pasta. Saltine, soda, or graham crackers. Pretzels. Low-fiber cereal. Cooked cereals made with water (such as cornmeal, farina, or cream cereals). Plain muffins. Matzo. Melba toast. Zwieback.  Vegetables  Potatoes (without the skin). Most well-cooked and canned vegetables without skins or seeds. Tender lettuce.  Fruits  Apple sauce. Fruits canned in juice. Cooked apricots, cherries, grapefruit, peaches, pears, or plums. Fresh bananas and cantaloupe.  Meats and other protein foods  Baked or boiled chicken. Eggs. Tofu. Fish. Seafood. Smooth nut butters. Ground or well-cooked tender beef, ham, veal, lamb, pork, or poultry.  Dairy  Plain yogurt, kefir, and unsweetened liquid yogurt. Lactose-free milk, buttermilk, skim milk, or soy milk. Low-fat or nonfat hard cheese.  Beverages  Water. Low-calorie sports drinks. Fruit juices without pulp. Strained tomato and vegetable juices. Decaffeinated teas. Sugar-free beverages not sweetened with sugar alcohols. Oral rehydration solutions, if   approved by your health care provider.  Seasoning and other foods  Bouillon, broth, or soups made from recommended foods.  What foods are not recommended?  The items listed may not be a complete list. Talk with your health care provider about what dietary choices are best for you.  Grains  Whole grain, whole wheat, bran, or rye breads, rolls, pastas, and crackers. Wild or brown rice. Whole grain or bran cereals. Barley.  Oats and oatmeal. Corn tortillas or taco shells. Granola. Popcorn.  Vegetables  Raw vegetables. Fried vegetables. Cabbage, broccoli, Brussels sprouts, artichokes, baked beans, beet greens, corn, kale, legumes, peas, sweet potatoes, and yams. Potato skins. Cooked spinach and cabbage.  Fruits  Dried fruit, including raisins and dates. Raw fruits. Stewed or dried prunes. Canned fruits with syrup.  Meat and other protein foods  Fried or fatty meats. Deli meats. Chunky nut butters. Nuts and seeds. Beans and lentils. Bacon. Hot dogs. Sausage.  Dairy  High-fat cheeses. Whole milk, chocolate milk, and beverages made with milk, such as milk shakes. Half-and-half. Cream. sour cream. Ice cream.  Beverages  Caffeinated beverages (such as coffee, tea, soda, or energy drinks). Alcoholic beverages. Fruit juices with pulp. Prune juice. Soft drinks sweetened with high-fructose corn syrup or sugar alcohols. High-calorie sports drinks.  Fats and oils  Butter. Cream sauces. Margarine. Salad oils. Plain salad dressings. Olives. Avocados. Mayonnaise.  Sweets and desserts  Sweet rolls, doughnuts, and sweet breads. Sugar-free desserts sweetened with sugar alcohols such as xylitol and sorbitol.  Seasoning and other foods  Honey. Hot sauce. Chili powder. Gravy. Cream-based or milk-based soups. Pancakes and waffles.  Summary   When you have diarrhea, the foods you eat and your eating habits are very important.   Make sure you get at least 8-10 cups of fluid each day, or enough to keep your urine clear or pale yellow.   Eat bland foods and gradually reintroduce healthy, nutrient-rich foods as tolerated, or as told by your health care provider.   Avoid high-fiber, fried, greasy, or spicy foods.  This information is not intended to replace advice given to you by your health care provider. Make sure you discuss any questions you have with your health care provider.  Document Released: 09/13/2003 Document Revised: 06/20/2016 Document Reviewed:  06/20/2016  Elsevier Interactive Patient Education  2019 Elsevier Inc.

## 2018-07-21 ENCOUNTER — Other Ambulatory Visit: Payer: Medicare Other

## 2018-07-21 DIAGNOSIS — R197 Diarrhea, unspecified: Secondary | ICD-10-CM | POA: Diagnosis not present

## 2018-07-21 LAB — C.DIFFICILE TOXIN: C. Difficile Toxin A: NOT DETECTED

## 2018-07-22 DIAGNOSIS — N186 End stage renal disease: Secondary | ICD-10-CM | POA: Diagnosis not present

## 2018-07-22 DIAGNOSIS — N2581 Secondary hyperparathyroidism of renal origin: Secondary | ICD-10-CM | POA: Diagnosis not present

## 2018-07-22 DIAGNOSIS — D631 Anemia in chronic kidney disease: Secondary | ICD-10-CM | POA: Diagnosis not present

## 2018-07-22 DIAGNOSIS — D689 Coagulation defect, unspecified: Secondary | ICD-10-CM | POA: Diagnosis not present

## 2018-07-22 DIAGNOSIS — R52 Pain, unspecified: Secondary | ICD-10-CM | POA: Diagnosis not present

## 2018-07-22 DIAGNOSIS — D509 Iron deficiency anemia, unspecified: Secondary | ICD-10-CM | POA: Diagnosis not present

## 2018-07-23 ENCOUNTER — Telehealth: Payer: Self-pay

## 2018-07-23 NOTE — Telephone Encounter (Signed)
Angie, RN from [facility] called stating pt wife didn't allow the visit today.

## 2018-07-24 ENCOUNTER — Other Ambulatory Visit (HOSPITAL_COMMUNITY): Payer: Self-pay | Admitting: Physical Medicine and Rehabilitation

## 2018-07-24 DIAGNOSIS — D689 Coagulation defect, unspecified: Secondary | ICD-10-CM | POA: Diagnosis not present

## 2018-07-24 DIAGNOSIS — N2581 Secondary hyperparathyroidism of renal origin: Secondary | ICD-10-CM | POA: Diagnosis not present

## 2018-07-24 DIAGNOSIS — D631 Anemia in chronic kidney disease: Secondary | ICD-10-CM | POA: Diagnosis not present

## 2018-07-24 DIAGNOSIS — R52 Pain, unspecified: Secondary | ICD-10-CM | POA: Diagnosis not present

## 2018-07-24 DIAGNOSIS — N186 End stage renal disease: Secondary | ICD-10-CM | POA: Diagnosis not present

## 2018-07-24 DIAGNOSIS — D509 Iron deficiency anemia, unspecified: Secondary | ICD-10-CM | POA: Diagnosis not present

## 2018-07-25 ENCOUNTER — Other Ambulatory Visit (HOSPITAL_COMMUNITY): Payer: Self-pay | Admitting: Physical Medicine and Rehabilitation

## 2018-07-26 ENCOUNTER — Ambulatory Visit: Payer: Medicare Other | Admitting: Podiatry

## 2018-07-26 LAB — GASTROINTESTINAL PATHOGEN PANEL PCR
C. difficile Tox A/B, PCR: NOT DETECTED
CRYPTOSPORIDIUM, PCR: NOT DETECTED
Campylobacter, PCR: NOT DETECTED
E coli (ETEC) LT/ST PCR: NOT DETECTED
E coli (STEC) stx1/stx2, PCR: NOT DETECTED
E coli 0157, PCR: NOT DETECTED
Giardia lamblia, PCR: NOT DETECTED
Norovirus, PCR: NOT DETECTED
Rotavirus A, PCR: NOT DETECTED
Salmonella, PCR: NOT DETECTED
Shigella, PCR: NOT DETECTED

## 2018-07-26 LAB — FECAL LACTOFERRIN, QUANT
Fecal Lactoferrin: POSITIVE — AB
MICRO NUMBER:: 59256
SPECIMEN QUALITY:: ADEQUATE

## 2018-07-27 ENCOUNTER — Telehealth (HOSPITAL_COMMUNITY): Payer: Self-pay

## 2018-07-27 DIAGNOSIS — N186 End stage renal disease: Secondary | ICD-10-CM | POA: Diagnosis not present

## 2018-07-27 DIAGNOSIS — D509 Iron deficiency anemia, unspecified: Secondary | ICD-10-CM | POA: Diagnosis not present

## 2018-07-27 DIAGNOSIS — D631 Anemia in chronic kidney disease: Secondary | ICD-10-CM | POA: Diagnosis not present

## 2018-07-27 DIAGNOSIS — D689 Coagulation defect, unspecified: Secondary | ICD-10-CM | POA: Diagnosis not present

## 2018-07-27 DIAGNOSIS — G4733 Obstructive sleep apnea (adult) (pediatric): Secondary | ICD-10-CM | POA: Diagnosis not present

## 2018-07-27 DIAGNOSIS — N2581 Secondary hyperparathyroidism of renal origin: Secondary | ICD-10-CM | POA: Diagnosis not present

## 2018-07-27 DIAGNOSIS — R52 Pain, unspecified: Secondary | ICD-10-CM | POA: Diagnosis not present

## 2018-07-27 NOTE — Telephone Encounter (Signed)
Called to schedule f/u, no answer, left vm. AW 

## 2018-07-28 ENCOUNTER — Emergency Department (HOSPITAL_BASED_OUTPATIENT_CLINIC_OR_DEPARTMENT_OTHER)
Admission: EM | Admit: 2018-07-28 | Discharge: 2018-07-28 | Disposition: A | Discharge status: Home / Self Care | Payer: Medicare Other | Attending: Emergency Medicine | Admitting: Emergency Medicine

## 2018-07-28 ENCOUNTER — Encounter (HOSPITAL_BASED_OUTPATIENT_CLINIC_OR_DEPARTMENT_OTHER): Payer: Self-pay

## 2018-07-28 ENCOUNTER — Other Ambulatory Visit: Payer: Self-pay

## 2018-07-28 ENCOUNTER — Other Ambulatory Visit: Payer: Medicare Other

## 2018-07-28 ENCOUNTER — Ambulatory Visit: Payer: Self-pay | Admitting: *Deleted

## 2018-07-28 ENCOUNTER — Other Ambulatory Visit (HOSPITAL_COMMUNITY): Payer: Self-pay | Admitting: Interventional Radiology

## 2018-07-28 ENCOUNTER — Telehealth: Payer: Self-pay

## 2018-07-28 DIAGNOSIS — N186 End stage renal disease: Secondary | ICD-10-CM | POA: Insufficient documentation

## 2018-07-28 DIAGNOSIS — J449 Chronic obstructive pulmonary disease, unspecified: Secondary | ICD-10-CM | POA: Diagnosis not present

## 2018-07-28 DIAGNOSIS — I509 Heart failure, unspecified: Secondary | ICD-10-CM | POA: Insufficient documentation

## 2018-07-28 DIAGNOSIS — I132 Hypertensive heart and chronic kidney disease with heart failure and with stage 5 chronic kidney disease, or end stage renal disease: Secondary | ICD-10-CM | POA: Diagnosis not present

## 2018-07-28 DIAGNOSIS — K625 Hemorrhage of anus and rectum: Secondary | ICD-10-CM | POA: Diagnosis not present

## 2018-07-28 DIAGNOSIS — Z79899 Other long term (current) drug therapy: Secondary | ICD-10-CM | POA: Insufficient documentation

## 2018-07-28 DIAGNOSIS — I639 Cerebral infarction, unspecified: Secondary | ICD-10-CM

## 2018-07-28 DIAGNOSIS — Z992 Dependence on renal dialysis: Secondary | ICD-10-CM | POA: Diagnosis not present

## 2018-07-28 DIAGNOSIS — Z7982 Long term (current) use of aspirin: Secondary | ICD-10-CM | POA: Diagnosis not present

## 2018-07-28 DIAGNOSIS — K6289 Other specified diseases of anus and rectum: Secondary | ICD-10-CM | POA: Diagnosis not present

## 2018-07-28 DIAGNOSIS — Z87891 Personal history of nicotine dependence: Secondary | ICD-10-CM | POA: Insufficient documentation

## 2018-07-28 LAB — CBC WITH DIFFERENTIAL/PLATELET
ABS IMMATURE GRANULOCYTES: 0.12 10*3/uL — AB (ref 0.00–0.07)
Basophils Absolute: 0.1 10*3/uL (ref 0.0–0.1)
Basophils Relative: 1 %
Eosinophils Absolute: 0.4 10*3/uL (ref 0.0–0.5)
Eosinophils Relative: 3 %
HCT: 31 % — ABNORMAL LOW (ref 39.0–52.0)
HEMOGLOBIN: 9.1 g/dL — AB (ref 13.0–17.0)
Immature Granulocytes: 1 %
Lymphocytes Relative: 10 %
Lymphs Abs: 1.1 10*3/uL (ref 0.7–4.0)
MCH: 28.3 pg (ref 26.0–34.0)
MCHC: 29.4 g/dL — ABNORMAL LOW (ref 30.0–36.0)
MCV: 96.6 fL (ref 80.0–100.0)
Monocytes Absolute: 1.1 10*3/uL — ABNORMAL HIGH (ref 0.1–1.0)
Monocytes Relative: 10 %
NEUTROS ABS: 8.1 10*3/uL — AB (ref 1.7–7.7)
NEUTROS PCT: 75 %
Platelets: 377 10*3/uL (ref 150–400)
RBC: 3.21 MIL/uL — ABNORMAL LOW (ref 4.22–5.81)
RDW: 17.7 % — ABNORMAL HIGH (ref 11.5–15.5)
WBC: 10.8 10*3/uL — ABNORMAL HIGH (ref 4.0–10.5)
nRBC: 0 % (ref 0.0–0.2)

## 2018-07-28 LAB — COMPREHENSIVE METABOLIC PANEL
ALK PHOS: 149 U/L — AB (ref 38–126)
ALT: 18 U/L (ref 0–44)
AST: 23 U/L (ref 15–41)
Albumin: 3.3 g/dL — ABNORMAL LOW (ref 3.5–5.0)
Anion gap: 11 (ref 5–15)
BUN: 20 mg/dL (ref 8–23)
CHLORIDE: 96 mmol/L — AB (ref 98–111)
CO2: 29 mmol/L (ref 22–32)
Calcium: 9.9 mg/dL (ref 8.9–10.3)
Creatinine, Ser: 5.49 mg/dL — ABNORMAL HIGH (ref 0.61–1.24)
GFR calc Af Amer: 11 mL/min — ABNORMAL LOW (ref >60)
GFR calc non Af Amer: 10 mL/min — ABNORMAL LOW (ref >60)
Glucose, Bld: 94 mg/dL (ref 70–99)
Potassium: 3.3 mmol/L — ABNORMAL LOW (ref 3.5–5.1)
Sodium: 136 mmol/L (ref 135–145)
Total Bilirubin: 0.6 mg/dL (ref 0.3–1.2)
Total Protein: 7.7 g/dL (ref 6.5–8.1)

## 2018-07-28 LAB — OCCULT BLOOD X 1 CARD TO LAB, STOOL: Fecal Occult Bld: POSITIVE — AB

## 2018-07-28 NOTE — Telephone Encounter (Signed)
Pt reports blood in stool, noted Monday. States in HD yesterday "The doctor told me I was loosing blood and asked me if I was bleeding anywhere,I told him in my BMs." " States dark red, occurs with each BM. BMs are loose, reports 4 during the night, 2 this AM, blood with each BM. States "Can't really tell how much." Reports turns commode water red and has noted "some" clots. Also reports intermittent dizziness, "Lightheaded." Pt is on Brilinta. Saw L. Valere Dross 07/20/2018, diarrhea. States "Medication doesn't work" (lomotil)  Pt directed to ED. Pt asked TN to tell his wife. Wife states he would have to wait too long, wishes appt at practice. Reiterated need for ED disposition, declines.  Wife wishes to speak with PCP. Please advise: [phone]   Reason for Disposition . Taking Coumadin (warfarin) or other strong blood thinner, or known bleeding disorder (e.g., thrombocytopenia)  Answer Assessment - Initial Assessment Questions 1. APPEARANCE of BLOOD: "What color is it?" "Is it passed separately, on the surface of the stool, or mixed in with the stool?"      On surface of stool, dark red with "Some clots" 2. AMOUNT: "How much blood was passed?"      Unsure 3. FREQUENCY: "How many times has blood been passed with the stools?"      With each BM, 6 since last night 4. ONSET: "When was the blood first seen in the stools?" (Days or weeks)      Monday 5. DIARRHEA: "Is there also some diarrhea?" If so, ask: "How many diarrhea stools were passed in past 24 hours?"     Yes, 6 times since last night 6. CONSTipation       no 7. RECURRENT SYMPTOMS: "Have you had blood in your stools before?" If so, ask: "When was the last time?" and "What happened that time?"      no 8. BLOOD THINNERS: "Do you take any blood thinners?" (e.g., Coumadin/warfarin, Pradaxa/dabigatran, aspirin)    Brininta  9. OTHER SYMPTOMS: "Do you have any other symptoms?"  (e.g., abdominal pain, vomiting, dizziness, fever)     Intermittent  dizziness  Protocols used: RECTAL BLEEDING-A-AH

## 2018-07-28 NOTE — Telephone Encounter (Signed)
Returned TC from pt about needing number to reschedule CT appointment due to having dialysis already on the same day. Gave Pt number to call to reschedule his appointment [phone]). No further problems or concerns at this time.

## 2018-07-28 NOTE — ED Notes (Signed)
ED Provider at bedside. 

## 2018-07-28 NOTE — ED Provider Notes (Signed)
[facility] EMERGENCY DEPARTMENT Provider Note   CSN: [id number] Arrival date & time: 07/28/18  1614     History   Chief Complaint Chief Complaint  Patient presents with  . Rectal Bleeding    HPI Jason [name] Sr. is a 73 y.o. male.  HPI   Reports diarrhea for a few weeks At dialysis yesterday, the kidney doctor said it looks like your losing blood by your lab work Yesterday morning did have some blood in the stool . Reports dark red blood, had bowel movement that was normal color then passed dark red blood, clots, one clot about 3cm diameter. Reports had similar bleeding all of last week.  Diarrhea has been going on for weeks, every time eat or drink has diarrhea.    Saw PCP and had infectious work up without sign of infectious etiology of diarrhea and was referred to GI. Has seen [facility] in the past.  Has had diarrhea ever since he had the stroke and was in the hospital.  Diarrhea started before dialysis.  Takes brilinta  Tried lomotil, fibercon  Hx of rectal cancer in 2014, had surgery, saw Dr. Learta Codding, reversed the ostomy at [facility]  Reports rectal pain now, reports diarrhea 5-10 times daily No abdominal pain No nausea or vomiting, no fevers Still making urine, looks different  Reports had rectal bleeding all last week, dark red blood, with the diarrhea, but not mixed in.   Past Medical History:  Diagnosis Date  . Anemia   . Arthritis   . Chronic renal insufficiency 07/31/2011  . COPD (chronic obstructive pulmonary disease) ([place])    pt denies  . Deficiency anemia 07/20/2012  . Diverticulosis   . Edema 09/21/2010   Qualifier: Diagnosis of  By: Jerold Coombe    . ERECTILE DYSFUNCTION, MILD 05/11/2007   Qualifier: Diagnosis of  By: Sherren Mocha MD, Jory Ee   . Exertional dyspnea 10/30/2015  . GOUT 05/11/2007   Qualifier: Diagnosis of  By: Sherren Mocha MD, Jory Ee   . Hereditary and idiopathic peripheral neuropathy 09/14/2014  . History of radiation therapy  02/21/13-03/31/13   rectum 50.4Gy total dose  . Hypersomnia 10/30/2015  . Hypertension   . Lung nodule 10/30/2015  . OAB (overactive bladder) 01/09/2016  . Obesity (BMI 30.0-34.9) 10/30/2015  . Obstructive sleep apnea 07/29/2010   HST 11/2015 AHI 54.  On autocpap  >> 10 cm   . Peripheral edema 09/19/2013  . rectal ca dx'd 02/01/13   rectal. Radiation and chemotherapy- remains on chemotherapy-next tx. 08-22-13.   . S/P partial lobectomy of lung 11/20/2015  . S/P thoracotomy   . SUI (stress urinary incontinence), male 01/09/2016    Patient Active Problem List   Diagnosis Date Noted  . Functional diarrhea 07/14/2018  . Thrombocytopenia ([place]) 06/29/2018  . Benign essential HTN   . Hyperglycemia   . Anemia of chronic disease   . ESRD on dialysis ([place]) 06/16/2018  . Mild malnutrition ([place]) 06/16/2018  . Stroke due to embolism ([place]) 06/16/2018  . Acute ischemic stroke ([place]) 06/08/2018  . CHF (congestive heart failure) ([place]) 05/08/2018  . Normocytic anemia 05/08/2018  . Hyperlipidemia LDL goal <70 09/23/2017  . OAB (overactive bladder) 01/09/2016  . SUI (stress urinary incontinence), male 01/09/2016  . Hypersomnia 10/30/2015  . Obesity (BMI 30.0-34.9) 10/30/2015  . Routine general medical examination at a health care facility 06/04/2015  . Medicare annual wellness visit, subsequent 06/04/2015  . Hereditary and idiopathic peripheral neuropathy 09/14/2014  . Rectal cancer (  [place]) 02/01/2013  . Obstructive sleep apnea 07/29/2010  . GOUT 05/11/2007  . ERECTILE DYSFUNCTION, MILD 05/11/2007  . Essential hypertension 05/11/2007    Past Surgical History:  Procedure Laterality Date  . A/V FISTULAGRAM Left 06/14/2018   Procedure: A/V FISTULAGRAM;  Surgeon: Waynetta Sandy, MD;  Location: [facility];  Service: Cardiovascular;  Laterality: Left;  . AV FISTULA PLACEMENT Left 03/24/2016   Procedure: CREATION OF LEFT BRACIOCEPHALIC ARTERIOVENOUS (AV) FISTULA;  Surgeon: Elam Dutch, MD;   Location: [facility];  Service: Vascular;  Laterality: Left;  . COLON SURGERY  05/20/2013  . COLONOSCOPY  2010   [facility]  . EUS N/A 02/03/2013   Procedure: LOWER ENDOSCOPIC ULTRASOUND (EUS);  Surgeon: Milus Banister, MD;  Location: [facility];  Service: Endoscopy;  Laterality: N/A;  . ILEOSTOMY CLOSURE N/A 08/24/2013   Procedure: CLOSURE OF LOOP ILEOSTOMY ;  Surgeon: Leighton Ruff, MD;  Location: [facility];  Service: General;  Laterality: N/A;  . IR ANGIO VERTEBRAL SEL SUBCLAVIAN INNOMINATE BILAT MOD SED  06/08/2018  . IR CT HEAD LTD  06/08/2018  . IR INTRAVSC STENT CERV CAROTID W/O EMB-PROT MOD SED INC ANGIO  06/08/2018  . IR PERCUTANEOUS ART THROMBECTOMY/INFUSION INTRACRANIAL INC DIAG ANGIO  06/08/2018  . LAPAROSCOPIC LOW ANTERIOR RESECTION N/A 05/20/2013   Procedure: LAPAROSCOPIC LOW ANTERIOR RESECTION WITH SPLENIC FLEXURE MOBILIZATION;  Surgeon: Leighton Ruff, MD;  Location: [facility];  Service: General;  Laterality: N/A;  . OSTOMY N/A 05/20/2013   Procedure: diverting OSTOMY;  Surgeon: Leighton Ruff, MD;  Location: [facility];  Service: General;  Laterality: N/A;  . PERIPHERAL VASCULAR INTERVENTION Left 06/14/2018   Procedure: PERIPHERAL VASCULAR INTERVENTION;  Surgeon: Waynetta Sandy, MD;  Location: [facility];  Service: Cardiovascular;  Laterality: Left;  AVF on the Left  . RADIOLOGY WITH ANESTHESIA N/A 06/08/2018   Procedure: IR WITH ANESTHESIA;  Surgeon: Radiologist, Medication, MD;  Location: [facility];  Service: Radiology;  Laterality: N/A;  . VIDEO ASSISTED THORACOSCOPY (VATS)/WEDGE RESECTION Left 11/20/2015   Procedure: VIDEO ASSISTED THORACOSCOPY (VATS)/LUNG RESECTION;  Surgeon: [name], MD;  Location: [facility];  Service: Thoracic;  Laterality: Left;  Marland Kitchen VIDEO BRONCHOSCOPY N/A 11/20/2015   Procedure: VIDEO BRONCHOSCOPY;  Surgeon: [name], MD;  Location[facility];  Service: Thoracic;  Laterality: N/A;        Home Medications    Prior to Admission medications     Medication Sig Start Date End Date Taking? Authorizing Provider  allopurinol (ZYLOPRIM) 100 MG tablet Take 1 tablet (100 mg total) by mouth daily. 06/29/18  Yes Love, Ivan Anchors, PA-C  aspirin 81 MG chewable tablet Chew 1 tablet (81 mg total) by mouth daily. 06/29/18  Yes Love, Ivan Anchors, PA-C  atorvastatin (LIPITOR) 20 MG tablet Take 1 tablet (20 mg total) by mouth daily at 6 PM. 07/20/18  Yes Marrian Salvage, FNP  BRILINTA 90 MG TABS tablet TAKE 1 TABLET BY MOUTH TWICE DAILY 07/26/18  Yes [name], MD  calcitRIOL (ROCALTROL) 0.25 MCG capsule TAKE 1 CAPSULE BY MOUTH ONCE DAILY 07/26/18  Yes [name], MD  cholecalciferol (VITAMIN D) 1000 units tablet Take 5,000 Units by mouth daily.   Yes [provider]  diphenoxylate-atropine (LOMOTIL) 2.5-0.025 MG tablet Take 1 tablet by mouth 4 (four) times daily as needed for diarrhea or loose stools. 07/20/18  Yes Marrian Salvage, FNP  gabapentin (NEURONTIN) 300 MG capsule Take 1 capsule (300 mg total) by mouth at  bedtime. 06/29/18  Yes Love, Ivan Anchors, PA-C  multivitamin (RENA-VIT) TABS tablet Take 1 tablet by mouth at bedtime. 06/29/18  Yes Love, Ivan Anchors, PA-C  pantoprazole (PROTONIX) 40 MG tablet TAKE 1 TABLET BY MOUTH ONCE DAILY 07/26/18  Yes [name], MD  polycarbophil (FIBERCON) 625 MG tablet Take 2 tablets (1,250 mg total) by mouth 2 (two) times daily. 06/29/18  Yes Love, Ivan Anchors, PA-C  saccharomyces boulardii (FLORASTOR) 250 MG capsule Take 1 capsule (250 mg total) by mouth 2 (two) times daily. 06/29/18  Yes Love, Ivan Anchors, PA-C  acetaminophen (TYLENOL) 325 MG tablet Take 1-2 tablets (325-650 mg total) by mouth every 4 (four) hours as needed for mild pain. 06/29/18   Love, Ivan Anchors, PA-C  traMADol (ULTRAM) 50 MG tablet Take 1 tablet (50 mg total) by mouth every 12 (twelve) hours as needed for moderate pain. 06/29/18   Bary Leriche, PA-C    Family History Family History  Problem Relation Age of Onset  . Stroke  Mother   . Hypertension Mother   . Stroke Father   . Hypertension Father   . Hypertension Other   . Colon cancer Neg Hx     Social History Social History   Tobacco Use  . Smoking status: Former Smoker    Packs/day: 0.50    Years: 10.00    Pack years: 5.00    Types: Cigarettes    Last attempt to quit: 07/07/1977    Years since quitting: 41.0  . Smokeless tobacco: Never Used  Substance Use Topics  . Alcohol use: Yes    Alcohol/week: 0.0 standard drinks    Comment: drinks occasionally  . Drug use: No     Allergies   Nsaids; Lactose intolerance (gi); and Amlodipine   Review of Systems Review of Systems  Constitutional: Positive for fatigue. Negative for fever.  HENT: Negative for sore throat.   Eyes: Negative for visual disturbance.  Respiratory: Negative for shortness of breath.   Cardiovascular: Negative for chest pain.  Gastrointestinal: Positive for anal bleeding and diarrhea. Negative for abdominal pain, nausea and vomiting.  Genitourinary: Negative for difficulty urinating.  Musculoskeletal: Negative for back pain and neck stiffness.  Skin: Negative for rash.  Neurological: Positive for light-headedness (at dialysis). Negative for syncope and headaches.     Physical Exam Updated Vital Signs BP (!) 144/70 (BP Location: Right Arm)   Pulse (!) 102   Temp 98.6 F (37 C) (Oral)   Resp 16   Ht 5\' 8"  (1.727 m)   Wt 73.9 kg   SpO2 100%   BMI 24.78 kg/m   Physical Exam Vitals signs and nursing note reviewed.  Constitutional:      General: He is not in acute distress.    Appearance: He is well-developed. He is not diaphoretic.  HENT:     Head: Normocephalic and atraumatic.  Eyes:     Conjunctiva/sclera: Conjunctivae normal.  Neck:     Musculoskeletal: Normal range of motion.  Cardiovascular:     Rate and Rhythm: Normal rate and regular rhythm.     Heart sounds: Normal heart sounds. No murmur. No friction rub. No gallop.   Pulmonary:     Effort:  Pulmonary effort is normal. No respiratory distress.     Breath sounds: Normal breath sounds. No wheezing or rales.  Abdominal:     General: There is no distension.     Palpations: Abdomen is soft.     Tenderness: There is no abdominal tenderness.  There is no guarding.  Genitourinary:    Rectum: Tenderness present. No external hemorrhoid. Abnormal anal tone (high tone).     Comments: Brown stool Skin:    General: Skin is warm and dry.  Neurological:     Mental Status: He is alert and oriented to person, place, and time.      ED Treatments / Results  Labs (all labs ordered are listed, but only abnormal results are displayed) Labs Reviewed  CBC WITH DIFFERENTIAL/PLATELET - Abnormal; Notable for the following components:      Result Value   WBC 10.8 (*)    RBC 3.21 (*)    Hemoglobin 9.1 (*)    HCT 31.0 (*)    MCHC 29.4 (*)    RDW 17.7 (*)    Neutro Abs 8.1 (*)    Monocytes Absolute 1.1 (*)    Abs Immature Granulocytes 0.12 (*)    All other components within normal limits  COMPREHENSIVE METABOLIC PANEL - Abnormal; Notable for the following components:   Potassium 3.3 (*)    Chloride 96 (*)    Creatinine, Ser 5.49 (*)    Albumin 3.3 (*)    Alkaline Phosphatase 149 (*)    GFR calc non Af Amer 10 (*)    GFR calc Af Amer 11 (*)    All other components within normal limits  OCCULT BLOOD X 1 CARD TO LAB, STOOL - Abnormal; Notable for the following components:   Fecal Occult Bld POSITIVE (*)    All other components within normal limits    EKG None  Radiology No results found.  Procedures Procedures (including critical care time)  Medications Ordered in ED Medications - No data to display   Initial Impression / Assessment and Plan / ED Course  I have reviewed the triage vital signs and the nursing notes.  Pertinent labs & imaging results that were available during my care of the patient were reviewed by me and considered in my medical decision making (see chart for  details).     73yo male with medical history above including recent CVA, on brilinta, recent dialysis initiation, presents with concern for diarrhea and rectal bleeding.  Reviewed records from PCP, he has been seen for diarrhea, had infectious work up which was negative.  Abdominal exam is benign.  In absence of pain doubt ischemic colitis.    Regarding rectal bleeding--he has no hypotension, stable hemoglobin, and light brown stool on exam and doubt clinically significant bleed that would require inpatient admission. Bleeding may be in setting of diarrhea from anal fissure given high rectal tone, internal hemorrhoids, however may also represent other etiology of lower GI bleed.  Recommend urgent follow up with GI given symptoms as well as hx of rectal cancer. Discussed with GI provider on call and recommend pt call the office for close appointment. Pt reported he sees [facility] and initially discussed with that provider however pt actually goes to [facility] and spoke with [facility] physician on call. Patient discharged in stable condition with understanding of reasons to return. Discussed with patient given hemoglobin stable after 1 wk of symptoms I feel it is best to continue his regular brilinta rx.   Final Clinical Impressions(s) / ED Diagnoses   Final diagnoses:  Rectal bleeding  Rectal pain    ED Discharge Orders    None       Gareth Morgan, MD 07/29/18 1456

## 2018-07-28 NOTE — Telephone Encounter (Signed)
I have talked with patient's wife---wife states kidney dialysis center did lab work and reported to patient that they are seeing signs of bleeding--patient continues to be lightheaded and dizzy, I have advised that patient can go to ED at [facility], typicallly wait is minimal and if he needs to be admitted, they can transport him to either [facility] or [facility] for inpatient room---but if his labs are showing his hemoglobin to be low---its possibly he will need to get blood and the only place that can be done would be at ED or hospital---I have advised that patient stop taking lomotil since it is not working anyway.  Also advised that patient will probably need GI referral, specialist to figure out source of bleeding, patient's wife can seek  ED advisement first and then call back to our office for referral if needed.  Wife was given address to [facility], she will take patient there now--can talk with Jason [name] if any further information is needed

## 2018-07-28 NOTE — ED Triage Notes (Signed)
Pt c/o bloody stools x 1 month-NAD-to triage in w/c

## 2018-07-29 DIAGNOSIS — D689 Coagulation defect, unspecified: Secondary | ICD-10-CM | POA: Diagnosis not present

## 2018-07-29 DIAGNOSIS — D631 Anemia in chronic kidney disease: Secondary | ICD-10-CM | POA: Diagnosis not present

## 2018-07-29 DIAGNOSIS — N186 End stage renal disease: Secondary | ICD-10-CM | POA: Diagnosis not present

## 2018-07-29 DIAGNOSIS — I639 Cerebral infarction, unspecified: Secondary | ICD-10-CM | POA: Diagnosis not present

## 2018-07-29 DIAGNOSIS — N2581 Secondary hyperparathyroidism of renal origin: Secondary | ICD-10-CM | POA: Diagnosis not present

## 2018-07-29 DIAGNOSIS — D509 Iron deficiency anemia, unspecified: Secondary | ICD-10-CM | POA: Diagnosis not present

## 2018-07-29 DIAGNOSIS — R52 Pain, unspecified: Secondary | ICD-10-CM | POA: Diagnosis not present

## 2018-07-30 ENCOUNTER — Ambulatory Visit (INDEPENDENT_AMBULATORY_CARE_PROVIDER_SITE_OTHER): Payer: Self-pay

## 2018-07-30 ENCOUNTER — Encounter: Payer: Self-pay | Admitting: Podiatry

## 2018-07-30 ENCOUNTER — Ambulatory Visit: Payer: Medicare Other | Admitting: Podiatry

## 2018-07-30 ENCOUNTER — Ambulatory Visit: Payer: Self-pay | Admitting: Podiatry

## 2018-07-30 ENCOUNTER — Other Ambulatory Visit: Payer: Self-pay | Admitting: Podiatry

## 2018-07-30 DIAGNOSIS — M79672 Pain in left foot: Secondary | ICD-10-CM | POA: Diagnosis not present

## 2018-07-30 DIAGNOSIS — M109 Gout, unspecified: Secondary | ICD-10-CM | POA: Diagnosis not present

## 2018-07-30 DIAGNOSIS — S90822A Blister (nonthermal), left foot, initial encounter: Secondary | ICD-10-CM | POA: Diagnosis not present

## 2018-07-30 DIAGNOSIS — Z95828 Presence of other vascular implants and grafts: Secondary | ICD-10-CM | POA: Diagnosis not present

## 2018-07-30 DIAGNOSIS — I999 Unspecified disorder of circulatory system: Secondary | ICD-10-CM

## 2018-07-30 DIAGNOSIS — L089 Local infection of the skin and subcutaneous tissue, unspecified: Secondary | ICD-10-CM

## 2018-07-30 DIAGNOSIS — Z87891 Personal history of nicotine dependence: Secondary | ICD-10-CM | POA: Diagnosis not present

## 2018-07-30 DIAGNOSIS — J449 Chronic obstructive pulmonary disease, unspecified: Secondary | ICD-10-CM | POA: Diagnosis not present

## 2018-07-30 DIAGNOSIS — E211 Secondary hyperparathyroidism, not elsewhere classified: Secondary | ICD-10-CM | POA: Diagnosis not present

## 2018-07-30 DIAGNOSIS — Z9689 Presence of other specified functional implants: Secondary | ICD-10-CM | POA: Diagnosis not present

## 2018-07-30 DIAGNOSIS — Z7902 Long term (current) use of antithrombotics/antiplatelets: Secondary | ICD-10-CM | POA: Diagnosis not present

## 2018-07-30 DIAGNOSIS — G4733 Obstructive sleep apnea (adult) (pediatric): Secondary | ICD-10-CM | POA: Diagnosis not present

## 2018-07-30 DIAGNOSIS — I420 Dilated cardiomyopathy: Secondary | ICD-10-CM | POA: Diagnosis not present

## 2018-07-30 DIAGNOSIS — N186 End stage renal disease: Secondary | ICD-10-CM | POA: Diagnosis not present

## 2018-07-30 DIAGNOSIS — I69322 Dysarthria following cerebral infarction: Secondary | ICD-10-CM | POA: Diagnosis not present

## 2018-07-30 DIAGNOSIS — I272 Pulmonary hypertension, unspecified: Secondary | ICD-10-CM | POA: Diagnosis not present

## 2018-07-30 DIAGNOSIS — I130 Hypertensive heart and chronic kidney disease with heart failure and stage 1 through stage 4 chronic kidney disease, or unspecified chronic kidney disease: Secondary | ICD-10-CM | POA: Diagnosis not present

## 2018-07-30 DIAGNOSIS — I69354 Hemiplegia and hemiparesis following cerebral infarction affecting left non-dominant side: Secondary | ICD-10-CM | POA: Diagnosis not present

## 2018-07-30 DIAGNOSIS — D631 Anemia in chronic kidney disease: Secondary | ICD-10-CM | POA: Diagnosis not present

## 2018-07-30 DIAGNOSIS — I69318 Other symptoms and signs involving cognitive functions following cerebral infarction: Secondary | ICD-10-CM | POA: Diagnosis not present

## 2018-07-30 DIAGNOSIS — Z992 Dependence on renal dialysis: Secondary | ICD-10-CM | POA: Diagnosis not present

## 2018-07-30 DIAGNOSIS — M1990 Unspecified osteoarthritis, unspecified site: Secondary | ICD-10-CM | POA: Diagnosis not present

## 2018-07-30 DIAGNOSIS — C20 Malignant neoplasm of rectum: Secondary | ICD-10-CM | POA: Diagnosis not present

## 2018-07-30 DIAGNOSIS — I5022 Chronic systolic (congestive) heart failure: Secondary | ICD-10-CM | POA: Diagnosis not present

## 2018-07-31 ENCOUNTER — Other Ambulatory Visit (HOSPITAL_COMMUNITY): Payer: Self-pay | Admitting: Physical Medicine and Rehabilitation

## 2018-07-31 DIAGNOSIS — D631 Anemia in chronic kidney disease: Secondary | ICD-10-CM | POA: Diagnosis not present

## 2018-07-31 DIAGNOSIS — D509 Iron deficiency anemia, unspecified: Secondary | ICD-10-CM | POA: Diagnosis not present

## 2018-07-31 DIAGNOSIS — R52 Pain, unspecified: Secondary | ICD-10-CM | POA: Diagnosis not present

## 2018-07-31 DIAGNOSIS — N2581 Secondary hyperparathyroidism of renal origin: Secondary | ICD-10-CM | POA: Diagnosis not present

## 2018-07-31 DIAGNOSIS — N186 End stage renal disease: Secondary | ICD-10-CM | POA: Diagnosis not present

## 2018-07-31 DIAGNOSIS — D689 Coagulation defect, unspecified: Secondary | ICD-10-CM | POA: Diagnosis not present

## 2018-08-03 DIAGNOSIS — R52 Pain, unspecified: Secondary | ICD-10-CM | POA: Diagnosis not present

## 2018-08-03 DIAGNOSIS — D631 Anemia in chronic kidney disease: Secondary | ICD-10-CM | POA: Diagnosis not present

## 2018-08-03 DIAGNOSIS — N2581 Secondary hyperparathyroidism of renal origin: Secondary | ICD-10-CM | POA: Diagnosis not present

## 2018-08-03 DIAGNOSIS — N186 End stage renal disease: Secondary | ICD-10-CM | POA: Diagnosis not present

## 2018-08-03 DIAGNOSIS — D689 Coagulation defect, unspecified: Secondary | ICD-10-CM | POA: Diagnosis not present

## 2018-08-03 DIAGNOSIS — D509 Iron deficiency anemia, unspecified: Secondary | ICD-10-CM | POA: Diagnosis not present

## 2018-08-05 DIAGNOSIS — D509 Iron deficiency anemia, unspecified: Secondary | ICD-10-CM | POA: Diagnosis not present

## 2018-08-05 DIAGNOSIS — R52 Pain, unspecified: Secondary | ICD-10-CM | POA: Diagnosis not present

## 2018-08-05 DIAGNOSIS — D631 Anemia in chronic kidney disease: Secondary | ICD-10-CM | POA: Diagnosis not present

## 2018-08-05 DIAGNOSIS — N186 End stage renal disease: Secondary | ICD-10-CM | POA: Diagnosis not present

## 2018-08-05 DIAGNOSIS — N2581 Secondary hyperparathyroidism of renal origin: Secondary | ICD-10-CM | POA: Diagnosis not present

## 2018-08-05 DIAGNOSIS — D689 Coagulation defect, unspecified: Secondary | ICD-10-CM | POA: Diagnosis not present

## 2018-08-05 NOTE — Progress Notes (Signed)
Subjective:   Patient ID: Jason Cotta Sr., male   DOB: 73 y.o.   MRN: [id number]   HPI Patient presents stating that he has had pain in his left heel that he wanted to have checked and while he is not had drainage he is had long-term history of healing issues and wants to make sure everything is okay   ROS      Objective:  Physical Exam  Neurovascular status unchanged with patient found to have small blister medial side left ankle that is localized with no drainage or other erythema or pathology noted.  It is localized to this area     Assessment:  Localized blister contusion formation left with no current indication of infection     Plan:  I went ahead today and I explained leaving the blister intact and if any redness or drainage or pathology were to occur to let us know immediately and I did apply Silvadene to the area and then laced him into a surgical shoe to reduce pressure against the area.  Patient will be seen back for [place] to recheck and was given strict instructions to call us immediately if any changes should occur  X-rays were negative for lysis or indications of pathology with multiple signs of arthritis and osteoporosis

## 2018-08-06 ENCOUNTER — Other Ambulatory Visit (INDEPENDENT_AMBULATORY_CARE_PROVIDER_SITE_OTHER): Payer: Medicare Other

## 2018-08-06 ENCOUNTER — Encounter: Payer: Self-pay | Admitting: Internal Medicine

## 2018-08-06 ENCOUNTER — Ambulatory Visit (INDEPENDENT_AMBULATORY_CARE_PROVIDER_SITE_OTHER): Payer: Medicare Other | Admitting: Internal Medicine

## 2018-08-06 VITALS — BP 104/60 | HR 69 | Temp 98.0°F | Ht 68.0 in

## 2018-08-06 DIAGNOSIS — K591 Functional diarrhea: Secondary | ICD-10-CM

## 2018-08-06 DIAGNOSIS — I10 Essential (primary) hypertension: Secondary | ICD-10-CM | POA: Diagnosis not present

## 2018-08-06 DIAGNOSIS — Z992 Dependence on renal dialysis: Secondary | ICD-10-CM | POA: Diagnosis not present

## 2018-08-06 DIAGNOSIS — D638 Anemia in other chronic diseases classified elsewhere: Secondary | ICD-10-CM

## 2018-08-06 DIAGNOSIS — R739 Hyperglycemia, unspecified: Secondary | ICD-10-CM

## 2018-08-06 DIAGNOSIS — N186 End stage renal disease: Secondary | ICD-10-CM | POA: Diagnosis not present

## 2018-08-06 DIAGNOSIS — I129 Hypertensive chronic kidney disease with stage 1 through stage 4 chronic kidney disease, or unspecified chronic kidney disease: Secondary | ICD-10-CM | POA: Diagnosis not present

## 2018-08-06 LAB — CBC WITH DIFFERENTIAL/PLATELET
Basophils Absolute: 0.1 10*3/uL (ref 0.0–0.1)
Basophils Relative: 0.9 % (ref 0.0–3.0)
EOS ABS: 0.2 10*3/uL (ref 0.0–0.7)
Eosinophils Relative: 2.5 % (ref 0.0–5.0)
HCT: 28.4 % — ABNORMAL LOW (ref 39.0–52.0)
Hemoglobin: 9.2 g/dL — ABNORMAL LOW (ref 13.0–17.0)
Lymphocytes Relative: 9.6 % — ABNORMAL LOW (ref 12.0–46.0)
Lymphs Abs: 0.8 10*3/uL (ref 0.7–4.0)
MCHC: 32.3 g/dL (ref 30.0–36.0)
MCV: 90.8 fl (ref 78.0–100.0)
Monocytes Absolute: 1.2 10*3/uL — ABNORMAL HIGH (ref 0.1–1.0)
Monocytes Relative: 14.9 % — ABNORMAL HIGH (ref 3.0–12.0)
Neutro Abs: 6 10*3/uL (ref 1.4–7.7)
Neutrophils Relative %: 72.1 % (ref 43.0–77.0)
Platelets: 303 10*3/uL (ref 150.0–400.0)
RBC: 3.13 Mil/uL — ABNORMAL LOW (ref 4.22–5.81)
RDW: 18.8 % — ABNORMAL HIGH (ref 11.5–15.5)
WBC: 8.3 10*3/uL (ref 4.0–10.5)

## 2018-08-06 MED ORDER — DIPHENOXYLATE-ATROPINE 2.5-0.025 MG PO TABS: 1.0000 | ORAL_TABLET | Freq: Four times a day (QID) | ORAL | 0 refills | Status: DC | PRN

## 2018-08-06 NOTE — Progress Notes (Signed)
Subjective:    Patient ID: Jason Cotta Sr., male    DOB: [birth year], 73 y.o.   MRN: [id number]  HPI  Here with wife, with concern for recent persistent loose stools, mult per day, assoc with BRB recently but none since seen in ED jan 22; having loose stools but occasionally some watery for unclear reason and more cently sinced asked to stop the lomotil he had been taking;  denies fever, and Denies worsening reflux, abd pain, dysphagia, n/v.  Pt denies chest pain, increased sob or doe, wheezing, orthopnea, PND, increased LE swelling, palpitations, dizziness or syncope.  Pt denies new neurological symptoms such as new headache, or facial or extremity weakness or numbness   Pt denies polydipsia, polyuria.  Cont's with HD.  Recent infectious evaluation per PCP negative with neg GI panel and c diff toxin.  Does not feel acutely ill today Past Medical History:  Diagnosis Date  . Anemia   . Arthritis   . Chronic renal insufficiency 07/31/2011  . COPD (chronic obstructive pulmonary disease) ([place])    pt denies  . Deficiency anemia 07/20/2012  . Diverticulosis   . Edema 09/21/2010   Qualifier: Diagnosis of  By: Jerold Coombe    . ERECTILE DYSFUNCTION, MILD 05/11/2007   Qualifier: Diagnosis of  By: Sherren Mocha MD, Jory Ee   . Exertional dyspnea 10/30/2015  . GOUT 05/11/2007   Qualifier: Diagnosis of  By: Sherren Mocha MD, Jory Ee   . Hereditary and idiopathic peripheral neuropathy 09/14/2014  . History of radiation therapy 02/21/13-03/31/13   rectum 50.4Gy total dose  . Hypersomnia 10/30/2015  . Hypertension   . Lung nodule 10/30/2015  . OAB (overactive bladder) 01/09/2016  . Obesity (BMI 30.0-34.9) 10/30/2015  . Obstructive sleep apnea 07/29/2010   HST 11/2015 AHI 54.  On autocpap  >> 10 cm   . Peripheral edema 09/19/2013  . rectal ca dx'd 02/01/13   rectal. Radiation and chemotherapy- remains on chemotherapy-next tx. 08-22-13.   . S/P partial lobectomy of lung 11/20/2015  . S/P thoracotomy   . SUI (stress urinary  incontinence), male 01/09/2016   Past Surgical History:  Procedure Laterality Date  . A/V FISTULAGRAM Left 06/14/2018   Procedure: A/V FISTULAGRAM;  Surgeon: Waynetta Sandy, MD;  Location: [place] [facility];  Service: Cardiovascular;  Laterality: Left;  . AV FISTULA PLACEMENT Left 03/24/2016   Procedure: CREATION OF LEFT BRACIOCEPHALIC ARTERIOVENOUS (AV) FISTULA;  Surgeon: Elam Dutch, MD;  Location: [place];  Service: Vascular;  Laterality: Left;  . COLON SURGERY  05/20/2013  . COLONOSCOPY  2010   [facility]  . EUS N/A 02/03/2013   Procedure: LOWER ENDOSCOPIC ULTRASOUND (EUS);  Surgeon: Milus Banister, MD;  Location: [facility];  Service: Endoscopy;  Laterality: N/A;  . ILEOSTOMY CLOSURE N/A 08/24/2013   Procedure: CLOSURE OF LOOP ILEOSTOMY ;  Surgeon: Leighton Ruff, MD;  [facility];  Service: General;  Laterality: N/A;  . IR ANGIO VERTEBRAL SEL SUBCLAVIAN INNOMINATE BILAT MOD SED  06/08/2018  . IR CT HEAD LTD  06/08/2018  . IR INTRAVSC STENT CERV CAROTID W/O EMB-PROT MOD SED INC ANGIO  06/08/2018  . IR PERCUTANEOUS ART THROMBECTOMY/INFUSION INTRACRANIAL INC DIAG ANGIO  06/08/2018  . LAPAROSCOPIC LOW ANTERIOR RESECTION N/A 05/20/2013   Procedure: LAPAROSCOPIC LOW ANTERIOR RESECTION WITH SPLENIC FLEXURE MOBILIZATION;  Surgeon: Leighton Ruff, MD;  [facility];  Service: General;  Laterality: N/A;  . OSTOMY N/A 05/20/2013   Procedure: diverting OSTOMY;  Surgeon: Leighton Ruff,  MD;  [facility];  Service: General;  Laterality: N/A;  . PERIPHERAL VASCULAR INTERVENTION Left 06/14/2018   Procedure: PERIPHERAL VASCULAR INTERVENTION;  Surgeon: Waynetta Sandy, MD;  Location: [place] [facility];  Service: Cardiovascular;  Laterality: Left;  AVF on the Left  . RADIOLOGY WITH ANESTHESIA N/A 06/08/2018   Procedure: IR WITH ANESTHESIA;  Surgeon: Radiologist, Medication, MD;  Location: [place];  Service: Radiology;  Laterality: N/A;  . VIDEO ASSISTED THORACOSCOPY  (VATS)/WEDGE RESECTION Left 11/20/2015   Procedure: VIDEO ASSISTED THORACOSCOPY (VATS)/LUNG RESECTION;  Surgeon: [name], MD;  Location: [place];  Service: Thoracic;  Laterality: Left;  Marland Kitchen VIDEO BRONCHOSCOPY N/A 11/20/2015   Procedure: VIDEO BRONCHOSCOPY;  Surgeon: [name], MD;  Location: [place];  Service: Thoracic;  Laterality: N/A;    reports that he quit smoking about 41 years ago. His smoking use included cigarettes. He has a 5.00 pack-year smoking history. He has never used smokeless tobacco. He reports current alcohol use. He reports that he does not use drugs. family history includes Hypertension in his father, mother, and another family member; Stroke in his father and mother. Allergies  Allergen Reactions  . Nsaids Other (See Comments)    Asked by surgeon to add this medication class as intolerance due to patients renal insufficiency.  . Lactose Intolerance (Gi)     Diarrhea   . Amlodipine Other (See Comments)    Edema    Current Outpatient Medications on File Prior to Visit  Medication Sig Dispense Refill  . acetaminophen (TYLENOL) 325 MG tablet Take 1-2 tablets (325-650 mg total) by mouth every 4 (four) hours as needed for mild pain.    Marland Kitchen allopurinol (ZYLOPRIM) 100 MG tablet Take 1 tablet (100 mg total) by mouth daily. 30 tablet 0  . aspirin 81 MG chewable tablet Chew 1 tablet (81 mg total) by mouth daily. 100 tablet 0  . atorvastatin (LIPITOR) 20 MG tablet Take 1 tablet (20 mg total) by mouth daily at 6 PM. 30 tablet 1  . BRILINTA 90 MG TABS tablet TAKE 1 TABLET BY MOUTH TWICE DAILY 60 tablet 0  . calcitRIOL (ROCALTROL) 0.25 MCG capsule TAKE 1 CAPSULE BY MOUTH ONCE DAILY 30 capsule 0  . cholecalciferol (VITAMIN D) 1000 units tablet Take 5,000 Units by mouth daily.    Marland Kitchen gabapentin (NEURONTIN) 300 MG capsule TAKE 1 CAPSULE BY MOUTH EVERY DAY AT BEDTIME. PT NEEDS APPT PRIOR TO FUTURE REFILLS 30 capsule 0  . multivitamin (RENA-VIT) TABS tablet Take 1 tablet by mouth  at bedtime. 30 tablet 0  . pantoprazole (PROTONIX) 40 MG tablet TAKE 1 TABLET BY MOUTH ONCE DAILY 90 tablet 1  . polycarbophil (FIBERCON) 625 MG tablet Take 2 tablets (1,250 mg total) by mouth 2 (two) times daily.    Marland Kitchen saccharomyces boulardii (FLORASTOR) 250 MG capsule Take 1 capsule (250 mg total) by mouth 2 (two) times daily. 100 capsule 0   No current facility-administered medications on file prior to visit.    Review of Systems  Constitutional: Negative for other unusual diaphoresis or sweats HENT: Negative for ear discharge or swelling Eyes: Negative for other worsening visual disturbances Respiratory: Negative for stridor or other swelling  Gastrointestinal: Negative for worsening distension or other blood Genitourinary: Negative for retention or other urinary change Musculoskeletal: Negative for other MSK pain or swelling Skin: Negative for color change or other new lesions Neurological: Negative for worsening tremors and other numbness  Psychiatric/Behavioral: Negative for worsening agitation or other fatigue  All other system neg per pt    Objective:   Physical Exam BP 104/60   Pulse 69   Temp 98 F (36.7 C) (Oral)   Ht 5\' 8"  (1.727 m)   SpO2 99%   BMI 24.78 kg/m  VS noted,  Constitutional: Pt appears in NAD HENT: Head: NCAT.  Right Ear: External ear normal.  Left Ear: External ear normal.  Eyes: . Pupils are equal, round, and reactive to light. Conjunctivae and EOM are normal Nose: without d/c or deformity Neck: Neck supple. Gross normal ROM Cardiovascular: Normal rate and regular rhythm.   Pulmonary/Chest: Effort normal and breath sounds without rales or wheezing.  Abd:  Soft, NT, ND, + BS, no organomegaly Neurological: Pt is alert. At baseline orientation, motor grossly intact Skin: Skin is warm. No rashes, other new lesions, no LE edema Psychiatric: Pt behavior is normal without agitation  No other exam findings . Lab Results  Component Value Date   WBC 8.3  08/06/2018   HGB 9.2 (L) 08/06/2018   HCT 28.4 (L) 08/06/2018   PLT 303.0 08/06/2018   GLUCOSE 94 07/28/2018   CHOL 171 06/09/2018   TRIG 101 06/09/2018   HDL 48 06/09/2018   LDLDIRECT 120.0 06/04/2015   LDLCALC 103 (H) 06/09/2018   ALT 18 07/28/2018   AST 23 07/28/2018   NA 136 07/28/2018   K 3.3 (L) 07/28/2018   CL 96 (L) 07/28/2018   CREATININE 5.49 (H) 07/28/2018   BUN 20 07/28/2018   CO2 29 07/28/2018   TSH 5.41 (H) 09/23/2017   PSA 1.86 08/10/2013   INR 1.06 06/08/2018   HGBA1C 5.5 06/09/2018        Assessment & Plan:

## 2018-08-06 NOTE — Assessment & Plan Note (Signed)
Also for cbc f/u today no recent overt bleeding

## 2018-08-06 NOTE — Patient Instructions (Signed)
Please continue all other medications as before, including the restart of the lomotil as needed  Please have the pharmacy call with any other refills you may need.  Please keep your appointments with your specialists as you may have planned - GI on Feb 4  Please go to the LAB in the Basement (turn left off the elevator) for the tests to be done today - the blood count, as well as the stool culture

## 2018-08-06 NOTE — Assessment & Plan Note (Signed)
stable overall by history and exam, recent data reviewed with pt, and pt to continue medical treatment as before,  to f/u any worsening symptoms or concerns  

## 2018-08-06 NOTE — Assessment & Plan Note (Signed)
Afeb, benign exam, will ask for stool cx, and f/u GI as planned early next wk

## 2018-08-07 DIAGNOSIS — L299 Pruritus, unspecified: Secondary | ICD-10-CM | POA: Diagnosis not present

## 2018-08-07 DIAGNOSIS — D631 Anemia in chronic kidney disease: Secondary | ICD-10-CM | POA: Diagnosis not present

## 2018-08-07 DIAGNOSIS — D509 Iron deficiency anemia, unspecified: Secondary | ICD-10-CM | POA: Diagnosis not present

## 2018-08-07 DIAGNOSIS — N2581 Secondary hyperparathyroidism of renal origin: Secondary | ICD-10-CM | POA: Diagnosis not present

## 2018-08-07 DIAGNOSIS — Z23 Encounter for immunization: Secondary | ICD-10-CM | POA: Diagnosis not present

## 2018-08-07 DIAGNOSIS — D689 Coagulation defect, unspecified: Secondary | ICD-10-CM | POA: Diagnosis not present

## 2018-08-07 DIAGNOSIS — R52 Pain, unspecified: Secondary | ICD-10-CM | POA: Diagnosis not present

## 2018-08-07 DIAGNOSIS — R197 Diarrhea, unspecified: Secondary | ICD-10-CM | POA: Diagnosis not present

## 2018-08-07 DIAGNOSIS — N186 End stage renal disease: Secondary | ICD-10-CM | POA: Diagnosis not present

## 2018-08-08 ENCOUNTER — Other Ambulatory Visit (HOSPITAL_COMMUNITY): Payer: Self-pay | Admitting: Physical Medicine and Rehabilitation

## 2018-08-09 ENCOUNTER — Encounter (HOSPITAL_COMMUNITY): Payer: Self-pay

## 2018-08-09 ENCOUNTER — Other Ambulatory Visit: Payer: Medicare Other

## 2018-08-09 ENCOUNTER — Ambulatory Visit (HOSPITAL_COMMUNITY)
Admission: RE | Admit: 2018-08-09 | Discharge: 2018-08-09 | Disposition: A | Discharge status: Home / Self Care | Payer: Medicare Other | Source: Ambulatory Visit | Attending: Interventional Radiology | Admitting: Interventional Radiology

## 2018-08-09 ENCOUNTER — Ambulatory Visit (HOSPITAL_COMMUNITY): Payer: Medicare Other

## 2018-08-09 ENCOUNTER — Telehealth: Payer: Self-pay | Admitting: *Deleted

## 2018-08-09 DIAGNOSIS — I639 Cerebral infarction, unspecified: Secondary | ICD-10-CM

## 2018-08-09 DIAGNOSIS — I63433 Cerebral infarction due to embolism of bilateral posterior cerebral arteries: Secondary | ICD-10-CM | POA: Diagnosis not present

## 2018-08-09 DIAGNOSIS — Z9889 Other specified postprocedural states: Secondary | ICD-10-CM | POA: Diagnosis not present

## 2018-08-09 HISTORY — DX: Cerebral infarction, unspecified: I63.9

## 2018-08-09 NOTE — Progress Notes (Signed)
Chief Complaint: Patient was seen in consultation today for stroke follow-up.  Referring Physician(s): CODE STROKE- Amie Portland  Supervising Physician: Luanne Bras  Patient Status: [facility] - Out-pt  History of Present Illness: Jason BENNETT Sr. is a 73 y.o. male with a past medical history as below, with pertinent past medical history including hypertension, hyperlipidemia, CVA 06/2018, COPD, rectal cancer, ESRD on HD, anemia, obesity, and OSA on CPAP. He is known to [facility] and has been followed by Dr. Estanislado Pandy since 06/2018. He first presented to our department as an active code stroke. He presented to [facility] ED via EMS 06/08/2018 with complaints of right-sided gaze, left-sided deficits (flaccidity, decreased sensation, neglect), and dysarthria. He underwent an emergent image-guided cerebral arteriogram with mechanical thrombectomy of his right ICA, right MCA and right ACA occlusions, along with rescue stent placement of his right ICA and right MCA occlusions, achieving a TICI 2b revascularization 06/08/2018 by Dr. Estanislado Pandy. He was discharged to inpatient rehab 12/10/12019 and discharged home 06/28/2018.  Patient presents today for follow-up regarding his recent CVA and procedure 06/08/2018. Patient awake and alert sitting in wheelchair. Accompanied by wife. Complains of left arm weakness. States it has improved since discharge. States that he sees PT/OT for this. Complains of dysarthria. States it has improved since discharge. Wife reports his speech "gets better everyday". Denies headache, numbness/tingling, dizziness, vision changes, hearing changes, or tinnitus.  Patient is currently taking Brilinta 90 mg twice daily and Aspirin 81 mg once daily.   Past Medical History:  Diagnosis Date  . Anemia   . Arthritis   . Chronic renal insufficiency 07/31/2011  . COPD (chronic obstructive pulmonary disease) ([place])    pt denies  . Deficiency anemia 07/20/2012  . Diverticulosis   . Edema  09/21/2010   Qualifier: Diagnosis of  By: Jerold Coombe    . ERECTILE DYSFUNCTION, MILD 05/11/2007   Qualifier: Diagnosis of  By: Sherren Mocha MD, Jory Ee   . Exertional dyspnea 10/30/2015  . GOUT 05/11/2007   Qualifier: Diagnosis of  By: Sherren Mocha MD, Jory Ee   . Hereditary and idiopathic peripheral neuropathy 09/14/2014  . History of radiation therapy 02/21/13-03/31/13   rectum 50.4Gy total dose  . Hypersomnia 10/30/2015  . Hypertension   . Lung nodule 10/30/2015  . OAB (overactive bladder) 01/09/2016  . Obesity (BMI 30.0-34.9) 10/30/2015  . Obstructive sleep apnea 07/29/2010   HST 11/2015 AHI 54.  On autocpap  >> 10 cm   . Peripheral edema 09/19/2013  . rectal ca dx'd 02/01/13   rectal. Radiation and chemotherapy- remains on chemotherapy-next tx. 08-22-13.   . S/P partial lobectomy of lung 11/20/2015  . S/P thoracotomy   . SUI (stress urinary incontinence), male 01/09/2016    Past Surgical History:  Procedure Laterality Date  . A/V FISTULAGRAM Left 06/14/2018   Procedure: A/V FISTULAGRAM;  Surgeon: Waynetta Sandy, MD;  Location: [place] [facility];  Service: Cardiovascular;  Laterality: Left;  . AV FISTULA PLACEMENT Left 03/24/2016   Procedure: CREATION OF LEFT BRACIOCEPHALIC ARTERIOVENOUS (AV) FISTULA;  Surgeon: Elam Dutch, MD;  Location: [place];  Service: Vascular;  Laterality: Left;  . COLON SURGERY  05/20/2013  . COLONOSCOPY  2010   [facility]  . EUS N/A 02/03/2013   Procedure: LOWER ENDOSCOPIC ULTRASOUND (EUS);  Surgeon: Milus Banister, MD;  Location: [facility];  Service: Endoscopy;  Laterality: N/A;  . ILEOSTOMY CLOSURE N/A 08/24/2013   Procedure: CLOSURE OF LOOP ILEOSTOMY ;  Surgeon: Leighton Ruff, MD;  [facility];  Service: General;  Laterality: N/A;  . IR ANGIO VERTEBRAL SEL SUBCLAVIAN INNOMINATE BILAT MOD SED  06/08/2018  . IR CT HEAD LTD  06/08/2018  . IR INTRAVSC STENT CERV CAROTID W/O EMB-PROT MOD SED INC ANGIO  06/08/2018  . IR PERCUTANEOUS ART THROMBECTOMY/INFUSION  INTRACRANIAL INC DIAG ANGIO  06/08/2018  . LAPAROSCOPIC LOW ANTERIOR RESECTION N/A 05/20/2013   Procedure: LAPAROSCOPIC LOW ANTERIOR RESECTION WITH SPLENIC FLEXURE MOBILIZATION;  Surgeon: Leighton Ruff, MD;  [facility];  Service: General;  Laterality: N/A;  . OSTOMY N/A 05/20/2013   Procedure: diverting OSTOMY;  Surgeon: Leighton Ruff, MD;  [facility];  Service: General;  Laterality: N/A;  . PERIPHERAL VASCULAR INTERVENTION Left 06/14/2018   Procedure: PERIPHERAL VASCULAR INTERVENTION;  Surgeon: Waynetta Sandy, MD;  Location: [place] [facility];  Service: Cardiovascular;  Laterality: Left;  AVF on the Left  . RADIOLOGY WITH ANESTHESIA N/A 06/08/2018   Procedure: IR WITH ANESTHESIA;  Surgeon: Radiologist, Medication, MD;  Location: [place];  Service: Radiology;  Laterality: N/A;  . VIDEO ASSISTED THORACOSCOPY (VATS)/WEDGE RESECTION Left 11/20/2015   Procedure: VIDEO ASSISTED THORACOSCOPY (VATS)/LUNG RESECTION;  Surgeon: [name], MD;  Location: [place];  Service: Thoracic;  Laterality: Left;  Marland Kitchen VIDEO BRONCHOSCOPY N/A 11/20/2015   Procedure: VIDEO BRONCHOSCOPY;  Surgeon: [name], MD;  Location: [facility];  Service: Thoracic;  Laterality: N/A;    Allergies: Nsaids; Lactose intolerance (gi); and Amlodipine  Medications: Prior to Admission medications   Medication Sig Start Date End Date Taking? Authorizing Provider  acetaminophen (TYLENOL) 325 MG tablet Take 1-2 tablets (325-650 mg total) by mouth every 4 (four) hours as needed for mild pain. 06/29/18   Love, Ivan Anchors, PA-C  allopurinol (ZYLOPRIM) 100 MG tablet Take 1 tablet (100 mg total) by mouth daily. 06/29/18   Love, Ivan Anchors, PA-C  aspirin 81 MG chewable tablet Chew 1 tablet (81 mg total) by mouth daily. 06/29/18   Love, Ivan Anchors, PA-C  atorvastatin (LIPITOR) 20 MG tablet Take 1 tablet (20 mg total) by mouth daily at 6 PM. 07/20/18   Marrian Salvage, FNP  BRILINTA 90 MG TABS tablet TAKE 1 TABLET BY MOUTH  TWICE DAILY 08/02/18   Kirsteins, Luanna Salk, MD  calcitRIOL (ROCALTROL) 0.25 MCG capsule TAKE 1 CAPSULE BY MOUTH ONCE DAILY 07/26/18   [name], MD  cholecalciferol (VITAMIN D) 1000 units tablet Take 5,000 Units by mouth daily.    [provider]  diphenoxylate-atropine (LOMOTIL) 2.5-0.025 MG tablet Take 1 tablet by mouth 4 (four) times daily as needed for diarrhea or loose stools. 08/06/18   Biagio Borg, MD  gabapentin (NEURONTIN) 300 MG capsule TAKE 1 CAPSULE BY MOUTH EVERY DAY AT BEDTIME. PT NEEDS APPT PRIOR TO FUTURE REFILLS 08/02/18   Jamse Arn, MD  multivitamin (RENA-VIT) TABS tablet Take 1 tablet by mouth at bedtime. 06/29/18   Love, Ivan Anchors, PA-C  pantoprazole (PROTONIX) 40 MG tablet TAKE 1 TABLET BY MOUTH ONCE DAILY 07/26/18   [name], MD  polycarbophil (FIBERCON) 625 MG tablet Take 2 tablets (1,250 mg total) by mouth 2 (two) times daily. 06/29/18   Love, Ivan Anchors, PA-C  saccharomyces boulardii (FLORASTOR) 250 MG capsule Take 1 capsule (250 mg total) by mouth 2 (two) times daily. 06/29/18   Bary Leriche, PA-C     Family History  Problem Relation Age of Onset  . Stroke Mother   . Hypertension Mother   . Stroke  Father   . Hypertension Father   . Hypertension Other   . Colon cancer Neg Hx     Social History   Socioeconomic History  . Marital status: Married    Spouse name: [name]  . Number of children: 2  . Years of education: 69  . Highest education level: Not on file  Occupational History    Comment: retired Insurance account manager  Social Needs  . Financial resource strain: Not hard at all  . Food insecurity:    Worry: Never true    Inability: Never true  . Transportation needs:    Medical: No    Non-medical: No  Tobacco Use  . Smoking status: Former Smoker    Packs/day: 0.50    Years: 10.00    Pack years: 5.00    Types: Cigarettes    Last attempt to quit: 07/07/1977    Years since quitting: 41.1  . Smokeless tobacco: Never Used  Substance  and Sexual Activity  . Alcohol use: Yes    Alcohol/week: 0.0 standard drinks    Comment: drinks occasionally  . Drug use: No  . Sexual activity: Not on file  Lifestyle  . Physical activity:    Days per week: 6 days    Minutes per session: 30 min  . Stress: Not at all  Relationships  . Social connections:    Talks on phone: More than three times a week    Gets together: More than three times a week    Attends religious service: More than 4 times per year    Active member of club or organization: Yes    Attends meetings of clubs or organizations: More than 4 times per year    Relationship status: Married  Other Topics Concern  . Not on file  Social History Narrative   Married to wife, [name]   Retired Insurance account manager w/[employer]   #2 grown children and #6 grandchildren     Review of Systems: A 12 point ROS discussed and pertinent positives are indicated in the HPI above.  All other systems are negative.  Review of Systems  Constitutional: Negative for chills and fever.  HENT: Negative for hearing loss and tinnitus.   Eyes: Negative for visual disturbance.  Respiratory: Negative for shortness of breath and wheezing.   Cardiovascular: Negative for chest pain and palpitations.  Neurological: Positive for speech difficulty and weakness. Negative for dizziness, numbness and headaches.  Psychiatric/Behavioral: Negative for behavioral problems and confusion.    Physical Exam Constitutional:      General: He is not in acute distress.    Appearance: Normal appearance.  Pulmonary:     Effort: Pulmonary effort is normal. No respiratory distress.  Skin:    General: Skin is warm and dry.  Neurological:     Mental Status: He is alert and oriented to person, place, and time.     Comments: No pronator drift. Fine motor and coordination intact and symmetric.  Psychiatric:        Mood and Affect: Mood normal.        Behavior: Behavior normal.         Thought Content: Thought content normal.        Judgment: Judgment normal.      Imaging: Dg Foot 2 Views Left  Result Date: 07/30/2018 Please see detailed radiograph report in office note.   Labs:  CBC: Recent Labs    06/28/18 1339 06/29/18 0927 07/28/18 1701 08/06/18 1103  WBC 2.5* 9.2 10.8* 8.3  HGB 9.5* 10.3* 9.1* 9.2*  HCT 30.2* 33.7* 31.0* 28.4*  PLT 167 134* 377 303.0    COAGS: Recent Labs    06/08/18 1514  INR 1.06  APTT 29    BMP: Recent Labs    06/24/18 1323 06/26/18 1232 06/28/18 1339 07/28/18 1701  NA 132* 134* 139 136  K 3.4* 3.3* 3.7 3.3*  CL 95* 97* 103 96*  CO2 23 25 26 29   GLUCOSE 105* 114* 101* 94  BUN 28* 25* 27* 20  CALCIUM 9.6 9.5 9.6 9.9  CREATININE 4.93* 4.89* 4.27* 5.49*  GFRNONAA 11* 11* 13* 10*  GFRAA 13* 13* 15* 11*    LIVER FUNCTION TESTS: Recent Labs    05/08/18 1022 06/08/18 1514  06/15/18 2156  06/24/18 1323 06/26/18 1232 06/28/18 1339 07/28/18 1701  BILITOT 0.8 0.9  --   --   --   --   --   --  0.6  AST 39 18  --   --   --   --   --   --  23  ALT 30 15  --  12  --   --   --   --  18  ALKPHOS 108 87  --   --   --   --   --   --  149*  PROT 5.9* 6.7  --   --   --   --   --   --  7.7  ALBUMIN 3.5 4.0   < >  --    < > 2.9* 2.6* 2.7* 3.3*   < > = values in this interval not displayed.    Assessment and Plan:  Right ICA occlusion s/p emergent mechanical thrombectomy and rescue stent placement achieving a TICI 2b revascularization 06/08/2018 by Dr. Estanislado Pandy. Right MCA occlusion s/p emergent mechanical thrombectomy and rescue stent placement achieving a TICI 2b revascularization 06/08/2018 by Dr. Estanislado Pandy. Right ACA occlusion s/p emergent mechanical thrombectomy achieving a TICI 2b revascularization 06/08/2018 by Dr. Estanislado Pandy. Dr. Estanislado Pandy was present for consultation. Reviewed imaging with patient and wife. Brought to their attention was patient's right ICA, right MCA, and right ACA occlusions, both prior to and  following endovascular revascularization. Explained that the best course of management at this time is with routine imaging scans to monitor for changes/assure that stents are patent.  Instructed patient to follow-up with his neurologist and nephrologist regularly.  Instructed patient that if he notices new onset of weakness, facial droop, numbness/tingling, vision changes, etc., to call 911 immediately.  Plan for follow-up MRA head (without contrast) at the end of 09/2018. Informed patient that our schedulers will call him to set up this imaging scan. Instructed patient to continue taking Brilinta 90 mg twice daily and Aspirin 81 mg once daily.  All questions answered and concerns addressed. Patient and wife convey understanding and agree with plan.  Thank you for this interesting consult.  I greatly enjoyed meeting Jason COVIN Sr. and look forward to participating in their care.  A copy of this report was sent to the requesting provider on this date.  Electronically Signed: Earley Abide, PA-C 08/09/2018, 9:34 AM   I spent a total of 40 Minutes in face to face in clinical consultation, greater than 50% of which was counseling/coordinating care for right ICA, right MCA, and right ACA occlusions s/p revascularization.

## 2018-08-09 NOTE — Telephone Encounter (Signed)
Not able to make the OV on 08/12/18 due to being in dialysis. Sent message to MD requesting appointment date/time and informed his wife she will be called in the am with new appointment.

## 2018-08-10 ENCOUNTER — Encounter

## 2018-08-10 ENCOUNTER — Telehealth: Payer: Self-pay | Admitting: *Deleted

## 2018-08-10 ENCOUNTER — Ambulatory Visit: Payer: Medicare Other | Admitting: Nurse Practitioner

## 2018-08-10 ENCOUNTER — Other Ambulatory Visit (INDEPENDENT_AMBULATORY_CARE_PROVIDER_SITE_OTHER): Payer: Medicare Other

## 2018-08-10 ENCOUNTER — Encounter: Payer: Self-pay | Admitting: Nurse Practitioner

## 2018-08-10 VITALS — BP 124/78 | HR 93 | Ht 68.0 in | Wt 160.6 lb

## 2018-08-10 DIAGNOSIS — N2581 Secondary hyperparathyroidism of renal origin: Secondary | ICD-10-CM | POA: Diagnosis not present

## 2018-08-10 DIAGNOSIS — D649 Anemia, unspecified: Secondary | ICD-10-CM

## 2018-08-10 DIAGNOSIS — R52 Pain, unspecified: Secondary | ICD-10-CM | POA: Diagnosis not present

## 2018-08-10 DIAGNOSIS — Z23 Encounter for immunization: Secondary | ICD-10-CM | POA: Diagnosis not present

## 2018-08-10 DIAGNOSIS — L299 Pruritus, unspecified: Secondary | ICD-10-CM | POA: Diagnosis not present

## 2018-08-10 DIAGNOSIS — R197 Diarrhea, unspecified: Secondary | ICD-10-CM | POA: Diagnosis not present

## 2018-08-10 DIAGNOSIS — D509 Iron deficiency anemia, unspecified: Secondary | ICD-10-CM | POA: Diagnosis not present

## 2018-08-10 DIAGNOSIS — D689 Coagulation defect, unspecified: Secondary | ICD-10-CM | POA: Diagnosis not present

## 2018-08-10 DIAGNOSIS — D631 Anemia in chronic kidney disease: Secondary | ICD-10-CM | POA: Diagnosis not present

## 2018-08-10 DIAGNOSIS — N186 End stage renal disease: Secondary | ICD-10-CM | POA: Diagnosis not present

## 2018-08-10 LAB — STOOL CULTURE
MICRO NUMBER: 134369
MICRO NUMBER:: 134370
MICRO NUMBER:: 134371
SHIGA RESULT:: NOT DETECTED
SPECIMEN QUALITY: ADEQUATE
SPECIMEN QUALITY:: ADEQUATE
SPECIMEN QUALITY:: ADEQUATE

## 2018-08-10 LAB — CBC
HCT: 28.3 % — ABNORMAL LOW (ref 39.0–52.0)
Hemoglobin: 9.2 g/dL — ABNORMAL LOW (ref 13.0–17.0)
MCHC: 32.6 g/dL (ref 30.0–36.0)
MCV: 90.2 fl (ref 78.0–100.0)
Platelets: 267 10*3/uL (ref 150.0–400.0)
RBC: 3.14 Mil/uL — ABNORMAL LOW (ref 4.22–5.81)
RDW: 18.3 % — ABNORMAL HIGH (ref 11.5–15.5)
WBC: 6.8 10*3/uL (ref 4.0–10.5)

## 2018-08-10 NOTE — Telephone Encounter (Signed)
Left VM with new appointment for 08/13/18 at 1145 with NP and Dr. Benay Spice. Requested return call if he is not able to come at this date/time.

## 2018-08-10 NOTE — Patient Instructions (Signed)
If you are age 72 or older, your body mass index should be between 23-30. Your Body mass index is 24.42 kg/m. If this is out of the aforementioned range listed, please consider follow up with your Primary Care Provider.  If you are age 58 or younger, your body mass index should be between 19-25. Your Body mass index is 24.42 kg/m. If this is out of the aformentioned range listed, please consider follow up with your Primary Care Provider.   Your provider has requested that you go to the basement level for lab work before leaving today. Press "B" on the elevator. The lab is located at the first door on the left as you exit the elevator. CBC  HOLD Protonix for a few days to see if diarrhea gets better.  We will call you with results.  Thank you for choosing me and [facility].   Tye Savoy, NP

## 2018-08-10 NOTE — Telephone Encounter (Signed)
Wife returned call and confirmed appointment for 2/7 at 11:45.

## 2018-08-10 NOTE — Progress Notes (Signed)
Chief Complaint:   anemia   IMPRESSION and PLAN:    72.  73 year old male with recent development of anemia. Baseline hemoglobin 12.1 , acute drop in hgb to 6.7 early December during admission for CVA / cerebral artery embolism for which patient underwent mechanical thrombectomy with stenting of the right MCA. Started on Brillinta post procedure.   -Transfused a unit of blood and started Aranesp 12/17 . Hgb stable around 9 since.  -Seen in ED 07/28/18 for diarrhea with blood. Hgb stable ~ 9, light brown stool on exam in ED.  -Patient has a hx of stage III rectal cancer, no evidence for recurrent rectal cancer on last colonoscopy Nov 2017, not due for surveillance colonoscopy until Nov 2022. Not clear why the episode of rectal bleeding prompting ED visit on 07/28/17.  No further rectal bleeding but reports passage of one black stool a few days ago, none since.  -continue PPI -CBC today.   -Patient is at increased risk for procedures given co-morbidities including cardiomyopathy and recent CVA / need for coagulation . At this point would monitor hgb and consider endoscopic evaluation only if there is worsening anemia and / or more overt GI bleeding.    2. Chronic diarrhea, we have seen him for this before but patient thinks the diarrhea has gotten worse since discharge from hospitalization in February at which time he was started on Protonix.  GI pathogen panel negative except for lactoferrin on 07/21/2018 -I believe the diarrhea is more chronic than what patient realizes.  Since he does correlate it with the Protonix however I have asked him to hold the PPI for a few days to see if there is any improvement in diarrhea.  If no improvement he needs to resume Protonix ASAP  3. Dilated cardiomyopathy, LVEF 20-25%  4. ESRD, started dialysis early Dec 2019.   HPI:     Patient is a 73 year old male with multiple medical problems not limited to cardiomyopathy with EF of 20- 25%,  recent  initiation of hemodialysis , OSA , history of stage III rectal cancer s/p chemoxrt and resection 2014. PET scan in 2017 revealed hypermetabolic nodule in left lung, s/p partial lobectomy which confirmed a 1 cm focus of metastatic adenocarcinoma of colorectal origin. Patient remains in clinical remission from rectal cancer but for restaging CTscan this month.   Mr. Campas is here here at the request of Dr. Billy Fischer , ED provider.  Patient seen in ED 07/28/18 evaluation of diarrhea containing blood setting of anticoagulation.  On exam in ED patient was hemodynamically stable . Stool heme positive but light brown in color.  His hemoglobin was completely stable around 9.  Further work-up in the ED was not felt necessary.  He was advised to follow-up with [place].  Mr. Haskell Flirt has no abdominal pain.  He has no nausea or vomiting.  He describes passage of one black stools a few days ago, none since.  He has chronic intermittent diarrhea. He does have a hx of C-diff in 2015 but C-diff negative in April 2018 and again on Jan 2020 (part of Gi path panel). Lactoferrin was positive on January stool studies  Patient is up-to-date on surveillance colonoscopy, last one November 2017.  He is due for follow-up exam around November 2022.  Review of systems:     No chest pain, no SOB, no fevers, no urinary sx   Past Medical History:  Diagnosis Date  . Anemia   .  Arthritis   . Chronic renal insufficiency 07/31/2011  . COPD (chronic obstructive pulmonary disease) ([place])    pt denies  . CVA (cerebral vascular accident) ([place]) 06/2018  . Deficiency anemia 07/20/2012  . Diverticulosis   . Edema 09/21/2010   Qualifier: Diagnosis of  By: Jerold Coombe    . ERECTILE DYSFUNCTION, MILD 05/11/2007   Qualifier: Diagnosis of  By: Sherren Mocha MD, Jory Ee   . Exertional dyspnea 10/30/2015  . GOUT 05/11/2007   Qualifier: Diagnosis of  By: Sherren Mocha MD, Jory Ee   . Hereditary and idiopathic peripheral neuropathy 09/14/2014  . History of  radiation therapy 02/21/13-03/31/13   rectum 50.4Gy total dose  . Hypersomnia 10/30/2015  . Hypertension   . Lung nodule 10/30/2015  . OAB (overactive bladder) 01/09/2016  . Obesity (BMI 30.0-34.9) 10/30/2015  . Obstructive sleep apnea 07/29/2010   HST 11/2015 AHI 54.  On autocpap  >> 10 cm   . Peripheral edema 09/19/2013  . rectal ca dx'd 02/01/13   rectal. Radiation and chemotherapy- remains on chemotherapy-next tx. 08-22-13.   . S/P partial lobectomy of lung 11/20/2015  . S/P thoracotomy   . SUI (stress urinary incontinence), male 01/09/2016    Patient's surgical history, family medical history, social history, medications and allergies were all reviewed in Epic   Serum creatinine: 5.49 mg/dL (H) 07/28/18 1701 Estimated creatinine clearance: 11.9 mL/min (A)  Current Outpatient Medications  Medication Sig Dispense Refill  . acetaminophen (TYLENOL) 325 MG tablet Take 1-2 tablets (325-650 mg total) by mouth every 4 (four) hours as needed for mild pain.    Marland Kitchen allopurinol (ZYLOPRIM) 100 MG tablet Take 1 tablet (100 mg total) by mouth daily. 30 tablet 0  . aspirin 81 MG chewable tablet Chew 1 tablet (81 mg total) by mouth daily. 100 tablet 0  . atorvastatin (LIPITOR) 20 MG tablet Take 1 tablet (20 mg total) by mouth daily at 6 PM. 30 tablet 1  . BRILINTA 90 MG TABS tablet TAKE 1 TABLET BY MOUTH TWICE DAILY 60 tablet 0  . calcitRIOL (ROCALTROL) 0.25 MCG capsule TAKE 1 CAPSULE BY MOUTH ONCE DAILY 30 capsule 0  . cholecalciferol (VITAMIN D) 1000 units tablet Take 5,000 Units by mouth daily.    . diphenoxylate-atropine (LOMOTIL) 2.5-0.025 MG tablet Take 1 tablet by mouth 4 (four) times daily as needed for diarrhea or loose stools. 40 tablet 0  . gabapentin (NEURONTIN) 300 MG capsule TAKE 1 CAPSULE BY MOUTH EVERY DAY AT BEDTIME. PT NEEDS APPT PRIOR TO FUTURE REFILLS 30 capsule 0  . multivitamin (RENA-VIT) TABS tablet TAKE 1 TABLET BY MOUTH AT BEDTIME 90 each 1  . pantoprazole (PROTONIX) 40 MG tablet TAKE 1  TABLET BY MOUTH ONCE DAILY 90 tablet 1  . polycarbophil (FIBERCON) 625 MG tablet Take 2 tablets (1,250 mg total) by mouth 2 (two) times daily.    Marland Kitchen saccharomyces boulardii (FLORASTOR) 250 MG capsule Take 1 capsule (250 mg total) by mouth 2 (two) times daily. 100 capsule 0   No current facility-administered medications for this visit.     Physical Exam:     BP 124/78   Pulse 93   Ht 5\' 8"  (1.727 m)   Wt 160 lb 9.6 oz (72.8 kg)   SpO2 99%   BMI 24.42 kg/m   GENERAL:  Pleasant male in NAD PSYCH: : Cooperative, normal affect EENT:  conjunctiva pink, mucous membranes moist, neck supple without masses CARDIAC:  RRR,  no peripheral edema PULM: Normal respiratory effort, lungs CTA  bilaterally, no wheezing ABDOMEN:  Nondistended, soft, nontender. No obvious masses,  normal bowel sounds SKIN:  turgor, no lesions seen Musculoskeletal:  Normal muscle tone, normal strength NEURO: Alert and oriented x 3, no focal neurologic deficits   Tye Savoy , NP 08/10/2018, 9:27 AM

## 2018-08-11 ENCOUNTER — Ambulatory Visit (HOSPITAL_COMMUNITY)
Admission: RE | Admit: 2018-08-11 | Discharge: 2018-08-11 | Disposition: A | Discharge status: Home / Self Care | Payer: Medicare Other | Source: Ambulatory Visit | Attending: Nurse Practitioner | Admitting: Nurse Practitioner

## 2018-08-11 ENCOUNTER — Inpatient Hospital Stay: Payer: Medicare Other | Attending: Oncology

## 2018-08-11 ENCOUNTER — Encounter (HOSPITAL_COMMUNITY): Payer: Self-pay

## 2018-08-11 DIAGNOSIS — N179 Acute kidney failure, unspecified: Secondary | ICD-10-CM | POA: Insufficient documentation

## 2018-08-11 DIAGNOSIS — Z85048 Personal history of other malignant neoplasm of rectum, rectosigmoid junction, and anus: Secondary | ICD-10-CM | POA: Diagnosis not present

## 2018-08-11 DIAGNOSIS — M109 Gout, unspecified: Secondary | ICD-10-CM | POA: Diagnosis not present

## 2018-08-11 DIAGNOSIS — D638 Anemia in other chronic diseases classified elsewhere: Secondary | ICD-10-CM | POA: Diagnosis not present

## 2018-08-11 DIAGNOSIS — J984 Other disorders of lung: Secondary | ICD-10-CM | POA: Diagnosis not present

## 2018-08-11 DIAGNOSIS — I10 Essential (primary) hypertension: Secondary | ICD-10-CM | POA: Diagnosis not present

## 2018-08-11 DIAGNOSIS — C20 Malignant neoplasm of rectum: Secondary | ICD-10-CM | POA: Insufficient documentation

## 2018-08-11 DIAGNOSIS — C210 Malignant neoplasm of anus, unspecified: Secondary | ICD-10-CM | POA: Diagnosis not present

## 2018-08-11 DIAGNOSIS — Z8673 Personal history of transient ischemic attack (TIA), and cerebral infarction without residual deficits: Secondary | ICD-10-CM | POA: Insufficient documentation

## 2018-08-11 LAB — CEA (IN HOUSE-CHCC): CEA (CHCC-In House): 1.63 ng/mL (ref 0.00–5.00)

## 2018-08-11 MED ORDER — IOHEXOL 300 MG/ML  SOLN
30.0000 mL | Freq: Once | INTRAMUSCULAR | Status: AC | PRN
  Administered 2018-08-11: 30 mL via ORAL

## 2018-08-12 ENCOUNTER — Ambulatory Visit: Payer: Medicare Other | Admitting: Oncology

## 2018-08-12 DIAGNOSIS — D689 Coagulation defect, unspecified: Secondary | ICD-10-CM | POA: Diagnosis not present

## 2018-08-12 DIAGNOSIS — L299 Pruritus, unspecified: Secondary | ICD-10-CM | POA: Diagnosis not present

## 2018-08-12 DIAGNOSIS — D631 Anemia in chronic kidney disease: Secondary | ICD-10-CM | POA: Diagnosis not present

## 2018-08-12 DIAGNOSIS — N186 End stage renal disease: Secondary | ICD-10-CM | POA: Diagnosis not present

## 2018-08-12 DIAGNOSIS — N2581 Secondary hyperparathyroidism of renal origin: Secondary | ICD-10-CM | POA: Diagnosis not present

## 2018-08-12 DIAGNOSIS — R52 Pain, unspecified: Secondary | ICD-10-CM | POA: Diagnosis not present

## 2018-08-12 DIAGNOSIS — R197 Diarrhea, unspecified: Secondary | ICD-10-CM | POA: Diagnosis not present

## 2018-08-12 DIAGNOSIS — D509 Iron deficiency anemia, unspecified: Secondary | ICD-10-CM | POA: Diagnosis not present

## 2018-08-12 DIAGNOSIS — Z23 Encounter for immunization: Secondary | ICD-10-CM | POA: Diagnosis not present

## 2018-08-13 ENCOUNTER — Encounter: Payer: Self-pay | Admitting: Nurse Practitioner

## 2018-08-13 ENCOUNTER — Inpatient Hospital Stay: Payer: Medicare Other | Admitting: Nurse Practitioner

## 2018-08-13 ENCOUNTER — Telehealth: Payer: Self-pay | Admitting: Oncology

## 2018-08-13 VITALS — BP 133/85 | HR 94 | Temp 97.6°F | Resp 18 | Ht 68.0 in | Wt 160.4 lb

## 2018-08-13 DIAGNOSIS — D638 Anemia in other chronic diseases classified elsewhere: Secondary | ICD-10-CM | POA: Diagnosis not present

## 2018-08-13 DIAGNOSIS — R630 Anorexia: Secondary | ICD-10-CM

## 2018-08-13 DIAGNOSIS — N19 Unspecified kidney failure: Secondary | ICD-10-CM

## 2018-08-13 DIAGNOSIS — N179 Acute kidney failure, unspecified: Secondary | ICD-10-CM | POA: Diagnosis not present

## 2018-08-13 DIAGNOSIS — Z8673 Personal history of transient ischemic attack (TIA), and cerebral infarction without residual deficits: Secondary | ICD-10-CM | POA: Diagnosis not present

## 2018-08-13 DIAGNOSIS — R197 Diarrhea, unspecified: Secondary | ICD-10-CM

## 2018-08-13 DIAGNOSIS — C20 Malignant neoplasm of rectum: Secondary | ICD-10-CM

## 2018-08-13 DIAGNOSIS — Z85048 Personal history of other malignant neoplasm of rectum, rectosigmoid junction, and anus: Secondary | ICD-10-CM | POA: Diagnosis not present

## 2018-08-13 DIAGNOSIS — I10 Essential (primary) hypertension: Secondary | ICD-10-CM | POA: Diagnosis not present

## 2018-08-13 DIAGNOSIS — M109 Gout, unspecified: Secondary | ICD-10-CM | POA: Diagnosis not present

## 2018-08-13 DIAGNOSIS — R634 Abnormal weight loss: Secondary | ICD-10-CM

## 2018-08-13 DIAGNOSIS — Z992 Dependence on renal dialysis: Secondary | ICD-10-CM

## 2018-08-13 NOTE — Progress Notes (Addendum)
[facility] OFFICE PROGRESS NOTE   Diagnosis: Rectal cancer  INTERVAL HISTORY:   Jason [name] returns as scheduled.  Jason [name] had a stroke in December.  Jason [name] reports the left-sided weakness is better.  Jason [name] reports Jason [name] has been having diarrhea since the December hospitalization.  Jason [name] saw gastroenterology recently and Protonix was discontinued.  She notes mild improvement in the diarrhea.  Jason [name] has noted blood on 1 occasion.  Appetite is poor.  Jason [name] has lost a significant amount of weight since beginning dialysis.  Objective:  Vital signs in last 24 hours:  Blood pressure 133/85, pulse 94, temperature 97.6 F (36.4 C), temperature source Oral, resp. rate 18, height 5\' 8"  (1.727 m), weight 160 lb 6.4 oz (72.8 kg), SpO2 99 %.    HEENT: Neck without mass. Lymphatics: No palpable cervical, supra clavicular, axillary or inguinal lymph nodes. Resp: Lungs clear bilaterally. Cardio: Regular rate and rhythm. GI: Abdomen soft and nontender.  No hepatomegaly. Vascular: No leg edema. Neuro: Alert and oriented.  Lab Results:  Lab Results  Component Value Date   WBC 6.8 08/10/2018   HGB 9.2 (L) 08/10/2018   HCT 28.3 (L) 08/10/2018   MCV 90.2 08/10/2018   PLT 267.0 08/10/2018   NEUTROABS 6.0 08/06/2018    Imaging:  No results found.  Medications: I have reviewed the patient's current medications.  Assessment/Plan: 1. Rectal cancer, clinical stage III (uT3, uN2).  Initiation of concurrent Xeloda and radiation on 02/21/2013, completed 03/31/2013.   CEA normal 04/25/2013   Status post laparoscopic low anterior resection with diverting loop ileostomy 05/23/2013. Final pathology showed a 3.2 cm invasive adenocarcinoma with neoadjuvant related change. Tumor invaded into perirectal soft tissue. There was no lymphovascular or perineural invasion. 14 lymph nodes were negative for tumor. Surgical margins were negative. There was one hyperplastic polyp (ypT3, pN0).   Initiation of  weekly 5-FU/leucovorin 08/01/2013   Ileostomy takedown 08/24/2013.   Weekly 5-FU/leucovorin resumed 09/22/2013.  Restaging CTs 02/20/2014 negative for evidence of recurrent disease  restaging CT scans 04/05/2015 negative for evidence of recurrent disease  Surveillance colonoscopy 03/31/2014, status post removal of of a tubular adenoma from the ascending colon  Enlarging left posterior medial upper lobe nodule on chest CT 04/05/2015 and 07/12/2015  PET scan 10/22/2015 with a hypermetabolic nodule in the medialaspect of the leftupper lobe, other lung nodules stable  Left lung wedge resection 11/20/2015 confirmed a 1 cm focus of metastatic adenocarcinoma of colorectal origin, resection margins and multiple lymph nodes negative  CT scans 03/17/2016-bandlike scarring along the left upper lobe nodule resection with a soft tissue density component; calcified bilateral pleural plaques; no findings of recurrence of the abdomen or pelvis.  CTs 09/30/2016-negative for progressive disease  CTs 06/05/2017-new pulmonary nodule right upper lobe. Stable pleural nodularity within the left and right lung. Stable nodular thickening along the left upper lobe resection margin. No evidence of metastasis in the abdomen/pelvis. Interval enlargement of a solid lesion in the medial aspect of the right kidney most consistent with a benign lesion based on a previous PET scan, the "new" right upper lobe nodule was present on a PET scan 10/22/2015 and similar in size-favored to be benign  CTs 12/01/2017-no evidence of progressive rectal cancer, stable clustered right upper lobe pulmonary nodules, no new nodules  CTs 08/11/2018- no evidence of metastatic disease in the chest, abdomen or pelvis.  Circumferential bladder wall thickening with subtle perivesicular edema, new in the interval.  Index nodule right upper lobe and soft tissue  fullness associated with the inferior aspect of the left upper lobe staple line are  stable.  Multiple bilateral renal lesions of varying size and attenuation, some compatible simple cyst while others are likely cyst complicated by proteinaceous debris or hemorrhage.  Some of the intermediate attenuation lesions although stable in size cannot be fully characterized.  Avascular necrosis left femoral head.  2. Markedly elevated pretreatment CEA 3. Indeterminate pulmonary/subpleural nodules and right hepatic dome lesion. Unchanged on the CT 04/25/2013, unchanged nodular lung lesions on the CT 02/20/2014, see follow-up CTs/PET above 4. Renal insufficiency. Acute on chronic renal failure during hospitalization November/December 2014. Improved following the ileostomy takedown.Jason [name] is followed by nephrology. 5. History of Anemia secondary chemotherapy, chronic disease, and renal insufficiency 6. Hypertension. 7. Gout. 8. Prolonged postoperative ileus following laparoscopic low anterior resection with diverting loop ileostomy 05/23/2013. 9. C. difficile-positive 09/08/2013-status post Flagyl with improvement. Recurrent diarrhea following completion of Flagyl. Stool positive for C. difficile on 09/24/2013. Jason [name] completed treatment with vancomycin. The diarrhea resolved. 10. Left greater than right low leg edema. Venous Doppler 09/26/2013 negative for left leg DVT. 11. Rectal urgency/incontinence-Improved with participation in the pelvic physical therapy clinic. 12. CVA 06/08/2018 presenting with left-sided weakness and severely dysarthric speech.  Stroke felt to be due to large vessel atherosclerosis.  Disposition: Jason [name] appears stable.  Jason [name] is now on dialysis and had a recent stroke.  Jason [name] close to 3 years out from the left lung wedge resection.  Recent restaging CT scans show no evidence of metastatic disease.  No further scans are planned.  We will continue to monitor the CEA.  Jason [name] will continue follow-up with gastroenterology for the diarrhea.  Jason [name] will return for a CEA and follow-up  visit in 6 months.  Jason [name] will contact the office in the interim with any problems.  Patient seen with Dr. Benay Spice.    Jason [name] ANP/GNP-BC   08/13/2018  12:05 PM This was a shared visit with Jason [name].  Jason [name] is in remission from rectal cancer now almost 3 years out from resection of a lung metastasis.  Jason [name] has multiple comorbid conditions including dialysis dependent renal failure and a CVA.  We decided against further surveillance imaging.  Jason Manson, MD

## 2018-08-13 NOTE — Telephone Encounter (Signed)
Scheduled appt per 2/7 los - sent reminder letter in the mail with appt date and time

## 2018-08-14 DIAGNOSIS — N2581 Secondary hyperparathyroidism of renal origin: Secondary | ICD-10-CM | POA: Diagnosis not present

## 2018-08-14 DIAGNOSIS — D689 Coagulation defect, unspecified: Secondary | ICD-10-CM | POA: Diagnosis not present

## 2018-08-14 DIAGNOSIS — D631 Anemia in chronic kidney disease: Secondary | ICD-10-CM | POA: Diagnosis not present

## 2018-08-14 DIAGNOSIS — L299 Pruritus, unspecified: Secondary | ICD-10-CM | POA: Diagnosis not present

## 2018-08-14 DIAGNOSIS — N186 End stage renal disease: Secondary | ICD-10-CM | POA: Diagnosis not present

## 2018-08-14 DIAGNOSIS — D509 Iron deficiency anemia, unspecified: Secondary | ICD-10-CM | POA: Diagnosis not present

## 2018-08-14 DIAGNOSIS — R52 Pain, unspecified: Secondary | ICD-10-CM | POA: Diagnosis not present

## 2018-08-14 DIAGNOSIS — Z23 Encounter for immunization: Secondary | ICD-10-CM | POA: Diagnosis not present

## 2018-08-14 DIAGNOSIS — R197 Diarrhea, unspecified: Secondary | ICD-10-CM | POA: Diagnosis not present

## 2018-08-17 ENCOUNTER — Ambulatory Visit: Payer: Medicare Other | Admitting: Adult Health

## 2018-08-17 ENCOUNTER — Encounter: Payer: Self-pay | Admitting: Adult Health

## 2018-08-17 ENCOUNTER — Telehealth: Payer: Self-pay | Admitting: Nurse Practitioner

## 2018-08-17 VITALS — BP 121/75 | HR 110 | Ht 68.0 in | Wt 156.0 lb

## 2018-08-17 DIAGNOSIS — R197 Diarrhea, unspecified: Secondary | ICD-10-CM | POA: Diagnosis not present

## 2018-08-17 DIAGNOSIS — L299 Pruritus, unspecified: Secondary | ICD-10-CM | POA: Diagnosis not present

## 2018-08-17 DIAGNOSIS — I63511 Cerebral infarction due to unspecified occlusion or stenosis of right middle cerebral artery: Secondary | ICD-10-CM | POA: Diagnosis not present

## 2018-08-17 DIAGNOSIS — R52 Pain, unspecified: Secondary | ICD-10-CM | POA: Diagnosis not present

## 2018-08-17 DIAGNOSIS — D631 Anemia in chronic kidney disease: Secondary | ICD-10-CM | POA: Diagnosis not present

## 2018-08-17 DIAGNOSIS — D689 Coagulation defect, unspecified: Secondary | ICD-10-CM | POA: Diagnosis not present

## 2018-08-17 DIAGNOSIS — N2581 Secondary hyperparathyroidism of renal origin: Secondary | ICD-10-CM | POA: Diagnosis not present

## 2018-08-17 DIAGNOSIS — Z23 Encounter for immunization: Secondary | ICD-10-CM | POA: Diagnosis not present

## 2018-08-17 DIAGNOSIS — G4733 Obstructive sleep apnea (adult) (pediatric): Secondary | ICD-10-CM | POA: Diagnosis not present

## 2018-08-17 DIAGNOSIS — I10 Essential (primary) hypertension: Secondary | ICD-10-CM

## 2018-08-17 DIAGNOSIS — E785 Hyperlipidemia, unspecified: Secondary | ICD-10-CM | POA: Diagnosis not present

## 2018-08-17 DIAGNOSIS — I69322 Dysarthria following cerebral infarction: Secondary | ICD-10-CM

## 2018-08-17 DIAGNOSIS — N186 End stage renal disease: Secondary | ICD-10-CM | POA: Diagnosis not present

## 2018-08-17 DIAGNOSIS — I69354 Hemiplegia and hemiparesis following cerebral infarction affecting left non-dominant side: Secondary | ICD-10-CM

## 2018-08-17 DIAGNOSIS — D509 Iron deficiency anemia, unspecified: Secondary | ICD-10-CM | POA: Diagnosis not present

## 2018-08-17 MED ORDER — FAMOTIDINE 20 MG PO TABS: 20.0000 mg | ORAL_TABLET | Freq: Every day | ORAL | 6 refills | Status: DC

## 2018-08-17 NOTE — Progress Notes (Signed)
[facility] [address] [place]. [place] [address] [phone]       OFFICE FOLLOW UP NOTE  [name] Date of Birth:  04-16-1946 Medical Record Number:  [id number]   Reason for Referral:  hospital stroke follow up  CHIEF COMPLAINT:  Chief Complaint  Patient presents with  . Follow-up    RM 9, with wife. Doing well post stroke. No concerns.    HPI: Jason KRETSCHMER Sr. is being seen today for initial visit in the office for right MCA infarct due to right ICA and MCA occlusion status post Dayarmys descents with TICI 2B reperfusion most likely due to large vessel arthrosclerosis but could not rule out cardioembolic source on 24/23/5361. History obtained from patient, wife and chart review. Reviewed all radiology images and labs personally.  [name] is a 73 y.o. male with history of CKD, COPD, anemia, HTN, obesity, OSA on CPAP, rectal cancer  who was admitted for slurred speech, left sided weakness and found down at home. No tPA given due to outside window.  CT head reviewed and showed MCA hyperdense sign.  CTA head and neck showed right ICA and MCA occlusion, right VA high-grade stenosis versus occlusion, left siphon, right P1/P2 and left VA high-grade stenosis. Taken to IR for R ICA and MCA occlusion s/p rescue stents and TICI2b reperfusion.  MRI head reviewed and showed right MCA patchy infarcts.  MRA showed patent right ICA, right ACA and proximal right MCA with decreased right MCA branches, left ICA siphon right P2 severe stenosis with occluded right VA.  2D echo showed an EF of 20 to 25% and findings consistent with dilated cardiomyopathy.  Recent infarct due to right ICA and MCA occlusion most likely due to large vessel arthrosclerosis but cardioembolic cannot be ruled out.  Lower extremity venous Dopplers negative for DVT.  Recommended 30-day cardiac event monitor outpatient to rule out atrial fibrillation.  LDL 103 and A1c 5.5.  Initiated aspirin  81 mg daily and Brilinta for intracranial stents.  HTN stable and recommended long-term BP goal normotensive range.  Initiated atorvastatin 20 mg for elevated LDL for HDL management.  Other stroke risk factors include advanced age, former tobacco use, former EtOH use, OSA on CPAP and dilated cardiomyopathy with decreased EF.  Other active problems include COPD, rectal cancer history, leukocytosis, hyperkalemia, and ESRD with AVF placed with HD started during admission.  Therapies recommended discharge to CIR for continued therapies.  [name] is being seen today for hospital follow-up and is accompanied by his wife.  He continues to have residual post stroke deficits of dysarthria but denies any residual weakness. He has completed home therapies at this time.  He is able to ambulate without assistive device but will occasionally have balance/gait difficulties.  Denies any cognitive difficulties.  He continues on aspirin 81 mg and Brilinta with one episode in which he was evaluated in ED on 07/28/2018 for rectal bleeding but this was felt likely due to anal fissures/hemorrhoids in setting of ongoing diarrhea.  He was evaluated by outpatient GI since this time.  He denies any additional rectal bleeding since that time and otherwise tolerating continuation of aspirin and Brilinta.  Continues on atorvastatin without side effects myalgias.  Lipid panel has not been obtained since hospital admission and initiation of statin.  Blood pressure today satisfactory at 121/75.  He does monitor at home and these are his typical levels.  He was evaluated by interventional radiology  and recommended repeat imaging towards the end of 09/2018. He continues HD T/TH/Sat.  Wife does endorse decreased energy and sleeping a lot since initiating HD which was also making participation in home PT/OT difficult.  Denies new or worsening stroke/TIA symptoms.   ROS:   14 system review of systems performed and negative with exception of  speech difficulty and walking difficulty  PMH:  Past Medical History:  Diagnosis Date  . Anemia   . Arthritis   . Chronic renal insufficiency 07/31/2011  . COPD (chronic obstructive pulmonary disease) ([place])    pt denies  . CVA (cerebral vascular accident) ([place]) 06/2018  . Deficiency anemia 07/20/2012  . Diverticulosis   . Edema 09/21/2010   Qualifier: Diagnosis of  By: Jerold Coombe    . ERECTILE DYSFUNCTION, MILD 05/11/2007   Qualifier: Diagnosis of  By: Sherren Mocha MD, Jory Ee   . Exertional dyspnea 10/30/2015  . GOUT 05/11/2007   Qualifier: Diagnosis of  By: Sherren Mocha MD, Jory Ee   . Hereditary and idiopathic peripheral neuropathy 09/14/2014  . History of radiation therapy 02/21/13-03/31/13   rectum 50.4Gy total dose  . Hypersomnia 10/30/2015  . Hypertension   . Lung nodule 10/30/2015  . OAB (overactive bladder) 01/09/2016  . Obesity (BMI 30.0-34.9) 10/30/2015  . Obstructive sleep apnea 07/29/2010   HST 11/2015 AHI 54.  On autocpap  >> 10 cm   . Peripheral edema 09/19/2013  . rectal ca dx'd 02/01/13   rectal. Radiation and chemotherapy- remains on chemotherapy-next tx. 08-22-13.   . S/P partial lobectomy of lung 11/20/2015  . S/P thoracotomy   . SUI (stress urinary incontinence), male 01/09/2016    PSH:  Past Surgical History:  Procedure Laterality Date  . A/V FISTULAGRAM Left 06/14/2018   Procedure: A/V FISTULAGRAM;  Surgeon: Waynetta Sandy, MD;  Location: [facility];  Service: Cardiovascular;  Laterality: Left;  . AV FISTULA PLACEMENT Left 03/24/2016   Procedure: CREATION OF LEFT BRACIOCEPHALIC ARTERIOVENOUS (AV) FISTULA;  Surgeon: Elam Dutch, MD;  Location: [facility];  Service: Vascular;  Laterality: Left;  . COLON SURGERY  05/20/2013  . COLONOSCOPY  2010   [facility]  . EUS N/A 02/03/2013   Procedure: LOWER ENDOSCOPIC ULTRASOUND (EUS);  Surgeon: Milus Banister, MD;  Location: [facility];  Service: Endoscopy;  Laterality: N/A;  . ILEOSTOMY CLOSURE N/A 08/24/2013    Procedure: CLOSURE OF LOOP ILEOSTOMY ;  Surgeon: Leighton Ruff, MD;  Location: [facility];  Service: General;  Laterality: N/A;  . IR ANGIO VERTEBRAL SEL SUBCLAVIAN INNOMINATE BILAT MOD SED  06/08/2018  . IR CT HEAD LTD  06/08/2018  . IR INTRAVSC STENT CERV CAROTID W/O EMB-PROT MOD SED INC ANGIO  06/08/2018  . IR PERCUTANEOUS ART THROMBECTOMY/INFUSION INTRACRANIAL INC DIAG ANGIO  06/08/2018  . LAPAROSCOPIC LOW ANTERIOR RESECTION N/A 05/20/2013   Procedure: LAPAROSCOPIC LOW ANTERIOR RESECTION WITH SPLENIC FLEXURE MOBILIZATION;  Surgeon: Leighton Ruff, MD;  Location: [facility];  Service: General;  Laterality: N/A;  . OSTOMY N/A 05/20/2013   Procedure: diverting OSTOMY;  Surgeon: Leighton Ruff, MD;  Location: [facility];  Service: General;  Laterality: N/A;  . PERIPHERAL VASCULAR INTERVENTION Left 06/14/2018   Procedure: PERIPHERAL VASCULAR INTERVENTION;  Surgeon: Waynetta Sandy, MD;  Location: [facility];  Service: Cardiovascular;  Laterality: Left;  AVF on the Left  . RADIOLOGY WITH ANESTHESIA N/A 06/08/2018   Procedure: IR WITH ANESTHESIA;  Surgeon: Radiologist, Medication, MD;  Location: [facility];  Service: Radiology;  Laterality: N/A;  .  VIDEO ASSISTED THORACOSCOPY (VATS)/WEDGE RESECTION Left 11/20/2015   Procedure: VIDEO ASSISTED THORACOSCOPY (VATS)/LUNG RESECTION;  Surgeon: [name], MD;  Location: [facility];  Service: Thoracic;  Laterality: Left;  Marland Kitchen VIDEO BRONCHOSCOPY N/A 11/20/2015   Procedure: VIDEO BRONCHOSCOPY;  Surgeon: [name], MD;  Location: [facility];  Service: Thoracic;  Laterality: N/A;    Social History:  Social History   Socioeconomic History  . Marital status: Married    Spouse name: [name]  . Number of children: 2  . Years of education: 51  . Highest education level: Not on file  Occupational History    Comment: retired Insurance account manager  Social Needs  . Financial resource strain: Not hard at all  . Food insecurity:    Worry: Never true    Inability: Never true   . Transportation needs:    Medical: No    Non-medical: No  Tobacco Use  . Smoking status: Former Smoker    Packs/day: 0.50    Years: 10.00    Pack years: 5.00    Types: Cigarettes    Last attempt to quit: 07/07/1977    Years since quitting: 41.1  . Smokeless tobacco: Never Used  Substance and Sexual Activity  . Alcohol use: Yes    Alcohol/week: 0.0 standard drinks    Comment: drinks occasionally  . Drug use: No  . Sexual activity: Not on file  Lifestyle  . Physical activity:    Days per week: 6 days    Minutes per session: 30 min  . Stress: Not at all  Relationships  . Social connections:    Talks on phone: More than three times a week    Gets together: More than three times a week    Attends religious service: More than 4 times per year    Active member of club or organization: Yes    Attends meetings of clubs or organizations: More than 4 times per year    Relationship status: Married  . Intimate partner violence:    Fear of current or ex partner: No    Emotionally abused: No    Physically abused: No    Forced sexual activity: No  Other Topics Concern  . Not on file  Social History Narrative   Married to wife, [name]   Retired Insurance account manager w/[employer]   #2 grown children and #6 grandchildren    Family History:  Family History  Problem Relation Age of Onset  . Stroke Mother   . Hypertension Mother   . Stroke Father   . Hypertension Father   . Hypertension Other   . Colon cancer Neg Hx     Medications:   Current Outpatient Medications on File Prior to Visit  Medication Sig Dispense Refill  . acetaminophen (TYLENOL) 325 MG tablet Take 1-2 tablets (325-650 mg total) by mouth every 4 (four) hours as needed for mild pain.    Marland Kitchen allopurinol (ZYLOPRIM) 100 MG tablet Take 1 tablet (100 mg total) by mouth daily. 30 tablet 0  . aspirin 81 MG chewable tablet Chew 1 tablet (81 mg total) by mouth daily. 100 tablet 0  . atorvastatin  (LIPITOR) 20 MG tablet Take 1 tablet (20 mg total) by mouth daily at 6 PM. 30 tablet 1  . BRILINTA 90 MG TABS tablet TAKE 1 TABLET BY MOUTH TWICE DAILY 60 tablet 0  . calcitRIOL (ROCALTROL) 0.25 MCG capsule TAKE 1 CAPSULE BY MOUTH ONCE DAILY 30 capsule 0  .  cholecalciferol (VITAMIN D) 1000 units tablet Take 5,000 Units by mouth daily.    . diphenoxylate-atropine (LOMOTIL) 2.5-0.025 MG tablet Take 1 tablet by mouth 4 (four) times daily as needed for diarrhea or loose stools. 40 tablet 0  . gabapentin (NEURONTIN) 300 MG capsule TAKE 1 CAPSULE BY MOUTH EVERY DAY AT BEDTIME. PT NEEDS APPT PRIOR TO FUTURE REFILLS 30 capsule 0  . multivitamin (RENA-VIT) TABS tablet TAKE 1 TABLET BY MOUTH AT BEDTIME 90 each 1  . polycarbophil (FIBERCON) 625 MG tablet Take 2 tablets (1,250 mg total) by mouth 2 (two) times daily.    Marland Kitchen saccharomyces boulardii (FLORASTOR) 250 MG capsule Take 1 capsule (250 mg total) by mouth 2 (two) times daily. 100 capsule 0   No current facility-administered medications on file prior to visit.     Allergies:   Allergies  Allergen Reactions  . Nsaids Other (See Comments)    Asked by surgeon to add this medication class as intolerance due to patients renal insufficiency.  . Lactose Intolerance (Gi)     Diarrhea   . Amlodipine Other (See Comments)    Edema      Physical Exam  Vitals:   08/17/18 0741  BP: 121/75  Pulse: (!) 110  Weight: 156 lb (70.8 kg)  Height: 5\' 8"  (1.727 m)   Body mass index is 23.72 kg/m. No exam data present  General: well developed, well nourished, seated, in no evident distress Head: head normocephalic and atraumatic.   Neck: supple with no carotid or supraclavicular bruits Cardiovascular: regular rate and rhythm, no murmurs Musculoskeletal: no deformity Skin:  no rash/petichiae Vascular:  Normal pulses all extremities  Neurologic Exam Mental Status: Awake and fully alert.  Moderate dysarthria with reading difficulty.  Oriented to place and  time. Recent and remote memory intact. Attention span, concentration and fund of knowledge appropriate. Mood and affect appropriate.  Cranial Nerves: Fundoscopic exam reveals sharp disc margins. Pupils equal, briskly reactive to light. Extraocular movements full without nystagmus. Visual fields full to confrontation. Hearing intact. Facial sensation intact. Face, tongue, palate moves normally and symmetrically.  Motor: Normal bulk and tone.  LUE: 4/5 with weak grip strength; LLE: 4-/5 greater in hip flexor and weak ankle dorsiflexion; full strength in right upper and lower extremity Sensory.: intact to touch , pinprick , position and vibratory sensation.  Coordination: orbits right arm over left arm; decreased left hand finger dexterity; difficulty with left leg heal down shin  Gait and Station: Arises from chair without difficulty. Stance is normal.  Gait demonstrates hemiplegic gait with favoring of left leg but is able to ambulate without assistive device.  Difficulty raising up on toes are gone back on heels and unable to perform tandem gait. Reflexes: 1+ and symmetric. Toes downgoing.    NIHSS  1 Modified Rankin  2   Diagnostic Data (Labs, Imaging, Testing)  CT HEAD WO CONTRAST 06/08/2018 IMPRESSION: 1. Hyperdense Right MCA M1 segment compatible with ELVO. Subtle cytotoxic edema suspected in the right insula, ASPECTS is 9. 2. Negative for bleed.  CT ANGIO HEAD W OR WO CONTRAST CT ANGIO NECK W OR WO CONTRAST CT CEREBRAL PERFUSION W CONTRAST 06/08/2018 IMPRESSION: 1. Positive for LVO (occluded Right ICA, Right ICA terminus and Right MCA) with CT Perfusion criteria favorable for endovascular reperfusion (core infarct estimated at 13 mL with mismatch volume 119 mL, mismatch ratio of 10.2). Preliminary report of this was discussed by telephone with Dr. Amie Portland on 06/08/2018 at 15:46. 2. Positive also for  occluded Right Vertebral Artery and soft plaque or thrombus at the Left  Vertebral Artery origin. In conjunction with #1 this might reflect sequelae of cardiac or paradoxical emboli. This was discussed with Dr. Juliet Rude in [place] at 1359 hours. 3. Superimposed intracranial atherosclerosis including moderate or severe stenosis of the: - Left ICA supraclinoid segment. - Left vertebral artery V4 segment. - Right PCA P1/P2.  MR BRAIN WO CONTRAST MR MRA HEAD  06/09/2018 IMPRESSION: 1. Patchy acute right MCA infarcts with greatest involvement of the basal ganglia. 2. Mild chronic small vessel ischemic disease. 3. Continued patency of the intracranial right ICA, right ACA, and proximal right MCA following revascularization. Artifact from right MCA stent with decreased number of distal MCA branch vessels. 4. Mild right and moderate to severe left supraclinoid ICA stenoses. 5. Moderate bilateral ACA origin stenoses. 6. Occluded distal right vertebral artery. 7. Severe proximal right P2 stenosis. 8. 2 mm right cavernous ICA aneurysm.  IR ANGIO 06/08/2018 IMPRESSION: Endovascular complete revascularization of occluded right internal carotid artery acutely with stent assisted angioplasty, and the right middle cerebral artery and the right internal carotid artery terminus, and the proximal right anterior cerebral artery with 2 passes with the 5 mm x 33 mm Embotap retrieval device, and 1 pass with the 4 mm x 40 mm X Solitaire retrieval device using a TICI 2b revascularization.  Rescue stenting of recurrent occlusion of the right middle cerebral artery in the right MCA trifurcation region with an Atlas Neuroform 4 mm x 24 mm stent.  Mechanical thrombolysis of acute occlusion of the stented segment of the right middle cerebral artery with 6 mg of super selective intracranial intra-arterial Integrilin, and mechanical thrombolysis using the microcatheter and micro guidewire achieving a TICI 2b revascularization.  Placement of an Enterprise 4 mm x 30 mm  stent at the site of a focal dissection in the mid right ICA as described.  ECHOCARDIOGRAM 06/09/2018 Study Conclusions  - Left ventricle: The cavity size was mildly dilated. Systolic   function was severely reduced. The estimated ejection fraction   was in the range of 20% to 25%. Diffuse hypokinesis. - Left atrium: The atrium was mildly dilated. - Right atrium: The atrium was mildly dilated. - Atrial septum: No defect or patent foramen ovale was identified.  Impressions:  - Findings consistent with dilated cardiomyopathy. Consider TEE if   clinically indicated. No obvious source of cerebral embolism.    ASSESSMENT: TREYTEN MONESTIME Sr. is a 73 y.o. year old male here with right MCA infarct on 06/08/2018 secondary to right ICA and MCA occlusion status post IR and rescue stents with TICI 2B reperfusion likely due to large vessel arthrosclerosis but unable to rule out cardioembolic source. Vascular risk factors include HTN, HLD, OSA on CPAP, ESRD, COPD, obesity and former tobacco/alcohol use.  He is being seen today for hospital follow-up with continued stroke deficits of left hemiparesis and dysarthria.    PLAN:  1. Right MCA infarct: Continue aspirin 81 mg daily and Brilinta  and atorvastatin 20 mg for secondary stroke prevention. Maintain strict control of hypertension with blood pressure goal below 130/90, diabetes with hemoglobin A1c goal below 6.5% and cholesterol with LDL cholesterol (bad cholesterol) goal below 70 mg/dL.  I also advised the patient to eat a healthy diet with plenty of whole grains, cereals, fruits and vegetables, exercise regularly with at least 30 minutes of continuous activity daily and maintain ideal body weight.  As cardioembolic source of stroke cannot be ruled out,  30-day cardiac event monitor ordered. 2. Post stroke left hemiparesis/dysarthria: Referral placed for outpatient PT/OT/ST 3. HTN: Advised to continue current treatment regimen.  Today's BP 121/75.   Advised to continue to monitor at home along with continued follow-up with PCP for management 4. HLD: Advised to continue current treatment regimen along with continued follow-up with PCP for future prescribing and future monitoring of lipid panel but will obtain lipid panel today as this has not been done since initiation of statin 5. Right ICA stent: Continue Brilinta along with continued follow-up with vascular surgery for monitoring and Brilinta duration 6. OSA on CPAP: Encouraged continued compliance and follow-up with OSA provider    Follow up in 3 months or call earlier if needed   Greater than 50% of time during this 25 minute visit was spent on counseling, explanation of diagnosis of right MCA infarct, reviewing risk factor management of HTN, HLD, large vessel arthrosclerosis and OSA on CPAP, planning of further management along with potential future management, and discussion with patient and family answering all questions.    Venancio Poisson, AGNP-BC  [facility] [address] [place], [place] [address]  Phone [phone] Fax [phone] Note: This document was prepared with digital dictation and possible smart phrase technology. Any transcriptional errors that result from this process are unintentional.

## 2018-08-17 NOTE — Telephone Encounter (Signed)
Pt's wife called stating that she was returning your call. Pls call her again.

## 2018-08-17 NOTE — Telephone Encounter (Signed)
Jason [name] is advised. She thanks me for the advise and the phone call.

## 2018-08-17 NOTE — Patient Instructions (Addendum)
Continue aspirin 81 mg daily and Brilinta  and atorvastatin (Lipitor) for secondary stroke prevention  Continue to follow up with PCP regarding cholesterol and blood pressure management   We will check cholesterol levels this morning   Continue follow-up with interventional radiology with repeat imaging towards the end of 09/2018  You will be called to undergo 30-day cardiac event monitor to assess for potential atrial fibrillation  Referral placed for outpatient therapies - please call by tomorrow or Thursday to schedule appointment  Continue to monitor blood pressure at home  Maintain strict control of hypertension with blood pressure goal below 130/90, diabetes with hemoglobin A1c goal below 6.5% and cholesterol with LDL cholesterol (bad cholesterol) goal below 70 mg/dL. I also advised the patient to eat a healthy diet with plenty of whole grains, cereals, fruits and vegetables, exercise regularly and maintain ideal body weight.  Followup in the future with me in 3 months or call earlier if needed       Thank you for coming to see [place] at [facility]. I hope we have been able to provide you high quality care today.  You may receive a patient satisfaction survey over the next few weeks. We would appreciate your feedback and comments so that we may continue to improve ourselves and the health of our patients.

## 2018-08-17 NOTE — Telephone Encounter (Signed)
Wife is advised of the results. Patient stopped the Protonix as discussed at the office visit. He has had a decrease in the number of bowel movements.  Please advise.

## 2018-08-17 NOTE — Telephone Encounter (Signed)
Pt called inquiring about lab results from last week.

## 2018-08-17 NOTE — Telephone Encounter (Signed)
-----   Message from Jason Craze, NP sent at 08/17/2018  3:51 PM EST ----- Diarrhea could have been from PPI or maybe it was a coincidence as he had some problems with diarrhea before then based on EMR notes. Anyway, looks like protonix was prescribed at hospital discharge in December , ? For anemia. If diarrhea truly better than okay to stay off PPI but would at least take Pepcid 20mg  daily since he reported a black stool x1 in the days prior to our visit. Let us know if any further black stools. Thanks

## 2018-08-17 NOTE — Telephone Encounter (Signed)
-----   Message from Jason Craze, NP sent at 08/16/2018  1:39 PM EST ----- Good news ! Hgb has remained stable at 9.2. Stay on PPI. Call us back ASAP if any suggestion of GI bleeding (red bleed or black stools). Thanks

## 2018-08-18 ENCOUNTER — Telehealth: Payer: Self-pay

## 2018-08-18 LAB — LIPID PANEL
Chol/HDL Ratio: 2.3 ratio (ref 0.0–5.0)
Cholesterol, Total: 151 mg/dL (ref 100–199)
HDL: 66 mg/dL (ref >39)
LDL Calculated: 62 mg/dL (ref 0–99)
Triglycerides: 114 mg/dL (ref 0–149)
VLDL Cholesterol Cal: 23 mg/dL (ref 5–40)

## 2018-08-18 NOTE — Progress Notes (Signed)
I agree with the above plan 

## 2018-08-18 NOTE — Progress Notes (Signed)
____________________________________________________________  Attending physician addendum:  Thank you for sending this case to me. I have reviewed the entire note, and the outlined plan seems appropriate.  Jette Lewan Danis, MD  ____________________________________________________________  

## 2018-08-18 NOTE — Telephone Encounter (Signed)
I called wife on dpr. I stated recent cholesterol panel shows adequate improvement of LDL or bad cholesterol at 62 with goal less than 70. Advised him to continue to monitor his current treatment regimen and to follow-up with PCP for future management and monitoring.The wife verbalized understanding. ------

## 2018-08-18 NOTE — Telephone Encounter (Signed)
-----   Message from Venancio Poisson, NP sent at 08/18/2018  8:25 AM EST ----- Please advise patient that his recent cholesterol panel shows adequate improvement of LDL or bad cholesterol at 62 with goal less than 70.  Advised him to continue to monitor his current treatment regimen and to follow-up with PCP for future management and monitoring

## 2018-08-19 DIAGNOSIS — D631 Anemia in chronic kidney disease: Secondary | ICD-10-CM | POA: Diagnosis not present

## 2018-08-19 DIAGNOSIS — R197 Diarrhea, unspecified: Secondary | ICD-10-CM | POA: Diagnosis not present

## 2018-08-19 DIAGNOSIS — D689 Coagulation defect, unspecified: Secondary | ICD-10-CM | POA: Diagnosis not present

## 2018-08-19 DIAGNOSIS — D509 Iron deficiency anemia, unspecified: Secondary | ICD-10-CM | POA: Diagnosis not present

## 2018-08-19 DIAGNOSIS — N186 End stage renal disease: Secondary | ICD-10-CM | POA: Diagnosis not present

## 2018-08-19 DIAGNOSIS — L299 Pruritus, unspecified: Secondary | ICD-10-CM | POA: Diagnosis not present

## 2018-08-19 DIAGNOSIS — N2581 Secondary hyperparathyroidism of renal origin: Secondary | ICD-10-CM | POA: Diagnosis not present

## 2018-08-19 DIAGNOSIS — R52 Pain, unspecified: Secondary | ICD-10-CM | POA: Diagnosis not present

## 2018-08-19 DIAGNOSIS — Z23 Encounter for immunization: Secondary | ICD-10-CM | POA: Diagnosis not present

## 2018-08-20 ENCOUNTER — Ambulatory Visit: Payer: Medicare Other | Admitting: Oncology

## 2018-08-21 DIAGNOSIS — L299 Pruritus, unspecified: Secondary | ICD-10-CM | POA: Diagnosis not present

## 2018-08-21 DIAGNOSIS — R197 Diarrhea, unspecified: Secondary | ICD-10-CM | POA: Diagnosis not present

## 2018-08-21 DIAGNOSIS — D689 Coagulation defect, unspecified: Secondary | ICD-10-CM | POA: Diagnosis not present

## 2018-08-21 DIAGNOSIS — D631 Anemia in chronic kidney disease: Secondary | ICD-10-CM | POA: Diagnosis not present

## 2018-08-21 DIAGNOSIS — D509 Iron deficiency anemia, unspecified: Secondary | ICD-10-CM | POA: Diagnosis not present

## 2018-08-21 DIAGNOSIS — Z23 Encounter for immunization: Secondary | ICD-10-CM | POA: Diagnosis not present

## 2018-08-21 DIAGNOSIS — R52 Pain, unspecified: Secondary | ICD-10-CM | POA: Diagnosis not present

## 2018-08-21 DIAGNOSIS — N186 End stage renal disease: Secondary | ICD-10-CM | POA: Diagnosis not present

## 2018-08-21 DIAGNOSIS — N2581 Secondary hyperparathyroidism of renal origin: Secondary | ICD-10-CM | POA: Diagnosis not present

## 2018-08-22 ENCOUNTER — Other Ambulatory Visit: Payer: Self-pay | Admitting: Internal Medicine

## 2018-08-23 ENCOUNTER — Other Ambulatory Visit (HOSPITAL_COMMUNITY): Payer: Self-pay | Admitting: Physical Medicine & Rehabilitation

## 2018-08-23 ENCOUNTER — Telehealth: Payer: Self-pay | Admitting: Nurse Practitioner

## 2018-08-23 NOTE — Telephone Encounter (Signed)
No answer. Voicemail states "mailbox is full" No message can be left for the return call.

## 2018-08-23 NOTE — Telephone Encounter (Signed)
No answer

## 2018-08-23 NOTE — Telephone Encounter (Signed)
I will get him back in here. Can he see Dr Loletha Carrow if there is an opening?

## 2018-08-23 NOTE — Telephone Encounter (Signed)
PT wife returning your call from last week. Phone # [phone]

## 2018-08-23 NOTE — Telephone Encounter (Signed)
Patient is taking Fibercon. Wife asks if this could contribute to the diarrhea issue. She will hold the [employer] and tomorrow to see if it does. Follow up by phone.

## 2018-08-23 NOTE — Telephone Encounter (Signed)
Next available.  In the meantime, start metronidazole 500 mg, 1 tablet 3 times daily for 10 days. He could have some kind of lingering infection.

## 2018-08-23 NOTE — Telephone Encounter (Signed)
She can hold the fibercon. He is going to need further workup. Nephrology called me, they had to lower his dry weight. Still having a lot of diarrhea. Beth, can you get him back in to see me?   Thanks

## 2018-08-24 ENCOUNTER — Other Ambulatory Visit: Payer: Self-pay

## 2018-08-24 DIAGNOSIS — Z23 Encounter for immunization: Secondary | ICD-10-CM | POA: Diagnosis not present

## 2018-08-24 DIAGNOSIS — R197 Diarrhea, unspecified: Secondary | ICD-10-CM | POA: Diagnosis not present

## 2018-08-24 DIAGNOSIS — D689 Coagulation defect, unspecified: Secondary | ICD-10-CM | POA: Diagnosis not present

## 2018-08-24 DIAGNOSIS — N186 End stage renal disease: Secondary | ICD-10-CM | POA: Diagnosis not present

## 2018-08-24 DIAGNOSIS — L299 Pruritus, unspecified: Secondary | ICD-10-CM | POA: Diagnosis not present

## 2018-08-24 DIAGNOSIS — D631 Anemia in chronic kidney disease: Secondary | ICD-10-CM | POA: Diagnosis not present

## 2018-08-24 DIAGNOSIS — R52 Pain, unspecified: Secondary | ICD-10-CM | POA: Diagnosis not present

## 2018-08-24 DIAGNOSIS — N2581 Secondary hyperparathyroidism of renal origin: Secondary | ICD-10-CM | POA: Diagnosis not present

## 2018-08-24 DIAGNOSIS — D509 Iron deficiency anemia, unspecified: Secondary | ICD-10-CM | POA: Diagnosis not present

## 2018-08-24 MED ORDER — METRONIDAZOLE 500 MG PO TABS: 500.0000 mg | ORAL_TABLET | Freq: Three times a day (TID) | ORAL | 0 refills | Status: DC

## 2018-08-24 NOTE — Telephone Encounter (Signed)
Jason [name] calls me back. Reports he has continued to have diarrhea daily. He is willing to try Flagyl 500 mg TID for 10 days as recommended by Dr Loletha Carrow. Rx to [facility]. Appointment for follow up on 09/03/18 at 2:00 pm with Dr Loletha Carrow. The patient has dialysis on Tuesdays, Thursdays, and Saturdays. Agrees to call if he has questions or issues before the appointment.

## 2018-08-24 NOTE — Telephone Encounter (Signed)
Voice mail is full  

## 2018-08-24 NOTE — Telephone Encounter (Signed)
No answer.Voicemail is full. °

## 2018-08-26 DIAGNOSIS — N186 End stage renal disease: Secondary | ICD-10-CM | POA: Diagnosis not present

## 2018-08-26 DIAGNOSIS — R52 Pain, unspecified: Secondary | ICD-10-CM | POA: Diagnosis not present

## 2018-08-26 DIAGNOSIS — D509 Iron deficiency anemia, unspecified: Secondary | ICD-10-CM | POA: Diagnosis not present

## 2018-08-26 DIAGNOSIS — D689 Coagulation defect, unspecified: Secondary | ICD-10-CM | POA: Diagnosis not present

## 2018-08-26 DIAGNOSIS — R197 Diarrhea, unspecified: Secondary | ICD-10-CM | POA: Diagnosis not present

## 2018-08-26 DIAGNOSIS — D631 Anemia in chronic kidney disease: Secondary | ICD-10-CM | POA: Diagnosis not present

## 2018-08-26 DIAGNOSIS — L299 Pruritus, unspecified: Secondary | ICD-10-CM | POA: Diagnosis not present

## 2018-08-26 DIAGNOSIS — N2581 Secondary hyperparathyroidism of renal origin: Secondary | ICD-10-CM | POA: Diagnosis not present

## 2018-08-26 DIAGNOSIS — Z23 Encounter for immunization: Secondary | ICD-10-CM | POA: Diagnosis not present

## 2018-08-28 DIAGNOSIS — D689 Coagulation defect, unspecified: Secondary | ICD-10-CM | POA: Diagnosis not present

## 2018-08-28 DIAGNOSIS — R52 Pain, unspecified: Secondary | ICD-10-CM | POA: Diagnosis not present

## 2018-08-28 DIAGNOSIS — N186 End stage renal disease: Secondary | ICD-10-CM | POA: Diagnosis not present

## 2018-08-28 DIAGNOSIS — N2581 Secondary hyperparathyroidism of renal origin: Secondary | ICD-10-CM | POA: Diagnosis not present

## 2018-08-28 DIAGNOSIS — L299 Pruritus, unspecified: Secondary | ICD-10-CM | POA: Diagnosis not present

## 2018-08-28 DIAGNOSIS — D631 Anemia in chronic kidney disease: Secondary | ICD-10-CM | POA: Diagnosis not present

## 2018-08-28 DIAGNOSIS — Z23 Encounter for immunization: Secondary | ICD-10-CM | POA: Diagnosis not present

## 2018-08-28 DIAGNOSIS — D509 Iron deficiency anemia, unspecified: Secondary | ICD-10-CM | POA: Diagnosis not present

## 2018-08-28 DIAGNOSIS — R197 Diarrhea, unspecified: Secondary | ICD-10-CM | POA: Diagnosis not present

## 2018-08-29 ENCOUNTER — Other Ambulatory Visit (HOSPITAL_COMMUNITY): Payer: Self-pay | Admitting: Internal Medicine

## 2018-08-30 ENCOUNTER — Other Ambulatory Visit: Payer: Self-pay

## 2018-08-30 NOTE — Patient Outreach (Signed)
Telephone outreach to patient to obtain mRs was successfully completed. mRs= 3. Spoke with wife on DPR to obtain score.

## 2018-08-30 NOTE — Patient Outreach (Signed)
First attempt to obtain mrs. Patient was on the way to dialysis and stated either he or his wife would call me back.

## 2018-08-31 DIAGNOSIS — R52 Pain, unspecified: Secondary | ICD-10-CM | POA: Diagnosis not present

## 2018-08-31 DIAGNOSIS — N186 End stage renal disease: Secondary | ICD-10-CM | POA: Diagnosis not present

## 2018-08-31 DIAGNOSIS — D509 Iron deficiency anemia, unspecified: Secondary | ICD-10-CM | POA: Diagnosis not present

## 2018-08-31 DIAGNOSIS — L299 Pruritus, unspecified: Secondary | ICD-10-CM | POA: Diagnosis not present

## 2018-08-31 DIAGNOSIS — D689 Coagulation defect, unspecified: Secondary | ICD-10-CM | POA: Diagnosis not present

## 2018-08-31 DIAGNOSIS — R197 Diarrhea, unspecified: Secondary | ICD-10-CM | POA: Diagnosis not present

## 2018-08-31 DIAGNOSIS — N2581 Secondary hyperparathyroidism of renal origin: Secondary | ICD-10-CM | POA: Diagnosis not present

## 2018-08-31 DIAGNOSIS — Z23 Encounter for immunization: Secondary | ICD-10-CM | POA: Diagnosis not present

## 2018-08-31 DIAGNOSIS — D631 Anemia in chronic kidney disease: Secondary | ICD-10-CM | POA: Diagnosis not present

## 2018-09-02 ENCOUNTER — Other Ambulatory Visit: Payer: Self-pay | Admitting: Internal Medicine

## 2018-09-02 DIAGNOSIS — D509 Iron deficiency anemia, unspecified: Secondary | ICD-10-CM | POA: Diagnosis not present

## 2018-09-02 DIAGNOSIS — D689 Coagulation defect, unspecified: Secondary | ICD-10-CM | POA: Diagnosis not present

## 2018-09-02 DIAGNOSIS — Z23 Encounter for immunization: Secondary | ICD-10-CM | POA: Diagnosis not present

## 2018-09-02 DIAGNOSIS — L299 Pruritus, unspecified: Secondary | ICD-10-CM | POA: Diagnosis not present

## 2018-09-02 DIAGNOSIS — R52 Pain, unspecified: Secondary | ICD-10-CM | POA: Diagnosis not present

## 2018-09-02 DIAGNOSIS — R197 Diarrhea, unspecified: Secondary | ICD-10-CM | POA: Diagnosis not present

## 2018-09-02 DIAGNOSIS — N2581 Secondary hyperparathyroidism of renal origin: Secondary | ICD-10-CM | POA: Diagnosis not present

## 2018-09-02 DIAGNOSIS — N186 End stage renal disease: Secondary | ICD-10-CM | POA: Diagnosis not present

## 2018-09-02 DIAGNOSIS — D631 Anemia in chronic kidney disease: Secondary | ICD-10-CM | POA: Diagnosis not present

## 2018-09-02 MED ORDER — ALLOPURINOL 100 MG PO TABS: 100.0000 mg | ORAL_TABLET | Freq: Every day | ORAL | 1 refills | Status: DC

## 2018-09-02 NOTE — Telephone Encounter (Signed)
Copied from [place] [id number]. Topic: General - Other >> Sep 02, 2018  1:01 PM Lennox Solders wrote: Reason for CRM: pt wife is calling and he needs a refill on allopurinol . [facility] [address]

## 2018-09-03 ENCOUNTER — Encounter: Payer: Self-pay | Admitting: Nephrology

## 2018-09-03 ENCOUNTER — Telehealth: Payer: Self-pay

## 2018-09-03 ENCOUNTER — Ambulatory Visit: Payer: Medicare Other | Admitting: Gastroenterology

## 2018-09-03 ENCOUNTER — Encounter: Payer: Self-pay | Admitting: Gastroenterology

## 2018-09-03 ENCOUNTER — Telehealth: Payer: Self-pay | Admitting: Gastroenterology

## 2018-09-03 VITALS — BP 104/58 | HR 66 | Ht 68.0 in | Wt 155.4 lb

## 2018-09-03 DIAGNOSIS — K529 Noninfective gastroenteritis and colitis, unspecified: Secondary | ICD-10-CM

## 2018-09-03 DIAGNOSIS — R634 Abnormal weight loss: Secondary | ICD-10-CM | POA: Diagnosis not present

## 2018-09-03 DIAGNOSIS — Z992 Dependence on renal dialysis: Secondary | ICD-10-CM

## 2018-09-03 DIAGNOSIS — I5020 Unspecified systolic (congestive) heart failure: Secondary | ICD-10-CM

## 2018-09-03 DIAGNOSIS — I6310 Cerebral infarction due to embolism of unspecified precerebral artery: Secondary | ICD-10-CM

## 2018-09-03 DIAGNOSIS — N186 End stage renal disease: Secondary | ICD-10-CM | POA: Diagnosis not present

## 2018-09-03 DIAGNOSIS — D649 Anemia, unspecified: Secondary | ICD-10-CM

## 2018-09-03 MED ORDER — METRONIDAZOLE 500 MG PO TABS: 500.0000 mg | ORAL_TABLET | Freq: Three times a day (TID) | ORAL | 0 refills | Status: DC

## 2018-09-03 MED ORDER — DIPHENOXYLATE-ATROPINE 2.5-0.025 MG PO TABS: 2.0000 | ORAL_TABLET | Freq: Four times a day (QID) | ORAL | 2 refills | Status: DC | PRN

## 2018-09-03 MED ORDER — METRONIDAZOLE 500 MG PO TABS: 500.0000 mg | ORAL_TABLET | Freq: Three times a day (TID) | ORAL | 0 refills | Status: AC

## 2018-09-03 NOTE — Patient Instructions (Signed)
You have been scheduled for an endoscopy and colonoscopy. Please follow the written instructions given to you at your visit today. Please pick up your prep supplies at the pharmacy within the next 1-3 days. If you use inhalers (even only as needed), please bring them with you on the day of your procedure. Your physician has requested that you go to www.[email or web address].com and enter the access code given to you at your visit today. This web site gives a general overview about your procedure. However, you should still follow specific instructions given to you by our office regarding your preparation for the procedure.  It was a pleasure to see you today!  Dr. Loletha Carrow

## 2018-09-03 NOTE — Telephone Encounter (Signed)
Advised that script should read "metronidazole 500 mg 1 tablet three times daily x 10 days." I have sent an updated script electronically to reflect this request. Pharmacist verbalizes understanding.

## 2018-09-03 NOTE — Progress Notes (Signed)
[facility] Progress Note  Chief Complaint: Chronic diarrhea and weight loss  Subjective  History:  [name] is here with his wife today, and they are very concerned that his ongoing diarrhea.  He is a very limited historian, his wife is able to assist with that.  She indicates that he would have occasional loose stool up until the time of his December hospitalization, when he had a CVA requiring cerebral artery thrombectomy and subsequent anticoagulation.  Since then, he has had at least 5-6 semi-formed to loose BMs per day with some episodes of incontinence and also nocturnal diarrhea.  There was an ED visit for GI bleeding, unclear if it was rectal bleeding or melena.  Those reports are on file and mentioned in our recent APP clinic note. He is also lost a significant amount of weight as described below.  That weight loss seems to have started shortly before his December hospitalization.  His appetite is "fair", and he denies nausea or vomiting.  He has no abdominal pain.  Often he does not even feel the urgency for a bowel movement might have incontinence. Of note, he had a C. difficile infection in 2015, recent negative C. difficile toxins as noted below.  He continues dialysis Tuesday Thursday Saturday, and his nephrologist contacted [place] note that his dry weight was really decreasing.  Past Medical History:  Diagnosis Date  . Anemia   . Arthritis   . Chronic renal insufficiency 07/31/2011  . COPD (chronic obstructive pulmonary disease) ([place])    pt denies  . CVA (cerebral vascular accident) ([place]) 06/2018  . Deficiency anemia 07/20/2012  . Diverticulosis   . Edema 09/21/2010   Qualifier: Diagnosis of  By: Jerold Coombe    . ERECTILE DYSFUNCTION, MILD 05/11/2007   Qualifier: Diagnosis of  By: Sherren Mocha MD, Jory Ee   . Exertional dyspnea 10/30/2015  . GOUT 05/11/2007   Qualifier: Diagnosis of  By: Sherren Mocha MD, Jory Ee   . Hereditary and idiopathic peripheral neuropathy 09/14/2014   . History of radiation therapy 02/21/13-03/31/13   rectum 50.4Gy total dose  . Hypersomnia 10/30/2015  . Hypertension   . Lung nodule 10/30/2015  . OAB (overactive bladder) 01/09/2016  . Obesity (BMI 30.0-34.9) 10/30/2015  . Obstructive sleep apnea 07/29/2010   HST 11/2015 AHI 54.  On autocpap  >> 10 cm   . Peripheral edema 09/19/2013  . rectal ca dx'd 02/01/13   rectal. Radiation and chemotherapy- remains on chemotherapy-next tx. 08-22-13.   . S/P partial lobectomy of lung 11/20/2015  . S/P thoracotomy   . SUI (stress urinary incontinence), male 01/09/2016    ROS: Cardiovascular:  no chest pain Respiratory: Chronic dyspnea Generalized fatigue Remainder of systems negative except as above  The patient's Past Medical, Family and Social History were reviewed and are on file in the EMR.  Objective:  Med list reviewed  Current Outpatient Medications:  .  acetaminophen (TYLENOL) 325 MG tablet, Take 1-2 tablets (325-650 mg total) by mouth every 4 (four) hours as needed for mild pain., Disp: , Rfl:  .  allopurinol (ZYLOPRIM) 100 MG tablet, Take 1 tablet (100 mg total) by mouth daily., Disp: 30 tablet, Rfl: 1 .  aspirin 81 MG chewable tablet, Chew 1 tablet (81 mg total) by mouth daily., Disp: 100 tablet, Rfl: 0 .  atorvastatin (LIPITOR) 20 MG tablet, TAKE 1 TABLET BY MOUTH  DAILY AT 6 PM., Disp: 90 tablet, Rfl: 1 .  BRILINTA 90 MG TABS tablet, TAKE  1 TABLET BY MOUTH TWICE DAILY, Disp: 60 tablet, Rfl: 0 .  calcitRIOL (ROCALTROL) 0.25 MCG capsule, TAKE 1 CAPSULE BY MOUTH ONCE DAILY, Disp: 30 capsule, Rfl: 5 .  cholecalciferol (VITAMIN D) 1000 units tablet, Take 5,000 Units by mouth daily., Disp: , Rfl:  .  gabapentin (NEURONTIN) 300 MG capsule, TAKE 1 CAPSULE BY MOUTH EVERY DAY AT BEDTIME. PT NEEDS APPT PRIOR TO FUTURE REFILLS, Disp: 30 capsule, Rfl: 0 .  multivitamin (RENA-VIT) TABS tablet, TAKE 1 TABLET BY MOUTH AT BEDTIME, Disp: 90 each, Rfl: 1 .  polycarbophil (FIBERCON) 625 MG tablet, Take 2  tablets (1,250 mg total) by mouth 2 (two) times daily., Disp: , Rfl:  .  saccharomyces boulardii (FLORASTOR) 250 MG capsule, Take 1 capsule (250 mg total) by mouth 2 (two) times daily., Disp: 100 capsule, Rfl: 0 .  diphenoxylate-atropine (LOMOTIL) 2.5-0.025 MG tablet, Take 2 tablets by mouth 4 (four) times daily as needed for diarrhea or loose stools., Disp: 60 tablet, Rfl: 2 .  metroNIDAZOLE (FLAGYL) 500 MG tablet, Take 1 tablet (500 mg total) by mouth 3 (three) times daily for 10 days. One to two tablets as needed, Disp: 30 tablet, Rfl: 0   Vital signs in last 24 hrs: Vitals:   09/03/18 1350  BP: (!) 104/58  Pulse: 66  wt 163 07/28/18,  160 08/13/18,  155 today Oct 2019 206#,  Nov 2019 183# Physical Exam  He is chronically ill-appearing and thin, restricted affect.  His pants are very loose, with new holes notched in his belt.  HEENT: sclera anicteric, oral mucosa moist without lesions  Neck: supple, no thyromegaly, JVD or lymphadenopathy  Cardiac: RRR without murmurs, S1S2 heard, no peripheral edema  Pulm: clear to auscultation bilaterally, normal RR and effort noted  Abdomen: soft, no tenderness, with active bowel sounds. No guarding or palpable hepatosplenomegaly.  Skin; warm and dry, no jaundice or rash  Recent Labs:  Fecal lactoferrin positive 07/21/18 C diff toxin negatie in GI pathogen panel 07/21/18 (no C diff PCR done)  CBC Latest Ref Rng & Units 08/10/2018 08/06/2018 07/28/2018  WBC 4.0 - 10.5 K/uL 6.8 8.3 10.8(H)  Hemoglobin 13.0 - 17.0 g/dL 9.2(L) 9.2(L) 9.1(L)  Hematocrit 39.0 - 52.0 % 28.3(L) 28.4(L) 31.0(L)  Platelets 150.0 - 400.0 K/uL 267.0 303.0 377    Radiologic studies:  CLINICAL DATA:  Rectal cancer. Status post resection and chemo radiation.   EXAM: CT CHEST, ABDOMEN AND PELVIS WITHOUT CONTRAST   TECHNIQUE: Multidetector CT imaging of the chest, abdomen and pelvis was performed following the standard protocol without IV contrast.   COMPARISON:   12/01/2017   FINDINGS: CT CHEST FINDINGS   Cardiovascular: The heart size is normal. No substantial pericardial effusion. Coronary artery calcification is evident. Atherosclerotic calcification is noted in the wall of the thoracic aorta.   Mediastinum/Nodes: No mediastinal lymphadenopathy. No evidence for gross hilar lymphadenopathy although assessment is limited by the lack of intravenous contrast on today's study. The esophagus has normal imaging features. There is no axillary lymphadenopathy.   Lungs/Pleura: The The central tracheobronchial airways are patent. Clustered nodularity in the right upper lobe is similar with 9 mm index nodule measured on the prior study slightly smaller today at 7 mm. Staple line posterior left upper lobe compatible with prior wedge resection and nodular soft tissue associated with the inferior margin of the staple line is unchanged. Calcified pleural plaques are evident bilaterally. No pleural effusion.   Musculoskeletal: No worrisome lytic or sclerotic osseous abnormality.  CT ABDOMEN PELVIS FINDINGS   Hepatobiliary: No focal abnormality in the liver on this study without intravenous contrast. There is no evidence for gallstones, gallbladder wall thickening, or pericholecystic fluid. No intrahepatic or extrahepatic biliary dilation.   Pancreas: No focal mass lesion. No dilatation of the main duct. No intraparenchymal cyst. No peripancreatic edema.   Spleen: No splenomegaly. No focal mass lesion.   Adrenals/Urinary Tract: No adrenal nodule or mass. Multiple lesions of varying size and attenuation are identified in both kidneys as before. Some of these have very high attenuation and are most likely cysts complicated by proteinaceous debris or hemorrhage. Others have intermediate attenuation, including the 4.1 x 3.7 cm interpolar right renal lesion. While these may be complicated cysts, neoplasm could also have this appearance. Other lesions  in the kidneys approach water attenuation and are likely cysts. No evidence for hydroureter. Circumferential bladder wall thickening is associated with subtle perivesicular edema.   Stomach/Bowel: Stomach is unremarkable. No gastric wall thickening. No evidence of outlet obstruction. Duodenum is normally positioned as is the ligament of Treitz. No small bowel wall thickening. No small bowel dilatation. The terminal ileum is normal. The appendix is normal. Right-sided predominant colonic diverticulosis evident.   Vascular/Lymphatic: There is abdominal aortic atherosclerosis without aneurysm. There is no gastrohepatic or hepatoduodenal ligament lymphadenopathy. No intraperitoneal or retroperitoneal lymphadenopathy. No pelvic sidewall lymphadenopathy.   Reproductive: Prostate gland is enlarged.   Other: No intraperitoneal free fluid.   Musculoskeletal: No worrisome lytic or sclerotic osseous abnormality. Changes of avascular necrosis noted left femoral head.   IMPRESSION: 1. No new or progressive interval findings to suggest metastatic disease in the chest, abdomen, or pelvis. 2. Circumferential bladder wall thickening with subtle perivesicular edema, new in the interval. Infection/inflammation is a primary consideration. 3. Index nodule right upper lobe and soft tissue fullness associated with the inferior aspect of the left upper lobe staple line are stable in the interval. 4. Multiple bilateral renal lesions of varying size and attenuation. Some of these lesions are compatible with simple cysts while others are likely cysts complicated by proteinaceous debris or hemorrhage. Some of the intermediate attenuation lesions, although stable in size, can not be fully characterized and are in the noncontrast CT imaging spectrum for neoplasm. MRI without with contrast would be the study of choice to further characterize. 5. Avascular necrosis left femoral head. 6.  Aortic  Atherosclerois (ICD10-170.0)    Electronically Signed   By: Misty Stanley M.D.   On: 08/11/2018 14:40   @ASSESSMENTPLANBEGIN @ Assessment: Encounter Diagnoses  Name Primary?  . Chronic diarrhea Yes  . Weight loss   . Normocytic anemia   . ESRD on dialysis ([place])   . Systolic congestive heart failure, unspecified HF chronicity ([place])   . Cerebrovascular accident (CVA) due to embolism of precerebral artery ([facility])    Chronically ill elderly man with multiple medical problems, now with ongoing diarrhea for at least 2 months and significant weight loss.  It is unclear if those 2 things are related, but I think at this point we must assume that they are. The circumstances of the GI bleeding are unclear.  I do not see reports that he had overt GI bleeding during his hospitalization for CVA/thrombectomy and institution of antiplatelet therapy.  Plan: Upper endoscopy and colonoscopy.  We must consider small bowel sources given the profound weight loss.  In the process, we can evaluate for any GI blood loss source of the anemia.  This procedure must be done in the  hospital endoscopy lab due to the patient's medical complexity, fairly recent CVA, and low ejection fraction.  It is therefore an increased risk procedure as well.  The benefits and risks of the planned procedure were described in detail with the patient or (when appropriate) their health care proxy.  Risks were outlined as including, but not limited to, bleeding, infection, perforation, adverse medication reaction leading to cardiac or pulmonary decompensation.  The limitation of incomplete mucosal visualization was also discussed.  No guarantees or warranties were given.  He will need to be off Brilinta 5 days prior.  He and his wife understand the risk of stroke while he is briefly off this medicine.  We will send a message to his neurologist, and I expect they will have the same cautionary advice.  It is not clear if he has continued to  take the Lomotil, as his wife was uncertain whether they still had any of it.  I therefore gave him a new prescription, with instructions he could take 2 tablets up to 4 times a day as needed for diarrhea.  He has a history of C. difficile diarrhea.  Is sensitive is the stool toxin is, there is the possibility of a false negative.  Given that and as bad as the diarrhea has been along with weight loss, I have opted to give him an empiric 10-day course of metronidazole 500 mg 3 times daily.  Total time 40 minutes, over half spent face-to-face with patient in counseling and coordination of care.   [name]

## 2018-09-03 NOTE — Telephone Encounter (Signed)
[facility] Pre-operative Risk Assessment     Request for surgical clearance:     Endoscopy Procedure  What type of surgery is being performed?     Upper Endoscopy/Colonoscopy  When is this surgery scheduled?     09/17/18  What type of clearance is required ?   Pharmacy  Are there any medications that need to be held prior to surgery and how long? Brilinta x 5 days  Practice name and name of physician performing surgery?      [facility]  What is your office phone and fax number?      Phone- [phone]  [phone]  Anesthesia type (None, local, MAC, general) ?       MAC

## 2018-09-03 NOTE — Telephone Encounter (Signed)
Pharm called and needed clarification on how the prescription was written.She will like to know if it was 1-2 tabs 3x's a day for 10 days? or 1 tab 3 x's day for 10 days? Please call and advise

## 2018-09-04 DIAGNOSIS — N186 End stage renal disease: Secondary | ICD-10-CM | POA: Diagnosis not present

## 2018-09-04 DIAGNOSIS — R52 Pain, unspecified: Secondary | ICD-10-CM | POA: Diagnosis not present

## 2018-09-04 DIAGNOSIS — I129 Hypertensive chronic kidney disease with stage 1 through stage 4 chronic kidney disease, or unspecified chronic kidney disease: Secondary | ICD-10-CM | POA: Diagnosis not present

## 2018-09-04 DIAGNOSIS — Z992 Dependence on renal dialysis: Secondary | ICD-10-CM | POA: Diagnosis not present

## 2018-09-04 DIAGNOSIS — R197 Diarrhea, unspecified: Secondary | ICD-10-CM | POA: Diagnosis not present

## 2018-09-04 DIAGNOSIS — N2581 Secondary hyperparathyroidism of renal origin: Secondary | ICD-10-CM | POA: Diagnosis not present

## 2018-09-04 DIAGNOSIS — D631 Anemia in chronic kidney disease: Secondary | ICD-10-CM | POA: Diagnosis not present

## 2018-09-04 DIAGNOSIS — D689 Coagulation defect, unspecified: Secondary | ICD-10-CM | POA: Diagnosis not present

## 2018-09-04 DIAGNOSIS — D509 Iron deficiency anemia, unspecified: Secondary | ICD-10-CM | POA: Diagnosis not present

## 2018-09-04 DIAGNOSIS — L299 Pruritus, unspecified: Secondary | ICD-10-CM | POA: Diagnosis not present

## 2018-09-04 DIAGNOSIS — Z23 Encounter for immunization: Secondary | ICD-10-CM | POA: Diagnosis not present

## 2018-09-05 NOTE — Telephone Encounter (Signed)
Patient had a stroke on 06/08/18 and max risk period for recurrence is first 3-6 months.Unless procedure is urgent recommend not stop brillinta as he has a fresh stent and risk of reocclusion is less after first 6 months

## 2018-09-06 ENCOUNTER — Other Ambulatory Visit: Payer: Self-pay | Admitting: Adult Health

## 2018-09-06 ENCOUNTER — Ambulatory Visit: Payer: Medicare Other | Admitting: Occupational Therapy

## 2018-09-06 ENCOUNTER — Ambulatory Visit (INDEPENDENT_AMBULATORY_CARE_PROVIDER_SITE_OTHER): Payer: Medicare Other

## 2018-09-06 ENCOUNTER — Encounter: Payer: Self-pay | Admitting: Speech Pathology

## 2018-09-06 ENCOUNTER — Other Ambulatory Visit: Payer: Self-pay

## 2018-09-06 ENCOUNTER — Ambulatory Visit: Payer: Medicare Other | Attending: Adult Health | Admitting: Rehabilitative and Restorative Service Providers"

## 2018-09-06 ENCOUNTER — Ambulatory Visit: Payer: Medicare Other | Admitting: Speech Pathology

## 2018-09-06 DIAGNOSIS — R41841 Cognitive communication deficit: Secondary | ICD-10-CM

## 2018-09-06 DIAGNOSIS — R471 Dysarthria and anarthria: Secondary | ICD-10-CM | POA: Diagnosis not present

## 2018-09-06 DIAGNOSIS — I4891 Unspecified atrial fibrillation: Secondary | ICD-10-CM | POA: Diagnosis not present

## 2018-09-06 DIAGNOSIS — I69354 Hemiplegia and hemiparesis following cerebral infarction affecting left non-dominant side: Secondary | ICD-10-CM

## 2018-09-06 DIAGNOSIS — I69318 Other symptoms and signs involving cognitive functions following cerebral infarction: Secondary | ICD-10-CM | POA: Diagnosis not present

## 2018-09-06 DIAGNOSIS — I63511 Cerebral infarction due to unspecified occlusion or stenosis of right middle cerebral artery: Secondary | ICD-10-CM

## 2018-09-06 DIAGNOSIS — R480 Dyslexia and alexia: Secondary | ICD-10-CM

## 2018-09-06 DIAGNOSIS — R2681 Unsteadiness on feet: Secondary | ICD-10-CM

## 2018-09-06 DIAGNOSIS — M6281 Muscle weakness (generalized): Secondary | ICD-10-CM | POA: Diagnosis not present

## 2018-09-06 DIAGNOSIS — R2689 Other abnormalities of gait and mobility: Secondary | ICD-10-CM | POA: Diagnosis not present

## 2018-09-06 DIAGNOSIS — I639 Cerebral infarction, unspecified: Secondary | ICD-10-CM

## 2018-09-06 NOTE — Telephone Encounter (Signed)
Jason [name],    Please see response from patient's neurologist advising against temporary hold of Brilinta before 3/13 EGD/colonoscopy.    Please call patient (he may want you to talk to his wife) to let them know this.  Unless the metronidazole I gave him last week has given significant improvement in the diarrhea, I feel we must go ahead with the EGD and colonoscopy as scheduled (with him still on aspirin and Brilinta) because Jason [name] has had such severe diarrhea and weight loss.  This increases risk of procedure and bleeding if any biopsies taken, but we very much need to find an explanation for his symptoms.

## 2018-09-06 NOTE — Therapy (Signed)
[facility] [address] [place], [place], [address] Phone: [phone]   Fax:  [phone]  Occupational Therapy Evaluation  Patient Details  Name: Jason WOOLSEY Sr. MRN: [id number] Date of Birth: Jun 19, 1946 Referring Provider (OT): Venancio Poisson    Encounter Date: 09/06/2018  OT End of Session - 09/06/18 0833    Visit Number  1    Number of Visits  9    Date for OT Re-Evaluation  10/07/18    Authorization Type  [employer] MCR    Authorization - Visit Number  1    Authorization - Number of Visits  10    OT Start Time  0800    OT Stop Time  0840    OT Time Calculation (min)  40 min    Activity Tolerance  Patient tolerated treatment well    Behavior During Therapy  WFL for tasks assessed/performed       Past Medical History:  Diagnosis Date  . Anemia   . Arthritis   . Chronic renal insufficiency 07/31/2011  . COPD (chronic obstructive pulmonary disease) ([name])    pt denies  . CVA (cerebral vascular accident) ([place]) 06/2018  . Deficiency anemia 07/20/2012  . Diverticulosis   . Edema 09/21/2010   Qualifier: Diagnosis of  By: Jerold Coombe    . ERECTILE DYSFUNCTION, MILD 05/11/2007   Qualifier: Diagnosis of  By: Sherren Mocha MD, Jory Ee   . Exertional dyspnea 10/30/2015  . GOUT 05/11/2007   Qualifier: Diagnosis of  By: Sherren Mocha MD, Jory Ee   . Hereditary and idiopathic peripheral neuropathy 09/14/2014  . History of radiation therapy 02/21/13-03/31/13   rectum 50.4Gy total dose  . Hypersomnia 10/30/2015  . Hypertension   . Lung nodule 10/30/2015  . OAB (overactive bladder) 01/09/2016  . Obesity (BMI 30.0-34.9) 10/30/2015  . Obstructive sleep apnea 07/29/2010   HST 11/2015 AHI 54.  On autocpap  >> 10 cm   . Peripheral edema 09/19/2013  . rectal ca dx'd 02/01/13   rectal. Radiation and chemotherapy- remains on chemotherapy-next tx. 08-22-13.   . S/P partial lobectomy of lung 11/20/2015  . S/P thoracotomy   . SUI (stress urinary  incontinence), male 01/09/2016    Past Surgical History:  Procedure Laterality Date  . A/V FISTULAGRAM Left 06/14/2018   Procedure: A/V FISTULAGRAM;  Surgeon: Waynetta Sandy, MD;  Location: [facility];  Service: Cardiovascular;  Laterality: Left;  . AV FISTULA PLACEMENT Left 03/24/2016   Procedure: CREATION OF LEFT BRACIOCEPHALIC ARTERIOVENOUS (AV) FISTULA;  Surgeon: Elam Dutch, MD;  Location: [facility];  Service: Vascular;  Laterality: Left;  . COLON SURGERY  05/20/2013  . COLONOSCOPY  2010   [facility]  . EUS N/A 02/03/2013   Procedure: LOWER ENDOSCOPIC ULTRASOUND (EUS);  Surgeon: Milus Banister, MD;  Location: [facility];  Service: Endoscopy;  Laterality: N/A;  . ILEOSTOMY CLOSURE N/A 08/24/2013   Procedure: CLOSURE OF LOOP ILEOSTOMY ;  Surgeon: Leighton Ruff, MD;  Location: [facility];  Service: General;  Laterality: N/A;  . IR ANGIO VERTEBRAL SEL SUBCLAVIAN INNOMINATE BILAT MOD SED  06/08/2018  . IR CT HEAD LTD  06/08/2018  . IR INTRAVSC STENT CERV CAROTID W/O EMB-PROT MOD SED INC ANGIO  06/08/2018  . IR PERCUTANEOUS ART THROMBECTOMY/INFUSION INTRACRANIAL INC DIAG ANGIO  06/08/2018  . LAPAROSCOPIC LOW ANTERIOR RESECTION N/A 05/20/2013   Procedure: LAPAROSCOPIC LOW ANTERIOR RESECTION WITH SPLENIC FLEXURE MOBILIZATION;  Surgeon: Leighton Ruff, MD;  Location: [facility];  Service: General;  Laterality: N/A;  . OSTOMY N/A 05/20/2013   Procedure: diverting OSTOMY;  Surgeon: Leighton Ruff, MD;  Location: [facility];  Service: General;  Laterality: N/A;  . PERIPHERAL VASCULAR INTERVENTION Left 06/14/2018   Procedure: PERIPHERAL VASCULAR INTERVENTION;  Surgeon: Waynetta Sandy, MD;  Location: [facility];  Service: Cardiovascular;  Laterality: Left;  AVF on the Left  . RADIOLOGY WITH ANESTHESIA N/A 06/08/2018   Procedure: IR WITH ANESTHESIA;  Surgeon: Radiologist, Medication, MD;  Location: [facility];  Service: Radiology;  Laterality: N/A;  . VIDEO ASSISTED THORACOSCOPY  (VATS)/WEDGE RESECTION Left 11/20/2015   Procedure: VIDEO ASSISTED THORACOSCOPY (VATS)/LUNG RESECTION;  Surgeon: [name], MD;  Location: [facility];  Service: Thoracic;  Laterality: Left;  Marland Kitchen VIDEO BRONCHOSCOPY N/A 11/20/2015   Procedure: VIDEO BRONCHOSCOPY;  Surgeon: [name], MD;  Location[facility];  Service: Thoracic;  Laterality: N/A;    There were no vitals filed for this visit.  Subjective Assessment - 09/06/18 0803    Patient is accompanied by:  Family member   Wife [name])   Pertinent History  Rt MCA CVA 06/08/18. PMH: HTN, HLD, ESRD (Dialysis T/TH/SAT), COPD    Limitations  Fistula LUE    Patient Stated Goals  Get my strength back in my arm    Currently in Pain?  No/denies        [facility] OT Assessment - 09/06/18 0001      Assessment   Medical Diagnosis  Rt CVA    Referring Provider (OT)  Venancio Poisson     Onset Date/Surgical Date  06/08/18    Hand Dominance  Right    Prior Therapy  Inpatient, HH therapies      Precautions   Precautions  Fall    Precaution Comments  Fistula LUE      Balance Screen   Has the patient fallen in the past 6 months  Yes    How many times?  1      Home  Environment   Bathroom Shower/Tub  Tub/Shower Film/video editor    Additional Comments  Pt lives w/ wife in 1 story home, 1 step to enter    Lives With  Spouse      Prior Function   Level of [place]  Retired    Oncologist      ADL   Eating/Feeding  Independent    Grooming  Independent    Development worker, international aid - Civil engineer, contracting  Independent      IADL   Shopping  Needs to be accompanied on any shopping trip    [place]  --   Pt vacuums   Meal Prep  --   wife always cooked  dinner, pt now unable to make breakfast   [place] on family or friends for transportation    Medication Management  --   Wife assists since new meds added   Financial Management  --   wife assists     Mobility   Mobility Status  Independent      Written Expression   Dominant Hand  Right  Vision - History   Baseline Vision  Wears glasses only for reading      Vision Assessment   Comment  Denies change      Cognition   Cognition Comments  pt/family denies change - refer to SLP eval for details on cognition      Observation/Other Assessments   Observations  Dialysis T/TH/SAT, Fistula LUE      Coordination   9 Hole Peg Test  Right;Left    Right 9 Hole Peg Test  31.32 sec    Left 9 Hole Peg Test  31.56 sec      Edema   Edema  none observed      ROM / Strength   AROM / PROM / Strength  AROM      AROM   Overall AROM Comments  BUE AROM WFL's except Lt shoulder end range and bilateral wrists d/t OA, Pt also limited w/ Rt index MP extension (d/t OA)      Hand Function   Right Hand Grip (lbs)  50 lbs    Left Hand Grip (lbs)  30 lbs                           OT Long Term Goals - 09/06/18 0844      OT LONG TERM GOAL #1   Title  Independent w/ HEP for LUE strengthening    Time  4    Period  Weeks    Status  New      OT LONG TERM GOAL #2   Title  Grip strength Lt hand to be 38 lbs or greater to open jars/containers    Baseline  eval = 30    Time  4    Period  Weeks    Status  New      OT LONG TERM GOAL #3   Title  Pt to return to kitchen tasks for snack prep/microwaveable items and sandwich prep for breakfast/lunch    Time  4    Period  Weeks    Status  New      OT LONG TERM GOAL #4   Title  Pt to perform 10 min. of activity w/o rest    Time  4    Period  Weeks    Status  New      OT LONG TERM GOAL #5   Title  Pt to verbalize understanding w/ energy conservation techniques    Time  4    Period  Weeks    Status   New      Long Term Additional Goals   Additional Long Term Goals  Yes      OT LONG TERM GOAL #6   Title  Pt to regain independence of medication management w/ use of pill box and keep track of therapy appointments w/ external aids prn    Time  4    Period  Weeks    Status  New            Plan - 09/06/18 0840    Clinical Impression Statement  Pt is a 73 y.o. male who presents to outpatient rehab s/p Rt MCA infarct 06/08/18. Pt presents w/ Lt hemiparesis and decreased strength and endurance.     OT Occupational Profile and History  Problem Focused Assessment - Including review of records relating to presenting problem    Occupational performance deficits (Please refer to evaluation for details):  ADL's;IADL's  Pt will benefit from skilled therapeutic intervention in order to improve on the following performance deficits  Body Structure / Function / Physical Skills;Cognitive Skills    Body Structure / Function / Physical Skills  ADL;ROM;UE functional use;Mobility;Endurance;Coordination;IADL    Cognitive Skills  Attention;Problem Solve;Safety Awareness;Memory    Rehab Potential  Good    Clinical Decision Making  Limited treatment options, no task modification necessary    Comorbidities Affecting Occupational Performance:  Presence of comorbidities impacting occupational performance    Comorbidities impacting occupational performance description:  ESRD w/ dialysis 3x/week, COPD    Modification or Assistance to Complete Evaluation   Min-Moderate modification of tasks or assist with assess necessary to complete eval    OT Frequency  2x / week    OT Duration  4 weeks   PLUS EVAL   OT Treatment/Interventions  Self-care/ADL training;Therapeutic exercise;Neuromuscular education;Moist Heat;Patient/family education;Energy conservation;Therapist, nutritional;Therapeutic activities;DME and/or AE instruction;Passive range of motion;Manual Therapy;Cognitive remediation/compensation    Plan   HEP for grip strength, high level ROM and low range theraband    Consulted and Agree with Plan of Care  Patient;Family member/caregiver    Family Member Consulted  WIFE       Patient will benefit from skilled therapeutic intervention in order to improve the following deficits and impairments:     Visit Diagnosis: Muscle weakness (generalized) - Plan: Ot plan of care cert/re-cert  Hemiplegia and hemiparesis following cerebral infarction affecting left non-dominant side ([place]) - Plan: Ot plan of care cert/re-cert  Unsteadiness on feet - Plan: Ot plan of care cert/re-cert  Other symptoms and signs involving cognitive functions following cerebral infarction - Plan: Ot plan of care cert/re-cert    Problem List Patient Active Problem List   Diagnosis Date Noted  . Functional diarrhea 07/14/2018  . Thrombocytopenia ([place]) 06/29/2018  . Benign essential HTN   . Hyperglycemia   . Anemia of chronic disease   . ESRD on dialysis ([place]) 06/16/2018  . Mild malnutrition ([place]) 06/16/2018  . Stroke due to embolism ([place]) 06/16/2018  . Acute ischemic stroke ([place]) 06/08/2018  . CHF (congestive heart failure) ([place]) 05/08/2018  . Normocytic anemia 05/08/2018  . Hyperlipidemia LDL goal <70 09/23/2017  . OAB (overactive bladder) 01/09/2016  . SUI (stress urinary incontinence), male 01/09/2016  . Hypersomnia 10/30/2015  . Obesity (BMI 30.0-34.9) 10/30/2015  . Routine general medical examination at a health care facility 06/04/2015  . Medicare annual wellness visit, subsequent 06/04/2015  . Hereditary and idiopathic peripheral neuropathy 09/14/2014  . Rectal cancer ([place]) 02/01/2013  . Obstructive sleep apnea 07/29/2010  . GOUT 05/11/2007  . ERECTILE DYSFUNCTION, MILD 05/11/2007  . Essential hypertension 05/11/2007    Carey Bullocks, OTR/L 09/06/2018, 12:52 PM  [facility] [address] [place], [place], [address] Phone:  [phone]   Fax:  [phone]  Name: ROMIN DIVITA Sr. MRN: [id number] Date of Birth: 1945/09/26

## 2018-09-06 NOTE — Therapy (Signed)
[facility] [address] [place], [place], [address] Phone: [phone]   Fax:  [phone]  Physical Therapy Evaluation  Patient Details  Name: Jason COWIN Sr. MRN: [id number] Date of Birth: [birth year] Referring Provider (PT): Venancio Poisson, NP   Encounter Date: 09/06/2018  PT End of Session - 09/06/18 2105    Visit Number  1    Number of Visits  17    Date for PT Re-Evaluation  11/05/18    Authorization Type  [employer] medicare    PT Start Time  0848    PT Stop Time  0931    PT Time Calculation (min)  43 min    Equipment Utilized During Treatment  Gait belt    Activity Tolerance  Patient tolerated treatment well    Behavior During Therapy  WFL for tasks assessed/performed       Past Medical History:  Diagnosis Date  . Anemia   . Arthritis   . Chronic renal insufficiency 07/31/2011  . COPD (chronic obstructive pulmonary disease) ([place])    pt denies  . CVA (cerebral vascular accident) ([place]) 06/2018  . Deficiency anemia 07/20/2012  . Diverticulosis   . Edema 09/21/2010   Qualifier: Diagnosis of  By: Jerold Coombe    . ERECTILE DYSFUNCTION, MILD 05/11/2007   Qualifier: Diagnosis of  By: Sherren Mocha MD, Jory Ee   . Exertional dyspnea 10/30/2015  . GOUT 05/11/2007   Qualifier: Diagnosis of  By: Sherren Mocha MD, Jory Ee   . Hereditary and idiopathic peripheral neuropathy 09/14/2014  . History of radiation therapy 02/21/13-03/31/13   rectum 50.4Gy total dose  . Hypersomnia 10/30/2015  . Hypertension   . Lung nodule 10/30/2015  . OAB (overactive bladder) 01/09/2016  . Obesity (BMI 30.0-34.9) 10/30/2015  . Obstructive sleep apnea 07/29/2010   HST 11/2015 AHI 54.  On autocpap  >> 10 cm   . Peripheral edema 09/19/2013  . rectal ca dx'd 02/01/13   rectal. Radiation and chemotherapy- remains on chemotherapy-next tx. 08-22-13.   . S/P partial lobectomy of lung 11/20/2015  . S/P thoracotomy   . SUI (stress urinary incontinence), male  01/09/2016    Past Surgical History:  Procedure Laterality Date  . A/V FISTULAGRAM Left 06/14/2018   Procedure: A/V FISTULAGRAM;  Surgeon: Waynetta Sandy, MD;  Location: [facility];  Service: Cardiovascular;  Laterality: Left;  . AV FISTULA PLACEMENT Left 03/24/2016   Procedure: CREATION OF LEFT BRACIOCEPHALIC ARTERIOVENOUS (AV) FISTULA;  Surgeon: Elam Dutch, MD;  Location: [facility];  Service: Vascular;  Laterality: Left;  . COLON SURGERY  05/20/2013  . COLONOSCOPY  2010   [facility]  . EUS N/A 02/03/2013   Procedure: LOWER ENDOSCOPIC ULTRASOUND (EUS);  Surgeon: Milus Banister, MD;  Location: [facility];  Service: Endoscopy;  Laterality: N/A;  . ILEOSTOMY CLOSURE N/A 08/24/2013   Procedure: CLOSURE OF LOOP ILEOSTOMY ;  Surgeon: Leighton Ruff, MD;  Location: [facility];  Service: General;  Laterality: N/A;  . IR ANGIO VERTEBRAL SEL SUBCLAVIAN INNOMINATE BILAT MOD SED  06/08/2018  . IR CT HEAD LTD  06/08/2018  . IR INTRAVSC STENT CERV CAROTID W/O EMB-PROT MOD SED INC ANGIO  06/08/2018  . IR PERCUTANEOUS ART THROMBECTOMY/INFUSION INTRACRANIAL INC DIAG ANGIO  06/08/2018  . LAPAROSCOPIC LOW ANTERIOR RESECTION N/A 05/20/2013   Procedure: LAPAROSCOPIC LOW ANTERIOR RESECTION WITH SPLENIC FLEXURE MOBILIZATION;  Surgeon: Leighton Ruff, MD;  Location: [facility];  Service: General;  Laterality: N/A;  . OSTOMY N/A  05/20/2013   Procedure: diverting OSTOMY;  Surgeon: Leighton Ruff, MD;  Location: [facility];  Service: General;  Laterality: N/A;  . PERIPHERAL VASCULAR INTERVENTION Left 06/14/2018   Procedure: PERIPHERAL VASCULAR INTERVENTION;  Surgeon: Waynetta Sandy, MD;  Location: [facility];  Service: Cardiovascular;  Laterality: Left;  AVF on the Left  . RADIOLOGY WITH ANESTHESIA N/A 06/08/2018   Procedure: IR WITH ANESTHESIA;  Surgeon: Radiologist, Medication, MD;  Location: [facility];  Service: Radiology;  Laterality: N/A;  . VIDEO ASSISTED THORACOSCOPY (VATS)/WEDGE RESECTION Left  11/20/2015   Procedure: VIDEO ASSISTED THORACOSCOPY (VATS)/LUNG RESECTION;  Surgeon: [name], MD;  Location: [facility];  Service: Thoracic;  Laterality: Left;  Marland Kitchen VIDEO BRONCHOSCOPY N/A 11/20/2015   Procedure: VIDEO BRONCHOSCOPY;  Surgeon: [name], MD;  Location: [facility];  Service: Thoracic;  Laterality: N/A;    There were no vitals filed for this visit.   Subjective Assessment - 09/06/18 0850    Subjective  The patient is s/p R MCA CVA on 06/07/2018.  He has undergone IP rehab and [facility] PT and now presents to outpatient physical therapy.  CC:  Unsteadiness, L sided weakness, dizziness (when at dialysis) and 1 fall "everything went black".   He does not use a device in the home or coming into PT today, but reports use of a RW for church and going out to restaurants.      Pertinent History  ESRD with dialysis T, Th, Sat (since December), COPD, HTN, anemia, OSA, h/o rectal cancer.      Patient Stated Goals  L side stronger.    Currently in Pain?  No/denies         [facility] PT Assessment - 09/06/18 0854      Assessment   Medical Diagnosis  R MCA stroke    Referring Provider (PT)  Venancio Poisson, NP    Onset Date/Surgical Date  06/07/18    Hand Dominance  Right    Prior Therapy  inpatient and [facility] PT      Precautions   Precautions  Fall    Precaution Comments  fistula L UE      Balance Screen   Has the patient fallen in the past 6 months  Yes    How many times?  1    Has the patient had a decrease in activity level because of a fear of falling?   Yes    Is the patient reluctant to leave their home because of a fear of falling?   No      Home Environment   Living Environment  Private residence    Living Arrangements  Spouse/significant other    Type of [place] to enter    Entrance Stairs-Number of Steps  1    [place]  One level    [place] - 2 wheels;Shower seat      Prior Function   Level of  Independence  Independent    Vocation  Retired    Hospital doctor   Overall Cognitive Status  Within Functional Limits for tasks assessed      Sensation   Light Touch  Appears Intact      ROM / Strength   AROM / PROM / Strength  AROM;Strength      AROM   Overall AROM   Within functional limits for  tasks performed      Strength   Overall Strength  Deficits    Overall Strength Comments  R UE is 4/5 for shoulder flexion, 5/5 for shoulder abduction and 5/5 for elbow flexion/extension.  L shoulder flexion is 4-/5, shoulder abduction is 4-/5, L elbow flexion 4/5, and L elbow extension is 4/5.    Strength Assessment Site  Hip;Knee;Ankle    Right/Left Hip  Right;Left    Right Hip Flexion  4/5    Right Hip ABduction  3+/5    Left Hip Flexion  3+/5    Left Hip ABduction  3-/5    Right/Left Knee  Right;Left    Right Knee Flexion  4/5    Right Knee Extension  4/5    Left Knee Flexion  3+/5    Left Knee Extension  3+/5    Right/Left Ankle  Right;Left    Right Ankle Dorsiflexion  4-/5    Right Ankle Plantar Flexion  --   can do 3 reps of bilat heel raise and then fatigues   Left Ankle Dorsiflexion  3/5      Ambulation/Gait   Ambulation/Gait  Yes    Ambulation/Gait Assistance  5: Supervision    Ambulation/Gait Assistance Details  patient has a couple of instances of dec'd left foot clearance; he self recovers from instability    Ambulation Distance (Feet)  150 Feet    Assistive device  None    Gait Pattern  Decreased dorsiflexion - left;Decreased weight shift to left;Left foot flat;Poor foot clearance - left    Ambulation Surface  Level;Indoor    Gait velocity  2.49 ft/sec    Stairs  Yes    Stairs Assistance  5: Supervision    Stair Management Technique  Two rails;Step to pattern    Number of Stairs  4      Standardized Balance Assessment   Standardized Balance Assessment  Berg Balance Test;Timed Up and Go Test      Berg Balance Test   Sit to Stand  Able to  stand  independently using hands    Standing Unsupported  Able to stand safely 2 minutes    Sitting with Back Unsupported but Feet Supported on Floor or Stool  Able to sit safely and securely 2 minutes    Stand to Sit  Sits safely with minimal use of hands    Transfers  Able to transfer safely, definite need of hands    Standing Unsupported with Eyes Closed  Able to stand 10 seconds with supervision    Standing Unsupported with Feet Together  Able to place feet together independently and stand for 1 minute with supervision    From Standing, Reach Forward with Outstretched Arm  Can reach forward >12 cm safely (5")    From Standing Position, Pick up Object from Floor  Able to pick up shoe, needs supervision    From Standing Position, Turn to Look Behind Over each Shoulder  Turn sideways only but maintains balance    Turn 360 Degrees  Needs close supervision or verbal cueing    Standing Unsupported, Alternately Place Feet on Step/Stool  Able to stand independently and safely and complete 8 steps in 20 seconds    Standing Unsupported, One Foot in Front  Able to take small step independently and hold 30 seconds    Standing on One Leg  Unable to try or needs assist to prevent fall    Total Score  39    Berg comment:  39/56 indicating high risk for falls      Timed Up and Go Test   TUG  --   Without device, 12.16 seconds               Objective measurements completed on examination: See above findings.      [facility] Adult PT Treatment/Exercise - 09/06/18 0854      Exercises   Exercises  Other Exercises    Other Exercises   See patient instructions for LE strengthening HEP.             PT Education - 09/06/18 2104    Education Details  HEP established    Person(s) Educated  Patient    Methods  Explanation;Demonstration;Handout    Comprehension  Verbalized understanding;Returned demonstration       PT Short Term Goals - 09/06/18 2109      PT SHORT TERM GOAL #1   Title   The patient will be indep with HEP for LE strengthening, balance and general mobility.    Time  4    Period  Weeks    Target Date  10/06/18      PT SHORT TERM GOAL #2   Title  The patient will improve Berg score from 39/56 to > or equal to 44/56 to demo dec'ing risk for falls.    Time  4    Period  Weeks    Target Date  10/06/18      PT SHORT TERM GOAL #3   Title  The patient will improve gait speed from 2.49 ft/sec to > or equal to 2.9 ft/sec to demo improving functional mobility.    Time  4    Period  Weeks    Target Date  10/06/18      PT SHORT TERM GOAL #4   Title  The patient will move floor<>stand with UE support.    Time  4    Period  Weeks    Target Date  10/06/18           PT Long Term Goals - 09/07/18 2144      PT LONG TERM GOAL #1   Title  The patient will be indep with progression of HEP for post d/c exercise.    Time  8    Period  Weeks    Target Date  11/05/18      PT LONG TERM GOAL #2   Title  The patient will improve gait speed from 2.49 ft/sec to > or equal to 3.2 ft/sec demonstrating increased community ambulation.    Time  8    Period  Weeks    Target Date  11/05/18      PT LONG TERM GOAL #3   Title  The patient will improve Berg score from 39/56 to > or equal to 49/56 to demo dec'd risk for falls.    Time  8    Period  Weeks    Target Date  11/05/18      PT LONG TERM GOAL #4   Title  The patient will negotiate 4 steps with one handrail and a reciprocal pattern mod indep.    Time  8    Period  Weeks    Target Date  11/05/18      PT LONG TERM GOAL #5   Title  The patient will negotiate community surfaces mod indep x 1092ft. without evidence of L foot catching to demo increasing L LE strength.    Time  8    Period  Weeks    Target Date  11/05/18      Plan - 09/07/18 2153    Clinical Impression Statement  The patient is a 73 year old male presenting s/p CVA on 06/08/2019.  He presents to OP physical therapy with L sided weakness,  imbalance, dec'd gait speed, decreased endurance and overall decline in mobility since the stroke.      Personal Factors and Comorbidities  Comorbidity 1;Comorbidity 2;Comorbidity 3+    Comorbidities  end stage renal disease, HTN, COPT, OSA, h/o rectal cancer    Examination-Activity Limitations  Stand;Stairs;Locomotion Level    Examination-Participation Restrictions  Community Activity;Driving    Stability/Clinical Decision Making  Unstable/Unpredictable    Clinical Decision Making  High   due to diabetes, ESRD, and dialysis   Rehab Potential  Good    PT Frequency  2x / week   + eval   PT Duration  8 weeks    PT Treatment/Interventions  ADLs/Self Care Home Management;Therapeutic activities;Therapeutic exercise;Balance training;Neuromuscular re-education;Gait training;Stair training;Functional mobility training;Patient/family education;Electrical Stimulation;Manual techniques    PT Next Visit Plan  check HEP, add further L LE strengthening; work on standing balance, dynamic gait and L motor control.  Consider endurance and limited ability to perform HEP due to dialysis schedule (discuss ways to move more in day to day activities). * Plan to check on bioness use in dialysis patients to determine if ay precuations-- would be beneficial for L LE strengthening.    Consulted and Agree with Plan of Care  Patient;Family member/caregiver    Family Member Consulted  spouse               Patient will benefit from skilled therapeutic intervention in order to improve the following deficits and impairments:     Visit Diagnosis: Other abnormalities of gait and mobility  Unsteadiness on feet  Muscle weakness (generalized)     Problem List Patient Active Problem List   Diagnosis Date Noted  . Functional diarrhea 07/14/2018  . Thrombocytopenia ([place]) 06/29/2018  . Benign essential HTN   . Hyperglycemia   . Anemia of chronic disease   . ESRD on dialysis ([place]) 06/16/2018  . Mild  malnutrition ([place]) 06/16/2018  . Stroke due to embolism ([place]) 06/16/2018  . Acute ischemic stroke ([place]) 06/08/2018  . CHF (congestive heart failure) ([place]) 05/08/2018  . Normocytic anemia 05/08/2018  . Hyperlipidemia LDL goal <70 09/23/2017  . OAB (overactive bladder) 01/09/2016  . SUI (stress urinary incontinence), male 01/09/2016  . Hypersomnia 10/30/2015  . Obesity (BMI 30.0-34.9) 10/30/2015  . Routine general medical examination at a health care facility 06/04/2015  . Medicare annual wellness visit, subsequent 06/04/2015  . Hereditary and idiopathic peripheral neuropathy 09/14/2014  . Rectal cancer ([place]) 02/01/2013  . Obstructive sleep apnea 07/29/2010  . GOUT 05/11/2007  . ERECTILE DYSFUNCTION, MILD 05/11/2007  . Essential hypertension 05/11/2007    [place], PT 09/06/2018, 9:42 PM  [facility] [address] [place], [place], [address] Phone: [phone]   Fax:  [phone]  Name: Jason RAWL Sr. MRN: [id number] Date of Birth: 04-09-1946

## 2018-09-06 NOTE — Patient Instructions (Signed)
Access Code: [email or web address]  URL: [email or web address]  Date: 09/06/2018  Prepared by: Rudell Cobb   Exercises Sit to Stand - 10 reps - 2 sets - 1x daily - 7x weekly Heel rises with counter support - 20 reps - 2 sets - 1x daily - 7x weekly Seated Toe Raise - 10 reps - 2 sets - 2-3x daily - 7x weekly

## 2018-09-06 NOTE — Therapy (Signed)
[facility] [address] [place], [place], [address] Phone: [phone]   Fax:  [phone]  Speech Language Pathology Evaluation  Patient Details  Name: Jason BOYAR Sr. MRN: [id number] Date of Birth: 09/04/1945 Referring Provider (SLP): Venancio Poisson, NP   Encounter Date: 09/06/2018  End of Session - 09/06/18 1758    Visit Number  1    Number of Visits  17    Date for SLP Re-Evaluation  11/05/18    Authorization Type  [employer] Medicare, $40 ST/PT one copay, OT separate    SLP Start Time  1017    SLP Stop Time   1100    SLP Time Calculation (min)  43 min    Activity Tolerance  Patient tolerated treatment well       Past Medical History:  Diagnosis Date  . Anemia   . Arthritis   . Chronic renal insufficiency 07/31/2011  . COPD (chronic obstructive pulmonary disease) ([place])    pt denies  . CVA (cerebral vascular accident) ([place]) 06/2018  . Deficiency anemia 07/20/2012  . Diverticulosis   . Edema 09/21/2010   Qualifier: Diagnosis of  By: Jerold Coombe    . ERECTILE DYSFUNCTION, MILD 05/11/2007   Qualifier: Diagnosis of  By: Sherren Mocha MD, Jory Ee   . Exertional dyspnea 10/30/2015  . GOUT 05/11/2007   Qualifier: Diagnosis of  By: Sherren Mocha MD, Jory Ee   . Hereditary and idiopathic peripheral neuropathy 09/14/2014  . History of radiation therapy 02/21/13-03/31/13   rectum 50.4Gy total dose  . Hypersomnia 10/30/2015  . Hypertension   . Lung nodule 10/30/2015  . OAB (overactive bladder) 01/09/2016  . Obesity (BMI 30.0-34.9) 10/30/2015  . Obstructive sleep apnea 07/29/2010   HST 11/2015 AHI 54.  On autocpap  >> 10 cm   . Peripheral edema 09/19/2013  . rectal ca dx'd 02/01/13   rectal. Radiation and chemotherapy- remains on chemotherapy-next tx. 08-22-13.   . S/P partial lobectomy of lung 11/20/2015  . S/P thoracotomy   . SUI (stress urinary incontinence), male 01/09/2016    Past Surgical History:  Procedure Laterality Date  .  A/V FISTULAGRAM Left 06/14/2018   Procedure: A/V FISTULAGRAM;  Surgeon: Waynetta Sandy, MD;  Location: [facility];  Service: Cardiovascular;  Laterality: Left;  . AV FISTULA PLACEMENT Left 03/24/2016   Procedure: CREATION OF LEFT BRACIOCEPHALIC ARTERIOVENOUS (AV) FISTULA;  Surgeon: Elam Dutch, MD;  Location: [facility];  Service: Vascular;  Laterality: Left;  . COLON SURGERY  05/20/2013  . COLONOSCOPY  2010   [facility]  . EUS N/A 02/03/2013   Procedure: LOWER ENDOSCOPIC ULTRASOUND (EUS);  Surgeon: Milus Banister, MD;  Location: [facility];  Service: Endoscopy;  Laterality: N/A;  . ILEOSTOMY CLOSURE N/A 08/24/2013   Procedure: CLOSURE OF LOOP ILEOSTOMY ;  Surgeon: Leighton Ruff, MD;  Location: [facility];  Service: General;  Laterality: N/A;  . IR ANGIO VERTEBRAL SEL SUBCLAVIAN INNOMINATE BILAT MOD SED  06/08/2018  . IR CT HEAD LTD  06/08/2018  . IR INTRAVSC STENT CERV CAROTID W/O EMB-PROT MOD SED INC ANGIO  06/08/2018  . IR PERCUTANEOUS ART THROMBECTOMY/INFUSION INTRACRANIAL INC DIAG ANGIO  06/08/2018  . LAPAROSCOPIC LOW ANTERIOR RESECTION N/A 05/20/2013   Procedure: LAPAROSCOPIC LOW ANTERIOR RESECTION WITH SPLENIC FLEXURE MOBILIZATION;  Surgeon: Leighton Ruff, MD;  Location: [facility];  Service: General;  Laterality: N/A;  . OSTOMY N/A 05/20/2013   Procedure: diverting OSTOMY;  Surgeon: Leighton Ruff, MD;  Location: [facility];  Service: General;  Laterality: N/A;  . PERIPHERAL VASCULAR INTERVENTION Left 06/14/2018   Procedure: PERIPHERAL VASCULAR INTERVENTION;  Surgeon: Waynetta Sandy, MD;  Location: [facility];  Service: Cardiovascular;  Laterality: Left;  AVF on the Left  . RADIOLOGY WITH ANESTHESIA N/A 06/08/2018   Procedure: IR WITH ANESTHESIA;  Surgeon: Radiologist, Medication, MD;  Location: [facility];  Service: Radiology;  Laterality: N/A;  . VIDEO ASSISTED THORACOSCOPY (VATS)/WEDGE RESECTION Left 11/20/2015   Procedure: VIDEO ASSISTED THORACOSCOPY (VATS)/LUNG  RESECTION;  Surgeon: [name], MD;  Location: [facility];  Service: Thoracic;  Laterality: Left;  Marland Kitchen VIDEO BRONCHOSCOPY N/A 11/20/2015   Procedure: VIDEO BRONCHOSCOPY;  Surgeon: [name], MD;  Location: [facility];  Service: Thoracic;  Laterality: N/A;    There were no vitals filed for this visit.  Subjective Assessment - 09/06/18 1734    Subjective  Pt unintelligible when confirming name, birthdate (primarily due to low vocal intensity)    Patient is accompained by:  Family member   wife   Currently in Pain?  No/denies         SLP Evaluation [facility] - 09/06/18 1734      SLP Visit Information   SLP Received On  09/06/18    Referring Provider (SLP)  Venancio Poisson, NP    Onset Date  06/08/18    Medical Diagnosis  CVA      General Information   HPI  Jason ALEWINE Sr. is a 73 y.o. year old male who had right MCA infarct on 06/08/2018 secondary to right ICA and MCA occlusion, status post IR and rescue stents with TICI 2B reperfusion likely due to large vessel arthrosclerosis but unable to rule out cardioembolic source. Vascular risk factors include HTN, HLD, OSA on CPAP, ESRD, COPD, obesity and former tobacco/alcohol use. MRI/MRA brain showed right MCA infarct greatest involvement in basal ganglia. Had ST, OT, PT in [place] d/c 06/29/18, then [facility]. Recent ED visit 07/28/18 for rectal bleeding, colonoscopy/endoscopy pending with GI. ST addressed dysarthria and cognitive deficits including attention, problem solving, memory and awareness. Oral dysphagia initially but advanced to regular/thin prior to d/c from acute. NP noted dysarthria, reading difficulties at most recent f/u.       Balance Screen   Has the patient fallen in the past 6 months  Yes   see PT eval   How many times?  1      Prior Functional Status   Cognitive/Linguistic Baseline  Within functional limits    Type of Home  House     Lives With  Spouse    Vocation  Retired      Associate Professor   Overall Cognitive Status   Impaired/Different from baseline   not formally assessed today; will assess in upcoming session     Auditory Comprehension   Overall Auditory Comprehension  Appears within functional limits for tasks assessed    Yes/No Questions  Within Functional Limits    Commands  Within Functional Limits    Conversation  Simple      Visual Recognition/Discrimination   Discrimination  Within Function Limits      Reading Comprehension   Reading Status  Impaired    Word level  51-75% accurate   65%, visual and semantic confusions   Sentence Level  26-50% accurate   comprehension : 42% sentence level   Paragraph Level  Not tested    Functional Environmental (signs, name badge)  Impaired   Funct reading RCBA 6/10 (above  word level impaired)   Effective Techniques  Verbal cueing      Expression   Primary Mode of Expression  Verbal      Verbal Expression   Overall Verbal Expression  Appears within functional limits for tasks assessed    Initiation  No impairment    Automatic Speech  Name;Social Response    Level of Generative/Spontaneous Verbalization  Conversation    Repetition  No impairment    Naming  Not tested    Pragmatics  No impairment      Written Expression   Dominant Hand  Right    Written Expression  Not tested      Oral Motor/Sensory Function   Overall Oral Motor/Sensory Function  Impaired    Labial ROM  Reduced left    Labial Symmetry  Abnormal symmetry left    Labial Strength  Within Functional Limits    Labial Sensation  Within Functional Limits    Lingual ROM  Within Functional Limits    Lingual Symmetry  Within Functional Limits    Lingual Strength  Reduced Left    Lingual Sensation  Within Functional Limits    Facial ROM  Reduced left    Facial Symmetry  Left droop    Facial Strength  Within Functional Limits    Facial Sensation  Within Functional Limits    Velum  Within Functional Limits    Mandible  Within Functional Limits      Motor Speech   Overall Motor  Speech  Impaired    Respiration  Impaired    Level of Impairment  Sentence    Phonation  Hoarse;Low vocal intensity    Resonance  Within functional limits    Articulation  Impaired    Level of Impairment  Phrase    Intelligibility  Intelligibility reduced    Word  75-100% accurate    Phrase  75-100% accurate    Sentence  75-100% accurate    Conversation  75-100% accurate   75%   Motor Planning  Witnin functional limits    Motor Speech Errors  Not applicable    Interfering Components  Inadequate dentition   not wearing dentures   Effective Techniques  Increased vocal intensity;Over-articulate    Phonation  Impaired    Volume  Decibel Level;Soft   range 56-63 dB at 30 cm spontaneous speech   Pitch  Appropriate      Standardized Assessments   Standardized Assessments   Other Assessment   portions of RCBA, [place] Diagnostic Aphasia Examination   Other Assessment  Vocal intensity reduced at average 59.4 dB in spontaneous speech task ([employer]ft). Vocal quality hoarse, rough. Maximum phonation time 12.4 seconds. Reading at word level 65% accurate, with phonemic and semantic errors noted: (dib/bib, cap + map/nap, water/weather, eight/eleventh, five/fifteen, leader/ladder). Reading comprehension at sentence level 42% (0% for complex sentences).                       SLP Education - 09/06/18 1757    Education Details  deficits, proposed therapy goals    Person(s) Educated  Patient;Spouse    Methods  Explanation    Comprehension  Verbalized understanding;Need further instruction       SLP Short Term Goals - 09/06/18 1807      SLP SHORT TERM GOAL #1   Title  Pt will complete HEP for dysarthria with occasional min A x 3 visits.    Time  4    Period  Weeks    Status  New      SLP SHORT TERM GOAL #2   Title  Pt will complete cognitive linquistic testing by session 5, if necessary.    Time  2    Period  Weeks    Status  New      SLP SHORT TERM GOAL #3   Title   Pt will read personally relevant words (family names, sports, fishing terminology) 85% accuracy with occasional min A.      SLP SHORT TERM GOAL #4   Title  Pt will generate phrase or short sentences responses 18/20 >70dB x 3 sessions.     Time  4    Period  Weeks    Status  New       SLP Long Term Goals - 09/06/18 1817      SLP LONG TERM GOAL #1   Title  Pt will demo HEP for dysarthria with modified independence x 3 visits.    Time  8    Period  Weeks    Status  New      SLP LONG TERM GOAL #2   Title  Pt will report fewer requests from wife to repeat himself.    Time  8    Period  Weeks    Status  New      SLP LONG TERM GOAL #3   Title  Pt will read brief, simple paragraphs (sports, [employer]) with comprehension >80% x 3 sessions, compensations allowed.     Time  8    Period  Weeks    Status  New      SLP LONG TERM GOAL #4   Title  Pt will maintain average >68dB in 5 minutes simple conversation x 3 visits.    Time  8    Period  Weeks    Status  New       Plan - 09/06/18 1759    Clinical Impression Statement  Jason [name] presents with moderate dysarthria, cognitive deficits, and dyslexia. He has mild left facial weakness and minimal lingual weakness. Pt/wife deny difficulties swallowing. Low intensity voice has most significant impact on intelligibility (56-64 dB range at 30 cm, well below norm of 70-72dB). Reading impaired at the word level; pt reports prior to CVA he enjoyed reading sports/fishing magazines but has been unable to do so. Cognition not formally assessed today as pt/wife most concerned about reading and intelligibility. Verbal expression and auditory comprehension appear WNL for tasks assessed today. Cognitive deficits noted on CIR; SLP to assess by visit 5 if appropriate and add goals PRN. Wife reports she asks pt to repeat himself frequently; pt agrees this is frustrating. I recommend skilled ST to address intelligibility, reading comprehension, and  cognition in order to improve intelligibility and quality of life.    Speech Therapy Frequency  2x / week    Duration  --   8 weeks or 17 visits   Treatment/Interventions  SLP instruction and feedback;Environmental controls;Functional tasks;Cognitive reorganization;Compensatory strategies;Internal/external aids;Patient/family education;Multimodal communcation approach    Potential to Achieve Goals  Good    Potential Considerations  Ability to learn/carryover information    Consulted and Agree with Plan of Care  Patient;Family member/caregiver    Family Member Consulted  wife       Patient will benefit from skilled therapeutic intervention in order to improve the following deficits and impairments:   Dysarthria and anarthria  Dyslexia and alexia  Cognitive communication deficit    Problem  List Patient Active Problem List   Diagnosis Date Noted  . Functional diarrhea 07/14/2018  . Thrombocytopenia ([place]) 06/29/2018  . Benign essential HTN   . Hyperglycemia   . Anemia of chronic disease   . ESRD on dialysis ([place]) 06/16/2018  . Mild malnutrition ([place]) 06/16/2018  . Stroke due to embolism ([place]) 06/16/2018  . Acute ischemic stroke ([place]) 06/08/2018  . CHF (congestive heart failure) ([place]) 05/08/2018  . Normocytic anemia 05/08/2018  . Hyperlipidemia LDL goal <70 09/23/2017  . OAB (overactive bladder) 01/09/2016  . SUI (stress urinary incontinence), male 01/09/2016  . Hypersomnia 10/30/2015  . Obesity (BMI 30.0-34.9) 10/30/2015  . Routine general medical examination at a health care facility 06/04/2015  . Medicare annual wellness visit, subsequent 06/04/2015  . Hereditary and idiopathic peripheral neuropathy 09/14/2014  . Rectal cancer ([place]) 02/01/2013  . Obstructive sleep apnea 07/29/2010  . GOUT 05/11/2007  . ERECTILE DYSFUNCTION, MILD 05/11/2007  . Essential hypertension 05/11/2007   Deneise Lever, [place], [place] 09/06/2018, 6:19  PM  [facility] [address] [place], [place], [address] Phone: [phone]   Fax:  [phone]  Name: Jason PANGBORN Sr. MRN: [id number] Date of Birth: Apr 15, 1946

## 2018-09-06 NOTE — Telephone Encounter (Signed)
Explained to patient and patient's wife Dr. Clydene Fake and Dr. Corena Pilgrim recommendations. After speaking with patient and patient's wife, pt states he doing a little better and having around 4 semi-formed stools a day. Not significantly better according to patient. Informed patient that Dr. Corena Pilgrim recommends continuing with the plan of EGD/Colonoscopy while on Brilinta and aspirin. Informed him that he is at a higher risk of bleeding if biopsies are taken. Patient and patient's wife verbalized understanding.

## 2018-09-07 DIAGNOSIS — N2581 Secondary hyperparathyroidism of renal origin: Secondary | ICD-10-CM | POA: Diagnosis not present

## 2018-09-07 DIAGNOSIS — D689 Coagulation defect, unspecified: Secondary | ICD-10-CM | POA: Diagnosis not present

## 2018-09-07 DIAGNOSIS — N186 End stage renal disease: Secondary | ICD-10-CM | POA: Diagnosis not present

## 2018-09-07 DIAGNOSIS — R197 Diarrhea, unspecified: Secondary | ICD-10-CM | POA: Diagnosis not present

## 2018-09-07 DIAGNOSIS — Z23 Encounter for immunization: Secondary | ICD-10-CM | POA: Diagnosis not present

## 2018-09-07 NOTE — Telephone Encounter (Signed)
Understood and agreed.

## 2018-09-07 NOTE — Telephone Encounter (Signed)
Per taking to [facility]. pt does want to proceed with scheduled with procedures as scheduled.

## 2018-09-09 ENCOUNTER — Other Ambulatory Visit: Payer: Self-pay | Admitting: Gastroenterology

## 2018-09-09 DIAGNOSIS — N2581 Secondary hyperparathyroidism of renal origin: Secondary | ICD-10-CM | POA: Diagnosis not present

## 2018-09-09 DIAGNOSIS — R197 Diarrhea, unspecified: Secondary | ICD-10-CM | POA: Diagnosis not present

## 2018-09-09 DIAGNOSIS — N186 End stage renal disease: Secondary | ICD-10-CM | POA: Diagnosis not present

## 2018-09-09 DIAGNOSIS — D689 Coagulation defect, unspecified: Secondary | ICD-10-CM | POA: Diagnosis not present

## 2018-09-09 DIAGNOSIS — Z23 Encounter for immunization: Secondary | ICD-10-CM | POA: Diagnosis not present

## 2018-09-11 DIAGNOSIS — D689 Coagulation defect, unspecified: Secondary | ICD-10-CM | POA: Diagnosis not present

## 2018-09-11 DIAGNOSIS — Z23 Encounter for immunization: Secondary | ICD-10-CM | POA: Diagnosis not present

## 2018-09-11 DIAGNOSIS — R197 Diarrhea, unspecified: Secondary | ICD-10-CM | POA: Diagnosis not present

## 2018-09-11 DIAGNOSIS — N186 End stage renal disease: Secondary | ICD-10-CM | POA: Diagnosis not present

## 2018-09-11 DIAGNOSIS — N2581 Secondary hyperparathyroidism of renal origin: Secondary | ICD-10-CM | POA: Diagnosis not present

## 2018-09-13 ENCOUNTER — Telehealth: Payer: Self-pay | Admitting: Gastroenterology

## 2018-09-13 NOTE — Telephone Encounter (Signed)
Pls call pt, he has questions regarding his medications, he is scheduled for a procedure on 3/13 at [facility].

## 2018-09-13 NOTE — Telephone Encounter (Signed)
Pt and pts wife calling regarding holding blood thinner for procedure. Also asking about holding medications that pt takes for diarrhea.

## 2018-09-13 NOTE — Telephone Encounter (Signed)
Spoke to patients wife and advised on previous recommendations for the Brilinta. She states understanding to not stop the blood thinner. She also asked about the fiber in regards to holding it before the colonoscopy. Advised to hold 5 days prior.

## 2018-09-13 NOTE — Telephone Encounter (Signed)
See the 09-03-2018 phone note.

## 2018-09-14 DIAGNOSIS — D689 Coagulation defect, unspecified: Secondary | ICD-10-CM | POA: Diagnosis not present

## 2018-09-14 DIAGNOSIS — N186 End stage renal disease: Secondary | ICD-10-CM | POA: Diagnosis not present

## 2018-09-14 DIAGNOSIS — R197 Diarrhea, unspecified: Secondary | ICD-10-CM | POA: Diagnosis not present

## 2018-09-14 DIAGNOSIS — Z23 Encounter for immunization: Secondary | ICD-10-CM | POA: Diagnosis not present

## 2018-09-14 DIAGNOSIS — N2581 Secondary hyperparathyroidism of renal origin: Secondary | ICD-10-CM | POA: Diagnosis not present

## 2018-09-16 ENCOUNTER — Ambulatory Visit: Payer: Medicare Other | Admitting: Internal Medicine

## 2018-09-16 ENCOUNTER — Ambulatory Visit: Payer: Medicare Other | Admitting: Occupational Therapy

## 2018-09-16 DIAGNOSIS — R197 Diarrhea, unspecified: Secondary | ICD-10-CM | POA: Diagnosis not present

## 2018-09-16 DIAGNOSIS — Z23 Encounter for immunization: Secondary | ICD-10-CM | POA: Diagnosis not present

## 2018-09-16 DIAGNOSIS — D689 Coagulation defect, unspecified: Secondary | ICD-10-CM | POA: Diagnosis not present

## 2018-09-16 DIAGNOSIS — N186 End stage renal disease: Secondary | ICD-10-CM | POA: Diagnosis not present

## 2018-09-16 DIAGNOSIS — N2581 Secondary hyperparathyroidism of renal origin: Secondary | ICD-10-CM | POA: Diagnosis not present

## 2018-09-16 NOTE — Anesthesia Preprocedure Evaluation (Addendum)
Anesthesia Evaluation   Patient confused    Reviewed: Allergy & Precautions, NPO status , Patient's Chart, lab work & pertinent test results, Unable to perform ROS - Chart review only  History of Anesthesia Complications Negative for: history of anesthetic complications  Airway Mallampati: II  TM Distance: >3 FB Neck ROM: Full    Dental no notable dental hx.    Pulmonary sleep apnea , COPD,  COPD inhaler, former smoker,    Pulmonary exam normal breath sounds clear to auscultation       Cardiovascular hypertension, Pt. on medications +CHF  Normal cardiovascular exam Rhythm:Regular Rate:Normal   '19 TTE - Mild concentric LVH, severe global hypokinesis, EF 16%, grade 2 diastolic dysfx. Moderately dilated LA, mildly dilated RA. Mild MR. Mod-severe TR. Severe pulmonary hypertension, PASP 9mHg    Neuro/Psych CVA, Residual Symptoms    GI/Hepatic   Endo/Other    Renal/GU ESRFRenal disease ESRD with recently placed dialysis catheter, has not yet started dialysis      Musculoskeletal  (+) Arthritis , Osteoarthritis,    Abdominal   Peds  Hematology  (+) Blood dyscrasia, anemia ,   Anesthesia Other Findings   Reproductive/Obstetrics                           Anesthesia Physical  Anesthesia Plan  ASA: IV  Anesthesia Plan: MAC   Post-op Pain Management:    Induction: Intravenous  PONV Risk Score and Plan: 2 and Treatment may vary due to age or medical condition  Airway Management Planned: Nasal Cannula and Simple Face Mask  Additional Equipment:   Intra-op Plan:   Post-operative Plan:   Informed Consent:   Plan Discussed with: CRNA, Anesthesiologist and Surgeon  Anesthesia Plan Comments:       Anesthesia Quick Evaluation

## 2018-09-17 ENCOUNTER — Other Ambulatory Visit: Payer: Self-pay

## 2018-09-17 ENCOUNTER — Ambulatory Visit (HOSPITAL_COMMUNITY)
Admission: RE | Admit: 2018-09-17 | Discharge: 2018-09-17 | Disposition: A | Discharge status: Home / Self Care | Payer: Medicare Other | Attending: Gastroenterology | Admitting: Gastroenterology

## 2018-09-17 ENCOUNTER — Encounter (HOSPITAL_COMMUNITY): Payer: Self-pay | Admitting: Gastroenterology

## 2018-09-17 ENCOUNTER — Ambulatory Visit (HOSPITAL_COMMUNITY): Payer: Medicare Other | Admitting: Anesthesiology

## 2018-09-17 ENCOUNTER — Encounter (HOSPITAL_COMMUNITY)
Admission: RE | Disposition: A | Discharge status: Home / Self Care | Payer: Self-pay | Source: Home / Self Care | Attending: Gastroenterology

## 2018-09-17 ENCOUNTER — Telehealth: Payer: Self-pay

## 2018-09-17 DIAGNOSIS — D631 Anemia in chronic kidney disease: Secondary | ICD-10-CM | POA: Insufficient documentation

## 2018-09-17 DIAGNOSIS — I130 Hypertensive heart and chronic kidney disease with heart failure and stage 1 through stage 4 chronic kidney disease, or unspecified chronic kidney disease: Secondary | ICD-10-CM | POA: Insufficient documentation

## 2018-09-17 DIAGNOSIS — K529 Noninfective gastroenteritis and colitis, unspecified: Secondary | ICD-10-CM | POA: Diagnosis not present

## 2018-09-17 DIAGNOSIS — Z992 Dependence on renal dialysis: Secondary | ICD-10-CM | POA: Insufficient documentation

## 2018-09-17 DIAGNOSIS — D122 Benign neoplasm of ascending colon: Secondary | ICD-10-CM | POA: Diagnosis not present

## 2018-09-17 DIAGNOSIS — Z8673 Personal history of transient ischemic attack (TIA), and cerebral infarction without residual deficits: Secondary | ICD-10-CM | POA: Diagnosis not present

## 2018-09-17 DIAGNOSIS — R634 Abnormal weight loss: Secondary | ICD-10-CM | POA: Diagnosis not present

## 2018-09-17 DIAGNOSIS — Z923 Personal history of irradiation: Secondary | ICD-10-CM | POA: Insufficient documentation

## 2018-09-17 DIAGNOSIS — Z980 Intestinal bypass and anastomosis status: Secondary | ICD-10-CM | POA: Insufficient documentation

## 2018-09-17 DIAGNOSIS — Z6823 Body mass index (BMI) 23.0-23.9, adult: Secondary | ICD-10-CM | POA: Diagnosis not present

## 2018-09-17 DIAGNOSIS — G4730 Sleep apnea, unspecified: Secondary | ICD-10-CM | POA: Insufficient documentation

## 2018-09-17 DIAGNOSIS — N3281 Overactive bladder: Secondary | ICD-10-CM | POA: Insufficient documentation

## 2018-09-17 DIAGNOSIS — G609 Hereditary and idiopathic neuropathy, unspecified: Secondary | ICD-10-CM | POA: Diagnosis not present

## 2018-09-17 DIAGNOSIS — I272 Pulmonary hypertension, unspecified: Secondary | ICD-10-CM | POA: Insufficient documentation

## 2018-09-17 DIAGNOSIS — J449 Chronic obstructive pulmonary disease, unspecified: Secondary | ICD-10-CM | POA: Diagnosis not present

## 2018-09-17 DIAGNOSIS — N189 Chronic kidney disease, unspecified: Secondary | ICD-10-CM | POA: Insufficient documentation

## 2018-09-17 DIAGNOSIS — M109 Gout, unspecified: Secondary | ICD-10-CM | POA: Insufficient documentation

## 2018-09-17 DIAGNOSIS — E669 Obesity, unspecified: Secondary | ICD-10-CM | POA: Insufficient documentation

## 2018-09-17 DIAGNOSIS — I509 Heart failure, unspecified: Secondary | ICD-10-CM | POA: Diagnosis not present

## 2018-09-17 DIAGNOSIS — M1990 Unspecified osteoarthritis, unspecified site: Secondary | ICD-10-CM | POA: Diagnosis not present

## 2018-09-17 DIAGNOSIS — Z87891 Personal history of nicotine dependence: Secondary | ICD-10-CM | POA: Insufficient documentation

## 2018-09-17 DIAGNOSIS — K3189 Other diseases of stomach and duodenum: Secondary | ICD-10-CM

## 2018-09-17 DIAGNOSIS — R197 Diarrhea, unspecified: Secondary | ICD-10-CM

## 2018-09-17 DIAGNOSIS — K624 Stenosis of anus and rectum: Secondary | ICD-10-CM | POA: Diagnosis not present

## 2018-09-17 DIAGNOSIS — K5790 Diverticulosis of intestine, part unspecified, without perforation or abscess without bleeding: Secondary | ICD-10-CM | POA: Diagnosis not present

## 2018-09-17 HISTORY — PX: BIOPSY: SHX5522

## 2018-09-17 HISTORY — PX: COLONOSCOPY WITH PROPOFOL: SHX5780

## 2018-09-17 HISTORY — PX: ESOPHAGOGASTRODUODENOSCOPY (EGD) WITH PROPOFOL: SHX5813

## 2018-09-17 LAB — POCT I-STAT 4, (NA,K, GLUC, HGB,HCT)
Glucose, Bld: 96 mg/dL (ref 70–99)
HCT: 44 % (ref 39.0–52.0)
Hemoglobin: 15 g/dL (ref 13.0–17.0)
POTASSIUM: 4.4 mmol/L (ref 3.5–5.1)
Sodium: 139 mmol/L (ref 135–145)

## 2018-09-17 SURGERY — ESOPHAGOGASTRODUODENOSCOPY (EGD) WITH PROPOFOL
Anesthesia: Monitor Anesthesia Care

## 2018-09-17 MED ORDER — KETAMINE HCL 10 MG/ML IJ SOLN
INTRAMUSCULAR | Status: DC | PRN
  Administered 2018-09-17: 20 mg via INTRAVENOUS
  Administered 2018-09-17: 10 mg via INTRAVENOUS

## 2018-09-17 MED ORDER — PROPOFOL 500 MG/50ML IV EMUL
INTRAVENOUS | Status: DC | PRN
  Administered 2018-09-17: 25 mg via INTRAVENOUS

## 2018-09-17 MED ORDER — PHENYLEPHRINE HCL 10 MG/ML IJ SOLN
INTRAMUSCULAR | Status: DC | PRN
  Administered 2018-09-17 (×2): 60 ug via INTRAVENOUS

## 2018-09-17 MED ORDER — LACTATED RINGERS IV SOLN
INTRAVENOUS | Status: DC | PRN
  Administered 2018-09-17 (×2): via INTRAVENOUS

## 2018-09-17 MED ORDER — KETAMINE HCL 10 MG/ML IJ SOLN
INTRAMUSCULAR | Status: AC
  Filled 2018-09-17: qty 1

## 2018-09-17 MED ORDER — PROPOFOL 10 MG/ML IV BOLUS
INTRAVENOUS | Status: AC
  Filled 2018-09-17: qty 40

## 2018-09-17 MED ORDER — PROPOFOL 500 MG/50ML IV EMUL
INTRAVENOUS | Status: DC | PRN
  Administered 2018-09-17: 50 ug/kg/min via INTRAVENOUS

## 2018-09-17 SURGICAL SUPPLY — 25 items

## 2018-09-17 NOTE — Discharge Instructions (Signed)
Upper Endoscopy, Adult Upper endoscopy is a procedure to look inside the upper GI (gastrointestinal) tract. The upper GI tract is made up of:  The part of the body that moves food from your mouth to your stomach (esophagus).  The stomach.  The first part of your small intestine (duodenum). This procedure is also called esophagogastroduodenoscopy (EGD) or gastroscopy. In this procedure, your health care provider passes a thin, flexible tube (endoscope) through your mouth and down your esophagus into your stomach. A small camera is attached to the end of the tube. Images from the camera appear on a monitor in the exam room. During this procedure, your health care provider may also remove a small piece of tissue to be sent to a lab and examined under a microscope (biopsy). Your health care provider may do an upper endoscopy to diagnose cancers of the upper GI tract. You may also have this procedure to find the cause of other conditions, such as:  Stomach pain.  Heartburn.  Pain or problems when swallowing.  Nausea and vomiting.  Stomach bleeding.  Stomach ulcers. Tell a health care provider about:  Any allergies you have.  All medicines you are taking, including vitamins, herbs, eye drops, creams, and over-the-counter medicines.  Any problems you or family members have had with anesthetic medicines.  Any blood disorders you have.  Any surgeries you have had.  Any medical conditions you have.  Whether you are pregnant or may be pregnant. What are the risks? Generally, this is a safe procedure. However, problems may occur, including:  Infection.  Bleeding.  Allergic reactions to medicines.  A tear or hole (perforation) in the esophagus, stomach, or duodenum. What happens before the procedure? Staying hydrated Follow instructions from your health care provider about hydration, which may include:  Up to 2 hours before the procedure - you may continue to drink clear  liquids, such as water, clear fruit juice, black coffee, and plain tea.  Eating and drinking restrictions Follow instructions from your health care provider about eating and drinking, which may include:  8 hours before the procedure - stop eating heavy meals or foods, such as meat, fried foods, or fatty foods.  6 hours before the procedure - stop eating light meals or foods, such as toast or cereal.  6 hours before the procedure - stop drinking milk or drinks that contain milk.  2 hours before the procedure - stop drinking clear liquids. Medicines Ask your health care provider about:  Changing or stopping your regular medicines. This is especially important if you are taking diabetes medicines or blood thinners.  Taking medicines such as aspirin and ibuprofen. These medicines can thin your blood. Do not take these medicines unless your health care provider tells you to take them.  Taking over-the-counter medicines, vitamins, herbs, and supplements. General instructions  Plan to have someone take you home from the hospital or clinic.  If you will be going home right after the procedure, plan to have someone with you for 24 hours.  Ask your health care provider what steps will be taken to help prevent infection. What happens during the procedure?   An IV will be inserted into one of your veins.  You may be given one or more of the following: ? A medicine to help you relax (sedative). ? A medicine to numb the throat (local anesthetic).  You will lie on your left side on an exam table.  Your health care provider will pass the endoscope through  your mouth and down your esophagus.  Your health care provider will use the scope to check the inside of your esophagus, stomach, and duodenum. Biopsies may be taken.  The endoscope will be removed. The procedure may vary among health care providers and hospitals. What happens after the procedure?  Your blood pressure, heart rate,  breathing rate, and blood oxygen level will be monitored until you leave the hospital or clinic.  Do not drive for 24 hours if you were given a sedative during your procedure.  When your throat is no longer numb, you may be given some fluids to drink.  It is up to you to get the results of your procedure. Ask your health care provider, or the department that is doing the procedure, when your results will be ready. Summary  Upper endoscopy is a procedure to look inside the upper GI tract.  During the procedure, an IV will be inserted into one of your veins. You may be given a medicine to help you relax.  A medicine will be used to numb your throat.  The endoscope will be passed through your mouth and down your esophagus. This information is not intended to replace advice given to you by your health care provider. Make sure you discuss any questions you have with your health care provider. Document Released: 06/20/2000 Document Revised: 11/23/2017 Document Reviewed: 11/23/2017 Elsevier Interactive Patient Education  2019 [employer].   Colonoscopy, Adult, Care After This sheet gives you information about how to care for yourself after your procedure. Your doctor may also give you more specific instructions. If you have problems or questions, call your doctor. What can I expect after the procedure? After the procedure, it is common to have:  A small amount of blood in your poop for 24 hours.  Some gas.  Mild cramping or bloating in your belly. Follow these instructions at home: General instructions  For the first 24 hours after the procedure: ? Do not drive or use machinery. ? Do not sign important documents. ? Do not drink alcohol. ? Do your daily activities more slowly than normal. ? Eat foods that are soft and easy to digest.  Take over-the-counter or prescription medicines only as told by your doctor. To help cramping and bloating:   Try walking around.  Put heat on  your belly (abdomen) as told by your doctor. Use a heat source that your doctor recommends, such as a moist heat pack or a heating pad. ? Put a towel between your skin and the heat source. ? Leave the heat on for 20-30 minutes. ? Remove the heat if your skin turns bright red. This is especially important if you cannot feel pain, heat, or cold. You can get burned. Eating and drinking   Drink enough fluid to keep your pee (urine) clear or pale yellow.  Return to your normal diet as told by your doctor. Avoid heavy or fried foods that are hard to digest.  Avoid drinking alcohol for as long as told by your doctor. Contact a doctor if:  You have blood in your poop (stool) 2-3 days after the procedure. Get help right away if:  You have more than a small amount of blood in your poop.  You see large clumps of tissue (blood clots) in your poop.  Your belly is swollen.  You feel sick to your stomach (nauseous).  You throw up (vomit).  You have a fever.  You have belly pain that gets worse, and  medicine does not help your pain. Summary  After the procedure, it is common to have a small amount of blood in your poop. You may also have mild cramping and bloating in your belly.  For the first 24 hours after the procedure, do not drive or use machinery, do not sign important documents, and do not drink alcohol.  Get help right away if you have a lot of blood in your poop, feel sick to your stomach, have a fever, or have more belly pain. This information is not intended to replace advice given to you by your health care provider. Make sure you discuss any questions you have with your health care provider. Document Released: 07/26/2010 Document Revised: 04/23/2017 Document Reviewed: 03/17/2016 Elsevier Interactive Patient Education  2019 [employer].

## 2018-09-17 NOTE — Op Note (Signed)
[facility] Patient Name: Jason [name] Procedure Date: 09/17/2018 MRN: [id number] Attending MD: Estill Cotta. Loletha Carrow , MD Date of Birth: Mar 12, 1946 CSN: [id number] Age: 73 Admit Type: Outpatient Procedure:                Upper GI endoscopy Indications:              Endoscopy to assess diarrhea in patient suspected                            of having disease of the small-bowel, Weight loss Providers:                Mallie Mussel L. Loletha Carrow, MD, Jeanella Cara, RN,                            Janie Billups, Technician, Rosario Adie,                            CRNA, Elmer Ramp. Tilden Dome, RN Referring MD:              Medicines:                Monitored Anesthesia Care Complications:            No immediate complications. Estimated Blood Loss:     Estimated blood loss: none. Procedure:                Pre-Anesthesia Assessment:                           - Prior to the procedure, a History and Physical                            was performed, and patient medications and                            allergies were reviewed. The patient's tolerance of                            previous anesthesia was also reviewed. The risks                            and benefits of the procedure and the sedation                            options and risks were discussed with the patient.                            All questions were answered, and informed consent                            was obtained. Prior Anticoagulants: The patient                            last took aspirin and Brilinta on the day of the  procedure (patient had a CVA requiring carotid                            stent 06/2018). ASA Grade Assessment: III - A                            patient with severe systemic disease. After                            reviewing the risks and benefits, the patient was                            deemed in satisfactory condition to undergo the    procedure.                           After obtaining informed consent, the endoscope was                            passed under direct vision. Throughout the                            procedure, the patient's blood pressure, pulse, and                            oxygen saturations were monitored continuously. The                            GIF-H190 ([id number]) Olympus gastroscope was                            introduced through the mouth, and advanced to the                            second part of duodenum. The upper GI endoscopy was                            accomplished without difficulty. The patient                            tolerated the procedure. He became briefly                            tachycardic during scope insertion, and was                            coughing secretions requring suctioning. Scope In: Scope Out: Findings:      The esophagus was normal.      A large amount of clear fluid was found in the entire examined stomach.       It was cleared wth suction for good visualization.      Erythematous mucosa was found in the prepyloric region of the stomach.      The cardia and gastric fundus were normal on retroflexion.      Congested mucosa without active bleeding and  with no stigmata of       bleeding was found in the duodenal bulb.      The exam of the duodenum was otherwise normal. Impression:               - Normal esophagus.                           - Clear gastric fluid.                           - Erythematous mucosa in the prepyloric region of                            the stomach.                           - Congested duodenal mucosa.                           - No specimens collected. Moderate Sedation:      Not Applicable - Patient had care per Anesthesia. Recommendation:           - Patient has a contact number available for                            emergencies. The signs and symptoms of potential                            delayed  complications were discussed with the                            patient. Return to normal activities tomorrow.                            Written discharge instructions were provided to the                            patient.                           - Resume previous diet.                           - Continue present medications.                           - See the other procedure note for documentation of                            additional recommendations. Procedure Code(s):        --- Professional ---                           [phone], Esophagogastroduodenoscopy, flexible,                            transoral; diagnostic, including collection of  specimen(s) by brushing or washing, when performed                            (separate procedure) Diagnosis Code(s):        --- Professional ---                           K31.89, Other diseases of stomach and duodenum                           R19.7, Diarrhea, unspecified                           R63.4, Abnormal weight loss CPT copyright 2018 American Medical Association. All rights reserved. The codes documented in this report are preliminary and upon coder review may  be revised to meet current compliance requirements. Esmeralda Blanford L. Loletha Carrow, MD 09/17/2018 8:19:29 AM This report has been signed electronically. Number of Addenda: 0

## 2018-09-17 NOTE — Op Note (Signed)
[facility] Patient Name: Jason [name] Procedure Date: 09/17/2018 MRN: [id number] Attending MD: Estill Cotta. Danis , MD Date of Birth: January 02, 1946 CSN: [id number] Age: 73 Admit Type: Outpatient Procedure:                Colonoscopy Indications:              Chronic diarrhea, Weight loss (prior history of C                            diff; recent negative infectious studies, no                            improvement with trial of metronidazole). Last                            colonoscopy 05/2016 for rectal cancer surveillance Providers:                Mallie Mussel L. Loletha Carrow, MD, Jeanella Cara, RN,                            Cherylynn Ridges, Technician, Rosario Adie, CRNA Referring MD:              Medicines:                Monitored Anesthesia Care Complications:            No immediate complications. Estimated Blood Loss:     Estimated blood loss was minimal. Procedure:                Pre-Anesthesia Assessment:                           - Prior to the procedure, a History and Physical                            was performed, and patient medications and                            allergies were reviewed. The patient's tolerance of                            previous anesthesia was also reviewed. The risks                            and benefits of the procedure and the sedation                            options and risks were discussed with the patient.                            All questions were answered, and informed consent                            was obtained. Prior Anticoagulants: The patient  last took aspirin and Brilinta on the day of the                            procedure (CVA requiring carotid stent Dec 2019).                            ASA Grade Assessment: III - A patient with severe                            systemic disease. After reviewing the risks and                            benefits, the patient was deemed in  satisfactory                            condition to undergo the procedure.                           After obtaining informed consent, the colonoscope                            was passed under direct vision. Throughout the                            procedure, the patient's blood pressure, pulse, and                            oxygen saturations were monitored continuously. The                            CF-HQ190L ([id number]) Olympus colonoscope was                            introduced through the anus and advanced to the the                            terminal ileum, with identification of the                            appendiceal orifice and IC valve. The PCF-H190DL                            ([id number]) Olympus pediatric colonscope was                            introduced through the and advanced to the. The                            colonoscopy was performed without difficulty. The                            patient tolerated the procedure well. The quality  of the bowel preparation was good. The terminal                            ileum, ileocecal valve, appendiceal orifice, and                            rectum were photographed. Scope In: 7:51:38 AM Scope Out: 8:09:50 AM Scope Withdrawal Time: 0 hours 8 minutes 26 seconds  Total Procedure Duration: 0 hours 18 minutes 12 seconds  Findings:      The digital rectal exam findings include anal stricture. This required       pinky finger dilation, resulting in self-limited bleeding and then       allowing passage of pediatric colonoscope.      The terminal ileum appeared normal.      A 12 mm polyp was found in the mid ascending colon. The polyp was       semi-pedunculated. The polyp was not removed because the patient is on       both aspirin and Brilinta (see above)      Normal mucosa was found in the entire colon. Biopsies for histology were       taken with a cold forceps from the right colon and left  colon (one each)       for evaluation of microscopic colitis. The biopsy sites were observed to       confirm bleeding was self-limited.      There was evidence of a prior end-to-end colo-rectal anastomosis in the       rectum. This was patent and was characterized by healthy appearing       mucosa.      The mucosa vascular pattern in the distal rectum was increased,       consistent with known radiation proctopathy.      Retroflexion in the rectum was not performed due to anatomy. Impression:               - Anal stricture found on digital rectal exam.                           - The examined portion of the ileum was normal.                           - One 12 mm polyp in the mid ascending colon.                           - Normal mucosa in the entire examined colon.                            Biopsied.                           - Patent end-to-end colo-rectal anastomosis,                            characterized by healthy appearing mucosa.                           - Increased mucosa vascular pattern in the distal  rectum. Moderate Sedation:      Not Applicable - Patient had care per Anesthesia. Recommendation:           - Patient has a contact number available for                            emergencies. The signs and symptoms of potential                            delayed complications were discussed with the                            patient. Return to normal activities tomorrow.                            Written discharge instructions were provided to the                            patient.                           - Resume previous diet.                           - Continue present medications.                           - Await pathology results.                           - See the other procedure note for documentation of                            additional recommendations.                           - Repeat colonoscopy in 10-12 months when  patient                            can be off Brilinta 5 days prior to procedure for                            polypectomy.                           - Return to my office at appointment to be                            scheduled. Procedure Code(s):        --- Professional ---                           [phone], Colonoscopy, flexible; with biopsy, single                            or multiple Diagnosis Code(s):        --- Professional ---  K62.4, Stenosis of anus and rectum                           D12.2, Benign neoplasm of ascending colon                           Z98.0, Intestinal bypass and anastomosis status                           K52.9, Noninfective gastroenteritis and colitis,                            unspecified                           R63.4, Abnormal weight loss CPT copyright 2018 American Medical Association. All rights reserved. The codes documented in this report are preliminary and upon coder review may  be revised to meet current compliance requirements. Henry L. Loletha Carrow, MD 09/17/2018 8:30:16 AM This report has been signed electronically. Number of Addenda: 0

## 2018-09-17 NOTE — Interval H&P Note (Signed)
History and Physical Interval Note:  09/17/2018 7:27 AM  [name]  has presented today for surgery, with the diagnosis of weight loss, diarrhea.  The various methods of treatment have been discussed with the patient and family. After consideration of risks, benefits and other options for treatment, the patient has consented to  Procedure(s): ESOPHAGOGASTRODUODENOSCOPY (EGD) WITH PROPOFOL (N/A) COLONOSCOPY WITH PROPOFOL (N/A) as a surgical intervention.  The patient's history has been reviewed, patient examined, no change in status, stable for surgery.  I have reviewed the patient's chart and labs.  Questions were answered to the patient's satisfaction.     Jason [name]

## 2018-09-17 NOTE — Anesthesia Postprocedure Evaluation (Signed)
Anesthesia Post Note  Patient: Jason LATORRE Sr.  Procedure(s) Performed: ESOPHAGOGASTRODUODENOSCOPY (EGD) WITH PROPOFOL (N/A ) COLONOSCOPY WITH PROPOFOL (N/A ) BIOPSY     Patient location during evaluation: PACU Anesthesia Type: MAC Level of consciousness: awake and alert Pain management: pain level controlled Vital Signs Assessment: post-procedure vital signs reviewed and stable Respiratory status: spontaneous breathing, nonlabored ventilation, respiratory function stable and patient connected to nasal cannula oxygen Cardiovascular status: stable and blood pressure returned to baseline Postop Assessment: no apparent nausea or vomiting Anesthetic complications: no    Last Vitals:  Vitals:   09/17/18 0830 09/17/18 0840  BP: (!) 110/40 124/65  Pulse: (!) 111   Resp: (!) 22 16  Temp:    SpO2: 92% 100%    Last Pain:  Vitals:   09/17/18 0840  TempSrc:   PainSc: 0-No pain                 Lucia Mccreadie

## 2018-09-17 NOTE — Anesthesia Procedure Notes (Signed)
Date/Time: 09/17/2018 7:31 AM Performed by: Glory Buff, CRNA Oxygen Delivery Method: Nasal cannula

## 2018-09-17 NOTE — Telephone Encounter (Addendum)
Received monitor strips on the pt... 3/12 7:58pm and 3/13 1:26am... pt had SVT R 153,156.Marland Kitchen  Pt had EGD and Colonoscopy this morning so he is home sleeping so I spoke with his wife and she reports that he did notn copmplain about any symptoms and feels he felt wekll exceot for some anxiity about the colonoscopy..  According to Endo... pt did not have any SVT on the monitor during his priocedrue and HR peaked at 100.Marland Kitchen  Spoke with Dr. Angelena Form (DOD) and he suggests the pt be seen by EP early next week.Marland Kitchen  Appt made with Dr. Caryl Comes 09/20/18 at 2:30pm... pt last saw him 03/2018.   She verbalized understanding to have the pt continue to monitor and if he starts to feel bad such as dizziness, sob.. to consider the ER while over the weekend.

## 2018-09-17 NOTE — Transfer of Care (Signed)
Immediate Anesthesia Transfer of Care Note  Patient: LUQMAN PERRELLI Sr.  Procedure(s) Performed: ESOPHAGOGASTRODUODENOSCOPY (EGD) WITH PROPOFOL (N/A ) COLONOSCOPY WITH PROPOFOL (N/A ) BIOPSY  Patient Location: PACU  Anesthesia Type:MAC  Level of Consciousness: awake, alert  and oriented  Airway & Oxygen Therapy: Patient Spontanous Breathing and Patient connected to nasal cannula oxygen  Post-op Assessment: Report given to RN and Post -op Vital signs reviewed and stable  Post vital signs: Reviewed and stable  Last Vitals:  Vitals Value Taken Time  BP    Temp    Pulse    Resp 16 09/17/2018  8:18 AM  SpO2    Vitals shown include unvalidated device data.  Last Pain:  Vitals:   09/17/18 0633  TempSrc: Oral  PainSc: 0-No pain         Complications: No apparent anesthesia complications

## 2018-09-17 NOTE — H&P (Signed)
History:  This patient presents for endoscopic testing for chronic diarrhea  See Feb 2020 GI office note for details.  Regan K Valade Sr. Referring physician: [name], MD  Past Medical History: Past Medical History:  Diagnosis Date  . Anemia   . Arthritis   . Chronic renal insufficiency 07/31/2011  . COPD (chronic obstructive pulmonary disease) ([place])    pt denies  . CVA (cerebral vascular accident) ([place]) 06/2018  . Deficiency anemia 07/20/2012  . Diverticulosis   . Edema 09/21/2010   Qualifier: Diagnosis of  By: Jerold Coombe    . ERECTILE DYSFUNCTION, MILD 05/11/2007   Qualifier: Diagnosis of  By: Sherren Mocha MD, Jory Ee   . Exertional dyspnea 10/30/2015  . GOUT 05/11/2007   Qualifier: Diagnosis of  By: Sherren Mocha MD, Jory Ee   . Hereditary and idiopathic peripheral neuropathy 09/14/2014  . History of radiation therapy 02/21/13-03/31/13   rectum 50.4Gy total dose  . Hypersomnia 10/30/2015  . Hypertension   . Lung nodule 10/30/2015  . OAB (overactive bladder) 01/09/2016  . Obesity (BMI 30.0-34.9) 10/30/2015  . Obstructive sleep apnea 07/29/2010   HST 11/2015 AHI 54.  On autocpap  >> 10 cm   . Peripheral edema 09/19/2013  . rectal ca dx'd 02/01/13   rectal. Radiation and chemotherapy- remains on chemotherapy-next tx. 08-22-13.   . S/P partial lobectomy of lung 11/20/2015  . S/P thoracotomy   . SUI (stress urinary incontinence), male 01/09/2016     Past Surgical History: Past Surgical History:  Procedure Laterality Date  . A/V FISTULAGRAM Left 06/14/2018   Procedure: A/V FISTULAGRAM;  Surgeon: Waynetta Sandy, MD;  Location: [facility];  Service: Cardiovascular;  Laterality: Left;  . AV FISTULA PLACEMENT Left 03/24/2016   Procedure: CREATION OF LEFT BRACIOCEPHALIC ARTERIOVENOUS (AV) FISTULA;  Surgeon: Elam Dutch, MD;  Location: [facility];  Service: Vascular;  Laterality: Left;  . COLON SURGERY  05/20/2013  . COLONOSCOPY  2010   [facility]  . EUS N/A 02/03/2013   Procedure: LOWER ENDOSCOPIC ULTRASOUND (EUS);  Surgeon: Milus Banister, MD;  Location: [facility];  Service: Endoscopy;  Laterality: N/A;  . ILEOSTOMY CLOSURE N/A 08/24/2013   Procedure: CLOSURE OF LOOP ILEOSTOMY ;  Surgeon: Leighton Ruff, MD;  Location: [facility];  Service: General;  Laterality: N/A;  . IR ANGIO VERTEBRAL SEL SUBCLAVIAN INNOMINATE BILAT MOD SED  06/08/2018  . IR CT HEAD LTD  06/08/2018  . IR INTRAVSC STENT CERV CAROTID W/O EMB-PROT MOD SED INC ANGIO  06/08/2018  . IR PERCUTANEOUS ART THROMBECTOMY/INFUSION INTRACRANIAL INC DIAG ANGIO  06/08/2018  . LAPAROSCOPIC LOW ANTERIOR RESECTION N/A 05/20/2013   Procedure: LAPAROSCOPIC LOW ANTERIOR RESECTION WITH SPLENIC FLEXURE MOBILIZATION;  Surgeon: Leighton Ruff, MD;  Location: [facility];  Service: General;  Laterality: N/A;  . OSTOMY N/A 05/20/2013   Procedure: diverting OSTOMY;  Surgeon: Leighton Ruff, MD;  Location: [facility];  Service: General;  Laterality: N/A;  . PERIPHERAL VASCULAR INTERVENTION Left 06/14/2018   Procedure: PERIPHERAL VASCULAR INTERVENTION;  Surgeon: Waynetta Sandy, MD;  Location: [facility];  Service: Cardiovascular;  Laterality: Left;  AVF on the Left  . RADIOLOGY WITH ANESTHESIA N/A 06/08/2018   Procedure: IR WITH ANESTHESIA;  Surgeon: Radiologist, Medication, MD;  Location: [facility];  Service: Radiology;  Laterality: N/A;  . VIDEO ASSISTED THORACOSCOPY (VATS)/WEDGE RESECTION Left 11/20/2015   Procedure: VIDEO ASSISTED THORACOSCOPY (VATS)/LUNG RESECTION;  Surgeon: [name], MD;  Location: [facility];  Service: Thoracic;  Laterality: Left;  Marland Kitchen VIDEO BRONCHOSCOPY N/A 11/20/2015   Procedure: VIDEO BRONCHOSCOPY;  Surgeon: [name], MD;  Location: [facility];  Service: Thoracic;  Laterality: N/A;    Allergies: Allergies  Allergen Reactions  . Nsaids Other (See Comments)    Asked by surgeon to add this medication class as intolerance due to patients renal insufficiency.  . Lactose Intolerance (Gi)      Diarrhea   . Amlodipine Other (See Comments)    Edema     Outpatient Meds: No current facility-administered medications for this encounter.    Facility-Administered Medications Ordered in Other Encounters  Medication Dose Route Frequency Provider Last Rate Last Dose  . lactated ringers infusion    Continuous PRN Glory Buff, CRNA          ___________________________________________________________________ Objective   Exam:  BP 126/65   Pulse (!) 105   Temp 99 F (37.2 C) (Oral)   Resp 18   Ht 5\' 8"  (1.727 m)   Wt 70.5 kg   SpO2 100%   BMI 23.63 kg/m    CV: RRR without murmur, S1/S2, no JVD, no peripheral edema  Resp: clear to auscultation bilaterally, normal RR and effort noted  GI: soft, no tenderness, with active bowel sounds. No guarding or palpable organomegaly noted.  Neuro: awake, alert and oriented x 3. Normal gross motor function and fluent speech  Wearing a loop recorder  Assessment:  Chronic diarrhea  Plan:  EGD/colonoscopy with biopsies.  On brilinta /asa for carotid stent placed during CVA Dec 2019  Had dialysis yesterday, potassium normal today   Jason [name]

## 2018-09-18 DIAGNOSIS — Z23 Encounter for immunization: Secondary | ICD-10-CM | POA: Diagnosis not present

## 2018-09-18 DIAGNOSIS — R197 Diarrhea, unspecified: Secondary | ICD-10-CM | POA: Diagnosis not present

## 2018-09-18 DIAGNOSIS — D689 Coagulation defect, unspecified: Secondary | ICD-10-CM | POA: Diagnosis not present

## 2018-09-18 DIAGNOSIS — N2581 Secondary hyperparathyroidism of renal origin: Secondary | ICD-10-CM | POA: Diagnosis not present

## 2018-09-18 DIAGNOSIS — N186 End stage renal disease: Secondary | ICD-10-CM | POA: Diagnosis not present

## 2018-09-20 ENCOUNTER — Ambulatory Visit: Payer: Medicare Other | Admitting: Internal Medicine

## 2018-09-20 ENCOUNTER — Encounter: Payer: Self-pay | Admitting: Internal Medicine

## 2018-09-20 ENCOUNTER — Ambulatory Visit (INDEPENDENT_AMBULATORY_CARE_PROVIDER_SITE_OTHER): Payer: Medicare Other | Admitting: Internal Medicine

## 2018-09-20 ENCOUNTER — Other Ambulatory Visit: Payer: Self-pay

## 2018-09-20 VITALS — BP 130/70 | HR 70 | Temp 99.2°F | Ht 68.0 in | Wt 156.0 lb

## 2018-09-20 DIAGNOSIS — I63413 Cerebral infarction due to embolism of bilateral middle cerebral arteries: Secondary | ICD-10-CM

## 2018-09-20 DIAGNOSIS — I4891 Unspecified atrial fibrillation: Secondary | ICD-10-CM

## 2018-09-20 DIAGNOSIS — I6601 Occlusion and stenosis of right middle cerebral artery: Secondary | ICD-10-CM

## 2018-09-20 DIAGNOSIS — I6310 Cerebral infarction due to embolism of unspecified precerebral artery: Secondary | ICD-10-CM | POA: Diagnosis not present

## 2018-09-20 DIAGNOSIS — I4811 Longstanding persistent atrial fibrillation: Secondary | ICD-10-CM | POA: Diagnosis not present

## 2018-09-20 MED ORDER — APIXABAN 2.5 MG PO TABS: 2.5000 mg | ORAL_TABLET | Freq: Two times a day (BID) | ORAL | 0 refills | Status: DC

## 2018-09-20 MED ORDER — METOPROLOL SUCCINATE ER 25 MG PO TB24: 25.0000 mg | ORAL_TABLET | Freq: Every day | ORAL | 0 refills | Status: DC

## 2018-09-20 NOTE — Patient Instructions (Signed)
Atrial Fibrillation Atrial fibrillation is a type of irregular or rapid heartbeat (arrhythmia). In atrial fibrillation, the top part of the heart (atria) quivers in a chaotic pattern. This makes the heart unable to pump blood normally. Having atrial fibrillation can increase your risk for other health problems, such as:  Blood can pool in the atria and form clots. If a clot travels to the brain, it can cause a stroke.  The heart muscle may weaken from the irregular blood flow. This can cause heart failure. Atrial fibrillation may start suddenly and stop on its own, or it may become a long-lasting problem. What are the causes? This condition is caused by some heart-related conditions or procedures, including:  High blood pressure. This is the most common cause.  Heart failure.  Heart valve conditions.  Inflammation of the sac that surrounds the heart (pericarditis).  Heart surgery.  Coronary artery disease.  Certain heart rhythm disorders, such as Wolf-Parkinson-White syndrome. Other causes include:  Pneumonia.  Obstructive sleep apnea.  Lung cancer.  Thyroid problems, especially if the thyroid is overactive (hyperthyroidism).  Excessive alcohol or drug use. Sometimes, the cause of this condition is not known. What increases the risk? This condition is more likely to develop in:  Older people.  People who smoke.  People who have diabetes mellitus.  People who are overweight (obese).  Athletes who exercise vigorously.  People who have a family history. What are the signs or symptoms? Symptoms of this condition include:  A feeling that your heart is beating rapidly or irregularly.  A feeling of discomfort or pain in your chest.  Shortness of breath.  Sudden light-headedness or weakness.  Getting tired easily during exercise. In some cases, there are no symptoms. How is this diagnosed? Your health care provider may be able to detect atrial fibrillation when  taking your pulse. If detected, this condition may be diagnosed with:  Electrocardiogram (ECG).  Ambulatory cardiac monitor. This device records your heartbeats for 24 hours or more.  Transthoracic echocardiogram (TTE) to evaluate how blood flows through your heart.  Transesophageal echocardiogram (TEE) to view more detailed images of your heart.  A stress test.  Imaging tests, such as a CT scan or chest X-ray.  Blood tests. How is this treated? This condition may be treated with:  Medicines to slow down the heart rate or bring the heart's rhythm back to normal.  Medicines to prevent blood clots from forming.  Electrical cardioversion. This delivers a low-energy shock to the heart to reset its rhythm.  Ablation. This procedure destroys the part of the heart tissue that sends abnormal signals.  Left atrial appendage occlusion/excision. This seals off a common place in the atria where blood clots can form (left atrial appendage). The goal of treatment is to prevent blood clots from forming and to keep your heart beating at a normal rate and rhythm. Treatment depends on underlying medical conditions and how you feel when you are experiencing fibrillation. Follow these instructions at home: Medicines  Take over-the counter and prescription medicines only as told by your health care provider.  If your health care provider prescribed a blood-thinning medicine (anticoagulant), take it exactly as told. Taking too much blood-thinning medicine can cause bleeding. Taking too little can enable a blood clot to form and travel to the brain, causing a stroke. Lifestyle      Do not use any products that contain nicotine or tobacco, such as cigarettes and e-cigarettes. If you need help quitting, ask your health   care provider.  Do not drink beverages that contain caffeine, such as coffee, soda, and tea.  Follow diet instructions as told by your health care provider.  Exercise regularly as  told by your health care provider.  Do not drink alcohol. General instructions  If you have obstructive sleep apnea, manage your condition as told by your health care provider.  Maintain a healthy weight. Do not use diet pills unless your health care provider approves. Diet pills may make heart problems worse.  Keep all follow-up visits as told by your health care provider. This is important. Contact a health care provider if you:  Notice a change in the rate, rhythm, or strength of your heartbeat.  Are taking an anticoagulant and you notice increased bruising.  Tire more easily when you exercise or exert yourself.  Have a sudden change in weight. Get help right away if you have:   Chest pain, abdominal pain, sweating, or weakness.  Difficulty breathing.  Blood in your vomit, stool (feces), or urine.  Any symptoms of a stroke. "BE FAST" is an easy way to remember the main warning signs of a stroke: ? B - Balance. Signs are dizziness, sudden trouble walking, or loss of balance. ? E - Eyes. Signs are trouble seeing or a sudden change in vision. ? F - Face. Signs are sudden weakness or numbness of the face, or the face or eyelid drooping on one side. ? A - Arms. Signs are weakness or numbness in an arm. This happens suddenly and usually on one side of the body. ? S - Speech. Signs are sudden trouble speaking, slurred speech, or trouble understanding what people say. ? T - Time. Time to call emergency services. Write down what time symptoms started.  Other signs of a stroke, such as: ? A sudden, severe headache with no known cause. ? Nausea or vomiting. ? Seizure. These symptoms may represent a serious problem that is an emergency. Do not wait to see if the symptoms will go away. Get medical help right away. Call your local emergency services (911 in the [place] Do not drive yourself to the hospital. Summary  Atrial fibrillation is a type of irregular or rapid heartbeat  (arrhythmia).  Symptoms include a feeling that your heart is beating fast or irregularly. In some cases, you may not have symptoms.  The condition is treated with medicines to slow down the heart rate or bring the heart's rhythm back to normal. You may also need blood-thinning medicines to prevent blood clots.  Get help right away if you have symptoms or signs of a stroke. This information is not intended to replace advice given to you by your health care provider. Make sure you discuss any questions you have with your health care provider. Document Released: 06/23/2005 Document Revised: 08/14/2017 Document Reviewed: 08/14/2017 Elsevier Interactive Patient Education  2019 Elsevier Inc.  

## 2018-09-20 NOTE — Progress Notes (Signed)
Subjective:  Patient ID: Jason Cotta Sr., male    DOB: 13-Sep-1945  Age: 73 y.o. MRN: [id number]  CC: Atrial Fibrillation   HPI Jason SCHULKE Sr. presents for f/up - He had a CVA about 3 months ago.  I got a call this morning from an electrophysiologist, Dr. Caryl Comes, who told me that his recent heart monitor test was showing atrial fibrillation.  Dr. Caryl Comes asked me to place him on a DOAC and a rate control agent.  The patient has a history of intolerance to calcium channel blockers.  He continues to complain of diarrhea, dizziness, and lightheadedness.  He is not aware of palpitations, shortness of breath, or chest pain.  Outpatient Medications Prior to Visit  Medication Sig Dispense Refill  . acetaminophen (TYLENOL) 325 MG tablet Take 1-2 tablets (325-650 mg total) by mouth every 4 (four) hours as needed for mild pain.    Marland Kitchen allopurinol (ZYLOPRIM) 100 MG tablet Take 1 tablet (100 mg total) by mouth daily. 30 tablet 1  . atorvastatin (LIPITOR) 20 MG tablet TAKE 1 TABLET BY MOUTH  DAILY AT 6 PM. (Patient taking differently: Take 20 mg by mouth daily at 6 PM. ) 90 tablet 1  . calcitRIOL (ROCALTROL) 0.25 MCG capsule TAKE 1 CAPSULE BY MOUTH ONCE DAILY 30 capsule 5  . Cholecalciferol (VITAMIN D-3) 125 MCG (5000 UT) TABS Take 5,000 Units by mouth daily.    . diphenoxylate-atropine (LOMOTIL) 2.5-0.025 MG tablet Take 2 tablets by mouth 4 (four) times daily as needed for diarrhea or loose stools. 60 tablet 2  . ferric citrate (AURYXIA) 1 GM 210 MG(Fe) tablet Take 420 mg by mouth 3 (three) times daily with meals.    . gabapentin (NEURONTIN) 300 MG capsule TAKE 1 CAPSULE BY MOUTH EVERY DAY AT BEDTIME. PT NEEDS APPT PRIOR TO FUTURE REFILLS (Patient taking differently: Take 300 mg by mouth at bedtime. ) 30 capsule 0  . multivitamin (RENA-VIT) TABS tablet TAKE 1 TABLET BY MOUTH AT BEDTIME 90 each 1  . polycarbophil (FIBERCON) 625 MG tablet Take 2 tablets (1,250 mg total) by mouth 2 (two) times daily.    Marland Kitchen  aspirin 81 MG chewable tablet Chew 1 tablet (81 mg total) by mouth daily. 100 tablet 0  . BRILINTA 90 MG TABS tablet TAKE 1 TABLET BY MOUTH TWICE DAILY (Patient taking differently: Take 90 mg by mouth 2 (two) times daily. ) 60 tablet 0   No facility-administered medications prior to visit.     ROS Review of Systems  Constitutional: Negative.  Negative for appetite change, diaphoresis, fatigue and unexpected weight change.  HENT: Negative.   Eyes: Negative for visual disturbance.  Respiratory: Negative for cough, chest tightness, shortness of breath and wheezing.   Cardiovascular: Negative for chest pain, palpitations and leg swelling.  Gastrointestinal: Positive for diarrhea. Negative for abdominal pain, anal bleeding, blood in stool, constipation, nausea and vomiting.  Genitourinary: Negative.  Negative for difficulty urinating and dysuria.  Musculoskeletal: Negative.   Skin: Negative.  Negative for pallor.  Neurological: Positive for dizziness and light-headedness. Negative for weakness and numbness.  Hematological: Negative for adenopathy. Does not bruise/bleed easily.  Psychiatric/Behavioral: Negative.     Objective:  BP 130/70 (BP Location: Left Arm, Patient Position: Sitting, Cuff Size: Normal)   Pulse 70   Temp 99.2 F (37.3 C) (Oral)   Ht 5\' 8"  (1.727 m)   Wt 156 lb (70.8 kg)   SpO2 (!) 88%   BMI 23.72 kg/m   BP  Readings from Last 3 Encounters:  09/20/18 130/70  09/17/18 124/65  09/03/18 (!) 104/58    Wt Readings from Last 3 Encounters:  09/20/18 156 lb (70.8 kg)  09/17/18 155 lb 6.8 oz (70.5 kg)  09/03/18 155 lb 6 oz (70.5 kg)    Physical Exam Vitals signs reviewed.  HENT:     Nose: Nose normal.     Mouth/Throat:     Mouth: Mucous membranes are moist.     Pharynx: No oropharyngeal exudate or posterior oropharyngeal erythema.  Eyes:     General: No scleral icterus.    Conjunctiva/sclera: Conjunctivae normal.  Neck:     Musculoskeletal: Normal range of  motion and neck supple.  Cardiovascular:     Rate and Rhythm: Normal rate. Occasional extrasystoles are present.    Heart sounds: No murmur. No gallop.      Comments: EKG -  Sinus  Rhythm  - occasional ectopic ventricular beat    -  Diffuse nonspecific T-abnormality.   ABNORMAL     Pulmonary:     Effort: Pulmonary effort is normal.     Breath sounds: No stridor. No wheezing, rhonchi or rales.  Abdominal:     General: Bowel sounds are normal.     Palpations: There is no hepatomegaly, splenomegaly or mass.     Tenderness: There is no abdominal tenderness. There is no guarding.  Musculoskeletal: Normal range of motion.        General: No swelling.     Right lower leg: No edema.     Left lower leg: No edema.  Skin:    General: Skin is warm and dry.  Neurological:     General: No focal deficit present.     Mental Status: He is oriented to person, place, and time. Mental status is at baseline.     Lab Results  Component Value Date   WBC 6.8 08/10/2018   HGB 15.0 09/17/2018   HCT 44.0 09/17/2018   PLT 267.0 08/10/2018   GLUCOSE 96 09/17/2018   CHOL 151 08/17/2018   TRIG 114 08/17/2018   HDL 66 08/17/2018   LDLDIRECT 120.0 06/04/2015   LDLCALC 62 08/17/2018   ALT 18 07/28/2018   AST 23 07/28/2018   NA 139 09/17/2018   K 4.4 09/17/2018   CL 96 (L) 07/28/2018   CREATININE 5.49 (H) 07/28/2018   BUN 20 07/28/2018   CO2 29 07/28/2018   TSH 5.41 (H) 09/23/2017   PSA 1.86 08/10/2013   INR 1.06 06/08/2018   HGBA1C 5.5 06/09/2018    No results found.  Assessment & Plan:   Jason [name] was seen today for atrial fibrillation.  Diagnoses and all orders for this visit:  Longstanding persistent atrial fibrillation  Atrial fibrillation, unspecified type ([place])- His EKG today shows sinus rhythm with a few PVCs.  The classification of his atrial fibrillation is likely paroxysmal atrial fibrillation.  Will start anticoagulation with renally dosed apixaban.  We will also start rate  control with a beta-blocker.  He will be seen in the next few months in the A. fib clinic. -     EKG 12-Lead -     metoprolol succinate (TOPROL-XL) 25 MG 24 hr tablet; Take 1 tablet (25 mg total) by mouth daily. -     apixaban (ELIQUIS) 2.5 MG TABS tablet; Take 1 tablet (2.5 mg total) by mouth 2 (two) times daily. -     Ambulatory referral to Cardiology  Cerebrovascular accident (CVA) due to bilateral embolism of middle  cerebral arteries (HCC) -     apixaban (ELIQUIS) 2.5 MG TABS tablet; Take 1 tablet (2.5 mg total) by mouth 2 (two) times daily.  Cerebrovascular accident (CVA) due to embolism of precerebral artery ([place])  Middle cerebral artery embolism, right s/p Mechanical Thrombectomy   I have discontinued Jason Cotta Sr.'s aspirin and Brilinta. I am also having him start on metoprolol succinate and apixaban. Additionally, I am having him maintain his acetaminophen, polycarbophil, multivitamin, atorvastatin, gabapentin, calcitRIOL, allopurinol, diphenoxylate-atropine, Vitamin D-3, and ferric citrate.  Meds ordered this encounter  Medications  . metoprolol succinate (TOPROL-XL) 25 MG 24 hr tablet    Sig: Take 1 tablet (25 mg total) by mouth daily.    Dispense:  90 tablet    Refill:  0  . apixaban (ELIQUIS) 2.5 MG TABS tablet    Sig: Take 1 tablet (2.5 mg total) by mouth 2 (two) times daily.    Dispense:  180 tablet    Refill:  0     Follow-up: Return in about 3 months (around 12/21/2018).  [name], MD

## 2018-09-21 DIAGNOSIS — R197 Diarrhea, unspecified: Secondary | ICD-10-CM | POA: Diagnosis not present

## 2018-09-21 DIAGNOSIS — N186 End stage renal disease: Secondary | ICD-10-CM | POA: Diagnosis not present

## 2018-09-21 DIAGNOSIS — D689 Coagulation defect, unspecified: Secondary | ICD-10-CM | POA: Diagnosis not present

## 2018-09-21 DIAGNOSIS — Z23 Encounter for immunization: Secondary | ICD-10-CM | POA: Diagnosis not present

## 2018-09-21 DIAGNOSIS — N2581 Secondary hyperparathyroidism of renal origin: Secondary | ICD-10-CM | POA: Diagnosis not present

## 2018-09-22 ENCOUNTER — Telehealth: Payer: Self-pay | Admitting: Gastroenterology

## 2018-09-22 ENCOUNTER — Institutional Professional Consult (permissible substitution): Payer: Medicare Other | Admitting: Internal Medicine

## 2018-09-22 ENCOUNTER — Encounter: Payer: Medicare Other | Admitting: Occupational Therapy

## 2018-09-22 NOTE — Telephone Encounter (Signed)
Noted  

## 2018-09-22 NOTE — Telephone Encounter (Signed)
I sent a result note to TT with a plan for this patient.

## 2018-09-22 NOTE — Telephone Encounter (Signed)
Pt requested a CB concerning diarrhea.

## 2018-09-22 NOTE — Telephone Encounter (Signed)
Pt states his diarrhea had stopped after his colonoscopy but now it has started back. Pt reports he is having 3-4 diarrhea stools/day. He is taking Imodium and Lomotil. Please advise.

## 2018-09-23 ENCOUNTER — Other Ambulatory Visit: Payer: Self-pay

## 2018-09-23 DIAGNOSIS — N2581 Secondary hyperparathyroidism of renal origin: Secondary | ICD-10-CM | POA: Diagnosis not present

## 2018-09-23 DIAGNOSIS — Z23 Encounter for immunization: Secondary | ICD-10-CM | POA: Diagnosis not present

## 2018-09-23 DIAGNOSIS — N186 End stage renal disease: Secondary | ICD-10-CM | POA: Diagnosis not present

## 2018-09-23 DIAGNOSIS — D689 Coagulation defect, unspecified: Secondary | ICD-10-CM | POA: Diagnosis not present

## 2018-09-23 DIAGNOSIS — R197 Diarrhea, unspecified: Secondary | ICD-10-CM | POA: Diagnosis not present

## 2018-09-23 MED ORDER — PANCRELIPASE (LIP-PROT-AMYL) 36000-114000 UNITS PO CPEP: ORAL_CAPSULE | ORAL | 0 refills | Status: DC

## 2018-09-24 DIAGNOSIS — G4733 Obstructive sleep apnea (adult) (pediatric): Secondary | ICD-10-CM | POA: Diagnosis not present

## 2018-09-25 DIAGNOSIS — Z23 Encounter for immunization: Secondary | ICD-10-CM | POA: Diagnosis not present

## 2018-09-25 DIAGNOSIS — D689 Coagulation defect, unspecified: Secondary | ICD-10-CM | POA: Diagnosis not present

## 2018-09-25 DIAGNOSIS — N186 End stage renal disease: Secondary | ICD-10-CM | POA: Diagnosis not present

## 2018-09-25 DIAGNOSIS — N2581 Secondary hyperparathyroidism of renal origin: Secondary | ICD-10-CM | POA: Diagnosis not present

## 2018-09-25 DIAGNOSIS — R197 Diarrhea, unspecified: Secondary | ICD-10-CM | POA: Diagnosis not present

## 2018-09-27 ENCOUNTER — Encounter: Payer: Medicare Other | Admitting: Occupational Therapy

## 2018-09-27 ENCOUNTER — Ambulatory Visit: Payer: Medicare Other | Admitting: Speech Pathology

## 2018-09-27 ENCOUNTER — Telehealth: Payer: Self-pay | Admitting: Gastroenterology

## 2018-09-27 ENCOUNTER — Ambulatory Visit: Payer: Medicare Other | Admitting: Physical Therapy

## 2018-09-27 NOTE — Telephone Encounter (Signed)
Pt picked up creon samples on 09/23/18. Pt calling stating that the samples have not changed his diarrhea at all, reports it is not working and is requesting something else. Please advise.

## 2018-09-28 DIAGNOSIS — N186 End stage renal disease: Secondary | ICD-10-CM | POA: Diagnosis not present

## 2018-09-28 DIAGNOSIS — R197 Diarrhea, unspecified: Secondary | ICD-10-CM | POA: Diagnosis not present

## 2018-09-28 DIAGNOSIS — Z23 Encounter for immunization: Secondary | ICD-10-CM | POA: Diagnosis not present

## 2018-09-28 DIAGNOSIS — D689 Coagulation defect, unspecified: Secondary | ICD-10-CM | POA: Diagnosis not present

## 2018-09-28 DIAGNOSIS — N2581 Secondary hyperparathyroidism of renal origin: Secondary | ICD-10-CM | POA: Diagnosis not present

## 2018-09-28 MED ORDER — VANCOMYCIN HCL 250 MG PO CAPS: 250.0000 mg | ORAL_CAPSULE | Freq: Four times a day (QID) | ORAL | 0 refills | Status: AC

## 2018-09-28 NOTE — Telephone Encounter (Signed)
I'm sorry it has been so difficult to get this under control, as his testing so far has not given [place] an answer.  He has a prior C difficile infection in 2015.  Although his January C diff test was negative, I feel we should treat him as if he may have recurrence of that infection.  I sent a prescription for vancomycin 250 mg tablet.  Take one tablet four times daily x 14 days.

## 2018-09-28 NOTE — Telephone Encounter (Signed)
The patient has been notified of this information and all questions answered.

## 2018-09-30 ENCOUNTER — Encounter: Payer: Medicare Other | Admitting: Speech Pathology

## 2018-09-30 ENCOUNTER — Ambulatory Visit: Payer: Medicare Other | Admitting: Rehabilitative and Restorative Service Providers"

## 2018-09-30 ENCOUNTER — Encounter: Payer: Medicare Other | Admitting: Occupational Therapy

## 2018-09-30 DIAGNOSIS — R197 Diarrhea, unspecified: Secondary | ICD-10-CM | POA: Diagnosis not present

## 2018-09-30 DIAGNOSIS — N2581 Secondary hyperparathyroidism of renal origin: Secondary | ICD-10-CM | POA: Diagnosis not present

## 2018-09-30 DIAGNOSIS — D689 Coagulation defect, unspecified: Secondary | ICD-10-CM | POA: Diagnosis not present

## 2018-09-30 DIAGNOSIS — N186 End stage renal disease: Secondary | ICD-10-CM | POA: Diagnosis not present

## 2018-09-30 DIAGNOSIS — Z23 Encounter for immunization: Secondary | ICD-10-CM | POA: Diagnosis not present

## 2018-10-02 DIAGNOSIS — R197 Diarrhea, unspecified: Secondary | ICD-10-CM | POA: Diagnosis not present

## 2018-10-02 DIAGNOSIS — Z23 Encounter for immunization: Secondary | ICD-10-CM | POA: Diagnosis not present

## 2018-10-02 DIAGNOSIS — N186 End stage renal disease: Secondary | ICD-10-CM | POA: Diagnosis not present

## 2018-10-02 DIAGNOSIS — D689 Coagulation defect, unspecified: Secondary | ICD-10-CM | POA: Diagnosis not present

## 2018-10-02 DIAGNOSIS — N2581 Secondary hyperparathyroidism of renal origin: Secondary | ICD-10-CM | POA: Diagnosis not present

## 2018-10-04 ENCOUNTER — Encounter: Payer: Medicare Other | Admitting: Occupational Therapy

## 2018-10-04 ENCOUNTER — Telehealth: Payer: Self-pay

## 2018-10-04 ENCOUNTER — Ambulatory Visit: Payer: Medicare Other | Admitting: Physical Therapy

## 2018-10-04 ENCOUNTER — Encounter: Payer: Medicare Other | Admitting: Speech Pathology

## 2018-10-04 NOTE — Telephone Encounter (Signed)
Pt. Called and states his Diarrhea has not slowed down at all. He did not start the Vanco until Sat. Evening because the Pharmacy had to order it. He took 2 doses on Sat. And 4 doses on Sun. He has no fever and denies and blood or mucousy stools. Stated they are total liquid. Please advise

## 2018-10-05 DIAGNOSIS — Z23 Encounter for immunization: Secondary | ICD-10-CM | POA: Diagnosis not present

## 2018-10-05 DIAGNOSIS — I129 Hypertensive chronic kidney disease with stage 1 through stage 4 chronic kidney disease, or unspecified chronic kidney disease: Secondary | ICD-10-CM | POA: Diagnosis not present

## 2018-10-05 DIAGNOSIS — Z992 Dependence on renal dialysis: Secondary | ICD-10-CM | POA: Diagnosis not present

## 2018-10-05 DIAGNOSIS — R197 Diarrhea, unspecified: Secondary | ICD-10-CM | POA: Diagnosis not present

## 2018-10-05 DIAGNOSIS — D689 Coagulation defect, unspecified: Secondary | ICD-10-CM | POA: Diagnosis not present

## 2018-10-05 DIAGNOSIS — N2581 Secondary hyperparathyroidism of renal origin: Secondary | ICD-10-CM | POA: Diagnosis not present

## 2018-10-05 DIAGNOSIS — N186 End stage renal disease: Secondary | ICD-10-CM | POA: Diagnosis not present

## 2018-10-05 MED ORDER — DIPHENOXYLATE-ATROPINE 2.5-0.025 MG PO TABS: 2.0000 | ORAL_TABLET | Freq: Four times a day (QID) | ORAL | 1 refills | Status: DC | PRN

## 2018-10-05 NOTE — Telephone Encounter (Signed)
Please complete the vancomycin course.  If this is C diff, it will take some time to see improvement.  Find out from him and/or his wife if he is still taking ferric citrate.  If so, stop it.  Has he been taking the lomotil (diphenoxylate/atropine) 2 tablets four times daily?  Please see what you can find out and let me know later today.

## 2018-10-05 NOTE — Telephone Encounter (Signed)
Spoke to pt. And wife. Stools are still liquid but  Have changed to chocolate color since starting the Vancomycin. He has had 5-6 liquid stools in the past 24 hrs. Pt. Has not been taking the Ferric Citrate and just started back on the Lomotil yesterday. Told pt. The Vanco might take a little longer to stow down the diarrhea.

## 2018-10-05 NOTE — Telephone Encounter (Signed)
Lomotil 2 tablets/capsules up to 4 times daily for diarrhea.  I will send a new prescription so he has enough.  Please check in with him midday Thursday for an update (since we are closed Friday)

## 2018-10-06 ENCOUNTER — Other Ambulatory Visit: Payer: Self-pay | Admitting: Gastroenterology

## 2018-10-06 ENCOUNTER — Ambulatory Visit: Payer: Medicare Other | Admitting: Rehabilitative and Restorative Service Providers"

## 2018-10-06 ENCOUNTER — Ambulatory Visit: Payer: Medicare Other | Admitting: Occupational Therapy

## 2018-10-07 DIAGNOSIS — D689 Coagulation defect, unspecified: Secondary | ICD-10-CM | POA: Diagnosis not present

## 2018-10-07 DIAGNOSIS — D631 Anemia in chronic kidney disease: Secondary | ICD-10-CM | POA: Diagnosis not present

## 2018-10-07 DIAGNOSIS — L299 Pruritus, unspecified: Secondary | ICD-10-CM | POA: Diagnosis not present

## 2018-10-07 DIAGNOSIS — R197 Diarrhea, unspecified: Secondary | ICD-10-CM | POA: Diagnosis not present

## 2018-10-07 DIAGNOSIS — N2581 Secondary hyperparathyroidism of renal origin: Secondary | ICD-10-CM | POA: Diagnosis not present

## 2018-10-07 DIAGNOSIS — N186 End stage renal disease: Secondary | ICD-10-CM | POA: Diagnosis not present

## 2018-10-07 DIAGNOSIS — E1121 Type 2 diabetes mellitus with diabetic nephropathy: Secondary | ICD-10-CM | POA: Diagnosis not present

## 2018-10-08 NOTE — Telephone Encounter (Signed)
Pt reports he is taking the vanco and the lomotil. He states stools are still brown in color and are still loose. He knows to call back if he continues to have diarrhea after finishing the antibiotic.

## 2018-10-09 DIAGNOSIS — E1121 Type 2 diabetes mellitus with diabetic nephropathy: Secondary | ICD-10-CM | POA: Diagnosis not present

## 2018-10-09 DIAGNOSIS — N186 End stage renal disease: Secondary | ICD-10-CM | POA: Diagnosis not present

## 2018-10-09 DIAGNOSIS — D689 Coagulation defect, unspecified: Secondary | ICD-10-CM | POA: Diagnosis not present

## 2018-10-09 DIAGNOSIS — N2581 Secondary hyperparathyroidism of renal origin: Secondary | ICD-10-CM | POA: Diagnosis not present

## 2018-10-09 DIAGNOSIS — D631 Anemia in chronic kidney disease: Secondary | ICD-10-CM | POA: Diagnosis not present

## 2018-10-09 DIAGNOSIS — L299 Pruritus, unspecified: Secondary | ICD-10-CM | POA: Diagnosis not present

## 2018-10-09 DIAGNOSIS — R197 Diarrhea, unspecified: Secondary | ICD-10-CM | POA: Diagnosis not present

## 2018-10-12 DIAGNOSIS — L299 Pruritus, unspecified: Secondary | ICD-10-CM | POA: Diagnosis not present

## 2018-10-12 DIAGNOSIS — N186 End stage renal disease: Secondary | ICD-10-CM | POA: Diagnosis not present

## 2018-10-12 DIAGNOSIS — D689 Coagulation defect, unspecified: Secondary | ICD-10-CM | POA: Diagnosis not present

## 2018-10-12 DIAGNOSIS — N2581 Secondary hyperparathyroidism of renal origin: Secondary | ICD-10-CM | POA: Diagnosis not present

## 2018-10-12 DIAGNOSIS — D631 Anemia in chronic kidney disease: Secondary | ICD-10-CM | POA: Diagnosis not present

## 2018-10-12 DIAGNOSIS — E1121 Type 2 diabetes mellitus with diabetic nephropathy: Secondary | ICD-10-CM | POA: Diagnosis not present

## 2018-10-12 DIAGNOSIS — R197 Diarrhea, unspecified: Secondary | ICD-10-CM | POA: Diagnosis not present

## 2018-10-13 ENCOUNTER — Other Ambulatory Visit: Payer: Medicare Other

## 2018-10-13 ENCOUNTER — Other Ambulatory Visit: Payer: Self-pay

## 2018-10-13 ENCOUNTER — Ambulatory Visit: Payer: Medicare Other | Admitting: Physical Therapy

## 2018-10-13 ENCOUNTER — Telehealth: Payer: Self-pay

## 2018-10-13 DIAGNOSIS — K529 Noninfective gastroenteritis and colitis, unspecified: Secondary | ICD-10-CM

## 2018-10-13 NOTE — Telephone Encounter (Signed)
Spoke to pts wife, she cleared up the medication directions. She states that he has completed the creon samples and no improvement was seen in his diarrhea. He has also completed the Vancomycin. His diarrhea while he was on that had improved some. He is using Lomotil 2 tabs 4 x a day and still having watery urgent stools.

## 2018-10-13 NOTE — Addendum Note (Signed)
Addended by: Trenda Moots on: 12/07/4467 12:50 PM   Modules accepted: Orders

## 2018-10-13 NOTE — Telephone Encounter (Signed)
C diff toxin and PCR ordered. Patient and his wife are aware.

## 2018-10-13 NOTE — Telephone Encounter (Signed)
Please recheck C.diff to exclude recurrent infection.

## 2018-10-13 NOTE — Telephone Encounter (Addendum)
Called and spoke to Jason [name]. He states he has completed the Vancomycin a week ago. He is still having urgent loose/watery stools. He is taking Lomotil 2 tabs 3-4 x a day. He also indicates he is taking Creon, but his is unclear as he was given samples of only a count of 36. Which would have only lasted about a week. Please advise.

## 2018-10-14 DIAGNOSIS — N2581 Secondary hyperparathyroidism of renal origin: Secondary | ICD-10-CM | POA: Diagnosis not present

## 2018-10-14 DIAGNOSIS — L299 Pruritus, unspecified: Secondary | ICD-10-CM | POA: Diagnosis not present

## 2018-10-14 DIAGNOSIS — R197 Diarrhea, unspecified: Secondary | ICD-10-CM | POA: Diagnosis not present

## 2018-10-14 DIAGNOSIS — D631 Anemia in chronic kidney disease: Secondary | ICD-10-CM | POA: Diagnosis not present

## 2018-10-14 DIAGNOSIS — D689 Coagulation defect, unspecified: Secondary | ICD-10-CM | POA: Diagnosis not present

## 2018-10-14 DIAGNOSIS — E1121 Type 2 diabetes mellitus with diabetic nephropathy: Secondary | ICD-10-CM | POA: Diagnosis not present

## 2018-10-14 DIAGNOSIS — N186 End stage renal disease: Secondary | ICD-10-CM | POA: Diagnosis not present

## 2018-10-14 LAB — CLOSTRIDIUM DIFFICILE TOXIN B, QUALITATIVE, REAL-TIME PCR: Toxigenic C. Difficile by PCR: NOT DETECTED

## 2018-10-16 DIAGNOSIS — N186 End stage renal disease: Secondary | ICD-10-CM | POA: Diagnosis not present

## 2018-10-16 DIAGNOSIS — E1121 Type 2 diabetes mellitus with diabetic nephropathy: Secondary | ICD-10-CM | POA: Diagnosis not present

## 2018-10-16 DIAGNOSIS — R197 Diarrhea, unspecified: Secondary | ICD-10-CM | POA: Diagnosis not present

## 2018-10-16 DIAGNOSIS — L299 Pruritus, unspecified: Secondary | ICD-10-CM | POA: Diagnosis not present

## 2018-10-16 DIAGNOSIS — N2581 Secondary hyperparathyroidism of renal origin: Secondary | ICD-10-CM | POA: Diagnosis not present

## 2018-10-16 DIAGNOSIS — D689 Coagulation defect, unspecified: Secondary | ICD-10-CM | POA: Diagnosis not present

## 2018-10-16 DIAGNOSIS — D631 Anemia in chronic kidney disease: Secondary | ICD-10-CM | POA: Diagnosis not present

## 2018-10-18 ENCOUNTER — Ambulatory Visit: Payer: Medicare Other | Admitting: Podiatry

## 2018-10-18 ENCOUNTER — Encounter: Payer: Self-pay | Admitting: Podiatry

## 2018-10-18 ENCOUNTER — Other Ambulatory Visit: Payer: Self-pay

## 2018-10-18 ENCOUNTER — Encounter: Payer: Medicare Other | Admitting: Occupational Therapy

## 2018-10-18 ENCOUNTER — Encounter: Payer: Medicare Other | Admitting: Speech Pathology

## 2018-10-18 ENCOUNTER — Other Ambulatory Visit: Payer: Self-pay | Admitting: Gastroenterology

## 2018-10-18 VITALS — Temp 97.9°F

## 2018-10-18 DIAGNOSIS — R197 Diarrhea, unspecified: Secondary | ICD-10-CM

## 2018-10-18 DIAGNOSIS — G629 Polyneuropathy, unspecified: Secondary | ICD-10-CM

## 2018-10-18 DIAGNOSIS — M79674 Pain in right toe(s): Secondary | ICD-10-CM | POA: Diagnosis not present

## 2018-10-18 DIAGNOSIS — B351 Tinea unguium: Secondary | ICD-10-CM

## 2018-10-18 DIAGNOSIS — M79675 Pain in left toe(s): Secondary | ICD-10-CM

## 2018-10-18 DIAGNOSIS — D689 Coagulation defect, unspecified: Secondary | ICD-10-CM

## 2018-10-18 MED ORDER — BUDESONIDE 3 MG PO CPEP: 3.0000 mg | ORAL_CAPSULE | Freq: Every day | ORAL | 1 refills | Status: DC

## 2018-10-18 MED ORDER — BUDESONIDE 3 MG PO CPEP: 9.0000 mg | ORAL_CAPSULE | Freq: Every day | ORAL | 1 refills | Status: DC

## 2018-10-18 MED ORDER — GABAPENTIN 300 MG PO CAPS: ORAL_CAPSULE | ORAL | 0 refills | Status: DC

## 2018-10-18 NOTE — Progress Notes (Signed)
Subjective:   Patient ID: Jason Cotta Sr., male   DOB: 73 y.o.   MRN: [id number]   HPI Patient presents stating my nails are really bothering me and I have nerve problems and need more medication   ROS      Objective:  Physical Exam  Neurovascular status on changed with patient found to have thick yellow brittle nailbeds 1-5 both feet history of neuropathy with diabetes and no drainage noted      Assessment:  Chronic mycotic nail infection 1-5 both feet with neuropathic symptoms present     Plan:  H&P reviewed condition debrided nailbeds 1-5 both feet with no iatrogenic bleeding and discussed his concerns about neuropathy and at this point we will continue gabapentin which I educated him on today and patient will be seen back to recheck again in several months and will have routine nail care done and can have the neuropathy checked and I also want him to let family physician know he is on this medicine and they will take over after a few months of treatment.  Reappoint to recheck

## 2018-10-19 ENCOUNTER — Other Ambulatory Visit (INDEPENDENT_AMBULATORY_CARE_PROVIDER_SITE_OTHER): Payer: Medicare Other

## 2018-10-19 DIAGNOSIS — R197 Diarrhea, unspecified: Secondary | ICD-10-CM | POA: Diagnosis not present

## 2018-10-19 DIAGNOSIS — D689 Coagulation defect, unspecified: Secondary | ICD-10-CM | POA: Diagnosis not present

## 2018-10-19 DIAGNOSIS — D631 Anemia in chronic kidney disease: Secondary | ICD-10-CM | POA: Diagnosis not present

## 2018-10-19 DIAGNOSIS — E1121 Type 2 diabetes mellitus with diabetic nephropathy: Secondary | ICD-10-CM | POA: Diagnosis not present

## 2018-10-19 DIAGNOSIS — N2581 Secondary hyperparathyroidism of renal origin: Secondary | ICD-10-CM | POA: Diagnosis not present

## 2018-10-19 DIAGNOSIS — L299 Pruritus, unspecified: Secondary | ICD-10-CM | POA: Diagnosis not present

## 2018-10-19 DIAGNOSIS — N186 End stage renal disease: Secondary | ICD-10-CM | POA: Diagnosis not present

## 2018-10-19 LAB — TSH: TSH: 1.83 u[IU]/mL (ref 0.35–4.50)

## 2018-10-19 LAB — T4, FREE: Free T4: 0.93 ng/dL (ref 0.60–1.60)

## 2018-10-21 ENCOUNTER — Ambulatory Visit: Payer: Medicare Other | Admitting: Rehabilitative and Restorative Service Providers"

## 2018-10-21 ENCOUNTER — Encounter: Payer: Medicare Other | Admitting: Speech Pathology

## 2018-10-21 ENCOUNTER — Encounter: Payer: Medicare Other | Admitting: Occupational Therapy

## 2018-10-21 DIAGNOSIS — N186 End stage renal disease: Secondary | ICD-10-CM | POA: Diagnosis not present

## 2018-10-21 DIAGNOSIS — N2581 Secondary hyperparathyroidism of renal origin: Secondary | ICD-10-CM | POA: Diagnosis not present

## 2018-10-21 DIAGNOSIS — D631 Anemia in chronic kidney disease: Secondary | ICD-10-CM | POA: Diagnosis not present

## 2018-10-21 DIAGNOSIS — D689 Coagulation defect, unspecified: Secondary | ICD-10-CM | POA: Diagnosis not present

## 2018-10-21 DIAGNOSIS — L299 Pruritus, unspecified: Secondary | ICD-10-CM | POA: Diagnosis not present

## 2018-10-21 DIAGNOSIS — R197 Diarrhea, unspecified: Secondary | ICD-10-CM | POA: Diagnosis not present

## 2018-10-21 DIAGNOSIS — E1121 Type 2 diabetes mellitus with diabetic nephropathy: Secondary | ICD-10-CM | POA: Diagnosis not present

## 2018-10-23 DIAGNOSIS — E1121 Type 2 diabetes mellitus with diabetic nephropathy: Secondary | ICD-10-CM | POA: Diagnosis not present

## 2018-10-23 DIAGNOSIS — N186 End stage renal disease: Secondary | ICD-10-CM | POA: Diagnosis not present

## 2018-10-23 DIAGNOSIS — D689 Coagulation defect, unspecified: Secondary | ICD-10-CM | POA: Diagnosis not present

## 2018-10-23 DIAGNOSIS — D631 Anemia in chronic kidney disease: Secondary | ICD-10-CM | POA: Diagnosis not present

## 2018-10-23 DIAGNOSIS — L299 Pruritus, unspecified: Secondary | ICD-10-CM | POA: Diagnosis not present

## 2018-10-23 DIAGNOSIS — N2581 Secondary hyperparathyroidism of renal origin: Secondary | ICD-10-CM | POA: Diagnosis not present

## 2018-10-23 DIAGNOSIS — R197 Diarrhea, unspecified: Secondary | ICD-10-CM | POA: Diagnosis not present

## 2018-10-25 ENCOUNTER — Ambulatory Visit: Payer: Medicare Other | Admitting: Rehabilitative and Restorative Service Providers"

## 2018-10-25 ENCOUNTER — Encounter: Payer: Medicare Other | Admitting: Speech Pathology

## 2018-10-25 ENCOUNTER — Encounter: Payer: Medicare Other | Admitting: Occupational Therapy

## 2018-10-26 DIAGNOSIS — D631 Anemia in chronic kidney disease: Secondary | ICD-10-CM | POA: Diagnosis not present

## 2018-10-26 DIAGNOSIS — D689 Coagulation defect, unspecified: Secondary | ICD-10-CM | POA: Diagnosis not present

## 2018-10-26 DIAGNOSIS — N2581 Secondary hyperparathyroidism of renal origin: Secondary | ICD-10-CM | POA: Diagnosis not present

## 2018-10-26 DIAGNOSIS — N186 End stage renal disease: Secondary | ICD-10-CM | POA: Diagnosis not present

## 2018-10-26 DIAGNOSIS — L299 Pruritus, unspecified: Secondary | ICD-10-CM | POA: Diagnosis not present

## 2018-10-26 DIAGNOSIS — E1121 Type 2 diabetes mellitus with diabetic nephropathy: Secondary | ICD-10-CM | POA: Diagnosis not present

## 2018-10-26 DIAGNOSIS — R197 Diarrhea, unspecified: Secondary | ICD-10-CM | POA: Diagnosis not present

## 2018-10-28 ENCOUNTER — Encounter: Payer: Medicare Other | Admitting: Speech Pathology

## 2018-10-28 ENCOUNTER — Ambulatory Visit: Payer: Medicare Other | Admitting: Rehabilitative and Restorative Service Providers"

## 2018-10-28 ENCOUNTER — Encounter: Payer: Medicare Other | Admitting: Occupational Therapy

## 2018-10-28 DIAGNOSIS — E1121 Type 2 diabetes mellitus with diabetic nephropathy: Secondary | ICD-10-CM | POA: Diagnosis not present

## 2018-10-28 DIAGNOSIS — N186 End stage renal disease: Secondary | ICD-10-CM | POA: Diagnosis not present

## 2018-10-28 DIAGNOSIS — D689 Coagulation defect, unspecified: Secondary | ICD-10-CM | POA: Diagnosis not present

## 2018-10-28 DIAGNOSIS — L299 Pruritus, unspecified: Secondary | ICD-10-CM | POA: Diagnosis not present

## 2018-10-28 DIAGNOSIS — N2581 Secondary hyperparathyroidism of renal origin: Secondary | ICD-10-CM | POA: Diagnosis not present

## 2018-10-28 DIAGNOSIS — R197 Diarrhea, unspecified: Secondary | ICD-10-CM | POA: Diagnosis not present

## 2018-10-28 DIAGNOSIS — D631 Anemia in chronic kidney disease: Secondary | ICD-10-CM | POA: Diagnosis not present

## 2018-10-30 DIAGNOSIS — D689 Coagulation defect, unspecified: Secondary | ICD-10-CM | POA: Diagnosis not present

## 2018-10-30 DIAGNOSIS — D631 Anemia in chronic kidney disease: Secondary | ICD-10-CM | POA: Diagnosis not present

## 2018-10-30 DIAGNOSIS — E1121 Type 2 diabetes mellitus with diabetic nephropathy: Secondary | ICD-10-CM | POA: Diagnosis not present

## 2018-10-30 DIAGNOSIS — N186 End stage renal disease: Secondary | ICD-10-CM | POA: Diagnosis not present

## 2018-10-30 DIAGNOSIS — R197 Diarrhea, unspecified: Secondary | ICD-10-CM | POA: Diagnosis not present

## 2018-10-30 DIAGNOSIS — N2581 Secondary hyperparathyroidism of renal origin: Secondary | ICD-10-CM | POA: Diagnosis not present

## 2018-10-30 DIAGNOSIS — L299 Pruritus, unspecified: Secondary | ICD-10-CM | POA: Diagnosis not present

## 2018-11-01 ENCOUNTER — Other Ambulatory Visit: Payer: Self-pay | Admitting: Internal Medicine

## 2018-11-02 ENCOUNTER — Other Ambulatory Visit: Payer: Self-pay | Admitting: Internal Medicine

## 2018-11-02 DIAGNOSIS — E1121 Type 2 diabetes mellitus with diabetic nephropathy: Secondary | ICD-10-CM | POA: Diagnosis not present

## 2018-11-02 DIAGNOSIS — R197 Diarrhea, unspecified: Secondary | ICD-10-CM | POA: Diagnosis not present

## 2018-11-02 DIAGNOSIS — N186 End stage renal disease: Secondary | ICD-10-CM | POA: Diagnosis not present

## 2018-11-02 DIAGNOSIS — D689 Coagulation defect, unspecified: Secondary | ICD-10-CM | POA: Diagnosis not present

## 2018-11-02 DIAGNOSIS — L299 Pruritus, unspecified: Secondary | ICD-10-CM | POA: Diagnosis not present

## 2018-11-02 DIAGNOSIS — N2581 Secondary hyperparathyroidism of renal origin: Secondary | ICD-10-CM | POA: Diagnosis not present

## 2018-11-02 DIAGNOSIS — D631 Anemia in chronic kidney disease: Secondary | ICD-10-CM | POA: Diagnosis not present

## 2018-11-04 DIAGNOSIS — Z992 Dependence on renal dialysis: Secondary | ICD-10-CM | POA: Diagnosis not present

## 2018-11-04 DIAGNOSIS — N2581 Secondary hyperparathyroidism of renal origin: Secondary | ICD-10-CM | POA: Diagnosis not present

## 2018-11-04 DIAGNOSIS — R197 Diarrhea, unspecified: Secondary | ICD-10-CM | POA: Diagnosis not present

## 2018-11-04 DIAGNOSIS — D631 Anemia in chronic kidney disease: Secondary | ICD-10-CM | POA: Diagnosis not present

## 2018-11-04 DIAGNOSIS — N186 End stage renal disease: Secondary | ICD-10-CM | POA: Diagnosis not present

## 2018-11-04 DIAGNOSIS — E1121 Type 2 diabetes mellitus with diabetic nephropathy: Secondary | ICD-10-CM | POA: Diagnosis not present

## 2018-11-04 DIAGNOSIS — D689 Coagulation defect, unspecified: Secondary | ICD-10-CM | POA: Diagnosis not present

## 2018-11-04 DIAGNOSIS — L299 Pruritus, unspecified: Secondary | ICD-10-CM | POA: Diagnosis not present

## 2018-11-04 DIAGNOSIS — I129 Hypertensive chronic kidney disease with stage 1 through stage 4 chronic kidney disease, or unspecified chronic kidney disease: Secondary | ICD-10-CM | POA: Diagnosis not present

## 2018-11-05 ENCOUNTER — Telehealth: Payer: Self-pay | Admitting: Gastroenterology

## 2018-11-05 NOTE — Telephone Encounter (Signed)
[phone] patient is calling back.

## 2018-11-05 NOTE — Telephone Encounter (Signed)
Pt requested a refill for budesonide and also asked if we have any samples; pt stated that the drug is too expensive.

## 2018-11-05 NOTE — Telephone Encounter (Signed)
Patient states he has been taking budesonide for 2 weeks and has not seen a change in his symptoms. Still having urgent loose watery stools and wanted to know if there is another medication that can help him. He also states this medication is unaffordable. Please advise Dr. Loletha Carrow.

## 2018-11-05 NOTE — Telephone Encounter (Signed)
[name] states her and her husband cannot afford the prescription for 90 capsules and wants to know if we can send in 30 capsules until he can afford the the 60 capsules. I informed patient's wife I cannot send in a partial prescription if he is taking 3 capsules a day, I have to send in #90. Also informed her we do not carry samples of this medication in the office. Informed patient I can send message to Dr. Loletha Carrow and ask to decrease the dose if this is cheaper of her husband but would not recommend of the medication is working well. [name] states she will discuss this with her husband and call us back.

## 2018-11-06 DIAGNOSIS — N186 End stage renal disease: Secondary | ICD-10-CM | POA: Diagnosis not present

## 2018-11-06 DIAGNOSIS — E1121 Type 2 diabetes mellitus with diabetic nephropathy: Secondary | ICD-10-CM | POA: Diagnosis not present

## 2018-11-06 DIAGNOSIS — D689 Coagulation defect, unspecified: Secondary | ICD-10-CM | POA: Diagnosis not present

## 2018-11-06 DIAGNOSIS — N2581 Secondary hyperparathyroidism of renal origin: Secondary | ICD-10-CM | POA: Diagnosis not present

## 2018-11-06 DIAGNOSIS — R197 Diarrhea, unspecified: Secondary | ICD-10-CM | POA: Diagnosis not present

## 2018-11-08 ENCOUNTER — Telehealth: Payer: Self-pay

## 2018-11-08 NOTE — Telephone Encounter (Signed)
Stop the budesonide if not working.  I have not spoken to him since the colonoscopy, and need to do so because it has been very difficult to determine what is causing this diarrhea.  Please ask if he would set up a video conference visit with me this week.

## 2018-11-08 NOTE — Telephone Encounter (Signed)
Follow up scheduled for a ZOOM visit on 11-10-2018

## 2018-11-08 NOTE — Telephone Encounter (Signed)
Scheduled for 11-10-2018 @ 130pm. He will talk with his grandson about getting set up for zoom. His grandson Diplomatic Services operational officer) will help them. His email address is [email or web address]

## 2018-11-08 NOTE — Telephone Encounter (Signed)
Pt and wife aware to stop the budesonide if its not working.

## 2018-11-08 NOTE — Telephone Encounter (Signed)
Left message to return call 

## 2018-11-09 DIAGNOSIS — E1121 Type 2 diabetes mellitus with diabetic nephropathy: Secondary | ICD-10-CM | POA: Diagnosis not present

## 2018-11-09 DIAGNOSIS — N186 End stage renal disease: Secondary | ICD-10-CM | POA: Diagnosis not present

## 2018-11-09 DIAGNOSIS — D689 Coagulation defect, unspecified: Secondary | ICD-10-CM | POA: Diagnosis not present

## 2018-11-09 DIAGNOSIS — R197 Diarrhea, unspecified: Secondary | ICD-10-CM | POA: Diagnosis not present

## 2018-11-09 DIAGNOSIS — N2581 Secondary hyperparathyroidism of renal origin: Secondary | ICD-10-CM | POA: Diagnosis not present

## 2018-11-10 ENCOUNTER — Other Ambulatory Visit: Payer: Self-pay

## 2018-11-10 ENCOUNTER — Ambulatory Visit (INDEPENDENT_AMBULATORY_CARE_PROVIDER_SITE_OTHER): Payer: Medicare Other | Admitting: Gastroenterology

## 2018-11-10 ENCOUNTER — Encounter: Payer: Self-pay | Admitting: Gastroenterology

## 2018-11-10 VITALS — Ht 68.0 in | Wt 164.0 lb

## 2018-11-10 DIAGNOSIS — K529 Noninfective gastroenteritis and colitis, unspecified: Secondary | ICD-10-CM

## 2018-11-10 DIAGNOSIS — R634 Abnormal weight loss: Secondary | ICD-10-CM

## 2018-11-10 NOTE — Progress Notes (Addendum)
This patient contacted our office requesting a physician telemedicine video consultation regarding clinical questions and/or test results.   Participants on the conference : myself and patient, and his wife  The patient consented to phone consultation and was aware that a charge will be placed through their insurance.  I was in my office and the patient was at home   Encounter time:  Total time 40 minutes, with 26 minutes spent with patient on Doximity   Wilfrid Lund, MD   _____________________________________________________________________________________________               [facility] Progress Note  Chief Complaint: Chronic diarrhea  Subjective  History:  Jason [name] has continued to have diarrhea, with 3-4 watery stools per day with urgency.  He had negative stool infectious tests, colonoscopy with biopsies (no microscopic colitis), colon polyp discovered that could not be removed because he was on Brilinta.  He will be recalled at a later date to remove that polyp when he can be off Brilinta for several days. Upper endoscopy was done at the same time, no biopsies of the small bowel since it was visibly normal, also concerns regarding challenging sedation for the procedure and risks of bleeding because of the Brilinta. He has not improved with the following: Imodium, Lomotil, empiric treatment courses of metronidazole and subsequently vancomycin (history of C. Difficile), several days of Creon samples, and most recently a 14-day course of budesonide (out of concern that microscopic colitis may have been patchy and therefore not picked up on biopsies).  This all began during or very shortly after his specialization in December 2019 when he presented with MCA stroke and had carotid artery plaque removal and stent placement.  His chronic kidney disease decompensated he began dialysis at that time.  I have extensively reviewed admission history and physical as well as discharge  summaries around that time to discover if there were any other changes in medicines that may have precipitated this diarrhea. _______________________  I spoke extensively with Jason [name] and his wife today.  They are certain in their recollections that this diarrhea began during the December hospitalization and has continued in the same manner since then.  When I asked him to think back to last Thanksgiving, they are certain that he was absolutely not having diarrhea at that time.  He has several loose nonbloody stools per day.  They are poorly formed, light brown in color, he has no abdominal pain.  He might have to get up a couple times during the night to have a bowel movement as well.  He has not had any episodes of incontinence, including overnight.  ROS: Cardiovascular:  no chest pain Respiratory: no dyspnea He believes his most recent weight at dialysis was 67 kg, down from 71 kg at his visit with me on 09/03/2018. The patient's Past Medical, Family and Social History were reviewed and are on file in the EMR.   Objective:  Med list reviewed   Current Outpatient Medications:    acetaminophen (TYLENOL) 325 MG tablet, Take 1-2 tablets (325-650 mg total) by mouth every 4 (four) hours as needed for mild pain., Disp: , Rfl:    allopurinol (ZYLOPRIM) 100 MG tablet, Take 1 tablet by mouth once daily, Disp: 90 tablet, Rfl: 1   apixaban (ELIQUIS) 2.5 MG TABS tablet, Take 1 tablet (2.5 mg total) by mouth 2 (two) times daily., Disp: 180 tablet, Rfl: 0   atorvastatin (LIPITOR) 20 MG tablet, TAKE 1 TABLET BY MOUTH  DAILY AT 6  PM. (Patient taking differently: Take 20 mg by mouth daily at 6 PM. ), Disp: 90 tablet, Rfl: 1   diphenoxylate-atropine (LOMOTIL) 2.5-0.025 MG tablet, Take 2 tablets by mouth 4 (four) times daily as needed for diarrhea or loose stools., Disp: 240 tablet, Rfl: 1   ferric citrate (AURYXIA) 1 GM 210 MG(Fe) tablet, Take 420 mg by mouth 3 (three) times daily with meals., Disp: , Rfl:     gabapentin (NEURONTIN) 300 MG capsule, TAKE 1 CAPSULE BY MOUTH EVERY DAY AT BEDTIME. PT NEEDS APPT PRIOR TO FUTURE REFILLS, Disp: 30 capsule, Rfl: 0   metoprolol succinate (TOPROL-XL) 25 MG 24 hr tablet, Take 1 tablet (25 mg total) by mouth daily., Disp: 90 tablet, Rfl: 0   multivitamin (RENA-VIT) TABS tablet, TAKE 1 TABLET BY MOUTH AT BEDTIME, Disp: 90 each, Rfl: 1   budesonide (ENTOCORT EC) 3 MG 24 hr capsule, Take 3 capsules (9 mg total) by mouth daily., Disp: 90 capsule, Rfl: 1   calcitRIOL (ROCALTROL) 0.25 MCG capsule, TAKE 1 CAPSULE BY MOUTH ONCE DAILY (Patient not taking: Reported on 11/10/2018), Disp: 30 capsule, Rfl: 5   Cholecalciferol (VITAMIN D-3) 125 MCG (5000 UT) TABS, Take 5,000 Units by mouth daily., Disp: , Rfl:    lipase/protease/amylase (CREON) 36000 UNITS CPEP capsule, Please take 2 with meals and one with meals., Disp: 36 capsule, Rfl: 0   polycarbophil (FIBERCON) 625 MG tablet, Take 2 tablets (1,250 mg total) by mouth 2 (two) times daily. (Patient not taking: Reported on 11/10/2018), Disp: , Rfl:     No exam - virtual visit   @ASSESSMENTPLANBEGIN @ Assessment: Encounter Diagnoses  Name Primary?   Chronic diarrhea Yes   Weight loss    Overall, this very challenging and worrisome scenario seems to suggest malabsorption because there is ongoing weight loss despite reported good appetite and food intake.  It came on much too abruptly to be exocrine pancreatic insufficiency, and I would have expected even some improvement on just several days of medicine for that condition.  Since it started immediately with dialysis, it does not seem he could have developed something unusual like secondary amyloidosis.  It sounds as if it came on too abruptly to be small bowel bacterial overgrowth, he also does not have risk factors for that.  He also did not improve at all on metronidazole, which I was giving him as empiric therapy for possible C. difficile before the colonoscopy was  performed.  The only other medicine that appears new and could be a potential culprit is his phosphate binder, ferric citrate, which appears to have been started with the onset of dialysis in December. While diarrhea is reportedly a common side effect from this, I do not think I have seen these medicines because a malabsorptive type picture.  Curiously, the only day he recalls not having diarrhea was the day of and after his colonoscopy, when he was eating very little and not taking many of his usual medicines.  The ongoing weight loss is particularly worrisome.  Plan I will speak with his nephrologist, Dr.Jay Posey Pronto, as soon as I can reach him discussed the possibility of a short period of time off this particular phosphate binder if that is considered reasonable and safe to do.  I do not know of any other way that we will be able to factor that out of the list of possible culprits.  His extensive work-up and also empiric therapies have been unsuccessful.  If that is possible, but does not improve his  diarrhea, I will probably try him on Viberzi if there are no clear contraindications, since we need some way to slow down his intestinal transit time to decrease fluid and nutrient loss.   Jason [name]

## 2018-11-11 ENCOUNTER — Telehealth: Payer: Self-pay | Admitting: Gastroenterology

## 2018-11-11 ENCOUNTER — Telehealth: Payer: Self-pay

## 2018-11-11 DIAGNOSIS — N186 End stage renal disease: Secondary | ICD-10-CM | POA: Diagnosis not present

## 2018-11-11 DIAGNOSIS — E1121 Type 2 diabetes mellitus with diabetic nephropathy: Secondary | ICD-10-CM | POA: Diagnosis not present

## 2018-11-11 DIAGNOSIS — N2581 Secondary hyperparathyroidism of renal origin: Secondary | ICD-10-CM | POA: Diagnosis not present

## 2018-11-11 DIAGNOSIS — D689 Coagulation defect, unspecified: Secondary | ICD-10-CM | POA: Diagnosis not present

## 2018-11-11 DIAGNOSIS — R197 Diarrhea, unspecified: Secondary | ICD-10-CM | POA: Diagnosis not present

## 2018-11-11 NOTE — Telephone Encounter (Signed)
Pt's wife and would like linked sent to grandsons email [email or web address].

## 2018-11-11 NOTE — Telephone Encounter (Signed)
Please call Jason [name] or his wife and let them know that I spoke with his provider at [facility] regarding his chronic diarrhea and my hope that we could temporarily stop the Auryxia (ferric citrate) for 5 to 7 days to see if there is any improvement in his diarrhea.Anice Paganini, PA (working with Dr. Joelyn Oms, the physician overseeing dialysis patients) that would be okay, especially since [name]'s phosphate levels have been low recently.  They will check his phosphate level at dialysis late next week.  I would like to hear from [facility] next Wednesday or Thursday with an update on whether the diarrhea was changed at all with this temporary medicine change.

## 2018-11-11 NOTE — Telephone Encounter (Signed)
Doxy link will be sent to pts grandson for visit on 11/15/2018.

## 2018-11-11 NOTE — Telephone Encounter (Signed)
Spoke with pts wife and she is aware. They will call back on Wed or Thurs next week with an update.

## 2018-11-11 NOTE — Telephone Encounter (Signed)
If pts grandson calls back we need his name is Nautica. We need his first and last name and his email address for his grandfathers video.   I called pts wife that he has a appt with JEssica NP on Monday at 745am. I stated due to COVID 19 we are doing video visit for safety reasons. I receive verbal consent from wife to do video and to file insurance. She stated her grandson Marijo Conception use his lap top for her husband video visit yesterday.Their cell phones do not have camera or video access. She will have her grandson nautica call us back with his email address for the video visit at 53pm.Also the wife stated he works full time and is in school. She has to make sure his schedule is open for the visit. I gave our number for grandson to call.

## 2018-11-13 DIAGNOSIS — N2581 Secondary hyperparathyroidism of renal origin: Secondary | ICD-10-CM | POA: Diagnosis not present

## 2018-11-13 DIAGNOSIS — R197 Diarrhea, unspecified: Secondary | ICD-10-CM | POA: Diagnosis not present

## 2018-11-13 DIAGNOSIS — E1121 Type 2 diabetes mellitus with diabetic nephropathy: Secondary | ICD-10-CM | POA: Diagnosis not present

## 2018-11-13 DIAGNOSIS — D689 Coagulation defect, unspecified: Secondary | ICD-10-CM | POA: Diagnosis not present

## 2018-11-13 DIAGNOSIS — N186 End stage renal disease: Secondary | ICD-10-CM | POA: Diagnosis not present

## 2018-11-15 ENCOUNTER — Ambulatory Visit (INDEPENDENT_AMBULATORY_CARE_PROVIDER_SITE_OTHER): Payer: Medicare Other | Admitting: Adult Health

## 2018-11-15 ENCOUNTER — Other Ambulatory Visit: Payer: Self-pay

## 2018-11-15 ENCOUNTER — Encounter: Payer: Self-pay | Admitting: Adult Health

## 2018-11-15 DIAGNOSIS — I69354 Hemiplegia and hemiparesis following cerebral infarction affecting left non-dominant side: Secondary | ICD-10-CM

## 2018-11-15 DIAGNOSIS — I480 Paroxysmal atrial fibrillation: Secondary | ICD-10-CM | POA: Diagnosis not present

## 2018-11-15 DIAGNOSIS — E785 Hyperlipidemia, unspecified: Secondary | ICD-10-CM | POA: Diagnosis not present

## 2018-11-15 DIAGNOSIS — I10 Essential (primary) hypertension: Secondary | ICD-10-CM

## 2018-11-15 DIAGNOSIS — G4733 Obstructive sleep apnea (adult) (pediatric): Secondary | ICD-10-CM

## 2018-11-15 DIAGNOSIS — I63511 Cerebral infarction due to unspecified occlusion or stenosis of right middle cerebral artery: Secondary | ICD-10-CM

## 2018-11-15 DIAGNOSIS — I69322 Dysarthria following cerebral infarction: Secondary | ICD-10-CM

## 2018-11-15 NOTE — Telephone Encounter (Addendum)
I spoke with wife that email was sent to her grandson for visit today at 245pm. She stated he is around the corner and will be here for the visit to do  on his lap top.I explain he needs to log on 5 10 minutes prior and find the email and click the link.She verbalized understanding.

## 2018-11-15 NOTE — Progress Notes (Signed)
[facility] [address] [place]. [place] [address] [phone]        FOLLOW UP NOTE  Mr. SAAGAR TORTORELLA Sr. Date of Birth:  08/16/1945 Medical Record Number:  [id number]   Reason for visit: Stroke follow up  Virtual Visit via Video Note  I connected with [name] on 11/15/18 at  2:45 PM EDT by a video enabled telemedicine application located remotely in my own home and verified that I am speaking with the correct person using two identifiers who was located at their own home and accompanied by his wife.   I discussed the limitations of evaluation and management by telemedicine and the availability of in person appointments. The patient expressed understanding and agreed to proceed.   HPI: DAKARI CREGGER Sr. is being followed in this office for right MCA infarct due to right ICA and MCA occlusion status post IR and rescue stents with TICI 2B reperfusion most likely due to large vessel arthrosclerosis but could not rule out cardioembolic source on 41/32/4401.  He initially had stroke follow-up office visit scheduled today but due to COVID-19 safety precautions, visit transition to telemedicine via [email or web address] with patient's consent.  Underlying medical conditions ESRD on HD, COPD, anemia, HTN, obesity, OSA on CPAP, dilated cardiomyopathy with decreased EF and history of rectal cancer.  He initially presented with slurred speech and left-sided weakness.  He participated in CIR and then continue home therapies at discharge for residual dysarthria and left hemiparesis. He continues to have some dysarthria with improvement and mild left hemiparesis.  At prior visit, recommended outpatient PT/OT/ST but due to financial reasons, he was only evaluated x1 but does continue to do exercises on his own at home. He is ambulating with RW without any recent falls.  Denies new or worsening stroke/TIA symptoms.  30-day cardiac event monitor ordered at prior visit with finding of atrial  fibrillation and therefore Eliquis initiated with discontinuing aspirin and Brilinta.  He denies any bleeding or bruising.   Continues on atorvastatin without side effects myalgias  Blood pressure has been stable  Continues HD T/TH/Sat       ROS:   14 system review of systems performed and negative with exception of speech difficulty, weakness, fatigue   PMH:  Past Medical History:  Diagnosis Date  . Anemia   . Arthritis   . Chronic renal insufficiency 07/31/2011  . COPD (chronic obstructive pulmonary disease) ([place])    pt denies  . CVA (cerebral vascular accident) ([place]) 06/2018  . Deficiency anemia 07/20/2012  . Diverticulosis   . Edema 09/21/2010   Qualifier: Diagnosis of  By: Jerold Coombe    . ERECTILE DYSFUNCTION, MILD 05/11/2007   Qualifier: Diagnosis of  By: Sherren Mocha MD, Jory Ee   . Exertional dyspnea 10/30/2015  . GOUT 05/11/2007   Qualifier: Diagnosis of  By: Sherren Mocha MD, Jory Ee   . Hereditary and idiopathic peripheral neuropathy 09/14/2014  . History of radiation therapy 02/21/13-03/31/13   rectum 50.4Gy total dose  . Hypersomnia 10/30/2015  . Hypertension   . Lung nodule 10/30/2015  . OAB (overactive bladder) 01/09/2016  . Obesity (BMI 30.0-34.9) 10/30/2015  . Obstructive sleep apnea 07/29/2010   HST 11/2015 AHI 54.  On autocpap  >> 10 cm   . Peripheral edema 09/19/2013  . rectal ca dx'd 02/01/13   rectal. Radiation and chemotherapy- remains on chemotherapy-next tx. 08-22-13.   . S/P partial lobectomy of lung 11/20/2015  . S/P thoracotomy   .  SUI (stress urinary incontinence), male 01/09/2016    PSH:  Past Surgical History:  Procedure Laterality Date  . A/V FISTULAGRAM Left 06/14/2018   Procedure: A/V FISTULAGRAM;  Surgeon: Waynetta Sandy, MD;  Location: [facility];  Service: Cardiovascular;  Laterality: Left;  . AV FISTULA PLACEMENT Left 03/24/2016   Procedure: CREATION OF LEFT BRACIOCEPHALIC ARTERIOVENOUS (AV) FISTULA;  Surgeon: Elam Dutch, MD;   Location: [facility];  Service: Vascular;  Laterality: Left;  . BIOPSY  09/17/2018   Procedure: BIOPSY;  Surgeon: Doran Stabler, MD;  Location: [facility];  Service: Gastroenterology;;  . COLON SURGERY  05/20/2013  . COLONOSCOPY  2010   [facility]  . COLONOSCOPY WITH PROPOFOL N/A 09/17/2018   Procedure: COLONOSCOPY WITH PROPOFOL;  Surgeon: Doran Stabler, MD;  Location: [facility];  Service: Gastroenterology;  Laterality: N/A;  . ESOPHAGOGASTRODUODENOSCOPY (EGD) WITH PROPOFOL N/A 09/17/2018   Procedure: ESOPHAGOGASTRODUODENOSCOPY (EGD) WITH PROPOFOL;  Surgeon: Doran Stabler, MD;  [facility];  Service: Gastroenterology;  Laterality: N/A;  . EUS N/A 02/03/2013   Procedure: LOWER ENDOSCOPIC ULTRASOUND (EUS);  Surgeon: Milus Banister, MD;  Location: [facility];  Service: Endoscopy;  Laterality: N/A;  . ILEOSTOMY CLOSURE N/A 08/24/2013   Procedure: CLOSURE OF LOOP ILEOSTOMY ;  Surgeon: Leighton Ruff, MD;  Location: [facility];  Service: General;  Laterality: N/A;  . IR ANGIO VERTEBRAL SEL SUBCLAVIAN INNOMINATE BILAT MOD SED  06/08/2018  . IR CT HEAD LTD  06/08/2018  . IR INTRAVSC STENT CERV CAROTID W/O EMB-PROT MOD SED INC ANGIO  06/08/2018  . IR PERCUTANEOUS ART THROMBECTOMY/INFUSION INTRACRANIAL INC DIAG ANGIO  06/08/2018  . LAPAROSCOPIC LOW ANTERIOR RESECTION N/A 05/20/2013   Procedure: LAPAROSCOPIC LOW ANTERIOR RESECTION WITH SPLENIC FLEXURE MOBILIZATION;  Surgeon: Leighton Ruff, MD;  Location: [facility];  Service: General;  Laterality: N/A;  . OSTOMY N/A 05/20/2013   Procedure: diverting OSTOMY;  Surgeon: Leighton Ruff, MD;  Location: [facility];  Service: General;  Laterality: N/A;  . PERIPHERAL VASCULAR INTERVENTION Left 06/14/2018   Procedure: PERIPHERAL VASCULAR INTERVENTION;  Surgeon: Waynetta Sandy, MD;  Location: [facility];  Service: Cardiovascular;  Laterality: Left;  AVF on the Left  . RADIOLOGY WITH ANESTHESIA N/A 06/08/2018   Procedure: IR WITH  ANESTHESIA;  Surgeon: Radiologist, Medication, MD;  Location: [facility];  Service: Radiology;  Laterality: N/A;  . VIDEO ASSISTED THORACOSCOPY (VATS)/WEDGE RESECTION Left 11/20/2015   Procedure: VIDEO ASSISTED THORACOSCOPY (VATS)/LUNG RESECTION;  Surgeon: [name], MD;  Location: [facility];  Service: Thoracic;  Laterality: Left;  Marland Kitchen VIDEO BRONCHOSCOPY N/A 11/20/2015   Procedure: VIDEO BRONCHOSCOPY;  Surgeon: [name], MD;  Location: [facility];  Service: Thoracic;  Laterality: N/A;    Social History:  Social History   Socioeconomic History  . Marital status: Married    Spouse name: [name]  . Number of children: 2  . Years of education: 55  . Highest education level: Not on file  Occupational History    Comment: retired Insurance account manager  Social Needs  . Financial resource strain: Not hard at all  . Food insecurity:    Worry: Never true    Inability: Never true  . Transportation needs:    Medical: No    Non-medical: No  Tobacco Use  . Smoking status: Former Smoker    Packs/day: 0.50    Years: 10.00    Pack years: 5.00    Types: Cigarettes    Last attempt  to quit: 07/07/1977    Years since quitting: 41.3  . Smokeless tobacco: Never Used  Substance and Sexual Activity  . Alcohol use: Yes    Alcohol/week: 0.0 standard drinks    Comment: drinks occasionally  . Drug use: No  . Sexual activity: Not on file  Lifestyle  . Physical activity:    Days per week: 6 days    Minutes per session: 30 min  . Stress: Not at all  Relationships  . Social connections:    Talks on phone: More than three times a week    Gets together: More than three times a week    Attends religious service: More than 4 times per year    Active member of club or organization: Yes    Attends meetings of clubs or organizations: More than 4 times per year    Relationship status: Married  . Intimate partner violence:    Fear of current or ex partner: No    Emotionally abused: No    Physically abused: No     Forced sexual activity: No  Other Topics Concern  . Not on file  Social History Narrative   Married to wife, [name]   Retired Insurance account manager w/[employer]   #2 grown children and #6 grandchildren    Family History:  Family History  Problem Relation Age of Onset  . Stroke Mother   . Hypertension Mother   . Stroke Father   . Hypertension Father   . Hypertension Other   . Colon cancer Neg Hx     Medications:   Current Outpatient Medications on File Prior to Visit  Medication Sig Dispense Refill  . acetaminophen (TYLENOL) 325 MG tablet Take 1-2 tablets (325-650 mg total) by mouth every 4 (four) hours as needed for mild pain.    Marland Kitchen allopurinol (ZYLOPRIM) 100 MG tablet Take 1 tablet by mouth once daily 90 tablet 1  . apixaban (ELIQUIS) 2.5 MG TABS tablet Take 1 tablet (2.5 mg total) by mouth 2 (two) times daily. 180 tablet 0  . atorvastatin (LIPITOR) 20 MG tablet TAKE 1 TABLET BY MOUTH  DAILY AT 6 PM. (Patient taking differently: Take 20 mg by mouth daily at 6 PM. ) 90 tablet 1  . budesonide (ENTOCORT EC) 3 MG 24 hr capsule Take 3 capsules (9 mg total) by mouth daily. 90 capsule 1  . calcitRIOL (ROCALTROL) 0.25 MCG capsule TAKE 1 CAPSULE BY MOUTH ONCE DAILY (Patient not taking: Reported on 11/10/2018) 30 capsule 5  . Cholecalciferol (VITAMIN D-3) 125 MCG (5000 UT) TABS Take 5,000 Units by mouth daily.    . diphenoxylate-atropine (LOMOTIL) 2.5-0.025 MG tablet Take 2 tablets by mouth 4 (four) times daily as needed for diarrhea or loose stools. 240 tablet 1  . ferric citrate (AURYXIA) 1 GM 210 MG(Fe) tablet Take 420 mg by mouth 3 (three) times daily with meals.    . gabapentin (NEURONTIN) 300 MG capsule TAKE 1 CAPSULE BY MOUTH EVERY DAY AT BEDTIME. PT NEEDS APPT PRIOR TO FUTURE REFILLS 30 capsule 0  . lipase/protease/amylase (CREON) 36000 UNITS CPEP capsule Please take 2 with meals and one with meals. 36 capsule 0  . metoprolol succinate (TOPROL-XL) 25 MG 24 hr tablet  Take 1 tablet (25 mg total) by mouth daily. 90 tablet 0  . multivitamin (RENA-VIT) TABS tablet TAKE 1 TABLET BY MOUTH AT BEDTIME 90 each 1  . polycarbophil (FIBERCON) 625 MG tablet Take 2 tablets (1,250 mg total) by  mouth 2 (two) times daily. (Patient not taking: Reported on 11/10/2018)     No current facility-administered medications on file prior to visit.     Allergies:   Allergies  Allergen Reactions  . Nsaids Other (See Comments)    Asked by surgeon to add this medication class as intolerance due to patients renal insufficiency.  . Lactose Intolerance (Gi)     Diarrhea   . Amlodipine Other (See Comments)    Edema      Physical Exam  General: well developed, well nourished, pleasant elderly African-American male, seated, in no evident distress Head: head normocephalic and atraumatic.    Neurologic Exam Mental Status: Awake and fully alert.  Mild dysarthria.  Oriented to place and time. Recent and remote memory intact. Attention span, concentration and fund of knowledge appropriate. Mood and affect appropriate.  Cranial Nerves: Extraocular movements full without nystagmus. Hearing intact to voice. Facial sensation intact. Face, tongue, palate moves normally and symmetrically.  Motor: No evidence of large muscle weakness per drift assessment.  Decreased left hand finger tapping.  Unable to stand from seated position with arms crossed. Sensory.: intact to light touch Coordination: orbits right arm over left arm; decreased left hand finger dexterity; difficulty with left leg heal down shin  Gait and Station: Arises from chair with mild difficulty. Stance is normal.  Gait demonstrates hemiplegic gait with favoring of left leg with use of rolling walker Reflexes: UTA     Diagnostic Data (Labs, Imaging, Testing)  CT HEAD WO CONTRAST 06/08/2018 IMPRESSION: 1. Hyperdense Right MCA M1 segment compatible with ELVO. Subtle cytotoxic edema suspected in the right insula, ASPECTS is 9.  2. Negative for bleed.  CT ANGIO HEAD W OR WO CONTRAST CT ANGIO NECK W OR WO CONTRAST CT CEREBRAL PERFUSION W CONTRAST 06/08/2018 IMPRESSION: 1. Positive for LVO (occluded Right ICA, Right ICA terminus and Right MCA) with CT Perfusion criteria favorable for endovascular reperfusion (core infarct estimated at 13 mL with mismatch volume 119 mL, mismatch ratio of 10.2). Preliminary report of this was discussed by telephone with Dr. Amie Portland on 06/08/2018 at 15:46. 2. Positive also for occluded Right Vertebral Artery and soft plaque or thrombus at the Left Vertebral Artery origin. In conjunction with #1 this might reflect sequelae of cardiac or paradoxical emboli. This was discussed with Dr. Juliet Rude in [place] at 1359 hours. 3. Superimposed intracranial atherosclerosis including moderate or severe stenosis of the: - Left ICA supraclinoid segment. - Left vertebral artery V4 segment. - Right PCA P1/P2.  MR BRAIN WO CONTRAST MR MRA HEAD  06/09/2018 IMPRESSION: 1. Patchy acute right MCA infarcts with greatest involvement of the basal ganglia. 2. Mild chronic small vessel ischemic disease. 3. Continued patency of the intracranial right ICA, right ACA, and proximal right MCA following revascularization. Artifact from right MCA stent with decreased number of distal MCA branch vessels. 4. Mild right and moderate to severe left supraclinoid ICA stenoses. 5. Moderate bilateral ACA origin stenoses. 6. Occluded distal right vertebral artery. 7. Severe proximal right P2 stenosis. 8. 2 mm right cavernous ICA aneurysm.  IR ANGIO 06/08/2018 IMPRESSION: Endovascular complete revascularization of occluded right internal carotid artery acutely with stent assisted angioplasty, and the right middle cerebral artery and the right internal carotid artery terminus, and the proximal right anterior cerebral artery with 2 passes with the 5 mm x 33 mm Embotap retrieval device, and 1 pass with the  4 mm x 40 mm X Solitaire retrieval device using a TICI 2b revascularization.  Rescue stenting of recurrent occlusion of the right middle cerebral artery in the right MCA trifurcation region with an Atlas Neuroform 4 mm x 24 mm stent.  Mechanical thrombolysis of acute occlusion of the stented segment of the right middle cerebral artery with 6 mg of super selective intracranial intra-arterial Integrilin, and mechanical thrombolysis using the microcatheter and micro guidewire achieving a TICI 2b revascularization.  Placement of an Enterprise 4 mm x 30 mm stent at the site of a focal dissection in the mid right ICA as described.  ECHOCARDIOGRAM 06/09/2018 Study Conclusions  - Left ventricle: The cavity size was mildly dilated. Systolic   function was severely reduced. The estimated ejection fraction   was in the range of 20% to 25%. Diffuse hypokinesis. - Left atrium: The atrium was mildly dilated. - Right atrium: The atrium was mildly dilated. - Atrial septum: No defect or patent foramen ovale was identified.  Impressions:  - Findings consistent with dilated cardiomyopathy. Consider TEE if   clinically indicated. No obvious source of cerebral embolism.    ASSESSMENT: Jason [name] Sr. is a 73 y.o. year old male here with right MCA infarct on 06/08/2018 secondary to right ICA and MCA occlusion status post IR and rescue stents with TICI 2B reperfusion likely due to large vessel arthrosclerosis but unable to rule out cardioembolic source. Vascular risk factors include HTN, HLD, OSA on CPAP, ESRD, COPD, obesity and former tobacco/alcohol use.  Recent diagnosis of atrial fibrillation seen on 30-day cardiac event monitor and therefore initiated Eliquis which is continuing aspirin and Brilinta.  Residual deficits of mild dysarthria and left hemiparesis.    PLAN:  1. Right MCA infarct: Continue Eliquis (apixaban) daily  and atorvastatin 20 mg for secondary stroke prevention. Maintain  strict control of hypertension with blood pressure goal below 130/90, diabetes with hemoglobin A1c goal below 6.5% and cholesterol with LDL cholesterol (bad cholesterol) goal below 70 mg/dL.  I also advised the patient to eat a healthy diet with plenty of whole grains, cereals, fruits and vegetables, exercise regularly with at least 30 minutes of continuous activity daily and maintain ideal body weight.  As cardioembolic source of stroke cannot be ruled out, 30-day cardiac event monitor ordered. 2. Post stroke left hemiparesis/dysarthria: Ongoing home exercises with financial difficulty of outpatient therapy 3. Atrial fibrillation: Continue Eliquis and ongoing follow-up with cardiology 4. HTN: Advised to continue current treatment regimen.  Advised to continue to monitor at home along with continued follow-up with PCP for management 5. HLD: Advised to continue current treatment regimen along with continued follow-up with PCP for future prescribing and future monitoring of lipid panel but will obtain lipid panel today as this has not been done since initiation of statin 6. Right ICA stent: Initially recommended repeat MRA head towards the end of 09/2018 but this was likely postponed due to COVID-19.  Advised wife to contact vascular surgery office to schedule follow-up appointment 7. OSA on CPAP: Encouraged continued compliance and follow-up with OSA provider    Follow up in 6 months or call earlier if needed   Greater than 50% of time during this 25 minute non-face-to-face visit was spent on counseling, explanation of diagnosis of right MCA infarct, reviewing risk factor management of new diagnosis of PAF, HTN, HLD, large vessel arthrosclerosis and OSA on CPAP, planning of further management along with potential future management, and discussion with patient and family answering all questions.    Venancio Poisson, AGNP-BC  [facility] [place]  [place],  [place] [address]  Phone [phone] Fax [phone] Note: This document was prepared with digital dictation and possible smart phrase technology. Any transcriptional errors that result from this process are unintentional.

## 2018-11-15 NOTE — Telephone Encounter (Signed)
Email sent to [email or web address] for visit at 245pm

## 2018-11-16 DIAGNOSIS — E1121 Type 2 diabetes mellitus with diabetic nephropathy: Secondary | ICD-10-CM | POA: Diagnosis not present

## 2018-11-16 DIAGNOSIS — D689 Coagulation defect, unspecified: Secondary | ICD-10-CM | POA: Diagnosis not present

## 2018-11-16 DIAGNOSIS — R197 Diarrhea, unspecified: Secondary | ICD-10-CM | POA: Diagnosis not present

## 2018-11-16 DIAGNOSIS — N2581 Secondary hyperparathyroidism of renal origin: Secondary | ICD-10-CM | POA: Diagnosis not present

## 2018-11-16 DIAGNOSIS — N186 End stage renal disease: Secondary | ICD-10-CM | POA: Diagnosis not present

## 2018-11-16 NOTE — Progress Notes (Signed)
I agree with the above plan 

## 2018-11-18 DIAGNOSIS — R197 Diarrhea, unspecified: Secondary | ICD-10-CM | POA: Diagnosis not present

## 2018-11-18 DIAGNOSIS — N2581 Secondary hyperparathyroidism of renal origin: Secondary | ICD-10-CM | POA: Diagnosis not present

## 2018-11-18 DIAGNOSIS — D689 Coagulation defect, unspecified: Secondary | ICD-10-CM | POA: Diagnosis not present

## 2018-11-18 DIAGNOSIS — E1121 Type 2 diabetes mellitus with diabetic nephropathy: Secondary | ICD-10-CM | POA: Diagnosis not present

## 2018-11-18 DIAGNOSIS — N186 End stage renal disease: Secondary | ICD-10-CM | POA: Diagnosis not present

## 2018-11-19 ENCOUNTER — Telehealth: Payer: Self-pay | Admitting: Gastroenterology

## 2018-11-19 NOTE — Telephone Encounter (Signed)
Patient called and wanted to let Dr. Loletha Carrow know, since he stopped his Auryxia his stool if semi-formed but "dry sandy"

## 2018-11-20 DIAGNOSIS — N186 End stage renal disease: Secondary | ICD-10-CM | POA: Diagnosis not present

## 2018-11-20 DIAGNOSIS — D689 Coagulation defect, unspecified: Secondary | ICD-10-CM | POA: Diagnosis not present

## 2018-11-20 DIAGNOSIS — E1121 Type 2 diabetes mellitus with diabetic nephropathy: Secondary | ICD-10-CM | POA: Diagnosis not present

## 2018-11-20 DIAGNOSIS — R197 Diarrhea, unspecified: Secondary | ICD-10-CM | POA: Diagnosis not present

## 2018-11-20 DIAGNOSIS — N2581 Secondary hyperparathyroidism of renal origin: Secondary | ICD-10-CM | POA: Diagnosis not present

## 2018-11-22 MED ORDER — PANCRELIPASE (LIP-PROT-AMYL) 36000-114000 UNITS PO CPEP: 36000.0000 [IU] | ORAL_CAPSULE | Freq: Three times a day (TID) | ORAL | 0 refills | Status: DC

## 2018-11-22 MED ORDER — ELUXADOLINE 75 MG PO TABS: 1.0000 | ORAL_TABLET | Freq: Two times a day (BID) | ORAL | 0 refills | Status: DC

## 2018-11-22 NOTE — Telephone Encounter (Signed)
Spoke with patient and then his wife.  He says he is a little better off the [place], but his wife says not much better. Still 3 BM's day, semi-formed.  Lomotil not helping much.   My advice was as follows:   put the lomotil aside since it is not helping.  I sent prescriptions for two meds:    Creon 36,000 pancreatic enzyme.  Two capsules three times daily with meals.  Viberzi 75 mg twice daily.  His wife will call us with an update within 2 weeks.

## 2018-11-22 NOTE — Addendum Note (Signed)
Addended by: Nelida Meuse on: 11/22/2018 04:57 PM   Modules accepted: Orders

## 2018-11-23 DIAGNOSIS — D689 Coagulation defect, unspecified: Secondary | ICD-10-CM | POA: Diagnosis not present

## 2018-11-23 DIAGNOSIS — E1121 Type 2 diabetes mellitus with diabetic nephropathy: Secondary | ICD-10-CM | POA: Diagnosis not present

## 2018-11-23 DIAGNOSIS — N2581 Secondary hyperparathyroidism of renal origin: Secondary | ICD-10-CM | POA: Diagnosis not present

## 2018-11-23 DIAGNOSIS — R197 Diarrhea, unspecified: Secondary | ICD-10-CM | POA: Diagnosis not present

## 2018-11-23 DIAGNOSIS — N186 End stage renal disease: Secondary | ICD-10-CM | POA: Diagnosis not present

## 2018-11-25 DIAGNOSIS — N186 End stage renal disease: Secondary | ICD-10-CM | POA: Diagnosis not present

## 2018-11-25 DIAGNOSIS — N2581 Secondary hyperparathyroidism of renal origin: Secondary | ICD-10-CM | POA: Diagnosis not present

## 2018-11-25 DIAGNOSIS — E1121 Type 2 diabetes mellitus with diabetic nephropathy: Secondary | ICD-10-CM | POA: Diagnosis not present

## 2018-11-25 DIAGNOSIS — R197 Diarrhea, unspecified: Secondary | ICD-10-CM | POA: Diagnosis not present

## 2018-11-25 DIAGNOSIS — D689 Coagulation defect, unspecified: Secondary | ICD-10-CM | POA: Diagnosis not present

## 2018-11-26 ENCOUNTER — Telehealth: Payer: Self-pay | Admitting: Gastroenterology

## 2018-11-26 NOTE — Telephone Encounter (Signed)
[facility] called and needed dx codes for the medication.

## 2018-11-26 NOTE — Telephone Encounter (Signed)
Prior Josem Kaufmann was sent via cover my meds already.

## 2018-11-26 NOTE — Telephone Encounter (Signed)
Gave them dx code of K52.9 chronic diarrhea   As requested   Vivien Rota are you working on or have you seen a prior auth on this patient?

## 2018-11-27 DIAGNOSIS — E1121 Type 2 diabetes mellitus with diabetic nephropathy: Secondary | ICD-10-CM | POA: Diagnosis not present

## 2018-11-27 DIAGNOSIS — R197 Diarrhea, unspecified: Secondary | ICD-10-CM | POA: Diagnosis not present

## 2018-11-27 DIAGNOSIS — N186 End stage renal disease: Secondary | ICD-10-CM | POA: Diagnosis not present

## 2018-11-27 DIAGNOSIS — D689 Coagulation defect, unspecified: Secondary | ICD-10-CM | POA: Diagnosis not present

## 2018-11-27 DIAGNOSIS — N2581 Secondary hyperparathyroidism of renal origin: Secondary | ICD-10-CM | POA: Diagnosis not present

## 2018-11-30 DIAGNOSIS — E1121 Type 2 diabetes mellitus with diabetic nephropathy: Secondary | ICD-10-CM | POA: Diagnosis not present

## 2018-11-30 DIAGNOSIS — D689 Coagulation defect, unspecified: Secondary | ICD-10-CM | POA: Diagnosis not present

## 2018-11-30 DIAGNOSIS — R197 Diarrhea, unspecified: Secondary | ICD-10-CM | POA: Diagnosis not present

## 2018-11-30 DIAGNOSIS — N186 End stage renal disease: Secondary | ICD-10-CM | POA: Diagnosis not present

## 2018-11-30 DIAGNOSIS — N2581 Secondary hyperparathyroidism of renal origin: Secondary | ICD-10-CM | POA: Diagnosis not present

## 2018-11-30 NOTE — Telephone Encounter (Signed)
Faxed received an denied. Appeal faxed on 11-30-2018

## 2018-12-01 ENCOUNTER — Telehealth: Payer: Self-pay | Admitting: Gastroenterology

## 2018-12-01 NOTE — Telephone Encounter (Signed)
See message from insurance regarding Viberzi.

## 2018-12-01 NOTE — Telephone Encounter (Signed)
Appeal was denied. See phone note 12-01-2018

## 2018-12-01 NOTE — Telephone Encounter (Signed)
Jason [name] from [facility] expedited appeals call and wanted to inform that the pt expedited appeal for viverzi has been denied.

## 2018-12-02 DIAGNOSIS — D689 Coagulation defect, unspecified: Secondary | ICD-10-CM | POA: Diagnosis not present

## 2018-12-02 DIAGNOSIS — E1121 Type 2 diabetes mellitus with diabetic nephropathy: Secondary | ICD-10-CM | POA: Diagnosis not present

## 2018-12-02 DIAGNOSIS — N2581 Secondary hyperparathyroidism of renal origin: Secondary | ICD-10-CM | POA: Diagnosis not present

## 2018-12-02 DIAGNOSIS — N186 End stage renal disease: Secondary | ICD-10-CM | POA: Diagnosis not present

## 2018-12-02 DIAGNOSIS — R197 Diarrhea, unspecified: Secondary | ICD-10-CM | POA: Diagnosis not present

## 2018-12-02 NOTE — Telephone Encounter (Signed)
Despite attempted prior authorization and appeal this week, the medicine has been declined.  Please contact Jason [name] and find out whether or not he has improved on the pancreatic enzymes, which he should be taking as 2 capsules 3 times a day with meals.  If not, then I would still like to see if there is a way for him to get Viberzi.  We do not currently have samples since there been no pharmaceutical reps coming to our office for months.  However, please contact that product rep if possible and see if they would be willing and able to provide samples of Viberzi 75 mg twice daily for this patient to try for a week..  If that was possible, and if the medicine helped him, he and his wife might be willing to pay out-of-pocket depending upon the cost.  Also,if it comes to that, GoodRx may have a more favorable cash price.

## 2018-12-02 NOTE — Telephone Encounter (Addendum)
Spoke to phyllis(pts wife) she states they couldn't afford the Viberzi, its ways too expensive. Even with the Good Rx its $1,303 out of pocket expense. She did say that he's taking the Creon 3 times a day and she feels like he has had some improvement. From what  she can tell. Magda Paganini will contact the pharmaceutical rep.to see if we can order some samples online

## 2018-12-04 DIAGNOSIS — N186 End stage renal disease: Secondary | ICD-10-CM | POA: Diagnosis not present

## 2018-12-04 DIAGNOSIS — D689 Coagulation defect, unspecified: Secondary | ICD-10-CM | POA: Diagnosis not present

## 2018-12-04 DIAGNOSIS — N2581 Secondary hyperparathyroidism of renal origin: Secondary | ICD-10-CM | POA: Diagnosis not present

## 2018-12-04 DIAGNOSIS — R197 Diarrhea, unspecified: Secondary | ICD-10-CM | POA: Diagnosis not present

## 2018-12-04 DIAGNOSIS — E1121 Type 2 diabetes mellitus with diabetic nephropathy: Secondary | ICD-10-CM | POA: Diagnosis not present

## 2018-12-05 DIAGNOSIS — Z992 Dependence on renal dialysis: Secondary | ICD-10-CM | POA: Diagnosis not present

## 2018-12-05 DIAGNOSIS — I129 Hypertensive chronic kidney disease with stage 1 through stage 4 chronic kidney disease, or unspecified chronic kidney disease: Secondary | ICD-10-CM | POA: Diagnosis not present

## 2018-12-05 DIAGNOSIS — N186 End stage renal disease: Secondary | ICD-10-CM | POA: Diagnosis not present

## 2018-12-07 DIAGNOSIS — N2581 Secondary hyperparathyroidism of renal origin: Secondary | ICD-10-CM | POA: Diagnosis not present

## 2018-12-07 DIAGNOSIS — D689 Coagulation defect, unspecified: Secondary | ICD-10-CM | POA: Diagnosis not present

## 2018-12-07 DIAGNOSIS — R197 Diarrhea, unspecified: Secondary | ICD-10-CM | POA: Diagnosis not present

## 2018-12-07 DIAGNOSIS — D509 Iron deficiency anemia, unspecified: Secondary | ICD-10-CM | POA: Diagnosis not present

## 2018-12-07 DIAGNOSIS — E1121 Type 2 diabetes mellitus with diabetic nephropathy: Secondary | ICD-10-CM | POA: Diagnosis not present

## 2018-12-07 DIAGNOSIS — N186 End stage renal disease: Secondary | ICD-10-CM | POA: Diagnosis not present

## 2018-12-07 NOTE — Telephone Encounter (Signed)
Samples request has been faxed

## 2018-12-09 DIAGNOSIS — N186 End stage renal disease: Secondary | ICD-10-CM | POA: Diagnosis not present

## 2018-12-09 DIAGNOSIS — D509 Iron deficiency anemia, unspecified: Secondary | ICD-10-CM | POA: Diagnosis not present

## 2018-12-09 DIAGNOSIS — D689 Coagulation defect, unspecified: Secondary | ICD-10-CM | POA: Diagnosis not present

## 2018-12-09 DIAGNOSIS — N2581 Secondary hyperparathyroidism of renal origin: Secondary | ICD-10-CM | POA: Diagnosis not present

## 2018-12-09 DIAGNOSIS — E1121 Type 2 diabetes mellitus with diabetic nephropathy: Secondary | ICD-10-CM | POA: Diagnosis not present

## 2018-12-09 DIAGNOSIS — R197 Diarrhea, unspecified: Secondary | ICD-10-CM | POA: Diagnosis not present

## 2018-12-11 DIAGNOSIS — R197 Diarrhea, unspecified: Secondary | ICD-10-CM | POA: Diagnosis not present

## 2018-12-11 DIAGNOSIS — E1121 Type 2 diabetes mellitus with diabetic nephropathy: Secondary | ICD-10-CM | POA: Diagnosis not present

## 2018-12-11 DIAGNOSIS — D509 Iron deficiency anemia, unspecified: Secondary | ICD-10-CM | POA: Diagnosis not present

## 2018-12-11 DIAGNOSIS — D689 Coagulation defect, unspecified: Secondary | ICD-10-CM | POA: Diagnosis not present

## 2018-12-11 DIAGNOSIS — N2581 Secondary hyperparathyroidism of renal origin: Secondary | ICD-10-CM | POA: Diagnosis not present

## 2018-12-11 DIAGNOSIS — N186 End stage renal disease: Secondary | ICD-10-CM | POA: Diagnosis not present

## 2018-12-14 DIAGNOSIS — D689 Coagulation defect, unspecified: Secondary | ICD-10-CM | POA: Diagnosis not present

## 2018-12-14 DIAGNOSIS — N2581 Secondary hyperparathyroidism of renal origin: Secondary | ICD-10-CM | POA: Diagnosis not present

## 2018-12-14 DIAGNOSIS — R197 Diarrhea, unspecified: Secondary | ICD-10-CM | POA: Diagnosis not present

## 2018-12-14 DIAGNOSIS — N186 End stage renal disease: Secondary | ICD-10-CM | POA: Diagnosis not present

## 2018-12-14 DIAGNOSIS — E1121 Type 2 diabetes mellitus with diabetic nephropathy: Secondary | ICD-10-CM | POA: Diagnosis not present

## 2018-12-14 DIAGNOSIS — D509 Iron deficiency anemia, unspecified: Secondary | ICD-10-CM | POA: Diagnosis not present

## 2018-12-15 NOTE — Telephone Encounter (Signed)
Samples have been received. I spoke to Jason [name] about the samples for trial of Viberzi 75 mg BID. I also made her aware to stop the Lomotil. Written instructions have been printed and set up front with the samples.

## 2018-12-16 DIAGNOSIS — D689 Coagulation defect, unspecified: Secondary | ICD-10-CM | POA: Diagnosis not present

## 2018-12-16 DIAGNOSIS — E1121 Type 2 diabetes mellitus with diabetic nephropathy: Secondary | ICD-10-CM | POA: Diagnosis not present

## 2018-12-16 DIAGNOSIS — D509 Iron deficiency anemia, unspecified: Secondary | ICD-10-CM | POA: Diagnosis not present

## 2018-12-16 DIAGNOSIS — N2581 Secondary hyperparathyroidism of renal origin: Secondary | ICD-10-CM | POA: Diagnosis not present

## 2018-12-16 DIAGNOSIS — N186 End stage renal disease: Secondary | ICD-10-CM | POA: Diagnosis not present

## 2018-12-16 DIAGNOSIS — R197 Diarrhea, unspecified: Secondary | ICD-10-CM | POA: Diagnosis not present

## 2018-12-18 DIAGNOSIS — D509 Iron deficiency anemia, unspecified: Secondary | ICD-10-CM | POA: Diagnosis not present

## 2018-12-18 DIAGNOSIS — N2581 Secondary hyperparathyroidism of renal origin: Secondary | ICD-10-CM | POA: Diagnosis not present

## 2018-12-18 DIAGNOSIS — D689 Coagulation defect, unspecified: Secondary | ICD-10-CM | POA: Diagnosis not present

## 2018-12-18 DIAGNOSIS — N186 End stage renal disease: Secondary | ICD-10-CM | POA: Diagnosis not present

## 2018-12-18 DIAGNOSIS — E1121 Type 2 diabetes mellitus with diabetic nephropathy: Secondary | ICD-10-CM | POA: Diagnosis not present

## 2018-12-18 DIAGNOSIS — R197 Diarrhea, unspecified: Secondary | ICD-10-CM | POA: Diagnosis not present

## 2018-12-21 DIAGNOSIS — R197 Diarrhea, unspecified: Secondary | ICD-10-CM | POA: Diagnosis not present

## 2018-12-21 DIAGNOSIS — D509 Iron deficiency anemia, unspecified: Secondary | ICD-10-CM | POA: Diagnosis not present

## 2018-12-21 DIAGNOSIS — D689 Coagulation defect, unspecified: Secondary | ICD-10-CM | POA: Diagnosis not present

## 2018-12-21 DIAGNOSIS — N186 End stage renal disease: Secondary | ICD-10-CM | POA: Diagnosis not present

## 2018-12-21 DIAGNOSIS — E1121 Type 2 diabetes mellitus with diabetic nephropathy: Secondary | ICD-10-CM | POA: Diagnosis not present

## 2018-12-21 DIAGNOSIS — N2581 Secondary hyperparathyroidism of renal origin: Secondary | ICD-10-CM | POA: Diagnosis not present

## 2018-12-22 ENCOUNTER — Other Ambulatory Visit: Payer: Self-pay | Admitting: Internal Medicine

## 2018-12-22 ENCOUNTER — Telehealth: Payer: Self-pay | Admitting: Internal Medicine

## 2018-12-22 ENCOUNTER — Telehealth: Payer: Self-pay | Admitting: Gastroenterology

## 2018-12-22 DIAGNOSIS — I4891 Unspecified atrial fibrillation: Secondary | ICD-10-CM

## 2018-12-22 DIAGNOSIS — I480 Paroxysmal atrial fibrillation: Secondary | ICD-10-CM | POA: Insufficient documentation

## 2018-12-22 DIAGNOSIS — I63413 Cerebral infarction due to embolism of bilateral middle cerebral arteries: Secondary | ICD-10-CM

## 2018-12-22 NOTE — Telephone Encounter (Signed)
Do you want to change to a cheaper alternative or do patient assistance?

## 2018-12-22 NOTE — Telephone Encounter (Signed)
Patient wife called in wanting to let the nurse know that the samples of Viberzi is running out and will be out by the weekend and wants to know what they need to do.

## 2018-12-22 NOTE — Telephone Encounter (Signed)
pls refer him to the coumadin clinic  TJ

## 2018-12-22 NOTE — Telephone Encounter (Signed)
Wife called, the ELIQUIS 2.5 MG TABS tablet  Is too expensive. They are asking if there is something else that can be called in.  Please call

## 2018-12-22 NOTE — Telephone Encounter (Signed)
Contacted the patient spoke with him and his wife together, patient is having formed stool 2xs a day. Would like to know what to do in reference to the [place] which he will be out of this weekend. He also is on Creon and is approaching the donut hole on insurance and would like to know what help we could give him. We are enrolling him in the [employer] program and hope for assistance with this organization for payment.

## 2018-12-22 NOTE — Telephone Encounter (Signed)
Referral for coumadin clinic entered and scheduled with [facility] for Friday.   Jenny Reichmann,  Let me know if there is anything else that I need to do before his appointment on Friday.

## 2018-12-23 ENCOUNTER — Other Ambulatory Visit: Payer: Self-pay | Admitting: General Practice

## 2018-12-23 ENCOUNTER — Telehealth: Payer: Self-pay | Admitting: General Practice

## 2018-12-23 DIAGNOSIS — R197 Diarrhea, unspecified: Secondary | ICD-10-CM | POA: Diagnosis not present

## 2018-12-23 DIAGNOSIS — N2581 Secondary hyperparathyroidism of renal origin: Secondary | ICD-10-CM | POA: Diagnosis not present

## 2018-12-23 DIAGNOSIS — D509 Iron deficiency anemia, unspecified: Secondary | ICD-10-CM | POA: Diagnosis not present

## 2018-12-23 DIAGNOSIS — E1121 Type 2 diabetes mellitus with diabetic nephropathy: Secondary | ICD-10-CM | POA: Diagnosis not present

## 2018-12-23 DIAGNOSIS — D689 Coagulation defect, unspecified: Secondary | ICD-10-CM | POA: Diagnosis not present

## 2018-12-23 DIAGNOSIS — N186 End stage renal disease: Secondary | ICD-10-CM | POA: Diagnosis not present

## 2018-12-23 DIAGNOSIS — Z7901 Long term (current) use of anticoagulants: Secondary | ICD-10-CM

## 2018-12-23 MED ORDER — WARFARIN SODIUM 5 MG PO TABS: ORAL_TABLET | ORAL | 0 refills | Status: DC

## 2018-12-23 NOTE — Telephone Encounter (Signed)
Patient has been given instructions to take Eliquis and 1 tablet of coumadin (5 mg) today, tomorrow and Saturday.  On Sunday stop the eliquis and continue to take 1 tablet of coumadin through next Thursday and check INR on Friday 6/26.  [name] verbalized understanding.

## 2018-12-24 ENCOUNTER — Other Ambulatory Visit: Payer: Self-pay

## 2018-12-24 ENCOUNTER — Ambulatory Visit: Payer: Medicare Other

## 2018-12-24 MED ORDER — PANCRELIPASE (LIP-PROT-AMYL) 36000-114000 UNITS PO CPEP: 36000.0000 [IU] | ORAL_CAPSULE | Freq: Three times a day (TID) | ORAL | 0 refills | Status: DC

## 2018-12-24 NOTE — Telephone Encounter (Signed)
Rx for the 56 capsule coupon has been printed and ready for pick up.I told Mrs Sleeth that we enrolled her in the Creon ambassador program and they should be reaching out to them soon.   I have also given him the remaining samples of Viberzi 75 mg tabs #28 days.

## 2018-12-24 NOTE — Telephone Encounter (Signed)
Please check if any samples of either, though our sample supplies very low lately.  Difficult to know which medicine made him better.  Suspect will be difficult for them to afford either or both meds.  If must choose between them, I would prefer he stay on Creon (pancreatic enzyme).  A prescription can be sent today for either or both meds if that is what they wish.  The Creon can also be substituted for generic form if patient's pharmacist determines that is available and  less expensive.

## 2018-12-25 DIAGNOSIS — D689 Coagulation defect, unspecified: Secondary | ICD-10-CM | POA: Diagnosis not present

## 2018-12-25 DIAGNOSIS — R197 Diarrhea, unspecified: Secondary | ICD-10-CM | POA: Diagnosis not present

## 2018-12-25 DIAGNOSIS — D509 Iron deficiency anemia, unspecified: Secondary | ICD-10-CM | POA: Diagnosis not present

## 2018-12-25 DIAGNOSIS — N2581 Secondary hyperparathyroidism of renal origin: Secondary | ICD-10-CM | POA: Diagnosis not present

## 2018-12-25 DIAGNOSIS — E1121 Type 2 diabetes mellitus with diabetic nephropathy: Secondary | ICD-10-CM | POA: Diagnosis not present

## 2018-12-25 DIAGNOSIS — N186 End stage renal disease: Secondary | ICD-10-CM | POA: Diagnosis not present

## 2018-12-28 DIAGNOSIS — E1121 Type 2 diabetes mellitus with diabetic nephropathy: Secondary | ICD-10-CM | POA: Diagnosis not present

## 2018-12-28 DIAGNOSIS — D509 Iron deficiency anemia, unspecified: Secondary | ICD-10-CM | POA: Diagnosis not present

## 2018-12-28 DIAGNOSIS — N186 End stage renal disease: Secondary | ICD-10-CM | POA: Diagnosis not present

## 2018-12-28 DIAGNOSIS — R197 Diarrhea, unspecified: Secondary | ICD-10-CM | POA: Diagnosis not present

## 2018-12-28 DIAGNOSIS — D689 Coagulation defect, unspecified: Secondary | ICD-10-CM | POA: Diagnosis not present

## 2018-12-28 DIAGNOSIS — N2581 Secondary hyperparathyroidism of renal origin: Secondary | ICD-10-CM | POA: Diagnosis not present

## 2018-12-29 ENCOUNTER — Encounter: Payer: Self-pay | Admitting: Podiatry

## 2018-12-29 ENCOUNTER — Other Ambulatory Visit: Payer: Self-pay

## 2018-12-29 ENCOUNTER — Ambulatory Visit: Payer: Medicare Other | Admitting: Podiatry

## 2018-12-29 VITALS — Temp 97.9°F

## 2018-12-29 DIAGNOSIS — M79675 Pain in left toe(s): Secondary | ICD-10-CM | POA: Diagnosis not present

## 2018-12-29 DIAGNOSIS — G629 Polyneuropathy, unspecified: Secondary | ICD-10-CM

## 2018-12-29 DIAGNOSIS — M79674 Pain in right toe(s): Secondary | ICD-10-CM

## 2018-12-29 DIAGNOSIS — B351 Tinea unguium: Secondary | ICD-10-CM | POA: Diagnosis not present

## 2018-12-29 DIAGNOSIS — L84 Corns and callosities: Secondary | ICD-10-CM | POA: Diagnosis not present

## 2018-12-29 NOTE — Patient Instructions (Signed)

## 2018-12-29 NOTE — Progress Notes (Signed)
Subjective: Jason Cotta Sr. presents today with history of neuropathy. Patient seen for follow up of chronic, painful mycotic toenails which interfere with daily activities and routine tasks.  Pain is aggravated when wearing enclosed shoe gear. Pain is getting progressively worse and relieved with periodic professional debridement.   Jason Lima, MD is his PCP. Last visit was 09/20/2018.   Current Outpatient Medications:  .  acetaminophen (TYLENOL) 325 MG tablet, Take 1-2 tablets (325-650 mg total) by mouth every 4 (four) hours as needed for mild pain., Disp: , Rfl:  .  allopurinol (ZYLOPRIM) 100 MG tablet, Take 1 tablet by mouth once daily, Disp: 90 tablet, Rfl: 1 .  atorvastatin (LIPITOR) 20 MG tablet, TAKE 1 TABLET BY MOUTH  DAILY AT 6 PM. (Patient taking differently: Take 20 mg by mouth daily at 6 PM. ), Disp: 90 tablet, Rfl: 1 .  budesonide (ENTOCORT EC) 3 MG 24 hr capsule, Take 3 capsules (9 mg total) by mouth daily., Disp: 90 capsule, Rfl: 1 .  calcitRIOL (ROCALTROL) 0.25 MCG capsule, TAKE 1 CAPSULE BY MOUTH ONCE DAILY, Disp: 30 capsule, Rfl: 5 .  Cholecalciferol (VITAMIN D-3) 125 MCG (5000 UT) TABS, Take 5,000 Units by mouth daily., Disp: , Rfl:  .  diphenoxylate-atropine (LOMOTIL) 2.5-0.025 MG tablet, Take 2 tablets by mouth 4 (four) times daily as needed for diarrhea or loose stools., Disp: 240 tablet, Rfl: 1 .  Eluxadoline (VIBERZI) 75 MG TABS, Take 1 tablet by mouth 2 (two) times daily., Disp: 60 tablet, Rfl: 0 .  ferric citrate (AURYXIA) 1 GM 210 MG(Fe) tablet, Take 420 mg by mouth 3 (three) times daily with meals., Disp: , Rfl:  .  gabapentin (NEURONTIN) 300 MG capsule, TAKE 1 CAPSULE BY MOUTH EVERY DAY AT BEDTIME. PT NEEDS APPT PRIOR TO FUTURE REFILLS, Disp: 30 capsule, Rfl: 0 .  lipase/protease/amylase (CREON) 36000 UNITS CPEP capsule, Take 1 capsule (36,000 Units total) by mouth 3 (three) times daily with meals., Disp: 180 capsule, Rfl: 0 .  lipase/protease/amylase (CREON)  36000 UNITS CPEP capsule, Take 1 capsule (36,000 Units total) by mouth 3 (three) times daily with meals., Disp: 56 capsule, Rfl: 0 .  metoprolol succinate (TOPROL-XL) 25 MG 24 hr tablet, Take 1 tablet (25 mg total) by mouth daily., Disp: 90 tablet, Rfl: 0 .  multivitamin (RENA-VIT) TABS tablet, TAKE 1 TABLET BY MOUTH AT BEDTIME, Disp: 90 each, Rfl: 1 .  pantoprazole (PROTONIX) 40 MG tablet, Take 40 mg by mouth daily., Disp: , Rfl:  .  polycarbophil (FIBERCON) 625 MG tablet, Take 2 tablets (1,250 mg total) by mouth 2 (two) times daily., Disp: , Rfl:  .  warfarin (COUMADIN) 5 MG tablet, Take 1 tablet daily or as directed by anticoagulation clinic, Disp: 30 tablet, Rfl: 0  Allergies  Allergen Reactions  . Nsaids Other (See Comments)    Asked by surgeon to add this medication class as intolerance due to patients renal insufficiency.  . Lactose Intolerance (Gi)     Diarrhea   . Amlodipine Other (See Comments)    Edema     Objective: Vitals:   12/29/18 0849  Temp: 97.9 F (36.6 C)    Vascular Examination: Capillary refill time <3 seconds x 10 digits.  Dorsalis pedis pulses palpable b/l.  Posterior tibial pulses palpable b/l.  No digital hair x 10 digits.  Skin temperature WNL b/l.  Dermatological Examination: Skin thin, shiny and atrophic b/l.  Toenails 1-5 right, 1-4 left b/l discolored, thick, dystrophic with subungual debris and  pain with palpation to nailbeds due to thickness of nails.  Anonychia left 5th digit with nailbed intact.  Hyperkeratotic lesion(s) dorsal 5th PIPJ right foot. No erythema, no edema, no drainage, no flocculence noted.   Musculoskeletal: Muscle strength 5/5 to all LE muscle groups.  HAV with bunion b/l right > left.  Hammertoe deformity 5th digit b/l.  Neurological: Sensation diminished with 10 gram monofilament.  Vibratory sensation diminished  Assessment: 1. Painful onychomycosis toenails 1-5 b/l 2. Corn right 5th  digit 3. Neuropathy  Plan: 1. Toenails 1-5 b/l were debrided in length and girth without iatrogenic bleeding. 2. Corn(s) pared right 5th digit PIPJ utilizing sterile scalpel blade without incident.  3. Patient to continue soft, supportive shoe gear daily. 4. Patient to report any pedal injuries to medical professional immediately. 5. Follow up 3 months.  6. Patient/POA to call should there be a concern in the interim.

## 2018-12-30 DIAGNOSIS — N2581 Secondary hyperparathyroidism of renal origin: Secondary | ICD-10-CM | POA: Diagnosis not present

## 2018-12-30 DIAGNOSIS — N186 End stage renal disease: Secondary | ICD-10-CM | POA: Diagnosis not present

## 2018-12-30 DIAGNOSIS — D509 Iron deficiency anemia, unspecified: Secondary | ICD-10-CM | POA: Diagnosis not present

## 2018-12-30 DIAGNOSIS — E1121 Type 2 diabetes mellitus with diabetic nephropathy: Secondary | ICD-10-CM | POA: Diagnosis not present

## 2018-12-30 DIAGNOSIS — R197 Diarrhea, unspecified: Secondary | ICD-10-CM | POA: Diagnosis not present

## 2018-12-30 DIAGNOSIS — D689 Coagulation defect, unspecified: Secondary | ICD-10-CM | POA: Diagnosis not present

## 2018-12-31 ENCOUNTER — Other Ambulatory Visit: Payer: Self-pay

## 2018-12-31 ENCOUNTER — Ambulatory Visit (INDEPENDENT_AMBULATORY_CARE_PROVIDER_SITE_OTHER): Payer: Medicare Other | Admitting: General Practice

## 2018-12-31 DIAGNOSIS — Z7901 Long term (current) use of anticoagulants: Secondary | ICD-10-CM | POA: Diagnosis not present

## 2018-12-31 DIAGNOSIS — I4891 Unspecified atrial fibrillation: Secondary | ICD-10-CM

## 2018-12-31 LAB — POCT INR: INR: 1.7 — AB (ref 2.0–3.0)

## 2018-12-31 NOTE — Patient Instructions (Addendum)
Pre visit review using our clinic review tool, if applicable. No additional management support is needed unless otherwise documented below in the visit note.  Take 1 1/2 tablets today (6/26) and then take 1 tablet daily except 1 1/2 tablets every Friday.  Re-check in 2 weeks (due to being closed on 7/3).    A full discussion of the nature of anticoagulants has been carried out.  A benefit risk analysis has been presented to the patient, so that they understand the justification for choosing anticoagulation at this time. The need for frequent and regular monitoring, precise dosage adjustment and compliance is stressed.  Side effects of potential bleeding are discussed.  The patient should avoid any OTC items containing aspirin or ibuprofen, and should avoid great swings in general diet.  Avoid alcohol consumption.

## 2019-01-01 DIAGNOSIS — D509 Iron deficiency anemia, unspecified: Secondary | ICD-10-CM | POA: Diagnosis not present

## 2019-01-01 DIAGNOSIS — D689 Coagulation defect, unspecified: Secondary | ICD-10-CM | POA: Diagnosis not present

## 2019-01-01 DIAGNOSIS — R197 Diarrhea, unspecified: Secondary | ICD-10-CM | POA: Diagnosis not present

## 2019-01-01 DIAGNOSIS — N2581 Secondary hyperparathyroidism of renal origin: Secondary | ICD-10-CM | POA: Diagnosis not present

## 2019-01-01 DIAGNOSIS — E1121 Type 2 diabetes mellitus with diabetic nephropathy: Secondary | ICD-10-CM | POA: Diagnosis not present

## 2019-01-01 DIAGNOSIS — N186 End stage renal disease: Secondary | ICD-10-CM | POA: Diagnosis not present

## 2019-01-03 ENCOUNTER — Other Ambulatory Visit (HOSPITAL_COMMUNITY): Payer: Self-pay | Admitting: Interventional Radiology

## 2019-01-03 DIAGNOSIS — I639 Cerebral infarction, unspecified: Secondary | ICD-10-CM

## 2019-01-04 ENCOUNTER — Telehealth: Payer: Self-pay | Admitting: Gastroenterology

## 2019-01-04 ENCOUNTER — Encounter: Payer: Self-pay | Admitting: Speech Pathology

## 2019-01-04 DIAGNOSIS — N186 End stage renal disease: Secondary | ICD-10-CM | POA: Diagnosis not present

## 2019-01-04 DIAGNOSIS — R197 Diarrhea, unspecified: Secondary | ICD-10-CM | POA: Diagnosis not present

## 2019-01-04 DIAGNOSIS — N2581 Secondary hyperparathyroidism of renal origin: Secondary | ICD-10-CM | POA: Diagnosis not present

## 2019-01-04 DIAGNOSIS — E1121 Type 2 diabetes mellitus with diabetic nephropathy: Secondary | ICD-10-CM | POA: Diagnosis not present

## 2019-01-04 DIAGNOSIS — D689 Coagulation defect, unspecified: Secondary | ICD-10-CM | POA: Diagnosis not present

## 2019-01-04 DIAGNOSIS — G4733 Obstructive sleep apnea (adult) (pediatric): Secondary | ICD-10-CM | POA: Diagnosis not present

## 2019-01-04 DIAGNOSIS — D509 Iron deficiency anemia, unspecified: Secondary | ICD-10-CM | POA: Diagnosis not present

## 2019-01-04 DIAGNOSIS — I129 Hypertensive chronic kidney disease with stage 1 through stage 4 chronic kidney disease, or unspecified chronic kidney disease: Secondary | ICD-10-CM | POA: Diagnosis not present

## 2019-01-04 DIAGNOSIS — Z992 Dependence on renal dialysis: Secondary | ICD-10-CM | POA: Diagnosis not present

## 2019-01-04 NOTE — Telephone Encounter (Signed)
Patient wife called in wanting to speak with the nurse about dropping some papers off to be filled out so that the patient can received some medication. She did not advised of what medication the forms are needing to be filled out for. Please call thanks.

## 2019-01-04 NOTE — Telephone Encounter (Signed)
Left message to return call 

## 2019-01-04 NOTE — Therapy (Signed)
SPEECH THERAPY DISCHARGE SUMMARY  Visits from Start of Care: 1  Current functional level related to goals / functional outcomes: See evaluation for details; goals below. Pt did not return for treatment after evaluation; appointments canceled at his request due to copay.   Remaining deficits: Moderate dysarthria, cognitive deficits, dyslexia.   Education / Equipment: Deficit areas, proposed therapy goals  Plan: Patient agrees to discharge.  Patient goals were not met. Patient is being discharged due to financial reasons.  ?????         SLP Short Term Goals - 01/04/19 0946      SLP SHORT TERM GOAL #1   Title  Pt will complete HEP for dysarthria with occasional min A x 3 visits.    Time  4    Period  Weeks    Status  Not Met      SLP SHORT TERM GOAL #2   Title  Pt will complete cognitive linquistic testing by session 5, if necessary.    Time  2    Period  Weeks    Status  Not Met      SLP SHORT TERM GOAL #3   Title  Pt will read personally relevant words (family names, sports, fishing terminology) 85% accuracy with occasional min A.    Status  Not Met      SLP SHORT TERM GOAL #4   Title  Pt will generate phrase or short sentences responses 18/20 >70dB x 3 sessions.     Time  4    Period  Weeks    Status  New      SLP Long Term Goals - 01/04/19 0946      SLP LONG TERM GOAL #1   Title  Pt will demo HEP for dysarthria with modified independence x 3 visits.    Time  8    Period  Weeks    Status  Not Met      SLP LONG TERM GOAL #2   Title  Pt will report fewer requests from wife to repeat himself.    Time  8    Period  Weeks    Status  Not Met      SLP LONG TERM GOAL #3   Title  Pt will read brief, simple paragraphs (sports, [employer]) with comprehension >80% x 3 sessions, compensations allowed.     Time  8    Period  Weeks    Status  Not Met      SLP LONG TERM GOAL #4   Title  Pt will maintain average >68dB in 5 minutes simple conversation x 3 visits.     Time  8    Period  Weeks    Status  Not Met      Deneise Lever, MS, [facility] [address] [place], [place], [address] Phone: [phone]   Fax:  [phone]  Patient Details  Name: Jason VIRGIL Sr. MRN: [id number] Date of Birth: 26-Oct-1945 Referring Provider:  No ref. provider found  Encounter Date: 01/04/2019   Jason [name] 01/04/2019, 9:46 AM  [facility] [address] [place], [place], [address] Phone: [phone]   Fax:  [phone]

## 2019-01-05 NOTE — Telephone Encounter (Signed)
Pt's wife returned your call.

## 2019-01-05 NOTE — Telephone Encounter (Signed)
Mr Winchell dropped off paperwork for creon assistance program. Will await Dr Loletha Carrow to sign and give back

## 2019-01-06 ENCOUNTER — Encounter: Payer: Self-pay | Admitting: Rehabilitative and Restorative Service Providers"

## 2019-01-06 DIAGNOSIS — R197 Diarrhea, unspecified: Secondary | ICD-10-CM | POA: Diagnosis not present

## 2019-01-06 DIAGNOSIS — N186 End stage renal disease: Secondary | ICD-10-CM | POA: Diagnosis not present

## 2019-01-06 DIAGNOSIS — D509 Iron deficiency anemia, unspecified: Secondary | ICD-10-CM | POA: Diagnosis not present

## 2019-01-06 DIAGNOSIS — N2581 Secondary hyperparathyroidism of renal origin: Secondary | ICD-10-CM | POA: Diagnosis not present

## 2019-01-06 DIAGNOSIS — D689 Coagulation defect, unspecified: Secondary | ICD-10-CM | POA: Diagnosis not present

## 2019-01-06 NOTE — Therapy (Signed)
[facility] [address] [place], [place], [address] Phone: [phone]   Fax:  [phone]  Patient Details  Name: Jason TREFZ Sr. MRN: [id number] Date of Birth: 1946-06-28 Referring Provider:  No ref. provider found  Encounter Date: 01/06/2019   PHYSICAL THERAPY DISCHARGE SUMMARY  Visits from Start of Care: eval only  Current functional level related to goals / functional outcomes: *did not return due to financial limitations.   Remaining deficits: See eval for patient status.   Education / Equipment: n/a  Plan: Patient agrees to discharge.  Patient goals were not met. Patient is being discharged due to financial reasons.  ?????    Thank you for the referral of this patient. Rudell Cobb, MPT    Reyes Fifield 01/06/2019, 9:44 AM  [facility] [address] [place], [place], [address] Phone: [phone]   Fax:  [phone]

## 2019-01-08 DIAGNOSIS — D689 Coagulation defect, unspecified: Secondary | ICD-10-CM | POA: Diagnosis not present

## 2019-01-08 DIAGNOSIS — R197 Diarrhea, unspecified: Secondary | ICD-10-CM | POA: Diagnosis not present

## 2019-01-08 DIAGNOSIS — N186 End stage renal disease: Secondary | ICD-10-CM | POA: Diagnosis not present

## 2019-01-08 DIAGNOSIS — D509 Iron deficiency anemia, unspecified: Secondary | ICD-10-CM | POA: Diagnosis not present

## 2019-01-08 DIAGNOSIS — N2581 Secondary hyperparathyroidism of renal origin: Secondary | ICD-10-CM | POA: Diagnosis not present

## 2019-01-11 DIAGNOSIS — R197 Diarrhea, unspecified: Secondary | ICD-10-CM | POA: Diagnosis not present

## 2019-01-11 DIAGNOSIS — N186 End stage renal disease: Secondary | ICD-10-CM | POA: Diagnosis not present

## 2019-01-11 DIAGNOSIS — D689 Coagulation defect, unspecified: Secondary | ICD-10-CM | POA: Diagnosis not present

## 2019-01-11 DIAGNOSIS — N2581 Secondary hyperparathyroidism of renal origin: Secondary | ICD-10-CM | POA: Diagnosis not present

## 2019-01-11 DIAGNOSIS — D509 Iron deficiency anemia, unspecified: Secondary | ICD-10-CM | POA: Diagnosis not present

## 2019-01-12 ENCOUNTER — Other Ambulatory Visit: Payer: Self-pay | Admitting: Gastroenterology

## 2019-01-13 DIAGNOSIS — D689 Coagulation defect, unspecified: Secondary | ICD-10-CM | POA: Diagnosis not present

## 2019-01-13 DIAGNOSIS — N2581 Secondary hyperparathyroidism of renal origin: Secondary | ICD-10-CM | POA: Diagnosis not present

## 2019-01-13 DIAGNOSIS — N186 End stage renal disease: Secondary | ICD-10-CM | POA: Diagnosis not present

## 2019-01-13 DIAGNOSIS — D509 Iron deficiency anemia, unspecified: Secondary | ICD-10-CM | POA: Diagnosis not present

## 2019-01-13 DIAGNOSIS — R197 Diarrhea, unspecified: Secondary | ICD-10-CM | POA: Diagnosis not present

## 2019-01-14 ENCOUNTER — Other Ambulatory Visit: Payer: Self-pay

## 2019-01-14 ENCOUNTER — Telehealth: Payer: Self-pay | Admitting: Gastroenterology

## 2019-01-14 ENCOUNTER — Ambulatory Visit (INDEPENDENT_AMBULATORY_CARE_PROVIDER_SITE_OTHER): Payer: Medicare Other | Admitting: General Practice

## 2019-01-14 ENCOUNTER — Other Ambulatory Visit: Payer: Self-pay | Admitting: Gastroenterology

## 2019-01-14 DIAGNOSIS — Z7901 Long term (current) use of anticoagulants: Secondary | ICD-10-CM

## 2019-01-14 DIAGNOSIS — I4891 Unspecified atrial fibrillation: Secondary | ICD-10-CM

## 2019-01-14 LAB — POCT INR: INR: 2.6 (ref 2.0–3.0)

## 2019-01-14 MED ORDER — BUDESONIDE 3 MG PO CPEP: 9.0000 mg | ORAL_CAPSULE | Freq: Every day | ORAL | 1 refills | Status: DC

## 2019-01-14 NOTE — Telephone Encounter (Signed)
I was confused because that is different than what they had previously told me.  He and his wife previously reported that the medicine had not been helpful at all, which is why I had recommended stopping it.  Last I knew, he was improved on Viberzi and pancreatic enzymes.  Please confirm whether or not he is taking those medications.  If he is still taking them, and they feel that the diarrhea has been worse off the budesonide, then it can be refilled at a dose of 9 mg once daily, please send a prescription for a month supply with 1 refill.

## 2019-01-14 NOTE — Telephone Encounter (Signed)
Per [name], he has used all the samples of Viberzi and has only been taking the Creon and the budesonide. Refills sent as requested

## 2019-01-14 NOTE — Telephone Encounter (Signed)
Patient wife called would like a refill for budesonide

## 2019-01-14 NOTE — Patient Instructions (Addendum)
Pre visit review using our clinic review tool, if applicable. No additional management support is needed unless otherwise documented below in the visit note.  Continue to take 1 tablet daily except 1 1/2 tablets every Friday.  Re-check in 3 weeks.

## 2019-01-14 NOTE — Telephone Encounter (Signed)
Spoke to patients wife (phyllis). Mr Roper has been taking the budesonide 9 mg a day. He's been out a week and has had an increase of diarrhea.   Please advise

## 2019-01-15 DIAGNOSIS — R197 Diarrhea, unspecified: Secondary | ICD-10-CM | POA: Diagnosis not present

## 2019-01-15 DIAGNOSIS — D689 Coagulation defect, unspecified: Secondary | ICD-10-CM | POA: Diagnosis not present

## 2019-01-15 DIAGNOSIS — D509 Iron deficiency anemia, unspecified: Secondary | ICD-10-CM | POA: Diagnosis not present

## 2019-01-15 DIAGNOSIS — N2581 Secondary hyperparathyroidism of renal origin: Secondary | ICD-10-CM | POA: Diagnosis not present

## 2019-01-15 DIAGNOSIS — N186 End stage renal disease: Secondary | ICD-10-CM | POA: Diagnosis not present

## 2019-01-18 DIAGNOSIS — D689 Coagulation defect, unspecified: Secondary | ICD-10-CM | POA: Diagnosis not present

## 2019-01-18 DIAGNOSIS — R197 Diarrhea, unspecified: Secondary | ICD-10-CM | POA: Diagnosis not present

## 2019-01-18 DIAGNOSIS — N2581 Secondary hyperparathyroidism of renal origin: Secondary | ICD-10-CM | POA: Diagnosis not present

## 2019-01-18 DIAGNOSIS — N186 End stage renal disease: Secondary | ICD-10-CM | POA: Diagnosis not present

## 2019-01-18 DIAGNOSIS — D509 Iron deficiency anemia, unspecified: Secondary | ICD-10-CM | POA: Diagnosis not present

## 2019-01-19 NOTE — Telephone Encounter (Signed)
I truly feel that the Viberzi was likely doing much more to control this diarrhea than was the budesonide.  If they have to choose between paying for 1 of those 2 medicines, I would much prefer that he take the [place].  If we have any way of getting additional samples, it would be very helpful for him.

## 2019-01-20 DIAGNOSIS — N2581 Secondary hyperparathyroidism of renal origin: Secondary | ICD-10-CM | POA: Diagnosis not present

## 2019-01-20 DIAGNOSIS — D689 Coagulation defect, unspecified: Secondary | ICD-10-CM | POA: Diagnosis not present

## 2019-01-20 DIAGNOSIS — D509 Iron deficiency anemia, unspecified: Secondary | ICD-10-CM | POA: Diagnosis not present

## 2019-01-20 DIAGNOSIS — R197 Diarrhea, unspecified: Secondary | ICD-10-CM | POA: Diagnosis not present

## 2019-01-20 DIAGNOSIS — N186 End stage renal disease: Secondary | ICD-10-CM | POA: Diagnosis not present

## 2019-01-20 NOTE — Telephone Encounter (Signed)
Patient cannot afford the Viberzi the cost over $1,000. Jason [name] is going to try and get some more samples for him. They are also working on patient assistance for Creon. I have 6 boxes of Creon for them to pick up here in the office. Left a voicemail to make the patient aware.

## 2019-01-22 DIAGNOSIS — D689 Coagulation defect, unspecified: Secondary | ICD-10-CM | POA: Diagnosis not present

## 2019-01-22 DIAGNOSIS — R197 Diarrhea, unspecified: Secondary | ICD-10-CM | POA: Diagnosis not present

## 2019-01-22 DIAGNOSIS — N2581 Secondary hyperparathyroidism of renal origin: Secondary | ICD-10-CM | POA: Diagnosis not present

## 2019-01-22 DIAGNOSIS — D509 Iron deficiency anemia, unspecified: Secondary | ICD-10-CM | POA: Diagnosis not present

## 2019-01-22 DIAGNOSIS — N186 End stage renal disease: Secondary | ICD-10-CM | POA: Diagnosis not present

## 2019-01-23 ENCOUNTER — Other Ambulatory Visit: Payer: Self-pay | Admitting: Internal Medicine

## 2019-01-23 DIAGNOSIS — Z7901 Long term (current) use of anticoagulants: Secondary | ICD-10-CM

## 2019-01-24 ENCOUNTER — Encounter (HOSPITAL_COMMUNITY): Payer: Self-pay

## 2019-01-24 ENCOUNTER — Ambulatory Visit (HOSPITAL_COMMUNITY): Admission: RE | Admit: 2019-01-24 | Payer: Medicare Other | Source: Ambulatory Visit

## 2019-01-25 DIAGNOSIS — R197 Diarrhea, unspecified: Secondary | ICD-10-CM | POA: Insufficient documentation

## 2019-01-25 DIAGNOSIS — D631 Anemia in chronic kidney disease: Secondary | ICD-10-CM | POA: Diagnosis not present

## 2019-01-25 DIAGNOSIS — R52 Pain, unspecified: Secondary | ICD-10-CM | POA: Insufficient documentation

## 2019-01-25 DIAGNOSIS — N2581 Secondary hyperparathyroidism of renal origin: Secondary | ICD-10-CM | POA: Diagnosis not present

## 2019-01-25 DIAGNOSIS — N529 Male erectile dysfunction, unspecified: Secondary | ICD-10-CM | POA: Insufficient documentation

## 2019-01-25 DIAGNOSIS — I120 Hypertensive chronic kidney disease with stage 5 chronic kidney disease or end stage renal disease: Secondary | ICD-10-CM | POA: Insufficient documentation

## 2019-01-25 DIAGNOSIS — E876 Hypokalemia: Secondary | ICD-10-CM | POA: Insufficient documentation

## 2019-01-25 DIAGNOSIS — D689 Coagulation defect, unspecified: Secondary | ICD-10-CM | POA: Diagnosis not present

## 2019-01-25 DIAGNOSIS — M150 Primary generalized (osteo)arthritis: Secondary | ICD-10-CM | POA: Insufficient documentation

## 2019-01-25 DIAGNOSIS — L299 Pruritus, unspecified: Secondary | ICD-10-CM | POA: Insufficient documentation

## 2019-01-25 DIAGNOSIS — J449 Chronic obstructive pulmonary disease, unspecified: Secondary | ICD-10-CM | POA: Insufficient documentation

## 2019-01-25 DIAGNOSIS — G9009 Other idiopathic peripheral autonomic neuropathy: Secondary | ICD-10-CM | POA: Insufficient documentation

## 2019-01-25 DIAGNOSIS — N186 End stage renal disease: Secondary | ICD-10-CM | POA: Diagnosis not present

## 2019-01-25 DIAGNOSIS — K5790 Diverticulosis of intestine, part unspecified, without perforation or abscess without bleeding: Secondary | ICD-10-CM | POA: Insufficient documentation

## 2019-01-27 DIAGNOSIS — N2581 Secondary hyperparathyroidism of renal origin: Secondary | ICD-10-CM | POA: Diagnosis not present

## 2019-01-27 DIAGNOSIS — R197 Diarrhea, unspecified: Secondary | ICD-10-CM | POA: Diagnosis not present

## 2019-01-27 DIAGNOSIS — D689 Coagulation defect, unspecified: Secondary | ICD-10-CM | POA: Diagnosis not present

## 2019-01-27 DIAGNOSIS — D631 Anemia in chronic kidney disease: Secondary | ICD-10-CM | POA: Diagnosis not present

## 2019-01-27 DIAGNOSIS — E876 Hypokalemia: Secondary | ICD-10-CM | POA: Diagnosis not present

## 2019-01-27 DIAGNOSIS — N186 End stage renal disease: Secondary | ICD-10-CM | POA: Diagnosis not present

## 2019-01-29 DIAGNOSIS — N186 End stage renal disease: Secondary | ICD-10-CM | POA: Diagnosis not present

## 2019-01-29 DIAGNOSIS — D631 Anemia in chronic kidney disease: Secondary | ICD-10-CM | POA: Diagnosis not present

## 2019-01-29 DIAGNOSIS — R197 Diarrhea, unspecified: Secondary | ICD-10-CM | POA: Diagnosis not present

## 2019-01-29 DIAGNOSIS — E876 Hypokalemia: Secondary | ICD-10-CM | POA: Diagnosis not present

## 2019-01-29 DIAGNOSIS — D689 Coagulation defect, unspecified: Secondary | ICD-10-CM | POA: Diagnosis not present

## 2019-01-29 DIAGNOSIS — N2581 Secondary hyperparathyroidism of renal origin: Secondary | ICD-10-CM | POA: Diagnosis not present

## 2019-02-01 DIAGNOSIS — E876 Hypokalemia: Secondary | ICD-10-CM | POA: Diagnosis not present

## 2019-02-01 DIAGNOSIS — D689 Coagulation defect, unspecified: Secondary | ICD-10-CM | POA: Diagnosis not present

## 2019-02-01 DIAGNOSIS — N186 End stage renal disease: Secondary | ICD-10-CM | POA: Diagnosis not present

## 2019-02-01 DIAGNOSIS — R197 Diarrhea, unspecified: Secondary | ICD-10-CM | POA: Diagnosis not present

## 2019-02-01 DIAGNOSIS — N2581 Secondary hyperparathyroidism of renal origin: Secondary | ICD-10-CM | POA: Diagnosis not present

## 2019-02-01 DIAGNOSIS — D631 Anemia in chronic kidney disease: Secondary | ICD-10-CM | POA: Diagnosis not present

## 2019-02-03 DIAGNOSIS — E876 Hypokalemia: Secondary | ICD-10-CM | POA: Diagnosis not present

## 2019-02-03 DIAGNOSIS — N186 End stage renal disease: Secondary | ICD-10-CM | POA: Diagnosis not present

## 2019-02-03 DIAGNOSIS — R197 Diarrhea, unspecified: Secondary | ICD-10-CM | POA: Diagnosis not present

## 2019-02-03 DIAGNOSIS — N2581 Secondary hyperparathyroidism of renal origin: Secondary | ICD-10-CM | POA: Diagnosis not present

## 2019-02-03 DIAGNOSIS — D631 Anemia in chronic kidney disease: Secondary | ICD-10-CM | POA: Diagnosis not present

## 2019-02-03 DIAGNOSIS — D689 Coagulation defect, unspecified: Secondary | ICD-10-CM | POA: Diagnosis not present

## 2019-02-04 ENCOUNTER — Ambulatory Visit: Payer: Medicare Other

## 2019-02-04 DIAGNOSIS — I129 Hypertensive chronic kidney disease with stage 1 through stage 4 chronic kidney disease, or unspecified chronic kidney disease: Secondary | ICD-10-CM | POA: Diagnosis not present

## 2019-02-04 DIAGNOSIS — Z992 Dependence on renal dialysis: Secondary | ICD-10-CM | POA: Diagnosis not present

## 2019-02-04 DIAGNOSIS — N186 End stage renal disease: Secondary | ICD-10-CM | POA: Diagnosis not present

## 2019-02-05 DIAGNOSIS — Z992 Dependence on renal dialysis: Secondary | ICD-10-CM | POA: Diagnosis not present

## 2019-02-05 DIAGNOSIS — D689 Coagulation defect, unspecified: Secondary | ICD-10-CM | POA: Diagnosis not present

## 2019-02-05 DIAGNOSIS — E876 Hypokalemia: Secondary | ICD-10-CM | POA: Diagnosis not present

## 2019-02-05 DIAGNOSIS — D631 Anemia in chronic kidney disease: Secondary | ICD-10-CM | POA: Diagnosis not present

## 2019-02-05 DIAGNOSIS — R197 Diarrhea, unspecified: Secondary | ICD-10-CM | POA: Diagnosis not present

## 2019-02-05 DIAGNOSIS — N2581 Secondary hyperparathyroidism of renal origin: Secondary | ICD-10-CM | POA: Diagnosis not present

## 2019-02-05 DIAGNOSIS — N186 End stage renal disease: Secondary | ICD-10-CM | POA: Diagnosis not present

## 2019-02-08 DIAGNOSIS — Z992 Dependence on renal dialysis: Secondary | ICD-10-CM | POA: Diagnosis not present

## 2019-02-08 DIAGNOSIS — N186 End stage renal disease: Secondary | ICD-10-CM | POA: Diagnosis not present

## 2019-02-08 DIAGNOSIS — D631 Anemia in chronic kidney disease: Secondary | ICD-10-CM | POA: Diagnosis not present

## 2019-02-08 DIAGNOSIS — D689 Coagulation defect, unspecified: Secondary | ICD-10-CM | POA: Diagnosis not present

## 2019-02-08 DIAGNOSIS — E876 Hypokalemia: Secondary | ICD-10-CM | POA: Diagnosis not present

## 2019-02-08 DIAGNOSIS — R197 Diarrhea, unspecified: Secondary | ICD-10-CM | POA: Diagnosis not present

## 2019-02-08 DIAGNOSIS — N2581 Secondary hyperparathyroidism of renal origin: Secondary | ICD-10-CM | POA: Diagnosis not present

## 2019-02-09 ENCOUNTER — Other Ambulatory Visit: Payer: Self-pay

## 2019-02-09 ENCOUNTER — Other Ambulatory Visit: Payer: Self-pay | Admitting: Internal Medicine

## 2019-02-09 DIAGNOSIS — Z87891 Personal history of nicotine dependence: Secondary | ICD-10-CM | POA: Diagnosis not present

## 2019-02-09 DIAGNOSIS — I120 Hypertensive chronic kidney disease with stage 5 chronic kidney disease or end stage renal disease: Secondary | ICD-10-CM | POA: Diagnosis not present

## 2019-02-09 DIAGNOSIS — Z7901 Long term (current) use of anticoagulants: Secondary | ICD-10-CM | POA: Diagnosis not present

## 2019-02-09 DIAGNOSIS — N186 End stage renal disease: Secondary | ICD-10-CM | POA: Diagnosis not present

## 2019-02-09 DIAGNOSIS — Z79899 Other long term (current) drug therapy: Secondary | ICD-10-CM | POA: Diagnosis not present

## 2019-02-09 DIAGNOSIS — T82838A Hemorrhage of vascular prosthetic devices, implants and grafts, initial encounter: Secondary | ICD-10-CM | POA: Diagnosis present

## 2019-02-09 DIAGNOSIS — J449 Chronic obstructive pulmonary disease, unspecified: Secondary | ICD-10-CM | POA: Diagnosis not present

## 2019-02-09 DIAGNOSIS — T829XXA Unspecified complication of cardiac and vascular prosthetic device, implant and graft, initial encounter: Secondary | ICD-10-CM | POA: Insufficient documentation

## 2019-02-09 DIAGNOSIS — Z992 Dependence on renal dialysis: Secondary | ICD-10-CM | POA: Diagnosis not present

## 2019-02-09 DIAGNOSIS — Z85048 Personal history of other malignant neoplasm of rectum, rectosigmoid junction, and anus: Secondary | ICD-10-CM | POA: Diagnosis not present

## 2019-02-09 DIAGNOSIS — Y828 Other medical devices associated with adverse incidents: Secondary | ICD-10-CM | POA: Insufficient documentation

## 2019-02-10 ENCOUNTER — Emergency Department (HOSPITAL_COMMUNITY)
Admission: EM | Admit: 2019-02-10 | Discharge: 2019-02-10 | Disposition: A | Discharge status: Home / Self Care | Payer: Medicare Other | Attending: Emergency Medicine | Admitting: Emergency Medicine

## 2019-02-10 ENCOUNTER — Encounter (HOSPITAL_COMMUNITY): Payer: Self-pay | Admitting: Emergency Medicine

## 2019-02-10 ENCOUNTER — Other Ambulatory Visit: Payer: Self-pay

## 2019-02-10 DIAGNOSIS — T829XXA Unspecified complication of cardiac and vascular prosthetic device, implant and graft, initial encounter: Secondary | ICD-10-CM

## 2019-02-10 DIAGNOSIS — E876 Hypokalemia: Secondary | ICD-10-CM | POA: Diagnosis not present

## 2019-02-10 DIAGNOSIS — N186 End stage renal disease: Secondary | ICD-10-CM | POA: Diagnosis not present

## 2019-02-10 DIAGNOSIS — D631 Anemia in chronic kidney disease: Secondary | ICD-10-CM | POA: Diagnosis not present

## 2019-02-10 DIAGNOSIS — D689 Coagulation defect, unspecified: Secondary | ICD-10-CM | POA: Diagnosis not present

## 2019-02-10 DIAGNOSIS — Z992 Dependence on renal dialysis: Secondary | ICD-10-CM | POA: Diagnosis not present

## 2019-02-10 DIAGNOSIS — R197 Diarrhea, unspecified: Secondary | ICD-10-CM | POA: Diagnosis not present

## 2019-02-10 DIAGNOSIS — N2581 Secondary hyperparathyroidism of renal origin: Secondary | ICD-10-CM | POA: Diagnosis not present

## 2019-02-10 LAB — CBC
HCT: 35 % — ABNORMAL LOW (ref 39.0–52.0)
Hemoglobin: 11.2 g/dL — ABNORMAL LOW (ref 13.0–17.0)
MCH: 30.6 pg (ref 26.0–34.0)
MCHC: 32 g/dL (ref 30.0–36.0)
MCV: 95.6 fL (ref 80.0–100.0)
Platelets: 168 10*3/uL (ref 150–400)
RBC: 3.66 MIL/uL — ABNORMAL LOW (ref 4.22–5.81)
RDW: 14.9 % (ref 11.5–15.5)
WBC: 7.6 10*3/uL (ref 4.0–10.5)
nRBC: 0 % (ref 0.0–0.2)

## 2019-02-10 NOTE — ED Provider Notes (Signed)
[facility] DEPT Provider Note: Georgena Spurling, MD, FACEP  CSN: [id number] MRN: [id number] ARRIVAL: 02/09/19 at 2303 ROOM: RESA/RESA   CHIEF COMPLAINT  Fistula bleeding left arm   HISTORY OF PRESENT ILLNESS  02/10/19 3:57 AM Jason Cotta Sr. is a 73 y.o. male who was dialyzed Tuesday (2 days ago).  When he changed the bandage on his left upper arm dialysis fistula yesterday evening about 6 PM it began bleeding.  He states the bleeding was severe and pulsatile.  His son, who is a Emergency planning/management officer but has also had Production manager, applied a bulky dressing.  The dressing is partially soaked with blood but has apparently stopped bleeding per the patient.  He has been somewhat lightheaded but denies chest pain or shortness of breath.  He denies pain at the bleeding site.   Past Medical History:  Diagnosis Date  . Anemia   . Arthritis   . Chronic renal insufficiency 07/31/2011  . COPD (chronic obstructive pulmonary disease) ([facility])    pt denies  . CVA (cerebral vascular accident) ([facility]) 06/2018  . Deficiency anemia 07/20/2012  . Diverticulosis   . Edema 09/21/2010   Qualifier: Diagnosis of  By: Jerold Coombe    . ERECTILE DYSFUNCTION, MILD 05/11/2007   Qualifier: Diagnosis of  By: Sherren Mocha MD, Jory Ee   . Exertional dyspnea 10/30/2015  . GOUT 05/11/2007   Qualifier: Diagnosis of  By: Sherren Mocha MD, Jory Ee   . Hereditary and idiopathic peripheral neuropathy 09/14/2014  . History of radiation therapy 02/21/13-03/31/13   rectum 50.4Gy total dose  . Hypersomnia 10/30/2015  . Hypertension   . Lung nodule 10/30/2015  . OAB (overactive bladder) 01/09/2016  . Obesity (BMI 30.0-34.9) 10/30/2015  . Obstructive sleep apnea 07/29/2010   HST 11/2015 AHI 54.  On autocpap  >> 10 cm   . Peripheral edema 09/19/2013  . rectal ca dx'd 02/01/13   rectal. Radiation and chemotherapy- remains on chemotherapy-next tx. 08-22-13.   . S/P partial lobectomy of lung 11/20/2015  . S/P thoracotomy   . SUI (stress urinary  incontinence), male 01/09/2016    Past Surgical History:  Procedure Laterality Date  . A/V FISTULAGRAM Left 06/14/2018   Procedure: A/V FISTULAGRAM;  Surgeon: Waynetta Sandy, MD;  Location: [facility];  Service: Cardiovascular;  Laterality: Left;  . AV FISTULA PLACEMENT Left 03/24/2016   Procedure: CREATION OF LEFT BRACIOCEPHALIC ARTERIOVENOUS (AV) FISTULA;  Surgeon: Elam Dutch, MD;  Location: [facility];  Service: Vascular;  Laterality: Left;  . BIOPSY  09/17/2018   Procedure: BIOPSY;  Surgeon: Doran Stabler, MD;  Location: [facility];  Service: Gastroenterology;;  . COLON SURGERY  05/20/2013  . COLONOSCOPY  2010   [facility]  . COLONOSCOPY WITH PROPOFOL N/A 09/17/2018   Procedure: COLONOSCOPY WITH PROPOFOL;  Surgeon: Doran Stabler, MD;  Location: [facility];  Service: Gastroenterology;  Laterality: N/A;  . ESOPHAGOGASTRODUODENOSCOPY (EGD) WITH PROPOFOL N/A 09/17/2018   Procedure: ESOPHAGOGASTRODUODENOSCOPY (EGD) WITH PROPOFOL;  Surgeon: Doran Stabler, MD;  Location: [facility];  Service: Gastroenterology;  Laterality: N/A;  . EUS N/A 02/03/2013   Procedure: LOWER ENDOSCOPIC ULTRASOUND (EUS);  Surgeon: Milus Banister, MD;  Location: [facility];  Service: Endoscopy;  Laterality: N/A;  . ILEOSTOMY CLOSURE N/A 08/24/2013   Procedure: CLOSURE OF LOOP ILEOSTOMY ;  Surgeon: Leighton Ruff, MD;  Location: [facility];  Service: General;  Laterality: N/A;  . IR ANGIO VERTEBRAL SEL SUBCLAVIAN INNOMINATE BILAT MOD SED  06/08/2018  . IR CT HEAD LTD  06/08/2018  . IR INTRAVSC STENT CERV CAROTID W/O EMB-PROT MOD SED INC ANGIO  06/08/2018  . IR PERCUTANEOUS ART THROMBECTOMY/INFUSION INTRACRANIAL INC DIAG ANGIO  06/08/2018  . LAPAROSCOPIC LOW ANTERIOR RESECTION N/A 05/20/2013   Procedure: LAPAROSCOPIC LOW ANTERIOR RESECTION WITH SPLENIC FLEXURE MOBILIZATION;  Surgeon: Leighton Ruff, MD;  Location: [facility];  Service: General;  Laterality: N/A;  . OSTOMY N/A 05/20/2013    Procedure: diverting OSTOMY;  Surgeon: Leighton Ruff, MD;  Location: [facility];  Service: General;  Laterality: N/A;  . PERIPHERAL VASCULAR INTERVENTION Left 06/14/2018   Procedure: PERIPHERAL VASCULAR INTERVENTION;  Surgeon: Waynetta Sandy, MD;  Location: [facility];  Service: Cardiovascular;  Laterality: Left;  AVF on the Left  . RADIOLOGY WITH ANESTHESIA N/A 06/08/2018   Procedure: IR WITH ANESTHESIA;  Surgeon: Radiologist, Medication, MD;  Location: [facility];  Service: Radiology;  Laterality: N/A;  . VIDEO ASSISTED THORACOSCOPY (VATS)/WEDGE RESECTION Left 11/20/2015   Procedure: VIDEO ASSISTED THORACOSCOPY (VATS)/LUNG RESECTION;  Surgeon: [name], MD;  Location: [facility];  Service: Thoracic;  Laterality: Left;  Marland Kitchen VIDEO BRONCHOSCOPY N/A 11/20/2015   Procedure: VIDEO BRONCHOSCOPY;  Surgeon: [name], MD;  Location: [facility];  Service: Thoracic;  Laterality: N/A;    Family History  Problem Relation Age of Onset  . Stroke Mother   . Hypertension Mother   . Stroke Father   . Hypertension Father   . Hypertension Other   . Colon cancer Neg Hx     Social History   Tobacco Use  . Smoking status: Former Smoker    Packs/day: 0.50    Years: 10.00    Pack years: 5.00    Types: Cigarettes    Quit date: 07/07/1977    Years since quitting: 41.6  . Smokeless tobacco: Never Used  Substance Use Topics  . Alcohol use: Yes    Alcohol/week: 0.0 standard drinks    Comment: drinks occasionally  . Drug use: No    Prior to Admission medications   Medication Sig Start Date End Date Taking? Authorizing Provider  acetaminophen (TYLENOL) 325 MG tablet Take 1-2 tablets (325-650 mg total) by mouth every 4 (four) hours as needed for mild pain. 06/29/18   Love, Ivan Anchors, PA-C  allopurinol (ZYLOPRIM) 100 MG tablet Take 1 tablet by mouth once daily 11/03/18   [name], MD  atorvastatin (LIPITOR) 20 MG tablet TAKE 1 TABLET BY MOUTH  DAILY AT 6 PM. 02/09/19   [name], MD   budesonide (ENTOCORT EC) 3 MG 24 hr capsule Take 3 capsules (9 mg total) by mouth daily. 01/14/19   Doran Stabler, MD  calcitRIOL (ROCALTROL) 0.25 MCG capsule TAKE 1 CAPSULE BY MOUTH ONCE DAILY 08/29/18   [name], MD  Cholecalciferol (VITAMIN D-3) 125 MCG (5000 UT) TABS Take 5,000 Units by mouth daily.    [provider]  diphenoxylate-atropine (LOMOTIL) 2.5-0.025 MG tablet Take 2 tablets by mouth 4 (four) times daily as needed for diarrhea or loose stools. 10/05/18   Doran Stabler, MD  ferric citrate (AURYXIA) 1 GM 210 MG(Fe) tablet Take 420 mg by mouth 3 (three) times daily with meals.    [provider]  gabapentin (NEURONTIN) 300 MG capsule TAKE 1 CAPSULE BY MOUTH EVERY DAY AT BEDTIME. PT NEEDS APPT PRIOR TO FUTURE REFILLS 10/18/18   Wallene Huh, DPM  lipase/protease/amylase (CREON) 36000 UNITS CPEP capsule Take 1 capsule (36,000  Units total) by mouth 3 (three) times daily with meals. 11/22/18   Doran Stabler, MD  lipase/protease/amylase (CREON) 36000 UNITS CPEP capsule Take 1 capsule (36,000 Units total) by mouth 3 (three) times daily with meals. 12/24/18   Doran Stabler, MD  metoprolol succinate (TOPROL-XL) 25 MG 24 hr tablet Take 1 tablet (25 mg total) by mouth daily. 09/20/18   [name], MD  multivitamin (RENA-VIT) TABS tablet TAKE 1 TABLET BY MOUTH AT BEDTIME 08/09/18   [name], MD  pantoprazole (PROTONIX) 40 MG tablet Take 40 mg by mouth daily. 10/23/18   [provider]  polycarbophil (FIBERCON) 625 MG tablet Take 2 tablets (1,250 mg total) by mouth 2 (two) times daily. 06/29/18   Love, Ivan Anchors, PA-C  warfarin (COUMADIN) 5 MG tablet TAKE 1 TABLET DAILY OR AS DIRECTED BY ANTICOAGULATION CLINIC 01/23/19   [name], MD    Allergies Nsaids, Lactose intolerance (gi), and Amlodipine   REVIEW OF SYSTEMS  Negative except as noted here or in the History of Present Illness.   PHYSICAL EXAMINATION  Initial Vital Signs  Blood pressure 108/68, pulse 83, temperature 99 F (37.2 C), temperature source Oral, resp. rate 14, height 5\' 8"  (1.727 m), weight 61.2 kg, SpO2 (!) 86 %.  Examination General: Well-developed, well-nourished male in no acute distress; appearance consistent with age of record HENT: normocephalic; atraumatic Eyes: pupils equal, round and reactive to light; extraocular muscles intact; arcus senilis bilaterally Neck: supple Heart: regular rate and rhythm Lungs: clear to auscultation bilaterally Abdomen: soft; nondistended; nontender; bowel sounds present Extremities: No deformity; full range of motion; pulses normal; bulky dressing of left upper arm and elbow, on removal of dressing the patient's dialysis fistula is patent with pulse and palpable thrill, distal radial pulse is +2, no active hemorrhage but small spot of blood at the site:    Neurologic: Awake, alert and oriented; motor function intact in all extremities and symmetric; no facial droop Skin: Warm and dry Psychiatric: Normal mood and affect   RESULTS  Summary of this visit's results, reviewed by myself:   EKG Interpretation  Date/Time:    Ventricular Rate:    PR Interval:    QRS Duration:   QT Interval:    QTC Calculation:   R Axis:     Text Interpretation:        Laboratory Studies: Results for orders placed or performed during the hospital encounter of 02/10/19 (from the past 24 hour(s))  CBC     Status: Abnormal   Collection Time: 02/10/19  4:11 AM  Result Value Ref Range   WBC 7.6 4.0 - 10.5 K/uL   RBC 3.66 (L) 4.22 - 5.81 MIL/uL   Hemoglobin 11.2 (L) 13.0 - 17.0 g/dL   HCT 35.0 (L) 39.0 - 52.0 %   MCV 95.6 80.0 - 100.0 fL   MCH 30.6 26.0 - 34.0 pg   MCHC 32.0 30.0 - 36.0 g/dL   RDW 14.9 11.5 - 15.5 %   Platelets 168 150 - 400 K/uL   nRBC 0.0 0.0 - 0.2 %   Imaging Studies: No results found.  ED COURSE and MDM  Nursing notes and initial vitals signs, including pulse oximetry, reviewed.  Vitals:    02/10/19 0357 02/10/19 0359 02/10/19 0400 02/10/19 0441  BP: 92/60 (!) 97/58 (!) 129/59 (!) 110/49  Pulse: 74 72 79 65  Resp: 18 18 19 16   Temp:  98.4 F (36.9 C)  98.3 F (36.8  C)  TempSrc:  Oral  Oral  SpO2: 98% 100% 100% 100%  Weight:      Height:       4:05 AM Patient's dialysis fistula dressed with combat gauze but not so tight as to block flow through the fistula.  The patient has dialysis later today.  4:57 AM Patient's wound dressing dry with no sign of bleedthrough.  Will have patient follow-up with dialysis later today as scheduled.  PROCEDURES    ED DIAGNOSES     ICD-10-CM   1. Complication of arteriovenous dialysis fistula, initial encounter  T82.9XXA        Kenley Rettinger, Jenny Reichmann, MD 02/10/19 [phone]

## 2019-02-11 ENCOUNTER — Ambulatory Visit (INDEPENDENT_AMBULATORY_CARE_PROVIDER_SITE_OTHER): Payer: Medicare Other | Admitting: General Practice

## 2019-02-11 DIAGNOSIS — I4891 Unspecified atrial fibrillation: Secondary | ICD-10-CM

## 2019-02-11 DIAGNOSIS — Z7901 Long term (current) use of anticoagulants: Secondary | ICD-10-CM | POA: Diagnosis not present

## 2019-02-11 LAB — POCT INR: INR: 8 — AB (ref 2.0–3.0)

## 2019-02-11 NOTE — Patient Instructions (Addendum)
Pre visit review using our clinic review tool, if applicable. No additional management support is needed unless otherwise documented below in the visit note.  INR is 8.0 today.  Stop coumadin!  Re-check on Monday.  Go to ER if any unusual bleeding occurs.

## 2019-02-12 DIAGNOSIS — N186 End stage renal disease: Secondary | ICD-10-CM | POA: Diagnosis not present

## 2019-02-12 DIAGNOSIS — N2581 Secondary hyperparathyroidism of renal origin: Secondary | ICD-10-CM | POA: Diagnosis not present

## 2019-02-12 DIAGNOSIS — R197 Diarrhea, unspecified: Secondary | ICD-10-CM | POA: Diagnosis not present

## 2019-02-12 DIAGNOSIS — D689 Coagulation defect, unspecified: Secondary | ICD-10-CM | POA: Diagnosis not present

## 2019-02-12 DIAGNOSIS — D631 Anemia in chronic kidney disease: Secondary | ICD-10-CM | POA: Diagnosis not present

## 2019-02-12 DIAGNOSIS — E876 Hypokalemia: Secondary | ICD-10-CM | POA: Diagnosis not present

## 2019-02-12 DIAGNOSIS — Z992 Dependence on renal dialysis: Secondary | ICD-10-CM | POA: Diagnosis not present

## 2019-02-14 ENCOUNTER — Ambulatory Visit (INDEPENDENT_AMBULATORY_CARE_PROVIDER_SITE_OTHER): Payer: Medicare Other | Admitting: General Practice

## 2019-02-14 ENCOUNTER — Other Ambulatory Visit: Payer: Self-pay

## 2019-02-14 DIAGNOSIS — I4891 Unspecified atrial fibrillation: Secondary | ICD-10-CM

## 2019-02-14 DIAGNOSIS — Z7901 Long term (current) use of anticoagulants: Secondary | ICD-10-CM

## 2019-02-14 LAB — POCT INR: INR: 2.8 (ref 2.0–3.0)

## 2019-02-14 NOTE — Patient Instructions (Addendum)
Pre visit review using our clinic review tool, if applicable. No additional management support is needed unless otherwise documented below in the visit note.  Take 1 (5 mg) tablet daily.  Re-check in 1 week.

## 2019-02-15 ENCOUNTER — Telehealth: Payer: Self-pay | Admitting: Gastroenterology

## 2019-02-15 DIAGNOSIS — Z992 Dependence on renal dialysis: Secondary | ICD-10-CM | POA: Diagnosis not present

## 2019-02-15 DIAGNOSIS — N2581 Secondary hyperparathyroidism of renal origin: Secondary | ICD-10-CM | POA: Diagnosis not present

## 2019-02-15 DIAGNOSIS — R197 Diarrhea, unspecified: Secondary | ICD-10-CM | POA: Diagnosis not present

## 2019-02-15 DIAGNOSIS — N186 End stage renal disease: Secondary | ICD-10-CM | POA: Diagnosis not present

## 2019-02-15 DIAGNOSIS — D689 Coagulation defect, unspecified: Secondary | ICD-10-CM | POA: Diagnosis not present

## 2019-02-15 DIAGNOSIS — E876 Hypokalemia: Secondary | ICD-10-CM | POA: Diagnosis not present

## 2019-02-15 DIAGNOSIS — D631 Anemia in chronic kidney disease: Secondary | ICD-10-CM | POA: Diagnosis not present

## 2019-02-15 NOTE — Telephone Encounter (Signed)
Jason [name] has been notified and aware that I have requested some samples as we have none on hand at this time.

## 2019-02-17 DIAGNOSIS — E876 Hypokalemia: Secondary | ICD-10-CM | POA: Diagnosis not present

## 2019-02-17 DIAGNOSIS — D689 Coagulation defect, unspecified: Secondary | ICD-10-CM | POA: Diagnosis not present

## 2019-02-17 DIAGNOSIS — D631 Anemia in chronic kidney disease: Secondary | ICD-10-CM | POA: Diagnosis not present

## 2019-02-17 DIAGNOSIS — Z992 Dependence on renal dialysis: Secondary | ICD-10-CM | POA: Diagnosis not present

## 2019-02-17 DIAGNOSIS — N186 End stage renal disease: Secondary | ICD-10-CM | POA: Diagnosis not present

## 2019-02-17 DIAGNOSIS — R197 Diarrhea, unspecified: Secondary | ICD-10-CM | POA: Diagnosis not present

## 2019-02-17 DIAGNOSIS — N2581 Secondary hyperparathyroidism of renal origin: Secondary | ICD-10-CM | POA: Diagnosis not present

## 2019-02-18 ENCOUNTER — Ambulatory Visit: Payer: Medicare Other | Admitting: Oncology

## 2019-02-18 ENCOUNTER — Other Ambulatory Visit: Payer: Medicare Other

## 2019-02-19 DIAGNOSIS — Z992 Dependence on renal dialysis: Secondary | ICD-10-CM | POA: Diagnosis not present

## 2019-02-19 DIAGNOSIS — E876 Hypokalemia: Secondary | ICD-10-CM | POA: Diagnosis not present

## 2019-02-19 DIAGNOSIS — N186 End stage renal disease: Secondary | ICD-10-CM | POA: Diagnosis not present

## 2019-02-19 DIAGNOSIS — R197 Diarrhea, unspecified: Secondary | ICD-10-CM | POA: Diagnosis not present

## 2019-02-19 DIAGNOSIS — N2581 Secondary hyperparathyroidism of renal origin: Secondary | ICD-10-CM | POA: Diagnosis not present

## 2019-02-19 DIAGNOSIS — D689 Coagulation defect, unspecified: Secondary | ICD-10-CM | POA: Diagnosis not present

## 2019-02-19 DIAGNOSIS — D631 Anemia in chronic kidney disease: Secondary | ICD-10-CM | POA: Diagnosis not present

## 2019-02-21 ENCOUNTER — Ambulatory Visit: Payer: Medicare Other

## 2019-02-22 DIAGNOSIS — D631 Anemia in chronic kidney disease: Secondary | ICD-10-CM | POA: Diagnosis not present

## 2019-02-22 DIAGNOSIS — E876 Hypokalemia: Secondary | ICD-10-CM | POA: Diagnosis not present

## 2019-02-22 DIAGNOSIS — R197 Diarrhea, unspecified: Secondary | ICD-10-CM | POA: Diagnosis not present

## 2019-02-22 DIAGNOSIS — D689 Coagulation defect, unspecified: Secondary | ICD-10-CM | POA: Diagnosis not present

## 2019-02-22 DIAGNOSIS — N2581 Secondary hyperparathyroidism of renal origin: Secondary | ICD-10-CM | POA: Diagnosis not present

## 2019-02-22 DIAGNOSIS — Z992 Dependence on renal dialysis: Secondary | ICD-10-CM | POA: Diagnosis not present

## 2019-02-22 DIAGNOSIS — N186 End stage renal disease: Secondary | ICD-10-CM | POA: Diagnosis not present

## 2019-02-23 ENCOUNTER — Other Ambulatory Visit: Payer: Self-pay

## 2019-02-23 ENCOUNTER — Ambulatory Visit (INDEPENDENT_AMBULATORY_CARE_PROVIDER_SITE_OTHER): Payer: Medicare Other | Admitting: General Practice

## 2019-02-23 DIAGNOSIS — Z7901 Long term (current) use of anticoagulants: Secondary | ICD-10-CM

## 2019-02-23 DIAGNOSIS — I4891 Unspecified atrial fibrillation: Secondary | ICD-10-CM

## 2019-02-23 LAB — POCT INR: INR: 4.2 — AB (ref 2.0–3.0)

## 2019-02-23 NOTE — Progress Notes (Signed)
I have reviewed the results and agree with this plan   

## 2019-02-23 NOTE — Patient Instructions (Signed)
Pre visit review using our clinic review tool, if applicable. No additional management support is needed unless otherwise documented below in the visit note.  Do not take coumadin tomorrow or Friday.  On Saturday start taking 1 (5 mg) tablet daily except 1/2 tablet on Monday and Thursdays.  Re-check in 2 weeks at [facility] on 9/1.

## 2019-02-24 DIAGNOSIS — D631 Anemia in chronic kidney disease: Secondary | ICD-10-CM | POA: Diagnosis not present

## 2019-02-24 DIAGNOSIS — N186 End stage renal disease: Secondary | ICD-10-CM | POA: Diagnosis not present

## 2019-02-24 DIAGNOSIS — R197 Diarrhea, unspecified: Secondary | ICD-10-CM | POA: Diagnosis not present

## 2019-02-24 DIAGNOSIS — N2581 Secondary hyperparathyroidism of renal origin: Secondary | ICD-10-CM | POA: Diagnosis not present

## 2019-02-24 DIAGNOSIS — E876 Hypokalemia: Secondary | ICD-10-CM | POA: Diagnosis not present

## 2019-02-24 DIAGNOSIS — Z992 Dependence on renal dialysis: Secondary | ICD-10-CM | POA: Diagnosis not present

## 2019-02-24 DIAGNOSIS — D689 Coagulation defect, unspecified: Secondary | ICD-10-CM | POA: Diagnosis not present

## 2019-02-25 ENCOUNTER — Telehealth: Payer: Self-pay | Admitting: Oncology

## 2019-02-25 ENCOUNTER — Other Ambulatory Visit: Payer: Self-pay

## 2019-02-25 ENCOUNTER — Inpatient Hospital Stay: Payer: Medicare Other | Admitting: Oncology

## 2019-02-25 ENCOUNTER — Inpatient Hospital Stay: Payer: Medicare Other | Attending: Oncology

## 2019-02-25 ENCOUNTER — Telehealth: Payer: Self-pay | Admitting: *Deleted

## 2019-02-25 VITALS — BP 118/54 | HR 88 | Temp 98.0°F | Resp 18 | Ht 68.0 in | Wt 147.0 lb

## 2019-02-25 DIAGNOSIS — C20 Malignant neoplasm of rectum: Secondary | ICD-10-CM | POA: Diagnosis not present

## 2019-02-25 DIAGNOSIS — D631 Anemia in chronic kidney disease: Secondary | ICD-10-CM | POA: Insufficient documentation

## 2019-02-25 DIAGNOSIS — N189 Chronic kidney disease, unspecified: Secondary | ICD-10-CM | POA: Diagnosis not present

## 2019-02-25 DIAGNOSIS — I10 Essential (primary) hypertension: Secondary | ICD-10-CM | POA: Insufficient documentation

## 2019-02-25 DIAGNOSIS — Z85048 Personal history of other malignant neoplasm of rectum, rectosigmoid junction, and anus: Secondary | ICD-10-CM | POA: Insufficient documentation

## 2019-02-25 DIAGNOSIS — Z9221 Personal history of antineoplastic chemotherapy: Secondary | ICD-10-CM | POA: Diagnosis not present

## 2019-02-25 DIAGNOSIS — Z8673 Personal history of transient ischemic attack (TIA), and cerebral infarction without residual deficits: Secondary | ICD-10-CM | POA: Diagnosis not present

## 2019-02-25 DIAGNOSIS — M109 Gout, unspecified: Secondary | ICD-10-CM | POA: Diagnosis not present

## 2019-02-25 LAB — CEA (IN HOUSE-CHCC): CEA (CHCC-In House): 1.13 ng/mL (ref 0.00–5.00)

## 2019-02-25 NOTE — Progress Notes (Signed)
[facility] OFFICE PROGRESS NOTE   Diagnosis: Rectal cancer  INTERVAL HISTORY:   Jason [name] returns as scheduled.  He continues hemodialysis.  He reports the past several months.  He is followed by Dr. Loletha Carrow and is taking Creon and Lomotil.  He continues to have 3 or 4 loose stools per day.  No bleeding.  Good appetite.  Objective:  Vital signs in last 24 hours:  Blood pressure (!) 118/54, pulse 88, temperature 98 F (36.7 C), temperature source Oral, resp. rate 18, height 5\' 8"  (1.727 m), weight 147 lb (66.7 kg), SpO2 100 %.   Limited physical examination secondary to distancing with the COVID pandemic HEENT: Neck without mass Lymphatics: No cervical, supraclavicular, axillary, or inguinal nodes GI: No hepatosplenomegaly, no mass, nontender Vascular: No leg edema   Lab Results:  Lab Results  Component Value Date   WBC 7.6 02/10/2019   HGB 11.2 (L) 02/10/2019   HCT 35.0 (L) 02/10/2019   MCV 95.6 02/10/2019   PLT 168 02/10/2019   NEUTROABS 6.0 08/06/2018    CMP  Lab Results  Component Value Date   NA 139 09/17/2018   K 4.4 09/17/2018   CL 96 (L) 07/28/2018   CO2 29 07/28/2018   GLUCOSE 96 09/17/2018   BUN 20 07/28/2018   CREATININE 5.49 (H) 07/28/2018   CALCIUM 9.9 07/28/2018   PROT 7.7 07/28/2018   ALBUMIN 3.3 (L) 07/28/2018   AST 23 07/28/2018   ALT 18 07/28/2018   ALKPHOS 149 (H) 07/28/2018   BILITOT 0.6 07/28/2018   GFRNONAA 10 (L) 07/28/2018   GFRAA 11 (L) 07/28/2018    Lab Results  Component Value Date   CEA1 1.13 02/25/2019     Medications: I have reviewed the patient's current medications.   Assessment/Plan:  1. Rectal cancer, clinical stage III (uT3, uN2).  Initiation of concurrent Xeloda and radiation on 02/21/2013, completed 03/31/2013.   CEA normal 04/25/2013   Status post laparoscopic low anterior resection with diverting loop ileostomy 05/23/2013. Final pathology showed a 3.2 cm invasive adenocarcinoma with  neoadjuvant related change. Tumor invaded into perirectal soft tissue. There was no lymphovascular or perineural invasion. 14 lymph nodes were negative for tumor. Surgical margins were negative. There was one hyperplastic polyp (ypT3, pN0).   Initiation of weekly 5-FU/leucovorin 08/01/2013   Ileostomy takedown 08/24/2013.   Weekly 5-FU/leucovorin resumed 09/22/2013.  Restaging CTs 02/20/2014 negative for evidence of recurrent disease  restaging CT scans 04/05/2015 negative for evidence of recurrent disease  Surveillance colonoscopy 03/31/2014, status post removal of of a tubular adenoma from the ascending colon  Enlarging left posterior medial upper lobe nodule on chest CT 04/05/2015 and 07/12/2015  PET scan 10/22/2015 with a hypermetabolic nodule in the medialaspect of the leftupper lobe, other lung nodules stable  Left lung wedge resection 11/20/2015 confirmed a 1 cm focus of metastatic adenocarcinoma of colorectal origin, resection margins and multiple lymph nodes negative  CT scans 03/17/2016-bandlike scarring along the left upper lobe nodule resection with a soft tissue density component; calcified bilateral pleural plaques; no findings of recurrence of the abdomen or pelvis.  CTs 09/30/2016-negative for progressive disease  CTs 06/05/2017-new pulmonary nodule right upper lobe. Stable pleural nodularity within the left and right lung. Stable nodular thickening along the left upper lobe resection margin. No evidence of metastasis in the abdomen/pelvis. Interval enlargement of a solid lesion in the medial aspect of the right kidney most consistent with a benign lesion based on a previous PET scan, the "new"  right upper lobe nodule was present on a PET scan 10/22/2015 and similar in size-favored to be benign  CTs 12/01/2017-no evidence of progressive rectal cancer, stable clustered right upper lobe pulmonary nodules, no new nodules  CTs 08/11/2018- no evidence of metastatic disease  in the chest, abdomen or pelvis.  Circumferential bladder wall thickening with subtle perivesicular edema, new in the interval.  Index nodule right upper lobe and soft tissue fullness associated with the inferior aspect of the left upper lobe staple line are stable.  Multiple bilateral renal lesions of varying size and attenuation, some compatible simple cyst while others are likely cyst complicated by proteinaceous debris or hemorrhage.  Some of the intermediate attenuation lesions although stable in size cannot be fully characterized.  Avascular necrosis left femoral head.  2. Markedly elevated pretreatment CEA 3. Indeterminate pulmonary/subpleural nodules and right hepatic dome lesion. Unchanged on the CT 04/25/2013, unchanged nodular lung lesions on the CT 02/20/2014, see follow-up CTs/PET above 4. Renal insufficiency. Acute on chronic renal failure during hospitalization November/December 2014. Improved following the ileostomy takedown.He is followed by nephrology. 5. History of Anemia secondary chemotherapy, chronic disease, and renal insufficiency 6. Hypertension. 7. Gout. 8. Prolonged postoperative ileus following laparoscopic low anterior resection with diverting loop ileostomy 05/23/2013. 9. C. difficile-positive 09/08/2013-status post Flagyl with improvement. Recurrent diarrhea following completion of Flagyl. Stool positive for C. difficile on 09/24/2013. He completed treatment with vancomycin. The diarrhea resolved. 10. Left greater than right low leg edema. Venous Doppler 09/26/2013 negative for left leg DVT. 11. Rectal urgency/incontinence-Improved with participation in the pelvic physical therapy clinic. 12. CVA 06/08/2018 presenting with left-sided weakness and severely dysarthric speech.  Stroke felt to be due to large vessel atherosclerosis.   Disposition: Jason [name] is in clinical remission from rectal cancer.  He will return for an office visit and CEA in 6 months.  He will  continue follow-up with Dr. Loletha Carrow for evaluation and manage of diarrhea.  Betsy Coder, MD  02/25/2019  3:05 PM

## 2019-02-25 NOTE — Telephone Encounter (Signed)
-----   Message from [name], MD sent at 02/25/2019  1:22 PM EDT ----- Please call patient, CEA is normal, follow-up as scheduled

## 2019-02-25 NOTE — Telephone Encounter (Signed)
Appointments scheduled per 08/21 los, patient has been called and notified. Patient would like a calender mailed.

## 2019-02-25 NOTE — Telephone Encounter (Signed)
TCT patient regarding CEA results from today.  Spoke with patient and informed that his CEA is normal.  Advised to follow up as scheduled. Pt voiced understanding. No questions or concerns.

## 2019-02-26 ENCOUNTER — Encounter (HOSPITAL_COMMUNITY): Payer: Self-pay | Admitting: *Deleted

## 2019-02-26 ENCOUNTER — Emergency Department (HOSPITAL_COMMUNITY): Payer: Medicare Other

## 2019-02-26 ENCOUNTER — Emergency Department (HOSPITAL_COMMUNITY)
Admission: EM | Admit: 2019-02-26 | Discharge: 2019-02-26 | Disposition: A | Discharge status: Home / Self Care | Payer: Medicare Other | Attending: Emergency Medicine | Admitting: Emergency Medicine

## 2019-02-26 ENCOUNTER — Other Ambulatory Visit: Payer: Self-pay

## 2019-02-26 DIAGNOSIS — D689 Coagulation defect, unspecified: Secondary | ICD-10-CM | POA: Diagnosis not present

## 2019-02-26 DIAGNOSIS — Z85048 Personal history of other malignant neoplasm of rectum, rectosigmoid junction, and anus: Secondary | ICD-10-CM | POA: Insufficient documentation

## 2019-02-26 DIAGNOSIS — N2581 Secondary hyperparathyroidism of renal origin: Secondary | ICD-10-CM | POA: Diagnosis not present

## 2019-02-26 DIAGNOSIS — D631 Anemia in chronic kidney disease: Secondary | ICD-10-CM | POA: Diagnosis not present

## 2019-02-26 DIAGNOSIS — E876 Hypokalemia: Secondary | ICD-10-CM | POA: Diagnosis not present

## 2019-02-26 DIAGNOSIS — K5730 Diverticulosis of large intestine without perforation or abscess without bleeding: Secondary | ICD-10-CM | POA: Diagnosis not present

## 2019-02-26 DIAGNOSIS — R197 Diarrhea, unspecified: Secondary | ICD-10-CM | POA: Insufficient documentation

## 2019-02-26 DIAGNOSIS — I120 Hypertensive chronic kidney disease with stage 5 chronic kidney disease or end stage renal disease: Secondary | ICD-10-CM | POA: Insufficient documentation

## 2019-02-26 DIAGNOSIS — Z8673 Personal history of transient ischemic attack (TIA), and cerebral infarction without residual deficits: Secondary | ICD-10-CM | POA: Diagnosis not present

## 2019-02-26 DIAGNOSIS — Z992 Dependence on renal dialysis: Secondary | ICD-10-CM | POA: Insufficient documentation

## 2019-02-26 DIAGNOSIS — R109 Unspecified abdominal pain: Secondary | ICD-10-CM | POA: Diagnosis not present

## 2019-02-26 DIAGNOSIS — Z87891 Personal history of nicotine dependence: Secondary | ICD-10-CM | POA: Insufficient documentation

## 2019-02-26 DIAGNOSIS — Z79899 Other long term (current) drug therapy: Secondary | ICD-10-CM | POA: Diagnosis not present

## 2019-02-26 DIAGNOSIS — N186 End stage renal disease: Secondary | ICD-10-CM | POA: Diagnosis not present

## 2019-02-26 LAB — CBC
HCT: 31.6 % — ABNORMAL LOW (ref 39.0–52.0)
Hemoglobin: 10.3 g/dL — ABNORMAL LOW (ref 13.0–17.0)
MCH: 31.8 pg (ref 26.0–34.0)
MCHC: 32.6 g/dL (ref 30.0–36.0)
MCV: 97.5 fL (ref 80.0–100.0)
Platelets: 217 10*3/uL (ref 150–400)
RBC: 3.24 MIL/uL — ABNORMAL LOW (ref 4.22–5.81)
RDW: 15.9 % — ABNORMAL HIGH (ref 11.5–15.5)
WBC: 6.1 10*3/uL (ref 4.0–10.5)
nRBC: 0 % (ref 0.0–0.2)

## 2019-02-26 LAB — LIPASE, BLOOD: Lipase: 69 U/L — ABNORMAL HIGH (ref 11–51)

## 2019-02-26 LAB — COMPREHENSIVE METABOLIC PANEL
ALT: 14 U/L (ref 0–44)
AST: 19 U/L (ref 15–41)
Albumin: 3.8 g/dL (ref 3.5–5.0)
Alkaline Phosphatase: 104 U/L (ref 38–126)
Anion gap: 15 (ref 5–15)
BUN: 23 mg/dL (ref 8–23)
CO2: 30 mmol/L (ref 22–32)
Calcium: 10.2 mg/dL (ref 8.9–10.3)
Chloride: 95 mmol/L — ABNORMAL LOW (ref 98–111)
Creatinine, Ser: 7.13 mg/dL — ABNORMAL HIGH (ref 0.61–1.24)
GFR calc Af Amer: 8 mL/min — ABNORMAL LOW (ref >60)
GFR calc non Af Amer: 7 mL/min — ABNORMAL LOW (ref >60)
Glucose, Bld: 89 mg/dL (ref 70–99)
Potassium: 3.5 mmol/L (ref 3.5–5.1)
Sodium: 140 mmol/L (ref 135–145)
Total Bilirubin: 0.8 mg/dL (ref 0.3–1.2)
Total Protein: 7.3 g/dL (ref 6.5–8.1)

## 2019-02-26 MED ORDER — SODIUM CHLORIDE 0.9% FLUSH: 3.0000 mL | Freq: Once | INTRAVENOUS | Status: DC

## 2019-02-26 NOTE — ED Triage Notes (Signed)
Pt reporting diarrhea "for months". Says tonight the abdominal pain was too bad and he is having diarrhea too much. Says he talked to his GI doctor and was prescribed medicine for the same, its not helping. Due for dialysis today.

## 2019-02-26 NOTE — Discharge Instructions (Addendum)
Continue taking your Lomotil and Viberzi as prescribed for your diarrhea.  Please follow-up with your GI doctor this week for recheck.  Please make them aware that there was a mass seen between your stomach and small intestine on your CT today.  The radiologist recommends that you have an upper endoscopy to assess this.  You also continue to have lesions on your kidneys.  The radiologist recommends an MRI to assess this.  I recommend speaking with your oncologist or primary care provider about this.  Please return the emergency department if you develop any new or worsening symptoms.

## 2019-02-26 NOTE — ED Provider Notes (Signed)
[facility] EMERGENCY DEPARTMENT Provider Note   CSN: [id number] Arrival date & time: 02/26/19  8295     History   Chief Complaint Chief Complaint  Patient presents with   Diarrhea    HPI GIANG HEMME Sr. is a 73 y.o. male with history of hypertension, CVA, ESRD on dialysis TThSat, rectal cancer who presents with increased diarrhea and new abdominal pain for the past 2 to 3 days.  Patient states he has had diarrhea for the past several months related to his rectal cancer.  He reports he was having 1-2 episodes daily, but now he is having 3-4 episodes.  He is having abdominal pain after he has the diarrhea.  He reports some abdominal pain now.  He denies any bloody stools, fever, chest pain, shortness of breath, urinary symptoms.  He is followed by oncology and gastroenterology.  He has been taking Lomotil and Creon.     HPI  Past Medical History:  Diagnosis Date   Anemia    Arthritis    Chronic renal insufficiency 07/31/2011   COPD (chronic obstructive pulmonary disease) (HCC)    pt denies   CVA (cerebral vascular accident) ([place]) 06/2018   Deficiency anemia 07/20/2012   Diverticulosis    Edema 09/21/2010   Qualifier: Diagnosis of  By: Jerold Coombe     ERECTILE DYSFUNCTION, MILD 05/11/2007   Qualifier: Diagnosis of  By: Sherren Mocha MD, Jory Ee    Exertional dyspnea 10/30/2015   GOUT 05/11/2007   Qualifier: Diagnosis of  By: Sherren Mocha MD, Jory Ee    Hereditary and idiopathic peripheral neuropathy 09/14/2014   History of radiation therapy 02/21/13-03/31/13   rectum 50.4Gy total dose   Hypersomnia 10/30/2015   Hypertension    Lung nodule 10/30/2015   OAB (overactive bladder) 01/09/2016   Obesity (BMI 30.0-34.9) 10/30/2015   Obstructive sleep apnea 07/29/2010   HST 11/2015 AHI 54.  On autocpap  >> 10 cm    Peripheral edema 09/19/2013   rectal ca dx'd 02/01/13   rectal. Radiation and chemotherapy- remains on chemotherapy-next tx. 08-22-13.    S/P  partial lobectomy of lung 11/20/2015   S/P thoracotomy    SUI (stress urinary incontinence), male 01/09/2016    Patient Active Problem List   Diagnosis Date Noted   Long term (current) use of anticoagulants 12/31/2018   Atrial fibrillation, unspecified type ([place]) 12/22/2018   Longstanding persistent atrial fibrillation 09/20/2018   Functional diarrhea 07/14/2018   Benign essential HTN    Hyperglycemia    ESRD on dialysis ([place]) 06/16/2018   Mild malnutrition ([place]) 06/16/2018   Stroke due to embolism ([place]) 06/16/2018   CVA (cerebral vascular accident) ([place]) 06/08/2018   CHF (congestive heart failure) ([place]) 05/08/2018   Hyperlipidemia LDL goal <70 09/23/2017   OAB (overactive bladder) 01/09/2016   SUI (stress urinary incontinence), male 01/09/2016   Hypersomnia 10/30/2015   Obesity (BMI 30.0-34.9) 10/30/2015   Routine general medical examination at a health care facility 06/04/2015   Medicare annual wellness visit, subsequent 06/04/2015   Hereditary and idiopathic peripheral neuropathy 09/14/2014   Rectal cancer ([place]) 02/01/2013   Obstructive sleep apnea 07/29/2010   GOUT 05/11/2007   ERECTILE DYSFUNCTION, MILD 05/11/2007   Essential hypertension 05/11/2007    Past Surgical History:  Procedure Laterality Date   A/V FISTULAGRAM Left 06/14/2018   Procedure: A/V FISTULAGRAM;  Surgeon: Waynetta Sandy, MD;  Location: [facility];  Service: Cardiovascular;  Laterality: Left;   AV FISTULA PLACEMENT Left 03/24/2016  Procedure: CREATION OF LEFT BRACIOCEPHALIC ARTERIOVENOUS (AV) FISTULA;  Surgeon: Elam Dutch, MD;  Location: [facility];  Service: Vascular;  Laterality: Left;   BIOPSY  09/17/2018   Procedure: BIOPSY;  Surgeon: Doran Stabler, MD;  Location: [facility];  Service: Gastroenterology;;   COLON SURGERY  05/20/2013   COLONOSCOPY  2010   [facility]   COLONOSCOPY WITH PROPOFOL N/A 09/17/2018   Procedure: COLONOSCOPY WITH  PROPOFOL;  Surgeon: Doran Stabler, MD;  Location: [facility];  Service: Gastroenterology;  Laterality: N/A;   ESOPHAGOGASTRODUODENOSCOPY (EGD) WITH PROPOFOL N/A 09/17/2018   Procedure: ESOPHAGOGASTRODUODENOSCOPY (EGD) WITH PROPOFOL;  Surgeon: Doran Stabler, MD;  Location: [facility];  Service: Gastroenterology;  Laterality: N/A;   EUS N/A 02/03/2013   Procedure: LOWER ENDOSCOPIC ULTRASOUND (EUS);  Surgeon: Milus Banister, MD;  Location: [facility];  Service: Endoscopy;  Laterality: N/A;   ILEOSTOMY CLOSURE N/A 08/24/2013   Procedure: CLOSURE OF LOOP ILEOSTOMY ;  Surgeon: Leighton Ruff, MD;  Location: [facility];  Service: General;  Laterality: N/A;   IR ANGIO VERTEBRAL SEL SUBCLAVIAN INNOMINATE BILAT MOD SED  06/08/2018   IR CT HEAD LTD  06/08/2018   IR INTRAVSC STENT CERV CAROTID W/O EMB-PROT MOD SED INC ANGIO  06/08/2018   IR PERCUTANEOUS ART THROMBECTOMY/INFUSION INTRACRANIAL INC DIAG ANGIO  06/08/2018   LAPAROSCOPIC LOW ANTERIOR RESECTION N/A 05/20/2013   Procedure: LAPAROSCOPIC LOW ANTERIOR RESECTION WITH SPLENIC FLEXURE MOBILIZATION;  Surgeon: Leighton Ruff, MD;  Location: [facility];  Service: General;  Laterality: N/A;   OSTOMY N/A 05/20/2013   Procedure: diverting OSTOMY;  Surgeon: Leighton Ruff, MD;  Location: [facility];  Service: General;  Laterality: N/A;   PERIPHERAL VASCULAR INTERVENTION Left 06/14/2018   Procedure: PERIPHERAL VASCULAR INTERVENTION;  Surgeon: Waynetta Sandy, MD;  Location: [facility];  Service: Cardiovascular;  Laterality: Left;  AVF on the Left   RADIOLOGY WITH ANESTHESIA N/A 06/08/2018   Procedure: IR WITH ANESTHESIA;  Surgeon: Radiologist, Medication, MD;  Location: [facility];  Service: Radiology;  Laterality: N/A;   VIDEO ASSISTED THORACOSCOPY (VATS)/WEDGE RESECTION Left 11/20/2015   Procedure: VIDEO ASSISTED THORACOSCOPY (VATS)/LUNG RESECTION;  Surgeon: [name], MD;  Location: [facility];  Service: Thoracic;  Laterality: Left;    VIDEO BRONCHOSCOPY N/A 11/20/2015   Procedure: VIDEO BRONCHOSCOPY;  Surgeon: [name], MD;  Location: [facility];  Service: Thoracic;  Laterality: N/A;        Home Medications    Prior to Admission medications   Medication Sig Start Date End Date Taking? Authorizing Provider  acetaminophen (TYLENOL) 325 MG tablet Take 1-2 tablets (325-650 mg total) by mouth every 4 (four) hours as needed for mild pain. 06/29/18   Love, Ivan Anchors, PA-C  allopurinol (ZYLOPRIM) 100 MG tablet Take 1 tablet by mouth once daily 11/03/18   [name], MD  atorvastatin (LIPITOR) 20 MG tablet TAKE 1 TABLET BY MOUTH  DAILY AT 6 PM. 02/09/19   [name], MD  diphenoxylate-atropine (LOMOTIL) 2.5-0.025 MG tablet Take 2 tablets by mouth 4 (four) times daily as needed for diarrhea or loose stools. 10/05/18   Doran Stabler, MD  Eluxadoline (VIBERZI) 75 MG TABS Take 1 tablet by mouth 3 (three) times daily as needed.    [provider]  ferric citrate (AURYXIA) 1 GM 210 MG(Fe) tablet Take 420 mg by mouth 3 (three) times daily with meals.    [provider]  lipase/protease/amylase (CREON) 36000 UNITS CPEP capsule  Take 1 capsule (36,000 Units total) by mouth 3 (three) times daily with meals. Patient taking differently: Take 36,000 Units by mouth 3 (three) times daily with meals. And 1 with snacks 12/24/18   Doran Stabler, MD  metoprolol succinate (TOPROL-XL) 25 MG 24 hr tablet Take 1 tablet (25 mg total) by mouth daily. 09/20/18   [name], MD  warfarin (COUMADIN) 5 MG tablet TAKE 1 TABLET DAILY OR AS DIRECTED BY ANTICOAGULATION CLINIC 01/23/19   [name], MD    Family History Family History  Problem Relation Age of Onset   Stroke Mother    Hypertension Mother    Stroke Father    Hypertension Father    Hypertension Other    Colon cancer Neg Hx     Social History Social History   Tobacco Use   Smoking status: Former Smoker    Packs/day: 0.50    Years: 10.00      Pack years: 5.00    Types: Cigarettes    Quit date: 07/07/1977    Years since quitting: 41.6   Smokeless tobacco: Never Used  Substance Use Topics   Alcohol use: Yes    Alcohol/week: 0.0 standard drinks    Comment: drinks occasionally   Drug use: No     Allergies   Nsaids, Lactose intolerance (gi), and Amlodipine   Review of Systems Review of Systems  Constitutional: Negative for chills and fever.  HENT: Negative for facial swelling and sore throat.   Respiratory: Negative for shortness of breath.   Cardiovascular: Negative for chest pain.  Gastrointestinal: Positive for abdominal pain and diarrhea. Negative for blood in stool, nausea and vomiting.  Genitourinary: Negative for dysuria.  Musculoskeletal: Negative for back pain.  Skin: Negative for rash and wound.  Neurological: Negative for headaches.  Psychiatric/Behavioral: The patient is not nervous/anxious.      Physical Exam Updated Vital Signs BP 124/68 (BP Location: Right Arm)    Pulse 72    Temp 98.4 F (36.9 C) (Oral)    Resp 16    SpO2 99%   Physical Exam Vitals signs and nursing note reviewed.  Constitutional:      General: He is not in acute distress.    Appearance: He is well-developed. He is not diaphoretic.  HENT:     Head: Normocephalic and atraumatic.     Mouth/Throat:     Pharynx: No oropharyngeal exudate.  Eyes:     General: No scleral icterus.       Right eye: No discharge.        Left eye: No discharge.     Conjunctiva/sclera: Conjunctivae normal.     Pupils: Pupils are equal, round, and reactive to light.  Neck:     Musculoskeletal: Normal range of motion and neck supple.     Thyroid: No thyromegaly.  Cardiovascular:     Rate and Rhythm: Normal rate and regular rhythm.     Heart sounds: Normal heart sounds. No murmur. No friction rub. No gallop.   Pulmonary:     Effort: Pulmonary effort is normal. No respiratory distress.     Breath sounds: Normal breath sounds. No stridor. No  wheezing or rales.  Abdominal:     General: Bowel sounds are normal. There is no distension.     Palpations: Abdomen is soft.     Tenderness: There is no abdominal tenderness. There is no guarding or rebound.  Lymphadenopathy:     Cervical: No cervical adenopathy.  Skin:  General: Skin is warm and dry.     Coloration: Skin is not pale.     Findings: No rash.  Neurological:     Mental Status: He is alert.     Coordination: Coordination normal.      ED Treatments / Results  Labs (all labs ordered are listed, but only abnormal results are displayed) Labs Reviewed  LIPASE, BLOOD - Abnormal; Notable for the following components:      Result Value   Lipase 69 (*)    All other components within normal limits  COMPREHENSIVE METABOLIC PANEL - Abnormal; Notable for the following components:   Chloride 95 (*)    Creatinine, Ser 7.13 (*)    GFR calc non Af Amer 7 (*)    GFR calc Af Amer 8 (*)    All other components within normal limits  CBC - Abnormal; Notable for the following components:   RBC 3.24 (*)    Hemoglobin 10.3 (*)    HCT 31.6 (*)    RDW 15.9 (*)    All other components within normal limits  GASTROINTESTINAL PANEL BY PCR, STOOL (REPLACES STOOL CULTURE)  C DIFFICILE QUICK SCREEN W PCR REFLEX    EKG None  Radiology Ct Abdomen Pelvis Wo Contrast  Result Date: 02/26/2019 CLINICAL DATA:  Mid abdominal pain and diarrhea. History of colon cancer 3 years prior. EXAM: CT ABDOMEN AND PELVIS WITHOUT CONTRAST TECHNIQUE: Multidetector CT imaging of the abdomen and pelvis was performed following the standard protocol without IV contrast. COMPARISON:  CT CAP 12/01/2017 FINDINGS: Lower chest: Normal heart size. Lung bases are clear. No pleural effusion. Stable appearing nodularity along the right hemidiaphragm measuring up to 6 mm (image 73; series 6). Hepatobiliary: Liver is normal in size and contour. Gallbladder is unremarkable. No intrahepatic or extrahepatic biliary ductal  dilatation. Pancreas: Unremarkable Spleen: Unremarkable Adrenals/Urinary Tract: Normal adrenal glands. Redemonstrated bilateral high attenuation renal lesions. Lesion within the superior pole of the right kidney has slightly increased in size measuring 1.5 cm, previously 1.0 cm. Additionally there is a similar-appearing 4.1 cm isodense lesion exophytic inferior pole right kidney. No hydronephrosis. Urinary bladder is unremarkable. Stomach/Bowel: Pancolonic diverticulosis. No CT evidence to suggest acute diverticulitis. No evidence for small bowel obstruction. Postsurgical changes right lower quadrant. Within the gastric antrum/proximal duodenum (image 32; series 3) there is intraluminal gas which appears to be outlining a soft tissue mass. No significant surrounding fat stranding. Vascular/Lymphatic: Normal caliber abdominal aorta. Peripheral calcified atherosclerotic plaque. No retroperitoneal lymphadenopathy. Reproductive: Unremarkable. Other: None. Musculoskeletal: Findings suggestive of AVN involving the left femoral head. Lumbar spine degenerative changes. IMPRESSION: 1. Within the gastric antrum/proximal duodenum there is gas which appears to be outlining a soft tissue mass. This may be gas outlining the pylorus however the possibility of true gastric antral/proximal duodenum mass is not excluded. Recommend further evaluation with upper endoscopy. 2. Bilateral renal lesions which are indeterminate on current noncontrast enhanced examination. Recommend further evaluation with pre and post contrast-enhanced MRI for definitive characterization and to exclude the possibility of enhancing renal mass. Electronically Signed   By: Lovey Newcomer M.D.   On: 02/26/2019 08:56    Procedures Procedures (including critical care time)  Medications Ordered in ED Medications  sodium chloride flush (NS) 0.9 % injection 3 mL (has no administration in time range)     Initial Impression / Assessment and Plan / ED Course    I have reviewed the triage vital signs and the nursing notes.  Pertinent labs & imaging  results that were available during my care of the patient were reviewed by me and considered in my medical decision making (see chart for details).        Patient presenting with evaluation of diarrhea that has been more persistent than baseline.  Patient has had some associated mild to moderate abdominal pain with it.  Patient has been dealing with diarrhea for a while related to his rectal cancer, however he has had a few more episodes than normal per day lately.  Labs are stable for the patient.  CT shows a soft tissue mass between the gastric antrum and proximal duodenum, radiologist recommends upper endoscopy.  CT also shows bilateral renal lesions, recommending MRI for further characterization.  These have been seen on previous studies.  Advised patient to follow-up with GI regarding the possible new duodenal mass and his PCP or oncologist regarding the persistent renal lesions.  Patient is already taking Lomotil and Viberzi.  Patient to follow-up with GI for further management of his diarrhea.  Patient did not have any episodes of diarrhea during his ED course, so GI panel was not collected.  Return precautions discussed.  Patient understands and agrees with plan.  Patient vital stable throughout ED course and discharged in satisfactory condition.  Patient also evaluated by Dr. Ashok Cordia who guided the patient's management and agrees with plan.  Final Clinical Impressions(s) / ED Diagnoses   Final diagnoses:  Diarrhea in adult patient    ED Discharge Orders    None       Frederica Kuster, PA-C 02/26/19 0920    Lajean Saver, MD 02/26/19 [phone]

## 2019-02-28 ENCOUNTER — Encounter: Payer: Self-pay | Admitting: Gastroenterology

## 2019-02-28 ENCOUNTER — Telehealth: Payer: Self-pay

## 2019-02-28 ENCOUNTER — Telehealth: Payer: Self-pay | Admitting: Gastroenterology

## 2019-02-28 ENCOUNTER — Ambulatory Visit (INDEPENDENT_AMBULATORY_CARE_PROVIDER_SITE_OTHER): Payer: Medicare Other | Admitting: Gastroenterology

## 2019-02-28 VITALS — BP 100/60 | HR 76 | Temp 98.4°F | Ht 68.0 in | Wt 146.0 lb

## 2019-02-28 DIAGNOSIS — K529 Noninfective gastroenteritis and colitis, unspecified: Secondary | ICD-10-CM

## 2019-02-28 DIAGNOSIS — Z7901 Long term (current) use of anticoagulants: Secondary | ICD-10-CM

## 2019-02-28 DIAGNOSIS — N186 End stage renal disease: Secondary | ICD-10-CM

## 2019-02-28 DIAGNOSIS — R933 Abnormal findings on diagnostic imaging of other parts of digestive tract: Secondary | ICD-10-CM

## 2019-02-28 DIAGNOSIS — I5020 Unspecified systolic (congestive) heart failure: Secondary | ICD-10-CM | POA: Diagnosis not present

## 2019-02-28 DIAGNOSIS — Z992 Dependence on renal dialysis: Secondary | ICD-10-CM

## 2019-02-28 NOTE — Telephone Encounter (Signed)
Dr Ronnald Ramp,   Please review and reply ASAP. Patient is scheduled for an EGD next Wednesday.  Thank you,  Vivien Rota, [facility] Pre-operative Risk Assessment     Request for surgical clearance:     Endoscopy Procedure  What type of surgery is being performed?    EGD  When is this surgery scheduled?     03-09-2019  Are there any medications that need to be held prior to surgery and how long? Yes, Coumadin   Practice name and name of physician performing surgery?      [facility]/ Dr Loletha Carrow  What is your office phone and fax number?      Phone- [phone]  [phone]  Anesthesia type (None, local, MAC, general) ?       MAC

## 2019-02-28 NOTE — Telephone Encounter (Signed)
Left message to return call 

## 2019-02-28 NOTE — Progress Notes (Signed)
[facility] Progress Note  Chief Complaint: Chronic diarrhea  Subjective  History: Extensive clinical course detailed in my May 6 telemedicine visit note. After that, he finally had symptom relief on a combination of Creon and Viberzi.  It was difficult to tell which of these medicines was making him better, and it was difficulty get a clear and consistent history by our clinical staff from either [place] or his wife which medicine seem to help.  It also seem there were times when he would be out of one or more medicines difficult to correlate the symptoms with this.  He also continued to take budesonide despite communications months ago that was not helping and should be discontinued. He was in the emergency department 2 days ago for worsening of this diarrhea as well as abdominal pain that was diffuse as before and worse after bowel movements.  Noncontrast CT scan abdomen and pelvis suggested thickening and possible mass of the proximal duodenum and either air within the bowel wall or perhaps in the lumen since it was contracted without oral contrast.   He reports having mostly formed stool 1-2 times per day, with intermittent loose stool and urgency with unclear triggers.  He is not very hungry on dialysis days.  His weight has dropped from 155 pounds on 09/03/2018 to 146 pounds today. His wife is not with him today, and it is difficult to tell if Brockel is always taking the medicines prescribed.  He seems to take 2 enzyme capsules with each meal and is still on Viberzi, though there was a recent pause in that we were trying to get him some more samples from the drug company.  Jahmez is no longer on Brilinta, but he is on Coumadin for A. Fib. He was having some ill-defined mid abdominal discomfort the day of ED visit that has subsided today.  ROS: Cardiovascular:  no chest pain Respiratory: no dyspnea Generalized fatigue  The patient's Past Medical, Family and Social History were  reviewed and are on file in the EMR.  Objective:  Med list reviewed  Current Outpatient Medications:  .  acetaminophen (TYLENOL) 325 MG tablet, Take 1-2 tablets (325-650 mg total) by mouth every 4 (four) hours as needed for mild pain., Disp: , Rfl:  .  allopurinol (ZYLOPRIM) 100 MG tablet, Take 1 tablet by mouth once daily, Disp: 90 tablet, Rfl: 1 .  atorvastatin (LIPITOR) 20 MG tablet, TAKE 1 TABLET BY MOUTH  DAILY AT 6 PM., Disp: 90 tablet, Rfl: 1 .  Eluxadoline (VIBERZI) 75 MG TABS, Take 1 tablet by mouth 3 (three) times daily as needed., Disp: , Rfl:  .  ferric citrate (AURYXIA) 1 GM 210 MG(Fe) tablet, Take 420 mg by mouth 3 (three) times daily with meals., Disp: , Rfl:  .  lipase/protease/amylase (CREON) 36000 UNITS CPEP capsule, Take 1 capsule (36,000 Units total) by mouth 3 (three) times daily with meals. (Patient taking differently: Take 36,000 Units by mouth 3 (three) times daily with meals. And 1 with snacks), Disp: 56 capsule, Rfl: 0 .  metoprolol succinate (TOPROL-XL) 25 MG 24 hr tablet, Take 1 tablet (25 mg total) by mouth daily., Disp: 90 tablet, Rfl: 0 .  warfarin (COUMADIN) 5 MG tablet, TAKE 1 TABLET DAILY OR AS DIRECTED BY ANTICOAGULATION CLINIC, Disp: 90 tablet, Rfl: 0 .  diphenoxylate-atropine (LOMOTIL) 2.5-0.025 MG tablet, Take 2 tablets by mouth 4 (four) times daily as needed for diarrhea or loose stools. (Patient not taking: Reported on 02/28/2019), Disp:  240 tablet, Rfl: 1   Vital signs in last 24 hrs: Vitals:   02/28/19 1314  BP: 100/60  Pulse: 76  Temp: 98.4 F (36.9 C)  SpO2: 96%    Physical Exam  He has poor muscle mass and has lost weight since I last saw him  HEENT: sclera anicteric, oral mucosa moist without lesions  Neck: supple, no thyromegaly, JVD or lymphadenopathy  Cardiac: RRR without murmurs, S1S2 heard, no peripheral edema  Pulm: clear to auscultation bilaterally, normal RR and effort noted  Abdomen: soft, no tenderness, with active bowel  sounds. No guarding or palpable hepatosplenomegaly.  No bruit  Skin; warm and dry, no jaundice or rash  Recent Labs:  CMP Latest Ref Rng & Units 02/26/2019 09/17/2018 07/28/2018  Glucose 70 - 99 mg/dL 89 96 94  BUN 8 - 23 mg/dL 23 - 20  Creatinine 0.61 - 1.24 mg/dL 7.13(H) - 5.49(H)  Sodium 135 - 145 mmol/L 140 139 136  Potassium 3.5 - 5.1 mmol/L 3.5 4.4 3.3(L)  Chloride 98 - 111 mmol/L 95(L) - 96(L)  CO2 22 - 32 mmol/L 30 - 29  Calcium 8.9 - 10.3 mg/dL 10.2 - 9.9  Total Protein 6.5 - 8.1 g/dL 7.3 - 7.7  Total Bilirubin 0.3 - 1.2 mg/dL 0.8 - 0.6  Alkaline Phos 38 - 126 U/L 104 - 149(H)  AST 15 - 41 U/L 19 - 23  ALT 0 - 44 U/L 14 - 18   Albumin 3.8  CEA level normal, done as an outpatient at office visit with Dr. Benay Spice on 02/25/2019.  CBC Latest Ref Rng & Units 02/26/2019 02/10/2019 09/17/2018  WBC 4.0 - 10.5 K/uL 6.1 7.6 -  Hemoglobin 13.0 - 17.0 g/dL 10.3(L) 11.2(L) 15.0  Hematocrit 39.0 - 52.0 % 31.6(L) 35.0(L) 44.0  Platelets 150 - 400 K/uL 217 168 -   His INR was 2.6 on July 10, but 8.0 on August 7 (in the emergency department for bleeding from his dialysis fistula).  It was 2.8 on August 10, but 4.2 on August 19.  Review of August 19 anticoagulation clinic note indicates brief hold and then decrease in Coumadin dosing.  Radiologic studies:  CLINICAL DATA:  Mid abdominal pain and diarrhea. History of colon cancer 3 years prior.   EXAM: CT ABDOMEN AND PELVIS WITHOUT CONTRAST   TECHNIQUE: Multidetector CT imaging of the abdomen and pelvis was performed following the standard protocol without IV contrast.   COMPARISON:  CT CAP 12/01/2017   FINDINGS: Lower chest: Normal heart size. Lung bases are clear. No pleural effusion. Stable appearing nodularity along the right hemidiaphragm measuring up to 6 mm (image 73; series 6).   Hepatobiliary: Liver is normal in size and contour. Gallbladder is unremarkable. No intrahepatic or extrahepatic biliary ductal dilatation.    Pancreas: Unremarkable   Spleen: Unremarkable   Adrenals/Urinary Tract: Normal adrenal glands. Redemonstrated bilateral high attenuation renal lesions. Lesion within the superior pole of the right kidney has slightly increased in size measuring 1.5 cm, previously 1.0 cm. Additionally there is a similar-appearing 4.1 cm isodense lesion exophytic inferior pole right kidney. No hydronephrosis. Urinary bladder is unremarkable.   Stomach/Bowel: Pancolonic diverticulosis. No CT evidence to suggest acute diverticulitis. No evidence for small bowel obstruction. Postsurgical changes right lower quadrant. Within the gastric antrum/proximal duodenum (image 32; series 3) there is intraluminal gas which appears to be outlining a soft tissue mass. No significant surrounding fat stranding.   Vascular/Lymphatic: Normal caliber abdominal aorta. Peripheral calcified atherosclerotic plaque. No retroperitoneal  lymphadenopathy.   Reproductive: Unremarkable.   Other: None.   Musculoskeletal: Findings suggestive of AVN involving the left femoral head. Lumbar spine degenerative changes.   IMPRESSION: 1. Within the gastric antrum/proximal duodenum there is gas which appears to be outlining a soft tissue mass. This may be gas outlining the pylorus however the possibility of true gastric antral/proximal duodenum mass is not excluded. Recommend further evaluation with upper endoscopy. 2. Bilateral renal lesions which are indeterminate on current noncontrast enhanced examination. Recommend further evaluation with pre and post contrast-enhanced MRI for definitive characterization and to exclude the possibility of enhancing renal mass.     Electronically Signed   By: Lovey Newcomer M.D.   On: 02/26/2019 08:56  (I have personally reviewed the CT images)   @ASSESSMENTPLANBEGIN @ Assessment: Encounter Diagnoses  Name Primary?  . Chronic diarrhea Yes  . ESRD on dialysis ([place])   . Systolic congestive  heart failure, unspecified HF chronicity ([place])   . Current use of long term anticoagulation   . Abnormal finding on GI tract imaging    This diarrhea has been very puzzling, and while it seems better on the combination of pancreatic enzymes and Viberzi, his ongoing weight loss is worrisome.  This suggests a possible malabsorptive condition.  The CT findings are of unclear significance.  It is a suboptimal study as noted above, but there could be either chronic inflammatory or neoplastic cause going on.  Only a brief look at the duodenum was obtained during his endoscopy 6 months ago because of oxygen desaturation and challenging sedation during his procedure.  He also has colon polyps that were not removed at that time because he was on Brilinta.  That issue will have to wait because I do not want him having both upper endoscopy and colonoscopy at the same time if it can be avoided.  That increases the risk of sedation related troubles.   Plan: Upper endoscopy.  It must be done in the hospital outpatient endoscopy lab, and he needs to be off Coumadin at least 4 days prior.  I am concerned that his INR has been elevated lately, but less so after dose reduction.  We will need to coordinate closely with the anticoagulation clinic and his prescribing physician to determine how long he should and can be off Coumadin prior to an upper endoscopy, and whether or not he should be on bridging Lovenox.  If so, that would significantly complicate things, especially given his dialysis.  This procedure also needs to be done on an off day from dialysis.  We have set a tentative date for September 2, we will communicate with his providers regarding management of anticoagulation.  Procedure was described in detail along with risks and benefits and he is agreeable.  The benefits and risks of the planned procedure were described in detail with the patient or (when appropriate) their health care proxy.  Risks were  outlined as including, but not limited to, bleeding, infection, perforation, adverse medication reaction leading to cardiac or pulmonary decompensation.  The limitation of incomplete mucosal visualization was also discussed.  No guarantees or warranties were given.  Patient at increased risk for cardiopulmonary complications of procedure due to medical comorbidities.  Our staff also spoke with his wife to confirm the scheduling.  Total time 40 minutes, over half spent face-to-face with patient in counseling and coordination of care.   [name]

## 2019-02-28 NOTE — Telephone Encounter (Signed)
Clinic visit today, please, if he can make it with dialysis

## 2019-02-28 NOTE — Telephone Encounter (Signed)
Pt has been scheduled for follow up today at 120pm

## 2019-02-28 NOTE — Patient Instructions (Signed)
If you are age 72 or older, your body mass index should be between 23-30. Your Body mass index is 22.2 kg/m. If this is out of the aforementioned range listed, please consider follow up with your Primary Care Provider.  If you are age 59 or younger, your body mass index should be between 19-25. Your Body mass index is 22.2 kg/m. If this is out of the aformentioned range listed, please consider follow up with your Primary Care Provider.   You have been scheduled for an endoscopy. Please follow written instructions given to you at your visit today. If you use inhalers (even only as needed), please bring them with you on the day of your procedure. Your physician has requested that you go to www.startemmi.com and enter the access code given to you at your visit today. This web site gives a general overview about your procedure. However, you should still follow specific instructions given to you by our office regarding your preparation for the procedure.  Due to recent COVID-19 restrictions implemented by our local and state authorities and in an effort to keep both patients and staff as safe as possible, our hospital system now requires COVID-19 testing prior to any scheduled hospital procedure. Please go to our [facility] location drive thru testing site ([address] [place], [place] [address]) on 03-04-2019 FRIDAY, any time between 9:30am - 3 pm (Wednesday hours are 9:30 am-12 pm and Saturday hours are 9 am-12:30 pm). There will be multiple testing areas, the first checkpoint being for pre-procedure/surgery testing. Get into the right (yellow) lane that leads to the PAT testing team. You will not be billed at the time of testing but may receive a bill later depending on your insurance. The approximate cost of the test is $100. You must agree to quarantine from the time of your testing until the procedure date on 03-09-2019 [facility] . This should include staying at home with ONLY the people you live  with. Avoid take-out, grocery store shopping or leaving the house for any non-emergent reason. Please call our office at [phone] if you have any questions.    It was a pleasure to see you today!   Dr. Loletha Carrow

## 2019-02-28 NOTE — Telephone Encounter (Signed)
I spoke to Jason [name]. He was seen in the ER 02-26-2019. He was told to follow up with [place] due the the CT scan results requesting an EGD. Jason [name] said his diarrhea has increased again to 3-4- loose watery stools with abdominal pain and cramping. We have just supplied him with more samples on Viberzi 75mg  and information for the assistance program to see if they qualify for help. But I do believe there was a few days where he had ran out of the [place]. Not sure if that was a factor into the increase of diarrhea. Today he states he has only had one loose stool with some cramping.

## 2019-03-01 ENCOUNTER — Other Ambulatory Visit: Payer: Self-pay | Admitting: Internal Medicine

## 2019-03-01 DIAGNOSIS — E876 Hypokalemia: Secondary | ICD-10-CM | POA: Diagnosis not present

## 2019-03-01 DIAGNOSIS — D689 Coagulation defect, unspecified: Secondary | ICD-10-CM | POA: Diagnosis not present

## 2019-03-01 DIAGNOSIS — N2581 Secondary hyperparathyroidism of renal origin: Secondary | ICD-10-CM | POA: Diagnosis not present

## 2019-03-01 DIAGNOSIS — N186 End stage renal disease: Secondary | ICD-10-CM | POA: Diagnosis not present

## 2019-03-01 DIAGNOSIS — R197 Diarrhea, unspecified: Secondary | ICD-10-CM | POA: Diagnosis not present

## 2019-03-01 DIAGNOSIS — Z992 Dependence on renal dialysis: Secondary | ICD-10-CM | POA: Diagnosis not present

## 2019-03-01 DIAGNOSIS — D631 Anemia in chronic kidney disease: Secondary | ICD-10-CM | POA: Diagnosis not present

## 2019-03-01 NOTE — Telephone Encounter (Signed)
Cindy-  Can you help with this?  TJ

## 2019-03-02 NOTE — Telephone Encounter (Signed)
Patient scheduled for 03-09-2019. Please forward to [place], CMA thank you.

## 2019-03-03 ENCOUNTER — Other Ambulatory Visit: Payer: Self-pay | Admitting: General Practice

## 2019-03-03 ENCOUNTER — Other Ambulatory Visit: Payer: Self-pay | Admitting: *Deleted

## 2019-03-03 ENCOUNTER — Telehealth: Payer: Self-pay | Admitting: General Practice

## 2019-03-03 ENCOUNTER — Encounter: Payer: Self-pay | Admitting: Internal Medicine

## 2019-03-03 ENCOUNTER — Ambulatory Visit (HOSPITAL_COMMUNITY): Payer: Medicare Other

## 2019-03-03 DIAGNOSIS — Z7901 Long term (current) use of anticoagulants: Secondary | ICD-10-CM

## 2019-03-03 DIAGNOSIS — R197 Diarrhea, unspecified: Secondary | ICD-10-CM | POA: Diagnosis not present

## 2019-03-03 DIAGNOSIS — E876 Hypokalemia: Secondary | ICD-10-CM | POA: Diagnosis not present

## 2019-03-03 DIAGNOSIS — D631 Anemia in chronic kidney disease: Secondary | ICD-10-CM | POA: Diagnosis not present

## 2019-03-03 DIAGNOSIS — Z992 Dependence on renal dialysis: Secondary | ICD-10-CM | POA: Diagnosis not present

## 2019-03-03 DIAGNOSIS — N186 End stage renal disease: Secondary | ICD-10-CM | POA: Diagnosis not present

## 2019-03-03 DIAGNOSIS — N2581 Secondary hyperparathyroidism of renal origin: Secondary | ICD-10-CM | POA: Diagnosis not present

## 2019-03-03 DIAGNOSIS — D689 Coagulation defect, unspecified: Secondary | ICD-10-CM | POA: Diagnosis not present

## 2019-03-03 MED ORDER — ENOXAPARIN SODIUM 60 MG/0.6ML ~~LOC~~ SOLN: 60.0000 mg | SUBCUTANEOUS | 0 refills | Status: DC

## 2019-03-03 NOTE — Telephone Encounter (Signed)
See below

## 2019-03-03 NOTE — Telephone Encounter (Signed)
Response written.  Your fax line is busy so I was not able to fax a signed copy to you.  TJ

## 2019-03-03 NOTE — Telephone Encounter (Signed)
I will contact patient today with instructions for coumadin and Lovenox pre and post procedure scheduled for 9/2.

## 2019-03-03 NOTE — Patient Outreach (Addendum)
Jason [name]) Care Management  03/03/2019  Jason BRENNING Sr. 24-Nov-1945 [id number]   Subjective: Telephone call to patient's home number, spoke with patient's wife / designated party release) Jason [name], she stated patient's name, date of birth, and address.  Discussed Jason [name] Medicare Utilization Management referral follow up, wife voiced understanding, and is in agreement to follow up at a later time.   States she is unable to talk at this time, is getting ready to leave to go pick up patient at dialysis, patient has a COVID test on 03/03/2019 at [name], will also have an endoscopy in the near future, and states she will call RNCM back at a later time.  States she is very appreciative of the follow up.      Objective: Per KPN (Knowledge Performance Now, point of care tool) and chart review, patient has no  Hospitalization within the last 90 days.   Patient had ED visit on 02/26/2019 for diarrhea and 02/10/2019 for Fistula bleeding left arm (arteriovenous dialysis fistula).     Patient also has a history of Anemia, Arthritis, CVA, Diverticulosis, GOUT, Hereditary and idiopathic peripheral neuropathy, radiation therapy, Hypersomnia, Hypertension, Lung nodule, OAB (overactive bladder), Obstructive sleep apnea, rectal cancer, status post partial lobectomy of lung, ESRD with dialysis (every Tuesday, Thursday, Saturday), and atrial fibrillation.      Assessment: Received [employer] Management referralon 03/02/2019.  Referral source: Jason [name].  Referral [name]: Jason [name] Medication assistance, patient may have been working with Education officer, museum in the past, but no update on the status.   Screening  follow up pending patient or patient's wife contact.      Plan: RNCM will send unsuccessful outreach  letter, [name] pamphlet, will call patient or patient's wife for 2nd telephone outreach attempt within 4 business days,  screening follow up, and will proceed with case closure within 10 business days if no return call.     Jason [name], BSN, [place] Management [name] Telephonic CM Phone: [phone] Fax: [phone]

## 2019-03-03 NOTE — Telephone Encounter (Signed)
Dr Ronnald Ramp,   Pt has to hold his coumadin for 5 days prior to his procedure scheduled for 03-09-2019. In order not to have to cancel his scope. We need to have clearance today!!

## 2019-03-03 NOTE — Telephone Encounter (Signed)
Wife calling for instructions on pt's coum. Please call at  [phone]

## 2019-03-03 NOTE — Telephone Encounter (Signed)
Patient has been contacted.  Instructions were given to patient's wife and she verbalized understanding.  She will also pick up written instructions tomorrow at the [place] office.

## 2019-03-03 NOTE — Telephone Encounter (Signed)
-----   Message from [name], MD sent at 03/03/2019  9:18 AM EDT ----- Regarding: FW: Lovenox bridge Ok, that sounds good  TJ ----- Message ----- From: Warden Fillers, RN Sent: 03/03/2019   9:13 AM EDT To: [name], MD Subject: Lovenox bridge                                 In reference to prior message.  Patient's last SrCr was 7.13.  I'm calculating his CrCl at 8.77. If you recommend bridge I would dose at 1 mg/kg Q24.  Jenny Reichmann

## 2019-03-03 NOTE — Telephone Encounter (Signed)
Instructions verbally given to patient's wife and she verbalized understanding.  [name] is coming by [facility] in the morning to pick up written instructions as well.

## 2019-03-03 NOTE — Telephone Encounter (Signed)
Understood.  Thanks for attending to that since his procedure is next week.

## 2019-03-03 NOTE — Telephone Encounter (Signed)
Instructions for coumadin and Lovenox pre and post procedure on 9/2.  Friday 8/28 - Last dose of coumadin until after procedure Saturday -  8/29 - Nothing (No coumadin or Lovenox) Sunday - 8/30 - Lovenox injection in the AM Monday - 8/31 - Lovenox injection in the AM Tuesday - 9/1 - Lovenox injection in the AM (Take by 7 am) Wednesday - 9/2 - Procedure (DO NOT TAKE LOVENOX TODAY) Thursday - 9/3 - Lovenox injection in the AM AND 1 1/2  tablets of coumadin Friday - 9/4 - Lovenox injection in the AM AND 1 1/2  tablets of coumadin Saturday - 9/5 - Lovenox injection in the AM and 1 tablet of coumadin Sunday - 9/6 - Lovenox injection in the AM and 1 tablet of coumadin Monday 9/7 - Stop Lovenox and continue taking 1 tablet daily except 1/2 tablet on Mondays and Thursdays.  Re-check INR on Friday 9/11 at [facility].

## 2019-03-04 ENCOUNTER — Other Ambulatory Visit (HOSPITAL_COMMUNITY): Admission: RE | Admit: 2019-03-04 | Payer: Medicare Other | Source: Ambulatory Visit

## 2019-03-04 ENCOUNTER — Encounter: Payer: Self-pay | Admitting: *Deleted

## 2019-03-04 ENCOUNTER — Other Ambulatory Visit: Payer: Self-pay | Admitting: *Deleted

## 2019-03-04 NOTE — Progress Notes (Signed)
Patients COVID test has been rescheduled for Monday instead of Saturday due to patient having dialysis on Saturday and would not be available due to the hours of the testing site.  Patient is aware that he must wear a mask at the dialysis center after he has been tested for COVID

## 2019-03-04 NOTE — Telephone Encounter (Signed)
Hi Dr. Loletha Carrow,  Please see phone note dated yesterday the 80 th.  This was approved by Dr. Ronnald Ramp but if you have any questions regarding the dosing instructions please don't hesitate to call me.  My # is [phone].    Thanks, Villa Herb, RN

## 2019-03-04 NOTE — Patient Outreach (Signed)
[facility]) Care Management  03/04/2019  SHAMARCUS HOHEISEL Sr. 1946-01-29 [id number]   Subjective: Telephone call to patient's home number, spoke with patient's wife / designated party release) Roylene Reason, she stated patient's name, date of birth, and address.  Discussed [place] Medicare Utilization Management referral follow up, wife voiced understanding, and is in agreement to follow up.   Wife states patient is doing pretty good, remembers speaking with this RNCM in the past, remembers speaking with referral source, and patient will have follow up endoscopy on 03/09/2019 due to recent ED visit.  Per chart review, patient had follow up visit with oncologist on 02/25/2019, and with gastroenterologist on 02/28/2019.  States patient is planning to get flu shot in October of 2020.  Wife states patient is able to manage self care and has assistance as needed. Wife voices understanding of patient's medical diagnosis and treatment plan.  Wife states patient was given samples of Auryxia at dialysis but can not afford to pay for the medication going forward because he is also having to pay for this dialysis, and wife in agreement to patient referral to [place] for [facility] medication assistance.  Wife states she is accessing patient's  [employer] benefits as needed on patient's behalf via member services number on back of card.  Wife states patient does not have any education material, transition of care, care coordination, disease management, disease monitoring, transportation, or community resource needs at this time.  States she is very appreciative of the follow up, is in agreement to receive [place] Management information, and services on patient's behalf.     Objective: Per KPN (Knowledge Performance Now, point of care tool) and chart review, patient has no  Hospitalization within the last 90 days.   Patient had ED visit on  02/26/2019 for diarrhea and 02/10/2019 for Fistula bleeding left arm (arteriovenous dialysis fistula).     Patient also has a history of Anemia, Arthritis, CVA, Diverticulosis, GOUT, Hereditary and idiopathic peripheral neuropathy, radiation therapy, Hypersomnia, Hypertension, Lung nodule, OAB (overactive bladder), Obstructive sleep apnea, rectal cancer, status post partial lobectomy of lung, ESRD with dialysis (every Tuesday, Thursday, Saturday), and atrial fibrillation.      Assessment: Received [employer] Management referralon 03/02/2019.  Referral source: [place].  Referral reason: Auryxia Medication assistance, patient may have been working with Education officer, museum in the past, but no update on the status.   Screening follow up completed and will refer patient to [place] for [facility] medication assistance.      Plan: RNCM will refer patient to [place] for Auryxia medication assistance.     Brennin Durfee H. Annia Friendly, BSN, [place] Management [facility] Telephonic CM Phone: [phone] Fax: [phone]

## 2019-03-04 NOTE — Telephone Encounter (Signed)
Thanks for your help.  You are probably aware that this patient is on dialysis, which affects the dosing of lovenox.  He will have dialysis on Tuesday, Sept 1st, the day before his procedure.   We must be certain that the lovenox is dosed and timed such that it is out of his system on the day of his procedure because I need to take biopsies.

## 2019-03-05 DIAGNOSIS — D631 Anemia in chronic kidney disease: Secondary | ICD-10-CM | POA: Diagnosis not present

## 2019-03-05 DIAGNOSIS — N2581 Secondary hyperparathyroidism of renal origin: Secondary | ICD-10-CM | POA: Diagnosis not present

## 2019-03-05 DIAGNOSIS — R197 Diarrhea, unspecified: Secondary | ICD-10-CM | POA: Diagnosis not present

## 2019-03-05 DIAGNOSIS — E876 Hypokalemia: Secondary | ICD-10-CM | POA: Diagnosis not present

## 2019-03-05 DIAGNOSIS — Z992 Dependence on renal dialysis: Secondary | ICD-10-CM | POA: Diagnosis not present

## 2019-03-05 DIAGNOSIS — N186 End stage renal disease: Secondary | ICD-10-CM | POA: Diagnosis not present

## 2019-03-05 DIAGNOSIS — D689 Coagulation defect, unspecified: Secondary | ICD-10-CM | POA: Diagnosis not present

## 2019-03-06 ENCOUNTER — Other Ambulatory Visit: Payer: Self-pay

## 2019-03-06 ENCOUNTER — Ambulatory Visit (HOSPITAL_COMMUNITY)
Admission: RE | Admit: 2019-03-06 | Discharge: 2019-03-06 | Disposition: A | Discharge status: Home / Self Care | Payer: Medicare Other | Source: Ambulatory Visit | Attending: Interventional Radiology | Admitting: Interventional Radiology

## 2019-03-06 DIAGNOSIS — I639 Cerebral infarction, unspecified: Secondary | ICD-10-CM

## 2019-03-07 ENCOUNTER — Telehealth: Payer: Self-pay | Admitting: Gastroenterology

## 2019-03-07 ENCOUNTER — Other Ambulatory Visit: Payer: Self-pay | Admitting: Pharmacist

## 2019-03-07 ENCOUNTER — Telehealth (HOSPITAL_COMMUNITY): Payer: Self-pay

## 2019-03-07 ENCOUNTER — Other Ambulatory Visit (HOSPITAL_COMMUNITY)
Admission: RE | Admit: 2019-03-07 | Discharge: 2019-03-07 | Disposition: A | Discharge status: Home / Self Care | Payer: Medicare Other | Source: Ambulatory Visit | Attending: Gastroenterology | Admitting: Gastroenterology

## 2019-03-07 ENCOUNTER — Other Ambulatory Visit: Payer: Self-pay | Admitting: *Deleted

## 2019-03-07 ENCOUNTER — Ambulatory Visit (HOSPITAL_COMMUNITY): Payer: Medicare Other

## 2019-03-07 DIAGNOSIS — Z20828 Contact with and (suspected) exposure to other viral communicable diseases: Secondary | ICD-10-CM | POA: Insufficient documentation

## 2019-03-07 DIAGNOSIS — Z01812 Encounter for preprocedural laboratory examination: Secondary | ICD-10-CM | POA: Insufficient documentation

## 2019-03-07 DIAGNOSIS — I129 Hypertensive chronic kidney disease with stage 1 through stage 4 chronic kidney disease, or unspecified chronic kidney disease: Secondary | ICD-10-CM | POA: Diagnosis not present

## 2019-03-07 DIAGNOSIS — Z992 Dependence on renal dialysis: Secondary | ICD-10-CM | POA: Diagnosis not present

## 2019-03-07 DIAGNOSIS — N186 End stage renal disease: Secondary | ICD-10-CM | POA: Diagnosis not present

## 2019-03-07 NOTE — Telephone Encounter (Signed)
Called pt regarding recent mra. He is not having any sx at this time, agreed to f/u in 6 months with mra head w/o. AW

## 2019-03-07 NOTE — Telephone Encounter (Signed)
Spoke to the wife who stated "someone" called them on Friday and rescheduled COVID-19 screening to 8/31. Vivien Rota reported the patient had dialysis on Friday and could not be screening, had dialysis on Saturday but could arrive to the appt by 12:30pm (closing) so the patient was scheduled for screening today. Spoke to wife who reported they were going for screening as soon as we hung up the phone.

## 2019-03-07 NOTE — Patient Outreach (Signed)
[facility]) Care Management  03/07/2019  Jason [name] Sr. 07-02-1946 [id number]   Subjective: Received voicemail message from Roylene Reason, patient's wife/ designated party release, states patient is due to have CT Scan on 03/06/2019, and requested call back.  Telephone call to patient's home number, spoke with patient wife/ designated party release  Roylene Reason), stated patient's name, date of birth, and address.    RNCM advised returning call, wife states she just had a question regarding location of CT Scan, question was answered by hospital, patient received CT Scan on 03/06/2019, appointment went well, and no additional questions at this time.   Wife  states patient does not have any education material, transition of care, care coordination, disease management, disease monitoring, transportation, or community resource needs at this time.  Wife appreciative of the follow up and is in agreement to receive [place] Management services on patient's behalf.     Objective:Per KPN (Knowledge Performance Now, point of care tool) and chart review,patient has no Hospitalization within the last 90 days. Patient had ED visit on 02/26/2019 for diarrhea and 02/10/2019 for Fistula bleeding left arm(arteriovenous dialysis fistula). Patient also has a history of Anemia,Arthritis, CVA,Diverticulosis,GOUT,Hereditary and idiopathic peripheral neuropathy,radiation therapy,Hypersomnia,Hypertension,Lung nodule,OAB (overactive bladder),Obstructive sleep apnea,rectal cancer, status postpartial lobectomy of lung, ESRD with dialysis (every Tuesday, Thursday, Saturday), and atrial fibrillation.     Assessment: Received [facility] Management referralon 03/02/2019. Referral source: [facility]. 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      Plan:RNCM has referred  patient to [place] for [facility] medication assistance.     Reakwon Barren H. Annia Friendly, BSN, [place] [facility] Telephonic CM Phone: [phone] Fax: [phone]

## 2019-03-07 NOTE — Patient Outreach (Signed)
[facility]) Care Management  [place]  03/07/2019  FADIL MACMASTER Sr. 02/23/1946 [id number]   Reason for referral: Medication Assistance with [employer]  Referral source: [facility] Current insurance: [facility]  Outreach:  Unsuccessful telephone call attempt #1 to patient.   HIPAA compliant voicemail left requesting a return call  Plan:  -I will mail patient an unsuccessful outreach letter.  -I will make another outreach attempt to patient within 3-4 business days.    Ralene Bathe, PharmD, [place] [phone]

## 2019-03-08 ENCOUNTER — Ambulatory Visit: Payer: Medicare Other

## 2019-03-08 ENCOUNTER — Ambulatory Visit (INDEPENDENT_AMBULATORY_CARE_PROVIDER_SITE_OTHER): Payer: Medicare Other | Admitting: General Practice

## 2019-03-08 ENCOUNTER — Other Ambulatory Visit: Payer: Self-pay

## 2019-03-08 ENCOUNTER — Encounter (HOSPITAL_COMMUNITY): Payer: Self-pay | Admitting: *Deleted

## 2019-03-08 DIAGNOSIS — R197 Diarrhea, unspecified: Secondary | ICD-10-CM | POA: Diagnosis not present

## 2019-03-08 DIAGNOSIS — I4891 Unspecified atrial fibrillation: Secondary | ICD-10-CM

## 2019-03-08 DIAGNOSIS — Z7901 Long term (current) use of anticoagulants: Secondary | ICD-10-CM | POA: Diagnosis not present

## 2019-03-08 DIAGNOSIS — Z992 Dependence on renal dialysis: Secondary | ICD-10-CM | POA: Diagnosis not present

## 2019-03-08 DIAGNOSIS — D509 Iron deficiency anemia, unspecified: Secondary | ICD-10-CM | POA: Diagnosis not present

## 2019-03-08 DIAGNOSIS — E876 Hypokalemia: Secondary | ICD-10-CM | POA: Diagnosis not present

## 2019-03-08 DIAGNOSIS — D631 Anemia in chronic kidney disease: Secondary | ICD-10-CM | POA: Diagnosis not present

## 2019-03-08 DIAGNOSIS — N186 End stage renal disease: Secondary | ICD-10-CM | POA: Diagnosis not present

## 2019-03-08 DIAGNOSIS — N2581 Secondary hyperparathyroidism of renal origin: Secondary | ICD-10-CM | POA: Diagnosis not present

## 2019-03-08 DIAGNOSIS — Z23 Encounter for immunization: Secondary | ICD-10-CM | POA: Diagnosis not present

## 2019-03-08 DIAGNOSIS — D689 Coagulation defect, unspecified: Secondary | ICD-10-CM | POA: Diagnosis not present

## 2019-03-08 LAB — POCT INR: INR: 1.1 — AB (ref 2.0–3.0)

## 2019-03-08 LAB — SARS CORONAVIRUS 2 (TAT 6-24 HRS): SARS Coronavirus 2: NEGATIVE

## 2019-03-08 NOTE — Patient Instructions (Addendum)
Pre visit review using our clinic review tool, if applicable. No additional management support is needed unless otherwise documented below in the visit note.  Please follow patient instructions given for procedure.  Re-check on 9/11. (See prior coag note)

## 2019-03-09 ENCOUNTER — Ambulatory Visit (HOSPITAL_COMMUNITY): Payer: Medicare Other | Admitting: Physician Assistant

## 2019-03-09 ENCOUNTER — Encounter (HOSPITAL_COMMUNITY)
Admission: RE | Disposition: A | Discharge status: Home / Self Care | Payer: Self-pay | Source: Home / Self Care | Attending: Gastroenterology

## 2019-03-09 ENCOUNTER — Encounter (HOSPITAL_COMMUNITY): Payer: Self-pay | Admitting: Anesthesiology

## 2019-03-09 ENCOUNTER — Ambulatory Visit (HOSPITAL_COMMUNITY)
Admission: RE | Admit: 2019-03-09 | Discharge: 2019-03-09 | Disposition: A | Discharge status: Home / Self Care | Payer: Medicare Other | Attending: Gastroenterology | Admitting: Gastroenterology

## 2019-03-09 DIAGNOSIS — Z992 Dependence on renal dialysis: Secondary | ICD-10-CM | POA: Diagnosis not present

## 2019-03-09 DIAGNOSIS — Z85048 Personal history of other malignant neoplasm of rectum, rectosigmoid junction, and anus: Secondary | ICD-10-CM | POA: Diagnosis not present

## 2019-03-09 DIAGNOSIS — J449 Chronic obstructive pulmonary disease, unspecified: Secondary | ICD-10-CM | POA: Diagnosis not present

## 2019-03-09 DIAGNOSIS — N186 End stage renal disease: Secondary | ICD-10-CM

## 2019-03-09 DIAGNOSIS — I509 Heart failure, unspecified: Secondary | ICD-10-CM | POA: Insufficient documentation

## 2019-03-09 DIAGNOSIS — G4730 Sleep apnea, unspecified: Secondary | ICD-10-CM | POA: Diagnosis not present

## 2019-03-09 DIAGNOSIS — Z888 Allergy status to other drugs, medicaments and biological substances status: Secondary | ICD-10-CM | POA: Insufficient documentation

## 2019-03-09 DIAGNOSIS — I5020 Unspecified systolic (congestive) heart failure: Secondary | ICD-10-CM

## 2019-03-09 DIAGNOSIS — N3281 Overactive bladder: Secondary | ICD-10-CM | POA: Diagnosis not present

## 2019-03-09 DIAGNOSIS — Z902 Acquired absence of lung [part of]: Secondary | ICD-10-CM | POA: Diagnosis not present

## 2019-03-09 DIAGNOSIS — D631 Anemia in chronic kidney disease: Secondary | ICD-10-CM | POA: Insufficient documentation

## 2019-03-09 DIAGNOSIS — G4710 Hypersomnia, unspecified: Secondary | ICD-10-CM | POA: Insufficient documentation

## 2019-03-09 DIAGNOSIS — Z9221 Personal history of antineoplastic chemotherapy: Secondary | ICD-10-CM | POA: Insufficient documentation

## 2019-03-09 DIAGNOSIS — Z6822 Body mass index (BMI) 22.0-22.9, adult: Secondary | ICD-10-CM | POA: Insufficient documentation

## 2019-03-09 DIAGNOSIS — K529 Noninfective gastroenteritis and colitis, unspecified: Secondary | ICD-10-CM

## 2019-03-09 DIAGNOSIS — D649 Anemia, unspecified: Secondary | ICD-10-CM | POA: Diagnosis not present

## 2019-03-09 DIAGNOSIS — I132 Hypertensive heart and chronic kidney disease with heart failure and with stage 5 chronic kidney disease, or end stage renal disease: Secondary | ICD-10-CM | POA: Insufficient documentation

## 2019-03-09 DIAGNOSIS — K634 Enteroptosis: Secondary | ICD-10-CM | POA: Diagnosis not present

## 2019-03-09 DIAGNOSIS — Z8673 Personal history of transient ischemic attack (TIA), and cerebral infarction without residual deficits: Secondary | ICD-10-CM | POA: Diagnosis not present

## 2019-03-09 DIAGNOSIS — K2980 Duodenitis without bleeding: Secondary | ICD-10-CM | POA: Insufficient documentation

## 2019-03-09 DIAGNOSIS — M1990 Unspecified osteoarthritis, unspecified site: Secondary | ICD-10-CM | POA: Diagnosis not present

## 2019-03-09 DIAGNOSIS — M109 Gout, unspecified: Secondary | ICD-10-CM | POA: Insufficient documentation

## 2019-03-09 DIAGNOSIS — Z923 Personal history of irradiation: Secondary | ICD-10-CM | POA: Diagnosis not present

## 2019-03-09 DIAGNOSIS — R197 Diarrhea, unspecified: Secondary | ICD-10-CM | POA: Diagnosis not present

## 2019-03-09 DIAGNOSIS — I4891 Unspecified atrial fibrillation: Secondary | ICD-10-CM | POA: Insufficient documentation

## 2019-03-09 DIAGNOSIS — R933 Abnormal findings on diagnostic imaging of other parts of digestive tract: Secondary | ICD-10-CM

## 2019-03-09 DIAGNOSIS — Z79899 Other long term (current) drug therapy: Secondary | ICD-10-CM | POA: Insufficient documentation

## 2019-03-09 DIAGNOSIS — Z87891 Personal history of nicotine dependence: Secondary | ICD-10-CM | POA: Diagnosis not present

## 2019-03-09 DIAGNOSIS — G609 Hereditary and idiopathic neuropathy, unspecified: Secondary | ICD-10-CM | POA: Diagnosis not present

## 2019-03-09 DIAGNOSIS — R634 Abnormal weight loss: Secondary | ICD-10-CM

## 2019-03-09 DIAGNOSIS — C20 Malignant neoplasm of rectum: Secondary | ICD-10-CM | POA: Diagnosis not present

## 2019-03-09 DIAGNOSIS — E739 Lactose intolerance, unspecified: Secondary | ICD-10-CM | POA: Insufficient documentation

## 2019-03-09 HISTORY — PX: ESOPHAGOGASTRODUODENOSCOPY (EGD) WITH PROPOFOL: SHX5813

## 2019-03-09 HISTORY — PX: BIOPSY: SHX5522

## 2019-03-09 LAB — POCT I-STAT 4, (NA,K, GLUC, HGB,HCT)
Glucose, Bld: 79 mg/dL (ref 70–99)
HCT: 37 % — ABNORMAL LOW (ref 39.0–52.0)
Hemoglobin: 12.6 g/dL — ABNORMAL LOW (ref 13.0–17.0)
Potassium: 4.4 mmol/L (ref 3.5–5.1)
Sodium: 134 mmol/L — ABNORMAL LOW (ref 135–145)

## 2019-03-09 SURGERY — ESOPHAGOGASTRODUODENOSCOPY (EGD) WITH PROPOFOL
Anesthesia: Monitor Anesthesia Care

## 2019-03-09 MED ORDER — PROPOFOL 10 MG/ML IV BOLUS
INTRAVENOUS | Status: AC
  Filled 2019-03-09: qty 20

## 2019-03-09 MED ORDER — EPHEDRINE SULFATE 50 MG/ML IJ SOLN
INTRAMUSCULAR | Status: DC | PRN
  Administered 2019-03-09 (×2): 5 mg via INTRAVENOUS

## 2019-03-09 MED ORDER — SODIUM CHLORIDE 0.9 % IV SOLN
INTRAVENOUS | Status: DC
  Administered 2019-03-09: 1000 mL via INTRAVENOUS

## 2019-03-09 MED ORDER — PHENYLEPHRINE HCL (PRESSORS) 10 MG/ML IV SOLN
INTRAVENOUS | Status: DC | PRN
  Administered 2019-03-09: 80 ug via INTRAVENOUS
  Administered 2019-03-09 (×2): 40 ug via INTRAVENOUS

## 2019-03-09 MED ORDER — PROPOFOL 10 MG/ML IV BOLUS
INTRAVENOUS | Status: DC | PRN
  Administered 2019-03-09 (×3): 10 mg via INTRAVENOUS

## 2019-03-09 MED ORDER — PROPOFOL 10 MG/ML IV BOLUS
INTRAVENOUS | Status: AC
  Filled 2019-03-09: qty 40

## 2019-03-09 MED ORDER — PROPOFOL 500 MG/50ML IV EMUL
INTRAVENOUS | Status: DC | PRN
  Administered 2019-03-09: 100 ug/kg/min via INTRAVENOUS

## 2019-03-09 MED ORDER — PROMETHAZINE HCL 25 MG/ML IJ SOLN: 6.2500 mg | INTRAMUSCULAR | Status: DC | PRN

## 2019-03-09 MED ORDER — LIDOCAINE HCL 1 % IJ SOLN
INTRAMUSCULAR | Status: DC | PRN
  Administered 2019-03-09: 40 mg via INTRADERMAL
  Administered 2019-03-09: 60 mg via INTRADERMAL

## 2019-03-09 SURGICAL SUPPLY — 15 items

## 2019-03-09 NOTE — Transfer of Care (Signed)
Immediate Anesthesia Transfer of Care Note  Patient: Jason [name].  Procedure(s) Performed: ESOPHAGOGASTRODUODENOSCOPY (EGD) WITH PROPOFOL (N/A ) BIOPSY  Patient Location: PACU and Endoscopy Unit  Anesthesia Type:MAC  Level of Consciousness: awake, alert , oriented and patient cooperative  Airway & Oxygen Therapy: Patient Spontanous Breathing and Patient connected to nasal cannula oxygen  Post-op Assessment: Report given to RN and Post -op Vital signs reviewed and stable  Post vital signs: Reviewed and stable  Last Vitals:  Vitals Value Taken Time  BP 107/33 03/09/19 1358  Temp 36.6 C 03/09/19 1352  Pulse 69 03/09/19 1400  Resp 24 03/09/19 1400  SpO2 100 % 03/09/19 1400  Vitals shown include unvalidated device data.  Last Pain:  Vitals:   03/09/19 1352  TempSrc: Temporal  PainSc: 0-No pain         Complications: No apparent anesthesia complications

## 2019-03-09 NOTE — Anesthesia Postprocedure Evaluation (Signed)
Anesthesia Post Note  Patient: HOLDEN MANISCALCO Sr.  Procedure(s) Performed: ESOPHAGOGASTRODUODENOSCOPY (EGD) WITH PROPOFOL (N/A ) BIOPSY     Patient location during evaluation: Endoscopy Anesthesia Type: MAC Level of consciousness: awake and alert Pain management: pain level controlled Vital Signs Assessment: post-procedure vital signs reviewed and stable Respiratory status: spontaneous breathing, nonlabored ventilation and respiratory function stable Cardiovascular status: stable and blood pressure returned to baseline Postop Assessment: no apparent nausea or vomiting Anesthetic complications: no    Last Vitals:  Vitals:   03/09/19 1227 03/09/19 1352  BP: 106/60 (!) 91/34  Pulse: 73   Resp: 17 17  Temp: 36.7 C 36.6 C  SpO2: 100% 100%    Last Pain:  Vitals:   03/09/19 1410  TempSrc:   PainSc: 0-No pain                 Lynda Rainwater

## 2019-03-09 NOTE — Interval H&P Note (Signed)
History and Physical Interval Note:  03/09/2019 1:22 PM  Jason Cotta Sr.  has presented today for surgery, with the diagnosis of ABNORMAL CT SCAN.  The various methods of treatment have been discussed with the patient and family. After consideration of risks, benefits and other options for treatment, the patient has consented to  Procedure(s): ESOPHAGOGASTRODUODENOSCOPY (EGD) WITH PROPOFOL (N/A) as a surgical intervention.  The patient's history has been reviewed, patient examined, no change in status, stable for surgery.  I have reviewed the patient's chart and labs.  Questions were answered to the patient's satisfaction.     [name]

## 2019-03-09 NOTE — Anesthesia Preprocedure Evaluation (Addendum)
Anesthesia Evaluation  Patient identified by MRN, date of birth, ID band Patient awake    Reviewed: Allergy & Precautions, NPO status , Patient's Chart, lab work & pertinent test results, reviewed documented beta blocker date and time   History of Anesthesia Complications Negative for: history of anesthetic complications  Airway Mallampati: II  TM Distance: >3 FB Neck ROM: Full    Dental no notable dental hx.    Pulmonary neg pulmonary ROS, sleep apnea and Continuous Positive Airway Pressure Ventilation , COPD, former smoker,    Pulmonary exam normal breath sounds clear to auscultation       Cardiovascular hypertension, Pt. on home beta blockers and Pt. on medications +CHF  negative cardio ROS Normal cardiovascular exam+ dysrhythmias (on Coumadin) Atrial Fibrillation  Rhythm:Regular Rate:Normal  TTE 2019: EF 20-25%, diffuse hypokinesis, mild LAE, mild RAE    Neuro/Psych CVA (2019) negative neurological ROS  negative psych ROS   GI/Hepatic negative GI ROS, Neg liver ROS, Rectal ca   Endo/Other  negative endocrine ROS  Renal/GU ESRF and DialysisRenal disease (HD T/Th/Sa)negative Renal ROS  negative genitourinary   Musculoskeletal negative musculoskeletal ROS (+) Arthritis ,   Abdominal   Peds negative pediatric ROS (+)  Hematology negative hematology ROS (+) anemia , Hgb 10.3   Anesthesia Other Findings Day of surgery medications reviewed with the patient.  Reproductive/Obstetrics negative OB ROS                            Anesthesia Physical Anesthesia Plan  ASA: III  Anesthesia Plan: MAC   Post-op Pain Management:    Induction: Intravenous  PONV Risk Score and Plan: 2 and Treatment may vary due to age or medical condition, Propofol infusion and Ondansetron  Airway Management Planned: Nasal Cannula  Additional Equipment:   Intra-op Plan:   Post-operative Plan:   Informed  Consent: I have reviewed the patients History and Physical, chart, labs and discussed the procedure including the risks, benefits and alternatives for the proposed anesthesia with the patient or authorized representative who has indicated his/her understanding and acceptance.     Dental advisory given  Plan Discussed with: CRNA  Anesthesia Plan Comments:        Anesthesia Quick Evaluation

## 2019-03-09 NOTE — H&P (Signed)
History:  This patient presents for endoscopic testing for chronic diarrhea and abnormal imaging of duodenum on recent CT scan (see recent office note).  Jason K Revelo Sr. Referring physician: [name], MD  Past Medical History: Past Medical History:  Diagnosis Date  . Anemia   . Arthritis   . Chronic renal insufficiency 07/31/2011  . COPD (chronic obstructive pulmonary disease) ([place])    pt denies  . CVA (cerebral vascular accident) ([place]) 06/2018  . Deficiency anemia 07/20/2012  . Diverticulosis   . Edema 09/21/2010   Qualifier: Diagnosis of  By: Jerold Coombe    . ERECTILE DYSFUNCTION, MILD 05/11/2007   Qualifier: Diagnosis of  By: Sherren Mocha MD, Jory Ee   . Exertional dyspnea 10/30/2015  . GOUT 05/11/2007   Qualifier: Diagnosis of  By: Sherren Mocha MD, Jory Ee   . Hereditary and idiopathic peripheral neuropathy 09/14/2014  . History of radiation therapy 02/21/13-03/31/13   rectum 50.4Gy total dose  . Hypersomnia 10/30/2015  . Hypertension   . Lung nodule 10/30/2015  . OAB (overactive bladder) 01/09/2016  . Obesity (BMI 30.0-34.9) 10/30/2015  . Obstructive sleep apnea 07/29/2010   HST 11/2015 AHI 54.  On autocpap  >> 10 cm   . Peripheral edema 09/19/2013  . rectal ca dx'd 02/01/13   rectal. Radiation and chemotherapy- remains on chemotherapy-next tx. 08-22-13.   . S/P partial lobectomy of lung 11/20/2015  . S/P thoracotomy   . SUI (stress urinary incontinence), male 01/09/2016     Past Surgical History: Past Surgical History:  Procedure Laterality Date  . A/V FISTULAGRAM Left 06/14/2018   Procedure: A/V FISTULAGRAM;  Surgeon: Waynetta Sandy, MD;  Location: [place] [facility];  Service: Cardiovascular;  Laterality: Left;  . AV FISTULA PLACEMENT Left 03/24/2016   Procedure: CREATION OF LEFT BRACIOCEPHALIC ARTERIOVENOUS (AV) FISTULA;  Surgeon: Elam Dutch, MD;  Location: [place];  Service: Vascular;  Laterality: Left;  . BIOPSY  09/17/2018   Procedure: BIOPSY;  Surgeon: Doran Stabler, MD;  Location: [facility];  Service: Gastroenterology;;  . COLON SURGERY  05/20/2013  . COLONOSCOPY  2010   [facility]  . COLONOSCOPY WITH PROPOFOL N/A 09/17/2018   Procedure: COLONOSCOPY WITH PROPOFOL;  Surgeon: Doran Stabler, MD;  Location: [facility];  Service: Gastroenterology;  Laterality: N/A;  . ESOPHAGOGASTRODUODENOSCOPY (EGD) WITH PROPOFOL N/A 09/17/2018   Procedure: ESOPHAGOGASTRODUODENOSCOPY (EGD) WITH PROPOFOL;  Surgeon: Doran Stabler, MD;  Location: [facility];  Service: Gastroenterology;  Laterality: N/A;  . EUS N/A 02/03/2013   Procedure: LOWER ENDOSCOPIC ULTRASOUND (EUS);  Surgeon: Milus Banister, MD;  Location: [facility];  Service: Endoscopy;  Laterality: N/A;  . ILEOSTOMY CLOSURE N/A 08/24/2013   Procedure: CLOSURE OF LOOP ILEOSTOMY ;  Surgeon: Leighton Ruff, MD;  Location: [facility];  Service: General;  Laterality: N/A;  . IR ANGIO VERTEBRAL SEL SUBCLAVIAN INNOMINATE BILAT MOD SED  06/08/2018  . IR CT HEAD LTD  06/08/2018  . IR INTRAVSC STENT CERV CAROTID W/O EMB-PROT MOD SED INC ANGIO  06/08/2018  . IR PERCUTANEOUS ART THROMBECTOMY/INFUSION INTRACRANIAL INC DIAG ANGIO  06/08/2018  . LAPAROSCOPIC LOW ANTERIOR RESECTION N/A 05/20/2013   Procedure: LAPAROSCOPIC LOW ANTERIOR RESECTION WITH SPLENIC FLEXURE MOBILIZATION;  Surgeon: Leighton Ruff, MD;  Location: [facility];  Service: General;  Laterality: N/A;  . OSTOMY N/A 05/20/2013   Procedure: diverting OSTOMY;  Surgeon: Leighton Ruff, MD;  Location: [facility];  Service: General;  Laterality: N/A;  . PERIPHERAL VASCULAR INTERVENTION  Left 06/14/2018   Procedure: PERIPHERAL VASCULAR INTERVENTION;  Surgeon: Waynetta Sandy, MD;  Location: [place] [facility];  Service: Cardiovascular;  Laterality: Left;  AVF on the Left  . RADIOLOGY WITH ANESTHESIA N/A 06/08/2018   Procedure: IR WITH ANESTHESIA;  Surgeon: Radiologist, Medication, MD;  Location: [place] [facility];  Service: Radiology;  Laterality: N/A;  . VIDEO ASSISTED  THORACOSCOPY (VATS)/WEDGE RESECTION Left 11/20/2015   Procedure: VIDEO ASSISTED THORACOSCOPY (VATS)/LUNG RESECTION;  Surgeon: [name], MD;  Location: [facility];  Service: Thoracic;  Laterality: Left;  Marland Kitchen VIDEO BRONCHOSCOPY N/A 11/20/2015   Procedure: VIDEO BRONCHOSCOPY;  Surgeon: [name], MD;  [facility]: [facility];  Service: Thoracic;  Laterality: N/A;    Allergies: Allergies  Allergen Reactions  . Nsaids Other (See Comments)    Asked by surgeon to add this medication class as intolerance due to patients renal insufficiency.  . Lactose Intolerance (Gi) Diarrhea  . Amlodipine Other (See Comments)    Edema     Outpatient Meds: Current Facility-Administered Medications  Medication Dose Route Frequency Provider Last Rate Last Dose  . 0.9 %  sodium chloride infusion   Intravenous Continuous [name], MD 20 mL/hr at 03/09/19 1248 1,000 mL at 03/09/19 1248  . promethazine (PHENERGAN) injection 6.25-12.5 mg  6.25-12.5 mg Intravenous Q15 min PRN Brennan Bailey, MD          ___________________________________________________________________ Objective   Exam:  BP 106/60   Pulse 73   Temp 98 F (36.7 C) (Oral)   Resp 17   Ht 5\' 8"  (1.727 m)   Wt 66.2 kg   SpO2 100%   BMI 22.19 kg/m    CV: RRR without murmur, S1/S2, no JVD, no peripheral edema  Resp: clear to auscultation bilaterally, normal RR and effort noted  GI: soft, no tenderness, with active bowel sounds. No guarding or palpable organomegaly noted.  Neuro: awake, alert and oriented x 3. Normal gross motor function and fluent speech   Assessment:  See above  Plan:  EGD with biopsies.  Patient has been off coumadin 5 days and last dose of lovenox was yesterday. Had hemodialysis yesterday.   [name]

## 2019-03-09 NOTE — Op Note (Signed)
[facility] Patient Name: Jason [name] Procedure Date: 03/09/2019 MRN: [id number] Attending MD: Estill Cotta. Loletha Carrow , MD Date of Birth: [birth year] CSN: [id number] Age: 73 Admit Type: Outpatient Procedure:                Upper GI endoscopy Indications:              Abnormal CT of the GI tract (during recent ED visit                            - suggested duodenal wall thickening), Diarrhea,                            Weight loss. Patient has been on pancreatic enzymes                            and Viberzi. Providers:                Estill Cotta. Loletha Carrow, MD, Cleda Daub, RN, Elspeth Cho Tech., Technician, Karis Juba, CRNA Referring MD:              Medicines:                Monitored Anesthesia Care Complications:            No immediate complications. Estimated Blood Loss:     Estimated blood loss was minimal. Procedure:                Pre-Anesthesia Assessment:                           - Prior to the procedure, a History and Physical                            was performed, and patient medications and                            allergies were reviewed. The patient's tolerance of                            previous anesthesia was also reviewed. The risks                            and benefits of the procedure and the sedation                            options and risks were discussed with the patient.                            All questions were answered, and informed consent                            was obtained. Prior Anticoagulants: The patient  last took Coumadin (warfarin) 5 days and Lovenox                            (enoxaparin) 1 day prior to the procedure. ASA                            Grade Assessment: III - A patient with severe                            systemic disease. After reviewing the risks and                            benefits, the patient was deemed in satisfactory      condition to undergo the procedure.                           After obtaining informed consent, the endoscope was                            passed under direct vision. Throughout the                            procedure, the patient's blood pressure, pulse, and                            oxygen saturations were monitored continuously. The                            GIF-H190 ([id number]) Olympus gastroscope was                            introduced through the mouth, and advanced to the                            third part of duodenum. The upper GI endoscopy was                            accomplished without difficulty. The patient                            tolerated the procedure. Scope In: Scope Out: Findings:      The esophagus was normal.      The stomach was normal.      The cardia and gastric fundus were normal on retroflexion.      The examined duodenum was normal. Specifically, the mucosa appeared       normal, and no mass was seen. Four biopsies were taken from the second       and third portions of the duodenum with a cold forceps for histology. Impression:               - Normal esophagus.                           - Normal stomach.                           -  Normal examined duodenum. Biopsied. Moderate Sedation:      Not Applicable - Patient had care per Anesthesia. Recommendation:           - Patient has a contact number available for                            emergencies. The signs and symptoms of potential                            delayed complications were discussed with the                            patient. Return to normal activities tomorrow.                            Written discharge instructions were provided to the                            patient.                           - Resume previous diet.                           - Continue present medications.                           - Resume Coumadin (warfarin) today and Lovenox                             (enoxaparin) today at prior doses. Refer to                            managing physician for further adjustment of                            therapy.                           - Await pathology results. Procedure Code(s):        --- Professional ---                           [phone], Esophagogastroduodenoscopy, flexible,                            transoral; with biopsy, single or multiple Diagnosis Code(s):        --- Professional ---                           R19.7, Diarrhea, unspecified                           R63.4, Abnormal weight loss                           R93.3, Abnormal findings on diagnostic imaging of  other parts of digestive tract CPT copyright 2019 American Medical Association. All rights reserved. The codes documented in this report are preliminary and upon coder review may  be revised to meet current compliance requirements. Henry L. Loletha Carrow, MD 03/09/2019 1:49:51 PM This report has been signed electronically. Number of Addenda: 0

## 2019-03-09 NOTE — Discharge Instructions (Signed)
YOU HAD AN ENDOSCOPIC PROCEDURE TODAY: Refer to the procedure report and other information in the discharge instructions given to you for any specific questions about what was found during the examination. If this information does not answer your questions, please call [place] office at [phone] to clarify.  ° °YOU SHOULD EXPECT: Some feelings of bloating in the abdomen. Passage of more gas than usual. Walking can help get rid of the air that was put into your GI tract during the procedure and reduce the bloating. If you had a lower endoscopy (such as a colonoscopy or flexible sigmoidoscopy) you may notice spotting of blood in your stool or on the toilet paper. Some abdominal soreness may be present for a day or two, also. ° °DIET: Your first meal following the procedure should be a light meal and then it is ok to progress to your normal diet. A half-sandwich or bowl of soup is an example of a good first meal. Heavy or fried foods are harder to digest and may make you feel nauseous or bloated. Drink plenty of fluids but you should avoid alcoholic beverages for 24 hours. If you had a esophageal dilation, please see attached instructions for diet.   ° °ACTIVITY: Your care partner should take you home directly after the procedure. You should plan to take it easy, moving slowly for the rest of the day. You can resume normal activity the day after the procedure however YOU SHOULD NOT DRIVE, use power tools, machinery or perform tasks that involve climbing or major physical exertion for 24 hours (because of the sedation medicines used during the test).  ° °SYMPTOMS TO REPORT IMMEDIATELY: °A gastroenterologist can be reached at any hour. Please call [phone]  for any of the following symptoms:  °Following lower endoscopy (colonoscopy, flexible sigmoidoscopy) °Excessive amounts of blood in the stool  °Significant tenderness, worsening of abdominal pains  °Swelling of the abdomen that is new, acute  °Fever of 100° or  higher  °Following upper endoscopy (EGD, EUS, ERCP, esophageal dilation) °Vomiting of blood or coffee ground material  °New, significant abdominal pain  °New, significant chest pain or pain under the shoulder blades  °Painful or persistently difficult swallowing  °New shortness of breath  °Black, tarry-looking or red, bloody stools ° °FOLLOW UP:  °If any biopsies were taken you will be contacted by phone or by letter within the next 1-3 weeks. Call [phone]  if you have not heard about the biopsies in 3 weeks.  °Please also call with any specific questions about appointments or follow up tests. ° °

## 2019-03-10 ENCOUNTER — Encounter (HOSPITAL_COMMUNITY): Payer: Self-pay | Admitting: Gastroenterology

## 2019-03-10 DIAGNOSIS — D509 Iron deficiency anemia, unspecified: Secondary | ICD-10-CM | POA: Diagnosis not present

## 2019-03-10 DIAGNOSIS — E876 Hypokalemia: Secondary | ICD-10-CM | POA: Diagnosis not present

## 2019-03-10 DIAGNOSIS — R197 Diarrhea, unspecified: Secondary | ICD-10-CM | POA: Diagnosis not present

## 2019-03-10 DIAGNOSIS — N186 End stage renal disease: Secondary | ICD-10-CM | POA: Diagnosis not present

## 2019-03-10 DIAGNOSIS — Z992 Dependence on renal dialysis: Secondary | ICD-10-CM | POA: Diagnosis not present

## 2019-03-10 DIAGNOSIS — Z23 Encounter for immunization: Secondary | ICD-10-CM | POA: Diagnosis not present

## 2019-03-10 DIAGNOSIS — N2581 Secondary hyperparathyroidism of renal origin: Secondary | ICD-10-CM | POA: Diagnosis not present

## 2019-03-10 DIAGNOSIS — D689 Coagulation defect, unspecified: Secondary | ICD-10-CM | POA: Diagnosis not present

## 2019-03-10 DIAGNOSIS — D631 Anemia in chronic kidney disease: Secondary | ICD-10-CM | POA: Diagnosis not present

## 2019-03-10 NOTE — Progress Notes (Signed)
Attempted, unable to leave message

## 2019-03-11 ENCOUNTER — Other Ambulatory Visit: Payer: Self-pay

## 2019-03-11 ENCOUNTER — Other Ambulatory Visit: Payer: Self-pay | Admitting: Pharmacist

## 2019-03-11 ENCOUNTER — Ambulatory Visit: Payer: Self-pay | Admitting: Pharmacist

## 2019-03-11 MED ORDER — VIBERZI 75 MG PO TABS: 75.0000 mg | ORAL_TABLET | Freq: Two times a day (BID) | ORAL | 3 refills | Status: DC

## 2019-03-11 NOTE — Patient Outreach (Signed)
[facility]) Care Management  [place]   03/11/2019  SEVILLE DOWNS Sr. 08-10-1945 [id number]  Reason for referral: Medication Assistance with Auryxia  Referral source: [facility] Current insurance: [facility]  Outreach:  Successful telephone call with Mr. Shammas Sr.  HIPAA identifiers verified.   Subjective:  Patient gives permission for me to speak with his spouse, [name]. She reports he was recently approved for Auryxia through his dialysis center and therefore does not need patient assistance with it. Spouse also reports patient approved for [employer] patient assistance program and was assisted by GI provider office.  He has been using samples of Viberzi this past month but is almost out.  Patient and spouse unsure of what the co-pay will be as they have not had a prescription sent in to pharmacy yet.    Objective: The ASCVD Risk score Mikey Bussing DC Jr., et al., 2013) failed to calculate for the following reasons:   The patient has a prior MI or stroke diagnosis  Lab Results  Component Value Date   CREATININE 7.13 (H) 02/26/2019   CREATININE 5.49 (H) 07/28/2018   CREATININE 4.27 (H) 06/28/2018    Lab Results  Component Value Date   HGBA1C 5.5 06/09/2018    Lipid Panel     Component Value Date/Time   CHOL 151 08/17/2018 0816   TRIG 114 08/17/2018 0816   TRIG 104 05/11/2006 1014   HDL 66 08/17/2018 0816   CHOLHDL 2.3 08/17/2018 0816   CHOLHDL 3.6 06/09/2018 0454   VLDL 20 06/09/2018 0454   LDLCALC 62 08/17/2018 0816   LDLDIRECT 120.0 06/04/2015 1015    BP Readings from Last 3 Encounters:  03/09/19 (!) 123/59  02/28/19 100/60  02/26/19 124/68    Allergies  Allergen Reactions  . Nsaids Other (See Comments)    Asked by surgeon to add this medication class as intolerance due to patients renal insufficiency.  . Lactose Intolerance (Gi) Diarrhea  . Amlodipine Other (See Comments)    Edema     Medications Reviewed Today    Reviewed by Cleda Daub, RN (Registered Nurse) on 03/09/19 at 1312  Med List Status: Complete  Medication Order Taking? Sig Documenting Provider Last Dose Status Informant  allopurinol (ZYLOPRIM) 100 MG tablet [id number] Yes Take 1 tablet by mouth once daily  Patient taking differently: Take 100 mg by mouth daily.    [name], MD 03/08/2019 Unknown time Active Spouse/Significant Other  atorvastatin (LIPITOR) 20 MG tablet [id number] Yes TAKE 1 TABLET BY MOUTH  DAILY AT 6 PM.  Patient taking differently: Take 20 mg by mouth daily at 6 PM.    [name], MD 03/08/2019 Unknown time Active Spouse/Significant Other  Eluxadoline (VIBERZI) 75 MG TABS [id number] Yes Take 75 mg by mouth 3 (three) times daily as needed (ibs).  [provider] 03/08/2019 Unknown time Active Spouse/Significant Other  enoxaparin (LOVENOX) 60 MG/0.6ML injection [id number] Yes Inject 0.6 mLs (60 mg total) into the skin daily for 7 doses. [name], MD 03/08/2019 Unknown time Active Spouse/Significant Other           Med Note Jilda Roche A   Fri Mar 04, 2019  1:33 PM) Start 8/30  ferric citrate (AURYXIA) 1 GM 210 MG(Fe) tablet [id number] Yes Take 210 mg by mouth See admin instructions. Take 210 mg with breakfast, 210 mg with dinner, and 210 mg with a snack at bedtime [provider] 03/08/2019 Unknown time Active Spouse/Significant Other  Homeopathic Products ([place]) [id number] Yes Apply 1 application topically daily as needed (pain). [provider] 03/08/2019 Unknown time Active Spouse/Significant Other  lipase/protease/amylase (CREON) 36000 UNITS CPEP capsule [id number] Yes Take 1 capsule (36,000 Units total) by mouth 3 (three) times daily with meals.  Patient taking differently: Take 36,000-72,000 Units by mouth See admin instructions. Take 72000 units with breakfast and 36000 units with dinner   Doran Stabler, MD 03/08/2019 Unknown time Active Spouse/Significant Other  Menthol  (ICY HOT) 5 % PTCH [id number] Yes Apply 1 patch topically daily as needed (pain). [provider] Past Week Unknown time Active Spouse/Significant Other  metoprolol succinate (TOPROL-XL) 25 MG 24 hr tablet [id number] Yes Take 1 tablet (25 mg total) by mouth daily. [name], MD 03/09/2019 0900 Active Spouse/Significant Other  warfarin (COUMADIN) 5 MG tablet [id number] No TAKE 1 TABLET DAILY OR AS DIRECTED BY ANTICOAGULATION CLINIC  Patient taking differently: Take 2.5-5 mg by mouth See admin instructions. Take 2.5 mg in the morning on Mon and Thurs.  Take 5 mg in the morning on Sun, Tue, Wed, Fri, and Sat.   [name], MD 03/02/2019 Active Spouse/Significant Other           Med Note Caryn Section, [place] A   Fri Mar 04, 2019  1:33 PM) On hold for procedure          Assessment: Drugs sorted by system:  Hematologic: enoxaparin, warfarin  Cardiovascular: atorvastatin, metoprolol  Gastrointestinal: Eluxadoline, lipase/protease/amylase enzymes  Renal: auryxia  Topical: menthol patch  Miscellaneous: allopurinol  Medication Review Findings:  . Patient still on warfarin / enoxaparin bridge.  Reviewed medications with patient and spouse.  Patient has f/u INR check on 9/11.    Medication Assistance Findings:  Medication assistance needs identified: Viberzi  Extra Help:  Not eligible for Extra Help Low Income Subsidy based on reported income and assets  Patient Assistance Programs: Viberzi made by [place] requirement met: Yes o Out-of-pocket prescription expenditure met:   Not Applicable - Reviewed program requirements with patient.   - Patient MAY be eligible depending on co-pay amount.  [employer] program is very strict and usually does not accept patients if insurance covers medication even if only a small percent.  - Spouse will contact GI office to ask about more samples and request RX be sent to pharmacy to find out cost  Plan: . Will route note to [place] at GI  office Vivien Rota) who spouse has worked with in the past to also ask if office can assist with Viberzi application since they may already have income documents on file from [place], PharmD, [place] [phone]

## 2019-03-12 DIAGNOSIS — Z23 Encounter for immunization: Secondary | ICD-10-CM | POA: Diagnosis not present

## 2019-03-12 DIAGNOSIS — N186 End stage renal disease: Secondary | ICD-10-CM | POA: Diagnosis not present

## 2019-03-12 DIAGNOSIS — D631 Anemia in chronic kidney disease: Secondary | ICD-10-CM | POA: Diagnosis not present

## 2019-03-12 DIAGNOSIS — R197 Diarrhea, unspecified: Secondary | ICD-10-CM | POA: Diagnosis not present

## 2019-03-12 DIAGNOSIS — Z992 Dependence on renal dialysis: Secondary | ICD-10-CM | POA: Diagnosis not present

## 2019-03-12 DIAGNOSIS — D689 Coagulation defect, unspecified: Secondary | ICD-10-CM | POA: Diagnosis not present

## 2019-03-12 DIAGNOSIS — N2581 Secondary hyperparathyroidism of renal origin: Secondary | ICD-10-CM | POA: Diagnosis not present

## 2019-03-12 DIAGNOSIS — E876 Hypokalemia: Secondary | ICD-10-CM | POA: Diagnosis not present

## 2019-03-12 DIAGNOSIS — D509 Iron deficiency anemia, unspecified: Secondary | ICD-10-CM | POA: Diagnosis not present

## 2019-03-14 DIAGNOSIS — D509 Iron deficiency anemia, unspecified: Secondary | ICD-10-CM | POA: Insufficient documentation

## 2019-03-15 DIAGNOSIS — N186 End stage renal disease: Secondary | ICD-10-CM | POA: Diagnosis not present

## 2019-03-15 DIAGNOSIS — D509 Iron deficiency anemia, unspecified: Secondary | ICD-10-CM | POA: Diagnosis not present

## 2019-03-15 DIAGNOSIS — Z992 Dependence on renal dialysis: Secondary | ICD-10-CM | POA: Diagnosis not present

## 2019-03-15 DIAGNOSIS — D631 Anemia in chronic kidney disease: Secondary | ICD-10-CM | POA: Diagnosis not present

## 2019-03-15 DIAGNOSIS — D689 Coagulation defect, unspecified: Secondary | ICD-10-CM | POA: Diagnosis not present

## 2019-03-15 DIAGNOSIS — E876 Hypokalemia: Secondary | ICD-10-CM | POA: Diagnosis not present

## 2019-03-15 DIAGNOSIS — N2581 Secondary hyperparathyroidism of renal origin: Secondary | ICD-10-CM | POA: Diagnosis not present

## 2019-03-15 DIAGNOSIS — R197 Diarrhea, unspecified: Secondary | ICD-10-CM | POA: Diagnosis not present

## 2019-03-15 DIAGNOSIS — Z23 Encounter for immunization: Secondary | ICD-10-CM | POA: Diagnosis not present

## 2019-03-16 ENCOUNTER — Ambulatory Visit: Payer: Self-pay | Admitting: Pharmacist

## 2019-03-16 ENCOUNTER — Other Ambulatory Visit: Payer: Self-pay | Admitting: Pharmacist

## 2019-03-16 NOTE — Patient Outreach (Signed)
[place] [facility]) Care Management  [place] 03/16/2019  Jason HINZ Sr. [birth year] [id number]  Reason for call: f/u on Viberzi patient assistance program   GI office Vivien Rota) has provided patient with application for Viberzi and spouse is completing at home.  Vivien Rota has also called in RX to pharmacy so spouse can pick up print-out showing co-pay.    Call placed to patient's home today.  Patient answered call.  I reviewed information on program with him and he asks that I speak to his spouse about it.  Spouse is not available right now but he will have her call me later today.    Plan: Will f/u again with patient and spouse later this week if I have not heard back  Ralene Bathe, PharmD, [place] [phone]

## 2019-03-17 ENCOUNTER — Ambulatory Visit: Payer: Self-pay | Admitting: Pharmacist

## 2019-03-17 DIAGNOSIS — D631 Anemia in chronic kidney disease: Secondary | ICD-10-CM | POA: Diagnosis not present

## 2019-03-17 DIAGNOSIS — N2581 Secondary hyperparathyroidism of renal origin: Secondary | ICD-10-CM | POA: Diagnosis not present

## 2019-03-17 DIAGNOSIS — Z992 Dependence on renal dialysis: Secondary | ICD-10-CM | POA: Diagnosis not present

## 2019-03-17 DIAGNOSIS — D689 Coagulation defect, unspecified: Secondary | ICD-10-CM | POA: Diagnosis not present

## 2019-03-17 DIAGNOSIS — Z23 Encounter for immunization: Secondary | ICD-10-CM | POA: Diagnosis not present

## 2019-03-17 DIAGNOSIS — R197 Diarrhea, unspecified: Secondary | ICD-10-CM | POA: Diagnosis not present

## 2019-03-17 DIAGNOSIS — N186 End stage renal disease: Secondary | ICD-10-CM | POA: Diagnosis not present

## 2019-03-17 DIAGNOSIS — E876 Hypokalemia: Secondary | ICD-10-CM | POA: Diagnosis not present

## 2019-03-17 DIAGNOSIS — D509 Iron deficiency anemia, unspecified: Secondary | ICD-10-CM | POA: Diagnosis not present

## 2019-03-18 ENCOUNTER — Other Ambulatory Visit: Payer: Self-pay

## 2019-03-18 ENCOUNTER — Ambulatory Visit (INDEPENDENT_AMBULATORY_CARE_PROVIDER_SITE_OTHER): Payer: Medicare Other | Admitting: General Practice

## 2019-03-18 DIAGNOSIS — Z7901 Long term (current) use of anticoagulants: Secondary | ICD-10-CM | POA: Diagnosis not present

## 2019-03-18 DIAGNOSIS — I4891 Unspecified atrial fibrillation: Secondary | ICD-10-CM

## 2019-03-18 LAB — POCT INR: INR: 2.2 (ref 2.0–3.0)

## 2019-03-18 NOTE — Patient Instructions (Addendum)
Pre visit review using our clinic review tool, if applicable. No additional management support is needed unless otherwise documented below in the visit note.  Continue to take 1 tablet daily except 1/2 tablet on Monday and Thursdays.  Re-check in 3 weeks.

## 2019-03-19 DIAGNOSIS — Z23 Encounter for immunization: Secondary | ICD-10-CM | POA: Diagnosis not present

## 2019-03-19 DIAGNOSIS — R197 Diarrhea, unspecified: Secondary | ICD-10-CM | POA: Diagnosis not present

## 2019-03-19 DIAGNOSIS — Z992 Dependence on renal dialysis: Secondary | ICD-10-CM | POA: Diagnosis not present

## 2019-03-19 DIAGNOSIS — N2581 Secondary hyperparathyroidism of renal origin: Secondary | ICD-10-CM | POA: Diagnosis not present

## 2019-03-19 DIAGNOSIS — D631 Anemia in chronic kidney disease: Secondary | ICD-10-CM | POA: Diagnosis not present

## 2019-03-19 DIAGNOSIS — D509 Iron deficiency anemia, unspecified: Secondary | ICD-10-CM | POA: Diagnosis not present

## 2019-03-19 DIAGNOSIS — D689 Coagulation defect, unspecified: Secondary | ICD-10-CM | POA: Diagnosis not present

## 2019-03-19 DIAGNOSIS — E876 Hypokalemia: Secondary | ICD-10-CM | POA: Diagnosis not present

## 2019-03-19 DIAGNOSIS — N186 End stage renal disease: Secondary | ICD-10-CM | POA: Diagnosis not present

## 2019-03-22 DIAGNOSIS — D689 Coagulation defect, unspecified: Secondary | ICD-10-CM | POA: Diagnosis not present

## 2019-03-22 DIAGNOSIS — Z992 Dependence on renal dialysis: Secondary | ICD-10-CM | POA: Diagnosis not present

## 2019-03-22 DIAGNOSIS — D509 Iron deficiency anemia, unspecified: Secondary | ICD-10-CM | POA: Diagnosis not present

## 2019-03-22 DIAGNOSIS — Z23 Encounter for immunization: Secondary | ICD-10-CM | POA: Diagnosis not present

## 2019-03-22 DIAGNOSIS — N2581 Secondary hyperparathyroidism of renal origin: Secondary | ICD-10-CM | POA: Diagnosis not present

## 2019-03-22 DIAGNOSIS — E876 Hypokalemia: Secondary | ICD-10-CM | POA: Diagnosis not present

## 2019-03-22 DIAGNOSIS — R197 Diarrhea, unspecified: Secondary | ICD-10-CM | POA: Diagnosis not present

## 2019-03-22 DIAGNOSIS — N186 End stage renal disease: Secondary | ICD-10-CM | POA: Diagnosis not present

## 2019-03-22 DIAGNOSIS — D631 Anemia in chronic kidney disease: Secondary | ICD-10-CM | POA: Diagnosis not present

## 2019-03-24 DIAGNOSIS — R197 Diarrhea, unspecified: Secondary | ICD-10-CM | POA: Diagnosis not present

## 2019-03-24 DIAGNOSIS — Z992 Dependence on renal dialysis: Secondary | ICD-10-CM | POA: Diagnosis not present

## 2019-03-24 DIAGNOSIS — E876 Hypokalemia: Secondary | ICD-10-CM | POA: Diagnosis not present

## 2019-03-24 DIAGNOSIS — D689 Coagulation defect, unspecified: Secondary | ICD-10-CM | POA: Diagnosis not present

## 2019-03-24 DIAGNOSIS — D631 Anemia in chronic kidney disease: Secondary | ICD-10-CM | POA: Diagnosis not present

## 2019-03-24 DIAGNOSIS — N186 End stage renal disease: Secondary | ICD-10-CM | POA: Diagnosis not present

## 2019-03-24 DIAGNOSIS — Z23 Encounter for immunization: Secondary | ICD-10-CM | POA: Diagnosis not present

## 2019-03-24 DIAGNOSIS — N2581 Secondary hyperparathyroidism of renal origin: Secondary | ICD-10-CM | POA: Diagnosis not present

## 2019-03-24 DIAGNOSIS — D509 Iron deficiency anemia, unspecified: Secondary | ICD-10-CM | POA: Diagnosis not present

## 2019-03-26 DIAGNOSIS — E876 Hypokalemia: Secondary | ICD-10-CM | POA: Diagnosis not present

## 2019-03-26 DIAGNOSIS — Z23 Encounter for immunization: Secondary | ICD-10-CM | POA: Diagnosis not present

## 2019-03-26 DIAGNOSIS — D509 Iron deficiency anemia, unspecified: Secondary | ICD-10-CM | POA: Diagnosis not present

## 2019-03-26 DIAGNOSIS — N2581 Secondary hyperparathyroidism of renal origin: Secondary | ICD-10-CM | POA: Diagnosis not present

## 2019-03-26 DIAGNOSIS — D689 Coagulation defect, unspecified: Secondary | ICD-10-CM | POA: Diagnosis not present

## 2019-03-26 DIAGNOSIS — R197 Diarrhea, unspecified: Secondary | ICD-10-CM | POA: Diagnosis not present

## 2019-03-26 DIAGNOSIS — Z992 Dependence on renal dialysis: Secondary | ICD-10-CM | POA: Diagnosis not present

## 2019-03-26 DIAGNOSIS — D631 Anemia in chronic kidney disease: Secondary | ICD-10-CM | POA: Diagnosis not present

## 2019-03-26 DIAGNOSIS — N186 End stage renal disease: Secondary | ICD-10-CM | POA: Diagnosis not present

## 2019-03-29 DIAGNOSIS — N2581 Secondary hyperparathyroidism of renal origin: Secondary | ICD-10-CM | POA: Diagnosis not present

## 2019-03-29 DIAGNOSIS — N186 End stage renal disease: Secondary | ICD-10-CM | POA: Diagnosis not present

## 2019-03-29 DIAGNOSIS — E876 Hypokalemia: Secondary | ICD-10-CM | POA: Diagnosis not present

## 2019-03-29 DIAGNOSIS — D689 Coagulation defect, unspecified: Secondary | ICD-10-CM | POA: Diagnosis not present

## 2019-03-29 DIAGNOSIS — Z23 Encounter for immunization: Secondary | ICD-10-CM | POA: Diagnosis not present

## 2019-03-29 DIAGNOSIS — Z992 Dependence on renal dialysis: Secondary | ICD-10-CM | POA: Diagnosis not present

## 2019-03-29 DIAGNOSIS — R197 Diarrhea, unspecified: Secondary | ICD-10-CM | POA: Diagnosis not present

## 2019-03-29 DIAGNOSIS — D509 Iron deficiency anemia, unspecified: Secondary | ICD-10-CM | POA: Diagnosis not present

## 2019-03-29 DIAGNOSIS — D631 Anemia in chronic kidney disease: Secondary | ICD-10-CM | POA: Diagnosis not present

## 2019-03-30 ENCOUNTER — Ambulatory Visit (INDEPENDENT_AMBULATORY_CARE_PROVIDER_SITE_OTHER): Payer: Medicare Other | Admitting: Podiatry

## 2019-03-30 ENCOUNTER — Other Ambulatory Visit: Payer: Self-pay

## 2019-03-30 ENCOUNTER — Encounter: Payer: Self-pay | Admitting: Podiatry

## 2019-03-30 DIAGNOSIS — G629 Polyneuropathy, unspecified: Secondary | ICD-10-CM

## 2019-03-30 DIAGNOSIS — B351 Tinea unguium: Secondary | ICD-10-CM | POA: Diagnosis not present

## 2019-03-30 DIAGNOSIS — M79674 Pain in right toe(s): Secondary | ICD-10-CM | POA: Diagnosis not present

## 2019-03-30 DIAGNOSIS — L84 Corns and callosities: Secondary | ICD-10-CM | POA: Diagnosis not present

## 2019-03-30 DIAGNOSIS — M79675 Pain in left toe(s): Secondary | ICD-10-CM

## 2019-03-30 NOTE — Patient Instructions (Signed)
Peripheral Neuropathy Peripheral neuropathy is a type of nerve damage. It affects nerves that carry signals between the spinal cord and the arms, legs, and the rest of the body (peripheral nerves). It does not affect nerves in the spinal cord or brain. In peripheral neuropathy, one nerve or a group of nerves may be damaged. Peripheral neuropathy is a broad category that includes many specific nerve disorders, like diabetic neuropathy, hereditary neuropathy, and carpal tunnel syndrome. What are the causes? This condition may be caused by:  Diabetes. This is the most common cause of peripheral neuropathy.  Nerve injury.  Pressure or stress on a nerve that lasts a long time.  Lack (deficiency) of B vitamins. This can result from alcoholism, poor diet, or a restricted diet.  Infections.  Autoimmune diseases, such as rheumatoid arthritis and systemic lupus erythematosus.  Nerve diseases that are passed from parent to child (inherited).  Some medicines, such as cancer medicines (chemotherapy).  Poisonous (toxic) substances, such as lead and mercury.  Too little blood flowing to the legs.  Kidney disease.  Thyroid disease. In some cases, the cause of this condition is not known. What are the signs or symptoms? Symptoms of this condition depend on which of your nerves is damaged. Common symptoms include:  Loss of feeling (numbness) in the feet, hands, or both.  Tingling in the feet, hands, or both.  Burning pain.  Very sensitive skin.  Weakness.  Not being able to move a part of the body (paralysis).  Muscle twitching.  Clumsiness or poor coordination.  Loss of balance.  Not being able to control your bladder.  Feeling dizzy.  Sexual problems. How is this diagnosed? Diagnosing and finding the cause of peripheral neuropathy can be difficult. Your health care provider will take your medical history and do a physical exam. A neurological exam will also be done. This  involves checking things that are affected by your brain, spinal cord, and nerves (nervous system). For example, your health care provider will check your reflexes, how you move, and what you can feel. You may have other tests, such as:  Blood tests.  Electromyogram (EMG) and nerve conduction tests. These tests check nerve function and how well the nerves are controlling the muscles.  Imaging tests, such as CT scans or MRI to rule out other causes of your symptoms.  Removing a small piece of nerve to be examined in a lab (nerve biopsy). This is rare.  Removing and examining a small amount of the fluid that surrounds the brain and spinal cord (lumbar puncture). This is rare. How is this treated? Treatment for this condition may involve:  Treating the underlying cause of the neuropathy, such as diabetes, kidney disease, or vitamin deficiencies.  Stopping medicines that can cause neuropathy, such as chemotherapy.  Medicine to relieve pain. Medicines may include: ? Prescription or over-the-counter pain medicine. ? Antiseizure medicine. ? Antidepressants. ? Pain-relieving patches that are applied to painful areas of skin.  Surgery to relieve pressure on a nerve or to destroy a nerve that is causing pain.  Physical therapy to help improve movement and balance.  Devices to help you move around (assistive devices). Follow these instructions at home: Medicines  Take over-the-counter and prescription medicines only as told by your health care provider. Do not take any other medicines without first asking your health care provider.  Do not drive or use heavy machinery while taking prescription pain medicine. Lifestyle   Do not use any products that contain nicotine  or tobacco, such as cigarettes and e-cigarettes. Smoking keeps blood from reaching damaged nerves. If you need help quitting, ask your health care provider.  Avoid or limit alcohol. Too much alcohol can cause a vitamin B  deficiency, and vitamin B is needed for healthy nerves.  Eat a healthy diet. This includes: ? Eating foods that are high in fiber, such as fresh fruits and vegetables, whole grains, and beans. ? Limiting foods that are high in fat and processed sugars, such as fried or sweet foods. General instructions   If you have diabetes, work closely with your health care provider to keep your blood sugar under control.  If you have numbness in your feet: ? Check every day for signs of injury or infection. Watch for redness, warmth, and swelling. ? Wear padded socks and comfortable shoes. These help protect your feet.  Develop a good support system. Living with peripheral neuropathy can be stressful. Consider talking with a mental health specialist or joining a support group.  Use assistive devices and attend physical therapy as told by your health care provider. This may include using a walker or a cane.  Keep all follow-up visits as told by your health care provider. This is important. Contact a health care provider if:  You have new signs or symptoms of peripheral neuropathy.  You are struggling emotionally from dealing with peripheral neuropathy.  Your pain is not well-controlled. Get help right away if:  You have an injury or infection that is not healing normally.  You develop new weakness in an arm or leg.  You fall frequently. Summary  Peripheral neuropathy is when the nerves in the arms, or legs are damaged, resulting in numbness, weakness, or pain.  There are many causes of peripheral neuropathy, including diabetes, pinched nerves, vitamin deficiencies, autoimmune disease, and hereditary conditions.  Diagnosing and finding the cause of peripheral neuropathy can be difficult. Your health care provider will take your medical history, do a physical exam, and do tests, including blood tests and nerve function tests.  Treatment involves treating the underlying cause of the  neuropathy and taking medicines to help control pain. Physical therapy and assistive devices may also help. This information is not intended to replace advice given to you by your health care provider. Make sure you discuss any questions you have with your health care provider. Document Released: 06/13/2002 Document Revised: 06/05/2017 Document Reviewed: 09/01/2016 Elsevier Patient Education  [place]? An infection that lies within the keratin of your nail plate that is caused by a fungus.  WHY ME? Fungal infections affect all ages, sexes, races, and creeds.  There may be many factors that predispose you to a fungal infection such as age, coexisting medical conditions such as diabetes, or an autoimmune disease; stress, medications, fatigue, genetics, etc.  Bottom line: fungus thrives in a warm, moist environment and your shoes offer such a location.  IS IT CONTAGIOUS? Theoretically, yes.  You do not want to share shoes, nail clippers or files with someone who has fungal toenails.  Walking around barefoot in the same room or sleeping in the same bed is unlikely to transfer the organism.  It is important to realize, however, that fungus can spread easily from one nail to the next on the same foot.  HOW DO WE TREAT THIS?  There are several ways to treat this condition.  Treatment may depend on many factors such as age, medications, pregnancy, liver and  kidney conditions, etc.  It is best to ask your doctor which options are available to you.  1. No treatment.   Unlike many other medical concerns, you can live with this condition.  However for many people this can be a painful condition and may lead to ingrown toenails or a bacterial infection.  It is recommended that you keep the nails cut short to help reduce the amount of fungal nail. 2. Topical treatment.  These range from herbal remedies to prescription strength nail lacquers.  About 40-50%  effective, topicals require twice daily application for approximately 9 to 12 months or until an entirely new nail has grown out.  The most effective topicals are medical grade medications available through physicians offices. 3. Oral antifungal medications.  With an 80-90% cure rate, the most common oral medication requires 3 to 4 months of therapy and stays in your system for a year as the new nail grows out.  Oral antifungal medications do require blood work to make sure it is a safe drug for you.  A liver function panel will be performed prior to starting the medication and after the first month of treatment.  It is important to have the blood work performed to avoid any harmful side effects.  In general, this medication safe but blood work is required. 4. Laser Therapy.  This treatment is performed by applying a specialized laser to the affected nail plate.  This therapy is noninvasive, fast, and non-painful.  It is not covered by insurance and is therefore, out of pocket.  The results have been very good with a 80-95% cure rate.  The [facility] is the only practice in the area to offer this therapy. 5. Permanent Nail Avulsion.  Removing the entire nail so that a new nail will not grow back.

## 2019-03-31 DIAGNOSIS — N186 End stage renal disease: Secondary | ICD-10-CM | POA: Diagnosis not present

## 2019-03-31 DIAGNOSIS — D509 Iron deficiency anemia, unspecified: Secondary | ICD-10-CM | POA: Diagnosis not present

## 2019-03-31 DIAGNOSIS — D689 Coagulation defect, unspecified: Secondary | ICD-10-CM | POA: Diagnosis not present

## 2019-03-31 DIAGNOSIS — Z992 Dependence on renal dialysis: Secondary | ICD-10-CM | POA: Diagnosis not present

## 2019-03-31 DIAGNOSIS — R197 Diarrhea, unspecified: Secondary | ICD-10-CM | POA: Diagnosis not present

## 2019-03-31 DIAGNOSIS — E876 Hypokalemia: Secondary | ICD-10-CM | POA: Diagnosis not present

## 2019-03-31 DIAGNOSIS — Z23 Encounter for immunization: Secondary | ICD-10-CM | POA: Diagnosis not present

## 2019-03-31 DIAGNOSIS — D631 Anemia in chronic kidney disease: Secondary | ICD-10-CM | POA: Diagnosis not present

## 2019-03-31 DIAGNOSIS — N2581 Secondary hyperparathyroidism of renal origin: Secondary | ICD-10-CM | POA: Diagnosis not present

## 2019-04-02 DIAGNOSIS — D689 Coagulation defect, unspecified: Secondary | ICD-10-CM | POA: Diagnosis not present

## 2019-04-02 DIAGNOSIS — N186 End stage renal disease: Secondary | ICD-10-CM | POA: Diagnosis not present

## 2019-04-02 DIAGNOSIS — E876 Hypokalemia: Secondary | ICD-10-CM | POA: Diagnosis not present

## 2019-04-02 DIAGNOSIS — D631 Anemia in chronic kidney disease: Secondary | ICD-10-CM | POA: Diagnosis not present

## 2019-04-02 DIAGNOSIS — Z992 Dependence on renal dialysis: Secondary | ICD-10-CM | POA: Diagnosis not present

## 2019-04-02 DIAGNOSIS — R197 Diarrhea, unspecified: Secondary | ICD-10-CM | POA: Diagnosis not present

## 2019-04-02 DIAGNOSIS — Z23 Encounter for immunization: Secondary | ICD-10-CM | POA: Diagnosis not present

## 2019-04-02 DIAGNOSIS — D509 Iron deficiency anemia, unspecified: Secondary | ICD-10-CM | POA: Diagnosis not present

## 2019-04-02 DIAGNOSIS — N2581 Secondary hyperparathyroidism of renal origin: Secondary | ICD-10-CM | POA: Diagnosis not present

## 2019-04-04 DIAGNOSIS — G4733 Obstructive sleep apnea (adult) (pediatric): Secondary | ICD-10-CM | POA: Diagnosis not present

## 2019-04-05 DIAGNOSIS — R197 Diarrhea, unspecified: Secondary | ICD-10-CM | POA: Diagnosis not present

## 2019-04-05 DIAGNOSIS — Z992 Dependence on renal dialysis: Secondary | ICD-10-CM | POA: Diagnosis not present

## 2019-04-05 DIAGNOSIS — D631 Anemia in chronic kidney disease: Secondary | ICD-10-CM | POA: Diagnosis not present

## 2019-04-05 DIAGNOSIS — D509 Iron deficiency anemia, unspecified: Secondary | ICD-10-CM | POA: Diagnosis not present

## 2019-04-05 DIAGNOSIS — D689 Coagulation defect, unspecified: Secondary | ICD-10-CM | POA: Diagnosis not present

## 2019-04-05 DIAGNOSIS — N2581 Secondary hyperparathyroidism of renal origin: Secondary | ICD-10-CM | POA: Diagnosis not present

## 2019-04-05 DIAGNOSIS — N186 End stage renal disease: Secondary | ICD-10-CM | POA: Diagnosis not present

## 2019-04-05 DIAGNOSIS — E876 Hypokalemia: Secondary | ICD-10-CM | POA: Diagnosis not present

## 2019-04-05 DIAGNOSIS — Z23 Encounter for immunization: Secondary | ICD-10-CM | POA: Diagnosis not present

## 2019-04-05 NOTE — Progress Notes (Signed)
Subjective: Jason Cotta Sr. is seen today for follow up painful, elongated, thickened toenails 1-5 b/l feet that he cannot cut. Pain interferes with daily activities. Aggravating factor includes wearing enclosed shoe gear and relieved with periodic debridement.  He has h/o neuropathy.  Jason [name] voices no new pedal concerns on today's visit.  Current Outpatient Medications on File Prior to Visit  Medication Sig  . acetaminophen (TYLENOL) 325 MG tablet Take by mouth.  . budesonide (ENTOCORT EC) 3 MG 24 hr capsule Take by mouth.  . cinacalcet (SENSIPAR) 30 MG tablet Take by mouth.  . heparin 1000 UNIT/ML injection Heparin Sodium (Porcine) 1,000 Units/mL Systemic  . loperamide (IMODIUM) 2 MG capsule Take by mouth.  . Multiple Vitamin (MULTIVITAMIN) capsule Take by mouth.  . sucroferric oxyhydroxide (VELPHORO) 500 MG chewable tablet Chew by mouth.  Marland Kitchen allopurinol (ZYLOPRIM) 100 MG tablet Take 1 tablet by mouth once daily (Patient taking differently: Take 100 mg by mouth daily. )  . atorvastatin (LIPITOR) 20 MG tablet TAKE 1 TABLET BY MOUTH  DAILY AT 6 PM. (Patient taking differently: Take 20 mg by mouth daily at 6 PM. )  . Eluxadoline (VIBERZI) 75 MG TABS Take 75 mg by mouth 2 (two) times daily.  Marland Kitchen enoxaparin (LOVENOX) 60 MG/0.6ML injection Inject 0.6 mLs (60 mg total) into the skin daily for 7 doses.  . ferric citrate (AURYXIA) 1 GM 210 MG(Fe) tablet Take 210 mg by mouth See admin instructions. Take 210 mg with breakfast, 210 mg with dinner, and 210 mg with a snack at bedtime  . Homeopathic Products (THERAWORX RELIEF EX) Apply 1 application topically daily as needed (pain).  Marland Kitchen lipase/protease/amylase (CREON) 36000 UNITS CPEP capsule Take 1 capsule (36,000 Units total) by mouth 3 (three) times daily with meals. (Patient taking differently: Take 36,000-72,000 Units by mouth See admin instructions. Take 72000 units with breakfast and 36000 units with dinner)  . Menthol (ICY HOT) 5 % PTCH Apply 1  patch topically daily as needed (pain).  . metoprolol succinate (TOPROL-XL) 25 MG 24 hr tablet Take 1 tablet (25 mg total) by mouth daily.  Marland Kitchen warfarin (COUMADIN) 5 MG tablet TAKE 1 TABLET DAILY OR AS DIRECTED BY ANTICOAGULATION CLINIC (Patient taking differently: Take 2.5-5 mg by mouth See admin instructions. Take 2.5 mg in the morning on Mon and Thurs.  Take 5 mg in the morning on Sun, Tue, Wed, Fri, and Sat.)   No current facility-administered medications on file prior to visit.      Allergies  Allergen Reactions  . Nsaids Other (See Comments)    Asked by surgeon to add this medication class as intolerance due to patients renal insufficiency.  . Lactase Other (See Comments)  . Lactose Intolerance (Gi) Diarrhea  . Phentermine Other (See Comments)  . Amlodipine Other (See Comments)    Edema      Objective:  Vascular Examination: Capillary refill time <3 seconds x 10 digits.  Dorsalis pedis present b/l.  Posterior tibial pulses present b/l.  Digital hair absent x 10 digits.  Skin temperature gradient WNL b/l.   Dermatological Examination: Skin thin, shiny and atrophic b/l.  Toenails 1-5 b/l discolored, thick, dystrophic with subungual debris and pain with palpation to nailbeds due to thickness of nails.  Hyperkeratotic lesion dorsal 5th PIPJ with tenderness to palpation. No edema, no erythema, no drainage, no flocculence.  Musculoskeletal: Muscle strength 5/5 to all LE muscle groups.  Bunion deformity right>left.  Hammertoe deformity 5th digit b/l.  No pain, crepitus  or joint limitation noted with ROM.   Neurological Examination: Protective sensation diminished with 10 gram monofilament bilaterally.  Vibratory sensation diminished bilaterally.   Assessment: Painful onychomycosis toenails 1-5 b/l  Corn right 5th digit Neuropathy  Plan: 1. Toenails 1-5 b/l were debrided in length and girth without iatrogenic bleeding.   2. Corn right 5th digit pared utilizing  sterile scalpel blade without incident. 3. Patient to continue soft, supportive shoe gear 4. Patient to report any pedal injuries to medical professional immediately. 5. Follow up 3 months.  6. Patient/POA to call should there be a concern in the interim.

## 2019-04-06 DIAGNOSIS — N186 End stage renal disease: Secondary | ICD-10-CM | POA: Diagnosis not present

## 2019-04-06 DIAGNOSIS — I129 Hypertensive chronic kidney disease with stage 1 through stage 4 chronic kidney disease, or unspecified chronic kidney disease: Secondary | ICD-10-CM | POA: Diagnosis not present

## 2019-04-06 DIAGNOSIS — Z992 Dependence on renal dialysis: Secondary | ICD-10-CM | POA: Diagnosis not present

## 2019-04-07 DIAGNOSIS — E876 Hypokalemia: Secondary | ICD-10-CM | POA: Diagnosis not present

## 2019-04-07 DIAGNOSIS — R197 Diarrhea, unspecified: Secondary | ICD-10-CM | POA: Diagnosis not present

## 2019-04-07 DIAGNOSIS — N186 End stage renal disease: Secondary | ICD-10-CM | POA: Diagnosis not present

## 2019-04-07 DIAGNOSIS — Z992 Dependence on renal dialysis: Secondary | ICD-10-CM | POA: Diagnosis not present

## 2019-04-07 DIAGNOSIS — Z23 Encounter for immunization: Secondary | ICD-10-CM | POA: Diagnosis not present

## 2019-04-07 DIAGNOSIS — N2581 Secondary hyperparathyroidism of renal origin: Secondary | ICD-10-CM | POA: Diagnosis not present

## 2019-04-07 DIAGNOSIS — D689 Coagulation defect, unspecified: Secondary | ICD-10-CM | POA: Diagnosis not present

## 2019-04-08 ENCOUNTER — Ambulatory Visit (INDEPENDENT_AMBULATORY_CARE_PROVIDER_SITE_OTHER): Payer: Medicare Other | Admitting: General Practice

## 2019-04-08 ENCOUNTER — Ambulatory Visit: Payer: Self-pay | Admitting: *Deleted

## 2019-04-08 ENCOUNTER — Other Ambulatory Visit: Payer: Self-pay

## 2019-04-08 ENCOUNTER — Ambulatory Visit: Payer: Medicare Other

## 2019-04-08 ENCOUNTER — Encounter (HOSPITAL_COMMUNITY): Payer: Self-pay

## 2019-04-08 ENCOUNTER — Emergency Department (HOSPITAL_COMMUNITY)
Admission: EM | Admit: 2019-04-08 | Discharge: 2019-04-08 | Disposition: A | Discharge status: Home / Self Care | Payer: Medicare Other | Attending: Emergency Medicine | Admitting: Emergency Medicine

## 2019-04-08 ENCOUNTER — Emergency Department (HOSPITAL_COMMUNITY): Payer: Medicare Other

## 2019-04-08 ENCOUNTER — Telehealth: Payer: Self-pay | Admitting: Gastroenterology

## 2019-04-08 DIAGNOSIS — Z87891 Personal history of nicotine dependence: Secondary | ICD-10-CM | POA: Diagnosis not present

## 2019-04-08 DIAGNOSIS — N186 End stage renal disease: Secondary | ICD-10-CM | POA: Diagnosis not present

## 2019-04-08 DIAGNOSIS — I132 Hypertensive heart and chronic kidney disease with heart failure and with stage 5 chronic kidney disease, or end stage renal disease: Secondary | ICD-10-CM | POA: Diagnosis not present

## 2019-04-08 DIAGNOSIS — Z902 Acquired absence of lung [part of]: Secondary | ICD-10-CM | POA: Insufficient documentation

## 2019-04-08 DIAGNOSIS — I509 Heart failure, unspecified: Secondary | ICD-10-CM | POA: Insufficient documentation

## 2019-04-08 DIAGNOSIS — Z85048 Personal history of other malignant neoplasm of rectum, rectosigmoid junction, and anus: Secondary | ICD-10-CM | POA: Diagnosis not present

## 2019-04-08 DIAGNOSIS — I120 Hypertensive chronic kidney disease with stage 5 chronic kidney disease or end stage renal disease: Secondary | ICD-10-CM | POA: Diagnosis not present

## 2019-04-08 DIAGNOSIS — Z79899 Other long term (current) drug therapy: Secondary | ICD-10-CM | POA: Diagnosis not present

## 2019-04-08 DIAGNOSIS — J984 Other disorders of lung: Secondary | ICD-10-CM | POA: Diagnosis not present

## 2019-04-08 DIAGNOSIS — I4891 Unspecified atrial fibrillation: Secondary | ICD-10-CM

## 2019-04-08 DIAGNOSIS — Z7901 Long term (current) use of anticoagulants: Secondary | ICD-10-CM | POA: Insufficient documentation

## 2019-04-08 DIAGNOSIS — J449 Chronic obstructive pulmonary disease, unspecified: Secondary | ICD-10-CM | POA: Diagnosis not present

## 2019-04-08 DIAGNOSIS — Z992 Dependence on renal dialysis: Secondary | ICD-10-CM | POA: Diagnosis not present

## 2019-04-08 DIAGNOSIS — R339 Retention of urine, unspecified: Secondary | ICD-10-CM | POA: Diagnosis not present

## 2019-04-08 LAB — CBC WITH DIFFERENTIAL/PLATELET
Abs Immature Granulocytes: 0.02 10*3/uL (ref 0.00–0.07)
Basophils Absolute: 0 10*3/uL (ref 0.0–0.1)
Basophils Relative: 1 %
Eosinophils Absolute: 0 10*3/uL (ref 0.0–0.5)
Eosinophils Relative: 1 %
HCT: 42.2 % (ref 39.0–52.0)
Hemoglobin: 13.2 g/dL (ref 13.0–17.0)
Immature Granulocytes: 0 %
Lymphocytes Relative: 17 %
Lymphs Abs: 1.1 10*3/uL (ref 0.7–4.0)
MCH: 30.4 pg (ref 26.0–34.0)
MCHC: 31.3 g/dL (ref 30.0–36.0)
MCV: 97.2 fL (ref 80.0–100.0)
Monocytes Absolute: 1.1 10*3/uL — ABNORMAL HIGH (ref 0.1–1.0)
Monocytes Relative: 16 %
Neutro Abs: 4.2 10*3/uL (ref 1.7–7.7)
Neutrophils Relative %: 65 %
Platelets: 179 10*3/uL (ref 150–400)
RBC: 4.34 MIL/uL (ref 4.22–5.81)
RDW: 14.1 % (ref 11.5–15.5)
WBC: 6.4 10*3/uL (ref 4.0–10.5)
nRBC: 0 % (ref 0.0–0.2)

## 2019-04-08 LAB — BASIC METABOLIC PANEL
Anion gap: 15 (ref 5–15)
BUN: 27 mg/dL — ABNORMAL HIGH (ref 8–23)
CO2: 25 mmol/L (ref 22–32)
Calcium: 10.5 mg/dL — ABNORMAL HIGH (ref 8.9–10.3)
Chloride: 98 mmol/L (ref 98–111)
Creatinine, Ser: 6.84 mg/dL — ABNORMAL HIGH (ref 0.61–1.24)
GFR calc Af Amer: 8 mL/min — ABNORMAL LOW (ref >60)
GFR calc non Af Amer: 7 mL/min — ABNORMAL LOW (ref >60)
Glucose, Bld: 83 mg/dL (ref 70–99)
Potassium: 3.7 mmol/L (ref 3.5–5.1)
Sodium: 138 mmol/L (ref 135–145)

## 2019-04-08 LAB — POCT INR: INR: 2.2 (ref 2.0–3.0)

## 2019-04-08 NOTE — ED Notes (Signed)
MD made aware of 41ml noted in bladder x3

## 2019-04-08 NOTE — Telephone Encounter (Signed)
Pt called in saying,   "I need my fluid pill".  He sounded short of breath.   He denied being short of breath.  He was speaking in short phrases.  I asked him what he needed the fluid pill for.    He responded,  "I can't pee".  I asked him how long it had been since he urinated last and he responded,  "2-3 weeks".    I repeated 2-3 weeks?   He said,  "Yes".    I asked if he had a problem with his kidneys or if he was on dialysis.   He responded,  "I'm on dialysis".   His last treatment was yesterday.  I asked if he normally makes any urine and all he could tell me was it had been a while and he had not peed.   "I just need a fluid pill"    I called into Dr. Ronnald Ramp' office and spoke Brittney.    I let her know the situation.  "He was just here"  He didn't mention anything about that.   It was decided with all he had going on with being on dialysis, having CHF that he needed to go to the ED.   (It sounded like Mr. Minahan was outside because I could hear traffic and a beeping sound from construction equipment in the background).  I got back on the phone with Mr. Bedrosian.   I let him know he needed to go to the ED.   "I just need my fluid pill".    (There was not a fluid pill listed on his chart).   At this point his wife Jatniel Verastegui got on the phone.  I asked her to clarify what was happening with Mr. Ratz because the office said he was just there and I'm not sure I'm getting a clear picture of what's going on. She said he is on dialysis and normally makes some urine but that has stopped.   The shortness of breath is his normal with his heart problem and kidney problem.   "He always sounds like that.  I let her know he needed to go to the ED.   At first she said they would wait until tomorrow at his dialysis treatment and tell them what's going on.   I let her know if he normally makes urine and has not urinated in 2-3 weeks he really needed to go on to the ED.   They may need to drain his bladder.   He  could also end up with a really bad infection.   [name] agreed to take him to the ED at this point.   She told Alexsander in the background and he agreed too that they would go to the ED.     They weren't sure which one they would go to but that they are going.    As I hung up the line I heard them talking about going to [facility] ED.   Reason for Disposition . [1] Longstanding difficulty breathing (e.g., CHF, COPD, emphysema) AND [2] WORSE than normal  Answer Assessment - Initial Assessment Questions 1. RESPIRATORY STATUS: "Describe your breathing?" (e.g., wheezing, shortness of breath, unable to speak, severe coughing)      I just need a fluid pill.    I was taking it.   I'm not urinating like I used to.   It's been 2-3 weeks since I've urinated.   He is on dialysis.  Last treatment yesterday.     Denies ankle swelling.      He sounds a little short of breath.   2. ONSET: "When did this breathing problem begin?"      His weight has gone up but he doesn't know by how much.   3. PATTERN "Does the difficult breathing come and go, or has it been constant since it started?"      Dr. Posey Pronto with [facility] is my dr.    4. SEVERITY: "How bad is your breathing?" (e.g., mild, moderate, severe)    - MILD: No SOB at rest, mild SOB with walking, speaks normally in sentences, can lay down, no retractions, pulse < 100.    - MODERATE: SOB at rest, SOB with minimal exertion and prefers to sit, cannot lie down flat, speaks in phrases, mild retractions, audible wheezing, pulse 100-120.    - SEVERE: Very SOB at rest, speaks in single words, struggling to breathe, sitting hunched forward, retractions, pulse > 120      Severe   Speaking in short sentences 5. RECURRENT SYMPTOM: "Have you had difficulty breathing before?" If so, ask: "When was the last time?" and "What happened that time?"      He says this is normal for him the shortness of breath.   6. CARDIAC HISTORY: "Do you have any history of heart  disease?" (e.g., heart attack, angina, bypass surgery, angioplasty)      *No Answer* 7. LUNG HISTORY: "Do you have any history of lung disease?"  (e.g., pulmonary embolus, asthma, emphysema)     No lung problems 8. CAUSE: "What do you think is causing the breathing problem?"      I don't know. 9. OTHER SYMPTOMS: "Do you have any other symptoms? (e.g., dizziness, runny nose, cough, chest pain, fever)     No. 10. PREGNANCY: "Is there any chance you are pregnant?" "When was your last menstrual period?"       N/A 11. TRAVEL: "Have you traveled out of the country in the last month?" (e.g., travel history, exposures)       No  Protocols used: BREATHING DIFFICULTY-A-AH

## 2019-04-08 NOTE — ED Notes (Signed)
BLADDER SCAN RESULTED 47mL's SCAN WAS DONE THREE TIMES

## 2019-04-08 NOTE — Discharge Instructions (Addendum)
Follow up with dialysis in the morning as scheduled.

## 2019-04-08 NOTE — Telephone Encounter (Signed)
Lady called regarding PA for [employer].  [phone] code [id number]

## 2019-04-08 NOTE — Patient Instructions (Addendum)
Pre visit review using our clinic review tool, if applicable. No additional management support is needed unless otherwise documented below in the visit note.  Continue to take 1 tablet daily except 1/2 tablet on Monday and Thursdays.  Re-check in 4 weeks.

## 2019-04-08 NOTE — ED Triage Notes (Signed)
Patient states he has not urinated in 2-3 weeks. Patient states he has been on dialysis since 06/2018. Patient states he called his PCP and was told to come to the ED

## 2019-04-08 NOTE — Progress Notes (Signed)
Medical screening examination/treatment/procedure(s) were performed by non-physician practitioner and as supervising physician I was immediately available for consultation/collaboration. I agree with above. Santana Gosdin, MD   

## 2019-04-08 NOTE — Telephone Encounter (Signed)
Left message to call.

## 2019-04-08 NOTE — ED Provider Notes (Signed)
[facility] DEPT Provider Note   CSN: [id number] Arrival date & time: 04/08/19  1357     History   Chief Complaint Chief Complaint  Patient presents with  . Urinary Retention    HPI Jason FOTI Sr. is a 73 y.o. male.     Pt presents to the ED today with no urination for 2-3 weeks.  Pt is on dialysis (Tues, Th, Sat).  The pt called his doctor today.  They told him to come to the ED so we can make sure his bladder is not blocked.  Pt denies any sob.  He has a little lower abdominal pain, but not much.     Past Medical History:  Diagnosis Date  . Anemia   . Arthritis   . Chronic renal insufficiency 07/31/2011  . COPD (chronic obstructive pulmonary disease) ([place])    pt denies  . CVA (cerebral vascular accident) ([place]) 06/2018  . Deficiency anemia 07/20/2012  . Diverticulosis   . Edema 09/21/2010   Qualifier: Diagnosis of  By: Jerold Coombe    . ERECTILE DYSFUNCTION, MILD 05/11/2007   Qualifier: Diagnosis of  By: Sherren Mocha MD, Jory Ee   . Exertional dyspnea 10/30/2015  . GOUT 05/11/2007   Qualifier: Diagnosis of  By: Sherren Mocha MD, Jory Ee   . Hereditary and idiopathic peripheral neuropathy 09/14/2014  . History of radiation therapy 02/21/13-03/31/13   rectum 50.4Gy total dose  . Hypersomnia 10/30/2015  . Hypertension   . Lung nodule 10/30/2015  . OAB (overactive bladder) 01/09/2016  . Obesity (BMI 30.0-34.9) 10/30/2015  . Obstructive sleep apnea 07/29/2010   HST 11/2015 AHI 54.  On autocpap  >> 10 cm   . Peripheral edema 09/19/2013  . rectal ca dx'd 02/01/13   rectal. Radiation and chemotherapy- remains on chemotherapy-next tx. 08-22-13.   . S/P partial lobectomy of lung 11/20/2015  . S/P thoracotomy   . SUI (stress urinary incontinence), male 01/09/2016    Patient Active Problem List   Diagnosis Date Noted  . Abnormal finding on GI tract imaging   . Long term (current) use of anticoagulants 12/31/2018  . Atrial fibrillation, unspecified type ([place])  12/22/2018  . Longstanding persistent atrial fibrillation ([place]) 09/20/2018  . Chronic diarrhea   . Abnormal loss of weight   . Functional diarrhea 07/14/2018  . Benign essential HTN   . Hyperglycemia   . ESRD on dialysis ([place]) 06/16/2018  . Mild malnutrition ([place]) 06/16/2018  . Stroke due to embolism ([place]) 06/16/2018  . CVA (cerebral vascular accident) ([place]) 06/08/2018  . CHF (congestive heart failure) ([place]) 05/08/2018  . Hyperlipidemia LDL goal <70 09/23/2017  . OAB (overactive bladder) 01/09/2016  . SUI (stress urinary incontinence), male 01/09/2016  . Hypersomnia 10/30/2015  . Obesity (BMI 30.0-34.9) 10/30/2015  . Routine general medical examination at a health care facility 06/04/2015  . Medicare annual wellness visit, subsequent 06/04/2015  . Hereditary and idiopathic peripheral neuropathy 09/14/2014  . Rectal cancer ([place]) 02/01/2013  . Obstructive sleep apnea 07/29/2010  . GOUT 05/11/2007  . ERECTILE DYSFUNCTION, MILD 05/11/2007  . Essential hypertension 05/11/2007    Past Surgical History:  Procedure Laterality Date  . A/V FISTULAGRAM Left 06/14/2018   Procedure: A/V FISTULAGRAM;  Surgeon: Waynetta [facility], MD;  Location: [facility];  Service: Cardiovascular;  Laterality: Left;  . AV FISTULA PLACEMENT Left 03/24/2016   Procedure: CREATION OF LEFT BRACIOCEPHALIC ARTERIOVENOUS (AV) FISTULA;  Surgeon: Elam Dutch, MD;  Location: [facility];  Service:  Vascular;  Laterality: Left;  . BIOPSY  09/17/2018   Procedure: BIOPSY;  Surgeon: Doran Stabler, MD;  Location: [facility];  Service: Gastroenterology;;  . BIOPSY  03/09/2019   Procedure: BIOPSY;  Surgeon: Doran Stabler, MD;  Location: [facility];  Service: Gastroenterology;;  . COLON SURGERY  05/20/2013  . COLONOSCOPY  2010   [facility]  . COLONOSCOPY WITH PROPOFOL N/A 09/17/2018   Procedure: COLONOSCOPY WITH PROPOFOL;  Surgeon: Doran Stabler, MD;  Location: [facility];  Service:  Gastroenterology;  Laterality: N/A;  . ESOPHAGOGASTRODUODENOSCOPY (EGD) WITH PROPOFOL N/A 09/17/2018   Procedure: ESOPHAGOGASTRODUODENOSCOPY (EGD) WITH PROPOFOL;  Surgeon: Doran Stabler, MD;  [facility];  Service: Gastroenterology;  Laterality: N/A;  . ESOPHAGOGASTRODUODENOSCOPY (EGD) WITH PROPOFOL N/A 03/09/2019   Procedure: ESOPHAGOGASTRODUODENOSCOPY (EGD) WITH PROPOFOL;  Surgeon: Doran Stabler, MD;  [facility];  Service: Gastroenterology;  Laterality: N/A;  . EUS N/A 02/03/2013   Procedure: LOWER ENDOSCOPIC ULTRASOUND (EUS);  Surgeon: Milus Banister, MD;  Location: [facility];  Service: Endoscopy;  Laterality: N/A;  . ILEOSTOMY CLOSURE N/A 08/24/2013   Procedure: CLOSURE OF LOOP ILEOSTOMY ;  Surgeon: Leighton Ruff, MD;  [facility]: [facility];  Service: General;  Laterality: N/A;  . IR ANGIO VERTEBRAL SEL SUBCLAVIAN INNOMINATE BILAT MOD SED  06/08/2018  . IR CT HEAD LTD  06/08/2018  . IR INTRAVSC STENT CERV CAROTID W/O EMB-PROT MOD SED INC ANGIO  06/08/2018  . IR PERCUTANEOUS ART THROMBECTOMY/INFUSION INTRACRANIAL INC DIAG ANGIO  06/08/2018  . LAPAROSCOPIC LOW ANTERIOR RESECTION N/A 05/20/2013   Procedure: LAPAROSCOPIC LOW ANTERIOR RESECTION WITH SPLENIC FLEXURE MOBILIZATION;  Surgeon: Leighton Ruff, MD;  Location: [facility];  Service: General;  Laterality: N/A;  . OSTOMY N/A 05/20/2013   Procedure: diverting OSTOMY;  Surgeon: Leighton Ruff, MD;  Location[facility];  Service: General;  Laterality: N/A;  . PERIPHERAL VASCULAR INTERVENTION Left 06/14/2018   Procedure: PERIPHERAL VASCULAR INTERVENTION;  Surgeon: Waynetta [facility], MD;  Location: [facility];  Service: Cardiovascular;  Laterality: Left;  AVF on the Left  . RADIOLOGY WITH ANESTHESIA N/A 06/08/2018   Procedure: IR WITH ANESTHESIA;  Surgeon: Radiologist, Medication, MD;  Location: [facility];  Service: Radiology;  Laterality: N/A;  . VIDEO ASSISTED THORACOSCOPY (VATS)/WEDGE RESECTION Left 11/20/2015    Procedure: VIDEO ASSISTED THORACOSCOPY (VATS)/LUNG RESECTION;  Surgeon: [name], MD;  Location: [facility];  Service: Thoracic;  Laterality: Left;  Marland Kitchen VIDEO BRONCHOSCOPY N/A 11/20/2015   Procedure: VIDEO BRONCHOSCOPY;  Surgeon: [name], MD;  Location[facility];  Service: Thoracic;  Laterality: N/A;        Home Medications    Prior to Admission medications   Medication Sig Start Date End Date Taking? Authorizing Provider  acetaminophen (TYLENOL) 325 MG tablet Take by mouth. 07/03/18  Yes [provider]  allopurinol (ZYLOPRIM) 100 MG tablet Take 1 tablet by mouth once daily Patient taking differently: Take 100 mg by mouth daily.  11/03/18  Yes [name], MD  atorvastatin (LIPITOR) 20 MG tablet TAKE 1 TABLET BY MOUTH  DAILY AT 6 PM. Patient taking differently: Take 20 mg by mouth daily at 6 PM.  02/09/19  Yes [name], MD  budesonide (ENTOCORT EC) 3 MG 24 hr capsule Take by mouth. 10/26/18  Yes [provider]  cinacalcet (SENSIPAR) 30 MG tablet Take by mouth. 03/15/19  Yes [provider]  Eluxadoline (VIBERZI) 75 MG TABS Take 75 mg by  mouth 2 (two) times daily. 03/11/19  Yes Danis, Kirke Corin, MD  ferric citrate (AURYXIA) 1 GM 210 MG(Fe) tablet Take 210 mg by mouth See admin instructions. Take 210 mg with breakfast, 210 mg with dinner, and 210 mg with a snack at bedtime   Yes [provider]  lipase/protease/amylase (CREON) 36000 UNITS CPEP capsule Take 1 capsule (36,000 Units total) by mouth 3 (three) times daily with meals. Patient taking differently: Take 36,000-72,000 Units by mouth See admin instructions. Take 72000 units with breakfast and 36000 units with dinner 12/24/18  Yes Danis, Kirke Corin, MD  loperamide (IMODIUM) 2 MG capsule Take by mouth. 01/25/19 01/24/20 Yes [provider]  Menthol (ICY HOT) 5 % PTCH Apply 1 patch topically daily as needed (pain).   Yes [provider]  metoprolol succinate (TOPROL-XL) 25  MG 24 hr tablet Take 1 tablet (25 mg total) by mouth daily. 09/20/18  Yes [name], MD  Multiple Vitamin (MULTIVITAMIN) capsule Take by mouth. 08/26/18  Yes [provider]  sucroferric oxyhydroxide (VELPHORO) 500 MG chewable tablet Chew by mouth. 03/15/19  Yes [provider]  warfarin (COUMADIN) 5 MG tablet TAKE 1 TABLET DAILY OR AS DIRECTED BY ANTICOAGULATION CLINIC Patient taking differently: Take 2.5-5 mg by mouth See admin instructions. Take 2.5 mg in the morning on Mon and Thurs.  Take 5 mg in the morning on Sun, Tue, Wed, Fri, and Sat. 01/23/19  Yes [name], MD  enoxaparin (LOVENOX) 60 MG/0.6ML injection Inject 0.6 mLs (60 mg total) into the skin daily for 7 doses. 03/03/19 03/10/19  [name], MD    Family History Family History  Problem Relation Age of Onset  . Stroke Mother   . Hypertension Mother   . Stroke Father   . Hypertension Father   . Hypertension Other   . Colon cancer Neg Hx     Social History Social History   Tobacco Use  . Smoking status: Former Smoker    Packs/day: 0.50    Years: 10.00    Pack years: 5.00    Types: Cigarettes    Quit date: 07/07/1977    Years since quitting: 41.7  . Smokeless tobacco: Never Used  Substance Use Topics  . Alcohol use: Yes    Alcohol/week: 0.0 standard drinks    Comment: drinks occasionally  . Drug use: No     Allergies   Nsaids, Lactase, Lactose intolerance (gi), Phentermine, and Amlodipine   Review of Systems Review of Systems  Genitourinary: Positive for difficulty urinating.  All other systems reviewed and are negative.    Physical Exam Updated Vital Signs BP 131/69   Pulse 82   Temp 98.8 F (37.1 C) (Oral)   Resp 16   Ht 5\' 8"  (1.727 m)   Wt 66 kg   SpO2 100%   BMI 22.14 kg/m   Physical Exam Vitals signs and nursing note reviewed.  Constitutional:      Appearance: Normal appearance.  HENT:     Head: Normocephalic and atraumatic.     Right Ear: External ear  normal.     Left Ear: External ear normal.     Nose: Nose normal.     Mouth/Throat:     Mouth: Mucous membranes are moist.     Pharynx: Oropharynx is clear.  Eyes:     Extraocular Movements: Extraocular movements intact.     Conjunctiva/sclera: Conjunctivae normal.     Pupils: Pupils are equal, round, and reactive  to light.  Neck:     Musculoskeletal: Normal range of motion and neck supple.  Cardiovascular:     Rate and Rhythm: Normal rate and regular rhythm.     Pulses: Normal pulses.     Heart sounds: Normal heart sounds.  Pulmonary:     Effort: Pulmonary effort is normal.     Breath sounds: Normal breath sounds.  Abdominal:     General: Abdomen is flat. Bowel sounds are normal.     Palpations: Abdomen is soft.  Musculoskeletal: Normal range of motion.     Comments: Left forearm AVF with good thrill.  Skin:    General: Skin is warm.     Capillary Refill: Capillary refill takes less than 2 seconds.  Neurological:     General: No focal deficit present.     Mental Status: He is alert and oriented to person, place, and time.  Psychiatric:        Mood and Affect: Mood normal.        Behavior: Behavior normal.      ED Treatments / Results  Labs (all labs ordered are listed, but only abnormal results are displayed) Labs Reviewed  BASIC METABOLIC PANEL - Abnormal; Notable for the following components:      Result Value   BUN 27 (*)    Creatinine, Ser 6.84 (*)    Calcium 10.5 (*)    GFR calc non Af Amer 7 (*)    GFR calc Af Amer 8 (*)    All other components within normal limits  CBC WITH DIFFERENTIAL/PLATELET - Abnormal; Notable for the following components:   Monocytes Absolute 1.1 (*)    All other components within normal limits    EKG None  Radiology Dg Chest 2 View  Result Date: 04/08/2019 CLINICAL DATA:  Dialysis patient has not urinated for the past 2-3 weeks. Ex-smoker. History of a lung nodule. EXAM: CHEST - 2 VIEW COMPARISON:  06/10/2018. Chest CT dated  08/11/2018. FINDINGS: Normal sized heart. Clear lungs. Stable left upper lobe staple line with associated soft tissue density scarring. Left subclavian stent. Thoracic spine degenerative changes and mild scoliosis. Old, healed left rib fracture. IMPRESSION: No acute abnormality. Stable left upper lobe scarring. Electronically Signed   By: Claudie Revering M.D.   On: 04/08/2019 19:17    Procedures Procedures (including critical care time)  Medications Ordered in ED Medications - No data to display   Initial Impression / Assessment and Plan / ED Course  I have reviewed the triage vital signs and the nursing notes.  Pertinent labs & imaging results that were available during my care of the patient were reviewed by me and considered in my medical decision making (see chart for details).        Bladder scan done and pt has no urine in his bladder.  Prior to no urination, he only urinated a small amt every other day.  I think his lack of urination is due to ESRD as opposed to urinary retention.  Pt is stable for d/c.  F/u with dialysis in the morning as scheduled.  Final Clinical Impressions(s) / ED Diagnoses   Final diagnoses:  ESRD on hemodialysis [facility])    ED Discharge Orders    None       Isla Pence, MD 04/08/19 [phone]

## 2019-04-09 DIAGNOSIS — D689 Coagulation defect, unspecified: Secondary | ICD-10-CM | POA: Diagnosis not present

## 2019-04-09 DIAGNOSIS — N186 End stage renal disease: Secondary | ICD-10-CM | POA: Diagnosis not present

## 2019-04-09 DIAGNOSIS — N2581 Secondary hyperparathyroidism of renal origin: Secondary | ICD-10-CM | POA: Diagnosis not present

## 2019-04-09 DIAGNOSIS — R197 Diarrhea, unspecified: Secondary | ICD-10-CM | POA: Diagnosis not present

## 2019-04-09 DIAGNOSIS — E876 Hypokalemia: Secondary | ICD-10-CM | POA: Diagnosis not present

## 2019-04-09 DIAGNOSIS — Z992 Dependence on renal dialysis: Secondary | ICD-10-CM | POA: Diagnosis not present

## 2019-04-09 DIAGNOSIS — Z23 Encounter for immunization: Secondary | ICD-10-CM | POA: Diagnosis not present

## 2019-04-12 ENCOUNTER — Telehealth: Payer: Self-pay | Admitting: Gastroenterology

## 2019-04-12 DIAGNOSIS — N2581 Secondary hyperparathyroidism of renal origin: Secondary | ICD-10-CM | POA: Diagnosis not present

## 2019-04-12 DIAGNOSIS — Z23 Encounter for immunization: Secondary | ICD-10-CM | POA: Diagnosis not present

## 2019-04-12 DIAGNOSIS — E876 Hypokalemia: Secondary | ICD-10-CM | POA: Diagnosis not present

## 2019-04-12 DIAGNOSIS — R197 Diarrhea, unspecified: Secondary | ICD-10-CM | POA: Diagnosis not present

## 2019-04-12 DIAGNOSIS — N186 End stage renal disease: Secondary | ICD-10-CM | POA: Diagnosis not present

## 2019-04-12 DIAGNOSIS — Z992 Dependence on renal dialysis: Secondary | ICD-10-CM | POA: Diagnosis not present

## 2019-04-12 DIAGNOSIS — D689 Coagulation defect, unspecified: Secondary | ICD-10-CM | POA: Diagnosis not present

## 2019-04-13 ENCOUNTER — Other Ambulatory Visit: Payer: Self-pay | Admitting: Pharmacist

## 2019-04-13 ENCOUNTER — Other Ambulatory Visit: Payer: Self-pay

## 2019-04-13 ENCOUNTER — Encounter: Payer: Self-pay | Admitting: Internal Medicine

## 2019-04-13 ENCOUNTER — Ambulatory Visit (INDEPENDENT_AMBULATORY_CARE_PROVIDER_SITE_OTHER): Payer: Medicare Other | Admitting: Internal Medicine

## 2019-04-13 VITALS — BP 110/70 | HR 76 | Temp 98.0°F | Resp 16 | Ht 68.0 in | Wt 145.0 lb

## 2019-04-13 DIAGNOSIS — I5020 Unspecified systolic (congestive) heart failure: Secondary | ICD-10-CM | POA: Diagnosis not present

## 2019-04-13 DIAGNOSIS — I10 Essential (primary) hypertension: Secondary | ICD-10-CM

## 2019-04-13 NOTE — Patient Outreach (Signed)
[name]) Care Management [name]  04/13/2019  Jason [name] Sr. 1945-08-14 [id number]  Communication received from Vivien Rota, [place], with GI provider office.  Office with working closely with patient to apply to [employer] patient assistance program for [employer].  Medication has been denied by insurance plan for PA.  Vivien Rota has reached out to patient who will stop by the office to complete application.   Plan: -Will close [facility] pharmacy case to avoid duplication of services.  Am happy to assist in the future as needed.   Ralene Bathe, PharmD, [place] [phone]

## 2019-04-13 NOTE — Telephone Encounter (Signed)
Info was faxed on 04-12-2019. Denied PA on 04-13-2019

## 2019-04-13 NOTE — Telephone Encounter (Signed)
PA for [employer] faxed additional info on 04-12-2019. PA denied on 04-13-2019

## 2019-04-13 NOTE — Progress Notes (Signed)
Subjective:  Patient ID: Jason Cotta Sr., male    DOB: [birth year]  Age: 73 y.o. MRN: [id number]  CC: Hypertension and Congestive Heart Failure   HPI Jason LEVIT Sr. presents for f/up - He was recently seen in the ED with concerns that he was not producing enough urine.  An ultrasound of the bladder showed that there was no urinary retention.  His decreased urine output has been attributed to end-stage renal disease.  He has felt well since he was seen in the ED.  He is producing some urine and denies abdominal pain, dysuria, hematuria, nausea, vomiting, fever, chills, dizziness, or lightheadedness.  Outpatient Medications Prior to Visit  Medication Sig Dispense Refill  . acetaminophen (TYLENOL) 325 MG tablet Take by mouth.    Marland Kitchen allopurinol (ZYLOPRIM) 100 MG tablet Take 1 tablet by mouth once daily (Patient taking differently: Take 100 mg by mouth daily. ) 90 tablet 1  . atorvastatin (LIPITOR) 20 MG tablet TAKE 1 TABLET BY MOUTH  DAILY AT 6 PM. (Patient taking differently: Take 20 mg by mouth daily at 6 PM. ) 90 tablet 1  . cinacalcet (SENSIPAR) 30 MG tablet Take by mouth.    . Eluxadoline (VIBERZI) 75 MG TABS Take 75 mg by mouth 2 (two) times daily. 60 tablet 3  . ferric citrate (AURYXIA) 1 GM 210 MG(Fe) tablet Take 210 mg by mouth See admin instructions. Take 210 mg with breakfast, 210 mg with dinner, and 210 mg with a snack at bedtime    . lipase/protease/amylase (CREON) 36000 UNITS CPEP capsule Take 1 capsule (36,000 Units total) by mouth 3 (three) times daily with meals. (Patient taking differently: Take 36,000-72,000 Units by mouth See admin instructions. Take 72000 units with breakfast and 36000 units with dinner) 56 capsule 0  . metoprolol succinate (TOPROL-XL) 25 MG 24 hr tablet Take 1 tablet (25 mg total) by mouth daily. 90 tablet 0  . sucroferric oxyhydroxide (VELPHORO) 500 MG chewable tablet Chew by mouth.    . warfarin (COUMADIN) 5 MG tablet TAKE 1 TABLET DAILY OR AS DIRECTED BY  ANTICOAGULATION CLINIC (Patient taking differently: Take 2.5-5 mg by mouth See admin instructions. Take 2.5 mg in the morning on Mon and Thurs.  Take 5 mg in the morning on Sun, Tue, Wed, Fri, and Sat.) 90 tablet 0  . budesonide (ENTOCORT EC) 3 MG 24 hr capsule Take by mouth.    . enoxaparin (LOVENOX) 60 MG/0.6ML injection Inject 0.6 mLs (60 mg total) into the skin daily for 7 doses. 4.2 mL 0  . loperamide (IMODIUM) 2 MG capsule Take by mouth.    . Menthol (ICY HOT) 5 % PTCH Apply 1 patch topically daily as needed (pain).    . Multiple Vitamin (MULTIVITAMIN) capsule Take by mouth.     No facility-administered medications prior to visit.     ROS Review of Systems  Constitutional: Negative for chills, diaphoresis, fatigue and fever.  HENT: Negative.   Eyes: Negative for visual disturbance.  Respiratory: Negative for chest tightness, shortness of breath and wheezing.   Cardiovascular: Negative for chest pain, palpitations and leg swelling.  Gastrointestinal: Positive for diarrhea. Negative for abdominal pain, blood in stool, nausea and vomiting.       His diarrhea is better since he started taking Viberzi.  Endocrine: Negative.   Genitourinary: Negative.  Negative for difficulty urinating.  Musculoskeletal: Negative.   Neurological: Negative.  Negative for dizziness, weakness and light-headedness.  Hematological: Negative for adenopathy. Does not bruise/bleed  easily.  Psychiatric/Behavioral: Negative.     Objective:  BP 110/70 (BP Location: Left Arm, Patient Position: Sitting, Cuff Size: Normal)   Pulse 76   Temp 98 F (36.7 C) (Oral)   Resp 16   Ht 5\' 8"  (1.727 m)   Wt 145 lb (65.8 kg)   SpO2 99%   BMI 22.05 kg/m   BP Readings from Last 3 Encounters:  04/13/19 110/70  04/08/19 (!) 129/38  03/09/19 (!) 123/59    Wt Readings from Last 3 Encounters:  04/13/19 145 lb (65.8 kg)  04/08/19 145 lb 9.6 oz (66 kg)  03/09/19 145 lb 15.1 oz (66.2 kg)    Physical Exam Vitals  signs reviewed.  HENT:     Nose: Nose normal.     Mouth/Throat:     Mouth: Mucous membranes are moist.  Eyes:     General: No scleral icterus.    Conjunctiva/sclera: Conjunctivae normal.  Neck:     Musculoskeletal: Normal range of motion.  Cardiovascular:     Rate and Rhythm: Normal rate and regular rhythm.     Heart sounds: No murmur.  Pulmonary:     Effort: Pulmonary effort is normal.     Breath sounds: No stridor. No wheezing, rhonchi or rales.  Abdominal:     General: Abdomen is flat. Bowel sounds are normal. There is no distension.     Palpations: There is no splenomegaly.  Musculoskeletal: Normal range of motion.     Right lower leg: No edema.     Left lower leg: No edema.  Lymphadenopathy:     Cervical: No cervical adenopathy.  Skin:    General: Skin is warm and dry.  Neurological:     General: No focal deficit present.     Mental Status: He is alert.     Lab Results  Component Value Date   WBC 6.4 04/08/2019   HGB 13.2 04/08/2019   HCT 42.2 04/08/2019   PLT 179 04/08/2019   GLUCOSE 83 04/08/2019   CHOL 151 08/17/2018   TRIG 114 08/17/2018   HDL 66 08/17/2018   LDLDIRECT 120.0 06/04/2015   LDLCALC 62 08/17/2018   ALT 14 02/26/2019   AST 19 02/26/2019   NA 138 04/08/2019   K 3.7 04/08/2019   CL 98 04/08/2019   CREATININE 6.84 (H) 04/08/2019   BUN 27 (H) 04/08/2019   CO2 25 04/08/2019   TSH 1.83 10/19/2018   PSA 1.86 08/10/2013   INR 2.2 04/08/2019   HGBA1C 5.5 06/09/2018    Dg Chest 2 View  Result Date: 04/08/2019 CLINICAL DATA:  Dialysis patient has not urinated for the past 2-3 weeks. Ex-smoker. History of a lung nodule. EXAM: CHEST - 2 VIEW COMPARISON:  06/10/2018. Chest CT dated 08/11/2018. FINDINGS: Normal sized heart. Clear lungs. Stable left upper lobe staple line with associated soft tissue density scarring. Left subclavian stent. Thoracic spine degenerative changes and mild scoliosis. Old, healed left rib fracture. IMPRESSION: No acute  abnormality. Stable left upper lobe scarring. Electronically Signed   By: Claudie Revering M.D.   On: 04/08/2019 19:17    Assessment & Plan:   Jason [name] was seen today for hypertension and congestive heart failure.  Diagnoses and all orders for this visit:  Benign essential HTN- His blood pressure is adequately well controlled.  Systolic congestive heart failure, unspecified HF chronicity ([place])- He has a normal volume status.   I have discontinued Jason Cotta Sr.'s enoxaparin, Icy Hot, budesonide, loperamide, and multivitamin. I am also  having him maintain his ferric citrate, metoprolol succinate, allopurinol, lipase/protease/amylase, warfarin, atorvastatin, Viberzi, acetaminophen, cinacalcet, and Velphoro.  No orders of the defined types were placed in this encounter.    Follow-up: No follow-ups on file.  [name], MD

## 2019-04-13 NOTE — Telephone Encounter (Signed)
Mr Dolley aware that Rx was denied. He will come to the office to pick up additional samples and pick up paperwork to start the patient assistance process.   Samples- Viberzi 75mg  # 7 boxes total of 56 tablets for 28 days supply.

## 2019-04-14 DIAGNOSIS — N186 End stage renal disease: Secondary | ICD-10-CM | POA: Diagnosis not present

## 2019-04-14 DIAGNOSIS — N2581 Secondary hyperparathyroidism of renal origin: Secondary | ICD-10-CM | POA: Diagnosis not present

## 2019-04-14 DIAGNOSIS — D689 Coagulation defect, unspecified: Secondary | ICD-10-CM | POA: Diagnosis not present

## 2019-04-14 DIAGNOSIS — Z992 Dependence on renal dialysis: Secondary | ICD-10-CM | POA: Diagnosis not present

## 2019-04-14 DIAGNOSIS — R197 Diarrhea, unspecified: Secondary | ICD-10-CM | POA: Diagnosis not present

## 2019-04-14 DIAGNOSIS — Z23 Encounter for immunization: Secondary | ICD-10-CM | POA: Diagnosis not present

## 2019-04-14 DIAGNOSIS — E876 Hypokalemia: Secondary | ICD-10-CM | POA: Diagnosis not present

## 2019-04-15 ENCOUNTER — Telehealth: Payer: Self-pay | Admitting: *Deleted

## 2019-04-15 ENCOUNTER — Ambulatory Visit (INDEPENDENT_AMBULATORY_CARE_PROVIDER_SITE_OTHER): Payer: Medicare Other | Admitting: *Deleted

## 2019-04-15 VITALS — BP 126/70 | HR 71 | Resp 17 | Ht 68.0 in | Wt 146.0 lb

## 2019-04-15 DIAGNOSIS — Z0000 Encounter for general adult medical examination without abnormal findings: Secondary | ICD-10-CM

## 2019-04-15 NOTE — Telephone Encounter (Signed)
During AWV, patient requested to have a referral to PT for balance, strength and improving ambulatory status. Patient's goal is to become as strong as possible before kidney transplant surgery, last PCP visit 04/13/19.

## 2019-04-15 NOTE — Patient Instructions (Signed)
Heart Failure, Diagnosis  Heart failure is a condition in which the heart has trouble pumping blood because it has become weak or stiff. This means that the heart does not pump blood well enough for the body to stay healthy. For some people with heart failure, fluid may back up into the lungs. There may also be swelling (edema) in the lower legs. Heart failure is usually a long-term (chronic) condition. It is important for you to take good care of yourself and follow the treatment plan from your health care provider. What are the causes? This condition may be caused by:  High blood pressure (hypertension). Hypertension causes the heart muscle to work harder than normal. This makes the heart stiff or weak.  Coronary artery disease, or CAD. CAD is the buildup of cholesterol and fat (plaque) in the arteries of the heart.  Heart attack, also called myocardial infarction. This injures the heart muscle, making it hard for the heart to pump blood.  Abnormal heart valves. The valves do not open and close properly, forcing the heart to pump harder to keep the blood flowing.  Heart muscle disease (cardiomyopathy or myocarditis). This is damage to the heart muscle. It can increase the risk of heart failure.  Lung disease. The heart works harder when the lungs are not healthy.  Abnormal heart rhythms. These can lead to heart failure. What increases the risk? The risk of heart failure increases as a person ages. This condition is also more likely to develop in people who:  Are overweight.  Are male.  Smoke or chew tobacco.  Abuse alcohol or illegal drugs.  Have taken medicines that can damage the heart, such as chemotherapy drugs.  Have diabetes.  Have abnormal heart rhythms.  Have thyroid problems.  Have low blood counts (anemia). What are the signs or symptoms? Symptoms of this condition include:  Shortness of breath with activity, such as when climbing stairs.  A cough that does not  go away.  Swelling of the feet, ankles, legs, or abdomen.  Losing weight for no reason.  Trouble breathing when lying flat (orthopnea).  Waking from sleep because of the need to sit up and get more air.  Rapid heartbeat.  Tiredness (fatigue) and loss of energy.  Feeling light-headed, dizzy, or close to fainting.  Loss of appetite.  Nausea.  Waking up more often during the night to urinate (nocturia).  Confusion. How is this diagnosed? This condition is diagnosed based on:  Your medical history, symptoms, and a physical exam.  Diagnostic tests, which may include: ? Echocardiogram. ? Electrocardiogram (ECG). ? Chest X-ray. ? Blood tests. ? Exercise stress test. ? Radionuclide scans. ? Cardiac catheterization and angiogram. How is this treated? Treatment for this condition is aimed at managing the symptoms of heart failure. Medicines Treatment may include medicines that:  Help lower blood pressure by relaxing (dilating) the blood vessels. These medicines are called ACE inhibitors (angiotensin-converting enzyme) and ARBs (angiotensin receptor blockers).  Cause the kidneys to remove salt and water from the blood through urination (diuretics).  Improve heart muscle strength and prevent the heart from beating too fast (beta blockers).  Increase the force of the heartbeat (digoxin). Healthy behavior changes     Treatment may also include making healthy lifestyle changes, such as:  Reaching and staying at a healthy weight.  Quitting smoking or chewing tobacco.  Eating heart-healthy foods.  Limiting or avoiding alcohol.  Stopping the use of illegal drugs.  Being physically active.  Other treatments  Other treatments may include:  Procedures to open blocked arteries or repair damaged valves.  Placing a pacemaker to improve heart function (cardiac resynchronization therapy).  Placing a device to treat serious abnormal heart rhythms (implantable cardioverter  defibrillator, or ICD).  Placing a device to improve the pumping ability of the heart (left ventricular assist device, or LVAD).  Receiving a healthy heart from a donor (heart transplant). This is done when other treatments have not helped. Follow these instructions at home:  Manage other health conditions as told by your health care provider. These may include hypertension, diabetes, thyroid disease, or abnormal heart rhythms.  Get ongoing education and support as needed. Learn as much as you can about heart failure.  Keep all follow-up visits as told by your health care provider. This is important. Summary  Heart failure is a condition in which the heart has trouble pumping blood because it has become weak or stiff.  This condition is caused by high blood pressure and other diseases of the heart and lungs.  Symptoms of this condition include shortness of breath, tiredness (fatigue), nausea, and swelling of the feet, ankles, legs, or abdomen.  Treatments for this condition may include medicines, lifestyle changes, and surgery.  Manage other health conditions as told by your health care provider. This information is not intended to replace advice given to you by your health care provider. Make sure you discuss any questions you have with your health care provider. Document Released: 06/23/2005 Document Revised: 09/10/2018 Document Reviewed: 09/10/2018 Elsevier Patient Education  [place].

## 2019-04-15 NOTE — Progress Notes (Addendum)
Subjective:   Jason DORSEY Sr. is a 73 y.o. male who presents for Medicare Annual/Subsequent preventive examination.  Review of Systems:  Cardiac Risk Factors include: advanced age (>40men, >40 women);male gender Sleep patterns: feels rested on waking and sleeps 8 hours nightly.    Home Safety/Smoke Alarms: Feels safe in home. Smoke alarms in place.  Living environment; residence and Firearm Safety: 1-story house/ trailer. Lives with wife, no needs for DME, good support system Seat Belt Safety/Bike Helmet: Wears seat belt.      Objective:    Vitals: BP 126/70   Pulse 71   Resp 17   Ht 5\' 8"  (1.727 m)   Wt 146 lb (66.2 kg)   SpO2 100%   BMI 22.20 kg/m   Body mass index is 22.2 kg/m.  Advanced Directives 04/15/2019 04/08/2019 03/09/2019 03/08/2019 03/04/2019 02/26/2019 02/10/2019  Does Patient Have a Medical Advance Directive? Yes Yes Yes Yes Yes No No  Type of Paramedic of [place];Living will Living will [place];Living will [place];Living will [place];Living will - -  Does patient want to make changes to medical advance directive? - - - - No - Patient declined - -  Copy of [place] in Chart? No - copy requested - No - copy requested No - copy requested No - copy requested - -  Would patient like information on creating a medical advance directive? - - - - No - Patient declined No - Patient declined No - Patient declined  Pre-existing out of facility DNR order (yellow form or pink MOST form) - - - - - - -    Tobacco Social History   Tobacco Use  Smoking Status Former Smoker  . Packs/day: 0.50  . Years: 10.00  . Pack years: 5.00  . Types: Cigarettes  . Quit date: 07/07/1977  . Years since quitting: 41.8  Smokeless Tobacco Never Used     Counseling given: Not Answered  Past Medical History:  Diagnosis Date  . Anemia   . Arthritis   . Chronic renal insufficiency  07/31/2011  . COPD (chronic obstructive pulmonary disease) ([place])    pt denies  . CVA (cerebral vascular accident) ([place]) 06/2018  . Deficiency anemia 07/20/2012  . Diverticulosis   . Edema 09/21/2010   Qualifier: Diagnosis of  By: Jerold Coombe    . ERECTILE DYSFUNCTION, MILD 05/11/2007   Qualifier: Diagnosis of  By: Sherren Mocha MD, Jory Ee   . Exertional dyspnea 10/30/2015  . GOUT 05/11/2007   Qualifier: Diagnosis of  By: Sherren Mocha MD, Jory Ee   . Hereditary and idiopathic peripheral neuropathy 09/14/2014  . History of radiation therapy 02/21/13-03/31/13   rectum 50.4Gy total dose  . Hypersomnia 10/30/2015  . Hypertension   . Lung nodule 10/30/2015  . OAB (overactive bladder) 01/09/2016  . Obesity (BMI 30.0-34.9) 10/30/2015  . Obstructive sleep apnea 07/29/2010   HST 11/2015 AHI 54.  On autocpap  >> 10 cm   . Peripheral edema 09/19/2013  . rectal ca dx'd 02/01/13   rectal. Radiation and chemotherapy- remains on chemotherapy-next tx. 08-22-13.   . S/P partial lobectomy of lung 11/20/2015  . S/P thoracotomy   . SUI (stress urinary incontinence), male 01/09/2016   Past Surgical History:  Procedure Laterality Date  . A/V FISTULAGRAM Left 06/14/2018   Procedure: A/V FISTULAGRAM;  Surgeon: Waynetta Sandy, MD;  Location: [facility];  Service: Cardiovascular;  Laterality: Left;  .  AV FISTULA PLACEMENT Left 03/24/2016   Procedure: CREATION OF LEFT BRACIOCEPHALIC ARTERIOVENOUS (AV) FISTULA;  Surgeon: Elam Dutch, MD;  Location: [facility];  Service: Vascular;  Laterality: Left;  . BIOPSY  09/17/2018   Procedure: BIOPSY;  Surgeon: Doran Stabler, MD;  Location: [facility];  Service: Gastroenterology;;  . BIOPSY  03/09/2019   Procedure: BIOPSY;  Surgeon: Doran Stabler, MD;  Location: [facility];  Service: Gastroenterology;;  . COLON SURGERY  05/20/2013  . COLONOSCOPY  2010   [facility]  . COLONOSCOPY WITH PROPOFOL N/A 09/17/2018   Procedure: COLONOSCOPY WITH PROPOFOL;  Surgeon: Doran Stabler, MD;  [facility];  Service: Gastroenterology;  Laterality: N/A;  . ESOPHAGOGASTRODUODENOSCOPY (EGD) WITH PROPOFOL N/A 09/17/2018   Procedure: ESOPHAGOGASTRODUODENOSCOPY (EGD) WITH PROPOFOL;  Surgeon: Doran Stabler, MD;  [facility];  Service: Gastroenterology;  Laterality: N/A;  . ESOPHAGOGASTRODUODENOSCOPY (EGD) WITH PROPOFOL N/A 03/09/2019   Procedure: ESOPHAGOGASTRODUODENOSCOPY (EGD) WITH PROPOFOL;  Surgeon: Doran Stabler, MD;  [facility];  Service: Gastroenterology;  Laterality: N/A;  . EUS N/A 02/03/2013   Procedure: LOWER ENDOSCOPIC ULTRASOUND (EUS);  Surgeon: Milus Banister, MD;  Location: [facility];  Service: Endoscopy;  Laterality: N/A;  . ILEOSTOMY CLOSURE N/A 08/24/2013   Procedure: CLOSURE OF LOOP ILEOSTOMY ;  Surgeon: Leighton Ruff, MD;  [facility];  Service: General;  Laterality: N/A;  . IR ANGIO VERTEBRAL SEL SUBCLAVIAN INNOMINATE BILAT MOD SED  06/08/2018  . IR CT HEAD LTD  06/08/2018  . IR INTRAVSC STENT CERV CAROTID W/O EMB-PROT MOD SED INC ANGIO  06/08/2018  . IR PERCUTANEOUS ART THROMBECTOMY/INFUSION INTRACRANIAL INC DIAG ANGIO  06/08/2018  . LAPAROSCOPIC LOW ANTERIOR RESECTION N/A 05/20/2013   Procedure: LAPAROSCOPIC LOW ANTERIOR RESECTION WITH SPLENIC FLEXURE MOBILIZATION;  Surgeon: Leighton Ruff, MD;  [facility];  Service: General;  Laterality: N/A;  . OSTOMY N/A 05/20/2013   Procedure: diverting OSTOMY;  Surgeon: Leighton Ruff, MD;  [facility];  Service: General;  Laterality: N/A;  . PERIPHERAL VASCULAR INTERVENTION Left 06/14/2018   Procedure: PERIPHERAL VASCULAR INTERVENTION;  Surgeon: Waynetta Sandy, MD;  Location: [facility];  Service: Cardiovascular;  Laterality: Left;  AVF on the Left  . RADIOLOGY WITH ANESTHESIA N/A 06/08/2018   Procedure: IR WITH ANESTHESIA;  Surgeon: Radiologist, Medication, MD;  Location: [facility];  Service: Radiology;  Laterality: N/A;  . VIDEO ASSISTED  THORACOSCOPY (VATS)/WEDGE RESECTION Left 11/20/2015   Procedure: VIDEO ASSISTED THORACOSCOPY (VATS)/LUNG RESECTION;  Surgeon: [name], MD;  Location: [facility];  Service: Thoracic;  Laterality: Left;  Marland Kitchen VIDEO BRONCHOSCOPY N/A 11/20/2015   Procedure: VIDEO BRONCHOSCOPY;  Surgeon: [name], MD;  Location: [facility];  Service: Thoracic;  Laterality: N/A;   Family History  Problem Relation Age of Onset  . Stroke Mother   . Hypertension Mother   . Stroke Father   . Hypertension Father   . Hypertension Other   . Colon cancer Neg Hx    Social History   Socioeconomic History  . Marital status: Married    Spouse name: Jason [name]  . Number of children: 2  . Years of education: 45  . Highest education level: Not on file  Occupational History    Comment: retired Insurance account manager  Social Needs  . Financial resource strain: Not hard at all  . Food insecurity    Worry: Never true    Inability: Never true  . Transportation needs  Medical: No    Non-medical: No  Tobacco Use  . Smoking status: Former Smoker    Packs/day: 0.50    Years: 10.00    Pack years: 5.00    Types: Cigarettes    Quit date: 07/07/1977    Years since quitting: 41.8  . Smokeless tobacco: Never Used  Substance and Sexual Activity  . Alcohol use: Not Currently    Alcohol/week: 0.0 standard drinks    Comment: drinks occasionally  . Drug use: No  . Sexual activity: Not Currently  Lifestyle  . Physical activity    Days per week: 0 days    Minutes per session: 0 min  . Stress: To some extent  Relationships  . Social connections    Talks on phone: More than three times a week    Gets together: More than three times a week    Attends religious service: More than 4 times per year    Active member of club or organization: Yes    Attends meetings of clubs or organizations: More than 4 times per year    Relationship status: Married  Other Topics Concern  . 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 acetaminophen (TYLENOL) 325 MG tablet Take by mouth.  Marland Kitchen allopurinol (ZYLOPRIM) 100 MG tablet Take 1 tablet by mouth once daily (Patient taking differently: Take 100 mg by mouth daily. )  . atorvastatin (LIPITOR) 20 MG tablet TAKE 1 TABLET BY MOUTH  DAILY AT 6 PM. (Patient taking differently: Take 20 mg by mouth daily at 6 PM. )  . cinacalcet (SENSIPAR) 30 MG tablet Take by mouth.  . Eluxadoline (VIBERZI) 75 MG TABS Take 75 mg by mouth 2 (two) times daily.  . ferric citrate (AURYXIA) 1 GM 210 MG(Fe) tablet Take 210 mg by mouth See admin instructions. Take 210 mg with breakfast, 210 mg with dinner, and 210 mg with a snack at bedtime  . lipase/protease/amylase (CREON) 36000 UNITS CPEP capsule Take 1 capsule (36,000 Units total) by mouth 3 (three) times daily with meals. (Patient taking differently: Take 36,000-72,000 Units by mouth See admin instructions. Take 72000 units with breakfast and 36000 units with dinner)  . metoprolol succinate (TOPROL-XL) 25 MG 24 hr tablet Take 1 tablet (25 mg total) by mouth daily.  . sucroferric oxyhydroxide (VELPHORO) 500 MG chewable tablet Chew by mouth.  . warfarin (COUMADIN) 5 MG tablet TAKE 1 TABLET DAILY OR AS DIRECTED BY ANTICOAGULATION CLINIC (Patient taking differently: Take 2.5-5 mg by mouth See admin instructions. Take 2.5 mg in the morning on Mon and Thurs.  Take 5 mg in the morning on Sun, Tue, Wed, Fri, and Sat.)   No facility-administered encounter medications on file as of 04/15/2019.     Activities of Daily Living In your present state of health, do you have any difficulty performing the following activities: 04/15/2019  Hearing? N  Vision? N  Difficulty concentrating or making decisions? N  Walking or climbing stairs? N  Dressing or bathing? N  Doing errands,  shopping? N  Preparing Food and eating ? N  Using the Toilet? N  In the past six months, have you accidently leaked urine? N  Do you have problems with loss of bowel control? N  Managing your Medications? N  Managing your Finances? N  Housekeeping or managing your Housekeeping? N  Some recent data might be hidden    Patient Care Team: [name], MD as PCP - General (Internal Medicine) Lafayette Dragon, MD (Inactive) as Attending Physician (Gastroenterology) Leighton Ruff, MD as Consulting Physician (General Surgery) [name], MD as Consulting Physician (Oncology) Elmarie Shiley, MD as Consulting Physician (Nephrology) Monna Fam, MD as Consulting Physician (Ophthalmology) Josue Hector, MD as Consulting Physician (Cardiology)   Assessment:   This is a routine wellness examination for [place]. Physical assessment deferred to PCP.  Exercise Activities and Dietary recommendations Current Exercise Habits: The patient does not participate in regular exercise at present(chair exercise printouts provided), Exercise limited by: orthopedic condition(s)(weakness from diaylsis)  Diet (meal preparation, eat out, water intake, caffeinated beverages, dairy products, fruits and vegetables): in general, a "healthy" diet   Dialysis patient who maintains strict  fluid restriction and renal diet. Works with dietician through nephrologist.    Goals    . Patient Stated     Continue to exercise and learn about diet for my kidneys. Enjoy life and go fishing as much as possible.    . Patient Stated     Work towards gaining strength by increasing my physical activity and working with PT.        Fall Risk Fall Risk  04/15/2019 11/10/2018 08/17/2018 04/12/2018 04/10/2017  Falls in the past year? 1 0 1 No No  Number falls in past yr: 0 0 0 - -  Injury with Fall? 0 0 1 - -  Risk for fall due to : Impaired balance/gait;Impaired mobility - - - -  Follow up Falls prevention discussed;Education  provided - - - -   Is the patient's home free of loose throw rugs in walkways, pet beds, electrical cords, etc?   yes      Grab bars in the bathroom? no      Handrails on the stairs?   yes      Adequate lighting?   yes  Depression Screen PHQ 2/9 Scores 04/15/2019 04/13/2019 07/13/2018 07/05/2018  PHQ - 2 Score 1 0 0 0  PHQ- 9 Score - - - -    Cognitive Function MMSE - Mini Mental State Exam 04/12/2018 04/10/2017  Orientation to time 5 5  Orientation to Place 5 5  Registration 3 3  Attention/ Calculation 4 4  Recall 1 2  Language- name 2 objects 2 2  Language- repeat 1 1  Language- follow 3 step command 3 3  Language- read & follow direction 1 1  Write a sentence 1 1  Copy design 1 1  Total score 27 28       Ad8 score reviewed for issues:  Issues making decisions: no  Less interest in hobbies / activities: no  Repeats questions, stories (family complaining): no  Trouble using ordinary gadgets (microwave, computer, phone):no  Forgets the month or year: no  Mismanaging finances: no  Remembering appts: no  Daily problems with thinking and/or memory: no Ad8 score is= 0  Immunization History  Administered Date(s) Administered  . Hepatitis B, adult 07/05/2018, 09/02/2018, 09/30/2018, 03/10/2019  . Influenza Whole 05/15/2009, 06/05/2010  . Influenza, High Dose Seasonal PF 04/27/2015, 04/29/2016, 04/10/2017, 04/12/2018, 03/31/2019  . Influenza,inj,Quad PF,6+ Mos 07/14/2013, 04/10/2014  . Pneumococcal Conjugate-13 06/04/2015  . Pneumococcal Polysaccharide-23 07/20/2012, 08/26/2013  . Td 07/07/1997, 04/27/2008  . Tdap 11/19/2016   Screening Tests Health Maintenance  Topic Date Due  . COLONOSCOPY  09/17/2023  . TETANUS/TDAP  11/20/2026  . INFLUENZA VACCINE  Completed  . Hepatitis C Screening  Completed  . PNA vac Low Risk Adult  Completed       Plan:    Reviewed health maintenance screenings with patient today and relevant education, vaccines, and/or referrals  were provided.   I have personally reviewed and noted the following in the patient's chart:   . Medical and social history . Use of alcohol, tobacco or illicit drugs  . Current medications and supplements . Functional ability and status . Nutritional status . Physical activity . Advanced directives . List of other physicians . Screenings to include cognitive, depression, and falls . Referrals and appointments  In addition, I have reviewed and discussed with patient certain preventive protocols, quality metrics, and best practice recommendations. A written personalized care plan for preventive services as well as general preventive health recommendations were provided to patient.     Michiel Cowboy, RN  04/15/2019   Medical screening examination/treatment/procedure(s) were performed by non-physician practitioner and as supervising provider I was immediately available for consultation/collaboration.  I agree with above. Marrian Salvage, FNP

## 2019-04-15 NOTE — Patient Instructions (Addendum)
Continue doing brain stimulating activities (puzzles, reading, adult coloring books, staying active) to keep memory sharp.    Jason [name] , Thank you for taking time to come for your Medicare Wellness Visit. I appreciate your ongoing commitment to your health goals. Please review the following plan we discussed and let me know if I can assist you in the future.   These are the goals we discussed: Goals    . Patient Stated     Continue to exercise and learn about diet for my kidneys. Enjoy life and go fishing as much as possible.    . Patient Stated     Work towards gaining strength by increasing my physical activity and working with PT.        This is a list of the screening recommended for you and due dates:  Health Maintenance  Topic Date Due  . Colon Cancer Screening  09/17/2023  . Tetanus Vaccine  11/20/2026  . Flu Shot  Completed  .  Hepatitis C: One time screening is recommended by Center for Disease Control  (CDC) for  adults born from [birth year] through 1964.   Completed  . Pneumonia vaccines  Completed    Preventive Care 32 Years and Older, Male Preventive care refers to lifestyle choices and visits with your health care provider that can promote health and wellness. This includes:  A yearly physical exam. This is also called an annual well check.  Regular dental and eye exams.  Immunizations.  Screening for certain conditions.  Healthy lifestyle choices, such as diet and exercise. What can I expect for my preventive care visit? Physical exam Your health care provider will check:  Height and weight. These may be used to calculate body mass index (BMI), which is a measurement that tells if you are at a healthy weight.  Heart rate and blood pressure.  Your skin for abnormal spots. Counseling Your health care provider may ask you questions about:  Alcohol, tobacco, and drug use.  Emotional well-being.  Home and relationship well-being.  Sexual  activity.  Eating habits.  History of falls.  Memory and ability to understand (cognition).  Work and work Statistician. What immunizations do I need?  Influenza (flu) vaccine  This is recommended every year. Tetanus, diphtheria, and pertussis (Tdap) vaccine  You may need a Td booster every 10 years. Varicella (chickenpox) vaccine  You may need this vaccine if you have not already been vaccinated. Zoster (shingles) vaccine  You may need this after age 19. Pneumococcal conjugate (PCV13) vaccine  One dose is recommended after age 58. Pneumococcal polysaccharide (PPSV23) vaccine  One dose is recommended after age 10. Measles, mumps, and rubella (MMR) vaccine  You may need at least one dose of MMR if you were born in 1957 or later. You may also need a second dose. Meningococcal conjugate (MenACWY) vaccine  You may need this if you have certain conditions. Hepatitis A vaccine  You may need this if you have certain conditions or if you travel or work in places where you may be exposed to hepatitis A. Hepatitis B vaccine  You may need this if you have certain conditions or if you travel or work in places where you may be exposed to hepatitis B. Haemophilus influenzae type b (Hib) vaccine  You may need this if you have certain conditions. You may receive vaccines as individual doses or as more than one vaccine together in one shot (combination vaccines). Talk with your health care provider about the  risks and benefits of combination vaccines. What tests do I need? Blood tests  Lipid and cholesterol levels. These may be checked every 5 years, or more frequently depending on your overall health.  Hepatitis C test.  Hepatitis B test. Screening  Lung cancer screening. You may have this screening every year starting at age 44 if you have a 30-pack-year history of smoking and currently smoke or have quit within the past 15 years.  Colorectal cancer screening. All adults  should have this screening starting at age 34 and continuing until age 40. Your health care provider may recommend screening at age 10 if you are at increased risk. You will have tests every 1-10 years, depending on your results and the type of screening test.  Prostate cancer screening. Recommendations will vary depending on your family history and other risks.  Diabetes screening. This is done by checking your blood sugar (glucose) after you have not eaten for a while (fasting). You may have this done every 1-3 years.  Abdominal aortic aneurysm (AAA) screening. You may need this if you are a current or former smoker.  Sexually transmitted disease (STD) testing. Follow these instructions at home: Eating and drinking  Eat a diet that includes fresh fruits and vegetables, whole grains, lean protein, and low-fat dairy products. Limit your intake of foods with high amounts of sugar, saturated fats, and salt.  Take vitamin and mineral supplements as recommended by your health care provider.  Do not drink alcohol if your health care provider tells you not to drink.  If you drink alcohol: ? Limit how much you have to 0-2 drinks a day. ? Be aware of how much alcohol is in your drink. In the [place] one drink equals one 12 oz bottle of beer (355 mL), one 5 oz glass of wine (148 mL), or one 1 oz glass of hard liquor (44 mL). Lifestyle  Take daily care of your teeth and gums.  Stay active. Exercise for at least 30 minutes on 5 or more days each week.  Do not use any products that contain nicotine or tobacco, such as cigarettes, e-cigarettes, and chewing tobacco. If you need help quitting, ask your health care provider.  If you are sexually active, practice safe sex. Use a condom or other form of protection to prevent STIs (sexually transmitted infections).  Talk with your health care provider about taking a low-dose aspirin or statin. What's next?  Visit your health care provider once a year  for a well check visit.  Ask your health care provider how often you should have your eyes and teeth checked.  Stay up to date on all vaccines. This information is not intended to replace advice given to you by your health care provider. Make sure you discuss any questions you have with your health care provider. Document Released: 07/20/2015 Document Revised: 06/17/2018 Document Reviewed: 06/17/2018 Elsevier Patient Education  2020 [employer].

## 2019-04-16 DIAGNOSIS — D689 Coagulation defect, unspecified: Secondary | ICD-10-CM | POA: Diagnosis not present

## 2019-04-16 DIAGNOSIS — Z992 Dependence on renal dialysis: Secondary | ICD-10-CM | POA: Diagnosis not present

## 2019-04-16 DIAGNOSIS — R197 Diarrhea, unspecified: Secondary | ICD-10-CM | POA: Diagnosis not present

## 2019-04-16 DIAGNOSIS — E876 Hypokalemia: Secondary | ICD-10-CM | POA: Diagnosis not present

## 2019-04-16 DIAGNOSIS — N2581 Secondary hyperparathyroidism of renal origin: Secondary | ICD-10-CM | POA: Diagnosis not present

## 2019-04-16 DIAGNOSIS — Z23 Encounter for immunization: Secondary | ICD-10-CM | POA: Diagnosis not present

## 2019-04-16 DIAGNOSIS — N186 End stage renal disease: Secondary | ICD-10-CM | POA: Diagnosis not present

## 2019-04-17 ENCOUNTER — Other Ambulatory Visit: Payer: Self-pay | Admitting: Internal Medicine

## 2019-04-17 DIAGNOSIS — I69398 Other sequelae of cerebral infarction: Secondary | ICD-10-CM

## 2019-04-17 DIAGNOSIS — R269 Unspecified abnormalities of gait and mobility: Secondary | ICD-10-CM | POA: Insufficient documentation

## 2019-04-19 DIAGNOSIS — Z992 Dependence on renal dialysis: Secondary | ICD-10-CM | POA: Diagnosis not present

## 2019-04-19 DIAGNOSIS — D689 Coagulation defect, unspecified: Secondary | ICD-10-CM | POA: Diagnosis not present

## 2019-04-19 DIAGNOSIS — N186 End stage renal disease: Secondary | ICD-10-CM | POA: Diagnosis not present

## 2019-04-19 DIAGNOSIS — N2581 Secondary hyperparathyroidism of renal origin: Secondary | ICD-10-CM | POA: Diagnosis not present

## 2019-04-19 DIAGNOSIS — R197 Diarrhea, unspecified: Secondary | ICD-10-CM | POA: Diagnosis not present

## 2019-04-19 DIAGNOSIS — Z23 Encounter for immunization: Secondary | ICD-10-CM | POA: Diagnosis not present

## 2019-04-19 DIAGNOSIS — E876 Hypokalemia: Secondary | ICD-10-CM | POA: Diagnosis not present

## 2019-04-20 ENCOUNTER — Encounter: Payer: Self-pay | Admitting: Internal Medicine

## 2019-04-20 ENCOUNTER — Other Ambulatory Visit: Payer: Self-pay

## 2019-04-20 ENCOUNTER — Ambulatory Visit: Payer: Medicare Other | Admitting: Internal Medicine

## 2019-04-20 VITALS — BP 110/64 | HR 73 | Ht 68.0 in | Wt 148.0 lb

## 2019-04-20 DIAGNOSIS — I63413 Cerebral infarction due to embolism of bilateral middle cerebral arteries: Secondary | ICD-10-CM | POA: Diagnosis not present

## 2019-04-20 DIAGNOSIS — I10 Essential (primary) hypertension: Secondary | ICD-10-CM | POA: Diagnosis not present

## 2019-04-20 DIAGNOSIS — I5022 Chronic systolic (congestive) heart failure: Secondary | ICD-10-CM

## 2019-04-20 NOTE — Progress Notes (Signed)
Patient Care Team: [name], MD as PCP - General (Internal Medicine) Lafayette Dragon, MD (Inactive) as Attending Physician (Gastroenterology) Leighton Ruff, MD as Consulting Physician (General Surgery) [name], MD as Consulting Physician (Oncology) Elmarie Shiley, MD as Consulting Physician (Nephrology) Monna Fam, MD as Consulting Physician (Ophthalmology) Josue Hector, MD as Consulting Physician (Cardiology)   HPI  Jason Cotta Sr. is a 73 y.o. male seen in follow-up for anticoagulation begun in the wake of a stroke.  Seen previously for consideration of an ICD for primary prevention for nonischemic CM EF 20 %  Currently, he has developed end-stage renal disease on dialysis.  He has a dilated cardiomyopathy and had a stroke in 12/19.  Thought to be most likely "large vessel atherosclerosis ".  He was given a 30-day event recorder in 3/20 which was reported to "finding of atrial fibrillation and therefore Eliquis initiated with discontinuing aspirin and Brilinta "  intercurrently he has been switched to warfarin 2/2 cost   He had a monitor done in March.  I reviewed the strip from day 12.  In my note declares "will initiate NOAC for rate control "as I reviewed the strips however, I see only regular RR intervals and this at a rate of about 150.  It was presumed to be atrial flutter.  Another recording was also reviewed with a heart rate of 152.  There was no change with interpolated PVC.   Has a history of metastatic colon cancer.  2017 metastases were noted on the PET scan.  Underwent resection.  No clear malignancy at follow-up.  The patient denies chest pain, shortness of breath, nocturnal dyspnea, orthopnea or peripheral edema.  There have been no palpitations, lightheadedness or syncope.    Records and Results Reviewed  Past Medical History:  Diagnosis Date  . Anemia   . Arthritis   . Chronic renal insufficiency 07/31/2011  . COPD (chronic obstructive  pulmonary disease) ([place])    pt denies  . CVA (cerebral vascular accident) ([place]) 06/2018  . Deficiency anemia 07/20/2012  . Diverticulosis   . Edema 09/21/2010   Qualifier: Diagnosis of  By: Jerold Coombe    . ERECTILE DYSFUNCTION, MILD 05/11/2007   Qualifier: Diagnosis of  By: Sherren Mocha MD, Jory Ee   . Exertional dyspnea 10/30/2015  . GOUT 05/11/2007   Qualifier: Diagnosis of  By: Sherren Mocha MD, Jory Ee   . Hereditary and idiopathic peripheral neuropathy 09/14/2014  . History of radiation therapy 02/21/13-03/31/13   rectum 50.4Gy total dose  . Hypersomnia 10/30/2015  . Hypertension   . Lung nodule 10/30/2015  . OAB (overactive bladder) 01/09/2016  . Obesity (BMI 30.0-34.9) 10/30/2015  . Obstructive sleep apnea 07/29/2010   HST 11/2015 AHI 54.  On autocpap  >> 10 cm   . Peripheral edema 09/19/2013  . rectal ca dx'd 02/01/13   rectal. Radiation and chemotherapy- remains on chemotherapy-next tx. 08-22-13.   . S/P partial lobectomy of lung 11/20/2015  . S/P thoracotomy   . SUI (stress urinary incontinence), male 01/09/2016    Past Surgical History:  Procedure Laterality Date  . A/V FISTULAGRAM Left 06/14/2018   Procedure: A/V FISTULAGRAM;  Surgeon: Waynetta Sandy, MD;  Location: [facility];  Service: Cardiovascular;  Laterality: Left;  . AV FISTULA PLACEMENT Left 03/24/2016   Procedure: CREATION OF LEFT BRACIOCEPHALIC ARTERIOVENOUS (AV) FISTULA;  Surgeon: Elam Dutch, MD;  Location: [facility];  Service: Vascular;  Laterality: Left;  .  BIOPSY  09/17/2018   Procedure: BIOPSY;  Surgeon: Doran Stabler, MD;  Location: [facility];  Service: Gastroenterology;;  . BIOPSY  03/09/2019   Procedure: BIOPSY;  Surgeon: Doran Stabler, MD;  Location: [facility];  Service: Gastroenterology;;  . COLON SURGERY  05/20/2013  . COLONOSCOPY  2010   [facility]  . COLONOSCOPY WITH PROPOFOL N/A 09/17/2018   Procedure: COLONOSCOPY WITH PROPOFOL;  Surgeon: Doran Stabler, MD;  Location: [facility];  Service: Gastroenterology;  Laterality: N/A;  . ESOPHAGOGASTRODUODENOSCOPY (EGD) WITH PROPOFOL N/A 09/17/2018   Procedure: ESOPHAGOGASTRODUODENOSCOPY (EGD) WITH PROPOFOL;  Surgeon: Doran Stabler, MD;  [facility]: [facility];  Service: Gastroenterology;  Laterality: N/A;  . ESOPHAGOGASTRODUODENOSCOPY (EGD) WITH PROPOFOL N/A 03/09/2019   Procedure: ESOPHAGOGASTRODUODENOSCOPY (EGD) WITH PROPOFOL;  Surgeon: Doran Stabler, MD;  [facility];  Service: Gastroenterology;  Laterality: N/A;  . EUS N/A 02/03/2013   Procedure: LOWER ENDOSCOPIC ULTRASOUND (EUS);  Surgeon: Milus Banister, MD;  Location: [facility];  Service: Endoscopy;  Laterality: N/A;  . ILEOSTOMY CLOSURE N/A 08/24/2013   Procedure: CLOSURE OF LOOP ILEOSTOMY ;  Surgeon: Leighton Ruff, MD;  Location: [facility];  Service: General;  Laterality: N/A;  . IR ANGIO VERTEBRAL SEL SUBCLAVIAN INNOMINATE BILAT MOD SED  06/08/2018  . IR CT HEAD LTD  06/08/2018  . IR INTRAVSC STENT CERV CAROTID W/O EMB-PROT MOD SED INC ANGIO  06/08/2018  . IR PERCUTANEOUS ART THROMBECTOMY/INFUSION INTRACRANIAL INC DIAG ANGIO  06/08/2018  . LAPAROSCOPIC LOW ANTERIOR RESECTION N/A 05/20/2013   Procedure: LAPAROSCOPIC LOW ANTERIOR RESECTION WITH SPLENIC FLEXURE MOBILIZATION;  Surgeon: Leighton Ruff, MD;  Location: [facility];  Service: General;  Laterality: N/A;  . OSTOMY N/A 05/20/2013   Procedure: diverting OSTOMY;  Surgeon: Leighton Ruff, MD;  Location: [facility];  Service: General;  Laterality: N/A;  . PERIPHERAL VASCULAR INTERVENTION Left 06/14/2018   Procedure: PERIPHERAL VASCULAR INTERVENTION;  Surgeon: Waynetta Sandy, MD;  Location: [facility];  Service: Cardiovascular;  Laterality: Left;  AVF on the Left  . RADIOLOGY WITH ANESTHESIA N/A 06/08/2018   Procedure: IR WITH ANESTHESIA;  Surgeon: Radiologist, Medication, MD;  Location: [facility];  Service: Radiology;  Laterality: N/A;  . VIDEO ASSISTED THORACOSCOPY (VATS)/WEDGE RESECTION  Left 11/20/2015   Procedure: VIDEO ASSISTED THORACOSCOPY (VATS)/LUNG RESECTION;  Surgeon: [name], MD;  Location: [facility];  Service: Thoracic;  Laterality: Left;  Marland Kitchen VIDEO BRONCHOSCOPY N/A 11/20/2015   Procedure: VIDEO BRONCHOSCOPY;  Surgeon: [name], MD;  Location: [facility];  Service: Thoracic;  Laterality: N/A;    Current Meds  Medication Sig  . acetaminophen (TYLENOL) 325 MG tablet Take 325 mg by mouth every 4 (four) hours as needed for mild pain.   Marland Kitchen allopurinol (ZYLOPRIM) 100 MG tablet Take 1 tablet by mouth once daily  . atorvastatin (LIPITOR) 20 MG tablet TAKE 1 TABLET BY MOUTH  DAILY AT 6 PM.  . cinacalcet (SENSIPAR) 30 MG tablet Take 30 mg by mouth daily.   . Eluxadoline (VIBERZI) 75 MG TABS Take 75 mg by mouth 2 (two) times daily.  . ferric citrate (AURYXIA) 1 GM 210 MG(Fe) tablet Take 210 mg by mouth See admin instructions. Take 210 mg with breakfast, 210 mg with dinner, and 210 mg with a snack at bedtime  . lipase/protease/amylase (CREON) 36000 UNITS CPEP capsule Take 1 capsule (36,000 Units total) by mouth 3 (three) times daily with meals.  . metoprolol succinate (TOPROL-XL) 25 MG 24 hr  tablet Take 1 tablet (25 mg total) by mouth daily.  . sucroferric oxyhydroxide (VELPHORO) 500 MG chewable tablet Chew 500 mg by mouth 3 (three) times daily with meals.   . warfarin (COUMADIN) 5 MG tablet TAKE 1 TABLET DAILY OR AS DIRECTED BY ANTICOAGULATION CLINIC (Patient taking differently: Take 2.5-5 mg by mouth See admin instructions. Take 2.5 mg in the morning on Mon and Thurs.  Take 5 mg in the morning on Sun, Tue, Wed, Fri, and Sat.)    Allergies  Allergen Reactions  . Nsaids Other (See Comments)    Asked by surgeon to add this medication class as intolerance due to patients renal insufficiency.  . Lactase Other (See Comments)  . Lactose Intolerance (Gi) Diarrhea  . Phentermine Other (See Comments)  . Amlodipine Other (See Comments)    Edema       Review of Systems  negative except from HPI and PMH  Physical Exam BP 110/64   Pulse 73   Ht 5\' 8"  (1.727 m)   Wt 148 lb (67.1 kg)   SpO2 90%   BMI 22.50 kg/m  Well developed and well nourished in no acute distress HENT normal E scleral and icterus clear Neck Supple JVP flat; carotids brisk and full Clear to ausculation  regular rate and rhythm, 2-6 m Soft with active bowel sounds No clubbing cyanosis  Edema Alert and oriented, grossly normal motor and sensory function Skin Warm and Dry  ECG @ 73 19/08.42 Nonspecific ST changes.  ECGs were also reviewed from 3/20.  They demonstrated sinus rhythm.  Event recorder from 3/20 was also reviewed personally.  Demonstrates a narrow QRS tachycardia at a rate of 150 bpm.  Onset and offset are not demonstrable.  Assessment and  Plan  Cardiomyopathy, presumed nonischemic  Chronic Kidney disease ESRD on RRT  Adenocarcinoma colo rectal 2014 with pulm metastasis 11/17 and new nodule 2018 ? Benign   Congestive Heart Failure  Class 2-3  PVCs frequent ' Atrial flutter-presumed  Intracerebral vascular disease status post ICA stenting  Chemotherapy with 5 FU and Capecitabine    Patient currently anticoagulated with warfarin.  I have reached out to Dr. Erlinda Hong concerning the need for adjunctive antiplatelet therapy given his right ICA stenting-- his thought was yes but suggested I ask Dr Leonie Man to whom I have reached out but have not yet heard   I will reach out to Dr. Virgina Jock, he will follow the patient going forward.   Current medicines are reviewed at length with the patient today .  The patient does not  have concerns regarding medicines. We spent more than 50% of our >25 min visit in face to face counseling regarding the above

## 2019-04-20 NOTE — Patient Instructions (Addendum)
Medication Instructions:  Your physician recommends that you continue on your current medications as directed. Please refer to the Current Medication list given to you today.  Labwork: None ordered.  Testing/Procedures: None ordered.  Follow-Up: Your physician wants you to follow-up in: as needed with Dr. Caryl Comes.       Any Other Special Instructions Will Be Listed Below (If Applicable).  If you need a refill on your cardiac medications before your next appointment, please call your pharmacy.

## 2019-04-21 DIAGNOSIS — D689 Coagulation defect, unspecified: Secondary | ICD-10-CM | POA: Diagnosis not present

## 2019-04-21 DIAGNOSIS — E876 Hypokalemia: Secondary | ICD-10-CM | POA: Diagnosis not present

## 2019-04-21 DIAGNOSIS — Z23 Encounter for immunization: Secondary | ICD-10-CM | POA: Diagnosis not present

## 2019-04-21 DIAGNOSIS — Z992 Dependence on renal dialysis: Secondary | ICD-10-CM | POA: Diagnosis not present

## 2019-04-21 DIAGNOSIS — R197 Diarrhea, unspecified: Secondary | ICD-10-CM | POA: Diagnosis not present

## 2019-04-21 DIAGNOSIS — N186 End stage renal disease: Secondary | ICD-10-CM | POA: Diagnosis not present

## 2019-04-21 DIAGNOSIS — N2581 Secondary hyperparathyroidism of renal origin: Secondary | ICD-10-CM | POA: Diagnosis not present

## 2019-04-23 DIAGNOSIS — N2581 Secondary hyperparathyroidism of renal origin: Secondary | ICD-10-CM | POA: Diagnosis not present

## 2019-04-23 DIAGNOSIS — D689 Coagulation defect, unspecified: Secondary | ICD-10-CM | POA: Diagnosis not present

## 2019-04-23 DIAGNOSIS — E876 Hypokalemia: Secondary | ICD-10-CM | POA: Diagnosis not present

## 2019-04-23 DIAGNOSIS — R197 Diarrhea, unspecified: Secondary | ICD-10-CM | POA: Diagnosis not present

## 2019-04-23 DIAGNOSIS — Z23 Encounter for immunization: Secondary | ICD-10-CM | POA: Diagnosis not present

## 2019-04-23 DIAGNOSIS — Z992 Dependence on renal dialysis: Secondary | ICD-10-CM | POA: Diagnosis not present

## 2019-04-23 DIAGNOSIS — N186 End stage renal disease: Secondary | ICD-10-CM | POA: Diagnosis not present

## 2019-04-26 DIAGNOSIS — R197 Diarrhea, unspecified: Secondary | ICD-10-CM | POA: Diagnosis not present

## 2019-04-26 DIAGNOSIS — Z23 Encounter for immunization: Secondary | ICD-10-CM | POA: Diagnosis not present

## 2019-04-26 DIAGNOSIS — E876 Hypokalemia: Secondary | ICD-10-CM | POA: Diagnosis not present

## 2019-04-26 DIAGNOSIS — N186 End stage renal disease: Secondary | ICD-10-CM | POA: Diagnosis not present

## 2019-04-26 DIAGNOSIS — D689 Coagulation defect, unspecified: Secondary | ICD-10-CM | POA: Diagnosis not present

## 2019-04-26 DIAGNOSIS — N2581 Secondary hyperparathyroidism of renal origin: Secondary | ICD-10-CM | POA: Diagnosis not present

## 2019-04-26 DIAGNOSIS — Z992 Dependence on renal dialysis: Secondary | ICD-10-CM | POA: Diagnosis not present

## 2019-04-28 DIAGNOSIS — Z992 Dependence on renal dialysis: Secondary | ICD-10-CM | POA: Diagnosis not present

## 2019-04-28 DIAGNOSIS — E876 Hypokalemia: Secondary | ICD-10-CM | POA: Diagnosis not present

## 2019-04-28 DIAGNOSIS — R197 Diarrhea, unspecified: Secondary | ICD-10-CM | POA: Diagnosis not present

## 2019-04-28 DIAGNOSIS — N2581 Secondary hyperparathyroidism of renal origin: Secondary | ICD-10-CM | POA: Diagnosis not present

## 2019-04-28 DIAGNOSIS — Z23 Encounter for immunization: Secondary | ICD-10-CM | POA: Diagnosis not present

## 2019-04-28 DIAGNOSIS — D689 Coagulation defect, unspecified: Secondary | ICD-10-CM | POA: Diagnosis not present

## 2019-04-28 DIAGNOSIS — N186 End stage renal disease: Secondary | ICD-10-CM | POA: Diagnosis not present

## 2019-04-30 DIAGNOSIS — N186 End stage renal disease: Secondary | ICD-10-CM | POA: Diagnosis not present

## 2019-04-30 DIAGNOSIS — Z23 Encounter for immunization: Secondary | ICD-10-CM | POA: Diagnosis not present

## 2019-04-30 DIAGNOSIS — N2581 Secondary hyperparathyroidism of renal origin: Secondary | ICD-10-CM | POA: Diagnosis not present

## 2019-04-30 DIAGNOSIS — Z992 Dependence on renal dialysis: Secondary | ICD-10-CM | POA: Diagnosis not present

## 2019-04-30 DIAGNOSIS — D689 Coagulation defect, unspecified: Secondary | ICD-10-CM | POA: Diagnosis not present

## 2019-04-30 DIAGNOSIS — E876 Hypokalemia: Secondary | ICD-10-CM | POA: Diagnosis not present

## 2019-04-30 DIAGNOSIS — R197 Diarrhea, unspecified: Secondary | ICD-10-CM | POA: Diagnosis not present

## 2019-05-02 DIAGNOSIS — Z992 Dependence on renal dialysis: Secondary | ICD-10-CM | POA: Diagnosis not present

## 2019-05-02 DIAGNOSIS — N2581 Secondary hyperparathyroidism of renal origin: Secondary | ICD-10-CM | POA: Diagnosis not present

## 2019-05-02 DIAGNOSIS — N186 End stage renal disease: Secondary | ICD-10-CM | POA: Diagnosis not present

## 2019-05-02 DIAGNOSIS — R197 Diarrhea, unspecified: Secondary | ICD-10-CM | POA: Diagnosis not present

## 2019-05-02 DIAGNOSIS — D689 Coagulation defect, unspecified: Secondary | ICD-10-CM | POA: Diagnosis not present

## 2019-05-02 DIAGNOSIS — Z23 Encounter for immunization: Secondary | ICD-10-CM | POA: Diagnosis not present

## 2019-05-02 DIAGNOSIS — E876 Hypokalemia: Secondary | ICD-10-CM | POA: Diagnosis not present

## 2019-05-03 DIAGNOSIS — E876 Hypokalemia: Secondary | ICD-10-CM | POA: Diagnosis not present

## 2019-05-03 DIAGNOSIS — D689 Coagulation defect, unspecified: Secondary | ICD-10-CM | POA: Diagnosis not present

## 2019-05-03 DIAGNOSIS — N186 End stage renal disease: Secondary | ICD-10-CM | POA: Diagnosis not present

## 2019-05-03 DIAGNOSIS — Z992 Dependence on renal dialysis: Secondary | ICD-10-CM | POA: Diagnosis not present

## 2019-05-03 DIAGNOSIS — Z23 Encounter for immunization: Secondary | ICD-10-CM | POA: Diagnosis not present

## 2019-05-03 DIAGNOSIS — N2581 Secondary hyperparathyroidism of renal origin: Secondary | ICD-10-CM | POA: Diagnosis not present

## 2019-05-03 DIAGNOSIS — R197 Diarrhea, unspecified: Secondary | ICD-10-CM | POA: Diagnosis not present

## 2019-05-05 ENCOUNTER — Ambulatory Visit: Payer: Self-pay | Admitting: Cardiology

## 2019-05-05 DIAGNOSIS — Z23 Encounter for immunization: Secondary | ICD-10-CM | POA: Diagnosis not present

## 2019-05-05 DIAGNOSIS — Z992 Dependence on renal dialysis: Secondary | ICD-10-CM | POA: Diagnosis not present

## 2019-05-05 DIAGNOSIS — E876 Hypokalemia: Secondary | ICD-10-CM | POA: Diagnosis not present

## 2019-05-05 DIAGNOSIS — N2581 Secondary hyperparathyroidism of renal origin: Secondary | ICD-10-CM | POA: Diagnosis not present

## 2019-05-05 DIAGNOSIS — D689 Coagulation defect, unspecified: Secondary | ICD-10-CM | POA: Diagnosis not present

## 2019-05-05 DIAGNOSIS — R197 Diarrhea, unspecified: Secondary | ICD-10-CM | POA: Diagnosis not present

## 2019-05-05 DIAGNOSIS — N186 End stage renal disease: Secondary | ICD-10-CM | POA: Diagnosis not present

## 2019-05-06 ENCOUNTER — Other Ambulatory Visit: Payer: Self-pay

## 2019-05-06 ENCOUNTER — Other Ambulatory Visit: Payer: Self-pay | Admitting: General Practice

## 2019-05-06 ENCOUNTER — Ambulatory Visit (INDEPENDENT_AMBULATORY_CARE_PROVIDER_SITE_OTHER): Payer: Medicare Other | Admitting: General Practice

## 2019-05-06 ENCOUNTER — Other Ambulatory Visit: Payer: Self-pay | Admitting: Internal Medicine

## 2019-05-06 DIAGNOSIS — Z7901 Long term (current) use of anticoagulants: Secondary | ICD-10-CM

## 2019-05-06 DIAGNOSIS — I4891 Unspecified atrial fibrillation: Secondary | ICD-10-CM

## 2019-05-06 LAB — POCT INR: INR: 4.1 — AB (ref 2.0–3.0)

## 2019-05-06 MED ORDER — WARFARIN SODIUM 5 MG PO TABS: ORAL_TABLET | ORAL | 1 refills | Status: DC

## 2019-05-06 NOTE — Patient Instructions (Addendum)
Pre visit review using our clinic review tool, if applicable. No additional management support is needed unless otherwise documented below in the visit note.  Skip coumadin today and take 1/2 tablet tomorrow.  On Sunday continue to take 1 tablet daily except 1/2 tablet on Monday and Thursdays.  Re-check in 2 weeks.

## 2019-05-06 NOTE — Progress Notes (Signed)
Medical screening examination/treatment/procedure(s) were performed by non-physician practitioner and as supervising physician I was immediately available for consultation/collaboration. I agree with above. Kauri Garson, MD   

## 2019-05-07 DIAGNOSIS — D689 Coagulation defect, unspecified: Secondary | ICD-10-CM | POA: Diagnosis not present

## 2019-05-07 DIAGNOSIS — R197 Diarrhea, unspecified: Secondary | ICD-10-CM | POA: Diagnosis not present

## 2019-05-07 DIAGNOSIS — E876 Hypokalemia: Secondary | ICD-10-CM | POA: Diagnosis not present

## 2019-05-07 DIAGNOSIS — I129 Hypertensive chronic kidney disease with stage 1 through stage 4 chronic kidney disease, or unspecified chronic kidney disease: Secondary | ICD-10-CM | POA: Diagnosis not present

## 2019-05-07 DIAGNOSIS — Z992 Dependence on renal dialysis: Secondary | ICD-10-CM | POA: Diagnosis not present

## 2019-05-07 DIAGNOSIS — N186 End stage renal disease: Secondary | ICD-10-CM | POA: Diagnosis not present

## 2019-05-07 DIAGNOSIS — Z23 Encounter for immunization: Secondary | ICD-10-CM | POA: Diagnosis not present

## 2019-05-07 DIAGNOSIS — N2581 Secondary hyperparathyroidism of renal origin: Secondary | ICD-10-CM | POA: Diagnosis not present

## 2019-05-10 DIAGNOSIS — D689 Coagulation defect, unspecified: Secondary | ICD-10-CM | POA: Diagnosis not present

## 2019-05-10 DIAGNOSIS — Z992 Dependence on renal dialysis: Secondary | ICD-10-CM | POA: Diagnosis not present

## 2019-05-10 DIAGNOSIS — E876 Hypokalemia: Secondary | ICD-10-CM | POA: Diagnosis not present

## 2019-05-10 DIAGNOSIS — N186 End stage renal disease: Secondary | ICD-10-CM | POA: Diagnosis not present

## 2019-05-10 DIAGNOSIS — N2581 Secondary hyperparathyroidism of renal origin: Secondary | ICD-10-CM | POA: Diagnosis not present

## 2019-05-10 DIAGNOSIS — R52 Pain, unspecified: Secondary | ICD-10-CM | POA: Diagnosis not present

## 2019-05-10 DIAGNOSIS — Z23 Encounter for immunization: Secondary | ICD-10-CM | POA: Diagnosis not present

## 2019-05-10 DIAGNOSIS — R197 Diarrhea, unspecified: Secondary | ICD-10-CM | POA: Diagnosis not present

## 2019-05-12 DIAGNOSIS — N186 End stage renal disease: Secondary | ICD-10-CM | POA: Diagnosis not present

## 2019-05-12 DIAGNOSIS — R52 Pain, unspecified: Secondary | ICD-10-CM | POA: Diagnosis not present

## 2019-05-12 DIAGNOSIS — Z992 Dependence on renal dialysis: Secondary | ICD-10-CM | POA: Diagnosis not present

## 2019-05-12 DIAGNOSIS — D689 Coagulation defect, unspecified: Secondary | ICD-10-CM | POA: Diagnosis not present

## 2019-05-12 DIAGNOSIS — E876 Hypokalemia: Secondary | ICD-10-CM | POA: Diagnosis not present

## 2019-05-12 DIAGNOSIS — R197 Diarrhea, unspecified: Secondary | ICD-10-CM | POA: Diagnosis not present

## 2019-05-12 DIAGNOSIS — Z23 Encounter for immunization: Secondary | ICD-10-CM | POA: Diagnosis not present

## 2019-05-12 DIAGNOSIS — N2581 Secondary hyperparathyroidism of renal origin: Secondary | ICD-10-CM | POA: Diagnosis not present

## 2019-05-14 DIAGNOSIS — Z992 Dependence on renal dialysis: Secondary | ICD-10-CM | POA: Diagnosis not present

## 2019-05-14 DIAGNOSIS — D689 Coagulation defect, unspecified: Secondary | ICD-10-CM | POA: Diagnosis not present

## 2019-05-14 DIAGNOSIS — N2581 Secondary hyperparathyroidism of renal origin: Secondary | ICD-10-CM | POA: Diagnosis not present

## 2019-05-14 DIAGNOSIS — N186 End stage renal disease: Secondary | ICD-10-CM | POA: Diagnosis not present

## 2019-05-14 DIAGNOSIS — E876 Hypokalemia: Secondary | ICD-10-CM | POA: Diagnosis not present

## 2019-05-14 DIAGNOSIS — Z23 Encounter for immunization: Secondary | ICD-10-CM | POA: Diagnosis not present

## 2019-05-14 DIAGNOSIS — R197 Diarrhea, unspecified: Secondary | ICD-10-CM | POA: Diagnosis not present

## 2019-05-14 DIAGNOSIS — R52 Pain, unspecified: Secondary | ICD-10-CM | POA: Diagnosis not present

## 2019-05-17 DIAGNOSIS — Z992 Dependence on renal dialysis: Secondary | ICD-10-CM | POA: Diagnosis not present

## 2019-05-17 DIAGNOSIS — R52 Pain, unspecified: Secondary | ICD-10-CM | POA: Diagnosis not present

## 2019-05-17 DIAGNOSIS — E876 Hypokalemia: Secondary | ICD-10-CM | POA: Diagnosis not present

## 2019-05-17 DIAGNOSIS — N2581 Secondary hyperparathyroidism of renal origin: Secondary | ICD-10-CM | POA: Diagnosis not present

## 2019-05-17 DIAGNOSIS — N186 End stage renal disease: Secondary | ICD-10-CM | POA: Diagnosis not present

## 2019-05-17 DIAGNOSIS — Z23 Encounter for immunization: Secondary | ICD-10-CM | POA: Diagnosis not present

## 2019-05-17 DIAGNOSIS — R197 Diarrhea, unspecified: Secondary | ICD-10-CM | POA: Diagnosis not present

## 2019-05-17 DIAGNOSIS — D689 Coagulation defect, unspecified: Secondary | ICD-10-CM | POA: Diagnosis not present

## 2019-05-18 DIAGNOSIS — Z992 Dependence on renal dialysis: Secondary | ICD-10-CM | POA: Diagnosis not present

## 2019-05-18 DIAGNOSIS — N186 End stage renal disease: Secondary | ICD-10-CM | POA: Diagnosis not present

## 2019-05-18 DIAGNOSIS — T82858A Stenosis of vascular prosthetic devices, implants and grafts, initial encounter: Secondary | ICD-10-CM | POA: Diagnosis not present

## 2019-05-18 DIAGNOSIS — I871 Compression of vein: Secondary | ICD-10-CM | POA: Diagnosis not present

## 2019-05-19 DIAGNOSIS — Z992 Dependence on renal dialysis: Secondary | ICD-10-CM | POA: Diagnosis not present

## 2019-05-19 DIAGNOSIS — E876 Hypokalemia: Secondary | ICD-10-CM | POA: Diagnosis not present

## 2019-05-19 DIAGNOSIS — N186 End stage renal disease: Secondary | ICD-10-CM | POA: Diagnosis not present

## 2019-05-19 DIAGNOSIS — R52 Pain, unspecified: Secondary | ICD-10-CM | POA: Diagnosis not present

## 2019-05-19 DIAGNOSIS — Z23 Encounter for immunization: Secondary | ICD-10-CM | POA: Diagnosis not present

## 2019-05-19 DIAGNOSIS — N2581 Secondary hyperparathyroidism of renal origin: Secondary | ICD-10-CM | POA: Diagnosis not present

## 2019-05-19 DIAGNOSIS — D689 Coagulation defect, unspecified: Secondary | ICD-10-CM | POA: Diagnosis not present

## 2019-05-19 DIAGNOSIS — R197 Diarrhea, unspecified: Secondary | ICD-10-CM | POA: Diagnosis not present

## 2019-05-20 ENCOUNTER — Ambulatory Visit (INDEPENDENT_AMBULATORY_CARE_PROVIDER_SITE_OTHER): Payer: Medicare Other | Admitting: General Practice

## 2019-05-20 ENCOUNTER — Other Ambulatory Visit: Payer: Self-pay

## 2019-05-20 DIAGNOSIS — Z7901 Long term (current) use of anticoagulants: Secondary | ICD-10-CM | POA: Diagnosis not present

## 2019-05-20 DIAGNOSIS — I4891 Unspecified atrial fibrillation: Secondary | ICD-10-CM

## 2019-05-20 LAB — POCT INR: INR: 3.5 — AB (ref 2.0–3.0)

## 2019-05-20 NOTE — Progress Notes (Signed)
Medical screening examination/treatment/procedure(s) were performed by non-physician practitioner and as supervising physician I was immediately available for consultation/collaboration. I agree with above. Ellieanna Funderburg, MD   

## 2019-05-20 NOTE — Patient Instructions (Addendum)
Pre visit review using our clinic review tool, if applicable. No additional management support is needed unless otherwise documented below in the visit note.  Skip coumadin today change dosage and take 1 tablet daily except 1/2 tablet on Monday Wed and Fridays.  Re-check in 4 weeks.

## 2019-05-21 DIAGNOSIS — Z23 Encounter for immunization: Secondary | ICD-10-CM | POA: Diagnosis not present

## 2019-05-21 DIAGNOSIS — E876 Hypokalemia: Secondary | ICD-10-CM | POA: Diagnosis not present

## 2019-05-21 DIAGNOSIS — D689 Coagulation defect, unspecified: Secondary | ICD-10-CM | POA: Diagnosis not present

## 2019-05-21 DIAGNOSIS — N186 End stage renal disease: Secondary | ICD-10-CM | POA: Diagnosis not present

## 2019-05-21 DIAGNOSIS — R52 Pain, unspecified: Secondary | ICD-10-CM | POA: Diagnosis not present

## 2019-05-21 DIAGNOSIS — Z992 Dependence on renal dialysis: Secondary | ICD-10-CM | POA: Diagnosis not present

## 2019-05-21 DIAGNOSIS — N2581 Secondary hyperparathyroidism of renal origin: Secondary | ICD-10-CM | POA: Diagnosis not present

## 2019-05-21 DIAGNOSIS — R197 Diarrhea, unspecified: Secondary | ICD-10-CM | POA: Diagnosis not present

## 2019-05-23 ENCOUNTER — Other Ambulatory Visit: Payer: Self-pay

## 2019-05-23 ENCOUNTER — Encounter: Payer: Self-pay | Admitting: Internal Medicine

## 2019-05-23 ENCOUNTER — Ambulatory Visit (INDEPENDENT_AMBULATORY_CARE_PROVIDER_SITE_OTHER): Payer: Medicare Other | Admitting: Internal Medicine

## 2019-05-23 VITALS — BP 130/80 | HR 79 | Temp 98.2°F | Resp 16 | Ht 68.0 in | Wt 149.0 lb

## 2019-05-23 DIAGNOSIS — I69393 Ataxia following cerebral infarction: Secondary | ICD-10-CM | POA: Diagnosis not present

## 2019-05-23 DIAGNOSIS — I10 Essential (primary) hypertension: Secondary | ICD-10-CM

## 2019-05-23 NOTE — Progress Notes (Addendum)
Subjective:  Patient ID: Jason Cotta Sr., male    DOB: Jun 20, 1946  Age: 73 y.o. MRN: [id number]  CC: Hypertension  This visit occurred during the SARS-CoV-2 public health emergency.  Safety protocols were in place, including screening questions prior to the visit, additional usage of staff PPE, and extensive cleaning of exam room while observing appropriate contact time as indicated for disinfecting solutions.    HPI Jason VERONICA Sr. presents for f/up - He complains of a 2-week history of a burning sensation in his right mid anterior thigh.  He denies trauma or injury.  He said the skin looks normal.  He continues to complain of ataxia after a remote CVA and wants to do some physical therapy to help with this.  Outpatient Medications Prior to Visit  Medication Sig Dispense Refill  . acetaminophen (TYLENOL) 325 MG tablet Take 325 mg by mouth every 4 (four) hours as needed for mild pain.     Marland Kitchen allopurinol (ZYLOPRIM) 100 MG tablet Take 1 tablet by mouth once daily 90 tablet 1  . atorvastatin (LIPITOR) 20 MG tablet TAKE 1 TABLET BY MOUTH  DAILY AT 6 PM. 90 tablet 1  . cinacalcet (SENSIPAR) 60 MG tablet Take 60 mg by mouth daily.    . ferric citrate (AURYXIA) 1 GM 210 MG(Fe) tablet Take 210 mg by mouth See admin instructions. Take 210 mg with breakfast, 210 mg with dinner, and 210 mg with a snack at bedtime    . lipase/protease/amylase (CREON) 36000 UNITS CPEP capsule Take 1 capsule (36,000 Units total) by mouth 3 (three) times daily with meals. 56 capsule 0  . metoprolol succinate (TOPROL-XL) 25 MG 24 hr tablet Take 1 tablet (25 mg total) by mouth daily. 90 tablet 0  . warfarin (COUMADIN) 5 MG tablet Take 1 tablet daily except 1/2 tablet on Mon and Fridays or TAKE AS DIRECTED BY ANTICOAGULATION CLINIC 90 tablet 1  . Eluxadoline (VIBERZI) 75 MG TABS Take 75 mg by mouth 2 (two) times daily. (Patient not taking: Reported on 05/23/2019) 60 tablet 3  . sucroferric oxyhydroxide (VELPHORO) 500 MG  chewable tablet Chew 500 mg by mouth 3 (three) times daily with meals.     . cinacalcet (SENSIPAR) 30 MG tablet Take 30 mg by mouth daily.      No facility-administered medications prior to visit.     ROS Review of Systems  Constitutional: Negative.  Negative for diaphoresis and fatigue.  HENT: Negative.   Eyes: Negative for visual disturbance.  Respiratory: Negative for cough, chest tightness, shortness of breath and wheezing.   Cardiovascular: Negative for chest pain, palpitations and leg swelling.  Gastrointestinal: Negative for abdominal pain, diarrhea and nausea.  Endocrine: Negative.   Genitourinary: Negative.  Negative for difficulty urinating.  Musculoskeletal: Positive for gait problem. Negative for arthralgias, back pain and myalgias.  Skin: Negative.  Negative for color change.  Neurological: Positive for dizziness. Negative for weakness, light-headedness and numbness.  Hematological: Negative for adenopathy. Does not bruise/bleed easily.  Psychiatric/Behavioral: Negative.     Objective:  BP 130/80 (BP Location: Right Arm, Patient Position: Sitting, Cuff Size: Normal)   Pulse 79   Temp 98.2 F (36.8 C) (Oral)   Resp 16   Ht 5\' 8"  (1.727 m)   Wt 149 lb (67.6 kg)   SpO2 98%   BMI 22.66 kg/m   BP Readings from Last 3 Encounters:  05/23/19 130/80  04/20/19 110/64  04/15/19 126/70    Wt Readings from Last 3  Encounters:  05/23/19 149 lb (67.6 kg)  04/20/19 148 lb (67.1 kg)  04/15/19 146 lb (66.2 kg)    Physical Exam Vitals signs reviewed.  Constitutional:      Appearance: Normal appearance.  HENT:     Nose: Nose normal.     Mouth/Throat:     Mouth: Mucous membranes are moist.  Eyes:     General: No scleral icterus.    Conjunctiva/sclera: Conjunctivae normal.  Neck:     Musculoskeletal: Neck supple.  Cardiovascular:     Rate and Rhythm: Normal rate and regular rhythm.     Heart sounds: No murmur.  Pulmonary:     Effort: Pulmonary effort is normal.      Breath sounds: No stridor. No wheezing, rhonchi or rales.  Abdominal:     General: Abdomen is protuberant. Bowel sounds are normal. There is no distension.     Palpations: There is no hepatomegaly or splenomegaly.     Tenderness: There is no abdominal tenderness.  Musculoskeletal: Normal range of motion.     Right upper leg: Normal. He exhibits no tenderness, no bony tenderness, no swelling, no edema, no deformity and no laceration.     Right lower leg: No edema.     Left lower leg: No edema.  Lymphadenopathy:     Cervical: No cervical adenopathy.  Skin:    General: Skin is warm and dry.  Neurological:     General: No focal deficit present.     Mental Status: He is alert.     Motor: Weakness and atrophy present.     Coordination: Romberg sign positive. Coordination abnormal.     Comments: There are no new neuro defects     Lab Results  Component Value Date   WBC 6.4 04/08/2019   HGB 13.2 04/08/2019   HCT 42.2 04/08/2019   PLT 179 04/08/2019   GLUCOSE 83 04/08/2019   CHOL 151 08/17/2018   TRIG 114 08/17/2018   HDL 66 08/17/2018   LDLDIRECT 120.0 06/04/2015   LDLCALC 62 08/17/2018   ALT 14 02/26/2019   AST 19 02/26/2019   NA 138 04/08/2019   K 3.7 04/08/2019   CL 98 04/08/2019   CREATININE 6.84 (H) 04/08/2019   BUN 27 (H) 04/08/2019   CO2 25 04/08/2019   TSH 1.83 10/19/2018   PSA 1.86 08/10/2013   INR 3.5 (A) 05/20/2019   HGBA1C 5.5 06/09/2018    Dg Chest 2 View  Result Date: 04/08/2019 CLINICAL DATA:  Dialysis patient has not urinated for the past 2-3 weeks. Ex-smoker. History of a lung nodule. EXAM: CHEST - 2 VIEW COMPARISON:  06/10/2018. Chest CT dated 08/11/2018. FINDINGS: Normal sized heart. Clear lungs. Stable left upper lobe staple line with associated soft tissue density scarring. Left subclavian stent. Thoracic spine degenerative changes and mild scoliosis. Old, healed left rib fracture. IMPRESSION: No acute abnormality. Stable left upper lobe scarring.  Electronically Signed   By: Claudie Revering M.D.   On: 04/08/2019 19:17    Assessment & Plan:   Jason [name] was seen today for hypertension.  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  I think the discomfort in his right thigh is meralgia paresthetica.  I have encouraged him to go ahead and start physical therapy for the ataxia. -     Referral to Neuro Rehab  Benign essential HTN- His blood pressure is  adequately well controlled.   I am having Jason Cotta Sr. maintain his ferric citrate, metoprolol succinate, allopurinol, lipase/protease/amylase, atorvastatin, Viberzi, acetaminophen, Velphoro, warfarin, and cinacalcet.  No orders of the defined types were placed in this encounter.    Follow-up: Return if symptoms worsen or fail to improve.  [name], MD

## 2019-05-23 NOTE — Patient Instructions (Signed)
Ataxia  Ataxia is a condition that causes unsteadiness when walking and standing, poor coordination of body movements, and difficulty keeping a straight (upright) posture. It occurs because of a problem with the part of the brain that controls coordination and stability (cerebellum). Ataxia can develop later in life (acquired ataxia), during your 20s or 30s or even into your 60s or later. This type of ataxia develops when another medical condition, such as a stroke, damages the cerebellum. Ataxia also may be present early in life (non-acquired ataxia). There are two main types of non-acquired ataxia:  Congenital. This type is present at birth.  Hereditary. This type is passed from parent to child. The most common form of hereditary non-acquired ataxia is Friedreich ataxia. What are the causes? Acquired ataxia may be caused by:  Changes in the nervous system (neurodegenerative changes).  Changes throughout the body (systemic disorders).  A lot of exposure to: ? Certain medicines such as phenytoin and lithium. ? Solvents. These are cleaning fluids such as paint thinner, nail polish remover, carpet cleaner, and degreasers.  Alcohol abuse (alcoholism).  Medical conditions, such as: ? Celiac disease. ? Hypothyroidism. ? A lack (deficiency) of vitamin E, vitamin B12, or thiamine. ? Brain tumors. ? Multiple sclerosis. ? Cerebral palsy. ? Stroke. ? Paraneoplastic syndromes. ? Viral infections. ? Head injury. ? Malnutrition. Congenital and hereditary ataxia are caused by problems that are present in genes before birth. What are the signs or symptoms? Signs and symptoms of ataxia vary depending on the cause. They may include:  Being unsteady.  Walking with the legs wide apart (wide stance) to keep one's balance.  Uncontrolled shaking (tremor).  Poorly coordinated body movements.  Difficulty maintaining an upright posture.  Fatigue.  Changes in speech.  Changes in vision.   Involuntary eye movements (nystagmus).  Difficulty swallowing.  Difficulty writing.  Muscle tightening that you cannot control (muscle spasms). How is this diagnosed? Ataxia may be diagnosed based on:  Your personal and family medical history.  A physical exam.  Imaging tests, such as a CT scan or MRI.  Spinal tap (lumbar puncture). This procedure involves using a needle to take a sample of the fluid around your brain and spinal cord.  Genetic testing. How is this treated? The underlying condition that causes your ataxia needs to be treated. If the cause is a brain tumor, you may need surgery. Treatment also focuses on helping you live with ataxia and improving your quality of life (supportive treatments). This may involve:  Learning ways to improve coordination and move around more carefully (physical therapy).  Learning ways to improve your ability to do daily tasks, such as bathing and feeding yourself (occupational therapy).  Using devices to help you move around, eat, or communicate (assistive devices), such as a walker, modified eating utensils, and communication aids.  Learning ways to improve speech and swallowing (speech therapy). Follow these instructions at home: Preventing falls  Lie down right away if you become very unsteady, dizzy, or nauseous, or if you feel like you are going to faint. Do not get up until all of those feelings pass.  Keep your home well-lit. Use night-lights as needed.  Remove tripping hazards, such as rugs, cords, and clutter.  Install grab bars by the toilet and in the tub and shower.  Use assistive devices such as a cane, walker, or wheelchair as needed to keep your balance. General instructions  Do not drink alcohol.  Ask your health care provider what activities are safe  for you, and what activities you should avoid.  Take over-the-counter and prescription medicines only as told by your health care provider. Get help right away  if you:  Have unsteadiness that suddenly worsens.  Have any of these: ? Severe headaches. ? Chest pain. ? Abdominal pain. ? Weakness or numbness on one side of your body. ? Vision problems. ? Difficulty speaking. ? An irregular heartbeat. ? A very fast pulse.  Feel confused. Summary  Ataxia is a condition that causes unsteadiness when walking and standing, poor coordination of body movements, and difficulty keeping a straight (upright) posture.  Ataxia occurs because of a problem with the part of the brain that controls coordination and stability (cerebellum).  The underlying condition that causes your ataxia needs to be treated. Treatment also focuses on helping you live with ataxia and improving your quality of life (supportive treatments).  Lie down right away if you become very unsteady, dizzy, or nauseous, or if you feel like you are going to faint. This information is not intended to replace advice given to you by your health care provider. Make sure you discuss any questions you have with your health care provider. Document Released: 01/18/2014 Document Revised: 06/05/2017 Document Reviewed: 04/24/2017 Elsevier Patient Education  2020 [employer].

## 2019-05-24 DIAGNOSIS — N2581 Secondary hyperparathyroidism of renal origin: Secondary | ICD-10-CM | POA: Diagnosis not present

## 2019-05-24 DIAGNOSIS — Z23 Encounter for immunization: Secondary | ICD-10-CM | POA: Diagnosis not present

## 2019-05-24 DIAGNOSIS — E876 Hypokalemia: Secondary | ICD-10-CM | POA: Diagnosis not present

## 2019-05-24 DIAGNOSIS — R52 Pain, unspecified: Secondary | ICD-10-CM | POA: Diagnosis not present

## 2019-05-24 DIAGNOSIS — Z992 Dependence on renal dialysis: Secondary | ICD-10-CM | POA: Diagnosis not present

## 2019-05-24 DIAGNOSIS — R197 Diarrhea, unspecified: Secondary | ICD-10-CM | POA: Diagnosis not present

## 2019-05-24 DIAGNOSIS — N186 End stage renal disease: Secondary | ICD-10-CM | POA: Diagnosis not present

## 2019-05-24 DIAGNOSIS — D689 Coagulation defect, unspecified: Secondary | ICD-10-CM | POA: Diagnosis not present

## 2019-05-26 DIAGNOSIS — N186 End stage renal disease: Secondary | ICD-10-CM | POA: Diagnosis not present

## 2019-05-26 DIAGNOSIS — Z992 Dependence on renal dialysis: Secondary | ICD-10-CM | POA: Diagnosis not present

## 2019-05-26 DIAGNOSIS — D689 Coagulation defect, unspecified: Secondary | ICD-10-CM | POA: Diagnosis not present

## 2019-05-26 DIAGNOSIS — E876 Hypokalemia: Secondary | ICD-10-CM | POA: Diagnosis not present

## 2019-05-26 DIAGNOSIS — R52 Pain, unspecified: Secondary | ICD-10-CM | POA: Diagnosis not present

## 2019-05-26 DIAGNOSIS — R197 Diarrhea, unspecified: Secondary | ICD-10-CM | POA: Diagnosis not present

## 2019-05-26 DIAGNOSIS — Z23 Encounter for immunization: Secondary | ICD-10-CM | POA: Diagnosis not present

## 2019-05-26 DIAGNOSIS — N2581 Secondary hyperparathyroidism of renal origin: Secondary | ICD-10-CM | POA: Diagnosis not present

## 2019-05-28 DIAGNOSIS — Z23 Encounter for immunization: Secondary | ICD-10-CM | POA: Diagnosis not present

## 2019-05-28 DIAGNOSIS — Z992 Dependence on renal dialysis: Secondary | ICD-10-CM | POA: Diagnosis not present

## 2019-05-28 DIAGNOSIS — E876 Hypokalemia: Secondary | ICD-10-CM | POA: Diagnosis not present

## 2019-05-28 DIAGNOSIS — N186 End stage renal disease: Secondary | ICD-10-CM | POA: Diagnosis not present

## 2019-05-28 DIAGNOSIS — R52 Pain, unspecified: Secondary | ICD-10-CM | POA: Diagnosis not present

## 2019-05-28 DIAGNOSIS — D689 Coagulation defect, unspecified: Secondary | ICD-10-CM | POA: Diagnosis not present

## 2019-05-28 DIAGNOSIS — N2581 Secondary hyperparathyroidism of renal origin: Secondary | ICD-10-CM | POA: Diagnosis not present

## 2019-05-28 DIAGNOSIS — R197 Diarrhea, unspecified: Secondary | ICD-10-CM | POA: Diagnosis not present

## 2019-05-30 DIAGNOSIS — R197 Diarrhea, unspecified: Secondary | ICD-10-CM | POA: Diagnosis not present

## 2019-05-30 DIAGNOSIS — D689 Coagulation defect, unspecified: Secondary | ICD-10-CM | POA: Diagnosis not present

## 2019-05-30 DIAGNOSIS — N2581 Secondary hyperparathyroidism of renal origin: Secondary | ICD-10-CM | POA: Diagnosis not present

## 2019-05-30 DIAGNOSIS — N186 End stage renal disease: Secondary | ICD-10-CM | POA: Diagnosis not present

## 2019-05-30 DIAGNOSIS — E876 Hypokalemia: Secondary | ICD-10-CM | POA: Diagnosis not present

## 2019-05-30 DIAGNOSIS — R52 Pain, unspecified: Secondary | ICD-10-CM | POA: Diagnosis not present

## 2019-05-30 DIAGNOSIS — Z992 Dependence on renal dialysis: Secondary | ICD-10-CM | POA: Diagnosis not present

## 2019-05-30 DIAGNOSIS — Z23 Encounter for immunization: Secondary | ICD-10-CM | POA: Diagnosis not present

## 2019-05-31 ENCOUNTER — Other Ambulatory Visit: Payer: Self-pay | Admitting: Internal Medicine

## 2019-05-31 DIAGNOSIS — I69393 Ataxia following cerebral infarction: Secondary | ICD-10-CM

## 2019-06-01 DIAGNOSIS — Z992 Dependence on renal dialysis: Secondary | ICD-10-CM | POA: Diagnosis not present

## 2019-06-01 DIAGNOSIS — R52 Pain, unspecified: Secondary | ICD-10-CM | POA: Diagnosis not present

## 2019-06-01 DIAGNOSIS — D689 Coagulation defect, unspecified: Secondary | ICD-10-CM | POA: Diagnosis not present

## 2019-06-01 DIAGNOSIS — R197 Diarrhea, unspecified: Secondary | ICD-10-CM | POA: Diagnosis not present

## 2019-06-01 DIAGNOSIS — N186 End stage renal disease: Secondary | ICD-10-CM | POA: Diagnosis not present

## 2019-06-01 DIAGNOSIS — E876 Hypokalemia: Secondary | ICD-10-CM | POA: Diagnosis not present

## 2019-06-01 DIAGNOSIS — N2581 Secondary hyperparathyroidism of renal origin: Secondary | ICD-10-CM | POA: Diagnosis not present

## 2019-06-01 DIAGNOSIS — Z23 Encounter for immunization: Secondary | ICD-10-CM | POA: Diagnosis not present

## 2019-06-03 DIAGNOSIS — H401221 Low-tension glaucoma, left eye, mild stage: Secondary | ICD-10-CM | POA: Diagnosis not present

## 2019-06-03 DIAGNOSIS — H35363 Drusen (degenerative) of macula, bilateral: Secondary | ICD-10-CM | POA: Diagnosis not present

## 2019-06-03 DIAGNOSIS — H401212 Low-tension glaucoma, right eye, moderate stage: Secondary | ICD-10-CM | POA: Diagnosis not present

## 2019-06-03 DIAGNOSIS — H40033 Anatomical narrow angle, bilateral: Secondary | ICD-10-CM | POA: Diagnosis not present

## 2019-06-04 DIAGNOSIS — N186 End stage renal disease: Secondary | ICD-10-CM | POA: Diagnosis not present

## 2019-06-04 DIAGNOSIS — D689 Coagulation defect, unspecified: Secondary | ICD-10-CM | POA: Diagnosis not present

## 2019-06-04 DIAGNOSIS — E876 Hypokalemia: Secondary | ICD-10-CM | POA: Diagnosis not present

## 2019-06-04 DIAGNOSIS — Z23 Encounter for immunization: Secondary | ICD-10-CM | POA: Diagnosis not present

## 2019-06-04 DIAGNOSIS — R197 Diarrhea, unspecified: Secondary | ICD-10-CM | POA: Diagnosis not present

## 2019-06-04 DIAGNOSIS — Z992 Dependence on renal dialysis: Secondary | ICD-10-CM | POA: Diagnosis not present

## 2019-06-04 DIAGNOSIS — R52 Pain, unspecified: Secondary | ICD-10-CM | POA: Diagnosis not present

## 2019-06-04 DIAGNOSIS — N2581 Secondary hyperparathyroidism of renal origin: Secondary | ICD-10-CM | POA: Diagnosis not present

## 2019-06-06 DIAGNOSIS — Z992 Dependence on renal dialysis: Secondary | ICD-10-CM | POA: Diagnosis not present

## 2019-06-06 DIAGNOSIS — I129 Hypertensive chronic kidney disease with stage 1 through stage 4 chronic kidney disease, or unspecified chronic kidney disease: Secondary | ICD-10-CM | POA: Diagnosis not present

## 2019-06-06 DIAGNOSIS — N186 End stage renal disease: Secondary | ICD-10-CM | POA: Diagnosis not present

## 2019-06-07 DIAGNOSIS — N2581 Secondary hyperparathyroidism of renal origin: Secondary | ICD-10-CM | POA: Diagnosis not present

## 2019-06-07 DIAGNOSIS — Z992 Dependence on renal dialysis: Secondary | ICD-10-CM | POA: Diagnosis not present

## 2019-06-07 DIAGNOSIS — D689 Coagulation defect, unspecified: Secondary | ICD-10-CM | POA: Diagnosis not present

## 2019-06-07 DIAGNOSIS — E876 Hypokalemia: Secondary | ICD-10-CM | POA: Diagnosis not present

## 2019-06-07 DIAGNOSIS — D509 Iron deficiency anemia, unspecified: Secondary | ICD-10-CM | POA: Diagnosis not present

## 2019-06-07 DIAGNOSIS — R197 Diarrhea, unspecified: Secondary | ICD-10-CM | POA: Diagnosis not present

## 2019-06-07 DIAGNOSIS — D631 Anemia in chronic kidney disease: Secondary | ICD-10-CM | POA: Diagnosis not present

## 2019-06-07 DIAGNOSIS — N186 End stage renal disease: Secondary | ICD-10-CM | POA: Diagnosis not present

## 2019-06-09 DIAGNOSIS — N2581 Secondary hyperparathyroidism of renal origin: Secondary | ICD-10-CM | POA: Diagnosis not present

## 2019-06-09 DIAGNOSIS — D631 Anemia in chronic kidney disease: Secondary | ICD-10-CM | POA: Diagnosis not present

## 2019-06-09 DIAGNOSIS — D689 Coagulation defect, unspecified: Secondary | ICD-10-CM | POA: Diagnosis not present

## 2019-06-09 DIAGNOSIS — Z992 Dependence on renal dialysis: Secondary | ICD-10-CM | POA: Diagnosis not present

## 2019-06-09 DIAGNOSIS — D509 Iron deficiency anemia, unspecified: Secondary | ICD-10-CM | POA: Diagnosis not present

## 2019-06-09 DIAGNOSIS — N186 End stage renal disease: Secondary | ICD-10-CM | POA: Diagnosis not present

## 2019-06-09 DIAGNOSIS — R197 Diarrhea, unspecified: Secondary | ICD-10-CM | POA: Diagnosis not present

## 2019-06-09 DIAGNOSIS — E876 Hypokalemia: Secondary | ICD-10-CM | POA: Diagnosis not present

## 2019-06-11 DIAGNOSIS — N186 End stage renal disease: Secondary | ICD-10-CM | POA: Diagnosis not present

## 2019-06-11 DIAGNOSIS — E876 Hypokalemia: Secondary | ICD-10-CM | POA: Diagnosis not present

## 2019-06-11 DIAGNOSIS — D689 Coagulation defect, unspecified: Secondary | ICD-10-CM | POA: Diagnosis not present

## 2019-06-11 DIAGNOSIS — N2581 Secondary hyperparathyroidism of renal origin: Secondary | ICD-10-CM | POA: Diagnosis not present

## 2019-06-11 DIAGNOSIS — D509 Iron deficiency anemia, unspecified: Secondary | ICD-10-CM | POA: Diagnosis not present

## 2019-06-11 DIAGNOSIS — D631 Anemia in chronic kidney disease: Secondary | ICD-10-CM | POA: Diagnosis not present

## 2019-06-11 DIAGNOSIS — R197 Diarrhea, unspecified: Secondary | ICD-10-CM | POA: Diagnosis not present

## 2019-06-11 DIAGNOSIS — Z992 Dependence on renal dialysis: Secondary | ICD-10-CM | POA: Diagnosis not present

## 2019-06-14 DIAGNOSIS — R197 Diarrhea, unspecified: Secondary | ICD-10-CM | POA: Diagnosis not present

## 2019-06-14 DIAGNOSIS — D631 Anemia in chronic kidney disease: Secondary | ICD-10-CM | POA: Diagnosis not present

## 2019-06-14 DIAGNOSIS — E876 Hypokalemia: Secondary | ICD-10-CM | POA: Diagnosis not present

## 2019-06-14 DIAGNOSIS — Z992 Dependence on renal dialysis: Secondary | ICD-10-CM | POA: Diagnosis not present

## 2019-06-14 DIAGNOSIS — D509 Iron deficiency anemia, unspecified: Secondary | ICD-10-CM | POA: Diagnosis not present

## 2019-06-14 DIAGNOSIS — D689 Coagulation defect, unspecified: Secondary | ICD-10-CM | POA: Diagnosis not present

## 2019-06-14 DIAGNOSIS — N2581 Secondary hyperparathyroidism of renal origin: Secondary | ICD-10-CM | POA: Diagnosis not present

## 2019-06-14 DIAGNOSIS — N186 End stage renal disease: Secondary | ICD-10-CM | POA: Diagnosis not present

## 2019-06-15 ENCOUNTER — Telehealth: Payer: Self-pay | Admitting: Gastroenterology

## 2019-06-16 DIAGNOSIS — N2581 Secondary hyperparathyroidism of renal origin: Secondary | ICD-10-CM | POA: Diagnosis not present

## 2019-06-16 DIAGNOSIS — D631 Anemia in chronic kidney disease: Secondary | ICD-10-CM | POA: Diagnosis not present

## 2019-06-16 DIAGNOSIS — E876 Hypokalemia: Secondary | ICD-10-CM | POA: Diagnosis not present

## 2019-06-16 DIAGNOSIS — R197 Diarrhea, unspecified: Secondary | ICD-10-CM | POA: Diagnosis not present

## 2019-06-16 DIAGNOSIS — D509 Iron deficiency anemia, unspecified: Secondary | ICD-10-CM | POA: Diagnosis not present

## 2019-06-16 DIAGNOSIS — Z992 Dependence on renal dialysis: Secondary | ICD-10-CM | POA: Diagnosis not present

## 2019-06-16 DIAGNOSIS — D689 Coagulation defect, unspecified: Secondary | ICD-10-CM | POA: Diagnosis not present

## 2019-06-16 DIAGNOSIS — N186 End stage renal disease: Secondary | ICD-10-CM | POA: Diagnosis not present

## 2019-06-16 NOTE — Telephone Encounter (Signed)
Mrs. Wescoat has been notified to pick up paperwork.

## 2019-06-16 NOTE — Telephone Encounter (Signed)
Provider form received. Awaiting Dr Loletha Carrow to complete.

## 2019-06-17 ENCOUNTER — Ambulatory Visit (INDEPENDENT_AMBULATORY_CARE_PROVIDER_SITE_OTHER): Payer: Medicare Other | Admitting: General Practice

## 2019-06-17 ENCOUNTER — Other Ambulatory Visit: Payer: Self-pay

## 2019-06-17 DIAGNOSIS — Z7901 Long term (current) use of anticoagulants: Secondary | ICD-10-CM | POA: Diagnosis not present

## 2019-06-17 DIAGNOSIS — I4891 Unspecified atrial fibrillation: Secondary | ICD-10-CM

## 2019-06-17 LAB — POCT INR: INR: 2.2 (ref 2.0–3.0)

## 2019-06-17 NOTE — Progress Notes (Signed)
Medical screening examination/treatment/procedure(s) were performed by non-physician practitioner and as supervising physician I was immediately available for consultation/collaboration. I agree with above. Cathlean Cower, MD ,

## 2019-06-17 NOTE — Patient Instructions (Signed)
Pre visit review using our clinic review tool, if applicable. No additional management support is needed unless otherwise documented below in the visit note. ° °Continue to take 1 tablet daily except 1/2 tablet on Monday Wed and Fridays.  Re-check in 4 weeks.   °

## 2019-06-18 DIAGNOSIS — R197 Diarrhea, unspecified: Secondary | ICD-10-CM | POA: Diagnosis not present

## 2019-06-18 DIAGNOSIS — N186 End stage renal disease: Secondary | ICD-10-CM | POA: Diagnosis not present

## 2019-06-18 DIAGNOSIS — D631 Anemia in chronic kidney disease: Secondary | ICD-10-CM | POA: Diagnosis not present

## 2019-06-18 DIAGNOSIS — E876 Hypokalemia: Secondary | ICD-10-CM | POA: Diagnosis not present

## 2019-06-18 DIAGNOSIS — D689 Coagulation defect, unspecified: Secondary | ICD-10-CM | POA: Diagnosis not present

## 2019-06-18 DIAGNOSIS — D509 Iron deficiency anemia, unspecified: Secondary | ICD-10-CM | POA: Diagnosis not present

## 2019-06-18 DIAGNOSIS — Z992 Dependence on renal dialysis: Secondary | ICD-10-CM | POA: Diagnosis not present

## 2019-06-18 DIAGNOSIS — N2581 Secondary hyperparathyroidism of renal origin: Secondary | ICD-10-CM | POA: Diagnosis not present

## 2019-06-21 DIAGNOSIS — D631 Anemia in chronic kidney disease: Secondary | ICD-10-CM | POA: Diagnosis not present

## 2019-06-21 DIAGNOSIS — Z992 Dependence on renal dialysis: Secondary | ICD-10-CM | POA: Diagnosis not present

## 2019-06-21 DIAGNOSIS — N2581 Secondary hyperparathyroidism of renal origin: Secondary | ICD-10-CM | POA: Diagnosis not present

## 2019-06-21 DIAGNOSIS — D689 Coagulation defect, unspecified: Secondary | ICD-10-CM | POA: Diagnosis not present

## 2019-06-21 DIAGNOSIS — R197 Diarrhea, unspecified: Secondary | ICD-10-CM | POA: Diagnosis not present

## 2019-06-21 DIAGNOSIS — N186 End stage renal disease: Secondary | ICD-10-CM | POA: Diagnosis not present

## 2019-06-21 DIAGNOSIS — D509 Iron deficiency anemia, unspecified: Secondary | ICD-10-CM | POA: Diagnosis not present

## 2019-06-21 DIAGNOSIS — E876 Hypokalemia: Secondary | ICD-10-CM | POA: Diagnosis not present

## 2019-06-23 DIAGNOSIS — N2581 Secondary hyperparathyroidism of renal origin: Secondary | ICD-10-CM | POA: Diagnosis not present

## 2019-06-23 DIAGNOSIS — Z992 Dependence on renal dialysis: Secondary | ICD-10-CM | POA: Diagnosis not present

## 2019-06-23 DIAGNOSIS — D631 Anemia in chronic kidney disease: Secondary | ICD-10-CM | POA: Diagnosis not present

## 2019-06-23 DIAGNOSIS — N186 End stage renal disease: Secondary | ICD-10-CM | POA: Diagnosis not present

## 2019-06-23 DIAGNOSIS — D509 Iron deficiency anemia, unspecified: Secondary | ICD-10-CM | POA: Diagnosis not present

## 2019-06-23 DIAGNOSIS — D689 Coagulation defect, unspecified: Secondary | ICD-10-CM | POA: Diagnosis not present

## 2019-06-23 DIAGNOSIS — R197 Diarrhea, unspecified: Secondary | ICD-10-CM | POA: Diagnosis not present

## 2019-06-23 DIAGNOSIS — E876 Hypokalemia: Secondary | ICD-10-CM | POA: Diagnosis not present

## 2019-06-24 ENCOUNTER — Ambulatory Visit: Payer: Medicare Other

## 2019-06-25 DIAGNOSIS — N186 End stage renal disease: Secondary | ICD-10-CM | POA: Diagnosis not present

## 2019-06-25 DIAGNOSIS — D689 Coagulation defect, unspecified: Secondary | ICD-10-CM | POA: Diagnosis not present

## 2019-06-25 DIAGNOSIS — D631 Anemia in chronic kidney disease: Secondary | ICD-10-CM | POA: Diagnosis not present

## 2019-06-25 DIAGNOSIS — N2581 Secondary hyperparathyroidism of renal origin: Secondary | ICD-10-CM | POA: Diagnosis not present

## 2019-06-25 DIAGNOSIS — D509 Iron deficiency anemia, unspecified: Secondary | ICD-10-CM | POA: Diagnosis not present

## 2019-06-25 DIAGNOSIS — E876 Hypokalemia: Secondary | ICD-10-CM | POA: Diagnosis not present

## 2019-06-25 DIAGNOSIS — R197 Diarrhea, unspecified: Secondary | ICD-10-CM | POA: Diagnosis not present

## 2019-06-25 DIAGNOSIS — Z992 Dependence on renal dialysis: Secondary | ICD-10-CM | POA: Diagnosis not present

## 2019-06-27 DIAGNOSIS — D689 Coagulation defect, unspecified: Secondary | ICD-10-CM | POA: Diagnosis not present

## 2019-06-27 DIAGNOSIS — D631 Anemia in chronic kidney disease: Secondary | ICD-10-CM | POA: Diagnosis not present

## 2019-06-27 DIAGNOSIS — N2581 Secondary hyperparathyroidism of renal origin: Secondary | ICD-10-CM | POA: Diagnosis not present

## 2019-06-27 DIAGNOSIS — Z992 Dependence on renal dialysis: Secondary | ICD-10-CM | POA: Diagnosis not present

## 2019-06-27 DIAGNOSIS — N186 End stage renal disease: Secondary | ICD-10-CM | POA: Diagnosis not present

## 2019-06-27 DIAGNOSIS — E876 Hypokalemia: Secondary | ICD-10-CM | POA: Diagnosis not present

## 2019-06-27 DIAGNOSIS — D509 Iron deficiency anemia, unspecified: Secondary | ICD-10-CM | POA: Diagnosis not present

## 2019-06-27 DIAGNOSIS — R197 Diarrhea, unspecified: Secondary | ICD-10-CM | POA: Diagnosis not present

## 2019-06-28 DIAGNOSIS — Z992 Dependence on renal dialysis: Secondary | ICD-10-CM | POA: Diagnosis not present

## 2019-06-28 DIAGNOSIS — E876 Hypokalemia: Secondary | ICD-10-CM | POA: Diagnosis not present

## 2019-06-28 DIAGNOSIS — D631 Anemia in chronic kidney disease: Secondary | ICD-10-CM | POA: Diagnosis not present

## 2019-06-28 DIAGNOSIS — N2581 Secondary hyperparathyroidism of renal origin: Secondary | ICD-10-CM | POA: Diagnosis not present

## 2019-06-28 DIAGNOSIS — N186 End stage renal disease: Secondary | ICD-10-CM | POA: Diagnosis not present

## 2019-06-28 DIAGNOSIS — D689 Coagulation defect, unspecified: Secondary | ICD-10-CM | POA: Diagnosis not present

## 2019-06-28 DIAGNOSIS — R197 Diarrhea, unspecified: Secondary | ICD-10-CM | POA: Diagnosis not present

## 2019-06-28 DIAGNOSIS — D509 Iron deficiency anemia, unspecified: Secondary | ICD-10-CM | POA: Diagnosis not present

## 2019-06-29 ENCOUNTER — Other Ambulatory Visit: Payer: Self-pay

## 2019-06-29 ENCOUNTER — Ambulatory Visit: Payer: Medicare Other | Admitting: Podiatry

## 2019-06-29 ENCOUNTER — Encounter: Payer: Self-pay | Admitting: Podiatry

## 2019-06-29 ENCOUNTER — Ambulatory Visit (INDEPENDENT_AMBULATORY_CARE_PROVIDER_SITE_OTHER): Payer: Medicare Other

## 2019-06-29 DIAGNOSIS — M79672 Pain in left foot: Secondary | ICD-10-CM

## 2019-06-29 DIAGNOSIS — B351 Tinea unguium: Secondary | ICD-10-CM

## 2019-06-29 DIAGNOSIS — M7732 Calcaneal spur, left foot: Secondary | ICD-10-CM | POA: Diagnosis not present

## 2019-06-29 DIAGNOSIS — M722 Plantar fascial fibromatosis: Secondary | ICD-10-CM

## 2019-06-29 DIAGNOSIS — M79675 Pain in left toe(s): Secondary | ICD-10-CM

## 2019-06-29 DIAGNOSIS — I10 Essential (primary) hypertension: Secondary | ICD-10-CM | POA: Diagnosis not present

## 2019-06-29 DIAGNOSIS — M79676 Pain in unspecified toe(s): Secondary | ICD-10-CM | POA: Diagnosis not present

## 2019-06-29 NOTE — Patient Instructions (Addendum)
Plantar Fasciitis (Heel Spur Syndrome) with Rehab The plantar fascia is a fibrous, ligament-like, soft-tissue structure that spans the bottom of the foot. Plantar fasciitis is a condition that causes pain in the foot due to inflammation of the tissue. SYMPTOMS   Pain and tenderness on the underneath side of the foot.  Pain that worsens with standing or walking. CAUSES  Plantar fasciitis is caused by irritation and injury to the plantar fascia on the underneath side of the foot. Common mechanisms of injury include:  Direct trauma to bottom of the foot.  Damage to a small nerve that runs under the foot where the main fascia attaches to the heel bone.  Stress placed on the plantar fascia due to bone spurs. RISK INCREASES WITH:   Activities that place stress on the plantar fascia (running, jumping, pivoting, or cutting).  Poor strength and flexibility.  Improperly fitted shoes.  Tight calf muscles.  Flat feet.  Failure to warm-up properly before activity.  Obesity. PREVENTION  Warm up and stretch properly before activity.  Allow for adequate recovery between workouts.  Maintain physical fitness:  Strength, flexibility, and endurance.  Cardiovascular fitness.  Maintain a health body weight.  Avoid stress on the plantar fascia.  Wear properly fitted shoes, including arch supports for individuals who have flat feet.  PROGNOSIS  If treated properly, then the symptoms of plantar fasciitis usually resolve without surgery. However, occasionally surgery is necessary.  RELATED COMPLICATIONS   Recurrent symptoms that may result in a chronic condition.  Problems of the lower back that are caused by compensating for the injury, such as limping.  Pain or weakness of the foot during push-off following surgery.  Chronic inflammation, scarring, and partial or complete fascia tear, occurring more often from repeated injections.  TREATMENT  Treatment initially involves the  use of ice and medication to help reduce pain and inflammation. The use of strengthening and stretching exercises may help reduce pain with activity, especially stretches of the Achilles tendon. These exercises may be performed at home or with a therapist. Your caregiver may recommend that you use heel cups of arch supports to help reduce stress on the plantar fascia. Occasionally, corticosteroid injections are given to reduce inflammation. If symptoms persist for greater than 6 months despite non-surgical (conservative), then surgery may be recommended.   MEDICATION   If pain medication is necessary, then nonsteroidal anti-inflammatory medications, such as aspirin and ibuprofen, or other minor pain relievers, such as acetaminophen, are often recommended.  Do not take pain medication within 7 days before surgery.  Prescription pain relievers may be given if deemed necessary by your caregiver. Use only as directed and only as much as you need.  Corticosteroid injections may be given by your caregiver. These injections should be reserved for the most serious cases, because they may only be given a certain number of times.  HEAT AND COLD  Cold treatment (icing) relieves pain and reduces inflammation. Cold treatment should be applied for 10 to 15 minutes every 2 to 3 hours for inflammation and pain and immediately after any activity that aggravates your symptoms. Use ice packs or massage the area with a piece of ice (ice massage).  Heat treatment may be used prior to performing the stretching and strengthening activities prescribed by your caregiver, physical therapist, or athletic trainer. Use a heat pack or soak the injury in warm water.  SEEK IMMEDIATE MEDICAL CARE IF:  Treatment seems to offer no benefit, or the condition worsens.  Any medications  produce adverse side effects.  EXERCISES- RANGE OF MOTION (ROM) AND STRETCHING EXERCISES - Plantar Fasciitis (Heel Spur Syndrome) These exercises  may help you when beginning to rehabilitate your injury. Your symptoms may resolve with or without further involvement from your physician, physical therapist or athletic trainer. While completing these exercises, remember:   Restoring tissue flexibility helps normal motion to return to the joints. This allows healthier, less painful movement and activity.  An effective stretch should be held for at least 30 seconds.  A stretch should never be painful. You should only feel a gentle lengthening or release in the stretched tissue.  RANGE OF MOTION - Toe Extension, Flexion  Sit with your right / left leg crossed over your opposite knee.  Grasp your toes and gently pull them back toward the top of your foot. You should feel a stretch on the bottom of your toes and/or foot.  Hold this stretch for 10 seconds.  Now, gently pull your toes toward the bottom of your foot. You should feel a stretch on the top of your toes and or foot.  Hold this stretch for 10 seconds. Repeat  times. Complete this stretch 3 times per day.   RANGE OF MOTION - Ankle Dorsiflexion, Active Assisted  Remove shoes and sit on a chair that is preferably not on a carpeted surface.  Place right / left foot under knee. Extend your opposite leg for support.  Keeping your heel down, slide your right / left foot back toward the chair until you feel a stretch at your ankle or calf. If you do not feel a stretch, slide your bottom forward to the edge of the chair, while still keeping your heel down.  Hold this stretch for 10 seconds. Repeat 3 times. Complete this stretch 2 times per day.   STRETCH  Gastroc, Standing  Place hands on wall.  Extend right / left leg, keeping the front knee somewhat bent.  Slightly point your toes inward on your back foot.  Keeping your right / left heel on the floor and your knee straight, shift your weight toward the wall, not allowing your back to arch.  You should feel a gentle stretch  in the right / left calf. Hold this position for 10 seconds. Repeat 3 times. Complete this stretch 2 times per day.  STRETCH  Soleus, Standing  Place hands on wall.  Extend right / left leg, keeping the other knee somewhat bent.  Slightly point your toes inward on your back foot.  Keep your right / left heel on the floor, bend your back knee, and slightly shift your weight over the back leg so that you feel a gentle stretch deep in your back calf.  Hold this position for 10 seconds. Repeat 3 times. Complete this stretch 2 times per day.  STRETCH  Gastrocsoleus, Standing  Note: This exercise can place a lot of stress on your foot and ankle. Please complete this exercise only if specifically instructed by your caregiver.   Place the ball of your right / left foot on a step, keeping your other foot firmly on the same step.  Hold on to the wall or a rail for balance.  Slowly lift your other foot, allowing your body weight to press your heel down over the edge of the step.  You should feel a stretch in your right / left calf.  Hold this position for 10 seconds.  Repeat this exercise with a slight bend in your right /  left knee. Repeat 3 times. Complete this stretch 2 times per day.   STRENGTHENING EXERCISES - Plantar Fasciitis (Heel Spur Syndrome)  These exercises may help you when beginning to rehabilitate your injury. They may resolve your symptoms with or without further involvement from your physician, physical therapist or athletic trainer. While completing these exercises, remember:   Muscles can gain both the endurance and the strength needed for everyday activities through controlled exercises.  Complete these exercises as instructed by your physician, physical therapist or athletic trainer. Progress the resistance and repetitions only as guided.  STRENGTH - Towel Curls  Sit in a chair positioned on a non-carpeted surface.  Place your foot on a towel, keeping your heel  on the floor.  Pull the towel toward your heel by only curling your toes. Keep your heel on the floor. Repeat 3 times. Complete this exercise 2 times per day.  STRENGTH - Ankle Inversion  Secure one end of a rubber exercise band/tubing to a fixed object (table, pole). Loop the other end around your foot just before your toes.  Place your fists between your knees. This will focus your strengthening at your ankle.  Slowly, pull your big toe up and in, making sure the band/tubing is positioned to resist the entire motion.  Hold this position for 10 seconds.  Have your muscles resist the band/tubing as it slowly pulls your foot back to the starting position. Repeat 3 times. Complete this exercises 2 times per day.  Document Released: 06/23/2005 Document Revised: 09/15/2011 Document Reviewed: 10/05/2008 [facility] Patient Information 2014 [place], [place]. Peripheral Neuropathy Peripheral neuropathy is a type of nerve damage. It affects nerves that carry signals between the spinal cord and the arms, legs, and the rest of the body (peripheral nerves). It does not affect nerves in the spinal cord or brain. In peripheral neuropathy, one nerve or a group of nerves may be damaged. Peripheral neuropathy is a broad category that includes many specific nerve disorders, like diabetic neuropathy, hereditary neuropathy, and carpal tunnel syndrome. What are the causes? This condition may be caused by:  Diabetes. This is the most common cause of peripheral neuropathy.  Nerve injury.  Pressure or stress on a nerve that lasts a long time.  Lack (deficiency) of B vitamins. This can result from alcoholism, poor diet, or a restricted diet.  Infections.  Autoimmune diseases, such as rheumatoid arthritis and systemic lupus erythematosus.  Nerve diseases that are passed from parent to child (inherited).  Some medicines, such as cancer medicines (chemotherapy).  Poisonous (toxic) substances, such as lead  and mercury.  Too little blood flowing to the legs.  Kidney disease.  Thyroid disease. In some cases, the cause of this condition is not known. What are the signs or symptoms? Symptoms of this condition depend on which of your nerves is damaged. Common symptoms include:  Loss of feeling (numbness) in the feet, hands, or both.  Tingling in the feet, hands, or both.  Burning pain.  Very sensitive skin.  Weakness.  Not being able to move a part of the body (paralysis).  Muscle twitching.  Clumsiness or poor coordination.  Loss of balance.  Not being able to control your bladder.  Feeling dizzy.  Sexual problems. How is this diagnosed? Diagnosing and finding the cause of peripheral neuropathy can be difficult. Your health care provider will take your medical history and do a physical exam. A neurological exam will also be done. This involves checking things that are affected by your  brain, spinal cord, and nerves (nervous system). For example, your health care provider will check your reflexes, how you move, and what you can feel. You may have other tests, such as:  Blood tests.  Electromyogram (EMG) and nerve conduction tests. These tests check nerve function and how well the nerves are controlling the muscles.  Imaging tests, such as CT scans or MRI to rule out other causes of your symptoms.  Removing a small piece of nerve to be examined in a lab (nerve biopsy). This is rare.  Removing and examining a small amount of the fluid that surrounds the brain and spinal cord (lumbar puncture). This is rare. How is this treated? Treatment for this condition may involve:  Treating the underlying cause of the neuropathy, such as diabetes, kidney disease, or vitamin deficiencies.  Stopping medicines that can cause neuropathy, such as chemotherapy.  Medicine to relieve pain. Medicines may include: ? Prescription or over-the-counter pain medicine. ? Antiseizure  medicine. ? Antidepressants. ? Pain-relieving patches that are applied to painful areas of skin.  Surgery to relieve pressure on a nerve or to destroy a nerve that is causing pain.  Physical therapy to help improve movement and balance.  Devices to help you move around (assistive devices). Follow these instructions at home: Medicines  Take over-the-counter and prescription medicines only as told by your health care provider. Do not take any other medicines without first asking your health care provider.  Do not drive or use heavy machinery while taking prescription pain medicine. Lifestyle   Do not use any products that contain nicotine or tobacco, such as cigarettes and e-cigarettes. Smoking keeps blood from reaching damaged nerves. If you need help quitting, ask your health care provider.  Avoid or limit alcohol. Too much alcohol can cause a vitamin B deficiency, and vitamin B is needed for healthy nerves.  Eat a healthy diet. This includes: ? Eating foods that are high in fiber, such as fresh fruits and vegetables, whole grains, and beans. ? Limiting foods that are high in fat and processed sugars, such as fried or sweet foods. General instructions   If you have diabetes, work closely with your health care provider to keep your blood sugar under control.  If you have numbness in your feet: ? Check every day for signs of injury or infection. Watch for redness, warmth, and swelling. ? Wear padded socks and comfortable shoes. These help protect your feet.  Develop a good support system. Living with peripheral neuropathy can be stressful. Consider talking with a mental health specialist or joining a support group.  Use assistive devices and attend physical therapy as told by your health care provider. This may include using a walker or a cane.  Keep all follow-up visits as told by your health care provider. This is important. Contact a health care provider if:  You have new  signs or symptoms of peripheral neuropathy.  You are struggling emotionally from dealing with peripheral neuropathy.  Your pain is not well-controlled. Get help right away if:  You have an injury or infection that is not healing normally.  You develop new weakness in an arm or leg.  You fall frequently. Summary  Peripheral neuropathy is when the nerves in the arms, or legs are damaged, resulting in numbness, weakness, or pain.  There are many causes of peripheral neuropathy, including diabetes, pinched nerves, vitamin deficiencies, autoimmune disease, and hereditary conditions.  Diagnosing and finding the cause of peripheral neuropathy can be difficult. Your health  care provider will take your medical history, do a physical exam, and do tests, including blood tests and nerve function tests.  Treatment involves treating the underlying cause of the neuropathy and taking medicines to help control pain. Physical therapy and assistive devices may also help. This information is not intended to replace advice given to you by your health care provider. Make sure you discuss any questions you have with your health care provider. Document Released: 06/13/2002 Document Revised: 06/05/2017 Document Reviewed: 09/01/2016 [employer] Patient Education  [place] are small areas of thickened skin that occur on the top, sides, or tip of a toe. They contain a cone-shaped core with a point that can press on a nerve below. This causes pain.  Calluses are areas of thickened skin that can occur anywhere on the body, including the hands, fingers, palms, soles of the feet, and heels. Calluses are usually larger than corns. What are the causes? Corns and calluses are caused by rubbing (friction) or pressure, such as from shoes that are too tight or do not fit properly. What increases the risk? Corns are more likely to develop in people who have misshapen toes (toe deformities),  such as hammer toes. Calluses can occur with friction to any area of the skin. They are more likely to develop in people who:  Work with their hands.  Wear shoes that fit poorly, are too tight, or are high-heeled.  Have toe deformities. What are the signs or symptoms? Symptoms of a corn or callus include:  A hard growth on the skin.  Pain or tenderness under the skin.  Redness and swelling.  Increased discomfort while wearing tight-fitting shoes, if your feet are affected. If a corn or callus becomes infected, symptoms may include:  Redness and swelling that gets worse.  Pain.  Fluid, blood, or pus draining from the corn or callus. How is this diagnosed? Corns and calluses may be diagnosed based on your symptoms, your medical history, and a physical exam. How is this treated? Treatment for corns and calluses may include:  Removing the cause of the friction or pressure. This may involve: ? Changing your shoes. ? Wearing shoe inserts (orthotics) or other protective layers in your shoes, such as a corn pad. ? Wearing gloves.  Applying medicine to the skin (topical medicine) to help soften skin in the hardened, thickened areas.  Removing layers of dead skin with a file to reduce the size of the corn or callus.  Removing the corn or callus with a scalpel or laser.  Taking antibiotic medicines, if your corn or callus is infected.  Having surgery, if a toe deformity is the cause. Follow these instructions at home:   Take over-the-counter and prescription medicines only as told by your health care provider.  If you were prescribed an antibiotic, take it as told by your health care provider. Do not stop taking it even if your condition starts to improve.  Wear shoes that fit well. Avoid wearing high-heeled shoes and shoes that are too tight or too loose.  Wear any padding, protective layers, gloves, or orthotics as told by your health care provider.  Soak your hands or  feet and then use a file or pumice stone to soften your corn or callus. Do this as told by your health care provider.  Check your corn or callus every day for symptoms of infection. Contact a health care provider if you:  Notice that your symptoms do not  improve with treatment.  Have redness or swelling that gets worse.  Notice that your corn or callus becomes painful.  Have fluid, blood, or pus coming from your corn or callus.  Have new symptoms. Summary  Corns are small areas of thickened skin that occur on the top, sides, or tip of a toe.  Calluses are areas of thickened skin that can occur anywhere on the body, including the hands, fingers, palms, and soles of the feet. Calluses are usually larger than corns.  Corns and calluses are caused by rubbing (friction) or pressure, such as from shoes that are too tight or do not fit properly.  Treatment may include wearing any padding, protective layers, gloves, or orthotics as told by your health care provider. This information is not intended to replace advice given to you by your health care provider. Make sure you discuss any questions you have with your health care provider. Document Released: 03/29/2004 Document Revised: 10/13/2018 Document Reviewed: 05/06/2017 [employer]vier Patient Education  2020 [employer].

## 2019-06-30 DIAGNOSIS — N186 End stage renal disease: Secondary | ICD-10-CM | POA: Diagnosis not present

## 2019-06-30 DIAGNOSIS — Z992 Dependence on renal dialysis: Secondary | ICD-10-CM | POA: Diagnosis not present

## 2019-06-30 DIAGNOSIS — D509 Iron deficiency anemia, unspecified: Secondary | ICD-10-CM | POA: Diagnosis not present

## 2019-06-30 DIAGNOSIS — E876 Hypokalemia: Secondary | ICD-10-CM | POA: Diagnosis not present

## 2019-06-30 DIAGNOSIS — N2581 Secondary hyperparathyroidism of renal origin: Secondary | ICD-10-CM | POA: Diagnosis not present

## 2019-06-30 DIAGNOSIS — R197 Diarrhea, unspecified: Secondary | ICD-10-CM | POA: Diagnosis not present

## 2019-06-30 DIAGNOSIS — D631 Anemia in chronic kidney disease: Secondary | ICD-10-CM | POA: Diagnosis not present

## 2019-06-30 DIAGNOSIS — D689 Coagulation defect, unspecified: Secondary | ICD-10-CM | POA: Diagnosis not present

## 2019-07-02 ENCOUNTER — Ambulatory Visit (HOSPITAL_COMMUNITY)
Admission: EM | Admit: 2019-07-02 | Discharge: 2019-07-02 | Disposition: A | Discharge status: Home / Self Care | Payer: Medicare Other | Attending: Emergency Medicine | Admitting: Emergency Medicine

## 2019-07-02 ENCOUNTER — Encounter (HOSPITAL_COMMUNITY): Payer: Self-pay

## 2019-07-02 ENCOUNTER — Other Ambulatory Visit: Payer: Self-pay

## 2019-07-02 DIAGNOSIS — Z20828 Contact with and (suspected) exposure to other viral communicable diseases: Secondary | ICD-10-CM

## 2019-07-02 DIAGNOSIS — Z20822 Contact with and (suspected) exposure to covid-19: Secondary | ICD-10-CM

## 2019-07-02 NOTE — Discharge Instructions (Signed)
Person Under Monitoring Name: Jason DELCARLO Sr.  Location: [place] [address]   Infection Prevention Recommendations for Individuals Confirmed to have, or Being Evaluated for, 2019 Novel Coronavirus (COVID-19) Infection Who Receive Care at Home  Individuals who are confirmed to have, or are being evaluated for, COVID-19 should follow the prevention steps below until a healthcare provider or local or state health department says they can return to normal activities.  Stay home except to get medical care You should restrict activities outside your home, except for getting medical care. Do not go to work, school, or public areas, and do not use public transportation or taxis.  Call ahead before visiting your doctor Before your medical appointment, call the healthcare provider and tell them that you have, or are being evaluated for, COVID-19 infection. This will help the healthcare provider's office take steps to keep other people from getting infected. Ask your healthcare provider to call the local or state health department.  Monitor your symptoms Seek prompt medical attention if your illness is worsening (e.g., difficulty breathing). Before going to your medical appointment, call the healthcare provider and tell them that you have, or are being evaluated for, COVID-19 infection. Ask your healthcare provider to call the local or state health department.  Wear a facemask You should wear a facemask that covers your nose and mouth when you are in the same room with other people and when you visit a healthcare provider. People who live with or visit you should also wear a facemask while they are in the same room with you.  Separate yourself from other people in your home As much as possible, you should stay in a different room from other people in your home. Also, you should use a separate bathroom, if available.  Avoid sharing household items You should not  share dishes, drinking glasses, cups, eating utensils, towels, bedding, or other items with other people in your home. After using these items, you should wash them thoroughly with soap and water.  Cover your coughs and sneezes Cover your mouth and nose with a tissue when you cough or sneeze, or you can cough or sneeze into your sleeve. Throw used tissues in a lined trash can, and immediately wash your hands with soap and water for at least 20 seconds or use an alcohol-based hand rub.  Wash your [facility] your hands often and thoroughly with soap and water for at least 20 seconds. You can use an alcohol-based hand sanitizer if soap and water are not available and if your hands are not visibly dirty. Avoid touching your eyes, nose, and mouth with unwashed hands.   Prevention Steps for Caregivers and Household Members of Individuals Confirmed to have, or Being Evaluated for, COVID-19 Infection Being Cared for in the Home  If you live with, or provide care at home for, a person confirmed to have, or being evaluated for, COVID-19 infection please follow these guidelines to prevent infection:  Follow healthcare provider's instructions Make sure that you understand and can help the patient follow any healthcare provider instructions for all care.  Provide for the patient's basic needs You should help the patient with basic needs in the home and provide support for getting groceries, prescriptions, and other personal needs.  Monitor the patient's symptoms If they are getting sicker, call his or her medical provider and tell them that the patient has, or is being evaluated for, COVID-19 infection. This will help the healthcare provider's  office take steps to keep other people from getting infected. Ask the healthcare provider to call the local or state health department.  Limit the number of people who have contact with the patient If possible, have only one caregiver for the  patient. Other household members should stay in another home or place of residence. If this is not possible, they should stay in another room, or be separated from the patient as much as possible. Use a separate bathroom, if available. Restrict visitors who do not have an essential need to be in the home.  Keep older adults, very young children, and other sick people away from the patient Keep older adults, very young children, and those who have compromised immune systems or chronic health conditions away from the patient. This includes people with chronic heart, lung, or kidney conditions, diabetes, and cancer.  Ensure good ventilation Make sure that shared spaces in the home have good air flow, such as from an air conditioner or an opened window, weather permitting.  Wash your hands often Wash your hands often and thoroughly with soap and water for at least 20 seconds. You can use an alcohol based hand sanitizer if soap and water are not available and if your hands are not visibly dirty. Avoid touching your eyes, nose, and mouth with unwashed hands. Use disposable paper towels to dry your hands. If not available, use dedicated cloth towels and replace them when they become wet.  Wear a facemask and gloves Wear a disposable facemask at all times in the room and gloves when you touch or have contact with the patient's blood, body fluids, and/or secretions or excretions, such as sweat, saliva, sputum, nasal mucus, vomit, urine, or feces.  Ensure the mask fits over your nose and mouth tightly, and do not touch it during use. Throw out disposable facemasks and gloves after using them. Do not reuse. Wash your hands immediately after removing your facemask and gloves. If your personal clothing becomes contaminated, carefully remove clothing and launder. Wash your hands after handling contaminated clothing. Place all used disposable facemasks, gloves, and other waste in a lined container before  disposing them with other household waste. Remove gloves and wash your hands immediately after handling these items.  Do not share dishes, glasses, or other household items with the patient Avoid sharing household items. You should not share dishes, drinking glasses, cups, eating utensils, towels, bedding, or other items with a patient who is confirmed to have, or being evaluated for, COVID-19 infection. After the person uses these items, you should wash them thoroughly with soap and water.  Wash laundry thoroughly Immediately remove and wash clothes or bedding that have blood, body fluids, and/or secretions or excretions, such as sweat, saliva, sputum, nasal mucus, vomit, urine, or feces, on them. Wear gloves when handling laundry from the patient. Read and follow directions on labels of laundry or clothing items and detergent. In general, wash and dry with the warmest temperatures recommended on the label.  Clean all areas the individual has used often Clean all touchable surfaces, such as counters, tabletops, doorknobs, bathroom fixtures, toilets, phones, keyboards, tablets, and bedside tables, every day. Also, clean any surfaces that may have blood, body fluids, and/or secretions or excretions on them. Wear gloves when cleaning surfaces the patient has come in contact with. Use a diluted bleach solution (e.g., dilute bleach with 1 part bleach and 10 parts water) or a household disinfectant with a label that says EPA-registered for coronaviruses. To make a  bleach solution at home, add 1 tablespoon of bleach to 1 quart (4 cups) of water. For a larger supply, add  cup of bleach to 1 gallon (16 cups) of water. Read labels of cleaning products and follow recommendations provided on product labels. Labels contain instructions for safe and effective use of the cleaning product including precautions you should take when applying the product, such as wearing gloves or eye protection and making sure you  have good ventilation during use of the product. Remove gloves and wash hands immediately after cleaning.  Monitor yourself for signs and symptoms of illness Caregivers and household members are considered close contacts, should monitor their health, and will be asked to limit movement outside of the home to the extent possible. Follow the monitoring steps for close contacts listed on the symptom monitoring form.   ? If you have additional questions, contact your local health department or call the epidemiologist on call at [phone] (available 24/7). ? This guidance is subject to change. For the most up-to-date guidance from [facility], please refer to their website: [email or web address]

## 2019-07-02 NOTE — ED Triage Notes (Signed)
Pt presents for covid testing with non direct exposure and no symptoms.

## 2019-07-03 DIAGNOSIS — N186 End stage renal disease: Secondary | ICD-10-CM | POA: Diagnosis not present

## 2019-07-03 DIAGNOSIS — N2581 Secondary hyperparathyroidism of renal origin: Secondary | ICD-10-CM | POA: Diagnosis not present

## 2019-07-03 DIAGNOSIS — D689 Coagulation defect, unspecified: Secondary | ICD-10-CM | POA: Diagnosis not present

## 2019-07-03 DIAGNOSIS — Z992 Dependence on renal dialysis: Secondary | ICD-10-CM | POA: Diagnosis not present

## 2019-07-03 DIAGNOSIS — R197 Diarrhea, unspecified: Secondary | ICD-10-CM | POA: Diagnosis not present

## 2019-07-03 DIAGNOSIS — D631 Anemia in chronic kidney disease: Secondary | ICD-10-CM | POA: Diagnosis not present

## 2019-07-03 DIAGNOSIS — E876 Hypokalemia: Secondary | ICD-10-CM | POA: Diagnosis not present

## 2019-07-03 DIAGNOSIS — D509 Iron deficiency anemia, unspecified: Secondary | ICD-10-CM | POA: Diagnosis not present

## 2019-07-03 LAB — NOVEL CORONAVIRUS, NAA (HOSP ORDER, SEND-OUT TO REF LAB; TAT 18-24 HRS): SARS-CoV-2, NAA: NOT DETECTED

## 2019-07-03 NOTE — ED Provider Notes (Signed)
[place]    CSN: [id number] Arrival date & time: 07/02/19  1520      History   Chief Complaint Chief Complaint  Patient presents with  . Covid Testing    HPI Jason WOODROME Sr. is a 73 y.o. male history of hypertension, COPD, prior CVA, CHF, presenting today for Covid testing.  Patient had indirect exposure to Covid through his wife who also had indirect exposure.  His wife was around her brother who recently found out a coworker's wife tested positive.  He himself has not had any symptoms.  Denies cough, congestion or sore throat.  Denies fevers chills or body aches.  Denies loss of smell taste or appetite.  Denies nausea vomiting or diarrhea.  HPI  Past Medical History:  Diagnosis Date  . Anemia   . Arthritis   . Chronic renal insufficiency 07/31/2011  . COPD (chronic obstructive pulmonary disease) ([place])    pt denies  . CVA (cerebral vascular accident) ([place]) 06/2018  . Deficiency anemia 07/20/2012  . Diverticulosis   . Edema 09/21/2010   Qualifier: Diagnosis of  By: Jerold Coombe    . ERECTILE DYSFUNCTION, MILD 05/11/2007   Qualifier: Diagnosis of  By: Sherren Mocha MD, Jory Ee   . Exertional dyspnea 10/30/2015  . GOUT 05/11/2007   Qualifier: Diagnosis of  By: Sherren Mocha MD, Jory Ee   . Hereditary and idiopathic peripheral neuropathy 09/14/2014  . History of radiation therapy 02/21/13-03/31/13   rectum 50.4Gy total dose  . Hypersomnia 10/30/2015  . Hypertension   . Lung nodule 10/30/2015  . OAB (overactive bladder) 01/09/2016  . Obesity (BMI 30.0-34.9) 10/30/2015  . Obstructive sleep apnea 07/29/2010   HST 11/2015 AHI 54.  On autocpap  >> 10 cm   . Peripheral edema 09/19/2013  . rectal ca dx'd 02/01/13   rectal. Radiation and chemotherapy- remains on chemotherapy-next tx. 08-22-13.   . S/P partial lobectomy of lung 11/20/2015  . S/P thoracotomy   . SUI (stress urinary incontinence), male 01/09/2016    Patient Active Problem List   Diagnosis Date Noted  . 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 Gait disturbance, post-stroke 04/17/2019  . Long term (current) use of anticoagulants 12/31/2018  . Atrial fibrillation, unspecified type ([place]) 12/22/2018  . Longstanding persistent atrial fibrillation ([place]) 09/20/2018  . Functional diarrhea 07/14/2018  . Benign essential HTN   . Hyperglycemia   . ESRD on dialysis ([place]) 06/16/2018  . Mild malnutrition ([place]) 06/16/2018  . Stroke due to embolism ([place]) 06/16/2018  . CVA (cerebral vascular accident) ([place]) 06/08/2018  . CHF (congestive heart failure) ([place]) 05/08/2018  . Hyperlipidemia LDL goal <70 09/23/2017  . OAB (overactive bladder) 01/09/2016  . SUI (stress urinary incontinence), male 01/09/2016  . Hypersomnia 10/30/2015  . Obesity (BMI 30.0-34.9) 10/30/2015  . Routine general medical examination at a health care facility 06/04/2015  . Medicare annual wellness visit, subsequent 06/04/2015  . Hereditary and idiopathic peripheral neuropathy 09/14/2014  . Rectal cancer ([place]) 02/01/2013  . Obstructive sleep apnea 07/29/2010  . GOUT 05/11/2007  . ERECTILE DYSFUNCTION, MILD 05/11/2007    Past Surgical History:  Procedure Laterality Date  . A/V FISTULAGRAM Left 06/14/2018   Procedure: A/V FISTULAGRAM;  Surgeon: Waynetta Sandy, MD;  Location: [facility];  Service: Cardiovascular;  Laterality: Left;  . AV FISTULA PLACEMENT Left 03/24/2016   Procedure: CREATION OF LEFT BRACIOCEPHALIC ARTERIOVENOUS (AV) FISTULA;  Surgeon: Elam Dutch, MD;  Location: [facility];  Service:  Vascular;  Laterality: Left;  . BIOPSY  09/17/2018   Procedure: BIOPSY;  Surgeon: Doran Stabler, MD;  Location: [facility];  Service: Gastroenterology;;  . BIOPSY  03/09/2019   Procedure: BIOPSY;  Surgeon: Doran Stabler, MD;  Location: [facility];  Service: Gastroenterology;;  . COLON SURGERY  05/20/2013  . COLONOSCOPY  2010   [facility]  . COLONOSCOPY WITH PROPOFOL N/A 09/17/2018   Procedure:  COLONOSCOPY WITH PROPOFOL;  Surgeon: Doran Stabler, MD;  Location: [facility];  Service: Gastroenterology;  Laterality: N/A;  . ESOPHAGOGASTRODUODENOSCOPY (EGD) WITH PROPOFOL N/A 09/17/2018   Procedure: ESOPHAGOGASTRODUODENOSCOPY (EGD) WITH PROPOFOL;  Surgeon: Doran Stabler, MD;  [facility];  Service: Gastroenterology;  Laterality: N/A;  . ESOPHAGOGASTRODUODENOSCOPY (EGD) WITH PROPOFOL N/A 03/09/2019   Procedure: ESOPHAGOGASTRODUODENOSCOPY (EGD) WITH PROPOFOL;  Surgeon: Doran Stabler, MD;  [facility];  Service: Gastroenterology;  Laterality: N/A;  . EUS N/A 02/03/2013   Procedure: LOWER ENDOSCOPIC ULTRASOUND (EUS);  Surgeon: Milus Banister, MD;  Location: [facility];  Service: Endoscopy;  Laterality: N/A;  . ILEOSTOMY CLOSURE N/A 08/24/2013   Procedure: CLOSURE OF LOOP ILEOSTOMY ;  Surgeon: Leighton Ruff, MD;  [facility]: [facility];  Service: General;  Laterality: N/A;  . IR ANGIO VERTEBRAL SEL SUBCLAVIAN INNOMINATE BILAT MOD SED  06/08/2018  . IR CT HEAD LTD  06/08/2018  . IR INTRAVSC STENT CERV CAROTID W/O EMB-PROT MOD SED INC ANGIO  06/08/2018  . IR PERCUTANEOUS ART THROMBECTOMY/INFUSION INTRACRANIAL INC DIAG ANGIO  06/08/2018  . LAPAROSCOPIC LOW ANTERIOR RESECTION N/A 05/20/2013   Procedure: LAPAROSCOPIC LOW ANTERIOR RESECTION WITH SPLENIC FLEXURE MOBILIZATION;  Surgeon: Leighton Ruff, MD;  Location: [facility];  Service: General;  Laterality: N/A;  . OSTOMY N/A 05/20/2013   Procedure: diverting OSTOMY;  Surgeon: Leighton Ruff, MD;  Location: [facility];  Service: General;  Laterality: N/A;  . PERIPHERAL VASCULAR INTERVENTION Left 06/14/2018   Procedure: PERIPHERAL VASCULAR INTERVENTION;  Surgeon: Waynetta Sandy, MD;  Location: [facility];  Service: Cardiovascular;  Laterality: Left;  AVF on the Left  . RADIOLOGY WITH ANESTHESIA N/A 06/08/2018   Procedure: IR WITH ANESTHESIA;  Surgeon: Radiologist, Medication, MD;  Location: [facility];  Service: Radiology;   Laterality: N/A;  . VIDEO ASSISTED THORACOSCOPY (VATS)/WEDGE RESECTION Left 11/20/2015   Procedure: VIDEO ASSISTED THORACOSCOPY (VATS)/LUNG RESECTION;  Surgeon: [name], MD;  Location: [facility];  Service: Thoracic;  Laterality: Left;  Marland Kitchen VIDEO BRONCHOSCOPY N/A 11/20/2015   Procedure: VIDEO BRONCHOSCOPY;  Surgeon: [name], MD;  Location: [facility];  Service: Thoracic;  Laterality: N/A;       Home Medications    Prior to Admission medications   Medication Sig Start Date End Date Taking? Authorizing Provider  acetaminophen (TYLENOL) 325 MG tablet Take 325 mg by mouth every 4 (four) hours as needed for mild pain.  07/03/18   [provider]  allopurinol (ZYLOPRIM) 100 MG tablet Take 1 tablet by mouth once daily 11/03/18   [name], MD  atorvastatin (LIPITOR) 20 MG tablet TAKE 1 TABLET BY MOUTH  DAILY AT 6 PM. 02/09/19   [name], MD  cinacalcet (SENSIPAR) 60 MG tablet Take 60 mg by mouth daily. 05/02/19   [provider]  Eluxadoline (VIBERZI) 75 MG TABS Take 75 mg by mouth 2 (two) times daily. Patient not taking: Reported on 05/23/2019 03/11/19   Doran Stabler, MD  ferric citrate (AURYXIA) 1 GM 210 MG(Fe)  tablet Take 210 mg by mouth See admin instructions. Take 210 mg with breakfast, 210 mg with dinner, and 210 mg with a snack at bedtime    [provider]  lipase/protease/amylase (CREON) 36000 UNITS CPEP capsule Take 1 capsule (36,000 Units total) by mouth 3 (three) times daily with meals. 12/24/18   Doran Stabler, MD  metoprolol succinate (TOPROL-XL) 25 MG 24 hr tablet Take 1 tablet (25 mg total) by mouth daily. 09/20/18   [name], MD  sucroferric oxyhydroxide (VELPHORO) 500 MG chewable tablet Chew 500 mg by mouth 3 (three) times daily with meals.  03/15/19   [provider]  warfarin (COUMADIN) 5 MG tablet Take 1 tablet daily except 1/2 tablet on Mon and Fridays or TAKE AS DIRECTED BY ANTICOAGULATION CLINIC 05/06/19   [name], MD    Family History Family History  Problem Relation Age of Onset  . Stroke Mother   . Hypertension Mother   . Stroke Father   . Hypertension Father   . Hypertension Other   . Colon cancer Neg Hx     Social History Social History   Tobacco Use  . Smoking status: Former Smoker    Packs/day: 0.50    Years: 10.00    Pack years: 5.00    Types: Cigarettes    Quit date: 07/07/1977    Years since quitting: 42.0  . Smokeless tobacco: Never Used  Substance Use Topics  . Alcohol use: Not Currently    Alcohol/week: 0.0 standard drinks    Comment: drinks occasionally  . Drug use: No     Allergies   Nsaids, Lactase, Lactose intolerance (gi), Phentermine, and Amlodipine   Review of Systems Review of Systems  Constitutional: Negative for activity change, appetite change, chills, fatigue and fever.  HENT: Negative for congestion, ear pain, rhinorrhea, sinus pressure, sore throat and trouble swallowing.   Eyes: Negative for discharge and redness.  Respiratory: Negative for cough, chest tightness and shortness of breath.   Cardiovascular: Negative for chest pain.  Gastrointestinal: Negative for abdominal pain, diarrhea, nausea and vomiting.  Musculoskeletal: Negative for myalgias.  Skin: Negative for rash.  Neurological: Negative for dizziness, light-headedness and headaches.     Physical Exam Triage Vital Signs ED Triage Vitals [07/02/19 1559]  Enc Vitals Group     BP (!) 136/58     Pulse Rate 68     Resp 18     Temp 98.7 F (37.1 C)     Temp Source Oral     SpO2 100 %     Weight      Height      Head Circumference      Peak Flow      Pain Score 0     Pain Loc      Pain Edu?      Excl. in [place]?    No data found.  Updated Vital Signs BP (!) 136/58 (BP Location: Right Arm)   Pulse 68   Temp 98.7 F (37.1 C) (Oral)   Resp 18   SpO2 100%   Visual Acuity Right Eye Distance:   Left Eye Distance:   Bilateral Distance:    Right Eye Near:   Left  Eye Near:    Bilateral Near:     Physical Exam Vitals and nursing note reviewed.  Constitutional:      Appearance: He is well-developed.     Comments: No acute distress  HENT:     Head:  Normocephalic and atraumatic.     Nose: Nose normal.  Eyes:     Conjunctiva/sclera: Conjunctivae normal.  Cardiovascular:     Rate and Rhythm: Normal rate.  Pulmonary:     Effort: Pulmonary effort is normal. No respiratory distress.     Comments: Breathing comfortably at rest, CTABL, no wheezing, rales or other adventitious sounds auscultated Abdominal:     General: There is no distension.  Musculoskeletal:        General: Normal range of motion.     Cervical back: Neck supple.  Skin:    General: Skin is warm and dry.  Neurological:     Mental Status: He is alert and oriented to person, place, and time.      UC Treatments / Results  Labs (all labs ordered are listed, but only abnormal results are displayed) Labs Reviewed  NOVEL CORONAVIRUS, NAA (HOSP ORDER, SEND-OUT TO REF LAB; TAT 18-24 HRS)    EKG   Radiology No results found.  Procedures Procedures (including critical care time)  Medications Ordered in UC Medications - No data to display  Initial Impression / Assessment and Plan / UC Course  I have reviewed the triage vital signs and the nursing notes.  Pertinent labs & imaging results that were available during my care of the patient were reviewed by me and considered in my medical decision making (see chart for details).     Covid PCR pending.  Currently asymptomatic.  Recommending continue to monitor for development of symptoms, discussed possible false negative depending on timing of exposure.  Recommending retesting if developing any symptoms.  Discussed strict return precautions. Patient verbalized understanding and is agreeable with plan.  Final Clinical Impressions(s) / UC Diagnoses   Final diagnoses:  Encounter for laboratory testing for COVID-19 virus    Exposure to COVID-19 virus     Discharge Instructions        Person Under Monitoring Name: Jason SIDES Sr.  Location: [place] [address]   Infection Prevention Recommendations for Individuals Confirmed to have, or Being Evaluated for, 2019 Novel Coronavirus (COVID-19) Infection Who Receive Care at Home  Individuals who are confirmed to have, or are being evaluated for, COVID-19 should follow the prevention steps below until a healthcare provider or local or state health department says they can return to normal activities.  Stay home except to get medical care You should restrict activities outside your home, except for getting medical care. Do not go to work, school, or public areas, and do not use public transportation or taxis.  Call ahead before visiting your doctor Before your medical appointment, call the healthcare provider and tell them that you have, or are being evaluated for, COVID-19 infection. This will help the healthcare provider's office take steps to keep other people from getting infected. Ask your healthcare provider to call the local or state health department.  Monitor your symptoms Seek prompt medical attention if your illness is worsening (e.g., difficulty breathing). Before going to your medical appointment, call the healthcare provider and tell them that you have, or are being evaluated for, COVID-19 infection. Ask your healthcare provider to call the local or state health department.  Wear a facemask You should wear a facemask that covers your nose and mouth when you are in the same room with other people and when you visit a healthcare provider. People who live with or visit you should also wear a facemask while they are in the same room with you.  Separate yourself from other people in your home As much as possible, you should stay in a different room from other people in your home. Also, you should use a  separate bathroom, if available.  Avoid sharing household items You should not share dishes, drinking glasses, cups, eating utensils, towels, bedding, or other items with other people in your home. After using these items, you should wash them thoroughly with soap and water.  Cover your coughs and sneezes Cover your mouth and nose with a tissue when you cough or sneeze, or you can cough or sneeze into your sleeve. Throw used tissues in a lined trash can, and immediately wash your hands with soap and water for at least 20 seconds or use an alcohol-based hand rub.  Wash your [employer] your hands often and thoroughly with soap and water for at least 20 seconds. You can use an alcohol-based hand sanitizer if soap and water are not available and if your hands are not visibly dirty. Avoid touching your eyes, nose, and mouth with unwashed hands.   Prevention Steps for Caregivers and Household Members of Individuals Confirmed to have, or Being Evaluated for, COVID-19 Infection Being Cared for in the Home  If you live with, or provide care at home for, a person confirmed to have, or being evaluated for, COVID-19 infection please follow these guidelines to prevent infection:  Follow healthcare provider's instructions Make sure that you understand and can help the patient follow any healthcare provider instructions for all care.  Provide for the patient's basic needs You should help the patient with basic needs in the home and provide support for getting groceries, prescriptions, and other personal needs.  Monitor the patient's symptoms If they are getting sicker, call his or her medical provider and tell them that the patient has, or is being evaluated for, COVID-19 infection. This will help the healthcare provider's office take steps to keep other people from getting infected. Ask the healthcare provider to call the local or state health department.  Limit the number of people who have  contact with the patient  If possible, have only one caregiver for the patient.  Other household members should stay in another home or place of residence. If this is not possible, they should stay  in another room, or be separated from the patient as much as possible. Use a separate bathroom, if available.  Restrict visitors who do not have an essential need to be in the home.  Keep older adults, very young children, and other sick people away from the patient Keep older adults, very young children, and those who have compromised immune systems or chronic health conditions away from the patient. This includes people with chronic heart, lung, or kidney conditions, diabetes, and cancer.  Ensure good ventilation Make sure that shared spaces in the home have good air flow, such as from an air conditioner or an opened window, weather permitting.  Wash your hands often  Wash your hands often and thoroughly with soap and water for at least 20 seconds. You can use an alcohol based hand sanitizer if soap and water are not available and if your hands are not visibly dirty.  Avoid touching your eyes, nose, and mouth with unwashed hands.  Use disposable paper towels to dry your hands. If not available, use dedicated cloth towels and replace them when they become wet.  Wear a facemask and gloves  Wear a disposable facemask at all times in the room and gloves when  you touch or have contact with the patient's blood, body fluids, and/or secretions or excretions, such as sweat, saliva, sputum, nasal mucus, vomit, urine, or feces.  Ensure the mask fits over your nose and mouth tightly, and do not touch it during use.  Throw out disposable facemasks and gloves after using them. Do not reuse.  Wash your hands immediately after removing your facemask and gloves.  If your personal clothing becomes contaminated, carefully remove clothing and launder. Wash your hands after handling contaminated  clothing.  Place all used disposable facemasks, gloves, and other waste in a lined container before disposing them with other household waste.  Remove gloves and wash your hands immediately after handling these items.  Do not share dishes, glasses, or other household items with the patient  Avoid sharing household items. You should not share dishes, drinking glasses, cups, eating utensils, towels, bedding, or other items with a patient who is confirmed to have, or being evaluated for, COVID-19 infection.  After the person uses these items, you should wash them thoroughly with soap and water.  Wash laundry thoroughly  Immediately remove and wash clothes or bedding that have blood, body fluids, and/or secretions or excretions, such as sweat, saliva, sputum, nasal mucus, vomit, urine, or feces, on them.  Wear gloves when handling laundry from the patient.  Read and follow directions on labels of laundry or clothing items and detergent. In general, wash and dry with the warmest temperatures recommended on the label.  Clean all areas the individual has used often  Clean all touchable surfaces, such as counters, tabletops, doorknobs, bathroom fixtures, toilets, phones, keyboards, tablets, and bedside tables, every day. Also, clean any surfaces that may have blood, body fluids, and/or secretions or excretions on them.  Wear gloves when cleaning surfaces the patient has come in contact with.  Use a diluted bleach solution (e.g., dilute bleach with 1 part bleach and 10 parts water) or a household disinfectant with a label that says EPA-registered for coronaviruses. To make a bleach solution at home, add 1 tablespoon of bleach to 1 quart (4 cups) of water. For a larger supply, add  cup of bleach to 1 gallon (16 cups) of water.  Read labels of cleaning products and follow recommendations provided on product labels. Labels contain instructions for safe and effective use of the cleaning product  including precautions you should take when applying the product, such as wearing gloves or eye protection and making sure you have good ventilation during use of the product.  Remove gloves and wash hands immediately after cleaning.  Monitor yourself for signs and symptoms of illness Caregivers and household members are considered close contacts, should monitor their health, and will be asked to limit movement outside of the home to the extent possible. Follow the monitoring steps for close contacts listed on the symptom monitoring form.   ? If you have additional questions, contact your local health department or call the epidemiologist on call at [phone] (available 24/7). ? This guidance is subject to change. For the most up-to-date guidance from [facility], please refer to their website: YouBlogs.pl   ED Prescriptions    None     PDMP not reviewed this encounter.   [name], [place] 07/03/19 [phone]

## 2019-07-04 NOTE — Progress Notes (Signed)
SUBJECTIVE: Jason [name] is seen today follow up of painful mycotic toenails b/l feet. He also has ESRD and is on dialysis on TTS.   Today, he relates new problem of left heel pain for the past week. Patient states he has pain first step in a.m. and after periods of rest. Past treatment modalities include none.  Current Outpatient Medications on File Prior to Visit  Medication Sig Dispense Refill  . acetaminophen (TYLENOL) 325 MG tablet Take 325 mg by mouth every 4 (four) hours as needed for mild pain.     Marland Kitchen allopurinol (ZYLOPRIM) 100 MG tablet Take 1 tablet by mouth once daily 90 tablet 1  . atorvastatin (LIPITOR) 20 MG tablet TAKE 1 TABLET BY MOUTH  DAILY AT 6 PM. 90 tablet 1  . cinacalcet (SENSIPAR) 60 MG tablet Take 60 mg by mouth daily.    . Eluxadoline (VIBERZI) 75 MG TABS Take 75 mg by mouth 2 (two) times daily. (Patient not taking: Reported on 05/23/2019) 60 tablet 3  . ferric citrate (AURYXIA) 1 GM 210 MG(Fe) tablet Take 210 mg by mouth See admin instructions. Take 210 mg with breakfast, 210 mg with dinner, and 210 mg with a snack at bedtime    . lipase/protease/amylase (CREON) 36000 UNITS CPEP capsule Take 1 capsule (36,000 Units total) by mouth 3 (three) times daily with meals. 56 capsule 0  . metoprolol succinate (TOPROL-XL) 25 MG 24 hr tablet Take 1 tablet (25 mg total) by mouth daily. 90 tablet 0  . sucroferric oxyhydroxide (VELPHORO) 500 MG chewable tablet Chew 500 mg by mouth 3 (three) times daily with meals.     . warfarin (COUMADIN) 5 MG tablet Take 1 tablet daily except 1/2 tablet on Mon and Fridays or TAKE AS DIRECTED BY ANTICOAGULATION CLINIC 90 tablet 1   No current facility-administered medications on file prior to visit.    Allergies  Allergen Reactions  . Nsaids Other (See Comments)    Asked by surgeon to add this medication class as intolerance due to patients renal insufficiency.  . Lactase Other (See Comments)  . Lactose Intolerance (Gi) Diarrhea  . Phentermine  Other (See Comments)  . Amlodipine Other (See Comments)    Edema      OBJECTIVE: There were no vitals filed for this visit.  Vascular Examination: Capillary refill time to digits <3 seconds b/l.   Dorsalis pedis and posterior tibial pulses palpable bilaterally.  No digital hair b/l.   No pedal edema b/l.  No varicosities b/l.  Skin temperature gradient WNL b/l.  Dermatological: Skin thin and atrophic b/l.   No skin eruptions noted b/l.  Toenails 1-5 b/l are painful, elongated, discolored, dystrophic with subungual debris. Pain with dorsal palpation of nailplates. No erythema, no edema, no drainage noted.  Hyperkeratotic lesions dorsal 5th PIPJ b/l. No erythema, no edema, no drainage, no flocculence noted.  Nails 1-5 b/l normal b/l.  Musculoskeletal: Muscle strength 5/5 to all LE muscle groups b/l.  Negative Tinel's sign b/l.  Pain on palpation noted medial tubercle left foot.  Neurological: Epicritic sensation diminished b/l with 10 gram monofilament.  Xray findings:  No gas in tissues No evidence of fractures +Calcaneal spur HAV with bunion b/l DJD 1st MPJ  ASSESSMENT: 1. Painful onychomycosis 1-5 b/l 2. Painful plantar fasciitis left foot   3. Heel spur left foot    PLAN: 1. Patient examined today.  2. Toenails 1-5 b/l debrided in length and girth without iatrogenic laceration. 3. Xray left foot taken and reviewed  with patient in office. 4. Start stretching exercises and plantar fascial brace. Dispensed plantar fascial brace for left foot with verbal/visual instructions on application. Do not wear to bed.  5. Dispensed literature on stretching exercises. 6. Follow up 4 weeks for re-evaluation of plantar fasciitis left foot.

## 2019-07-05 DIAGNOSIS — D631 Anemia in chronic kidney disease: Secondary | ICD-10-CM | POA: Diagnosis not present

## 2019-07-05 DIAGNOSIS — N186 End stage renal disease: Secondary | ICD-10-CM | POA: Diagnosis not present

## 2019-07-05 DIAGNOSIS — Z992 Dependence on renal dialysis: Secondary | ICD-10-CM | POA: Diagnosis not present

## 2019-07-05 DIAGNOSIS — D509 Iron deficiency anemia, unspecified: Secondary | ICD-10-CM | POA: Diagnosis not present

## 2019-07-05 DIAGNOSIS — N2581 Secondary hyperparathyroidism of renal origin: Secondary | ICD-10-CM | POA: Diagnosis not present

## 2019-07-05 DIAGNOSIS — R197 Diarrhea, unspecified: Secondary | ICD-10-CM | POA: Diagnosis not present

## 2019-07-05 DIAGNOSIS — E876 Hypokalemia: Secondary | ICD-10-CM | POA: Diagnosis not present

## 2019-07-05 DIAGNOSIS — D689 Coagulation defect, unspecified: Secondary | ICD-10-CM | POA: Diagnosis not present

## 2019-07-07 DIAGNOSIS — E876 Hypokalemia: Secondary | ICD-10-CM | POA: Diagnosis not present

## 2019-07-07 DIAGNOSIS — D631 Anemia in chronic kidney disease: Secondary | ICD-10-CM | POA: Diagnosis not present

## 2019-07-07 DIAGNOSIS — R197 Diarrhea, unspecified: Secondary | ICD-10-CM | POA: Diagnosis not present

## 2019-07-07 DIAGNOSIS — D689 Coagulation defect, unspecified: Secondary | ICD-10-CM | POA: Diagnosis not present

## 2019-07-07 DIAGNOSIS — N186 End stage renal disease: Secondary | ICD-10-CM | POA: Diagnosis not present

## 2019-07-07 DIAGNOSIS — I129 Hypertensive chronic kidney disease with stage 1 through stage 4 chronic kidney disease, or unspecified chronic kidney disease: Secondary | ICD-10-CM | POA: Diagnosis not present

## 2019-07-07 DIAGNOSIS — Z992 Dependence on renal dialysis: Secondary | ICD-10-CM | POA: Diagnosis not present

## 2019-07-07 DIAGNOSIS — D509 Iron deficiency anemia, unspecified: Secondary | ICD-10-CM | POA: Diagnosis not present

## 2019-07-07 DIAGNOSIS — N2581 Secondary hyperparathyroidism of renal origin: Secondary | ICD-10-CM | POA: Diagnosis not present

## 2019-07-10 DIAGNOSIS — N2581 Secondary hyperparathyroidism of renal origin: Secondary | ICD-10-CM | POA: Diagnosis not present

## 2019-07-10 DIAGNOSIS — D509 Iron deficiency anemia, unspecified: Secondary | ICD-10-CM | POA: Diagnosis not present

## 2019-07-10 DIAGNOSIS — D689 Coagulation defect, unspecified: Secondary | ICD-10-CM | POA: Diagnosis not present

## 2019-07-10 DIAGNOSIS — R197 Diarrhea, unspecified: Secondary | ICD-10-CM | POA: Diagnosis not present

## 2019-07-10 DIAGNOSIS — E876 Hypokalemia: Secondary | ICD-10-CM | POA: Diagnosis not present

## 2019-07-10 DIAGNOSIS — Z992 Dependence on renal dialysis: Secondary | ICD-10-CM | POA: Diagnosis not present

## 2019-07-10 DIAGNOSIS — D631 Anemia in chronic kidney disease: Secondary | ICD-10-CM | POA: Diagnosis not present

## 2019-07-10 DIAGNOSIS — N186 End stage renal disease: Secondary | ICD-10-CM | POA: Diagnosis not present

## 2019-07-12 DIAGNOSIS — R197 Diarrhea, unspecified: Secondary | ICD-10-CM | POA: Diagnosis not present

## 2019-07-12 DIAGNOSIS — D509 Iron deficiency anemia, unspecified: Secondary | ICD-10-CM | POA: Diagnosis not present

## 2019-07-12 DIAGNOSIS — D631 Anemia in chronic kidney disease: Secondary | ICD-10-CM | POA: Diagnosis not present

## 2019-07-12 DIAGNOSIS — N186 End stage renal disease: Secondary | ICD-10-CM | POA: Diagnosis not present

## 2019-07-12 DIAGNOSIS — E876 Hypokalemia: Secondary | ICD-10-CM | POA: Diagnosis not present

## 2019-07-12 DIAGNOSIS — D689 Coagulation defect, unspecified: Secondary | ICD-10-CM | POA: Diagnosis not present

## 2019-07-12 DIAGNOSIS — N2581 Secondary hyperparathyroidism of renal origin: Secondary | ICD-10-CM | POA: Diagnosis not present

## 2019-07-12 DIAGNOSIS — Z992 Dependence on renal dialysis: Secondary | ICD-10-CM | POA: Diagnosis not present

## 2019-07-14 DIAGNOSIS — Z992 Dependence on renal dialysis: Secondary | ICD-10-CM | POA: Diagnosis not present

## 2019-07-14 DIAGNOSIS — N2581 Secondary hyperparathyroidism of renal origin: Secondary | ICD-10-CM | POA: Diagnosis not present

## 2019-07-14 DIAGNOSIS — D689 Coagulation defect, unspecified: Secondary | ICD-10-CM | POA: Diagnosis not present

## 2019-07-14 DIAGNOSIS — D631 Anemia in chronic kidney disease: Secondary | ICD-10-CM | POA: Diagnosis not present

## 2019-07-14 DIAGNOSIS — R197 Diarrhea, unspecified: Secondary | ICD-10-CM | POA: Diagnosis not present

## 2019-07-14 DIAGNOSIS — E876 Hypokalemia: Secondary | ICD-10-CM | POA: Diagnosis not present

## 2019-07-14 DIAGNOSIS — D509 Iron deficiency anemia, unspecified: Secondary | ICD-10-CM | POA: Diagnosis not present

## 2019-07-14 DIAGNOSIS — N186 End stage renal disease: Secondary | ICD-10-CM | POA: Diagnosis not present

## 2019-07-15 ENCOUNTER — Ambulatory Visit (INDEPENDENT_AMBULATORY_CARE_PROVIDER_SITE_OTHER): Payer: Medicare Other | Admitting: General Practice

## 2019-07-15 ENCOUNTER — Ambulatory Visit: Payer: Medicare Other

## 2019-07-15 ENCOUNTER — Other Ambulatory Visit: Payer: Self-pay

## 2019-07-15 DIAGNOSIS — Z7901 Long term (current) use of anticoagulants: Secondary | ICD-10-CM | POA: Diagnosis not present

## 2019-07-15 DIAGNOSIS — I4891 Unspecified atrial fibrillation: Secondary | ICD-10-CM

## 2019-07-15 LAB — POCT INR: INR: 2.5 (ref 2.0–3.0)

## 2019-07-15 NOTE — Progress Notes (Signed)
Medical screening examination/treatment/procedure(s) were performed by non-physician practitioner and as supervising physician I was immediately available for consultation/collaboration. I agree with above. James John, MD   

## 2019-07-15 NOTE — Patient Instructions (Addendum)
.  lbpcmh  Continue to take 1 tablet daily except 1/2 tablet on Monday Wed and Fridays.  Re-check in 4 weeks.

## 2019-07-16 DIAGNOSIS — E876 Hypokalemia: Secondary | ICD-10-CM | POA: Diagnosis not present

## 2019-07-16 DIAGNOSIS — D631 Anemia in chronic kidney disease: Secondary | ICD-10-CM | POA: Diagnosis not present

## 2019-07-16 DIAGNOSIS — N2581 Secondary hyperparathyroidism of renal origin: Secondary | ICD-10-CM | POA: Diagnosis not present

## 2019-07-16 DIAGNOSIS — Z992 Dependence on renal dialysis: Secondary | ICD-10-CM | POA: Diagnosis not present

## 2019-07-16 DIAGNOSIS — N186 End stage renal disease: Secondary | ICD-10-CM | POA: Diagnosis not present

## 2019-07-16 DIAGNOSIS — R197 Diarrhea, unspecified: Secondary | ICD-10-CM | POA: Diagnosis not present

## 2019-07-16 DIAGNOSIS — D509 Iron deficiency anemia, unspecified: Secondary | ICD-10-CM | POA: Diagnosis not present

## 2019-07-16 DIAGNOSIS — D689 Coagulation defect, unspecified: Secondary | ICD-10-CM | POA: Diagnosis not present

## 2019-07-19 DIAGNOSIS — D631 Anemia in chronic kidney disease: Secondary | ICD-10-CM | POA: Diagnosis not present

## 2019-07-19 DIAGNOSIS — R197 Diarrhea, unspecified: Secondary | ICD-10-CM | POA: Diagnosis not present

## 2019-07-19 DIAGNOSIS — Z992 Dependence on renal dialysis: Secondary | ICD-10-CM | POA: Diagnosis not present

## 2019-07-19 DIAGNOSIS — D689 Coagulation defect, unspecified: Secondary | ICD-10-CM | POA: Diagnosis not present

## 2019-07-19 DIAGNOSIS — N186 End stage renal disease: Secondary | ICD-10-CM | POA: Diagnosis not present

## 2019-07-19 DIAGNOSIS — N2581 Secondary hyperparathyroidism of renal origin: Secondary | ICD-10-CM | POA: Diagnosis not present

## 2019-07-19 DIAGNOSIS — E876 Hypokalemia: Secondary | ICD-10-CM | POA: Diagnosis not present

## 2019-07-19 DIAGNOSIS — D509 Iron deficiency anemia, unspecified: Secondary | ICD-10-CM | POA: Diagnosis not present

## 2019-07-21 DIAGNOSIS — E876 Hypokalemia: Secondary | ICD-10-CM | POA: Diagnosis not present

## 2019-07-21 DIAGNOSIS — D631 Anemia in chronic kidney disease: Secondary | ICD-10-CM | POA: Diagnosis not present

## 2019-07-21 DIAGNOSIS — Z992 Dependence on renal dialysis: Secondary | ICD-10-CM | POA: Diagnosis not present

## 2019-07-21 DIAGNOSIS — N2581 Secondary hyperparathyroidism of renal origin: Secondary | ICD-10-CM | POA: Diagnosis not present

## 2019-07-21 DIAGNOSIS — D509 Iron deficiency anemia, unspecified: Secondary | ICD-10-CM | POA: Diagnosis not present

## 2019-07-21 DIAGNOSIS — R197 Diarrhea, unspecified: Secondary | ICD-10-CM | POA: Diagnosis not present

## 2019-07-21 DIAGNOSIS — D689 Coagulation defect, unspecified: Secondary | ICD-10-CM | POA: Diagnosis not present

## 2019-07-21 DIAGNOSIS — N186 End stage renal disease: Secondary | ICD-10-CM | POA: Diagnosis not present

## 2019-07-23 DIAGNOSIS — D689 Coagulation defect, unspecified: Secondary | ICD-10-CM | POA: Diagnosis not present

## 2019-07-23 DIAGNOSIS — E876 Hypokalemia: Secondary | ICD-10-CM | POA: Diagnosis not present

## 2019-07-23 DIAGNOSIS — D509 Iron deficiency anemia, unspecified: Secondary | ICD-10-CM | POA: Diagnosis not present

## 2019-07-23 DIAGNOSIS — N2581 Secondary hyperparathyroidism of renal origin: Secondary | ICD-10-CM | POA: Diagnosis not present

## 2019-07-23 DIAGNOSIS — N186 End stage renal disease: Secondary | ICD-10-CM | POA: Diagnosis not present

## 2019-07-23 DIAGNOSIS — Z992 Dependence on renal dialysis: Secondary | ICD-10-CM | POA: Diagnosis not present

## 2019-07-23 DIAGNOSIS — D631 Anemia in chronic kidney disease: Secondary | ICD-10-CM | POA: Diagnosis not present

## 2019-07-23 DIAGNOSIS — R197 Diarrhea, unspecified: Secondary | ICD-10-CM | POA: Diagnosis not present

## 2019-07-26 DIAGNOSIS — Z992 Dependence on renal dialysis: Secondary | ICD-10-CM | POA: Diagnosis not present

## 2019-07-26 DIAGNOSIS — N186 End stage renal disease: Secondary | ICD-10-CM | POA: Diagnosis not present

## 2019-07-26 DIAGNOSIS — R197 Diarrhea, unspecified: Secondary | ICD-10-CM | POA: Diagnosis not present

## 2019-07-26 DIAGNOSIS — D631 Anemia in chronic kidney disease: Secondary | ICD-10-CM | POA: Diagnosis not present

## 2019-07-26 DIAGNOSIS — N2581 Secondary hyperparathyroidism of renal origin: Secondary | ICD-10-CM | POA: Diagnosis not present

## 2019-07-26 DIAGNOSIS — D689 Coagulation defect, unspecified: Secondary | ICD-10-CM | POA: Diagnosis not present

## 2019-07-26 DIAGNOSIS — D509 Iron deficiency anemia, unspecified: Secondary | ICD-10-CM | POA: Diagnosis not present

## 2019-07-26 DIAGNOSIS — E876 Hypokalemia: Secondary | ICD-10-CM | POA: Diagnosis not present

## 2019-07-27 ENCOUNTER — Other Ambulatory Visit: Payer: Self-pay

## 2019-07-27 ENCOUNTER — Ambulatory Visit: Payer: Medicare Other | Admitting: Podiatry

## 2019-07-27 ENCOUNTER — Encounter: Payer: Self-pay | Admitting: Podiatry

## 2019-07-27 DIAGNOSIS — M79672 Pain in left foot: Secondary | ICD-10-CM

## 2019-07-27 DIAGNOSIS — M722 Plantar fascial fibromatosis: Secondary | ICD-10-CM | POA: Diagnosis not present

## 2019-07-27 DIAGNOSIS — M7732 Calcaneal spur, left foot: Secondary | ICD-10-CM | POA: Diagnosis not present

## 2019-07-27 NOTE — Patient Instructions (Signed)

## 2019-07-28 DIAGNOSIS — Z992 Dependence on renal dialysis: Secondary | ICD-10-CM | POA: Diagnosis not present

## 2019-07-28 DIAGNOSIS — R197 Diarrhea, unspecified: Secondary | ICD-10-CM | POA: Diagnosis not present

## 2019-07-28 DIAGNOSIS — D631 Anemia in chronic kidney disease: Secondary | ICD-10-CM | POA: Diagnosis not present

## 2019-07-28 DIAGNOSIS — D689 Coagulation defect, unspecified: Secondary | ICD-10-CM | POA: Diagnosis not present

## 2019-07-28 DIAGNOSIS — E876 Hypokalemia: Secondary | ICD-10-CM | POA: Diagnosis not present

## 2019-07-28 DIAGNOSIS — D509 Iron deficiency anemia, unspecified: Secondary | ICD-10-CM | POA: Diagnosis not present

## 2019-07-28 DIAGNOSIS — N186 End stage renal disease: Secondary | ICD-10-CM | POA: Diagnosis not present

## 2019-07-28 DIAGNOSIS — N2581 Secondary hyperparathyroidism of renal origin: Secondary | ICD-10-CM | POA: Diagnosis not present

## 2019-07-30 ENCOUNTER — Ambulatory Visit: Payer: Medicare Other | Attending: Internal Medicine

## 2019-07-30 DIAGNOSIS — R197 Diarrhea, unspecified: Secondary | ICD-10-CM | POA: Diagnosis not present

## 2019-07-30 DIAGNOSIS — N2581 Secondary hyperparathyroidism of renal origin: Secondary | ICD-10-CM | POA: Diagnosis not present

## 2019-07-30 DIAGNOSIS — E876 Hypokalemia: Secondary | ICD-10-CM | POA: Diagnosis not present

## 2019-07-30 DIAGNOSIS — N186 End stage renal disease: Secondary | ICD-10-CM | POA: Diagnosis not present

## 2019-07-30 DIAGNOSIS — D631 Anemia in chronic kidney disease: Secondary | ICD-10-CM | POA: Diagnosis not present

## 2019-07-30 DIAGNOSIS — D509 Iron deficiency anemia, unspecified: Secondary | ICD-10-CM | POA: Diagnosis not present

## 2019-07-30 DIAGNOSIS — Z23 Encounter for immunization: Secondary | ICD-10-CM | POA: Insufficient documentation

## 2019-07-30 DIAGNOSIS — D689 Coagulation defect, unspecified: Secondary | ICD-10-CM | POA: Diagnosis not present

## 2019-07-30 DIAGNOSIS — Z992 Dependence on renal dialysis: Secondary | ICD-10-CM | POA: Diagnosis not present

## 2019-07-31 NOTE — Progress Notes (Signed)
SUBJECTIVE: Jason KIDD Sr. presents to clinic on today for follow up plantar fasciitis of the left heel.  Treatments on last visit include stretching exercises, plantar fascial brace . Today, pain level is 5 on scale of 1-10. He states he has been stretching and is requesting injection to heel today.   Current Outpatient Medications on File Prior to Visit  Medication Sig Dispense Refill  . Methoxy PEG-Epoetin Beta (MIRCERA IJ) Mircera    . acetaminophen (TYLENOL) 325 MG tablet Take 325 mg by mouth every 4 (four) hours as needed for mild pain.     Marland Kitchen allopurinol (ZYLOPRIM) 100 MG tablet Take 1 tablet by mouth once daily 90 tablet 1  . atorvastatin (LIPITOR) 20 MG tablet TAKE 1 TABLET BY MOUTH  DAILY AT 6 PM. 90 tablet 1  . cinacalcet (SENSIPAR) 60 MG tablet Take 60 mg by mouth daily.    . Eluxadoline (VIBERZI) 75 MG TABS Take 75 mg by mouth 2 (two) times daily. (Patient not taking: Reported on 05/23/2019) 60 tablet 3  . ferric citrate (AURYXIA) 1 GM 210 MG(Fe) tablet Take 210 mg by mouth See admin instructions. Take 210 mg with breakfast, 210 mg with dinner, and 210 mg with a snack at bedtime    . lipase/protease/amylase (CREON) 36000 UNITS CPEP capsule Take 1 capsule (36,000 Units total) by mouth 3 (three) times daily with meals. 56 capsule 0  . metoprolol succinate (TOPROL-XL) 25 MG 24 hr tablet Take 1 tablet (25 mg total) by mouth daily. 90 tablet 0  . sucroferric oxyhydroxide (VELPHORO) 500 MG chewable tablet Chew 500 mg by mouth 3 (three) times daily with meals.     . warfarin (COUMADIN) 5 MG tablet Take 1 tablet daily except 1/2 tablet on Mon and Fridays or TAKE AS DIRECTED BY ANTICOAGULATION CLINIC 90 tablet 1   No current facility-administered medications on file prior to visit.    Allergies  Allergen Reactions  . Nsaids Other (See Comments)    Asked by surgeon to add this medication class as intolerance due to patients renal insufficiency.  . Lactase Other (See Comments)  .  Lactose Intolerance (Gi) Diarrhea  . Phentermine Other (See Comments)  . Amlodipine Other (See Comments)    Edema     OBJECTIVE: There were no vitals filed for this visit.  Vascular Examination:  capillary refill time to digits immediate b/l, palpable DP pulses b/l, palpable PT pulses b/l, pedal hair absent b/l and skin temperature gradient within normal limits b/l  Dermatological Examination: Pedal skin is thin shiny, atrophic bilaterally, no open wounds bilaterally and no interdigital macerations bilaterally  Musculoskeletal: normal muscle strength 5/5 to all lower extremity muscle groups bilaterally, no pain crepitus or joint limitation noted with ROM b/l. Pain elicited when medial tubercle palpated left heel.   Neurological: Sensation diminished with 10 gram monofilament b/l  Assessment: Plantar fasciitis left foot Pain in limb Heel spur left foot  PLAN: 1. Patient examined today. 2. Patient's symptoms have had minimal improvement. 3. Continue stretching exercises and plantar fascial strap. Discussed etiology, pathology, conservative vs. surgical therapies. At this time a plantar fascial injection was recommended.  The patient agreed and a sterile skin prep was applied.  An injection consisting of a mixture of 1 cc dexamethasone 4 mg/ml,  1 cc 1% lidocaine plain, and 1 cc  0.5% marcaine plain was infiltrated at the point of maximal tenderness on the left heel.  The patient tolerated this well and was given instructions for aftercare.  4. Continue stretching exercises as instructed. 5. Follow up 5 weeks for routine foot care and follow up of plantar fasciitis left heel.

## 2019-08-01 DIAGNOSIS — R197 Diarrhea, unspecified: Secondary | ICD-10-CM | POA: Diagnosis not present

## 2019-08-01 DIAGNOSIS — N186 End stage renal disease: Secondary | ICD-10-CM | POA: Diagnosis not present

## 2019-08-01 DIAGNOSIS — D509 Iron deficiency anemia, unspecified: Secondary | ICD-10-CM | POA: Diagnosis not present

## 2019-08-01 DIAGNOSIS — Z992 Dependence on renal dialysis: Secondary | ICD-10-CM | POA: Diagnosis not present

## 2019-08-01 DIAGNOSIS — E876 Hypokalemia: Secondary | ICD-10-CM | POA: Diagnosis not present

## 2019-08-01 DIAGNOSIS — D689 Coagulation defect, unspecified: Secondary | ICD-10-CM | POA: Diagnosis not present

## 2019-08-01 DIAGNOSIS — D631 Anemia in chronic kidney disease: Secondary | ICD-10-CM | POA: Diagnosis not present

## 2019-08-01 DIAGNOSIS — N2581 Secondary hyperparathyroidism of renal origin: Secondary | ICD-10-CM | POA: Diagnosis not present

## 2019-08-02 DIAGNOSIS — D689 Coagulation defect, unspecified: Secondary | ICD-10-CM | POA: Diagnosis not present

## 2019-08-02 DIAGNOSIS — N2581 Secondary hyperparathyroidism of renal origin: Secondary | ICD-10-CM | POA: Diagnosis not present

## 2019-08-02 DIAGNOSIS — E876 Hypokalemia: Secondary | ICD-10-CM | POA: Diagnosis not present

## 2019-08-02 DIAGNOSIS — D631 Anemia in chronic kidney disease: Secondary | ICD-10-CM | POA: Diagnosis not present

## 2019-08-02 DIAGNOSIS — N186 End stage renal disease: Secondary | ICD-10-CM | POA: Diagnosis not present

## 2019-08-02 DIAGNOSIS — R197 Diarrhea, unspecified: Secondary | ICD-10-CM | POA: Diagnosis not present

## 2019-08-02 DIAGNOSIS — Z992 Dependence on renal dialysis: Secondary | ICD-10-CM | POA: Diagnosis not present

## 2019-08-02 DIAGNOSIS — D509 Iron deficiency anemia, unspecified: Secondary | ICD-10-CM | POA: Diagnosis not present

## 2019-08-04 DIAGNOSIS — Z992 Dependence on renal dialysis: Secondary | ICD-10-CM | POA: Diagnosis not present

## 2019-08-04 DIAGNOSIS — D509 Iron deficiency anemia, unspecified: Secondary | ICD-10-CM | POA: Diagnosis not present

## 2019-08-04 DIAGNOSIS — N186 End stage renal disease: Secondary | ICD-10-CM | POA: Diagnosis not present

## 2019-08-04 DIAGNOSIS — N2581 Secondary hyperparathyroidism of renal origin: Secondary | ICD-10-CM | POA: Diagnosis not present

## 2019-08-04 DIAGNOSIS — D689 Coagulation defect, unspecified: Secondary | ICD-10-CM | POA: Diagnosis not present

## 2019-08-04 DIAGNOSIS — D631 Anemia in chronic kidney disease: Secondary | ICD-10-CM | POA: Diagnosis not present

## 2019-08-04 DIAGNOSIS — R197 Diarrhea, unspecified: Secondary | ICD-10-CM | POA: Diagnosis not present

## 2019-08-04 DIAGNOSIS — E876 Hypokalemia: Secondary | ICD-10-CM | POA: Diagnosis not present

## 2019-08-06 DIAGNOSIS — N186 End stage renal disease: Secondary | ICD-10-CM | POA: Diagnosis not present

## 2019-08-06 DIAGNOSIS — D631 Anemia in chronic kidney disease: Secondary | ICD-10-CM | POA: Diagnosis not present

## 2019-08-06 DIAGNOSIS — D509 Iron deficiency anemia, unspecified: Secondary | ICD-10-CM | POA: Diagnosis not present

## 2019-08-06 DIAGNOSIS — N2581 Secondary hyperparathyroidism of renal origin: Secondary | ICD-10-CM | POA: Diagnosis not present

## 2019-08-06 DIAGNOSIS — E876 Hypokalemia: Secondary | ICD-10-CM | POA: Diagnosis not present

## 2019-08-06 DIAGNOSIS — Z992 Dependence on renal dialysis: Secondary | ICD-10-CM | POA: Diagnosis not present

## 2019-08-06 DIAGNOSIS — D689 Coagulation defect, unspecified: Secondary | ICD-10-CM | POA: Diagnosis not present

## 2019-08-06 DIAGNOSIS — R197 Diarrhea, unspecified: Secondary | ICD-10-CM | POA: Diagnosis not present

## 2019-08-07 DIAGNOSIS — Z992 Dependence on renal dialysis: Secondary | ICD-10-CM | POA: Diagnosis not present

## 2019-08-07 DIAGNOSIS — I129 Hypertensive chronic kidney disease with stage 1 through stage 4 chronic kidney disease, or unspecified chronic kidney disease: Secondary | ICD-10-CM | POA: Diagnosis not present

## 2019-08-07 DIAGNOSIS — N186 End stage renal disease: Secondary | ICD-10-CM | POA: Diagnosis not present

## 2019-08-09 DIAGNOSIS — R197 Diarrhea, unspecified: Secondary | ICD-10-CM | POA: Diagnosis not present

## 2019-08-09 DIAGNOSIS — N186 End stage renal disease: Secondary | ICD-10-CM | POA: Diagnosis not present

## 2019-08-09 DIAGNOSIS — D631 Anemia in chronic kidney disease: Secondary | ICD-10-CM | POA: Diagnosis not present

## 2019-08-09 DIAGNOSIS — N2581 Secondary hyperparathyroidism of renal origin: Secondary | ICD-10-CM | POA: Diagnosis not present

## 2019-08-09 DIAGNOSIS — D689 Coagulation defect, unspecified: Secondary | ICD-10-CM | POA: Diagnosis not present

## 2019-08-09 DIAGNOSIS — E876 Hypokalemia: Secondary | ICD-10-CM | POA: Diagnosis not present

## 2019-08-09 DIAGNOSIS — R52 Pain, unspecified: Secondary | ICD-10-CM | POA: Diagnosis not present

## 2019-08-09 DIAGNOSIS — D509 Iron deficiency anemia, unspecified: Secondary | ICD-10-CM | POA: Diagnosis not present

## 2019-08-09 DIAGNOSIS — Z992 Dependence on renal dialysis: Secondary | ICD-10-CM | POA: Diagnosis not present

## 2019-08-11 DIAGNOSIS — E876 Hypokalemia: Secondary | ICD-10-CM | POA: Diagnosis not present

## 2019-08-11 DIAGNOSIS — R52 Pain, unspecified: Secondary | ICD-10-CM | POA: Diagnosis not present

## 2019-08-11 DIAGNOSIS — D631 Anemia in chronic kidney disease: Secondary | ICD-10-CM | POA: Diagnosis not present

## 2019-08-11 DIAGNOSIS — D509 Iron deficiency anemia, unspecified: Secondary | ICD-10-CM | POA: Diagnosis not present

## 2019-08-11 DIAGNOSIS — N2581 Secondary hyperparathyroidism of renal origin: Secondary | ICD-10-CM | POA: Diagnosis not present

## 2019-08-11 DIAGNOSIS — D689 Coagulation defect, unspecified: Secondary | ICD-10-CM | POA: Diagnosis not present

## 2019-08-11 DIAGNOSIS — R197 Diarrhea, unspecified: Secondary | ICD-10-CM | POA: Diagnosis not present

## 2019-08-11 DIAGNOSIS — Z992 Dependence on renal dialysis: Secondary | ICD-10-CM | POA: Diagnosis not present

## 2019-08-11 DIAGNOSIS — N186 End stage renal disease: Secondary | ICD-10-CM | POA: Diagnosis not present

## 2019-08-12 ENCOUNTER — Other Ambulatory Visit: Payer: Self-pay

## 2019-08-12 ENCOUNTER — Ambulatory Visit (INDEPENDENT_AMBULATORY_CARE_PROVIDER_SITE_OTHER): Payer: Medicare Other | Admitting: Internal Medicine

## 2019-08-12 ENCOUNTER — Ambulatory Visit (INDEPENDENT_AMBULATORY_CARE_PROVIDER_SITE_OTHER): Payer: Medicare Other | Admitting: General Practice

## 2019-08-12 ENCOUNTER — Other Ambulatory Visit: Payer: Self-pay | Admitting: Internal Medicine

## 2019-08-12 ENCOUNTER — Encounter: Payer: Self-pay | Admitting: Internal Medicine

## 2019-08-12 VITALS — BP 130/70 | HR 64 | Temp 98.5°F | Ht 68.0 in | Wt 147.0 lb

## 2019-08-12 DIAGNOSIS — R739 Hyperglycemia, unspecified: Secondary | ICD-10-CM

## 2019-08-12 DIAGNOSIS — G5711 Meralgia paresthetica, right lower limb: Secondary | ICD-10-CM | POA: Diagnosis not present

## 2019-08-12 DIAGNOSIS — I10 Essential (primary) hypertension: Secondary | ICD-10-CM

## 2019-08-12 DIAGNOSIS — N186 End stage renal disease: Secondary | ICD-10-CM | POA: Diagnosis not present

## 2019-08-12 DIAGNOSIS — Z7901 Long term (current) use of anticoagulants: Secondary | ICD-10-CM | POA: Diagnosis not present

## 2019-08-12 DIAGNOSIS — Z992 Dependence on renal dialysis: Secondary | ICD-10-CM | POA: Diagnosis not present

## 2019-08-12 DIAGNOSIS — I4891 Unspecified atrial fibrillation: Secondary | ICD-10-CM | POA: Diagnosis not present

## 2019-08-12 LAB — POCT INR: INR: 2.1 (ref 2.0–3.0)

## 2019-08-12 MED ORDER — GABAPENTIN 100 MG PO CAPS: 100.0000 mg | ORAL_CAPSULE | Freq: Three times a day (TID) | ORAL | 5 refills | Status: DC

## 2019-08-12 NOTE — Progress Notes (Signed)
Medical screening examination/treatment/procedure(s) were performed by non-physician practitioner and as supervising physician I was immediately available for consultation/collaboration. I agree with above. Genavie Boettger, MD   

## 2019-08-12 NOTE — Patient Instructions (Signed)
Pre visit review using our clinic review tool, if applicable. No additional management support is needed unless otherwise documented below in the visit note. ° °Continue to take 1 tablet daily except 1/2 tablet on Monday Wed and Fridays.  Re-check in 6 weeks.   °

## 2019-08-12 NOTE — Patient Instructions (Signed)
Please take all new medication as prescribed - the low dose gabapentin for nerve pain  Ok to try the topical OTC Voltaren gel as needed for pain as well  Please continue all other medications as before, and refills have been done if requested.  Please have the pharmacy call with any other refills you may need.  Please keep your appointments with your specialists as you may have planned

## 2019-08-12 NOTE — Progress Notes (Signed)
Subjective:    Patient ID: Jason Cotta Sr., male    DOB: 1945/10/17, 74 y.o.   MRN: [id number]  HPI  Here with 2 wks onset burning and numbness to the right upper anterior leg only, without fever, swelling, rash, back pain and Pt denies new neurological symptoms such as new headache, or facial or extremity weakness or numbness other than above.  Pt denies chest pain, increased sob or doe, wheezing, orthopnea, PND, increased LE swelling, palpitations, dizziness or syncope.   Pt denies polydipsia, polyuria Past Medical History:  Diagnosis Date  . Anemia   . Arthritis   . Chronic renal insufficiency 07/31/2011  . COPD (chronic obstructive pulmonary disease) ([place])    pt denies  . CVA (cerebral vascular accident) ([name]) 06/2018  . Deficiency anemia 07/20/2012  . Diverticulosis   . Edema 09/21/2010   Qualifier: Diagnosis of  By: Jerold Coombe    . ERECTILE DYSFUNCTION, MILD 05/11/2007   Qualifier: Diagnosis of  By: Sherren Mocha MD, Jory Ee   . Exertional dyspnea 10/30/2015  . GOUT 05/11/2007   Qualifier: Diagnosis of  By: Sherren Mocha MD, Jory Ee   . Hereditary and idiopathic peripheral neuropathy 09/14/2014  . History of radiation therapy 02/21/13-03/31/13   rectum 50.4Gy total dose  . Hypersomnia 10/30/2015  . Hypertension   . Lung nodule 10/30/2015  . OAB (overactive bladder) 01/09/2016  . Obesity (BMI 30.0-34.9) 10/30/2015  . Obstructive sleep apnea 07/29/2010   HST 11/2015 AHI 54.  On autocpap  >> 10 cm   . Peripheral edema 09/19/2013  . rectal ca dx'd 02/01/13   rectal. Radiation and chemotherapy- remains on chemotherapy-next tx. 08-22-13.   . S/P partial lobectomy of lung 11/20/2015  . S/P thoracotomy   . SUI (stress urinary incontinence), male 01/09/2016   Past Surgical History:  Procedure Laterality Date  . A/V FISTULAGRAM Left 06/14/2018   Procedure: A/V FISTULAGRAM;  Surgeon: Waynetta Sandy, MD;  Location: [facility];  Service: Cardiovascular;  Laterality: Left;  . AV FISTULA  PLACEMENT Left 03/24/2016   Procedure: CREATION OF LEFT BRACIOCEPHALIC ARTERIOVENOUS (AV) FISTULA;  Surgeon: Elam Dutch, MD;  Location: [facility];  Service: Vascular;  Laterality: Left;  . BIOPSY  09/17/2018   Procedure: BIOPSY;  Surgeon: Doran Stabler, MD;  Location: [facility];  Service: Gastroenterology;;  . BIOPSY  03/09/2019   Procedure: BIOPSY;  Surgeon: Doran Stabler, MD;  Location: [facility];  Service: Gastroenterology;;  . COLON SURGERY  05/20/2013  . COLONOSCOPY  2010   [facility]  . COLONOSCOPY WITH PROPOFOL N/A 09/17/2018   Procedure: COLONOSCOPY WITH PROPOFOL;  Surgeon: Doran Stabler, MD;  Location: [facility];  Service: Gastroenterology;  Laterality: N/A;  . ESOPHAGOGASTRODUODENOSCOPY (EGD) WITH PROPOFOL N/A 09/17/2018   Procedure: ESOPHAGOGASTRODUODENOSCOPY (EGD) WITH PROPOFOL;  Surgeon: Doran Stabler, MD;  Location: [facility];  Service: Gastroenterology;  Laterality: N/A;  . ESOPHAGOGASTRODUODENOSCOPY (EGD) WITH PROPOFOL N/A 03/09/2019   Procedure: ESOPHAGOGASTRODUODENOSCOPY (EGD) WITH PROPOFOL;  Surgeon: Doran Stabler, MD;  [facility];  Service: Gastroenterology;  Laterality: N/A;  . EUS N/A 02/03/2013   Procedure: LOWER ENDOSCOPIC ULTRASOUND (EUS);  Surgeon: Milus Banister, MD;  Location: [facility];  Service: Endoscopy;  Laterality: N/A;  . ILEOSTOMY CLOSURE N/A 08/24/2013   Procedure: CLOSURE OF LOOP ILEOSTOMY ;  Surgeon: Leighton Ruff, MD;  Location: [facility];  Service: General;  Laterality: N/A;  . IR ANGIO VERTEBRAL SEL SUBCLAVIAN INNOMINATE BILAT  MOD SED  06/08/2018  . IR CT HEAD LTD  06/08/2018  . IR INTRAVSC STENT CERV CAROTID W/O EMB-PROT MOD SED INC ANGIO  06/08/2018  . IR PERCUTANEOUS ART THROMBECTOMY/INFUSION INTRACRANIAL INC DIAG ANGIO  06/08/2018  . LAPAROSCOPIC LOW ANTERIOR RESECTION N/A 05/20/2013   Procedure: LAPAROSCOPIC LOW ANTERIOR RESECTION WITH SPLENIC FLEXURE MOBILIZATION;  Surgeon: Leighton Ruff, MD;  Location: [facility];  Service: General;  Laterality: N/A;  . OSTOMY N/A 05/20/2013   Procedure: diverting OSTOMY;  Surgeon: Leighton Ruff, MD;  Location: [facility];  Service: General;  Laterality: N/A;  . PERIPHERAL VASCULAR INTERVENTION Left 06/14/2018   Procedure: PERIPHERAL VASCULAR INTERVENTION;  Surgeon: Waynetta Sandy, MD;  Location: [facility];  Service: Cardiovascular;  Laterality: Left;  AVF on the Left  . RADIOLOGY WITH ANESTHESIA N/A 06/08/2018   Procedure: IR WITH ANESTHESIA;  Surgeon: Radiologist, Medication, MD;  Location: [facility];  Service: Radiology;  Laterality: N/A;  . VIDEO ASSISTED THORACOSCOPY (VATS)/WEDGE RESECTION Left 11/20/2015   Procedure: VIDEO ASSISTED THORACOSCOPY (VATS)/LUNG RESECTION;  Surgeon: [name], MD;  Location: [facility];  Service: Thoracic;  Laterality: Left;  Marland Kitchen VIDEO BRONCHOSCOPY N/A 11/20/2015   Procedure: VIDEO BRONCHOSCOPY;  Surgeon: [name], MD;  Location: [facility];  Service: Thoracic;  Laterality: N/A;    reports that he quit smoking about 42 years ago. His smoking use included cigarettes. He has a 5.00 pack-year smoking history. He has never used smokeless tobacco. He reports previous alcohol use. He reports that he does not use drugs. family history includes Hypertension in his father, mother, and another family member; Stroke in his father and mother. Allergies  Allergen Reactions  . Nsaids Other (See Comments)    Asked by surgeon to add this medication class as intolerance due to patients renal insufficiency.  . Lactase Other (See Comments)  . Lactose Intolerance (Gi) Diarrhea  . Phentermine Other (See Comments)  . Amlodipine Other (See Comments)    Edema    Current Outpatient Medications on File Prior to Visit  Medication Sig Dispense Refill  . acetaminophen (TYLENOL) 325 MG tablet Take 325 mg by mouth every 4 (four) hours as needed for mild pain.     Marland Kitchen atorvastatin (LIPITOR) 20 MG tablet TAKE 1 TABLET BY MOUTH  DAILY AT 6 PM. 90  tablet 1  . cinacalcet (SENSIPAR) 60 MG tablet Take 60 mg by mouth daily.    . Eluxadoline (VIBERZI) 75 MG TABS Take 75 mg by mouth 2 (two) times daily. 60 tablet 3  . ferric citrate (AURYXIA) 1 GM 210 MG(Fe) tablet Take 210 mg by mouth See admin instructions. Take 210 mg with breakfast, 210 mg with dinner, and 210 mg with a snack at bedtime    . lipase/protease/amylase (CREON) 36000 UNITS CPEP capsule Take 1 capsule (36,000 Units total) by mouth 3 (three) times daily with meals. 56 capsule 0  . Methoxy PEG-Epoetin Beta (MIRCERA IJ) Mircera    . metoprolol succinate (TOPROL-XL) 25 MG 24 hr tablet Take 1 tablet (25 mg total) by mouth daily. 90 tablet 0  . sucroferric oxyhydroxide (VELPHORO) 500 MG chewable tablet Chew 500 mg by mouth 3 (three) times daily with meals.     . warfarin (COUMADIN) 5 MG tablet Take 1 tablet daily except 1/2 tablet on Mon and Fridays or TAKE AS DIRECTED BY ANTICOAGULATION CLINIC 90 tablet 1   No current facility-administered medications on file prior to visit.   Review of Systems All otherwise neg per  pt     Objective:   Physical Exam BP 130/70   Pulse 64   Temp 98.5 F (36.9 C)   Ht 5\' 8"  (1.727 m)   Wt 147 lb (66.7 kg)   SpO2 99%   BMI 22.35 kg/m  VS noted,  Constitutional: Pt appears in NAD HENT: Head: NCAT.  Right Ear: External ear normal.  Left Ear: External ear normal.  Eyes: . Pupils are equal, round, and reactive to light. Conjunctivae and EOM are normal Nose: without d/c or deformity Neck: Neck supple. Gross normal ROM Cardiovascular: Normal rate and regular rhythm.   Pulmonary/Chest: Effort normal and breath sounds without rales or wheezing.  Abd:  Soft, NT, ND, + BS, no organomegaly Neurological: Pt is alert. At baseline orientation, motor grossly intact Skin: Skin is warm. No rashes, other new lesions, no LE edema, has decreased sens to LT to right upper leg anterior Psychiatric: Pt behavior is normal without agitation  All otherwise neg  per pt Lab Results  Component Value Date   WBC 6.4 04/08/2019   HGB 13.2 04/08/2019   HCT 42.2 04/08/2019   PLT 179 04/08/2019   GLUCOSE 83 04/08/2019   CHOL 151 08/17/2018   TRIG 114 08/17/2018   HDL 66 08/17/2018   LDLDIRECT 120.0 06/04/2015   LDLCALC 62 08/17/2018   ALT 14 02/26/2019   AST 19 02/26/2019   NA 138 04/08/2019   K 3.7 04/08/2019   CL 98 04/08/2019   CREATININE 6.84 (H) 04/08/2019   BUN 27 (H) 04/08/2019   CO2 25 04/08/2019   TSH 1.83 10/19/2018   PSA 1.86 08/10/2013   INR 2.1 08/12/2019   HGBA1C 5.5 06/09/2018        Assessment & Plan:

## 2019-08-13 DIAGNOSIS — E876 Hypokalemia: Secondary | ICD-10-CM | POA: Diagnosis not present

## 2019-08-13 DIAGNOSIS — D631 Anemia in chronic kidney disease: Secondary | ICD-10-CM | POA: Diagnosis not present

## 2019-08-13 DIAGNOSIS — R197 Diarrhea, unspecified: Secondary | ICD-10-CM | POA: Diagnosis not present

## 2019-08-13 DIAGNOSIS — N186 End stage renal disease: Secondary | ICD-10-CM | POA: Diagnosis not present

## 2019-08-13 DIAGNOSIS — N2581 Secondary hyperparathyroidism of renal origin: Secondary | ICD-10-CM | POA: Diagnosis not present

## 2019-08-13 DIAGNOSIS — D509 Iron deficiency anemia, unspecified: Secondary | ICD-10-CM | POA: Diagnosis not present

## 2019-08-13 DIAGNOSIS — D689 Coagulation defect, unspecified: Secondary | ICD-10-CM | POA: Diagnosis not present

## 2019-08-13 DIAGNOSIS — Z992 Dependence on renal dialysis: Secondary | ICD-10-CM | POA: Diagnosis not present

## 2019-08-13 DIAGNOSIS — R52 Pain, unspecified: Secondary | ICD-10-CM | POA: Diagnosis not present

## 2019-08-14 ENCOUNTER — Encounter: Payer: Self-pay | Admitting: Internal Medicine

## 2019-08-14 NOTE — Assessment & Plan Note (Signed)
To continue HD

## 2019-08-14 NOTE — Assessment & Plan Note (Addendum)
Mild to mod symptomatic, etiology unclear but appears spontaneous, for gabapentin asd, volt gel,  Avoid tight fitting clothing or bending at the waist, to f/u any worsening symptoms or concerns  I spent 31 minutes preparing to see the patient by review of recent labs, imaging and procedures, obtaining and reviewing separately obtained history, communicating with the patient and family or caregiver, ordering medications, tests or procedures, and documenting clinical information in the EHR including the differential Dx, treatment, and any further evaluation and other management of meralgia, hyperglycemia, HTN, ESRD

## 2019-08-14 NOTE — Assessment & Plan Note (Signed)
stable overall by history and exam, recent data reviewed with pt, and pt to continue medical treatment as before,  to f/u any worsening symptoms or concerns  

## 2019-08-16 DIAGNOSIS — D689 Coagulation defect, unspecified: Secondary | ICD-10-CM | POA: Diagnosis not present

## 2019-08-16 DIAGNOSIS — N186 End stage renal disease: Secondary | ICD-10-CM | POA: Diagnosis not present

## 2019-08-16 DIAGNOSIS — D509 Iron deficiency anemia, unspecified: Secondary | ICD-10-CM | POA: Diagnosis not present

## 2019-08-16 DIAGNOSIS — N2581 Secondary hyperparathyroidism of renal origin: Secondary | ICD-10-CM | POA: Diagnosis not present

## 2019-08-16 DIAGNOSIS — R197 Diarrhea, unspecified: Secondary | ICD-10-CM | POA: Diagnosis not present

## 2019-08-16 DIAGNOSIS — E876 Hypokalemia: Secondary | ICD-10-CM | POA: Diagnosis not present

## 2019-08-16 DIAGNOSIS — D631 Anemia in chronic kidney disease: Secondary | ICD-10-CM | POA: Diagnosis not present

## 2019-08-16 DIAGNOSIS — R52 Pain, unspecified: Secondary | ICD-10-CM | POA: Diagnosis not present

## 2019-08-16 DIAGNOSIS — Z992 Dependence on renal dialysis: Secondary | ICD-10-CM | POA: Diagnosis not present

## 2019-08-18 ENCOUNTER — Other Ambulatory Visit (HOSPITAL_COMMUNITY): Payer: Self-pay | Admitting: Interventional Radiology

## 2019-08-18 DIAGNOSIS — D689 Coagulation defect, unspecified: Secondary | ICD-10-CM | POA: Diagnosis not present

## 2019-08-18 DIAGNOSIS — E876 Hypokalemia: Secondary | ICD-10-CM | POA: Diagnosis not present

## 2019-08-18 DIAGNOSIS — I639 Cerebral infarction, unspecified: Secondary | ICD-10-CM

## 2019-08-18 DIAGNOSIS — N2581 Secondary hyperparathyroidism of renal origin: Secondary | ICD-10-CM | POA: Diagnosis not present

## 2019-08-18 DIAGNOSIS — D631 Anemia in chronic kidney disease: Secondary | ICD-10-CM | POA: Diagnosis not present

## 2019-08-18 DIAGNOSIS — D509 Iron deficiency anemia, unspecified: Secondary | ICD-10-CM | POA: Diagnosis not present

## 2019-08-18 DIAGNOSIS — R197 Diarrhea, unspecified: Secondary | ICD-10-CM | POA: Diagnosis not present

## 2019-08-18 DIAGNOSIS — Z992 Dependence on renal dialysis: Secondary | ICD-10-CM | POA: Diagnosis not present

## 2019-08-18 DIAGNOSIS — N186 End stage renal disease: Secondary | ICD-10-CM | POA: Diagnosis not present

## 2019-08-18 DIAGNOSIS — R52 Pain, unspecified: Secondary | ICD-10-CM | POA: Diagnosis not present

## 2019-08-19 DIAGNOSIS — G4733 Obstructive sleep apnea (adult) (pediatric): Secondary | ICD-10-CM | POA: Diagnosis not present

## 2019-08-20 DIAGNOSIS — D509 Iron deficiency anemia, unspecified: Secondary | ICD-10-CM | POA: Diagnosis not present

## 2019-08-20 DIAGNOSIS — E876 Hypokalemia: Secondary | ICD-10-CM | POA: Diagnosis not present

## 2019-08-20 DIAGNOSIS — D631 Anemia in chronic kidney disease: Secondary | ICD-10-CM | POA: Diagnosis not present

## 2019-08-20 DIAGNOSIS — N186 End stage renal disease: Secondary | ICD-10-CM | POA: Diagnosis not present

## 2019-08-20 DIAGNOSIS — R52 Pain, unspecified: Secondary | ICD-10-CM | POA: Diagnosis not present

## 2019-08-20 DIAGNOSIS — Z992 Dependence on renal dialysis: Secondary | ICD-10-CM | POA: Diagnosis not present

## 2019-08-20 DIAGNOSIS — N2581 Secondary hyperparathyroidism of renal origin: Secondary | ICD-10-CM | POA: Diagnosis not present

## 2019-08-20 DIAGNOSIS — R197 Diarrhea, unspecified: Secondary | ICD-10-CM | POA: Diagnosis not present

## 2019-08-20 DIAGNOSIS — D689 Coagulation defect, unspecified: Secondary | ICD-10-CM | POA: Diagnosis not present

## 2019-08-23 ENCOUNTER — Other Ambulatory Visit: Payer: Self-pay | Admitting: Internal Medicine

## 2019-08-23 DIAGNOSIS — Z992 Dependence on renal dialysis: Secondary | ICD-10-CM | POA: Diagnosis not present

## 2019-08-23 DIAGNOSIS — E876 Hypokalemia: Secondary | ICD-10-CM | POA: Diagnosis not present

## 2019-08-23 DIAGNOSIS — N186 End stage renal disease: Secondary | ICD-10-CM | POA: Diagnosis not present

## 2019-08-23 DIAGNOSIS — D509 Iron deficiency anemia, unspecified: Secondary | ICD-10-CM | POA: Diagnosis not present

## 2019-08-23 DIAGNOSIS — R52 Pain, unspecified: Secondary | ICD-10-CM | POA: Diagnosis not present

## 2019-08-23 DIAGNOSIS — D689 Coagulation defect, unspecified: Secondary | ICD-10-CM | POA: Diagnosis not present

## 2019-08-23 DIAGNOSIS — R197 Diarrhea, unspecified: Secondary | ICD-10-CM | POA: Diagnosis not present

## 2019-08-23 DIAGNOSIS — N2581 Secondary hyperparathyroidism of renal origin: Secondary | ICD-10-CM | POA: Diagnosis not present

## 2019-08-23 DIAGNOSIS — D631 Anemia in chronic kidney disease: Secondary | ICD-10-CM | POA: Diagnosis not present

## 2019-08-24 DIAGNOSIS — D631 Anemia in chronic kidney disease: Secondary | ICD-10-CM | POA: Diagnosis not present

## 2019-08-24 DIAGNOSIS — D689 Coagulation defect, unspecified: Secondary | ICD-10-CM | POA: Diagnosis not present

## 2019-08-24 DIAGNOSIS — D509 Iron deficiency anemia, unspecified: Secondary | ICD-10-CM | POA: Diagnosis not present

## 2019-08-24 DIAGNOSIS — N2581 Secondary hyperparathyroidism of renal origin: Secondary | ICD-10-CM | POA: Diagnosis not present

## 2019-08-24 DIAGNOSIS — R52 Pain, unspecified: Secondary | ICD-10-CM | POA: Diagnosis not present

## 2019-08-24 DIAGNOSIS — R197 Diarrhea, unspecified: Secondary | ICD-10-CM | POA: Diagnosis not present

## 2019-08-24 DIAGNOSIS — N186 End stage renal disease: Secondary | ICD-10-CM | POA: Diagnosis not present

## 2019-08-24 DIAGNOSIS — E876 Hypokalemia: Secondary | ICD-10-CM | POA: Diagnosis not present

## 2019-08-24 DIAGNOSIS — Z992 Dependence on renal dialysis: Secondary | ICD-10-CM | POA: Diagnosis not present

## 2019-08-26 DIAGNOSIS — N186 End stage renal disease: Secondary | ICD-10-CM | POA: Diagnosis not present

## 2019-08-26 DIAGNOSIS — N2581 Secondary hyperparathyroidism of renal origin: Secondary | ICD-10-CM | POA: Diagnosis not present

## 2019-08-26 DIAGNOSIS — D509 Iron deficiency anemia, unspecified: Secondary | ICD-10-CM | POA: Diagnosis not present

## 2019-08-26 DIAGNOSIS — R52 Pain, unspecified: Secondary | ICD-10-CM | POA: Diagnosis not present

## 2019-08-26 DIAGNOSIS — E876 Hypokalemia: Secondary | ICD-10-CM | POA: Diagnosis not present

## 2019-08-26 DIAGNOSIS — D631 Anemia in chronic kidney disease: Secondary | ICD-10-CM | POA: Diagnosis not present

## 2019-08-26 DIAGNOSIS — R197 Diarrhea, unspecified: Secondary | ICD-10-CM | POA: Diagnosis not present

## 2019-08-26 DIAGNOSIS — D689 Coagulation defect, unspecified: Secondary | ICD-10-CM | POA: Diagnosis not present

## 2019-08-26 DIAGNOSIS — Z992 Dependence on renal dialysis: Secondary | ICD-10-CM | POA: Diagnosis not present

## 2019-08-27 ENCOUNTER — Ambulatory Visit: Payer: Medicare Other

## 2019-08-27 DIAGNOSIS — D689 Coagulation defect, unspecified: Secondary | ICD-10-CM | POA: Diagnosis not present

## 2019-08-27 DIAGNOSIS — D509 Iron deficiency anemia, unspecified: Secondary | ICD-10-CM | POA: Diagnosis not present

## 2019-08-27 DIAGNOSIS — E876 Hypokalemia: Secondary | ICD-10-CM | POA: Diagnosis not present

## 2019-08-27 DIAGNOSIS — Z992 Dependence on renal dialysis: Secondary | ICD-10-CM | POA: Diagnosis not present

## 2019-08-27 DIAGNOSIS — N2581 Secondary hyperparathyroidism of renal origin: Secondary | ICD-10-CM | POA: Diagnosis not present

## 2019-08-27 DIAGNOSIS — N186 End stage renal disease: Secondary | ICD-10-CM | POA: Diagnosis not present

## 2019-08-27 DIAGNOSIS — R52 Pain, unspecified: Secondary | ICD-10-CM | POA: Diagnosis not present

## 2019-08-27 DIAGNOSIS — R197 Diarrhea, unspecified: Secondary | ICD-10-CM | POA: Diagnosis not present

## 2019-08-27 DIAGNOSIS — D631 Anemia in chronic kidney disease: Secondary | ICD-10-CM | POA: Diagnosis not present

## 2019-08-29 ENCOUNTER — Encounter: Payer: Self-pay | Admitting: Nurse Practitioner

## 2019-08-29 ENCOUNTER — Other Ambulatory Visit: Payer: Self-pay

## 2019-08-29 ENCOUNTER — Inpatient Hospital Stay: Payer: Medicare Other

## 2019-08-29 ENCOUNTER — Telehealth: Payer: Self-pay

## 2019-08-29 ENCOUNTER — Inpatient Hospital Stay: Payer: Medicare Other | Attending: Nurse Practitioner | Admitting: Nurse Practitioner

## 2019-08-29 VITALS — BP 135/55 | HR 72 | Temp 98.9°F | Resp 16 | Ht 68.0 in | Wt 151.3 lb

## 2019-08-29 DIAGNOSIS — R918 Other nonspecific abnormal finding of lung field: Secondary | ICD-10-CM | POA: Diagnosis not present

## 2019-08-29 DIAGNOSIS — Z9221 Personal history of antineoplastic chemotherapy: Secondary | ICD-10-CM | POA: Diagnosis not present

## 2019-08-29 DIAGNOSIS — Z992 Dependence on renal dialysis: Secondary | ICD-10-CM | POA: Diagnosis not present

## 2019-08-29 DIAGNOSIS — Z85048 Personal history of other malignant neoplasm of rectum, rectosigmoid junction, and anus: Secondary | ICD-10-CM | POA: Insufficient documentation

## 2019-08-29 DIAGNOSIS — I10 Essential (primary) hypertension: Secondary | ICD-10-CM | POA: Insufficient documentation

## 2019-08-29 DIAGNOSIS — C20 Malignant neoplasm of rectum: Secondary | ICD-10-CM | POA: Diagnosis not present

## 2019-08-29 DIAGNOSIS — M109 Gout, unspecified: Secondary | ICD-10-CM | POA: Insufficient documentation

## 2019-08-29 DIAGNOSIS — Z8673 Personal history of transient ischemic attack (TIA), and cerebral infarction without residual deficits: Secondary | ICD-10-CM | POA: Diagnosis not present

## 2019-08-29 DIAGNOSIS — N289 Disorder of kidney and ureter, unspecified: Secondary | ICD-10-CM | POA: Insufficient documentation

## 2019-08-29 LAB — CEA (IN HOUSE-CHCC): CEA (CHCC-In House): 1.72 ng/mL (ref 0.00–5.00)

## 2019-08-29 NOTE — Telephone Encounter (Signed)
Unable to reach patient or his wife to notify them of his normal CEA level

## 2019-08-29 NOTE — Progress Notes (Signed)
[facility] OFFICE PROGRESS NOTE   Diagnosis: Rectal cancer  INTERVAL HISTORY:   Jason [name] returns as scheduled.  He feels well.  He continues dialysis on a Tuesday, Thursday, Saturday schedule.  No change in bowel habits.  He has occasional diarrhea.  No bleeding.  No abdominal pain.  No nausea or vomiting.  He has a good appetite.  Objective:  Vital signs in last 24 hours:  Blood pressure (!) 135/55, pulse 72, temperature 98.9 F (37.2 C), temperature source Temporal, resp. rate 16, height 5\' 8"  (1.727 m), weight 151 lb 4.8 oz (68.6 kg), SpO2 100 %.    HEENT: Neck without mass. Lymphatics: No palpable cervical, supraclavicular, axillary or inguinal lymph nodes. Resp: Lungs clear bilaterally. Cardio: Regular rate and rhythm. GI: Abdomen soft and nontender.  No hepatomegaly.  No mass. Vascular: No leg edema.  Lab Results:  Lab Results  Component Value Date   WBC 6.4 04/08/2019   HGB 13.2 04/08/2019   HCT 42.2 04/08/2019   MCV 97.2 04/08/2019   PLT 179 04/08/2019   NEUTROABS 4.2 04/08/2019    Imaging:  No results found.  Medications: I have reviewed the patient's current medications.  Assessment/Plan: 1. Rectal cancer, clinical stage III (uT3, uN2).  Initiation of concurrent Xeloda and radiation on 02/21/2013, completed 03/31/2013.   CEA normal 04/25/2013   Status post laparoscopic low anterior resection with diverting loop ileostomy 05/23/2013. Final pathology showed a 3.2 cm invasive adenocarcinoma with neoadjuvant related change. Tumor invaded into perirectal soft tissue. There was no lymphovascular or perineural invasion. 14 lymph nodes were negative for tumor. Surgical margins were negative. There was one hyperplastic polyp (ypT3, pN0).   Initiation of weekly 5-FU/leucovorin 08/01/2013   Ileostomy takedown 08/24/2013.   Weekly 5-FU/leucovorin resumed 09/22/2013.  Restaging CTs 02/20/2014 negative for evidence of recurrent  disease  restaging CT scans 04/05/2015 negative for evidence of recurrent disease  Surveillance colonoscopy 03/31/2014, status post removal of of a tubular adenoma from the ascending colon  Enlarging left posterior medial upper lobe nodule on chest CT 04/05/2015 and 07/12/2015  PET scan 10/22/2015 with a hypermetabolic nodule in the medialaspect of the leftupper lobe, other lung nodules stable  Left lung wedge resection 11/20/2015 confirmed a 1 cm focus of metastatic adenocarcinoma of colorectal origin, resection margins and multiple lymph nodes negative  CT scans 03/17/2016-bandlike scarring along the left upper lobe nodule resection with a soft tissue density component; calcified bilateral pleural plaques; no findings of recurrence of the abdomen or pelvis.  CTs 09/30/2016-negative for progressive disease  CTs 06/05/2017-new pulmonary nodule right upper lobe. Stable pleural nodularity within the left and right lung. Stable nodular thickening along the left upper lobe resection margin. No evidence of metastasis in the abdomen/pelvis. Interval enlargement of a solid lesion in the medial aspect of the right kidney most consistent with a benign lesion based on a previous PET scan, the "new"right upper lobe nodule was present on a PET scan 10/22/2015 and similar in size-favored to be benign  CTs 12/01/2017-no evidence of progressive rectal cancer, stable clustered right upper lobe pulmonary nodules, no new nodules  CTs 08/11/2018- no evidence of metastatic disease in the chest, abdomen or pelvis.  Circumferential bladder wall thickening with subtle perivesicular edema, new in the interval.  Index nodule right upper lobe and soft tissue fullness associated with the inferior aspect of the left upper lobe staple line are stable.  Multiple bilateral renal lesions of varying size and attenuation, some compatible simple cyst while  others are likely cyst complicated by proteinaceous debris or  hemorrhage.  Some of the intermediate attenuation lesions although stable in size cannot be fully characterized.  Avascular necrosis left femoral head.  2. Markedly elevated pretreatment CEA 3. Indeterminate pulmonary/subpleural nodules and right hepatic dome lesion. Unchanged on the CT 04/25/2013, unchanged nodular lung lesions on the CT 02/20/2014, see follow-up CTs/PET above 4. Renal insufficiency. Acute on chronic renal failure during hospitalization November/December 2014. Improved following the ileostomy takedown.He is followed by nephrology. 5. History of Anemia secondary chemotherapy, chronic disease, and renal insufficiency 6. Hypertension. 7. Gout. 8. Prolonged postoperative ileus following laparoscopic low anterior resection with diverting loop ileostomy 05/23/2013. 9. C. difficile-positive 09/08/2013-status post Flagyl with improvement. Recurrent diarrhea following completion of Flagyl. Stool positive for C. difficile on 09/24/2013. He completed treatment with vancomycin. The diarrhea resolved. 10. Left greater than right low leg edema. Venous Doppler 09/26/2013 negative for left leg DVT. 11. Rectal urgency/incontinence-Improved with participation in the pelvic physical therapy clinic. 12. CVA 06/08/2018 presenting with left-sided weakness and severely dysarthric speech.  Stroke felt to be due to large vessel atherosclerosis.   Disposition: Jason [name] remains in clinical remission from rectal cancer.  We will follow-up on the CEA from today.  We are referring him for noncontrast CT scans in the next few weeks.  He will return for a CEA and follow-up visit in 6 months.  He will contact the office in the interim with any problems.  Plan reviewed with Dr. Benay Spice.    Ned Card ANP/GNP-BC   08/29/2019  10:28 AM

## 2019-08-29 NOTE — Telephone Encounter (Signed)
-----   Message from Owens Shark, NP sent at 08/29/2019  2:31 PM EST ----- Please let him know the CEA is stable in normal range.  Follow-up as scheduled.

## 2019-08-30 ENCOUNTER — Telehealth: Payer: Self-pay | Admitting: Oncology

## 2019-08-30 DIAGNOSIS — D509 Iron deficiency anemia, unspecified: Secondary | ICD-10-CM | POA: Diagnosis not present

## 2019-08-30 DIAGNOSIS — D631 Anemia in chronic kidney disease: Secondary | ICD-10-CM | POA: Diagnosis not present

## 2019-08-30 DIAGNOSIS — R52 Pain, unspecified: Secondary | ICD-10-CM | POA: Diagnosis not present

## 2019-08-30 DIAGNOSIS — Z992 Dependence on renal dialysis: Secondary | ICD-10-CM | POA: Diagnosis not present

## 2019-08-30 DIAGNOSIS — R197 Diarrhea, unspecified: Secondary | ICD-10-CM | POA: Diagnosis not present

## 2019-08-30 DIAGNOSIS — D689 Coagulation defect, unspecified: Secondary | ICD-10-CM | POA: Diagnosis not present

## 2019-08-30 DIAGNOSIS — N2581 Secondary hyperparathyroidism of renal origin: Secondary | ICD-10-CM | POA: Diagnosis not present

## 2019-08-30 DIAGNOSIS — N186 End stage renal disease: Secondary | ICD-10-CM | POA: Diagnosis not present

## 2019-08-30 DIAGNOSIS — E876 Hypokalemia: Secondary | ICD-10-CM | POA: Diagnosis not present

## 2019-08-30 NOTE — Telephone Encounter (Signed)
Scheduled per los. Called and spoke with patients wife. Wife will let patient know to call back tomorrow if he needs to change appt. Mailed printout per request

## 2019-09-01 DIAGNOSIS — R52 Pain, unspecified: Secondary | ICD-10-CM | POA: Diagnosis not present

## 2019-09-01 DIAGNOSIS — Z992 Dependence on renal dialysis: Secondary | ICD-10-CM | POA: Diagnosis not present

## 2019-09-01 DIAGNOSIS — D689 Coagulation defect, unspecified: Secondary | ICD-10-CM | POA: Diagnosis not present

## 2019-09-01 DIAGNOSIS — E876 Hypokalemia: Secondary | ICD-10-CM | POA: Diagnosis not present

## 2019-09-01 DIAGNOSIS — D509 Iron deficiency anemia, unspecified: Secondary | ICD-10-CM | POA: Diagnosis not present

## 2019-09-01 DIAGNOSIS — N186 End stage renal disease: Secondary | ICD-10-CM | POA: Diagnosis not present

## 2019-09-01 DIAGNOSIS — R197 Diarrhea, unspecified: Secondary | ICD-10-CM | POA: Diagnosis not present

## 2019-09-01 DIAGNOSIS — D631 Anemia in chronic kidney disease: Secondary | ICD-10-CM | POA: Diagnosis not present

## 2019-09-01 DIAGNOSIS — N2581 Secondary hyperparathyroidism of renal origin: Secondary | ICD-10-CM | POA: Diagnosis not present

## 2019-09-02 ENCOUNTER — Ambulatory Visit: Payer: Medicare Other | Admitting: Podiatry

## 2019-09-02 ENCOUNTER — Telehealth: Payer: Self-pay | Admitting: *Deleted

## 2019-09-02 ENCOUNTER — Other Ambulatory Visit: Payer: Self-pay

## 2019-09-02 ENCOUNTER — Encounter: Payer: Self-pay | Admitting: Podiatry

## 2019-09-02 ENCOUNTER — Telehealth: Payer: Self-pay | Admitting: Podiatry

## 2019-09-02 VITALS — Temp 98.2°F

## 2019-09-02 DIAGNOSIS — M7732 Calcaneal spur, left foot: Secondary | ICD-10-CM

## 2019-09-02 DIAGNOSIS — M722 Plantar fascial fibromatosis: Secondary | ICD-10-CM | POA: Diagnosis not present

## 2019-09-02 DIAGNOSIS — M79672 Pain in left foot: Secondary | ICD-10-CM

## 2019-09-02 NOTE — Telephone Encounter (Signed)
Pt called stating he is still having pain in his heel and would like to know if the doctor would call him a prescription for pain medication.   Pharm[facility] is [facility] [address].

## 2019-09-02 NOTE — Telephone Encounter (Signed)
Dr. Elisha Ponder Secure Chat message requested pt be seen today as a work in.

## 2019-09-02 NOTE — Patient Instructions (Signed)

## 2019-09-03 ENCOUNTER — Ambulatory Visit: Payer: Medicare Other | Attending: Internal Medicine

## 2019-09-03 DIAGNOSIS — N2581 Secondary hyperparathyroidism of renal origin: Secondary | ICD-10-CM | POA: Diagnosis not present

## 2019-09-03 DIAGNOSIS — Z23 Encounter for immunization: Secondary | ICD-10-CM | POA: Insufficient documentation

## 2019-09-03 DIAGNOSIS — R197 Diarrhea, unspecified: Secondary | ICD-10-CM | POA: Diagnosis not present

## 2019-09-03 DIAGNOSIS — Z992 Dependence on renal dialysis: Secondary | ICD-10-CM | POA: Diagnosis not present

## 2019-09-03 DIAGNOSIS — N186 End stage renal disease: Secondary | ICD-10-CM | POA: Diagnosis not present

## 2019-09-03 DIAGNOSIS — R52 Pain, unspecified: Secondary | ICD-10-CM | POA: Diagnosis not present

## 2019-09-03 DIAGNOSIS — E876 Hypokalemia: Secondary | ICD-10-CM | POA: Diagnosis not present

## 2019-09-03 DIAGNOSIS — D509 Iron deficiency anemia, unspecified: Secondary | ICD-10-CM | POA: Diagnosis not present

## 2019-09-03 DIAGNOSIS — D689 Coagulation defect, unspecified: Secondary | ICD-10-CM | POA: Diagnosis not present

## 2019-09-03 DIAGNOSIS — D631 Anemia in chronic kidney disease: Secondary | ICD-10-CM | POA: Diagnosis not present

## 2019-09-03 NOTE — Progress Notes (Signed)
   Covid-19 Vaccination Clinic  Name:  Jason LEMMERMAN Sr.    MRN: [id number] DOB: 1945/09/17  09/03/2019  [name] was observed post Covid-19 immunization for 15 minutes without incidence. He was provided with Vaccine Information Sheet and instruction to access the V-Safe system.   [name] was instructed to call 911 with any severe reactions post vaccine: Marland Kitchen Difficulty breathing  . Swelling of your face and throat  . A fast heartbeat  . A bad rash all over your body  . Dizziness and weakness    Immunizations Administered    Name Date Dose VIS Date Route   Moderna COVID-19 Vaccine 09/03/2019 11:23 AM 0.5 mL 06/07/2019 Intramuscular   Manufacturer: Moderna   Lot: [id number]   [place]: [id number]

## 2019-09-03 NOTE — Progress Notes (Signed)
SUBJECTIVE: Jason GLORIOSO Sr. presents to clinic on today for follow up plantar fasciitis of the left heel.  Treatments on last visit include stretching exercises, plantar fascial strap and dexamethasone injection.  He states pain started again last night. Today, pain level is 7 on scale of 1-10.  Current Outpatient Medications on File Prior to Visit  Medication Sig Dispense Refill  . acetaminophen (TYLENOL) 325 MG tablet Take 325 mg by mouth every 4 (four) hours as needed for mild pain.     Marland Kitchen allopurinol (ZYLOPRIM) 100 MG tablet Take 1 tablet by mouth once daily 90 tablet 0  . atorvastatin (LIPITOR) 20 MG tablet TAKE 1 TABLET BY MOUTH  DAILY AT 6 PM. 90 tablet 1  . cinacalcet (SENSIPAR) 60 MG tablet Take 60 mg by mouth daily.    . Eluxadoline (VIBERZI) 75 MG TABS Take 75 mg by mouth 2 (two) times daily. 60 tablet 3  . ferric citrate (AURYXIA) 1 GM 210 MG(Fe) tablet Take 210 mg by mouth See admin instructions. Take 210 mg with breakfast, 210 mg with dinner, and 210 mg with a snack at bedtime    . gabapentin (NEURONTIN) 100 MG capsule Take 1 capsule (100 mg total) by mouth 3 (three) times daily. 90 capsule 5  . lipase/protease/amylase (CREON) 36000 UNITS CPEP capsule Take 1 capsule (36,000 Units total) by mouth 3 (three) times daily with meals. 56 capsule 0  . Methoxy PEG-Epoetin Beta (MIRCERA IJ) Mircera    . metoprolol succinate (TOPROL-XL) 25 MG 24 hr tablet Take 1 tablet (25 mg total) by mouth daily. 90 tablet 0  . sucroferric oxyhydroxide (VELPHORO) 500 MG chewable tablet Chew 500 mg by mouth 3 (three) times daily with meals.     . warfarin (COUMADIN) 5 MG tablet Take 1 tablet daily except 1/2 tablet on Mon and Fridays or TAKE AS DIRECTED BY ANTICOAGULATION CLINIC 90 tablet 1  . [DISCONTINUED] atorvastatin (LIPITOR) 20 MG tablet TAKE 1 TABLET BY MOUTH  DAILY AT 6 PM. 90 tablet 1   No current facility-administered medications on file prior to visit.    Allergies  Allergen Reactions  .  Nsaids Other (See Comments)    Asked by surgeon to add this medication class as intolerance due to patients renal insufficiency.  . Lactase Other (See Comments)  . Lactose Intolerance (Gi) Diarrhea  . Phentermine Other (See Comments)  . Amlodipine Other (See Comments)    Edema      OBJECTIVE: Vitals:   09/02/19 1429  Temp: 98.2 F (36.8 C)    Vascular: Capillary refill time immediate x 10 digits. Dorsalis pedis and posterior tibial pulses palpable bilaterally. No pedal edema b/l. Diminished pedal hair b/l. No varicosities b/l. Skin temperature gradient WNL b/l.  Dermatological: Pedal skin is thin, shiny and atrophic b/l. No open wounds, no interdigital macerations b/l. No skin eruptions noted b/l Nails 1-5 b/l are mycotic and recently debrided.  Musculoskeletal: Muscle strength 5/5 to all LE muscle groups b/l Negative Tinel's sign b/l Pain on palpation noted medial tubercle left heel.   Neurological: Protective sensation diminished b/l.  ASSESSMENT: 1. Plantar fasciitis left foot  2. Heel spur left foot 3. Pain in foot left foot  PLAN: 1. Patient examined today. 2. Patient's symptoms today are worse.  3. Discussed etiology, pathology, conservative vs. surgical therapies. At this time, patient opted for second local steroid plantar fascial injection  The patient agreed and a sterile skin prep was applied.  An injection consisting of mixture of  1 cc kenalog 10, 1 cc 0.5% marcaine plain and 1 cc 1% lidocaine plain was infiltrated at the point of maximal tenderness on the left heel.  The patient tolerated this well and was given instructions for aftercare.  4. Continue stretching exercises and plantar fascial strap daily. 5. Dispensed night splint to wear in bed. He was advised to avoid walking with night splint on when going to the bathroom. 6. Follow up 4 weeks.

## 2019-09-04 DIAGNOSIS — I129 Hypertensive chronic kidney disease with stage 1 through stage 4 chronic kidney disease, or unspecified chronic kidney disease: Secondary | ICD-10-CM | POA: Diagnosis not present

## 2019-09-04 DIAGNOSIS — N186 End stage renal disease: Secondary | ICD-10-CM | POA: Diagnosis not present

## 2019-09-04 DIAGNOSIS — Z992 Dependence on renal dialysis: Secondary | ICD-10-CM | POA: Diagnosis not present

## 2019-09-06 DIAGNOSIS — D509 Iron deficiency anemia, unspecified: Secondary | ICD-10-CM | POA: Diagnosis not present

## 2019-09-06 DIAGNOSIS — Z23 Encounter for immunization: Secondary | ICD-10-CM | POA: Diagnosis not present

## 2019-09-06 DIAGNOSIS — Z992 Dependence on renal dialysis: Secondary | ICD-10-CM | POA: Diagnosis not present

## 2019-09-06 DIAGNOSIS — D631 Anemia in chronic kidney disease: Secondary | ICD-10-CM | POA: Diagnosis not present

## 2019-09-06 DIAGNOSIS — R52 Pain, unspecified: Secondary | ICD-10-CM | POA: Diagnosis not present

## 2019-09-06 DIAGNOSIS — R197 Diarrhea, unspecified: Secondary | ICD-10-CM | POA: Diagnosis not present

## 2019-09-06 DIAGNOSIS — N186 End stage renal disease: Secondary | ICD-10-CM | POA: Diagnosis not present

## 2019-09-06 DIAGNOSIS — N2581 Secondary hyperparathyroidism of renal origin: Secondary | ICD-10-CM | POA: Diagnosis not present

## 2019-09-06 DIAGNOSIS — E876 Hypokalemia: Secondary | ICD-10-CM | POA: Diagnosis not present

## 2019-09-08 DIAGNOSIS — R52 Pain, unspecified: Secondary | ICD-10-CM | POA: Diagnosis not present

## 2019-09-08 DIAGNOSIS — N186 End stage renal disease: Secondary | ICD-10-CM | POA: Diagnosis not present

## 2019-09-08 DIAGNOSIS — R197 Diarrhea, unspecified: Secondary | ICD-10-CM | POA: Diagnosis not present

## 2019-09-08 DIAGNOSIS — D509 Iron deficiency anemia, unspecified: Secondary | ICD-10-CM | POA: Diagnosis not present

## 2019-09-08 DIAGNOSIS — E876 Hypokalemia: Secondary | ICD-10-CM | POA: Diagnosis not present

## 2019-09-08 DIAGNOSIS — D631 Anemia in chronic kidney disease: Secondary | ICD-10-CM | POA: Diagnosis not present

## 2019-09-08 DIAGNOSIS — Z23 Encounter for immunization: Secondary | ICD-10-CM | POA: Diagnosis not present

## 2019-09-08 DIAGNOSIS — Z992 Dependence on renal dialysis: Secondary | ICD-10-CM | POA: Diagnosis not present

## 2019-09-08 DIAGNOSIS — N2581 Secondary hyperparathyroidism of renal origin: Secondary | ICD-10-CM | POA: Diagnosis not present

## 2019-09-09 ENCOUNTER — Other Ambulatory Visit: Payer: Self-pay

## 2019-09-09 ENCOUNTER — Encounter: Payer: Self-pay | Admitting: Podiatry

## 2019-09-09 ENCOUNTER — Ambulatory Visit: Payer: Medicare Other | Admitting: Podiatry

## 2019-09-09 ENCOUNTER — Ambulatory Visit: Payer: Medicare Other | Admitting: Gastroenterology

## 2019-09-09 VITALS — Temp 98.2°F

## 2019-09-09 DIAGNOSIS — G629 Polyneuropathy, unspecified: Secondary | ICD-10-CM | POA: Diagnosis not present

## 2019-09-09 DIAGNOSIS — M722 Plantar fascial fibromatosis: Secondary | ICD-10-CM | POA: Diagnosis not present

## 2019-09-09 DIAGNOSIS — M79675 Pain in left toe(s): Secondary | ICD-10-CM

## 2019-09-09 DIAGNOSIS — L84 Corns and callosities: Secondary | ICD-10-CM | POA: Diagnosis not present

## 2019-09-09 DIAGNOSIS — B351 Tinea unguium: Secondary | ICD-10-CM

## 2019-09-09 DIAGNOSIS — M79674 Pain in right toe(s): Secondary | ICD-10-CM | POA: Diagnosis not present

## 2019-09-09 DIAGNOSIS — N186 End stage renal disease: Secondary | ICD-10-CM

## 2019-09-09 NOTE — Patient Instructions (Addendum)
Continue stretching exercises for Plantar Fasciitis Continue wearing plantar fascial brace during the day Continue wearing Night Splint at night   Plantar Fasciitis (Heel Spur Syndrome) with Rehab The plantar fascia is a fibrous, ligament-like, soft-tissue structure that spans the bottom of the foot. Plantar fasciitis is a condition that causes pain in the foot due to inflammation of the tissue. SYMPTOMS   Pain and tenderness on the underneath side of the foot.  Pain that worsens with standing or walking. CAUSES  Plantar fasciitis is caused by irritation and injury to the plantar fascia on the underneath side of the foot. Common mechanisms of injury include:  Direct trauma to bottom of the foot.  Damage to a small nerve that runs under the foot where the main fascia attaches to the heel bone.  Stress placed on the plantar fascia due to bone spurs. RISK INCREASES WITH:   Activities that place stress on the plantar fascia (running, jumping, pivoting, or cutting).  Poor strength and flexibility.  Improperly fitted shoes.  Tight calf muscles.  Flat feet.  Failure to warm-up properly before activity.  Obesity. PREVENTION  Warm up and stretch properly before activity.  Allow for adequate recovery between workouts.  Maintain physical fitness:  Strength, flexibility, and endurance.  Cardiovascular fitness.  Maintain a health body weight.  Avoid stress on the plantar fascia.  Wear properly fitted shoes, including arch supports for individuals who have flat feet.  PROGNOSIS  If treated properly, then the symptoms of plantar fasciitis usually resolve without surgery. However, occasionally surgery is necessary.  RELATED COMPLICATIONS   Recurrent symptoms that may result in a chronic condition.  Problems of the lower back that are caused by compensating for the injury, such as limping.  Pain or weakness of the foot during push-off following surgery.  Chronic  inflammation, scarring, and partial or complete fascia tear, occurring more often from repeated injections.  TREATMENT  Treatment initially involves the use of ice and medication to help reduce pain and inflammation. The use of strengthening and stretching exercises may help reduce pain with activity, especially stretches of the Achilles tendon. These exercises may be performed at home or with a therapist. Your caregiver may recommend that you use heel cups of arch supports to help reduce stress on the plantar fascia. Occasionally, corticosteroid injections are given to reduce inflammation. If symptoms persist for greater than 6 months despite non-surgical (conservative), then surgery may be recommended.   MEDICATION   If pain medication is necessary, then nonsteroidal anti-inflammatory medications, such as aspirin and ibuprofen, or other minor pain relievers, such as acetaminophen, are often recommended.  Do not take pain medication within 7 days before surgery.  Prescription pain relievers may be given if deemed necessary by your caregiver. Use only as directed and only as much as you need.  Corticosteroid injections may be given by your caregiver. These injections should be reserved for the most serious cases, because they may only be given a certain number of times.  HEAT AND COLD  Cold treatment (icing) relieves pain and reduces inflammation. Cold treatment should be applied for 10 to 15 minutes every 2 to 3 hours for inflammation and pain and immediately after any activity that aggravates your symptoms. Use ice packs or massage the area with a piece of ice (ice massage).  Heat treatment may be used prior to performing the stretching and strengthening activities prescribed by your caregiver, physical therapist, or athletic trainer. Use a heat pack or soak the injury in  warm water.  SEEK IMMEDIATE MEDICAL CARE IF:  Treatment seems to offer no benefit, or the condition worsens.  Any  medications produce adverse side effects.  EXERCISES- RANGE OF MOTION (ROM) AND STRETCHING EXERCISES - Plantar Fasciitis (Heel Spur Syndrome) These exercises may help you when beginning to rehabilitate your injury. Your symptoms may resolve with or without further involvement from your physician, physical therapist or athletic trainer. While completing these exercises, remember:   Restoring tissue flexibility helps normal motion to return to the joints. This allows healthier, less painful movement and activity.  An effective stretch should be held for at least 30 seconds.  A stretch should never be painful. You should only feel a gentle lengthening or release in the stretched tissue.  RANGE OF MOTION - Toe Extension, Flexion  Sit with your right / left leg crossed over your opposite knee.  Grasp your toes and gently pull them back toward the top of your foot. You should feel a stretch on the bottom of your toes and/or foot.  Hold this stretch for 10 seconds.  Now, gently pull your toes toward the bottom of your foot. You should feel a stretch on the top of your toes and or foot.  Hold this stretch for 10 seconds. Repeat  times. Complete this stretch 3 times per day.   RANGE OF MOTION - Ankle Dorsiflexion, Active Assisted  Remove shoes and sit on a chair that is preferably not on a carpeted surface.  Place right / left foot under knee. Extend your opposite leg for support.  Keeping your heel down, slide your right / left foot back toward the chair until you feel a stretch at your ankle or calf. If you do not feel a stretch, slide your bottom forward to the edge of the chair, while still keeping your heel down.  Hold this stretch for 10 seconds. Repeat 3 times. Complete this stretch 2 times per day.   STRETCH  Gastroc, Standing  Place hands on wall.  Extend right / left leg, keeping the front knee somewhat bent.  Slightly point your toes inward on your back foot.  Keeping  your right / left heel on the floor and your knee straight, shift your weight toward the wall, not allowing your back to arch.  You should feel a gentle stretch in the right / left calf. Hold this position for 10 seconds. Repeat 3 times. Complete this stretch 2 times per day.  STRETCH  Soleus, Standing  Place hands on wall.  Extend right / left leg, keeping the other knee somewhat bent.  Slightly point your toes inward on your back foot.  Keep your right / left heel on the floor, bend your back knee, and slightly shift your weight over the back leg so that you feel a gentle stretch deep in your back calf.  Hold this position for 10 seconds. Repeat 3 times. Complete this stretch 2 times per day.  STRETCH  Gastrocsoleus, Standing  Note: This exercise can place a lot of stress on your foot and ankle. Please complete this exercise only if specifically instructed by your caregiver.   Place the ball of your right / left foot on a step, keeping your other foot firmly on the same step.  Hold on to the wall or a rail for balance.  Slowly lift your other foot, allowing your body weight to press your heel down over the edge of the step.  You should feel a stretch in your right /  left calf.  Hold this position for 10 seconds.  Repeat this exercise with a slight bend in your right / left knee. Repeat 3 times. Complete this stretch 2 times per day.   STRENGTHENING EXERCISES - Plantar Fasciitis (Heel Spur Syndrome)  These exercises may help you when beginning to rehabilitate your injury. They may resolve your symptoms with or without further involvement from your physician, physical therapist or athletic trainer. While completing these exercises, remember:   Muscles can gain both the endurance and the strength needed for everyday activities through controlled exercises.  Complete these exercises as instructed by your physician, physical therapist or athletic trainer. Progress the resistance  and repetitions only as guided.  STRENGTH - Towel Curls  Sit in a chair positioned on a non-carpeted surface.  Place your foot on a towel, keeping your heel on the floor.  Pull the towel toward your heel by only curling your toes. Keep your heel on the floor. Repeat 3 times. Complete this exercise 2 times per day.  STRENGTH - Ankle Inversion  Secure one end of a rubber exercise band/tubing to a fixed object (table, pole). Loop the other end around your foot just before your toes.  Place your fists between your knees. This will focus your strengthening at your ankle.  Slowly, pull your big toe up and in, making sure the band/tubing is positioned to resist the entire motion.  Hold this position for 10 seconds.  Have your muscles resist the band/tubing as it slowly pulls your foot back to the starting position. Repeat 3 times. Complete this exercises 2 times per day.  Document Released: 06/23/2005 Document Revised: 09/15/2011 Document Reviewed: 10/05/2008 [facility] Patient Information 2014 [place], [place].

## 2019-09-10 DIAGNOSIS — R197 Diarrhea, unspecified: Secondary | ICD-10-CM | POA: Diagnosis not present

## 2019-09-10 DIAGNOSIS — Z23 Encounter for immunization: Secondary | ICD-10-CM | POA: Diagnosis not present

## 2019-09-10 DIAGNOSIS — D631 Anemia in chronic kidney disease: Secondary | ICD-10-CM | POA: Diagnosis not present

## 2019-09-10 DIAGNOSIS — N186 End stage renal disease: Secondary | ICD-10-CM | POA: Diagnosis not present

## 2019-09-10 DIAGNOSIS — R52 Pain, unspecified: Secondary | ICD-10-CM | POA: Diagnosis not present

## 2019-09-10 DIAGNOSIS — E876 Hypokalemia: Secondary | ICD-10-CM | POA: Diagnosis not present

## 2019-09-10 DIAGNOSIS — D509 Iron deficiency anemia, unspecified: Secondary | ICD-10-CM | POA: Diagnosis not present

## 2019-09-10 DIAGNOSIS — N2581 Secondary hyperparathyroidism of renal origin: Secondary | ICD-10-CM | POA: Diagnosis not present

## 2019-09-10 DIAGNOSIS — Z992 Dependence on renal dialysis: Secondary | ICD-10-CM | POA: Diagnosis not present

## 2019-09-11 ENCOUNTER — Other Ambulatory Visit: Payer: Self-pay | Admitting: Cardiology

## 2019-09-11 DIAGNOSIS — I4891 Unspecified atrial fibrillation: Secondary | ICD-10-CM

## 2019-09-13 DIAGNOSIS — D631 Anemia in chronic kidney disease: Secondary | ICD-10-CM | POA: Diagnosis not present

## 2019-09-13 DIAGNOSIS — Z992 Dependence on renal dialysis: Secondary | ICD-10-CM | POA: Diagnosis not present

## 2019-09-13 DIAGNOSIS — E876 Hypokalemia: Secondary | ICD-10-CM | POA: Diagnosis not present

## 2019-09-13 DIAGNOSIS — R52 Pain, unspecified: Secondary | ICD-10-CM | POA: Diagnosis not present

## 2019-09-13 DIAGNOSIS — N186 End stage renal disease: Secondary | ICD-10-CM | POA: Diagnosis not present

## 2019-09-13 DIAGNOSIS — R197 Diarrhea, unspecified: Secondary | ICD-10-CM | POA: Diagnosis not present

## 2019-09-13 DIAGNOSIS — D509 Iron deficiency anemia, unspecified: Secondary | ICD-10-CM | POA: Diagnosis not present

## 2019-09-13 DIAGNOSIS — Z23 Encounter for immunization: Secondary | ICD-10-CM | POA: Diagnosis not present

## 2019-09-13 DIAGNOSIS — N2581 Secondary hyperparathyroidism of renal origin: Secondary | ICD-10-CM | POA: Diagnosis not present

## 2019-09-14 ENCOUNTER — Encounter (HOSPITAL_COMMUNITY): Payer: Self-pay

## 2019-09-14 ENCOUNTER — Ambulatory Visit (HOSPITAL_COMMUNITY)
Admission: RE | Admit: 2019-09-14 | Discharge: 2019-09-14 | Disposition: A | Discharge status: Home / Self Care | Payer: Medicare Other | Source: Ambulatory Visit | Attending: Nurse Practitioner | Admitting: Nurse Practitioner

## 2019-09-14 ENCOUNTER — Other Ambulatory Visit: Payer: Self-pay

## 2019-09-14 DIAGNOSIS — Z5111 Encounter for antineoplastic chemotherapy: Secondary | ICD-10-CM | POA: Diagnosis not present

## 2019-09-14 DIAGNOSIS — C20 Malignant neoplasm of rectum: Secondary | ICD-10-CM

## 2019-09-14 MED ORDER — IOHEXOL 9 MG/ML PO SOLN
ORAL | Status: AC
  Filled 2019-09-14: qty 1000

## 2019-09-14 MED ORDER — IOHEXOL 9 MG/ML PO SOLN
1000.0000 mL | ORAL | Status: AC
  Administered 2019-09-14: 1000 mL via ORAL

## 2019-09-15 DIAGNOSIS — Z23 Encounter for immunization: Secondary | ICD-10-CM | POA: Diagnosis not present

## 2019-09-15 DIAGNOSIS — R197 Diarrhea, unspecified: Secondary | ICD-10-CM | POA: Diagnosis not present

## 2019-09-15 DIAGNOSIS — D509 Iron deficiency anemia, unspecified: Secondary | ICD-10-CM | POA: Diagnosis not present

## 2019-09-15 DIAGNOSIS — E876 Hypokalemia: Secondary | ICD-10-CM | POA: Diagnosis not present

## 2019-09-15 DIAGNOSIS — D631 Anemia in chronic kidney disease: Secondary | ICD-10-CM | POA: Diagnosis not present

## 2019-09-15 DIAGNOSIS — N186 End stage renal disease: Secondary | ICD-10-CM | POA: Diagnosis not present

## 2019-09-15 DIAGNOSIS — Z992 Dependence on renal dialysis: Secondary | ICD-10-CM | POA: Diagnosis not present

## 2019-09-15 DIAGNOSIS — N2581 Secondary hyperparathyroidism of renal origin: Secondary | ICD-10-CM | POA: Diagnosis not present

## 2019-09-15 DIAGNOSIS — R52 Pain, unspecified: Secondary | ICD-10-CM | POA: Diagnosis not present

## 2019-09-15 NOTE — Progress Notes (Signed)
Subjective: Jason Cotta Sr. presents today for follow up of painful mycotic nails b/l that are difficult to trim. Pain interferes with ambulation. Aggravating factors include wearing enclosed shoe gear. Pain is relieved with periodic professional debridement.   He has risk factors: ESRD on hemodialysis on TTS.  Also has h/o peripheral neuropathy.   He states his heel is still tender. Requesting another injection on today for plantar fasciitis left foot.  Allergies  Allergen Reactions  . Nsaids Other (See Comments)    Asked by surgeon to add this medication class as intolerance due to patients renal insufficiency.  . Lactase Other (See Comments)  . Lactose Intolerance (Gi) Diarrhea  . Phentermine Other (See Comments)  . Amlodipine Other (See Comments)    Edema     Objective: Vitals:   09/09/19 1509  Temp: 98.2 F (36.8 C)    Pt 74 y.o. year old AA male WD, WN in NAD. AAO x 3.   Vascular Examination:  Capillary refill time to digits immediate b/l. Palpable DP pulses b/l. Palpable PT pulses b/l. Pedal hair absent b/l Skin temperature gradient within normal limits b/l.  Dermatological Examination: Pedal skin is thin shiny, atrophic bilaterally. No open wounds bilaterally. No interdigital macerations bilaterally. Toenails 1-5 b/l elongated, dystrophic, thickened, crumbly with subungual debris and tenderness to dorsal palpation. Hyperkeratotic lesion(s) b/l 5th digit PIPJs.  No erythema, no edema, no drainage, no flocculence.  Musculoskeletal: Normal muscle strength 5/5 to all lower extremity muscle groups bilaterally, no pain crepitus or joint limitation noted with ROM b/l, hammertoes noted to the  L 5th toe and R 5th toe, pain on palpation medial tubercle left heel and patient ambulates independent of any assistive aids.  Neurological: Protective sensation diminished with 10g monofilament b/l.  Assessment: 1. Pain due to onychomycosis of toenails of both feet   2. Corns   3.  Plantar fasciitis   4. Neuropathy    Plan: -Toenails 1-5 b/l were debrided in length and girth with sterile nail nippers and dremel without iatrogenic bleeding.  -Corn(s) debrided b/l 5th digits without complication or incident. Total number debrided=2. -Counseled him on plantar fasciitis. Advised him to continue night splint, plantar fascial brace and stretching exercises for his plantar fasciitis. Will hold off on 3rd injection. Explained he can only have 3 injections in a 12 month period. He related understanding. -Patient to continue soft, supportive shoe gear daily. -Patient to report any pedal injuries to medical professional immediately. -Patient/POA to call should there be question/concern in the interim.  Return in about 9 weeks (around 11/11/2019) for nail and callus trim.

## 2019-09-16 ENCOUNTER — Telehealth: Payer: Self-pay | Admitting: *Deleted

## 2019-09-16 ENCOUNTER — Other Ambulatory Visit: Payer: Self-pay

## 2019-09-16 ENCOUNTER — Other Ambulatory Visit: Payer: Self-pay | Admitting: Cardiology

## 2019-09-16 ENCOUNTER — Ambulatory Visit (HOSPITAL_COMMUNITY)
Admission: RE | Admit: 2019-09-16 | Discharge: 2019-09-16 | Disposition: A | Discharge status: Home / Self Care | Payer: Medicare Other | Source: Ambulatory Visit | Attending: Interventional Radiology | Admitting: Interventional Radiology

## 2019-09-16 DIAGNOSIS — Z23 Encounter for immunization: Secondary | ICD-10-CM | POA: Diagnosis not present

## 2019-09-16 DIAGNOSIS — D631 Anemia in chronic kidney disease: Secondary | ICD-10-CM | POA: Diagnosis not present

## 2019-09-16 DIAGNOSIS — Z992 Dependence on renal dialysis: Secondary | ICD-10-CM | POA: Diagnosis not present

## 2019-09-16 DIAGNOSIS — R52 Pain, unspecified: Secondary | ICD-10-CM | POA: Diagnosis not present

## 2019-09-16 DIAGNOSIS — N186 End stage renal disease: Secondary | ICD-10-CM | POA: Diagnosis not present

## 2019-09-16 DIAGNOSIS — I639 Cerebral infarction, unspecified: Secondary | ICD-10-CM | POA: Diagnosis not present

## 2019-09-16 DIAGNOSIS — I4891 Unspecified atrial fibrillation: Secondary | ICD-10-CM

## 2019-09-16 DIAGNOSIS — R197 Diarrhea, unspecified: Secondary | ICD-10-CM | POA: Diagnosis not present

## 2019-09-16 DIAGNOSIS — D509 Iron deficiency anemia, unspecified: Secondary | ICD-10-CM | POA: Diagnosis not present

## 2019-09-16 DIAGNOSIS — E876 Hypokalemia: Secondary | ICD-10-CM | POA: Diagnosis not present

## 2019-09-16 DIAGNOSIS — N2581 Secondary hyperparathyroidism of renal origin: Secondary | ICD-10-CM | POA: Diagnosis not present

## 2019-09-16 NOTE — Telephone Encounter (Signed)
LM with note below 

## 2019-09-16 NOTE — Telephone Encounter (Signed)
-----   Message from Owens Shark, NP sent at 09/16/2019  8:22 AM EST ----- Please let him know CTs negative for cancer, f/u as scheduled

## 2019-09-17 DIAGNOSIS — D631 Anemia in chronic kidney disease: Secondary | ICD-10-CM | POA: Diagnosis not present

## 2019-09-17 DIAGNOSIS — R52 Pain, unspecified: Secondary | ICD-10-CM | POA: Diagnosis not present

## 2019-09-17 DIAGNOSIS — N186 End stage renal disease: Secondary | ICD-10-CM | POA: Diagnosis not present

## 2019-09-17 DIAGNOSIS — E876 Hypokalemia: Secondary | ICD-10-CM | POA: Diagnosis not present

## 2019-09-17 DIAGNOSIS — Z23 Encounter for immunization: Secondary | ICD-10-CM | POA: Diagnosis not present

## 2019-09-17 DIAGNOSIS — N2581 Secondary hyperparathyroidism of renal origin: Secondary | ICD-10-CM | POA: Diagnosis not present

## 2019-09-17 DIAGNOSIS — Z992 Dependence on renal dialysis: Secondary | ICD-10-CM | POA: Diagnosis not present

## 2019-09-17 DIAGNOSIS — R197 Diarrhea, unspecified: Secondary | ICD-10-CM | POA: Diagnosis not present

## 2019-09-17 DIAGNOSIS — D509 Iron deficiency anemia, unspecified: Secondary | ICD-10-CM | POA: Diagnosis not present

## 2019-09-18 ENCOUNTER — Other Ambulatory Visit: Payer: Self-pay | Admitting: Cardiology

## 2019-09-18 DIAGNOSIS — I4891 Unspecified atrial fibrillation: Secondary | ICD-10-CM

## 2019-09-19 ENCOUNTER — Ambulatory Visit (INDEPENDENT_AMBULATORY_CARE_PROVIDER_SITE_OTHER): Payer: Medicare Other | Admitting: General Practice

## 2019-09-19 ENCOUNTER — Other Ambulatory Visit: Payer: Self-pay | Admitting: Cardiology

## 2019-09-19 ENCOUNTER — Other Ambulatory Visit: Payer: Self-pay

## 2019-09-19 DIAGNOSIS — I4891 Unspecified atrial fibrillation: Secondary | ICD-10-CM

## 2019-09-19 DIAGNOSIS — G5711 Meralgia paresthetica, right lower limb: Secondary | ICD-10-CM

## 2019-09-19 DIAGNOSIS — Z7901 Long term (current) use of anticoagulants: Secondary | ICD-10-CM

## 2019-09-19 LAB — APTT: aPTT: 39.1 s — ABNORMAL HIGH (ref 23.4–32.7)

## 2019-09-19 LAB — PROTIME-INR
INR: 2.3 ratio — ABNORMAL HIGH (ref 0.8–1.0)
Prothrombin Time: 25.7 s — ABNORMAL HIGH (ref 9.6–13.1)

## 2019-09-19 NOTE — Patient Instructions (Addendum)
Pre visit review using our clinic review tool, if applicable. No additional management support is needed unless otherwise documented below in the visit note.  Unable to obtain INR from finger stick.  Sent pt to lab. Continue to take 1 tablet daily except 1/2 tablet on Monday Wed and Fridays.  Re-check in 6 weeks.

## 2019-09-19 NOTE — Progress Notes (Signed)
I have reviewed and agree.

## 2019-09-20 DIAGNOSIS — D509 Iron deficiency anemia, unspecified: Secondary | ICD-10-CM | POA: Diagnosis not present

## 2019-09-20 DIAGNOSIS — N2581 Secondary hyperparathyroidism of renal origin: Secondary | ICD-10-CM | POA: Diagnosis not present

## 2019-09-20 DIAGNOSIS — R197 Diarrhea, unspecified: Secondary | ICD-10-CM | POA: Diagnosis not present

## 2019-09-20 DIAGNOSIS — N186 End stage renal disease: Secondary | ICD-10-CM | POA: Diagnosis not present

## 2019-09-20 DIAGNOSIS — E876 Hypokalemia: Secondary | ICD-10-CM | POA: Diagnosis not present

## 2019-09-20 DIAGNOSIS — D631 Anemia in chronic kidney disease: Secondary | ICD-10-CM | POA: Diagnosis not present

## 2019-09-20 DIAGNOSIS — R52 Pain, unspecified: Secondary | ICD-10-CM | POA: Diagnosis not present

## 2019-09-20 DIAGNOSIS — Z992 Dependence on renal dialysis: Secondary | ICD-10-CM | POA: Diagnosis not present

## 2019-09-20 DIAGNOSIS — Z23 Encounter for immunization: Secondary | ICD-10-CM | POA: Diagnosis not present

## 2019-09-21 ENCOUNTER — Telehealth (HOSPITAL_COMMUNITY): Payer: Self-pay

## 2019-09-21 ENCOUNTER — Telehealth: Payer: Self-pay | Admitting: Internal Medicine

## 2019-09-21 ENCOUNTER — Other Ambulatory Visit: Payer: Self-pay | Admitting: Cardiology

## 2019-09-21 DIAGNOSIS — I4891 Unspecified atrial fibrillation: Secondary | ICD-10-CM

## 2019-09-21 MED ORDER — METOPROLOL SUCCINATE ER 25 MG PO TB24: 25.0000 mg | ORAL_TABLET | Freq: Every day | ORAL | 1 refills | Status: DC

## 2019-09-21 NOTE — Addendum Note (Signed)
Addended by: Karle Barr on: 09/21/2019 04:54 PM   Modules accepted: Orders

## 2019-09-21 NOTE — Telephone Encounter (Signed)
Called to schedule US carotid, no answer, no vm. AW

## 2019-09-21 NOTE — Telephone Encounter (Signed)
    1.Medication Requested: metoprolol succinate (TOPROL-XL) 25 MG 24 hr tablet  2. Pharmacy (Name, Street, [place]): [facility], [address]  3. On Med List: yes  4. Last Visit with PCP: 08/12/19  5. Next visit date with PCP:   Agent: Please be advised that RX refills may take up to 3 business days. We ask that you follow-up with your pharmacy.

## 2019-09-21 NOTE — Telephone Encounter (Signed)
erx sent as requested.  

## 2019-09-22 DIAGNOSIS — D631 Anemia in chronic kidney disease: Secondary | ICD-10-CM | POA: Diagnosis not present

## 2019-09-22 DIAGNOSIS — R52 Pain, unspecified: Secondary | ICD-10-CM | POA: Diagnosis not present

## 2019-09-22 DIAGNOSIS — E876 Hypokalemia: Secondary | ICD-10-CM | POA: Diagnosis not present

## 2019-09-22 DIAGNOSIS — Z23 Encounter for immunization: Secondary | ICD-10-CM | POA: Diagnosis not present

## 2019-09-22 DIAGNOSIS — N186 End stage renal disease: Secondary | ICD-10-CM | POA: Diagnosis not present

## 2019-09-22 DIAGNOSIS — R197 Diarrhea, unspecified: Secondary | ICD-10-CM | POA: Diagnosis not present

## 2019-09-22 DIAGNOSIS — D509 Iron deficiency anemia, unspecified: Secondary | ICD-10-CM | POA: Diagnosis not present

## 2019-09-22 DIAGNOSIS — Z992 Dependence on renal dialysis: Secondary | ICD-10-CM | POA: Diagnosis not present

## 2019-09-22 DIAGNOSIS — N2581 Secondary hyperparathyroidism of renal origin: Secondary | ICD-10-CM | POA: Diagnosis not present

## 2019-09-24 DIAGNOSIS — D509 Iron deficiency anemia, unspecified: Secondary | ICD-10-CM | POA: Diagnosis not present

## 2019-09-24 DIAGNOSIS — R197 Diarrhea, unspecified: Secondary | ICD-10-CM | POA: Diagnosis not present

## 2019-09-24 DIAGNOSIS — Z992 Dependence on renal dialysis: Secondary | ICD-10-CM | POA: Diagnosis not present

## 2019-09-24 DIAGNOSIS — Z23 Encounter for immunization: Secondary | ICD-10-CM | POA: Diagnosis not present

## 2019-09-24 DIAGNOSIS — D631 Anemia in chronic kidney disease: Secondary | ICD-10-CM | POA: Diagnosis not present

## 2019-09-24 DIAGNOSIS — R52 Pain, unspecified: Secondary | ICD-10-CM | POA: Diagnosis not present

## 2019-09-24 DIAGNOSIS — N2581 Secondary hyperparathyroidism of renal origin: Secondary | ICD-10-CM | POA: Diagnosis not present

## 2019-09-24 DIAGNOSIS — E876 Hypokalemia: Secondary | ICD-10-CM | POA: Diagnosis not present

## 2019-09-24 DIAGNOSIS — N186 End stage renal disease: Secondary | ICD-10-CM | POA: Diagnosis not present

## 2019-09-26 ENCOUNTER — Ambulatory Visit: Payer: Medicare Other | Admitting: Gastroenterology

## 2019-09-26 ENCOUNTER — Encounter: Payer: Self-pay | Admitting: Gastroenterology

## 2019-09-26 VITALS — BP 112/60 | HR 80 | Temp 99.0°F | Ht 66.5 in | Wt 156.4 lb

## 2019-09-26 DIAGNOSIS — D122 Benign neoplasm of ascending colon: Secondary | ICD-10-CM

## 2019-09-26 DIAGNOSIS — K529 Noninfective gastroenteritis and colitis, unspecified: Secondary | ICD-10-CM | POA: Diagnosis not present

## 2019-09-26 DIAGNOSIS — Z992 Dependence on renal dialysis: Secondary | ICD-10-CM

## 2019-09-26 DIAGNOSIS — Z7902 Long term (current) use of antithrombotics/antiplatelets: Secondary | ICD-10-CM

## 2019-09-26 DIAGNOSIS — Z01818 Encounter for other preprocedural examination: Secondary | ICD-10-CM

## 2019-09-26 DIAGNOSIS — N186 End stage renal disease: Secondary | ICD-10-CM

## 2019-09-26 NOTE — Patient Instructions (Signed)
If you are age 73 or older, your body mass index should be between 23-30. Your Body mass index is 24.86 kg/m. If this is out of the aforementioned range listed, please consider follow up with your Primary Care Provider.  If you are age 47 or younger, your body mass index should be between 19-25. Your Body mass index is 24.86 kg/m. If this is out of the aformentioned range listed, please consider follow up with your Primary Care Provider.   We have you scheduled for a colonoscopy on 11-22-2019 with Dr Loletha Carrow at [facility] at 60 am. We have book a nurse visit for you on 11-14-2019 @ 1pm.   It was a pleasure to see you today!  Dr. Loletha Carrow

## 2019-09-26 NOTE — Progress Notes (Signed)
Jason [name]  Chief Complaint: Colon polyp and chronic diarrhea  Subjective  History: Last seen in office August 2020 for chronic diarrhea, stable on combination of Creon and Viberzi.  See prior office notes for details of puzzling presentation, began late 2019 while patient hospitalized with multiple medical issues, including acute on chronic renal failure resulting in institution of hemodialysis.  EGD 03/09/2019 with normal biopsies of duodenum.  Colonoscopy March 2020 revealed anal stricture (most likely related to previous radiation therapy for rectal cancer), and semipedunculated descending colon polyp.  The polyp could not be removed because the patient was on Brilinta at the time after a drug-eluting coronary stent.  Brilinta has since been discontinued and the patient is on Coumadin. His weight was 151 pounds at last month's oncology follow-up appointment, which demonstrates that his weight loss has stopped. Khamis reports that he has 3-4 formed BMs per day.  He takes Imodium when he has "diarrhea" but is not entirely clear what that means.  It seems to mean that on some days it bothers him more than others and he will take some Imodium, especially on dialysis days.  He is a limited historian as before.  We were finally able to get his wife up to the office from the car, and she seems to confirm his history.  He denies rectal bleeding, his appetite is generally good, and weight has stabilized in about the last 6 months. Denies abdominal pain ROS: Cardiovascular:  no chest pain Respiratory: no dyspnea Remainder of systems negative except as above in history  The patient's Past Medical, Family and Social History were reviewed and are on file in the EMR.  Objective:  Med list reviewed  Current Outpatient Medications:  .  acetaminophen (TYLENOL) 325 MG tablet, Take 325 mg by mouth every 4 (four) hours as needed for mild pain. , Disp: , Rfl:  .  allopurinol (ZYLOPRIM)  100 MG tablet, Take 1 tablet by mouth once daily, Disp: 90 tablet, Rfl: 0 .  atorvastatin (LIPITOR) 20 MG tablet, TAKE 1 TABLET BY MOUTH  DAILY AT 6 PM., Disp: 90 tablet, Rfl: 1 .  ferric citrate (AURYXIA) 1 GM 210 MG(Fe) tablet, Take 420 mg by mouth See admin instructions. Take 420 mg with breakfast, 420 mg with dinner, and 420 mg with a snack at bedtime, Disp: , Rfl:  .  gabapentin (NEURONTIN) 100 MG capsule, Take 1 capsule (100 mg total) by mouth 3 (three) times daily., Disp: 90 capsule, Rfl: 5 .  Lanthanum Carbonate (FOSRENOL) 1000 MG PACK, Take by mouth. sprinkle on food every meal, Disp: , Rfl:  .  lipase/protease/amylase (CREON) 36000 UNITS CPEP capsule, Take 1 capsule (36,000 Units total) by mouth 3 (three) times daily with meals. (Patient taking differently: Take 36,000 Units by mouth 2 (two) times daily with a meal. And 1 with snacks), Disp: 56 capsule, Rfl: 0 .  metoprolol succinate (TOPROL-XL) 25 MG 24 hr tablet, Take 1 tablet (25 mg total) by mouth daily. Please schedule appointment for refills, Disp: 90 tablet, Rfl: 1 .  warfarin (COUMADIN) 5 MG tablet, Take 1 tablet daily except 1/2 tablet on Mon and Fridays or TAKE AS DIRECTED BY ANTICOAGULATION CLINIC, Disp: 90 tablet, Rfl: 1  General is taking 2 Creon 36,000 unit capsules with each meal.  He stopped taking Viberzi because it was upsetting his stomach.  His wife also confirms that history.  Vital signs in last 24 hrs: Vitals:   09/26/19 1406  BP:  112/60  Pulse: 80  Temp: 99 F (37.2 C)    Physical Exam Wife was present for entire visit Well-appearing.  He no longer has muscle wasting like before  HEENT: sclera anicteric, oral mucosa moist without lesions  Neck: supple, no thyromegaly, JVD or lymphadenopathy  Cardiac: RRR without murmurs, S1S2 heard, no peripheral edema  Pulm: clear to auscultation bilaterally, normal RR and effort noted  Abdomen: soft, no tenderness, with active bowel sounds. No guarding or palpable  hepatosplenomegaly.  Skin; warm and dry, no jaundice or rash  Labs:  CBC Latest Ref Rng & Units 04/08/2019 03/09/2019 02/26/2019  WBC 4.0 - 10.5 K/uL 6.4 - 6.1  Hemoglobin 13.0 - 17.0 g/dL 13.2 12.6(L) 10.3(L)  Hematocrit 39.0 - 52.0 % 42.2 37.0(L) 31.6(L)  Platelets 150 - 400 K/uL 179 - 217   Has labs at dialysis, but we do not have those records  ___________________________________________ Radiologic studies:   ____________________________________________ Other:   _____________________________________________ Assessment & Plan  Assessment: Encounter Diagnoses  Name Primary?  . Adenomatous polyp of ascending colon Yes  . Chronic diarrhea   . ESRD on dialysis ([place])   . Long term (current) use of antithrombotics/antiplatelets   . Preprocedural examination    Diarrhea has improved.  Normal form, still frequent and soon after meals.  Suspect EPI from prior alcohol abuse, then exacerbated by renal failure and starting dialysis.  Ascending colon polyp - needs repeat colonoscopy for polypectomy. Plan: Continue Creon 2 capsules with meals, 1 with snacks As needed Imodium  His colonoscopy must be done in the hospital outpatient endoscopy lab due to his medical complexity. Given my available dates there, his dialysis schedule will also need to be adjusted that week.  We will communicate with his nephrologist as that date gets closer.  He was agreeable after discussion of procedure and risks.  The benefits and risks of the planned procedure were described in detail with the patient or (when appropriate) their health care proxy.  Risks were outlined as including, but not limited to, bleeding, infection, perforation, adverse medication reaction leading to cardiac or pulmonary decompensation, pancreatitis (if ERCP).  The limitation of incomplete mucosal visualization was also discussed.  No guarantees or warranties were given. Patient at increased risk for cardiopulmonary complications of  procedure due to medical comorbidities.  Stop Coumadin 4 days prior.  He is aware of the small but real risk of thromboembolic event.  We will communicate with his cardiologist about this.  (He was able to do it for the endoscopy with me 6 months ago)  30 minutes were spent on this encounter (including chart review, history/exam, counseling/coordination of care, and documentation)  [name]

## 2019-09-27 DIAGNOSIS — Z23 Encounter for immunization: Secondary | ICD-10-CM | POA: Diagnosis not present

## 2019-09-27 DIAGNOSIS — D509 Iron deficiency anemia, unspecified: Secondary | ICD-10-CM | POA: Diagnosis not present

## 2019-09-27 DIAGNOSIS — E876 Hypokalemia: Secondary | ICD-10-CM | POA: Diagnosis not present

## 2019-09-27 DIAGNOSIS — N186 End stage renal disease: Secondary | ICD-10-CM | POA: Diagnosis not present

## 2019-09-27 DIAGNOSIS — D631 Anemia in chronic kidney disease: Secondary | ICD-10-CM | POA: Diagnosis not present

## 2019-09-27 DIAGNOSIS — R197 Diarrhea, unspecified: Secondary | ICD-10-CM | POA: Diagnosis not present

## 2019-09-27 DIAGNOSIS — N2581 Secondary hyperparathyroidism of renal origin: Secondary | ICD-10-CM | POA: Diagnosis not present

## 2019-09-27 DIAGNOSIS — Z992 Dependence on renal dialysis: Secondary | ICD-10-CM | POA: Diagnosis not present

## 2019-09-27 DIAGNOSIS — R52 Pain, unspecified: Secondary | ICD-10-CM | POA: Diagnosis not present

## 2019-09-29 DIAGNOSIS — D509 Iron deficiency anemia, unspecified: Secondary | ICD-10-CM | POA: Diagnosis not present

## 2019-09-29 DIAGNOSIS — N186 End stage renal disease: Secondary | ICD-10-CM | POA: Diagnosis not present

## 2019-09-29 DIAGNOSIS — N2581 Secondary hyperparathyroidism of renal origin: Secondary | ICD-10-CM | POA: Diagnosis not present

## 2019-09-29 DIAGNOSIS — R197 Diarrhea, unspecified: Secondary | ICD-10-CM | POA: Diagnosis not present

## 2019-09-29 DIAGNOSIS — Z23 Encounter for immunization: Secondary | ICD-10-CM | POA: Diagnosis not present

## 2019-09-29 DIAGNOSIS — Z992 Dependence on renal dialysis: Secondary | ICD-10-CM | POA: Diagnosis not present

## 2019-09-29 DIAGNOSIS — R52 Pain, unspecified: Secondary | ICD-10-CM | POA: Diagnosis not present

## 2019-09-29 DIAGNOSIS — E876 Hypokalemia: Secondary | ICD-10-CM | POA: Diagnosis not present

## 2019-09-29 DIAGNOSIS — D631 Anemia in chronic kidney disease: Secondary | ICD-10-CM | POA: Diagnosis not present

## 2019-10-01 DIAGNOSIS — N2581 Secondary hyperparathyroidism of renal origin: Secondary | ICD-10-CM | POA: Diagnosis not present

## 2019-10-01 DIAGNOSIS — R197 Diarrhea, unspecified: Secondary | ICD-10-CM | POA: Diagnosis not present

## 2019-10-01 DIAGNOSIS — E876 Hypokalemia: Secondary | ICD-10-CM | POA: Diagnosis not present

## 2019-10-01 DIAGNOSIS — R52 Pain, unspecified: Secondary | ICD-10-CM | POA: Diagnosis not present

## 2019-10-01 DIAGNOSIS — Z992 Dependence on renal dialysis: Secondary | ICD-10-CM | POA: Diagnosis not present

## 2019-10-01 DIAGNOSIS — N186 End stage renal disease: Secondary | ICD-10-CM | POA: Diagnosis not present

## 2019-10-01 DIAGNOSIS — D631 Anemia in chronic kidney disease: Secondary | ICD-10-CM | POA: Diagnosis not present

## 2019-10-01 DIAGNOSIS — Z23 Encounter for immunization: Secondary | ICD-10-CM | POA: Diagnosis not present

## 2019-10-01 DIAGNOSIS — D509 Iron deficiency anemia, unspecified: Secondary | ICD-10-CM | POA: Diagnosis not present

## 2019-10-04 ENCOUNTER — Telehealth: Payer: Self-pay

## 2019-10-04 DIAGNOSIS — E876 Hypokalemia: Secondary | ICD-10-CM | POA: Diagnosis not present

## 2019-10-04 DIAGNOSIS — D631 Anemia in chronic kidney disease: Secondary | ICD-10-CM | POA: Diagnosis not present

## 2019-10-04 DIAGNOSIS — Z23 Encounter for immunization: Secondary | ICD-10-CM | POA: Diagnosis not present

## 2019-10-04 DIAGNOSIS — R197 Diarrhea, unspecified: Secondary | ICD-10-CM | POA: Diagnosis not present

## 2019-10-04 DIAGNOSIS — D509 Iron deficiency anemia, unspecified: Secondary | ICD-10-CM | POA: Diagnosis not present

## 2019-10-04 DIAGNOSIS — Z992 Dependence on renal dialysis: Secondary | ICD-10-CM | POA: Diagnosis not present

## 2019-10-04 DIAGNOSIS — N2581 Secondary hyperparathyroidism of renal origin: Secondary | ICD-10-CM | POA: Diagnosis not present

## 2019-10-04 DIAGNOSIS — N186 End stage renal disease: Secondary | ICD-10-CM | POA: Diagnosis not present

## 2019-10-04 DIAGNOSIS — R52 Pain, unspecified: Secondary | ICD-10-CM | POA: Diagnosis not present

## 2019-10-04 NOTE — Telephone Encounter (Signed)
Patient with diagnosis of atrial fibrillation on warfarin for anticoagulation.    Procedure: colonoscopy Date of procedure: 11/22/19  CHADS2-VASc score of 5 (CHF, HTN, AGE, stroke/tia x 2)  CrCl - ESRD on dialysis Platelet count 179  Per office protocol, patient can hold warfarin for 5 days prior to procedure. Patient will need bridging with Lovenox (enoxaparin) around procedure. Since patient is on dialysis, recommend bridging with lower dose of Lovenox (1mg /kg once daily). Due to impaired renal function, recommend giving last dose of Lovenox in the morning 48 hours prior to procedure (5/16 AM).   Primary care manages patient's warfarin. Will route recommendation to them so they can manage bridging.

## 2019-10-04 NOTE — Telephone Encounter (Signed)
Appointment schedule with Richardson Dopp April 21st @ 3:45 pm

## 2019-10-04 NOTE — Telephone Encounter (Signed)
Primary Cardiologist:No primary care provider on file.  Chart reviewed as part of pre-operative protocol coverage. Because of LANDYNN DUPLER Sr.'s past medical history and time since last visit, he/she will require a follow-up visit in order to better assess preoperative cardiovascular risk.   Per pharmacy:  Patient with diagnosis of atrial fibrillation on warfarin for anticoagulation.    Procedure: colonoscopy Date of procedure: 11/22/19  CHADS2-VASc score of 5 (CHF, HTN, AGE, stroke/tia x 2)  CrCl - ESRD on dialysis Platelet count 179  Per office protocol, patient can hold warfarin for 5 days prior to procedure. Patient will need bridging with Lovenox (enoxaparin) around procedure. Since patient is on dialysis, recommend bridging with lower dose of Lovenox (1mg /kg once daily). Due to impaired renal function, recommend giving last dose of Lovenox in the morning 48 hours prior to procedure (5/16 AM).   Primary care manages patient's warfarin. Will route recommendation to them so they can manage bridging.   Pre-op covering staff: - Please schedule appointment and call patient to inform them. - Please contact requesting surgeon's office via preferred method (i.e, phone, fax) to inform them of need for appointment prior to surgery.  If applicable, this message will also be routed to pharmacy pool and/or primary cardiologist for input on holding anticoagulant/antiplatelet agent as requested below so that this information is available at time of patient's appointment.   Jory Sims, NP  10/04/2019, 9:54 AM

## 2019-10-04 NOTE — Telephone Encounter (Signed)
[facility] Pre-operative Risk Assessment     Request for surgical clearance:     Endoscopy Procedure  What type of surgery is being performed?     Colonoscopy  When is this surgery scheduled?     11-22-2019  What type of clearance is required ?   Pharmacy  Are there any medications that need to be held prior to surgery and how long? Yes, Coumadin   Practice name and name of physician performing surgery?      [facility]  What is your office phone and fax number?      Phone- [phone]  [phone]  Anesthesia type (None, local, MAC, general) ?       MAC

## 2019-10-05 DIAGNOSIS — I129 Hypertensive chronic kidney disease with stage 1 through stage 4 chronic kidney disease, or unspecified chronic kidney disease: Secondary | ICD-10-CM | POA: Diagnosis not present

## 2019-10-05 DIAGNOSIS — Z992 Dependence on renal dialysis: Secondary | ICD-10-CM | POA: Diagnosis not present

## 2019-10-05 DIAGNOSIS — N186 End stage renal disease: Secondary | ICD-10-CM | POA: Diagnosis not present

## 2019-10-06 DIAGNOSIS — Z992 Dependence on renal dialysis: Secondary | ICD-10-CM | POA: Diagnosis not present

## 2019-10-06 DIAGNOSIS — D631 Anemia in chronic kidney disease: Secondary | ICD-10-CM | POA: Diagnosis not present

## 2019-10-06 DIAGNOSIS — L299 Pruritus, unspecified: Secondary | ICD-10-CM | POA: Diagnosis not present

## 2019-10-06 DIAGNOSIS — D509 Iron deficiency anemia, unspecified: Secondary | ICD-10-CM | POA: Diagnosis not present

## 2019-10-06 DIAGNOSIS — N2581 Secondary hyperparathyroidism of renal origin: Secondary | ICD-10-CM | POA: Diagnosis not present

## 2019-10-06 DIAGNOSIS — R197 Diarrhea, unspecified: Secondary | ICD-10-CM | POA: Diagnosis not present

## 2019-10-06 DIAGNOSIS — R52 Pain, unspecified: Secondary | ICD-10-CM | POA: Diagnosis not present

## 2019-10-06 DIAGNOSIS — E876 Hypokalemia: Secondary | ICD-10-CM | POA: Diagnosis not present

## 2019-10-06 DIAGNOSIS — N186 End stage renal disease: Secondary | ICD-10-CM | POA: Diagnosis not present

## 2019-10-07 DIAGNOSIS — H401221 Low-tension glaucoma, left eye, mild stage: Secondary | ICD-10-CM | POA: Diagnosis not present

## 2019-10-07 DIAGNOSIS — H40033 Anatomical narrow angle, bilateral: Secondary | ICD-10-CM | POA: Diagnosis not present

## 2019-10-07 DIAGNOSIS — H401212 Low-tension glaucoma, right eye, moderate stage: Secondary | ICD-10-CM | POA: Diagnosis not present

## 2019-10-07 DIAGNOSIS — H02403 Unspecified ptosis of bilateral eyelids: Secondary | ICD-10-CM | POA: Diagnosis not present

## 2019-10-10 ENCOUNTER — Telehealth: Payer: Self-pay | Admitting: Gastroenterology

## 2019-10-10 NOTE — Telephone Encounter (Signed)
Spoke with pt and reviewed appointments with pt.

## 2019-10-12 ENCOUNTER — Telehealth (HOSPITAL_COMMUNITY): Payer: Self-pay

## 2019-10-12 ENCOUNTER — Other Ambulatory Visit (HOSPITAL_COMMUNITY): Payer: Self-pay | Admitting: Interventional Radiology

## 2019-10-12 DIAGNOSIS — I639 Cerebral infarction, unspecified: Secondary | ICD-10-CM

## 2019-10-12 NOTE — Telephone Encounter (Signed)
Called to schedule us carotid, no answer, left vm. AW  

## 2019-10-17 ENCOUNTER — Other Ambulatory Visit: Payer: Self-pay

## 2019-10-17 ENCOUNTER — Encounter: Payer: Self-pay | Admitting: Rehabilitation

## 2019-10-17 ENCOUNTER — Ambulatory Visit: Payer: Medicare HMO | Attending: Internal Medicine | Admitting: Rehabilitation

## 2019-10-17 ENCOUNTER — Telehealth: Payer: Self-pay | Admitting: Rehabilitation

## 2019-10-17 DIAGNOSIS — R2689 Other abnormalities of gait and mobility: Secondary | ICD-10-CM

## 2019-10-17 DIAGNOSIS — R2681 Unsteadiness on feet: Secondary | ICD-10-CM | POA: Diagnosis not present

## 2019-10-17 DIAGNOSIS — M6281 Muscle weakness (generalized): Secondary | ICD-10-CM | POA: Diagnosis not present

## 2019-10-17 DIAGNOSIS — I63413 Cerebral infarction due to embolism of bilateral middle cerebral arteries: Secondary | ICD-10-CM

## 2019-10-17 DIAGNOSIS — M21372 Foot drop, left foot: Secondary | ICD-10-CM | POA: Diagnosis not present

## 2019-10-17 DIAGNOSIS — I69354 Hemiplegia and hemiparesis following cerebral infarction affecting left non-dominant side: Secondary | ICD-10-CM

## 2019-10-17 NOTE — Telephone Encounter (Signed)
Dr. Ronnald Ramp,   I am seeing [name] Sr here at OP neuro for PT.  He has chronic L foot drop from CVA.  I feel that he would greatly benefit from AFO to reduce fall risk.  Please write order in epic work que for L AFO for L hemiplegia and we can take care of it here.    Thanks, Cameron Sprang, PT, MPT [facility] [address] [place], [place], [address] Phone: [phone]   Fax:  [phone] 10/17/19, 3:51 PM

## 2019-10-17 NOTE — Telephone Encounter (Signed)
Okay to write order for Left Ankle Foot Orthotic? I will order in epic if you approve.

## 2019-10-17 NOTE — Therapy (Signed)
[facility] [address] [place], [place], [address] Phone: [phone]   Fax:  [phone]  Physical Therapy Evaluation  Patient Details  Name: Jason BUCKNER Sr. MRN: [id number] Date of Birth: [birth year] Referring Provider (PT): [name], MD   Encounter Date: 10/17/2019  PT End of Session - 10/17/19 1356    Visit Number  1    Number of Visits  17    Date for PT Re-Evaluation  12/16/19    PT Start Time  1315    PT Stop Time  1355    PT Time Calculation (min)  40 min    Activity Tolerance  Patient tolerated treatment well    Behavior During Therapy  [facility] for tasks assessed/performed       Past Medical History:  Diagnosis Date  . Anemia   . Arthritis   . Chronic renal insufficiency 07/31/2011  . COPD (chronic obstructive pulmonary disease) ([place])    pt denies  . CVA (cerebral vascular accident) ([place]) 06/2018  . Deficiency anemia 07/20/2012  . Diverticulosis   . Edema 09/21/2010   Qualifier: Diagnosis of  By: Jerold Coombe    . ERECTILE DYSFUNCTION, MILD 05/11/2007   Qualifier: Diagnosis of  By: Sherren Mocha MD, Jory Ee   . Exertional dyspnea 10/30/2015  . GOUT 05/11/2007   Qualifier: Diagnosis of  By: Sherren Mocha MD, Jory Ee   . Hereditary and idiopathic peripheral neuropathy 09/14/2014  . History of radiation therapy 02/21/13-03/31/13   rectum 50.4Gy total dose  . Hypersomnia 10/30/2015  . Hypertension   . Lung nodule 10/30/2015  . OAB (overactive bladder) 01/09/2016  . Obesity (BMI 30.0-34.9) 10/30/2015  . Obstructive sleep apnea 07/29/2010   HST 11/2015 AHI 54.  On autocpap  >> 10 cm   . Peripheral edema 09/19/2013  . rectal ca dx'd 02/01/13   rectal. Radiation and chemotherapy- remains on chemotherapy-next tx. 08-22-13.   . S/P partial lobectomy of lung 11/20/2015  . S/P thoracotomy   . SUI (stress urinary incontinence), male 01/09/2016    Past Surgical History:  Procedure Laterality Date  . A/V FISTULAGRAM Left  06/14/2018   Procedure: A/V FISTULAGRAM;  Surgeon: Waynetta Sandy, MD;  Location: [facility];  Service: Cardiovascular;  Laterality: Left;  . AV FISTULA PLACEMENT Left 03/24/2016   Procedure: CREATION OF LEFT BRACIOCEPHALIC ARTERIOVENOUS (AV) FISTULA;  Surgeon: Elam Dutch, MD;  Location: [facility];  Service: Vascular;  Laterality: Left;  . BIOPSY  09/17/2018   Procedure: BIOPSY;  Surgeon: Doran Stabler, MD;  Location: [facility];  Service: Gastroenterology;;  . BIOPSY  03/09/2019   Procedure: BIOPSY;  Surgeon: Doran Stabler, MD;  Location: [facility];  Service: Gastroenterology;;  . COLON SURGERY  05/20/2013  . COLONOSCOPY  2010   [facility]  . COLONOSCOPY WITH PROPOFOL N/A 09/17/2018   Procedure: COLONOSCOPY WITH PROPOFOL;  Surgeon: Doran Stabler, MD;  Location: [facility];  Service: Gastroenterology;  Laterality: N/A;  . ESOPHAGOGASTRODUODENOSCOPY (EGD) WITH PROPOFOL N/A 09/17/2018   Procedure: ESOPHAGOGASTRODUODENOSCOPY (EGD) WITH PROPOFOL;  Surgeon: Doran Stabler, MD;  [facility]: [facility];  Service: Gastroenterology;  Laterality: N/A;  . ESOPHAGOGASTRODUODENOSCOPY (EGD) WITH PROPOFOL N/A 03/09/2019   Procedure: ESOPHAGOGASTRODUODENOSCOPY (EGD) WITH PROPOFOL;  Surgeon: Doran Stabler, MD;  [facility];  Service: Gastroenterology;  Laterality: N/A;  . EUS N/A 02/03/2013   Procedure: LOWER ENDOSCOPIC ULTRASOUND (EUS);  Surgeon: Milus Banister, MD;  Location: [facility];  Service: Endoscopy;  Laterality: N/A;  . ILEOSTOMY CLOSURE N/A 08/24/2013   Procedure: CLOSURE OF LOOP ILEOSTOMY ;  Surgeon: Leighton Ruff, MD;  [facility]: [facility];  Service: General;  Laterality: N/A;  . IR ANGIO VERTEBRAL SEL SUBCLAVIAN INNOMINATE BILAT MOD SED  06/08/2018  . IR CT HEAD LTD  06/08/2018  . IR INTRAVSC STENT CERV CAROTID W/O EMB-PROT MOD SED INC ANGIO  06/08/2018  . IR PERCUTANEOUS ART THROMBECTOMY/INFUSION INTRACRANIAL INC DIAG ANGIO  06/08/2018  .  LAPAROSCOPIC LOW ANTERIOR RESECTION N/A 05/20/2013   Procedure: LAPAROSCOPIC LOW ANTERIOR RESECTION WITH SPLENIC FLEXURE MOBILIZATION;  Surgeon: Leighton Ruff, MD;  [facility]: [facility];  Service: General;  Laterality: N/A;  . OSTOMY N/A 05/20/2013   Procedure: diverting OSTOMY;  Surgeon: Leighton Ruff, MD;  [facility]: [facility];  Service: General;  Laterality: N/A;  . PERIPHERAL VASCULAR INTERVENTION Left 06/14/2018   Procedure: PERIPHERAL VASCULAR INTERVENTION;  Surgeon: Waynetta Sandy, MD;  Location: [facility];  Service: Cardiovascular;  Laterality: Left;  AVF on the Left  . RADIOLOGY WITH ANESTHESIA N/A 06/08/2018   Procedure: IR WITH ANESTHESIA;  Surgeon: Radiologist, Medication, MD;  Location[facility];  Service: Radiology;  Laterality: N/A;  . VIDEO ASSISTED THORACOSCOPY (VATS)/WEDGE RESECTION Left 11/20/2015   Procedure: VIDEO ASSISTED THORACOSCOPY (VATS)/LUNG RESECTION;  Surgeon: [name], MD;  Location: [facility];  Service: Thoracic;  Laterality: Left;  Marland Kitchen VIDEO BRONCHOSCOPY N/A 11/20/2015   Procedure: VIDEO BRONCHOSCOPY;  Surgeon: [name], MD;  Location: [facility];  Service: Thoracic;  Laterality: N/A;    There were no vitals filed for this visit.   Subjective Assessment - 10/17/19 1320    Subjective  "I had a stroke a while back and my balance is still not where it should be"    Pertinent History  HTN, dialysis (T, TH, S), rectal cancer, CHF, CVA on 07/05/18 (R MCA)    Limitations  Walking;Standing;House hold activities    How long can you stand comfortably?  5 mins    How long can you walk comfortably?  5 mins    Patient Stated Goals  "Go back fishing"    Currently in Pain?  No/denies         [facility] PT Assessment - 10/17/19 1323      Assessment   Medical Diagnosis  R CVA    Referring Provider (PT)  [name], MD    Onset Date/Surgical Date  07/05/18   date of CVA   Hand Dominance  Right    Prior Therapy  Outpatient Therapy (over 1 year ago)       Precautions   Precautions  Fall      Balance Screen   Has the patient fallen in the past 6 months  Yes    How many times?  1    Has the patient had a decrease in activity level because of a fear of falling?   Yes    Is the patient reluctant to leave their home because of a fear of falling?   Yes      [place] residence    Living Arrangements  Spouse/significant other    Available Help at Discharge  Family    Type of [place] Access  Level entry    [place]  One level    [place] - standard;Shower seat      Prior Function  Level of Independence  Independent    Vocation  Retired    Oncologist, Walking      Cognition   Overall Cognitive Status  Within Functional Limits for tasks assessed      Sensation   Light Touch  Appears Intact    Hot/Cold  Appears Intact    Proprioception  Appears Intact      Coordination   Gross Motor Movements are Fluid and Coordinated  Yes      Posture/Postural Control   Posture/Postural Control  Postural limitations    Postural Limitations  Forward head;Rounded Shoulders      ROM / Strength   AROM / PROM / Strength  Strength      Strength   Strength Assessment Site  Hip;Knee;Ankle    Right/Left Hip  Right;Left    Right Hip Flexion  4/5    Right Hip ABduction  4/5   seated   Right Hip ADduction  5/5    Left Hip Flexion  3+/5    Left Hip ABduction  4/5   seated   Left Hip ADduction  5/5    Right/Left Knee  Right;Left    Right Knee Flexion  5/5    Right Knee Extension  4/5    Left Knee Flexion  3+/5    Left Knee Extension  3+/5    Right/Left Ankle  Right;Left    Right Ankle Dorsiflexion  4/5    Left Ankle Dorsiflexion  3/5      Flexibility   Soft Tissue Assessment /Muscle Length  yes    Hamstrings  decreased hamstring flexibility, R>L       Transfers   Transfers  Sit to Stand    Sit to Stand  From chair/3-in-1;5: Supervision    Five time sit to stand  comments   13.66, w/o UE use      Ambulation/Gait   Ambulation/Gait  Yes    Ambulation/Gait Assistance  5: Supervision    Ambulation/Gait Assistance Details  Note L foot slap and increased hip and knee flexion to compensate during gait.      Ambulation Distance (Feet)  300 Feet    Assistive device  None    Gait Pattern  Decreased dorsiflexion - left;Decreased weight shift to left;Decreased stance time - left;Decreased step length - right;Decreased step length - left;Poor foot clearance - left    Ambulation Surface  Level;Indoor    Gait velocity  11.54 secs = 2.84 ft/secs    Stairs  Yes    Stairs Assistance  5: Supervision    Stair Management Technique  Two rails;Alternating pattern;Forwards    Number of Stairs  4    Height of Stairs  6    Gait Comments  Completed gait trial with L AFO 2 x 184ft, still demonstrate foot slap on LLE.  Feel that it could just be type of shoe as pt has slip on shoe therefore there was a lot of movement in shoe/brace.  Pt reports he does have velcro shoes at home.  Recommend he brings these to next session to better assess PLS brace.  Pt verbalized understanding.         Functional Gait  Assessment   Gait assessed   Yes    Gait Level Surface  Walks 20 ft in less than 7 sec but greater than 5.5 sec, uses assistive device, slower speed, mild gait deviations, or deviates 6-10 in outside of the 12 in walkway width.    Change in  Gait Speed  Able to change speed, demonstrates mild gait deviations, deviates 6-10 in outside of the 12 in walkway width, or no gait deviations, unable to achieve a major change in velocity, or uses a change in velocity, or uses an assistive device.    Gait with Horizontal Head Turns  Performs head turns smoothly with slight change in gait velocity (eg, minor disruption to smooth gait path), deviates 6-10 in outside 12 in walkway width, or uses an assistive device.    Gait with Vertical Head Turns  Performs task with slight change in gait  velocity (eg, minor disruption to smooth gait path), deviates 6 - 10 in outside 12 in walkway width or uses assistive device    Gait and Pivot Turn  Pivot turns safely in greater than 3 sec and stops with no loss of balance, or pivot turns safely within 3 sec and stops with mild imbalance, requires small steps to catch balance.    Step Over Obstacle  Is able to step over one shoe box (4.5 in total height) without changing gait speed. No evidence of imbalance.    Gait with Narrow Base of Support  Ambulates less than 4 steps heel to toe or cannot perform without assistance.    Gait with Eyes Closed  Walks 20 ft, uses assistive device, slower speed, mild gait deviations, deviates 6-10 in outside 12 in walkway width. Ambulates 20 ft in less than 9 sec but greater than 7 sec.    Ambulating Backwards  Walks 20 ft, slow speed, abnormal gait pattern, evidence for imbalance, deviates 10-15 in outside 12 in walkway width.    Steps  Alternating feet, must use rail.    Total Score  17    FGA comment:  < 19 = high risk fall                Objective measurements completed on examination: See above findings.              PT Education - 10/17/19 1354    Education Details  Educated on plan of care, initial evaluation findings. Provided education on brace options and wearing velcro shoes to next session.    Person(s) Educated  Patient    Methods  Explanation    Comprehension  Verbalized understanding       PT Short Term Goals - 10/17/19 1543      PT SHORT TERM GOAL #1   Title  The patient will be indep with HEP for LE strengthening, balance and general mobility. (Target Date: 11/16/19)    Time  4    Period  Weeks    Status  New    Target Date  11/16/19      PT SHORT TERM GOAL #2   Title  Pt will improve FGA to >/=20/30 in order to indicate decreased fall risk.    Time  4    Period  Weeks    Status  New      PT SHORT TERM GOAL #3   Title  Pt will improve gait speed to >/=3.40  ft/sec w/ AFO in order to indicate more normal gait speed.    Time  4    Period  Weeks    Status  New      PT SHORT TERM GOAL #4   Title  Pt will perform 5TSS </=11.5 secs without UE support in order to indicate decreased fall risk.    Time  4    Period  Weeks    Status  New        PT Long Term Goals - 10/17/19 1545      PT LONG TERM GOAL #1   Title  The patient will be indep with progression of HEP for post d/c exercise. (Target Date: 12/16/19)    Time  8    Period  Weeks    Status  New    Target Date  12/16/19      PT LONG TERM GOAL #2   Title  Pt will improve FGA to >/=23/30 in order to decrease fall risk.    Time  8    Period  Weeks    Status  New      PT LONG TERM GOAL #3   Title  Pt will ambulate x 1000' over varying outdoor surfaces without device with AFO at mod I level in order to return to leisure activiites.    Time  8    Period  Weeks    Status  New      PT LONG TERM GOAL #4   Title  Pt will simulate casting fishing line without LOB in order to return to leisure activity.    Time  8    Status  New             Plan - 10/17/19 1536    Clinical Impression Statement  Pt presents with history of RMCA CVA on 07/05/18 with residual L sided (LE) weakness and decreased balance and endurance.  Note history of rectal cancer, ESRD on dialysis, CHF, and HTN.  Upon PT evaluation, LLE weakness, 5TSS time of 13.66 with RLE preference in weight shift indicative of fall risk and decreased functional strength, gait speed of 2.84 ft/sec which is below normal for age and with L foot slat/steppage gait, and FGA of 17/30 indicative of high fall risk.  Pt will  benefit from skilled OP neuro PT in order to address deficits.    Personal Factors and Comorbidities  Age;Comorbidity 3+;Time since onset of injury/illness/exacerbation    Examination-Activity Limitations  Lift;Stand;Stairs;Squat;Locomotion Level    Examination-Participation Restrictions  Community Activity;Yard  Work;Other   fishing   Stability/Clinical Decision Making  Stable/Uncomplicated    Clinical Decision Making  Low    Rehab Potential  Good    PT Frequency  2x / week    PT Duration  8 weeks    PT Treatment/Interventions  ADLs/Self Care Home Management;Aquatic Therapy;DME Instruction;Gait training;Stair training;Functional mobility training;Therapeutic activities;Therapeutic exercise;Balance training;Neuromuscular re-education;Patient/family education;Orthotic Fit/Training;Passive range of motion;Vestibular    PT Next Visit Plan  Assess gait with PLS (or others if needed) AFO with velcro shoes, initiate HEP for LLE strength, sit<>stand, and high level balance.    Consulted and Agree with Plan of Care  Patient       Patient will benefit from skilled therapeutic intervention in order to improve the following deficits and impairments:  Abnormal gait, Decreased balance, Decreased activity tolerance, Decreased endurance, Decreased knowledge of use of DME, Decreased mobility, Decreased range of motion, Decreased strength, Impaired flexibility, Postural dysfunction  Visit Diagnosis: Muscle weakness (generalized)  Hemiplegia and hemiparesis following cerebral infarction affecting left non-dominant side (HCC)  Unsteadiness on feet  Other abnormalities of gait and mobility     Problem List Patient Active Problem List   Diagnosis Date Noted  . Meralgia paraesthetica, right 08/12/2019  . 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 Gait disturbance, post-stroke 04/17/2019  . Long term (current) use  of anticoagulants 12/31/2018  . Atrial fibrillation, unspecified type ([place]) 12/22/2018  . Longstanding persistent atrial fibrillation ([place]) 09/20/2018  . Functional diarrhea 07/14/2018  . Benign essential HTN   . Hyperglycemia   . ESRD on dialysis ([place]) 06/16/2018  . Mild malnutrition ([place]) 06/16/2018  . Stroke due to embolism ([place]) 06/16/2018  . CVA (cerebral vascular  accident) ([place]) 06/08/2018  . CHF (congestive heart failure) ([place]) 05/08/2018  . Hyperlipidemia LDL goal <70 09/23/2017  . OAB (overactive bladder) 01/09/2016  . SUI (stress urinary incontinence), male 01/09/2016  . Hypersomnia 10/30/2015  . Obesity (BMI 30.0-34.9) 10/30/2015  . Routine general medical examination at a health care facility 06/04/2015  . Medicare annual wellness visit, subsequent 06/04/2015  . Hereditary and idiopathic peripheral neuropathy 09/14/2014  . Rectal cancer ([place]) 02/01/2013  . Obstructive sleep apnea 07/29/2010  . GOUT 05/11/2007  . ERECTILE DYSFUNCTION, MILD 05/11/2007    Cameron Sprang, PT, MPT [facility] [address] [place], [place], [address] Phone: [phone]   Fax:  [phone] 10/17/19, 3:48 PM  Name: Jason NICOL Sr. MRN: [id number] Date of Birth: 05-01-1946

## 2019-10-20 NOTE — Telephone Encounter (Signed)
As long as it is in [place], that will work! Thank you so much!!  Cameron Sprang, PT, MPT [facility] [address] [place], [place], [address] Phone: [phone]   Fax:  [phone] 10/20/19, 2:04 PM

## 2019-10-21 ENCOUNTER — Ambulatory Visit: Payer: Medicare HMO | Admitting: Rehabilitative and Restorative Service Providers"

## 2019-10-21 ENCOUNTER — Other Ambulatory Visit: Payer: Self-pay

## 2019-10-21 ENCOUNTER — Encounter: Payer: Self-pay | Admitting: Rehabilitative and Restorative Service Providers"

## 2019-10-21 DIAGNOSIS — I69354 Hemiplegia and hemiparesis following cerebral infarction affecting left non-dominant side: Secondary | ICD-10-CM | POA: Diagnosis not present

## 2019-10-21 DIAGNOSIS — R2681 Unsteadiness on feet: Secondary | ICD-10-CM | POA: Diagnosis not present

## 2019-10-21 DIAGNOSIS — M21372 Foot drop, left foot: Secondary | ICD-10-CM

## 2019-10-21 DIAGNOSIS — M6281 Muscle weakness (generalized): Secondary | ICD-10-CM | POA: Diagnosis not present

## 2019-10-21 DIAGNOSIS — R2689 Other abnormalities of gait and mobility: Secondary | ICD-10-CM

## 2019-10-21 NOTE — Therapy (Signed)
[facility] [address] [place], [place], [address] Phone: [phone]   Fax:  [phone]  Physical Therapy Treatment  Patient Details  Name: Jason GLOOR Sr. MRN: [id number] Date of Birth: Sep 29, 1945 Referring Provider (PT): [name], MD   Encounter Date: 10/21/2019  PT End of Session - 10/21/19 1026    Visit Number  2    Number of Visits  17    Date for PT Re-Evaluation  12/16/19    PT Start Time  0933    PT Stop Time  1012    PT Time Calculation (min)  39 min    Equipment Utilized During Treatment  Gait belt    Activity Tolerance  Patient tolerated treatment well    Behavior During Therapy  WFL for tasks assessed/performed       Past Medical History:  Diagnosis Date  . Anemia   . Arthritis   . Chronic renal insufficiency 07/31/2011  . COPD (chronic obstructive pulmonary disease) ([place])    pt denies  . CVA (cerebral vascular accident) ([place]) 06/2018  . Deficiency anemia 07/20/2012  . Diverticulosis   . Edema 09/21/2010   Qualifier: Diagnosis of  By: Jerold Coombe    . ERECTILE DYSFUNCTION, MILD 05/11/2007   Qualifier: Diagnosis of  By: Sherren Mocha MD, Jory Ee   . Exertional dyspnea 10/30/2015  . GOUT 05/11/2007   Qualifier: Diagnosis of  By: Sherren Mocha MD, Jory Ee   . Hereditary and idiopathic peripheral neuropathy 09/14/2014  . History of radiation therapy 02/21/13-03/31/13   rectum 50.4Gy total dose  . Hypersomnia 10/30/2015  . Hypertension   . Lung nodule 10/30/2015  . OAB (overactive bladder) 01/09/2016  . Obesity (BMI 30.0-34.9) 10/30/2015  . Obstructive sleep apnea 07/29/2010   HST 11/2015 AHI 54.  On autocpap  >> 10 cm   . Peripheral edema 09/19/2013  . rectal ca dx'd 02/01/13   rectal. Radiation and chemotherapy- remains on chemotherapy-next tx. 08-22-13.   . S/P partial lobectomy of lung 11/20/2015  . S/P thoracotomy   . SUI (stress urinary incontinence), male 01/09/2016    Past Surgical History:  Procedure  Laterality Date  . A/V FISTULAGRAM Left 06/14/2018   Procedure: A/V FISTULAGRAM;  Surgeon: Waynetta Sandy, MD;  Location: [facility];  Service: Cardiovascular;  Laterality: Left;  . AV FISTULA PLACEMENT Left 03/24/2016   Procedure: CREATION OF LEFT BRACIOCEPHALIC ARTERIOVENOUS (AV) FISTULA;  Surgeon: Elam Dutch, MD;  Location: [facility];  Service: Vascular;  Laterality: Left;  . BIOPSY  09/17/2018   Procedure: BIOPSY;  Surgeon: Doran Stabler, MD;  Location: [facility];  Service: Gastroenterology;;  . BIOPSY  03/09/2019   Procedure: BIOPSY;  Surgeon: Doran Stabler, MD;  Location: [facility];  Service: Gastroenterology;;  . COLON SURGERY  05/20/2013  . COLONOSCOPY  2010   [facility]  . COLONOSCOPY WITH PROPOFOL N/A 09/17/2018   Procedure: COLONOSCOPY WITH PROPOFOL;  Surgeon: Doran Stabler, MD;  Location: [facility];  Service: Gastroenterology;  Laterality: N/A;  . ESOPHAGOGASTRODUODENOSCOPY (EGD) WITH PROPOFOL N/A 09/17/2018   Procedure: ESOPHAGOGASTRODUODENOSCOPY (EGD) WITH PROPOFOL;  Surgeon: Doran Stabler, MD;  [facility];  Service: Gastroenterology;  Laterality: N/A;  . ESOPHAGOGASTRODUODENOSCOPY (EGD) WITH PROPOFOL N/A 03/09/2019   Procedure: ESOPHAGOGASTRODUODENOSCOPY (EGD) WITH PROPOFOL;  Surgeon: Doran Stabler, MD;  [facility];  Service: Gastroenterology;  Laterality: N/A;  . EUS N/A 02/03/2013   Procedure: LOWER ENDOSCOPIC ULTRASOUND (  EUS);  Surgeon: Milus Banister, MD;  Location: [facility];  Service: Endoscopy;  Laterality: N/A;  . ILEOSTOMY CLOSURE N/A 08/24/2013   Procedure: CLOSURE OF LOOP ILEOSTOMY ;  Surgeon: Leighton Ruff, MD;  [facility]: [facility];  Service: General;  Laterality: N/A;  . IR ANGIO VERTEBRAL SEL SUBCLAVIAN INNOMINATE BILAT MOD SED  06/08/2018  . IR CT HEAD LTD  06/08/2018  . IR INTRAVSC STENT CERV CAROTID W/O EMB-PROT MOD SED INC ANGIO  06/08/2018  . IR PERCUTANEOUS ART THROMBECTOMY/INFUSION  INTRACRANIAL INC DIAG ANGIO  06/08/2018  . LAPAROSCOPIC LOW ANTERIOR RESECTION N/A 05/20/2013   Procedure: LAPAROSCOPIC LOW ANTERIOR RESECTION WITH SPLENIC FLEXURE MOBILIZATION;  Surgeon: Leighton Ruff, MD;  Location: [facility];  Service: General;  Laterality: N/A;  . OSTOMY N/A 05/20/2013   Procedure: diverting OSTOMY;  Surgeon: Leighton Ruff, MD;  Location: [facility];  Service: General;  Laterality: N/A;  . PERIPHERAL VASCULAR INTERVENTION Left 06/14/2018   Procedure: PERIPHERAL VASCULAR INTERVENTION;  Surgeon: Waynetta Sandy, MD;  Location: [facility];  Service: Cardiovascular;  Laterality: Left;  AVF on the Left  . RADIOLOGY WITH ANESTHESIA N/A 06/08/2018   Procedure: IR WITH ANESTHESIA;  Surgeon: Radiologist, Medication, MD;  Location: [facility];  Service: Radiology;  Laterality: N/A;  . VIDEO ASSISTED THORACOSCOPY (VATS)/WEDGE RESECTION Left 11/20/2015   Procedure: VIDEO ASSISTED THORACOSCOPY (VATS)/LUNG RESECTION;  Surgeon: [name], MD;  Location: [facility];  Service: Thoracic;  Laterality: Left;  Marland Kitchen VIDEO BRONCHOSCOPY N/A 11/20/2015   Procedure: VIDEO BRONCHOSCOPY;  Surgeon: [name], MD;  [facility];  Service: Thoracic;  Laterality: N/A;    There were no vitals filed for this visit.  Subjective Assessment - 10/21/19 0934    Subjective  No changes since initial evaluation. Presents with velcro shoes donned for trial.    Pertinent History  HTN, dialysis (T, TH, S), rectal cancer, CHF, CVA on 07/05/18 (R MCA)    Limitations  Walking;Standing;House hold activities    How long can you stand comfortably?  5 mins    How long can you walk comfortably?  5 mins    Patient Stated Goals  "Go back fishing"    Currently in Pain?  No/denies                       [facility] Adult PT Treatment/Exercise - 10/21/19 0937      Transfers   Transfers  Sit to Stand;Stand to Sit    Sit to Stand  From chair/3-in-1;5: Supervision    Five time sit to stand comments   --     Stand to Sit  6: Modified independent (Device/Increase time);With upper extremity assist;With armrests      Ambulation/Gait   Ambulation/Gait  Yes    Ambulation/Gait Assistance  5: Supervision    Ambulation/Gait Assistance Details  Notable reduction in L foot slap with use of L PLS AFO (see gait comments, below)     Ambulation Distance (Feet)  207 Feet   207 x 2, 115 x 2    Assistive device  None    Gait Pattern  Decreased dorsiflexion - left;Decreased weight shift to left;Decreased stance time - left;Decreased step length - right;Decreased step length - left;Poor foot clearance - left    Ambulation Surface  Level;Indoor    Gait velocity  --    Stairs  Yes    Stairs Assistance  5: Supervision    Stair Management Technique  Two rails;Alternating pattern;Forwards;Other (comment)  with L PLS AFO donned    Number of Stairs  4   4 x 2 reps    Height of Stairs  6    Gait Comments  Completed gait training with and without L PLS AFO donned (115 ft each and 207 ft with L PLS AFO). Notable improvement in gait mechanics and reduction in L foot slap. No appreciable improvement noted to date in gait velocity with AFO donned, but improved safety due better L foot clearance. Due to strong supination of L foot/ankle, may benefit from trial of AFO wiht medial strut to foster more neutral alignment with heelstrike. Patient instructed to continue to wear/bring velcro shoes in future sessions as they worked very well with AFO trial.      Posture/Postural Control   Posture/Postural Control  --    Postural Limitations  --       Therapeutic Exercise  Standing March with Counter Support - 1 sets - 10 reps - performed bilaterally Standing Hip Abduction with Counter Support  - 1 sets - 10 reps - performed bilaterally  Sit to Stand with Hands on Knees - 1 sets - 10 reps Seated Gastroc Stretch with Strap - 1 sets - 2 reps - 30 seconds hold - performed bilaterally  Side Stepping with Counter Support - x 8  "laps" across counter in PT clinic        PT Education - 10/21/19 1025    Education Details  initiated HEP for LE strength, flexibility and balance (see patient instructions); continue to bring velcro shoes to PT sessions for L AFO trial(s)    Person(s) Educated  Patient    Methods  Explanation;Demonstration;Tactile cues;Verbal cues;Handout    Comprehension  Verbalized understanding;Returned demonstration;Verbal cues required;Tactile cues required;Need further instruction       PT Short Term Goals - 10/17/19 1543      PT SHORT TERM GOAL #1   Title  The patient will be indep with HEP for LE strengthening, balance and general mobility. (Target Date: 11/16/19)    Time  4    Period  Weeks    Status  New    Target Date  11/16/19      PT SHORT TERM GOAL #2   Title  Pt will improve FGA to >/=20/30 in order to indicate decreased fall risk.    Time  4    Period  Weeks    Status  New      PT SHORT TERM GOAL #3   Title  Pt will improve gait speed to >/=3.40 ft/sec w/ AFO in order to indicate more normal gait speed.    Time  4    Period  Weeks    Status  New      PT SHORT TERM GOAL #4   Title  Pt will perform 5TSS </=11.5 secs without UE support in order to indicate decreased fall risk.    Time  4    Period  Weeks    Status  New        PT Long Term Goals - 10/17/19 1545      PT LONG TERM GOAL #1   Title  The patient will be indep with progression of HEP for post d/c exercise. (Target Date: 12/16/19)    Time  8    Period  Weeks    Status  New    Target Date  12/16/19      PT LONG TERM GOAL #2   Title  Pt will  improve FGA to >/=23/30 in order to decrease fall risk.    Time  8    Period  Weeks    Status  New      PT LONG TERM GOAL #3   Title  Pt will ambulate x 1000' over varying outdoor surfaces without device with AFO at mod I level in order to return to leisure activiites.    Time  8    Period  Weeks    Status  New      PT LONG TERM GOAL #4   Title  Pt will  simulate casting fishing line without LOB in order to return to leisure activity.    Time  8    Status  New            Plan - 10/21/19 1027    Clinical Impression Statement  Today's session focused on HEP initiation for strength, flexibility and balance, which he tolerated very well. In addition to trial of L PLS AFO for gait training with notable improvement in L foot slap, improved mechanics and improved safety. Patient may benefit from AFO trial with medial strut to aid in correction of L ankle supination. He will beneift from skilled PT to maximize functional mobility.    Personal Factors and Comorbidities  Age;Comorbidity 3+;Time since onset of injury/illness/exacerbation    Examination-Activity Limitations  Lift;Stand;Stairs;Squat;Locomotion Level    Examination-Participation Restrictions  Community Activity;Yard Work;Other   fishing   Stability/Clinical Decision Making  Stable/Uncomplicated    Rehab Potential  Good    PT Frequency  2x / week    PT Duration  8 weeks    PT Treatment/Interventions  ADLs/Self Care Home Management;Aquatic Therapy;DME Instruction;Gait training;Stair training;Functional mobility training;Therapeutic activities;Therapeutic exercise;Balance training;Neuromuscular re-education;Patient/family education;Orthotic Fit/Training;Passive range of motion;Vestibular    PT Next Visit Plan  gait with PLS AFO - trial AFO with medial strut to see if supination is better corrected?, initial HEP compliance so far?, sit<>stand, and high level balance.    PT Home Exercise Plan  see patient instructions for initial HEP access code    Consulted and Agree with Plan of Care  Patient       Patient will benefit from skilled therapeutic intervention in order to improve the following deficits and impairments:  Abnormal gait, Decreased balance, Decreased activity tolerance, Decreased endurance, Decreased knowledge of use of DME, Decreased mobility, Decreased range of motion,  Decreased strength, Impaired flexibility, Postural dysfunction  Visit Diagnosis: Left foot drop  Muscle weakness (generalized)  Hemiplegia and hemiparesis following cerebral infarction affecting left non-dominant side (HCC)  Unsteadiness on feet  Other abnormalities of gait and mobility     Problem List Patient Active Problem List   Diagnosis Date Noted  . Meralgia paraesthetica, right 08/12/2019  . 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 Gait disturbance, post-stroke 04/17/2019  . Long term (current) use of anticoagulants 12/31/2018  . Atrial fibrillation, unspecified type ([place]) 12/22/2018  . Longstanding persistent atrial fibrillation ([place]) 09/20/2018  . Functional diarrhea 07/14/2018  . Benign essential HTN   . Hyperglycemia   . ESRD on dialysis ([place]) 06/16/2018  . Mild malnutrition ([place]) 06/16/2018  . Stroke due to embolism ([place]) 06/16/2018  . CVA (cerebral vascular accident) ([place]) 06/08/2018  . CHF (congestive heart failure) ([place]) 05/08/2018  . Hyperlipidemia LDL goal <70 09/23/2017  . OAB (overactive bladder) 01/09/2016  . SUI (stress urinary incontinence), male 01/09/2016  . Hypersomnia 10/30/2015  . Obesity (BMI 30.0-34.9) 10/30/2015  . Routine general  medical examination at a health care facility 06/04/2015  . Medicare annual wellness visit, subsequent 06/04/2015  . Hereditary and idiopathic peripheral neuropathy 09/14/2014  . Rectal cancer ([place]) 02/01/2013  . Obstructive sleep apnea 07/29/2010  . GOUT 05/11/2007  . ERECTILE DYSFUNCTION, MILD 05/11/2007    [place], PT, DPT  10/21/2019, 10:33 AM  [facility] [address] [place], [place], [address] Phone: [phone]   Fax:  [phone]  Name: MALAKHAI BEITLER Sr. MRN: [id number] Date of Birth: 1946/02/08

## 2019-10-21 NOTE — Patient Instructions (Signed)
Access Code: [facility] URL: [email or web address] Date: 10/21/2019 Prepared by: Rosanne Ashing  Exercises Standing March with Counter Support - 1 x daily - 5 x weekly - 1 sets - 10 reps Standing Hip Abduction with Counter Support - 1 x daily - 5 x weekly - 1 sets - 10 reps Sit to Stand with Hands on Knees - 1 x daily - 5 x weekly - 1 sets - 10 reps Seated Gastroc Stretch with Strap - 2 x daily - 7 x weekly - 1 sets - 2 reps - 30 seconds hold Side Stepping with Counter Support - 1 x daily - 5 x weekly

## 2019-10-24 ENCOUNTER — Ambulatory Visit (HOSPITAL_COMMUNITY)
Admission: RE | Admit: 2019-10-24 | Discharge: 2019-10-24 | Disposition: A | Discharge status: Home / Self Care | Payer: Medicare HMO | Source: Ambulatory Visit | Attending: Interventional Radiology | Admitting: Interventional Radiology

## 2019-10-24 ENCOUNTER — Other Ambulatory Visit: Payer: Self-pay

## 2019-10-24 DIAGNOSIS — I639 Cerebral infarction, unspecified: Secondary | ICD-10-CM | POA: Insufficient documentation

## 2019-10-25 ENCOUNTER — Ambulatory Visit: Payer: Medicare HMO

## 2019-10-25 ENCOUNTER — Telehealth (HOSPITAL_COMMUNITY): Payer: Self-pay

## 2019-10-25 NOTE — Telephone Encounter (Signed)
Pt agreed to f/u in 6 months with cta head/neck. AW 

## 2019-10-26 ENCOUNTER — Ambulatory Visit: Payer: Medicare Other | Admitting: Physician Assistant

## 2019-10-26 ENCOUNTER — Telehealth: Payer: Self-pay | Admitting: Gastroenterology

## 2019-10-26 ENCOUNTER — Telehealth: Payer: Self-pay

## 2019-10-26 NOTE — Telephone Encounter (Signed)
I spoke to the patient and his wife and informed him that we would cancel appointment with Nicki Reaper today, because in a previous note from 10/20, Dr Caryl Comes states that the patient will resume f/u with Dr Virgina Jock at [facility].    They will reach out to his office for further advisement.

## 2019-10-26 NOTE — Telephone Encounter (Signed)
Please see note below.  OK to send to pharmacy?  Cindy  Per office protocol, patient can hold warfarin for 5 days prior to procedure. Patient will need bridging with Lovenox (enoxaparin) around procedure. Since patient is on dialysis, recommend bridging with lower dose of Lovenox (1mg /kg once daily). Due to impaired renal function, recommend giving last dose of Lovenox in the morning 48 hours prior to procedure (5/16 AM).

## 2019-10-26 NOTE — Progress Notes (Deleted)
Cardiology Office Note:    Date:  10/26/2019   ID:  Jason Cotta Sr., DOB Oct 19, 1945, MRN [id number]  PCP:  Jason Lima, MD  Cardiologist:  No primary care provider on file. *** Electrophysiologist:  None   Referring MD: Jason Lima, MD   Chief Complaint:  No chief complaint on file.    Patient Profile:    Jason PINEIRO Sr. is a 74 y.o. male with:  Chronic systolic CHF Non-ischemic cardiomyopathy  EF 20% PVCs ESRD, on dialysis Hx of CVA in 06/2018 Carotid artery disease S/p R ICA stenting 06/2018 Paroxysmal atrial flutter Apixaban >> Warfarin due to cost Metastatic colon CA Adenocarcinoma colorectal CA in 2014; pulmonary Mets in 11.2017 S/p L VATS w/ LUL resection (Dr. Servando Snare) COPD Hypertension  OSA ***  Prior CV studies: *** Carotid US 10/24/2019 Patent R ICA stent; LICA 2-94  Event Monitor 09/06/2018 NSR, PVCs, no sig arrhythmia  Echocardiogram 06/09/18 EF 20-25, diff HK, mild BAE  Echocardiogram 12/09/17 [facility]) Mild conc LVH, EF 20, Gr 2 DD, mod LAE, mild RAE, mild MR, mod to severe TR, PASP 70    History of Present Illness:    [name] ***  The DICTATELATER SmartLink is not supported in this context. ***   Past Medical History:  Diagnosis Date  . Anemia   . Arthritis   . Chronic renal insufficiency 07/31/2011  . COPD (chronic obstructive pulmonary disease) ([place])    pt denies  . CVA (cerebral vascular accident) ([place]) 06/2018  . Deficiency anemia 07/20/2012  . Diverticulosis   . Edema 09/21/2010   Qualifier: Diagnosis of  By: Jerold Coombe    . ERECTILE DYSFUNCTION, MILD 05/11/2007   Qualifier: Diagnosis of  By: Sherren Mocha MD, Jory Ee   . Exertional dyspnea 10/30/2015  . GOUT 05/11/2007   Qualifier: Diagnosis of  By: Sherren Mocha MD, Jory Ee   . Hereditary and idiopathic peripheral neuropathy 09/14/2014  . History of radiation therapy 02/21/13-03/31/13   rectum 50.4Gy total dose  . Hypersomnia 10/30/2015  . Hypertension   . Lung nodule  10/30/2015  . OAB (overactive bladder) 01/09/2016  . Obesity (BMI 30.0-34.9) 10/30/2015  . Obstructive sleep apnea 07/29/2010   HST 11/2015 AHI 54.  On autocpap  >> 10 cm   . Peripheral edema 09/19/2013  . rectal ca dx'd 02/01/13   rectal. Radiation and chemotherapy- remains on chemotherapy-next tx. 08-22-13.   . S/P partial lobectomy of lung 11/20/2015  . S/P thoracotomy   . SUI (stress urinary incontinence), male 01/09/2016    Current Medications: No outpatient medications have been marked as taking for the 10/26/19 encounter (Appointment) with Richardson Dopp T, PA-C.     Allergies:   Nsaids, Lactase, Lactose intolerance (gi), Phentermine, and Amlodipine   Social History   Tobacco Use  . Smoking status: Former Smoker    Packs/day: 0.50    Years: 10.00    Pack years: 5.00    Types: Cigarettes    Quit date: 07/07/1977    Years since quitting: 42.3  . Smokeless tobacco: Never Used  Substance Use Topics  . Alcohol use: Not Currently    Alcohol/week: 0.0 standard drinks    Comment: drinks occasionally  . Drug use: No     Family Hx: The patient's family history includes Hypertension in his father, mother, and another family member; Stroke in his father and mother. There is no history of Colon cancer.  ROS   EKGs/Labs/Other Test Reviewed:    EKG:  EKG is *** ordered today.  The ekg ordered today demonstrates ***  Recent Labs: 02/26/2019: ALT 14 04/08/2019: BUN 27; Creatinine, Ser 6.84; Hemoglobin 13.2; Platelets 179; Potassium 3.7; Sodium 138   Recent Lipid Panel Lab Results  Component Value Date/Time   CHOL 151 08/17/2018 08:16 AM   TRIG 114 08/17/2018 08:16 AM   TRIG 104 05/11/2006 10:14 AM   HDL 66 08/17/2018 08:16 AM   CHOLHDL 2.3 08/17/2018 08:16 AM   CHOLHDL 3.6 06/09/2018 04:54 AM   LDLCALC 62 08/17/2018 08:16 AM   LDLDIRECT 120.0 06/04/2015 10:15 AM    Physical Exam:    VS:  There were no vitals taken for this visit.    Wt Readings from Last 3 Encounters:    09/26/19 156 lb 6 oz (70.9 kg)  08/29/19 151 lb 4.8 oz (68.6 kg)  08/12/19 147 lb (66.7 kg)     Physical Exam ***  ASSESSMENT & PLAN:    ***  Dispo:  No follow-ups on file.   Medication Adjustments/Labs and Tests Ordered: Current medicines are reviewed at length with the patient today.  Concerns regarding medicines are outlined above.  Tests Ordered: No orders of the defined types were placed in this encounter.  Medication Changes: No orders of the defined types were placed in this encounter.   Signed, Richardson Dopp, PA-C  10/26/2019 1:43 PM    [facility] [place], [place], [place]  [address] Phone: [phone]; Fax: [phone]

## 2019-10-26 NOTE — Telephone Encounter (Signed)
Pt requested a call back to discuss upcoming hospital procedure.

## 2019-10-26 NOTE — Telephone Encounter (Signed)
Pt is confused about who he is supposed to see for cardiac clearance prior to colon. Received call today stating he is not to see Dr. Caryl Comes he is to see a different provider. Pt wants to know if this will effect procedure. Vivien Rota there is a note from you regarding clearance. Do you know anything about this?

## 2019-10-27 ENCOUNTER — Telehealth: Payer: Self-pay | Admitting: General Practice

## 2019-10-27 ENCOUNTER — Telehealth: Payer: Self-pay

## 2019-10-27 ENCOUNTER — Ambulatory Visit: Payer: Medicare HMO

## 2019-10-27 NOTE — Telephone Encounter (Signed)
New message   1.Medication Requested:gabapentin (NEURONTIN) 100 MG capsule  2. Pharmacy (Name, Street, [place]):[facility]  3. On Med List: yes    4. Last Visit with PCP: 2.5.21   5. Next visit date with PCP: no appt is made at this time     Agent: Please be advised that RX refills may take up to 3 business days. We ask that you follow-up with your pharmacy.

## 2019-10-27 NOTE — Telephone Encounter (Signed)
Patients wife is returning your call

## 2019-10-27 NOTE — Telephone Encounter (Signed)
Instructions for coumadin and Lovenox pre and post procedure on 5/18.  5/13 - Last dose of coumadin until after procedure 5/14 - Nothing (No coumadin and no Lovenox) 5/15 - Lovenox in the AM 5/16 - Lovenox in the AM 5/17 - Nothing (No Lovenox) 5/18 - Nothing (No Lovenox) 5/19 - Lovenox in the AM and 1 1/2 tablets of coumadin 5/20 - Lovenox in the AM and 1 1/2 tablets of coumadin 5/21 - Lovenox in the AM and 1 tablet of coumadin 5/22 - Lovenox in the AM and 1 tablet of coumadin 5/23 - Stop Lovenox and resume current dosage of coumadin  5/25 - Check INR in the coumadin clinic.

## 2019-10-28 ENCOUNTER — Ambulatory Visit: Payer: Medicare HMO | Admitting: Rehabilitation

## 2019-10-28 ENCOUNTER — Encounter: Payer: Self-pay | Admitting: Rehabilitation

## 2019-10-28 ENCOUNTER — Other Ambulatory Visit: Payer: Self-pay

## 2019-10-28 DIAGNOSIS — M21372 Foot drop, left foot: Secondary | ICD-10-CM | POA: Diagnosis not present

## 2019-10-28 DIAGNOSIS — R2681 Unsteadiness on feet: Secondary | ICD-10-CM | POA: Diagnosis not present

## 2019-10-28 DIAGNOSIS — M6281 Muscle weakness (generalized): Secondary | ICD-10-CM | POA: Diagnosis not present

## 2019-10-28 DIAGNOSIS — I69354 Hemiplegia and hemiparesis following cerebral infarction affecting left non-dominant side: Secondary | ICD-10-CM

## 2019-10-28 DIAGNOSIS — R2689 Other abnormalities of gait and mobility: Secondary | ICD-10-CM

## 2019-10-28 NOTE — Therapy (Signed)
[facility] [address] [place], [place], [address] Phone: [phone]   Fax:  [phone]  Physical Therapy Treatment  Patient Details  Name: Jason DAHLEM Sr. MRN: [id number] Date of Birth: January 10, 1946 Referring Provider (PT): [name], MD   Encounter Date: 10/28/2019  PT End of Session - 10/28/19 1246    Visit Number  3    Number of Visits  17    Date for PT Re-Evaluation  12/16/19    Authorization Type  [employer] medicare    Progress Note Due on Visit  10    PT Start Time  1146    PT Stop Time  1230    PT Time Calculation (min)  44 min    Equipment Utilized During Treatment  Gait belt    Activity Tolerance  Patient tolerated treatment well    Behavior During Therapy  WFL for tasks assessed/performed       Past Medical History:  Diagnosis Date  . Anemia   . Arthritis   . Chronic renal insufficiency 07/31/2011  . COPD (chronic obstructive pulmonary disease) ([place])    pt denies  . CVA (cerebral vascular accident) ([place]) 06/2018  . Deficiency anemia 07/20/2012  . Diverticulosis   . Edema 09/21/2010   Qualifier: Diagnosis of  By: Jerold Coombe    . ERECTILE DYSFUNCTION, MILD 05/11/2007   Qualifier: Diagnosis of  By: Sherren Mocha MD, Jory Ee   . Exertional dyspnea 10/30/2015  . GOUT 05/11/2007   Qualifier: Diagnosis of  By: Sherren Mocha MD, Jory Ee   . Hereditary and idiopathic peripheral neuropathy 09/14/2014  . History of radiation therapy 02/21/13-03/31/13   rectum 50.4Gy total dose  . Hypersomnia 10/30/2015  . Hypertension   . Lung nodule 10/30/2015  . OAB (overactive bladder) 01/09/2016  . Obesity (BMI 30.0-34.9) 10/30/2015  . Obstructive sleep apnea 07/29/2010   HST 11/2015 AHI 54.  On autocpap  >> 10 cm   . Peripheral edema 09/19/2013  . rectal ca dx'd 02/01/13   rectal. Radiation and chemotherapy- remains on chemotherapy-next tx. 08-22-13.   . S/P partial lobectomy of lung 11/20/2015  . S/P thoracotomy   . SUI (stress  urinary incontinence), male 01/09/2016    Past Surgical History:  Procedure Laterality Date  . A/V FISTULAGRAM Left 06/14/2018   Procedure: A/V FISTULAGRAM;  Surgeon: Waynetta Sandy, MD;  Location: [facility];  Service: Cardiovascular;  Laterality: Left;  . AV FISTULA PLACEMENT Left 03/24/2016   Procedure: CREATION OF LEFT BRACIOCEPHALIC ARTERIOVENOUS (AV) FISTULA;  Surgeon: Elam Dutch, MD;  Location: [facility];  Service: Vascular;  Laterality: Left;  . BIOPSY  09/17/2018   Procedure: BIOPSY;  Surgeon: Doran Stabler, MD;  Location: [facility];  Service: Gastroenterology;;  . BIOPSY  03/09/2019   Procedure: BIOPSY;  Surgeon: Doran Stabler, MD;  Location: [facility];  Service: Gastroenterology;;  . COLON SURGERY  05/20/2013  . COLONOSCOPY  2010   [facility]  . COLONOSCOPY WITH PROPOFOL N/A 09/17/2018   Procedure: COLONOSCOPY WITH PROPOFOL;  Surgeon: Doran Stabler, MD;  Location: [facility];  Service: Gastroenterology;  Laterality: N/A;  . ESOPHAGOGASTRODUODENOSCOPY (EGD) WITH PROPOFOL N/A 09/17/2018   Procedure: ESOPHAGOGASTRODUODENOSCOPY (EGD) WITH PROPOFOL;  Surgeon: Doran Stabler, MD;  Location: [facility];  Service: Gastroenterology;  Laterality: N/A;  . ESOPHAGOGASTRODUODENOSCOPY (EGD) WITH PROPOFOL N/A 03/09/2019   Procedure: ESOPHAGOGASTRODUODENOSCOPY (EGD) WITH PROPOFOL;  Surgeon: Doran Stabler, MD;  Location: [facility];  Service: Gastroenterology;  Laterality: N/A;  . EUS N/A 02/03/2013   Procedure: LOWER ENDOSCOPIC ULTRASOUND (EUS);  Surgeon: Milus Banister, MD;  Location: [facility];  Service: Endoscopy;  Laterality: N/A;  . ILEOSTOMY CLOSURE N/A 08/24/2013   Procedure: CLOSURE OF LOOP ILEOSTOMY ;  Surgeon: Leighton Ruff, MD;  Location: [facility];  Service: General;  Laterality: N/A;  . IR ANGIO VERTEBRAL SEL SUBCLAVIAN INNOMINATE BILAT MOD SED  06/08/2018  . IR CT HEAD LTD  06/08/2018  . IR INTRAVSC STENT CERV CAROTID W/O EMB-PROT MOD  SED INC ANGIO  06/08/2018  . IR PERCUTANEOUS ART THROMBECTOMY/INFUSION INTRACRANIAL INC DIAG ANGIO  06/08/2018  . LAPAROSCOPIC LOW ANTERIOR RESECTION N/A 05/20/2013   Procedure: LAPAROSCOPIC LOW ANTERIOR RESECTION WITH SPLENIC FLEXURE MOBILIZATION;  Surgeon: Leighton Ruff, MD;  Location: [facility];  Service: General;  Laterality: N/A;  . OSTOMY N/A 05/20/2013   Procedure: diverting OSTOMY;  Surgeon: Leighton Ruff, MD;  Location: [facility];  Service: General;  Laterality: N/A;  . PERIPHERAL VASCULAR INTERVENTION Left 06/14/2018   Procedure: PERIPHERAL VASCULAR INTERVENTION;  Surgeon: Waynetta Sandy, MD;  Location: [facility];  Service: Cardiovascular;  Laterality: Left;  AVF on the Left  . RADIOLOGY WITH ANESTHESIA N/A 06/08/2018   Procedure: IR WITH ANESTHESIA;  Surgeon: Radiologist, Medication, MD;  Location: [facility];  Service: Radiology;  Laterality: N/A;  . VIDEO ASSISTED THORACOSCOPY (VATS)/WEDGE RESECTION Left 11/20/2015   Procedure: VIDEO ASSISTED THORACOSCOPY (VATS)/LUNG RESECTION;  Surgeon: [name], MD;  Location: [facility];  Service: Thoracic;  Laterality: Left;  Marland Kitchen VIDEO BRONCHOSCOPY N/A 11/20/2015   Procedure: VIDEO BRONCHOSCOPY;  Surgeon: [name], MD;  Location: [facility];  Service: Thoracic;  Laterality: N/A;    There were no vitals filed for this visit.  Subjective Assessment - 10/28/19 1148    Subjective  No changes since last visit, no falls.    Pertinent History  HTN, dialysis (T, TH, S), rectal cancer, CHF, CVA on 07/05/18 ([facility])    Limitations  Walking;Standing;House hold activities    Patient Stated Goals  "Go back fishing"    Currently in Pain?  No/denies                       [facility] Adult PT Treatment/Exercise - 10/28/19 1209      Ambulation/Gait   Ambulation/Gait  Yes    Ambulation/Gait Assistance  5: Supervision    Ambulation/Gait Assistance Details  Pt ambulatory without device with Thuasne PLS AFO with lateral strut vs medial  strut from last session.  This brace is stiffer but he did very well with decreased foot slap and no overt supination in brace.  Will send order to [facility] to have them come to session for further assessment.     Ambulation Distance (Feet)  150 Feet   x 2 reps   Assistive device  None    Gait Pattern  Decreased dorsiflexion - left;Decreased weight shift to left;Decreased stance time - left;Decreased step length - right;Decreased step length - left;Poor foot clearance - left    Ambulation Surface  Level;Indoor    Stairs  Yes    Stairs Assistance  5: Supervision;4: Min guard    Stairs Assistance Details (indicate cue type and reason)  Stairs for LLE strengthening and NMR with single rail in reciprocal pattern.  Cues for safe descent with AFO and cues for forward weight shift over LLE.      Stair Management Technique  One rail Right;Alternating pattern;Forwards    Number of Stairs  12    Height of Stairs  6      High Level Balance   High Level Balance Activities  Backward walking;Direction changes;Turns;Sudden stops;Head turns    High Level Balance Comments  x 230' with close S, no overt LOB      Neuro Re-ed    Neuro Re-ed Details   Step ups with LLE remaining on step (6")  with RLE march with BUE support x 5 reps>single UE support x 8 reps, lateral step ups/down (6" step) x 10 reps total with BUE support (cues for light support) with max cues for wider step to allow room for oppsite LE.  Standing on foam beam with feet apart EO maintaining balance x 2 sets of 20 secs, alternating floor touches forward x 10 reps, backwards x 10 reps (with intermittent UE support and min/guard for steadying), wall bumps x 10 reps standing on foam beam with feet apart (cues for achieving full upright posture with hips over feet), forward stepping over orange barriers x 4 reps (4 sets) with single UE support with cues for less LLE circumduction), lateral stepping over orange barriers x 4 sets (4 reps) with max cues for  wider step), cone taps alternating LEs on solid ground with light single UE support x 15 reps, double taps with single UE support x 10 reps (cues for increased L hip flex when stepping back to ground from cone), standing on foam beam performing alteranting cone taps x 10 reps with single UE support.                PT Short Term Goals - 10/17/19 1543      PT SHORT TERM GOAL #1   Title  The patient will be indep with HEP for LE strengthening, balance and general mobility. (Target Date: 11/16/19)    Time  4    Period  Weeks    Status  New    Target Date  11/16/19      PT SHORT TERM GOAL #2   Title  Pt will improve FGA to >/=20/30 in order to indicate decreased fall risk.    Time  4    Period  Weeks    Status  New      PT SHORT TERM GOAL #3   Title  Pt will improve gait speed to >/=3.40 ft/sec w/ AFO in order to indicate more normal gait speed.    Time  4    Period  Weeks    Status  New      PT SHORT TERM GOAL #4   Title  Pt will perform 5TSS </=11.5 secs without UE support in order to indicate decreased fall risk.    Time  4    Period  Weeks    Status  New        PT Long Term Goals - 10/17/19 1545      PT LONG TERM GOAL #1   Title  The patient will be indep with progression of HEP for post d/c exercise. (Target Date: 12/16/19)    Time  8    Period  Weeks    Status  New    Target Date  12/16/19      PT LONG TERM GOAL #2   Title  Pt will improve FGA to >/=23/30 in order to decrease fall risk.    Time  8    Period  Weeks  Status  New      PT LONG TERM GOAL #3   Title  Pt will ambulate x 1000' over varying outdoor surfaces without device with AFO at mod I level in order to return to leisure activiites.    Time  8    Period  Weeks    Status  New      PT LONG TERM GOAL #4   Title  Pt will simulate casting fishing line without LOB in order to return to leisure activity.    Time  8    Status  New            Plan - 10/28/19 1246    Clinical Impression  Statement  Skilled session continued to address AFO assessment with Thuasne PLS AFO with lateral strut which seemed to provide adequate support with no supination into brace as with previous PLS brace.  PT has called and scheduled for Gerald Stabs to come to clinic next week for formal assessment.  Also continued to work on high level balance with emphasis on SLS, compliant surfaces and hip strategy.  Pt with most difficulty on compliant surfaces in SLS.    Personal Factors and Comorbidities  Age;Comorbidity 3+;Time since onset of injury/illness/exacerbation    Examination-Activity Limitations  Lift;Stand;Stairs;Squat;Locomotion Level    Examination-Participation Restrictions  Community Activity;Yard Work;Other   fishing   Stability/Clinical Decision Making  Stable/Uncomplicated    Rehab Potential  Good    PT Frequency  2x / week    PT Duration  8 weeks    PT Treatment/Interventions  ADLs/Self Care Home Management;Aquatic Therapy;DME Instruction;Gait training;Stair training;Functional mobility training;Therapeutic activities;Therapeutic exercise;Balance training;Neuromuscular re-education;Patient/family education;Orthotic Fit/Training;Passive range of motion;Vestibular    PT Next Visit Plan  Brooke-Chris will be at your session on the 30th for AFO assessment.  I have already faxed order and facesheet over.  I am wondering if Fabio Asa is too stiff or if there is some less restrictive brace with pure posterior strut (or lateral strut)?, initial HEP compliance so far?, sit<>stand, and high level balance.    PT Home Exercise Plan  see patient instructions for initial HEP access code    Consulted and Agree with Plan of Care  Patient       Patient will benefit from skilled therapeutic intervention in order to improve the following deficits and impairments:  Abnormal gait, Decreased balance, Decreased activity tolerance, Decreased endurance, Decreased knowledge of use of DME, Decreased mobility, Decreased range of  motion, Decreased strength, Impaired flexibility, Postural dysfunction  Visit Diagnosis: Left foot drop  Muscle weakness (generalized)  Hemiplegia and hemiparesis following cerebral infarction affecting left non-dominant side (HCC)  Unsteadiness on feet  Other abnormalities of gait and mobility     Problem List Patient Active Problem List   Diagnosis Date Noted  . Meralgia paraesthetica, right 08/12/2019  . 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 Gait disturbance, post-stroke 04/17/2019  . Long term (current) use of anticoagulants 12/31/2018  . Atrial fibrillation, unspecified type ([place]) 12/22/2018  . Longstanding persistent atrial fibrillation ([place]) 09/20/2018  . Functional diarrhea 07/14/2018  . Benign essential HTN   . Hyperglycemia   . ESRD on dialysis ([place]) 06/16/2018  . Mild malnutrition ([place]) 06/16/2018  . Stroke due to embolism ([place]) 06/16/2018  . CVA (cerebral vascular accident) ([place]) 06/08/2018  . CHF (congestive heart failure) ([place]) 05/08/2018  . Hyperlipidemia LDL goal <70 09/23/2017  . OAB (overactive bladder) 01/09/2016  . SUI (stress urinary incontinence), male 01/09/2016  .  Hypersomnia 10/30/2015  . Obesity (BMI 30.0-34.9) 10/30/2015  . Routine general medical examination at a health care facility 06/04/2015  . Medicare annual wellness visit, subsequent 06/04/2015  . Hereditary and idiopathic peripheral neuropathy 09/14/2014  . Rectal cancer ([place]) 02/01/2013  . Obstructive sleep apnea 07/29/2010  . GOUT 05/11/2007  . ERECTILE DYSFUNCTION, MILD 05/11/2007    Cameron Sprang, PT, MPT [facility] [address] [place], [place], [address] Phone: [phone]   Fax:  [phone] 10/28/19, 12:55 PM  Name: Jason HISSONG Sr. MRN: [id number] Date of Birth: [birth year]

## 2019-10-31 ENCOUNTER — Ambulatory Visit (INDEPENDENT_AMBULATORY_CARE_PROVIDER_SITE_OTHER): Payer: Medicare HMO | Admitting: General Practice

## 2019-10-31 ENCOUNTER — Other Ambulatory Visit: Payer: Self-pay | Admitting: General Practice

## 2019-10-31 DIAGNOSIS — Z7901 Long term (current) use of anticoagulants: Secondary | ICD-10-CM

## 2019-10-31 LAB — POCT INR: INR: 3 (ref 2.0–3.0)

## 2019-10-31 MED ORDER — ENOXAPARIN SODIUM 60 MG/0.6ML ~~LOC~~ SOLN: 60.0000 mg | SUBCUTANEOUS | 0 refills | Status: DC

## 2019-10-31 NOTE — Progress Notes (Addendum)
I have reviewed and agree  enox

## 2019-10-31 NOTE — Patient Instructions (Addendum)
Pre visit review using our clinic review tool, if applicable. No additional management support is needed unless otherwise documented below in the visit note. Continue to take 1 tablet daily except 1/2 tablet on Mon Wed and Fridays.  Please follow patient instructions.  Instructions for coumadin and Lovenox pre and post procedure on 5/18.  5/13 - Last dose of coumadin until after procedure 5/14 - Nothing (No coumadin and no Lovenox) 5/15 - Lovenox in the AM 5/16 - Lovenox in the AM 5/17 - Nothing (No Lovenox) 5/18 - Nothing (No Lovenox) 5/19 - Lovenox in the AM and 1 1/2 tablets of coumadin 5/20 - Lovenox in the AM and 1 1/2 tablets of coumadin 5/21 - Lovenox in the AM and 1 tablet of coumadin 5/22 - Lovenox in the AM and 1 tablet of coumadin 5/23 - Stop Lovenox and resume current dosage of coumadin  5/25 - Check INR in the coumadin clinic.

## 2019-11-01 ENCOUNTER — Ambulatory Visit: Payer: Medicare HMO

## 2019-11-02 ENCOUNTER — Other Ambulatory Visit: Payer: Self-pay

## 2019-11-02 ENCOUNTER — Ambulatory Visit: Payer: Medicare HMO

## 2019-11-02 DIAGNOSIS — I69354 Hemiplegia and hemiparesis following cerebral infarction affecting left non-dominant side: Secondary | ICD-10-CM

## 2019-11-02 DIAGNOSIS — M21372 Foot drop, left foot: Secondary | ICD-10-CM

## 2019-11-02 DIAGNOSIS — R2689 Other abnormalities of gait and mobility: Secondary | ICD-10-CM

## 2019-11-02 DIAGNOSIS — R2681 Unsteadiness on feet: Secondary | ICD-10-CM | POA: Diagnosis not present

## 2019-11-02 DIAGNOSIS — M6281 Muscle weakness (generalized): Secondary | ICD-10-CM

## 2019-11-02 NOTE — Therapy (Signed)
[facility] [address] [place], [place], [address] Phone: [phone]   Fax:  [phone]  Physical Therapy Treatment  Patient Details  Name: Jason BOGNAR Sr. MRN: [id number] Date of Birth: August 14, 1945 Referring Provider (PT): [name], MD   Encounter Date: 11/02/2019  PT End of Session - 11/02/19 1328    Visit Number  4    Number of Visits  17    Date for PT Re-Evaluation  12/16/19    Authorization Type  [employer] medicare    Progress Note Due on Visit  10    PT Start Time  1231    PT Stop Time  1313    PT Time Calculation (min)  42 min    Equipment Utilized During Treatment  Gait belt    Activity Tolerance  Patient tolerated treatment well    Behavior During Therapy  WFL for tasks assessed/performed       Past Medical History:  Diagnosis Date  . Anemia   . Arthritis   . Chronic renal insufficiency 07/31/2011  . COPD (chronic obstructive pulmonary disease) ([place])    pt denies  . CVA (cerebral vascular accident) ([place]) 06/2018  . Deficiency anemia 07/20/2012  . Diverticulosis   . Edema 09/21/2010   Qualifier: Diagnosis of  By: Jerold Coombe    . ERECTILE DYSFUNCTION, MILD 05/11/2007   Qualifier: Diagnosis of  By: Sherren Mocha MD, Jory Ee   . Exertional dyspnea 10/30/2015  . GOUT 05/11/2007   Qualifier: Diagnosis of  By: Sherren Mocha MD, Jory Ee   . Hereditary and idiopathic peripheral neuropathy 09/14/2014  . History of radiation therapy 02/21/13-03/31/13   rectum 50.4Gy total dose  . Hypersomnia 10/30/2015  . Hypertension   . Lung nodule 10/30/2015  . OAB (overactive bladder) 01/09/2016  . Obesity (BMI 30.0-34.9) 10/30/2015  . Obstructive sleep apnea 07/29/2010   HST 11/2015 AHI 54.  On autocpap  >> 10 cm   . Peripheral edema 09/19/2013  . rectal ca dx'd 02/01/13   rectal. Radiation and chemotherapy- remains on chemotherapy-next tx. 08-22-13.   . S/P partial lobectomy of lung 11/20/2015  . S/P thoracotomy   . SUI (stress  urinary incontinence), male 01/09/2016    Past Surgical History:  Procedure Laterality Date  . A/V FISTULAGRAM Left 06/14/2018   Procedure: A/V FISTULAGRAM;  Surgeon: Waynetta Sandy, MD;  Location: [facility];  Service: Cardiovascular;  Laterality: Left;  . AV FISTULA PLACEMENT Left 03/24/2016   Procedure: CREATION OF LEFT BRACIOCEPHALIC ARTERIOVENOUS (AV) FISTULA;  Surgeon: Elam Dutch, MD;  Location: [facility];  Service: Vascular;  Laterality: Left;  . BIOPSY  09/17/2018   Procedure: BIOPSY;  Surgeon: Doran Stabler, MD;  Location: [facility];  Service: Gastroenterology;;  . BIOPSY  03/09/2019   Procedure: BIOPSY;  Surgeon: Doran Stabler, MD;  Location: [facility];  Service: Gastroenterology;;  . COLON SURGERY  05/20/2013  . COLONOSCOPY  2010   [facility]  . COLONOSCOPY WITH PROPOFOL N/A 09/17/2018   Procedure: COLONOSCOPY WITH PROPOFOL;  Surgeon: Doran Stabler, MD;  Location: [facility];  Service: Gastroenterology;  Laterality: N/A;  . ESOPHAGOGASTRODUODENOSCOPY (EGD) WITH PROPOFOL N/A 09/17/2018   Procedure: ESOPHAGOGASTRODUODENOSCOPY (EGD) WITH PROPOFOL;  Surgeon: Doran Stabler, MD;  [facility]: [facility];  Service: Gastroenterology;  Laterality: N/A;  . ESOPHAGOGASTRODUODENOSCOPY (EGD) WITH PROPOFOL N/A 03/09/2019   Procedure: ESOPHAGOGASTRODUODENOSCOPY (EGD) WITH PROPOFOL;  Surgeon: Doran Stabler, MD;  Location: [facility];  Service: Gastroenterology;  Laterality: N/A;  . EUS N/A 02/03/2013   Procedure: LOWER ENDOSCOPIC ULTRASOUND (EUS);  Surgeon: Milus Banister, MD;  Location: [facility];  Service: Endoscopy;  Laterality: N/A;  . ILEOSTOMY CLOSURE N/A 08/24/2013   Procedure: CLOSURE OF LOOP ILEOSTOMY ;  Surgeon: Leighton Ruff, MD;  [facility]: [facility];  Service: General;  Laterality: N/A;  . IR ANGIO VERTEBRAL SEL SUBCLAVIAN INNOMINATE BILAT MOD SED  06/08/2018  . IR CT HEAD LTD  06/08/2018  . IR INTRAVSC STENT CERV CAROTID W/O EMB-PROT MOD  SED INC ANGIO  06/08/2018  . IR PERCUTANEOUS ART THROMBECTOMY/INFUSION INTRACRANIAL INC DIAG ANGIO  06/08/2018  . LAPAROSCOPIC LOW ANTERIOR RESECTION N/A 05/20/2013   Procedure: LAPAROSCOPIC LOW ANTERIOR RESECTION WITH SPLENIC FLEXURE MOBILIZATION;  Surgeon: Leighton Ruff, MD;  Location: [facility];  Service: General;  Laterality: N/A;  . OSTOMY N/A 05/20/2013   Procedure: diverting OSTOMY;  Surgeon: Leighton Ruff, MD;  Location: [facility];  Service: General;  Laterality: N/A;  . PERIPHERAL VASCULAR INTERVENTION Left 06/14/2018   Procedure: PERIPHERAL VASCULAR INTERVENTION;  Surgeon: Waynetta Sandy, MD;  Location: [facility];  Service: Cardiovascular;  Laterality: Left;  AVF on the Left  . RADIOLOGY WITH ANESTHESIA N/A 06/08/2018   Procedure: IR WITH ANESTHESIA;  Surgeon: Radiologist, Medication, MD;  Location: [facility];  Service: Radiology;  Laterality: N/A;  . VIDEO ASSISTED THORACOSCOPY (VATS)/WEDGE RESECTION Left 11/20/2015   Procedure: VIDEO ASSISTED THORACOSCOPY (VATS)/LUNG RESECTION;  Surgeon: [name], MD;  Location: [facility];  Service: Thoracic;  Laterality: Left;  Marland Kitchen VIDEO BRONCHOSCOPY N/A 11/20/2015   Procedure: VIDEO BRONCHOSCOPY;  Surgeon: [name], MD;  Location: [facility];  Service: Thoracic;  Laterality: N/A;    There were no vitals filed for this visit.  Subjective Assessment - 11/02/19 1232    Subjective  Patient reports no new changes/issues since last visit. No falls. Reports HEP is going well at home.    Pertinent History  HTN, dialysis (T, TH, S), rectal cancer, CHF, CVA on 07/05/18 ([facility])    Limitations  Walking;Standing;House hold activities    Patient Stated Goals  "Go back fishing"    Currently in Pain?  No/denies                       [facility] Adult PT Treatment/Exercise - 11/02/19 1256      Transfers   Transfers  Sit to Stand;Stand to Sit    Sit to Stand  6: Modified independent (Device/Increase time)    Stand to Sit  6: Modified  independent (Device/Increase time);With upper extremity assist;With armrests    Comments  completed x 10 reps, with focus on eccentric control      Ambulation/Gait   Ambulation/Gait  Yes    Ambulation/Gait Assistance  5: Supervision;4: Min guard    Ambulation/Gait Assistance Details  Completed initial 2 laps around therapy gym w/o AFO donned. Completed 2 laps around therapy gym with L [place] PLS AFO with lateral strut donned for improved toe clearance and ankle stability. Further progressed gait activities to include outdoor surfaces including grass and pavement. AFO donned with ambulation outdoors. Patient still demonstrate improve toe clearance however one instance of toe catching on grass surfaces. Required increased supervision and CGA for steadying as needed with ambulation. With increasing distance with outdoor ambulation patient demos weakness in LLE. With all gait activites, patient repots no instances of pain/discomfort with AFO donned.     Ambulation Distance (  Feet)  460 Feet   1200' outside   Assistive device  None    Gait Pattern  Decreased dorsiflexion - left;Decreased weight shift to left;Decreased stance time - left;Decreased step length - right;Decreased step length - left;Poor foot clearance - left    Ambulation Surface  Level;Indoor;Outdoor;Unlevel;Paved;Grass      Neuro Re-ed    Neuro Re-ed Details   Completed forward step ups to 6' step leading with LLE, 2 x 10 reps. Completed w/o UE support and CGA from PT. Verbal cues for improved foot placement needed. Completed standing on foam beam w/o UE support 3 x 1 min each. Increased sway noted with UE support removed. Patient did require UE support and assistance from PT to get placement on foam beam.       Exercises   Exercises  Knee/Hip      Knee/Hip Exercises: Sidelying   Hip ABduction  Strengthening;Both;2 sets;10 reps    Hip ABduction Limitations  patient requiring increased tactile/verbal cues for form to improve sidelying  and improved knee extension with completion. Patient wanting to compensate by utilzing hip flexors due to decreased hip abductor weakness.                PT Short Term Goals - 10/17/19 1543      PT SHORT TERM GOAL #1   Title  The patient will be indep with HEP for LE strengthening, balance and general mobility. (Target Date: 11/16/19)    Time  4    Period  Weeks    Status  New    Target Date  11/16/19      PT SHORT TERM GOAL #2   Title  Pt will improve FGA to >/=20/30 in order to indicate decreased fall risk.    Time  4    Period  Weeks    Status  New      PT SHORT TERM GOAL #3   Title  Pt will improve gait speed to >/=3.40 ft/sec w/ AFO in order to indicate more normal gait speed.    Time  4    Period  Weeks    Status  New      PT SHORT TERM GOAL #4   Title  Pt will perform 5TSS </=11.5 secs without UE support in order to indicate decreased fall risk.    Time  4    Period  Weeks    Status  New        PT Long Term Goals - 10/17/19 1545      PT LONG TERM GOAL #1   Title  The patient will be indep with progression of HEP for post d/c exercise. (Target Date: 12/16/19)    Time  8    Period  Weeks    Status  New    Target Date  12/16/19      PT LONG TERM GOAL #2   Title  Pt will improve FGA to >/=23/30 in order to decrease fall risk.    Time  8    Period  Weeks    Status  New      PT LONG TERM GOAL #3   Title  Pt will ambulate x 1000' over varying outdoor surfaces without device with AFO at mod I level in order to return to leisure activiites.    Time  8    Period  Weeks    Status  New      PT LONG TERM GOAL #4   Title  Pt will simulate casting fishing line without LOB in order to return to leisure activity.    Time  8    Status  New            Plan - 11/02/19 1328    Clinical Impression Statement  Today's skilled session focused on continued gait training with AFO. L [place] PLS AFO with lateral strut donned with gait activities throughout session  today. Completed gait training over outdoor surfaces including pavement and grass, patient demonstrates improved toe clearanace and gait pattern with AFO, however still demonstrates some unsteadiness with unlevel surfaces. Gerald Stabs from [facility] is scheduled to be present at 4/30 appointment. Pt will benefit from continued PT services to address balance, gait, and improve overal functional mobility.    Personal Factors and Comorbidities  Age;Comorbidity 3+;Time since onset of injury/illness/exacerbation    Examination-Activity Limitations  Lift;Stand;Stairs;Squat;Locomotion Level    Examination-Participation Restrictions  Community Activity;Yard Work;Other   fishing   Stability/Clinical Decision Making  Stable/Uncomplicated    Rehab Potential  Good    PT Frequency  2x / week    PT Duration  8 weeks    PT Treatment/Interventions  ADLs/Self Care Home Management;Aquatic Therapy;DME Instruction;Gait training;Stair training;Functional mobility training;Therapeutic activities;Therapeutic exercise;Balance training;Neuromuscular re-education;Patient/family education;Orthotic Fit/Training;Passive range of motion;Vestibular    PT Next Visit Plan  Continued balance and gait training    PT Home Exercise Plan  see patient instructions for initial HEP access code    Consulted and Agree with Plan of Care  Patient       Patient will benefit from skilled therapeutic intervention in order to improve the following deficits and impairments:  Abnormal gait, Decreased balance, Decreased activity tolerance, Decreased endurance, Decreased knowledge of use of DME, Decreased mobility, Decreased range of motion, Decreased strength, Impaired flexibility, Postural dysfunction  Visit Diagnosis: Left foot drop  Hemiplegia and hemiparesis following cerebral infarction affecting left non-dominant side (HCC)  Muscle weakness (generalized)  Unsteadiness on feet  Other abnormalities of gait and mobility     Problem  List Patient Active Problem List   Diagnosis Date Noted  . Meralgia paraesthetica, right 08/12/2019  . 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 Gait disturbance, post-stroke 04/17/2019  . Long term (current) use of anticoagulants 12/31/2018  . Atrial fibrillation, unspecified type ([facility]) 12/22/2018  . Longstanding persistent atrial fibrillation ([place]) 09/20/2018  . Functional diarrhea 07/14/2018  . Benign essential HTN   . Hyperglycemia   . ESRD on dialysis ([place]) 06/16/2018  . Mild malnutrition ([place]) 06/16/2018  . Stroke due to embolism ([place]) 06/16/2018  . CVA (cerebral vascular accident) ([place]) 06/08/2018  . CHF (congestive heart failure) ([place]) 05/08/2018  . Hyperlipidemia LDL goal <70 09/23/2017  . OAB (overactive bladder) 01/09/2016  . SUI (stress urinary incontinence), male 01/09/2016  . Hypersomnia 10/30/2015  . Obesity (BMI 30.0-34.9) 10/30/2015  . Routine general medical examination at a health care facility 06/04/2015  . Medicare annual wellness visit, subsequent 06/04/2015  . Hereditary and idiopathic peripheral neuropathy 09/14/2014  . Rectal cancer ([place]) 02/01/2013  . Obstructive sleep apnea 07/29/2010  . GOUT 05/11/2007  . ERECTILE DYSFUNCTION, MILD 05/11/2007    Jones Bales, PT, DPT 11/02/2019, 1:33 PM  [facility] [address] [place], [place], [address] Phone: [phone]   Fax:  [phone]  Name: Jason GAVIN Sr. MRN: [id number] Date of Birth: [birth year]

## 2019-11-02 NOTE — Telephone Encounter (Signed)
We have cleared everything up with Jason and Jason [name].  They know he is to bridge with Lovenox. He has a follow up with [facility] cardiology on 11-16-2019. His pre-visit is here on 11-14-2019.

## 2019-11-04 ENCOUNTER — Other Ambulatory Visit: Payer: Self-pay

## 2019-11-04 ENCOUNTER — Ambulatory Visit: Payer: Medicare HMO

## 2019-11-04 DIAGNOSIS — M6281 Muscle weakness (generalized): Secondary | ICD-10-CM

## 2019-11-04 DIAGNOSIS — Z992 Dependence on renal dialysis: Secondary | ICD-10-CM | POA: Diagnosis not present

## 2019-11-04 DIAGNOSIS — M21372 Foot drop, left foot: Secondary | ICD-10-CM

## 2019-11-04 DIAGNOSIS — R2681 Unsteadiness on feet: Secondary | ICD-10-CM

## 2019-11-04 DIAGNOSIS — N186 End stage renal disease: Secondary | ICD-10-CM | POA: Diagnosis not present

## 2019-11-04 DIAGNOSIS — I69354 Hemiplegia and hemiparesis following cerebral infarction affecting left non-dominant side: Secondary | ICD-10-CM | POA: Diagnosis not present

## 2019-11-04 DIAGNOSIS — R2689 Other abnormalities of gait and mobility: Secondary | ICD-10-CM

## 2019-11-04 DIAGNOSIS — I129 Hypertensive chronic kidney disease with stage 1 through stage 4 chronic kidney disease, or unspecified chronic kidney disease: Secondary | ICD-10-CM | POA: Diagnosis not present

## 2019-11-04 NOTE — Therapy (Signed)
[facility] [address] [place], [place], [address] Phone: [phone]   Fax:  [phone]  Physical Therapy Treatment  Patient Details  Name: Jason BOHORQUEZ Sr. MRN: [id number] Date of Birth: Dec 03, 1945 Referring Provider (PT): [name], MD   Encounter Date: 11/04/2019  PT End of Session - 11/04/19 0930    Visit Number  5    Number of Visits  17    Date for PT Re-Evaluation  12/16/19    Authorization Type  [employer] medicare    Progress Note Due on Visit  10    PT Start Time  0929    PT Stop Time  1013    PT Time Calculation (min)  44 min    Equipment Utilized During Treatment  Gait belt    Activity Tolerance  Patient tolerated treatment well    Behavior During Therapy  WFL for tasks assessed/performed       Past Medical History:  Diagnosis Date  . Anemia   . Arthritis   . Chronic renal insufficiency 07/31/2011  . COPD (chronic obstructive pulmonary disease) ([place])    pt denies  . CVA (cerebral vascular accident) ([place]) 06/2018  . Deficiency anemia 07/20/2012  . Diverticulosis   . Edema 09/21/2010   Qualifier: Diagnosis of  By: Jerold Coombe    . ERECTILE DYSFUNCTION, MILD 05/11/2007   Qualifier: Diagnosis of  By: Sherren Mocha MD, Jory Ee   . Exertional dyspnea 10/30/2015  . GOUT 05/11/2007   Qualifier: Diagnosis of  By: Sherren Mocha MD, Jory Ee   . Hereditary and idiopathic peripheral neuropathy 09/14/2014  . History of radiation therapy 02/21/13-03/31/13   rectum 50.4Gy total dose  . Hypersomnia 10/30/2015  . Hypertension   . Lung nodule 10/30/2015  . OAB (overactive bladder) 01/09/2016  . Obesity (BMI 30.0-34.9) 10/30/2015  . Obstructive sleep apnea 07/29/2010   HST 11/2015 AHI 54.  On autocpap  >> 10 cm   . Peripheral edema 09/19/2013  . rectal ca dx'd 02/01/13   rectal. Radiation and chemotherapy- remains on chemotherapy-next tx. 08-22-13.   . S/P partial lobectomy of lung 11/20/2015  . S/P thoracotomy   . SUI (stress  urinary incontinence), male 01/09/2016    Past Surgical History:  Procedure Laterality Date  . A/V FISTULAGRAM Left 06/14/2018   Procedure: A/V FISTULAGRAM;  Surgeon: Waynetta Sandy, MD;  Location: [facility];  Service: Cardiovascular;  Laterality: Left;  . AV FISTULA PLACEMENT Left 03/24/2016   Procedure: CREATION OF LEFT BRACIOCEPHALIC ARTERIOVENOUS (AV) FISTULA;  Surgeon: Elam Dutch, MD;  Location: [facility];  Service: Vascular;  Laterality: Left;  . BIOPSY  09/17/2018   Procedure: BIOPSY;  Surgeon: Doran Stabler, MD;  Location: [facility];  Service: Gastroenterology;;  . BIOPSY  03/09/2019   Procedure: BIOPSY;  Surgeon: Doran Stabler, MD;  Location: [facility];  Service: Gastroenterology;;  . COLON SURGERY  05/20/2013  . COLONOSCOPY  2010   [facility]  . COLONOSCOPY WITH PROPOFOL N/A 09/17/2018   Procedure: COLONOSCOPY WITH PROPOFOL;  Surgeon: Doran Stabler, MD;  Location: [facility];  Service: Gastroenterology;  Laterality: N/A;  . ESOPHAGOGASTRODUODENOSCOPY (EGD) WITH PROPOFOL N/A 09/17/2018   Procedure: ESOPHAGOGASTRODUODENOSCOPY (EGD) WITH PROPOFOL;  Surgeon: Doran Stabler, MD;  Location: [facility];  Service: Gastroenterology;  Laterality: N/A;  . ESOPHAGOGASTRODUODENOSCOPY (EGD) WITH PROPOFOL N/A 03/09/2019   Procedure: ESOPHAGOGASTRODUODENOSCOPY (EGD) WITH PROPOFOL;  Surgeon: Doran Stabler, MD;  Location: [facility];  Service: Gastroenterology;  Laterality: N/A;  . EUS N/A 02/03/2013   Procedure: LOWER ENDOSCOPIC ULTRASOUND (EUS);  Surgeon: Milus Banister, MD;  Location: [facility];  Service: Endoscopy;  Laterality: N/A;  . ILEOSTOMY CLOSURE N/A 08/24/2013   Procedure: CLOSURE OF LOOP ILEOSTOMY ;  Surgeon: Leighton Ruff, MD;  Location: [facility];  Service: General;  Laterality: N/A;  . IR ANGIO VERTEBRAL SEL SUBCLAVIAN INNOMINATE BILAT MOD SED  06/08/2018  . IR CT HEAD LTD  06/08/2018  . IR INTRAVSC STENT CERV CAROTID W/O EMB-PROT MOD  SED INC ANGIO  06/08/2018  . IR PERCUTANEOUS ART THROMBECTOMY/INFUSION INTRACRANIAL INC DIAG ANGIO  06/08/2018  . LAPAROSCOPIC LOW ANTERIOR RESECTION N/A 05/20/2013   Procedure: LAPAROSCOPIC LOW ANTERIOR RESECTION WITH SPLENIC FLEXURE MOBILIZATION;  Surgeon: Leighton Ruff, MD;  Location: [facility];  Service: General;  Laterality: N/A;  . OSTOMY N/A 05/20/2013   Procedure: diverting OSTOMY;  Surgeon: Leighton Ruff, MD;  Location: [facility];  Service: General;  Laterality: N/A;  . PERIPHERAL VASCULAR INTERVENTION Left 06/14/2018   Procedure: PERIPHERAL VASCULAR INTERVENTION;  Surgeon: Waynetta Sandy, MD;  Location: [facility];  Service: Cardiovascular;  Laterality: Left;  AVF on the Left  . RADIOLOGY WITH ANESTHESIA N/A 06/08/2018   Procedure: IR WITH ANESTHESIA;  Surgeon: Radiologist, Medication, MD;  Location: [facility];  Service: Radiology;  Laterality: N/A;  . VIDEO ASSISTED THORACOSCOPY (VATS)/WEDGE RESECTION Left 11/20/2015   Procedure: VIDEO ASSISTED THORACOSCOPY (VATS)/LUNG RESECTION;  Surgeon: [name], MD;  Location: [facility];  Service: Thoracic;  Laterality: Left;  Marland Kitchen VIDEO BRONCHOSCOPY N/A 11/20/2015   Procedure: VIDEO BRONCHOSCOPY;  Surgeon: [name], MD;  Location: [facility];  Service: Thoracic;  Laterality: N/A;    There were no vitals filed for this visit.  Subjective Assessment - 11/04/19 0929    Subjective  Patient reports no new complaints. No falls and no instances of catching that left toe.    Pertinent History  HTN, dialysis (T, TH, S), rectal cancer, CHF, CVA on 07/05/18 ([facility])    Limitations  Walking;Standing;House hold activities    Patient Stated Goals  "Go back fishing"    Currently in Pain?  No/denies                       [facility] Adult PT Treatment/Exercise - 11/04/19 0942      Transfers   Transfers  Sit to Stand;Stand to Sit    Sit to Stand  6: Modified independent (Device/Increase time)    Stand to Sit  6: Modified independent  (Device/Increase time);With upper extremity assist;With armrests    Comments  completed 2 x 10 reps, with left lower extremity placed posterior for further improvements in strengthening.       Ambulation/Gait   Ambulation/Gait  Yes    Ambulation/Gait Assistance  5: Supervision    Ambulation/Gait Assistance Details  Completed all ambulation with Orthotist present. Completed 115 with AFO donned. Without AFO donned patient demonstrates increased foot slap. Completed following 335 ft with L [place] AFO with medial strut donned, orthotist suggests this may be a better option for patient instead of L [place] PLS AFO after assessment. Patient demonstrating improved toe clerance and decreased foot flap with ambulation. Patient reports that he believes this is more comfortable than the L [place] PLS AFO. Orthotist believe the o[place]ok options is less stiff and allow for more natural gait while still allowing for control with DF. Ambulated 1200 feet outdoors with  AFO donned, patient demonstrating fatigue with increasing distance ambulated but conitnues to demonstrate proper gait pattern.    Ambulation Distance (Feet)  450 Feet   1200 outdoors   Assistive device  None    Gait Pattern  Decreased dorsiflexion - left;Decreased weight shift to left;Decreased stance time - left;Decreased step length - right;Decreased step length - left;Poor foot clearance - left    Ambulation Surface  Level;Indoor;Outdoor;Paved;Grass    Stairs  Yes    Stairs Assistance  5: Supervision;4: Min guard    Stairs Assistance Details (indicate cue type and reason)  Stairs for LLE strengthening, completed intiially with single rail in recriprocal pattern. Progressed to no rails for further challenge, patient require CGA.     Stair Management Technique  Alternating pattern;Forwards;One rail Right;No rails    Number of Stairs  12    Height of Stairs  6             PT Education - 11/04/19 1405    Education Details  PT and Orthotist  providing education regarding AFO Brace    Person(s) Educated  Patient    Methods  Explanation    Comprehension  Verbalized understanding       PT Short Term Goals - 10/17/19 1543      PT SHORT TERM GOAL #1   Title  The patient will be indep with HEP for LE strengthening, balance and general mobility. (Target Date: 11/16/19)    Time  4    Period  Weeks    Status  New    Target Date  11/16/19      PT SHORT TERM GOAL #2   Title  Pt will improve FGA to >/=20/30 in order to indicate decreased fall risk.    Time  4    Period  Weeks    Status  New      PT SHORT TERM GOAL #3   Title  Pt will improve gait speed to >/=3.40 ft/sec w/ AFO in order to indicate more normal gait speed.    Time  4    Period  Weeks    Status  New      PT SHORT TERM GOAL #4   Title  Pt will perform 5TSS </=11.5 secs without UE support in order to indicate decreased fall risk.    Time  4    Period  Weeks    Status  New        PT Long Term Goals - 10/17/19 1545      PT LONG TERM GOAL #1   Title  The patient will be indep with progression of HEP for post d/c exercise. (Target Date: 12/16/19)    Time  8    Period  Weeks    Status  New    Target Date  12/16/19      PT LONG TERM GOAL #2   Title  Pt will improve FGA to >/=23/30 in order to decrease fall risk.    Time  8    Period  Weeks    Status  New      PT LONG TERM GOAL #3   Title  Pt will ambulate x 1000' over varying outdoor surfaces without device with AFO at mod I level in order to return to leisure activiites.    Time  8    Period  Weeks    Status  New      PT LONG TERM GOAL #4   Title  Pt will  simulate casting fishing line without LOB in order to return to leisure activity.    Time  8    Status  New            Plan - 11/04/19 1407    Clinical Impression Statement  In today's skilled PT session, orthotist from [facility] was present to allow for further gait assessment and determination of proper AFO due to current gait  abnormalities. At this time, PT and orthotist believe that the L o[place] AFO with medial strut is the best AFO option at this time to help reduce foot slap and improve overall gait mechanics. Patient reporting that he agrees with this decision, and the AFO is comfortable with ambulation. Orthotist will go forward with getting the brace and hopes to arrive in 2-3 weeks. Will continue to gait train with sample AFO in clinic until patient recieves his.    Personal Factors and Comorbidities  Age;Comorbidity 3+;Time since onset of injury/illness/exacerbation    Examination-Activity Limitations  Lift;Stand;Stairs;Squat;Locomotion Level    Examination-Participation Restrictions  Community Activity;Yard Work;Other   fishing   Stability/Clinical Decision Making  Stable/Uncomplicated    Rehab Potential  Good    PT Frequency  2x / week    PT Duration  8 weeks    PT Treatment/Interventions  ADLs/Self Care Home Management;Aquatic Therapy;DME Instruction;Gait training;Stair training;Functional mobility training;Therapeutic activities;Therapeutic exercise;Balance training;Neuromuscular re-education;Patient/family education;Orthotic Fit/Training;Passive range of motion;Vestibular    PT Next Visit Plan  Continued balance and gait training    PT Home Exercise Plan  see patient instructions for initial HEP access code    Consulted and Agree with Plan of Care  Patient       Patient will benefit from skilled therapeutic intervention in order to improve the following deficits and impairments:  Abnormal gait, Decreased balance, Decreased activity tolerance, Decreased endurance, Decreased knowledge of use of DME, Decreased mobility, Decreased range of motion, Decreased strength, Impaired flexibility, Postural dysfunction  Visit Diagnosis: Left foot drop  Muscle weakness (generalized)  Unsteadiness on feet  Other abnormalities of gait and mobility     Problem List Patient Active Problem List   Diagnosis Date  Noted  . Meralgia paraesthetica, right 08/12/2019  . 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 Gait disturbance, post-stroke 04/17/2019  . Long term (current) use of anticoagulants 12/31/2018  . Atrial fibrillation, unspecified type ([place]) 12/22/2018  . Longstanding persistent atrial fibrillation ([place]) 09/20/2018  . Functional diarrhea 07/14/2018  . Benign essential HTN   . Hyperglycemia   . ESRD on dialysis ([place]) 06/16/2018  . Mild malnutrition ([place]) 06/16/2018  . Stroke due to embolism ([place]) 06/16/2018  . CVA (cerebral vascular accident) ([place]) 06/08/2018  . CHF (congestive heart failure) ([place]) 05/08/2018  . Hyperlipidemia LDL goal <70 09/23/2017  . OAB (overactive bladder) 01/09/2016  . SUI (stress urinary incontinence), male 01/09/2016  . Hypersomnia 10/30/2015  . Obesity (BMI 30.0-34.9) 10/30/2015  . Routine general medical examination at a health care facility 06/04/2015  . Medicare annual wellness visit, subsequent 06/04/2015  . Hereditary and idiopathic peripheral neuropathy 09/14/2014  . Rectal cancer ([place]) 02/01/2013  . Obstructive sleep apnea 07/29/2010  . GOUT 05/11/2007  . ERECTILE DYSFUNCTION, MILD 05/11/2007    Jones Bales, PT, DPT 11/04/2019, 2:11 PM  [facility] [address] [name], [place], [address] Phone: [phone]   Fax:  [phone]  Name: Jason CANNATA Sr. MRN: [id number] Date of Birth: [birth year]

## 2019-11-05 DIAGNOSIS — N2581 Secondary hyperparathyroidism of renal origin: Secondary | ICD-10-CM | POA: Diagnosis not present

## 2019-11-05 DIAGNOSIS — E876 Hypokalemia: Secondary | ICD-10-CM | POA: Diagnosis not present

## 2019-11-05 DIAGNOSIS — R197 Diarrhea, unspecified: Secondary | ICD-10-CM | POA: Diagnosis not present

## 2019-11-05 DIAGNOSIS — Z992 Dependence on renal dialysis: Secondary | ICD-10-CM | POA: Diagnosis not present

## 2019-11-05 DIAGNOSIS — R52 Pain, unspecified: Secondary | ICD-10-CM | POA: Diagnosis not present

## 2019-11-05 DIAGNOSIS — D509 Iron deficiency anemia, unspecified: Secondary | ICD-10-CM | POA: Diagnosis not present

## 2019-11-05 DIAGNOSIS — N186 End stage renal disease: Secondary | ICD-10-CM | POA: Diagnosis not present

## 2019-11-08 ENCOUNTER — Telehealth: Payer: Self-pay | Admitting: Gastroenterology

## 2019-11-08 ENCOUNTER — Ambulatory Visit: Payer: Medicare HMO

## 2019-11-08 ENCOUNTER — Ambulatory Visit: Payer: Self-pay | Admitting: Cardiology

## 2019-11-08 NOTE — Telephone Encounter (Signed)
Thank you for getting Dr. Posey Pronto of [facility] on the phone today so I could discuss this patient's dialysis schedule the week of his endoscopic procedure.  Dr. Posey Pronto will adjust Mr. Zwahlen dialysis schedule the week of May 17 to allow for the upper endoscopy scheduled for May 18.  In addition, I spoke with this patient's wife when she came for a procedure yesterday.  She told me that Amon has a recurrence of his diarrhea.  On further questioning, it does not sound like he is taking his pancreatic enzymes.  As we have experienced before, she seemed somewhat confused about which medicine that was and whether or not he should be taking it.  He indicated a recent office visit that he was taking it, but it looks to me like perhaps it has not been filled for some time.  I wrote down the name of the medicine for her, asked her to check at home and make sure they have it and that it is in his daily pill container.  These call his wife [name] and check on that.  Let me know if a new prescription is needed.  - HD

## 2019-11-09 ENCOUNTER — Other Ambulatory Visit: Payer: Self-pay

## 2019-11-09 ENCOUNTER — Ambulatory Visit: Payer: Medicare HMO | Attending: Internal Medicine

## 2019-11-09 DIAGNOSIS — M21372 Foot drop, left foot: Secondary | ICD-10-CM | POA: Diagnosis not present

## 2019-11-09 DIAGNOSIS — R2689 Other abnormalities of gait and mobility: Secondary | ICD-10-CM | POA: Diagnosis not present

## 2019-11-09 DIAGNOSIS — R2681 Unsteadiness on feet: Secondary | ICD-10-CM | POA: Insufficient documentation

## 2019-11-09 DIAGNOSIS — M6281 Muscle weakness (generalized): Secondary | ICD-10-CM | POA: Diagnosis not present

## 2019-11-09 DIAGNOSIS — I69354 Hemiplegia and hemiparesis following cerebral infarction affecting left non-dominant side: Secondary | ICD-10-CM | POA: Diagnosis not present

## 2019-11-09 NOTE — Therapy (Signed)
[facility] [address] [place], [place], [address] Phone: [phone]   Fax:  [phone]  Physical Therapy Treatment  Patient Details  Name: Jason COKER Sr. MRN: [id number] Date of Birth: 1945-09-18 Referring Provider (PT): [name], MD   Encounter Date: 11/09/2019  PT End of Session - 11/09/19 1148    Visit Number  6    Number of Visits  17    Date for PT Re-Evaluation  12/16/19    Authorization Type  [employer] medicare    Progress Note Due on Visit  10    PT Start Time  3329    PT Stop Time  1228    PT Time Calculation (min)  43 min    Equipment Utilized During Treatment  Gait belt    Activity Tolerance  Patient tolerated treatment well    Behavior During Therapy  WFL for tasks assessed/performed       Past Medical History:  Diagnosis Date  . Anemia   . Arthritis   . Chronic renal insufficiency 07/31/2011  . COPD (chronic obstructive pulmonary disease) ([place])    pt denies  . CVA (cerebral vascular accident) ([place]) 06/2018  . Deficiency anemia 07/20/2012  . Diverticulosis   . Edema 09/21/2010   Qualifier: Diagnosis of  By: Jerold Coombe    . ERECTILE DYSFUNCTION, MILD 05/11/2007   Qualifier: Diagnosis of  By: Sherren Mocha MD, Jory Ee   . Exertional dyspnea 10/30/2015  . GOUT 05/11/2007   Qualifier: Diagnosis of  By: Sherren Mocha MD, Jory Ee   . Hereditary and idiopathic peripheral neuropathy 09/14/2014  . History of radiation therapy 02/21/13-03/31/13   rectum 50.4Gy total dose  . Hypersomnia 10/30/2015  . Hypertension   . Lung nodule 10/30/2015  . OAB (overactive bladder) 01/09/2016  . Obesity (BMI 30.0-34.9) 10/30/2015  . Obstructive sleep apnea 07/29/2010   HST 11/2015 AHI 54.  On autocpap  >> 10 cm   . Peripheral edema 09/19/2013  . rectal ca dx'd 02/01/13   rectal. Radiation and chemotherapy- remains on chemotherapy-next tx. 08-22-13.   . S/P partial lobectomy of lung 11/20/2015  . S/P thoracotomy   . SUI (stress  urinary incontinence), male 01/09/2016    Past Surgical History:  Procedure Laterality Date  . A/V FISTULAGRAM Left 06/14/2018   Procedure: A/V FISTULAGRAM;  Surgeon: Waynetta Sandy, MD;  Location: [facility];  Service: Cardiovascular;  Laterality: Left;  . AV FISTULA PLACEMENT Left 03/24/2016   Procedure: CREATION OF LEFT BRACIOCEPHALIC ARTERIOVENOUS (AV) FISTULA;  Surgeon: Elam Dutch, MD;  Location: [facility];  Service: Vascular;  Laterality: Left;  . BIOPSY  09/17/2018   Procedure: BIOPSY;  Surgeon: Doran Stabler, MD;  Location: [facility];  Service: Gastroenterology;;  . BIOPSY  03/09/2019   Procedure: BIOPSY;  Surgeon: Doran Stabler, MD;  Location: [facility];  Service: Gastroenterology;;  . COLON SURGERY  05/20/2013  . COLONOSCOPY  2010   [facility]  . COLONOSCOPY WITH PROPOFOL N/A 09/17/2018   Procedure: COLONOSCOPY WITH PROPOFOL;  Surgeon: Doran Stabler, MD;  Location: [facility];  Service: Gastroenterology;  Laterality: N/A;  . ESOPHAGOGASTRODUODENOSCOPY (EGD) WITH PROPOFOL N/A 09/17/2018   Procedure: ESOPHAGOGASTRODUODENOSCOPY (EGD) WITH PROPOFOL;  Surgeon: Doran Stabler, MD;  Location: [facility];  Service: Gastroenterology;  Laterality: N/A;  . ESOPHAGOGASTRODUODENOSCOPY (EGD) WITH PROPOFOL N/A 03/09/2019   Procedure: ESOPHAGOGASTRODUODENOSCOPY (EGD) WITH PROPOFOL;  Surgeon: Doran Stabler, MD;  Location: [facility];  Service: Gastroenterology;  Laterality: N/A;  . EUS N/A 02/03/2013   Procedure: LOWER ENDOSCOPIC ULTRASOUND (EUS);  Surgeon: Milus Banister, MD;  Location: [facility];  Service: Endoscopy;  Laterality: N/A;  . ILEOSTOMY CLOSURE N/A 08/24/2013   Procedure: CLOSURE OF LOOP ILEOSTOMY ;  Surgeon: Leighton Ruff, MD;  Location: [facility];  Service: General;  Laterality: N/A;  . IR ANGIO VERTEBRAL SEL SUBCLAVIAN INNOMINATE BILAT MOD SED  06/08/2018  . IR CT HEAD LTD  06/08/2018  . IR INTRAVSC STENT CERV CAROTID W/O EMB-PROT MOD  SED INC ANGIO  06/08/2018  . IR PERCUTANEOUS ART THROMBECTOMY/INFUSION INTRACRANIAL INC DIAG ANGIO  06/08/2018  . LAPAROSCOPIC LOW ANTERIOR RESECTION N/A 05/20/2013   Procedure: LAPAROSCOPIC LOW ANTERIOR RESECTION WITH SPLENIC FLEXURE MOBILIZATION;  Surgeon: Leighton Ruff, MD;  Location: [facility];  Service: General;  Laterality: N/A;  . OSTOMY N/A 05/20/2013   Procedure: diverting OSTOMY;  Surgeon: Leighton Ruff, MD;  Location: [facility];  Service: General;  Laterality: N/A;  . PERIPHERAL VASCULAR INTERVENTION Left 06/14/2018   Procedure: PERIPHERAL VASCULAR INTERVENTION;  Surgeon: Waynetta Sandy, MD;  Location: [facility];  Service: Cardiovascular;  Laterality: Left;  AVF on the Left  . RADIOLOGY WITH ANESTHESIA N/A 06/08/2018   Procedure: IR WITH ANESTHESIA;  Surgeon: Radiologist, Medication, MD;  Location: [facility];  Service: Radiology;  Laterality: N/A;  . VIDEO ASSISTED THORACOSCOPY (VATS)/WEDGE RESECTION Left 11/20/2015   Procedure: VIDEO ASSISTED THORACOSCOPY (VATS)/LUNG RESECTION;  Surgeon: [name], MD;  Location: [facility];  Service: Thoracic;  Laterality: Left;  Marland Kitchen VIDEO BRONCHOSCOPY N/A 11/20/2015   Procedure: VIDEO BRONCHOSCOPY;  Surgeon: [name], MD;  Location: [facility];  Service: Thoracic;  Laterality: N/A;    There were no vitals filed for this visit.  Subjective Assessment - 11/09/19 1147    Subjective  Patient will no new complaints, no falls reported today.    Pertinent History  HTN, dialysis (T, TH, S), rectal cancer, CHF, CVA on 07/05/18 ([facility])    Limitations  Walking;Standing;House hold activities    Patient Stated Goals  "Go back fishing"    Currently in Pain?  No/denies                       [facility] Adult PT Treatment/Exercise - 11/09/19 0001      Transfers   Transfers  Sit to Stand;Stand to Sit    Sit to Stand  6: Modified independent (Device/Increase time)    Stand to Sit  6: Modified independent (Device/Increase time);With upper  extremity assist;With armrests    Comments  x 5 reps with focus on eccentric control      Ambulation/Gait   Ambulation/Gait  Yes    Ambulation/Gait Assistance  5: Supervision    Ambulation/Gait Assistance Details  Completed gait training on outdoor surfaces with L [place] with medial strut donned (including grass, gravel, and pavement). Completed all with ambulation supervision. Increased fatigue noted with increasing distance and requiring CGA over unlevel surfaces. Overall patient reporting AFO feels comfortable and no complaints with ambulation.     Ambulation Distance (Feet)  1200 Feet    Assistive device  None    Gait Pattern  Decreased dorsiflexion - left;Decreased weight shift to left;Decreased stance time - left;Decreased step length - right;Decreased step length - left;Poor foot clearance - left    Ambulation Surface  Level;Indoor;Unlevel;Outdoor;Paved;Grass;Gravel      Neuro Re-ed    Neuro Re-ed Details   Completed  forward stepping over orange hurdles, 25' x 6, with verbal cues for improved hip/knee flexion on the LLE and to avoid circumduction for proper gait pattern as well as keeping pace to avoid slowing prior to stepping over object. Completed lateral stepping over orange hurdles 15' x 6, increased CGA required, freq verbal cues for foot placement and ensure keeping pelvis pointed forward for proper musculature recruitment. On blue balance beam with feet apart and EO,  focused on keeping steady 3 x 1 min each, with intermittent UE support from // bars. Increased sway noted with completion. Alternating floor touches forward x 10 reps, backwards x 10 reps with intermittent UE support and min/guard for steadying on blue foam. Completed alternating toe taps to cones standing on blue foam, 2 x 20 reps, with intermittent UE support.                PT Short Term Goals - 10/17/19 1543      PT SHORT TERM GOAL #1   Title  The patient will be indep with HEP for LE strengthening,  balance and general mobility. (Target Date: 11/16/19)    Time  4    Period  Weeks    Status  New    Target Date  11/16/19      PT SHORT TERM GOAL #2   Title  Pt will improve FGA to >/=20/30 in order to indicate decreased fall risk.    Time  4    Period  Weeks    Status  New      PT SHORT TERM GOAL #3   Title  Pt will improve gait speed to >/=3.40 ft/sec w/ AFO in order to indicate more normal gait speed.    Time  4    Period  Weeks    Status  New      PT SHORT TERM GOAL #4   Title  Pt will perform 5TSS </=11.5 secs without UE support in order to indicate decreased fall risk.    Time  4    Period  Weeks    Status  New        PT Long Term Goals - 10/17/19 1545      PT LONG TERM GOAL #1   Title  The patient will be indep with progression of HEP for post d/c exercise. (Target Date: 12/16/19)    Time  8    Period  Weeks    Status  New    Target Date  12/16/19      PT LONG TERM GOAL #2   Title  Pt will improve FGA to >/=23/30 in order to decrease fall risk.    Time  8    Period  Weeks    Status  New      PT LONG TERM GOAL #3   Title  Pt will ambulate x 1000' over varying outdoor surfaces without device with AFO at mod I level in order to return to leisure activiites.    Time  8    Period  Weeks    Status  New      PT LONG TERM GOAL #4   Title  Pt will simulate casting fishing line without LOB in order to return to leisure activity.    Time  8    Status  New            Plan - 11/09/19 1357    Clinical Impression Statement  Today's skilled PT session focused on completing further  gait training and balance exercises. Completed all gait training with L ottobok with medial strut, with focus on outdoor actvities to further challenge balance. With all balance actvities patient continues to demosntrate increased sway requiring CGA. Patient will continue to benefit from skilled PT services to progress toward unmet goals.    Personal Factors and Comorbidities   Age;Comorbidity 3+;Time since onset of injury/illness/exacerbation    Examination-Activity Limitations  Lift;Stand;Stairs;Squat;Locomotion Level    Examination-Participation Restrictions  Community Activity;Yard Work;Other   fishing   Stability/Clinical Decision Making  Stable/Uncomplicated    Rehab Potential  Good    PT Frequency  2x / week    PT Duration  8 weeks    PT Treatment/Interventions  ADLs/Self Care Home Management;Aquatic Therapy;DME Instruction;Gait training;Stair training;Functional mobility training;Therapeutic activities;Therapeutic exercise;Balance training;Neuromuscular re-education;Patient/family education;Orthotic Fit/Training;Passive range of motion;Vestibular    PT Next Visit Plan  Continued balance, BLE strengthening targeting hip musculature, gait training/stairs with AFO    PT Home Exercise Plan  see patient instructions for initial HEP access code    Consulted and Agree with Plan of Care  Patient       Patient will benefit from skilled therapeutic intervention in order to improve the following deficits and impairments:  Abnormal gait, Decreased balance, Decreased activity tolerance, Decreased endurance, Decreased knowledge of use of DME, Decreased mobility, Decreased range of motion, Decreased strength, Impaired flexibility, Postural dysfunction  Visit Diagnosis: Left foot drop  Muscle weakness (generalized)  Unsteadiness on feet  Other abnormalities of gait and mobility     Problem List Patient Active Problem List   Diagnosis Date Noted  . Meralgia paraesthetica, right 08/12/2019  . 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 Gait disturbance, post-stroke 04/17/2019  . Long term (current) use of anticoagulants 12/31/2018  . Atrial fibrillation, unspecified type ([place]) 12/22/2018  . Longstanding persistent atrial fibrillation ([place]) 09/20/2018  . Functional diarrhea 07/14/2018  . Benign essential HTN   . Hyperglycemia   . ESRD on  dialysis ([place]) 06/16/2018  . Mild malnutrition ([place]) 06/16/2018  . Stroke due to embolism ([place]) 06/16/2018  . CVA (cerebral vascular accident) ([place]) 06/08/2018  . CHF (congestive heart failure) ([place]) 05/08/2018  . Hyperlipidemia LDL goal <70 09/23/2017  . OAB (overactive bladder) 01/09/2016  . SUI (stress urinary incontinence), male 01/09/2016  . Hypersomnia 10/30/2015  . Obesity (BMI 30.0-34.9) 10/30/2015  . Routine general medical examination at a health care facility 06/04/2015  . Medicare annual wellness visit, subsequent 06/04/2015  . Hereditary and idiopathic peripheral neuropathy 09/14/2014  . Rectal cancer ([place]) 02/01/2013  . Obstructive sleep apnea 07/29/2010  . GOUT 05/11/2007  . ERECTILE DYSFUNCTION, MILD 05/11/2007    Jones Bales, PT, DPT 11/09/2019, 2:15 PM  [facility] [address] [place], [place], [address] Phone: [phone]   Fax:  [phone]  Name: Jason MARK Sr. MRN: [id number] Date of Birth: August 12, 1945

## 2019-11-10 ENCOUNTER — Telehealth: Payer: Self-pay | Admitting: Gastroenterology

## 2019-11-10 NOTE — Telephone Encounter (Signed)
Patients wife is calling- she states that she spoke to someone about 20 mins ago giving them a list of patients medication. States that she has no clue who she spoke with. Patient has procedure 5/18 and pre visit 5/10. I called Vivien Rota and pre visit and no one had spoken to here. Did you speak with patients wife? She states that she gave the medication to the person wrong and needed to fix it.

## 2019-11-10 NOTE — Telephone Encounter (Signed)
Spoke with pts wife and let her know our office did not call, it was most likely preadmission nurse from [facility]. She will try to reach them via the main [facility] number.

## 2019-11-11 ENCOUNTER — Other Ambulatory Visit: Payer: Self-pay

## 2019-11-11 ENCOUNTER — Telehealth: Payer: Self-pay | Admitting: Gastroenterology

## 2019-11-11 ENCOUNTER — Encounter: Payer: Self-pay | Admitting: Rehabilitation

## 2019-11-11 ENCOUNTER — Ambulatory Visit: Payer: Medicare HMO | Admitting: Rehabilitation

## 2019-11-11 DIAGNOSIS — R2681 Unsteadiness on feet: Secondary | ICD-10-CM | POA: Diagnosis not present

## 2019-11-11 DIAGNOSIS — I69354 Hemiplegia and hemiparesis following cerebral infarction affecting left non-dominant side: Secondary | ICD-10-CM

## 2019-11-11 DIAGNOSIS — M21372 Foot drop, left foot: Secondary | ICD-10-CM

## 2019-11-11 DIAGNOSIS — R2689 Other abnormalities of gait and mobility: Secondary | ICD-10-CM

## 2019-11-11 DIAGNOSIS — M6281 Muscle weakness (generalized): Secondary | ICD-10-CM | POA: Diagnosis not present

## 2019-11-11 NOTE — Telephone Encounter (Signed)
Spoke with pts wife and let her know the information below. She will check with dialysis tomorrow.  Jason Stabler, MD     11/08/19 10:05 AM Note   Thank you for getting Dr. Posey Pronto of [facility] on the phone today so I could discuss this patient's dialysis schedule the week of his endoscopic procedure.  Dr. Posey Pronto will adjust [name] dialysis schedule the week of May 17 to allow for the upper endoscopy scheduled for May 18.

## 2019-11-11 NOTE — Patient Instructions (Signed)
Access Code: [facility] URL: [email or web address] Date: 11/11/2019 Prepared by: Cameron Sprang  Exercises Standing Hip Abduction with Counter Support - 1 x daily - 5 x weekly - 1 sets - 10 reps Seated Gastroc Stretch with Strap - 2 x daily - 7 x weekly - 1 sets - 2 reps - 30 seconds hold Sit to Stand without Arm Support - 1 x daily - 7 x weekly - 1 sets - 10 reps Walking March - 1 x daily - 7 x weekly - 1 sets - 4 reps Tandem Walking with Counter Support - 1 x daily - 7 x weekly - 1 sets - 4 reps

## 2019-11-11 NOTE — Therapy (Signed)
[facility] [address] [place], [place], [address] Phone: [phone]   Fax:  [phone]  Physical Therapy Treatment  Patient Details  Name: Jason WIENEKE Sr. MRN: [id number] Date of Birth: Oct 24, 1945 Referring Provider (PT): [name], MD   Encounter Date: 11/11/2019  PT End of Session - 11/11/19 1021    Visit Number  7    Number of Visits  17    Date for PT Re-Evaluation  12/16/19    Authorization Type  [employer] medicare    Progress Note Due on Visit  10    PT Start Time  1018    PT Stop Time  1100    PT Time Calculation (min)  42 min    Equipment Utilized During Treatment  Gait belt    Activity Tolerance  Patient tolerated treatment well    Behavior During Therapy  WFL for tasks assessed/performed       Past Medical History:  Diagnosis Date  . Anemia   . Arthritis   . Chronic renal insufficiency 07/31/2011  . COPD (chronic obstructive pulmonary disease) ([place])    pt denies  . CVA (cerebral vascular accident) ([place]) 06/2018  . Deficiency anemia 07/20/2012  . Diverticulosis   . Edema 09/21/2010   Qualifier: Diagnosis of  By: Jerold Coombe    . ERECTILE DYSFUNCTION, MILD 05/11/2007   Qualifier: Diagnosis of  By: Sherren Mocha MD, Jory Ee   . Exertional dyspnea 10/30/2015  . GOUT 05/11/2007   Qualifier: Diagnosis of  By: Sherren Mocha MD, Jory Ee   . Hereditary and idiopathic peripheral neuropathy 09/14/2014  . History of radiation therapy 02/21/13-03/31/13   rectum 50.4Gy total dose  . Hypersomnia 10/30/2015  . Hypertension   . Lung nodule 10/30/2015  . OAB (overactive bladder) 01/09/2016  . Obesity (BMI 30.0-34.9) 10/30/2015  . Obstructive sleep apnea 07/29/2010   HST 11/2015 AHI 54.  On autocpap  >> 10 cm   . Peripheral edema 09/19/2013  . rectal ca dx'd 02/01/13   rectal. Radiation and chemotherapy- remains on chemotherapy-next tx. 08-22-13.   . S/P partial lobectomy of lung 11/20/2015  . S/P thoracotomy   . SUI (stress  urinary incontinence), male 01/09/2016    Past Surgical History:  Procedure Laterality Date  . A/V FISTULAGRAM Left 06/14/2018   Procedure: A/V FISTULAGRAM;  Surgeon: Waynetta Sandy, MD;  Location: [facility];  Service: Cardiovascular;  Laterality: Left;  . AV FISTULA PLACEMENT Left 03/24/2016   Procedure: CREATION OF LEFT BRACIOCEPHALIC ARTERIOVENOUS (AV) FISTULA;  Surgeon: Elam Dutch, MD;  Location: [facility];  Service: Vascular;  Laterality: Left;  . BIOPSY  09/17/2018   Procedure: BIOPSY;  Surgeon: Doran Stabler, MD;  Location: [facility];  Service: Gastroenterology;;  . BIOPSY  03/09/2019   Procedure: BIOPSY;  Surgeon: Doran Stabler, MD;  Location: [facility];  Service: Gastroenterology;;  . COLON SURGERY  05/20/2013  . COLONOSCOPY  2010   [facility]  . COLONOSCOPY WITH PROPOFOL N/A 09/17/2018   Procedure: COLONOSCOPY WITH PROPOFOL;  Surgeon: Doran Stabler, MD;  Location: [facility];  Service: Gastroenterology;  Laterality: N/A;  . ESOPHAGOGASTRODUODENOSCOPY (EGD) WITH PROPOFOL N/A 09/17/2018   Procedure: ESOPHAGOGASTRODUODENOSCOPY (EGD) WITH PROPOFOL;  Surgeon: Doran Stabler, MD;  [facility]: [facility];  Service: Gastroenterology;  Laterality: N/A;  . ESOPHAGOGASTRODUODENOSCOPY (EGD) WITH PROPOFOL N/A 03/09/2019   Procedure: ESOPHAGOGASTRODUODENOSCOPY (EGD) WITH PROPOFOL;  Surgeon: Doran Stabler, MD;  Location: [facility];  Service: Gastroenterology;  Laterality: N/A;  . EUS N/A 02/03/2013   Procedure: LOWER ENDOSCOPIC ULTRASOUND (EUS);  Surgeon: Milus Banister, MD;  Location: [facility];  Service: Endoscopy;  Laterality: N/A;  . ILEOSTOMY CLOSURE N/A 08/24/2013   Procedure: CLOSURE OF LOOP ILEOSTOMY ;  Surgeon: Leighton Ruff, MD;  Location: [facility];  Service: General;  Laterality: N/A;  . IR ANGIO VERTEBRAL SEL SUBCLAVIAN INNOMINATE BILAT MOD SED  06/08/2018  . IR CT HEAD LTD  06/08/2018  . IR INTRAVSC STENT CERV CAROTID W/O EMB-PROT MOD  SED INC ANGIO  06/08/2018  . IR PERCUTANEOUS ART THROMBECTOMY/INFUSION INTRACRANIAL INC DIAG ANGIO  06/08/2018  . LAPAROSCOPIC LOW ANTERIOR RESECTION N/A 05/20/2013   Procedure: LAPAROSCOPIC LOW ANTERIOR RESECTION WITH SPLENIC FLEXURE MOBILIZATION;  Surgeon: Leighton Ruff, MD;  Location: [facility];  Service: General;  Laterality: N/A;  . OSTOMY N/A 05/20/2013   Procedure: diverting OSTOMY;  Surgeon: Leighton Ruff, MD;  Location: [facility];  Service: General;  Laterality: N/A;  . PERIPHERAL VASCULAR INTERVENTION Left 06/14/2018   Procedure: PERIPHERAL VASCULAR INTERVENTION;  Surgeon: Waynetta Sandy, MD;  Location: [facility];  Service: Cardiovascular;  Laterality: Left;  AVF on the Left  . RADIOLOGY WITH ANESTHESIA N/A 06/08/2018   Procedure: IR WITH ANESTHESIA;  Surgeon: Radiologist, Medication, MD;  Location: [facility];  Service: Radiology;  Laterality: N/A;  . VIDEO ASSISTED THORACOSCOPY (VATS)/WEDGE RESECTION Left 11/20/2015   Procedure: VIDEO ASSISTED THORACOSCOPY (VATS)/LUNG RESECTION;  Surgeon: [name], MD;  Location: [facility];  Service: Thoracic;  Laterality: Left;  Marland Kitchen VIDEO BRONCHOSCOPY N/A 11/20/2015   Procedure: VIDEO BRONCHOSCOPY;  Surgeon: [name], MD;  Location: [facility];  Service: Thoracic;  Laterality: N/A;    There were no vitals filed for this visit.  Subjective Assessment - 11/11/19 1021    Subjective  Pt reports he is going to get brace on 5/17.  No other issues.    Pertinent History  HTN, dialysis (T, TH, S), rectal cancer, CHF, CVA on 07/05/18 ([facility])    Limitations  Walking;Standing;House hold activities    Currently in Pain?  No/denies         [facility] PT Assessment - 11/11/19 1025      Ambulation/Gait   Gait velocity  3.45 ft/sec with L AFO    Stairs  Yes    Stairs Assistance  5: Supervision    Stairs Assistance Details (indicate cue type and reason)  min cues for technique with AFO    Stair Management Technique  Alternating pattern;Forwards;One  rail Right;No rails    Number of Stairs  8    Height of Stairs  6      Functional Gait  Assessment   Gait assessed   Yes    Gait Level Surface  Walks 20 ft in less than 7 sec but greater than 5.5 sec, uses assistive device, slower speed, mild gait deviations, or deviates 6-10 in outside of the 12 in walkway width.   6.66 secs with L AFO   Change in Gait Speed  Able to change speed, demonstrates mild gait deviations, deviates 6-10 in outside of the 12 in walkway width, or no gait deviations, unable to achieve a major change in velocity, or uses a change in velocity, or uses an assistive device.    Gait with Horizontal Head Turns  Performs head turns smoothly with no change in gait. Deviates no more than 6 in outside 12 in walkway width  Gait with Vertical Head Turns  Performs head turns with no change in gait. Deviates no more than 6 in outside 12 in walkway width.    Gait and Pivot Turn  Pivot turns safely within 3 sec and stops quickly with no loss of balance.    Step Over Obstacle  Is able to step over 2 stacked shoe boxes taped together (9 in total height) without changing gait speed. No evidence of imbalance.    Gait with Narrow Base of Support  Ambulates 4-7 steps.    Gait with Eyes Closed  Walks 20 ft, slow speed, abnormal gait pattern, evidence for imbalance, deviates 10-15 in outside 12 in walkway width. Requires more than 9 sec to ambulate 20 ft.    Ambulating Backwards  Walks 20 ft, uses assistive device, slower speed, mild gait deviations, deviates 6-10 in outside 12 in walkway width.    Steps  Alternating feet, must use rail.    Total Score  22    FGA comment:  19-24 = medium risk fall                   [facility] Adult PT Treatment/Exercise - 11/11/19 1025      Ambulation/Gait   Ambulation/Gait  Yes    Ambulation/Gait Assistance  5: Supervision    Ambulation/Gait Assistance Details  Min cues for posture and increased stride length.     Ambulation Distance (Feet)  300  Feet    Assistive device  None    Gait Pattern  Decreased dorsiflexion - left;Decreased weight shift to left;Decreased stance time - left;Decreased step length - right;Decreased step length - left;Poor foot clearance - left    Ambulation Surface  Level;Indoor      Exercises   Exercises  Other Exercises    Other Exercises   Reviewed and updated HEP, see pt instruction for details.           Access Code: [facility] URL: [email or web address] Date: 11/11/2019 Prepared by: Cameron Sprang  Exercises Standing Hip Abduction with Counter Support - 1 x daily - 5 x weekly - 1 sets - 10 reps Seated Gastroc Stretch with Strap - 2 x daily - 7 x weekly - 1 sets - 2 reps - 30 seconds hold Sit to Stand without Arm Support - 1 x daily - 7 x weekly - 1 sets - 10 reps Walking March - 1 x daily - 7 x weekly - 1 sets - 4 reps Tandem Walking with Counter Support - 1 x daily - 7 x weekly - 1 sets - 4 reps    PT Education - 11/11/19 1242    Education Details  updated HEP    Person(s) Educated  Patient    Methods  Explanation;Demonstration    Comprehension  Verbalized understanding;Returned demonstration       PT Short Term Goals - 11/11/19 1023      PT SHORT TERM GOAL #1   Title  The patient will be indep with HEP for LE strengthening, balance and general mobility. (Target Date: 11/16/19)    Time  4    Period  Weeks    Status  Achieved    Target Date  11/16/19      PT SHORT TERM GOAL #2   Title  Pt will improve FGA to >/=20/30 in order to indicate decreased fall risk.    Baseline  22/30 on 11/11/19    Time  4    Period  Weeks    Status  Achieved      PT SHORT TERM GOAL #3   Title  Pt will improve gait speed to >/=3.40 ft/sec w/ AFO in order to indicate more normal gait speed.    Baseline  3.45 ft/sec with AFO    Time  4    Period  Weeks    Status  Achieved      PT SHORT TERM GOAL #4   Title  Pt will perform 5TSS </=11.5 secs without UE support in order to indicate decreased  fall risk.    Baseline  12.45 on 11/11/19 without UE support    Time  4    Period  Weeks    Status  Partially Met        PT Long Term Goals - 10/17/19 1545      PT LONG TERM GOAL #1   Title  The patient will be indep with progression of HEP for post d/c exercise. (Target Date: 12/16/19)    Time  8    Period  Weeks    Status  New    Target Date  12/16/19      PT LONG TERM GOAL #2   Title  Pt will improve FGA to >/=23/30 in order to decrease fall risk.    Time  8    Period  Weeks    Status  New      PT LONG TERM GOAL #3   Title  Pt will ambulate x 1000' over varying outdoor surfaces without device with AFO at mod I level in order to return to leisure activiites.    Time  8    Period  Weeks    Status  New      PT LONG TERM GOAL #4   Title  Pt will simulate casting fishing line without LOB in order to return to leisure activity.    Time  8    Status  New            Plan - 11/11/19 1244    Clinical Impression Statement  Session focused on assessment of STGs. Pt has met 3/4 STGs, partially meeting goal for 5TSS but not to goal level.  Updated HEP to reflect progress.  Pt doing very well with L AFO and is to get his May 17.    Personal Factors and Comorbidities  Age;Comorbidity 3+;Time since onset of injury/illness/exacerbation    Examination-Activity Limitations  Lift;Stand;Stairs;Squat;Locomotion Level    Examination-Participation Restrictions  Community Activity;Yard Work;Other   fishing   Stability/Clinical Decision Making  Stable/Uncomplicated    Rehab Potential  Good    PT Frequency  2x / week    PT Duration  8 weeks    PT Treatment/Interventions  ADLs/Self Care Home Management;Aquatic Therapy;DME Instruction;Gait training;Stair training;Functional mobility training;Therapeutic activities;Therapeutic exercise;Balance training;Neuromuscular re-education;Patient/family education;Orthotic Fit/Training;Passive range of motion;Vestibular    PT Next Visit Plan  Continued  balance, BLE strengthening targeting hip musculature, gait training/stairs with AFO    PT Home Exercise Plan  see patient instructions for initial HEP access code    Consulted and Agree with Plan of Care  Patient       Patient will benefit from skilled therapeutic intervention in order to improve the following deficits and impairments:  Abnormal gait, Decreased balance, Decreased activity tolerance, Decreased endurance, Decreased knowledge of use of DME, Decreased mobility, Decreased range of motion, Decreased strength, Impaired flexibility, Postural dysfunction  Visit Diagnosis: Left foot drop  Muscle weakness (generalized)  Unsteadiness on feet  Other abnormalities  of gait and mobility  Hemiplegia and hemiparesis following cerebral infarction affecting left non-dominant side [facility])     Problem List Patient Active Problem List   Diagnosis Date Noted  . Meralgia paraesthetica, right 08/12/2019  . 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 Gait disturbance, post-stroke 04/17/2019  . Long term (current) use of anticoagulants 12/31/2018  . Atrial fibrillation, unspecified type ([place]) 12/22/2018  . Longstanding persistent atrial fibrillation ([place]) 09/20/2018  . Functional diarrhea 07/14/2018  . Benign essential HTN   . Hyperglycemia   . ESRD on dialysis ([place]) 06/16/2018  . Mild malnutrition ([place]) 06/16/2018  . Stroke due to embolism ([place]) 06/16/2018  . CVA (cerebral vascular accident) ([place]) 06/08/2018  . CHF (congestive heart failure) ([place]) 05/08/2018  . Hyperlipidemia LDL goal <70 09/23/2017  . OAB (overactive bladder) 01/09/2016  . SUI (stress urinary incontinence), male 01/09/2016  . Hypersomnia 10/30/2015  . Obesity (BMI 30.0-34.9) 10/30/2015  . Routine general medical examination at a health care facility 06/04/2015  . Medicare annual wellness visit, subsequent 06/04/2015  . Hereditary and idiopathic peripheral neuropathy 09/14/2014  . Rectal cancer  ([place]) 02/01/2013  . Obstructive sleep apnea 07/29/2010  . GOUT 05/11/2007  . ERECTILE DYSFUNCTION, MILD 05/11/2007    Cameron Sprang, PT, MPT [facility] [address] [place], [place], [address] Phone: [phone]   Fax:  [phone] 11/11/19, 12:50 PM  Name: Jason PERETZ Sr. MRN: [id number] Date of Birth: 1945/09/16

## 2019-11-11 NOTE — Progress Notes (Signed)
Dialysis patient per wife.  Dialysis on Tues, Thurs, and Sat dialysis time is the same time as colonoscopy.  Instructed wife to call Dr Loletha Carrow office to inform them of above so appts can be rearrange.  Wife voiced understanding.

## 2019-11-14 ENCOUNTER — Ambulatory Visit (AMBULATORY_SURGERY_CENTER): Payer: Self-pay | Admitting: *Deleted

## 2019-11-14 ENCOUNTER — Ambulatory Visit: Payer: Self-pay | Admitting: Cardiology

## 2019-11-14 ENCOUNTER — Other Ambulatory Visit: Payer: Self-pay

## 2019-11-14 VITALS — Temp 97.1°F | Ht 66.5 in | Wt 151.0 lb

## 2019-11-14 DIAGNOSIS — D122 Benign neoplasm of ascending colon: Secondary | ICD-10-CM

## 2019-11-14 DIAGNOSIS — Z01818 Encounter for other preprocedural examination: Secondary | ICD-10-CM

## 2019-11-14 NOTE — Progress Notes (Signed)
No egg or soy allergy known to patient  No issues with past sedation with any surgeries  or procedures, no intubation problems  No diet pills per patient No home 02 use per patient  No blood thinners per patient  Pt denies issues with constipation  No A fib or A flutter  EMMI video sent to pt's e mail   08-27-2019 completed covid vaccines   Pt has cardio OV 5-12 to get Lovenox bridge instructions when he comes off Coumadin for his 5-18 colon at [facility]-   Due to the COVID-19 pandemic we are asking patients to follow these guidelines. Please only bring one care partner. Please be aware that your care partner may wait in the car in the parking lot or if they feel like they will be too hot to wait in the car, they may wait in the lobby on the 4th floor. All care partners are required to wear a mask the entire time (we do not have any that we can provide them), they need to practice social distancing, and we will do a Covid check for all patient's and care partners when you arrive. Also we will check their temperature and your temperature. If the care partner waits in their car they need to stay in the parking lot the entire time and we will call them on their cell phone when the patient is ready for discharge so they can bring the car to the front of the building. Also all patient's will need to wear a mask into building.

## 2019-11-15 ENCOUNTER — Ambulatory Visit: Payer: Medicare HMO

## 2019-11-16 ENCOUNTER — Encounter: Payer: Self-pay | Admitting: Cardiology

## 2019-11-16 ENCOUNTER — Ambulatory Visit: Payer: Medicare HMO | Admitting: Cardiology

## 2019-11-16 ENCOUNTER — Encounter: Payer: Self-pay | Admitting: Podiatry

## 2019-11-16 ENCOUNTER — Other Ambulatory Visit: Payer: Self-pay

## 2019-11-16 ENCOUNTER — Ambulatory Visit (INDEPENDENT_AMBULATORY_CARE_PROVIDER_SITE_OTHER): Payer: Medicare HMO | Admitting: Podiatry

## 2019-11-16 VITALS — BP 157/80 | HR 60 | Ht 68.0 in | Wt 153.0 lb

## 2019-11-16 VITALS — Temp 96.4°F

## 2019-11-16 DIAGNOSIS — Z992 Dependence on renal dialysis: Secondary | ICD-10-CM

## 2019-11-16 DIAGNOSIS — I10 Essential (primary) hypertension: Secondary | ICD-10-CM | POA: Diagnosis not present

## 2019-11-16 DIAGNOSIS — Z87891 Personal history of nicotine dependence: Secondary | ICD-10-CM | POA: Diagnosis not present

## 2019-11-16 DIAGNOSIS — M79675 Pain in left toe(s): Secondary | ICD-10-CM

## 2019-11-16 DIAGNOSIS — N186 End stage renal disease: Secondary | ICD-10-CM

## 2019-11-16 DIAGNOSIS — I480 Paroxysmal atrial fibrillation: Secondary | ICD-10-CM | POA: Diagnosis not present

## 2019-11-16 DIAGNOSIS — I5022 Chronic systolic (congestive) heart failure: Secondary | ICD-10-CM | POA: Diagnosis not present

## 2019-11-16 DIAGNOSIS — Z7901 Long term (current) use of anticoagulants: Secondary | ICD-10-CM | POA: Diagnosis not present

## 2019-11-16 DIAGNOSIS — M79674 Pain in right toe(s): Secondary | ICD-10-CM

## 2019-11-16 DIAGNOSIS — Z8673 Personal history of transient ischemic attack (TIA), and cerebral infarction without residual deficits: Secondary | ICD-10-CM

## 2019-11-16 DIAGNOSIS — B351 Tinea unguium: Secondary | ICD-10-CM | POA: Diagnosis not present

## 2019-11-16 DIAGNOSIS — Z85048 Personal history of other malignant neoplasm of rectum, rectosigmoid junction, and anus: Secondary | ICD-10-CM

## 2019-11-16 DIAGNOSIS — Z01810 Encounter for preprocedural cardiovascular examination: Secondary | ICD-10-CM | POA: Diagnosis not present

## 2019-11-16 DIAGNOSIS — G629 Polyneuropathy, unspecified: Secondary | ICD-10-CM | POA: Diagnosis not present

## 2019-11-16 MED ORDER — BIDIL 20-37.5 MG PO TABS: 1.0000 | ORAL_TABLET | Freq: Three times a day (TID) | ORAL | 0 refills | Status: DC

## 2019-11-16 NOTE — Progress Notes (Signed)
Jason K Venturella Sr. Date of Birth: 1946-03-22 MRN: [id number] Primary Care Provider:Jones, Arvid Right, MD Primary Cardiologist: Dr. Vernell Leep  Date: 11/16/19  Chief Complaint  Patient presents with  . Congestive Heart Failure  . Medical Clearance    HPI  Jason DICKENSON Sr. is a 74 y.o.  male who presents to the office with a chief complaint of " follow-up congestive heart failure preprocedural clearance." Patient's past medical history and cardiovascular risk factors include: Hypertension, end-stage renal disease, history of stroke, atrial fibrillation, history of colorectal cancer status post surgery and radiation, heart failure with reduced EF, NYHA class II, cardiomyopathy, former smoker.  Last seen by Dr. Vernell Leep back in September 2019 for management of cardiomyopathy.  At that time his underlying cardiomyopathy was noted to be mostly multifactorial and referred to EP for consideration of AICD.  He was seen and evaluated by cardiac electrophysiologist Dr. Virl Axe and was deemed not an appropriate AICD candidate.  Please refer to his consultation note as of September 2019.  Since then patient had a stroke in December 2019 and then later was diagnosed with atrial fibrillation.  He was started on Eliquis but that was cost prohibitive and was transitioned to Coumadin.  Patient follows up with [facility] in regards to INR management.  Since last office visit he was also been started on hemodialysis due to end-stage renal disease.  Functionally patient states that he is active on a daily basis.  He bikes at least an hour every day without any symptoms of chest pain or shortness of breath.  Clinically patient is euvolemic and not in congestive heart failure.  Patient is scheduled for his screening colonoscopy given his history of colorectal cancer in the past.  Patient presents to the office today for preprocedural risk stratification.  ALLERGIES: Allergies  Allergen  Reactions  . Nsaids Other (See Comments)    Asked by surgeon to add this medication class as intolerance due to patients renal insufficiency.  . Lactase Other (See Comments)  . Lactose Intolerance (Gi) Diarrhea  . Phentermine Other (See Comments)  . Amlodipine Other (See Comments)    Edema      MEDICATION LIST PRIOR TO VISIT: Current Outpatient Medications on File Prior to Visit  Medication Sig Dispense Refill  . acetaminophen (TYLENOL) 325 MG tablet Take 325 mg by mouth every 4 (four) hours as needed for mild pain or moderate pain.     Marland Kitchen allopurinol (ZYLOPRIM) 100 MG tablet Take 1 tablet by mouth once daily (Patient taking differently: Take 100 mg by mouth daily. ) 90 tablet 0  . atorvastatin (LIPITOR) 20 MG tablet TAKE 1 TABLET BY MOUTH  DAILY AT 6 PM. (Patient taking differently: Take 20 mg by mouth at bedtime. ) 90 tablet 1  . cinacalcet (SENSIPAR) 60 MG tablet Take 60 mg by mouth daily.    . lipase/protease/amylase (CREON) 36000 UNITS CPEP capsule Take 1 capsule (36,000 Units total) by mouth 3 (three) times daily with meals. (Patient taking differently: Take 36,000 Units by mouth 3 (three) times daily before meals. ) 56 capsule 0  . loperamide (IMODIUM) 2 MG capsule Take 2 mg by mouth as needed for diarrhea or loose stools.    . metoprolol succinate (TOPROL-XL) 25 MG 24 hr tablet Take 1 tablet (25 mg total) by mouth daily. Please schedule appointment for refills 90 tablet 1  . polycarbophil (FIBERCON) 625 MG tablet Take 625 mg by mouth daily as needed for diarrhea or loose stools.    Marland Kitchen  warfarin (COUMADIN) 5 MG tablet Take 1 tablet daily except 1/2 tablet on Mon and Fridays or TAKE AS DIRECTED BY ANTICOAGULATION CLINIC (Patient taking differently: Take 2.5-5 mg by mouth See admin instructions. Take 1 tablet daily except 1/2 tablet on Mon and Fridays or TAKE AS DIRECTED BY ANTICOAGULATION CLINIC) 90 tablet 1  . ferric citrate (AURYXIA) 1 GM 210 MG(Fe) tablet Take 420 mg by mouth See admin  instructions. Take 420 mg with breakfast, 420 mg with dinner, and 420 mg with a snack at bedtime    . gabapentin (NEURONTIN) 100 MG capsule Take 1 capsule (100 mg total) by mouth 3 (three) times daily. 90 capsule 5  . [DISCONTINUED] atorvastatin (LIPITOR) 20 MG tablet TAKE 1 TABLET BY MOUTH  DAILY AT 6 PM. 90 tablet 1   No current facility-administered medications on file prior to visit.    PAST MEDICAL HISTORY: Past Medical History:  Diagnosis Date  . Anemia   . Arthritis   . Atrial fibrillation ([place])    on Coumadin   . Chronic renal insufficiency 07/31/2011  . COPD (chronic obstructive pulmonary disease) ([place])    pt denies  . CVA (cerebral vascular accident) ([place]) 06/2018  . Deficiency anemia 07/20/2012  . Diverticulosis   . Edema 09/21/2010   Qualifier: Diagnosis of  By: Jerold Coombe    . ERECTILE DYSFUNCTION, MILD 05/11/2007   Qualifier: Diagnosis of  By: Sherren Mocha MD, Jory Ee   . Exertional dyspnea 10/30/2015  . GOUT 05/11/2007   Qualifier: Diagnosis of  By: Sherren Mocha MD, Jory Ee   . Hereditary and idiopathic peripheral neuropathy 09/14/2014  . History of radiation therapy 02/21/13-03/31/13   rectum 50.4Gy total dose  . Hypersomnia 10/30/2015  . Hypertension   . Lung nodule 10/30/2015  . OAB (overactive bladder) 01/09/2016  . Obesity (BMI 30.0-34.9) 10/30/2015  . Obstructive sleep apnea 07/29/2010   HST 11/2015 AHI 54.  On autocpap  >> 10 cm   . Peripheral edema 09/19/2013  . rectal ca dx'd 02/01/13   rectal. Radiation and chemotherapy- remains on chemotherapy-next tx. 08-22-13.   . S/P partial lobectomy of lung 11/20/2015  . S/P thoracotomy   . Sleep apnea    wears cpap   . SUI (stress urinary incontinence), male 01/09/2016    PAST SURGICAL HISTORY: Past Surgical History:  Procedure Laterality Date  . A/V FISTULAGRAM Left 06/14/2018   Procedure: A/V FISTULAGRAM;  Surgeon: Waynetta Sandy, MD;  Location: [facility];  Service: Cardiovascular;  Laterality: Left;  . AV  FISTULA PLACEMENT Left 03/24/2016   Procedure: CREATION OF LEFT BRACIOCEPHALIC ARTERIOVENOUS (AV) FISTULA;  Surgeon: Elam Dutch, MD;  Location: [facility];  Service: Vascular;  Laterality: Left;  . BIOPSY  09/17/2018   Procedure: BIOPSY;  Surgeon: Doran Stabler, MD;  Location: [facility];  Service: Gastroenterology;;  . BIOPSY  03/09/2019   Procedure: BIOPSY;  Surgeon: Doran Stabler, MD;  Location: [facility];  Service: Gastroenterology;;  . COLON SURGERY  05/20/2013  . COLONOSCOPY  2010   [facility]  . COLONOSCOPY WITH PROPOFOL N/A 09/17/2018   Procedure: COLONOSCOPY WITH PROPOFOL;  Surgeon: Doran Stabler, MD;  Location: [facility];  Service: Gastroenterology;  Laterality: N/A;  . ESOPHAGOGASTRODUODENOSCOPY (EGD) WITH PROPOFOL N/A 09/17/2018   Procedure: ESOPHAGOGASTRODUODENOSCOPY (EGD) WITH PROPOFOL;  Surgeon: Doran Stabler, MD;  [facility]: [facility];  Service: Gastroenterology;  Laterality: N/A;  . ESOPHAGOGASTRODUODENOSCOPY (EGD) WITH PROPOFOL N/A 03/09/2019   Procedure: ESOPHAGOGASTRODUODENOSCOPY (  EGD) WITH PROPOFOL;  Surgeon: Doran Stabler, MD;  Location: [facility];  Service: Gastroenterology;  Laterality: N/A;  . EUS N/A 02/03/2013   Procedure: LOWER ENDOSCOPIC ULTRASOUND (EUS);  Surgeon: Milus Banister, MD;  Location: [facility];  Service: Endoscopy;  Laterality: N/A;  . ILEOSTOMY CLOSURE N/A 08/24/2013   Procedure: CLOSURE OF LOOP ILEOSTOMY ;  Surgeon: Leighton Ruff, MD;  Location: [facility];  Service: General;  Laterality: N/A;  . IR ANGIO VERTEBRAL SEL SUBCLAVIAN INNOMINATE BILAT MOD SED  06/08/2018  . IR CT HEAD LTD  06/08/2018  . IR INTRAVSC STENT CERV CAROTID W/O EMB-PROT MOD SED INC ANGIO  06/08/2018  . IR PERCUTANEOUS ART THROMBECTOMY/INFUSION INTRACRANIAL INC DIAG ANGIO  06/08/2018  . LAPAROSCOPIC LOW ANTERIOR RESECTION N/A 05/20/2013   Procedure: LAPAROSCOPIC LOW ANTERIOR RESECTION WITH SPLENIC FLEXURE MOBILIZATION;  Surgeon: Leighton Ruff, MD;   Location: [facility];  Service: General;  Laterality: N/A;  . OSTOMY N/A 05/20/2013   Procedure: diverting OSTOMY;  Surgeon: Leighton Ruff, MD;  Location: [facility];  Service: General;  Laterality: N/A;  . PERIPHERAL VASCULAR INTERVENTION Left 06/14/2018   Procedure: PERIPHERAL VASCULAR INTERVENTION;  Surgeon: Waynetta Sandy, MD;  Location: [facility];  Service: Cardiovascular;  Laterality: Left;  AVF on the Left  . POLYPECTOMY    . RADIOLOGY WITH ANESTHESIA N/A 06/08/2018   Procedure: IR WITH ANESTHESIA;  Surgeon: Radiologist, Medication, MD;  Location: [facility];  Service: Radiology;  Laterality: N/A;  . UPPER GASTROINTESTINAL ENDOSCOPY    . VIDEO ASSISTED THORACOSCOPY (VATS)/WEDGE RESECTION Left 11/20/2015   Procedure: VIDEO ASSISTED THORACOSCOPY (VATS)/LUNG RESECTION;  Surgeon: [name], MD;  Location: [facility];  Service: Thoracic;  Laterality: Left;  Marland Kitchen VIDEO BRONCHOSCOPY N/A 11/20/2015   Procedure: VIDEO BRONCHOSCOPY;  Surgeon: [name], MD;  Location: [facility];  Service: Thoracic;  Laterality: N/A;    FAMILY HISTORY: The patient's family history includes Hypertension in his father, mother, and another family member; Stroke in his father and mother.   SOCIAL HISTORY:  The patient  reports that he quit smoking about 42 years ago. His smoking use included cigarettes. He has a 5.00 pack-year smoking history. He has never used smokeless tobacco. He reports previous alcohol use. He reports that he does not use drugs.  Review of Systems  Constitution: Negative for chills and fever.  HENT: Negative for hoarse voice and nosebleeds.   Eyes: Negative for discharge, double vision and pain.  Cardiovascular: Negative for chest pain, claudication, dyspnea on exertion, leg swelling, near-syncope, orthopnea, palpitations, paroxysmal nocturnal dyspnea and syncope.  Respiratory: Negative for hemoptysis and shortness of breath.   Musculoskeletal: Negative for muscle cramps and myalgias.    Gastrointestinal: Negative for abdominal pain, constipation, diarrhea, hematemesis, hematochezia, melena, nausea and vomiting.  Neurological: Negative for dizziness and light-headedness.    PHYSICAL EXAM: Vitals with BMI 11/16/2019 11/14/2019 09/26/2019  Height 5\' 8"  5' 6.5" 5' 6.5"  Weight 153 lbs 151 lbs 156 lbs 6 oz  BMI 23.27 38.10 17.51  Systolic 025 - 852  Diastolic 80 - 60  Pulse 60 - 80    CONSTITUTIONAL: Well-developed and well-nourished. No acute distress.  SKIN: Skin is warm and dry. No rash noted. No cyanosis. No pallor. No jaundice HEAD: Normocephalic and atraumatic.  EYES: No scleral icterus MOUTH/THROAT: Moist oral membranes.  NECK: No JVD present. No thyromegaly noted. LYMPHATIC: No visible cervical adenopathy.  CHEST Normal respiratory effort. No intercostal retractions  LUNGS: Clear to auscultation bilaterally.  No stridor. No wheezes. No rales.  CARDIOVASCULAR: Regular, positive R0-Q7, soft systolic flow murmur heard over the anterior precordial chest, no gallops or rubs. ABDOMINAL: Nonobese, soft, nontender, nondistended, positive bowel sounds in all 4 quadrants, no apparent ascites.  EXTREMITIES: No peripheral edema, decreased dorsalis pedis and posterior tibial pulses bilaterally. HEMATOLOGIC: No significant bruising NEUROLOGIC: Oriented to person, place, and time. Nonfocal. Normal muscle tone.  PSYCHIATRIC: Normal mood and affect. Normal behavior. Cooperative  RADIOLOGY: 06/2018: Endovascular complete revascularization of occluded right internal carotid artery acutely with stent assisted angioplasty, and the right middle cerebral artery and the right internal carotid artery terminus, and the proximal right anterior cerebral artery with 2 passes with the 5 mm x 33 mm Embotap retrieval device, and 1 pass with the 4 mm x 40 mm X Solitaire retrieval device using a TICI 2b revascularization.  Rescue stenting of recurrent occlusion of the right middle  cerebral artery in the right MCA trifurcation region with an Atlas Neuroform 4 mm x 24 mm stent.  Mechanical thrombolysis of acute occlusion of the stented segment of the right middle cerebral artery with 6 mg of super selective intracranial intra-arterial Integrilin, and mechanical thrombolysis using the microcatheter and micro guidewire achieving a TICI 2b revascularization.  Placement of an Enterprise 4 mm x 30 mm stent at the site of a focal dissection in the mid right ICA as described.  CARDIAC DATABASE: EKG: 11/16/2019: Normal sinus rhythm, 74 bpm, normal axis, nonspecific T wave abnormality, no underlying injury pattern.  Echocardiogram: 06/09/2018: LVEF 20-25%, severely reduced left ventricular systolic function, global hypokinesis, mildly dilated left atrium, mildly dilated right atrium.  Carotid duplex: 07/2017: No hemodynamically significant arterial disease in the internal carotid arteries bilaterally.  Antegrade flow in the vertebral arteries bilaterally.  LABORATORY DATA: CBC Latest Ref Rng & Units 04/08/2019 03/09/2019 02/26/2019  WBC 4.0 - 10.5 K/uL 6.4 - 6.1  Hemoglobin 13.0 - 17.0 g/dL 13.2 12.6(L) 10.3(L)  Hematocrit 39.0 - 52.0 % 42.2 37.0(L) 31.6(L)  Platelets 150 - 400 K/uL 179 - 217    CMP Latest Ref Rng & Units 04/08/2019 03/09/2019 02/26/2019  Glucose 70 - 99 mg/dL 83 79 89  BUN 8 - 23 mg/dL 27(H) - 23  Creatinine 0.61 - 1.24 mg/dL 6.84(H) - 7.13(H)  Sodium 135 - 145 mmol/L 138 134(L) 140  Potassium 3.5 - 5.1 mmol/L 3.7 4.4 3.5  Chloride 98 - 111 mmol/L 98 - 95(L)  CO2 22 - 32 mmol/L 25 - 30  Calcium 8.9 - 10.3 mg/dL 10.5(H) - 10.2  Total Protein 6.5 - 8.1 g/dL - - 7.3  Total Bilirubin 0.3 - 1.2 mg/dL - - 0.8  Alkaline Phos 38 - 126 U/L - - 104  AST 15 - 41 U/L - - 19  ALT 0 - 44 U/L - - 14    Lipid Panel     Component Value Date/Time   CHOL 151 08/17/2018 0816   TRIG 114 08/17/2018 0816   TRIG 104 05/11/2006 1014   HDL 66 08/17/2018 0816    CHOLHDL 2.3 08/17/2018 0816   CHOLHDL 3.6 06/09/2018 0454   VLDL 20 06/09/2018 0454   LDLCALC 62 08/17/2018 0816   LDLDIRECT 120.0 06/04/2015 1015   LABVLDL 23 08/17/2018 0816    Lab Results  Component Value Date   HGBA1C 5.5 06/09/2018   HGBA1C 6.0 12/20/2009   HGBA1C 5.8 05/11/2006   No components found for: NTPROBNP Lab Results  Component Value Date   TSH 1.83 10/19/2018  TSH 5.41 (H) 09/23/2017   TSH 1.90 08/22/2015    Cardiac Panel (last 3 results) No results for input(s): CKTOTAL, CKMB, TROPONINIHS, RELINDX in the last 72 hours.  IMPRESSION:    ICD-10-CM   1. Preprocedural cardiovascular examination  Z01.810   2. Chronic HFrEF (heart failure with reduced ejection fraction) (HCC)  I50.22 EKG 12-Lead    isosorbide-hydrALAZINE (BIDIL) 20-37.5 MG tablet  3. Benign essential HTN  I10   4. ESRD on dialysis ([place])  N18.6    Z99.2   5. Hx of stroke  Z86.73   6. Paroxysmal atrial fibrillation (HCC)  I48.0   7. Long term (current) use of anticoagulants  Z79.01   8. History of colorectal cancer  Z85.048   9. Former smoker  Z87.891      RECOMMENDATIONS: Jason CUBIT Sr. is a 74 y.o. male whose past medical history and cardiovascular risk factors include: Hypertension, end-stage renal disease, history of stroke, atrial fibrillation, history of colorectal cancer status post surgery and radiation, heart failure with reduced EF, NYHA class II, cardiomyopathy, former smoker.  Preprocedure cardiovascular examination:  Patient has a complex past medical history which includes end-stage renal disease on hemodialysis, history of stroke as mentioned above, and heart failure with reduced EF NYHA class II.  EKG today shows normal sinus rhythm without underlying ischemia or injury pattern.  Clinically patient is euvolemic and not in congestive heart failure.  He does not have any angina or anginal equivalent symptoms.  Physically patient uses a stationary bike for 1 hour without any chest  pain or anginal equivalents.    Given his multiple medical issues he is clearly high risk from a cardiovascular standpoint for the planned procedure.  However, this risk is not modifiable.  I feel no further cardiac evaluation is needed preprocedurally.  I have also explained this to the patient and he understands and tells me that he is willing to accept the risk.  We will be available if needed.  Please call if any questions arise.   Heart failure with reduced EF, stage C, NYHA class II:  Medications reconciled.  Patient is currently on beta-blocker therapy.  We will start BiDil for now.  We will continue to uptitrate guideline directed medical therapy at his follow-up office visits.  Cardiomyopathy:   In the past patient did not undergo a left heart catheterization given the fact that he was stage V kidney disease and not on hemodialysis.  Since then patient has been lost to follow-up and now has been on hemodialysis for the last 1 year.  Continue to uptitrate guideline directed medical therapy.  Most recent echocardiogram from 2019 results reviewed.  Paroxysmal atrial fibrillation:  Currently normal sinus rhythm.  On beta-blocker therapy for rate control.  On Coumadin for thromboembolic prophylaxis.  His INRs are currently being managed by [facility].  FINAL MEDICATION LIST END OF ENCOUNTER: Meds ordered this encounter  Medications  . isosorbide-hydrALAZINE (BIDIL) 20-37.5 MG tablet    Sig: Take 1 tablet by mouth 3 (three) times daily.    Dispense:  90 tablet    Refill:  0     Current Outpatient Medications:  .  acetaminophen (TYLENOL) 325 MG tablet, Take 325 mg by mouth every 4 (four) hours as needed for mild pain or moderate pain. , Disp: , Rfl:  .  allopurinol (ZYLOPRIM) 100 MG tablet, Take 1 tablet by mouth once daily (Patient taking differently: Take 100 mg by mouth daily. ), Disp: 90 tablet, Rfl: 0 .  atorvastatin (LIPITOR) 20 MG tablet, TAKE 1 TABLET BY MOUTH  DAILY  AT 6 PM. (Patient taking differently: Take 20 mg by mouth at bedtime. ), Disp: 90 tablet, Rfl: 1 .  cinacalcet (SENSIPAR) 60 MG tablet, Take 60 mg by mouth daily., Disp: , Rfl:  .  lipase/protease/amylase (CREON) 36000 UNITS CPEP capsule, Take 1 capsule (36,000 Units total) by mouth 3 (three) times daily with meals. (Patient taking differently: Take 36,000 Units by mouth 3 (three) times daily before meals. ), Disp: 56 capsule, Rfl: 0 .  loperamide (IMODIUM) 2 MG capsule, Take 2 mg by mouth as needed for diarrhea or loose stools., Disp: , Rfl:  .  metoprolol succinate (TOPROL-XL) 25 MG 24 hr tablet, Take 1 tablet (25 mg total) by mouth daily. Please schedule appointment for refills, Disp: 90 tablet, Rfl: 1 .  polycarbophil (FIBERCON) 625 MG tablet, Take 625 mg by mouth daily as needed for diarrhea or loose stools., Disp: , Rfl:  .  warfarin (COUMADIN) 5 MG tablet, Take 1 tablet daily except 1/2 tablet on Mon and Fridays or TAKE AS DIRECTED BY ANTICOAGULATION CLINIC (Patient taking differently: Take 2.5-5 mg by mouth See admin instructions. Take 1 tablet daily except 1/2 tablet on Mon and Fridays or TAKE AS DIRECTED BY ANTICOAGULATION CLINIC), Disp: 90 tablet, Rfl: 1 .  ferric citrate (AURYXIA) 1 GM 210 MG(Fe) tablet, Take 420 mg by mouth See admin instructions. Take 420 mg with breakfast, 420 mg with dinner, and 420 mg with a snack at bedtime, Disp: , Rfl:  .  gabapentin (NEURONTIN) 100 MG capsule, Take 1 capsule (100 mg total) by mouth 3 (three) times daily., Disp: 90 capsule, Rfl: 5 .  isosorbide-hydrALAZINE (BIDIL) 20-37.5 MG tablet, Take 1 tablet by mouth 3 (three) times daily., Disp: 90 tablet, Rfl: 0  Orders Placed This Encounter  Procedures  . EKG 12-Lead   --Continue cardiac medications as reconciled in final medication list. --Return in about 4 weeks (around 12/14/2019) for re-evaluation of symptoms and HF management. . Or sooner if needed. --Continue follow-up with your primary care physician  regarding the management of your other chronic comorbid conditions.  Total encounter time 40 minutes Total Encounter Time as defined by the Centers for Medicare and Medicaid Services includes, in addition to the face-to-face time of a patient visit (documented in the note above) non-face-to-face time: obtaining and reviewing outside history, ordering and reviewing medications, tests or procedures, care coordination (communications with other health care professionals or caregivers) and documentation in the medical record.  Patient's questions and concerns were addressed to his satisfaction. He voices understanding of the instructions provided during this encounter.   This note was created using a voice recognition software as a result there may be grammatical errors inadvertently enclosed that do not reflect the nature of this encounter. Every attempt is made to correct such errors.  Rex Kras, [place], [facility]  Pager: [phone] Office: [phone]

## 2019-11-16 NOTE — Patient Instructions (Addendum)
Place a pillow between your feet for sleep   Peripheral Neuropathy Peripheral neuropathy is a type of nerve damage. It affects nerves that carry signals between the spinal cord and the arms, legs, and the rest of the body (peripheral nerves). It does not affect nerves in the spinal cord or brain. In peripheral neuropathy, one nerve or a group of nerves may be damaged. Peripheral neuropathy is a broad category that includes many specific nerve disorders, like diabetic neuropathy, hereditary neuropathy, and carpal tunnel syndrome. What are the causes? This condition may be caused by:  Diabetes. This is the most common cause of peripheral neuropathy.  Nerve injury.  Pressure or stress on a nerve that lasts a long time.  Lack (deficiency) of B vitamins. This can result from alcoholism, poor diet, or a restricted diet.  Infections.  Autoimmune diseases, such as rheumatoid arthritis and systemic lupus erythematosus.  Nerve diseases that are passed from parent to child (inherited).  Some medicines, such as cancer medicines (chemotherapy).  Poisonous (toxic) substances, such as lead and mercury.  Too little blood flowing to the legs.  Kidney disease.  Thyroid disease. In some cases, the cause of this condition is not known. What are the signs or symptoms? Symptoms of this condition depend on which of your nerves is damaged. Common symptoms include:  Loss of feeling (numbness) in the feet, hands, or both.  Tingling in the feet, hands, or both.  Burning pain.  Very sensitive skin.  Weakness.  Not being able to move a part of the body (paralysis).  Muscle twitching.  Clumsiness or poor coordination.  Loss of balance.  Not being able to control your bladder.  Feeling dizzy.  Sexual problems. How is this diagnosed? Diagnosing and finding the cause of peripheral neuropathy can be difficult. Your health care provider will take your medical history and do a physical exam. A  neurological exam will also be done. This involves checking things that are affected by your brain, spinal cord, and nerves (nervous system). For example, your health care provider will check your reflexes, how you move, and what you can feel. You may have other tests, such as:  Blood tests.  Electromyogram (EMG) and nerve conduction tests. These tests check nerve function and how well the nerves are controlling the muscles.  Imaging tests, such as CT scans or MRI to rule out other causes of your symptoms.  Removing a small piece of nerve to be examined in a lab (nerve biopsy). This is rare.  Removing and examining a small amount of the fluid that surrounds the brain and spinal cord (lumbar puncture). This is rare. How is this treated? Treatment for this condition may involve:  Treating the underlying cause of the neuropathy, such as diabetes, kidney disease, or vitamin deficiencies.  Stopping medicines that can cause neuropathy, such as chemotherapy.  Medicine to relieve pain. Medicines may include: ? Prescription or over-the-counter pain medicine. ? Antiseizure medicine. ? Antidepressants. ? Pain-relieving patches that are applied to painful areas of skin.  Surgery to relieve pressure on a nerve or to destroy a nerve that is causing pain.  Physical therapy to help improve movement and balance.  Devices to help you move around (assistive devices). Follow these instructions at home: Medicines  Take over-the-counter and prescription medicines only as told by your health care provider. Do not take any other medicines without first asking your health care provider.  Do not drive or use heavy machinery while taking prescription pain medicine. Lifestyle  Do not use any products that contain nicotine or tobacco, such as cigarettes and e-cigarettes. Smoking keeps blood from reaching damaged nerves. If you need help quitting, ask your health care provider.  Avoid or limit alcohol.  Too much alcohol can cause a vitamin B deficiency, and vitamin B is needed for healthy nerves.  Eat a healthy diet. This includes: ? Eating foods that are high in fiber, such as fresh fruits and vegetables, whole grains, and beans. ? Limiting foods that are high in fat and processed sugars, such as fried or sweet foods. General instructions   If you have diabetes, work closely with your health care provider to keep your blood sugar under control.  If you have numbness in your feet: ? Check every day for signs of injury or infection. Watch for redness, warmth, and swelling. ? Wear padded socks and comfortable shoes. These help protect your feet.  Develop a good support system. Living with peripheral neuropathy can be stressful. Consider talking with a mental health specialist or joining a support group.  Use assistive devices and attend physical therapy as told by your health care provider. This may include using a walker or a cane.  Keep all follow-up visits as told by your health care provider. This is important. Contact a health care provider if:  You have new signs or symptoms of peripheral neuropathy.  You are struggling emotionally from dealing with peripheral neuropathy.  Your pain is not well-controlled. Get help right away if:  You have an injury or infection that is not healing normally.  You develop new weakness in an arm or leg.  You fall frequently. Summary  Peripheral neuropathy is when the nerves in the arms, or legs are damaged, resulting in numbness, weakness, or pain.  There are many causes of peripheral neuropathy, including diabetes, pinched nerves, vitamin deficiencies, autoimmune disease, and hereditary conditions.  Diagnosing and finding the cause of peripheral neuropathy can be difficult. Your health care provider will take your medical history, do a physical exam, and do tests, including blood tests and nerve function tests.  Treatment involves  treating the underlying cause of the neuropathy and taking medicines to help control pain. Physical therapy and assistive devices may also help. This information is not intended to replace advice given to you by your health care provider. Make sure you discuss any questions you have with your health care provider. Document Revised: 06/05/2017 Document Reviewed: 09/01/2016 Elsevier Patient Education  [place] are small areas of thickened skin that occur on the top, sides, or tip of a toe. They contain a cone-shaped core with a point that can press on a nerve below. This causes pain.  Calluses are areas of thickened skin that can occur anywhere on the body, including the hands, fingers, palms, soles of the feet, and heels. Calluses are usually larger than corns. What are the causes? Corns and calluses are caused by rubbing (friction) or pressure, such as from shoes that are too tight or do not fit properly. What increases the risk? Corns are more likely to develop in people who have misshapen toes (toe deformities), such as hammer toes. Calluses can occur with friction to any area of the skin. They are more likely to develop in people who:  Work with their hands.  Wear shoes that fit poorly, are too tight, or are high-heeled.  Have toe deformities. What are the signs or symptoms? Symptoms of a corn or callus include:  A hard growth on the skin.  Pain or tenderness under the skin.  Redness and swelling.  Increased discomfort while wearing tight-fitting shoes, if your feet are affected. If a corn or callus becomes infected, symptoms may include:  Redness and swelling that gets worse.  Pain.  Fluid, blood, or pus draining from the corn or callus. How is this diagnosed? Corns and calluses may be diagnosed based on your symptoms, your medical history, and a physical exam. How is this treated? Treatment for corns and calluses may  include:  Removing the cause of the friction or pressure. This may involve: ? Changing your shoes. ? Wearing shoe inserts (orthotics) or other protective layers in your shoes, such as a corn pad. ? Wearing gloves.  Applying medicine to the skin (topical medicine) to help soften skin in the hardened, thickened areas.  Removing layers of dead skin with a file to reduce the size of the corn or callus.  Removing the corn or callus with a scalpel or laser.  Taking antibiotic medicines, if your corn or callus is infected.  Having surgery, if a toe deformity is the cause. Follow these instructions at home:   Take over-the-counter and prescription medicines only as told by your health care provider.  If you were prescribed an antibiotic, take it as told by your health care provider. Do not stop taking it even if your condition starts to improve.  Wear shoes that fit well. Avoid wearing high-heeled shoes and shoes that are too tight or too loose.  Wear any padding, protective layers, gloves, or orthotics as told by your health care provider.  Soak your hands or feet and then use a file or pumice stone to soften your corn or callus. Do this as told by your health care provider.  Check your corn or callus every day for symptoms of infection. Contact a health care provider if you:  Notice that your symptoms do not improve with treatment.  Have redness or swelling that gets worse.  Notice that your corn or callus becomes painful.  Have fluid, blood, or pus coming from your corn or callus.  Have new symptoms. Summary  Corns are small areas of thickened skin that occur on the top, sides, or tip of a toe.  Calluses are areas of thickened skin that can occur anywhere on the body, including the hands, fingers, palms, and soles of the feet. Calluses are usually larger than corns.  Corns and calluses are caused by rubbing (friction) or pressure, such as from shoes that are too tight or do  not fit properly.  Treatment may include wearing any padding, protective layers, gloves, or orthotics as told by your health care provider. This information is not intended to replace advice given to you by your health care provider. Make sure you discuss any questions you have with your health care provider. Document Revised: 10/13/2018 Document Reviewed: 05/06/2017 Elsevier Patient Education  2020 [employer].

## 2019-11-16 NOTE — Progress Notes (Signed)
Subjective: Jason OELKERS Sr. is a 74 y.o. male patient seen today at risk foot care with history of peripheral neuropathy, at risk foot care. Patient has h/o ESRD on hemodialysis and painful mycotic nails b/l that are difficult to trim. Pain interferes with ambulation. Aggravating factors include wearing enclosed shoe gear. Pain is relieved with periodic professional debridement.  He states his plantar fasciitis is improved. He continues to wear his plantar fascial brace daily. He voices no new pedal concerns on today's visit.  Patient Active Problem List   Diagnosis Date Noted  . Meralgia paraesthetica, right 08/12/2019  . 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 Gait disturbance, post-stroke 04/17/2019  . Long term (current) use of anticoagulants 12/31/2018  . Atrial fibrillation, unspecified type ([place]) 12/22/2018  . Longstanding persistent atrial fibrillation ([place]) 09/20/2018  . Functional diarrhea 07/14/2018  . Benign essential HTN   . Hyperglycemia   . ESRD on dialysis ([place]) 06/16/2018  . Mild malnutrition ([place]) 06/16/2018  . Stroke due to embolism ([place]) 06/16/2018  . CVA (cerebral vascular accident) ([place]) 06/08/2018  . CHF (congestive heart failure) ([place]) 05/08/2018  . Hyperlipidemia LDL goal <70 09/23/2017  . OAB (overactive bladder) 01/09/2016  . SUI (stress urinary incontinence), male 01/09/2016  . Hypersomnia 10/30/2015  . Obesity (BMI 30.0-34.9) 10/30/2015  . Routine general medical examination at a health care facility 06/04/2015  . Medicare annual wellness visit, subsequent 06/04/2015  . Hereditary and idiopathic peripheral neuropathy 09/14/2014  . Rectal cancer ([place]) 02/01/2013  . Obstructive sleep apnea 07/29/2010  . GOUT 05/11/2007  . ERECTILE DYSFUNCTION, MILD 05/11/2007    Current Outpatient Medications on File Prior to Visit  Medication Sig Dispense Refill  . Lanthanum Carbonate (FOSRENOL) 1000 MG PACK Take by mouth.    . Methoxy  PEG-Epoetin Beta (MIRCERA IJ) Mircera    . acetaminophen (TYLENOL) 325 MG tablet Take 325 mg by mouth every 4 (four) hours as needed for mild pain or moderate pain.     Marland Kitchen allopurinol (ZYLOPRIM) 100 MG tablet Take 1 tablet by mouth once daily (Patient taking differently: Take 100 mg by mouth daily. ) 90 tablet 0  . atorvastatin (LIPITOR) 20 MG tablet TAKE 1 TABLET BY MOUTH  DAILY AT 6 PM. (Patient taking differently: Take 20 mg by mouth daily at 6 PM. ) 90 tablet 1  . cinacalcet (SENSIPAR) 60 MG tablet Take 60 mg by mouth daily.    . Eluxadoline 75 MG TABS Take 75 mg by mouth in the morning and at bedtime.    . ferric citrate (AURYXIA) 1 GM 210 MG(Fe) tablet Take 420 mg by mouth See admin instructions. Take 420 mg with breakfast, 420 mg with dinner, and 420 mg with a snack at bedtime    . gabapentin (NEURONTIN) 100 MG capsule Take 1 capsule (100 mg total) by mouth 3 (three) times daily. 90 capsule 5  . lipase/protease/amylase (CREON) 36000 UNITS CPEP capsule Take 1 capsule (36,000 Units total) by mouth 3 (three) times daily with meals. (Patient taking differently: Take 36,000 Units by mouth 3 (three) times daily before meals. ) 56 capsule 0  . loperamide (IMODIUM) 2 MG capsule Take 2 mg by mouth as needed for diarrhea or loose stools.    . metoprolol succinate (TOPROL-XL) 25 MG 24 hr tablet Take 1 tablet (25 mg total) by mouth daily. Please schedule appointment for refills 90 tablet 1  . polycarbophil (FIBERCON) 625 MG tablet Take 625 mg by mouth daily as needed for  diarrhea or loose stools.    . sucroferric oxyhydroxide (VELPHORO) 500 MG chewable tablet Chew 500 mg by mouth 3 (three) times daily with meals.    . warfarin (COUMADIN) 5 MG tablet Take 1 tablet daily except 1/2 tablet on Mon and Fridays or TAKE AS DIRECTED BY ANTICOAGULATION CLINIC (Patient taking differently: Take 2.5-5 mg by mouth See admin instructions. Take 1 tablet daily except 1/2 tablet on Mon and Fridays or TAKE AS DIRECTED BY  ANTICOAGULATION CLINIC) 90 tablet 1  . [DISCONTINUED] atorvastatin (LIPITOR) 20 MG tablet TAKE 1 TABLET BY MOUTH  DAILY AT 6 PM. 90 tablet 1   No current facility-administered medications on file prior to visit.    Allergies  Allergen Reactions  . Nsaids Other (See Comments)    Asked by surgeon to add this medication class as intolerance due to patients renal insufficiency.  . Lactase Other (See Comments)  . Lactose Intolerance (Gi) Diarrhea  . Phentermine Other (See Comments)  . Amlodipine Other (See Comments)    Edema     Objective: Physical Exam  General: Mr.  Jason MANCUSO Sr. is a pleasant 74 y.o. y.o. AA male,  WD, WN in NAD. AAO x 3.   Vascular:  Capillary fill time to digits <3 seconds b/l. Palpable PT pulses b/l. Faintly palpable DP pulses b/l. Pedal hair absent b/l Skin temperature gradient within normal limits b/l. No edema noted b/l. No pain with calf compression b/l.  Dermatological:  Pedal skin is thin shiny, atrophic bilaterally. No open wounds bilaterally. No interdigital macerations bilaterally. Toenails 1-5 b/l elongated, dystrophic, thickened, crumbly with subungual debris and tenderness to dorsal palpation. Resolved hyperkeratotic lesions b/l 5th digits.  Musculoskeletal:  Normal muscle strength 5/5 to all lower extremity muscle groups bilaterally. No pain crepitus or joint limitation noted with ROM b/l. Hallux valgus with bunion deformity noted b/l. Hammertoes noted to the L 5th toe and R 5th toe. Patient ambulates independent of any assistive aids.  Neurological:  Protective sensation diminished with 10g monofilament b/l.  Assessment and Plan:  1. Pain due to onychomycosis of toenails of both feet   2. Neuropathy   3. ESRD on dialysis [facility])    -Examined patient. -No new findings. No new orders. -Toenails 1-5 b/l were debrided in length and girth with sterile nail nippers and dremel without iatrogenic bleeding.  -Corns have resolved b/l 5th digits on  this visit. -Patient to continue soft, supportive shoe gear daily. -Patient to report any pedal injuries to medical professional immediately. -Patient/POA to call should there be question/concern in the interim.  Return in about 9 weeks (around 01/18/2020) for nail trim.  Marzetta Board, DPM

## 2019-11-17 ENCOUNTER — Ambulatory Visit: Payer: Medicare HMO

## 2019-11-18 ENCOUNTER — Telehealth: Payer: Self-pay | Admitting: Gastroenterology

## 2019-11-18 ENCOUNTER — Other Ambulatory Visit (HOSPITAL_COMMUNITY)
Admission: RE | Admit: 2019-11-18 | Discharge: 2019-11-18 | Disposition: A | Discharge status: Home / Self Care | Payer: Medicare HMO | Source: Ambulatory Visit | Attending: Gastroenterology | Admitting: Gastroenterology

## 2019-11-18 DIAGNOSIS — G4733 Obstructive sleep apnea (adult) (pediatric): Secondary | ICD-10-CM | POA: Diagnosis not present

## 2019-11-18 DIAGNOSIS — Z01812 Encounter for preprocedural laboratory examination: Secondary | ICD-10-CM | POA: Diagnosis not present

## 2019-11-18 DIAGNOSIS — Z20822 Contact with and (suspected) exposure to covid-19: Secondary | ICD-10-CM | POA: Diagnosis not present

## 2019-11-18 LAB — SARS CORONAVIRUS 2 (TAT 6-24 HRS): SARS Coronavirus 2: NEGATIVE

## 2019-11-18 NOTE — Telephone Encounter (Signed)
Wife wanted to know if pt could do to dialysis tomorrow as he had covid test today. Let pts wife he is to go to dialysis tomorrow as scheduled.

## 2019-11-19 ENCOUNTER — Telehealth: Payer: Self-pay | Admitting: Physician Assistant

## 2019-11-21 ENCOUNTER — Telehealth: Payer: Self-pay

## 2019-11-21 DIAGNOSIS — M21372 Foot drop, left foot: Secondary | ICD-10-CM | POA: Diagnosis not present

## 2019-11-21 NOTE — Telephone Encounter (Signed)
-----   Message from Alfredia Ferguson, PA-C sent at 11/19/2019  2:49 PM EDT ----- Regarding: Sonda Primes- pt called today asking  how to take lovenox and when to start it !   I instructed him to take Lovenox today x1, then Sunday x 1 in am , and Monday x 1 in am -then Stop as procedures are for Tuesday at hospital  Off Coumadin -last dose wed 5/12  Spoke with Dr Loletha Carrow - we are worried he will get dosing messed up, take too much or not stop as of Monday am   Please call him Monday am and be sure he stops Lovenox after am dose Monday !  Not sure he needed a bridge , he will not need Lovenox after procedure , Dr Loletha Carrow will resume coumadin- Thanks !

## 2019-11-21 NOTE — Telephone Encounter (Signed)
Spoke with pt and he took his lovenox this am. Discussed with pt that he is not to take any more after that morning dose he took this am. Pt verbalized understanding.

## 2019-11-22 ENCOUNTER — Ambulatory Visit (HOSPITAL_COMMUNITY): Payer: Medicare HMO | Admitting: Anesthesiology

## 2019-11-22 ENCOUNTER — Encounter (HOSPITAL_COMMUNITY)
Admission: RE | Disposition: A | Discharge status: Home / Self Care | Payer: Self-pay | Source: Home / Self Care | Attending: Gastroenterology

## 2019-11-22 ENCOUNTER — Ambulatory Visit (HOSPITAL_COMMUNITY)
Admission: RE | Admit: 2019-11-22 | Discharge: 2019-11-22 | Disposition: A | Discharge status: Home / Self Care | Payer: Medicare HMO | Attending: Gastroenterology | Admitting: Gastroenterology

## 2019-11-22 ENCOUNTER — Encounter (HOSPITAL_COMMUNITY): Payer: Self-pay | Admitting: Gastroenterology

## 2019-11-22 ENCOUNTER — Other Ambulatory Visit: Payer: Self-pay

## 2019-11-22 DIAGNOSIS — K635 Polyp of colon: Secondary | ICD-10-CM

## 2019-11-22 DIAGNOSIS — Z87891 Personal history of nicotine dependence: Secondary | ICD-10-CM | POA: Insufficient documentation

## 2019-11-22 DIAGNOSIS — Z09 Encounter for follow-up examination after completed treatment for conditions other than malignant neoplasm: Secondary | ICD-10-CM | POA: Insufficient documentation

## 2019-11-22 DIAGNOSIS — K621 Rectal polyp: Secondary | ICD-10-CM | POA: Diagnosis not present

## 2019-11-22 DIAGNOSIS — Z992 Dependence on renal dialysis: Secondary | ICD-10-CM | POA: Diagnosis not present

## 2019-11-22 DIAGNOSIS — N186 End stage renal disease: Secondary | ICD-10-CM | POA: Diagnosis not present

## 2019-11-22 DIAGNOSIS — Z01818 Encounter for other preprocedural examination: Secondary | ICD-10-CM

## 2019-11-22 DIAGNOSIS — Z86010 Personal history of colonic polyps: Secondary | ICD-10-CM | POA: Diagnosis not present

## 2019-11-22 DIAGNOSIS — I509 Heart failure, unspecified: Secondary | ICD-10-CM | POA: Insufficient documentation

## 2019-11-22 DIAGNOSIS — I132 Hypertensive heart and chronic kidney disease with heart failure and with stage 5 chronic kidney disease, or end stage renal disease: Secondary | ICD-10-CM | POA: Diagnosis not present

## 2019-11-22 DIAGNOSIS — I4891 Unspecified atrial fibrillation: Secondary | ICD-10-CM | POA: Diagnosis not present

## 2019-11-22 DIAGNOSIS — M1990 Unspecified osteoarthritis, unspecified site: Secondary | ICD-10-CM | POA: Diagnosis not present

## 2019-11-22 DIAGNOSIS — Z8673 Personal history of transient ischemic attack (TIA), and cerebral infarction without residual deficits: Secondary | ICD-10-CM | POA: Diagnosis not present

## 2019-11-22 DIAGNOSIS — K529 Noninfective gastroenteritis and colitis, unspecified: Secondary | ICD-10-CM

## 2019-11-22 DIAGNOSIS — K624 Stenosis of anus and rectum: Secondary | ICD-10-CM | POA: Insufficient documentation

## 2019-11-22 DIAGNOSIS — C20 Malignant neoplasm of rectum: Secondary | ICD-10-CM | POA: Diagnosis not present

## 2019-11-22 DIAGNOSIS — K5730 Diverticulosis of large intestine without perforation or abscess without bleeding: Secondary | ICD-10-CM | POA: Insufficient documentation

## 2019-11-22 DIAGNOSIS — G4733 Obstructive sleep apnea (adult) (pediatric): Secondary | ICD-10-CM | POA: Insufficient documentation

## 2019-11-22 DIAGNOSIS — Z7902 Long term (current) use of antithrombotics/antiplatelets: Secondary | ICD-10-CM

## 2019-11-22 DIAGNOSIS — D122 Benign neoplasm of ascending colon: Secondary | ICD-10-CM | POA: Insufficient documentation

## 2019-11-22 DIAGNOSIS — J449 Chronic obstructive pulmonary disease, unspecified: Secondary | ICD-10-CM | POA: Insufficient documentation

## 2019-11-22 DIAGNOSIS — I110 Hypertensive heart disease with heart failure: Secondary | ICD-10-CM | POA: Diagnosis not present

## 2019-11-22 HISTORY — PX: HEMOSTASIS CLIP PLACEMENT: SHX6857

## 2019-11-22 HISTORY — PX: POLYPECTOMY: SHX5525

## 2019-11-22 HISTORY — PX: COLONOSCOPY WITH PROPOFOL: SHX5780

## 2019-11-22 HISTORY — PX: BIOPSY: SHX5522

## 2019-11-22 LAB — POCT I-STAT, CHEM 8
BUN: 27 mg/dL — ABNORMAL HIGH (ref 8–23)
Calcium, Ion: 1.03 mmol/L — ABNORMAL LOW (ref 1.15–1.40)
Chloride: 86 mmol/L — ABNORMAL LOW (ref 98–111)
Creatinine, Ser: 6.5 mg/dL — ABNORMAL HIGH (ref 0.61–1.24)
Glucose, Bld: 78 mg/dL (ref 70–99)
HCT: 40 % (ref 39.0–52.0)
Hemoglobin: 13.6 g/dL (ref 13.0–17.0)
Potassium: 3.3 mmol/L — ABNORMAL LOW (ref 3.5–5.1)
Sodium: 129 mmol/L — ABNORMAL LOW (ref 135–145)
TCO2: 33 mmol/L — ABNORMAL HIGH (ref 22–32)

## 2019-11-22 LAB — HM COLONOSCOPY

## 2019-11-22 LAB — PROTIME-INR
INR: 1.7 — ABNORMAL HIGH (ref 0.8–1.2)
Prothrombin Time: 19.5 seconds — ABNORMAL HIGH (ref 11.4–15.2)

## 2019-11-22 SURGERY — COLONOSCOPY WITH PROPOFOL
Anesthesia: Monitor Anesthesia Care

## 2019-11-22 MED ORDER — LIDOCAINE HCL 1 % IJ SOLN
INTRAMUSCULAR | Status: DC | PRN
  Administered 2019-11-22: 40 mg via INTRADERMAL

## 2019-11-22 MED ORDER — SODIUM CHLORIDE 0.9 % IV SOLN: INTRAVENOUS | Status: DC

## 2019-11-22 MED ORDER — PROPOFOL 500 MG/50ML IV EMUL
INTRAVENOUS | Status: DC | PRN
  Administered 2019-11-22: 100 ug/kg/min via INTRAVENOUS

## 2019-11-22 MED ORDER — PROPOFOL 10 MG/ML IV BOLUS
INTRAVENOUS | Status: AC
  Filled 2019-11-22: qty 20

## 2019-11-22 MED ORDER — PROPOFOL 500 MG/50ML IV EMUL
INTRAVENOUS | Status: AC
  Filled 2019-11-22: qty 50

## 2019-11-22 MED ORDER — SODIUM CHLORIDE 0.9 % IV SOLN
INTRAVENOUS | Status: DC | PRN
Start: 2019-11-22 — End: 2019-11-22

## 2019-11-22 MED ORDER — LACTATED RINGERS IV SOLN: INTRAVENOUS | Status: DC | PRN

## 2019-11-22 MED ORDER — PROPOFOL 10 MG/ML IV BOLUS
INTRAVENOUS | Status: DC | PRN
  Administered 2019-11-22: 5 mg via INTRAVENOUS
  Administered 2019-11-22 (×2): 10 mg via INTRAVENOUS

## 2019-11-22 SURGICAL SUPPLY — 22 items

## 2019-11-22 NOTE — Interval H&P Note (Signed)
History and Physical Interval Note:  11/22/2019 8:17 AM  Jason Cotta Sr.  has presented today for surgery, with the diagnosis of Hx of colon polyps.  The various methods of treatment have been discussed with the patient and family. After consideration of risks, benefits and other options for treatment, the patient has consented to  Procedure(s) with comments: COLONOSCOPY WITH PROPOFOL (N/A) - Hx of colon polyps tt  as a surgical intervention.  The patient's history has been reviewed, patient examined, no change in status, stable for surgery.  I have reviewed the patient's chart and labs.  Questions were answered to the patient's satisfaction.     [name]

## 2019-11-22 NOTE — H&P (Signed)
History:  This patient presents for endoscopic testing for colon polyp.  Jason K Krass Sr. Referring physician: [name], MD  Past Medical History: Past Medical History:  Diagnosis Date  . Anemia   . Arthritis   . Atrial fibrillation ([place])    on Coumadin   . Chronic renal insufficiency 07/31/2011  . COPD (chronic obstructive pulmonary disease) ([place])    pt denies  . CVA (cerebral vascular accident) ([place]) 06/2018  . Deficiency anemia 07/20/2012  . Diverticulosis   . Edema 09/21/2010   Qualifier: Diagnosis of  By: Jerold Coombe    . ERECTILE DYSFUNCTION, MILD 05/11/2007   Qualifier: Diagnosis of  By: Sherren Mocha MD, Jory Ee   . Exertional dyspnea 10/30/2015  . GOUT 05/11/2007   Qualifier: Diagnosis of  By: Sherren Mocha MD, Jory Ee   . Hereditary and idiopathic peripheral neuropathy 09/14/2014  . History of radiation therapy 02/21/13-03/31/13   rectum 50.4Gy total dose  . Hypersomnia 10/30/2015  . Hypertension   . Lung nodule 10/30/2015  . OAB (overactive bladder) 01/09/2016  . Obesity (BMI 30.0-34.9) 10/30/2015  . Obstructive sleep apnea 07/29/2010   HST 11/2015 AHI 54.  On autocpap  >> 10 cm   . Peripheral edema 09/19/2013  . rectal ca dx'd 02/01/13   rectal. Radiation and chemotherapy- remains on chemotherapy-next tx. 08-22-13.   . S/P partial lobectomy of lung 11/20/2015  . S/P thoracotomy   . Sleep apnea    wears cpap   . SUI (stress urinary incontinence), male 01/09/2016     Past Surgical History: Past Surgical History:  Procedure Laterality Date  . A/V FISTULAGRAM Left 06/14/2018   Procedure: A/V FISTULAGRAM;  Surgeon: Waynetta Sandy, MD;  Location: [facility];  Service: Cardiovascular;  Laterality: Left;  . AV FISTULA PLACEMENT Left 03/24/2016   Procedure: CREATION OF LEFT BRACIOCEPHALIC ARTERIOVENOUS (AV) FISTULA;  Surgeon: Elam Dutch, MD;  Location: [facility];  Service: Vascular;  Laterality: Left;  . BIOPSY  09/17/2018   Procedure: BIOPSY;  Surgeon: Doran Stabler, MD;  Location[facility];  Service: Gastroenterology;;  . BIOPSY  03/09/2019   Procedure: BIOPSY;  Surgeon: Doran Stabler, MD;  Location: [facility];  Service: Gastroenterology;;  . COLON SURGERY  05/20/2013  . COLONOSCOPY  2010   [facility]  . COLONOSCOPY WITH PROPOFOL N/A 09/17/2018   Procedure: COLONOSCOPY WITH PROPOFOL;  Surgeon: Doran Stabler, MD;  Location: [facility];  Service: Gastroenterology;  Laterality: N/A;  . ESOPHAGOGASTRODUODENOSCOPY (EGD) WITH PROPOFOL N/A 09/17/2018   Procedure: ESOPHAGOGASTRODUODENOSCOPY (EGD) WITH PROPOFOL;  Surgeon: Doran Stabler, MD;  [facility]: [facility];  Service: Gastroenterology;  Laterality: N/A;  . ESOPHAGOGASTRODUODENOSCOPY (EGD) WITH PROPOFOL N/A 03/09/2019   Procedure: ESOPHAGOGASTRODUODENOSCOPY (EGD) WITH PROPOFOL;  Surgeon: Doran Stabler, MD;  Location: [facility];  Service: Gastroenterology;  Laterality: N/A;  . EUS N/A 02/03/2013   Procedure: LOWER ENDOSCOPIC ULTRASOUND (EUS);  Surgeon: Milus Banister, MD;  Location: [facility];  Service: Endoscopy;  Laterality: N/A;  . ILEOSTOMY CLOSURE N/A 08/24/2013   Procedure: CLOSURE OF LOOP ILEOSTOMY ;  Surgeon: Leighton Ruff, MD;  Location: [facility];  Service: General;  Laterality: N/A;  . IR ANGIO VERTEBRAL SEL SUBCLAVIAN INNOMINATE BILAT MOD SED  06/08/2018  . IR CT HEAD LTD  06/08/2018  . IR INTRAVSC STENT CERV CAROTID W/O EMB-PROT MOD SED INC ANGIO  06/08/2018  . IR PERCUTANEOUS ART THROMBECTOMY/INFUSION INTRACRANIAL INC DIAG ANGIO  06/08/2018  .  LAPAROSCOPIC LOW ANTERIOR RESECTION N/A 05/20/2013   Procedure: LAPAROSCOPIC LOW ANTERIOR RESECTION WITH SPLENIC FLEXURE MOBILIZATION;  Surgeon: Leighton Ruff, MD;  Location: [facility];  Service: General;  Laterality: N/A;  . OSTOMY N/A 05/20/2013   Procedure: diverting OSTOMY;  Surgeon: Leighton Ruff, MD;  Location: [facility];  Service: General;  Laterality: N/A;  . PERIPHERAL VASCULAR INTERVENTION Left 06/14/2018   Procedure:  PERIPHERAL VASCULAR INTERVENTION;  Surgeon: Waynetta Sandy, MD;  Location: [facility];  Service: Cardiovascular;  Laterality: Left;  AVF on the Left  . POLYPECTOMY    . RADIOLOGY WITH ANESTHESIA N/A 06/08/2018   Procedure: IR WITH ANESTHESIA;  Surgeon: Radiologist, Medication, MD;  Location: [facility];  Service: Radiology;  Laterality: N/A;  . UPPER GASTROINTESTINAL ENDOSCOPY    . VIDEO ASSISTED THORACOSCOPY (VATS)/WEDGE RESECTION Left 11/20/2015   Procedure: VIDEO ASSISTED THORACOSCOPY (VATS)/LUNG RESECTION;  Surgeon: [name], MD;  Location: [facility];  Service: Thoracic;  Laterality: Left;  Marland Kitchen VIDEO BRONCHOSCOPY N/A 11/20/2015   Procedure: VIDEO BRONCHOSCOPY;  Surgeon: [name], MD;  [facility];  Service: Thoracic;  Laterality: N/A;    Allergies: Allergies  Allergen Reactions  . Nsaids Other (See Comments)    Asked by surgeon to add this medication class as intolerance due to patients renal insufficiency.  . Lactase Other (See Comments)  . Lactose Intolerance (Gi) Diarrhea  . Phentermine Other (See Comments)  . Amlodipine Other (See Comments)    Edema     Outpatient Meds: Current Facility-Administered Medications  Medication Dose Route Frequency Provider Last Rate Last Admin  . 0.9 %  sodium chloride infusion   Intravenous Continuous Danis, Estill Cotta III, MD          ___________________________________________________________________ Objective   Exam:  BP (!) 163/72   Temp (!) 97 F (36.1 C) (Oral)   Resp 14   Ht 5\' 8"  (1.727 m)   Wt 68 kg   SpO2 96%   BMI 22.81 kg/m    CV: RRR without murmur, S1/S2, no JVD, no peripheral edema  Resp: clear to auscultation bilaterally, normal RR and effort noted  GI: soft, no tenderness, with active bowel sounds. No guarding or palpable organomegaly noted.  Neuro: awake, alert and oriented x 3. Normal gross motor function and fluent speech   Assessment:  Hx colon polyp  Plan:  Colonoscopy  with Bx (chronic diarrhea) and polypectomy Patient has been off coumadin and on bridging lovenox   [name]

## 2019-11-22 NOTE — Transfer of Care (Signed)
Immediate Anesthesia Transfer of Care Note  Patient: Jason Cotta Sr.  Procedure(s) Performed: COLONOSCOPY WITH PROPOFOL (N/A ) POLYPECTOMY HEMOSTASIS CLIP PLACEMENT BIOPSY  Patient Location: PACU and Endoscopy Unit  Anesthesia Type:MAC  Level of Consciousness: awake, oriented and patient cooperative  Airway & Oxygen Therapy: Patient Spontanous Breathing and Patient connected to face mask oxygen  Post-op Assessment: Report given to RN and Post -op Vital signs reviewed and stable  Post vital signs: Reviewed and stable  Last Vitals:  Vitals Value Taken Time  BP    Temp    Pulse 67 11/22/19 0950  Resp 13 11/22/19 0950  SpO2 100 % 11/22/19 0950  Vitals shown include unvalidated device data.  Last Pain:  Vitals:   11/22/19 0806  TempSrc: Oral  PainSc: 0-No pain         Complications: No apparent anesthesia complications

## 2019-11-22 NOTE — Anesthesia Postprocedure Evaluation (Signed)
Anesthesia Post Note  Patient: Jason LANCE Sr.  Procedure(s) Performed: COLONOSCOPY WITH PROPOFOL (N/A ) POLYPECTOMY HEMOSTASIS CLIP PLACEMENT BIOPSY     Patient location during evaluation: PACU Anesthesia Type: MAC Level of consciousness: awake and alert Pain management: pain level controlled Vital Signs Assessment: post-procedure vital signs reviewed and stable Respiratory status: spontaneous breathing, nonlabored ventilation, respiratory function stable and patient connected to nasal cannula oxygen Cardiovascular status: stable and blood pressure returned to baseline Postop Assessment: no apparent nausea or vomiting Anesthetic complications: no    Last Vitals:  Vitals:   11/22/19 0950 11/22/19 1000  BP: (!) 140/57 (!) 142/69  Pulse: 67 67  Resp: 13 (!) 22  Temp:    SpO2: 100% 100%    Last Pain:  Vitals:   11/22/19 1010  TempSrc:   PainSc: 0-No pain                 Tiajuana Amass

## 2019-11-22 NOTE — Anesthesia Preprocedure Evaluation (Signed)
Anesthesia Evaluation  Patient identified by MRN, date of birth, ID band Patient awake    Reviewed: Allergy & Precautions, NPO status , Patient's Chart, lab work & pertinent test results, reviewed documented beta blocker date and time   History of Anesthesia Complications Negative for: history of anesthetic complications  Airway Mallampati: II  TM Distance: >3 FB Neck ROM: Full    Dental no notable dental hx.    Pulmonary sleep apnea and Continuous Positive Airway Pressure Ventilation , COPD, former smoker,  S/p wedge resection   Pulmonary exam normal breath sounds clear to auscultation       Cardiovascular hypertension, Pt. on home beta blockers and Pt. on medications +CHF  Normal cardiovascular exam+ dysrhythmias (on Coumadin) Atrial Fibrillation  Rhythm:Regular Rate:Normal  TTE 2019: EF 20-25%, diffuse hypokinesis, mild LAE, mild RAE    Neuro/Psych CVA (2019) negative psych ROS   GI/Hepatic Neg liver ROS, Rectal ca   Endo/Other  negative endocrine ROS  Renal/GU ESRF and DialysisRenal disease (HD T/Th/Sa)  negative genitourinary   Musculoskeletal  (+) Arthritis ,   Abdominal   Peds  Hematology  (+) anemia ,   Anesthesia Other Findings   Reproductive/Obstetrics negative OB ROS                            Anesthesia Physical  Anesthesia Plan  ASA: III  Anesthesia Plan: MAC   Post-op Pain Management:    Induction: Intravenous  PONV Risk Score and Plan: 2 and Treatment may vary due to age or medical condition, Propofol infusion and Ondansetron  Airway Management Planned: Nasal Cannula and Natural Airway  Additional Equipment:   Intra-op Plan:   Post-operative Plan:   Informed Consent: I have reviewed the patients History and Physical, chart, labs and discussed the procedure including the risks, benefits and alternatives for the proposed anesthesia with the patient or  authorized representative who has indicated his/her understanding and acceptance.     Dental advisory given  Plan Discussed with: CRNA  Anesthesia Plan Comments:        Anesthesia Quick Evaluation

## 2019-11-22 NOTE — Discharge Instructions (Signed)
START YOUR COUMADIN AT REGULAR DOSE TONIGHT. CONTACT THE COUMADIN CLINIC TO ARRANGE YOUR COUMADIN LEVEL BLOODWORK (INR) LATER THIS WEEK. DO NOT RESUME THE LOVENOX (HEPARIN INJECTIONS)  ___________________________________________________________________________________________    YOU HAD AN ENDOSCOPIC PROCEDURE TODAY: Refer to the procedure report and other information in the discharge instructions given to you for any specific questions about what was found during the examination. If this information does not answer your questions, please call [place] office at [phone] to clarify.   YOU SHOULD EXPECT: Some feelings of bloating in the abdomen. Passage of more gas than usual. Walking can help get rid of the air that was put into your GI tract during the procedure and reduce the bloating. If you had a lower endoscopy (such as a colonoscopy or flexible sigmoidoscopy) you may notice spotting of blood in your stool or on the toilet paper. Some abdominal soreness may be present for a day or two, also.  DIET: Your first meal following the procedure should be a light meal and then it is ok to progress to your normal diet. A half-sandwich or bowl of soup is an example of a good first meal. Heavy or fried foods are harder to digest and may make you feel nauseous or bloated. Drink plenty of fluids but you should avoid alcoholic beverages for 24 hours. If you had a esophageal dilation, please see attached instructions for diet.    ACTIVITY: Your care partner should take you home directly after the procedure. You should plan to take it easy, moving slowly for the rest of the day. You can resume normal activity the day after the procedure however YOU SHOULD NOT DRIVE, use power tools, machinery or perform tasks that involve climbing or major physical exertion for 24 hours (because of the sedation medicines used during the test).   SYMPTOMS TO REPORT IMMEDIATELY: A gastroenterologist can be reached at any  hour. Please call [phone]  for any of the following symptoms:   Following lower endoscopy (colonoscopy, flexible sigmoidoscopy) Excessive amounts of blood in the stool  Significant tenderness, worsening of abdominal pains  Swelling of the abdomen that is new, acute  Fever of 100 or higher   FOLLOW UP:  If any biopsies were taken you will be contacted by phone or by letter within the next 1-3 weeks. Call [phone]  if you have not heard about the biopsies in 3 weeks.  Please also call with any specific questions about appointments or follow up tests.

## 2019-11-22 NOTE — Op Note (Addendum)
[facility] Patient Name: Jason [name] Procedure Date: 11/22/2019 MRN: [id number] Attending MD: Estill Cotta. Loletha Carrow , MD Date of Birth: 01/06/1946 CSN: [id number] Age: 74 Admit Type: Outpatient Procedure:                Colonoscopy Indications:              For therapy of rectal polyps [facility]                            adenomatous-appearing polyp 09/2018 not removed                            while patient on Brilinta); also chronic diarrhea -                            limited biopsies taken 2020 Providers:                Mallie Mussel L. Loletha Carrow, MD, Benetta Spar RN, RN, Laverda Sorenson, Technician, Karis Juba, CRNA Referring MD:              Medicines:                Monitored Anesthesia Care Complications:            No immediate complications. Estimated Blood Loss:     Estimated blood loss was minimal. Procedure:                Pre-Anesthesia Assessment:                           - Prior to the procedure, a History and Physical                            was performed, and patient medications and                            allergies were reviewed. The patient's tolerance of                            previous anesthesia was also reviewed. The risks                            and benefits of the procedure and the sedation                            options and risks were discussed with the patient.                            All questions were answered, and informed consent                            was obtained. Prior Anticoagulants: The patient  last took Coumadin (warfarin) 4 days and Lovenox                            (enoxaparin) 1 day prior to the procedure. ASA                            Grade Assessment: III - A patient with severe                            systemic disease. After reviewing the risks and                            benefits, the patient was deemed in satisfactory                            condition to  undergo the procedure.                           After obtaining informed consent, the colonoscope                            was passed under direct vision. Throughout the                            procedure, the patient's blood pressure, pulse, and                            oxygen saturations were monitored continuously. The                            PCF-H190DL ([id number]) Olympus pediatric colonscope                            was introduced through the anus and advanced to the                            the cecum, identified by appendiceal orifice and                            ileocecal valve. The colonoscopy was performed                            without difficulty. The patient tolerated the                            procedure fairly well. The quality of the bowel                            preparation was fair. The ileocecal valve,                            appendiceal orifice, and rectum were photographed. Scope In: 9:14:33 AM Scope Out: 9:38:58 AM Scope Withdrawal Time: 0 hours 23 minutes 11 seconds  Total  Procedure Duration: 0 hours 24 minutes 25 seconds  Findings:      The digital rectal exam findings include rectal stricture.      Multiple diverticula were found in the left colon and right colon.      There was evidence of a prior end-to-end colo-rectal anastomosis in the       distal rectum. This was patent and was characterized by healthy       appearing mucosa.      A 12 mm polyp was found in the mid ascending colon. The polyp was       sessile. The polyp was removed with a piecemeal technique using a hot       snare. Resection and retrieval were complete. To prevent bleeding       post-intervention, two hemostatic clips were successfully placed (MR       conditional).      Normal mucosa was found in the entire colon. Biopsies for histology were       taken with a cold forceps from the right colon (2) and left colon (1)       for evaluation of microscopic colitis.  The left colon mucosal defect       after Bx was larger than expected, but stopped bleeding quickly. Because       patient must resume OAC,to prevent bleeding post-intervention, two       hemostatic clips were successfully placed (MR conditional). There was no       bleeding at the end of the procedure.      The exam was otherwise without abnormality (given limitation of       preparation). Impression:               - Preparation of the colon was fair.                           - Rectal stricture found on digital rectal exam.                           - Diverticulosis in the left colon and in the right                            colon.                           - Patent end-to-end colo-rectal anastomosis,                            characterized by healthy appearing mucosa.                           - One 12 mm polyp in the mid ascending colon,                            removed piecemeal using a hot snare. Resected and                            retrieved. Clips (MR conditional) were placed.                           -  Normal mucosa in the entire examined colon.                            Biopsied. Clips (MR conditional) were placed.                           - The examination was otherwise normal. Moderate Sedation:      MAC sedation used Recommendation:           - Resume previous diet.                           - Resume Coumadin (warfarin) at prior dose today.                            Refer to Coumadin Clinic for further adjustment of                            therapy.                           - Await pathology results.                           - No repeat routine surveillance colonoscopy due to                            age and comorbidities.                           - DO NOT RESUME LOVENOX (bleeding risk in setting                            of documented patient and caregiver uncertainty                            with medicine regimen felt to outweight benefits of                             post-procedure lovenox bridge) Procedure Code(s):        --- Professional ---                           [phone], Colonoscopy, flexible; with removal of                            tumor(s), polyp(s), or other lesion(s) by snare                            technique                           [id number], 59, Colonoscopy, flexible; with biopsy,                            single or multiple Diagnosis Code(s):        ---  Professional ---                           K62.4, Stenosis of anus and rectum                           Z98.0, Intestinal bypass and anastomosis status                           K63.5, Polyp of colon                           K62.1, Rectal polyp                           K57.30, Diverticulosis of large intestine without                            perforation or abscess without bleeding CPT copyright 2019 [employer]. All rights reserved. The codes documented in this report are preliminary and upon coder review may  be revised to meet current compliance requirements. Denece Shearer L. Loletha Carrow, MD 11/22/2019 9:49:14 AM This report has been signed electronically. Number of Addenda: 0

## 2019-11-23 ENCOUNTER — Ambulatory Visit: Payer: Medicare HMO

## 2019-11-23 LAB — SURGICAL PATHOLOGY

## 2019-11-24 ENCOUNTER — Encounter: Payer: Self-pay | Admitting: Gastroenterology

## 2019-11-25 ENCOUNTER — Other Ambulatory Visit: Payer: Self-pay

## 2019-11-25 ENCOUNTER — Encounter: Payer: Self-pay | Admitting: Rehabilitative and Restorative Service Providers"

## 2019-11-25 ENCOUNTER — Ambulatory Visit: Payer: Medicare HMO | Admitting: Rehabilitative and Restorative Service Providers"

## 2019-11-25 DIAGNOSIS — M6281 Muscle weakness (generalized): Secondary | ICD-10-CM

## 2019-11-25 DIAGNOSIS — I69354 Hemiplegia and hemiparesis following cerebral infarction affecting left non-dominant side: Secondary | ICD-10-CM | POA: Diagnosis not present

## 2019-11-25 DIAGNOSIS — R2689 Other abnormalities of gait and mobility: Secondary | ICD-10-CM

## 2019-11-25 DIAGNOSIS — R2681 Unsteadiness on feet: Secondary | ICD-10-CM

## 2019-11-25 DIAGNOSIS — M21372 Foot drop, left foot: Secondary | ICD-10-CM | POA: Diagnosis not present

## 2019-11-25 NOTE — Therapy (Signed)
[facility] [address] [place], [place], [address] Phone: [phone]   Fax:  [phone]  Physical Therapy Treatment  Patient Details  Name: Jason KINGSTON Sr. MRN: [id number] Date of Birth: April 06, 1946 Referring Provider (PT): [name], MD   Encounter Date: 11/25/2019  PT End of Session - 11/25/19 1101    Visit Number  8    Number of Visits  17    Date for PT Re-Evaluation  12/16/19    Authorization Type  [employer] medicare    Progress Note Due on Visit  10    PT Start Time  1013    PT Stop Time  1054    PT Time Calculation (min)  41 min    Equipment Utilized During Treatment  Gait belt    Activity Tolerance  Patient tolerated treatment well    Behavior During Therapy  WFL for tasks assessed/performed       Past Medical History:  Diagnosis Date  . Anemia   . Arthritis   . Atrial fibrillation ([place])    on Coumadin   . Chronic renal insufficiency 07/31/2011  . COPD (chronic obstructive pulmonary disease) ([place])    pt denies  . CVA (cerebral vascular accident) ([place]) 06/2018  . Deficiency anemia 07/20/2012  . Diverticulosis   . Edema 09/21/2010   Qualifier: Diagnosis of  By: Jerold Coombe    . ERECTILE DYSFUNCTION, MILD 05/11/2007   Qualifier: Diagnosis of  By: Sherren Mocha MD, Jory Ee   . Exertional dyspnea 10/30/2015  . GOUT 05/11/2007   Qualifier: Diagnosis of  By: Sherren Mocha MD, Jory Ee   . Hereditary and idiopathic peripheral neuropathy 09/14/2014  . History of radiation therapy 02/21/13-03/31/13   rectum 50.4Gy total dose  . Hypersomnia 10/30/2015  . Hypertension   . Lung nodule 10/30/2015  . OAB (overactive bladder) 01/09/2016  . Obesity (BMI 30.0-34.9) 10/30/2015  . Obstructive sleep apnea 07/29/2010   HST 11/2015 AHI 54.  On autocpap  >> 10 cm   . Peripheral edema 09/19/2013  . rectal ca dx'd 02/01/13   rectal. Radiation and chemotherapy- remains on chemotherapy-next tx. 08-22-13.   . S/P partial lobectomy of lung  11/20/2015  . S/P thoracotomy   . Sleep apnea    wears cpap   . SUI (stress urinary incontinence), male 01/09/2016    Past Surgical History:  Procedure Laterality Date  . A/V FISTULAGRAM Left 06/14/2018   Procedure: A/V FISTULAGRAM;  Surgeon: Waynetta Sandy, MD;  Location: [facility];  Service: Cardiovascular;  Laterality: Left;  . AV FISTULA PLACEMENT Left 03/24/2016   Procedure: CREATION OF LEFT BRACIOCEPHALIC ARTERIOVENOUS (AV) FISTULA;  Surgeon: Elam Dutch, MD;  Location: [facility];  Service: Vascular;  Laterality: Left;  . BIOPSY  09/17/2018   Procedure: BIOPSY;  Surgeon: Doran Stabler, MD;  Location: [facility];  Service: Gastroenterology;;  . BIOPSY  03/09/2019   Procedure: BIOPSY;  Surgeon: Doran Stabler, MD;  Location: [facility];  Service: Gastroenterology;;  . BIOPSY  11/22/2019   Procedure: BIOPSY;  Surgeon: Doran Stabler, MD;  Location: [facility];  Service: Gastroenterology;;  . COLON SURGERY  05/20/2013  . COLONOSCOPY  2010   [facility]  . COLONOSCOPY WITH PROPOFOL N/A 09/17/2018   Procedure: COLONOSCOPY WITH PROPOFOL;  Surgeon: Doran Stabler, MD;  Location: [facility];  Service: Gastroenterology;  Laterality: N/A;  . COLONOSCOPY WITH PROPOFOL N/A 11/22/2019   Procedure: COLONOSCOPY WITH PROPOFOL;  Surgeon: Doran Stabler, MD;  Location: [facility];  Service: Gastroenterology;  Laterality: N/A;  Hx of colon polyps tt   . ESOPHAGOGASTRODUODENOSCOPY (EGD) WITH PROPOFOL N/A 09/17/2018   Procedure: ESOPHAGOGASTRODUODENOSCOPY (EGD) WITH PROPOFOL;  Surgeon: Doran Stabler, MD;  [facility];  Service: Gastroenterology;  Laterality: N/A;  . ESOPHAGOGASTRODUODENOSCOPY (EGD) WITH PROPOFOL N/A 03/09/2019   Procedure: ESOPHAGOGASTRODUODENOSCOPY (EGD) WITH PROPOFOL;  Surgeon: Doran Stabler, MD;  [facility];  Service: Gastroenterology;  Laterality: N/A;  . EUS N/A 02/03/2013   Procedure: LOWER ENDOSCOPIC  ULTRASOUND (EUS);  Surgeon: Milus Banister, MD;  Location: [facility];  Service: Endoscopy;  Laterality: N/A;  . HEMOSTASIS CLIP PLACEMENT  11/22/2019   Procedure: HEMOSTASIS CLIP PLACEMENT;  Surgeon: Doran Stabler, MD;  [facility];  Service: Gastroenterology;;  . Lucianne Lei CLOSURE N/A 08/24/2013   Procedure: CLOSURE OF LOOP ILEOSTOMY ;  Surgeon: Leighton Ruff, MD;  [facility]: [facility];  Service: General;  Laterality: N/A;  . IR ANGIO VERTEBRAL SEL SUBCLAVIAN INNOMINATE BILAT MOD SED  06/08/2018  . IR CT HEAD LTD  06/08/2018  . IR INTRAVSC STENT CERV CAROTID W/O EMB-PROT MOD SED INC ANGIO  06/08/2018  . IR PERCUTANEOUS ART THROMBECTOMY/INFUSION INTRACRANIAL INC DIAG ANGIO  06/08/2018  . LAPAROSCOPIC LOW ANTERIOR RESECTION N/A 05/20/2013   Procedure: LAPAROSCOPIC LOW ANTERIOR RESECTION WITH SPLENIC FLEXURE MOBILIZATION;  Surgeon: Leighton Ruff, MD;  Location[facility];  Service: General;  Laterality: N/A;  . OSTOMY N/A 05/20/2013   Procedure: diverting OSTOMY;  Surgeon: Leighton Ruff, MD;  Location: [facility];  Service: General;  Laterality: N/A;  . PERIPHERAL VASCULAR INTERVENTION Left 06/14/2018   Procedure: PERIPHERAL VASCULAR INTERVENTION;  Surgeon: Waynetta Sandy, MD;  Location: [facility];  Service: Cardiovascular;  Laterality: Left;  AVF on the Left  . POLYPECTOMY    . POLYPECTOMY  11/22/2019   Procedure: POLYPECTOMY;  Surgeon: Doran Stabler, MD;  Location: [facility];  Service: Gastroenterology;;  . RADIOLOGY WITH ANESTHESIA N/A 06/08/2018   Procedure: IR WITH ANESTHESIA;  Surgeon: Radiologist, Medication, MD;  Location: [facility];  Service: Radiology;  Laterality: N/A;  . UPPER GASTROINTESTINAL ENDOSCOPY    . VIDEO ASSISTED THORACOSCOPY (VATS)/WEDGE RESECTION Left 11/20/2015   Procedure: VIDEO ASSISTED THORACOSCOPY (VATS)/LUNG RESECTION;  Surgeon: [name], MD;  Location: [facility];  Service: Thoracic;  Laterality: Left;  Marland Kitchen VIDEO BRONCHOSCOPY N/A 11/20/2015    Procedure: VIDEO BRONCHOSCOPY;  Surgeon: [name], MD;  Location: [facility];  Service: Thoracic;  Laterality: N/A;    There were no vitals filed for this visit.  Subjective Assessment - 11/25/19 1011    Subjective  Patient got his brace one week ago and has been wearing it the entire time. No issues with brace, pressure points or discomfort. Had a colonoscopy eariler in the week.    Currently in Pain?  No/denies                        [facility] Adult PT Treatment/Exercise - 11/25/19 1020      Ambulation/Gait   Ambulation/Gait  Yes    Ambulation/Gait Assistance  5: Supervision;4: Min guard;4: Min assist    Ambulation/Gait Assistance Details  Ambulated indoors and outdoors (grass, solid surface inclines up/down) wiht S on solid surfaces including inclines, but min guard required for safety when on grass and very intermittent min A when dual tasking on grass due to occsaional misstep/poor L foot  clearance. On solid, level surfaces practiced gait with head turns/nods demo increased unsteadiness with vertical head turns - greater path deviation.     Ambulation Distance (Feet)  500 Feet   200 x 1, 500 x 1, multiple short gym distances   Assistive device  None    Gait Pattern  Decreased dorsiflexion - left;Decreased weight shift to left;Decreased stance time - left;Decreased step length - right;Decreased step length - left;Poor foot clearance - left    Ambulation Surface  Level;Unlevel;Indoor;Outdoor;Paved;Grass    Gait velocity  --    Stairs  Yes    Stairs Assistance  5: Supervision    Stairs Assistance Details (indicate cue type and reason)  cueing for technique with L AFO for improved foot cleraance    Stair Management Technique  Alternating pattern;Forwards;No rails;One rail Left    Number of Stairs  8    Height of Stairs  6    Gait Comments  Completed gait with patient's new PLS AFO (received it on 11/18/19) with all gait training outlined, above.      Neuro Re-ed     Neuro Re-ed Details   alt foot taps on bottom step of stair + BUE support with S x 10 bilaterally; progressed to no UE support + min guard with intermittent min A when in single limb stance on LLE x 10 bilaterally; at counter fwd/back and left side stepping x 12 laps; L hand support only on counter + R foot lat and posterior taps with cueing required to maintain upright posture and adequate L weight shift to fully move RLE appropriately; partial tandem stance with L foot posterior + 30 second hold, 2 reps requires close S with intermittent tactile cueing to maintain balance on second set; STS with L foot posterior x 5, 2 sets with cueing to use L hand to push up/reach for edge of mat with stand up/sit down respectively        Exercises   Exercises  --    Other Exercises   --               PT Short Term Goals - 11/11/19 1023      PT SHORT TERM GOAL #1   Title  The patient will be indep with HEP for LE strengthening, balance and general mobility. (Target Date: 11/16/19)    Time  4    Period  Weeks    Status  Achieved    Target Date  11/16/19      PT SHORT TERM GOAL #2   Title  Pt will improve FGA to >/=20/30 in order to indicate decreased fall risk.    Baseline  22/30 on 11/11/19    Time  4    Period  Weeks    Status  Achieved      PT SHORT TERM GOAL #3   Title  Pt will improve gait speed to >/=3.40 ft/sec w/ AFO in order to indicate more normal gait speed.    Baseline  3.45 ft/sec with AFO    Time  4    Period  Weeks    Status  Achieved      PT SHORT TERM GOAL #4   Title  Pt will perform 5TSS </=11.5 secs without UE support in order to indicate decreased fall risk.    Baseline  12.45 on 11/11/19 without UE support    Time  4    Period  Weeks    Status  Partially Met  PT Long Term Goals - 10/17/19 1545      PT LONG TERM GOAL #1   Title  The patient will be indep with progression of HEP for post d/c exercise. (Target Date: 12/16/19)    Time  8    Period  Weeks     Status  New    Target Date  12/16/19      PT LONG TERM GOAL #2   Title  Pt will improve FGA to >/=23/30 in order to decrease fall risk.    Time  8    Period  Weeks    Status  New      PT LONG TERM GOAL #3   Title  Pt will ambulate x 1000' over varying outdoor surfaces without device with AFO at mod I level in order to return to leisure activiites.    Time  8    Period  Weeks    Status  New      PT LONG TERM GOAL #4   Title  Pt will simulate casting fishing line without LOB in order to return to leisure activity.    Time  8    Status  New            Plan - 11/25/19 1101    Clinical Impression Statement  Today's skilled session focused on progression of higher level banace and gait training (including stairs) with patient's new L AFO (received it on 11/18/19). PT assessed patient's skin, and no issues noted with him reporting he has been wearing brace dialy since receiving it. Tolerated session well today, but demo fatigue by end of session especially with NMR biased to LLE weight bearing/work. Demo great progress and gait mechanics much improved with this new AFO. Will benefit from continued skilled PT working towards [place].    Personal Factors and Comorbidities  Age;Comorbidity 3+;Time since onset of injury/illness/exacerbation    Examination-Activity Limitations  Lift;Stand;Stairs;Squat;Locomotion Level    Examination-Participation Restrictions  Community Activity;Yard Work;Other   fishing   Stability/Clinical Decision Making  Stable/Uncomplicated    Rehab Potential  Good    PT Frequency  2x / week    PT Duration  8 weeks    PT Treatment/Interventions  ADLs/Self Care Home Management;Aquatic Therapy;DME Instruction;Gait training;Stair training;Functional mobility training;Therapeutic activities;Therapeutic exercise;Balance training;Neuromuscular re-education;Patient/family education;Orthotic Fit/Training;Passive range of motion;Vestibular    PT Next Visit Plan  Continued balance,  BLE strengthening targeting hip musculature, gait training/stairs with new AFO, dual tasking/head turns/distraction during gait    Consulted and Agree with Plan of Care  Patient       Patient will benefit from skilled therapeutic intervention in order to improve the following deficits and impairments:  Abnormal gait, Decreased balance, Decreased activity tolerance, Decreased endurance, Decreased knowledge of use of DME, Decreased mobility, Decreased range of motion, Decreased strength, Impaired flexibility, Postural dysfunction  Visit Diagnosis: Left foot drop  Muscle weakness (generalized)  Unsteadiness on feet  Other abnormalities of gait and mobility  Hemiplegia and hemiparesis following cerebral infarction affecting left non-dominant side [facility])     Problem List Patient Active Problem List   Diagnosis Date Noted  . Meralgia paraesthetica, right 08/12/2019  . 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 Gait disturbance, post-stroke 04/17/2019  . Long term (current) use of anticoagulants 12/31/2018  . Atrial fibrillation, unspecified type ([place]) 12/22/2018  . Longstanding persistent atrial fibrillation ([place]) 09/20/2018  . Functional diarrhea 07/14/2018  . Benign essential HTN   . Hyperglycemia   .  ESRD on dialysis ([place]) 06/16/2018  . Mild malnutrition ([place]) 06/16/2018  . Stroke due to embolism ([place]) 06/16/2018  . CVA (cerebral vascular accident) ([place]) 06/08/2018  . CHF (congestive heart failure) ([place]) 05/08/2018  . Hyperlipidemia LDL goal <70 09/23/2017  . OAB (overactive bladder) 01/09/2016  . SUI (stress urinary incontinence), male 01/09/2016  . Hypersomnia 10/30/2015  . Obesity (BMI 30.0-34.9) 10/30/2015  . Routine general medical examination at a health care facility 06/04/2015  . Medicare annual wellness visit, subsequent 06/04/2015  . Hereditary and idiopathic peripheral neuropathy 09/14/2014  . Rectal cancer ([place]) 02/01/2013  . Obstructive  sleep apnea 07/29/2010  . GOUT 05/11/2007  . ERECTILE DYSFUNCTION, MILD 05/11/2007    [place], PT, DPT  11/25/2019, 11:04 AM  [facility] [address] [place], [place], [address] Phone: [phone]   Fax:  [phone]  Name: Jason CRICKENBERGER Sr. MRN: [id number] Date of Birth: [birth year]

## 2019-11-28 ENCOUNTER — Ambulatory Visit (INDEPENDENT_AMBULATORY_CARE_PROVIDER_SITE_OTHER): Payer: Medicare HMO | Admitting: General Practice

## 2019-11-28 ENCOUNTER — Other Ambulatory Visit: Payer: Self-pay

## 2019-11-28 DIAGNOSIS — Z7901 Long term (current) use of anticoagulants: Secondary | ICD-10-CM | POA: Diagnosis not present

## 2019-11-28 DIAGNOSIS — I4891 Unspecified atrial fibrillation: Secondary | ICD-10-CM

## 2019-11-28 LAB — POCT INR: INR: 3 (ref 2.0–3.0)

## 2019-11-28 NOTE — Patient Instructions (Signed)
Pre visit review using our clinic review tool, if applicable. No additional management support is needed unless otherwise documented below in the visit note.  Continue to take 1 tablet daily except 1/2 tablet on Mon Wed and Fridays.   Re-check in 4 weeks.

## 2019-11-30 ENCOUNTER — Other Ambulatory Visit: Payer: Self-pay

## 2019-11-30 ENCOUNTER — Ambulatory Visit: Payer: Medicare HMO

## 2019-11-30 ENCOUNTER — Telehealth: Payer: Self-pay | Admitting: Gastroenterology

## 2019-11-30 DIAGNOSIS — R2681 Unsteadiness on feet: Secondary | ICD-10-CM | POA: Diagnosis not present

## 2019-11-30 DIAGNOSIS — M6281 Muscle weakness (generalized): Secondary | ICD-10-CM | POA: Diagnosis not present

## 2019-11-30 DIAGNOSIS — R2689 Other abnormalities of gait and mobility: Secondary | ICD-10-CM | POA: Diagnosis not present

## 2019-11-30 DIAGNOSIS — M21372 Foot drop, left foot: Secondary | ICD-10-CM

## 2019-11-30 DIAGNOSIS — I69354 Hemiplegia and hemiparesis following cerebral infarction affecting left non-dominant side: Secondary | ICD-10-CM | POA: Diagnosis not present

## 2019-11-30 MED ORDER — PANCRELIPASE (LIP-PROT-AMYL) 36000-114000 UNITS PO CPEP: ORAL_CAPSULE | ORAL | 6 refills | Status: DC

## 2019-11-30 MED ORDER — PANCRELIPASE (LIP-PROT-AMYL) 36000-114000 UNITS PO CPEP: 36000.0000 [IU] | ORAL_CAPSULE | Freq: Three times a day (TID) | ORAL | 6 refills | Status: DC

## 2019-11-30 NOTE — Telephone Encounter (Signed)
Pt inquired what he can take for diarrhea.  He stated that he has been taking two bottles of imodium for symptoms to be relieved.

## 2019-11-30 NOTE — Patient Instructions (Signed)
Access Code: [facility] URL: [email or web address] Date: 11/30/2019 Prepared by: Baldomero Lamy  Exercises Standing Hip Abduction with Counter Support - 1 x daily - 5 x weekly - 1 sets - 10 reps Seated Gastroc Stretch with Strap - 2 x daily - 7 x weekly - 1 sets - 2 reps - 30 seconds hold Sit to Stand without Arm Support - 1 x daily - 7 x weekly - 1 sets - 10 reps Walking March - 1 x daily - 7 x weekly - 1 sets - 4 reps Tandem Walking with Counter Support - 1 x daily - 7 x weekly - 1 sets - 4 reps Seated Gaze Stabilization with Head Rotation - 1 x daily - 7 x weekly - 2 sets - 10 reps

## 2019-11-30 NOTE — Telephone Encounter (Signed)
I completed the our portion of the assistance program paperwork back in Dec. 2020. I guess they were approved. I never received any further details after that.

## 2019-11-30 NOTE — Therapy (Signed)
[facility] [address] [place], [place], [address] Phone: [phone]   Fax:  [phone]  Physical Therapy Treatment  Patient Details  Name: Jason WIDENER Sr. MRN: [id number] Date of Birth: [birth year] Referring Provider (PT): [name], MD   Encounter Date: 11/30/2019  PT End of Session - 11/30/19 0936    Visit Number  9    Number of Visits  17    Date for PT Re-Evaluation  12/16/19    Authorization Type  [employer] medicare    Progress Note Due on Visit  10    PT Start Time  0930    PT Stop Time  1014    PT Time Calculation (min)  44 min    Equipment Utilized During Treatment  Gait belt    Activity Tolerance  Patient tolerated treatment well    Behavior During Therapy  WFL for tasks assessed/performed       Past Medical History:  Diagnosis Date  . Anemia   . Arthritis   . Atrial fibrillation ([place])    on Coumadin   . Chronic renal insufficiency 07/31/2011  . COPD (chronic obstructive pulmonary disease) ([place])    pt denies  . CVA (cerebral vascular accident) ([place]) 06/2018  . Deficiency anemia 07/20/2012  . Diverticulosis   . Edema 09/21/2010   Qualifier: Diagnosis of  By: Jerold Coombe    . ERECTILE DYSFUNCTION, MILD 05/11/2007   Qualifier: Diagnosis of  By: Sherren Mocha MD, Jory Ee   . Exertional dyspnea 10/30/2015  . GOUT 05/11/2007   Qualifier: Diagnosis of  By: Sherren Mocha MD, Jory Ee   . Hereditary and idiopathic peripheral neuropathy 09/14/2014  . History of radiation therapy 02/21/13-03/31/13   rectum 50.4Gy total dose  . Hypersomnia 10/30/2015  . Hypertension   . Lung nodule 10/30/2015  . OAB (overactive bladder) 01/09/2016  . Obesity (BMI 30.0-34.9) 10/30/2015  . Obstructive sleep apnea 07/29/2010   HST 11/2015 AHI 54.  On autocpap  >> 10 cm   . Peripheral edema 09/19/2013  . rectal ca dx'd 02/01/13   rectal. Radiation and chemotherapy- remains on chemotherapy-next tx. 08-22-13.   . S/P partial lobectomy of lung  11/20/2015  . S/P thoracotomy   . Sleep apnea    wears cpap   . SUI (stress urinary incontinence), male 01/09/2016    Past Surgical History:  Procedure Laterality Date  . A/V FISTULAGRAM Left 06/14/2018   Procedure: A/V FISTULAGRAM;  Surgeon: Waynetta Sandy, MD;  Location: [facility];  Service: Cardiovascular;  Laterality: Left;  . AV FISTULA PLACEMENT Left 03/24/2016   Procedure: CREATION OF LEFT BRACIOCEPHALIC ARTERIOVENOUS (AV) FISTULA;  Surgeon: Elam Dutch, MD;  Location: [facility];  Service: Vascular;  Laterality: Left;  . BIOPSY  09/17/2018   Procedure: BIOPSY;  Surgeon: Doran Stabler, MD;  Location: [facility];  Service: Gastroenterology;;  . BIOPSY  03/09/2019   Procedure: BIOPSY;  Surgeon: Doran Stabler, MD;  Location: [facility];  Service: Gastroenterology;;  . BIOPSY  11/22/2019   Procedure: BIOPSY;  Surgeon: Doran Stabler, MD;  Location: [facility];  Service: Gastroenterology;;  . COLON SURGERY  05/20/2013  . COLONOSCOPY  2010   [facility]  . COLONOSCOPY WITH PROPOFOL N/A 09/17/2018   Procedure: COLONOSCOPY WITH PROPOFOL;  Surgeon: Doran Stabler, MD;  Location: [facility];  Service: Gastroenterology;  Laterality: N/A;  . COLONOSCOPY WITH PROPOFOL N/A 11/22/2019   Procedure: COLONOSCOPY WITH PROPOFOL;  Surgeon: Doran Stabler, MD;  Location: [facility];  Service: Gastroenterology;  Laterality: N/A;  Hx of colon polyps tt   . ESOPHAGOGASTRODUODENOSCOPY (EGD) WITH PROPOFOL N/A 09/17/2018   Procedure: ESOPHAGOGASTRODUODENOSCOPY (EGD) WITH PROPOFOL;  Surgeon: Doran Stabler, MD;  Location: [facility];  Service: Gastroenterology;  Laterality: N/A;  . ESOPHAGOGASTRODUODENOSCOPY (EGD) WITH PROPOFOL N/A 03/09/2019   Procedure: ESOPHAGOGASTRODUODENOSCOPY (EGD) WITH PROPOFOL;  Surgeon: Doran Stabler, MD;  [facility]: [facility];  Service: Gastroenterology;  Laterality: N/A;  . EUS N/A 02/03/2013   Procedure: LOWER ENDOSCOPIC  ULTRASOUND (EUS);  Surgeon: Milus Banister, MD;  Location: [facility];  Service: Endoscopy;  Laterality: N/A;  . HEMOSTASIS CLIP PLACEMENT  11/22/2019   Procedure: HEMOSTASIS CLIP PLACEMENT;  Surgeon: Doran Stabler, MD;  [facility]: [facility];  Service: Gastroenterology;;  . Lucianne Lei CLOSURE N/A 08/24/2013   Procedure: CLOSURE OF LOOP ILEOSTOMY ;  Surgeon: Leighton Ruff, MD;  Location: [facility];  Service: General;  Laterality: N/A;  . IR ANGIO VERTEBRAL SEL SUBCLAVIAN INNOMINATE BILAT MOD SED  06/08/2018  . IR CT HEAD LTD  06/08/2018  . IR INTRAVSC STENT CERV CAROTID W/O EMB-PROT MOD SED INC ANGIO  06/08/2018  . IR PERCUTANEOUS ART THROMBECTOMY/INFUSION INTRACRANIAL INC DIAG ANGIO  06/08/2018  . LAPAROSCOPIC LOW ANTERIOR RESECTION N/A 05/20/2013   Procedure: LAPAROSCOPIC LOW ANTERIOR RESECTION WITH SPLENIC FLEXURE MOBILIZATION;  Surgeon: Leighton Ruff, MD;  Location: [facility];  Service: General;  Laterality: N/A;  . OSTOMY N/A 05/20/2013   Procedure: diverting OSTOMY;  Surgeon: Leighton Ruff, MD;  Location: [facility];  Service: General;  Laterality: N/A;  . PERIPHERAL VASCULAR INTERVENTION Left 06/14/2018   Procedure: PERIPHERAL VASCULAR INTERVENTION;  Surgeon: Waynetta Sandy, MD;  Location: [facility];  Service: Cardiovascular;  Laterality: Left;  AVF on the Left  . POLYPECTOMY    . POLYPECTOMY  11/22/2019   Procedure: POLYPECTOMY;  Surgeon: Doran Stabler, MD;  Location: [facility];  Service: Gastroenterology;;  . RADIOLOGY WITH ANESTHESIA N/A 06/08/2018   Procedure: IR WITH ANESTHESIA;  Surgeon: Radiologist, Medication, MD;  Location: [facility];  Service: Radiology;  Laterality: N/A;  . UPPER GASTROINTESTINAL ENDOSCOPY    . VIDEO ASSISTED THORACOSCOPY (VATS)/WEDGE RESECTION Left 11/20/2015   Procedure: VIDEO ASSISTED THORACOSCOPY (VATS)/LUNG RESECTION;  Surgeon: [name], MD;  Location: [facility];  Service: Thoracic;  Laterality: Left;  Marland Kitchen VIDEO BRONCHOSCOPY N/A 11/20/2015    Procedure: VIDEO BRONCHOSCOPY;  Surgeon: [name], MD;  Location: [facility];  Service: Thoracic;  Laterality: N/A;    There were no vitals filed for this visit.  Subjective Assessment - 11/30/19 0935    Subjective  Still repots no issues with brace at this time. Reports no falls. No pain today.    Pertinent History  HTN, dialysis (T, TH, S), rectal cancer, CHF, CVA on 07/05/18 ([facility])    Limitations  Walking;Standing;House hold activities    How long can you stand comfortably?  5 mins    How long can you walk comfortably?  5 mins    Patient Stated Goals  "Go back fishing"    Currently in Pain?  No/denies                        [facility] Adult PT Treatment/Exercise - 11/30/19 0943      Ambulation/Gait   Ambulation/Gait  Yes    Ambulation/Gait Assistance  5: Supervision;4: Min guard    Ambulation/Gait  Assistance Details  continued gait training around therapy gym with pt personal L AFO donned. Supv, no instances of toe catching with ambulation. Increased fatigue noticed.     Ambulation Distance (Feet)  475 Feet    Assistive device  None   L AFO   Gait Pattern  Decreased dorsiflexion - left;Decreased weight shift to left;Decreased stance time - left;Decreased step length - right;Decreased step length - left;Poor foot clearance - left    Ambulation Surface  Level;Indoor    Stairs  Yes    Stairs Assistance  5: Supervision    Stairs Assistance Details (indicate cue type and reason)  supv for safety, overall pt demo safety with ascend/descending stairs with AFO.     Stair Management Technique  Alternating pattern;Forwards;One rail Right    Number of Stairs  12    Height of Stairs  6      High Level Balance   High Level Balance Activities  Tandem walking;Head turns    High Level Balance Comments  completed tandem walking x 4 laps along countertop, with intermittent UE support as needed, CGA due to unsteadiness. Around therapy gym x 2 laps, completed head turns  (horizontal and vertical), patient demo increased difficulty with horizontal head turns and minor reports of dizziness.       Exercises   Exercises  Knee/Hip;Other Exercises    Other Exercises   Completed seated VOR x 1 x 30 seconds with increased dizziness (reports 4/10). Completed 1 x 30 after rest. Educated to complete at home, see patient instructions for details.       Knee/Hip Exercises: Standing   Hip Abduction  Stengthening;Both;2 sets;10 reps;Knee straight    Abduction Limitations  verbal cues to keep knee straight with completion    Hip Extension  Stengthening;Both;2 sets;10 reps;Knee straight    Extension Limitations  verbal cues to keep knee straight with completion    Lateral Step Up  Left;10 reps;1 set;Step Height: 6"    Lateral Step Up Limitations  w/ 6" step. verbal cues for foot placement. intermittent UE support from countertop.     Forward Step Up  Left;1 set;10 reps;Step Height: 6"    Forward Step Up Limitations  w/ 6" step. verbal cues for foot placement. intermittent UE support from countertop. Increased difficulty without UE support noted.              PT Education - 11/30/19 1011    Education Details  Educated on Update to HEP (see patient instructions; new addition highlighted)    Person(s) Educated  Patient    Methods  Explanation;Demonstration;Handout    Comprehension  Verbalized understanding;Returned demonstration;Verbal cues required       PT Short Term Goals - 11/11/19 1023      PT SHORT TERM GOAL #1   Title  The patient will be indep with HEP for LE strengthening, balance and general mobility. (Target Date: 11/16/19)    Time  4    Period  Weeks    Status  Achieved    Target Date  11/16/19      PT SHORT TERM GOAL #2   Title  Pt will improve FGA to >/=20/30 in order to indicate decreased fall risk.    Baseline  22/30 on 11/11/19    Time  4    Period  Weeks    Status  Achieved      PT SHORT TERM GOAL #3   Title  Pt will improve gait speed to  >/=3.40 ft/sec  w/ AFO in order to indicate more normal gait speed.    Baseline  3.45 ft/sec with AFO    Time  4    Period  Weeks    Status  Achieved      PT SHORT TERM GOAL #4   Title  Pt will perform 5TSS </=11.5 secs without UE support in order to indicate decreased fall risk.    Baseline  12.45 on 11/11/19 without UE support    Time  4    Period  Weeks    Status  Partially Met        PT Long Term Goals - 10/17/19 1545      PT LONG TERM GOAL #1   Title  The patient will be indep with progression of HEP for post d/c exercise. (Target Date: 12/16/19)    Time  8    Period  Weeks    Status  New    Target Date  12/16/19      PT LONG TERM GOAL #2   Title  Pt will improve FGA to >/=23/30 in order to decrease fall risk.    Time  8    Period  Weeks    Status  New      PT LONG TERM GOAL #3   Title  Pt will ambulate x 1000' over varying outdoor surfaces without device with AFO at mod I level in order to return to leisure activiites.    Time  8    Period  Weeks    Status  New      PT LONG TERM GOAL #4   Title  Pt will simulate casting fishing line without LOB in order to return to leisure activity.    Time  8    Status  New            Plan - 11/30/19 1130    Clinical Impression Statement  Today's skilled PT session focused on continuing gait training and stair training with patients personal L AFO. Also continued high level balance activites and strengthening targeting the LLE>RLE. Patient demo increased unsteadiness with high level balance activities. With head turns during gait, patient reporting increased dizziness. completed VOR x 1 with increasing syptoms, educated on proper completion of this exercise at home. Pt will continue to benefit from skilled PT services to progress towards LTGs.    Personal Factors and Comorbidities  Age;Comorbidity 3+;Time since onset of injury/illness/exacerbation    Examination-Activity Limitations  Lift;Stand;Stairs;Squat;Locomotion Level     Examination-Participation Restrictions  Community Activity;Yard Work;Other   fishing   Stability/Clinical Decision Making  Stable/Uncomplicated    Rehab Potential  Good    PT Frequency  2x / week    PT Duration  8 weeks    PT Treatment/Interventions  ADLs/Self Care Home Management;Aquatic Therapy;DME Instruction;Gait training;Stair training;Functional mobility training;Therapeutic activities;Therapeutic exercise;Balance training;Neuromuscular re-education;Patient/family education;Orthotic Fit/Training;Passive range of motion;Vestibular    PT Next Visit Plan  How was HEP addition? Continue gait trianing, BLE targeting hip strengthing, high level balance activities, dual tasking. Progress Note needed due to 10th visit.    Consulted and Agree with Plan of Care  Patient       Patient will benefit from skilled therapeutic intervention in order to improve the following deficits and impairments:  Abnormal gait, Decreased balance, Decreased activity tolerance, Decreased endurance, Decreased knowledge of use of DME, Decreased mobility, Decreased range of motion, Decreased strength, Impaired flexibility, Postural dysfunction  Visit Diagnosis: Left foot drop  Muscle weakness (generalized)  Unsteadiness on feet  Other abnormalities of gait and mobility     Problem List Patient Active Problem List   Diagnosis Date Noted  . Meralgia paraesthetica, right 08/12/2019  . 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 Gait disturbance, post-stroke 04/17/2019  . Long term (current) use of anticoagulants 12/31/2018  . Atrial fibrillation, unspecified type ([place]) 12/22/2018  . Longstanding persistent atrial fibrillation ([place]) 09/20/2018  . Functional diarrhea 07/14/2018  . Benign essential HTN   . Hyperglycemia   . ESRD on dialysis ([place]) 06/16/2018  . Mild malnutrition ([place]) 06/16/2018  . Stroke due to embolism ([place]) 06/16/2018  . CVA (cerebral vascular accident) ([place]) 06/08/2018   . CHF (congestive heart failure) ([place]) 05/08/2018  . Hyperlipidemia LDL goal <70 09/23/2017  . OAB (overactive bladder) 01/09/2016  . SUI (stress urinary incontinence), male 01/09/2016  . Hypersomnia 10/30/2015  . Obesity (BMI 30.0-34.9) 10/30/2015  . Routine general medical examination at a health care facility 06/04/2015  . Medicare annual wellness visit, subsequent 06/04/2015  . Hereditary and idiopathic peripheral neuropathy 09/14/2014  . Rectal cancer ([place]) 02/01/2013  . Obstructive sleep apnea 07/29/2010  . GOUT 05/11/2007  . ERECTILE DYSFUNCTION, MILD 05/11/2007    Jones Bales, PT, DPT 11/30/2019, 11:38 AM  [facility] [address] [place], [place], [address] Phone: [phone]   Fax:  [phone]  Name: CAMEREN EARNEST Sr. MRN: [id number] Date of Birth: 08-10-1945

## 2019-11-30 NOTE — Telephone Encounter (Signed)
Wife states pt got letter in the mail from colon results from Dr. Loletha Carrow is the letter stated for him to continue taking Creon. At first wife stated they did not have creon. Saw creon on medlist and was going to send refill but wife called back and stated that he did have the Creon. Reports he has been getting it from [facility] and he is supposed to take creon 36000 units 2 caps with each meal and 1 with a snack. Was going to send this script in but do not see [facility] as pharmacy listed. Do you know anything about this for pt?

## 2019-12-01 MED ORDER — PANCRELIPASE (LIP-PROT-AMYL) 36000-114000 UNITS PO CPEP
ORAL_CAPSULE | ORAL | 11 refills | Status: DC
Start: 2019-12-01 — End: 2020-09-16

## 2019-12-01 NOTE — Telephone Encounter (Signed)
Per Magda Paganini it needs to go to [facility], Sorry I'm not much help.

## 2019-12-01 NOTE — Telephone Encounter (Signed)
Script sent to [facility].

## 2019-12-01 NOTE — Telephone Encounter (Signed)
So where do I need to send the script to?

## 2019-12-02 ENCOUNTER — Ambulatory Visit: Payer: Medicare HMO | Admitting: Rehabilitation

## 2019-12-02 ENCOUNTER — Encounter: Payer: Self-pay | Admitting: Rehabilitation

## 2019-12-02 ENCOUNTER — Other Ambulatory Visit: Payer: Self-pay

## 2019-12-02 DIAGNOSIS — M21372 Foot drop, left foot: Secondary | ICD-10-CM

## 2019-12-02 DIAGNOSIS — I69354 Hemiplegia and hemiparesis following cerebral infarction affecting left non-dominant side: Secondary | ICD-10-CM | POA: Diagnosis not present

## 2019-12-02 DIAGNOSIS — M6281 Muscle weakness (generalized): Secondary | ICD-10-CM

## 2019-12-02 DIAGNOSIS — R2689 Other abnormalities of gait and mobility: Secondary | ICD-10-CM

## 2019-12-02 DIAGNOSIS — R2681 Unsteadiness on feet: Secondary | ICD-10-CM | POA: Diagnosis not present

## 2019-12-02 NOTE — Therapy (Signed)
[facility] [address] [place], [place], [address] Phone: [phone]   Fax:  [phone]  Physical Therapy Treatment and Progress Note  Patient Details  Name: Jason [name] Sr. MRN: [id number] Date of Birth: [birth year] Referring Provider (PT): [name], MD   Encounter Date: 12/02/2019  PT End of Session - 12/02/19 1008    Visit Number  10    Number of Visits  17    Date for PT Re-Evaluation  12/16/19    Authorization Type  [employer] medicare    Progress Note Due on Visit  10    PT Start Time  1005    PT Stop Time  1048    PT Time Calculation (min)  43 min    Equipment Utilized During Treatment  Gait belt    Activity Tolerance  Patient tolerated treatment well    Behavior During Therapy  WFL for tasks assessed/performed       Past Medical History:  Diagnosis Date  . Anemia   . Arthritis   . Atrial fibrillation ([place])    on Coumadin   . Chronic renal insufficiency 07/31/2011  . COPD (chronic obstructive pulmonary disease) ([place])    pt denies  . CVA (cerebral vascular accident) ([place]) 06/2018  . Deficiency anemia 07/20/2012  . Diverticulosis   . Edema 09/21/2010   Qualifier: Diagnosis of  By: Jerold Coombe    . ERECTILE DYSFUNCTION, MILD 05/11/2007   Qualifier: Diagnosis of  By: Sherren Mocha MD, Jory Ee   . Exertional dyspnea 10/30/2015  . GOUT 05/11/2007   Qualifier: Diagnosis of  By: Sherren Mocha MD, Jory Ee   . Hereditary and idiopathic peripheral neuropathy 09/14/2014  . History of radiation therapy 02/21/13-03/31/13   rectum 50.4Gy total dose  . Hypersomnia 10/30/2015  . Hypertension   . Lung nodule 10/30/2015  . OAB (overactive bladder) 01/09/2016  . Obesity (BMI 30.0-34.9) 10/30/2015  . Obstructive sleep apnea 07/29/2010   HST 11/2015 AHI 54.  On autocpap  >> 10 cm   . Peripheral edema 09/19/2013  . rectal ca dx'd 02/01/13   rectal. Radiation and chemotherapy- remains on chemotherapy-next tx. 08-22-13.   . S/P partial  lobectomy of lung 11/20/2015  . S/P thoracotomy   . Sleep apnea    wears cpap   . SUI (stress urinary incontinence), male 01/09/2016    Past Surgical History:  Procedure Laterality Date  . A/V FISTULAGRAM Left 06/14/2018   Procedure: A/V FISTULAGRAM;  Surgeon: Waynetta Sandy, MD;  Location: [facility];  Service: Cardiovascular;  Laterality: Left;  . AV FISTULA PLACEMENT Left 03/24/2016   Procedure: CREATION OF LEFT BRACIOCEPHALIC ARTERIOVENOUS (AV) FISTULA;  Surgeon: Elam Dutch, MD;  Location: [facility];  Service: Vascular;  Laterality: Left;  . BIOPSY  09/17/2018   Procedure: BIOPSY;  Surgeon: Doran Stabler, MD;  Location: [facility];  Service: Gastroenterology;;  . BIOPSY  03/09/2019   Procedure: BIOPSY;  Surgeon: Doran Stabler, MD;  Location: [facility];  Service: Gastroenterology;;  . BIOPSY  11/22/2019   Procedure: BIOPSY;  Surgeon: Doran Stabler, MD;  Location: [facility];  Service: Gastroenterology;;  . COLON SURGERY  05/20/2013  . COLONOSCOPY  2010   [facility]  . COLONOSCOPY WITH PROPOFOL N/A 09/17/2018   Procedure: COLONOSCOPY WITH PROPOFOL;  Surgeon: Doran Stabler, MD;  Location: [facility];  Service: Gastroenterology;  Laterality: N/A;  . COLONOSCOPY WITH PROPOFOL N/A 11/22/2019   Procedure:  COLONOSCOPY WITH PROPOFOL;  Surgeon: Doran Stabler, MD;  Location: [facility];  Service: Gastroenterology;  Laterality: N/A;  Hx of colon polyps tt   . ESOPHAGOGASTRODUODENOSCOPY (EGD) WITH PROPOFOL N/A 09/17/2018   Procedure: ESOPHAGOGASTRODUODENOSCOPY (EGD) WITH PROPOFOL;  Surgeon: Doran Stabler, MD;  Location: [facility];  Service: Gastroenterology;  Laterality: N/A;  . ESOPHAGOGASTRODUODENOSCOPY (EGD) WITH PROPOFOL N/A 03/09/2019   Procedure: ESOPHAGOGASTRODUODENOSCOPY (EGD) WITH PROPOFOL;  Surgeon: Doran Stabler, MD;  Location: [facility];  Service: Gastroenterology;  Laterality: N/A;  . EUS N/A 02/03/2013   Procedure: LOWER  ENDOSCOPIC ULTRASOUND (EUS);  Surgeon: Milus Banister, MD;  Location: [facility];  Service: Endoscopy;  Laterality: N/A;  . HEMOSTASIS CLIP PLACEMENT  11/22/2019   Procedure: HEMOSTASIS CLIP PLACEMENT;  Surgeon: Doran Stabler, MD;  Location: [facility];  Service: Gastroenterology;;  . Lucianne Lei CLOSURE N/A 08/24/2013   Procedure: CLOSURE OF LOOP ILEOSTOMY ;  Surgeon: Leighton Ruff, MD;  Location: [facility];  Service: General;  Laterality: N/A;  . IR ANGIO VERTEBRAL SEL SUBCLAVIAN INNOMINATE BILAT MOD SED  06/08/2018  . IR CT HEAD LTD  06/08/2018  . IR INTRAVSC STENT CERV CAROTID W/O EMB-PROT MOD SED INC ANGIO  06/08/2018  . IR PERCUTANEOUS ART THROMBECTOMY/INFUSION INTRACRANIAL INC DIAG ANGIO  06/08/2018  . LAPAROSCOPIC LOW ANTERIOR RESECTION N/A 05/20/2013   Procedure: LAPAROSCOPIC LOW ANTERIOR RESECTION WITH SPLENIC FLEXURE MOBILIZATION;  Surgeon: Leighton Ruff, MD;  Location: [facility];  Service: General;  Laterality: N/A;  . OSTOMY N/A 05/20/2013   Procedure: diverting OSTOMY;  Surgeon: Leighton Ruff, MD;  Location: [facility];  Service: General;  Laterality: N/A;  . PERIPHERAL VASCULAR INTERVENTION Left 06/14/2018   Procedure: PERIPHERAL VASCULAR INTERVENTION;  Surgeon: Waynetta Sandy, MD;  Location: [facility];  Service: Cardiovascular;  Laterality: Left;  AVF on the Left  . POLYPECTOMY    . POLYPECTOMY  11/22/2019   Procedure: POLYPECTOMY;  Surgeon: Doran Stabler, MD;  Location: [facility];  Service: Gastroenterology;;  . RADIOLOGY WITH ANESTHESIA N/A 06/08/2018   Procedure: IR WITH ANESTHESIA;  Surgeon: Radiologist, Medication, MD;  Location: [facility];  Service: Radiology;  Laterality: N/A;  . UPPER GASTROINTESTINAL ENDOSCOPY    . VIDEO ASSISTED THORACOSCOPY (VATS)/WEDGE RESECTION Left 11/20/2015   Procedure: VIDEO ASSISTED THORACOSCOPY (VATS)/LUNG RESECTION;  Surgeon: [name], MD;  Location: [facility];  Service: Thoracic;  Laterality: Left;  Marland Kitchen VIDEO BRONCHOSCOPY N/A  11/20/2015   Procedure: VIDEO BRONCHOSCOPY;  Surgeon: [name], MD;  Location: [facility];  Service: Thoracic;  Laterality: N/A;    There were no vitals filed for this visit.  Subjective Assessment - 12/02/19 1007    Subjective  No issues, brace doing well.  Going to cookout this afternoon.    Pertinent History  HTN, dialysis (T, TH, S), rectal cancer, CHF, CVA on 07/05/18 (R [facility])    Limitations  Walking;Standing;House hold activities    Patient Stated Goals  "Go back fishing"    Currently in Pain?  No/denies         [facility] PT Assessment - 12/02/19 1017      Ambulation/Gait   Gait velocity  3.60 ft/sec with L AFO       Functional Gait  Assessment   Gait assessed   Yes    Gait Level Surface  Walks 20 ft in less than 7 sec but greater than 5.5 sec, uses assistive device, slower speed, mild gait deviations, or deviates 6-10 in  outside of the 12 in walkway width.   6.35   Change in Gait Speed  Able to smoothly change walking speed without loss of balance or gait deviation. Deviate no more than 6 in outside of the 12 in walkway width.    Gait with Horizontal Head Turns  Performs head turns smoothly with no change in gait. Deviates no more than 6 in outside 12 in walkway width    Gait with Vertical Head Turns  Performs head turns with no change in gait. Deviates no more than 6 in outside 12 in walkway width.    Gait and Pivot Turn  Pivot turns safely within 3 sec and stops quickly with no loss of balance.    Step Over Obstacle  Is able to step over 2 stacked shoe boxes taped together (9 in total height) without changing gait speed. No evidence of imbalance.    Gait with Narrow Base of Support  Is able to ambulate for 10 steps heel to toe with no staggering.    Gait with Eyes Closed  Walks 20 ft, uses assistive device, slower speed, mild gait deviations, deviates 6-10 in outside 12 in walkway width. Ambulates 20 ft in less than 9 sec but greater than 7 sec.    Ambulating Backwards  Walks  20 ft, uses assistive device, slower speed, mild gait deviations, deviates 6-10 in outside 12 in walkway width.    Steps  Alternating feet, must use rail.    Total Score  26                    [facility] Adult PT Treatment/Exercise - 12/02/19 1047      Ambulation/Gait   Ambulation/Gait  Yes    Ambulation/Gait Assistance  5: Supervision    Ambulation/Gait Assistance Details  S with min cues for improved heel strike bilaterally.  No overt LOB when going up/down hill outside.      Ambulation Distance (Feet)  350 Feet   indoor and outdoor combined    Assistive device  None   LAFO   Gait Pattern  Decreased dorsiflexion - left;Decreased weight shift to left;Decreased stance time - left;Decreased step length - right;Decreased step length - left;Poor foot clearance - left    Ambulation Surface  Level;Unlevel;Indoor;Outdoor;Grass    Stairs  Yes    Stairs Assistance  5: Supervision    Stairs Assistance Details (indicate cue type and reason)  Pt is able to do without UE support at S level however would recommend single UE support for increased safety.     Stair Management Technique  No rails;Alternating pattern;Forwards    Number of Stairs  8    Height of Stairs  6      Therapeutic Activites    Therapeutic Activities  Other Therapeutic Activities    Other Therapeutic Activities  Had pt practice casting fishing line outdoors on unlevel ground to simulate doing so at pond/lake side.  Pt able to do without LOB, S for safety.       Neuro Re-ed    Neuro Re-ed Details   Pt reports dizziness only getting to 2/10 with seated VOR, therefore performed standing with feet apart on solid ground x 2 sets of 10 reps side/side and 1 set of 10 reps up/down progressing to standing on foam with feet apart with 2 sets of 10 reps side/side and 1 set of 10 reps up/down with dizziness getting to 3-4/10.  Added to HEP (verbally told him to not perform  on medbridge HEP as PT provided via other exercise program).            Self Care:  Discussed how to increase endurance at home while using personal bike.  Recommended he add one level of resistance and keep rpms at 60's.  He is to try this at home and report at next visit to determine if appropriate to increase.    PT Education - 12/02/19 1055    Education Details  likely DC at next visit, updated gaze exercise    Person(s) Educated  Patient    Methods  Explanation;Handout;Demonstration    Comprehension  Verbalized understanding;Returned demonstration       PT Short Term Goals - 11/11/19 1023      PT SHORT TERM GOAL #1   Title  The patient will be indep with HEP for LE strengthening, balance and general mobility. (Target Date: 11/16/19)    Time  4    Period  Weeks    Status  Achieved    Target Date  11/16/19      PT SHORT TERM GOAL #2   Title  Pt will improve FGA to >/=20/30 in order to indicate decreased fall risk.    Baseline  22/30 on 11/11/19    Time  4    Period  Weeks    Status  Achieved      PT SHORT TERM GOAL #3   Title  Pt will improve gait speed to >/=3.40 ft/sec w/ AFO in order to indicate more normal gait speed.    Baseline  3.45 ft/sec with AFO    Time  4    Period  Weeks    Status  Achieved      PT SHORT TERM GOAL #4   Title  Pt will perform 5TSS </=11.5 secs without UE support in order to indicate decreased fall risk.    Baseline  12.45 on 11/11/19 without UE support    Time  4    Period  Weeks    Status  Partially Met        PT Long Term Goals - 12/02/19 1057      PT LONG TERM GOAL #1   Title  The patient will be indep with progression of HEP for post d/c exercise. (Target Date: 12/16/19)    Time  8    Period  Weeks    Status  New      PT LONG TERM GOAL #2   Title  Pt will improve FGA to >/=23/30 in order to decrease fall risk.    Baseline  26/30    Time  8    Period  Weeks    Status  Achieved      PT LONG TERM GOAL #3   Title  Pt will ambulate x 1000' over varying outdoor surfaces without device with  AFO at mod I level in order to return to leisure activiites.    Time  8    Period  Weeks    Status  New      PT LONG TERM GOAL #4   Title  Pt will simulate casting fishing line without LOB in order to return to leisure activity.    Baseline  met 12/02/19    Time  8    Status  Achieved         Progress Note Reporting Period 10/17/19 to 12/02/19  See note below for Objective Data and Assessment of Progress/Goals.  Plan - 12/02/19 1056    Clinical Impression Statement  Skilled session focused on quick assessment of gait speed and FGA for progress note.  Note that he has met LTGs in these areas.  Updated gaze exercise to standing on pillows and began to address casting fishing line outdoors.  Pt should be ready for D/C at next visit once HEP is updated for progress at home.    Personal Factors and Comorbidities  Age;Comorbidity 3+;Time since onset of injury/illness/exacerbation    Examination-Activity Limitations  Lift;Stand;Stairs;Squat;Locomotion Level    Examination-Participation Restrictions  Community Activity;Yard Work;Other   fishing   Stability/Clinical Decision Making  Stable/Uncomplicated    Rehab Potential  Good    PT Frequency  2x / week    PT Duration  8 weeks    PT Treatment/Interventions  ADLs/Self Care Home Management;Aquatic Therapy;DME Instruction;Gait training;Stair training;Functional mobility training;Therapeutic activities;Therapeutic exercise;Balance training;Neuromuscular re-education;Patient/family education;Orthotic Fit/Training;Passive range of motion;Vestibular    PT Next Visit Plan  See how gaze is going, update HEP so that he can progress at home, D/C    Consulted and Agree with Plan of Care  Patient       Patient will benefit from skilled therapeutic intervention in order to improve the following deficits and impairments:  Abnormal gait, Decreased balance, Decreased activity tolerance, Decreased endurance, Decreased knowledge of use of DME,  Decreased mobility, Decreased range of motion, Decreased strength, Impaired flexibility, Postural dysfunction  Visit Diagnosis: Left foot drop  Muscle weakness (generalized)  Unsteadiness on feet  Other abnormalities of gait and mobility     Problem List Patient Active Problem List   Diagnosis Date Noted  . Meralgia paraesthetica, right 08/12/2019  . 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 Gait disturbance, post-stroke 04/17/2019  . Long term (current) use of anticoagulants 12/31/2018  . Atrial fibrillation, unspecified type ([place]) 12/22/2018  . Longstanding persistent atrial fibrillation ([place]) 09/20/2018  . Functional diarrhea 07/14/2018  . Benign essential HTN   . Hyperglycemia   . ESRD on dialysis ([place]) 06/16/2018  . Mild malnutrition ([place]) 06/16/2018  . Stroke due to embolism ([place]) 06/16/2018  . CVA (cerebral vascular accident) ([place]) 06/08/2018  . CHF (congestive heart failure) ([place]) 05/08/2018  . Hyperlipidemia LDL goal <70 09/23/2017  . OAB (overactive bladder) 01/09/2016  . SUI (stress urinary incontinence), male 01/09/2016  . Hypersomnia 10/30/2015  . Obesity (BMI 30.0-34.9) 10/30/2015  . Routine general medical examination at a health care facility 06/04/2015  . Medicare annual wellness visit, subsequent 06/04/2015  . Hereditary and idiopathic peripheral neuropathy 09/14/2014  . Rectal cancer ([place]) 02/01/2013  . Obstructive sleep apnea 07/29/2010  . GOUT 05/11/2007  . ERECTILE DYSFUNCTION, MILD 05/11/2007    Cameron Sprang, PT, MPT [facility] [address] [place], [place], [address] Phone: [phone]   Fax:  [phone] 12/02/19, 11:01 AM  Name: TRELYN VANDERLINDE Sr. MRN: [id number] Date of Birth: May 05, 1946

## 2019-12-02 NOTE — Patient Instructions (Signed)
Gaze Stabilization: Standing Feet Apart (Compliant Surface)    Try to stand somewhere that you have at least a counter behind you and put a chair in front of you.  Use a pillow that is just thick enough to take the firmness of the ground out of the picture.  Feet apart on pillow, keeping eyes on target on wall __4-5__ feet away, tilt head down 15-30 and move head side to side for __10__ reps. Repeat while moving head up and down for __15__ reps. Do __1-2__ sessions per day.  Remember:  I want you to allow dizziness to increase 3 or 4 points above where you started (so if you are 0/10 when you start, get to 3-4/10 with exercises).    Copyright  VHI. All rights reserved.

## 2019-12-05 DIAGNOSIS — I129 Hypertensive chronic kidney disease with stage 1 through stage 4 chronic kidney disease, or unspecified chronic kidney disease: Secondary | ICD-10-CM | POA: Diagnosis not present

## 2019-12-05 DIAGNOSIS — N186 End stage renal disease: Secondary | ICD-10-CM | POA: Diagnosis not present

## 2019-12-05 DIAGNOSIS — Z992 Dependence on renal dialysis: Secondary | ICD-10-CM | POA: Diagnosis not present

## 2019-12-06 DIAGNOSIS — N186 End stage renal disease: Secondary | ICD-10-CM | POA: Diagnosis not present

## 2019-12-06 DIAGNOSIS — Z992 Dependence on renal dialysis: Secondary | ICD-10-CM | POA: Diagnosis not present

## 2019-12-06 DIAGNOSIS — E876 Hypokalemia: Secondary | ICD-10-CM | POA: Diagnosis not present

## 2019-12-06 DIAGNOSIS — R197 Diarrhea, unspecified: Secondary | ICD-10-CM | POA: Diagnosis not present

## 2019-12-06 DIAGNOSIS — R52 Pain, unspecified: Secondary | ICD-10-CM | POA: Diagnosis not present

## 2019-12-06 DIAGNOSIS — N2581 Secondary hyperparathyroidism of renal origin: Secondary | ICD-10-CM | POA: Diagnosis not present

## 2019-12-07 ENCOUNTER — Other Ambulatory Visit: Payer: Self-pay

## 2019-12-07 ENCOUNTER — Ambulatory Visit: Payer: Medicare HMO | Attending: Internal Medicine

## 2019-12-07 DIAGNOSIS — M21372 Foot drop, left foot: Secondary | ICD-10-CM

## 2019-12-07 DIAGNOSIS — R2689 Other abnormalities of gait and mobility: Secondary | ICD-10-CM | POA: Insufficient documentation

## 2019-12-07 DIAGNOSIS — M6281 Muscle weakness (generalized): Secondary | ICD-10-CM | POA: Insufficient documentation

## 2019-12-07 DIAGNOSIS — R2681 Unsteadiness on feet: Secondary | ICD-10-CM | POA: Insufficient documentation

## 2019-12-07 NOTE — Therapy (Signed)
[facility] [address] [place], [place], [address] Phone: [phone]   Fax:  [phone]  Physical Therapy Treatment  Patient Details  Name: Jason K Sesma Sr. MRN: [id number] Date of Birth: 12/11/1945 Referring Provider (PT): [name] Jones, MD  PHYSICAL THERAPY DISCHARGE SUMMARY  Visits from Start of Care: 11  Current functional level related to goals / functional outcomes: See clinical impression statement for details.    Remaining deficits: Low Fall Risk   Education / Equipment: Educated on HEP and completion of personal bike/walking program for further gains in endurance.   Plan: Patient agrees to discharge.  Patient goals were met. Patient is being discharged due to meeting the stated rehab goals.  ?????       Encounter Date: 12/07/2019  PT End of Session - 12/07/19 0935    Visit Number  11    Number of Visits  17    Date for PT Re-Evaluation  12/16/19    Authorization Type  [employer] medicare    Progress Note Due on Visit  10    PT Start Time  0932    PT Stop Time  1014    PT Time Calculation (min)  42 min    Equipment Utilized During Treatment  Gait belt    Activity Tolerance  Patient tolerated treatment well    Behavior During Therapy  WFL for tasks assessed/performed       Past Medical History:  Diagnosis Date  . Anemia   . Arthritis   . Atrial fibrillation (HCC)    on Coumadin   . Chronic renal insufficiency 07/31/2011  . COPD (chronic obstructive pulmonary disease) (HCC)    pt denies  . CVA (cerebral vascular accident) (HCC) 06/2018  . Deficiency anemia 07/20/2012  . Diverticulosis   . Edema 09/21/2010   Qualifier: Diagnosis of  By: Lowne DO, Yvonne    . ERECTILE DYSFUNCTION, MILD 05/11/2007   Qualifier: Diagnosis of  By: Todd MD, Jeffrey A   . Exertional dyspnea 10/30/2015  . GOUT 05/11/2007   Qualifier: Diagnosis of  By: Todd MD, Jeffrey A   . Hereditary and idiopathic peripheral  neuropathy 09/14/2014  . History of radiation therapy 02/21/13-03/31/13   rectum 50.4Gy total dose  . Hypersomnia 10/30/2015  . Hypertension   . Lung nodule 10/30/2015  . OAB (overactive bladder) 01/09/2016  . Obesity (BMI 30.0-34.9) 10/30/2015  . Obstructive sleep apnea 07/29/2010   HST 11/2015 AHI 54.  On autocpap  >> 10 cm   . Peripheral edema 09/19/2013  . rectal ca dx'd 02/01/13   rectal. Radiation and chemotherapy- remains on chemotherapy-next tx. 08-22-13.   . S/P partial lobectomy of lung 11/20/2015  . S/P thoracotomy   . Sleep apnea    wears cpap   . SUI (stress urinary incontinence), male 01/09/2016    Past Surgical History:  Procedure Laterality Date  . A/V FISTULAGRAM Left 06/14/2018   Procedure: A/V FISTULAGRAM;  Surgeon: Cain, Brandon Christopher, MD;  [facility]: [facility];  Service: Cardiovascular;  Laterality: Left;  . AV FISTULA PLACEMENT Left 03/24/2016   Procedure: CREATION OF LEFT BRACIOCEPHALIC ARTERIOVENOUS (AV) FISTULA;  Surgeon: Charles E Fields, MD;  Location: [facility];  Service: Vascular;  Laterality: Left;  . BIOPSY  09/17/2018   Procedure: BIOPSY;  Surgeon: Danis, Henry L III, MD;  Location: [facility];  Service: Gastroenterology;;  . BIOPSY  03/09/2019   Procedure: BIOPSY;  Surgeon: Danis, Henry L III, MD;  Location: [facility];    Service: Gastroenterology;;  . BIOPSY  11/22/2019   Procedure: BIOPSY;  Surgeon: Danis, Henry L III, MD;  Location: [facility];  Service: Gastroenterology;;  . COLON SURGERY  05/20/2013  . COLONOSCOPY  2010   [facility]  . COLONOSCOPY WITH PROPOFOL N/A 09/17/2018   Procedure: COLONOSCOPY WITH PROPOFOL;  Surgeon: Danis, Henry L III, MD;  [facility]: [facility];  Service: Gastroenterology;  Laterality: N/A;  . COLONOSCOPY WITH PROPOFOL N/A 11/22/2019   Procedure: COLONOSCOPY WITH PROPOFOL;  Surgeon: Danis, Henry L III, MD;  [facility]: [facility];  Service: Gastroenterology;  Laterality: N/A;  Hx of colon polyps tt   .  ESOPHAGOGASTRODUODENOSCOPY (EGD) WITH PROPOFOL N/A 09/17/2018   Procedure: ESOPHAGOGASTRODUODENOSCOPY (EGD) WITH PROPOFOL;  Surgeon: Danis, Henry L III, MD;  [facility]: [facility];  Service: Gastroenterology;  Laterality: N/A;  . ESOPHAGOGASTRODUODENOSCOPY (EGD) WITH PROPOFOL N/A 03/09/2019   Procedure: ESOPHAGOGASTRODUODENOSCOPY (EGD) WITH PROPOFOL;  Surgeon: Danis, Henry L III, MD;  [facility];  Service: Gastroenterology;  Laterality: N/A;  . EUS N/A 02/03/2013   Procedure: LOWER ENDOSCOPIC ULTRASOUND (EUS);  Surgeon: Daniel P Jacobs, MD;  [facility]: [facility];  Service: Endoscopy;  Laterality: N/A;  . HEMOSTASIS CLIP PLACEMENT  11/22/2019   Procedure: HEMOSTASIS CLIP PLACEMENT;  Surgeon: Danis, Henry L III, MD;  [facility]: [facility];  Service: Gastroenterology;;  . ILEOSTOMY CLOSURE N/A 08/24/2013   Procedure: CLOSURE OF LOOP ILEOSTOMY ;  Surgeon: Alicia [name], MD;  Location: [facility];  Service: General;  Laterality: N/A;  . IR ANGIO VERTEBRAL SEL SUBCLAVIAN INNOMINATE BILAT MOD SED  06/08/2018  . IR CT HEAD LTD  06/08/2018  . IR INTRAVSC STENT CERV CAROTID W/O EMB-PROT MOD SED INC ANGIO  06/08/2018  . IR PERCUTANEOUS ART THROMBECTOMY/INFUSION INTRACRANIAL INC DIAG ANGIO  06/08/2018  . LAPAROSCOPIC LOW ANTERIOR RESECTION N/A 05/20/2013   Procedure: LAPAROSCOPIC LOW ANTERIOR RESECTION WITH SPLENIC FLEXURE MOBILIZATION;  Surgeon: Alicia [name], MD;  Location: [facility];  Service: General;  Laterality: N/A;  . OSTOMY N/A 05/20/2013   Procedure: diverting OSTOMY;  Surgeon: Alicia [name], MD;  Location: [facility];  Service: General;  Laterality: N/A;  . PERIPHERAL VASCULAR INTERVENTION Left 06/14/2018   Procedure: PERIPHERAL VASCULAR INTERVENTION;  Surgeon: Cain, Brandon Christopher, MD;  [facility];  Service: Cardiovascular;  Laterality: Left;  AVF on the Left  . POLYPECTOMY    . POLYPECTOMY  11/22/2019   Procedure: POLYPECTOMY;  Surgeon: Danis, Henry L III, MD;  [facility]: [facility];  Service: Gastroenterology;;  . RADIOLOGY WITH ANESTHESIA N/A 06/08/2018   Procedure: IR WITH ANESTHESIA;  Surgeon: Radiologist, Medication, MD;  Location: [facility];  Service: Radiology;  Laterality: N/A;  . UPPER GASTROINTESTINAL ENDOSCOPY    . VIDEO ASSISTED THORACOSCOPY (VATS)/WEDGE RESECTION Left 11/20/2015   Procedure: VIDEO ASSISTED THORACOSCOPY (VATS)/LUNG RESECTION;  Surgeon: Edward B Gerhardt, MD;  Location: [facility];  Service: Thoracic;  Laterality: Left;  . VIDEO BRONCHOSCOPY N/A 11/20/2015   Procedure: VIDEO BRONCHOSCOPY;  Surgeon: Edward B Gerhardt, MD;  Location: [facility];  Service: Thoracic;  Laterality: N/A;    There were no vitals filed for this visit.  Subjective Assessment - 12/07/19 0934    Subjective  No new complaints. No falls. No pain. still reports brace doing well, no issues.    Pertinent History  HTN, dialysis (T, TH, S), rectal cancer, CHF, CVA on 07/05/18 ([facility])    Limitations  Walking;Standing;House hold activities    Patient Stated Goals  "Go back fishing"    Currently   in Pain?  No/denies         [facility] PT Assessment - 12/07/19 0936      Functional Gait  Assessment   Gait assessed   Yes    Gait Level Surface  Walks 20 ft in less than 7 sec but greater than 5.5 sec, uses assistive device, slower speed, mild gait deviations, or deviates 6-10 in outside of the 12 in walkway width.    Change in Gait Speed  Able to smoothly change walking speed without loss of balance or gait deviation. Deviate no more than 6 in outside of the 12 in walkway width.    Gait with Horizontal Head Turns  Performs head turns smoothly with no change in gait. Deviates no more than 6 in outside 12 in walkway width    Gait with Vertical Head Turns  Performs head turns with no change in gait. Deviates no more than 6 in outside 12 in walkway width.    Gait and Pivot Turn  Pivot turns safely within 3 sec and stops quickly with no loss of balance.    Step Over Obstacle  Is able to step over  2 stacked shoe boxes taped together (9 in total height) without changing gait speed. No evidence of imbalance.    Gait with Narrow Base of Support  Is able to ambulate for 10 steps heel to toe with no staggering.    Gait with Eyes Closed  Walks 20 ft, uses assistive device, slower speed, mild gait deviations, deviates 6-10 in outside 12 in walkway width. Ambulates 20 ft in less than 9 sec but greater than 7 sec.    Ambulating Backwards  Walks 20 ft, uses assistive device, slower speed, mild gait deviations, deviates 6-10 in outside 12 in walkway width.    Steps  Alternating feet, must use rail.    Total Score  26    FGA comment:  26/30 = Low Fall Risk                    [facility] Adult PT Treatment/Exercise - 12/07/19 0001      Transfers   Transfers  Sit to Stand;Stand to Sit    Sit to Stand  6: Modified independent (Device/Increase time)    Stand to Sit  6: Modified independent (Device/Increase time)      Ambulation/Gait   Ambulation/Gait  Yes    Ambulation/Gait Assistance  6: Modified independent (Device/Increase time)    Ambulation/Gait Assistance Details  Completed gait training on outdoor, unlevel surfaces. Pt completed Mod I with no LOB, and demonstrates improved stability over outdoor surfaces. No AD utilized. Pt reports fatigue at end of completion, but no alteration in gait pattern noted.     Ambulation Distance (Feet)  1200 Feet    Assistive device  None    Gait Pattern  Within Functional Limits   WFL w/ AFO donned   Ambulation Surface  Level;Unlevel;Indoor;Outdoor;Paved;Grass    Stairs  Yes    Stairs Assistance  6: Modified independent (Device/Increase time)    Stairs Assistance Details (indicate cue type and reason)  Completed with single rail, ascending/descending alternating pattern    Stair Management Technique  One rail Right;Alternating pattern;Forwards    Number of Stairs  8    Height of Stairs  6      Exercises   Exercises  Knee/Hip;Other Exercises     Other Exercises   Completed entire review and update of HEP      Completed review   of the following exercises for final HEP:  Access Code: [email or web address] URL: [email or web address] Date: 11/30/2019 Prepared by: Kaitlyn Martin  Exercises Seated Gastroc Stretch with Strap - 2 x daily - 7 x weekly - 1 sets - 3 reps - 30 seconds hold Seated Hamstring Stretch - 1 x daily - 7 x weekly - 1 sets - 3 reps - 30 sec hold Sit to Stand without Arm Support - 1 x daily - 7 x weekly - 2 sets - 10 reps Walking March - 1 x daily - 7 x weekly - 1 sets - 4 reps Tandem Walking with Counter Support - 1 x daily - 7 x weekly - 1 sets - 4 reps Side Stepping with Resistance at Ankles - 1 x daily - 7 x weekly - 1 sets - 4 reps Mini Squat with Counter Support - 1 x daily - 7 x weekly - 2 sets - 10 reps Standing Hip Abduction with Counter Support - 1 x daily - 7 x weekly - 2 sets - 10 reps Standing Hip Extension with Counter Support - 1 x daily - 7 x weekly - 2 sets - 10 reps Standing Gaze Stabilization with Head Rotation - 1 x daily - 7 x weekly - 2 sets - 10 reps   Pt. Overall demo all of the exercises with proper form, requiring minimal cueing for completion. Provided patient with green theraband for side stepping, and educated on progression of other exercises provided.      PT Education - 12/07/19 1057    Education Details  Educated on final HEP, and importance of completion upon discharge for maintaining progress. Progress toward LTG's and discharge.    Person(s) Educated  Patient    Methods  Explanation;Demonstration;Handout    Comprehension  Returned demonstration;Verbalized understanding       PT Short Term Goals - 11/11/19 1023      PT SHORT TERM GOAL #1   Title  The patient will be indep with HEP for LE strengthening, balance and general mobility. (Target Date: 11/16/19)    Time  4    Period  Weeks    Status  Achieved    Target Date  11/16/19      PT SHORT TERM GOAL #2   Title  Pt  will improve FGA to >/=20/30 in order to indicate decreased fall risk.    Baseline  22/30 on 11/11/19    Time  4    Period  Weeks    Status  Achieved      PT SHORT TERM GOAL #3   Title  Pt will improve gait speed to >/=3.40 ft/sec w/ AFO in order to indicate more normal gait speed.    Baseline  3.45 ft/sec with AFO    Time  4    Period  Weeks    Status  Achieved      PT SHORT TERM GOAL #4   Title  Pt will perform 5TSS </=11.5 secs without UE support in order to indicate decreased fall risk.    Baseline  12.45 on 11/11/19 without UE support    Time  4    Period  Weeks    Status  Partially Met        PT Long Term Goals - 12/07/19 0944      PT LONG TERM GOAL #1   Title  The patient will be indep with progression of HEP for post d/c exercise. (Target Date: 12/16/19)    Baseline  Patient verbalizes   independence    Time  8    Period  Weeks    Status  Achieved      PT LONG TERM GOAL #2   Title  Pt will improve FGA to >/=23/30 in order to decrease fall risk.    Baseline  26/30    Time  8    Period  Weeks    Status  Achieved      PT LONG TERM GOAL #3   Title  Pt will ambulate x 1000' over varying outdoor surfaces without device with AFO at mod I level in order to return to leisure activiites.    Baseline  completed 1200 ft outdoors surfaces w/o AD and with personal AFO donned, Mod I    Time  8    Period  Weeks    Status  Achieved      PT LONG TERM GOAL #4   Title  Pt will simulate casting fishing line without LOB in order to return to leisure activity.    Baseline  met 12/02/19    Time  8    Status  Achieved            Plan - 12/07/19 1055    Clinical Impression Statement  Today's skilled PT session included assessment of patients progress toward LTG, patient met 4 out of 4 LTGs and readiness to discharge. Completed entire review of HEP and updated as tolerated by patient, PT verbalizing importance of completion of HEP upon discharge to maintain gains achieved with PT  services. Patient verbalizing agreement and readiness to discharge, no questions or conerns at this time.    Personal Factors and Comorbidities  Age;Comorbidity 3+;Time since onset of injury/illness/exacerbation    Examination-Activity Limitations  Lift;Stand;Stairs;Squat;Locomotion Level    Examination-Participation Restrictions  Community Activity;Yard Work;Other   fishing   Stability/Clinical Decision Making  Stable/Uncomplicated    Rehab Potential  Good    PT Frequency  2x / week    PT Duration  8 weeks    PT Treatment/Interventions  ADLs/Self Care Home Management;Aquatic Therapy;DME Instruction;Gait training;Stair training;Functional mobility training;Therapeutic activities;Therapeutic exercise;Balance training;Neuromuscular re-education;Patient/family education;Orthotic Fit/Training;Passive range of motion;Vestibular    Consulted and Agree with Plan of Care  Patient       Patient will benefit from skilled therapeutic intervention in order to improve the following deficits and impairments:  Abnormal gait, Decreased balance, Decreased activity tolerance, Decreased endurance, Decreased knowledge of use of DME, Decreased mobility, Decreased range of motion, Decreased strength, Impaired flexibility, Postural dysfunction  Visit Diagnosis: Left foot drop  Muscle weakness (generalized)  Unsteadiness on feet  Other abnormalities of gait and mobility     Problem List Patient Active Problem List   Diagnosis Date Noted  . Meralgia paraesthetica, right 08/12/2019  . 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 Gait disturbance, post-stroke 04/17/2019  . Long term (current) use of anticoagulants 12/31/2018  . Atrial fibrillation, unspecified type (HCC) 12/22/2018  . Longstanding persistent atrial fibrillation (HCC) 09/20/2018  . Functional diarrhea 07/14/2018  . Benign essential HTN   . Hyperglycemia   . ESRD on dialysis (HCC) 06/16/2018  . Mild malnutrition (HCC)  06/16/2018  . Stroke due to embolism (HCC) 06/16/2018  . CVA (cerebral vascular accident) (HCC) 06/08/2018  . CHF (congestive heart failure) (HCC) 05/08/2018  . Hyperlipidemia LDL goal <70 09/23/2017  . OAB (overactive bladder) 01/09/2016  . SUI (stress urinary incontinence), male 01/09/2016  . Hypersomnia 10/30/2015  . Obesity (BMI 30.0-34.9) 10/30/2015  . Routine   general medical examination at a health care facility 06/04/2015  . Medicare annual wellness visit, subsequent 06/04/2015  . Hereditary and idiopathic peripheral neuropathy 09/14/2014  . Rectal cancer (HCC) 02/01/2013  . Obstructive sleep apnea 07/29/2010  . GOUT 05/11/2007  . ERECTILE DYSFUNCTION, MILD 05/11/2007    Kaitlyn B Martin, PT, DPT 12/07/2019, 11:00 AM  [facility] [address] [place], [place], [address] Phone: [phone]   Fax:  [phone]  Name: Alrick K Labate Sr. MRN: [id number] Date of Birth: 04/16/1946   

## 2019-12-07 NOTE — Patient Instructions (Signed)
Access Code: [facility] URL: [email or web address] Date: 11/30/2019 Prepared by: Baldomero Lamy  Exercises Seated Gastroc Stretch with Strap - 2 x daily - 7 x weekly - 1 sets - 3 reps - 30 seconds hold Seated Hamstring Stretch - 1 x daily - 7 x weekly - 1 sets - 3 reps - 30 sec hold Sit to Stand without Arm Support - 1 x daily - 7 x weekly - 2 sets - 10 reps Walking March - 1 x daily - 7 x weekly - 1 sets - 4 reps Tandem Walking with Counter Support - 1 x daily - 7 x weekly - 1 sets - 4 reps Side Stepping with Resistance at Ankles - 1 x daily - 7 x weekly - 1 sets - 4 reps Mini Squat with Counter Support - 1 x daily - 7 x weekly - 2 sets - 10 reps Standing Hip Abduction with Counter Support - 1 x daily - 7 x weekly - 2 sets - 10 reps Standing Hip Extension with Counter Support - 1 x daily - 7 x weekly - 2 sets - 10 reps Standing Gaze Stabilization with Head Rotation - 1 x daily - 7 x weekly - 2 sets - 10 reps

## 2019-12-09 ENCOUNTER — Ambulatory Visit: Payer: Medicare HMO

## 2019-12-12 ENCOUNTER — Other Ambulatory Visit: Payer: Self-pay

## 2019-12-12 ENCOUNTER — Encounter: Payer: Self-pay | Admitting: Internal Medicine

## 2019-12-12 ENCOUNTER — Ambulatory Visit (INDEPENDENT_AMBULATORY_CARE_PROVIDER_SITE_OTHER): Payer: Medicare HMO | Admitting: Internal Medicine

## 2019-12-12 VITALS — BP 140/80 | HR 82 | Temp 98.1°F | Resp 16 | Ht 68.0 in | Wt 150.4 lb

## 2019-12-12 DIAGNOSIS — I10 Essential (primary) hypertension: Secondary | ICD-10-CM | POA: Diagnosis not present

## 2019-12-12 DIAGNOSIS — Z0000 Encounter for general adult medical examination without abnormal findings: Secondary | ICD-10-CM

## 2019-12-12 DIAGNOSIS — Z992 Dependence on renal dialysis: Secondary | ICD-10-CM

## 2019-12-12 DIAGNOSIS — N400 Enlarged prostate without lower urinary tract symptoms: Secondary | ICD-10-CM | POA: Diagnosis not present

## 2019-12-12 DIAGNOSIS — E785 Hyperlipidemia, unspecified: Secondary | ICD-10-CM

## 2019-12-12 DIAGNOSIS — I4891 Unspecified atrial fibrillation: Secondary | ICD-10-CM | POA: Diagnosis not present

## 2019-12-12 DIAGNOSIS — N186 End stage renal disease: Secondary | ICD-10-CM | POA: Diagnosis not present

## 2019-12-12 DIAGNOSIS — E441 Mild protein-calorie malnutrition: Secondary | ICD-10-CM | POA: Diagnosis not present

## 2019-12-12 LAB — LIPID PANEL
Cholesterol: 113 mg/dL (ref 0–200)
HDL: 54.1 mg/dL (ref >39.00)
LDL Cholesterol: 44 mg/dL (ref 0–99)
NonHDL: 58.66
Total CHOL/HDL Ratio: 2
Triglycerides: 73 mg/dL (ref 0.0–149.0)
VLDL: 14.6 mg/dL (ref 0.0–40.0)

## 2019-12-12 LAB — PSA: PSA: 1.33 ng/mL (ref 0.10–4.00)

## 2019-12-12 LAB — TSH: TSH: 1.88 u[IU]/mL (ref 0.35–4.50)

## 2019-12-12 NOTE — Patient Instructions (Signed)

## 2019-12-12 NOTE — Progress Notes (Signed)
Subjective:  Patient ID: Jason Cotta Sr., male    DOB: 1946-03-24  Age: 74 y.o. MRN: [id number]  CC: Annual Exam  This visit occurred during the SARS-CoV-2 public health emergency.  Safety protocols were in place, including screening questions prior to the visit, additional usage of staff PPE, and extensive cleaning of exam room while observing appropriate contact time as indicated for disinfecting solutions.    HPI Jason SHADDOCK Sr. presents for a CPX.  He continues to complain of mild, intermittent diarrhea. His symptoms have improved with the addition of a pancreatic supplement and loperamide. He denies any recent episodes of abdominal pain, nausea, vomiting, loss of appetite, blood in the stool, or melena.  Outpatient Medications Prior to Visit  Medication Sig Dispense Refill  . acetaminophen (TYLENOL) 325 MG tablet Take 325 mg by mouth every 4 (four) hours as needed for mild pain or moderate pain.     Marland Kitchen allopurinol (ZYLOPRIM) 100 MG tablet Take 1 tablet by mouth once daily (Patient taking differently: Take 100 mg by mouth daily. ) 90 tablet 0  . atorvastatin (LIPITOR) 20 MG tablet TAKE 1 TABLET BY MOUTH  DAILY AT 6 PM. (Patient taking differently: Take 20 mg by mouth at bedtime. ) 90 tablet 1  . cinacalcet (SENSIPAR) 60 MG tablet Take 60 mg by mouth daily.    Marland Kitchen gabapentin (NEURONTIN) 100 MG capsule Take 1 capsule (100 mg total) by mouth 3 (three) times daily. 90 capsule 5  . isosorbide-hydrALAZINE (BIDIL) 20-37.5 MG tablet Take 1 tablet by mouth 3 (three) times daily. 90 tablet 0  . lipase/protease/amylase (CREON) 36000 UNITS CPEP capsule Take 2 caps with meals daily and 1 cap with snack 210 capsule 6  . lipase/protease/amylase (CREON) 36000 UNITS CPEP capsule Take 2 capsules (72,000 Units total) by mouth 3 (three) times daily with meals. May also take 1 capsule (36,000 Units total) as needed (with snacks). 240 capsule 11  . loperamide (IMODIUM) 2 MG capsule Take 2 mg by mouth as  needed for diarrhea or loose stools.    . metoprolol succinate (TOPROL-XL) 25 MG 24 hr tablet Take 1 tablet (25 mg total) by mouth daily. Please schedule appointment for refills 90 tablet 1  . polycarbophil (FIBERCON) 625 MG tablet Take 625 mg by mouth daily as needed for diarrhea or loose stools.    . warfarin (COUMADIN) 5 MG tablet Take 1 tablet daily except 1/2 tablet on Mon and Fridays or TAKE AS DIRECTED BY ANTICOAGULATION CLINIC (Patient taking differently: Take 2.5-5 mg by mouth See admin instructions. Take 1 tablet daily except 1/2 tablet on Mon and Fridays or TAKE AS DIRECTED BY ANTICOAGULATION CLINIC) 90 tablet 1  . ferric citrate (AURYXIA) 1 GM 210 MG(Fe) tablet Take 420 mg by mouth See admin instructions. Take 420 mg with breakfast, 420 mg with dinner, and 420 mg with a snack at bedtime     No facility-administered medications prior to visit.    ROS Review of Systems  Constitutional: Negative for appetite change, diaphoresis, fatigue and unexpected weight change.  HENT: Negative.  Negative for sore throat and trouble swallowing.   Eyes: Negative.   Respiratory: Negative for cough, chest tightness, shortness of breath and wheezing.   Cardiovascular: Negative for chest pain, palpitations and leg swelling.  Gastrointestinal: Positive for diarrhea. Negative for abdominal pain, blood in stool, constipation, nausea and vomiting.  Endocrine: Negative.   Genitourinary: Positive for decreased urine volume. Negative for difficulty urinating, dysuria and hematuria.  Musculoskeletal:  Negative for arthralgias and myalgias.  Skin: Negative.   Neurological: Negative.  Negative for dizziness, weakness, light-headedness and headaches.  Hematological: Negative for adenopathy. Does not bruise/bleed easily.  Psychiatric/Behavioral: Negative.     Objective:  BP 140/80 (BP Location: Left Arm, Patient Position: Sitting, Cuff Size: Normal)   Pulse 82   Temp 98.1 F (36.7 C) (Oral)   Resp 16   Ht  5\' 8"  (1.727 m)   Wt 150 lb 6 oz (68.2 kg)   SpO2 98%   BMI 22.86 kg/m   BP Readings from Last 3 Encounters:  12/12/19 140/80  11/22/19 (!) 142/69  11/16/19 (!) 157/80    Wt Readings from Last 3 Encounters:  12/12/19 150 lb 6 oz (68.2 kg)  11/22/19 150 lb (68 kg)  11/16/19 153 lb (69.4 kg)    Physical Exam Vitals reviewed. Exam conducted with a chaperone present.  Constitutional:      Appearance: Normal appearance.  HENT:     Nose: Nose normal.     Mouth/Throat:     Mouth: Mucous membranes are moist.  Eyes:     General: No scleral icterus.    Conjunctiva/sclera: Conjunctivae normal.  Cardiovascular:     Rate and Rhythm: Normal rate and regular rhythm.     Heart sounds: No murmur.  Pulmonary:     Effort: Pulmonary effort is normal.     Breath sounds: No stridor. No wheezing, rhonchi or rales.  Abdominal:     General: Abdomen is flat. Bowel sounds are normal. There is no distension.     Palpations: Abdomen is soft. There is no hepatomegaly, splenomegaly or mass.     Tenderness: There is no abdominal tenderness.     Hernia: There is no hernia in the left inguinal area or right inguinal area.  Genitourinary:    Pubic Area: No rash.      Penis: Normal.      Testes: Normal.        Right: Mass not present.        Left: Mass not present.     Epididymis:     Right: Normal.     Left: Normal.     Prostate: Enlarged. Not tender and no nodules present.     Rectum: Normal. Guaiac result negative. No mass, tenderness, anal fissure, external hemorrhoid or internal hemorrhoid. Normal anal tone.  Musculoskeletal:        General: No swelling. Normal range of motion.     Cervical back: Neck supple.     Right lower leg: No edema.     Left lower leg: No edema.  Lymphadenopathy:     Cervical: No cervical adenopathy.     Lower Body: No right inguinal adenopathy. No left inguinal adenopathy.  Skin:    General: Skin is warm and dry.     Coloration: Skin is not pale.  Neurological:      General: No focal deficit present.     Mental Status: He is alert and oriented to person, place, and time. Mental status is at baseline.  Psychiatric:        Mood and Affect: Mood normal.        Behavior: Behavior normal.     Lab Results  Component Value Date   WBC 6.4 04/08/2019   HGB 13.6 11/22/2019   HCT 40.0 11/22/2019   PLT 179 04/08/2019   GLUCOSE 78 11/22/2019   CHOL 113 12/12/2019   TRIG 73.0 12/12/2019   HDL 54.10 12/12/2019  LDLDIRECT 120.0 06/04/2015   LDLCALC 44 12/12/2019   ALT 14 02/26/2019   AST 19 02/26/2019   NA 129 (L) 11/22/2019   K 3.3 (L) 11/22/2019   CL 86 (L) 11/22/2019   CREATININE 6.50 (H) 11/22/2019   BUN 27 (H) 11/22/2019   CO2 25 04/08/2019   TSH 1.88 12/12/2019   PSA 1.33 12/12/2019   INR 3.0 11/28/2019   HGBA1C 5.5 06/09/2018    No results found.  Assessment & Plan:   Gurshaan was seen today for annual exam.  Diagnoses and all orders for this visit:  ESRD on dialysis [facility])- He is doing well on dialysis.  Benign essential HTN- His blood pressure is adequately well controlled.  Atrial fibrillation, unspecified type [facility])- He is maintaining sinus rhythm. Will continue anticoagulation with warfarin. -     TSH; Future -     TSH  Routine general medical examination at a health care facility- Exam completed, labs reviewed, vaccines reviewed, cancer screenings are up-to-date, patient education was given.  Mild malnutrition ([place])- Improvement noted.  Benign prostatic hyperplasia without lower urinary tract symptoms- His PSA is low which is a reassuring sign that he does not have prostate cancer. He has no symptoms that need to be treated. -     PSA; Future -     PSA  Hyperlipidemia LDL goal <70- He has achieved his LDL goal and is doing well on the statin. -     Lipid panel; Future -     TSH; Future -     TSH -     Lipid panel   I have discontinued Jason Cotta Sr.'s ferric citrate. I am also having him maintain his  acetaminophen, warfarin, gabapentin, allopurinol, atorvastatin, metoprolol succinate, cinacalcet, loperamide, polycarbophil, BiDil, lipase/protease/amylase, and lipase/protease/amylase.  No orders of the defined types were placed in this encounter.    Follow-up: Return in about 6 months (around 06/12/2020).  [name], MD

## 2019-12-16 ENCOUNTER — Encounter: Payer: Self-pay | Admitting: Cardiology

## 2019-12-16 ENCOUNTER — Ambulatory Visit: Payer: Medicare HMO | Admitting: Cardiology

## 2019-12-16 ENCOUNTER — Other Ambulatory Visit: Payer: Self-pay

## 2019-12-16 VITALS — BP 162/74 | HR 73 | Ht 68.0 in | Wt 146.0 lb

## 2019-12-16 DIAGNOSIS — I5022 Chronic systolic (congestive) heart failure: Secondary | ICD-10-CM | POA: Diagnosis not present

## 2019-12-16 DIAGNOSIS — I10 Essential (primary) hypertension: Secondary | ICD-10-CM

## 2019-12-16 DIAGNOSIS — I480 Paroxysmal atrial fibrillation: Secondary | ICD-10-CM | POA: Diagnosis not present

## 2019-12-16 DIAGNOSIS — Z8673 Personal history of transient ischemic attack (TIA), and cerebral infarction without residual deficits: Secondary | ICD-10-CM

## 2019-12-16 MED ORDER — BIDIL 20-37.5 MG PO TABS: 1.0000 | ORAL_TABLET | Freq: Three times a day (TID) | ORAL | 0 refills | Status: AC

## 2019-12-16 NOTE — Progress Notes (Signed)
Follow up visit  Subjective:   Jason KOWAL Sr., male    DOB: [birth year], 74 y.o.   MRN: [id number]   HPI  Chief Complaint  Patient presents with   Congestive Heart Failure   Follow-up    73 year old African-American male with end-stage renal disease, presumed nonischemic cardiomyopathy, paroxysmal atrial fibrillation, h/o stroke (06/2018), former smoker, h/o rectal cancer  Patient underwent right MCA stenting after his stroke in December 2019. He does not have any recent TIA/stroke symptoms. Blood pressure elevated today. He has not seen a neurologist recently.   He denies chest pain, shortness of breath, palpitations, leg edema, orthopnea, PND, TIA/syncope.   Current Outpatient Medications on File Prior to Visit  Medication Sig Dispense Refill   acetaminophen (TYLENOL) 325 MG tablet Take 325 mg by mouth every 4 (four) hours as needed for mild pain or moderate pain.      allopurinol (ZYLOPRIM) 100 MG tablet Take 1 tablet by mouth once daily (Patient taking differently: Take 100 mg by mouth daily. ) 90 tablet 0   atorvastatin (LIPITOR) 20 MG tablet TAKE 1 TABLET BY MOUTH  DAILY AT 6 PM. (Patient taking differently: Take 20 mg by mouth at bedtime. ) 90 tablet 1   cinacalcet (SENSIPAR) 60 MG tablet Take 60 mg by mouth daily.     gabapentin (NEURONTIN) 100 MG capsule Take 1 capsule (100 mg total) by mouth 3 (three) times daily. 90 capsule 5   isosorbide-hydrALAZINE (BIDIL) 20-37.5 MG tablet Take 1 tablet by mouth 3 (three) times daily. 90 tablet 0   lipase/protease/amylase (CREON) 36000 UNITS CPEP capsule Take 2 caps with meals daily and 1 cap with snack 210 capsule 6   lipase/protease/amylase (CREON) 36000 UNITS CPEP capsule Take 2 capsules (72,000 Units total) by mouth 3 (three) times daily with meals. May also take 1 capsule (36,000 Units total) as needed (with snacks). 240 capsule 11   loperamide (IMODIUM) 2 MG capsule Take 2 mg by mouth as needed for diarrhea or loose  stools.     metoprolol succinate (TOPROL-XL) 25 MG 24 hr tablet Take 1 tablet (25 mg total) by mouth daily. Please schedule appointment for refills 90 tablet 1   polycarbophil (FIBERCON) 625 MG tablet Take 625 mg by mouth daily as needed for diarrhea or loose stools.     warfarin (COUMADIN) 5 MG tablet Take 1 tablet daily except 1/2 tablet on Mon and Fridays or TAKE AS DIRECTED BY ANTICOAGULATION CLINIC (Patient taking differently: Take 2.5-5 mg by mouth See admin instructions. Take 1 tablet daily except 1/2 tablet on Mon and Fridays or TAKE AS DIRECTED BY ANTICOAGULATION CLINIC) 90 tablet 1   [DISCONTINUED] atorvastatin (LIPITOR) 20 MG tablet TAKE 1 TABLET BY MOUTH  DAILY AT 6 PM. 90 tablet 1   No current facility-administered medications on file prior to visit.    Cardiovascular & other pertient studies:  EKG 11/16/2019: Sinus rhythm 74 bpm.  Nonspecific ST-T abnormality  MRA head 09/2019: 1. Increased, mild right supraclinoid ICA stenosis. 2. Unchanged atherosclerotic narrowing elsewhere including moderate left supraclinoid ICA, mild basilar artery, and severe proximal right P2 stenoses. 3. Patent right MCA stent. 4. Unchanged 2 mm right cavernous ICA aneurysm.   Echocardiogram 06/2018: - Left ventricle: The cavity size was mildly dilated. Systolic  function was severely reduced. The estimated ejection fraction  was in the range of 20% to 25%. Diffuse hypokinesis.  - Left atrium: The atrium was mildly dilated.  - Right atrium: The atrium was  mildly dilated.  - Atrial septum: No defect or patent foramen ovale was identified.   Impressions:   - Findings consistent with dilated cardiomyopathy. Consider TEE if  clinically indicated. No obvious source of cerebral embolism.   MRA Head 06/2018: 1. Patchy acute right MCA infarcts with greatest involvement of the basal ganglia. 2. Mild chronic small vessel ischemic disease. 3. Continued patency of the intracranial right  ICA, right ACA, and proximal right MCA following revascularization. Artifact from right MCA stent with decreased number of distal MCA branch vessels. 4. Mild right and moderate to severe left supraclinoid ICA stenoses. 5. Moderate bilateral ACA origin stenoses. 6. Occluded distal right vertebral artery. 7. Severe proximal right P2 stenosis. 8. 2 mm right cavernous ICA aneurysm.   MRI Brain 06/2018: 1. Patchy acute right MCA infarcts with greatest involvement of the basal ganglia. 2. Mild chronic small vessel ischemic disease. 3. Continued patency of the intracranial right ICA, right ACA, and proximal right MCA following revascularization. Artifact from right MCA stent with decreased number of distal MCA branch vessels. 4. Mild right and moderate to severe left supraclinoid ICA stenoses. 5. Moderate bilateral ACA origin stenoses. 6. Occluded distal right vertebral artery. 7. Severe proximal right P2 stenosis. 8. 2 mm right cavernous ICA aneurysm.  CT head 06/2018: 1. Hyperdense Right MCA M1 segment compatible with ELVO. Subtle cytotoxic edema suspected in the right insula, ASPECTS is 9. 2. Negative for bleed. 3. These results were communicated to Dr. Rory Percy at 3:33 pmon 12/3/2019by text page via the [facility] messaging system.    Recent labs: 12/12/2019: Chol 113, TG 73, HDL 54, LDL 44 TSH 1.8 normal  11/22/2019: Glucose 78, BUN/Cr 27/6.5. EGFR 8. Na/K 129/3.3. Rest of the CMP normal H/H 13.6/40     Review of Systems  Cardiovascular: Negative for chest pain, dyspnea on exertion, leg swelling, palpitations and syncope.         Vitals:   12/16/19 1329 12/16/19 1331  BP: (!) 164/80 (!) 162/74  Pulse: 77 73  SpO2:  98%     Body mass index is 22.2 kg/m. Filed Weights   12/16/19 1329  Weight: 146 lb (66.2 kg)     Objective:   Physical Exam Vitals and nursing note reviewed.  Constitutional:      General: He is not in acute distress. Neck:     Vascular: No  JVD.  Cardiovascular:     Rate and Rhythm: Normal rate and regular rhythm.     Pulses: Intact distal pulses.     Heart sounds: Murmur (Continuous murmur, likely due to hemodyalysis access) heard.   Pulmonary:     Effort: Pulmonary effort is normal.     Breath sounds: Normal breath sounds. No wheezing or rales.         Assessment & Recommendations:   74 year old African-American male with end-stage renal disease, presumed nonischemic cardiomyopathy, paroxysmal atrial fibrillation, h/o stroke (06/2018), former smoker, h/o rectal cancer  HFrEF: Clinically euvolumic. However, Na 129 suggests underlying heart failure. Management remains primary around hemodialysis. Conitnue Bidil and metoprolol succinate. Needs remote blood pressure monitoring and futher adjustments as necessary.    Paroxysmal Afib: CHA2DS2VAsc score 4, annual stoke risk 5% Currently on warfarin, managed by PCP  H/o stroke: Consider f/u w/neurology as well as IR given his ICA stenosis. He tells me he saw someone for follow up, but cannot recall name. Defer to PCP.  Nigel Mormon, MD [facility] Pager: [phone] Office: [phone]

## 2019-12-19 DIAGNOSIS — G4733 Obstructive sleep apnea (adult) (pediatric): Secondary | ICD-10-CM | POA: Diagnosis not present

## 2019-12-23 ENCOUNTER — Ambulatory Visit: Payer: Medicare HMO

## 2019-12-23 ENCOUNTER — Other Ambulatory Visit: Payer: Self-pay | Admitting: Internal Medicine

## 2019-12-23 ENCOUNTER — Other Ambulatory Visit: Payer: Self-pay

## 2019-12-23 DIAGNOSIS — I5022 Chronic systolic (congestive) heart failure: Secondary | ICD-10-CM

## 2019-12-26 ENCOUNTER — Other Ambulatory Visit: Payer: Self-pay

## 2019-12-26 ENCOUNTER — Ambulatory Visit (INDEPENDENT_AMBULATORY_CARE_PROVIDER_SITE_OTHER): Payer: Medicare HMO | Admitting: General Practice

## 2019-12-26 DIAGNOSIS — Z7901 Long term (current) use of anticoagulants: Secondary | ICD-10-CM | POA: Diagnosis not present

## 2019-12-26 DIAGNOSIS — I4891 Unspecified atrial fibrillation: Secondary | ICD-10-CM | POA: Diagnosis not present

## 2019-12-26 LAB — POCT INR: INR: 1.7 — AB (ref 2.0–3.0)

## 2019-12-26 NOTE — Patient Instructions (Addendum)
Pre visit review using our clinic review tool, if applicable. No additional management support is needed unless otherwise documented below in the visit note.  Take 1 tablet today and then change dosage and take 1 tablet daily except 1/2 tablet on Mon and Fridays.   Re-check in 4 weeks.

## 2019-12-30 ENCOUNTER — Other Ambulatory Visit: Payer: Self-pay | Admitting: Internal Medicine

## 2019-12-30 DIAGNOSIS — Z7901 Long term (current) use of anticoagulants: Secondary | ICD-10-CM

## 2020-01-04 DIAGNOSIS — I10 Essential (primary) hypertension: Secondary | ICD-10-CM | POA: Diagnosis not present

## 2020-01-04 DIAGNOSIS — Z992 Dependence on renal dialysis: Secondary | ICD-10-CM | POA: Diagnosis not present

## 2020-01-04 DIAGNOSIS — I129 Hypertensive chronic kidney disease with stage 1 through stage 4 chronic kidney disease, or unspecified chronic kidney disease: Secondary | ICD-10-CM | POA: Diagnosis not present

## 2020-01-04 DIAGNOSIS — N186 End stage renal disease: Secondary | ICD-10-CM | POA: Diagnosis not present

## 2020-01-05 DIAGNOSIS — E876 Hypokalemia: Secondary | ICD-10-CM | POA: Diagnosis not present

## 2020-01-05 DIAGNOSIS — R52 Pain, unspecified: Secondary | ICD-10-CM | POA: Diagnosis not present

## 2020-01-05 DIAGNOSIS — R197 Diarrhea, unspecified: Secondary | ICD-10-CM | POA: Diagnosis not present

## 2020-01-05 DIAGNOSIS — D631 Anemia in chronic kidney disease: Secondary | ICD-10-CM | POA: Diagnosis not present

## 2020-01-05 DIAGNOSIS — D509 Iron deficiency anemia, unspecified: Secondary | ICD-10-CM | POA: Diagnosis not present

## 2020-01-05 DIAGNOSIS — N186 End stage renal disease: Secondary | ICD-10-CM | POA: Diagnosis not present

## 2020-01-05 DIAGNOSIS — Z992 Dependence on renal dialysis: Secondary | ICD-10-CM | POA: Diagnosis not present

## 2020-01-05 DIAGNOSIS — L299 Pruritus, unspecified: Secondary | ICD-10-CM | POA: Diagnosis not present

## 2020-01-05 DIAGNOSIS — N2581 Secondary hyperparathyroidism of renal origin: Secondary | ICD-10-CM | POA: Diagnosis not present

## 2020-01-11 ENCOUNTER — Telehealth: Payer: Self-pay | Admitting: Internal Medicine

## 2020-01-11 DIAGNOSIS — E785 Hyperlipidemia, unspecified: Secondary | ICD-10-CM

## 2020-01-11 NOTE — Progress Notes (Signed)
  Chronic Care Management   Note  01/11/2020 Name: Jason FAIRLEY Sr. MRN: [id number] DOB: 1945-10-25  Jason Cotta Sr. is a 74 y.o. year old male who is a primary care patient of [name], MD. I reached out to [place] by phone today in response to a referral sent by [name]'s PCP, [name], MD.   Jason [name] was given information about Chronic Care Management services today including:  1. CCM service includes personalized support from designated clinical staff supervised by his physician, including individualized plan of care and coordination with other care providers 2. 24/7 contact phone numbers for assistance for urgent and routine care needs. 3. Service will only be billed when office clinical staff spend 20 minutes or more in a month to coordinate care. 4. Only one practitioner may furnish and bill the service in a calendar month. 5. The patient may stop CCM services at any time (effective at the end of the month) by phone call to the office staff.   Patient agreed to services and verbal consent obtained.  This note is not being shared with the patient for the following reason: To respect privacy (The patient or proxy has requested that the information not be shared).  Follow up plan:   Earney [place] [facility] Scheduler

## 2020-01-13 ENCOUNTER — Telehealth: Payer: Self-pay | Admitting: Pharmacist

## 2020-01-13 ENCOUNTER — Telehealth: Payer: Self-pay | Admitting: Internal Medicine

## 2020-01-13 DIAGNOSIS — I4891 Unspecified atrial fibrillation: Secondary | ICD-10-CM

## 2020-01-13 MED ORDER — METOPROLOL SUCCINATE ER 25 MG PO TB24: 25.0000 mg | ORAL_TABLET | Freq: Every day | ORAL | 1 refills | Status: DC

## 2020-01-13 NOTE — Telephone Encounter (Signed)
Pt called stating that he had an episode of lightheadedness yesterday after his dialysis session. Pt reports associated dizziness.Denies any syncope or falls. Reports that he has no previous similar episodes. Reports to recurrence of episodes since. Home wrist BP monitor states that pt is in irregular heart rate. Pt w/ hx of Afib and on metoprolol 25 mg for rate control.  On warfarin for anticoagulation, managed by PCP. Denies any complains of CP, palpitations, SOB, fatigue. Pt to continue monitoring and to call the office if the symptoms continue to persists or worsens. Reviewed s/sx of stroke to monitor and when to call EMS. Pt w/ hx of stroke (06/2018). Med list reviewed and updated together.

## 2020-01-13 NOTE — Telephone Encounter (Signed)
erx sent as requested.  

## 2020-01-13 NOTE — Telephone Encounter (Signed)
  1.Medication Requested: metoprolol succinate (TOPROL-XL) 25 MG 24 hr tablet  2. Pharmacy (Name, Street, [place]):[facility]  3. On Med List: yes  4. Last Visit with PCP: 12/12/19  5. Next visit date with PCP:01/16/20   Agent: Please be advised that RX refills may take up to 3 business days. We ask that you follow-up with your pharmacy.

## 2020-01-16 ENCOUNTER — Encounter: Payer: Self-pay | Admitting: Internal Medicine

## 2020-01-16 ENCOUNTER — Other Ambulatory Visit: Payer: Self-pay

## 2020-01-16 ENCOUNTER — Ambulatory Visit (INDEPENDENT_AMBULATORY_CARE_PROVIDER_SITE_OTHER): Payer: Medicare HMO | Admitting: Internal Medicine

## 2020-01-16 VITALS — BP 140/70 | HR 78 | Temp 99.8°F | Resp 16 | Ht 68.0 in | Wt 148.0 lb

## 2020-01-16 DIAGNOSIS — N410 Acute prostatitis: Secondary | ICD-10-CM | POA: Insufficient documentation

## 2020-01-16 DIAGNOSIS — R509 Fever, unspecified: Secondary | ICD-10-CM | POA: Insufficient documentation

## 2020-01-16 DIAGNOSIS — R310 Gross hematuria: Secondary | ICD-10-CM | POA: Insufficient documentation

## 2020-01-16 LAB — POC URINALSYSI DIPSTICK (AUTOMATED)
Bilirubin, UA: NEGATIVE
Blood, UA: POSITIVE
Glucose, UA: NEGATIVE
Ketones, UA: NEGATIVE
Nitrite, UA: NEGATIVE
Protein, UA: POSITIVE — AB
Spec Grav, UA: 1.015 (ref 1.010–1.025)
Urobilinogen, UA: 0.2 E.U./dL
pH, UA: 6 (ref 5.0–8.0)

## 2020-01-16 MED ORDER — CIPROFLOXACIN HCL 250 MG PO TABS: 250.0000 mg | ORAL_TABLET | Freq: Two times a day (BID) | ORAL | 0 refills | Status: AC

## 2020-01-16 NOTE — Patient Instructions (Signed)
Prostatitis  Prostatitis is swelling or inflammation of the prostate gland. The prostate is a walnut-sized gland that is involved in the production of semen. It is located below a man's bladder, in front of the rectum. There are four types of prostatitis:  Chronic nonbacterial prostatitis. This is the most common type of prostatitis. It may be associated with a viral infection or autoimmune disorder.  Acute bacterial prostatitis. This is the least common type of prostatitis. It starts quickly and is usually associated with a bladder infection, high fever, and shaking chills. It can occur at any age.  Chronic bacterial prostatitis. This type usually results from acute bacterial prostatitis that happens repeatedly (is recurrent) or has not been treated properly. It can occur in men of any age but is most common among middle-aged men whose prostate has begun to get larger. The symptoms are not as severe as symptoms caused by acute bacterial prostatitis.  Prostatodynia or chronic pelvic pain syndrome (CPPS). This type is also called pelvic floor disorder. It is associated with increased muscular tone in the pelvis surrounding the prostate. What are the causes? Bacterial prostatitis is caused by infection from bacteria. Chronic nonbacterial prostatitis may be caused by:  Urinary tract infections (UTIs).  Nerve damage.  A response by the body's disease-fighting system (autoimmune response).  Chemicals in the urine. The causes of the other types of prostatitis are usually not known. What are the signs or symptoms? Symptoms of this condition vary depending upon the type of prostatitis. If you have acute bacterial prostatitis, you may experience:  Urinary symptoms, such as: ? Painful urination. ? Burning during urination. ? Frequent and sudden urges to urinate. ? Inability to start urinating. ? A weak or interrupted stream of urine.  Vomiting.  Nausea.  Fever.  Chills.  Inability to  empty the bladder completely.  Pain in the: ? Muscles or joints. ? Lower back. ? Lower abdomen. If you have any of the other types of prostatitis, you may experience:  Urinary symptoms, such as: ? Sudden urges to urinate. ? Frequent urination. ? Difficulty starting urination. ? Weak urine stream. ? Dribbling after urination.  Discharge from the urethra. The urethra is a tube that opens at the end of the penis.  Pain in the: ? Testicles. ? Penis or tip of the penis. ? Rectum. ? Area in front of the rectum and below the scrotum (perineum).  Problems with sexual function.  Painful ejaculation.  Bloody semen. How is this diagnosed? This condition may be diagnosed based on:  A physical and medical exam.  Your symptoms.  A urine test to check for bacteria.  An exam in which a health care provider uses a finger to feel the prostate (digital rectal exam).  A test of a sample of semen.  Blood tests.  Ultrasound.  Removal of prostate tissue to be examined under a microscope (biopsy).  Tests to check how your body handles urine (urodynamic tests).  A test to look inside your bladder or urethra (cystoscopy). How is this treated? Treatment for this condition depends on the type of prostatitis. Treatment may involve:  Medicines to relieve pain or inflammation.  Medicines to help relax your muscles.  Physical therapy.  Heat therapy.  Techniques to help you control certain body functions (biofeedback).  Relaxation exercises.  Antibiotic medicine, if your condition is caused by bacteria.  Warm water baths (sitz baths). Sitz baths help with relaxing your pelvic floor muscles, which helps to relieve pressure on the prostate.   Follow these instructions at home:   Take over-the-counter and prescription medicines only as told by your health care provider.  If you were prescribed an antibiotic, take it as told by your health care provider. Do not stop taking the  antibiotic even if you start to feel better.  If physical therapy, biofeedback, or relaxation exercises were prescribed, do exercises as instructed.  Take sitz baths as directed by your health care provider. For a sitz bath, sit in warm water that is deep enough to cover your hips and buttocks.  Keep all follow-up visits as told by your health care provider. This is important. Contact a health care provider if:  Your symptoms get worse.  You have a fever. Get help right away if:  You have chills.  You feel nauseous.  You vomit.  You feel light-headed or feel like you are going to faint.  You are unable to urinate.  You have blood or blood clots in your urine. This information is not intended to replace advice given to you by your health care provider. Make sure you discuss any questions you have with your health care provider. Document Revised: 09/05/2017 Document Reviewed: 03/13/2016 Elsevier Patient Education  2020 [employer]  

## 2020-01-16 NOTE — Progress Notes (Signed)
Subjective:  Patient ID: Jason Cotta Sr., male    DOB: December 19, 1945  Age: 74 y.o. MRN: [id number]  CC: Urinary Tract Infection  This visit occurred during the SARS-CoV-2 public health emergency.  Safety protocols were in place, including screening questions prior to the visit, additional usage of staff PPE, and extensive cleaning of exam room while observing appropriate contact time as indicated for disinfecting solutions.    HPI Jason VONADA Sr. presents for concerns about a 4-day history of gross hematuria, dysuria, and urgency.  He denies abdominal pain, flank pain, nausea, vomiting, fever, or chills.  Outpatient Medications Prior to Visit  Medication Sig Dispense Refill  . acetaminophen (TYLENOL) 325 MG tablet Take 325 mg by mouth every 4 (four) hours as needed for mild pain or moderate pain.     Marland Kitchen allopurinol (ZYLOPRIM) 100 MG tablet Take 1 tablet by mouth once daily (Patient taking differently: Taking it in morning) 90 tablet 0  . atorvastatin (LIPITOR) 20 MG tablet TAKE 1 TABLET BY MOUTH  DAILY AT 6 PM. (Patient taking differently: Take 20 mg by mouth at bedtime. Taking in the morning (after dialysis)) 90 tablet 1  . cinacalcet (SENSIPAR) 60 MG tablet Take 60 mg by mouth daily.    Marland Kitchen FOSRENOL 1000 MG PACK Take by mouth. Using it with meal twice a day    . gabapentin (NEURONTIN) 100 MG capsule Take 1 capsule (100 mg total) by mouth 3 (three) times daily. 90 capsule 5  . lipase/protease/amylase (CREON) 36000 UNITS CPEP capsule Take 2 capsules (72,000 Units total) by mouth 3 (three) times daily with meals. May also take 1 capsule (36,000 Units total) as needed (with snacks). 240 capsule 11  . loperamide (IMODIUM) 2 MG capsule Take 2 mg by mouth as needed for diarrhea or loose stools.    . metoprolol succinate (TOPROL-XL) 25 MG 24 hr tablet Take 1 tablet (25 mg total) by mouth daily. 90 tablet 1  . polycarbophil (FIBERCON) 625 MG tablet Take 625 mg by mouth daily as needed for diarrhea or  loose stools.    . warfarin (COUMADIN) 5 MG tablet TAKE 1 TABLET BY MOUTH DAILY, EXCEPT 1/2 TABLET ON MONDAY AND FRIDAYS OR TAKE AS DIRECTED BY ANTICOAGULATION CLINIC 90 tablet 0   No facility-administered medications prior to visit.    ROS Review of Systems  Constitutional: Negative for chills, diaphoresis, fatigue and fever.  HENT: Negative.  Negative for sore throat.   Eyes: Negative.   Respiratory: Negative for cough, chest tightness, shortness of breath and wheezing.   Cardiovascular: Negative for chest pain, palpitations and leg swelling.  Gastrointestinal: Negative for abdominal pain, constipation, diarrhea, nausea and vomiting.  Genitourinary: Positive for decreased urine volume, dysuria and hematuria. Negative for difficulty urinating, discharge, flank pain, frequency, scrotal swelling and urgency.  Musculoskeletal: Negative.   Skin: Negative.  Negative for rash.  Neurological: Negative.  Negative for dizziness and weakness.  Hematological: Negative for adenopathy. Does not bruise/bleed easily.  Psychiatric/Behavioral: Negative.     Objective:  BP 140/70 (BP Location: Left Arm, Patient Position: Sitting, Cuff Size: Normal)   Pulse 78   Temp 99.8 F (37.7 C) (Oral)   Resp 16   Ht 5\' 8"  (1.727 m)   Wt 148 lb (67.1 kg)   SpO2 95%   BMI 22.50 kg/m   BP Readings from Last 3 Encounters:  01/16/20 140/70  12/16/19 (!) 162/74  12/12/19 140/80    Wt Readings from Last 3 Encounters:  01/16/20  148 lb (67.1 kg)  12/16/19 146 lb (66.2 kg)  12/12/19 150 lb 6 oz (68.2 kg)    Physical Exam Vitals reviewed.  Constitutional:      Appearance: Normal appearance.  HENT:     Nose: Nose normal.     Mouth/Throat:     Mouth: Mucous membranes are moist.  Eyes:     General: No scleral icterus.    Conjunctiva/sclera: Conjunctivae normal.  Cardiovascular:     Rate and Rhythm: Normal rate and regular rhythm.     Heart sounds: No murmur heard.   Pulmonary:     Effort:  Pulmonary effort is normal.     Breath sounds: No stridor. No wheezing, rhonchi or rales.  Abdominal:     General: Abdomen is flat.     Palpations: There is no mass.     Tenderness: There is no abdominal tenderness. There is no guarding.     Hernia: There is no hernia in the left inguinal area or right inguinal area.  Genitourinary:    Pubic Area: No rash.      Penis: Normal and circumcised. No discharge, swelling or lesions.      Testes: Normal.        Right: Mass, tenderness or swelling not present.        Left: Mass, tenderness or swelling not present.     Epididymis:     Right: Normal. Not inflamed or enlarged. No mass.     Left: Not inflamed or enlarged. No mass.     Prostate: Enlarged and tender. No nodules present.     Rectum: Normal. Guaiac result negative. No mass, tenderness, anal fissure, external hemorrhoid or internal hemorrhoid.     Comments: His prostate gland is mildly enlarged and it is tender and boggy. Musculoskeletal:        General: Normal range of motion.     Cervical back: Neck supple.     Right lower leg: No edema.     Left lower leg: No edema.  Lymphadenopathy:     Cervical: No cervical adenopathy.     Lower Body: No right inguinal adenopathy. No left inguinal adenopathy.  Skin:    General: Skin is warm and dry.     Coloration: Skin is not pale.  Neurological:     Mental Status: He is alert.     Lab Results  Component Value Date   WBC 6.4 04/08/2019   HGB 13.6 11/22/2019   HCT 40.0 11/22/2019   PLT 179 04/08/2019   GLUCOSE 78 11/22/2019   CHOL 113 12/12/2019   TRIG 73.0 12/12/2019   HDL 54.10 12/12/2019   LDLDIRECT 120.0 06/04/2015   LDLCALC 44 12/12/2019   ALT 14 02/26/2019   AST 19 02/26/2019   NA 129 (L) 11/22/2019   K 3.3 (L) 11/22/2019   CL 86 (L) 11/22/2019   CREATININE 6.50 (H) 11/22/2019   BUN 27 (H) 11/22/2019   CO2 25 04/08/2019   TSH 1.88 12/12/2019   PSA 1.33 12/12/2019   INR 1.7 (A) 12/26/2019   HGBA1C 5.5 06/09/2018        Yellow       Clarity, UA  cloudy     Clear     Glucose, UA Negative Negative     Negative R     Bilirubin, UA  Negative     Negative     Ketones, UA  Negative     Negative     Spec Grav, UA 1.010 - 1.025  1.015     1.025 R     Blood, UA  Positive     Negative     Comment: 80 Ery / uL  pH, UA 5.0 - 8.0 6.0     6.0     Protein, UA Negative PositiveAbnormal     30 mg/dL R     Comment: 300 mg/dL  Urobilinogen, UA 0.2 or 1.0 E.U./dL 0.2     0.2 R  0.2 R  0.2 R   Nitrite, UA  Negative     Negative     Leukocytes, UA Negative Large (3+)Abnormal  NEGATIVE R  NEGATIVE R  NEGATIVE R  Negative  NEGATIVE  NEGATIVE   Comment: 500 Leu/uL      Assessment & Plan:   Jason [name] was seen today for urinary tract infection.  Diagnoses and all orders for this visit:  Gross hematuria -     CULTURE, URINE COMPREHENSIVE; Future -     POCT Urinalysis Dipstick (Automated) -     CULTURE, URINE COMPREHENSIVE  Fever, unspecified fever cause -     CULTURE, URINE COMPREHENSIVE; Future -     CULTURE, URINE COMPREHENSIVE  Acute prostatitis with hematuria- He has UTI symptoms with a low-grade fever and a urinalysis that is positive for 3+ leukocytes.  Urine culture is pending.  He is on hemodialysis.  I think the best treatment for his acute prostatitis is renally dosed Cipro twice a day for the next 3 weeks. -     ciprofloxacin (CIPRO) 250 MG tablet; Take 1 tablet (250 mg total) by mouth 2 (two) times daily for 21 days.   I am having Jason Cotta Sr. start on ciprofloxacin. I am also having him maintain his acetaminophen, gabapentin, atorvastatin, cinacalcet, loperamide, polycarbophil, lipase/protease/amylase, allopurinol, warfarin, Fosrenol, and metoprolol succinate.  Meds ordered this encounter  Medications  . ciprofloxacin (CIPRO) 250 MG tablet    Sig: Take 1 tablet (250 mg total) by mouth 2 (two) times daily for 21 days.    Dispense:  42 tablet    Refill:  0     Follow-up: Return in about 4  weeks (around 02/13/2020).  [name], MD

## 2020-01-17 DIAGNOSIS — E213 Hyperparathyroidism, unspecified: Secondary | ICD-10-CM | POA: Diagnosis not present

## 2020-01-17 DIAGNOSIS — R310 Gross hematuria: Secondary | ICD-10-CM | POA: Diagnosis not present

## 2020-01-17 DIAGNOSIS — E785 Hyperlipidemia, unspecified: Secondary | ICD-10-CM | POA: Diagnosis not present

## 2020-01-17 DIAGNOSIS — G629 Polyneuropathy, unspecified: Secondary | ICD-10-CM | POA: Diagnosis not present

## 2020-01-17 DIAGNOSIS — I739 Peripheral vascular disease, unspecified: Secondary | ICD-10-CM | POA: Diagnosis not present

## 2020-01-17 DIAGNOSIS — Z992 Dependence on renal dialysis: Secondary | ICD-10-CM | POA: Diagnosis not present

## 2020-01-17 DIAGNOSIS — R509 Fever, unspecified: Secondary | ICD-10-CM | POA: Diagnosis not present

## 2020-01-17 DIAGNOSIS — D6869 Other thrombophilia: Secondary | ICD-10-CM | POA: Diagnosis not present

## 2020-01-17 DIAGNOSIS — N186 End stage renal disease: Secondary | ICD-10-CM | POA: Diagnosis not present

## 2020-01-17 DIAGNOSIS — I69354 Hemiplegia and hemiparesis following cerebral infarction affecting left non-dominant side: Secondary | ICD-10-CM | POA: Diagnosis not present

## 2020-01-17 DIAGNOSIS — I4891 Unspecified atrial fibrillation: Secondary | ICD-10-CM | POA: Diagnosis not present

## 2020-01-17 DIAGNOSIS — E8809 Other disorders of plasma-protein metabolism, not elsewhere classified: Secondary | ICD-10-CM | POA: Diagnosis not present

## 2020-01-18 ENCOUNTER — Encounter: Payer: Self-pay | Admitting: Podiatry

## 2020-01-18 ENCOUNTER — Ambulatory Visit: Payer: Medicare HMO | Admitting: Podiatry

## 2020-01-18 ENCOUNTER — Other Ambulatory Visit: Payer: Self-pay

## 2020-01-18 DIAGNOSIS — L84 Corns and callosities: Secondary | ICD-10-CM

## 2020-01-18 DIAGNOSIS — M79675 Pain in left toe(s): Secondary | ICD-10-CM

## 2020-01-18 DIAGNOSIS — G629 Polyneuropathy, unspecified: Secondary | ICD-10-CM

## 2020-01-18 DIAGNOSIS — G4733 Obstructive sleep apnea (adult) (pediatric): Secondary | ICD-10-CM | POA: Diagnosis not present

## 2020-01-18 DIAGNOSIS — M2012 Hallux valgus (acquired), left foot: Secondary | ICD-10-CM

## 2020-01-18 DIAGNOSIS — B351 Tinea unguium: Secondary | ICD-10-CM | POA: Diagnosis not present

## 2020-01-18 DIAGNOSIS — M79674 Pain in right toe(s): Secondary | ICD-10-CM | POA: Diagnosis not present

## 2020-01-18 DIAGNOSIS — Z992 Dependence on renal dialysis: Secondary | ICD-10-CM | POA: Diagnosis not present

## 2020-01-18 DIAGNOSIS — M2011 Hallux valgus (acquired), right foot: Secondary | ICD-10-CM

## 2020-01-18 DIAGNOSIS — N186 End stage renal disease: Secondary | ICD-10-CM

## 2020-01-18 NOTE — Progress Notes (Signed)
Subjective:  Patient ID: Jason Cotta Sr., male    DOB: [birth year],  MRN: [id number]  Jason Cotta Sr. presents to clinic today for at risk foot care. Patient has h/o ESRD on hemodialysis and painful callus(es) b/l heels and painful thick toenails that are difficult to trim. Pain interferes with ambulation. Aggravating factors include wearing enclosed shoe gear. Pain is relieved with periodic professional debridement..  74 y.o. male presents with the above complaint.  Reports painfully elongated nails to both feet. He states he continues to use a pillow between his heels for the calluses when in bed.   Review of Systems: Negative except as noted in the HPI. Past Medical History:  Diagnosis Date  . Anemia   . Arthritis   . Atrial fibrillation ([place])    on Coumadin   . Chronic renal insufficiency 07/31/2011  . COPD (chronic obstructive pulmonary disease) ([place])    pt denies  . CVA (cerebral vascular accident) ([place]) 06/2018  . Deficiency anemia 07/20/2012  . Diverticulosis   . Edema 09/21/2010   Qualifier: Diagnosis of  By: Jerold Coombe    . ERECTILE DYSFUNCTION, MILD 05/11/2007   Qualifier: Diagnosis of  By: Sherren Mocha MD, Jory Ee   . Exertional dyspnea 10/30/2015  . GOUT 05/11/2007   Qualifier: Diagnosis of  By: Sherren Mocha MD, Jory Ee   . Hereditary and idiopathic peripheral neuropathy 09/14/2014  . History of radiation therapy 02/21/13-03/31/13   rectum 50.4Gy total dose  . Hypersomnia 10/30/2015  . Hypertension   . Lung nodule 10/30/2015  . OAB (overactive bladder) 01/09/2016  . Obesity (BMI 30.0-34.9) 10/30/2015  . Obstructive sleep apnea 07/29/2010   HST 11/2015 AHI 54.  On autocpap  >> 10 cm   . Peripheral edema 09/19/2013  . rectal ca dx'd 02/01/13   rectal. Radiation and chemotherapy- remains on chemotherapy-next tx. 08-22-13.   . S/P partial lobectomy of lung 11/20/2015  . S/P thoracotomy   . Sleep apnea    wears cpap   . SUI (stress urinary incontinence), male 01/09/2016   Past Surgical  History:  Procedure Laterality Date  . A/V FISTULAGRAM Left 06/14/2018   Procedure: A/V FISTULAGRAM;  Surgeon: Waynetta Sandy, MD;  Location: [facility];  Service: Cardiovascular;  Laterality: Left;  . AV FISTULA PLACEMENT Left 03/24/2016   Procedure: CREATION OF LEFT BRACIOCEPHALIC ARTERIOVENOUS (AV) FISTULA;  Surgeon: Elam Dutch, MD;  Location: [facility];  Service: Vascular;  Laterality: Left;  . BIOPSY  09/17/2018   Procedure: BIOPSY;  Surgeon: Doran Stabler, MD;  Location: [facility];  Service: Gastroenterology;;  . BIOPSY  03/09/2019   Procedure: BIOPSY;  Surgeon: Doran Stabler, MD;  Location: [facility];  Service: Gastroenterology;;  . BIOPSY  11/22/2019   Procedure: BIOPSY;  Surgeon: Doran Stabler, MD;  Location: [facility];  Service: Gastroenterology;;  . COLON SURGERY  05/20/2013  . COLONOSCOPY  2010   [facility]  . COLONOSCOPY WITH PROPOFOL N/A 09/17/2018   Procedure: COLONOSCOPY WITH PROPOFOL;  Surgeon: Doran Stabler, MD;  Location: [facility];  Service: Gastroenterology;  Laterality: N/A;  . COLONOSCOPY WITH PROPOFOL N/A 11/22/2019   Procedure: COLONOSCOPY WITH PROPOFOL;  Surgeon: Doran Stabler, MD;  Location: [facility];  Service: Gastroenterology;  Laterality: N/A;  Hx of colon polyps tt   . ESOPHAGOGASTRODUODENOSCOPY (EGD) WITH PROPOFOL N/A 09/17/2018   Procedure: ESOPHAGOGASTRODUODENOSCOPY (EGD) WITH PROPOFOL;  Surgeon: Doran Stabler, MD;  Location: [facility];  Service: Gastroenterology;  Laterality: N/A;  . ESOPHAGOGASTRODUODENOSCOPY (EGD) WITH PROPOFOL N/A 03/09/2019   Procedure: ESOPHAGOGASTRODUODENOSCOPY (EGD) WITH PROPOFOL;  Surgeon: Doran Stabler, MD;  Location: [facility];  Service: Gastroenterology;  Laterality: N/A;  . EUS N/A 02/03/2013   Procedure: LOWER ENDOSCOPIC ULTRASOUND (EUS);  Surgeon: Milus Banister, MD;  Location: [facility];  Service: Endoscopy;  Laterality: N/A;  . HEMOSTASIS CLIP PLACEMENT   11/22/2019   Procedure: HEMOSTASIS CLIP PLACEMENT;  Surgeon: Doran Stabler, MD;  Location: [facility];  Service: Gastroenterology;;  . Lucianne Lei CLOSURE N/A 08/24/2013   Procedure: CLOSURE OF LOOP ILEOSTOMY ;  Surgeon: Leighton Ruff, MD;  Location: [facility];  Service: General;  Laterality: N/A;  . IR ANGIO VERTEBRAL SEL SUBCLAVIAN INNOMINATE BILAT MOD SED  06/08/2018  . IR CT HEAD LTD  06/08/2018  . IR INTRAVSC STENT CERV CAROTID W/O EMB-PROT MOD SED INC ANGIO  06/08/2018  . IR PERCUTANEOUS ART THROMBECTOMY/INFUSION INTRACRANIAL INC DIAG ANGIO  06/08/2018  . LAPAROSCOPIC LOW ANTERIOR RESECTION N/A 05/20/2013   Procedure: LAPAROSCOPIC LOW ANTERIOR RESECTION WITH SPLENIC FLEXURE MOBILIZATION;  Surgeon: Leighton Ruff, MD;  Location: [facility];  Service: General;  Laterality: N/A;  . OSTOMY N/A 05/20/2013   Procedure: diverting OSTOMY;  Surgeon: Leighton Ruff, MD;  Location: [facility];  Service: General;  Laterality: N/A;  . PERIPHERAL VASCULAR INTERVENTION Left 06/14/2018   Procedure: PERIPHERAL VASCULAR INTERVENTION;  Surgeon: Waynetta Sandy, MD;  Location: [facility];  Service: Cardiovascular;  Laterality: Left;  AVF on the Left  . POLYPECTOMY    . POLYPECTOMY  11/22/2019   Procedure: POLYPECTOMY;  Surgeon: Doran Stabler, MD;  Location: [facility];  Service: Gastroenterology;;  . RADIOLOGY WITH ANESTHESIA N/A 06/08/2018   Procedure: IR WITH ANESTHESIA;  Surgeon: Radiologist, Medication, MD;  Location: [facility];  Service: Radiology;  Laterality: N/A;  . UPPER GASTROINTESTINAL ENDOSCOPY    . VIDEO ASSISTED THORACOSCOPY (VATS)/WEDGE RESECTION Left 11/20/2015   Procedure: VIDEO ASSISTED THORACOSCOPY (VATS)/LUNG RESECTION;  Surgeon: [name], MD;  Location: [facility];  Service: Thoracic;  Laterality: Left;  Marland Kitchen VIDEO BRONCHOSCOPY N/A 11/20/2015   Procedure: VIDEO BRONCHOSCOPY;  Surgeon: [name], MD;  Location: [facility];  Service: Thoracic;  Laterality: N/A;    Current  Outpatient Medications:  .  acetaminophen (TYLENOL) 325 MG tablet, Take 325 mg by mouth every 4 (four) hours as needed for mild pain or moderate pain. , Disp: , Rfl:  .  allopurinol (ZYLOPRIM) 100 MG tablet, Take 1 tablet by mouth once daily (Patient taking differently: Taking it in morning), Disp: 90 tablet, Rfl: 0 .  atorvastatin (LIPITOR) 20 MG tablet, TAKE 1 TABLET BY MOUTH  DAILY AT 6 PM. (Patient taking differently: Take 20 mg by mouth at bedtime. Taking in the morning (after dialysis)), Disp: 90 tablet, Rfl: 1 .  cinacalcet (SENSIPAR) 60 MG tablet, Take 60 mg by mouth daily., Disp: , Rfl:  .  ciprofloxacin (CIPRO) 250 MG tablet, Take 1 tablet (250 mg total) by mouth 2 (two) times daily for 21 days., Disp: 42 tablet, Rfl: 0 .  FOSRENOL 1000 MG PACK, Take by mouth. Using it with meal twice a day, Disp: , Rfl:  .  gabapentin (NEURONTIN) 100 MG capsule, Take 1 capsule (100 mg total) by mouth 3 (three) times daily., Disp: 90 capsule, Rfl: 5 .  lipase/protease/amylase (CREON) 36000 UNITS CPEP capsule, Take 2 capsules (72,000 Units total) by mouth 3 (three) times daily with meals. May  also take 1 capsule (36,000 Units total) as needed (with snacks)., Disp: 240 capsule, Rfl: 11 .  loperamide (IMODIUM) 2 MG capsule, Take 2 mg by mouth as needed for diarrhea or loose stools., Disp: , Rfl:  .  metoprolol succinate (TOPROL-XL) 25 MG 24 hr tablet, Take 1 tablet (25 mg total) by mouth daily., Disp: 90 tablet, Rfl: 1 .  polycarbophil (FIBERCON) 625 MG tablet, Take 625 mg by mouth daily as needed for diarrhea or loose stools., Disp: , Rfl:  .  warfarin (COUMADIN) 5 MG tablet, TAKE 1 TABLET BY MOUTH DAILY, EXCEPT 1/2 TABLET ON MONDAY AND FRIDAYS OR TAKE AS DIRECTED BY ANTICOAGULATION CLINIC, Disp: 90 tablet, Rfl: 0 Allergies  Allergen Reactions  . Nsaids Other (See Comments)    Asked by surgeon to add this medication class as intolerance due to patients renal insufficiency.  . Lactase Other (See Comments)  .  Lactose Intolerance (Gi) Diarrhea  . Phentermine Other (See Comments)  . Amlodipine Other (See Comments)    Edema    Social History   Occupational History    Comment: retired Insurance account manager  Tobacco Use  . Smoking status: Former Smoker    Packs/day: 0.50    Years: 10.00    Pack years: 5.00    Types: Cigarettes    Quit date: 07/07/1977    Years since quitting: 42.5  . Smokeless tobacco: Never Used  Vaping Use  . Vaping Use: Never used  Substance and Sexual Activity  . Alcohol use: Not Currently    Alcohol/week: 0.0 standard drinks  . Drug use: No  . Sexual activity: Not Currently    Objective:   Constitutional Jason BELL Sr. is a pleasant 74 y.o. African American male, WD, WN in NAD. AAO x 3.   Vascular Dorsalis pedis pulses faintly palpable bilaterally. Posterior tibial pulses palpable bilaterally. Capillary refill time <3 seconds b/l.  No cyanosis or clubbing noted. Pedal hair growth absent b/l. No ischemia or gangrene noted b/l LE.  Neurologic Normal speech. Oriented to person, place, and time. Protective sensation diminished b/l LE.  Dermatologic Pedal skin with normal turgor, texture and tone b/l.  Nails thick, elongated, dystrophic with pain on palpation x 10. Hyperkeratotic lesions medial aspect of heelpads b/l with tenderness to palpation. No edema, no erythema, no drainage, no flocculence. No open wounds. No skin lesions.  Orthopedic: Normal joint ROM without pain or crepitus bilaterally. HAV with bunion deformity right >left foot. No bony tenderness.   Radiographs: None Assessment:   1. Pain due to onychomycosis of toenails of both feet   2. Callus   3. Hallux valgus, acquired, bilateral   4. Neuropathy   5. ESRD on dialysis [facility])    Plan:  Patient was evaluated and treated and all questions answered.  Onychomycosis with pain -Nails palliatively debridement as below -Educated on self-care  Procedure: Nail Debridement Rationale: Pain Type of  Debridement: manual, sharp debridement. Instrumentation: Nail nipper, rotary burr. Number of Nails: 10 -Examined patient. -No new findings. No new orders. -Toenails 1-5 b/l were debrided in length and girth with sterile nail nippers and dremel without iatrogenic bleeding.  -Callus(es) b/l heels pared utilizing sterile scalpel blade without complication or incident. Total number debrided =2. Continue to use pillow between feet when in bed. -Patient to report any pedal injuries to medical professional immediately. -Patient to continue soft, supportive shoe gear daily. -Patient/POA to call should there be question/concern in the interim.  Return in about 9 weeks (around 03/21/2020)  for nail trim.  Marzetta Board, DPM

## 2020-01-19 LAB — CULTURE, URINE COMPREHENSIVE

## 2020-01-21 ENCOUNTER — Other Ambulatory Visit: Payer: Self-pay | Admitting: Cardiology

## 2020-01-21 DIAGNOSIS — I5022 Chronic systolic (congestive) heart failure: Secondary | ICD-10-CM

## 2020-01-23 ENCOUNTER — Ambulatory Visit (INDEPENDENT_AMBULATORY_CARE_PROVIDER_SITE_OTHER): Payer: Medicare HMO | Admitting: General Practice

## 2020-01-23 ENCOUNTER — Other Ambulatory Visit: Payer: Self-pay

## 2020-01-23 DIAGNOSIS — I4891 Unspecified atrial fibrillation: Secondary | ICD-10-CM

## 2020-01-23 DIAGNOSIS — Z7901 Long term (current) use of anticoagulants: Secondary | ICD-10-CM

## 2020-01-23 LAB — POCT INR: INR: 3.7 — AB (ref 2.0–3.0)

## 2020-01-23 NOTE — Patient Instructions (Signed)
Pre visit review using our clinic review tool, if applicable. No additional management support is needed unless otherwise documented below in the visit note.  Skip coumadin today and then change dosage and take 1 tablet daily except 1/2 tablet on Mon Wed and Fridays.   Re-check in 3 weeks.

## 2020-02-04 DIAGNOSIS — Z992 Dependence on renal dialysis: Secondary | ICD-10-CM | POA: Diagnosis not present

## 2020-02-04 DIAGNOSIS — N186 End stage renal disease: Secondary | ICD-10-CM | POA: Diagnosis not present

## 2020-02-04 DIAGNOSIS — I10 Essential (primary) hypertension: Secondary | ICD-10-CM | POA: Diagnosis not present

## 2020-02-04 DIAGNOSIS — I129 Hypertensive chronic kidney disease with stage 1 through stage 4 chronic kidney disease, or unspecified chronic kidney disease: Secondary | ICD-10-CM | POA: Diagnosis not present

## 2020-02-06 DIAGNOSIS — R197 Diarrhea, unspecified: Secondary | ICD-10-CM | POA: Diagnosis not present

## 2020-02-06 DIAGNOSIS — R52 Pain, unspecified: Secondary | ICD-10-CM | POA: Diagnosis not present

## 2020-02-06 DIAGNOSIS — N186 End stage renal disease: Secondary | ICD-10-CM | POA: Diagnosis not present

## 2020-02-06 DIAGNOSIS — D509 Iron deficiency anemia, unspecified: Secondary | ICD-10-CM | POA: Diagnosis not present

## 2020-02-06 DIAGNOSIS — N2581 Secondary hyperparathyroidism of renal origin: Secondary | ICD-10-CM | POA: Diagnosis not present

## 2020-02-06 DIAGNOSIS — Z992 Dependence on renal dialysis: Secondary | ICD-10-CM | POA: Diagnosis not present

## 2020-02-06 DIAGNOSIS — L299 Pruritus, unspecified: Secondary | ICD-10-CM | POA: Diagnosis not present

## 2020-02-06 DIAGNOSIS — E876 Hypokalemia: Secondary | ICD-10-CM | POA: Diagnosis not present

## 2020-02-07 DIAGNOSIS — N2581 Secondary hyperparathyroidism of renal origin: Secondary | ICD-10-CM | POA: Diagnosis not present

## 2020-02-07 DIAGNOSIS — R197 Diarrhea, unspecified: Secondary | ICD-10-CM | POA: Diagnosis not present

## 2020-02-07 DIAGNOSIS — Z992 Dependence on renal dialysis: Secondary | ICD-10-CM | POA: Diagnosis not present

## 2020-02-07 DIAGNOSIS — D509 Iron deficiency anemia, unspecified: Secondary | ICD-10-CM | POA: Diagnosis not present

## 2020-02-07 DIAGNOSIS — N186 End stage renal disease: Secondary | ICD-10-CM | POA: Diagnosis not present

## 2020-02-07 DIAGNOSIS — L299 Pruritus, unspecified: Secondary | ICD-10-CM | POA: Diagnosis not present

## 2020-02-07 DIAGNOSIS — E876 Hypokalemia: Secondary | ICD-10-CM | POA: Diagnosis not present

## 2020-02-07 DIAGNOSIS — R52 Pain, unspecified: Secondary | ICD-10-CM | POA: Diagnosis not present

## 2020-02-09 DIAGNOSIS — R52 Pain, unspecified: Secondary | ICD-10-CM | POA: Diagnosis not present

## 2020-02-09 DIAGNOSIS — N2581 Secondary hyperparathyroidism of renal origin: Secondary | ICD-10-CM | POA: Diagnosis not present

## 2020-02-09 DIAGNOSIS — R197 Diarrhea, unspecified: Secondary | ICD-10-CM | POA: Diagnosis not present

## 2020-02-09 DIAGNOSIS — Z992 Dependence on renal dialysis: Secondary | ICD-10-CM | POA: Diagnosis not present

## 2020-02-09 DIAGNOSIS — L299 Pruritus, unspecified: Secondary | ICD-10-CM | POA: Diagnosis not present

## 2020-02-09 DIAGNOSIS — E876 Hypokalemia: Secondary | ICD-10-CM | POA: Diagnosis not present

## 2020-02-09 DIAGNOSIS — D509 Iron deficiency anemia, unspecified: Secondary | ICD-10-CM | POA: Diagnosis not present

## 2020-02-09 DIAGNOSIS — N186 End stage renal disease: Secondary | ICD-10-CM | POA: Diagnosis not present

## 2020-02-11 DIAGNOSIS — Z992 Dependence on renal dialysis: Secondary | ICD-10-CM | POA: Diagnosis not present

## 2020-02-11 DIAGNOSIS — N186 End stage renal disease: Secondary | ICD-10-CM | POA: Diagnosis not present

## 2020-02-11 DIAGNOSIS — E876 Hypokalemia: Secondary | ICD-10-CM | POA: Diagnosis not present

## 2020-02-11 DIAGNOSIS — R52 Pain, unspecified: Secondary | ICD-10-CM | POA: Diagnosis not present

## 2020-02-11 DIAGNOSIS — D509 Iron deficiency anemia, unspecified: Secondary | ICD-10-CM | POA: Diagnosis not present

## 2020-02-11 DIAGNOSIS — L299 Pruritus, unspecified: Secondary | ICD-10-CM | POA: Diagnosis not present

## 2020-02-11 DIAGNOSIS — N2581 Secondary hyperparathyroidism of renal origin: Secondary | ICD-10-CM | POA: Diagnosis not present

## 2020-02-11 DIAGNOSIS — R197 Diarrhea, unspecified: Secondary | ICD-10-CM | POA: Diagnosis not present

## 2020-02-13 ENCOUNTER — Ambulatory Visit (INDEPENDENT_AMBULATORY_CARE_PROVIDER_SITE_OTHER): Payer: Medicare Other | Admitting: Internal Medicine

## 2020-02-13 ENCOUNTER — Other Ambulatory Visit: Payer: Self-pay

## 2020-02-13 ENCOUNTER — Encounter: Payer: Self-pay | Admitting: Internal Medicine

## 2020-02-13 VITALS — BP 150/70 | HR 70 | Temp 99.0°F | Resp 16 | Ht 68.0 in | Wt 146.0 lb

## 2020-02-13 DIAGNOSIS — J069 Acute upper respiratory infection, unspecified: Secondary | ICD-10-CM | POA: Diagnosis not present

## 2020-02-13 DIAGNOSIS — N410 Acute prostatitis: Secondary | ICD-10-CM | POA: Diagnosis not present

## 2020-02-13 MED ORDER — MUCINEX DM 30-600 MG PO TB12: 1.0000 | ORAL_TABLET | Freq: Two times a day (BID) | ORAL | 0 refills | Status: DC | PRN

## 2020-02-13 NOTE — Progress Notes (Signed)
Subjective:  Patient ID: Jason Cotta Sr., male    DOB: 05-23-1946  Age: 74 y.o. MRN: [id number]  CC: Cough and Urinary Tract Infection  This visit occurred during the SARS-CoV-2 public health emergency.  Safety protocols were in place, including screening questions prior to the visit, additional usage of staff PPE, and extensive cleaning of exam room while observing appropriate contact time as indicated for disinfecting solutions.    HPI Jason GOODENOW Sr. presents for f/up - He completes of a 3-day course of Cipro today for the treatment of acute bacterial prostatitis with hematuria.  He tells me that all of his lower urinary tract symptoms have resolved.  Today he complains of a 1 week history of nonproductive cough.  He denies chest pain, wheezing, hemoptysis, fever, or chills.  Outpatient Medications Prior to Visit  Medication Sig Dispense Refill   acetaminophen (TYLENOL) 325 MG tablet Take 325 mg by mouth every 4 (four) hours as needed for mild pain or moderate pain.      allopurinol (ZYLOPRIM) 100 MG tablet Take 1 tablet by mouth once daily (Patient taking differently: Taking it in morning) 90 tablet 0   atorvastatin (LIPITOR) 20 MG tablet TAKE 1 TABLET BY MOUTH  DAILY AT 6 PM. (Patient taking differently: Take 20 mg by mouth at bedtime. Taking in the morning (after dialysis)) 90 tablet 1   BIDIL 20-37.5 MG tablet TAKE 1 TABLET BY MOUTH THREE TIMES DAILY 90 tablet 0   cinacalcet (SENSIPAR) 60 MG tablet Take 60 mg by mouth daily.     FOSRENOL 1000 MG PACK Take by mouth. Using it with meal twice a day     gabapentin (NEURONTIN) 100 MG capsule Take 1 capsule (100 mg total) by mouth 3 (three) times daily. 90 capsule 5   lipase/protease/amylase (CREON) 36000 UNITS CPEP capsule Take 2 capsules (72,000 Units total) by mouth 3 (three) times daily with meals. May also take 1 capsule (36,000 Units total) as needed (with snacks). 240 capsule 11   loperamide (IMODIUM) 2 MG capsule Take 2 mg  by mouth as needed for diarrhea or loose stools.     metoprolol succinate (TOPROL-XL) 25 MG 24 hr tablet Take 1 tablet (25 mg total) by mouth daily. 90 tablet 1   polycarbophil (FIBERCON) 625 MG tablet Take 625 mg by mouth daily as needed for diarrhea or loose stools.     warfarin (COUMADIN) 5 MG tablet TAKE 1 TABLET BY MOUTH DAILY, EXCEPT 1/2 TABLET ON MONDAY AND FRIDAYS OR TAKE AS DIRECTED BY ANTICOAGULATION CLINIC 90 tablet 0   No facility-administered medications prior to visit.    ROS Review of Systems  Constitutional: Negative for appetite change, chills, diaphoresis, fatigue and fever.  HENT: Negative.  Negative for sore throat and trouble swallowing.   Respiratory: Positive for cough. Negative for chest tightness, shortness of breath and wheezing.   Cardiovascular: Negative for chest pain, palpitations and leg swelling.  Gastrointestinal: Negative for abdominal pain, diarrhea, nausea and vomiting.  Endocrine: Negative.   Genitourinary: Negative.  Negative for difficulty urinating, dysuria, frequency, hematuria, scrotal swelling and urgency.  Musculoskeletal: Negative.  Negative for arthralgias and myalgias.  Skin: Negative.   Neurological: Negative.  Negative for dizziness, weakness, light-headedness and headaches.  Hematological: Negative for adenopathy. Does not bruise/bleed easily.  Psychiatric/Behavioral: Negative.     Objective:  BP (!) 150/70 (BP Location: Left Arm, Patient Position: Sitting, Cuff Size: Normal)    Pulse 70    Temp 99 F (37.2  C) (Oral)    Resp 16    Ht 5\' 8"  (1.727 m)    Wt 146 lb (66.2 kg)    SpO2 97%    BMI 22.20 kg/m   BP Readings from Last 3 Encounters:  02/13/20 (!) 150/70  01/16/20 140/70  12/16/19 (!) 162/74    Wt Readings from Last 3 Encounters:  02/13/20 146 lb (66.2 kg)  01/16/20 148 lb (67.1 kg)  12/16/19 146 lb (66.2 kg)    Physical Exam Vitals reviewed.  Constitutional:      Appearance: Normal appearance. He is not  ill-appearing.  HENT:     Nose: Nose normal.     Mouth/Throat:     Mouth: Mucous membranes are moist.  Eyes:     General: No scleral icterus.    Conjunctiva/sclera: Conjunctivae normal.  Cardiovascular:     Rate and Rhythm: Normal rate and regular rhythm.  Pulmonary:     Effort: Pulmonary effort is normal.     Breath sounds: No stridor. No wheezing, rhonchi or rales.  Abdominal:     General: Abdomen is flat.     Palpations: There is no mass.     Tenderness: There is no abdominal tenderness. There is no guarding.  Musculoskeletal:        General: Normal range of motion.     Cervical back: Neck supple.     Right lower leg: No edema.     Left lower leg: No edema.  Lymphadenopathy:     Cervical: No cervical adenopathy.  Skin:    General: Skin is warm and dry.  Neurological:     General: No focal deficit present.     Mental Status: He is alert.     Lab Results  Component Value Date   WBC 6.4 04/08/2019   HGB 13.6 11/22/2019   HCT 40.0 11/22/2019   PLT 179 04/08/2019   GLUCOSE 78 11/22/2019   CHOL 113 12/12/2019   TRIG 73.0 12/12/2019   HDL 54.10 12/12/2019   LDLDIRECT 120.0 06/04/2015   LDLCALC 44 12/12/2019   ALT 14 02/26/2019   AST 19 02/26/2019   NA 129 (L) 11/22/2019   K 3.3 (L) 11/22/2019   CL 86 (L) 11/22/2019   CREATININE 6.50 (H) 11/22/2019   BUN 27 (H) 11/22/2019   CO2 25 04/08/2019   TSH 1.88 12/12/2019   PSA 1.33 12/12/2019   INR 3.7 (A) 01/23/2020   HGBA1C 5.5 06/09/2018    No results found.  Assessment & Plan:   Jason [name] was seen today for cough and urinary tract infection.  Diagnoses and all orders for this visit:  Viral upper respiratory tract infection- Symptoms and exam are consistent with a viral URI.  I recommended he treat this symptomatically with dextromethorphan and guaifenesin. -     Dextromethorphan-guaiFENesin (MUCINEX DM) 30-600 MG TB12; Take 1 tablet by mouth 2 (two) times daily as needed.  Acute prostatitis with hematuria-  According to his symptoms this has been adequately treated.   I am having Jason Cotta Sr. start on Mucinex DM. I am also having him maintain his acetaminophen, gabapentin, atorvastatin, cinacalcet, loperamide, polycarbophil, lipase/protease/amylase, allopurinol, warfarin, Fosrenol, metoprolol succinate, and BiDil.  Meds ordered this encounter  Medications   Dextromethorphan-guaiFENesin (MUCINEX DM) 30-600 MG TB12    Sig: Take 1 tablet by mouth 2 (two) times daily as needed.    Dispense:  28 tablet    Refill:  0     Follow-up: Return if symptoms worsen or fail  to improve.  [name], MD

## 2020-02-13 NOTE — Patient Instructions (Signed)
Viral Respiratory Infection A respiratory infection is an illness that affects part of the respiratory system, such as the lungs, nose, or throat. A respiratory infection that is caused by a virus is called a viral respiratory infection. Common types of viral respiratory infections include:  A cold.  The flu (influenza).  A respiratory syncytial virus (RSV) infection. What are the causes? This condition is caused by a virus. What are the signs or symptoms? Symptoms of this condition include:  A stuffy or runny nose.  Yellow or green nasal discharge.  A cough.  Sneezing.  Fatigue.  Achy muscles.  A sore throat.  Sweating or chills.  A fever.  A headache. How is this diagnosed? This condition may be diagnosed based on:  Your symptoms.  A physical exam.  Testing of nasal swabs. How is this treated? This condition may be treated with medicines, such as:  Antiviral medicine. This may shorten the length of time a person has symptoms.  Expectorants. These make it easier to cough up mucus.  Decongestant nasal sprays.  Acetaminophen or NSAIDs to relieve fever and pain. Antibiotic medicines are not prescribed for viral infections. This is because antibiotics are designed to kill bacteria. They are not effective against viruses. Follow these instructions at home:  Managing pain and congestion  Take over-the-counter and prescription medicines only as told by your health care provider.  If you have a sore throat, gargle with a salt-water mixture 3-4 times a day or as needed. To make a salt-water mixture, completely dissolve -1 tsp of salt in 1 cup of warm water.  Use nose drops made from salt water to ease congestion and soften raw skin around your nose.  Drink enough fluid to keep your urine pale yellow. This helps prevent dehydration and helps loosen up mucus. General instructions  Rest as much as possible.  Do not drink alcohol.  Do not use any products  that contain nicotine or tobacco, such as cigarettes and e-cigarettes. If you need help quitting, ask your health care provider.  Keep all follow-up visits as told by your health care provider. This is important. How is this prevented?   Get an annual flu shot. You may get the flu shot in late summer, fall, or winter. Ask your health care provider when you should get your flu shot.  Avoid exposing others to your respiratory infection. ? Stay home from work or school as told by your health care provider. ? Wash your hands with soap and water often, especially after you cough or sneeze. If soap and water are not available, use alcohol-based hand sanitizer.  Avoid contact with people who are sick during cold and flu season. This is generally fall and winter. Contact a health care provider if:  Your symptoms last for 10 days or longer.  Your symptoms get worse over time.  You have a fever.  You have severe sinus pain in your face or forehead.  The glands in your jaw or neck become very swollen. Get help right away if you:  Feel pain or pressure in your chest.  Have shortness of breath.  Faint or feel like you will faint.  Have severe and persistent vomiting.  Feel confused or disoriented. Summary  A respiratory infection is an illness that affects part of the respiratory system, such as the lungs, nose, or throat. A respiratory infection that is caused by a virus is called a viral respiratory infection.  Common types of viral respiratory infections are a  cold, influenza, and respiratory syncytial virus (RSV) infection.  Symptoms of this condition include a stuffy or runny nose, cough, sneezing, fatigue, achy muscles, sore throat, and fevers or chills.  Antibiotic medicines are not prescribed for viral infections. This is because antibiotics are designed to kill bacteria. They are not effective against viruses. This information is not intended to replace advice given to you by  your health care provider. Make sure you discuss any questions you have with your health care provider. Document Revised: 07/01/2018 Document Reviewed: 08/03/2017 Elsevier Patient Education  2020 [employer].

## 2020-02-14 DIAGNOSIS — N186 End stage renal disease: Secondary | ICD-10-CM | POA: Diagnosis not present

## 2020-02-14 DIAGNOSIS — L299 Pruritus, unspecified: Secondary | ICD-10-CM | POA: Diagnosis not present

## 2020-02-14 DIAGNOSIS — R197 Diarrhea, unspecified: Secondary | ICD-10-CM | POA: Diagnosis not present

## 2020-02-14 DIAGNOSIS — N2581 Secondary hyperparathyroidism of renal origin: Secondary | ICD-10-CM | POA: Diagnosis not present

## 2020-02-14 DIAGNOSIS — R52 Pain, unspecified: Secondary | ICD-10-CM | POA: Diagnosis not present

## 2020-02-14 DIAGNOSIS — Z992 Dependence on renal dialysis: Secondary | ICD-10-CM | POA: Diagnosis not present

## 2020-02-14 DIAGNOSIS — D509 Iron deficiency anemia, unspecified: Secondary | ICD-10-CM | POA: Diagnosis not present

## 2020-02-14 DIAGNOSIS — E876 Hypokalemia: Secondary | ICD-10-CM | POA: Diagnosis not present

## 2020-02-15 ENCOUNTER — Other Ambulatory Visit: Payer: Self-pay

## 2020-02-15 ENCOUNTER — Ambulatory Visit (INDEPENDENT_AMBULATORY_CARE_PROVIDER_SITE_OTHER): Payer: Medicare Other | Admitting: General Practice

## 2020-02-15 DIAGNOSIS — I4891 Unspecified atrial fibrillation: Secondary | ICD-10-CM

## 2020-02-15 DIAGNOSIS — Z7901 Long term (current) use of anticoagulants: Secondary | ICD-10-CM

## 2020-02-15 LAB — POCT INR: INR: 2.6 (ref 2.0–3.0)

## 2020-02-15 NOTE — Patient Instructions (Addendum)
Pre visit review using our clinic review tool, if applicable. No additional management support is needed unless otherwise documented below in the visit note.  Continue to take 1 tablet daily except 1/2 tablet on Mon Wed and Fridays.   Re-check in 4 weeks.

## 2020-02-15 NOTE — Progress Notes (Signed)
I have reviewed the results and agree with this plan   

## 2020-02-16 DIAGNOSIS — N186 End stage renal disease: Secondary | ICD-10-CM | POA: Diagnosis not present

## 2020-02-16 DIAGNOSIS — R197 Diarrhea, unspecified: Secondary | ICD-10-CM | POA: Diagnosis not present

## 2020-02-16 DIAGNOSIS — E876 Hypokalemia: Secondary | ICD-10-CM | POA: Diagnosis not present

## 2020-02-16 DIAGNOSIS — D509 Iron deficiency anemia, unspecified: Secondary | ICD-10-CM | POA: Diagnosis not present

## 2020-02-16 DIAGNOSIS — L299 Pruritus, unspecified: Secondary | ICD-10-CM | POA: Diagnosis not present

## 2020-02-16 DIAGNOSIS — Z992 Dependence on renal dialysis: Secondary | ICD-10-CM | POA: Diagnosis not present

## 2020-02-16 DIAGNOSIS — R52 Pain, unspecified: Secondary | ICD-10-CM | POA: Diagnosis not present

## 2020-02-16 DIAGNOSIS — N2581 Secondary hyperparathyroidism of renal origin: Secondary | ICD-10-CM | POA: Diagnosis not present

## 2020-02-18 DIAGNOSIS — Z992 Dependence on renal dialysis: Secondary | ICD-10-CM | POA: Diagnosis not present

## 2020-02-18 DIAGNOSIS — R197 Diarrhea, unspecified: Secondary | ICD-10-CM | POA: Diagnosis not present

## 2020-02-18 DIAGNOSIS — D509 Iron deficiency anemia, unspecified: Secondary | ICD-10-CM | POA: Diagnosis not present

## 2020-02-18 DIAGNOSIS — L299 Pruritus, unspecified: Secondary | ICD-10-CM | POA: Diagnosis not present

## 2020-02-18 DIAGNOSIS — N2581 Secondary hyperparathyroidism of renal origin: Secondary | ICD-10-CM | POA: Diagnosis not present

## 2020-02-18 DIAGNOSIS — R52 Pain, unspecified: Secondary | ICD-10-CM | POA: Diagnosis not present

## 2020-02-18 DIAGNOSIS — N186 End stage renal disease: Secondary | ICD-10-CM | POA: Diagnosis not present

## 2020-02-18 DIAGNOSIS — E876 Hypokalemia: Secondary | ICD-10-CM | POA: Diagnosis not present

## 2020-02-21 DIAGNOSIS — R52 Pain, unspecified: Secondary | ICD-10-CM | POA: Diagnosis not present

## 2020-02-21 DIAGNOSIS — L299 Pruritus, unspecified: Secondary | ICD-10-CM | POA: Diagnosis not present

## 2020-02-21 DIAGNOSIS — N186 End stage renal disease: Secondary | ICD-10-CM | POA: Diagnosis not present

## 2020-02-21 DIAGNOSIS — N2581 Secondary hyperparathyroidism of renal origin: Secondary | ICD-10-CM | POA: Diagnosis not present

## 2020-02-21 DIAGNOSIS — E876 Hypokalemia: Secondary | ICD-10-CM | POA: Diagnosis not present

## 2020-02-21 DIAGNOSIS — D509 Iron deficiency anemia, unspecified: Secondary | ICD-10-CM | POA: Diagnosis not present

## 2020-02-21 DIAGNOSIS — Z992 Dependence on renal dialysis: Secondary | ICD-10-CM | POA: Diagnosis not present

## 2020-02-21 DIAGNOSIS — R197 Diarrhea, unspecified: Secondary | ICD-10-CM | POA: Diagnosis not present

## 2020-02-23 DIAGNOSIS — D509 Iron deficiency anemia, unspecified: Secondary | ICD-10-CM | POA: Diagnosis not present

## 2020-02-23 DIAGNOSIS — N2581 Secondary hyperparathyroidism of renal origin: Secondary | ICD-10-CM | POA: Diagnosis not present

## 2020-02-23 DIAGNOSIS — E876 Hypokalemia: Secondary | ICD-10-CM | POA: Diagnosis not present

## 2020-02-23 DIAGNOSIS — R52 Pain, unspecified: Secondary | ICD-10-CM | POA: Diagnosis not present

## 2020-02-23 DIAGNOSIS — N186 End stage renal disease: Secondary | ICD-10-CM | POA: Diagnosis not present

## 2020-02-23 DIAGNOSIS — L299 Pruritus, unspecified: Secondary | ICD-10-CM | POA: Diagnosis not present

## 2020-02-23 DIAGNOSIS — R197 Diarrhea, unspecified: Secondary | ICD-10-CM | POA: Diagnosis not present

## 2020-02-23 DIAGNOSIS — Z992 Dependence on renal dialysis: Secondary | ICD-10-CM | POA: Diagnosis not present

## 2020-02-25 DIAGNOSIS — L299 Pruritus, unspecified: Secondary | ICD-10-CM | POA: Diagnosis not present

## 2020-02-25 DIAGNOSIS — Z992 Dependence on renal dialysis: Secondary | ICD-10-CM | POA: Diagnosis not present

## 2020-02-25 DIAGNOSIS — D509 Iron deficiency anemia, unspecified: Secondary | ICD-10-CM | POA: Diagnosis not present

## 2020-02-25 DIAGNOSIS — N186 End stage renal disease: Secondary | ICD-10-CM | POA: Diagnosis not present

## 2020-02-25 DIAGNOSIS — R197 Diarrhea, unspecified: Secondary | ICD-10-CM | POA: Diagnosis not present

## 2020-02-25 DIAGNOSIS — E876 Hypokalemia: Secondary | ICD-10-CM | POA: Diagnosis not present

## 2020-02-25 DIAGNOSIS — R52 Pain, unspecified: Secondary | ICD-10-CM | POA: Diagnosis not present

## 2020-02-25 DIAGNOSIS — N2581 Secondary hyperparathyroidism of renal origin: Secondary | ICD-10-CM | POA: Diagnosis not present

## 2020-02-27 ENCOUNTER — Other Ambulatory Visit: Payer: Self-pay

## 2020-02-27 ENCOUNTER — Inpatient Hospital Stay: Payer: Medicare Other

## 2020-02-27 ENCOUNTER — Inpatient Hospital Stay: Payer: Medicare Other | Attending: Oncology | Admitting: Oncology

## 2020-02-27 VITALS — BP 105/51 | HR 63 | Temp 97.9°F | Resp 16 | Ht 68.0 in | Wt 147.2 lb

## 2020-02-27 DIAGNOSIS — R152 Fecal urgency: Secondary | ICD-10-CM | POA: Insufficient documentation

## 2020-02-27 DIAGNOSIS — M109 Gout, unspecified: Secondary | ICD-10-CM | POA: Diagnosis not present

## 2020-02-27 DIAGNOSIS — Z992 Dependence on renal dialysis: Secondary | ICD-10-CM | POA: Diagnosis not present

## 2020-02-27 DIAGNOSIS — R159 Full incontinence of feces: Secondary | ICD-10-CM | POA: Diagnosis not present

## 2020-02-27 DIAGNOSIS — I129 Hypertensive chronic kidney disease with stage 1 through stage 4 chronic kidney disease, or unspecified chronic kidney disease: Secondary | ICD-10-CM | POA: Insufficient documentation

## 2020-02-27 DIAGNOSIS — N189 Chronic kidney disease, unspecified: Secondary | ICD-10-CM | POA: Insufficient documentation

## 2020-02-27 DIAGNOSIS — C20 Malignant neoplasm of rectum: Secondary | ICD-10-CM

## 2020-02-27 DIAGNOSIS — Z8673 Personal history of transient ischemic attack (TIA), and cerebral infarction without residual deficits: Secondary | ICD-10-CM | POA: Insufficient documentation

## 2020-02-27 DIAGNOSIS — Z85048 Personal history of other malignant neoplasm of rectum, rectosigmoid junction, and anus: Secondary | ICD-10-CM | POA: Insufficient documentation

## 2020-02-27 NOTE — Progress Notes (Signed)
[facility] OFFICE PROGRESS NOTE   Diagnosis: Rectal cancer  INTERVAL HISTORY:   [name] returns for a scheduled visit.  He continues 3 day/week hemodialysis.  He reports tolerating the dialysis well.  No complaint.  Objective:  Vital signs in last 24 hours:  Blood pressure (!) 105/51, pulse 63, temperature 97.9 F (36.6 C), temperature source Tympanic, resp. rate 16, height 5\' 8"  (1.727 m), weight 147 lb 3.2 oz (66.8 kg), SpO2 100 %.   Limited physical examination secondary to distancing with the Covid pandemic. Lymphatics: No cervical, supraclavicular, axillary, or inguinal nodes GI: No hepatosplenomegaly, no mass, nontender Vascular: Left arm dialysis fistula with a palpable thrill     Lab Results:  Lab Results  Component Value Date   WBC 6.4 04/08/2019   HGB 13.6 11/22/2019   HCT 40.0 11/22/2019   MCV 97.2 04/08/2019   PLT 179 04/08/2019   NEUTROABS 4.2 04/08/2019    CMP  Lab Results  Component Value Date   NA 129 (L) 11/22/2019   K 3.3 (L) 11/22/2019   CL 86 (L) 11/22/2019   CO2 25 04/08/2019   GLUCOSE 78 11/22/2019   BUN 27 (H) 11/22/2019   CREATININE 6.50 (H) 11/22/2019   CALCIUM 10.5 (H) 04/08/2019   PROT 7.3 02/26/2019   ALBUMIN 3.8 02/26/2019   AST 19 02/26/2019   ALT 14 02/26/2019   ALKPHOS 104 02/26/2019   BILITOT 0.8 02/26/2019   GFRNONAA 7 (L) 04/08/2019   GFRAA 8 (L) 04/08/2019    Lab Results  Component Value Date   CEA1 1.72 08/29/2019    Medications: I have reviewed the patient's current medications.   Assessment/Plan: 1. Rectal cancer, clinical stage III (uT3, uN2).  Initiation of concurrent Xeloda and radiation on 02/21/2013, completed 03/31/2013.   CEA normal 04/25/2013   Status post laparoscopic low anterior resection with diverting loop ileostomy 05/23/2013. Final pathology showed a 3.2 cm invasive adenocarcinoma with neoadjuvant related change. Tumor invaded into perirectal soft tissue. There was no  lymphovascular or perineural invasion. 14 lymph nodes were negative for tumor. Surgical margins were negative. There was one hyperplastic polyp (ypT3, pN0).   Initiation of weekly 5-FU/leucovorin 08/01/2013   Ileostomy takedown 08/24/2013.   Weekly 5-FU/leucovorin resumed 09/22/2013.  Restaging CTs 02/20/2014 negative for evidence of recurrent disease  restaging CT scans 04/05/2015 negative for evidence of recurrent disease  Surveillance colonoscopy 03/31/2014, status post removal of of a tubular adenoma from the ascending colon  Enlarging left posterior medial upper lobe nodule on chest CT 04/05/2015 and 07/12/2015  PET scan 10/22/2015 with a hypermetabolic nodule in the medialaspect of the leftupper lobe, other lung nodules stable  Left lung wedge resection 11/20/2015 confirmed a 1 cm focus of metastatic adenocarcinoma of colorectal origin, resection margins and multiple lymph nodes negative  CT scans 03/17/2016-bandlike scarring along the left upper lobe nodule resection with a soft tissue density component; calcified bilateral pleural plaques; no findings of recurrence of the abdomen or pelvis.  CTs 09/30/2016-negative for progressive disease  CTs 06/05/2017-new pulmonary nodule right upper lobe. Stable pleural nodularity within the left and right lung. Stable nodular thickening along the left upper lobe resection margin. No evidence of metastasis in the abdomen/pelvis. Interval enlargement of a solid lesion in the medial aspect of the right kidney most consistent with a benign lesion based on a previous PET scan, the "new"right upper lobe nodule was present on a PET scan 10/22/2015 and similar in size-favored to be benign  CTs 12/01/2017-no evidence  of progressive rectal cancer, stable clustered right upper lobe pulmonary nodules, no new nodules  CTs 08/11/2018- no evidence of metastatic disease in the chest, abdomen or pelvis.  Circumferential bladder wall thickening with  subtle perivesicular edema, new in the interval.  Index nodule right upper lobe and soft tissue fullness associated with the inferior aspect of the left upper lobe staple line are stable.  Multiple bilateral renal lesions of varying size and attenuation, some compatible simple cyst while others are likely cyst complicated by proteinaceous debris or hemorrhage.  Some of the intermediate attenuation lesions although stable in size cannot be fully characterized.  Avascular necrosis left femoral head.   Colonoscopy 11/22/2019-rectal stricture, 12 mm polyp in the mid ascending colon-removed, tubular adenoma  2. Markedly elevated pretreatment CEA 3. Indeterminate pulmonary/subpleural nodules and right hepatic dome lesion. Unchanged on the CT 04/25/2013, unchanged nodular lung lesions on the CT 02/20/2014, see follow-up CTs/PET above 4. Renal insufficiency. Acute on chronic renal failure during hospitalization November/December 2014. Improved following the ileostomy takedown.He is followed by nephrology. 5. History of Anemia secondary chemotherapy, chronic disease, and renal insufficiency 6. Hypertension. 7. Gout. 8. Prolonged postoperative ileus following laparoscopic low anterior resection with diverting loop ileostomy 05/23/2013. 9. C. difficile-positive 09/08/2013-status post Flagyl with improvement. Recurrent diarrhea following completion of Flagyl. Stool positive for C. difficile on 09/24/2013. He completed treatment with vancomycin. The diarrhea resolved. 10. Left greater than right low leg edema. Venous Doppler 09/26/2013 negative for left leg DVT. 11. Rectal urgency/incontinence-Improved with participation in the pelvic physical therapy clinic. 12. CVA 06/08/2018 presenting with left-sided weakness and severely dysarthric speech.  Stroke felt to be due to large vessel atherosclerosis.    Disposition: [name] remains in clinical remission from rectal cancer.  We will follow up on the CEA  from today.  He was supposed to have restaging CTs earlier this year, but these were not scheduled.  He will be scheduled for CTs of the chest, abdomen, and pelvis for within the next 2 weeks.  [name] will return for an office visit in 6 months.  Betsy Coder, MD  02/27/2020  3:21 PM

## 2020-02-28 ENCOUNTER — Telehealth: Payer: Self-pay | Admitting: Oncology

## 2020-02-28 ENCOUNTER — Other Ambulatory Visit: Payer: Self-pay | Admitting: Internal Medicine

## 2020-02-28 DIAGNOSIS — Z992 Dependence on renal dialysis: Secondary | ICD-10-CM | POA: Diagnosis not present

## 2020-02-28 DIAGNOSIS — D509 Iron deficiency anemia, unspecified: Secondary | ICD-10-CM | POA: Diagnosis not present

## 2020-02-28 DIAGNOSIS — R197 Diarrhea, unspecified: Secondary | ICD-10-CM | POA: Diagnosis not present

## 2020-02-28 DIAGNOSIS — L299 Pruritus, unspecified: Secondary | ICD-10-CM | POA: Diagnosis not present

## 2020-02-28 DIAGNOSIS — R52 Pain, unspecified: Secondary | ICD-10-CM | POA: Diagnosis not present

## 2020-02-28 DIAGNOSIS — E876 Hypokalemia: Secondary | ICD-10-CM | POA: Diagnosis not present

## 2020-02-28 DIAGNOSIS — N186 End stage renal disease: Secondary | ICD-10-CM | POA: Diagnosis not present

## 2020-02-28 DIAGNOSIS — N2581 Secondary hyperparathyroidism of renal origin: Secondary | ICD-10-CM | POA: Diagnosis not present

## 2020-02-28 LAB — CEA (IN HOUSE-CHCC): CEA (CHCC-In House): 1.59 ng/mL (ref 0.00–5.00)

## 2020-02-28 NOTE — Telephone Encounter (Signed)
Scheduled appointment per 8/23 los. Patient is aware of appointment date and time.

## 2020-03-01 DIAGNOSIS — D509 Iron deficiency anemia, unspecified: Secondary | ICD-10-CM | POA: Diagnosis not present

## 2020-03-01 DIAGNOSIS — R52 Pain, unspecified: Secondary | ICD-10-CM | POA: Diagnosis not present

## 2020-03-01 DIAGNOSIS — L299 Pruritus, unspecified: Secondary | ICD-10-CM | POA: Diagnosis not present

## 2020-03-01 DIAGNOSIS — N186 End stage renal disease: Secondary | ICD-10-CM | POA: Diagnosis not present

## 2020-03-01 DIAGNOSIS — E876 Hypokalemia: Secondary | ICD-10-CM | POA: Diagnosis not present

## 2020-03-01 DIAGNOSIS — T782XXA Anaphylactic shock, unspecified, initial encounter: Secondary | ICD-10-CM | POA: Insufficient documentation

## 2020-03-01 DIAGNOSIS — R197 Diarrhea, unspecified: Secondary | ICD-10-CM | POA: Diagnosis not present

## 2020-03-01 DIAGNOSIS — N2581 Secondary hyperparathyroidism of renal origin: Secondary | ICD-10-CM | POA: Diagnosis not present

## 2020-03-01 DIAGNOSIS — Z992 Dependence on renal dialysis: Secondary | ICD-10-CM | POA: Diagnosis not present

## 2020-03-01 DIAGNOSIS — T7840XA Allergy, unspecified, initial encounter: Secondary | ICD-10-CM | POA: Insufficient documentation

## 2020-03-03 DIAGNOSIS — N2581 Secondary hyperparathyroidism of renal origin: Secondary | ICD-10-CM | POA: Diagnosis not present

## 2020-03-03 DIAGNOSIS — L299 Pruritus, unspecified: Secondary | ICD-10-CM | POA: Diagnosis not present

## 2020-03-03 DIAGNOSIS — D509 Iron deficiency anemia, unspecified: Secondary | ICD-10-CM | POA: Diagnosis not present

## 2020-03-03 DIAGNOSIS — N186 End stage renal disease: Secondary | ICD-10-CM | POA: Diagnosis not present

## 2020-03-03 DIAGNOSIS — R52 Pain, unspecified: Secondary | ICD-10-CM | POA: Diagnosis not present

## 2020-03-03 DIAGNOSIS — R197 Diarrhea, unspecified: Secondary | ICD-10-CM | POA: Diagnosis not present

## 2020-03-03 DIAGNOSIS — Z992 Dependence on renal dialysis: Secondary | ICD-10-CM | POA: Diagnosis not present

## 2020-03-03 DIAGNOSIS — E876 Hypokalemia: Secondary | ICD-10-CM | POA: Diagnosis not present

## 2020-03-06 DIAGNOSIS — E876 Hypokalemia: Secondary | ICD-10-CM | POA: Diagnosis not present

## 2020-03-06 DIAGNOSIS — R52 Pain, unspecified: Secondary | ICD-10-CM | POA: Diagnosis not present

## 2020-03-06 DIAGNOSIS — R197 Diarrhea, unspecified: Secondary | ICD-10-CM | POA: Diagnosis not present

## 2020-03-06 DIAGNOSIS — Z992 Dependence on renal dialysis: Secondary | ICD-10-CM | POA: Diagnosis not present

## 2020-03-06 DIAGNOSIS — I10 Essential (primary) hypertension: Secondary | ICD-10-CM | POA: Diagnosis not present

## 2020-03-06 DIAGNOSIS — I129 Hypertensive chronic kidney disease with stage 1 through stage 4 chronic kidney disease, or unspecified chronic kidney disease: Secondary | ICD-10-CM | POA: Diagnosis not present

## 2020-03-06 DIAGNOSIS — N2581 Secondary hyperparathyroidism of renal origin: Secondary | ICD-10-CM | POA: Diagnosis not present

## 2020-03-06 DIAGNOSIS — L299 Pruritus, unspecified: Secondary | ICD-10-CM | POA: Diagnosis not present

## 2020-03-06 DIAGNOSIS — D509 Iron deficiency anemia, unspecified: Secondary | ICD-10-CM | POA: Diagnosis not present

## 2020-03-06 DIAGNOSIS — N186 End stage renal disease: Secondary | ICD-10-CM | POA: Diagnosis not present

## 2020-03-08 DIAGNOSIS — R197 Diarrhea, unspecified: Secondary | ICD-10-CM | POA: Diagnosis not present

## 2020-03-08 DIAGNOSIS — Z992 Dependence on renal dialysis: Secondary | ICD-10-CM | POA: Diagnosis not present

## 2020-03-08 DIAGNOSIS — R52 Pain, unspecified: Secondary | ICD-10-CM | POA: Diagnosis not present

## 2020-03-08 DIAGNOSIS — D509 Iron deficiency anemia, unspecified: Secondary | ICD-10-CM | POA: Diagnosis not present

## 2020-03-08 DIAGNOSIS — N186 End stage renal disease: Secondary | ICD-10-CM | POA: Diagnosis not present

## 2020-03-08 DIAGNOSIS — D631 Anemia in chronic kidney disease: Secondary | ICD-10-CM | POA: Diagnosis not present

## 2020-03-08 DIAGNOSIS — Z23 Encounter for immunization: Secondary | ICD-10-CM | POA: Diagnosis not present

## 2020-03-08 DIAGNOSIS — N2581 Secondary hyperparathyroidism of renal origin: Secondary | ICD-10-CM | POA: Diagnosis not present

## 2020-03-08 DIAGNOSIS — E876 Hypokalemia: Secondary | ICD-10-CM | POA: Diagnosis not present

## 2020-03-10 DIAGNOSIS — R52 Pain, unspecified: Secondary | ICD-10-CM | POA: Diagnosis not present

## 2020-03-10 DIAGNOSIS — N186 End stage renal disease: Secondary | ICD-10-CM | POA: Diagnosis not present

## 2020-03-10 DIAGNOSIS — E876 Hypokalemia: Secondary | ICD-10-CM | POA: Diagnosis not present

## 2020-03-10 DIAGNOSIS — R197 Diarrhea, unspecified: Secondary | ICD-10-CM | POA: Diagnosis not present

## 2020-03-10 DIAGNOSIS — D509 Iron deficiency anemia, unspecified: Secondary | ICD-10-CM | POA: Diagnosis not present

## 2020-03-10 DIAGNOSIS — N2581 Secondary hyperparathyroidism of renal origin: Secondary | ICD-10-CM | POA: Diagnosis not present

## 2020-03-10 DIAGNOSIS — D631 Anemia in chronic kidney disease: Secondary | ICD-10-CM | POA: Diagnosis not present

## 2020-03-10 DIAGNOSIS — Z992 Dependence on renal dialysis: Secondary | ICD-10-CM | POA: Diagnosis not present

## 2020-03-10 DIAGNOSIS — Z23 Encounter for immunization: Secondary | ICD-10-CM | POA: Diagnosis not present

## 2020-03-13 DIAGNOSIS — E876 Hypokalemia: Secondary | ICD-10-CM | POA: Diagnosis not present

## 2020-03-13 DIAGNOSIS — N186 End stage renal disease: Secondary | ICD-10-CM | POA: Diagnosis not present

## 2020-03-13 DIAGNOSIS — Z23 Encounter for immunization: Secondary | ICD-10-CM | POA: Diagnosis not present

## 2020-03-13 DIAGNOSIS — D631 Anemia in chronic kidney disease: Secondary | ICD-10-CM | POA: Diagnosis not present

## 2020-03-13 DIAGNOSIS — R197 Diarrhea, unspecified: Secondary | ICD-10-CM | POA: Diagnosis not present

## 2020-03-13 DIAGNOSIS — N2581 Secondary hyperparathyroidism of renal origin: Secondary | ICD-10-CM | POA: Diagnosis not present

## 2020-03-13 DIAGNOSIS — D509 Iron deficiency anemia, unspecified: Secondary | ICD-10-CM | POA: Diagnosis not present

## 2020-03-13 DIAGNOSIS — R52 Pain, unspecified: Secondary | ICD-10-CM | POA: Diagnosis not present

## 2020-03-13 DIAGNOSIS — Z992 Dependence on renal dialysis: Secondary | ICD-10-CM | POA: Diagnosis not present

## 2020-03-14 ENCOUNTER — Ambulatory Visit (HOSPITAL_COMMUNITY)
Admission: RE | Admit: 2020-03-14 | Discharge: 2020-03-14 | Disposition: A | Discharge status: Home / Self Care | Payer: Medicare Other | Source: Ambulatory Visit | Attending: Oncology | Admitting: Oncology

## 2020-03-14 ENCOUNTER — Other Ambulatory Visit: Payer: Self-pay

## 2020-03-14 DIAGNOSIS — C20 Malignant neoplasm of rectum: Secondary | ICD-10-CM | POA: Insufficient documentation

## 2020-03-14 DIAGNOSIS — I700 Atherosclerosis of aorta: Secondary | ICD-10-CM | POA: Diagnosis not present

## 2020-03-14 DIAGNOSIS — C189 Malignant neoplasm of colon, unspecified: Secondary | ICD-10-CM | POA: Diagnosis not present

## 2020-03-14 DIAGNOSIS — K5730 Diverticulosis of large intestine without perforation or abscess without bleeding: Secondary | ICD-10-CM | POA: Diagnosis not present

## 2020-03-14 DIAGNOSIS — I2510 Atherosclerotic heart disease of native coronary artery without angina pectoris: Secondary | ICD-10-CM | POA: Diagnosis not present

## 2020-03-14 MED ORDER — IOHEXOL 9 MG/ML PO SOLN: 500.0000 mL | ORAL | Status: AC

## 2020-03-14 MED ORDER — IOHEXOL 9 MG/ML PO SOLN
ORAL | Status: AC
  Administered 2020-03-14: 1000 mL
  Filled 2020-03-14: qty 1000

## 2020-03-15 ENCOUNTER — Telehealth: Payer: Self-pay | Admitting: Internal Medicine

## 2020-03-15 DIAGNOSIS — E876 Hypokalemia: Secondary | ICD-10-CM | POA: Diagnosis not present

## 2020-03-15 DIAGNOSIS — D631 Anemia in chronic kidney disease: Secondary | ICD-10-CM | POA: Diagnosis not present

## 2020-03-15 DIAGNOSIS — N186 End stage renal disease: Secondary | ICD-10-CM | POA: Diagnosis not present

## 2020-03-15 DIAGNOSIS — D509 Iron deficiency anemia, unspecified: Secondary | ICD-10-CM | POA: Diagnosis not present

## 2020-03-15 DIAGNOSIS — R197 Diarrhea, unspecified: Secondary | ICD-10-CM | POA: Diagnosis not present

## 2020-03-15 DIAGNOSIS — Z23 Encounter for immunization: Secondary | ICD-10-CM | POA: Diagnosis not present

## 2020-03-15 DIAGNOSIS — R52 Pain, unspecified: Secondary | ICD-10-CM | POA: Diagnosis not present

## 2020-03-15 DIAGNOSIS — Z992 Dependence on renal dialysis: Secondary | ICD-10-CM | POA: Diagnosis not present

## 2020-03-15 DIAGNOSIS — N2581 Secondary hyperparathyroidism of renal origin: Secondary | ICD-10-CM | POA: Diagnosis not present

## 2020-03-15 NOTE — Telephone Encounter (Signed)
Patient called and was requesting a refill on a cold medication. I cannot find it in his chart. Patient said that Dr. Ronnald Ramp prescribed it to him back in 2017 for a cold

## 2020-03-16 ENCOUNTER — Other Ambulatory Visit: Payer: Self-pay | Admitting: Internal Medicine

## 2020-03-16 DIAGNOSIS — J069 Acute upper respiratory infection, unspecified: Secondary | ICD-10-CM

## 2020-03-16 IMAGING — CT CT ABD-PELV W/O CM
2 of 4 series · 12 of 36 positions shown, 15 images · non-contrast
Comparison: 06/05/2017 CT chest, abdomen and pelvis.

CLINICAL DATA: Stage III rectal cancer status post concurrent
chemotherapy and radiation therapy completed 03/31/2013, status Rhoden
CHARLES KOFI 05/23/2013, left upper lobe wedge resection 11/20/2015 for lung
metastasis. Restaging.

EXAM:
CT CHEST, ABDOMEN AND PELVIS WITHOUT CONTRAST
TECHNIQUE: Multidetector CT imaging of the chest, abdomen and pelvis was
performed following the standard protocol without IV contrast.

[Series 2: cap w/o · axial · non-contrast · 0.92mm/px · z∈[-194,+320]mm · 9 of 125 slices shown, 12 images]
[im 11/125  mediastinal]
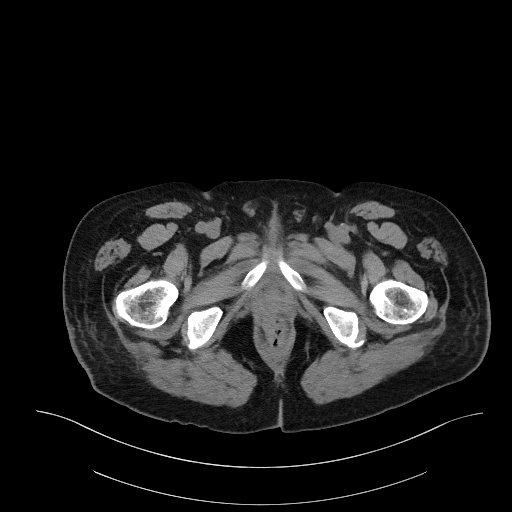
[im 11/125  lung]
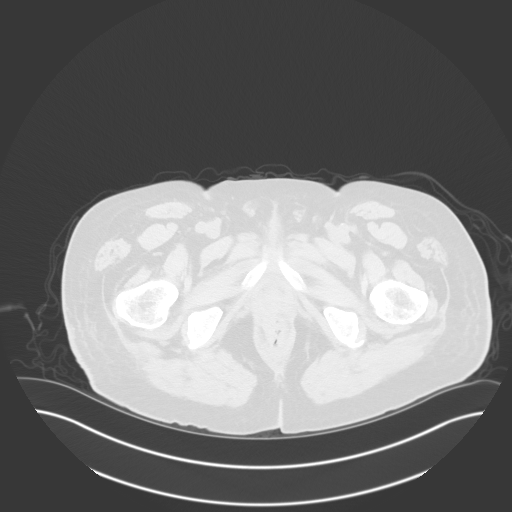
[im 21/125  lung]
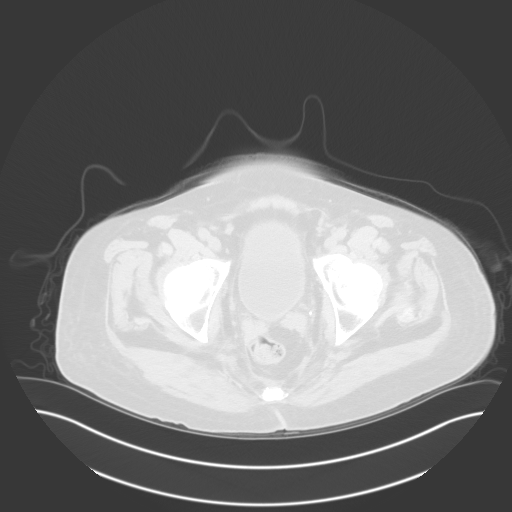
[im 42/125  lung]
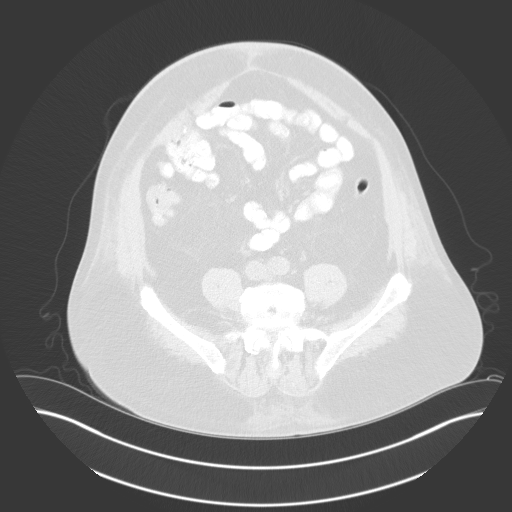
[im 52/125  lung]
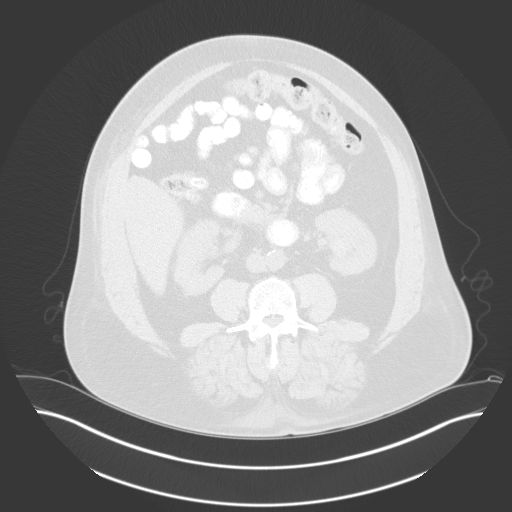
[im 63/125  mediastinal]
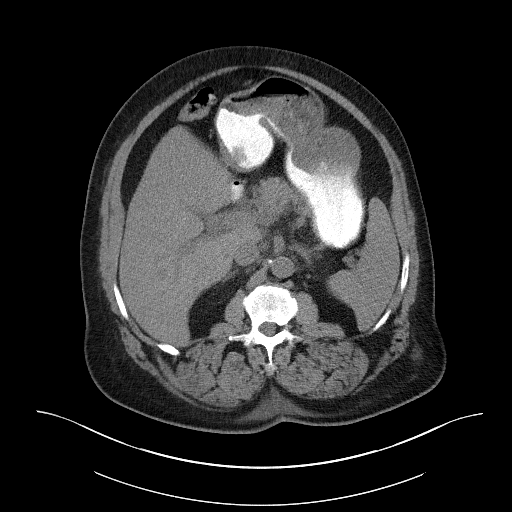
[im 63/125  lung]
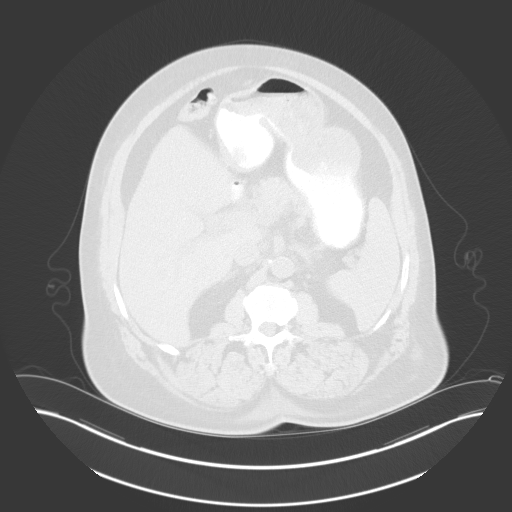
[im 73/125  lung]
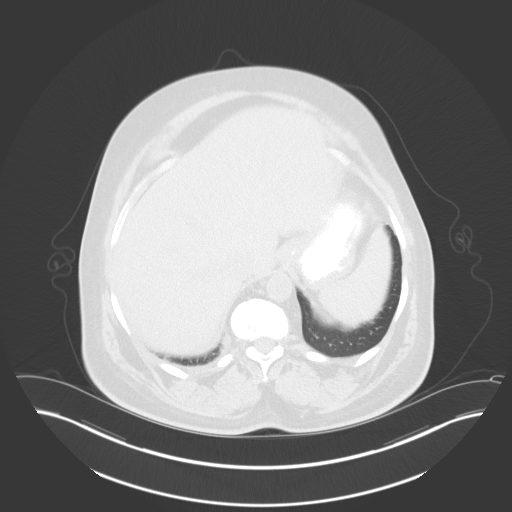
[im 83/125  lung]
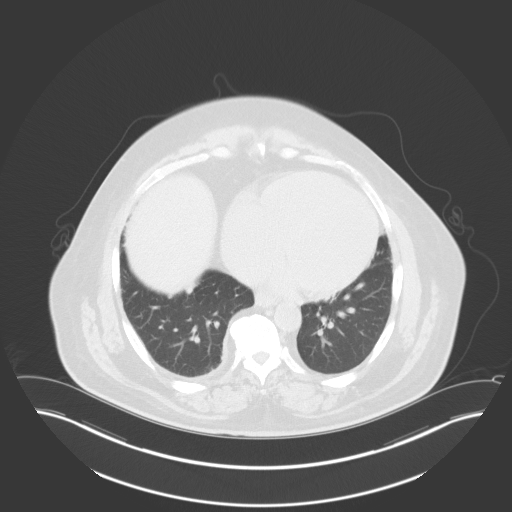
[im 104/125  lung]
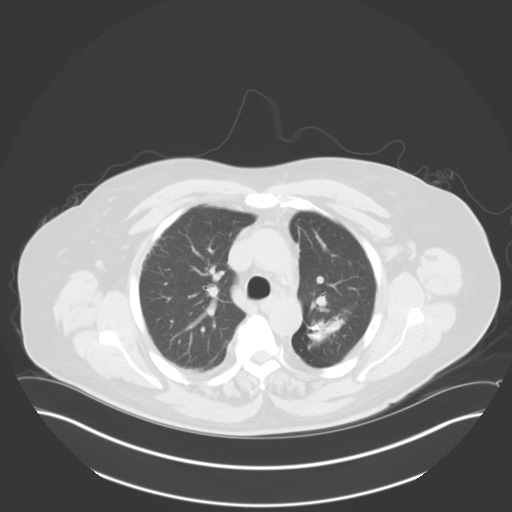
[im 114/125  mediastinal]
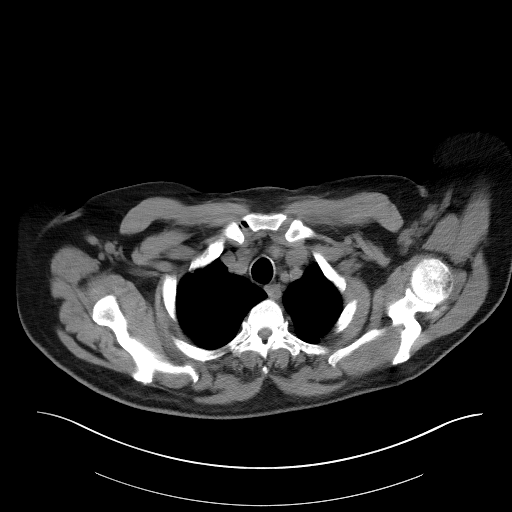
[im 114/125  lung]
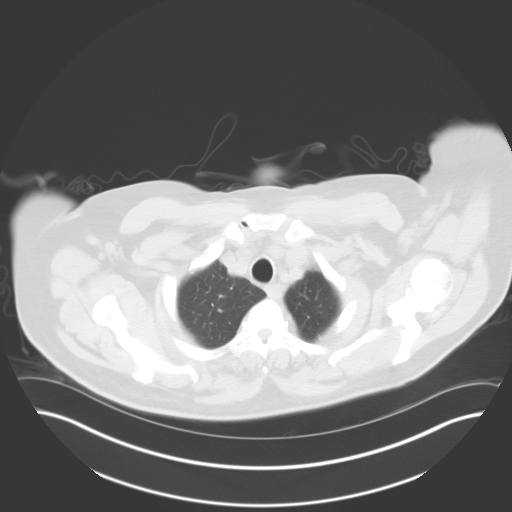

[Series 5: coronals · coronal · 0.78mm/px · 3 of 196 slices shown]
[im 40/196  lung]
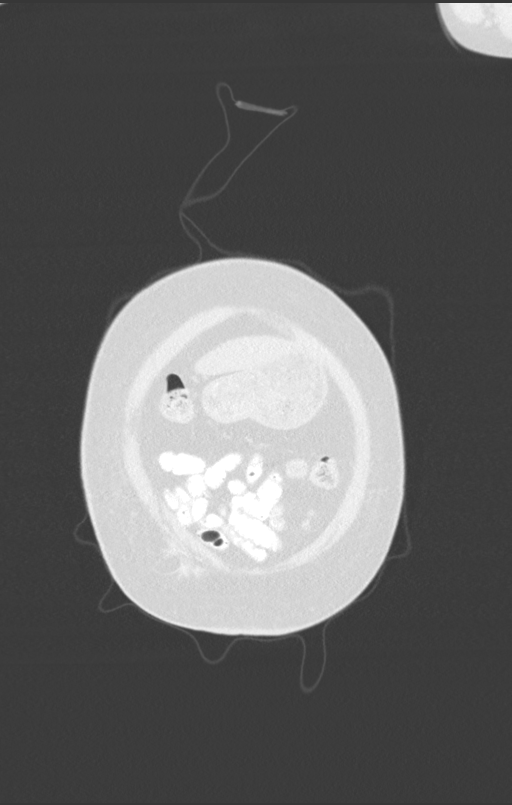
[im 79/196  lung]
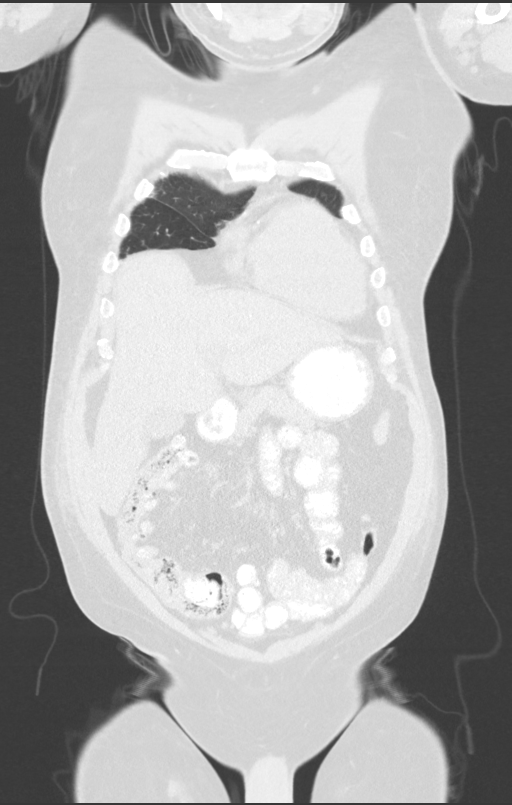
[im 118/196  lung]
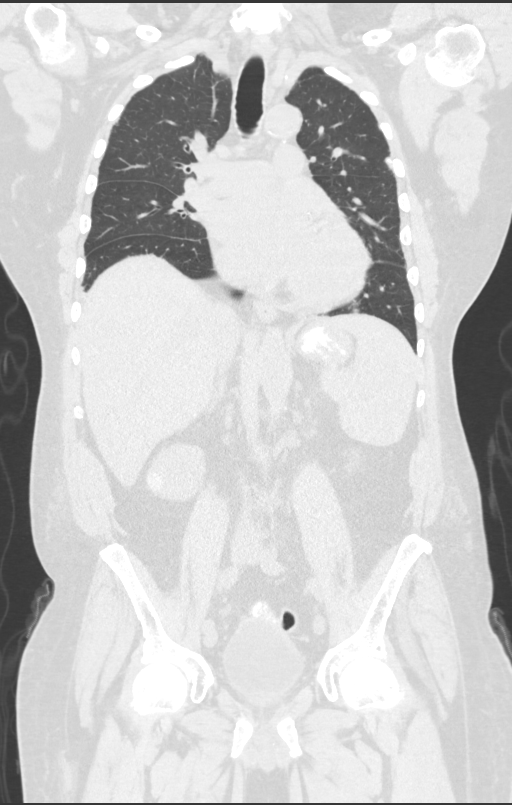

[12 of 36 positions shown; findings below may reference images not displayed]

FINDINGS: CT CHEST FINDINGS

Cardiovascular: Stable borderline mild cardiomegaly. No significant
pericardial effusion/thickening. Three-vessel coronary
atherosclerosis. Atherosclerotic nonaneurysmal thoracic aorta.
Normal caliber pulmonary arteries.

Mediastinum/Nodes: No discrete thyroid nodules. Unremarkable
esophagus. No pathologically enlarged axillary, mediastinal or hilar
lymph nodes, noting limited sensitivity for the detection of hilar
adenopathy on this noncontrast study.

Lungs/Pleura: No pneumothorax. No pleural effusion. Status post left
upper lobe wedge resection posteriorly. Stable thick bandlike soft
tissue along the wedge resection suture line measuring 1.1 cm
thickness (series 4/image 54), previously 1.1 cm. No acute
consolidative airspace disease or lung masses. Two clustered right
upper lobe solid pulmonary nodules, largest 9 mm (series 4/image
62), both stable since 06/05/2017. No new significant pulmonary
nodules. Stable faintly calcified bilateral pleural plaques. Mild
patchy subpleural reticulation throughout both lungs without
significant regions traction bronchiectasis or frank honeycombing,
not appreciably changed.

Musculoskeletal: No aggressive appearing focal osseous lesions.
Marked thoracic spondylosis.

CT ABDOMEN PELVIS FINDINGS

Hepatobiliary: Normal liver with no liver mass. Normal gallbladder
with no radiopaque cholelithiasis. No biliary ductal dilatation.

Pancreas: Normal, with no mass or duct dilation.

Spleen: Normal size. No mass.

Adrenals/Urinary Tract: Normal adrenals. No renal stones. No
hydronephrosis. Simple 1.1 cm upper left renal cyst. There are
several stable renal cortical masses in the kidneys bilaterally with
indeterminate density, largest 4.1 x 3.5 cm in medial lower right
kidney (series 2/image 79) previously 4.2 x 3.8 cm, not appreciably
changed. No new contour deforming renal masses. Normal bladder.

Stomach/Bowel: Normal non-distended stomach. Normal caliber small
bowel with no small bowel wall thickening. Normal appendix. Stable
postsurgical changes low anterior resection with distal anastomosis.
Moderate diffuse diverticulosis in the remnant large bowel, with no
large bowel wall thickening or significant pericolonic stranding.

Vascular/Lymphatic: Atherosclerotic nonaneurysmal abdominal aorta.
Stable 1.1 cm portacaval node (series 2/image 67), considered normal
for this nodal station. No pathologically enlarged lymph nodes in
the abdomen or pelvis.

Reproductive: Mildly enlarged prostate, unchanged.

Other: No pneumoperitoneum, ascites or focal fluid collection.
Stable small fat containing umbilical hernia. Stable small fat
containing hernia in the ventral right abdominal wall at the prior
diverting ileostomy site (series 2/image 80).

Musculoskeletal: No aggressive appearing focal osseous lesions.
Moderate lumbar spondylosis.
IMPRESSION: 1. No evidence of recurrent metastatic disease in the chest. Right
upper lobe solid pulmonary nodules are stable and probably benign.
Stable bandlike scarring along the posterior left upper lobe wedge
resection suture line.
2. No evidence of metastatic disease in the abdomen or pelvis.
3. Several renal cortical masses with indeterminate density in the
kidneys bilaterally, all stable in size. While reassuring, indolent
renal cell carcinoma remains on the differential. MRI (preferred) or
CT abdomen without and with IV contrast may be obtained for further
characterization as clinically warranted.
4. Chronic findings include: Aortic Atherosclerosis ([id number]).
Three-vessel coronary atherosclerosis. Bilateral calcified pleural
plaques without pleural effusions. Moderate colonic diverticulosis.
Mild prostatomegaly.

## 2020-03-16 MED ORDER — DEXTROMETHORPHAN POLISTIREX ER 30 MG/5ML PO SUER: 30.0000 mg | Freq: Two times a day (BID) | ORAL | 1 refills | Status: DC

## 2020-03-17 DIAGNOSIS — Z23 Encounter for immunization: Secondary | ICD-10-CM | POA: Diagnosis not present

## 2020-03-17 DIAGNOSIS — N2581 Secondary hyperparathyroidism of renal origin: Secondary | ICD-10-CM | POA: Diagnosis not present

## 2020-03-17 DIAGNOSIS — R197 Diarrhea, unspecified: Secondary | ICD-10-CM | POA: Diagnosis not present

## 2020-03-17 DIAGNOSIS — Z992 Dependence on renal dialysis: Secondary | ICD-10-CM | POA: Diagnosis not present

## 2020-03-17 DIAGNOSIS — D631 Anemia in chronic kidney disease: Secondary | ICD-10-CM | POA: Diagnosis not present

## 2020-03-17 DIAGNOSIS — R52 Pain, unspecified: Secondary | ICD-10-CM | POA: Diagnosis not present

## 2020-03-17 DIAGNOSIS — D509 Iron deficiency anemia, unspecified: Secondary | ICD-10-CM | POA: Diagnosis not present

## 2020-03-17 DIAGNOSIS — E876 Hypokalemia: Secondary | ICD-10-CM | POA: Diagnosis not present

## 2020-03-17 DIAGNOSIS — N186 End stage renal disease: Secondary | ICD-10-CM | POA: Diagnosis not present

## 2020-03-19 ENCOUNTER — Other Ambulatory Visit: Payer: Self-pay

## 2020-03-19 ENCOUNTER — Telehealth: Payer: Self-pay

## 2020-03-19 ENCOUNTER — Ambulatory Visit (INDEPENDENT_AMBULATORY_CARE_PROVIDER_SITE_OTHER): Payer: Medicare Other | Admitting: General Practice

## 2020-03-19 ENCOUNTER — Other Ambulatory Visit: Payer: Self-pay | Admitting: Cardiology

## 2020-03-19 DIAGNOSIS — I4891 Unspecified atrial fibrillation: Secondary | ICD-10-CM

## 2020-03-19 DIAGNOSIS — I5022 Chronic systolic (congestive) heart failure: Secondary | ICD-10-CM

## 2020-03-19 DIAGNOSIS — Z7901 Long term (current) use of anticoagulants: Secondary | ICD-10-CM

## 2020-03-19 LAB — POCT INR: INR: 1.7 — AB (ref 2.0–3.0)

## 2020-03-19 NOTE — Telephone Encounter (Signed)
Called home phone x 2  Cell phone x 2  wifes cell x 2   All to relay CT results and recommendations NO ANSWER  Unable to leave voice mail

## 2020-03-19 NOTE — Telephone Encounter (Signed)
-----   Message from Jason Pier, MD sent at 03/19/2020  7:35 AM EDT ----- Please call patient, CTs are negative for cancer, f/u as scheduled, some evidence of infection in rt. Lung , call for cough/fever, f/u as scheduled

## 2020-03-19 NOTE — Telephone Encounter (Signed)
Did make another attempt at reaching pt or designated family member was able to speak with pt directly made him aware of CT scan no new orders however I also made pt aware of new recommendation to call for any cough or evidence of infection I did also ask pt if he was having any kind of cough congestion or SOB/ does he feel winded or deep breathing more than normal? Does he hear any raspy sounds or wheezing when breathing/ I asked about congestion cold like sx/ clogged ears runny eyes frequent sneezing any pains in back or chest/ any NVD any HA not easily relieved if he felt dizzy pt denied all of the above and understands to call for any abnormality

## 2020-03-19 NOTE — Telephone Encounter (Signed)
-----   Message from [name], MD sent at 03/19/2020  7:35 AM EDT ----- Please call patient, CTs are negative for cancer, f/u as scheduled, some evidence of infection in rt. Lung , call for cough/fever, f/u as scheduled

## 2020-03-19 NOTE — Patient Instructions (Addendum)
Pre visit review using our clinic review tool, if applicable. No additional management support is needed unless otherwise documented below in the visit note.  Take 1 tablet today (9/13) and then continue to take 1 tablet daily except 1/2 tablet on Mon Wed and Fridays.   Re-check in 4 weeks.

## 2020-03-20 DIAGNOSIS — Z992 Dependence on renal dialysis: Secondary | ICD-10-CM | POA: Diagnosis not present

## 2020-03-20 DIAGNOSIS — N2581 Secondary hyperparathyroidism of renal origin: Secondary | ICD-10-CM | POA: Diagnosis not present

## 2020-03-20 DIAGNOSIS — N186 End stage renal disease: Secondary | ICD-10-CM | POA: Diagnosis not present

## 2020-03-20 DIAGNOSIS — Z23 Encounter for immunization: Secondary | ICD-10-CM | POA: Diagnosis not present

## 2020-03-20 DIAGNOSIS — R52 Pain, unspecified: Secondary | ICD-10-CM | POA: Diagnosis not present

## 2020-03-20 DIAGNOSIS — D631 Anemia in chronic kidney disease: Secondary | ICD-10-CM | POA: Diagnosis not present

## 2020-03-20 DIAGNOSIS — D509 Iron deficiency anemia, unspecified: Secondary | ICD-10-CM | POA: Diagnosis not present

## 2020-03-20 DIAGNOSIS — R197 Diarrhea, unspecified: Secondary | ICD-10-CM | POA: Diagnosis not present

## 2020-03-20 DIAGNOSIS — E876 Hypokalemia: Secondary | ICD-10-CM | POA: Diagnosis not present

## 2020-03-21 ENCOUNTER — Other Ambulatory Visit: Payer: Self-pay

## 2020-03-21 ENCOUNTER — Ambulatory Visit (INDEPENDENT_AMBULATORY_CARE_PROVIDER_SITE_OTHER): Payer: Medicare Other | Admitting: Podiatry

## 2020-03-21 ENCOUNTER — Encounter: Payer: Self-pay | Admitting: Podiatry

## 2020-03-21 DIAGNOSIS — B351 Tinea unguium: Secondary | ICD-10-CM | POA: Diagnosis not present

## 2020-03-21 DIAGNOSIS — G629 Polyneuropathy, unspecified: Secondary | ICD-10-CM | POA: Diagnosis not present

## 2020-03-21 DIAGNOSIS — L84 Corns and callosities: Secondary | ICD-10-CM

## 2020-03-21 DIAGNOSIS — M79675 Pain in left toe(s): Secondary | ICD-10-CM | POA: Diagnosis not present

## 2020-03-21 DIAGNOSIS — Z992 Dependence on renal dialysis: Secondary | ICD-10-CM | POA: Diagnosis not present

## 2020-03-21 DIAGNOSIS — G4733 Obstructive sleep apnea (adult) (pediatric): Secondary | ICD-10-CM | POA: Diagnosis not present

## 2020-03-21 DIAGNOSIS — N186 End stage renal disease: Secondary | ICD-10-CM

## 2020-03-21 DIAGNOSIS — M79674 Pain in right toe(s): Secondary | ICD-10-CM | POA: Diagnosis not present

## 2020-03-21 NOTE — Progress Notes (Signed)
Subjective:  Patient ID: Jason Cotta Sr., male    DOB: 11/04/1945,  MRN: [id number]  Jason Cotta Sr. presents to clinic today for at risk foot care. Patient has h/o ESRD on hemodialysis and painful callus(es) b/l heels and painful thick toenails that are difficult to trim. Pain interferes with ambulation. Aggravating factors include wearing enclosed shoe gear. Pain is relieved with periodic professional debridement..  74 y.o. male presents with the above complaint.  Reports painfully elongated nails to both feet. He is requesting new plantar fascial brace for left foot.  Review of Systems: Negative except as noted in the HPI. Past Medical History:  Diagnosis Date  . Anemia   . Arthritis   . Atrial fibrillation ([place])    on Coumadin   . Chronic renal insufficiency 07/31/2011  . COPD (chronic obstructive pulmonary disease) ([place])    pt denies  . CVA (cerebral vascular accident) ([place]) 06/2018  . Deficiency anemia 07/20/2012  . Diverticulosis   . Edema 09/21/2010   Qualifier: Diagnosis of  By: Jerold Coombe    . ERECTILE DYSFUNCTION, MILD 05/11/2007   Qualifier: Diagnosis of  By: Sherren Mocha MD, Jory Ee   . Exertional dyspnea 10/30/2015  . GOUT 05/11/2007   Qualifier: Diagnosis of  By: Sherren Mocha MD, Jory Ee   . Hereditary and idiopathic peripheral neuropathy 09/14/2014  . History of radiation therapy 02/21/13-03/31/13   rectum 50.4Gy total dose  . Hypersomnia 10/30/2015  . Hypertension   . Lung nodule 10/30/2015  . OAB (overactive bladder) 01/09/2016  . Obesity (BMI 30.0-34.9) 10/30/2015  . Obstructive sleep apnea 07/29/2010   HST 11/2015 AHI 54.  On autocpap  >> 10 cm   . Peripheral edema 09/19/2013  . rectal ca dx'd 02/01/13   rectal. Radiation and chemotherapy- remains on chemotherapy-next tx. 08-22-13.   . S/P partial lobectomy of lung 11/20/2015  . S/P thoracotomy   . Sleep apnea    wears cpap   . SUI (stress urinary incontinence), male 01/09/2016   Past Surgical History:  Procedure Laterality  Date  . A/V FISTULAGRAM Left 06/14/2018   Procedure: A/V FISTULAGRAM;  Surgeon: Waynetta Sandy, MD;  Location: [facility];  Service: Cardiovascular;  Laterality: Left;  . AV FISTULA PLACEMENT Left 03/24/2016   Procedure: CREATION OF LEFT BRACIOCEPHALIC ARTERIOVENOUS (AV) FISTULA;  Surgeon: Elam Dutch, MD;  Location: [facility];  Service: Vascular;  Laterality: Left;  . BIOPSY  09/17/2018   Procedure: BIOPSY;  Surgeon: Doran Stabler, MD;  Location: [facility];  Service: Gastroenterology;;  . BIOPSY  03/09/2019   Procedure: BIOPSY;  Surgeon: Doran Stabler, MD;  Location: [facility];  Service: Gastroenterology;;  . BIOPSY  11/22/2019   Procedure: BIOPSY;  Surgeon: Doran Stabler, MD;  Location: [facility];  Service: Gastroenterology;;  . COLON SURGERY  05/20/2013  . COLONOSCOPY  2010   [facility]  . COLONOSCOPY WITH PROPOFOL N/A 09/17/2018   Procedure: COLONOSCOPY WITH PROPOFOL;  Surgeon: Doran Stabler, MD;  Location: [facility];  Service: Gastroenterology;  Laterality: N/A;  . COLONOSCOPY WITH PROPOFOL N/A 11/22/2019   Procedure: COLONOSCOPY WITH PROPOFOL;  Surgeon: Doran Stabler, MD;  Location: [facility];  Service: Gastroenterology;  Laterality: N/A;  Hx of colon polyps tt   . ESOPHAGOGASTRODUODENOSCOPY (EGD) WITH PROPOFOL N/A 09/17/2018   Procedure: ESOPHAGOGASTRODUODENOSCOPY (EGD) WITH PROPOFOL;  Surgeon: Doran Stabler, MD;  [facility]: [facility];  Service: Gastroenterology;  Laterality: N/A;  .  ESOPHAGOGASTRODUODENOSCOPY (EGD) WITH PROPOFOL N/A 03/09/2019   Procedure: ESOPHAGOGASTRODUODENOSCOPY (EGD) WITH PROPOFOL;  Surgeon: Doran Stabler, MD;  [facility]: [facility];  Service: Gastroenterology;  Laterality: N/A;  . EUS N/A 02/03/2013   Procedure: LOWER ENDOSCOPIC ULTRASOUND (EUS);  Surgeon: Milus Banister, MD;  Location: [facility];  Service: Endoscopy;  Laterality: N/A;  . HEMOSTASIS CLIP PLACEMENT  11/22/2019   Procedure:  HEMOSTASIS CLIP PLACEMENT;  Surgeon: Doran Stabler, MD;  Location: [facility];  Service: Gastroenterology;;  . Lucianne Lei CLOSURE N/A 08/24/2013   Procedure: CLOSURE OF LOOP ILEOSTOMY ;  Surgeon: Leighton Ruff, MD;  Location: [facility];  Service: General;  Laterality: N/A;  . IR ANGIO VERTEBRAL SEL SUBCLAVIAN INNOMINATE BILAT MOD SED  06/08/2018  . IR CT HEAD LTD  06/08/2018  . IR INTRAVSC STENT CERV CAROTID W/O EMB-PROT MOD SED INC ANGIO  06/08/2018  . IR PERCUTANEOUS ART THROMBECTOMY/INFUSION INTRACRANIAL INC DIAG ANGIO  06/08/2018  . LAPAROSCOPIC LOW ANTERIOR RESECTION N/A 05/20/2013   Procedure: LAPAROSCOPIC LOW ANTERIOR RESECTION WITH SPLENIC FLEXURE MOBILIZATION;  Surgeon: Leighton Ruff, MD;  Location: [facility];  Service: General;  Laterality: N/A;  . OSTOMY N/A 05/20/2013   Procedure: diverting OSTOMY;  Surgeon: Leighton Ruff, MD;  Location: [facility];  Service: General;  Laterality: N/A;  . PERIPHERAL VASCULAR INTERVENTION Left 06/14/2018   Procedure: PERIPHERAL VASCULAR INTERVENTION;  Surgeon: Waynetta Sandy, MD;  Location: [facility];  Service: Cardiovascular;  Laterality: Left;  AVF on the Left  . POLYPECTOMY    . POLYPECTOMY  11/22/2019   Procedure: POLYPECTOMY;  Surgeon: Doran Stabler, MD;  Location: [facility];  Service: Gastroenterology;;  . RADIOLOGY WITH ANESTHESIA N/A 06/08/2018   Procedure: IR WITH ANESTHESIA;  Surgeon: Radiologist, Medication, MD;  Location: [facility];  Service: Radiology;  Laterality: N/A;  . UPPER GASTROINTESTINAL ENDOSCOPY    . VIDEO ASSISTED THORACOSCOPY (VATS)/WEDGE RESECTION Left 11/20/2015   Procedure: VIDEO ASSISTED THORACOSCOPY (VATS)/LUNG RESECTION;  Surgeon: [name], MD;  Location: [facility];  Service: Thoracic;  Laterality: Left;  Marland Kitchen VIDEO BRONCHOSCOPY N/A 11/20/2015   Procedure: VIDEO BRONCHOSCOPY;  Surgeon: [name], MD;  [facility];  Service: Thoracic;  Laterality: N/A;    Current Outpatient Medications:  Marland Kitchen   Methoxy PEG-Epoetin Beta (MIRCERA IJ), Mircera, Disp: , Rfl:  .  acetaminophen (TYLENOL) 325 MG tablet, Take 325 mg by mouth every 4 (four) hours as needed for mild pain or moderate pain. , Disp: , Rfl:  .  allopurinol (ZYLOPRIM) 100 MG tablet, Take 1 tablet by mouth once daily (Patient taking differently: Taking it in morning), Disp: 90 tablet, Rfl: 0 .  atorvastatin (LIPITOR) 20 MG tablet, TAKE 1 TABLET BY MOUTH  DAILY AT 6 PM. (Patient taking differently: Take 20 mg by mouth at bedtime. Taking in the morning (after dialysis)), Disp: 90 tablet, Rfl: 1 .  BIDIL 20-37.5 MG tablet, TAKE 1 TABLET BY MOUTH THREE TIMES DAILY, Disp: 90 tablet, Rfl: 0 .  cinacalcet (SENSIPAR) 60 MG tablet, Take 60 mg by mouth daily., Disp: , Rfl:  .  dextromethorphan (DELSYM) 30 MG/5ML liquid, Take 5 mLs (30 mg total) by mouth 2 (two) times daily., Disp: 148 mL, Rfl: 1 .  Dextromethorphan-guaiFENesin (MUCINEX DM) 30-600 MG TB12, Take 1 tablet by mouth 2 (two) times daily as needed., Disp: 28 tablet, Rfl: 0 .  FOSRENOL 1000 MG PACK, Take by mouth. Using it with meal twice a day, Disp: , Rfl:  .  gabapentin (NEURONTIN) 100 MG capsule, TAKE 1 CAPSULE BY MOUTH THREE TIMES DAILY, Disp: 90 capsule, Rfl: 0 .  lipase/protease/amylase (CREON) 36000 UNITS CPEP capsule, Take 2 capsules (72,000 Units total) by mouth 3 (three) times daily with meals. May also take 1 capsule (36,000 Units total) as needed (with snacks)., Disp: 240 capsule, Rfl: 11 .  loperamide (IMODIUM) 2 MG capsule, Take 2 mg by mouth as needed for diarrhea or loose stools., Disp: , Rfl:  .  metoprolol succinate (TOPROL-XL) 25 MG 24 hr tablet, Take 1 tablet (25 mg total) by mouth daily., Disp: 90 tablet, Rfl: 1 .  polycarbophil (FIBERCON) 625 MG tablet, Take 625 mg by mouth daily as needed for diarrhea or loose stools., Disp: , Rfl:  .  warfarin (COUMADIN) 5 MG tablet, TAKE 1 TABLET BY MOUTH DAILY, EXCEPT 1/2 TABLET ON MONDAY AND FRIDAYS OR TAKE AS DIRECTED BY  ANTICOAGULATION CLINIC, Disp: 90 tablet, Rfl: 0 Allergies  Allergen Reactions  . Nsaids Other (See Comments)    Asked by surgeon to add this medication class as intolerance due to patients renal insufficiency.  . Lactase Other (See Comments)  . Lactose Intolerance (Gi) Diarrhea  . Phentermine Other (See Comments)  . Amlodipine Other (See Comments)    Edema    Social History   Occupational History    Comment: retired Insurance account manager  Tobacco Use  . Smoking status: Former Smoker    Packs/day: 0.50    Years: 10.00    Pack years: 5.00    Types: Cigarettes    Quit date: 07/07/1977    Years since quitting: 42.7  . Smokeless tobacco: Never Used  Vaping Use  . Vaping Use: Never used  Substance and Sexual Activity  . Alcohol use: Not Currently    Alcohol/week: 0.0 standard drinks  . Drug use: No  . Sexual activity: Not Currently    Objective:   Constitutional Jason TERRONES Sr. is a pleasant 74 y.o. African American male, WD, WN in NAD. AAO x 3.   Vascular Dorsalis pedis pulses faintly palpable bilaterally. Posterior tibial pulses palpable bilaterally. Capillary refill time <3 seconds b/l.  No cyanosis or clubbing noted. Pedal hair growth absent b/l. No ischemia or gangrene noted b/l LE.  Neurologic Normal speech. Oriented to person, place, and time. Protective sensation diminished b/l LE.  Dermatologic Pedal skin with normal turgor, texture and tone b/l.  Nails thick, elongated, dystrophic with pain on palpation x 10. Hyperkeratotic lesions medial aspect of heelpads b/l with tenderness to palpation. No edema, no erythema, no drainage, no flocculence. No open wounds. No skin lesions.  Orthopedic: Normal joint ROM without pain or crepitus bilaterally. HAV with bunion deformity right >left foot. No bony tenderness.   Radiographs: None Assessment:   1. Pain due to onychomycosis of toenails of both feet   2. Callus   3. Neuropathy   4. ESRD on dialysis [facility])    Plan:   Patient was evaluated and treated and all questions answered.  Onychomycosis with pain -Nails palliatively debridement as below -Educated on self-care  Procedure: Nail Debridement Rationale: Pain Type of Debridement: manual, sharp debridement. Instrumentation: Nail nipper, rotary burr. Number of Nails: 10  -Examined patient. -No new findings. No new orders. -Discussed plantar fascial brace. Advised him insurance will only pay for one device per foot. He would be responsible for cost of brace today. He declines if he is responsible for cost on today's visit. -Toenails 1-5 b/l were debrided in length and girth with  sterile nail nippers and dremel without iatrogenic bleeding.  -Callus(es) b/l heels pared utilizing sterile scalpel blade without complication or incident. Total number debrided =2. Continue to use pillow between feet when in bed. -Patient to report any pedal injuries to medical professional immediately. -Patient to continue soft, supportive shoe gear daily. -Patient/POA to call should there be question/concern in the interim.  Return in about 9 weeks (around 05/23/2020) for nail trim.  Marzetta Board, DPM

## 2020-03-22 DIAGNOSIS — D631 Anemia in chronic kidney disease: Secondary | ICD-10-CM | POA: Diagnosis not present

## 2020-03-22 DIAGNOSIS — E876 Hypokalemia: Secondary | ICD-10-CM | POA: Diagnosis not present

## 2020-03-22 DIAGNOSIS — R197 Diarrhea, unspecified: Secondary | ICD-10-CM | POA: Diagnosis not present

## 2020-03-22 DIAGNOSIS — N2581 Secondary hyperparathyroidism of renal origin: Secondary | ICD-10-CM | POA: Diagnosis not present

## 2020-03-22 DIAGNOSIS — N186 End stage renal disease: Secondary | ICD-10-CM | POA: Diagnosis not present

## 2020-03-22 DIAGNOSIS — Z23 Encounter for immunization: Secondary | ICD-10-CM | POA: Diagnosis not present

## 2020-03-22 DIAGNOSIS — D509 Iron deficiency anemia, unspecified: Secondary | ICD-10-CM | POA: Diagnosis not present

## 2020-03-22 DIAGNOSIS — R52 Pain, unspecified: Secondary | ICD-10-CM | POA: Diagnosis not present

## 2020-03-22 DIAGNOSIS — Z992 Dependence on renal dialysis: Secondary | ICD-10-CM | POA: Diagnosis not present

## 2020-03-23 ENCOUNTER — Other Ambulatory Visit: Payer: Self-pay | Admitting: Internal Medicine

## 2020-03-24 DIAGNOSIS — E876 Hypokalemia: Secondary | ICD-10-CM | POA: Diagnosis not present

## 2020-03-24 DIAGNOSIS — N2581 Secondary hyperparathyroidism of renal origin: Secondary | ICD-10-CM | POA: Diagnosis not present

## 2020-03-24 DIAGNOSIS — Z992 Dependence on renal dialysis: Secondary | ICD-10-CM | POA: Diagnosis not present

## 2020-03-24 DIAGNOSIS — R52 Pain, unspecified: Secondary | ICD-10-CM | POA: Diagnosis not present

## 2020-03-24 DIAGNOSIS — R197 Diarrhea, unspecified: Secondary | ICD-10-CM | POA: Diagnosis not present

## 2020-03-24 DIAGNOSIS — D631 Anemia in chronic kidney disease: Secondary | ICD-10-CM | POA: Diagnosis not present

## 2020-03-24 DIAGNOSIS — N186 End stage renal disease: Secondary | ICD-10-CM | POA: Diagnosis not present

## 2020-03-24 DIAGNOSIS — D509 Iron deficiency anemia, unspecified: Secondary | ICD-10-CM | POA: Diagnosis not present

## 2020-03-24 DIAGNOSIS — Z23 Encounter for immunization: Secondary | ICD-10-CM | POA: Diagnosis not present

## 2020-03-26 DIAGNOSIS — H40033 Anatomical narrow angle, bilateral: Secondary | ICD-10-CM | POA: Diagnosis not present

## 2020-03-26 DIAGNOSIS — H401212 Low-tension glaucoma, right eye, moderate stage: Secondary | ICD-10-CM | POA: Diagnosis not present

## 2020-03-26 DIAGNOSIS — H35363 Drusen (degenerative) of macula, bilateral: Secondary | ICD-10-CM | POA: Diagnosis not present

## 2020-03-26 DIAGNOSIS — H401221 Low-tension glaucoma, left eye, mild stage: Secondary | ICD-10-CM | POA: Diagnosis not present

## 2020-03-27 DIAGNOSIS — D631 Anemia in chronic kidney disease: Secondary | ICD-10-CM | POA: Diagnosis not present

## 2020-03-27 DIAGNOSIS — R197 Diarrhea, unspecified: Secondary | ICD-10-CM | POA: Diagnosis not present

## 2020-03-27 DIAGNOSIS — D509 Iron deficiency anemia, unspecified: Secondary | ICD-10-CM | POA: Diagnosis not present

## 2020-03-27 DIAGNOSIS — E876 Hypokalemia: Secondary | ICD-10-CM | POA: Diagnosis not present

## 2020-03-27 DIAGNOSIS — Z23 Encounter for immunization: Secondary | ICD-10-CM | POA: Diagnosis not present

## 2020-03-27 DIAGNOSIS — Z992 Dependence on renal dialysis: Secondary | ICD-10-CM | POA: Diagnosis not present

## 2020-03-27 DIAGNOSIS — N2581 Secondary hyperparathyroidism of renal origin: Secondary | ICD-10-CM | POA: Diagnosis not present

## 2020-03-27 DIAGNOSIS — R52 Pain, unspecified: Secondary | ICD-10-CM | POA: Diagnosis not present

## 2020-03-27 DIAGNOSIS — N186 End stage renal disease: Secondary | ICD-10-CM | POA: Diagnosis not present

## 2020-03-29 DIAGNOSIS — N186 End stage renal disease: Secondary | ICD-10-CM | POA: Diagnosis not present

## 2020-03-29 DIAGNOSIS — R197 Diarrhea, unspecified: Secondary | ICD-10-CM | POA: Diagnosis not present

## 2020-03-29 DIAGNOSIS — D509 Iron deficiency anemia, unspecified: Secondary | ICD-10-CM | POA: Diagnosis not present

## 2020-03-29 DIAGNOSIS — D631 Anemia in chronic kidney disease: Secondary | ICD-10-CM | POA: Diagnosis not present

## 2020-03-29 DIAGNOSIS — N2581 Secondary hyperparathyroidism of renal origin: Secondary | ICD-10-CM | POA: Diagnosis not present

## 2020-03-29 DIAGNOSIS — E876 Hypokalemia: Secondary | ICD-10-CM | POA: Diagnosis not present

## 2020-03-29 DIAGNOSIS — Z23 Encounter for immunization: Secondary | ICD-10-CM | POA: Diagnosis not present

## 2020-03-29 DIAGNOSIS — Z992 Dependence on renal dialysis: Secondary | ICD-10-CM | POA: Diagnosis not present

## 2020-03-29 DIAGNOSIS — R52 Pain, unspecified: Secondary | ICD-10-CM | POA: Diagnosis not present

## 2020-03-31 DIAGNOSIS — D509 Iron deficiency anemia, unspecified: Secondary | ICD-10-CM | POA: Diagnosis not present

## 2020-03-31 DIAGNOSIS — Z992 Dependence on renal dialysis: Secondary | ICD-10-CM | POA: Diagnosis not present

## 2020-03-31 DIAGNOSIS — N2581 Secondary hyperparathyroidism of renal origin: Secondary | ICD-10-CM | POA: Diagnosis not present

## 2020-03-31 DIAGNOSIS — R52 Pain, unspecified: Secondary | ICD-10-CM | POA: Diagnosis not present

## 2020-03-31 DIAGNOSIS — E876 Hypokalemia: Secondary | ICD-10-CM | POA: Diagnosis not present

## 2020-03-31 DIAGNOSIS — N186 End stage renal disease: Secondary | ICD-10-CM | POA: Diagnosis not present

## 2020-03-31 DIAGNOSIS — Z23 Encounter for immunization: Secondary | ICD-10-CM | POA: Diagnosis not present

## 2020-03-31 DIAGNOSIS — D631 Anemia in chronic kidney disease: Secondary | ICD-10-CM | POA: Diagnosis not present

## 2020-03-31 DIAGNOSIS — R197 Diarrhea, unspecified: Secondary | ICD-10-CM | POA: Diagnosis not present

## 2020-04-02 ENCOUNTER — Telehealth: Payer: Self-pay | Admitting: Gastroenterology

## 2020-04-02 ENCOUNTER — Telehealth: Payer: Self-pay

## 2020-04-02 ENCOUNTER — Ambulatory Visit: Payer: Medicare Other | Admitting: Pharmacist

## 2020-04-02 ENCOUNTER — Other Ambulatory Visit: Payer: Self-pay

## 2020-04-02 DIAGNOSIS — I5020 Unspecified systolic (congestive) heart failure: Secondary | ICD-10-CM

## 2020-04-02 DIAGNOSIS — E785 Hyperlipidemia, unspecified: Secondary | ICD-10-CM

## 2020-04-02 DIAGNOSIS — I10 Essential (primary) hypertension: Secondary | ICD-10-CM

## 2020-04-02 DIAGNOSIS — I4891 Unspecified atrial fibrillation: Secondary | ICD-10-CM

## 2020-04-02 NOTE — Telephone Encounter (Signed)
Dr. Rush Landmark as DOD on 04/02/20 Jason [name] patient, was seen for chronic diarrhea in 02/2019  Spoke with patient and his wife, he states that he has had diarrhea for months, taking Imodium, no abdominal pain, no fever, no recent use of antibiotics, sometimes has fecal incontinence if he eats before he goes out. Wife states that he has been using disposable diapers. Advised patient to try adding a probiotic until his appt. Pt is scheduled to see Dr. Loletha Carrow on 04/04/20 at 1:20 pm. Please advise on any further recommendations, thank you

## 2020-04-02 NOTE — Telephone Encounter (Signed)
Pt is requesting a call back from a nurse to discuss his diarrhea. Nothing available soon.

## 2020-04-02 NOTE — Telephone Encounter (Signed)
Agree with plan of action. Has close follow up in next 48 hours. May consider Fibercon 1-2 capsules daily in effort of trying to bulk stool. No other recommendations.  GM

## 2020-04-02 NOTE — Chronic Care Management (AMB) (Signed)
Chronic Care Management Pharmacy  Name: Jason MCCUTCHAN Sr.  MRN: [id number] DOB: 08-Mar-1946   Chief Complaint/ HPI  Jason Cotta Sr.,  74 y.o. , male presents for their Initial CCM visit with the clinical pharmacist In office.  PCP : [name], MD Patient Care Team: [name], MD as PCP - General (Internal Medicine) Lafayette Dragon, MD (Inactive) as Attending Physician (Gastroenterology) Leighton Ruff, MD as Consulting Physician (General Surgery) [name], MD as Consulting Physician (Oncology) Elmarie Shiley, MD as Consulting Physician (Nephrology) Monna Fam, MD as Consulting Physician (Ophthalmology) Josue Hector, MD as Consulting Physician (Cardiology) Charlton Haws, [facility] as Pharmacist (Pharmacist)  Their chronic conditions include: Hypertension, Hyperlipidemia, Atrial Fibrillation, Heart Failure, COPD, Chronic Kidney Disease, Overactive Bladder, BPH and Gout , Hx embolic stroke, OSA, Hx rectal cancer, ESRD on dialysis;  Office Visits: 02/13/20 Dr Ronnald Ramp OV: viral URI - rx'd Mucinex DM  01/16/20 Dr Ronnald Ramp OV: acute prostatitis w/ hematuria - tx with cipro x 3 wks.  12/12/19 Dr Ronnald Ramp OV: chronic f/u. C/o diarrhea  Consult Visit: 02/27/20 Dr Benay Spice (oncology): f/u for rectal cancer in remission.   12/23/19 Dr Karle Starch (cardiology): f/u for CHF.  Allergies  Allergen Reactions  . Nsaids Other (See Comments)    Asked by surgeon to add this medication class as intolerance due to patients renal insufficiency.  . Lactase Other (See Comments)  . Lactose Intolerance (Gi) Diarrhea  . Phentermine Other (See Comments)  . Amlodipine Other (See Comments)    Edema     Medications: Outpatient Encounter Medications as of 04/02/2020  Medication Sig Note  . acetaminophen (TYLENOL) 325 MG tablet Take 325 mg by mouth every 4 (four) hours as needed for mild pain or moderate pain.    Marland Kitchen allopurinol (ZYLOPRIM) 100 MG tablet Take 1 tablet (100 mg total) by mouth daily.    Marland Kitchen atorvastatin (LIPITOR) 20 MG tablet TAKE 1 TABLET BY MOUTH  DAILY AT 6 PM. (Patient taking differently: Take 20 mg by mouth at bedtime. Taking in the morning (after dialysis))   . BIDIL 20-37.5 MG tablet TAKE 1 TABLET BY MOUTH THREE TIMES DAILY 04/03/2020: [facility]  . cinacalcet (SENSIPAR) 60 MG tablet Take 60 mg by mouth daily.   Marland Kitchen dextromethorphan (DELSYM) 30 MG/5ML liquid Take 5 mLs (30 mg total) by mouth 2 (two) times daily.   Marland Kitchen Dextromethorphan-guaiFENesin (MUCINEX DM) 30-600 MG TB12 Take 1 tablet by mouth 2 (two) times daily as needed.   Marland Kitchen FOSRENOL 1000 MG PACK Take by mouth. Using it with meal twice a day   . gabapentin (NEURONTIN) 100 MG capsule TAKE 1 CAPSULE BY MOUTH THREE TIMES DAILY   . lipase/protease/amylase (CREON) 36000 UNITS CPEP capsule Take 2 capsules (72,000 Units total) by mouth 3 (three) times daily with meals. May also take 1 capsule (36,000 Units total) as needed (with snacks). 04/03/2020: [facility]  . loperamide (IMODIUM) 2 MG capsule Take 2 mg by mouth as needed for diarrhea or loose stools.   . Methoxy PEG-Epoetin Beta (MIRCERA IJ) Mircera   . metoprolol succinate (TOPROL-XL) 25 MG 24 hr tablet Take 1 tablet (25 mg total) by mouth daily.   . polycarbophil (FIBERCON) 625 MG tablet Take 625 mg by mouth daily as needed for diarrhea or loose stools.   . warfarin (COUMADIN) 5 MG tablet TAKE 1 TABLET BY MOUTH DAILY, EXCEPT 1/2 TABLET ON MONDAY AND FRIDAYS OR TAKE AS DIRECTED BY ANTICOAGULATION CLINIC   . [  DISCONTINUED] allopurinol (ZYLOPRIM) 100 MG tablet Take 1 tablet by mouth once daily (Patient taking differently: Take 100 mg by mouth daily. )   . [DISCONTINUED] allopurinol (ZYLOPRIM) 100 MG tablet Take 1 tablet by mouth once daily (Patient taking differently: Taking it in morning)   . [DISCONTINUED] allopurinol (ZYLOPRIM) 100 MG tablet Take 1 tablet by mouth once daily   . [DISCONTINUED] atorvastatin (LIPITOR) 20 MG tablet TAKE 1 TABLET BY MOUTH  DAILY  AT 6 PM.   . [DISCONTINUED] metoprolol succinate (TOPROL-XL) 25 MG 24 hr tablet Take 1 tablet (25 mg total) by mouth daily.   . [DISCONTINUED] warfarin (COUMADIN) 5 MG tablet TAKE 1 TABLET BY MOUTH DAILY, EXCEPT 1/2 TABLET ON MONDAY AND FRIDAYS OR TAKE AS DIRECTED BY ANTICOAGULATION CLINIC    No facility-administered encounter medications on file as of 04/02/2020.    Wt Readings from Last 3 Encounters:  02/27/20 147 lb 3.2 oz (66.8 kg)  02/13/20 146 lb (66.2 kg)  01/16/20 148 lb (67.1 kg)    Current Diagnosis/Assessment:  SDOH Interventions     Most Recent Value  SDOH Interventions  Financial Strain Interventions Intervention Not Indicated  [patient gets brand name meds through PAP]      Goals Addressed            This Visit's Progress   . Pharmacy Care Plan       CARE PLAN ENTRY (see longitudinal plan of care for additional care plan information)  Current Barriers:  . Chronic Disease Management support, education, and care coordination needs related to Hypertension, Hyperlipidemia, Atrial Fibrillation, and Heart Failure   Hypertension / Heart Failure BP Readings from Last 3 Encounters:  02/27/20 (!) 105/51  02/13/20 (!) 150/70  01/16/20 140/70 .  Pharmacist Clinical Goal(s): o Over the next 90 days, patient will work with PharmD and providers to maintain BP goal <140/90 . Current regimen:  o Metoprolol succinate 25 mg daily o Bidil 20-37.5 (isosorbide/hydralazine) mg 3 times daily . Interventions: o Discussed BP goals and benefits of medications for prevention of heart attack / stroke . Patient self care activities - Over the next 90 days, patient will: o Check BP daily, document, and provide at future appointments o Ensure daily salt intake < 2300 mg/day  Hyperlipidemia / Hx stroke Lab Results  Component Value Date/Time   LDLCALC 44 12/12/2019 11:37 AM   LDLCALC 62 08/17/2018 08:16 AM   LDLDIRECT 120.0 06/04/2015 10:15 AM .  Pharmacist Clinical  Goal(s): o Over the next 90 days, patient will work with PharmD and providers to maintain LDL goal < 70 . Current regimen:  o Atorvastatin 20 mg daily . Interventions: o Discussed cholesterol goals and benefits of medications for prevention of heart attack / stroke . Patient self care activities - Over the next 90 days, patient will: o Continue current medication  Atrial Fibrillation . Pharmacist Clinical Goal(s) o Over the next 90 days, patient will work with PharmD and providers to optimize therapy . Current regimen:  o Metoprolol succinate 25 mg daily o Warfarin 5 mg as directed . Interventions: o Discussed benefits of medications and bleeding risk with warfarin o Advised to avoid NSAIDs . Patient self care activities - Over the next 90 days, patient will: o Continue current medications  Medication management . Pharmacist Clinical Goal(s): o Over the next 90 days, patient will work with PharmD and providers to achieve optimal medication adherence . Current pharmacy: [facility] (dialysis medications) . Interventions o Comprehensive medication review  performed. o Utilize [facility] for medication synchronization, packaging and delivery for non-dialysis medications . Patient self care activities - Over the next 90 days, patient will: o Focus on medication adherence by pill pack o Take medications as prescribed o Report any questions or concerns to PharmD and/or provider(s)  Initial goal documentation       Heart Failure / Hypertension   Type: Systolic  Last ejection fraction: 20-25% (06/09/2018)  BP goal is:  <130/80 BP Readings from Last 3 Encounters:  02/27/20 (!) 105/51  02/13/20 (!) 150/70  01/16/20 140/70   Kidney Function Lab Results  Component Value Date/Time   CREATININE 6.50 (H) 11/22/2019 08:33 AM   CREATININE 6.84 (H) 04/08/2019 05:02 PM   CREATININE 2.4 (H) 02/20/2014 02:32 PM   CREATININE 1.8 (H) 12/19/2013 08:41 AM   GFR  14.56 (LL) 05/17/2018 01:42 PM   GFRNONAA 7 (L) 04/08/2019 05:02 PM   GFRAA 8 (L) 04/08/2019 05:02 PM   K 3.3 (L) 11/22/2019 08:33 AM   K 3.7 04/08/2019 05:02 PM   K 3.5 02/20/2014 02:32 PM   K 4.4 12/19/2013 08:41 AM   Patient checks BP at home 3-5x per week Patient home BP readings are ranging: <130/80  Patient has failed these meds in past: amlodipine, atenolol, chlorthalidone, Bystolic, carvedilol, furosemide,  Patient is currently controlled on the following medications:  Marland Kitchen Metoprolol succinate 25 mg daily . Bidil 20-37.5 (isosorbide/hydralazine) mg TID (via PAP)  We discussed diet and exercise extensively; importance of maintaining BP with dialysis; pt denies recent low BP or side effects  Plan  Continue current medications and control with diet and exercise   AFIB   Patient is currently rate controlled. Office heart rates are  Pulse Readings from Last 3 Encounters:  02/27/20 63  02/13/20 70  01/16/20 78   CHA2DS2-VASc Score = 5  The patient's score is based upon: CHF History: 1 HTN History: 1 Age : 1 Diabetes History: 0 Stroke History: 2 Vascular Disease History: 0 Gender: 0  Lab Results  Component Value Date/Time   INR 1.7 (A) 03/19/2020 12:00 AM   INR 2.6 02/15/2020 12:00 AM   INR 1.7 (H) 11/22/2019 08:30 AM   INR 2.3 (H) 09/19/2019 08:39 AM   Patient has failed these meds in past: n/a Patient is currently controlled on the following medications:  Marland Kitchen Metoprolol succinate 25 mg daily . Warfarin 5 mg as directed  We discussed:  Benefits of medications for Afib and stroke prevention; pt denies recent issues with bleeding  Plan  Continue current medications   Hyperlipidemia / Hx CVA   LDL goal < 70  Lipid Panel     Component Value Date/Time   CHOL 113 12/12/2019 1137   CHOL 151 08/17/2018 0816   TRIG 73.0 12/12/2019 1137   TRIG 104 05/11/2006 1014   HDL 54.10 12/12/2019 1137   HDL 66 08/17/2018 0816   LDLCALC 44 12/12/2019 1137   LDLCALC  62 08/17/2018 0816   LDLDIRECT 120.0 06/04/2015 1015    Hepatic Function Latest Ref Rng & Units 02/26/2019 07/28/2018 06/28/2018  Total Protein 6.5 - 8.1 g/dL 7.3 7.7 -  Albumin 3.5 - 5.0 g/dL 3.8 3.3(L) 2.7(L)  AST 15 - 41 U/L 19 23 -  ALT 0 - 44 U/L 14 18 -  Alk Phosphatase 38 - 126 U/L 104 149(H) -  Total Bilirubin 0.3 - 1.2 mg/dL 0.8 0.6 -  Bilirubin, Direct 0.0 - 0.3 mg/dL - - -     The  ASCVD Risk score Mikey Bussing DC Jr., et al., 2013) failed to calculate for the following reasons:   The patient has a prior MI or stroke diagnosis   Patient has failed these meds in past: n/a Patient is currently controlled on the following medications:  . Atorvastatin 20 mg daily  We discussed:  diet and exercise extensively; Cholesterol goals; benefits of statin for ASCVD risk reduction/stroke prevention; pt does not take aspirin due to increased risk bleeding with warfarin  Plan  Continue current medications and control with diet and exercise  ESRD on dialysis   Hyperphosphatemia   Patient has failed these meds in past: n/a Patient is currently controlled on the following medications:  . Lanthanum carbonate (Fosrenol) 1000 mg BID . Cinacalcet 60 mg daily . Mircera injection . Auryxia (ferric citrate) 1 gm TID  We discussed:  Patient is on T/Th/Sa dialysis schedule and rarely misses a session. He gets his dialysis medications delivered to the clinic and reports they are free this way. Pt denies issues or side effects  Plan  Continue current medications  Diarrhea   Patient has failed these meds in past: n/a Patient is currently uncontrolled on the following medications:  . Creon 36,0000 unit - 2 capsules TID with meals  We discussed:  Pt reports worsening diarrhea over the last several weeks; he reports Creon initially helped but is not helping much anymore; counseled on proper dosing of Creon with meals and snacks; pt has follow up appt with GI this week to address  Plan  Continue  current medications  F/U with GI as scheduled  Neuropathy   Patient has failed these meds in past: n/a Patient is currently controlled on the following medications:  . Gabapentin 100 mg TID  We discussed:  Patient is satisfied with current regimen and denies issues  Plan  Continue current medications  Gout   Patient has failed these meds in past: n/a Patient is currently controlled on the following medications:  . Allopurinol 100 mg daily  We discussed: benefits of allopurinol for prevention of gout flares;  Patient is satisfied with current regimen and denies issues  Plan  Continue current medications  Health Maintenance   Patient is currently controlled on the following medications:  . Tylenol 325 mg q4h PRN . Mucinex DM PRN . Loperamide 2 mg PRN . Fibercon 625 mg PRN  We discussed:  Patient is satisfied with current OTC regimen and denies issues  Plan  Continue current medications  Medication Management   Pt uses [facility] for all medications Uses pill box? Yes Pt endorses 200% compliance  We discussed: Verbal consent obtained for [facility] enhanced pharmacy services (medication synchronization, adherence packaging, delivery coordination). A medication sync plan was created to allow patient to get all medications delivered once every 30 to 90 days per patient preference. Patient understands they have freedom to choose pharmacy and clinical pharmacist will coordinate care between all prescribers and [facility].   Plan  Utilize [facility] for medication synchronization, packaging and delivery    Follow up: 3 month phone visit  Charlene Brooke, PharmD, [facility] Clinical Pharmacist [facility] [phone]

## 2020-04-02 NOTE — Telephone Encounter (Signed)
-----   Message from [place], [facility] sent at 04/02/2020  3:27 PM EDT ----- Regarding: Med refills This patient is switching to [facility], can you order his med refills there?  Allopurinol 100 mg Metoprolol succinate 25 mg Warfarin 5 mg

## 2020-04-03 ENCOUNTER — Other Ambulatory Visit: Payer: Self-pay | Admitting: Internal Medicine

## 2020-04-03 DIAGNOSIS — M1A39X0 Chronic gout due to renal impairment, multiple sites, without tophus (tophi): Secondary | ICD-10-CM

## 2020-04-03 DIAGNOSIS — Z7901 Long term (current) use of anticoagulants: Secondary | ICD-10-CM

## 2020-04-03 DIAGNOSIS — I4891 Unspecified atrial fibrillation: Secondary | ICD-10-CM

## 2020-04-03 DIAGNOSIS — E876 Hypokalemia: Secondary | ICD-10-CM | POA: Diagnosis not present

## 2020-04-03 DIAGNOSIS — Z992 Dependence on renal dialysis: Secondary | ICD-10-CM | POA: Diagnosis not present

## 2020-04-03 DIAGNOSIS — R52 Pain, unspecified: Secondary | ICD-10-CM | POA: Diagnosis not present

## 2020-04-03 DIAGNOSIS — R197 Diarrhea, unspecified: Secondary | ICD-10-CM | POA: Diagnosis not present

## 2020-04-03 DIAGNOSIS — N2581 Secondary hyperparathyroidism of renal origin: Secondary | ICD-10-CM | POA: Diagnosis not present

## 2020-04-03 DIAGNOSIS — D509 Iron deficiency anemia, unspecified: Secondary | ICD-10-CM | POA: Diagnosis not present

## 2020-04-03 DIAGNOSIS — D631 Anemia in chronic kidney disease: Secondary | ICD-10-CM | POA: Diagnosis not present

## 2020-04-03 DIAGNOSIS — Z23 Encounter for immunization: Secondary | ICD-10-CM | POA: Diagnosis not present

## 2020-04-03 DIAGNOSIS — N186 End stage renal disease: Secondary | ICD-10-CM | POA: Diagnosis not present

## 2020-04-03 MED ORDER — ALLOPURINOL 100 MG PO TABS: 100.0000 mg | ORAL_TABLET | Freq: Every day | ORAL | 1 refills | Status: DC

## 2020-04-03 MED ORDER — METOPROLOL SUCCINATE ER 25 MG PO TB24: 25.0000 mg | ORAL_TABLET | Freq: Every day | ORAL | 1 refills | Status: DC

## 2020-04-03 MED ORDER — WARFARIN SODIUM 5 MG PO TABS: ORAL_TABLET | ORAL | 1 refills | Status: DC

## 2020-04-03 NOTE — Telephone Encounter (Signed)
Spoke with patient's wife regarding further recommendations. Advised that patient can try Fibercon 1-2 tablets daily to possibly help bulk stool. Advised to keep appt with Dr. Loletha Carrow tomorrow for further evaluation. Pt's wife verbalized understanding.

## 2020-04-03 NOTE — Patient Instructions (Addendum)
Visit Information  Phone number for Pharmacist: [phone]  Thank you for meeting with me to discuss your medications! I look forward to working with you to achieve your health care goals. Below is a summary of what we talked about during the visit:  Goals Addressed            This Visit's Progress   . Pharmacy Care Plan       CARE PLAN ENTRY (see longitudinal plan of care for additional care plan information)  Current Barriers:  . Chronic Disease Management support, education, and care coordination needs related to Hypertension, Hyperlipidemia, Atrial Fibrillation, and Heart Failure   Hypertension / Heart Failure BP Readings from Last 3 Encounters:  02/27/20 (!) 105/51  02/13/20 (!) 150/70  01/16/20 140/70 .  Pharmacist Clinical Goal(s): o Over the next 90 days, patient will work with PharmD and providers to maintain BP goal <140/90 . Current regimen:  o Metoprolol succinate 25 mg daily o Bidil 20-37.5 (isosorbide/hydralazine) mg 3 times daily . Interventions: o Discussed BP goals and benefits of medications for prevention of heart attack / stroke . Patient self care activities - Over the next 90 days, patient will: o Check BP daily, document, and provide at future appointments o Ensure daily salt intake < 2300 mg/day  Hyperlipidemia / Hx stroke Lab Results  Component Value Date/Time   LDLCALC 44 12/12/2019 11:37 AM   LDLCALC 62 08/17/2018 08:16 AM   LDLDIRECT 120.0 06/04/2015 10:15 AM .  Pharmacist Clinical Goal(s): o Over the next 90 days, patient will work with PharmD and providers to maintain LDL goal < 70 . Current regimen:  o Atorvastatin 20 mg daily . Interventions: o Discussed cholesterol goals and benefits of medications for prevention of heart attack / stroke . Patient self care activities - Over the next 90 days, patient will: o Continue current medication  Atrial Fibrillation . Pharmacist Clinical Goal(s) o Over the next 90 days, patient will work  with PharmD and providers to optimize therapy . Current regimen:  o Metoprolol succinate 25 mg daily o Warfarin 5 mg as directed . Interventions: o Discussed benefits of medications and bleeding risk with warfarin o Advised to avoid NSAIDs . Patient self care activities - Over the next 90 days, patient will: o Continue current medications  Medication management . Pharmacist Clinical Goal(s): o Over the next 90 days, patient will work with PharmD and providers to achieve optimal medication adherence . [facility]: [facility] (dialysis medications) . Interventions o Comprehensive medication review performed. o Utilize [facility] for medication synchronization, packaging and delivery for non-dialysis medications . Patient self care activities - Over the next 90 days, patient will: o Focus on medication adherence by pill pack o Take medications as prescribed o Report any questions or concerns to PharmD and/or provider(s)  Initial goal documentation      Jason [name] was given information about Chronic Care Management services today including:  1. CCM service includes personalized support from designated clinical staff supervised by his physician, including individualized plan of care and coordination with other care providers 2. 24/7 contact phone numbers for assistance for urgent and routine care needs. 3. Standard insurance, coinsurance, copays and deductibles apply for chronic care management only during months in which we provide at least 20 minutes of these services. Most insurances cover these services at 100%, however patients may be responsible for any copay, coinsurance and/or deductible if applicable. This service may help you avoid the need for more  expensive face-to-face services. 4. Only one practitioner may furnish and bill the service in a calendar month. 5. The patient may stop CCM services at any time (effective at the end of the month) by  phone call to the office staff.  Patient agreed to services and verbal consent obtained.   Print copy of patient instructions provided.  Telephone follow up appointment with pharmacy team member scheduled for: 3 months  Charlene Brooke, PharmD, BCACP Clinical Pharmacist [facility] After Age 57 After age 61, you are at a higher risk for certain long-term diseases and infections as well as injuries from falls. Falls are a major cause of broken bones and head injuries in people who are older than age 73. Getting regular preventive care can help to keep you healthy and well. Preventive care includes getting regular testing and making lifestyle changes as recommended by your health care provider. Talk with your health care provider about:  Which screenings and tests you should have. A screening is a test that checks for a disease when you have no symptoms.  A diet and exercise plan that is right for you. What should I know about screenings and tests to prevent falls? Screening and testing are the best ways to find a health problem early. Early diagnosis and treatment give you the best chance of managing medical conditions that are common after age 43. Certain conditions and lifestyle choices may make you more likely to have a fall. Your health care provider may recommend:  Regular vision checks. Poor vision and conditions such as cataracts can make you more likely to have a fall. If you wear glasses, make sure to get your prescription updated if your vision changes.  Medicine review. Work with your health care provider to regularly review all of the medicines you are taking, including over-the-counter medicines. Ask your health care provider about any side effects that may make you more likely to have a fall. Tell your health care provider if any medicines that you take make you feel dizzy or sleepy.  Osteoporosis screening.  Osteoporosis is a condition that causes the bones to get weaker. This can make the bones weak and cause them to break more easily.  Blood pressure screening. Blood pressure changes and medicines to control blood pressure can make you feel dizzy.  Strength and balance checks. Your health care provider may recommend certain tests to check your strength and balance while standing, walking, or changing positions.  Foot health exam. Foot pain and numbness, as well as not wearing proper footwear, can make you more likely to have a fall.  Depression screening. You may be more likely to have a fall if you have a fear of falling, feel emotionally low, or feel unable to do activities that you used to do.  Alcohol use screening. Using too much alcohol can affect your balance and may make you more likely to have a fall. What actions can I take to lower my risk of falls? General instructions  Talk with your health care provider about your risks for falling. Tell your health care provider if: ? You fall. Be sure to tell your health care provider about all falls, even ones that seem minor. ? You feel dizzy, sleepy, or off-balance.  Take over-the-counter and prescription medicines only as told by your health care provider. These include any supplements.  Eat a healthy diet and maintain a healthy weight. A healthy diet includes low-fat dairy products,  low-fat (lean) meats, and fiber from whole grains, beans, and lots of fruits and vegetables. Home safety  Remove any tripping hazards, such as rugs, cords, and clutter.  Install safety equipment such as grab bars in bathrooms and safety rails on stairs.  Keep rooms and walkways well-lit. Activity   Follow a regular exercise program to stay fit. This will help you maintain your balance. Ask your health care provider what types of exercise are appropriate for you.  If you need a cane or walker, use it as recommended by your health care provider.  Wear  supportive shoes that have nonskid soles. Lifestyle  Do not drink alcohol if your health care provider tells you not to drink.  If you drink alcohol, limit how much you have: ? 0-1 drink a day for women. ? 0-2 drinks a day for men.  Be aware of how much alcohol is in your drink. In the [place] one drink equals one typical bottle of beer (12 oz), one-half glass of wine (5 oz), or one shot of hard liquor (1 oz).  Do not use any products that contain nicotine or tobacco, such as cigarettes and e-cigarettes. If you need help quitting, ask your health care provider. Summary  Having a healthy lifestyle and getting preventive care can help to protect your health and wellness after age 7.  Screening and testing are the best way to find a health problem early and help you avoid having a fall. Early diagnosis and treatment give you the best chance for managing medical conditions that are more common for people who are older than age 80.  Falls are a major cause of broken bones and head injuries in people who are older than age 78. Take precautions to prevent a fall at home.  Work with your health care provider to learn what changes you can make to improve your health and wellness and to prevent falls. This information is not intended to replace advice given to you by your health care provider. Make sure you discuss any questions you have with your health care provider. Document Revised: 10/14/2018 Document Reviewed: 05/06/2017 Elsevier Patient Education  2020 [employer].

## 2020-04-03 NOTE — Addendum Note (Signed)
Addended by: Hinda Kehr on: 04/03/2020 10:25 AM   Modules accepted: Orders

## 2020-04-04 ENCOUNTER — Ambulatory Visit (INDEPENDENT_AMBULATORY_CARE_PROVIDER_SITE_OTHER): Payer: Medicare Other | Admitting: Gastroenterology

## 2020-04-04 ENCOUNTER — Encounter: Payer: Self-pay | Admitting: Gastroenterology

## 2020-04-04 VITALS — BP 142/64 | HR 68 | Ht 68.0 in | Wt 138.6 lb

## 2020-04-04 DIAGNOSIS — R110 Nausea: Secondary | ICD-10-CM

## 2020-04-04 DIAGNOSIS — K529 Noninfective gastroenteritis and colitis, unspecified: Secondary | ICD-10-CM | POA: Diagnosis not present

## 2020-04-04 DIAGNOSIS — R1033 Periumbilical pain: Secondary | ICD-10-CM

## 2020-04-04 MED ORDER — RIFAXIMIN 550 MG PO TABS: 550.0000 mg | ORAL_TABLET | Freq: Two times a day (BID) | ORAL | 0 refills | Status: DC

## 2020-04-04 MED ORDER — ONDANSETRON HCL 4 MG PO TABS: 4.0000 mg | ORAL_TABLET | Freq: Three times a day (TID) | ORAL | 1 refills | Status: DC | PRN

## 2020-04-04 NOTE — Patient Instructions (Addendum)
If you are age 73 or older, your body mass index should be between 23-30. Your Body mass index is 21.07 kg/m. If this is out of the aforementioned range listed, please consider follow up with your Primary Care Provider.  If you are age 52 or younger, your body mass index should be between 19-25. Your Body mass index is 21.07 kg/m. If this is out of the aformentioned range listed, please consider follow up with your Primary Care Provider.   Your provider has requested that you go to the basement level for lab work before leaving today. Press "B" on the elevator. The lab is located at the first door on the left as you exit the elevator.  Due to recent changes in healthcare laws, you may see the results of your imaging and laboratory studies on MyChart before your provider has had a chance to review them.  We understand that in some cases there may be results that are confusing or concerning to you. Not all laboratory results come back in the same time frame and the provider may be waiting for multiple results in order to interpret others.  Please give [place] 48 hours in order for your provider to thoroughly review all the results before contacting the office for clarification of your results.   We have sent your demographic information and a prescription for Xifaxan to [facility]. This pharmacy is able to get medication approved through insurance and get you the lowest copay possible. If you have not heard from them within 1 week, please call our office at [phone] to let us know.   It was a pleasure to see you today!  Dr. Loletha Carrow

## 2020-04-04 NOTE — Progress Notes (Signed)
Jason [name]  Chief Complaint: Chronic diarrhea  Subjective  History: Jason [name] is here to see me for diarrhea.  It has been going on since he developed acute on chronic renal failure leading to dialysis in late 2019.  Extensive lab endoscopic and radiographic work-up has been unable to identify specific cause.  He did not improve with empiric treatment of C. difficile (despite negative testing), biopsies negative for microscopic colitis or proximal small bowel disease (as recently as May of this year).  He had previous heavy alcohol use and I wondered if that plus his renal failure may have led to exocrine pancreatic insufficiency.  He has been on pancreatic enzyme supplements for at least the last year and a half and also intermittently on Viberzi.  He and his wife have poor recollection, and it is always been difficult to tell which of these medicines he has been taking when he follows up with me.  It is also always been difficult for me to determine whether antidiarrheal medicine such as Imodium or Lomotil have been helpful to him. He had empiric therapy for SIBO with flagyl and no improvement in symptoms.  I also initially thought it might be his phosphate binding agent, but a brief trial off that medicine did not improve the diarrhea as near as can be determined. Trial of budesonide (for question of patchy microscopic colitis despite nml Bx) did not help. Corderius's wife called the office 2 days ago regarding this diarrhea and asked for an office visit.   His symptoms sound similar to before, with several loose stools per day (though he tells me there is sometimes form), without bleeding.  He takes a few bottles a week of Imodium to try to get some relief.  He does not seem to recognize the pancreatic enzyme supplement when I ask if he is taking it.  (His wife was not at the visit today).  He also has intermittent nausea without vomiting and some ill-defined mid abdominal dull  discomfort without clear triggers or relieving factors.   Jason [name] has a history of rectal cancer and recently had a surveillance CT scan ordered by Dr. Benay Spice, with report as below.  Last colonoscopy May of this year with a 15 mm ascending colon tubular adenoma removed.  My pathology result letter to him recommended no future surveillance colonoscopy at his age, but in consideration of his personal history of rectal cancer, I will now recommend a repeat colonoscopy at a 3-year interval.  ROS: Cardiovascular:  no chest pain Respiratory: no dyspnea His weight fluctuates but he thinks it is lately been decreased. Remainder systems negative except as above The patient's Past Medical, Family and Social History were reviewed and are on file in the EMR.  Objective:  Med list reviewed  Current Outpatient Medications:  .  allopurinol (ZYLOPRIM) 100 MG tablet, Take 1 tablet (100 mg total) by mouth daily., Disp: 90 tablet, Rfl: 1 .  atorvastatin (LIPITOR) 20 MG tablet, TAKE 1 TABLET BY MOUTH  DAILY AT 6 PM. (Patient taking differently: Take 20 mg by mouth at bedtime. Taking in the morning (after dialysis)), Disp: 90 tablet, Rfl: 1 .  BIDIL 20-37.5 MG tablet, TAKE 1 TABLET BY MOUTH THREE TIMES DAILY, Disp: 90 tablet, Rfl: 0 .  cinacalcet (SENSIPAR) 60 MG tablet, Take 60 mg by mouth daily., Disp: , Rfl:  .  dextromethorphan (DELSYM) 30 MG/5ML liquid, Take 5 mLs (30 mg total) by mouth 2 (two) times daily., Disp: 148  mL, Rfl: 1 .  FOSRENOL 1000 MG PACK, Take by mouth. Using it with meal twice a day, Disp: , Rfl:  .  gabapentin (NEURONTIN) 100 MG capsule, TAKE 1 CAPSULE BY MOUTH THREE TIMES DAILY, Disp: 90 capsule, Rfl: 0 .  lipase/protease/amylase (CREON) 36000 UNITS CPEP capsule, Take 2 capsules (72,000 Units total) by mouth 3 (three) times daily with meals. May also take 1 capsule (36,000 Units total) as needed (with snacks)., Disp: 240 capsule, Rfl: 11 .  loperamide (IMODIUM) 2 MG capsule, Take 2 mg by  mouth as needed for diarrhea or loose stools., Disp: , Rfl:  .  Methoxy PEG-Epoetin Beta (MIRCERA IJ), Mircera, Disp: , Rfl:  .  metoprolol succinate (TOPROL-XL) 25 MG 24 hr tablet, Take 1 tablet (25 mg total) by mouth daily., Disp: 90 tablet, Rfl: 1 .  polycarbophil (FIBERCON) 625 MG tablet, Take 625 mg by mouth as needed for diarrhea or loose stools. , Disp: , Rfl:  .  warfarin (COUMADIN) 5 MG tablet, TAKE 1 TABLET BY MOUTH DAILY, EXCEPT 1/2 TABLET ON MONDAY AND FRIDAYS OR TAKE AS DIRECTED BY ANTICOAGULATION CLINIC, Disp: 90 tablet, Rfl: 1 .  acetaminophen (TYLENOL) 325 MG tablet, Take 325 mg by mouth every 4 (four) hours as needed for mild pain or moderate pain. , Disp: , Rfl:  .  ondansetron (ZOFRAN) 4 MG tablet, Take 1 tablet (4 mg total) by mouth every 8 (eight) hours as needed for nausea or vomiting., Disp: 60 tablet, Rfl: 1 .  rifaximin (XIFAXAN) 550 MG TABS tablet, Take 1 tablet (550 mg total) by mouth 2 (two) times daily., Disp: 28 tablet, Rfl: 0  No longer taking Viberzi  Vital signs in last 24 hrs: Vitals:   04/04/20 1338  BP: (!) 142/64  Pulse: 68  SpO2: 99%   Wt Readings from Last 3 Encounters:  04/04/20 138 lb 9.6 oz (62.9 kg)  02/27/20 147 lb 3.2 oz (66.8 kg)  02/13/20 146 lb (66.2 kg)   Weight 146 pounds at our office in Feb 2020  Physical Exam  Thin as before  HEENT: sclera anicteric, oral mucosa moist without lesions  Neck: supple, no thyromegaly, JVD or lymphadenopathy  Cardiac: RRR without murmurs, S1S2 heard, no peripheral edema.  AV fistula with palpable thrill left upper arm  Pulm: clear to auscultation bilaterally, normal RR and effort noted  Abdomen: soft, mild upper to mid tenderness, with active bowel sounds. No guarding or palpable hepatosplenomegaly.  Skin; warm and dry, no jaundice or rash  Labs:  CBC Latest Ref Rng & Units 11/22/2019 04/08/2019 03/09/2019  WBC 4.0 - 10.5 K/uL - 6.4 -  Hemoglobin 13.0 - 17.0 g/dL 13.6 13.2 12.6(L)  Hematocrit  39 - 52 % 40.0 42.2 37.0(L)  Platelets 150 - 400 K/uL - 179 -    ___________________________________________ Radiologic studies: CLINICAL DATA:  Colorectal cancer surveillance, rectal cancer   EXAM: CT CHEST, ABDOMEN AND PELVIS WITHOUT CONTRAST   TECHNIQUE: Multidetector CT imaging of the chest, abdomen and pelvis was performed following the standard protocol without IV contrast. Oral enteric contrast was administered.   COMPARISON:  09/14/2019   FINDINGS: CT CHEST FINDINGS   Cardiovascular: Aortic atherosclerosis. Normal heart size. Scattered three-vessel coronary artery calcifications. No pericardial effusion.   Mediastinum/Nodes: No enlarged mediastinal, hilar, or axillary lymph nodes. Thyroid gland, trachea, and esophagus demonstrate no significant findings.   Lungs/Pleura: Redemonstrated postoperative findings of posterior left upper lobe wedge resection with adjacent lobulated soft tissue (series 6, image 61). Unchanged clustered  nodules in the peripheral right upper lobe measuring up to 7 mm (series 6, image 63). There is new bronchial wall thickening, bronchial plugging, and clustered centrilobular and tree-in-bud nodularity of the dependent right lung base (series 6, image 95, 101). No pleural effusion or pneumothorax.   Musculoskeletal: No chest wall mass or suspicious bone lesions identified.   CT ABDOMEN PELVIS FINDINGS   Hepatobiliary: No solid liver abnormality is seen. No gallstones, gallbladder wall thickening, or biliary dilatation.   Pancreas: Unremarkable. No pancreatic ductal dilatation or surrounding inflammatory changes.   Spleen: Normal in size without significant abnormality.   Adrenals/Urinary Tract: Adrenal glands are unremarkable. There are numerous bilateral renal lesions of varying attenuation, similar in appearance to prior examination. No hydronephrosis. Bladder is unremarkable.   Stomach/Bowel: Stomach is within normal limits.  Appendix appears normal. Pancolonic diverticulosis. The rectum is nondistended and not well visualized. There is no significant change in mild perirectal and presacral soft tissue thickening (series 2, image 102).   Vascular/Lymphatic: Aortic atherosclerosis. No enlarged abdominal or pelvic lymph nodes.   Reproductive: No mass or other abnormality.   Other: No abdominal wall hernia or abnormality. No abdominopelvic ascites.   Musculoskeletal: No acute or significant osseous findings.   IMPRESSION: 1. The rectum is nondistended and not well visualized. There is no significant change in post treatment mild perirectal and presacral soft tissue thickening. 2. No noncontrast evidence of metastatic disease in the chest, abdomen, or pelvis. 3. Redemonstrated postoperative findings of posterior left upper lobe wedge resection with adjacent lobulated soft tissue. 4. Unchanged clustered nodules in the peripheral right upper lobe measuring up to 7 mm, most likely sequelae of prior infection or inflammation given appearance and distribution. 5. There is new bronchial wall thickening, bronchial plugging, and clustered centrilobular and tree-in-bud nodularity of the dependent right lung base, most consistent with aspiration. 6. There are numerous bilateral renal lesions of varying attenuation, similar in appearance to prior examination. These are likely to represent hemorrhagic or proteinaceous cysts, although incompletely characterized on noncontrast examination. 7. Coronary artery disease.  Aortic Atherosclerosis (ICD10-I70.0).     Electronically Signed   By: Eddie Candle M.D.   On: 03/14/2020 15:37   ____________________________________________ Other: A. COLON, ASCENDING, POLYPECTOMY:  - Tubular adenoma(s).  - High grade dysplasia is not identified.   B. COLON, RANDOM, BIOPSY:  - Essentially unremarkable colorectal type mucosa.  - There is no evidence of significant  inflammation, dysplasia, or  malignancy.   ________________  EKG 11/2019:  QTc 429 _____________________________________________ Assessment & Plan  Assessment: Encounter Diagnoses  Name Primary?  . Chronic diarrhea Yes  . Periumbilical abdominal pain   . Nausea in adult    This problem continues and remains very difficult to understand despite extensive testing noted above. Must consider rare causes Still possible SIBO but needs different antibiotic treatment History of colon cancer and history of colon polyp   Plan:  Recall colon May 2024 for polyp surveillance  zofran 4 mg, 1 tablet every 8 hours as needed Rifaximin 550 mg twice daily x14 days (we will try to get medicine improved [facility]) Fecal elastase Serum levels of gastrin, calcitonin, somatostatin and VIP  If all nml and no change, possible small bowel video capsule  40 minutes were spent on this encounter (including chart review, history/exam, counseling/coordination of care, and documentation)  [name]

## 2020-04-05 ENCOUNTER — Other Ambulatory Visit: Payer: Medicare Other

## 2020-04-05 DIAGNOSIS — N186 End stage renal disease: Secondary | ICD-10-CM | POA: Diagnosis not present

## 2020-04-05 DIAGNOSIS — E876 Hypokalemia: Secondary | ICD-10-CM | POA: Diagnosis not present

## 2020-04-05 DIAGNOSIS — N2581 Secondary hyperparathyroidism of renal origin: Secondary | ICD-10-CM | POA: Diagnosis not present

## 2020-04-05 DIAGNOSIS — K529 Noninfective gastroenteritis and colitis, unspecified: Secondary | ICD-10-CM

## 2020-04-05 DIAGNOSIS — R197 Diarrhea, unspecified: Secondary | ICD-10-CM | POA: Diagnosis not present

## 2020-04-05 DIAGNOSIS — D509 Iron deficiency anemia, unspecified: Secondary | ICD-10-CM | POA: Diagnosis not present

## 2020-04-05 DIAGNOSIS — D631 Anemia in chronic kidney disease: Secondary | ICD-10-CM | POA: Diagnosis not present

## 2020-04-05 DIAGNOSIS — Z992 Dependence on renal dialysis: Secondary | ICD-10-CM | POA: Diagnosis not present

## 2020-04-05 DIAGNOSIS — I129 Hypertensive chronic kidney disease with stage 1 through stage 4 chronic kidney disease, or unspecified chronic kidney disease: Secondary | ICD-10-CM | POA: Diagnosis not present

## 2020-04-05 DIAGNOSIS — Z23 Encounter for immunization: Secondary | ICD-10-CM | POA: Diagnosis not present

## 2020-04-05 DIAGNOSIS — I10 Essential (primary) hypertension: Secondary | ICD-10-CM | POA: Diagnosis not present

## 2020-04-05 DIAGNOSIS — R52 Pain, unspecified: Secondary | ICD-10-CM | POA: Diagnosis not present

## 2020-04-07 DIAGNOSIS — D689 Coagulation defect, unspecified: Secondary | ICD-10-CM | POA: Diagnosis not present

## 2020-04-07 DIAGNOSIS — E876 Hypokalemia: Secondary | ICD-10-CM | POA: Diagnosis not present

## 2020-04-07 DIAGNOSIS — R197 Diarrhea, unspecified: Secondary | ICD-10-CM | POA: Diagnosis not present

## 2020-04-07 DIAGNOSIS — Z992 Dependence on renal dialysis: Secondary | ICD-10-CM | POA: Diagnosis not present

## 2020-04-07 DIAGNOSIS — D631 Anemia in chronic kidney disease: Secondary | ICD-10-CM | POA: Diagnosis not present

## 2020-04-07 DIAGNOSIS — D509 Iron deficiency anemia, unspecified: Secondary | ICD-10-CM | POA: Diagnosis not present

## 2020-04-07 DIAGNOSIS — N186 End stage renal disease: Secondary | ICD-10-CM | POA: Diagnosis not present

## 2020-04-07 DIAGNOSIS — N2581 Secondary hyperparathyroidism of renal origin: Secondary | ICD-10-CM | POA: Diagnosis not present

## 2020-04-07 DIAGNOSIS — R52 Pain, unspecified: Secondary | ICD-10-CM | POA: Diagnosis not present

## 2020-04-07 DIAGNOSIS — T8249XD Other complication of vascular dialysis catheter, subsequent encounter: Secondary | ICD-10-CM | POA: Diagnosis not present

## 2020-04-10 DIAGNOSIS — T8249XD Other complication of vascular dialysis catheter, subsequent encounter: Secondary | ICD-10-CM | POA: Diagnosis not present

## 2020-04-10 DIAGNOSIS — N2581 Secondary hyperparathyroidism of renal origin: Secondary | ICD-10-CM | POA: Diagnosis not present

## 2020-04-10 DIAGNOSIS — R52 Pain, unspecified: Secondary | ICD-10-CM | POA: Diagnosis not present

## 2020-04-10 DIAGNOSIS — D631 Anemia in chronic kidney disease: Secondary | ICD-10-CM | POA: Diagnosis not present

## 2020-04-10 DIAGNOSIS — Z992 Dependence on renal dialysis: Secondary | ICD-10-CM | POA: Diagnosis not present

## 2020-04-10 DIAGNOSIS — E876 Hypokalemia: Secondary | ICD-10-CM | POA: Diagnosis not present

## 2020-04-10 DIAGNOSIS — N186 End stage renal disease: Secondary | ICD-10-CM | POA: Diagnosis not present

## 2020-04-10 DIAGNOSIS — R197 Diarrhea, unspecified: Secondary | ICD-10-CM | POA: Diagnosis not present

## 2020-04-10 DIAGNOSIS — D689 Coagulation defect, unspecified: Secondary | ICD-10-CM | POA: Diagnosis not present

## 2020-04-10 DIAGNOSIS — D509 Iron deficiency anemia, unspecified: Secondary | ICD-10-CM | POA: Diagnosis not present

## 2020-04-10 LAB — PANCREATIC ELASTASE, FECAL: Pancreatic Elastase-1, Stool: >500 mcg/g

## 2020-04-12 ENCOUNTER — Telehealth: Payer: Self-pay | Admitting: Pharmacist

## 2020-04-12 DIAGNOSIS — D689 Coagulation defect, unspecified: Secondary | ICD-10-CM | POA: Diagnosis not present

## 2020-04-12 DIAGNOSIS — N186 End stage renal disease: Secondary | ICD-10-CM | POA: Diagnosis not present

## 2020-04-12 DIAGNOSIS — N2581 Secondary hyperparathyroidism of renal origin: Secondary | ICD-10-CM | POA: Diagnosis not present

## 2020-04-12 DIAGNOSIS — E876 Hypokalemia: Secondary | ICD-10-CM | POA: Diagnosis not present

## 2020-04-12 DIAGNOSIS — D509 Iron deficiency anemia, unspecified: Secondary | ICD-10-CM | POA: Diagnosis not present

## 2020-04-12 DIAGNOSIS — Z992 Dependence on renal dialysis: Secondary | ICD-10-CM | POA: Diagnosis not present

## 2020-04-12 DIAGNOSIS — D631 Anemia in chronic kidney disease: Secondary | ICD-10-CM | POA: Diagnosis not present

## 2020-04-12 DIAGNOSIS — R52 Pain, unspecified: Secondary | ICD-10-CM | POA: Diagnosis not present

## 2020-04-12 DIAGNOSIS — R197 Diarrhea, unspecified: Secondary | ICD-10-CM | POA: Diagnosis not present

## 2020-04-12 DIAGNOSIS — T8249XD Other complication of vascular dialysis catheter, subsequent encounter: Secondary | ICD-10-CM | POA: Diagnosis not present

## 2020-04-12 NOTE — Progress Notes (Signed)
Patient has [employer] and reports copay for Mcarthur Rossetti is cost prohibitive at this time.  Reviewed application process for [employer] patient assistance program. Patient meets income/out of pocket spend criteria for the program. Patient will provide proof of income, out of pocket spend report, and will sign application. Will collaborate with prescriber for the provider portion of application. Once completed, application will be submitted via Fax   Patient assistance program Fax number: [phone]  Charlton Haws, [facility]

## 2020-04-14 DIAGNOSIS — D631 Anemia in chronic kidney disease: Secondary | ICD-10-CM | POA: Diagnosis not present

## 2020-04-14 DIAGNOSIS — R197 Diarrhea, unspecified: Secondary | ICD-10-CM | POA: Diagnosis not present

## 2020-04-14 DIAGNOSIS — E876 Hypokalemia: Secondary | ICD-10-CM | POA: Diagnosis not present

## 2020-04-14 DIAGNOSIS — Z992 Dependence on renal dialysis: Secondary | ICD-10-CM | POA: Diagnosis not present

## 2020-04-14 DIAGNOSIS — N2581 Secondary hyperparathyroidism of renal origin: Secondary | ICD-10-CM | POA: Diagnosis not present

## 2020-04-14 DIAGNOSIS — N186 End stage renal disease: Secondary | ICD-10-CM | POA: Diagnosis not present

## 2020-04-14 DIAGNOSIS — D689 Coagulation defect, unspecified: Secondary | ICD-10-CM | POA: Diagnosis not present

## 2020-04-14 DIAGNOSIS — R52 Pain, unspecified: Secondary | ICD-10-CM | POA: Diagnosis not present

## 2020-04-14 DIAGNOSIS — T8249XD Other complication of vascular dialysis catheter, subsequent encounter: Secondary | ICD-10-CM | POA: Diagnosis not present

## 2020-04-14 DIAGNOSIS — D509 Iron deficiency anemia, unspecified: Secondary | ICD-10-CM | POA: Diagnosis not present

## 2020-04-16 ENCOUNTER — Other Ambulatory Visit: Payer: Self-pay

## 2020-04-16 ENCOUNTER — Ambulatory Visit (INDEPENDENT_AMBULATORY_CARE_PROVIDER_SITE_OTHER): Payer: Medicare Other | Admitting: General Practice

## 2020-04-16 DIAGNOSIS — Z7901 Long term (current) use of anticoagulants: Secondary | ICD-10-CM

## 2020-04-16 DIAGNOSIS — I4891 Unspecified atrial fibrillation: Secondary | ICD-10-CM | POA: Diagnosis not present

## 2020-04-16 LAB — CALCITONIN: Calcitonin: <2 pg/mL (ref <=10)

## 2020-04-16 LAB — GASTRIN: Gastrin: 35 pg/mL (ref <=100)

## 2020-04-16 LAB — POCT INR: INR: 1.5 — AB (ref 2.0–3.0)

## 2020-04-16 LAB — SOMATOSTATIN: SOMATOSTATIN: 38 pg/mL — ABNORMAL HIGH

## 2020-04-16 LAB — VASOACTIVE INTESTINAL PEPTIDE (VIP): Vasoactive Intest Polypeptide: 53 pg/mL (ref <78)

## 2020-04-16 NOTE — Patient Instructions (Addendum)
Pre visit review using our clinic review tool, if applicable. No additional management support is needed unless otherwise documented below in the visit note.  Take 1 tablet today (10/11) and take 1 1/2 tablets tomorrow (10/12) and then change dosage and take 1 tablet daily except 1/2 tablet on Monday and Fridays.  Re-check in 3 weeks.

## 2020-04-17 DIAGNOSIS — N186 End stage renal disease: Secondary | ICD-10-CM | POA: Diagnosis not present

## 2020-04-17 DIAGNOSIS — R52 Pain, unspecified: Secondary | ICD-10-CM | POA: Diagnosis not present

## 2020-04-17 DIAGNOSIS — T8249XD Other complication of vascular dialysis catheter, subsequent encounter: Secondary | ICD-10-CM | POA: Diagnosis not present

## 2020-04-17 DIAGNOSIS — R197 Diarrhea, unspecified: Secondary | ICD-10-CM | POA: Diagnosis not present

## 2020-04-17 DIAGNOSIS — D689 Coagulation defect, unspecified: Secondary | ICD-10-CM | POA: Diagnosis not present

## 2020-04-17 DIAGNOSIS — D631 Anemia in chronic kidney disease: Secondary | ICD-10-CM | POA: Diagnosis not present

## 2020-04-17 DIAGNOSIS — D509 Iron deficiency anemia, unspecified: Secondary | ICD-10-CM | POA: Diagnosis not present

## 2020-04-17 DIAGNOSIS — N2581 Secondary hyperparathyroidism of renal origin: Secondary | ICD-10-CM | POA: Diagnosis not present

## 2020-04-17 DIAGNOSIS — E876 Hypokalemia: Secondary | ICD-10-CM | POA: Diagnosis not present

## 2020-04-17 DIAGNOSIS — Z992 Dependence on renal dialysis: Secondary | ICD-10-CM | POA: Diagnosis not present

## 2020-04-18 ENCOUNTER — Other Ambulatory Visit: Payer: Self-pay

## 2020-04-18 DIAGNOSIS — T829XXA Unspecified complication of cardiac and vascular prosthetic device, implant and graft, initial encounter: Secondary | ICD-10-CM | POA: Insufficient documentation

## 2020-04-18 DIAGNOSIS — R634 Abnormal weight loss: Secondary | ICD-10-CM

## 2020-04-18 DIAGNOSIS — Z992 Dependence on renal dialysis: Secondary | ICD-10-CM | POA: Diagnosis not present

## 2020-04-18 DIAGNOSIS — N186 End stage renal disease: Secondary | ICD-10-CM | POA: Diagnosis not present

## 2020-04-18 DIAGNOSIS — K529 Noninfective gastroenteritis and colitis, unspecified: Secondary | ICD-10-CM

## 2020-04-19 DIAGNOSIS — Z992 Dependence on renal dialysis: Secondary | ICD-10-CM | POA: Diagnosis not present

## 2020-04-19 DIAGNOSIS — N2581 Secondary hyperparathyroidism of renal origin: Secondary | ICD-10-CM | POA: Diagnosis not present

## 2020-04-19 DIAGNOSIS — D509 Iron deficiency anemia, unspecified: Secondary | ICD-10-CM | POA: Diagnosis not present

## 2020-04-19 DIAGNOSIS — T8249XD Other complication of vascular dialysis catheter, subsequent encounter: Secondary | ICD-10-CM | POA: Diagnosis not present

## 2020-04-19 DIAGNOSIS — R197 Diarrhea, unspecified: Secondary | ICD-10-CM | POA: Diagnosis not present

## 2020-04-19 DIAGNOSIS — E876 Hypokalemia: Secondary | ICD-10-CM | POA: Diagnosis not present

## 2020-04-19 DIAGNOSIS — D631 Anemia in chronic kidney disease: Secondary | ICD-10-CM | POA: Diagnosis not present

## 2020-04-19 DIAGNOSIS — D689 Coagulation defect, unspecified: Secondary | ICD-10-CM | POA: Diagnosis not present

## 2020-04-19 DIAGNOSIS — R52 Pain, unspecified: Secondary | ICD-10-CM | POA: Diagnosis not present

## 2020-04-19 DIAGNOSIS — N186 End stage renal disease: Secondary | ICD-10-CM | POA: Diagnosis not present

## 2020-04-21 DIAGNOSIS — T8249XD Other complication of vascular dialysis catheter, subsequent encounter: Secondary | ICD-10-CM | POA: Diagnosis not present

## 2020-04-21 DIAGNOSIS — D631 Anemia in chronic kidney disease: Secondary | ICD-10-CM | POA: Diagnosis not present

## 2020-04-21 DIAGNOSIS — N186 End stage renal disease: Secondary | ICD-10-CM | POA: Diagnosis not present

## 2020-04-21 DIAGNOSIS — D509 Iron deficiency anemia, unspecified: Secondary | ICD-10-CM | POA: Diagnosis not present

## 2020-04-21 DIAGNOSIS — R197 Diarrhea, unspecified: Secondary | ICD-10-CM | POA: Diagnosis not present

## 2020-04-21 DIAGNOSIS — D689 Coagulation defect, unspecified: Secondary | ICD-10-CM | POA: Diagnosis not present

## 2020-04-21 DIAGNOSIS — R52 Pain, unspecified: Secondary | ICD-10-CM | POA: Diagnosis not present

## 2020-04-21 DIAGNOSIS — Z992 Dependence on renal dialysis: Secondary | ICD-10-CM | POA: Diagnosis not present

## 2020-04-21 DIAGNOSIS — E876 Hypokalemia: Secondary | ICD-10-CM | POA: Diagnosis not present

## 2020-04-21 DIAGNOSIS — N2581 Secondary hyperparathyroidism of renal origin: Secondary | ICD-10-CM | POA: Diagnosis not present

## 2020-04-24 ENCOUNTER — Other Ambulatory Visit: Payer: Self-pay

## 2020-04-24 ENCOUNTER — Telehealth: Payer: Self-pay

## 2020-04-24 DIAGNOSIS — T8249XD Other complication of vascular dialysis catheter, subsequent encounter: Secondary | ICD-10-CM | POA: Diagnosis not present

## 2020-04-24 DIAGNOSIS — R197 Diarrhea, unspecified: Secondary | ICD-10-CM | POA: Diagnosis not present

## 2020-04-24 DIAGNOSIS — R634 Abnormal weight loss: Secondary | ICD-10-CM

## 2020-04-24 DIAGNOSIS — N2581 Secondary hyperparathyroidism of renal origin: Secondary | ICD-10-CM | POA: Diagnosis not present

## 2020-04-24 DIAGNOSIS — D631 Anemia in chronic kidney disease: Secondary | ICD-10-CM | POA: Diagnosis not present

## 2020-04-24 DIAGNOSIS — K529 Noninfective gastroenteritis and colitis, unspecified: Secondary | ICD-10-CM

## 2020-04-24 DIAGNOSIS — N186 End stage renal disease: Secondary | ICD-10-CM | POA: Diagnosis not present

## 2020-04-24 DIAGNOSIS — D509 Iron deficiency anemia, unspecified: Secondary | ICD-10-CM | POA: Diagnosis not present

## 2020-04-24 DIAGNOSIS — R52 Pain, unspecified: Secondary | ICD-10-CM | POA: Diagnosis not present

## 2020-04-24 DIAGNOSIS — Z992 Dependence on renal dialysis: Secondary | ICD-10-CM | POA: Diagnosis not present

## 2020-04-24 DIAGNOSIS — D689 Coagulation defect, unspecified: Secondary | ICD-10-CM | POA: Diagnosis not present

## 2020-04-24 DIAGNOSIS — E876 Hypokalemia: Secondary | ICD-10-CM | POA: Diagnosis not present

## 2020-04-24 NOTE — Telephone Encounter (Signed)
Left message for Jason [name] in Nuclear Medicine to return call to get patient scheduled for a Dototate scan scheduled at [facility]. Advised that this would need to be scheduled on a Monday, Wednesday, or Friday morning as patient has dialysis on the other days. Provided my direct number if she has any questions.

## 2020-04-24 NOTE — Telephone Encounter (Signed)
Jason [name] from Nuclear Medicine returned call in regards to scheduling patient for Dototate scan. Patient is scheduled at [facility] on 05/09/20 at 8 AM, must arrive at 7:30 AM.   Spoke with patient's wife, she is aware of appt information and she read back all appt information. She had no concerns at this time.

## 2020-04-24 NOTE — Telephone Encounter (Signed)
-----   Message from Darden Dates sent at 04/23/2020  9:40 AM EDT ----- Regarding: RE: DOTOTATE scan Auth# [id number] good 04/23/20-06/07/20 Patient is authorized to be scheduled at [facility]. Thanks! Amy ----- Message ----- From: Yevette Edwards, RN Sent: 04/18/2020   1:43 PM EDT To: Darden Dates Subject: DOTOTATE scan                                  Hi Amy!   Dr. Loletha Carrow is wanting me to get a DOTOTATE scan scheduled for this patient. Are you able to begin an authorization on this so that I can get him scheduled? Let me know if you need anything else from me.  Diagnoses are chronic diarrhea and weight loss, elevated serum somatostain level.  Thank You

## 2020-04-25 ENCOUNTER — Other Ambulatory Visit: Payer: Self-pay

## 2020-04-25 ENCOUNTER — Telehealth: Payer: Self-pay | Admitting: Pharmacist

## 2020-04-25 ENCOUNTER — Ambulatory Visit: Payer: Medicare Other | Admitting: Physician Assistant

## 2020-04-25 VITALS — BP 102/58 | HR 70 | Temp 97.9°F | Resp 20 | Ht 68.0 in | Wt 139.6 lb

## 2020-04-25 DIAGNOSIS — Z992 Dependence on renal dialysis: Secondary | ICD-10-CM | POA: Diagnosis not present

## 2020-04-25 DIAGNOSIS — N186 End stage renal disease: Secondary | ICD-10-CM | POA: Diagnosis not present

## 2020-04-25 NOTE — Progress Notes (Signed)
Established Dialysis Access   History of Present Illness   Jason FIFE Sr. is a 74 y.o. (01-Jun-1946) male who presents for evaluation of ulcerated areas over left brachiocephalic AV fistula. His initial fistula was created on 03/24/16 by Dr. Oneida Alar. He reports that these ulcerated areas began appearing about 3 weeks ago. He had a TDC placed which they have been using to dialyze him. He states prior to that his fistula was working well. He does not report any bleeding or swelling of the left arm. He does not have any steal symptoms.  He currently dialyzes Tues/ Thurs/ Sat at the [facility] location. Nephrologist is Dr. Patel/ Dr. Hollie Salk  Current Outpatient Medications  Medication Sig Dispense Refill  . heparin 1000 unit/mL SOLN injection Heparin Sodium (Porcine) 1,000 Units/mL Catheter Lock Arterial    . iron sucrose in sodium chloride 0.9 % 100 mL Iron Sucrose (Venofer)    . acetaminophen (TYLENOL) 325 MG tablet Take 325 mg by mouth every 4 (four) hours as needed for mild pain or moderate pain.     Marland Kitchen allopurinol (ZYLOPRIM) 100 MG tablet Take 1 tablet (100 mg total) by mouth daily. 90 tablet 1  . atorvastatin (LIPITOR) 20 MG tablet TAKE 1 TABLET BY MOUTH  DAILY AT 6 PM. (Patient taking differently: Take 20 mg by mouth at bedtime. Taking in the morning (after dialysis)) 90 tablet 1  . BIDIL 20-37.5 MG tablet TAKE 1 TABLET BY MOUTH THREE TIMES DAILY 90 tablet 0  . cinacalcet (SENSIPAR) 60 MG tablet Take 60 mg by mouth daily.    Marland Kitchen dextromethorphan (DELSYM) 30 MG/5ML liquid Take 5 mLs (30 mg total) by mouth 2 (two) times daily. 148 mL 1  . FOSRENOL 1000 MG PACK Take by mouth. Using it with meal twice a day    . gabapentin (NEURONTIN) 100 MG capsule TAKE 1 CAPSULE BY MOUTH THREE TIMES DAILY 90 capsule 0  . lipase/protease/amylase (CREON) 36000 UNITS CPEP capsule Take 2 capsules (72,000 Units total) by mouth 3 (three) times daily with meals. May also take 1 capsule (36,000 Units total) as  needed (with snacks). 240 capsule 11  . loperamide (IMODIUM) 2 MG capsule Take 2 mg by mouth as needed for diarrhea or loose stools.    . Methoxy PEG-Epoetin Beta (MIRCERA IJ) Mircera    . metoprolol succinate (TOPROL-XL) 25 MG 24 hr tablet Take 1 tablet (25 mg total) by mouth daily. 90 tablet 1  . ondansetron (ZOFRAN) 4 MG tablet Take 1 tablet (4 mg total) by mouth every 8 (eight) hours as needed for nausea or vomiting. 60 tablet 1  . polycarbophil (FIBERCON) 625 MG tablet Take 625 mg by mouth as needed for diarrhea or loose stools.     . rifaximin (XIFAXAN) 550 MG TABS tablet Take 1 tablet (550 mg total) by mouth 2 (two) times daily. 28 tablet 0  . warfarin (COUMADIN) 5 MG tablet TAKE 1 TABLET BY MOUTH DAILY, EXCEPT 1/2 TABLET ON MONDAY AND FRIDAYS OR TAKE AS DIRECTED BY ANTICOAGULATION CLINIC 90 tablet 1   No current facility-administered medications for this visit.    On ROS today: Negative unless stated in HPI  Physical Examination   Vitals:   04/25/20 0854  BP: (!) 102/58  Pulse: 70  Resp: 20  Temp: 97.9 F (36.6 C)  TempSrc: Temporal  SpO2: 99%  Weight: 139 lb 9.6 oz (63.3 kg)  Height: 5\' 8"  (1.727 m)   Body mass index is 21.23 kg/m.  General Well appearing, well nourished, in no distress  Pulmonary Non labored  Cardiac Regular rate and rhythm  Vascular Vessel Right Left  Radial Palpable Palpable    Musculo- skeletal   Left av fistula with palpable thrill. Two areas of ulceration more proximal with eschar. No active bleeding. Some hypopigmentation in aneurysmal areas. M/S 5/5 throughout  , Extremities without ischemic changes    Neurologic A&O; CN grossly intact      Medical Decision Making    Jason BURGEN Sr. is a 74 y.o. male who presents for evaluation of ulcerated areas over his left BC fistula. He currently is dialyzing via a right IJ TDC on Tues/ Thurs/ Sat. Discussed with him continued use of the [facility] throughout healing process of his left arm.  Recommend that he has a Plication and revision of his left brachiocephalic AV fistula due to the ulcerated aneurysmal areas and concern for these areas to erode and cause extensive bleeding. He is agreeable to proceed.   He is on coumadin which will need to be held for 3 days  He will be scheduled for Plication and Revision of left BC fistula with Dr. Jeralene Peters, PA-C [facility] [place] Office: [phone]  Clinic MD: Dr. Oneida Alar

## 2020-04-25 NOTE — H&P (View-Only) (Signed)
Established Dialysis Access   History of Present Illness   SHERREL SHAFER Sr. is a 74 y.o. ([birth year]) male who presents for evaluation of ulcerated areas over left brachiocephalic AV fistula. His initial fistula was created on 03/24/16 by Dr. Oneida Alar. He reports that these ulcerated areas began appearing about 3 weeks ago. He had a TDC placed which they have been using to dialyze him. He states prior to that his fistula was working well. He does not report any bleeding or swelling of the left arm. He does not have any steal symptoms.  He currently dialyzes Tues/ Thurs/ Sat at the [facility] location. Nephrologist is Dr. Patel/ Dr. Hollie Salk  Current Outpatient Medications  Medication Sig Dispense Refill  . heparin 1000 unit/mL SOLN injection Heparin Sodium (Porcine) 1,000 Units/mL Catheter Lock Arterial    . iron sucrose in sodium chloride 0.9 % 100 mL Iron Sucrose (Venofer)    . acetaminophen (TYLENOL) 325 MG tablet Take 325 mg by mouth every 4 (four) hours as needed for mild pain or moderate pain.     Marland Kitchen allopurinol (ZYLOPRIM) 100 MG tablet Take 1 tablet (100 mg total) by mouth daily. 90 tablet 1  . atorvastatin (LIPITOR) 20 MG tablet TAKE 1 TABLET BY MOUTH  DAILY AT 6 PM. (Patient taking differently: Take 20 mg by mouth at bedtime. Taking in the morning (after dialysis)) 90 tablet 1  . BIDIL 20-37.5 MG tablet TAKE 1 TABLET BY MOUTH THREE TIMES DAILY 90 tablet 0  . cinacalcet (SENSIPAR) 60 MG tablet Take 60 mg by mouth daily.    Marland Kitchen dextromethorphan (DELSYM) 30 MG/5ML liquid Take 5 mLs (30 mg total) by mouth 2 (two) times daily. 148 mL 1  . FOSRENOL 1000 MG PACK Take by mouth. Using it with meal twice a day    . gabapentin (NEURONTIN) 100 MG capsule TAKE 1 CAPSULE BY MOUTH THREE TIMES DAILY 90 capsule 0  . lipase/protease/amylase (CREON) 36000 UNITS CPEP capsule Take 2 capsules (72,000 Units total) by mouth 3 (three) times daily with meals. May also take 1 capsule (36,000 Units total) as  needed (with snacks). 240 capsule 11  . loperamide (IMODIUM) 2 MG capsule Take 2 mg by mouth as needed for diarrhea or loose stools.    . Methoxy PEG-Epoetin Beta (MIRCERA IJ) Mircera    . metoprolol succinate (TOPROL-XL) 25 MG 24 hr tablet Take 1 tablet (25 mg total) by mouth daily. 90 tablet 1  . ondansetron (ZOFRAN) 4 MG tablet Take 1 tablet (4 mg total) by mouth every 8 (eight) hours as needed for nausea or vomiting. 60 tablet 1  . polycarbophil (FIBERCON) 625 MG tablet Take 625 mg by mouth as needed for diarrhea or loose stools.     . rifaximin (XIFAXAN) 550 MG TABS tablet Take 1 tablet (550 mg total) by mouth 2 (two) times daily. 28 tablet 0  . warfarin (COUMADIN) 5 MG tablet TAKE 1 TABLET BY MOUTH DAILY, EXCEPT 1/2 TABLET ON MONDAY AND FRIDAYS OR TAKE AS DIRECTED BY ANTICOAGULATION CLINIC 90 tablet 1   No current facility-administered medications for this visit.    On ROS today: Negative unless stated in HPI  Physical Examination   Vitals:   04/25/20 0854  BP: (!) 102/58  Pulse: 70  Resp: 20  Temp: 97.9 F (36.6 C)  TempSrc: Temporal  SpO2: 99%  Weight: 139 lb 9.6 oz (63.3 kg)  Height: 5\' 8"  (1.727 m)   Body mass index is 21.23 kg/m.  General Well appearing, well nourished, in no distress  Pulmonary Non labored  Cardiac Regular rate and rhythm  Vascular Vessel Right Left  Radial Palpable Palpable    Musculo- skeletal   Left av fistula with palpable thrill. Two areas of ulceration more proximal with eschar. No active bleeding. Some hypopigmentation in aneurysmal areas. M/S 5/5 throughout  , Extremities without ischemic changes    Neurologic A&O; CN grossly intact      Medical Decision Making    RAEQWON LUX Sr. is a 74 y.o. male who presents for evaluation of ulcerated areas over his left BC fistula. He currently is dialyzing via a right IJ TDC on Tues/ Thurs/ Sat. Discussed with him continued use of the [facility] throughout healing process of his left arm.  Recommend that he has a Plication and revision of his left brachiocephalic AV fistula due to the ulcerated aneurysmal areas and concern for these areas to erode and cause extensive bleeding. He is agreeable to proceed.   He is on coumadin which will need to be held for 3 days  He will be scheduled for Plication and Revision of left BC fistula with Dr. Jeralene Peters, PA-C [facility] [place] Office: [phone]  Clinic MD: Dr. Oneida Alar

## 2020-04-25 NOTE — Progress Notes (Signed)
Received message from [facility] that patient needs refill for gabapentin. Last prescribed 02/28/20 for 30 day supply. Forwarding request to PCP.  Also, received fax from [place], Xifaxan PAP was denied. Patient to receive letter of denial as well.

## 2020-04-26 DIAGNOSIS — T8249XD Other complication of vascular dialysis catheter, subsequent encounter: Secondary | ICD-10-CM | POA: Diagnosis not present

## 2020-04-26 DIAGNOSIS — E876 Hypokalemia: Secondary | ICD-10-CM | POA: Diagnosis not present

## 2020-04-26 DIAGNOSIS — R52 Pain, unspecified: Secondary | ICD-10-CM | POA: Diagnosis not present

## 2020-04-26 DIAGNOSIS — D509 Iron deficiency anemia, unspecified: Secondary | ICD-10-CM | POA: Diagnosis not present

## 2020-04-26 DIAGNOSIS — D689 Coagulation defect, unspecified: Secondary | ICD-10-CM | POA: Diagnosis not present

## 2020-04-26 DIAGNOSIS — N2581 Secondary hyperparathyroidism of renal origin: Secondary | ICD-10-CM | POA: Diagnosis not present

## 2020-04-26 DIAGNOSIS — D631 Anemia in chronic kidney disease: Secondary | ICD-10-CM | POA: Diagnosis not present

## 2020-04-26 DIAGNOSIS — R197 Diarrhea, unspecified: Secondary | ICD-10-CM | POA: Diagnosis not present

## 2020-04-26 DIAGNOSIS — Z992 Dependence on renal dialysis: Secondary | ICD-10-CM | POA: Diagnosis not present

## 2020-04-26 DIAGNOSIS — N186 End stage renal disease: Secondary | ICD-10-CM | POA: Diagnosis not present

## 2020-04-27 ENCOUNTER — Other Ambulatory Visit (HOSPITAL_COMMUNITY)
Admission: RE | Admit: 2020-04-27 | Discharge: 2020-04-27 | Disposition: A | Discharge status: Home / Self Care | Payer: Medicare Other | Source: Ambulatory Visit | Attending: Vascular Surgery | Admitting: Vascular Surgery

## 2020-04-27 ENCOUNTER — Ambulatory Visit (INDEPENDENT_AMBULATORY_CARE_PROVIDER_SITE_OTHER): Payer: Medicare Other

## 2020-04-27 ENCOUNTER — Other Ambulatory Visit: Payer: Self-pay

## 2020-04-27 DIAGNOSIS — Z20822 Contact with and (suspected) exposure to covid-19: Secondary | ICD-10-CM | POA: Diagnosis not present

## 2020-04-27 DIAGNOSIS — Z01812 Encounter for preprocedural laboratory examination: Secondary | ICD-10-CM | POA: Insufficient documentation

## 2020-04-27 DIAGNOSIS — Z0000 Encounter for general adult medical examination without abnormal findings: Secondary | ICD-10-CM

## 2020-04-27 LAB — SARS CORONAVIRUS 2 (TAT 6-24 HRS): SARS Coronavirus 2: NEGATIVE

## 2020-04-27 NOTE — Progress Notes (Addendum)
I connected with [name] today by telephone and verified that I am speaking with the correct person using two identifiers. Location patient: home Location provider: work Persons participating in the virtual visit: Mandy Fitzwater and Lisette Abu, LPN  I discussed the limitations, risks, security and privacy concerns of performing an evaluation and management service by telephone and the availability of in person appointments. I also discussed with the patient that there may be a patient responsible charge related to this service. The patient expressed understanding and verbally consented to this telephonic visit.    Interactive audio and video telecommunications were attempted between this provider and patient, however failed, due to patient having technical difficulties OR patient did not have access to video capability.  We continued and completed visit with audio only.  Some vital signs may be absent or patient reported.   Time Spent with patient on telephone encounter: 20 minutes  Subjective:   Jason Cotta Sr. is a 74 y.o. male who presents for Medicare Annual/Subsequent preventive examination.  Review of Systems    NO ROS. Medicare Wellness Visit Cardiac Risk Factors include: advanced age (>13men, >37 women);hypertension;male gender;family history of premature cardiovascular disease     Objective:    There were no vitals filed for this visit. There is no height or weight on file to calculate BMI.  Advanced Directives 04/27/2020 02/27/2020 11/22/2019 10/17/2019 04/15/2019 04/08/2019 03/09/2019  Does Patient Have a Medical Advance Directive? Yes No Yes Yes Yes Yes Yes  Type of Advance Directive Living will [place];Living will [place];Living will [place];Living will [place];Living will Living will [place];Living will  Does patient want to make changes to medical advance  directive? No - Patient declined No - Patient declined - - - - -  Copy of [place] in Chart? - Yes - validated most recent copy scanned in chart (See row information) No - copy requested Yes - validated most recent copy scanned in chart (See row information) No - copy requested - No - copy requested  Would patient like information on creating a medical advance directive? - - - - - - -  Pre-existing out of facility DNR order (yellow form or pink MOST form) - - - - - - -    Current Medications (verified) Outpatient Encounter Medications as of 04/27/2020  Medication Sig   acetaminophen (TYLENOL) 325 MG tablet Take 650 mg by mouth every 4 (four) hours as needed for mild pain or moderate pain.    allopurinol (ZYLOPRIM) 100 MG tablet Take 1 tablet (100 mg total) by mouth daily.   atorvastatin (LIPITOR) 20 MG tablet TAKE 1 TABLET BY MOUTH  DAILY AT 6 PM. (Patient taking differently: Take 20 mg by mouth every evening. )   BIDIL 20-37.5 MG tablet TAKE 1 TABLET BY MOUTH THREE TIMES DAILY (Patient taking differently: Take 1 tablet by mouth 3 (three) times daily. )   cinacalcet (SENSIPAR) 60 MG tablet Take 60 mg by mouth daily.   dextromethorphan (DELSYM) 30 MG/5ML liquid Take 5 mLs (30 mg total) by mouth 2 (two) times daily. (Patient not taking: Reported on 04/26/2020)   FOSRENOL 1000 MG PACK Take 1,000 mg by mouth 3 (three) times daily with meals.    gabapentin (NEURONTIN) 100 MG capsule TAKE 1 CAPSULE BY MOUTH THREE TIMES DAILY (Patient taking differently: Take 100 mg by mouth 3 (three) times daily. )   heparin 1000 unit/mL  SOLN injection Heparin Sodium (Porcine) 1,000 Units/mL Catheter Lock Arterial   lipase/protease/amylase (CREON) 36000 UNITS CPEP capsule Take 2 capsules (72,000 Units total) by mouth 3 (three) times daily with meals. May also take 1 capsule (36,000 Units total) as needed (with snacks).   loperamide (IMODIUM) 2 MG capsule Take 2 mg by mouth as needed for diarrhea or  loose stools.   Methoxy PEG-Epoetin Beta (MIRCERA IJ) Mircera   metoprolol succinate (TOPROL-XL) 25 MG 24 hr tablet Take 1 tablet (25 mg total) by mouth daily.   ondansetron (ZOFRAN) 4 MG tablet Take 1 tablet (4 mg total) by mouth every 8 (eight) hours as needed for nausea or vomiting. (Patient not taking: Reported on 04/26/2020)   polycarbophil (FIBERCON) 625 MG tablet Take 625 mg by mouth as needed for diarrhea or loose stools.    rifaximin (XIFAXAN) 550 MG TABS tablet Take 1 tablet (550 mg total) by mouth 2 (two) times daily. (Patient not taking: Reported on 04/26/2020)   warfarin (COUMADIN) 5 MG tablet TAKE 1 TABLET BY MOUTH DAILY, EXCEPT 1/2 TABLET ON MONDAY AND FRIDAYS OR TAKE AS DIRECTED BY ANTICOAGULATION CLINIC (Patient taking differently: Take 2.5-5 mg by mouth See admin instructions. Take 5 mg on Sun, Tue, Wed, Thurs, and Sat. Take 2.5 mg on Mon and Fri.)   [DISCONTINUED] allopurinol (ZYLOPRIM) 100 MG tablet Take 1 tablet by mouth once daily (Patient taking differently: Take 100 mg by mouth daily. )   [DISCONTINUED] allopurinol (ZYLOPRIM) 100 MG tablet Take 1 tablet by mouth once daily (Patient taking differently: Taking it in morning)   [DISCONTINUED] atorvastatin (LIPITOR) 20 MG tablet TAKE 1 TABLET BY MOUTH  DAILY AT 6 PM.   No facility-administered encounter medications on file as of 04/27/2020.    Allergies (verified) Nsaids, Lactase, Lactose intolerance (gi), Phentermine, and Amlodipine   History: Past Medical History:  Diagnosis Date   Anemia    Arthritis    Atrial fibrillation ([place])    on Coumadin    Chronic renal insufficiency 07/31/2011   COPD (chronic obstructive pulmonary disease) (HCC)    pt denies   CVA (cerebral vascular accident) ([place]) 06/2018   Deficiency anemia 07/20/2012   Diverticulosis    Edema 09/21/2010   Qualifier: Diagnosis of  By: Jerold Coombe     ERECTILE DYSFUNCTION, MILD 05/11/2007   Qualifier: Diagnosis of  By: Sherren Mocha MD, Jory Ee     Exertional dyspnea 10/30/2015   GOUT 05/11/2007   Qualifier: Diagnosis of  By: Sherren Mocha MD, Dellis Filbert A    Hereditary and idiopathic peripheral neuropathy 09/14/2014   History of radiation therapy 02/21/13-03/31/13   rectum 50.4Gy total dose   Hypersomnia 10/30/2015   Hypertension    Lung nodule 10/30/2015   OAB (overactive bladder) 01/09/2016   Obesity (BMI 30.0-34.9) 10/30/2015   Obstructive sleep apnea 07/29/2010   HST 11/2015 AHI 54.  On autocpap  >> 10 cm    Peripheral edema 09/19/2013   rectal ca dx'd 02/01/13   rectal. Radiation and chemotherapy- remains on chemotherapy-next tx. 08-22-13.    S/P partial lobectomy of lung 11/20/2015   S/P thoracotomy    Sleep apnea    wears cpap    SUI (stress urinary incontinence), male 01/09/2016   Past Surgical History:  Procedure Laterality Date   A/V FISTULAGRAM Left 06/14/2018   Procedure: A/V FISTULAGRAM;  Surgeon: Waynetta Sandy, MD;  Location: [facility];  Service: Cardiovascular;  Laterality: Left;   AV FISTULA PLACEMENT Left 03/24/2016   Procedure:  CREATION OF LEFT BRACIOCEPHALIC ARTERIOVENOUS (AV) FISTULA;  Surgeon: Elam Dutch, MD;  Location: [facility];  Service: Vascular;  Laterality: Left;   BIOPSY  09/17/2018   Procedure: BIOPSY;  Surgeon: Doran Stabler, MD;  [facility];  Service: Gastroenterology;;   BIOPSY  03/09/2019   Procedure: BIOPSY;  Surgeon: Doran Stabler, MD;  [facility];  Service: Gastroenterology;;   BIOPSY  11/22/2019   Procedure: BIOPSY;  Surgeon: Doran Stabler, MD;  [facility];  Service: Gastroenterology;;   COLON SURGERY  05/20/2013   COLONOSCOPY  2010   [facility]   COLONOSCOPY WITH PROPOFOL N/A 09/17/2018   Procedure: COLONOSCOPY WITH PROPOFOL;  Surgeon: Doran Stabler, MD;  [facility];  Service: Gastroenterology;  Laterality: N/A;   COLONOSCOPY WITH PROPOFOL N/A 11/22/2019   Procedure: COLONOSCOPY WITH PROPOFOL;  Surgeon: Doran Stabler, MD;   [facility];  Service: Gastroenterology;  Laterality: N/A;  Hx of colon polyps tt    ESOPHAGOGASTRODUODENOSCOPY (EGD) WITH PROPOFOL N/A 09/17/2018   Procedure: ESOPHAGOGASTRODUODENOSCOPY (EGD) WITH PROPOFOL;  Surgeon: Doran Stabler, MD;  [facility];  Service: Gastroenterology;  Laterality: N/A;   ESOPHAGOGASTRODUODENOSCOPY (EGD) WITH PROPOFOL N/A 03/09/2019   Procedure: ESOPHAGOGASTRODUODENOSCOPY (EGD) WITH PROPOFOL;  Surgeon: Doran Stabler, MD;  [facility];  Service: Gastroenterology;  Laterality: N/A;   EUS N/A 02/03/2013   Procedure: LOWER ENDOSCOPIC ULTRASOUND (EUS);  Surgeon: Milus Banister, MD;  Location: [facility];  Service: Endoscopy;  Laterality: N/A;   HEMOSTASIS CLIP PLACEMENT  11/22/2019   Procedure: HEMOSTASIS CLIP PLACEMENT;  Surgeon: Doran Stabler, MD;  [facility];  Service: Gastroenterology;;   ILEOSTOMY CLOSURE N/A 08/24/2013   Procedure: CLOSURE OF LOOP ILEOSTOMY ;  Surgeon: Leighton Ruff, MD;  [facility];  Service: General;  Laterality: N/A;   IR ANGIO VERTEBRAL SEL SUBCLAVIAN INNOMINATE BILAT MOD SED  06/08/2018   IR CT HEAD LTD  06/08/2018   IR INTRAVSC STENT CERV CAROTID W/O EMB-PROT MOD SED INC ANGIO  06/08/2018   IR PERCUTANEOUS ART THROMBECTOMY/INFUSION INTRACRANIAL INC DIAG ANGIO  06/08/2018   LAPAROSCOPIC LOW ANTERIOR RESECTION N/A 05/20/2013   Procedure: LAPAROSCOPIC LOW ANTERIOR RESECTION WITH SPLENIC FLEXURE MOBILIZATION;  Surgeon: Leighton Ruff, MD;  [facility];  Service: General;  Laterality: N/A;   OSTOMY N/A 05/20/2013   Procedure: diverting OSTOMY;  Surgeon: Leighton Ruff, MD;  [facility];  Service: General;  Laterality: N/A;   PERIPHERAL VASCULAR INTERVENTION Left 06/14/2018   Procedure: PERIPHERAL VASCULAR INTERVENTION;  Surgeon: Waynetta Sandy, MD;  Location: [facility];  Service: Cardiovascular;  Laterality: Left;  AVF on the Left   POLYPECTOMY     POLYPECTOMY   11/22/2019   Procedure: POLYPECTOMY;  Surgeon: Doran Stabler, MD;  [facility];  Service: Gastroenterology;;   RADIOLOGY WITH ANESTHESIA N/A 06/08/2018   Procedure: IR WITH ANESTHESIA;  Surgeon: Radiologist, Medication, MD;  Location: [facility];  Service: Radiology;  Laterality: N/A;   UPPER GASTROINTESTINAL ENDOSCOPY     VIDEO ASSISTED THORACOSCOPY (VATS)/WEDGE RESECTION Left 11/20/2015   Procedure: VIDEO ASSISTED THORACOSCOPY (VATS)/LUNG RESECTION;  Surgeon: [name], MD;  Location: [facility];  Service: Thoracic;  Laterality: Left;   VIDEO BRONCHOSCOPY N/A 11/20/2015   Procedure: VIDEO BRONCHOSCOPY;  Surgeon: [name], MD;  Location: [facility];  Service: Thoracic;  Laterality: N/A;   Family History  Problem Relation Age of Onset  Stroke Mother    Hypertension Mother    Stroke Father    Hypertension Father    Heart disease Father    CVA Father    Hypertension Other    Colon cancer Neg Hx    Colon polyps Neg Hx    Esophageal cancer Neg Hx    Rectal cancer Neg Hx    Stomach cancer Neg Hx    Social History   Socioeconomic History   Marital status: Married    Spouse name: [name]   Number of children: 2   Years of education: 12   Highest education level: Not on file  Occupational History    Comment: retired Insurance account manager  Tobacco Use   Smoking status: Former Smoker    Packs/day: 0.50    Years: 10.00    Pack years: 5.00    Types: Cigarettes    Quit date: 07/07/1977    Years since quitting: 42.8   Smokeless tobacco: Never Used  Vaping Use   Vaping Use: Never used  Substance and Sexual Activity   Alcohol use: Not Currently    Alcohol/week: 0.0 standard drinks   Drug use: No   Sexual activity: Not Currently  Other Topics Concern   Not on file  Social History Narrative   Married to wife, [name]   Retired Insurance account manager w/[employer]   #2 grown children and #6 grandchildren   Social Determinants of Systems developer Strain: Low Risk    Difficulty of Paying Living Expenses: Not hard at all  Food Insecurity: No Food Insecurity   Worried About Charity fundraiser in the Last Year: Never true   Arboriculturist in the Last Year: Never true  Transportation Needs: No Transportation Needs   Lack of Transportation (Medical): No   Lack of Transportation (Non-Medical): No  Physical Activity: Sufficiently Active   Days of Exercise per Week: 5 days   Minutes of Exercise per Session: 30 min  Stress: No Stress Concern Present   Feeling of Stress : Not at all  Social Connections: Socially Integrated   Frequency of Communication with Friends and Family: More than three times a week   Frequency of Social Gatherings with Friends and Family: More than three times a week   Attends Religious Services: More than 4 times per year   Active Member of [employer] or Organizations: Not on file   Attends Music therapist: More than 4 times per year   Marital Status: Married    Tobacco Counseling Counseling given: Not Answered   Clinical Intake:  Pre-visit preparation completed: Yes  Pain : No/denies pain     Nutritional Risks: None Diabetes: No  How often do you need to have someone help you when you read instructions, pamphlets, or other written materials from your doctor or pharmacy?: 1 - Never What is the last grade level you completed in school?: HSG  Diabetic? no  Interpreter Needed?: No  Information entered by :: Somya Jauregui N. Tobechukwu Emmick, LPN   Activities of Daily Living In your present state of health, do you have any difficulty performing the following activities: 04/27/2020  Hearing? N  Vision? N  Difficulty concentrating or making decisions? N  Walking or climbing stairs? Y  Comment uses a walker  Dressing or bathing? N  Doing errands, shopping? Y  Preparing Food and eating ? N  Using the Toilet? N  In the past six months, have you accidently  leaked urine? N  Do you have  problems with loss of bowel control? N  Managing your Medications? N  Managing your Finances? N  Housekeeping or managing your Housekeeping? N  Some recent data might be hidden    Patient Care Team: [name], MD as PCP - General (Internal Medicine) Lafayette Dragon, MD (Inactive) as Attending Physician (Gastroenterology) Leighton Ruff, MD as Consulting Physician (General Surgery) [name], MD as Consulting Physician (Oncology) Elmarie Shiley, MD as Consulting Physician (Nephrology) Monna Fam, MD as Consulting Physician (Ophthalmology) Josue Hector, MD as Consulting Physician (Cardiology) Charlton Haws, [facility] as Pharmacist (Pharmacist) [facility] [place] any recent Medical Services you may have received from other than [facility] providers in the past year (date may be approximate).     Assessment:   This is a routine wellness examination for [place].  Hearing/Vision screen No exam data present  Dietary issues and exercise activities discussed: Current Exercise Habits: Home exercise routine, Type of exercise: walking;Other - see comments (rides stationary bike), Time (Minutes): 30, Frequency (Times/Week): 5, Weekly Exercise (Minutes/Week): 150, Intensity: Mild, Exercise limited by: cardiac condition(s);respiratory conditions(s)  Goals      Patient Stated     Continue to exercise and learn about diet for my kidneys. Enjoy life and go fishing as much as possible.     Patient Stated     Work towards gaining strength by increasing my physical activity and working with PT.      Pharmacy Care Plan     CARE PLAN ENTRY (see longitudinal plan of care for additional care plan information)  Current Barriers:  Chronic Disease Management support, education, and care coordination needs related to Hypertension, Hyperlipidemia, Atrial Fibrillation, and Heart Failure   Hypertension / Heart Failure BP Readings from Last 3 Encounters:  02/27/20 (!)  105/51  02/13/20 (!) 150/70  01/16/20 140/70  Pharmacist Clinical Goal(s): Over the next 90 days, patient will work with PharmD and providers to maintain BP goal <140/90 Current regimen:  Metoprolol succinate 25 mg daily Bidil 20-37.5 (isosorbide/hydralazine) mg 3 times daily Interventions: Discussed BP goals and benefits of medications for prevention of heart attack / stroke Patient self care activities - Over the next 90 days, patient will: Check BP daily, document, and provide at future appointments Ensure daily salt intake < 2300 mg/day  Hyperlipidemia / Hx stroke Lab Results  Component Value Date/Time   LDLCALC 44 12/12/2019 11:37 AM   LDLCALC 62 08/17/2018 08:16 AM   LDLDIRECT 120.0 06/04/2015 10:15 AM  Pharmacist Clinical Goal(s): Over the next 90 days, patient will work with PharmD and providers to maintain LDL goal < 70 Current regimen:  Atorvastatin 20 mg daily Interventions: Discussed cholesterol goals and benefits of medications for prevention of heart attack / stroke Patient self care activities - Over the next 90 days, patient will: Continue current medication  Atrial Fibrillation Pharmacist Clinical Goal(s) Over the next 90 days, patient will work with PharmD and providers to optimize therapy Current regimen:  Metoprolol succinate 25 mg daily Warfarin 5 mg as directed Interventions: Discussed benefits of medications and bleeding risk with warfarin Advised to avoid NSAIDs Patient self care activities - Over the next 90 days, patient will: Continue current medications  Medication management Pharmacist Clinical Goal(s): Over the next 90 days, patient will work with PharmD and providers to achieve optimal medication adherence Current pharmacy: [facility] (dialysis medications) Interventions Comprehensive medication review performed. Utilize [facility] pharmacy for medication synchronization,  packaging and delivery for non-dialysis  medications Patient self care activities - Over the next 90 days, patient will: Focus on medication adherence by pill pack Take medications as prescribed Report any questions or concerns to PharmD and/or provider(s)  Initial goal documentation       Depression Screen PHQ 2/9 Scores 04/27/2020 02/13/2020 04/15/2019 04/13/2019 07/13/2018 07/05/2018 04/12/2018  PHQ - 2 Score 0 0 1 0 0 0 0  PHQ- 9 Score - - - - - - -    Fall Risk Fall Risk  04/27/2020 02/13/2020 04/15/2019 11/10/2018 08/17/2018  Falls in the past year? 0 0 1 0 1  Number falls in past yr: 0 0 0 0 0  Injury with Fall? 0 0 0 0 1  Risk for fall due to : No Fall Risks No Fall Risks Impaired balance/gait;Impaired mobility - -  Follow up Falls evaluation completed Falls evaluation completed Falls prevention discussed;Education provided - -    Any stairs in or around the home? No  If so, are there any without handrails? No  Home free of loose throw rugs in walkways, pet beds, electrical cords, etc? Yes  Adequate lighting in your home to reduce risk of falls? Yes   ASSISTIVE DEVICES UTILIZED TO PREVENT FALLS:  Life alert? No  Use of a cane, walker or w/c? Yes  Grab bars in the bathroom? No  Shower chair or bench in shower? No  Elevated toilet seat or a handicapped toilet? No   TIMED UP AND GO:  Was the test performed? No .  Length of time to ambulate 10 feet: 0 sec.   Gait slow and steady with assistive device  Cognitive Function: MMSE - Mini Mental State Exam 04/12/2018 04/10/2017  Orientation to time 5 5  Orientation to Place 5 5  Registration 3 3  Attention/ Calculation 4 4  Recall 1 2  Language- name 2 objects 2 2  Language- repeat 1 1  Language- follow 3 step command 3 3  Language- read & follow direction 1 1  Write a sentence 1 1  Copy design 1 1  Total score 27 28        Immunizations Immunization History  Administered Date(s) Administered   Hepatitis B, adult 07/05/2018, 09/02/2018, 09/30/2018,  03/10/2019   Influenza Whole 05/15/2009, 06/05/2010   Influenza, High Dose Seasonal PF 04/27/2015, 04/29/2016, 04/10/2017, 04/12/2018, 03/31/2019   Influenza,inj,Quad PF,6+ Mos 07/14/2013, 04/10/2014   Moderna SARS-COVID-2 Vaccination 07/30/2019, 09/03/2019   Pneumococcal Conjugate-13 06/04/2015   Pneumococcal Polysaccharide-23 07/20/2012, 08/26/2013   Td 07/07/1997, 04/27/2008   Tdap 11/19/2016    TDAP status: Up to date Flu Vaccine status: Up to date Pneumococcal vaccine status: Up to date Covid-19 vaccine status: Completed vaccines  Qualifies for Shingles Vaccine? Yes   Zostavax completed No   Shingrix Completed?: No.    Education has been provided regarding the importance of this vaccine. Patient has been advised to call insurance company to determine out of pocket expense if they have not yet received this vaccine. Advised may also receive vaccine at local pharmacy or Health Dept. Verbalized acceptance and understanding.  Screening Tests Health Maintenance  Topic Date Due   INFLUENZA VACCINE  02/05/2020   COLONOSCOPY  11/21/2024   TETANUS/TDAP  11/20/2026   COVID-19 Vaccine  Completed   Hepatitis C Screening  Completed   PNA vac Low Risk Adult  Completed    Health Maintenance  Health Maintenance Due  Topic Date Due   INFLUENZA VACCINE  02/05/2020  Colorectal cancer screening: Completed 11/22/2019. Repeat every 5 years  Lung Cancer Screening: (Low Dose CT Chest recommended if Age 55-80 years, 30 pack-year currently smoking OR have quit w/in 15years.) does not qualify.   Lung Cancer Screening Referral: no  Additional Screening:  Hepatitis C Screening: does qualify; Completed yes  Vision Screening: Recommended annual ophthalmology exams for early detection of glaucoma and other disorders of the eye. Is the patient up to date with their annual eye exam?  Yes  Who is the provider or what is the name of the office in which the patient attends annual eye exams?  Monna Fam, MD If pt is not established with a provider, would they like to be referred to a provider to establish care? No .   Dental Screening: Recommended annual dental exams for proper oral hygiene  Community Resource Referral / Chronic Care Management: CRR required this visit?  No   CCM required this visit?  No      Plan:     I have personally reviewed and noted the following in the patient's chart:   Medical and social history Use of alcohol, tobacco or illicit drugs  Current medications and supplements Functional ability and status Nutritional status Physical activity Advanced directives List of other physicians Hospitalizations, surgeries, and ER visits in previous 12 months Vitals Screenings to include cognitive, depression, and falls Referrals and appointments  In addition, I have reviewed and discussed with patient certain preventive protocols, quality metrics, and best practice recommendations. A written personalized care plan for preventive services as well as general preventive health recommendations were provided to patient.     Sheral Flow, LPN   [id number]/09/[id number]   Nurse Notes:  Patient is cogitatively intact. There were no vitals filed for this visit. There is no height or weight on file to calculate BMI. Patient stated that he uses a walker for gait and balance; uses a c-pap machine for sleep apnea.  Medical screening examination/treatment/procedure(s) were performed by non-physician practitioner and as supervising physician I was immediately available for consultation/collaboration.  I agree with above. Lew Dawes, MD

## 2020-04-27 NOTE — Patient Instructions (Addendum)
[name] , Thank you for taking time to come for your Medicare Wellness Visit. I appreciate your ongoing commitment to your health goals. Please review the following plan we discussed and let me know if I can assist you in the future.   Screening recommendations/referrals: Colonoscopy: 11/22/2019; due every 5 years Recommended yearly ophthalmology/optometry visit for glaucoma screening and checkup Recommended yearly dental visit for hygiene and checkup  Vaccinations: Influenza vaccine: 03/31/2019 Pneumococcal vaccine: up to date Tdap vaccine: 11/19/2016 Shingles vaccine: never done   Covid-19: up to date  Advanced directives: Please bring a copy of your health care power of attorney and living will to the office at your convenience.  Conditions/risks identified: Yes. Reviewed health maintenance screenings with patient today and relevant education, vaccines, and/or referrals were provided. Continue doing brain stimulating activities (puzzles, reading, adult coloring books, staying active) to keep memory sharp. Continue to eat heart healthy diet (full of fruits, vegetables, whole grains, lean protein, water--limit salt, fat, and sugar intake) and increase physical activity as tolerated.   Next appointment: Please schedule your next Medicare Wellness Visit with your Nurse Health Advisor in 1 year by calling [phone].  Preventive Care 73 Years and Older, Male Preventive care refers to lifestyle choices and visits with your health care provider that can promote health and wellness. What does preventive care include?  A yearly physical exam. This is also called an annual well check.  Dental exams once or twice a year.  Routine eye exams. Ask your health care provider how often you should have your eyes checked.  Personal lifestyle choices, including:  Daily care of your teeth and gums.  Regular physical activity.  Eating a healthy diet.  Avoiding tobacco and drug use.  Limiting  alcohol use.  Practicing safe sex.  Taking low doses of aspirin every day.  Taking vitamin and mineral supplements as recommended by your health care provider. What happens during an annual well check? The services and screenings done by your health care provider during your annual well check will depend on your age, overall health, lifestyle risk factors, and family history of disease. Counseling  Your health care provider may ask you questions about your:  Alcohol use.  Tobacco use.  Drug use.  Emotional well-being.  Home and relationship well-being.  Sexual activity.  Eating habits.  History of falls.  Memory and ability to understand (cognition).  Work and work Statistician. Screening  You may have the following tests or measurements:  Height, weight, and BMI.  Blood pressure.  Lipid and cholesterol levels. These may be checked every 5 years, or more frequently if you are over 18 years old.  Skin check.  Lung cancer screening. You may have this screening every year starting at age 49 if you have a 30-pack-year history of smoking and currently smoke or have quit within the past 15 years.  Fecal occult blood test (FOBT) of the stool. You may have this test every year starting at age 76.  Flexible sigmoidoscopy or colonoscopy. You may have a sigmoidoscopy every 5 years or a colonoscopy every 10 years starting at age 3.  Prostate cancer screening. Recommendations will vary depending on your family history and other risks.  Hepatitis C blood test.  Hepatitis B blood test.  Sexually transmitted disease (STD) testing.  Diabetes screening. This is done by checking your blood sugar (glucose) after you have not eaten for a while (fasting). You may have this done every 1-3 years.  Abdominal aortic aneurysm (AAA)  screening. You may need this if you are a current or former smoker.  Osteoporosis. You may be screened starting at age 81 if you are at high risk. Talk  with your health care provider about your test results, treatment options, and if necessary, the need for more tests. Vaccines  Your health care provider may recommend certain vaccines, such as:  Influenza vaccine. This is recommended every year.  Tetanus, diphtheria, and acellular pertussis (Tdap, Td) vaccine. You may need a Td booster every 10 years.  Zoster vaccine. You may need this after age 65.  Pneumococcal 13-valent conjugate (PCV13) vaccine. One dose is recommended after age 71.  Pneumococcal polysaccharide (PPSV23) vaccine. One dose is recommended after age 46. Talk to your health care provider about which screenings and vaccines you need and how often you need them. This information is not intended to replace advice given to you by your health care provider. Make sure you discuss any questions you have with your health care provider. Document Released: 07/20/2015 Document Revised: 03/12/2016 Document Reviewed: 04/24/2015 Elsevier Interactive Patient Education  2017 [place] Prevention in the Home Falls can cause injuries. They can happen to people of all ages. There are many things you can do to make your home safe and to help prevent falls. What can I do on the outside of my home?  Regularly fix the edges of walkways and driveways and fix any cracks.  Remove anything that might make you trip as you walk through a door, such as a raised step or threshold.  Trim any bushes or trees on the path to your home.  Use bright outdoor lighting.  Clear any walking paths of anything that might make someone trip, such as rocks or tools.  Regularly check to see if handrails are loose or broken. Make sure that both sides of any steps have handrails.  Any raised decks and porches should have guardrails on the edges.  Have any leaves, snow, or ice cleared regularly.  Use sand or salt on walking paths during winter.  Clean up any spills in your garage right away. This  includes oil or grease spills. What can I do in the bathroom?  Use night lights.  Install grab bars by the toilet and in the tub and shower. Do not use towel bars as grab bars.  Use non-skid mats or decals in the tub or shower.  If you need to sit down in the shower, use a plastic, non-slip stool.  Keep the floor dry. Clean up any water that spills on the floor as soon as it happens.  Remove soap buildup in the tub or shower regularly.  Attach bath mats securely with double-sided non-slip rug tape.  Do not have throw rugs and other things on the floor that can make you trip. What can I do in the bedroom?  Use night lights.  Make sure that you have a light by your bed that is easy to reach.  Do not use any sheets or blankets that are too big for your bed. They should not hang down onto the floor.  Have a firm chair that has side arms. You can use this for support while you get dressed.  Do not have throw rugs and other things on the floor that can make you trip. What can I do in the kitchen?  Clean up any spills right away.  Avoid walking on wet floors.  Keep items that you use a lot in easy-to-reach  places.  If you need to reach something above you, use a strong step stool that has a grab bar.  Keep electrical cords out of the way.  Do not use floor polish or wax that makes floors slippery. If you must use wax, use non-skid floor wax.  Do not have throw rugs and other things on the floor that can make you trip. What can I do with my stairs?  Do not leave any items on the stairs.  Make sure that there are handrails on both sides of the stairs and use them. Fix handrails that are broken or loose. Make sure that handrails are as long as the stairways.  Check any carpeting to make sure that it is firmly attached to the stairs. Fix any carpet that is loose or worn.  Avoid having throw rugs at the top or bottom of the stairs. If you do have throw rugs, attach them to the  floor with carpet tape.  Make sure that you have a light switch at the top of the stairs and the bottom of the stairs. If you do not have them, ask someone to add them for you. What else can I do to help prevent falls?  Wear shoes that:  Do not have high heels.  Have rubber bottoms.  Are comfortable and fit you well.  Are closed at the toe. Do not wear sandals.  If you use a stepladder:  Make sure that it is fully opened. Do not climb a closed stepladder.  Make sure that both sides of the stepladder are locked into place.  Ask someone to hold it for you, if possible.  Clearly mark and make sure that you can see:  Any grab bars or handrails.  First and last steps.  Where the edge of each step is.  Use tools that help you move around (mobility aids) if they are needed. These include:  Canes.  Walkers.  Scooters.  Crutches.  Turn on the lights when you go into a dark area. Replace any light bulbs as soon as they burn out.  Set up your furniture so you have a clear path. Avoid moving your furniture around.  If any of your floors are uneven, fix them.  If there are any pets around you, be aware of where they are.  Review your medicines with your doctor. Some medicines can make you feel dizzy. This can increase your chance of falling. Ask your doctor what other things that you can do to help prevent falls. This information is not intended to replace advice given to you by your health care provider. Make sure you discuss any questions you have with your health care provider. Document Released: 04/19/2009 Document Revised: 11/29/2015 Document Reviewed: 07/28/2014 Elsevier Interactive Patient Education  2017 [employer].

## 2020-04-27 NOTE — Progress Notes (Signed)
Preop call completed with patient.  Patient on Coumadin for CAD.  Last dose 04/26/20.  PT/INR DOS.  Denies any chest pain, shortness of breath or any other cardiac related symptoms.

## 2020-04-28 DIAGNOSIS — Z992 Dependence on renal dialysis: Secondary | ICD-10-CM | POA: Diagnosis not present

## 2020-04-28 DIAGNOSIS — D509 Iron deficiency anemia, unspecified: Secondary | ICD-10-CM | POA: Diagnosis not present

## 2020-04-28 DIAGNOSIS — E876 Hypokalemia: Secondary | ICD-10-CM | POA: Diagnosis not present

## 2020-04-28 DIAGNOSIS — D689 Coagulation defect, unspecified: Secondary | ICD-10-CM | POA: Diagnosis not present

## 2020-04-28 DIAGNOSIS — R52 Pain, unspecified: Secondary | ICD-10-CM | POA: Diagnosis not present

## 2020-04-28 DIAGNOSIS — T8249XD Other complication of vascular dialysis catheter, subsequent encounter: Secondary | ICD-10-CM | POA: Diagnosis not present

## 2020-04-28 DIAGNOSIS — N2581 Secondary hyperparathyroidism of renal origin: Secondary | ICD-10-CM | POA: Diagnosis not present

## 2020-04-28 DIAGNOSIS — R197 Diarrhea, unspecified: Secondary | ICD-10-CM | POA: Diagnosis not present

## 2020-04-28 DIAGNOSIS — N186 End stage renal disease: Secondary | ICD-10-CM | POA: Diagnosis not present

## 2020-04-28 DIAGNOSIS — D631 Anemia in chronic kidney disease: Secondary | ICD-10-CM | POA: Diagnosis not present

## 2020-04-30 ENCOUNTER — Ambulatory Visit (HOSPITAL_COMMUNITY): Payer: Medicare Other | Admitting: Anesthesiology

## 2020-04-30 ENCOUNTER — Other Ambulatory Visit: Payer: Self-pay

## 2020-04-30 ENCOUNTER — Encounter (HOSPITAL_COMMUNITY): Payer: Self-pay | Admitting: Vascular Surgery

## 2020-04-30 ENCOUNTER — Encounter (HOSPITAL_COMMUNITY)
Admission: RE | Disposition: A | Discharge status: Home / Self Care | Payer: Self-pay | Source: Home / Self Care | Attending: Vascular Surgery

## 2020-04-30 ENCOUNTER — Ambulatory Visit (HOSPITAL_COMMUNITY)
Admission: RE | Admit: 2020-04-30 | Discharge: 2020-04-30 | Disposition: A | Discharge status: Home / Self Care | Payer: Medicare Other | Attending: Vascular Surgery | Admitting: Vascular Surgery

## 2020-04-30 DIAGNOSIS — G4730 Sleep apnea, unspecified: Secondary | ICD-10-CM | POA: Insufficient documentation

## 2020-04-30 DIAGNOSIS — I4891 Unspecified atrial fibrillation: Secondary | ICD-10-CM | POA: Insufficient documentation

## 2020-04-30 DIAGNOSIS — Z7901 Long term (current) use of anticoagulants: Secondary | ICD-10-CM | POA: Diagnosis not present

## 2020-04-30 DIAGNOSIS — Z992 Dependence on renal dialysis: Secondary | ICD-10-CM | POA: Insufficient documentation

## 2020-04-30 DIAGNOSIS — I10 Essential (primary) hypertension: Secondary | ICD-10-CM | POA: Insufficient documentation

## 2020-04-30 DIAGNOSIS — N289 Disorder of kidney and ureter, unspecified: Secondary | ICD-10-CM | POA: Diagnosis not present

## 2020-04-30 DIAGNOSIS — T82511A Breakdown (mechanical) of surgically created arteriovenous shunt, initial encounter: Secondary | ICD-10-CM | POA: Diagnosis present

## 2020-04-30 DIAGNOSIS — T82898A Other specified complication of vascular prosthetic devices, implants and grafts, initial encounter: Secondary | ICD-10-CM | POA: Diagnosis not present

## 2020-04-30 DIAGNOSIS — N186 End stage renal disease: Secondary | ICD-10-CM | POA: Diagnosis not present

## 2020-04-30 DIAGNOSIS — Z79899 Other long term (current) drug therapy: Secondary | ICD-10-CM | POA: Diagnosis not present

## 2020-04-30 DIAGNOSIS — I4811 Longstanding persistent atrial fibrillation: Secondary | ICD-10-CM | POA: Diagnosis not present

## 2020-04-30 DIAGNOSIS — M109 Gout, unspecified: Secondary | ICD-10-CM | POA: Diagnosis not present

## 2020-04-30 DIAGNOSIS — T82510A Breakdown (mechanical) of surgically created arteriovenous fistula, initial encounter: Secondary | ICD-10-CM | POA: Diagnosis not present

## 2020-04-30 DIAGNOSIS — Y832 Surgical operation with anastomosis, bypass or graft as the cause of abnormal reaction of the patient, or of later complication, without mention of misadventure at the time of the procedure: Secondary | ICD-10-CM | POA: Insufficient documentation

## 2020-04-30 DIAGNOSIS — E785 Hyperlipidemia, unspecified: Secondary | ICD-10-CM | POA: Diagnosis not present

## 2020-04-30 DIAGNOSIS — Z87891 Personal history of nicotine dependence: Secondary | ICD-10-CM | POA: Diagnosis not present

## 2020-04-30 DIAGNOSIS — Z8673 Personal history of transient ischemic attack (TIA), and cerebral infarction without residual deficits: Secondary | ICD-10-CM | POA: Diagnosis not present

## 2020-04-30 HISTORY — PX: REVISON OF ARTERIOVENOUS FISTULA: SHX6074

## 2020-04-30 LAB — POCT I-STAT, CHEM 8
BUN: 27 mg/dL — ABNORMAL HIGH (ref 8–23)
BUN: 41 mg/dL — ABNORMAL HIGH (ref 8–23)
Calcium, Ion: 0.84 mmol/L — CL (ref 1.15–1.40)
Calcium, Ion: 1.04 mmol/L — ABNORMAL LOW (ref 1.15–1.40)
Chloride: 98 mmol/L (ref 98–111)
Chloride: 99 mmol/L (ref 98–111)
Creatinine, Ser: 7.3 mg/dL — ABNORMAL HIGH (ref 0.61–1.24)
Creatinine, Ser: 7.5 mg/dL — ABNORMAL HIGH (ref 0.61–1.24)
Glucose, Bld: 77 mg/dL (ref 70–99)
Glucose, Bld: 80 mg/dL (ref 70–99)
HCT: 34 % — ABNORMAL LOW (ref 39.0–52.0)
HCT: 35 % — ABNORMAL LOW (ref 39.0–52.0)
Hemoglobin: 11.6 g/dL — ABNORMAL LOW (ref 13.0–17.0)
Hemoglobin: 11.9 g/dL — ABNORMAL LOW (ref 13.0–17.0)
Potassium: 3.6 mmol/L (ref 3.5–5.1)
Potassium: 5.6 mmol/L — ABNORMAL HIGH (ref 3.5–5.1)
Sodium: 134 mmol/L — ABNORMAL LOW (ref 135–145)
Sodium: 138 mmol/L (ref 135–145)
TCO2: 26 mmol/L (ref 22–32)
TCO2: 28 mmol/L (ref 22–32)

## 2020-04-30 LAB — PROTIME-INR
INR: 1.6 — ABNORMAL HIGH (ref 0.8–1.2)
Prothrombin Time: 18.2 seconds — ABNORMAL HIGH (ref 11.4–15.2)

## 2020-04-30 SURGERY — REVISON OF ARTERIOVENOUS FISTULA
Anesthesia: General | Site: Arm Upper | Laterality: Left

## 2020-04-30 MED ORDER — CEFAZOLIN SODIUM-DEXTROSE 2-4 GM/100ML-% IV SOLN
2.0000 g | INTRAVENOUS | Status: AC
  Administered 2020-04-30: 2 g via INTRAVENOUS
  Filled 2020-04-30: qty 100

## 2020-04-30 MED ORDER — FENTANYL CITRATE (PF) 250 MCG/5ML IJ SOLN
INTRAMUSCULAR | Status: AC
  Filled 2020-04-30: qty 5

## 2020-04-30 MED ORDER — SODIUM CHLORIDE 0.9 % IV SOLN
INTRAVENOUS | Status: DC | PRN
  Administered 2020-04-30: 15:00:00 500 mL

## 2020-04-30 MED ORDER — PROPOFOL 10 MG/ML IV BOLUS
INTRAVENOUS | Status: AC
  Filled 2020-04-30: qty 20

## 2020-04-30 MED ORDER — PHENYLEPHRINE 40 MCG/ML (10ML) SYRINGE FOR IV PUSH (FOR BLOOD PRESSURE SUPPORT)
PREFILLED_SYRINGE | INTRAVENOUS | Status: DC | PRN
  Administered 2020-04-30: 80 ug via INTRAVENOUS

## 2020-04-30 MED ORDER — PROTAMINE SULFATE 10 MG/ML IV SOLN
INTRAVENOUS | Status: DC | PRN
  Administered 2020-04-30: 50 mg via INTRAVENOUS

## 2020-04-30 MED ORDER — ORAL CARE MOUTH RINSE: 15.0000 mL | Freq: Once | OROMUCOSAL | Status: AC

## 2020-04-30 MED ORDER — LACTATED RINGERS IV SOLN: INTRAVENOUS | Status: DC | PRN

## 2020-04-30 MED ORDER — ONDANSETRON HCL 4 MG/2ML IJ SOLN: 4.0000 mg | Freq: Once | INTRAMUSCULAR | Status: DC | PRN

## 2020-04-30 MED ORDER — OXYCODONE HCL 5 MG/5ML PO SOLN: 5.0000 mg | Freq: Once | ORAL | Status: AC | PRN

## 2020-04-30 MED ORDER — CHLORHEXIDINE GLUCONATE 4 % EX LIQD: 60.0000 mL | Freq: Once | CUTANEOUS | Status: DC

## 2020-04-30 MED ORDER — 0.9 % SODIUM CHLORIDE (POUR BTL) OPTIME
TOPICAL | Status: DC | PRN
  Administered 2020-04-30: 1000 mL

## 2020-04-30 MED ORDER — SODIUM CHLORIDE 0.9 % IV SOLN: INTRAVENOUS | Status: DC

## 2020-04-30 MED ORDER — ONDANSETRON HCL 4 MG/2ML IJ SOLN
INTRAMUSCULAR | Status: DC | PRN
  Administered 2020-04-30: 4 mg via INTRAVENOUS

## 2020-04-30 MED ORDER — DEXAMETHASONE SODIUM PHOSPHATE 10 MG/ML IJ SOLN
INTRAMUSCULAR | Status: AC
  Filled 2020-04-30: qty 1

## 2020-04-30 MED ORDER — PHENYLEPHRINE HCL-NACL 10-0.9 MG/250ML-% IV SOLN
INTRAVENOUS | Status: DC | PRN
  Administered 2020-04-30: 20 ug/min via INTRAVENOUS

## 2020-04-30 MED ORDER — LIDOCAINE 2% (20 MG/ML) 5 ML SYRINGE
INTRAMUSCULAR | Status: DC | PRN
  Administered 2020-04-30: 100 mg via INTRAVENOUS

## 2020-04-30 MED ORDER — OXYCODONE HCL 5 MG PO TABS
ORAL_TABLET | ORAL | Status: AC
  Filled 2020-04-30: qty 1

## 2020-04-30 MED ORDER — LIDOCAINE HCL (PF) 1 % IJ SOLN
INTRAMUSCULAR | Status: DC | PRN
  Administered 2020-04-30: 30 mL

## 2020-04-30 MED ORDER — LIDOCAINE 2% (20 MG/ML) 5 ML SYRINGE
INTRAMUSCULAR | Status: AC
  Filled 2020-04-30: qty 5

## 2020-04-30 MED ORDER — ROCURONIUM BROMIDE 10 MG/ML (PF) SYRINGE
PREFILLED_SYRINGE | INTRAVENOUS | Status: AC
  Filled 2020-04-30: qty 10

## 2020-04-30 MED ORDER — EPHEDRINE SULFATE-NACL 50-0.9 MG/10ML-% IV SOSY
PREFILLED_SYRINGE | INTRAVENOUS | Status: DC | PRN
  Administered 2020-04-30 (×2): 5 mg via INTRAVENOUS

## 2020-04-30 MED ORDER — HEPARIN SODIUM (PORCINE) 1000 UNIT/ML IJ SOLN
INTRAMUSCULAR | Status: AC
  Filled 2020-04-30: qty 1

## 2020-04-30 MED ORDER — LIDOCAINE HCL (PF) 1 % IJ SOLN
INTRAMUSCULAR | Status: AC
  Filled 2020-04-30: qty 30

## 2020-04-30 MED ORDER — HYDROCODONE-ACETAMINOPHEN 5-325 MG PO TABS: 1.0000 | ORAL_TABLET | ORAL | 0 refills | Status: DC | PRN

## 2020-04-30 MED ORDER — ONDANSETRON HCL 4 MG/2ML IJ SOLN
INTRAMUSCULAR | Status: AC
  Filled 2020-04-30: qty 2

## 2020-04-30 MED ORDER — PROPOFOL 10 MG/ML IV BOLUS
INTRAVENOUS | Status: DC | PRN
  Administered 2020-04-30: 150 mg via INTRAVENOUS

## 2020-04-30 MED ORDER — PROTAMINE SULFATE 10 MG/ML IV SOLN
INTRAVENOUS | Status: AC
  Filled 2020-04-30: qty 5

## 2020-04-30 MED ORDER — FENTANYL CITRATE (PF) 100 MCG/2ML IJ SOLN
INTRAMUSCULAR | Status: AC
  Filled 2020-04-30: qty 2

## 2020-04-30 MED ORDER — OXYCODONE HCL 5 MG PO TABS
5.0000 mg | ORAL_TABLET | Freq: Once | ORAL | Status: AC | PRN
  Administered 2020-04-30: 5 mg via ORAL

## 2020-04-30 MED ORDER — FENTANYL CITRATE (PF) 100 MCG/2ML IJ SOLN
25.0000 ug | INTRAMUSCULAR | Status: DC | PRN
  Administered 2020-04-30 (×2): 25 ug via INTRAVENOUS

## 2020-04-30 MED ORDER — FENTANYL CITRATE (PF) 100 MCG/2ML IJ SOLN
INTRAMUSCULAR | Status: DC | PRN
  Administered 2020-04-30: 50 ug via INTRAVENOUS

## 2020-04-30 MED ORDER — HEPARIN SODIUM (PORCINE) 1000 UNIT/ML IJ SOLN
INTRAMUSCULAR | Status: DC | PRN
  Administered 2020-04-30: 5000 [IU] via INTRAVENOUS

## 2020-04-30 MED ORDER — SODIUM CHLORIDE 0.9 % IV SOLN
INTRAVENOUS | Status: AC
  Filled 2020-04-30: qty 1.2

## 2020-04-30 MED ORDER — CHLORHEXIDINE GLUCONATE 0.12 % MT SOLN
15.0000 mL | Freq: Once | OROMUCOSAL | Status: AC
  Administered 2020-04-30: 15 mL via OROMUCOSAL
  Filled 2020-04-30: qty 15

## 2020-04-30 SURGICAL SUPPLY — 37 items
ADH SKN CLS APL DERMABOND .7 (GAUZE/BANDAGES/DRESSINGS) ×1
AGENT HMST SPONGE THK3/8 (HEMOSTASIS)
ARMBAND PINK RESTRICT EXTREMIT (MISCELLANEOUS) ×2 IMPLANT
BNDG ELASTIC 4X5.8 VLCR STR LF (GAUZE/BANDAGES/DRESSINGS) ×2 IMPLANT
BNDG GAUZE ELAST 4 BULKY (GAUZE/BANDAGES/DRESSINGS) ×1 IMPLANT
CANISTER SUCT 3000ML PPV (MISCELLANEOUS) ×2 IMPLANT
CANNULA VESSEL 3MM 2 BLNT TIP (CANNULA) ×2 IMPLANT
CLIP VESOCCLUDE MED 6/CT (CLIP) ×2 IMPLANT
CLIP VESOCCLUDE SM WIDE 6/CT (CLIP) ×2 IMPLANT
COVER PROBE W GEL 5X96 (DRAPES) IMPLANT
COVER WAND RF STERILE (DRAPES) ×1 IMPLANT
DECANTER SPIKE VIAL GLASS SM (MISCELLANEOUS) ×2 IMPLANT
DERMABOND ADVANCED (GAUZE/BANDAGES/DRESSINGS) ×1
DERMABOND ADVANCED .7 DNX12 (GAUZE/BANDAGES/DRESSINGS) ×1 IMPLANT
DRAIN PENROSE 1/4X12 LTX STRL (WOUND CARE) ×2 IMPLANT
ELECT REM PT RETURN 9FT ADLT (ELECTROSURGICAL) ×2
ELECTRODE REM PT RTRN 9FT ADLT (ELECTROSURGICAL) ×1 IMPLANT
GLOVE BIO SURGEON STRL SZ7.5 (GLOVE) ×2 IMPLANT
GOWN STRL REUS W/ TWL LRG LVL3 (GOWN DISPOSABLE) ×3 IMPLANT
GOWN STRL REUS W/TWL LRG LVL3 (GOWN DISPOSABLE) ×6
HEMOSTAT SPONGE AVITENE ULTRA (HEMOSTASIS) IMPLANT
KIT BASIN OR (CUSTOM PROCEDURE TRAY) ×2 IMPLANT
KIT TURNOVER KIT B (KITS) ×2 IMPLANT
LOOP VESSEL MINI RED (MISCELLANEOUS) ×1 IMPLANT
NS IRRIG 1000ML POUR BTL (IV SOLUTION) ×2 IMPLANT
PACK CV ACCESS (CUSTOM PROCEDURE TRAY) ×2 IMPLANT
PAD ARMBOARD 7.5X6 YLW CONV (MISCELLANEOUS) ×4 IMPLANT
SUT PROLENE 5 0 C 1 24 (SUTURE) ×4 IMPLANT
SUT PROLENE 6 0 CC (SUTURE) ×3 IMPLANT
SUT PROLENE 7 0 BV 1 (SUTURE) IMPLANT
SUT VIC AB 3-0 SH 27 (SUTURE) ×2
SUT VIC AB 3-0 SH 27X BRD (SUTURE) ×1 IMPLANT
SUT VIC AB 4-0 PS2 18 (SUTURE) ×1 IMPLANT
SUT VICRYL 4-0 PS2 18IN ABS (SUTURE) ×1 IMPLANT
TOWEL GREEN STERILE (TOWEL DISPOSABLE) ×2 IMPLANT
UNDERPAD 30X36 HEAVY ABSORB (UNDERPADS AND DIAPERS) ×2 IMPLANT
WATER STERILE IRR 1000ML POUR (IV SOLUTION) ×2 IMPLANT

## 2020-04-30 NOTE — Transfer of Care (Signed)
Immediate Anesthesia Transfer of Care Note  Patient: Jason [name].  Procedure(s) Performed: PLICATION/REVISON OF LEFT BRACHIOCEPHALIC ARTERIOVENOUS FISTULA (Left Arm Upper)  Patient Location: PACU  Anesthesia Type:General  Level of Consciousness: drowsy  Airway & Oxygen Therapy: Patient Spontanous Breathing and Patient connected to face mask oxygen  Post-op Assessment: Report given to RN and Post -op Vital signs reviewed and stable  Post vital signs: Reviewed and stable  Last Vitals:  Vitals Value Taken Time  BP 130/60 04/30/20 1622  Temp 97.30F   Pulse 73   Resp 17 04/30/20 1627  SpO2    Vitals shown include unvalidated device data.  Last Pain:  Vitals:   04/30/20 1311  TempSrc:   PainSc: 0-No pain         Complications: No complications documented.

## 2020-04-30 NOTE — Discharge Instructions (Signed)
   Resume your Coumadin as prescribed beginning tomorrow, 05/01/2020      [facility]  Discharge Instructions  AV Fistula or Graft Surgery for Dialysis Access  Please refer to the following instructions for your post-procedure care. Your surgeon or physician assistant will discuss any changes with you.  Activity  You may drive the day following your surgery, if you are comfortable and no longer taking prescription pain medication. Resume full activity as the soreness in your incision resolves.  Bathing/Showering  You may shower after you go home. Keep your incision dry for 48 hours. Do not soak in a bathtub, hot tub, or swim until the incision heals completely. You may not shower if you have a hemodialysis catheter.  Incision Care  Clean your incision with mild soap and water after 48 hours. Pat the area dry with a clean towel. You do not need a bandage unless otherwise instructed. Do not apply any ointments or creams to your incision. You may have skin glue on your incision. Do not peel it off. It will come off on its own in about one week. Your arm may swell a bit after surgery. To reduce swelling use pillows to elevate your arm so it is above your heart. Your doctor will tell you if you need to lightly wrap your arm with an ACE bandage.  Diet  Resume your normal diet. There are not special food restrictions following this procedure. In order to heal from your surgery, it is CRITICAL to get adequate nutrition. Your body requires vitamins, minerals, and protein. Vegetables are the best source of vitamins and minerals. Vegetables also provide the perfect balance of protein. Processed food has little nutritional value, so try to avoid this.  Medications  Resume taking all of your medications. If your incision is causing pain, you may take over-the counter pain relievers such as acetaminophen (Tylenol). If you were prescribed a stronger pain medication,  please be aware these medications can cause nausea and constipation. Prevent nausea by taking the medication with a snack or meal. Avoid constipation by drinking plenty of fluids and eating foods with high amount of fiber, such as fruits, vegetables, and grains. Do not take Tylenol if you are taking prescription pain medications.     Follow up Your surgeon may want to see you in the office following your access surgery. If so, this will be arranged at the time of your surgery.  Please call us immediately for any of the following conditions:  Increased pain, redness, drainage (pus) from your incision site Fever of 101 degrees or higher Severe or worsening pain at your incision site Hand pain or numbness.  Reduce your risk of vascular disease:  Stop smoking. If you would like help, call [facility] at 1-800-QUIT-NOW (937)843-7438) or [place] at [place] your cholesterol Maintain a desired weight Control your diabetes Keep your blood pressure down  Dialysis  It will take several weeks to several months for your new dialysis access to be ready for use. Your surgeon will determine when it is OK to use it. Your nephrologist will continue to direct your dialysis. You can continue to use your Permcath until your new access is ready for use.  If you have any questions, please call the office at [phone].

## 2020-04-30 NOTE — Anesthesia Procedure Notes (Signed)
Procedure Name: LMA Insertion Date/Time: 04/30/2020 2:37 PM Performed by: Leonor Liv, CRNA Pre-anesthesia Checklist: Patient identified, Emergency Drugs available, Suction available and Patient being monitored Patient Re-evaluated:Patient Re-evaluated prior to induction Oxygen Delivery Method: Circle System Utilized Preoxygenation: Pre-oxygenation with 100% oxygen Induction Type: IV induction Ventilation: Mask ventilation without difficulty LMA: LMA inserted LMA Size: 4.0 Number of attempts: 1 Placement Confirmation: positive ETCO2 Tube secured with: Tape Dental Injury: Teeth and Oropharynx as per pre-operative assessment

## 2020-04-30 NOTE — Interval H&P Note (Signed)
History and Physical Interval Note:  04/30/2020 2:09 PM  Jason Cotta Sr.  has presented today for surgery, with the diagnosis of end stage renal disease.  The various methods of treatment have been discussed with the patient and family. After consideration of risks, benefits and other options for treatment, the patient has consented to  Procedure(s): PLICATION/REVISON OF LEFT BRACHIOCEPHALIC ARTERIOVENOUS FISTULA (Left) as a surgical intervention.  The patient's history has been reviewed, patient examined, no change in status, stable for surgery.  I have reviewed the patient's chart and labs.  Questions were answered to the patient's satisfaction.     Ruta Hinds

## 2020-04-30 NOTE — Anesthesia Preprocedure Evaluation (Signed)
Anesthesia Evaluation  Patient identified by MRN, date of birth, ID band Patient awake    Reviewed: Allergy & Precautions, NPO status , Patient's Chart, lab work & pertinent test results  Airway Mallampati: II  TM Distance: >3 FB Neck ROM: Full    Dental no notable dental hx.    Pulmonary sleep apnea and Continuous Positive Airway Pressure Ventilation , former smoker,    Pulmonary exam normal breath sounds clear to auscultation       Cardiovascular hypertension, + DOE  Normal cardiovascular exam+ dysrhythmias Atrial Fibrillation  Rhythm:Regular Rate:Normal     Neuro/Psych CVA negative psych ROS   GI/Hepatic negative GI ROS, Neg liver ROS,   Endo/Other  negative endocrine ROS  Renal/GU Renal InsufficiencyRenal disease  negative genitourinary   Musculoskeletal negative musculoskeletal ROS (+)   Abdominal   Peds negative pediatric ROS (+)  Hematology negative hematology ROS (+)   Anesthesia Other Findings   Reproductive/Obstetrics negative OB ROS                             Anesthesia Physical Anesthesia Plan  ASA: III  Anesthesia Plan: General   Post-op Pain Management:    Induction: Intravenous  PONV Risk Score and Plan: 2 and Ondansetron, Dexamethasone and Treatment may vary due to age or medical condition  Airway Management Planned: LMA  Additional Equipment:   Intra-op Plan:   Post-operative Plan: Extubation in OR  Informed Consent: I have reviewed the patients History and Physical, chart, labs and discussed the procedure including the risks, benefits and alternatives for the proposed anesthesia with the patient or authorized representative who has indicated his/her understanding and acceptance.     Dental advisory given  Plan Discussed with: CRNA and Surgeon  Anesthesia Plan Comments:         Anesthesia Quick Evaluation

## 2020-04-30 NOTE — Progress Notes (Signed)
Wasted 76mcg fentanyl with Cleotis Nipper, RN as witness.

## 2020-04-30 NOTE — Op Note (Signed)
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  Operative findings: Distal aneurysm plicated, skin excised over proximal aneurysm  Operative details: After team informed consent, the patient was taken the operating room.  The patient was placed in supine position operating table.  After induction general anesthesia and placement of a laryngeal mask, the patient's left upper extremities prepped and draped in usual sterile fashion.  Next an elliptical incision was made in the proximal aspect of the fistula over an aneurysmal area of degeneration and thinned out skin.  The incision was carried down through the subcutaneous tissues down the level to fistula.  I was able to dissect the skin completely off of the fistula without entering it and the fistula outer wall was of good thickness with no ulceration.  Subcutaneous tissues were reapproximated using running 3-0 Vicryl suture the skin was closed with a 4-0 Vicryl subcuticular stitch.  Next attention was turned to the more distal aneurysm in the upper arm.  A similar elliptical incision was made carried down through the subcutaneous tissues down the level of the fistula.  On this occasion however on excising the skin from the fistula it penetrated all the way up to the skin level and there was an open ulceration in the fistula.  The remainder of the skin was removed and the hole in the fistula was temporarily repaired with a 5-0 Prolene suture.  I then fully mobilized the fistula and this incision he gave the patient 5000 notes of intravenous heparin.  The fistula was controlled proximally and distally with clamps.  I then opened the aneurysmal segment longitudinally and then repaired this with a running 5-0 Prolene  suture.  This prior to completion of anastomosis it was for blood backbled and thoroughly flushed.  Anastomosis was completed clamps were removed there was good flow in the fistula immediately.  Hemostasis was obtained with 2 repair sutures.  The patient was then given 50 mg of protamine.  The subcutaneous tissues were reapproximated using running 3-0 Vicryl suture.  The skin was closed with a 4-0 Vicryl subcuticular stitch.  The patient tolerated procedure well and there were no complications.  Patient tolerated procedure well.  Patient was taken to recovery in stable condition.  Ruta Hinds, MD [facility] [place] Office: [phone]

## 2020-05-01 ENCOUNTER — Other Ambulatory Visit (HOSPITAL_COMMUNITY): Payer: Self-pay | Admitting: Interventional Radiology

## 2020-05-01 ENCOUNTER — Encounter (HOSPITAL_COMMUNITY): Payer: Self-pay | Admitting: Vascular Surgery

## 2020-05-01 DIAGNOSIS — T8249XD Other complication of vascular dialysis catheter, subsequent encounter: Secondary | ICD-10-CM | POA: Diagnosis not present

## 2020-05-01 DIAGNOSIS — E876 Hypokalemia: Secondary | ICD-10-CM | POA: Diagnosis not present

## 2020-05-01 DIAGNOSIS — R197 Diarrhea, unspecified: Secondary | ICD-10-CM | POA: Diagnosis not present

## 2020-05-01 DIAGNOSIS — N186 End stage renal disease: Secondary | ICD-10-CM | POA: Diagnosis not present

## 2020-05-01 DIAGNOSIS — D631 Anemia in chronic kidney disease: Secondary | ICD-10-CM | POA: Diagnosis not present

## 2020-05-01 DIAGNOSIS — I639 Cerebral infarction, unspecified: Secondary | ICD-10-CM

## 2020-05-01 DIAGNOSIS — Z992 Dependence on renal dialysis: Secondary | ICD-10-CM | POA: Diagnosis not present

## 2020-05-01 DIAGNOSIS — N2581 Secondary hyperparathyroidism of renal origin: Secondary | ICD-10-CM | POA: Diagnosis not present

## 2020-05-01 DIAGNOSIS — R52 Pain, unspecified: Secondary | ICD-10-CM | POA: Diagnosis not present

## 2020-05-01 DIAGNOSIS — D689 Coagulation defect, unspecified: Secondary | ICD-10-CM | POA: Diagnosis not present

## 2020-05-01 DIAGNOSIS — D509 Iron deficiency anemia, unspecified: Secondary | ICD-10-CM | POA: Diagnosis not present

## 2020-05-01 NOTE — Anesthesia Postprocedure Evaluation (Signed)
Anesthesia Post Note  Patient: [name]  Procedure([name]) Performed: PLICATION/REVISON OF LEFT BRACHIOCEPHALIC ARTERIOVENOUS FISTULA (Left Arm Upper)     Patient location during evaluation: PACU Anesthesia Type: General Level of consciousness: awake and alert Pain management: pain level controlled Vital Signs Assessment: post-procedure vital signs reviewed and stable Respiratory status: spontaneous breathing, nonlabored ventilation, respiratory function stable and patient connected to nasal cannula oxygen Cardiovascular status: blood pressure returned to baseline and stable Postop Assessment: no apparent nausea or vomiting Anesthetic complications: no   No complications documented.  Last Vitals:  Vitals:   04/30/20 1637 04/30/20 1652  BP: (!) 118/57 131/67  Pulse:  75  Resp: 15 20  Temp:    SpO2: 100% 100%    Last Pain:  Vitals:   04/30/20 1652  TempSrc:   PainSc: 6                  Jason [name]

## 2020-05-03 DIAGNOSIS — T8249XD Other complication of vascular dialysis catheter, subsequent encounter: Secondary | ICD-10-CM | POA: Diagnosis not present

## 2020-05-03 DIAGNOSIS — Z992 Dependence on renal dialysis: Secondary | ICD-10-CM | POA: Diagnosis not present

## 2020-05-03 DIAGNOSIS — R52 Pain, unspecified: Secondary | ICD-10-CM | POA: Diagnosis not present

## 2020-05-03 DIAGNOSIS — D631 Anemia in chronic kidney disease: Secondary | ICD-10-CM | POA: Diagnosis not present

## 2020-05-03 DIAGNOSIS — D509 Iron deficiency anemia, unspecified: Secondary | ICD-10-CM | POA: Diagnosis not present

## 2020-05-03 DIAGNOSIS — N2581 Secondary hyperparathyroidism of renal origin: Secondary | ICD-10-CM | POA: Diagnosis not present

## 2020-05-03 DIAGNOSIS — N186 End stage renal disease: Secondary | ICD-10-CM | POA: Diagnosis not present

## 2020-05-03 DIAGNOSIS — E876 Hypokalemia: Secondary | ICD-10-CM | POA: Diagnosis not present

## 2020-05-03 DIAGNOSIS — R197 Diarrhea, unspecified: Secondary | ICD-10-CM | POA: Diagnosis not present

## 2020-05-03 DIAGNOSIS — D689 Coagulation defect, unspecified: Secondary | ICD-10-CM | POA: Diagnosis not present

## 2020-05-03 NOTE — Telephone Encounter (Signed)
Pt's wife is requesting a call back from a nurse to discuss the scan the pt is to have next week.

## 2020-05-03 NOTE — Telephone Encounter (Signed)
Spoke with patient's wife regarding her concerns, she states that she received a call from someone telling patient to hold Coumadin last Sunday. Advised that it looks like he was told to hold Coumadin by Vein and vascular for a procedure that he had on the 25th for his fistula.  Discussed upcoming dototate scan again and she is aware that he does not have to be NPO.  Wife verbalized understanding and had no other concerns at the end of the call.

## 2020-05-05 DIAGNOSIS — N186 End stage renal disease: Secondary | ICD-10-CM | POA: Diagnosis not present

## 2020-05-05 DIAGNOSIS — N2581 Secondary hyperparathyroidism of renal origin: Secondary | ICD-10-CM | POA: Diagnosis not present

## 2020-05-05 DIAGNOSIS — Z992 Dependence on renal dialysis: Secondary | ICD-10-CM | POA: Diagnosis not present

## 2020-05-05 DIAGNOSIS — R197 Diarrhea, unspecified: Secondary | ICD-10-CM | POA: Diagnosis not present

## 2020-05-05 DIAGNOSIS — D631 Anemia in chronic kidney disease: Secondary | ICD-10-CM | POA: Diagnosis not present

## 2020-05-05 DIAGNOSIS — T8249XD Other complication of vascular dialysis catheter, subsequent encounter: Secondary | ICD-10-CM | POA: Diagnosis not present

## 2020-05-05 DIAGNOSIS — E876 Hypokalemia: Secondary | ICD-10-CM | POA: Diagnosis not present

## 2020-05-05 DIAGNOSIS — R52 Pain, unspecified: Secondary | ICD-10-CM | POA: Diagnosis not present

## 2020-05-05 DIAGNOSIS — D689 Coagulation defect, unspecified: Secondary | ICD-10-CM | POA: Diagnosis not present

## 2020-05-05 DIAGNOSIS — D509 Iron deficiency anemia, unspecified: Secondary | ICD-10-CM | POA: Diagnosis not present

## 2020-05-06 DIAGNOSIS — I10 Essential (primary) hypertension: Secondary | ICD-10-CM | POA: Diagnosis not present

## 2020-05-06 DIAGNOSIS — I129 Hypertensive chronic kidney disease with stage 1 through stage 4 chronic kidney disease, or unspecified chronic kidney disease: Secondary | ICD-10-CM | POA: Diagnosis not present

## 2020-05-06 DIAGNOSIS — Z992 Dependence on renal dialysis: Secondary | ICD-10-CM | POA: Diagnosis not present

## 2020-05-06 DIAGNOSIS — N186 End stage renal disease: Secondary | ICD-10-CM | POA: Diagnosis not present

## 2020-05-07 ENCOUNTER — Other Ambulatory Visit: Payer: Self-pay | Admitting: Internal Medicine

## 2020-05-07 ENCOUNTER — Ambulatory Visit (INDEPENDENT_AMBULATORY_CARE_PROVIDER_SITE_OTHER): Payer: Medicare Other | Admitting: General Practice

## 2020-05-07 ENCOUNTER — Other Ambulatory Visit: Payer: Self-pay

## 2020-05-07 DIAGNOSIS — Z7901 Long term (current) use of anticoagulants: Secondary | ICD-10-CM

## 2020-05-07 LAB — POCT INR: INR: 1.1 — AB (ref 2.0–3.0)

## 2020-05-07 NOTE — Patient Instructions (Addendum)
Pre visit review using our clinic review tool, if applicable. No additional management support is needed unless otherwise documented below in the visit note.  Take 1 tablet today (11/1) and take 1 1/2 tablets Tues, Wed and Thursday.  On Friday start taking 1 tablet daily except 1/2 tablet on Mondays only.  Re-check in 7 to 10 days.

## 2020-05-08 DIAGNOSIS — D631 Anemia in chronic kidney disease: Secondary | ICD-10-CM | POA: Diagnosis not present

## 2020-05-08 DIAGNOSIS — E876 Hypokalemia: Secondary | ICD-10-CM | POA: Diagnosis not present

## 2020-05-08 DIAGNOSIS — D509 Iron deficiency anemia, unspecified: Secondary | ICD-10-CM | POA: Diagnosis not present

## 2020-05-08 DIAGNOSIS — T8249XD Other complication of vascular dialysis catheter, subsequent encounter: Secondary | ICD-10-CM | POA: Diagnosis not present

## 2020-05-08 DIAGNOSIS — Z992 Dependence on renal dialysis: Secondary | ICD-10-CM | POA: Diagnosis not present

## 2020-05-08 DIAGNOSIS — N186 End stage renal disease: Secondary | ICD-10-CM | POA: Diagnosis not present

## 2020-05-08 DIAGNOSIS — D689 Coagulation defect, unspecified: Secondary | ICD-10-CM | POA: Diagnosis not present

## 2020-05-08 DIAGNOSIS — R197 Diarrhea, unspecified: Secondary | ICD-10-CM | POA: Diagnosis not present

## 2020-05-08 DIAGNOSIS — N2581 Secondary hyperparathyroidism of renal origin: Secondary | ICD-10-CM | POA: Diagnosis not present

## 2020-05-09 ENCOUNTER — Other Ambulatory Visit: Payer: Self-pay

## 2020-05-09 ENCOUNTER — Ambulatory Visit (HOSPITAL_COMMUNITY)
Admission: RE | Admit: 2020-05-09 | Discharge: 2020-05-09 | Disposition: A | Discharge status: Home / Self Care | Payer: Medicare Other | Source: Ambulatory Visit | Attending: Gastroenterology | Admitting: Gastroenterology

## 2020-05-09 DIAGNOSIS — K529 Noninfective gastroenteritis and colitis, unspecified: Secondary | ICD-10-CM

## 2020-05-09 DIAGNOSIS — R918 Other nonspecific abnormal finding of lung field: Secondary | ICD-10-CM | POA: Diagnosis not present

## 2020-05-09 DIAGNOSIS — R634 Abnormal weight loss: Secondary | ICD-10-CM | POA: Insufficient documentation

## 2020-05-09 DIAGNOSIS — R911 Solitary pulmonary nodule: Secondary | ICD-10-CM | POA: Diagnosis not present

## 2020-05-09 MED ORDER — COPPER CU 64 DOTATATE 1 MCI/ML IV SOLN
4.0000 | Freq: Once | INTRAVENOUS | Status: AC
  Administered 2020-05-09: 4.3 via INTRAVENOUS

## 2020-05-10 DIAGNOSIS — N2581 Secondary hyperparathyroidism of renal origin: Secondary | ICD-10-CM | POA: Diagnosis not present

## 2020-05-10 DIAGNOSIS — R197 Diarrhea, unspecified: Secondary | ICD-10-CM | POA: Diagnosis not present

## 2020-05-10 DIAGNOSIS — E876 Hypokalemia: Secondary | ICD-10-CM | POA: Diagnosis not present

## 2020-05-10 DIAGNOSIS — N186 End stage renal disease: Secondary | ICD-10-CM | POA: Diagnosis not present

## 2020-05-10 DIAGNOSIS — Z992 Dependence on renal dialysis: Secondary | ICD-10-CM | POA: Diagnosis not present

## 2020-05-10 DIAGNOSIS — D509 Iron deficiency anemia, unspecified: Secondary | ICD-10-CM | POA: Diagnosis not present

## 2020-05-10 DIAGNOSIS — D689 Coagulation defect, unspecified: Secondary | ICD-10-CM | POA: Diagnosis not present

## 2020-05-10 DIAGNOSIS — T8249XD Other complication of vascular dialysis catheter, subsequent encounter: Secondary | ICD-10-CM | POA: Diagnosis not present

## 2020-05-10 DIAGNOSIS — D631 Anemia in chronic kidney disease: Secondary | ICD-10-CM | POA: Diagnosis not present

## 2020-05-12 DIAGNOSIS — Z992 Dependence on renal dialysis: Secondary | ICD-10-CM | POA: Diagnosis not present

## 2020-05-12 DIAGNOSIS — D689 Coagulation defect, unspecified: Secondary | ICD-10-CM | POA: Diagnosis not present

## 2020-05-12 DIAGNOSIS — R197 Diarrhea, unspecified: Secondary | ICD-10-CM | POA: Diagnosis not present

## 2020-05-12 DIAGNOSIS — E876 Hypokalemia: Secondary | ICD-10-CM | POA: Diagnosis not present

## 2020-05-12 DIAGNOSIS — N186 End stage renal disease: Secondary | ICD-10-CM | POA: Diagnosis not present

## 2020-05-12 DIAGNOSIS — D509 Iron deficiency anemia, unspecified: Secondary | ICD-10-CM | POA: Diagnosis not present

## 2020-05-12 DIAGNOSIS — D631 Anemia in chronic kidney disease: Secondary | ICD-10-CM | POA: Diagnosis not present

## 2020-05-12 DIAGNOSIS — N2581 Secondary hyperparathyroidism of renal origin: Secondary | ICD-10-CM | POA: Diagnosis not present

## 2020-05-12 DIAGNOSIS — T8249XD Other complication of vascular dialysis catheter, subsequent encounter: Secondary | ICD-10-CM | POA: Diagnosis not present

## 2020-05-15 ENCOUNTER — Telehealth: Payer: Self-pay

## 2020-05-15 NOTE — Telephone Encounter (Signed)
Spoke with patient and patient's wife in regards to his [place] scan results. Pt states that he never started Rifaximin because it was too expensive, he reports that he still has 3 small episodes of diarrhea daily. Pt states that he is not currently taking Lomotil either, not certain how long he has been off of this. Please advise on any further recommendations, thank you.

## 2020-05-16 ENCOUNTER — Other Ambulatory Visit: Payer: Self-pay

## 2020-05-16 ENCOUNTER — Ambulatory Visit (INDEPENDENT_AMBULATORY_CARE_PROVIDER_SITE_OTHER): Payer: Medicare Other | Admitting: General Practice

## 2020-05-16 DIAGNOSIS — Z7901 Long term (current) use of anticoagulants: Secondary | ICD-10-CM

## 2020-05-16 DIAGNOSIS — I4891 Unspecified atrial fibrillation: Secondary | ICD-10-CM | POA: Diagnosis not present

## 2020-05-16 LAB — POCT INR: INR: 2.2 (ref 2.0–3.0)

## 2020-05-16 MED ORDER — DIPHENOXYLATE-ATROPINE 2.5-0.025 MG PO TABS: 1.0000 | ORAL_TABLET | Freq: Four times a day (QID) | ORAL | 2 refills | Status: DC | PRN

## 2020-05-16 NOTE — Telephone Encounter (Signed)
Lm on vm for patient to return call 

## 2020-05-16 NOTE — Progress Notes (Signed)
I have reviewed the results and agree with this plan   

## 2020-05-16 NOTE — Patient Instructions (Addendum)
Pre visit review using our clinic review tool, if applicable. No additional management support is needed unless otherwise documented below in the visit note.  Continue to take 1 tablet daily except 1/2 tablet on Mondays only.  Re-check in 4 weeks.

## 2020-05-16 NOTE — Telephone Encounter (Signed)
Spoke with patient's wife in regards to Dr. Loletha Carrow' recommendations. She is aware that a prescription has been sent in for Lomotil, advised that patient should take this instead of the Imodium, not in addition to it. Wife verbalized understanding of all information and had no other concerns at the end of the call.

## 2020-05-16 NOTE — Telephone Encounter (Addendum)
(  It has always been difficult to get a clear and consistent medication history from them)  Jason [name] told me at the recent visit that he goes through at least a bottle of Imodium a week.  So I will send a prescription for Lomotil to see if that works better for him. It would be to take instead of the imodium, not in addition to it.

## 2020-05-16 NOTE — Addendum Note (Signed)
Addended by: Nelida Meuse on: 05/16/2020 11:54 AM   Modules accepted: Orders

## 2020-05-17 DIAGNOSIS — D689 Coagulation defect, unspecified: Secondary | ICD-10-CM | POA: Diagnosis not present

## 2020-05-17 DIAGNOSIS — E876 Hypokalemia: Secondary | ICD-10-CM | POA: Diagnosis not present

## 2020-05-17 DIAGNOSIS — N186 End stage renal disease: Secondary | ICD-10-CM | POA: Diagnosis not present

## 2020-05-17 DIAGNOSIS — D631 Anemia in chronic kidney disease: Secondary | ICD-10-CM | POA: Diagnosis not present

## 2020-05-17 DIAGNOSIS — Z992 Dependence on renal dialysis: Secondary | ICD-10-CM | POA: Diagnosis not present

## 2020-05-17 DIAGNOSIS — D509 Iron deficiency anemia, unspecified: Secondary | ICD-10-CM | POA: Diagnosis not present

## 2020-05-17 DIAGNOSIS — T8249XD Other complication of vascular dialysis catheter, subsequent encounter: Secondary | ICD-10-CM | POA: Diagnosis not present

## 2020-05-17 DIAGNOSIS — R197 Diarrhea, unspecified: Secondary | ICD-10-CM | POA: Diagnosis not present

## 2020-05-17 DIAGNOSIS — N2581 Secondary hyperparathyroidism of renal origin: Secondary | ICD-10-CM | POA: Diagnosis not present

## 2020-05-19 DIAGNOSIS — E876 Hypokalemia: Secondary | ICD-10-CM | POA: Diagnosis not present

## 2020-05-19 DIAGNOSIS — R197 Diarrhea, unspecified: Secondary | ICD-10-CM | POA: Diagnosis not present

## 2020-05-19 DIAGNOSIS — D631 Anemia in chronic kidney disease: Secondary | ICD-10-CM | POA: Diagnosis not present

## 2020-05-19 DIAGNOSIS — Z992 Dependence on renal dialysis: Secondary | ICD-10-CM | POA: Diagnosis not present

## 2020-05-19 DIAGNOSIS — T8249XD Other complication of vascular dialysis catheter, subsequent encounter: Secondary | ICD-10-CM | POA: Diagnosis not present

## 2020-05-19 DIAGNOSIS — D689 Coagulation defect, unspecified: Secondary | ICD-10-CM | POA: Diagnosis not present

## 2020-05-19 DIAGNOSIS — N2581 Secondary hyperparathyroidism of renal origin: Secondary | ICD-10-CM | POA: Diagnosis not present

## 2020-05-19 DIAGNOSIS — D509 Iron deficiency anemia, unspecified: Secondary | ICD-10-CM | POA: Diagnosis not present

## 2020-05-19 DIAGNOSIS — N186 End stage renal disease: Secondary | ICD-10-CM | POA: Diagnosis not present

## 2020-05-22 DIAGNOSIS — Z992 Dependence on renal dialysis: Secondary | ICD-10-CM | POA: Diagnosis not present

## 2020-05-22 DIAGNOSIS — D509 Iron deficiency anemia, unspecified: Secondary | ICD-10-CM | POA: Diagnosis not present

## 2020-05-22 DIAGNOSIS — D631 Anemia in chronic kidney disease: Secondary | ICD-10-CM | POA: Diagnosis not present

## 2020-05-22 DIAGNOSIS — E876 Hypokalemia: Secondary | ICD-10-CM | POA: Diagnosis not present

## 2020-05-22 DIAGNOSIS — R197 Diarrhea, unspecified: Secondary | ICD-10-CM | POA: Diagnosis not present

## 2020-05-22 DIAGNOSIS — D689 Coagulation defect, unspecified: Secondary | ICD-10-CM | POA: Diagnosis not present

## 2020-05-22 DIAGNOSIS — N186 End stage renal disease: Secondary | ICD-10-CM | POA: Diagnosis not present

## 2020-05-22 DIAGNOSIS — T8249XD Other complication of vascular dialysis catheter, subsequent encounter: Secondary | ICD-10-CM | POA: Diagnosis not present

## 2020-05-22 DIAGNOSIS — N2581 Secondary hyperparathyroidism of renal origin: Secondary | ICD-10-CM | POA: Diagnosis not present

## 2020-05-24 DIAGNOSIS — T8249XD Other complication of vascular dialysis catheter, subsequent encounter: Secondary | ICD-10-CM | POA: Diagnosis not present

## 2020-05-24 DIAGNOSIS — R197 Diarrhea, unspecified: Secondary | ICD-10-CM | POA: Diagnosis not present

## 2020-05-24 DIAGNOSIS — Z992 Dependence on renal dialysis: Secondary | ICD-10-CM | POA: Diagnosis not present

## 2020-05-24 DIAGNOSIS — D689 Coagulation defect, unspecified: Secondary | ICD-10-CM | POA: Diagnosis not present

## 2020-05-24 DIAGNOSIS — N186 End stage renal disease: Secondary | ICD-10-CM | POA: Diagnosis not present

## 2020-05-24 DIAGNOSIS — D509 Iron deficiency anemia, unspecified: Secondary | ICD-10-CM | POA: Diagnosis not present

## 2020-05-24 DIAGNOSIS — E876 Hypokalemia: Secondary | ICD-10-CM | POA: Diagnosis not present

## 2020-05-24 DIAGNOSIS — N2581 Secondary hyperparathyroidism of renal origin: Secondary | ICD-10-CM | POA: Diagnosis not present

## 2020-05-24 DIAGNOSIS — D631 Anemia in chronic kidney disease: Secondary | ICD-10-CM | POA: Diagnosis not present

## 2020-05-26 DIAGNOSIS — D689 Coagulation defect, unspecified: Secondary | ICD-10-CM | POA: Diagnosis not present

## 2020-05-26 DIAGNOSIS — N2581 Secondary hyperparathyroidism of renal origin: Secondary | ICD-10-CM | POA: Diagnosis not present

## 2020-05-26 DIAGNOSIS — D509 Iron deficiency anemia, unspecified: Secondary | ICD-10-CM | POA: Diagnosis not present

## 2020-05-26 DIAGNOSIS — Z992 Dependence on renal dialysis: Secondary | ICD-10-CM | POA: Diagnosis not present

## 2020-05-26 DIAGNOSIS — D631 Anemia in chronic kidney disease: Secondary | ICD-10-CM | POA: Diagnosis not present

## 2020-05-26 DIAGNOSIS — E876 Hypokalemia: Secondary | ICD-10-CM | POA: Diagnosis not present

## 2020-05-26 DIAGNOSIS — T8249XD Other complication of vascular dialysis catheter, subsequent encounter: Secondary | ICD-10-CM | POA: Diagnosis not present

## 2020-05-26 DIAGNOSIS — R197 Diarrhea, unspecified: Secondary | ICD-10-CM | POA: Diagnosis not present

## 2020-05-26 DIAGNOSIS — N186 End stage renal disease: Secondary | ICD-10-CM | POA: Diagnosis not present

## 2020-05-28 DIAGNOSIS — T8249XD Other complication of vascular dialysis catheter, subsequent encounter: Secondary | ICD-10-CM | POA: Diagnosis not present

## 2020-05-28 DIAGNOSIS — N186 End stage renal disease: Secondary | ICD-10-CM | POA: Diagnosis not present

## 2020-05-28 DIAGNOSIS — D689 Coagulation defect, unspecified: Secondary | ICD-10-CM | POA: Diagnosis not present

## 2020-05-28 DIAGNOSIS — D509 Iron deficiency anemia, unspecified: Secondary | ICD-10-CM | POA: Diagnosis not present

## 2020-05-28 DIAGNOSIS — Z992 Dependence on renal dialysis: Secondary | ICD-10-CM | POA: Diagnosis not present

## 2020-05-28 DIAGNOSIS — N2581 Secondary hyperparathyroidism of renal origin: Secondary | ICD-10-CM | POA: Diagnosis not present

## 2020-05-28 DIAGNOSIS — E876 Hypokalemia: Secondary | ICD-10-CM | POA: Diagnosis not present

## 2020-05-28 DIAGNOSIS — D631 Anemia in chronic kidney disease: Secondary | ICD-10-CM | POA: Diagnosis not present

## 2020-05-28 DIAGNOSIS — R197 Diarrhea, unspecified: Secondary | ICD-10-CM | POA: Diagnosis not present

## 2020-05-29 ENCOUNTER — Other Ambulatory Visit: Payer: Self-pay

## 2020-05-29 ENCOUNTER — Ambulatory Visit (INDEPENDENT_AMBULATORY_CARE_PROVIDER_SITE_OTHER): Payer: Self-pay | Admitting: Physician Assistant

## 2020-05-29 VITALS — BP 114/61 | HR 84 | Temp 98.5°F | Wt 136.4 lb

## 2020-05-29 DIAGNOSIS — N186 End stage renal disease: Secondary | ICD-10-CM

## 2020-05-29 DIAGNOSIS — Z992 Dependence on renal dialysis: Secondary | ICD-10-CM

## 2020-05-29 NOTE — Progress Notes (Signed)
POST OPERATIVE OFFICE NOTE    CC:  F/u for surgery  HPI:  This is a 74 y.o. male who is s/p plication on the left arm AVF on 04/30/2020 by Dr. Oneida Alar and returns today for follow up.    Pt states he does not have pain/numbness in the left hand.  He states he is dialyzing via [facility] and it is working well.  He states that the fistula has been draining a little and he has been keeping a tegaderm over the incision.   The pt is on dialysis T/T/S at [place] location.   Allergies  Allergen Reactions  . Nsaids Other (See Comments)    Asked by surgeon to add this medication class as intolerance due to patients renal insufficiency.  . Lactase Diarrhea  . Lactose Intolerance (Gi) Diarrhea  . Phentermine Other (See Comments)    Unknown reaction  . Amlodipine Other (See Comments)    Edema     Current Outpatient Medications  Medication Sig Dispense Refill  . acetaminophen (TYLENOL) 325 MG tablet Take 650 mg by mouth every 4 (four) hours as needed for mild pain or moderate pain.     Marland Kitchen allopurinol (ZYLOPRIM) 100 MG tablet Take 1 tablet (100 mg total) by mouth daily. 90 tablet 1  . atorvastatin (LIPITOR) 20 MG tablet TAKE 1 TABLET BY MOUTH  DAILY AT 6 PM. (Patient taking differently: Take 20 mg by mouth every evening. ) 90 tablet 1  . BIDIL 20-37.5 MG tablet TAKE 1 TABLET BY MOUTH THREE TIMES DAILY (Patient taking differently: Take 1 tablet by mouth 3 (three) times daily. ) 90 tablet 0  . cinacalcet (SENSIPAR) 60 MG tablet Take 60 mg by mouth daily.    . diphenoxylate-atropine (LOMOTIL) 2.5-0.025 MG tablet Take 1 tablet by mouth 4 (four) times daily as needed for diarrhea or loose stools. 45 tablet 2  . FOSRENOL 1000 MG PACK Take 1,000 mg by mouth 3 (three) times daily with meals.     . gabapentin (NEURONTIN) 100 MG capsule TAKE 1 CAPSULE BY MOUTH THREE TIMES DAILY 90 capsule 0  . heparin 1000 unit/mL SOLN injection Heparin Sodium (Porcine) 1,000 Units/mL Catheter Lock Arterial    .  HYDROcodone-acetaminophen (NORCO/VICODIN) 5-325 MG tablet Take 1 tablet by mouth every 4 (four) hours as needed for moderate pain. 20 tablet 0  . lipase/protease/amylase (CREON) 36000 UNITS CPEP capsule Take 2 capsules (72,000 Units total) by mouth 3 (three) times daily with meals. May also take 1 capsule (36,000 Units total) as needed (with snacks). 240 capsule 11  . loperamide (IMODIUM) 2 MG capsule Take 2 mg by mouth as needed for diarrhea or loose stools.    . Methoxy PEG-Epoetin Beta (MIRCERA IJ) Mircera    . metoprolol succinate (TOPROL-XL) 25 MG 24 hr tablet Take 1 tablet (25 mg total) by mouth daily. 90 tablet 1  . polycarbophil (FIBERCON) 625 MG tablet Take 625 mg by mouth as needed for diarrhea or loose stools.     . warfarin (COUMADIN) 5 MG tablet TAKE 1 TABLET BY MOUTH DAILY, EXCEPT 1/2 TABLET ON MONDAY AND FRIDAYS OR TAKE AS DIRECTED BY ANTICOAGULATION CLINIC (Patient taking differently: Take 2.5-5 mg by mouth See admin instructions. Take 5 mg on Sun, Tue, Wed, Thurs, and Sat. Take 2.5 mg on Mon and Fri.) 90 tablet 1   No current facility-administered medications for this visit.     ROS:  See HPI  Physical Exam:  Today's Vitals   05/29/20 1206  BP: 114/61  Pulse: 84  Temp: 98.5 F (36.9 C)  TempSrc: Skin  SpO2: 99%  Weight: 136 lb 6.4 oz (61.9 kg)   Body mass index is 20.74 kg/m.   Incision:  Healing nicely with small area with scant clear drainage     Extremities:   There is a palpable left radial pulse.   Motor and sensory are in tact.   There is a thrill/bruit present.  The fistula/graft is easily palpable    Assessment/Plan:  This is a 74 y.o. male who is s/p: plication on the left arm AVF on 04/30/2020 by Dr. Oneida Alar  -the pt does not have evidence of steal. -his incision looks good with a couple of areas with scant clear drainage.  He has been leaving a tegaderm over the incision.  I have asked him to not use that dressing and use gauze instead and  change daily so there is no moisture over the incision.   -will have him follow up in a couple of weeks for wound check.  In the meantime, continue using TDC. -he was given some gauze for dressing today.   Leontine Locket, [facility] [phone]  Clinic MD:  Stanford Breed

## 2020-05-30 DIAGNOSIS — D631 Anemia in chronic kidney disease: Secondary | ICD-10-CM | POA: Diagnosis not present

## 2020-05-30 DIAGNOSIS — T8249XD Other complication of vascular dialysis catheter, subsequent encounter: Secondary | ICD-10-CM | POA: Diagnosis not present

## 2020-05-30 DIAGNOSIS — D509 Iron deficiency anemia, unspecified: Secondary | ICD-10-CM | POA: Diagnosis not present

## 2020-05-30 DIAGNOSIS — E876 Hypokalemia: Secondary | ICD-10-CM | POA: Diagnosis not present

## 2020-05-30 DIAGNOSIS — D689 Coagulation defect, unspecified: Secondary | ICD-10-CM | POA: Diagnosis not present

## 2020-05-30 DIAGNOSIS — R197 Diarrhea, unspecified: Secondary | ICD-10-CM | POA: Diagnosis not present

## 2020-05-30 DIAGNOSIS — N186 End stage renal disease: Secondary | ICD-10-CM | POA: Diagnosis not present

## 2020-05-30 DIAGNOSIS — N2581 Secondary hyperparathyroidism of renal origin: Secondary | ICD-10-CM | POA: Diagnosis not present

## 2020-05-30 DIAGNOSIS — Z992 Dependence on renal dialysis: Secondary | ICD-10-CM | POA: Diagnosis not present

## 2020-06-02 DIAGNOSIS — N186 End stage renal disease: Secondary | ICD-10-CM | POA: Diagnosis not present

## 2020-06-02 DIAGNOSIS — T8249XD Other complication of vascular dialysis catheter, subsequent encounter: Secondary | ICD-10-CM | POA: Diagnosis not present

## 2020-06-02 DIAGNOSIS — R197 Diarrhea, unspecified: Secondary | ICD-10-CM | POA: Diagnosis not present

## 2020-06-02 DIAGNOSIS — D689 Coagulation defect, unspecified: Secondary | ICD-10-CM | POA: Diagnosis not present

## 2020-06-02 DIAGNOSIS — E876 Hypokalemia: Secondary | ICD-10-CM | POA: Diagnosis not present

## 2020-06-02 DIAGNOSIS — D631 Anemia in chronic kidney disease: Secondary | ICD-10-CM | POA: Diagnosis not present

## 2020-06-02 DIAGNOSIS — D509 Iron deficiency anemia, unspecified: Secondary | ICD-10-CM | POA: Diagnosis not present

## 2020-06-02 DIAGNOSIS — Z992 Dependence on renal dialysis: Secondary | ICD-10-CM | POA: Diagnosis not present

## 2020-06-02 DIAGNOSIS — N2581 Secondary hyperparathyroidism of renal origin: Secondary | ICD-10-CM | POA: Diagnosis not present

## 2020-06-04 ENCOUNTER — Other Ambulatory Visit: Payer: Self-pay | Admitting: Cardiology

## 2020-06-04 DIAGNOSIS — I5022 Chronic systolic (congestive) heart failure: Secondary | ICD-10-CM

## 2020-06-05 ENCOUNTER — Ambulatory Visit: Payer: Medicare Other | Admitting: Gastroenterology

## 2020-06-05 DIAGNOSIS — I129 Hypertensive chronic kidney disease with stage 1 through stage 4 chronic kidney disease, or unspecified chronic kidney disease: Secondary | ICD-10-CM | POA: Diagnosis not present

## 2020-06-05 DIAGNOSIS — N186 End stage renal disease: Secondary | ICD-10-CM | POA: Diagnosis not present

## 2020-06-05 DIAGNOSIS — Z992 Dependence on renal dialysis: Secondary | ICD-10-CM | POA: Diagnosis not present

## 2020-06-05 DIAGNOSIS — T8249XD Other complication of vascular dialysis catheter, subsequent encounter: Secondary | ICD-10-CM | POA: Diagnosis not present

## 2020-06-05 DIAGNOSIS — N2581 Secondary hyperparathyroidism of renal origin: Secondary | ICD-10-CM | POA: Diagnosis not present

## 2020-06-05 DIAGNOSIS — D509 Iron deficiency anemia, unspecified: Secondary | ICD-10-CM | POA: Diagnosis not present

## 2020-06-05 DIAGNOSIS — D689 Coagulation defect, unspecified: Secondary | ICD-10-CM | POA: Diagnosis not present

## 2020-06-05 DIAGNOSIS — I10 Essential (primary) hypertension: Secondary | ICD-10-CM | POA: Diagnosis not present

## 2020-06-05 DIAGNOSIS — E876 Hypokalemia: Secondary | ICD-10-CM | POA: Diagnosis not present

## 2020-06-05 DIAGNOSIS — D631 Anemia in chronic kidney disease: Secondary | ICD-10-CM | POA: Diagnosis not present

## 2020-06-05 DIAGNOSIS — R197 Diarrhea, unspecified: Secondary | ICD-10-CM | POA: Diagnosis not present

## 2020-06-06 ENCOUNTER — Other Ambulatory Visit: Payer: Self-pay | Admitting: Gastroenterology

## 2020-06-06 NOTE — Telephone Encounter (Signed)
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  Dr. Corena Pilgrim CMA, Vivien Rota has sent refill of Lomotil

## 2020-06-06 NOTE — Telephone Encounter (Signed)
Pt is requesting a refill on his LOMOTIL, pt's pharmacy states no refills are showing in the system.  Pharmacy: [facility]

## 2020-06-06 NOTE — Telephone Encounter (Signed)
Pt calling again about the RX refill, please advise

## 2020-06-07 DIAGNOSIS — E876 Hypokalemia: Secondary | ICD-10-CM | POA: Diagnosis not present

## 2020-06-07 DIAGNOSIS — N2581 Secondary hyperparathyroidism of renal origin: Secondary | ICD-10-CM | POA: Diagnosis not present

## 2020-06-07 DIAGNOSIS — D509 Iron deficiency anemia, unspecified: Secondary | ICD-10-CM | POA: Diagnosis not present

## 2020-06-07 DIAGNOSIS — D689 Coagulation defect, unspecified: Secondary | ICD-10-CM | POA: Diagnosis not present

## 2020-06-07 DIAGNOSIS — N186 End stage renal disease: Secondary | ICD-10-CM | POA: Diagnosis not present

## 2020-06-07 DIAGNOSIS — R197 Diarrhea, unspecified: Secondary | ICD-10-CM | POA: Diagnosis not present

## 2020-06-07 DIAGNOSIS — T8249XD Other complication of vascular dialysis catheter, subsequent encounter: Secondary | ICD-10-CM | POA: Diagnosis not present

## 2020-06-07 DIAGNOSIS — D631 Anemia in chronic kidney disease: Secondary | ICD-10-CM | POA: Diagnosis not present

## 2020-06-07 DIAGNOSIS — Z992 Dependence on renal dialysis: Secondary | ICD-10-CM | POA: Diagnosis not present

## 2020-06-09 DIAGNOSIS — D509 Iron deficiency anemia, unspecified: Secondary | ICD-10-CM | POA: Diagnosis not present

## 2020-06-09 DIAGNOSIS — R197 Diarrhea, unspecified: Secondary | ICD-10-CM | POA: Diagnosis not present

## 2020-06-09 DIAGNOSIS — T8249XD Other complication of vascular dialysis catheter, subsequent encounter: Secondary | ICD-10-CM | POA: Diagnosis not present

## 2020-06-09 DIAGNOSIS — N186 End stage renal disease: Secondary | ICD-10-CM | POA: Diagnosis not present

## 2020-06-09 DIAGNOSIS — E876 Hypokalemia: Secondary | ICD-10-CM | POA: Diagnosis not present

## 2020-06-09 DIAGNOSIS — Z992 Dependence on renal dialysis: Secondary | ICD-10-CM | POA: Diagnosis not present

## 2020-06-09 DIAGNOSIS — D689 Coagulation defect, unspecified: Secondary | ICD-10-CM | POA: Diagnosis not present

## 2020-06-09 DIAGNOSIS — N2581 Secondary hyperparathyroidism of renal origin: Secondary | ICD-10-CM | POA: Diagnosis not present

## 2020-06-09 DIAGNOSIS — D631 Anemia in chronic kidney disease: Secondary | ICD-10-CM | POA: Diagnosis not present

## 2020-06-11 ENCOUNTER — Telehealth: Payer: Self-pay | Admitting: Pharmacist

## 2020-06-11 ENCOUNTER — Other Ambulatory Visit: Payer: Self-pay

## 2020-06-11 ENCOUNTER — Ambulatory Visit (INDEPENDENT_AMBULATORY_CARE_PROVIDER_SITE_OTHER): Payer: Medicare Other | Admitting: General Practice

## 2020-06-11 DIAGNOSIS — I4891 Unspecified atrial fibrillation: Secondary | ICD-10-CM

## 2020-06-11 DIAGNOSIS — Z7901 Long term (current) use of anticoagulants: Secondary | ICD-10-CM

## 2020-06-11 LAB — POCT INR: INR: 2 (ref 2.0–3.0)

## 2020-06-11 NOTE — Progress Notes (Addendum)
Chronic Care Management Pharmacy Assistant   Name: Jason CINA Sr.  MRN: [id number] DOB: 12/04/1945  Reason for Encounter: Medication Review  Patient Questions:  1.  Have you seen any other providers since your last visit? Yes, 04/04/20 Dr. Everlene Other  2.  Any changes in your medicines or health? Yes, Pt started ondansetron 4mg  and rifaximin 550mg  both discontinued on 04/30/20    PCP : [name], MD  Allergies:   Allergies  Allergen Reactions   Nsaids Other (See Comments)    Asked by surgeon to add this medication class as intolerance due to patients renal insufficiency.   Lactase Diarrhea   Lactose Intolerance (Gi) Diarrhea   Phentermine Other (See Comments)    Unknown reaction   Amlodipine Other (See Comments)    Edema     Medications: Outpatient Encounter Medications as of 06/11/2020  Medication Sig Note   acetaminophen (TYLENOL) 325 MG tablet Take 650 mg by mouth every 4 (four) hours as needed for mild pain or moderate pain.     allopurinol (ZYLOPRIM) 100 MG tablet Take 1 tablet (100 mg total) by mouth daily.    atorvastatin (LIPITOR) 20 MG tablet TAKE 1 TABLET BY MOUTH  DAILY AT 6 PM. (Patient taking differently: Take 20 mg by mouth every evening. )    BIDIL 20-37.5 MG tablet TAKE 1 TABLET BY MOUTH THREE TIMES DAILY    cinacalcet (SENSIPAR) 60 MG tablet Take 60 mg by mouth daily.    diphenoxylate-atropine (LOMOTIL) 2.5-0.025 MG tablet TAKE 1 TABLET BY MOUTH 4 TIMES DAILY AS NEEDED FOR  DIARRHEA  OR  LOOSE  STOOLS    FOSRENOL 1000 MG PACK Take 1,000 mg by mouth 3 (three) times daily with meals.     gabapentin (NEURONTIN) 100 MG capsule TAKE 1 CAPSULE BY MOUTH THREE TIMES DAILY    heparin 1000 unit/mL SOLN injection Heparin Sodium (Porcine) 1,000 Units/mL Catheter Lock Arterial    HYDROcodone-acetaminophen (NORCO/VICODIN) 5-325 MG tablet Take 1 tablet by mouth every 4 (four) hours as needed for moderate pain.    lipase/protease/amylase (CREON) 36000 UNITS  CPEP capsule Take 2 capsules (72,000 Units total) by mouth 3 (three) times daily with meals. May also take 1 capsule (36,000 Units total) as needed (with snacks). 04/03/2020: [facility] Hurt Assist PAP   loperamide (IMODIUM) 2 MG capsule Take 2 mg by mouth as needed for diarrhea or loose stools.    Methoxy PEG-Epoetin Beta (MIRCERA IJ) Mircera    metoprolol succinate (TOPROL-XL) 25 MG 24 hr tablet Take 1 tablet (25 mg total) by mouth daily.    polycarbophil (FIBERCON) 625 MG tablet Take 625 mg by mouth as needed for diarrhea or loose stools.     warfarin (COUMADIN) 5 MG tablet TAKE 1 TABLET BY MOUTH DAILY, EXCEPT 1/2 TABLET ON MONDAY AND FRIDAYS OR TAKE AS DIRECTED BY ANTICOAGULATION CLINIC (Patient taking differently: Take 2.5-5 mg by mouth See admin instructions. Take 5 mg on Sun, Tue, Wed, Thurs, and Sat. Take 2.5 mg on Mon and Fri.)    [DISCONTINUED] allopurinol (ZYLOPRIM) 100 MG tablet Take 1 tablet by mouth once daily (Patient taking differently: Take 100 mg by mouth daily. )    [DISCONTINUED] allopurinol (ZYLOPRIM) 100 MG tablet Take 1 tablet by mouth once daily (Patient taking differently: Taking it in morning)    [DISCONTINUED] atorvastatin (LIPITOR) 20 MG tablet TAKE 1 TABLET BY MOUTH  DAILY AT 6 PM.    No facility-administered encounter medications on file as  of 06/11/2020.    Current Diagnosis: Patient Active Problem List   Diagnosis Date Noted   Allergy, unspecified, initial encounter 03/01/2020   Anaphylactic shock, unspecified, initial encounter 03/01/2020   Viral upper respiratory tract infection 02/13/2020   Fever 01/16/2020   Acute prostatitis with hematuria 01/16/2020   Benign prostatic hyperplasia without lower urinary tract symptoms 12/12/2019   Meralgia paraesthetica, right 08/12/2019   Ataxia due to old cerebrovascular accident (CVA) 05/23/2019   Gait disturbance, post-stroke 04/17/2019   Iron deficiency anemia, unspecified 03/14/2019   Chronic obstructive pulmonary  disease, unspecified ([place]) 01/25/2019   Coagulation defect, unspecified ([place]) 01/25/2019   Diarrhea, unspecified 01/25/2019   Hypertensive chronic kidney disease with stage 5 chronic kidney disease or end stage renal disease ([place]) 01/25/2019   Diverticulosis of intestine, part unspecified, without perforation or abscess without bleeding 01/25/2019   Other idiopathic peripheral autonomic neuropathy 01/25/2019   Long term (current) use of anticoagulants 12/31/2018   Atrial fibrillation, unspecified type ([place]) 12/22/2018   Longstanding persistent atrial fibrillation ([place]) 09/20/2018   Functional diarrhea 07/14/2018   Benign essential HTN    ESRD on dialysis ([place]) 06/16/2018   Mild malnutrition ([place]) 06/16/2018   Stroke due to embolism ([place]) 06/16/2018   CVA (cerebral vascular accident) ([place]) 06/08/2018   CHF (congestive heart failure) ([place]) 05/08/2018   Hyperlipidemia LDL goal <70 09/23/2017   OAB (overactive bladder) 01/09/2016   SUI (stress urinary incontinence), male 01/09/2016   Hypersomnia 10/30/2015   Obesity (BMI 30.0-34.9) 10/30/2015   Routine general medical examination at a health care facility 06/04/2015   Medicare annual wellness visit, subsequent 06/04/2015   Hereditary and idiopathic peripheral neuropathy 09/14/2014   Rectal cancer ([place]) 02/01/2013   Obstructive sleep apnea 07/29/2010   GOUT 05/11/2007   ERECTILE DYSFUNCTION, MILD 05/11/2007    Goals Addressed   None     Follow-Up:  Coordination of Enhanced Pharmacy Services    Reviewed chart for medication changes ahead of medication coordination call.  04/04/2020 DR. Wilfrid Lund Gastro Pt started ondansetron 4mg  discontinued 04/30/2020. Started rifaximin 550mg  2 times daily discontinued on 04/30/2020.  BP Readings from Last 3 Encounters:  05/29/20 114/61  04/30/20 131/67  04/25/20 (!) 102/58    Lab Results  Component Value Date   HGBA1C 5.5 06/09/2018     Patient obtains medications through Vials  30  Days   Last adherence delivery included:   Metoprolol 25mg   Warfarin 5mg   Patient is due for next adherence delivery on: 06/12/2020 Called patient and reviewed medications and coordinated delivery.  This delivery to include:  Allopurinol 100mg  1 tablet by mouth daily Atorvastatin 20mg  1 tablet by mouth daily at 6pm Metoprolol Succinate 25mg  1 tablet by mouth daily  Confirmed delivery date of 06/12/2020, advised patient that pharmacy will contact them the morning of delivery.  Rosendo Gros, [facility]  Practice Team Manager/ CPA (Clinical Pharmacist Assistant) [phone]

## 2020-06-11 NOTE — Patient Instructions (Signed)
Pre visit review using our clinic review tool, if applicable. No additional management support is needed unless otherwise documented below in the visit note.  Continue to take 1 tablet daily except 1/2 tablet on Mondays only.  Re-check in 4 weeks.

## 2020-06-12 ENCOUNTER — Other Ambulatory Visit: Payer: Self-pay | Admitting: Internal Medicine

## 2020-06-12 DIAGNOSIS — E785 Hyperlipidemia, unspecified: Secondary | ICD-10-CM

## 2020-06-12 DIAGNOSIS — N2581 Secondary hyperparathyroidism of renal origin: Secondary | ICD-10-CM | POA: Diagnosis not present

## 2020-06-12 DIAGNOSIS — Z992 Dependence on renal dialysis: Secondary | ICD-10-CM | POA: Diagnosis not present

## 2020-06-12 DIAGNOSIS — T8249XD Other complication of vascular dialysis catheter, subsequent encounter: Secondary | ICD-10-CM | POA: Diagnosis not present

## 2020-06-12 DIAGNOSIS — D509 Iron deficiency anemia, unspecified: Secondary | ICD-10-CM | POA: Diagnosis not present

## 2020-06-12 DIAGNOSIS — N186 End stage renal disease: Secondary | ICD-10-CM | POA: Diagnosis not present

## 2020-06-12 DIAGNOSIS — D689 Coagulation defect, unspecified: Secondary | ICD-10-CM | POA: Diagnosis not present

## 2020-06-12 DIAGNOSIS — E876 Hypokalemia: Secondary | ICD-10-CM | POA: Diagnosis not present

## 2020-06-12 DIAGNOSIS — D631 Anemia in chronic kidney disease: Secondary | ICD-10-CM | POA: Diagnosis not present

## 2020-06-12 DIAGNOSIS — R197 Diarrhea, unspecified: Secondary | ICD-10-CM | POA: Diagnosis not present

## 2020-06-12 MED ORDER — ATORVASTATIN CALCIUM 20 MG PO TABS: 20.0000 mg | ORAL_TABLET | Freq: Every evening | ORAL | 1 refills | Status: DC

## 2020-06-13 ENCOUNTER — Telehealth: Payer: Self-pay | Admitting: Gastroenterology

## 2020-06-13 ENCOUNTER — Other Ambulatory Visit: Payer: Self-pay

## 2020-06-13 ENCOUNTER — Telehealth: Payer: Self-pay | Admitting: Pharmacist

## 2020-06-13 ENCOUNTER — Ambulatory Visit (INDEPENDENT_AMBULATORY_CARE_PROVIDER_SITE_OTHER): Payer: Self-pay | Admitting: Physician Assistant

## 2020-06-13 VITALS — BP 108/56 | HR 74 | Temp 98.9°F | Resp 20 | Ht 68.0 in | Wt 140.5 lb

## 2020-06-13 DIAGNOSIS — Z992 Dependence on renal dialysis: Secondary | ICD-10-CM

## 2020-06-13 DIAGNOSIS — N186 End stage renal disease: Secondary | ICD-10-CM

## 2020-06-13 MED ORDER — ONDANSETRON HCL 4 MG PO TABS
4.0000 mg | ORAL_TABLET | Freq: Three times a day (TID) | ORAL | 1 refills | Status: DC | PRN
Start: 2020-06-13 — End: 2020-07-16

## 2020-06-13 NOTE — Telephone Encounter (Signed)
[facility] pharmacy called stating patient would like to have Lomotil and Zofran scripts sent there. Patient informed them he believes he had a hard copy of the prescription but lost it.

## 2020-06-13 NOTE — Telephone Encounter (Signed)
Zofran has been filled and sent to [facility]. The Lomotil was just filled and sent [facility] on 06-06-2020 as patient requested

## 2020-06-13 NOTE — Progress Notes (Signed)
Spoke with Wyatt Portela at Dr. Loletha Carrow office and requested refills for Lomotil and Ondansetron to be sent to the pharmacy

## 2020-06-13 NOTE — Progress Notes (Signed)
POST OPERATIVE OFFICE NOTE    CC:  F/u for surgery  HPI:  This is a 74 y.o. male who is s/p plication and revision AV fistula left UE on 04/30/2020 by Dr. Oneida Alar.  He has a previously placed [facility] that is currently in use for HD.    Pt returns today for follow up.   He denise fever and chills.  He denise symptoms of steal in the left UE.      Allergies  Allergen Reactions  . Nsaids Other (See Comments)    Asked by surgeon to add this medication class as intolerance due to patients renal insufficiency.  . Lactase Diarrhea  . Lactose Intolerance (Gi) Diarrhea  . Phentermine Other (See Comments)    Unknown reaction  . Amlodipine Other (See Comments)    Edema     Current Outpatient Medications  Medication Sig Dispense Refill  . acetaminophen (TYLENOL) 325 MG tablet Take 650 mg by mouth every 4 (four) hours as needed for mild pain or moderate pain.     Marland Kitchen allopurinol (ZYLOPRIM) 100 MG tablet Take 1 tablet (100 mg total) by mouth daily. 90 tablet 1  . atorvastatin (LIPITOR) 20 MG tablet Take 1 tablet (20 mg total) by mouth every evening. 90 tablet 1  . BIDIL 20-37.5 MG tablet TAKE 1 TABLET BY MOUTH THREE TIMES DAILY 90 tablet 0  . cinacalcet (SENSIPAR) 60 MG tablet Take 60 mg by mouth daily.    . diphenoxylate-atropine (LOMOTIL) 2.5-0.025 MG tablet TAKE 1 TABLET BY MOUTH 4 TIMES DAILY AS NEEDED FOR  DIARRHEA  OR  LOOSE  STOOLS 45 tablet 0  . doxercalciferol (HECTOROL) 4 MCG/2ML injection Doxercalciferol (Hectorol)    . FOSRENOL 1000 MG PACK Take 1,000 mg by mouth 3 (three) times daily with meals.     . gabapentin (NEURONTIN) 100 MG capsule TAKE 1 CAPSULE BY MOUTH THREE TIMES DAILY 90 capsule 0  . heparin 1000 unit/mL SOLN injection Heparin Sodium (Porcine) 1,000 Units/mL Catheter Lock Arterial    . HYDROcodone-acetaminophen (NORCO/VICODIN) 5-325 MG tablet Take 1 tablet by mouth every 4 (four) hours as needed for moderate pain. 20 tablet 0  . iron sucrose in sodium chloride 0.9 % 100 mL  Iron Sucrose (Venofer)    . lipase/protease/amylase (CREON) 36000 UNITS CPEP capsule Take 2 capsules (72,000 Units total) by mouth 3 (three) times daily with meals. May also take 1 capsule (36,000 Units total) as needed (with snacks). 240 capsule 11  . loperamide (IMODIUM) 2 MG capsule Take 2 mg by mouth as needed for diarrhea or loose stools.    . Methoxy PEG-Epoetin Beta (MIRCERA IJ) Mircera    . metoprolol succinate (TOPROL-XL) 25 MG 24 hr tablet Take 1 tablet (25 mg total) by mouth daily. 90 tablet 1  . ondansetron (ZOFRAN) 4 MG tablet Take 4 mg by mouth every 8 (eight) hours as needed.    . polycarbophil (FIBERCON) 625 MG tablet Take 625 mg by mouth as needed for diarrhea or loose stools.     . warfarin (COUMADIN) 5 MG tablet TAKE 1 TABLET BY MOUTH DAILY, EXCEPT 1/2 TABLET ON MONDAY AND FRIDAYS OR TAKE AS DIRECTED BY ANTICOAGULATION CLINIC (Patient taking differently: Take 2.5-5 mg by mouth See admin instructions. Take 5 mg on Sun, Tue, Wed, Thurs, and Sat. Take 2.5 mg on Mon and Fri.) 90 tablet 1   No current facility-administered medications for this visit.     ROS:  See HPI  Physical Exam:    Incision:  Left UE incisions healing well without erythema or draiange Extremities:  Grip 5/5, sensation intact as well as radial pulse Lungs: non labored   Assessment/Plan:  This is a 74 y.o. male who is s/p: left AV fistula plication/revision  This was performed 04/30/20.  The fistula may be accessed 06/14/20.  F/U PRN.     Roxy Horseman PA-C [facility] [phone]  Clinic MD:  Donzetta Matters

## 2020-06-13 NOTE — Telephone Encounter (Signed)
-----   Message from Charlton Haws, [facility] sent at 06/13/2020  1:58 PM EST ----- Regarding: Call MD office Please call Dr Loletha Carrow (GI): [phone] and request refills (e-scribe) for Lomotil and Ondansetron to be sent to [facility].  They ordered Lomotil on 12/1 but it was a printed rx, not e-scribed, and patient says he does not have the hard copy

## 2020-06-14 DIAGNOSIS — E876 Hypokalemia: Secondary | ICD-10-CM | POA: Diagnosis not present

## 2020-06-14 DIAGNOSIS — Z992 Dependence on renal dialysis: Secondary | ICD-10-CM | POA: Diagnosis not present

## 2020-06-14 DIAGNOSIS — N186 End stage renal disease: Secondary | ICD-10-CM | POA: Diagnosis not present

## 2020-06-14 DIAGNOSIS — T8249XD Other complication of vascular dialysis catheter, subsequent encounter: Secondary | ICD-10-CM | POA: Diagnosis not present

## 2020-06-14 DIAGNOSIS — D631 Anemia in chronic kidney disease: Secondary | ICD-10-CM | POA: Diagnosis not present

## 2020-06-14 DIAGNOSIS — R197 Diarrhea, unspecified: Secondary | ICD-10-CM | POA: Diagnosis not present

## 2020-06-14 DIAGNOSIS — D509 Iron deficiency anemia, unspecified: Secondary | ICD-10-CM | POA: Diagnosis not present

## 2020-06-14 DIAGNOSIS — D689 Coagulation defect, unspecified: Secondary | ICD-10-CM | POA: Diagnosis not present

## 2020-06-14 DIAGNOSIS — N2581 Secondary hyperparathyroidism of renal origin: Secondary | ICD-10-CM | POA: Diagnosis not present

## 2020-06-15 ENCOUNTER — Ambulatory Visit: Payer: Medicare Other

## 2020-06-15 ENCOUNTER — Encounter (HOSPITAL_COMMUNITY): Payer: Self-pay

## 2020-06-15 ENCOUNTER — Ambulatory Visit (HOSPITAL_COMMUNITY)
Admission: RE | Admit: 2020-06-15 | Discharge: 2020-06-15 | Disposition: A | Discharge status: Home / Self Care | Payer: Medicare Other | Source: Ambulatory Visit | Attending: Interventional Radiology | Admitting: Interventional Radiology

## 2020-06-15 ENCOUNTER — Other Ambulatory Visit: Payer: Self-pay

## 2020-06-15 ENCOUNTER — Ambulatory Visit (HOSPITAL_COMMUNITY): Admission: RE | Admit: 2020-06-15 | Payer: Medicare Other | Source: Ambulatory Visit

## 2020-06-15 DIAGNOSIS — Z8673 Personal history of transient ischemic attack (TIA), and cerebral infarction without residual deficits: Secondary | ICD-10-CM | POA: Diagnosis not present

## 2020-06-15 DIAGNOSIS — I672 Cerebral atherosclerosis: Secondary | ICD-10-CM | POA: Diagnosis not present

## 2020-06-15 DIAGNOSIS — I639 Cerebral infarction, unspecified: Secondary | ICD-10-CM | POA: Diagnosis not present

## 2020-06-15 DIAGNOSIS — I6522 Occlusion and stenosis of left carotid artery: Secondary | ICD-10-CM | POA: Diagnosis not present

## 2020-06-15 DIAGNOSIS — I6602 Occlusion and stenosis of left middle cerebral artery: Secondary | ICD-10-CM | POA: Diagnosis not present

## 2020-06-15 MED ORDER — IOHEXOL 350 MG/ML SOLN
75.0000 mL | Freq: Once | INTRAVENOUS | Status: AC | PRN
  Administered 2020-06-15: 75 mL via INTRAVENOUS

## 2020-06-16 DIAGNOSIS — T8249XD Other complication of vascular dialysis catheter, subsequent encounter: Secondary | ICD-10-CM | POA: Diagnosis not present

## 2020-06-16 DIAGNOSIS — Z992 Dependence on renal dialysis: Secondary | ICD-10-CM | POA: Diagnosis not present

## 2020-06-16 DIAGNOSIS — N2581 Secondary hyperparathyroidism of renal origin: Secondary | ICD-10-CM | POA: Diagnosis not present

## 2020-06-16 DIAGNOSIS — D509 Iron deficiency anemia, unspecified: Secondary | ICD-10-CM | POA: Diagnosis not present

## 2020-06-16 DIAGNOSIS — D689 Coagulation defect, unspecified: Secondary | ICD-10-CM | POA: Diagnosis not present

## 2020-06-16 DIAGNOSIS — E876 Hypokalemia: Secondary | ICD-10-CM | POA: Diagnosis not present

## 2020-06-16 DIAGNOSIS — N186 End stage renal disease: Secondary | ICD-10-CM | POA: Diagnosis not present

## 2020-06-16 DIAGNOSIS — R197 Diarrhea, unspecified: Secondary | ICD-10-CM | POA: Diagnosis not present

## 2020-06-16 DIAGNOSIS — D631 Anemia in chronic kidney disease: Secondary | ICD-10-CM | POA: Diagnosis not present

## 2020-06-17 ENCOUNTER — Other Ambulatory Visit: Payer: Self-pay | Admitting: Gastroenterology

## 2020-06-18 ENCOUNTER — Ambulatory Visit: Payer: Medicare Other | Admitting: Cardiology

## 2020-06-18 ENCOUNTER — Other Ambulatory Visit: Payer: Self-pay | Admitting: Internal Medicine

## 2020-06-18 ENCOUNTER — Encounter: Payer: Self-pay | Admitting: Cardiology

## 2020-06-18 ENCOUNTER — Other Ambulatory Visit: Payer: Self-pay

## 2020-06-18 VITALS — BP 105/62 | HR 77 | Resp 16 | Ht 68.0 in | Wt 145.0 lb

## 2020-06-18 DIAGNOSIS — I5022 Chronic systolic (congestive) heart failure: Secondary | ICD-10-CM | POA: Diagnosis not present

## 2020-06-18 DIAGNOSIS — R197 Diarrhea, unspecified: Secondary | ICD-10-CM | POA: Diagnosis not present

## 2020-06-18 DIAGNOSIS — I480 Paroxysmal atrial fibrillation: Secondary | ICD-10-CM | POA: Diagnosis not present

## 2020-06-18 DIAGNOSIS — I10 Essential (primary) hypertension: Secondary | ICD-10-CM

## 2020-06-18 DIAGNOSIS — T8249XD Other complication of vascular dialysis catheter, subsequent encounter: Secondary | ICD-10-CM | POA: Diagnosis not present

## 2020-06-18 DIAGNOSIS — N186 End stage renal disease: Secondary | ICD-10-CM | POA: Diagnosis not present

## 2020-06-18 DIAGNOSIS — Z992 Dependence on renal dialysis: Secondary | ICD-10-CM | POA: Diagnosis not present

## 2020-06-18 DIAGNOSIS — D689 Coagulation defect, unspecified: Secondary | ICD-10-CM | POA: Diagnosis not present

## 2020-06-18 DIAGNOSIS — D509 Iron deficiency anemia, unspecified: Secondary | ICD-10-CM | POA: Diagnosis not present

## 2020-06-18 DIAGNOSIS — N2581 Secondary hyperparathyroidism of renal origin: Secondary | ICD-10-CM | POA: Diagnosis not present

## 2020-06-18 DIAGNOSIS — D631 Anemia in chronic kidney disease: Secondary | ICD-10-CM | POA: Diagnosis not present

## 2020-06-18 DIAGNOSIS — E876 Hypokalemia: Secondary | ICD-10-CM | POA: Diagnosis not present

## 2020-06-18 NOTE — Addendum Note (Signed)
Addended by: Manuela Schwartz T on: 06/18/2020 05:37 PM   Modules accepted: Orders

## 2020-06-18 NOTE — Progress Notes (Signed)
Follow up visit  Subjective:   Jason ORTEZ Sr., male    DOB: 12-24-1945, 74 y.o.   MRN: [id number]   HPI  Chief Complaint  Patient presents with  . HFrEF  . Follow-up    25 month    74 year old African-American male with end-stage renal disease, presumed nonischemic cardiomyopathy, paroxysmal atrial fibrillation, h/o stroke (06/2018), former smoker, h/o rectal cancer  Patient underwent right MCA stenting after his stroke in December 2019. He does not have any recent TIA/stroke symptoms. Blood pressure is well controlled. He has not had any leg edema, shortness of breath. Recent echocardiogram results with the patient, details below.  He denies chest pain, shortness of breath, palpitations, leg edema, orthopnea, PND, TIA/syncope.   Current Outpatient Medications on File Prior to Visit  Medication Sig Dispense Refill  . acetaminophen (TYLENOL) 325 MG tablet Take 650 mg by mouth every 4 (four) hours as needed for mild pain or moderate pain.     Marland Kitchen allopurinol (ZYLOPRIM) 100 MG tablet Take 1 tablet (100 mg total) by mouth daily. 90 tablet 1  . atorvastatin (LIPITOR) 20 MG tablet Take 1 tablet (20 mg total) by mouth every evening. 90 tablet 1  . BIDIL 20-37.5 MG tablet TAKE 1 TABLET BY MOUTH THREE TIMES DAILY 90 tablet 0  . cinacalcet (SENSIPAR) 60 MG tablet Take 60 mg by mouth daily.    . diphenoxylate-atropine (LOMOTIL) 2.5-0.025 MG tablet TAKE 1 TABLET BY MOUTH 4 TIMES DAILY AS NEEDED FOR  DIARRHEA  OR  LOOSE  STOOLS 45 tablet 0  . doxercalciferol (HECTOROL) 4 MCG/2ML injection Doxercalciferol (Hectorol)    . FOSRENOL 1000 MG PACK Take 1,000 mg by mouth 3 (three) times daily with meals.     . gabapentin (NEURONTIN) 100 MG capsule TAKE 1 CAPSULE BY MOUTH THREE TIMES DAILY 90 capsule 0  . heparin 1000 unit/mL SOLN injection Heparin Sodium (Porcine) 1,000 Units/mL Catheter Lock Arterial    . HYDROcodone-acetaminophen (NORCO/VICODIN) 5-325 MG tablet Take 1 tablet by mouth every 4 (four)  hours as needed for moderate pain. 20 tablet 0  . iron sucrose in sodium chloride 0.9 % 100 mL Iron Sucrose (Venofer)    . lipase/protease/amylase (CREON) 36000 UNITS CPEP capsule Take 2 capsules (72,000 Units total) by mouth 3 (three) times daily with meals. May also take 1 capsule (36,000 Units total) as needed (with snacks). 240 capsule 11  . loperamide (IMODIUM) 2 MG capsule Take 2 mg by mouth as needed for diarrhea or loose stools.    . Methoxy PEG-Epoetin Beta (MIRCERA IJ) Mircera    . metoprolol succinate (TOPROL-XL) 25 MG 24 hr tablet Take 1 tablet (25 mg total) by mouth daily. 90 tablet 1  . ondansetron (ZOFRAN) 4 MG tablet Take 1 tablet (4 mg total) by mouth every 8 (eight) hours as needed. 20 tablet 1  . polycarbophil (FIBERCON) 625 MG tablet Take 625 mg by mouth as needed for diarrhea or loose stools.     . warfarin (COUMADIN) 5 MG tablet TAKE 1 TABLET BY MOUTH DAILY, EXCEPT 1/2 TABLET ON MONDAY AND FRIDAYS OR TAKE AS DIRECTED BY ANTICOAGULATION CLINIC (Patient taking differently: Take 2.5-5 mg by mouth See admin instructions. Take 5 mg on Sun, Tue, Wed, Thurs, and Sat. Take 2.5 mg on Mon and Fri.) 90 tablet 1  . [DISCONTINUED] allopurinol (ZYLOPRIM) 100 MG tablet Take 1 tablet by mouth once daily (Patient taking differently: Take 100 mg by mouth daily. ) 90 tablet 0  . [DISCONTINUED]  allopurinol (ZYLOPRIM) 100 MG tablet Take 1 tablet by mouth once daily (Patient taking differently: Taking it in morning) 90 tablet 0  . [DISCONTINUED] atorvastatin (LIPITOR) 20 MG tablet TAKE 1 TABLET BY MOUTH  DAILY AT 6 PM. 90 tablet 1   No current facility-administered medications on file prior to visit.    Cardiovascular & other pertient studies:  EKG 06/18/2020: Sinus rhythm 77 bpm  Nonspecific T-abnormality  Echocardiogram 12/23/2019:  Normal LV systolic function with visual EF 50-55%. Left ventricle cavity  is normal in size. Mild concentric hypertrophy of the left ventricle.  Normal global  wall motion. Indeterminate diastolic filling pattern.  Left atrial cavity is severely dilated. The interatrial Septum is thin and  mobile but appears to be intact by 2D and CF Doppler interrogation.  Mild (Grade I) mitral regurgitation.  Mild tricuspid regurgitation.  Mild pulmonic regurgitation.  Compared to previous study 06/2018: LVEF improved from 20-25% to 50-55%.  LAE is now severe and mild MR, TR, and PR are new.   EKG 11/16/2019: Sinus rhythm 74 bpm.  Nonspecific ST-T abnormality  MRA head 09/2019: 1. Increased, mild right supraclinoid ICA stenosis. 2. Unchanged atherosclerotic narrowing elsewhere including moderate left supraclinoid ICA, mild basilar artery, and severe proximal right P2 stenoses. 3. Patent right MCA stent. 4. Unchanged 2 mm right cavernous ICA aneurysm.   Echocardiogram 06/2018: - Left ventricle: The cavity size was mildly dilated. Systolic  function was severely reduced. The estimated ejection fraction  was in the range of 20% to 25%. Diffuse hypokinesis.  - Left atrium: The atrium was mildly dilated.  - Right atrium: The atrium was mildly dilated.  - Atrial septum: No defect or patent foramen ovale was identified.   Impressions:   - Findings consistent with dilated cardiomyopathy. Consider TEE if  clinically indicated. No obvious source of cerebral embolism.   MRA Head 06/2018: 1. Patchy acute right MCA infarcts with greatest involvement of the basal ganglia. 2. Mild chronic small vessel ischemic disease. 3. Continued patency of the intracranial right ICA, right ACA, and proximal right MCA following revascularization. Artifact from right MCA stent with decreased number of distal MCA branch vessels. 4. Mild right and moderate to severe left supraclinoid ICA stenoses. 5. Moderate bilateral ACA origin stenoses. 6. Occluded distal right vertebral artery. 7. Severe proximal right P2 stenosis. 8. 2 mm right cavernous ICA aneurysm.   MRI  Brain 06/2018: 1. Patchy acute right MCA infarcts with greatest involvement of the basal ganglia. 2. Mild chronic small vessel ischemic disease. 3. Continued patency of the intracranial right ICA, right ACA, and proximal right MCA following revascularization. Artifact from right MCA stent with decreased number of distal MCA branch vessels. 4. Mild right and moderate to severe left supraclinoid ICA stenoses. 5. Moderate bilateral ACA origin stenoses. 6. Occluded distal right vertebral artery. 7. Severe proximal right P2 stenosis. 8. 2 mm right cavernous ICA aneurysm.  CT head 06/2018: 1. Hyperdense Right MCA M1 segment compatible with ELVO. Subtle cytotoxic edema suspected in the right insula, ASPECTS is 9. 2. Negative for bleed. 3. These results were communicated to Dr. Wilford Corner at 3:33 pmon 12/3/2019by text page via the [facility] messaging system.    Recent labs: 04/30/2020: Glucose 77, BUN/Cr 27/7.3.  H/H 11/34.   12/12/2019: Chol 113, TG 73, HDL 54, LDL 44 TSH 1.8 normal  11/22/2019: Glucose 78, BUN/Cr 27/6.5. EGFR 8. Na/K 129/3.3. Rest of the CMP normal H/H 13.6/40     Review of Systems  Cardiovascular: Negative for chest  pain, dyspnea on exertion, leg swelling, palpitations and syncope.         Vitals:   06/18/20 1311  BP: 105/62  Pulse: 77  Resp: 16  SpO2: 95%     Body mass index is 22.05 kg/m. Filed Weights   06/18/20 1311  Weight: 145 lb (65.8 kg)     Objective:   Physical Exam Vitals and nursing note reviewed.  Constitutional:      General: He is not in acute distress. Neck:     Vascular: No JVD.  Cardiovascular:     Rate and Rhythm: Normal rate and regular rhythm.     Pulses: Intact distal pulses.     Heart sounds: Murmur (Continuous murmur, likely due to hemodyalysis access) heard.    Pulmonary:     Effort: Pulmonary effort is normal.     Breath sounds: Normal breath sounds. No wheezing or rales.         Assessment &  Recommendations:   74 year old African-American male with end-stage renal disease, presumed nonischemic cardiomyopathy, paroxysmal atrial fibrillation, h/o stroke (06/2018), former smoker, h/o rectal cancer  HFrEF: EF recovered to 50-55% (12/2019) Clinically euvolumic. Conitnue Bidil and metoprolol succinate.   Paroxysmal Afib: CHA2DS2VAsc score 4, annual stoke risk 5% Currently on warfarin, managed by PCP  H/o stroke: Consider f/u w/neurology as well as IR given his ICA stenosis. He tells me he saw someone for follow up, but cannot recall name. Defer to PCP.  F/u in 1 year  Nigel Mormon, MD [facility] Pager: [phone] Office: [phone]

## 2020-06-18 NOTE — Telephone Encounter (Signed)
Ok to forward to pcp

## 2020-06-18 NOTE — Telephone Encounter (Signed)
Patient calling to follow up on Lomotil refill states [facility] does not have it yet

## 2020-06-19 DIAGNOSIS — T8249XD Other complication of vascular dialysis catheter, subsequent encounter: Secondary | ICD-10-CM | POA: Diagnosis not present

## 2020-06-19 DIAGNOSIS — Z992 Dependence on renal dialysis: Secondary | ICD-10-CM | POA: Diagnosis not present

## 2020-06-19 DIAGNOSIS — E876 Hypokalemia: Secondary | ICD-10-CM | POA: Diagnosis not present

## 2020-06-19 DIAGNOSIS — D631 Anemia in chronic kidney disease: Secondary | ICD-10-CM | POA: Diagnosis not present

## 2020-06-19 DIAGNOSIS — R197 Diarrhea, unspecified: Secondary | ICD-10-CM | POA: Diagnosis not present

## 2020-06-19 DIAGNOSIS — N2581 Secondary hyperparathyroidism of renal origin: Secondary | ICD-10-CM | POA: Diagnosis not present

## 2020-06-19 DIAGNOSIS — N186 End stage renal disease: Secondary | ICD-10-CM | POA: Diagnosis not present

## 2020-06-19 DIAGNOSIS — D689 Coagulation defect, unspecified: Secondary | ICD-10-CM | POA: Diagnosis not present

## 2020-06-19 DIAGNOSIS — D509 Iron deficiency anemia, unspecified: Secondary | ICD-10-CM | POA: Diagnosis not present

## 2020-06-19 NOTE — Telephone Encounter (Signed)
Rx was re-faxed to [facility] as required

## 2020-06-20 ENCOUNTER — Ambulatory Visit (INDEPENDENT_AMBULATORY_CARE_PROVIDER_SITE_OTHER): Payer: Medicare Other | Admitting: Podiatry

## 2020-06-20 ENCOUNTER — Other Ambulatory Visit: Payer: Self-pay

## 2020-06-20 ENCOUNTER — Encounter: Payer: Self-pay | Admitting: Podiatry

## 2020-06-20 DIAGNOSIS — G629 Polyneuropathy, unspecified: Secondary | ICD-10-CM

## 2020-06-20 DIAGNOSIS — M79674 Pain in right toe(s): Secondary | ICD-10-CM

## 2020-06-20 DIAGNOSIS — L84 Corns and callosities: Secondary | ICD-10-CM | POA: Diagnosis not present

## 2020-06-20 DIAGNOSIS — B351 Tinea unguium: Secondary | ICD-10-CM | POA: Diagnosis not present

## 2020-06-20 DIAGNOSIS — M2011 Hallux valgus (acquired), right foot: Secondary | ICD-10-CM

## 2020-06-20 DIAGNOSIS — N186 End stage renal disease: Secondary | ICD-10-CM | POA: Diagnosis not present

## 2020-06-20 DIAGNOSIS — M79675 Pain in left toe(s): Secondary | ICD-10-CM | POA: Diagnosis not present

## 2020-06-20 DIAGNOSIS — Z992 Dependence on renal dialysis: Secondary | ICD-10-CM | POA: Diagnosis not present

## 2020-06-20 DIAGNOSIS — M2012 Hallux valgus (acquired), left foot: Secondary | ICD-10-CM

## 2020-06-20 NOTE — Progress Notes (Signed)
Subjective:  Patient ID: Jason Cotta Sr., male    DOB: Jun 12, 1946,  MRN: [id number]  Jason Cotta Sr. presents to clinic today for at risk foot care. Patient has h/o ESRD on hemodialysis and painful callus(es) b/l heels and painful thick toenails that are difficult to trim. Pain interferes with ambulation. Aggravating factors include wearing enclosed shoe gear. Pain is relieved with periodic professional debridement.Marland Kitchen  He is on renal dialysis on TTS.  PCP is Dr. [name] and last visit was 02/13/2020. He voices no new pedal concerns on today's visit.  Review of Systems: Negative except as noted in the HPI. Past Medical History:  Diagnosis Date  . Anemia   . Arthritis   . Atrial fibrillation ([place])    on Coumadin   . Chronic renal insufficiency 07/31/2011  . COPD (chronic obstructive pulmonary disease) ([place])    pt denies  . CVA (cerebral vascular accident) ([place]) 06/2018  . Deficiency anemia 07/20/2012  . Diverticulosis   . Edema 09/21/2010   Qualifier: Diagnosis of  By: Jerold Coombe    . ERECTILE DYSFUNCTION, MILD 05/11/2007   Qualifier: Diagnosis of  By: Sherren Mocha MD, Jory Ee   . Exertional dyspnea 10/30/2015  . GOUT 05/11/2007   Qualifier: Diagnosis of  By: Sherren Mocha MD, Jory Ee   . Hereditary and idiopathic peripheral neuropathy 09/14/2014  . History of radiation therapy 02/21/13-03/31/13   rectum 50.4Gy total dose  . Hypersomnia 10/30/2015  . Hypertension   . Lung nodule 10/30/2015  . OAB (overactive bladder) 01/09/2016  . Obesity (BMI 30.0-34.9) 10/30/2015  . Obstructive sleep apnea 07/29/2010   HST 11/2015 AHI 54.  On autocpap  >> 10 cm   . Peripheral edema 09/19/2013  . rectal ca dx'd 02/01/13   rectal. Radiation and chemotherapy- remains on chemotherapy-next tx. 08-22-13.   . S/P partial lobectomy of lung 11/20/2015  . S/P thoracotomy   . Sleep apnea    wears cpap   . SUI (stress urinary incontinence), male 01/09/2016   Past Surgical History:  Procedure Laterality Date  . A/V  FISTULAGRAM Left 06/14/2018   Procedure: A/V FISTULAGRAM;  Surgeon: Waynetta Sandy, MD;  Location: [place] [facility];  Service: Cardiovascular;  Laterality: Left;  . AV FISTULA PLACEMENT Left 03/24/2016   Procedure: CREATION OF LEFT BRACIOCEPHALIC ARTERIOVENOUS (AV) FISTULA;  Surgeon: Elam Dutch, MD;  Location: [place];  Service: Vascular;  Laterality: Left;  . BIOPSY  09/17/2018   Procedure: BIOPSY;  Surgeon: Doran Stabler, MD;  Location: [facility];  Service: Gastroenterology;;  . BIOPSY  03/09/2019   Procedure: BIOPSY;  Surgeon: Doran Stabler, MD;  Location: [facility];  Service: Gastroenterology;;  . BIOPSY  11/22/2019   Procedure: BIOPSY;  Surgeon: Doran Stabler, MD;  Location: [facility];  Service: Gastroenterology;;  . COLON SURGERY  05/20/2013  . COLONOSCOPY  2010   [facility]  . COLONOSCOPY WITH PROPOFOL N/A 09/17/2018   Procedure: COLONOSCOPY WITH PROPOFOL;  Surgeon: Doran Stabler, MD;  Location: [facility];  Service: Gastroenterology;  Laterality: N/A;  . COLONOSCOPY WITH PROPOFOL N/A 11/22/2019   Procedure: COLONOSCOPY WITH PROPOFOL;  Surgeon: Doran Stabler, MD;  Location: [facility];  Service: Gastroenterology;  Laterality: N/A;  Hx of colon polyps tt   . ESOPHAGOGASTRODUODENOSCOPY (EGD) WITH PROPOFOL N/A 09/17/2018   Procedure: ESOPHAGOGASTRODUODENOSCOPY (EGD) WITH PROPOFOL;  Surgeon: Doran Stabler, MD;  [facility];  Service: Gastroenterology;  Laterality: N/A;  .  ESOPHAGOGASTRODUODENOSCOPY (EGD) WITH PROPOFOL N/A 03/09/2019   Procedure: ESOPHAGOGASTRODUODENOSCOPY (EGD) WITH PROPOFOL;  Surgeon: Doran Stabler, MD;  [facility]: [facility];  Service: Gastroenterology;  Laterality: N/A;  . EUS N/A 02/03/2013   Procedure: LOWER ENDOSCOPIC ULTRASOUND (EUS);  Surgeon: Milus Banister, MD;  Location: [facility];  Service: Endoscopy;  Laterality: N/A;  . HEMOSTASIS CLIP PLACEMENT  11/22/2019   Procedure: HEMOSTASIS CLIP  PLACEMENT;  Surgeon: Doran Stabler, MD;  [facility];  Service: Gastroenterology;;  . Lucianne Lei CLOSURE N/A 08/24/2013   Procedure: CLOSURE OF LOOP ILEOSTOMY ;  Surgeon: Leighton Ruff, MD;  Location: [facility];  Service: General;  Laterality: N/A;  . IR ANGIO VERTEBRAL SEL SUBCLAVIAN INNOMINATE BILAT MOD SED  06/08/2018  . IR CT HEAD LTD  06/08/2018  . IR INTRAVSC STENT CERV CAROTID W/O EMB-PROT MOD SED INC ANGIO  06/08/2018  . IR PERCUTANEOUS ART THROMBECTOMY/INFUSION INTRACRANIAL INC DIAG ANGIO  06/08/2018  . LAPAROSCOPIC LOW ANTERIOR RESECTION N/A 05/20/2013   Procedure: LAPAROSCOPIC LOW ANTERIOR RESECTION WITH SPLENIC FLEXURE MOBILIZATION;  Surgeon: Leighton Ruff, MD;  Location: [facility];  Service: General;  Laterality: N/A;  . OSTOMY N/A 05/20/2013   Procedure: diverting OSTOMY;  Surgeon: Leighton Ruff, MD;  Location: [facility];  Service: General;  Laterality: N/A;  . PERIPHERAL VASCULAR INTERVENTION Left 06/14/2018   Procedure: PERIPHERAL VASCULAR INTERVENTION;  Surgeon: Waynetta Sandy, MD;  Location: [place] [facility];  Service: Cardiovascular;  Laterality: Left;  AVF on the Left  . POLYPECTOMY    . POLYPECTOMY  11/22/2019   Procedure: POLYPECTOMY;  Surgeon: Doran Stabler, MD;  Location: [facility];  Service: Gastroenterology;;  . RADIOLOGY WITH ANESTHESIA N/A 06/08/2018   Procedure: IR WITH ANESTHESIA;  Surgeon: Radiologist, Medication, MD;  Location: [place];  Service: Radiology;  Laterality: N/A;  . REVISON OF ARTERIOVENOUS FISTULA Left 01/04/1600   Procedure: PLICATION/REVISON OF LEFT BRACHIOCEPHALIC ARTERIOVENOUS FISTULA;  Surgeon: Elam Dutch, MD;  Location: [place];  Service: Vascular;  Laterality: Left;  . UPPER GASTROINTESTINAL ENDOSCOPY    . VIDEO ASSISTED THORACOSCOPY (VATS)/WEDGE RESECTION Left 11/20/2015   Procedure: VIDEO ASSISTED THORACOSCOPY (VATS)/LUNG RESECTION;  Surgeon: [name], MD;  Location: [place];  Service: Thoracic;  Laterality: Left;   Marland Kitchen VIDEO BRONCHOSCOPY N/A 11/20/2015   Procedure: VIDEO BRONCHOSCOPY;  Surgeon: [name], MD;  Location: [facility];  Service: Thoracic;  Laterality: N/A;    Current Outpatient Medications:  .  acetaminophen (TYLENOL) 325 MG tablet, Take 650 mg by mouth every 4 (four) hours as needed for mild pain or moderate pain. , Disp: , Rfl:  .  allopurinol (ZYLOPRIM) 100 MG tablet, Take 1 tablet (100 mg total) by mouth daily., Disp: 90 tablet, Rfl: 1 .  atorvastatin (LIPITOR) 20 MG tablet, Take 1 tablet (20 mg total) by mouth every evening., Disp: 90 tablet, Rfl: 1 .  BIDIL 20-37.5 MG tablet, TAKE 1 TABLET BY MOUTH THREE TIMES DAILY, Disp: 90 tablet, Rfl: 0 .  cinacalcet (SENSIPAR) 60 MG tablet, Take 60 mg by mouth daily., Disp: , Rfl:  .  diphenoxylate-atropine (LOMOTIL) 2.5-0.025 MG tablet, TAKE 1 TABLET BY MOUTH 4 TIMES DAILY AS NEEDED FOR  DIARRHEA  OR  LOOSE  STOOLS, Disp: 45 tablet, Rfl: 0 .  FOSRENOL 1000 MG PACK, Take 1,000 mg by mouth 3 (three) times daily with meals. , Disp: , Rfl:  .  gabapentin (NEURONTIN) 100 MG capsule, TAKE 1 CAPSULE BY MOUTH THREE TIMES DAILY, Disp: 90  capsule, Rfl: 5 .  heparin 1000 unit/mL SOLN injection, Heparin Sodium (Porcine) 1,000 Units/mL Catheter Lock Arterial, Disp: , Rfl:  .  iron sucrose in sodium chloride 0.9 % 100 mL, Iron Sucrose (Venofer), Disp: , Rfl:  .  lipase/protease/amylase (CREON) 36000 UNITS CPEP capsule, Take 2 capsules (72,000 Units total) by mouth 3 (three) times daily with meals. May also take 1 capsule (36,000 Units total) as needed (with snacks)., Disp: 240 capsule, Rfl: 11 .  Methoxy PEG-Epoetin Beta (MIRCERA IJ), Mircera, Disp: , Rfl:  .  metoprolol succinate (TOPROL-XL) 25 MG 24 hr tablet, Take 1 tablet (25 mg total) by mouth daily., Disp: 90 tablet, Rfl: 1 .  ondansetron (ZOFRAN) 4 MG tablet, Take 1 tablet (4 mg total) by mouth every 8 (eight) hours as needed., Disp: 20 tablet, Rfl: 1 .  polycarbophil (FIBERCON) 625 MG tablet, Take 625 mg  by mouth as needed for diarrhea or loose stools. , Disp: , Rfl:  .  warfarin (COUMADIN) 5 MG tablet, TAKE 1 TABLET BY MOUTH DAILY, EXCEPT 1/2 TABLET ON MONDAY AND FRIDAYS OR TAKE AS DIRECTED BY ANTICOAGULATION CLINIC (Patient taking differently: Take 2.5-5 mg by mouth See admin instructions. Take 5 mg on Sun, Tue, Wed, Thurs, and Sat. Take 2.5 mg on Mon and Fri.), Disp: 90 tablet, Rfl: 1 Allergies  Allergen Reactions  . Nsaids Other (See Comments)    Asked by surgeon to add this medication class as intolerance due to patients renal insufficiency.  . Lactase Diarrhea  . Lactose Intolerance (Gi) Diarrhea  . Phentermine Other (See Comments)    Unknown reaction  . Amlodipine Other (See Comments)    Edema    Social History   Occupational History    Comment: retired Insurance account manager  Tobacco Use  . Smoking status: Former Smoker    Packs/day: 0.50    Years: 10.00    Pack years: 5.00    Types: Cigarettes    Quit date: 07/07/1977    Years since quitting: 42.9  . Smokeless tobacco: Never Used  Vaping Use  . Vaping Use: Never used  Substance and Sexual Activity  . Alcohol use: Not Currently    Alcohol/week: 0.0 standard drinks  . Drug use: No  . Sexual activity: Not Currently    Objective:   Constitutional Jason PRUDE Sr. is a pleasant 74 y.o. African American male, WD, WN in NAD. AAO x 3.   Vascular Dorsalis pedis pulses faintly palpable bilaterally.Posterior tibial pulses palpable bilaterally. Capillary refill time <3 seconds b/l. No cyanosis or clubbing noted. Pedal hair growth absent b/l. No ischemia or gangrene noted b/l LE.  Neurologic Normal speech. Oriented to person, place, and time. Protective sensation diminished b/l LE.  Dermatologic Pedal skin with normal turgor, texture and tone b/l.  Nails thick, elongated, dystrophic with pain on palpation x 10. Hyperkeratotic lesions medial aspect of heelpads b/l with tenderness to palpation. No edema, no erythema, no drainage, no  flocculence. No open wounds. No skin lesions.  Orthopedic: Normal joint ROM without pain or crepitus bilaterally. HAV with bunion deformity right >left foot. No bony tenderness.Using AFO RLE.   Radiographs: None Assessment:   1. Pain due to onychomycosis of toenails of both feet   2. Callus   3. Neuropathy   4. Hallux valgus, acquired, bilateral   5. ESRD on dialysis [facility])    Plan:  Patient was evaluated and treated and all questions answered.  Onychomycosis with pain -Nails palliatively debridement as below -Educated on self-care  Procedure: Nail Debridement Rationale: Pain Type of Debridement: manual, sharp debridement. Instrumentation: Nail nipper, rotary burr. Number of Nails: 10  -Examined patient. -No new findings. No new orders. -Discussed plantar fascial brace. Advised him insurance will only pay for one device per foot. He would be responsible for cost of brace today. He declines if he is responsible for cost on today's visit. -Toenails 1-5 b/l were debrided in length and girth with sterile nail nippers and dremel without iatrogenic bleeding.  -Callus(es) b/l heels pared utilizing sterile scalpel blade without complication or incident. Total number debrided =2. Continue to use pillow between feet when in bed. -Patient to report any pedal injuries to medical professional immediately. -Patient to continue soft, supportive shoe gear daily. -Patient/POA to call should there be question/concern in the interim.  Return in about 3 months (around 09/18/2020) for routine foot care; dialysis patient.  Marzetta Board, DPM

## 2020-06-21 DIAGNOSIS — N186 End stage renal disease: Secondary | ICD-10-CM | POA: Diagnosis not present

## 2020-06-21 DIAGNOSIS — D631 Anemia in chronic kidney disease: Secondary | ICD-10-CM | POA: Diagnosis not present

## 2020-06-21 DIAGNOSIS — N2581 Secondary hyperparathyroidism of renal origin: Secondary | ICD-10-CM | POA: Diagnosis not present

## 2020-06-21 DIAGNOSIS — R197 Diarrhea, unspecified: Secondary | ICD-10-CM | POA: Diagnosis not present

## 2020-06-21 DIAGNOSIS — T8249XD Other complication of vascular dialysis catheter, subsequent encounter: Secondary | ICD-10-CM | POA: Diagnosis not present

## 2020-06-21 DIAGNOSIS — D509 Iron deficiency anemia, unspecified: Secondary | ICD-10-CM | POA: Diagnosis not present

## 2020-06-21 DIAGNOSIS — D689 Coagulation defect, unspecified: Secondary | ICD-10-CM | POA: Diagnosis not present

## 2020-06-21 DIAGNOSIS — E876 Hypokalemia: Secondary | ICD-10-CM | POA: Diagnosis not present

## 2020-06-21 DIAGNOSIS — Z992 Dependence on renal dialysis: Secondary | ICD-10-CM | POA: Diagnosis not present

## 2020-06-23 DIAGNOSIS — E876 Hypokalemia: Secondary | ICD-10-CM | POA: Diagnosis not present

## 2020-06-23 DIAGNOSIS — D631 Anemia in chronic kidney disease: Secondary | ICD-10-CM | POA: Diagnosis not present

## 2020-06-23 DIAGNOSIS — D689 Coagulation defect, unspecified: Secondary | ICD-10-CM | POA: Diagnosis not present

## 2020-06-23 DIAGNOSIS — R197 Diarrhea, unspecified: Secondary | ICD-10-CM | POA: Diagnosis not present

## 2020-06-23 DIAGNOSIS — N186 End stage renal disease: Secondary | ICD-10-CM | POA: Diagnosis not present

## 2020-06-23 DIAGNOSIS — D509 Iron deficiency anemia, unspecified: Secondary | ICD-10-CM | POA: Diagnosis not present

## 2020-06-23 DIAGNOSIS — N2581 Secondary hyperparathyroidism of renal origin: Secondary | ICD-10-CM | POA: Diagnosis not present

## 2020-06-23 DIAGNOSIS — T8249XD Other complication of vascular dialysis catheter, subsequent encounter: Secondary | ICD-10-CM | POA: Diagnosis not present

## 2020-06-23 DIAGNOSIS — Z992 Dependence on renal dialysis: Secondary | ICD-10-CM | POA: Diagnosis not present

## 2020-06-24 ENCOUNTER — Other Ambulatory Visit: Payer: Self-pay | Admitting: Gastroenterology

## 2020-06-25 ENCOUNTER — Other Ambulatory Visit: Payer: Self-pay | Admitting: Gastroenterology

## 2020-06-25 ENCOUNTER — Telehealth: Payer: Self-pay | Admitting: Pharmacist

## 2020-06-25 NOTE — Progress Notes (Signed)
Chronic Care Management Pharmacy Assistant   Name: GORMAN SAFI Sr.  MRN: [id number] DOB: 10/01/1945  Reason for Encounter: Medication Review  Patient Questions:  1.  Have you seen any other providers since your last visit? No  2.  Any changes in your medicines or health? No   PCP : [name], MD  Allergies:   Allergies  Allergen Reactions  . Nsaids Other (See Comments)    Asked by surgeon to add this medication class as intolerance due to patients renal insufficiency.  . Lactase Diarrhea  . Lactose Intolerance (Gi) Diarrhea  . Phentermine Other (See Comments)    Unknown reaction  . Amlodipine Other (See Comments)    Edema     Medications: Outpatient Encounter Medications as of 06/25/2020  Medication Sig Note  . acetaminophen (TYLENOL) 325 MG tablet Take 650 mg by mouth every 4 (four) hours as needed for mild pain or moderate pain.    Marland Kitchen allopurinol (ZYLOPRIM) 100 MG tablet Take 1 tablet (100 mg total) by mouth daily.   Marland Kitchen atorvastatin (LIPITOR) 20 MG tablet Take 1 tablet (20 mg total) by mouth every evening.   Marland Kitchen BIDIL 20-37.5 MG tablet TAKE 1 TABLET BY MOUTH THREE TIMES DAILY   . cinacalcet (SENSIPAR) 60 MG tablet Take 60 mg by mouth daily.   . diphenoxylate-atropine (LOMOTIL) 2.5-0.025 MG tablet TAKE 1 TABLET BY MOUTH 4 TIMES DAILY AS NEEDED FOR  DIARRHEA  OR  LOOSE  STOOLS   . FOSRENOL 1000 MG PACK Take 1,000 mg by mouth 3 (three) times daily with meals.    . gabapentin (NEURONTIN) 100 MG capsule TAKE 1 CAPSULE BY MOUTH THREE TIMES DAILY   . heparin 1000 unit/mL SOLN injection Heparin Sodium (Porcine) 1,000 Units/mL Catheter Lock Arterial   . iron sucrose in sodium chloride 0.9 % 100 mL Iron Sucrose (Venofer)   . lipase/protease/amylase (CREON) 36000 UNITS CPEP capsule Take 2 capsules (72,000 Units total) by mouth 3 (three) times daily with meals. May also take 1 capsule (36,000 Units total) as needed (with snacks). 04/03/2020: Via Durenda Hurt Assist PAP  . Methoxy  PEG-Epoetin Beta (MIRCERA IJ) Mircera   . metoprolol succinate (TOPROL-XL) 25 MG 24 hr tablet Take 1 tablet (25 mg total) by mouth daily.   . ondansetron (ZOFRAN) 4 MG tablet Take 1 tablet (4 mg total) by mouth every 8 (eight) hours as needed.   . polycarbophil (FIBERCON) 625 MG tablet Take 625 mg by mouth as needed for diarrhea or loose stools.    . warfarin (COUMADIN) 5 MG tablet TAKE 1 TABLET BY MOUTH DAILY, EXCEPT 1/2 TABLET ON MONDAY AND FRIDAYS OR TAKE AS DIRECTED BY ANTICOAGULATION CLINIC (Patient taking differently: Take 2.5-5 mg by mouth See admin instructions. Take 5 mg on Sun, Tue, Wed, Thurs, and Sat. Take 2.5 mg on Mon and Fri.)   . [DISCONTINUED] allopurinol (ZYLOPRIM) 100 MG tablet Take 1 tablet by mouth once daily (Patient taking differently: Take 100 mg by mouth daily. )   . [DISCONTINUED] allopurinol (ZYLOPRIM) 100 MG tablet Take 1 tablet by mouth once daily (Patient taking differently: Taking it in morning)   . [DISCONTINUED] atorvastatin (LIPITOR) 20 MG tablet TAKE 1 TABLET BY MOUTH  DAILY AT 6 PM.    No facility-administered encounter medications on file as of 06/25/2020.    Current Diagnosis: Patient Active Problem List   Diagnosis Date Noted  . Complication of vascular dialysis catheter 04/18/2020  . Other disorders of phosphorus metabolism 04/04/2020  .  Allergy, unspecified, initial encounter 03/01/2020  . Anaphylactic shock, unspecified, initial encounter 03/01/2020  . Viral upper respiratory tract infection 02/13/2020  . Fever 01/16/2020  . Acute prostatitis with hematuria 01/16/2020  . Benign prostatic hyperplasia without lower urinary tract symptoms 12/12/2019  . Meralgia paraesthetica, right 08/12/2019  . 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 Gait disturbance, post-stroke 04/17/2019  . Iron deficiency anemia, unspecified 03/14/2019  . Chronic obstructive pulmonary disease, unspecified ([place]) 01/25/2019  . Coagulation defect, unspecified  ([place]) 01/25/2019  . Diarrhea, unspecified 01/25/2019  . Hypertensive chronic kidney disease with stage 5 chronic kidney disease or end stage renal disease ([place]) 01/25/2019  . Diverticulosis of intestine, part unspecified, without perforation or abscess without bleeding 01/25/2019  . Other idiopathic peripheral autonomic neuropathy 01/25/2019  . Hypokalemia 01/25/2019  . Male erectile dysfunction, unspecified 01/25/2019  . Pain, unspecified 01/25/2019  . Primary generalized (osteo)arthritis 01/25/2019  . Pruritus, unspecified 01/25/2019  . Secondary hyperparathyroidism of renal origin ([place]) 01/25/2019  . Long term (current) use of anticoagulants 12/31/2018  . Paroxysmal atrial fibrillation ([place]) 12/22/2018  . Longstanding persistent atrial fibrillation ([place]) 09/20/2018  . Functional diarrhea 07/14/2018  . Benign essential HTN   . ESRD on dialysis ([place]) 06/16/2018  . Mild malnutrition ([place]) 06/16/2018  . Stroke due to embolism ([place]) 06/16/2018  . Chronic HFrEF (heart failure with reduced ejection fraction) ([place])   . CVA (cerebral vascular accident) ([place]) 06/08/2018  . CHF (congestive heart failure) ([place]) 05/08/2018  . Hyperlipidemia LDL goal <70 09/23/2017  . OAB (overactive bladder) 01/09/2016  . SUI (stress urinary incontinence), male 01/09/2016  . Hypersomnia 10/30/2015  . Obesity (BMI 30.0-34.9) 10/30/2015  . Routine general medical examination at a health care facility 06/04/2015  . Medicare annual wellness visit, subsequent 06/04/2015  . Hereditary and idiopathic peripheral neuropathy 09/14/2014  . Rectal cancer ([place]) 02/01/2013  . Obstructive sleep apnea 07/29/2010  . GOUT 05/11/2007  . ERECTILE DYSFUNCTION, MILD 05/11/2007  . Essential hypertension 05/11/2007    Goals Addressed   None     Follow-Up:  Coordination of Enhanced Pharmacy Services    Reviewed chart for medication changes ahead of medication coordination call.  No OVs, Consults, or hospital visits since  last care coordination call/Pharmacist visit. No medication changes indicated.  BP Readings from Last 3 Encounters:  06/18/20 105/62  06/13/20 (!) 108/56  05/29/20 114/61    Lab Results  Component Value Date   HGBA1C 5.5 06/09/2018     Patient obtains medications through Vials  30 Days   Last adherence delivery included:   Allopurinol 100mg  daily  Atorvastatin 20mg  daily  Metoprolol Tartrate 25mg    Patient declined Warfarin  5mg   last month due to additional supply on hand. The patient received a 90-day supply on 04/09/2020 supply ends on 07/09/2020  Patient is due for next adherence delivery on: 07/09/2020 Called patient and reviewed medications and coordinated delivery.  This delivery to include:  Allopurinol 100mg  daily  Atorvastatin 20mg  daily  Metoprolol Tartrate 25mg   Warfarin 5mg  daily   Coordinated acute fill for Ondansetron Hcl 4mg   to be delivered 06/26/2020  Confirmed delivery date of 07/10/2019, advised patient that pharmacy will contact them the morning of delivery.  Rosendo Gros, [facility]  Practice Team Manager/ CPA (Clinical Pharmacist Assistant) [phone]

## 2020-06-25 NOTE — Telephone Encounter (Signed)
I contacted pharmacy and spoke to pharmacist, Leticia Clas. I gave a verbal authorization for lomotil #45 tablets since our faxes are apparently not getting through to them.

## 2020-06-25 NOTE — Telephone Encounter (Signed)
Inbound call from patient stating he called [facility] on [address] and as of today they don not have Lomotil script there.  Please advise.

## 2020-06-26 ENCOUNTER — Other Ambulatory Visit: Payer: Self-pay | Admitting: Gastroenterology

## 2020-06-26 DIAGNOSIS — D689 Coagulation defect, unspecified: Secondary | ICD-10-CM | POA: Diagnosis not present

## 2020-06-26 DIAGNOSIS — D631 Anemia in chronic kidney disease: Secondary | ICD-10-CM | POA: Diagnosis not present

## 2020-06-26 DIAGNOSIS — N186 End stage renal disease: Secondary | ICD-10-CM | POA: Diagnosis not present

## 2020-06-26 DIAGNOSIS — Z992 Dependence on renal dialysis: Secondary | ICD-10-CM | POA: Diagnosis not present

## 2020-06-26 DIAGNOSIS — D509 Iron deficiency anemia, unspecified: Secondary | ICD-10-CM | POA: Diagnosis not present

## 2020-06-26 DIAGNOSIS — N2581 Secondary hyperparathyroidism of renal origin: Secondary | ICD-10-CM | POA: Diagnosis not present

## 2020-06-26 DIAGNOSIS — T8249XD Other complication of vascular dialysis catheter, subsequent encounter: Secondary | ICD-10-CM | POA: Diagnosis not present

## 2020-06-26 DIAGNOSIS — R197 Diarrhea, unspecified: Secondary | ICD-10-CM | POA: Diagnosis not present

## 2020-06-26 DIAGNOSIS — E876 Hypokalemia: Secondary | ICD-10-CM | POA: Diagnosis not present

## 2020-06-28 ENCOUNTER — Telehealth: Payer: Medicare Other

## 2020-06-28 DIAGNOSIS — N2581 Secondary hyperparathyroidism of renal origin: Secondary | ICD-10-CM | POA: Diagnosis not present

## 2020-06-28 DIAGNOSIS — E876 Hypokalemia: Secondary | ICD-10-CM | POA: Diagnosis not present

## 2020-06-28 DIAGNOSIS — D631 Anemia in chronic kidney disease: Secondary | ICD-10-CM | POA: Diagnosis not present

## 2020-06-28 DIAGNOSIS — D689 Coagulation defect, unspecified: Secondary | ICD-10-CM | POA: Diagnosis not present

## 2020-06-28 DIAGNOSIS — Z992 Dependence on renal dialysis: Secondary | ICD-10-CM | POA: Diagnosis not present

## 2020-06-28 DIAGNOSIS — R197 Diarrhea, unspecified: Secondary | ICD-10-CM | POA: Diagnosis not present

## 2020-06-28 DIAGNOSIS — D509 Iron deficiency anemia, unspecified: Secondary | ICD-10-CM | POA: Diagnosis not present

## 2020-06-28 DIAGNOSIS — N186 End stage renal disease: Secondary | ICD-10-CM | POA: Diagnosis not present

## 2020-06-28 DIAGNOSIS — T8249XD Other complication of vascular dialysis catheter, subsequent encounter: Secondary | ICD-10-CM | POA: Diagnosis not present

## 2020-07-01 ENCOUNTER — Other Ambulatory Visit: Payer: Self-pay | Admitting: Gastroenterology

## 2020-07-01 DIAGNOSIS — D689 Coagulation defect, unspecified: Secondary | ICD-10-CM | POA: Diagnosis not present

## 2020-07-01 DIAGNOSIS — N186 End stage renal disease: Secondary | ICD-10-CM | POA: Diagnosis not present

## 2020-07-01 DIAGNOSIS — Z992 Dependence on renal dialysis: Secondary | ICD-10-CM | POA: Diagnosis not present

## 2020-07-01 DIAGNOSIS — D509 Iron deficiency anemia, unspecified: Secondary | ICD-10-CM | POA: Diagnosis not present

## 2020-07-01 DIAGNOSIS — N2581 Secondary hyperparathyroidism of renal origin: Secondary | ICD-10-CM | POA: Diagnosis not present

## 2020-07-01 DIAGNOSIS — D631 Anemia in chronic kidney disease: Secondary | ICD-10-CM | POA: Diagnosis not present

## 2020-07-01 DIAGNOSIS — R197 Diarrhea, unspecified: Secondary | ICD-10-CM | POA: Diagnosis not present

## 2020-07-01 DIAGNOSIS — E876 Hypokalemia: Secondary | ICD-10-CM | POA: Diagnosis not present

## 2020-07-01 DIAGNOSIS — T8249XD Other complication of vascular dialysis catheter, subsequent encounter: Secondary | ICD-10-CM | POA: Diagnosis not present

## 2020-07-02 ENCOUNTER — Telehealth: Payer: Self-pay | Admitting: Pharmacist

## 2020-07-02 NOTE — Progress Notes (Signed)
Missed a call form the patient. Returned his call the patient was calling to verify his delivery today for his Zofran. The patient was informed that he will receive his Zofran today. On 07/09/2020 he will receive Allopurinol, Atorvastatin, Metoprolol, Warfarin.   Rosendo Gros, [facility]  Practice Team Manager/ CPA (Clinical Pharmacist Assistant) [phone]

## 2020-07-03 ENCOUNTER — Telehealth: Payer: Self-pay | Admitting: Pharmacist

## 2020-07-03 DIAGNOSIS — E876 Hypokalemia: Secondary | ICD-10-CM | POA: Diagnosis not present

## 2020-07-03 DIAGNOSIS — Z992 Dependence on renal dialysis: Secondary | ICD-10-CM | POA: Diagnosis not present

## 2020-07-03 DIAGNOSIS — N2581 Secondary hyperparathyroidism of renal origin: Secondary | ICD-10-CM | POA: Diagnosis not present

## 2020-07-03 DIAGNOSIS — T8249XD Other complication of vascular dialysis catheter, subsequent encounter: Secondary | ICD-10-CM | POA: Diagnosis not present

## 2020-07-03 DIAGNOSIS — N186 End stage renal disease: Secondary | ICD-10-CM | POA: Diagnosis not present

## 2020-07-03 DIAGNOSIS — D631 Anemia in chronic kidney disease: Secondary | ICD-10-CM | POA: Diagnosis not present

## 2020-07-03 DIAGNOSIS — R197 Diarrhea, unspecified: Secondary | ICD-10-CM | POA: Diagnosis not present

## 2020-07-03 DIAGNOSIS — D509 Iron deficiency anemia, unspecified: Secondary | ICD-10-CM | POA: Diagnosis not present

## 2020-07-03 DIAGNOSIS — D689 Coagulation defect, unspecified: Secondary | ICD-10-CM | POA: Diagnosis not present

## 2020-07-03 NOTE — Progress Notes (Signed)
Spoke with the patient today regarding his Zofran he has been waiting for delivery to come. The patient was given the delivery date for 07/03/2020 and he was informed of his co-pay as well   Jason [name], [place] Team Manager/ CPA (Clinical Pharmacist Assistant) [phone]

## 2020-07-04 DIAGNOSIS — T82858A Stenosis of vascular prosthetic devices, implants and grafts, initial encounter: Secondary | ICD-10-CM | POA: Diagnosis not present

## 2020-07-04 DIAGNOSIS — Z992 Dependence on renal dialysis: Secondary | ICD-10-CM | POA: Diagnosis not present

## 2020-07-04 DIAGNOSIS — N186 End stage renal disease: Secondary | ICD-10-CM | POA: Diagnosis not present

## 2020-07-04 DIAGNOSIS — I871 Compression of vein: Secondary | ICD-10-CM | POA: Diagnosis not present

## 2020-07-05 DIAGNOSIS — N2581 Secondary hyperparathyroidism of renal origin: Secondary | ICD-10-CM | POA: Diagnosis not present

## 2020-07-05 DIAGNOSIS — R197 Diarrhea, unspecified: Secondary | ICD-10-CM | POA: Diagnosis not present

## 2020-07-05 DIAGNOSIS — D509 Iron deficiency anemia, unspecified: Secondary | ICD-10-CM | POA: Diagnosis not present

## 2020-07-05 DIAGNOSIS — Z992 Dependence on renal dialysis: Secondary | ICD-10-CM | POA: Diagnosis not present

## 2020-07-05 DIAGNOSIS — E876 Hypokalemia: Secondary | ICD-10-CM | POA: Diagnosis not present

## 2020-07-05 DIAGNOSIS — D631 Anemia in chronic kidney disease: Secondary | ICD-10-CM | POA: Diagnosis not present

## 2020-07-05 DIAGNOSIS — T8249XD Other complication of vascular dialysis catheter, subsequent encounter: Secondary | ICD-10-CM | POA: Diagnosis not present

## 2020-07-05 DIAGNOSIS — N186 End stage renal disease: Secondary | ICD-10-CM | POA: Diagnosis not present

## 2020-07-05 DIAGNOSIS — D689 Coagulation defect, unspecified: Secondary | ICD-10-CM | POA: Diagnosis not present

## 2020-07-06 ENCOUNTER — Other Ambulatory Visit: Payer: Self-pay | Admitting: Gastroenterology

## 2020-07-06 DIAGNOSIS — Z992 Dependence on renal dialysis: Secondary | ICD-10-CM | POA: Diagnosis not present

## 2020-07-06 DIAGNOSIS — I10 Essential (primary) hypertension: Secondary | ICD-10-CM | POA: Diagnosis not present

## 2020-07-06 DIAGNOSIS — N186 End stage renal disease: Secondary | ICD-10-CM | POA: Diagnosis not present

## 2020-07-06 DIAGNOSIS — I129 Hypertensive chronic kidney disease with stage 1 through stage 4 chronic kidney disease, or unspecified chronic kidney disease: Secondary | ICD-10-CM | POA: Diagnosis not present

## 2020-07-08 DIAGNOSIS — D509 Iron deficiency anemia, unspecified: Secondary | ICD-10-CM | POA: Diagnosis not present

## 2020-07-08 DIAGNOSIS — T8249XD Other complication of vascular dialysis catheter, subsequent encounter: Secondary | ICD-10-CM | POA: Diagnosis not present

## 2020-07-08 DIAGNOSIS — D689 Coagulation defect, unspecified: Secondary | ICD-10-CM | POA: Diagnosis not present

## 2020-07-08 DIAGNOSIS — D631 Anemia in chronic kidney disease: Secondary | ICD-10-CM | POA: Diagnosis not present

## 2020-07-08 DIAGNOSIS — N2581 Secondary hyperparathyroidism of renal origin: Secondary | ICD-10-CM | POA: Diagnosis not present

## 2020-07-08 DIAGNOSIS — Z992 Dependence on renal dialysis: Secondary | ICD-10-CM | POA: Diagnosis not present

## 2020-07-08 DIAGNOSIS — N186 End stage renal disease: Secondary | ICD-10-CM | POA: Diagnosis not present

## 2020-07-08 DIAGNOSIS — R197 Diarrhea, unspecified: Secondary | ICD-10-CM | POA: Diagnosis not present

## 2020-07-08 NOTE — Telephone Encounter (Signed)
There seem to be multiple recent refill requests for this medicine.  Please try to clarify, with this patient or his wife, what they have for most recent refill bottle. That is, when filled and for how many tablets.  - HD

## 2020-07-09 ENCOUNTER — Telehealth: Payer: Self-pay | Admitting: Gastroenterology

## 2020-07-09 MED ORDER — DIPHENOXYLATE-ATROPINE 2.5-0.025 MG PO TABS: 1.0000 | ORAL_TABLET | Freq: Four times a day (QID) | ORAL | 4 refills | Status: DC

## 2020-07-09 NOTE — Telephone Encounter (Signed)
Then the pharmacy has sent the refill request too soon.  Please asked the patient or his wife to contact us when they need this medicine refilled.  - HD

## 2020-07-09 NOTE — Telephone Encounter (Signed)
Pt called requesting rf for meds for diarrhea. Pt did not remember the name of meds just stated that he was prescribed two medications before. [facility] [address].

## 2020-07-09 NOTE — Telephone Encounter (Signed)
I will send a new prescription for 120 capsules so he can take it 4 times a day if that is what he requires.  - HD

## 2020-07-09 NOTE — Telephone Encounter (Signed)
Pt has been made aware of the new Rx

## 2020-07-09 NOTE — Telephone Encounter (Signed)
I spoke to Jason [name]. He states he has been taking the Lomotil 4 times a day as indicated on the bottle, with not realizing it was prescribed as needed. Taking it 4 x a day has stopped his diarrhea, and is requesting more. Please advise on refills and directions for use.

## 2020-07-09 NOTE — Telephone Encounter (Signed)
Per [place]. His last refill for the Lomotil was 07-01-2020 number 45 tablets with no additional refills given at that time.

## 2020-07-09 NOTE — Addendum Note (Signed)
Addended by: Nelida Meuse on: 07/09/2020 04:09 PM   Modules accepted: Orders

## 2020-07-10 DIAGNOSIS — R197 Diarrhea, unspecified: Secondary | ICD-10-CM | POA: Diagnosis not present

## 2020-07-10 DIAGNOSIS — D689 Coagulation defect, unspecified: Secondary | ICD-10-CM | POA: Diagnosis not present

## 2020-07-10 DIAGNOSIS — N2581 Secondary hyperparathyroidism of renal origin: Secondary | ICD-10-CM | POA: Diagnosis not present

## 2020-07-10 DIAGNOSIS — D509 Iron deficiency anemia, unspecified: Secondary | ICD-10-CM | POA: Diagnosis not present

## 2020-07-10 DIAGNOSIS — D631 Anemia in chronic kidney disease: Secondary | ICD-10-CM | POA: Diagnosis not present

## 2020-07-10 DIAGNOSIS — N186 End stage renal disease: Secondary | ICD-10-CM | POA: Diagnosis not present

## 2020-07-10 DIAGNOSIS — Z992 Dependence on renal dialysis: Secondary | ICD-10-CM | POA: Diagnosis not present

## 2020-07-10 DIAGNOSIS — T8249XD Other complication of vascular dialysis catheter, subsequent encounter: Secondary | ICD-10-CM | POA: Diagnosis not present

## 2020-07-12 ENCOUNTER — Other Ambulatory Visit: Payer: Self-pay | Admitting: Gastroenterology

## 2020-07-12 DIAGNOSIS — R197 Diarrhea, unspecified: Secondary | ICD-10-CM | POA: Diagnosis not present

## 2020-07-12 DIAGNOSIS — D509 Iron deficiency anemia, unspecified: Secondary | ICD-10-CM | POA: Diagnosis not present

## 2020-07-12 DIAGNOSIS — D689 Coagulation defect, unspecified: Secondary | ICD-10-CM | POA: Diagnosis not present

## 2020-07-12 DIAGNOSIS — N186 End stage renal disease: Secondary | ICD-10-CM | POA: Diagnosis not present

## 2020-07-12 DIAGNOSIS — T8249XD Other complication of vascular dialysis catheter, subsequent encounter: Secondary | ICD-10-CM | POA: Diagnosis not present

## 2020-07-12 DIAGNOSIS — D631 Anemia in chronic kidney disease: Secondary | ICD-10-CM | POA: Diagnosis not present

## 2020-07-12 DIAGNOSIS — Z992 Dependence on renal dialysis: Secondary | ICD-10-CM | POA: Diagnosis not present

## 2020-07-12 DIAGNOSIS — N2581 Secondary hyperparathyroidism of renal origin: Secondary | ICD-10-CM | POA: Diagnosis not present

## 2020-07-13 ENCOUNTER — Telehealth: Payer: Self-pay | Admitting: Pharmacist

## 2020-07-13 ENCOUNTER — Other Ambulatory Visit: Payer: Self-pay | Admitting: Gastroenterology

## 2020-07-13 NOTE — Progress Notes (Signed)
Chronic Care Management Pharmacy Assistant   Name: Jason GOZA Sr.  MRN: [id number] DOB: Oct 15, 1945  Reason for Encounter: Medication Review  PCP : [name], MD  Allergies:   Allergies  Allergen Reactions   Nsaids Other (See Comments)    Asked by surgeon to add this medication class as intolerance due to patients renal insufficiency.   Lactase Diarrhea   Lactose Intolerance (Gi) Diarrhea   Phentermine Other (See Comments)    Unknown reaction   Amlodipine Other (See Comments)    Edema     Medications: Outpatient Encounter Medications as of 07/13/2020  Medication Sig Note   acetaminophen (TYLENOL) 325 MG tablet Take 650 mg by mouth every 4 (four) hours as needed for mild pain or moderate pain.     allopurinol (ZYLOPRIM) 100 MG tablet Take 1 tablet (100 mg total) by mouth daily.    atorvastatin (LIPITOR) 20 MG tablet Take 1 tablet (20 mg total) by mouth every evening.    BIDIL 20-37.5 MG tablet TAKE 1 TABLET BY MOUTH THREE TIMES DAILY    cinacalcet (SENSIPAR) 60 MG tablet Take 60 mg by mouth daily.    diphenoxylate-atropine (LOMOTIL) 2.5-0.025 MG tablet Take 1 tablet by mouth 4 (four) times daily.    FOSRENOL 1000 MG PACK Take 1,000 mg by mouth 3 (three) times daily with meals.     gabapentin (NEURONTIN) 100 MG capsule TAKE 1 CAPSULE BY MOUTH THREE TIMES DAILY    heparin 1000 unit/mL SOLN injection Heparin Sodium (Porcine) 1,000 Units/mL Catheter Lock Arterial    iron sucrose in sodium chloride 0.9 % 100 mL Iron Sucrose (Venofer)    lipase/protease/amylase (CREON) 36000 UNITS CPEP capsule Take 2 capsules (72,000 Units total) by mouth 3 (three) times daily with meals. May also take 1 capsule (36,000 Units total) as needed (with snacks). 04/03/2020: Via Durenda Hurt Assist PAP   Methoxy PEG-Epoetin Beta (MIRCERA IJ) Mircera    metoprolol succinate (TOPROL-XL) 25 MG 24 hr tablet Take 1 tablet (25 mg total) by mouth daily.    ondansetron (ZOFRAN) 4 MG tablet  Take 1 tablet (4 mg total) by mouth every 8 (eight) hours as needed.    polycarbophil (FIBERCON) 625 MG tablet Take 625 mg by mouth as needed for diarrhea or loose stools.     warfarin (COUMADIN) 5 MG tablet TAKE 1 TABLET BY MOUTH DAILY, EXCEPT 1/2 TABLET ON MONDAY AND FRIDAYS OR TAKE AS DIRECTED BY ANTICOAGULATION CLINIC (Patient taking differently: Take 2.5-5 mg by mouth See admin instructions. Take 5 mg on Sun, Tue, Wed, Thurs, and Sat. Take 2.5 mg on Mon and Fri.)    No facility-administered encounter medications on file as of 07/13/2020.    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) 01/25/2019   Diarrhea, unspecified 01/25/2019   Hypertensive chronic kidney disease with stage 5 chronic kidney disease or  end stage renal disease ([place]) 01/25/2019   Diverticulosis of intestine, part unspecified, without perforation or abscess without bleeding 01/25/2019   Other idiopathic peripheral autonomic neuropathy 01/25/2019   Hypokalemia 01/25/2019   Male erectile dysfunction, unspecified 01/25/2019   Pain, unspecified 01/25/2019   Primary generalized (osteo)arthritis 01/25/2019   Pruritus, unspecified 01/25/2019   Secondary  hyperparathyroidism of renal origin ([place]) 01/25/2019   Long term (current) use of anticoagulants 12/31/2018   Paroxysmal atrial fibrillation ([place]) 12/22/2018   Longstanding persistent atrial fibrillation ([place]) 09/20/2018   Functional diarrhea 07/14/2018   Benign essential HTN    ESRD on dialysis ([place]) 06/16/2018   Mild malnutrition ([place]) 06/16/2018   Stroke due to embolism ([place]) 06/16/2018   Chronic HFrEF (heart failure with reduced ejection fraction) ([place])    CVA (cerebral vascular accident) ([place]) 06/08/2018   CHF (congestive heart failure) ([place]) 05/08/2018   Hyperlipidemia LDL goal <70 09/23/2017   OAB (overactive bladder) 01/09/2016   SUI (stress urinary incontinence), male 01/09/2016   Hypersomnia 10/30/2015   Obesity (BMI 30.0-34.9) 10/30/2015   Routine general medical examination at a health care facility 06/04/2015   Medicare annual wellness visit, subsequent 06/04/2015   Hereditary and idiopathic peripheral neuropathy 09/14/2014   Rectal cancer ([place]) 02/01/2013   Obstructive sleep apnea 07/29/2010   GOUT 05/11/2007   ERECTILE DYSFUNCTION, MILD 05/11/2007   Essential hypertension 05/11/2007    Goals Addressed   None     Follow-Up:  Coordination of Enhanced Pharmacy Services   Received call from patient regarding medication management via [facility].  Patient requested an acute fill for Gabapentin to be delivered: 07/13/2020 Pharmacy needs refills? The medication being transferred from [facility] to [facility]  Confirmed delivery date of 07/13/2020, advised patient that pharmacy will contact them the morning of delivery.    Wendy Poet, [place] [phone]

## 2020-07-14 DIAGNOSIS — D509 Iron deficiency anemia, unspecified: Secondary | ICD-10-CM | POA: Diagnosis not present

## 2020-07-14 DIAGNOSIS — D689 Coagulation defect, unspecified: Secondary | ICD-10-CM | POA: Diagnosis not present

## 2020-07-14 DIAGNOSIS — T8249XD Other complication of vascular dialysis catheter, subsequent encounter: Secondary | ICD-10-CM | POA: Diagnosis not present

## 2020-07-14 DIAGNOSIS — N186 End stage renal disease: Secondary | ICD-10-CM | POA: Diagnosis not present

## 2020-07-14 DIAGNOSIS — N2581 Secondary hyperparathyroidism of renal origin: Secondary | ICD-10-CM | POA: Diagnosis not present

## 2020-07-14 DIAGNOSIS — D631 Anemia in chronic kidney disease: Secondary | ICD-10-CM | POA: Diagnosis not present

## 2020-07-14 DIAGNOSIS — Z992 Dependence on renal dialysis: Secondary | ICD-10-CM | POA: Diagnosis not present

## 2020-07-14 DIAGNOSIS — R197 Diarrhea, unspecified: Secondary | ICD-10-CM | POA: Diagnosis not present

## 2020-07-16 ENCOUNTER — Telehealth: Payer: Self-pay | Admitting: Pharmacist

## 2020-07-16 ENCOUNTER — Other Ambulatory Visit: Payer: Self-pay | Admitting: Cardiology

## 2020-07-16 ENCOUNTER — Other Ambulatory Visit: Payer: Self-pay | Admitting: Gastroenterology

## 2020-07-16 ENCOUNTER — Ambulatory Visit (INDEPENDENT_AMBULATORY_CARE_PROVIDER_SITE_OTHER): Payer: Medicare Other | Admitting: General Practice

## 2020-07-16 ENCOUNTER — Other Ambulatory Visit: Payer: Self-pay

## 2020-07-16 DIAGNOSIS — I480 Paroxysmal atrial fibrillation: Secondary | ICD-10-CM | POA: Diagnosis not present

## 2020-07-16 DIAGNOSIS — I5022 Chronic systolic (congestive) heart failure: Secondary | ICD-10-CM

## 2020-07-16 DIAGNOSIS — Z7901 Long term (current) use of anticoagulants: Secondary | ICD-10-CM

## 2020-07-16 LAB — POCT INR: INR: 1.8 — AB (ref 2.0–3.0)

## 2020-07-16 NOTE — Progress Notes (Signed)
Chronic Care Management Pharmacy Assistant   Name: Jason SOKOLOWSKI Sr.  MRN: [id number] DOB: [birth year]  Reason for Encounter: Medication Review   PCP : [name], MD  Allergies:   Allergies  Allergen Reactions   Nsaids Other (See Comments)    Asked by surgeon to add this medication class as intolerance due to patients renal insufficiency.   Lactase Diarrhea   Lactose Intolerance (Gi) Diarrhea   Phentermine Other (See Comments)    Unknown reaction   Amlodipine Other (See Comments)    Edema     Medications: Outpatient Encounter Medications as of 07/16/2020  Medication Sig Note   acetaminophen (TYLENOL) 325 MG tablet Take 650 mg by mouth every 4 (four) hours as needed for mild pain or moderate pain.     allopurinol (ZYLOPRIM) 100 MG tablet Take 1 tablet (100 mg total) by mouth daily.    atorvastatin (LIPITOR) 20 MG tablet Take 1 tablet (20 mg total) by mouth every evening.    BIDIL 20-37.5 MG tablet TAKE 1 TABLET BY MOUTH THREE TIMES DAILY    cinacalcet (SENSIPAR) 60 MG tablet Take 60 mg by mouth daily.    diphenoxylate-atropine (LOMOTIL) 2.5-0.025 MG tablet Take 1 tablet by mouth 4 (four) times daily.    FOSRENOL 1000 MG PACK Take 1,000 mg by mouth 3 (three) times daily with meals.     gabapentin (NEURONTIN) 100 MG capsule TAKE 1 CAPSULE BY MOUTH THREE TIMES DAILY    heparin 1000 unit/mL SOLN injection Heparin Sodium (Porcine) 1,000 Units/mL Catheter Lock Arterial    iron sucrose in sodium chloride 0.9 % 100 mL Iron Sucrose (Venofer)    lipase/protease/amylase (CREON) 36000 UNITS CPEP capsule Take 2 capsules (72,000 Units total) by mouth 3 (three) times daily with meals. May also take 1 capsule (36,000 Units total) as needed (with snacks). 04/03/2020: Via Durenda Hurt Assist PAP   Methoxy PEG-Epoetin Beta (MIRCERA IJ) Mircera    metoprolol succinate (TOPROL-XL) 25 MG 24 hr tablet Take 1 tablet (25 mg total) by mouth daily.    ondansetron (ZOFRAN) 4 MG tablet  Take 1 tablet (4 mg total) by mouth every 8 (eight) hours as needed.    polycarbophil (FIBERCON) 625 MG tablet Take 625 mg by mouth as needed for diarrhea or loose stools.     warfarin (COUMADIN) 5 MG tablet TAKE 1 TABLET BY MOUTH DAILY, EXCEPT 1/2 TABLET ON MONDAY AND FRIDAYS OR TAKE AS DIRECTED BY ANTICOAGULATION CLINIC (Patient taking differently: Take 2.5-5 mg by mouth See admin instructions. Take 5 mg on Sun, Tue, Wed, Thurs, and Sat. Take 2.5 mg on Mon and Fri.)    No facility-administered encounter medications on file as of 07/16/2020.    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) 05/23/2019   Gait disturbance, post-stroke 04/17/2019   Iron deficiency anemia, unspecified 03/14/2019   Chronic obstructive pulmonary disease, unspecified ([place]) 01/25/2019   Coagulation defect, unspecified ([place]) 01/25/2019   Diarrhea, unspecified 01/25/2019   Hypertensive chronic kidney disease with stage 5 chronic kidney disease  or end stage renal disease ([place]) 01/25/2019   Diverticulosis of intestine, part unspecified, without perforation or abscess without bleeding 01/25/2019   Other idiopathic peripheral autonomic neuropathy 01/25/2019   Hypokalemia 01/25/2019   Male erectile dysfunction, unspecified 01/25/2019   Pain, unspecified 01/25/2019   Primary generalized (osteo)arthritis 01/25/2019   Pruritus, unspecified 01/25/2019   Secondary  hyperparathyroidism of renal origin ([place]) 01/25/2019   Long term (current) use of anticoagulants 12/31/2018   Paroxysmal atrial fibrillation ([place]) 12/22/2018   Longstanding persistent atrial fibrillation ([place]) 09/20/2018   Functional diarrhea 07/14/2018   Benign essential HTN    ESRD on dialysis ([place]) 06/16/2018   Mild malnutrition ([place]) 06/16/2018   Stroke due to embolism ([place]) 06/16/2018   Chronic HFrEF (heart failure with reduced ejection fraction) ([place])    CVA (cerebral vascular accident) ([place]) 06/08/2018   CHF (congestive heart failure) ([employer]) 05/08/2018   Hyperlipidemia LDL goal <70 09/23/2017   OAB (overactive bladder) 01/09/2016   SUI (stress urinary incontinence), male 01/09/2016   Hypersomnia 10/30/2015   Obesity (BMI 30.0-34.9) 10/30/2015   Routine general medical examination at a health care facility 06/04/2015   Medicare annual wellness visit, subsequent 06/04/2015   Hereditary and idiopathic peripheral neuropathy 09/14/2014   Rectal cancer ([place]) 02/01/2013   Obstructive sleep apnea 07/29/2010   GOUT 05/11/2007   ERECTILE DYSFUNCTION, MILD 05/11/2007   Essential hypertension 05/11/2007    Goals Addressed   None     Follow-Up:  Coordination of Enhanced Pharmacy Services   Received call from patient regarding medication management via [facility].  Patient requested an acute fill for Ondansetron 4 mg every 8 hours as needed to be delivered: 07/17/2020 Pharmacy needs refills? The patient does not need refills  Confirmed delivery date of 07/17/2020, advised patient that pharmacy will contact them the morning of delivery.  Wendy Poet, [place] [phone]

## 2020-07-16 NOTE — Patient Instructions (Addendum)
Pre visit review using our clinic review tool, if applicable. No additional management support is needed unless otherwise documented below in the visit note.  Change dosage and take 1 tablet daily.  Re-check in 4 weeks.

## 2020-07-17 DIAGNOSIS — N2581 Secondary hyperparathyroidism of renal origin: Secondary | ICD-10-CM | POA: Diagnosis not present

## 2020-07-17 DIAGNOSIS — Z992 Dependence on renal dialysis: Secondary | ICD-10-CM | POA: Diagnosis not present

## 2020-07-17 DIAGNOSIS — D509 Iron deficiency anemia, unspecified: Secondary | ICD-10-CM | POA: Diagnosis not present

## 2020-07-17 DIAGNOSIS — N186 End stage renal disease: Secondary | ICD-10-CM | POA: Diagnosis not present

## 2020-07-17 DIAGNOSIS — D631 Anemia in chronic kidney disease: Secondary | ICD-10-CM | POA: Diagnosis not present

## 2020-07-17 DIAGNOSIS — R197 Diarrhea, unspecified: Secondary | ICD-10-CM | POA: Diagnosis not present

## 2020-07-17 DIAGNOSIS — D689 Coagulation defect, unspecified: Secondary | ICD-10-CM | POA: Diagnosis not present

## 2020-07-17 DIAGNOSIS — T8249XD Other complication of vascular dialysis catheter, subsequent encounter: Secondary | ICD-10-CM | POA: Diagnosis not present

## 2020-07-18 ENCOUNTER — Other Ambulatory Visit: Payer: Self-pay | Admitting: Cardiology

## 2020-07-18 DIAGNOSIS — I5022 Chronic systolic (congestive) heart failure: Secondary | ICD-10-CM

## 2020-07-19 DIAGNOSIS — D631 Anemia in chronic kidney disease: Secondary | ICD-10-CM | POA: Diagnosis not present

## 2020-07-19 DIAGNOSIS — Z992 Dependence on renal dialysis: Secondary | ICD-10-CM | POA: Diagnosis not present

## 2020-07-19 DIAGNOSIS — D509 Iron deficiency anemia, unspecified: Secondary | ICD-10-CM | POA: Diagnosis not present

## 2020-07-19 DIAGNOSIS — N2581 Secondary hyperparathyroidism of renal origin: Secondary | ICD-10-CM | POA: Diagnosis not present

## 2020-07-19 DIAGNOSIS — D689 Coagulation defect, unspecified: Secondary | ICD-10-CM | POA: Diagnosis not present

## 2020-07-19 DIAGNOSIS — T8249XD Other complication of vascular dialysis catheter, subsequent encounter: Secondary | ICD-10-CM | POA: Diagnosis not present

## 2020-07-19 DIAGNOSIS — N186 End stage renal disease: Secondary | ICD-10-CM | POA: Diagnosis not present

## 2020-07-19 DIAGNOSIS — R197 Diarrhea, unspecified: Secondary | ICD-10-CM | POA: Diagnosis not present

## 2020-07-20 DIAGNOSIS — N186 End stage renal disease: Secondary | ICD-10-CM | POA: Diagnosis not present

## 2020-07-20 DIAGNOSIS — Z452 Encounter for adjustment and management of vascular access device: Secondary | ICD-10-CM | POA: Diagnosis not present

## 2020-07-20 DIAGNOSIS — Z992 Dependence on renal dialysis: Secondary | ICD-10-CM | POA: Diagnosis not present

## 2020-07-21 DIAGNOSIS — T8249XD Other complication of vascular dialysis catheter, subsequent encounter: Secondary | ICD-10-CM | POA: Diagnosis not present

## 2020-07-21 DIAGNOSIS — D631 Anemia in chronic kidney disease: Secondary | ICD-10-CM | POA: Diagnosis not present

## 2020-07-21 DIAGNOSIS — N186 End stage renal disease: Secondary | ICD-10-CM | POA: Diagnosis not present

## 2020-07-21 DIAGNOSIS — D689 Coagulation defect, unspecified: Secondary | ICD-10-CM | POA: Diagnosis not present

## 2020-07-21 DIAGNOSIS — D509 Iron deficiency anemia, unspecified: Secondary | ICD-10-CM | POA: Diagnosis not present

## 2020-07-21 DIAGNOSIS — R197 Diarrhea, unspecified: Secondary | ICD-10-CM | POA: Diagnosis not present

## 2020-07-21 DIAGNOSIS — N2581 Secondary hyperparathyroidism of renal origin: Secondary | ICD-10-CM | POA: Diagnosis not present

## 2020-07-21 DIAGNOSIS — Z992 Dependence on renal dialysis: Secondary | ICD-10-CM | POA: Diagnosis not present

## 2020-07-23 ENCOUNTER — Telehealth: Payer: Self-pay | Admitting: Pharmacist

## 2020-07-23 NOTE — Progress Notes (Signed)
Chronic Care Management Pharmacy Assistant   Name: Jason SALEHI Sr.  MRN: [id number] DOB: November 01, 1945  Reason for Encounter: Medication Review   PCP : [name], MD  Allergies:   Allergies  Allergen Reactions   Nsaids Other (See Comments)    Asked by surgeon to add this medication class as intolerance due to patients renal insufficiency.   Lactase Diarrhea   Lactose Intolerance (Gi) Diarrhea   Phentermine Other (See Comments)    Unknown reaction   Amlodipine Other (See Comments)    Edema     Medications: Outpatient Encounter Medications as of 07/23/2020  Medication Sig Note   acetaminophen (TYLENOL) 325 MG tablet Take 650 mg by mouth every 4 (four) hours as needed for mild pain or moderate pain.     allopurinol (ZYLOPRIM) 100 MG tablet Take 1 tablet (100 mg total) by mouth daily.    atorvastatin (LIPITOR) 20 MG tablet Take 1 tablet (20 mg total) by mouth every evening.    BIDIL 20-37.5 MG tablet TAKE 1 TABLET BY MOUTH THREE TIMES DAILY    cinacalcet (SENSIPAR) 60 MG tablet Take 60 mg by mouth daily.    diphenoxylate-atropine (LOMOTIL) 2.5-0.025 MG tablet Take 1 tablet by mouth 4 (four) times daily.    FOSRENOL 1000 MG PACK Take 1,000 mg by mouth 3 (three) times daily with meals.     gabapentin (NEURONTIN) 100 MG capsule TAKE 1 CAPSULE BY MOUTH THREE TIMES DAILY    heparin 1000 unit/mL SOLN injection Heparin Sodium (Porcine) 1,000 Units/mL Catheter Lock Arterial    iron sucrose in sodium chloride 0.9 % 100 mL Iron Sucrose (Venofer)    lipase/protease/amylase (CREON) 36000 UNITS CPEP capsule Take 2 capsules (72,000 Units total) by mouth 3 (three) times daily with meals. May also take 1 capsule (36,000 Units total) as needed (with snacks). 04/03/2020: Via Durenda Hurt Assist PAP   Methoxy PEG-Epoetin Beta (MIRCERA IJ) Mircera    metoprolol succinate (TOPROL-XL) 25 MG 24 hr tablet Take 1 tablet (25 mg total) by mouth daily.    ondansetron (ZOFRAN) 4 MG tablet  TAKE ONE TABLET BY MOUTH EVERY 8 HOURS AS NEEDED    polycarbophil (FIBERCON) 625 MG tablet Take 625 mg by mouth as needed for diarrhea or loose stools.     warfarin (COUMADIN) 5 MG tablet TAKE 1 TABLET BY MOUTH DAILY, EXCEPT 1/2 TABLET ON MONDAY AND FRIDAYS OR TAKE AS DIRECTED BY ANTICOAGULATION CLINIC (Patient taking differently: Take 2.5-5 mg by mouth See admin instructions. Take 5 mg on Sun, Tue, Wed, Thurs, and Sat. Take 2.5 mg on Mon and Fri.)    No facility-administered encounter medications on file as of 07/23/2020.    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) 05/23/2019   Gait disturbance, post-stroke 04/17/2019   Iron deficiency anemia, unspecified 03/14/2019   Chronic obstructive pulmonary disease, unspecified ([place]) 01/25/2019   Coagulation defect, unspecified ([place]) 01/25/2019   Diarrhea, unspecified 01/25/2019   Hypertensive chronic kidney disease with stage 5 chronic kidney disease or end stage renal  disease ([place]) 01/25/2019   Diverticulosis of intestine, part unspecified, without perforation or abscess without bleeding 01/25/2019   Other idiopathic peripheral autonomic neuropathy 01/25/2019   Hypokalemia 01/25/2019   Male erectile dysfunction, unspecified 01/25/2019   Pain, unspecified 01/25/2019   Primary generalized (osteo)arthritis 01/25/2019   Pruritus, unspecified 01/25/2019   Secondary hyperparathyroidism  of renal origin ([place]) 01/25/2019   Long term (current) use of anticoagulants 12/31/2018   Paroxysmal atrial fibrillation ([place]) 12/22/2018   Longstanding persistent atrial fibrillation ([place]) 09/20/2018   Functional diarrhea 07/14/2018   Benign essential HTN    ESRD on dialysis ([place]) 06/16/2018   Mild malnutrition ([place]) 06/16/2018   Stroke due to embolism ([place]) 06/16/2018   Chronic HFrEF (heart failure with reduced ejection fraction) ([place])    CVA (cerebral vascular accident) ([place]) 06/08/2018   CHF (congestive heart failure) ([place]) 05/08/2018   Hyperlipidemia LDL goal <70 09/23/2017   OAB (overactive bladder) 01/09/2016   SUI (stress urinary incontinence), male 01/09/2016   Hypersomnia 10/30/2015   Obesity (BMI 30.0-34.9) 10/30/2015   Routine general medical examination at a health care facility 06/04/2015   Medicare annual wellness visit, subsequent 06/04/2015   Hereditary and idiopathic peripheral neuropathy 09/14/2014   Rectal cancer ([place]) 02/01/2013   Obstructive sleep apnea 07/29/2010   GOUT 05/11/2007   ERECTILE DYSFUNCTION, MILD 05/11/2007   Essential hypertension 05/11/2007    Goals Addressed   None     Follow-Up:  Coordination of [facility]   Received call from patient regarding medication management via [facility].  Patient requested an acute fill for Ondansetron to be delivered 07/27/2020 Pharmacy needs refills? No  Confirmed delivery date of 07/27/2020, advised patient that pharmacy will contact them the morning of delivery.  Wendy Poet, [place] [phone]

## 2020-07-23 NOTE — Progress Notes (Signed)
Chronic Care Management Pharmacy Assistant   Name: Jason HANF Sr.  MRN: [id number] DOB: 1946-01-24  Reason for Encounter: Chart Review  PCP : [name], MD  Allergies:   Allergies  Allergen Reactions  . Nsaids Other (See Comments)    Asked by surgeon to add this medication class as intolerance due to patients renal insufficiency.  . Lactase Diarrhea  . Lactose Intolerance (Gi) Diarrhea  . Phentermine Other (See Comments)    Unknown reaction  . Amlodipine Other (See Comments)    Edema     Medications: Outpatient Encounter Medications as of 07/23/2020  Medication Sig Note  . acetaminophen (TYLENOL) 325 MG tablet Take 650 mg by mouth every 4 (four) hours as needed for mild pain or moderate pain.    Marland Kitchen allopurinol (ZYLOPRIM) 100 MG tablet Take 1 tablet (100 mg total) by mouth daily.   Marland Kitchen atorvastatin (LIPITOR) 20 MG tablet Take 1 tablet (20 mg total) by mouth every evening.   Marland Kitchen BIDIL 20-37.5 MG tablet TAKE 1 TABLET BY MOUTH THREE TIMES DAILY   . cinacalcet (SENSIPAR) 60 MG tablet Take 60 mg by mouth daily.   . diphenoxylate-atropine (LOMOTIL) 2.5-0.025 MG tablet Take 1 tablet by mouth 4 (four) times daily.   Marland Kitchen FOSRENOL 1000 MG PACK Take 1,000 mg by mouth 3 (three) times daily with meals.    . gabapentin (NEURONTIN) 100 MG capsule TAKE 1 CAPSULE BY MOUTH THREE TIMES DAILY   . heparin 1000 unit/mL SOLN injection Heparin Sodium (Porcine) 1,000 Units/mL Catheter Lock Arterial   . iron sucrose in sodium chloride 0.9 % 100 mL Iron Sucrose (Venofer)   . lipase/protease/amylase (CREON) 36000 UNITS CPEP capsule Take 2 capsules (72,000 Units total) by mouth 3 (three) times daily with meals. May also take 1 capsule (36,000 Units total) as needed (with snacks). 04/03/2020: Via Durenda Hurt Assist PAP  . Methoxy PEG-Epoetin Beta (MIRCERA IJ) Mircera   . metoprolol succinate (TOPROL-XL) 25 MG 24 hr tablet Take 1 tablet (25 mg total) by mouth daily.   . ondansetron (ZOFRAN) 4 MG tablet TAKE  ONE TABLET BY MOUTH EVERY 8 HOURS AS NEEDED   . polycarbophil (FIBERCON) 625 MG tablet Take 625 mg by mouth as needed for diarrhea or loose stools.    . warfarin (COUMADIN) 5 MG tablet TAKE 1 TABLET BY MOUTH DAILY, EXCEPT 1/2 TABLET ON MONDAY AND FRIDAYS OR TAKE AS DIRECTED BY ANTICOAGULATION CLINIC (Patient taking differently: Take 2.5-5 mg by mouth See admin instructions. Take 5 mg on Sun, Tue, Wed, Thurs, and Sat. Take 2.5 mg on Mon and Fri.)    No facility-administered encounter medications on file as of 07/23/2020.    Current Diagnosis: Patient Active Problem List   Diagnosis Date Noted  . Complication of vascular dialysis catheter 04/18/2020  . Other disorders of phosphorus metabolism 04/04/2020  . Allergy, unspecified, initial encounter 03/01/2020  . Anaphylactic shock, unspecified, initial encounter 03/01/2020  . Viral upper respiratory tract infection 02/13/2020  . Fever 01/16/2020  . Acute prostatitis with hematuria 01/16/2020  . Benign prostatic hyperplasia without lower urinary tract symptoms 12/12/2019  . Meralgia paraesthetica, right 08/12/2019  . 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 Gait disturbance, post-stroke 04/17/2019  . Iron deficiency anemia, unspecified 03/14/2019  . Chronic obstructive pulmonary disease, unspecified ([place]) 01/25/2019  . Coagulation defect, unspecified ([place]) 01/25/2019  . Diarrhea, unspecified 01/25/2019  . Hypertensive chronic kidney disease with stage 5 chronic kidney disease or end stage renal disease (  [place]) 01/25/2019  . Diverticulosis of intestine, part unspecified, without perforation or abscess without bleeding 01/25/2019  . Other idiopathic peripheral autonomic neuropathy 01/25/2019  . Hypokalemia 01/25/2019  . Male erectile dysfunction, unspecified 01/25/2019  . Pain, unspecified 01/25/2019  . Primary generalized (osteo)arthritis 01/25/2019  . Pruritus, unspecified 01/25/2019  . Secondary hyperparathyroidism of  renal origin ([place]) 01/25/2019  . Long term (current) use of anticoagulants 12/31/2018  . Paroxysmal atrial fibrillation ([place]) 12/22/2018  . Longstanding persistent atrial fibrillation ([place]) 09/20/2018  . Functional diarrhea 07/14/2018  . Benign essential HTN   . ESRD on dialysis ([place]) 06/16/2018  . Mild malnutrition ([place]) 06/16/2018  . Stroke due to embolism ([place]) 06/16/2018  . Chronic HFrEF (heart failure with reduced ejection fraction) ([place])   . CVA (cerebral vascular accident) ([place]) 06/08/2018  . CHF (congestive heart failure) ([place]) 05/08/2018  . Hyperlipidemia LDL goal <70 09/23/2017  . OAB (overactive bladder) 01/09/2016  . SUI (stress urinary incontinence), male 01/09/2016  . Hypersomnia 10/30/2015  . Obesity (BMI 30.0-34.9) 10/30/2015  . Routine general medical examination at a health care facility 06/04/2015  . Medicare annual wellness visit, subsequent 06/04/2015  . Hereditary and idiopathic peripheral neuropathy 09/14/2014  . Rectal cancer ([place]) 02/01/2013  . Obstructive sleep apnea 07/29/2010  . GOUT 05/11/2007  . ERECTILE DYSFUNCTION, MILD 05/11/2007  . Essential hypertension 05/11/2007    Goals Addressed   None     Follow-Up:  Pharmacist Review    Reviewed the patients chart the patient spoke with CPA Olivia Mackie today was going to call to follow up with the patient to see if he need an medications but the patient is already scheduled for a delivery on 07/27/2020.  Rosendo Gros, [facility]  Practice Team Manager/ CPA (Clinical Pharmacist Assistant) [phone]

## 2020-07-24 DIAGNOSIS — N186 End stage renal disease: Secondary | ICD-10-CM | POA: Diagnosis not present

## 2020-07-24 DIAGNOSIS — T8249XD Other complication of vascular dialysis catheter, subsequent encounter: Secondary | ICD-10-CM | POA: Diagnosis not present

## 2020-07-24 DIAGNOSIS — D509 Iron deficiency anemia, unspecified: Secondary | ICD-10-CM | POA: Diagnosis not present

## 2020-07-24 DIAGNOSIS — Z992 Dependence on renal dialysis: Secondary | ICD-10-CM | POA: Diagnosis not present

## 2020-07-24 DIAGNOSIS — D689 Coagulation defect, unspecified: Secondary | ICD-10-CM | POA: Diagnosis not present

## 2020-07-24 DIAGNOSIS — D631 Anemia in chronic kidney disease: Secondary | ICD-10-CM | POA: Diagnosis not present

## 2020-07-24 DIAGNOSIS — R197 Diarrhea, unspecified: Secondary | ICD-10-CM | POA: Diagnosis not present

## 2020-07-24 DIAGNOSIS — N2581 Secondary hyperparathyroidism of renal origin: Secondary | ICD-10-CM | POA: Diagnosis not present

## 2020-07-26 DIAGNOSIS — R197 Diarrhea, unspecified: Secondary | ICD-10-CM | POA: Diagnosis not present

## 2020-07-26 DIAGNOSIS — N186 End stage renal disease: Secondary | ICD-10-CM | POA: Diagnosis not present

## 2020-07-26 DIAGNOSIS — D631 Anemia in chronic kidney disease: Secondary | ICD-10-CM | POA: Diagnosis not present

## 2020-07-26 DIAGNOSIS — D689 Coagulation defect, unspecified: Secondary | ICD-10-CM | POA: Diagnosis not present

## 2020-07-26 DIAGNOSIS — T8249XD Other complication of vascular dialysis catheter, subsequent encounter: Secondary | ICD-10-CM | POA: Diagnosis not present

## 2020-07-26 DIAGNOSIS — N2581 Secondary hyperparathyroidism of renal origin: Secondary | ICD-10-CM | POA: Diagnosis not present

## 2020-07-26 DIAGNOSIS — Z992 Dependence on renal dialysis: Secondary | ICD-10-CM | POA: Diagnosis not present

## 2020-07-26 DIAGNOSIS — D509 Iron deficiency anemia, unspecified: Secondary | ICD-10-CM | POA: Diagnosis not present

## 2020-07-28 DIAGNOSIS — N2581 Secondary hyperparathyroidism of renal origin: Secondary | ICD-10-CM | POA: Diagnosis not present

## 2020-07-28 DIAGNOSIS — D509 Iron deficiency anemia, unspecified: Secondary | ICD-10-CM | POA: Diagnosis not present

## 2020-07-28 DIAGNOSIS — R197 Diarrhea, unspecified: Secondary | ICD-10-CM | POA: Diagnosis not present

## 2020-07-28 DIAGNOSIS — T8249XD Other complication of vascular dialysis catheter, subsequent encounter: Secondary | ICD-10-CM | POA: Diagnosis not present

## 2020-07-28 DIAGNOSIS — D631 Anemia in chronic kidney disease: Secondary | ICD-10-CM | POA: Diagnosis not present

## 2020-07-28 DIAGNOSIS — D689 Coagulation defect, unspecified: Secondary | ICD-10-CM | POA: Diagnosis not present

## 2020-07-28 DIAGNOSIS — Z992 Dependence on renal dialysis: Secondary | ICD-10-CM | POA: Diagnosis not present

## 2020-07-28 DIAGNOSIS — N186 End stage renal disease: Secondary | ICD-10-CM | POA: Diagnosis not present

## 2020-07-31 DIAGNOSIS — T8249XD Other complication of vascular dialysis catheter, subsequent encounter: Secondary | ICD-10-CM | POA: Diagnosis not present

## 2020-07-31 DIAGNOSIS — N2581 Secondary hyperparathyroidism of renal origin: Secondary | ICD-10-CM | POA: Diagnosis not present

## 2020-07-31 DIAGNOSIS — D689 Coagulation defect, unspecified: Secondary | ICD-10-CM | POA: Diagnosis not present

## 2020-07-31 DIAGNOSIS — Z992 Dependence on renal dialysis: Secondary | ICD-10-CM | POA: Diagnosis not present

## 2020-07-31 DIAGNOSIS — R197 Diarrhea, unspecified: Secondary | ICD-10-CM | POA: Diagnosis not present

## 2020-07-31 DIAGNOSIS — D631 Anemia in chronic kidney disease: Secondary | ICD-10-CM | POA: Diagnosis not present

## 2020-07-31 DIAGNOSIS — N186 End stage renal disease: Secondary | ICD-10-CM | POA: Diagnosis not present

## 2020-07-31 DIAGNOSIS — D509 Iron deficiency anemia, unspecified: Secondary | ICD-10-CM | POA: Diagnosis not present

## 2020-08-02 DIAGNOSIS — D689 Coagulation defect, unspecified: Secondary | ICD-10-CM | POA: Diagnosis not present

## 2020-08-02 DIAGNOSIS — R197 Diarrhea, unspecified: Secondary | ICD-10-CM | POA: Diagnosis not present

## 2020-08-02 DIAGNOSIS — D509 Iron deficiency anemia, unspecified: Secondary | ICD-10-CM | POA: Diagnosis not present

## 2020-08-02 DIAGNOSIS — N186 End stage renal disease: Secondary | ICD-10-CM | POA: Diagnosis not present

## 2020-08-02 DIAGNOSIS — Z992 Dependence on renal dialysis: Secondary | ICD-10-CM | POA: Diagnosis not present

## 2020-08-02 DIAGNOSIS — D631 Anemia in chronic kidney disease: Secondary | ICD-10-CM | POA: Diagnosis not present

## 2020-08-02 DIAGNOSIS — T8249XD Other complication of vascular dialysis catheter, subsequent encounter: Secondary | ICD-10-CM | POA: Diagnosis not present

## 2020-08-02 DIAGNOSIS — N2581 Secondary hyperparathyroidism of renal origin: Secondary | ICD-10-CM | POA: Diagnosis not present

## 2020-08-03 ENCOUNTER — Other Ambulatory Visit: Payer: Self-pay | Admitting: Gastroenterology

## 2020-08-06 ENCOUNTER — Other Ambulatory Visit: Payer: Self-pay

## 2020-08-06 ENCOUNTER — Encounter: Payer: Self-pay | Admitting: Internal Medicine

## 2020-08-06 ENCOUNTER — Ambulatory Visit (INDEPENDENT_AMBULATORY_CARE_PROVIDER_SITE_OTHER): Payer: Medicare Other | Admitting: Internal Medicine

## 2020-08-06 ENCOUNTER — Telehealth: Payer: Self-pay | Admitting: Pharmacist

## 2020-08-06 DIAGNOSIS — I509 Heart failure, unspecified: Secondary | ICD-10-CM | POA: Diagnosis not present

## 2020-08-06 DIAGNOSIS — L03115 Cellulitis of right lower limb: Secondary | ICD-10-CM | POA: Diagnosis not present

## 2020-08-06 DIAGNOSIS — I10 Essential (primary) hypertension: Secondary | ICD-10-CM

## 2020-08-06 DIAGNOSIS — N186 End stage renal disease: Secondary | ICD-10-CM | POA: Diagnosis not present

## 2020-08-06 DIAGNOSIS — Z992 Dependence on renal dialysis: Secondary | ICD-10-CM | POA: Diagnosis not present

## 2020-08-06 DIAGNOSIS — I129 Hypertensive chronic kidney disease with stage 1 through stage 4 chronic kidney disease, or unspecified chronic kidney disease: Secondary | ICD-10-CM | POA: Diagnosis not present

## 2020-08-06 MED ORDER — DOXYCYCLINE HYCLATE 100 MG PO TABS: 100.0000 mg | ORAL_TABLET | Freq: Two times a day (BID) | ORAL | 0 refills | Status: DC

## 2020-08-06 NOTE — Progress Notes (Signed)
Chronic Care Management Pharmacy Assistant   Name: Jason FAUGHN Sr.  MRN: [id number] DOB: [birth year]  Reason for Encounter: Chart Review   PCP : [name], MD  Allergies:   Allergies  Allergen Reactions   Nsaids Other (See Comments)    Asked by surgeon to add this medication class as intolerance due to patients renal insufficiency.   Lactase Diarrhea   Lactose Intolerance (Gi) Diarrhea   Phentermine Other (See Comments)    Unknown reaction   Amlodipine Other (See Comments)    Edema     Medications: Outpatient Encounter Medications as of 08/06/2020  Medication Sig Note   acetaminophen (TYLENOL) 325 MG tablet Take 650 mg by mouth every 4 (four) hours as needed for mild pain or moderate pain.     allopurinol (ZYLOPRIM) 100 MG tablet Take 1 tablet (100 mg total) by mouth daily.    atorvastatin (LIPITOR) 20 MG tablet Take 1 tablet (20 mg total) by mouth every evening.    BIDIL 20-37.5 MG tablet TAKE 1 TABLET BY MOUTH THREE TIMES DAILY    cinacalcet (SENSIPAR) 60 MG tablet Take 60 mg by mouth daily.    diphenoxylate-atropine (LOMOTIL) 2.5-0.025 MG tablet Take 1 tablet by mouth 4 (four) times daily.    FOSRENOL 1000 MG PACK Take 1,000 mg by mouth 3 (three) times daily with meals.     gabapentin (NEURONTIN) 100 MG capsule TAKE 1 CAPSULE BY MOUTH THREE TIMES DAILY    heparin 1000 unit/mL SOLN injection Heparin Sodium (Porcine) 1,000 Units/mL Catheter Lock Arterial    iron sucrose in sodium chloride 0.9 % 100 mL Iron Sucrose (Venofer)    lipase/protease/amylase (CREON) 36000 UNITS CPEP capsule Take 2 capsules (72,000 Units total) by mouth 3 (three) times daily with meals. May also take 1 capsule (36,000 Units total) as needed (with snacks). 04/03/2020: Via Durenda Hurt Assist PAP   Methoxy PEG-Epoetin Beta (MIRCERA IJ) Mircera    metoprolol succinate (TOPROL-XL) 25 MG 24 hr tablet Take 1 tablet (25 mg total) by mouth daily.    ondansetron (ZOFRAN) 4 MG tablet TAKE  ONE TABLET BY MOUTH EVERY 8 HOURS AS NEEDED    polycarbophil (FIBERCON) 625 MG tablet Take 625 mg by mouth as needed for diarrhea or loose stools.     warfarin (COUMADIN) 5 MG tablet TAKE 1 TABLET BY MOUTH DAILY, EXCEPT 1/2 TABLET ON MONDAY AND FRIDAYS OR TAKE AS DIRECTED BY ANTICOAGULATION CLINIC (Patient taking differently: Take 2.5-5 mg by mouth See admin instructions. Take 5 mg on Sun, Tue, Wed, Thurs, and Sat. Take 2.5 mg on Mon and Fri.)    No facility-administered encounter medications on file as of 08/06/2020.    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) 05/23/2019   Gait disturbance, post-stroke 04/17/2019   Iron deficiency anemia, unspecified 03/14/2019   Chronic obstructive pulmonary disease, unspecified ([place]) 01/25/2019   Coagulation defect, unspecified ([place]) 01/25/2019   Diarrhea, unspecified 01/25/2019   Hypertensive chronic kidney disease with stage 5 chronic kidney disease or end stage renal  disease ([place]) 01/25/2019   Diverticulosis of intestine, part unspecified, without perforation or abscess without bleeding 01/25/2019   Other idiopathic peripheral autonomic neuropathy 01/25/2019   Hypokalemia 01/25/2019   Male erectile dysfunction, unspecified 01/25/2019   Pain, unspecified 01/25/2019   Primary generalized (osteo)arthritis 01/25/2019   Pruritus, unspecified 01/25/2019   Secondary hyperparathyroidism of  renal origin ([place]) 01/25/2019   Long term (current) use of anticoagulants 12/31/2018   Paroxysmal atrial fibrillation ([place]) 12/22/2018   Longstanding persistent atrial fibrillation ([place]) 09/20/2018   Functional diarrhea 07/14/2018   Benign essential HTN    ESRD on dialysis ([place]) 06/16/2018   Mild malnutrition ([place]) 06/16/2018   Stroke due to embolism ([place]) 06/16/2018   Chronic HFrEF (heart failure with reduced ejection fraction) ([place])    CVA (cerebral vascular accident) ([place]) 06/08/2018   CHF (congestive heart failure) ([place]) 05/08/2018   Hyperlipidemia LDL goal <70 09/23/2017   OAB (overactive bladder) 01/09/2016   SUI (stress urinary incontinence), male 01/09/2016   Hypersomnia 10/30/2015   Obesity (BMI 30.0-34.9) 10/30/2015   Routine general medical examination at a health care facility 06/04/2015   Medicare annual wellness visit, subsequent 06/04/2015   Hereditary and idiopathic peripheral neuropathy 09/14/2014   Rectal cancer ([place]) 02/01/2013   Obstructive sleep apnea 07/29/2010   GOUT 05/11/2007   ERECTILE DYSFUNCTION, MILD 05/11/2007   Essential hypertension 05/11/2007    Goals Addressed   None     Follow-Up:  Pharmacist Review    Reviewed chart for medication changes and adherence.   No gaps in adherence identified. Patient has follow up scheduled with pharmacy team. No further action required.    Wendy Poet, [place] [phone]

## 2020-08-06 NOTE — Patient Instructions (Signed)
Please take all new medication as prescribed - the antibiotic  Please continue all other medications as before, and refills have been done if requested.  Please have the pharmacy call with any other refills you may need.  Please keep your appointments with your specialists as you may have planned   

## 2020-08-06 NOTE — Assessment & Plan Note (Signed)
Volume stable, no other LE swelling or s/s volume overload, cont same tx - bidil, toprol

## 2020-08-06 NOTE — Assessment & Plan Note (Signed)
Mild to mod, for antibx course,  to f/u any worsening symptoms or concerns 

## 2020-08-06 NOTE — Assessment & Plan Note (Signed)
BP Readings from Last 3 Encounters:  08/06/20 110/68  06/18/20 105/62  06/13/20 (!) 108/56   Stable, pt to continue medical treatment  - bidil, toprol  * Current Outpatient Medications (Endocrine & Metabolic):  .  cinacalcet (SENSIPAR) 60 MG tablet, Take 60 mg by mouth daily.  Current Outpatient Medications (Cardiovascular):  .  atorvastatin (LIPITOR) 20 MG tablet, Take 1 tablet (20 mg total) by mouth every evening. Marland Kitchen  BIDIL 20-37.5 MG tablet, TAKE 1 TABLET BY MOUTH THREE TIMES DAILY .  metoprolol succinate (TOPROL-XL) 25 MG 24 hr tablet, Take 1 tablet (25 mg total) by mouth daily.   Current Outpatient Medications (Analgesics):  .  acetaminophen (TYLENOL) 325 MG tablet, Take 650 mg by mouth every 4 (four) hours as needed for mild pain or moderate pain.  Marland Kitchen  allopurinol (ZYLOPRIM) 100 MG tablet, Take 1 tablet (100 mg total) by mouth daily.  Current Outpatient Medications (Hematological):  .  heparin 1000 unit/mL SOLN injection, Heparin Sodium (Porcine) 1,000 Units/mL Catheter Lock Arterial .  iron sucrose in sodium chloride 0.9 % 100 mL, Iron Sucrose (Venofer) .  Methoxy PEG-Epoetin Beta (MIRCERA IJ), Mircera .  warfarin (COUMADIN) 5 MG tablet, TAKE 1 TABLET BY MOUTH DAILY, EXCEPT 1/2 TABLET ON MONDAY AND FRIDAYS OR TAKE AS DIRECTED BY ANTICOAGULATION CLINIC (Patient taking differently: Take 2.5-5 mg by mouth See admin instructions. Take 5 mg on Sun, Tue, Wed, Thurs, and Sat. Take 2.5 mg on Mon and Fri.)  Current Outpatient Medications (Other):  .  diphenoxylate-atropine (LOMOTIL) 2.5-0.025 MG tablet, Take 1 tablet by mouth 4 (four) times daily. Marland Kitchen  doxycycline (VIBRA-TABS) 100 MG tablet, Take 1 tablet (100 mg total) by mouth 2 (two) times daily. Marland Kitchen  FOSRENOL 1000 MG PACK, Take 1,000 mg by mouth 3 (three) times daily with meals.  .  gabapentin (NEURONTIN) 100 MG capsule, TAKE 1 CAPSULE BY MOUTH THREE TIMES DAILY .  lipase/protease/amylase (CREON) 36000 UNITS CPEP capsule, Take 2 capsules  (72,000 Units total) by mouth 3 (three) times daily with meals. May also take 1 capsule (36,000 Units total) as needed (with snacks). .  ondansetron (ZOFRAN) 4 MG tablet, TAKE ONE TABLET BY MOUTH EVERY 8 HOURS AS NEEDED .  polycarbophil (FIBERCON) 625 MG tablet, Take 625 mg by mouth as needed for diarrhea or loose stools.

## 2020-08-06 NOTE — Progress Notes (Signed)
Established Patient Office Visit  Subjective:  Patient ID: Jason SUAZO Sr., male    DOB: 15-Jul-1945  Age: 75 y.o. MRN: [id number]       Chief Complaint: right mid leg pain and redness       HPI:  Jason NOYCE Sr. is a 75 y.o. male here with c/o right mid leg pain, redness and moderate area swelling after striking the mid leg on a coffee table, denies fever, chills but symptoms began yesterday, getting worse today and figured to get treated soon to avoid worsening and hospn for cellulitis.  No overt laceration or wound. .    Pt denies chest pain, increased sob or doe, wheezing, orthopnea, PND, increased LE swelling, palpitations, dizziness or syncope.   Pt denies polydipsia, polyuria   Wt Readings from Last 3 Encounters:  08/06/20 144 lb (65.3 kg)  06/18/20 145 lb (65.8 kg)  06/13/20 140 lb 8 oz (63.7 kg)   BP Readings from Last 3 Encounters:  08/06/20 110/68  06/18/20 105/62  06/13/20 (!) 108/56         Past Medical History:  Diagnosis Date  . Anemia   . Arthritis   . Atrial fibrillation ([place])    on Coumadin   . Chronic renal insufficiency 07/31/2011  . COPD (chronic obstructive pulmonary disease) ([place])    pt denies  . CVA (cerebral vascular accident) ([place]) 06/2018  . Deficiency anemia 07/20/2012  . Diverticulosis   . Edema 09/21/2010   Qualifier: Diagnosis of  By: Jerold Coombe    . ERECTILE DYSFUNCTION, MILD 05/11/2007   Qualifier: Diagnosis of  By: Sherren Mocha MD, Jory Ee   . Exertional dyspnea 10/30/2015  . GOUT 05/11/2007   Qualifier: Diagnosis of  By: Sherren Mocha MD, Jory Ee   . Hereditary and idiopathic peripheral neuropathy 09/14/2014  . History of radiation therapy 02/21/13-03/31/13   rectum 50.4Gy total dose  . Hypersomnia 10/30/2015  . Hypertension   . Lung nodule 10/30/2015  . OAB (overactive bladder) 01/09/2016  . Obesity (BMI 30.0-34.9) 10/30/2015  . Obstructive sleep apnea 07/29/2010   HST 11/2015 AHI 54.  On autocpap  >> 10 cm   . Peripheral edema 09/19/2013  . rectal ca  dx'd 02/01/13   rectal. Radiation and chemotherapy- remains on chemotherapy-next tx. 08-22-13.   . S/P partial lobectomy of lung 11/20/2015  . S/P thoracotomy   . Sleep apnea    wears cpap   . SUI (stress urinary incontinence), male 01/09/2016   Past Surgical History:  Procedure Laterality Date  . A/V FISTULAGRAM Left 06/14/2018   Procedure: A/V FISTULAGRAM;  Surgeon: Waynetta Sandy, MD;  Location: [facility];  Service: Cardiovascular;  Laterality: Left;  . AV FISTULA PLACEMENT Left 03/24/2016   Procedure: CREATION OF LEFT BRACIOCEPHALIC ARTERIOVENOUS (AV) FISTULA;  Surgeon: Elam Dutch, MD;  Location: [facility];  Service: Vascular;  Laterality: Left;  . BIOPSY  09/17/2018   Procedure: BIOPSY;  Surgeon: Doran Stabler, MD;  Location: [facility];  Service: Gastroenterology;;  . BIOPSY  03/09/2019   Procedure: BIOPSY;  Surgeon: Doran Stabler, MD;  Location: [facility];  Service: Gastroenterology;;  . BIOPSY  11/22/2019   Procedure: BIOPSY;  Surgeon: Doran Stabler, MD;  Location: [facility];  Service: Gastroenterology;;  . COLON SURGERY  05/20/2013  . COLONOSCOPY  2010   [facility]  . COLONOSCOPY WITH PROPOFOL N/A 09/17/2018   Procedure: COLONOSCOPY WITH PROPOFOL;  Surgeon: [name],  MD;  Location: [facility];  Service: Gastroenterology;  Laterality: N/A;  . COLONOSCOPY WITH PROPOFOL N/A 11/22/2019   Procedure: COLONOSCOPY WITH PROPOFOL;  Surgeon: Doran Stabler, MD;  [facility]: [facility];  Service: Gastroenterology;  Laterality: N/A;  Hx of colon polyps tt   . ESOPHAGOGASTRODUODENOSCOPY (EGD) WITH PROPOFOL N/A 09/17/2018   Procedure: ESOPHAGOGASTRODUODENOSCOPY (EGD) WITH PROPOFOL;  Surgeon: Doran Stabler, MD;  [facility];  Service: Gastroenterology;  Laterality: N/A;  . ESOPHAGOGASTRODUODENOSCOPY (EGD) WITH PROPOFOL N/A 03/09/2019   Procedure: ESOPHAGOGASTRODUODENOSCOPY (EGD) WITH PROPOFOL;  Surgeon: Doran Stabler, MD;   [facility];  Service: Gastroenterology;  Laterality: N/A;  . EUS N/A 02/03/2013   Procedure: LOWER ENDOSCOPIC ULTRASOUND (EUS);  Surgeon: Milus Banister, MD;  Location: [facility];  Service: Endoscopy;  Laterality: N/A;  . HEMOSTASIS CLIP PLACEMENT  11/22/2019   Procedure: HEMOSTASIS CLIP PLACEMENT;  Surgeon: Doran Stabler, MD;  [facility];  Service: Gastroenterology;;  . Lucianne Lei CLOSURE N/A 08/24/2013   Procedure: CLOSURE OF LOOP ILEOSTOMY ;  Surgeon: Leighton Ruff, MD;  Location: [facility];  Service: General;  Laterality: N/A;  . IR ANGIO VERTEBRAL SEL SUBCLAVIAN INNOMINATE BILAT MOD SED  06/08/2018  . IR CT HEAD LTD  06/08/2018  . IR INTRAVSC STENT CERV CAROTID W/O EMB-PROT MOD SED INC ANGIO  06/08/2018  . IR PERCUTANEOUS ART THROMBECTOMY/INFUSION INTRACRANIAL INC DIAG ANGIO  06/08/2018  . LAPAROSCOPIC LOW ANTERIOR RESECTION N/A 05/20/2013   Procedure: LAPAROSCOPIC LOW ANTERIOR RESECTION WITH SPLENIC FLEXURE MOBILIZATION;  Surgeon: Leighton Ruff, MD;  Location: [facility];  Service: General;  Laterality: N/A;  . OSTOMY N/A 05/20/2013   Procedure: diverting OSTOMY;  Surgeon: Leighton Ruff, MD;  Location: [facility];  Service: General;  Laterality: N/A;  . PERIPHERAL VASCULAR INTERVENTION Left 06/14/2018   Procedure: PERIPHERAL VASCULAR INTERVENTION;  Surgeon: Waynetta Sandy, MD;  Location: [facility];  Service: Cardiovascular;  Laterality: Left;  AVF on the Left  . POLYPECTOMY    . POLYPECTOMY  11/22/2019   Procedure: POLYPECTOMY;  Surgeon: Doran Stabler, MD;  Location: [facility];  Service: Gastroenterology;;  . RADIOLOGY WITH ANESTHESIA N/A 06/08/2018   Procedure: IR WITH ANESTHESIA;  Surgeon: Radiologist, Medication, MD;  Location: [facility];  Service: Radiology;  Laterality: N/A;  . REVISON OF ARTERIOVENOUS FISTULA Left 99/83/3825   Procedure: PLICATION/REVISON OF LEFT BRACHIOCEPHALIC ARTERIOVENOUS FISTULA;  Surgeon: Elam Dutch, MD;  Location: [facility];  Service: Vascular;  Laterality: Left;  . UPPER GASTROINTESTINAL ENDOSCOPY    . VIDEO ASSISTED THORACOSCOPY (VATS)/WEDGE RESECTION Left 11/20/2015   Procedure: VIDEO ASSISTED THORACOSCOPY (VATS)/LUNG RESECTION;  Surgeon: [name], MD;  Location: [facility];  Service: Thoracic;  Laterality: Left;  Marland Kitchen VIDEO BRONCHOSCOPY N/A 11/20/2015   Procedure: VIDEO BRONCHOSCOPY;  Surgeon: [name], MD;  Location: [facility];  Service: Thoracic;  Laterality: N/A;    reports that he quit smoking about 43 years ago. His smoking use included cigarettes. He has a 5.00 pack-year smoking history. He has never used smokeless tobacco. He reports previous alcohol use. He reports that he does not use drugs. family history includes CVA in his father; Heart disease in his father; Hypertension in his father, mother, and another family member; Stroke in his father and mother. Allergies  Allergen Reactions  . Nsaids Other (See Comments)    Asked by surgeon to add this medication class as intolerance due to patients renal insufficiency.  . Lactase Diarrhea  .  Lactose Intolerance (Gi) Diarrhea  . Phentermine Other (See Comments)    Unknown reaction  . Amlodipine Other (See Comments)    Edema    Current Outpatient Medications on File Prior to Visit  Medication Sig Dispense Refill  . acetaminophen (TYLENOL) 325 MG tablet Take 650 mg by mouth every 4 (four) hours as needed for mild pain or moderate pain.     Marland Kitchen allopurinol (ZYLOPRIM) 100 MG tablet Take 1 tablet (100 mg total) by mouth daily. 90 tablet 1  . atorvastatin (LIPITOR) 20 MG tablet Take 1 tablet (20 mg total) by mouth every evening. 90 tablet 1  . BIDIL 20-37.5 MG tablet TAKE 1 TABLET BY MOUTH THREE TIMES DAILY 90 tablet 0  . cinacalcet (SENSIPAR) 60 MG tablet Take 60 mg by mouth daily.    . diphenoxylate-atropine (LOMOTIL) 2.5-0.025 MG tablet Take 1 tablet by mouth 4 (four) times daily. 120 tablet 4  . FOSRENOL 1000 MG PACK Take 1,000 mg by mouth 3  (three) times daily with meals.     . gabapentin (NEURONTIN) 100 MG capsule TAKE 1 CAPSULE BY MOUTH THREE TIMES DAILY 90 capsule 5  . heparin 1000 unit/mL SOLN injection Heparin Sodium (Porcine) 1,000 Units/mL Catheter Lock Arterial    . iron sucrose in sodium chloride 0.9 % 100 mL Iron Sucrose (Venofer)    . lipase/protease/amylase (CREON) 36000 UNITS CPEP capsule Take 2 capsules (72,000 Units total) by mouth 3 (three) times daily with meals. May also take 1 capsule (36,000 Units total) as needed (with snacks). 240 capsule 11  . Methoxy PEG-Epoetin Beta (MIRCERA IJ) Mircera    . metoprolol succinate (TOPROL-XL) 25 MG 24 hr tablet Take 1 tablet (25 mg total) by mouth daily. 90 tablet 1  . ondansetron (ZOFRAN) 4 MG tablet TAKE ONE TABLET BY MOUTH EVERY 8 HOURS AS NEEDED 45 tablet 1  . polycarbophil (FIBERCON) 625 MG tablet Take 625 mg by mouth as needed for diarrhea or loose stools.     . warfarin (COUMADIN) 5 MG tablet TAKE 1 TABLET BY MOUTH DAILY, EXCEPT 1/2 TABLET ON MONDAY AND FRIDAYS OR TAKE AS DIRECTED BY ANTICOAGULATION CLINIC (Patient taking differently: Take 2.5-5 mg by mouth See admin instructions. Take 5 mg on Sun, Tue, Wed, Thurs, and Sat. Take 2.5 mg on Mon and Fri.) 90 tablet 1   No current facility-administered medications on file prior to visit.        ROS:  All others reviewed and negative.  Objective        PE:  BP 110/68   Pulse (!) 50   Temp 98.3 F (36.8 C) (Oral)   Ht 5\' 8"  (1.727 m)   Wt 144 lb (65.3 kg)   SpO2 99%   BMI 21.90 kg/m                 Constitutional: Pt appears in NAD               HENT: Head: NCAT.                Right Ear: External ear normal.                 Left Ear: External ear normal.                Eyes: . Pupils are equal, round, and reactive to light. Conjunctivae and EOM are normal               Nose: without d/c or  deformity               Neck: Neck supple. Gross normal ROM               Cardiovascular: Normal rate and regular rhythm.                  Pulmonary/Chest: Effort normal and breath sounds without rales or wheezing.                Abd:  Soft, NT, ND, + BS, no organomegaly               Neurological: Pt is alert. At baseline orientation, motor grossly intact               Skin: right mid leg with 4 cm area red, tedner swelling without laceration or swelling               Psychiatric: Pt behavior is normal without agitation   Assessment/Plan:  Jason Cotta Sr. is a 75 y.o. Black or African American [2] male with  has a past medical history of Anemia, Arthritis, Atrial fibrillation ([place]), Chronic renal insufficiency (07/31/2011), COPD (chronic obstructive pulmonary disease) ([place]), CVA (cerebral vascular accident) ([place]) (06/2018), Deficiency anemia (07/20/2012), Diverticulosis, Edema (09/21/2010), ERECTILE DYSFUNCTION, MILD (05/11/2007), Exertional dyspnea (10/30/2015), GOUT (05/11/2007), Hereditary and idiopathic peripheral neuropathy (09/14/2014), History of radiation therapy (02/21/13-03/31/13), Hypersomnia (10/30/2015), Hypertension, Lung nodule (10/30/2015), OAB (overactive bladder) (01/09/2016), Obesity (BMI 30.0-34.9) (10/30/2015), Obstructive sleep apnea (07/29/2010), Peripheral edema (09/19/2013), rectal ca (dx'd 02/01/13), S/P partial lobectomy of lung (11/20/2015), S/P thoracotomy, Sleep apnea, and SUI (stress urinary incontinence), male (01/09/2016).   Micro: none  Cardiac tracings I have personally interpreted today:  none  Pertinent Radiological findings (summarize): none   Lab Results  Component Value Date   WBC 6.4 04/08/2019   HGB 11.6 (L) 04/30/2020   HCT 34.0 (L) 04/30/2020   PLT 179 04/08/2019   GLUCOSE 77 04/30/2020   CHOL 113 12/12/2019   TRIG 73.0 12/12/2019   HDL 54.10 12/12/2019   LDLDIRECT 120.0 06/04/2015   LDLCALC 44 12/12/2019   ALT 14 02/26/2019   AST 19 02/26/2019   NA 138 04/30/2020   K 3.6 04/30/2020   CL 98 04/30/2020   CREATININE 7.30 (H) 04/30/2020   BUN 27 (H) 04/30/2020   CO2 25  04/08/2019   TSH 1.88 12/12/2019   PSA 1.33 12/12/2019   INR 1.8 (A) 07/16/2020   HGBA1C 5.5 06/09/2018      Assessment & Plan:   Problem List Items Addressed This Visit      High   CHF (congestive heart failure) (HCC) (Chronic)    Volume stable, no other LE swelling or s/s volume overload, cont same tx - bidil, toprol       Cellulitis of right leg    Mild to mod, for antibx course,  to f/u any worsening symptoms or concerns        Medium   Essential hypertension    BP Readings from Last 3 Encounters:  08/06/20 110/68  06/18/20 105/62  06/13/20 (!) 108/56   Stable, pt to continue medical treatment  - bidil, toprol  * Current Outpatient Medications (Endocrine & Metabolic):  .  cinacalcet (SENSIPAR) 60 MG tablet, Take 60 mg by mouth daily.  Current Outpatient Medications (Cardiovascular):  .  atorvastatin (LIPITOR) 20 MG tablet, Take 1 tablet (20 mg total) by mouth every evening. Marland Kitchen  BIDIL 20-37.5 MG tablet, TAKE 1 TABLET BY MOUTH  THREE TIMES DAILY .  metoprolol succinate (TOPROL-XL) 25 MG 24 hr tablet, Take 1 tablet (25 mg total) by mouth daily.   Current Outpatient Medications (Analgesics):  .  acetaminophen (TYLENOL) 325 MG tablet, Take 650 mg by mouth every 4 (four) hours as needed for mild pain or moderate pain.  Marland Kitchen  allopurinol (ZYLOPRIM) 100 MG tablet, Take 1 tablet (100 mg total) by mouth daily.  Current Outpatient Medications (Hematological):  .  heparin 1000 unit/mL SOLN injection, Heparin Sodium (Porcine) 1,000 Units/mL Catheter Lock Arterial .  iron sucrose in sodium chloride 0.9 % 100 mL, Iron Sucrose (Venofer) .  Methoxy PEG-Epoetin Beta (MIRCERA IJ), Mircera .  warfarin (COUMADIN) 5 MG tablet, TAKE 1 TABLET BY MOUTH DAILY, EXCEPT 1/2 TABLET ON MONDAY AND FRIDAYS OR TAKE AS DIRECTED BY ANTICOAGULATION CLINIC (Patient taking differently: Take 2.5-5 mg by mouth See admin instructions. Take 5 mg on Sun, Tue, Wed, Thurs, and Sat. Take 2.5 mg on Mon and  Fri.)  Current Outpatient Medications (Other):  .  diphenoxylate-atropine (LOMOTIL) 2.5-0.025 MG tablet, Take 1 tablet by mouth 4 (four) times daily. Marland Kitchen  doxycycline (VIBRA-TABS) 100 MG tablet, Take 1 tablet (100 mg total) by mouth 2 (two) times daily. Marland Kitchen  FOSRENOL 1000 MG PACK, Take 1,000 mg by mouth 3 (three) times daily with meals.  .  gabapentin (NEURONTIN) 100 MG capsule, TAKE 1 CAPSULE BY MOUTH THREE TIMES DAILY .  lipase/protease/amylase (CREON) 36000 UNITS CPEP capsule, Take 2 capsules (72,000 Units total) by mouth 3 (three) times daily with meals. May also take 1 capsule (36,000 Units total) as needed (with snacks). .  ondansetron (ZOFRAN) 4 MG tablet, TAKE ONE TABLET BY MOUTH EVERY 8 HOURS AS NEEDED .  polycarbophil (FIBERCON) 625 MG tablet, Take 625 mg by mouth as needed for diarrhea or loose stools.          Meds ordered this encounter  Medications  . doxycycline (VIBRA-TABS) 100 MG tablet    Sig: Take 1 tablet (100 mg total) by mouth 2 (two) times daily.    Dispense:  20 tablet    Refill:  0    Follow-up: Return if symptoms worsen or fail to improve.   Cathlean Cower, MD 08/06/2020 8:58 PM [facility]

## 2020-08-07 ENCOUNTER — Telehealth: Payer: Self-pay | Admitting: Pharmacist

## 2020-08-07 DIAGNOSIS — N2581 Secondary hyperparathyroidism of renal origin: Secondary | ICD-10-CM | POA: Diagnosis not present

## 2020-08-07 DIAGNOSIS — Z283 Underimmunization status: Secondary | ICD-10-CM | POA: Diagnosis not present

## 2020-08-07 DIAGNOSIS — D509 Iron deficiency anemia, unspecified: Secondary | ICD-10-CM | POA: Diagnosis not present

## 2020-08-07 DIAGNOSIS — Z23 Encounter for immunization: Secondary | ICD-10-CM | POA: Diagnosis not present

## 2020-08-07 DIAGNOSIS — R197 Diarrhea, unspecified: Secondary | ICD-10-CM | POA: Diagnosis not present

## 2020-08-07 DIAGNOSIS — D631 Anemia in chronic kidney disease: Secondary | ICD-10-CM | POA: Diagnosis not present

## 2020-08-07 DIAGNOSIS — Z992 Dependence on renal dialysis: Secondary | ICD-10-CM | POA: Diagnosis not present

## 2020-08-07 DIAGNOSIS — R52 Pain, unspecified: Secondary | ICD-10-CM | POA: Diagnosis not present

## 2020-08-07 DIAGNOSIS — N186 End stage renal disease: Secondary | ICD-10-CM | POA: Diagnosis not present

## 2020-08-08 ENCOUNTER — Telehealth: Payer: Medicare Other

## 2020-08-08 ENCOUNTER — Telehealth: Payer: Self-pay | Admitting: Internal Medicine

## 2020-08-08 NOTE — Telephone Encounter (Signed)
Ok to continue tylenol for pain for now, but will need [name] if pain or the leg red,swelling is worsening

## 2020-08-08 NOTE — Telephone Encounter (Signed)
Forwarding to [employer]

## 2020-08-08 NOTE — Telephone Encounter (Signed)
Patient seen Dr. Jenny Reichmann on 1.31.22. He states that he is still having right leg pain. Please advise.

## 2020-08-09 ENCOUNTER — Other Ambulatory Visit: Payer: Self-pay

## 2020-08-09 DIAGNOSIS — N2581 Secondary hyperparathyroidism of renal origin: Secondary | ICD-10-CM | POA: Diagnosis not present

## 2020-08-09 DIAGNOSIS — R52 Pain, unspecified: Secondary | ICD-10-CM | POA: Diagnosis not present

## 2020-08-09 DIAGNOSIS — D631 Anemia in chronic kidney disease: Secondary | ICD-10-CM | POA: Diagnosis not present

## 2020-08-09 DIAGNOSIS — Z992 Dependence on renal dialysis: Secondary | ICD-10-CM | POA: Diagnosis not present

## 2020-08-09 DIAGNOSIS — Z23 Encounter for immunization: Secondary | ICD-10-CM | POA: Diagnosis not present

## 2020-08-09 DIAGNOSIS — R197 Diarrhea, unspecified: Secondary | ICD-10-CM | POA: Diagnosis not present

## 2020-08-09 DIAGNOSIS — Z283 Underimmunization status: Secondary | ICD-10-CM | POA: Diagnosis not present

## 2020-08-09 DIAGNOSIS — N186 End stage renal disease: Secondary | ICD-10-CM | POA: Diagnosis not present

## 2020-08-09 DIAGNOSIS — D509 Iron deficiency anemia, unspecified: Secondary | ICD-10-CM | POA: Diagnosis not present

## 2020-08-09 NOTE — Telephone Encounter (Signed)
Patient notified to cont tylenol for now if worsens make and ov

## 2020-08-10 ENCOUNTER — Encounter: Payer: Self-pay | Admitting: Internal Medicine

## 2020-08-10 ENCOUNTER — Encounter (HOSPITAL_COMMUNITY): Payer: Self-pay | Admitting: Emergency Medicine

## 2020-08-10 ENCOUNTER — Ambulatory Visit (INDEPENDENT_AMBULATORY_CARE_PROVIDER_SITE_OTHER): Payer: Medicare Other | Admitting: Internal Medicine

## 2020-08-10 ENCOUNTER — Emergency Department (HOSPITAL_COMMUNITY)
Admission: EM | Admit: 2020-08-10 | Discharge: 2020-08-10 | Disposition: A | Discharge status: Home / Self Care | Payer: Medicare Other | Attending: Emergency Medicine | Admitting: Emergency Medicine

## 2020-08-10 VITALS — BP 147/71 | HR 63 | Ht 68.0 in | Wt 137.0 lb

## 2020-08-10 DIAGNOSIS — L089 Local infection of the skin and subcutaneous tissue, unspecified: Secondary | ICD-10-CM | POA: Diagnosis not present

## 2020-08-10 DIAGNOSIS — J9601 Acute respiratory failure with hypoxia: Secondary | ICD-10-CM | POA: Diagnosis not present

## 2020-08-10 DIAGNOSIS — Z5321 Procedure and treatment not carried out due to patient leaving prior to being seen by health care provider: Secondary | ICD-10-CM | POA: Diagnosis not present

## 2020-08-10 DIAGNOSIS — Z743 Need for continuous supervision: Secondary | ICD-10-CM | POA: Diagnosis not present

## 2020-08-10 DIAGNOSIS — Z992 Dependence on renal dialysis: Secondary | ICD-10-CM | POA: Diagnosis not present

## 2020-08-10 DIAGNOSIS — L03119 Cellulitis of unspecified part of limb: Secondary | ICD-10-CM | POA: Insufficient documentation

## 2020-08-10 DIAGNOSIS — L0390 Cellulitis, unspecified: Secondary | ICD-10-CM | POA: Diagnosis not present

## 2020-08-10 DIAGNOSIS — N186 End stage renal disease: Secondary | ICD-10-CM | POA: Diagnosis not present

## 2020-08-10 DIAGNOSIS — L03115 Cellulitis of right lower limb: Secondary | ICD-10-CM | POA: Diagnosis not present

## 2020-08-10 DIAGNOSIS — R6889 Other general symptoms and signs: Secondary | ICD-10-CM | POA: Diagnosis not present

## 2020-08-10 DIAGNOSIS — R609 Edema, unspecified: Secondary | ICD-10-CM | POA: Diagnosis not present

## 2020-08-10 LAB — CBC
HCT: 34.8 % — ABNORMAL LOW (ref 39.0–52.0)
Hemoglobin: 11.7 g/dL — ABNORMAL LOW (ref 13.0–17.0)
MCH: 31 pg (ref 26.0–34.0)
MCHC: 33.6 g/dL (ref 30.0–36.0)
MCV: 92.1 fL (ref 80.0–100.0)
Platelets: 247 10*3/uL (ref 150–400)
RBC: 3.78 MIL/uL — ABNORMAL LOW (ref 4.22–5.81)
RDW: 15.4 % (ref 11.5–15.5)
WBC: 5.9 10*3/uL (ref 4.0–10.5)
nRBC: 0 % (ref 0.0–0.2)

## 2020-08-10 LAB — BASIC METABOLIC PANEL
Anion gap: 17 — ABNORMAL HIGH (ref 5–15)
BUN: 32 mg/dL — ABNORMAL HIGH (ref 8–23)
CO2: 25 mmol/L (ref 22–32)
Calcium: 9.7 mg/dL (ref 8.9–10.3)
Chloride: 96 mmol/L — ABNORMAL LOW (ref 98–111)
Creatinine, Ser: 6.84 mg/dL — ABNORMAL HIGH (ref 0.61–1.24)
GFR, Estimated: 8 mL/min — ABNORMAL LOW (ref >60)
Glucose, Bld: 77 mg/dL (ref 70–99)
Potassium: 4.2 mmol/L (ref 3.5–5.1)
Sodium: 138 mmol/L (ref 135–145)

## 2020-08-10 NOTE — Patient Instructions (Signed)
We will need to call the Ambulance for transfer to [facility] hospital ED for acute hypoxic respiratory failure, and right leg cellulitis

## 2020-08-10 NOTE — Progress Notes (Signed)
Established Patient Office Visit  Subjective:  Patient ID: Jason LAMADRID Sr., male    DOB: Jul 18, 1945  Age: 75 y.o. MRN: [id number]       Chief Complaint: follow up right leg cellulitis       HPI:  Jason KOLAR Sr. is a 75 y.o. male here with above, having been seen jan 31 with a approx 3 cm area red, tender swelling without ulcer or abscess to the mid anterior right lower leg.  Pt states good compliance with oral doxycycline, and attended HD yesterday (ESRD - for HD T-Th-Sat) but unfortunately the pain, red, swelling is now worse involving the mid leg and distal.  Denies high fever, chills, but has generalized weakness, mild sob and was found on arrival walking in with o2 sat 64%, then 83% at rest, then 92% at rest on 2L.  Denies CP.   .        Wt Readings from Last 3 Encounters:  08/10/20 137 lb (62.1 kg)  08/06/20 144 lb (65.3 kg)  06/18/20 145 lb (65.8 kg)   BP Readings from Last 3 Encounters:  08/10/20 (!) 147/71  08/06/20 110/68  06/18/20 105/62         Past Medical History:  Diagnosis Date  . Anemia   . Arthritis   . Atrial fibrillation ([place])    on Coumadin   . Chronic renal insufficiency 07/31/2011  . COPD (chronic obstructive pulmonary disease) ([place])    pt denies  . CVA (cerebral vascular accident) ([place]) 06/2018  . Deficiency anemia 07/20/2012  . Diverticulosis   . Edema 09/21/2010   Qualifier: Diagnosis of  By: Jerold Coombe    . ERECTILE DYSFUNCTION, MILD 05/11/2007   Qualifier: Diagnosis of  By: Sherren Mocha MD, Jory Ee   . Exertional dyspnea 10/30/2015  . GOUT 05/11/2007   Qualifier: Diagnosis of  By: Sherren Mocha MD, Jory Ee   . Hereditary and idiopathic peripheral neuropathy 09/14/2014  . History of radiation therapy 02/21/13-03/31/13   rectum 50.4Gy total dose  . Hypersomnia 10/30/2015  . Hypertension   . Lung nodule 10/30/2015  . OAB (overactive bladder) 01/09/2016  . Obesity (BMI 30.0-34.9) 10/30/2015  . Obstructive sleep apnea 07/29/2010   HST 11/2015 AHI 54.  On autocpap   >> 10 cm   . Peripheral edema 09/19/2013  . rectal ca dx'd 02/01/13   rectal. Radiation and chemotherapy- remains on chemotherapy-next tx. 08-22-13.   . S/P partial lobectomy of lung 11/20/2015  . S/P thoracotomy   . Sleep apnea    wears cpap   . SUI (stress urinary incontinence), male 01/09/2016   Past Surgical History:  Procedure Laterality Date  . A/V FISTULAGRAM Left 06/14/2018   Procedure: A/V FISTULAGRAM;  Surgeon: Waynetta Sandy, MD;  Location: [facility];  Service: Cardiovascular;  Laterality: Left;  . AV FISTULA PLACEMENT Left 03/24/2016   Procedure: CREATION OF LEFT BRACIOCEPHALIC ARTERIOVENOUS (AV) FISTULA;  Surgeon: Elam Dutch, MD;  Location: [facility];  Service: Vascular;  Laterality: Left;  . BIOPSY  09/17/2018   Procedure: BIOPSY;  Surgeon: Doran Stabler, MD;  Location: [facility];  Service: Gastroenterology;;  . BIOPSY  03/09/2019   Procedure: BIOPSY;  Surgeon: Doran Stabler, MD;  Location: [facility];  Service: Gastroenterology;;  . BIOPSY  11/22/2019   Procedure: BIOPSY;  Surgeon: Doran Stabler, MD;  Location: [facility];  Service: Gastroenterology;;  . COLON SURGERY  05/20/2013  . COLONOSCOPY  2010  [facility]  . COLONOSCOPY WITH PROPOFOL N/A 09/17/2018   Procedure: COLONOSCOPY WITH PROPOFOL;  Surgeon: Doran Stabler, MD;  [facility];  Service: Gastroenterology;  Laterality: N/A;  . COLONOSCOPY WITH PROPOFOL N/A 11/22/2019   Procedure: COLONOSCOPY WITH PROPOFOL;  Surgeon: Doran Stabler, MD;  [facility];  Service: Gastroenterology;  Laterality: N/A;  Hx of colon polyps tt   . ESOPHAGOGASTRODUODENOSCOPY (EGD) WITH PROPOFOL N/A 09/17/2018   Procedure: ESOPHAGOGASTRODUODENOSCOPY (EGD) WITH PROPOFOL;  Surgeon: Doran Stabler, MD;  [facility];  Service: Gastroenterology;  Laterality: N/A;  . ESOPHAGOGASTRODUODENOSCOPY (EGD) WITH PROPOFOL N/A 03/09/2019   Procedure: ESOPHAGOGASTRODUODENOSCOPY (EGD)  WITH PROPOFOL;  Surgeon: Doran Stabler, MD;  [facility];  Service: Gastroenterology;  Laterality: N/A;  . EUS N/A 02/03/2013   Procedure: LOWER ENDOSCOPIC ULTRASOUND (EUS);  Surgeon: Milus Banister, MD;  Location: [facility];  Service: Endoscopy;  Laterality: N/A;  . HEMOSTASIS CLIP PLACEMENT  11/22/2019   Procedure: HEMOSTASIS CLIP PLACEMENT;  Surgeon: Doran Stabler, MD;  [facility];  Service: Gastroenterology;;  . Lucianne Lei CLOSURE N/A 08/24/2013   Procedure: CLOSURE OF LOOP ILEOSTOMY ;  Surgeon: Leighton Ruff, MD;  [facility];  Service: General;  Laterality: N/A;  . IR ANGIO VERTEBRAL SEL SUBCLAVIAN INNOMINATE BILAT MOD SED  06/08/2018  . IR CT HEAD LTD  06/08/2018  . IR INTRAVSC STENT CERV CAROTID W/O EMB-PROT MOD SED INC ANGIO  06/08/2018  . IR PERCUTANEOUS ART THROMBECTOMY/INFUSION INTRACRANIAL INC DIAG ANGIO  06/08/2018  . LAPAROSCOPIC LOW ANTERIOR RESECTION N/A 05/20/2013   Procedure: LAPAROSCOPIC LOW ANTERIOR RESECTION WITH SPLENIC FLEXURE MOBILIZATION;  Surgeon: Leighton Ruff, MD;  [facility];  Service: General;  Laterality: N/A;  . OSTOMY N/A 05/20/2013   Procedure: diverting OSTOMY;  Surgeon: Leighton Ruff, MD;  [facility];  Service: General;  Laterality: N/A;  . PERIPHERAL VASCULAR INTERVENTION Left 06/14/2018   Procedure: PERIPHERAL VASCULAR INTERVENTION;  Surgeon: Waynetta Sandy, MD;  Location: [facility];  Service: Cardiovascular;  Laterality: Left;  AVF on the Left  . POLYPECTOMY    . POLYPECTOMY  11/22/2019   Procedure: POLYPECTOMY;  Surgeon: Doran Stabler, MD;  Location: [facility];  Service: Gastroenterology;;  . RADIOLOGY WITH ANESTHESIA N/A 06/08/2018   Procedure: IR WITH ANESTHESIA;  Surgeon: Radiologist, Medication, MD;  Location: [facility];  Service: Radiology;  Laterality: N/A;  . REVISON OF ARTERIOVENOUS FISTULA Left 60/73/7106   Procedure: PLICATION/REVISON OF LEFT BRACHIOCEPHALIC ARTERIOVENOUS  FISTULA;  Surgeon: Elam Dutch, MD;  Location: [facility];  Service: Vascular;  Laterality: Left;  . UPPER GASTROINTESTINAL ENDOSCOPY    . VIDEO ASSISTED THORACOSCOPY (VATS)/WEDGE RESECTION Left 11/20/2015   Procedure: VIDEO ASSISTED THORACOSCOPY (VATS)/LUNG RESECTION;  Surgeon: [name], MD;  Location: [facility];  Service: Thoracic;  Laterality: Left;  Marland Kitchen VIDEO BRONCHOSCOPY N/A 11/20/2015   Procedure: VIDEO BRONCHOSCOPY;  Surgeon: [name], MD;  Location: [facility];  Service: Thoracic;  Laterality: N/A;    reports that he quit smoking about 43 years ago. His smoking use included cigarettes. He has a 5.00 pack-year smoking history. He has never used smokeless tobacco. He reports previous alcohol use. He reports that he does not use drugs. family history includes CVA in his father; Heart disease in his father; Hypertension in his father, mother, and another family member; Stroke in his father and mother. Allergies  Allergen Reactions  . Nsaids Other (See Comments)  Asked by surgeon to add this medication class as intolerance due to patients renal insufficiency.  . Lactase Diarrhea  . Lactose Intolerance (Gi) Diarrhea  . Phentermine Other (See Comments)    Unknown reaction  . Amlodipine Other (See Comments)    Edema    Current Outpatient Medications on File Prior to Visit  Medication Sig Dispense Refill  . acetaminophen (TYLENOL) 325 MG tablet Take 650 mg by mouth every 4 (four) hours as needed for mild pain or moderate pain.     Marland Kitchen allopurinol (ZYLOPRIM) 100 MG tablet Take 1 tablet (100 mg total) by mouth daily. 90 tablet 1  . atorvastatin (LIPITOR) 20 MG tablet Take 1 tablet (20 mg total) by mouth every evening. 90 tablet 1  . BIDIL 20-37.5 MG tablet TAKE 1 TABLET BY MOUTH THREE TIMES DAILY 90 tablet 0  . cinacalcet (SENSIPAR) 60 MG tablet Take 60 mg by mouth daily.    . diphenoxylate-atropine (LOMOTIL) 2.5-0.025 MG tablet Take 1 tablet by mouth 4 (four) times daily. 120 tablet 4   . doxycycline (VIBRA-TABS) 100 MG tablet Take 1 tablet (100 mg total) by mouth 2 (two) times daily. 20 tablet 0  . FOSRENOL 1000 MG PACK Take 1,000 mg by mouth 3 (three) times daily with meals.     . gabapentin (NEURONTIN) 100 MG capsule TAKE 1 CAPSULE BY MOUTH THREE TIMES DAILY 90 capsule 5  . heparin 1000 unit/mL SOLN injection Heparin Sodium (Porcine) 1,000 Units/mL Catheter Lock Arterial    . iron sucrose in sodium chloride 0.9 % 100 mL Iron Sucrose (Venofer)    . lipase/protease/amylase (CREON) 36000 UNITS CPEP capsule Take 2 capsules (72,000 Units total) by mouth 3 (three) times daily with meals. May also take 1 capsule (36,000 Units total) as needed (with snacks). 240 capsule 11  . Methoxy PEG-Epoetin Beta (MIRCERA IJ) Mircera    . metoprolol succinate (TOPROL-XL) 25 MG 24 hr tablet Take 1 tablet (25 mg total) by mouth daily. 90 tablet 1  . ondansetron (ZOFRAN) 4 MG tablet TAKE ONE TABLET BY MOUTH EVERY 8 HOURS AS NEEDED 45 tablet 1  . polycarbophil (FIBERCON) 625 MG tablet Take 625 mg by mouth as needed for diarrhea or loose stools.     . warfarin (COUMADIN) 5 MG tablet TAKE 1 TABLET BY MOUTH DAILY, EXCEPT 1/2 TABLET ON MONDAY AND FRIDAYS OR TAKE AS DIRECTED BY ANTICOAGULATION CLINIC (Patient taking differently: Take 2.5-5 mg by mouth See admin instructions. Take 5 mg on Sun, Tue, Wed, Thurs, and Sat. Take 2.5 mg on Mon and Fri.) 90 tablet 1   No current facility-administered medications on file prior to visit.        ROS:  All others reviewed and negative.  Objective        PE:  BP (!) 147/71   Pulse 63   Ht 5\' 8"  (1.727 m)   Wt 137 lb (62.1 kg)   SpO2 (!) 59%   BMI 20.83 kg/m                 Constitutional: Pt appears in NAD               HENT: Head: NCAT.                Right Ear: External ear normal.                 Left Ear: External ear normal.  Eyes: . Pupils are equal, round, and reactive to light. Conjunctivae and EOM are normal               Nose:  without d/c or deformity               Neck: Neck supple. Gross normal ROM               Cardiovascular: Normal rate and regular rhythm.                 Pulmonary/Chest: Effort normal and breath sounds without rales or wheezing.                Abd:  Soft, NT, ND, + BS, no organomegaly               Neurological: Pt is alert. At baseline orientation, motor grossly intact               Skin: RLE with 2+ red, tender swelling to mid anterior leg with distal lesser sweling, no overt abscess, ulcer or drainage.                 Psychiatric: Pt behavior is normal without agitation   Assessment/Plan:  [name] is a 75 y.o. Black or African American [2] male with  has a past medical history of Anemia, Arthritis, Atrial fibrillation ([place]), Chronic renal insufficiency (07/31/2011), COPD (chronic obstructive pulmonary disease) ([place]), CVA (cerebral vascular accident) ([place]) (06/2018), Deficiency anemia (07/20/2012), Diverticulosis, Edema (09/21/2010), ERECTILE DYSFUNCTION, MILD (05/11/2007), Exertional dyspnea (10/30/2015), GOUT (05/11/2007), Hereditary and idiopathic peripheral neuropathy (09/14/2014), History of radiation therapy (02/21/13-03/31/13), Hypersomnia (10/30/2015), Hypertension, Lung nodule (10/30/2015), OAB (overactive bladder) (01/09/2016), Obesity (BMI 30.0-34.9) (10/30/2015), Obstructive sleep apnea (07/29/2010), Peripheral edema (09/19/2013), rectal ca (dx'd 02/01/13), S/P partial lobectomy of lung (11/20/2015), S/P thoracotomy, Sleep apnea, and SUI (stress urinary incontinence), male (01/09/2016).   Micro: none  Cardiac tracings I have personally interpreted today:  none  Pertinent Radiological findings (summarize): none   Lab Results  Component Value Date   WBC 6.4 04/08/2019   HGB 11.6 (L) 04/30/2020   HCT 34.0 (L) 04/30/2020   PLT 179 04/08/2019   GLUCOSE 77 04/30/2020   CHOL 113 12/12/2019   TRIG 73.0 12/12/2019   HDL 54.10 12/12/2019   LDLDIRECT 120.0 06/04/2015   LDLCALC 44 12/12/2019    ALT 14 02/26/2019   AST 19 02/26/2019   NA 138 04/30/2020   K 3.6 04/30/2020   CL 98 04/30/2020   CREATININE 7.30 (H) 04/30/2020   BUN 27 (H) 04/30/2020   CO2 25 04/08/2019   TSH 1.88 12/12/2019   PSA 1.33 12/12/2019   INR 1.8 (A) 07/16/2020   HGBA1C 5.5 06/09/2018     Assessment & Plan:   Problem List Items Addressed This Visit      High   ESRD on dialysis [facility])    Will be due for HD sat Feb 5, will likely need inpatient renal consult      Cellulitis of right leg - Primary    Now s/p 3 days oral doxy - failed outpatient course, likey to need inpatient IV antibx      Acute hypoxemic respiratory failure (HCC)    Exam benign, cant r/o PE or other, doubt volume overload by exam, will need cont o2 2L and transfer to ED for further evaluation         No orders of the defined types were placed in this encounter.   Follow-up: Return  if symptoms worsen or fail to improve.    Cathlean Cower, MD 08/10/2020 12:08 PM [facility]

## 2020-08-10 NOTE — ED Notes (Signed)
Pt's family member picked pt up. Pt will be moved OTF.

## 2020-08-10 NOTE — Assessment & Plan Note (Signed)
Exam benign, cant r/o PE or other, doubt volume overload by exam, will need cont o2 2L and transfer to ED for further evaluation

## 2020-08-10 NOTE — Assessment & Plan Note (Signed)
Now s/p 3 days oral doxy - failed outpatient course, likey to need inpatient IV antibx

## 2020-08-10 NOTE — ED Triage Notes (Addendum)
Pt transported from [facility] after finding of 02 of 59%, per EMS sat difficult to find, ear sat of 96% on RA. 99% on RA found on arrival to ED with good wave form. Pt being seen for cellulitis to lower extremity, A & O. Afibrile, denies shob

## 2020-08-10 NOTE — Assessment & Plan Note (Signed)
Will be due for HD sat Feb 5, will likely need inpatient renal consult

## 2020-08-11 DIAGNOSIS — Z283 Underimmunization status: Secondary | ICD-10-CM | POA: Diagnosis not present

## 2020-08-11 DIAGNOSIS — Z992 Dependence on renal dialysis: Secondary | ICD-10-CM | POA: Diagnosis not present

## 2020-08-11 DIAGNOSIS — R197 Diarrhea, unspecified: Secondary | ICD-10-CM | POA: Diagnosis not present

## 2020-08-11 DIAGNOSIS — N2581 Secondary hyperparathyroidism of renal origin: Secondary | ICD-10-CM | POA: Diagnosis not present

## 2020-08-11 DIAGNOSIS — R52 Pain, unspecified: Secondary | ICD-10-CM | POA: Diagnosis not present

## 2020-08-11 DIAGNOSIS — D631 Anemia in chronic kidney disease: Secondary | ICD-10-CM | POA: Diagnosis not present

## 2020-08-11 DIAGNOSIS — Z23 Encounter for immunization: Secondary | ICD-10-CM | POA: Diagnosis not present

## 2020-08-11 DIAGNOSIS — D509 Iron deficiency anemia, unspecified: Secondary | ICD-10-CM | POA: Diagnosis not present

## 2020-08-11 DIAGNOSIS — N186 End stage renal disease: Secondary | ICD-10-CM | POA: Diagnosis not present

## 2020-08-13 ENCOUNTER — Ambulatory Visit: Payer: Medicare Other | Admitting: Internal Medicine

## 2020-08-13 ENCOUNTER — Encounter: Payer: Self-pay | Admitting: Internal Medicine

## 2020-08-13 ENCOUNTER — Telehealth: Payer: Self-pay | Admitting: Internal Medicine

## 2020-08-13 ENCOUNTER — Ambulatory Visit (INDEPENDENT_AMBULATORY_CARE_PROVIDER_SITE_OTHER): Payer: Medicare Other | Admitting: Internal Medicine

## 2020-08-13 ENCOUNTER — Ambulatory Visit (INDEPENDENT_AMBULATORY_CARE_PROVIDER_SITE_OTHER): Payer: Medicare Other | Admitting: General Practice

## 2020-08-13 ENCOUNTER — Other Ambulatory Visit: Payer: Self-pay

## 2020-08-13 ENCOUNTER — Ambulatory Visit (INDEPENDENT_AMBULATORY_CARE_PROVIDER_SITE_OTHER): Payer: Medicare Other

## 2020-08-13 VITALS — BP 146/78 | HR 98 | Temp 98.7°F | Resp 16 | Ht 68.0 in | Wt 140.0 lb

## 2020-08-13 DIAGNOSIS — I480 Paroxysmal atrial fibrillation: Secondary | ICD-10-CM | POA: Diagnosis not present

## 2020-08-13 DIAGNOSIS — R600 Localized edema: Secondary | ICD-10-CM

## 2020-08-13 DIAGNOSIS — S8991XD Unspecified injury of right lower leg, subsequent encounter: Secondary | ICD-10-CM

## 2020-08-13 DIAGNOSIS — S8991XA Unspecified injury of right lower leg, initial encounter: Secondary | ICD-10-CM | POA: Diagnosis not present

## 2020-08-13 DIAGNOSIS — S82464A Nondisplaced segmental fracture of shaft of right fibula, initial encounter for closed fracture: Secondary | ICD-10-CM | POA: Insufficient documentation

## 2020-08-13 DIAGNOSIS — Z7901 Long term (current) use of anticoagulants: Secondary | ICD-10-CM

## 2020-08-13 DIAGNOSIS — M79604 Pain in right leg: Secondary | ICD-10-CM | POA: Diagnosis not present

## 2020-08-13 LAB — D-DIMER, QUANTITATIVE: D-Dimer, Quant: 0.79 mcg/mL FEU — ABNORMAL HIGH (ref <0.50)

## 2020-08-13 LAB — POCT INR: INR: 4.8 — AB (ref 2.0–3.0)

## 2020-08-13 MED ORDER — OXYCODONE HCL 5 MG PO TABS: 5.0000 mg | ORAL_TABLET | Freq: Four times a day (QID) | ORAL | 0 refills | Status: DC | PRN

## 2020-08-13 NOTE — Patient Instructions (Signed)
Tibial and Fibular Fractures  Tibial and fibular fractures are breaks in both of the lower leg bones (tibia and fibula). The tibia, also called the shin bone, is the larger of these two bones. The fibula is the smaller of the two bones and is on the outer side of the leg. When tibiofibular fractures happen, the bones may:  Break, but stay close to their normal position (stable or nondisplaced fracture).  Break and move out of their normal position (unstable or displaced fracture).  Break into several small pieces (comminuted fracture).  Break through the skin (compound or open fracture). What are the causes? This condition is caused by injuries, such as:  Falls from significant heights or falls while cycling.  Sports contact injuries, like collisions in football or soccer.  Severe, forceful twisting of your leg, such as falls while skiing.  Car or motorcycle crashes. What increases the risk? You are more likely to develop this condition if:  You participate in sports, especially contact sports or sports that may involve impact or twisting, like football or skiing.  You smoke. What are the signs or symptoms? Symptoms of this condition include:  Pain, swelling, and bruising in the lower leg, ankle, or knee.  Difficulty walking or putting weight on your injured leg.  Change in the shape of your leg at the site of the injury.  Pain that gets worse with moving or standing and gets better with rest. How is this diagnosed? This condition is diagnosed with a physical exam and X-rays. In some cases, a CT scan may be done to help diagnose certain types of fractures. How is this treated? Treatment for this condition depends on the type and severity of your fractures.  If your bones did not move out of place and your leg is still stable, or if you are unable to have surgery, you may be treated with a cast, brace, or walking boot. This keeps the bones in place (immobile) while they  heal.  If your bones are out of place or if your leg is unstable, you may need surgery. Surgery is common for tibial and fibular fractures. During surgery, the bones are moved back into their normal position, and a rod, plate, or screws are used to hold the bones in place. After surgery, the leg is put in a splint or cast. Treatment may also include:  Medicine, ice, and elevation of the leg to help relieve pain and inflammation.  Using crutches or a walker while you heal.  Exercises to strengthen leg and ankle muscles and restore motion (physical therapy). Follow these instructions at home: If you have a splint, brace, or walking boot:  Wear it as told by your health care provider. Remove it only as told by your health care provider. Some types of splints can only be removed by your health care provider.  Loosen it if your toes tingle, become numb, or turn cold and blue.  Keep it clean and dry. If you have a cast:  Do not stick anything inside the cast to scratch your skin. Doing that increases your risk of infection.  Check the skin around the cast every day. Tell your health care provider about any concerns.  You may put lotion on dry skin around the edges of the cast. Do not put lotion on the skin underneath the cast.  Keep it clean and dry. Bathing  Do not take baths, swim, or use a hot tub until your health care provider approves. Ask  your health care provider if you may take showers. You may only be allowed to take sponge baths.  If the cast, splint, brace, or boot is not waterproof: ? Do not let it get wet. ? Cover it with a watertight covering when you take a bath or a shower. Managing pain, stiffness, and swelling  If directed, put ice on the injured area. To do this: ? If you have a removable splint, brace, or boot, remove it as told by your health care provider. ? Put ice in a plastic bag. ? Place a towel between your skin and the bag or between your splint or cast  and the bag. ? Leave the ice on for 20 minutes, 2-3 times a day. ? Remove the ice if your skin turns bright red. This is very important. If you cannot feel pain, heat, or cold, you have a greater risk of damage to the area.  Move your toes often to reduce stiffness and swelling.  Raise (elevate) the injured area above the level of your heart while you are sitting or lying down.   Activity  Return to your normal activities as told by your health care provider. Ask your health care provider what activities are safe for you.  Do exercises as told by your health care provider.  Do not use the injured limb to support your body weight until your health care provider says that you can. Use crutches or a walker as told by your health care provider. Driving  Ask your health care provider when it is safe to drive if you have a cast, splint, brace, or walking boot on your leg.  Ask your health care provider if the medicine prescribed to you requires you to avoid driving or using machinery. General instructions  Do not put pressure on any part of the cast or splint until it is fully hardened. This may take several hours.  Do not use any products that contain nicotine or tobacco, such as cigarettes e-cigarettes, and chewing tobacco. These can delay bone healing. If you need help quitting, ask your health care provider.  Take over-the-counter and prescription medicines only as told by your health care provider.  Keep all follow-up visits. This is important.   Contact a health care provider if:  You have pain that gets worse or does not get better with rest or medicine.  You have more swelling or redness in your foot. Get help right away if:  Your skin or nails below your injury: ? Turn blue or gray. ? Feel cold. ? Become numb. ? Become less sensitive to touch.  You develop new or severe pain in your leg or foot.  You have tingling, burning, and tightness in your lower  leg. Summary  Tibial and fibular fractures are breaks in both of the lower leg bones (tibia and fibula).  If your bones are out of place or if your leg is unstable, you may need surgery. Surgery is common for tibial and fibular fractures.  If directed, put ice on the injured area for 20 minutes, 2-3 times a day.  Pain medicine and elevating your injured leg will help control pain and reduce swelling. Follow directions as told by your health care provider. This information is not intended to replace advice given to you by your health care provider. Make sure you discuss any questions you have with your health care provider. Document Revised: 11/09/2019 Document Reviewed: 11/09/2019 Elsevier Patient Education  [place].

## 2020-08-13 NOTE — Patient Instructions (Addendum)
Pre visit review using our clinic review tool, if applicable. No additional management support is needed unless otherwise documented below in the visit note.  Hold dosage today and tomorrow (2/7 and 2/8).  Change dosage and take 1 tablet daily.  Take 1/2 tablet until you finish the doxycycline and then resume 1 tablet daily.  Re-check in 7 to 10 days.

## 2020-08-13 NOTE — Telephone Encounter (Signed)
[facility] called about oxyCODONE (OXY IR/ROXICODONE) 5 MG immediate release tablet. They said that per the diagnosis code they can only dispense 20 tablets for a 5 day supply. Please advise.

## 2020-08-13 NOTE — Progress Notes (Addendum)
Subjective:  Patient ID: Jason Cotta Sr., male    DOB: 08/27/1945  Age: 75 y.o. MRN: [id number]  CC: Leg Injury  This visit occurred during the SARS-CoV-2 public health emergency.  Safety protocols were in place, including screening questions prior to the visit, additional usage of staff PPE, and extensive cleaning of exam room while observing appropriate contact time as indicated for disinfecting solutions.    HPI Jason DILLENBECK Sr. presents for f/up - He describes a traumatic injury to his anterior right lower extremity about 10 days ago.  He says he struck it against a table.  Since then he has had pain and swelling that interferes with his sleep and his daily activities.  He saw someone else and has been treated for cellulitis with doxycycline with no improvement.  Outpatient Medications Prior to Visit  Medication Sig Dispense Refill  . acetaminophen (TYLENOL) 325 MG tablet Take 650 mg by mouth every 4 (four) hours as needed for mild pain or moderate pain.     Marland Kitchen allopurinol (ZYLOPRIM) 100 MG tablet Take 1 tablet (100 mg total) by mouth daily. 90 tablet 1  . atorvastatin (LIPITOR) 20 MG tablet Take 1 tablet (20 mg total) by mouth every evening. 90 tablet 1  . BIDIL 20-37.5 MG tablet TAKE 1 TABLET BY MOUTH THREE TIMES DAILY 90 tablet 0  . cinacalcet (SENSIPAR) 60 MG tablet Take 60 mg by mouth daily.    . diphenoxylate-atropine (LOMOTIL) 2.5-0.025 MG tablet Take 1 tablet by mouth 4 (four) times daily. 120 tablet 4  . FOSRENOL 1000 MG PACK Take 1,000 mg by mouth 3 (three) times daily with meals.     . gabapentin (NEURONTIN) 100 MG capsule TAKE 1 CAPSULE BY MOUTH THREE TIMES DAILY 90 capsule 5  . heparin 1000 unit/mL SOLN injection Heparin Sodium (Porcine) 1,000 Units/mL Catheter Lock Arterial    . lipase/protease/amylase (CREON) 36000 UNITS CPEP capsule Take 2 capsules (72,000 Units total) by mouth 3 (three) times daily with meals. May also take 1 capsule (36,000 Units total) as needed (with  snacks). 240 capsule 11  . Methoxy PEG-Epoetin Beta (MIRCERA IJ) Mircera    . metoprolol succinate (TOPROL-XL) 25 MG 24 hr tablet Take 1 tablet (25 mg total) by mouth daily. 90 tablet 1  . ondansetron (ZOFRAN) 4 MG tablet TAKE ONE TABLET BY MOUTH EVERY 8 HOURS AS NEEDED 45 tablet 1  . polycarbophil (FIBERCON) 625 MG tablet Take 625 mg by mouth as needed for diarrhea or loose stools.     . warfarin (COUMADIN) 5 MG tablet TAKE 1 TABLET BY MOUTH DAILY, EXCEPT 1/2 TABLET ON MONDAY AND FRIDAYS OR TAKE AS DIRECTED BY ANTICOAGULATION CLINIC (Patient taking differently: Take 2.5-5 mg by mouth See admin instructions. Take 5 mg on Sun, Tue, Wed, Thurs, and Sat. Take 2.5 mg on Mon and Fri.) 90 tablet 1  . doxycycline (VIBRA-TABS) 100 MG tablet Take 1 tablet (100 mg total) by mouth 2 (two) times daily. 20 tablet 0  . iron sucrose in sodium chloride 0.9 % 100 mL Iron Sucrose (Venofer)     No facility-administered medications prior to visit.    ROS Review of Systems  Constitutional: Negative for chills, diaphoresis, fatigue and fever.  HENT: Negative.   Eyes: Negative for visual disturbance.  Respiratory: Negative for cough, chest tightness and shortness of breath.   Cardiovascular: Negative for chest pain, palpitations and leg swelling.  Gastrointestinal: Negative for abdominal pain.  Endocrine: Negative.   Genitourinary: Negative.  Negative for difficulty urinating.  Musculoskeletal: Positive for arthralgias and joint swelling.  Skin: Negative.  Negative for color change and pallor.  Neurological: Negative.  Negative for dizziness and weakness.  Hematological: Negative for adenopathy. Does not bruise/bleed easily.  Psychiatric/Behavioral: Negative.     Objective:  BP (!) 146/78   Pulse 98   Temp 98.7 F (37.1 C) (Oral)   Resp 16   Ht 5\' 8"  (1.727 m)   Wt 140 lb (63.5 kg)   SpO2 99%   BMI 21.29 kg/m   BP Readings from Last 3 Encounters:  08/13/20 (!) 146/78  08/10/20 (!) 163/83   08/10/20 (!) 147/71    Wt Readings from Last 3 Encounters:  08/13/20 140 lb (63.5 kg)  08/10/20 136 lb 11 oz (62 kg)  08/10/20 137 lb (62.1 kg)    Physical Exam Vitals reviewed.  HENT:     Nose: Nose normal.  Eyes:     General: No scleral icterus.    Conjunctiva/sclera: Conjunctivae normal.  Cardiovascular:     Rate and Rhythm: Normal rate and regular rhythm.     Pulses:          Dorsalis pedis pulses are 1+ on the right side and 1+ on the left side.       Posterior tibial pulses are 1+ on the right side and 1+ on the left side.     Heart sounds: No murmur heard.   Pulmonary:     Effort: Pulmonary effort is normal.     Breath sounds: No wheezing or rales.  Abdominal:     General: Abdomen is flat.     Tenderness: There is no abdominal tenderness.  Musculoskeletal:        General: Swelling and tenderness present. Normal range of motion.     Cervical back: Neck supple.     Right lower leg: Edema present.     Left lower leg: No edema.       Legs:  Skin:    General: Skin is warm and dry.  Neurological:     General: No focal deficit present.     Mental Status: He is oriented to person, place, and time. Mental status is at baseline.     Lab Results  Component Value Date   WBC 5.9 08/10/2020   HGB 11.7 (L) 08/10/2020   HCT 34.8 (L) 08/10/2020   PLT 247 08/10/2020   GLUCOSE 77 08/10/2020   CHOL 113 12/12/2019   TRIG 73.0 12/12/2019   HDL 54.10 12/12/2019   LDLDIRECT 120.0 06/04/2015   LDLCALC 44 12/12/2019   ALT 14 02/26/2019   AST 19 02/26/2019   NA 138 08/10/2020   K 4.2 08/10/2020   CL 96 (L) 08/10/2020   CREATININE 6.84 (H) 08/10/2020   BUN 32 (H) 08/10/2020   CO2 25 08/10/2020   TSH 1.88 12/12/2019   PSA 1.33 12/12/2019   INR 4.8 (A) 08/13/2020   HGBA1C 5.5 06/09/2018   DG Tibia/Fibula Right  Result Date: 08/13/2020 CLINICAL DATA:  75 year old male with injury to the right lower leg approximately 10 days ago. EXAM: RIGHT TIBIA AND FIBULA - 2 VIEW  COMPARISON:  None. FINDINGS: Focal, expansile periosteal reaction about the mid fibular diaphysis without definite cortical interruption or visible fracture. The tibia is intact throughout. No malalignment. The visualized soft tissues are within normal limits. IMPRESSION: Findings favor healing nondisplaced fracture of the mid fibula in the setting of reported trauma. Consider short-term follow-up radiograph (2-3 weeks) to assess for  resolution given oncologic history. Electronically Signed   By: Ruthann Cancer MD   On: 08/13/2020 10:00    Assessment & Plan:   Vito was seen today for leg injury.  Diagnoses and all orders for this visit:  Leg edema, right- D-dimer is normal for his age.  I am not concerned about deep venous thrombosis. -     D-dimer, quantitative (not at [facility]); Future -     D-dimer, quantitative (not at [facility])  Injury of right lower extremity, subsequent encounter- Based on the history, symptoms, exam, and x-ray he has a fibular shaft fracture.  I have asked him to see orthopedics as soon as possible. -     Cancel: DG Tibia/Fibula Right; Future -     DG Tibia/Fibula Right; Future  Closed nondisplaced segmental fracture of shaft of right fibula, initial encounter -     Ambulatory referral to Orthopedic Surgery -     oxyCODONE (OXY IR/ROXICODONE) 5 MG immediate release tablet; Take 1 tablet (5 mg total) by mouth every 6 (six) hours as needed for moderate pain.   I have discontinued Jason Cotta Sr.'s iron sucrose in sodium chloride 0.9 % 100 mL and doxycycline. I am also having him start on oxyCODONE. Additionally, I am having him maintain his acetaminophen, cinacalcet, polycarbophil, lipase/protease/amylase, Fosrenol, Methoxy PEG-Epoetin Beta (MIRCERA IJ), warfarin, allopurinol, metoprolol succinate, heparin, atorvastatin, gabapentin, diphenoxylate-atropine, BiDil, and ondansetron.  Meds ordered this encounter  Medications  . oxyCODONE (OXY IR/ROXICODONE) 5 MG immediate  release tablet    Sig: Take 1 tablet (5 mg total) by mouth every 6 (six) hours as needed for moderate pain.    Dispense:  30 tablet    Refill:  0     Follow-up: Return if symptoms worsen or fail to improve.  [name], MD

## 2020-08-14 ENCOUNTER — Ambulatory Visit: Payer: Medicare Other | Admitting: Family Medicine

## 2020-08-14 DIAGNOSIS — Z992 Dependence on renal dialysis: Secondary | ICD-10-CM | POA: Diagnosis not present

## 2020-08-14 DIAGNOSIS — Z283 Underimmunization status: Secondary | ICD-10-CM | POA: Diagnosis not present

## 2020-08-14 DIAGNOSIS — D631 Anemia in chronic kidney disease: Secondary | ICD-10-CM | POA: Diagnosis not present

## 2020-08-14 DIAGNOSIS — N2581 Secondary hyperparathyroidism of renal origin: Secondary | ICD-10-CM | POA: Diagnosis not present

## 2020-08-14 DIAGNOSIS — Z23 Encounter for immunization: Secondary | ICD-10-CM | POA: Diagnosis not present

## 2020-08-14 DIAGNOSIS — D509 Iron deficiency anemia, unspecified: Secondary | ICD-10-CM | POA: Diagnosis not present

## 2020-08-14 DIAGNOSIS — N186 End stage renal disease: Secondary | ICD-10-CM | POA: Diagnosis not present

## 2020-08-14 DIAGNOSIS — R197 Diarrhea, unspecified: Secondary | ICD-10-CM | POA: Diagnosis not present

## 2020-08-14 DIAGNOSIS — R52 Pain, unspecified: Secondary | ICD-10-CM | POA: Diagnosis not present

## 2020-08-15 ENCOUNTER — Emergency Department (HOSPITAL_COMMUNITY): Payer: Medicare Other

## 2020-08-15 ENCOUNTER — Inpatient Hospital Stay (HOSPITAL_COMMUNITY)
Admission: EM | Admit: 2020-08-15 | Discharge: 2020-08-19 | DRG: 377 | Disposition: A | Discharge status: Home Health | Payer: Medicare Other | Attending: Internal Medicine | Admitting: Internal Medicine

## 2020-08-15 ENCOUNTER — Encounter (HOSPITAL_COMMUNITY)
Admission: EM | Disposition: A | Discharge status: Home Health | Payer: Self-pay | Source: Home / Self Care | Attending: Internal Medicine

## 2020-08-15 ENCOUNTER — Inpatient Hospital Stay (HOSPITAL_COMMUNITY): Payer: Medicare Other | Admitting: Anesthesiology

## 2020-08-15 ENCOUNTER — Encounter (HOSPITAL_COMMUNITY): Payer: Self-pay

## 2020-08-15 ENCOUNTER — Telehealth: Payer: Self-pay | Admitting: Gastroenterology

## 2020-08-15 ENCOUNTER — Other Ambulatory Visit: Payer: Self-pay

## 2020-08-15 DIAGNOSIS — M109 Gout, unspecified: Secondary | ICD-10-CM | POA: Diagnosis not present

## 2020-08-15 DIAGNOSIS — K922 Gastrointestinal hemorrhage, unspecified: Secondary | ICD-10-CM | POA: Diagnosis not present

## 2020-08-15 DIAGNOSIS — R6510 Systemic inflammatory response syndrome (SIRS) of non-infectious origin without acute organ dysfunction: Secondary | ICD-10-CM | POA: Diagnosis present

## 2020-08-15 DIAGNOSIS — Z8673 Personal history of transient ischemic attack (TIA), and cerebral infarction without residual deficits: Secondary | ICD-10-CM

## 2020-08-15 DIAGNOSIS — Y92009 Unspecified place in unspecified non-institutional (private) residence as the place of occurrence of the external cause: Secondary | ICD-10-CM | POA: Diagnosis not present

## 2020-08-15 DIAGNOSIS — I132 Hypertensive heart and chronic kidney disease with heart failure and with stage 5 chronic kidney disease, or end stage renal disease: Secondary | ICD-10-CM | POA: Diagnosis not present

## 2020-08-15 DIAGNOSIS — L03115 Cellulitis of right lower limb: Secondary | ICD-10-CM | POA: Diagnosis not present

## 2020-08-15 DIAGNOSIS — Z902 Acquired absence of lung [part of]: Secondary | ICD-10-CM

## 2020-08-15 DIAGNOSIS — Z7901 Long term (current) use of anticoagulants: Secondary | ICD-10-CM | POA: Diagnosis not present

## 2020-08-15 DIAGNOSIS — I5032 Chronic diastolic (congestive) heart failure: Secondary | ICD-10-CM | POA: Diagnosis not present

## 2020-08-15 DIAGNOSIS — K250 Acute gastric ulcer with hemorrhage: Principal | ICD-10-CM | POA: Diagnosis present

## 2020-08-15 DIAGNOSIS — Z992 Dependence on renal dialysis: Secondary | ICD-10-CM

## 2020-08-15 DIAGNOSIS — N186 End stage renal disease: Secondary | ICD-10-CM | POA: Diagnosis present

## 2020-08-15 DIAGNOSIS — N250 Renal osteodystrophy: Secondary | ICD-10-CM | POA: Diagnosis not present

## 2020-08-15 DIAGNOSIS — R9431 Abnormal electrocardiogram [ECG] [EKG]: Secondary | ICD-10-CM | POA: Diagnosis not present

## 2020-08-15 DIAGNOSIS — D631 Anemia in chronic kidney disease: Secondary | ICD-10-CM | POA: Diagnosis not present

## 2020-08-15 DIAGNOSIS — I5042 Chronic combined systolic (congestive) and diastolic (congestive) heart failure: Secondary | ICD-10-CM | POA: Diagnosis not present

## 2020-08-15 DIAGNOSIS — I5022 Chronic systolic (congestive) heart failure: Secondary | ICD-10-CM | POA: Diagnosis not present

## 2020-08-15 DIAGNOSIS — D62 Acute posthemorrhagic anemia: Secondary | ICD-10-CM | POA: Diagnosis not present

## 2020-08-15 DIAGNOSIS — W2209XA Striking against other stationary object, initial encounter: Secondary | ICD-10-CM | POA: Diagnosis present

## 2020-08-15 DIAGNOSIS — K529 Noninfective gastroenteritis and colitis, unspecified: Secondary | ICD-10-CM | POA: Diagnosis present

## 2020-08-15 DIAGNOSIS — Z87891 Personal history of nicotine dependence: Secondary | ICD-10-CM

## 2020-08-15 DIAGNOSIS — I10 Essential (primary) hypertension: Secondary | ICD-10-CM | POA: Diagnosis not present

## 2020-08-15 DIAGNOSIS — Z20822 Contact with and (suspected) exposure to covid-19: Secondary | ICD-10-CM | POA: Diagnosis not present

## 2020-08-15 DIAGNOSIS — K449 Diaphragmatic hernia without obstruction or gangrene: Secondary | ICD-10-CM | POA: Diagnosis present

## 2020-08-15 DIAGNOSIS — K254 Chronic or unspecified gastric ulcer with hemorrhage: Secondary | ICD-10-CM | POA: Diagnosis not present

## 2020-08-15 DIAGNOSIS — G4733 Obstructive sleep apnea (adult) (pediatric): Secondary | ICD-10-CM | POA: Diagnosis present

## 2020-08-15 DIAGNOSIS — J449 Chronic obstructive pulmonary disease, unspecified: Secondary | ICD-10-CM | POA: Diagnosis present

## 2020-08-15 DIAGNOSIS — R791 Abnormal coagulation profile: Secondary | ICD-10-CM | POA: Diagnosis present

## 2020-08-15 DIAGNOSIS — K921 Melena: Secondary | ICD-10-CM

## 2020-08-15 DIAGNOSIS — E872 Acidosis: Secondary | ICD-10-CM | POA: Diagnosis not present

## 2020-08-15 DIAGNOSIS — R578 Other shock: Secondary | ICD-10-CM | POA: Diagnosis not present

## 2020-08-15 DIAGNOSIS — Z23 Encounter for immunization: Secondary | ICD-10-CM | POA: Diagnosis not present

## 2020-08-15 DIAGNOSIS — Z923 Personal history of irradiation: Secondary | ICD-10-CM | POA: Diagnosis not present

## 2020-08-15 DIAGNOSIS — N3281 Overactive bladder: Secondary | ICD-10-CM | POA: Diagnosis not present

## 2020-08-15 DIAGNOSIS — D509 Iron deficiency anemia, unspecified: Secondary | ICD-10-CM | POA: Diagnosis not present

## 2020-08-15 DIAGNOSIS — I480 Paroxysmal atrial fibrillation: Secondary | ICD-10-CM | POA: Diagnosis not present

## 2020-08-15 DIAGNOSIS — R197 Diarrhea, unspecified: Secondary | ICD-10-CM | POA: Diagnosis not present

## 2020-08-15 DIAGNOSIS — Z283 Underimmunization status: Secondary | ICD-10-CM | POA: Diagnosis not present

## 2020-08-15 DIAGNOSIS — R52 Pain, unspecified: Secondary | ICD-10-CM | POA: Diagnosis not present

## 2020-08-15 DIAGNOSIS — Z85048 Personal history of other malignant neoplasm of rectum, rectosigmoid junction, and anus: Secondary | ICD-10-CM | POA: Diagnosis not present

## 2020-08-15 DIAGNOSIS — R0602 Shortness of breath: Secondary | ICD-10-CM | POA: Diagnosis not present

## 2020-08-15 DIAGNOSIS — S82491A Other fracture of shaft of right fibula, initial encounter for closed fracture: Secondary | ICD-10-CM | POA: Diagnosis present

## 2020-08-15 DIAGNOSIS — I120 Hypertensive chronic kidney disease with stage 5 chronic kidney disease or end stage renal disease: Secondary | ICD-10-CM | POA: Diagnosis not present

## 2020-08-15 DIAGNOSIS — N2581 Secondary hyperparathyroidism of renal origin: Secondary | ICD-10-CM | POA: Diagnosis not present

## 2020-08-15 DIAGNOSIS — Z9221 Personal history of antineoplastic chemotherapy: Secondary | ICD-10-CM | POA: Diagnosis not present

## 2020-08-15 DIAGNOSIS — D689 Coagulation defect, unspecified: Secondary | ICD-10-CM

## 2020-08-15 DIAGNOSIS — R571 Hypovolemic shock: Secondary | ICD-10-CM

## 2020-08-15 DIAGNOSIS — S82409A Unspecified fracture of shaft of unspecified fibula, initial encounter for closed fracture: Secondary | ICD-10-CM | POA: Diagnosis present

## 2020-08-15 DIAGNOSIS — R600 Localized edema: Secondary | ICD-10-CM | POA: Diagnosis present

## 2020-08-15 DIAGNOSIS — I959 Hypotension, unspecified: Secondary | ICD-10-CM | POA: Diagnosis not present

## 2020-08-15 HISTORY — PX: ESOPHAGOGASTRODUODENOSCOPY (EGD) WITH PROPOFOL: SHX5813

## 2020-08-15 HISTORY — PX: HEMOSTASIS CONTROL: SHX6838

## 2020-08-15 HISTORY — PX: HEMOSTASIS CLIP PLACEMENT: SHX6857

## 2020-08-15 LAB — LACTIC ACID, PLASMA
Lactic Acid, Venous: 4.1 mmol/L (ref 0.5–1.9)
Lactic Acid, Venous: 2.8 mmol/L (ref 0.5–1.9)
Lactic Acid, Venous: 3 mmol/L (ref 0.5–1.9)
Lactic Acid, Venous: 1.1 mmol/L (ref 0.5–1.9)

## 2020-08-15 LAB — CBC WITH DIFFERENTIAL/PLATELET
Abs Immature Granulocytes: 0.18 10*3/uL — ABNORMAL HIGH (ref 0.00–0.07)
Abs Immature Granulocytes: 0.19 10*3/uL — ABNORMAL HIGH (ref 0.00–0.07)
Basophils Absolute: 0 10*3/uL (ref 0.0–0.1)
Basophils Absolute: 0 10*3/uL (ref 0.0–0.1)
Basophils Relative: 0 %
Basophils Relative: 0 %
Eosinophils Absolute: 0 10*3/uL (ref 0.0–0.5)
Eosinophils Absolute: 0 10*3/uL (ref 0.0–0.5)
Eosinophils Relative: 0 %
Eosinophils Relative: 0 %
HCT: 21.3 % — ABNORMAL LOW (ref 39.0–52.0)
HCT: 22.1 % — ABNORMAL LOW (ref 39.0–52.0)
Hemoglobin: 7 g/dL — ABNORMAL LOW (ref 13.0–17.0)
Hemoglobin: 7.1 g/dL — ABNORMAL LOW (ref 13.0–17.0)
Immature Granulocytes: 1 %
Immature Granulocytes: 1 %
Lymphocytes Relative: 5 %
Lymphocytes Relative: 3 %
Lymphs Abs: 1.2 10*3/uL (ref 0.7–4.0)
Lymphs Abs: 0.8 10*3/uL (ref 0.7–4.0)
MCH: 30.7 pg (ref 26.0–34.0)
MCH: 30.1 pg (ref 26.0–34.0)
MCHC: 32.9 g/dL (ref 30.0–36.0)
MCHC: 32.1 g/dL (ref 30.0–36.0)
MCV: 93.4 fL (ref 80.0–100.0)
MCV: 93.6 fL (ref 80.0–100.0)
Monocytes Absolute: 1.7 10*3/uL — ABNORMAL HIGH (ref 0.1–1.0)
Monocytes Absolute: 1.8 10*3/uL — ABNORMAL HIGH (ref 0.1–1.0)
Monocytes Relative: 7 %
Monocytes Relative: 7 %
Neutro Abs: 21.9 10*3/uL — ABNORMAL HIGH (ref 1.7–7.7)
Neutro Abs: 23.4 10*3/uL — ABNORMAL HIGH (ref 1.7–7.7)
Neutrophils Relative %: 87 %
Neutrophils Relative %: 89 %
Platelets: 308 10*3/uL (ref 150–400)
Platelets: 258 10*3/uL (ref 150–400)
RBC: 2.28 MIL/uL — ABNORMAL LOW (ref 4.22–5.81)
RBC: 2.36 MIL/uL — ABNORMAL LOW (ref 4.22–5.81)
RDW: 15.9 % — ABNORMAL HIGH (ref 11.5–15.5)
RDW: 15.9 % — ABNORMAL HIGH (ref 11.5–15.5)
WBC: 25 10*3/uL — ABNORMAL HIGH (ref 4.0–10.5)
WBC: 26.2 10*3/uL — ABNORMAL HIGH (ref 4.0–10.5)
nRBC: 0 % (ref 0.0–0.2)
nRBC: 0 % (ref 0.0–0.2)

## 2020-08-15 LAB — PREPARE RBC (CROSSMATCH)

## 2020-08-15 LAB — COMPREHENSIVE METABOLIC PANEL
ALT: 11 U/L (ref 0–44)
ALT: 11 U/L (ref 0–44)
AST: 25 U/L (ref 15–41)
AST: 28 U/L (ref 15–41)
Albumin: 2.9 g/dL — ABNORMAL LOW (ref 3.5–5.0)
Albumin: 3.1 g/dL — ABNORMAL LOW (ref 3.5–5.0)
Alkaline Phosphatase: 88 U/L (ref 38–126)
Alkaline Phosphatase: 88 U/L (ref 38–126)
Anion gap: 26 — ABNORMAL HIGH (ref 5–15)
Anion gap: 26 — ABNORMAL HIGH (ref 5–15)
BUN: 72 mg/dL — ABNORMAL HIGH (ref 8–23)
BUN: 73 mg/dL — ABNORMAL HIGH (ref 8–23)
CO2: 23 mmol/L (ref 22–32)
CO2: 24 mmol/L (ref 22–32)
Calcium: 9.4 mg/dL (ref 8.9–10.3)
Calcium: 9.5 mg/dL (ref 8.9–10.3)
Chloride: 92 mmol/L — ABNORMAL LOW (ref 98–111)
Chloride: 94 mmol/L — ABNORMAL LOW (ref 98–111)
Creatinine, Ser: 6.83 mg/dL — ABNORMAL HIGH (ref 0.61–1.24)
Creatinine, Ser: 6.83 mg/dL — ABNORMAL HIGH (ref 0.61–1.24)
GFR, Estimated: 8 mL/min — ABNORMAL LOW (ref >60)
GFR, Estimated: 8 mL/min — ABNORMAL LOW (ref >60)
Glucose, Bld: 91 mg/dL (ref 70–99)
Glucose, Bld: 99 mg/dL (ref 70–99)
Potassium: 4.3 mmol/L (ref 3.5–5.1)
Potassium: 4.7 mmol/L (ref 3.5–5.1)
Sodium: 142 mmol/L (ref 135–145)
Sodium: 143 mmol/L (ref 135–145)
Total Bilirubin: 1.2 mg/dL (ref 0.3–1.2)
Total Bilirubin: 1.4 mg/dL — ABNORMAL HIGH (ref 0.3–1.2)
Total Protein: 6.2 g/dL — ABNORMAL LOW (ref 6.5–8.1)
Total Protein: 6.3 g/dL — ABNORMAL LOW (ref 6.5–8.1)

## 2020-08-15 LAB — TROPONIN I (HIGH SENSITIVITY)
Troponin I (High Sensitivity): 33 ng/L — ABNORMAL HIGH (ref <18)
Troponin I (High Sensitivity): 34 ng/L — ABNORMAL HIGH (ref <18)

## 2020-08-15 LAB — RESP PANEL BY RT-PCR (FLU A&B, COVID) ARPGX2
Influenza A by PCR: NEGATIVE
Influenza B by PCR: NEGATIVE
SARS Coronavirus 2 by RT PCR: NEGATIVE

## 2020-08-15 LAB — PROTIME-INR
INR: 8.3 (ref 0.8–1.2)
INR: 2.1 — ABNORMAL HIGH (ref 0.8–1.2)
Prothrombin Time: 66.9 seconds — ABNORMAL HIGH (ref 11.4–15.2)
Prothrombin Time: 22.9 seconds — ABNORMAL HIGH (ref 11.4–15.2)

## 2020-08-15 LAB — HEMOGLOBIN AND HEMATOCRIT, BLOOD
HCT: 21 % — ABNORMAL LOW (ref 39.0–52.0)
Hemoglobin: 7.2 g/dL — ABNORMAL LOW (ref 13.0–17.0)

## 2020-08-15 LAB — APTT: aPTT: 91 seconds — ABNORMAL HIGH (ref 24–36)

## 2020-08-15 LAB — LIPASE, BLOOD: Lipase: 47 U/L (ref 11–51)

## 2020-08-15 LAB — POC OCCULT BLOOD, ED: Fecal Occult Bld: POSITIVE — AB

## 2020-08-15 SURGERY — ESOPHAGOGASTRODUODENOSCOPY (EGD) WITH PROPOFOL
Anesthesia: Monitor Anesthesia Care

## 2020-08-15 MED ORDER — SODIUM CHLORIDE 0.9 % IV SOLN
1.0000 g | INTRAVENOUS | Status: DC
  Administered 2020-08-16: 1 g via INTRAVENOUS
  Filled 2020-08-15 (×2): qty 1

## 2020-08-15 MED ORDER — PROPOFOL 500 MG/50ML IV EMUL
INTRAVENOUS | Status: DC | PRN
  Administered 2020-08-15: 100 ug/kg/min via INTRAVENOUS

## 2020-08-15 MED ORDER — ACETAMINOPHEN 650 MG RE SUPP: 650.0000 mg | Freq: Four times a day (QID) | RECTAL | Status: DC | PRN

## 2020-08-15 MED ORDER — SODIUM CHLORIDE 0.9 % IV SOLN
8.0000 mg/h | INTRAVENOUS | Status: AC
  Administered 2020-08-15 – 2020-08-18 (×6): 8 mg/h via INTRAVENOUS
  Filled 2020-08-15 (×7): qty 80

## 2020-08-15 MED ORDER — LACTATED RINGERS IV BOLUS (SEPSIS)
1000.0000 mL | Freq: Once | INTRAVENOUS | Status: AC
  Administered 2020-08-15: 1000 mL via INTRAVENOUS

## 2020-08-15 MED ORDER — ONDANSETRON HCL 4 MG/2ML IJ SOLN: 4.0000 mg | Freq: Four times a day (QID) | INTRAMUSCULAR | Status: DC | PRN

## 2020-08-15 MED ORDER — SODIUM CHLORIDE 0.9 % IV SOLN
2.0000 g | Freq: Once | INTRAVENOUS | Status: AC
  Administered 2020-08-15: 2 g via INTRAVENOUS
  Filled 2020-08-15: qty 2

## 2020-08-15 MED ORDER — METRONIDAZOLE IN NACL 5-0.79 MG/ML-% IV SOLN
500.0000 mg | Freq: Once | INTRAVENOUS | Status: AC
  Administered 2020-08-15: 500 mg via INTRAVENOUS
  Filled 2020-08-15: qty 100

## 2020-08-15 MED ORDER — PHENYLEPHRINE HCL-NACL 10-0.9 MG/250ML-% IV SOLN
INTRAVENOUS | Status: DC | PRN
  Administered 2020-08-15: 50 ug/min via INTRAVENOUS

## 2020-08-15 MED ORDER — CINACALCET HCL 30 MG PO TABS
60.0000 mg | ORAL_TABLET | Freq: Every day | ORAL | Status: DC
  Administered 2020-08-16 – 2020-08-18 (×3): 60 mg via ORAL
  Filled 2020-08-15 (×4): qty 2

## 2020-08-15 MED ORDER — LACTATED RINGERS IV SOLN: INTRAVENOUS | Status: DC | PRN

## 2020-08-15 MED ORDER — EPINEPHRINE 1 MG/10ML IJ SOSY
PREFILLED_SYRINGE | INTRAMUSCULAR | Status: AC
  Filled 2020-08-15: qty 10

## 2020-08-15 MED ORDER — LACTATED RINGERS IV SOLN: INTRAVENOUS | Status: DC

## 2020-08-15 MED ORDER — ACETAMINOPHEN 325 MG PO TABS
650.0000 mg | ORAL_TABLET | Freq: Four times a day (QID) | ORAL | Status: DC | PRN
  Administered 2020-08-18 – 2020-08-19 (×3): 650 mg via ORAL
  Filled 2020-08-15 (×3): qty 2

## 2020-08-15 MED ORDER — PANTOPRAZOLE SODIUM 40 MG IV SOLR
40.0000 mg | Freq: Once | INTRAVENOUS | Status: AC
  Administered 2020-08-15: 40 mg via INTRAVENOUS
  Filled 2020-08-15: qty 40

## 2020-08-15 MED ORDER — METRONIDAZOLE IN NACL 5-0.79 MG/ML-% IV SOLN
500.0000 mg | Freq: Three times a day (TID) | INTRAVENOUS | Status: DC
  Administered 2020-08-15 – 2020-08-17 (×5): 500 mg via INTRAVENOUS
  Filled 2020-08-15 (×5): qty 100

## 2020-08-15 MED ORDER — SODIUM CHLORIDE 0.9 % IV SOLN: 1.0000 g | INTRAVENOUS | Status: DC

## 2020-08-15 MED ORDER — PROPOFOL 10 MG/ML IV BOLUS
INTRAVENOUS | Status: DC | PRN
  Administered 2020-08-15: 20 mg via INTRAVENOUS

## 2020-08-15 MED ORDER — SODIUM CHLORIDE (PF) 0.9 % IJ SOLN
PREFILLED_SYRINGE | INTRAMUSCULAR | Status: DC | PRN
  Administered 2020-08-15: 5.5 mL

## 2020-08-15 MED ORDER — VITAMIN K1 10 MG/ML IJ SOLN
10.0000 mg | Freq: Once | INTRAVENOUS | Status: AC
  Administered 2020-08-15: 10 mg via INTRAVENOUS
  Filled 2020-08-15: qty 1

## 2020-08-15 MED ORDER — SODIUM CHLORIDE 0.9 % IV SOLN
10.0000 mL/h | Freq: Once | INTRAVENOUS | Status: AC
  Administered 2020-08-15: 10 mL/h via INTRAVENOUS

## 2020-08-15 MED ORDER — SODIUM CHLORIDE 0.9% IV SOLUTION: Freq: Once | INTRAVENOUS | Status: DC

## 2020-08-15 MED ORDER — SODIUM CHLORIDE 0.9% FLUSH
3.0000 mL | Freq: Two times a day (BID) | INTRAVENOUS | Status: DC
  Administered 2020-08-15 – 2020-08-19 (×7): 3 mL via INTRAVENOUS

## 2020-08-15 MED ORDER — PHENYLEPHRINE 40 MCG/ML (10ML) SYRINGE FOR IV PUSH (FOR BLOOD PRESSURE SUPPORT)
PREFILLED_SYRINGE | INTRAVENOUS | Status: DC | PRN
  Administered 2020-08-15 (×2): 120 ug via INTRAVENOUS

## 2020-08-15 MED ORDER — CHLORHEXIDINE GLUCONATE CLOTH 2 % EX PADS
6.0000 | MEDICATED_PAD | Freq: Every day | CUTANEOUS | Status: DC
  Administered 2020-08-16 – 2020-08-19 (×3): 6 via TOPICAL

## 2020-08-15 MED ORDER — PANTOPRAZOLE SODIUM 40 MG IV SOLR
40.0000 mg | Freq: Two times a day (BID) | INTRAVENOUS | Status: DC
  Administered 2020-08-18: 40 mg via INTRAVENOUS
  Filled 2020-08-15: qty 40

## 2020-08-15 MED ORDER — DOXERCALCIFEROL 4 MCG/2ML IV SOLN
7.0000 ug | INTRAVENOUS | Status: DC
  Administered 2020-08-16 – 2020-08-18 (×2): 7 ug via INTRAVENOUS
  Filled 2020-08-15: qty 4

## 2020-08-15 MED ORDER — ONDANSETRON HCL 4 MG PO TABS: 4.0000 mg | ORAL_TABLET | Freq: Four times a day (QID) | ORAL | Status: DC | PRN

## 2020-08-15 SURGICAL SUPPLY — 15 items

## 2020-08-15 NOTE — Consult Note (Signed)
Renal Service Consult Note [facility]  Jason DELAROCHA Sr. 08/15/2020 Sol Blazing, MD Requesting Physician: Dr. Harvest Forest  Reason for Consult: ESRD pt w/ GIB HPI: The patient is a 75 y.o. year-old w/ hx of ESRD on HD, CVA, atrial fib,  HTN, COPD, hx rectal cancer, on chronic coumadin, presented to ED w/ dark stools since Saturday in setting of recent nsaid use. Also dark brown emesis. In ED iNR was high in the 8's range and Hb 7's down from 11's. BP's soft in ED initially but BP's much better now w/ PRBC's, FFP, vit K and fluid bolus. Got IV abx for wbc 20k. Getting IV PPI. Feeling much better. Asked to see for ESRD.   Pt seen in room. On HD about 3 yrs, TTS .  Takes coumadin. Says kidneys "shut down" after his colonoscopy years back.  He denies any CP , SOB or abd pain.    ROS  denies CP  no joint pain   no HA  no blurry vision  no rash  no diarrhea  no nausea/ vomiting    Past Medical History  Past Medical History:  Diagnosis Date  . Anemia   . Arthritis   . Atrial fibrillation ([place])    on Coumadin   . Chronic renal insufficiency 07/31/2011  . COPD (chronic obstructive pulmonary disease) ([place])    pt denies  . CVA (cerebral vascular accident) ([place]) 06/2018  . Deficiency anemia 07/20/2012  . Diverticulosis   . Edema 09/21/2010   Qualifier: Diagnosis of  By: Jerold Coombe    . ERECTILE DYSFUNCTION, MILD 05/11/2007   Qualifier: Diagnosis of  By: Sherren Mocha MD, Jory Ee   . Exertional dyspnea 10/30/2015  . GOUT 05/11/2007   Qualifier: Diagnosis of  By: Sherren Mocha MD, Jory Ee   . Hereditary and idiopathic peripheral neuropathy 09/14/2014  . History of radiation therapy 02/21/13-03/31/13   rectum 50.4Gy total dose  . Hypersomnia 10/30/2015  . Hypertension   . Lung nodule 10/30/2015  . OAB (overactive bladder) 01/09/2016  . Obesity (BMI 30.0-34.9) 10/30/2015  . Obstructive sleep apnea 07/29/2010   HST 11/2015 AHI 54.  On autocpap  >> 10 cm   . Peripheral edema 09/19/2013   . rectal ca dx'd 02/01/13   rectal. Radiation and chemotherapy- remains on chemotherapy-next tx. 08-22-13.   . S/P partial lobectomy of lung 11/20/2015  . S/P thoracotomy   . Sleep apnea    wears cpap   . SUI (stress urinary incontinence), male 01/09/2016   Past Surgical History  Past Surgical History:  Procedure Laterality Date  . A/V FISTULAGRAM Left 06/14/2018   Procedure: A/V FISTULAGRAM;  Surgeon: Waynetta Sandy, MD;  Location: [place] [facility];  Service: Cardiovascular;  Laterality: Left;  . AV FISTULA PLACEMENT Left 03/24/2016   Procedure: CREATION OF LEFT BRACIOCEPHALIC ARTERIOVENOUS (AV) FISTULA;  Surgeon: Elam Dutch, MD;  Location: [place];  Service: Vascular;  Laterality: Left;  . BIOPSY  09/17/2018   Procedure: BIOPSY;  Surgeon: Doran Stabler, MD;  Location: [facility];  Service: Gastroenterology;;  . BIOPSY  03/09/2019   Procedure: BIOPSY;  Surgeon: Doran Stabler, MD;  Location: [facility];  Service: Gastroenterology;;  . BIOPSY  11/22/2019   Procedure: BIOPSY;  Surgeon: Doran Stabler, MD;  Location: [facility];  Service: Gastroenterology;;  . COLON SURGERY  05/20/2013  . COLONOSCOPY  2010   [facility]  . COLONOSCOPY WITH PROPOFOL N/A 09/17/2018  Procedure: COLONOSCOPY WITH PROPOFOL;  Surgeon: Doran Stabler, MD;  Location: [facility];  Service: Gastroenterology;  Laterality: N/A;  . COLONOSCOPY WITH PROPOFOL N/A 11/22/2019   Procedure: COLONOSCOPY WITH PROPOFOL;  Surgeon: Doran Stabler, MD;  [facility];  Service: Gastroenterology;  Laterality: N/A;  Hx of colon polyps tt   . ESOPHAGOGASTRODUODENOSCOPY (EGD) WITH PROPOFOL N/A 09/17/2018   Procedure: ESOPHAGOGASTRODUODENOSCOPY (EGD) WITH PROPOFOL;  Surgeon: Doran Stabler, MD;  [facility];  Service: Gastroenterology;  Laterality: N/A;  . ESOPHAGOGASTRODUODENOSCOPY (EGD) WITH PROPOFOL N/A 03/09/2019   Procedure: ESOPHAGOGASTRODUODENOSCOPY (EGD) WITH PROPOFOL;   Surgeon: Doran Stabler, MD;  [facility];  Service: Gastroenterology;  Laterality: N/A;  . EUS N/A 02/03/2013   Procedure: LOWER ENDOSCOPIC ULTRASOUND (EUS);  Surgeon: Milus Banister, MD;  Location: [facility];  Service: Endoscopy;  Laterality: N/A;  . HEMOSTASIS CLIP PLACEMENT  11/22/2019   Procedure: HEMOSTASIS CLIP PLACEMENT;  Surgeon: Doran Stabler, MD;  [facility];  Service: Gastroenterology;;  . Lucianne Lei CLOSURE N/A 08/24/2013   Procedure: CLOSURE OF LOOP ILEOSTOMY ;  Surgeon: Leighton Ruff, MD;  Location: [facility];  Service: General;  Laterality: N/A;  . IR ANGIO VERTEBRAL SEL SUBCLAVIAN INNOMINATE BILAT MOD SED  06/08/2018  . IR CT HEAD LTD  06/08/2018  . IR INTRAVSC STENT CERV CAROTID W/O EMB-PROT MOD SED INC ANGIO  06/08/2018  . IR PERCUTANEOUS ART THROMBECTOMY/INFUSION INTRACRANIAL INC DIAG ANGIO  06/08/2018  . LAPAROSCOPIC LOW ANTERIOR RESECTION N/A 05/20/2013   Procedure: LAPAROSCOPIC LOW ANTERIOR RESECTION WITH SPLENIC FLEXURE MOBILIZATION;  Surgeon: Leighton Ruff, MD;  Location: [facility];  Service: General;  Laterality: N/A;  . OSTOMY N/A 05/20/2013   Procedure: diverting OSTOMY;  Surgeon: Leighton Ruff, MD;  Location: [facility];  Service: General;  Laterality: N/A;  . PERIPHERAL VASCULAR INTERVENTION Left 06/14/2018   Procedure: PERIPHERAL VASCULAR INTERVENTION;  Surgeon: Waynetta Sandy, MD;  Location: [place] [facility];  Service: Cardiovascular;  Laterality: Left;  AVF on the Left  . POLYPECTOMY    . POLYPECTOMY  11/22/2019   Procedure: POLYPECTOMY;  Surgeon: Doran Stabler, MD;  Location: [facility];  Service: Gastroenterology;;  . RADIOLOGY WITH ANESTHESIA N/A 06/08/2018   Procedure: IR WITH ANESTHESIA;  Surgeon: Radiologist, Medication, MD;  Location: [place];  Service: Radiology;  Laterality: N/A;  . REVISON OF ARTERIOVENOUS FISTULA Left [id number]/86/7619   Procedure: PLICATION/REVISON OF LEFT BRACHIOCEPHALIC ARTERIOVENOUS FISTULA;  Surgeon:  Elam Dutch, MD;  Location: [place];  Service: Vascular;  Laterality: Left;  . UPPER GASTROINTESTINAL ENDOSCOPY    . VIDEO ASSISTED THORACOSCOPY (VATS)/WEDGE RESECTION Left 11/20/2015   Procedure: VIDEO ASSISTED THORACOSCOPY (VATS)/LUNG RESECTION;  Surgeon: [name], MD;  Location: [place];  Service: Thoracic;  Laterality: Left;  Marland Kitchen VIDEO BRONCHOSCOPY N/A 11/20/2015   Procedure: VIDEO BRONCHOSCOPY;  Surgeon: [name], MD;  Location: [facility];  Service: Thoracic;  Laterality: N/A;   Family History  Family History  Problem Relation Age of Onset  . Stroke Mother   . Hypertension Mother   . Stroke Father   . Hypertension Father   . Heart disease Father   . CVA Father   . Hypertension Other   . Colon cancer Neg Hx   . Colon polyps Neg Hx   . Esophageal cancer Neg Hx   . Rectal cancer Neg Hx   . Stomach cancer Neg Hx    Social History  reports that he quit  smoking about 43 years ago. His smoking use included cigarettes. He has a 5.00 pack-year smoking history. He has never used smokeless tobacco. He reports previous alcohol use. He reports that he does not use drugs. Allergies  Allergies  Allergen Reactions  . Nsaids Other (See Comments)    Asked by surgeon to add this medication class as intolerance due to patients renal insufficiency.  . Lactase Diarrhea  . Lactose Intolerance (Gi) Diarrhea  . Phentermine Other (See Comments)    Unknown reaction  . Amlodipine Other (See Comments)    Edema    Home medications Prior to Admission medications   Medication Sig Start Date End Date Taking? Authorizing Provider  acetaminophen (TYLENOL) 325 MG tablet Take 650 mg by mouth every 4 (four) hours as needed for mild pain or moderate pain.  07/03/18  Yes [provider]  allopurinol (ZYLOPRIM) 100 MG tablet Take 1 tablet (100 mg total) by mouth daily. 04/03/20  Yes [name], MD  atorvastatin (LIPITOR) 20 MG tablet Take 1 tablet (20 mg total) by mouth every evening.  06/12/20  Yes [name], MD  BIDIL 20-37.5 MG tablet TAKE 1 TABLET BY MOUTH THREE TIMES DAILY Patient taking differently: Take 1 tablet by mouth 3 (three) times daily. 07/18/20  Yes Patwardhan, Manish J, MD  cinacalcet (SENSIPAR) 60 MG tablet Take 60 mg by mouth daily.   Yes [provider]  diphenoxylate-atropine (LOMOTIL) 2.5-0.025 MG tablet Take 1 tablet by mouth 4 (four) times daily. 07/09/20  Yes Danis, Kirke Corin, MD  FOSRENOL 1000 MG PACK Take 1,000 mg by mouth 3 (three) times daily with meals.  12/15/19  Yes [provider]  gabapentin (NEURONTIN) 100 MG capsule TAKE 1 CAPSULE BY MOUTH THREE TIMES DAILY Patient taking differently: Take 100 mg by mouth 3 (three) times daily. 06/18/20  Yes [name], MD  lipase/protease/amylase (CREON) 36000 UNITS CPEP capsule Take 2 capsules (72,000 Units total) by mouth 3 (three) times daily with meals. May also take 1 capsule (36,000 Units total) as needed (with snacks). 12/01/19  Yes Danis, Kirke Corin, MD  metoprolol succinate (TOPROL-XL) 25 MG 24 hr tablet Take 1 tablet (25 mg total) by mouth daily. 04/03/20  Yes [name], MD  ondansetron (ZOFRAN) 4 MG tablet TAKE ONE TABLET BY MOUTH EVERY 8 HOURS AS NEEDED 08/03/20  Yes Danis, Estill Cotta III, MD  oxyCODONE (OXY IR/ROXICODONE) 5 MG immediate release tablet Take 1 tablet (5 mg total) by mouth every 6 (six) hours as needed for moderate pain. 08/13/20  Yes [name], MD  polycarbophil (FIBERCON) 625 MG tablet Take 625 mg by mouth as needed for diarrhea or loose stools.    Yes [provider]  warfarin (COUMADIN) 5 MG tablet TAKE 1 TABLET BY MOUTH DAILY, EXCEPT 1/2 TABLET ON MONDAY AND FRIDAYS OR TAKE AS DIRECTED BY ANTICOAGULATION CLINIC Patient taking differently: Take 2.5-5 mg by mouth See admin instructions. Take 5 mg on Sun, Tue, Wed, Thurs, and Sat. Take 2.5 mg on Mon and Fri. 04/03/20  Yes [name], MD     Vitals:   08/15/20 1115 08/15/20 1215 08/15/20  1245 08/15/20 1540  BP: (!) 94/47 (!) 108/52 (!) 111/54 (!) 115/54  Pulse: 91 92 95 98  Resp: 11 14 (!) 8 16  Temp:  98.1 F (36.7 C)  98.6 F (37 C)  TempSrc:  Oral  Oral  SpO2: 100% 100% 100% 100%  Weight:    63.5  kg  Height:    5\' 8"  (1.727 m)   Exam Gen nasal O2, alert, nad No rash, cyanosis or gangrene Sclera anicteric, throat clear  No jvd or bruits Chest clear bilat to bases no rales or wheezing RRR no MRG Abd soft ntnd no mass or ascites +bs GU normal male MS no joint effusions or deformity Ext 1+ R pretib edema, no LLE edema Neuro is alert, Ox 3 , nf LUA AVF+bruit/ bandaged    Home meds:  - bidil tid / metoprolol xl 25 qd  - warfarin qd  - oxy IR qid prn/ neurontin 100 tid  - lomotil qid/ creon ac tid/ prn zofran/ fibercon  - zyloprim 100 / lipitor 20/ sensipar 60 qd  - prn's/ vitamins/ supplements    OP HD: [place] TTS  4h  3K/2Ca bath  62kg (coming off under) Hep none  LUA AVF  - hect 7 ug  - venofer 50 q wk  - mircera 50 q4 wks, last 1/25   Assessment/ Plan: 1. GI bleed - w/ melena 2. ESRD on HD - had HD yest and no sig vol excess today. Plan HD tomorrow.  3. H/o CVA 4. Atrial fib - on coumadin, high INR, per pmd 5. Hypotension - prob d/t GIB, improved w/ blood products and IVF in ED.  6. Volume - close to dry, no vol^ on exam 7. HTN - on BB and bidil, resume prn 8. COPD 9. Anemia CKD + GIB - next esa due , will order darbe 60 ug w/ HD 2/10 10. MBD ckd - cont meds       Rob Kacy Hegna  MD 08/15/2020, 4:20 PM  Recent Labs  Lab 08/15/20 0659 08/15/20 0725 08/15/20 1248  WBC 25.0* 26.2*  --   HGB 7.0* 7.1* 7.2*   Recent Labs  Lab 08/15/20 0659 08/15/20 0725  K 4.3 4.7  BUN 73* 72*  CREATININE 6.83* 6.83*  CALCIUM 9.4 9.5

## 2020-08-15 NOTE — Telephone Encounter (Signed)
Okay, got it. Jason [name], in case you are called today

## 2020-08-15 NOTE — ED Notes (Signed)
Continue to have trouble getting pts O2 sat to register. Lungs remain clear bilaterally. Pt able to talk without difficulty.

## 2020-08-15 NOTE — Sepsis Progress Note (Signed)
Sepsis protocol being followed by [facility]

## 2020-08-15 NOTE — H&P (View-Only) (Signed)
Referring Provider:  [facility]         Primary Care Physician:  [name], MD Primary Gastroenterologist:  Wilfrid Lund, MD            We were asked to see this patient for:      Diarrhea, anemia, heme+ stool            ASSESSMENT / PLAN:    # GI bleed with shock. Presumably hemorrhagic shock secondary to upper bleed with in setting of supratherapeutic INR of 8.3. Dark liquid stool on DRE. Acute drop in hgb from baseline of mid 11 to 7.1. He has been taking Ibuprofen for the last 5 days. Rule out PUD. --Getting FFP, Vitamin K 10 mg IV --Will need EGD with INR improves. In interim, PPI infusion, supportive care. Tachycardia and hypotension improving, s/p liter of IVF  --Blood transfusion already in progress.  --NPO for now, will consider sips of clears later depending on clinical course  # Atrial fibrillation, on Coumadin, supratherapeutic INR of 8.3  # Chronic diarrhea, worse since yesterday but I think it is because he is having active GI bleed. Don't suspect infectious etiology at this point.   # ESRD on HD Tu, Th, Sat  # Other medical problems : Hx of CVA, OSA, HTN, COPD  # History of rectal cancer, status post laparoscopic LAR with diverting loop ileostomy 05/23/2013. Final pathology showed a 3.2 cm invasive adenocarcinoma with neoadjuvant related change. Tumor invaded into perirectal soft tissue. There was no lymphovascular or perineural invasion. 14 lymph nodes were negative for tumor. Surgical margins were negative. A 12 mm tubular adenoma without HGD was removed at time of last colonoscopy in May 2021     HPI:                                                                                                                             Chief Complaint:  Diarrhea and abdominal pain   Jason WICKLIFF Sr. is a 75 y.o. male followed by Dr. Loletha Carrow for chronic diarrhea and colon polyps. Cause of diarrhea never clearly determined despite extensive work-up and empiric  treatment for C. difficile colitis and pancreatic insufficiency.  He has also tried Viberzi and been treated with Xifaxan.  Patient was last seen September 2021  Patient presented to ED this am with abdominal pain and diarrhea. Patient has chronic, diarrhea which became acutely worse yesterday. Also he describes black stool. He starting taking Ibuprofen on Friday for leg pain. The abdominal pain is intermittent, achy, non-radiating. He has had associated nausea / vomiting and describes dark emesis yesterday.  In ED he was shocky with tachycardia.  Hgb 7, down from baseline of mid 11 just a few days ago. Has ESRD, on HD but BUN markedly elevated at 72. His lactic acid level is high at 4.1, Troponin high at 33.     PREVIOUS ENDOSCOPIC EVALUATIONS / [place] STUDIES   May  2021 Colonoscopy for evaluation of diarrhea and removal of polyps not previously removed since patient was on brillinta  Preparation of the colon was fair. - Rectal stricture found on digital rectal exam. - Diverticulosis in the left colon and in the right colon. - Patent end-to-end colo-rectal anastomosis, characterized by healthy appearing mucosa. - One 12 mm polyp in the mid ascending colon, removed piecemeal using a hot snare. Resected and retrieved. Clips (MR conditional) were placed. - Normal mucosa in the entire examined colon. Biopsied. Clips (MR conditional) were placed. - The examination was otherwise normal  FINAL MICROSCOPIC DIAGNOSIS:   A. COLON, ASCENDING, POLYPECTOMY:  - Tubular adenoma(s).  - High grade dysplasia is not identified.   B. COLON, RANDOM, BIOPSY:  - Essentially unremarkable colorectal type mucosa.  - There is no evidence of significant inflammation, dysplasia, or  malignancy.   Sept 2020 EGD for evaluation of duodenal wall thickening on CT scan and diarrhea Normal esophagus. - Normal stomach. - Normal examined duodenum. Biopsied.  Path = peptic duodenitis  Past Medical History:   Diagnosis Date  . Anemia   . Arthritis   . Atrial fibrillation ([place])    on Coumadin   . Chronic renal insufficiency 07/31/2011  . COPD (chronic obstructive pulmonary disease) ([place])    pt denies  . CVA (cerebral vascular accident) ([place]) 06/2018  . Deficiency anemia 07/20/2012  . Diverticulosis   . Edema 09/21/2010   Qualifier: Diagnosis of  By: Jerold Coombe    . ERECTILE DYSFUNCTION, MILD 05/11/2007   Qualifier: Diagnosis of  By: Sherren Mocha MD, Jory Ee   . Exertional dyspnea 10/30/2015  . GOUT 05/11/2007   Qualifier: Diagnosis of  By: Sherren Mocha MD, Jory Ee   . Hereditary and idiopathic peripheral neuropathy 09/14/2014  . History of radiation therapy 02/21/13-03/31/13   rectum 50.4Gy total dose  . Hypersomnia 10/30/2015  . Hypertension   . Lung nodule 10/30/2015  . OAB (overactive bladder) 01/09/2016  . Obesity (BMI 30.0-34.9) 10/30/2015  . Obstructive sleep apnea 07/29/2010   HST 11/2015 AHI 54.  On autocpap  >> 10 cm   . Peripheral edema 09/19/2013  . rectal ca dx'd 02/01/13   rectal. Radiation and chemotherapy- remains on chemotherapy-next tx. 08-22-13.   . S/P partial lobectomy of lung 11/20/2015  . S/P thoracotomy   . Sleep apnea    wears cpap   . SUI (stress urinary incontinence), male 01/09/2016    Past Surgical History:  Procedure Laterality Date  . A/V FISTULAGRAM Left 06/14/2018   Procedure: A/V FISTULAGRAM;  Surgeon: Waynetta Sandy, MD;  Location: [facility];  Service: Cardiovascular;  Laterality: Left;  . AV FISTULA PLACEMENT Left 03/24/2016   Procedure: CREATION OF LEFT BRACIOCEPHALIC ARTERIOVENOUS (AV) FISTULA;  Surgeon: Elam Dutch, MD;  Location: [facility];  Service: Vascular;  Laterality: Left;  . BIOPSY  09/17/2018   Procedure: BIOPSY;  Surgeon: Doran Stabler, MD;  Location: [facility];  Service: Gastroenterology;;  . BIOPSY  03/09/2019   Procedure: BIOPSY;  Surgeon: Doran Stabler, MD;  Location: [facility];  Service: Gastroenterology;;  . BIOPSY   11/22/2019   Procedure: BIOPSY;  Surgeon: Doran Stabler, MD;  Location: [facility];  Service: Gastroenterology;;  . COLON SURGERY  05/20/2013  . COLONOSCOPY  2010   [facility]  . COLONOSCOPY WITH PROPOFOL N/A 09/17/2018   Procedure: COLONOSCOPY WITH PROPOFOL;  Surgeon: Doran Stabler, MD;  Location: [facility];  Service: Gastroenterology;  Laterality: N/A;  . COLONOSCOPY WITH PROPOFOL N/A 11/22/2019   Procedure: COLONOSCOPY WITH PROPOFOL;  Surgeon: Doran Stabler, MD;  Location: [facility];  Service: Gastroenterology;  Laterality: N/A;  Hx of colon polyps tt   . ESOPHAGOGASTRODUODENOSCOPY (EGD) WITH PROPOFOL N/A 09/17/2018   Procedure: ESOPHAGOGASTRODUODENOSCOPY (EGD) WITH PROPOFOL;  Surgeon: Doran Stabler, MD;  [facility];  Service: Gastroenterology;  Laterality: N/A;  . ESOPHAGOGASTRODUODENOSCOPY (EGD) WITH PROPOFOL N/A 03/09/2019   Procedure: ESOPHAGOGASTRODUODENOSCOPY (EGD) WITH PROPOFOL;  Surgeon: Doran Stabler, MD;  [facility]: [facility];  Service: Gastroenterology;  Laterality: N/A;  . EUS N/A 02/03/2013   Procedure: LOWER ENDOSCOPIC ULTRASOUND (EUS);  Surgeon: Milus Banister, MD;  Location: [facility];  Service: Endoscopy;  Laterality: N/A;  . HEMOSTASIS CLIP PLACEMENT  11/22/2019   Procedure: HEMOSTASIS CLIP PLACEMENT;  Surgeon: Doran Stabler, MD;  [facility];  Service: Gastroenterology;;  . Lucianne Lei CLOSURE N/A 08/24/2013   Procedure: CLOSURE OF LOOP ILEOSTOMY ;  Surgeon: Leighton Ruff, MD;  Location: [facility];  Service: General;  Laterality: N/A;  . IR ANGIO VERTEBRAL SEL SUBCLAVIAN INNOMINATE BILAT MOD SED  06/08/2018  . IR CT HEAD LTD  06/08/2018  . IR INTRAVSC STENT CERV CAROTID W/O EMB-PROT MOD SED INC ANGIO  06/08/2018  . IR PERCUTANEOUS ART THROMBECTOMY/INFUSION INTRACRANIAL INC DIAG ANGIO  06/08/2018  . LAPAROSCOPIC LOW ANTERIOR RESECTION N/A 05/20/2013   Procedure: LAPAROSCOPIC LOW ANTERIOR RESECTION WITH SPLENIC FLEXURE  MOBILIZATION;  Surgeon: Leighton Ruff, MD;  Location: [facility];  Service: General;  Laterality: N/A;  . OSTOMY N/A 05/20/2013   Procedure: diverting OSTOMY;  Surgeon: Leighton Ruff, MD;  Location: [facility];  Service: General;  Laterality: N/A;  . PERIPHERAL VASCULAR INTERVENTION Left 06/14/2018   Procedure: PERIPHERAL VASCULAR INTERVENTION;  Surgeon: Waynetta Sandy, MD;  Location: [facility];  Service: Cardiovascular;  Laterality: Left;  AVF on the Left  . POLYPECTOMY    . POLYPECTOMY  11/22/2019   Procedure: POLYPECTOMY;  Surgeon: Doran Stabler, MD;  Location: [facility];  Service: Gastroenterology;;  . RADIOLOGY WITH ANESTHESIA N/A 06/08/2018   Procedure: IR WITH ANESTHESIA;  Surgeon: Radiologist, Medication, MD;  Location: [facility];  Service: Radiology;  Laterality: N/A;  . REVISON OF ARTERIOVENOUS FISTULA Left [id number]/4332   Procedure: PLICATION/REVISON OF LEFT BRACHIOCEPHALIC ARTERIOVENOUS FISTULA;  Surgeon: Elam Dutch, MD;  Location: [facility];  Service: Vascular;  Laterality: Left;  . UPPER GASTROINTESTINAL ENDOSCOPY    . VIDEO ASSISTED THORACOSCOPY (VATS)/WEDGE RESECTION Left 11/20/2015   Procedure: VIDEO ASSISTED THORACOSCOPY (VATS)/LUNG RESECTION;  Surgeon: [name], MD;  Location: [facility];  Service: Thoracic;  Laterality: Left;  Marland Kitchen VIDEO BRONCHOSCOPY N/A 11/20/2015   Procedure: VIDEO BRONCHOSCOPY;  Surgeon: [name], MD;  Location: [facility];  Service: Thoracic;  Laterality: N/A;    Prior to Admission medications   Medication Sig Start Date End Date Taking? Authorizing Provider  acetaminophen (TYLENOL) 325 MG tablet Take 650 mg by mouth every 4 (four) hours as needed for mild pain or moderate pain.  07/03/18   [provider]  allopurinol (ZYLOPRIM) 100 MG tablet Take 1 tablet (100 mg total) by mouth daily. 04/03/20   [name], MD  atorvastatin (LIPITOR) 20 MG tablet Take 1 tablet (20 mg total) by mouth every evening. 06/12/20   [name], MD  BIDIL 20-37.5 MG tablet TAKE 1 TABLET BY MOUTH THREE TIMES DAILY 07/18/20   Patwardhan, Reynold Bowen,  MD  cinacalcet (SENSIPAR) 60 MG tablet Take 60 mg by mouth daily.    [provider]  diphenoxylate-atropine (LOMOTIL) 2.5-0.025 MG tablet Take 1 tablet by mouth 4 (four) times daily. 07/09/20   Doran Stabler, MD  FOSRENOL 1000 MG PACK Take 1,000 mg by mouth 3 (three) times daily with meals.  12/15/19   [provider]  gabapentin (NEURONTIN) 100 MG capsule TAKE 1 CAPSULE BY MOUTH THREE TIMES DAILY 06/18/20   [name], MD  heparin 1000 unit/mL SOLN injection Heparin Sodium (Porcine) 1,000 Units/mL Catheter Lock Arterial 04/19/20 04/20/21  [provider]  lipase/protease/amylase (CREON) 36000 UNITS CPEP capsule Take 2 capsules (72,000 Units total) by mouth 3 (three) times daily with meals. May also take 1 capsule (36,000 Units total) as needed (with snacks). 12/01/19   Doran Stabler, MD  Methoxy PEG-Epoetin Beta (MIRCERA IJ) Mircera 02/14/20 02/12/21  [provider]  metoprolol succinate (TOPROL-XL) 25 MG 24 hr tablet Take 1 tablet (25 mg total) by mouth daily. 04/03/20   [name], MD  ondansetron (ZOFRAN) 4 MG tablet TAKE ONE TABLET BY MOUTH EVERY 8 HOURS AS NEEDED 08/03/20   Doran Stabler, MD  oxyCODONE (OXY IR/ROXICODONE) 5 MG immediate release tablet Take 1 tablet (5 mg total) by mouth every 6 (six) hours as needed for moderate pain. 08/13/20   [name], MD  polycarbophil (FIBERCON) 625 MG tablet Take 625 mg by mouth as needed for diarrhea or loose stools.     [provider]  warfarin (COUMADIN) 5 MG tablet TAKE 1 TABLET BY MOUTH DAILY, EXCEPT 1/2 TABLET ON MONDAY AND FRIDAYS OR TAKE AS DIRECTED BY ANTICOAGULATION CLINIC Patient taking differently: Take 2.5-5 mg by mouth See admin instructions. Take 5 mg on Sun, Tue, Wed, Thurs, and Sat. Take 2.5 mg on Mon and Fri. 04/03/20   [name], MD    No current  facility-administered medications for this encounter.   Current Outpatient Medications  Medication Sig Dispense Refill  . acetaminophen (TYLENOL) 325 MG tablet Take 650 mg by mouth every 4 (four) hours as needed for mild pain or moderate pain.     Marland Kitchen allopurinol (ZYLOPRIM) 100 MG tablet Take 1 tablet (100 mg total) by mouth daily. 90 tablet 1  . atorvastatin (LIPITOR) 20 MG tablet Take 1 tablet (20 mg total) by mouth every evening. 90 tablet 1  . BIDIL 20-37.5 MG tablet TAKE 1 TABLET BY MOUTH THREE TIMES DAILY 90 tablet 0  . cinacalcet (SENSIPAR) 60 MG tablet Take 60 mg by mouth daily.    . diphenoxylate-atropine (LOMOTIL) 2.5-0.025 MG tablet Take 1 tablet by mouth 4 (four) times daily. 120 tablet 4  . FOSRENOL 1000 MG PACK Take 1,000 mg by mouth 3 (three) times daily with meals.     . gabapentin (NEURONTIN) 100 MG capsule TAKE 1 CAPSULE BY MOUTH THREE TIMES DAILY 90 capsule 5  . heparin 1000 unit/mL SOLN injection Heparin Sodium (Porcine) 1,000 Units/mL Catheter Lock Arterial    . lipase/protease/amylase (CREON) 36000 UNITS CPEP capsule Take 2 capsules (72,000 Units total) by mouth 3 (three) times daily with meals. May also take 1 capsule (36,000 Units total) as needed (with snacks). 240 capsule 11  . Methoxy PEG-Epoetin Beta (MIRCERA IJ) Mircera    . metoprolol succinate (TOPROL-XL) 25 MG 24 hr tablet Take 1 tablet (25 mg total) by mouth daily. 90 tablet 1  . ondansetron (ZOFRAN) 4 MG tablet TAKE ONE TABLET  BY MOUTH EVERY 8 HOURS AS NEEDED 45 tablet 1  . oxyCODONE (OXY IR/ROXICODONE) 5 MG immediate release tablet Take 1 tablet (5 mg total) by mouth every 6 (six) hours as needed for moderate pain. 30 tablet 0  . polycarbophil (FIBERCON) 625 MG tablet Take 625 mg by mouth as needed for diarrhea or loose stools.     . warfarin (COUMADIN) 5 MG tablet TAKE 1 TABLET BY MOUTH DAILY, EXCEPT 1/2 TABLET ON MONDAY AND FRIDAYS OR TAKE AS DIRECTED BY ANTICOAGULATION CLINIC (Patient taking differently: Take  2.5-5 mg by mouth See admin instructions. Take 5 mg on Sun, Tue, Wed, Thurs, and Sat. Take 2.5 mg on Mon and Fri.) 90 tablet 1    Allergies as of 08/15/2020 - Review Complete 08/15/2020  Allergen Reaction Noted  . Nsaids Other (See Comments) 05/20/2013  . Lactase Diarrhea 03/30/2019  . Lactose intolerance (gi) Diarrhea 06/17/2018  . Phentermine Other (See Comments) 03/30/2019  . Amlodipine Other (See Comments) 10/02/2015    Family History  Problem Relation Age of Onset  . Stroke Mother   . Hypertension Mother   . Stroke Father   . Hypertension Father   . Heart disease Father   . CVA Father   . Hypertension Other   . Colon cancer Neg Hx   . Colon polyps Neg Hx   . Esophageal cancer Neg Hx   . Rectal cancer Neg Hx   . Stomach cancer Neg Hx     Social History   Socioeconomic History  . Marital status: Married    Spouse name: [name]  . Number of children: 2  . Years of education: 32  . Highest education level: Not on file  Occupational History    Comment: retired Insurance account manager  Tobacco Use  . Smoking status: Former Smoker    Packs/day: 0.50    Years: 10.00    Pack years: 5.00    Types: Cigarettes    Quit date: 07/07/1977    Years since quitting: 43.1  . Smokeless tobacco: Never Used  Vaping Use  . Vaping Use: Never used  Substance and Sexual Activity  . Alcohol use: Not Currently    Alcohol/week: 0.0 standard drinks  . Drug use: No  . Sexual activity: Not Currently  Other Topics Concern  . Not on file  Social History Narrative   Married to wife, [name]   Retired Insurance account manager w/[employer]   #2 grown children and #6 grandchildren   Social Determinants of Health   Financial Resource Strain: [place]   . Difficulty of Paying Living Expenses: Not hard at all  Food Insecurity: No Food Insecurity  . Worried About Charity fundraiser in the Last Year: Never true  . Ran Out of Food in the Last Year: Never true  Transportation Needs: No  Transportation Needs  . Lack of Transportation (Medical): No  . Lack of Transportation (Non-Medical): No  Physical Activity: Sufficiently Active  . Days of Exercise per Week: 5 days  . Minutes of Exercise per Session: 30 min  Stress: No Stress Concern Present  . Feeling of Stress : Not at all  Social Connections: Socially Integrated  . Frequency of Communication with Friends and Family: More than three times a week  . Frequency of Social Gatherings with Friends and Family: More than three times a week  . Attends Religious Services: More than 4 times per year  . Active Member of Clubs or Organizations: Not on  file  . Attends Club or Organization Meetings: More than 4 times per year  . Marital Status: Married  Human resources officer Violence: Not on file    Review of Systems: All systems reviewed and negative except where noted in HPI.  OBJECTIVE:    Physical Exam: Vital signs in last 24 hours: Temp:  [97.4 F (36.3 C)-98.2 F (36.8 C)] 98.2 F (36.8 C) (02/09 1025) Pulse Rate:  [94-122] 94 (02/09 1025) Resp:  [11-16] 16 (02/09 1025) BP: (79-106)/(45-63) 106/56 (02/09 1015) SpO2:  [95 %] 95 % (02/09 0656)   General:   Alert thin male in NAD Psych:  Pleasant, cooperative. Normal mood and affect. Eyes:  Pupils equal, sclera clear, no icterus.   Conjunctiva pink. Ears:  Normal auditory acuity. Nose:  No deformity, discharge,  or lesions. Neck:  Supple; no masses Lungs:  Clear throughout to auscultation.   No wheezes, crackles, or rhonchi.  Heart:  Regular rate and rhythm; 1-2+ RLE edema Abdomen:  Soft, non-distended, nontender, BS active, no palp mass   Rectal:  Rectal stricture. Dark liquid stool in vault.   Msk:  Symmetrical without gross deformities. . Neurologic:  Alert and  oriented x4;  grossly normal neurologically. Skin:  Intact without significant lesions or rashes.   Intake/Output from previous day: No intake/output data recorded. Intake/Output this shift: No  intake/output data recorded.   Lab Results: Recent Labs    08/15/20 0659 08/15/20 0725  WBC 25.0* 26.2*  HGB 7.0* 7.1*  HCT 21.3* 22.1*  PLT 308 258   BMET Recent Labs    08/15/20 0659 08/15/20 0725  NA 142 143  K 4.3 4.7  CL 92* 94*  CO2 24 23  GLUCOSE 99 91  BUN 73* 72*  CREATININE 6.83* 6.83*  CALCIUM 9.4 9.5   LFT Recent Labs    08/15/20 0725  PROT 6.3*  ALBUMIN 3.1*  AST 25  ALT 11  ALKPHOS 88  BILITOT 1.4*   PT/INR Recent Labs    08/13/20 0000 08/15/20 0659  LABPROT  --  66.9*  INR 4.8* 8.3*   Hepatitis Panel No results for input(s): HEPBSAG, HCVAB, HEPAIGM, HEPBIGM in the last 72 hours.   . CBC Latest Ref Rng & Units 08/15/2020 08/15/2020 08/10/2020  WBC 4.0 - 10.5 K/uL 26.2(H) 25.0(H) 5.9  Hemoglobin 13.0 - 17.0 g/dL 7.1(L) 7.0(L) 11.7(L)  Hematocrit 39.0 - 52.0 % 22.1(L) 21.3(L) 34.8(L)  Platelets 150 - 400 K/uL 258 308 247    . CMP Latest Ref Rng & Units 08/15/2020 08/15/2020 08/10/2020  Glucose 70 - 99 mg/dL 91 99 77  BUN 8 - 23 mg/dL 72(H) 73(H) 32(H)  Creatinine 0.61 - 1.24 mg/dL 6.83(H) 6.83(H) 6.84(H)  Sodium 135 - 145 mmol/L 143 142 138  Potassium 3.5 - 5.1 mmol/L 4.7 4.3 4.2  Chloride 98 - 111 mmol/L 94(L) 92(L) 96(L)  CO2 22 - 32 mmol/L 23 24 25   Calcium 8.9 - 10.3 mg/dL 9.5 9.4 9.7  Total Protein 6.5 - 8.1 g/dL 6.3(L) 6.2(L) -  Total Bilirubin 0.3 - 1.2 mg/dL 1.4(H) 1.2 -  Alkaline Phos 38 - 126 U/L 88 88 -  AST 15 - 41 U/L 25 28 -  ALT 0 - 44 U/L 11 11 -   Studies/Results: DG Chest Port 1 View  Result Date: 08/15/2020 CLINICAL DATA:  Shortness of breath. EXAM: PORTABLE CHEST 1 VIEW COMPARISON:  04/08/2019 FINDINGS: 0748 hours. The lungs are clear without focal pneumonia, edema, pneumothorax or pleural effusion. Skin folds project over  each upper hemithorax. The cardiopericardial silhouette is within normal limits for size. Vascular stent device noted left subclavian region. Telemetry leads overlie the chest. IMPRESSION: No active  disease. Electronically Signed   By: Misty Stanley M.D.   On: 08/15/2020 08:03    Active Problems:   * No active hospital problems. Tye Savoy, NP-C @  08/15/2020, 10:25 AM

## 2020-08-15 NOTE — Progress Notes (Signed)
Pharmacy Antibiotic Note  Jason POPELKA Sr. is a 75 y.o. male admitted on 08/15/2020 with intra-abdominal infection.  ESRD on HD TTS.  Pharmacy has been consulted for cefepime dosing.  WBC 26.2, lactate 4.1, Tm 98.2. Patient HD schedule TTS.  Will place order for cefepime 1g IV q24h until nephrology sees patient to determine if same HD schedule inpatient. Can switch dosing to Cefepime 2g IV qHD session if patient will stay on current dialysis schedule.   Plan:  Cefepime 2g IV x1 Start cefepime 1g IV q24h tomorrow F/u HD plans and adj dosing as needed F/u cultures Monitor clinical improvement, narrow abx as needed     Temp (24hrs), Avg:97.9 F (36.6 C), Min:97.4 F (36.3 C), Max:98.2 F (36.8 C)  Recent Labs  Lab 08/10/20 1433 08/15/20 0659 08/15/20 0725 08/15/20 0912  WBC 5.9 25.0* 26.2*  --   CREATININE 6.84* 6.83* 6.83*  --   LATICACIDVEN  --   --   --  4.1*    Estimated Creatinine Clearance: 8.7 mL/min (A) (by C-G formula based on SCr of 6.83 mg/dL (H)).    Allergies  Allergen Reactions  . Nsaids Other (See Comments)    Asked by surgeon to add this medication class as intolerance due to patients renal insufficiency.  . Lactase Diarrhea  . Lactose Intolerance (Gi) Diarrhea  . Phentermine Other (See Comments)    Unknown reaction  . Amlodipine Other (See Comments)    Edema     Antimicrobials this admission: Cefepime 2/9 >>  Flagyl 2/9 >> 2/9   Microbiology results: 2/9 BCx: sent  Thank you for allowing pharmacy to be a part of this patient's care.  Dimple Nanas, PharmD PGY-1 Acute Care Pharmacy Resident Office: [phone] 08/15/2020 12:20 PM

## 2020-08-15 NOTE — ED Provider Notes (Signed)
[facility] EMERGENCY DEPARTMENT Provider Note   CSN: [id number] Arrival date & time: 08/15/20  2671     History Chief Complaint  Patient presents with  . Abdominal Pain    Jason BALAN Sr. is a 75 y.o. male.  He is here with a complaint of upper abdominal pain and diarrhea that started last evening.  He said he has had this before.  3 episodes of diarrhea.  6 out of 10 abdominal pain.  Some nausea and a little bit of vomiting.  Gets dialysis Tuesday Thursday Saturday and had dialysis yesterday.  No chest pain.  A little bit short of breath.  The history is provided by the patient.  Abdominal Pain Pain location:  Generalized Pain quality: cramping   Pain severity:  Moderate Onset quality:  Gradual Timing:  Constant Progression:  Unchanged Chronicity:  Recurrent Relieved by:  None tried Worsened by:  Nothing Ineffective treatments:  None tried Associated symptoms: diarrhea, nausea, shortness of breath and vomiting   Associated symptoms: no chest pain, no cough, no dysuria, no fever, no hematemesis, no hematochezia, no hematuria, no melena and no sore throat        Past Medical History:  Diagnosis Date  . Anemia   . Arthritis   . Atrial fibrillation ([place])    on Coumadin   . Chronic renal insufficiency 07/31/2011  . COPD (chronic obstructive pulmonary disease) ([place])    pt denies  . CVA (cerebral vascular accident) ([place]) 06/2018  . Deficiency anemia 07/20/2012  . Diverticulosis   . Edema 09/21/2010   Qualifier: Diagnosis of  By: Jerold Coombe    . ERECTILE DYSFUNCTION, MILD 05/11/2007   Qualifier: Diagnosis of  By: Sherren Mocha MD, Jory Ee   . Exertional dyspnea 10/30/2015  . GOUT 05/11/2007   Qualifier: Diagnosis of  By: Sherren Mocha MD, Jory Ee   . Hereditary and idiopathic peripheral neuropathy 09/14/2014  . History of radiation therapy 02/21/13-03/31/13   rectum 50.4Gy total dose  . Hypersomnia 10/30/2015  . Hypertension   . Lung nodule 10/30/2015  . OAB  (overactive bladder) 01/09/2016  . Obesity (BMI 30.0-34.9) 10/30/2015  . Obstructive sleep apnea 07/29/2010   HST 11/2015 AHI 54.  On autocpap  >> 10 cm   . Peripheral edema 09/19/2013  . rectal ca dx'd 02/01/13   rectal. Radiation and chemotherapy- remains on chemotherapy-next tx. 08-22-13.   . S/P partial lobectomy of lung 11/20/2015  . S/P thoracotomy   . Sleep apnea    wears cpap   . SUI (stress urinary incontinence), male 01/09/2016    Patient Active Problem List   Diagnosis Date Noted  . GI bleed 08/15/2020  . Acute blood loss anemia 08/15/2020  . Supratherapeutic INR 08/15/2020  . Fibula fracture 08/15/2020  . Melena   . Leg edema, right 08/13/2020  . Injury of right leg 08/13/2020  . Closed nondisplaced segmental fracture of shaft of right fibula 08/13/2020  . Cellulitis of right leg 08/06/2020  . Complication of vascular dialysis catheter 04/18/2020  . Other disorders of phosphorus metabolism 04/04/2020  . Allergy, unspecified, initial encounter 03/01/2020  . Anaphylactic shock, unspecified, initial encounter 03/01/2020  . Fever 01/16/2020  . Benign prostatic hyperplasia without lower urinary tract symptoms 12/12/2019  . Meralgia paraesthetica, right 08/12/2019  . 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 Gait disturbance, post-stroke 04/17/2019  . Iron deficiency anemia, unspecified 03/14/2019  . Chronic obstructive pulmonary disease, unspecified ([place]) 01/25/2019  . Coagulation defect,  unspecified ([place]) 01/25/2019  . Diarrhea, unspecified 01/25/2019  . Hypertensive chronic kidney disease with stage 5 chronic kidney disease or end stage renal disease ([place]) 01/25/2019  . Diverticulosis of intestine, part unspecified, without perforation or abscess without bleeding 01/25/2019  . Other idiopathic peripheral autonomic neuropathy 01/25/2019  . Hypokalemia 01/25/2019  . Male erectile dysfunction, unspecified 01/25/2019  . Pain, unspecified 01/25/2019  .  Primary generalized (osteo)arthritis 01/25/2019  . Pruritus, unspecified 01/25/2019  . Secondary hyperparathyroidism of renal origin ([place]) 01/25/2019  . Anticoagulated 12/31/2018  . Paroxysmal atrial fibrillation ([place]) 12/22/2018  . Longstanding persistent atrial fibrillation ([place]) 09/20/2018  . Functional diarrhea 07/14/2018  . Benign essential HTN   . ESRD on dialysis ([place]) 06/16/2018  . Mild malnutrition ([place]) 06/16/2018  . Stroke due to embolism ([place]) 06/16/2018  . Chronic HFrEF (heart failure with reduced ejection fraction) ([place])   . CVA (cerebral vascular accident) ([place]) 06/08/2018  . CHF (congestive heart failure) ([place]) 05/08/2018  . Hyperlipidemia LDL goal <70 09/23/2017  . OAB (overactive bladder) 01/09/2016  . SUI (stress urinary incontinence), male 01/09/2016  . Hypersomnia 10/30/2015  . Obesity (BMI 30.0-34.9) 10/30/2015  . Routine general medical examination at a health care facility 06/04/2015  . Medicare annual wellness visit, subsequent 06/04/2015  . Hereditary and idiopathic peripheral neuropathy 09/14/2014  . Rectal cancer ([place]) 02/01/2013  . Obstructive sleep apnea 07/29/2010  . GOUT 05/11/2007  . ERECTILE DYSFUNCTION, MILD 05/11/2007  . Essential hypertension 05/11/2007    Past Surgical History:  Procedure Laterality Date  . A/V FISTULAGRAM Left 06/14/2018   Procedure: A/V FISTULAGRAM;  Surgeon: Waynetta Sandy, MD;  Location: [facility];  Service: Cardiovascular;  Laterality: Left;  . AV FISTULA PLACEMENT Left 03/24/2016   Procedure: CREATION OF LEFT BRACIOCEPHALIC ARTERIOVENOUS (AV) FISTULA;  Surgeon: Elam Dutch, MD;  Location: [facility];  Service: Vascular;  Laterality: Left;  . BIOPSY  09/17/2018   Procedure: BIOPSY;  Surgeon: Doran Stabler, MD;  Location: [facility];  Service: Gastroenterology;;  . BIOPSY  03/09/2019   Procedure: BIOPSY;  Surgeon: Doran Stabler, MD;  Location: [facility];  Service: Gastroenterology;;  .  BIOPSY  11/22/2019   Procedure: BIOPSY;  Surgeon: Doran Stabler, MD;  Location: [facility];  Service: Gastroenterology;;  . COLON SURGERY  05/20/2013  . COLONOSCOPY  2010   [facility]  . COLONOSCOPY WITH PROPOFOL N/A 09/17/2018   Procedure: COLONOSCOPY WITH PROPOFOL;  Surgeon: Doran Stabler, MD;  Location: [facility];  Service: Gastroenterology;  Laterality: N/A;  . COLONOSCOPY WITH PROPOFOL N/A 11/22/2019   Procedure: COLONOSCOPY WITH PROPOFOL;  Surgeon: Doran Stabler, MD;  [facility];  Service: Gastroenterology;  Laterality: N/A;  Hx of colon polyps tt   . ESOPHAGOGASTRODUODENOSCOPY (EGD) WITH PROPOFOL N/A 09/17/2018   Procedure: ESOPHAGOGASTRODUODENOSCOPY (EGD) WITH PROPOFOL;  Surgeon: Doran Stabler, MD;  [facility];  Service: Gastroenterology;  Laterality: N/A;  . ESOPHAGOGASTRODUODENOSCOPY (EGD) WITH PROPOFOL N/A 03/09/2019   Procedure: ESOPHAGOGASTRODUODENOSCOPY (EGD) WITH PROPOFOL;  Surgeon: Doran Stabler, MD;  [facility];  Service: Gastroenterology;  Laterality: N/A;  . EUS N/A 02/03/2013   Procedure: LOWER ENDOSCOPIC ULTRASOUND (EUS);  Surgeon: Milus Banister, MD;  Location: [facility];  Service: Endoscopy;  Laterality: N/A;  . HEMOSTASIS CLIP PLACEMENT  11/22/2019   Procedure: HEMOSTASIS CLIP PLACEMENT;  Surgeon: Doran Stabler, MD;  [facility];  Service: Gastroenterology;;  . Lucianne Lei CLOSURE N/A 08/24/2013  Procedure: CLOSURE OF LOOP ILEOSTOMY ;  Surgeon: Leighton Ruff, MD;  Location: [facility];  Service: General;  Laterality: N/A;  . IR ANGIO VERTEBRAL SEL SUBCLAVIAN INNOMINATE BILAT MOD SED  06/08/2018  . IR CT HEAD LTD  06/08/2018  . IR INTRAVSC STENT CERV CAROTID W/O EMB-PROT MOD SED INC ANGIO  06/08/2018  . IR PERCUTANEOUS ART THROMBECTOMY/INFUSION INTRACRANIAL INC DIAG ANGIO  06/08/2018  . LAPAROSCOPIC LOW ANTERIOR RESECTION N/A 05/20/2013   Procedure: LAPAROSCOPIC LOW ANTERIOR RESECTION WITH SPLENIC  FLEXURE MOBILIZATION;  Surgeon: Leighton Ruff, MD;  Location: [facility];  Service: General;  Laterality: N/A;  . OSTOMY N/A 05/20/2013   Procedure: diverting OSTOMY;  Surgeon: Leighton Ruff, MD;  Location: [facility];  Service: General;  Laterality: N/A;  . PERIPHERAL VASCULAR INTERVENTION Left 06/14/2018   Procedure: PERIPHERAL VASCULAR INTERVENTION;  Surgeon: Waynetta Sandy, MD;  Location: [facility];  Service: Cardiovascular;  Laterality: Left;  AVF on the Left  . POLYPECTOMY    . POLYPECTOMY  11/22/2019   Procedure: POLYPECTOMY;  Surgeon: Doran Stabler, MD;  Location: [facility];  Service: Gastroenterology;;  . RADIOLOGY WITH ANESTHESIA N/A 06/08/2018   Procedure: IR WITH ANESTHESIA;  Surgeon: Radiologist, Medication, MD;  Location: [facility];  Service: Radiology;  Laterality: N/A;  . REVISON OF ARTERIOVENOUS FISTULA Left 09/98/3382   Procedure: PLICATION/REVISON OF LEFT BRACHIOCEPHALIC ARTERIOVENOUS FISTULA;  Surgeon: Elam Dutch, MD;  Location: [facility];  Service: Vascular;  Laterality: Left;  . UPPER GASTROINTESTINAL ENDOSCOPY    . VIDEO ASSISTED THORACOSCOPY (VATS)/WEDGE RESECTION Left 11/20/2015   Procedure: VIDEO ASSISTED THORACOSCOPY (VATS)/LUNG RESECTION;  Surgeon: [name], MD;  Location: [facility];  Service: Thoracic;  Laterality: Left;  Marland Kitchen VIDEO BRONCHOSCOPY N/A 11/20/2015   Procedure: VIDEO BRONCHOSCOPY;  Surgeon: [name], MD;  Location: [facility];  Service: Thoracic;  Laterality: N/A;       Family History  Problem Relation Age of Onset  . Stroke Mother   . Hypertension Mother   . Stroke Father   . Hypertension Father   . Heart disease Father   . CVA Father   . Hypertension Other   . Colon cancer Neg Hx   . Colon polyps Neg Hx   . Esophageal cancer Neg Hx   . Rectal cancer Neg Hx   . Stomach cancer Neg Hx     Social History   Tobacco Use  . Smoking status: Former Smoker    Packs/day: 0.50    Years: 10.00    Pack years: 5.00    Types:  Cigarettes    Quit date: 07/07/1977    Years since quitting: 43.1  . Smokeless tobacco: Never Used  Vaping Use  . Vaping Use: Never used  Substance Use Topics  . Alcohol use: Not Currently    Alcohol/week: 0.0 standard drinks  . Drug use: No    Home Medications Prior to Admission medications   Medication Sig Start Date End Date Taking? Authorizing Provider  acetaminophen (TYLENOL) 325 MG tablet Take 650 mg by mouth every 4 (four) hours as needed for mild pain or moderate pain.  07/03/18  Yes [provider]  allopurinol (ZYLOPRIM) 100 MG tablet Take 1 tablet (100 mg total) by mouth daily. 04/03/20  Yes [name], MD  atorvastatin (LIPITOR) 20 MG tablet Take 1 tablet (20 mg total) by mouth every evening. 06/12/20  Yes [name], MD  BIDIL 20-37.5 MG tablet TAKE 1 TABLET BY MOUTH THREE TIMES  DAILY Patient taking differently: Take 1 tablet by mouth 3 (three) times daily. 07/18/20  Yes Patwardhan, Manish J, MD  cinacalcet (SENSIPAR) 60 MG tablet Take 60 mg by mouth daily.   Yes [provider]  diphenoxylate-atropine (LOMOTIL) 2.5-0.025 MG tablet Take 1 tablet by mouth 4 (four) times daily. 07/09/20  Yes Danis, Kirke Corin, MD  FOSRENOL 1000 MG PACK Take 1,000 mg by mouth 3 (three) times daily with meals.  12/15/19  Yes [provider]  gabapentin (NEURONTIN) 100 MG capsule TAKE 1 CAPSULE BY MOUTH THREE TIMES DAILY Patient taking differently: Take 100 mg by mouth 3 (three) times daily. 06/18/20  Yes [name], MD  lipase/protease/amylase (CREON) 36000 UNITS CPEP capsule Take 2 capsules (72,000 Units total) by mouth 3 (three) times daily with meals. May also take 1 capsule (36,000 Units total) as needed (with snacks). 12/01/19  Yes Danis, Kirke Corin, MD  metoprolol succinate (TOPROL-XL) 25 MG 24 hr tablet Take 1 tablet (25 mg total) by mouth daily. 04/03/20  Yes [name], MD  ondansetron (ZOFRAN) 4 MG tablet TAKE ONE TABLET BY MOUTH EVERY 8 HOURS AS  NEEDED 08/03/20  Yes Danis, Estill Cotta III, MD  oxyCODONE (OXY IR/ROXICODONE) 5 MG immediate release tablet Take 1 tablet (5 mg total) by mouth every 6 (six) hours as needed for moderate pain. 08/13/20  Yes [name], MD  polycarbophil (FIBERCON) 625 MG tablet Take 625 mg by mouth as needed for diarrhea or loose stools.    Yes [provider]  warfarin (COUMADIN) 5 MG tablet TAKE 1 TABLET BY MOUTH DAILY, EXCEPT 1/2 TABLET ON MONDAY AND FRIDAYS OR TAKE AS DIRECTED BY ANTICOAGULATION CLINIC Patient taking differently: Take 2.5-5 mg by mouth See admin instructions. Take 5 mg on Sun, Tue, Wed, Thurs, and Sat. Take 2.5 mg on Mon and Fri. 04/03/20  Yes [name], MD    Allergies    Nsaids, Lactase, Lactose intolerance (gi), Phentermine, and Amlodipine  Review of Systems   Review of Systems  Constitutional: Negative for fever.  HENT: Negative for sore throat.   Eyes: Negative for visual disturbance.  Respiratory: Positive for shortness of breath. Negative for cough.   Cardiovascular: Negative for chest pain.  Gastrointestinal: Positive for abdominal pain, diarrhea, nausea and vomiting. Negative for hematemesis, hematochezia and melena.  Genitourinary: Negative for dysuria and hematuria.  Musculoskeletal: Negative for neck pain.  Skin: Negative for rash.  Neurological: Negative for headaches.    Physical Exam Updated Vital Signs BP (!) 111/54   Pulse 95   Temp 98.1 F (36.7 C) (Oral)   Resp (!) 8   SpO2 100%   Physical Exam Vitals and nursing note reviewed.  Constitutional:      Appearance: Normal appearance. He is well-developed and well-nourished.  HENT:     Head: Normocephalic and atraumatic.  Eyes:     Conjunctiva/sclera: Conjunctivae normal.  Cardiovascular:     Rate and Rhythm: Regular rhythm. Tachycardia present.     Heart sounds: No murmur heard.   Pulmonary:     Effort: Pulmonary effort is normal. No respiratory distress.     Breath sounds: Normal breath  sounds.  Abdominal:     General: Abdomen is flat.     Palpations: Abdomen is soft.     Tenderness: There is generalized abdominal tenderness. There is no guarding or rebound.  Musculoskeletal:        General: No edema. Normal range of motion.  Cervical back: Neck supple.     Right lower leg: No edema.     Left lower leg: No edema.     Comments: Fistula left upper arm  Skin:    General: Skin is warm and dry.     Capillary Refill: Capillary refill takes less than 2 seconds.  Neurological:     Mental Status: He is alert.  Psychiatric:        Mood and Affect: Mood and affect normal.     ED Results / Procedures / Treatments   Labs (all labs ordered are listed, but only abnormal results are displayed) Labs Reviewed  COMPREHENSIVE METABOLIC PANEL - Abnormal; Notable for the following components:      Result Value   Chloride 92 (*)    BUN 73 (*)    Creatinine, Ser 6.83 (*)    Total Protein 6.2 (*)    Albumin 2.9 (*)    GFR, Estimated 8 (*)    Anion gap 26 (*)    All other components within normal limits  PROTIME-INR - Abnormal; Notable for the following components:   Prothrombin Time 66.9 (*)    INR 8.3 (*)    All other components within normal limits  LACTIC ACID, PLASMA - Abnormal; Notable for the following components:   Lactic Acid, Venous 2.8 (*)    All other components within normal limits  LACTIC ACID, PLASMA - Abnormal; Notable for the following components:   Lactic Acid, Venous 4.1 (*)    All other components within normal limits  CBC WITH DIFFERENTIAL/PLATELET - Abnormal; Notable for the following components:   WBC 25.0 (*)    RBC 2.28 (*)    Hemoglobin 7.0 (*)    HCT 21.3 (*)    RDW 15.9 (*)    Neutro Abs 21.9 (*)    Monocytes Absolute 1.7 (*)    Abs Immature Granulocytes 0.18 (*)    All other components within normal limits  APTT - Abnormal; Notable for the following components:   aPTT 91 (*)    All other components within normal limits  COMPREHENSIVE  METABOLIC PANEL - Abnormal; Notable for the following components:   Chloride 94 (*)    BUN 72 (*)    Creatinine, Ser 6.83 (*)    Total Protein 6.3 (*)    Albumin 3.1 (*)    Total Bilirubin 1.4 (*)    GFR, Estimated 8 (*)    Anion gap 26 (*)    All other components within normal limits  CBC WITH DIFFERENTIAL/PLATELET - Abnormal; Notable for the following components:   WBC 26.2 (*)    RBC 2.36 (*)    Hemoglobin 7.1 (*)    HCT 22.1 (*)    RDW 15.9 (*)    Neutro Abs 23.4 (*)    Monocytes Absolute 1.8 (*)    Abs Immature Granulocytes 0.19 (*)    All other components within normal limits  PROTIME-INR - Abnormal; Notable for the following components:   Prothrombin Time 22.9 (*)    INR 2.1 (*)    All other components within normal limits  HEMOGLOBIN AND HEMATOCRIT, BLOOD - Abnormal; Notable for the following components:   Hemoglobin 7.2 (*)    HCT 21.0 (*)    All other components within normal limits  POC OCCULT BLOOD, ED - Abnormal; Notable for the following components:   Fecal Occult Bld POSITIVE (*)    All other components within normal limits  TROPONIN I (HIGH SENSITIVITY) - Abnormal; Notable  for the following components:   Troponin I (High Sensitivity) 33 (*)    All other components within normal limits  TROPONIN I (HIGH SENSITIVITY) - Abnormal; Notable for the following components:   Troponin I (High Sensitivity) 34 (*)    All other components within normal limits  RESP PANEL BY RT-PCR (FLU A&B, COVID) ARPGX2  CULTURE, BLOOD (SINGLE)  URINE CULTURE  CULTURE, BLOOD (SINGLE)  LIPASE, BLOOD  URINALYSIS, ROUTINE W REFLEX MICROSCOPIC  LACTIC ACID, PLASMA  LACTIC ACID, PLASMA  TYPE AND SCREEN  PREPARE RBC (CROSSMATCH)  PREPARE FRESH FROZEN PLASMA  PREPARE RBC (CROSSMATCH)  PREPARE RBC (CROSSMATCH)  PREPARE FRESH FROZEN PLASMA    EKG EKG Interpretation  Date/Time:  Wednesday August 15 2020 06:55:12 EST Ventricular Rate:  123 PR Interval:    QRS Duration: 82 QT  Interval:  418 QTC Calculation: 598 R Axis:   72 Text Interpretation: Accelerated Junctional rhythm Nonspecific ST and T wave abnormality Abnormal ECG Confirmed by Aletta Edouard [phone]) on 08/15/2020 7:05:58 AM   Radiology DG Chest Port 1 View  Result Date: 08/15/2020 CLINICAL DATA:  Shortness of breath. EXAM: PORTABLE CHEST 1 VIEW COMPARISON:  04/08/2019 FINDINGS: 0748 hours. The lungs are clear without focal pneumonia, edema, pneumothorax or pleural effusion. Skin folds project over each upper hemithorax. The cardiopericardial silhouette is within normal limits for size. Vascular stent device noted left subclavian region. Telemetry leads overlie the chest. IMPRESSION: No active disease. Electronically Signed   By: Misty Stanley M.D.   On: 08/15/2020 08:03    Procedures .Critical Care Performed by: Hayden Rasmussen, MD Authorized by: Hayden Rasmussen, MD   Critical care provider statement:    Critical care time (minutes):  80   Critical care time was exclusive of:  Separately billable procedures and treating other patients   Critical care was necessary to treat or prevent imminent or life-threatening deterioration of the following conditions:  Circulatory failure, sepsis and shock   Critical care was time spent personally by me on the following activities:  Discussions with consultants, evaluation of patient's response to treatment, examination of patient, ordering and performing treatments and interventions, ordering and review of laboratory studies, ordering and review of radiographic studies, pulse oximetry, re-evaluation of patient's condition, obtaining history from patient or surrogate, review of old charts and development of treatment plan with patient or surrogate     Medications Ordered in ED Medications  lactated ringers infusion ( Intravenous New Bag/Given 08/15/20 1342)  sodium chloride flush (NS) 0.9 % injection 3 mL (3 mLs Intravenous Given 08/15/20 1121)  acetaminophen  (TYLENOL) tablet 650 mg (has no administration in time range)    Or  acetaminophen (TYLENOL) suppository 650 mg (has no administration in time range)  ondansetron (ZOFRAN) tablet 4 mg (has no administration in time range)    Or  ondansetron (ZOFRAN) injection 4 mg (has no administration in time range)  pantoprazole (PROTONIX) 80 mg in sodium chloride 0.9 % 100 mL (0.8 mg/mL) infusion (8 mg/hr Intravenous New Bag/Given 08/15/20 1310)  pantoprazole (PROTONIX) injection 40 mg (has no administration in time range)  ceFEPIme (MAXIPIME) 1 g in sodium chloride 0.9 % 100 mL IVPB (has no administration in time range)  metroNIDAZOLE (FLAGYL) IVPB 500 mg (has no administration in time range)  lactated ringers bolus 1,000 mL (0 mLs Intravenous Stopped 08/15/20 0938)  0.9 %  sodium chloride infusion (0 mL/hr Intravenous Stopped 08/15/20 1201)  phytonadione (VITAMIN K) 10 mg in dextrose 5 % 50 mL IVPB (  0 mg Intravenous Stopped 08/15/20 1021)  0.9 %  sodium chloride infusion (0 mL/hr Intravenous Stopped 08/15/20 1238)  pantoprazole (PROTONIX) injection 40 mg (40 mg Intravenous Given 08/15/20 0844)  ceFEPIme (MAXIPIME) 2 g in sodium chloride 0.9 % 100 mL IVPB (0 g Intravenous Stopped 08/15/20 1248)  metroNIDAZOLE (FLAGYL) IVPB 500 mg (0 mg Intravenous Stopped 08/15/20 1424)    ED Course  I have reviewed the triage vital signs and the nursing notes.  Pertinent labs & imaging results that were available during my care of the patient were reviewed by me and considered in my medical decision making (see chart for details).  Clinical Course as of 08/15/20 1427  Wed Aug 15, 2020  0727 Reviewed GI note from Dr. Loletha Carrow.  He states that the patient has had 2 years of diarrhea without clear etiology identified.  Had been controlled with Lomotil.  [name] called him stating that he had diarrhea all afternoon and overnight and was concerned he was dehydrated. [MB]  0732 Echocardiogram 12/23/2019:  Normal LV systolic function  with visual EF 50-55%. Left ventricle cavity  is normal in size. Mild concentric hypertrophy of the left ventricle.  Normal global wall motion. Indeterminate diastolic filling pattern.  Left atrial cavity is severely dilated. The interatrial Septum is thin and  mobile but appears to be intact by 2D and CF Doppler interrogation.  Mild (Grade I) mitral regurgitation.  Mild tricuspid regurgitation.  Mild pulmonic regurgitation.  Compared to previous study 06/2018: LVEF improved from 20-25% to 50-55%.  LAE is now severe and mild MR, TR, and PR are new.  [MB]  8119 CBC coming back with a white count elevated at 25 and hemoglobin low 7.0 down from baseline 11.5.  Rectal exam done with tech as chaperone.  Normal tone.  Sent to lab for guaiac.  Patient is can be typed and crossed and ordered 1 unit packed red blood cells. [MB]  [id number] Chest x-ray interpreted by me as no acute infiltrates. [MB]  1478 Patient is guaiac positive.  Have ordered vitamin K and FFP. [MB]  [id number] Discussed with gastroenterology NP Agustina Caroli who will evaluate. [MB]  [phone] Discussed with Dr. Burnett Sheng from nephrology who will put him on the list. [MB]  2130 Blood and FFP are now going in.  Patient states his symptoms are improved.  Has not had any diarrhea here.  Last blood pressure however over 100.  Tachycardia resolved. [MB]  8657 Discussed with Dr. Tamala Julian from [facility] hospitalist who will evaluate the patient for admission. [MB]  8469 Patient's lactate coming back elevated at 4.1.  Will start empiric antibiotics. [MB]    Clinical Course User Index [MB] Hayden Rasmussen, MD   MDM Rules/Calculators/A&P                         Jason STIMPSON Sr. was evaluated in Emergency Department on 08/15/2020 for the symptoms described in the history of present illness. He was evaluated in the context of the global COVID-19 pandemic, which necessitated consideration that the patient might be at risk for infection with the SARS-CoV-2 virus that  causes COVID-19. Institutional protocols and algorithms that pertain to the evaluation of patients at risk for COVID-19 are in a state of rapid change based on information released by regulatory bodies including the CDC and federal and state organizations. These policies and algorithms were followed during the patient's care in the ED.  This patient complains of abdominal pain  diarrhea weakness hypotension; this involves an extensive number of treatment Options and is a complaint that carries with it a high risk of complications and Morbidity. The differential includes GI bleed, sepsis, shock, perforation, dissection  I ordered, reviewed and interpreted labs, which included CBC with elevated white count and low hemoglobin lower than priors, chemistries fairly normal in the setting of chronic renal failure, coags supratherapeutic with an INR of 8.  Lactic acid elevated.  Troponins elevated but flat reflecting probably demand I ordered medication IV fluids, IV vitamin K, IV packed red blood cells and FFP for anemia and coagulopathy, IV Protonix, IV antibiotics for possible sepsis I ordered imaging studies which included chest x-ray and I independently    visualized and interpreted imaging which showed no acute findings Additional history obtained from patient's wife Previous records obtained and reviewed in epic including prior GI notes I consulted gastroenterology follow-up Renaee Munda, Dr. Jonnie Finner nephrology and [facility] hospitalist Dr. Tamala Julian and discussed lab and imaging findings  Critical Interventions: Work-up and management of patient's acute GI bleed and hypotension with early antibiotics transfusion and correction of coagulopathy  After the interventions stated above, I reevaluated the patient and found patient to be hemodynamically improved.  Blood pressures over 100 and tachycardia resolved.  GI is seen the patient and are hoping to do endoscopy today.  Patient is agreeable to plan for  admission. CHA2DS2/VAS Stroke Risk Points  Current as of 11 minutes ago     6 >= 2 Points: High Risk  1 - 1.99 Points: Medium Risk  0 Points: Low Risk    Last Change: N/A      Details    This score determines the patient's risk of having a stroke if the  patient has atrial fibrillation.       Points Metrics  1 Has Congestive Heart Failure:  Yes    Current as of 11 minutes ago  0 Has Vascular Disease:  No    Current as of 11 minutes ago  1 Has Hypertension:  Yes    Current as of 11 minutes ago  1 Age:  76    Current as of 11 minutes ago  1 Has Diabetes:  Yes     Current as of 11 minutes ago  2 Had Stroke:  Yes  Had TIA:  No  Had Thromboembolism:  No    Current as of 11 minutes ago  0 Male:  No    Current as of 11 minutes ago            Final Clinical Impression(s) / ED Diagnoses Final diagnoses:  Acute GI bleeding  Hypovolemic shock (HCC)  Coagulopathy (HCC)  ESRD on dialysis ([place])  PAF (paroxysmal atrial fibrillation) ([place])    Rx / DC Orders ED Discharge Orders    None       Hayden Rasmussen, MD 08/15/20 1702

## 2020-08-15 NOTE — Anesthesia Postprocedure Evaluation (Signed)
Anesthesia Post Note  Patient: Jason HENGEL Sr.  Procedure(s) Performed: ESOPHAGOGASTRODUODENOSCOPY (EGD) WITH PROPOFOL (N/A ) HEMOSTASIS CLIP PLACEMENT HEMOSTASIS CONTROL     Patient location during evaluation: PACU Anesthesia Type: MAC Level of consciousness: awake and alert Pain management: pain level controlled Vital Signs Assessment: post-procedure vital signs reviewed and stable Respiratory status: spontaneous breathing, nonlabored ventilation, respiratory function stable and patient connected to nasal cannula oxygen Cardiovascular status: stable and blood pressure returned to baseline Postop Assessment: no apparent nausea or vomiting Anesthetic complications: no   No complications documented.  Last Vitals:  Vitals:   08/15/20 1720 08/15/20 1735  BP: (!) 139/42 (!) 139/58  Pulse: 93 97  Resp: 11 12  Temp:  (!) 36.2 C  SpO2: 100% 100%    Last Pain:  Vitals:   08/15/20 1735  TempSrc:   PainSc: Asleep                 Yahir Tavano

## 2020-08-15 NOTE — ED Notes (Signed)
Pt transported to ENDO

## 2020-08-15 NOTE — Progress Notes (Signed)
After 1 unit of FFP and 1 unit of PRBCs hemoglobin 7.2 with INR 2.1.  Patient was ordered an additional 1 unit of FFP and 1 unit of packed red blood cells.  Vital signs still currently stable.

## 2020-08-15 NOTE — Interval H&P Note (Signed)
History and Physical Interval Note: Patient has had more melena today. He received 1 unit RBC and his Hgb remained around 7. His INR went down to 2.1 after 1 unit FFP. He is hemodynamically stable. Given his ongoing bleeding and improvement in INR, I offered him endoscopy this afternoon as he now appears stable for this, hypotension has resolved. I discussed EGD with the patient and his wife at length, risks / benefits of the exam and anesthesia, and they want to proceed. We have given him another unit of FFP and another unit of RBC prior to the procedure. Further recommendations pending the results of this exam. All questions answered.   08/15/2020 4:08 PM  Jason Cotta Sr.  has presented today for surgery, with the diagnosis of upper gi bleed.  The various methods of treatment have been discussed with the patient and family. After consideration of risks, benefits and other options for treatment, the patient has consented to  Procedure(s): ESOPHAGOGASTRODUODENOSCOPY (EGD) WITH PROPOFOL (N/A) as a surgical intervention.  The patient's history has been reviewed, patient examined, no change in status, stable for surgery.  I have reviewed the patient's chart and labs.  Questions were answered to the patient's satisfaction.     Leonidas

## 2020-08-15 NOTE — Op Note (Signed)
[facility] Patient Name: Jason [name] Procedure Date : 08/15/2020 MRN: [id number] Attending MD: Carlota Raspberry. Elaijah Munoz , MD Date of Birth: 1946/06/17 CSN: [id number] Age: 75 Admit Type: Inpatient Procedure:                Upper GI endoscopy Indications:              Suspected upper gastrointestinal bleeding - melena,                            anemia - recent NSAID use, on coumadin with INR >                            8. Received FFP / vitamin K and PRBC, clinically                            much more stable, INR down to 2.1 Providers:                Carlota Raspberry. Havery Moros, MD, Josie Dixon, RN,                            Tyrone Apple, Technician, Lance Coon, CRNA Referring MD:              Medicines:                Monitored Anesthesia Care Complications:            No immediate complications. Estimated blood loss:                            Minimal. Estimated Blood Loss:     Estimated blood loss was minimal. Procedure:                Pre-Anesthesia Assessment:                           - Prior to the procedure, a History and Physical                            was performed, and patient medications and                            allergies were reviewed. The patient's tolerance of                            previous anesthesia was also reviewed. The risks                            and benefits of the procedure and the sedation                            options and risks were discussed with the patient.                            All questions were answered, and informed consent  was obtained. Prior Anticoagulants: The patient has                            taken Coumadin (warfarin), last dose was 1 day                            prior to procedure. ASA Grade Assessment: III - A                            patient with severe systemic disease. After                            reviewing the risks and benefits, the patient was                             deemed in satisfactory condition to undergo the                            procedure.                           After obtaining informed consent, the endoscope was                            passed under direct vision. Throughout the                            procedure, the patient's blood pressure, pulse, and                            oxygen saturations were monitored continuously. The                            GIF-H190 ([id number]) Olympus gastroscope was                            introduced through the mouth, and advanced to the                            second part of duodenum. The upper GI endoscopy was                            accomplished without difficulty. The patient                            tolerated the procedure well. Scope In: Scope Out: Findings:      A 2 cm hiatal hernia was present.      The exam of the esophagus was otherwise normal.      One oozing cratered ulcer was found that extended from within the       pyloric channel and into the proximal duodenal bulb. The lesion was       10-15 mm in largest dimension. On the proximal duodenal side there was a       visible vessel vs. adherent  dark clot from which there was persistent       oozing. I could not visualize underneath this area. For hemostasis, one       hemostatic clip was successfully placed across the base of it with good       result. It was technically quite challenging to get the clip on in this       area given the severe edema within the pyloric channel. The area of the       distal bulb and pylorus was also successfully injected with 6 mL of a       1:10,000 solution of epinephrine for drug delivery. There was no further       bleeding after endoscopic therapy and the area was observed for several       minutes.      Clotted blood was found in the entire examined stomach which was lavaged.      The pyloric channel was extremely edematous and difficult to pleat folds       apart, no  obvious lesions there. The exam of the stomach was otherwise       normal.      Red blood was found in the duodenal bulb oozing from the pyloric channel       ulcer on the proximal duodenal bulb side.      The exam of the duodenum was otherwise normal. Impression:               - 2 cm hiatal hernia.                           - Normal esophagus otherwise                           - Oozing ulcer that extends from within the pyloric                            channel into the proximal duodenal bulb. On the                            duodenal bulb side there was active bleeding from a                            suspected visible vessel. Clip was placed x 1 in                            good position and area was injected with                            epinephrine.                           - Clotted blood in the entire stomach which was                            lavaged.                           - Edematous pyloric channel and prepyloric area  which limited some views but no obvious lesions                            there                           - Normal stomach otherwise                           - Blood in the duodenal bulb coming from the ulcer,                            otherwise normal duodenum. Recommendation:           - Return patient to hospital ward for ongoing care.                           - NPO for tonight, high risk for rebleeding.                           - Continue present medications.                           - Continue IV protonix                           - Trend Hgb post transfusion, monitor for recurrent                            bleeding                           - Trend INR, keep < 2 for now                           - H pylori IgG serology, treat if positive                           - NO NSAIDS (he has been taking them recently)                           - GI service will follow, call with questions Procedure Code(s):        ---  Professional ---                           43255, Esophagogastroduodenoscopy, flexible,                            transoral; with control of bleeding, any method                           43236, 59, Esophagogastroduodenoscopy, flexible,                            transoral; with directed submucosal injection(s),  any substance Diagnosis Code(s):        --- Professional ---                           K44.9, Diaphragmatic hernia without obstruction or                            gangrene                           K25.4, Chronic or unspecified gastric ulcer with                            hemorrhage                           K92.2, Gastrointestinal hemorrhage, unspecified CPT copyright 2019 [employer]. All rights reserved. The codes documented in this report are preliminary and upon coder review may  be revised to meet current compliance requirements. Remo Lipps P. Havery Moros, MD 08/15/2020 5:08:24 PM This report has been signed electronically. Number of Addenda: 0

## 2020-08-15 NOTE — ED Triage Notes (Signed)
Pt states that his stomach hurts since yesterday, epigastric, denies n/v/d

## 2020-08-15 NOTE — Sepsis Progress Note (Signed)
Slight delay in antibiotic administration due to limited IV access and 1u prbc's and 2u FFP being for Gi bleed w/ hypotension

## 2020-08-15 NOTE — Anesthesia Procedure Notes (Addendum)
Procedure Name: MAC Date/Time: 08/15/2020 4:19 PM Performed by: Janace Litten, CRNA Pre-anesthesia Checklist: Patient identified, Emergency Drugs available, Suction available and Patient being monitored Patient Re-evaluated:Patient Re-evaluated prior to induction Oxygen Delivery Method: Nasal cannula

## 2020-08-15 NOTE — ED Notes (Signed)
MD at bedside doing rectal exam

## 2020-08-15 NOTE — Telephone Encounter (Signed)
This is a patient of mine on dialysis who has had diarrhea for over 2 years with extensive unrevealing workup.  Usually under reasonable control with lomotil, but sometimes worsens for unclear reasons.  All began when developed acute on chronic renal failure and started dialysis late 2019.  His wife Jason [name] called 35 AM that Jason [name] has had intractable diarrhea all afternoon and overnight since coming home from yesterday's HD session.  He is weak and she fears he may be dehydrated.  Jason [name] is taking [name] to the [facility] ED for evaluation (and possible admission if necessary to control diarrhea and manage IVF)  Please keep an eye out for that and I will let you know if ED calls me.  - HD

## 2020-08-15 NOTE — Plan of Care (Signed)
  Problem: Clinical Measurements: Goal: Respiratory complications will improve Outcome: Progressing   Problem: Clinical Measurements: Goal: Cardiovascular complication will be avoided Outcome: Progressing   Problem: Coping: Goal: Level of anxiety will decrease Outcome: Progressing   Problem: Pain Managment: Goal: General experience of comfort will improve Outcome: Progressing

## 2020-08-15 NOTE — Anesthesia Preprocedure Evaluation (Addendum)
Anesthesia Evaluation  Patient identified by MRN, date of birth, ID band Patient awake    Reviewed: Allergy & Precautions, NPO status , Patient's Chart, lab work & pertinent test results, reviewed documented beta blocker date and time   Airway Mallampati: II  TM Distance: >3 FB Neck ROM: Full    Dental  (+) Edentulous Upper, Edentulous Lower   Pulmonary sleep apnea and Continuous Positive Airway Pressure Ventilation , COPD, former smoker,  S/p lobectomy  Quit smoking 1979   Pulmonary exam normal breath sounds clear to auscultation       Cardiovascular hypertension, Pt. on home beta blockers and Pt. on medications +CHF  Normal cardiovascular exam+ dysrhythmias (coumadin) Atrial Fibrillation  Rhythm:Regular Rate:Normal     Neuro/Psych CVA    GI/Hepatic Neg liver ROS, Diverticulosis UGIB- no hematemesis, + melena   Endo/Other  negative endocrine ROS  Renal/GU ESRF and DialysisRenal diseaseK 4.7 today  Bladder dysfunction (OAB)      Musculoskeletal  (+) Arthritis , Osteoarthritis,    Abdominal   Peds  Hematology  (+) Blood dyscrasia, anemia , ABLA H/ H 7.2/21 INR initially 8.3, down to 2.1 at 12:45PM  After 1 unit of FFP and 1 unit of PRBCs hemoglobin 7.2 with INR 2.1.  Patient was ordered an additional 1 unit of FFP and 1 unit of packed red blood cells.      Anesthesia Other Findings   Reproductive/Obstetrics negative OB ROS                            Anesthesia Physical Anesthesia Plan  ASA: IV  Anesthesia Plan: MAC   Post-op Pain Management:    Induction:   PONV Risk Score and Plan: Propofol infusion, TIVA and Treatment may vary due to age or medical condition  Airway Management Planned: Natural Airway and Simple Face Mask  Additional Equipment: None  Intra-op Plan:   Post-operative Plan:   Informed Consent: I have reviewed the patients History and Physical, chart, labs  and discussed the procedure including the risks, benefits and alternatives for the proposed anesthesia with the patient or authorized representative who has indicated his/her understanding and acceptance.     Dental advisory given  Plan Discussed with: CRNA  Anesthesia Plan Comments: (Getting 2nd unit of FFP and pRBC currently)        Anesthesia Quick Evaluation

## 2020-08-15 NOTE — H&P (Signed)
History and Physical    Jason KLANG Sr. JQB:[id number] DOB: 1945-07-08 DOA: 08/15/2020  Referring MD/NP/PA: Aletta Edouard, MD PCP: [name], MD  Patient coming from: Home  Chief Complaint:   I have personally briefly reviewed patient's old medical records in [place]   HPI: Jason BLETHEN Sr. is a 75 y.o. male with medical history significant of atrial fibrillation on Coumadin, ESRD on HD (TTS), hypertension, diarrhea, CVA, stage IV rectal cancer s/p chemotherapy, radiation, laparoscopic low anterior resection with diverting loop ileostomy and subsequent ileostomy takedown presented with complaints of abdominal pain and diarrhea.  Patient reported that over the last 4 days he had been having dark stools and intermittent epigastric abdominal pain.  He chronically has diarrhea for which he has been followed by [facility] gastroenterology.  Patient reported associated symptoms of nausea, vomiting, right leg leg swelling, and pain.  Emesis was also noted to be dark in color.  He reports that he had his last episode of diarrhea this morning and has not had any vomiting since yesterday  A little bit over 1 week ago he had been seen by his primary care provider after he accidentally struck a table with his right leg sustaining a wound.  He was started on doxycycline to treat for what was reported to be a cellulitis.  Patient had been taking the medication as prescribed and reports that he had 4-5 more tablets left to take.  Due to the pain he had been taking ibuprofen twice daily.  He is followed up by his primary care provider 2 days ago and x-rays revealed concern for a healing nondisplaced fracture of the mid fibula.    ED Course: upon admission into the emergency department patient was seen to be afebrile, pulse up to 122, blood pressure as low as 79/45 with improvement after fluids, and O2 saturations maintained on room air currently.  Labs significant for WBC 26.2, hemoglobin  7.1(previously 11.7 on 2/4), potassium 4.7, BUN 72, creatinine 6.83, lactic acid 4.1, and INR 8.3.  Stool guaiacs were positive.  Chest x-ray showed no acute disease.  Gastroenterology and nephrology have been formally consulted.  Patient was typed and screened for blood products, ordered 1 unit of packed red blood cells, 1 unit of FFP, vitamin K 10 mg IV, Protonix 40 mg IV, 1 L lactated Ringer's, cefepime, and metronidazole.  TRH called to admit  Review of Systems  Constitutional: Positive for malaise/fatigue. Negative for fever.  HENT: Positive for hearing loss. Negative for ear discharge and nosebleeds.   Eyes: Negative for photophobia and pain.  Respiratory: Negative for cough and shortness of breath.   Cardiovascular: Positive for leg swelling. Negative for chest pain.  Gastrointestinal: Positive for abdominal pain, melena, nausea and vomiting.  Genitourinary: Negative for dysuria and hematuria.  Musculoskeletal: Positive for myalgias.  Neurological: Negative for focal weakness and loss of consciousness.  Psychiatric/Behavioral: Negative for memory loss and substance abuse.    Past Medical History:  Diagnosis Date  . Anemia   . Arthritis   . Atrial fibrillation ([place])    on Coumadin   . Chronic renal insufficiency 07/31/2011  . COPD (chronic obstructive pulmonary disease) ([place])    pt denies  . CVA (cerebral vascular accident) ([place]) 06/2018  . Deficiency anemia 07/20/2012  . Diverticulosis   . Edema 09/21/2010   Qualifier: Diagnosis of  By: Jerold Coombe    . ERECTILE DYSFUNCTION, MILD 05/11/2007   Qualifier: Diagnosis of  By: Sherren Mocha MD,  Jeffrey A   . Exertional dyspnea 10/30/2015  . GOUT 05/11/2007   Qualifier: Diagnosis of  By: Sherren Mocha MD, Jory Ee   . Hereditary and idiopathic peripheral neuropathy 09/14/2014  . History of radiation therapy 02/21/13-03/31/13   rectum 50.4Gy total dose  . Hypersomnia 10/30/2015  . Hypertension   . Lung nodule 10/30/2015  . OAB (overactive bladder)  01/09/2016  . Obesity (BMI 30.0-34.9) 10/30/2015  . Obstructive sleep apnea 07/29/2010   HST 11/2015 AHI 54.  On autocpap  >> 10 cm   . Peripheral edema 09/19/2013  . rectal ca dx'd 02/01/13   rectal. Radiation and chemotherapy- remains on chemotherapy-next tx. 08-22-13.   . S/P partial lobectomy of lung 11/20/2015  . S/P thoracotomy   . Sleep apnea    wears cpap   . SUI (stress urinary incontinence), male 01/09/2016    Past Surgical History:  Procedure Laterality Date  . A/V FISTULAGRAM Left 06/14/2018   Procedure: A/V FISTULAGRAM;  Surgeon: Waynetta Sandy, MD;  Location: [place] [facility];  Service: Cardiovascular;  Laterality: Left;  . AV FISTULA PLACEMENT Left 03/24/2016   Procedure: CREATION OF LEFT BRACIOCEPHALIC ARTERIOVENOUS (AV) FISTULA;  Surgeon: Elam Dutch, MD;  Location: [place];  Service: Vascular;  Laterality: Left;  . BIOPSY  09/17/2018   Procedure: BIOPSY;  Surgeon: Doran Stabler, MD;  Location: [facility];  Service: Gastroenterology;;  . BIOPSY  03/09/2019   Procedure: BIOPSY;  Surgeon: Doran Stabler, MD;  Location: [facility];  Service: Gastroenterology;;  . BIOPSY  11/22/2019   Procedure: BIOPSY;  Surgeon: Doran Stabler, MD;  Location: [facility];  Service: Gastroenterology;;  . COLON SURGERY  05/20/2013  . COLONOSCOPY  2010   [facility]  . COLONOSCOPY WITH PROPOFOL N/A 09/17/2018   Procedure: COLONOSCOPY WITH PROPOFOL;  Surgeon: Doran Stabler, MD;  [facility]: [facility];  Service: Gastroenterology;  Laterality: N/A;  . COLONOSCOPY WITH PROPOFOL N/A 11/22/2019   Procedure: COLONOSCOPY WITH PROPOFOL;  Surgeon: Doran Stabler, MD;  [facility];  Service: Gastroenterology;  Laterality: N/A;  Hx of colon polyps tt   . ESOPHAGOGASTRODUODENOSCOPY (EGD) WITH PROPOFOL N/A 09/17/2018   Procedure: ESOPHAGOGASTRODUODENOSCOPY (EGD) WITH PROPOFOL;  Surgeon: Doran Stabler, MD;  [facility];  Service: Gastroenterology;   Laterality: N/A;  . ESOPHAGOGASTRODUODENOSCOPY (EGD) WITH PROPOFOL N/A 03/09/2019   Procedure: ESOPHAGOGASTRODUODENOSCOPY (EGD) WITH PROPOFOL;  Surgeon: Doran Stabler, MD;  [facility];  Service: Gastroenterology;  Laterality: N/A;  . EUS N/A 02/03/2013   Procedure: LOWER ENDOSCOPIC ULTRASOUND (EUS);  Surgeon: Milus Banister, MD;  Location: [facility];  Service: Endoscopy;  Laterality: N/A;  . HEMOSTASIS CLIP PLACEMENT  11/22/2019   Procedure: HEMOSTASIS CLIP PLACEMENT;  Surgeon: Doran Stabler, MD;  [facility];  Service: Gastroenterology;;  . Lucianne Lei CLOSURE N/A 08/24/2013   Procedure: CLOSURE OF LOOP ILEOSTOMY ;  Surgeon: Leighton Ruff, MD;  [facility];  Service: General;  Laterality: N/A;  . IR ANGIO VERTEBRAL SEL SUBCLAVIAN INNOMINATE BILAT MOD SED  06/08/2018  . IR CT HEAD LTD  06/08/2018  . IR INTRAVSC STENT CERV CAROTID W/O EMB-PROT MOD SED INC ANGIO  06/08/2018  . IR PERCUTANEOUS ART THROMBECTOMY/INFUSION INTRACRANIAL INC DIAG ANGIO  06/08/2018  . LAPAROSCOPIC LOW ANTERIOR RESECTION N/A 05/20/2013   Procedure: LAPAROSCOPIC LOW ANTERIOR RESECTION WITH SPLENIC FLEXURE MOBILIZATION;  Surgeon: Leighton Ruff, MD;  [facility];  Service: General;  Laterality: N/A;  .  OSTOMY N/A 05/20/2013   Procedure: diverting OSTOMY;  Surgeon: Leighton Ruff, MD;  [facility]: [facility];  Service: General;  Laterality: N/A;  . PERIPHERAL VASCULAR INTERVENTION Left 06/14/2018   Procedure: PERIPHERAL VASCULAR INTERVENTION;  Surgeon: Waynetta Sandy, MD;  Location: [place] [facility];  Service: Cardiovascular;  Laterality: Left;  AVF on the Left  . POLYPECTOMY    . POLYPECTOMY  11/22/2019   Procedure: POLYPECTOMY;  Surgeon: Doran Stabler, MD;  Location: [facility];  Service: Gastroenterology;;  . RADIOLOGY WITH ANESTHESIA N/A 06/08/2018   Procedure: IR WITH ANESTHESIA;  Surgeon: Radiologist, Medication, MD;  Location: [facility];  Service: Radiology;  Laterality: N/A;   . REVISON OF ARTERIOVENOUS FISTULA Left 08/65/7846   Procedure: PLICATION/REVISON OF LEFT BRACHIOCEPHALIC ARTERIOVENOUS FISTULA;  Surgeon: Elam Dutch, MD;  Location: [place];  Service: Vascular;  Laterality: Left;  . UPPER GASTROINTESTINAL ENDOSCOPY    . VIDEO ASSISTED THORACOSCOPY (VATS)/WEDGE RESECTION Left 11/20/2015   Procedure: VIDEO ASSISTED THORACOSCOPY (VATS)/LUNG RESECTION;  Surgeon: [name], MD;  Location: [place];  Service: Thoracic;  Laterality: Left;  Marland Kitchen VIDEO BRONCHOSCOPY N/A 11/20/2015   Procedure: VIDEO BRONCHOSCOPY;  Surgeon: [name], MD;  Location: [place];  Service: Thoracic;  Laterality: N/A;     reports that he quit smoking about 43 years ago. His smoking use included cigarettes. He has a 5.00 pack-year smoking history. He has never used smokeless tobacco. He reports previous alcohol use. He reports that he does not use drugs.  Allergies  Allergen Reactions  . Nsaids Other (See Comments)    Asked by surgeon to add this medication class as intolerance due to patients renal insufficiency.  . Lactase Diarrhea  . Lactose Intolerance (Gi) Diarrhea  . Phentermine Other (See Comments)    Unknown reaction  . Amlodipine Other (See Comments)    Edema     Family History  Problem Relation Age of Onset  . Stroke Mother   . Hypertension Mother   . Stroke Father   . Hypertension Father   . Heart disease Father   . CVA Father   . Hypertension Other   . Colon cancer Neg Hx   . Colon polyps Neg Hx   . Esophageal cancer Neg Hx   . Rectal cancer Neg Hx   . Stomach cancer Neg Hx     Prior to Admission medications   Medication Sig Start Date End Date Taking? Authorizing Provider  acetaminophen (TYLENOL) 325 MG tablet Take 650 mg by mouth every 4 (four) hours as needed for mild pain or moderate pain.  07/03/18   [provider]  allopurinol (ZYLOPRIM) 100 MG tablet Take 1 tablet (100 mg total) by mouth daily. 04/03/20   [name], MD   atorvastatin (LIPITOR) 20 MG tablet Take 1 tablet (20 mg total) by mouth every evening. 06/12/20   [name], MD  BIDIL 20-37.5 MG tablet TAKE 1 TABLET BY MOUTH THREE TIMES DAILY 07/18/20   Patwardhan, Reynold Bowen, MD  cinacalcet (SENSIPAR) 60 MG tablet Take 60 mg by mouth daily.    [provider]  diphenoxylate-atropine (LOMOTIL) 2.5-0.025 MG tablet Take 1 tablet by mouth 4 (four) times daily. 07/09/20   Doran Stabler, MD  FOSRENOL 1000 MG PACK Take 1,000 mg by mouth 3 (three) times daily with meals.  12/15/19   [provider]  gabapentin (NEURONTIN) 100 MG capsule TAKE 1 CAPSULE BY MOUTH THREE TIMES DAILY 06/18/20   [name]  L, MD  heparin 1000 unit/mL SOLN injection Heparin Sodium (Porcine) 1,000 Units/mL Catheter Lock Arterial 04/19/20 04/20/21  [provider]  lipase/protease/amylase (CREON) 36000 UNITS CPEP capsule Take 2 capsules (72,000 Units total) by mouth 3 (three) times daily with meals. May also take 1 capsule (36,000 Units total) as needed (with snacks). 12/01/19   Doran Stabler, MD  Methoxy PEG-Epoetin Beta (MIRCERA IJ) Mircera 02/14/20 02/12/21  [provider]  metoprolol succinate (TOPROL-XL) 25 MG 24 hr tablet Take 1 tablet (25 mg total) by mouth daily. 04/03/20   [name], MD  ondansetron (ZOFRAN) 4 MG tablet TAKE ONE TABLET BY MOUTH EVERY 8 HOURS AS NEEDED 08/03/20   Doran Stabler, MD  oxyCODONE (OXY IR/ROXICODONE) 5 MG immediate release tablet Take 1 tablet (5 mg total) by mouth every 6 (six) hours as needed for moderate pain. 08/13/20   [name], MD  polycarbophil (FIBERCON) 625 MG tablet Take 625 mg by mouth as needed for diarrhea or loose stools.     [provider]  warfarin (COUMADIN) 5 MG tablet TAKE 1 TABLET BY MOUTH DAILY, EXCEPT 1/2 TABLET ON MONDAY AND FRIDAYS OR TAKE AS DIRECTED BY ANTICOAGULATION CLINIC Patient taking differently: Take 2.5-5 mg by mouth See admin instructions. Take 5 mg on Sun,  Tue, Wed, Thurs, and Sat. Take 2.5 mg on Mon and Fri. 04/03/20   [name], MD    Physical Exam:  Constitutional: Elderly male who appears to be in no acute distress at this time Vitals:   08/15/20 0952 08/15/20 1000 08/15/20 1015 08/15/20 1025  BP:   (!) 106/56   Pulse:  97  94  Resp: 11 15 13 16   Temp:  98.1 F (36.7 C)  98.2 F (36.8 C)  TempSrc:  Oral  Oral  SpO2:       Eyes: PERRL, lids and conjunctivae normal ENMT: Mucous membranes are dry. Posterior pharynx clear of any exudate or lesions.   Neck: normal, supple, no masses, no thyromegaly Respiratory: clear to auscultation bilaterally, no wheezing, no crackles. Normal respiratory effort. No accessory muscle use.  Cardiovascular: Regular rate and rhythm, no murmurs / rubs / gallops.  Trace edema noted of the right lower extremity. Abdomen: no tenderness, no masses palpated. No hepatosplenomegaly. Bowel sounds positive.  Musculoskeletal: no clubbing / cyanosis. No joint deformity upper and lower extremities. Good ROM, no contractures. Normal muscle tone.  Skin: Abrasions seen without any significant warmth or erythema Neurologic: CN 2-12 grossly intact. Sensation intact, DTR normal. Strength 5/5 in all 4.  Psychiatric: Normal judgment and insight. Alert and oriented x 3. Normal mood.     Labs on Admission: I have personally reviewed following labs and imaging studies  CBC: Recent Labs  Lab 08/10/20 1433 08/15/20 0659 08/15/20 0725  WBC 5.9 25.0* 26.2*  NEUTROABS  --  21.9* 23.4*  HGB 11.7* 7.0* 7.1*  HCT 34.8* 21.3* 22.1*  MCV 92.1 93.4 93.6  PLT 247 308 867   Basic Metabolic Panel: Recent Labs  Lab 08/10/20 1433 08/15/20 0659 08/15/20 0725  NA 138 142 143  K 4.2 4.3 4.7  CL 96* 92* 94*  CO2 25 24 23   GLUCOSE 77 99 91  BUN 32* 73* 72*  CREATININE 6.84* 6.83* 6.83*  CALCIUM 9.7 9.4 9.5   GFR: Estimated Creatinine Clearance: 8.7 mL/min (A) (by C-G formula based on SCr of 6.83 mg/dL (H)). Liver  Function Tests: Recent Labs  Lab 08/15/20 [phone] 08/15/20 0725  AST 28 25  ALT 11 11  ALKPHOS 88 88  BILITOT 1.2 1.4*  PROT 6.2* 6.3*  ALBUMIN 2.9* 3.1*   Recent Labs  Lab 08/15/20 0659  LIPASE 47   No results for input(s): AMMONIA in the last 168 hours. Coagulation Profile: Recent Labs  Lab 08/13/20 0000 08/15/20 0659  INR 4.8* 8.3*   Cardiac Enzymes: No results for input(s): CKTOTAL, CKMB, CKMBINDEX, TROPONINI in the last 168 hours. BNP (last 3 results) No results for input(s): PROBNP in the last 8760 hours. HbA1C: No results for input(s): HGBA1C in the last 72 hours. CBG: No results for input(s): GLUCAP in the last 168 hours. Lipid Profile: No results for input(s): CHOL, HDL, LDLCALC, TRIG, CHOLHDL, LDLDIRECT in the last 72 hours. Thyroid Function Tests: No results for input(s): TSH, T4TOTAL, FREET4, T3FREE, THYROIDAB in the last 72 hours. Anemia Panel: No results for input(s): VITAMINB12, FOLATE, FERRITIN, TIBC, IRON, RETICCTPCT in the last 72 hours. Urine analysis:    Component Value Date/Time   COLORURINE YELLOW 06/09/2018 0418   APPEARANCEUR CLEAR 06/09/2018 0418   LABSPEC 1.029 06/09/2018 0418   PHURINE 5.0 06/09/2018 0418   GLUCOSEU NEGATIVE 06/09/2018 0418   GLUCOSEU NEGATIVE 10/14/2016 1144   HGBUR SMALL (A) 06/09/2018 0418   HGBUR negative 05/29/2010 0952   BILIRUBINUR Negative 01/16/2020 1608   KETONESUR NEGATIVE 06/09/2018 0418   PROTEINUR Positive (A) 01/16/2020 1608   PROTEINUR 100 (A) 06/09/2018 0418   UROBILINOGEN 0.2 01/16/2020 1608   UROBILINOGEN 0.2 10/14/2016 1144   NITRITE Negative 01/16/2020 1608   NITRITE NEGATIVE 06/09/2018 0418   LEUKOCYTESUR Large (3+) (A) 01/16/2020 1608   Sepsis Labs: Recent Results (from the past 240 hour(s))  Resp Panel by RT-PCR (Flu A&B, Covid) Nasopharyngeal Swab     Status: None   Collection Time: 08/15/20  7:18 AM   Specimen: Nasopharyngeal Swab; Nasopharyngeal(NP) swabs in vial transport medium   Result Value Ref Range Status   SARS Coronavirus 2 by RT PCR NEGATIVE NEGATIVE Final    Comment: (NOTE) SARS-CoV-2 target nucleic acids are NOT DETECTED.  The SARS-CoV-2 RNA is generally detectable in upper respiratory specimens during the acute phase of infection. The lowest concentration of SARS-CoV-2 viral copies this assay can detect is 138 copies/mL. A negative result does not preclude SARS-Cov-2 infection and should not be used as the sole basis for treatment or other patient management decisions. A negative result may occur with  improper specimen collection/handling, submission of specimen other than nasopharyngeal swab, presence of viral mutation(s) within the areas targeted by this assay, and inadequate number of viral copies(<138 copies/mL). A negative result must be combined with clinical observations, patient history, and epidemiological information. The expected result is Negative.  Fact Sheet for Patients:  EntrepreneurPulse.com.au  Fact Sheet for Healthcare Providers:  IncredibleEmployment.be  This test is no t yet approved or cleared by the [place] FDA and  has been authorized for detection and/or diagnosis of SARS-CoV-2 by FDA under an Emergency Use Authorization (EUA). This EUA will remain  in effect (meaning this test can be used) for the duration of the COVID-19 declaration under Section 564(b)(1) of the Act, 21 U.S.C.section 360bbb-3(b)(1), unless the authorization is terminated  or revoked sooner.       Influenza A by PCR NEGATIVE NEGATIVE Final   Influenza B by PCR NEGATIVE NEGATIVE Final    Comment: (NOTE) The Xpert Xpress SARS-CoV-2/FLU/RSV plus assay is intended as an aid in the diagnosis of influenza from Nasopharyngeal swab specimens and should  not be used as a sole basis for treatment. Nasal washings and aspirates are unacceptable for Xpert Xpress SARS-CoV-2/FLU/RSV testing.  Fact Sheet for  Patients: EntrepreneurPulse.com.au  Fact Sheet for Healthcare Providers: IncredibleEmployment.be  This test is not yet approved or cleared by the [place] FDA and has been authorized for detection and/or diagnosis of SARS-CoV-2 by FDA under an Emergency Use Authorization (EUA). This EUA will remain in effect (meaning this test can be used) for the duration of the COVID-19 declaration under Section 564(b)(1) of the Act, 21 U.S.C. section 360bbb-3(b)(1), unless the authorization is terminated or revoked.  Performed at [facility], [place] [address] [place], [place] [address]      Radiological Exams on Admission: DG Chest Port 1 View  Result Date: 08/15/2020 CLINICAL DATA:  Shortness of breath. EXAM: PORTABLE CHEST 1 VIEW COMPARISON:  04/08/2019 FINDINGS: 0748 hours. The lungs are clear without focal pneumonia, edema, pneumothorax or pleural effusion. Skin folds project over each upper hemithorax. The cardiopericardial silhouette is within normal limits for size. Vascular stent device noted left subclavian region. Telemetry leads overlie the chest. IMPRESSION: No active disease. Electronically Signed   By: Misty Stanley M.D.   On: 08/15/2020 08:03    EKG: Independently reviewed.  Accelerated junctional rhythm 123 bpm  Assessment/Plan Acute blood loss anemia secondary to GI bleed: Patient presents with complaints of dark stools for the last 4 days.  Stool guaiacs were noted to be positive and hemoglobin acutely dropped from 11.7 g/dL down to 7.1 g/dL today.  Patient had been taking ibuprofen for pain of the right lower extremity labs significant for elevated BUN 72 to give concern for upper GI bleed.  He had initially been given 40 mg of Protonix IV.  [facility] have been formally consulted and started Protonix drip. -Admit to a progressive bed -Aspiration precautions with elevation in bed -N.p.o. -Continue Protonix drip -Serial monitoring of H&H  we will transfuse blood products as needed to maintain hemoglobin greater than 8 g/dL -Appreciate GI consultative services, we will follow for further recommendation  Transient hypotension: Acute.  On admission blood pressure as low as 64/45 with improvement after initial fluid boluses.  Suspected secondary to acute blood loss.  He appears to be hemodynamically stable at this time. -Continue IV fluids at 50 mL/h  Paroxysmal atrial fibrillation with supratherapeutic INR: On admission INR elevated at 8.3. He had been recently on antibiotics to treat for a right leg cellulitis which likely resulted in elevated INR.  Due to acute bleed patient was given 1 unit of FFP and 10 mg of vitamin K IV. -Recheck PT/INR -Will give additional FFP if needed to correct INR  SIRS lactic acid: Acute.  On admission patient noted to be tachycardic with initial WBC elevated at 26.2 and lactic acid initially 4.1.  Suspect symptoms could be likely related to acute blood loss.  He had been started on empiric antibiotics of metronidazole and cefepime as it was unclear if there is any concern for infection.  Blood cultures had not been obtained prior to him receiving blood products. -Patient has been ordered to be transfused 1 unit of packed red blood cells -Follow-up urinalysis -Continue to trend lactic acid -Continue metronidazole and cefepime, but consider discontinuing as symptoms could all be related to acute shock  Nondisplaced right fibula fracture: Patient reporting right leg swelling after accidentally hitting his leg against a table home almost 2 weeks ago.  He had been treated for concern for cellulitis with  doxycycline.  X-rays from 2/7 revealed the likely fracture.  No significant erythema appreciated at this time patient's primary care provider had placed a referral to orthopedics. -Cam boot ordered with weightbearing as tolerated  Diarrhea: Acute on chronic.  Patient reported that his last bowel movement was  prior to coming in yesterday, but have been on antibiotics recently.  He notes that he has had issues with diarrhea ever since his prior surgery for the rectal cancer in 2014 -Check C. difficile if diarrhea persist given recent antibiotics   History of rectal cancer: Patient initially diagnosed in 2014 with rectal cancer s/p chemotherapy, radiation, laparoscopic low anterior resection with diverting loop ileostomy and subsequent ileostomy takedown.  Pathology revealed invasive adenocarcinoma.  Ever since that surgery patient reports that he has had diarrhea  ESRD on HD: Patient normally dialyzes Tuesday, Thursday, and Saturday.  Patient had received a full hemodialysis treatment yesterday.  Chest x-ray was otherwise noted to be clear.  Labs significant for potassium 4.7, BUN 72, creatinine 6.83 -Nephrology consulted, will follow-up for further recommendation  Heart failure with preserved EF: Last EF noted to be 50-55% based off echocardiogram from 12/23/2019 with mild mitral regurgitation, tricuspid regurgitation, and pulmonic regurgitation.  This was improved from last EF of 20- 25% based off echocardiogram from 06/2018. -Strict I&Os and daily weights   DVT prophylaxis: SCD Code Status: Full Family Communication: Wife updated at bedside Disposition Plan: Hopefully discharge home once medically stable Consults called: GI and nephrology Admission status: Inpatient status, require more than 2 midnight stay given acute blood loss  Norval Morton MD [facility]   If 7PM-7AM, please contact night-coverage   08/15/2020, 10:26 AM

## 2020-08-15 NOTE — Transfer of Care (Signed)
Immediate Anesthesia Transfer of Care Note  Patient: Jason RUHLMAN Sr.  Procedure(s) Performed: ESOPHAGOGASTRODUODENOSCOPY (EGD) WITH PROPOFOL (N/A ) HEMOSTASIS CLIP PLACEMENT HEMOSTASIS CONTROL  Patient Location: PACU  Anesthesia Type:MAC  Level of Consciousness: drowsy, patient cooperative and responds to stimulation  Airway & Oxygen Therapy: Patient Spontanous Breathing and Patient connected to nasal cannula oxygen  Post-op Assessment: Report given to RN and Post -op Vital signs reviewed and stable  Post vital signs: Reviewed and stable  Last Vitals:  Vitals Value Taken Time  BP 113/42 08/15/20 1705  Temp 36.3 C 08/15/20 1705  Pulse 105 08/15/20 1705  Resp 14 08/15/20 1709  SpO2 92 % 08/15/20 1704  Vitals shown include unvalidated device data.  Last Pain:  Vitals:   08/15/20 1705  TempSrc:   PainSc: Asleep         Complications: No complications documented.

## 2020-08-15 NOTE — Consult Note (Signed)
Referring Provider:  [facility]         Primary Care Physician:  [name], MD Primary Gastroenterologist:  Wilfrid Lund, MD            We were asked to see this patient for:      Diarrhea, anemia, heme+ stool            ASSESSMENT / PLAN:    # GI bleed with shock. Presumably hemorrhagic shock secondary to upper bleed with in setting of supratherapeutic INR of 8.3. Dark liquid stool on DRE. Acute drop in hgb from baseline of mid 11 to 7.1. He has been taking Ibuprofen for the last 5 days. Rule out PUD. --Getting FFP, Vitamin K 10 mg IV --Will need EGD with INR improves. In interim, PPI infusion, supportive care. Tachycardia and hypotension improving, s/p liter of IVF  --Blood transfusion already in progress.  --NPO for now, will consider sips of clears later depending on clinical course  # Atrial fibrillation, on Coumadin, supratherapeutic INR of 8.3  # Chronic diarrhea, worse since yesterday but I think it is because he is having active GI bleed. Don't suspect infectious etiology at this point.   # ESRD on HD Tu, Th, Sat  # Other medical problems : Hx of CVA, OSA, HTN, COPD  # History of rectal cancer, status post laparoscopic LAR with diverting loop ileostomy 05/23/2013. Final pathology showed a 3.2 cm invasive adenocarcinoma with neoadjuvant related change. Tumor invaded into perirectal soft tissue. There was no lymphovascular or perineural invasion. 14 lymph nodes were negative for tumor. Surgical margins were negative. A 12 mm tubular adenoma without HGD was removed at time of last colonoscopy in May 2021     HPI:                                                                                                                             Chief Complaint:  Diarrhea and abdominal pain   Jason LAPINSKY Sr. is a 75 y.o. male followed by Dr. Loletha Carrow for chronic diarrhea and colon polyps. Cause of diarrhea never clearly determined despite extensive work-up and empiric  treatment for C. difficile colitis and pancreatic insufficiency.  He has also tried Viberzi and been treated with Xifaxan.  Patient was last seen September 2021  Patient presented to ED this am with abdominal pain and diarrhea. Patient has chronic, diarrhea which became acutely worse yesterday. Also he describes black stool. He starting taking Ibuprofen on Friday for leg pain. The abdominal pain is intermittent, achy, non-radiating. He has had associated nausea / vomiting and describes dark emesis yesterday.  In ED he was shocky with tachycardia.  Hgb 7, down from baseline of mid 11 just a few days ago. Has ESRD, on HD but BUN markedly elevated at 72. His lactic acid level is high at 4.1, Troponin high at 33.     PREVIOUS ENDOSCOPIC EVALUATIONS / [place] STUDIES   May  2021 Colonoscopy for evaluation of diarrhea and removal of polyps not previously removed since patient was on brillinta  Preparation of the colon was fair. - Rectal stricture found on digital rectal exam. - Diverticulosis in the left colon and in the right colon. - Patent end-to-end colo-rectal anastomosis, characterized by healthy appearing mucosa. - One 12 mm polyp in the mid ascending colon, removed piecemeal using a hot snare. Resected and retrieved. Clips (MR conditional) were placed. - Normal mucosa in the entire examined colon. Biopsied. Clips (MR conditional) were placed. - The examination was otherwise normal  FINAL MICROSCOPIC DIAGNOSIS:   A. COLON, ASCENDING, POLYPECTOMY:  - Tubular adenoma(s).  - High grade dysplasia is not identified.   B. COLON, RANDOM, BIOPSY:  - Essentially unremarkable colorectal type mucosa.  - There is no evidence of significant inflammation, dysplasia, or  malignancy.   Sept 2020 EGD for evaluation of duodenal wall thickening on CT scan and diarrhea Normal esophagus. - Normal stomach. - Normal examined duodenum. Biopsied.  Path = peptic duodenitis  Past Medical History:   Diagnosis Date  . Anemia   . Arthritis   . Atrial fibrillation ([place])    on Coumadin   . Chronic renal insufficiency 07/31/2011  . COPD (chronic obstructive pulmonary disease) ([place])    pt denies  . CVA (cerebral vascular accident) ([place]) 06/2018  . Deficiency anemia 07/20/2012  . Diverticulosis   . Edema 09/21/2010   Qualifier: Diagnosis of  By: Jerold Coombe    . ERECTILE DYSFUNCTION, MILD 05/11/2007   Qualifier: Diagnosis of  By: Sherren Mocha MD, Jory Ee   . Exertional dyspnea 10/30/2015  . GOUT 05/11/2007   Qualifier: Diagnosis of  By: Sherren Mocha MD, Jory Ee   . Hereditary and idiopathic peripheral neuropathy 09/14/2014  . History of radiation therapy 02/21/13-03/31/13   rectum 50.4Gy total dose  . Hypersomnia 10/30/2015  . Hypertension   . Lung nodule 10/30/2015  . OAB (overactive bladder) 01/09/2016  . Obesity (BMI 30.0-34.9) 10/30/2015  . Obstructive sleep apnea 07/29/2010   HST 11/2015 AHI 54.  On autocpap  >> 10 cm   . Peripheral edema 09/19/2013  . rectal ca dx'd 02/01/13   rectal. Radiation and chemotherapy- remains on chemotherapy-next tx. 08-22-13.   . S/P partial lobectomy of lung 11/20/2015  . S/P thoracotomy   . Sleep apnea    wears cpap   . SUI (stress urinary incontinence), male 01/09/2016    Past Surgical History:  Procedure Laterality Date  . A/V FISTULAGRAM Left 06/14/2018   Procedure: A/V FISTULAGRAM;  Surgeon: Waynetta Sandy, MD;  Location: [facility];  Service: Cardiovascular;  Laterality: Left;  . AV FISTULA PLACEMENT Left 03/24/2016   Procedure: CREATION OF LEFT BRACIOCEPHALIC ARTERIOVENOUS (AV) FISTULA;  Surgeon: Elam Dutch, MD;  Location: [facility];  Service: Vascular;  Laterality: Left;  . BIOPSY  09/17/2018   Procedure: BIOPSY;  Surgeon: Doran Stabler, MD;  Location: [facility];  Service: Gastroenterology;;  . BIOPSY  03/09/2019   Procedure: BIOPSY;  Surgeon: Doran Stabler, MD;  Location: [facility];  Service: Gastroenterology;;  . BIOPSY   11/22/2019   Procedure: BIOPSY;  Surgeon: Doran Stabler, MD;  Location: [facility];  Service: Gastroenterology;;  . COLON SURGERY  05/20/2013  . COLONOSCOPY  2010   [facility]  . COLONOSCOPY WITH PROPOFOL N/A 09/17/2018   Procedure: COLONOSCOPY WITH PROPOFOL;  Surgeon: Doran Stabler, MD;  Location: [facility];  Service: Gastroenterology;  Laterality: N/A;  . COLONOSCOPY WITH PROPOFOL N/A 11/22/2019   Procedure: COLONOSCOPY WITH PROPOFOL;  Surgeon: Doran Stabler, MD;  [facility]: [facility];  Service: Gastroenterology;  Laterality: N/A;  Hx of colon polyps tt   . ESOPHAGOGASTRODUODENOSCOPY (EGD) WITH PROPOFOL N/A 09/17/2018   Procedure: ESOPHAGOGASTRODUODENOSCOPY (EGD) WITH PROPOFOL;  Surgeon: Doran Stabler, MD;  [facility];  Service: Gastroenterology;  Laterality: N/A;  . ESOPHAGOGASTRODUODENOSCOPY (EGD) WITH PROPOFOL N/A 03/09/2019   Procedure: ESOPHAGOGASTRODUODENOSCOPY (EGD) WITH PROPOFOL;  Surgeon: Doran Stabler, MD;  [facility];  Service: Gastroenterology;  Laterality: N/A;  . EUS N/A 02/03/2013   Procedure: LOWER ENDOSCOPIC ULTRASOUND (EUS);  Surgeon: Milus Banister, MD;  Location: [facility];  Service: Endoscopy;  Laterality: N/A;  . HEMOSTASIS CLIP PLACEMENT  11/22/2019   Procedure: HEMOSTASIS CLIP PLACEMENT;  Surgeon: Doran Stabler, MD;  [facility];  Service: Gastroenterology;;  . Lucianne Lei CLOSURE N/A 08/24/2013   Procedure: CLOSURE OF LOOP ILEOSTOMY ;  Surgeon: Leighton Ruff, MD;  Location: [facility];  Service: General;  Laterality: N/A;  . IR ANGIO VERTEBRAL SEL SUBCLAVIAN INNOMINATE BILAT MOD SED  06/08/2018  . IR CT HEAD LTD  06/08/2018  . IR INTRAVSC STENT CERV CAROTID W/O EMB-PROT MOD SED INC ANGIO  06/08/2018  . IR PERCUTANEOUS ART THROMBECTOMY/INFUSION INTRACRANIAL INC DIAG ANGIO  06/08/2018  . LAPAROSCOPIC LOW ANTERIOR RESECTION N/A 05/20/2013   Procedure: LAPAROSCOPIC LOW ANTERIOR RESECTION WITH SPLENIC FLEXURE  MOBILIZATION;  Surgeon: Leighton Ruff, MD;  Location: [facility];  Service: General;  Laterality: N/A;  . OSTOMY N/A 05/20/2013   Procedure: diverting OSTOMY;  Surgeon: Leighton Ruff, MD;  Location: [facility];  Service: General;  Laterality: N/A;  . PERIPHERAL VASCULAR INTERVENTION Left 06/14/2018   Procedure: PERIPHERAL VASCULAR INTERVENTION;  Surgeon: Waynetta Sandy, MD;  Location: [facility];  Service: Cardiovascular;  Laterality: Left;  AVF on the Left  . POLYPECTOMY    . POLYPECTOMY  11/22/2019   Procedure: POLYPECTOMY;  Surgeon: Doran Stabler, MD;  Location: [facility];  Service: Gastroenterology;;  . RADIOLOGY WITH ANESTHESIA N/A 06/08/2018   Procedure: IR WITH ANESTHESIA;  Surgeon: Radiologist, Medication, MD;  Location: [facility];  Service: Radiology;  Laterality: N/A;  . REVISON OF ARTERIOVENOUS FISTULA Left [id number]   Procedure: PLICATION/REVISON OF LEFT BRACHIOCEPHALIC ARTERIOVENOUS FISTULA;  Surgeon: Elam Dutch, MD;  Location: [facility];  Service: Vascular;  Laterality: Left;  . UPPER GASTROINTESTINAL ENDOSCOPY    . VIDEO ASSISTED THORACOSCOPY (VATS)/WEDGE RESECTION Left 11/20/2015   Procedure: VIDEO ASSISTED THORACOSCOPY (VATS)/LUNG RESECTION;  Surgeon: [name], MD;  Location: [facility];  Service: Thoracic;  Laterality: Left;  Marland Kitchen VIDEO BRONCHOSCOPY N/A 11/20/2015   Procedure: VIDEO BRONCHOSCOPY;  Surgeon: [name], MD;  Location: [facility];  Service: Thoracic;  Laterality: N/A;    Prior to Admission medications   Medication Sig Start Date End Date Taking? Authorizing Provider  acetaminophen (TYLENOL) 325 MG tablet Take 650 mg by mouth every 4 (four) hours as needed for mild pain or moderate pain.  07/03/18   [provider]  allopurinol (ZYLOPRIM) 100 MG tablet Take 1 tablet (100 mg total) by mouth daily. 04/03/20   [name], MD  atorvastatin (LIPITOR) 20 MG tablet Take 1 tablet (20 mg total) by mouth every evening. 06/12/20   [name], MD  BIDIL 20-37.5 MG tablet TAKE 1 TABLET BY MOUTH THREE TIMES DAILY 07/18/20   Patwardhan, Reynold Bowen,  MD  cinacalcet (SENSIPAR) 60 MG tablet Take 60 mg by mouth daily.    [provider]  diphenoxylate-atropine (LOMOTIL) 2.5-0.025 MG tablet Take 1 tablet by mouth 4 (four) times daily. 07/09/20   Doran Stabler, MD  FOSRENOL 1000 MG PACK Take 1,000 mg by mouth 3 (three) times daily with meals.  12/15/19   [provider]  gabapentin (NEURONTIN) 100 MG capsule TAKE 1 CAPSULE BY MOUTH THREE TIMES DAILY 06/18/20   [name], MD  heparin 1000 unit/mL SOLN injection Heparin Sodium (Porcine) 1,000 Units/mL Catheter Lock Arterial 04/19/20 04/20/21  [provider]  lipase/protease/amylase (CREON) 36000 UNITS CPEP capsule Take 2 capsules (72,000 Units total) by mouth 3 (three) times daily with meals. May also take 1 capsule (36,000 Units total) as needed (with snacks). 12/01/19   Doran Stabler, MD  Methoxy PEG-Epoetin Beta (MIRCERA IJ) Mircera 02/14/20 02/12/21  [provider]  metoprolol succinate (TOPROL-XL) 25 MG 24 hr tablet Take 1 tablet (25 mg total) by mouth daily. 04/03/20   [name], MD  ondansetron (ZOFRAN) 4 MG tablet TAKE ONE TABLET BY MOUTH EVERY 8 HOURS AS NEEDED 08/03/20   Doran Stabler, MD  oxyCODONE (OXY IR/ROXICODONE) 5 MG immediate release tablet Take 1 tablet (5 mg total) by mouth every 6 (six) hours as needed for moderate pain. 08/13/20   [name], MD  polycarbophil (FIBERCON) 625 MG tablet Take 625 mg by mouth as needed for diarrhea or loose stools.     [provider]  warfarin (COUMADIN) 5 MG tablet TAKE 1 TABLET BY MOUTH DAILY, EXCEPT 1/2 TABLET ON MONDAY AND FRIDAYS OR TAKE AS DIRECTED BY ANTICOAGULATION CLINIC Patient taking differently: Take 2.5-5 mg by mouth See admin instructions. Take 5 mg on Sun, Tue, Wed, Thurs, and Sat. Take 2.5 mg on Mon and Fri. 04/03/20   [name], MD    No current  facility-administered medications for this encounter.   Current Outpatient Medications  Medication Sig Dispense Refill  . acetaminophen (TYLENOL) 325 MG tablet Take 650 mg by mouth every 4 (four) hours as needed for mild pain or moderate pain.     Marland Kitchen allopurinol (ZYLOPRIM) 100 MG tablet Take 1 tablet (100 mg total) by mouth daily. 90 tablet 1  . atorvastatin (LIPITOR) 20 MG tablet Take 1 tablet (20 mg total) by mouth every evening. 90 tablet 1  . BIDIL 20-37.5 MG tablet TAKE 1 TABLET BY MOUTH THREE TIMES DAILY 90 tablet 0  . cinacalcet (SENSIPAR) 60 MG tablet Take 60 mg by mouth daily.    . diphenoxylate-atropine (LOMOTIL) 2.5-0.025 MG tablet Take 1 tablet by mouth 4 (four) times daily. 120 tablet 4  . FOSRENOL 1000 MG PACK Take 1,000 mg by mouth 3 (three) times daily with meals.     . gabapentin (NEURONTIN) 100 MG capsule TAKE 1 CAPSULE BY MOUTH THREE TIMES DAILY 90 capsule 5  . heparin 1000 unit/mL SOLN injection Heparin Sodium (Porcine) 1,000 Units/mL Catheter Lock Arterial    . lipase/protease/amylase (CREON) 36000 UNITS CPEP capsule Take 2 capsules (72,000 Units total) by mouth 3 (three) times daily with meals. May also take 1 capsule (36,000 Units total) as needed (with snacks). 240 capsule 11  . Methoxy PEG-Epoetin Beta (MIRCERA IJ) Mircera    . metoprolol succinate (TOPROL-XL) 25 MG 24 hr tablet Take 1 tablet (25 mg total) by mouth daily. 90 tablet 1  . ondansetron (ZOFRAN) 4 MG tablet TAKE ONE TABLET  BY MOUTH EVERY 8 HOURS AS NEEDED 45 tablet 1  . oxyCODONE (OXY IR/ROXICODONE) 5 MG immediate release tablet Take 1 tablet (5 mg total) by mouth every 6 (six) hours as needed for moderate pain. 30 tablet 0  . polycarbophil (FIBERCON) 625 MG tablet Take 625 mg by mouth as needed for diarrhea or loose stools.     . warfarin (COUMADIN) 5 MG tablet TAKE 1 TABLET BY MOUTH DAILY, EXCEPT 1/2 TABLET ON MONDAY AND FRIDAYS OR TAKE AS DIRECTED BY ANTICOAGULATION CLINIC (Patient taking differently: Take  2.5-5 mg by mouth See admin instructions. Take 5 mg on Sun, Tue, Wed, Thurs, and Sat. Take 2.5 mg on Mon and Fri.) 90 tablet 1    Allergies as of 08/15/2020 - Review Complete 08/15/2020  Allergen Reaction Noted  . Nsaids Other (See Comments) 05/20/2013  . Lactase Diarrhea 03/30/2019  . Lactose intolerance (gi) Diarrhea 06/17/2018  . Phentermine Other (See Comments) 03/30/2019  . Amlodipine Other (See Comments) 10/02/2015    Family History  Problem Relation Age of Onset  . Stroke Mother   . Hypertension Mother   . Stroke Father   . Hypertension Father   . Heart disease Father   . CVA Father   . Hypertension Other   . Colon cancer Neg Hx   . Colon polyps Neg Hx   . Esophageal cancer Neg Hx   . Rectal cancer Neg Hx   . Stomach cancer Neg Hx     Social History   Socioeconomic History  . Marital status: Married    Spouse name: [name]  . Number of children: 2  . Years of education: 64  . Highest education level: Not on file  Occupational History    Comment: retired Insurance account manager  Tobacco Use  . Smoking status: Former Smoker    Packs/day: 0.50    Years: 10.00    Pack years: 5.00    Types: Cigarettes    Quit date: 07/07/1977    Years since quitting: 43.1  . Smokeless tobacco: Never Used  Vaping Use  . Vaping Use: Never used  Substance and Sexual Activity  . Alcohol use: Not Currently    Alcohol/week: 0.0 standard drinks  . Drug use: No  . Sexual activity: Not Currently  Other Topics Concern  . Not on file  Social History Narrative   Married to wife, [name]   Retired Insurance account manager w/[employer]   #2 grown children and #6 grandchildren   Social Determinants of Health   Financial Resource Strain: [place]   . Difficulty of Paying Living Expenses: Not hard at all  Food Insecurity: No Food Insecurity  . Worried About Charity fundraiser in the Last Year: Never true  . Ran Out of Food in the Last Year: Never true  Transportation Needs: No  Transportation Needs  . Lack of Transportation (Medical): No  . Lack of Transportation (Non-Medical): No  Physical Activity: Sufficiently Active  . Days of Exercise per Week: 5 days  . Minutes of Exercise per Session: 30 min  Stress: No Stress Concern Present  . Feeling of Stress : Not at all  Social Connections: Socially Integrated  . Frequency of Communication with Friends and Family: More than three times a week  . Frequency of Social Gatherings with Friends and Family: More than three times a week  . Attends Religious Services: More than 4 times per year  . Active Member of Clubs or Organizations: Not on  file  . Attends Club or Organization Meetings: More than 4 times per year  . Marital Status: Married  Human resources officer Violence: Not on file    Review of Systems: All systems reviewed and negative except where noted in HPI.  OBJECTIVE:    Physical Exam: Vital signs in last 24 hours: Temp:  [97.4 F (36.3 C)-98.2 F (36.8 C)] 98.2 F (36.8 C) (02/09 1025) Pulse Rate:  [94-122] 94 (02/09 1025) Resp:  [11-16] 16 (02/09 1025) BP: (79-106)/(45-63) 106/56 (02/09 1015) SpO2:  [95 %] 95 % (02/09 0656)   General:   Alert thin male in NAD Psych:  Pleasant, cooperative. Normal mood and affect. Eyes:  Pupils equal, sclera clear, no icterus.   Conjunctiva pink. Ears:  Normal auditory acuity. Nose:  No deformity, discharge,  or lesions. Neck:  Supple; no masses Lungs:  Clear throughout to auscultation.   No wheezes, crackles, or rhonchi.  Heart:  Regular rate and rhythm; 1-2+ RLE edema Abdomen:  Soft, non-distended, nontender, BS active, no palp mass   Rectal:  Rectal stricture. Dark liquid stool in vault.   Msk:  Symmetrical without gross deformities. . Neurologic:  Alert and  oriented x4;  grossly normal neurologically. Skin:  Intact without significant lesions or rashes.   Intake/Output from previous day: No intake/output data recorded. Intake/Output this shift: No  intake/output data recorded.   Lab Results: Recent Labs    08/15/20 0659 08/15/20 0725  WBC 25.0* 26.2*  HGB 7.0* 7.1*  HCT 21.3* 22.1*  PLT 308 258   BMET Recent Labs    08/15/20 0659 08/15/20 0725  NA 142 143  K 4.3 4.7  CL 92* 94*  CO2 24 23  GLUCOSE 99 91  BUN 73* 72*  CREATININE 6.83* 6.83*  CALCIUM 9.4 9.5   LFT Recent Labs    08/15/20 0725  PROT 6.3*  ALBUMIN 3.1*  AST 25  ALT 11  ALKPHOS 88  BILITOT 1.4*   PT/INR Recent Labs    08/13/20 0000 08/15/20 0659  LABPROT  --  66.9*  INR 4.8* 8.3*   Hepatitis Panel No results for input(s): HEPBSAG, HCVAB, HEPAIGM, HEPBIGM in the last 72 hours.   . CBC Latest Ref Rng & Units 08/15/2020 08/15/2020 08/10/2020  WBC 4.0 - 10.5 K/uL 26.2(H) 25.0(H) 5.9  Hemoglobin 13.0 - 17.0 g/dL 7.1(L) 7.0(L) 11.7(L)  Hematocrit 39.0 - 52.0 % 22.1(L) 21.3(L) 34.8(L)  Platelets 150 - 400 K/uL 258 308 247    . CMP Latest Ref Rng & Units 08/15/2020 08/15/2020 08/10/2020  Glucose 70 - 99 mg/dL 91 99 77  BUN 8 - 23 mg/dL 72(H) 73(H) 32(H)  Creatinine 0.61 - 1.24 mg/dL 6.83(H) 6.83(H) 6.84(H)  Sodium 135 - 145 mmol/L 143 142 138  Potassium 3.5 - 5.1 mmol/L 4.7 4.3 4.2  Chloride 98 - 111 mmol/L 94(L) 92(L) 96(L)  CO2 22 - 32 mmol/L 23 24 25   Calcium 8.9 - 10.3 mg/dL 9.5 9.4 9.7  Total Protein 6.5 - 8.1 g/dL 6.3(L) 6.2(L) -  Total Bilirubin 0.3 - 1.2 mg/dL 1.4(H) 1.2 -  Alkaline Phos 38 - 126 U/L 88 88 -  AST 15 - 41 U/L 25 28 -  ALT 0 - 44 U/L 11 11 -   Studies/Results: DG Chest Port 1 View  Result Date: 08/15/2020 CLINICAL DATA:  Shortness of breath. EXAM: PORTABLE CHEST 1 VIEW COMPARISON:  04/08/2019 FINDINGS: 0748 hours. The lungs are clear without focal pneumonia, edema, pneumothorax or pleural effusion. Skin folds project over  each upper hemithorax. The cardiopericardial silhouette is within normal limits for size. Vascular stent device noted left subclavian region. Telemetry leads overlie the chest. IMPRESSION: No active  disease. Electronically Signed   By: Misty Stanley M.D.   On: 08/15/2020 08:03    Active Problems:   * No active hospital problems. Tye Savoy, NP-C @  08/15/2020, 10:25 AM

## 2020-08-16 DIAGNOSIS — N186 End stage renal disease: Secondary | ICD-10-CM | POA: Diagnosis not present

## 2020-08-16 DIAGNOSIS — D62 Acute posthemorrhagic anemia: Secondary | ICD-10-CM

## 2020-08-16 DIAGNOSIS — Z23 Encounter for immunization: Secondary | ICD-10-CM | POA: Diagnosis not present

## 2020-08-16 DIAGNOSIS — Z992 Dependence on renal dialysis: Secondary | ICD-10-CM | POA: Diagnosis not present

## 2020-08-16 DIAGNOSIS — R52 Pain, unspecified: Secondary | ICD-10-CM | POA: Diagnosis not present

## 2020-08-16 DIAGNOSIS — D631 Anemia in chronic kidney disease: Secondary | ICD-10-CM | POA: Diagnosis not present

## 2020-08-16 DIAGNOSIS — R197 Diarrhea, unspecified: Secondary | ICD-10-CM | POA: Diagnosis not present

## 2020-08-16 DIAGNOSIS — D509 Iron deficiency anemia, unspecified: Secondary | ICD-10-CM | POA: Diagnosis not present

## 2020-08-16 DIAGNOSIS — D689 Coagulation defect, unspecified: Secondary | ICD-10-CM

## 2020-08-16 DIAGNOSIS — N2581 Secondary hyperparathyroidism of renal origin: Secondary | ICD-10-CM | POA: Diagnosis not present

## 2020-08-16 DIAGNOSIS — Z283 Underimmunization status: Secondary | ICD-10-CM | POA: Diagnosis not present

## 2020-08-16 LAB — TYPE AND SCREEN
ABO/RH(D): AB POS
Antibody Screen: NEGATIVE
Unit division: 0
Unit division: 0

## 2020-08-16 LAB — CBC
HCT: 24.3 % — ABNORMAL LOW (ref 39.0–52.0)
Hemoglobin: 8 g/dL — ABNORMAL LOW (ref 13.0–17.0)
MCH: 29 pg (ref 26.0–34.0)
MCHC: 32.9 g/dL (ref 30.0–36.0)
MCV: 88 fL (ref 80.0–100.0)
Platelets: 164 10*3/uL (ref 150–400)
RBC: 2.76 MIL/uL — ABNORMAL LOW (ref 4.22–5.81)
RDW: 17.2 % — ABNORMAL HIGH (ref 11.5–15.5)
WBC: 13.4 10*3/uL — ABNORMAL HIGH (ref 4.0–10.5)
nRBC: 0 % (ref 0.0–0.2)

## 2020-08-16 LAB — BPAM RBC
Blood Product Expiration Date: 202202142359
Blood Product Expiration Date: 202203042359
ISSUE DATE / TIME: 202202090912
ISSUE DATE / TIME: 202202091551
Unit Type and Rh: 8400
Unit Type and Rh: 8400

## 2020-08-16 LAB — BPAM FFP
Blood Product Expiration Date: 202202142359
Blood Product Expiration Date: 202202142359
ISSUE DATE / TIME: 202202090914
ISSUE DATE / TIME: 202202091548
Unit Type and Rh: 8400
Unit Type and Rh: 8400

## 2020-08-16 LAB — RENAL FUNCTION PANEL
Albumin: 2.5 g/dL — ABNORMAL LOW (ref 3.5–5.0)
Anion gap: 22 — ABNORMAL HIGH (ref 5–15)
BUN: 104 mg/dL — ABNORMAL HIGH (ref 8–23)
CO2: 22 mmol/L (ref 22–32)
Calcium: 9.5 mg/dL (ref 8.9–10.3)
Chloride: 96 mmol/L — ABNORMAL LOW (ref 98–111)
Creatinine, Ser: 7.37 mg/dL — ABNORMAL HIGH (ref 0.61–1.24)
GFR, Estimated: 7 mL/min — ABNORMAL LOW (ref >60)
Glucose, Bld: 62 mg/dL — ABNORMAL LOW (ref 70–99)
Phosphorus: 7.2 mg/dL — ABNORMAL HIGH (ref 2.5–4.6)
Potassium: 4.8 mmol/L (ref 3.5–5.1)
Sodium: 140 mmol/L (ref 135–145)

## 2020-08-16 LAB — PREPARE FRESH FROZEN PLASMA: Unit division: 0

## 2020-08-16 LAB — HEMOGLOBIN AND HEMATOCRIT, BLOOD
HCT: 23.3 % — ABNORMAL LOW (ref 39.0–52.0)
Hemoglobin: 7.8 g/dL — ABNORMAL LOW (ref 13.0–17.0)

## 2020-08-16 LAB — PROTIME-INR
INR: 1.4 — ABNORMAL HIGH (ref 0.8–1.2)
INR: 1.4 — ABNORMAL HIGH (ref 0.8–1.2)
Prothrombin Time: 16.7 seconds — ABNORMAL HIGH (ref 11.4–15.2)
Prothrombin Time: 16.7 seconds — ABNORMAL HIGH (ref 11.4–15.2)

## 2020-08-16 LAB — HEMOGLOBIN: Hemoglobin: 8.4 g/dL — ABNORMAL LOW (ref 13.0–17.0)

## 2020-08-16 MED ORDER — DOXERCALCIFEROL 4 MCG/2ML IV SOLN
INTRAVENOUS | Status: AC
  Filled 2020-08-16: qty 4

## 2020-08-16 NOTE — Progress Notes (Signed)
PROGRESS NOTE    Jason BAGOT Sr.  UXN:[id number] DOB: [birth year] DOA: 08/15/2020 PCP: [name], MD    Brief Narrative:  75 y.o. male with medical history significant of atrial fibrillation on Coumadin, ESRD on HD (TTS), hypertension, diarrhea, CVA, stage IV rectal cancer s/p chemotherapy, radiation, laparoscopic low anterior resection with diverting loop ileostomy and subsequent ileostomy takedown presented with complaints of abdominal pain and diarrhea.  Patient reported that over the last 4 days he had been having dark stools and intermittent epigastric abdominal pain.  He chronically has diarrhea for which he has been followed by [facility] gastroenterology.  Patient reported associated symptoms of nausea, vomiting, right leg leg swelling, and pain.  Emesis was also noted to be dark in color. He is followed up by his primary care provider 2 days ago and x-rays revealed concern for a healing nondisplaced fracture of the mid fibula.    Assessment & Plan:   Principal Problem:   GI bleed Active Problems:   Essential hypertension   Chronic HFrEF (heart failure with reduced ejection fraction) (HCC)   ESRD on dialysis (HCC)   Paroxysmal atrial fibrillation (HCC)   Coagulopathy (HCC)   Leg edema, right   Acute blood loss anemia   Supratherapeutic INR   Fibula fracture   Acute gastric ulcer with hemorrhage  Acute blood loss anemia secondary to GI bleed:  -Patient presents with complaints of dark stools for the last 4 days prior to visit -Stool guaiacs were noted to be positive and hemoglobin acutely dropped from 11.7 g/dL down to 7.1 g/dL today.   -GI consulted and pt underwent EGD on 2/9 with findings of ulceration through the pyloric channel into the duodenal bulb s/p epinephrine injection and clipping -Pt is continued on protonix gtt, to change to IV protonix BID on 2/12 -GI recs for continued clears and possible change to full liquids tonight if pt does well today  Transient  hypotension secondary to hemorrhagic shock: Acute.   On admission blood pressure as low as 64/45 with improvement after initial fluid boluses.  Suspected secondary to acute blood loss.  now hemodynamically stable  Paroxysmal atrial fibrillation with supratherapeutic INR:  -On admission INR elevated at 8.3. He had been recently on antibiotics to treat for a right leg cellulitis which likely resulted in elevated INR.  Due to acute bleed patient was given 1 unit of FFP and 10 mg of vitamin K IV. -Cont to hold anticoagulation until OK to resume per GI  SIRS lactic acid: Acute.   -On admission patient noted to be tachycardic with initial WBC elevated at 26.2 and lactic acid initially 4.1.  Suspect symptoms could be likely related to acute blood loss.   -Pt was started on empiric antibiotics of metronidazole and cefepime -Blood cx neg thus far -CXR reviewed, clear -Pt has remained afebrile. Denies fevers, chills prior to visit -WBC has trended down.  -Will hold further abx and cont to monitor  Nondisplaced right fibula fracture:  -Patient reporting right leg swelling after accidentally hitting his leg against a table home almost 2 weeks ago.  He had been treated for concern for cellulitis with doxycycline.  X-rays from 2/7 revealed the likely fracture.  No significant erythema appreciated at this time patient's primary care provider had placed a referral to orthopedics. -Cont with cam boot ordered with weightbearing as tolerated  Diarrhea: Acute on chronic.   -Patient reported that his last bowel movement was prior to coming in yesterday, but have been  on antibiotics recently.  He notes that he has had issues with diarrhea ever since his prior surgery for the rectal cancer in 2014 -Stable at present  History of rectal cancer: Patient initially diagnosed in 2014 with rectal cancer s/p chemotherapy, radiation, laparoscopic low anterior resection with diverting loop ileostomy and subsequent  ileostomy takedown.  Pathology revealed invasive adenocarcinoma.  Ever since that surgery patient reports that he has had diarrhea. Cont follow  ESRD on HD:  -Patient normally dialyzes Tuesday, Thursday, and Saturday. -Nephrology consulted and pt was seen on HD today -Cont hd as per Nephrology  Heart failure with preserved EF:  -Last EF noted to be 50-55% based off echocardiogram from 12/23/2019 with mild mitral regurgitation, tricuspid regurgitation, and pulmonic regurgitation, improved from prior EF of 20- 25% based off echocardiogram from 06/2018. -cont volume management via HD above  DVT prophylaxis: SCD's Code Status: Full Family Communication: Pt in room, family not at bedside  Status is: Inpatient  Remains inpatient appropriate because:IV treatments appropriate due to intensity of illness or inability to take PO and Inpatient level of care appropriate due to severity of illness   Dispo: The patient is from: Home              Anticipated d/c is to: Home              Anticipated d/c date is: 3 days              Patient currently is not medically stable to d/c.   Difficult to place patient No       Consultants:   GI  Procedures:   EGD 2/9  Antimicrobials: Anti-infectives (From admission, onward)   Start     Dose/Rate Route Frequency Ordered Stop   08/16/20 1800  ceFEPIme (MAXIPIME) 1 g in sodium chloride 0.9 % 100 mL IVPB        1 g 200 mL/hr over 30 Minutes Intravenous Every 24 hours 08/15/20 1234     08/16/20 1200  ceFEPIme (MAXIPIME) 1 g in sodium chloride 0.9 % 100 mL IVPB  Status:  Discontinued        1 g 200 mL/hr over 30 Minutes Intravenous Every 24 hours 08/15/20 1225 08/15/20 1234   08/15/20 2000  metroNIDAZOLE (FLAGYL) IVPB 500 mg        500 mg 100 mL/hr over 60 Minutes Intravenous Every 8 hours 08/15/20 1357     08/15/20 1100  ceFEPIme (MAXIPIME) 2 g in sodium chloride 0.9 % 100 mL IVPB        2 g 200 mL/hr over 30 Minutes Intravenous  Once  08/15/20 1050 08/15/20 1248   08/15/20 1100  metroNIDAZOLE (FLAGYL) IVPB 500 mg        500 mg 100 mL/hr over 60 Minutes Intravenous  Once 08/15/20 1050 08/15/20 1424       Subjective: Seen on HD today. Did complain of some mild abd discomfort  Objective: Vitals:   08/16/20 0957 08/16/20 1006 08/16/20 1037 08/16/20 1214  BP: (!) 97/47 (!) 100/50 (!) 119/57 108/62  Pulse: 90 88 90 90  Resp: 10 (!) 9 11 14   Temp:  98.8 F (37.1 C)  99.3 F (37.4 C)  TempSrc:    Oral  SpO2: 100% 100% 100% 100%  Weight:      Height:        Intake/Output Summary (Last 24 hours) at 08/16/2020 1533 Last data filed at 08/16/2020 1300 Gross per 24 hour  Intake 2039.47 ml  Output 1750 ml  Net 289.47 ml   Filed Weights   08/15/20 1540 08/16/20 0722  Weight: 63.5 kg 58.5 kg    Examination:  General exam: Appears calm and comfortable  Respiratory system: Clear to auscultation. Respiratory effort normal. Cardiovascular system: S1 & S2 heard, Regular Gastrointestinal system: Abdomen is nondistended, soft, pos BS Central nervous system: Alert and oriented. No focal neurological deficits. Extremities: Symmetric 5 x 5 power. Skin: No rashes, lesions Psychiatry: Judgement and insight appear normal. Mood & affect appropriate.   Data Reviewed: I have personally reviewed following labs and imaging studies  CBC: Recent Labs  Lab 08/10/20 1433 08/15/20 0659 08/15/20 0725 08/15/20 1248 08/16/20 0205 08/16/20 0620 08/16/20 1341  WBC 5.9 25.0* 26.2*  --   --  13.4*  --   NEUTROABS  --  21.9* 23.4*  --   --   --   --   HGB 11.7* 7.0* 7.1* 7.2* 7.8* 8.0* 8.4*  HCT 34.8* 21.3* 22.1* 21.0* 23.3* 24.3*  --   MCV 92.1 93.4 93.6  --   --  88.0  --   PLT 247 308 258  --   --  164  --    Basic Metabolic Panel: Recent Labs  Lab 08/10/20 1433 08/15/20 0659 08/15/20 0725 08/16/20 0620  NA 138 142 143 140  K 4.2 4.3 4.7 4.8  CL 96* 92* 94* 96*  CO2 25 24 23 22   GLUCOSE 77 99 91 62*  BUN 32* 73*  72* 104*  CREATININE 6.84* 6.83* 6.83* 7.37*  CALCIUM 9.7 9.4 9.5 9.5  PHOS  --   --   --  7.2*   GFR: Estimated Creatinine Clearance: 7.4 mL/min (A) (by C-G formula based on SCr of 7.37 mg/dL (H)). Liver Function Tests: Recent Labs  Lab 08/15/20 0659 08/15/20 0725 08/16/20 0620  AST 28 25  --   ALT 11 11  --   ALKPHOS 88 88  --   BILITOT 1.2 1.4*  --   PROT 6.2* 6.3*  --   ALBUMIN 2.9* 3.1* 2.5*   Recent Labs  Lab 08/15/20 0659  LIPASE 47   No results for input(s): AMMONIA in the last 168 hours. Coagulation Profile: Recent Labs  Lab 08/13/20 0000 08/15/20 0659 08/15/20 1248 08/16/20 0036 08/16/20 0610  INR 4.8* 8.3* 2.1* 1.4* 1.4*   Cardiac Enzymes: No results for input(s): CKTOTAL, CKMB, CKMBINDEX, TROPONINI in the last 168 hours. BNP (last 3 results) No results for input(s): PROBNP in the last 8760 hours. HbA1C: No results for input(s): HGBA1C in the last 72 hours. CBG: No results for input(s): GLUCAP in the last 168 hours. Lipid Profile: No results for input(s): CHOL, HDL, LDLCALC, TRIG, CHOLHDL, LDLDIRECT in the last 72 hours. Thyroid Function Tests: No results for input(s): TSH, T4TOTAL, FREET4, T3FREE, THYROIDAB in the last 72 hours. Anemia Panel: No results for input(s): VITAMINB12, FOLATE, FERRITIN, TIBC, IRON, RETICCTPCT in the last 72 hours. Sepsis Labs: Recent Labs  Lab 08/15/20 0912 08/15/20 1211 08/15/20 1830 08/15/20 2126  LATICACIDVEN 4.1* 2.8* 3.0* 1.1    Recent Results (from the past 240 hour(s))  Blood culture (routine single)     Status: None (Preliminary result)   Collection Time: 08/15/20  7:12 AM   Specimen: BLOOD  Result Value Ref Range Status   Specimen Description BLOOD SITE NOT SPECIFIED  Final   Special Requests   Final    BOTTLES DRAWN AEROBIC AND ANAEROBIC Blood Culture results may not be optimal due to  an inadequate volume of blood received in culture bottles   Culture   Final    NO GROWTH < 24 HOURS Performed at  [facility] [address] [place], [place] [address]    Report Status PENDING  Incomplete  Resp Panel by RT-PCR (Flu A&B, Covid) Nasopharyngeal Swab     Status: None   Collection Time: 08/15/20  7:18 AM   Specimen: Nasopharyngeal Swab; Nasopharyngeal(NP) swabs in vial transport medium  Result Value Ref Range Status   SARS Coronavirus 2 by RT PCR NEGATIVE NEGATIVE Final    Comment: (NOTE) SARS-CoV-2 target nucleic acids are NOT DETECTED.  The SARS-CoV-2 RNA is generally detectable in upper respiratory specimens during the acute phase of infection. The lowest concentration of SARS-CoV-2 viral copies this assay can detect is 138 copies/mL. A negative result does not preclude SARS-Cov-2 infection and should not be used as the sole basis for treatment or other patient management decisions. A negative result may occur with  improper specimen collection/handling, submission of specimen other than nasopharyngeal swab, presence of viral mutation(s) within the areas targeted by this assay, and inadequate number of viral copies(<138 copies/mL). A negative result must be combined with clinical observations, patient history, and epidemiological information. The expected result is Negative.  Fact Sheet for Patients:  EntrepreneurPulse.com.au  Fact Sheet for Healthcare Providers:  IncredibleEmployment.be  This test is no t yet approved or cleared by the [place] FDA and  has been authorized for detection and/or diagnosis of SARS-CoV-2 by FDA under an Emergency Use Authorization (EUA). This EUA will remain  in effect (meaning this test can be used) for the duration of the COVID-19 declaration under Section 564(b)(1) of the Act, 21 U.S.C.section 360bbb-3(b)(1), unless the authorization is terminated  or revoked sooner.       Influenza A by PCR NEGATIVE NEGATIVE Final   Influenza B by PCR NEGATIVE NEGATIVE Final    Comment: (NOTE) The Xpert  Xpress SARS-CoV-2/FLU/RSV plus assay is intended as an aid in the diagnosis of influenza from Nasopharyngeal swab specimens and should not be used as a sole basis for treatment. Nasal washings and aspirates are unacceptable for Xpert Xpress SARS-CoV-2/FLU/RSV testing.  Fact Sheet for Patients: EntrepreneurPulse.com.au  Fact Sheet for Healthcare Providers: IncredibleEmployment.be  This test is not yet approved or cleared by the [place] FDA and has been authorized for detection and/or diagnosis of SARS-CoV-2 by FDA under an Emergency Use Authorization (EUA). This EUA will remain in effect (meaning this test can be used) for the duration of the COVID-19 declaration under Section 564(b)(1) of the Act, 21 U.S.C. section 360bbb-3(b)(1), unless the authorization is terminated or revoked.  Performed at [facility], [place] [address], [place], [place] [address]   Culture, blood (single)     Status: None (Preliminary result)   Collection Time: 08/15/20 10:50 AM   Specimen: BLOOD  Result Value Ref Range Status   Specimen Description BLOOD SITE NOT SPECIFIED  Final   Special Requests   Final    BOTTLES DRAWN AEROBIC AND ANAEROBIC Blood Culture adequate volume   Culture   Final    NO GROWTH < 24 HOURS Performed at [facility], [place] [address], [place], [place] [address]    Report Status PENDING  Incomplete     Radiology Studies: DG Chest Port 1 View  Result Date: 08/15/2020 CLINICAL DATA:  Shortness of breath. EXAM: PORTABLE CHEST 1 VIEW COMPARISON:  04/08/2019 FINDINGS: 0748 hours. The lungs are clear  without focal pneumonia, edema, pneumothorax or pleural effusion. Skin folds project over each upper hemithorax. The cardiopericardial silhouette is within normal limits for size. Vascular stent device noted left subclavian region. Telemetry leads overlie the chest. IMPRESSION: No active disease. Electronically Signed   By: Misty Stanley M.D.    On: 08/15/2020 08:03    Scheduled Meds: . sodium chloride   Intravenous Once  . Chlorhexidine Gluconate Cloth  6 each Topical Q0600  . cinacalcet  60 mg Oral Q supper  . doxercalciferol      . doxercalciferol  7 mcg Intravenous Q T,Th,Sa-HD  . [START ON 08/18/2020] pantoprazole  40 mg Intravenous Q12H  . sodium chloride flush  3 mL Intravenous Q12H   Continuous Infusions: . ceFEPime (MAXIPIME) IV    . metronidazole 100 mL/hr at 08/16/20 1300  . pantoprozole (PROTONIX) infusion 8 mg/hr (08/16/20 1119)     LOS: 1 day   Marylu Lund, MD [facility] Pager On Amion  If 7PM-7AM, please contact night-coverage 08/16/2020, 3:33 PM

## 2020-08-16 NOTE — Progress Notes (Signed)
[facility] Progress Note  Subjective: seen on HD, no c/o  Vitals:   08/16/20 0957 08/16/20 1006 08/16/20 1037 08/16/20 1214  BP: (!) 97/47 (!) 100/50 (!) 119/57 108/62  Pulse: 90 88 90 90  Resp: 10 (!) 9 11 14   Temp:  98.8 F (37.1 C)  99.3 F (37.4 C)  TempSrc:    Oral  SpO2: 100% 100% 100% 100%  Weight:      Height:        Exam:   alert, nad   no jvd  Chest cta bilat  Cor reg no RG  Abd soft ntnd no ascites   Ext no LE edema   Alert, NF, ox3   LUA AVF+bruit    OP HD: Home meds:  - bidil tid / metoprolol xl 25 qd  - warfarin qd  - oxy IR qid prn/ neurontin 100 tid  - lomotil qid/ creon ac tid/ prn zofran/ fibercon  - zyloprim 100 / lipitor 20/ sensipar 60 qd  - prn's/ vitamins/ supplements    OP HD: [place] TTS  4h  3K/2Ca bath  62kg (coming off under) Hep none  LUA AVF  - hect 7 ug  - venofer 50 q wk  - mircera 50 q4 wks, last 1/25   Assessment/ Plan: 1. GI bleed - w/ melena. Per pmd.  2. ESRD - usual HD TTS. HD today.  3. H/o CVA 4. Atrial fib - on coumadin, high INR, per pmd 5. Hypotension - prob d/t GIB, improved w/ blood products and IVF in ED.  6. Volume - some LE edema, losing some body wt, 3-4kg under post hd today.  7. HTN - on BB and bidil, resume prn 8. COPD 9. Anemia CKD + GIB - next esa due , ordered darbe 60 ug w/ HD 2/10 10. MBD ckd - cont meds      Rob Maryclaire Stoecker 08/16/2020, 4:18 PM   Recent Labs  Lab 08/15/20 0725 08/15/20 1248 08/16/20 0620 08/16/20 1341  K 4.7  --  4.8  --   BUN 72*  --  104*  --   CREATININE 6.83*  --  7.37*  --   CALCIUM 9.5  --  9.5  --   PHOS  --   --  7.2*  --   HGB 7.1*   < > 8.0* 8.4*   < > = values in this interval not displayed.   Inpatient medications: . sodium chloride   Intravenous Once  . Chlorhexidine Gluconate Cloth  6 each Topical Q0600  . cinacalcet  60 mg Oral Q supper  . doxercalciferol      . doxercalciferol  7 mcg Intravenous Q T,Th,Sa-HD  . [START ON  08/18/2020] pantoprazole  40 mg Intravenous Q12H  . sodium chloride flush  3 mL Intravenous Q12H   . ceFEPime (MAXIPIME) IV    . metronidazole Stopped (08/16/20 1340)  . pantoprozole (PROTONIX) infusion 8 mg/hr (08/16/20 1119)   acetaminophen **OR** acetaminophen, ondansetron **OR** ondansetron (ZOFRAN) IV

## 2020-08-16 NOTE — Plan of Care (Signed)
  Problem: Pain Managment: Goal: General experience of comfort will improve Outcome: Progressing   Problem: Safety: Goal: Ability to remain free from injury will improve Outcome: Progressing   Problem: Clinical Measurements: Goal: Complications related to the disease process, condition or treatment will be avoided or minimized Outcome: Progressing

## 2020-08-16 NOTE — Progress Notes (Signed)
Progress Note   Subjective  Patient doing well. No pain. He passed what he thinks was some old blood last night. Hgb stable since endoscopy. Wants to have something to drink   Objective   Vital signs in last 24 hours: Temp:  [97.2 F (36.2 C)-98.9 F (37.2 C)] 98.9 F (37.2 C) (02/10 0722) Pulse Rate:  [73-105] 82 (02/10 0800) Resp:  [8-20] 11 (02/10 0800) BP: (89-145)/(42-85) 113/64 (02/10 0800) SpO2:  [94 %-100 %] 100 % (02/10 0800) Weight:  [58.5 kg-63.5 kg] 58.5 kg (02/10 0722)   General:    AA male in NAD Abdomen:  Soft, nontender and nondistended.  Extremities:  Without edema. Neurologic:  Alert and oriented,  grossly normal neurologically. Psych:  Cooperative. Normal mood and affect.  Intake/Output from previous day: 02/09 0701 - 02/10 0700 In: 1689.5 [I.V.:300; Blood:1389.5] Out: 250 [Urine:250] Intake/Output this shift: No intake/output data recorded.  Lab Results: Recent Labs    08/15/20 0659 08/15/20 0725 08/15/20 1248 08/16/20 0205 08/16/20 0620  WBC 25.0* 26.2*  --   --  13.4*  HGB 7.0* 7.1* 7.2* 7.8* 8.0*  HCT 21.3* 22.1* 21.0* 23.3* 24.3*  PLT 308 258  --   --  164   BMET Recent Labs    08/15/20 0659 08/15/20 0725 08/16/20 0620  NA 142 143 140  K 4.3 4.7 4.8  CL 92* 94* 96*  CO2 24 23 22   GLUCOSE 99 91 62*  BUN 73* 72* 104*  CREATININE 6.83* 6.83* 7.37*  CALCIUM 9.4 9.5 9.5   LFT Recent Labs    08/15/20 0725 08/16/20 0620  PROT 6.3*  --   ALBUMIN 3.1* 2.5*  AST 25  --   ALT 11  --   ALKPHOS 88  --   BILITOT 1.4*  --    PT/INR Recent Labs    08/16/20 0036 08/16/20 0610  LABPROT 16.7* 16.7*  INR 1.4* 1.4*    Studies/Results: DG Chest Port 1 View  Result Date: 08/15/2020 CLINICAL DATA:  Shortness of breath. EXAM: PORTABLE CHEST 1 VIEW COMPARISON:  04/08/2019 FINDINGS: 0748 hours. The lungs are clear without focal pneumonia, edema, pneumothorax or pleural effusion. Skin folds project over each upper hemithorax. The  cardiopericardial silhouette is within normal limits for size. Vascular stent device noted left subclavian region. Telemetry leads overlie the chest. IMPRESSION: No active disease. Electronically Signed   By: Misty Stanley M.D.   On: 08/15/2020 08:03       Assessment / Plan:    75 y/o male with history of ESRD, CVA, OSA, HTN, COPD, history of rectal cancer, on chronic coumadin, presented with dark stools in the setting of recent NSAID use and supratherapeutic INR of 8s. Hypotensive on admission, resuscitated well and given FFP and vitamin K yesterday to get him through EGD.  EGD showed a cratered ulcer extending through the pyloric channel into the duodenal bulb where there was a suspected vessel / adherent clot that had active bleeding. The area was treated with epinephrine injection and hemostasis clip x 1 with good result. Hgb stable post procedure, he is feeling better. At risk for rebleeding given appearance of ulcer yesterday.   Recommend: - clear liquid diet for now, can advance to full liquids tonight if he does well today - monitor for recurrent bleeding. He may likely pass some old blood today, trend Hgb - continue IV protonix drip for total 72 hours from endoscopic therapy - continue to hold coumadin for today -  H pylori IgG serology, treat if positive - avoid all NSAIDs (he has taken these recently)  Call with questions or recurrence of bleeding. Will continue to follow  [place] Cellar, MD [facility] Gastroenterology

## 2020-08-16 NOTE — Progress Notes (Signed)
Orthopedic Tech Progress Note Patient Details:  Jason DOWE Sr. 06-20-1946 [id number] Left boot in room  Ortho Devices Type of Ortho Device: CAM walker Ortho Device/Splint Interventions: Ordered   Post Interventions Patient Tolerated: Other (comment) Instructions Provided: Other (comment)   Ellouise Newer 08/16/2020, 2:46 PM

## 2020-08-17 ENCOUNTER — Telehealth: Payer: Self-pay

## 2020-08-17 LAB — COMPREHENSIVE METABOLIC PANEL
ALT: 11 U/L (ref 0–44)
AST: 19 U/L (ref 15–41)
Albumin: 2.5 g/dL — ABNORMAL LOW (ref 3.5–5.0)
Alkaline Phosphatase: 72 U/L (ref 38–126)
Anion gap: 16 — ABNORMAL HIGH (ref 5–15)
BUN: 52 mg/dL — ABNORMAL HIGH (ref 8–23)
CO2: 27 mmol/L (ref 22–32)
Calcium: 9.1 mg/dL (ref 8.9–10.3)
Chloride: 97 mmol/L — ABNORMAL LOW (ref 98–111)
Creatinine, Ser: 5.63 mg/dL — ABNORMAL HIGH (ref 0.61–1.24)
GFR, Estimated: 10 mL/min — ABNORMAL LOW (ref >60)
Glucose, Bld: 60 mg/dL — ABNORMAL LOW (ref 70–99)
Potassium: 3.8 mmol/L (ref 3.5–5.1)
Sodium: 140 mmol/L (ref 135–145)
Total Bilirubin: 1.3 mg/dL — ABNORMAL HIGH (ref 0.3–1.2)
Total Protein: 5.5 g/dL — ABNORMAL LOW (ref 6.5–8.1)

## 2020-08-17 LAB — URINALYSIS, ROUTINE W REFLEX MICROSCOPIC
Bilirubin Urine: NEGATIVE
Glucose, UA: NEGATIVE mg/dL
Ketones, ur: 5 mg/dL — AB
Nitrite: NEGATIVE
Protein, ur: 100 mg/dL — AB
RBC / HPF: >50 RBC/hpf — ABNORMAL HIGH (ref 0–5)
Specific Gravity, Urine: 1.012 (ref 1.005–1.030)
pH: 8 (ref 5.0–8.0)

## 2020-08-17 LAB — CBC
HCT: 23.5 % — ABNORMAL LOW (ref 39.0–52.0)
Hemoglobin: 7.9 g/dL — ABNORMAL LOW (ref 13.0–17.0)
MCH: 29.2 pg (ref 26.0–34.0)
MCHC: 33.6 g/dL (ref 30.0–36.0)
MCV: 86.7 fL (ref 80.0–100.0)
Platelets: 170 10*3/uL (ref 150–400)
RBC: 2.71 MIL/uL — ABNORMAL LOW (ref 4.22–5.81)
RDW: 16.6 % — ABNORMAL HIGH (ref 11.5–15.5)
WBC: 11.1 10*3/uL — ABNORMAL HIGH (ref 4.0–10.5)
nRBC: 0 % (ref 0.0–0.2)

## 2020-08-17 LAB — PROTIME-INR
INR: 1.5 — ABNORMAL HIGH (ref 0.8–1.2)
Prothrombin Time: 17.2 seconds — ABNORMAL HIGH (ref 11.4–15.2)

## 2020-08-17 MED ORDER — LOPERAMIDE HCL 2 MG PO CAPS
2.0000 mg | ORAL_CAPSULE | ORAL | Status: DC | PRN
  Administered 2020-08-17: 2 mg via ORAL
  Filled 2020-08-17: qty 1

## 2020-08-17 MED ORDER — DARBEPOETIN ALFA 60 MCG/0.3ML IJ SOSY
60.0000 ug | PREFILLED_SYRINGE | INTRAMUSCULAR | Status: DC
  Administered 2020-08-18: 60 ug via INTRAVENOUS
  Filled 2020-08-17: qty 0.3

## 2020-08-17 NOTE — Telephone Encounter (Signed)
Pts scheduled appt moved to 09/25/20@8 :40am for ulcer f/u.

## 2020-08-17 NOTE — Progress Notes (Addendum)
Progress Note   Subjective  Patient states he is feeling okay. No pain. Tolerating clear liquids. Spoke with nursing, he had a dark brown BM overnight but not concerning for active bleeding.    Objective   Vital signs in last 24 hours: Temp:  [98.6 F (37 C)-99.3 F (37.4 C)] 99.2 F (37.3 C) (02/11 0758) Pulse Rate:  [69-94] 77 (02/11 0800) Resp:  [9-17] 12 (02/11 0800) BP: (91-140)/(47-74) 111/48 (02/11 0800) SpO2:  [92 %-100 %] 100 % (02/11 0800) Last BM Date: 08/16/20 General:    AA male in NAD Abdomen:  Soft, nontender and nondistended.  Extremities:  Without edema. Neurologic:  Alert and oriented,  grossly normal neurologically. Psych:  Cooperative. Normal mood and affect.  Intake/Output from previous day: 02/10 0701 - 02/11 0700 In: 566.7 [I.V.:167.1; IV Piggyback:399.6] Out: 1950 [Urine:450] Intake/Output this shift: No intake/output data recorded.  Lab Results: Recent Labs    08/15/20 0725 08/15/20 1248 08/16/20 0205 08/16/20 0620 08/16/20 1341 08/17/20 0225  WBC 26.2*  --   --  13.4*  --  11.1*  HGB 7.1*   < > 7.8* 8.0* 8.4* 7.9*  HCT 22.1*   < > 23.3* 24.3*  --  23.5*  PLT 258  --   --  164  --  170   < > = values in this interval not displayed.   BMET Recent Labs    08/15/20 0725 08/16/20 0620 08/17/20 0225  NA 143 140 140  K 4.7 4.8 3.8  CL 94* 96* 97*  CO2 23 22 27   GLUCOSE 91 62* 60*  BUN 72* 104* 52*  CREATININE 6.83* 7.37* 5.63*  CALCIUM 9.5 9.5 9.1   LFT Recent Labs    08/17/20 0225  PROT 5.5*  ALBUMIN 2.5*  AST 19  ALT 11  ALKPHOS 72  BILITOT 1.3*   PT/INR Recent Labs    08/16/20 0610 08/17/20 0225  LABPROT 16.7* 17.2*  INR 1.4* 1.5*    Studies/Results: No results found.     Assessment / Plan:    75 y/o male with history of ESRD, CVA, OSA, HTN, COPD, history of rectal cancer, on chronic coumadin, presented with dark stools in the setting of recent NSAID use and supratherapeutic INR of 8s. EGD on the  evening of 2/9 showed a cratered ulcer extending through the pyloric channel into the duodenal bulb where there was a suspected vessel / adherent clot that had active bleeding. The area was treated with epinephrine injection and hemostasis clip x 1 with good result.   Since his procedure he has done well. Hgb relatively stable, no further evidence of active bleeding. Tolerating clear liquids. INR at 1.5, coumadin held right now.   Recommend: - advance diet as tolerated - full liquid this AM and can then progress to soft diet later today - continue IV protonix drip for total 72 hours from endoscopic therapy (due to stop IV PPI drip tomorrow evening). Will need protonix 40mg  BID thereafter until he can have a follow up EGD as outpatient to confirm mucosal healing in a few months - continue to hold coumadin for now. He does have a history of CVA / AF and want to resume this in the near future when safe to do so but this ulcer is at risk for rebleeding. Would resume this early next week (in 3-5 days) or so if he is otherwise stable, and keep INR at lowest end of acceptable range - minimize blood draws,  repeat Hgb in the AM - H pylori IgG serology to be drawn, treat if positive - avoid all NSAIDs (he has taken these recently) - recommend repeat EGD in a few months to confirm mucosal healing of ulcer  Call with questions or recurrence of bleeding. We will not round on him over the weekend if he is otherwise stable. Can be discharged later this weekend once done with IV protonix protocol if otherwise stable. Our office will call to coordinate outpatient follow up.  [place] Cellar, MD [facility]enter[facility]

## 2020-08-17 NOTE — Telephone Encounter (Signed)
-----   Message from Yetta Flock, MD sent at 08/17/2020  8:29 AM EST ----- Regarding: outpatient [facility] follow up [facility],  This patient should have follow up with Dr. Loletha Carrow or APP in the next 4-6 weeks for hospital follow up, gastric ulcer. Thanks

## 2020-08-17 NOTE — Progress Notes (Signed)
[facility] Progress Note  Subjective: seen on HD, no c/o  Vitals:   08/16/20 2319 08/17/20 0406 08/17/20 0758 08/17/20 0800  BP: 118/66 124/68 (!) 111/48 (!) 111/48  Pulse: 70 91 78 77  Resp: 17 13 13 12   Temp: 98.6 F (37 C) 98.8 F (37.1 C) 99.2 F (37.3 C)   TempSrc: Oral Oral Oral   SpO2: 96% 100% 93% 100%  Weight:      Height:        Exam:   alert, nad   no jvd  Chest cta bilat  Cor reg no RG  Abd soft ntnd no ascites   Ext no LE edema   Alert, NF, ox3   LUA AVF+bruit    OP HD: Home meds:  - bidil tid / metoprolol xl 25 qd  - warfarin qd  - oxy IR qid prn/ neurontin 100 tid  - lomotil qid/ creon ac tid/ prn zofran/ fibercon  - zyloprim 100 / lipitor 20/ sensipar 60 qd  - prn's/ vitamins/ supplements    OP HD: [place] TTS  4h  3K/2Ca bath  62kg (coming off under) Hep none  LUA AVF  - hect 7 ug  - venofer 50 q wk  - mircera 50 q4 wks, last 1/25   Assessment/ Plan: 1. GI bleed - pt on nsaids at home. EGD showed pyloric channel ulcer w/ active bleeding, treated w/ clip/ injection, PPI. Holding coumadin for now.  2. ESRD - usual HD TTS. Next HD Sat.  3. H/o CVA 4. Atrial fib - on coumadin, high INR > reversed 5. Hypotension - resolved 6. Volume - some LE edema, losing some body wt, 3-4kg under  7. HTN - BB and bidil on hold, BP's good 8. COPD 9. Anemia CKD + GIB - next esa due , ordered darbe 60 ug w/ HD 2/12 10. MBD ckd - cont meds      Jason [name] 08/17/2020, 9:26 AM   Recent Labs  Lab 08/16/20 0620 08/16/20 1341 08/17/20 0225  K 4.8  --  3.8  BUN 104*  --  52*  CREATININE 7.37*  --  5.63*  CALCIUM 9.5  --  9.1  PHOS 7.2*  --   --   HGB 8.0* 8.4* 7.9*   Inpatient medications: . sodium chloride   Intravenous Once  . Chlorhexidine Gluconate Cloth  6 each Topical Q0600  . cinacalcet  60 mg Oral Q supper  . doxercalciferol  7 mcg Intravenous Q T,Th,Sa-HD  . [START ON 08/18/2020] pantoprazole  40 mg Intravenous Q12H  .  sodium chloride flush  3 mL Intravenous Q12H   . pantoprozole (PROTONIX) infusion 8 mg/hr (08/17/20 0000)   acetaminophen **OR** acetaminophen, ondansetron **OR** ondansetron (ZOFRAN) IV

## 2020-08-17 NOTE — Progress Notes (Signed)
PROGRESS NOTE    Jason WIERZBICKI Sr.  TIW:[id number] DOB: Apr 25, 1946 DOA: 08/15/2020 PCP: [name], MD    Brief Narrative:  75 y.o. male with medical history significant of atrial fibrillation on Coumadin, ESRD on HD (TTS), hypertension, diarrhea, CVA, stage IV rectal cancer s/p chemotherapy, radiation, laparoscopic low anterior resection with diverting loop ileostomy and subsequent ileostomy takedown presented with complaints of abdominal pain and diarrhea.  Patient reported that over the last 4 days he had been having dark stools and intermittent epigastric abdominal pain.  He chronically has diarrhea for which he has been followed by [facility] gastroenterology.  Patient reported associated symptoms of nausea, vomiting, right leg leg swelling, and pain.  Emesis was also noted to be dark in color. He is followed up by his primary care provider 2 days ago and x-rays revealed concern for a healing nondisplaced fracture of the mid fibula.    Assessment & Plan:   Principal Problem:   GI bleed Active Problems:   Essential hypertension   Chronic HFrEF (heart failure with reduced ejection fraction) (HCC)   ESRD on dialysis (HCC)   Paroxysmal atrial fibrillation (HCC)   Coagulopathy (HCC)   Leg edema, right   Acute blood loss anemia   Supratherapeutic INR   Fibula fracture   Acute gastric ulcer with hemorrhage  Acute blood loss anemia secondary to GI bleed:  -Patient presents with complaints of dark stools for the last 4 days prior to visit -Stool guaiacs were noted to be positive and hemoglobin acutely dropped from 11.7 g/dL down to 7.1 g/dL today.   -GI consulted and pt underwent EGD on 2/9 with findings of ulceration through the pyloric channel into the duodenal bulb s/p epinephrine injection and clipping -Pt is continued on protonix gtt, to change to IV protonix BID on 2/12 -GI recs for advancing diet. Per GI if stable over weekend and after completing IV PPI, can d/c from GI  standpoint  Transient hypotension secondary to hemorrhagic shock: Acute.   On admission blood pressure as low as 64/45 with improvement after initial fluid boluses.  Suspected secondary to acute blood loss.  currently hemodynamically stable  Paroxysmal atrial fibrillation with supratherapeutic INR:  -On admission INR elevated at 8.3. He had been recently on antibiotics to treat for a right leg cellulitis which likely resulted in elevated INR.  Due to acute bleed patient was given 1 unit of FFP and 10 mg of vitamin K IV. -Cont to hold anticoagulation. Per GI, can resume next week (in 3-5 days) and keep INR at lowest end of acceptable range  SIRS lactic acid: Acute, sepsis ruled out.  -On admission patient noted to be tachycardic with initial WBC elevated at 26.2 and lactic acid initially 4.1.  Suspect symptoms could be likely related to acute blood loss.   -Pt was started on empiric antibiotics of metronidazole and cefepime -Blood cx neg thus far -CXR reviewed, clear -Pt has remained afebrile. Denies fevers, chills prior to visit -WBC has trended down.  -Given no source of infection, have discontinued further abx  Nondisplaced right fibula fracture:  -Patient reporting right leg swelling after accidentally hitting his leg against a table home almost 2 weeks ago.  He had been treated for concern for cellulitis with doxycycline.  X-rays from 2/7 revealed the likely fracture.  No significant erythema appreciated at this time patient's primary care provider had placed a referral to orthopedics. -Confirmed with Orthopedic Surgery. Ortho recommendations to cont with cam boot with weightbearing  as tolerated. Have consulted PT  Diarrhea: Acute on chronic.   -Patient reported that his last bowel movement was prior to coming in yesterday, but have been on antibiotics recently.  He notes that he has had issues with diarrhea ever since his prior surgery for the rectal cancer in 2014 -Stable at  present  History of rectal cancer: Patient initially diagnosed in 2014 with rectal cancer s/p chemotherapy, radiation, laparoscopic low anterior resection with diverting loop ileostomy and subsequent ileostomy takedown.  Pathology revealed invasive adenocarcinoma.  Ever since that surgery patient reports that he has had diarrhea. Cont follow. Give imodium as needed  ESRD on HD:  -Patient normally dialyzes Tuesday, Thursday, and Saturday. -Nephrology is following -Cont hd as per Nephrology  Heart failure with preserved EF:  -Last EF noted to be 50-55% based off echocardiogram from 12/23/2019 with mild mitral regurgitation, tricuspid regurgitation, and pulmonic regurgitation, improved from prior EF of 20- 25% based off echocardiogram from 06/2018. -cont volume management per HD above  DVT prophylaxis: SCD's Code Status: Full Family Communication: Pt in room, family not at bedside  Status is: Inpatient  Remains inpatient appropriate because:IV treatments appropriate due to intensity of illness or inability to take PO and Inpatient level of care appropriate due to severity of illness   Dispo: The patient is from: Home              Anticipated d/c is to: Home              Anticipated d/c date is: 3 days              Patient currently is not medically stable to d/c.   Difficult to place patient No   Consultants:   GI  Procedures:   EGD 2/9  Antimicrobials: Anti-infectives (From admission, onward)   Start     Dose/Rate Route Frequency Ordered Stop   08/16/20 1800  ceFEPIme (MAXIPIME) 1 g in sodium chloride 0.9 % 100 mL IVPB  Status:  Discontinued        1 g 200 mL/hr over 30 Minutes Intravenous Every 24 hours 08/15/20 1234 08/17/20 0748   08/16/20 1200  ceFEPIme (MAXIPIME) 1 g in sodium chloride 0.9 % 100 mL IVPB  Status:  Discontinued        1 g 200 mL/hr over 30 Minutes Intravenous Every 24 hours 08/15/20 1225 08/15/20 1234   08/15/20 2000  metroNIDAZOLE (FLAGYL) IVPB 500  mg  Status:  Discontinued        500 mg 100 mL/hr over 60 Minutes Intravenous Every 8 hours 08/15/20 1357 08/17/20 0748   08/15/20 1100  ceFEPIme (MAXIPIME) 2 g in sodium chloride 0.9 % 100 mL IVPB        2 g 200 mL/hr over 30 Minutes Intravenous  Once 08/15/20 1050 08/15/20 1248   08/15/20 1100  metroNIDAZOLE (FLAGYL) IVPB 500 mg        500 mg 100 mL/hr over 60 Minutes Intravenous  Once 08/15/20 1050 08/15/20 1424      Subjective: Without complaints this AM  Objective: Vitals:   08/17/20 0406 08/17/20 0758 08/17/20 0800 08/17/20 1202  BP: 124/68 (!) 111/48 (!) 111/48 (!) 105/53  Pulse: 91 78 77 85  Resp: 13 13 12 14   Temp: 98.8 F (37.1 C) 99.2 F (37.3 C)  99.1 F (37.3 C)  TempSrc: Oral Oral  Oral  SpO2: 100% 93% 100% 100%  Weight:      Height:  Intake/Output Summary (Last 24 hours) at 08/17/2020 1532 Last data filed at 08/17/2020 1202 Gross per 24 hour  Intake 424.73 ml  Output 750 ml  Net -325.27 ml   Filed Weights   08/15/20 1540 08/16/20 0722  Weight: 63.5 kg 58.5 kg    Examination: General exam: Awake, laying in bed, in nad Respiratory system: Normal respiratory effort, no wheezing Cardiovascular system: regular rate, s1, s2 Gastrointestinal system: Soft, nondistended, positive BS Central nervous system: CN2-12 grossly intact, strength intact Extremities: Perfused, no clubbing Skin: Normal skin turgor, no notable skin lesions seen Psychiatry: Mood normal // no visual hallucinations   Data Reviewed: I have personally reviewed following labs and imaging studies  CBC: Recent Labs  Lab 08/15/20 0659 08/15/20 0725 08/15/20 1248 08/16/20 0205 08/16/20 0620 08/16/20 1341 08/17/20 0225  WBC 25.0* 26.2*  --   --  13.4*  --  11.1*  NEUTROABS 21.9* 23.4*  --   --   --   --   --   HGB 7.0* 7.1* 7.2* 7.8* 8.0* 8.4* 7.9*  HCT 21.3* 22.1* 21.0* 23.3* 24.3*  --  23.5*  MCV 93.4 93.6  --   --  88.0  --  86.7  PLT 308 258  --   --  164  --  786    Basic Metabolic Panel: Recent Labs  Lab 08/15/20 0659 08/15/20 0725 08/16/20 0620 08/17/20 0225  NA 142 143 140 140  K 4.3 4.7 4.8 3.8  CL 92* 94* 96* 97*  CO2 24 23 22 27   GLUCOSE 99 91 62* 60*  BUN 73* 72* 104* 52*  CREATININE 6.83* 6.83* 7.37* 5.63*  CALCIUM 9.4 9.5 9.5 9.1  PHOS  --   --  7.2*  --    GFR: Estimated Creatinine Clearance: 9.7 mL/min (A) (by C-G formula based on SCr of 5.63 mg/dL (H)). Liver Function Tests: Recent Labs  Lab 08/15/20 0659 08/15/20 0725 08/16/20 0620 08/17/20 0225  AST 28 25  --  19  ALT 11 11  --  11  ALKPHOS 88 88  --  72  BILITOT 1.2 1.4*  --  1.3*  PROT 6.2* 6.3*  --  5.5*  ALBUMIN 2.9* 3.1* 2.5* 2.5*   Recent Labs  Lab 08/15/20 0659  LIPASE 47   No results for input(s): AMMONIA in the last 168 hours. Coagulation Profile: Recent Labs  Lab 08/15/20 0659 08/15/20 1248 08/16/20 0036 08/16/20 0610 08/17/20 0225  INR 8.3* 2.1* 1.4* 1.4* 1.5*   Cardiac Enzymes: No results for input(s): CKTOTAL, CKMB, CKMBINDEX, TROPONINI in the last 168 hours. BNP (last 3 results) No results for input(s): PROBNP in the last 8760 hours. HbA1C: No results for input(s): HGBA1C in the last 72 hours. CBG: No results for input(s): GLUCAP in the last 168 hours. Lipid Profile: No results for input(s): CHOL, HDL, LDLCALC, TRIG, CHOLHDL, LDLDIRECT in the last 72 hours. Thyroid Function Tests: No results for input(s): TSH, T4TOTAL, FREET4, T3FREE, THYROIDAB in the last 72 hours. Anemia Panel: No results for input(s): VITAMINB12, FOLATE, FERRITIN, TIBC, IRON, RETICCTPCT in the last 72 hours. Sepsis Labs: Recent Labs  Lab 08/15/20 0912 08/15/20 1211 08/15/20 1830 08/15/20 2126  LATICACIDVEN 4.1* 2.8* 3.0* 1.1    Recent Results (from the past 240 hour(s))  Blood culture (routine single)     Status: None (Preliminary result)   Collection Time: 08/15/20  7:12 AM   Specimen: BLOOD  Result Value Ref Range Status   Specimen Description  BLOOD SITE NOT SPECIFIED  Final   Special Requests   Final    BOTTLES DRAWN AEROBIC AND ANAEROBIC Blood Culture results may not be optimal due to an inadequate volume of blood received in culture bottles   Culture   Final    NO GROWTH 2 DAYS Performed at [facility], [place] [address], [place], [place] [address]    Report Status PENDING  Incomplete  Resp Panel by RT-PCR (Flu A&B, Covid) Nasopharyngeal Swab     Status: None   Collection Time: 08/15/20  7:18 AM   Specimen: Nasopharyngeal Swab; Nasopharyngeal(NP) swabs in vial transport medium  Result Value Ref Range Status   SARS Coronavirus 2 by RT PCR NEGATIVE NEGATIVE Final    Comment: (NOTE) SARS-CoV-2 target nucleic acids are NOT DETECTED.  The SARS-CoV-2 RNA is generally detectable in upper respiratory specimens during the acute phase of infection. The lowest concentration of SARS-CoV-2 viral copies this assay can detect is 138 copies/mL. A negative result does not preclude SARS-Cov-2 infection and should not be used as the sole basis for treatment or other patient management decisions. A negative result may occur with  improper specimen collection/handling, submission of specimen other than nasopharyngeal swab, presence of viral mutation(s) within the areas targeted by this assay, and inadequate number of viral copies(<138 copies/mL). A negative result must be combined with clinical observations, patient history, and epidemiological information. The expected result is Negative.  Fact Sheet for Patients:  EntrepreneurPulse.com.au  Fact Sheet for Healthcare Providers:  IncredibleEmployment.be  This test is no t yet approved or cleared by the [place] FDA and  has been authorized for detection and/or diagnosis of SARS-CoV-2 by FDA under an Emergency Use Authorization (EUA). This EUA will remain  in effect (meaning this test can be used) for the duration of the COVID-19 declaration  under Section 564(b)(1) of the Act, 21 U.S.C.section 360bbb-3(b)(1), unless the authorization is terminated  or revoked sooner.       Influenza A by PCR NEGATIVE NEGATIVE Final   Influenza B by PCR NEGATIVE NEGATIVE Final    Comment: (NOTE) The Xpert Xpress SARS-CoV-2/FLU/RSV plus assay is intended as an aid in the diagnosis of influenza from Nasopharyngeal swab specimens and should not be used as a sole basis for treatment. Nasal washings and aspirates are unacceptable for Xpert Xpress SARS-CoV-2/FLU/RSV testing.  Fact Sheet for Patients: EntrepreneurPulse.com.au  Fact Sheet for Healthcare Providers: IncredibleEmployment.be  This test is not yet approved or cleared by the [place] FDA and has been authorized for detection and/or diagnosis of SARS-CoV-2 by FDA under an Emergency Use Authorization (EUA). This EUA will remain in effect (meaning this test can be used) for the duration of the COVID-19 declaration under Section 564(b)(1) of the Act, 21 U.S.C. section 360bbb-3(b)(1), unless the authorization is terminated or revoked.  Performed at [facility], [place] [address] [place], [place] [address]   Culture, blood (single)     Status: None (Preliminary result)   Collection Time: 08/15/20 10:50 AM   Specimen: BLOOD  Result Value Ref Range Status   Specimen Description BLOOD SITE NOT SPECIFIED  Final   Special Requests   Final    BOTTLES DRAWN AEROBIC AND ANAEROBIC Blood Culture adequate volume   Culture   Final    NO GROWTH 2 DAYS Performed at [facility], [address] [place], [place] [address]    Report Status PENDING  Incomplete     Radiology Studies: No results found.  Scheduled Meds: . sodium  chloride   Intravenous Once  . Chlorhexidine Gluconate Cloth  6 each Topical Q0600  . cinacalcet  60 mg Oral Q supper  . [START ON 08/18/2020] darbepoetin (ARANESP) injection - DIALYSIS  60 mcg Intravenous Q Sat-HD   . doxercalciferol  7 mcg Intravenous Q T,Th,Sa-HD  . [START ON 08/18/2020] pantoprazole  40 mg Intravenous Q12H  . sodium chloride flush  3 mL Intravenous Q12H   Continuous Infusions: . pantoprozole (PROTONIX) infusion 8 mg/hr (08/17/20 0000)     LOS: 2 days   Marylu Lund, MD [facility] Pager On Amion  If 7PM-7AM, please contact night-coverage 08/17/2020, 3:32 PM

## 2020-08-18 DIAGNOSIS — I10 Essential (primary) hypertension: Secondary | ICD-10-CM

## 2020-08-18 LAB — COMPREHENSIVE METABOLIC PANEL
ALT: 9 U/L (ref 0–44)
AST: 16 U/L (ref 15–41)
Albumin: 2.3 g/dL — ABNORMAL LOW (ref 3.5–5.0)
Alkaline Phosphatase: 71 U/L (ref 38–126)
Anion gap: 12 (ref 5–15)
BUN: 60 mg/dL — ABNORMAL HIGH (ref 8–23)
CO2: 25 mmol/L (ref 22–32)
Calcium: 8.4 mg/dL — ABNORMAL LOW (ref 8.9–10.3)
Chloride: 100 mmol/L (ref 98–111)
Creatinine, Ser: 7.11 mg/dL — ABNORMAL HIGH (ref 0.61–1.24)
GFR, Estimated: 8 mL/min — ABNORMAL LOW (ref >60)
Glucose, Bld: 96 mg/dL (ref 70–99)
Potassium: 4 mmol/L (ref 3.5–5.1)
Sodium: 137 mmol/L (ref 135–145)
Total Bilirubin: 0.9 mg/dL (ref 0.3–1.2)
Total Protein: 5.1 g/dL — ABNORMAL LOW (ref 6.5–8.1)

## 2020-08-18 LAB — CBC
HCT: 23 % — ABNORMAL LOW (ref 39.0–52.0)
Hemoglobin: 7.5 g/dL — ABNORMAL LOW (ref 13.0–17.0)
MCH: 29 pg (ref 26.0–34.0)
MCHC: 32.6 g/dL (ref 30.0–36.0)
MCV: 88.8 fL (ref 80.0–100.0)
Platelets: 211 10*3/uL (ref 150–400)
RBC: 2.59 MIL/uL — ABNORMAL LOW (ref 4.22–5.81)
RDW: 16.3 % — ABNORMAL HIGH (ref 11.5–15.5)
WBC: 12.9 10*3/uL — ABNORMAL HIGH (ref 4.0–10.5)
nRBC: 0 % (ref 0.0–0.2)

## 2020-08-18 MED ORDER — DARBEPOETIN ALFA 60 MCG/0.3ML IJ SOSY
PREFILLED_SYRINGE | INTRAMUSCULAR | Status: AC
  Filled 2020-08-18: qty 0.3

## 2020-08-18 MED ORDER — DOXERCALCIFEROL 4 MCG/2ML IV SOLN
INTRAVENOUS | Status: AC
  Filled 2020-08-18: qty 4

## 2020-08-18 NOTE — Progress Notes (Signed)
PROGRESS NOTE    Jason NOSBISCH Sr.  VVO:[id number] DOB: October 23, 1945 DOA: 08/15/2020 PCP: [name], MD    Brief Narrative:  75 y.o. male with medical history significant of atrial fibrillation on Coumadin, ESRD on HD (TTS), hypertension, diarrhea, CVA, stage IV rectal cancer s/p chemotherapy, radiation, laparoscopic low anterior resection with diverting loop ileostomy and subsequent ileostomy takedown presented with complaints of abdominal pain and diarrhea.  Patient reported that over the last 4 days he had been having dark stools and intermittent epigastric abdominal pain.  He chronically has diarrhea for which he has been followed by [facility] gastroenterology.  Patient reported associated symptoms of nausea, vomiting, right leg leg swelling, and pain.  Emesis was also noted to be dark in color. He is followed up by his primary care provider 2 days ago and x-rays revealed concern for a healing nondisplaced fracture of the mid fibula.    Assessment & Plan:   Principal Problem:   GI bleed Active Problems:   Essential hypertension   Chronic HFrEF (heart failure with reduced ejection fraction) (HCC)   ESRD on dialysis (HCC)   Paroxysmal atrial fibrillation (HCC)   Coagulopathy (HCC)   Leg edema, right   Acute blood loss anemia   Supratherapeutic INR   Fibula fracture   Acute gastric ulcer with hemorrhage  Acute blood loss anemia secondary to GI bleed:  -Patient presents with complaints of dark stools for the last 4 days prior to visit -Stool guaiacs were noted to be positive and hemoglobin acutely dropped from 11.7 g/dL down to 7.1 g/dL today.   -GI consulted and pt underwent EGD on 2/9 with findings of ulceration through the pyloric channel into the duodenal bulb s/p epinephrine injection and clipping -Pt is continued on protonix gtt, to change to IV protonix BID on 2/12 -GI recs for advancing diet, now on a regular diet. Per GI if stable over weekend and after completing IV PPI, can  d/c from GI standpoint -This AM, hgb is slightly lower at 7.5 from 7.9 -Will repeat CBC in AM. If hgb remains stable in AM, consider possible d/c then  Transient hypotension secondary to hemorrhagic shock: Acute.   On admission blood pressure as low as 64/45 with improvement after initial fluid boluses.  Suspected secondary to acute blood loss.  Presently remains hemodynamically stable  Paroxysmal atrial fibrillation with supratherapeutic INR:  -On admission INR elevated at 8.3. He had been recently on antibiotics to treat for a right leg cellulitis which likely resulted in elevated INR.  Due to acute bleed patient was given 1 unit of FFP and 10 mg of vitamin K IV. -Cont to hold anticoagulation. Per GI, can resume next week (in 3-5 days) and keep INR at lowest end of acceptable range  SIRS lactic acid: Acute, sepsis ruled out.  -On admission patient noted to be tachycardic with initial WBC elevated at 26.2 and lactic acid initially 4.1.  Suspect symptoms could be likely related to acute blood loss.   -Pt was started on empiric antibiotics of metronidazole and cefepime -Blood cx neg thus far -CXR reviewed, clear -Pt has remained afebrile. Denies fevers, chills prior to visit -WBC had trended down -Given no source of infection, had discontinued further abx  Nondisplaced right fibula fracture:  -Patient reporting right leg swelling after accidentally hitting his leg against a table home almost 2 weeks ago.  He had been treated for concern for cellulitis with doxycycline.  X-rays from 2/7 revealed the likely fracture.  No significant erythema appreciated at this time patient's primary care provider had placed a referral to orthopedics. -Confirmed with Orthopedic Surgery. Ortho recommendations to cont with cam boot with weightbearing as tolerated. Have consulted PT  Diarrhea: Acute on chronic.   -Patient reported that his last bowel movement was prior to coming in yesterday, but have been  on antibiotics recently.  He notes that he has had issues with diarrhea ever since his prior surgery for the rectal cancer in 2014 -Remains stable at present  History of rectal cancer: Patient initially diagnosed in 2014 with rectal cancer s/p chemotherapy, radiation, laparoscopic low anterior resection with diverting loop ileostomy and subsequent ileostomy takedown.  Pathology revealed invasive adenocarcinoma.  Ever since that surgery patient reports that he has had diarrhea. Cont follow. Continue with imodium as needed  ESRD on HD:  -Patient normally dialyzes Tuesday, Thursday, and Saturday. -Nephrology is following -Cont hd as per nephrology  Heart failure with preserved EF:  -Last EF noted to be 50-55% based off echocardiogram from 12/23/2019 with mild mitral regurgitation, tricuspid regurgitation, and pulmonic regurgitation, improved from prior EF of 20- 25% based off echocardiogram from 06/2018. -Cont with volume management per Nephrology  DVT prophylaxis: SCD's Code Status: Full Family Communication: Pt in room, family not at bedside  Status is: Inpatient  Remains inpatient appropriate because:IV treatments appropriate due to intensity of illness or inability to take PO and Inpatient level of care appropriate due to severity of illness  Dispo: The patient is from: Home              Anticipated d/c is to: Home              Anticipated d/c date is: 3 days              Patient currently is not medically stable to d/c.   Difficult to place patient No   Consultants:   GI  Procedures:   EGD 2/9  Antimicrobials: Anti-infectives (From admission, onward)   Start     Dose/Rate Route Frequency Ordered Stop   08/16/20 1800  ceFEPIme (MAXIPIME) 1 g in sodium chloride 0.9 % 100 mL IVPB  Status:  Discontinued        1 g 200 mL/hr over 30 Minutes Intravenous Every 24 hours 08/15/20 1234 08/17/20 0748   08/16/20 1200  ceFEPIme (MAXIPIME) 1 g in sodium chloride 0.9 % 100 mL IVPB   Status:  Discontinued        1 g 200 mL/hr over 30 Minutes Intravenous Every 24 hours 08/15/20 1225 08/15/20 1234   08/15/20 2000  metroNIDAZOLE (FLAGYL) IVPB 500 mg  Status:  Discontinued        500 mg 100 mL/hr over 60 Minutes Intravenous Every 8 hours 08/15/20 1357 08/17/20 0748   08/15/20 1100  ceFEPIme (MAXIPIME) 2 g in sodium chloride 0.9 % 100 mL IVPB        2 g 200 mL/hr over 30 Minutes Intravenous  Once 08/15/20 1050 08/15/20 1248   08/15/20 1100  metroNIDAZOLE (FLAGYL) IVPB 500 mg        500 mg 100 mL/hr over 60 Minutes Intravenous  Once 08/15/20 1050 08/15/20 1424      Subjective: No complaints this AM. Pt seen on HD  Objective: Vitals:   08/18/20 1400 08/18/20 1440 08/18/20 1450 08/18/20 1621  BP: 140/77 (!) 144/71 (!) 153/83 (!) 158/84  Pulse:    (!) 101  Resp: 12 12  19   Temp:   Marland Kitchen)  97.4 F (36.3 C)   TempSrc:   Oral   SpO2:  100%    Weight:  60 kg    Height:        Intake/Output Summary (Last 24 hours) at 08/18/2020 1645 Last data filed at 08/18/2020 1440 Gross per 24 hour  Intake 264.65 ml  Output 100 ml  Net 164.65 ml   Filed Weights   08/18/20 0500 08/18/20 1200 08/18/20 1440  Weight: 57.5 kg 60 kg 60 kg    Examination: General exam: Conversant, in no acute distress Respiratory system: normal chest rise, clear, no audible wheezing Cardiovascular system: regular rhythm, s1-s2 Gastrointestinal system: Nondistended, nontender, pos BS Central nervous system: No seizures, no tremors Extremities: No cyanosis, no joint deformities Skin: No rashes, no pallor Psychiatry: Affect normal // no auditory hallucinations   Data Reviewed: I have personally reviewed following labs and imaging studies  CBC: Recent Labs  Lab 08/15/20 0659 08/15/20 0725 08/15/20 1248 08/16/20 0205 08/16/20 0620 08/16/20 1341 08/17/20 0225 08/18/20 0423  WBC 25.0* 26.2*  --   --  13.4*  --  11.1* 12.9*  NEUTROABS 21.9* 23.4*  --   --   --   --   --   --   HGB 7.0* 7.1*  7.2* 7.8* 8.0* 8.4* 7.9* 7.5*  HCT 21.3* 22.1* 21.0* 23.3* 24.3*  --  23.5* 23.0*  MCV 93.4 93.6  --   --  88.0  --  86.7 88.8  PLT 308 258  --   --  164  --  170 800   Basic Metabolic Panel: Recent Labs  Lab 08/15/20 0659 08/15/20 0725 08/16/20 0620 08/17/20 0225 08/18/20 0423  NA 142 143 140 140 137  K 4.3 4.7 4.8 3.8 4.0  CL 92* 94* 96* 97* 100  CO2 24 23 22 27 25   GLUCOSE 99 91 62* 60* 96  BUN 73* 72* 104* 52* 60*  CREATININE 6.83* 6.83* 7.37* 5.63* 7.11*  CALCIUM 9.4 9.5 9.5 9.1 8.4*  PHOS  --   --  7.2*  --   --    GFR: Estimated Creatinine Clearance: 7.9 mL/min (A) (by C-G formula based on SCr of 7.11 mg/dL (H)). Liver Function Tests: Recent Labs  Lab 08/15/20 0659 08/15/20 0725 08/16/20 0620 08/17/20 0225 08/18/20 0423  AST 28 25  --  19 16  ALT 11 11  --  11 9  ALKPHOS 88 88  --  72 71  BILITOT 1.2 1.4*  --  1.3* 0.9  PROT 6.2* 6.3*  --  5.5* 5.1*  ALBUMIN 2.9* 3.1* 2.5* 2.5* 2.3*   Recent Labs  Lab 08/15/20 0659  LIPASE 47   No results for input(s): AMMONIA in the last 168 hours. Coagulation Profile: Recent Labs  Lab 08/15/20 0659 08/15/20 1248 08/16/20 0036 08/16/20 0610 08/17/20 0225  INR 8.3* 2.1* 1.4* 1.4* 1.5*   Cardiac Enzymes: No results for input(s): CKTOTAL, CKMB, CKMBINDEX, TROPONINI in the last 168 hours. BNP (last 3 results) No results for input(s): PROBNP in the last 8760 hours. HbA1C: No results for input(s): HGBA1C in the last 72 hours. CBG: No results for input(s): GLUCAP in the last 168 hours. Lipid Profile: No results for input(s): CHOL, HDL, LDLCALC, TRIG, CHOLHDL, LDLDIRECT in the last 72 hours. Thyroid Function Tests: No results for input(s): TSH, T4TOTAL, FREET4, T3FREE, THYROIDAB in the last 72 hours. Anemia Panel: No results for input(s): VITAMINB12, FOLATE, FERRITIN, TIBC, IRON, RETICCTPCT in the last 72 hours. Sepsis Labs: Recent Labs  Lab 08/15/20 0912 08/15/20 1211 08/15/20 1830 08/15/20 2126   LATICACIDVEN 4.1* 2.8* 3.0* 1.1    Recent Results (from the past 240 hour(s))  Blood culture (routine single)     Status: None (Preliminary result)   Collection Time: 08/15/20  7:12 AM   Specimen: BLOOD  Result Value Ref Range Status   Specimen Description BLOOD SITE NOT SPECIFIED  Final   Special Requests   Final    BOTTLES DRAWN AEROBIC AND ANAEROBIC Blood Culture results may not be optimal due to an inadequate volume of blood received in culture bottles   Culture   Final    NO GROWTH 2 DAYS Performed at [facility], [place] [address], [place], [place] [address]    Report Status PENDING  Incomplete  Resp Panel by RT-PCR (Flu A&B, Covid) Nasopharyngeal Swab     Status: None   Collection Time: 08/15/20  7:18 AM   Specimen: Nasopharyngeal Swab; Nasopharyngeal(NP) swabs in vial transport medium  Result Value Ref Range Status   SARS Coronavirus 2 by RT PCR NEGATIVE NEGATIVE Final    Comment: (NOTE) SARS-CoV-2 target nucleic acids are NOT DETECTED.  The SARS-CoV-2 RNA is generally detectable in upper respiratory specimens during the acute phase of infection. The lowest concentration of SARS-CoV-2 viral copies this assay can detect is 138 copies/mL. A negative result does not preclude SARS-Cov-2 infection and should not be used as the sole basis for treatment or other patient management decisions. A negative result may occur with  improper specimen collection/handling, submission of specimen other than nasopharyngeal swab, presence of viral mutation(s) within the areas targeted by this assay, and inadequate number of viral copies(<138 copies/mL). A negative result must be combined with clinical observations, patient history, and epidemiological information. The expected result is Negative.  Fact Sheet for Patients:  EntrepreneurPulse.com.au  Fact Sheet for Healthcare Providers:  IncredibleEmployment.be  This test is no t yet approved or  cleared by the [place] FDA and  has been authorized for detection and/or diagnosis of SARS-CoV-2 by FDA under an Emergency Use Authorization (EUA). This EUA will remain  in effect (meaning this test can be used) for the duration of the COVID-19 declaration under Section 564(b)(1) of the Act, 21 U.S.C.section 360bbb-3(b)(1), unless the authorization is terminated  or revoked sooner.       Influenza A by PCR NEGATIVE NEGATIVE Final   Influenza B by PCR NEGATIVE NEGATIVE Final    Comment: (NOTE) The Xpert Xpress SARS-CoV-2/FLU/RSV plus assay is intended as an aid in the diagnosis of influenza from Nasopharyngeal swab specimens and should not be used as a sole basis for treatment. Nasal washings and aspirates are unacceptable for Xpert Xpress SARS-CoV-2/FLU/RSV testing.  Fact Sheet for Patients: EntrepreneurPulse.com.au  Fact Sheet for Healthcare Providers: IncredibleEmployment.be  This test is not yet approved or cleared by the [place] FDA and has been authorized for detection and/or diagnosis of SARS-CoV-2 by FDA under an Emergency Use Authorization (EUA). This EUA will remain in effect (meaning this test can be used) for the duration of the COVID-19 declaration under Section 564(b)(1) of the Act, 21 U.S.C. section 360bbb-3(b)(1), unless the authorization is terminated or revoked.  Performed at [facility], [place] [address] [place], [place] [address]   Culture, blood (single)     Status: None (Preliminary result)   Collection Time: 08/15/20 10:50 AM   Specimen: BLOOD  Result Value Ref Range Status   Specimen Description BLOOD SITE NOT SPECIFIED  Final  Special Requests   Final    BOTTLES DRAWN AEROBIC AND ANAEROBIC Blood Culture adequate volume   Culture   Final    NO GROWTH 2 DAYS Performed at [facility], [place] [address], [place], [place] [address]    Report Status PENDING  Incomplete     Radiology  Studies: No results found.  Scheduled Meds: . sodium chloride   Intravenous Once  . Chlorhexidine Gluconate Cloth  6 each Topical Q0600  . cinacalcet  60 mg Oral Q supper  . Darbepoetin Alfa      . darbepoetin (ARANESP) injection - DIALYSIS  60 mcg Intravenous Q Sat-HD  . doxercalciferol      . doxercalciferol  7 mcg Intravenous Q T,Th,Sa-HD  . pantoprazole  40 mg Intravenous Q12H  . sodium chloride flush  3 mL Intravenous Q12H   Continuous Infusions:    LOS: 3 days   Marylu Lund, MD [facility] Pager On Amion  If 7PM-7AM, please contact night-coverage 08/18/2020, 4:45 PM

## 2020-08-18 NOTE — Progress Notes (Signed)
[facility] Progress Note  Subjective: seen in room, no c/o  Vitals:   08/18/20 1230 08/18/20 1300 08/18/20 1330 08/18/20 1400  BP: 138/68 135/73 129/70 140/77  Pulse:      Resp: 12 12 11 12   Temp:      TempSrc:      SpO2:      Weight:      Height:        Exam:   alert, nad   no jvd  Chest cta bilat  Cor reg no RG  Abd soft ntnd no ascites   Ext no LE edema   Alert, NF, ox3   LUA AVF+bruit    OP HD: Home meds:  - bidil tid / metoprolol xl 25 qd  - warfarin qd  - oxy IR qid prn/ neurontin 100 tid  - lomotil qid/ creon ac tid/ prn zofran/ fibercon  - zyloprim 100 / lipitor 20/ sensipar 60 qd  - prn's/ vitamins/ supplements    OP HD: [place] TTS  4h  3K/2Ca bath  62kg (coming off under) Hep none  LUA AVF  - hect 7 ug  - venofer 50 q wk  - mircera 50 q4 wks, last 1/25   Assessment/ Plan: 1. GI bleed - pt on nsaids at home. EGD showed large pyloric channel ulcer w/ active bleeding, treated w/ clip+ injection.  PPI. Holding coumadin for now.  2. ESRD - usual HD TTS. Next HD today.  3. H/o CVA 4. Atrial fib - on coumadin, high INR > reversed 5. Hypotension - resolved 6. Volume - LE edema resolved. New dry wt around 58kg.  7. HTN - BB and bidil on hold, BP's good 8. COPD 9. Anemia CKD + GIB - next esa due , ordered darbe 60 ug w/ HD 2/12 10. MBD ckd - cont meds      Rob Lorann Tani 08/18/2020, 3:11 PM   Recent Labs  Lab 08/16/20 0620 08/16/20 1341 08/17/20 0225 08/18/20 0423  K 4.8  --  3.8 4.0  BUN 104*  --  52* 60*  CREATININE 7.37*  --  5.63* 7.11*  CALCIUM 9.5  --  9.1 8.4*  PHOS 7.2*  --   --   --   HGB 8.0*   < > 7.9* 7.5*   < > = values in this interval not displayed.   Inpatient medications: . sodium chloride   Intravenous Once  . Chlorhexidine Gluconate Cloth  6 each Topical Q0600  . cinacalcet  60 mg Oral Q supper  . Darbepoetin Alfa      . darbepoetin (ARANESP) injection - DIALYSIS  60 mcg Intravenous Q Sat-HD  .  doxercalciferol      . doxercalciferol  7 mcg Intravenous Q T,Th,Sa-HD  . pantoprazole  40 mg Intravenous Q12H  . sodium chloride flush  3 mL Intravenous Q12H    acetaminophen **OR** acetaminophen, loperamide, ondansetron **OR** ondansetron (ZOFRAN) IV

## 2020-08-18 NOTE — Evaluation (Signed)
Physical Therapy Evaluation Patient Details Name: Jason POMPEI Sr. MRN: [id number] DOB: January 16, 1946 Today's Date: 08/18/2020   History of Present Illness  75yo male c/o dark stools and epigastric pain. Also recently struck a table with his R LE sustatining a fibular fracture. Admitted with acute blood loss anemia secondary to GIB, transient hypotension, A-fib, and SIRS. PMH A-fib, chronic renal insufficiency, COPD, CVA, DOE, gout, peripheral neuropathy, rectal CA, partial lung lobectomy, thoracotomy  Clinical Impression   Patient received in bed, pleasant and cooperative with therapy. Able to mobilize on a min guard basis with RW, but did need totalA for donning/doffing CAM boot as well as education on WBAT precautions R LE today. Had dizziness 6/10 with mobility, but VSS on RA- question possible vertigo? Going to HD soon after our session, so left in bed with all needs met and bed alarm active. Feel he will do well with skilled HHPT and assist from family at DC.     Follow Up Recommendations Home health PT;Supervision for mobility/OOB    Equipment Recommendations  Rolling walker with 5" wheels;3in1 (PT)    Recommendations for Other Services       Precautions / Restrictions Precautions Precautions: Fall;Other (comment) Precaution Comments: peripheral neuropathy, WBAT R LE in CAM boot Restrictions Weight Bearing Restrictions: Yes RLE Weight Bearing: Weight bearing as tolerated Other Position/Activity Restrictions: WBAT in CAM boot      Mobility  Bed Mobility Overal bed mobility: Needs Assistance Bed Mobility: Supine to Sit;Sit to Supine     Supine to sit: Min guard Sit to supine: Min guard   General bed mobility comments: increased time and effort    Transfers Overall transfer level: Needs assistance Equipment used: Rolling walker (2 wheeled) Transfers: Sit to/from Stand Sit to Stand: Min guard         General transfer comment: min guard and increased time, cues for  hand placement and sequencing  Ambulation/Gait Ambulation/Gait assistance: Min guard Gait Distance (Feet): 20 Feet Assistive device: Rolling walker (2 wheeled) Gait Pattern/deviations: Step-to pattern;Decreased step length - right;Decreased step length - left;Decreased weight shift to right;Trunk flexed Gait velocity: decreased   General Gait Details: mildly off balance due to leg length discrepancy between CAM boot and sock on the other foot; min guard for safety and steadying with RW  Stairs            Wheelchair Mobility    Modified Rankin (Stroke Patients Only)       Balance Overall balance assessment: Needs assistance Sitting-balance support: Feet supported;No upper extremity supported Sitting balance-Leahy Scale: Good     Standing balance support: Bilateral upper extremity supported;During functional activity Standing balance-Leahy Scale: Poor Standing balance comment: reliant on BUE support, history of peripheral neuropathy                             Pertinent Vitals/Pain Pain Assessment: No/denies pain    Home Living Family/patient expects to be discharged to:: Private residence Living Arrangements: Spouse/significant other Available Help at Discharge: Family;Available 24 hours/day Type of Home: House Home Access: Level entry     Home Layout: One level Home Equipment: Walker - 2 wheels      Prior Function Level of Independence: Independent with assistive device(s)               Hand Dominance        Extremity/Trunk Assessment   Upper Extremity Assessment Upper Extremity Assessment: Defer to OT evaluation  Lower Extremity Assessment Lower Extremity Assessment: Generalized weakness    Cervical / Trunk Assessment Cervical / Trunk Assessment: Normal  Communication   Communication: No difficulties  Cognition Arousal/Alertness: Awake/alert Behavior During Therapy: Flat affect Overall Cognitive Status: No family/caregiver  present to determine baseline cognitive functioning Area of Impairment: Safety/judgement;Problem solving;Awareness                         Safety/Judgement: Decreased awareness of deficits Awareness: Emergent Problem Solving: Slow processing;Decreased initiation;Requires verbal cues General Comments: cooperative and pleasant, but with slow processing and does require verbal cues and reminders of what we are doing next      General Comments General comments (skin integrity, edema, etc.): VSS on RA    Exercises     Assessment/Plan    PT Assessment Patient needs continued PT services  PT Problem List Decreased strength;Decreased cognition;Decreased knowledge of use of DME;Decreased activity tolerance;Decreased safety awareness;Decreased balance;Decreased knowledge of precautions;Decreased mobility;Decreased coordination       PT Treatment Interventions DME instruction;Balance training;Gait training;Stair training;Cognitive remediation;Functional mobility training;Patient/family education;Therapeutic activities;Therapeutic exercise    PT Goals (Current goals can be found in the Care Plan section)  Acute Rehab PT Goals Patient Stated Goal: go home when medically ready PT Goal Formulation: With patient Time For Goal Achievement: 09/01/20 Potential to Achieve Goals: Good    Frequency Min 3X/week   Barriers to discharge        Co-evaluation               AM-PAC PT "6 Clicks" Mobility  Outcome Measure Help needed turning from your back to your side while in a flat bed without using bedrails?: A Little Help needed moving from lying on your back to sitting on the side of a flat bed without using bedrails?: A Little Help needed moving to and from a bed to a chair (including a wheelchair)?: A Little Help needed standing up from a chair using your arms (e.g., wheelchair or bedside chair)?: A Little Help needed to walk in hospital room?: A Little Help needed climbing  3-5 steps with a railing? : A Lot 6 Click Score: 17    End of Session   Activity Tolerance: Patient tolerated treatment well Patient left: in bed;with call bell/phone within reach;with bed alarm set Nurse Communication: Mobility status PT Visit Diagnosis: Unsteadiness on feet (R26.81);Difficulty in walking, not elsewhere classified (R26.2);Pain;Muscle weakness (generalized) (M62.81) Pain - Right/Left: Right Pain - part of body: Leg    Time: 6712-4580 PT Time Calculation (min) (ACUTE ONLY): 26 min   Charges:   PT Evaluation $PT Eval Moderate Complexity: 1 Mod PT Treatments $Gait Training: 8-22 mins        Windell [place], DPT, PN1   Supplemental Physical Therapist [place]    Pager [phone] Acute Rehab Office [phone]

## 2020-08-19 ENCOUNTER — Encounter (HOSPITAL_COMMUNITY): Payer: Self-pay | Admitting: Gastroenterology

## 2020-08-19 DIAGNOSIS — N186 End stage renal disease: Secondary | ICD-10-CM

## 2020-08-19 DIAGNOSIS — I5022 Chronic systolic (congestive) heart failure: Secondary | ICD-10-CM

## 2020-08-19 DIAGNOSIS — Z992 Dependence on renal dialysis: Secondary | ICD-10-CM

## 2020-08-19 DIAGNOSIS — K922 Gastrointestinal hemorrhage, unspecified: Secondary | ICD-10-CM

## 2020-08-19 LAB — CBC
HCT: 23.2 % — ABNORMAL LOW (ref 39.0–52.0)
Hemoglobin: 7.7 g/dL — ABNORMAL LOW (ref 13.0–17.0)
MCH: 29.7 pg (ref 26.0–34.0)
MCHC: 33.2 g/dL (ref 30.0–36.0)
MCV: 89.6 fL (ref 80.0–100.0)
Platelets: 202 10*3/uL (ref 150–400)
RBC: 2.59 MIL/uL — ABNORMAL LOW (ref 4.22–5.81)
RDW: 16.3 % — ABNORMAL HIGH (ref 11.5–15.5)
WBC: 13.3 10*3/uL — ABNORMAL HIGH (ref 4.0–10.5)
nRBC: 0 % (ref 0.0–0.2)

## 2020-08-19 MED ORDER — PANTOPRAZOLE SODIUM 40 MG PO TBEC
40.0000 mg | DELAYED_RELEASE_TABLET | Freq: Two times a day (BID) | ORAL | Status: DC
  Administered 2020-08-19: 40 mg via ORAL
  Filled 2020-08-19: qty 1

## 2020-08-19 MED ORDER — PANTOPRAZOLE SODIUM 40 MG PO TBEC: 40.0000 mg | DELAYED_RELEASE_TABLET | Freq: Two times a day (BID) | ORAL | 1 refills | Status: DC

## 2020-08-19 MED ORDER — WARFARIN SODIUM 5 MG PO TABS: ORAL_TABLET | ORAL | Status: DC

## 2020-08-19 NOTE — TOC Transition Note (Addendum)
Transition of Care [facility]) - CM/SW Discharge Note   Patient Details  Name: GAYLON BENTZ Sr. MRN: [id number] Date of Birth: Mar 13, 1946  Transition of Care [facility]) CM/SW Contact:  Carles Collet, RN Phone Number: 08/19/2020, 11:16 AM   Clinical Narrative:    Damaris Schooner w patient at bedside. He is agreeable to [facility] services, states he used [facility] in the past and would like to again. He has RW at home, no other DME needs identified. Wife will provide transportation home.  Referral pending to [facility]- declined  [facility] Accepted    Final next level of care: Home w Home Health Services Barriers to Discharge: No Barriers Identified   Patient Goals and CMS Choice Patient states their goals for this hospitalization and ongoing recovery are:: to go home CMS Medicare.gov Compare Post Acute Care list provided to:: Patient Choice offered to / list presented to : Patient  Discharge Placement                       Discharge Plan and Services                          HH Arranged: PT [facility] Agency: [place] ([facility]) Date [place]: 08/19/20 Time [place]: 1007 Representative spoke with at [place]: [place] ([place]) Interventions     Readmission Risk Interventions No flowsheet data found.

## 2020-08-19 NOTE — Discharge Summary (Signed)
Physician Discharge Summary  Jason OHLIN Sr. EYC:[id number] DOB: 1945/09/13 DOA: 08/15/2020  PCP: [name], MD  Admit date: 08/15/2020 Discharge date: 08/19/2020  Admitted From: home Discharge disposition: home   Recommendations for Outpatient Follow-Up:   1. Cbc 1 week 2. Coumadin restart on Wednesday 3. Home health   Discharge Diagnosis:   Principal Problem:   GI bleed Active Problems:   Essential hypertension   Chronic HFrEF (heart failure with reduced ejection fraction) (HCC)   ESRD on dialysis (HCC)   Paroxysmal atrial fibrillation (HCC)   Coagulopathy (HCC)   Leg edema, right   Acute blood loss anemia   Supratherapeutic INR   Fibula fracture   Acute gastric ulcer with hemorrhage    Discharge Condition: Improved.  Diet recommendation: Low sodium, heart healthy  Wound care: None.  Code status: Full.   History of Present Illness:   Jason ZWIEFELHOFER Sr. is a 75 y.o. male with medical history significant of atrial fibrillation on Coumadin, ESRD on HD (TTS), hypertension, diarrhea, CVA, stage IV rectal cancer s/p chemotherapy, radiation, laparoscopic low anterior resection with diverting loop ileostomy and subsequent ileostomy takedown presented with complaints of abdominal pain and diarrhea.  Patient reported that over the last 4 days he had been having dark stools and intermittent epigastric abdominal pain.  He chronically has diarrhea for which he has been followed by [facility] gastroenterology.  Patient reported associated symptoms of nausea, vomiting, right leg leg swelling, and pain.  Emesis was also noted to be dark in color.  He reports that he had his last episode of diarrhea this morning and has not had any vomiting since yesterday  A little bit over 1 week ago he had been seen by his primary care provider after he accidentally struck a table with his right leg sustaining a wound.  He was started on doxycycline to treat for what was reported to be a  cellulitis.  Patient had been taking the medication as prescribed and reports that he had 4-5 more tablets left to take.  Due to the pain he had been taking ibuprofen twice daily.  He is followed up by his primary care provider 2 days ago and x-rays revealed concern for a healing nondisplaced fracture of the mid fibula.      Hospital Course by Problem:   Acute blood loss anemia secondary toGI bleed:  -Patient presents with complaints of dark stools for the last 4 days prior to visit -Stool guaiacs were noted to be positive and hemoglobin acutely dropped from 11.7 g/dL down to 7.1 g/dL today.  -GI consulted and pt underwent EGD on 2/9 with findings of ulceration through the pyloric channel into the duodenal bulb s/p epinephrine injection and clipping -protonix BID until EGD repeated -hgb stable  Transient hypotension secondary to hemorrhagic shock: Acute.  On admission blood pressure as low as 64/45 with improvement after initial fluid boluses. Suspected secondary to acute blood loss.  -holding BP meds  Paroxysmal atrial fibrillation with supratherapeutic INR:  -On admission INR elevated at 8.3. He had been recently on antibiotics to treat for a right leg cellulitis which likely resulted in elevated INR. Due to acute bleed patient was given 1 unit of FFP and 10 mg of vitamin K IV. -Cont to hold anticoagulation. Per GI, can resume next week (16th) and keep INR at lowest end of acceptable range  SIRSlactic acid: Acute, sepsis ruled out.  -On admission patient noted to be tachycardic with initial  WBC elevated at 26.2 and lactic acid initially 4.1. Suspect symptoms could be likely related to acute blood loss.  -Pt was started on empiric antibiotics of metronidazole and cefepime -Blood cx neg thus far -CXR reviewed, clear -Pt has remained afebrile. Denies fevers, chills prior to visit -WBC had trended down -Given no source of infection, had discontinued further abx  Nondisplaced  right fibula fracture:  -Patient reporting right leg swelling after accidentally hitting his leg against a table home almost 2 weeks ago. He had been treated for concern for cellulitis with doxycycline. X-rays from 2/7 revealed the likely fracture. No significant erythema appreciated at this time patient's primary care provider had placed a referral to orthopedics. -Confirmed with Orthopedic Surgery. Ortho recommendations to cont with cam boot with weightbearing as tolerated. Have consulted PT  Diarrhea: Acute on chronic.  -Patient reported that his last bowel movement was prior to coming in yesterday, but have been on antibiotics recently. He notes that he has had issues with diarrhea ever since his prior surgery for the rectal cancer in 2014 -Remains stable at present  History of rectal cancer: Patient initially diagnosed in 2014 withrectal cancers/pchemotherapy, radiation, laparoscopic low anterior resection with diverting loop ileostomy and subsequent ileostomy takedown. Pathology revealed invasive adenocarcinoma. Ever since that surgery patient reports that he has had diarrhea. Cont follow. Continue with imodium as needed  ESRD on HD:  -Patient normally dialyzes Tuesday, Thursday, and Saturday. -Nephrology is following -Cont hd as per nephrology  Heart failure with preserved EF:  -Last EF noted to be 50-55% based off echocardiogram from 12/23/2019 with mild mitral regurgitation, tricuspid regurgitation, and pulmonic regurgitation, improved from prior EF of 20-25% based off echocardiogram from 06/2018. -Cont with volume management per Nephrology     Medical Consultants:   Renal GI   Discharge Exam:   Vitals:   08/19/20 0545 08/19/20 0811  BP: 125/65 122/64  Pulse: 77 79  Resp: 11 16  Temp: 97.9 F (36.6 C) 98.9 F (37.2 C)  SpO2: 98% 100%   Vitals:   08/18/20 2157 08/19/20 0454 08/19/20 0545 08/19/20 0811  BP: 126/63  125/65 122/64  Pulse: 86  77 79   Resp: 15  11 16   Temp: 97.8 F (36.6 C)  97.9 F (36.6 C) 98.9 F (37.2 C)  TempSrc: Oral  Oral Oral  SpO2: 99%  98% 100%  Weight:  57.6 kg    Height:        General exam: Appears calm and comfortable.    The results of significant diagnostics from this hospitalization (including imaging, microbiology, ancillary and laboratory) are listed below for reference.     Procedures and Diagnostic Studies:   DG Chest Port 1 View  Result Date: 08/15/2020 CLINICAL DATA:  Shortness of breath. EXAM: PORTABLE CHEST 1 VIEW COMPARISON:  04/08/2019 FINDINGS: 0748 hours. The lungs are clear without focal pneumonia, edema, pneumothorax or pleural effusion. Skin folds project over each upper hemithorax. The cardiopericardial silhouette is within normal limits for size. Vascular stent device noted left subclavian region. Telemetry leads overlie the chest. IMPRESSION: No active disease. Electronically Signed   By: Misty Stanley M.D.   On: 08/15/2020 08:03     Labs:   Basic Metabolic Panel: Recent Labs  Lab 08/15/20 0659 08/15/20 0725 08/16/20 0620 08/17/20 0225 08/18/20 0423  NA 142 143 140 140 137  K 4.3 4.7 4.8 3.8 4.0  CL 92* 94* 96* 97* 100  CO2 24 23 22 27 25   GLUCOSE 99 91  62* 60* 96  BUN 73* 72* 104* 52* 60*  CREATININE 6.83* 6.83* 7.37* 5.63* 7.11*  CALCIUM 9.4 9.5 9.5 9.1 8.4*  PHOS  --   --  7.2*  --   --    GFR Estimated Creatinine Clearance: 7.5 mL/min (A) (by C-G formula based on SCr of 7.11 mg/dL (H)). Liver Function Tests: Recent Labs  Lab 08/15/20 0659 08/15/20 0725 08/16/20 0620 08/17/20 0225 08/18/20 0423  AST 28 25  --  19 16  ALT 11 11  --  11 9  ALKPHOS 88 88  --  72 71  BILITOT 1.2 1.4*  --  1.3* 0.9  PROT 6.2* 6.3*  --  5.5* 5.1*  ALBUMIN 2.9* 3.1* 2.5* 2.5* 2.3*   Recent Labs  Lab 08/15/20 0659  LIPASE 47   No results for input(s): AMMONIA in the last 168 hours. Coagulation profile Recent Labs  Lab 08/15/20 0659 08/15/20 1248 08/16/20 0036  08/16/20 0610 08/17/20 0225  INR 8.3* 2.1* 1.4* 1.4* 1.5*    CBC: Recent Labs  Lab 08/15/20 0659 08/15/20 0725 08/15/20 1248 08/16/20 0205 08/16/20 0620 08/16/20 1341 08/17/20 0225 08/18/20 0423 08/19/20 0322  WBC 25.0* 26.2*  --   --  13.4*  --  11.1* 12.9* 13.3*  NEUTROABS 21.9* 23.4*  --   --   --   --   --   --   --   HGB 7.0* 7.1*   < > 7.8* 8.0* 8.4* 7.9* 7.5* 7.7*  HCT 21.3* 22.1*   < > 23.3* 24.3*  --  23.5* 23.0* 23.2*  MCV 93.4 93.6  --   --  88.0  --  86.7 88.8 89.6  PLT 308 258  --   --  164  --  170 211 202   < > = values in this interval not displayed.   Cardiac Enzymes: No results for input(s): CKTOTAL, CKMB, CKMBINDEX, TROPONINI in the last 168 hours. BNP: Invalid input(s): POCBNP CBG: No results for input(s): GLUCAP in the last 168 hours. D-Dimer No results for input(s): DDIMER in the last 72 hours. Hgb A1c No results for input(s): HGBA1C in the last 72 hours. Lipid Profile No results for input(s): CHOL, HDL, LDLCALC, TRIG, CHOLHDL, LDLDIRECT in the last 72 hours. Thyroid function studies No results for input(s): TSH, T4TOTAL, T3FREE, THYROIDAB in the last 72 hours.  Invalid input(s): FREET3 Anemia work up No results for input(s): VITAMINB12, FOLATE, FERRITIN, TIBC, IRON, RETICCTPCT in the last 72 hours. Microbiology Recent Results (from the past 240 hour(s))  Blood culture (routine single)     Status: None (Preliminary result)   Collection Time: 08/15/20  7:12 AM   Specimen: BLOOD  Result Value Ref Range Status   Specimen Description BLOOD SITE NOT SPECIFIED  Final   Special Requests   Final    BOTTLES DRAWN AEROBIC AND ANAEROBIC Blood Culture results may not be optimal due to an inadequate volume of blood received in culture bottles   Culture   Final    NO GROWTH 3 DAYS Performed at [facility], [place] [address], [place], [place] [address]    Report Status PENDING  Incomplete  Resp Panel by RT-PCR (Flu A&B, Covid) Nasopharyngeal Swab      Status: None   Collection Time: 08/15/20  7:18 AM   Specimen: Nasopharyngeal Swab; Nasopharyngeal(NP) swabs in vial transport medium  Result Value Ref Range Status   SARS Coronavirus 2 by RT PCR NEGATIVE NEGATIVE Final    Comment: (  NOTE) SARS-CoV-2 target nucleic acids are NOT DETECTED.  The SARS-CoV-2 RNA is generally detectable in upper respiratory specimens during the acute phase of infection. The lowest concentration of SARS-CoV-2 viral copies this assay can detect is 138 copies/mL. A negative result does not preclude SARS-Cov-2 infection and should not be used as the sole basis for treatment or other patient management decisions. A negative result may occur with  improper specimen collection/handling, submission of specimen other than nasopharyngeal swab, presence of viral mutation(s) within the areas targeted by this assay, and inadequate number of viral copies(<138 copies/mL). A negative result must be combined with clinical observations, patient history, and epidemiological information. The expected result is Negative.  Fact Sheet for Patients:  EntrepreneurPulse.com.au  Fact Sheet for Healthcare Providers:  IncredibleEmployment.be  This test is no t yet approved or cleared by the [place] FDA and  has been authorized for detection and/or diagnosis of SARS-CoV-2 by FDA under an Emergency Use Authorization (EUA). This EUA will remain  in effect (meaning this test can be used) for the duration of the COVID-19 declaration under Section 564(b)(1) of the Act, 21 U.S.C.section 360bbb-3(b)(1), unless the authorization is terminated  or revoked sooner.       Influenza A by PCR NEGATIVE NEGATIVE Final   Influenza B by PCR NEGATIVE NEGATIVE Final    Comment: (NOTE) The Xpert Xpress SARS-CoV-2/FLU/RSV plus assay is intended as an aid in the diagnosis of influenza from Nasopharyngeal swab specimens and should not be used as a sole basis  for treatment. Nasal washings and aspirates are unacceptable for Xpert Xpress SARS-CoV-2/FLU/RSV testing.  Fact Sheet for Patients: EntrepreneurPulse.com.au  Fact Sheet for Healthcare Providers: IncredibleEmployment.be  This test is not yet approved or cleared by the [place] FDA and has been authorized for detection and/or diagnosis of SARS-CoV-2 by FDA under an Emergency Use Authorization (EUA). This EUA will remain in effect (meaning this test can be used) for the duration of the COVID-19 declaration under Section 564(b)(1) of the Act, 21 U.S.C. section 360bbb-3(b)(1), unless the authorization is terminated or revoked.  Performed at [facility], [place] [address] [place], [place] [address]   Culture, blood (single)     Status: None (Preliminary result)   Collection Time: 08/15/20 10:50 AM   Specimen: BLOOD  Result Value Ref Range Status   Specimen Description BLOOD SITE NOT SPECIFIED  Final   Special Requests   Final    BOTTLES DRAWN AEROBIC AND ANAEROBIC Blood Culture adequate volume   Culture   Final    NO GROWTH 3 DAYS Performed at [facility], [address], [place], [place] [address]    Report Status PENDING  Incomplete     Discharge Instructions:   Discharge Instructions    Discharge instructions   Complete by: As directed    Home health protonix 40mg  BID  until he can have a follow up EGD as outpatient to confirm mucosal healing in a few months -resume Wednesday and keep INR at lowest end of acceptable range - avoid all NSAIDs (he has taken these recently) - repeat EGD in a few months to confirm mucosal healing of ulcer   Increase activity slowly   Complete by: As directed      Allergies as of 08/19/2020      Reactions   Nsaids Other (See Comments)   Asked by surgeon to add this medication class as intolerance due to patients renal insufficiency.   Lactase Diarrhea   Lactose Intolerance (  gi) Diarrhea    Phentermine Other (See Comments)   Unknown reaction   Amlodipine Other (See Comments)   Edema      Medication List    STOP taking these medications   allopurinol 100 MG tablet Commonly known as: ZYLOPRIM   BiDil 20-37.5 MG tablet Generic drug: isosorbide-hydrALAZINE   metoprolol succinate 25 MG 24 hr tablet Commonly known as: TOPROL-XL     TAKE these medications   acetaminophen 325 MG tablet Commonly known as: TYLENOL Take 650 mg by mouth every 4 (four) hours as needed for mild pain or moderate pain.   atorvastatin 20 MG tablet Commonly known as: LIPITOR Take 1 tablet (20 mg total) by mouth every evening.   cinacalcet 60 MG tablet Commonly known as: SENSIPAR Take 60 mg by mouth daily.   diphenoxylate-atropine 2.5-0.025 MG tablet Commonly known as: Lomotil Take 1 tablet by mouth 4 (four) times daily.   Fosrenol 1000 MG Pack Generic drug: Lanthanum Carbonate Take 1,000 mg by mouth 3 (three) times daily with meals.   gabapentin 100 MG capsule Commonly known as: NEURONTIN TAKE 1 CAPSULE BY MOUTH THREE TIMES DAILY   lipase/protease/amylase 36000 UNITS Cpep capsule Commonly known as: Creon Take 2 capsules (72,000 Units total) by mouth 3 (three) times daily with meals. May also take 1 capsule (36,000 Units total) as needed (with snacks).   ondansetron 4 MG tablet Commonly known as: ZOFRAN TAKE ONE TABLET BY MOUTH EVERY 8 HOURS AS NEEDED   oxyCODONE 5 MG immediate release tablet Commonly known as: Oxy IR/ROXICODONE Take 1 tablet (5 mg total) by mouth every 6 (six) hours as needed for moderate pain.   pantoprazole 40 MG tablet Commonly known as: PROTONIX Take 1 tablet (40 mg total) by mouth 2 (two) times daily.   polycarbophil 625 MG tablet Commonly known as: FIBERCON Take 625 mg by mouth as needed for diarrhea or loose stools.   warfarin 5 MG tablet Commonly known as: COUMADIN Take as directed. If you are unsure how to take this medication, talk to your  nurse or doctor. Original instructions: Take 5 mg on Sun, Tue, Wed, Thurs, and Sat. Take 2.5 mg on Mon and Fri. Start taking on: August 22, 2020 What changed:   additional instructions  These instructions start on August 22, 2020. If you are unsure what to do until then, ask your doctor or other care provider.       Follow-up Information    [name], MD Follow up in 1 week(s).   Specialty: Internal Medicine Contact information: [place] [address] [phone]                Time coordinating discharge: 35 min  Signed:  Geradine Girt DO  [facility] 08/19/2020, 10:19 AM

## 2020-08-19 NOTE — Progress Notes (Signed)
[facility] Progress Note  Subjective: seen in room, no c/o  Vitals:   08/18/20 2157 08/19/20 0454 08/19/20 0545 08/19/20 0811  BP: 126/63  125/65 122/64  Pulse: 86  77 79  Resp: 15  11 16   Temp: 97.8 F (36.6 C)  97.9 F (36.6 C) 98.9 F (37.2 C)  TempSrc: Oral  Oral Oral  SpO2: 99%  98% 100%  Weight:  57.6 kg    Height:        Exam:   alert, nad   no jvd  Chest cta bilat  Cor reg no RG  Abd soft ntnd no ascites   Ext no LE edema   Alert, NF, ox3   LUA AVF+bruit    OP HD: Home meds:  - bidil tid / metoprolol xl 25 qd  - warfarin qd  - oxy IR qid prn/ neurontin 100 tid  - lomotil qid/ creon ac tid/ prn zofran/ fibercon  - zyloprim 100 / lipitor 20/ sensipar 60 qd  - prn's/ vitamins/ supplements    OP HD: [place] TTS  4h  3K/2Ca bath  62kg (coming off under) Hep none  LUA AVF  - hect 7 ug  - venofer 50 q wk  - mircera 50 q4 wks, last 1/25   Assessment/ Plan: 1. GI bleed - pt on nsaids at home. EGD 2/09 showed large pyloric channel ulcer w/ active bleeding, treated w/ clip+ injection. Getting PPI. Probable DC home today.  2. ESRD - usual HD TTS. Had HD here Thursday and Saturday. Next HD on 2/15.  3. H/o CVA 4. Atrial fib - on coumadin, high INR was reversed. Was on po abx which may have ^'d the INR. Per GI can resume coumadin this week (3-5 days).  5. Nondisplaced RLE fibula fracture - R leg swelling after minor trauma. Ortho recommended treat w/ cam boot and WBAT. Per pmd.  6. Hypotension - resolved 7. Volume - LE edema resolved. New dry wt around 58kg.  8. HTN - BB and bidil on hold, BP's good 9. COPD 10. H/o prior rectal cancer - in 2014 treated w/ chemoRx, radiation, laparoscopic low anterior resection with diverting loop ileostomy and subsequent ileostomy takedown.  11. Anemia CKD + GIB - next esa due , sp darbe 60 ug here on 2/12 12. MBD ckd - cont meds      Rob Atonya Templer 08/19/2020, 12:50 PM   Recent Labs  Lab 08/16/20 0620  08/16/20 1341 08/17/20 0225 08/18/20 0423 08/19/20 0322  K 4.8  --  3.8 4.0  --   BUN 104*  --  52* 60*  --   CREATININE 7.37*  --  5.63* 7.11*  --   CALCIUM 9.5  --  9.1 8.4*  --   PHOS 7.2*  --   --   --   --   HGB 8.0*   < > 7.9* 7.5* 7.7*   < > = values in this interval not displayed.   Inpatient medications: . sodium chloride   Intravenous Once  . Chlorhexidine Gluconate Cloth  6 each Topical Q0600  . cinacalcet  60 mg Oral Q supper  . darbepoetin (ARANESP) injection - DIALYSIS  60 mcg Intravenous Q Sat-HD  . doxercalciferol  7 mcg Intravenous Q T,Th,Sa-HD  . pantoprazole  40 mg Oral BID  . sodium chloride flush  3 mL Intravenous Q12H    acetaminophen **OR** acetaminophen, loperamide, ondansetron **OR** ondansetron (ZOFRAN) IV

## 2020-08-20 ENCOUNTER — Ambulatory Visit: Payer: Medicare Other

## 2020-08-20 ENCOUNTER — Encounter: Payer: Self-pay | Admitting: Gastroenterology

## 2020-08-20 ENCOUNTER — Telehealth: Payer: Self-pay

## 2020-08-20 ENCOUNTER — Telehealth: Payer: Self-pay | Admitting: Nephrology

## 2020-08-20 ENCOUNTER — Other Ambulatory Visit (INDEPENDENT_AMBULATORY_CARE_PROVIDER_SITE_OTHER): Payer: Medicare Other

## 2020-08-20 ENCOUNTER — Telehealth: Payer: Self-pay | Admitting: Gastroenterology

## 2020-08-20 ENCOUNTER — Ambulatory Visit: Payer: Medicare Other | Admitting: Gastroenterology

## 2020-08-20 ENCOUNTER — Other Ambulatory Visit: Payer: Medicare Other

## 2020-08-20 VITALS — BP 118/50 | HR 91

## 2020-08-20 DIAGNOSIS — D62 Acute posthemorrhagic anemia: Secondary | ICD-10-CM | POA: Diagnosis not present

## 2020-08-20 DIAGNOSIS — K922 Gastrointestinal hemorrhage, unspecified: Secondary | ICD-10-CM

## 2020-08-20 DIAGNOSIS — Z7902 Long term (current) use of antithrombotics/antiplatelets: Secondary | ICD-10-CM | POA: Diagnosis not present

## 2020-08-20 DIAGNOSIS — R634 Abnormal weight loss: Secondary | ICD-10-CM | POA: Diagnosis not present

## 2020-08-20 DIAGNOSIS — R110 Nausea: Secondary | ICD-10-CM | POA: Diagnosis not present

## 2020-08-20 DIAGNOSIS — K250 Acute gastric ulcer with hemorrhage: Secondary | ICD-10-CM | POA: Diagnosis not present

## 2020-08-20 DIAGNOSIS — K529 Noninfective gastroenteritis and colitis, unspecified: Secondary | ICD-10-CM | POA: Diagnosis not present

## 2020-08-20 DIAGNOSIS — R1013 Epigastric pain: Secondary | ICD-10-CM | POA: Diagnosis not present

## 2020-08-20 LAB — CULTURE, BLOOD (SINGLE)
Culture: NO GROWTH
Culture: NO GROWTH
Special Requests: ADEQUATE

## 2020-08-20 LAB — CBC WITH DIFFERENTIAL/PLATELET
Basophils Absolute: 0.1 10*3/uL (ref 0.0–0.1)
Basophils Relative: 0.6 % (ref 0.0–3.0)
Eosinophils Absolute: 0.1 10*3/uL (ref 0.0–0.7)
Eosinophils Relative: 0.4 % (ref 0.0–5.0)
HCT: 26.3 % — ABNORMAL LOW (ref 39.0–52.0)
Hemoglobin: 8.5 g/dL — ABNORMAL LOW (ref 13.0–17.0)
Lymphocytes Relative: 5.4 % — ABNORMAL LOW (ref 12.0–46.0)
Lymphs Abs: 0.7 10*3/uL (ref 0.7–4.0)
MCHC: 32.3 g/dL (ref 30.0–36.0)
MCV: 90.2 fl (ref 78.0–100.0)
Monocytes Absolute: 2.2 10*3/uL — ABNORMAL HIGH (ref 0.1–1.0)
Monocytes Relative: 15.8 % — ABNORMAL HIGH (ref 3.0–12.0)
Neutro Abs: 10.7 10*3/uL — ABNORMAL HIGH (ref 1.4–7.7)
Neutrophils Relative %: 77.8 % — ABNORMAL HIGH (ref 43.0–77.0)
Platelets: 280 10*3/uL (ref 150.0–400.0)
RBC: 2.91 Mil/uL — ABNORMAL LOW (ref 4.22–5.81)
RDW: 17.6 % — ABNORMAL HIGH (ref 11.5–15.5)
WBC: 13.8 10*3/uL — ABNORMAL HIGH (ref 4.0–10.5)

## 2020-08-20 LAB — PROTIME-INR
INR: 2.1 ratio — ABNORMAL HIGH (ref 0.8–1.0)
Prothrombin Time: 23.3 s — ABNORMAL HIGH (ref 9.6–13.1)

## 2020-08-20 LAB — H. PYLORI ANTIBODY, IGG: H Pylori IgG: 0.16 Index Value (ref 0.00–0.79)

## 2020-08-20 NOTE — Progress Notes (Signed)
[facility] Progress Note  Chief Complaint: Follow-up gastric ulcer, nausea and abdominal pain  Subjective  History: Jason [name] was seen as a sick visit today after his wife called this morning. I heard from [place] early morning while on call last week, she was telling me Jason [name] had had severe diarrhea to the point of profound weakness that whole previous afternoon and overnight.  I recommended they go to the emergency department, and it turned out he was in fact having melena, and was unstable from an upper GI bleed.  He was hypotensive and acidemic and also supratherapeutic on his Coumadin with an INR over 8.  His hemoglobin was 7.0 down from 11.7 just 5 days prior.  He had also been taking NSAIDs regularly.  He received PRBCs, vitamin K and FFP and underwent upper endoscopy by Dr. Havery Moros on 08/15/2020.  I reviewed that report and also discussed the findings and plan with Dr. Havery Moros last week.Jason [name] had an actively bleeding pyloric channel ulcer that required endoscopic control with clip placement and epinephrine injection.  He had some ongoing melena that finally cleared and his hemoglobin stabilized.  He was discharged yesterday.  His wife called today saying he was having recurrence of black stool, nausea and upper abdominal pain.  He had been unable to eat much other than liquids due to the symptoms.  Jason [name] has not felt well since prior to discharge yesterday. He has dull upper abdominal discomfort and nausea limiting his food intake. He just does not want to eat much and he feels weak as a result. He also still has some right lower leg pain since it was discovered that he suffered a nondisplaced tibia fracture from a recent injury. He was apparently prescribed some opioids by primary care but his wife says he has not really needed that. She is concerned that he still has loose stool, which is typical for him. She was concerned that it looked black. She and Jason [name] also both expressed  frustration that they did not really understand much of what it happened during the hospital stay. I had a long visit with them today summarizing the findings and plan. Per usual with these encounters, both Jason [name] and Jason [name] have some uncertainty about his medicines. ROS: Cardiovascular:  no chest pain Respiratory: no dyspnea Generalized weakness and fatigue. The patient's Past Medical, Family and Social History were reviewed and are on file in the EMR.  Objective:  Med list reviewed  Current Outpatient Medications:  .  acetaminophen (TYLENOL) 325 MG tablet, Take 650 mg by mouth every 4 (four) hours as needed for mild pain or moderate pain. , Disp: , Rfl:  .  atorvastatin (LIPITOR) 20 MG tablet, Take 1 tablet (20 mg total) by mouth every evening., Disp: 90 tablet, Rfl: 1 .  cinacalcet (SENSIPAR) 60 MG tablet, Take 60 mg by mouth daily., Disp: , Rfl:  .  diphenoxylate-atropine (LOMOTIL) 2.5-0.025 MG tablet, Take 1 tablet by mouth 4 (four) times daily., Disp: 120 tablet, Rfl: 4 .  FOSRENOL 1000 MG PACK, Take 1,000 mg by mouth 3 (three) times daily with meals. , Disp: , Rfl:  .  gabapentin (NEURONTIN) 100 MG capsule, TAKE 1 CAPSULE BY MOUTH THREE TIMES DAILY (Patient taking differently: Take 100 mg by mouth 3 (three) times daily.), Disp: 90 capsule, Rfl: 5 .  lipase/protease/amylase (CREON) 36000 UNITS CPEP capsule, Take 2 capsules (72,000 Units total) by mouth 3 (three) times daily with meals. May also take 1 capsule (36,000 Units total)  as needed (with snacks)., Disp: 240 capsule, Rfl: 11 .  ondansetron (ZOFRAN) 4 MG tablet, TAKE ONE TABLET BY MOUTH EVERY 8 HOURS AS NEEDED, Disp: 45 tablet, Rfl: 1 .  oxyCODONE (OXY IR/ROXICODONE) 5 MG immediate release tablet, Take 1 tablet (5 mg total) by mouth every 6 (six) hours as needed for moderate pain., Disp: 30 tablet, Rfl: 0 .  pantoprazole (PROTONIX) 40 MG tablet, Take 1 tablet (40 mg total) by mouth 2 (two) times daily., Disp: 60 tablet, Rfl: 1 .   [START ON 08/22/2020] warfarin (COUMADIN) 5 MG tablet, Take 5 mg on Sun, Tue, Wed, Thurs, and Sat. Take 2.5 mg on Mon and Fri., Disp: , Rfl:    Vital signs in last 24 hrs: Vitals:   08/20/20 1538  BP: (!) 118/50  Pulse: 91   Wt Readings from Last 3 Encounters:  08/19/20 126 lb 15.8 oz (57.6 kg)  08/13/20 140 lb (63.5 kg)  08/10/20 136 lb 11 oz (62 kg)  (Jason [name] is down 13 pounds since his prehospital weight) Physical Exam  Jason [name] is in a wheelchair because he was fatigued today. He was able to get on the exam table slowly but without assistance. Generalized muscle wasting  HEENT: sclera anicteric, oral mucosa moist without lesions  Neck: supple, no thyromegaly, JVD or lymphadenopathy  Cardiac: RRR without murmurs, S1S2 heard, no peripheral edema  Pulm: clear to auscultation bilaterally, normal RR and effort noted  Abdomen: soft, no tenderness, with active bowel sounds. No guarding or palpable hepatosplenomegaly.  Skin; warm and dry, no jaundice or rash Rectal: Anal sphincter which limits DRE. Loose dark brown stool, not melena Labs: Stat labs done today prior to this visit and some prior values:  CBC Latest Ref Rng & Units 08/20/2020 08/19/2020 08/18/2020  WBC 4.0 - 10.5 K/uL 13.8(H) 13.3(H) 12.9(H)  Hemoglobin 13.0 - 17.0 g/dL 8.5 Repeated and verified X2.(L) 7.7(L) 7.5(L)  Hematocrit 39.0 - 52.0 % 26.3 Repeated and verified X2.(L) 23.2(L) 23.0(L)  Platelets 150.0 - 400.0 K/uL 280.0 202 211   Lab Results  Component Value Date   INR 2.1 (H) 08/20/2020   INR 1.5 (H) 08/17/2020   INR 1.4 (H) 08/16/2020    Extensive hospital notes and records with additional data also reviewed. ___________________________________________ Radiologic studies:   ____________________________________________ Other:   _____________________________________________ Assessment & Plan  Assessment: Encounter Diagnoses  Name Primary?  . Acute gastric ulcer with hemorrhage Yes  . Acute blood  loss anemia   . Chronic diarrhea   . Weight loss   . Long term (current) use of antithrombotics/antiplatelets   . Nausea in adult   . Epigastric pain    Jason [name] is a medically complex patient with end-stage renal disease on dialysis and chronic diarrhea of unknown cause after extensive work-up in the last 2 years. He has now had a severe GI bleed from NSAID induced gastric ulcer that was controlled endoscopically and with reversal of supratherapeutic INR. Hospital records indicate that he had received some antibiotics recently, perhaps from a skin injury to his lower leg that resulted in the fracture. This may have triggered the INR elevation.  His persistent abdominal pain and nausea are related to the gastric ulcer that will take weeks to heal. He is on twice daily PPI and was prescribed ondansetron at the time of discharge. Stability of hemoglobin, and lack of melena on exam today indicate the bleeding has not recurred. INR at goal today.  Jason [name] was confused because there were some apparent changes in  meds, so we have looked over his hospital discharge medicine list. It also includes twice daily Protonix and Lomotil. My staff will go over this with her to clarify. Avoid aspirin and NSAIDs indefinitely. Tylenol is okay  On that note, it seems to me that this lovely couple would benefit from some outpatient care coordination with perhaps periodic nurse visit for other chronic care management and medication review.  Jason [name]'s anemia will take some time to improve, but I hope he can also get some IV iron at dialysis this week to help with that. We have printed a copy of this office note so he can bring it to his dialysis session tomorrow to help coordinate that. He also needs his hemoglobin checked weekly at dialysis.  I will copy this note to primary care as well regarding the issue of outpatient chronic care management.  He has follow-up scheduled with me for next month.  45 minutes were spent on  this encounter (including chart review, history/exam, counseling/coordination of care, and documentation) > 50% of that time was spent on counseling and coordination of care.  Topics discussed included: Summary of hospitalization and treatment plan.Marland Kitchen  Jason [name]

## 2020-08-20 NOTE — Telephone Encounter (Signed)
Pt states that he is still experiencing abdominal pain and black stools. He would like some advise.

## 2020-08-20 NOTE — Telephone Encounter (Signed)
He was hospitalized last week for a bleeding ulcer with endoscopy by Dr. Havery Moros on February 9.  I do not know if the black stool is old blood still passing through the GI tract or if he could be rebleeding from the ulcer, but I am concerned it may be the latter.  If she is able to do so, please have [name] bring him to the office this afternoon for a stat CBC and PT/INR, and I will work him in to be seen in clinic.  - HD

## 2020-08-20 NOTE — Telephone Encounter (Signed)
Transition Care Management Unsuccessful Follow-up Telephone Call  Date of discharge and from where:  08/19/2020 from [facility]  Attempts:  1st Attempt  Reason for unsuccessful TCM follow-up call:  Unable to leave message  Nurse Notes: Attempted to contact patient to discuss transition of care from inpatient admission.  Patient did not answer the phone.  Unable to leave voicemail as it was not set up.  Will attempt to call again or follow up.

## 2020-08-20 NOTE — Telephone Encounter (Signed)
Spoke with patient and his wife, he states that he is not tolerating solid foods but can tolerate liquids, reports a dull constant pain in his stomach, denies taking NSAID's, reports that he has been taking Tylenol 500 mg every 4-5 hours, reports black diarrhea twice this morning, wife is unsure of how many episodes he had yesterday but she said it was "quite a bit." Wife reports that patient is taking Protonix 40 mg BID as prescribed. Advised wife that since he is having frequent episodes of diarrhea he needs to stay hydrated with Gatorade, and Pedialyte. Advised that since he is not tolerating solid foods he can supplement with Boost or Ensure. Please advise on further recommendations, thanks.

## 2020-08-20 NOTE — Telephone Encounter (Signed)
Transition of Care Contact from [place]  Date of Discharge: 08/19/20 Date of Contact: 08/20/20 Method of contact: phone - attempted  Attempted to contact patient to discuss transition of care from inpatient admission.  Patient did not answer the phone.  Unable to leave message due to voicemail not being set up.    Jen Mow, PA-C [facility] Pager: [phone]

## 2020-08-20 NOTE — Telephone Encounter (Signed)
Spoke with patient's wife in regards to Dr. Loletha Carrow' recommendations. She will bring patient in about 30 minutes early for STAT labs and will proceed to the 3rd floor for appointment at 3:40 PM today.Jason [name], CMA is aware that patient was added. Wife verbalized understanding of information and had no concerns at the end of the call.   Lab orders in epic.

## 2020-08-21 ENCOUNTER — Telehealth: Payer: Self-pay

## 2020-08-21 ENCOUNTER — Telehealth: Payer: Self-pay | Admitting: Gastroenterology

## 2020-08-21 ENCOUNTER — Other Ambulatory Visit: Payer: Self-pay | Admitting: Internal Medicine

## 2020-08-21 DIAGNOSIS — R634 Abnormal weight loss: Secondary | ICD-10-CM

## 2020-08-21 DIAGNOSIS — R52 Pain, unspecified: Secondary | ICD-10-CM | POA: Diagnosis not present

## 2020-08-21 DIAGNOSIS — N186 End stage renal disease: Secondary | ICD-10-CM

## 2020-08-21 DIAGNOSIS — Z283 Underimmunization status: Secondary | ICD-10-CM | POA: Diagnosis not present

## 2020-08-21 DIAGNOSIS — D631 Anemia in chronic kidney disease: Secondary | ICD-10-CM | POA: Diagnosis not present

## 2020-08-21 DIAGNOSIS — Z23 Encounter for immunization: Secondary | ICD-10-CM | POA: Diagnosis not present

## 2020-08-21 DIAGNOSIS — Z992 Dependence on renal dialysis: Secondary | ICD-10-CM

## 2020-08-21 DIAGNOSIS — R1013 Epigastric pain: Secondary | ICD-10-CM

## 2020-08-21 DIAGNOSIS — S82464A Nondisplaced segmental fracture of shaft of right fibula, initial encounter for closed fracture: Secondary | ICD-10-CM

## 2020-08-21 DIAGNOSIS — N2581 Secondary hyperparathyroidism of renal origin: Secondary | ICD-10-CM | POA: Diagnosis not present

## 2020-08-21 DIAGNOSIS — K254 Chronic or unspecified gastric ulcer with hemorrhage: Secondary | ICD-10-CM

## 2020-08-21 DIAGNOSIS — R197 Diarrhea, unspecified: Secondary | ICD-10-CM | POA: Diagnosis not present

## 2020-08-21 DIAGNOSIS — D509 Iron deficiency anemia, unspecified: Secondary | ICD-10-CM | POA: Diagnosis not present

## 2020-08-21 DIAGNOSIS — K529 Noninfective gastroenteritis and colitis, unspecified: Secondary | ICD-10-CM

## 2020-08-21 LAB — URINE CULTURE: Culture: 4000 — AB

## 2020-08-21 NOTE — Telephone Encounter (Signed)
Spoke with patient's wife in regards to Dr. Loletha Carrow' recommendations as outlined below. She is aware that if patient's calorie intake decreases or if he is unable to tolerate anything PO or if the Lomotil does not help control the loose stools then she will need to take patient to the hospital for admission. She states that stools are more solid than they were. She verbalized understanding of all of Dr. Loletha Carrow' recommendations and had no further concerns at the end of the call.

## 2020-08-21 NOTE — Telephone Encounter (Signed)
Transition Care Management Unsuccessful Follow-up Telephone Call  Date of discharge and from where:  08/19/2020 from [facility]  Attempts:  2nd Attempt  Reason for unsuccessful TCM follow-up call:  Voice mail full   Nurse Note: Attempted to contact patient to discuss transition of care from inpatient admission.  Patient did not answer the phone.  Unable to leave voicemail as it was full and not able to leave a message.  Will attempt to call again or follow up.

## 2020-08-21 NOTE — Telephone Encounter (Signed)
Inbound call from patient's wife requesting a call back from the nurse please.  States patient is still experiencing pain.

## 2020-08-21 NOTE — Telephone Encounter (Signed)
I totally understand that Jason [name] is sick and she is very concerned about Jason [name]. As of yesterday afternoon's visit, he was not having ongoing ulcer bleeding.  This ulcer may take weeks to heal and we cannot hasten that recovery in any other way.  If the ondansetron does not control his nausea well enough to allow Jason [name] to increase his calorie intake and/or if 2 Lomotil tablets 4 times a day do not control his diarrhea, then Jason [name] will need to bring Jason [name] back to the hospital for admission to give Jason [name] fluid and nutritional support and perhaps intravenous medicines to help control symptoms and keep Jason [name] from losing more weight.

## 2020-08-21 NOTE — Telephone Encounter (Signed)
Spoke with patient's wife, she states that patient is still having "really bad diarrhea", she states that he had dialysis today, 3 episodes of black diarrhea today so far, still no appetite, she states that patient has been drinking 2 Ensure a day but it doesn't seem like its enough for him. Wife states that patient is taking Zofran as needed for nausea, Protonix 40 mg BID, she also states that he is taking the Lomotil as prescribed. Re-discussed that it will take some time for the ulcer to heal. Wife is seeking further advice. Please advise, thank you.

## 2020-08-22 ENCOUNTER — Telehealth: Payer: Self-pay

## 2020-08-22 ENCOUNTER — Ambulatory Visit (INDEPENDENT_AMBULATORY_CARE_PROVIDER_SITE_OTHER): Payer: Medicare Other | Admitting: *Deleted

## 2020-08-22 ENCOUNTER — Encounter: Payer: Self-pay | Admitting: *Deleted

## 2020-08-22 DIAGNOSIS — Z992 Dependence on renal dialysis: Secondary | ICD-10-CM

## 2020-08-22 DIAGNOSIS — N186 End stage renal disease: Secondary | ICD-10-CM | POA: Diagnosis not present

## 2020-08-22 DIAGNOSIS — I10 Essential (primary) hypertension: Secondary | ICD-10-CM

## 2020-08-22 NOTE — Telephone Encounter (Signed)
Transition Care Management Follow-up Telephone Call  Date of discharge and from where: 08/19/2020 from [facility]  How have you been since you were released from the hospital? Per patient's wife: still having some diarrhea and feeling about the same; no changes.  Any questions or concerns? No  Items Reviewed:  Did the pt receive and understand the discharge instructions provided? Yes   Medications obtained and verified? Yes  (Warfarin 5 mg - start taking on 08/22/2020)  Other? No   Any new allergies since your discharge? No   Dietary orders reviewed? No (per wife, he is eating soups)  Do you have support at home? Yes  (wife is very supportive)  Home Care and Equipment/Supplies: Were home health services ordered? yes If so, what is the name of the agency? [place]   Has the agency set up a time to come to the patient's home? yes Were any new equipment or medical supplies ordered?  No What is the name of the medical supply agency? n/a Were you able to get the supplies/equipment? not applicable Do you have any questions related to the use of the equipment or supplies? No  Functional Questionnaire: (I = Independent and D = Dependent) ADLs: Dependent  Bathing/Dressing- D  Meal Prep- D  Eating- D  Maintaining continence- D  Transferring/Ambulation- D  Managing Meds- D  Follow up appointments reviewed:   PCP Hospital f/u appt confirmed? Yes  Scheduled to see Jodi Mourning, PA on 08/27/2020 @ 1:20 pm.  [facility] f/u appt confirmed? No    Are transportation arrangements needed? No   If their condition worsens, is the pt aware to call PCP or go to the Emergency Dept.? Yes  Was the patient provided with contact information for the PCP's office or ED? Yes  Was to pt encouraged to call back with questions or concerns? Yes

## 2020-08-22 NOTE — Patient Instructions (Signed)
Visit Information  Dalvin and Phylis, it was nice talking with you today; I will contact you again as I receive follow up information from Dr. Loletha Carrow Pharmacist Mendel Ryder about whether Federico should be taking the POLYCARBOPHIL after his recent hospital visit and ongoing diarrhea, as well as when I hear back about the home health services that were ordered while he was in the hospital.  I have included information around foods that are recommended for bland diet.  In the meantime, please do not hesitate to contact me if you have any questions or concerns.  I look forward to speaking with you again for an update next week on August 29, 2020; I will call between 9:00-9:30 am.  Oneta Rack, RN, BSN, [place] [phone]   PATIENT GOALS:  Goals Addressed            This Visit's Progress   . Make and Keep All Appointments   On track    Timeframe:  Long-Range Goal Priority:  Medium Start Date:     08/22/20                        Expected End Date:   10/17/20                    Follow Up Date: 08/29/20  . Keep a calendar with appointment dates  . Please continue to attend all regularly scheduled Hemodialysis sessions . Please contact your care providers promptly for any new concerns, issues, or problems that arise . I have asked someone from the [facility] PCP office to contact you to schedule an office visit appointment with Dr. Ronnald Ramp after your recent hospitalization- please listen out for this call, or call the office at [phone] to schedule this appointment yourself   Why is this important?    Part of staying healthy is seeing the doctor for follow-up care.   If you forget your appointments, there are some things you can do to stay on track.        .           .           . Track and Manage My Blood Pressure-Hypertension   On track    Timeframe:  Long-Range Goal Priority:  Medium Start Date:      08/22/20                        Expected End Date:    10/17/20                   Follow Up Date: 08/29/20  .  Check blood pressure daily . Write blood pressure results in a log or diary- we will review these when we talk by phone next week  . Call provider office for new concerns, questions, or any concerns witjh BP at home . Continue following a low sodium diet/DASH diet   Why is this important?    You won't feel high blood pressure, but it can still hurt your blood vessels.   High blood pressure can cause heart or kidney problems. It can also cause a stroke.   Making lifestyle changes like losing a little weight or eating less salt will help.   Checking your blood pressure at home and at different times of the day can help to control blood pressure.  If the doctor prescribes medicine remember to take it the way the doctor ordered.   Call the office if you cannot afford the medicine or if there are questions about it.            Gastrointestinal Bleeding Gastrointestinal (GI) bleeding is bleeding somewhere along the path that food travels through the body (digestive tract). This path is anywhere between the mouth and the opening of the butt (anus). You may have blood in your poop (stool) or have black poop. If you throw up (vomit), there may be blood in it. This condition can be mild, serious, or even life-threatening. If you have a lot of bleeding, you may need to stay in the hospital. What are the causes? This condition may be caused by:  Irritation and swelling of the esophagus (esophagitis). The esophagus is part of the body that moves food from your mouth to your stomach.  Swollen veins in the butt (hemorrhoids).  Areas of painful tearing in the opening of the butt (anal fissures). These are often caused by passing hard poop.  Pouches that form on the colon over time (diverticulosis).  Irritation and swelling (diverticulitis) in areas where pouches have formed on the  colon.  Growths (polyps) or cancer. Colon cancer often starts out as growths that are not cancer.  Irritation of the stomach lining (gastritis).  Sores (ulcers) in the stomach. What increases the risk? You are more likely to develop this condition if you:  Have a certain type of infection in your stomach (Helicobacter pylori infection).  Take certain medicines.  Smoke.  Drink alcohol. What are the signs or symptoms? Common symptoms of this condition include:  Throwing up (vomiting) material that has bright red blood in it. It may look like coffee grounds.  Changes in your poop. The poop may: ? Have red blood in it. ? Be black, look like tar, and smell stronger than normal. ? Be red.  Pain or cramping in the belly (abdomen). How is this treated? Treatment for this condition depends on the cause of the bleeding. For example:  Sometimes, the bleeding can be stopped during a procedure that is done to find the problem (endoscopy or colonoscopy).  Medicines can be used to: ? Help control irritation, swelling, or infection. ? Reduce acid in your stomach.  Certain problems can be treated with: ? Creams. ? Medicines that are put in the butt (suppositories). ? Warm baths.  Surgery is sometimes needed.  If you lose a lot of blood, you may need a blood transfusion. If bleeding is mild, you may be allowed to go home. If there is a lot of bleeding, you will need to stay in the hospital. Follow these instructions at home:  Take over-the-counter and prescription medicines only as told by your doctor.  Eat foods that have a lot of fiber in them. These foods include beans, whole grains, and fresh fruits and vegetables. You can also try eating 1-3 prunes each day.  Drink enough fluid to keep your pee (urine) pale yellow.  Keep all follow-up visits as told by your doctor. This is important.   Contact a doctor if:  Your symptoms do not get better. Get help right away if:  Your  bleeding does not stop.  You feel dizzy or you pass out (faint).  You feel weak.  You have very bad cramps in your back or belly.  You pass large clumps of blood (clots) in your poop.  Your symptoms are getting  worse.  You have chest pain or fast heartbeats. Summary  GI bleeding is bleeding somewhere along the path that food travels through the body (digestive tract).  This bleeding can be caused by many things. Treatment depends on the cause of the bleeding.  Take medicines only as told by your doctor.  Keep all follow-up visits as told by your doctor. This is important. This information is not intended to replace advice given to you by your health care provider. Make sure you discuss any questions you have with your health care provider. Document Revised: 02/03/2018 Document Reviewed: 02/03/2018 Elsevier Patient Education  2021 [place] Diet A bland diet consists of foods that are often soft and do not have a lot of fat, fiber, or extra seasonings. Foods without fat, fiber, or seasoning are easier for the body to digest. They are also less likely to irritate your mouth, throat, stomach, and other parts of your digestive system. A bland diet is sometimes called a BRAT diet. What is my plan? Your health care provider or food and nutrition specialist (dietitian) may recommend specific changes to your diet to prevent symptoms or to treat your symptoms. These changes may include:  Eating small meals often.  Cooking food until it is soft enough to chew easily.  Chewing your food well.  Drinking fluids slowly.  Not eating foods that are very spicy, sour, or fatty.  Not eating citrus fruits, such as oranges and grapefruit. What do I need to know about this diet?  Eat a variety of foods from the bland diet food list.  Do not follow a bland diet longer than needed.  Ask your health care provider whether you should take vitamins or supplements. What foods can I  eat? Grains Hot cereals, such as cream of wheat. Rice. Bread, crackers, or tortillas made from refined white flour.   Vegetables Canned or cooked vegetables. Mashed or boiled potatoes. Fruits Bananas. Applesauce. Other types of cooked or canned fruit with the skin and seeds removed, such as canned peaches or pears.   Meats and other proteins Scrambled eggs. Creamy peanut butter or other nut butters. Lean, well-cooked meats, such as chicken or fish. Tofu. Soups or broths.   Dairy Low-fat dairy products, such as milk, cottage cheese, or yogurt. Beverages Water. Herbal tea. Apple juice.   Fats and oils Mild salad dressings. Canola or olive oil. Sweets and desserts Pudding. Custard. Fruit gelatin. Ice cream. The items listed above may not be a complete list of recommended foods and beverages. Contact a dietitian for more options. What foods are not recommended? Grains Whole grain breads and cereals. Vegetables Raw vegetables. Fruits Raw fruits, especially citrus, berries, or dried fruits. Dairy Whole fat dairy foods. Beverages Caffeinated drinks. Alcohol. Seasonings and condiments Strongly flavored seasonings or condiments. Hot sauce. Salsa. Other foods Spicy foods. Fried foods. Sour foods, such as pickled or fermented foods. Foods with high sugar content. Foods high in fiber. The items listed above may not be a complete list of foods and beverages to avoid. Contact a dietitian for more information. Summary  A bland diet consists of foods that are often soft and do not have a lot of fat, fiber, or extra seasonings.  Foods without fat, fiber, or seasoning are easier for the body to digest.  Check with your health care provider to see how long you should follow this diet plan. It is not meant to be followed for long periods. This information is not intended  to replace advice given to you by your health care provider. Make sure you discuss any questions you have with your health  care provider. Document Revised: 07/22/2017 Document Reviewed: 07/22/2017 Elsevier Patient Education  2021 Elsevier Inc.   Consent to CCM Services: Mr. Ewbank was given information about Chronic Care Management services today including:  1. CCM service includes personalized support from designated clinical staff supervised by his physician, including individualized plan of care and coordination with other care providers 2. 24/7 contact phone numbers for assistance for urgent and routine care needs. 3. Service will only be billed when office clinical staff spend 20 minutes or more in a month to coordinate care. 4. Only one practitioner may furnish and bill the service in a calendar month. 5. The patient may stop CCM services at any time (effective at the end of the month) by phone call to the office staff. 6. The patient will be responsible for cost sharing (co-pay) of up to 20% of the service fee (after annual deductible is met).  Patient agreed to services and verbal consent obtained.   Patient verbalizes understanding of instructions provided today and agrees to view in MyChart.   Telephone follow up appointment with care management team member scheduled for: August 29, 2020 at 9:15 am  Caryl Pina, RN, BSN, CCRN [facility] RN Care Coordination- [facility] [phone]    CLINICAL CARE PLAN: Patient Care Plan: General Plan of Care (Adult)    Problem Identified: Health Promotion or Disease Self-Management (General Plan of Care)   Priority: Medium    Long-Range Goal: Self-Management Plan Developed   Start Date: 08/22/2020  Expected End Date: 10/21/2020  This Visit's Progress: On track  Priority: Medium  Note:   Current Barriers:  Marland Kitchen Knowledge Deficits related to plan of care for GI bleeding . Care Coordination needs related to initiation of home health services and PCP office visit scheduling post-hospital discharge in a patient with recent  hospitalization for GI bleeding . Unable to independently manage care; requires assistance from caregiver/ spouse for medications, transportation, and meal preparation Nurse Case Manager Clinical Goal(s):  Marland Kitchen Patient/ caregiver- spouse will verbalize understanding of plan for initiation of home health services . Patient/ caregiver- spouse will discuss with RN Care Manager ongoing self-health management around post-hospital discharge follow up for GI bleeding/ diarrhea . Patient will not experience hospital admission within next 30 days; Unplanned Hospital Admissions in last 6 months:1 . patient will attend all scheduled medical appointments: will schedule and attend PCP office visit; all established hemodialysis sessions (Tuesday/ Thursday/ Saturday); upcoming oncology appointment 08/31/20 and GI office visit 09/25/20 Interventions:  . 1:1 collaboration with Etta Grandchild, MD regarding development and update of comprehensive plan of care as evidenced by provider attestation and co-signature . Inter-disciplinary care team collaboration (see longitudinal plan of care) . Evaluation of current treatment plan related to recent GI bleeding and patient's adherence to plan as established by provider; Addressed EMMI Red-Alert notification received 08/22/20, post hospital discharge . Advised patient to schedule PCP appointment promptly; attend all provider appointments . Provided education to patient re: signs/ symptoms GI bleeding along with action plan for same; role BP plays in blood loss . Reviewed medications with patient and discussed use of previously prescribed polycarbophil: care coordination outreach placed to CCM Pharmacist and GI provider to obtain clarification of post-hospital use of this medication; no other medication concerns identified . Collaborated with inpatient CM team regarding home health orders placed 08/19/20 prior  to discharge home; contacted multiple home health agencies to initiate  services post- hospital discharge . Provided patient/ caregiver with educational materials related to GI bleeding and role of BP management in setting of GI bleeding . Reviewed scheduled/upcoming provider appointments including: need to schedule prompt PCP appointment post-hospital discharge; upcoming oncology appointment 08/31/20 and GI appointment 09/25/20 . Discussed plans with patient for ongoing care management follow up and provided patient with direct contact information for care management team Patient Goals/Self-Care Activities Over the next 60 days, patient will: . Patient will attend all scheduled provider appointments; keep a calendar with appointment dates  . Patient will call pharmacy for medication refills . Patient will continue to perform ADL's independently . Patient promptly will call provider office for new concerns or questions . Please continue to attend all regularly scheduled Hemodialysis sessions . Please contact your care providers promptly for any new concerns, issues, or problems that arise . I have asked someone from the [facility] PCP office to contact you to schedule an office visit appointment with Dr. Ronnald Ramp after your recent hospitalization- please listen out for this call, or call the office at [phone] to schedule this appointment yourself  Follow Up Plan:  . Telephone follow up appointment with care management team member scheduled for: August 29, 2020 between 9:00-10:00 am . The patient has been provided with contact information for the care management team and has been advised to call with any health related questions or concerns.       Patient Care Plan: Hypertension (Adult)    Problem Identified: Hypertension (Hypertension)   Priority: Medium    Long-Range Goal: Hypertension Monitored   Start Date: 08/22/2020  Expected End Date: 10/21/2020  This Visit's Progress: On track  Priority: Medium  Note:   Objective:  . Last practice recorded  BP readings:  BP Readings from Last 3 Encounters:  08/20/20 (!) 118/50  08/19/20 122/64  08/13/20 (!) 146/78   . Most recent eGFR/CrCl: No results found for: EGFR  No components found for: CRCL Current Barriers:  Marland Kitchen Knowledge Deficits related to basic understanding of hypertension pathophysiology and self care management . Unable to independently verbalize usual blood pressure readings at home Case Manager Clinical Goal(s):  Marland Kitchen Over the next 60 days, patient/ caregiver- spouse will verbalize prescribed treatment plan for hypertension as evidenced by taking all medications as prescribed, monitoring and recording blood pressure as directed, adhering to low sodium/DASH diet, as evidenced by review of all during RN CM outreach Interventions:  . Collaboration with [name], MD regarding development and update of comprehensive plan of care as evidenced by provider attestation and co-signature . Inter-disciplinary care team collaboration (see longitudinal plan of care) . UNABLE to independently: verbalize usual blood pressure readings at home; role of blood pressure measurements in setting of GI bleeding concerns . Reviewed medications with patient and discussed importance of compliance . Discussed plans with patient for ongoing care management follow up and provided patient with direct contact information for care management team . Advised patient, providing education and rationale, to monitor blood pressure daily and record, calling PCP for any concerns.  Patient Goals/Self-Care Activities Over the next 60 days, patient will:  . Check blood pressure daily . Write blood pressure results in a log or diary- we will review these when we talk by phone next week  . Call provider office for new concerns, questions, or any concerns witjh BP at home . Continue following a low sodium diet/DASH diet Follow  Up Plan: Telephone follow up appointment with care management team member scheduled for: 08/29/20  at 9:15 am    Oneta Rack, RN, BSN, [place] [phone]

## 2020-08-22 NOTE — Chronic Care Management (AMB) (Signed)
Chronic Care Management   CCM RN Visit Note  08/22/2020 Name: Jason POLYAK Sr. MRN: [id number] DOB: 10/22/1945  Subjective: Jason Cotta Sr. is a 75 y.o. year old male who is a primary care patient of [name], MD. The care management team was consulted for assistance with disease management and care coordination needs.    Engaged with patient by telephone for initial visit in response to provider referral for case management and/or care coordination services.   Consent to Services:  The patient was given information about Chronic Care Management services, agreed to services, and gave verbal consent 01/11/20 prior to initiation of services.  Please see initial visit note for detailed documentation.   Patient agreed to services and verbal consent obtained.   Assessment: Review of patient past medical history, allergies, medications, health status, including review of consultants reports, laboratory and other test data, was performed as part of comprehensive evaluation and provision of chronic care management services.   SDOH (Social Determinants of Health) assessments and interventions performed:  SDOH Interventions   Flowsheet Row Most Recent Value  SDOH Interventions   Food Insecurity Interventions Intervention Not Indicated  Financial Strain Interventions Intervention Not Indicated  Transportation Interventions Intervention Not Indicated      CCM Care Plan  Allergies  Allergen Reactions  . Nsaids Other (See Comments)    Asked by surgeon to add this medication class as intolerance due to patients renal insufficiency.  . Lactase Diarrhea  . Lactose Intolerance (Gi) Diarrhea  . Phentermine Other (See Comments)    Unknown reaction  . Amlodipine Other (See Comments)    Edema     Outpatient Encounter Medications as of 08/22/2020  Medication Sig  . acetaminophen (TYLENOL) 325 MG tablet Take 650 mg by mouth every 4 (four) hours as needed for mild pain or moderate pain.    Marland Kitchen atorvastatin (LIPITOR) 20 MG tablet Take 1 tablet (20 mg total) by mouth every evening.  . cinacalcet (SENSIPAR) 60 MG tablet Take 60 mg by mouth daily.  . diphenoxylate-atropine (LOMOTIL) 2.5-0.025 MG tablet Take 1 tablet by mouth 4 (four) times daily.  Marland Kitchen FOSRENOL 1000 MG PACK Take 1,000 mg by mouth 3 (three) times daily with meals.   . gabapentin (NEURONTIN) 100 MG capsule TAKE 1 CAPSULE BY MOUTH THREE TIMES DAILY (Patient taking differently: Take 100 mg by mouth 3 (three) times daily.)  . lipase/protease/amylase (CREON) 36000 UNITS CPEP capsule Take 2 capsules (72,000 Units total) by mouth 3 (three) times daily with meals. May also take 1 capsule (36,000 Units total) as needed (with snacks).  . ondansetron (ZOFRAN) 4 MG tablet TAKE ONE TABLET BY MOUTH EVERY 8 HOURS AS NEEDED  . oxyCODONE (OXY IR/ROXICODONE) 5 MG immediate release tablet Take 1 tablet (5 mg total) by mouth every 6 (six) hours as needed for moderate pain.  . pantoprazole (PROTONIX) 40 MG tablet Take 1 tablet (40 mg total) by mouth 2 (two) times daily.  . polycarbophil (FIBERCON) 625 MG tablet Take 625 mg by mouth as needed for diarrhea or loose stools.  . warfarin (COUMADIN) 5 MG tablet Take 5 mg on Sun, Tue, Wed, Thurs, and Sat. Take 2.5 mg on Mon and Fri.   No facility-administered encounter medications on file as of 08/22/2020.    Patient Active Problem List   Diagnosis Date Noted  . GI bleed 08/15/2020  . Acute blood loss anemia 08/15/2020  . Supratherapeutic INR 08/15/2020  . Fibula fracture 08/15/2020  . Melena   .  Acute gastric ulcer with hemorrhage   . Leg edema, right 08/13/2020  . Injury of right leg 08/13/2020  . Closed nondisplaced segmental fracture of shaft of right fibula 08/13/2020  . Cellulitis of right leg 08/06/2020  . Complication of vascular dialysis catheter 04/18/2020  . Other disorders of phosphorus metabolism 04/04/2020  . Allergy, unspecified, initial encounter 03/01/2020  . Anaphylactic  shock, unspecified, initial encounter 03/01/2020  . Fever 01/16/2020  . Benign prostatic hyperplasia without lower urinary tract symptoms 12/12/2019  . Meralgia paraesthetica, right 08/12/2019  . 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 Gait disturbance, post-stroke 04/17/2019  . Iron deficiency anemia, unspecified 03/14/2019  . Chronic obstructive pulmonary disease, unspecified (HCC) 01/25/2019  . Coagulopathy (HCC) 01/25/2019  . Diarrhea, unspecified 01/25/2019  . Hypertensive chronic kidney disease with stage 5 chronic kidney disease or end stage renal disease (HCC) 01/25/2019  . Diverticulosis of intestine, part unspecified, without perforation or abscess without bleeding 01/25/2019  . Other idiopathic peripheral autonomic neuropathy 01/25/2019  . Hypokalemia 01/25/2019  . Male erectile dysfunction, unspecified 01/25/2019  . Pain, unspecified 01/25/2019  . Primary generalized (osteo)arthritis 01/25/2019  . Pruritus, unspecified 01/25/2019  . Secondary hyperparathyroidism of renal origin (HCC) 01/25/2019  . Anticoagulated 12/31/2018  . Paroxysmal atrial fibrillation (HCC) 12/22/2018  . Longstanding persistent atrial fibrillation (HCC) 09/20/2018  . Functional diarrhea 07/14/2018  . Benign essential HTN   . ESRD on dialysis (HCC) 06/16/2018  . Mild malnutrition (HCC) 06/16/2018  . Stroke due to embolism (HCC) 06/16/2018  . Chronic HFrEF (heart failure with reduced ejection fraction) (HCC)   . CVA (cerebral vascular accident) (HCC) 06/08/2018  . CHF (congestive heart failure) (HCC) 05/08/2018  . Hyperlipidemia LDL goal <70 09/23/2017  . OAB (overactive bladder) 01/09/2016  . SUI (stress urinary incontinence), male 01/09/2016  . Hypersomnia 10/30/2015  . Obesity (BMI 30.0-34.9) 10/30/2015  . Routine general medical examination at a health care facility 06/04/2015  . Medicare annual wellness visit, subsequent 06/04/2015  . Hereditary and idiopathic peripheral  neuropathy 09/14/2014  . Rectal cancer (HCC) 02/01/2013  . Obstructive sleep apnea 07/29/2010  . GOUT 05/11/2007  . ERECTILE DYSFUNCTION, MILD 05/11/2007  . Essential hypertension 05/11/2007    Conditions to be addressed/monitored:HTN, ESRD and GI bleeding  Care Plan : General Plan of Care (Adult)  Updates made by Michaela Corner, RN since 08/22/2020 12:00 AM    Problem: Health Promotion or Disease Self-Management (General Plan of Care)   Priority: Medium    Long-Range Goal: Self-Management Plan Developed   Start Date: 08/22/2020  Expected End Date: 10/21/2020  This Visit's Progress: On track  Priority: Medium  Note:   Current Barriers:  Marland Kitchen Knowledge Deficits related to plan of care for GI bleeding . Care Coordination needs related to initiation of home health services and PCP office visit scheduling post-hospital discharge in a patient with recent hospitalization for GI bleeding . Unable to independently manage care; requires assistance from caregiver/ spouse for medications, transportation, and meal preparation Nurse Case Manager Clinical Goal(s):  Marland Kitchen Patient/ caregiver- spouse will verbalize understanding of plan for initiation of home health services . Patient/ caregiver- spouse will discuss with RN Care Manager ongoing self-health management around post-hospital discharge follow up for GI bleeding/ diarrhea . Patient will not experience hospital admission within next 30 days; Unplanned Hospital Admissions in last 6 months:1 . patient will attend all scheduled medical appointments: will schedule and attend PCP office visit; all established hemodialysis sessions (Tuesday/ Thursday/ Saturday); upcoming oncology appointment  08/31/20 and GI office visit 09/25/20 Interventions:  . 1:1 collaboration with [name], MD regarding development and update of comprehensive plan of care as evidenced by provider attestation and co-signature . Inter-disciplinary care team collaboration (see  longitudinal plan of care) . Evaluation of current treatment plan related to recent GI bleeding and patient's adherence to plan as established by provider: Addressed EMMI Red-Alert notification received 08/22/20, post hospital discharge . Advised patient to schedule PCP appointment promptly; attend all provider appointments . Provided education to patient re: signs/ symptoms GI bleeding along with action plan for same; role BP plays in blood loss . Reviewed medications with patient and discussed use of previously prescribed polycarbophil: care coordination outreach placed to CCM Pharmacist and GI provider to obtain clarification of post-hospital use of this medication; no other medication concerns identified . Collaborated with inpatient CM team regarding home health orders placed 08/19/20 prior to discharge home; contacted multiple home health agencies to initiate services post- hospital discharge . Provided patient/ caregiver with educational materials related to GI bleeding and role of BP management in setting of GI bleeding . Reviewed scheduled/upcoming provider appointments including: need to schedule prompt PCP appointment post-hospital discharge; upcoming oncology appointment 08/31/20 and GI appointment 09/25/20 . Discussed plans with patient for ongoing care management follow up and provided patient with direct contact information for care management team Patient Goals/Self-Care Activities Over the next 60 days, patient will: . Patient will attend all scheduled provider appointments; keep a calendar with appointment dates  . Patient will call pharmacy for medication refills . Patient will continue to perform ADL's independently . Patient promptly will call provider office for new concerns or questions . Please continue to attend all regularly scheduled Hemodialysis sessions . Please contact your care providers promptly for any new concerns, issues, or problems that arise . I have asked someone  from the [facility] PCP office to contact you to schedule an office visit appointment with Dr. Ronnald Ramp after your recent hospitalization- please listen out for this call, or call the office at [phone] to schedule this appointment yourself  Follow Up Plan:  . Telephone follow up appointment with care management team member scheduled for: August 29, 2020 between 9:00-10:00 am . The patient has been provided with contact information for the care management team and has been advised to call with any health related questions or concerns.       Care Plan : Hypertension (Adult)  Updates made by Knox Royalty, RN since 08/22/2020 12:00 AM    Problem: Hypertension (Hypertension)   Priority: Medium    Long-Range Goal: Hypertension Monitored   Start Date: 08/22/2020  Expected End Date: 10/21/2020  This Visit's Progress: On track  Priority: Medium  Note:   Objective:  . Last practice recorded BP readings:  BP Readings from Last 3 Encounters:  08/20/20 (!) 118/50  08/19/20 122/64  08/13/20 (!) 146/78   . Most recent eGFR/CrCl: No results found for: EGFR  No components found for: CRCL Current Barriers:  Marland Kitchen Knowledge Deficits related to basic understanding of hypertension pathophysiology and self care management . Unable to independently verbalize usual blood pressure readings at home Case Manager Clinical Goal(s):  Marland Kitchen Over the next 60 days, patient/ caregiver- spouse will verbalize prescribed treatment plan for hypertension as evidenced by taking all medications as prescribed, monitoring and recording blood pressure as directed, adhering to low sodium/DASH diet, as evidenced by review of all during RN CM outreach Interventions:  . Collaboration with Ronnald Ramp,  Arvid Right, MD regarding development and update of comprehensive plan of care as evidenced by provider attestation and co-signature . Inter-disciplinary care team collaboration (see longitudinal plan of care) . UNABLE to  independently: verbalize usual blood pressure readings at home; role of blood pressure measurements in setting of GI bleeding concerns . Reviewed medications with patient and discussed importance of compliance . Discussed plans with patient for ongoing care management follow up and provided patient with direct contact information for care management team . Advised patient, providing education and rationale, to monitor blood pressure daily and record, calling PCP for any concerns.  Patient Goals/Self-Care Activities Over the next 60 days, patient will:  . Check blood pressure daily . Write blood pressure results in a log or diary- we will review these when we talk by phone next week  . Call provider office for new concerns, questions, or any concerns witjh BP at home . Continue following a low sodium diet/DASH diet Follow Up Plan: Telephone follow up appointment with care management team member scheduled for: 08/29/20 at 9:15 am     Plan:Telephone follow up appointment with care management team member scheduled for:  August 29, 2020 at 9:15 am   Oneta Rack, RN, BSN, [place] [phone]

## 2020-08-23 ENCOUNTER — Ambulatory Visit: Payer: Self-pay | Admitting: *Deleted

## 2020-08-23 ENCOUNTER — Other Ambulatory Visit (INDEPENDENT_AMBULATORY_CARE_PROVIDER_SITE_OTHER): Payer: Medicare Other

## 2020-08-23 DIAGNOSIS — K529 Noninfective gastroenteritis and colitis, unspecified: Secondary | ICD-10-CM

## 2020-08-23 DIAGNOSIS — I10 Essential (primary) hypertension: Secondary | ICD-10-CM

## 2020-08-23 DIAGNOSIS — R634 Abnormal weight loss: Secondary | ICD-10-CM

## 2020-08-23 DIAGNOSIS — R1013 Epigastric pain: Secondary | ICD-10-CM | POA: Diagnosis not present

## 2020-08-23 DIAGNOSIS — N186 End stage renal disease: Secondary | ICD-10-CM

## 2020-08-23 LAB — CBC WITH DIFFERENTIAL/PLATELET
Basophils Absolute: 0.1 10*3/uL (ref 0.0–0.1)
Basophils Relative: 0.4 % (ref 0.0–3.0)
Eosinophils Absolute: 0.1 10*3/uL (ref 0.0–0.7)
Eosinophils Relative: 0.6 % (ref 0.0–5.0)
HCT: 26.5 % — ABNORMAL LOW (ref 39.0–52.0)
Hemoglobin: 8.9 g/dL — ABNORMAL LOW (ref 13.0–17.0)
Lymphocytes Relative: 8.4 % — ABNORMAL LOW (ref 12.0–46.0)
Lymphs Abs: 1.1 10*3/uL (ref 0.7–4.0)
MCHC: 33.5 g/dL (ref 30.0–36.0)
MCV: 89.1 fl (ref 78.0–100.0)
Monocytes Absolute: 1.5 10*3/uL — ABNORMAL HIGH (ref 0.1–1.0)
Monocytes Relative: 11.4 % (ref 3.0–12.0)
Neutro Abs: 10.3 10*3/uL — ABNORMAL HIGH (ref 1.4–7.7)
Neutrophils Relative %: 79.2 % — ABNORMAL HIGH (ref 43.0–77.0)
Platelets: 447 10*3/uL — ABNORMAL HIGH (ref 150.0–400.0)
RBC: 2.97 Mil/uL — ABNORMAL LOW (ref 4.22–5.81)
RDW: 17.9 % — ABNORMAL HIGH (ref 11.5–15.5)
WBC: 13.1 10*3/uL — ABNORMAL HIGH (ref 4.0–10.5)

## 2020-08-23 NOTE — Telephone Encounter (Signed)
Patient has completed labs and stool studies.

## 2020-08-23 NOTE — Telephone Encounter (Signed)
Lab orders have been entered. Patient has been scheduled for CT angiogram of the abdomen at [facility] on Wednesday, 08/29/20 at 5:30 pm, arrive at 5 PM. Liquids only 4 hours before. Patient has dialysis on Tuesday morning and unable to make the appt offered on this day.   Spoke with patient's wife in regards to Dr. Loletha Carrow' further recommendations. Wife states that patient was feeling kind of weak today but will see if he is up for coming in for the lab work and to pick up the stool kits. She is aware of CT scan appointment. Wife verbalized understanding of all information and had no concerns at the end of the call.

## 2020-08-23 NOTE — Addendum Note (Signed)
Addended by: Yevette Edwards on: 08/23/2020 02:20 PM   Modules accepted: Orders

## 2020-08-23 NOTE — Telephone Encounter (Signed)
I remain very concerned about Jason [name] and would like to arrange the following to be done as soon as possible (by early next week):  CBC and vitamin B3 (niacin) level, prealbumin  Stool for C. difficile GDH antigen and toxin A/B as well as ova and parasite screen  CT angiogram of abdomen for abdominal pain, diarrhea, weight loss, rule out SMA stenosis. This patient has end-stage renal disease on dialysis, so he does not need preprocedure creatinine level because it is chronically elevated.   - HD

## 2020-08-23 NOTE — Patient Instructions (Signed)
Visit Information  PATIENT GOALS: Goals Addressed            This Visit's Progress   . Make and Keep All Appointments   On track    Timeframe:  Long-Range Goal Priority:  Medium Start Date:     08/22/20                        Expected End Date:   10/17/20                    Follow Up Date: 08/29/20  08/23/20:  -- I am glad that you were able to get a scheduled office visit with NP Mickel Baas at Dr. Ronnald Ramp office for Monday 08/27/20 at 1:20 pm.  I am going to message the NP that will be seeing you to request that she follow up to make sure home health services are ordered since you have not heard from the home health agency yet.  Please ask the NP about this during your office visit on Monday 08/27/20.  Be sure to write down the name and agency of the home health team if the NP approves this request when you see her on 08/27/20 -- I communicated with Dr. Loletha Carrow (GI provider) around use of previously prescribed polycarbophil; Dr. Loletha Carrow advised that it is up to the patient whether he takes this medication; please update your care providers whether or not you are taking this medication when you visit with them  . Keep a calendar with appointment dates  . Please continue to attend all regularly scheduled Hemodialysis sessions . Please contact your care providers promptly for any new concerns, issues, or problems that arise . I have asked someone from the [facility] PCP office to contact you to schedule an office visit appointment with Dr. Ronnald Ramp after your recent hospitalization- please listen out for this call, or call the office at [phone] to schedule this appointment yourself   Why is this important?    Part of staying healthy is seeing the doctor for follow-up care.   If you forget your appointments, there are some things you can do to stay on track.        . Track and Manage My Blood Pressure-Hypertension   On track    Timeframe:  Long-Range Goal Priority:  Medium Start Date:      08/22/20                        Expected End Date:    10/17/20                   Follow Up Date: 08/29/20  . Check blood pressure daily . Write blood pressure results in a log or diary- we will review these when we talk by phone next week  . Call provider office for new concerns, questions, or any concerns witjh BP at home . Continue following a low sodium diet/DASH diet 08/23/20: reminded caregiver to begin recording blood pressures for our review together next week   Why is this important?    You won't feel high blood pressure, but it can still hurt your blood vessels.   High blood pressure can cause heart or kidney problems. It can also cause a stroke.   Making lifestyle changes like losing a little weight or eating less salt will help.   Checking your blood pressure at home and at different times of the day  can help to control blood pressure.   If the doctor prescribes medicine remember to take it the way the doctor ordered.   Call the office if you cannot afford the medicine or if there are questions about it.            Patient verbalizes understanding of instructions provided today and agrees to view in [place].    Telephone follow up appointment with care management team member scheduled for: Wednesday 08/29/20 at 9:15 am  The patient has been provided with contact information for the care management team and has been advised to call with any health related questions or concerns.   Oneta Rack, RN, BSN, [facility] RN Care Coordination- [place] [phone]

## 2020-08-23 NOTE — Chronic Care Management (AMB) (Signed)
Chronic Care Management   CCM RN Visit Note  08/23/2020 Name: Jason [name] Sr. MRN: [id number] DOB: Nov 05, 1945  Subjective: Jason Cotta Sr. is a 75 y.o. year old male who is a primary care patient of [name], MD. The care management team was consulted for assistance with disease management and care coordination needs.    Engaged with patient's caregiver/ spouse by telephone for follow up visit in response to provider referral for case management and/or care coordination services.   Consent to Services:  The patient was given information about Chronic Care Management services, agreed to services, and gave verbal consent prior to initiation of services.  Please see initial visit note for detailed documentation.   Patient agreed to services and verbal consent obtained.   Assessment: Review of patient past medical history, allergies, medications, health status, including review of consultants reports, laboratory and other test data, was performed as part of comprehensive evaluation and provision of chronic care management services.   SDOH (Social Determinants of Health) assessments and interventions performed:    CCM Care Plan  Allergies  Allergen Reactions  . Nsaids Other (See Comments)    Asked by surgeon to add this medication class as intolerance due to patients renal insufficiency.  . Lactase Diarrhea  . Lactose Intolerance (Gi) Diarrhea  . Phentermine Other (See Comments)    Unknown reaction  . Amlodipine Other (See Comments)    Edema     Outpatient Encounter Medications as of 08/23/2020  Medication Sig  . acetaminophen (TYLENOL) 325 MG tablet Take 650 mg by mouth every 4 (four) hours as needed for mild pain or moderate pain.   Marland Kitchen atorvastatin (LIPITOR) 20 MG tablet Take 1 tablet (20 mg total) by mouth every evening.  . cinacalcet (SENSIPAR) 60 MG tablet Take 60 mg by mouth daily.  . diphenoxylate-atropine (LOMOTIL) 2.5-0.025 MG tablet Take 1 tablet by mouth 4 (four)  times daily.  Marland Kitchen FOSRENOL 1000 MG PACK Take 1,000 mg by mouth 3 (three) times daily with meals.   . gabapentin (NEURONTIN) 100 MG capsule TAKE 1 CAPSULE BY MOUTH THREE TIMES DAILY (Patient taking differently: Take 100 mg by mouth 3 (three) times daily.)  . lipase/protease/amylase (CREON) 36000 UNITS CPEP capsule Take 2 capsules (72,000 Units total) by mouth 3 (three) times daily with meals. May also take 1 capsule (36,000 Units total) as needed (with snacks).  . ondansetron (ZOFRAN) 4 MG tablet TAKE ONE TABLET BY MOUTH EVERY 8 HOURS AS NEEDED  . oxyCODONE (OXY IR/ROXICODONE) 5 MG immediate release tablet Take 1 tablet (5 mg total) by mouth every 6 (six) hours as needed for moderate pain.  . pantoprazole (PROTONIX) 40 MG tablet Take 1 tablet (40 mg total) by mouth 2 (two) times daily.  . polycarbophil (FIBERCON) 625 MG tablet Take 625 mg by mouth as needed for diarrhea or loose stools.  . warfarin (COUMADIN) 5 MG tablet Take 5 mg on Sun, Tue, Wed, Thurs, and Sat. Take 2.5 mg on Mon and Fri.   No facility-administered encounter medications on file as of 08/23/2020.    Patient Active Problem List   Diagnosis Date Noted  . GI bleed 08/15/2020  . Acute blood loss anemia 08/15/2020  . Supratherapeutic INR 08/15/2020  . Fibula fracture 08/15/2020  . Melena   . Acute gastric ulcer with hemorrhage   . Leg edema, right 08/13/2020  . Injury of right leg 08/13/2020  . Closed nondisplaced segmental fracture of shaft of right fibula 08/13/2020  .  Cellulitis of right leg 08/06/2020  . Complication of vascular dialysis catheter 04/18/2020  . Other disorders of phosphorus metabolism 04/04/2020  . Allergy, unspecified, initial encounter 03/01/2020  . Anaphylactic shock, unspecified, initial encounter 03/01/2020  . Fever 01/16/2020  . Benign prostatic hyperplasia without lower urinary tract symptoms 12/12/2019  . Meralgia paraesthetica, right 08/12/2019  . 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 Gait disturbance, post-stroke 04/17/2019  . Iron deficiency anemia, unspecified 03/14/2019  . Chronic obstructive pulmonary disease, unspecified ([place]) 01/25/2019  . Coagulopathy ([place]) 01/25/2019  . Diarrhea, unspecified 01/25/2019  . Hypertensive chronic kidney disease with stage 5 chronic kidney disease or end stage renal disease ([place]) 01/25/2019  . Diverticulosis of intestine, part unspecified, without perforation or abscess without bleeding 01/25/2019  . Other idiopathic peripheral autonomic neuropathy 01/25/2019  . Hypokalemia 01/25/2019  . Male erectile dysfunction, unspecified 01/25/2019  . Pain, unspecified 01/25/2019  . Primary generalized (osteo)arthritis 01/25/2019  . Pruritus, unspecified 01/25/2019  . Secondary hyperparathyroidism of renal origin ([place]) 01/25/2019  . Anticoagulated 12/31/2018  . Paroxysmal atrial fibrillation ([place]) 12/22/2018  . Longstanding persistent atrial fibrillation ([place]) 09/20/2018  . Functional diarrhea 07/14/2018  . Benign essential HTN   . ESRD on dialysis ([place]) 06/16/2018  . Mild malnutrition ([place]) 06/16/2018  . Stroke due to embolism ([place]) 06/16/2018  . Chronic HFrEF (heart failure with reduced ejection fraction) ([place])   . CVA (cerebral vascular accident) ([place]) 06/08/2018  . CHF (congestive heart failure) ([place]) 05/08/2018  . Hyperlipidemia LDL goal <70 09/23/2017  . OAB (overactive bladder) 01/09/2016  . SUI (stress urinary incontinence), male 01/09/2016  . Hypersomnia 10/30/2015  . Obesity (BMI 30.0-34.9) 10/30/2015  . Routine general medical examination at a health care facility 06/04/2015  . Medicare annual wellness visit, subsequent 06/04/2015  . Hereditary and idiopathic peripheral neuropathy 09/14/2014  . Rectal cancer ([place]) 02/01/2013  . Obstructive sleep apnea 07/29/2010  . GOUT 05/11/2007  . ERECTILE DYSFUNCTION, MILD 05/11/2007  . Essential hypertension 05/11/2007    Conditions to be addressed/monitored:HTN and  ESRD  Care Plan : General Plan of Care (Adult)  Updates made by Knox Royalty, RN since 08/23/2020 12:00 AM    Problem: Health Promotion or Disease Self-Management (General Plan of Care)   Priority: Medium    Long-Range Goal: Self-Management Plan Developed   Start Date: 08/22/2020  Expected End Date: 10/21/2020  Recent Progress: On track  Priority: Medium  Note:   Current Barriers:  Marland Kitchen Knowledge Deficits related to plan of care for GI bleeding . Care Coordination needs related to initiation of home health services and PCP office visit scheduling post-hospital discharge in a patient with recent hospitalization for GI bleeding . Unable to independently manage care; requires assistance from caregiver/ spouse for medications, transportation, and meal preparation Nurse Case Manager Clinical Goal(s):  Marland Kitchen Patient/ caregiver- spouse will verbalize understanding of plan for initiation of home health services . Patient/ caregiver- spouse will discuss with RN Care Manager ongoing self-health management around post-hospital discharge follow up for GI bleeding/ diarrhea . Patient will not experience hospital admission within next 30 days; Unplanned Hospital Admissions in last 6 months:1 . patient will attend all scheduled medical appointments: will schedule and attend PCP office visit; all established hemodialysis sessions (Tuesday/ Thursday/ Saturday); upcoming oncology appointment 08/31/20 and GI office visit 09/25/20 Interventions:  . 1:1 collaboration with [name], MD regarding development and update of comprehensive plan of care as evidenced by provider attestation and co-signature . Inter-disciplinary care team  collaboration (see longitudinal plan of care) . Confirmed patient now has scheduled post-hospital discharge PCP appointment Monday 08/27/20; sent care coordination message to NP Jodi Mourning, informing of delay of initiation of home health services and requesting that she re-order as  indicated when she evaluates patient during scheduled post-hospital office visit on 08/27/20 . Confirmed with caregiver that I communicated with GI provider around use of previously prescribed polycarbophil, and informed that GI provider Dr. Loletha Carrow advised that it is up to patient whether he takes this medication; caregiver verbalized understanding of same . Discussed plans with patient for ongoing care management follow up and provided caregiver with direct contact information for care management team Patient Goals/Self-Care Activities Over the next 60 days, patient will: . Patient will attend all scheduled provider appointments; keep a calendar with appointment dates  . Patient will call pharmacy for medication refills . Patient will continue to perform ADL's independently . Patient promptly will call provider office for new concerns or questions . Please continue to attend all regularly scheduled Hemodialysis sessions . Please contact your care providers promptly for any new concerns, issues, or problems that arise 08/23/20:  -- I am glad that you were able to get a scheduled office visit with NP Mickel Baas at Dr. Ronnald Ramp office for Monday 08/27/20 at 1:20 pm.  I am going to message the NP that will be seeing you to request that she follow up to make sure home health services are ordered since you have not heard from the home health agency yet.  Please ask the NP about this during your office visit on Monday 08/27/20.  Be sure to write down the name and agency of the home health team if the NP approves this request when you see her on 08/27/20 -- I communicated with Dr, Loletha Carrow (GI provider) around use of previously prescribed polycarbophil, as we discussed, Dr. Loletha Carrow advised that it is up to the patient whether he takes this medication Follow Up Plan:  . Telephone follow up appointment with care management team member scheduled for: August 29, 2020 between 9:00-10:00 am . The patient has been provided with  contact information for the care management team and has been advised to call with any health related questions or concerns.         Plan:Telephone follow up appointment with care management team member scheduled for:  Wednesday,  08/29/20 at 9:15 am  Oneta Rack, RN, BSN, [place] [phone]

## 2020-08-23 NOTE — Telephone Encounter (Signed)
The labs and the stool studies can definitely wait until next week if Jason [name] is not feeling up to a trip out of the house today.

## 2020-08-24 ENCOUNTER — Other Ambulatory Visit: Payer: Self-pay

## 2020-08-24 ENCOUNTER — Encounter (HOSPITAL_COMMUNITY): Payer: Self-pay | Admitting: Emergency Medicine

## 2020-08-24 ENCOUNTER — Telehealth: Payer: Self-pay | Admitting: Internal Medicine

## 2020-08-24 ENCOUNTER — Inpatient Hospital Stay (HOSPITAL_COMMUNITY): Payer: Medicare Other

## 2020-08-24 ENCOUNTER — Inpatient Hospital Stay (HOSPITAL_COMMUNITY)
Admission: EM | Admit: 2020-08-24 | Discharge: 2020-09-16 | DRG: 371 | Disposition: A | Discharge status: Hospice | Payer: Medicare Other | Attending: Student | Admitting: Student

## 2020-08-24 ENCOUNTER — Telehealth: Payer: Self-pay | Admitting: Gastroenterology

## 2020-08-24 DIAGNOSIS — K2970 Gastritis, unspecified, without bleeding: Secondary | ICD-10-CM | POA: Diagnosis not present

## 2020-08-24 DIAGNOSIS — A0472 Enterocolitis due to Clostridium difficile, not specified as recurrent: Secondary | ICD-10-CM | POA: Diagnosis present

## 2020-08-24 DIAGNOSIS — Z515 Encounter for palliative care: Secondary | ICD-10-CM

## 2020-08-24 DIAGNOSIS — T45515A Adverse effect of anticoagulants, initial encounter: Secondary | ICD-10-CM | POA: Diagnosis present

## 2020-08-24 DIAGNOSIS — I132 Hypertensive heart and chronic kidney disease with heart failure and with stage 5 chronic kidney disease, or end stage renal disease: Secondary | ICD-10-CM | POA: Diagnosis not present

## 2020-08-24 DIAGNOSIS — Z20822 Contact with and (suspected) exposure to covid-19: Secondary | ICD-10-CM | POA: Diagnosis present

## 2020-08-24 DIAGNOSIS — Z79899 Other long term (current) drug therapy: Secondary | ICD-10-CM

## 2020-08-24 DIAGNOSIS — Z7901 Long term (current) use of anticoagulants: Secondary | ICD-10-CM

## 2020-08-24 DIAGNOSIS — Y842 Radiological procedure and radiotherapy as the cause of abnormal reaction of the patient, or of later complication, without mention of misadventure at the time of the procedure: Secondary | ICD-10-CM | POA: Diagnosis present

## 2020-08-24 DIAGNOSIS — K559 Vascular disorder of intestine, unspecified: Secondary | ICD-10-CM | POA: Diagnosis not present

## 2020-08-24 DIAGNOSIS — K922 Gastrointestinal hemorrhage, unspecified: Secondary | ICD-10-CM | POA: Diagnosis not present

## 2020-08-24 DIAGNOSIS — Z823 Family history of stroke: Secondary | ICD-10-CM

## 2020-08-24 DIAGNOSIS — Z87891 Personal history of nicotine dependence: Secondary | ICD-10-CM

## 2020-08-24 DIAGNOSIS — R54 Age-related physical debility: Secondary | ICD-10-CM | POA: Diagnosis present

## 2020-08-24 DIAGNOSIS — I480 Paroxysmal atrial fibrillation: Secondary | ICD-10-CM | POA: Diagnosis not present

## 2020-08-24 DIAGNOSIS — N186 End stage renal disease: Secondary | ICD-10-CM | POA: Diagnosis present

## 2020-08-24 DIAGNOSIS — K2990 Gastroduodenitis, unspecified, without bleeding: Secondary | ICD-10-CM | POA: Diagnosis not present

## 2020-08-24 DIAGNOSIS — K567 Ileus, unspecified: Secondary | ICD-10-CM | POA: Diagnosis not present

## 2020-08-24 DIAGNOSIS — T85598A Other mechanical complication of other gastrointestinal prosthetic devices, implants and grafts, initial encounter: Secondary | ICD-10-CM

## 2020-08-24 DIAGNOSIS — R64 Cachexia: Secondary | ICD-10-CM | POA: Diagnosis not present

## 2020-08-24 DIAGNOSIS — K624 Stenosis of anus and rectum: Secondary | ICD-10-CM | POA: Diagnosis present

## 2020-08-24 DIAGNOSIS — Z7189 Other specified counseling: Secondary | ICD-10-CM | POA: Diagnosis not present

## 2020-08-24 DIAGNOSIS — Z85048 Personal history of other malignant neoplasm of rectum, rectosigmoid junction, and anus: Secondary | ICD-10-CM

## 2020-08-24 DIAGNOSIS — I10 Essential (primary) hypertension: Secondary | ICD-10-CM | POA: Diagnosis not present

## 2020-08-24 DIAGNOSIS — D6832 Hemorrhagic disorder due to extrinsic circulating anticoagulants: Secondary | ICD-10-CM | POA: Diagnosis present

## 2020-08-24 DIAGNOSIS — E52 Niacin deficiency [pellagra]: Secondary | ICD-10-CM | POA: Diagnosis present

## 2020-08-24 DIAGNOSIS — Z681 Body mass index (BMI) 19 or less, adult: Secondary | ICD-10-CM

## 2020-08-24 DIAGNOSIS — R112 Nausea with vomiting, unspecified: Secondary | ICD-10-CM | POA: Diagnosis not present

## 2020-08-24 DIAGNOSIS — R197 Diarrhea, unspecified: Secondary | ICD-10-CM | POA: Diagnosis not present

## 2020-08-24 DIAGNOSIS — E869 Volume depletion, unspecified: Secondary | ICD-10-CM | POA: Diagnosis not present

## 2020-08-24 DIAGNOSIS — Z902 Acquired absence of lung [part of]: Secondary | ICD-10-CM

## 2020-08-24 DIAGNOSIS — E785 Hyperlipidemia, unspecified: Secondary | ICD-10-CM | POA: Diagnosis present

## 2020-08-24 DIAGNOSIS — D631 Anemia in chronic kidney disease: Secondary | ICD-10-CM | POA: Diagnosis present

## 2020-08-24 DIAGNOSIS — E876 Hypokalemia: Secondary | ICD-10-CM | POA: Diagnosis not present

## 2020-08-24 DIAGNOSIS — K269 Duodenal ulcer, unspecified as acute or chronic, without hemorrhage or perforation: Secondary | ICD-10-CM

## 2020-08-24 DIAGNOSIS — K626 Ulcer of anus and rectum: Secondary | ICD-10-CM | POA: Diagnosis not present

## 2020-08-24 DIAGNOSIS — R1084 Generalized abdominal pain: Secondary | ICD-10-CM | POA: Diagnosis not present

## 2020-08-24 DIAGNOSIS — K56609 Unspecified intestinal obstruction, unspecified as to partial versus complete obstruction: Secondary | ICD-10-CM | POA: Diagnosis present

## 2020-08-24 DIAGNOSIS — K529 Noninfective gastroenteritis and colitis, unspecified: Secondary | ICD-10-CM | POA: Diagnosis not present

## 2020-08-24 DIAGNOSIS — E43 Unspecified severe protein-calorie malnutrition: Secondary | ICD-10-CM | POA: Diagnosis not present

## 2020-08-24 DIAGNOSIS — D62 Acute posthemorrhagic anemia: Secondary | ICD-10-CM | POA: Diagnosis present

## 2020-08-24 DIAGNOSIS — E162 Hypoglycemia, unspecified: Secondary | ICD-10-CM | POA: Diagnosis not present

## 2020-08-24 DIAGNOSIS — I088 Other rheumatic multiple valve diseases: Secondary | ICD-10-CM | POA: Diagnosis not present

## 2020-08-24 DIAGNOSIS — K633 Ulcer of intestine: Secondary | ICD-10-CM | POA: Diagnosis not present

## 2020-08-24 DIAGNOSIS — Z4682 Encounter for fitting and adjustment of non-vascular catheter: Secondary | ICD-10-CM | POA: Diagnosis not present

## 2020-08-24 DIAGNOSIS — E871 Hypo-osmolality and hyponatremia: Secondary | ICD-10-CM | POA: Diagnosis present

## 2020-08-24 DIAGNOSIS — K625 Hemorrhage of anus and rectum: Secondary | ICD-10-CM

## 2020-08-24 DIAGNOSIS — R140 Abdominal distension (gaseous): Secondary | ICD-10-CM | POA: Diagnosis not present

## 2020-08-24 DIAGNOSIS — R791 Abnormal coagulation profile: Secondary | ICD-10-CM | POA: Diagnosis not present

## 2020-08-24 DIAGNOSIS — K921 Melena: Secondary | ICD-10-CM | POA: Diagnosis present

## 2020-08-24 DIAGNOSIS — Z66 Do not resuscitate: Secondary | ICD-10-CM | POA: Diagnosis not present

## 2020-08-24 DIAGNOSIS — R109 Unspecified abdominal pain: Secondary | ICD-10-CM | POA: Diagnosis not present

## 2020-08-24 DIAGNOSIS — I700 Atherosclerosis of aorta: Secondary | ICD-10-CM | POA: Diagnosis not present

## 2020-08-24 DIAGNOSIS — Z8711 Personal history of peptic ulcer disease: Secondary | ICD-10-CM

## 2020-08-24 DIAGNOSIS — M87852 Other osteonecrosis, left femur: Secondary | ICD-10-CM | POA: Diagnosis not present

## 2020-08-24 DIAGNOSIS — K6389 Other specified diseases of intestine: Secondary | ICD-10-CM | POA: Diagnosis not present

## 2020-08-24 DIAGNOSIS — K627 Radiation proctitis: Secondary | ICD-10-CM | POA: Diagnosis present

## 2020-08-24 DIAGNOSIS — K209 Esophagitis, unspecified without bleeding: Secondary | ICD-10-CM | POA: Diagnosis not present

## 2020-08-24 DIAGNOSIS — Z992 Dependence on renal dialysis: Secondary | ICD-10-CM

## 2020-08-24 DIAGNOSIS — R627 Adult failure to thrive: Secondary | ICD-10-CM | POA: Diagnosis present

## 2020-08-24 DIAGNOSIS — K6289 Other specified diseases of anus and rectum: Secondary | ICD-10-CM | POA: Diagnosis not present

## 2020-08-24 DIAGNOSIS — Z8249 Family history of ischemic heart disease and other diseases of the circulatory system: Secondary | ICD-10-CM

## 2020-08-24 DIAGNOSIS — G8929 Other chronic pain: Secondary | ICD-10-CM | POA: Diagnosis present

## 2020-08-24 DIAGNOSIS — Z8673 Personal history of transient ischemic attack (TIA), and cerebral infarction without residual deficits: Secondary | ICD-10-CM

## 2020-08-24 DIAGNOSIS — G4733 Obstructive sleep apnea (adult) (pediatric): Secondary | ICD-10-CM | POA: Diagnosis present

## 2020-08-24 DIAGNOSIS — S82401D Unspecified fracture of shaft of right fibula, subsequent encounter for closed fracture with routine healing: Secondary | ICD-10-CM | POA: Diagnosis not present

## 2020-08-24 DIAGNOSIS — R1110 Vomiting, unspecified: Secondary | ICD-10-CM | POA: Diagnosis present

## 2020-08-24 DIAGNOSIS — J449 Chronic obstructive pulmonary disease, unspecified: Secondary | ICD-10-CM | POA: Diagnosis not present

## 2020-08-24 DIAGNOSIS — N281 Cyst of kidney, acquired: Secondary | ICD-10-CM | POA: Diagnosis not present

## 2020-08-24 DIAGNOSIS — I5022 Chronic systolic (congestive) heart failure: Secondary | ICD-10-CM | POA: Diagnosis not present

## 2020-08-24 DIAGNOSIS — K250 Acute gastric ulcer with hemorrhage: Secondary | ICD-10-CM | POA: Diagnosis not present

## 2020-08-24 DIAGNOSIS — K3189 Other diseases of stomach and duodenum: Secondary | ICD-10-CM | POA: Diagnosis not present

## 2020-08-24 DIAGNOSIS — K449 Diaphragmatic hernia without obstruction or gangrene: Secondary | ICD-10-CM | POA: Diagnosis present

## 2020-08-24 DIAGNOSIS — R739 Hyperglycemia, unspecified: Secondary | ICD-10-CM | POA: Diagnosis present

## 2020-08-24 DIAGNOSIS — I482 Chronic atrial fibrillation, unspecified: Secondary | ICD-10-CM | POA: Diagnosis not present

## 2020-08-24 DIAGNOSIS — K5730 Diverticulosis of large intestine without perforation or abscess without bleeding: Secondary | ICD-10-CM | POA: Diagnosis not present

## 2020-08-24 DIAGNOSIS — I959 Hypotension, unspecified: Secondary | ICD-10-CM | POA: Diagnosis not present

## 2020-08-24 DIAGNOSIS — I0981 Rheumatic heart failure: Secondary | ICD-10-CM | POA: Diagnosis not present

## 2020-08-24 DIAGNOSIS — K5791 Diverticulosis of intestine, part unspecified, without perforation or abscess with bleeding: Secondary | ICD-10-CM | POA: Diagnosis not present

## 2020-08-24 DIAGNOSIS — K56699 Other intestinal obstruction unspecified as to partial versus complete obstruction: Secondary | ICD-10-CM | POA: Diagnosis not present

## 2020-08-24 DIAGNOSIS — M1030 Gout due to renal impairment, unspecified site: Secondary | ICD-10-CM | POA: Diagnosis not present

## 2020-08-24 DIAGNOSIS — I129 Hypertensive chronic kidney disease with stage 1 through stage 4 chronic kidney disease, or unspecified chronic kidney disease: Secondary | ICD-10-CM | POA: Diagnosis not present

## 2020-08-24 DIAGNOSIS — N250 Renal osteodystrophy: Secondary | ICD-10-CM | POA: Diagnosis not present

## 2020-08-24 DIAGNOSIS — Z9221 Personal history of antineoplastic chemotherapy: Secondary | ICD-10-CM

## 2020-08-24 LAB — PROTIME-INR
INR: 8.8 (ref 0.8–1.2)
Prothrombin Time: 70.2 seconds — ABNORMAL HIGH (ref 11.4–15.2)

## 2020-08-24 LAB — COMPREHENSIVE METABOLIC PANEL
ALT: 9 U/L (ref 0–44)
AST: 15 U/L (ref 15–41)
Albumin: 3.3 g/dL — ABNORMAL LOW (ref 3.5–5.0)
Alkaline Phosphatase: 99 U/L (ref 38–126)
Anion gap: 17 — ABNORMAL HIGH (ref 5–15)
BUN: 45 mg/dL — ABNORMAL HIGH (ref 8–23)
CO2: 25 mmol/L (ref 22–32)
Calcium: 7.9 mg/dL — ABNORMAL LOW (ref 8.9–10.3)
Chloride: 91 mmol/L — ABNORMAL LOW (ref 98–111)
Creatinine, Ser: 9.08 mg/dL — ABNORMAL HIGH (ref 0.61–1.24)
GFR, Estimated: 6 mL/min — ABNORMAL LOW (ref >60)
Glucose, Bld: 87 mg/dL (ref 70–99)
Potassium: 4.7 mmol/L (ref 3.5–5.1)
Sodium: 133 mmol/L — ABNORMAL LOW (ref 135–145)
Total Bilirubin: 0.7 mg/dL (ref 0.3–1.2)
Total Protein: 7.1 g/dL (ref 6.5–8.1)

## 2020-08-24 LAB — APTT: aPTT: 71 seconds — ABNORMAL HIGH (ref 24–36)

## 2020-08-24 LAB — CBC
HCT: 27.9 % — ABNORMAL LOW (ref 39.0–52.0)
Hemoglobin: 8.9 g/dL — ABNORMAL LOW (ref 13.0–17.0)
MCH: 29.6 pg (ref 26.0–34.0)
MCHC: 31.9 g/dL (ref 30.0–36.0)
MCV: 92.7 fL (ref 80.0–100.0)
Platelets: 501 10*3/uL — ABNORMAL HIGH (ref 150–400)
RBC: 3.01 MIL/uL — ABNORMAL LOW (ref 4.22–5.81)
RDW: 17.3 % — ABNORMAL HIGH (ref 11.5–15.5)
WBC: 17.4 10*3/uL — ABNORMAL HIGH (ref 4.0–10.5)
nRBC: 0 % (ref 0.0–0.2)

## 2020-08-24 LAB — TYPE AND SCREEN
ABO/RH(D): AB POS
Antibody Screen: NEGATIVE

## 2020-08-24 LAB — RESP PANEL BY RT-PCR (FLU A&B, COVID) ARPGX2
Influenza A by PCR: NEGATIVE
Influenza B by PCR: NEGATIVE
SARS Coronavirus 2 by RT PCR: NEGATIVE

## 2020-08-24 LAB — HEMOGLOBIN AND HEMATOCRIT, BLOOD
HCT: 23.8 % — ABNORMAL LOW (ref 39.0–52.0)
Hemoglobin: 7.6 g/dL — ABNORMAL LOW (ref 13.0–17.0)

## 2020-08-24 MED ORDER — LIDOCAINE-PRILOCAINE 2.5-2.5 % EX CREA: 1.0000 "application " | TOPICAL_CREAM | CUTANEOUS | Status: DC | PRN

## 2020-08-24 MED ORDER — PHYTONADIONE 5 MG PO TABS
2.5000 mg | ORAL_TABLET | Freq: Once | ORAL | Status: AC
  Administered 2020-08-24: 2.5 mg via ORAL
  Filled 2020-08-24: qty 1

## 2020-08-24 MED ORDER — ONDANSETRON HCL 4 MG/2ML IJ SOLN
4.0000 mg | Freq: Four times a day (QID) | INTRAMUSCULAR | Status: DC | PRN
  Administered 2020-08-24 – 2020-08-27 (×4): 4 mg via INTRAVENOUS
  Filled 2020-08-24 (×5): qty 2

## 2020-08-24 MED ORDER — HYOSCYAMINE SULFATE 0.125 MG SL SUBL
0.1250 mg | SUBLINGUAL_TABLET | Freq: Four times a day (QID) | SUBLINGUAL | Status: DC | PRN
  Administered 2020-08-24 – 2020-09-13 (×18): 0.125 mg via SUBLINGUAL
  Filled 2020-08-24 (×26): qty 1

## 2020-08-24 MED ORDER — ACETAMINOPHEN 325 MG PO TABS
650.0000 mg | ORAL_TABLET | Freq: Once | ORAL | Status: AC
  Administered 2020-08-24: 650 mg via ORAL
  Filled 2020-08-24 (×2): qty 2

## 2020-08-24 MED ORDER — ACETAMINOPHEN 650 MG RE SUPP: 650.0000 mg | Freq: Four times a day (QID) | RECTAL | Status: DC | PRN

## 2020-08-24 MED ORDER — SODIUM CHLORIDE 0.9 % IV SOLN: Freq: Once | INTRAVENOUS | Status: AC

## 2020-08-24 MED ORDER — HEPARIN SODIUM (PORCINE) 1000 UNIT/ML DIALYSIS: 1000.0000 [IU] | INTRAMUSCULAR | Status: DC | PRN

## 2020-08-24 MED ORDER — VANCOMYCIN 50 MG/ML ORAL SOLUTION
125.0000 mg | Freq: Four times a day (QID) | ORAL | Status: DC
  Administered 2020-08-25 (×2): 125 mg via ORAL
  Filled 2020-08-24 (×8): qty 2.5

## 2020-08-24 MED ORDER — ONDANSETRON HCL 4 MG/2ML IJ SOLN: 4.0000 mg | Freq: Four times a day (QID) | INTRAMUSCULAR | Status: DC | PRN

## 2020-08-24 MED ORDER — CINACALCET HCL 30 MG PO TABS
60.0000 mg | ORAL_TABLET | Freq: Every day | ORAL | Status: DC
  Administered 2020-08-26 – 2020-08-27 (×2): 60 mg via ORAL
  Filled 2020-08-24 (×3): qty 2

## 2020-08-24 MED ORDER — ACETAMINOPHEN 325 MG PO TABS
650.0000 mg | ORAL_TABLET | Freq: Four times a day (QID) | ORAL | Status: DC | PRN
  Administered 2020-08-25 – 2020-09-15 (×28): 650 mg via ORAL
  Filled 2020-08-24 (×27): qty 2

## 2020-08-24 MED ORDER — SODIUM CHLORIDE 0.9 % IV SOLN: 100.0000 mL | INTRAVENOUS | Status: DC | PRN

## 2020-08-24 MED ORDER — PANTOPRAZOLE SODIUM 40 MG IV SOLR
40.0000 mg | Freq: Once | INTRAVENOUS | Status: AC
  Administered 2020-08-24: 40 mg via INTRAVENOUS
  Filled 2020-08-24: qty 40

## 2020-08-24 MED ORDER — SODIUM CHLORIDE 0.9 % IV BOLUS
500.0000 mL | Freq: Once | INTRAVENOUS | Status: AC
  Administered 2020-08-24: 500 mL via INTRAVENOUS

## 2020-08-24 MED ORDER — VITAMIN K1 10 MG/ML IJ SOLN
5.0000 mg | INTRAVENOUS | Status: AC
  Administered 2020-08-24: 5 mg via INTRAVENOUS
  Filled 2020-08-24: qty 0.5

## 2020-08-24 MED ORDER — HYOSCYAMINE SULFATE 0.125 MG/ML PO SOLN: 0.2500 mg | Freq: Four times a day (QID) | ORAL | Status: DC | PRN

## 2020-08-24 MED ORDER — LIDOCAINE HCL (PF) 1 % IJ SOLN: 5.0000 mL | INTRAMUSCULAR | Status: DC | PRN

## 2020-08-24 MED ORDER — ALTEPLASE 2 MG IJ SOLR: 2.0000 mg | Freq: Once | INTRAMUSCULAR | Status: DC | PRN

## 2020-08-24 MED ORDER — IOHEXOL 9 MG/ML PO SOLN
ORAL | Status: AC
  Filled 2020-08-24: qty 500

## 2020-08-24 MED ORDER — FENTANYL CITRATE (PF) 100 MCG/2ML IJ SOLN
12.5000 ug | INTRAMUSCULAR | Status: DC | PRN
  Administered 2020-08-24 – 2020-08-27 (×6): 12.5 ug via INTRAVENOUS
  Filled 2020-08-24 (×6): qty 2

## 2020-08-24 MED ORDER — PENTAFLUOROPROP-TETRAFLUOROETH EX AERO: 1.0000 "application " | INHALATION_SPRAY | CUTANEOUS | Status: DC | PRN

## 2020-08-24 MED ORDER — ONDANSETRON HCL 4 MG PO TABS
4.0000 mg | ORAL_TABLET | Freq: Four times a day (QID) | ORAL | Status: DC | PRN
  Administered 2020-08-27 (×2): 4 mg via ORAL
  Filled 2020-08-24 (×2): qty 1

## 2020-08-24 MED ORDER — SACCHAROMYCES BOULARDII 250 MG PO CAPS
250.0000 mg | ORAL_CAPSULE | Freq: Two times a day (BID) | ORAL | Status: DC
  Administered 2020-08-24 – 2020-09-04 (×20): 250 mg via ORAL
  Filled 2020-08-24 (×23): qty 1

## 2020-08-24 MED ORDER — CHLORHEXIDINE GLUCONATE CLOTH 2 % EX PADS
6.0000 | MEDICATED_PAD | Freq: Every day | CUTANEOUS | Status: DC
  Administered 2020-08-25 – 2020-09-02 (×6): 6 via TOPICAL

## 2020-08-24 MED ORDER — PANTOPRAZOLE SODIUM 40 MG IV SOLR
40.0000 mg | INTRAVENOUS | Status: DC
  Administered 2020-08-25 – 2020-08-28 (×4): 40 mg via INTRAVENOUS
  Filled 2020-08-24 (×4): qty 40

## 2020-08-24 NOTE — Telephone Encounter (Addendum)
Jason [name], this is a Dr. Loletha Carrow patient who recently had a GI bleed on 08/15/20. C. Diff came back positive today and he is having increased rectal bleeding. Wife will be taking him to [facility] ED for evaluation. Thanks   Spoke with patient's wife in regards to positive C. Diff results, she is aware that this infection is very contagious and patient will need to have an isolated bathroom and it will need to be cleaned with Clorox after every use. While discussing recommendations she states that she is getting ready to take patient to [facility] ED for evaluation as he is having heavy rectal bleeding again, she states that it is flowing out of patient's rectum. Wife is aware that patient will need treatment for C. Diff and has been advised that she needs to make the staff aware so that they can implement the appropriate precautions. Advised patient's wife to give me a call back once she has taken the patient to the ED for further recommendations, advised that I will notify our hospital team that patient is coming in. Patient is scheduled for a follow up with Dr.Danis on Wednesday, 09/05/20 at 8:20 AM.

## 2020-08-24 NOTE — H&P (Signed)
History and Physical    Jason [name] Sr. OJJ:[id number] DOB: January 08, 1946 DOA: 08/24/2020  PCP: [name], MD  Patient coming from: Home  Chief Complaint: abdominal pain, N/V/D, rectal bleeding  HPI: Jason WITTWER Sr. is a 75 y.o. male with medical history significant of a fib on coumadin, PUD, ESRD on HD. Presenting with abdominal pain, N/V/D and rectal bleeding. All of his symptoms started last week. He noticed some scant blood in his stool but not a large amount. He thought it was because he was having diarrhea and wiping more. He noticed that any time he ate, he was in the bathroom having a BM shortly after. His diarrhea did not respond to OTC meds. As this progressed he began having LLQ ab pain and N/V. During a follow up with his GI doc d/t having a recent duodenal ulcer bleed; he mentioned his symptoms and labs were obtained. It was noted that he had c diff. He was called in a prescription to tackle this problem. However, this afternoon when he had a BM, he noticed a large amount of blood in his bed and in the toilet. His wife became concerned and they came to the ED.     ED Course: Pt was found to have an elevated INR. He was given oral vit K, started on protonix. [facility] was consulted. [facility] was called for admission.   Review of Systems:  Denies CP, palpitations, dyspnea, syncopal episodes. Reports N/V/D, rectal bleeding. Review of systems is otherwise negative for all not mentioned in HPI.   PMHx Past Medical History:  Diagnosis Date  . Anemia   . Arthritis   . Atrial fibrillation ([place])    on Coumadin   . Chronic renal insufficiency 07/31/2011  . COPD (chronic obstructive pulmonary disease) ([place])    pt denies  . CVA (cerebral vascular accident) ([place]) 06/2018  . Deficiency anemia 07/20/2012  . Diverticulosis   . Edema 09/21/2010   Qualifier: Diagnosis of  By: Jerold Coombe    . ERECTILE DYSFUNCTION, MILD 05/11/2007   Qualifier: Diagnosis of  By: Sherren Mocha MD, Jory Ee   .  Exertional dyspnea 10/30/2015  . GOUT 05/11/2007   Qualifier: Diagnosis of  By: Sherren Mocha MD, Jory Ee   . Hereditary and idiopathic peripheral neuropathy 09/14/2014  . History of radiation therapy 02/21/13-03/31/13   rectum 50.4Gy total dose  . Hypersomnia 10/30/2015  . Hypertension   . Lung nodule 10/30/2015  . OAB (overactive bladder) 01/09/2016  . Obesity (BMI 30.0-34.9) 10/30/2015  . Obstructive sleep apnea 07/29/2010   HST 11/2015 AHI 54.  On autocpap  >> 10 cm   . Peripheral edema 09/19/2013  . rectal ca dx'd 02/01/13   rectal. Radiation and chemotherapy- remains on chemotherapy-next tx. 08-22-13.   . S/P partial lobectomy of lung 11/20/2015  . S/P thoracotomy   . Sleep apnea    wears cpap   . SUI (stress urinary incontinence), male 01/09/2016    PSHx Past Surgical History:  Procedure Laterality Date  . A/V FISTULAGRAM Left 06/14/2018   Procedure: A/V FISTULAGRAM;  Surgeon: Waynetta Sandy, MD;  Location: [place] [facility];  Service: Cardiovascular;  Laterality: Left;  . AV FISTULA PLACEMENT Left 03/24/2016   Procedure: CREATION OF LEFT BRACIOCEPHALIC ARTERIOVENOUS (AV) FISTULA;  Surgeon: Elam Dutch, MD;  Location: [place] [facility];  Service: Vascular;  Laterality: Left;  . BIOPSY  09/17/2018   Procedure: BIOPSY;  Surgeon: Doran Stabler, MD;  Location: [facility];  Service: Gastroenterology;;  . BIOPSY  03/09/2019   Procedure: BIOPSY;  Surgeon: Doran Stabler, MD;  Location: [facility];  Service: Gastroenterology;;  . BIOPSY  11/22/2019   Procedure: BIOPSY;  Surgeon: Doran Stabler, MD;  Location: [facility];  Service: Gastroenterology;;  . COLON SURGERY  05/20/2013  . COLONOSCOPY  2010   [facility]  . COLONOSCOPY WITH PROPOFOL N/A 09/17/2018   Procedure: COLONOSCOPY WITH PROPOFOL;  Surgeon: Doran Stabler, MD;  [facility]: [facility];  Service: Gastroenterology;  Laterality: N/A;  . COLONOSCOPY WITH PROPOFOL N/A 11/22/2019   Procedure: COLONOSCOPY WITH PROPOFOL;   Surgeon: Doran Stabler, MD;  [facility];  Service: Gastroenterology;  Laterality: N/A;  Hx of colon polyps tt   . ESOPHAGOGASTRODUODENOSCOPY (EGD) WITH PROPOFOL N/A 09/17/2018   Procedure: ESOPHAGOGASTRODUODENOSCOPY (EGD) WITH PROPOFOL;  Surgeon: Doran Stabler, MD;  [facility];  Service: Gastroenterology;  Laterality: N/A;  . ESOPHAGOGASTRODUODENOSCOPY (EGD) WITH PROPOFOL N/A 03/09/2019   Procedure: ESOPHAGOGASTRODUODENOSCOPY (EGD) WITH PROPOFOL;  Surgeon: Doran Stabler, MD;  [facility];  Service: Gastroenterology;  Laterality: N/A;  . ESOPHAGOGASTRODUODENOSCOPY (EGD) WITH PROPOFOL N/A 08/15/2020   Procedure: ESOPHAGOGASTRODUODENOSCOPY (EGD) WITH PROPOFOL;  Surgeon: Yetta Flock, MD;  Location: [place];  Service: Gastroenterology;  Laterality: N/A;  . EUS N/A 02/03/2013   Procedure: LOWER ENDOSCOPIC ULTRASOUND (EUS);  Surgeon: Milus Banister, MD;  Location: [facility];  Service: Endoscopy;  Laterality: N/A;  . HEMOSTASIS CLIP PLACEMENT  11/22/2019   Procedure: HEMOSTASIS CLIP PLACEMENT;  Surgeon: Doran Stabler, MD;  [facility];  Service: Gastroenterology;;  . HEMOSTASIS CLIP PLACEMENT  08/15/2020   Procedure: HEMOSTASIS CLIP PLACEMENT;  Surgeon: Yetta Flock, MD;  Location: [facility];  Service: Gastroenterology;;  . HEMOSTASIS CONTROL  08/15/2020   Procedure: HEMOSTASIS CONTROL;  Surgeon: Yetta Flock, MD;  Location: [facility];  Service: Gastroenterology;;  . Lucianne Lei CLOSURE N/A 08/24/2013   Procedure: CLOSURE OF LOOP ILEOSTOMY ;  Surgeon: Leighton Ruff, MD;  Location: [facility];  Service: General;  Laterality: N/A;  . IR ANGIO VERTEBRAL SEL SUBCLAVIAN INNOMINATE BILAT MOD SED  06/08/2018  . IR CT HEAD LTD  06/08/2018  . IR INTRAVSC STENT CERV CAROTID W/O EMB-PROT MOD SED INC ANGIO  06/08/2018  . IR PERCUTANEOUS ART THROMBECTOMY/INFUSION INTRACRANIAL INC DIAG ANGIO  06/08/2018  . LAPAROSCOPIC LOW ANTERIOR  RESECTION N/A 05/20/2013   Procedure: LAPAROSCOPIC LOW ANTERIOR RESECTION WITH SPLENIC FLEXURE MOBILIZATION;  Surgeon: Leighton Ruff, MD;  Location: [facility];  Service: General;  Laterality: N/A;  . OSTOMY N/A 05/20/2013   Procedure: diverting OSTOMY;  Surgeon: Leighton Ruff, MD;  Location: [facility];  Service: General;  Laterality: N/A;  . PERIPHERAL VASCULAR INTERVENTION Left 06/14/2018   Procedure: PERIPHERAL VASCULAR INTERVENTION;  Surgeon: Waynetta Sandy, MD;  Location: [place] [facility];  Service: Cardiovascular;  Laterality: Left;  AVF on the Left  . POLYPECTOMY    . POLYPECTOMY  11/22/2019   Procedure: POLYPECTOMY;  Surgeon: Doran Stabler, MD;  Location: [facility];  Service: Gastroenterology;;  . RADIOLOGY WITH ANESTHESIA N/A 06/08/2018   Procedure: IR WITH ANESTHESIA;  Surgeon: Radiologist, Medication, MD;  Location: [place];  Service: Radiology;  Laterality: N/A;  . REVISON OF ARTERIOVENOUS FISTULA Left 49/70/2637   Procedure: PLICATION/REVISON OF LEFT BRACHIOCEPHALIC ARTERIOVENOUS FISTULA;  Surgeon: Elam Dutch, MD;  Location: [place];  Service: Vascular;  Laterality: Left;  . UPPER GASTROINTESTINAL ENDOSCOPY    .  VIDEO ASSISTED THORACOSCOPY (VATS)/WEDGE RESECTION Left 11/20/2015   Procedure: VIDEO ASSISTED THORACOSCOPY (VATS)/LUNG RESECTION;  Surgeon: [name], MD;  Location: [facility];  Service: Thoracic;  Laterality: Left;  Marland Kitchen VIDEO BRONCHOSCOPY N/A 11/20/2015   Procedure: VIDEO BRONCHOSCOPY;  Surgeon: [name], MD;  Location: [facility];  Service: Thoracic;  Laterality: N/A;    SocHx  reports that he quit smoking about 43 years ago. His smoking use included cigarettes. He has a 5.00 pack-year smoking history. He has never used smokeless tobacco. He reports previous alcohol use. He reports that he does not use drugs.  Allergies  Allergen Reactions  . Nsaids Other (See Comments)    Asked by surgeon to add this medication class as intolerance due to patients  renal insufficiency.  . Lactase Diarrhea  . Lactose Intolerance (Gi) Diarrhea  . Phentermine Other (See Comments)    Unknown reaction  . Amlodipine Other (See Comments)    Edema     FamHx Family History  Problem Relation Age of Onset  . Stroke Mother   . Hypertension Mother   . Stroke Father   . Hypertension Father   . Heart disease Father   . CVA Father   . Hypertension Other   . Colon cancer Neg Hx   . Colon polyps Neg Hx   . Esophageal cancer Neg Hx   . Rectal cancer Neg Hx   . Stomach cancer Neg Hx     Prior to Admission medications   Medication Sig Start Date End Date Taking? Authorizing Provider  acetaminophen (TYLENOL) 325 MG tablet Take 650 mg by mouth every 4 (four) hours as needed for mild pain or moderate pain.  07/03/18   [provider]  atorvastatin (LIPITOR) 20 MG tablet Take 1 tablet (20 mg total) by mouth every evening. 06/12/20   [name], MD  cinacalcet (SENSIPAR) 30 MG tablet Take by mouth. 08/16/20   [provider]  cinacalcet (SENSIPAR) 60 MG tablet Take 60 mg by mouth daily.    [provider]  diphenoxylate-atropine (LOMOTIL) 2.5-0.025 MG tablet Take 1 tablet by mouth 4 (four) times daily. 07/09/20   Doran Stabler, MD  FOSRENOL 1000 MG PACK Take 1,000 mg by mouth 3 (three) times daily with meals.  12/15/19   [provider]  gabapentin (NEURONTIN) 100 MG capsule TAKE 1 CAPSULE BY MOUTH THREE TIMES DAILY Patient taking differently: Take 100 mg by mouth 3 (three) times daily. 06/18/20   [name], MD  lipase/protease/amylase (CREON) 36000 UNITS CPEP capsule Take 2 capsules (72,000 Units total) by mouth 3 (three) times daily with meals. May also take 1 capsule (36,000 Units total) as needed (with snacks). 12/01/19   Doran Stabler, MD  ondansetron (ZOFRAN) 4 MG tablet TAKE ONE TABLET BY MOUTH EVERY 8 HOURS AS NEEDED 08/03/20   Doran Stabler, MD  oxyCODONE (OXY IR/ROXICODONE) 5 MG immediate release  tablet Take 1 tablet (5 mg total) by mouth every 6 (six) hours as needed for moderate pain. 08/13/20   [name], MD  pantoprazole (PROTONIX) 40 MG tablet Take 1 tablet (40 mg total) by mouth 2 (two) times daily. 08/19/20   Geradine Girt, DO  polycarbophil (FIBERCON) 625 MG tablet Take 625 mg by mouth as needed for diarrhea or loose stools.    Doran Stabler, MD  warfarin (COUMADIN) 5 MG tablet Take 5 mg on Sun, Tue, Wed, Thurs, and Sat. Take 2.5 mg  on Mon and Fri. 08/22/20   Geradine Girt, DO    Physical Exam: Vitals:   08/24/20 1438 08/24/20 1600  BP: (!) 122/57 129/60  Pulse: 90 92  Resp: 17 18  Temp: 98.5 F (36.9 C)   TempSrc: Oral   SpO2: 95% 100%    General: 75 y.o. male resting in bed in NAD Eyes: PERRL, normal sclera ENMT: Nares patent w/o discharge, orophaynx clear, dentition normal, ears w/o discharge/lesions/ulcers Neck: Supple, trachea midline Cardiovascular: RRR, +S1, S2, no m/g/r, equal pulses throughout Respiratory: CTABL, no w/r/r, normal WOB GI: BS+,diffuse TTP, , no masses noted, no organomegaly noted MSK: No c/c; BLE 1+ edema Skin: No rashes, bruises, ulcerations noted Neuro: A&O x 3, no focal deficits Psyc: Appropriate interaction and affect, calm/cooperative  Labs on Admission: I have personally reviewed following labs and imaging studies  CBC: Recent Labs  Lab 08/18/20 0423 08/19/20 0322 08/20/20 1517 08/23/20 1459 08/24/20 1511  WBC 12.9* 13.3* 13.8* 13.1* 17.4*  NEUTROABS  --   --  10.7* 10.3*  --   HGB 7.5* 7.7* 8.5 Repeated and verified X2.* 8.9* 8.9*  HCT 23.0* 23.2* 26.3 Repeated and verified X2.* 26.5* 27.9*  MCV 88.8 89.6 90.2 89.1 92.7  PLT 211 202 280.0 447.0* 924*   Basic Metabolic Panel: Recent Labs  Lab 08/18/20 0423 08/24/20 1511  NA 137 133*  K 4.0 4.7  CL 100 91*  CO2 25 25  GLUCOSE 96 87  BUN 60* 45*  CREATININE 7.11* 9.08*  CALCIUM 8.4* 7.9*   GFR: Estimated Creatinine Clearance: 5.9 mL/min (A) (by C-G  formula based on SCr of 9.08 mg/dL (H)). Liver Function Tests: Recent Labs  Lab 08/18/20 0423 08/24/20 1511  AST 16 15  ALT 9 9  ALKPHOS 71 99  BILITOT 0.9 0.7  PROT 5.1* 7.1  ALBUMIN 2.3* 3.3*   No results for input(s): LIPASE, AMYLASE in the last 168 hours. No results for input(s): AMMONIA in the last 168 hours. Coagulation Profile: Recent Labs  Lab 08/20/20 1517  INR 2.1*   Cardiac Enzymes: No results for input(s): CKTOTAL, CKMB, CKMBINDEX, TROPONINI in the last 168 hours. BNP (last 3 results) No results for input(s): PROBNP in the last 8760 hours. HbA1C: No results for input(s): HGBA1C in the last 72 hours. CBG: No results for input(s): GLUCAP in the last 168 hours. Lipid Profile: No results for input(s): CHOL, HDL, LDLCALC, TRIG, CHOLHDL, LDLDIRECT in the last 72 hours. Thyroid Function Tests: No results for input(s): TSH, T4TOTAL, FREET4, T3FREE, THYROIDAB in the last 72 hours. Anemia Panel: No results for input(s): VITAMINB12, FOLATE, FERRITIN, TIBC, IRON, RETICCTPCT in the last 72 hours. Urine analysis:    Component Value Date/Time   COLORURINE RED (A) 08/17/2020 1234   APPEARANCEUR HAZY (A) 08/17/2020 1234   LABSPEC 1.012 08/17/2020 1234   PHURINE 8.0 08/17/2020 1234   GLUCOSEU NEGATIVE 08/17/2020 1234   GLUCOSEU NEGATIVE 10/14/2016 1144   HGBUR LARGE (A) 08/17/2020 1234   HGBUR negative 05/29/2010 0952   BILIRUBINUR NEGATIVE 08/17/2020 1234   BILIRUBINUR Negative 01/16/2020 1608   KETONESUR 5 (A) 08/17/2020 1234   PROTEINUR 100 (A) 08/17/2020 1234   UROBILINOGEN 0.2 01/16/2020 1608   UROBILINOGEN 0.2 10/14/2016 1144   NITRITE NEGATIVE 08/17/2020 1234   LEUKOCYTESUR SMALL (A) 08/17/2020 1234    Radiological Exams on Admission: No results found.  Assessment/Plan C diff colitis Abdominal pain     - admit to inpt, tele @ [facility]     - vancomycin,  florastor     - contact precautions     - check CT ab/pelvis  GIB Supratherapeutic INR Normocytic  anemia     - [facility] onboard, appreciate assistance     - recent hospitalization with GIB secondary to duodenal ulcer; injected/clippped 08/15/20     - PPI     - reverse and hold coumadin; got 2.5 oral Vit K in ED; add 5 IV and rpt INR 12 hours after administration     - q6h H&H; his Hgb is stable at this point     - check CT ab/pelvis     - transfuse for Hgb < 7  ESRD on HD     - admit to [facility] for HD, neprho aware  Hx of rectal cancer s/p LAR w/ diverting loop ileostomy     - given abdominal pain, check CT ab/pelvis  HLD     - statin  A fib     - holding coumadin d/t supratherapeutic INR  DVT prophylaxis: SCD  Code Status: FULL  Family Communication: With wife at bedside  Consults called: [facility], Nephrology  Status is: Inpatient  Remains inpatient appropriate because:Inpatient level of care appropriate due to severity of illness   Dispo: The patient is from: Home              Anticipated d/c is to: Home              Anticipated d/c date is: > 3 days              Patient currently is not medically stable to d/c.   Difficult to place patient No  Jonnie Finner DO [facility]  If 7PM-7AM, please contact night-coverage [email or web address]  08/24/2020, 4:20 PM

## 2020-08-24 NOTE — Telephone Encounter (Signed)
Patients wife called to advise the patient is bleeding a lot again from his rectum. She is requesting to speak with Dr. Loletha Carrow and stated it is an emergency.

## 2020-08-24 NOTE — ED Triage Notes (Signed)
Patient reports rectal bleeding with BM x2 months. Reports more blood today than usual.   States his wife is his legal guardian who brought him in today.

## 2020-08-24 NOTE — Telephone Encounter (Signed)
I am the Doctor of the Day for our practice. I reviewed stool results on behalf of Dr. Loletha Carrow showing C Diff. Given his age and comorbidities, I recommend Dificid 200 mg BID for 10 days.   In addition I recommend that he: (1) Drink a lot of liquids that have water, salt, and sugar. Good choices are water mixed with juice, flavored soda, and soup broth. If you are drinking enough, your urine will be light yellow or almost clear. (2) Try to eat a little food. Good choices are potatoes, noodles, rice, oatmeal, crackers, bananas, soup, and boiled vegetables. (3) Avoid high fat foods, as they can make diarrhea worse.  (4) Dairy products (except yogurt) may be difficult to digest when you have diarrhea. I recommend that you temporarily avoid lactose-containing foods.   Please schedule office follow-up with Dr. Loletha Carrow or an APP in 2 weeks. He should call us with any questions or concerns prior to that time. Thank you.

## 2020-08-24 NOTE — ED Provider Notes (Signed)
[facility] DEPT Provider Note   CSN: [id number] Arrival date & time: 08/24/20  1431     History Chief Complaint  Patient presents with  . Rectal Bleeding    Jason BURGERT Sr. is a 75 y.o. male history of atrial fibrillation on Coumadin, COPD, CVA, gout, hypertension, obesity, OSA, CHF.  Patient presents to the ER today for rectal bleeding and abdominal pain which she reports has been going on for several months.  It is worsened over the past few days he reports lower abdominal pain which is cramping in nature moderate intensity worsened with diarrhea improves with rest pain does not radiate.  It is associated with bright red blood in his stool.  He reports that he is on Coumadin.  He denies fever/chills, chest pain/shortness of breath, cough, dysuria/hematuria, nausea/vomiting or any additional concerns HPI     Past Medical History:  Diagnosis Date  . Anemia   . Arthritis   . Atrial fibrillation ([place])    on Coumadin   . Chronic renal insufficiency 07/31/2011  . COPD (chronic obstructive pulmonary disease) ([place])    pt denies  . CVA (cerebral vascular accident) ([place]) 06/2018  . Deficiency anemia 07/20/2012  . Diverticulosis   . Edema 09/21/2010   Qualifier: Diagnosis of  By: Jerold Coombe    . ERECTILE DYSFUNCTION, MILD 05/11/2007   Qualifier: Diagnosis of  By: Sherren Mocha MD, Jory Ee   . Exertional dyspnea 10/30/2015  . GOUT 05/11/2007   Qualifier: Diagnosis of  By: Sherren Mocha MD, Jory Ee   . Hereditary and idiopathic peripheral neuropathy 09/14/2014  . History of radiation therapy 02/21/13-03/31/13   rectum 50.4Gy total dose  . Hypersomnia 10/30/2015  . Hypertension   . Lung nodule 10/30/2015  . OAB (overactive bladder) 01/09/2016  . Obesity (BMI 30.0-34.9) 10/30/2015  . Obstructive sleep apnea 07/29/2010   HST 11/2015 AHI 54.  On autocpap  >> 10 cm   . Peripheral edema 09/19/2013  . rectal ca dx'd 02/01/13   rectal. Radiation and chemotherapy- remains on  chemotherapy-next tx. 08-22-13.   . S/P partial lobectomy of lung 11/20/2015  . S/P thoracotomy   . Sleep apnea    wears cpap   . SUI (stress urinary incontinence), male 01/09/2016    Patient Active Problem List   Diagnosis Date Noted  . GIB (gastrointestinal bleeding) 08/24/2020  . GI bleed 08/15/2020  . Acute blood loss anemia 08/15/2020  . Supratherapeutic INR 08/15/2020  . Fibula fracture 08/15/2020  . Melena   . Acute gastric ulcer with hemorrhage   . Leg edema, right 08/13/2020  . Injury of right leg 08/13/2020  . Closed nondisplaced segmental fracture of shaft of right fibula 08/13/2020  . Cellulitis of right leg 08/06/2020  . Complication of vascular dialysis catheter 04/18/2020  . Other disorders of phosphorus metabolism 04/04/2020  . Allergy, unspecified, initial encounter 03/01/2020  . Anaphylactic shock, unspecified, initial encounter 03/01/2020  . Fever 01/16/2020  . Benign prostatic hyperplasia without lower urinary tract symptoms 12/12/2019  . Meralgia paraesthetica, right 08/12/2019  . 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 Gait disturbance, post-stroke 04/17/2019  . Iron deficiency anemia, unspecified 03/14/2019  . Chronic obstructive pulmonary disease, unspecified ([place]) 01/25/2019  . Coagulopathy ([place]) 01/25/2019  . Diarrhea, unspecified 01/25/2019  . Hypertensive chronic kidney disease with stage 5 chronic kidney disease or end stage renal disease ([place]) 01/25/2019  . Diverticulosis of intestine, part unspecified, without perforation or abscess without bleeding 01/25/2019  .  Other idiopathic peripheral autonomic neuropathy 01/25/2019  . Hypokalemia 01/25/2019  . Male erectile dysfunction, unspecified 01/25/2019  . Pain, unspecified 01/25/2019  . Primary generalized (osteo)arthritis 01/25/2019  . Pruritus, unspecified 01/25/2019  . Secondary hyperparathyroidism of renal origin ([place]) 01/25/2019  . Anticoagulated 12/31/2018  . Paroxysmal  atrial fibrillation ([place]) 12/22/2018  . Longstanding persistent atrial fibrillation ([place]) 09/20/2018  . Functional diarrhea 07/14/2018  . Benign essential HTN   . ESRD on dialysis ([place]) 06/16/2018  . Mild malnutrition ([place]) 06/16/2018  . Stroke due to embolism ([place]) 06/16/2018  . Chronic HFrEF (heart failure with reduced ejection fraction) ([place])   . CVA (cerebral vascular accident) ([place]) 06/08/2018  . CHF (congestive heart failure) ([place]) 05/08/2018  . Hyperlipidemia LDL goal <70 09/23/2017  . OAB (overactive bladder) 01/09/2016  . SUI (stress urinary incontinence), male 01/09/2016  . Hypersomnia 10/30/2015  . Obesity (BMI 30.0-34.9) 10/30/2015  . Routine general medical examination at a health care facility 06/04/2015  . Medicare annual wellness visit, subsequent 06/04/2015  . Hereditary and idiopathic peripheral neuropathy 09/14/2014  . Rectal cancer ([place]) 02/01/2013  . Obstructive sleep apnea 07/29/2010  . GOUT 05/11/2007  . ERECTILE DYSFUNCTION, MILD 05/11/2007  . Essential hypertension 05/11/2007    Past Surgical History:  Procedure Laterality Date  . A/V FISTULAGRAM Left 06/14/2018   Procedure: A/V FISTULAGRAM;  Surgeon: Waynetta Sandy, MD;  Location: [facility];  Service: Cardiovascular;  Laterality: Left;  . AV FISTULA PLACEMENT Left 03/24/2016   Procedure: CREATION OF LEFT BRACIOCEPHALIC ARTERIOVENOUS (AV) FISTULA;  Surgeon: Elam Dutch, MD;  Location: [facility];  Service: Vascular;  Laterality: Left;  . BIOPSY  09/17/2018   Procedure: BIOPSY;  Surgeon: Doran Stabler, MD;  Location: [facility];  Service: Gastroenterology;;  . BIOPSY  03/09/2019   Procedure: BIOPSY;  Surgeon: Doran Stabler, MD;  Location: [facility];  Service: Gastroenterology;;  . BIOPSY  11/22/2019   Procedure: BIOPSY;  Surgeon: Doran Stabler, MD;  Location: [facility];  Service: Gastroenterology;;  . COLON SURGERY  05/20/2013  . COLONOSCOPY  2010   [facility]  .  COLONOSCOPY WITH PROPOFOL N/A 09/17/2018   Procedure: COLONOSCOPY WITH PROPOFOL;  Surgeon: Doran Stabler, MD;  [facility]: [facility];  Service: Gastroenterology;  Laterality: N/A;  . COLONOSCOPY WITH PROPOFOL N/A 11/22/2019   Procedure: COLONOSCOPY WITH PROPOFOL;  Surgeon: Doran Stabler, MD;  [facility];  Service: Gastroenterology;  Laterality: N/A;  Hx of colon polyps tt   . ESOPHAGOGASTRODUODENOSCOPY (EGD) WITH PROPOFOL N/A 09/17/2018   Procedure: ESOPHAGOGASTRODUODENOSCOPY (EGD) WITH PROPOFOL;  Surgeon: Doran Stabler, MD;  [facility];  Service: Gastroenterology;  Laterality: N/A;  . ESOPHAGOGASTRODUODENOSCOPY (EGD) WITH PROPOFOL N/A 03/09/2019   Procedure: ESOPHAGOGASTRODUODENOSCOPY (EGD) WITH PROPOFOL;  Surgeon: Doran Stabler, MD;  [facility];  Service: Gastroenterology;  Laterality: N/A;  . ESOPHAGOGASTRODUODENOSCOPY (EGD) WITH PROPOFOL N/A 08/15/2020   Procedure: ESOPHAGOGASTRODUODENOSCOPY (EGD) WITH PROPOFOL;  Surgeon: Yetta Flock, MD;  Location: [facility];  Service: Gastroenterology;  Laterality: N/A;  . EUS N/A 02/03/2013   Procedure: LOWER ENDOSCOPIC ULTRASOUND (EUS);  Surgeon: Milus Banister, MD;  Location: [facility];  Service: Endoscopy;  Laterality: N/A;  . HEMOSTASIS CLIP PLACEMENT  11/22/2019   Procedure: HEMOSTASIS CLIP PLACEMENT;  Surgeon: Doran Stabler, MD;  [facility];  Service: Gastroenterology;;  . HEMOSTASIS CLIP PLACEMENT  08/15/2020   Procedure: HEMOSTASIS CLIP PLACEMENT;  Surgeon: Yetta Flock,  MD;  Location: [facility];  Service: Gastroenterology;;  . HEMOSTASIS CONTROL  08/15/2020   Procedure: HEMOSTASIS CONTROL;  Surgeon: Yetta Flock, MD;  Location: [facility];  Service: Gastroenterology;;  . Lucianne Lei CLOSURE N/A 08/24/2013   Procedure: CLOSURE OF LOOP ILEOSTOMY ;  Surgeon: Leighton Ruff, MD;  [facility];  Service: General;  Laterality: N/A;  . IR ANGIO VERTEBRAL SEL  SUBCLAVIAN INNOMINATE BILAT MOD SED  06/08/2018  . IR CT HEAD LTD  06/08/2018  . IR INTRAVSC STENT CERV CAROTID W/O EMB-PROT MOD SED INC ANGIO  06/08/2018  . IR PERCUTANEOUS ART THROMBECTOMY/INFUSION INTRACRANIAL INC DIAG ANGIO  06/08/2018  . LAPAROSCOPIC LOW ANTERIOR RESECTION N/A 05/20/2013   Procedure: LAPAROSCOPIC LOW ANTERIOR RESECTION WITH SPLENIC FLEXURE MOBILIZATION;  Surgeon: Leighton Ruff, MD;  [facility];  Service: General;  Laterality: N/A;  . OSTOMY N/A 05/20/2013   Procedure: diverting OSTOMY;  Surgeon: Leighton Ruff, MD;  [facility];  Service: General;  Laterality: N/A;  . PERIPHERAL VASCULAR INTERVENTION Left 06/14/2018   Procedure: PERIPHERAL VASCULAR INTERVENTION;  Surgeon: Waynetta Sandy, MD;  Location: [facility];  Service: Cardiovascular;  Laterality: Left;  AVF on the Left  . POLYPECTOMY    . POLYPECTOMY  11/22/2019   Procedure: POLYPECTOMY;  Surgeon: Doran Stabler, MD;  Location: [facility];  Service: Gastroenterology;;  . RADIOLOGY WITH ANESTHESIA N/A 06/08/2018   Procedure: IR WITH ANESTHESIA;  Surgeon: Radiologist, Medication, MD;  Location: [facility];  Service: Radiology;  Laterality: N/A;  . REVISON OF ARTERIOVENOUS FISTULA Left 22/29/7989   Procedure: PLICATION/REVISON OF LEFT BRACHIOCEPHALIC ARTERIOVENOUS FISTULA;  Surgeon: Elam Dutch, MD;  Location: [facility];  Service: Vascular;  Laterality: Left;  . UPPER GASTROINTESTINAL ENDOSCOPY    . VIDEO ASSISTED THORACOSCOPY (VATS)/WEDGE RESECTION Left 11/20/2015   Procedure: VIDEO ASSISTED THORACOSCOPY (VATS)/LUNG RESECTION;  Surgeon: [name], MD;  Location: [facility];  Service: Thoracic;  Laterality: Left;  Marland Kitchen VIDEO BRONCHOSCOPY N/A 11/20/2015   Procedure: VIDEO BRONCHOSCOPY;  Surgeon: [name], MD;  [facility]: [facility];  Service: Thoracic;  Laterality: N/A;       Family History  Problem Relation Age of Onset  . Stroke Mother   . Hypertension Mother   . Stroke Father   .  Hypertension Father   . Heart disease Father   . CVA Father   . Hypertension Other   . Colon cancer Neg Hx   . Colon polyps Neg Hx   . Esophageal cancer Neg Hx   . Rectal cancer Neg Hx   . Stomach cancer Neg Hx     Social History   Tobacco Use  . Smoking status: Former Smoker    Packs/day: 0.50    Years: 10.00    Pack years: 5.00    Types: Cigarettes    Quit date: 07/07/1977    Years since quitting: 43.1  . Smokeless tobacco: Never Used  Vaping Use  . Vaping Use: Never used  Substance Use Topics  . Alcohol use: Not Currently    Alcohol/week: 0.0 standard drinks  . Drug use: No    Home Medications Prior to Admission medications   Medication Sig Start Date End Date Taking? Authorizing Provider  lipase/protease/amylase (CREON) 36000 UNITS CPEP capsule Take 2 capsules (72,000 Units total) by mouth 3 (three) times daily with meals. May also take 1 capsule (36,000 Units total) as needed (with snacks). 12/01/19  Yes Doran Stabler, MD  warfarin (COUMADIN) 5 MG tablet  Take 5 mg on Sun, Tue, Wed, Thurs, and Sat. Take 2.5 mg on Mon and Fri. 08/22/20  Yes Vann, Tomi Bamberger, DO  acetaminophen (TYLENOL) 325 MG tablet Take 650 mg by mouth every 4 (four) hours as needed for mild pain or moderate pain.  07/03/18   [provider]  atorvastatin (LIPITOR) 20 MG tablet Take 1 tablet (20 mg total) by mouth every evening. 06/12/20   [name], MD  cinacalcet (SENSIPAR) 30 MG tablet Take by mouth. 08/16/20   [provider]  cinacalcet (SENSIPAR) 60 MG tablet Take 60 mg by mouth daily.    [provider]  diphenoxylate-atropine (LOMOTIL) 2.5-0.025 MG tablet Take 1 tablet by mouth 4 (four) times daily. 07/09/20   Doran Stabler, MD  FOSRENOL 1000 MG PACK Take 1,000 mg by mouth 3 (three) times daily with meals.  12/15/19   [provider]  gabapentin (NEURONTIN) 100 MG capsule TAKE 1 CAPSULE BY MOUTH THREE TIMES DAILY Patient taking differently: Take 100 mg by  mouth 3 (three) times daily. 06/18/20   [name], MD  ondansetron (ZOFRAN) 4 MG tablet TAKE ONE TABLET BY MOUTH EVERY 8 HOURS AS NEEDED 08/03/20   Doran Stabler, MD  oxyCODONE (OXY IR/ROXICODONE) 5 MG immediate release tablet Take 1 tablet (5 mg total) by mouth every 6 (six) hours as needed for moderate pain. 08/13/20   [name], MD  pantoprazole (PROTONIX) 40 MG tablet Take 1 tablet (40 mg total) by mouth 2 (two) times daily. 08/19/20   Geradine Girt, DO  polycarbophil (FIBERCON) 625 MG tablet Take 625 mg by mouth as needed for diarrhea or loose stools.    Doran Stabler, MD    Allergies    Nsaids, Lactase, Lactose intolerance (gi), Phentermine, and Amlodipine  Review of Systems   Review of Systems Ten systems are reviewed and are negative for acute change except as noted in the HPI Physical Exam Updated Vital Signs BP 129/60   Pulse 92   Temp 98.5 F (36.9 C) (Oral)   Resp 18   SpO2 100%   Physical Exam Constitutional:      General: He is not in acute distress.    Appearance: Normal appearance. He is well-developed. He is not ill-appearing or diaphoretic.  HENT:     Head: Normocephalic and atraumatic.  Eyes:     General: Vision grossly intact. Gaze aligned appropriately.     Pupils: Pupils are equal, round, and reactive to light.  Neck:     Trachea: Trachea and phonation normal.  Pulmonary:     Effort: Pulmonary effort is normal. No respiratory distress.  Abdominal:     General: There is no distension.     Palpations: Abdomen is soft.     Tenderness: There is abdominal tenderness (Mild lower abdominal tenderness). There is no guarding or rebound.  Genitourinary:    Comments: Rectal exam deferred, performed by GI APP at bedside who reports of bright red blood. Musculoskeletal:        General: Normal range of motion.     Cervical back: Normal range of motion.  Skin:    General: Skin is warm and dry.  Neurological:     Mental Status: He is alert.      GCS: GCS eye subscore is 4. GCS verbal subscore is 5. GCS motor subscore is 6.     Comments: Speech is clear and goal oriented, follows commands Major Cranial nerves without  deficit, no facial droop Moves extremities without ataxia, coordination intact  Psychiatric:        Behavior: Behavior normal.     ED Results / Procedures / Treatments   Labs (all labs ordered are listed, but only abnormal results are displayed) Labs Reviewed  COMPREHENSIVE METABOLIC PANEL - Abnormal; Notable for the following components:      Result Value   Sodium 133 (*)    Chloride 91 (*)    BUN 45 (*)    Creatinine, Ser 9.08 (*)    Calcium 7.9 (*)    Albumin 3.3 (*)    GFR, Estimated 6 (*)    Anion gap 17 (*)    All other components within normal limits  CBC - Abnormal; Notable for the following components:   WBC 17.4 (*)    RBC 3.01 (*)    Hemoglobin 8.9 (*)    HCT 27.9 (*)    RDW 17.3 (*)    Platelets 501 (*)    All other components within normal limits  RESP PANEL BY RT-PCR (FLU A&B, COVID) ARPGX2  PROTIME-INR  APTT  POC OCCULT BLOOD, ED  TYPE AND SCREEN    EKG None  Radiology No results found.  Procedures Procedures   Medications Ordered in ED Medications  pantoprazole (PROTONIX) injection 40 mg (40 mg Intravenous Given 08/24/20 1610)  sodium chloride 0.9 % bolus 500 mL (500 mLs Intravenous New Bag/Given 08/24/20 1608)  phytonadione (VITAMIN K) tablet 2.5 mg (2.5 mg Oral Given 08/24/20 1611)    ED Course  I have reviewed the triage vital signs and the nursing notes.  Pertinent labs & imaging results that were available during my care of the patient were reviewed by me and considered in my medical decision making (see chart for details).    MDM Rules/Calculators/A&P                         Additional history obtained from: 1. Nursing notes from this visit. 2. Review of electronic medical records.  Reviewed gastroenterology notes from today, patient was sent to the ER for recent  positive C. difficile test as well as increased rectal bleeding.  Most recent INR from 08/20/2020 was 2.1.  LVEF 12/23/2019 of 50-55%. 3. Family, patient's wife at bedside. Reports patient missed his dialysis session yesterday. -------------------- 75 year old male history as above presented for abdominal cramping, rectal bleeding and C. difficile treatment.  Vital signs are stable he is overall well-appearing on examination some mild lower abdominal tenderness.  Gastroenterology APP Chester Holstein also present, rectal examination deferred. [facility] APP advises there is bright red blood present.  I have asked for medicine admission and advised that they do not need CT at this time as there is low concern for toxic megacolon.  They believe this is likely a diverticular bleed.  Also report patient had a recent gastric ulcer they have asked that I start patient on Protonix.  We will also start patient on vitamin K and recheck INR during this visit. ------------ I ordered, reviewed and interpreted labs which include: CBC shows leukocytosis of 17.4 increased from yesterday, hemoglobin 8.9 same as yesterday. CMP elevated creatinine 9.08, BUN 45 slightly increased from prior, consistent with ESRD.  No emergent electrolyte derangement or LFT elevations.  Gap 17. ---- Patient received IV fluids, Protonix and vitamin K. He was reassessed he is resting comfortably in bed no acute distress his wife is at bedside. They're both agreeable for admission. Consult with  Dr. Marylyn Ishihara and patient was accepted to hospitalist service.  Note: Portions of this report may have been transcribed using voice recognition software. Every effort was made to ensure accuracy; however, inadvertent computerized transcription errors may still be present.  Final Clinical Impression(s) / ED Diagnoses Final diagnoses:  Gastrointestinal hemorrhage, unspecified gastrointestinal hemorrhage type  C. difficile colitis    Rx / DC Orders ED Discharge  Orders    None       Deliah Boston, PA-C 08/24/20 1627    Lennice Sites, DO 08/24/20 1633    Lennice Sites, DO 08/24/20 1634

## 2020-08-24 NOTE — Progress Notes (Signed)
[facility] notified that patient is having 8/10 abd pain with nausea Arthor Captain

## 2020-08-24 NOTE — ED Notes (Signed)
Called report to 5N at [facility] to nurse Verdene Lennert. Called [facility] to transport pt.

## 2020-08-24 NOTE — Telephone Encounter (Signed)
Jason [name], Please call the patient for additional information. Recent labs show C diff. Thank you.

## 2020-08-24 NOTE — ED Provider Notes (Signed)
I personally evaluated the patient during the encounter and completed a history, physical, procedures, medical decision making to contribute to the overall care of the patient and decision making for the patient briefly, the patient is a 75 y.o. male here with rectal bleeding.  Bright red blood from rectum.  Recent upper GI bleed.  Is on Coumadin.  INR several days ago was 2.1.  Diagnosed with C. difficile recently as well.  Suspect now lower GI bleed.  Hemodynamically stable.  No major abdominal tenderness.  GI at the bedside upon my evaluation.  They also agree likely GI bleed but lower this time.  We will hold off on any CT imaging at this time as lower concern for toxic megacolon.  Likely diverticular bleed.  Will give a dose of vitamin K and check INR.  Will admit to medicine.  This chart was dictated using voice recognition software.  Despite best efforts to proofread,  errors can occur which can change the documentation meaning.     EKG Interpretation None           Lennice Sites, DO 08/24/20 1553

## 2020-08-24 NOTE — Telephone Encounter (Signed)
[name] called requesting a refill on the following medication : oxyCODONE (OXY IR/ROXICODONE) 5 MG immediate release tablet   [facility] [place], [address] Phone:  [phone]  Fax:  [phone]

## 2020-08-24 NOTE — Telephone Encounter (Signed)
Thanks for notifying me that he is coming to ED

## 2020-08-24 NOTE — Consult Note (Signed)
Referring Provider:  [facility]         Primary Care Physician:  [name], MD Primary Gastroenterologist:  Wilfrid Lund, MD            We were asked to see this patient for:   C-diff and GI bleed             ASSESSMENT / PLAN:   # 75 yo male with C-diff infection in setting of recent antibiotics.  --Vancomycin suspension 250 mg QID --Florastor BID  # Rectal bleeding. Hgb is stable since last admission. Hopefully this is perianal source secondary to worsening diarrhea in setting of C-diff. On exam he has bright red blood on sheet and perianally. On DRE there is fresh red blood with small clot. Diverticular bleed. Ischemic colitis not excluded.    --Getting a dose of Vitamin K --With diarrhea it will be hard for him to retain topical steroids --Monitor H+H and for ongoing bleeding. If bleeding persists will consider CT angiogram.  --Hold coumadin  # Recent upper GI bleed with findings of duodenal ulcer with visible vessel treated with injection and clip on 08/20/20.  --PPI  # Chronic diarrhea, extensive testing has been unrevealing. Followed by Dr. Loletha Carrow. On Creon.   # History of rectal cancer, status post laparoscopic LAR with diverting loop ileostomy 05/23/2013. Final pathology showed a 3.2 cm invasive adenocarcinoma with neoadjuvant related change. Tumor invaded into perirectal soft tissue. There was no lymphovascular or perineural invasion. 14 lymph nodes were negative for tumor. Surgical margins were negative. A 12 mm tubular adenoma without HGD was removed at time of last colonoscopy in May 2021  # ESRD on HD Tu, Thurs, Sat. Will likely transfer to [facility].    HPI:                                                                                                                             Chief Complaint: diarrhea, abdominal pain and rectal bleeding  Jason ROCK Sr. is a 75 y.o. male with multiple medical problems including ESRD, Atrial fibrillation, chronic  diarrhea, CVA, OSA, HTN,  PUD, rectal cancer  08/15/20 Admission. Mr Beauchaine was hospitalized earlier this month with upper abdominal pain, acute on chronic diarrhea, hematemesis x 1 and black stool. He had been taking Ibuprofen. Hgb was down from ~ 11 to 7 and he was shocky. INR was 8.3. He got FFP, Vitamin K and 2 u PRBCs. He underwent EGD with findings of an oozing pylori channel ulcer with a visible vessel s/p epinephrine injection and clip placement. Discharged on 2/13 with hgb of 7.7.  Hgb continued to improve, up to 8.9 yesterday. Told to avoid NSAID, treated with PPI. H.pylori, IgG was negative. He has an office follow up 09/25/20.   Since hospital discharge patient has been having more diarrhea than at baseline. Wife called the office on 2/15 complaining that patient was having black diarrhea, his appetite was poor  and he was nauseated. We checked labs and WBC was 13, hgb 8.9. We were arranging for CT angiogram. In the interim his C-diff returned positive. Nursing called to discuss with wife and she reported that patient wasn't doing well and that he was having rectal bleeding so she brought him to ED.    In ED he is hemodynamically stable. WBC is 17, hgb stable at 8.9, INR 2.1. Patient says that stools had returned to color but returned being black a few days ago. His wife saw a small amount of blood in his stool yesterday and today he had a BM with a large amount of red blood. He complains of intermittent abdominal pain. He is still having nausea but no vomiting or heaving. The mid abdominal pain is different than what he had during last admission when diagnosed with duodenal ulcer. He has not been taking NSAIDS. Says he is compliant with Protonix.    PREVIOUS ENDOSCOPIC EVALUATIONS / PERTINENT STUDIES    08/15/20 EGD for GI bleed 2 cm hiatal hernia. - Normal esophagus otherwise - Oozing ulcer that extends from within the pyloric channel into the proximal duodenal bulb. On the duodenal bulb side  there was active bleeding from a suspected visible vessel. Clip was placed x 1 in good position and area was injected with epinephrine. - Clotted blood in the entire stomach which was lavaged. - Edematous pyloric channel and prepyloric area which limited some views but no obvious lesions there - Normal stomach otherwise - Blood in the duodenal bulb coming from the ulcer, otherwise normal duodenum. Impression: - Return patient to hospital ward for ongoing care. - NPO for tonight, high risk for rebleeding. - Continue present medications. - Continue IV protonix - Trend Hgb post transfusion, monitor for recurrent bleeding - Trend INR, keep < 2 for now - H pylori IgG serology, treat if positive - NO NSAIDS (he has been taking them recently) - GI service will follow, call with questions  May 2021 Colonoscopy for evaluation of diarrhea and removal of polyps not previously removed since patient was on brillinta  Preparation of the colon was fair. - Rectal stricture found on digital rectal exam. - Diverticulosis in the left colon and in the right colon. - Patent end-to-end colo-rectal anastomosis, characterized by healthy appearing mucosa. - One 12 mm polyp in the mid ascending colon, removed piecemeal using a hot snare. Resected and retrieved. Clips (MR conditional) were placed. - Normal mucosa in the entire examined colon. Biopsied. Clips (MR conditional) were placed. - The examination was otherwise normal  FINAL MICROSCOPIC DIAGNOSIS:   A. COLON, ASCENDING, POLYPECTOMY:  - Tubular adenoma(s).  - High grade dysplasia is not identified.   B. COLON, RANDOM, BIOPSY:  - Essentially unremarkable colorectal type mucosa.  - There is no evidence of significant inflammation, dysplasia, or  malignancy.   Sept 2020 EGD for evaluation of duodenal wall thickening on CT scan and diarrhea Normal esophagus. - Normal stomach. - Normal examined duodenum. Biopsied.  Path = peptic  duodenitis   Past Medical History:  Diagnosis Date  . Anemia   . Arthritis   . Atrial fibrillation ([place])    on Coumadin   . Chronic renal insufficiency 07/31/2011  . COPD (chronic obstructive pulmonary disease) ([place])    pt denies  . CVA (cerebral vascular accident) ([place]) 06/2018  . Deficiency anemia 07/20/2012  . Diverticulosis   . Edema 09/21/2010   Qualifier: Diagnosis of  By: Jerold Coombe    . ERECTILE  DYSFUNCTION, MILD 05/11/2007   Qualifier: Diagnosis of  By: Sherren Mocha MD, Jory Ee   . Exertional dyspnea 10/30/2015  . GOUT 05/11/2007   Qualifier: Diagnosis of  By: Sherren Mocha MD, Jory Ee   . Hereditary and idiopathic peripheral neuropathy 09/14/2014  . History of radiation therapy 02/21/13-03/31/13   rectum 50.4Gy total dose  . Hypersomnia 10/30/2015  . Hypertension   . Lung nodule 10/30/2015  . OAB (overactive bladder) 01/09/2016  . Obesity (BMI 30.0-34.9) 10/30/2015  . Obstructive sleep apnea 07/29/2010   HST 11/2015 AHI 54.  On autocpap  >> 10 cm   . Peripheral edema 09/19/2013  . rectal ca dx'd 02/01/13   rectal. Radiation and chemotherapy- remains on chemotherapy-next tx. 08-22-13.   . S/P partial lobectomy of lung 11/20/2015  . S/P thoracotomy   . Sleep apnea    wears cpap   . SUI (stress urinary incontinence), male 01/09/2016    Past Surgical History:  Procedure Laterality Date  . A/V FISTULAGRAM Left 06/14/2018   Procedure: A/V FISTULAGRAM;  Surgeon: Waynetta Sandy, MD;  Location: [facility];  Service: Cardiovascular;  Laterality: Left;  . AV FISTULA PLACEMENT Left 03/24/2016   Procedure: CREATION OF LEFT BRACIOCEPHALIC ARTERIOVENOUS (AV) FISTULA;  Surgeon: Elam Dutch, MD;  Location: [place];  Service: Vascular;  Laterality: Left;  . BIOPSY  09/17/2018   Procedure: BIOPSY;  Surgeon: Doran Stabler, MD;  Location: [facility];  Service: Gastroenterology;;  . BIOPSY  03/09/2019   Procedure: BIOPSY;  Surgeon: Doran Stabler, MD;  Location: [facility];   Service: Gastroenterology;;  . BIOPSY  11/22/2019   Procedure: BIOPSY;  Surgeon: Doran Stabler, MD;  Location: [facility];  Service: Gastroenterology;;  . COLON SURGERY  05/20/2013  . COLONOSCOPY  2010   [facility]  . COLONOSCOPY WITH PROPOFOL N/A 09/17/2018   Procedure: COLONOSCOPY WITH PROPOFOL;  Surgeon: Doran Stabler, MD;  [facility]: [facility];  Service: Gastroenterology;  Laterality: N/A;  . COLONOSCOPY WITH PROPOFOL N/A 11/22/2019   Procedure: COLONOSCOPY WITH PROPOFOL;  Surgeon: Doran Stabler, MD;  [facility];  Service: Gastroenterology;  Laterality: N/A;  Hx of colon polyps tt   . ESOPHAGOGASTRODUODENOSCOPY (EGD) WITH PROPOFOL N/A 09/17/2018   Procedure: ESOPHAGOGASTRODUODENOSCOPY (EGD) WITH PROPOFOL;  Surgeon: Doran Stabler, MD;  [facility];  Service: Gastroenterology;  Laterality: N/A;  . ESOPHAGOGASTRODUODENOSCOPY (EGD) WITH PROPOFOL N/A 03/09/2019   Procedure: ESOPHAGOGASTRODUODENOSCOPY (EGD) WITH PROPOFOL;  Surgeon: Doran Stabler, MD;  [facility];  Service: Gastroenterology;  Laterality: N/A;  . ESOPHAGOGASTRODUODENOSCOPY (EGD) WITH PROPOFOL N/A 08/15/2020   Procedure: ESOPHAGOGASTRODUODENOSCOPY (EGD) WITH PROPOFOL;  Surgeon: Yetta Flock, MD;  Location: [facility];  Service: Gastroenterology;  Laterality: N/A;  . EUS N/A 02/03/2013   Procedure: LOWER ENDOSCOPIC ULTRASOUND (EUS);  Surgeon: Milus Banister, MD;  Location: [facility];  Service: Endoscopy;  Laterality: N/A;  . HEMOSTASIS CLIP PLACEMENT  11/22/2019   Procedure: HEMOSTASIS CLIP PLACEMENT;  Surgeon: Doran Stabler, MD;  Location: [facility];  Service: Gastroenterology;;  . HEMOSTASIS CLIP PLACEMENT  08/15/2020   Procedure: HEMOSTASIS CLIP PLACEMENT;  Surgeon: Yetta Flock, MD;  Location: [employer];  Service: Gastroenterology;;  . HEMOSTASIS CONTROL  08/15/2020   Procedure: HEMOSTASIS CONTROL;  Surgeon: Yetta Flock, MD;  Location: [facility];  Service: Gastroenterology;;  . Lucianne Lei CLOSURE N/A 08/24/2013   Procedure: CLOSURE OF LOOP ILEOSTOMY ;  Surgeon: Leighton Ruff, MD;  [facility];  Service: General;  Laterality: N/A;  . IR ANGIO VERTEBRAL SEL SUBCLAVIAN INNOMINATE BILAT MOD SED  06/08/2018  . IR CT HEAD LTD  06/08/2018  . IR INTRAVSC STENT CERV CAROTID W/O EMB-PROT MOD SED INC ANGIO  06/08/2018  . IR PERCUTANEOUS ART THROMBECTOMY/INFUSION INTRACRANIAL INC DIAG ANGIO  06/08/2018  . LAPAROSCOPIC LOW ANTERIOR RESECTION N/A 05/20/2013   Procedure: LAPAROSCOPIC LOW ANTERIOR RESECTION WITH SPLENIC FLEXURE MOBILIZATION;  Surgeon: Leighton Ruff, MD;  [facility];  Service: General;  Laterality: N/A;  . OSTOMY N/A 05/20/2013   Procedure: diverting OSTOMY;  Surgeon: Leighton Ruff, MD;  Location: [facility];  Service: General;  Laterality: N/A;  . PERIPHERAL VASCULAR INTERVENTION Left 06/14/2018   Procedure: PERIPHERAL VASCULAR INTERVENTION;  Surgeon: Waynetta Sandy, MD;  Location: [facility];  Service: Cardiovascular;  Laterality: Left;  AVF on the Left  . POLYPECTOMY    . POLYPECTOMY  11/22/2019   Procedure: POLYPECTOMY;  Surgeon: Doran Stabler, MD;  Location: [facility];  Service: Gastroenterology;;  . RADIOLOGY WITH ANESTHESIA N/A 06/08/2018   Procedure: IR WITH ANESTHESIA;  Surgeon: Radiologist, Medication, MD;  Location: [facility];  Service: Radiology;  Laterality: N/A;  . REVISON OF ARTERIOVENOUS FISTULA Left 00/17/4944   Procedure: PLICATION/REVISON OF LEFT BRACHIOCEPHALIC ARTERIOVENOUS FISTULA;  Surgeon: Elam Dutch, MD;  Location: [facility];  Service: Vascular;  Laterality: Left;  . UPPER GASTROINTESTINAL ENDOSCOPY    . VIDEO ASSISTED THORACOSCOPY (VATS)/WEDGE RESECTION Left 11/20/2015   Procedure: VIDEO ASSISTED THORACOSCOPY (VATS)/LUNG RESECTION;  Surgeon: [name], MD;  Location: [facility];  Service: Thoracic;  Laterality: Left;  Marland Kitchen VIDEO BRONCHOSCOPY N/A 11/20/2015   Procedure: VIDEO  BRONCHOSCOPY;  Surgeon: [name], MD;  Location: [facility];  Service: Thoracic;  Laterality: N/A;    Prior to Admission medications   Medication Sig Start Date End Date Taking? Authorizing Provider  acetaminophen (TYLENOL) 325 MG tablet Take 650 mg by mouth every 4 (four) hours as needed for mild pain or moderate pain.  07/03/18   [provider]  atorvastatin (LIPITOR) 20 MG tablet Take 1 tablet (20 mg total) by mouth every evening. 06/12/20   [name], MD  cinacalcet (SENSIPAR) 60 MG tablet Take 60 mg by mouth daily.    [provider]  diphenoxylate-atropine (LOMOTIL) 2.5-0.025 MG tablet Take 1 tablet by mouth 4 (four) times daily. 07/09/20   Doran Stabler, MD  FOSRENOL 1000 MG PACK Take 1,000 mg by mouth 3 (three) times daily with meals.  12/15/19   [provider]  gabapentin (NEURONTIN) 100 MG capsule TAKE 1 CAPSULE BY MOUTH THREE TIMES DAILY Patient taking differently: Take 100 mg by mouth 3 (three) times daily. 06/18/20   [name], MD  lipase/protease/amylase (CREON) 36000 UNITS CPEP capsule Take 2 capsules (72,000 Units total) by mouth 3 (three) times daily with meals. May also take 1 capsule (36,000 Units total) as needed (with snacks). 12/01/19   Doran Stabler, MD  ondansetron (ZOFRAN) 4 MG tablet TAKE ONE TABLET BY MOUTH EVERY 8 HOURS AS NEEDED 08/03/20   Doran Stabler, MD  oxyCODONE (OXY IR/ROXICODONE) 5 MG immediate release tablet Take 1 tablet (5 mg total) by mouth every 6 (six) hours as needed for moderate pain. 08/13/20   [name], MD  pantoprazole (PROTONIX) 40 MG tablet Take 1 tablet (40 mg total) by mouth 2 (two) times daily. 08/19/20   Geradine Girt, DO  polycarbophil (FIBERCON) 625 MG tablet Take 625 mg by mouth as needed for diarrhea or loose stools.    Doran Stabler, MD  warfarin (COUMADIN) 5 MG tablet Take 5 mg on Sun, Tue, Wed, Thurs, and Sat. Take 2.5 mg on Mon and Fri. 08/22/20   Geradine Girt, DO    No  current facility-administered medications for this encounter.   Current Outpatient Medications  Medication Sig Dispense Refill  . acetaminophen (TYLENOL) 325 MG tablet Take 650 mg by mouth every 4 (four) hours as needed for mild pain or moderate pain.     Marland Kitchen atorvastatin (LIPITOR) 20 MG tablet Take 1 tablet (20 mg total) by mouth every evening. 90 tablet 1  . cinacalcet (SENSIPAR) 60 MG tablet Take 60 mg by mouth daily.    . diphenoxylate-atropine (LOMOTIL) 2.5-0.025 MG tablet Take 1 tablet by mouth 4 (four) times daily. 120 tablet 4  . FOSRENOL 1000 MG PACK Take 1,000 mg by mouth 3 (three) times daily with meals.     . gabapentin (NEURONTIN) 100 MG capsule TAKE 1 CAPSULE BY MOUTH THREE TIMES DAILY (Patient taking differently: Take 100 mg by mouth 3 (three) times daily.) 90 capsule 5  . lipase/protease/amylase (CREON) 36000 UNITS CPEP capsule Take 2 capsules (72,000 Units total) by mouth 3 (three) times daily with meals. May also take 1 capsule (36,000 Units total) as needed (with snacks). 240 capsule 11  . ondansetron (ZOFRAN) 4 MG tablet TAKE ONE TABLET BY MOUTH EVERY 8 HOURS AS NEEDED 45 tablet 1  . oxyCODONE (OXY IR/ROXICODONE) 5 MG immediate release tablet Take 1 tablet (5 mg total) by mouth every 6 (six) hours as needed for moderate pain. 30 tablet 0  . pantoprazole (PROTONIX) 40 MG tablet Take 1 tablet (40 mg total) by mouth 2 (two) times daily. 60 tablet 1  . polycarbophil (FIBERCON) 625 MG tablet Take 625 mg by mouth as needed for diarrhea or loose stools.    . warfarin (COUMADIN) 5 MG tablet Take 5 mg on Sun, Tue, Wed, Thurs, and Sat. Take 2.5 mg on Mon and Fri.      Allergies as of 08/24/2020 - Review Complete 08/24/2020  Allergen Reaction Noted  . Nsaids Other (See Comments) 05/20/2013  . Lactase Diarrhea 03/30/2019  . Lactose intolerance (gi) Diarrhea 06/17/2018  . Phentermine Other (See Comments) 03/30/2019  . Amlodipine Other (See Comments) 10/02/2015    Family History   Problem Relation Age of Onset  . Stroke Mother   . Hypertension Mother   . Stroke Father   . Hypertension Father   . Heart disease Father   . CVA Father   . Hypertension Other   . Colon cancer Neg Hx   . Colon polyps Neg Hx   . Esophageal cancer Neg Hx   . Rectal cancer Neg Hx   . Stomach cancer Neg Hx     Social History   Socioeconomic History  . Marital status: Married    Spouse name: [name]  . Number of children: 2  . Years of education: 31  . Highest education level: Not on file  Occupational History    Comment: retired Insurance account manager  Tobacco Use  . Smoking status: Former Smoker    Packs/day: 0.50    Years: 10.00    Pack years: 5.00    Types: Cigarettes    Quit date: 07/07/1977    Years since quitting: 43.1  . Smokeless tobacco: Never Used  Vaping Use  . Vaping Use:  Never used  Substance and Sexual Activity  . Alcohol use: Not Currently    Alcohol/week: 0.0 standard drinks  . Drug use: No  . Sexual activity: Not Currently  Other Topics Concern  . Not on file  Social History Narrative   Married to wife, [name]   Retired Insurance account manager w/[employer]   #2 grown children and #6 grandchildren   Social Determinants of Health   Financial Resource Strain: [place]   . Difficulty of Paying Living Expenses: Not hard at all  Food Insecurity: No Food Insecurity  . Worried About Charity fundraiser in the Last Year: Never true  . Ran Out of Food in the Last Year: Never true  Transportation Needs: No Transportation Needs  . Lack of Transportation (Medical): No  . Lack of Transportation (Non-Medical): No  Physical Activity: Sufficiently Active  . Days of Exercise per Week: 5 days  . Minutes of Exercise per Session: 30 min  Stress: No Stress Concern Present  . Feeling of Stress : Not at all  Social Connections: Socially Integrated  . Frequency of Communication with Friends and Family: More than three times a week  . Frequency of Social  Gatherings with Friends and Family: More than three times a week  . Attends Religious Services: More than 4 times per year  . Active Member of Clubs or Organizations: Not on file  . Attends Archivist Meetings: More than 4 times per year  . Marital Status: Married  Human resources officer Violence: Not on file    Review of Systems: All systems reviewed and negative except where noted in HPI.    OBJECTIVE:    Physical Exam: Vital signs in last 24 hours: Temp:  [98.5 F (36.9 C)] 98.5 F (36.9 C) (02/18 1438) Pulse Rate:  [90] 90 (02/18 1438) Resp:  [17] 17 (02/18 1438) BP: (122)/(57) 122/57 (02/18 1438) SpO2:  [95 %] 95 % (02/18 1438)   General:   Alert thin, frail appearing male in NAD Psych:  Pleasant, cooperative. Normal mood and affect. Eyes:  Pupils equal, sclera clear, no icterus.   Conjunctiva pink. Ears:  Normal auditory acuity. Nose:  No deformity, discharge,  or lesions. Neck:  Supple; no masses Lungs:  Clear throughout to auscultation.   No wheezes, crackles, or rhonchi.  Heart:  Regular rate, 2+ RLE edema Abdomen:  Soft, non-distended, nontender, BS active, no palp mass   Rectal:  Deferred  Msk:  Symmetrical without gross deformities. . Neurologic:  Alert and  oriented x4;  grossly normal neurologically. Skin:  Intact without significant lesions or rashes.   Scheduled inpatient medications Intake/Output from previous day: No intake/output data recorded. Intake/Output this shift: No intake/output data recorded.   Lab Results: Recent Labs    08/23/20 1459  WBC 13.1*  HGB 8.9*  HCT 26.5*  PLT 447.0*   BMET No results for input(s): NA, K, CL, CO2, GLUCOSE, BUN, CREATININE, CALCIUM in the last 72 hours. LFT No results for input(s): PROT, ALBUMIN, AST, ALT, ALKPHOS, BILITOT, BILIDIR, IBILI in the last 72 hours. PT/INR No results for input(s): LABPROT, INR in the last 72 hours. Hepatitis Panel No results for input(s): HEPBSAG, HCVAB, HEPAIGM,  HEPBIGM in the last 72 hours.   . CBC Latest Ref Rng & Units 08/23/2020 08/20/2020 08/19/2020  WBC 4.0 - 10.5 K/uL 13.1(H) 13.8(H) 13.3(H)  Hemoglobin 13.0 - 17.0 g/dL 8.9(L) 8.5 Repeated and verified X2.(L) 7.7(L)  Hematocrit 39.0 - 52.0 %  26.5(L) 26.3 Repeated and verified X2.(L) 23.2(L)  Platelets 150.0 - 400.0 K/uL 447.0(H) 280.0 202    . CMP Latest Ref Rng & Units 08/18/2020 08/17/2020 08/16/2020  Glucose 70 - 99 mg/dL 96 60(L) 62(L)  BUN 8 - 23 mg/dL 60(H) 52(H) 104(H)  Creatinine 0.61 - 1.24 mg/dL 7.11(H) 5.63(H) 7.37(H)  Sodium 135 - 145 mmol/L 137 140 140  Potassium 3.5 - 5.1 mmol/L 4.0 3.8 4.8  Chloride 98 - 111 mmol/L 100 97(L) 96(L)  CO2 22 - 32 mmol/L 25 27 22   Calcium 8.9 - 10.3 mg/dL 8.4(L) 9.1 9.5  Total Protein 6.5 - 8.1 g/dL 5.1(L) 5.5(L) -  Total Bilirubin 0.3 - 1.2 mg/dL 0.9 1.3(H) -  Alkaline Phos 38 - 126 U/L 71 72 -  AST 15 - 41 U/L 16 19 -  ALT 0 - 44 U/L 9 11 -   Studies/Results: No results found.  Active Problems:   * No active hospital problems. Tye Savoy, NP-C @  08/24/2020, 3:14 PM

## 2020-08-24 NOTE — ED Notes (Signed)
Family at bedside. 

## 2020-08-25 DIAGNOSIS — A0472 Enterocolitis due to Clostridium difficile, not specified as recurrent: Secondary | ICD-10-CM | POA: Diagnosis not present

## 2020-08-25 DIAGNOSIS — K625 Hemorrhage of anus and rectum: Secondary | ICD-10-CM | POA: Diagnosis not present

## 2020-08-25 DIAGNOSIS — K921 Melena: Secondary | ICD-10-CM | POA: Diagnosis not present

## 2020-08-25 LAB — CBC
HCT: 24.8 % — ABNORMAL LOW (ref 39.0–52.0)
HCT: 24.3 % — ABNORMAL LOW (ref 39.0–52.0)
Hemoglobin: 8.5 g/dL — ABNORMAL LOW (ref 13.0–17.0)
Hemoglobin: 8.1 g/dL — ABNORMAL LOW (ref 13.0–17.0)
MCH: 30.1 pg (ref 26.0–34.0)
MCH: 29.7 pg (ref 26.0–34.0)
MCHC: 34.3 g/dL (ref 30.0–36.0)
MCHC: 33.3 g/dL (ref 30.0–36.0)
MCV: 87.9 fL (ref 80.0–100.0)
MCV: 89 fL (ref 80.0–100.0)
Platelets: 359 10*3/uL (ref 150–400)
Platelets: 342 10*3/uL (ref 150–400)
RBC: 2.82 MIL/uL — ABNORMAL LOW (ref 4.22–5.81)
RBC: 2.73 MIL/uL — ABNORMAL LOW (ref 4.22–5.81)
RDW: 16.9 % — ABNORMAL HIGH (ref 11.5–15.5)
RDW: 17.2 % — ABNORMAL HIGH (ref 11.5–15.5)
WBC: 30.9 10*3/uL — ABNORMAL HIGH (ref 4.0–10.5)
WBC: 31.8 10*3/uL — ABNORMAL HIGH (ref 4.0–10.5)
nRBC: 0 % (ref 0.0–0.2)
nRBC: 0 % (ref 0.0–0.2)

## 2020-08-25 LAB — RENAL FUNCTION PANEL
Albumin: 2.3 g/dL — ABNORMAL LOW (ref 3.5–5.0)
Anion gap: 18 — ABNORMAL HIGH (ref 5–15)
BUN: 55 mg/dL — ABNORMAL HIGH (ref 8–23)
CO2: 19 mmol/L — ABNORMAL LOW (ref 22–32)
Calcium: 7.5 mg/dL — ABNORMAL LOW (ref 8.9–10.3)
Chloride: 96 mmol/L — ABNORMAL LOW (ref 98–111)
Creatinine, Ser: 9.76 mg/dL — ABNORMAL HIGH (ref 0.61–1.24)
GFR, Estimated: 5 mL/min — ABNORMAL LOW (ref >60)
Glucose, Bld: 74 mg/dL (ref 70–99)
Phosphorus: 5.9 mg/dL — ABNORMAL HIGH (ref 2.5–4.6)
Potassium: 5.3 mmol/L — ABNORMAL HIGH (ref 3.5–5.1)
Sodium: 133 mmol/L — ABNORMAL LOW (ref 135–145)

## 2020-08-25 LAB — COMPREHENSIVE METABOLIC PANEL
ALT: 9 U/L (ref 0–44)
AST: 12 U/L — ABNORMAL LOW (ref 15–41)
Albumin: 2.3 g/dL — ABNORMAL LOW (ref 3.5–5.0)
Alkaline Phosphatase: 82 U/L (ref 38–126)
Anion gap: 14 (ref 5–15)
BUN: 52 mg/dL — ABNORMAL HIGH (ref 8–23)
CO2: 22 mmol/L (ref 22–32)
Calcium: 7.2 mg/dL — ABNORMAL LOW (ref 8.9–10.3)
Chloride: 97 mmol/L — ABNORMAL LOW (ref 98–111)
Creatinine, Ser: 9.59 mg/dL — ABNORMAL HIGH (ref 0.61–1.24)
GFR, Estimated: 5 mL/min — ABNORMAL LOW (ref >60)
Glucose, Bld: 76 mg/dL (ref 70–99)
Potassium: 5.5 mmol/L — ABNORMAL HIGH (ref 3.5–5.1)
Sodium: 133 mmol/L — ABNORMAL LOW (ref 135–145)
Total Bilirubin: 0.9 mg/dL (ref 0.3–1.2)
Total Protein: 5.2 g/dL — ABNORMAL LOW (ref 6.5–8.1)

## 2020-08-25 LAB — PREPARE RBC (CROSSMATCH)

## 2020-08-25 LAB — PROTIME-INR
INR: 2.6 — ABNORMAL HIGH (ref 0.8–1.2)
Prothrombin Time: 27.4 seconds — ABNORMAL HIGH (ref 11.4–15.2)

## 2020-08-25 LAB — HEMOGLOBIN AND HEMATOCRIT, BLOOD
HCT: 20.7 % — ABNORMAL LOW (ref 39.0–52.0)
Hemoglobin: 6.8 g/dL — CL (ref 13.0–17.0)

## 2020-08-25 MED ORDER — SODIUM CHLORIDE 0.9% IV SOLUTION: Freq: Once | INTRAVENOUS | Status: AC

## 2020-08-25 MED ORDER — PROCHLORPERAZINE EDISYLATE 10 MG/2ML IJ SOLN
10.0000 mg | Freq: Once | INTRAMUSCULAR | Status: AC
  Administered 2020-08-25: 10 mg via INTRAVENOUS
  Filled 2020-08-25: qty 2

## 2020-08-25 MED ORDER — VITAMIN K1 10 MG/ML IJ SOLN
5.0000 mg | Freq: Once | INTRAVENOUS | Status: AC
  Administered 2020-08-25: 5 mg via INTRAVENOUS
  Filled 2020-08-25: qty 0.5

## 2020-08-25 MED ORDER — SODIUM CHLORIDE 0.9% IV SOLUTION: Freq: Once | INTRAVENOUS | Status: DC

## 2020-08-25 MED ORDER — DARBEPOETIN ALFA 60 MCG/0.3ML IJ SOSY
60.0000 ug | PREFILLED_SYRINGE | INTRAMUSCULAR | Status: DC
  Administered 2020-09-02: 60 ug via INTRAVENOUS
  Filled 2020-08-25 (×2): qty 0.3

## 2020-08-25 NOTE — Progress Notes (Signed)
PROGRESS NOTE    Jason DAGOSTINO Sr.  NID:[id number] DOB: 09-15-1945 DOA: 08/24/2020 PCP: [name], MD    Brief Narrative:  75 year old gentleman with history of chronic A. fib on Coumadin, peptic ulcer disease, ESRD on hemodialysis TTS schedule who presented to the ER with abdominal cramping, nausea vomiting diarrhea and rectal bleeding.  Patient was recently in the hospital 2/9-2/13 with upper GI bleeding and found to have Bleeding duodenal ulcer treated with epinephrine injection and clipping.  Discharged on Protonix twice daily.Patient also has chronic diarrhea.  He has history of rectal cancer with resection.  Admitted with rectal bleeding, C. difficile infection and diarrhea and anemia.  Patient was hemodynamically stable in the ER.  Assessment & Plan:   Active Problems:   GIB (gastrointestinal bleeding)  C. difficile colitis with diarrhea and abdominal pain: First episode.  Stabilizing.  Currently on oral vancomycin, will continue for 10 days. CT scan with evidence of proctitis, probably radiation changes.  Lower GI bleeding, painless rectal bleeding: Anemia of acute blood loss. Suspect outlet bleeding from proctitis/radiation injury with ongoing diarrhea and supratherapeutic INR. Currently hemodynamically stabilizing. Hemoglobin 8.9-7.6--6.8-we will order 1 unit of PRBC today.  Recheck post transfusion hemoglobin.  Recheck every day. Followed by gastroenterology, they will probably treat him conservatively with active C. difficile infection. Will start on clear liquid diet, GI to make further recommendations. On Protonix 40 mg IV once a day.  Likely lower GI bleeding so will keep on once a day Protonix due to concomitant C. difficile infection.  Acquired thrombophilia, chronic A. fib on Coumadin with supratherapeutic INR: Presented with INR of 8.8, on Coumadin and probably supratherapeutic due to acute illnesses. Reversed with IV and oral vitamin K, INR is 2.6.  No evidence  of active bleeding. Continue to hold Coumadin, no need for further reversal. A. fib is rate controlled.  ESRD on hemodialysis: Stable.  Going for dialysis today.  DVT prophylaxis: SCDs Start: 08/24/20 2254   Code Status: Full code Family Communication: Called patient's wife, unable to talk. Disposition Plan: Status is: Inpatient  Remains inpatient appropriate because:Persistent severe electrolyte disturbances and IV treatments appropriate due to intensity of illness or inability to take PO   Dispo: The patient is from: Home              Anticipated d/c is to: Home              Anticipated d/c date is: 2 days              Patient currently is not medically stable to d/c.   Difficult to place patient No         Consultants:   Gastroenterology  Nephrology  Procedures:   Routine dialysis  Antimicrobials:   Vancomycin by mouth 2/18---   Subjective: Patient seen and examined.  No family at the bedside.  Was receiving blood.  Patient himself stated that he has no bowel movement since admission, nursing reported that he had 3 loose bowel movements overnight and they were not bloody.  Denies any nausea or vomiting.  Hungry.   Objective: Vitals:   08/25/20 0538 08/25/20 0614 08/25/20 0845 08/25/20 0955  BP: 132/65 125/60 124/65 126/60  Pulse: 88 78 76 78  Resp: 18 16 18 16   Temp: 98.1 F (36.7 C) 98.1 F (36.7 C) 98.1 F (36.7 C) 98.1 F (36.7 C)  TempSrc: Oral Oral Oral Oral  SpO2: 100% 100% 99%     Intake/Output Summary (Last 24  hours) at 08/25/2020 1130 Last data filed at 08/25/2020 0845 Gross per 24 hour  Intake 913.5 ml  Output --  Net 913.5 ml   There were no vitals filed for this visit.  Examination:  General exam: Chronically sick looking, frail and debilitated gentleman lying in bed, looks comfortable. Respiratory system: Clear to auscultation. Respiratory effort normal.  No added sounds. Cardiovascular system: S1 & S2 heard, RRR.   Gastrointestinal system: Soft and nontender.  Bowel sounds present. Central nervous system: Alert and oriented. No focal neurological deficits.  Generalized weakness. AV fistula with thrill on left upper extremity.     Data Reviewed: I have personally reviewed following labs and imaging studies  CBC: Recent Labs  Lab 08/19/20 0322 08/20/20 1517 08/23/20 1459 08/24/20 1511 08/24/20 2144 08/25/20 0253 08/25/20 1006  WBC 13.3* 13.8* 13.1* 17.4*  --   --  30.9*  NEUTROABS  --  10.7* 10.3*  --   --   --   --   HGB 7.7* 8.5 Repeated and verified X2.* 8.9* 8.9* 7.6* 6.8* 8.5*  HCT 23.2* 26.3 Repeated and verified X2.* 26.5* 27.9* 23.8* 20.7* 24.8*  MCV 89.6 90.2 89.1 92.7  --   --  87.9  PLT 202 280.0 447.0* 501*  --   --  401   Basic Metabolic Panel: Recent Labs  Lab 08/24/20 1511 08/25/20 1006  NA 133* 133*  K 4.7 5.5*  CL 91* 97*  CO2 25 22  GLUCOSE 87 76  BUN 45* 52*  CREATININE 9.08* 9.59*  CALCIUM 7.9* 7.2*   GFR: Estimated Creatinine Clearance: 5.6 mL/min (A) (by C-G formula based on SCr of 9.59 mg/dL (H)). Liver Function Tests: Recent Labs  Lab 08/24/20 1511 08/25/20 1006  AST 15 12*  ALT 9 9  ALKPHOS 99 82  BILITOT 0.7 0.9  PROT 7.1 5.2*  ALBUMIN 3.3* 2.3*   No results for input(s): LIPASE, AMYLASE in the last 168 hours. No results for input(s): AMMONIA in the last 168 hours. Coagulation Profile: Recent Labs  Lab 08/20/20 1517 08/24/20 1536 08/25/20 1006  INR 2.1* 8.8* 2.6*   Cardiac Enzymes: No results for input(s): CKTOTAL, CKMB, CKMBINDEX, TROPONINI in the last 168 hours. BNP (last 3 results) No results for input(s): PROBNP in the last 8760 hours. HbA1C: No results for input(s): HGBA1C in the last 72 hours. CBG: No results for input(s): GLUCAP in the last 168 hours. Lipid Profile: No results for input(s): CHOL, HDL, LDLCALC, TRIG, CHOLHDL, LDLDIRECT in the last 72 hours. Thyroid Function Tests: No results for input(s): TSH, T4TOTAL,  FREET4, T3FREE, THYROIDAB in the last 72 hours. Anemia Panel: No results for input(s): VITAMINB12, FOLATE, FERRITIN, TIBC, IRON, RETICCTPCT in the last 72 hours. Sepsis Labs: No results for input(s): PROCALCITON, LATICACIDVEN in the last 168 hours.  Recent Results (from the past 240 hour(s))  Urine culture     Status: Abnormal   Collection Time: 08/17/20 12:34 PM   Specimen: In/Out Cath Urine  Result Value Ref Range Status   Specimen Description IN/OUT CATH URINE  Final   Special Requests   Final    NONE Performed at [facility], [address], [place], [place] [address]    Culture 4,000 COLONIES/mL ENTEROCOCCUS GALLINARUM (A)  Final   Report Status 08/21/2020 FINAL  Final   Organism ID, Bacteria ENTEROCOCCUS GALLINARUM (A)  Final      Susceptibility   Enterococcus gallinarum - MIC*    AMPICILLIN <=2 SENSITIVE Sensitive  NITROFURANTOIN <=16 SENSITIVE Sensitive     VANCOMYCIN RESISTANT Resistant     LINEZOLID 1 SENSITIVE Sensitive     * 4,000 COLONIES/mL ENTEROCOCCUS GALLINARUM  Resp Panel by RT-PCR (Flu A&B, Covid) Nasopharyngeal Swab     Status: None   Collection Time: 08/24/20  3:40 PM   Specimen: Nasopharyngeal Swab; Nasopharyngeal(NP) swabs in vial transport medium  Result Value Ref Range Status   SARS Coronavirus 2 by RT PCR NEGATIVE NEGATIVE Final    Comment: (NOTE) SARS-CoV-2 target nucleic acids are NOT DETECTED.  The SARS-CoV-2 RNA is generally detectable in upper respiratory specimens during the acute phase of infection. The lowest concentration of SARS-CoV-2 viral copies this assay can detect is 138 copies/mL. A negative result does not preclude SARS-Cov-2 infection and should not be used as the sole basis for treatment or other patient management decisions. A negative result may occur with  improper specimen collection/handling, submission of specimen other than nasopharyngeal swab, presence of viral mutation(s) within the areas targeted by this assay,  and inadequate number of viral copies(<138 copies/mL). A negative result must be combined with clinical observations, patient history, and epidemiological information. The expected result is Negative.  Fact Sheet for Patients:  EntrepreneurPulse.com.au  Fact Sheet for Healthcare Providers:  IncredibleEmployment.be  This test is no t yet approved or cleared by the [place] FDA and  has been authorized for detection and/or diagnosis of SARS-CoV-2 by FDA under an Emergency Use Authorization (EUA). This EUA will remain  in effect (meaning this test can be used) for the duration of the COVID-19 declaration under Section 564(b)(1) of the Act, 21 U.S.C.section 360bbb-3(b)(1), unless the authorization is terminated  or revoked sooner.       Influenza A by PCR NEGATIVE NEGATIVE Final   Influenza B by PCR NEGATIVE NEGATIVE Final    Comment: (NOTE) The Xpert Xpress SARS-CoV-2/FLU/RSV plus assay is intended as an aid in the diagnosis of influenza from Nasopharyngeal swab specimens and should not be used as a sole basis for treatment. Nasal washings and aspirates are unacceptable for Xpert Xpress SARS-CoV-2/FLU/RSV testing.  Fact Sheet for Patients: EntrepreneurPulse.com.au  Fact Sheet for Healthcare Providers: IncredibleEmployment.be  This test is not yet approved or cleared by the [place] FDA and has been authorized for detection and/or diagnosis of SARS-CoV-2 by FDA under an Emergency Use Authorization (EUA). This EUA will remain in effect (meaning this test can be used) for the duration of the COVID-19 declaration under Section 564(b)(1) of the Act, 21 U.S.C. section 360bbb-3(b)(1), unless the authorization is terminated or revoked.  Performed at [facility], [place] [address], [place], [place] [address]          Radiology Studies: CT ABDOMEN PELVIS WO CONTRAST  Result  Date: 08/24/2020 CLINICAL DATA:  Acute abdominal pain EXAM: CT ABDOMEN AND PELVIS WITHOUT CONTRAST TECHNIQUE: Multidetector CT imaging of the abdomen and pelvis was performed following the standard protocol without IV contrast. COMPARISON:  None. FINDINGS: LOWER CHEST: Normal. HEPATOBILIARY: Normal hepatic contours. No intra- or extrahepatic biliary dilatation. The gallbladder is normal. PANCREAS: Normal pancreas. No ductal dilatation or peripancreatic fluid collection. SPLEEN: Normal. ADRENALS/URINARY TRACT: The adrenal glands are normal. Hyperdense right renal cyst measures 0.5 cm. Hyperdense left renal lesion measures 1.3 cm. No hydronephrosis. The urinary bladder is normal for degree of distention STOMACH/BOWEL: There is no hiatal hernia. Normal duodenal course and caliber. No small bowel dilatation or inflammation. There is wall thickening of the sigmoid colon and rectum with surrounding inflammatory  stranding. No free intraperitoneal air or abscess. Normal appendix. VASCULAR/LYMPHATIC: There is calcific atherosclerosis of the abdominal aorta. No lymphadenopathy. REPRODUCTIVE: Normal prostate size with symmetric seminal vesicles. MUSCULOSKELETAL. No bony spinal canal stenosis or focal osseous abnormality. OTHER: None. IMPRESSION: 1. Wall thickening of the sigmoid colon and rectum with surrounding inflammatory stranding, consistent with acute colitis/proctitis. No free intraperitoneal air or abscess. Aortic Atherosclerosis (ICD10-I70.0). Electronically Signed   By: Ulyses Jarred M.D.   On: 08/24/2020 23:37        Scheduled Meds: . sodium chloride   Intravenous Once  . Chlorhexidine Gluconate Cloth  6 each Topical Q0600  . cinacalcet  60 mg Oral Q breakfast  . darbepoetin (ARANESP) injection - DIALYSIS  60 mcg Intravenous Q Sat-HD  . pantoprazole (PROTONIX) IV  40 mg Intravenous Q24H  . saccharomyces boulardii  250 mg Oral BID  . vancomycin  125 mg Oral QID   Continuous Infusions: . sodium  chloride    . sodium chloride       LOS: 1 day    Time spent: 35 minutes    Barb Merino, MD [facility] Pager [phone]

## 2020-08-25 NOTE — Progress Notes (Addendum)
Progress Note  Chief Complaint:   Rectal bleeding and C-diff     ASSESSMENT / PLAN:    # 75 yo male with C-diff infection in setting of recent antibiotics.  --Continue Vancomycin suspension 150 mg QID on day # 2.  --Continue Florastor BID  # Rectal bleeding, ongoing. Likely related to C-diff colitis ( left colitis on CT scan) or diverticular hemorrhage in setting of supratherpeutic INR ( 8 range) INR down to 2.6 this am.   --I contacted Hospitalist. He will additional Vit K and FFP to further lower INR --Supportive care. Will consider CTA if continues to bleed after further improvement in INR.  # Acute on chronic anemia with GI bleed. Hgb declined overnight from mid 8 to 6.8.   He had 3 episodes of rectal bleeding during the night and right now is actively passing red blood. Not hypotensive.  --Received a unit of blood this am. Hgb 6.8 >>> 8.5 but expected to decline again with actively bleeding.  --Monitor H+H  # Recent upper GI bleed with findings of duodenal ulcer with visible vessel treated with injection and clip on 08/20/20.  --Continue PPI  # Chronic diarrhea, extensive testing has been unrevealing. Followed by Dr. Loletha Carrow. On Creon.   # History of rectal cancer, status post laparoscopicLAR withdiverting loop ileostomy 05/23/2013. Final pathology showed a 3.2 cm invasive adenocarcinoma with neoadjuvant related change. Tumor invaded into perirectal soft tissue. There was no lymphovascular or perineural invasion. 14 lymph nodes were negative for tumor. Surgical margins were negative. A 12 mm tubular adenoma without HGD was removed at time of last colonoscopy in May 2021  # ESRD on HD Tu, Thurs, Sat.       Attending Physician Note   I have taken an interval history, reviewed the chart and examined the patient. I agree with the Advanced Practitioner's note, impression and recommendations.   * C diff colitis with rectal and sigmoid inflammatory changes on CT. WBC has  increased to 30.9. Continue vanco 125 mg po qid and Florastor 250 mg po bid. Trend CBC, abdominal exam.  * Hematochezia in setting of coagulopathy very likely due to C diff colitis or diverticulosis. Correct coagulopathy and trend CBC. No plans for addition evaluation at this time.  * Recent DU with bleed, resolved. Continue PPI long term.  Lucio Edward, MD FACG [phone]       SUBJECTIVE:   Somnolent, diarrhea slightly improved    OBJECTIVE:     Scheduled inpatient medications:  . sodium chloride   Intravenous Once  . Chlorhexidine Gluconate Cloth  6 each Topical Q0600  . cinacalcet  60 mg Oral Q breakfast  . darbepoetin (ARANESP) injection - DIALYSIS  60 mcg Intravenous Q Sat-HD  . pantoprazole (PROTONIX) IV  40 mg Intravenous Q24H  . saccharomyces boulardii  250 mg Oral BID  . vancomycin  125 mg Oral QID   Continuous inpatient infusions:  . sodium chloride    . sodium chloride     PRN inpatient medications: sodium chloride, sodium chloride, acetaminophen **OR** acetaminophen, alteplase, fentaNYL (SUBLIMAZE) injection, heparin, hyoscyamine, lidocaine (PF), lidocaine-prilocaine, ondansetron **OR** ondansetron (ZOFRAN) IV, pentafluoroprop-tetrafluoroeth  Vital signs in last 24 hours: Temp:  [98.1 F (36.7 C)-99.1 F (37.3 C)] 98.1 F (36.7 C) (02/19 0955) Pulse Rate:  [76-96] 78 (02/19 0955) Resp:  [16-18] 16 (02/19 0955) BP: (121-161)/(57-82) 126/60 (02/19 0955) SpO2:  [95 %-100 %] 99 % (02/19 0845) Last BM Date: 08/23/20  Intake/Output Summary (Last 24  hours) at 08/25/2020 1242 Last data filed at 08/25/2020 0845 Gross per 24 hour  Intake 913.5 ml  Output --  Net 913.5 ml     Physical Exam:  General: Alert thin male in NAD. Wife in room Heart:  Regular rate and rhythm.  Pulmonary: Normal respiratory effort Abdomen: Soft, nondistended, nontender. Normal bowel sounds Rectal: moderate to large amount of red blood on pad and actively coming from rectum.   Neurologic: Alert and oriented Psych: Cooperative.   There were no vitals filed for this visit.  Intake/Output from previous day: 02/18 0701 - 02/19 0700 In: 500 [IV Piggyback:500] Out: -  Intake/Output this shift: Total I/O In: 413.5 [Blood:413.5] Out: -     Lab Results: Recent Labs    08/23/20 1459 08/24/20 1511 08/24/20 2144 08/25/20 0253 08/25/20 1006  WBC 13.1* 17.4*  --   --  30.9*  HGB 8.9* 8.9* 7.6* 6.8* 8.5*  HCT 26.5* 27.9* 23.8* 20.7* 24.8*  PLT 447.0* 501*  --   --  359   BMET Recent Labs    08/24/20 1511 08/25/20 1006  NA 133* 133*  K 4.7 5.5*  CL 91* 97*  CO2 25 22  GLUCOSE 87 76  BUN 45* 52*  CREATININE 9.08* 9.59*  CALCIUM 7.9* 7.2*   LFT Recent Labs    08/25/20 1006  PROT 5.2*  ALBUMIN 2.3*  AST 12*  ALT 9  ALKPHOS 82  BILITOT 0.9   PT/INR Recent Labs    08/24/20 1536 08/25/20 1006  LABPROT 70.2* 27.4*  INR 8.8* 2.6*   Hepatitis Panel No results for input(s): HEPBSAG, HCVAB, HEPAIGM, HEPBIGM in the last 72 hours.  CT ABDOMEN PELVIS WO CONTRAST  Result Date: 08/24/2020 CLINICAL DATA:  Acute abdominal pain EXAM: CT ABDOMEN AND PELVIS WITHOUT CONTRAST TECHNIQUE: Multidetector CT imaging of the abdomen and pelvis was performed following the standard protocol without IV contrast. COMPARISON:  None. FINDINGS: LOWER CHEST: Normal. HEPATOBILIARY: Normal hepatic contours. No intra- or extrahepatic biliary dilatation. The gallbladder is normal. PANCREAS: Normal pancreas. No ductal dilatation or peripancreatic fluid collection. SPLEEN: Normal. ADRENALS/URINARY TRACT: The adrenal glands are normal. Hyperdense right renal cyst measures 0.5 cm. Hyperdense left renal lesion measures 1.3 cm. No hydronephrosis. The urinary bladder is normal for degree of distention STOMACH/BOWEL: There is no hiatal hernia. Normal duodenal course and caliber. No small bowel dilatation or inflammation. There is wall thickening of the sigmoid colon and rectum with  surrounding inflammatory stranding. No free intraperitoneal air or abscess. Normal appendix. VASCULAR/LYMPHATIC: There is calcific atherosclerosis of the abdominal aorta. No lymphadenopathy. REPRODUCTIVE: Normal prostate size with symmetric seminal vesicles. MUSCULOSKELETAL. No bony spinal canal stenosis or focal osseous abnormality. OTHER: None. IMPRESSION: 1. Wall thickening of the sigmoid colon and rectum with surrounding inflammatory stranding, consistent with acute colitis/proctitis. No free intraperitoneal air or abscess. Aortic Atherosclerosis (ICD10-I70.0). Electronically Signed   By: Ulyses Jarred M.D.   On: 08/24/2020 23:37    Active Problems:   GIB (gastrointestinal bleeding)    LOS: 1 day   Tye Savoy ,NP 08/25/2020, 12:42 PM

## 2020-08-25 NOTE — Progress Notes (Incomplete)
Date and time results received: 08/25/20 *** (use smartphrase ".now" to insert current time)  Test: CBC Critical Value: Hbg 6.8  Name of Provider Notified: Blount NP [facility]  Orders Received yes Or Actions Takenyes  New orders

## 2020-08-25 NOTE — Progress Notes (Signed)
[facility] Progress Note   Subjective:  Recent discharge from [facility] 08/19/20 with ABLA 2/2 GI bleed. GI consulted and underwent EGD showing duodenal ulcer with visible vessel treated with injection/clip on 2/9. Coumadin was resumed.  Readmitted with rectal bleeding/C. Diff colitis.   Seen and examined at bedside. Endorses nausea/abd pain/diarrhea. Hgb down to 6.8. Transfused 1 u prbcs this am.   Missed dialysis Thursday d/t not feeling well. Plan for dialysis today.    Objective Vitals:   08/25/20 0538 08/25/20 0614 08/25/20 0845 08/25/20 0955  BP: 132/65 125/60 124/65 126/60  Pulse: 88 78 76 78  Resp: 18 16 18 16   Temp: 98.1 F (36.7 C) 98.1 F (36.7 C) 98.1 F (36.7 C) 98.1 F (36.7 C)  TempSrc: Oral Oral Oral Oral  SpO2: 100% 100% 99%        Additional Objective Labs: Basic Metabolic Panel: Recent Labs  Lab 08/24/20 1511  NA 133*  K 4.7  CL 91*  CO2 25  GLUCOSE 87  BUN 45*  CREATININE 9.08*  CALCIUM 7.9*   CBC: Recent Labs  Lab 08/19/20 0322 08/20/20 1517 08/23/20 1459 08/24/20 1511 08/24/20 2144 08/25/20 0253  WBC 13.3* 13.8* 13.1* 17.4*  --   --   NEUTROABS  --  10.7* 10.3*  --   --   --   HGB 7.7* 8.5 Repeated and verified X2.* 8.9* 8.9* 7.6* 6.8*  HCT 23.2* 26.3 Repeated and verified X2.* 26.5* 27.9* 23.8* 20.7*  MCV 89.6 90.2 89.1 92.7  --   --   PLT 202 280.0 447.0* 501*  --   --    Blood Culture    Component Value Date/Time   SDES IN/OUT CATH URINE 08/17/2020 1234   SPECREQUEST  08/17/2020 1234    NONE Performed at [facility] [address], [place], [place] [address]    CULT 4,000 COLONIES/mL ENTEROCOCCUS GALLINARUM (A) 08/17/2020 1234   REPTSTATUS 08/21/2020 FINAL 08/17/2020 1234     Physical Exam General: Ill appearing, nad  Heart: RRR  Lungs: Clear bilaterally  Abdomen: soft mild tenderness to palpation  Extremities: No LE edema  Dialysis Access: LUE AVF +bruit   Medications: . sodium chloride    .  sodium chloride     . sodium chloride   Intravenous Once  . Chlorhexidine Gluconate Cloth  6 each Topical Q0600  . cinacalcet  60 mg Oral Q breakfast  . pantoprazole (PROTONIX) IV  40 mg Intravenous Q24H  . saccharomyces boulardii  250 mg Oral BID  . vancomycin  125 mg Oral QID    Dialysis Orders:  [facility] TTS 4h 450/500 58kg 3K/2Ca UFP2 L AVF No heparin  Mircera 50 (to start 2/22)  Venofer 50  Hectorol 7   Assessment/Plan: 1. BRBPR/ Hx GI bleed - s/p EGD 2/9 with duodenal ulcer, treated w/ clip+ injection. On Protonix. Per Primary/GI. Hgb 6.8. For transfusion today.  2. ESRD - HD TTS. Missed Thursday. HD today on schedule.  3. C. Diff colitis. On PO Vanc.  4. AFib- on coumadin. Supratherapeutic INR on admission.  Reverse and hold Coumadin per primary  5. HTNVolume - BP/Volume stable. UF to EDW as tolerated. 6. Anemia CKD -- s/p Aranesp 60 on 2/12 here. Resume ESA  7. MBD CKD --Corr Ca ok. Continue home binders/VDRA  8. COPD  9. H/o prior rectal cancer - in 2014 treated w/ chemoRx, radiation, laparoscopic low anterior resection with diverting loop ileostomy and subsequent ileostomy takedown.    [place]  Larina Earthly PA-C [facility] 08/25/2020,10:00 AM

## 2020-08-26 ENCOUNTER — Other Ambulatory Visit: Payer: Self-pay

## 2020-08-26 DIAGNOSIS — A0472 Enterocolitis due to Clostridium difficile, not specified as recurrent: Secondary | ICD-10-CM | POA: Diagnosis not present

## 2020-08-26 DIAGNOSIS — K921 Melena: Secondary | ICD-10-CM | POA: Diagnosis not present

## 2020-08-26 LAB — CBC WITH DIFFERENTIAL/PLATELET
Abs Immature Granulocytes: 0.69 10*3/uL — ABNORMAL HIGH (ref 0.00–0.07)
Basophils Absolute: 0.1 10*3/uL (ref 0.0–0.1)
Basophils Relative: 0 %
Eosinophils Absolute: 0 10*3/uL (ref 0.0–0.5)
Eosinophils Relative: 0 %
HCT: 24.3 % — ABNORMAL LOW (ref 39.0–52.0)
Hemoglobin: 8.6 g/dL — ABNORMAL LOW (ref 13.0–17.0)
Immature Granulocytes: 2 %
Lymphocytes Relative: 1 %
Lymphs Abs: 0.4 10*3/uL — ABNORMAL LOW (ref 0.7–4.0)
MCH: 30.3 pg (ref 26.0–34.0)
MCHC: 35.4 g/dL (ref 30.0–36.0)
MCV: 85.6 fL (ref 80.0–100.0)
Monocytes Absolute: 1.4 10*3/uL — ABNORMAL HIGH (ref 0.1–1.0)
Monocytes Relative: 4 %
Neutro Abs: 33.2 10*3/uL — ABNORMAL HIGH (ref 1.7–7.7)
Neutrophils Relative %: 93 %
Platelets: 323 10*3/uL (ref 150–400)
RBC: 2.84 MIL/uL — ABNORMAL LOW (ref 4.22–5.81)
RDW: 17.7 % — ABNORMAL HIGH (ref 11.5–15.5)
WBC: 35.7 10*3/uL — ABNORMAL HIGH (ref 4.0–10.5)
nRBC: 0 % (ref 0.0–0.2)

## 2020-08-26 LAB — PREPARE FRESH FROZEN PLASMA

## 2020-08-26 LAB — MAGNESIUM: Magnesium: 1.8 mg/dL (ref 1.7–2.4)

## 2020-08-26 LAB — TYPE AND SCREEN
ABO/RH(D): AB POS
Antibody Screen: NEGATIVE
Unit division: 0
Unit division: 0

## 2020-08-26 LAB — BPAM FFP
Blood Product Expiration Date: 202202242359
Blood Product Expiration Date: 202202242359
ISSUE DATE / TIME: 202202191344
ISSUE DATE / TIME: 202202191633
Unit Type and Rh: 8400
Unit Type and Rh: 8400

## 2020-08-26 LAB — BPAM RBC
Blood Product Expiration Date: 202202192359
Blood Product Expiration Date: 202202192359
ISSUE DATE / TIME: 202202190548
ISSUE DATE / TIME: 202202191849
Unit Type and Rh: 1700
Unit Type and Rh: 1700

## 2020-08-26 LAB — PROTIME-INR
INR: 1.8 — ABNORMAL HIGH (ref 0.8–1.2)
Prothrombin Time: 20.3 seconds — ABNORMAL HIGH (ref 11.4–15.2)

## 2020-08-26 LAB — MRSA PCR SCREENING: MRSA by PCR: NEGATIVE

## 2020-08-26 MED ORDER — VANCOMYCIN 50 MG/ML ORAL SOLUTION
500.0000 mg | Freq: Four times a day (QID) | ORAL | Status: AC
  Administered 2020-08-26 – 2020-09-03 (×32): 500 mg via ORAL
  Filled 2020-08-26 (×39): qty 10

## 2020-08-26 MED ORDER — DARBEPOETIN ALFA 60 MCG/0.3ML IJ SOSY
60.0000 ug | PREFILLED_SYRINGE | Freq: Once | INTRAMUSCULAR | Status: AC
  Administered 2020-08-26: 60 ug via SUBCUTANEOUS
  Filled 2020-08-26: qty 0.3

## 2020-08-26 NOTE — Progress Notes (Signed)
PROGRESS NOTE    Jason CARDENAS Sr.  DEY:[id number] DOB: 09-05-1945 DOA: 08/24/2020 PCP: [name], MD    Brief Narrative:  75 year old gentleman with history of chronic A. fib on Coumadin, peptic ulcer disease, ESRD on hemodialysis TTS schedule who presented to the ER with abdominal cramping, nausea vomiting diarrhea and rectal bleeding.  Patient was recently in the hospital 2/9-2/13 with upper GI bleeding and found to have bleeding duodenal ulcer treated with epinephrine injection and clipping.  Discharged on Protonix twice daily.Patient also has chronic diarrhea.  He has history of rectal cancer with resection.  Admitted with rectal bleeding, C. difficile infection and diarrhea and anemia.  Patient was hemodynamically stable in the ER.  Assessment & Plan:   Active Problems:   Hematochezia   GIB (gastrointestinal bleeding)   C. difficile colitis  C. difficile colitis with diarrhea and abdominal pain: First episode.  CT scan with evidence of proctitis, probably radiation changes. Patient's abdominal pain is improving.  WC count worsening, more than 35,000 today. With worsening WBC count and ongoing diarrhea and bleeding, will escalate dose of oral vancomycin to 500 mg 4 times a day for 10 days.  Will treat as fulminant C. Difficile. Advance diet as tolerated.  Currently tolerating liquid diet.  Lower GI bleeding, painless rectal bleeding: Anemia of acute blood loss. Suspect outlet bleeding from proctitis/radiation injury with ongoing diarrhea and supratherapeutic INR. Currently hemodynamically stabilizing. Supratherapeutic INR, presented with 8.8-aggressively reversed with 2 units of FFP, IV vitamin K. INR is 1.8, would not need anymore reversal. Hemoglobin 8.9-7.6-6.8-1 unit of transfusion-8.1-1 unit of transfusion 8.6. Received total 2 units of PRBC and is stabilizing. Followed by GI, anticipate conservative management with active C. difficile infection. On reduced dose of  Protonix for duodenal ulcer due to concomitant C. difficile.  Acquired thrombophilia, chronic A. fib on Coumadin with supratherapeutic INR: Presented with INR of 8.8, on Coumadin and probably supratherapeutic due to acute illnesses. Aggressively reversed. Continue to hold Coumadin, no need for further reversal. A. fib is rate controlled.  ESRD on hemodialysis: Stable.  Getting dialysis.  Patient is stabilizing.  Start working with therapies.  DVT prophylaxis: SCDs Start: 08/24/20 2254   Code Status: Full code Family Communication: Called patient's wife and son number, they were unable to speak. Disposition Plan: Status is: Inpatient  Remains inpatient appropriate because:Persistent severe electrolyte disturbances and IV treatments appropriate due to intensity of illness or inability to take PO   Dispo: The patient is from: Home              Anticipated d/c is to: Home              Anticipated d/c date is: 2 days              Patient currently is not medically stable to d/c.   Difficult to place patient No         Consultants:   Gastroenterology  Nephrology  Procedures:   Routine dialysis  Antimicrobials:   Vancomycin by mouth 2/18---   Subjective: Patient seen and examined.  Denies any nausea vomiting.  He is tolerating full liquid diet.  Has mild abdominal cramping but denies any severe pain. Had 1 bloody bowel movement overnight.  Early morning, he had just rectal bleeding, cannot quantify but there was no stool. Currently denies any symptoms.  He was eating breakfast.   Objective: Vitals:   08/26/20 0600 08/26/20 0615 08/26/20 0812 08/26/20 0900  BP: (!) 141/67 134/70  Marland Kitchen)  143/71  Pulse: 94 94  93  Resp:  16  15  Temp: 99 F (37.2 C) 98.5 F (36.9 C)  98.2 F (36.8 C)  TempSrc: Oral Oral  Oral  SpO2:  100%  100%  Height:   5\' 8"  (1.727 m)     Intake/Output Summary (Last 24 hours) at 08/26/2020 1138 Last data filed at 08/26/2020 0900 Gross per  24 hour  Intake 1028 ml  Output 2406 ml  Net -1378 ml   There were no vitals filed for this visit.  Examination:  General exam: Chronically sick looking, frail and debilitated gentleman sitting at the edge of the bed and eating.  Not in any distress. Respiratory system: Clear to auscultation. Respiratory effort normal.  No added sounds. Cardiovascular system: S1 & S2 heard, RRR.  Gastrointestinal system: Soft and nontender.  Bowel sounds present. Central nervous system: Alert and oriented. No focal neurological deficits.  Generalized weakness. AV fistula with thrill on left upper extremity.     Data Reviewed: I have personally reviewed following labs and imaging studies  CBC: Recent Labs  Lab 08/20/20 1517 08/23/20 1459 08/24/20 1511 08/24/20 2144 08/25/20 0253 08/25/20 1006 08/25/20 1413 08/26/20 0649  WBC 13.8* 13.1* 17.4*  --   --  30.9* 31.8* 35.7*  NEUTROABS 10.7* 10.3*  --   --   --   --   --  33.2*  HGB 8.5 Repeated and verified X2.* 8.9* 8.9* 7.6* 6.8* 8.5* 8.1* 8.6*  HCT 26.3 Repeated and verified X2.* 26.5* 27.9* 23.8* 20.7* 24.8* 24.3* 24.3*  MCV 90.2 89.1 92.7  --   --  87.9 89.0 85.6  PLT 280.0 447.0* 501*  --   --  359 342 710   Basic Metabolic Panel: Recent Labs  Lab 08/24/20 1511 08/25/20 1006 08/25/20 1413 08/26/20 0649  NA 133* 133* 133*  --   K 4.7 5.5* 5.3*  --   CL 91* 97* 96*  --   CO2 25 22 19*  --   GLUCOSE 87 76 74  --   BUN 45* 52* 55*  --   CREATININE 9.08* 9.59* 9.76*  --   CALCIUM 7.9* 7.2* 7.5*  --   MG  --   --   --  1.8  PHOS  --   --  5.9*  --    GFR: Estimated Creatinine Clearance: 5.5 mL/min (A) (by C-G formula based on SCr of 9.76 mg/dL (H)). Liver Function Tests: Recent Labs  Lab 08/24/20 1511 08/25/20 1006 08/25/20 1413  AST 15 12*  --   ALT 9 9  --   ALKPHOS 99 82  --   BILITOT 0.7 0.9  --   PROT 7.1 5.2*  --   ALBUMIN 3.3* 2.3* 2.3*   No results for input(s): LIPASE, AMYLASE in the last 168 hours. No  results for input(s): AMMONIA in the last 168 hours. Coagulation Profile: Recent Labs  Lab 08/20/20 1517 08/24/20 1536 08/25/20 1006 08/26/20 0649  INR 2.1* 8.8* 2.6* 1.8*   Cardiac Enzymes: No results for input(s): CKTOTAL, CKMB, CKMBINDEX, TROPONINI in the last 168 hours. BNP (last 3 results) No results for input(s): PROBNP in the last 8760 hours. HbA1C: No results for input(s): HGBA1C in the last 72 hours. CBG: No results for input(s): GLUCAP in the last 168 hours. Lipid Profile: No results for input(s): CHOL, HDL, LDLCALC, TRIG, CHOLHDL, LDLDIRECT in the last 72 hours. Thyroid Function Tests: No results for input(s): TSH, T4TOTAL, FREET4, T3FREE, THYROIDAB in  the last 72 hours. Anemia Panel: No results for input(s): VITAMINB12, FOLATE, FERRITIN, TIBC, IRON, RETICCTPCT in the last 72 hours. Sepsis Labs: No results for input(s): PROCALCITON, LATICACIDVEN in the last 168 hours.  Recent Results (from the past 240 hour(s))  Urine culture     Status: Abnormal   Collection Time: 08/17/20 12:34 PM   Specimen: In/Out Cath Urine  Result Value Ref Range Status   Specimen Description IN/OUT CATH URINE  Final   Special Requests   Final    NONE Performed at [facility], [address] [place], [place] [address]    Culture 4,000 COLONIES/mL ENTEROCOCCUS GALLINARUM (A)  Final   Report Status 08/21/2020 FINAL  Final   Organism ID, Bacteria ENTEROCOCCUS GALLINARUM (A)  Final      Susceptibility   Enterococcus gallinarum - MIC*    AMPICILLIN <=2 SENSITIVE Sensitive     NITROFURANTOIN <=16 SENSITIVE Sensitive     VANCOMYCIN RESISTANT Resistant     LINEZOLID 1 SENSITIVE Sensitive     * 4,000 COLONIES/mL ENTEROCOCCUS GALLINARUM  Resp Panel by RT-PCR (Flu A&B, Covid) Nasopharyngeal Swab     Status: None   Collection Time: 08/24/20  3:40 PM   Specimen: Nasopharyngeal Swab; Nasopharyngeal(NP) swabs in vial transport medium  Result Value Ref Range Status   SARS Coronavirus 2 by  RT PCR NEGATIVE NEGATIVE Final    Comment: (NOTE) SARS-CoV-2 target nucleic acids are NOT DETECTED.  The SARS-CoV-2 RNA is generally detectable in upper respiratory specimens during the acute phase of infection. The lowest concentration of SARS-CoV-2 viral copies this assay can detect is 138 copies/mL. A negative result does not preclude SARS-Cov-2 infection and should not be used as the sole basis for treatment or other patient management decisions. A negative result may occur with  improper specimen collection/handling, submission of specimen other than nasopharyngeal swab, presence of viral mutation(s) within the areas targeted by this assay, and inadequate number of viral copies(<138 copies/mL). A negative result must be combined with clinical observations, patient history, and epidemiological information. The expected result is Negative.  Fact Sheet for Patients:  EntrepreneurPulse.com.au  Fact Sheet for Healthcare Providers:  IncredibleEmployment.be  This test is no t yet approved or cleared by the [place] FDA and  has been authorized for detection and/or diagnosis of SARS-CoV-2 by FDA under an Emergency Use Authorization (EUA). This EUA will remain  in effect (meaning this test can be used) for the duration of the COVID-19 declaration under Section 564(b)(1) of the Act, 21 U.S.C.section 360bbb-3(b)(1), unless the authorization is terminated  or revoked sooner.       Influenza A by PCR NEGATIVE NEGATIVE Final   Influenza B by PCR NEGATIVE NEGATIVE Final    Comment: (NOTE) The Xpert Xpress SARS-CoV-2/FLU/RSV plus assay is intended as an aid in the diagnosis of influenza from Nasopharyngeal swab specimens and should not be used as a sole basis for treatment. Nasal washings and aspirates are unacceptable for Xpert Xpress SARS-CoV-2/FLU/RSV testing.  Fact Sheet for Patients: EntrepreneurPulse.com.au  Fact Sheet  for Healthcare Providers: IncredibleEmployment.be  This test is not yet approved or cleared by the [place] FDA and has been authorized for detection and/or diagnosis of SARS-CoV-2 by FDA under an Emergency Use Authorization (EUA). This EUA will remain in effect (meaning this test can be used) for the duration of the COVID-19 declaration under Section 564(b)(1) of the Act, 21 U.S.C. section 360bbb-3(b)(1), unless the authorization is terminated or revoked.  Performed at [facility], [place] [address] [place], [place] [address]   MRSA PCR Screening     Status: None   Collection Time: 08/26/20  8:05 AM   Specimen: Nasal Mucosa; Nasopharyngeal  Result Value Ref Range Status   MRSA by PCR NEGATIVE NEGATIVE Final    Comment:        The GeneXpert MRSA Assay (FDA approved for NASAL specimens only), is one component of a comprehensive MRSA colonization surveillance program. It is not intended to diagnose MRSA infection nor to guide or monitor treatment for MRSA infections. Performed at [facility], [place] [address], [place], [place] [address]          Radiology Studies: CT ABDOMEN PELVIS WO CONTRAST  Result Date: 08/24/2020 CLINICAL DATA:  Acute abdominal pain EXAM: CT ABDOMEN AND PELVIS WITHOUT CONTRAST TECHNIQUE: Multidetector CT imaging of the abdomen and pelvis was performed following the standard protocol without IV contrast. COMPARISON:  None. FINDINGS: LOWER CHEST: Normal. HEPATOBILIARY: Normal hepatic contours. No intra- or extrahepatic biliary dilatation. The gallbladder is normal. PANCREAS: Normal pancreas. No ductal dilatation or peripancreatic fluid collection. SPLEEN: Normal. ADRENALS/URINARY TRACT: The adrenal glands are normal. Hyperdense right renal cyst measures 0.5 cm. Hyperdense left renal lesion measures 1.3 cm. No hydronephrosis. The urinary bladder is normal for degree of distention STOMACH/BOWEL: There is no hiatal  hernia. Normal duodenal course and caliber. No small bowel dilatation or inflammation. There is wall thickening of the sigmoid colon and rectum with surrounding inflammatory stranding. No free intraperitoneal air or abscess. Normal appendix. VASCULAR/LYMPHATIC: There is calcific atherosclerosis of the abdominal aorta. No lymphadenopathy. REPRODUCTIVE: Normal prostate size with symmetric seminal vesicles. MUSCULOSKELETAL. No bony spinal canal stenosis or focal osseous abnormality. OTHER: None. IMPRESSION: 1. Wall thickening of the sigmoid colon and rectum with surrounding inflammatory stranding, consistent with acute colitis/proctitis. No free intraperitoneal air or abscess. Aortic Atherosclerosis (ICD10-I70.0). Electronically Signed   By: Ulyses Jarred M.D.   On: 08/24/2020 23:37        Scheduled Meds: . sodium chloride   Intravenous Once  . Chlorhexidine Gluconate Cloth  6 each Topical Q0600  . cinacalcet  60 mg Oral Q breakfast  . darbepoetin (ARANESP) injection - DIALYSIS  60 mcg Intravenous Q Sat-HD  . darbepoetin (ARANESP) injection - NON-DIALYSIS  60 mcg Subcutaneous Once  . pantoprazole (PROTONIX) IV  40 mg Intravenous Q24H  . saccharomyces boulardii  250 mg Oral BID  . vancomycin  500 mg Oral QID   Continuous Infusions:    LOS: 2 days    Time spent: 34 minutes    Barb Merino, MD [facility] Pager [phone]

## 2020-08-26 NOTE — Progress Notes (Signed)
[name] Progress Note   Subjective:  Completed dialysis early this am. Net UF 1.8L. Tolerated ok  Continued rectal bleeding, abd discomfort   Objective Vitals:   08/26/20 0600 08/26/20 0615 08/26/20 0812 08/26/20 0900  BP: (!) 141/67 134/70  (!) 143/71  Pulse: 94 94  93  Resp:  16  15  Temp: 99 F (37.2 C) 98.5 F (36.9 C)  98.2 F (36.8 C)  TempSrc: Oral Oral  Oral  SpO2:  100%  100%  Height:   5\' 8"  (1.727 m)        Additional Objective Labs: Basic Metabolic Panel: Recent Labs  Lab 08/24/20 1511 08/25/20 1006 08/25/20 1413  NA 133* 133* 133*  K 4.7 5.5* 5.3*  CL 91* 97* 96*  CO2 25 22 19*  GLUCOSE 87 76 74  BUN 45* 52* 55*  CREATININE 9.08* 9.59* 9.76*  CALCIUM 7.9* 7.2* 7.5*  PHOS  --   --  5.9*   CBC: Recent Labs  Lab 08/20/20 1517 08/23/20 1459 08/24/20 1511 08/24/20 2144 08/25/20 1006 08/25/20 1413 08/26/20 0649  WBC 13.8* 13.1* 17.4*  --  30.9* 31.8* 35.7*  NEUTROABS 10.7* 10.3*  --   --   --   --  33.2*  HGB 8.5 Repeated and verified X2.* 8.9* 8.9*   < > 8.5* 8.1* 8.6*  HCT 26.3 Repeated and verified X2.* 26.5* 27.9*   < > 24.8* 24.3* 24.3*  MCV 90.2 89.1 92.7  --  87.9 89.0 85.6  PLT 280.0 447.0* 501*  --  359 342 323   < > = values in this interval not displayed.   Blood Culture    Component Value Date/Time   SDES IN/OUT CATH URINE 08/17/2020 1234   SPECREQUEST  08/17/2020 1234    NONE Performed at [place] [address], [place], [place] [address]    CULT 4,000 COLONIES/mL ENTEROCOCCUS GALLINARUM (A) 08/17/2020 1234   REPTSTATUS 08/21/2020 FINAL 08/17/2020 1234     Physical Exam General: Ill appearing, nad  Heart: RRR  Lungs: Clear bilaterally  Abdomen: soft mild tenderness to palpation  Extremities: No LE edema  Dialysis Access: LUE AVF +bruit   Medications:  . sodium chloride   Intravenous Once  . Chlorhexidine Gluconate Cloth  6 each Topical Q0600  . cinacalcet  60 mg Oral Q breakfast   . darbepoetin (ARANESP) injection - DIALYSIS  60 mcg Intravenous Q Sat-HD  . pantoprazole (PROTONIX) IV  40 mg Intravenous Q24H  . saccharomyces boulardii  250 mg Oral BID  . vancomycin  500 mg Oral QID    Dialysis Orders:  [facility]TS 4h 450/500 58kg 3K/2Ca UFP2 L AVF No heparin  Mircera 50 (to start 2/22)  Venofer 50  Hectorol 7   Assessment/Plan: 1. BRBPR/ABLA  Hx GI bleed/rectal ca - s/p EGD 2/9 with duodenal ulcer, treated w/ clip+ injection. On Protonix. Per Primary/GI. S/p 1 u prbcs 2/19. Hgb 6.8 >8.6  2. ESRD - HD TTS. Back on schedule Next HD 2/22.  3. C. Diff colitis. On PO Vanc/Florastor  4. AFib- on coumadin. Supratherapeutic INR on admission.  Reverse and hold Coumadin per primary  5. HTNVolume - BP/Volume stable. UF to EDW as tolerated. 6. Anemia CKD -- s/p Aranesp 60 on 2/12 here. Resume ESA with HD 2/19.  7. MBD CKD --Corr Ca ok. Continue home binders/VDRA  8. COPD  9. H/o prior rectal cancer - in 2014 treated w/ chemoRx, radiation, laparoscopic low anterior resection with  diverting loop ileostomy and subsequent ileostomy takedown.    Lynnda Child PA-C [name] 08/26/2020,10:08 AM

## 2020-08-26 NOTE — Plan of Care (Signed)
  Problem: Education: Goal: Knowledge of General Education information will improve Description: Including pain rating scale, medication(s)/side effects and non-pharmacologic comfort measures Outcome: Progressing   Problem: Clinical Measurements: Goal: Respiratory complications will improve Outcome: Progressing   Problem: Activity: Goal: Risk for activity intolerance will decrease Outcome: Progressing   Problem: Nutrition: Goal: Adequate nutrition will be maintained Outcome: Progressing   Problem: Coping: Goal: Level of anxiety will decrease Outcome: Progressing   Problem: Elimination: Goal: Will not experience complications related to bowel motility Outcome: Progressing   Problem: Pain Managment: Goal: General experience of comfort will improve Outcome: Progressing   Problem: Safety: Goal: Ability to remain free from injury will improve Outcome: Progressing

## 2020-08-26 NOTE — Progress Notes (Addendum)
Progress Note  Chief Complaint:    C-diff and rectal bleeding     ASSESSMENT / PLAN:    # 75 yo male with C-diff infection in setting of recent antibiotics.  --Continue Vancomycin suspension 150 mg QID on day # 3.  --Continue Florastor BID  # Rectal bleeding / acute on chronic anemia. Rectal bleeding improving but not resolved.  Likely related to C-diff colitis ( left colitis on CT scan) or diverticular hemorrhage in setting of supratherpeutic INR ( 8 range) INR down to 1.8 this am.   --Per Nursing Assistant he had one episode of rectal bleeding last night. No episodes this am but on my exam his pad has a moderate amount of dark red blood on it. I repeated DRE and there is dark red blood and small clot in vault.  --I do think the bleeding has slowed down after the additional Vit K and FFP yesterday. His hgb remained stable overnight at 8.6 after unit of blood -- Will consider CTA if continues to bleed . --I asked Nursing Tech to notify GI if he has further bleeding today  # Recent upper GI bleed with findings of duodenal ulcer with visible vessel treated with injection and clip on 08/20/20.  --Continue PPI  # Chronic diarrhea, extensive testing has been unrevealing. Followed by Dr. Loletha Carrow. On Creon.  # History of rectal cancer, status post laparoscopicLAR withdiverting loop ileostomy 05/23/2013. Final pathology showed a 3.2 cm invasive adenocarcinoma with neoadjuvant related change. Tumor invaded into perirectal soft tissue. There was no lymphovascular or perineural invasion. 14 lymph nodes were negative for tumor. Surgical margins were negative. A 12 mm tubular adenoma without HGD was removed at time of last colonoscopy in May 2021  # ESRD on HD Tu, Thurs, Sat.      Attending Physician Note   I have taken an interval history, reviewed the chart and examined the patient. I agree with the Advanced Practitioner's note, impression and recommendations.   * C diff colitis  with rectal and sigmoid inflammatory changes on CT. Diarrhea persists however it improved. WBC has increased to 35.7. Continue vanco 125 mg po qid and Florastor 250 mg po bid. Trend CBC, abdominal exam.  * Hematochezia in setting of coagulopathy very likely due to C diff colitis or diverticulosis. Blood is darker and lower volume today. Continue to correct coagulopathy and trend CBC. No plans for addition evaluation at this time.  * Recent DU with bleed, resolved. Continue PPI long term.  Lucio Edward, MD FACG [phone]       SUBJECTIVE:   Endorses some generalized abdominal pain.     OBJECTIVE:    Scheduled inpatient medications:  . sodium chloride   Intravenous Once  . Chlorhexidine Gluconate Cloth  6 each Topical Q0600  . cinacalcet  60 mg Oral Q breakfast  . darbepoetin (ARANESP) injection - DIALYSIS  60 mcg Intravenous Q Sat-HD  . pantoprazole (PROTONIX) IV  40 mg Intravenous Q24H  . saccharomyces boulardii  250 mg Oral BID  . vancomycin  500 mg Oral QID   Continuous inpatient infusions:  PRN inpatient medications: acetaminophen **OR** acetaminophen, fentaNYL (SUBLIMAZE) injection, hyoscyamine, ondansetron **OR** ondansetron (ZOFRAN) IV  Vital signs in last 24 hours: Temp:  [97.9 F (36.6 C)-99.5 F (37.5 C)] 98.2 F (36.8 C) (02/20 0900) Pulse Rate:  [84-94] 93 (02/20 0900) Resp:  [11-19] 15 (02/20 0900) BP: (76-152)/(49-82) 143/71 (02/20 0900) SpO2:  [95 %-100 %] 100 % (02/20 0900) Last  BM Date: 08/23/20  Intake/Output Summary (Last 24 hours) at 08/26/2020 1040 Last data filed at 08/26/2020 0900 Gross per 24 hour  Intake 1028 ml  Output 2406 ml  Net -1378 ml     Physical Exam:  General: Alert thin male in NAD Heart:  Regular rate and rhythm. No lower extremity edema Pulmonary: Normal respiratory effort Abdomen: Soft, nondistended, nontender. Normal bowel sounds.  Rectal: dark red blood and small clot in vault, anal stricture Neurologic: Alert and  oriented Psych: Pleasant. Cooperative.   There were no vitals filed for this visit.  Intake/Output from previous day: 02/19 0701 - 02/20 0700 In: 1081.5 [I.V.:130; Blood:901.5; IV Piggyback:50] Out: 1807 [Stool:2] Intake/Output this shift: Total I/O In: 360 [P.O.:360] Out: 600 [Urine:600]    Lab Results: Recent Labs    08/25/20 1006 08/25/20 1413 08/26/20 0649  WBC 30.9* 31.8* 35.7*  HGB 8.5* 8.1* 8.6*  HCT 24.8* 24.3* 24.3*  PLT 359 342 323   BMET Recent Labs    08/24/20 1511 08/25/20 1006 08/25/20 1413  NA 133* 133* 133*  K 4.7 5.5* 5.3*  CL 91* 97* 96*  CO2 25 22 19*  GLUCOSE 87 76 74  BUN 45* 52* 55*  CREATININE 9.08* 9.59* 9.76*  CALCIUM 7.9* 7.2* 7.5*   LFT Recent Labs    08/25/20 1006 08/25/20 1413  PROT 5.2*  --   ALBUMIN 2.3* 2.3*  AST 12*  --   ALT 9  --   ALKPHOS 82  --   BILITOT 0.9  --    PT/INR Recent Labs    08/25/20 1006 08/26/20 0649  LABPROT 27.4* 20.3*  INR 2.6* 1.8*    CT ABDOMEN PELVIS WO CONTRAST  Result Date: 08/24/2020 CLINICAL DATA:  Acute abdominal pain EXAM: CT ABDOMEN AND PELVIS WITHOUT CONTRAST TECHNIQUE: Multidetector CT imaging of the abdomen and pelvis was performed following the standard protocol without IV contrast. COMPARISON:  None. FINDINGS: LOWER CHEST: Normal. HEPATOBILIARY: Normal hepatic contours. No intra- or extrahepatic biliary dilatation. The gallbladder is normal. PANCREAS: Normal pancreas. No ductal dilatation or peripancreatic fluid collection. SPLEEN: Normal. ADRENALS/URINARY TRACT: The adrenal glands are normal. Hyperdense right renal cyst measures 0.5 cm. Hyperdense left renal lesion measures 1.3 cm. No hydronephrosis. The urinary bladder is normal for degree of distention STOMACH/BOWEL: There is no hiatal hernia. Normal duodenal course and caliber. No small bowel dilatation or inflammation. There is wall thickening of the sigmoid colon and rectum with surrounding inflammatory stranding. No free  intraperitoneal air or abscess. Normal appendix. VASCULAR/LYMPHATIC: There is calcific atherosclerosis of the abdominal aorta. No lymphadenopathy. REPRODUCTIVE: Normal prostate size with symmetric seminal vesicles. MUSCULOSKELETAL. No bony spinal canal stenosis or focal osseous abnormality. OTHER: None. IMPRESSION: 1. Wall thickening of the sigmoid colon and rectum with surrounding inflammatory stranding, consistent with acute colitis/proctitis. No free intraperitoneal air or abscess. Aortic Atherosclerosis (ICD10-I70.0). Electronically Signed   By: Ulyses Jarred M.D.   On: 08/24/2020 23:37    PREVIOUS ENDOSCOPIES:    Active Problems:   Hematochezia   GIB (gastrointestinal bleeding)   C. difficile colitis     LOS: 2 days   Tye Savoy, NP 08/26/2020, 10:40 AM

## 2020-08-27 ENCOUNTER — Inpatient Hospital Stay: Payer: Medicare Other | Admitting: Family

## 2020-08-27 DIAGNOSIS — A0472 Enterocolitis due to Clostridium difficile, not specified as recurrent: Secondary | ICD-10-CM | POA: Diagnosis not present

## 2020-08-27 DIAGNOSIS — K921 Melena: Secondary | ICD-10-CM | POA: Diagnosis not present

## 2020-08-27 LAB — CBC WITH DIFFERENTIAL/PLATELET
Abs Immature Granulocytes: 0.34 10*3/uL — ABNORMAL HIGH (ref 0.00–0.07)
Basophils Absolute: 0.1 10*3/uL (ref 0.0–0.1)
Basophils Relative: 0 %
Eosinophils Absolute: 0 10*3/uL (ref 0.0–0.5)
Eosinophils Relative: 0 %
HCT: 23.4 % — ABNORMAL LOW (ref 39.0–52.0)
Hemoglobin: 7.8 g/dL — ABNORMAL LOW (ref 13.0–17.0)
Immature Granulocytes: 1 %
Lymphocytes Relative: 3 %
Lymphs Abs: 0.8 10*3/uL (ref 0.7–4.0)
MCH: 29.5 pg (ref 26.0–34.0)
MCHC: 33.3 g/dL (ref 30.0–36.0)
MCV: 88.6 fL (ref 80.0–100.0)
Monocytes Absolute: 1.8 10*3/uL — ABNORMAL HIGH (ref 0.1–1.0)
Monocytes Relative: 7 %
Neutro Abs: 24.2 10*3/uL — ABNORMAL HIGH (ref 1.7–7.7)
Neutrophils Relative %: 89 %
Platelets: 361 10*3/uL (ref 150–400)
RBC: 2.64 MIL/uL — ABNORMAL LOW (ref 4.22–5.81)
RDW: 17.8 % — ABNORMAL HIGH (ref 11.5–15.5)
WBC: 27.3 10*3/uL — ABNORMAL HIGH (ref 4.0–10.5)
nRBC: 0 % (ref 0.0–0.2)

## 2020-08-27 MED ORDER — DOXERCALCIFEROL 4 MCG/2ML IV SOLN
7.0000 ug | INTRAVENOUS | Status: DC
  Administered 2020-08-28 – 2020-09-04 (×4): 7 ug via INTRAVENOUS
  Filled 2020-08-27 (×6): qty 4

## 2020-08-27 NOTE — Telephone Encounter (Signed)
Thank you for the update.  I will communicate with the inpatient consult team since I know this patient well.  - HD

## 2020-08-27 NOTE — Progress Notes (Signed)
PROGRESS NOTE    Jason RICO Sr.  TZG:[id number] DOB: May 06, 1946 DOA: 08/24/2020 PCP: [name], MD    Brief Narrative:  75 year old gentleman with history of chronic A. fib on Coumadin, peptic ulcer disease, ESRD on hemodialysis TTS schedule who presented to the ER with abdominal cramping, nausea vomiting diarrhea and rectal bleeding.  Patient was recently in the hospital 2/9-2/13 with upper GI bleeding and found to have bleeding duodenal ulcer treated with epinephrine injection and clipping.  Discharged on Protonix twice daily.Patient also has chronic diarrhea.  He has history of rectal cancer with resection.  Admitted with rectal bleeding, C. difficile infection and diarrhea and anemia.  Patient was hemodynamically stable in the ER. Admitted with C. diff, rectal bleeding. Followed by GI.  Assessment & Plan:   Active Problems:   Hematochezia   GIB (gastrointestinal bleeding)   C. difficile colitis  C. difficile colitis with diarrhea and abdominal pain: First episode.  CT scan with evidence of proctitis, probably radiation changes. With worsening WBC count and abdominal pain, treated with higher dose of oral vancomycin.  Some clinical improvement.  Tolerating full liquid diet.  Will advance to soft diet and monitor.  Lower GI bleeding, painless rectal bleeding: Anemia of acute blood loss. Suspect outlet bleeding from proctitis/radiation injury with ongoing diarrhea and supratherapeutic INR. Currently hemodynamically stabilizing. Supratherapeutic INR, presented with 8.8-aggressively reversed with 2 units of FFP, IV vitamin K. INR is 1.8, would not need anymore reversal. Hemoglobin 8.9-7.6-6.8-1 unit of transfusion-8.1-1 unit of transfusion 8.6. Received total 2 units of PRBC and is stabilizing. Followed by GI, anticipate conservative management with active C. difficile infection. On reduced dose of Protonix for duodenal ulcer due to concomitant C. difficile.  Acquired  thrombophilia, chronic A. fib on Coumadin with supratherapeutic INR: Presented with INR of 8.8, on Coumadin and probably supratherapeutic due to acute illnesses. Aggressively reversed. Continue to hold Coumadin, no need for further reversal. A. fib is rate controlled.  ESRD on hemodialysis: Stable.  Getting dialysis.  Patient is stabilizing.  Start working with therapies.  DVT prophylaxis: SCDs Start: 08/24/20 2254   Code Status: Full code Family Communication: [name] at the bedside. Disposition Plan: Status is: Inpatient  Remains inpatient appropriate because:Persistent severe electrolyte disturbances and IV treatments appropriate due to intensity of illness or inability to take PO   Dispo: The patient is from: Home              Anticipated d/c is to: Home with home health therapies.              Anticipated d/c date is: 2 days              Patient currently is not medically stable to d/c.   Difficult to place patient No  Patient is medically stabilizing.  Start working with PT OT today.       Consultants:   Gastroenterology  Nephrology  Procedures:   Routine dialysis  Antimicrobials:   Vancomycin by mouth 2/18---   Subjective: Patient seen and examined.  Wife at the bedside.  He does have some abdominal discomfort and growling belly but denies any vomiting. Reported to rectal bleeding since yesterday morning, last night noted to have very small as per nursing report.   Objective: Vitals:   08/26/20 0900 08/26/20 1800 08/26/20 1900 08/27/20 0251  BP: (!) 143/71 (!) 143/76 (!) 146/67 (!) 152/81  Pulse: 93 94 96 95  Resp: 15  17 16   Temp: 98.2 F (  36.8 C) 98.1 F (36.7 C) 98.4 F (36.9 C) 97.9 F (36.6 C)  TempSrc: Oral Oral Oral Oral  SpO2: 100% 100% 100% 92%  Height:        Intake/Output Summary (Last 24 hours) at 08/27/2020 1109 Last data filed at 08/26/2020 1320 Gross per 24 hour  Intake --  Output 1 ml  Net -1 ml   There were no  vitals filed for this visit.  Examination:  General exam: Chronically sick looking, frail and debilitated gentleman sitting at the edge of the bed and eating.  Not in any distress. Respiratory system: Clear to auscultation. Respiratory effort normal.  No added sounds. Cardiovascular system: S1 & S2 heard, RRR.  Gastrointestinal system: Soft and nontender.  Bowel sounds present. Central nervous system: Alert and oriented. No focal neurological deficits.  Generalized weakness. AV fistula with thrill on left upper extremity.     Data Reviewed: I have personally reviewed following labs and imaging studies  CBC: Recent Labs  Lab 08/20/20 1517 08/23/20 1459 08/24/20 1511 08/24/20 2144 08/25/20 0253 08/25/20 1006 08/25/20 1413 08/26/20 0649 08/27/20 0545  WBC 13.8* 13.1* 17.4*  --   --  30.9* 31.8* 35.7* 27.3*  NEUTROABS 10.7* 10.3*  --   --   --   --   --  33.2* 24.2*  HGB 8.5 Repeated and verified X2.* 8.9* 8.9*   < > 6.8* 8.5* 8.1* 8.6* 7.8*  HCT 26.3 Repeated and verified X2.* 26.5* 27.9*   < > 20.7* 24.8* 24.3* 24.3* 23.4*  MCV 90.2 89.1 92.7  --   --  87.9 89.0 85.6 88.6  PLT 280.0 447.0* 501*  --   --  359 342 323 361   < > = values in this interval not displayed.   Basic Metabolic Panel: Recent Labs  Lab 08/24/20 1511 08/25/20 1006 08/25/20 1413 08/26/20 0649  NA 133* 133* 133*  --   K 4.7 5.5* 5.3*  --   CL 91* 97* 96*  --   CO2 25 22 19*  --   GLUCOSE 87 76 74  --   BUN 45* 52* 55*  --   CREATININE 9.08* 9.59* 9.76*  --   CALCIUM 7.9* 7.2* 7.5*  --   MG  --   --   --  1.8  PHOS  --   --  5.9*  --    GFR: Estimated Creatinine Clearance: 5.5 mL/min (A) (by C-G formula based on SCr of 9.76 mg/dL (H)). Liver Function Tests: Recent Labs  Lab 08/24/20 1511 08/25/20 1006 08/25/20 1413  AST 15 12*  --   ALT 9 9  --   ALKPHOS 99 82  --   BILITOT 0.7 0.9  --   PROT 7.1 5.2*  --   ALBUMIN 3.3* 2.3* 2.3*   No results for input(s): LIPASE, AMYLASE in the  last 168 hours. No results for input(s): AMMONIA in the last 168 hours. Coagulation Profile: Recent Labs  Lab 08/20/20 1517 08/24/20 1536 08/25/20 1006 08/26/20 0649  INR 2.1* 8.8* 2.6* 1.8*   Cardiac Enzymes: No results for input(s): CKTOTAL, CKMB, CKMBINDEX, TROPONINI in the last 168 hours. BNP (last 3 results) No results for input(s): PROBNP in the last 8760 hours. HbA1C: No results for input(s): HGBA1C in the last 72 hours. CBG: No results for input(s): GLUCAP in the last 168 hours. Lipid Profile: No results for input(s): CHOL, HDL, LDLCALC, TRIG, CHOLHDL, LDLDIRECT in the last 72 hours. Thyroid Function Tests: No results for  input(s): TSH, T4TOTAL, FREET4, T3FREE, THYROIDAB in the last 72 hours. Anemia Panel: No results for input(s): VITAMINB12, FOLATE, FERRITIN, TIBC, IRON, RETICCTPCT in the last 72 hours. Sepsis Labs: No results for input(s): PROCALCITON, LATICACIDVEN in the last 168 hours.  Recent Results (from the past 240 hour(s))  Urine culture     Status: Abnormal   Collection Time: 08/17/20 12:34 PM   Specimen: In/Out Cath Urine  Result Value Ref Range Status   Specimen Description IN/OUT CATH URINE  Final   Special Requests   Final    NONE Performed at [facility], [address] [place], [place] [address]    Culture 4,000 COLONIES/mL ENTEROCOCCUS GALLINARUM (A)  Final   Report Status 08/21/2020 FINAL  Final   Organism ID, Bacteria ENTEROCOCCUS GALLINARUM (A)  Final      Susceptibility   Enterococcus gallinarum - MIC*    AMPICILLIN <=2 SENSITIVE Sensitive     NITROFURANTOIN <=16 SENSITIVE Sensitive     VANCOMYCIN RESISTANT Resistant     LINEZOLID 1 SENSITIVE Sensitive     * 4,000 COLONIES/mL ENTEROCOCCUS GALLINARUM  Resp Panel by RT-PCR (Flu A&B, Covid) Nasopharyngeal Swab     Status: None   Collection Time: 08/24/20  3:40 PM   Specimen: Nasopharyngeal Swab; Nasopharyngeal(NP) swabs in vial transport medium  Result Value Ref Range Status    SARS Coronavirus 2 by RT PCR NEGATIVE NEGATIVE Final    Comment: (NOTE) SARS-CoV-2 target nucleic acids are NOT DETECTED.  The SARS-CoV-2 RNA is generally detectable in upper respiratory specimens during the acute phase of infection. The lowest concentration of SARS-CoV-2 viral copies this assay can detect is 138 copies/mL. A negative result does not preclude SARS-Cov-2 infection and should not be used as the sole basis for treatment or other patient management decisions. A negative result may occur with  improper specimen collection/handling, submission of specimen other than nasopharyngeal swab, presence of viral mutation(s) within the areas targeted by this assay, and inadequate number of viral copies(<138 copies/mL). A negative result must be combined with clinical observations, patient history, and epidemiological information. The expected result is Negative.  Fact Sheet for Patients:  EntrepreneurPulse.com.au  Fact Sheet for Healthcare Providers:  IncredibleEmployment.be  This test is no t yet approved or cleared by the [place] FDA and  has been authorized for detection and/or diagnosis of SARS-CoV-2 by FDA under an Emergency Use Authorization (EUA). This EUA will remain  in effect (meaning this test can be used) for the duration of the COVID-19 declaration under Section 564(b)(1) of the Act, 21 U.S.C.section 360bbb-3(b)(1), unless the authorization is terminated  or revoked sooner.       Influenza A by PCR NEGATIVE NEGATIVE Final   Influenza B by PCR NEGATIVE NEGATIVE Final    Comment: (NOTE) The Xpert Xpress SARS-CoV-2/FLU/RSV plus assay is intended as an aid in the diagnosis of influenza from Nasopharyngeal swab specimens and should not be used as a sole basis for treatment. Nasal washings and aspirates are unacceptable for Xpert Xpress SARS-CoV-2/FLU/RSV testing.  Fact Sheet for  Patients: EntrepreneurPulse.com.au  Fact Sheet for Healthcare Providers: IncredibleEmployment.be  This test is not yet approved or cleared by the [place] FDA and has been authorized for detection and/or diagnosis of SARS-CoV-2 by FDA under an Emergency Use Authorization (EUA). This EUA will remain in effect (meaning this test can be used) for the duration of the COVID-19 declaration under Section 564(b)(1) of the Act, 21 U.S.C. section 360bbb-3(b)(1), unless the authorization is terminated  or revoked.  Performed at [facility], [place] [address], [place], [place] [address]   MRSA PCR Screening     Status: None   Collection Time: 08/26/20  8:05 AM   Specimen: Nasal Mucosa; Nasopharyngeal  Result Value Ref Range Status   MRSA by PCR NEGATIVE NEGATIVE Final    Comment:        The GeneXpert MRSA Assay (FDA approved for NASAL specimens only), is one component of a comprehensive MRSA colonization surveillance program. It is not intended to diagnose MRSA infection nor to guide or monitor treatment for MRSA infections. Performed at [facility], [place] [address], [place], [place] [address]          Radiology Studies: No results found.      Scheduled Meds: . sodium chloride   Intravenous Once  . Chlorhexidine Gluconate Cloth  6 each Topical Q0600  . darbepoetin (ARANESP) injection - DIALYSIS  60 mcg Intravenous Q Sat-HD  . [START ON 08/28/2020] doxercalciferol  7 mcg Intravenous Q T,Th,Sa-HD  . pantoprazole (PROTONIX) IV  40 mg Intravenous Q24H  . saccharomyces boulardii  250 mg Oral BID  . vancomycin  500 mg Oral QID   Continuous Infusions:    LOS: 3 days    Time spent: 34 minutes    Barb Merino, MD [facility] Pager [phone]

## 2020-08-27 NOTE — Progress Notes (Signed)
[facility] Progress Note   Subjective:  Seen at bedside. Some continued abdominal discomfort, diarrhea improving.    Objective Vitals:   08/26/20 0900 08/26/20 1800 08/26/20 1900 08/27/20 0251  BP: (!) 143/71 (!) 143/76 (!) 146/67 (!) 152/81  Pulse: 93 94 96 95  Resp: 15  17 16   Temp: 98.2 F (36.8 C) 98.1 F (36.7 C) 98.4 F (36.9 C) 97.9 F (36.6 C)  TempSrc: Oral Oral Oral Oral  SpO2: 100% 100% 100% 92%  Height:           Additional Objective Labs: Basic Metabolic Panel: Recent Labs  Lab 08/24/20 1511 08/25/20 1006 08/25/20 1413  NA 133* 133* 133*  K 4.7 5.5* 5.3*  CL 91* 97* 96*  CO2 25 22 19*  GLUCOSE 87 76 74  BUN 45* 52* 55*  CREATININE 9.08* 9.59* 9.76*  CALCIUM 7.9* 7.2* 7.5*  PHOS  --   --  5.9*   CBC: Recent Labs  Lab 08/23/20 1459 08/24/20 1511 08/24/20 2144 08/25/20 1006 08/25/20 1413 08/26/20 0649 08/27/20 0545  WBC 13.1* 17.4*  --  30.9* 31.8* 35.7* 27.3*  NEUTROABS 10.3*  --   --   --   --  33.2* 24.2*  HGB 8.9* 8.9*   < > 8.5* 8.1* 8.6* 7.8*  HCT 26.5* 27.9*   < > 24.8* 24.3* 24.3* 23.4*  MCV 89.1 92.7  --  87.9 89.0 85.6 88.6  PLT 447.0* 501*  --  359 342 323 361   < > = values in this interval not displayed.   Blood Culture    Component Value Date/Time   SDES IN/OUT CATH URINE 08/17/2020 1234   SPECREQUEST  08/17/2020 1234    NONE Performed at [facility] [address], [place], [place] [address]    CULT 4,000 COLONIES/mL ENTEROCOCCUS GALLINARUM (A) 08/17/2020 1234   REPTSTATUS 08/21/2020 FINAL 08/17/2020 1234     Physical Exam General: Ill appearing, fatigued,  nad  Heart: RRR  Lungs: Clear bilaterally  Abdomen: soft mild tenderness to palpation  Extremities: No LE edema  Dialysis Access: LUE AVF +bruit   Medications:  . sodium chloride   Intravenous Once  . Chlorhexidine Gluconate Cloth  6 each Topical Q0600  . cinacalcet  60 mg Oral Q breakfast  . darbepoetin (ARANESP) injection -  DIALYSIS  60 mcg Intravenous Q Sat-HD  . pantoprazole (PROTONIX) IV  40 mg Intravenous Q24H  . saccharomyces boulardii  250 mg Oral BID  . vancomycin  500 mg Oral QID    Dialysis Orders:  [facility] 4h 450/500 58kg 3K/2Ca UFP2 L AVF No heparin  Mircera 50 (to start 2/22)  Venofer 50  Hectorol 7   Assessment/Plan: 1. BRBPR/ABLA  Hx GI bleed/rectal ca - s/p EGD 2/9 with duodenal ulcer, treated w/ clip+ injection. On Protonix. Per Primary/GI. S/p 1 u prbcs 2/19. Hgb 6.8 >8.6 >7.8  2. ESRD - HD TTS. Back on schedule Next HD 2/22.  3. C. Diff colitis. On PO Vanc/Florastor  4. AFib- on coumadin. Supratherapeutic INR on admission.  Reversed with Vit K. Holding Coumadin.  5. HTNVolume - BP/Volume stable. UF to EDW as tolerated. 6. Anemia CKD -- s/p Aranesp 60 on 2/12 here. Resume ESA with HD 2/19.  7. MBD CKD --Corr Ca ok. Continue home binders when taking PO.  Hold Sensipar for now.  8. COPD  9. H/o prior rectal cancer - in 2014 treated w/ chemoRx, radiation, laparoscopic low anterior resection with diverting  loop ileostomy and subsequent ileostomy takedown.    Lynnda Child PA-C [facility] 08/27/2020,11:02 AM

## 2020-08-27 NOTE — Progress Notes (Signed)
Patient did not show for his appointment on 2/22- I reached out to RN in charge of case management and asked her to discuss further with patient's PCP.

## 2020-08-27 NOTE — Progress Notes (Signed)
          Daily Rounding Note  08/27/2020, 10:36 AM  LOS: 3 days   SUBJECTIVE:   Chief complaint: C diff colitis.  Rectal bleeding    Pt c/o ongoing pain in right abdomen, minor nausea.  Tolerating full liquids but not taking in much.  Passed blood this AM.  1 or 2 stools loose yesterday, no stools this AM  OBJECTIVE:         Vital signs in last 24 hours:    Temp:  [97.9 F (36.6 C)-98.4 F (36.9 C)] 97.9 F (36.6 C) (02/21 0251) Pulse Rate:  [94-96] 95 (02/21 0251) Resp:  [16-17] 16 (02/21 0251) BP: (143-152)/(67-81) 152/81 (02/21 0251) SpO2:  [92 %-100 %] 92 % (02/21 0251) Last BM Date: 08/26/20 There were no vitals filed for this visit. General: looks ill, tired, weak.    Heart: RRR Chest: no dyspnea Abdomen: soft, tender w/o g or r on right.  BS present.  ND  Extremities: non-pitting LE edema Neuro/Psych:  Lethargic, follows commands.  Laconic.    Intake/Output from previous day: 02/20 0701 - 02/21 0700 In: 360 [P.O.:360] Out: 601 [Urine:600; Stool:1]  Intake/Output this shift: No intake/output data recorded.  Lab Results: Recent Labs    08/25/20 1413 08/26/20 0649 08/27/20 0545  WBC 31.8* 35.7* 27.3*  HGB 8.1* 8.6* 7.8*  HCT 24.3* 24.3* 23.4*  PLT 342 323 361   BMET Recent Labs    08/24/20 1511 08/25/20 1006 08/25/20 1413  NA 133* 133* 133*  K 4.7 5.5* 5.3*  CL 91* 97* 96*  CO2 25 22 19*  GLUCOSE 87 76 74  BUN 45* 52* 55*  CREATININE 9.08* 9.59* 9.76*  CALCIUM 7.9* 7.2* 7.5*   LFT Recent Labs    08/24/20 1511 08/25/20 1006 08/25/20 1413  PROT 7.1 5.2*  --   ALBUMIN 3.3* 2.3* 2.3*  AST 15 12*  --   ALT 9 9  --   ALKPHOS 99 82  --   BILITOT 0.7 0.9  --    PT/INR Recent Labs    08/25/20 1006 08/26/20 0649  LABPROT 27.4* 20.3*  INR 2.6* 1.8*   Hepatitis Panel No results for input(s): HEPBSAG, HCVAB, HEPAIGM, HEPBIGM in the last 72 hours.  Studies/Results: No results  found.  ASSESMENT:   *    C. Difficile.  Stools less frequent.   Background of chronic and non-C. diff diarrhea. CT scan showing inflammatory changes in the sigmoid. Day 3 Vancomycin 125 mg qid >> then 500  mg po qid day.  D 3 Florastor.   WBCs 35 >> 27 K.  *     Rectal bleeding, hematochezia in setting of coagulopathy (INR >> 8)  *    Coagulopathy, corrected after IV/ po Vit K..  *    Acute on chronic anemia.  Aranesp  *    Recent upper GI bleed. 08/20/2020 EGD with duodenal ulcer w VV, treated with injection and clip.  *    Rectal cancer. LAR, diverting loop ileostomy 05/2013 neoadjuvant therapy. 11/2019 latest colonoscopy with TA, no HGD.  *    ESRD. HD TTS.   PLAN   *   Continue current therapies.      Azucena Freed  08/27/2020, 10:36 AM Phone [phone]

## 2020-08-27 NOTE — Telephone Encounter (Signed)
See hospital note from 08/24/20. Patient is being treated with vancomycin QID.

## 2020-08-28 ENCOUNTER — Ambulatory Visit (HOSPITAL_COMMUNITY): Payer: Medicare Other

## 2020-08-28 DIAGNOSIS — A0472 Enterocolitis due to Clostridium difficile, not specified as recurrent: Secondary | ICD-10-CM | POA: Diagnosis not present

## 2020-08-28 DIAGNOSIS — K921 Melena: Secondary | ICD-10-CM | POA: Diagnosis not present

## 2020-08-28 LAB — CBC WITH DIFFERENTIAL/PLATELET
Abs Immature Granulocytes: 0.32 10*3/uL — ABNORMAL HIGH (ref 0.00–0.07)
Basophils Absolute: 0.1 10*3/uL (ref 0.0–0.1)
Basophils Relative: 0 %
Eosinophils Absolute: 0 10*3/uL (ref 0.0–0.5)
Eosinophils Relative: 0 %
HCT: 25.3 % — ABNORMAL LOW (ref 39.0–52.0)
Hemoglobin: 8.7 g/dL — ABNORMAL LOW (ref 13.0–17.0)
Immature Granulocytes: 1 %
Lymphocytes Relative: 4 %
Lymphs Abs: 1.2 10*3/uL (ref 0.7–4.0)
MCH: 30.6 pg (ref 26.0–34.0)
MCHC: 34.4 g/dL (ref 30.0–36.0)
MCV: 89.1 fL (ref 80.0–100.0)
Monocytes Absolute: 2.1 10*3/uL — ABNORMAL HIGH (ref 0.1–1.0)
Monocytes Relative: 7 %
Neutro Abs: 25.3 10*3/uL — ABNORMAL HIGH (ref 1.7–7.7)
Neutrophils Relative %: 88 %
Platelets: 431 10*3/uL — ABNORMAL HIGH (ref 150–400)
RBC: 2.84 MIL/uL — ABNORMAL LOW (ref 4.22–5.81)
RDW: 17.6 % — ABNORMAL HIGH (ref 11.5–15.5)
WBC: 29 10*3/uL — ABNORMAL HIGH (ref 4.0–10.5)
nRBC: 0 % (ref 0.0–0.2)

## 2020-08-28 LAB — C. DIFFICILE GDH AND TOXIN A/B
GDH ANTIGEN: DETECTED
MICRO NUMBER:: 11547311
SPECIMEN QUALITY:: ADEQUATE
TOXIN A AND B: DETECTED

## 2020-08-28 LAB — VITAMIN B3
Nicotinamide: 69 ng/mL
Nicotinic Acid: <20 ng/mL

## 2020-08-28 LAB — OVA AND PARASITE EXAMINATION
CONCENTRATE RESULT:: NONE SEEN
MICRO NUMBER:: 11548012
SPECIMEN QUALITY:: ADEQUATE
TRICHROME RESULT:: NONE SEEN

## 2020-08-28 LAB — RENAL FUNCTION PANEL
Albumin: 2.4 g/dL — ABNORMAL LOW (ref 3.5–5.0)
Anion gap: 14 (ref 5–15)
BUN: 49 mg/dL — ABNORMAL HIGH (ref 8–23)
CO2: 23 mmol/L (ref 22–32)
Calcium: 8 mg/dL — ABNORMAL LOW (ref 8.9–10.3)
Chloride: 96 mmol/L — ABNORMAL LOW (ref 98–111)
Creatinine, Ser: 7.81 mg/dL — ABNORMAL HIGH (ref 0.61–1.24)
GFR, Estimated: 7 mL/min — ABNORMAL LOW (ref >60)
Glucose, Bld: 91 mg/dL (ref 70–99)
Phosphorus: 5.1 mg/dL — ABNORMAL HIGH (ref 2.5–4.6)
Potassium: 4.6 mmol/L (ref 3.5–5.1)
Sodium: 133 mmol/L — ABNORMAL LOW (ref 135–145)

## 2020-08-28 LAB — GLUCOSE, CAPILLARY: Glucose-Capillary: 80 mg/dL (ref 70–99)

## 2020-08-28 LAB — PREALBUMIN: Prealbumin: 19 mg/dL — ABNORMAL LOW (ref 21–43)

## 2020-08-28 MED ORDER — HEPARIN SODIUM (PORCINE) 1000 UNIT/ML DIALYSIS: 1000.0000 [IU] | INTRAMUSCULAR | Status: DC | PRN

## 2020-08-28 MED ORDER — HYDROMORPHONE HCL 1 MG/ML IJ SOLN
0.5000 mg | INTRAMUSCULAR | Status: DC | PRN
  Administered 2020-08-28 – 2020-09-14 (×67): 0.5 mg via INTRAVENOUS
  Filled 2020-08-28 (×2): qty 1
  Filled 2020-08-28 (×2): qty 0.5
  Filled 2020-08-28 (×13): qty 1
  Filled 2020-08-28: qty 0.5
  Filled 2020-08-28 (×5): qty 1
  Filled 2020-08-28: qty 0.5
  Filled 2020-08-28 (×7): qty 1
  Filled 2020-08-28 (×2): qty 0.5
  Filled 2020-08-28 (×22): qty 1
  Filled 2020-08-28: qty 0.5
  Filled 2020-08-28 (×3): qty 1
  Filled 2020-08-28: qty 0.5
  Filled 2020-08-28 (×5): qty 1
  Filled 2020-08-28: qty 0.5
  Filled 2020-08-28: qty 1

## 2020-08-28 MED ORDER — LIDOCAINE-PRILOCAINE 2.5-2.5 % EX CREA
1.0000 "application " | TOPICAL_CREAM | CUTANEOUS | Status: DC | PRN
  Filled 2020-08-28: qty 5

## 2020-08-28 MED ORDER — PENTAFLUOROPROP-TETRAFLUOROETH EX AERO: 1.0000 "application " | INHALATION_SPRAY | CUTANEOUS | Status: DC | PRN

## 2020-08-28 MED ORDER — DOXERCALCIFEROL 4 MCG/2ML IV SOLN
INTRAVENOUS | Status: AC
  Filled 2020-08-28: qty 4

## 2020-08-28 MED ORDER — ALTEPLASE 2 MG IJ SOLR: 2.0000 mg | Freq: Once | INTRAMUSCULAR | Status: DC | PRN

## 2020-08-28 MED ORDER — PANTOPRAZOLE SODIUM 40 MG PO TBEC
40.0000 mg | DELAYED_RELEASE_TABLET | Freq: Two times a day (BID) | ORAL | Status: DC
  Administered 2020-08-28 – 2020-08-31 (×5): 40 mg via ORAL
  Filled 2020-08-28 (×6): qty 1

## 2020-08-28 MED ORDER — PROMETHAZINE HCL 25 MG/ML IJ SOLN
12.5000 mg | Freq: Four times a day (QID) | INTRAMUSCULAR | Status: DC | PRN
  Administered 2020-08-28 – 2020-09-08 (×6): 12.5 mg via INTRAVENOUS
  Filled 2020-08-28 (×8): qty 1

## 2020-08-28 MED ORDER — SODIUM CHLORIDE 0.9 % IV SOLN: 100.0000 mL | INTRAVENOUS | Status: DC | PRN

## 2020-08-28 MED ORDER — LIDOCAINE HCL (PF) 1 % IJ SOLN
5.0000 mL | INTRAMUSCULAR | Status: DC | PRN
  Filled 2020-08-28: qty 5

## 2020-08-28 MED FILL — Ondansetron HCl Inj 4 MG/2ML (2 MG/ML): INTRAMUSCULAR | Qty: 2 | Status: AC

## 2020-08-28 NOTE — Progress Notes (Signed)
PROGRESS NOTE    Jason [name] Sr.  DUK:[id number] DOB: 06/13/1946 DOA: 08/24/2020 PCP: [name], MD    Brief Narrative:  75 year old gentleman with history of chronic A. fib on Coumadin, peptic ulcer disease, ESRD on hemodialysis TTS schedule who presented to the ER with abdominal cramping, nausea vomiting diarrhea and rectal bleeding.  Patient was recently in the hospital 2/9-2/13 with upper GI bleeding and found to have bleeding duodenal ulcer treated with epinephrine injection and clipping.  Discharged on Protonix twice daily.Patient also has chronic diarrhea.  He has history of rectal cancer with resection.  Admitted with rectal bleeding, C. difficile infection and diarrhea and anemia.  Patient was hemodynamically stable in the ER. Admitted with C. diff, rectal bleeding. Followed by GI.  Assessment & Plan:   Active Problems:   Hematochezia   GIB (gastrointestinal bleeding)   C. difficile colitis  C. difficile colitis with diarrhea and abdominal pain: First episode.  CT scan with evidence of proctitis, probably radiation changes. With worsening WBC count and abdominal pain, treated with higher dose of oral vancomycin.  Persistent pain, plans for CTA as per GI. Diet as tolerated.  Today with worsening nausea and abdominal pain.  Symptomatic treatment.  Lower GI bleeding, painless rectal bleeding: Anemia of acute blood loss. Suspect outlet bleeding from proctitis/radiation injury with ongoing diarrhea and supratherapeutic INR. Currently hemodynamically stabilizing. Supratherapeutic INR, presented with 8.8-aggressively reversed with 2 units of FFP, IV vitamin K. Coumadin on hold. Hemoglobin 8.9-7.6-6.8-1 unit of transfusion-8.1-1 unit of transfusion 8.6. Received total 2 units of PRBC and is stabilizing. Followed by GI, anticipate conservative management with active C. difficile infection. On oral Protonix today.  Acquired thrombophilia, chronic A. fib on Coumadin with  supratherapeutic INR: Presented with INR of 8.8, on Coumadin and probably supratherapeutic due to acute illnesses. Aggressively reversed. Continue to hold Coumadin, no need for further reversal. A. fib is rate controlled.  ESRD on hemodialysis: Stable.  Getting dialysis.  Patient is stabilizing.  Start working with therapies. May need SNF  DVT prophylaxis: SCDs Start: 08/24/20 2254   Code Status: Full code Family Communication: [name] at the bedside 2/21 Disposition Plan: Status is: Inpatient  Remains inpatient appropriate because:Persistent severe electrolyte disturbances and IV treatments appropriate due to intensity of illness or inability to take PO   Dispo: The patient is from: Home              Anticipated d/c is to: SNF              Anticipated d/c date is: 2 days              Patient currently is not medically stable to d/c.   Difficult to place patient No  Patient is medically stabilizing.  Start working with PT OT today.       Consultants:   Gastroenterology  Nephrology  Procedures:   Routine dialysis  Antimicrobials:   Vancomycin by mouth 2/18---   Subjective: Decreased abdominal pain mostly on the lower abdomen.  Poor appetite. 2 bloody bowel movements overnight.  Had large emesis today afternoon after coming back from dialysis.   Objective: Vitals:   08/28/20 1130 08/28/20 1200 08/28/20 1210 08/28/20 1314  BP: 140/60 (!) 141/67 (!) 153/80 139/60  Pulse:    100  Resp: 14 14 14 18   Temp:   98 F (36.7 C) 98 F (36.7 C)  TempSrc:   Oral   SpO2:   100% 100%  Weight:  58.8 kg   Height:        Intake/Output Summary (Last 24 hours) at 08/28/2020 1623 Last data filed at 08/28/2020 1210 Gross per 24 hour  Intake -  Output 1000 ml  Net -1000 ml   Filed Weights   08/28/20 0840 08/28/20 1210  Weight: 59.2 kg 58.8 kg    Examination: General: Chronically sick looking gentleman.  Anxious today and in mild pain. Cardiovascular:  S1-S2 normal. Respiratory: Bilateral clear. Gastrointestinal: Soft.  Generalized tenderness along the right upper quadrant and epigastrium with no rigidity or guarding.  Bowel sounds present. Ext: Generalized weakness. Neuro: No focal deficits.     Data Reviewed: I have personally reviewed following labs and imaging studies  CBC: Recent Labs  Lab 08/23/20 1459 08/24/20 1511 08/25/20 1006 08/25/20 1413 08/26/20 0649 08/27/20 0545 08/28/20 0348  WBC 13.1*   < > 30.9* 31.8* 35.7* 27.3* 29.0*  NEUTROABS 10.3*  --   --   --  33.2* 24.2* 25.3*  HGB 8.9*   < > 8.5* 8.1* 8.6* 7.8* 8.7*  HCT 26.5*   < > 24.8* 24.3* 24.3* 23.4* 25.3*  MCV 89.1   < > 87.9 89.0 85.6 88.6 89.1  PLT 447.0*   < > 359 342 323 361 431*   < > = values in this interval not displayed.   Basic Metabolic Panel: Recent Labs  Lab 08/24/20 1511 08/25/20 1006 08/25/20 1413 08/26/20 0649 08/28/20 0707  NA 133* 133* 133*  --  133*  K 4.7 5.5* 5.3*  --  4.6  CL 91* 97* 96*  --  96*  CO2 25 22 19*  --  23  GLUCOSE 87 76 74  --  91  BUN 45* 52* 55*  --  49*  CREATININE 9.08* 9.59* 9.76*  --  7.81*  CALCIUM 7.9* 7.2* 7.5*  --  8.0*  MG  --   --   --  1.8  --   PHOS  --   --  5.9*  --  5.1*   GFR: Estimated Creatinine Clearance: 7 mL/min (A) (by C-G formula based on SCr of 7.81 mg/dL (H)). Liver Function Tests: Recent Labs  Lab 08/24/20 1511 08/25/20 1006 08/25/20 1413 08/28/20 0707  AST 15 12*  --   --   ALT 9 9  --   --   ALKPHOS 99 82  --   --   BILITOT 0.7 0.9  --   --   PROT 7.1 5.2*  --   --   ALBUMIN 3.3* 2.3* 2.3* 2.4*   No results for input(s): LIPASE, AMYLASE in the last 168 hours. No results for input(s): AMMONIA in the last 168 hours. Coagulation Profile: Recent Labs  Lab 08/24/20 1536 08/25/20 1006 08/26/20 0649  INR 8.8* 2.6* 1.8*   Cardiac Enzymes: No results for input(s): CKTOTAL, CKMB, CKMBINDEX, TROPONINI in the last 168 hours. BNP (last 3 results) No results for  input(s): PROBNP in the last 8760 hours. HbA1C: No results for input(s): HGBA1C in the last 72 hours. CBG: No results for input(s): GLUCAP in the last 168 hours. Lipid Profile: No results for input(s): CHOL, HDL, LDLCALC, TRIG, CHOLHDL, LDLDIRECT in the last 72 hours. Thyroid Function Tests: No results for input(s): TSH, T4TOTAL, FREET4, T3FREE, THYROIDAB in the last 72 hours. Anemia Panel: No results for input(s): VITAMINB12, FOLATE, FERRITIN, TIBC, IRON, RETICCTPCT in the last 72 hours. Sepsis Labs: No results for input(s): PROCALCITON, LATICACIDVEN in the last 168 hours.  Recent Results (from  the past 240 hour(s))  Ova and parasite examination     Status: None   Collection Time: 08/23/20  3:21 PM   Specimen: Stool  Result Value Ref Range Status   MICRO NUMBER: [id number]  Final   SPECIMEN QUALITY: Adequate  Final   Source STOOL  Final   STATUS: FINAL  Final   CONCENTRATE RESULT: No ova or parasites seen  Final   TRICHROME RESULT: No ova or parasites seen  Final   COMMENT:   Final    Routine Ova and Parasite exam may not detect some parasites that occasionally cause diarrheal illness. Cryptosporidium Antigen and/or Cyclospora and Isospora Exam may be ordered to detect these parasites. One negative sample does not necessarily rule out  the presence of a parasitic infection.  For additional information, please refer to https://education.questdiagnostics.com/faq/FAQ203 (This link is being provided for informational/ educational purposes only.)   Resp Panel by RT-PCR (Flu A&B, Covid) Nasopharyngeal Swab     Status: None   Collection Time: 08/24/20  3:40 PM   Specimen: Nasopharyngeal Swab; Nasopharyngeal(NP) swabs in vial transport medium  Result Value Ref Range Status   SARS Coronavirus 2 by RT PCR NEGATIVE NEGATIVE Final    Comment: (NOTE) SARS-CoV-2 target nucleic acids are NOT DETECTED.  The SARS-CoV-2 RNA is generally detectable in upper respiratory specimens during the acute  phase of infection. The lowest concentration of SARS-CoV-2 viral copies this assay can detect is 138 copies/mL. A negative result does not preclude SARS-Cov-2 infection and should not be used as the sole basis for treatment or other patient management decisions. A negative result may occur with  improper specimen collection/handling, submission of specimen other than nasopharyngeal swab, presence of viral mutation(s) within the areas targeted by this assay, and inadequate number of viral copies(<138 copies/mL). A negative result must be combined with clinical observations, patient history, and epidemiological information. The expected result is Negative.  Fact Sheet for Patients:  EntrepreneurPulse.com.au  Fact Sheet for Healthcare Providers:  IncredibleEmployment.be  This test is no t yet approved or cleared by the [place] FDA and  has been authorized for detection and/or diagnosis of SARS-CoV-2 by FDA under an Emergency Use Authorization (EUA). This EUA will remain  in effect (meaning this test can be used) for the duration of the COVID-19 declaration under Section 564(b)(1) of the Act, 21 U.S.C.section 360bbb-3(b)(1), unless the authorization is terminated  or revoked sooner.       Influenza A by PCR NEGATIVE NEGATIVE Final   Influenza B by PCR NEGATIVE NEGATIVE Final    Comment: (NOTE) The Xpert Xpress SARS-CoV-2/FLU/RSV plus assay is intended as an aid in the diagnosis of influenza from Nasopharyngeal swab specimens and should not be used as a sole basis for treatment. Nasal washings and aspirates are unacceptable for Xpert Xpress SARS-CoV-2/FLU/RSV testing.  Fact Sheet for Patients: EntrepreneurPulse.com.au  Fact Sheet for Healthcare Providers: IncredibleEmployment.be  This test is not yet approved or cleared by the [place] FDA and has been authorized for detection and/or diagnosis of  SARS-CoV-2 by FDA under an Emergency Use Authorization (EUA). This EUA will remain in effect (meaning this test can be used) for the duration of the COVID-19 declaration under Section 564(b)(1) of the Act, 21 U.S.C. section 360bbb-3(b)(1), unless the authorization is terminated or revoked.  Performed at [facility], [place] [address] [place], [place] [address]   MRSA PCR Screening     Status: None   Collection Time: 08/26/20  8:05 AM  Specimen: Nasal Mucosa; Nasopharyngeal  Result Value Ref Range Status   MRSA by PCR NEGATIVE NEGATIVE Final    Comment:        The GeneXpert MRSA Assay (FDA approved for NASAL specimens only), is one component of a comprehensive MRSA colonization surveillance program. It is not intended to diagnose MRSA infection nor to guide or monitor treatment for MRSA infections. Performed at [facility], [place] [address], [place], [place] [address]          Radiology Studies: No results found.      Scheduled Meds: . Chlorhexidine Gluconate Cloth  6 each Topical Q0600  . darbepoetin (ARANESP) injection - DIALYSIS  60 mcg Intravenous Q Sat-HD  . doxercalciferol  7 mcg Intravenous Q T,Th,Sa-HD  . pantoprazole  40 mg Oral BID  . saccharomyces boulardii  250 mg Oral BID  . vancomycin  500 mg Oral QID   Continuous Infusions:    LOS: 4 days    Time spent: 30 minutes    Barb Merino, MD [facility] Pager [phone]

## 2020-08-28 NOTE — Plan of Care (Signed)

## 2020-08-28 NOTE — Progress Notes (Signed)
[facility] Progress Note   Subjective:   Patient seen and examined at bedside in dialysis pre treatment.  Reports n/v/d and abdominal pain this AM.  Denies CP, SOB, fever and chills.    Objective Vitals:   08/28/20 0930 08/28/20 1000 08/28/20 1030 08/28/20 1100  BP: (!) 150/79 (!) 148/78 (!) 143/78 (!) 143/70  Pulse:      Resp: 13 13 13 13   Temp:      TempSrc:      SpO2:      Weight:      Height:       Physical Exam General: chronically ill appearing, elderly male in NAD Heart:RRR, no mrg apprecaited Lungs:CTAB anterolaterally, nml WOB on RA Abdomen:soft, mildly distended, +tenderness to palpation Extremities:no LE edema Dialysis Access: LU AVF +b/t   Filed Weights   08/28/20 0840  Weight: 59.2 kg   No intake or output data in the 24 hours ending 08/28/20 1152  Additional Objective Labs: Basic Metabolic Panel: Recent Labs  Lab 08/25/20 1006 08/25/20 1413 08/28/20 0707  NA 133* 133* 133*  K 5.5* 5.3* 4.6  CL 97* 96* 96*  CO2 22 19* 23  GLUCOSE 76 74 91  BUN 52* 55* 49*  CREATININE 9.59* 9.76* 7.81*  CALCIUM 7.2* 7.5* 8.0*  PHOS  --  5.9* 5.1*   Liver Function Tests: Recent Labs  Lab 08/24/20 1511 08/25/20 1006 08/25/20 1413 08/28/20 0707  AST 15 12*  --   --   ALT 9 9  --   --   ALKPHOS 99 82  --   --   BILITOT 0.7 0.9  --   --   PROT 7.1 5.2*  --   --   ALBUMIN 3.3* 2.3* 2.3* 2.4*   CBC: Recent Labs  Lab 08/25/20 1006 08/25/20 1413 08/26/20 0649 08/27/20 0545 08/28/20 0348  WBC 30.9* 31.8* 35.7* 27.3* 29.0*  NEUTROABS  --   --  33.2* 24.2* 25.3*  HGB 8.5* 8.1* 8.6* 7.8* 8.7*  HCT 24.8* 24.3* 24.3* 23.4* 25.3*  MCV 87.9 89.0 85.6 88.6 89.1  PLT 359 342 323 361 431*   Blood Culture    Component Value Date/Time   SDES IN/OUT CATH URINE 08/17/2020 1234   SPECREQUEST  08/17/2020 1234    NONE Performed at [facility], [address] [place], [place] [address]    CULT 4,000 COLONIES/mL ENTEROCOCCUS GALLINARUM (A)  08/17/2020 1234   REPTSTATUS 08/21/2020 FINAL 08/17/2020 1234     Medications: . [START ON 08/29/2020] sodium chloride    . [START ON 08/29/2020] sodium chloride     . Chlorhexidine Gluconate Cloth  6 each Topical Q0600  . darbepoetin (ARANESP) injection - DIALYSIS  60 mcg Intravenous Q Sat-HD  . doxercalciferol      . doxercalciferol  7 mcg Intravenous Q T,Th,Sa-HD  . pantoprazole  40 mg Oral BID  . saccharomyces boulardii  250 mg Oral BID  . vancomycin  500 mg Oral QID    Dialysis Orders: [facility] TTS 4h 450/500 58kg 3K/2Ca UFP2 L AVF No heparin  Mircera 50 (to start 2/22)  Venofer 50  Hectorol 7   Assessment/Plan: 1. BRBPR/ABLA  Hx GI bleed/rectal ca, supra therapeutic INR on admit (during previous admission EGD2/9with duodenal ulcer, treated w/ clip+ injection)On Protonix.  S/p 2 u prbcs 2/19. Hgb up and down, increased to 8.7 today.  Per Primary/GI. 2. ESRD - HD TTS. HD today per regular schedule.  K 4.6. 3. C. Diff colitis. On  PO Vanc/Florastor.  Plan for OP CTA abdomen to assess ofr nonocclusive mesenteric ischemia.   4. AFib- on coumadin. Supratherapeutic INR on admission.  Reversed with Vit K. Coumadin held.  5. HTNVolume - BP elevated this AM but improved with HD.  Does not appear volume overloaded. UF to EDW as tolerated. 6. Anemia CKD -- see #1.  Hgb 8.7, continue aranesp 60qwk.  7. MBD CKD --Corr Ca and phos ok. Continue home binders when taking PO.  Hold Sensipar for now.  8. COPD  9. H/o prior rectal cancer - in 2014 treated w/ chemoRx,radiation, laparoscopic low anterior resection with diverting loop ileostomy and subsequent ileostomy takedown.   Jen Mow, PA-C [facility] 08/28/2020,11:52 AM  LOS: 4 days

## 2020-08-28 NOTE — Progress Notes (Addendum)
Daily Rounding Note  08/28/2020, 9:18 AM  LOS: 4 days   SUBJECTIVE:   Chief complaint: Bloody diarrhea.  Still having right abdominal pain.  Bloody stools persist had 2 overnight and 1 this morning.  Overall feels better.  Currently undergoing HD.  OBJECTIVE:         Vital signs in last 24 hours:    Temp:  [97.7 F (36.5 C)-98.5 F (36.9 C)] 98.5 F (36.9 C) (02/22 0840) Pulse Rate:  [100-107] 100 (02/22 0435) Resp:  [15-20] 15 (02/22 0900) BP: (113-165)/(61-82) 148/78 (02/22 0900) SpO2:  [92 %-100 %] 100 % (02/22 0840) Weight:  [59.2 kg] 59.2 kg (02/22 0840) Last BM Date: 08/28/20 Filed Weights   08/28/20 0840  Weight: 59.2 kg   General: Comfortable, NAD.  Does not look toxic Heart: RRR. Chest: No labored breathing or cough. Abdomen: Soft, tender diffusely but more pronounced on the right abdomen.  No guarding or rebound.  Bowel sounds active. Extremities: No CCE. Neuro/Psych: Alert.  Oriented x3.  No tremors, moves all 4 limbs.  Intake/Output from previous day: No intake/output data recorded.  Intake/Output this shift: No intake/output data recorded.  Lab Results: Recent Labs    08/26/20 0649 08/27/20 0545 08/28/20 0348  WBC 35.7* 27.3* 29.0*  HGB 8.6* 7.8* 8.7*  HCT 24.3* 23.4* 25.3*  PLT 323 361 431*   BMET Recent Labs    08/25/20 1006 08/25/20 1413 08/28/20 0707  NA 133* 133* 133*  K 5.5* 5.3* 4.6  CL 97* 96* 96*  CO2 22 19* 23  GLUCOSE 76 74 91  BUN 52* 55* 49*  CREATININE 9.59* 9.76* 7.81*  CALCIUM 7.2* 7.5* 8.0*   LFT Recent Labs    08/25/20 1006 08/25/20 1413 08/28/20 0707  PROT 5.2*  --   --   ALBUMIN 2.3* 2.3* 2.4*  AST 12*  --   --   ALT 9  --   --   ALKPHOS 82  --   --   BILITOT 0.9  --   --    PT/INR Recent Labs    08/25/20 1006 08/26/20 0649  LABPROT 27.4* 20.3*  INR 2.6* 1.8*   Hepatitis Panel No results for input(s): HEPBSAG, HCVAB, HEPAIGM, HEPBIGM in  the last 72 hours.  Studies/Results: No results found.   Scheduled Meds: . sodium chloride   Intravenous Once  . Chlorhexidine Gluconate Cloth  6 each Topical Q0600  . darbepoetin (ARANESP) injection - DIALYSIS  60 mcg Intravenous Q Sat-HD  . doxercalciferol  7 mcg Intravenous Q T,Th,Sa-HD  . pantoprazole (PROTONIX) IV  40 mg Intravenous Q24H  . saccharomyces boulardii  250 mg Oral BID  . vancomycin  500 mg Oral QID   Continuous Infusions: . [START ON 08/29/2020] sodium chloride    . [START ON 08/29/2020] sodium chloride     PRN Meds:.[START ON 08/29/2020] sodium chloride, [START ON 08/29/2020] sodium chloride, acetaminophen **OR** acetaminophen, [START ON 08/29/2020] alteplase, fentaNYL (SUBLIMAZE) injection, [START ON 08/29/2020] heparin, hyoscyamine, [START ON 08/29/2020] lidocaine (PF), [START ON 08/29/2020] lidocaine-prilocaine, ondansetron **OR** ondansetron (ZOFRAN) IV, [START ON 08/29/2020] pentafluoroprop-tetrafluoroeth   ASSESMENT:   *    C. Difficile.   Background of chronic and non-C. diff diarrhea. CT scan showing inflammatory changes in the sigmoid. Day 4 Vancomycin 125 mg qid >> then 500  mg po qid day.  Day 4 Florastor.   WBCs 35 >> 27 K. >> 29 K.    *  Rectal bleeding, hematochezia in setting of coagulopathy (INR >> 8)  *    Coagulopathy, corrected after IV/po Vit K.. A fib.  Coumadin on hold  *    Acute on chronic anemia.  Aranesp in place.  Previous infusions of Feraheme.    *    Recent upper GI bleed. 08/20/2020 EGD with duodenal ulcer w VV, treated with injection and clip.  *    Rectal cancer. LAR, diverting loop ileostomy 05/2013 neoadjuvant therapy. 11/2019 latest colonoscopy with TA, no HGD.  *    ESRD. HD TTS.    PLAN   *  outpt CTA abdomen had been ordered by Dr. Loletha Carrow to assess for nonocclusive mesenteric ischemia which could be the cause of his chronic diarrhea and malabsorption. Plan to perform this closer to discharge.     *   Switch to oral  Protonix 40 mg po bid through 3/14 then drop to 40 mg/daily.       Azucena Freed  08/28/2020, 9:18 AM Phone [phone]  Attestation signed by Lavena Bullion, DO at 08/28/2020 3:01 PM     Attending physician's note     I have taken an interval history, reviewed the chart and discussed his care on rounds. I agree with the Advanced Practitioner's note, impression, and recommendations as outlined.  Plan for CTA later on this admission as outlined.    [address] DO, FACG  [phone] office

## 2020-08-28 NOTE — Progress Notes (Signed)
PT Cancellation Note  Patient Details Name: Jason RUSSETT Sr. MRN: [id number] DOB: 1946/04/07   Cancelled Treatment:    Reason Eval/Treat Not Completed: Patient at procedure or test/unavailable (HD). Will follow-up for PT Evaluation as schedule permits.  Mabeline Caras, PT, DPT Acute Rehabilitation Services  Pager [phone] Office [place] 08/28/2020, 9:05 AM

## 2020-08-28 NOTE — Progress Notes (Signed)
OT Cancellation Note  Patient Details Name: Jason BINION Sr. MRN: [id number] DOB: 03-Nov-1945   Cancelled Treatment:    Reason Eval/Treat Not Completed: Patient at procedure or test/ unavailable. Pt off unit at HD, OT will follow up next available time  Britt Bottom 08/28/2020, 9:16 AM

## 2020-08-29 ENCOUNTER — Other Ambulatory Visit: Payer: Self-pay

## 2020-08-29 ENCOUNTER — Encounter (HOSPITAL_COMMUNITY): Payer: Self-pay | Admitting: Internal Medicine

## 2020-08-29 ENCOUNTER — Telehealth: Payer: Medicare Other

## 2020-08-29 ENCOUNTER — Ambulatory Visit (HOSPITAL_COMMUNITY): Admission: RE | Admit: 2020-08-29 | Payer: Medicare Other | Source: Ambulatory Visit

## 2020-08-29 ENCOUNTER — Inpatient Hospital Stay (HOSPITAL_COMMUNITY): Payer: Medicare Other

## 2020-08-29 DIAGNOSIS — K921 Melena: Secondary | ICD-10-CM | POA: Diagnosis not present

## 2020-08-29 DIAGNOSIS — A0472 Enterocolitis due to Clostridium difficile, not specified as recurrent: Secondary | ICD-10-CM | POA: Diagnosis not present

## 2020-08-29 LAB — CBC WITH DIFFERENTIAL/PLATELET
Abs Immature Granulocytes: 0.21 10*3/uL — ABNORMAL HIGH (ref 0.00–0.07)
Basophils Absolute: 0 10*3/uL (ref 0.0–0.1)
Basophils Relative: 0 %
Eosinophils Absolute: 0 10*3/uL (ref 0.0–0.5)
Eosinophils Relative: 0 %
HCT: 23.1 % — ABNORMAL LOW (ref 39.0–52.0)
Hemoglobin: 7.8 g/dL — ABNORMAL LOW (ref 13.0–17.0)
Immature Granulocytes: 1 %
Lymphocytes Relative: 2 %
Lymphs Abs: 0.6 10*3/uL — ABNORMAL LOW (ref 0.7–4.0)
MCH: 30.2 pg (ref 26.0–34.0)
MCHC: 33.8 g/dL (ref 30.0–36.0)
MCV: 89.5 fL (ref 80.0–100.0)
Monocytes Absolute: 1.6 10*3/uL — ABNORMAL HIGH (ref 0.1–1.0)
Monocytes Relative: 6 %
Neutro Abs: 24.9 10*3/uL — ABNORMAL HIGH (ref 1.7–7.7)
Neutrophils Relative %: 91 %
Platelets: 417 10*3/uL — ABNORMAL HIGH (ref 150–400)
RBC: 2.58 MIL/uL — ABNORMAL LOW (ref 4.22–5.81)
RDW: 17.5 % — ABNORMAL HIGH (ref 11.5–15.5)
WBC: 27.3 10*3/uL — ABNORMAL HIGH (ref 4.0–10.5)
nRBC: 0 % (ref 0.0–0.2)

## 2020-08-29 LAB — GLUCOSE, CAPILLARY: Glucose-Capillary: 92 mg/dL (ref 70–99)

## 2020-08-29 MED ORDER — ONDANSETRON HCL 4 MG/2ML IJ SOLN
4.0000 mg | Freq: Four times a day (QID) | INTRAMUSCULAR | Status: DC | PRN
  Administered 2020-08-30 – 2020-09-11 (×12): 4 mg via INTRAVENOUS
  Filled 2020-08-29 (×13): qty 2

## 2020-08-29 MED ORDER — IOHEXOL 350 MG/ML SOLN
75.0000 mL | Freq: Once | INTRAVENOUS | Status: AC | PRN
  Administered 2020-08-29: 75 mL via INTRAVENOUS

## 2020-08-29 MED ORDER — NIACIN 100 MG PO TABS
50.0000 mg | ORAL_TABLET | Freq: Three times a day (TID) | ORAL | Status: DC
  Administered 2020-08-29 – 2020-09-04 (×9): 50 mg via ORAL
  Filled 2020-08-29 (×26): qty 1

## 2020-08-29 MED ORDER — CHLORHEXIDINE GLUCONATE CLOTH 2 % EX PADS
6.0000 | MEDICATED_PAD | Freq: Every day | CUTANEOUS | Status: DC
  Administered 2020-09-01 – 2020-09-02 (×2): 6 via TOPICAL

## 2020-08-29 NOTE — Evaluation (Signed)
Occupational Therapy Evaluation Patient Details Name: Jason MUZQUIZ Sr. MRN: [id number] DOB: 08/04/1945 Today's Date: 08/29/2020    History of Present Illness Pt is a 75 y.o. male recently admitted 08/15/20-08/19/20 with upper GIB and duodenal ulcer, now readmitted 08/24/20 with C. difficile collitis, lower GIB. PMH includes afib on Coumadin, peptic ulcer disease, rectal CA, ESRD (HD TTS), CVA, gout, COPD, s/p partial lung lobectomy (2017).   Clinical Impression   Pt presents with decline in function and safety with ADLs and ADL mobility with impaired strength, balance and endurance. PTA, pt lived at home with his wife and reports that he uses a RW and was Ind with ADLs/selfacre  And pt currently requires mod A with LB ADLs, toileting and min A with mobility using RW. Pt fatigues easily. Pt would benefit form acute OT services to address impairments to maximize level of function and safety    Follow Up Recommendations  SNF    Equipment Recommendations  3 in 1 bedside commode    Recommendations for Other Services       Precautions / Restrictions Precautions Precautions: Fall Precaution Comments: per last admission, patient WBAT R LE in CAM boot. Not in room on date of eval Restrictions Weight Bearing Restrictions: Yes RLE Weight Bearing: Weight bearing as tolerated      Mobility Bed Mobility Overal bed mobility: Needs Assistance Bed Mobility: Supine to Sit     Supine to sit: Min guard     General bed mobility comments: used therapist arm as brace to pull up on, otherwise no physical assist. Reports of lightheadedness upon sitting    Transfers Overall transfer level: Needs assistance Equipment used: Rolling walker (2 wheeled) Transfers: Sit to/from Stand Sit to Stand: Min assist         General transfer comment: Reports of dizziness and lightheadedness in standing    Balance Overall balance assessment: Needs assistance Sitting-balance support: Feet supported;No upper  extremity supported Sitting balance-Leahy Scale: Fair     Standing balance support: Bilateral upper extremity supported;During functional activity Standing balance-Leahy Scale: Poor                             ADL either performed or assessed with clinical judgement   ADL Overall ADL's : Needs assistance/impaired Eating/Feeding: Set up;Independent;Sitting   Grooming: Wash/dry hands;Wash/dry face;Min guard;Sitting   Upper Body Bathing: Min guard;Sitting   Lower Body Bathing: Moderate assistance   Upper Body Dressing : Min guard;Sitting   Lower Body Dressing: Moderate assistance   Toilet Transfer: Minimal assistance;RW;BSC   Toileting- Clothing Manipulation and Hygiene: Maximal assistance;Moderate assistance       Functional mobility during ADLs: Minimal assistance;Rolling walker General ADL Comments: pt beacme fatigued after doffing dirty socks and donning one sock     Vision Baseline Vision/History: Wears glasses Patient Visual Report: No change from baseline       Perception     Praxis      Pertinent Vitals/Pain Pain Assessment: 0-10 Pain Score: 5  Pain Location: abdomen Pain Descriptors / Indicators: Pressure;Sore Pain Intervention(s): Monitored during session;Repositioned     Hand Dominance Right   Extremity/Trunk Assessment Upper Extremity Assessment Upper Extremity Assessment: Generalized weakness   Lower Extremity Assessment Lower Extremity Assessment: Defer to PT evaluation   Cervical / Trunk Assessment Cervical / Trunk Assessment: Normal   Communication Communication Communication: No difficulties   Cognition Arousal/Alertness: Awake/alert Behavior During Therapy: Flat affect Overall Cognitive Status: Within Functional Limits  for tasks assessed                                     General Comments       Exercises     Shoulder Instructions      Home Living Family/patient expects to be discharged to::  Private residence Living Arrangements: Spouse/significant other Available Help at Discharge: Family;Available 24 hours/day Type of Home: House Home Access: Level entry     Home Layout: One level     Bathroom Shower/Tub: Teacher, early years/pre: Standard Bathroom Accessibility: Yes   Home Equipment: Environmental consultant - 2 wheels          Prior Functioning/Environment Level of Independence: Independent with assistive device(s)        Comments: reports that he uses RW to go to HD and sometimes in the house, reports that he was Ind with ADLs/selfcare        OT Problem List: Decreased strength;Impaired balance (sitting and/or standing);Pain;Decreased coordination;Decreased activity tolerance;Decreased knowledge of use of DME or AE      OT Treatment/Interventions: Self-care/ADL training;DME and/or AE instruction;Therapeutic activities;Balance training;Therapeutic exercise;Patient/family education;Energy conservation    OT Goals(Current goals can be found in the care plan section) Acute Rehab OT Goals Patient Stated Goal: to go to rehab before returning home OT Goal Formulation: With patient/family Time For Goal Achievement: 09/12/20 Potential to Achieve Goals: Good ADL Goals Pt Will Perform Grooming: with min guard assist;standing Pt Will Perform Upper Body Bathing: with supervision;with set-up;sitting Pt Will Perform Lower Body Bathing: with mod assist;sitting/lateral leans;sit to/from stand Pt Will Perform Upper Body Dressing: with supervision;with set-up;sitting Pt Will Transfer to Toilet: with min guard assist;with supervision;ambulating;regular height toilet;bedside commode Pt Will Perform Toileting - Clothing Manipulation and hygiene: with min assist;sit to/from stand  OT Frequency: Min 2X/week   Barriers to D/C:            Co-evaluation              AM-PAC OT "6 Clicks" Daily Activity     Outcome Measure Help from another person eating meals?: None Help  from another person taking care of personal grooming?: A Little Help from another person toileting, which includes using toliet, bedpan, or urinal?: A Lot Help from another person bathing (including washing, rinsing, drying)?: A Lot Help from another person to put on and taking off regular upper body clothing?: A Little Help from another person to put on and taking off regular lower body clothing?: A Lot 6 Click Score: 16   End of Session Equipment Utilized During Treatment: Gait belt;Rolling walker;Other (comment) (BSC)  Activity Tolerance: Patient limited by fatigue Patient left: in chair;with call bell/phone within reach;with chair alarm set  OT Visit Diagnosis: Unsteadiness on feet (R26.81);Other abnormalities of gait and mobility (R26.89);Muscle weakness (generalized) (M62.81);Pain Pain - part of body:  (abdomen)                Time: 0160-1093 OT Time Calculation (min): 30 min Charges:  OT General Charges $OT Visit: 1 Visit OT Evaluation $OT Eval Moderate Complexity: 1 Mod    Britt Bottom 08/29/2020, 2:14 PM

## 2020-08-29 NOTE — Progress Notes (Signed)
PROGRESS NOTE    Jason HALLENBECK Sr.  ELF:[id number] DOB: Jul 26, 1945 DOA: 08/24/2020 PCP: [name], MD    Brief Narrative:  75 year old gentleman with history of chronic A. fib on Coumadin, peptic ulcer disease, ESRD on hemodialysis TTS schedule who presented to the ER with abdominal cramping, nausea vomiting diarrhea and rectal bleeding.  Patient was recently in the hospital 2/9-2/13 with upper GI bleeding and found to have bleeding duodenal ulcer treated with epinephrine injection and clipping.  Discharged on Protonix twice daily.Patient also has chronic diarrhea.  He has history of rectal cancer with resection.  Admitted with rectal bleeding, C. difficile infection and diarrhea and anemia.  Patient was hemodynamically stable in the ER. Admitted with C. diff, rectal bleeding. Followed by GI.  Assessment & Plan:   Active Problems:   Hematochezia   GIB (gastrointestinal bleeding)   C. difficile colitis  C. difficile colitis with diarrhea and abdominal pain: First episode.  CT scan with evidence of proctitis.  Patient has history of low anterior resection as well as ileostomy and reversal.  Recent colonoscopies were significantly normal with evidence of diverticulosis. With worsening WBC count and abdominal pain, treated with higher dose of oral vancomycin.  Persistent pain, plans for CTA as per GI. Diet as tolerated. Today with worsening nausea and abdominal pain.  Symptomatic treatment. Will add Phenergan, Zofran and low-dose Dilaudid as patient is dialysis patient. Advised him to eat liquid diet and solid if he feels better.  Lower GI bleeding, painless rectal bleeding: Anemia of acute blood loss. Suspect outlet bleeding from proctitis/radiation injury with ongoing diarrhea and supratherapeutic INR. Currently hemodynamically stabilizing. Supratherapeutic INR, presented with 8.8-aggressively reversed with 2 units of FFP, IV vitamin K. Coumadin on hold. Hemoglobin 8.9-7.6-6.8-1 unit  of transfusion-8.1-1 unit of transfusion 8.6. Received total 2 units of PRBC and is stabilizing. Followed by GI, anticipate conservative management with active C. difficile infection. On oral Protonix for upper GI bleeding. Clinically looks like his bleeding has been subsiding.  Acquired thrombophilia, chronic A. fib on Coumadin with supratherapeutic INR: Presented with INR of 8.8, on Coumadin and probably supratherapeutic due to acute illnesses. Aggressively reversed. Continue to hold Coumadin, no need for further reversal. A. fib is rate controlled.  ESRD on hemodialysis: Stable.  Getting dialysis on his schedule.  Suspected B3 deficiency: Patient recently had vitamin B3 level checked, niacin level 30.  Does not have other evidence of niacin deficiency.  We will put patient on trial of niacin replacement.  No contraindications.  Patient is stabilizing.  Start working with therapies. May need SNF at discharge.  DVT prophylaxis: SCDs Start: 08/24/20 2254   Code Status: Full code Family Communication: [name] at the bedside. Disposition Plan: Status is: Inpatient  Remains inpatient appropriate because:Persistent severe electrolyte disturbances and IV treatments appropriate due to intensity of illness or inability to take PO   Dispo: The patient is from: Home              Anticipated d/c is to: SNF              Anticipated d/c date is: 2 days              Patient currently is not medically stable to d/c.   Difficult to place patient No  Consultants:   Gastroenterology  Nephrology  Procedures:   Routine dialysis  Antimicrobials:   Vancomycin by mouth 2/18---   Subjective: Patient seen and examined.  Continues to have intermittent emesis overnight.  He just vomited early morning.  Feels he has dull upper abdominal pain 6/10.  Stool frequency and volume have decreased.  Last bowel movement with no blood reported.  Wife at the bedside. He is very much bothered  today with nausea and vomiting.   Objective: Vitals:   08/28/20 1210 08/28/20 1314 08/28/20 2033 08/29/20 0825  BP: (!) 153/80 139/60 (!) 157/79 (!) 150/86  Pulse:  100 100 (!) 110  Resp: 14 18 16 18   Temp: 98 F (36.7 C) 98 F (36.7 C) 100.2 F (37.9 C) 98 F (36.7 C)  TempSrc: Oral  Oral Oral  SpO2: 100% 100% 100% 93%  Weight: 58.8 kg     Height:        Intake/Output Summary (Last 24 hours) at 08/29/2020 1110 Last data filed at 08/28/2020 1210 Gross per 24 hour  Intake --  Output 1000 ml  Net -1000 ml   Filed Weights   08/28/20 0840 08/28/20 1210  Weight: 59.2 kg 58.8 kg    Examination: General: Chronically sick looking gentleman, is in mild distress and anxious. Cardiovascular: S1-S2 normal. Respiratory: Bilateral clear. Gastrointestinal: Soft.  Generalized tenderness along the right upper quadrant and epigastrium with no rigidity or guarding.  Bowel sounds present. Ext: Generalized weakness. Neuro: No focal deficits.     Data Reviewed: I have personally reviewed following labs and imaging studies  CBC: Recent Labs  Lab 08/23/20 1459 08/24/20 1511 08/25/20 1413 08/26/20 0649 08/27/20 0545 08/28/20 0348 08/29/20 0258  WBC 13.1*   < > 31.8* 35.7* 27.3* 29.0* 27.3*  NEUTROABS 10.3*  --   --  33.2* 24.2* 25.3* 24.9*  HGB 8.9*   < > 8.1* 8.6* 7.8* 8.7* 7.8*  HCT 26.5*   < > 24.3* 24.3* 23.4* 25.3* 23.1*  MCV 89.1   < > 89.0 85.6 88.6 89.1 89.5  PLT 447.0*   < > 342 323 361 431* 417*   < > = values in this interval not displayed.   Basic Metabolic Panel: Recent Labs  Lab 08/24/20 1511 08/25/20 1006 08/25/20 1413 08/26/20 0649 08/28/20 0707  NA 133* 133* 133*  --  133*  K 4.7 5.5* 5.3*  --  4.6  CL 91* 97* 96*  --  96*  CO2 25 22 19*  --  23  GLUCOSE 87 76 74  --  91  BUN 45* 52* 55*  --  49*  CREATININE 9.08* 9.59* 9.76*  --  7.81*  CALCIUM 7.9* 7.2* 7.5*  --  8.0*  MG  --   --   --  1.8  --   PHOS  --   --  5.9*  --  5.1*   GFR: Estimated  Creatinine Clearance: 7 mL/min (A) (by C-G formula based on SCr of 7.81 mg/dL (H)). Liver Function Tests: Recent Labs  Lab 08/24/20 1511 08/25/20 1006 08/25/20 1413 08/28/20 0707  AST 15 12*  --   --   ALT 9 9  --   --   ALKPHOS 99 82  --   --   BILITOT 0.7 0.9  --   --   PROT 7.1 5.2*  --   --   ALBUMIN 3.3* 2.3* 2.3* 2.4*   No results for input(s): LIPASE, AMYLASE in the last 168 hours. No results for input(s): AMMONIA in the last 168 hours. Coagulation Profile: Recent Labs  Lab 08/24/20 1536 08/25/20 1006 08/26/20 0649  INR 8.8* 2.6* 1.8*   Cardiac Enzymes: No results for input(s): CKTOTAL, CKMB,  CKMBINDEX, TROPONINI in the last 168 hours. BNP (last 3 results) No results for input(s): PROBNP in the last 8760 hours. HbA1C: No results for input(s): HGBA1C in the last 72 hours. CBG: Recent Labs  Lab 08/28/20 1629 08/29/20 0655  GLUCAP 80 92   Lipid Profile: No results for input(s): CHOL, HDL, LDLCALC, TRIG, CHOLHDL, LDLDIRECT in the last 72 hours. Thyroid Function Tests: No results for input(s): TSH, T4TOTAL, FREET4, T3FREE, THYROIDAB in the last 72 hours. Anemia Panel: No results for input(s): VITAMINB12, FOLATE, FERRITIN, TIBC, IRON, RETICCTPCT in the last 72 hours. Sepsis Labs: No results for input(s): PROCALCITON, LATICACIDVEN in the last 168 hours.  Recent Results (from the past 240 hour(s))  Ova and parasite examination     Status: None   Collection Time: 08/23/20  3:21 PM   Specimen: Stool  Result Value Ref Range Status   MICRO NUMBER: [id number]  Final   SPECIMEN QUALITY: Adequate  Final   Source STOOL  Final   STATUS: FINAL  Final   CONCENTRATE RESULT: No ova or parasites seen  Final   TRICHROME RESULT: No ova or parasites seen  Final   COMMENT:   Final    Routine Ova and Parasite exam may not detect some parasites that occasionally cause diarrheal illness. Cryptosporidium Antigen and/or Cyclospora and Isospora Exam may be ordered to detect these  parasites. One negative sample does not necessarily rule out  the presence of a parasitic infection.  For additional information, please refer to https://education.questdiagnostics.com/faq/FAQ203 (This link is being provided for informational/ educational purposes only.)   Resp Panel by RT-PCR (Flu A&B, Covid) Nasopharyngeal Swab     Status: None   Collection Time: 08/24/20  3:40 PM   Specimen: Nasopharyngeal Swab; Nasopharyngeal(NP) swabs in vial transport medium  Result Value Ref Range Status   SARS Coronavirus 2 by RT PCR NEGATIVE NEGATIVE Final    Comment: (NOTE) SARS-CoV-2 target nucleic acids are NOT DETECTED.  The SARS-CoV-2 RNA is generally detectable in upper respiratory specimens during the acute phase of infection. The lowest concentration of SARS-CoV-2 viral copies this assay can detect is 138 copies/mL. A negative result does not preclude SARS-Cov-2 infection and should not be used as the sole basis for treatment or other patient management decisions. A negative result may occur with  improper specimen collection/handling, submission of specimen other than nasopharyngeal swab, presence of viral mutation(s) within the areas targeted by this assay, and inadequate number of viral copies(<138 copies/mL). A negative result must be combined with clinical observations, patient history, and epidemiological information. The expected result is Negative.  Fact Sheet for Patients:  EntrepreneurPulse.com.au  Fact Sheet for Healthcare Providers:  IncredibleEmployment.be  This test is no t yet approved or cleared by the [place] FDA and  has been authorized for detection and/or diagnosis of SARS-CoV-2 by FDA under an Emergency Use Authorization (EUA). This EUA will remain  in effect (meaning this test can be used) for the duration of the COVID-19 declaration under Section 564(b)(1) of the Act, 21 U.S.C.section 360bbb-3(b)(1), unless the  authorization is terminated  or revoked sooner.       Influenza A by PCR NEGATIVE NEGATIVE Final   Influenza B by PCR NEGATIVE NEGATIVE Final    Comment: (NOTE) The Xpert Xpress SARS-CoV-2/FLU/RSV plus assay is intended as an aid in the diagnosis of influenza from Nasopharyngeal swab specimens and should not be used as a sole basis for treatment. Nasal washings and aspirates are unacceptable for Xpert Xpress SARS-CoV-2/FLU/RSV testing.  Fact Sheet for Patients: EntrepreneurPulse.com.au  Fact Sheet for Healthcare Providers: IncredibleEmployment.be  This test is not yet approved or cleared by the [place] FDA and has been authorized for detection and/or diagnosis of SARS-CoV-2 by FDA under an Emergency Use Authorization (EUA). This EUA will remain in effect (meaning this test can be used) for the duration of the COVID-19 declaration under Section 564(b)(1) of the Act, 21 U.S.C. section 360bbb-3(b)(1), unless the authorization is terminated or revoked.  Performed at [facility], [place] [address] [place], [place] [address]   MRSA PCR Screening     Status: None   Collection Time: 08/26/20  8:05 AM   Specimen: Nasal Mucosa; Nasopharyngeal  Result Value Ref Range Status   MRSA by PCR NEGATIVE NEGATIVE Final    Comment:        The GeneXpert MRSA Assay (FDA approved for NASAL specimens only), is one component of a comprehensive MRSA colonization surveillance program. It is not intended to diagnose MRSA infection nor to guide or monitor treatment for MRSA infections. Performed at [facility], [place] [address] [place], [place] [address]          Radiology Studies: No results found.      Scheduled Meds: . Chlorhexidine Gluconate Cloth  6 each Topical Q0600  . darbepoetin (ARANESP) injection - DIALYSIS  60 mcg Intravenous Q Sat-HD  . doxercalciferol  7 mcg Intravenous Q T,Th,Sa-HD  . niacin  50 mg  Oral TID WC  . pantoprazole  40 mg Oral BID  . saccharomyces boulardii  250 mg Oral BID  . vancomycin  500 mg Oral QID   Continuous Infusions:    LOS: 5 days    Time spent: 30 minutes    Barb Merino, MD [facility] Pager [phone]

## 2020-08-29 NOTE — Progress Notes (Signed)
Pt vomited 4 times today. Antiemetic given PRN, but still vomiting. He is not keeping anything down after he eats. Still complains of abdominal pain even after given pain medication. Pt also still having small amount of bloody stools.

## 2020-08-29 NOTE — Consult Note (Addendum)
   [facility] CM Inpatient Consult   08/29/2020  [place] 1946-05-29 [id number]   [facility] [ACO] Patient: [employer] Embedded: Active Addendum: Patient is less than 7 days readmission with extreme high score for unplanned readmission.  Patient was screened for [facility] services.  This patient is  in an Warden/ranger where he has been outreached for chronic care management Embedded Care Management team.  Plan: Notification sent to the [facility] team regarding progression and post hospital needs. Patient has an pending status from previous [place], will close this action since patient is active in the Embedded CCM.  Please contact for further questions,  Natividad Brood, RN BSN [facility] Liaison  [phone] business mobile phone Toll free office [phone]  Fax number: [phone] [email or web address]

## 2020-08-29 NOTE — Progress Notes (Addendum)
Attending physician's note   I have taken an interval history, reviewed the chart and examined the patient. I agree with the Advanced Practitioner's note, impression, and recommendations as outlined.  Unfortunately continues to have abdominal pain and nausea with nonbloody emesis.  Stools have improved and frequency and somewhat consistency.  No hematochezia.  He has had an extensive work-up as an outpatient for his chronic/recurrent abdominal pain and diarrhea.  I again reviewed those previous studies with the patient today, along with more recent work-up for acute exacerbation.  -Plan for CT angiography to evaluate for overlapping vascular pathology -Continue antimicrobial therapy for C. difficile -No bleeding overnight.  Did discuss that if bleeding recurs, may need to consider repeat upper endoscopy (recent bleeding DU with VV treated with injection clip on 2/14) and colonoscopy (history of rectal cancer s/p LAR and neoadjuvant chemoradiation; diverticulosis; C diff this admission; coagulopathy) -Previous evaluation includes somatostatin which was elevated in 03/2020 but subsequent CT dotatate in 05/2020 was unremarkable (no 5 HIAA due to ESRD).  Otherwise normal VIP, calcitonin, gastrin, pancreatic elastase, so very low suspicion for occult NET -Started on niacin for recent B3 deficiency.  This can rarely be caused by cystoisospora belli in an immunocompromised host.  This can be difficult to capture on testing, and is typically treated with TMP-SMX, but not sure about adding the antimicrobial regimen in this acute setting. -Agree with starting scheduled antiemetics.  Start with Zofran and can keep Phenergan prn breakthrough -GI service will continue to follow  Gerrit Heck, DO, FACG [phone] office                                                                     Daily Rounding Note  08/29/2020, 10:13 AM  LOS: 5 days   SUBJECTIVE:   Chief complaint: Bloody diarrhea.   Abdominal pain.  Patient still has diffuse abdominal pain.  Sick to his stomach last night and this morning, vomited up sensure last PM and bilious green emesis this AM.  Stools no longer bloody.  Had a couple of loose brown stools yesterday.  OBJECTIVE:         Vital signs in last 24 hours:    Temp:  [98 F (36.7 C)-100.2 F (37.9 C)] 98 F (36.7 C) (02/23 0825) Pulse Rate:  [100-110] 110 (02/23 0825) Resp:  [13-18] 18 (02/23 0825) BP: (139-157)/(60-86) 150/86 (02/23 0825) SpO2:  [93 %-100 %] 93 % (02/23 0825) Weight:  [58.8 kg] 58.8 kg (02/22 1210) Last BM Date: 08/28/20 Filed Weights   08/28/20 0840 08/28/20 1210  Weight: 59.2 kg 58.8 kg   General: Looks chronically ill, frail. Heart: RRR. Chest: No labored breathing or cough. Abdomen: Diffuse, nonfocal moderate tenderness.  No guarding or rebound. Extremities: No edema Neuro/Psych: Appropriate.  Not confused.  Laconic but appropriate answers to questions.  Intake/Output from previous day: 02/22 0701 - 02/23 0700 In: -  Out: 1000   Intake/Output this shift: No intake/output data recorded.  Lab Results: Recent Labs    08/27/20 0545 08/28/20 0348 08/29/20 0258  WBC 27.3* 29.0* 27.3*  HGB 7.8* 8.7* 7.8*  HCT 23.4* 25.3* 23.1*  PLT 361 431* 417*   BMET Recent Labs    08/28/20 0707  NA 133*  K  4.6  CL 96*  CO2 23  GLUCOSE 91  BUN 49*  CREATININE 7.81*  CALCIUM 8.0*   LFT Recent Labs    08/28/20 0707  ALBUMIN 2.4*   PT/INR No results for input(s): LABPROT, INR in the last 72 hours. Hepatitis Panel No results for input(s): HEPBSAG, HCVAB, HEPAIGM, HEPBIGM in the last 72 hours.  Studies/Results: No results found.   Scheduled Meds: . Chlorhexidine Gluconate Cloth  6 each Topical Q0600  . darbepoetin (ARANESP) injection - DIALYSIS  60 mcg Intravenous Q Sat-HD  . doxercalciferol  7 mcg Intravenous Q T,Th,Sa-HD  . niacin  50 mg Oral TID WC  . pantoprazole  40 mg Oral BID  . saccharomyces boulardii   250 mg Oral BID  . vancomycin  500 mg Oral QID   Continuous Infusions: PRN Meds:.acetaminophen **OR** acetaminophen, HYDROmorphone (DILAUDID) injection, hyoscyamine, promethazine   ASSESMENT:   *   Rectal bleeding, hematochezia in setting of INR > 8.  Bleeding persisted following correction of INR C. difficile colitis but background of chronic, non-C. diff diarrhea.  Inflammatory changes at sigmoid on CT. Rectal cancer, LAR, diverting loop ileostomy, neoadjuvant therapy 2014.  Closure of ileostomy 2015. 11/2019 colonoscopy.  Rectal stricture.  Diverticulosis.  Patent colorectal anastomosis with healthy mucosa.  12 mm polyp (TA w/o HGD) piecemeal removed from mid ascending colon, endoclips applied following polypectomy.  Otherwise normal examined colon.  Random colon biopsies normal.  No explanation for diarrhea. Day four high dose vancomycin  *   Recent upper GI bleed.  EGD 2/14 with duodenal ulcer w VV, injected and endoclipped. Non-bloody emesis in last 24 hours.  On Protonix 40 mg po bid.    *    ESRD.  *    Coagulopathy.  INR 1.8 as of 3 days ago.  Chronic Coumadin on hold.  Indication A. fib, history CVA.   PLAN   *   Per Dr Bryan Lemma.    *   May need to regress diet.  ? Add scheduled anti-nausea meds, currently prn Phenergan in place.      Azucena Freed  08/29/2020, 10:13 AM Phone [phone]

## 2020-08-29 NOTE — Care Management Important Message (Signed)
Important Message  Patient Details  Name: Jason [name]. MRN: [id number] Date of Birth: Dec 25, 1945   Medicare Important Message Given:  Yes - Important Message mailed due to current National Emergency    Verbal consent obtained due to current National Emergency  Relationship to patient: Self Contact Name: [place] [name] Call Date: 08/29/20  Time: 1433 Phone: [phone] Outcome: No Answer/Busy Important Message mailed to: Patient address on file    Delorse Lek 08/29/2020, 2:33 PM

## 2020-08-29 NOTE — NC FL2 (Signed)
[place] LEVEL OF CARE SCREENING TOOL     IDENTIFICATION  Patient Name: Jason CATO Sr. Birthdate: 09-19-1945 Sex: male Admission Date (Current Location): 08/24/2020  [facility] and [place] Number:  Herbalist and Address:  [facility] [place] [address] [place], [place] [address]      Provider Number: [id number]  Attending Physician Name and Address:  Barb Merino, MD  Relative Name and Phone Number:       Current Level of Care: Hospital Recommended Level of Care: [place] Prior Approval Number:    Date Approved/Denied:   PASRR Number: [id number]  Discharge Plan: SNF    Current Diagnoses: Patient Active Problem List   Diagnosis Date Noted  . C. difficile colitis   . GIB (gastrointestinal bleeding) 08/24/2020  . GI bleed 08/15/2020  . Acute blood loss anemia 08/15/2020  . Supratherapeutic INR 08/15/2020  . Fibula fracture 08/15/2020  . Hematochezia   . Acute gastric ulcer with hemorrhage   . Leg edema, right 08/13/2020  . Injury of right leg 08/13/2020  . Closed nondisplaced segmental fracture of shaft of right fibula 08/13/2020  . Cellulitis of right leg 08/06/2020  . Complication of vascular dialysis catheter 04/18/2020  . Other disorders of phosphorus metabolism 04/04/2020  . Allergy, unspecified, initial encounter 03/01/2020  . Anaphylactic shock, unspecified, initial encounter 03/01/2020  . Fever 01/16/2020  . Benign prostatic hyperplasia without lower urinary tract symptoms 12/12/2019  . Meralgia paraesthetica, right 08/12/2019  . 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 Gait disturbance, post-stroke 04/17/2019  . Iron deficiency anemia, unspecified 03/14/2019  . Chronic obstructive pulmonary disease, unspecified ([place]) 01/25/2019  . Coagulopathy ([place]) 01/25/2019  . Diarrhea, unspecified 01/25/2019  . Hypertensive chronic kidney disease with stage 5 chronic kidney  disease or end stage renal disease ([place]) 01/25/2019  . Diverticulosis of intestine, part unspecified, without perforation or abscess without bleeding 01/25/2019  . Other idiopathic peripheral autonomic neuropathy 01/25/2019  . Hypokalemia 01/25/2019  . Male erectile dysfunction, unspecified 01/25/2019  . Pain, unspecified 01/25/2019  . Primary generalized (osteo)arthritis 01/25/2019  . Pruritus, unspecified 01/25/2019  . Secondary hyperparathyroidism of renal origin ([place]) 01/25/2019  . Anticoagulated 12/31/2018  . Paroxysmal atrial fibrillation ([place]) 12/22/2018  . Longstanding persistent atrial fibrillation ([place]) 09/20/2018  . Functional diarrhea 07/14/2018  . Benign essential HTN   . ESRD on dialysis ([place]) 06/16/2018  . Mild malnutrition ([place]) 06/16/2018  . Stroke due to embolism ([place]) 06/16/2018  . Chronic HFrEF (heart failure with reduced ejection fraction) ([place])   . CVA (cerebral vascular accident) ([place]) 06/08/2018  . CHF (congestive heart failure) ([place]) 05/08/2018  . Hyperlipidemia LDL goal <70 09/23/2017  . OAB (overactive bladder) 01/09/2016  . SUI (stress urinary incontinence), male 01/09/2016  . Hypersomnia 10/30/2015  . Obesity (BMI 30.0-34.9) 10/30/2015  . Routine general medical examination at a health care facility 06/04/2015  . Medicare annual wellness visit, subsequent 06/04/2015  . Hereditary and idiopathic peripheral neuropathy 09/14/2014  . Rectal cancer ([place]) 02/01/2013  . Obstructive sleep apnea 07/29/2010  . GOUT 05/11/2007  . ERECTILE DYSFUNCTION, MILD 05/11/2007  . Essential hypertension 05/11/2007    Orientation RESPIRATION BLADDER Height & Weight     Self,Time,Situation,Place  O2 (2L [place] as needed) Incontinent Weight: 129 lb 10.1 oz (58.8 kg) Height:  5\' 8"  (172.7 cm)  BEHAVIORAL SYMPTOMS/MOOD NEUROLOGICAL BOWEL NUTRITION STATUS      Incontinent    AMBULATORY STATUS COMMUNICATION OF NEEDS Skin  Limited Assist Verbally                          Personal Care Assistance Level of Assistance  Bathing,Dressing Bathing Assistance: Limited assistance   Dressing Assistance: Limited assistance     Functional Limitations Info             SPECIAL CARE FACTORS FREQUENCY  PT (By licensed PT),OT (By licensed OT)                    Contractures Contractures Info: Not present    Additional Factors Info  Code Status Code Status Info: FULL CODE             Current Medications (08/29/2020):  This is the current hospital active medication list Current Facility-Administered Medications  Medication Dose Route Frequency Provider Last Rate Last Admin  . acetaminophen (TYLENOL) tablet 650 mg  650 mg Oral Q6H PRN Marylyn Ishihara, Tyrone A, DO   650 mg at 08/29/20 1208   Or  . acetaminophen (TYLENOL) suppository 650 mg  650 mg Rectal Q6H PRN Marylyn Ishihara, Tyrone A, DO      . Chlorhexidine Gluconate Cloth 2 % PADS 6 each  6 each Topical Q0600 Cherylann Ratel A, DO   6 each at 08/29/20 [phone]  . [START ON 08/30/2020] Chlorhexidine Gluconate Cloth 2 % PADS 6 each  6 each Topical Q0600 Penninger, [place], [place]      . Darbepoetin Alfa (ARANESP) injection 60 mcg  60 mcg Intravenous Q Sat-HD Lynnda Child, PA-C      . doxercalciferol (HECTOROL) injection 7 mcg  7 mcg Intravenous Q T,Th,Sa-HD Lynnda Child, PA-C   7 mcg at 08/28/20 1055  . HYDROmorphone (DILAUDID) injection 0.5 mg  0.5 mg Intravenous Q3H PRN Barb Merino, MD   0.5 mg at 08/29/20 1325  . hyoscyamine (LEVSIN SL) SL tablet 0.125 mg  0.125 mg Sublingual Q6H PRN Blount, Xenia T, NP   0.125 mg at 08/29/20 1030  . niacin tablet 50 mg  50 mg Oral TID WC Barb Merino, MD   50 mg at 08/29/20 1208  . ondansetron (ZOFRAN) injection 4 mg  4 mg Intravenous Q6H PRN Barb Merino, MD      . pantoprazole (PROTONIX) EC tablet 40 mg  40 mg Oral BID Vena Rua, PA-C   40 mg at 08/29/20 0854  . promethazine (PHENERGAN) injection 12.5 mg  12.5 mg Intravenous Q6H PRN Barb Merino, MD   12.5  mg at 08/29/20 1325  . saccharomyces boulardii (FLORASTOR) capsule 250 mg  250 mg Oral BID Marylyn Ishihara, Tyrone A, DO   250 mg at 08/29/20 0854  . vancomycin (VANCOCIN) 50 mg/mL oral solution 500 mg  500 mg Oral QID Barb Merino, MD   500 mg at 08/29/20 8676     Discharge Medications: Please see discharge summary for a list of discharge medications.  Relevant Imaging Results:  Relevant Lab Results:   Additional Information [facility]; SS# [id number]  Amador Cunas, [place]

## 2020-08-29 NOTE — Progress Notes (Signed)
[name] Progress Note   Subjective:   Patient seen and examined at bedside in room.  Reports n/v last night and again this AM.  Diarrhea improving.  Denies CP and SOB.  Tolerated dialysis well yesterday.   Objective Vitals:   08/28/20 1210 08/28/20 1314 08/28/20 2033 08/29/20 0825  BP: (!) 153/80 139/60 (!) 157/79 (!) 150/86  Pulse:  100 100 (!) 110  Resp: 14 18 16 18   Temp: 98 F (36.7 C) 98 F (36.7 C) 100.2 F (37.9 C) 98 F (36.7 C)  TempSrc: Oral  Oral Oral  SpO2: 100% 100% 100% 93%  Weight: 58.8 kg     Height:       Physical Exam General:chronically ill appearing, elderly male in NAD Heart:+tachycardia, RR, no mrg Lungs:CTAB, nml WOB on RA Abdomen:soft, +tenderness Extremities:no edema on L, trace edema on R Dialysis Access: LU AVF +b/t   Filed Weights   08/28/20 0840 08/28/20 1210  Weight: 59.2 kg 58.8 kg    Intake/Output Summary (Last 24 hours) at 08/29/2020 1129 Last data filed at 08/28/2020 1210 Gross per 24 hour  Intake -  Output 1000 ml  Net -1000 ml    Additional Objective Labs: Basic Metabolic Panel: Recent Labs  Lab 08/25/20 1006 08/25/20 1413 08/28/20 0707  NA 133* 133* 133*  K 5.5* 5.3* 4.6  CL 97* 96* 96*  CO2 22 19* 23  GLUCOSE 76 74 91  BUN 52* 55* 49*  CREATININE 9.59* 9.76* 7.81*  CALCIUM 7.2* 7.5* 8.0*  PHOS  --  5.9* 5.1*   Liver Function Tests: Recent Labs  Lab 08/24/20 1511 08/25/20 1006 08/25/20 1413 08/28/20 0707  AST 15 12*  --   --   ALT 9 9  --   --   ALKPHOS 99 82  --   --   BILITOT 0.7 0.9  --   --   PROT 7.1 5.2*  --   --   ALBUMIN 3.3* 2.3* 2.3* 2.4*   CBC: Recent Labs  Lab 08/25/20 1413 08/26/20 0649 08/27/20 0545 08/28/20 0348 08/29/20 0258  WBC 31.8* 35.7* 27.3* 29.0* 27.3*  NEUTROABS  --  33.2* 24.2* 25.3* 24.9*  HGB 8.1* 8.6* 7.8* 8.7* 7.8*  HCT 24.3* 24.3* 23.4* 25.3* 23.1*  MCV 89.0 85.6 88.6 89.1 89.5  PLT 342 323 361 431* 417*   CBG: Recent Labs  Lab 08/28/20 1629  08/29/20 0655  GLUCAP 80 92   Medications:  . Chlorhexidine Gluconate Cloth  6 each Topical Q0600  . darbepoetin (ARANESP) injection - DIALYSIS  60 mcg Intravenous Q Sat-HD  . doxercalciferol  7 mcg Intravenous Q T,Th,Sa-HD  . niacin  50 mg Oral TID WC  . pantoprazole  40 mg Oral BID  . saccharomyces boulardii  250 mg Oral BID  . vancomycin  500 mg Oral QID    Dialysis Orders: [facility] 4h 450/500 58kg 3K/2Ca UFP2 L AVF No heparin  Mircera 50 (to start 2/22) Venofer 50  Hectorol 7   Assessment/Plan: 1. BRBPR/ABLA Hx GI bleed/rectal ca, supra therapeutic INR on admit (during previous admission EGD2/9with duodenal ulcer, treated w/ clip+ injection)On Protonix.  S/p 2 u prbcs 2/19. Hgb up and down, decreased to 7.8 today.  Per Primary/GI. 2. Nausea/vomiting - last night and again today.  Per primary/GI following.  3. ESRD - HD TTS. HD tomorrow per regular schedule.  last K 4.6. 4. C. Diff colitis. On PO Vanc/Florastor.  Plan for OP CTA abdomen to assess for nonocclusive  mesenteric ischemia.   5. AFib- on coumadin. Supratherapeutic INR on admission.Reversed with Vit K. Coumadin held. 6. HTNVolume - BP elevated.  Not on antihypertensives.  Does not appear volume overloaded. UF to EDW as tolerated. 7. Anemia CKD -- see #1.  Hgb 7.8, continue aranesp 60qwk.  8. MBD CKD --Corr Ca and phos ok. Continue home binders when taking PO. Hold Sensipar for now.  9. COPD  10. H/o prior rectal cancer - in 2014 treated w/ chemoRx,radiation, laparoscopic low anterior resection with diverting loop ileostomy and subsequent ileostomy takedown.   Jen Mow, PA-C [facility] 08/29/2020,11:29 AM  LOS: 5 days

## 2020-08-29 NOTE — Plan of Care (Signed)
  Problem: Education: Goal: Knowledge of General Education information will improve Description: Including pain rating scale, medication(s)/side effects and non-pharmacologic comfort measures Outcome: Progressing   Problem: Health Behavior/Discharge Planning: Goal: Ability to manage health-related needs will improve Outcome: Progressing   Problem: Activity: Goal: Risk for activity intolerance will decrease Outcome: Progressing   Problem: Coping: Goal: Level of anxiety will decrease Outcome: Progressing   Problem: Safety: Goal: Ability to remain free from injury will improve Outcome: Progressing   Problem: Nutrition: Goal: Adequate nutrition will be maintained Outcome: Not Progressing   Problem: Pain Managment: Goal: General experience of comfort will improve Outcome: Not Progressing   Problem: Skin Integrity: Goal: Risk for impaired skin integrity will decrease Outcome: Not Progressing

## 2020-08-29 NOTE — Evaluation (Signed)
Physical Therapy Evaluation Patient Details Name: Jason VINCELETTE Sr. MRN: [id number] DOB: October 15, 1945 Today's Date: 08/29/2020   History of Present Illness  Pt is a 75 y.o. male recently admitted 08/15/20-08/19/20 with upper GIB and duodenal ulcer, now readmitted 08/24/20 with C. difficile collitis, lower GIB. PMH includes afib on Coumadin, peptic ulcer disease, rectal CA, ESRD (HD TTS), CVA, gout, COPD, s/p partial lung lobectomy (2017).  Clinical Impression  PTA, patient lives with wife and reports independence and use of RW for community distance and occasional use in the house. Patient overall min guard for mobility with RW. Patient with reports of lightheadedness, dizziness, and weakness with movement. Patient presents with decreased activity tolerance, generalized weakness, impaired balance. Patient will benefit from skilled PT services during acute stay to address listed deficits. Recommend SNF following discharge as patient does not feel safe returning home and wants rehab to assist with return to PLOF.     Follow Up Recommendations SNF    Equipment Recommendations  3in1 (PT)    Recommendations for Other Services       Precautions / Restrictions Precautions Precautions: Fall Precaution Comments: per last admission, patient WBAT R LE in CAM boot. Not in room on date of eval Restrictions Weight Bearing Restrictions: Yes RLE Weight Bearing: Weight bearing as tolerated      Mobility  Bed Mobility Overal bed mobility: Needs Assistance Bed Mobility: Supine to Sit     Supine to sit: Min guard     General bed mobility comments: used therapist arm as brace to pull up on, otherwise no physical assist. Reports of lightheadedness upon sitting    Transfers Overall transfer level: Needs assistance Equipment used: Rolling walker (2 wheeled) Transfers: Sit to/from Stand Sit to Stand: Min guard         General transfer comment: min guard for safety. Reports of dizziness and  lightheadedness in standing  Ambulation/Gait Ambulation/Gait assistance: Min guard Gait Distance (Feet): 3 Feet Assistive device: Rolling walker (2 wheeled) Gait Pattern/deviations: Step-to pattern;Decreased stride length;Trunk flexed Gait velocity: decreased   General Gait Details: min guard for safety, mildly off balance initially due to reports dizziness  Stairs            Wheelchair Mobility    Modified Rankin (Stroke Patients Only)       Balance Overall balance assessment: Mild deficits observed, not formally tested                                           Pertinent Vitals/Pain Pain Assessment: 0-10 Pain Score: 5  Pain Location: abdomen Pain Descriptors / Indicators: Pressure;Sore Pain Intervention(s): Monitored during session;Repositioned    Home Living Family/patient expects to be discharged to:: Private residence Living Arrangements: Spouse/significant other Available Help at Discharge: Family;Available 24 hours/day Type of Home: House Home Access: Level entry     Home Layout: One level Home Equipment: Walker - 2 wheels      Prior Function Level of Independence: Independent with assistive device(s)         Comments: reports that he uses RW to goto HD and sometimes in the house     Hand Dominance   Dominant Hand: Right    Extremity/Trunk Assessment   Upper Extremity Assessment Upper Extremity Assessment: Defer to OT evaluation    Lower Extremity Assessment Lower Extremity Assessment: Generalized weakness       Communication  Communication: No difficulties  Cognition Arousal/Alertness: Awake/alert Behavior During Therapy: Flat affect Overall Cognitive Status: Within Functional Limits for tasks assessed                                        General Comments      Exercises     Assessment/Plan    PT Assessment Patient needs continued PT services  PT Problem List Decreased  strength;Decreased activity tolerance;Decreased balance;Decreased mobility       PT Treatment Interventions DME instruction;Balance training;Gait training;Stair training;Cognitive remediation;Functional mobility training;Patient/family education;Therapeutic activities;Therapeutic exercise    PT Goals (Current goals can be found in the Care Plan section)  Acute Rehab PT Goals Patient Stated Goal: to go to rehab before returning home PT Goal Formulation: With patient Time For Goal Achievement: 09/12/20 Potential to Achieve Goals: Good    Frequency Min 2X/week   Barriers to discharge        Co-evaluation               AM-PAC PT "6 Clicks" Mobility  Outcome Measure Help needed turning from your back to your side while in a flat bed without using bedrails?: A Little Help needed moving from lying on your back to sitting on the side of a flat bed without using bedrails?: A Little Help needed moving to and from a bed to a chair (including a wheelchair)?: A Little Help needed standing up from a chair using your arms (e.g., wheelchair or bedside chair)?: A Little Help needed to walk in hospital room?: A Little Help needed climbing 3-5 steps with a railing? : A Lot 6 Click Score: 17    End of Session   Activity Tolerance: Patient tolerated treatment well Patient left: in chair;with call bell/phone within reach;with chair alarm set Nurse Communication: Mobility status PT Visit Diagnosis: Unsteadiness on feet (R26.81);Difficulty in walking, not elsewhere classified (R26.2);Muscle weakness (generalized) (M62.81)    Time: 9767-3419 PT Time Calculation (min) (ACUTE ONLY): 30 min   Charges:   PT Evaluation $PT Eval Moderate Complexity: 1 Mod          Alayna A. Gilford Rile PT, DPT Acute Rehabilitation Services Pager [phone] Office [phone]   Linna Hoff 08/29/2020, 1:12 PM

## 2020-08-29 NOTE — TOC Initial Note (Signed)
Transition of Care (TOC) - Initial/Assessment Note    Patient Details  Name: Jason BERGDOLL Sr. MRN: [id number] Date of Birth: 1945-07-12  Transition of Care [facility]) CM/SW Contact:    Amador Cunas, [place] Phone Number: 08/29/2020, 2:31 PM  Clinical Narrative:   Unable to reach pt or pt's wife by phone. Per PT eval, pt requesting SNF placement for STR. HD at [facility] TTS. Will begin SNF search and f/u with offers to pt/pt's wife. [place] auth request for SNF submitted.   Wandra Feinstein, MSW, LCSW [phone] (coverage)                   Expected Discharge Plan: [place] Barriers to Discharge: Continued Medical Work up,Insurance Authorization,No SNF bed   Patient Goals and CMS Choice        Expected Discharge Plan and Services Expected Discharge Plan: [place] arrangements for the past 2 months: Single Family Home                                      Prior Living Arrangements/Services Living arrangements for the past 2 months: Single Family Home Lives with:: Spouse   Do you feel safe going back to the place where you live?: No   Requesting SNF for STR  Need for Family Participation in Patient Care: Yes (Comment) Care giver support system in place?: Yes (comment)      Activities of Daily Living Home Assistive Devices/Equipment: Gilford Rile (specify type) (four wheel) ADL Screening (condition at time of admission) Patient's cognitive ability adequate to safely complete daily activities?: Yes Is the patient deaf or have difficulty hearing?: No Does the patient have difficulty seeing, even when wearing glasses/contacts?: No Does the patient have difficulty concentrating, remembering, or making decisions?: No Patient able to express need for assistance with ADLs?: Yes Does the patient have difficulty dressing or bathing?: No Independently performs ADLs?: Yes (appropriate for  developmental age) Does the patient have difficulty walking or climbing stairs?: Yes Weakness of Legs: Both Weakness of Arms/Hands: None  Permission Sought/Granted Permission sought to share information with : Chartered certified accountant granted to share information with : Yes, Verbal Permission Granted              Emotional Assessment       Orientation: : Oriented to Self,Oriented to Place,Oriented to  Time,Oriented to Situation      Admission diagnosis:  GIB (gastrointestinal bleeding) [K92.2] C. difficile colitis [A04.72] Gastrointestinal hemorrhage, unspecified gastrointestinal hemorrhage type [K92.2] Patient Active Problem List   Diagnosis Date Noted  . C. difficile colitis   . GIB (gastrointestinal bleeding) 08/24/2020  . GI bleed 08/15/2020  . Acute blood loss anemia 08/15/2020  . Supratherapeutic INR 08/15/2020  . Fibula fracture 08/15/2020  . Hematochezia   . Acute gastric ulcer with hemorrhage   . Leg edema, right 08/13/2020  . Injury of right leg 08/13/2020  . Closed nondisplaced segmental fracture of shaft of right fibula 08/13/2020  . Cellulitis of right leg 08/06/2020  . Complication of vascular dialysis catheter 04/18/2020  . Other disorders of phosphorus metabolism 04/04/2020  . Allergy, unspecified, initial encounter 03/01/2020  . Anaphylactic shock, unspecified, initial encounter 03/01/2020  . Fever 01/16/2020  . Benign prostatic hyperplasia without lower urinary tract symptoms 12/12/2019  . Meralgia paraesthetica, right 08/12/2019  . Ataxia due  to old cerebrovascular accident (CVA) 05/23/2019  . Gait disturbance, post-stroke 04/17/2019  . Iron deficiency anemia, unspecified 03/14/2019  . Chronic obstructive pulmonary disease, unspecified ([place]) 01/25/2019  . Coagulopathy ([place]) 01/25/2019  . Diarrhea, unspecified 01/25/2019  . Hypertensive chronic kidney disease with stage 5 chronic kidney disease or end stage renal disease ([place])  01/25/2019  . Diverticulosis of intestine, part unspecified, without perforation or abscess without bleeding 01/25/2019  . Other idiopathic peripheral autonomic neuropathy 01/25/2019  . Hypokalemia 01/25/2019  . Male erectile dysfunction, unspecified 01/25/2019  . Pain, unspecified 01/25/2019  . Primary generalized (osteo)arthritis 01/25/2019  . Pruritus, unspecified 01/25/2019  . Secondary hyperparathyroidism of renal origin ([place]) 01/25/2019  . Anticoagulated 12/31/2018  . Paroxysmal atrial fibrillation ([place]) 12/22/2018  . Longstanding persistent atrial fibrillation ([place]) 09/20/2018  . Functional diarrhea 07/14/2018  . Benign essential HTN   . ESRD on dialysis ([place]) 06/16/2018  . Mild malnutrition ([place]) 06/16/2018  . Stroke due to embolism ([place]) 06/16/2018  . Chronic HFrEF (heart failure with reduced ejection fraction) ([place])   . CVA (cerebral vascular accident) ([place]) 06/08/2018  . CHF (congestive heart failure) ([place]) 05/08/2018  . Hyperlipidemia LDL goal <70 09/23/2017  . OAB (overactive bladder) 01/09/2016  . SUI (stress urinary incontinence), male 01/09/2016  . Hypersomnia 10/30/2015  . Obesity (BMI 30.0-34.9) 10/30/2015  . Routine general medical examination at a health care facility 06/04/2015  . Medicare annual wellness visit, subsequent 06/04/2015  . Hereditary and idiopathic peripheral neuropathy 09/14/2014  . Rectal cancer ([place]) 02/01/2013  . Obstructive sleep apnea 07/29/2010  . GOUT 05/11/2007  . ERECTILE DYSFUNCTION, MILD 05/11/2007  . Essential hypertension 05/11/2007   PCP:  [name], MD Pharmacy:   [facility], [place] [address] Phone: [phone] Fax: [phone]  [facility], [place], [address] [place], [place] [address] Phone: [phone] Fax: [phone]  [facility] - [place], [place] [address] [place] [address] Phone: [phone] Fax: [phone]  [facility] [place] [place], [place], [address] - [place], [place] - [place], [place] [address] [place] [address] Phone: [phone] Fax: [phone]  [facility] - [place], [place] - [address] [place] [address] Phone: [phone] Fax: [phone]     Social Determinants of Health (SDOH) Interventions    Readmission Risk Interventions No flowsheet data found.

## 2020-08-30 DIAGNOSIS — Z85040 Personal history of malignant carcinoid tumor of rectum: Secondary | ICD-10-CM

## 2020-08-30 DIAGNOSIS — R911 Solitary pulmonary nodule: Secondary | ICD-10-CM

## 2020-08-30 DIAGNOSIS — N3281 Overactive bladder: Secondary | ICD-10-CM

## 2020-08-30 DIAGNOSIS — Z681 Body mass index (BMI) 19 or less, adult: Secondary | ICD-10-CM

## 2020-08-30 DIAGNOSIS — Z8673 Personal history of transient ischemic attack (TIA), and cerebral infarction without residual deficits: Secondary | ICD-10-CM

## 2020-08-30 DIAGNOSIS — D62 Acute posthemorrhagic anemia: Secondary | ICD-10-CM | POA: Diagnosis not present

## 2020-08-30 DIAGNOSIS — M1030 Gout due to renal impairment, unspecified site: Secondary | ICD-10-CM | POA: Diagnosis not present

## 2020-08-30 DIAGNOSIS — K922 Gastrointestinal hemorrhage, unspecified: Secondary | ICD-10-CM | POA: Diagnosis not present

## 2020-08-30 DIAGNOSIS — I132 Hypertensive heart and chronic kidney disease with heart failure and with stage 5 chronic kidney disease, or end stage renal disease: Secondary | ICD-10-CM | POA: Diagnosis not present

## 2020-08-30 DIAGNOSIS — S82401D Unspecified fracture of shaft of right fibula, subsequent encounter for closed fracture with routine healing: Secondary | ICD-10-CM | POA: Diagnosis not present

## 2020-08-30 DIAGNOSIS — Z992 Dependence on renal dialysis: Secondary | ICD-10-CM

## 2020-08-30 DIAGNOSIS — N393 Stress incontinence (female) (male): Secondary | ICD-10-CM

## 2020-08-30 DIAGNOSIS — J449 Chronic obstructive pulmonary disease, unspecified: Secondary | ICD-10-CM

## 2020-08-30 DIAGNOSIS — Z902 Acquired absence of lung [part of]: Secondary | ICD-10-CM

## 2020-08-30 DIAGNOSIS — I088 Other rheumatic multiple valve diseases: Secondary | ICD-10-CM | POA: Diagnosis not present

## 2020-08-30 DIAGNOSIS — R1084 Generalized abdominal pain: Secondary | ICD-10-CM | POA: Diagnosis present

## 2020-08-30 DIAGNOSIS — M791 Myalgia, unspecified site: Secondary | ICD-10-CM

## 2020-08-30 DIAGNOSIS — K250 Acute gastric ulcer with hemorrhage: Secondary | ICD-10-CM | POA: Diagnosis not present

## 2020-08-30 DIAGNOSIS — I5022 Chronic systolic (congestive) heart failure: Secondary | ICD-10-CM | POA: Diagnosis not present

## 2020-08-30 DIAGNOSIS — I0981 Rheumatic heart failure: Secondary | ICD-10-CM | POA: Diagnosis not present

## 2020-08-30 DIAGNOSIS — K921 Melena: Secondary | ICD-10-CM | POA: Diagnosis not present

## 2020-08-30 DIAGNOSIS — E669 Obesity, unspecified: Secondary | ICD-10-CM

## 2020-08-30 DIAGNOSIS — I480 Paroxysmal atrial fibrillation: Secondary | ICD-10-CM | POA: Diagnosis not present

## 2020-08-30 DIAGNOSIS — N186 End stage renal disease: Secondary | ICD-10-CM | POA: Diagnosis not present

## 2020-08-30 DIAGNOSIS — Z9181 History of falling: Secondary | ICD-10-CM

## 2020-08-30 DIAGNOSIS — G609 Hereditary and idiopathic neuropathy, unspecified: Secondary | ICD-10-CM

## 2020-08-30 DIAGNOSIS — R1110 Vomiting, unspecified: Secondary | ICD-10-CM | POA: Diagnosis present

## 2020-08-30 DIAGNOSIS — G4733 Obstructive sleep apnea (adult) (pediatric): Secondary | ICD-10-CM

## 2020-08-30 DIAGNOSIS — N529 Male erectile dysfunction, unspecified: Secondary | ICD-10-CM

## 2020-08-30 DIAGNOSIS — Z87891 Personal history of nicotine dependence: Secondary | ICD-10-CM

## 2020-08-30 DIAGNOSIS — A0472 Enterocolitis due to Clostridium difficile, not specified as recurrent: Secondary | ICD-10-CM | POA: Diagnosis not present

## 2020-08-30 DIAGNOSIS — Z7901 Long term (current) use of anticoagulants: Secondary | ICD-10-CM

## 2020-08-30 DIAGNOSIS — K5791 Diverticulosis of intestine, part unspecified, without perforation or abscess with bleeding: Secondary | ICD-10-CM | POA: Diagnosis not present

## 2020-08-30 DIAGNOSIS — D631 Anemia in chronic kidney disease: Secondary | ICD-10-CM | POA: Diagnosis not present

## 2020-08-30 DIAGNOSIS — H9190 Unspecified hearing loss, unspecified ear: Secondary | ICD-10-CM

## 2020-08-30 DIAGNOSIS — G4710 Hypersomnia, unspecified: Secondary | ICD-10-CM

## 2020-08-30 LAB — RENAL FUNCTION PANEL
Albumin: 2.2 g/dL — ABNORMAL LOW (ref 3.5–5.0)
Anion gap: 19 — ABNORMAL HIGH (ref 5–15)
BUN: 48 mg/dL — ABNORMAL HIGH (ref 8–23)
CO2: 24 mmol/L (ref 22–32)
Calcium: 8.7 mg/dL — ABNORMAL LOW (ref 8.9–10.3)
Chloride: 94 mmol/L — ABNORMAL LOW (ref 98–111)
Creatinine, Ser: 7.4 mg/dL — ABNORMAL HIGH (ref 0.61–1.24)
GFR, Estimated: 7 mL/min — ABNORMAL LOW (ref >60)
Glucose, Bld: 93 mg/dL (ref 70–99)
Phosphorus: 6.3 mg/dL — ABNORMAL HIGH (ref 2.5–4.6)
Potassium: 4.2 mmol/L (ref 3.5–5.1)
Sodium: 137 mmol/L (ref 135–145)

## 2020-08-30 LAB — CBC WITH DIFFERENTIAL/PLATELET
Abs Immature Granulocytes: 0.1 10*3/uL — ABNORMAL HIGH (ref 0.00–0.07)
Basophils Absolute: 0 10*3/uL (ref 0.0–0.1)
Basophils Relative: 0 %
Eosinophils Absolute: 0 10*3/uL (ref 0.0–0.5)
Eosinophils Relative: 0 %
HCT: 21.7 % — ABNORMAL LOW (ref 39.0–52.0)
Hemoglobin: 7 g/dL — ABNORMAL LOW (ref 13.0–17.0)
Immature Granulocytes: 1 %
Lymphocytes Relative: 3 %
Lymphs Abs: 0.6 10*3/uL — ABNORMAL LOW (ref 0.7–4.0)
MCH: 29.4 pg (ref 26.0–34.0)
MCHC: 32.3 g/dL (ref 30.0–36.0)
MCV: 91.2 fL (ref 80.0–100.0)
Monocytes Absolute: 1.8 10*3/uL — ABNORMAL HIGH (ref 0.1–1.0)
Monocytes Relative: 10 %
Neutro Abs: 15.4 10*3/uL — ABNORMAL HIGH (ref 1.7–7.7)
Neutrophils Relative %: 86 %
Platelets: 414 10*3/uL — ABNORMAL HIGH (ref 150–400)
RBC: 2.38 MIL/uL — ABNORMAL LOW (ref 4.22–5.81)
RDW: 17.7 % — ABNORMAL HIGH (ref 11.5–15.5)
WBC: 18 10*3/uL — ABNORMAL HIGH (ref 4.0–10.5)
nRBC: 0 % (ref 0.0–0.2)

## 2020-08-30 LAB — PREPARE RBC (CROSSMATCH)

## 2020-08-30 MED ORDER — SODIUM CHLORIDE 0.9% IV SOLUTION: Freq: Once | INTRAVENOUS | Status: DC

## 2020-08-30 MED ORDER — DOXERCALCIFEROL 4 MCG/2ML IV SOLN
INTRAVENOUS | Status: AC
  Filled 2020-08-30: qty 4

## 2020-08-30 MED ORDER — BISACODYL 5 MG PO TBEC: 10.0000 mg | DELAYED_RELEASE_TABLET | Freq: Four times a day (QID) | ORAL | Status: DC

## 2020-08-30 MED ORDER — ACETAMINOPHEN 325 MG PO TABS
ORAL_TABLET | ORAL | Status: AC
  Filled 2020-08-30: qty 2

## 2020-08-30 NOTE — Progress Notes (Signed)
PROGRESS NOTE    JLON BETKER Sr.  ZOX:[id number] DOB: December 16, 1945 DOA: 08/24/2020 PCP: [name], MD    Brief Narrative:  75 year old gentleman with history of chronic A. fib on Coumadin, peptic ulcer disease, ESRD on hemodialysis TTS schedule who presented to the ER with abdominal cramping, nausea vomiting diarrhea and rectal bleeding.  Patient was recently in the hospital 2/9-2/13 with upper GI bleeding and found to have bleeding duodenal ulcer treated with epinephrine injection and clipping.  Discharged on Protonix twice daily.Patient also has chronic diarrhea.  He has history of rectal cancer with resection.  Admitted with rectal bleeding, C. difficile infection and diarrhea and anemia.  Patient was hemodynamically stable in the ER. Admitted with C. diff, rectal bleeding. Followed by GI.  Assessment & Plan:   Active Problems:   Hematochezia   GIB (gastrointestinal bleeding)   C. difficile colitis   Generalized abdominal pain   Non-intractable vomiting  C. difficile colitis with diarrhea and abdominal pain: CT scan with evidence of proctitis.  Patient has history of low anterior resection as well as ileostomy and reversal. Recent colonoscopies were significantly normal with evidence of diverticulosis. With worsening WBC count and abdominal pain, treated with higher dose of oral vancomycin.  Diet as tolerated. Today with some relieve in nausea, diet as tolerated.  Will add Phenergan, Zofran and low-dose Dilaudid as patient is dialysis patient. Advised him to eat liquid diet and solid if he feels better.  Lower GI bleeding, painless rectal bleeding: Anemia of acute blood loss. Suspect outlet bleeding from proctitis/radiation injury with ongoing diarrhea and supratherapeutic INR. Currently hemodynamically stabilizing. Supratherapeutic INR, presented with 8.8-aggressively reversed with 2 units of FFP, IV vitamin K. Coumadin on hold. Hemoglobin 8.9-7.6-6.8-1 unit of  transfusion-8.1-1 unit of transfusion 8.6-7 Received 1 unit PRBC today.  Followed by GI, anticipate conservative management with active C. difficile infection. On oral Protonix for upper GI bleeding.  Acquired thrombophilia, chronic A. fib on Coumadin with supratherapeutic INR: Presented with INR of 8.8, on Coumadin and probably supratherapeutic due to acute illnesses. Aggressively reversed. Continue to hold Coumadin, no need for further reversal. A. fib is rate controlled.  ESRD on hemodialysis: Stable.  Getting dialysis on his schedule.  Suspected B3 deficiency: Patient recently had vitamin B3 level checked, niacin level 30.  Does not have other evidence of niacin deficiency.  We will put patient on trial of niacin replacement.  No contraindications.  Patient is stabilizing.  Start working with therapies. May need SNF at discharge.  DVT prophylaxis: SCDs Start: 08/24/20 2254   Code Status: Full code Family Communication: [name] at the bedside. Disposition Plan: Status is: Inpatient  Remains inpatient appropriate because:Persistent severe electrolyte disturbances and IV treatments appropriate due to intensity of illness or inability to take PO   Dispo: The patient is from: Home              Anticipated d/c is to: SNF              Anticipated d/c date is: 2 days              Patient currently is not medically stable to d/c.   Difficult to place patient No  Consultants:   Gastroenterology  Nephrology  Procedures:   Routine dialysis  Antimicrobials:   Vancomycin by mouth 2/18---   Subjective:  Patient seen and examined.  Wife at the bedside.  Came back from dialysis.  Today he is complaining of less abdominal pain.  He had 1 bloody bowel movement overnight.  Denies any nausea at this time.  He used 1 dose of Zofran 5 AM in the morning.  None since then.  He thinks he can tolerate liquid diet.   Objective: Vitals:   08/30/20 1100 08/30/20 1130 08/30/20 1152  08/30/20 1254  BP: 111/70 (!) 108/57 (!) 114/56 137/89  Pulse: (!) 113 (!) 124 69 (!) 106  Resp: 16 18 18 18   Temp: 97.8 F (36.6 C)  97.8 F (36.6 C) 98.4 F (36.9 C)  TempSrc: Oral  Oral Oral  SpO2:   97% 100%  Weight:   56.2 kg   Height:        Intake/Output Summary (Last 24 hours) at 08/30/2020 1452 Last data filed at 08/30/2020 1152 Gross per 24 hour  Intake -  Output 343 ml  Net -343 ml   Filed Weights   08/28/20 1210 08/30/20 0844 08/30/20 1152  Weight: 58.8 kg 56.7 kg 56.2 kg    Examination: General: Chronically sick looking gentleman, is in mild distress and anxious. Cardiovascular: S1-S2 normal. Respiratory: Bilateral clear. Gastrointestinal: Soft.  Generalized tenderness along the right upper quadrant and epigastrium with no rigidity or guarding.  Bowel sounds present. Ext: Generalized weakness. Neuro: No focal deficits.     Data Reviewed: I have personally reviewed following labs and imaging studies  CBC: Recent Labs  Lab 08/26/20 0649 08/27/20 0545 08/28/20 0348 08/29/20 0258 08/30/20 0231  WBC 35.7* 27.3* 29.0* 27.3* 18.0*  NEUTROABS 33.2* 24.2* 25.3* 24.9* 15.4*  HGB 8.6* 7.8* 8.7* 7.8* 7.0*  HCT 24.3* 23.4* 25.3* 23.1* 21.7*  MCV 85.6 88.6 89.1 89.5 91.2  PLT 323 361 431* 417* 681*   Basic Metabolic Panel: Recent Labs  Lab 08/24/20 1511 08/25/20 1006 08/25/20 1413 08/26/20 0649 08/28/20 0707 08/30/20 0927  NA 133* 133* 133*  --  133* 137  K 4.7 5.5* 5.3*  --  4.6 4.2  CL 91* 97* 96*  --  96* 94*  CO2 25 22 19*  --  23 24  GLUCOSE 87 76 74  --  91 93  BUN 45* 52* 55*  --  49* 48*  CREATININE 9.08* 9.59* 9.76*  --  7.81* 7.40*  CALCIUM 7.9* 7.2* 7.5*  --  8.0* 8.7*  MG  --   --   --  1.8  --   --   PHOS  --   --  5.9*  --  5.1* 6.3*   GFR: Estimated Creatinine Clearance: 7.1 mL/min (A) (by C-G formula based on SCr of 7.4 mg/dL (H)). Liver Function Tests: Recent Labs  Lab 08/24/20 1511 08/25/20 1006 08/25/20 1413  08/28/20 0707 08/30/20 0927  AST 15 12*  --   --   --   ALT 9 9  --   --   --   ALKPHOS 99 82  --   --   --   BILITOT 0.7 0.9  --   --   --   PROT 7.1 5.2*  --   --   --   ALBUMIN 3.3* 2.3* 2.3* 2.4* 2.2*   No results for input(s): LIPASE, AMYLASE in the last 168 hours. No results for input(s): AMMONIA in the last 168 hours. Coagulation Profile: Recent Labs  Lab 08/24/20 1536 08/25/20 1006 08/26/20 0649  INR 8.8* 2.6* 1.8*   Cardiac Enzymes: No results for input(s): CKTOTAL, CKMB, CKMBINDEX, TROPONINI in the last 168 hours. BNP (last 3 results) No results for input(s): PROBNP in the  last 8760 hours. HbA1C: No results for input(s): HGBA1C in the last 72 hours. CBG: Recent Labs  Lab 08/28/20 1629 08/29/20 0655  GLUCAP 80 92   Lipid Profile: No results for input(s): CHOL, HDL, LDLCALC, TRIG, CHOLHDL, LDLDIRECT in the last 72 hours. Thyroid Function Tests: No results for input(s): TSH, T4TOTAL, FREET4, T3FREE, THYROIDAB in the last 72 hours. Anemia Panel: No results for input(s): VITAMINB12, FOLATE, FERRITIN, TIBC, IRON, RETICCTPCT in the last 72 hours. Sepsis Labs: No results for input(s): PROCALCITON, LATICACIDVEN in the last 168 hours.  Recent Results (from the past 240 hour(s))  Ova and parasite examination     Status: None   Collection Time: 08/23/20  3:21 PM   Specimen: Stool  Result Value Ref Range Status   MICRO NUMBER: [id number]  Final   SPECIMEN QUALITY: Adequate  Final   Source STOOL  Final   STATUS: FINAL  Final   CONCENTRATE RESULT: No ova or parasites seen  Final   TRICHROME RESULT: No ova or parasites seen  Final   COMMENT:   Final    Routine Ova and Parasite exam may not detect some parasites that occasionally cause diarrheal illness. Cryptosporidium Antigen and/or Cyclospora and Isospora Exam may be ordered to detect these parasites. One negative sample does not necessarily rule out  the presence of a parasitic infection.  For additional information,  please refer to https://education.questdiagnostics.com/faq/FAQ203 (This link is being provided for informational/ educational purposes only.)   Resp Panel by RT-PCR (Flu A&B, Covid) Nasopharyngeal Swab     Status: None   Collection Time: 08/24/20  3:40 PM   Specimen: Nasopharyngeal Swab; Nasopharyngeal(NP) swabs in vial transport medium  Result Value Ref Range Status   SARS Coronavirus 2 by RT PCR NEGATIVE NEGATIVE Final    Comment: (NOTE) SARS-CoV-2 target nucleic acids are NOT DETECTED.  The SARS-CoV-2 RNA is generally detectable in upper respiratory specimens during the acute phase of infection. The lowest concentration of SARS-CoV-2 viral copies this assay can detect is 138 copies/mL. A negative result does not preclude SARS-Cov-2 infection and should not be used as the sole basis for treatment or other patient management decisions. A negative result may occur with  improper specimen collection/handling, submission of specimen other than nasopharyngeal swab, presence of viral mutation(s) within the areas targeted by this assay, and inadequate number of viral copies(<138 copies/mL). A negative result must be combined with clinical observations, patient history, and epidemiological information. The expected result is Negative.  Fact Sheet for Patients:  EntrepreneurPulse.com.au  Fact Sheet for Healthcare Providers:  IncredibleEmployment.be  This test is no t yet approved or cleared by the [place] FDA and  has been authorized for detection and/or diagnosis of SARS-CoV-2 by FDA under an Emergency Use Authorization (EUA). This EUA will remain  in effect (meaning this test can be used) for the duration of the COVID-19 declaration under Section 564(b)(1) of the Act, 21 U.S.C.section 360bbb-3(b)(1), unless the authorization is terminated  or revoked sooner.       Influenza A by PCR NEGATIVE NEGATIVE Final   Influenza B by PCR NEGATIVE  NEGATIVE Final    Comment: (NOTE) The Xpert Xpress SARS-CoV-2/FLU/RSV plus assay is intended as an aid in the diagnosis of influenza from Nasopharyngeal swab specimens and should not be used as a sole basis for treatment. Nasal washings and aspirates are unacceptable for Xpert Xpress SARS-CoV-2/FLU/RSV testing.  Fact Sheet for Patients: EntrepreneurPulse.com.au  Fact Sheet for Healthcare Providers: IncredibleEmployment.be  This test is not yet  approved or cleared by the [place] and has been authorized for detection and/or diagnosis of SARS-CoV-2 by FDA under an Emergency Use Authorization (EUA). This EUA will remain in effect (meaning this test can be used) for the duration of the COVID-19 declaration under Section 564(b)(1) of the Act, 21 U.S.C. section 360bbb-3(b)(1), unless the authorization is terminated or revoked.  Performed at [facility], [place] [address] [place], [place] [address]   MRSA PCR Screening     Status: None   Collection Time: 08/26/20  8:05 AM   Specimen: Nasal Mucosa; Nasopharyngeal  Result Value Ref Range Status   MRSA by PCR NEGATIVE NEGATIVE Final    Comment:        The GeneXpert MRSA Assay (FDA approved for NASAL specimens only), is one component of a comprehensive MRSA colonization surveillance program. It is not intended to diagnose MRSA infection nor to guide or monitor treatment for MRSA infections. Performed at [facility], [place] [address], [place],  [address]          Radiology Studies: CT Angio Abd/Pel w/ and/or w/o  Result Date: 08/30/2020 CLINICAL DATA:  75 year old with intestinal malabsorption. Abdominal pain. Diarrhea. Evaluate for mesenteric ischemia. History of rectal cancer. EXAM: CTA ABDOMEN AND PELVIS WITHOUT AND WITH CONTRAST TECHNIQUE: Multidetector CT imaging of the abdomen and pelvis was performed using the standard protocol during bolus  administration of intravenous contrast. Multiplanar reconstructed images and MIPs were obtained and reviewed to evaluate the vascular anatomy. CONTRAST:  58mL OMNIPAQUE IOHEXOL 350 MG/ML SOLN COMPARISON:  CT abdomen and pelvis 08/24/2020 and chest CT 03/14/2020 FINDINGS: VASCULAR Aorta: Normal caliber of the visualized thoracic aorta. Abdominal aorta has mild atherosclerotic disease without aneurysm or dissection. Celiac: Atherosclerotic calcifications at the origin without significant stenosis. Main branch vessels are patent without evidence of aneurysm. SMA: SMA is widely patent. Variant anatomy with a replaced right hepatic artery. Main SMA branches are patent. Renals: Bilateral renal arteries are patent. At least mild stenosis involving the proximal right renal artery. No evidence for renal artery aneurysm. IMA: Origin of the IMA appears to be occluded. There is flow in the distal IMA branches through collateral flow from the SMA. Inflow: Atherosclerotic plaque in the iliac arteries. The common iliac arteries are patent without significant stenosis. Focal narrowing in the distal left external iliac artery causing mild-to-moderate narrowing. Right external iliac artery is patent without significant stenosis. Atherosclerotic plaque and high-grade stenosis involving the proximal internal iliac arteries bilaterally. Proximal Outflow: Proximal femoral arteries are patent bilaterally. Veins: Portal venous system is patent. SMV is patent. IVC is small but patent. Iliac veins are patent. No gross venous abnormality. Review of the MIP images confirms the above findings. NON-VASCULAR Lower chest: Visualized pulmonary arteries are patent. Heart size is normal without significant pericardial effusion. Distal esophagus is distended with fluid. Patient has had previous left lung surgery. Chronic soft tissue thickening or nodularity in the left upper chest on sequence 1, image 12 is similar to the chest CT from 09/12/2019.  Stable pleural-based thickening with calcifications in the anterior left chest. Peripheral densities in left lower lobe are most compatible with atelectasis. Stable pleural-based calcifications in the anterior right chest. Bandlike density in the right lower lobe is suggestive for atelectasis. No large pleural effusions. Hepatobiliary: Indeterminate 7 mm hypodensity in the right hepatic lobe on sequence 11, image 25. No suspicious hepatic lesions. Normal appearance of the gallbladder. No biliary dilatation. Pancreas: Unremarkable. No pancreatic ductal dilatation or  surrounding inflammatory changes. Spleen: Normal in size without focal abnormality. Adrenals/Urinary Tract: Normal appearance of both adrenal glands. Again noted are hyperdense and hypodense lesions in both kidneys. Largest hyperdense area in the right kidney lower pole measures up to 4.4 cm and Hounsfield units do not significant change between the arterial and venous phase. Largest hyperdense lesion on the left measures up to 2.6 cm and Hounsfield units do not significantly change between the arterial and venous phase images. No hydronephrosis. Normal appearance of the urinary bladder. Stomach/Bowel: Since 08/24/2020, there has been increased distension of the stomach. Increased colonic distension and increased small bowel distention particularly in the lower abdomen. Again noted is perirectal stranding and edema. Small amount of presacral edema. Moderate amount of stool like material in the rectum. Dilated loop of small bowel in the left lower quadrant measures up to 3.7 cm. No focal wall thickening. Evidence for a bowel anastomosis in the right anterior abdomen on sequence 5, image 90. Lymphatic: No significant lymph node enlargement in the abdomen or pelvis. Reproductive: Prostate is unremarkable. Other: Mild subcutaneous edema. No significant ascites. There may be trace edema in the anterior abdomen on sequence 5, image 78. Musculoskeletal: Stable  sclerosis in the superior left femoral head could represent a small focus of avascular necrosis. Bridging osteophytes along the right side of the thoracic spine. IMPRESSION: VASCULAR 1. 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 There appears to be distal reconstitution of the IMA distribution. Celiac artery and SMA are patent without significant stenosis. 2. Mild atherosclerotic disease in the abdominal aorta without aneurysm. 3. Severe stenosis involving the proximal internal iliac arteries bilaterally. Mild-to-moderate narrowing in the left external iliac artery. NON-VASCULAR 1. Increased distension of the stomach, small bowel and colon since 08/24/2020. Based on the history of diarrhea, findings are unlikely related to an obstructive process. Persistent perirectal stranding and edema that could represent inflammatory changes near the rectum. Overall, no significant bowel wall thickening. 2. Dilatation of the distal esophagus related to the gastric distension. 3. Again noted are numerous hyperdense and hypodense renal lesions. The hyperdense lesions are similar to the recent noncontrast examination and these hyperdense lesions do not significantly change in Hounsfield units between the arterial and venous phase. Technically, these are indeterminate lesions but suspect these represent hemorrhagic or proteinaceous cysts. 4. Stable nodularity in the left upper chest. Electronically Signed   By: Markus Daft M.D.   On: 08/30/2020 11:15        Scheduled Meds: . sodium chloride   Intravenous Once  . Chlorhexidine Gluconate Cloth  6 each Topical Q0600  . Chlorhexidine Gluconate Cloth  6 each Topical Q0600  . darbepoetin (ARANESP) injection - DIALYSIS  60 mcg Intravenous Q Sat-HD  . doxercalciferol      . doxercalciferol  7 mcg Intravenous Q T,Th,Sa-HD  . niacin  50 mg Oral TID WC  . pantoprazole  40 mg Oral BID  . saccharomyces boulardii  250 mg Oral BID  . vancomycin  500 mg Oral QID    Continuous Infusions:    LOS: 6 days    Time spent: 30 minutes    Barb Merino, MD [facility] Pager [phone]

## 2020-08-30 NOTE — Plan of Care (Signed)
  Problem: Elimination: Goal: Will not experience complications related to bowel motility Outcome: Progressing Goal: Will not experience complications related to urinary retention Outcome: Progressing   Problem: Safety: Goal: Ability to remain free from injury will improve Outcome: Progressing   

## 2020-08-30 NOTE — Progress Notes (Signed)
[name] ROUNDING NOTE   Subjective: Continues to have nausea/vomiting yesterday and last night, however today he states this is the best he has felt in a while and was actively consuming clear liquid lunch without any issue.  Pain much improved, no nausea/vomiting today.  Wife reports some blood in stool earlier in the day.  CTA completed and results reviewed with the patient and his wife at bedside today.    Objective: Vital signs in last 24 hours: Temp:  [97.6 F (36.4 C)-98.4 F (36.9 C)] 98.4 F (36.9 C) (02/24 1254) Pulse Rate:  [69-126] 106 (02/24 1254) Resp:  [15-20] 18 (02/24 1254) BP: (104-152)/(52-98) 137/89 (02/24 1254) SpO2:  [97 %-100 %] 100 % (02/24 1254) Weight:  [56.2 kg-56.7 kg] 56.2 kg (02/24 1152) Last BM Date: 08/29/20 General: NAD, upright in bed eating lunch.  Well conversive   Intake/Output from previous day: No intake/output data recorded. Intake/Output this shift: Total I/O In: -  Out: 343 [Other:343]   Lab Results: Recent Labs    08/28/20 0348 08/29/20 0258 08/30/20 0231  WBC 29.0* 27.3* 18.0*  HGB 8.7* 7.8* 7.0*  PLT 431* 417* 414*  MCV 89.1 89.5 91.2   BMET Recent Labs    08/28/20 0707 08/30/20 0927  NA 133* 137  K 4.6 4.2  CL 96* 94*  CO2 23 24  GLUCOSE 91 93  BUN 49* 48*  CREATININE 7.81* 7.40*  CALCIUM 8.0* 8.7*   LFT Recent Labs    08/28/20 0707 08/30/20 0927  ALBUMIN 2.4* 2.2*   PT/INR No results for input(s): INR in the last 72 hours.    Imaging/Other results: CT Angio Abd/Pel w/ and/or w/o  Result Date: 08/30/2020 CLINICAL DATA:  75 year old with intestinal malabsorption. Abdominal pain. Diarrhea. Evaluate for mesenteric ischemia. History of rectal cancer. EXAM: CTA ABDOMEN AND PELVIS WITHOUT AND WITH CONTRAST TECHNIQUE: Multidetector CT imaging of the abdomen and pelvis was performed using the standard protocol during bolus administration of intravenous contrast. Multiplanar reconstructed  images and MIPs were obtained and reviewed to evaluate the vascular anatomy. CONTRAST:  74mL OMNIPAQUE IOHEXOL 350 MG/ML SOLN COMPARISON:  CT abdomen and pelvis 08/24/2020 and chest CT 03/14/2020 FINDINGS: VASCULAR Aorta: Normal caliber of the visualized thoracic aorta. Abdominal aorta has mild atherosclerotic disease without aneurysm or dissection. Celiac: Atherosclerotic calcifications at the origin without significant stenosis. Main branch vessels are patent without evidence of aneurysm. SMA: SMA is widely patent. Variant anatomy with a replaced right hepatic artery. Main SMA branches are patent. Renals: Bilateral renal arteries are patent. At least mild stenosis involving the proximal right renal artery. No evidence for renal artery aneurysm. IMA: Origin of the IMA appears to be occluded. There is flow in the distal IMA branches through collateral flow from the SMA. Inflow: Atherosclerotic plaque in the iliac arteries. The common iliac arteries are patent without significant stenosis. Focal narrowing in the distal left external iliac artery causing mild-to-moderate narrowing. Right external iliac artery is patent without significant stenosis. Atherosclerotic plaque and high-grade stenosis involving the proximal internal iliac arteries bilaterally. Proximal Outflow: Proximal femoral arteries are patent bilaterally. Veins: Portal venous system is patent. SMV is patent. IVC is small but patent. Iliac veins are patent. No gross venous abnormality. Review of the MIP images confirms the above findings. NON-VASCULAR Lower chest: Visualized pulmonary arteries are patent. Heart size is normal without significant pericardial effusion. Distal esophagus is distended with fluid. Patient has had previous left lung surgery. Chronic soft tissue thickening or nodularity in the left  upper chest on sequence 1, image 12 is similar to the chest CT from 09/12/2019. Stable pleural-based thickening with calcifications in the anterior  left chest. Peripheral densities in left lower lobe are most compatible with atelectasis. Stable pleural-based calcifications in the anterior right chest. Bandlike density in the right lower lobe is suggestive for atelectasis. No large pleural effusions. Hepatobiliary: Indeterminate 7 mm hypodensity in the right hepatic lobe on sequence 11, image 25. No suspicious hepatic lesions. Normal appearance of the gallbladder. No biliary dilatation. Pancreas: Unremarkable. No pancreatic ductal dilatation or surrounding inflammatory changes. Spleen: Normal in size without focal abnormality. Adrenals/Urinary Tract: Normal appearance of both adrenal glands. Again noted are hyperdense and hypodense lesions in both kidneys. Largest hyperdense area in the right kidney lower pole measures up to 4.4 cm and Hounsfield units do not significant change between the arterial and venous phase. Largest hyperdense lesion on the left measures up to 2.6 cm and Hounsfield units do not significantly change between the arterial and venous phase images. No hydronephrosis. Normal appearance of the urinary bladder. Stomach/Bowel: Since 08/24/2020, there has been increased distension of the stomach. Increased colonic distension and increased small bowel distention particularly in the lower abdomen. Again noted is perirectal stranding and edema. Small amount of presacral edema. Moderate amount of stool like material in the rectum. Dilated loop of small bowel in the left lower quadrant measures up to 3.7 cm. No focal wall thickening. Evidence for a bowel anastomosis in the right anterior abdomen on sequence 5, image 90. Lymphatic: No significant lymph node enlargement in the abdomen or pelvis. Reproductive: Prostate is unremarkable. Other: Mild subcutaneous edema. No significant ascites. There may be trace edema in the anterior abdomen on sequence 5, image 78. Musculoskeletal: Stable sclerosis in the superior left femoral head could represent a small  focus of avascular necrosis. Bridging osteophytes along the right side of the thoracic spine. IMPRESSION: VASCULAR 1. 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 There appears to be distal reconstitution of the IMA distribution. Celiac artery and SMA are patent without significant stenosis. 2. Mild atherosclerotic disease in the abdominal aorta without aneurysm. 3. Severe stenosis involving the proximal internal iliac arteries bilaterally. Mild-to-moderate narrowing in the left external iliac artery. NON-VASCULAR 1. Increased distension of the stomach, small bowel and colon since 08/24/2020. Based on the history of diarrhea, findings are unlikely related to an obstructive process. Persistent perirectal stranding and edema that could represent inflammatory changes near the rectum. Overall, no significant bowel wall thickening. 2. Dilatation of the distal esophagus related to the gastric distension. 3. Again noted are numerous hyperdense and hypodense renal lesions. The hyperdense lesions are similar to the recent noncontrast examination and these hyperdense lesions do not significantly change in Hounsfield units between the arterial and venous phase. Technically, these are indeterminate lesions but suspect these represent hemorrhagic or proteinaceous cysts. 4. Stable nodularity in the left upper chest. Electronically Signed   By: Markus Daft M.D.   On: 08/30/2020 11:15      Assessment and Plan:  1) C. difficile colitis 2) Hematochezia 3) Acute blood loss anemia -Continue vancomycin and Florastor -H/H 7/21.7.  1 unit PRBC transfused during HD today  4) Nausea/Vomiting 5) Abdominal pain -Much improved today.  Tolerating liquid diet -CTA completed and notable for occlusion of IMA, but with good collateral circulation from SMA.  Results reviewed with IR.  CT with findings suspicious for ileus including dilated colon with tapering in the sigmoid without transition/obstruction.  Also with dilated  stomach and fluid in esophagus.  Based on CT findings, had initially planned for NGT placement with EGD and unprepped colonoscopy tomorrow for diagnostic and potentially therapeutic intent.  However, as he is much improved today clinically and tolerating liquids, can move endoscopy to standby for now -Continue with clears for now -Continue Phenergan and Zofran prn -Continue Levsin -If return of symptoms, low threshold for EGD and colonoscopy (would only plan for Dulcolax and enemas as I do not think he would tolerate bowel prep and prep itself could present a risk given UGI findings on CT  6) A. fib -Anticoagulation on hold  7) ESRD -Went for HD earlier today  GI service will continue to follow.  Lavena Bullion, DO  08/30/2020, 2:19 PM [facility] Pager [phone]

## 2020-08-30 NOTE — Progress Notes (Signed)
[facility] Progress Note   Subjective:   Patient seen and examined at bedside during dialysis.  Continued to have n/v all day yesterday and throughout the night.  Unable to hold anything down.  Small amount of bloody stools noted by nurse.  Denies CP and SOB.  Hgb drop to 7.0 this AM.   Objective Vitals:   08/30/20 0930 08/30/20 1000 08/30/20 1029 08/30/20 1044  BP: 128/85 (!) 104/52 104/65 112/75  Pulse: (!) 110 (!) 120  78  Resp:   17 17  Temp:   97.9 F (36.6 C) 98 F (36.7 C)  TempSrc:   Oral Oral  SpO2:      Weight:      Height:       Physical Exam General:chronicially ill appearing, frail, elderly male in NAD Heart: +tachycardia, RR Lungs:CTAB, breath sounds diminished Abdomen:soft, +tenderness to palpation Extremities:no LE edema Dialysis Access: LU AVF cannulated   Filed Weights   08/28/20 0840 08/28/20 1210 08/30/20 0844  Weight: 59.2 kg 58.8 kg 56.7 kg   No intake or output data in the 24 hours ending 08/30/20 1045  Additional Objective Labs: Basic Metabolic Panel: Recent Labs  Lab 08/25/20 1413 08/28/20 0707 08/30/20 0927  NA 133* 133* 137  K 5.3* 4.6 4.2  CL 96* 96* 94*  CO2 19* 23 24  GLUCOSE 74 91 93  BUN 55* 49* 48*  CREATININE 9.76* 7.81* 7.40*  CALCIUM 7.5* 8.0* 8.7*  PHOS 5.9* 5.1* 6.3*   Liver Function Tests: Recent Labs  Lab 08/24/20 1511 08/25/20 1006 08/25/20 1413 08/28/20 0707 08/30/20 0927  AST 15 12*  --   --   --   ALT 9 9  --   --   --   ALKPHOS 99 82  --   --   --   BILITOT 0.7 0.9  --   --   --   PROT 7.1 5.2*  --   --   --   ALBUMIN 3.3* 2.3* 2.3* 2.4* 2.2*   CBC: Recent Labs  Lab 08/26/20 0649 08/27/20 0545 08/28/20 0348 08/29/20 0258 08/30/20 0231  WBC 35.7* 27.3* 29.0* 27.3* 18.0*  NEUTROABS 33.2* 24.2* 25.3* 24.9* 15.4*  HGB 8.6* 7.8* 8.7* 7.8* 7.0*  HCT 24.3* 23.4* 25.3* 23.1* 21.7*  MCV 85.6 88.6 89.1 89.5 91.2  PLT 323 361 431* 417* 414*   CBG: Recent Labs  Lab 08/28/20 1629  08/29/20 0655  GLUCAP 80 92    Medications:  . sodium chloride   Intravenous Once  . bisacodyl  10 mg Oral Q6H  . Chlorhexidine Gluconate Cloth  6 each Topical Q0600  . Chlorhexidine Gluconate Cloth  6 each Topical Q0600  . darbepoetin (ARANESP) injection - DIALYSIS  60 mcg Intravenous Q Sat-HD  . doxercalciferol  7 mcg Intravenous Q T,Th,Sa-HD  . niacin  50 mg Oral TID WC  . pantoprazole  40 mg Oral BID  . saccharomyces boulardii  250 mg Oral BID  . vancomycin  500 mg Oral QID    Dialysis Orders: [facility] 4h 450/500 58kg 3K/2Ca UFP2 L AVF No heparin  Mircera 50 (to start 2/22) Venofer 50  Hectorol 7   Assessment/Plan: 1. BRBPR/ABLA Hx GI bleed/rectal ca, supra therapeutic INR on admit (during previous admissionEGD2/9with duodenal ulcer, treated w/ clip+ injection)On Protonix. S/p 2u prbcs 2/19. Hgb up and down, dropped to 7.0 today, 1unit pRBC ordered with HD.CTA abd/pelvis competed overnight, results pending. Plan for colonoscopy tomorrow. Per Primary/GI. 2. Nausea/vomiting - ongoing.  Per primary/GI following.  3. ESRD - HD TTS.HD today per regular schedule. last K 4.2. 4. C. Diff colitis. On PO Vanc/Florastor. CTA abd/pelvis completed overnight, results pending.  Plan for colonoscopy tomorrow. 5. AFib- on coumadin. Supratherapeutic INR on admission.Reversed with Vit K. Coumadinheld. 6. HTNVolume - BPelevated.  Not on antihypertensives. Does not appear volume overloaded. UF to EDW as tolerated. 7. Anemia CKD --see #1. Hgb 7.0, continue aranesp 60qwk. 1 unit pRBC ordered with HD today.  2 units already given during admit. 8. MBD CKD --Corr Caok.  Phos elevated. Continue home binders when able to tolerate PO. Hold Sensipar for now.  9. COPD  10. H/o prior rectal cancer - in 2014 treated w/ chemoRx,radiation, laparoscopic low anterior resection with diverting loop ileostomy and subsequent ileostomy takedown.   Jen Mow, PA-C [facility] 08/30/2020,10:45 AM  LOS: 6 days

## 2020-08-30 NOTE — Progress Notes (Signed)
Patient continues to have large bloody stools,nausea and abdominal pain. Patient medicated with PRN meds and Md notified of frequent stools and need for rectal tube to help with skin care and comfort.

## 2020-08-31 ENCOUNTER — Inpatient Hospital Stay: Payer: Medicare Other | Admitting: Nurse Practitioner

## 2020-08-31 ENCOUNTER — Encounter (HOSPITAL_COMMUNITY)
Admission: EM | Disposition: A | Discharge status: Hospice | Payer: Self-pay | Source: Home / Self Care | Attending: Internal Medicine

## 2020-08-31 ENCOUNTER — Inpatient Hospital Stay (HOSPITAL_COMMUNITY): Payer: Medicare Other

## 2020-08-31 ENCOUNTER — Inpatient Hospital Stay: Payer: Medicare Other

## 2020-08-31 DIAGNOSIS — K921 Melena: Secondary | ICD-10-CM | POA: Diagnosis not present

## 2020-08-31 DIAGNOSIS — R112 Nausea with vomiting, unspecified: Secondary | ICD-10-CM | POA: Diagnosis not present

## 2020-08-31 DIAGNOSIS — R1084 Generalized abdominal pain: Secondary | ICD-10-CM

## 2020-08-31 DIAGNOSIS — A0472 Enterocolitis due to Clostridium difficile, not specified as recurrent: Secondary | ICD-10-CM | POA: Diagnosis not present

## 2020-08-31 LAB — CBC WITH DIFFERENTIAL/PLATELET
Abs Immature Granulocytes: 0.09 10*3/uL — ABNORMAL HIGH (ref 0.00–0.07)
Basophils Absolute: 0 10*3/uL (ref 0.0–0.1)
Basophils Relative: 0 %
Eosinophils Absolute: 0.1 10*3/uL (ref 0.0–0.5)
Eosinophils Relative: 1 %
HCT: 30.3 % — ABNORMAL LOW (ref 39.0–52.0)
Hemoglobin: 9.6 g/dL — ABNORMAL LOW (ref 13.0–17.0)
Immature Granulocytes: 1 %
Lymphocytes Relative: 4 %
Lymphs Abs: 0.5 10*3/uL — ABNORMAL LOW (ref 0.7–4.0)
MCH: 27.2 pg (ref 26.0–34.0)
MCHC: 31.7 g/dL (ref 30.0–36.0)
MCV: 85.8 fL (ref 80.0–100.0)
Monocytes Absolute: 1.4 10*3/uL — ABNORMAL HIGH (ref 0.1–1.0)
Monocytes Relative: 12 %
Neutro Abs: 9.6 10*3/uL — ABNORMAL HIGH (ref 1.7–7.7)
Neutrophils Relative %: 82 %
Platelets: 270 10*3/uL (ref 150–400)
RBC: 3.53 MIL/uL — ABNORMAL LOW (ref 4.22–5.81)
RDW: 23.9 % — ABNORMAL HIGH (ref 11.5–15.5)
WBC: 11.8 10*3/uL — ABNORMAL HIGH (ref 4.0–10.5)
nRBC: 0 % (ref 0.0–0.2)

## 2020-08-31 LAB — BPAM RBC
Blood Product Expiration Date: 202203132359
ISSUE DATE / TIME: 202202241024
Unit Type and Rh: 6200

## 2020-08-31 LAB — BLOOD GAS, ARTERIAL
Acid-base deficit: 1.8 mmol/L (ref 0.0–2.0)
Bicarbonate: 21.3 mmol/L (ref 20.0–28.0)
FIO2: 40
O2 Saturation: 89.7 %
Patient temperature: 36.6
pCO2 arterial: 28.8 mmHg — ABNORMAL LOW (ref 32.0–48.0)
pH, Arterial: 7.481 — ABNORMAL HIGH (ref 7.350–7.450)
pO2, Arterial: 55.2 mmHg — ABNORMAL LOW (ref 83.0–108.0)

## 2020-08-31 LAB — TYPE AND SCREEN
ABO/RH(D): AB POS
Antibody Screen: NEGATIVE
Unit division: 0

## 2020-08-31 LAB — GLUCOSE, CAPILLARY
Glucose-Capillary: 62 mg/dL — ABNORMAL LOW (ref 70–99)
Glucose-Capillary: <10 mg/dL — CL (ref 70–99)
Glucose-Capillary: 122 mg/dL — ABNORMAL HIGH (ref 70–99)
Glucose-Capillary: 95 mg/dL (ref 70–99)
Glucose-Capillary: 64 mg/dL — ABNORMAL LOW (ref 70–99)
Glucose-Capillary: 90 mg/dL (ref 70–99)
Glucose-Capillary: 97 mg/dL (ref 70–99)

## 2020-08-31 LAB — HEMOGLOBIN AND HEMATOCRIT, BLOOD
HCT: 27.2 % — ABNORMAL LOW (ref 39.0–52.0)
Hemoglobin: 8.7 g/dL — ABNORMAL LOW (ref 13.0–17.0)

## 2020-08-31 SURGERY — COLONOSCOPY WITH PROPOFOL
Anesthesia: Monitor Anesthesia Care

## 2020-08-31 MED ORDER — METOCLOPRAMIDE HCL 5 MG/ML IJ SOLN
5.0000 mg | Freq: Once | INTRAMUSCULAR | Status: AC
  Administered 2020-08-31: 5 mg via INTRAVENOUS
  Filled 2020-08-31: qty 2

## 2020-08-31 MED ORDER — METOPROLOL TARTRATE 5 MG/5ML IV SOLN
5.0000 mg | Freq: Four times a day (QID) | INTRAVENOUS | Status: DC | PRN
  Administered 2020-08-31 – 2020-09-07 (×2): 5 mg via INTRAVENOUS
  Filled 2020-08-31 (×3): qty 5

## 2020-08-31 MED ORDER — FLEET ENEMA 7-19 GM/118ML RE ENEM
1.0000 | ENEMA | RECTAL | Status: AC
  Administered 2020-09-01 (×2): 1 via RECTAL
  Filled 2020-08-31: qty 1

## 2020-08-31 MED ORDER — DEXTROSE 50 % IV SOLN
INTRAVENOUS | Status: AC
  Administered 2020-08-31: 25 mL
  Filled 2020-08-31: qty 50

## 2020-08-31 MED ORDER — CHLORHEXIDINE GLUCONATE CLOTH 2 % EX PADS
6.0000 | MEDICATED_PAD | Freq: Every day | CUTANEOUS | Status: DC
  Administered 2020-09-01 – 2020-09-02 (×2): 6 via TOPICAL

## 2020-08-31 MED ORDER — DEXTROSE 50 % IV SOLN
INTRAVENOUS | Status: AC
  Filled 2020-08-31: qty 50

## 2020-08-31 MED ORDER — FLEET ENEMA 7-19 GM/118ML RE ENEM: 1.0000 | ENEMA | Freq: Once | RECTAL | Status: DC

## 2020-08-31 MED ORDER — DEXTROSE 5 % IV SOLN: INTRAVENOUS | Status: DC

## 2020-08-31 MED ORDER — BISACODYL 5 MG PO TBEC
5.0000 mg | DELAYED_RELEASE_TABLET | Freq: Once | ORAL | Status: AC
  Administered 2020-08-31: 5 mg via ORAL
  Filled 2020-08-31: qty 1

## 2020-08-31 MED ORDER — PANTOPRAZOLE SODIUM 40 MG IV SOLR
40.0000 mg | Freq: Two times a day (BID) | INTRAVENOUS | Status: DC
  Administered 2020-08-31 – 2020-09-06 (×11): 40 mg via INTRAVENOUS
  Filled 2020-08-31 (×11): qty 40

## 2020-08-31 MED ORDER — DEXTROSE 50 % IV SOLN
50.0000 mL | Freq: Once | INTRAVENOUS | Status: AC
  Administered 2020-08-31: 50 mL via INTRAVENOUS

## 2020-08-31 NOTE — Progress Notes (Signed)
Attempted to place NG tube twice. When placed to suction, could be heard from his throat both times without suction of any gastric contents. RN will get KUB to confirm. Recommend getting an order for IR to place if possible since this is the patient's third attempt by 2 different nurses.

## 2020-08-31 NOTE — Progress Notes (Addendum)
Attending physician's note   I have taken an interval history, reviewed the chart and examined the patient. I agree with the Advanced Practitioner's note, impression, and recommendations as outlined.   Despite feeling better yesterday, he again had nausea/vomiting overnight and today.  Abdominal pain has returned.  Still with bloody stool this morning.  Staff attempted rectal tube for patient comfort and skin breakdown, but patient did not tolerate.  WBC improved to 11.8 today (18 yesterday).  Transfused 1 unit PRBCs yesterday for hemoglobin 7, with repeat 9.6 this morning.  -NGT today -Change to n.p.o. -EGD/colonoscopy tomorrow for diagnostic and potentially therapeutic intent -Do not think he be able to tolerate bowel prep, along with concerns for aspiration of prep given CT findings.  Therefore only Dulcolax and enema in preparation for colonoscopy -Continue vancomycin and [place], DO, FACG [phone] office            Progress Note  Chief Complaint:   Ongoing blood in stool, nausea and vomiting today.      ASSESSMENT / PLAN:    # C.diff colitis, improving. Frequency of diarrhea improving and no bowel wall thickening on on CTA two days ago, --WBC down to 11.8 --Continue Vancomycin (on day # 7). Continue Florastor  # Hematochezia. Extended hospitalization for ongoing hematochezia. CTA abd/pelvis yesterday to evaluate for mesenteric ischemia, IMA occluded but celiac and SMA are patent. He had blood in stool last night and again this am. Etiology still unclear.  --EGD / colonoscopy were cancelled due to clinical improvement. With ongoing bleeding will plan for procedures tomorrow. EGD and colonoscopy tomorrow. The risks and benefits of EGD and colonoscopy with possible polypectomy / biopsies were discussed and the patient agrees to proceed.  --He is unable to complete a bowel prep due to nausea, vomiting, what appears to be generalized ileus and gastric  distention on CT scan. Will give PO dulcolax and fleets enemas instead.    # Acute blood loss anemia secondary to ongoing hematochezi . Received another unit of blood yesterday for hgb of 7 ( 3rd unit this admission) . Hgb 9.6 today. Getting Aranesp.    # Gastric distention, small bowel and colonic distention on CTA 08/29/20. Also dilation of distal esophagus.   --Patient belching, having nausea, vomiting this am.  --Place NGT to LIS --IV reglan now.    # ESRD on HD      SUBJECTIVE:   Belching, vomiting this am.     OBJECTIVE:    Scheduled inpatient medications:  . sodium chloride   Intravenous Once  . Chlorhexidine Gluconate Cloth  6 each Topical Q0600  . Chlorhexidine Gluconate Cloth  6 each Topical Q0600  . darbepoetin (ARANESP) injection - DIALYSIS  60 mcg Intravenous Q Sat-HD  . doxercalciferol  7 mcg Intravenous Q T,Th,Sa-HD  . niacin  50 mg Oral TID WC  . pantoprazole  40 mg Oral BID  . saccharomyces boulardii  250 mg Oral BID  . vancomycin  500 mg Oral QID   Continuous inpatient infusions:  PRN inpatient medications: acetaminophen **OR** acetaminophen, HYDROmorphone (DILAUDID) injection, hyoscyamine, ondansetron (ZOFRAN) IV, promethazine  Vital signs in last 24 hours: Temp:  [97.8 F (36.6 C)-98.4 F (36.9 C)] 98 F (36.7 C) (02/25 0727) Pulse Rate:  [69-124] 98 (02/25 0727) Resp:  [15-18] 15 (02/25 0727) BP: (108-159)/(56-89) 159/78 (02/25 0727) SpO2:  [97 %-100 %] 98 % (02/25 0727) Weight:  [56.2 kg] 56.2 kg (02/24 1152) Last BM Date: 08/29/20  Intake/Output Summary (  Last 24 hours) at 08/31/2020 1047 Last data filed at 08/31/2020 0900 Gross per 24 hour  Intake 120 ml  Output 343 ml  Net -223 ml     Physical Exam:  General: Alert thin male in NAD. Wife at bedside Heart:  Regular rate and rhythm. No lower extremity edema Pulmonary: Normal respiratory effort Abdomen: Soft, mildly distended with active bowel sounds. He has diffuse abdominal tenderness.    Neurologic: Alert and oriented Psych: Pleasant. Cooperative.   Filed Weights   08/28/20 1210 08/30/20 0844 08/30/20 1152  Weight: 58.8 kg 56.7 kg 56.2 kg    Intake/Output from previous day: 02/24 0701 - 02/25 0700 In: -  Out: 343  Intake/Output this shift: Total I/O In: 120 [P.O.:120] Out: -     Lab Results: Recent Labs    08/29/20 0258 08/30/20 0231 08/31/20 0203  WBC 27.3* 18.0* 11.8*  HGB 7.8* 7.0* 9.6*  HCT 23.1* 21.7* 30.3*  PLT 417* 414* 270   BMET Recent Labs    08/30/20 0927  NA 137  K 4.2  CL 94*  CO2 24  GLUCOSE 93  BUN 48*  CREATININE 7.40*  CALCIUM 8.7*   LFT Recent Labs    08/30/20 0927  ALBUMIN 2.2*   PT/INR No results for input(s): LABPROT, INR in the last 72 hours. Hepatitis Panel No results for input(s): HEPBSAG, HCVAB, HEPAIGM, HEPBIGM in the last 72 hours.  CT Angio Abd/Pel w/ and/or w/o  Result Date: 08/30/2020 CLINICAL DATA:  75 year old with intestinal malabsorption. Abdominal pain. Diarrhea. Evaluate for mesenteric ischemia. History of rectal cancer. EXAM: CTA ABDOMEN AND PELVIS WITHOUT AND WITH CONTRAST TECHNIQUE: Multidetector CT imaging of the abdomen and pelvis was performed using the standard protocol during bolus administration of intravenous contrast. Multiplanar reconstructed images and MIPs were obtained and reviewed to evaluate the vascular anatomy. CONTRAST:  79mL OMNIPAQUE IOHEXOL 350 MG/ML SOLN COMPARISON:  CT abdomen and pelvis 08/24/2020 and chest CT 03/14/2020 FINDINGS: VASCULAR Aorta: Normal caliber of the visualized thoracic aorta. Abdominal aorta has mild atherosclerotic disease without aneurysm or dissection. Celiac: Atherosclerotic calcifications at the origin without significant stenosis. Main branch vessels are patent without evidence of aneurysm. SMA: SMA is widely patent. Variant anatomy with a replaced right hepatic artery. Main SMA branches are patent. Renals: Bilateral renal arteries are patent. At least mild  stenosis involving the proximal right renal artery. No evidence for renal artery aneurysm. IMA: Origin of the IMA appears to be occluded. There is flow in the distal IMA branches through collateral flow from the SMA. Inflow: Atherosclerotic plaque in the iliac arteries. The common iliac arteries are patent without significant stenosis. Focal narrowing in the distal left external iliac artery causing mild-to-moderate narrowing. Right external iliac artery is patent without significant stenosis. Atherosclerotic plaque and high-grade stenosis involving the proximal internal iliac arteries bilaterally. Proximal Outflow: Proximal femoral arteries are patent bilaterally. Veins: Portal venous system is patent. SMV is patent. IVC is small but patent. Iliac veins are patent. No gross venous abnormality. Review of the MIP images confirms the above findings. NON-VASCULAR Lower chest: Visualized pulmonary arteries are patent. Heart size is normal without significant pericardial effusion. Distal esophagus is distended with fluid. Patient has had previous left lung surgery. Chronic soft tissue thickening or nodularity in the left upper chest on sequence 1, image 12 is similar to the chest CT from 09/12/2019. Stable pleural-based thickening with calcifications in the anterior left chest. Peripheral densities in left lower lobe are most compatible with atelectasis. Stable pleural-based  calcifications in the anterior right chest. Bandlike density in the right lower lobe is suggestive for atelectasis. No large pleural effusions. Hepatobiliary: Indeterminate 7 mm hypodensity in the right hepatic lobe on sequence 11, image 25. No suspicious hepatic lesions. Normal appearance of the gallbladder. No biliary dilatation. Pancreas: Unremarkable. No pancreatic ductal dilatation or surrounding inflammatory changes. Spleen: Normal in size without focal abnormality. Adrenals/Urinary Tract: Normal appearance of both adrenal glands. Again noted  are hyperdense and hypodense lesions in both kidneys. Largest hyperdense area in the right kidney lower pole measures up to 4.4 cm and Hounsfield units do not significant change between the arterial and venous phase. Largest hyperdense lesion on the left measures up to 2.6 cm and Hounsfield units do not significantly change between the arterial and venous phase images. No hydronephrosis. Normal appearance of the urinary bladder. Stomach/Bowel: Since 08/24/2020, there has been increased distension of the stomach. Increased colonic distension and increased small bowel distention particularly in the lower abdomen. Again noted is perirectal stranding and edema. Small amount of presacral edema. Moderate amount of stool like material in the rectum. Dilated loop of small bowel in the left lower quadrant measures up to 3.7 cm. No focal wall thickening. Evidence for a bowel anastomosis in the right anterior abdomen on sequence 5, image 90. Lymphatic: No significant lymph node enlargement in the abdomen or pelvis. Reproductive: Prostate is unremarkable. Other: Mild subcutaneous edema. No significant ascites. There may be trace edema in the anterior abdomen on sequence 5, image 78. Musculoskeletal: Stable sclerosis in the superior left femoral head could represent a small focus of avascular necrosis. Bridging osteophytes along the right side of the thoracic spine. IMPRESSION: VASCULAR 1. 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 There appears to be distal reconstitution of the IMA distribution. Celiac artery and SMA are patent without significant stenosis. 2. Mild atherosclerotic disease in the abdominal aorta without aneurysm. 3. Severe stenosis involving the proximal internal iliac arteries bilaterally. Mild-to-moderate narrowing in the left external iliac artery. NON-VASCULAR 1. Increased distension of the stomach, small bowel and colon since 08/24/2020. Based on the history of diarrhea, findings are unlikely  related to an obstructive process. Persistent perirectal stranding and edema that could represent inflammatory changes near the rectum. Overall, no significant bowel wall thickening. 2. Dilatation of the distal esophagus related to the gastric distension. 3. Again noted are numerous hyperdense and hypodense renal lesions. The hyperdense lesions are similar to the recent noncontrast examination and these hyperdense lesions do not significantly change in Hounsfield units between the arterial and venous phase. Technically, these are indeterminate lesions but suspect these represent hemorrhagic or proteinaceous cysts. 4. Stable nodularity in the left upper chest. Electronically Signed   By: Markus Daft M.D.   On: 08/30/2020 11:15    PREVIOUS ENDOSCOPIES:    Active Problems:   Hematochezia   GIB (gastrointestinal bleeding)   C. difficile colitis   Generalized abdominal pain   Non-intractable vomiting     LOS: 7 days   Tye Savoy ,NP 08/31/2020, 10:47 AM

## 2020-08-31 NOTE — Progress Notes (Signed)
Attempted to insert rectal tube per Dr orders. Patient unable to tolerate insertion.

## 2020-08-31 NOTE — Plan of Care (Signed)
  Problem: Nutrition: Goal: Adequate nutrition will be maintained Outcome: Progressing   Problem: Activity: Goal: Risk for activity intolerance will decrease Outcome: Progressing   Problem: Pain Managment: Goal: General experience of comfort will improve Outcome: Progressing

## 2020-08-31 NOTE — Progress Notes (Signed)
Pt received from 5N. HR 156. BP 103/43. Oxygen 97 on 6L [place]. CHG completed. Telemetry applied. 5mg  IV metoprolol given and HR decreased to 115. Red MEWS protocol initiated. Patient alert and oriented. Patients wife called and updated on patients new room. Call light in reach.  Clyde Canterbury, RN

## 2020-08-31 NOTE — Progress Notes (Signed)
PROGRESS NOTE    Jason DORO Sr.  BJY:[id number] DOB: 1945-08-28 DOA: 08/24/2020 PCP: [name], MD    Brief Narrative:  75 year old gentleman with history of chronic A. fib on Coumadin, peptic ulcer disease, ESRD on hemodialysis TTS schedule who presented to the ER with abdominal cramping, nausea vomiting diarrhea and rectal bleeding.  Patient was recently in the hospital 2/9-2/13 with upper GI bleeding and found to have bleeding duodenal ulcer treated with epinephrine injection and clipping.  Discharged on Protonix twice daily.Patient also has chronic diarrhea.  He has history of rectal cancer with resection.  Admitted with rectal bleeding, C. difficile infection and diarrhea and anemia.  Patient was hemodynamically stable in the ER. Admitted with C. diff, rectal bleeding. Followed by GI. Patient with persistent abdominal pain and nausea and vomiting.  Assessment & Plan:   Active Problems:   Hematochezia   GIB (gastrointestinal bleeding)   C. difficile colitis   Generalized abdominal pain   Non-intractable vomiting  C. difficile colitis with diarrhea and abdominal pain: CT scan with evidence of proctitis.  Patient has history of low anterior resection as well as ileostomy and reversal. Recent colonoscopies were significantly normal with evidence of diverticulosis. With worsening WBC count and abdominal pain, treated with higher dose of oral vancomycin.  CTA with SMA blockage, however with good collaterals.  No evidence of acute ischemia. Patient continues to have intermittent abdominal pain, not tolerating diet.   With persistent pain, distention and bloody bowel movements plan for EGD and colonoscopy tomorrow with NG tube decompression and Dulcolax evacuation.  Lower GI bleeding, painless rectal bleeding: Anemia of acute blood loss. Suspect outlet bleeding from proctitis/radiation injury with ongoing diarrhea and supratherapeutic INR. Currently hemodynamically  stabilizing. Supratherapeutic INR, presented with 8.8-aggressively reversed with 2 units of FFP, IV vitamin K. Coumadin on hold. Hemoglobin 8.9-7.6-6.8-1 unit of transfusion-8.1-1 unit of transfusion 8.6-7 Received 1 unit PRBC 2/24. On oral Protonix for upper GI bleeding. EGD colonoscopy tomorrow.  Acquired thrombophilia, chronic A. fib on Coumadin with supratherapeutic INR: Presented with INR of 8.8, on Coumadin and probably supratherapeutic due to acute illnesses. Aggressively reversed. Continue to hold Coumadin, no need for further reversal. A. fib is rate controlled.  ESRD on hemodialysis: Stable.  Getting dialysis on his schedule.  Suspected B3 deficiency: Patient recently had vitamin B3 level checked, niacin level 30.  Does not have other evidence of niacin deficiency.  We will put patient on trial of niacin replacement.  No contraindications.  DVT prophylaxis: SCDs Start: 08/24/20 2254   Code Status: Full code Family Communication: [name] at the bedside. Disposition Plan: Status is: Inpatient  Remains inpatient appropriate because:Persistent severe electrolyte disturbances and IV treatments appropriate due to intensity of illness or inability to take PO   Dispo: The patient is from: Home              Anticipated d/c is to: SNF              Anticipated d/c date is: 2 days              Patient currently is not medically stable to d/c.   Difficult to place patient No  Consultants:   Gastroenterology  Nephrology  Procedures:   Routine dialysis  Antimicrobials:   Vancomycin by mouth 2/18---   Subjective: Patient seen and examined.  Continues to have abdominal pain and distention.  Complains of episodic severe pain on the upper abdomen.  Does not have active nausea, however  he is not even able to take liquid diet.  He had 2 episodes of bloody bowel movement overnight.  Wife at the bedside.     Objective: Vitals:   08/30/20 1152 08/30/20 1254 08/30/20  1659 08/31/20 0727  BP: (!) 114/56 137/89 129/85 (!) 159/78  Pulse: 69 (!) 106 (!) 102 98  Resp: 18 18 18 15   Temp: 97.8 F (36.6 C) 98.4 F (36.9 C) 98.2 F (36.8 C) 98 F (36.7 C)  TempSrc: Oral Oral Oral Oral  SpO2: 97% 100% 97% 98%  Weight: 56.2 kg     Height:        Intake/Output Summary (Last 24 hours) at 08/31/2020 1302 Last data filed at 08/31/2020 0900 Gross per 24 hour  Intake 120 ml  Output -  Net 120 ml   Filed Weights   08/28/20 1210 08/30/20 0844 08/30/20 1152  Weight: 58.8 kg 56.7 kg 56.2 kg    Examination: General: Sick looking, frail and debilitated.  Anxious. Cardiovascular: S1-S2 normal. Respiratory: Bilateral clear. Gastrointestinal: Moderate generalized tenderness along the epigastrium and right upper quadrant.   No rigidity or guarding.  Bowel sounds present. Ext: Generalized weakness. Neuro: No focal deficits.     Data Reviewed: I have personally reviewed following labs and imaging studies  CBC: Recent Labs  Lab 08/27/20 0545 08/28/20 0348 08/29/20 0258 08/30/20 0231 08/31/20 0203  WBC 27.3* 29.0* 27.3* 18.0* 11.8*  NEUTROABS 24.2* 25.3* 24.9* 15.4* 9.6*  HGB 7.8* 8.7* 7.8* 7.0* 9.6*  HCT 23.4* 25.3* 23.1* 21.7* 30.3*  MCV 88.6 89.1 89.5 91.2 85.8  PLT 361 431* 417* 414* 539   Basic Metabolic Panel: Recent Labs  Lab 08/24/20 1511 08/25/20 1006 08/25/20 1413 08/26/20 0649 08/28/20 0707 08/30/20 0927  NA 133* 133* 133*  --  133* 137  K 4.7 5.5* 5.3*  --  4.6 4.2  CL 91* 97* 96*  --  96* 94*  CO2 25 22 19*  --  23 24  GLUCOSE 87 76 74  --  91 93  BUN 45* 52* 55*  --  49* 48*  CREATININE 9.08* 9.59* 9.76*  --  7.81* 7.40*  CALCIUM 7.9* 7.2* 7.5*  --  8.0* 8.7*  MG  --   --   --  1.8  --   --   PHOS  --   --  5.9*  --  5.1* 6.3*   GFR: Estimated Creatinine Clearance: 7.1 mL/min (A) (by C-G formula based on SCr of 7.4 mg/dL (H)). Liver Function Tests: Recent Labs  Lab 08/24/20 1511 08/25/20 1006 08/25/20 1413  08/28/20 0707 08/30/20 0927  AST 15 12*  --   --   --   ALT 9 9  --   --   --   ALKPHOS 99 82  --   --   --   BILITOT 0.7 0.9  --   --   --   PROT 7.1 5.2*  --   --   --   ALBUMIN 3.3* 2.3* 2.3* 2.4* 2.2*   No results for input(s): LIPASE, AMYLASE in the last 168 hours. No results for input(s): AMMONIA in the last 168 hours. Coagulation Profile: Recent Labs  Lab 08/24/20 1536 08/25/20 1006 08/26/20 0649  INR 8.8* 2.6* 1.8*   Cardiac Enzymes: No results for input(s): CKTOTAL, CKMB, CKMBINDEX, TROPONINI in the last 168 hours. BNP (last 3 results) No results for input(s): PROBNP in the last 8760 hours. HbA1C: No results for input(s): HGBA1C in the last 72  hours. CBG: Recent Labs  Lab 08/28/20 1629 08/29/20 0655  GLUCAP 80 92   Lipid Profile: No results for input(s): CHOL, HDL, LDLCALC, TRIG, CHOLHDL, LDLDIRECT in the last 72 hours. Thyroid Function Tests: No results for input(s): TSH, T4TOTAL, FREET4, T3FREE, THYROIDAB in the last 72 hours. Anemia Panel: No results for input(s): VITAMINB12, FOLATE, FERRITIN, TIBC, IRON, RETICCTPCT in the last 72 hours. Sepsis Labs: No results for input(s): PROCALCITON, LATICACIDVEN in the last 168 hours.  Recent Results (from the past 240 hour(s))  Ova and parasite examination     Status: None   Collection Time: 08/23/20  3:21 PM   Specimen: Stool  Result Value Ref Range Status   MICRO NUMBER: [id number]  Final   SPECIMEN QUALITY: Adequate  Final   Source STOOL  Final   STATUS: FINAL  Final   CONCENTRATE RESULT: No ova or parasites seen  Final   TRICHROME RESULT: No ova or parasites seen  Final   COMMENT:   Final    Routine Ova and Parasite exam may not detect some parasites that occasionally cause diarrheal illness. Cryptosporidium Antigen and/or Cyclospora and Isospora Exam may be ordered to detect these parasites. One negative sample does not necessarily rule out  the presence of a parasitic infection.  For additional information,  please refer to https://education.quest[employer].com/faq/FAQ203 (This link is being provided for informational/ educational purposes only.)   Resp Panel by RT-PCR (Flu A&B, Covid) Nasopharyngeal Swab     Status: None   Collection Time: 08/24/20  3:40 PM   Specimen: Nasopharyngeal Swab; Nasopharyngeal(NP) swabs in vial transport medium  Result Value Ref Range Status   SARS Coronavirus 2 by RT PCR NEGATIVE NEGATIVE Final    Comment: (NOTE) SARS-CoV-2 target nucleic acids are NOT DETECTED.  The SARS-CoV-2 RNA is generally detectable in upper respiratory specimens during the acute phase of infection. The lowest concentration of SARS-CoV-2 viral copies this assay can detect is 138 copies/mL. A negative result does not preclude SARS-Cov-2 infection and should not be used as the sole basis for treatment or other patient management decisions. A negative result may occur with  improper specimen collection/handling, submission of specimen other than nasopharyngeal swab, presence of viral mutation(s) within the areas targeted by this assay, and inadequate number of viral copies(<138 copies/mL). A negative result must be combined with clinical observations, patient history, and epidemiological information. The expected result is Negative.  Fact Sheet for Patients:  EntrepreneurPulse.com.au  Fact Sheet for Healthcare Providers:  IncredibleEmployment.be  This test is no t yet approved or cleared by the [place] FDA and  has been authorized for detection and/or diagnosis of SARS-CoV-2 by FDA under an Emergency Use Authorization (EUA). This EUA will remain  in effect (meaning this test can be used) for the duration of the COVID-19 declaration under Section 564(b)(1) of the Act, 21 U.S.C.section 360bbb-3(b)(1), unless the authorization is terminated  or revoked sooner.       Influenza A by PCR NEGATIVE NEGATIVE Final   Influenza B by PCR NEGATIVE  NEGATIVE Final    Comment: (NOTE) The Xpert Xpress SARS-CoV-2/FLU/RSV plus assay is intended as an aid in the diagnosis of influenza from Nasopharyngeal swab specimens and should not be used as a sole basis for treatment. Nasal washings and aspirates are unacceptable for Xpert Xpress SARS-CoV-2/FLU/RSV testing.  Fact Sheet for Patients: EntrepreneurPulse.com.au  Fact Sheet for Healthcare Providers: IncredibleEmployment.be  This test is not yet approved or cleared by the [place] and has been authorized for  detection and/or diagnosis of SARS-CoV-2 by FDA under an Emergency Use Authorization (EUA). This EUA will remain in effect (meaning this test can be used) for the duration of the COVID-19 declaration under Section 564(b)(1) of the Act, 21 U.S.C. section 360bbb-3(b)(1), unless the authorization is terminated or revoked.  Performed at [facility], [place] [address], [place], [place] [address]   MRSA PCR Screening     Status: None   Collection Time: 08/26/20  8:05 AM   Specimen: Nasal Mucosa; Nasopharyngeal  Result Value Ref Range Status   MRSA by PCR NEGATIVE NEGATIVE Final    Comment:        The GeneXpert MRSA Assay (FDA approved for NASAL specimens only), is one component of a comprehensive MRSA colonization surveillance program. It is not intended to diagnose MRSA infection nor to guide or monitor treatment for MRSA infections. Performed at [facility], [place] [address], [place], [place] [address]          Radiology Studies: CT Angio Abd/Pel w/ and/or w/o  Result Date: 08/30/2020 CLINICAL DATA:  75 year old with intestinal malabsorption. Abdominal pain. Diarrhea. Evaluate for mesenteric ischemia. History of rectal cancer. EXAM: CTA ABDOMEN AND PELVIS WITHOUT AND WITH CONTRAST TECHNIQUE: Multidetector CT imaging of the abdomen and pelvis was performed using the standard protocol during bolus  administration of intravenous contrast. Multiplanar reconstructed images and MIPs were obtained and reviewed to evaluate the vascular anatomy. CONTRAST:  54mL OMNIPAQUE IOHEXOL 350 MG/ML SOLN COMPARISON:  CT abdomen and pelvis 08/24/2020 and chest CT 03/14/2020 FINDINGS: VASCULAR Aorta: Normal caliber of the visualized thoracic aorta. Abdominal aorta has mild atherosclerotic disease without aneurysm or dissection. Celiac: Atherosclerotic calcifications at the origin without significant stenosis. Main branch vessels are patent without evidence of aneurysm. SMA: SMA is widely patent. Variant anatomy with a replaced right hepatic artery. Main SMA branches are patent. Renals: Bilateral renal arteries are patent. At least mild stenosis involving the proximal right renal artery. No evidence for renal artery aneurysm. IMA: Origin of the IMA appears to be occluded. There is flow in the distal IMA branches through collateral flow from the SMA. Inflow: Atherosclerotic plaque in the iliac arteries. The common iliac arteries are patent without significant stenosis. Focal narrowing in the distal left external iliac artery causing mild-to-moderate narrowing. Right external iliac artery is patent without significant stenosis. Atherosclerotic plaque and high-grade stenosis involving the proximal internal iliac arteries bilaterally. Proximal Outflow: Proximal femoral arteries are patent bilaterally. Veins: Portal venous system is patent. SMV is patent. IVC is small but patent. Iliac veins are patent. No gross venous abnormality. Review of the MIP images confirms the above findings. NON-VASCULAR Lower chest: Visualized pulmonary arteries are patent. Heart size is normal without significant pericardial effusion. Distal esophagus is distended with fluid. Patient has had previous left lung surgery. Chronic soft tissue thickening or nodularity in the left upper chest on sequence 1, image 12 is similar to the chest CT from 09/12/2019.  Stable pleural-based thickening with calcifications in the anterior left chest. Peripheral densities in left lower lobe are most compatible with atelectasis. Stable pleural-based calcifications in the anterior right chest. Bandlike density in the right lower lobe is suggestive for atelectasis. No large pleural effusions. Hepatobiliary: Indeterminate 7 mm hypodensity in the right hepatic lobe on sequence 11, image 25. No suspicious hepatic lesions. Normal appearance of the gallbladder. No biliary dilatation. Pancreas: Unremarkable. No pancreatic ductal dilatation or surrounding inflammatory changes. Spleen: Normal in size without focal abnormality. Adrenals/Urinary Tract: Normal  appearance of both adrenal glands. Again noted are hyperdense and hypodense lesions in both kidneys. Largest hyperdense area in the right kidney lower pole measures up to 4.4 cm and Hounsfield units do not significant change between the arterial and venous phase. Largest hyperdense lesion on the left measures up to 2.6 cm and Hounsfield units do not significantly change between the arterial and venous phase images. No hydronephrosis. Normal appearance of the urinary bladder. Stomach/Bowel: Since 08/24/2020, there has been increased distension of the stomach. Increased colonic distension and increased small bowel distention particularly in the lower abdomen. Again noted is perirectal stranding and edema. Small amount of presacral edema. Moderate amount of stool like material in the rectum. Dilated loop of small bowel in the left lower quadrant measures up to 3.7 cm. No focal wall thickening. Evidence for a bowel anastomosis in the right anterior abdomen on sequence 5, image 90. Lymphatic: No significant lymph node enlargement in the abdomen or pelvis. Reproductive: Prostate is unremarkable. Other: Mild subcutaneous edema. No significant ascites. There may be trace edema in the anterior abdomen on sequence 5, image 78. Musculoskeletal: Stable  sclerosis in the superior left femoral head could represent a small focus of avascular necrosis. Bridging osteophytes along the right side of the thoracic spine. IMPRESSION: VASCULAR 1. 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 There appears to be distal reconstitution of the IMA distribution. Celiac artery and SMA are patent without significant stenosis. 2. Mild atherosclerotic disease in the abdominal aorta without aneurysm. 3. Severe stenosis involving the proximal internal iliac arteries bilaterally. Mild-to-moderate narrowing in the left external iliac artery. NON-VASCULAR 1. Increased distension of the stomach, small bowel and colon since 08/24/2020. Based on the history of diarrhea, findings are unlikely related to an obstructive process. Persistent perirectal stranding and edema that could represent inflammatory changes near the rectum. Overall, no significant bowel wall thickening. 2. Dilatation of the distal esophagus related to the gastric distension. 3. Again noted are numerous hyperdense and hypodense renal lesions. The hyperdense lesions are similar to the recent noncontrast examination and these hyperdense lesions do not significantly change in Hounsfield units between the arterial and venous phase. Technically, these are indeterminate lesions but suspect these represent hemorrhagic or proteinaceous cysts. 4. Stable nodularity in the left upper chest. Electronically Signed   By: Markus Daft M.D.   On: 08/30/2020 11:15        Scheduled Meds: . sodium chloride   Intravenous Once  . bisacodyl  5 mg Oral Once  . Chlorhexidine Gluconate Cloth  6 each Topical Q0600  . Chlorhexidine Gluconate Cloth  6 each Topical Q0600  . [START ON 09/01/2020] Chlorhexidine Gluconate Cloth  6 each Topical Q0600  . darbepoetin (ARANESP) injection - DIALYSIS  60 mcg Intravenous Q Sat-HD  . doxercalciferol  7 mcg Intravenous Q T,Th,Sa-HD  . metoCLOPramide (REGLAN) injection  5 mg Intravenous Once  .  niacin  50 mg Oral TID WC  . pantoprazole  40 mg Oral BID  . saccharomyces boulardii  250 mg Oral BID  . [START ON 09/01/2020] sodium phosphate  1 enema Rectal Once  . vancomycin  500 mg Oral QID   Continuous Infusions:    LOS: 7 days    Time spent: 30 minutes    Barb Merino, MD [facility] Pager [phone]

## 2020-08-31 NOTE — Progress Notes (Addendum)
Progress Note  Chief Complaint:    Hematochezia     ASSESSMENT / PLAN:    #Hematochezia, prolonged hospitalization related to symptoms. Source of bleeding still unclear and we are going to proceed with endoscopic workup tomorrow. CTA was negative (occluded IMA, but patent SMA and celiac), so we will proceed with endo. -EGD and colonoscopy scheduled for 09/01/20  -He will not be able to tolerate a bowel prep so we will give Dulcolax tablets and enemas intead.  #Recent bleeding duodenal ulcer s/p endoscopic treatment (08/15/20), H. Pylori negative -Continue PPI  #Acute Blood loss anemia r/t hematochezia -Received 3 U total this hospitalization, Hgb stable at 9.6 -Receiving Aranesp per nephrology  #C. Difficile Colitis with associated diarrhea and abdominal pain, improving -WBC's 27.3->18.0->11.8 -Continue Vancomycin (day 7) and Florastor  #Nausea and vomiting with CT scan suggesting a generalized ileus and gastric distension -08/29/20 CTA shows small bowel and colon distention along with gastric distention and dilatation of the distal esophagus. -NG tube to decompress -IV reglan x1, phenergan and zofran prn  #A fib -Supratherapeutic INR on admission, on coumadin, reversed with Vit K, now on hold  #ESRD, on HD -Went yesterday, continue T/TH/S      SUBJECTIVE:   Patient is feeling poorly with episodes of nausea and vomiting and hiccups, asking for Alka Seltzer.  The patient did have an episode of bloody diarrhea this am per wife.       OBJECTIVE:    Scheduled inpatient medications:  . sodium chloride   Intravenous Once  . Chlorhexidine Gluconate Cloth  6 each Topical Q0600  . Chlorhexidine Gluconate Cloth  6 each Topical Q0600  . darbepoetin (ARANESP) injection - DIALYSIS  60 mcg Intravenous Q Sat-HD  . doxercalciferol  7 mcg Intravenous Q T,Th,Sa-HD  . niacin  50 mg Oral TID WC  . pantoprazole  40 mg Oral BID  . saccharomyces boulardii  250 mg Oral BID  . vancomycin  500  mg Oral QID   Continuous inpatient infusions:  PRN inpatient medications: acetaminophen **OR** acetaminophen, HYDROmorphone (DILAUDID) injection, hyoscyamine, ondansetron (ZOFRAN) IV, promethazine  Vital signs in last 24 hours: Temp:  [97.8 F (36.6 C)-98.4 F (36.9 C)] 98 F (36.7 C) (02/25 0727) Pulse Rate:  [69-124] 98 (02/25 0727) Resp:  [15-18] 15 (02/25 0727) BP: (108-159)/(56-89) 159/78 (02/25 0727) SpO2:  [97 %-100 %] 98 % (02/25 0727) Weight:  [56.2 kg] 56.2 kg (02/24 1152) Last BM Date: 08/29/20  Intake/Output Summary (Last 24 hours) at 08/31/2020 1032 Last data filed at 08/31/2020 0900 Gross per 24 hour  Intake 120 ml  Output 343 ml  Net -223 ml     Physical Exam:  . General: Alert male in NAD. Marland Kitchen Heart:  Regular rate and rhythm. No lower extremity edema . Pulmonary: Normal respiratory effort . Abdomen: Distended, mild tenderness to palpation all quadrants, especially epigastrium.       Normal bowel sounds.  . Neurologic: Alert and oriented . Psych: Pleasant. Cooperative.   Filed Weights   08/28/20 1210 08/30/20 0844 08/30/20 1152  Weight: 58.8 kg 56.7 kg 56.2 kg    Intake/Output from previous day: 02/24 0701 - 02/25 0700 In: -  Out: 343  Intake/Output this shift: Total I/O In: 120 [P.O.:120] Out: -     Lab Results: Recent Labs    08/29/20 0258 08/30/20 0231 08/31/20 0203  WBC 27.3* 18.0* 11.8*  HGB 7.8* 7.0* 9.6*  HCT 23.1* 21.7* 30.3*  PLT 417* 414* 270   BMET Recent Labs  08/30/20 0927  NA 137  K 4.2  CL 94*  CO2 24  GLUCOSE 93  BUN 48*  CREATININE 7.40*  CALCIUM 8.7*   LFT Recent Labs    08/30/20 0927  ALBUMIN 2.2*   PT/INR No results for input(s): LABPROT, INR in the last 72 hours. Hepatitis Panel No results for input(s): HEPBSAG, HCVAB, HEPAIGM, HEPBIGM in the last 72 hours.  CT Angio Abd/Pel w/ and/or w/o  Result Date: 08/30/2020 CLINICAL DATA:  75 year old with intestinal malabsorption. Abdominal pain. Diarrhea.  Evaluate for mesenteric ischemia. History of rectal cancer. EXAM: CTA ABDOMEN AND PELVIS WITHOUT AND WITH CONTRAST TECHNIQUE: Multidetector CT imaging of the abdomen and pelvis was performed using the standard protocol during bolus administration of intravenous contrast. Multiplanar reconstructed images and MIPs were obtained and reviewed to evaluate the vascular anatomy. CONTRAST:  62mL OMNIPAQUE IOHEXOL 350 MG/ML SOLN COMPARISON:  CT abdomen and pelvis 08/24/2020 and chest CT 03/14/2020 FINDINGS: VASCULAR Aorta: Normal caliber of the visualized thoracic aorta. Abdominal aorta has mild atherosclerotic disease without aneurysm or dissection. Celiac: Atherosclerotic calcifications at the origin without significant stenosis. Main branch vessels are patent without evidence of aneurysm. SMA: SMA is widely patent. Variant anatomy with a replaced right hepatic artery. Main SMA branches are patent. Renals: Bilateral renal arteries are patent. At least mild stenosis involving the proximal right renal artery. No evidence for renal artery aneurysm. IMA: Origin of the IMA appears to be occluded. There is flow in the distal IMA branches through collateral flow from the SMA. Inflow: Atherosclerotic plaque in the iliac arteries. The common iliac arteries are patent without significant stenosis. Focal narrowing in the distal left external iliac artery causing mild-to-moderate narrowing. Right external iliac artery is patent without significant stenosis. Atherosclerotic plaque and high-grade stenosis involving the proximal internal iliac arteries bilaterally. Proximal Outflow: Proximal femoral arteries are patent bilaterally. Veins: Portal venous system is patent. SMV is patent. IVC is small but patent. Iliac veins are patent. No gross venous abnormality. Review of the MIP images confirms the above findings. NON-VASCULAR Lower chest: Visualized pulmonary arteries are patent. Heart size is normal without significant pericardial  effusion. Distal esophagus is distended with fluid. Patient has had previous left lung surgery. Chronic soft tissue thickening or nodularity in the left upper chest on sequence 1, image 12 is similar to the chest CT from 09/12/2019. Stable pleural-based thickening with calcifications in the anterior left chest. Peripheral densities in left lower lobe are most compatible with atelectasis. Stable pleural-based calcifications in the anterior right chest. Bandlike density in the right lower lobe is suggestive for atelectasis. No large pleural effusions. Hepatobiliary: Indeterminate 7 mm hypodensity in the right hepatic lobe on sequence 11, image 25. No suspicious hepatic lesions. Normal appearance of the gallbladder. No biliary dilatation. Pancreas: Unremarkable. No pancreatic ductal dilatation or surrounding inflammatory changes. Spleen: Normal in size without focal abnormality. Adrenals/Urinary Tract: Normal appearance of both adrenal glands. Again noted are hyperdense and hypodense lesions in both kidneys. Largest hyperdense area in the right kidney lower pole measures up to 4.4 cm and Hounsfield units do not significant change between the arterial and venous phase. Largest hyperdense lesion on the left measures up to 2.6 cm and Hounsfield units do not significantly change between the arterial and venous phase images. No hydronephrosis. Normal appearance of the urinary bladder. Stomach/Bowel: Since 08/24/2020, there has been increased distension of the stomach. Increased colonic distension and increased small bowel distention particularly in the lower abdomen. Again noted is perirectal stranding and  edema. Small amount of presacral edema. Moderate amount of stool like material in the rectum. Dilated loop of small bowel in the left lower quadrant measures up to 3.7 cm. No focal wall thickening. Evidence for a bowel anastomosis in the right anterior abdomen on sequence 5, image 90. Lymphatic: No significant lymph node  enlargement in the abdomen or pelvis. Reproductive: Prostate is unremarkable. Other: Mild subcutaneous edema. No significant ascites. There may be trace edema in the anterior abdomen on sequence 5, image 78. Musculoskeletal: Stable sclerosis in the superior left femoral head could represent a small focus of avascular necrosis. Bridging osteophytes along the right side of the thoracic spine. IMPRESSION: VASCULAR 1. 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 There appears to be distal reconstitution of the IMA distribution. Celiac artery and SMA are patent without significant stenosis. 2. Mild atherosclerotic disease in the abdominal aorta without aneurysm. 3. Severe stenosis involving the proximal internal iliac arteries bilaterally. Mild-to-moderate narrowing in the left external iliac artery. NON-VASCULAR 1. Increased distension of the stomach, small bowel and colon since 08/24/2020. Based on the history of diarrhea, findings are unlikely related to an obstructive process. Persistent perirectal stranding and edema that could represent inflammatory changes near the rectum. Overall, no significant bowel wall thickening. 2. Dilatation of the distal esophagus related to the gastric distension. 3. Again noted are numerous hyperdense and hypodense renal lesions. The hyperdense lesions are similar to the recent noncontrast examination and these hyperdense lesions do not significantly change in Hounsfield units between the arterial and venous phase. Technically, these are indeterminate lesions but suspect these represent hemorrhagic or proteinaceous cysts. 4. Stable nodularity in the left upper chest. Electronically Signed   By: Markus Daft M.D.   On: 08/30/2020 11:15      Active Problems:   Hematochezia   GIB (gastrointestinal bleeding)   C. difficile colitis   Generalized abdominal pain   Non-intractable vomiting     LOS: 7 days   Luetta Nutting, RN, NP-student 08/31/2020, 10:32 AM

## 2020-08-31 NOTE — Progress Notes (Signed)
OT Cancellation Note  Patient Details Name: Jason DELAGE Sr. MRN: [id number] DOB: 1946-03-30   Cancelled Treatment:    Reason Eval/Treat Not Completed: Medical issues which prohibited therapy;Fatigue/lethargy limiting ability to participate. Per RN pt not ding well today, continues to have n/v and abdominal pain and bloody diarrhea. OT will follow up next available time  Britt Bottom 08/31/2020, 1:03 PM

## 2020-08-31 NOTE — Progress Notes (Addendum)
[facility] Progress Note   Subjective:   Patient seen and examined in room.  Wife at bedside.  Continues to have n/v and abdominal, still has not been able to keep anything down.  Admits to bloody diarrhea overnight.  Per nursing unable to tolerated insertion of rectal tube.     Objective Vitals:   08/30/20 1152 08/30/20 1254 08/30/20 1659 08/31/20 0727  BP: (!) 114/56 137/89 129/85 (!) 159/78  Pulse: 69 (!) 106 (!) 102 98  Resp: 18 18 18 15   Temp: 97.8 F (36.6 C) 98.4 F (36.9 C) 98.2 F (36.8 C) 98 F (36.7 C)  TempSrc: Oral Oral Oral Oral  SpO2: 97% 100% 97% 98%  Weight: 56.2 kg     Height:       Physical Exam General: elderly, ill appearing male in NAD but wincing in pain Heart:+tachycardia, RR, no mrg Lungs:mostly CTAB, breath sounds decreased bilaterally Abdomen:soft, +distended, +tenderness throughout Extremities:no LE edema Dialysis Access: LU AVF cannulated   Filed Weights   08/28/20 1210 08/30/20 0844 08/30/20 1152  Weight: 58.8 kg 56.7 kg 56.2 kg    Intake/Output Summary (Last 24 hours) at 08/31/2020 1110 Last data filed at 08/31/2020 0900 Gross per 24 hour  Intake 120 ml  Output 343 ml  Net -223 ml    Additional Objective Labs: Basic Metabolic Panel: Recent Labs  Lab 08/25/20 1413 08/28/20 0707 08/30/20 0927  NA 133* 133* 137  K 5.3* 4.6 4.2  CL 96* 96* 94*  CO2 19* 23 24  GLUCOSE 74 91 93  BUN 55* 49* 48*  CREATININE 9.76* 7.81* 7.40*  CALCIUM 7.5* 8.0* 8.7*  PHOS 5.9* 5.1* 6.3*   Liver Function Tests: Recent Labs  Lab 08/24/20 1511 08/25/20 1006 08/25/20 1413 08/28/20 0707 08/30/20 0927  AST 15 12*  --   --   --   ALT 9 9  --   --   --   ALKPHOS 99 82  --   --   --   BILITOT 0.7 0.9  --   --   --   PROT 7.1 5.2*  --   --   --   ALBUMIN 3.3* 2.3* 2.3* 2.4* 2.2*   CBC: Recent Labs  Lab 08/27/20 0545 08/28/20 0348 08/29/20 0258 08/30/20 0231 08/31/20 0203  WBC 27.3* 29.0* 27.3* 18.0* 11.8*  NEUTROABS 24.2*  25.3* 24.9* 15.4* 9.6*  HGB 7.8* 8.7* 7.8* 7.0* 9.6*  HCT 23.4* 25.3* 23.1* 21.7* 30.3*  MCV 88.6 89.1 89.5 91.2 85.8  PLT 361 431* 417* 414* 270   Blood Culture    Component Value Date/Time   SDES IN/OUT CATH URINE 08/17/2020 1234   SPECREQUEST  08/17/2020 1234    NONE Performed at [facility], [place] [address], [place], [place] [address]    CULT 4,000 COLONIES/mL ENTEROCOCCUS GALLINARUM (A) 08/17/2020 1234   REPTSTATUS 08/21/2020 FINAL 08/17/2020 1234   CBG: Recent Labs  Lab 08/28/20 1629 08/29/20 0655  GLUCAP 80 92   Medications:  . sodium chloride   Intravenous Once  . Chlorhexidine Gluconate Cloth  6 each Topical Q0600  . Chlorhexidine Gluconate Cloth  6 each Topical Q0600  . darbepoetin (ARANESP) injection - DIALYSIS  60 mcg Intravenous Q Sat-HD  . doxercalciferol  7 mcg Intravenous Q T,Th,Sa-HD  . niacin  50 mg Oral TID WC  . pantoprazole  40 mg Oral BID  . saccharomyces boulardii  250 mg Oral BID  . vancomycin  500 mg Oral QID  Dialysis Orders: [facility] 4h 450/500 58kg 3K/2Ca UFP2 L AVF No heparin  Mircera 50 (to start 2/22) Venofer 50  Hectorol 7   Assessment/Plan: 1. C. Diff colitis/ diarrhea/ Nausea/vomiting/Abdominal pain . On PO Vanc/Florastor.CTA abd/pelvis competed 2/24 with findings suspicious for ileus, no obstruction.  Colonoscopy/EGD cancelled due to clinical improvement yesterday, symptoms appear to have worsened again today. Per primary/GI following.  2. BRBPR/ABLA/ Hx GI bleed/rectal Ca: w/ supra therapeutic INR on admit (during previous admissionEGD2/9with duodenal ulcer, treated w/ clip+ injection). On Protonix. S/p 2u prbcs 2/19. Hgb improved to 9.6 today s/p 1unit pRBC 2/24. . 3. ESRD - HD TTS.HD tomorrow per regular schedule.lastK 4.2. 4. AFib- on coumadin. Supratherapeutic INR on admission.Reversed with Vit K. Coumadinheld. 5. HTN/Volume - BPvariable, high this AM, possibly due to pain.Not on  antihypertensives.Does not appear volume overloaded, minimal UF with HD. 6. Anemia CKD --see #1. Hgb9.6 s/p 1 unit prbc yesterday, continue aranesp 60qwk. 7. MBD CKD --Corr Caok.  Phos elevated. Continue home binders when able to tolerate PO. Hold Sensipar for now.  8. COPD  9. H/o prior rectal cancer - in 2014 treated w/ chemoRx,radiation, laparoscopic low anterior resection with diverting loop ileostomy and subsequent ileostomy takedown.   Jen Mow, PA-C [facility] 08/31/2020,11:10 AM  LOS: 7 days   Pt seen, examined and agree w A/P as above.  Kelly Splinter  MD 08/31/2020, 2:10 PM

## 2020-08-31 NOTE — H&P (View-Only) (Signed)
Attending physician's note   I have taken an interval history, reviewed the chart and examined the patient. I agree with the Advanced Practitioner's note, impression, and recommendations as outlined.   Despite feeling better yesterday, he again had nausea/vomiting overnight and today.  Abdominal pain has returned.  Still with bloody stool this morning.  Staff attempted rectal tube for patient comfort and skin breakdown, but patient did not tolerate.  WBC improved to 11.8 today (18 yesterday).  Transfused 1 unit PRBCs yesterday for hemoglobin 7, with repeat 9.6 this morning.  -NGT today -Change to n.p.o. -EGD/colonoscopy tomorrow for diagnostic and potentially therapeutic intent -Do not think he be able to tolerate bowel prep, along with concerns for aspiration of prep given CT findings.  Therefore only Dulcolax and enema in preparation for colonoscopy -Continue vancomycin and [place], DO, FACG [phone] office            Progress Note  Chief Complaint:   Ongoing blood in stool, nausea and vomiting today.      ASSESSMENT / PLAN:    # C.diff colitis, improving. Frequency of diarrhea improving and no bowel wall thickening on on CTA two days ago, --WBC down to 11.8 --Continue Vancomycin (on day # 7). Continue Florastor  # Hematochezia. Extended hospitalization for ongoing hematochezia. CTA abd/pelvis yesterday to evaluate for mesenteric ischemia, IMA occluded but celiac and SMA are patent. He had blood in stool last night and again this am. Etiology still unclear.  --EGD / colonoscopy were cancelled due to clinical improvement. With ongoing bleeding will plan for procedures tomorrow. EGD and colonoscopy tomorrow. The risks and benefits of EGD and colonoscopy with possible polypectomy / biopsies were discussed and the patient agrees to proceed.  --He is unable to complete a bowel prep due to nausea, vomiting, what appears to be generalized ileus and gastric  distention on CT scan. Will give PO dulcolax and fleets enemas instead.    # Acute blood loss anemia secondary to ongoing hematochezi . Received another unit of blood yesterday for hgb of 7 ( 3rd unit this admission) . Hgb 9.6 today. Getting Aranesp.    # Gastric distention, small bowel and colonic distention on CTA 08/29/20. Also dilation of distal esophagus.   --Patient belching, having nausea, vomiting this am.  --Place NGT to LIS --IV reglan now.    # ESRD on HD      SUBJECTIVE:   Belching, vomiting this am.     OBJECTIVE:    Scheduled inpatient medications:  . sodium chloride   Intravenous Once  . Chlorhexidine Gluconate Cloth  6 each Topical Q0600  . Chlorhexidine Gluconate Cloth  6 each Topical Q0600  . darbepoetin (ARANESP) injection - DIALYSIS  60 mcg Intravenous Q Sat-HD  . doxercalciferol  7 mcg Intravenous Q T,Th,Sa-HD  . niacin  50 mg Oral TID WC  . pantoprazole  40 mg Oral BID  . saccharomyces boulardii  250 mg Oral BID  . vancomycin  500 mg Oral QID   Continuous inpatient infusions:  PRN inpatient medications: acetaminophen **OR** acetaminophen, HYDROmorphone (DILAUDID) injection, hyoscyamine, ondansetron (ZOFRAN) IV, promethazine  Vital signs in last 24 hours: Temp:  [97.8 F (36.6 C)-98.4 F (36.9 C)] 98 F (36.7 C) (02/25 0727) Pulse Rate:  [69-124] 98 (02/25 0727) Resp:  [15-18] 15 (02/25 0727) BP: (108-159)/(56-89) 159/78 (02/25 0727) SpO2:  [97 %-100 %] 98 % (02/25 0727) Weight:  [56.2 kg] 56.2 kg (02/24 1152) Last BM Date: 08/29/20  Intake/Output Summary (  Last 24 hours) at 08/31/2020 1047 Last data filed at 08/31/2020 0900 Gross per 24 hour  Intake 120 ml  Output 343 ml  Net -223 ml     Physical Exam:  . General: Alert thin male in NAD. Wife at bedside . Heart:  Regular rate and rhythm. No lower extremity edema . Pulmonary: Normal respiratory effort . Abdomen: Soft, mildly distended with active bowel sounds. He has diffuse abdominal  tenderness.   . Neurologic: Alert and oriented . Psych: Pleasant. Cooperative.   Filed Weights   08/28/20 1210 08/30/20 0844 08/30/20 1152  Weight: 58.8 kg 56.7 kg 56.2 kg    Intake/Output from previous day: 02/24 0701 - 02/25 0700 In: -  Out: 343  Intake/Output this shift: Total I/O In: 120 [P.O.:120] Out: -     Lab Results: Recent Labs    08/29/20 0258 08/30/20 0231 08/31/20 0203  WBC 27.3* 18.0* 11.8*  HGB 7.8* 7.0* 9.6*  HCT 23.1* 21.7* 30.3*  PLT 417* 414* 270   BMET Recent Labs    08/30/20 0927  NA 137  K 4.2  CL 94*  CO2 24  GLUCOSE 93  BUN 48*  CREATININE 7.40*  CALCIUM 8.7*   LFT Recent Labs    08/30/20 0927  ALBUMIN 2.2*   PT/INR No results for input(s): LABPROT, INR in the last 72 hours. Hepatitis Panel No results for input(s): HEPBSAG, HCVAB, HEPAIGM, HEPBIGM in the last 72 hours.  CT Angio Abd/Pel w/ and/or w/o  Result Date: 08/30/2020 CLINICAL DATA:  75 year old with intestinal malabsorption. Abdominal pain. Diarrhea. Evaluate for mesenteric ischemia. History of rectal cancer. EXAM: CTA ABDOMEN AND PELVIS WITHOUT AND WITH CONTRAST TECHNIQUE: Multidetector CT imaging of the abdomen and pelvis was performed using the standard protocol during bolus administration of intravenous contrast. Multiplanar reconstructed images and MIPs were obtained and reviewed to evaluate the vascular anatomy. CONTRAST:  38mL OMNIPAQUE IOHEXOL 350 MG/ML SOLN COMPARISON:  CT abdomen and pelvis 08/24/2020 and chest CT 03/14/2020 FINDINGS: VASCULAR Aorta: Normal caliber of the visualized thoracic aorta. Abdominal aorta has mild atherosclerotic disease without aneurysm or dissection. Celiac: Atherosclerotic calcifications at the origin without significant stenosis. Main branch vessels are patent without evidence of aneurysm. SMA: SMA is widely patent. Variant anatomy with a replaced right hepatic artery. Main SMA branches are patent. Renals: Bilateral renal arteries are  patent. At least mild stenosis involving the proximal right renal artery. No evidence for renal artery aneurysm. IMA: Origin of the IMA appears to be occluded. There is flow in the distal IMA branches through collateral flow from the SMA. Inflow: Atherosclerotic plaque in the iliac arteries. The common iliac arteries are patent without significant stenosis. Focal narrowing in the distal left external iliac artery causing mild-to-moderate narrowing. Right external iliac artery is patent without significant stenosis. Atherosclerotic plaque and high-grade stenosis involving the proximal internal iliac arteries bilaterally. Proximal Outflow: Proximal femoral arteries are patent bilaterally. Veins: Portal venous system is patent. SMV is patent. IVC is small but patent. Iliac veins are patent. No gross venous abnormality. Review of the MIP images confirms the above findings. NON-VASCULAR Lower chest: Visualized pulmonary arteries are patent. Heart size is normal without significant pericardial effusion. Distal esophagus is distended with fluid. Patient has had previous left lung surgery. Chronic soft tissue thickening or nodularity in the left upper chest on sequence 1, image 12 is similar to the chest CT from 09/12/2019. Stable pleural-based thickening with calcifications in the anterior left chest. Peripheral densities in left lower lobe are  most compatible with atelectasis. Stable pleural-based calcifications in the anterior right chest. Bandlike density in the right lower lobe is suggestive for atelectasis. No large pleural effusions. Hepatobiliary: Indeterminate 7 mm hypodensity in the right hepatic lobe on sequence 11, image 25. No suspicious hepatic lesions. Normal appearance of the gallbladder. No biliary dilatation. Pancreas: Unremarkable. No pancreatic ductal dilatation or surrounding inflammatory changes. Spleen: Normal in size without focal abnormality. Adrenals/Urinary Tract: Normal appearance of both adrenal  glands. Again noted are hyperdense and hypodense lesions in both kidneys. Largest hyperdense area in the right kidney lower pole measures up to 4.4 cm and Hounsfield units do not significant change between the arterial and venous phase. Largest hyperdense lesion on the left measures up to 2.6 cm and Hounsfield units do not significantly change between the arterial and venous phase images. No hydronephrosis. Normal appearance of the urinary bladder. Stomach/Bowel: Since 08/24/2020, there has been increased distension of the stomach. Increased colonic distension and increased small bowel distention particularly in the lower abdomen. Again noted is perirectal stranding and edema. Small amount of presacral edema. Moderate amount of stool like material in the rectum. Dilated loop of small bowel in the left lower quadrant measures up to 3.7 cm. No focal wall thickening. Evidence for a bowel anastomosis in the right anterior abdomen on sequence 5, image 90. Lymphatic: No significant lymph node enlargement in the abdomen or pelvis. Reproductive: Prostate is unremarkable. Other: Mild subcutaneous edema. No significant ascites. There may be trace edema in the anterior abdomen on sequence 5, image 78. Musculoskeletal: Stable sclerosis in the superior left femoral head could represent a small focus of avascular necrosis. Bridging osteophytes along the right side of the thoracic spine. IMPRESSION: VASCULAR 1. 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 There appears to be distal reconstitution of the IMA distribution. Celiac artery and SMA are patent without significant stenosis. 2. Mild atherosclerotic disease in the abdominal aorta without aneurysm. 3. Severe stenosis involving the proximal internal iliac arteries bilaterally. Mild-to-moderate narrowing in the left external iliac artery. NON-VASCULAR 1. Increased distension of the stomach, small bowel and colon since 08/24/2020. Based on the history of diarrhea,  findings are unlikely related to an obstructive process. Persistent perirectal stranding and edema that could represent inflammatory changes near the rectum. Overall, no significant bowel wall thickening. 2. Dilatation of the distal esophagus related to the gastric distension. 3. Again noted are numerous hyperdense and hypodense renal lesions. The hyperdense lesions are similar to the recent noncontrast examination and these hyperdense lesions do not significantly change in Hounsfield units between the arterial and venous phase. Technically, these are indeterminate lesions but suspect these represent hemorrhagic or proteinaceous cysts. 4. Stable nodularity in the left upper chest. Electronically Signed   By: Markus Daft M.D.   On: 08/30/2020 11:15    PREVIOUS ENDOSCOPIES:    Active Problems:   Hematochezia   GIB (gastrointestinal bleeding)   C. difficile colitis   Generalized abdominal pain   Non-intractable vomiting     LOS: 7 days   Tye Savoy ,NP 08/31/2020, 10:47 AM

## 2020-08-31 NOTE — Progress Notes (Incomplete)
Patients HR 140-1

## 2020-08-31 NOTE — Progress Notes (Signed)
   08/31/20 1738  PT Visit Information  Last PT Received On 08/31/20  Assistance Needed +1  Reason Eval/Treat Not Completed Medical issues which prohibited therapy (Pt with nausea and vomitting and not feeling well.  Will defer therapy at this time.)

## 2020-08-31 NOTE — Significant Event (Addendum)
Rapid Response Event Note   Reason for Call :  Place NG tube O2 sat 69% on RA  Initial Focused Assessment:  Patient is alert and orient, complaining of abdominal pain, he is distended.  Lung sounds clear, decreased bases. Patient with large maroon stool  BP 92/54  ST 161 BP 79/51   Difficult to obtain O2 sats, patient on NRB 86-100% RR 30 Temp 97.9 CBG "LOW"    Interventions:  Attempted to placed NG tube unsuccessful Weaned O2 to 6L [place] O2 sats 98%   1 amp D50 given,  CBG 62, 122, 92 500 CC LR bolus given BP improved to 108/60  HR 155 ABG done 7.11/01/53/21 Labs ordered  Plan of Care:  Transfer to Progressive care   Event Summary:   MD Notified: Dr Louanne Belton came to bedsidee Call Time: 1707 Arrival Time: 3888 End Time: 1826  Raliegh Ip, RN

## 2020-08-31 NOTE — Progress Notes (Signed)
   08/31/20 1700  Vitals  Temp 98.5 F (36.9 C)  Temp Source Oral  BP (!) 150/83  MAP (mmHg) 97  BP Location Right Wrist  BP Method Automatic  Patient Position (if appropriate) Lying  Pulse Rate (!) 149  Pulse Rate Source Dinamap  Resp (!) 25  Level of Consciousness  Level of Consciousness Alert  MEWS COLOR  MEWS Score Color Red  Oxygen Therapy  SpO2 (!) 68 %  O2 Device Room Air  Pain Assessment  Pain Scale 0-10  Pain Score 0  PCA/Epidural/Spinal Assessment  Respiratory Pattern Labored  MEWS Score  MEWS Temp 0  MEWS Systolic 0  MEWS Pulse 3  MEWS RR 1  MEWS LOC 0  MEWS Score 4  Provider Notification  Provider Name/Title Dr. Barb Merino  Date Provider Notified 08/31/20  Time Provider Notified 1657  Notification Type  (secure msg)  Notification Reason Other (Comment) (AFiB 140')  Provider response No new orders  Date of Provider Response 08/31/20  Time of Provider Response 1658  Rapid Response Notification  Name of Rapid Response RN Notified Raliegh Ip  Date Rapid Response Notified 08/31/20  Time Rapid Response Notified 7782  Note  Observations  (Rapid response RN notified)    Pt's O2 sat dropped to 60s on room air, Pt was weak but alert. RN notified MD, charge RN and rapid response RN called. Pt placed on a non-rebreather mask, O2 sat came up to 95%. Rapid RN and charge RN and Dr. Louanne Belton came to room. CBG low upon checking, d50 given, CBG result upon rechecking 62(after 2mins) 122 (after 35mins), 95 after 62mins. New orders received and carried out, transferred to 4E for further care.

## 2020-09-01 ENCOUNTER — Inpatient Hospital Stay (HOSPITAL_COMMUNITY): Payer: Medicare Other | Admitting: Certified Registered Nurse Anesthetist

## 2020-09-01 ENCOUNTER — Encounter (HOSPITAL_COMMUNITY)
Admission: EM | Disposition: A | Discharge status: Hospice | Payer: Self-pay | Source: Home / Self Care | Attending: Internal Medicine

## 2020-09-01 ENCOUNTER — Encounter (HOSPITAL_COMMUNITY): Payer: Self-pay | Admitting: Internal Medicine

## 2020-09-01 DIAGNOSIS — Z7189 Other specified counseling: Secondary | ICD-10-CM | POA: Diagnosis not present

## 2020-09-01 DIAGNOSIS — K209 Esophagitis, unspecified without bleeding: Secondary | ICD-10-CM

## 2020-09-01 DIAGNOSIS — K921 Melena: Secondary | ICD-10-CM | POA: Diagnosis not present

## 2020-09-01 DIAGNOSIS — K2970 Gastritis, unspecified, without bleeding: Secondary | ICD-10-CM | POA: Diagnosis not present

## 2020-09-01 DIAGNOSIS — Z515 Encounter for palliative care: Secondary | ICD-10-CM | POA: Diagnosis not present

## 2020-09-01 DIAGNOSIS — K633 Ulcer of intestine: Secondary | ICD-10-CM | POA: Diagnosis not present

## 2020-09-01 DIAGNOSIS — K624 Stenosis of anus and rectum: Secondary | ICD-10-CM

## 2020-09-01 DIAGNOSIS — R1084 Generalized abdominal pain: Secondary | ICD-10-CM | POA: Diagnosis not present

## 2020-09-01 DIAGNOSIS — R112 Nausea with vomiting, unspecified: Secondary | ICD-10-CM | POA: Diagnosis not present

## 2020-09-01 DIAGNOSIS — K529 Noninfective gastroenteritis and colitis, unspecified: Secondary | ICD-10-CM | POA: Diagnosis present

## 2020-09-01 DIAGNOSIS — A0472 Enterocolitis due to Clostridium difficile, not specified as recurrent: Secondary | ICD-10-CM | POA: Diagnosis not present

## 2020-09-01 DIAGNOSIS — R140 Abdominal distension (gaseous): Secondary | ICD-10-CM | POA: Diagnosis present

## 2020-09-01 HISTORY — PX: FLEXIBLE SIGMOIDOSCOPY: SHX5431

## 2020-09-01 HISTORY — PX: ESOPHAGOGASTRODUODENOSCOPY (EGD) WITH PROPOFOL: SHX5813

## 2020-09-01 HISTORY — PX: BIOPSY: SHX5522

## 2020-09-01 LAB — CBC WITH DIFFERENTIAL/PLATELET
Abs Immature Granulocytes: 0.25 10*3/uL — ABNORMAL HIGH (ref 0.00–0.07)
Basophils Absolute: 0.1 10*3/uL (ref 0.0–0.1)
Basophils Relative: 0 %
Eosinophils Absolute: 0 10*3/uL (ref 0.0–0.5)
Eosinophils Relative: 0 %
HCT: 28.4 % — ABNORMAL LOW (ref 39.0–52.0)
Hemoglobin: 8.9 g/dL — ABNORMAL LOW (ref 13.0–17.0)
Immature Granulocytes: 1 %
Lymphocytes Relative: 2 %
Lymphs Abs: 0.5 10*3/uL — ABNORMAL LOW (ref 0.7–4.0)
MCH: 27.2 pg (ref 26.0–34.0)
MCHC: 31.3 g/dL (ref 30.0–36.0)
MCV: 86.9 fL (ref 80.0–100.0)
Monocytes Absolute: 1.3 10*3/uL — ABNORMAL HIGH (ref 0.1–1.0)
Monocytes Relative: 5 %
Neutro Abs: 22.2 10*3/uL — ABNORMAL HIGH (ref 1.7–7.7)
Neutrophils Relative %: 92 %
Platelets: 353 10*3/uL (ref 150–400)
RBC: 3.27 MIL/uL — ABNORMAL LOW (ref 4.22–5.81)
RDW: 22.5 % — ABNORMAL HIGH (ref 11.5–15.5)
WBC: 24.3 10*3/uL — ABNORMAL HIGH (ref 4.0–10.5)
nRBC: 0.1 % (ref 0.0–0.2)

## 2020-09-01 LAB — RENAL FUNCTION PANEL
Albumin: 2.2 g/dL — ABNORMAL LOW (ref 3.5–5.0)
Anion gap: 20 — ABNORMAL HIGH (ref 5–15)
BUN: 44 mg/dL — ABNORMAL HIGH (ref 8–23)
CO2: 18 mmol/L — ABNORMAL LOW (ref 22–32)
Calcium: 8.7 mg/dL — ABNORMAL LOW (ref 8.9–10.3)
Chloride: 94 mmol/L — ABNORMAL LOW (ref 98–111)
Creatinine, Ser: 6.82 mg/dL — ABNORMAL HIGH (ref 0.61–1.24)
GFR, Estimated: 8 mL/min — ABNORMAL LOW (ref >60)
Glucose, Bld: 108 mg/dL — ABNORMAL HIGH (ref 70–99)
Phosphorus: 5.1 mg/dL — ABNORMAL HIGH (ref 2.5–4.6)
Potassium: 4.7 mmol/L (ref 3.5–5.1)
Sodium: 132 mmol/L — ABNORMAL LOW (ref 135–145)

## 2020-09-01 LAB — CBC
HCT: 30 % — ABNORMAL LOW (ref 39.0–52.0)
Hemoglobin: 9.7 g/dL — ABNORMAL LOW (ref 13.0–17.0)
MCH: 28 pg (ref 26.0–34.0)
MCHC: 32.3 g/dL (ref 30.0–36.0)
MCV: 86.5 fL (ref 80.0–100.0)
Platelets: 330 10*3/uL (ref 150–400)
RBC: 3.47 MIL/uL — ABNORMAL LOW (ref 4.22–5.81)
RDW: 22.5 % — ABNORMAL HIGH (ref 11.5–15.5)
WBC: 23.3 10*3/uL — ABNORMAL HIGH (ref 4.0–10.5)
nRBC: 0.1 % (ref 0.0–0.2)

## 2020-09-01 LAB — GLUCOSE, CAPILLARY
Glucose-Capillary: 37 mg/dL — CL (ref 70–99)
Glucose-Capillary: 42 mg/dL — CL (ref 70–99)
Glucose-Capillary: 53 mg/dL — ABNORMAL LOW (ref 70–99)
Glucose-Capillary: 67 mg/dL — ABNORMAL LOW (ref 70–99)
Glucose-Capillary: 71 mg/dL (ref 70–99)
Glucose-Capillary: 72 mg/dL (ref 70–99)
Glucose-Capillary: 80 mg/dL (ref 70–99)
Glucose-Capillary: 89 mg/dL (ref 70–99)

## 2020-09-01 LAB — BASIC METABOLIC PANEL
Anion gap: 17 — ABNORMAL HIGH (ref 5–15)
BUN: 40 mg/dL — ABNORMAL HIGH (ref 8–23)
CO2: 24 mmol/L (ref 22–32)
Calcium: 8.7 mg/dL — ABNORMAL LOW (ref 8.9–10.3)
Chloride: 93 mmol/L — ABNORMAL LOW (ref 98–111)
Creatinine, Ser: 6.64 mg/dL — ABNORMAL HIGH (ref 0.61–1.24)
GFR, Estimated: 8 mL/min — ABNORMAL LOW (ref >60)
Glucose, Bld: 136 mg/dL — ABNORMAL HIGH (ref 70–99)
Potassium: 3.6 mmol/L (ref 3.5–5.1)
Sodium: 134 mmol/L — ABNORMAL LOW (ref 135–145)

## 2020-09-01 LAB — PHOSPHORUS: Phosphorus: 4.7 mg/dL — ABNORMAL HIGH (ref 2.5–4.6)

## 2020-09-01 LAB — MAGNESIUM: Magnesium: 2.1 mg/dL (ref 1.7–2.4)

## 2020-09-01 LAB — GLUCOSE, RANDOM: Glucose, Bld: 90 mg/dL (ref 70–99)

## 2020-09-01 SURGERY — ESOPHAGOGASTRODUODENOSCOPY (EGD) WITH PROPOFOL
Anesthesia: Monitor Anesthesia Care

## 2020-09-01 MED ORDER — ALTEPLASE 2 MG IJ SOLR: 2.0000 mg | Freq: Once | INTRAMUSCULAR | Status: DC | PRN

## 2020-09-01 MED ORDER — DEXTROSE 50 % IV SOLN
25.0000 mL | Freq: Once | INTRAVENOUS | Status: AC
  Administered 2020-09-01: 25 mL via INTRAVENOUS

## 2020-09-01 MED ORDER — DEXTROSE 10 % IV SOLN
INTRAVENOUS | Status: DC
  Filled 2020-09-01: qty 1000

## 2020-09-01 MED ORDER — SODIUM CHLORIDE 0.9 % IV SOLN: 100.0000 mL | INTRAVENOUS | Status: DC | PRN

## 2020-09-01 MED ORDER — DEXTROSE 50 % IV SOLN
INTRAVENOUS | Status: AC
  Filled 2020-09-01: qty 50

## 2020-09-01 MED ORDER — PENTAFLUOROPROP-TETRAFLUOROETH EX AERO: 1.0000 "application " | INHALATION_SPRAY | CUTANEOUS | Status: DC | PRN

## 2020-09-01 MED ORDER — METOPROLOL TARTRATE 5 MG/5ML IV SOLN
2.5000 mg | Freq: Once | INTRAVENOUS | Status: AC
  Administered 2020-09-01: 2.5 mg via INTRAVENOUS

## 2020-09-01 MED ORDER — DEXTROSE 50 % IV SOLN
INTRAVENOUS | Status: AC
  Administered 2020-09-01: 50 mL
  Filled 2020-09-01: qty 50

## 2020-09-01 MED ORDER — GLUCAGON HCL RDNA (DIAGNOSTIC) 1 MG IJ SOLR
INTRAMUSCULAR | Status: AC
  Administered 2020-09-01: 1 mg via INTRAMUSCULAR
  Filled 2020-09-01: qty 1

## 2020-09-01 MED ORDER — SUCCINYLCHOLINE CHLORIDE 20 MG/ML IJ SOLN
INTRAMUSCULAR | Status: DC | PRN
Start: 2020-09-01 — End: 2020-09-01
  Administered 2020-09-01: 50 mg via INTRAVENOUS

## 2020-09-01 MED ORDER — METRONIDAZOLE IN NACL 5-0.79 MG/ML-% IV SOLN
500.0000 mg | Freq: Two times a day (BID) | INTRAVENOUS | Status: DC
  Administered 2020-09-01 – 2020-09-05 (×8): 500 mg via INTRAVENOUS
  Filled 2020-09-01 (×8): qty 100

## 2020-09-01 MED ORDER — METOPROLOL TARTRATE 5 MG/5ML IV SOLN
INTRAVENOUS | Status: AC
  Filled 2020-09-01: qty 5

## 2020-09-01 MED ORDER — LIDOCAINE HCL (PF) 1 % IJ SOLN: 5.0000 mL | INTRAMUSCULAR | Status: DC | PRN

## 2020-09-01 MED ORDER — ESMOLOL HCL 100 MG/10ML IV SOLN
INTRAVENOUS | Status: DC | PRN
  Administered 2020-09-01: 20 mg via INTRAVENOUS
  Administered 2020-09-01: 30 mg via INTRAVENOUS
  Administered 2020-09-01: 20 mg via INTRAVENOUS
  Administered 2020-09-01: 30 mg via INTRAVENOUS

## 2020-09-01 MED ORDER — PHENYLEPHRINE HCL-NACL 10-0.9 MG/250ML-% IV SOLN
INTRAVENOUS | Status: DC | PRN
  Administered 2020-09-01: 50 ug/min via INTRAVENOUS

## 2020-09-01 MED ORDER — PROPOFOL 500 MG/50ML IV EMUL
INTRAVENOUS | Status: DC | PRN
  Administered 2020-09-01: 50 ug/kg/min via INTRAVENOUS

## 2020-09-01 MED ORDER — ROCURONIUM BROMIDE 10 MG/ML (PF) SYRINGE
PREFILLED_SYRINGE | INTRAVENOUS | Status: DC | PRN
  Administered 2020-09-01: 20 mg via INTRAVENOUS

## 2020-09-01 MED ORDER — PHENYLEPHRINE 40 MCG/ML (10ML) SYRINGE FOR IV PUSH (FOR BLOOD PRESSURE SUPPORT)
PREFILLED_SYRINGE | INTRAVENOUS | Status: DC | PRN
  Administered 2020-09-01: 80 ug via INTRAVENOUS
  Administered 2020-09-01 (×2): 120 ug via INTRAVENOUS
  Administered 2020-09-01: 80 ug via INTRAVENOUS
  Administered 2020-09-01: 120 ug via INTRAVENOUS
  Administered 2020-09-01 (×2): 80 ug via INTRAVENOUS

## 2020-09-01 MED ORDER — ALBUMIN HUMAN 5 % IV SOLN: INTRAVENOUS | Status: DC | PRN

## 2020-09-01 MED ORDER — SUGAMMADEX SODIUM 200 MG/2ML IV SOLN
INTRAVENOUS | Status: DC | PRN
  Administered 2020-09-01: 200 mg via INTRAVENOUS

## 2020-09-01 MED ORDER — ONDANSETRON HCL 4 MG/2ML IJ SOLN
INTRAMUSCULAR | Status: DC | PRN
  Administered 2020-09-01: 4 mg via INTRAVENOUS

## 2020-09-01 MED ORDER — LIDOCAINE-PRILOCAINE 2.5-2.5 % EX CREA
1.0000 "application " | TOPICAL_CREAM | CUTANEOUS | Status: DC | PRN
  Filled 2020-09-01: qty 5

## 2020-09-01 MED ORDER — METOPROLOL TARTRATE 5 MG/5ML IV SOLN
INTRAVENOUS | Status: DC | PRN
  Administered 2020-09-01: 2.5 mg via INTRAVENOUS

## 2020-09-01 MED ORDER — PROPOFOL 10 MG/ML IV BOLUS
INTRAVENOUS | Status: DC | PRN
  Administered 2020-09-01: 100 mg via INTRAVENOUS

## 2020-09-01 MED ORDER — HEPARIN SODIUM (PORCINE) 1000 UNIT/ML DIALYSIS
1000.0000 [IU] | INTRAMUSCULAR | Status: DC | PRN
  Filled 2020-09-01: qty 1

## 2020-09-01 MED ORDER — SODIUM CHLORIDE 0.9 % IV SOLN: INTRAVENOUS | Status: DC | PRN

## 2020-09-01 SURGICAL SUPPLY — 25 items

## 2020-09-01 NOTE — Anesthesia Procedure Notes (Signed)
Procedure Name: MAC Date/Time: 09/01/2020 10:26 AM Performed by: Janene Harvey, CRNA Pre-anesthesia Checklist: Patient identified, Emergency Drugs available, Suction available and Patient being monitored Patient Re-evaluated:Patient Re-evaluated prior to induction Oxygen Delivery Method: Nasal cannula Induction Type: IV induction Placement Confirmation: positive ETCO2 Dental Injury: Teeth and Oropharynx as per pre-operative assessment

## 2020-09-01 NOTE — Progress Notes (Signed)
Occupational Therapy Treatment Patient Details Name: Jason [name]. MRN: [id number] DOB: 1946/04/13 Today's Date: 09/01/2020    History of present illness Pt is a 75 y.o. male recently admitted 08/15/20-08/19/20 with upper GIB and duodenal ulcer, now readmitted 08/24/20 with C. difficile collitis, lower GIB. PMH includes afib on Coumadin, peptic ulcer disease, rectal CA, ESRD (HD TTS), CVA, gout, COPD, s/p partial lung lobectomy (2017).   OT comments  Patient status post flexible sigmoidoscopy and remains sleepy post procedure.  Patient attempted edge of bed sitting and light grooming, but requested to be laid back down.  Patient able to take two side steps with Min/Mod A to get higher in the bed.  Limited session due to lethargy and declines out of bed to the recliner.  SNF is recommended post acute to ensure safe transition home.  OT will continue to follow in the acute setting to maximize functional status.    Follow Up Recommendations  SNF    Equipment Recommendations  3 in 1 bedside commode    Recommendations for Other Services      Precautions / Restrictions Precautions Precautions: Fall Precaution Comments: per last admission, patient WBAT R LE in CAM boot. Not in room on date of eval Restrictions Weight Bearing Restrictions: No RLE Weight Bearing: Weight bearing as tolerated Other Position/Activity Restrictions: WBAT in CAM boot       Mobility Bed Mobility Overal bed mobility: Needs Assistance Bed Mobility: Supine to Sit;Sit to Supine     Supine to sit: Min assist Sit to supine: Mod assist        Transfers     Transfers: Sit to/from Stand Sit to Stand: Mod assist         General transfer comment: side step to the head of the bed.    Balance   Sitting-balance support: Feet supported;No upper extremity supported Sitting balance-Leahy Scale: Fair     Standing balance support: Bilateral upper extremity supported;During functional activity Standing  balance-Leahy Scale: Poor Standing balance comment: reliant on BUE support                           ADL either performed or assessed with clinical judgement   ADL       Grooming: Wash/dry hands;Wash/dry face;Min guard;Sitting                                                         Cognition Arousal/Alertness: Lethargic Behavior During Therapy: Flat affect Overall Cognitive Status: Within Functional Limits for tasks assessed                                                            Pertinent Vitals/ Pain       Pain Assessment: Faces Faces Pain Scale: Hurts little more Pain Location: abdomen Pain Descriptors / Indicators: Guarding;Tender Pain Intervention(s): Monitored during session  Frequency  Min 2X/week        Progress Toward Goals  OT Goals(current goals can now be found in the care plan section)     Acute Rehab OT Goals Patient Stated Goal: to go to rehab before returning home OT Goal Formulation: With patient/family Time For Goal Achievement: 09/12/20 Potential to Achieve Goals: Good  Plan Discharge plan remains appropriate    Co-evaluation                 AM-PAC OT "6 Clicks" Daily Activity     Outcome Measure   Help from another person eating meals?: None Help from another person taking care of personal grooming?: A Little Help from another person toileting, which includes using toliet, bedpan, or urinal?: A Lot Help from another person bathing (including washing, rinsing, drying)?: A Lot Help from another person to put on and taking off regular upper body clothing?: A Little Help from another person to put on and taking off regular lower body clothing?: A Lot 6 Click Score: 16    End of Session Equipment Utilized During Treatment: Oxygen  OT Visit Diagnosis: Unsteadiness on feet (R26.81);Other  abnormalities of gait and mobility (R26.89);Muscle weakness (generalized) (M62.81);Pain   Activity Tolerance Patient limited by fatigue   Patient Left in chair;with call bell/phone within reach;with chair alarm set   Nurse Communication          Time: [phone] OT Time Calculation (min): 12 min  Charges: OT General Charges $OT Visit: 1 Visit OT Treatments $Therapeutic Activity: 8-22 mins  09/01/2020  Rich, OTR/L  Acute Rehabilitation Services  Office:  [phone]    Metta Clines 09/01/2020, 4:10 PM

## 2020-09-01 NOTE — Consult Note (Addendum)
Palliative Medicine Inpatient Consult Note  Reason for consult:  Goals of Care  HPI:  Per intake H&P --> 75 year old gentleman with extensive medical issues and comorbidities including history of chronic A. fib on Coumadin, peptic ulcer disease, ESRD on hemodialysis TTS schedule, history of rectal cancer status post low anterior resection and reversal of ileostomy who presented to the ER with abdominal cramping, nausea vomiting diarrhea and rectal bleeding.  Patient was recently in the hospital 2/9-2/13 with upper GI bleeding and found to have bleeding duodenal ulcer treated with epinephrine injection and clipping.  Discharged on Protonix twice daily. Patient also has chronic diarrhea. Admitted with rectal bleeding, C. difficile infection and diarrhea and anemia.  Patient was hemodynamically stable in the ER. Admitted with C. diff, rectal bleeding. Followed by GI.  Palliative care was consulted in the setting of difficult to treat conditions to discuss goals of care.  Clinical Assessment/Goals of Care:  *Please note that this is a verbal dictation therefore any spelling or grammatical errors are due to the "[place]" system interpretation.  I have reviewed medical records including EPIC notes, labs and imaging, received report from bedside RN, assessed the patient who was somnolent.    I met with [name]'s wife, Jason [name]  to further discuss diagnosis prognosis, GOC, EOL wishes, disposition and options.   I introduced Palliative Medicine as specialized medical care for people living with serious illness. It focuses on providing relief from the symptoms and stress of a serious illness. The goal is to improve quality of life for both the patient and the family.  I asked Jason [name] to tell me about Jason [name]. She shares with me that he is from [place], [place]. He and Jason [name] have been married for the past 34 years. They share two sons, [name] and Dunlap. They have six grandchildren and  one great grandchild. Travarius use to work for [employer] in [employer] and the at [employer]. He loves sports. 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  Prior to hospitalization North was living in his home with Jason [name] and able to complete all bADLs.He uses a rollator for distances.   I asked Jason [name] what she understood about [name]'s hospitalization. She states that she does not know much about his present state. We reviewed his medical chart and his notes since admission. We discussed his notable debility and the concern about his ability to improve in the setting of the difficult to treat infections such as C.Diff and his active GIB that he is afflicted by.   Jason [name] shares with me that Jason [name] has had a hard time since being initiated on dialysis and that it has taken a toll on him overall. It has contributed to a lack of appetite and weight loss.  A detailed discussion was had today regarding advanced directives - Jason [name] shares that she and Giankarlo never completed these.  Concepts specific to code status, artifical feeding and hydration, continued IV antibiotics and rehospitalization was had. We reviewed that if [facility] underwent a cardiopulmonary event it could result in trauma to his body without great benefit. She expresses understanding of this. We reviewed together a MOST form which she would like to discuss with her sons prior to completion.  We discussed Jason [name]'s weakness and how it has been recommended that he go to skilled nursing after this hospital stay to optimize his strength.  We discussed the overall picture for [facility] and how right now he is in a tenuous spot whereby  he may continue to decline further at which point additional decisions would need to be made for his care. Jason [name] expresses understanding of this.   Discussed the importance of continued conversation with family and their  medical providers regarding overall plan of care and treatment options,  ensuring decisions are within the context of the patients values and GOCs.  Provided "Hard Choices for [employer]" booklet.   Decision Maker: [name] (spouse) [phone]  SUMMARY OF RECOMMENDATIONS   Full code for the time being, Jason [name] is discussing this with her sons prior to making a decision but shares that she is leaning more towards DNR though she would ideally wish for Kei to participate in this conversation. Given that Jason [name] is more somnolent today we agreed to readdress this in the oncoming days  Appreciate Dr. Sloan Leiter providing patients son(s) a comprehensive medical update  TOC - OP Palliative Support  Ongoing Palliative Care support if Bluford continues to decline we plan to arrange for a family meeting  Code Status/Advance Care Planning: FULL CODE   Palliative Prophylaxis:   Oral Care, Mobility, Pain control, Constipation management, Delirium Precuations  Additional Recommendations (Limitations, Scope, Preferences):  Continue current scope of care   Psycho-social/Spiritual:   Desire for further Chaplaincy support: Yes  Additional Recommendations: Education on chronic disease conditions   Prognosis: Poor given co-morbid conditions and   Discharge Planning: Patient will likely discharge to SNF with OP Palliative support  Vitals:   09/01/20 1228 09/01/20 1300  BP: (!) 104/52 130/64  Pulse: 98 (!) 104  Resp: (!) 21 18  Temp:  97.6 F (36.4 C)  SpO2: 98% 100%    Intake/Output Summary (Last 24 hours) at 09/01/2020 1354 Last data filed at 09/01/2020 1159 Gross per 24 hour  Intake 550 ml  Output --  Net 550 ml   Last Weight  Most recent update: 08/30/2020 12:12 PM   Weight  56.2 kg (123 lb 14.4 oz)           Gen:  Older AA male, chronically ill appearing HEENT: Dry mucous membranes CV: Irregular rate and rhythm  PULM:  2LPM [place] ABD: soft/nontender  EXT: (+) muscle wasting Neuro: Somnolent  PPS: 30%   This conversation/these  recommendations were discussed with patient primary care team, Dr. Sloan Leiter  Time In: 2694 Time Out: 1500 Total Time: 70 Greater than 50%  of this time was spent counseling and coordinating care related to the above assessment and plan.  [place] Team Team Cell Phone: [phone] Please utilize secure chat with additional questions, if there is no response within 30 minutes please call the above phone number  Palliative Medicine Team providers are available by phone from 7am to 7pm daily and can be reached through the team cell phone.  Should this patient require assistance outside of these hours, please call the patient's attending physician.

## 2020-09-01 NOTE — Progress Notes (Signed)
Patient scheduled for colonoscopy/egd today at 0830. Called for report and requested that consent order be signed and fleet enemas be given that were scheduled previously. RN Alfonse Spruce said dialysis had called for transport, explained patient needed GI bleed treated before he went to dialysis. RN agreed to call dialysis and postpone treatment for after Endo.

## 2020-09-01 NOTE — Anesthesia Postprocedure Evaluation (Signed)
Anesthesia Post Note  Patient: JEANNE TERRANCE Sr.  Procedure(s) Performed: ESOPHAGOGASTRODUODENOSCOPY (EGD) WITH PROPOFOL (N/A ) COLONOSCOPY WITH PROPOFOL (N/A ) BIOPSY     Patient location during evaluation: Endoscopy Anesthesia Type: General Level of consciousness: awake Pain management: pain level controlled Vital Signs Assessment: post-procedure vital signs reviewed and stable Respiratory status: spontaneous breathing, nonlabored ventilation, respiratory function stable and patient connected to nasal cannula oxygen Cardiovascular status: blood pressure returned to baseline and stable Postop Assessment: no apparent nausea or vomiting Anesthetic complications: no   No complications documented.  Last Vitals:  Vitals:   09/01/20 1300 09/01/20 1400  BP: 130/64 109/68  Pulse: (!) 104 99  Resp: 18 13  Temp: 36.4 C 36.7 C  SpO2: 100% 98%    Last Pain:  Vitals:   09/01/20 1400  TempSrc: Oral  PainSc: 0-No pain                 Liliana Dang P Ahana Najera

## 2020-09-01 NOTE — Progress Notes (Signed)
PROGRESS NOTE    Jason [name].  ZOX:[id number] DOB: 13-Jan-1946 DOA: 08/24/2020 PCP: [name], MD    Brief Narrative:  75 year old gentleman with extensive medical issues and comorbidities including history of chronic A. fib on Coumadin, peptic ulcer disease, ESRD on hemodialysis TTS schedule, history of rectal cancer status post low anterior resection and reversal of ileostomy who presented to the ER with abdominal cramping, nausea vomiting diarrhea and rectal bleeding.  Patient was recently in the hospital 2/9-2/13 with upper GI bleeding and found to have bleeding duodenal ulcer treated with epinephrine injection and clipping.  Discharged on Protonix twice daily. Patient also has chronic diarrhea. Admitted with rectal bleeding, C. difficile infection and diarrhea and anemia.  Patient was hemodynamically stable in the ER. Admitted with C. diff, rectal bleeding. Followed by GI.  2/23-2/25, patient started having worse abdominal pain, ongoing nausea, intolerance to diet.  Hypoglycemic episodes.  Ongoing intermittent rectal bleeding. 2/26 with extensive colon changes , possible ischemic changes . EGD with retained food in stomach. Remains in the hospital with ongoing complications and frailty.  Assessment & Plan:   Active Problems:   Hematochezia   GIB (gastrointestinal bleeding)   C. difficile colitis   Generalized abdominal pain   Non-intractable vomiting   Abdominal distension (gaseous)   Gastritis and gastroduodenitis   Noninfectious gastroenteritis   Colon ulcer  # C. difficile colitis with diarrhea and abdominal pain: Initial CT scan with evidence of proctitis.  Patient has history of low anterior resection as well as ileostomy and reversal. Recent colonoscopies were significantly normal with evidence of diverticulosis. Currently on high-dose vancomycin due to persistent abdominal pain, no reliable oral intake.  Will start on Flagyl today. CT abdomen with extensive vascular  blockages but good collaterals. Upper GI endoscopy 2/26, no evidence of bleeding.  Ileus with retained food particles in the abdomen. Colonoscopy 2/26 with extensive radiation changes, strictures and possible ischemic changes. Unable to place NG tube at bedside. GI unable to place NG tube during endoscopy. Discussed with radiology, they are not able to add any more visualization for NG tube decompression. Keep NPO.  #Acute on chronic GI bleeding.   Recent upper GI bleed with duodenal ulcer, on high-dose Protonix, EGD 2/26 with no evidence of active bleeding.   Continues to have ongoing intermittent rectal bleeding, colonoscopy consistent with severe radiation changes, proctitis, ischemic or inflammatory colitis.   Supportive treatment.   He has received total 3 units of PRBC in this hospitalization.   Also came to the hospital with supratherapeutic INR that was reversed.  GI discussing with surgery and vascular surgery. With patient's difficult to treat conditions and worsening status, I will also involve palliative care team.  # Acquired thrombophilia, chronic A. fib on Coumadin with supratherapeutic INR:  presented with INR of 8.8, on Coumadin and probably supratherapeutic due to acute illnesses. Aggressively reversed. Continue to hold Coumadin, no need for further reversal. A. fib is rate controlled.  Not on rate control at home.  Using metoprolol as needed.  # ESRD on hemodialysis: Stable.  Getting dialysis on his schedule.  # Suspected B3 deficiency: Patient recently had vitamin B3 level checked, niacin level 30.  Does not have other evidence of niacin deficiency.  We will put patient on trial of niacin replacement.  No contraindications.  # Hypoglycemic episodes: With prolonged n.p.o. status.  Patient remains on 10% dextrose at 70 mL/h.   We will correlate his peripheral blood sugars with serum level, he may  have falsely low CBG on fingertips.  Patient now with progressive  difficult to treat condition.  Consult palliative care.  DVT prophylaxis: SCDs Start: 08/24/20 2254   Code Status: Full code Family Communication: None today.  [name] has been communicated. Disposition Plan: Status is: Inpatient  Remains inpatient appropriate because:Persistent severe electrolyte disturbances and IV treatments appropriate due to intensity of illness or inability to take PO   Dispo: The patient is from: Home              Anticipated d/c is to: SNF              Anticipated d/c date is: Unknown.              Patient currently is not medically stable to d/c.   Difficult to place patient No  Consultants:   Gastroenterology  Nephrology  Procedures:   Routine dialysis  Antimicrobials:   Vancomycin by mouth 2/18---   Subjective: I examined patient early morning hours.  He was going to endoscopy.  Overnight, noted hypoglycemic episodes with no clinical symptoms and remains on dextrose. Rapid response on 2/25 evening with tachypnea, tachycardia with A. fib more than 150 and rectal bleeding.  Volume resuscitated and overnight did well. Patient himself is sick looking and did not have any complaints for me.  Nursing reported to large bloody bowel movements overnight.   Objective: Vitals:   09/01/20 1210 09/01/20 1220 09/01/20 1228 09/01/20 1300  BP: (!) 108/53 (!) 103/53 (!) 104/52 130/64  Pulse: 99 (!) 103 98 (!) 104  Resp: 16 18 (!) 21 18  Temp:    97.6 F (36.4 C)  TempSrc:    Oral  SpO2: 100% 99% 98% 100%  Weight:      Height:        Intake/Output Summary (Last 24 hours) at 09/01/2020 1326 Last data filed at 09/01/2020 1159 Gross per 24 hour  Intake 550 ml  Output --  Net 550 ml   Filed Weights   08/28/20 1210 08/30/20 0844 08/30/20 1152  Weight: 58.8 kg 56.7 kg 56.2 kg    Examination: General: Frail and debilitated, sick looking gentleman lying in bed.  On 5 L of oxygen. Cardiovascular: S1-S2 normal.  Irregularly irregular.  No  murmurs. Respiratory: Mostly clear.  Bilateral conducted airway sounds. Gastrointestinal: Soft and mild tenderness on deep palpation to upper quadrants.  Bowel sounds present. Ext: Thin and frail. Neuro: Equal power in all extremities.  Is  Data Reviewed: I have personally reviewed following labs and imaging studies  CBC: Recent Labs  Lab 08/28/20 0348 08/29/20 0258 08/30/20 0231 08/31/20 0203 08/31/20 2010 09/01/20 0144 09/01/20 0651  WBC 29.0* 27.3* 18.0* 11.8*  --  24.3* 23.3*  NEUTROABS 25.3* 24.9* 15.4* 9.6*  --  22.2*  --   HGB 8.7* 7.8* 7.0* 9.6* 8.7* 8.9* 9.7*  HCT 25.3* 23.1* 21.7* 30.3* 27.2* 28.4* 30.0*  MCV 89.1 89.5 91.2 85.8  --  86.9 86.5  PLT 431* 417* 414* 270  --  353 856   Basic Metabolic Panel: Recent Labs  Lab 08/25/20 1413 08/26/20 0649 08/28/20 0707 08/30/20 0927 09/01/20 0144 09/01/20 0651  NA 133*  --  133* 137 134* 132*  K 5.3*  --  4.6 4.2 3.6 4.7  CL 96*  --  96* 94* 93* 94*  CO2 19*  --  23 24 24  18*  GLUCOSE 74  --  91 93 136* 108*  BUN 55*  --  49* 48*  40* 44*  CREATININE 9.76*  --  7.81* 7.40* 6.64* 6.82*  CALCIUM 7.5*  --  8.0* 8.7* 8.7* 8.7*  MG  --  1.8  --   --  2.1  --   PHOS 5.9*  --  5.1* 6.3* 4.7* 5.1*   GFR: Estimated Creatinine Clearance: 7.7 mL/min (A) (by C-G formula based on SCr of 6.82 mg/dL (H)). Liver Function Tests: Recent Labs  Lab 08/25/20 1413 08/28/20 0707 08/30/20 0927 09/01/20 0651  ALBUMIN 2.3* 2.4* 2.2* 2.2*   No results for input(s): LIPASE, AMYLASE in the last 168 hours. No results for input(s): AMMONIA in the last 168 hours. Coagulation Profile: Recent Labs  Lab 08/26/20 0649  INR 1.8*   Cardiac Enzymes: No results for input(s): CKTOTAL, CKMB, CKMBINDEX, TROPONINI in the last 168 hours. BNP (last 3 results) No results for input(s): PROBNP in the last 8760 hours. HbA1C: No results for input(s): HGBA1C in the last 72 hours. CBG: Recent Labs  Lab 09/01/20 0023 09/01/20 0106  09/01/20 0408 09/01/20 0902 09/01/20 1310  GLUCAP 42* 72 89 37* 80   Lipid Profile: No results for input(s): CHOL, HDL, LDLCALC, TRIG, CHOLHDL, LDLDIRECT in the last 72 hours. Thyroid Function Tests: No results for input(s): TSH, T4TOTAL, FREET4, T3FREE, THYROIDAB in the last 72 hours. Anemia Panel: No results for input(s): VITAMINB12, FOLATE, FERRITIN, TIBC, IRON, RETICCTPCT in the last 72 hours. Sepsis Labs: No results for input(s): PROCALCITON, LATICACIDVEN in the last 168 hours.  Recent Results (from the past 240 hour(s))  Ova and parasite examination     Status: None   Collection Time: 08/23/20  3:21 PM   Specimen: Stool  Result Value Ref Range Status   MICRO NUMBER: [id number]  Final   SPECIMEN QUALITY: Adequate  Final   Source STOOL  Final   STATUS: FINAL  Final   CONCENTRATE RESULT: No ova or parasites seen  Final   TRICHROME RESULT: No ova or parasites seen  Final   COMMENT:   Final    Routine Ova and Parasite exam may not detect some parasites that occasionally cause diarrheal illness. Cryptosporidium Antigen and/or Cyclospora and Isospora Exam may be ordered to detect these parasites. One negative sample does not necessarily rule out  the presence of a parasitic infection.  For additional information, please refer to https://education.questdiagnostics.com/faq/FAQ203 (This link is being provided for informational/ educational purposes only.)   Resp Panel by RT-PCR (Flu A&B, Covid) Nasopharyngeal Swab     Status: None   Collection Time: 08/24/20  3:40 PM   Specimen: Nasopharyngeal Swab; Nasopharyngeal(NP) swabs in vial transport medium  Result Value Ref Range Status   SARS Coronavirus 2 by RT PCR NEGATIVE NEGATIVE Final    Comment: (NOTE) SARS-CoV-2 target nucleic acids are NOT DETECTED.  The SARS-CoV-2 RNA is generally detectable in upper respiratory specimens during the acute phase of infection. The lowest concentration of SARS-CoV-2 viral copies this assay can detect  is 138 copies/mL. A negative result does not preclude SARS-Cov-2 infection and should not be used as the sole basis for treatment or other patient management decisions. A negative result may occur with  improper specimen collection/handling, submission of specimen other than nasopharyngeal swab, presence of viral mutation(s) within the areas targeted by this assay, and inadequate number of viral copies(<138 copies/mL). A negative result must be combined with clinical observations, patient history, and epidemiological information. The expected result is Negative.  Fact Sheet for Patients:  EntrepreneurPulse.com.au  Fact Sheet for Healthcare Providers:  IncredibleEmployment.be  This test is no t yet approved or cleared by the [place] and  has been authorized for detection and/or diagnosis of SARS-CoV-2 by FDA under an Emergency Use Authorization (EUA). This EUA will remain  in effect (meaning this test can be used) for the duration of the COVID-19 declaration under Section 564(b)(1) of the Act, 21 U.S.C.section 360bbb-3(b)(1), unless the authorization is terminated  or revoked sooner.       Influenza A by PCR NEGATIVE NEGATIVE Final   Influenza B by PCR NEGATIVE NEGATIVE Final    Comment: (NOTE) The Xpert Xpress SARS-CoV-2/FLU/RSV plus assay is intended as an aid in the diagnosis of influenza from Nasopharyngeal swab specimens and should not be used as a sole basis for treatment. Nasal washings and aspirates are unacceptable for Xpert Xpress SARS-CoV-2/FLU/RSV testing.  Fact Sheet for Patients: EntrepreneurPulse.com.au  Fact Sheet for Healthcare Providers: IncredibleEmployment.be  This test is not yet approved or cleared by the [place] FDA and has been authorized for detection and/or diagnosis of SARS-CoV-2 by FDA under an Emergency Use Authorization (EUA). This EUA will remain in effect  (meaning this test can be used) for the duration of the COVID-19 declaration under Section 564(b)(1) of the Act, 21 U.S.C. section 360bbb-3(b)(1), unless the authorization is terminated or revoked.  Performed at [facility], [place] [address], [place], [place] [address]   MRSA PCR Screening     Status: None   Collection Time: 08/26/20  8:05 AM   Specimen: Nasal Mucosa; Nasopharyngeal  Result Value Ref Range Status   MRSA by PCR NEGATIVE NEGATIVE Final    Comment:        The GeneXpert MRSA Assay (FDA approved for NASAL specimens only), is one component of a comprehensive MRSA colonization surveillance program. It is not intended to diagnose MRSA infection nor to guide or monitor treatment for MRSA infections. Performed at [facility], [place] [address] [place], [place] [address]          Radiology Studies: DG Abd 1 View  Result Date: 08/31/2020 CLINICAL DATA:  Nasogastric tube placement EXAM: ABDOMEN - 1 VIEW COMPARISON:  June 08, 2018 FINDINGS: Nasogastric tube is not appreciable. There is gastric and small bowel dilatation with air. Colon does not appear appreciably dilated. No free air evident. IMPRESSION: 1.  Nasogastric tube not appreciable. 2. Gastric and small bowel dilatation. A degree of bowel obstruction must be of concern. 3.  No appreciable free air on supine examination. These results will be called to the ordering clinician or representative by the Radiologist Assistant, and communication documented in the PACS or [employer]. Electronically Signed   By: Lowella Grip III M.D.   On: 08/31/2020 16:21        Scheduled Meds: . sodium chloride   Intravenous Once  . Chlorhexidine Gluconate Cloth  6 each Topical Q0600  . Chlorhexidine Gluconate Cloth  6 each Topical Q0600  . Chlorhexidine Gluconate Cloth  6 each Topical Q0600  . darbepoetin (ARANESP) injection - DIALYSIS  60 mcg Intravenous Q Sat-HD  . doxercalciferol  7 mcg  Intravenous Q T,Th,Sa-HD  . niacin  50 mg Oral TID WC  . pantoprazole (PROTONIX) IV  40 mg Intravenous Q12H  . saccharomyces boulardii  250 mg Oral BID  . vancomycin  500 mg Oral QID   Continuous Infusions: . sodium chloride    . sodium chloride    . dextrose 75 mL/hr at 09/01/20 1022  . metronidazole  LOS: 8 days    Time spent: 30 minutes    Barb Merino, MD [facility] Pager [phone]

## 2020-09-01 NOTE — Progress Notes (Signed)
Family communication: Palliative care team was in communication with patient's wife who requested her 2 sons to be involved and updated. I called patient's son, Jason [name] and Jason [name] on the phone and updated them about current situation including intermittent GI bleeding, severe findings on lower GI colonoscopy, difficult to treat condition, extreme frailty and not having adequate nutrition as well as recently developing events and not improving appropriately as he is supposed to be improving.  Both sons were very appreciative of the call.  Updated current situation.  They would like to visit if possible. We will update palliative care team, if we discuss goal of care or palliation, we can allow them to visit and arrange a meeting.

## 2020-09-01 NOTE — Progress Notes (Signed)
Roylene Reason (wife) called to obtain verbal consent for EGD this AM, verified by this RN and Dyann Ruddle, RN

## 2020-09-01 NOTE — Progress Notes (Addendum)
Hypoglycemic Event  CBG: 41   Treatment: D50 25 mL (12.5 gm)  Symptoms: lethargic  Follow-up CBG: [id number]:[id number] CBG Result:11 - 2 attempts      Time:1009    CBG Result 66  Possible Reasons for Event: Inadequate meal intake  Comments/MD notified:yes    Belinda Block

## 2020-09-01 NOTE — Interval H&P Note (Signed)
History and Physical Interval Note:  09/01/2020 9:50 AM  Jason Cotta Sr.  has presented today for surgery, with the diagnosis of Gastrointestinal bleeding.  The various methods of treatment have been discussed with the patient and family. After consideration of risks, benefits and other options for treatment, the patient has consented to  Procedure(s): ESOPHAGOGASTRODUODENOSCOPY (EGD) WITH PROPOFOL (N/A) COLONOSCOPY WITH PROPOFOL (N/A) as a surgical intervention.  The patient's history has been reviewed, patient examined, no change in status, stable for surgery.  I have reviewed the patient's chart and labs.  Questions were answered to the patient's satisfaction.     Dominic Pea Ab Leaming

## 2020-09-01 NOTE — Anesthesia Preprocedure Evaluation (Addendum)
Anesthesia Evaluation  Patient identified by MRN, date of birth, ID bandGeneral Assessment Comment:Responds to commands  Reviewed: Allergy & Precautions, NPO status , Patient's Chart, lab work & pertinent test results  Airway Mallampati: III  TM Distance: >3 FB Neck ROM: Full    Dental  (+) Edentulous Upper, Edentulous Lower   Pulmonary sleep apnea and Continuous Positive Airway Pressure Ventilation , COPD, former smoker,    Pulmonary exam normal breath sounds clear to auscultation       Cardiovascular hypertension, + dysrhythmias Atrial Fibrillation  Rhythm:Irregular Rate:Tachycardia  ECG: rate 123  ECHO: Echocardiogram 12/23/2019:  Normal LV systolic function with visual EF 50-55%. Left ventricle cavity is normal in size. Mild concentric hypertrophy of the left ventricle.  Normal global wall motion. Indeterminate diastolic filling pattern.  Left atrial cavity is severely dilated. The interatrial Septum is thin and mobile but appears to be intact by 2D and CF Doppler interrogation.  Mild (Grade I) mitral regurgitation.  Mild tricuspid regurgitation.  Mild pulmonic regurgitation.  Compared to previous study 06/2018: LVEF improved from 20-25% to 50-55%.  LAE is now severe and mild MR, TR, and PR are new.   Neuro/Psych  Neuromuscular disease CVA negative psych ROS   GI/Hepatic Neg liver ROS, PUD, rectal cancer s/p radiation   Endo/Other  negative endocrine ROS  Renal/GU ESRF and DialysisRenal disease     Musculoskeletal  (+) Arthritis ,   Abdominal   Peds  Hematology  (+) anemia , HLD Gout   Anesthesia Other Findings Gastrointestinal bleeding  Reproductive/Obstetrics                            Anesthesia Physical Anesthesia Plan  ASA: IV  Anesthesia Plan: MAC   Post-op Pain Management:    Induction: Intravenous  PONV Risk Score and Plan: 1 and Propofol infusion and Treatment may vary  due to age or medical condition  Airway Management Planned: Nasal Cannula  Additional Equipment:   Intra-op Plan:   Post-operative Plan:   Informed Consent: I have reviewed the patients History and Physical, chart, labs and discussed the procedure including the risks, benefits and alternatives for the proposed anesthesia with the patient or authorized representative who has indicated his/her understanding and acceptance.       Plan Discussed with: CRNA  Anesthesia Plan Comments:        Anesthesia Quick Evaluation

## 2020-09-01 NOTE — Progress Notes (Signed)
Called to verify patient had enemas completed and consent had been signed for procedure before endo picked him up. Charge RN explained that a nurse was going to do that at this time, this RN explained we will move patient to a later time since they were not ready to come down.

## 2020-09-01 NOTE — Anesthesia Procedure Notes (Signed)
Procedure Name: Intubation Date/Time: 09/01/2020 10:44 AM Performed by: Janene Harvey, CRNA Pre-anesthesia Checklist: Patient identified, Emergency Drugs available, Suction available and Patient being monitored Patient Re-evaluated:Patient Re-evaluated prior to induction Oxygen Delivery Method: Circle system utilized Preoxygenation: Pre-oxygenation with 100% oxygen Induction Type: IV induction and Rapid sequence Ventilation: Mask ventilation without difficulty Laryngoscope Size: Mac and 4 Grade View: Grade I Tube type: Oral Number of attempts: 1 Airway Equipment and Method: Stylet and Oral airway Placement Confirmation: ETT inserted through vocal cords under direct vision,  positive ETCO2 and breath sounds checked- equal and bilateral Secured at: 22 cm Tube secured with: Tape Dental Injury: Teeth and Oropharynx as per pre-operative assessment

## 2020-09-01 NOTE — Progress Notes (Signed)
This RN just gave report to the Hemodialysis RN who schedule a pick up with the transport service and Endo called and asked to schedule the dialysis treatment for later in the morning. No active orders were present throughout the night for a colonoscopy or any other procedure. Hemodialysis service notified.

## 2020-09-01 NOTE — Progress Notes (Signed)
Hemodialysis called and got report and schedule a transport pick up for patient.  Called wife and notified her about patient's dialysis schedule as she requested.

## 2020-09-01 NOTE — Progress Notes (Signed)
[facility] Progress Note   Subjective:   Patient seen in room, tired. No new c/o    Objective Vitals:   09/01/20 0300 09/01/20 0500 09/01/20 0700 09/01/20 0944  BP: (!) 94/53 (!) 112/55 128/62 (!) 124/52  Pulse:   97 100  Resp: 14 15  13   Temp: 98.6 F (37 C) 98.8 F (37.1 C)  98.8 F (37.1 C)  TempSrc: Oral Oral  Tympanic  SpO2:  92% 100% 100%  Weight:      Height:       Physical Exam General: elderly, ill appearing male in NAD Heart:+tachycardia, RR, no mrg Lungs:mostly CTAB, breath sounds decreased bilaterally Abdomen: soft, mildly tender throughout Extremities:no LE edema Dialysis Access: LU AVF +bruit  OP HD: [facility]  4h  450/500  58kg  3K/2Ca   P2    L AVF    Heparin none  Mircera 50 (to start 2/22) Venofer 50  Hectorol 7   Assessment/Plan: 1. C. Diff colitis/ bloody stool / diarrhea/ abdominal pain . On PO Vanc/Florastor.CTA abd/pelvis competed 2/24 with findings suspicious for ileus, no obstruction. NG placed.  Colonoscopy/EGD planned for today. Per primary/GI.  2. BRBPR/ABLA/ Hx GI bleed/rectal Ca: w/ supra therapeutic INR on admit (during previous admissionEGD2/9with duodenal ulcer, treated w/ clip+ injection). On Protonix. S/p 2u prbcs 2/19. Hgb improved to 9.6 today s/p 1unit pRBC 2/24. . 3. ESRD - HD TTS.HD today upstairs.  4. AFib- on coumadin. Supratherapeutic INR on admission.Reversed with Vit K. Coumadin beingheld. 5. HTN/Volume - BP's normal today.Under dry, does not appear volume overloaded, minimal UF with HD. 6. Anemia CKD --see #1. Hgb9's, has had 3u prbc's here total. Continue aranesp 60qwk q Sat (last 2/20 60 ug sq) 7. MBD CKD --Corr Caok.  Phos elevated. Continue home binders when able to tolerate PO. Hold Sensipar for now.  8. COPD  9. H/o prior rectal cancer - in 2014 treated w/ chemoRx,radiation, laparoscopic low anterior resection with diverting loop ileostomy and subsequent ileostomy takedown.    Rob  Peyton Rossner  MD 09/01/2020, 10:01 AM   Filed Weights   08/28/20 1210 08/30/20 0844 08/30/20 1152  Weight: 58.8 kg 56.7 kg 56.2 kg   No intake or output data in the 24 hours ending 09/01/20 1001  Additional Objective Labs: Basic Metabolic Panel: Recent Labs  Lab 08/30/20 0927 09/01/20 0144 09/01/20 0651  NA 137 134* 132*  K 4.2 3.6 4.7  CL 94* 93* 94*  CO2 24 24 18*  GLUCOSE 93 136* 108*  BUN 48* 40* 44*  CREATININE 7.40* 6.64* 6.82*  CALCIUM 8.7* 8.7* 8.7*  PHOS 6.3* 4.7* 5.1*   Liver Function Tests: Recent Labs  Lab 08/25/20 1006 08/25/20 1413 08/28/20 0707 08/30/20 0927 09/01/20 0651  AST 12*  --   --   --   --   ALT 9  --   --   --   --   ALKPHOS 82  --   --   --   --   BILITOT 0.9  --   --   --   --   PROT 5.2*  --   --   --   --   ALBUMIN 2.3*   < > 2.4* 2.2* 2.2*   < > = values in this interval not displayed.   CBC: Recent Labs  Lab 08/29/20 0258 08/30/20 0231 08/31/20 0203 08/31/20 2010 09/01/20 0144 09/01/20 0651  WBC 27.3* 18.0* 11.8*  --  24.3* 23.3*  NEUTROABS 24.9* 15.4* 9.6*  --  22.2*  --   HGB 7.8* 7.0* 9.6* 8.7* 8.9* 9.7*  HCT 23.1* 21.7* 30.3* 27.2* 28.4* 30.0*  MCV 89.5 91.2 85.8  --  86.9 86.5  PLT 417* 414* 270  --  353 330   Blood Culture    Component Value Date/Time   SDES IN/OUT CATH URINE 08/17/2020 1234   SPECREQUEST  08/17/2020 1234    NONE Performed at [facility] [address] [place], [place] [address]    CULT 4,000 COLONIES/mL ENTEROCOCCUS GALLINARUM (A) 08/17/2020 1234   REPTSTATUS 08/21/2020 FINAL 08/17/2020 1234   CBG: Recent Labs  Lab 08/31/20 2201 09/01/20 0023 09/01/20 0106 09/01/20 0408 09/01/20 0902  GLUCAP 97 42* 72 89 37*   Medications: . [MAR Hold] sodium chloride    . [MAR Hold] sodium chloride    . dextrose 75 mL/hr at 09/01/20 0136   . [MAR Hold] sodium chloride   Intravenous Once  . [MAR Hold] Chlorhexidine Gluconate Cloth  6 each Topical Q0600  . [MAR Hold] Chlorhexidine  Gluconate Cloth  6 each Topical Q0600  . [MAR Hold] Chlorhexidine Gluconate Cloth  6 each Topical Q0600  . [MAR Hold] darbepoetin (ARANESP) injection - DIALYSIS  60 mcg Intravenous Q Sat-HD  . [MAR Hold] doxercalciferol  7 mcg Intravenous Q T,Th,Sa-HD  . [MAR Hold] niacin  50 mg Oral TID WC  . [MAR Hold] pantoprazole (PROTONIX) IV  40 mg Intravenous Q12H  . [MAR Hold] saccharomyces boulardii  250 mg Oral BID  . [MAR Hold] vancomycin  500 mg Oral QID

## 2020-09-01 NOTE — Transfer of Care (Signed)
Immediate Anesthesia Transfer of Care Note  Patient: Jason SALVINO Sr.  Procedure(s) Performed: ESOPHAGOGASTRODUODENOSCOPY (EGD) WITH PROPOFOL (N/A ) COLONOSCOPY WITH PROPOFOL (N/A ) BIOPSY  Patient Location: Endoscopy Unit  Anesthesia Type:General  Level of Consciousness: drowsy  Airway & Oxygen Therapy: Patient Spontanous Breathing and Patient connected to face mask oxygen  Post-op Assessment: Report given to RN and Post -op Vital signs reviewed and stable  Post vital signs: Reviewed  Last Vitals:  Vitals Value Taken Time  BP 95/47 09/01/20 1153  Temp 36.6 C 09/01/20 1150  Pulse 97 09/01/20 1200  Resp 18 09/01/20 1200  SpO2 100 % 09/01/20 1200  Vitals shown include unvalidated device data.  Last Pain:  Vitals:   09/01/20 1150  TempSrc: Axillary  PainSc:       Patients Stated Pain Goal: 3 (82/08/13 8871)  Complications: No complications documented.

## 2020-09-01 NOTE — Op Note (Signed)
[facility] Patient Name: Jason [name] Procedure Date : 09/01/2020 MRN: [id number] Attending MD: Gerrit Heck , MD Date of Birth: 08/05/1945 CSN: [id number] Age: 75 Admit Type: Inpatient Procedure:                Upper GI endoscopy Indications:              Generalized abdominal pain, Abnormal CT of the GI                            tract, Nausea with vomiting Providers:                Gerrit Heck, MD, Erenest Rasher, RN, Laverda Sorenson, Technician, Clearnce Sorrel, CRNA Referring MD:              Medicines:                Monitored Anesthesia Care Complications:            No immediate complications. Estimated Blood Loss:     Estimated blood loss was minimal. Procedure:                Pre-Anesthesia Assessment:                           - Prior to the procedure, a History and Physical                            was performed, and patient medications and                            allergies were reviewed. The patient's tolerance of                            previous anesthesia was also reviewed. The risks                            and benefits of the procedure and the sedation                            options and risks were discussed with the patient.                            All questions were answered, and informed consent                            was obtained. Prior Anticoagulants: The patient has                            taken no previous anticoagulant or antiplatelet                            agents. ASA Grade Assessment: III - A patient with  severe systemic disease. After reviewing the risks                            and benefits, the patient was deemed in                            satisfactory condition to undergo the procedure.                           After obtaining informed consent, the endoscope was                            passed under direct vision. Throughout the                             procedure, the patient's blood pressure, pulse, and                            oxygen saturations were monitored continuously. The                            [id number]-H190 ([id number]) Olympus gastroscope was                            introduced through the mouth, and advanced to the                            third part of duodenum. The upper GI endoscopy was                            technically difficult and complex due to presence                            of fluid and food debris in the stomach. Attempted                            to suction the stomach, but could not fully clear.                            The endoscope was then removed and the patient was                            intubated for airway protection. The endoscope was                            re-introduced, and exam completed as below. The                            patient tolerated the procedure well. Scope In: Scope Out: Findings:      Mildly severe esophagitis with a single small 2-3 mm ulcer with no       bleeding was found at the gastroesophageal junction.      The upper third of the esophagus, middle  third of the esophagus and       lower third of the esophagus were normal.      Retained fluid and small amounts of solid debris was found in the       gastric fundus and in the gastric body. This could not be fully cleared       with endoscope suction, which required endoscope removal with subsequent       intubation for airway protection as above.      Localized mild inflammation characterized by congestion (edema) and       erythema with mild luminal deformity was found in the prepyloric region       of the stomach. There was no remnant ulcer and the pylorus was otherwise       traversable with minimal endoscope pressure. Biopsies were taken with a       cold forceps for histology. Estimated blood loss was minimal.      There was some retained debris adherent to the walls of the 3rd portion       of the  duodenum, which was easily lavaged. Otherwise, the examined       duodenum was normal. Impression:               - Mildly severe esophagitis with no bleeding.                           - Normal upper third of esophagus, middle third of                            esophagus and lower third of esophagus.                           - Retained gastric fluid.                           - Gastritis. Biopsied.                           - Normal examined duodenum.                           - At the conclusion of the case, attempted to place                            NGT endoscopically. However, unable to advance NGT                            through right or left nares into posterior pharynx.                            Made several attempts, but unable to pass or                            visualize endoscopically in the posterior pharynx. Recommendation:           - Perform a colonoscopy today.                           -  To remain NPO.                           - IR consult for fluoroscopic NGT placement. Procedure Code(s):        --- Professional ---                           [phone], Esophagogastroduodenoscopy, flexible,                            transoral; with biopsy, single or multiple Diagnosis Code(s):        --- Professional ---                           K20.90, Esophagitis, unspecified without bleeding                           K29.70, Gastritis, unspecified, without bleeding                           R10.84, Generalized abdominal pain                           R11.2, Nausea with vomiting, unspecified                           R93.3, Abnormal findings on diagnostic imaging of                            other parts of digestive tract CPT copyright 2019 [employer]. All rights reserved. The codes documented in this report are preliminary and upon coder review may  be revised to meet current compliance requirements. Gerrit Heck, MD 09/01/2020 11:53:26 AM Number of  Addenda: 0

## 2020-09-01 NOTE — Op Note (Signed)
[facility] Patient Name: Jason [name] Procedure Date : 09/01/2020 MRN: [id number] Attending MD: Gerrit Heck , MD Date of Birth: [birth year] CSN: [id number] Age: 75 Admit Type: Inpatient Procedure:                Flexible Sigmoidoscopy Indications:              Generalized abdominal pain, Hematochezia, Abnormal                            CT of the GI tract Providers:                Gerrit Heck, MD, Erenest Rasher, RN, Laverda Sorenson, Technician, Clearnce Sorrel, CRNA Referring MD:              Medicines:                Monitored Anesthesia Care Complications:            No immediate complications. Estimated Blood Loss:     Estimated blood loss was minimal. Procedure:                Pre-Anesthesia Assessment:                           - Prior to the procedure, a History and Physical                            was performed, and patient medications and                            allergies were reviewed. The patient's tolerance of                            previous anesthesia was also reviewed. The risks                            and benefits of the procedure and the sedation                            options and risks were discussed with the patient.                            All questions were answered, and informed consent                            was obtained. Prior Anticoagulants: The patient has                            taken no previous anticoagulant or antiplatelet                            agents. ASA Grade Assessment: III - A patient with  severe systemic disease. After reviewing the risks                            and benefits, the patient was deemed in                            satisfactory condition to undergo the procedure.                           After obtaining informed consent, the scope was                            passed under direct vision. The CF-HQ190L ([id number])                             Olympus colonoscope was introduced through the anus                            and advanced to the the descending colon. After                            obtaining informed consent, the scope was passed                            under direct vision.The flexible sigmoidoscopy was                            technically difficult and complex due to poor bowel                            prep. The quality of the bowel preparation was fair. Scope In: 11:08:35 AM Scope Out: 11:30:12 AM Scope Withdrawal Time: 0 hours 19 minutes 8 seconds  Total Procedure Duration: 0 hours 21 minutes 37 seconds  Findings:      The digital rectal exam findings include anal stricture which required       exchange for small caliber slim scope.      Nonbleeding ulcerated mucosa were present in the proximal sigmoid colon,       located 70-90 cm from the anal verge. This was characterized by       scattered, circumferential ulcers, including several deep ulcers.       Severla mucosal biopsies were taken with a cold forceps for histology.       Interestingly, several of the biopsy sites had little to no bleeding.       Estimated blood loss was minimal.      Nonbleeding ulcerated mucosa with no stigmata of recent bleeding was       again found in the mid sigmoid colon, located 30-35 cm from the anal       verge. Biopsies were taken with a cold forceps for histology. Estimated       blood loss was minimal. In between these areas, and the in the       rectosigmoid colon, the mucosa was otherwise normal appearing. There was       a single diverticulum in the sigmoid.      The rectumw as  characterized by bonbleeding ulcerated mucosa and       significant luminal narrowing, starting at 18 cm from the anal verge.       Biopsies were taken with a cold forceps for histology. Estimated blood       loss was minimal.      Stool was found in the recto-sigmoid colon, in the sigmoid colon and in       the distal descending colon,  interfering with visualization. Lavage of       the area was performed using copious amounts of tap water, resulting in       clearance with fair visualization. There was significant stool in the       distal descending/proximal sigmoid which precluded any further       advancement of the endoscope. Impression:               - Preparation of the colon was fair.                           - Anal stricture found on digital rectal exam.                           - Patches of severely ulcerated mucosa in the                            sigmoid colon as outlined above. The endoscopic                            appearance is suspicious for ischemia vs infection.                            THis was extensively biopsied.                           - Ulcerated, stenotic rectum. This was biopsied.                           - Stool in the recto-sigmoid colon, in the sigmoid                            colon and in the distal descending colon. Recommendation:           - Return patient to PACU for recovery and hopeful                            extubation, then transfer to ward.                           - Continue present medications.                           - Await pathology results.                           - Plan for vascular surgery and colo-rectal surgery  consults.                           - I discussed these results with the patient's wife                            by phone.                           - To remain NPO. Procedure Code(s):        --- Professional ---                           [phone], Sigmoidoscopy, flexible; with biopsy, single                            or multiple Diagnosis Code(s):        --- Professional ---                           K62.4, Stenosis of anus and rectum                           K63.3, Ulcer of intestine                           R10.84, Generalized abdominal pain                           K92.1, Melena (includes Hematochezia)                            R93.3, Abnormal findings on diagnostic imaging of                            other parts of digestive tract CPT copyright 2019 [employer] Association. All rights reserved. The codes documented in this report are preliminary and upon coder review may  be revised to meet current compliance requirements. Gerrit Heck, MD 09/01/2020 12:10:19 PM Number of Addenda: 0

## 2020-09-02 DIAGNOSIS — Z515 Encounter for palliative care: Secondary | ICD-10-CM | POA: Diagnosis not present

## 2020-09-02 DIAGNOSIS — Z7189 Other specified counseling: Secondary | ICD-10-CM | POA: Diagnosis not present

## 2020-09-02 DIAGNOSIS — K921 Melena: Secondary | ICD-10-CM | POA: Diagnosis not present

## 2020-09-02 DIAGNOSIS — K633 Ulcer of intestine: Secondary | ICD-10-CM | POA: Diagnosis not present

## 2020-09-02 DIAGNOSIS — K56609 Unspecified intestinal obstruction, unspecified as to partial versus complete obstruction: Secondary | ICD-10-CM | POA: Diagnosis present

## 2020-09-02 DIAGNOSIS — R140 Abdominal distension (gaseous): Secondary | ICD-10-CM | POA: Diagnosis not present

## 2020-09-02 DIAGNOSIS — Z66 Do not resuscitate: Secondary | ICD-10-CM

## 2020-09-02 DIAGNOSIS — K2970 Gastritis, unspecified, without bleeding: Secondary | ICD-10-CM | POA: Diagnosis not present

## 2020-09-02 DIAGNOSIS — A0472 Enterocolitis due to Clostridium difficile, not specified as recurrent: Secondary | ICD-10-CM | POA: Diagnosis not present

## 2020-09-02 LAB — GLUCOSE, CAPILLARY
Glucose-Capillary: 39 mg/dL — CL (ref 70–99)
Glucose-Capillary: 111 mg/dL — ABNORMAL HIGH (ref 70–99)
Glucose-Capillary: 59 mg/dL — ABNORMAL LOW (ref 70–99)
Glucose-Capillary: 93 mg/dL (ref 70–99)
Glucose-Capillary: 114 mg/dL — ABNORMAL HIGH (ref 70–99)
Glucose-Capillary: 95 mg/dL (ref 70–99)
Glucose-Capillary: 85 mg/dL (ref 70–99)

## 2020-09-02 LAB — COMPREHENSIVE METABOLIC PANEL
ALT: 9 U/L (ref 0–44)
AST: 15 U/L (ref 15–41)
Albumin: 2.2 g/dL — ABNORMAL LOW (ref 3.5–5.0)
Alkaline Phosphatase: 97 U/L (ref 38–126)
Anion gap: 14 (ref 5–15)
BUN: 24 mg/dL — ABNORMAL HIGH (ref 8–23)
CO2: 27 mmol/L (ref 22–32)
Calcium: 8.4 mg/dL — ABNORMAL LOW (ref 8.9–10.3)
Chloride: 96 mmol/L — ABNORMAL LOW (ref 98–111)
Creatinine, Ser: 4.3 mg/dL — ABNORMAL HIGH (ref 0.61–1.24)
GFR, Estimated: 14 mL/min — ABNORMAL LOW (ref >60)
Glucose, Bld: 88 mg/dL (ref 70–99)
Potassium: 3.2 mmol/L — ABNORMAL LOW (ref 3.5–5.1)
Sodium: 137 mmol/L (ref 135–145)
Total Bilirubin: 0.8 mg/dL (ref 0.3–1.2)
Total Protein: 5.8 g/dL — ABNORMAL LOW (ref 6.5–8.1)

## 2020-09-02 LAB — CBC WITH DIFFERENTIAL/PLATELET
Abs Immature Granulocytes: 0 10*3/uL (ref 0.00–0.07)
Basophils Absolute: 0 10*3/uL (ref 0.0–0.1)
Basophils Relative: 0 %
Eosinophils Absolute: 0 10*3/uL (ref 0.0–0.5)
Eosinophils Relative: 0 %
HCT: 28.3 % — ABNORMAL LOW (ref 39.0–52.0)
Hemoglobin: 9.1 g/dL — ABNORMAL LOW (ref 13.0–17.0)
Lymphocytes Relative: 2 %
Lymphs Abs: 0.4 10*3/uL — ABNORMAL LOW (ref 0.7–4.0)
MCH: 27.3 pg (ref 26.0–34.0)
MCHC: 32.2 g/dL (ref 30.0–36.0)
MCV: 85 fL (ref 80.0–100.0)
Monocytes Absolute: 1 10*3/uL (ref 0.1–1.0)
Monocytes Relative: 5 %
Neutro Abs: 18.1 10*3/uL — ABNORMAL HIGH (ref 1.7–7.7)
Neutrophils Relative %: 93 %
Platelets: 318 10*3/uL (ref 150–400)
RBC: 3.33 MIL/uL — ABNORMAL LOW (ref 4.22–5.81)
RDW: 21.4 % — ABNORMAL HIGH (ref 11.5–15.5)
WBC: 19.5 10*3/uL — ABNORMAL HIGH (ref 4.0–10.5)
nRBC: 0 % (ref 0.0–0.2)
nRBC: 0 /100 WBC

## 2020-09-02 LAB — GLUCOSE, RANDOM: Glucose, Bld: 86 mg/dL (ref 70–99)

## 2020-09-02 MED ORDER — POTASSIUM CHLORIDE CRYS ER 20 MEQ PO TBCR
40.0000 meq | EXTENDED_RELEASE_TABLET | Freq: Once | ORAL | Status: AC
  Administered 2020-09-02: 40 meq via ORAL
  Filled 2020-09-02: qty 2

## 2020-09-02 MED ORDER — DEXTROSE 50 % IV SOLN
INTRAVENOUS | Status: AC
  Administered 2020-09-02: 50 mL
  Filled 2020-09-02: qty 50

## 2020-09-02 MED ORDER — DARBEPOETIN ALFA 60 MCG/0.3ML IJ SOSY
PREFILLED_SYRINGE | INTRAMUSCULAR | Status: AC
  Filled 2020-09-02: qty 0.3

## 2020-09-02 MED ORDER — DOXERCALCIFEROL 4 MCG/2ML IV SOLN
INTRAVENOUS | Status: AC
  Filled 2020-09-02: qty 4

## 2020-09-02 NOTE — Progress Notes (Signed)
Pt came back from dialysis and Blood sugar is 39, call phlebotomy to draw the am labs and patient will be treated and notified the MD.

## 2020-09-02 NOTE — Progress Notes (Signed)
1 Amp of D50 Given

## 2020-09-02 NOTE — Progress Notes (Signed)
PT Cancellation Note  Patient Details Name: Jason NICKLES Sr. MRN: [id number] DOB: 1945/11/30   Cancelled Treatment:    Reason Eval/Treat Not Completed: Fatigue/lethargy limiting ability to participate Pt declining physical therapy session due to fatigue.  Wyona Almas, PT, DPT Acute Rehabilitation Services Pager [phone] Office [phone]    Deno Etienne 09/02/2020, 2:57 PM

## 2020-09-02 NOTE — Consult Note (Signed)
Reason for Consult:GI bleed Referring Physician: Dr. Frederica Kuster Sr. is an 75 y.o. male.  HPI: The patient is a 75 year old black male who initially began having problems a couple weeks ago when he hit his leg on a table and sustained a wound. He was treated with abx and was taking ibuprofen for pain. He developed and UGI bleed and was hospitalized and found to have a gastric ulcer. He improved but then developed diarrhea. He was found to have c dif colitis. He had a coloscopy yesterday that showed ischemic changes in the sigmoid and rectum ulcerations. He has not been transfused in about a week and hg is stable now. Wbc today is 19 down from 23. He was on coumadin at home for afib and was supratherapeutic when he first came in. He also has a history of colon cancer for which he has had an LAR and chemo/radiation.  Past Medical History:  Diagnosis Date  . Anemia   . Arthritis   . Atrial fibrillation ([place])    on Coumadin   . Chronic renal insufficiency 07/31/2011  . COPD (chronic obstructive pulmonary disease) ([place])    pt denies  . CVA (cerebral vascular accident) ([place]) 06/2018  . Deficiency anemia 07/20/2012  . Diverticulosis   . Edema 09/21/2010   Qualifier: Diagnosis of  By: Jerold Coombe    . ERECTILE DYSFUNCTION, MILD 05/11/2007   Qualifier: Diagnosis of  By: Sherren Mocha MD, Jory Ee   . Exertional dyspnea 10/30/2015  . GOUT 05/11/2007   Qualifier: Diagnosis of  By: Sherren Mocha MD, Jory Ee   . Hereditary and idiopathic peripheral neuropathy 09/14/2014  . History of radiation therapy 02/21/13-03/31/13   rectum 50.4Gy total dose  . Hypersomnia 10/30/2015  . Hypertension   . Lung nodule 10/30/2015  . OAB (overactive bladder) 01/09/2016  . Obesity (BMI 30.0-34.9) 10/30/2015  . Obstructive sleep apnea 07/29/2010   HST 11/2015 AHI 54.  On autocpap  >> 10 cm   . Peripheral edema 09/19/2013  . rectal ca dx'd 02/01/13   rectal. Radiation and chemotherapy- remains on chemotherapy-next tx. 08-22-13.    . S/P partial lobectomy of lung 11/20/2015  . S/P thoracotomy   . Sleep apnea    wears cpap   . SUI (stress urinary incontinence), male 01/09/2016    Past Surgical History:  Procedure Laterality Date  . A/V FISTULAGRAM Left 06/14/2018   Procedure: A/V FISTULAGRAM;  Surgeon: Waynetta Sandy, MD;  Location: [facility];  Service: Cardiovascular;  Laterality: Left;  . AV FISTULA PLACEMENT Left 03/24/2016   Procedure: CREATION OF LEFT BRACIOCEPHALIC ARTERIOVENOUS (AV) FISTULA;  Surgeon: Elam Dutch, MD;  Location: [facility];  Service: Vascular;  Laterality: Left;  . BIOPSY  09/17/2018   Procedure: BIOPSY;  Surgeon: Doran Stabler, MD;  Location: [facility];  Service: Gastroenterology;;  . BIOPSY  03/09/2019   Procedure: BIOPSY;  Surgeon: Doran Stabler, MD;  Location: [facility];  Service: Gastroenterology;;  . BIOPSY  11/22/2019   Procedure: BIOPSY;  Surgeon: Doran Stabler, MD;  [facility];  Service: Gastroenterology;;  . COLON SURGERY  05/20/2013  . COLONOSCOPY  2010   [facility]  . COLONOSCOPY WITH PROPOFOL N/A 09/17/2018   Procedure: COLONOSCOPY WITH PROPOFOL;  Surgeon: Doran Stabler, MD;  [facility];  Service: Gastroenterology;  Laterality: N/A;  . COLONOSCOPY WITH PROPOFOL N/A 11/22/2019   Procedure: COLONOSCOPY WITH PROPOFOL;  Surgeon: Wilfrid Lund  L III, MD;  [facility];  Service: Gastroenterology;  Laterality: N/A;  Hx of colon polyps tt   . ESOPHAGOGASTRODUODENOSCOPY (EGD) WITH PROPOFOL N/A 09/17/2018   Procedure: ESOPHAGOGASTRODUODENOSCOPY (EGD) WITH PROPOFOL;  Surgeon: Doran Stabler, MD;  [facility];  Service: Gastroenterology;  Laterality: N/A;  . ESOPHAGOGASTRODUODENOSCOPY (EGD) WITH PROPOFOL N/A 03/09/2019   Procedure: ESOPHAGOGASTRODUODENOSCOPY (EGD) WITH PROPOFOL;  Surgeon: Doran Stabler, MD;  [facility];  Service: Gastroenterology;  Laterality: N/A;  .  ESOPHAGOGASTRODUODENOSCOPY (EGD) WITH PROPOFOL N/A 08/15/2020   Procedure: ESOPHAGOGASTRODUODENOSCOPY (EGD) WITH PROPOFOL;  Surgeon: Yetta Flock, MD;  Location: [facility];  Service: Gastroenterology;  Laterality: N/A;  . EUS N/A 02/03/2013   Procedure: LOWER ENDOSCOPIC ULTRASOUND (EUS);  Surgeon: Milus Banister, MD;  Location: [facility];  Service: Endoscopy;  Laterality: N/A;  . HEMOSTASIS CLIP PLACEMENT  11/22/2019   Procedure: HEMOSTASIS CLIP PLACEMENT;  Surgeon: Doran Stabler, MD;  [facility];  Service: Gastroenterology;;  . HEMOSTASIS CLIP PLACEMENT  08/15/2020   Procedure: HEMOSTASIS CLIP PLACEMENT;  Surgeon: Yetta Flock, MD;  Location: [facility];  Service: Gastroenterology;;  . HEMOSTASIS CONTROL  08/15/2020   Procedure: HEMOSTASIS CONTROL;  Surgeon: Yetta Flock, MD;  Location: [facility];  Service: Gastroenterology;;  . Lucianne Lei CLOSURE N/A 08/24/2013   Procedure: CLOSURE OF LOOP ILEOSTOMY ;  Surgeon: Leighton Ruff, MD;  [facility];  Service: General;  Laterality: N/A;  . IR ANGIO VERTEBRAL SEL SUBCLAVIAN INNOMINATE BILAT MOD SED  06/08/2018  . IR CT HEAD LTD  06/08/2018  . IR INTRAVSC STENT CERV CAROTID W/O EMB-PROT MOD SED INC ANGIO  06/08/2018  . IR PERCUTANEOUS ART THROMBECTOMY/INFUSION INTRACRANIAL INC DIAG ANGIO  06/08/2018  . LAPAROSCOPIC LOW ANTERIOR RESECTION N/A 05/20/2013   Procedure: LAPAROSCOPIC LOW ANTERIOR RESECTION WITH SPLENIC FLEXURE MOBILIZATION;  Surgeon: Leighton Ruff, MD;  [facility];  Service: General;  Laterality: N/A;  . OSTOMY N/A 05/20/2013   Procedure: diverting OSTOMY;  Surgeon: Leighton Ruff, MD;  [facility];  Service: General;  Laterality: N/A;  . PERIPHERAL VASCULAR INTERVENTION Left 06/14/2018   Procedure: PERIPHERAL VASCULAR INTERVENTION;  Surgeon: Waynetta Sandy, MD;  Location: [facility];  Service: Cardiovascular;  Laterality: Left;  AVF on the Left  . POLYPECTOMY   11/22/2019   Procedure: POLYPECTOMY;  Surgeon: Doran Stabler, MD;  Location: [facility];  Service: Gastroenterology;;  . RADIOLOGY WITH ANESTHESIA N/A 06/08/2018   Procedure: IR WITH ANESTHESIA;  Surgeon: Radiologist, Medication, MD;  Location: [facility];  Service: Radiology;  Laterality: N/A;  . REVISON OF ARTERIOVENOUS FISTULA Left 72/53/6644   Procedure: PLICATION/REVISON OF LEFT BRACHIOCEPHALIC ARTERIOVENOUS FISTULA;  Surgeon: Elam Dutch, MD;  Location: [facility];  Service: Vascular;  Laterality: Left;  . UPPER GASTROINTESTINAL ENDOSCOPY    . VIDEO ASSISTED THORACOSCOPY (VATS)/WEDGE RESECTION Left 11/20/2015   Procedure: VIDEO ASSISTED THORACOSCOPY (VATS)/LUNG RESECTION;  Surgeon: [name], MD;  Location: [facility];  Service: Thoracic;  Laterality: Left;  Marland Kitchen VIDEO BRONCHOSCOPY N/A 11/20/2015   Procedure: VIDEO BRONCHOSCOPY;  Surgeon: [name], MD;  [facility];  Service: Thoracic;  Laterality: N/A;    Family History  Problem Relation Age of Onset  . Stroke Mother   . Hypertension Mother   . Stroke Father   . Hypertension Father   . Heart disease Father   . CVA Father   . Hypertension Other   . Colon cancer Neg Hx   .  Colon polyps Neg Hx   . Esophageal cancer Neg Hx   . Rectal cancer Neg Hx   . Stomach cancer Neg Hx     Social History:  reports that he quit smoking about 43 years ago. His smoking use included cigarettes. He has a 5.00 pack-year smoking history. He has never used smokeless tobacco. He reports previous alcohol use. He reports that he does not use drugs.  Allergies:  Allergies  Allergen Reactions  . Nsaids Other (See Comments)    Asked by surgeon to add this medication class as intolerance due to patients renal insufficiency.  . Lactase Diarrhea  . Lactose Intolerance ([place]) Diarrhea  . Phentermine Other (See Comments)    Unknown reaction  . Amlodipine Other (See Comments)    Edema     Medications: I have reviewed the patient's current  medications.  Results for orders placed or performed during the hospital encounter of 08/24/20 (from the past 48 hour(s))  Glucose, capillary     Status: Abnormal   Collection Time: 08/31/20  5:14 PM  Result Value Ref Range   Glucose-Capillary <10 (LL) 70 - 99 mg/dL    Comment: Glucose reference range applies only to samples taken after fasting for at least 8 hours.  Glucose, capillary     Status: Abnormal   Collection Time: 08/31/20  5:17 PM  Result Value Ref Range   Glucose-Capillary <10 (LL) 70 - 99 mg/dL    Comment: Glucose reference range applies only to samples taken after fasting for at least 8 hours.  Glucose, capillary     Status: Abnormal   Collection Time: 08/31/20  5:28 PM  Result Value Ref Range   Glucose-Capillary 62 (L) 70 - 99 mg/dL    Comment: Glucose reference range applies only to samples taken after fasting for at least 8 hours.  Glucose, capillary     Status: Abnormal   Collection Time: 08/31/20  5:46 PM  Result Value Ref Range   Glucose-Capillary 122 (H) 70 - 99 mg/dL    Comment: Glucose reference range applies only to samples taken after fasting for at least 8 hours.  Glucose, capillary     Status: None   Collection Time: 08/31/20  6:18 PM  Result Value Ref Range   Glucose-Capillary 95 70 - 99 mg/dL    Comment: Glucose reference range applies only to samples taken after fasting for at least 8 hours.  Blood gas, arterial     Status: Abnormal   Collection Time: 08/31/20  6:40 PM  Result Value Ref Range   FIO2 40.00    pH, Arterial 7.481 (H) 7.350 - 7.450   pCO2 arterial 28.8 (L) 32.0 - 48.0 mmHg   pO2, Arterial 55.2 (L) 83.0 - 108.0 mmHg   Bicarbonate 21.3 20.0 - 28.0 mmol/L   Acid-base deficit 1.8 0.0 - 2.0 mmol/L   O2 Saturation 89.7 %   Patient temperature 36.6    Collection site RIGHT BRACHIAL    Drawn by COLLECTED BY RT     Comment: D.WIMBISH   Sample type ARTERIAL DRAW    Allens test (pass/fail) PASS PASS    Comment: Performed at [facility], [place] [address] [place], [place] [address]  Glucose, capillary     Status: Abnormal   Collection Time: 08/31/20  8:00 PM  Result Value Ref Range   Glucose-Capillary 64 (L) 70 - 99 mg/dL    Comment: Glucose reference range applies only to samples taken after fasting for at least  8 hours.  Hemoglobin and hematocrit, blood     Status: Abnormal   Collection Time: 08/31/20  8:10 PM  Result Value Ref Range   Hemoglobin 8.7 (L) 13.0 - 17.0 g/dL   HCT 27.2 (L) 39.0 - 52.0 %    Comment: Performed at [facility] [address] [place], [place] [address]  Glucose, capillary     Status: None   Collection Time: 08/31/20  8:45 PM  Result Value Ref Range   Glucose-Capillary 90 70 - 99 mg/dL    Comment: Glucose reference range applies only to samples taken after fasting for at least 8 hours.  Glucose, capillary     Status: None   Collection Time: 08/31/20 10:01 PM  Result Value Ref Range   Glucose-Capillary 97 70 - 99 mg/dL    Comment: Glucose reference range applies only to samples taken after fasting for at least 8 hours.  Glucose, capillary     Status: Abnormal   Collection Time: 09/01/20 12:23 AM  Result Value Ref Range   Glucose-Capillary 42 (LL) 70 - 99 mg/dL    Comment: Glucose reference range applies only to samples taken after fasting for at least 8 hours.   Comment 1 Notify RN    Comment 2 Document in Chart   Glucose, capillary     Status: None   Collection Time: 09/01/20  1:06 AM  Result Value Ref Range   Glucose-Capillary 72 70 - 99 mg/dL    Comment: Glucose reference range applies only to samples taken after fasting for at least 8 hours.   Comment 1 Notify RN    Comment 2 Document in Chart   CBC with Differential/Platelet     Status: Abnormal   Collection Time: 09/01/20  1:44 AM  Result Value Ref Range   WBC 24.3 (H) 4.0 - 10.5 K/uL   RBC 3.27 (L) 4.22 - 5.81 MIL/uL   Hemoglobin 8.9 (L) 13.0 - 17.0 g/dL   HCT 28.4 (L) 39.0 - 52.0 %   MCV 86.9 80.0 - 100.0 fL    MCH 27.2 26.0 - 34.0 pg   MCHC 31.3 30.0 - 36.0 g/dL   RDW 22.5 (H) 11.5 - 15.5 %   Platelets 353 150 - 400 K/uL   nRBC 0.1 0.0 - 0.2 %   Neutrophils Relative % 92 %   Neutro Abs 22.2 (H) 1.7 - 7.7 K/uL   Lymphocytes Relative 2 %   Lymphs Abs 0.5 (L) 0.7 - 4.0 K/uL   Monocytes Relative 5 %   Monocytes Absolute 1.3 (H) 0.1 - 1.0 K/uL   Eosinophils Relative 0 %   Eosinophils Absolute 0.0 0.0 - 0.5 K/uL   Basophils Relative 0 %   Basophils Absolute 0.1 0.0 - 0.1 K/uL   WBC Morphology See Note     Comment: Increased Bands. >20% Bands  Mild Left Shift. 1 to 5% Metas and Myelos, Occ Pro Noted.    Immature Granulocytes 1 %   Abs Immature Granulocytes 0.25 (H) 0.00 - 0.07 K/uL   Polychromasia PRESENT     Comment: Performed at [facility], [place] [address], [place], [place] [address]  Basic metabolic panel     Status: Abnormal   Collection Time: 09/01/20  1:44 AM  Result Value Ref Range   Sodium 134 (L) 135 - 145 mmol/L   Potassium 3.6 3.5 - 5.1 mmol/L   Chloride 93 (L) 98 - 111 mmol/L   CO2 24 22 - 32 mmol/L  Glucose, Bld 136 (H) 70 - 99 mg/dL    Comment: Glucose reference range applies only to samples taken after fasting for at least 8 hours.   BUN 40 (H) 8 - 23 mg/dL   Creatinine, Ser 6.64 (H) 0.61 - 1.24 mg/dL   Calcium 8.7 (L) 8.9 - 10.3 mg/dL   GFR, Estimated 8 (L) >60 mL/min    Comment: (NOTE) Calculated using the CKD-EPI Creatinine Equation (2021)    Anion gap 17 (H) 5 - 15    Comment: Performed at [facility] [address] [place], [place] [address]  Magnesium     Status: None   Collection Time: 09/01/20  1:44 AM  Result Value Ref Range   Magnesium 2.1 1.7 - 2.4 mg/dL    Comment: Performed at [facility] [address] [place], [place] [address]  Phosphorus     Status: Abnormal   Collection Time: 09/01/20  1:44 AM  Result Value Ref Range   Phosphorus 4.7 (H) 2.5 - 4.6 mg/dL    Comment: Performed at [facility] [address],  [place], [place] [address]  Glucose, capillary     Status: None   Collection Time: 09/01/20  4:08 AM  Result Value Ref Range   Glucose-Capillary 89 70 - 99 mg/dL    Comment: Glucose reference range applies only to samples taken after fasting for at least 8 hours.   Comment 1 Notify RN    Comment 2 Document in Chart   Renal function panel     Status: Abnormal   Collection Time: 09/01/20  6:51 AM  Result Value Ref Range   Sodium 132 (L) 135 - 145 mmol/L   Potassium 4.7 3.5 - 5.1 mmol/L    Comment: DELTA CHECK NOTED   Chloride 94 (L) 98 - 111 mmol/L   CO2 18 (L) 22 - 32 mmol/L   Glucose, Bld 108 (H) 70 - 99 mg/dL    Comment: Glucose reference range applies only to samples taken after fasting for at least 8 hours.   BUN 44 (H) 8 - 23 mg/dL   Creatinine, Ser 6.82 (H) 0.61 - 1.24 mg/dL   Calcium 8.7 (L) 8.9 - 10.3 mg/dL   Phosphorus 5.1 (H) 2.5 - 4.6 mg/dL   Albumin 2.2 (L) 3.5 - 5.0 g/dL   GFR, Estimated 8 (L) >60 mL/min    Comment: (NOTE) Calculated using the CKD-EPI Creatinine Equation (2021)    Anion gap 20 (H) 5 - 15    Comment: Performed at [facility] [address], [place], [place] [address]  CBC     Status: Abnormal   Collection Time: 09/01/20  6:51 AM  Result Value Ref Range   WBC 23.3 (H) 4.0 - 10.5 K/uL   RBC 3.47 (L) 4.22 - 5.81 MIL/uL   Hemoglobin 9.7 (L) 13.0 - 17.0 g/dL   HCT 30.0 (L) 39.0 - 52.0 %   MCV 86.5 80.0 - 100.0 fL   MCH 28.0 26.0 - 34.0 pg   MCHC 32.3 30.0 - 36.0 g/dL   RDW 22.5 (H) 11.5 - 15.5 %   Platelets 330 150 - 400 K/uL   nRBC 0.1 0.0 - 0.2 %    Comment: Performed at [facility], [place] [address], [place], [place] [address]  Glucose, capillary     Status: Abnormal   Collection Time: 09/01/20  9:02 AM  Result Value Ref Range   Glucose-Capillary 37 (LL) 70 - 99  mg/dL    Comment: Glucose reference range applies only to samples taken after fasting for at least 8 hours.  Glucose, capillary     Status: None   Collection Time: 09/01/20   1:10 PM  Result Value Ref Range   Glucose-Capillary 80 70 - 99 mg/dL    Comment: Glucose reference range applies only to samples taken after fasting for at least 8 hours.  Glucose, capillary     Status: Abnormal   Collection Time: 09/01/20  2:55 PM  Result Value Ref Range   Glucose-Capillary 67 (L) 70 - 99 mg/dL    Comment: Glucose reference range applies only to samples taken after fasting for at least 8 hours.  Glucose, capillary     Status: None   Collection Time: 09/01/20  5:28 PM  Result Value Ref Range   Glucose-Capillary 71 70 - 99 mg/dL    Comment: Glucose reference range applies only to samples taken after fasting for at least 8 hours.  Glucose, capillary     Status: Abnormal   Collection Time: 09/01/20  8:29 PM  Result Value Ref Range   Glucose-Capillary 53 (L) 70 - 99 mg/dL    Comment: Glucose reference range applies only to samples taken after fasting for at least 8 hours.  Glucose, random     Status: None   Collection Time: 09/01/20  9:20 PM  Result Value Ref Range   Glucose, Bld 90 70 - 99 mg/dL    Comment: Glucose reference range applies only to samples taken after fasting for at least 8 hours. Performed at [facility], [place] [address] [place], [place] [address]   Glucose, capillary     Status: Abnormal   Collection Time: 09/02/20  3:26 AM  Result Value Ref Range   Glucose-Capillary 39 (LL) 70 - 99 mg/dL    Comment: Glucose reference range applies only to samples taken after fasting for at least 8 hours.   Comment 1 Notify RN   CBC with Differential/Platelet     Status: Abnormal   Collection Time: 09/02/20  3:43 AM  Result Value Ref Range   WBC 19.5 (H) 4.0 - 10.5 K/uL   RBC 3.33 (L) 4.22 - 5.81 MIL/uL   Hemoglobin 9.1 (L) 13.0 - 17.0 g/dL   HCT 28.3 (L) 39.0 - 52.0 %   MCV 85.0 80.0 - 100.0 fL   MCH 27.3 26.0 - 34.0 pg   MCHC 32.2 30.0 - 36.0 g/dL   RDW 21.4 (H) 11.5 - 15.5 %   Platelets 318 150 - 400 K/uL   nRBC 0.0 0.0 - 0.2 %   Neutrophils  Relative % 93 %   Neutro Abs 18.1 (H) 1.7 - 7.7 K/uL   Lymphocytes Relative 2 %   Lymphs Abs 0.4 (L) 0.7 - 4.0 K/uL   Monocytes Relative 5 %   Monocytes Absolute 1.0 0.1 - 1.0 K/uL   Eosinophils Relative 0 %   Eosinophils Absolute 0.0 0.0 - 0.5 K/uL   Basophils Relative 0 %   Basophils Absolute 0.0 0.0 - 0.1 K/uL   WBC Morphology See Note     Comment: Increased Bands. >20% Bands   nRBC 0 0 /100 WBC   Abs Immature Granulocytes 0.00 0.00 - 0.07 K/uL   Polychromasia PRESENT     Comment: Performed at [facility], [place] [address] [place], [place] [address]  Comprehensive metabolic panel     Status: Abnormal   Collection Time: 09/02/20  3:43 AM  Result Value Ref Range   Sodium 137 135 - 145 mmol/L   Potassium 3.2 (L) 3.5 - 5.1 mmol/L   Chloride 96 (L) 98 - 111 mmol/L   CO2 27 22 - 32 mmol/L   Glucose, Bld 88 70 - 99 mg/dL    Comment: Glucose reference range applies only to samples taken after fasting for at least 8 hours.   BUN 24 (H) 8 - 23 mg/dL   Creatinine, Ser 4.30 (H) 0.61 - 1.24 mg/dL    Comment: DELTA CHECK NOTED   Calcium 8.4 (L) 8.9 - 10.3 mg/dL   Total Protein 5.8 (L) 6.5 - 8.1 g/dL   Albumin 2.2 (L) 3.5 - 5.0 g/dL   AST 15 15 - 41 U/L   ALT 9 0 - 44 U/L   Alkaline Phosphatase 97 38 - 126 U/L   Total Bilirubin 0.8 0.3 - 1.2 mg/dL   GFR, Estimated 14 (L) >60 mL/min    Comment: (NOTE) Calculated using the CKD-EPI Creatinine Equation (2021)    Anion gap 14 5 - 15    Comment: Performed at [facility] [address], [place], [place] [address]  Glucose, capillary     Status: Abnormal   Collection Time: 09/02/20  4:29 AM  Result Value Ref Range   Glucose-Capillary 111 (H) 70 - 99 mg/dL    Comment: Glucose reference range applies only to samples taken after fasting for at least 8 hours.  Glucose, capillary     Status: Abnormal   Collection Time: 09/02/20  7:50 AM  Result Value Ref Range   Glucose-Capillary 18 (LL) 70 - 99 mg/dL    Comment: Glucose  reference range applies only to samples taken after fasting for at least 8 hours.  Glucose, capillary     Status: Abnormal   Collection Time: 09/02/20  7:55 AM  Result Value Ref Range   Glucose-Capillary 59 (L) 70 - 99 mg/dL    Comment: Glucose reference range applies only to samples taken after fasting for at least 8 hours.   *Note: Due to a large number of results and/or encounters for the requested time period, some results have not been displayed. A complete set of results can be found in Results Review.    DG Abd 1 View  Result Date: 08/31/2020 CLINICAL DATA:  Nasogastric tube placement EXAM: ABDOMEN - 1 VIEW COMPARISON:  June 08, 2018 FINDINGS: Nasogastric tube is not appreciable. There is gastric and small bowel dilatation with air. Colon does not appear appreciably dilated. No free air evident. IMPRESSION: 1.  Nasogastric tube not appreciable. 2. Gastric and small bowel dilatation. A degree of bowel obstruction must be of concern. 3.  No appreciable free air on supine examination. These results will be called to the ordering clinician or representative by the Radiologist Assistant, and communication documented in the PACS or [employer]. Electronically Signed   By: Lowella Grip III M.D.   On: 08/31/2020 16:21    Review of Systems  Constitutional: Positive for appetite change and fatigue.  HENT: Negative.   Eyes: Negative.   Respiratory: Negative.   Cardiovascular: Positive for leg swelling.  Gastrointestinal: Positive for abdominal pain and diarrhea.  Endocrine: Negative.   Genitourinary: Negative.   Musculoskeletal: Negative.   Skin: Negative.   Allergic/Immunologic: Negative.   Neurological: Negative.   Hematological: Negative.   Psychiatric/Behavioral: Negative.    Blood pressure (!) 114/55, pulse 100, temperature 98.1 F (36.7 C), temperature source Oral, resp. rate 12, height 5'  8" (1.727 m), weight 52.3 kg, SpO2 (!) 69 %. Physical Exam Vitals reviewed.   Constitutional:      Appearance: Normal appearance. He is normal weight.  HENT:     Head: Normocephalic and atraumatic.     Right Ear: External ear normal.     Left Ear: External ear normal.     Nose: Nose normal.     Mouth/Throat:     Mouth: Mucous membranes are dry.     Pharynx: Oropharynx is clear.  Eyes:     Extraocular Movements: Extraocular movements intact.     Conjunctiva/sclera: Conjunctivae normal.     Pupils: Pupils are equal, round, and reactive to light.  Cardiovascular:     Rate and Rhythm: Normal rate. Rhythm irregular.     Pulses: Normal pulses.     Heart sounds: Normal heart sounds.  Pulmonary:     Effort: Pulmonary effort is normal. No respiratory distress.     Breath sounds: Normal breath sounds.  Abdominal:     General: There is no distension.     Palpations: Abdomen is soft.     Comments: There is mild tenderness but no peritonitis  Musculoskeletal:        General: Signs of injury present. Normal range of motion.     Cervical back: Normal range of motion and neck supple.  Skin:    General: Skin is warm and dry.     Coloration: Skin is not jaundiced.  Neurological:     General: No focal deficit present.     Mental Status: He is alert and oriented to person, place, and time.  Psychiatric:        Mood and Affect: Mood normal.        Behavior: Behavior normal.     Assessment/Plan: The patient appears to have c diff colitis with ischemic changes of colon. He does not look toxic at this point. He does seem to be improving somewhat based on wbc and exam. At this point he should continue abx therapy for c diff and monitor him closely. If he fails to improve then he could require surgery which would likely be a colostomy with colon resection. We will follow closely with you.  Autumn Messing III 09/02/2020, 10:00 AM

## 2020-09-02 NOTE — Progress Notes (Signed)
[facility] Progress Note   Subjective:   Patient seen in room, tired. No new c/o. Wife at bedside.   Objective Vitals:   09/02/20 0750 09/02/20 1000 09/02/20 1100 09/02/20 1200  BP: (!) 114/55 111/60 (!) 106/50 (!) 112/46  Pulse: 100   98  Resp: 12 15 12 11   Temp: 98.1 F (36.7 C)   97.6 F (36.4 C)  TempSrc: Oral   Oral  SpO2:    98%  Weight:      Height:       Physical Exam General: elderly, ill appearing male, very tired Heart:+tachycardia, RR, no mrg Lungs:mostly CTAB, breath sounds decreased bilaterally Abdomen: soft, mildly tender throughout Extremities:no LE edema Dialysis Access: LU AVF +bruit  OP HD: [facility] TTS  4h  450/500  58kg  3K/2Ca   P2    L AVF    Heparin none  Mircera 50 (to start 2/22) Venofer 50  Hectorol 7   Assessment/Plan: 1. C. Diff colitis - on po Vanc/Florastor w/ persistent abd pain and diarrhea.CTA abd/pelvis 2/24 suspicious for ileus, no obstruction. NG placed.  Flex sig yest 2/26 showed severely ulcerated mucosa of the sigmoid colon/ rectum, possibly radiation changes vs ischemia vs infection. Per primary/GI.  2. GI bleed/ ABL anemia:  w/ supra therapeutic INR on admit (during previous admissionEGD2/9with duodenal ulcer, treated w/ clip+ injection). On Protonix. S/p 2u prbcs 2/19. Hgb improved to 9's s/p 1unit pRBC 2/24. 3. ESRD - HD TTS. Next HD 3/01.  4. AFib- on coumadin. Supratherapeutic INR on admission.Reversed with Vit K. Coumadin beingheld. 5. HTN/Volume - BP's normal today.Under dry wt, looks a bit dry. Minimal UF with HD. 6. Anemia CKD --see #1. Hgb9's, has had 3u prbc's here total. Continue aranesp 60qwk q Sat (rec'd on 2/20 and 2/27 here) 7. MBD CKD --Corr Caok.  Continue home binders when able to tolerate PO. Hold Sensipar for now.  8. COPD  9. H/o prior rectal cancer - in 2014 treated w/ chemoRx,radiation, laparoscopic low anterior resection with diverting loop ileostomy and subsequent ileostomy  takedown. 10. GOC - seen by palliative care, pt is now DNR    Kelly Splinter  MD 09/01/2020, 10:01 AM   Filed Weights   08/30/20 0844 08/30/20 1152 09/02/20 0433  Weight: 56.7 kg 56.2 kg 52.3 kg    Intake/Output Summary (Last 24 hours) at 09/02/2020 1424 Last data filed at 09/02/2020 0228 Gross per 24 hour  Intake 325 ml  Output 1011 ml  Net -686 ml    Additional Objective Labs: Basic Metabolic Panel: Recent Labs  Lab 08/30/20 0927 09/01/20 0144 09/01/20 0651 09/01/20 2120 09/02/20 0343 09/02/20 0849  NA 137 134* 132*  --  137  --   K 4.2 3.6 4.7  --  3.2*  --   CL 94* 93* 94*  --  96*  --   CO2 24 24 18*  --  27  --   GLUCOSE 93 136* 108* 90 88 86  BUN 48* 40* 44*  --  24*  --   CREATININE 7.40* 6.64* 6.82*  --  4.30*  --   CALCIUM 8.7* 8.7* 8.7*  --  8.4*  --   PHOS 6.3* 4.7* 5.1*  --   --   --    Liver Function Tests: Recent Labs  Lab 08/30/20 0927 09/01/20 0651 09/02/20 0343  AST  --   --  15  ALT  --   --  9  ALKPHOS  --   --  97  BILITOT  --   --  0.8  PROT  --   --  5.8*  ALBUMIN 2.2* 2.2* 2.2*   CBC: Recent Labs  Lab 08/30/20 0231 08/31/20 0203 08/31/20 2010 09/01/20 0144 09/01/20 0651 09/02/20 0343  WBC 18.0* 11.8*  --  24.3* 23.3* 19.5*  NEUTROABS 15.4* 9.6*  --  22.2*  --  18.1*  HGB 7.0* 9.6*   < > 8.9* 9.7* 9.1*  HCT 21.7* 30.3*   < > 28.4* 30.0* 28.3*  MCV 91.2 85.8  --  86.9 86.5 85.0  PLT 414* 270  --  353 330 318   < > = values in this interval not displayed.   Blood Culture    Component Value Date/Time   SDES IN/OUT CATH URINE 08/17/2020 1234   SPECREQUEST  08/17/2020 1234    NONE Performed at [facility] [address] [place], [place] [address]    CULT 4,000 COLONIES/mL ENTEROCOCCUS GALLINARUM (A) 08/17/2020 1234   REPTSTATUS 08/21/2020 FINAL 08/17/2020 1234   CBG: Recent Labs  Lab 09/02/20 0326 09/02/20 0429 09/02/20 0750 09/02/20 0755 09/02/20 1221  GLUCAP 39* 111* 18* 59* 93   Medications: .  sodium chloride    . sodium chloride    . dextrose 75 mL/hr at 09/01/20 1500  . metronidazole 500 mg (09/02/20 0414)   . sodium chloride   Intravenous Once  . Chlorhexidine Gluconate Cloth  6 each Topical Q0600  . Chlorhexidine Gluconate Cloth  6 each Topical Q0600  . Chlorhexidine Gluconate Cloth  6 each Topical Q0600  . darbepoetin (ARANESP) injection - DIALYSIS  60 mcg Intravenous Q Sat-HD  . doxercalciferol      . doxercalciferol  7 mcg Intravenous Q T,Th,Sa-HD  . niacin  50 mg Oral TID WC  . pantoprazole (PROTONIX) IV  40 mg Intravenous Q12H  . saccharomyces boulardii  250 mg Oral BID  . vancomycin  500 mg Oral QID

## 2020-09-02 NOTE — Progress Notes (Signed)
[facility] ROUNDING NOTE   Subjective: EGD and flexible sigmoidoscopy completed yesterday.  EGD with volume of fluid and small amount of retained solid material in the stomach.  This was suctioned endoscopically.  Attempted placing NG tube with endoscopic guidance, but unable to advance NG.  Flexible sigmoidoscopy with area of significant ulceration in the proximal sigmoid colon.  Unable to advance beyond distal descending colon due to significant stool burden.  Second area of patchy ulceration in the mid sigmoid, along with stenotic, ulcerated distal rectum.  Denies any nausea/vomiting overnight/this morning.  Does not think that there was any blood in his stool.  More conversive this morning.  Again issues with blood glucose overnight.  Was treated with amp of D50.   Objective: Vital signs in last 24 hours: Temp:  [97.6 F (36.4 C)-98.8 F (37.1 C)] 98.1 F (36.7 C) (02/27 0750) Pulse Rate:  [36-110] 100 (02/27 0750) Resp:  [12-21] 12 (02/27 0750) BP: (95-130)/(47-68) 114/55 (02/27 0750) SpO2:  [49 %-100 %] 69 % (02/27 0228) Weight:  [52.3 kg] 52.3 kg (02/27 0433) Last BM Date: 09/01/20 General: NAD Abdomen: Diffuse TTP, more so in the lower abdomen.  Soft, ND, +BS   Intake/Output from previous day: 02/26 0701 - 02/27 0700 In: 875 [I.V.:525; IV Piggyback:350] Out: 1011  Intake/Output this shift: No intake/output data recorded.   Lab Results: Recent Labs    09/01/20 0144 09/01/20 0651 09/02/20 0343  WBC 24.3* 23.3* 19.5*  HGB 8.9* 9.7* 9.1*  PLT 353 330 318  MCV 86.9 86.5 85.0   BMET Recent Labs    09/01/20 0144 09/01/20 0651 09/01/20 2120 09/02/20 0343  NA 134* 132*  --  137  K 3.6 4.7  --  3.2*  CL 93* 94*  --  96*  CO2 24 18*  --  27  GLUCOSE 136* 108* 90 88  BUN 40* 44*  --  24*  CREATININE 6.64* 6.82*  --  4.30*  CALCIUM 8.7* 8.7*  --  8.4*   LFT Recent Labs    08/30/20 0927 09/01/20 0651 09/02/20 0343  PROT  --   --  5.8*   ALBUMIN 2.2* 2.2* 2.2*  AST  --   --  15  ALT  --   --  9  ALKPHOS  --   --  97  BILITOT  --   --  0.8   PT/INR No results for input(s): INR in the last 72 hours.    Imaging/Other results: DG Abd 1 View  Result Date: 08/31/2020 CLINICAL DATA:  Nasogastric tube placement EXAM: ABDOMEN - 1 VIEW COMPARISON:  June 08, 2018 FINDINGS: Nasogastric tube is not appreciable. There is gastric and small bowel dilatation with air. Colon does not appear appreciably dilated. No free air evident. IMPRESSION: 1.  Nasogastric tube not appreciable. 2. Gastric and small bowel dilatation. A degree of bowel obstruction must be of concern. 3.  No appreciable free air on supine examination. These results will be called to the ordering clinician or representative by the Radiologist Assistant, and communication documented in the PACS or [employer]. Electronically Signed   By: Lowella Grip III M.D.   On: 08/31/2020 16:21      Assessment and Plan:  1) C. difficile colitis -WBC downtrending and 19.5 today, from 23 yesterday -Continue vancomycin (day 9) and Florastor  2) Nausea/Vomiting 3) Ileus -Unable to endoscopically NGT due to inability to pass tube through posterior pharynx.  Case discussed with IR by primary Hospitalist service, and  does not appear additional fluoroscopic methods available -No nausea/vomiting overnight or this morning -No GOO on endoscopy  4) Hematochezia 5) Colonic Ulceration 6) History of rectal cancer s/p LAR, XRT, chemotherapy -Endoscopic appearance highly suspicious for ischemic disease.  Did have a CTA earlier this admission which demonstrates occlusion of the IMA, but patent celiac and SMA with collateral filling. -Additionally with anal stenosis with rectal stenosis and ulceration from previous surgery/radiation -Consulting General Surgery and Vascular Surgery services  7) Hypoglycemia -Episodic hypoglycemia over the last 2 nights.  Improved mentation this  morning. -Continued management per primary Hospitalist service  8) ESRD -HD TTS  Palliative Care service was consulted yesterday to discuss [place]  with patient's wife and sons.    Lavena Bullion, DO  09/02/2020, 8:35 AM [facility] Pager [phone]

## 2020-09-02 NOTE — Progress Notes (Signed)
Palliative Medicine Inpatient Follow Up Note  Reason for consult:  Goals of Care  HPI:  Per intake H&P --> 75 year old gentleman withextensive medical issues and comorbidities includinghistory of chronic A. fib on Coumadin, peptic ulcer disease, ESRD on hemodialysis TTS schedule, history of rectal cancer status post low anterior resection and reversal of ileostomywho presented to the ER with abdominal cramping, nausea vomiting diarrhea and rectal bleeding. Patient was recently in the hospital 2/9-2/13 with upper GI bleeding and found to have bleeding duodenal ulcer treated with epinephrine injection and clipping. Discharged on Protonix twice daily. Patient also has chronic diarrhea. Admitted with rectal bleeding, C. difficile infection and diarrhea and anemia. Patient was hemodynamically stable in the ER.Admitted with C. diff, rectal bleeding. Followed by GI.  Palliative care was consulted in the setting of difficult to treat conditions to discuss goals of care.  Today's Discussion (09/02/2020):  *Please note that this is a verbal dictation therefore any spelling or grammatical errors are due to the "[place] [facility]" system interpretation.  I met with Jason [name] this morning at bedside. He was alert and oriented today which was a shift from the prior evening. I entered the room as surgery was ending their conversation with Jason [name] and his wife, Jason [name]. Per surgery they would like to see how he does on current tx for C.Diff if he continues to worsen they would entertain the idea of surgery with a colon resection to remove sigmoid bowel ischemia and colostomy placement.   I then spoke to [name], I introduced Palliative Medicine as specialized medical care for people living with serious illness. It focuses on providing relief from the symptoms and stress of a serious illness. The goal is to improve quality of life for both the patient and the family.  Jason [name] and I reviewed his present hospitalization  and the progressive nature of his disease processes. We reviewed the difficult encountered with treatment of his C.Diff in the setting of his UGI and PUD. We reviewed the continued hope for improvements the possibility of further declines.  Jason [name] shares with me that he has lost weight and been declining since 2019 when dialysis was initiated he does share that this has been difficult on his body and spirit.   I introduced the MOST form to [place] and we carefully reviewed each section so that his wishes are well documented. Jason [name]'s wife Jason [name] was present and privy to our conversation.  Cardiopulmonary Resuscitation: Do Not Attempt Resuscitation (DNR/No CPR)  Medical Interventions: Limited Additional Interventions: Use medical treatment, IV fluids and cardiac monitoring as indicated, DO NOT USE intubation or mechanical ventilation. May consider use of less invasive airway support such as BiPAP or CPAP. Also provide comfort measures. Transfer to the hospital if indicated. Avoid intensive care.   Antibiotics: Antibiotics if indicated  IV Fluids: IV fluids if indicated  Feeding Tube: Feeding tube for a defined trial period   We further discussed his personal goals which are ideally to regain mobility. He was quite functional prior to hospital admission.  In regards to the possibility of surgery Jason [name] did share that this is something he would wish to pursue if it were offered.   Question and concerns answered  Objective Assessment: Vital Signs Vitals:   09/02/20 1100 09/02/20 1200  BP: (!) 106/50 (!) 112/46  Pulse:  98  Resp: 12 11  Temp:  97.6 F (36.4 C)  SpO2:  98%    Intake/Output Summary (Last 24 hours) at 09/02/2020 1258 Last data filed at  09/02/2020 0228 Gross per 24 hour  Intake 325 ml  Output 1011 ml  Net -686 ml   Last Weight  Most recent update: 09/02/2020  4:59 AM   Weight  52.3 kg (115 lb 4.8 oz)           Gen:  Older AA male, chronically ill appearing HEENT: Dry mucous  membranes CV: Irregular rate and rhythm  PULM:  2LPM [place] ABD: soft/nontender  EXT: (+) muscle wasting Neuro: Somnolent  SUMMARY OF RECOMMENDATIONS   DNAR/DNI  MOST Completed, paper copy placed onto the chart electric copy can be found in [place]  DNR Form Completed, paper copy placed onto the chart electric copy can be found in [facility]  TOC - OP Palliative Support, ideally through [place] will be liberalized tomorrow hospital wide which will allow for patients sons to visit  Ongoing Palliative support  Time Spent: 35 Greater than 50% of the time was spent in counseling and coordination of care ______________________________________________________________________________________ [place] Team Team Cell Phone: [phone] Please utilize secure chat with additional questions, if there is no response within 30 minutes please call the above phone number  Palliative Medicine Team providers are available by phone from 7am to 7pm daily and can be reached through the team cell phone.  Should this patient require assistance outside of these hours, please call the patient's attending physician.

## 2020-09-02 NOTE — Progress Notes (Signed)
Patient has low blood sugars at peripheral finger stick, verified multiple times as false readings. Doing more studies .  Plan:  no need to correct hypoglycemia aggressively if patient is asymptomatic. Call me for any blood sugar less than 40

## 2020-09-02 NOTE — Progress Notes (Signed)
PROGRESS NOTE    Jason DRESSER Sr.  NID:[id number] DOB: September 09, 1945 DOA: 08/24/2020 PCP: [name], MD    Brief Narrative:  75 year old gentleman with extensive medical issues and comorbidities including history of chronic A. fib on Coumadin, peptic ulcer disease, ESRD on hemodialysis TTS schedule, history of rectal cancer status post low anterior resection and reversal of ileostomy who presented to the ER with abdominal cramping, nausea vomiting diarrhea and rectal bleeding.  Patient was recently in the hospital 2/9-2/13 with upper GI bleeding and found to have bleeding duodenal ulcer treated with epinephrine injection and clipping.  Discharged on Protonix twice daily. Patient also has chronic diarrhea. Admitted with rectal bleeding, C. difficile infection and diarrhea and anemia.  Patient was hemodynamically stable in the ER. Admitted with C. diff, rectal bleeding. Followed by GI.  2/23-2/25, patient started having worse abdominal pain, ongoing nausea, intolerance to diet.  Hypoglycemic episodes.  Ongoing intermittent rectal bleeding. 2/26 with extensive colon changes , possible ischemic changes . EGD with retained food in stomach. Remains in the hospital with ongoing complications and frailty and intolerance to diet.  Assessment & Plan:   Active Problems:   Hematochezia   GIB (gastrointestinal bleeding)   C. difficile colitis   Generalized abdominal pain   Non-intractable vomiting   Abdominal distension (gaseous)   Gastritis and gastroduodenitis   Noninfectious gastroenteritis   Colon ulcer   Intestinal occlusion (HCC)  # C. difficile colitis with diarrhea and abdominal pain, extensive radiation and ischemic changes on the colon: Initial CT scan with evidence of proctitis.  Patient has history of low anterior resection as well as ileostomy and reversal. Recent colonoscopies were significantly normal with evidence of diverticulosis. Currently on high-dose vancomycin due to  persistent abdominal pain, no reliable oral intake.   Started on Flagyl IV 2/26, remains on vancomycin p.o. with unreliable intake. CT abdomen with extensive vascular blockages but good collaterals. Upper GI endoscopy 2/26, no evidence of bleeding.  Ileus with retained food particles in the abdomen. Colonoscopy 2/26 with extensive radiation changes, strictures and possible ischemic changes. Patient with improved abdominal pain and good bowel function today, will challenge with sips of water and slowly advance to clears.  #Acute on chronic GI bleeding.   Recent upper GI bleed with duodenal ulcer, on high-dose Protonix, EGD 2/26 with no evidence of active bleeding.   Continues to have ongoing intermittent rectal bleeding, colonoscopy consistent with severe radiation changes, proctitis, ischemic or inflammatory colitis.   Supportive treatment.   He has received total 3 units of PRBC in this hospitalization.   Also came to the hospital with supratherapeutic INR that was reversed.  GI discussing with surgery and vascular surgery. If incomplete improvement of colitis, may need colostomy.  Surgery on board.  # Acquired thrombophilia, chronic A. fib on Coumadin with supratherapeutic INR:  presented with INR of 8.8, on Coumadin and probably supratherapeutic due to acute illnesses. Aggressively reversed. Continue to hold Coumadin, no need for further reversal. A. fib is rate controlled.  Not on rate control at home.  Using metoprolol as needed.  # ESRD on hemodialysis: Stable.  Getting dialysis on his schedule.  # Suspected B3 deficiency: Patient recently had vitamin B3 level checked, niacin level 30.  Does not have other evidence of niacin deficiency.  We will put patient on trial of niacin replacement.  No contraindications.  # Hypoglycemic episodes: With prolonged n.p.o. status.  Patient remains on 10% dextrose at 50 mL/h.  We will check insulin  levels and C-peptide today.  Less likely  diagnostic. Evidence of significant abnormal peripheral blood sugars on finger prick.  Evidence of false reading personally verified as below.  2/27, 7:50 AM Right finger prick 18 Left finger prick 59 Serum glucose 86 and patient is alert awake oriented and communicating with the provider.  3:26 AM Finger prick 39, patient asymptomatic.  Alert awake.  Heart rate is 70.  Hungry. BMP with blood sugar 88.  2/26, 8 PM Finger prick 53 Serum glucose 90 without sugar administration.  Only treated for symptomatic hypoglycemia.  We will continue on 50 mL of 10% dextrose today.  Will encourage sips of carb diet.  Goal of care: Multiple discussions about goals of care.  Continue current level of care including surgery as needed.  Appreciate palliative care involvement. Chose to be DNR with full scope of treatment.  DVT prophylaxis: SCDs Start: 08/24/20 2254   Code Status: DNR. Family Communication: [name] at the bedside.  Patient's 2 son on the phone 2/26. Disposition Plan: Status is: Inpatient  Remains inpatient appropriate because:Persistent severe electrolyte disturbances and IV treatments appropriate due to intensity of illness or inability to take PO   Dispo: The patient is from: Home              Anticipated d/c is to: SNF              Anticipated d/c date is: Unknown.              Patient currently is not medically stable to d/c.   Difficult to place patient No  Consultants:   Gastroenterology  Nephrology  General surgery  Procedures:   Routine dialysis  Antimicrobials:   Vancomycin by mouth 2/18---  Flagyl IV 2/26 --   Subjective: Patient seen and examined.  Overnight events noted.  Peripheral fingerstick hypoglycemia not correlating with serum level.  Patient himself today feels better.  Still has dull abdominal pain and rates about 5/10.  Denies any nausea or vomiting.  Had 2 bowel movements with some blood mixed on it overnight. Patient thinks he  can try some liquid.  Will start with sips of water.   Objective: Vitals:   09/02/20 0158 09/02/20 0228 09/02/20 0433 09/02/20 0750  BP: (!) 110/59 126/60 (!) 102/51 (!) 114/55  Pulse: (!) 52 (!) 36 (!) 106 100  Resp: 17 17 18 12   Temp:   98.4 F (36.9 C) 98.1 F (36.7 C)  TempSrc:   Oral Oral  SpO2: (!) 49% (!) 69%    Weight:   52.3 kg   Height:        Intake/Output Summary (Last 24 hours) at 09/02/2020 1108 Last data filed at 09/02/2020 1478 Gross per 24 hour  Intake 875 ml  Output 1011 ml  Net -136 ml   Filed Weights   08/30/20 0844 08/30/20 1152 09/02/20 0433  Weight: 56.7 kg 56.2 kg 52.3 kg    Examination: General: Frail and debilitated, sick looking gentleman lying in bed. Looks slightly comfortable today.  Wife at the bedside. Cardiovascular: S1-S2 normal.  Irregularly irregular.  No murmurs. Respiratory: Mostly clear.  Bilateral conducted airway sounds. Gastrointestinal: Soft and nontender.  No rigidity or guarding.   Ext: Thin and frail. Neuro: Equal power in all extremities.   Data Reviewed: I have personally reviewed following labs and imaging studies  CBC: Recent Labs  Lab 08/29/20 0258 08/30/20 0231 08/31/20 0203 08/31/20 2010 09/01/20 0144 09/01/20 0651 09/02/20 0343  WBC 27.3* 18.0*  11.8*  --  24.3* 23.3* 19.5*  NEUTROABS 24.9* 15.4* 9.6*  --  22.2*  --  18.1*  HGB 7.8* 7.0* 9.6* 8.7* 8.9* 9.7* 9.1*  HCT 23.1* 21.7* 30.3* 27.2* 28.4* 30.0* 28.3*  MCV 89.5 91.2 85.8  --  86.9 86.5 85.0  PLT 417* 414* 270  --  353 330 998   Basic Metabolic Panel: Recent Labs  Lab 08/28/20 0707 08/30/20 0927 09/01/20 0144 09/01/20 0651 09/01/20 2120 09/02/20 0343 09/02/20 0849  NA 133* 137 134* 132*  --  137  --   K 4.6 4.2 3.6 4.7  --  3.2*  --   CL 96* 94* 93* 94*  --  96*  --   CO2 23 24 24  18*  --  27  --   GLUCOSE 91 93 136* 108* 90 88 86  BUN 49* 48* 40* 44*  --  24*  --   CREATININE 7.81* 7.40* 6.64* 6.82*  --  4.30*  --   CALCIUM 8.0* 8.7*  8.7* 8.7*  --  8.4*  --   MG  --   --  2.1  --   --   --   --   PHOS 5.1* 6.3* 4.7* 5.1*  --   --   --    GFR: Estimated Creatinine Clearance: 11.3 mL/min (A) (by C-G formula based on SCr of 4.3 mg/dL (H)). Liver Function Tests: Recent Labs  Lab 08/28/20 0707 08/30/20 0927 09/01/20 0651 09/02/20 0343  AST  --   --   --  15  ALT  --   --   --  9  ALKPHOS  --   --   --  97  BILITOT  --   --   --  0.8  PROT  --   --   --  5.8*  ALBUMIN 2.4* 2.2* 2.2* 2.2*   No results for input(s): LIPASE, AMYLASE in the last 168 hours. No results for input(s): AMMONIA in the last 168 hours. Coagulation Profile: No results for input(s): INR, PROTIME in the last 168 hours. Cardiac Enzymes: No results for input(s): CKTOTAL, CKMB, CKMBINDEX, TROPONINI in the last 168 hours. BNP (last 3 results) No results for input(s): PROBNP in the last 8760 hours. HbA1C: No results for input(s): HGBA1C in the last 72 hours. CBG: Recent Labs  Lab 09/01/20 2029 09/02/20 0326 09/02/20 0429 09/02/20 0750 09/02/20 0755  GLUCAP 53* 39* 111* 18* 59*   Lipid Profile: No results for input(s): CHOL, HDL, LDLCALC, TRIG, CHOLHDL, LDLDIRECT in the last 72 hours. Thyroid Function Tests: No results for input(s): TSH, T4TOTAL, FREET4, T3FREE, THYROIDAB in the last 72 hours. Anemia Panel: No results for input(s): VITAMINB12, FOLATE, FERRITIN, TIBC, IRON, RETICCTPCT in the last 72 hours. Sepsis Labs: No results for input(s): PROCALCITON, LATICACIDVEN in the last 168 hours.  Recent Results (from the past 240 hour(s))  Ova and parasite examination     Status: None   Collection Time: 08/23/20  3:21 PM   Specimen: Stool  Result Value Ref Range Status   MICRO NUMBER: [id number]  Final   SPECIMEN QUALITY: Adequate  Final   Source STOOL  Final   STATUS: FINAL  Final   CONCENTRATE RESULT: No ova or parasites seen  Final   TRICHROME RESULT: No ova or parasites seen  Final   COMMENT:   Final    Routine Ova and Parasite  exam may not detect some parasites that occasionally cause diarrheal illness. Cryptosporidium Antigen and/or Cyclospora and Isospora  Exam may be ordered to detect these parasites. One negative sample does not necessarily rule out  the presence of a parasitic infection.  For additional information, please refer to https://education.questdiagnostics.com/faq/FAQ203 (This link is being provided for informational/ educational purposes only.)   Resp Panel by RT-PCR (Flu A&B, Covid) Nasopharyngeal Swab     Status: None   Collection Time: 08/24/20  3:40 PM   Specimen: Nasopharyngeal Swab; Nasopharyngeal(NP) swabs in vial transport medium  Result Value Ref Range Status   SARS Coronavirus 2 by RT PCR NEGATIVE NEGATIVE Final    Comment: (NOTE) SARS-CoV-2 target nucleic acids are NOT DETECTED.  The SARS-CoV-2 RNA is generally detectable in upper respiratory specimens during the acute phase of infection. The lowest concentration of SARS-CoV-2 viral copies this assay can detect is 138 copies/mL. A negative result does not preclude SARS-Cov-2 infection and should not be used as the sole basis for treatment or other patient management decisions. A negative result may occur with  improper specimen collection/handling, submission of specimen other than nasopharyngeal swab, presence of viral mutation(s) within the areas targeted by this assay, and inadequate number of viral copies(<138 copies/mL). A negative result must be combined with clinical observations, patient history, and epidemiological information. The expected result is Negative.  Fact Sheet for Patients:  EntrepreneurPulse.com.au  Fact Sheet for Healthcare Providers:  IncredibleEmployment.be  This test is no t yet approved or cleared by the [place] FDA and  has been authorized for detection and/or diagnosis of SARS-CoV-2 by FDA under an Emergency Use Authorization (EUA). This EUA will remain  in  effect (meaning this test can be used) for the duration of the COVID-19 declaration under Section 564(b)(1) of the Act, 21 U.S.C.section 360bbb-3(b)(1), unless the authorization is terminated  or revoked sooner.       Influenza A by PCR NEGATIVE NEGATIVE Final   Influenza B by PCR NEGATIVE NEGATIVE Final    Comment: (NOTE) The Xpert Xpress SARS-CoV-2/FLU/RSV plus assay is intended as an aid in the diagnosis of influenza from Nasopharyngeal swab specimens and should not be used as a sole basis for treatment. Nasal washings and aspirates are unacceptable for Xpert Xpress SARS-CoV-2/FLU/RSV testing.  Fact Sheet for Patients: EntrepreneurPulse.com.au  Fact Sheet for Healthcare Providers: IncredibleEmployment.be  This test is not yet approved or cleared by the [place] FDA and has been authorized for detection and/or diagnosis of SARS-CoV-2 by FDA under an Emergency Use Authorization (EUA). This EUA will remain in effect (meaning this test can be used) for the duration of the COVID-19 declaration under Section 564(b)(1) of the Act, 21 U.S.C. section 360bbb-3(b)(1), unless the authorization is terminated or revoked.  Performed at [facility], [place] [address], [place], [place] [address]   MRSA PCR Screening     Status: None   Collection Time: 08/26/20  8:05 AM   Specimen: Nasal Mucosa; Nasopharyngeal  Result Value Ref Range Status   MRSA by PCR NEGATIVE NEGATIVE Final    Comment:        The GeneXpert MRSA Assay (FDA approved for NASAL specimens only), is one component of a comprehensive MRSA colonization surveillance program. It is not intended to diagnose MRSA infection nor to guide or monitor treatment for MRSA infections. Performed at [facility], [place] [address] [place], [place] [address]          Radiology Studies: DG Abd 1 View  Result Date: 08/31/2020 CLINICAL DATA:  Nasogastric tube placement  EXAM: ABDOMEN - 1 VIEW COMPARISON:  June 08, 2018 FINDINGS: Nasogastric tube is not appreciable. There is gastric and small bowel dilatation with air. Colon does not appear appreciably dilated. No free air evident. IMPRESSION: 1.  Nasogastric tube not appreciable. 2. Gastric and small bowel dilatation. A degree of bowel obstruction must be of concern. 3.  No appreciable free air on supine examination. These results will be called to the ordering clinician or representative by the Radiologist Assistant, and communication documented in the PACS or [employer]. Electronically Signed   By: Lowella Grip III M.D.   On: 08/31/2020 16:21        Scheduled Meds: . sodium chloride   Intravenous Once  . Chlorhexidine Gluconate Cloth  6 each Topical Q0600  . Chlorhexidine Gluconate Cloth  6 each Topical Q0600  . Chlorhexidine Gluconate Cloth  6 each Topical Q0600  . Darbepoetin Alfa      . darbepoetin (ARANESP) injection - DIALYSIS  60 mcg Intravenous Q Sat-HD  . doxercalciferol      . doxercalciferol  7 mcg Intravenous Q T,Th,Sa-HD  . niacin  50 mg Oral TID WC  . pantoprazole (PROTONIX) IV  40 mg Intravenous Q12H  . saccharomyces boulardii  250 mg Oral BID  . vancomycin  500 mg Oral QID   Continuous Infusions: . sodium chloride    . sodium chloride    . dextrose 75 mL/hr at 09/01/20 1500  . metronidazole 500 mg (09/02/20 0414)     LOS: 9 days    Time spent: 30 minutes    Barb Merino, MD [facility] Pager [phone]

## 2020-09-03 ENCOUNTER — Encounter (HOSPITAL_COMMUNITY): Payer: Self-pay | Admitting: Gastroenterology

## 2020-09-03 ENCOUNTER — Inpatient Hospital Stay: Payer: Self-pay

## 2020-09-03 DIAGNOSIS — R140 Abdominal distension (gaseous): Secondary | ICD-10-CM

## 2020-09-03 DIAGNOSIS — K567 Ileus, unspecified: Secondary | ICD-10-CM | POA: Diagnosis not present

## 2020-09-03 DIAGNOSIS — K633 Ulcer of intestine: Secondary | ICD-10-CM | POA: Diagnosis not present

## 2020-09-03 DIAGNOSIS — I129 Hypertensive chronic kidney disease with stage 1 through stage 4 chronic kidney disease, or unspecified chronic kidney disease: Secondary | ICD-10-CM | POA: Diagnosis not present

## 2020-09-03 DIAGNOSIS — K2970 Gastritis, unspecified, without bleeding: Secondary | ICD-10-CM

## 2020-09-03 DIAGNOSIS — Z992 Dependence on renal dialysis: Secondary | ICD-10-CM | POA: Diagnosis not present

## 2020-09-03 DIAGNOSIS — K529 Noninfective gastroenteritis and colitis, unspecified: Secondary | ICD-10-CM

## 2020-09-03 DIAGNOSIS — Z66 Do not resuscitate: Secondary | ICD-10-CM | POA: Diagnosis not present

## 2020-09-03 DIAGNOSIS — A0472 Enterocolitis due to Clostridium difficile, not specified as recurrent: Secondary | ICD-10-CM | POA: Diagnosis not present

## 2020-09-03 DIAGNOSIS — K921 Melena: Secondary | ICD-10-CM | POA: Diagnosis not present

## 2020-09-03 DIAGNOSIS — K2990 Gastroduodenitis, unspecified, without bleeding: Secondary | ICD-10-CM

## 2020-09-03 DIAGNOSIS — N186 End stage renal disease: Secondary | ICD-10-CM | POA: Diagnosis not present

## 2020-09-03 DIAGNOSIS — E43 Unspecified severe protein-calorie malnutrition: Secondary | ICD-10-CM

## 2020-09-03 DIAGNOSIS — Z515 Encounter for palliative care: Secondary | ICD-10-CM | POA: Diagnosis not present

## 2020-09-03 DIAGNOSIS — D62 Acute posthemorrhagic anemia: Secondary | ICD-10-CM

## 2020-09-03 DIAGNOSIS — K559 Vascular disorder of intestine, unspecified: Secondary | ICD-10-CM | POA: Diagnosis not present

## 2020-09-03 LAB — COMPREHENSIVE METABOLIC PANEL
ALT: 9 U/L (ref 0–44)
AST: 12 U/L — ABNORMAL LOW (ref 15–41)
Albumin: 1.9 g/dL — ABNORMAL LOW (ref 3.5–5.0)
Alkaline Phosphatase: 94 U/L (ref 38–126)
Anion gap: 14 (ref 5–15)
BUN: 24 mg/dL — ABNORMAL HIGH (ref 8–23)
CO2: 26 mmol/L (ref 22–32)
Calcium: 8.7 mg/dL — ABNORMAL LOW (ref 8.9–10.3)
Chloride: 95 mmol/L — ABNORMAL LOW (ref 98–111)
Creatinine, Ser: 5.17 mg/dL — ABNORMAL HIGH (ref 0.61–1.24)
GFR, Estimated: 11 mL/min — ABNORMAL LOW (ref >60)
Glucose, Bld: 84 mg/dL (ref 70–99)
Potassium: 3.1 mmol/L — ABNORMAL LOW (ref 3.5–5.1)
Sodium: 135 mmol/L (ref 135–145)
Total Bilirubin: 0.6 mg/dL (ref 0.3–1.2)
Total Protein: 5.1 g/dL — ABNORMAL LOW (ref 6.5–8.1)

## 2020-09-03 LAB — CBC WITH DIFFERENTIAL/PLATELET
Abs Immature Granulocytes: 0.23 10*3/uL — ABNORMAL HIGH (ref 0.00–0.07)
Basophils Absolute: 0 10*3/uL (ref 0.0–0.1)
Basophils Relative: 0 %
Eosinophils Absolute: 0.1 10*3/uL (ref 0.0–0.5)
Eosinophils Relative: 1 %
HCT: 25.2 % — ABNORMAL LOW (ref 39.0–52.0)
Hemoglobin: 7.9 g/dL — ABNORMAL LOW (ref 13.0–17.0)
Immature Granulocytes: 1 %
Lymphocytes Relative: 3 %
Lymphs Abs: 0.5 10*3/uL — ABNORMAL LOW (ref 0.7–4.0)
MCH: 26.8 pg (ref 26.0–34.0)
MCHC: 31.3 g/dL (ref 30.0–36.0)
MCV: 85.4 fL (ref 80.0–100.0)
Monocytes Absolute: 1.6 10*3/uL — ABNORMAL HIGH (ref 0.1–1.0)
Monocytes Relative: 10 %
Neutro Abs: 14.4 10*3/uL — ABNORMAL HIGH (ref 1.7–7.7)
Neutrophils Relative %: 85 %
Platelets: 272 10*3/uL (ref 150–400)
RBC: 2.95 MIL/uL — ABNORMAL LOW (ref 4.22–5.81)
RDW: 21 % — ABNORMAL HIGH (ref 11.5–15.5)
WBC: 17 10*3/uL — ABNORMAL HIGH (ref 4.0–10.5)
nRBC: 0 % (ref 0.0–0.2)

## 2020-09-03 LAB — GLUCOSE, CAPILLARY
Glucose-Capillary: 103 mg/dL — ABNORMAL HIGH (ref 70–99)
Glucose-Capillary: 106 mg/dL — ABNORMAL HIGH (ref 70–99)
Glucose-Capillary: 108 mg/dL — ABNORMAL HIGH (ref 70–99)
Glucose-Capillary: 11 mg/dL — CL (ref 70–99)
Glucose-Capillary: 115 mg/dL — ABNORMAL HIGH (ref 70–99)
Glucose-Capillary: 120 mg/dL — ABNORMAL HIGH (ref 70–99)
Glucose-Capillary: 41 mg/dL — CL (ref 70–99)
Glucose-Capillary: 66 mg/dL — ABNORMAL LOW (ref 70–99)
Glucose-Capillary: 77 mg/dL (ref 70–99)
Glucose-Capillary: 94 mg/dL (ref 70–99)
Glucose-Capillary: 99 mg/dL (ref 70–99)

## 2020-09-03 MED ORDER — BOOST / RESOURCE BREEZE PO LIQD CUSTOM
1.0000 | Freq: Three times a day (TID) | ORAL | Status: DC
  Administered 2020-09-03 – 2020-09-04 (×2): 1 via ORAL

## 2020-09-03 MED ORDER — CHLORHEXIDINE GLUCONATE CLOTH 2 % EX PADS: 6.0000 | MEDICATED_PAD | Freq: Every day | CUTANEOUS | Status: DC

## 2020-09-03 MED FILL — Fentanyl Citrate Preservative Free (PF) Inj 100 MCG/2ML: INTRAMUSCULAR | Qty: 2 | Status: AC

## 2020-09-03 NOTE — Progress Notes (Signed)
Occupational Therapy Treatment Patient Details Name: Jason VANAKEN Sr. MRN: [id number] DOB: 11-03-1945 Today's Date: 09/03/2020    History of present illness Pt is a 75 y.o. male recently admitted 08/15/20-08/19/20 with upper GIB and duodenal ulcer, now readmitted 08/24/20 with [name]. difficile collitis, lower GIB. PMH includes afib on Coumadin, peptic ulcer disease, rectal CA, ESRD (HD TTS), CVA, gout, COPD, s/p partial lung lobectomy (2017).   OT comments  Pt limited by pain and recurrent BMs today. Pt minA for bed mobility and minA for transfers with RW; totalA for donning CAM walker boot. Pt maxA for toilet hygiene in standing x2 mins and requires rest breaks due to fatigue. Pt tolerating session fairly well. Pt VSS on RA. Pt would benefit from continued OT skilled services. OT following acutely.    Follow Up Recommendations  SNF    Equipment Recommendations  3 in 1 bedside commode    Recommendations for Other Services      Precautions / Restrictions Precautions Precautions: Fall  Precaution Comments: WBAT in RLE in CAM boot since last admission Required Braces or Orthoses: Other Brace Other Brace: CAM walker boot Restrictions Weight Bearing Restrictions: Yes  RLE Weight Bearing: Weight bearing as tolerated Other Position/Activity Restrictions: WBAT in CAM boot       Mobility Bed Mobility Overal bed mobility: Needs Assistance  Bed Mobility: Supine to Sit;Sit to Supine (     Supine to sit: Min assist Sit to supine: Min assist   General bed mobility comments: assist for side rail use.    Transfers Overall transfer level: Needs assistance Equipment used: Rolling walker (2 wheeled) Transfers: Sit to/from Stand Sit to Stand: Min assist         General transfer comment: MinA to side step to [facility] and to step 1' to [place] Overall balance assessment: Needs assistance Sitting-balance support: Feet supported;No upper extremity supported Sitting balance-Leahy Scale:  Fair     Standing balance support: Bilateral upper extremity supported;During functional activity Standing balance-Leahy Scale: Poor Standing balance comment: reliant on BUE support                           ADL either performed or assessed with clinical judgement   ADL Overall ADL's : Needs assistance/impaired     Grooming: Wash/dry hands;Wash/dry face;Oral care;Set up;Sitting Grooming Details (indicate cue type and reason): x5 mins at EOB             Lower Body Dressing: Total assistance Lower Body Dressing Details (indicate cue type and reason): assist for CAM waker boot Toilet Transfer: Minimal assistance;RW;BSC Toilet Transfer Details (indicate cue type and reason): with CAM walker boot Toileting- Clothing Manipulation and Hygiene: Maximal assistance;Cueing for safety;Cueing for sequencing;Sitting/lateral lean;Sit to/from stand Toileting - Clothing Manipulation Details (indicate cue type and reason): maxA with RW for stability; pt unable to let go of RW     Functional mobility during ADLs: Minimal assistance;Rolling walker;Cueing for safety General ADL Comments: Pt requiring cues for sequencing and for rest breaks. Pt fatigues easily and can stand for ~2 mins before requiring seated rest break. Pt currently requiring assist for LB ADL and set-upA in sitting for UB ADL. Unable to test further mobility due to cidff.     Vision   Vision Assessment?: No apparent visual deficits   Perception     Praxis      Cognition Arousal/Alertness: Lethargic;Awake/alert Behavior During Therapy: Flat affect Overall Cognitive Status: Impaired/Different  from baseline Area of Impairment: Safety/judgement;Problem solving;Awareness                         Safety/Judgement: Decreased awareness of deficits Awareness: Emergent Problem Solving: Slow processing;Decreased initiation;Requires verbal cues General Comments: Pt requiring increased time for arousal and slow  processing and decreased initiation at first; pt then more aware of deficits and asking for help from OTR        Exercises     Shoulder Instructions       General Comments VSS    Pertinent Vitals/ Pain       Pain Assessment: 0-10 Pain Score: 5  Faces Pain Scale: Hurts little more Pain Location: abdomen and bottom Pain Descriptors / Indicators: Guarding;Tender Pain Intervention(s): Monitored during session;Repositioned  Home Living                                          Prior Functioning/Environment              Frequency  Min 2X/week        Progress Toward Goals  OT Goals(current goals can now be found in the care plan section)  Progress towards OT goals: Progressing toward goals  Acute Rehab OT Goals Patient Stated Goal: to go to rehab before returning home OT Goal Formulation: With patient/family Time For Goal Achievement: 09/12/20 Potential to Achieve Goals: Good ADL Goals Pt Will Perform Grooming: with min guard assist;standing Pt Will Perform Upper Body Bathing: with supervision;with set-up;sitting Pt Will Perform Lower Body Bathing: with mod assist;sitting/lateral leans;sit to/from stand Pt Will Perform Upper Body Dressing: with supervision;with set-up;sitting Pt Will Transfer to Toilet: with min guard assist;with supervision;ambulating;regular height toilet;bedside commode Pt Will Perform Toileting - Clothing Manipulation and hygiene: with min assist;sit to/from stand  Plan Discharge plan remains appropriate    Co-evaluation                 AM-PAC OT "6 Clicks" Daily Activity     Outcome Measure   Help from another person eating meals?: None Help from another person taking care of personal grooming?: A Little Help from another person toileting, which includes using toliet, bedpan, or urinal?: A Lot Help from another person bathing (including washing, rinsing, drying)?: A Lot Help from another person to put on and  taking off regular upper body clothing?: A Little Help from another person to put on and taking off regular lower body clothing?: A Lot 6 Click Score: 16    End of Session Equipment Utilized During Treatment: Gait belt;Rolling walker  OT Visit Diagnosis: Unsteadiness on feet (R26.81);Other abnormalities of gait and mobility (R26.89);Muscle weakness (generalized) (M62.81);Pain Pain - part of body:  (bottomand abdomen)   Activity Tolerance Patient limited by fatigue   Patient Left in bed;with call bell/phone within reach;with bed alarm set   Nurse Communication Mobility status        Time: 1610-9604 OT Time Calculation (min): 35 min  Charges: OT General Charges $OT Visit: 1 Visit OT Treatments $Self Care/Home Management : 8-22 mins $Therapeutic Activity: 8-22 mins  Jason [name], OTR/L Acute Rehabilitation Services Pager: [phone] Office: [phone]    Jason [name] 09/03/2020, 5:04 PM

## 2020-09-03 NOTE — Progress Notes (Signed)
   Palliative Medicine Inpatient Follow Up Note  Reason for consult:  Goals of Care  HPI:  Per intake H&P --> 75 year old gentleman withextensive medical issues and comorbidities includinghistory of chronic A. fib on Coumadin, peptic ulcer disease, ESRD on hemodialysis TTS schedule, history of rectal cancer status post low anterior resection and reversal of ileostomywho presented to the ER with abdominal cramping, nausea vomiting diarrhea and rectal bleeding. Patient was recently in the hospital 2/9-2/13 with upper GI bleeding and found to have bleeding duodenal ulcer treated with epinephrine injection and clipping. Discharged on Protonix twice daily. Patient also has chronic diarrhea. Admitted with rectal bleeding, C. difficile infection and diarrhea and anemia. Patient was hemodynamically stable in the ER.Admitted with C. diff, rectal bleeding. Followed by GI.  Palliative care was consulted in the setting of difficult to treat conditions to discuss goals of care.  Today's Discussion (09/03/2020):  *Please note that this is a verbal dictation therefore any spelling or grammatical errors are due to the "[employer] [employer]" system interpretation.  I met with Jason [name] this morning though he was noted to be resting. His wife Jason [name] was present at bedside.   Jason [name] shares that Jason [name] has been resting the morning and has not been in any discomfort. We reviewed his present state which appears to be stable.   Jason [name] asked about his discharge plan and if their insurance would pay for rehabilitation. I told her that I would escalate these questions to our transitions of care team.  Question and concerns answered  Objective Assessment: Vital Signs Vitals:   09/03/20 0756 09/03/20 1100  BP: 104/73 110/64  Pulse: 98 96  Resp: 18 17  Temp: 97.6 F (36.4 C) 97.7 F (36.5 C)  SpO2: 98% 99%    Intake/Output Summary (Last 24 hours) at 09/03/2020 1255 Last data filed at 09/03/2020 0300 Gross per  24 hour  Intake 300 ml  Output 0 ml  Net 300 ml   Last Weight  Most recent update: 09/03/2020  6:49 AM   Weight  52.3 kg (115 lb 4.8 oz)           Gen:  Older AA male, chronically ill appearing HEENT: Dry mucous membranes CV: Irregular rate and rhythm  PULM:  2LPM [place] ABD: soft/nontender  EXT: (+) muscle wasting Neuro: Somnolent  SUMMARY OF RECOMMENDATIONS   DNAR/DNI  MOST Completed, paper copy placed onto the chart electric copy can be found in [place]  DNR Form Completed, paper copy placed onto the chart electric copy can be found in [facility]  TOC - OP Palliative Support, ideally through [place] will be liberalized tomorrow hospital wide which will allow for patients sons to visit  Appreciate TOC team speaking with patients wife regarding placement options covered by her insurance  PMT will continue tin incrementally follow along, please call us if our services are needed sooner  Time Spent: 15 Greater than 50% of the time was spent in counseling and coordination of care ______________________________________________________________________________________ Jason [name] Team Team Cell Phone: [phone] Please utilize secure chat with additional questions, if there is no response within 30 minutes please call the above phone number  Palliative Medicine Team providers are available by phone from 7am to 7pm daily and can be reached through the team cell phone.  Should this patient require assistance outside of these hours, please call the patient's attending physician.

## 2020-09-03 NOTE — Progress Notes (Signed)
PROGRESS NOTE    Jason BOHR Sr.  EXH:[id number] DOB: Mar 21, 1946 DOA: 08/24/2020 PCP: [name], MD    Brief Narrative:  Jason Cotta Sr. is a 76 year old gentleman with past medical history significant for chronic atrial fibrillation on Coumadin, peptic ulcer disease, ESRD on HD TTS, history of rectal cancer status post low anterior resection and reversal of ileostomy with chemotherapy/radiation who presented to the ED with abdominal pain/cramping, nausea/vomiting/diarrhea and rectal bleeding. Patient with follow-up with his GI physician due to recent hospitalization from GI bleed due to duodenal ulcer, patient had repeat labs performed which was notably positive for C. difficile colitis. Patient was started on medication for his C. difficile infection outpatient, but he noticed a large amount of blood in his stool which was concerning which prompted further evaluation in the ED.  In the ED, patient was found to have an elevated INR, he was given oral vitamin K and started on Protonix. [facility] was consulted. Hospital service consulted for further evaluation and management of GI bleed with supratherapeutic INR in the setting of C. difficile colitis.   Assessment & Plan:   Active Problems:   Hematochezia   GIB (gastrointestinal bleeding)   C. difficile colitis   Generalized abdominal pain   Non-intractable vomiting   Abdominal distension (gaseous)   Gastritis and gastroduodenitis   Noninfectious gastroenteritis   Colon ulcer   Intestinal occlusion (HCC)   C. difficile colitis CT angiogram abdomen/pelvis 2/24 suspicious for ileus, no obstruction. Flexible sigmoidoscopy 09/01/2020 with severely ulcerated mucosa at the sigmoid colon/rectum, possibly radiation changes versus ischemia versus infection. --WBC 17.4>30.935.7>29.0>23.3>19.5>17.0 --Vancomycin 500 mg PO QID --Metronidazole 500 mg IV q12h --Florastor 250 mg p.o. twice daily --CBC daily --Continue to monitor bowel  movements closely  Acute on chronic GI bleeding Acute blood loss anemia, POA Supratherapeutic INR, POA Patient reports large bloody bowel movement prior to ED presentation. INR notably elevated at 8.8 on admission. Received oral vitamin K for reversal. Recent hospitalization with finding of duodenal ulcer on EGD. Discharged on Protonix twice daily. EGD on 09/01/2020 with no evidence of active bleeding. Colonoscopy 2/26 with findings consistent with severe radiation changes, proctitis, ischemic vs inflammatory colitis, strictures. --[facility] following, appreciate assistance. --Hgb 8.9>6.8>>8.6>7.0>9.6>8.9>9.7>9.1>7.9 --s/p 2u pRBC 2/19 and 1 u pRBC 2/24 --Aranesp 81mcg every Sat w/ HD --Continue to monitor hemoglobin daily, transfuse for Hgb <7.0 or active bleeding --CBC in am  Nausea/vomiting Ileus Hx rectal cancer s/p LAR, XRT, chemotherapy No gastric outlet obstruction noted on endoscopy. Colonoscopy notable for anal stenosis with rectal stenosis and ulceration from previous surgery/radiation. CT angiogram abdomen/pelvis with occlusion IMA but patent celiac and SMA with collateral filling. --General surgery following, appreciate assistance --If patient develops peritoneal signs or clinical deterioration, would require colectomy surgery  Chronic atrial fibrillation on Coumadin Supratherapeutic INR INR 8.8 admission, reversed with oral vitamin K. --Continue to hold Coumadin --Continue to monitor on telemetry   ESRD on HD TTS --Nephrology following, appreciate assistance --Continue HD inpatient per nephrology  Suspected B3 deficiency Niacin level 30. Continue niacin replacement  Hypoglycemic episodes Etiology likely secondary to prolonged n.p.o. status. --D10 at 67mL/hr --continue to monitor glucose closely  Adult failure to thrive Severe protein calorie malnutrition Body mass index is 17.53 kg/m. Oral intake limited to ability to tolerate given nausea/vomiting and C.  difficile colitis with possible ileus. Once cleared to advance diet, will consider nutrition evaluation for supplementation recommendations.  Goals of care: --Palliative care following, appreciate assistance    DVT  prophylaxis: SCDs   Code Status: DNR Family Communication: Updated patient spouse was present at bedside  Disposition Plan:  Level of care: Progressive Status is: Inpatient  Remains inpatient appropriate because:Ongoing diagnostic testing needed not appropriate for outpatient work up, Unsafe d/c plan, IV treatments appropriate due to intensity of illness or inability to take PO and Inpatient level of care appropriate due to severity of illness   Dispo:  Patient From: Home  Planned Disposition: [place]  Medically stable for discharge: No     Consultants:   [facility]  Nephrology  Palliative care   Procedures:   EGD  Colonoscopy  Antimicrobials:   Oral vancomycin  Metronidazole   Subjective: Patient seen and examined at bedside, resting comfortably. Spouse present. Requesting something to eat and drink. Abdominal pain improved. No further nausea/vomiting. Continues to be very weak and fatigued. Seen by palliative care yesterday and CODE STATUS changed to DNR. No other questions or concerns at this time. Denies headache, no chest pain, no palpitations, no shortness of breath, no current abdominal pain. No acute events overnight per nursing staff.  Objective: Vitals:   09/03/20 0257 09/03/20 0647 09/03/20 0756 09/03/20 1100  BP: 122/69  104/73 110/64  Pulse: 85  98 96  Resp: 16  18 17   Temp: 97.7 F (36.5 C)  97.6 F (36.4 C) 97.7 F (36.5 C)  TempSrc: Oral  Oral Oral  SpO2:   98% 99%  Weight:  52.3 kg    Height:        Intake/Output Summary (Last 24 hours) at 09/03/2020 1433 Last data filed at 09/03/2020 0300 Gross per 24 hour  Intake 300 ml  Output 0 ml  Net 300 ml   Filed Weights   08/30/20 1152  09/02/20 0433 09/03/20 0647  Weight: 56.2 kg 52.3 kg 52.3 kg    Examination:  General exam: Appears calm and comfortable, chronically ill/cachectic/frail in appearance Respiratory system: Clear to auscultation. Respiratory effort normal. Oxygenating well on room air Cardiovascular system: S1 & S2 heard, RRR. No JVD, murmurs, rubs, gallops or clicks. No pedal edema. Gastrointestinal system: Abdomen is nondistended, soft and nontender. No organomegaly or masses felt. Normal bowel sounds heard. Central nervous system: Alert and oriented. No focal neurological deficits. Extremities: Symmetric 5 x 5 power. Skin: No rashes, lesions or ulcers Psychiatry: Judgement and insight appear poor. Mood & affect appropriate.     Data Reviewed: I have personally reviewed following labs and imaging studies  CBC: Recent Labs  Lab 08/30/20 0231 08/31/20 0203 08/31/20 2010 09/01/20 0144 09/01/20 0651 09/02/20 0343 09/03/20 0053  WBC 18.0* 11.8*  --  24.3* 23.3* 19.5* 17.0*  NEUTROABS 15.4* 9.6*  --  22.2*  --  18.1* 14.4*  HGB 7.0* 9.6* 8.7* 8.9* 9.7* 9.1* 7.9*  HCT 21.7* 30.3* 27.2* 28.4* 30.0* 28.3* 25.2*  MCV 91.2 85.8  --  86.9 86.5 85.0 85.4  PLT 414* 270  --  353 330 318 945   Basic Metabolic Panel: Recent Labs  Lab 08/28/20 0707 08/30/20 0927 09/01/20 0144 09/01/20 0651 09/01/20 2120 09/02/20 0343 09/02/20 0849 09/03/20 0053  NA 133* 137 134* 132*  --  137  --  135  K 4.6 4.2 3.6 4.7  --  3.2*  --  3.1*  CL 96* 94* 93* 94*  --  96*  --  95*  CO2 23 24 24  18*  --  27  --  26  GLUCOSE 91 93 136* 108* 90 88  86 84  BUN 49* 48* 40* 44*  --  24*  --  24*  CREATININE 7.81* 7.40* 6.64* 6.82*  --  4.30*  --  5.17*  CALCIUM 8.0* 8.7* 8.7* 8.7*  --  8.4*  --  8.7*  MG  --   --  2.1  --   --   --   --   --   PHOS 5.1* 6.3* 4.7* 5.1*  --   --   --   --    GFR: Estimated Creatinine Clearance: 9.4 mL/min (A) (by C-G formula based on SCr of 5.17 mg/dL (H)). Liver Function Tests: Recent  Labs  Lab 08/28/20 0707 08/30/20 0927 09/01/20 0651 09/02/20 0343 09/03/20 0053  AST  --   --   --  15 12*  ALT  --   --   --  9 9  ALKPHOS  --   --   --  97 94  BILITOT  --   --   --  0.8 0.6  PROT  --   --   --  5.8* 5.1*  ALBUMIN 2.4* 2.2* 2.2* 2.2* 1.9*   No results for input(s): LIPASE, AMYLASE in the last 168 hours. No results for input(s): AMMONIA in the last 168 hours. Coagulation Profile: No results for input(s): INR, PROTIME in the last 168 hours. Cardiac Enzymes: No results for input(s): CKTOTAL, CKMB, CKMBINDEX, TROPONINI in the last 168 hours. BNP (last 3 results) No results for input(s): PROBNP in the last 8760 hours. HbA1C: No results for input(s): HGBA1C in the last 72 hours. CBG: Recent Labs  Lab 09/02/20 2028 09/02/20 2322 09/03/20 0301 09/03/20 0755 09/03/20 1156  GLUCAP 95 85 108* 94 103*   Lipid Profile: No results for input(s): CHOL, HDL, LDLCALC, TRIG, CHOLHDL, LDLDIRECT in the last 72 hours. Thyroid Function Tests: No results for input(s): TSH, T4TOTAL, FREET4, T3FREE, THYROIDAB in the last 72 hours. Anemia Panel: No results for input(s): VITAMINB12, FOLATE, FERRITIN, TIBC, IRON, RETICCTPCT in the last 72 hours. Sepsis Labs: No results for input(s): PROCALCITON, LATICACIDVEN in the last 168 hours.  Recent Results (from the past 240 hour(s))  Resp Panel by RT-PCR (Flu A&B, Covid) Nasopharyngeal Swab     Status: None   Collection Time: 08/24/20  3:40 PM   Specimen: Nasopharyngeal Swab; Nasopharyngeal(NP) swabs in vial transport medium  Result Value Ref Range Status   SARS Coronavirus 2 by RT PCR NEGATIVE NEGATIVE Final    Comment: (NOTE) SARS-CoV-2 target nucleic acids are NOT DETECTED.  The SARS-CoV-2 RNA is generally detectable in upper respiratory specimens during the acute phase of infection. The lowest concentration of SARS-CoV-2 viral copies this assay can detect is 138 copies/mL. A negative result does not preclude  SARS-Cov-2 infection and should not be used as the sole basis for treatment or other patient management decisions. A negative result may occur with  improper specimen collection/handling, submission of specimen other than nasopharyngeal swab, presence of viral mutation(s) within the areas targeted by this assay, and inadequate number of viral copies(<138 copies/mL). A negative result must be combined with clinical observations, patient history, and epidemiological information. The expected result is Negative.  Fact Sheet for Patients:  EntrepreneurPulse.com.au  Fact Sheet for Healthcare Providers:  IncredibleEmployment.be  This test is no t yet approved or cleared by the [place] FDA and  has been authorized for detection and/or diagnosis of SARS-CoV-2 by FDA under an Emergency Use Authorization (EUA). This EUA will remain  in effect (meaning this test  can be used) for the duration of the COVID-19 declaration under Section 564(b)(1) of the Act, 21 U.S.C.section 360bbb-3(b)(1), unless the authorization is terminated  or revoked sooner.       Influenza A by PCR NEGATIVE NEGATIVE Final   Influenza B by PCR NEGATIVE NEGATIVE Final    Comment: (NOTE) The Xpert Xpress SARS-CoV-2/FLU/RSV plus assay is intended as an aid in the diagnosis of influenza from Nasopharyngeal swab specimens and should not be used as a sole basis for treatment. Nasal washings and aspirates are unacceptable for Xpert Xpress SARS-CoV-2/FLU/RSV testing.  Fact Sheet for Patients: EntrepreneurPulse.com.au  Fact Sheet for Healthcare Providers: IncredibleEmployment.be  This test is not yet approved or cleared by the [place] FDA and has been authorized for detection and/or diagnosis of SARS-CoV-2 by FDA under an Emergency Use Authorization (EUA). This EUA will remain in effect (meaning this test can be used) for the duration of  the COVID-19 declaration under Section 564(b)(1) of the Act, 21 U.S.C. section 360bbb-3(b)(1), unless the authorization is terminated or revoked.  Performed at [facility], [place] [address] [place], [place] [address]   MRSA PCR Screening     Status: None   Collection Time: 08/26/20  8:05 AM   Specimen: Nasal Mucosa; Nasopharyngeal  Result Value Ref Range Status   MRSA by PCR NEGATIVE NEGATIVE Final    Comment:        The GeneXpert MRSA Assay (FDA approved for NASAL specimens only), is one component of a comprehensive MRSA colonization surveillance program. It is not intended to diagnose MRSA infection nor to guide or monitor treatment for MRSA infections. Performed at [facility], [place] [address], [place], [place] [address]          Radiology Studies: No results found.      Scheduled Meds: . sodium chloride   Intravenous Once  . Chlorhexidine Gluconate Cloth  6 each Topical Q0600  . Chlorhexidine Gluconate Cloth  6 each Topical Q0600  . Chlorhexidine Gluconate Cloth  6 each Topical Q0600  . darbepoetin (ARANESP) injection - DIALYSIS  60 mcg Intravenous Q Sat-HD  . doxercalciferol  7 mcg Intravenous Q T,Th,Sa-HD  . niacin  50 mg Oral TID WC  . pantoprazole (PROTONIX) IV  40 mg Intravenous Q12H  . saccharomyces boulardii  250 mg Oral BID  . vancomycin  500 mg Oral QID   Continuous Infusions: . sodium chloride    . sodium chloride    . dextrose 75 mL/hr at 09/01/20 1500  . metronidazole 500 mg (09/03/20 0309)     LOS: 10 days    Time spent: 39 minutes spent on chart review, discussion with nursing staff, consultants, updating family and interview/physical exam; more than 50% of that time was spent in counseling and/or coordination of care.    Eric J [place] DO [facility] Available via Epic secure chat 7am-7pm After these hours, please refer to coverage provider listed on [email or web address] 09/03/2020, 2:33 PM

## 2020-09-03 NOTE — Progress Notes (Signed)
2 Days Post-Op  Subjective: Patient continues to have some abdominal pain but WBC is downtrending. Remains hemodynamically stable. Patient met with palliative yesterday and is now DNR however he would like to pursue surgery if this becomes necessary.   Objective: Vital signs in last 24 hours: Temp:  [97.6 F (36.4 C)-98.4 F (36.9 C)] 97.6 F (36.4 C) (02/28 0756) Pulse Rate:  [72-98] 98 (02/28 0756) Resp:  [11-20] 18 (02/28 0756) BP: (104-127)/(46-73) 104/73 (02/28 0756) SpO2:  [97 %-98 %] 98 % (02/28 0756) Weight:  [52.3 kg] 52.3 kg (02/28 0647) Last BM Date: 09/02/20  Intake/Output from previous day: 02/27 0701 - 02/28 0700 In: 300 [IV Piggyback:300] Out: 0  Intake/Output this shift: No intake/output data recorded.  PE: General: resting comfortably, NAD Neuro: alert and oriented Resp: normal work of breathing Abdomen: mildly tender to palpation but soft and non-rigid  Lab Results:  Recent Labs    09/02/20 0343 09/03/20 0053  WBC 19.5* 17.0*  HGB 9.1* 7.9*  HCT 28.3* 25.2*  PLT 318 272   BMET Recent Labs    09/02/20 0343 09/02/20 0849 09/03/20 0053  NA 137  --  135  K 3.2*  --  3.1*  CL 96*  --  95*  CO2 27  --  26  GLUCOSE 88 86 84  BUN 24*  --  24*  CREATININE 4.30*  --  5.17*  CALCIUM 8.4*  --  8.7*   PT/INR No results for input(s): LABPROT, INR in the last 72 hours. CMP     Component Value Date/Time   NA 135 09/03/2020 0053   NA 140 02/24/2017 0000   NA 138 02/20/2014 1432   K 3.1 (L) 09/03/2020 0053   K 3.5 02/20/2014 1432   CL 95 (L) 09/03/2020 0053   CL 101 12/09/2012 1048   CO2 26 09/03/2020 0053   CO2 30 (H) 02/20/2014 1432   GLUCOSE 84 09/03/2020 0053   GLUCOSE 93 02/20/2014 1432   GLUCOSE 96 12/09/2012 1048   BUN 24 (H) 09/03/2020 0053   BUN 50 (A) 08/10/2017 0000   BUN 36.0 (H) 02/20/2014 1432   CREATININE 5.17 (H) 09/03/2020 0053   CREATININE 2.4 (H) 02/20/2014 1432   CALCIUM 8.7 (L) 09/03/2020 0053   CALCIUM 10.4  02/20/2014 1432   PROT 5.1 (L) 09/03/2020 0053   PROT 7.2 02/20/2014 1432   ALBUMIN 1.9 (L) 09/03/2020 0053   ALBUMIN 3.8 02/20/2014 1432   AST 12 (L) 09/03/2020 0053   AST 22 02/20/2014 1432   ALT 9 09/03/2020 0053   ALT 12 02/20/2014 1432   ALKPHOS 94 09/03/2020 0053   ALKPHOS 86 02/20/2014 1432   BILITOT 0.6 09/03/2020 0053   BILITOT 0.32 02/20/2014 1432   GFRNONAA 11 (L) 09/03/2020 0053   GFRAA 8 (L) 04/08/2019 1702   Lipase     Component Value Date/Time   LIPASE 47 08/15/2020 0659       Studies/Results: No results found.  Anti-infectives: Anti-infectives (From admission, onward)   Start     Dose/Rate Route Frequency Ordered Stop   09/01/20 1600  metroNIDAZOLE (FLAGYL) IVPB 500 mg        500 mg 100 mL/hr over 60 Minutes Intravenous Every 12 hours 09/01/20 1315     08/26/20 1000  vancomycin (VANCOCIN) 50 mg/mL oral solution 500 mg        500 mg Oral 4 times daily 08/26/20 0740 09/03/20 2159   08/24/20 2100  vancomycin (VANCOCIN) 50 mg/mL oral  solution 125 mg  Status:  Discontinued        125 mg Oral 4 times daily 08/24/20 1717 08/26/20 0740       Assessment/Plan 75 yo male with history of LAR for colon cancer s/p chemoradiation, now with C diff colitis. He is clinically stable with no peritonitis on exam, WBC continues to downtrend. - Continue antibiotics - Do not advance diet - Serial abdominal exams. - If patient develops peritoneal signs or clinical deterioration he will require colectomy. Surgery will continue to follow closely.    LOS: 10 days    Michaelle Birks, MD [facility] Surgery General, Hepatobiliary and Pancreatic Surgery 09/03/20 10:51 AM

## 2020-09-03 NOTE — Progress Notes (Signed)
[facility] Progress Note   Subjective:   Patient seen in room, tired. No new c/o- still with belly pain. Wife at bedside.   Objective Vitals:   09/02/20 2326 09/03/20 0257 09/03/20 0647 09/03/20 0756  BP: (!) 118/53 122/69  104/73  Pulse: 72 85  98  Resp: 20 16  18   Temp: 97.7 F (36.5 C) 97.7 F (36.5 C)  97.6 F (36.4 C)  TempSrc: Oral Oral  Oral  SpO2:    98%  Weight:   52.3 kg   Height:       Physical Exam General: elderly, ill appearing male, very tired Heart:+tachycardia, RR, no mrg Lungs:mostly CTAB, breath sounds decreased bilaterally Abdomen: soft, mildly tender throughout Extremities:no LE edema Dialysis Access: LU AVF +bruit  OP HD: [facility]  4h  450/500  58kg  3K/2Ca   P2    L AVF    Heparin none  Mircera 50 (to start 2/22) Venofer 50  Hectorol 7   Assessment/Plan: 1. C. Diff colitis - on po Vanc/Florastor w/ persistent abd pain and diarrhea.CTA abd/pelvis 2/24 suspicious for ileus, no obstruction. NG placed.  Flex sig 2/26 showed severely ulcerated mucosa of the sigmoid colon/ rectum, possibly radiation changes vs ischemia vs infection. Per primary/GI.  2. GI bleed/ ABL anemia:  w/ supra therapeutic INR on admit (during previous admissionEGD2/9with duodenal ulcer, treated w/ clip+ injection). On Protonix. S/p 2u prbcs 2/19. Hgb improved to 9's s/p 1unit pRBC 2/24. Significant dec in hgb today-  On low dose darbe-  Will look to inc dose for next Saturday  3. ESRD - HD TTS. Next HD 3/01. No heparin via AVF 4. AFib- on coumadin. Supratherapeutic INR on admission.Reversed with Vit K. Coumadin beingheld. 5. HTN/Volume - BP's normal today.Under dry wt, looks a bit dry. Minimal UF with HD. 6. Anemia CKD --see #1. Hgb9's, has had 3u prbc's here total. Continue aranesp 60qwk q Sat (rec'd on 2/20 and 2/27 here) 7. MBD CKD --Corr Caok.  Continue home binders when able to tolerate PO. Hold Sensipar for now. Cont home hectorol dosing with  HD 8. COPD  9. H/o prior rectal cancer - in 2014 treated w/ chemoRx,radiation, laparoscopic low anterior resection with diverting loop ileostomy and subsequent ileostomy takedown. 10. GOC - seen by palliative care, pt is now DNR    Louis Meckel  09/01/2020, 10:01 AM   Filed Weights   08/30/20 1152 09/02/20 0433 09/03/20 0647  Weight: 56.2 kg 52.3 kg 52.3 kg    Intake/Output Summary (Last 24 hours) at 09/03/2020 0906 Last data filed at 09/03/2020 0300 Gross per 24 hour  Intake 300 ml  Output 0 ml  Net 300 ml    Additional Objective Labs: Basic Metabolic Panel: Recent Labs  Lab 08/30/20 0927 09/01/20 0144 09/01/20 0651 09/01/20 2120 09/02/20 0343 09/02/20 0849 09/03/20 0053  NA 137 134* 132*  --  137  --  135  K 4.2 3.6 4.7  --  3.2*  --  3.1*  CL 94* 93* 94*  --  96*  --  95*  CO2 24 24 18*  --  27  --  26  GLUCOSE 93 136* 108*   < > 88 86 84  BUN 48* 40* 44*  --  24*  --  24*  CREATININE 7.40* 6.64* 6.82*  --  4.30*  --  5.17*  CALCIUM 8.7* 8.7* 8.7*  --  8.4*  --  8.7*  PHOS 6.3* 4.7* 5.1*  --   --   --   --    < > =  values in this interval not displayed.   Liver Function Tests: Recent Labs  Lab 09/01/20 0651 09/02/20 0343 09/03/20 0053  AST  --  15 12*  ALT  --  9 9  ALKPHOS  --  97 94  BILITOT  --  0.8 0.6  PROT  --  5.8* 5.1*  ALBUMIN 2.2* 2.2* 1.9*   CBC: Recent Labs  Lab 08/31/20 0203 08/31/20 2010 09/01/20 0144 09/01/20 0651 09/02/20 0343 09/03/20 0053  WBC 11.8*  --  24.3* 23.3* 19.5* 17.0*  NEUTROABS 9.6*  --  22.2*  --  18.1* 14.4*  HGB 9.6*   < > 8.9* 9.7* 9.1* 7.9*  HCT 30.3*   < > 28.4* 30.0* 28.3* 25.2*  MCV 85.8  --  86.9 86.5 85.0 85.4  PLT 270  --  353 330 318 272   < > = values in this interval not displayed.   Blood Culture    Component Value Date/Time   SDES IN/OUT CATH URINE 08/17/2020 1234   SPECREQUEST  08/17/2020 1234    NONE Performed at [facility] [address], [place], [place] [address]     CULT 4,000 COLONIES/mL ENTEROCOCCUS GALLINARUM (A) 08/17/2020 1234   REPTSTATUS 08/21/2020 FINAL 08/17/2020 1234   CBG: Recent Labs  Lab 09/02/20 1647 09/02/20 2028 09/02/20 2322 09/03/20 0301 09/03/20 0755  GLUCAP 114* 95 85 108* 94   Medications: . sodium chloride    . sodium chloride    . dextrose 75 mL/hr at 09/01/20 1500  . metronidazole 500 mg (09/03/20 0309)   . sodium chloride   Intravenous Once  . Chlorhexidine Gluconate Cloth  6 each Topical Q0600  . Chlorhexidine Gluconate Cloth  6 each Topical Q0600  . Chlorhexidine Gluconate Cloth  6 each Topical Q0600  . darbepoetin (ARANESP) injection - DIALYSIS  60 mcg Intravenous Q Sat-HD  . doxercalciferol  7 mcg Intravenous Q T,Th,Sa-HD  . niacin  50 mg Oral TID WC  . pantoprazole (PROTONIX) IV  40 mg Intravenous Q12H  . saccharomyces boulardii  250 mg Oral BID  . vancomycin  500 mg Oral QID

## 2020-09-03 NOTE — Progress Notes (Signed)
Physical Therapy Treatment Patient Details Name: Jason STILLINGER Sr. MRN: [id number] DOB: Nov 12, 1945 Today's Date: 09/03/2020    History of Present Illness Pt is a 75 y.o. male recently admitted 08/15/20-08/19/20 with upper GIB and duodenal ulcer, now readmitted 08/24/20 with C. difficile collitis, lower GIB. PMH includes afib on Coumadin, peptic ulcer disease, rectal CA, ESRD (HD TTS), CVA, gout, COPD, s/p partial lung lobectomy (2017).    PT Comments    Pt asleep on entry but roused easily and agreeable to therapy, however reports being very fatigued. Pt is min A for coming to [facility], once there found to be incontinent of stool. Pt agreeable to get to [facility] and requires min A for coming to upright and stepping over to [facility]. Pt spent approximately 8 min on BSC with small stool. Pt min A for returning to bed. Physician in room at end of session. Requested RN order air mattress to reduce risk for pressure injury. RN reports it has already been ordered. D/c plans remain appropriate at this time. PT will continue to follow acutely.   Follow Up Recommendations  SNF     Equipment Recommendations  3in1 (PT)       Precautions / Restrictions Precautions Precautions: Fall Precaution Comments: WBAT in RLE in CAM boot since last admission Required Braces or Orthoses: Other Brace Other Brace: CAM walker boot Restrictions Weight Bearing Restrictions: Yes RLE Weight Bearing: Weight bearing as tolerated Other Position/Activity Restrictions: WBAT in CAM boot    Mobility  Bed Mobility Overal bed mobility: Needs Assistance Bed Mobility: Supine to Sit;Sit to Supine;Rolling Rolling: Min guard   Supine to sit: Min assist;HOB elevated Sit to supine: Min assist   General bed mobility comments: min A for management of trunk to upright and for management of LE back into bed, pt requires increased cuing and time for completion, with return to bed pt request cream on his bottom and is able to pull over on his side  with min guared    Transfers Overall transfer level: Needs assistance Equipment used: Rolling walker (2 wheeled) Transfers: Sit to/from Stand Sit to Stand: Min assist         General transfer comment: MinA to side step to [facility] and to step 1' to [facility]  Ambulation/Gait Ambulation/Gait assistance: Min guard Gait Distance (Feet): 3 Feet Assistive device: Rolling walker (2 wheeled) Gait Pattern/deviations: Step-to pattern;Decreased stride length;Trunk flexed Gait velocity: decreased Gait velocity interpretation: <1.31 ft/sec, indicative of household ambulator General Gait Details: min guard for safet with stepping to/from  BSC on L, ,mild instability no LoB         Balance Overall balance assessment: Needs assistance Sitting-balance support: Feet supported;No upper extremity supported Sitting balance-Leahy Scale: Fair     Standing balance support: Bilateral upper extremity supported;During functional activity Standing balance-Leahy Scale: Poor Standing balance comment: reliant on BUE support                            Cognition Arousal/Alertness: Lethargic;Awake/alert Behavior During Therapy: Flat affect Overall Cognitive Status: Impaired/Different from baseline Area of Impairment: Safety/judgement;Problem solving;Awareness                         Safety/Judgement: Decreased awareness of deficits Awareness: Emergent Problem Solving: Slow processing;Decreased initiation;Requires verbal cues General Comments: requires increased time and cuing for mobility         General Comments General comments (skin integrity, edema, etc.): VSS  Pertinent Vitals/Pain Pain Assessment: Faces Pain Score: 5  Faces Pain Scale: Hurts little more Pain Location: abdomen and bottom Pain Descriptors / Indicators: Guarding;Tender Pain Intervention(s): Limited activity within patient's tolerance;Monitored during session;Repositioned           PT Goals (current  goals can now be found in the care plan section) Acute Rehab PT Goals Patient Stated Goal: to go to rehab before returning home PT Goal Formulation: With patient Time For Goal Achievement: 09/12/20 Potential to Achieve Goals: Good Progress towards PT goals: Progressing toward goals    Frequency    Min 2X/week      PT Plan Current plan remains appropriate       AM-PAC PT "6 Clicks" Mobility   Outcome Measure  Help needed turning from your back to your side while in a flat bed without using bedrails?: A Little Help needed moving from lying on your back to sitting on the side of a flat bed without using bedrails?: A Little Help needed moving to and from a bed to a chair (including a wheelchair)?: A Little Help needed standing up from a chair using your arms (e.g., wheelchair or bedside chair)?: A Little Help needed to walk in hospital room?: A Little Help needed climbing 3-5 steps with a railing? : A Lot 6 Click Score: 17    End of Session Equipment Utilized During Treatment: Gait belt Activity Tolerance: Patient tolerated treatment well Patient left: in bed;with call bell/phone within reach;with bed alarm set Nurse Communication: Mobility status;Other (comment) (request air bed be ordered since pt with decreased mobility) PT Visit Diagnosis: Unsteadiness on feet (R26.81);Difficulty in walking, not elsewhere classified (R26.2);Muscle weakness (generalized) (M62.81) Pain - Right/Left: Right Pain - part of body: Leg     Time: 8177-1165 PT Time Calculation (min) (ACUTE ONLY): 31 min  Charges:  $Therapeutic Activity: 23-37 mins                     Fady Stamps B. Migdalia Dk PT, DPT Acute Rehabilitation Services Pager [phone] Office [phone]    [place] 09/03/2020, 5:26 PM

## 2020-09-03 NOTE — Progress Notes (Addendum)
[facility] Progress Note  Chief Complaint: C. difficile and hematochezia  History: Jason [name] continues to feel poorly overall, with generalized weakness, nausea, poor appetite and dull as well as crampy lower abdominal pain.  He has not had any overt GI bleeding since seen by GI yesterday.  Surgical consultants have seen him and their opinion is greatly appreciated.  Naturally, they have no plans for surgery other than for emergency purposes, and in that case would require a total colectomy. Jason [name] was working with physical therapy when I entered the room,, and his wife Jason [name] is there as well.  ROS: Cardiovascular: He denies chest pain Respiratory: Denies dyspnea Urinary: Denies dysuria  Objective:   Current Facility-Administered Medications:  .  0.9 %  sodium chloride infusion (Manually program via Guardrails IV Fluids), , Intravenous, Once, Cirigliano, Vito V, DO .  0.9 %  sodium chloride infusion, 100 mL, Intravenous, PRN, Cirigliano, Vito V, DO .  0.9 %  sodium chloride infusion, 100 mL, Intravenous, PRN, Cirigliano, Vito V, DO .  acetaminophen (TYLENOL) tablet 650 mg, 650 mg, Oral, Q6H PRN, 650 mg at 09/03/20 1452 **OR** acetaminophen (TYLENOL) suppository 650 mg, 650 mg, Rectal, Q6H PRN, Cirigliano, Vito V, DO .  alteplase (CATHFLO ACTIVASE) injection 2 mg, 2 mg, Intracatheter, Once PRN, Cirigliano, Vito V, DO .  Chlorhexidine Gluconate Cloth 2 % PADS 6 each, 6 each, Topical, Q0600, Cirigliano, Vito V, DO, 6 each at 09/02/20 0417 .  Chlorhexidine Gluconate Cloth 2 % PADS 6 each, 6 each, Topical, Q0600, Cirigliano, Vito V, DO, 6 each at 09/02/20 0417 .  Chlorhexidine Gluconate Cloth 2 % PADS 6 each, 6 each, Topical, Q0600, Cirigliano, Vito V, DO, 6 each at 09/02/20 0417 .  Darbepoetin Alfa (ARANESP) injection 60 mcg, 60 mcg, Intravenous, Q Sat-HD, Cirigliano, Vito V, DO, 60 mcg at 09/02/20 0214 .  dextrose 10 % infusion, , Intravenous, Continuous, Cirigliano, Vito V, DO, Last Rate: 75 mL/hr  at 09/01/20 1500, Infusion Verify at 09/01/20 1500 .  doxercalciferol (HECTOROL) injection 7 mcg, 7 mcg, Intravenous, Q T,Th,Sa-HD, Cirigliano, Vito V, DO, 7 mcg at 09/02/20 0217 .  heparin injection 1,000 Units, 1,000 Units, Dialysis, PRN, Cirigliano, Vito V, DO .  HYDROmorphone (DILAUDID) injection 0.5 mg, 0.5 mg, Intravenous, Q3H PRN, Cirigliano, Vito V, DO, 0.5 mg at 09/03/20 1440 .  hyoscyamine (LEVSIN SL) SL tablet 0.125 mg, 0.125 mg, Sublingual, Q6H PRN, Cirigliano, Vito V, DO, 0.125 mg at 09/03/20 0203 .  lidocaine (PF) (XYLOCAINE) 1 % injection 5 mL, 5 mL, Intradermal, PRN, Cirigliano, Vito V, DO .  lidocaine-prilocaine (EMLA) cream 1 application, 1 application, Topical, PRN, Cirigliano, Vito V, DO .  metoprolol tartrate (LOPRESSOR) injection 5 mg, 5 mg, Intravenous, Q6H PRN, Cirigliano, Vito V, DO, 5 mg at 08/31/20 1908 .  metroNIDAZOLE (FLAGYL) IVPB 500 mg, 500 mg, Intravenous, Q12H, Ghimire, Kuber, MD, Last Rate: 100 mL/hr at 09/03/20 0309, 500 mg at 09/03/20 0309 .  niacin tablet 50 mg, 50 mg, Oral, TID WC, Cirigliano, Vito V, DO, 50 mg at 08/31/20 1605 .  ondansetron (ZOFRAN) injection 4 mg, 4 mg, Intravenous, Q6H PRN, Cirigliano, Vito V, DO, 4 mg at 09/03/20 0549 .  pantoprazole (PROTONIX) injection 40 mg, 40 mg, Intravenous, Q12H, Cirigliano, Vito V, DO, 40 mg at 09/03/20 0917 .  pentafluoroprop-tetrafluoroeth (GEBAUERS) aerosol 1 application, 1 application, Topical, PRN, Cirigliano, Vito V, DO .  promethazine (PHENERGAN) injection 12.5 mg, 12.5 mg, Intravenous, Q6H PRN, Cirigliano, Vito V, DO, 12.5 mg at 09/03/20 0924 .  saccharomyces boulardii (FLORASTOR) capsule 250 mg, 250 mg, Oral, BID, Cirigliano, Vito V, DO, 250 mg at 09/03/20 0904 .  vancomycin (VANCOCIN) 50 mg/mL oral solution 500 mg, 500 mg, Oral, QID, Cirigliano, Vito V, DO, 500 mg at 09/03/20 1441  . sodium chloride    . sodium chloride    . dextrose 75 mL/hr at 09/01/20 1500  . metronidazole 500 mg (09/03/20 0309)      Vital signs in last 24 hrs: Vitals:   09/03/20 1100 09/03/20 1708  BP: 110/64 122/72  Pulse: 96 91  Resp: 17 16  Temp: 97.7 F (36.5 C) (!) 97.4 F (36.3 C)  SpO2: 99% 96%    Intake/Output Summary (Last 24 hours) at 09/03/2020 1728 Last data filed at 09/03/2020 1500 Gross per 24 hour  Intake 1390.5 ml  Output 0 ml  Net 1390.5 ml     Physical Exam Chronically ill-appearing, generalized poor muscle mass.  He was exhausted after transferring from a walker to bed.  He is also bothered by a sore perianal area from diarrhea.  HEENT: sclera anicteric, oral mucosa without lesions  Neck: supple, no thyromegaly, JVD or lymphadenopathy  Cardiac: RRR without murmurs, S1S2 heard, no peripheral edema  Pulm: clear to auscultation bilaterally, normal RR and effort noted  Abdomen: soft, mild lower tenderness, with active bowel sounds. No guarding or palpable hepatosplenomegaly  Skin; warm and dry, no jaundice  Recent Labs:  CBC Latest Ref Rng & Units 09/03/2020 09/02/2020 09/01/2020  WBC 4.0 - 10.5 K/uL 17.0(H) 19.5(H) 23.3(H)  Hemoglobin 13.0 - 17.0 g/dL 7.9(L) 9.1(L) 9.7(L)  Hematocrit 39.0 - 52.0 % 25.2(L) 28.3(L) 30.0(L)  Platelets 150 - 400 K/uL 272 318 330    No results for input(s): INR in the last 168 hours. CMP Latest Ref Rng & Units 09/03/2020 09/02/2020 09/02/2020  Glucose 70 - 99 mg/dL 84 86 88  BUN 8 - 23 mg/dL 24(H) - 24(H)  Creatinine 0.61 - 1.24 mg/dL 5.17(H) - 4.30(H)  Sodium 135 - 145 mmol/L 135 - 137  Potassium 3.5 - 5.1 mmol/L 3.1(L) - 3.2(L)  Chloride 98 - 111 mmol/L 95(L) - 96(L)  CO2 22 - 32 mmol/L 26 - 27  Calcium 8.9 - 10.3 mg/dL 8.7(L) - 8.4(L)  Total Protein 6.5 - 8.1 g/dL 5.1(L) - 5.8(L)  Total Bilirubin 0.3 - 1.2 mg/dL 0.6 - 0.8  Alkaline Phos 38 - 126 U/L 94 - 97  AST 15 - 41 U/L 12(L) - 15  ALT 0 - 44 U/L 9 - 9     Radiologic studies:   Assessment & Plan  Assessment: C. difficile colitis Chronic diarrhea of unclear cause over 2 years,  extensive work-up unrevealing. Superimposed ischemic colitis diagnosed 2 days ago. Severe protein calorie malnutrition, his prealbumin was low just prior to this hospitalization, and it is almost certainly worse now since he has had no meaningful nutrition during this entire stay due to his illness, intermittent ileus and lack of appetite. Acute on chronic debility Chronic kidney disease on hemodialysis Anemia of acute GI blood loss from recent pyloric ulcer and then more recent ischemic colitis on top of anemia chronic kidney disease Vitamin B3 deficiency of unclear clinical significance, now on oral replacement  I know this patient very well from the outpatient setting and have kept up-to-date with the events of this hospitalization last week while remaining in touch with Dr. Bryan Lemma. Jason [name] remains a very sick man with multiple acute and chronic medical issues.  I had a lengthy talk  with him  and his wife and then separately with his wife Jason [name] about the difficult situation he is in.  I am glad that a DNR status was obtained because I think that clearly recognizes the severity of his condition, and I agree he would be very unlikely to survive resuscitation efforts should a cardiac arrest occur.  It seems to me that the volume depletion from the C. difficile then led to ischemic colitis causing recurrent lower GI bleeding and most likely leading to a generalized ileus that cause nausea and vomiting and retained fluid seen in the stomach on EGD.  Jason [name] is receiving all the supportive care we can give him at this point as far as the C diff treatment and volume management.  He also must stay off Coumadin for the time being since the bleeding risk is too high.  My opinion is that malnutrition may be his greatest obstacle to recovering from this severe illness.  He wants to eat, but just feels so poorly he either cannot or will not do so.  The intermittent ileus as made that worse.  We will  communicate with the hospitalist service, because I think strong consideration should be given to TPN for this patient.  There is certainly the need for central line and then risk of infection and vascular thrombosis with that.  However, unless we turn his malnutrition around he is not likely to recover.  Meanwhile, I will add protein calorie oral supplements. In my opinion, he also seems likely to need short term rehab since it will be difficult for his wife to care for him at home in this weakened condition.  I will also communicate with Dr. Carlean Purl, our primary consult physician this week, to ensure continuity of the TPN discussion.  Total time 35 minutes (extensive chart review and discussion with patient and his wife)   [name] Office: [phone]

## 2020-09-04 ENCOUNTER — Inpatient Hospital Stay (HOSPITAL_COMMUNITY): Payer: Medicare Other

## 2020-09-04 ENCOUNTER — Inpatient Hospital Stay: Payer: Self-pay

## 2020-09-04 DIAGNOSIS — K567 Ileus, unspecified: Secondary | ICD-10-CM | POA: Diagnosis not present

## 2020-09-04 DIAGNOSIS — A0472 Enterocolitis due to Clostridium difficile, not specified as recurrent: Secondary | ICD-10-CM | POA: Diagnosis not present

## 2020-09-04 DIAGNOSIS — R140 Abdominal distension (gaseous): Secondary | ICD-10-CM | POA: Diagnosis not present

## 2020-09-04 DIAGNOSIS — K633 Ulcer of intestine: Secondary | ICD-10-CM | POA: Diagnosis not present

## 2020-09-04 DIAGNOSIS — K2970 Gastritis, unspecified, without bleeding: Secondary | ICD-10-CM | POA: Diagnosis not present

## 2020-09-04 LAB — COMPREHENSIVE METABOLIC PANEL
ALT: 8 U/L (ref 0–44)
AST: 12 U/L — ABNORMAL LOW (ref 15–41)
Albumin: 1.9 g/dL — ABNORMAL LOW (ref 3.5–5.0)
Alkaline Phosphatase: 92 U/L (ref 38–126)
Anion gap: 13 (ref 5–15)
BUN: 30 mg/dL — ABNORMAL HIGH (ref 8–23)
CO2: 23 mmol/L (ref 22–32)
Calcium: 8.5 mg/dL — ABNORMAL LOW (ref 8.9–10.3)
Chloride: 93 mmol/L — ABNORMAL LOW (ref 98–111)
Creatinine, Ser: 6.49 mg/dL — ABNORMAL HIGH (ref 0.61–1.24)
GFR, Estimated: 8 mL/min — ABNORMAL LOW (ref >60)
Glucose, Bld: 105 mg/dL — ABNORMAL HIGH (ref 70–99)
Potassium: 3 mmol/L — ABNORMAL LOW (ref 3.5–5.1)
Sodium: 129 mmol/L — ABNORMAL LOW (ref 135–145)
Total Bilirubin: 0.6 mg/dL (ref 0.3–1.2)
Total Protein: 5.1 g/dL — ABNORMAL LOW (ref 6.5–8.1)

## 2020-09-04 LAB — CBC WITH DIFFERENTIAL/PLATELET
Abs Immature Granulocytes: 0.36 10*3/uL — ABNORMAL HIGH (ref 0.00–0.07)
Basophils Absolute: 0 10*3/uL (ref 0.0–0.1)
Basophils Relative: 0 %
Eosinophils Absolute: 0.2 10*3/uL (ref 0.0–0.5)
Eosinophils Relative: 1 %
HCT: 24.4 % — ABNORMAL LOW (ref 39.0–52.0)
Hemoglobin: 7.9 g/dL — ABNORMAL LOW (ref 13.0–17.0)
Immature Granulocytes: 2 %
Lymphocytes Relative: 5 %
Lymphs Abs: 0.8 10*3/uL (ref 0.7–4.0)
MCH: 27.5 pg (ref 26.0–34.0)
MCHC: 32.4 g/dL (ref 30.0–36.0)
MCV: 85 fL (ref 80.0–100.0)
Monocytes Absolute: 1.7 10*3/uL — ABNORMAL HIGH (ref 0.1–1.0)
Monocytes Relative: 11 %
Neutro Abs: 12.8 10*3/uL — ABNORMAL HIGH (ref 1.7–7.7)
Neutrophils Relative %: 81 %
Platelets: 295 10*3/uL (ref 150–400)
RBC: 2.87 MIL/uL — ABNORMAL LOW (ref 4.22–5.81)
RDW: 20.2 % — ABNORMAL HIGH (ref 11.5–15.5)
WBC: 15.8 10*3/uL — ABNORMAL HIGH (ref 4.0–10.5)
nRBC: 0 % (ref 0.0–0.2)

## 2020-09-04 LAB — GLUCOSE, CAPILLARY
Glucose-Capillary: <10 mg/dL — CL (ref 70–99)
Glucose-Capillary: 110 mg/dL — ABNORMAL HIGH (ref 70–99)
Glucose-Capillary: 103 mg/dL — ABNORMAL HIGH (ref 70–99)
Glucose-Capillary: 86 mg/dL (ref 70–99)
Glucose-Capillary: 158 mg/dL — ABNORMAL HIGH (ref 70–99)

## 2020-09-04 LAB — MAGNESIUM: Magnesium: 1.8 mg/dL (ref 1.7–2.4)

## 2020-09-04 LAB — PHOSPHORUS: Phosphorus: 3.5 mg/dL (ref 2.5–4.6)

## 2020-09-04 MED ORDER — TRAVASOL 10 % IV SOLN
INTRAVENOUS | Status: DC
  Filled 2020-09-04: qty 864

## 2020-09-04 MED ORDER — TRAVASOL 10 % IV SOLN
INTRAVENOUS | Status: AC
  Filled 2020-09-04: qty 864

## 2020-09-04 MED ORDER — SODIUM CHLORIDE 0.9% FLUSH
10.0000 mL | Freq: Two times a day (BID) | INTRAVENOUS | Status: DC
  Administered 2020-09-04 – 2020-09-06 (×4): 10 mL
  Administered 2020-09-06: 40 mL
  Administered 2020-09-07 – 2020-09-14 (×12): 10 mL

## 2020-09-04 MED ORDER — ACETAMINOPHEN 325 MG PO TABS
ORAL_TABLET | ORAL | Status: AC
  Filled 2020-09-04: qty 2

## 2020-09-04 MED ORDER — POTASSIUM CHLORIDE 20 MEQ PO PACK: 40.0000 meq | PACK | ORAL | Status: DC

## 2020-09-04 MED ORDER — DARBEPOETIN ALFA 200 MCG/0.4ML IJ SOSY
200.0000 ug | PREFILLED_SYRINGE | INTRAMUSCULAR | Status: DC
  Administered 2020-09-08: 200 ug via INTRAVENOUS
  Filled 2020-09-04: qty 0.4

## 2020-09-04 MED ORDER — POTASSIUM CHLORIDE CRYS ER 20 MEQ PO TBCR
30.0000 meq | EXTENDED_RELEASE_TABLET | ORAL | Status: DC
  Filled 2020-09-04: qty 1

## 2020-09-04 MED ORDER — POTASSIUM CHLORIDE 20 MEQ PO PACK
40.0000 meq | PACK | Freq: Once | ORAL | Status: AC
  Administered 2020-09-04: 40 meq via ORAL
  Filled 2020-09-04: qty 2

## 2020-09-04 MED ORDER — DOXERCALCIFEROL 4 MCG/2ML IV SOLN
INTRAVENOUS | Status: AC
  Filled 2020-09-04: qty 4

## 2020-09-04 MED ORDER — DEXTROSE-NACL 5-0.9 % IV SOLN: INTRAVENOUS | Status: AC

## 2020-09-04 MED ORDER — MAGNESIUM SULFATE 2 GM/50ML IV SOLN
2.0000 g | Freq: Once | INTRAVENOUS | Status: AC
  Administered 2020-09-04: 2 g via INTRAVENOUS
  Filled 2020-09-04: qty 50

## 2020-09-04 MED ORDER — INSULIN ASPART 100 UNIT/ML ~~LOC~~ SOLN: 0.0000 [IU] | SUBCUTANEOUS | Status: DC

## 2020-09-04 MED ORDER — SODIUM CHLORIDE 0.9% FLUSH
10.0000 mL | INTRAVENOUS | Status: DC | PRN
  Administered 2020-09-11: 10 mL

## 2020-09-04 NOTE — Progress Notes (Signed)
Initial Nutrition Assessment  DOCUMENTATION CODES:   Severe malnutrition in context of chronic illness,Underweight  INTERVENTION:   TPN to meet 100% of nutritional needs. Discussed TPN with pharmacist.   Will monitor pt closely for ability to transition to oral nutrition or enteral nutrition.  Pt at high refeeding risk; recommend monitor potassium, magnesium, and phosphorus labs Q12 until stable.   Concerned that given significant drop in potassium and phosphorus, pt may be currently refeeding from initiation of TPN 3/01.   NUTRITION DIAGNOSIS:   Severe Malnutrition related to chronic illness (ESRD, COPD chronic in nature exascerbated by acute GI related illness) as evidenced by severe fat depletion,severe muscle depletion,moderate muscle depletion,moderate fat depletion,energy intake < 75% for > or equal to 1 month.  GOAL:   Patient will meet greater than or equal to 90% of their needs  MONITOR:   Labs,Weight trends,Skin,Diet advancement  REASON FOR ASSESSMENT:   Consult New TPN/TNA  ASSESSMENT:   68 YOM admitted for gastrointestinal bleeding. PMH of HTN, diverticulosis, anemia, COPD, rectal ca s/p XRT and LAR w/ diverting loop ileostomy with known rectal stricture, CVA, A. Fib, PUD. Ongoing ESRD on HD TTS, MBD CKD, C. Diff., B3 deficiency, severe esophagitis, n/v/diarrhea, ileus. Potential for sigmoid resection and possible total abdominal colectomy, permanent colostomy vs ileostomy if condition does not improve.    2/09 - hemostasis clip placement, hemostasis control 2/14 - EGD with duodenal ulcer w VV, injected and endoclipped 2/26 - EGD/colonoscopy, biopsy, flexible sigmoidoscopy  Per chart meal documentation, pt has been consuming 0-50% of meals, however no documentation since 2/26. Spoke with wife at pt's bedside. Wife mentioned in the last few months bad appetite and has not been consuming much of his meals PTA or while admitted. She mentioned that pt typically  consumes:  Dialysis Days Dinner - sandwich (deli meat or chicken salad or grilled cheese) and a piece of fruit  Snack - will eat snack occasionally, but could not remember what this usually is   Non-Dialysis Days  Breakfast: cereal and milk or scrambled eggs and bacon or sausage and a few pieces of toast Lunch: hamburger and sometimes french fries  Dinner: leftover lunch or oxtail stew or beef stew  Pt likes to drink soda (sprite) or juice (apple juice or orange juice) very seldom drinks water.  Last few days PTA he was having nausea, vomiting, diarrhea, abdominal pain. Pt's chart notes a niacin deficiency.   Per chart, pt's EDW is 58 kg. Pt was unsure of his EDW, but did mention that he had lost a significant amount of weight in the past 3-4 months. Pt's wife mentioned that pt had been consistently losing weight since he started dialysis in 2019. Per chart, pt currently weighs 52.5 kg indicating a 10.5% weight loss.  Meds reviewed: Hectorol (TTS), Niacin (TID) Labs reviewed: Potassium (3.2, low), Corrected Calcium (10.08), Phosphorus (1.7, low)  NUTRITION - FOCUSED PHYSICAL EXAM:  Flowsheet Row Most Recent Value  Orbital Region Moderate depletion  Upper Arm Region Moderate depletion  Thoracic and Lumbar Region Severe depletion  Buccal Region Severe depletion  Temple Region Severe depletion  Clavicle Bone Region Moderate depletion  Clavicle and Acromion Bone Region Moderate depletion  Scapular Bone Region Moderate depletion  Dorsal Hand Moderate depletion  Patellar Region Severe depletion  Anterior Thigh Region Severe depletion  Posterior Calf Region Severe depletion  Edema (RD Assessment) None  Hair Reviewed  Eyes Reviewed  Mouth Reviewed  Skin Reviewed  Nails Reviewed  Diet Order:   Diet Order            Diet NPO time specified  Diet effective now                 EDUCATION NEEDS:   No education needs have been identified at this time  Skin:  Skin  Assessment: Skin Integrity Issues: Skin Integrity Issues:: Incisions Incisions: open, R buttocks  Last BM:  3/02 (type 7)  Height:   Ht Readings from Last 1 Encounters:  08/26/20 5\' 8"  (1.727 m)    Weight:   Wt Readings from Last 1 Encounters:  09/04/20 52.5 kg    Ideal Body Weight:  70 kg  BMI:  Body mass index is 17.6 kg/m.  Estimated Nutritional Needs:   Kcal:  1900-2100  Protein:  100-115 g  Fluid:  1L + UOP    Salvadore Oxford, Dietetic Intern 09/05/2020 1:59 PM

## 2020-09-04 NOTE — Telephone Encounter (Signed)
Patient is currently hospitalized for abdominal pain and rectal bleeding. Will defer medication refills until after discharge.

## 2020-09-04 NOTE — Progress Notes (Addendum)
Progress Note  Chief Complaint:    C-diff colitis with probable ischemia, malnutrition     ASSESSMENT / PLAN:    # C-diff colitis / GI bleeding with suspicion for ischemia colitis on recent flexible sigmoidoscopy ( path pending). Surgery is following. WBC downtrending, no peritoneal signs. Surgery recommends continuation of antibiotics, colectomy only if condition deteriorates.  --On # 1 day Vancomycin / flagyl and Florastor. WBC 17 >> 15.8. His abdomen is tender and he complains of mid abdominal discomfort.  I spoke with patient's RN who also had him yesterday. Patient is still having several episodes of nonbloody loose stools a day.  Will consider extension of flagyl / vanco vrs changing to Dificid vrs monitor for now as patient has history of chronic diarrhea with unremarkable extensive workup in past.  # Severe esophagitis.  --Continue BID IV PPI  # Nausea, vomiting / Ileus ( secondary to illness, c-diff, ischemic colitis). NGT was unable to be placed. He does have bowel sounds and not significantly distended on exam ( but still very tender). Today's abdominal films are pending.  --Tolerating very limited PO. For TNA  # Malnutrition secondary to prolonged illness / ileus. Albumin 1.9 --Getting PICC to start TNA  # Chronic diarrhea of unclear cause, extensive evaluation unrevealing.   # ESRD on HD   # Remote hx of colon cancer s/p chemoradiation and LAR. Known rectal stricture.          [facility] Attending   I have taken an interval history, reviewed the chart and saw the patient. I agree with the Advanced Practitioner's note, impression and recommendations.   Persistent ileus Malnutrition C diff + suspected ischemic colitis Nicotinic acid deficiency   Continue supportive care. Long odds for full recovery  Gatha Mayer, MD, [facility] 09/04/2020 6:01 PM   SUBJECTIVE:   "a little" abdominal pain. Ongoing nausea, vomited this am but then  tolerated a small amount of soup. Says he had several episodes of diarrhea during the night    OBJECTIVE:    Scheduled inpatient medications:  . sodium chloride   Intravenous Once  . Chlorhexidine Gluconate Cloth  6 each Topical Q0600  . Chlorhexidine Gluconate Cloth  6 each Topical Q0600  . Chlorhexidine Gluconate Cloth  6 each Topical Q0600  . [START ON 09/08/2020] darbepoetin (ARANESP) injection - DIALYSIS  200 mcg Intravenous Q Sat-HD  . doxercalciferol  7 mcg Intravenous Q T,Th,Sa-HD  . feeding supplement  1 Container Oral TID BM  . insulin aspart  0-6 Units Subcutaneous Q4H  . niacin  50 mg Oral TID WC  . pantoprazole (PROTONIX) IV  40 mg Intravenous Q12H  . potassium chloride  40 mEq Oral Once  . saccharomyces boulardii  250 mg Oral BID   Continuous inpatient infusions:  . sodium chloride    . sodium chloride    . dextrose 75 mL/hr at 09/04/20 0606  . magnesium sulfate bolus IVPB    . metronidazole 500 mg (09/04/20 0438)  . TPN ADULT (ION)     PRN inpatient medications: sodium chloride, sodium chloride, acetaminophen **OR** acetaminophen, alteplase, heparin, HYDROmorphone (DILAUDID) injection, hyoscyamine, lidocaine (PF), lidocaine-prilocaine, metoprolol tartrate, ondansetron (ZOFRAN) IV, pentafluoroprop-tetrafluoroeth, promethazine  Vital signs in last 24 hours: Temp:  [97.4 F (36.3 C)-98.8 F (37.1 C)] 98.5 F (36.9 C) (03/01 1132) Pulse Rate:  [51-110] 100 (03/01 1132) Resp:  [12-17] 15 (03/01 1132) BP: (106-143)/(42-76) 125/67 (03/01 1132) SpO2:  [96 %-100 %] 96 % (03/01  1132) Weight:  [52.5 kg-54 kg] 52.5 kg (03/01 1032) Last BM Date: 09/03/20  Intake/Output Summary (Last 24 hours) at 09/04/2020 1246 Last data filed at 09/04/2020 1032 Gross per 24 hour  Intake 2378.42 ml  Output 1602 ml  Net 776.42 ml     Physical Exam:  . General: Alert thin male in NAD. Wife in room . Heart:  Regular rate and rhythm. No lower extremity edema . Pulmonary: Normal  respiratory effort . Abdomen: Soft, mildly distended, a few bowel sounds. Significantl mid abdominal tenderness with voluntary guarding.   . Neurologic: Alert and oriented . Psych: Pleasant. Cooperative.   Filed Weights   09/03/20 0647 09/04/20 0725 09/04/20 1032  Weight: 52.3 kg 54 kg 52.5 kg    Intake/Output from previous day: 02/28 0701 - 03/01 0700 In: 2618.4 [P.O.:720; I.V.:1696.8; IV Piggyback:201.7] Out: 102 [Urine:100; Stool:2] Intake/Output this shift: Total I/O In: -  Out: 1500 [Other:1500]    Lab Results: Recent Labs    09/02/20 0343 09/03/20 0053 09/04/20 0124  WBC 19.5* 17.0* 15.8*  HGB 9.1* 7.9* 7.9*  HCT 28.3* 25.2* 24.4*  PLT 318 272 295   BMET Recent Labs    09/02/20 0343 09/02/20 0849 09/03/20 0053 09/04/20 0124  NA 137  --  135 129*  K 3.2*  --  3.1* 3.0*  CL 96*  --  95* 93*  CO2 27  --  26 23  GLUCOSE 88 86 84 105*  BUN 24*  --  24* 30*  CREATININE 4.30*  --  5.17* 6.49*  CALCIUM 8.4*  --  8.7* 8.5*   LFT Recent Labs    09/04/20 0124  PROT 5.1*  ALBUMIN 1.9*  AST 12*  ALT 8  ALKPHOS 92  BILITOT 0.6   PT/INR No results for input(s): LABPROT, INR in the last 72 hours. Hepatitis Panel No results for input(s): HEPBSAG, HCVAB, HEPAIGM, HEPBIGM in the last 72 hours.  [place] EKG SITE RITE  Result Date: 09/04/2020 If Site Rite image not attached, placement could not be confirmed due to current cardiac rhythm.  [place] EKG SITE RITE  Result Date: 09/03/2020 If [facility] image not attached, placement could not be confirmed due to current cardiac rhythm.     Active Problems:   Hematochezia   GIB (gastrointestinal bleeding)   C. difficile colitis   Generalized abdominal pain   Non-intractable vomiting   Abdominal distension (gaseous)   Gastritis and gastroduodenitis   Noninfectious gastroenteritis   Colon ulcer   Intestinal occlusion ([place])     LOS: 11 days   Tye Savoy ,NP 09/04/2020, 12:46 PM

## 2020-09-04 NOTE — Procedures (Signed)
Patient was seen on dialysis and the procedure was supervised.  BFR 400  Via AVF BP is  118/48.   Patient appears to be tolerating treatment well  Jason [name] 09/04/2020

## 2020-09-04 NOTE — TOC Progression Note (Signed)
Transition of Care (TOC) - Progression Note    Patient Details  Name: Jason KAIL Sr. MRN: [id number] Date of Birth: June 21, 1946  Transition of Care [facility]) CM/SW [place], [place] Phone Number: 09/04/2020, 2:17 PM  Clinical Narrative:     CSW attempted to meet with pt to discuss SNF recommendation. Pt is out of room for an xray. Pt's wife is present. CSW briefly spoke with wife about CSW role at hospital and explained he would return later to speak with pt.   1400: CSW met with pt who is agreeable to SNF Pt wants wife included in SNF decision. Upon further chart review, pt has already been faxed out to SNF facilities. SNF choice needs to be made.   Expected Discharge Plan: Skilled Nursing Facility Barriers to Discharge: Continued Medical Work up,Insurance Authorization,No SNF bed  Expected Discharge Plan and Services Expected Discharge Plan: [place] arrangements for the past 2 months: Single Family Home                                       Social Determinants of Health (SDOH) Interventions    Readmission Risk Interventions No flowsheet data found.

## 2020-09-04 NOTE — Progress Notes (Signed)
Appropriate Use Committee Chart Review  Chart reviewed by the physician advisor with input from [facility] and the attending MD as needed for review of the appropriateness for SNF referral.  TOC notes, PT/OT/ST notes, nursing notes and physician notes reviewed for medical necessity to determine if the patient's needs are appropriate for short-term rehab to return to a prior level of function versus the likely need for custodial care.  At this time, the patient meets Medicare criteria for SNF placement. Attending confirms that patient was fairly functional with ADLs at home and wishes to return to that level of care. The palliative care team is following, and the attending has indicated that ongoing goals of care are being discussed with the patient.   Recommendations: The patient is SNF appropriate for short-term rehab/skilled nursing interventions.     Jacquelynn Cree, MD Chief Physician Advisor  09/04/2020 11:30 AM

## 2020-09-04 NOTE — Progress Notes (Signed)
Jason [name] Progress Note   Subjective:   Patient seen in dialysis-  Getting d10 at 75  Objective Vitals:   09/04/20 0830 09/04/20 0857 09/04/20 0900 09/04/20 0930  BP: (!) 122/51  (!) 137/46 (!) 119/42  Pulse: (!) 53 91 (!) 110 (!) 51  Resp: 15 15 16 14   Temp:      TempSrc:      SpO2: 100% 100% 100% 100%  Weight:      Height:       Physical Exam General: elderly, ill appearing male, very tired- pleasant even though he feels poorly  Heart:+tachycardia, RR, no mrg Lungs:mostly CTAB, breath sounds decreased bilaterally Abdomen: soft, mildly tender throughout Extremities:no LE edema Dialysis Access: LU AVF +bruit  OP HD: [facility]  4h  450/500  58kg  3K/2Ca   P2    L AVF    Heparin none  Mircera 50 (to start 2/22) Venofer 50  Hectorol 7   Assessment/Plan: 1. C. Diff colitis - on po Vanc/Florastor w/ persistent abd pain and diarrhea.CTA abd/pelvis 2/24 suspicious for ileus, no obstruction. NG placed.  Flex sig 2/26 showed severely ulcerated mucosa of the sigmoid colon/ rectum, possibly radiation changes vs ischemia vs infection. Per primary/GI. Now needing to do TPN.  I give permission for them to do PICC line a has a good functioning AVF 2. GI bleed/ ABL anemia:  w/ supra therapeutic INR on admit (during previous admissionEGD2/9with duodenal ulcer, treated w/ clip+ injection). On Protonix. S/p 2u prbcs 2/19. Hgb improved to 9's s/p 1unit pRBC 2/24. Significant dec in hgb yest-  On low dose darbe-  Will look to inc dose for next Saturday  3. ESRD - HD TTS. Next HD 3/3. No heparin via AVF.  K is low-  Can only do 3 K bath-  No 4 k avail -  Supplement PO-  Now starting TPN 4. AFib- on coumadin. Supratherapeutic INR on admission.Reversed with Vit K. Coumadin beingheld. 5. HTN/Volume - BP's normal today.Under dry wt.  Has been getting d10 at 75 per hour--  Sodium low pre HD so challenging a little  6. Anemia CKD --see #1. Hgb9's, has had 3u prbc's here  total. Continue aranesp - increase dose to 200qwk q Sat 7. MBD CKD --Corr [place].  Continue home binders when able to tolerate PO. Hold Sensipar for now. Cont home hectorol dosing with HD 8. COPD  9. H/o prior rectal cancer - in 2014 treated w/ chemoRx,radiation, laparoscopic low anterior resection with diverting loop ileostomy and subsequent ileostomy takedown. 10. GOC - seen by palliative care, pt is now DNR    Jason [name]  09/01/2020, 10:01 AM   Filed Weights   09/02/20 0433 09/03/20 0647 09/04/20 0725  Weight: 52.3 kg 52.3 kg 54 kg    Intake/Output Summary (Last 24 hours) at 09/04/2020 1002 Last data filed at 09/04/2020 0529 Gross per 24 hour  Intake 2618.42 ml  Output 102 ml  Net 2516.42 ml    Additional Objective Labs: Basic Metabolic Panel: Recent Labs  Lab 09/01/20 0144 09/01/20 0651 09/01/20 2120 09/02/20 0343 09/02/20 0849 09/03/20 0053 09/04/20 0124  NA 134* 132*  --  137  --  135 129*  K 3.6 4.7  --  3.2*  --  3.1* 3.0*  CL 93* 94*  --  96*  --  95* 93*  CO2 24 18*  --  27  --  26 23  GLUCOSE 136* 108*   < > 88 86 84  105*  BUN 40* 44*  --  24*  --  24* 30*  CREATININE 6.64* 6.82*  --  4.30*  --  5.17* 6.49*  CALCIUM 8.7* 8.7*  --  8.4*  --  8.7* 8.5*  PHOS 4.7* 5.1*  --   --   --   --  3.5   < > = values in this interval not displayed.   Liver Function Tests: Recent Labs  Lab 09/02/20 0343 09/03/20 0053 09/04/20 0124  AST 15 12* 12*  ALT 9 9 8   ALKPHOS 97 94 92  BILITOT 0.8 0.6 0.6  PROT 5.8* 5.1* 5.1*  ALBUMIN 2.2* 1.9* 1.9*   CBC: Recent Labs  Lab 09/01/20 0144 09/01/20 0651 09/02/20 0343 09/03/20 0053 09/04/20 0124  WBC 24.3* 23.3* 19.5* 17.0* 15.8*  NEUTROABS 22.2*  --  18.1* 14.4* 12.8*  HGB 8.9* 9.7* 9.1* 7.9* 7.9*  HCT 28.4* 30.0* 28.3* 25.2* 24.4*  MCV 86.9 86.5 85.0 85.4 85.0  PLT 353 330 318 272 295   Blood Culture    Component Value Date/Time   SDES IN/OUT CATH URINE 08/17/2020 1234   SPECREQUEST  08/17/2020  1234    NONE Performed at [facility], [place] [address] [place], [place] [address]    CULT 4,000 COLONIES/mL ENTEROCOCCUS GALLINARUM (A) 08/17/2020 1234   REPTSTATUS 08/21/2020 FINAL 08/17/2020 1234   CBG: Recent Labs  Lab 09/03/20 1706 09/03/20 2127 09/03/20 2355 09/04/20 0442 09/04/20 0944  GLUCAP 99 115* 106* 110* 103*   Medications: . sodium chloride    . sodium chloride    . dextrose 75 mL/hr at 09/04/20 0606  . magnesium sulfate bolus IVPB    . metronidazole 500 mg (09/04/20 0438)   . sodium chloride   Intravenous Once  . Chlorhexidine Gluconate Cloth  6 each Topical Q0600  . Chlorhexidine Gluconate Cloth  6 each Topical Q0600  . Chlorhexidine Gluconate Cloth  6 each Topical Q0600  . darbepoetin (ARANESP) injection - DIALYSIS  60 mcg Intravenous Q Sat-HD  . doxercalciferol  7 mcg Intravenous Q T,Th,Sa-HD  . feeding supplement  1 Container Oral TID BM  . niacin  50 mg Oral TID WC  . pantoprazole (PROTONIX) IV  40 mg Intravenous Q12H  . potassium chloride  30 mEq Oral Q3H  . saccharomyces boulardii  250 mg Oral BID

## 2020-09-04 NOTE — Progress Notes (Signed)
PHARMACY - TOTAL PARENTERAL NUTRITION CONSULT NOTE   Indication: Prolonged ileus in the setting of ischemic colitis  Patient Measurements: Height: 5\' 8"  (172.7 cm) Weight: 52.3 kg (115 lb 4.8 oz) IBW/kg (Calculated) : 68.4   Body mass index is 17.53 kg/m. Usual Weight: ~63kg  Assessment:  [name] with hx rectal cancer s/p radiation/chemo, anteriro resection and ileostomy reversal, afib on warfarin with admit INR 8.8 now s/p reversal, ESRD on HD TTS, PUD with recent GIB, anemia of chronic disease, CVA, COPD, gout, and HTN admitted with melena, C.diff colitis, prolonged ileus, malnutrition with poor PO intake >11 days and hypoglycemia requiring D10 infusion. Patient lost ~15% of body weight in one month. Failed attempt to place NGT 2/26. PICC placement ok'd by nephrology. Pharmacy consulted for TPN.   Glucose / Insulin: BG <120 on D10 @ 75 ml/hr (180g dextrose/d), A1C 5.5 Electrolytes: Na 129, K 3> 40 mEq PO ordered, Cl 93, CoCa 10.2; Mg 1.8> 2g IV ordered Renal: HD TTS  Hepatic: LFTs low-normal, albumin 1.9, prealbumin 19 Intake / Output; MIVF: 2.6L in; UOP not well charted, BM x3-4 per patient GI Imaging: 2/18 CT Abd- Wall thickening of the sigmoid colon and rectum, consistent with acute colitis/proctitis 2/24 CT Abd with IMA occlusion but celiac and SMA patent; no evidence of acute ischemia. Increased distension of the stomach, small bowel and colon since 08/24/2020. 2/25 DG abd- no NGT, Gastric and small bowel dilatation GI Surgeries / Procedures:  2/25 EGD- Mildly severe esophagitis with a small 2-3 mm ulcer with no bleeding; Retained fluid and small amounts of solid debris in stomach; attempted NGT multiple times but unable to advance  2/26 Flex sig -severely ulcerated mucosa of the sigmoid colon/ rectum, possibly radiation changes vs ischemia vs infection Central access: ordered TPN start date: 3/1 pending CVC  Nutritional Goals (RD recommendations pending): For now will target  avg kCal: 1600/d, Protein: 70-100g, Fluid: ~1574ml Goal TPN rate is 60 mL/hr (provides 86g of protein and avg of 1547 kcals per day)  Current Nutrition:  CLD  Plan:  Start TPN at goal 90mL/hr at 1800 -provides 86g of protein and avg of 1500 kcals per day) 50g lipids on MWF only due to national shortage Electrolytes in TPN: Na 78mEq/L, K 6mEq/L, Ca 70mEq/L, Mg 38mEq/L, and Phos 11mmol/L. Cl:Ac 1:1 Add standard MVI and trace elements to TPN, remove chromium for ESRD Initiate very Sensitive q4h SSI with TPN and adjust as needed  Stop D10 75 mL/hr at 1800 Monitor TPN labs daily until stable then on Mon/Thurs Additional IVF per team   Benetta Spar, PharmD, BCPS, [facility]  Please check AMION for all [place] phone numbers After 10:00 PM, call [place]

## 2020-09-04 NOTE — Progress Notes (Signed)
As per secure chat Dr. Corliss Parish states that it [facility] to proceed with PICC placement.  Will plan on placing later today.

## 2020-09-04 NOTE — Progress Notes (Signed)
Peripherally Inserted Central Catheter Placement  The IV Nurse has discussed with the patient and/or persons authorized to consent for the patient, the purpose of this procedure and the potential benefits and risks involved with this procedure.  The benefits include less needle sticks, lab draws from the catheter, and the patient may be discharged home with the catheter. Risks include, but not limited to, infection, bleeding, blood clot (thrombus formation), and puncture of an artery; nerve damage and irregular heartbeat and possibility to perform a PICC exchange if needed/ordered by physician.  Alternatives to this procedure were also discussed.  Bard Power PICC patient education guide, fact sheet on infection prevention and patient information card has been provided to patient /or left at bedside.    PICC Placement Documentation  PICC Double Lumen 22/63/33 PICC Right Basilic 39 cm 1 cm (Active)  Indication for Insertion or Continuance of Line Administration of hyperosmolar/irritating solutions (i.e. TPN, Vancomycin, etc.) 09/04/20 1654  Exposed Catheter (cm) 1 cm 09/04/20 1654  Site Assessment Clean;Dry;Intact 09/04/20 1654  Lumen #1 Status Flushed;Saline locked;Blood return noted 09/04/20 1654  Lumen #2 Status Flushed;Saline locked;Blood return noted 09/04/20 1654  Dressing Type Transparent 09/04/20 1654  Dressing Status Clean;Dry;Intact 09/04/20 1654  Antimicrobial disc in place? Yes 09/04/20 1654  Dressing Intervention New dressing 09/04/20 1654  Dressing Change Due 09/11/20 09/04/20 1654       Gordan Payment 09/04/2020, 4:55 PM

## 2020-09-04 NOTE — Progress Notes (Signed)
Calls were made to patient for a med delivery, messages left, patient did not return calls. Patient is still currently in the hospital.   Wendy Poet, [place] [phone]

## 2020-09-04 NOTE — Progress Notes (Signed)
Progress Note  3 Days Post-Op  Subjective: Patient reports abdomen still feels sore all over but it is a little better than yesterday. He still has some nausea and hasn't been able to take in a lot. He had 3-4 BM yesterday.  Objective: Vital signs in last 24 hours: Temp:  [97.4 F (36.3 C)-98.8 F (37.1 C)] 98.8 F (37.1 C) (03/01 0725) Pulse Rate:  [83-109] 103 (03/01 0730) Resp:  [12-18] 15 (03/01 0730) BP: (104-143)/(50-76) 129/69 (03/01 0730) SpO2:  [96 %-100 %] 100 % (03/01 0725) Weight:  [54 kg] 54 kg (03/01 0725) Last BM Date: 09/03/20  Intake/Output from previous day: 02/28 0701 - 03/01 0700 In: 2618.4 [P.O.:720; I.V.:1696.8; IV Piggyback:201.7] Out: 102 [Urine:100; Stool:2] Intake/Output this shift: No intake/output data recorded.  PE: General: pleasant, WD, cachectic male who is laying in HD unit in NAD Heart: regular, rate, and rhythm.   Lungs: CTAB, no wheezes, rhonchi, or rales noted.  Respiratory effort nonlabored Abd: soft, mild generalized ttp without peritonitis or guarding, ND, +BS, no masses, hernias, or organomegaly MS: all 4 extremities are symmetrical with no cyanosis, clubbing, or edema. Skin: warm and dry with no masses, lesions, or rashes Psych: A&Ox3 with an appropriate affect.    Lab Results:  Recent Labs    09/03/20 0053 09/04/20 0124  WBC 17.0* 15.8*  HGB 7.9* 7.9*  HCT 25.2* 24.4*  PLT 272 295   BMET Recent Labs    09/03/20 0053 09/04/20 0124  NA 135 129*  K 3.1* 3.0*  CL 95* 93*  CO2 26 23  GLUCOSE 84 105*  BUN 24* 30*  CREATININE 5.17* 6.49*  CALCIUM 8.7* 8.5*   PT/INR No results for input(s): LABPROT, INR in the last 72 hours. CMP     Component Value Date/Time   NA 129 (L) 09/04/2020 0124   NA 140 02/24/2017 0000   NA 138 02/20/2014 1432   K 3.0 (L) 09/04/2020 0124   K 3.5 02/20/2014 1432   CL 93 (L) 09/04/2020 0124   CL 101 12/09/2012 1048   CO2 23 09/04/2020 0124   CO2 30 (H) 02/20/2014 1432   GLUCOSE  105 (H) 09/04/2020 0124   GLUCOSE 93 02/20/2014 1432   GLUCOSE 96 12/09/2012 1048   BUN 30 (H) 09/04/2020 0124   BUN 50 (A) 08/10/2017 0000   BUN 36.0 (H) 02/20/2014 1432   CREATININE 6.49 (H) 09/04/2020 0124   CREATININE 2.4 (H) 02/20/2014 1432   CALCIUM 8.5 (L) 09/04/2020 0124   CALCIUM 10.4 02/20/2014 1432   PROT 5.1 (L) 09/04/2020 0124   PROT 7.2 02/20/2014 1432   ALBUMIN 1.9 (L) 09/04/2020 0124   ALBUMIN 3.8 02/20/2014 1432   AST 12 (L) 09/04/2020 0124   AST 22 02/20/2014 1432   ALT 8 09/04/2020 0124   ALT 12 02/20/2014 1432   ALKPHOS 92 09/04/2020 0124   ALKPHOS 86 02/20/2014 1432   BILITOT 0.6 09/04/2020 0124   BILITOT 0.32 02/20/2014 1432   GFRNONAA 8 (L) 09/04/2020 0124   GFRAA 8 (L) 04/08/2019 1702   Lipase     Component Value Date/Time   LIPASE 47 08/15/2020 0659       Studies/Results: [place] EKG SITE RITE  Result Date: 09/03/2020 If Site Rite image not attached, placement could not be confirmed due to current cardiac rhythm.   Anti-infectives: Anti-infectives (From admission, onward)   Start     Dose/Rate Route Frequency Ordered Stop   09/01/20 1600  metroNIDAZOLE (FLAGYL) IVPB 500 mg  500 mg 100 mL/hr over 60 Minutes Intravenous Every 12 hours 09/01/20 1315     08/26/20 1000  vancomycin (VANCOCIN) 50 mg/mL oral solution 500 mg        500 mg Oral 4 times daily 08/26/20 0740 09/03/20 2159   08/24/20 2100  vancomycin (VANCOCIN) 50 mg/mL oral solution 125 mg  Status:  Discontinued        125 mg Oral 4 times daily 08/24/20 1717 08/26/20 0740       Assessment/Plan Hx of rectal CA s/p LAR and chemo/radiation  HTN Hx of CVA COPD ESRD on HD A. Fib  C.Diff colitis - WBC 15.8 from 17, afebrile - patient with mild generalized ttp but no peritonitis and feels like pain has improved slightly - would not recommend surgical intervention acutely unless patient develops peritoneal signs of clinically deteriorates - continue IV flagyl, he has completed PO  vanc - keep on CLD for now with nausea  FEN: CLD VTE: heparin with HD ID: PO vanc 2/18>2/28; IV flagyl 1/26>>  LOS: 11 days    [name] , [facility] 09/04/2020, 7:50 AM Please see Amion for pager number during day hours 7:00am-4:30pm

## 2020-09-04 NOTE — Progress Notes (Addendum)
PROGRESS NOTE    Jason BERNSTEIN Sr.  AQT:[id number] DOB: 03-22-1946 DOA: 08/24/2020 PCP: [name], MD    Brief Narrative:  Jason Cotta Sr. is a 75 year old gentleman with past medical history significant for chronic atrial fibrillation on Coumadin, peptic ulcer disease, ESRD on HD TTS, history of rectal cancer status post low anterior resection and reversal of ileostomy with chemotherapy/radiation who presented to the ED with abdominal pain/cramping, nausea/vomiting/diarrhea and rectal bleeding. Patient with follow-up with his GI physician due to recent hospitalization from GI bleed due to duodenal ulcer, patient had repeat labs performed which was notably positive for C. difficile colitis. Patient was started on medication for his C. difficile infection outpatient, but he noticed a large amount of blood in his stool which was concerning which prompted further evaluation in the ED.  In the ED, patient was found to have an elevated INR, he was given oral vitamin K and started on Protonix. [facility] was consulted. Hospital service consulted for further evaluation and management of GI bleed with supratherapeutic INR in the setting of C. difficile colitis.   Assessment & Plan:   Active Problems:   Hematochezia   GIB (gastrointestinal bleeding)   C. difficile colitis   Generalized abdominal pain   Non-intractable vomiting   Abdominal distension (gaseous)   Gastritis and gastroduodenitis   Noninfectious gastroenteritis   Colon ulcer   Intestinal occlusion (HCC)   C. difficile colitis CT angiogram abdomen/pelvis 2/24 suspicious for ileus, no obstruction. Flexible sigmoidoscopy 09/01/2020 with severely ulcerated mucosa at the sigmoid colon/rectum, possibly radiation changes versus ischemia versus infection. --WBC 17.4>30.935.7>29.0>23.3>19.5>17.0>15.8 --Completed course of oral vancomycin --Metronidazole 500 mg IV q12h --Florastor 250 mg p.o. twice daily --CBC daily --Continues with  diffuse abdominal pain and multiple bowel movements daily, GI to consider extended course of vancomycin/Flagyl versus Dificid  Acute on chronic GI bleeding Acute blood loss anemia, POA Supratherapeutic INR, POA Patient reports large bloody bowel movement prior to ED presentation. INR notably elevated at 8.8 on admission. Received oral vitamin K for reversal. Recent hospitalization with finding of duodenal ulcer on EGD. Discharged on Protonix twice daily. EGD on 09/01/2020 with no evidence of active bleeding. Colonoscopy 2/26 with findings consistent with severe radiation changes, proctitis, ischemic vs inflammatory colitis, strictures. --[facility] following, appreciate assistance. --Hgb 8.9>6.8>>8.6>7.0>9.6>8.9>9.7>9.1>7.9 --s/p 2u pRBC 2/19 and 1 u pRBC 2/24 --Aranesp 14mcg every Sat w/ HD --Continue to monitor hemoglobin daily, transfuse for Hgb <7.0 or active bleeding --CBC in am  Nausea/vomiting Ileus Hx rectal cancer s/p LAR, XRT, chemotherapy No gastric outlet obstruction noted on endoscopy. Colonoscopy notable for anal stenosis with rectal stenosis and ulceration from previous surgery/radiation. CT angiogram abdomen/pelvis with occlusion IMA but patent celiac and SMA with collateral filling. --General surgery following, appreciate assistance --Abdominal x-ray this morning with persistent distention small bowel loops slightly decreased from previous exam --NPO; PICC line ordered to start TPN today --If patient develops peritoneal signs or clinical deterioration, would require colectomy surgery  Chronic atrial fibrillation on Coumadin Supratherapeutic INR INR 8.8 admission, reversed with oral vitamin K. --Continue to hold Coumadin --Continue to monitor on telemetry  ESRD on HD TTS --Nephrology following, appreciate assistance --Continue HD inpatient per nephrology  Suspected B3 deficiency Niacin level 30. Continue niacin replacement  Hypoglycemic episodes Etiology likely  secondary to prolonged n.p.o. status. --D5NS at 42mL/hr --continue to monitor glucose closely  Adult failure to thrive Severe protein calorie malnutrition 2/2 prolonged illness and ileus Body mass index is 17.53 kg/m.  Albumin 1.9.  Oral intake limited to ability to tolerate given nausea/vomiting and C. difficile colitis with ileus.  --Continues n.p.o. per general surgery --PICC line ordered to initiate TPN today  Weakness/debility/deconditioning: Patient with prolonged hospitalization due to ileus and severe C. difficile colitis.  Prior to hospitalization, patient was fairly independent in regards to his ADLs.  His decline during this hospitalization has also been complicated by his prolonged n.p.o. status. --Continue PT/OT efforts while inpatient with plans of SNF discharge when medically stable  Goals of care: --Palliative care following, appreciate assistance    DVT prophylaxis: SCDs   Code Status: DNR Family Communication: Updated patient extensively at bedside, no family present this morning.  Disposition Plan:  Level of care: Telemetry Medical Status is: Inpatient  Remains inpatient appropriate because:Ongoing diagnostic testing needed not appropriate for outpatient work up, Unsafe d/c plan, IV treatments appropriate due to intensity of illness or inability to take PO and Inpatient level of care appropriate due to severity of illness   Dispo:  Patient From: Home  Planned Disposition: [place]  Medically stable for discharge: No     Consultants:   [facility]  Nephrology  Palliative care   Procedures:   EGD  Colonoscopy  Antimicrobials:   Oral vancomycin  Metronidazole   Subjective: Patient seen and examined at bedside, sleeping but easily arousable.  Recently returned from hemodialysis.  Tolerated well.  Continues with diffuse abdominal discomfort, abdominal distention and multiple episodes of nonbloody diarrhea.   Also with weakness/fatigue.  Seen by general surgery this morning with continued recommendations of NPO.  Okay to initiate PICC line for TPN per nephrology.  No family present this morning.  No other complaints or concerns at this time.  Denies headache, no visual changes, no chest pain, no palpitations, no shortness of breath, no fever/chills/night sweats, no nausea, no paresthesias.  No acute events overnight per nursing staff.  Objective: Vitals:   09/04/20 1030 09/04/20 1032 09/04/20 1100 09/04/20 1132  BP: (!) 114/48 (!) 125/48 125/67 125/67  Pulse:    100  Resp: 14 12 16 15   Temp:  (P) 98.2 F (36.8 C) 98.1 F (36.7 C) 98.5 F (36.9 C)  TempSrc:  (P) Oral Oral Oral  SpO2: 100% 100%  96%  Weight:  52.5 kg    Height:        Intake/Output Summary (Last 24 hours) at 09/04/2020 1510 Last data filed at 09/04/2020 1032 Gross per 24 hour  Intake 1527.92 ml  Output 1602 ml  Net -74.08 ml   Filed Weights   09/03/20 0647 09/04/20 0725 09/04/20 1032  Weight: 52.3 kg 54 kg 52.5 kg    Examination:  General exam: Appears calm and comfortable, chronically ill/cachectic/frail in appearance Respiratory system: Clear to auscultation. Respiratory effort normal. Oxygenating well on room air Cardiovascular system: S1 & S2 heard, RRR. No JVD, murmurs, rubs, gallops or clicks. No pedal edema. Gastrointestinal system: Abdomen is distended, with mild diffuse tenderness to palpation. No organomegaly or masses felt. Normal bowel sounds heard. Central nervous system: Alert and oriented. No focal neurological deficits. Extremities: Symmetric 5 x 5 power. Skin: No rashes, lesions or ulcers Psychiatry: Judgement and insight appear poor. Mood & affect appropriate.     Data Reviewed: I have personally reviewed following labs and imaging studies  CBC: Recent Labs  Lab 08/31/20 0203 08/31/20 2010 09/01/20 0144 09/01/20 0651 09/02/20 0343 09/03/20 0053 09/04/20 0124  WBC 11.8*  --  24.3* 23.3*  19.5*  17.0* 15.8*  NEUTROABS 9.6*  --  22.2*  --  18.1* 14.4* 12.8*  HGB 9.6*   < > 8.9* 9.7* 9.1* 7.9* 7.9*  HCT 30.3*   < > 28.4* 30.0* 28.3* 25.2* 24.4*  MCV 85.8  --  86.9 86.5 85.0 85.4 85.0  PLT 270  --  353 330 318 272 295   < > = values in this interval not displayed.   Basic Metabolic Panel: Recent Labs  Lab 08/30/20 0927 09/01/20 0144 09/01/20 0651 09/01/20 2120 09/02/20 0343 09/02/20 0849 09/03/20 0053 09/04/20 0124  NA 137 134* 132*  --  137  --  135 129*  K 4.2 3.6 4.7  --  3.2*  --  3.1* 3.0*  CL 94* 93* 94*  --  96*  --  95* 93*  CO2 24 24 18*  --  27  --  26 23  GLUCOSE 93 136* 108* 90 88 86 84 105*  BUN 48* 40* 44*  --  24*  --  24* 30*  CREATININE 7.40* 6.64* 6.82*  --  4.30*  --  5.17* 6.49*  CALCIUM 8.7* 8.7* 8.7*  --  8.4*  --  8.7* 8.5*  MG  --  2.1  --   --   --   --   --  1.8  PHOS 6.3* 4.7* 5.1*  --   --   --   --  3.5   GFR: Estimated Creatinine Clearance: 7.5 mL/min (A) (by C-G formula based on SCr of 6.49 mg/dL (H)). Liver Function Tests: Recent Labs  Lab 08/30/20 0927 09/01/20 0651 09/02/20 0343 09/03/20 0053 09/04/20 0124  AST  --   --  15 12* 12*  ALT  --   --  9 9 8   ALKPHOS  --   --  97 94 92  BILITOT  --   --  0.8 0.6 0.6  PROT  --   --  5.8* 5.1* 5.1*  ALBUMIN 2.2* 2.2* 2.2* 1.9* 1.9*   No results for input(s): LIPASE, AMYLASE in the last 168 hours. No results for input(s): AMMONIA in the last 168 hours. Coagulation Profile: No results for input(s): INR, PROTIME in the last 168 hours. Cardiac Enzymes: No results for input(s): CKTOTAL, CKMB, CKMBINDEX, TROPONINI in the last 168 hours. BNP (last 3 results) No results for input(s): PROBNP in the last 8760 hours. HbA1C: No results for input(s): HGBA1C in the last 72 hours. CBG: Recent Labs  Lab 09/03/20 2127 09/03/20 2355 09/04/20 0442 09/04/20 0944 09/04/20 1148  GLUCAP 115* 106* 110* 103* 86   Lipid Profile: No results for input(s): CHOL, HDL, LDLCALC, TRIG, CHOLHDL,  LDLDIRECT in the last 72 hours. Thyroid Function Tests: No results for input(s): TSH, T4TOTAL, FREET4, T3FREE, THYROIDAB in the last 72 hours. Anemia Panel: No results for input(s): VITAMINB12, FOLATE, FERRITIN, TIBC, IRON, RETICCTPCT in the last 72 hours. Sepsis Labs: No results for input(s): PROCALCITON, LATICACIDVEN in the last 168 hours.  Recent Results (from the past 240 hour(s))  MRSA PCR Screening     Status: None   Collection Time: 08/26/20  8:05 AM   Specimen: Nasal Mucosa; Nasopharyngeal  Result Value Ref Range Status   MRSA by PCR NEGATIVE NEGATIVE Final    Comment:        The GeneXpert MRSA Assay (FDA approved for NASAL specimens only), is one component of a comprehensive MRSA colonization surveillance program. It is not intended to diagnose MRSA infection nor to guide or monitor treatment for  MRSA infections. Performed at [facility], [place] [address] [place], [place] [address]          Radiology Studies: DG Abd 2 Views  Result Date: 09/04/2020 CLINICAL DATA:  Abdominal distension, abdominal pain, GI bleed, question Clostridium difficile EXAM: ABDOMEN - 2 VIEW COMPARISON:  08/31/2020 FINDINGS: Dilated small bowel loops in the mid abdomen. Some gas and contrast present in the colon. Biopsy clip ascending colon. Minimal rectal gas. No bowel wall thickening or free air. Osseous structures unremarkable. IMPRESSION: Persistent distention of small bowel loops in the mid abdomen, slightly decreased from previous exam, likely representing slightly improved small bowel obstruction. Electronically Signed   By: Lavonia Dana M.D.   On: 09/04/2020 14:39   [place] EKG SITE RITE  Result Date: 09/04/2020 If Site Rite image not attached, placement could not be confirmed due to current cardiac rhythm.  [place] EKG SITE RITE  Result Date: 09/03/2020 If [facility] image not attached, placement could not be confirmed due to current cardiac rhythm.       Scheduled Meds: . sodium  chloride   Intravenous Once  . Chlorhexidine Gluconate Cloth  6 each Topical Q0600  . Chlorhexidine Gluconate Cloth  6 each Topical Q0600  . Chlorhexidine Gluconate Cloth  6 each Topical Q0600  . [START ON 09/08/2020] darbepoetin (ARANESP) injection - DIALYSIS  200 mcg Intravenous Q Sat-HD  . doxercalciferol  7 mcg Intravenous Q T,Th,Sa-HD  . feeding supplement  1 Container Oral TID BM  . insulin aspart  0-6 Units Subcutaneous Q4H  . niacin  50 mg Oral TID WC  . pantoprazole (PROTONIX) IV  40 mg Intravenous Q12H  . saccharomyces boulardii  250 mg Oral BID   Continuous Infusions: . sodium chloride    . sodium chloride    . dextrose 75 mL/hr at 09/04/20 0606  . magnesium sulfate bolus IVPB    . metronidazole 500 mg (09/04/20 1419)  . TPN ADULT (ION)       LOS: 11 days    Time spent: 37 minutes spent on chart review, discussion with nursing staff, consultants, updating family and interview/physical exam; more than 50% of that time was spent in counseling and/or coordination of care.    Mardel Grudzien J [place] DO [facility] Available via Epic secure chat 7am-7pm After these hours, please refer to coverage provider listed on [email or web address] 09/04/2020, 3:10 PM

## 2020-09-05 ENCOUNTER — Encounter (HOSPITAL_COMMUNITY): Payer: Self-pay | Admitting: Internal Medicine

## 2020-09-05 ENCOUNTER — Ambulatory Visit: Payer: Medicare Other | Admitting: Gastroenterology

## 2020-09-05 ENCOUNTER — Inpatient Hospital Stay (HOSPITAL_COMMUNITY): Payer: Medicare Other

## 2020-09-05 DIAGNOSIS — A0472 Enterocolitis due to Clostridium difficile, not specified as recurrent: Secondary | ICD-10-CM | POA: Diagnosis not present

## 2020-09-05 DIAGNOSIS — R140 Abdominal distension (gaseous): Secondary | ICD-10-CM | POA: Diagnosis not present

## 2020-09-05 DIAGNOSIS — E43 Unspecified severe protein-calorie malnutrition: Secondary | ICD-10-CM | POA: Diagnosis present

## 2020-09-05 DIAGNOSIS — K633 Ulcer of intestine: Secondary | ICD-10-CM | POA: Diagnosis not present

## 2020-09-05 DIAGNOSIS — K567 Ileus, unspecified: Secondary | ICD-10-CM | POA: Diagnosis not present

## 2020-09-05 DIAGNOSIS — K559 Vascular disorder of intestine, unspecified: Secondary | ICD-10-CM | POA: Diagnosis not present

## 2020-09-05 LAB — COMPREHENSIVE METABOLIC PANEL
ALT: 9 U/L (ref 0–44)
AST: 12 U/L — ABNORMAL LOW (ref 15–41)
Albumin: 1.9 g/dL — ABNORMAL LOW (ref 3.5–5.0)
Alkaline Phosphatase: 84 U/L (ref 38–126)
Anion gap: 10 (ref 5–15)
BUN: 16 mg/dL (ref 8–23)
CO2: 25 mmol/L (ref 22–32)
Calcium: 8.4 mg/dL — ABNORMAL LOW (ref 8.9–10.3)
Chloride: 101 mmol/L (ref 98–111)
Creatinine, Ser: 4.34 mg/dL — ABNORMAL HIGH (ref 0.61–1.24)
GFR, Estimated: 14 mL/min — ABNORMAL LOW (ref >60)
Glucose, Bld: 93 mg/dL (ref 70–99)
Potassium: 3.2 mmol/L — ABNORMAL LOW (ref 3.5–5.1)
Sodium: 136 mmol/L (ref 135–145)
Total Bilirubin: 0.3 mg/dL (ref 0.3–1.2)
Total Protein: 4.9 g/dL — ABNORMAL LOW (ref 6.5–8.1)

## 2020-09-05 LAB — SURGICAL PATHOLOGY

## 2020-09-05 LAB — CBC WITH DIFFERENTIAL/PLATELET
Abs Immature Granulocytes: 0.58 10*3/uL — ABNORMAL HIGH (ref 0.00–0.07)
Basophils Absolute: 0 10*3/uL (ref 0.0–0.1)
Basophils Relative: 0 %
Eosinophils Absolute: 0.1 10*3/uL (ref 0.0–0.5)
Eosinophils Relative: 0 %
HCT: 24.8 % — ABNORMAL LOW (ref 39.0–52.0)
Hemoglobin: 7.7 g/dL — ABNORMAL LOW (ref 13.0–17.0)
Immature Granulocytes: 3 %
Lymphocytes Relative: 4 %
Lymphs Abs: 0.9 10*3/uL (ref 0.7–4.0)
MCH: 27 pg (ref 26.0–34.0)
MCHC: 31 g/dL (ref 30.0–36.0)
MCV: 87 fL (ref 80.0–100.0)
Monocytes Absolute: 1.8 10*3/uL — ABNORMAL HIGH (ref 0.1–1.0)
Monocytes Relative: 10 %
Neutro Abs: 16 10*3/uL — ABNORMAL HIGH (ref 1.7–7.7)
Neutrophils Relative %: 83 %
Platelets: 296 10*3/uL (ref 150–400)
RBC: 2.85 MIL/uL — ABNORMAL LOW (ref 4.22–5.81)
RDW: 19.9 % — ABNORMAL HIGH (ref 11.5–15.5)
WBC: 19.4 10*3/uL — ABNORMAL HIGH (ref 4.0–10.5)
nRBC: 0.1 % (ref 0.0–0.2)

## 2020-09-05 LAB — C-PEPTIDE: C-Peptide: 7.6 ng/mL — ABNORMAL HIGH (ref 1.1–4.4)

## 2020-09-05 LAB — GLUCOSE, CAPILLARY
Glucose-Capillary: 125 mg/dL — ABNORMAL HIGH (ref 70–99)
Glucose-Capillary: 103 mg/dL — ABNORMAL HIGH (ref 70–99)
Glucose-Capillary: 127 mg/dL — ABNORMAL HIGH (ref 70–99)
Glucose-Capillary: 136 mg/dL — ABNORMAL HIGH (ref 70–99)
Glucose-Capillary: 104 mg/dL — ABNORMAL HIGH (ref 70–99)

## 2020-09-05 LAB — PHOSPHORUS: Phosphorus: 1.7 mg/dL — ABNORMAL LOW (ref 2.5–4.6)

## 2020-09-05 LAB — TRIGLYCERIDES: Triglycerides: 24 mg/dL (ref <150)

## 2020-09-05 LAB — MAGNESIUM: Magnesium: 2.1 mg/dL (ref 1.7–2.4)

## 2020-09-05 LAB — PREALBUMIN: Prealbumin: 8.9 mg/dL — ABNORMAL LOW (ref 18–38)

## 2020-09-05 MED ORDER — TRACE MINERALS CU-MN-SE-ZN 300-55-60-3000 MCG/ML IV SOLN
INTRAVENOUS | Status: AC
  Filled 2020-09-05: qty 676

## 2020-09-05 MED ORDER — CHLORHEXIDINE GLUCONATE CLOTH 2 % EX PADS
6.0000 | MEDICATED_PAD | Freq: Every day | CUTANEOUS | Status: DC
  Administered 2020-09-07: 6 via TOPICAL

## 2020-09-05 MED ORDER — INSULIN ASPART 100 UNIT/ML ~~LOC~~ SOLN: 0.0000 [IU] | Freq: Three times a day (TID) | SUBCUTANEOUS | Status: DC

## 2020-09-05 MED ORDER — POTASSIUM PHOSPHATES 15 MMOLE/5ML IV SOLN
30.0000 mmol | Freq: Once | INTRAVENOUS | Status: AC
  Administered 2020-09-05: 30 mmol via INTRAVENOUS
  Filled 2020-09-05: qty 10

## 2020-09-05 MED ORDER — IOHEXOL 300 MG/ML  SOLN
100.0000 mL | Freq: Once | INTRAMUSCULAR | Status: AC | PRN
  Administered 2020-09-05: 100 mL via INTRAVENOUS

## 2020-09-05 NOTE — Progress Notes (Signed)
PT Cancellation Note  Patient Details Name: Jason CAZAREZ Sr. MRN: [id number] DOB: 26-Oct-1945   Cancelled Treatment:    Reason Eval/Treat Not Completed: (P) Patient at procedure or test/unavailable (Pt at CT imaging dept, hold AM per RN pt having abdominal pain.) Will continue efforts in afternoon per PT POC as schedule permits.   Novi Calia M Enid Maultsby 09/05/2020, 10:30 AM

## 2020-09-05 NOTE — Progress Notes (Signed)
[facility] Progress Note   Subjective:   HD yest- removed 1500- tolerated well.  No major issues overnight-  Still with belly pain- surgery planning to get CT  Objective Vitals:   09/04/20 1132 09/04/20 2140 09/05/20 0408 09/05/20 0757  BP: 125/67 133/65 140/72 (!) 150/71  Pulse: 100 84 90 100  Resp: 15 17 19 20   Temp: 98.5 F (36.9 C) 98.6 F (37 C) 98.6 F (37 C) 98.4 F (36.9 C)  TempSrc: Oral Oral Oral Oral  SpO2: 96% 99% 99% 100%  Weight:      Height:       Physical Exam General: elderly, ill appearing male, very tired- pleasant even though he feels poorly  Heart:+tachycardia, RR, no mrg Lungs:mostly CTAB, breath sounds decreased bilaterally Abdomen: soft, mildly tender throughout Extremities:no LE edema Dialysis Access: LU AVF +bruit  OP HD: [facility]  4h  450/500  58kg  3K/2Ca   P2    L AVF    Heparin none  Mircera 50 (to start 2/22) Venofer 50  Hectorol 7   Assessment/Plan: 1. C. Diff colitis - on po Vanc/Florastor w/ persistent abd pain and diarrhea.CTA abd/pelvis 2/24 suspicious for ileus, no obstruction. NG placed.  Flex sig 2/26 showed severely ulcerated mucosa of the sigmoid colon/ rectum, possibly radiation changes vs ischemia vs infection. Per primary/GI. Now needing to do TPN.  I give permission for them to do PICC line a has a good functioning AVF 2. GI bleed/ ABL anemia:  w/ supra therapeutic INR on admit (during previous admissionEGD2/9with duodenal ulcer, treated w/ clip+ injection). On Protonix.  Hgb improved to 9's s/p 1unit pRBC 2/24. Significant dec in hgb yest-  On low dose darbe-   inc dose for next Saturday  3. ESRD - HD TTS. Next HD 3/3. No heparin via AVF.  K is low-  Can only do 3 K bath-  No 4 k avail -  Supplement PO-  Now starting TPN 4. AFib- on coumadin. Supratherapeutic INR on admission.Reversed with Vit K. Coumadin beingheld. 5. HTN/Volume - BP's normal today.Under dry wt.  Has been getting d5 at 75 per hour--   Sodium low pre HD so challenging a little-  Dec rate of IVF to 50 per hour-  Hopefully will be able to stop now on TPN  6. Anemia CKD --see #1. Hgb9's, now 7's- has had 3u prbc's here total. Continue aranesp - increase dose to 200qwk q Sat 7. MBD CKD --Corr Caok.   Cont home hectorol dosing with HD but holding PO meds  8. COPD  9. H/o prior rectal cancer - in 2014 treated w/ chemoRx,radiation, laparoscopic low anterior resection with diverting loop ileostomy and subsequent ileostomy takedown. 10. GOC - seen by palliative care, pt is now DNR    Louis Meckel  09/01/2020, 10:01 AM   Filed Weights   09/03/20 0647 09/04/20 0725 09/04/20 1032  Weight: 52.3 kg 54 kg 52.5 kg    Intake/Output Summary (Last 24 hours) at 09/05/2020 0940 Last data filed at 09/05/2020 0300 Gross per 24 hour  Intake 1162.19 ml  Output 1500 ml  Net -337.81 ml    Additional Objective Labs: Basic Metabolic Panel: Recent Labs  Lab 09/01/20 0651 09/01/20 2120 09/03/20 0053 09/04/20 0124 09/05/20 0508  NA 132*   < > 135 129* 136  K 4.7   < > 3.1* 3.0* 3.2*  CL 94*   < > 95* 93* 101  CO2 18*   < > 26  23 25  GLUCOSE 108*   < > 84 105* 93  BUN 44*   < > 24* 30* 16  CREATININE 6.82*   < > 5.17* 6.49* 4.34*  CALCIUM 8.7*   < > 8.7* 8.5* 8.4*  PHOS 5.1*  --   --  3.5 1.7*   < > = values in this interval not displayed.   Liver Function Tests: Recent Labs  Lab 09/03/20 0053 09/04/20 0124 09/05/20 0508  AST 12* 12* 12*  ALT 9 8 9   ALKPHOS 94 92 84  BILITOT 0.6 0.6 0.3  PROT 5.1* 5.1* 4.9*  ALBUMIN 1.9* 1.9* 1.9*   CBC: Recent Labs  Lab 09/01/20 0651 09/02/20 0343 09/03/20 0053 09/04/20 0124 09/05/20 0508  WBC 23.3* 19.5* 17.0* 15.8* 19.4*  NEUTROABS  --  18.1* 14.4* 12.8* 16.0*  HGB 9.7* 9.1* 7.9* 7.9* 7.7*  HCT 30.0* 28.3* 25.2* 24.4* 24.8*  MCV 86.5 85.0 85.4 85.0 87.0  PLT 330 318 272 295 296   Blood Culture    Component Value Date/Time   SDES IN/OUT CATH URINE 08/17/2020  1234   SPECREQUEST  08/17/2020 1234    NONE Performed at [facility], [place] [address] [place], [place] [address]    CULT 4,000 COLONIES/mL ENTEROCOCCUS GALLINARUM (A) 08/17/2020 1234   REPTSTATUS 08/21/2020 FINAL 08/17/2020 1234   CBG: Recent Labs  Lab 09/04/20 1148 09/04/20 2147 09/05/20 0144 09/05/20 0414 09/05/20 0758  GLUCAP 86 158* 125* 103* 127*   Medications: . sodium chloride    . sodium chloride    . dextrose 5 % and 0.9% NaCl 75 mL/hr at 09/04/20 1905  . metronidazole 500 mg (09/05/20 0459)  . potassium PHOSPHATE IVPB (in mmol)    . TPN ADULT (ION) 60 mL/hr at 09/04/20 1728   . sodium chloride   Intravenous Once  . Chlorhexidine Gluconate Cloth  6 each Topical Q0600  . Chlorhexidine Gluconate Cloth  6 each Topical Q0600  . Chlorhexidine Gluconate Cloth  6 each Topical Q0600  . [START ON 09/08/2020] darbepoetin (ARANESP) injection - DIALYSIS  200 mcg Intravenous Q Sat-HD  . doxercalciferol  7 mcg Intravenous Q T,Th,Sa-HD  . feeding supplement  1 Container Oral TID BM  . insulin aspart  0-6 Units Subcutaneous Q4H  . niacin  50 mg Oral TID WC  . pantoprazole (PROTONIX) IV  40 mg Intravenous Q12H  . saccharomyces boulardii  250 mg Oral BID  . sodium chloride flush  10-40 mL Intracatheter Q12H

## 2020-09-05 NOTE — Progress Notes (Addendum)
Daily Rounding Note  09/05/2020, 2:27 PM  LOS: 12 days   SUBJECTIVE:   Chief complaint:      still requiring Dilaudid for abd pain.  Pain is diffuse. Some nausea but asking if he can have apple juice which he feels he could tolerate.  However MD has NPO orders in place. 3 loose BM's yesterday per pt (not recorded in flowsheet). 1 stool was loose/brown today.    OBJECTIVE:         Vital signs in last 24 hours:    Temp:  [98.4 F (36.9 C)-98.6 F (37 C)] 98.5 F (36.9 C) (03/02 1113) Pulse Rate:  [84-104] 104 (03/02 1113) Resp:  [17-20] 18 (03/02 1113) BP: (133-159)/(65-82) 159/82 (03/02 1113) SpO2:  [93 %-100 %] 93 % (03/02 1113) Last BM Date: 09/05/20 Filed Weights   09/03/20 0647 09/04/20 0725 09/04/20 1032  Weight: 52.3 kg 54 kg 52.5 kg   General: looks unwell, chronically and acutely ill.     Heart: RRR Chest: no labored breathing or cough.    Abdomen: soft, diffusely tender.  BS present  Extremities: no CCE Neuro/Psych:  Alert, appropriate, moves all 4 limbs.  No tremors.  Fully oriented.    Intake/Output from previous day: 03/01 0701 - 03/02 0700 In: 1162.2 [I.V.:1162.2] Out: 1500   Intake/Output this shift: Total I/O In: 887.3 [I.V.:534.6; IV Piggyback:352.7] Out: -   Lab Results: Recent Labs    09/03/20 0053 09/04/20 0124 09/05/20 0508  WBC 17.0* 15.8* 19.4*  HGB 7.9* 7.9* 7.7*  HCT 25.2* 24.4* 24.8*  PLT 272 295 296   BMET Recent Labs    09/03/20 0053 09/04/20 0124 09/05/20 0508  NA 135 129* 136  K 3.1* 3.0* 3.2*  CL 95* 93* 101  CO2 26 23 25   GLUCOSE 84 105* 93  BUN 24* 30* 16  CREATININE 5.17* 6.49* 4.34*  CALCIUM 8.7* 8.5* 8.4*   LFT Recent Labs    09/03/20 0053 09/04/20 0124 09/05/20 0508  PROT 5.1* 5.1* 4.9*  ALBUMIN 1.9* 1.9* 1.9*  AST 12* 12* 12*  ALT 9 8 9   ALKPHOS 94 92 84  BILITOT 0.6 0.6 0.3    Studies/Results: CT ABDOMEN PELVIS W CONTRAST  Result Date:  09/05/2020 CLINICAL DATA:  Abdominal pain EXAM: CT ABDOMEN AND PELVIS WITH CONTRAST TECHNIQUE: Multidetector CT imaging of the abdomen and pelvis was performed using the standard protocol following bolus administration of intravenous contrast. CONTRAST:  151mL OMNIPAQUE IOHEXOL 300 MG/ML  SOLN COMPARISON:  08/29/2020 FINDINGS: Lower chest: Mild infiltrate is noted in the left lung base. Minimal pleural effusion is noted bilaterally. Hepatobiliary: Dependent density is noted within the gallbladder new from the prior exam likely representing vicarious excretion of contrast or mild gallbladder sludge. Scattered small hypodensities are again seen within the liver likely representing cysts but incompletely evaluated on this exam. Pancreas: Unremarkable. No pancreatic ductal dilatation or surrounding inflammatory changes. Spleen: Normal in size without focal abnormality. Adrenals/Urinary Tract: Adrenal glands are within normal limits. Kidneys demonstrate a normal enhancement pattern with multiple bilateral cysts stable in appearance from the prior exam. Some of these are hyperdense in nature consistent with proteinaceous or hemorrhagic cysts. No obstructive changes are seen. The bladder is partially distended. Stomach/Bowel: Colon is predominately decompressed. Scattered diverticular changes noted without evidence of diverticulitis. The appendix is within normal limits. Stomach is decompressed when compared with the prior exam. No small bowel dilatation is seen. Mild inflammatory change surrounding the  rectum is seen, slightly progressed when compared with the prior exam. Previous anastomosis in the small bowel is again identified and stable. Vascular/Lymphatic: Aortic atherosclerosis. No enlarged abdominal or pelvic lymph nodes. Reproductive: Prostate is unremarkable. Other: No abdominal wall hernia or abnormality. No abdominopelvic ascites. Musculoskeletal: Degenerative changes of lumbar spine are noted. Defect is noted  within the right rectus muscle consistent with prior ileostomy which has been reversed. IMPRESSION: Mild increased perirectal inflammatory change likely representing some focal colitis. Diverticulosis without diverticulitis. Multiple stable cystic lesions within the kidneys. New mild infiltrate in the left lower lobe. Minimal effusions are noted bilaterally. New dependent density within the gallbladder likely representing sludge or vicarious excretion of contrast. Electronically Signed   By: Inez Catalina M.D.   On: 09/05/2020 11:51   DG Abd 2 Views  Result Date: 09/04/2020 CLINICAL DATA:  Abdominal distension, abdominal pain, GI bleed, question Clostridium difficile EXAM: ABDOMEN - 2 VIEW COMPARISON:  08/31/2020 FINDINGS: Dilated small bowel loops in the mid abdomen. Some gas and contrast present in the colon. Biopsy clip ascending colon. Minimal rectal gas. No bowel wall thickening or free air. Osseous structures unremarkable. IMPRESSION: Persistent distention of small bowel loops in the mid abdomen, slightly decreased from previous exam, likely representing slightly improved small bowel obstruction. Electronically Signed   By: Lavonia Dana M.D.   On: 09/04/2020 14:39   [place] EKG SITE RITE  Result Date: 09/04/2020 If Site Rite image not attached, placement could not be confirmed due to current cardiac rhythm.  [place] EKG SITE RITE  Result Date: 09/03/2020 If [facility] image not attached, placement could not be confirmed due to current cardiac rhythm.  Scheduled Meds: . sodium chloride   Intravenous Once  . Chlorhexidine Gluconate Cloth  6 each Topical Q0600  . [START ON 09/08/2020] darbepoetin (ARANESP) injection - DIALYSIS  200 mcg Intravenous Q Sat-HD  . doxercalciferol  7 mcg Intravenous Q T,Th,Sa-HD  . insulin aspart  0-6 Units Subcutaneous Q8H  . niacin  50 mg Oral TID WC  . pantoprazole (PROTONIX) IV  40 mg Intravenous Q12H  . saccharomyces boulardii  250 mg Oral BID  . sodium chloride flush  10-40  mL Intracatheter Q12H   Continuous Infusions: . sodium chloride    . sodium chloride    . dextrose 5 % and 0.9% NaCl Stopped (09/05/20 1108)  . potassium PHOSPHATE IVPB (in mmol) 85 mL/hr at 09/05/20 1247  . TPN ADULT (ION) 60 mL/hr at 09/05/20 1247  . TPN ADULT (ION)     PRN Meds:.sodium chloride, sodium chloride, acetaminophen **OR** acetaminophen, alteplase, heparin, HYDROmorphone (DILAUDID) injection, hyoscyamine, lidocaine (PF), lidocaine-prilocaine, metoprolol tartrate, ondansetron (ZOFRAN) IV, pentafluoroprop-tetrafluoroeth, promethazine, sodium chloride flush  ASSESMENT:   *  Chronic diarrhea.  Superimposed C diff.  Abdominal pain.  ? Ischemic colitis Flex sig 2/26: anal stricture, patchy severe ulceration susp for ischemia vs infection.  Biopsies obtained.    09/05/20 CTAP w contrast: mild perirectal inflammation, aortic atherosclerosis.   Diarrhea improved but abd pain persists.   Oral vanco 2/18 - 2/28.  On going Florastor and IV flagyl.    *   2/26 EGD: Esophagitis, gastritis biopsied, retained gastric fluid Protonix 40 IV bid, NPO in place.    *   [place] anemia.  On aranesp.  Previous Rx for [place] but none now or at time of admission.      *   Malnutrition.  TPN in place.    *   ESRD.  TTS HD.    *  Hypokalemia.      PLAN   *   Still waiting on path from 2/26  *   Dr Carlean Purl stopped IV Flagyl today (day 4-5)   Azucena Freed  09/05/2020, 2:27 PM Phone [phone]     [place] Attending   I have taken an interval history, reviewed the chart and examined the patient. I agree with the Advanced Practitioner's note, impression and recommendations.    I called and spoke to pathology he had a reactive gastropathy in the stomach and the colon biopsies were nonspecific ulcerated inflamed mucosa suspicious for ischemia.  The rectal biopsy was similar but also had some fibrosis.   So it does seem like he had ischemic colitis and then the rectal changes are probably  part ischemia and related to radiation therapy for his rectal cancer I bet.  He had a stricture there.   He continues to be unwell he has diffuse abdominal tenderness.  I looked at his CT scan nothing terrible going on there and it does not correlate with his exam and his tenderness in my mind.  So this remains somewhat of a puzzle.  I do think he has some mild thickening of the small bowel walls though that could be related to an albumin of 1.9 as opposed to inflammation.   I would think he could try some clear liquids if it is okay with surgery tomorrow.  Continue TPN and supportive care.  If felt necessary I could repeat a flex sig I am not sure how it is going to change treatment.  He is off C. difficile treatment at this time.  2-3 loose stools a day he has a chronic diarrhea history.  He is at high risk of recurrence of C. difficile and I wonder about trying to treat with Dificid or whether or not he would be a candidate for a Zinplava infusion though I am not sure we are doing that in the hospital.  I have communicated with Dr. Sylvan Cheese of ID to see what he thinks and may consult  Gatha Mayer, MD, [facility] 09/05/2020 3:55 PM

## 2020-09-05 NOTE — Progress Notes (Signed)
PROGRESS NOTE    Jason IMBERT Sr.  OHY:[id number] DOB: Jan 19, 1946 DOA: 08/24/2020 PCP: [name], MD    Brief Narrative:  Jason Cotta Sr. is a 75 year old gentleman with past medical history significant for chronic atrial fibrillation on Coumadin, peptic ulcer disease, ESRD on HD TTS, history of rectal cancer status post low anterior resection and reversal of ileostomy with chemotherapy/radiation who presented to the ED with abdominal pain/cramping, nausea/vomiting/diarrhea and rectal bleeding. Patient with follow-up with his GI physician due to recent hospitalization from GI bleed due to duodenal ulcer, patient had repeat labs performed which was notably positive for C. difficile colitis. Patient was started on medication for his C. difficile infection outpatient, but he noticed a large amount of blood in his stool which was concerning which prompted further evaluation in the ED.  In the ED, patient was found to have an elevated INR, he was given oral vitamin K and started on Protonix. [facility] was consulted. Hospital service consulted for further evaluation and management of GI bleed with supratherapeutic INR in the setting of C. difficile colitis.   Assessment & Plan:   Active Problems:   Hematochezia   GIB (gastrointestinal bleeding)   C. difficile colitis   Generalized abdominal pain   Non-intractable vomiting   Abdominal distension (gaseous)   Gastritis and gastroduodenitis   Noninfectious gastroenteritis   Colon ulcer   Intestinal occlusion (HCC)   Protein-calorie malnutrition, severe   C. difficile colitis CT angiogram abdomen/pelvis 2/24 suspicious for ileus, no obstruction. Flexible sigmoidoscopy 09/01/2020 with severely ulcerated mucosa at the sigmoid colon/rectum, possibly radiation changes versus ischemia versus infection. --WBC 17.4>30.935.7>29.0>23.3>19.5>17.0>15.8 --Completed course of oral vancomycin --Metronidazole 500 mg IV q12h --Florastor 250 mg p.o.  twice daily --CBC daily --Continues with diffuse abdominal pain and multiple bowel movements daily  Acute on chronic GI bleeding Acute blood loss anemia, POA Supratherapeutic INR, POA Patient reports large bloody bowel movement prior to ED presentation. INR notably elevated at 8.8 on admission. Received oral vitamin K for reversal. Recent hospitalization with finding of duodenal ulcer on EGD. Discharged on Protonix twice daily. EGD on 09/01/2020 with no evidence of active bleeding. Colonoscopy 2/26 with findings consistent with severe radiation changes, proctitis, ischemic vs inflammatory colitis, strictures. --[facility] following, appreciate assistance. --Hgb 8.9>6.8>>8.6>7.0>9.6>8.9>9.7>9.1>7.9 --s/p 2u pRBC 2/19 and 1 u pRBC 2/24 --Aranesp 70mcg every Sat w/ HD --Continue to monitor hemoglobin daily, transfuse for Hgb <7.0 or active bleeding --CBC in am  Nausea/vomiting Ileus Hx rectal cancer s/p LAR, XRT, chemotherapy No gastric outlet obstruction noted on endoscopy. Colonoscopy notable for anal stenosis with rectal stenosis and ulceration from previous surgery/radiation. CT angiogram abdomen/pelvis with occlusion IMA but patent celiac and SMA with collateral filling. --General surgery following, appreciate assistance --CT abdomen/pelvis today with mild increase perirectal inflammatory change consistent with focal colitis, colon decompressed, stomach decompressed, no small bowel dilation --NPO; on TPN  Chronic atrial fibrillation on Coumadin Supratherapeutic INR INR 8.8 admission, reversed with oral vitamin K. --Continue to hold Coumadin --Continue to monitor on telemetry  ESRD on HD TTS --Nephrology following, appreciate assistance --Continue HD inpatient per nephrology  Suspected B3 deficiency Niacin level 30. Continue niacin replacement  Hypoglycemic episodes Etiology likely secondary to prolonged n.p.o. status. --D5NS at 42mL/hr --continue to monitor glucose  closely  Adult failure to thrive Severe protein calorie malnutrition 2/2 prolonged illness and ileus Body mass index is 17.53 kg/m.  Albumin 1.9.  Oral intake limited to ability to tolerate given nausea/vomiting and C. difficile  colitis with ileus.  --Continues n.p.o. per general surgery --PICC line ordered to initiate TPN today  Weakness/debility/deconditioning: Patient with prolonged hospitalization due to ileus and severe C. difficile colitis.  Prior to hospitalization, patient was fairly independent in regards to his ADLs.  His decline during this hospitalization has also been complicated by his prolonged n.p.o. status. --Continue PT/OT efforts while inpatient with plans of SNF discharge when medically stable  Goals of care: --Palliative care following, appreciate assistance    DVT prophylaxis: SCDs   Code Status: DNR Family Communication: Updated spouse present at bedside this morning  Disposition Plan:  Level of care: Telemetry Medical Status is: Inpatient  Remains inpatient appropriate because:Ongoing diagnostic testing needed not appropriate for outpatient work up, Unsafe d/c plan, IV treatments appropriate due to intensity of illness or inability to take PO and Inpatient level of care appropriate due to severity of illness   Dispo:  Patient From: Home  Planned Disposition: [place]  Medically stable for discharge: No     Consultants:   [place] [facility]  Nephrology  Palliative care   Procedures:   EGD  Colonoscopy  Antimicrobials:   Oral vancomycin 2/18 - 2/28  Metronidazole 2/26>>    Subjective: Patient seen and examined at bedside, sleeping but easily arousable.  Continues with significant abdominal discomfort and multiple bowel movements.  Spouse present at bedside.  Discussed with spouse extensively this morning about his lack of significant recovery with continued/persistent abdominal pain with multiple bowel  movements.  Discussed will need to further consider with family regarding goals of care; whether to continue aggressive measures which may require surgical intervention versus to consider a more palliative/comfort approach.  She would like to discuss more with her 2 sons.  Patient with no other complaints or concerns at this time.  Denies headache, no visual changes, no chest pain, no palpitations, no shortness of breath, no fever/chills/night sweats, no nausea, no paresthesias.  No acute events overnight per nursing staff.  Objective: Vitals:   09/04/20 2140 09/05/20 0408 09/05/20 0757 09/05/20 1113  BP: 133/65 140/72 (!) 150/71 (!) 159/82  Pulse: 84 90 100 (!) 104  Resp: 17 19 20 18   Temp: 98.6 F (37 C) 98.6 F (37 C) 98.4 F (36.9 C) 98.5 F (36.9 C)  TempSrc: Oral Oral Oral Oral  SpO2: 99% 99% 100% 93%  Weight:      Height:        Intake/Output Summary (Last 24 hours) at 09/05/2020 1300 Last data filed at 09/05/2020 1247 Gross per 24 hour  Intake 2049.48 ml  Output --  Net 2049.48 ml   Filed Weights   09/03/20 0647 09/04/20 0725 09/04/20 1032  Weight: 52.3 kg 54 kg 52.5 kg    Examination:  General exam: Appears calm and comfortable, chronically ill/cachectic/frail in appearance Respiratory system: Clear to auscultation. Respiratory effort normal. Oxygenating well on room air Cardiovascular system: S1 & S2 heard, RRR. No JVD, murmurs, rubs, gallops or clicks. No pedal edema. Gastrointestinal system: Abdomen is distended, with mild diffuse tenderness to palpation. No organomegaly or masses felt. Normal bowel sounds heard. Central nervous system: Alert and oriented. No focal neurological deficits. Extremities: Symmetric 5 x 5 power. Skin: No rashes, lesions or ulcers Psychiatry: Judgement and insight appear poor. Mood & affect appropriate.     Data Reviewed: I have personally reviewed following labs and imaging studies  CBC: Recent Labs  Lab 09/01/20 0144 09/01/20 0651  09/02/20 0343 09/03/20 0053 09/04/20 0124 09/05/20  0508  WBC 24.3* 23.3* 19.5* 17.0* 15.8* 19.4*  NEUTROABS 22.2*  --  18.1* 14.4* 12.8* 16.0*  HGB 8.9* 9.7* 9.1* 7.9* 7.9* 7.7*  HCT 28.4* 30.0* 28.3* 25.2* 24.4* 24.8*  MCV 86.9 86.5 85.0 85.4 85.0 87.0  PLT 353 330 318 272 295 568   Basic Metabolic Panel: Recent Labs  Lab 08/30/20 0927 09/01/20 0144 09/01/20 0651 09/01/20 2120 09/02/20 0343 09/02/20 0849 09/03/20 0053 09/04/20 0124 09/05/20 0508  NA 137 134* 132*  --  137  --  135 129* 136  K 4.2 3.6 4.7  --  3.2*  --  3.1* 3.0* 3.2*  CL 94* 93* 94*  --  96*  --  95* 93* 101  CO2 24 24 18*  --  27  --  26 23 25   GLUCOSE 93 136* 108*   < > 88 86 84 105* 93  BUN 48* 40* 44*  --  24*  --  24* 30* 16  CREATININE 7.40* 6.64* 6.82*  --  4.30*  --  5.17* 6.49* 4.34*  CALCIUM 8.7* 8.7* 8.7*  --  8.4*  --  8.7* 8.5* 8.4*  MG  --  2.1  --   --   --   --   --  1.8 2.1  PHOS 6.3* 4.7* 5.1*  --   --   --   --  3.5 1.7*   < > = values in this interval not displayed.   GFR: Estimated Creatinine Clearance: 11.3 mL/min (A) (by C-G formula based on SCr of 4.34 mg/dL (H)). Liver Function Tests: Recent Labs  Lab 09/01/20 0651 09/02/20 0343 09/03/20 0053 09/04/20 0124 09/05/20 0508  AST  --  15 12* 12* 12*  ALT  --  9 9 8 9   ALKPHOS  --  97 94 92 84  BILITOT  --  0.8 0.6 0.6 0.3  PROT  --  5.8* 5.1* 5.1* 4.9*  ALBUMIN 2.2* 2.2* 1.9* 1.9* 1.9*   No results for input(s): LIPASE, AMYLASE in the last 168 hours. No results for input(s): AMMONIA in the last 168 hours. Coagulation Profile: No results for input(s): INR, PROTIME in the last 168 hours. Cardiac Enzymes: No results for input(s): CKTOTAL, CKMB, CKMBINDEX, TROPONINI in the last 168 hours. BNP (last 3 results) No results for input(s): PROBNP in the last 8760 hours. HbA1C: No results for input(s): HGBA1C in the last 72 hours. CBG: Recent Labs  Lab 09/04/20 2147 09/05/20 0144 09/05/20 0414 09/05/20 0758 09/05/20 1219   GLUCAP 158* 125* 103* 127* 136*   Lipid Profile: Recent Labs    09/05/20 0508  TRIG 24   Thyroid Function Tests: No results for input(s): TSH, T4TOTAL, FREET4, T3FREE, THYROIDAB in the last 72 hours. Anemia Panel: No results for input(s): VITAMINB12, FOLATE, FERRITIN, TIBC, IRON, RETICCTPCT in the last 72 hours. Sepsis Labs: No results for input(s): PROCALCITON, LATICACIDVEN in the last 168 hours.  No results found for this or any previous visit (from the past 240 hour(s)).       Radiology Studies: CT ABDOMEN PELVIS W CONTRAST  Result Date: 09/05/2020 CLINICAL DATA:  Abdominal pain EXAM: CT ABDOMEN AND PELVIS WITH CONTRAST TECHNIQUE: Multidetector CT imaging of the abdomen and pelvis was performed using the standard protocol following bolus administration of intravenous contrast. CONTRAST:  152mL OMNIPAQUE IOHEXOL 300 MG/ML  SOLN COMPARISON:  08/29/2020 FINDINGS: Lower chest: Mild infiltrate is noted in the left lung base. Minimal pleural effusion is noted bilaterally. Hepatobiliary: Dependent density is noted  within the gallbladder new from the prior exam likely representing vicarious excretion of contrast or mild gallbladder sludge. Scattered small hypodensities are again seen within the liver likely representing cysts but incompletely evaluated on this exam. Pancreas: Unremarkable. No pancreatic ductal dilatation or surrounding inflammatory changes. Spleen: Normal in size without focal abnormality. Adrenals/Urinary Tract: Adrenal glands are within normal limits. Kidneys demonstrate a normal enhancement pattern with multiple bilateral cysts stable in appearance from the prior exam. Some of these are hyperdense in nature consistent with proteinaceous or hemorrhagic cysts. No obstructive changes are seen. The bladder is partially distended. Stomach/Bowel: Colon is predominately decompressed. Scattered diverticular changes noted without evidence of diverticulitis. The appendix is within  normal limits. Stomach is decompressed when compared with the prior exam. No small bowel dilatation is seen. Mild inflammatory change surrounding the rectum is seen, slightly progressed when compared with the prior exam. Previous anastomosis in the small bowel is again identified and stable. Vascular/Lymphatic: Aortic atherosclerosis. No enlarged abdominal or pelvic lymph nodes. Reproductive: Prostate is unremarkable. Other: No abdominal wall hernia or abnormality. No abdominopelvic ascites. Musculoskeletal: Degenerative changes of lumbar spine are noted. Defect is noted within the right rectus muscle consistent with prior ileostomy which has been reversed. IMPRESSION: Mild increased perirectal inflammatory change likely representing some focal colitis. Diverticulosis without diverticulitis. Multiple stable cystic lesions within the kidneys. New mild infiltrate in the left lower lobe. Minimal effusions are noted bilaterally. New dependent density within the gallbladder likely representing sludge or vicarious excretion of contrast. Electronically Signed   By: Inez Catalina M.D.   On: 09/05/2020 11:51   DG Abd 2 Views  Result Date: 09/04/2020 CLINICAL DATA:  Abdominal distension, abdominal pain, GI bleed, question Clostridium difficile EXAM: ABDOMEN - 2 VIEW COMPARISON:  08/31/2020 FINDINGS: Dilated small bowel loops in the mid abdomen. Some gas and contrast present in the colon. Biopsy clip ascending colon. Minimal rectal gas. No bowel wall thickening or free air. Osseous structures unremarkable. IMPRESSION: Persistent distention of small bowel loops in the mid abdomen, slightly decreased from previous exam, likely representing slightly improved small bowel obstruction. Electronically Signed   By: Lavonia Dana M.D.   On: 09/04/2020 14:39   [place] EKG SITE RITE  Result Date: 09/04/2020 If Site Rite image not attached, placement could not be confirmed due to current cardiac rhythm.  [place] EKG SITE RITE  Result Date:  09/03/2020 If [facility] image not attached, placement could not be confirmed due to current cardiac rhythm.       Scheduled Meds: . sodium chloride   Intravenous Once  . Chlorhexidine Gluconate Cloth  6 each Topical Q0600  . [START ON 09/08/2020] darbepoetin (ARANESP) injection - DIALYSIS  200 mcg Intravenous Q Sat-HD  . doxercalciferol  7 mcg Intravenous Q T,Th,Sa-HD  . insulin aspart  0-6 Units Subcutaneous Q8H  . niacin  50 mg Oral TID WC  . pantoprazole (PROTONIX) IV  40 mg Intravenous Q12H  . saccharomyces boulardii  250 mg Oral BID  . sodium chloride flush  10-40 mL Intracatheter Q12H   Continuous Infusions: . sodium chloride    . sodium chloride    . dextrose 5 % and 0.9% NaCl Stopped (09/05/20 1108)  . metronidazole Stopped (09/05/20 0601)  . potassium PHOSPHATE IVPB (in mmol) 85 mL/hr at 09/05/20 1247  . TPN ADULT (ION) 60 mL/hr at 09/05/20 1247  . TPN ADULT (ION)       LOS: 12 days    Time spent: 39 minutes spent on  chart review, discussion with nursing staff, consultants, updating family and interview/physical exam; more than 50% of that time was spent in counseling and/or coordination of care.    Vale Peraza J [place] DO [facility] Available via Epic secure chat 7am-7pm After these hours, please refer to coverage provider listed on [email or web address] 09/05/2020, 1:00 PM

## 2020-09-05 NOTE — Progress Notes (Signed)
PHARMACY - TOTAL PARENTERAL NUTRITION CONSULT NOTE   Indication: Prolonged ileus in the setting of ischemic colitis  Patient Measurements: Height: 5\' 8"  (172.7 cm) Weight: 52.5 kg (115 lb 11.9 oz) IBW/kg (Calculated) : 68.4   Body mass index is 17.6 kg/m. Usual Weight: ~63kg  Assessment:  [name] with hx rectal cancer s/p radiation/chemo, anteriro resection and ileostomy reversal, afib on warfarin with admit INR 8.8 now s/p reversal, ESRD on HD TTS, PUD with recent GIB, anemia of chronic disease, CVA, COPD, gout, and HTN admitted with melena, C.diff colitis, prolonged ileus, malnutrition with poor PO intake >11 days and hypoglycemia requiring D10 infusion. Patient lost ~15% of body weight in one month. Failed attempt to place NGT 2/26. PICC placement ok'd by nephrology. Pharmacy consulted for TPN.   Glucose / Insulin: BG <120, A1C 5.5, 0 insulin used  Electrolytes: Na 136, K 3.2 and Phos 1.7 > give Kphos 30 mmol x1 (got 40 of K), CoCa 10; Mg 2.1 after 2g IV x1 Renal: HD TTS  Hepatic: LFTs low-normal, albumin 1.9, prealbumin 8.9, TG 24 Intake / Output; MIVF: D5 NS @50ml /hr, 1.2L in; HD net 1.5L out; BM x2-3 per patient GI Imaging: 2/18 CT Abd- Wall thickening of the sigmoid colon and rectum, consistent with acute colitis/proctitis 2/24 CT Abd with IMA occlusion but celiac and SMA patent; no evidence of acute ischemia. Increased distension of the stomach, small bowel and colon since 08/24/2020. 2/25 DG abd- no NGT, Gastric and small bowel dilatation GI Surgeries / Procedures:  2/25 EGD- Mildly severe esophagitis with a small 2-3 mm ulcer with no bleeding; Retained fluid and small amounts of solid debris in stomach; attempted NGT multiple times but unable to advance  2/26 Flex sig -severely ulcerated mucosa of the sigmoid colon/ rectum, possibly radiation changes vs ischemia vs infection Central access: 3/1 TPN start date: 3/1   Nutritional Goals (RD recommendations 3/2) kCal:  1900-2100/d, Protein: 100-115g, Fluid: 1L + UOP  Goal TPN rate is 65 mL/hr (provides 101g of protein and avg of 1945kcals per day)  Current Nutrition:  TPN, NPO  Plan:  Increase TPN to goal 32mL/hr at 1800 -provides 101g of protein and avg of 1945kcals per day, meeting 100% of estimated needs) 50g lipids on MWF only due to national shortage Electrolytes in TPN: gently Increase K to 10 mEq/L and Phos to 6 mmol/L given ESRD; Continue Na 47mEq/L, Ca 71mEq/L, Mg 71mEq/L. Cl:Ac 1:1 Add standard MVI and trace elements to TPN, remove chromium for ESRD Decrease very Sensitive SSI to Q8h with TPN and adjust as needed  KPhos 30 mmol (45 mEq of K) x1  Monitor TPN labs daily until stable then on Mon/Thurs Additional IVF per team   Benetta Spar, PharmD, BCPS, [place] Pharmacist  Please check AMION for all [place] phone numbers After 10:00 PM, call [place]

## 2020-09-05 NOTE — Progress Notes (Incomplete)
PROGRESS NOTE    Jason LEMMONS Sr.  IHK:[id number] DOB: October 03, 1945 DOA: 08/24/2020 PCP: [name], MD    Brief Narrative:  Jason Cotta Sr. is a 75 year old gentleman with past medical history significant for chronic atrial fibrillation on Coumadin, peptic ulcer disease, ESRD on HD TTS, history of rectal cancer status post low anterior resection and reversal of ileostomy with chemotherapy/radiation who presented to the ED with abdominal pain/cramping, nausea/vomiting/diarrhea and rectal bleeding. Patient with follow-up with his GI physician due to recent hospitalization from GI bleed due to duodenal ulcer, patient had repeat labs performed which was notably positive for C. difficile colitis. Patient was started on medication for his C. difficile infection outpatient, but he noticed a large amount of blood in his stool which was concerning which prompted further evaluation in the ED.  In the ED, patient was found to have an elevated INR, he was given oral vitamin K and started on Protonix. [facility] was consulted. Hospital service consulted for further evaluation and management of GI bleed with supratherapeutic INR in the setting of C. difficile colitis.   Assessment & Plan:   Active Problems:   Hematochezia   GIB (gastrointestinal bleeding)   C. difficile colitis   Generalized abdominal pain   Non-intractable vomiting   Abdominal distension (gaseous)   Gastritis and gastroduodenitis   Noninfectious gastroenteritis   Colon ulcer   Intestinal occlusion (HCC)   Protein-calorie malnutrition, severe   C. difficile colitis CT angiogram abdomen/pelvis 2/24 suspicious for ileus, no obstruction. Flexible sigmoidoscopy 09/01/2020 with severely ulcerated mucosa at the sigmoid colon/rectum, possibly radiation changes versus ischemia versus infection. --WBC 17.4>30.935.7>29.0>23.3>19.5>17.0>15.8 --Completed course of oral vancomycin --Metronidazole 500 mg IV q12h --Florastor 250 mg p.o.  twice daily --CBC daily --Continues with diffuse abdominal pain and multiple bowel movements daily  Acute on chronic GI bleeding Acute blood loss anemia, POA Supratherapeutic INR, POA Patient reports large bloody bowel movement prior to ED presentation. INR notably elevated at 8.8 on admission. Received oral vitamin K for reversal. Recent hospitalization with finding of duodenal ulcer on EGD. Discharged on Protonix twice daily. EGD on 09/01/2020 with no evidence of active bleeding. Colonoscopy 2/26 with findings consistent with severe radiation changes, proctitis, ischemic vs inflammatory colitis, strictures. -- GI following, appreciate assistance. --Hgb 8.9>6.8>>8.6>7.0>9.6>8.9>9.7>9.1>7.9 --s/p 2u pRBC 2/19 and 1 u pRBC 2/24 --Aranesp 101mcg every Sat w/ HD --Continue to monitor hemoglobin daily, transfuse for Hgb <7.0 or active bleeding --CBC in am  Nausea/vomiting Ileus Hx rectal cancer s/p LAR, XRT, chemotherapy No gastric outlet obstruction noted on endoscopy. Colonoscopy notable for anal stenosis with rectal stenosis and ulceration from previous surgery/radiation. CT angiogram abdomen/pelvis with occlusion IMA but patent celiac and SMA with collateral filling. --General surgery following, appreciate assistance --CT abdomen/pelvis today with mild increase perirectal inflammatory change consistent with focal colitis, colon decompressed, stomach decompressed, no small bowel dilation --NPO; on TPN  Chronic atrial fibrillation on Coumadin Supratherapeutic INR INR 8.8 admission, reversed with oral vitamin K. --Continue to hold Coumadin --Continue to monitor on telemetry  ESRD on HD TTS --Nephrology following, appreciate assistance --Continue HD inpatient per nephrology  Suspected B3 deficiency Niacin level 30. Continue niacin replacement  Hypoglycemic episodes Etiology likely secondary to prolonged n.p.o. status. --D5NS at 48mL/hr --continue to monitor glucose  closely  Adult failure to thrive Severe protein calorie malnutrition 2/2 prolonged illness and ileus Body mass index is 17.53 kg/m.  Albumin 1.9.  Oral intake limited to ability to tolerate given nausea/vomiting and C. difficile  colitis with ileus.  --Continues n.p.o. per general surgery --PICC line ordered to initiate TPN today  Weakness/debility/deconditioning: Patient with prolonged hospitalization due to ileus and severe C. difficile colitis.  Prior to hospitalization, patient was fairly independent in regards to his ADLs.  His decline during this hospitalization has also been complicated by his prolonged n.p.o. status. --Continue PT/OT efforts while inpatient with plans of SNF discharge when medically stable  Goals of care: --Palliative care following, appreciate assistance    DVT prophylaxis: SCDs   Code Status: DNR Family Communication: Updated spouse present at bedside this morning  Disposition Plan:  Level of care: Telemetry Medical Status is: Inpatient  Remains inpatient appropriate because:Ongoing diagnostic testing needed not appropriate for outpatient work up, Unsafe d/c plan, IV treatments appropriate due to intensity of illness or inability to take PO and Inpatient level of care appropriate due to severity of illness   Dispo:  Patient From: Home  Planned Disposition: [place]  Medically stable for discharge: No     Consultants:   [place] [facility]  Nephrology  Palliative care   Procedures:   EGD  Colonoscopy  Antimicrobials:   Oral vancomycin 2/18 - 2/28  Metronidazole 2/26>>    Subjective: Patient seen and examined at bedside, sleeping but easily arousable.  Continues with significant abdominal discomfort and multiple bowel movements.  Spouse present at bedside.  Discussed with spouse extensively this morning about his lack of significant recovery with continued  Recently returned from hemodialysis.  Tolerated  well.  Continues with diffuse abdominal discomfort, abdominal distention and multiple episodes of nonbloody diarrhea.  Also with weakness/fatigue.  Seen by general surgery this morning with continued recommendations of NPO.  Okay to initiate PICC line for TPN per nephrology.  No family present this morning.  No other complaints or concerns at this time.  Denies headache, no visual changes, no chest pain, no palpitations, no shortness of breath, no fever/chills/night sweats, no nausea, no paresthesias.  No acute events overnight per nursing staff.  Objective: Vitals:   09/04/20 2140 09/05/20 0408 09/05/20 0757 09/05/20 1113  BP: 133/65 140/72 (!) 150/71 (!) 159/82  Pulse: 84 90 100 (!) 104  Resp: 17 19 20 18   Temp: 98.6 F (37 C) 98.6 F (37 C) 98.4 F (36.9 C) 98.5 F (36.9 C)  TempSrc: Oral Oral Oral Oral  SpO2: 99% 99% 100% 93%  Weight:      Height:        Intake/Output Summary (Last 24 hours) at 09/05/2020 1300 Last data filed at 09/05/2020 1247 Gross per 24 hour  Intake 2049.48 ml  Output -  Net 2049.48 ml   Filed Weights   09/03/20 0647 09/04/20 0725 09/04/20 1032  Weight: 52.3 kg 54 kg 52.5 kg    Examination:  General exam: Appears calm and comfortable, chronically ill/cachectic/frail in appearance Respiratory system: Clear to auscultation. Respiratory effort normal. Oxygenating well on room air Cardiovascular system: S1 & S2 heard, RRR. No JVD, murmurs, rubs, gallops or clicks. No pedal edema. Gastrointestinal system: Abdomen is distended, with mild diffuse tenderness to palpation. No organomegaly or masses felt. Normal bowel sounds heard. Central nervous system: Alert and oriented. No focal neurological deficits. Extremities: Symmetric 5 x 5 power. Skin: No rashes, lesions or ulcers Psychiatry: Judgement and insight appear poor. Mood & affect appropriate.     Data Reviewed: I have personally reviewed following labs and imaging studies  CBC: Recent Labs  Lab  09/01/20 0144 09/01/20 7824 09/02/20 0343  09/03/20 0053 09/04/20 0124 09/05/20 0508  WBC 24.3* 23.3* 19.5* 17.0* 15.8* 19.4*  NEUTROABS 22.2*  --  18.1* 14.4* 12.8* 16.0*  HGB 8.9* 9.7* 9.1* 7.9* 7.9* 7.7*  HCT 28.4* 30.0* 28.3* 25.2* 24.4* 24.8*  MCV 86.9 86.5 85.0 85.4 85.0 87.0  PLT 353 330 318 272 295 259   Basic Metabolic Panel: Recent Labs  Lab 08/30/20 0927 09/01/20 0144 09/01/20 0651 09/01/20 2120 09/02/20 0343 09/02/20 0849 09/03/20 0053 09/04/20 0124 09/05/20 0508  NA 137 134* 132*  --  137  --  135 129* 136  K 4.2 3.6 4.7  --  3.2*  --  3.1* 3.0* 3.2*  CL 94* 93* 94*  --  96*  --  95* 93* 101  CO2 24 24 18*  --  27  --  26 23 25   GLUCOSE 93 136* 108*   < > 88 86 84 105* 93  BUN 48* 40* 44*  --  24*  --  24* 30* 16  CREATININE 7.40* 6.64* 6.82*  --  4.30*  --  5.17* 6.49* 4.34*  CALCIUM 8.7* 8.7* 8.7*  --  8.4*  --  8.7* 8.5* 8.4*  MG  --  2.1  --   --   --   --   --  1.8 2.1  PHOS 6.3* 4.7* 5.1*  --   --   --   --  3.5 1.7*   < > = values in this interval not displayed.   GFR: Estimated Creatinine Clearance: 11.3 mL/min (A) (by C-G formula based on SCr of 4.34 mg/dL (H)). Liver Function Tests: Recent Labs  Lab 09/01/20 0651 09/02/20 0343 09/03/20 0053 09/04/20 0124 09/05/20 0508  AST  --  15 12* 12* 12*  ALT  --  9 9 8 9   ALKPHOS  --  97 94 92 84  BILITOT  --  0.8 0.6 0.6 0.3  PROT  --  5.8* 5.1* 5.1* 4.9*  ALBUMIN 2.2* 2.2* 1.9* 1.9* 1.9*   No results for input(s): LIPASE, AMYLASE in the last 168 hours. No results for input(s): AMMONIA in the last 168 hours. Coagulation Profile: No results for input(s): INR, PROTIME in the last 168 hours. Cardiac Enzymes: No results for input(s): CKTOTAL, CKMB, CKMBINDEX, TROPONINI in the last 168 hours. BNP (last 3 results) No results for input(s): PROBNP in the last 8760 hours. HbA1C: No results for input(s): HGBA1C in the last 72 hours. CBG: Recent Labs  Lab 09/04/20 2147 09/05/20 0144 09/05/20 0414  09/05/20 0758 09/05/20 1219  GLUCAP 158* 125* 103* 127* 136*   Lipid Profile: Recent Labs    09/05/20 0508  TRIG 24   Thyroid Function Tests: No results for input(s): TSH, T4TOTAL, FREET4, T3FREE, THYROIDAB in the last 72 hours. Anemia Panel: No results for input(s): VITAMINB12, FOLATE, FERRITIN, TIBC, IRON, RETICCTPCT in the last 72 hours. Sepsis Labs: No results for input(s): PROCALCITON, LATICACIDVEN in the last 168 hours.  No results found for this or any previous visit (from the past 240 hour(s)).       Radiology Studies: CT ABDOMEN PELVIS W CONTRAST  Result Date: 09/05/2020 CLINICAL DATA:  Abdominal pain EXAM: CT ABDOMEN AND PELVIS WITH CONTRAST TECHNIQUE: Multidetector CT imaging of the abdomen and pelvis was performed using the standard protocol following bolus administration of intravenous contrast. CONTRAST:  116mL OMNIPAQUE IOHEXOL 300 MG/ML  SOLN COMPARISON:  08/29/2020 FINDINGS: Lower chest: Mild infiltrate is noted in the left lung base. Minimal pleural effusion is noted bilaterally.  Hepatobiliary: Dependent density is noted within the gallbladder new from the prior exam likely representing vicarious excretion of contrast or mild gallbladder sludge. Scattered small hypodensities are again seen within the liver likely representing cysts but incompletely evaluated on this exam. Pancreas: Unremarkable. No pancreatic ductal dilatation or surrounding inflammatory changes. Spleen: Normal in size without focal abnormality. Adrenals/Urinary Tract: Adrenal glands are within normal limits. Kidneys demonstrate a normal enhancement pattern with multiple bilateral cysts stable in appearance from the prior exam. Some of these are hyperdense in nature consistent with proteinaceous or hemorrhagic cysts. No obstructive changes are seen. The bladder is partially distended. Stomach/Bowel: Colon is predominately decompressed. Scattered diverticular changes noted without evidence of  diverticulitis. The appendix is within normal limits. Stomach is decompressed when compared with the prior exam. No small bowel dilatation is seen. Mild inflammatory change surrounding the rectum is seen, slightly progressed when compared with the prior exam. Previous anastomosis in the small bowel is again identified and stable. Vascular/Lymphatic: Aortic atherosclerosis. No enlarged abdominal or pelvic lymph nodes. Reproductive: Prostate is unremarkable. Other: No abdominal wall hernia or abnormality. No abdominopelvic ascites. Musculoskeletal: Degenerative changes of lumbar spine are noted. Defect is noted within the right rectus muscle consistent with prior ileostomy which has been reversed. IMPRESSION: Mild increased perirectal inflammatory change likely representing some focal colitis. Diverticulosis without diverticulitis. Multiple stable cystic lesions within the kidneys. New mild infiltrate in the left lower lobe. Minimal effusions are noted bilaterally. New dependent density within the gallbladder likely representing sludge or vicarious excretion of contrast. Electronically Signed   By: Inez Catalina M.D.   On: 09/05/2020 11:51   DG Abd 2 Views  Result Date: 09/04/2020 CLINICAL DATA:  Abdominal distension, abdominal pain, GI bleed, question Clostridium difficile EXAM: ABDOMEN - 2 VIEW COMPARISON:  08/31/2020 FINDINGS: Dilated small bowel loops in the mid abdomen. Some gas and contrast present in the colon. Biopsy clip ascending colon. Minimal rectal gas. No bowel wall thickening or free air. Osseous structures unremarkable. IMPRESSION: Persistent distention of small bowel loops in the mid abdomen, slightly decreased from previous exam, likely representing slightly improved small bowel obstruction. Electronically Signed   By: Lavonia Dana M.D.   On: 09/04/2020 14:39   [place] EKG SITE RITE  Result Date: 09/04/2020 If Site Rite image not attached, placement could not be confirmed due to current cardiac  rhythm.  [place] EKG SITE RITE  Result Date: 09/03/2020 If [facility] image not attached, placement could not be confirmed due to current cardiac rhythm.       Scheduled Meds: . sodium chloride   Intravenous Once  . Chlorhexidine Gluconate Cloth  6 each Topical Q0600  . [START ON 09/08/2020] darbepoetin (ARANESP) injection - DIALYSIS  200 mcg Intravenous Q Sat-HD  . doxercalciferol  7 mcg Intravenous Q T,Th,Sa-HD  . insulin aspart  0-6 Units Subcutaneous Q8H  . niacin  50 mg Oral TID WC  . pantoprazole (PROTONIX) IV  40 mg Intravenous Q12H  . saccharomyces boulardii  250 mg Oral BID  . sodium chloride flush  10-40 mL Intracatheter Q12H   Continuous Infusions: . sodium chloride    . sodium chloride    . dextrose 5 % and 0.9% NaCl Stopped (09/05/20 1108)  . metronidazole Stopped (09/05/20 0601)  . potassium PHOSPHATE IVPB (in mmol) 85 mL/hr at 09/05/20 1247  . TPN ADULT (ION) 60 mL/hr at 09/05/20 1247  . TPN ADULT (ION)       LOS: 12 days    Time  spent: 37 minutes spent on chart review, discussion with nursing staff, consultants, updating family and interview/physical exam; more than 50% of that time was spent in counseling and/or coordination of care.    Eric J [place] DO [facility] Available via Epic secure chat 7am-7pm After these hours, please refer to coverage provider listed on [email or web address] 09/05/2020, 1:00 PM

## 2020-09-05 NOTE — Progress Notes (Signed)
Progress Note  4 Days Post-Op  Subjective: Patient reports persistent abdominal pain that is maybe a little better or the same compared to yesterday. Reports nausea is slightly worsened. Had 2-3 BM yesterday.   Objective: Vital signs in last 24 hours: Temp:  [98.1 F (36.7 C)-98.6 F (37 C)] 98.4 F (36.9 C) (03/02 0757) Pulse Rate:  [51-110] 100 (03/02 0757) Resp:  [12-20] 20 (03/02 0757) BP: (106-150)/(42-72) 150/71 (03/02 0757) SpO2:  [96 %-100 %] 100 % (03/02 0757) Weight:  [52.5 kg] 52.5 kg (03/01 1032) Last BM Date: 09/05/20  Intake/Output from previous day: 03/01 0701 - 03/02 0700 In: 1162.2 [I.V.:1162.2] Out: 1500  Intake/Output this shift: No intake/output data recorded.  PE: General: pleasant, WD, cachectic male who is laying in bed in NAD Heart: regular, rate, and rhythm.   Lungs: CTAB, no wheezes, rhonchi, or rales noted.  Respiratory effort nonlabored Abd: soft, mild generalized ttp without peritonitis or guarding, ND, +BS, no masses, hernias, or organomegaly MS: all 4 extremities are symmetrical with no cyanosis, clubbing, or edema. Skin: warm and dry with no masses, lesions, or rashes Psych: A&Ox3 with an appropriate affect.   Lab Results:  Recent Labs    09/04/20 0124 09/05/20 0508  WBC 15.8* 19.4*  HGB 7.9* 7.7*  HCT 24.4* 24.8*  PLT 295 296   BMET Recent Labs    09/04/20 0124 09/05/20 0508  NA 129* 136  K 3.0* 3.2*  CL 93* 101  CO2 23 25  GLUCOSE 105* 93  BUN 30* 16  CREATININE 6.49* 4.34*  CALCIUM 8.5* 8.4*   PT/INR No results for input(s): LABPROT, INR in the last 72 hours. CMP     Component Value Date/Time   NA 136 09/05/2020 0508   NA 140 02/24/2017 0000   NA 138 02/20/2014 1432   K 3.2 (L) 09/05/2020 0508   K 3.5 02/20/2014 1432   CL 101 09/05/2020 0508   CL 101 12/09/2012 1048   CO2 25 09/05/2020 0508   CO2 30 (H) 02/20/2014 1432   GLUCOSE 93 09/05/2020 0508   GLUCOSE 93 02/20/2014 1432   GLUCOSE 96 12/09/2012  1048   BUN 16 09/05/2020 0508   BUN 50 (A) 08/10/2017 0000   BUN 36.0 (H) 02/20/2014 1432   CREATININE 4.34 (H) 09/05/2020 0508   CREATININE 2.4 (H) 02/20/2014 1432   CALCIUM 8.4 (L) 09/05/2020 0508   CALCIUM 10.4 02/20/2014 1432   PROT 4.9 (L) 09/05/2020 0508   PROT 7.2 02/20/2014 1432   ALBUMIN 1.9 (L) 09/05/2020 0508   ALBUMIN 3.8 02/20/2014 1432   AST 12 (L) 09/05/2020 0508   AST 22 02/20/2014 1432   ALT 9 09/05/2020 0508   ALT 12 02/20/2014 1432   ALKPHOS 84 09/05/2020 0508   ALKPHOS 86 02/20/2014 1432   BILITOT 0.3 09/05/2020 0508   BILITOT 0.32 02/20/2014 1432   GFRNONAA 14 (L) 09/05/2020 0508   GFRAA 8 (L) 04/08/2019 1702   Lipase     Component Value Date/Time   LIPASE 47 08/15/2020 0659       Studies/Results: DG Abd 2 Views  Result Date: 09/04/2020 CLINICAL DATA:  Abdominal distension, abdominal pain, GI bleed, question Clostridium difficile EXAM: ABDOMEN - 2 VIEW COMPARISON:  08/31/2020 FINDINGS: Dilated small bowel loops in the mid abdomen. Some gas and contrast present in the colon. Biopsy clip ascending colon. Minimal rectal gas. No bowel wall thickening or free air. Osseous structures unremarkable. IMPRESSION: Persistent distention of small bowel loops in the mid  abdomen, slightly decreased from previous exam, likely representing slightly improved small bowel obstruction. Electronically Signed   By: Lavonia Dana M.D.   On: 09/04/2020 14:39   [place] EKG SITE RITE  Result Date: 09/04/2020 If Site Rite image not attached, placement could not be confirmed due to current cardiac rhythm.  [place] EKG SITE RITE  Result Date: 09/03/2020 If [facility] image not attached, placement could not be confirmed due to current cardiac rhythm.   Anti-infectives: Anti-infectives (From admission, onward)   Start     Dose/Rate Route Frequency Ordered Stop   09/01/20 1600  metroNIDAZOLE (FLAGYL) IVPB 500 mg        500 mg 100 mL/hr over 60 Minutes Intravenous Every 12 hours 09/01/20 1315      08/26/20 1000  vancomycin (VANCOCIN) 50 mg/mL oral solution 500 mg        500 mg Oral 4 times daily 08/26/20 0740 09/03/20 2159   08/24/20 2100  vancomycin (VANCOCIN) 50 mg/mL oral solution 125 mg  Status:  Discontinued        125 mg Oral 4 times daily 08/24/20 1717 08/26/20 0740       Assessment/Plan Hx of rectal CA s/p LAR and chemo/radiation  HTN Hx of CVA COPD ESRD on HD A. Fib  C.Diff colitis - WBC 19 this AM, trending back up - patient with mild generalized ttp but no peritonitis  - repeat CT A/P today  - continue IV flagyl, he has completed PO vanc - keep NPO, TPN - if he needs surgery, he would require at least a sigmoid resection and possibly a total abdominal colectomy. He would have a permanent colostomy vs ileostomy. This is a high risk surgery for him. His wife was going to discuss further with family yesterday   FEN: NPO, IVF; TPN VTE: heparin with HD ID: PO vanc 2/18>2/28; IV flagyl 1/26>>  LOS: 12 days    [name] , [facility] 09/05/2020, 8:49 AM Please see Amion for pager number during day hours 7:00am-4:30pm

## 2020-09-06 ENCOUNTER — Telehealth: Payer: Self-pay | Admitting: Pharmacist

## 2020-09-06 DIAGNOSIS — R140 Abdominal distension (gaseous): Secondary | ICD-10-CM | POA: Diagnosis not present

## 2020-09-06 DIAGNOSIS — K633 Ulcer of intestine: Secondary | ICD-10-CM | POA: Diagnosis not present

## 2020-09-06 DIAGNOSIS — A0472 Enterocolitis due to Clostridium difficile, not specified as recurrent: Secondary | ICD-10-CM | POA: Diagnosis not present

## 2020-09-06 DIAGNOSIS — K567 Ileus, unspecified: Secondary | ICD-10-CM | POA: Diagnosis not present

## 2020-09-06 DIAGNOSIS — K559 Vascular disorder of intestine, unspecified: Secondary | ICD-10-CM

## 2020-09-06 LAB — CBC WITH DIFFERENTIAL/PLATELET
Abs Immature Granulocytes: 0.75 10*3/uL — ABNORMAL HIGH (ref 0.00–0.07)
Basophils Absolute: 0 10*3/uL (ref 0.0–0.1)
Basophils Relative: 0 %
Eosinophils Absolute: 0.1 10*3/uL (ref 0.0–0.5)
Eosinophils Relative: 1 %
HCT: 23.9 % — ABNORMAL LOW (ref 39.0–52.0)
Hemoglobin: 7.4 g/dL — ABNORMAL LOW (ref 13.0–17.0)
Immature Granulocytes: 5 %
Lymphocytes Relative: 5 %
Lymphs Abs: 0.9 10*3/uL (ref 0.7–4.0)
MCH: 27.2 pg (ref 26.0–34.0)
MCHC: 31 g/dL (ref 30.0–36.0)
MCV: 87.9 fL (ref 80.0–100.0)
Monocytes Absolute: 1.8 10*3/uL — ABNORMAL HIGH (ref 0.1–1.0)
Monocytes Relative: 11 %
Neutro Abs: 12.5 10*3/uL — ABNORMAL HIGH (ref 1.7–7.7)
Neutrophils Relative %: 78 %
Platelets: 252 10*3/uL (ref 150–400)
RBC: 2.72 MIL/uL — ABNORMAL LOW (ref 4.22–5.81)
RDW: 21 % — ABNORMAL HIGH (ref 11.5–15.5)
WBC: 16 10*3/uL — ABNORMAL HIGH (ref 4.0–10.5)
nRBC: 0 % (ref 0.0–0.2)

## 2020-09-06 LAB — COMPREHENSIVE METABOLIC PANEL
ALT: 9 U/L (ref 0–44)
AST: 10 U/L — ABNORMAL LOW (ref 15–41)
Albumin: 1.8 g/dL — ABNORMAL LOW (ref 3.5–5.0)
Alkaline Phosphatase: 73 U/L (ref 38–126)
Anion gap: 9 (ref 5–15)
BUN: 31 mg/dL — ABNORMAL HIGH (ref 8–23)
CO2: 24 mmol/L (ref 22–32)
Calcium: 8.8 mg/dL — ABNORMAL LOW (ref 8.9–10.3)
Chloride: 102 mmol/L (ref 98–111)
Creatinine, Ser: 5.61 mg/dL — ABNORMAL HIGH (ref 0.61–1.24)
GFR, Estimated: 10 mL/min — ABNORMAL LOW (ref >60)
Glucose, Bld: 117 mg/dL — ABNORMAL HIGH (ref 70–99)
Potassium: 3.2 mmol/L — ABNORMAL LOW (ref 3.5–5.1)
Sodium: 135 mmol/L (ref 135–145)
Total Bilirubin: 0.1 mg/dL — ABNORMAL LOW (ref 0.3–1.2)
Total Protein: 4.7 g/dL — ABNORMAL LOW (ref 6.5–8.1)

## 2020-09-06 LAB — GLUCOSE, CAPILLARY
Glucose-Capillary: 118 mg/dL — ABNORMAL HIGH (ref 70–99)
Glucose-Capillary: 133 mg/dL — ABNORMAL HIGH (ref 70–99)
Glucose-Capillary: 144 mg/dL — ABNORMAL HIGH (ref 70–99)

## 2020-09-06 LAB — MAGNESIUM: Magnesium: 1.8 mg/dL (ref 1.7–2.4)

## 2020-09-06 LAB — PHOSPHORUS: Phosphorus: 3.2 mg/dL (ref 2.5–4.6)

## 2020-09-06 MED ORDER — PANTOPRAZOLE SODIUM 40 MG PO TBEC
40.0000 mg | DELAYED_RELEASE_TABLET | Freq: Two times a day (BID) | ORAL | Status: DC
  Administered 2020-09-06 – 2020-09-16 (×20): 40 mg via ORAL
  Filled 2020-09-06 (×20): qty 1

## 2020-09-06 MED ORDER — DOXERCALCIFEROL 4 MCG/2ML IV SOLN
INTRAVENOUS | Status: AC
  Administered 2020-09-06: 7 ug via INTRAVENOUS
  Filled 2020-09-06: qty 4

## 2020-09-06 MED ORDER — POTASSIUM CHLORIDE 10 MEQ/100ML IV SOLN
10.0000 meq | INTRAVENOUS | Status: AC
  Administered 2020-09-06 (×4): 10 meq via INTRAVENOUS
  Filled 2020-09-06 (×4): qty 100

## 2020-09-06 MED ORDER — HYDROMORPHONE HCL 1 MG/ML IJ SOLN
INTRAMUSCULAR | Status: AC
  Administered 2020-09-06: 0.5 mg via INTRAVENOUS
  Filled 2020-09-06: qty 0.5

## 2020-09-06 MED ORDER — LIP MEDEX EX OINT
TOPICAL_OINTMENT | CUTANEOUS | Status: DC | PRN
  Filled 2020-09-06: qty 7

## 2020-09-06 MED ORDER — VANCOMYCIN 50 MG/ML ORAL SOLUTION
500.0000 mg | Freq: Four times a day (QID) | ORAL | Status: DC
  Administered 2020-09-06: 500 mg via ORAL
  Filled 2020-09-06 (×2): qty 10

## 2020-09-06 MED ORDER — TRACE MINERALS CU-MN-SE-ZN 300-55-60-3000 MCG/ML IV SOLN: INTRAVENOUS | Status: DC

## 2020-09-06 MED ORDER — MAGNESIUM SULFATE 2 GM/50ML IV SOLN
2.0000 g | Freq: Once | INTRAVENOUS | Status: AC
  Administered 2020-09-06: 2 g via INTRAVENOUS
  Filled 2020-09-06: qty 50

## 2020-09-06 MED ORDER — TRACE MINERALS CU-MN-SE-ZN 300-55-60-3000 MCG/ML IV SOLN
INTRAVENOUS | Status: AC
  Filled 2020-09-06: qty 676

## 2020-09-06 MED ORDER — FIDAXOMICIN 200 MG PO TABS
200.0000 mg | ORAL_TABLET | Freq: Two times a day (BID) | ORAL | Status: AC
  Administered 2020-09-06 – 2020-09-16 (×20): 200 mg via ORAL
  Filled 2020-09-06 (×22): qty 1

## 2020-09-06 NOTE — Progress Notes (Signed)
PT Cancellation Note  Patient Details Name: Jason VANBLARCOM Sr. MRN: [id number] DOB: 07-Apr-1946   Cancelled Treatment:    Reason Eval/Treat Not Completed: (P) Patient at procedure or test/unavailable. Deferred in AM as pt working with OT, in PM pt at HD dept. Will continue efforts per PT POC as schedule permits.   [name] [name] 09/06/2020, 2:16 PM

## 2020-09-06 NOTE — Progress Notes (Addendum)
Daily Rounding Note  09/06/2020, 10:43 AM  LOS: 13 days   SUBJECTIVE:   Chief complaint:   Abdominal pain, diarrhea.  Loose, nonbloody stools continue along with abdominal pain upn to 8/10 severity.  Stools mucoid, greenish and bulk of episodes are at night.  OBJECTIVE:         Vital signs in last 24 hours:    Temp:  [97.8 F (36.6 C)-98.8 F (37.1 C)] 98.6 F (37 C) (03/03 0816) Pulse Rate:  [87-104] 100 (03/03 0816) Resp:  [15-20] 16 (03/03 0816) BP: (113-188)/(70-96) 171/80 (03/03 0816) SpO2:  [93 %-100 %] 100 % (03/03 0816) Last BM Date: 09/05/20 Filed Weights   09/03/20 0647 09/04/20 0725 09/04/20 1032  Weight: 52.3 kg 54 kg 52.5 kg   General: looks ill   Heart: RRR Chest: no dyspnea or cough Abdomen: Soft, diffusely tender to light/moderate touch.  No guarding or rebound.  Bowel sounds active. Extremities: No CCE Neuro/Psych: Appropriate.  Oriented x3.  No tremors or gross weakness.   Lab Results: Recent Labs    09/04/20 0124 09/05/20 0508 09/06/20 0524  WBC 15.8* 19.4* 16.0*  HGB 7.9* 7.7* 7.4*  HCT 24.4* 24.8* 23.9*  PLT 295 296 252   BMET Recent Labs    09/04/20 0124 09/05/20 0508 09/06/20 0524  NA 129* 136 135  K 3.0* 3.2* 3.2*  CL 93* 101 102  CO2 23 25 24   GLUCOSE 105* 93 117*  BUN 30* 16 31*  CREATININE 6.49* 4.34* 5.61*  CALCIUM 8.5* 8.4* 8.8*   LFT Recent Labs    09/04/20 0124 09/05/20 0508 09/06/20 0524  PROT 5.1* 4.9* 4.7*  ALBUMIN 1.9* 1.9* 1.8*  AST 12* 12* 10*  ALT 8 9 9   ALKPHOS 92 84 73  BILITOT 0.6 0.3 0.1*   Studies/Results: CT ABDOMEN PELVIS W CONTRAST  Result Date: 09/05/2020 CLINICAL DATA:  Abdominal pain EXAM: CT ABDOMEN AND PELVIS WITH CONTRAST TECHNIQUE: Multidetector CT imaging of the abdomen and pelvis was performed using the standard protocol following bolus administration of intravenous contrast. CONTRAST:  148mL OMNIPAQUE IOHEXOL 300 MG/ML  SOLN  COMPARISON:  08/29/2020 FINDINGS: Lower chest: Mild infiltrate is noted in the left lung base. Minimal pleural effusion is noted bilaterally. Hepatobiliary: Dependent density is noted within the gallbladder new from the prior exam likely representing vicarious excretion of contrast or mild gallbladder sludge. Scattered small hypodensities are again seen within the liver likely representing cysts but incompletely evaluated on this exam. Pancreas: Unremarkable. No pancreatic ductal dilatation or surrounding inflammatory changes. Spleen: Normal in size without focal abnormality. Adrenals/Urinary Tract: Adrenal glands are within normal limits. Kidneys demonstrate a normal enhancement pattern with multiple bilateral cysts stable in appearance from the prior exam. Some of these are hyperdense in nature consistent with proteinaceous or hemorrhagic cysts. No obstructive changes are seen. The bladder is partially distended. Stomach/Bowel: Colon is predominately decompressed. Scattered diverticular changes noted without evidence of diverticulitis. The appendix is within normal limits. Stomach is decompressed when compared with the prior exam. No small bowel dilatation is seen. Mild inflammatory change surrounding the rectum is seen, slightly progressed when compared with the prior exam. Previous anastomosis in the small bowel is again identified and stable. Vascular/Lymphatic: Aortic atherosclerosis. No enlarged abdominal or pelvic lymph nodes. Reproductive: Prostate is unremarkable. Other: No abdominal wall hernia or abnormality. No abdominopelvic ascites. Musculoskeletal: Degenerative changes of lumbar spine are noted. Defect is noted within the right rectus muscle consistent with  prior ileostomy which has been reversed. IMPRESSION: Mild increased perirectal inflammatory change likely representing some focal colitis. Diverticulosis without diverticulitis. Multiple stable cystic lesions within the kidneys. New mild  infiltrate in the left lower lobe. Minimal effusions are noted bilaterally. New dependent density within the gallbladder likely representing sludge or vicarious excretion of contrast. Electronically Signed   By: Inez Catalina M.D.   On: 09/05/2020 11:51     Scheduled Meds: . sodium chloride   Intravenous Once  . Chlorhexidine Gluconate Cloth  6 each Topical Q0600  . Chlorhexidine Gluconate Cloth  6 each Topical Q0600  . [START ON 09/08/2020] darbepoetin (ARANESP) injection - DIALYSIS  200 mcg Intravenous Q Sat-HD  . doxercalciferol  7 mcg Intravenous Q T,Th,Sa-HD  . pantoprazole (PROTONIX) IV  40 mg Intravenous Q12H  . sodium chloride flush  10-40 mL Intracatheter Q12H  . vancomycin  500 mg Oral Q6H   Continuous Infusions: . sodium chloride    . sodium chloride    . magnesium sulfate bolus IVPB    . potassium chloride 10 mEq (09/06/20 1018)  . TPN ADULT (ION) 65 mL/hr at 09/05/20 1756  . TPN ADULT (ION)     PRN Meds:.sodium chloride, sodium chloride, acetaminophen **OR** acetaminophen, alteplase, heparin, HYDROmorphone (DILAUDID) injection, hyoscyamine, lidocaine (PF), lidocaine-prilocaine, metoprolol tartrate, ondansetron (ZOFRAN) IV, pentafluoroprop-tetrafluoroeth, promethazine, sodium chloride flush  ASSESMENT:   *  Acute on chronic diarrhea C diff + and completed po vanc an IV flagyl.  However the current attending Dr [place] restarted oral vancomycin today.   2/26 flex sig: Anal stricture, patchy severe ulceration.  Biopsies suspicious for ischemia with fibrosis in the rectum. 2/26 EGD: Esophagitis, gastritis, retained gastric fluid.  Biopsies show reactive gastropathy. 09/05/2020 CTAP w contrast: Mild perirectal inflammation/focal colitis, aortic atherosclerosis. Protonix IV bid in place. High risk for colon surgery, Dr Zenia Resides following.    *   Protein malnutrition.  On TPN  *   ESRD.  HD TTS.  *   A. fib, Coumadin PTA.  Supratherapeutic INR at admission, reversed with vitamin  K, FFP.  Coumadin on hold.  Heparin only w HD.    *    Anemia of CKD.  Hgb nadir 6.8 on 2/19. 3 PRBCs 2/19-2/24.  Aranesp q Saturday.  Hgb drifting 9.7 >> 7.4 over last 6 days.    PLAN   *   Sips of clears but no further po per rec of Dr Zenia Resides.  Continue TPN.     Azucena Freed  09/06/2020, 10:43 AM Phone [place] Attending   I have taken an interval history, reviewed the chart and examined the patient. I agree with the Advanced Practitioner's note, impression and recommendations.    Continues to be about the same.  Agree is reasonable to restart vancomycin.  It is unclear exactly what primary problem is as he has several both gastrointestinal and otherwise.  I have communicated with Drs. Zenia Resides and [place] and we have decided to pursue ID consultation regarding the C. difficile issue.  He had a positive toxin and GDH antigen as an outpatient.  He has not had a positive PCR.  His flex sig did not look like C. Difficile.  I find his signs and symptoms out of proportion to the objective information we have though that is not necessarily valid but the CT scan and sigmoidoscopy though abnormal do not correlate in my experience, and the last CT looked better.   Agree with Dr. [place]  that moving more towards comfort care is appropriate.  I will discuss that with patient and family as well.  We have decided to allow clear liquids but stop if his abdominal pain worsens.   Gatha Mayer, MD, [facility] 09/06/2020 1:40 PM  [phone]

## 2020-09-06 NOTE — Progress Notes (Signed)
Progress Note  5 Days Post-Op  Subjective: WBC 16, afebrile. Continues to have loose stools, nonbloody. Reports continued abdominal pain. CT yesterday showed persistent thickening of the colonic conduit in the pelvis.  Objective: Vital signs in last 24 hours: Temp:  [97.8 F (36.6 C)-98.8 F (37.1 C)] 98.6 F (37 C) (03/03 0816) Pulse Rate:  [87-104] 100 (03/03 0816) Resp:  [15-20] 16 (03/03 0816) BP: (113-188)/(70-96) 171/80 (03/03 0816) SpO2:  [93 %-100 %] 100 % (03/03 0816) Last BM Date: 09/05/20  Intake/Output from previous day: 03/02 0701 - 03/03 0700 In: 1463.6 [I.V.:1110.9; IV Piggyback:352.7] Out: -  Intake/Output this shift: No intake/output data recorded.  PE: General: pleasant, WD, cachectic male who is laying in bed in NAD Heart: regular, rate, and rhythm.   Lungs: Respiratory effort nonlabored Abd: soft, nondistended, mild diffuse tenderness to palpation MS: all 4 extremities are symmetrical with no cyanosis, clubbing, or edema. Skin: warm and dry with no masses, lesions, or rashes Psych: A&Ox3 with an appropriate affect.   Lab Results:  Recent Labs    09/05/20 0508 09/06/20 0524  WBC 19.4* 16.0*  HGB 7.7* 7.4*  HCT 24.8* 23.9*  PLT 296 252   BMET Recent Labs    09/05/20 0508 09/06/20 0524  NA 136 135  K 3.2* 3.2*  CL 101 102  CO2 25 24  GLUCOSE 93 117*  BUN 16 31*  CREATININE 4.34* 5.61*  CALCIUM 8.4* 8.8*   PT/INR No results for input(s): LABPROT, INR in the last 72 hours. CMP     Component Value Date/Time   NA 135 09/06/2020 0524   NA 140 02/24/2017 0000   NA 138 02/20/2014 1432   K 3.2 (L) 09/06/2020 0524   K 3.5 02/20/2014 1432   CL 102 09/06/2020 0524   CL 101 12/09/2012 1048   CO2 24 09/06/2020 0524   CO2 30 (H) 02/20/2014 1432   GLUCOSE 117 (H) 09/06/2020 0524   GLUCOSE 93 02/20/2014 1432   GLUCOSE 96 12/09/2012 1048   BUN 31 (H) 09/06/2020 0524   BUN 50 (A) 08/10/2017 0000   BUN 36.0 (H) 02/20/2014 1432    CREATININE 5.61 (H) 09/06/2020 0524   CREATININE 2.4 (H) 02/20/2014 1432   CALCIUM 8.8 (L) 09/06/2020 0524   CALCIUM 10.4 02/20/2014 1432   PROT 4.7 (L) 09/06/2020 0524   PROT 7.2 02/20/2014 1432   ALBUMIN 1.8 (L) 09/06/2020 0524   ALBUMIN 3.8 02/20/2014 1432   AST 10 (L) 09/06/2020 0524   AST 22 02/20/2014 1432   ALT 9 09/06/2020 0524   ALT 12 02/20/2014 1432   ALKPHOS 73 09/06/2020 0524   ALKPHOS 86 02/20/2014 1432   BILITOT 0.1 (L) 09/06/2020 0524   BILITOT 0.32 02/20/2014 1432   GFRNONAA 10 (L) 09/06/2020 0524   GFRAA 8 (L) 04/08/2019 1702   Lipase     Component Value Date/Time   LIPASE 47 08/15/2020 0659       Studies/Results: CT ABDOMEN PELVIS W CONTRAST  Result Date: 09/05/2020 CLINICAL DATA:  Abdominal pain EXAM: CT ABDOMEN AND PELVIS WITH CONTRAST TECHNIQUE: Multidetector CT imaging of the abdomen and pelvis was performed using the standard protocol following bolus administration of intravenous contrast. CONTRAST:  168mL OMNIPAQUE IOHEXOL 300 MG/ML  SOLN COMPARISON:  08/29/2020 FINDINGS: Lower chest: Mild infiltrate is noted in the left lung base. Minimal pleural effusion is noted bilaterally. Hepatobiliary: Dependent density is noted within the gallbladder new from the prior exam likely representing vicarious excretion of contrast or mild  gallbladder sludge. Scattered small hypodensities are again seen within the liver likely representing cysts but incompletely evaluated on this exam. Pancreas: Unremarkable. No pancreatic ductal dilatation or surrounding inflammatory changes. Spleen: Normal in size without focal abnormality. Adrenals/Urinary Tract: Adrenal glands are within normal limits. Kidneys demonstrate a normal enhancement pattern with multiple bilateral cysts stable in appearance from the prior exam. Some of these are hyperdense in nature consistent with proteinaceous or hemorrhagic cysts. No obstructive changes are seen. The bladder is partially distended.  Stomach/Bowel: Colon is predominately decompressed. Scattered diverticular changes noted without evidence of diverticulitis. The appendix is within normal limits. Stomach is decompressed when compared with the prior exam. No small bowel dilatation is seen. Mild inflammatory change surrounding the rectum is seen, slightly progressed when compared with the prior exam. Previous anastomosis in the small bowel is again identified and stable. Vascular/Lymphatic: Aortic atherosclerosis. No enlarged abdominal or pelvic lymph nodes. Reproductive: Prostate is unremarkable. Other: No abdominal wall hernia or abnormality. No abdominopelvic ascites. Musculoskeletal: Degenerative changes of lumbar spine are noted. Defect is noted within the right rectus muscle consistent with prior ileostomy which has been reversed. IMPRESSION: Mild increased perirectal inflammatory change likely representing some focal colitis. Diverticulosis without diverticulitis. Multiple stable cystic lesions within the kidneys. New mild infiltrate in the left lower lobe. Minimal effusions are noted bilaterally. New dependent density within the gallbladder likely representing sludge or vicarious excretion of contrast. Electronically Signed   By: Inez Catalina M.D.   On: 09/05/2020 11:51   DG Abd 2 Views  Result Date: 09/04/2020 CLINICAL DATA:  Abdominal distension, abdominal pain, GI bleed, question Clostridium difficile EXAM: ABDOMEN - 2 VIEW COMPARISON:  08/31/2020 FINDINGS: Dilated small bowel loops in the mid abdomen. Some gas and contrast present in the colon. Biopsy clip ascending colon. Minimal rectal gas. No bowel wall thickening or free air. Osseous structures unremarkable. IMPRESSION: Persistent distention of small bowel loops in the mid abdomen, slightly decreased from previous exam, likely representing slightly improved small bowel obstruction. Electronically Signed   By: Lavonia Dana M.D.   On: 09/04/2020 14:39   [place] EKG SITE RITE  Result  Date: 09/04/2020 If Site Rite image not attached, placement could not be confirmed due to current cardiac rhythm.   Anti-infectives: Anti-infectives (From admission, onward)   Start     Dose/Rate Route Frequency Ordered Stop   09/06/20 0930  vancomycin (VANCOCIN) 50 mg/mL oral solution 500 mg        500 mg Oral Every 6 hours 09/06/20 0830 09/16/20 1159   09/01/20 1600  metroNIDAZOLE (FLAGYL) IVPB 500 mg  Status:  Discontinued        500 mg 100 mL/hr over 60 Minutes Intravenous Every 12 hours 09/01/20 1315 09/05/20 1430   08/26/20 1000  vancomycin (VANCOCIN) 50 mg/mL oral solution 500 mg        500 mg Oral 4 times daily 08/26/20 0740 09/03/20 2159   08/24/20 2100  vancomycin (VANCOCIN) 50 mg/mL oral solution 125 mg  Status:  Discontinued        125 mg Oral 4 times daily 08/24/20 1717 08/26/20 0740       Assessment/Plan Hx of rectal CA s/p LAR and chemo/radiation  HTN Hx of CVA COPD ESRD on HD A. Fib  Ischemic colitis - WBC relatively stable at 16 - Patient continues to have generalized pain but abdomen is soft without signs of peritonitis. No signs of pneumatosis or portal venous gas on CT, although there is persistent thickening consistent  with colitis. Surgical resection would be high risk given prior radiation to the pelvis and prior rectal dissection with coloanal anastomosis. For now recommend continued medical management of colitis. Ensure patient stays adequately hydrated and maintain normotension to maximize perfusion of the colon. - Given persistent pain and CT findings, resume antibiotics today - Ok for sips of clear liquids, but do not advance diet any further. Continue TPN for nutritional support. - We will continue to follow closely  Michaelle Birks, [facility], Hepatobiliary and Pancreatic Surgery 09/06/20 9:17 AM

## 2020-09-06 NOTE — Progress Notes (Signed)
Pt had 4 medium sized stools over night. All green/ mucous. Skin moisture barrier applied. Will continue to monitor.

## 2020-09-06 NOTE — Consult Note (Signed)
[name] for Infectious Disease    Date of Admission:  08/24/2020      Total days of antibiotics                Reason for Consult: CDiff infection     Referring Provider: [place]  Primary Care Provider: [name], MD   Assessment: Jason Cotta Sr. is a 75 y.o. male with history of chronic diarrhea followed by [facility] outpatient with acute abrupt worsening about a month ago now.  Complicated also by nausea and vomiting.  He had an outpatient CDiff test that was positive for Antigen and toxin, treated with 10-day course of vancomycin PO completed this past Sunday. CTA results reveal concern for ischemic (blocked IMA with patent collaterals) process as does pathology from flex sigmoid biopsies which revealed chronic ulcerations that were not bleeding at the time. His recently repeated CT scan reveals improvement with regards to decompressed colon compared to previous study with treatment of ileus, however his diarrhea and abdominal pain persist. His WBC overall are improved from admission but remain elevated. He is not toxic and BPs stable with systolic 676-720N. He has since been started on TPN as it is also felt his malnutrition is a barrier to his improvement as well.   Challenging to know if persistent symptoms are due to untreated / failed treated CDiff infection.  He has described many episodes of vomiting up until recently so I suspect it is possible he did not absorb treatment well enough to clear infection. I think if we were to approach this a a true failure would prefer to use Dificid for him instead to see if he has improvement on that. Less frequent dosing may be helpfu. He states his vomiting has been better as of recently so hopeful he will tolerate this well and absorb it. Not sure he needs to continue IV flagyl at this time if he can hold down PO therapy. Will check in with him again tomorrow to see how he is doing from vomiting standpoint.      Plan: 1. Stop vancomycin --> start PO Difficid 2. Follow BM trend and abd pain quality  3. Optimize nutrition as is being done3 4. Follow GI/Surgery recs.     Active Problems:   Hematochezia   GIB (gastrointestinal bleeding)   C. difficile colitis   Generalized abdominal pain   Non-intractable vomiting   Abdominal distension (gaseous)   Gastritis and gastroduodenitis   Noninfectious gastroenteritis   Colon ulcer   Intestinal occlusion (HCC)   Protein-calorie malnutrition, severe   Ischemic colitis ([place])   . sodium chloride   Intravenous Once  . Chlorhexidine Gluconate Cloth  6 each Topical Q0600  . Chlorhexidine Gluconate Cloth  6 each Topical Q0600  . [START ON 09/08/2020] darbepoetin (ARANESP) injection - DIALYSIS  200 mcg Intravenous Q Sat-HD  . doxercalciferol  7 mcg Intravenous Q T,Th,Sa-HD  . pantoprazole  40 mg Oral BID  . sodium chloride flush  10-40 mL Intracatheter Q12H  . vancomycin  500 mg Oral Q6H    HPI: Jason VALENZA Sr. is a 75 y.o. male admitted on 08/24/2020 for abdominal pain, nausea, vomiting and rectal bleeding from home.   PMHx of afib on chronic coumadin, PUD (recent duodenal ulcer), CVA, OSA, HTN, COPD, Rectal cancer, ESRD on HD.   His abdominal symptoms started the week prior to his admission when he noticed some blood in his stool taht  was a pretty small amount. He was already having diarrhea at that time and wiping a lot more - presumed he irritated things. The diarrhea he was experiencing was pretty severe post-prandial with immediate BM following eating any food. CDiff was collected at his GI doctor's office and results revealed +toxin and +antigen on 2/17. He was given a prescription for vancomycin PO   Earlier in February was given course of PO doxycycline for RLE cellulitis - transferred to hospital for lack of improvement. Don't see where he was seen and family picked him up. Not clear he received any other antibiotics at that time either.    On 2/9 his wife called GI team declaring intractable diarrhea (he does have chronic diarrhea x 2 years now that had a negative extensive work up over that course). Uncontrolled by typical measures.   Admitted to hospital 2/18 for UGIB with N/V/D and BRBPR x 1 week. He was continued on PO vancomycin and florestor for known + CDiff infection. 2/26 EGD and flex sig - With esophagitis, gastritis w/o bleeding. Nonbleeding ulcerated mucosa were present in the proximal sigmoid colon characterized by scattered, circumferential ulcers, including several deep ulcers. Suspicious for ischemia.  Pathology of stomach samples negative for H pylori, malignancy. Colonic biopsies with exudate and granulation tissue c/w ulcer favoring ischemic ulceration. No pseudomembranous process seen   [name] does not think he had much improvement at all on the vancomycin therapy he received for CDiff but so much has been going on for him he is not completely sure. Still describes 3-4 watery BMs a day, more occurrences at night. Very tender abdomen and feels overall quite miserable. Has lost significant weight and tired of using the bathroom. No fevers or chills. He is currently on TPN and unable to tolerate PO diet. Surgery and GI team following.     Review of Systems: Review of Systems  Constitutional: Positive for malaise/fatigue and weight loss. Negative for chills and fever.  Respiratory: Negative for cough and shortness of breath.   Cardiovascular: Negative for chest pain, claudication and leg swelling.  Gastrointestinal: Positive for abdominal pain, diarrhea and nausea.  Skin: Negative for rash.    Past Medical History:  Diagnosis Date  . Anemia   . Arthritis   . Atrial fibrillation ([place])    on Coumadin   . Chronic renal insufficiency 07/31/2011  . COPD (chronic obstructive pulmonary disease) ([place])    pt denies  . CVA (cerebral vascular accident) ([place]) 06/2018  . Deficiency anemia 07/20/2012  .  Diverticulosis   . Edema 09/21/2010   Qualifier: Diagnosis of  By: Jerold Coombe    . ERECTILE DYSFUNCTION, MILD 05/11/2007   Qualifier: Diagnosis of  By: Sherren Mocha MD, Jory Ee   . Exertional dyspnea 10/30/2015  . GOUT 05/11/2007   Qualifier: Diagnosis of  By: Sherren Mocha MD, Jory Ee   . Hereditary and idiopathic peripheral neuropathy 09/14/2014  . History of radiation therapy 02/21/13-03/31/13   rectum 50.4Gy total dose  . Hypersomnia 10/30/2015  . Hypertension   . Lung nodule 10/30/2015  . OAB (overactive bladder) 01/09/2016  . Obesity (BMI 30.0-34.9) 10/30/2015  . Obstructive sleep apnea 07/29/2010   HST 11/2015 AHI 54.  On autocpap  >> 10 cm   . Peripheral edema 09/19/2013  . Rectal cancer ([place]) 02/01/2013   rectal. Radiation and chemotherapy- remains on chemotherapy-next tx. 08-22-13.   . S/P partial lobectomy of lung 11/20/2015  . S/P thoracotomy   . Sleep apnea  wears cpap   . SUI (stress urinary incontinence), male 01/09/2016    Social History   Tobacco Use  . Smoking status: Former Smoker    Packs/day: 0.50    Years: 10.00    Pack years: 5.00    Types: Cigarettes    Quit date: 07/07/1977    Years since quitting: 43.1  . Smokeless tobacco: Never Used  Vaping Use  . Vaping Use: Never used  Substance Use Topics  . Alcohol use: Not Currently    Alcohol/week: 0.0 standard drinks  . Drug use: No    Family History  Problem Relation Age of Onset  . Stroke Mother   . Hypertension Mother   . Stroke Father   . Hypertension Father   . Heart disease Father   . CVA Father   . Hypertension Other   . Colon cancer Neg Hx   . Colon polyps Neg Hx   . Esophageal cancer Neg Hx   . Rectal cancer Neg Hx   . Stomach cancer Neg Hx    Allergies  Allergen Reactions  . Nsaids Other (See Comments)    Asked by surgeon to add this medication class as intolerance due to patients renal insufficiency.  . Lactase Diarrhea  . Lactose Intolerance (Gi) Diarrhea  . Phentermine Other (See Comments)     Unknown reaction  . Amlodipine Other (See Comments)    Edema     OBJECTIVE: Blood pressure (!) 153/88, pulse 82, temperature 98.5 F (36.9 C), temperature source Oral, resp. rate 16, height 5\' 8"  (1.727 m), weight 52.5 kg, SpO2 99 %.  Physical Exam Constitutional:      Appearance: Normal appearance. He is ill-appearing.     Comments: Resting in bed under blankets. Just done with dialysis. Pleasant.   HENT:     Mouth/Throat:     Mouth: Mucous membranes are moist.     Pharynx: Oropharynx is clear.  Eyes:     General: No scleral icterus. Cardiovascular:     Rate and Rhythm: Normal rate and regular rhythm.  Pulmonary:     Effort: Pulmonary effort is normal.     Breath sounds: Normal breath sounds.  Abdominal:     General: There is distension.     Tenderness: There is abdominal tenderness.  Musculoskeletal:        General: Normal range of motion.  Skin:    General: Skin is warm and dry.     Capillary Refill: Capillary refill takes less than 2 seconds.  Neurological:     Mental Status: He is alert and oriented to person, place, and time.     Lab Results Lab Results  Component Value Date   WBC 16.0 (H) 09/06/2020   HGB 7.4 (L) 09/06/2020   HCT 23.9 (L) 09/06/2020   MCV 87.9 09/06/2020   PLT 252 09/06/2020    Lab Results  Component Value Date   CREATININE 5.61 (H) 09/06/2020   BUN 31 (H) 09/06/2020   NA 135 09/06/2020   K 3.2 (L) 09/06/2020   CL 102 09/06/2020   CO2 24 09/06/2020    Lab Results  Component Value Date   ALT 9 09/06/2020   AST 10 (L) 09/06/2020   ALKPHOS 73 09/06/2020   BILITOT 0.1 (L) 09/06/2020     Microbiology: No results found for this or any previous visit (from the past 240 hour(s)).   Janene Madeira, MSN, NP-C [facility] for Infectious Disease [email or web address] Pager: [phone] Office: [phone]  [facility] Main Line: [phone]   09/06/2020 2:29 PM

## 2020-09-06 NOTE — Progress Notes (Signed)
PHARMACY - TOTAL PARENTERAL NUTRITION CONSULT NOTE   Indication: Prolonged ileus in the setting of ischemic colitis  Patient Measurements: Height: 5\' 8"  (172.7 cm) Weight: 52.5 kg (115 lb 11.9 oz) IBW/kg (Calculated) : 68.4   Body mass index is 17.6 kg/m. Usual Weight: ~63kg  Assessment:  74 yo M with hx rectal cancer s/p radiation/chemo, anteriro resection and ileostomy reversal, afib on warfarin with admit INR 8.8 now s/p reversal, ESRD on HD TTS, PUD with recent GIB, anemia of chronic disease, CVA, COPD, gout, and HTN admitted with melena, C.diff colitis, prolonged ileus, malnutrition with poor PO intake >11 days and hypoglycemia requiring D10 infusion. Patient lost ~15% of body weight in one month. Failed attempt to place NGT 2/26. PICC placement ok'd by nephrology. Pharmacy consulted for TPN.   Glucose / Insulin: BG <120, A1C 5.5, 0 insulin used  Electrolytes: Na 135, K still 3.2 and Phos to 3.2 after Kphos 30 mmol x1, CoCa 10.5; Mg 1.8, others wnl Renal: HD TTS  Hepatic: LFTs low-normal, albumin 1.8, prealbumin 8.9, TG 24 Intake / Output; MIVF: Off MIVF, 1.5L in;  BM x3 per patient, completed Cdiff tx, has chronic diarrhea  GI Imaging: 2/18 CT Abd- Wall thickening of the sigmoid colon and rectum, consistent with acute colitis/proctitis 2/24 CT Abd with IMA occlusion but celiac and SMA patent; no evidence of acute ischemia. Increased distension of the stomach, small bowel and colon since 08/24/2020. 2/25 DG abd- no NGT, Gastric and small bowel dilatation GI Surgeries / Procedures:  2/25 EGD- Mildly severe esophagitis with a small 2-3 mm ulcer with no bleeding; Retained fluid and small amounts of solid debris in stomach; attempted NGT multiple times but unable to advance  2/26 Flex sig -severely ulcerated mucosa of the sigmoid colon/ rectum, possibly radiation changes vs ischemia vs infection Central access: 3/1 TPN start date: 3/1   Nutritional Goals (RD recommendations  3/2) kCal: 1900-2100/d, Protein: 100-115g, Fluid: 1L + UOP  Goal TPN rate is 65 mL/hr (provides 101g of protein and avg of 1945kcals per day)  Current Nutrition:  TPN, NPO- may advance to CLD 3/3   Plan:  Continue TPN at goal 15mL/hr at 1800 -provides 101g of protein and avg of 1945 kcals per day, meeting 100% of estimated needs) 50g lipids on MWF only due to national shortage Electrolytes in TPN: gently Increase  Na to 100 mEq/L, K to 25 mEq/L, Mg to 6 mEq/L given ESRD; Continue Ca 28mEq/L, Phos to 6 mmol/L, Cl:Ac 1:1 Add standard MVI and trace elements to TPN, remove chromium for ESRD Stop SSI   Give Kcl 10 mEq IV x4  Give Mg 2g IV x1  Monitor TPN labs tomorrow then on Mon/Thurs Additional IVF per team   Benetta Spar, PharmD, BCPS, [place] Pharmacist  Please check AMION for all [place] phone numbers After 10:00 PM, call [place]

## 2020-09-06 NOTE — Progress Notes (Signed)
[facility] Progress Note   Subjective:     No major issues overnight-  Resting so did not wake as has been so miserable lately -  Getting some IV potassium in addition to TPN  Objective Vitals:   09/05/20 2053 09/06/20 0007 09/06/20 0429 09/06/20 0816  BP: (!) 113/96 (!) 155/82 (!) 161/78 (!) 171/80  Pulse: 89 87 100 100  Resp: 15 16 17 16   Temp: 98.7 F (37.1 C) 98.4 F (36.9 C) 98.8 F (37.1 C) 98.6 F (37 C)  TempSrc: Oral Oral Oral Oral  SpO2: 94% 100% 100% 100%  Weight:      Height:       Physical Exam General: elderly, ill appearing male, resting  Heart:+tachycardia, RR, no mrg Lungs:mostly CTAB, breath sounds decreased bilaterally Abdomen: soft, mildly tender throughout Extremities:no LE edema Dialysis Access: LU AVF +bruit  OP HD: [facility] TTS  4h  450/500  58kg  3K/2Ca   P2    L AVF    Heparin none  Mircera 50 (to start 2/22) Venofer 50  Hectorol 7   Assessment/Plan: 1. C. Diff colitis - on po Vanc/Florastor w/ persistent abd pain and diarrhea. Flex sig 2/26 showed severely ulcerated mucosa of the sigmoid colon/ rectum, possibly radiation changes vs ischemia vs infection. Per primary/GI. Now needing to do TPN.  I give permission for them to do PICC line a has a good functioning AVF.  Does not seem to be responding well to treatment so far 2. GI bleed/ ABL anemia:  w/ supra therapeutic INR on admit (EGD2/9with duodenal ulcer, treated w/ clip+ injection). On Protonix.  Hgb improved to 9's s/p 1unit pRBC 2/24. dec in hgb this week-   3. ESRD - HD TTS. Next HD 3/3. No heparin via AVF.  K is low-  Can only do 3 K bath-  No 4 k avail -  Supplement PO-  Now starting TPN 4. AFib- on coumadin. Supratherapeutic INR on admission.Reversed  5. HTN/Volume - BP's normal today.Under dry wt.  Had been getting d5 at 75 per hour, now stopped--  Sodium low pre HD so challenging a little-    6. Anemia CKD --see #1. Hgb9's, now 7's- has had 3u prbc's here total.  Continue aranesp - increased dose to 200qwk q Sat 7. MBD CKD --Corr Caok.   Cont home hectorol dosing with HD but holding PO meds  8. COPD  9. H/o prior rectal cancer - in 2014 treated w/ chemoRx,radiation, laparoscopic low anterior resection with diverting loop ileostomy and subsequent ileostomy takedown. 10. GOC - seen by palliative care, pt is now DNR    Louis Meckel  09/01/2020, 10:01 AM   Filed Weights   09/03/20 0647 09/04/20 0725 09/04/20 1032  Weight: 52.3 kg 54 kg 52.5 kg    Intake/Output Summary (Last 24 hours) at 09/06/2020 1057 Last data filed at 09/06/2020 0300 Gross per 24 hour  Intake 1463.61 ml  Output --  Net 1463.61 ml    Additional Objective Labs: Basic Metabolic Panel: Recent Labs  Lab 09/04/20 0124 09/05/20 0508 09/06/20 0524  NA 129* 136 135  K 3.0* 3.2* 3.2*  CL 93* 101 102  CO2 23 25 24   GLUCOSE 105* 93 117*  BUN 30* 16 31*  CREATININE 6.49* 4.34* 5.61*  CALCIUM 8.5* 8.4* 8.8*  PHOS 3.5 1.7* 3.2   Liver Function Tests: Recent Labs  Lab 09/04/20 0124 09/05/20 0508 09/06/20 0524  AST 12* 12* 10*  ALT 8 9 9  ALKPHOS 92 84 73  BILITOT 0.6 0.3 0.1*  PROT 5.1* 4.9* 4.7*  ALBUMIN 1.9* 1.9* 1.8*   CBC: Recent Labs  Lab 09/02/20 0343 09/03/20 0053 09/04/20 0124 09/05/20 0508 09/06/20 0524  WBC 19.5* 17.0* 15.8* 19.4* 16.0*  NEUTROABS 18.1* 14.4* 12.8* 16.0* 12.5*  HGB 9.1* 7.9* 7.9* 7.7* 7.4*  HCT 28.3* 25.2* 24.4* 24.8* 23.9*  MCV 85.0 85.4 85.0 87.0 87.9  PLT 318 272 295 296 252   Blood Culture    Component Value Date/Time   SDES IN/OUT CATH URINE 08/17/2020 1234   SPECREQUEST  08/17/2020 1234    NONE Performed at [facility], [place] [address] [place], [place] [address]    CULT 4,000 COLONIES/mL ENTEROCOCCUS GALLINARUM (A) 08/17/2020 1234   REPTSTATUS 08/21/2020 FINAL 08/17/2020 1234   CBG: Recent Labs  Lab 09/05/20 0758 09/05/20 1219 09/05/20 1623 09/06/20 0005 09/06/20 0814  GLUCAP 127* 136* 104*  118* 133*   Medications: . sodium chloride    . sodium chloride    . magnesium sulfate bolus IVPB    . potassium chloride 10 mEq (09/06/20 1018)  . TPN ADULT (ION) 65 mL/hr at 09/05/20 1756  . TPN ADULT (ION)     . sodium chloride   Intravenous Once  . Chlorhexidine Gluconate Cloth  6 each Topical Q0600  . Chlorhexidine Gluconate Cloth  6 each Topical Q0600  . [START ON 09/08/2020] darbepoetin (ARANESP) injection - DIALYSIS  200 mcg Intravenous Q Sat-HD  . doxercalciferol  7 mcg Intravenous Q T,Th,Sa-HD  . pantoprazole (PROTONIX) IV  40 mg Intravenous Q12H  . sodium chloride flush  10-40 mL Intracatheter Q12H  . vancomycin  500 mg Oral Q6H

## 2020-09-06 NOTE — Progress Notes (Unsigned)
Chronic Care Management Pharmacy Assistant   Name: Jason VEASEY Sr.  MRN: [id number] DOB: 1945-09-03  Reason for Encounter: Medication Review  PCP : [name], MD  Allergies:   Allergies  Allergen Reactions  . Nsaids Other (See Comments)    Asked by surgeon to add this medication class as intolerance due to patients renal insufficiency.  . Lactase Diarrhea  . Lactose Intolerance (Gi) Diarrhea  . Phentermine Other (See Comments)    Unknown reaction  . Amlodipine Other (See Comments)    Edema     Medications: Facility-Administered Encounter Medications as of 09/06/2020  Medication  . 0.9 %  sodium chloride infusion (Manually program via Guardrails IV Fluids)  . 0.9 %  sodium chloride infusion  . 0.9 %  sodium chloride infusion  . acetaminophen (TYLENOL) tablet 650 mg   Or  . acetaminophen (TYLENOL) suppository 650 mg  . alteplase (CATHFLO ACTIVASE) injection 2 mg  . Chlorhexidine Gluconate Cloth 2 % PADS 6 each  . Chlorhexidine Gluconate Cloth 2 % PADS 6 each  . [START ON 09/08/2020] Darbepoetin Alfa (ARANESP) injection 200 mcg  . doxercalciferol (HECTOROL) injection 7 mcg  . heparin injection 1,000 Units  . HYDROmorphone (DILAUDID) injection 0.5 mg  . hyoscyamine (LEVSIN SL) SL tablet 0.125 mg  . lidocaine (PF) (XYLOCAINE) 1 % injection 5 mL  . lidocaine-prilocaine (EMLA) cream 1 application  . magnesium sulfate IVPB 2 g 50 mL  . metoprolol tartrate (LOPRESSOR) injection 5 mg  . ondansetron (ZOFRAN) injection 4 mg  . pantoprazole (PROTONIX) injection 40 mg  . pentafluoroprop-tetrafluoroeth (GEBAUERS) aerosol 1 application  . promethazine (PHENERGAN) injection 12.5 mg  . sodium chloride flush (NS) 0.9 % injection 10-40 mL  . sodium chloride flush (NS) 0.9 % injection 10-40 mL  . TPN ADULT (ION)  . vancomycin (VANCOCIN) 50 mg/mL oral solution 500 mg   Outpatient Encounter Medications as of 09/06/2020  Medication Sig  . acetaminophen (TYLENOL) 325 MG tablet Take  650 mg by mouth every 4 (four) hours as needed for mild pain or moderate pain.   Marland Kitchen atorvastatin (LIPITOR) 20 MG tablet Take 1 tablet (20 mg total) by mouth every evening.  . cinacalcet (SENSIPAR) 30 MG tablet Take 90 mg by mouth daily.  . diphenoxylate-atropine (LOMOTIL) 2.5-0.025 MG tablet Take 1 tablet by mouth 4 (four) times daily.  Marland Kitchen FOSRENOL 1000 MG PACK Take 1,000 mg by mouth 3 (three) times daily with meals.   . gabapentin (NEURONTIN) 100 MG capsule TAKE 1 CAPSULE BY MOUTH THREE TIMES DAILY (Patient taking differently: Take 100 mg by mouth 3 (three) times daily.)  . lipase/protease/amylase (CREON) 36000 UNITS CPEP capsule Take 2 capsules (72,000 Units total) by mouth 3 (three) times daily with meals. May also take 1 capsule (36,000 Units total) as needed (with snacks).  . ondansetron (ZOFRAN) 4 MG tablet TAKE ONE TABLET BY MOUTH EVERY 8 HOURS AS NEEDED (Patient taking differently: Take 4 mg by mouth every 8 (eight) hours as needed for nausea or vomiting.)  . oxyCODONE (OXY IR/ROXICODONE) 5 MG immediate release tablet Take 1 tablet (5 mg total) by mouth every 6 (six) hours as needed for moderate pain.  . pantoprazole (PROTONIX) 40 MG tablet Take 1 tablet (40 mg total) by mouth 2 (two) times daily.  . polycarbophil (FIBERCON) 625 MG tablet Take 625 mg by mouth as needed for diarrhea or loose stools.  . warfarin (COUMADIN) 5 MG tablet Take 5 mg on Sun, Tue, Wed, Thurs, and  Sat. Take 2.5 mg on Mon and Fri. (Patient taking differently: Take 2.5-5 mg by mouth See admin instructions. Take 1 tablet (5 mg) by mouth on Sun, Tue, Wed, Thurs, and Sat. Take 1/2 tablet (2.5 mg) by mouth on Mon and Fri.)    Current Diagnosis: Patient Active Problem List   Diagnosis Date Noted  . Protein-calorie malnutrition, severe 09/05/2020  . Intestinal occlusion (HCC)   . Abdominal distension (gaseous)   . Gastritis and gastroduodenitis   . Noninfectious gastroenteritis   . Colon ulcer   . Generalized abdominal  pain   . Non-intractable vomiting   . C. difficile colitis   . GIB (gastrointestinal bleeding) 08/24/2020  . GI bleed 08/15/2020  . Acute blood loss anemia 08/15/2020  . Supratherapeutic INR 08/15/2020  . Fibula fracture 08/15/2020  . Hematochezia   . Acute gastric ulcer with hemorrhage   . Leg edema, right 08/13/2020  . Injury of right leg 08/13/2020  . Closed nondisplaced segmental fracture of shaft of right fibula 08/13/2020  . Cellulitis of right leg 08/06/2020  . Complication of vascular dialysis catheter 04/18/2020  . Other disorders of phosphorus metabolism 04/04/2020  . Allergy, unspecified, initial encounter 03/01/2020  . Anaphylactic shock, unspecified, initial encounter 03/01/2020  . Fever 01/16/2020  . Benign prostatic hyperplasia without lower urinary tract symptoms 12/12/2019  . Meralgia paraesthetica, right 08/12/2019  . 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 Gait disturbance, post-stroke 04/17/2019  . Iron deficiency anemia, unspecified 03/14/2019  . Chronic obstructive pulmonary disease, unspecified ([place]) 01/25/2019  . Coagulopathy ([place]) 01/25/2019  . Diarrhea, unspecified 01/25/2019  . Hypertensive chronic kidney disease with stage 5 chronic kidney disease or end stage renal disease ([place]) 01/25/2019  . Diverticulosis of intestine, part unspecified, without perforation or abscess without bleeding 01/25/2019  . Other idiopathic peripheral autonomic neuropathy 01/25/2019  . Hypokalemia 01/25/2019  . Male erectile dysfunction, unspecified 01/25/2019  . Pain, unspecified 01/25/2019  . Primary generalized (osteo)arthritis 01/25/2019  . Pruritus, unspecified 01/25/2019  . Secondary hyperparathyroidism of renal origin ([place]) 01/25/2019  . Anticoagulated 12/31/2018  . Paroxysmal atrial fibrillation ([place]) 12/22/2018  . Longstanding persistent atrial fibrillation ([place]) 09/20/2018  . Functional diarrhea 07/14/2018  . Benign essential HTN   . ESRD  on dialysis ([place]) 06/16/2018  . Mild malnutrition ([place]) 06/16/2018  . Stroke due to embolism ([place]) 06/16/2018  . Chronic HFrEF (heart failure with reduced ejection fraction) ([place])   . CVA (cerebral vascular accident) ([place]) 06/08/2018  . CHF (congestive heart failure) ([place]) 05/08/2018  . Hyperlipidemia LDL goal <70 09/23/2017  . OAB (overactive bladder) 01/09/2016  . SUI (stress urinary incontinence), male 01/09/2016  . Hypersomnia 10/30/2015  . Obesity (BMI 30.0-34.9) 10/30/2015  . Routine general medical examination at a health care facility 06/04/2015  . Medicare annual wellness visit, subsequent 06/04/2015  . Hereditary and idiopathic peripheral neuropathy 09/14/2014  . Rectal cancer ([place]) 02/01/2013  . Obstructive sleep apnea 07/29/2010  . GOUT 05/11/2007  . ERECTILE DYSFUNCTION, MILD 05/11/2007  . Essential hypertension 05/11/2007    Reviewed chart for medication changes ahead of medication coordination call.  No OVs, Consults, or hospital visits since last care coordination call/Pharmacist visit. (If appropriate, list visit date, provider name)  No medication changes indicated OR if recent visit, treatment plan here.  BP Readings from Last 3 Encounters:  09/06/20 (!) 171/80  08/20/20 (!) 118/50  08/19/20 122/64    Lab Results  Component Value Date   HGBA1C 5.5 06/09/2018  Patient obtains medications through Vials  30 Days   Last adherence delivery included:  Ondasetron Hcl 4mg  Take one tab by mouth every eight hours as needed   Patient is due for next adherence delivery on: 09-07-20. Called patient and reviewed medications and coordinated delivery.  This delivery to include:     Patient declined the following medications (meds) due to (reason)  Patient needs refills for ***.  Confirmed delivery date of 09-07-20, advised patient that pharmacy will contact them the morning of delivery.  Georgiana Shore ,[place] Pharmacist  Assistant [phone]  Follow-Up:  Pharmacist Review

## 2020-09-06 NOTE — Progress Notes (Signed)
Occupational Therapy Treatment Patient Details Name: Jason PHIFER Sr. MRN: [id number] DOB: 27-Oct-1945 Today's Date: 09/06/2020    History of present illness Pt is a 75 y.o. male recently admitted 08/15/20-08/19/20 with upper GIB and duodenal ulcer, now readmitted 08/24/20 with C. difficile collitis, lower GIB. PMH includes afib on Coumadin, peptic ulcer disease, rectal CA, ESRD ([facility] TTS), CVA, gout, COPD, s/p partial lung lobectomy (2017).   OT comments  Patient willing to attempt edge of bed sitting.  He is making incremental gains towards stated OT goals.  Barriers are listed below.  OT will continue to follow in the acute setting in order to maximize his functional status.  It is expected as abdominal pain resolves, his functional status should improve.  Patient declined out of bed to the recliner.  He is requesting pain meds prior.  SNF continues to be recommended.    Follow Up Recommendations  SNF    Equipment Recommendations  3 in 1 bedside commode    Recommendations for Other Services      Precautions / Restrictions Precautions Precautions: Fall Precaution Comments: WBAT in RLE in CAM boot since last admission Restrictions RLE Weight Bearing: Weight bearing as tolerated Other Position/Activity Restrictions: WBAT in CAM boot       Mobility Bed Mobility Overal bed mobility: Needs Assistance Bed Mobility: Supine to Sit;Sit to Supine     Supine to sit: Min assist;HOB elevated Sit to supine: Min assist   General bed mobility comments: assist to help with trunk, and scoot hips forward.  Only able to tolerate sitting edge of bed a few minutes.    Transfers                      Balance Overall balance assessment: Needs assistance Sitting-balance support: Feet supported;No upper extremity supported Sitting balance-Leahy Scale: Fair                                        Vision Baseline Vision/History: Wears glasses Patient Visual Report: No change  from baseline Vision Assessment?: No apparent visual deficits                         Pertinent Vitals/ Pain       Faces Pain Scale: Hurts little more Pain Location: abdomen and bottom Pain Descriptors / Indicators: Guarding;Tender Pain Intervention(s): Monitored during session;Patient requesting pain meds-RN notified                                                          Frequency  Min 2X/week        Progress Toward Goals  OT Goals(current goals can now be found in the care plan section)  Progress towards OT goals: Progressing toward goals  Acute Rehab OT Goals Patient Stated Goal: to go to rehab before returning home OT Goal Formulation: With patient Time For Goal Achievement: 09/12/20 Potential to Achieve Goals: Good  Plan Discharge plan remains appropriate    Co-evaluation                 AM-PAC OT "6 Clicks" Daily Activity     Outcome Measure   Help from another person eating  meals?: None Help from another person taking care of personal grooming?: A Little Help from another person toileting, which includes using toliet, bedpan, or urinal?: A Lot Help from another person bathing (including washing, rinsing, drying)?: A Lot Help from another person to put on and taking off regular upper body clothing?: A Little Help from another person to put on and taking off regular lower body clothing?: A Lot 6 Click Score: 16    End of Session    OT Visit Diagnosis: Unsteadiness on feet (R26.81);Other abnormalities of gait and mobility (R26.89);Muscle weakness (generalized) (M62.81);Pain;Dizziness and giddiness (R42)   Activity Tolerance Patient limited by pain;Patient limited by fatigue   Patient Left in bed;with call bell/phone within reach   Nurse Communication          Time: 2194-7125 OT Time Calculation (min): 12 min  Charges: OT General Charges $OT Visit: 1 Visit OT Treatments $Therapeutic Activity: 8-22  mins  09/06/2020  Rich, OTR/L  Acute Rehabilitation Services  Office:  [place] 09/06/2020, 10:12 AM

## 2020-09-06 NOTE — Progress Notes (Signed)
PROGRESS NOTE    Jason ARWOOD Sr.  DJT:[id number] DOB: 09-09-1945 DOA: 08/24/2020 PCP: [name], MD    Brief Narrative:  Jason Cotta Sr. is a 75 year old gentleman with past medical history significant for chronic atrial fibrillation on Coumadin, peptic ulcer disease, ESRD on HD TTS, history of rectal cancer status post low anterior resection and reversal of ileostomy with chemotherapy/radiation who presented to the ED with abdominal pain/cramping, nausea/vomiting/diarrhea and rectal bleeding. Patient with follow-up with his GI physician due to recent hospitalization from GI bleed due to duodenal ulcer, patient had repeat labs performed which was notably positive for C. difficile colitis. Patient was started on medication for his C. difficile infection outpatient, but he noticed a large amount of blood in his stool which was concerning which prompted further evaluation in the ED.  In the ED, patient was found to have an elevated INR, he was given oral vitamin K and started on Protonix. [facility] was consulted. Hospital service consulted for further evaluation and management of GI bleed with supratherapeutic INR in the setting of C. difficile colitis.   Assessment & Plan:   Active Problems:   Hematochezia   GIB (gastrointestinal bleeding)   C. difficile colitis   Generalized abdominal pain   Non-intractable vomiting   Abdominal distension (gaseous)   Gastritis and gastroduodenitis   Noninfectious gastroenteritis   Colon ulcer   Intestinal occlusion (HCC)   Protein-calorie malnutrition, severe   C. difficile colitis CT angiogram abdomen/pelvis 2/24 suspicious for ileus, no obstruction. Flexible sigmoidoscopy 09/01/2020 with severely ulcerated mucosa at the sigmoid colon/rectum, possibly radiation changes versus ischemia versus infection. --WBC 17.4>30.935.7>29.0>23.3>19.5>17.0>15.8 --Oral vancomycin restarted by general surgery this morning --CBC daily --Continues with  diffuse abdominal pain and multiple bowel movements daily --Infectious disease consulted for further recommendations  Acute on chronic GI bleeding Acute blood loss anemia, POA Supratherapeutic INR, POA Patient reports large bloody bowel movement prior to ED presentation. INR notably elevated at 8.8 on admission. Received oral vitamin K for reversal. Recent hospitalization with finding of duodenal ulcer on EGD. Discharged on Protonix twice daily. EGD on 09/01/2020 with no evidence of active bleeding. Colonoscopy 2/26 with findings consistent with severe radiation changes, proctitis, ischemic vs inflammatory colitis, strictures. --[facility] following, appreciate assistance. --Hgb 8.9>6.8>>8.6>7.0>9.6>8.9>9.7>9.1>7.9>7.4 --s/p 2u pRBC 2/19 and 1 u pRBC 2/24 --Aranesp 33mcg every Sat w/ HD --Continue to monitor hemoglobin daily, transfuse for Hgb <7.0 or active bleeding --CBC in am  Nausea/vomiting Ileus Hx rectal cancer s/p LAR, XRT, chemotherapy No gastric outlet obstruction noted on endoscopy. Colonoscopy notable for anal stenosis with rectal stenosis and ulceration from previous surgery/radiation. CT angiogram abdomen/pelvis with occlusion IMA but patent celiac and SMA with collateral filling. --General surgery following, appreciate assistance --CT abdomen/pelvis today with mild increase perirectal inflammatory change consistent with focal colitis, colon decompressed, stomach decompressed, no small bowel dilation --NPO; on TPN  Chronic atrial fibrillation on Coumadin Supratherapeutic INR INR 8.8 admission, reversed with oral vitamin K. --Continue to hold Coumadin --Continue to monitor on telemetry  ESRD on HD TTS --Nephrology following, appreciate assistance --Continue HD inpatient per nephrology  Suspected B3 deficiency Niacin level 30. Continue niacin replacement  Hypoglycemic episodes Etiology likely secondary to prolonged n.p.o. status.  Now on TPN --continue to monitor  glucose closely  Adult failure to thrive Severe protein calorie malnutrition 2/2 prolonged illness and ileus Body mass index is 17.53 kg/m.  Albumin 1.9.  Oral intake limited to ability to tolerate given nausea/vomiting and C. difficile colitis  with ileus.  --Continues n.p.o. per general surgery --PICC line ordered to initiate TPN today  Weakness/debility/deconditioning: Patient with prolonged hospitalization due to ileus and severe C. difficile colitis.  Prior to hospitalization, patient was fairly independent in regards to his ADLs.  His decline during this hospitalization has also been complicated by his prolonged n.p.o. status. --Continue PT/OT efforts while inpatient with plans of SNF discharge when medically stable  Goals of care: --Palliative care following, appreciate assistance.  Discussed with patient and spouse extensively, very poor surgical candidate if needed and possibly would not survive surgical intervention.  Discussed with spouse consideration of comfort measures/palliative if patient does not respond with conservative measures.  She will discuss with her 2 sons before making a decision.    DVT prophylaxis: SCDs   Code Status: DNR Family Communication: Updated spouse present at bedside this morning  Disposition Plan:  Level of care: Telemetry Medical Status is: Inpatient  Remains inpatient appropriate because:Ongoing diagnostic testing needed not appropriate for outpatient work up, Unsafe d/c plan, IV treatments appropriate due to intensity of illness or inability to take PO and Inpatient level of care appropriate due to severity of illness   Dispo:  Patient From: Home  Planned Disposition: To be determined  Medically stable for discharge: No     Consultants:   [facility]  Nephrology  Palliative care  Infectious disease   Procedures:   EGD  Colonoscopy  Antimicrobials:   Oral vancomycin 2/18 - 2/28; 3/3>>  Metronidazole  2/26 - 3/2    Subjective: Patient seen and examined at bedside, sleeping but easily arousable.  Continues with significant diffuse abdominal pain, unchanged since yesterday.  Continues with multiple bowel movements, mostly nocturnally.  Spouse present at bedside, discussed his continued lack of progress without significant recovery.  Discussed case with general surgery and GI, they would like IDs input regarding his C. difficile colitis.  Spouse to confer with her 2 sons regarding need for a more palliative approach if he does not respond to conservative treatment.  Patient with no other complaints or concerns at this time.  Denies headache, no chest pain, palpitations, no fever/chills/night sweats, no nausea/vomiting.  No acute concerns overnight per nursing staff.  Objective: Vitals:   09/06/20 1141 09/06/20 1200 09/06/20 1230 09/06/20 1300  BP: (!) 170/80 (!) 159/77 (!) 173/81 (!) 152/81  Pulse: 97 98 (!) 101 76  Resp:      Temp:      TempSrc:      SpO2:      Weight:      Height:        Intake/Output Summary (Last 24 hours) at 09/06/2020 1330 Last data filed at 09/06/2020 0300 Gross per 24 hour  Intake 576.32 ml  Output -  Net 576.32 ml   Filed Weights   09/03/20 0647 09/04/20 0725 09/04/20 1032  Weight: 52.3 kg 54 kg 52.5 kg    Examination:  General exam: Appears calm and comfortable, chronically ill/cachectic/frail in appearance Respiratory system: Clear to auscultation. Respiratory effort normal. Oxygenating well on room air Cardiovascular system: S1 & S2 heard, RRR. No JVD, murmurs, rubs, gallops or clicks. No pedal edema. Gastrointestinal system: Abdomen is distended, with mild diffuse tenderness to palpation. No organomegaly or masses felt. Normal bowel sounds heard. Central nervous system: Alert and oriented. No focal neurological deficits. Extremities: Symmetric 5 x 5 power. Skin: No rashes, lesions or ulcers Psychiatry: Judgement and insight appear poor. Mood & affect  appropriate.  Data Reviewed: I have personally reviewed following labs and imaging studies  CBC: Recent Labs  Lab 09/02/20 0343 09/03/20 0053 09/04/20 0124 09/05/20 0508 09/06/20 0524  WBC 19.5* 17.0* 15.8* 19.4* 16.0*  NEUTROABS 18.1* 14.4* 12.8* 16.0* 12.5*  HGB 9.1* 7.9* 7.9* 7.7* 7.4*  HCT 28.3* 25.2* 24.4* 24.8* 23.9*  MCV 85.0 85.4 85.0 87.0 87.9  PLT 318 272 295 296 295   Basic Metabolic Panel: Recent Labs  Lab 09/01/20 0144 09/01/20 0651 09/01/20 2120 09/02/20 0343 09/02/20 0849 09/03/20 0053 09/04/20 0124 09/05/20 0508 09/06/20 0524  NA 134* 132*  --  137  --  135 129* 136 135  K 3.6 4.7  --  3.2*  --  3.1* 3.0* 3.2* 3.2*  CL 93* 94*  --  96*  --  95* 93* 101 102  CO2 24 18*  --  27  --  26 23 25 24   GLUCOSE 136* 108*   < > 88 86 84 105* 93 117*  BUN 40* 44*  --  24*  --  24* 30* 16 31*  CREATININE 6.64* 6.82*  --  4.30*  --  5.17* 6.49* 4.34* 5.61*  CALCIUM 8.7* 8.7*  --  8.4*  --  8.7* 8.5* 8.4* 8.8*  MG 2.1  --   --   --   --   --  1.8 2.1 1.8  PHOS 4.7* 5.1*  --   --   --   --  3.5 1.7* 3.2   < > = values in this interval not displayed.   GFR: Estimated Creatinine Clearance: 8.7 mL/min (A) (by C-G formula based on SCr of 5.61 mg/dL (H)). Liver Function Tests: Recent Labs  Lab 09/02/20 0343 09/03/20 0053 09/04/20 0124 09/05/20 0508 09/06/20 0524  AST 15 12* 12* 12* 10*  ALT 9 9 8 9 9   ALKPHOS 97 94 92 84 73  BILITOT 0.8 0.6 0.6 0.3 0.1*  PROT 5.8* 5.1* 5.1* 4.9* 4.7*  ALBUMIN 2.2* 1.9* 1.9* 1.9* 1.8*   No results for input(s): LIPASE, AMYLASE in the last 168 hours. No results for input(s): AMMONIA in the last 168 hours. Coagulation Profile: No results for input(s): INR, PROTIME in the last 168 hours. Cardiac Enzymes: No results for input(s): CKTOTAL, CKMB, CKMBINDEX, TROPONINI in the last 168 hours. BNP (last 3 results) No results for input(s): PROBNP in the last 8760 hours. HbA1C: No results for input(s): HGBA1C in the last 72  hours. CBG: Recent Labs  Lab 09/05/20 0758 09/05/20 1219 09/05/20 1623 09/06/20 0005 09/06/20 0814  GLUCAP 127* 136* 104* 118* 133*   Lipid Profile: Recent Labs    09/05/20 0508  TRIG 24   Thyroid Function Tests: No results for input(s): TSH, T4TOTAL, FREET4, T3FREE, THYROIDAB in the last 72 hours. Anemia Panel: No results for input(s): VITAMINB12, FOLATE, FERRITIN, TIBC, IRON, RETICCTPCT in the last 72 hours. Sepsis Labs: No results for input(s): PROCALCITON, LATICACIDVEN in the last 168 hours.  No results found for this or any previous visit (from the past 240 hour(s)).       Radiology Studies: CT ABDOMEN PELVIS W CONTRAST  Result Date: 09/05/2020 CLINICAL DATA:  Abdominal pain EXAM: CT ABDOMEN AND PELVIS WITH CONTRAST TECHNIQUE: Multidetector CT imaging of the abdomen and pelvis was performed using the standard protocol following bolus administration of intravenous contrast. CONTRAST:  136mL OMNIPAQUE IOHEXOL 300 MG/ML  SOLN COMPARISON:  08/29/2020 FINDINGS: Lower chest: Mild infiltrate is noted in the left lung base. Minimal pleural effusion is noted  bilaterally. Hepatobiliary: Dependent density is noted within the gallbladder new from the prior exam likely representing vicarious excretion of contrast or mild gallbladder sludge. Scattered small hypodensities are again seen within the liver likely representing cysts but incompletely evaluated on this exam. Pancreas: Unremarkable. No pancreatic ductal dilatation or surrounding inflammatory changes. Spleen: Normal in size without focal abnormality. Adrenals/Urinary Tract: Adrenal glands are within normal limits. Kidneys demonstrate a normal enhancement pattern with multiple bilateral cysts stable in appearance from the prior exam. Some of these are hyperdense in nature consistent with proteinaceous or hemorrhagic cysts. No obstructive changes are seen. The bladder is partially distended. Stomach/Bowel: Colon is predominately  decompressed. Scattered diverticular changes noted without evidence of diverticulitis. The appendix is within normal limits. Stomach is decompressed when compared with the prior exam. No small bowel dilatation is seen. Mild inflammatory change surrounding the rectum is seen, slightly progressed when compared with the prior exam. Previous anastomosis in the small bowel is again identified and stable. Vascular/Lymphatic: Aortic atherosclerosis. No enlarged abdominal or pelvic lymph nodes. Reproductive: Prostate is unremarkable. Other: No abdominal wall hernia or abnormality. No abdominopelvic ascites. Musculoskeletal: Degenerative changes of lumbar spine are noted. Defect is noted within the right rectus muscle consistent with prior ileostomy which has been reversed. IMPRESSION: Mild increased perirectal inflammatory change likely representing some focal colitis. Diverticulosis without diverticulitis. Multiple stable cystic lesions within the kidneys. New mild infiltrate in the left lower lobe. Minimal effusions are noted bilaterally. New dependent density within the gallbladder likely representing sludge or vicarious excretion of contrast. Electronically Signed   By: Inez Catalina M.D.   On: 09/05/2020 11:51        Scheduled Meds: . sodium chloride   Intravenous Once  . Chlorhexidine Gluconate Cloth  6 each Topical Q0600  . Chlorhexidine Gluconate Cloth  6 each Topical Q0600  . [START ON 09/08/2020] darbepoetin (ARANESP) injection - DIALYSIS  200 mcg Intravenous Q Sat-HD  . doxercalciferol  7 mcg Intravenous Q T,Th,Sa-HD  . pantoprazole  40 mg Oral BID  . sodium chloride flush  10-40 mL Intracatheter Q12H  . vancomycin  500 mg Oral Q6H   Continuous Infusions: . sodium chloride    . sodium chloride    . magnesium sulfate bolus IVPB    . potassium chloride 10 mEq (09/06/20 1244)  . TPN ADULT (ION) 65 mL/hr at 09/05/20 1756  . TPN ADULT (ION)       LOS: 13 days    Time spent: 42 minutes spent  on chart review, discussion with nursing staff, consultants, updating family and interview/physical exam; more than 50% of that time was spent in counseling and/or coordination of care.    Eric J [place] DO [facility] Available via Epic secure chat 7am-7pm After these hours, please refer to coverage provider listed on [email or web address] 09/06/2020, 1:30 PM

## 2020-09-06 NOTE — Care Management Important Message (Signed)
Important Message  Patient Details  Name: Jason ABDULLAH Sr. MRN: [id number] Date of Birth: 23-May-1946   Medicare Important Message Given:  Yes  Pt. On Enteric precautions disinfection,left with nurse on unit.     Holli Humbles Smith 09/06/2020, 9:13 AM

## 2020-09-06 NOTE — Progress Notes (Signed)
Patient with nausea zofran given as ordered as needed for nausea. Miciah Covelli, Bettina Gavia RN

## 2020-09-07 DIAGNOSIS — Z7189 Other specified counseling: Secondary | ICD-10-CM | POA: Diagnosis not present

## 2020-09-07 DIAGNOSIS — R1084 Generalized abdominal pain: Secondary | ICD-10-CM | POA: Diagnosis not present

## 2020-09-07 DIAGNOSIS — Z515 Encounter for palliative care: Secondary | ICD-10-CM | POA: Diagnosis not present

## 2020-09-07 DIAGNOSIS — K922 Gastrointestinal hemorrhage, unspecified: Secondary | ICD-10-CM | POA: Diagnosis not present

## 2020-09-07 DIAGNOSIS — K559 Vascular disorder of intestine, unspecified: Secondary | ICD-10-CM | POA: Diagnosis not present

## 2020-09-07 DIAGNOSIS — K2970 Gastritis, unspecified, without bleeding: Secondary | ICD-10-CM | POA: Diagnosis not present

## 2020-09-07 DIAGNOSIS — Z66 Do not resuscitate: Secondary | ICD-10-CM | POA: Diagnosis not present

## 2020-09-07 DIAGNOSIS — K633 Ulcer of intestine: Secondary | ICD-10-CM | POA: Diagnosis not present

## 2020-09-07 DIAGNOSIS — K921 Melena: Secondary | ICD-10-CM | POA: Diagnosis not present

## 2020-09-07 DIAGNOSIS — A0472 Enterocolitis due to Clostridium difficile, not specified as recurrent: Secondary | ICD-10-CM | POA: Diagnosis not present

## 2020-09-07 LAB — GLUCOSE, CAPILLARY
Glucose-Capillary: 116 mg/dL — ABNORMAL HIGH (ref 70–99)
Glucose-Capillary: 116 mg/dL — ABNORMAL HIGH (ref 70–99)
Glucose-Capillary: 116 mg/dL — ABNORMAL HIGH (ref 70–99)
Glucose-Capillary: 130 mg/dL — ABNORMAL HIGH (ref 70–99)
Glucose-Capillary: 18 mg/dL — CL (ref 70–99)
Glucose-Capillary: <10 mg/dL — CL (ref 70–99)

## 2020-09-07 LAB — RENAL FUNCTION PANEL
Albumin: 2 g/dL — ABNORMAL LOW (ref 3.5–5.0)
Anion gap: 9 (ref 5–15)
BUN: 30 mg/dL — ABNORMAL HIGH (ref 8–23)
CO2: 25 mmol/L (ref 22–32)
Calcium: 9.1 mg/dL (ref 8.9–10.3)
Chloride: 99 mmol/L (ref 98–111)
Creatinine, Ser: 4.04 mg/dL — ABNORMAL HIGH (ref 0.61–1.24)
GFR, Estimated: 15 mL/min — ABNORMAL LOW (ref >60)
Glucose, Bld: 120 mg/dL — ABNORMAL HIGH (ref 70–99)
Phosphorus: 3.1 mg/dL (ref 2.5–4.6)
Potassium: 4 mmol/L (ref 3.5–5.1)
Sodium: 133 mmol/L — ABNORMAL LOW (ref 135–145)

## 2020-09-07 LAB — CBC WITH DIFFERENTIAL/PLATELET
Abs Immature Granulocytes: 0 10*3/uL (ref 0.00–0.07)
Basophils Absolute: 0 10*3/uL (ref 0.0–0.1)
Basophils Relative: 0 %
Eosinophils Absolute: 0 10*3/uL (ref 0.0–0.5)
Eosinophils Relative: 0 %
HCT: 22.1 % — ABNORMAL LOW (ref 39.0–52.0)
Hemoglobin: 7.2 g/dL — ABNORMAL LOW (ref 13.0–17.0)
Lymphocytes Relative: 6 %
Lymphs Abs: 0.8 10*3/uL (ref 0.7–4.0)
MCH: 28.3 pg (ref 26.0–34.0)
MCHC: 32.6 g/dL (ref 30.0–36.0)
MCV: 87 fL (ref 80.0–100.0)
Monocytes Absolute: 0.7 10*3/uL (ref 0.1–1.0)
Monocytes Relative: 5 %
Neutro Abs: 11.7 10*3/uL — ABNORMAL HIGH (ref 1.7–7.7)
Neutrophils Relative %: 89 %
Platelets: 234 10*3/uL (ref 150–400)
RBC: 2.54 MIL/uL — ABNORMAL LOW (ref 4.22–5.81)
RDW: 21.2 % — ABNORMAL HIGH (ref 11.5–15.5)
WBC: 13.1 10*3/uL — ABNORMAL HIGH (ref 4.0–10.5)
nRBC: 0 % (ref 0.0–0.2)
nRBC: 0 /100 WBC

## 2020-09-07 LAB — COMPREHENSIVE METABOLIC PANEL
ALT: 9 U/L (ref 0–44)
AST: 13 U/L — ABNORMAL LOW (ref 15–41)
Albumin: 1.8 g/dL — ABNORMAL LOW (ref 3.5–5.0)
Alkaline Phosphatase: 73 U/L (ref 38–126)
Anion gap: 7 (ref 5–15)
BUN: 16 mg/dL (ref 8–23)
CO2: 27 mmol/L (ref 22–32)
Calcium: 8.8 mg/dL — ABNORMAL LOW (ref 8.9–10.3)
Chloride: 101 mmol/L (ref 98–111)
Creatinine, Ser: 3.29 mg/dL — ABNORMAL HIGH (ref 0.61–1.24)
GFR, Estimated: 19 mL/min — ABNORMAL LOW (ref >60)
Glucose, Bld: 106 mg/dL — ABNORMAL HIGH (ref 70–99)
Potassium: 3.1 mmol/L — ABNORMAL LOW (ref 3.5–5.1)
Sodium: 135 mmol/L (ref 135–145)
Total Bilirubin: 0.4 mg/dL (ref 0.3–1.2)
Total Protein: 4.9 g/dL — ABNORMAL LOW (ref 6.5–8.1)

## 2020-09-07 LAB — CBC
HCT: 24.6 % — ABNORMAL LOW (ref 39.0–52.0)
Hemoglobin: 7.6 g/dL — ABNORMAL LOW (ref 13.0–17.0)
MCH: 27.1 pg (ref 26.0–34.0)
MCHC: 30.9 g/dL (ref 30.0–36.0)
MCV: 87.9 fL (ref 80.0–100.0)
Platelets: 245 10*3/uL (ref 150–400)
RBC: 2.8 MIL/uL — ABNORMAL LOW (ref 4.22–5.81)
RDW: 21.3 % — ABNORMAL HIGH (ref 11.5–15.5)
WBC: 11.3 10*3/uL — ABNORMAL HIGH (ref 4.0–10.5)
nRBC: 0.2 % (ref 0.0–0.2)

## 2020-09-07 LAB — PHOSPHORUS: Phosphorus: 1.7 mg/dL — ABNORMAL LOW (ref 2.5–4.6)

## 2020-09-07 LAB — MAGNESIUM: Magnesium: 2.2 mg/dL (ref 1.7–2.4)

## 2020-09-07 MED ORDER — POTASSIUM CHLORIDE 10 MEQ/50ML IV SOLN
10.0000 meq | INTRAVENOUS | Status: AC
  Administered 2020-09-07 (×2): 10 meq via INTRAVENOUS
  Filled 2020-09-07 (×2): qty 50

## 2020-09-07 MED ORDER — SODIUM CHLORIDE 0.9 % IV SOLN: 100.0000 mL | INTRAVENOUS | Status: DC | PRN

## 2020-09-07 MED ORDER — HEPARIN SODIUM (PORCINE) 1000 UNIT/ML DIALYSIS
1000.0000 [IU] | INTRAMUSCULAR | Status: DC | PRN
  Filled 2020-09-07: qty 1

## 2020-09-07 MED ORDER — LIDOCAINE HCL (PF) 1 % IJ SOLN: 5.0000 mL | INTRAMUSCULAR | Status: DC | PRN

## 2020-09-07 MED ORDER — NIACIN ER 250 MG PO CPCR
250.0000 mg | ORAL_CAPSULE | Freq: Two times a day (BID) | ORAL | Status: DC
  Administered 2020-09-07 – 2020-09-14 (×13): 250 mg via ORAL
  Filled 2020-09-07 (×15): qty 1

## 2020-09-07 MED ORDER — ALTEPLASE 2 MG IJ SOLR: 2.0000 mg | Freq: Once | INTRAMUSCULAR | Status: DC | PRN

## 2020-09-07 MED ORDER — TRACE MINERALS CU-MN-SE-ZN 300-55-60-3000 MCG/ML IV SOLN
INTRAVENOUS | Status: AC
  Filled 2020-09-07: qty 676

## 2020-09-07 MED ORDER — CHLORHEXIDINE GLUCONATE CLOTH 2 % EX PADS
6.0000 | MEDICATED_PAD | Freq: Every day | CUTANEOUS | Status: DC
  Administered 2020-09-08 – 2020-09-10 (×3): 6 via TOPICAL

## 2020-09-07 MED ORDER — LIDOCAINE-PRILOCAINE 2.5-2.5 % EX CREA: 1.0000 "application " | TOPICAL_CREAM | CUTANEOUS | Status: DC | PRN

## 2020-09-07 MED ORDER — PENTAFLUOROPROP-TETRAFLUOROETH EX AERO: 1.0000 "application " | INHALATION_SPRAY | CUTANEOUS | Status: DC | PRN

## 2020-09-07 MED ORDER — POTASSIUM PHOSPHATES 15 MMOLE/5ML IV SOLN
30.0000 mmol | Freq: Once | INTRAVENOUS | Status: AC
  Administered 2020-09-07: 30 mmol via INTRAVENOUS
  Filled 2020-09-07: qty 10

## 2020-09-07 NOTE — Progress Notes (Signed)
PROGRESS NOTE    Jason SLEIGHT Sr.  PPI:[id number] DOB: [birth year] DOA: 08/24/2020 PCP: [name], MD    Brief Narrative:  Jason Cotta Sr. is a 75 year old gentleman with past medical history significant for chronic atrial fibrillation on Coumadin, peptic ulcer disease, ESRD on HD TTS, history of rectal cancer status post low anterior resection and reversal of ileostomy with chemotherapy/radiation who presented to the ED with abdominal pain/cramping, nausea/vomiting/diarrhea and rectal bleeding. Patient with follow-up with his GI physician due to recent hospitalization from GI bleed due to duodenal ulcer, patient had repeat labs performed which was notably positive for C. difficile colitis. Patient was started on medication for his C. difficile infection outpatient, but he noticed a large amount of blood in his stool which was concerning which prompted further evaluation in the ED.  In the ED, patient was found to have an elevated INR, he was given oral vitamin K and started on Protonix. [name] was consulted. Hospital service consulted for further evaluation and management of GI bleed with supratherapeutic INR in the setting of C. difficile colitis.   Assessment & Plan:   Active Problems:   Hematochezia   GIB (gastrointestinal bleeding)   C. difficile colitis   Generalized abdominal pain   Non-intractable vomiting   Abdominal distension (gaseous)   Gastritis and gastroduodenitis   Noninfectious gastroenteritis   Colon ulcer   Intestinal occlusion (HCC)   Protein-calorie malnutrition, severe   Ischemic colitis ([name])   C. difficile colitis CT angiogram abdomen/pelvis 2/24 suspicious for ileus, no obstruction. Flexible sigmoidoscopy 09/01/2020 with severely ulcerated mucosa at the sigmoid colon/rectum, possibly radiation changes versus ischemia versus infection. --ID/general surgery/[facility] following --WBC 17.4>30.935.7>29.0>23.3>19.5>17.0>15.8>13.1 --Difcid 200mg  PO BID x  10 das --CBC daily  Acute on chronic GI bleeding Acute blood loss anemia, POA Supratherapeutic INR, POA Patient reports large bloody bowel movement prior to ED presentation. INR notably elevated at 8.8 on admission. Received oral vitamin K for reversal. Recent hospitalization with finding of duodenal ulcer on EGD. Discharged on Protonix twice daily. EGD on 09/01/2020 with no evidence of active bleeding. Colonoscopy 2/26 with findings consistent with severe radiation changes, proctitis, ischemic vs inflammatory colitis, strictures. --[facility] following, appreciate assistance. --Hgb 8.9>6.8>>8.6>7.0>9.6>8.9>9.7>9.1>7.9>7.4>7.2 --s/p 2u pRBC 2/19 and 1 u pRBC 2/24 --Aranesp 90mcg every Sat w/ HD --Continue to monitor hemoglobin daily, transfuse for Hgb <7.0 or active bleeding --CBC in am  Nausea/vomiting Ileus Hx rectal cancer s/p LAR, XRT, chemotherapy No gastric outlet obstruction noted on endoscopy. Colonoscopy notable for anal stenosis with rectal stenosis and ulceration from previous surgery/radiation. CT angiogram abdomen/pelvis with occlusion IMA but patent celiac and SMA with collateral filling. --General surgery following, appreciate assistance --CT abdomen/pelvis today with mild increase perirectal inflammatory change consistent with focal colitis, colon decompressed, stomach decompressed, no small bowel dilation --CLD; on TPN; further advancement of diet per general surgery  Chronic atrial fibrillation on Coumadin Supratherapeutic INR INR 8.8 admission, reversed with oral vitamin K. --Continue to hold Coumadin --Continue to monitor on telemetry  ESRD on HD TTS --Nephrology following, appreciate assistance --Continue HD inpatient per nephrology  Hypoglycemic episodes Etiology likely secondary to prolonged n.p.o. status.  Now on TPN --continue to monitor glucose closely  Adult failure to thrive Severe protein calorie malnutrition 2/2 prolonged illness and ileus Body mass  index is 17.53 kg/m.  Albumin 1.9.  Oral intake limited to ability to tolerate given nausea/vomiting and C. difficile colitis with ileus.  --Continues clear liquid diet without further advancement per  general surgery --Continue TPN  Weakness/debility/deconditioning: Patient with prolonged hospitalization due to ileus and severe C. difficile colitis.  Prior to hospitalization, patient was fairly independent in regards to his ADLs.  His decline during this hospitalization has also been complicated by his prolonged n.p.o. status. --Continue PT/OT efforts while inpatient with plans of SNF discharge when medically stable  Goals of care: --Palliative care following, appreciate assistance.  Discussed with patient and spouse extensively, very poor surgical candidate if needed and possibly would not survive surgical intervention.  Discussed with spouse consideration of comfort measures/palliative if patient does not respond with conservative measures.  She will discuss with her 2 sons before making a decision. --Palliative care assisting with coordination of family meeting, hopefully this weekend    DVT prophylaxis: SCDs   Code Status: DNR Family Communication: Updated spouse present at bedside this morning  Disposition Plan:  Level of care: Telemetry Medical Status is: Inpatient  Remains inpatient appropriate because:Ongoing diagnostic testing needed not appropriate for outpatient work up, Unsafe d/c plan, IV treatments appropriate due to intensity of illness or inability to take PO and Inpatient level of care appropriate due to severity of illness   Dispo:  Patient From: Home  Planned Disposition: To be determined  Medically stable for discharge: No     Consultants:   [facility]  Nephrology  Palliative care  Infectious disease   Procedures:   EGD  Colonoscopy  Antimicrobials:   Oral vancomycin 2/18 - 2/28; 3/3>>  Metronidazole 2/26 -  3/2    Subjective: Patient seen and examined at bedside, resting comfortably.  No family present.  Reports his abdominal pain is slightly better today, although on physical exam he continues with exquisite generalized tenderness with just mild touch.  GI present this morning.  Started on Dificid yesterday for C. difficile colitis.  Unfortunately continues with very poor prognosis given his lack of significant change with medical treatment.  Palliative care assisting with coordination of family meeting to discuss further goals of care moving forward.  Patient with no other further complaints or concerns at this time.  Continues with frequent bowel movements, not significantly changed over the past day. Denies headache, no chest pain, palpitations, no fever/chills/night sweats, no nausea/vomiting.  No acute concerns overnight per nursing staff.  Objective: Vitals:   09/07/20 0036 09/07/20 0355 09/07/20 0819 09/07/20 1306  BP: (!) 163/71 140/71 (!) 152/79 (!) 167/72  Pulse: 86 91 94 95  Resp: 11 16 12 16   Temp:  98 F (36.7 C) 98.1 F (36.7 C) 97.6 F (36.4 C)  TempSrc:  Oral Oral Oral  SpO2: 98% 100% 100% 99%  Weight:      Height:        Intake/Output Summary (Last 24 hours) at 09/07/2020 1342 Last data filed at 09/07/2020 0400 Gross per 24 hour  Intake 2091.97 ml  Output 2300 ml  Net -208.03 ml   Filed Weights   09/04/20 0725 09/04/20 1032  Weight: 54 kg 52.5 kg    Examination:  General exam: Appears calm and comfortable, chronically ill/cachectic/frail in appearance Respiratory system: Clear to auscultation. Respiratory effort normal. Oxygenating well on room air Cardiovascular system: S1 & S2 heard, RRR. No JVD, murmurs, rubs, gallops or clicks. No pedal edema. Gastrointestinal system: Abdomen is distended, with mild diffuse tenderness to palpation. No organomegaly or masses felt. Normal bowel sounds heard. Central nervous system: Alert and oriented. No focal neurological  deficits. Extremities: Symmetric 5 x 5 power. Skin: No rashes,  lesions or ulcers Psychiatry: Judgement and insight appear poor. Mood & affect appropriate.     Data Reviewed: I have personally reviewed following labs and imaging studies  CBC: Recent Labs  Lab 09/03/20 0053 09/04/20 0124 09/05/20 0508 09/06/20 0524 09/07/20 0046  WBC 17.0* 15.8* 19.4* 16.0* 13.1*  NEUTROABS 14.4* 12.8* 16.0* 12.5* 11.7*  HGB 7.9* 7.9* 7.7* 7.4* 7.2*  HCT 25.2* 24.4* 24.8* 23.9* 22.1*  MCV 85.4 85.0 87.0 87.9 87.0  PLT 272 295 296 252 644   Basic Metabolic Panel: Recent Labs  Lab 09/01/20 0144 09/01/20 0651 09/01/20 2120 09/03/20 0053 09/04/20 0124 09/05/20 0508 09/06/20 0524 09/07/20 0046  NA 134* 132*   < > 135 129* 136 135 135  K 3.6 4.7   < > 3.1* 3.0* 3.2* 3.2* 3.1*  CL 93* 94*   < > 95* 93* 101 102 101  CO2 24 18*   < > 26 23 25 24 27   GLUCOSE 136* 108*   < > 84 105* 93 117* 106*  BUN 40* 44*   < > 24* 30* 16 31* 16  CREATININE 6.64* 6.82*   < > 5.17* 6.49* 4.34* 5.61* 3.29*  CALCIUM 8.7* 8.7*   < > 8.7* 8.5* 8.4* 8.8* 8.8*  MG 2.1  --   --   --  1.8 2.1 1.8 2.2  PHOS 4.7* 5.1*  --   --  3.5 1.7* 3.2 1.7*   < > = values in this interval not displayed.   GFR: Estimated Creatinine Clearance: 14.8 mL/min (A) (by C-G formula based on SCr of 3.29 mg/dL (H)). Liver Function Tests: Recent Labs  Lab 09/03/20 0053 09/04/20 0124 09/05/20 0508 09/06/20 0524 09/07/20 0046  AST 12* 12* 12* 10* 13*  ALT 9 8 9 9 9   ALKPHOS 94 92 84 73 73  BILITOT 0.6 0.6 0.3 0.1* 0.4  PROT 5.1* 5.1* 4.9* 4.7* 4.9*  ALBUMIN 1.9* 1.9* 1.9* 1.8* 1.8*   No results for input(s): LIPASE, AMYLASE in the last 168 hours. No results for input(s): AMMONIA in the last 168 hours. Coagulation Profile: No results for input(s): INR, PROTIME in the last 168 hours. Cardiac Enzymes: No results for input(s): CKTOTAL, CKMB, CKMBINDEX, TROPONINI in the last 168 hours. BNP (last 3 results) No results for input(s):  PROBNP in the last 8760 hours. HbA1C: No results for input(s): HGBA1C in the last 72 hours. CBG: Recent Labs  Lab 09/06/20 1551 09/07/20 0038 09/07/20 0353 09/07/20 0817 09/07/20 1302  GLUCAP 144* 116* 116* 116* 130*   Lipid Profile: Recent Labs    09/05/20 0508  TRIG 24   Thyroid Function Tests: No results for input(s): TSH, T4TOTAL, FREET4, T3FREE, THYROIDAB in the last 72 hours. Anemia Panel: No results for input(s): VITAMINB12, FOLATE, FERRITIN, TIBC, IRON, RETICCTPCT in the last 72 hours. Sepsis Labs: No results for input(s): PROCALCITON, LATICACIDVEN in the last 168 hours.  No results found for this or any previous visit (from the past 240 hour(s)).       Radiology Studies: No results found.      Scheduled Meds: . sodium chloride   Intravenous Once  . Chlorhexidine Gluconate Cloth  6 each Topical Q0600  . Chlorhexidine Gluconate Cloth  6 each Topical Q0600  . [START ON 09/08/2020] Chlorhexidine Gluconate Cloth  6 each Topical Q0600  . [START ON 09/08/2020] darbepoetin (ARANESP) injection - DIALYSIS  200 mcg Intravenous Q Sat-HD  . doxercalciferol  7 mcg Intravenous Q T,Th,Sa-HD  . fidaxomicin  200 mg  Oral BID  . pantoprazole  40 mg Oral BID  . sodium chloride flush  10-40 mL Intracatheter Q12H   Continuous Infusions: . sodium chloride    . sodium chloride    . potassium PHOSPHATE IVPB (in mmol) 30 mmol (09/07/20 0947)  . TPN ADULT (ION) 65 mL/hr at 09/06/20 1900  . TPN ADULT (ION)       LOS: 14 days    Time spent: 40 minutes spent on chart review, discussion with nursing staff, consultants, updating family and interview/physical exam; more than 50% of that time was spent in counseling and/or coordination of care.    Prestyn Stanco J [place] DO [facility] Available via Epic secure chat 7am-7pm After these hours, please refer to coverage provider listed on [email or web address] 09/07/2020, 1:42 PM

## 2020-09-07 NOTE — Progress Notes (Signed)
Patient transferred to 5Y09.  Report given to receiving RN.

## 2020-09-07 NOTE — Progress Notes (Signed)
Patient arrived to unit via bed. He is a&o x4. VSS. Respirations even and unlabored. No s/s of distress. Denies any needs at this time. Call light and possessions in reac.

## 2020-09-07 NOTE — TOC Benefit Eligibility Note (Signed)
Patient Advocate Encounter  Patient is approved through the [employer] Patient Assistance Program for Dificid through 07/06/2021.   Medication will be mailed to Patient's home.   Lyndel Safe, [place] Patient Advocate Specialist [facility]wardship Team Direct Number: [phone]  Fax: [phone]

## 2020-09-07 NOTE — Progress Notes (Signed)
Nutrition Follow-up  DOCUMENTATION CODES:   Severe malnutrition in context of chronic illness,Underweight  INTERVENTION:   -continue TPN to meet 100% of nutritional needs  Pt at high refeeding risk; recommend monitor potassium, magnesium, and phosphorus labs Q12 until stable.   Concerned that given significant drop in potassium and phosphorus, pt may be currently refeeding from initiation of TPN 3/01.   -Recommendation to reinstate Niacin supplementation.   NUTRITION DIAGNOSIS:   Severe Malnutrition related to chronic illness (ESRD, COPD chronic in nature exascerbated by acute GI related illness) as evidenced by severe fat depletion,severe muscle depletion,moderate muscle depletion,moderate fat depletion,energy intake < 75% for > or equal to 1 month.  Ongoing.  GOAL:   Patient will meet greater than or equal to 90% of their needs  Met with TPN.   MONITOR:   Labs,Weight trends,Skin,Diet advancement  REASON FOR ASSESSMENT:   Consult New TPN/TNA  ASSESSMENT:   84 YOM admitted for gastrointestinal bleeding. PMH of HTN, diverticulosis, anemia, COPD, rectal ca s/p XRT and LAR w/ diverting loop ileostomy with known rectal stricture, CVA, A. Fib, PUD. Ongoing ESRD on HD TTS, MBD CKD, C. Diff., B3 deficiency, severe esophagitis, n/v/diarrhea, ileus. Potential for sigmoid resection and possible total abdominal colectomy, permanent colostomy vs ileostomy if condition does not improve.  3/03 - HD  Per chart, no meal documentation noted since 2/26. Pt is currently on TPN feeding. Pt reports that he is tolerating his TPN feedings as well as the clear liquids he's been receiving. Pt reports that he is still having diarrhea, however it has not been exacerbated since starting his TPN or clear liquids.   No weight updates since prior visit. However, due to noted weight loss in previous note pt's EDW may need to be adjusted.   Intern and RD repeated pt's NFPE to assess for potential  micronutrient deficiencies. Pt presented with a rash on his left chest that may be consistent with a B3 deficiency, which is noted in pt's history. Recommendation to reinstate Niacin supplementation.   Meds Reviewed: Hectorol (TTS) Labs Reviewed: Potassium (3.1, low), Corrected Calcium (10.56), Phosphorus (1.7, low)   NUTRITION - FOCUSED PHYSICAL EXAM:  Flowsheet Row Most Recent Value  Orbital Region Moderate depletion  Upper Arm Region Moderate depletion  Thoracic and Lumbar Region Severe depletion  Buccal Region Severe depletion  Temple Region Severe depletion  Clavicle Bone Region Moderate depletion  Clavicle and Acromion Bone Region Moderate depletion  Scapular Bone Region Moderate depletion  Dorsal Hand Moderate depletion  Patellar Region Severe depletion  Anterior Thigh Region Severe depletion  Posterior Calf Region Severe depletion  Edema (RD Assessment) None  Hair Reviewed  Eyes Reviewed  Mouth Reviewed  [pale, large tongue]  Skin Reviewed  Lehman Prom on left chest]  Nails Reviewed       Diet Order:   Diet Order            Diet clear liquid Room service appropriate? Yes; Fluid consistency: Thin  Diet effective now                 EDUCATION NEEDS:   No education needs have been identified at this time  Skin:  Skin Assessment: Skin Integrity Issues: Skin Integrity Issues:: Incisions Incisions: open, R buttocks  Last BM:  3/04  Height:   Ht Readings from Last 1 Encounters:  08/26/20 $RemoveB'5\' 8"'ISdPivcX$  (1.727 m)    Weight:   Wt Readings from Last 1 Encounters:  08/19/20 57.6 kg    Ideal Body Weight:  70 kg  BMI:  Body mass index is 17.6 kg/m.  Estimated Nutritional Needs:   Kcal:  1900-2100  Protein:  100-115 g  Fluid:  1L + UOP    Salvadore Oxford, Dietetic Intern 09/07/2020 2:01 PM

## 2020-09-07 NOTE — Progress Notes (Addendum)
Physical Therapy Treatment Patient Details Name: Jason DREES Sr. MRN: [id number] DOB: 24-Dec-1945 Today's Date: 09/07/2020    History of Present Illness Pt is a 75 y.o. male recently admitted 08/15/20-08/19/20 with upper GIB and duodenal ulcer, now readmitted 08/24/20 with C. difficile collitis, lower GIB. Transfer 4E to 6N on 3/4. PMH includes afib on Coumadin, peptic ulcer disease, rectal CA, ESRD (HD TTS), CVA, gout, COPD, s/p partial lung lobectomy (2017).    PT Comments    Pt received in supine, agreeable to limited supine session after significant encouragement, with fair participation and poor tolerance for mobility. Pt performed supine BLE AROM therapeutic exercises with fair tolerance and able to roll to L/R with min guard at most and performed supine scoot to reposition in bed with minA and use of overhead rail and BLE. Encouraged EOB/OOB mobility next date for strengthening, pt will likely require premedication for optimal participation. Of note family meeting planned for Monday evening (per chart review) for goals of care. Pt continues to benefit from PT services to progress toward functional mobility goals. Continue to recommend SNF.  Follow Up Recommendations  SNF     Equipment Recommendations  3in1 (PT)    Recommendations for Other Services       Precautions / Restrictions Precautions Precautions: Fall Precaution Comments: WBAT in RLE in CAM boot since last admission Required Braces or Orthoses: Other Brace Other Brace: CAM walker boot Restrictions Weight Bearing Restrictions: Yes RLE Weight Bearing: Weight bearing as tolerated Other Position/Activity Restrictions: WBAT in CAM boot    Mobility  Bed Mobility Overal bed mobility: Needs Assistance Bed Mobility: Rolling Rolling: Min guard         General bed mobility comments: pt rolled to L/R for repositioning in bed and performed posterior supine scoot with HOB flat and able to reach overhead rails, cues to push with  BLE, needed minA with transfer pad to assist    Transfers                 General transfer comment: pt refused due to abdominal pain  Ambulation/Gait                 Stairs             Wheelchair Mobility    Modified Rankin (Stroke Patients Only)       Balance                                            Cognition Arousal/Alertness: Awake/alert Behavior During Therapy: Flat affect Overall Cognitive Status: Impaired/Different from baseline Area of Impairment: Safety/judgement;Problem solving;Awareness;Attention                         Safety/Judgement: Decreased awareness of deficits Awareness: Emergent Problem Solving: Slow processing;Decreased initiation;Requires verbal cues General Comments: requires increased time and cueing for mobility, participation limited due to severe reported abdominal pain; repositioned in bed but still declining EOB/OOB mobility      Exercises General Exercises - Lower Extremity Ankle Circles/Pumps: AROM;Both;10 reps;Supine Short Arc Quad: AROM;Both;10 reps;Supine Heel Slides: AROM;Both;10 reps;Supine Hip ABduction/ADduction: AROM;Both;10 reps;Supine    General Comments General comments (skin integrity, edema, etc.): VSS on RA, deferred BP as no EOB transfer and pt denies nausea/dizziness; pt instructed on pressure offloading techniques/frequency but needs reinforcement; in air bed      Pertinent  Vitals/Pain Pain Assessment: 0-10 Pain Score: 8  Faces Pain Scale: Hurts whole lot Pain Location: abdomen Pain Descriptors / Indicators: Guarding;Sharp;Stabbing;Grimacing;Crying Pain Intervention(s): Monitored during session;Premedicated before session;Repositioned (per RN pt had dilaudid ~10 mins prior to session)    Home Living                      Prior Function            PT Goals (current goals can now be found in the care plan section) Acute Rehab PT Goals Patient Stated  Goal: to decrease abdominal pain and get stronger PT Goal Formulation: With patient Time For Goal Achievement: 09/12/20 Potential to Achieve Goals: Good Progress towards PT goals: Not progressing toward goals - comment (abdominal pain limiting)    Frequency    Min 2X/week      PT Plan Current plan remains appropriate    Co-evaluation              AM-PAC PT "6 Clicks" Mobility   Outcome Measure  Help needed turning from your back to your side while in a flat bed without using bedrails?: A Little Help needed moving from lying on your back to sitting on the side of a flat bed without using bedrails?: A Little Help needed moving to and from a bed to a chair (including a wheelchair)?: A Little Help needed standing up from a chair using your arms (e.g., wheelchair or bedside chair)?: A Little Help needed to walk in hospital room?: A Lot Help needed climbing 3-5 steps with a railing? : Total 6 Click Score: 15    End of Session Equipment Utilized During Treatment: Gait belt Activity Tolerance: Patient limited by pain Patient left: in bed;with call bell/phone within reach;with bed alarm set (x2 warm blankets provided for him at end of session) Nurse Communication: Mobility status;Other (comment) (pt requesting apple juice) PT Visit Diagnosis: Unsteadiness on feet (R26.81);Difficulty in walking, not elsewhere classified (R26.2);Muscle weakness (generalized) (M62.81) Pain - Right/Left: Right Pain - part of body: Leg     Time: 9794-8016 PT Time Calculation (min) (ACUTE ONLY): 15 min  Charges:  $Therapeutic Exercise: 8-22 mins                     Carly P., PTA Acute Rehabilitation Services Pager: [phone] Office: [place] 09/07/2020, 4:50 PM

## 2020-09-07 NOTE — Progress Notes (Addendum)
Daily Rounding Note  09/07/2020, 8:26 AM  LOS: 14 days   SUBJECTIVE:   Chief complaint:   Abdominal pain, diarrhea, ? c diff.     Diarrhea and abd pain persist.  Tolerating clear sips.   Pt says pain is better but still w TTP on exam  OBJECTIVE:         Vital signs in last 24 hours:    Temp:  [98 F (36.7 C)-98.6 F (37 C)] 98.1 F (36.7 C) (03/04 0819) Pulse Rate:  [76-109] 94 (03/04 0819) Resp:  [11-17] 12 (03/04 0819) BP: (138-173)/(66-88) 152/79 (03/04 0819) SpO2:  [97 %-100 %] 100 % (03/04 0819) Last BM Date: 09/07/20 Filed Weights   09/04/20 0725 09/04/20 1032  Weight: 54 kg 52.5 kg   General: ill looking, frail.  Looks old for age   Heart: RRR Chest: clear but diminished BS Abdomen: diffusely tender w light pressure.  BS present but hypoactive  Extremities: thin, + sarcopenia Neuro/Psych:  Appropriate.  Withdrawn.  No tremors.  Moves all 4 limbs.    Intake/Output from previous day: 03/03 0701 - 03/04 0700 In: 2092 [I.V.:1592.1; IV Piggyback:499.9] Out: 2300 [Urine:300]  Intake/Output this shift: No intake/output data recorded.  Lab Results: Recent Labs    09/05/20 0508 09/06/20 0524 09/07/20 0046  WBC 19.4* 16.0* 13.1*  HGB 7.7* 7.4* 7.2*  HCT 24.8* 23.9* 22.1*  PLT 296 252 234   BMET Recent Labs    09/05/20 0508 09/06/20 0524 09/07/20 0046  NA 136 135 135  K 3.2* 3.2* 3.1*  CL 101 102 101  CO2 25 24 27   GLUCOSE 93 117* 106*  BUN 16 31* 16  CREATININE 4.34* 5.61* 3.29*  CALCIUM 8.4* 8.8* 8.8*   LFT Recent Labs    09/05/20 0508 09/06/20 0524 09/07/20 0046  PROT 4.9* 4.7* 4.9*  ALBUMIN 1.9* 1.8* 1.8*  AST 12* 10* 13*  ALT 9 9 9   ALKPHOS 84 73 73  BILITOT 0.3 0.1* 0.4   PT/INR No results for input(s): LABPROT, INR in the last 72 hours. Hepatitis Panel No results for input(s): HEPBSAG, HCVAB, HEPAIGM, HEPBIGM in the last 72 hours.  Studies/Results: CT ABDOMEN PELVIS W  CONTRAST  Result Date: 09/05/2020 CLINICAL DATA:  Abdominal pain EXAM: CT ABDOMEN AND PELVIS WITH CONTRAST TECHNIQUE: Multidetector CT imaging of the abdomen and pelvis was performed using the standard protocol following bolus administration of intravenous contrast. CONTRAST:  169mL OMNIPAQUE IOHEXOL 300 MG/ML  SOLN COMPARISON:  08/29/2020 FINDINGS: Lower chest: Mild infiltrate is noted in the left lung base. Minimal pleural effusion is noted bilaterally. Hepatobiliary: Dependent density is noted within the gallbladder new from the prior exam likely representing vicarious excretion of contrast or mild gallbladder sludge. Scattered small hypodensities are again seen within the liver likely representing cysts but incompletely evaluated on this exam. Pancreas: Unremarkable. No pancreatic ductal dilatation or surrounding inflammatory changes. Spleen: Normal in size without focal abnormality. Adrenals/Urinary Tract: Adrenal glands are within normal limits. Kidneys demonstrate a normal enhancement pattern with multiple bilateral cysts stable in appearance from the prior exam. Some of these are hyperdense in nature consistent with proteinaceous or hemorrhagic cysts. No obstructive changes are seen. The bladder is partially distended. Stomach/Bowel: Colon is predominately decompressed. Scattered diverticular changes noted without evidence of diverticulitis. The appendix is within normal limits. Stomach is decompressed when compared with the prior exam. No small bowel dilatation is seen. Mild inflammatory change surrounding the rectum is seen,  slightly progressed when compared with the prior exam. Previous anastomosis in the small bowel is again identified and stable. Vascular/Lymphatic: Aortic atherosclerosis. No enlarged abdominal or pelvic lymph nodes. Reproductive: Prostate is unremarkable. Other: No abdominal wall hernia or abnormality. No abdominopelvic ascites. Musculoskeletal: Degenerative changes of lumbar spine  are noted. Defect is noted within the right rectus muscle consistent with prior ileostomy which has been reversed. IMPRESSION: Mild increased perirectal inflammatory change likely representing some focal colitis. Diverticulosis without diverticulitis. Multiple stable cystic lesions within the kidneys. New mild infiltrate in the left lower lobe. Minimal effusions are noted bilaterally. New dependent density within the gallbladder likely representing sludge or vicarious excretion of contrast. Electronically Signed   By: Inez Catalina M.D.   On: 09/05/2020 11:51   Scheduled Meds: . sodium chloride   Intravenous Once  . Chlorhexidine Gluconate Cloth  6 each Topical Q0600  . Chlorhexidine Gluconate Cloth  6 each Topical Q0600  . [START ON 09/08/2020] darbepoetin (ARANESP) injection - DIALYSIS  200 mcg Intravenous Q Sat-HD  . doxercalciferol  7 mcg Intravenous Q T,Th,Sa-HD  . fidaxomicin  200 mg Oral BID  . pantoprazole  40 mg Oral BID  . sodium chloride flush  10-40 mL Intracatheter Q12H   Continuous Infusions: . sodium chloride    . sodium chloride    . potassium chloride    . potassium PHOSPHATE IVPB (in mmol)    . TPN ADULT (ION) 65 mL/hr at 09/06/20 1900   PRN Meds:.sodium chloride, sodium chloride, acetaminophen **OR** acetaminophen, alteplase, heparin, HYDROmorphone (DILAUDID) injection, hyoscyamine, lidocaine (PF), lidocaine-prilocaine, lip balm, metoprolol tartrate, ondansetron (ZOFRAN) IV, pentafluoroprop-tetrafluoroeth, promethazine, sodium chloride flush   ASSESMENT:   *  Diarrhea, abdominal pain.  C diff toxin and GDH +, completed treatement w standard vanc later accelerated to higher dose and w IV Flagyl.  Now day 1-2 Dificid per ID rec.   Likely ischemic ulceration and fibrosis on 2/26 Flex sig.   2/26 EGD: Esophagitis, gastritis, retained gastric fluid.  Path: reactive gastropathy. 09/05/2020 CTAP w contrast: Mild perirectal inflammation/focal colitis, aortic atherosclerosis. WBC  count improving 19.4 >> 13.1 over last 2 days.    *   A fib.  Supratherapeutic INR at admission.  Chronic coumadin on hold  *   ESRD  *   Anemia of CKD.  Weekly Aranesp.  Hgb low of 6.8.  S/p 3 PRBCs 2/19 - 2/24.  Hgb 7.2 reflecting progressive decline.    *   Poor po intake.  Severe malnutrition of chronic and acute illness per RD. On clears. TPN in place as of 3/1.     PLAN   *   Watch for clinical response to Dificid.  Normally C diff is not a/w abd pain.      Jason [name]  09/07/2020, 8:26 AM Phone [phone]    [facility] Attending   I have taken an interval history, reviewed the chart and examined the patient. I agree with the Advanced Practitioner's note, impression and recommendations.     Agree w/ goals of care meeting    Prognosis remains poor  Gatha Mayer, MD, [facility] 09/07/2020 1:18 PM

## 2020-09-07 NOTE — Progress Notes (Signed)
[facility] for Infectious Disease  Date of Admission:  08/24/2020      Total days of antibiotics   Dificid 3/3 started           ASSESSMENT: Jason Cotta Sr. is a 75 y.o. male with chronic history of diarrhea acutely worsened over the last 1 month. Admitted following +CDiff testing and blood per rectum. On TPN with ischemic colitis pattern seen on pathology. S/P 10 day course of vancomycin PO with ongoing symptoms of watery diarrhea and pain - not clear if the vomiting precluded full treatment benefit vs patient failed clinically vs ischemic process now leading factor. Hard to tell with confounding factors. WBC trending down, repeated CT scans with some improved features. Now on Dificid course ([place] approved) x 10 days. He is not having any problems with vomiting now fortunately.    Surgery and GI following closely. Would be a very high risk surgical candidate.     PLAN: 1. Continue dificid x 10 days  2. Rest of care per GI / Surgery    Dr. Linus Salmons is available over the weekend for questions.    Active Problems:   Hematochezia   GIB (gastrointestinal bleeding)   C. difficile colitis   Generalized abdominal pain   Non-intractable vomiting   Abdominal distension (gaseous)   Gastritis and gastroduodenitis   Noninfectious gastroenteritis   Colon ulcer   Intestinal occlusion (HCC)   Protein-calorie malnutrition, severe   Ischemic colitis ([place])   . sodium chloride   Intravenous Once  . Chlorhexidine Gluconate Cloth  6 each Topical Q0600  . Chlorhexidine Gluconate Cloth  6 each Topical Q0600  . [START ON 09/08/2020] darbepoetin (ARANESP) injection - DIALYSIS  200 mcg Intravenous Q Sat-HD  . doxercalciferol  7 mcg Intravenous Q T,Th,Sa-HD  . fidaxomicin  200 mg Oral BID  . pantoprazole  40 mg Oral BID  . sodium chloride flush  10-40 mL Intracatheter Q12H    SUBJECTIVE: Thinks he feels a little better than yesterday. Hopeful the new medication will work.  No new  concerns.  Taking some sips of liquids and enjoyed apple juice today. No vomiting. States he had 3 liquid BMs yesterday into last PM.    Review of Systems: Review of Systems  Constitutional: Negative for chills and fever.  Respiratory: Negative for cough.   Cardiovascular: Negative for chest pain.  Gastrointestinal: Positive for abdominal pain, diarrhea and nausea. Negative for vomiting.  Skin: Negative for rash.    Allergies  Allergen Reactions  . Nsaids Other (See Comments)    Asked by surgeon to add this medication class as intolerance due to patients renal insufficiency.  . Lactase Diarrhea  . Lactose Intolerance (Gi) Diarrhea  . Phentermine Other (See Comments)    Unknown reaction  . Amlodipine Other (See Comments)    Edema     OBJECTIVE: Vitals:   09/07/20 0009 09/07/20 0036 09/07/20 0355 09/07/20 0819  BP: (!) 171/77 (!) 163/71 140/71 (!) 152/79  Pulse: 99 86 91 94  Resp: 16 11 16 12   Temp: 98.5 F (36.9 C)  98 F (36.7 C) 98.1 F (36.7 C)  TempSrc: Oral  Oral Oral  SpO2: 100% 98% 100% 100%  Weight:      Height:       Body mass index is 17.6 kg/m.  Physical Exam Constitutional:      Appearance: Normal appearance. He is ill-appearing.     Comments: Resting in bed, moderately  uncomfortable  HENT:     Mouth/Throat:     Mouth: Mucous membranes are moist.     Pharynx: Oropharynx is clear.  Cardiovascular:     Rate and Rhythm: Normal rate and regular rhythm.  Pulmonary:     Effort: Pulmonary effort is normal.     Breath sounds: Normal breath sounds.  Abdominal:     Palpations: Abdomen is soft.     Tenderness: There is abdominal tenderness.  Skin:    General: Skin is warm and dry.  Neurological:     Mental Status: He is alert and oriented to person, place, and time.     Lab Results Lab Results  Component Value Date   WBC 13.1 (H) 09/07/2020   HGB 7.2 (L) 09/07/2020   HCT 22.1 (L) 09/07/2020   MCV 87.0 09/07/2020   PLT 234 09/07/2020    Lab  Results  Component Value Date   CREATININE 3.29 (H) 09/07/2020   BUN 16 09/07/2020   NA 135 09/07/2020   K 3.1 (L) 09/07/2020   CL 101 09/07/2020   CO2 27 09/07/2020    Lab Results  Component Value Date   ALT 9 09/07/2020   AST 13 (L) 09/07/2020   ALKPHOS 73 09/07/2020   BILITOT 0.4 09/07/2020     Microbiology: No results found for this or any previous visit (from the past 240 hour(s)).   Janene Madeira, MSN, NP-C [facility] for Infectious Disease [email or web address] Pager: [phone] Office: [place]: [phone]

## 2020-09-07 NOTE — Progress Notes (Signed)
Progress Note  6 Days Post-Op  Subjective: Patient reports abdominal pain may be a little better. Still having some nausea. 2-3 BM recorded but patient reports it was actually more like 5-6 in last 24 hrs.   Objective: Vital signs in last 24 hours: Temp:  [98 F (36.7 C)-98.6 F (37 C)] 98.1 F (36.7 C) (03/04 0819) Pulse Rate:  [76-109] 94 (03/04 0819) Resp:  [11-17] 12 (03/04 0819) BP: (138-173)/(66-88) 152/79 (03/04 0819) SpO2:  [97 %-100 %] 100 % (03/04 0819) Last BM Date: 09/07/20  Intake/Output from previous day: 03/03 0701 - 03/04 0700 In: 2092 [I.V.:1592.1; IV Piggyback:499.9] Out: 2300 [Urine:300] Intake/Output this shift: No intake/output data recorded.  PE: General: pleasant, WD,cachecticmale who is laying in bed in NAD Heart: regular, rate, and rhythm.  Lungs: CTAB, no wheezes, rhonchi, or rales noted. Respiratory effort nonlabored Abd: soft,mild generalized ttp without peritonitis or guarding, ND, +BS, no masses, hernias, or organomegaly MS: all 4 extremities are symmetrical with no cyanosis, clubbing, or edema. Skin: warm and dry with no masses, lesions, or rashes Psych: A&Ox3 with an appropriate affect.   Lab Results:  Recent Labs    09/06/20 0524 09/07/20 0046  WBC 16.0* 13.1*  HGB 7.4* 7.2*  HCT 23.9* 22.1*  PLT 252 234   BMET Recent Labs    09/06/20 0524 09/07/20 0046  NA 135 135  K 3.2* 3.1*  CL 102 101  CO2 24 27  GLUCOSE 117* 106*  BUN 31* 16  CREATININE 5.61* 3.29*  CALCIUM 8.8* 8.8*   PT/INR No results for input(s): LABPROT, INR in the last 72 hours. CMP     Component Value Date/Time   NA 135 09/07/2020 0046   NA 140 02/24/2017 0000   NA 138 02/20/2014 1432   K 3.1 (L) 09/07/2020 0046   K 3.5 02/20/2014 1432   CL 101 09/07/2020 0046   CL 101 12/09/2012 1048   CO2 27 09/07/2020 0046   CO2 30 (H) 02/20/2014 1432   GLUCOSE 106 (H) 09/07/2020 0046   GLUCOSE 93 02/20/2014 1432   GLUCOSE 96 12/09/2012 1048   BUN 16  09/07/2020 0046   BUN 50 (A) 08/10/2017 0000   BUN 36.0 (H) 02/20/2014 1432   CREATININE 3.29 (H) 09/07/2020 0046   CREATININE 2.4 (H) 02/20/2014 1432   CALCIUM 8.8 (L) 09/07/2020 0046   CALCIUM 10.4 02/20/2014 1432   PROT 4.9 (L) 09/07/2020 0046   PROT 7.2 02/20/2014 1432   ALBUMIN 1.8 (L) 09/07/2020 0046   ALBUMIN 3.8 02/20/2014 1432   AST 13 (L) 09/07/2020 0046   AST 22 02/20/2014 1432   ALT 9 09/07/2020 0046   ALT 12 02/20/2014 1432   ALKPHOS 73 09/07/2020 0046   ALKPHOS 86 02/20/2014 1432   BILITOT 0.4 09/07/2020 0046   BILITOT 0.32 02/20/2014 1432   GFRNONAA 19 (L) 09/07/2020 0046   GFRAA 8 (L) 04/08/2019 1702   Lipase     Component Value Date/Time   LIPASE 47 08/15/2020 0659       Studies/Results: CT ABDOMEN PELVIS W CONTRAST  Result Date: 09/05/2020 CLINICAL DATA:  Abdominal pain EXAM: CT ABDOMEN AND PELVIS WITH CONTRAST TECHNIQUE: Multidetector CT imaging of the abdomen and pelvis was performed using the standard protocol following bolus administration of intravenous contrast. CONTRAST:  127mL OMNIPAQUE IOHEXOL 300 MG/ML  SOLN COMPARISON:  08/29/2020 FINDINGS: Lower chest: Mild infiltrate is noted in the left lung base. Minimal pleural effusion is noted bilaterally. Hepatobiliary: Dependent density is noted within the  gallbladder new from the prior exam likely representing vicarious excretion of contrast or mild gallbladder sludge. Scattered small hypodensities are again seen within the liver likely representing cysts but incompletely evaluated on this exam. Pancreas: Unremarkable. No pancreatic ductal dilatation or surrounding inflammatory changes. Spleen: Normal in size without focal abnormality. Adrenals/Urinary Tract: Adrenal glands are within normal limits. Kidneys demonstrate a normal enhancement pattern with multiple bilateral cysts stable in appearance from the prior exam. Some of these are hyperdense in nature consistent with proteinaceous or hemorrhagic cysts. No  obstructive changes are seen. The bladder is partially distended. Stomach/Bowel: Colon is predominately decompressed. Scattered diverticular changes noted without evidence of diverticulitis. The appendix is within normal limits. Stomach is decompressed when compared with the prior exam. No small bowel dilatation is seen. Mild inflammatory change surrounding the rectum is seen, slightly progressed when compared with the prior exam. Previous anastomosis in the small bowel is again identified and stable. Vascular/Lymphatic: Aortic atherosclerosis. No enlarged abdominal or pelvic lymph nodes. Reproductive: Prostate is unremarkable. Other: No abdominal wall hernia or abnormality. No abdominopelvic ascites. Musculoskeletal: Degenerative changes of lumbar spine are noted. Defect is noted within the right rectus muscle consistent with prior ileostomy which has been reversed. IMPRESSION: Mild increased perirectal inflammatory change likely representing some focal colitis. Diverticulosis without diverticulitis. Multiple stable cystic lesions within the kidneys. New mild infiltrate in the left lower lobe. Minimal effusions are noted bilaterally. New dependent density within the gallbladder likely representing sludge or vicarious excretion of contrast. Electronically Signed   By: Inez Catalina M.D.   On: 09/05/2020 11:51    Anti-infectives: Anti-infectives (From admission, onward)   Start     Dose/Rate Route Frequency Ordered Stop   09/06/20 2200  fidaxomicin (DIFICID) tablet 200 mg        200 mg Oral 2 times daily 09/06/20 1530 09/16/20 2159   09/06/20 0930  vancomycin (VANCOCIN) 50 mg/mL oral solution 500 mg  Status:  Discontinued        500 mg Oral Every 6 hours 09/06/20 0830 09/06/20 1530   09/01/20 1600  metroNIDAZOLE (FLAGYL) IVPB 500 mg  Status:  Discontinued        500 mg 100 mL/hr over 60 Minutes Intravenous Every 12 hours 09/01/20 1315 09/05/20 1430   08/26/20 1000  vancomycin (VANCOCIN) 50 mg/mL oral  solution 500 mg        500 mg Oral 4 times daily 08/26/20 0740 09/03/20 2159   08/24/20 2100  vancomycin (VANCOCIN) 50 mg/mL oral solution 125 mg  Status:  Discontinued        125 mg Oral 4 times daily 08/24/20 1717 08/26/20 0740       Assessment/Plan Hx of rectal CA s/p LAR and chemo/radiation  HTN Hx of CVA COPD ESRD on HD A. Fib  Ischemic colitis - WBC down to 13 this AM - Patient continues to have generalized pain but abdomen is soft without signs of peritonitis. No signs of pneumatosis or portal venous gas on CT, although there is persistent thickening consistent with colitis. Surgical resection would be high risk given prior radiation to the pelvis and prior rectal dissection with coloanal anastomosis. For now recommend continued medical management of colitis. Ensure patient stays adequately hydrated and maintain normotension to maximize perfusion of the colon. - Given persistent pain and CT findings, abx were resumed 3/3 - Ok for sips of clear liquids, but do not advance diet any further. Continue TPN for nutritional support. - We will continue to follow closely  FEN: NPO, IVF; TPN VTE: heparin with HD ID: PO vanc 2/18>2/28; IV flagyl 1/26>3/2; PO dificid 3/3>>  LOS: 14 days    [name] , [facility] 09/07/2020, 9:23 AM Please see Amion for pager number during day hours 7:00am-4:30pm

## 2020-09-07 NOTE — Plan of Care (Signed)
  Problem: Health Behavior/Discharge Planning: Goal: Ability to manage health-related needs will improve Outcome: Progressing   

## 2020-09-07 NOTE — Progress Notes (Signed)
PHARMACY - TOTAL PARENTERAL NUTRITION CONSULT NOTE   Indication: Prolonged ileus in the setting of ischemic colitis  Patient Measurements: Height: 5\' 8"  (172.7 cm) Weight:  (Bed scale broken) IBW/kg (Calculated) : 68.4   Body mass index is 17.6 kg/m. Usual Weight: ~63kg  Assessment:  [name] with hx rectal cancer s/p radiation/chemo, anteriro resection and ileostomy reversal, afib on warfarin with admit INR 8.8 now s/p reversal, ESRD on HD TTS, PUD with recent GIB, anemia of chronic disease, CVA, COPD, gout, and HTN admitted with melena, C.diff colitis, prolonged ileus, malnutrition with poor PO intake >11 days and hypoglycemia requiring D10 infusion. Patient lost ~15% of body weight in one month. Failed attempt to place NGT 2/26. PICC placement ok'd by nephrology. Pharmacy consulted for TPN.   Glucose / Insulin: A1C 5.5%. BG controlled, SSI d/c'd 3/3 due to lack of use Electrolytes: K low 3.1 (using 3K HD bath; s/p K runs x 4 yesterday), Phos down to 1.7, corrected Ca slightly high ~10.5; Mag up to 2.2 (s/p mag sulfate 2g IV x 1 yesterday); others WNL Renal: ESRD on HD TTS  Hepatic: LFTs / Tbili / TG wnl. Albumin 1.8, prealbumin 8.9 Intake / Output; MIVF: continues with 5-6 loose BMs yesterday, has chronic diarrhea  GI Imaging: 2/18 CT Abd- Wall thickening of the sigmoid colon and rectum, consistent with acute colitis/proctitis 2/24 CT Abd with IMA occlusion but celiac and SMA patent; no evidence of acute ischemia. Increased distension of the stomach, small bowel and colon since 08/24/2020. 2/25 DG abd- no NGT, Gastric and small bowel dilatation 3/2 CT abd pelvis - likely focal colitis, diverticulosis, new LLL infiltrate, new density within gallbladder (likely sludge or excretion of contrast) GI Surgeries / Procedures: 2/25 EGD- Mildly severe esophagitis with a small 2-3 mm ulcer with no bleeding; Retained fluid and small amounts of solid debris in stomach; attempted NGT multiple times but  unable to advance  2/26 Flex sig -severely ulcerated mucosa of the sigmoid colon/ rectum, possibly radiation changes vs ischemia vs infection  Central access: 3/1 TPN start date: 3/1   Nutritional Goals: (per RD recommendations 3/2) kCal: 1900-2100, Protein: 100-115g, Fluid: 1L + UOP per day Goal TPN rate is 65 mL/hr (provides 101g of protein and weekly avg of 1945kcals per day)  Current Nutrition:  TPN; sips of clears started 3/3  Plan:  Continue TPN at goal rate 24mL/hr at 1800 - meeting 100% of estimated weekly protein and kCal needs (50g SMOF lipids on MWF only due to Producer, television/film/video) Electrolytes in TPN: Na 100 mEq/L, K 25 mEq/L, Mg 6 mEq/L, remove Ca, increase Phos to 8 mmol/L, Cl:Ac 1:1 Add standard MVI and trace elements to TPN, remove chromium with ESRD requiring RRT Give KPhos 12mmol IV x 1 + K runs x 2 per discussion with Dr. Moshe Cipro SSI d/c'd on 3/3 due to lack of utilization - monitor CBGs F/u AM labs, Surgery plans   Jason [name], PharmD, BCPS Please check AMION for all [facility] contact numbers Clinical Pharmacist 09/07/2020 7:47 AM

## 2020-09-07 NOTE — Progress Notes (Addendum)
Palliative Medicine Inpatient Follow Up Note  Reason for consult:  Goals of Care  HPI:  Per intake H&P --> 75 year old gentleman withextensive medical issues and comorbidities includinghistory of chronic A. fib on Coumadin, peptic ulcer disease, ESRD on hemodialysis TTS schedule, history of rectal cancer status post low anterior resection and reversal of ileostomywho presented to the ER with abdominal cramping, nausea vomiting diarrhea and rectal bleeding. Patient was recently in the hospital 2/9-2/13 with upper GI bleeding and found to have bleeding duodenal ulcer treated with epinephrine injection and clipping. Discharged on Protonix twice daily. Patient also has chronic diarrhea. Admitted with rectal bleeding, C. difficile infection and diarrhea and anemia. Patient was hemodynamically stable in the ER.Admitted with C. diff, rectal bleeding. Followed by GI.  Palliative care was consulted in the setting of difficult to treat conditions to discuss goals of care.  Today's Discussion (09/07/2020):  *Please note that this is a verbal dictation therefore any spelling or grammatical errors are due to the "[place] [employer]" system interpretation.  I met with patients wife, [name] and their son, [name]. We discussed his present health state and the fact that he is now not safe to eat or drink and on concentrated nutrition via his vein. I shared that there is growing concern amongst providers in regards to Gl[place] present health state. We discussed being hopeful everyday for improvements though also realistic about potential outcomes.  I asked [name] if we may arrange for a family meeting. I shared with her that we are at a point where it would be worth while to discuss collectively best case and worst care scenarios. She and Shaun were in agreement with this. They stated that they would prefer for [place]. To be present as well and stated that they would get back to the PMT to discuss the  time.  Patients family requested completion of durable POA paperwork. I shared with them that we can only do health care paperwork and we are unable to do any financial documents. I recommended they have an outside notary come in to complete the [place] paperwork if they are interested in completion of this.  Question and concerns answered __________________________________________ Addendum:  Spoke to patients son, Shaun. We plan to meet Monday at [place]. We will include his brother, Meziah via speakerphone.  Objective Assessment: Vital Signs Vitals:   09/07/20 0355 09/07/20 0819  BP: 140/71 (!) 152/79  Pulse: 91 94  Resp: 16 12  Temp: 98 F (36.7 C) 98.1 F (36.7 C)  SpO2: 100% 100%    Intake/Output Summary (Last 24 hours) at 09/07/2020 1227 Last data filed at 09/07/2020 0400 Gross per 24 hour  Intake 2091.97 ml  Output 2300 ml  Net -208.03 ml   Last Weight  Most recent update: 09/04/2020 10:36 AM   Weight  52.5 kg (115 lb 11.9 oz)           Gen:  Older AA male, chronically ill appearing HEENT: Dry mucous membranes CV: Irregular rate and rhythm  PULM:  2LPM [place] ABD: soft/nontender  EXT: (+) muscle wasting Neuro: Somnolent  SUMMARY OF RECOMMENDATIONS   DNAR/DNI  MOST/DNR in [facility]  Plan for meeting on Monday at [place], ideally through [facility]  Time Spent: 25 Greater than 50% of the time was spent in counseling and coordination of care ______________________________________________________________________________________ [facility] Team Team Cell Phone: [phone] Please utilize secure chat with additional questions, if there is no  response within 30 minutes please call the above phone number  Palliative Medicine Team providers are available by phone from 7am to 7pm daily and can be reached through the team cell phone.  Should this patient require assistance outside of these hours, please call the  patient's attending physician.

## 2020-09-07 NOTE — Progress Notes (Signed)
[facility] Progress Note   Subjective:     HD yest-  removed 2 liters , tolerated well- phos and k low today- giving repletion thru tpn and extra k- now on dificid per ID.  SAYS BELLY IS A LITTLE BETTER!!! Asking about eating   Objective Vitals:   09/07/20 0009 09/07/20 0036 09/07/20 0355 09/07/20 0819  BP: (!) 171/77 (!) 163/71 140/71 (!) 152/79  Pulse: 99 86 91 94  Resp: 16 11 16 12   Temp: 98.5 F (36.9 C)  98 F (36.7 C) 98.1 F (36.7 C)  TempSrc: Oral  Oral Oral  SpO2: 100% 98% 100% 100%  Weight:      Height:       Physical Exam General: elderly, ill appearing male, NAD Heart:+tachycardia, RR, no mrg Lungs:mostly CTAB, breath sounds decreased bilaterally Abdomen: soft, mildly tender throughout Extremities:no LE edema Dialysis Access: LU AVF +bruit  OP HD: [facility] TTS  4h  450/500  58kg  3K/2Ca   P2    L AVF    Heparin none  Mircera 50 (to start 2/22) Venofer 50  Hectorol 7   Assessment/Plan: 1. C. Diff colitis - on po Vanc/Florastor w/ persistent abd pain and diarrhea. Flex sig 2/26 showed severely ulcerated mucosa of the sigmoid colon/ rectum, possibly radiation changes vs ischemia vs infection. Per primary/GI. Now needing to do TPN.    Does not seem to be responding well to treatment so far- added on dificid,  Says is better today  2. GI bleed/ ABL anemia:  w/ supra therapeutic INR on admit (EGD2/9with duodenal ulcer, treated w/ clip+ injection). On Protonix.   s/p 1unit pRBC 2/24. dec in hgb this week-   3. ESRD - HD TTS. Next HD 3/5. No heparin via AVF.  K is low-  Can only do 3 K bath-  No 4 k avail -  Supplement PO-  also TPN 4. AFib- on coumadin. Supratherapeutic INR on admission.Reversed  5. HTN/Volume - BP's high today.Under dry wt.  Challenge as able with HD now on continuous TPN   6. Anemia CKD --see #1. Hgb9's, now 7's- has had 3u prbc's here total. Continue aranesp - increased dose to 200qwk q Sat 7. MBD CKD --Corr [place].   Cont  home hectorol dosing with HD but holding PO meds  8. COPD  9. H/o prior rectal cancer - in 2014 treated w/ chemoRx,radiation, laparoscopic low anterior resection with diverting loop ileostomy and subsequent ileostomy takedown. 10. GOC - seen by palliative care, pt is now DNR    Jason [name]  09/01/2020, 10:01 AM   Filed Weights   09/04/20 0725 09/04/20 1032  Weight: 54 kg 52.5 kg    Intake/Output Summary (Last 24 hours) at 09/07/2020 0838 Last data filed at 09/07/2020 0400 Gross per 24 hour  Intake 2091.97 ml  Output 2300 ml  Net -208.03 ml    Additional Objective Labs: Basic Metabolic Panel: Recent Labs  Lab 09/05/20 0508 09/06/20 0524 09/07/20 0046  NA 136 135 135  K 3.2* 3.2* 3.1*  CL 101 102 101  CO2 25 24 27   GLUCOSE 93 117* 106*  BUN 16 31* 16  CREATININE 4.34* 5.61* 3.29*  CALCIUM 8.4* 8.8* 8.8*  PHOS 1.7* 3.2 1.7*   Liver Function Tests: Recent Labs  Lab 09/05/20 0508 09/06/20 0524 09/07/20 0046  AST 12* 10* 13*  ALT 9 9 9   ALKPHOS 84 73 73  BILITOT 0.3 0.1* 0.4  PROT 4.9* 4.7* 4.9*  ALBUMIN  1.9* 1.8* 1.8*   CBC: Recent Labs  Lab 09/03/20 0053 09/04/20 0124 09/05/20 0508 09/06/20 0524 09/07/20 0046  WBC 17.0* 15.8* 19.4* 16.0* 13.1*  NEUTROABS 14.4* 12.8* 16.0* 12.5* 11.7*  HGB 7.9* 7.9* 7.7* 7.4* 7.2*  HCT 25.2* 24.4* 24.8* 23.9* 22.1*  MCV 85.4 85.0 87.0 87.9 87.0  PLT 272 295 296 252 234   Blood Culture    Component Value Date/Time   SDES IN/OUT CATH URINE 08/17/2020 1234   SPECREQUEST  08/17/2020 1234    NONE Performed at [facility] [address], [place], [place] [address]    CULT 4,000 COLONIES/mL ENTEROCOCCUS GALLINARUM (A) 08/17/2020 1234   REPTSTATUS 08/21/2020 FINAL 08/17/2020 1234   CBG: Recent Labs  Lab 09/06/20 0814 09/06/20 1551 09/07/20 0038 09/07/20 0353 09/07/20 0817  GLUCAP 133* 144* 116* 116* 116*   Medications: . sodium chloride    . sodium chloride    . potassium chloride    .  potassium PHOSPHATE IVPB (in mmol)    . TPN ADULT (ION) 65 mL/hr at 09/06/20 1900   . sodium chloride   Intravenous Once  . Chlorhexidine Gluconate Cloth  6 each Topical Q0600  . Chlorhexidine Gluconate Cloth  6 each Topical Q0600  . [START ON 09/08/2020] darbepoetin (ARANESP) injection - DIALYSIS  200 mcg Intravenous Q Sat-HD  . doxercalciferol  7 mcg Intravenous Q T,Th,Sa-HD  . fidaxomicin  200 mg Oral BID  . pantoprazole  40 mg Oral BID  . sodium chloride flush  10-40 mL Intracatheter Q12H

## 2020-09-08 DIAGNOSIS — K2970 Gastritis, unspecified, without bleeding: Secondary | ICD-10-CM | POA: Diagnosis not present

## 2020-09-08 DIAGNOSIS — R1084 Generalized abdominal pain: Secondary | ICD-10-CM | POA: Diagnosis not present

## 2020-09-08 DIAGNOSIS — K559 Vascular disorder of intestine, unspecified: Secondary | ICD-10-CM | POA: Diagnosis not present

## 2020-09-08 DIAGNOSIS — A0472 Enterocolitis due to Clostridium difficile, not specified as recurrent: Secondary | ICD-10-CM | POA: Diagnosis not present

## 2020-09-08 DIAGNOSIS — K633 Ulcer of intestine: Secondary | ICD-10-CM | POA: Diagnosis not present

## 2020-09-08 DIAGNOSIS — R140 Abdominal distension (gaseous): Secondary | ICD-10-CM | POA: Diagnosis not present

## 2020-09-08 LAB — CBC WITH DIFFERENTIAL/PLATELET
Abs Immature Granulocytes: 0.89 10*3/uL — ABNORMAL HIGH (ref 0.00–0.07)
Basophils Absolute: 0 10*3/uL (ref 0.0–0.1)
Basophils Relative: 0 %
Eosinophils Absolute: 0.1 10*3/uL (ref 0.0–0.5)
Eosinophils Relative: 1 %
HCT: 23.8 % — ABNORMAL LOW (ref 39.0–52.0)
Hemoglobin: 7.5 g/dL — ABNORMAL LOW (ref 13.0–17.0)
Immature Granulocytes: 7 %
Lymphocytes Relative: 6 %
Lymphs Abs: 0.8 10*3/uL (ref 0.7–4.0)
MCH: 27.6 pg (ref 26.0–34.0)
MCHC: 31.5 g/dL (ref 30.0–36.0)
MCV: 87.5 fL (ref 80.0–100.0)
Monocytes Absolute: 1.5 10*3/uL — ABNORMAL HIGH (ref 0.1–1.0)
Monocytes Relative: 12 %
Neutro Abs: 9.1 10*3/uL — ABNORMAL HIGH (ref 1.7–7.7)
Neutrophils Relative %: 74 %
Platelets: 239 10*3/uL (ref 150–400)
RBC: 2.72 MIL/uL — ABNORMAL LOW (ref 4.22–5.81)
RDW: 21.2 % — ABNORMAL HIGH (ref 11.5–15.5)
WBC: 12.5 10*3/uL — ABNORMAL HIGH (ref 4.0–10.5)
nRBC: 0 % (ref 0.0–0.2)

## 2020-09-08 LAB — COMPREHENSIVE METABOLIC PANEL
ALT: 14 U/L (ref 0–44)
AST: 24 U/L (ref 15–41)
Albumin: 1.9 g/dL — ABNORMAL LOW (ref 3.5–5.0)
Alkaline Phosphatase: 83 U/L (ref 38–126)
Anion gap: 8 (ref 5–15)
BUN: 32 mg/dL — ABNORMAL HIGH (ref 8–23)
CO2: 26 mmol/L (ref 22–32)
Calcium: 9.4 mg/dL (ref 8.9–10.3)
Chloride: 99 mmol/L (ref 98–111)
Creatinine, Ser: 4.49 mg/dL — ABNORMAL HIGH (ref 0.61–1.24)
GFR, Estimated: 13 mL/min — ABNORMAL LOW (ref >60)
Glucose, Bld: 118 mg/dL — ABNORMAL HIGH (ref 70–99)
Potassium: 4.1 mmol/L (ref 3.5–5.1)
Sodium: 133 mmol/L — ABNORMAL LOW (ref 135–145)
Total Bilirubin: 0.3 mg/dL (ref 0.3–1.2)
Total Protein: 5.3 g/dL — ABNORMAL LOW (ref 6.5–8.1)

## 2020-09-08 LAB — GLUCOSE, CAPILLARY: Glucose-Capillary: 93 mg/dL (ref 70–99)

## 2020-09-08 LAB — PHOSPHORUS: Phosphorus: 2.7 mg/dL (ref 2.5–4.6)

## 2020-09-08 LAB — MAGNESIUM: Magnesium: 2.2 mg/dL (ref 1.7–2.4)

## 2020-09-08 MED ORDER — HYDROMORPHONE HCL 1 MG/ML IJ SOLN
INTRAMUSCULAR | Status: AC
  Filled 2020-09-08: qty 0.5

## 2020-09-08 MED ORDER — TRACE MINERALS CU-MN-SE-ZN 300-55-60-3000 MCG/ML IV SOLN
INTRAVENOUS | Status: AC
  Filled 2020-09-08: qty 676

## 2020-09-08 MED ORDER — DARBEPOETIN ALFA 200 MCG/0.4ML IJ SOSY
PREFILLED_SYRINGE | INTRAMUSCULAR | Status: AC
  Filled 2020-09-08: qty 0.4

## 2020-09-08 NOTE — Progress Notes (Signed)
PHARMACY - TOTAL PARENTERAL NUTRITION CONSULT NOTE   Indication: Prolonged ileus in the setting of ischemic colitis  Patient Measurements: Height: 5\' 8"  (172.7 cm) Weight:  (Bed scale broken) IBW/kg (Calculated) : 68.4   Body mass index is 17.6 kg/m. Usual Weight: ~63kg  Assessment:  [name] with hx rectal cancer s/p radiation/chemo, anteriro resection and ileostomy reversal, afib on warfarin with admit INR 8.8 now s/p reversal, ESRD on HD TTS, PUD with recent GIB, anemia of chronic disease, CVA, COPD, gout, and HTN admitted with melena, C.diff colitis, prolonged ileus, malnutrition with poor PO intake >11 days and hypoglycemia requiring D10 infusion. Patient lost ~15% of body weight in one month. Failed attempt to place NGT 2/26. PICC placement ok'd by nephrology. Pharmacy consulted for TPN.   Glucose / Insulin: A1C 5.5%. BG controlled, SSI d/c'd 3/3 Electrolytes: Na low 133, K and Phos normalized (using 3K HD bath; s/p K runs x 2 + KPhos 7mmol IV x 1 yesterday), corrected Ca high ~11; others WNL Renal: ESRD on HD TTS  Hepatic: LFTs / Tbili / TG wnl. Albumin 1.9, prealbumin 8.9 Intake / Output; MIVF: loose BMs yesterday, has chronic diarrhea; UOP 0.1 ml/kg/hr GI Imaging: 2/18 CT Abd- Wall thickening of the sigmoid colon and rectum, consistent with acute colitis/proctitis 2/24 CT Abd with IMA occlusion but celiac and SMA patent; no evidence of acute ischemia. Increased distension of the stomach, small bowel and colon since 08/24/2020. 2/25 DG abd- no NGT, Gastric and small bowel dilatation 3/2 CT abd pelvis - likely focal colitis, diverticulosis, new LLL infiltrate, new density within gallbladder (likely sludge or excretion of contrast) GI Surgeries / Procedures: 2/25 EGD- Mildly severe esophagitis with a small 2-3 mm ulcer with no bleeding; Retained fluid and small amounts of solid debris in stomach; attempted NGT multiple times but unable to advance  2/26 Flex sig -severely ulcerated  mucosa of the sigmoid colon/ rectum, possibly radiation changes vs ischemia vs infection  Central access: 3/1 TPN start date: 3/1   Nutritional Goals: (per RD recommendations 3/2) kCal: 1900-2100, Protein: 100-115g, Fluid: 1L + UOP per day Goal TPN rate is 65 mL/hr (provides 101g of protein and weekly avg of 1945kcals per day)  Current Nutrition:  TPN CLD started 3/3, no advancement per Surgery  Plan:  Continue TPN at goal rate 61mL/hr at 1800 - meeting 100% of estimated weekly protein and kCal needs (50g SMOF lipids on MWF only due to Producer, television/film/video) Electrolytes in TPN: increase Na to 110 mEq/L; otherwise, continue same today - K 25 mEq/L, Mg 6 mEq/L, remove Ca, Phos 8 mmol/L, Cl:Ac 1:1 Add standard MVI and trace elements to TPN, remove chromium with ESRD requiring RRT SSI d/c'd on 3/3 due to lack of utilization - monitor CBGs F/u AM labs, Surgery plans   Arturo Morton, PharmD, BCPS Please check AMION for all [facility] [place] contact numbers Clinical Pharmacist 09/08/2020 7:42 AM

## 2020-09-08 NOTE — Progress Notes (Addendum)
Subjective: For hemodialysis today, complains of some abdominal discomfort this a.m. Any improvement yesterday has been lost  Objective Vital signs in last 24 hours: Vitals:   09/07/20 1533 09/07/20 2047 09/08/20 0258 09/08/20 0602  BP: (!) 177/75 (!) 145/82 (!) 153/77 (!) 151/79  Pulse: 93 (!) 101 94 100  Resp: 16 16 16 15   Temp: 98.3 F (36.8 C) 98 F (36.7 C) 98.2 F (36.8 C) 97.9 F (36.6 C)  TempSrc: Oral Oral Oral Oral  SpO2: 100% 100% 100% 100%  Weight:      Height:       Weight change:    Physical Exam General: elderly, ill appearing male, NAD Heart:+tachycardia, RR, no mrg Lungs CTAB,  Abdomen: soft, mildly tender throughout Extremities:no LE edema Dialysis Access: LU AVF +bruit  OP HD: [facility] TTS  4h  450/500  58kg  3K/2Ca   P2    L AVF    Heparin none  Mircera 50 (to start 2/22) Venofer 50  Hectorol 7   Problem/Plan: 1. C. Diff colitis - on po Vanc/Florastor w/ persistent abd pain and diarrhea. Flex sig 2/26 showed severely ulcerated mucosa of the sigmoid colon/ rectum, possibly radiation changes vs ischemia vs infection. Per primary/GI. Now needing to do TPN.   added on dificid,  2. GI bleed/ ABL anemia:  w/ supra therapeutic INR on admit (EGD2/9with duodenal ulcer, treated w/ clip+ injection). On Protonix.   s/p 1unit pRBC 2/24. dec in hgb this week-  3. ESRD - HD TTS.  HD on schedule next HD 3/5- today. No heparin .  K 4.1   noted also TPN 4. AFib- on coumadin. Supratherapeutic INR on admission.Reversed  5. HTN/Volume -a.m. BP 151/79 .Under dry wt.  Challenge as able with HD now on continuous TPN   6. Anemia CKD --see #1. Hgb7.5,  has had 3u prbc's here total. Continue aranesp - increased dose to 200qwk q Sat 7. MBD CKD --Corr Ca^.     Will hold  hectorol, Phos 2.7, no binders,on TPN 8. COPD  9. H/o prior rectal cancer - in 2014 treated w/ chemoRx,radiation, laparoscopic low anterior resection with diverting loop ileostomy and subsequent  ileostomy takedown. 10. GOC - seen by palliative care, pt is now DNR  Ernest Haber, PA-C [place] [phone] 09/08/2020,9:40 AM  LOS: 15 days    Patient seen and examined, agree with above note with above modifications. Had told me yesterday pain was better but now is back-  Resting with wife at bedside-  GI concerned about lack of improvement as am I.  Planning for routine HD today -  Continue also with appropriate titration of dialysis related meds  Corliss Parish, MD 09/08/2020     Labs: Basic Metabolic Panel: Recent Labs  Lab 09/07/20 0046 09/07/20 1647 09/08/20 0133  NA 135 133* 133*  K 3.1* 4.0 4.1  CL 101 99 99  CO2 27 25 26   GLUCOSE 106* 120* 118*  BUN 16 30* 32*  CREATININE 3.29* 4.04* 4.49*  CALCIUM 8.8* 9.1 9.4  PHOS 1.7* 3.1 2.7   Liver Function Tests: Recent Labs  Lab 09/06/20 0524 09/07/20 0046 09/07/20 1647 09/08/20 0133  AST 10* 13*  --  24  ALT 9 9  --  14  ALKPHOS 73 73  --  83  BILITOT 0.1* 0.4  --  0.3  PROT 4.7* 4.9*  --  5.3*  ALBUMIN 1.8* 1.8* 2.0* 1.9*   No results for input(s): LIPASE, AMYLASE in the  last 168 hours. No results for input(s): AMMONIA in the last 168 hours. CBC: Recent Labs  Lab 09/05/20 0508 09/06/20 0524 09/07/20 0046 09/07/20 1647 09/08/20 0133  WBC 19.4* 16.0* 13.1* 11.3* 12.5*  NEUTROABS 16.0* 12.5* 11.7*  --  9.1*  HGB 7.7* 7.4* 7.2* 7.6* 7.5*  HCT 24.8* 23.9* 22.1* 24.6* 23.8*  MCV 87.0 87.9 87.0 87.9 87.5  PLT 296 252 234 245 239   Cardiac Enzymes: No results for input(s): CKTOTAL, CKMB, CKMBINDEX, TROPONINI in the last 168 hours. CBG: Recent Labs  Lab 09/06/20 1551 09/07/20 0038 09/07/20 0353 09/07/20 0817 09/07/20 1302  GLUCAP 144* 116* 116* 116* 130*    Studies/Results: No results found. Medications: . sodium chloride    . sodium chloride    . TPN ADULT (ION) 65 mL/hr at 09/08/20 0604   . sodium chloride   Intravenous Once  . Chlorhexidine Gluconate Cloth  6  each Topical Q0600  . Chlorhexidine Gluconate Cloth  6 each Topical Q0600  . Chlorhexidine Gluconate Cloth  6 each Topical Q0600  . darbepoetin (ARANESP) injection - DIALYSIS  200 mcg Intravenous Q Sat-HD  . doxercalciferol  7 mcg Intravenous Q T,Th,Sa-HD  . fidaxomicin  200 mg Oral BID  . niacin  250 mg Oral BID WC  . pantoprazole  40 mg Oral BID  . sodium chloride flush  10-40 mL Intracatheter Q12H

## 2020-09-08 NOTE — Progress Notes (Signed)
   Patient Name: Jason GENEST Sr. Date of Encounter: 09/08/2020, 9:35 AM    Subjective  Patient resting when I came, wife there.  He reports he had a "rough" night.  Says his abdominal pain is 8 out of 10 which is where it has been.  He reports diarrhea though there are no stools recorded    Objective  BP (!) 151/79 (BP Location: Left Leg)   Pulse 100   Temp 97.9 F (36.6 C) (Oral)   Resp 15   Ht 5\' 8"  (1.727 m)   Wt 52.5 kg   SpO2 100%   BMI 17.60 kg/m  Chronically ill black man lying in bed no acute distress Abdomen is soft and really somewhat hyperesthetic as before bowel sounds are diminished but present he has voluntary guarding diffusely    CBC Latest Ref Rng & Units 09/08/2020 09/07/2020 09/07/2020  WBC 4.0 - 10.5 K/uL 12.5(H) 11.3(H) 13.1(H)  Hemoglobin 13.0 - 17.0 g/dL 7.5(L) 7.6(L) 7.2(L)  Hematocrit 39.0 - 52.0 % 23.8(L) 24.6(L) 22.1(L)  Platelets 150 - 400 K/uL 239 245 234   Lab Results  Component Value Date   CREATININE 4.49 (H) 09/08/2020   BUN 32 (H) 09/08/2020   NA 133 (L) 09/08/2020   K 4.1 09/08/2020   CL 99 09/08/2020   CO2 26 09/08/2020      Assessment and Plan  Persistent abdominal pain in the setting of C. difficile colitis ischemic colitis previous duodenal ulcer CT scan really unrevealing  History of chronic diarrhea as well  He is not worse but he is certainly not better he does seem to be tolerating small amounts of clear liquids  Palliative care has seen the patient and the plan is to try to have a family meeting on Monday  Wife remains hopeful of improvement I cautioned her that lack of progression is concerning.  His prognosis is very guarded at best.  Just got this we can certainly see Mallie Mussel and then go back if you want I have to put her on the list history of variceal bleeding now with heme positive stool stable hemoglobin but likely concentrated  We will see again on Monday call us back sooner if needed     Gatha Mayer, MD,  [facility] 09/08/2020 9:35 AM

## 2020-09-08 NOTE — Progress Notes (Signed)
PROGRESS NOTE    Jason LANGHANS Sr.  UXL:[id number] DOB: Sep 13, 1945 DOA: 08/24/2020 PCP: [name], MD    Brief Narrative:  Jason Cotta Sr. is a 75 year old gentleman with past medical history significant for chronic atrial fibrillation on Coumadin, peptic ulcer disease, ESRD on HD TTS, history of rectal cancer status post low anterior resection and reversal of ileostomy with chemotherapy/radiation who presented to the ED with abdominal pain/cramping, nausea/vomiting/diarrhea and rectal bleeding. Patient with follow-up with his [name] physician due to recent hospitalization from [name] bleed due to duodenal ulcer, patient had repeat labs performed which was notably positive for C. difficile colitis. Patient was started on medication for his C. difficile infection outpatient, but he noticed a large amount of blood in his stool which was concerning which prompted further evaluation in the ED.  In the ED, patient was found to have an elevated INR, he was given oral vitamin K and started on Protonix. [name] was consulted. Hospital service consulted for further evaluation and management of [name] bleed with supratherapeutic INR in the setting of C. difficile colitis.   Assessment & Plan:   Active Problems:   Hematochezia   GIB (gastrointestinal bleeding)   C. difficile colitis   Generalized abdominal pain   Non-intractable vomiting   Abdominal distension (gaseous)   Gastritis and gastroduodenitis   Noninfectious gastroenteritis   Colon ulcer   Intestinal occlusion (HCC)   Protein-calorie malnutrition, severe   Ischemic colitis ([place])   C. difficile colitis CT angiogram abdomen/pelvis 2/24 suspicious for ileus, no obstruction. Flexible sigmoidoscopy 09/01/2020 with severely ulcerated mucosa at the sigmoid colon/rectum, possibly radiation changes versus ischemia versus infection. --ID/general surgery/[name] following --WBC 17.4>30.935.7>29.0>23.3>19.5>17.0>15.8>13.1>12.5 --Difcid 200mg  PO  BID x 10 days --CBC daily  Acute on chronic [name] bleeding Acute blood loss anemia, POA Supratherapeutic INR, POA Patient reports large bloody bowel movement prior to ED presentation. INR notably elevated at 8.8 on admission. Received oral vitamin K for reversal. Recent hospitalization with finding of duodenal ulcer on EGD. Discharged on Protonix twice daily. EGD on 09/01/2020 with no evidence of active bleeding. Colonoscopy 2/26 with findings consistent with severe radiation changes, proctitis, ischemic vs inflammatory colitis, strictures. --[name] following, appreciate assistance. --Hgb 8.9>6.8>>8.6>7.0>9.6>8.9>9.7>9.1>7.9>7.4>7.2>7.5 --s/p 2u pRBC 2/19 and 1 u pRBC 2/24 --Aranesp 43mcg every Sat w/ HD --Continue to monitor hemoglobin daily, transfuse for Hgb <7.0 or active bleeding --CBC in am  Nausea/vomiting Ileus Hx rectal cancer s/p LAR, XRT, chemotherapy No gastric outlet obstruction noted on endoscopy. Colonoscopy notable for anal stenosis with rectal stenosis and ulceration from previous surgery/radiation. CT angiogram abdomen/pelvis with occlusion IMA but patent celiac and SMA with collateral filling. --General surgery following, appreciate assistance --CT abdomen/pelvis today with mild increase perirectal inflammatory change consistent with focal colitis, colon decompressed, stomach decompressed, no small bowel dilation --CLD; on TPN; no further advancement of diet per general surgery  Chronic atrial fibrillation on Coumadin Supratherapeutic INR INR 8.8 admission, reversed with oral vitamin K. --Continue to hold Coumadin --Continue to monitor on telemetry  ESRD on HD TTS --Nephrology following, appreciate assistance --Continue HD inpatient per nephrology  Hypoglycemic episodes Etiology likely secondary to prolonged n.p.o. status.  Now on TPN --continue to monitor glucose closely  Adult failure to thrive Severe protein calorie malnutrition 2/2 prolonged illness and  ileus Body mass index is 17.53 kg/m.  Albumin 1.9.  Oral intake limited to ability to tolerate given nausea/vomiting and C. difficile colitis with ileus.  --Continues clear liquid diet without further advancement  per general surgery --Continue TPN  Weakness/debility/deconditioning: Patient with prolonged hospitalization due to ileus and severe C. difficile colitis.  Prior to hospitalization, patient was fairly independent in regards to his ADLs.  His decline during this hospitalization has also been complicated by his prolonged n.p.o. status. --Continue PT/OT efforts while inpatient with plans of SNF discharge when medically stable  Goals of care: --Palliative care following, appreciate assistance.  Discussed with patient and spouse extensively, very poor surgical candidate if needed and possibly would not survive surgical intervention.  Discussed with spouse consideration of comfort measures/palliative if patient does not respond with conservative measures.  She will discuss with her 2 sons before making a decision. --Plan family meeting on Monday at 5 PM with palliative care    DVT prophylaxis: SCDs   Code Status: DNR Family Communication: Updated spouse present at bedside this morning  Disposition Plan:  Level of care: Telemetry Medical Status is: Inpatient  Remains inpatient appropriate because:Ongoing diagnostic testing needed not appropriate for outpatient work up, Unsafe d/c plan, IV treatments appropriate due to intensity of illness or inability to take PO and Inpatient level of care appropriate due to severity of illness   Dispo:  Patient From: Home  Planned Disposition: To be determined  Medically stable for discharge: No     Consultants:   [place] [facility]  Nephrology  Palliative care  Infectious disease   Procedures:   EGD  Colonoscopy  Antimicrobials:   Oral vancomycin 2/18 - 2/28; 3/3>>  Metronidazole 2/26 -  3/2    Subjective: Patient seen and examined at bedside, complaining of abdominal pain.  Reports difficult night overnight.  2 stools reported overnight but nursing staff.  Continues on Dificid, day 3 for C. difficile colitis.  No family present this morning.  Unfortunately; prognosis remains very guarded/poor given his lack of improvement with medical measures.  Awaiting family meeting planned for Monday at 5 PM with palliative care.  No other complaints or concerns at this time. Denies headache, no chest pain, palpitations, no fever/chills/night sweats, no nausea/vomiting.  No acute concerns overnight per nursing staff.  Objective: Vitals:   09/07/20 1533 09/07/20 2047 09/08/20 0258 09/08/20 0602  BP: (!) 177/75 (!) 145/82 (!) 153/77 (!) 151/79  Pulse: 93 (!) 101 94 100  Resp: 16 16 16 15   Temp: 98.3 F (36.8 C) 98 F (36.7 C) 98.2 F (36.8 C) 97.9 F (36.6 C)  TempSrc: Oral Oral Oral Oral  SpO2: 100% 100% 100% 100%  Weight:      Height:        Intake/Output Summary (Last 24 hours) at 09/08/2020 1259 Last data filed at 09/08/2020 0604 Gross per 24 hour  Intake 1194.45 ml  Output 150 ml  Net 1044.45 ml   Filed Weights   09/04/20 0725 09/04/20 1032  Weight: 54 kg 52.5 kg    Examination:  General exam: Appears calm and comfortable, chronically ill/cachectic/frail in appearance Respiratory system: Clear to auscultation. Respiratory effort normal. Oxygenating well on room air Cardiovascular system: S1 & S2 heard, RRR. No JVD, murmurs, rubs, gallops or clicks. No pedal edema. Gastrointestinal system: Abdomen is distended, with mild diffuse tenderness to palpation. No organomegaly or masses felt. Normal bowel sounds heard. Central nervous system: Alert and oriented. No focal neurological deficits. Extremities: Symmetric 5 x 5 power. Skin: No rashes, lesions or ulcers Psychiatry: Judgement and insight appear poor. Mood & affect appropriate.     Data Reviewed: I have personally  reviewed following labs and  imaging studies  CBC: Recent Labs  Lab 09/04/20 0124 09/05/20 0508 09/06/20 0524 09/07/20 0046 09/07/20 1647 09/08/20 0133  WBC 15.8* 19.4* 16.0* 13.1* 11.3* 12.5*  NEUTROABS 12.8* 16.0* 12.5* 11.7*  --  9.1*  HGB 7.9* 7.7* 7.4* 7.2* 7.6* 7.5*  HCT 24.4* 24.8* 23.9* 22.1* 24.6* 23.8*  MCV 85.0 87.0 87.9 87.0 87.9 87.5  PLT 295 296 252 234 245 353   Basic Metabolic Panel: Recent Labs  Lab 09/04/20 0124 09/05/20 0508 09/06/20 0524 09/07/20 0046 09/07/20 1647 09/08/20 0133  NA 129* 136 135 135 133* 133*  K 3.0* 3.2* 3.2* 3.1* 4.0 4.1  CL 93* 101 102 101 99 99  CO2 23 25 24 27 25 26   GLUCOSE 105* 93 117* 106* 120* 118*  BUN 30* 16 31* 16 30* 32*  CREATININE 6.49* 4.34* 5.61* 3.29* 4.04* 4.49*  CALCIUM 8.5* 8.4* 8.8* 8.8* 9.1 9.4  MG 1.8 2.1 1.8 2.2  --  2.2  PHOS 3.5 1.7* 3.2 1.7* 3.1 2.7   GFR: Estimated Creatinine Clearance: 10.9 mL/min (A) (by C-G formula based on SCr of 4.49 mg/dL (H)). Liver Function Tests: Recent Labs  Lab 09/04/20 0124 09/05/20 0508 09/06/20 0524 09/07/20 0046 09/07/20 1647 09/08/20 0133  AST 12* 12* 10* 13*  --  24  ALT 8 9 9 9   --  14  ALKPHOS 92 84 73 73  --  83  BILITOT 0.6 0.3 0.1* 0.4  --  0.3  PROT 5.1* 4.9* 4.7* 4.9*  --  5.3*  ALBUMIN 1.9* 1.9* 1.8* 1.8* 2.0* 1.9*   No results for input(s): LIPASE, AMYLASE in the last 168 hours. No results for input(s): AMMONIA in the last 168 hours. Coagulation Profile: No results for input(s): INR, PROTIME in the last 168 hours. Cardiac Enzymes: No results for input(s): CKTOTAL, CKMB, CKMBINDEX, TROPONINI in the last 168 hours. BNP (last 3 results) No results for input(s): PROBNP in the last 8760 hours. HbA1C: No results for input(s): HGBA1C in the last 72 hours. CBG: Recent Labs  Lab 09/07/20 0038 09/07/20 0353 09/07/20 0817 09/07/20 1302 09/08/20 1155  GLUCAP 116* 116* 116* 130* 93   Lipid Profile: No results for input(s): CHOL, HDL, LDLCALC,  TRIG, CHOLHDL, LDLDIRECT in the last 72 hours. Thyroid Function Tests: No results for input(s): TSH, T4TOTAL, FREET4, T3FREE, THYROIDAB in the last 72 hours. Anemia Panel: No results for input(s): VITAMINB12, FOLATE, FERRITIN, TIBC, IRON, RETICCTPCT in the last 72 hours. Sepsis Labs: No results for input(s): PROCALCITON, LATICACIDVEN in the last 168 hours.  No results found for this or any previous visit (from the past 240 hour(s)).       Radiology Studies: No results found.      Scheduled Meds: . sodium chloride   Intravenous Once  . Chlorhexidine Gluconate Cloth  6 each Topical Q0600  . Chlorhexidine Gluconate Cloth  6 each Topical Q0600  . Chlorhexidine Gluconate Cloth  6 each Topical Q0600  . darbepoetin (ARANESP) injection - DIALYSIS  200 mcg Intravenous Q Sat-HD  . fidaxomicin  200 mg Oral BID  . niacin  250 mg Oral BID WC  . pantoprazole  40 mg Oral BID  . sodium chloride flush  10-40 mL Intracatheter Q12H   Continuous Infusions: . sodium chloride    . sodium chloride    . TPN ADULT (ION) 65 mL/hr at 09/08/20 0604  . TPN ADULT (ION)       LOS: 15 days    Time spent: 38 minutes spent on  chart review, discussion with nursing staff, consultants, updating family and interview/physical exam; more than 50% of that time was spent in counseling and/or coordination of care.    Mateen Franssen J [place] DO [facility] Available via Epic secure chat 7am-7pm After these hours, please refer to coverage provider listed on [email or web address] 09/08/2020, 12:59 PM

## 2020-09-08 NOTE — Progress Notes (Signed)
[facility] Progress Note  7 Days Post-Op  Subjective: CC-  Feels about the same as yesterday. Continues to have abdominal pain, worse on the left side. Some nausea at times, no emesis. Loose, non-bloody BM x2 yesterday. Tolerating clear liquids but not taking in much. WBC 12.5 from 11.3, afebrile.   Objective: Vital signs in last 24 hours: Temp:  [97.6 F (36.4 C)-98.3 F (36.8 C)] 97.9 F (36.6 C) (03/05 0602) Pulse Rate:  [93-101] 100 (03/05 0602) Resp:  [15-16] 15 (03/05 0602) BP: (145-177)/(72-82) 151/79 (03/05 0602) SpO2:  [99 %-100 %] 100 % (03/05 0602) Last BM Date: 09/07/20  Intake/Output from previous day: 03/04 0701 - 03/05 0700 In: 1194.5 [P.O.:360; I.V.:834.5] Out: 150 [Urine:150] Intake/Output this shift: No intake/output data recorded.  PE: General: Alert but drowsy, NAD Heart: RRR  Lungs: CTAB, no wheezes, rhonchi, or rales noted. Respiratory effort nonlabored Abd: soft,ND, generalized TTP (worse in the left than right hemiabdomen) without peritonitis or guarding, no masses, hernias, or organomegaly MS: all 4 extremities are symmetrical with no cyanosis, clubbing, or edema. Skin: warm and dry   Lab Results:  Recent Labs    09/07/20 1647 09/08/20 0133  WBC 11.3* 12.5*  HGB 7.6* 7.5*  HCT 24.6* 23.8*  PLT 245 239   BMET Recent Labs    09/07/20 1647 09/08/20 0133  NA 133* 133*  K 4.0 4.1  CL 99 99  CO2 25 26  GLUCOSE 120* 118*  BUN 30* 32*  CREATININE 4.04* 4.49*  CALCIUM 9.1 9.4   PT/INR No results for input(s): LABPROT, INR in the last 72 hours. CMP     Component Value Date/Time   NA 133 (L) 09/08/2020 0133   NA 140 02/24/2017 0000   NA 138 02/20/2014 1432   K 4.1 09/08/2020 0133   K 3.5 02/20/2014 1432   CL 99 09/08/2020 0133   CL 101 12/09/2012 1048   CO2 26 09/08/2020 0133   CO2 30 (H) 02/20/2014 1432   GLUCOSE 118 (H) 09/08/2020 0133   GLUCOSE 93 02/20/2014 1432   GLUCOSE 96 12/09/2012 1048   BUN 32 (H)  09/08/2020 0133   BUN 50 (A) 08/10/2017 0000   BUN 36.0 (H) 02/20/2014 1432   CREATININE 4.49 (H) 09/08/2020 0133   CREATININE 2.4 (H) 02/20/2014 1432   CALCIUM 9.4 09/08/2020 0133   CALCIUM 10.4 02/20/2014 1432   PROT 5.3 (L) 09/08/2020 0133   PROT 7.2 02/20/2014 1432   ALBUMIN 1.9 (L) 09/08/2020 0133   ALBUMIN 3.8 02/20/2014 1432   AST 24 09/08/2020 0133   AST 22 02/20/2014 1432   ALT 14 09/08/2020 0133   ALT 12 02/20/2014 1432   ALKPHOS 83 09/08/2020 0133   ALKPHOS 86 02/20/2014 1432   BILITOT 0.3 09/08/2020 0133   BILITOT 0.32 02/20/2014 1432   GFRNONAA 13 (L) 09/08/2020 0133   GFRAA 8 (L) 04/08/2019 1702   Lipase     Component Value Date/Time   LIPASE 47 08/15/2020 0659       Studies/Results: No results found.  Anti-infectives: Anti-infectives (From admission, onward)   Start     Dose/Rate Route Frequency Ordered Stop   09/06/20 2200  fidaxomicin (DIFICID) tablet 200 mg        200 mg Oral 2 times daily 09/06/20 1530 09/16/20 2159   09/06/20 0930  vancomycin (VANCOCIN) 50 mg/mL oral solution 500 mg  Status:  Discontinued        500 mg Oral Every 6 hours 09/06/20 0830 09/06/20  1530   09/01/20 1600  metroNIDAZOLE (FLAGYL) IVPB 500 mg  Status:  Discontinued        500 mg 100 mL/hr over 60 Minutes Intravenous Every 12 hours 09/01/20 1315 09/05/20 1430   08/26/20 1000  vancomycin (VANCOCIN) 50 mg/mL oral solution 500 mg        500 mg Oral 4 times daily 08/26/20 0740 09/03/20 2159   08/24/20 2100  vancomycin (VANCOCIN) 50 mg/mL oral solution 125 mg  Status:  Discontinued        125 mg Oral 4 times daily 08/24/20 1717 08/26/20 0740       Assessment/Plan Hx of rectal CA s/p LAR and chemo/radiation  HTN Hx of CVA COPD ESRD on HD TTS A. Fib on coumadin - coumadin held Code status DNR/DNI - palliative following, planning for family meeting Monday at 5pm  C diff colitis, Ischemic colitis - WBCdown to 12.5 from 11.3, afebrile -Patient continues to have  generalized pain but abdomen is soft without signs of peritonitis. No signs of pneumatosis or portal venous gas on CT, although there is persistent thickening consistent with colitis. Surgical resection would be high risk given prior radiation to the pelvis and prior rectal dissection with coloanal anastomosis. For now recommend continued medical management of colitis. Ensure patient stays adequately hydrated and maintain normotension to maximize perfusion of the colon. - Given persistent pain and CT findings, abx were resumed 3/3 -Ok for sips of clear liquids, but do not advance diet any further. Continue TPN for nutritional support. - We will continue to follow closely  FEN: CLD, IVF; TPN VTE: heparin with HD ID: PO vanc 2/18>2/28; IV flagyl 1/26>3/2; PO dificid 3/3>>  Plan: Continue current regimen as above with medical management. Would not advance past clear liquids at this point. Continue TPN for nutritional support. Mobilize as able. Palliative planning for family meeting on Monday.   LOS: 15 days    [facility] 09/08/2020, 10:32 AM Please see Amion for pager number during day hours 7:00am-4:30pm

## 2020-09-09 DIAGNOSIS — R140 Abdominal distension (gaseous): Secondary | ICD-10-CM | POA: Diagnosis not present

## 2020-09-09 DIAGNOSIS — K633 Ulcer of intestine: Secondary | ICD-10-CM | POA: Diagnosis not present

## 2020-09-09 DIAGNOSIS — A0472 Enterocolitis due to Clostridium difficile, not specified as recurrent: Secondary | ICD-10-CM | POA: Diagnosis not present

## 2020-09-09 DIAGNOSIS — K2970 Gastritis, unspecified, without bleeding: Secondary | ICD-10-CM | POA: Diagnosis not present

## 2020-09-09 LAB — CBC WITH DIFFERENTIAL/PLATELET
Abs Immature Granulocytes: 0.81 10*3/uL — ABNORMAL HIGH (ref 0.00–0.07)
Basophils Absolute: 0 10*3/uL (ref 0.0–0.1)
Basophils Relative: 0 %
Eosinophils Absolute: 0.1 10*3/uL (ref 0.0–0.5)
Eosinophils Relative: 1 %
HCT: 22.8 % — ABNORMAL LOW (ref 39.0–52.0)
Hemoglobin: 7.3 g/dL — ABNORMAL LOW (ref 13.0–17.0)
Immature Granulocytes: 7 %
Lymphocytes Relative: 7 %
Lymphs Abs: 0.8 10*3/uL (ref 0.7–4.0)
MCH: 28.4 pg (ref 26.0–34.0)
MCHC: 32 g/dL (ref 30.0–36.0)
MCV: 88.7 fL (ref 80.0–100.0)
Monocytes Absolute: 1.5 10*3/uL — ABNORMAL HIGH (ref 0.1–1.0)
Monocytes Relative: 12 %
Neutro Abs: 9 10*3/uL — ABNORMAL HIGH (ref 1.7–7.7)
Neutrophils Relative %: 73 %
Platelets: 227 10*3/uL (ref 150–400)
RBC: 2.57 MIL/uL — ABNORMAL LOW (ref 4.22–5.81)
RDW: 22.6 % — ABNORMAL HIGH (ref 11.5–15.5)
WBC: 12.2 10*3/uL — ABNORMAL HIGH (ref 4.0–10.5)
nRBC: 0.2 % (ref 0.0–0.2)

## 2020-09-09 LAB — GLUCOSE, CAPILLARY
Glucose-Capillary: 142 mg/dL — ABNORMAL HIGH (ref 70–99)
Glucose-Capillary: 117 mg/dL — ABNORMAL HIGH (ref 70–99)

## 2020-09-09 LAB — BASIC METABOLIC PANEL
Anion gap: 7 (ref 5–15)
BUN: 22 mg/dL (ref 8–23)
CO2: 28 mmol/L (ref 22–32)
Calcium: 9.2 mg/dL (ref 8.9–10.3)
Chloride: 101 mmol/L (ref 98–111)
Creatinine, Ser: 3.17 mg/dL — ABNORMAL HIGH (ref 0.61–1.24)
GFR, Estimated: 20 mL/min — ABNORMAL LOW (ref >60)
Glucose, Bld: 108 mg/dL — ABNORMAL HIGH (ref 70–99)
Potassium: 3.5 mmol/L (ref 3.5–5.1)
Sodium: 136 mmol/L (ref 135–145)

## 2020-09-09 LAB — MAGNESIUM: Magnesium: 2.1 mg/dL (ref 1.7–2.4)

## 2020-09-09 LAB — PHOSPHORUS: Phosphorus: 2.1 mg/dL — ABNORMAL LOW (ref 2.5–4.6)

## 2020-09-09 MED ORDER — POTASSIUM PHOSPHATES 15 MMOLE/5ML IV SOLN
20.0000 mmol | Freq: Once | INTRAVENOUS | Status: AC
  Administered 2020-09-09: 20 mmol via INTRAVENOUS
  Filled 2020-09-09: qty 6.67

## 2020-09-09 MED ORDER — TRACE MINERALS CU-MN-SE-ZN 300-55-60-3000 MCG/ML IV SOLN
INTRAVENOUS | Status: AC
  Filled 2020-09-09: qty 676

## 2020-09-09 NOTE — Progress Notes (Signed)
PHARMACY - TOTAL PARENTERAL NUTRITION CONSULT NOTE   Indication: Prolonged ileus in the setting of ischemic colitis  Patient Measurements: Height: 5\' 8"  (172.7 cm) Weight:  (Unable to check bed wt) IBW/kg (Calculated) : 68.4   Body mass index is 17.6 kg/m. Usual Weight: ~63kg  Assessment:  [name] with hx rectal cancer s/p radiation/chemo, anteriro resection and ileostomy reversal, afib on warfarin with admit INR 8.8 now s/p reversal, ESRD on HD TTS, PUD with recent GIB, anemia of chronic disease, CVA, COPD, gout, and HTN admitted with melena, C.diff colitis, prolonged ileus, malnutrition with poor PO intake >11 days and hypoglycemia requiring D10 infusion. Patient lost ~15% of body weight in one month. Failed attempt to place NGT 2/26. PICC placement ok'd by nephrology. Pharmacy consulted for TPN.   Glucose / Insulin: A1C 5.5%. BG controlled, SSI d/c'd 3/3 Electrolytes: Phos down to 2.1, corrected Ca high ~10.8 (none in TPN); others WNL Renal: ESRD on HD TTS - on schedule Hepatic: LFTs / Tbili / TG wnl. Albumin 1.9, prealbumin 8.9 Intake / Output; MIVF: continues with multiple loose BMs daily, has chronic diarrhea; UOP 0.1 ml/kg/hr GI Imaging: 2/18 CT Abd- Wall thickening of the sigmoid colon and rectum, consistent with acute colitis/proctitis 2/24 CT Abd with IMA occlusion but celiac and SMA patent; no evidence of acute ischemia. Increased distension of the stomach, small bowel and colon since 08/24/2020. 2/25 DG abd- no NGT, Gastric and small bowel dilatation 3/2 CT abd pelvis - likely focal colitis, diverticulosis, new LLL infiltrate, new density within gallbladder (likely sludge or excretion of contrast) GI Surgeries / Procedures: 2/25 EGD- Mildly severe esophagitis with a small 2-3 mm ulcer with no bleeding; Retained fluid and small amounts of solid debris in stomach; attempted NGT multiple times but unable to advance  2/26 Flex sig -severely ulcerated mucosa of the sigmoid colon/  rectum, possibly radiation changes vs ischemia vs infection  Central access: 3/1 TPN start date: 3/1   Nutritional Goals: (per RD recommendations 3/2) kCal: 1900-2100, Protein: 100-115g, Fluid: 1L + UOP per day Goal TPN rate is 65 mL/hr (provides 101g of protein and weekly avg of 1945kcals per day)  Current Nutrition:  TPN CLD started 3/3, tolerating, no advancement per Surgery  Plan:  Continue TPN at goal rate 15mL/hr at 1800 - meeting 100% of estimated weekly protein and kCal needs (50g SMOF lipids on MWF only due to Producer, television/film/video) Electrolytes in TPN: continue same today - Na 110 mEq/L, K 25 mEq/L, Mg 6 mEq/L, remove Ca, Phos 8 mmol/L, Cl:Ac 1:1 Give KPhos 58mmol IV x 1 per discussion with Nephrology MD Add standard MVI and trace elements to TPN, remove chromium with ESRD requiring RRT SSI d/c'd on 3/3 due to lack of utilization - monitor CBGs F/u TPN labs, Surgery plans, GOC (palliative family meeting planned 3/7)   Arturo Morton, PharmD, BCPS Please check AMION for all [place] contact numbers Clinical Pharmacist 09/09/2020 8:42 AM

## 2020-09-09 NOTE — Progress Notes (Signed)
PROGRESS NOTE    Jason FEIGEL Sr.  YBO:[id number] DOB: 1945/07/20 DOA: 08/24/2020 PCP: [name], MD    Brief Narrative:  Jason Cotta Sr. is a 75 year old gentleman with past medical history significant for chronic atrial fibrillation on Coumadin, peptic ulcer disease, ESRD on HD TTS, history of rectal cancer status post low anterior resection and reversal of ileostomy with chemotherapy/radiation who presented to the ED with abdominal pain/cramping, nausea/vomiting/diarrhea and rectal bleeding. Patient with follow-up with his GI physician due to recent hospitalization from GI bleed due to duodenal ulcer, patient had repeat labs performed which was notably positive for C. difficile colitis. Patient was started on medication for his C. difficile infection outpatient, but he noticed a large amount of blood in his stool which was concerning which prompted further evaluation in the ED.  In the ED, patient was found to have an elevated INR, he was given oral vitamin K and started on Protonix. [facility] was consulted. Hospital service consulted for further evaluation and management of GI bleed with supratherapeutic INR in the setting of C. difficile colitis.   Assessment & Plan:   Active Problems:   Hematochezia   GIB (gastrointestinal bleeding)   C. difficile colitis   Generalized abdominal pain   Non-intractable vomiting   Abdominal distension (gaseous)   Gastritis and gastroduodenitis   Noninfectious gastroenteritis   Colon ulcer   Intestinal occlusion (HCC)   Protein-calorie malnutrition, severe   Ischemic colitis ([place])   C. difficile colitis CT angiogram abdomen/pelvis 2/24 suspicious for ileus, no obstruction. Flexible sigmoidoscopy 09/01/2020 with severely ulcerated mucosa at the sigmoid colon/rectum, possibly radiation changes versus ischemia versus infection. --ID/general surgery/[facility] following --WBC 17.4>30.9>35.7>>>12.5>12.2 --Difcid 200mg  PO BID x 10 days --CBC  daily  Acute on chronic GI bleeding Acute blood loss anemia, POA Supratherapeutic INR, POA Patient reports large bloody bowel movement prior to ED presentation. INR notably elevated at 8.8 on admission. Received oral vitamin K for reversal. Recent hospitalization with finding of duodenal ulcer on EGD. Discharged on Protonix twice daily. EGD on 09/01/2020 with no evidence of active bleeding. Colonoscopy 2/26 with findings consistent with severe radiation changes, proctitis, ischemic vs inflammatory colitis, strictures. --[facility] following, appreciate assistance. --Hgb 8.9>6.8>>8.6>7.0>9.6>8.9>9.7>9.1>7.9>7.4>7.2>7.5>7.3 --s/p 2u pRBC 2/19 and 1 u pRBC 2/24 --Aranesp 33mcg every Sat w/ HD --Continue to monitor hemoglobin daily, transfuse for Hgb <7.0 or active bleeding --CBC in am  Nausea/vomiting Ileus Hx rectal cancer s/p LAR, XRT, chemotherapy No gastric outlet obstruction noted on endoscopy. Colonoscopy notable for anal stenosis with rectal stenosis and ulceration from previous surgery/radiation. CT angiogram abdomen/pelvis with occlusion IMA but patent celiac and SMA with collateral filling. --General surgery following, appreciate assistance --CT abdomen/pelvis today with mild increase perirectal inflammatory change consistent with focal colitis, colon decompressed, stomach decompressed, no small bowel dilation --CLD; on TPN; no further advancement of diet per general surgery  Chronic atrial fibrillation on Coumadin Supratherapeutic INR INR 8.8 admission, reversed with oral vitamin K. --Continue to hold Coumadin --Continue to monitor on telemetry  ESRD on HD TTS --Nephrology following, appreciate assistance --Continue HD inpatient per nephrology  Hypoglycemic episodes Etiology likely secondary to prolonged n.p.o. status.  Now on TPN --continue to monitor glucose closely  Adult failure to thrive Severe protein calorie malnutrition 2/2 prolonged illness and ileus Body mass  index is 17.53 kg/m.  Albumin 1.9.  Oral intake limited to ability to tolerate given nausea/vomiting and C. difficile colitis with ileus.  --Continues clear liquid diet without further advancement  per general surgery --Continue TPN  Weakness/debility/deconditioning: Patient with prolonged hospitalization due to ileus and severe C. difficile colitis.  Prior to hospitalization, patient was fairly independent in regards to his ADLs.  His decline during this hospitalization has also been complicated by his prolonged n.p.o. status. --Continue PT/OT efforts while inpatient with plans of SNF discharge when medically stable  Goals of care: --Palliative care following, appreciate assistance.  Discussed with patient and spouse extensively, very poor surgical candidate if needed and possibly would not survive surgical intervention.  Discussed with spouse consideration of comfort measures/palliative if patient does not respond with conservative measures.  She will discuss with her 2 sons before making a decision. --Plan family meeting on Monday at 5 PM with palliative care    DVT prophylaxis: SCDs   Code Status: DNR Family Communication: Updated patient extensively at bedside, no family present this morning  Disposition Plan:  Level of care: Telemetry Medical Status is: Inpatient  Remains inpatient appropriate because:Ongoing diagnostic testing needed not appropriate for outpatient work up, Unsafe d/c plan, IV treatments appropriate due to intensity of illness or inability to take PO and Inpatient level of care appropriate due to severity of illness   Dispo:  Patient From: Home  Planned Disposition: To be determined  Medically stable for discharge: No     Consultants:   [facility]  Nephrology  Palliative care  Infectious disease   Procedures:   EGD  Colonoscopy  Antimicrobials:   Oral vancomycin 2/18 - 2/28; 3/3>>  Metronidazole 2/26 -  3/2    Subjective: Patient seen and examined at bedside, continues reports several bowel movements overnight, although only 1 bowel movement reported in EMR.  Abdominal pain about the same.  No other questions or concerns at this time.  No family present this morning at bedside.  Denies headache, no fever/chills/night sweats, no chest pain, no palpitations, no nausea/vomiting.  Tolerating clear liquid diet.  No acute concerns overnight per nursing staff.  Family meeting planned for tomorrow at 5 PM with palliative care.  Remains very poor prognosis given lack of improvement with medical therapy.  Objective: Vitals:   09/08/20 1630 09/08/20 1700 09/08/20 2031 09/09/20 0505  BP: (!) 154/50 133/71 129/70 131/74  Pulse: (!) 113 (!) 110 (!) 110 (!) 105  Resp:  18 17 17   Temp:  98.5 F (36.9 C) 98 F (36.7 C) 98 F (36.7 C)  TempSrc:  Oral Oral   SpO2:  100% 100% 100%  Height:        Intake/Output Summary (Last 24 hours) at 09/09/2020 1208 Last data filed at 09/09/2020 0600 Gross per 24 hour  Intake 360 ml  Output 2500 ml  Net -2140 ml   Filed Weights    Examination:  General exam: Appears calm and comfortable, chronically ill/cachectic/frail in appearance Respiratory system: Clear to auscultation. Respiratory effort normal. Oxygenating well on room air Cardiovascular system: S1 & S2 heard, RRR. No JVD, murmurs, rubs, gallops or clicks. No pedal edema. Gastrointestinal system: Abdomen is distended, with mild/mod diffuse tenderness to palpation. No organomegaly or masses felt. Normal bowel sounds heard. Central nervous system: Alert and oriented. No focal neurological deficits. Extremities: Symmetric 5 x 5 power. Skin: No rashes, lesions or ulcers Psychiatry: Judgement and insight appear poor. Mood & affect appropriate.     Data Reviewed: I have personally reviewed following labs and imaging studies  CBC: Recent Labs  Lab 09/05/20 0508 09/06/20 0524 09/07/20 0046  09/07/20 1647 09/08/20 0133 09/09/20  0344  WBC 19.4* 16.0* 13.1* 11.3* 12.5* 12.2*  NEUTROABS 16.0* 12.5* 11.7*  --  9.1* 9.0*  HGB 7.7* 7.4* 7.2* 7.6* 7.5* 7.3*  HCT 24.8* 23.9* 22.1* 24.6* 23.8* 22.8*  MCV 87.0 87.9 87.0 87.9 87.5 88.7  PLT 296 252 234 245 239 086   Basic Metabolic Panel: Recent Labs  Lab 09/05/20 0508 09/06/20 0524 09/07/20 0046 09/07/20 1647 09/08/20 0133 09/09/20 0344  NA 136 135 135 133* 133* 136  K 3.2* 3.2* 3.1* 4.0 4.1 3.5  CL 101 102 101 99 99 101  CO2 25 24 27 25 26 28   GLUCOSE 93 117* 106* 120* 118* 108*  BUN 16 31* 16 30* 32* 22  CREATININE 4.34* 5.61* 3.29* 4.04* 4.49* 3.17*  CALCIUM 8.4* 8.8* 8.8* 9.1 9.4 9.2  MG 2.1 1.8 2.2  --  2.2 2.1  PHOS 1.7* 3.2 1.7* 3.1 2.7 2.1*   GFR: Estimated Creatinine Clearance: 15.4 mL/min (A) (by C-G formula based on SCr of 3.17 mg/dL (H)). Liver Function Tests: Recent Labs  Lab 09/04/20 0124 09/05/20 0508 09/06/20 0524 09/07/20 0046 09/07/20 1647 09/08/20 0133  AST 12* 12* 10* 13*  --  24  ALT 8 9 9 9   --  14  ALKPHOS 92 84 73 73  --  83  BILITOT 0.6 0.3 0.1* 0.4  --  0.3  PROT 5.1* 4.9* 4.7* 4.9*  --  5.3*  ALBUMIN 1.9* 1.9* 1.8* 1.8* 2.0* 1.9*   No results for input(s): LIPASE, AMYLASE in the last 168 hours. No results for input(s): AMMONIA in the last 168 hours. Coagulation Profile: No results for input(s): INR, PROTIME in the last 168 hours. Cardiac Enzymes: No results for input(s): CKTOTAL, CKMB, CKMBINDEX, TROPONINI in the last 168 hours. BNP (last 3 results) No results for input(s): PROBNP in the last 8760 hours. HbA1C: No results for input(s): HGBA1C in the last 72 hours. CBG: Recent Labs  Lab 09/07/20 0353 09/07/20 0817 09/07/20 1302 09/08/20 1155 09/09/20 0842  GLUCAP 116* 116* 130* 93 142*   Lipid Profile: No results for input(s): CHOL, HDL, LDLCALC, TRIG, CHOLHDL, LDLDIRECT in the last 72 hours. Thyroid Function Tests: No results for input(s): TSH, T4TOTAL, FREET4,  T3FREE, THYROIDAB in the last 72 hours. Anemia Panel: No results for input(s): VITAMINB12, FOLATE, FERRITIN, TIBC, IRON, RETICCTPCT in the last 72 hours. Sepsis Labs: No results for input(s): PROCALCITON, LATICACIDVEN in the last 168 hours.  No results found for this or any previous visit (from the past 240 hour(s)).       Radiology Studies: No results found.      Scheduled Meds: . sodium chloride   Intravenous Once  . Chlorhexidine Gluconate Cloth  6 each Topical Q0600  . Chlorhexidine Gluconate Cloth  6 each Topical Q0600  . Chlorhexidine Gluconate Cloth  6 each Topical Q0600  . darbepoetin (ARANESP) injection - DIALYSIS  200 mcg Intravenous Q Sat-HD  . fidaxomicin  200 mg Oral BID  . niacin  250 mg Oral BID WC  . pantoprazole  40 mg Oral BID  . sodium chloride flush  10-40 mL Intracatheter Q12H   Continuous Infusions: . potassium PHOSPHATE IVPB (in mmol) 20 mmol (09/09/20 1124)  . TPN ADULT (ION) 65 mL/hr at 09/08/20 1849  . TPN ADULT (ION)       LOS: 16 days    Time spent: 37 minutes spent on chart review, discussion with nursing staff, consultants, updating family and interview/physical exam; more than 50% of that time was spent  in counseling and/or coordination of care.    Bindu Docter J [place] DO [facility] Available via Epic secure chat 7am-7pm After these hours, please refer to coverage provider listed on [email or web address] 09/09/2020, 12:08 PM

## 2020-09-09 NOTE — Progress Notes (Signed)
[facility] Progress Note  8 Days Post-Op  Subjective: CC-  Much more awake/alert this morning. Continues to complain of diffuse abdominal pain, but states that it is a little better than yesterday. Some persistent nausea, no emesis. Tolerating clear liquids. He reports having about 3 loose BMs yesterday. WBC stable at 12.2, afebrile.  Objective: Vital signs in last 24 hours: Temp:  [98 F (36.7 C)-98.6 F (37 C)] 98 F (36.7 C) (03/06 0505) Pulse Rate:  [102-115] 105 (03/06 0505) Resp:  [17-18] 17 (03/06 0505) BP: (129-166)/(50-84) 131/74 (03/06 0505) SpO2:  [100 %] 100 % (03/06 0505) Last BM Date: 09/08/20  Intake/Output from previous day: 03/05 0701 - 03/06 0700 In: 485.1 [P.O.:360; I.V.:125.1] Out: 2500  Intake/Output this shift: No intake/output data recorded.  PE: General: Alert, NAD Lungs: rate and effort normal Abd: soft,ND, generalized TTP (worse in the left than right hemiabdomen) without peritonitis or guarding, no masses, hernias, or organomegaly MS: all 4 extremities are symmetrical with no cyanosis, clubbing, or edema. Skin: warm and dry   Lab Results:  Recent Labs    09/08/20 0133 09/09/20 0344  WBC 12.5* 12.2*  HGB 7.5* 7.3*  HCT 23.8* 22.8*  PLT 239 227   BMET Recent Labs    09/08/20 0133 09/09/20 0344  NA 133* 136  K 4.1 3.5  CL 99 101  CO2 26 28  GLUCOSE 118* 108*  BUN 32* 22  CREATININE 4.49* 3.17*  CALCIUM 9.4 9.2   PT/INR No results for input(s): LABPROT, INR in the last 72 hours. CMP     Component Value Date/Time   NA 136 09/09/2020 0344   NA 140 02/24/2017 0000   NA 138 02/20/2014 1432   K 3.5 09/09/2020 0344   K 3.5 02/20/2014 1432   CL 101 09/09/2020 0344   CL 101 12/09/2012 1048   CO2 28 09/09/2020 0344   CO2 30 (H) 02/20/2014 1432   GLUCOSE 108 (H) 09/09/2020 0344   GLUCOSE 93 02/20/2014 1432   GLUCOSE 96 12/09/2012 1048   BUN 22 09/09/2020 0344   BUN 50 (A) 08/10/2017 0000   BUN 36.0 (H)  02/20/2014 1432   CREATININE 3.17 (H) 09/09/2020 0344   CREATININE 2.4 (H) 02/20/2014 1432   CALCIUM 9.2 09/09/2020 0344   CALCIUM 10.4 02/20/2014 1432   PROT 5.3 (L) 09/08/2020 0133   PROT 7.2 02/20/2014 1432   ALBUMIN 1.9 (L) 09/08/2020 0133   ALBUMIN 3.8 02/20/2014 1432   AST 24 09/08/2020 0133   AST 22 02/20/2014 1432   ALT 14 09/08/2020 0133   ALT 12 02/20/2014 1432   ALKPHOS 83 09/08/2020 0133   ALKPHOS 86 02/20/2014 1432   BILITOT 0.3 09/08/2020 0133   BILITOT 0.32 02/20/2014 1432   GFRNONAA 20 (L) 09/09/2020 0344   GFRAA 8 (L) 04/08/2019 1702   Lipase     Component Value Date/Time   LIPASE 47 08/15/2020 0659       Studies/Results: No results found.  Anti-infectives: Anti-infectives (From admission, onward)   Start     Dose/Rate Route Frequency Ordered Stop   09/06/20 2200  fidaxomicin (DIFICID) tablet 200 mg        200 mg Oral 2 times daily 09/06/20 1530 09/16/20 2159   09/06/20 0930  vancomycin (VANCOCIN) 50 mg/mL oral solution 500 mg  Status:  Discontinued        500 mg Oral Every 6 hours 09/06/20 0830 09/06/20 1530   09/01/20 1600  metroNIDAZOLE (FLAGYL) IVPB 500 mg  Status:  Discontinued        500 mg 100 mL/hr over 60 Minutes Intravenous Every 12 hours 09/01/20 1315 09/05/20 1430   08/26/20 1000  vancomycin (VANCOCIN) 50 mg/mL oral solution 500 mg        500 mg Oral 4 times daily 08/26/20 0740 09/03/20 2159   08/24/20 2100  vancomycin (VANCOCIN) 50 mg/mL oral solution 125 mg  Status:  Discontinued        125 mg Oral 4 times daily 08/24/20 1717 08/26/20 0740       Assessment/Plan Hx of rectal CA s/p LAR and chemo/radiation  HTN Hx of CVA COPD ESRD on HD TTS A. Fib on coumadin - coumadin held Code status DNR/DNI - palliative following, planning for family meeting Monday at 5pm  C diff colitis, Ischemic colitis -Patient continues to have generalized pain but abdomen is soft without signs of peritonitis. No signs of pneumatosis or portal venous  gas on CT, although there is persistent thickening consistent with colitis. Surgical resection would be high risk given prior radiation to the pelvis and prior rectal dissection with coloanal anastomosis. For now recommend continued medical management of colitis. Ensure patient stays adequately hydrated and maintain normotension to maximize perfusion of the colon. - Given persistent pain and CT findings,abx were resumed 3/3 -Ok for sips of clear liquids, but do not advance diet any further. Continue TPN for nutritional support. - We will continue to follow closely  FEN: CLD, IVF; TPN VTE: heparin with HD ID: PO vanc 2/18>2/28; IV flagyl 1/26>3/2; PO dificid 3/3>>  Plan: WBCstable at 12.2 from 12.5, he is afebrile. Still with abdominal pain but no peritonitis on exam. Continue current regimen as above with medical management. Would not advance past clear liquids at this point. Continue TPN for nutritional support. Mobilize as able. Palliative planning for family meeting on Monday.   LOS: 16 days    [facility] 09/09/2020, 7:56 AM Please see Amion for pager number during day hours 7:00am-4:30pm

## 2020-09-09 NOTE — Progress Notes (Addendum)
Subjective: Tolerated dialysis yesterday 2.3 L UF, this a.m. abdominal pain improving able to tolerate some liquids this a.m.  Objective Vital signs in last 24 hours: Vitals:   09/08/20 1630 09/08/20 1700 09/08/20 2031 09/09/20 0505  BP: (!) 154/50 133/71 129/70 131/74  Pulse: (!) 113 (!) 110 (!) 110 (!) 105  Resp:  18 17 17   Temp:  98.5 F (36.9 C) 98 F (36.7 C) 98 F (36.7 C)  TempSrc:  Oral Oral   SpO2:  100% 100% 100%  Height:       Weight change:   Physical Exam General: elderly, chronically ill appearing male,NAD Heart:tachycardia, RR, no mrg Lungs CTAB, nonlabored breathing Abdomen: BS positive soft, ND, mildly tender throughout but improved from yesterday Extremities:no LE edema Dialysis Access: LU AVF +bruit  OP HD: [facility] TTS  4h 450/500 58kg 3K/2Ca P2 L AVF Heparin none  Mircera 50 (to start 2/22) Venofer 50  Hectorol 7   Problem/Plan: 1. C. Diff colitis - on po Vanc/Florastor w/ persistent abd pain and diarrhea. Flex sig 2/26 showed severely ulcerated mucosa of the sigmoid colon/ rectum, possibly radiation changes vs ischemia vs infection. Per primary/GI/CCS. Now needing TPN.  Also on dificid,  2. GI bleed/ ABL anemia: w/ supra therapeutic INR on admit (EGD2/9with duodenal ulcer, treated w/ clip+ injection). On Protonix. s/p 1unit pRBC 2/24. dec in hgb this week-  3. ESRD - HD TTS.  HD on schedule next HD 3/08-Tuesday.No heparin . K  3.5 noted alsoon TPN 4. AFib- on coumadin. Supratherapeutic INR on admission.Reversed  5. HTN/Volume -a.m. BP 131/74.  No BP meds/under dry wt.Challenge as able with HD now on continuous TPN 6. Anemia CKD --see #1. Hgb7.3<7.5,  has had 3u prbc's here total. Continue aranesp - increased dose to 200qwk q Sat 7. MBD CKD --Corr Ca^.   Will hold  hectorol, Phos 2.1, no binders,on TPN 8. COPD  9. H/o prior rectal cancer - in 2014 treated w/ chemoRx,radiation, laparoscopic low anterior resection with  diverting loop ileostomy and subsequent ileostomy takedown. 10. GOC - seen by palliative care, pt is now DNR  Ernest Haber, PA-C [place] [phone] 09/09/2020,8:26 AM  LOS: 16 days    Patient seen and examined, agree with above note with above modifications. Seems better today- brighter-  From our standpoint, continue with routine HD TTS and appropriate titration of dialysis related meds  Corliss Parish, MD 09/09/2020     Labs: Basic Metabolic Panel: Recent Labs  Lab 09/07/20 1647 09/08/20 0133 09/09/20 0344  NA 133* 133* 136  K 4.0 4.1 3.5  CL 99 99 101  CO2 25 26 28   GLUCOSE 120* 118* 108*  BUN 30* 32* 22  CREATININE 4.04* 4.49* 3.17*  CALCIUM 9.1 9.4 9.2  PHOS 3.1 2.7 2.1*   Liver Function Tests: Recent Labs  Lab 09/06/20 0524 09/07/20 0046 09/07/20 1647 09/08/20 0133  AST 10* 13*  --  24  ALT 9 9  --  14  ALKPHOS 73 73  --  83  BILITOT 0.1* 0.4  --  0.3  PROT 4.7* 4.9*  --  5.3*  ALBUMIN 1.8* 1.8* 2.0* 1.9*   No results for input(s): LIPASE, AMYLASE in the last 168 hours. No results for input(s): AMMONIA in the last 168 hours. CBC: Recent Labs  Lab 09/06/20 0524 09/07/20 0046 09/07/20 1647 09/08/20 0133 09/09/20 0344  WBC 16.0* 13.1* 11.3* 12.5* 12.2*  NEUTROABS 12.5* 11.7*  --  9.1* 9.0*  HGB 7.4* 7.2*  7.6* 7.5* 7.3*  HCT 23.9* 22.1* 24.6* 23.8* 22.8*  MCV 87.9 87.0 87.9 87.5 88.7  PLT 252 234 245 239 227   Cardiac Enzymes: No results for input(s): CKTOTAL, CKMB, CKMBINDEX, TROPONINI in the last 168 hours. CBG: Recent Labs  Lab 09/07/20 0038 09/07/20 0353 09/07/20 0817 09/07/20 1302 09/08/20 1155  GLUCAP 116* 116* 116* 130* 93    Studies/Results: No results found. Medications: . TPN ADULT (ION) 65 mL/hr at 09/08/20 1849   . sodium chloride   Intravenous Once  . Chlorhexidine Gluconate Cloth  6 each Topical Q0600  . Chlorhexidine Gluconate Cloth  6 each Topical Q0600  . Chlorhexidine Gluconate Cloth  6  each Topical Q0600  . darbepoetin (ARANESP) injection - DIALYSIS  200 mcg Intravenous Q Sat-HD  . fidaxomicin  200 mg Oral BID  . niacin  250 mg Oral BID WC  . pantoprazole  40 mg Oral BID  . sodium chloride flush  10-40 mL Intracatheter Q12H

## 2020-09-10 ENCOUNTER — Ambulatory Visit: Payer: Medicare Other

## 2020-09-10 DIAGNOSIS — R1084 Generalized abdominal pain: Secondary | ICD-10-CM | POA: Diagnosis not present

## 2020-09-10 DIAGNOSIS — A0472 Enterocolitis due to Clostridium difficile, not specified as recurrent: Secondary | ICD-10-CM | POA: Diagnosis not present

## 2020-09-10 DIAGNOSIS — K2970 Gastritis, unspecified, without bleeding: Secondary | ICD-10-CM | POA: Diagnosis not present

## 2020-09-10 DIAGNOSIS — Z66 Do not resuscitate: Secondary | ICD-10-CM | POA: Diagnosis not present

## 2020-09-10 DIAGNOSIS — Z7189 Other specified counseling: Secondary | ICD-10-CM | POA: Diagnosis not present

## 2020-09-10 DIAGNOSIS — Z515 Encounter for palliative care: Secondary | ICD-10-CM | POA: Diagnosis not present

## 2020-09-10 DIAGNOSIS — K633 Ulcer of intestine: Secondary | ICD-10-CM | POA: Diagnosis not present

## 2020-09-10 LAB — CBC WITH DIFFERENTIAL/PLATELET
Abs Immature Granulocytes: 0.61 10*3/uL — ABNORMAL HIGH (ref 0.00–0.07)
Basophils Absolute: 0.1 10*3/uL (ref 0.0–0.1)
Basophils Relative: 0 %
Eosinophils Absolute: 0.1 10*3/uL (ref 0.0–0.5)
Eosinophils Relative: 1 %
HCT: 25 % — ABNORMAL LOW (ref 39.0–52.0)
Hemoglobin: 7.9 g/dL — ABNORMAL LOW (ref 13.0–17.0)
Immature Granulocytes: 4 %
Lymphocytes Relative: 5 %
Lymphs Abs: 0.8 10*3/uL (ref 0.7–4.0)
MCH: 28.3 pg (ref 26.0–34.0)
MCHC: 31.6 g/dL (ref 30.0–36.0)
MCV: 89.6 fL (ref 80.0–100.0)
Monocytes Absolute: 1.7 10*3/uL — ABNORMAL HIGH (ref 0.1–1.0)
Monocytes Relative: 10 %
Neutro Abs: 13.7 10*3/uL — ABNORMAL HIGH (ref 1.7–7.7)
Neutrophils Relative %: 80 %
Platelets: 242 10*3/uL (ref 150–400)
RBC: 2.79 MIL/uL — ABNORMAL LOW (ref 4.22–5.81)
RDW: 23 % — ABNORMAL HIGH (ref 11.5–15.5)
WBC: 16.9 10*3/uL — ABNORMAL HIGH (ref 4.0–10.5)
nRBC: 0.1 % (ref 0.0–0.2)

## 2020-09-10 LAB — COMPREHENSIVE METABOLIC PANEL
ALT: 49 U/L — ABNORMAL HIGH (ref 0–44)
AST: 38 U/L (ref 15–41)
Albumin: 2.1 g/dL — ABNORMAL LOW (ref 3.5–5.0)
Alkaline Phosphatase: 93 U/L (ref 38–126)
Anion gap: 10 (ref 5–15)
BUN: 35 mg/dL — ABNORMAL HIGH (ref 8–23)
CO2: 25 mmol/L (ref 22–32)
Calcium: 10 mg/dL (ref 8.9–10.3)
Chloride: 98 mmol/L (ref 98–111)
Creatinine, Ser: 4.62 mg/dL — ABNORMAL HIGH (ref 0.61–1.24)
GFR, Estimated: 13 mL/min — ABNORMAL LOW (ref >60)
Glucose, Bld: 118 mg/dL — ABNORMAL HIGH (ref 70–99)
Potassium: 4 mmol/L (ref 3.5–5.1)
Sodium: 133 mmol/L — ABNORMAL LOW (ref 135–145)
Total Bilirubin: 0.6 mg/dL (ref 0.3–1.2)
Total Protein: 5.4 g/dL — ABNORMAL LOW (ref 6.5–8.1)

## 2020-09-10 LAB — GLUCOSE, CAPILLARY
Glucose-Capillary: 111 mg/dL — ABNORMAL HIGH (ref 70–99)
Glucose-Capillary: 114 mg/dL — ABNORMAL HIGH (ref 70–99)
Glucose-Capillary: 115 mg/dL — ABNORMAL HIGH (ref 70–99)
Glucose-Capillary: 108 mg/dL — ABNORMAL HIGH (ref 70–99)
Glucose-Capillary: 106 mg/dL — ABNORMAL HIGH (ref 70–99)

## 2020-09-10 LAB — TRIGLYCERIDES: Triglycerides: 54 mg/dL (ref <150)

## 2020-09-10 LAB — PHOSPHORUS: Phosphorus: 2.8 mg/dL (ref 2.5–4.6)

## 2020-09-10 LAB — PREALBUMIN: Prealbumin: 25.1 mg/dL (ref 18–38)

## 2020-09-10 LAB — MAGNESIUM: Magnesium: 2.1 mg/dL (ref 1.7–2.4)

## 2020-09-10 MED ORDER — CHLORHEXIDINE GLUCONATE CLOTH 2 % EX PADS
6.0000 | MEDICATED_PAD | Freq: Every day | CUTANEOUS | Status: DC
  Administered 2020-09-10 – 2020-09-13 (×4): 6 via TOPICAL

## 2020-09-10 MED ORDER — TRACE MINERALS CU-MN-SE-ZN 300-55-60-3000 MCG/ML IV SOLN
INTRAVENOUS | Status: AC
  Filled 2020-09-10: qty 728

## 2020-09-10 NOTE — Progress Notes (Signed)
Subjective: pt seen in room, no c/o today.   Objective Vital signs in last 24 hours: Vitals:   09/09/20 0505 09/09/20 1415 09/09/20 2110 09/10/20 0335  BP: 131/74 (!) 143/80 (!) 159/79 (!) 155/73  Pulse: (!) 105 (!) 102 (!) 107 99  Resp: 17 18 16 17   Temp: 98 F (36.7 C) 97.8 F (36.6 C) 98.9 F (37.2 C) 97.7 F (36.5 C)  TempSrc:  Oral Oral Oral  SpO2: 100% 100% 100% 100%  Height:       Weight change:   Physical Exam General: elderly, chronically ill appearing male,NAD Heart:tachycardia, RR, no mrg Lungs CTAB, nonlabored breathing Abdomen: BS positive soft, ND, mildly tender throughout but improved from yesterday Extremities:no LE edema Dialysis Access: LU AVF +bruit  OP HD: [facility] TTS  4h 450/500 58kg 3K/2Ca P2 L AVF Heparin none  Mircera 50 (to start 2/22) Venofer 50  Hectorol 7   Problem/Plan: 1. C. Diff colitis - on po Vanc/Florastor/ dificid. Having persistent abd pain and diarrhea. Flex sig 2/26 showed severely ulcerated mucosa of the sigmoid colon/ rectum, possibly radiation changes vs ischemia vs infection. Per primary/GI/CCS.   2. GI bleed/ ABL anemia: w/ supra therapeutic INR on admit. SP EGD2/9with duodenal ulcer, treated w/ clip+ injection. On Protonix. s/p 1unit pRBC 2/24. dec in hgb this week.  3. Nutrition - is now getting IV TPN   4. ESRD - HD TTS.  Next HD 3/08 Tuesday.No heparin . 5. AFib- on coumadin. Supratherapeutic INR on admission.Reversed  6. HTN/Volume -a.m. BP 131/74.  No BP meds/under dry wt.Challenge as able with HD now on continuous TPN 7. Anemia CKD --see #1. Hgb7.3<7.5,  has had 3u prbc's here total. Continue aranesp - increased dose to 200qwk q Sat 8. MBD CKD --Corr Ca^. Holding hectorol, Phos 2.1, no binders,on TPN 9. COPD  10. H/o prior rectal cancer - in 2014 treated w/ chemoRx,radiation, laparoscopic low anterior resection with diverting loop ileostomy and subsequent ileostomy takedown. 11. GOC - seen  by palliative care, pt is DNR  Kelly Splinter, MD 09/10/2020, 1:17 PM       Labs: Basic Metabolic Panel: Recent Labs  Lab 09/08/20 0133 09/09/20 0344 09/10/20 0330  NA 133* 136 133*  K 4.1 3.5 4.0  CL 99 101 98  CO2 26 28 25   GLUCOSE 118* 108* 118*  BUN 32* 22 35*  CREATININE 4.49* 3.17* 4.62*  CALCIUM 9.4 9.2 10.0  PHOS 2.7 2.1* 2.8   Liver Function Tests: Recent Labs  Lab 09/07/20 0046 09/07/20 1647 09/08/20 0133 09/10/20 0330  AST 13*  --  24 38  ALT 9  --  14 49*  ALKPHOS 73  --  83 93  BILITOT 0.4  --  0.3 0.6  PROT 4.9*  --  5.3* 5.4*  ALBUMIN 1.8* 2.0* 1.9* 2.1*   No results for input(s): LIPASE, AMYLASE in the last 168 hours. No results for input(s): AMMONIA in the last 168 hours. CBC: Recent Labs  Lab 09/07/20 0046 09/07/20 1647 09/08/20 0133 09/09/20 0344 09/10/20 0330  WBC 13.1* 11.3* 12.5* 12.2* 16.9*  NEUTROABS 11.7*  --  9.1* 9.0* 13.7*  HGB 7.2* 7.6* 7.5* 7.3* 7.9*  HCT 22.1* 24.6* 23.8* 22.8* 25.0*  MCV 87.0 87.9 87.5 88.7 89.6  PLT 234 245 239 227 242   Cardiac Enzymes: No results for input(s): CKTOTAL, CKMB, CKMBINDEX, TROPONINI in the last 168 hours. CBG: Recent Labs  Lab 09/09/20 0842 09/09/20 1736 09/10/20 0602 09/10/20 0810 09/10/20 1158  GLUCAP  142* 117* 111* 114* 115*    Studies/Results: No results found. Medications: . TPN ADULT (ION) 65 mL/hr at 09/09/20 1719  . TPN ADULT (ION)     . sodium chloride   Intravenous Once  . Chlorhexidine Gluconate Cloth  6 each Topical Q0600  . darbepoetin (ARANESP) injection - DIALYSIS  200 mcg Intravenous Q Sat-HD  . fidaxomicin  200 mg Oral BID  . niacin  250 mg Oral BID WC  . pantoprazole  40 mg Oral BID  . sodium chloride flush  10-40 mL Intracatheter Q12H

## 2020-09-10 NOTE — Social Work (Signed)
CSW will follow up with pt and family after [place] meeting with pallaitive scheduled for 3/7 at [place], [place], [place] Social Worker [phone]

## 2020-09-10 NOTE — Progress Notes (Signed)
PROGRESS NOTE    Jason LOFQUIST Sr.  TDD:[id number] DOB: 1945/08/10 DOA: 08/24/2020 PCP: [name], MD    Brief Narrative:  Jason Cotta Sr. is a 75 year old gentleman with past medical history significant for chronic atrial fibrillation on Coumadin, peptic ulcer disease, ESRD on HD TTS, history of rectal cancer status post low anterior resection and reversal of ileostomy with chemotherapy/radiation who presented to the ED with abdominal pain/cramping, nausea/vomiting/diarrhea and rectal bleeding. Patient with follow-up with his GI physician due to recent hospitalization from GI bleed due to duodenal ulcer, patient had repeat labs performed which was notably positive for C. difficile colitis. Patient was started on medication for his C. difficile infection outpatient, but he noticed a large amount of blood in his stool which was concerning which prompted further evaluation in the ED.  In the ED, patient was found to have an elevated INR, he was given oral vitamin K and started on Protonix. [facility] was consulted. Hospital service consulted for further evaluation and management of GI bleed with supratherapeutic INR in the setting of C. difficile colitis.   Assessment & Plan:   Active Problems:   Hematochezia   GIB (gastrointestinal bleeding)   C. difficile colitis   Generalized abdominal pain   Non-intractable vomiting   Abdominal distension (gaseous)   Gastritis and gastroduodenitis   Noninfectious gastroenteritis   Colon ulcer   Intestinal occlusion (HCC)   Protein-calorie malnutrition, severe   Ischemic colitis ([place])   C. difficile colitis CT angiogram abdomen/pelvis 2/24 suspicious for ileus, no obstruction. Flexible sigmoidoscopy 09/01/2020 with severely ulcerated mucosa at the sigmoid colon/rectum, possibly radiation changes versus ischemia versus infection. --ID/general surgery/[facility] following --WBC 17.4>30.9>35.7>>>12.5>12.2>16.8 --Difcid 200mg  PO BID x 10  days --CBC daily --General plans repeat CT abdomen/pelvis in the a.m.  Acute on chronic GI bleeding Acute blood loss anemia, POA Supratherapeutic INR, POA Patient reports large bloody bowel movement prior to ED presentation. INR notably elevated at 8.8 on admission. Received oral vitamin K for reversal. Recent hospitalization with finding of duodenal ulcer on EGD. Discharged on Protonix twice daily. EGD on 09/01/2020 with no evidence of active bleeding. Colonoscopy 2/26 with findings consistent with severe radiation changes, proctitis, ischemic vs inflammatory colitis, strictures. --[facility] following, appreciate assistance. --Hgb 8.9>6.8>>8.6>7.0>9.6>8.9>9.7>9.1>7.9>7.4>7.2>7.5>7.3>7.9 --s/p 2u pRBC 2/19 and 1 u pRBC 2/24 --Aranesp 28mcg every Sat w/ HD --Continue to monitor hemoglobin daily, transfuse for Hgb <7.0 or active bleeding --CBC in am  Nausea/vomiting Ileus Hx rectal cancer s/p LAR, XRT, chemotherapy No gastric outlet obstruction noted on endoscopy. Colonoscopy notable for anal stenosis with rectal stenosis and ulceration from previous surgery/radiation. CT angiogram abdomen/pelvis with occlusion IMA but patent celiac and SMA with collateral filling. --General surgery following, appreciate assistance --CT abdomen/pelvis today with mild increase perirectal inflammatory change consistent with focal colitis, colon decompressed, stomach decompressed, no small bowel dilation --CLD; on TPN; no further advancement of diet per general surgery --plan repeat CT abdomen/pelvis as above in am  Chronic atrial fibrillation on Coumadin Supratherapeutic INR INR 8.8 admission, reversed with oral vitamin K. --Continue to hold Coumadin --Continue to monitor on telemetry  ESRD on HD TTS --Nephrology following, appreciate assistance --Continue HD inpatient per nephrology  Hypoglycemic episodes Etiology likely secondary to prolonged n.p.o. status.  Now on TPN --continue to monitor glucose  closely  Adult failure to thrive Severe protein calorie malnutrition 2/2 prolonged illness and ileus Body mass index is 17.53 kg/m.  Albumin 1.9.  Oral intake limited to ability to tolerate  given nausea/vomiting and C. difficile colitis with ileus.  --Continues clear liquid diet without further advancement per general surgery --Continue TPN  Weakness/debility/deconditioning: Patient with prolonged hospitalization due to ileus and severe C. difficile colitis.  Prior to hospitalization, patient was fairly independent in regards to his ADLs.  His decline during this hospitalization has also been complicated by his prolonged n.p.o. status. --Continue PT/OT efforts while inpatient with plans of SNF discharge when medically stable  Goals of care: --Palliative care following, appreciate assistance.  Discussed with patient and spouse extensively, very poor surgical candidate if needed and possibly would not survive surgical intervention.  Discussed with spouse consideration of comfort measures/palliative if patient does not respond with conservative measures.  She will discuss with her 2 sons before making a decision. --Plan family meeting planned for today with palliative care    DVT prophylaxis: SCDs   Code Status: DNR Family Communication: Updated patient's brother who is present at bedside this morning.  Disposition Plan:  Level of care: Telemetry Medical Status is: Inpatient  Remains inpatient appropriate because:Ongoing diagnostic testing needed not appropriate for outpatient work up, Unsafe d/c plan, IV treatments appropriate due to intensity of illness or inability to take PO and Inpatient level of care appropriate due to severity of illness   Dispo:  Patient From: Home  Planned Disposition: To be determined  Medically stable for discharge: No     Consultants:   [facility]  Nephrology  Palliative care  Infectious disease   Procedures:    EGD  Colonoscopy  Antimicrobials:   Oral vancomycin 2/18 - 2/28; 3/3>>  Metronidazole 2/26 - 3/2    Subjective: Patient seen and examined at bedside, therapy this morning.  Brother present and updated.  Continues with multiple bowel movements, although reports his abdominal pain is improved from yesterday.  Also reports continued nausea that is intermittent and not associated with eating or drinking.  White cell count has increased slightly today.  Seen by general surgery this morning with repeat CT abdomen/pelvis in the a.m.  Plan family meeting later this afternoon with palliative care.  Overall remains very poor prognosis given lack of improvement with medical therapy, and this was discussed with patient's brother who is present this morning.   Objective: Vitals:   09/09/20 0505 09/09/20 1415 09/09/20 2110 09/10/20 0335  BP: 131/74 (!) 143/80 (!) 159/79 (!) 155/73  Pulse: (!) 105 (!) 102 (!) 107 99  Resp: 17 18 16 17   Temp: 98 F (36.7 C) 97.8 F (36.6 C) 98.9 F (37.2 C) 97.7 F (36.5 C)  TempSrc:  Oral Oral Oral  SpO2: 100% 100% 100% 100%  Height:        Intake/Output Summary (Last 24 hours) at 09/10/2020 1140 Last data filed at 09/10/2020 0900 Gross per 24 hour  Intake 470 ml  Output 400 ml  Net 70 ml   Filed Weights    Examination:  General exam: Appears calm and comfortable, chronically ill/cachectic/frail in appearance Respiratory system: Clear to auscultation. Respiratory effort normal. Oxygenating well on room air Cardiovascular system: S1 & S2 heard, RRR. No JVD, murmurs, rubs, gallops or clicks. No pedal edema. Gastrointestinal system: Abdomen is distended, with mild/mod diffuse tenderness to palpation. No organomegaly or masses felt. Normal bowel sounds heard. Central nervous system: Alert and oriented. No focal neurological deficits. Extremities: Symmetric 5 x 5 power. Skin: No rashes, lesions or ulcers Psychiatry: Judgement and insight appear poor.  Mood & affect appropriate.     Data  Reviewed: I have personally reviewed following labs and imaging studies  CBC: Recent Labs  Lab 09/06/20 0524 09/07/20 0046 09/07/20 1647 09/08/20 0133 09/09/20 0344 09/10/20 0330  WBC 16.0* 13.1* 11.3* 12.5* 12.2* 16.9*  NEUTROABS 12.5* 11.7*  --  9.1* 9.0* 13.7*  HGB 7.4* 7.2* 7.6* 7.5* 7.3* 7.9*  HCT 23.9* 22.1* 24.6* 23.8* 22.8* 25.0*  MCV 87.9 87.0 87.9 87.5 88.7 89.6  PLT 252 234 245 239 227 001   Basic Metabolic Panel: Recent Labs  Lab 09/06/20 0524 09/07/20 0046 09/07/20 1647 09/08/20 0133 09/09/20 0344 09/10/20 0330  NA 135 135 133* 133* 136 133*  K 3.2* 3.1* 4.0 4.1 3.5 4.0  CL 102 101 99 99 101 98  CO2 24 27 25 26 28 25   GLUCOSE 117* 106* 120* 118* 108* 118*  BUN 31* 16 30* 32* 22 35*  CREATININE 5.61* 3.29* 4.04* 4.49* 3.17* 4.62*  CALCIUM 8.8* 8.8* 9.1 9.4 9.2 10.0  MG 1.8 2.2  --  2.2 2.1 2.1  PHOS 3.2 1.7* 3.1 2.7 2.1* 2.8   GFR: Estimated Creatinine Clearance: 10.6 mL/min (A) (by C-G formula based on SCr of 4.62 mg/dL (H)). Liver Function Tests: Recent Labs  Lab 09/05/20 0508 09/06/20 0524 09/07/20 0046 09/07/20 1647 09/08/20 0133 09/10/20 0330  AST 12* 10* 13*  --  24 38  ALT 9 9 9   --  14 49*  ALKPHOS 84 73 73  --  83 93  BILITOT 0.3 0.1* 0.4  --  0.3 0.6  PROT 4.9* 4.7* 4.9*  --  5.3* 5.4*  ALBUMIN 1.9* 1.8* 1.8* 2.0* 1.9* 2.1*   No results for input(s): LIPASE, AMYLASE in the last 168 hours. No results for input(s): AMMONIA in the last 168 hours. Coagulation Profile: No results for input(s): INR, PROTIME in the last 168 hours. Cardiac Enzymes: No results for input(s): CKTOTAL, CKMB, CKMBINDEX, TROPONINI in the last 168 hours. BNP (last 3 results) No results for input(s): PROBNP in the last 8760 hours. HbA1C: No results for input(s): HGBA1C in the last 72 hours. CBG: Recent Labs  Lab 09/08/20 1155 09/09/20 0842 09/09/20 1736 09/10/20 0602 09/10/20 0810  GLUCAP 93 142* 117* 111* 114*    Lipid Profile: Recent Labs    09/10/20 0330  TRIG 54   Thyroid Function Tests: No results for input(s): TSH, T4TOTAL, FREET4, T3FREE, THYROIDAB in the last 72 hours. Anemia Panel: No results for input(s): VITAMINB12, FOLATE, FERRITIN, TIBC, IRON, RETICCTPCT in the last 72 hours. Sepsis Labs: No results for input(s): PROCALCITON, LATICACIDVEN in the last 168 hours.  No results found for this or any previous visit (from the past 240 hour(s)).       Radiology Studies: No results found.      Scheduled Meds: . sodium chloride   Intravenous Once  . Chlorhexidine Gluconate Cloth  6 each Topical Q0600  . darbepoetin (ARANESP) injection - DIALYSIS  200 mcg Intravenous Q Sat-HD  . fidaxomicin  200 mg Oral BID  . niacin  250 mg Oral BID WC  . pantoprazole  40 mg Oral BID  . sodium chloride flush  10-40 mL Intracatheter Q12H   Continuous Infusions: . TPN ADULT (ION) 65 mL/hr at 09/09/20 1719  . TPN ADULT (ION)       LOS: 17 days    Time spent: 37 minutes spent on chart review, discussion with nursing staff, consultants, updating family and interview/physical exam; more than 50% of that time was spent in counseling and/or coordination of care.  Alper Guilmette J [place] DO [facility] Available via Epic secure chat 7am-7pm After these hours, please refer to coverage provider listed on [email or web address] 09/10/2020, 11:40 AM

## 2020-09-10 NOTE — Progress Notes (Addendum)
Daily Rounding Note  09/10/2020, 11:26 AM  LOS: 17 days   SUBJECTIVE:   Chief complaint: diarrhea, abdominal pain, C diff, colonic ischemia.  Frequent diarrhea persists. Says his abdominal pain feels a little bit better. Latest IV Dilaudid 0.5 mg at 0645 this AM.   Endorses nausea but no vomiting.   Tolerating clear liquids.     OBJECTIVE:         Vital signs in last 24 hours:    Temp:  [97.7 F (36.5 C)-98.9 F (37.2 C)] 97.7 F (36.5 C) (03/07 0335) Pulse Rate:  [99-107] 99 (03/07 0335) Resp:  [16-18] 17 (03/07 0335) BP: (143-159)/(73-80) 155/73 (03/07 0335) SpO2:  [100 %] 100 % (03/07 0335) Last BM Date: 09/10/20 Filed Weights   General: Frail, chronically ill, alert, NAD. Heart: RRR Chest: No labored breathing or cough.  Lungs clear to auscultation anteriorly. Abdomen: Occasional normal bowel sounds, overall reduced.  No distention.  Still tender to light touch throughout. Extremities: Nonpitting lower extremity edema. Neuro/Psych: Appropriate.  Alert.  Not confused.  Moves all 4 limbs, no tremors.  Lab Results: Recent Labs    09/08/20 0133 09/09/20 0344 09/10/20 0330  WBC 12.5* 12.2* 16.9*  HGB 7.5* 7.3* 7.9*  HCT 23.8* 22.8* 25.0*  PLT 239 227 242   BMET Recent Labs    09/08/20 0133 09/09/20 0344 09/10/20 0330  NA 133* 136 133*  K 4.1 3.5 4.0  CL 99 101 98  CO2 26 28 25   GLUCOSE 118* 108* 118*  BUN 32* 22 35*  CREATININE 4.49* 3.17* 4.62*  CALCIUM 9.4 9.2 10.0   LFT Recent Labs    09/07/20 1647 09/08/20 0133 09/10/20 0330  PROT  --  5.3* 5.4*  ALBUMIN 2.0* 1.9* 2.1*  AST  --  24 38  ALT  --  14 49*  ALKPHOS  --  83 93  BILITOT  --  0.3 0.6   Studies/Results: No results found.  Scheduled Meds: . sodium chloride   Intravenous Once  . Chlorhexidine Gluconate Cloth  6 each Topical Q0600  . darbepoetin (ARANESP) injection - DIALYSIS  200 mcg Intravenous Q Sat-HD  . fidaxomicin   200 mg Oral BID  . niacin  250 mg Oral BID WC  . pantoprazole  40 mg Oral BID  . sodium chloride flush  10-40 mL Intracatheter Q12H   Continuous Infusions: . TPN ADULT (ION) 65 mL/hr at 09/09/20 1719  . TPN ADULT (ION)     PRN Meds:.acetaminophen **OR** acetaminophen, HYDROmorphone (DILAUDID) injection, hyoscyamine, lip balm, metoprolol tartrate, ondansetron (ZOFRAN) IV, promethazine, sodium chloride flush   ASSESMENT:    *  Diarrhea, abdominal pain.  C diff toxin and GDH +, completed treatement w standard vanc later accelerated to higher dose and IV Flagyl.  Now day 5 Dificid per ID rec.   PO vanc 2/18>2/28; IV flagyl 1/26>3/2; PO dificid 3/3>>.   Likely ischemic ulceration and fibrosis on 2/26 Flex sig.   2/26 [facility]:[id number], gastritis, retained gastric fluid. Path: reactive gastropathy. 09/05/2020 CTAPwcontrast:Mild perirectal inflammation/focal colitis, aortic atherosclerosis. WBC count had improved to 11.3 on 3/4 but climbing back to 16.9 today.    *   A fib.  Supratherapeutic INR at admission.  Chronic coumadin on hold  *   ESRD  *   Anemia of CKD.  Weekly Aranesp.  Hgb low of 6.8.  S/p 3 PRBCs 2/19 - 2/24.  Hgb 7.9.    *   Poor  po intake.  Severe malnutrition of chronic and acute illness per RD. On clears. TPN in place as of 3/1.   *   Minor rise in ALT to 49, O/w normal LFTs.  May be due to the TPN.     PLAN   *   Note plans for repeat CTAP tomorrow, ordered by surgeon Dr. Ninfa Linden..  *   Continue current supportive care.      Azucena Freed  09/10/2020, 11:26 AM Phone [phone]  ________________________________________________________________________  [facility] MD note:  I personally examined the patient, reviewed the data and agree with the assessment and plan described above.  Medically complex, frail man, in hospital now for about 3 weeks.  I am meeting him for the first time today.  Seems to be failing to thrive, sick colon is at least a large  contributor.  Agree with continued supportive care for now.  Will follow along.   Owens Loffler, MD [facility] Pager [phone]

## 2020-09-10 NOTE — Progress Notes (Signed)
PHARMACY - TOTAL PARENTERAL NUTRITION CONSULT NOTE   Indication: Prolonged ileus in the setting of ischemic colitis  Patient Measurements: Height: 5\' 8"  (172.7 cm) Weight:  (Unable to check bed wt) IBW/kg (Calculated) : 68.4   Body mass index is 17.6 kg/m. Usual Weight: ~63kg  Assessment:  [name] with hx rectal cancer s/p radiation/chemo, anteriro resection and ileostomy reversal, afib on warfarin with admit INR 8.8 now s/p reversal, ESRD on HD TTS, PUD with recent GIB, anemia of chronic disease, CVA, COPD, gout, and HTN admitted with melena, C.diff colitis, prolonged ileus, malnutrition with poor PO intake >11 days and hypoglycemia requiring D10 infusion. Patient lost ~15% of body weight in one month. Failed attempt to place NGT 2/26. PICC placement ok'd by nephrology. Pharmacy consulted for TPN.   Glucose / Insulin: A1C 5.5%. BG controlled, SSI d/c'd 3/3 Electrolytes: Phos 2.8, corrected Ca high ~11.4 (none in TPN), Na 133, Mg 2.1 Renal: ESRD on HD TTS - on schedule, last 3/5 Hepatic: LFTs / Tbili / TG wnl. Albumin 1.9, prealbumin up to 25.1 (3/7) <<  8.9 (3/2) Intake / Output; MIVF: continues with multiple loose BMs daily, has chronic diarrhea, minimal residual UOP - 400 cc/24h GI Imaging: 2/18 CT Abd- Wall thickening of the sigmoid colon and rectum, consistent with acute colitis/proctitis 2/24 CT Abd with IMA occlusion but celiac and SMA patent; no evidence of acute ischemia. Increased distension of the stomach, small bowel and colon since 08/24/2020. 2/25 DG abd- no NGT, Gastric and small bowel dilatation 3/2 CT abd pelvis - likely focal colitis, diverticulosis, new LLL infiltrate, new density within gallbladder (likely sludge or excretion of contrast) GI Surgeries / Procedures: 2/25 EGD- Mildly severe esophagitis with a small 2-3 mm ulcer with no bleeding; Retained fluid and small amounts of solid debris in stomach; attempted NGT multiple times but unable to advance  2/26 Flex sig  -severely ulcerated mucosa of the sigmoid colon/ rectum, possibly radiation changes vs ischemia vs infection  Central access: 3/1 TPN start date: 3/1   Nutritional Goals: (per RD recommendations 3/4) kCal: 1900-2100, Protein: 100-115g, Fluid: 1L + UOP per day Goal TPN rate is 70 mL/hr (provides 109g of protein and weekly avg of 1907 kcals per day)  Current Nutrition:  TPN CLD started 3/3, tolerating, no advancement per Surgery  Plan:  Adjust TPN to goal rate of 70 mL/hr at 1800 - meeting 100% of estimated weekly protein and kCal needs (50g SMOF lipids on MWF only due to Producer, television/film/video) Electrolytes in TPN: incr Na to 120 mEq/L, K 25 mEq/L, Mg 6 mEq/L, Ca removed, Phos at 8 mmol/L, Cl:Ac 1:1 Add standard MVI and trace elements to TPN, remove chromium with ESRD requiring RRT SSI d/c'd on 3/3 due to lack of utilization - monitor CBGs F/u TPN labs, Surgery plans, GOC (palliative family meeting planned 3/7)  Thank you for allowing pharmacy to be a part of this patients care.  Alycia Rossetti, PharmD, BCPS Clinical Pharmacist Clinical phone for 09/10/2020: [phone] 09/10/2020 8:15 AM   **Pharmacist phone directory can now be found on [email or web address] ([email or web address]).  Listed under [place].

## 2020-09-10 NOTE — Progress Notes (Signed)
Progress Note  9 Days Post-Op  Subjective: Patient reports stable abdominal pain. Still having diarrhea. Denies nausea this AM but reports it occurs intermittently.   Objective: Vital signs in last 24 hours: Temp:  [97.7 F (36.5 C)-98.9 F (37.2 C)] 97.7 F (36.5 C) (03/07 0335) Pulse Rate:  [99-107] 99 (03/07 0335) Resp:  [16-18] 17 (03/07 0335) BP: (143-159)/(73-80) 155/73 (03/07 0335) SpO2:  [100 %] 100 % (03/07 0335) Last BM Date: 09/09/20  Intake/Output from previous day: 03/06 0701 - 03/07 0700 In: 350 [P.O.:350] Out: 400 [Urine:400] Intake/Output this shift: No intake/output data recorded.  PE: General: pleasant, WD,cachecticmale who is laying inbedin NAD Heart: regular, rate, and rhythm.  Lungs: CTAB, no wheezes, rhonchi, or rales noted. Respiratory effort nonlabored Abd: soft,mild generalized ttp without peritonitis or guarding, ND, +BS, no masses, hernias, or organomegaly MS: all 4 extremities are symmetrical with no cyanosis, clubbing, or edema. Skin: warm and dry with no masses, lesions, or rashes Psych: A&Ox3 with an appropriate affect.   Lab Results:  Recent Labs    09/09/20 0344 09/10/20 0330  WBC 12.2* 16.9*  HGB 7.3* 7.9*  HCT 22.8* 25.0*  PLT 227 242   BMET Recent Labs    09/09/20 0344 09/10/20 0330  NA 136 133*  K 3.5 4.0  CL 101 98  CO2 28 25  GLUCOSE 108* 118*  BUN 22 35*  CREATININE 3.17* 4.62*  CALCIUM 9.2 10.0   PT/INR No results for input(s): LABPROT, INR in the last 72 hours. CMP     Component Value Date/Time   NA 133 (L) 09/10/2020 0330   NA 140 02/24/2017 0000   NA 138 02/20/2014 1432   K 4.0 09/10/2020 0330   K 3.5 02/20/2014 1432   CL 98 09/10/2020 0330   CL 101 12/09/2012 1048   CO2 25 09/10/2020 0330   CO2 30 (H) 02/20/2014 1432   GLUCOSE 118 (H) 09/10/2020 0330   GLUCOSE 93 02/20/2014 1432   GLUCOSE 96 12/09/2012 1048   BUN 35 (H) 09/10/2020 0330   BUN 50 (A) 08/10/2017 0000   BUN 36.0 (H)  02/20/2014 1432   CREATININE 4.62 (H) 09/10/2020 0330   CREATININE 2.4 (H) 02/20/2014 1432   CALCIUM 10.0 09/10/2020 0330   CALCIUM 10.4 02/20/2014 1432   PROT 5.4 (L) 09/10/2020 0330   PROT 7.2 02/20/2014 1432   ALBUMIN 2.1 (L) 09/10/2020 0330   ALBUMIN 3.8 02/20/2014 1432   AST 38 09/10/2020 0330   AST 22 02/20/2014 1432   ALT 49 (H) 09/10/2020 0330   ALT 12 02/20/2014 1432   ALKPHOS 93 09/10/2020 0330   ALKPHOS 86 02/20/2014 1432   BILITOT 0.6 09/10/2020 0330   BILITOT 0.32 02/20/2014 1432   GFRNONAA 13 (L) 09/10/2020 0330   GFRAA 8 (L) 04/08/2019 1702   Lipase     Component Value Date/Time   LIPASE 47 08/15/2020 0659       Studies/Results: No results found.  Anti-infectives: Anti-infectives (From admission, onward)   Start     Dose/Rate Route Frequency Ordered Stop   09/06/20 2200  fidaxomicin (DIFICID) tablet 200 mg        200 mg Oral 2 times daily 09/06/20 1530 09/16/20 2159   09/06/20 0930  vancomycin (VANCOCIN) 50 mg/mL oral solution 500 mg  Status:  Discontinued        500 mg Oral Every 6 hours 09/06/20 0830 09/06/20 1530   09/01/20 1600  metroNIDAZOLE (FLAGYL) IVPB 500 mg  Status:  Discontinued  500 mg 100 mL/hr over 60 Minutes Intravenous Every 12 hours 09/01/20 1315 09/05/20 1430   08/26/20 1000  vancomycin (VANCOCIN) 50 mg/mL oral solution 500 mg        500 mg Oral 4 times daily 08/26/20 0740 09/03/20 2159   08/24/20 2100  vancomycin (VANCOCIN) 50 mg/mL oral solution 125 mg  Status:  Discontinued        125 mg Oral 4 times daily 08/24/20 1717 08/26/20 0740       Assessment/Plan Hx of rectal CA s/p LAR and chemo/radiation  HTN Hx of CVA COPD ESRD on HDTTS A. Fibon coumadin - coumadin held Code status DNR/DNI - palliative following, planning for family meeting today at 5pm  C diff colitis,Ischemic colitis -Patient continues to have generalized pain but abdomen is soft without signs of peritonitis. No signs of pneumatosis or portal  venous gas on CT, although there is persistent thickening consistent with colitis. Surgical resection would be high risk given prior radiation to the pelvis and prior rectal dissection with coloanal anastomosis. For now recommend continued medical management of colitis. Ensure patient stays adequately hydrated and maintain normotension to maximize perfusion of the colon. - Given persistent pain and CT findings,abx were resumed 3/3 -Ok for sips of clear liquids, but do not advance diet any further. Continue TPN for nutritional support. - WBC up slightly this AM, abdominal exam stable - last CT was 3/2, will discuss with MD but could consider repeat CT A/P - We will continue to follow closely  FEN:CLD, IVF; TPN VTE: heparin with HD ID: PO vanc 2/18>2/28; IV flagyl 1/26>3/2; PO dificid 3/3>>   LOS: 17 days    [name] , [facility] 09/10/2020, 9:11 AM Please see Amion for pager number during day hours 7:00am-4:30pm

## 2020-09-10 NOTE — Progress Notes (Signed)
Palliative Medicine Inpatient Follow Up Note  Reason for consult:  Goals of Care  HPI:  Per intake H&P --> 75 year old gentleman withextensive medical issues and comorbidities includinghistory of chronic A. fib on Coumadin, peptic ulcer disease, ESRD on hemodialysis TTS schedule, history of rectal cancer status post low anterior resection and reversal of ileostomywho presented to the ER with abdominal cramping, nausea vomiting diarrhea and rectal bleeding. Patient was recently in the hospital 2/9-2/13 with upper GI bleeding and found to have bleeding duodenal ulcer treated with epinephrine injection and clipping. Discharged on Protonix twice daily. Patient also has chronic diarrhea. Admitted with rectal bleeding, C. difficile infection and diarrhea and anemia. Patient was hemodynamically stable in the ER.Admitted with C. diff, rectal bleeding. Followed by GI.  Palliative care was consulted in the setting of difficult to treat conditions to discuss goals of care.  Today's Discussion (09/10/2020):  *Please note that this is a verbal dictation therefore any spelling or grammatical errors are due to the "[place]" system interpretation.  I met with Pascal this afternoon. He was in good spirits. He shares with me that he was able to ambulate with physical therapy this morning. He also tells me that his abdominal pain in "a bit better". We reviewed that his white blood cell count has risen again discussed his ongoing antibiotic regiment for his colitis. Cyan tells me that he is now able to sip clear liquids and there are plans for a repeat CT scan tomorrow.    I asked him if he would be willing to get up with me this afternoon which he was in agreement with. Xachary as able to inde[pendely get up and with the [place] mobilize to the chair. He complains of some lower back pain which was remedied with tylenol.  We discussed the plan for a meeting with his wife, [name] and sons, [name] and Kabeer  this evening.  __________________________________________________________________ Family Meeting at [place]  Providers: Dr. [place] Tacey Ruiz, [facility]  Participants: Burnett Kanaris and Bonnita Hollow.  Dr. [place] and myself met with Cleda Daub., Saintclair Halsted., and Shaun this evening. Dr. [place] explained that Aitan is being treated with another course of antibiotics for his C.Diff. He shares that infectious disease and surgery are involved in Short Sr.'s care.   We discussed that as it sits presently options such as surgery are not likely given patients profound muscular debility and impaired nutritional state. We reviewed the concern that [facility]. Would have a hard time surviving the surgery given his multi-system failure and that even if he did the ability of his wound healing properly would be unlikely. We discussed that surgery would be very complicated and decrease the already compromised quality of life that Arlene has.   Dr. [place] explained that we are getting closer to a fork in the road. He explains that we can continue along with aggressive measures as we have been doing though there is great concern that Tamim may not improve tremendously beyond this point. He discussed that Loghan may continue to not be able to broaden diet and be on TPN indefinitely which is not by any measure feasible for the long term.   He then shares that his family could consider comfort based care and allowing him to go home with hospice. We reviewed that this would mean stopping HD and that his time would be short though he would be able to spend it with his family and even be able to eat the foods  he enjoys with symptoms being comprehensible managed.   We reviewed the differences between home hospice and inpatient hospice. Reviewed that it is important to consider at this point what [facility]. Would most want at this point. We agreed to continue conservative treatment measures until the end of the week and then make a  decision. His sons were able to ask Dr. [place] questions and find answer to their appeasement.  Objective Assessment: Vital Signs Vitals:   09/09/20 2110 09/10/20 0335  BP: (!) 159/79 (!) 155/73  Pulse: (!) 107 99  Resp: 16 17  Temp: 98.9 F (37.2 C) 97.7 F (36.5 C)  SpO2: 100% 100%    Intake/Output Summary (Last 24 hours) at 09/10/2020 1328 Last data filed at 09/10/2020 1203 Gross per 24 hour  Intake 810 ml  Output 400 ml  Net 410 ml   Last Weight  Most recent update: 09/04/2020 10:36 AM   Weight  52.5 kg (115 lb 11.9 oz)           Gen:  Older AA male, chronically ill appearing HEENT: Dry mucous membranes CV: Irregular rate and rhythm  PULM:  2LPM [place] ABD: soft/nontender  EXT: (+) muscle wasting Neuro: Somnolent  SUMMARY OF RECOMMENDATIONS   DNAR/DNI  MOST/DNR in Vyn[place]  Continue current course of care for now, time trial of treatments to see if additional improvements can be make  Patients family will decide by the end of the week if we should continue with aggressive measures or pursue comfort based care  Ongoing PMT support, pls call [phone] if additional questions should arise  Time Spent:  60 Greater than 50% of the time was spent in counseling and coordination of care ______________________________________________________________________________________ [place] Team Team Cell Phone: [phone] Please utilize secure chat with additional questions, if there is no response within 30 minutes please call the above phone number  Palliative Medicine Team providers are available by phone from 7am to 7pm daily and can be reached through the team cell phone.  Should this patient require assistance outside of these hours, please call the patient's attending physician.

## 2020-09-10 NOTE — Progress Notes (Signed)
Physical Therapy Treatment Patient Details Name: Jason SJOGREN Sr. MRN: [id number] DOB: 05-04-1946 Today's Date: 09/10/2020    History of Present Illness Pt is a 75 y.o. male recently admitted 08/15/20-08/19/20 with upper GIB and duodenal ulcer, now readmitted 08/24/20 with C. difficile collitis, lower GIB. Transfer 4E to 6N on 3/4. PMH includes afib on Coumadin, peptic ulcer disease, rectal CA, ESRD (HD TTS), CVA, gout, COPD, s/p partial lung lobectomy (2017).    PT Comments    Pt received in bed, agreeable to participation in therapy and requesting to walk today. He required min assist bed mobility, min assist transfers, and min guard assist ambulation 15' x 2 with RW. Incontinence of bowel/loss stool throughout ambulation/mobility. Pt reports abdominal pain is a little better today. After 2nd ambulation trial, pt with c/o nausea requiring return to supine in bed.    Follow Up Recommendations  SNF     Equipment Recommendations  3in1 (PT)    Recommendations for Other Services       Precautions / Restrictions Precautions Precautions: Fall Precaution Comments: WBAT in RLE in CAM boot since last admission Required Braces or Orthoses: Other Brace Other Brace: CAM walker boot Restrictions RLE Weight Bearing: Weight bearing as tolerated Other Position/Activity Restrictions: WBAT in CAM boot    Mobility  Bed Mobility Overal bed mobility: Needs Assistance Bed Mobility: Supine to Sit;Sit to Supine     Supine to sit: HOB elevated;Min assist Sit to supine: HOB elevated;Min assist   General bed mobility comments: +rail, cues for sequencing, assist to elevate trunk and assist with BLE back to bed    Transfers Overall transfer level: Needs assistance Equipment used: Rolling walker (2 wheeled) Transfers: Sit to/from Stand Sit to Stand: Min assist         General transfer comment: assist to power up  Ambulation/Gait Ambulation/Gait assistance: Min guard Gait Distance (Feet): 15  Feet (x 2) Assistive device: Rolling walker (2 wheeled) Gait Pattern/deviations: Step-through pattern;Decreased stride length Gait velocity: decreased Gait velocity interpretation: <1.31 ft/sec, indicative of household ambulator General Gait Details: min guard for safety, distance limited by fatigue weakness, incontinence of bowel/loss stool throughout gait/mobility   Stairs             Wheelchair Mobility    Modified Rankin (Stroke Patients Only)       Balance Overall balance assessment: Needs assistance Sitting-balance support: Feet supported;No upper extremity supported Sitting balance-Leahy Scale: Good     Standing balance support: Bilateral upper extremity supported;During functional activity Standing balance-Leahy Scale: Poor Standing balance comment: reliant on BUE support                            Cognition Arousal/Alertness: Awake/alert Behavior During Therapy: Flat affect Overall Cognitive Status: Impaired/Different from baseline Area of Impairment: Safety/judgement;Problem solving;Awareness                         Safety/Judgement: Decreased awareness of deficits Awareness: Emergent Problem Solving: Slow processing;Difficulty sequencing;Requires verbal cues        Exercises      General Comments General comments (skin integrity, edema, etc.): Pt with c/o nausea at end of session requiring return to supine in bed.      Pertinent Vitals/Pain Pain Assessment: Faces Faces Pain Scale: Hurts even more Pain Location: abdomen Pain Descriptors / Indicators: Grimacing;Discomfort;Constant Pain Intervention(s): Limited activity within patient's tolerance;Repositioned;Monitored during session;Patient requesting pain meds-RN notified  Home Living                      Prior Function            PT Goals (current goals can now be found in the care plan section) Acute Rehab PT Goals Patient Stated Goal: to walk Progress  towards PT goals: Progressing toward goals    Frequency    Min 2X/week      PT Plan Current plan remains appropriate    Co-evaluation              AM-PAC PT "6 Clicks" Mobility   Outcome Measure  Help needed turning from your back to your side while in a flat bed without using bedrails?: None Help needed moving from lying on your back to sitting on the side of a flat bed without using bedrails?: A Little Help needed moving to and from a bed to a chair (including a wheelchair)?: A Little Help needed standing up from a chair using your arms (e.g., wheelchair or bedside chair)?: A Little Help needed to walk in hospital room?: A Little Help needed climbing 3-5 steps with a railing? : A Lot 6 Click Score: 18    End of Session Equipment Utilized During Treatment: Gait belt Activity Tolerance: Patient tolerated treatment well Patient left: in bed;with call bell/phone within reach Nurse Communication: Patient requests pain meds PT Visit Diagnosis: Unsteadiness on feet (R26.81);Difficulty in walking, not elsewhere classified (R26.2);Muscle weakness (generalized) (M62.81)     Time: 4481-8563 PT Time Calculation (min) (ACUTE ONLY): 26 min  Charges:  $Gait Training: 23-37 mins                     Lorrin Goodell, [place]  Office # [phone] Pager [phone]    Lorriane Shire 09/10/2020, 9:24 AM

## 2020-09-11 ENCOUNTER — Inpatient Hospital Stay (HOSPITAL_COMMUNITY): Payer: Medicare Other

## 2020-09-11 DIAGNOSIS — A0472 Enterocolitis due to Clostridium difficile, not specified as recurrent: Secondary | ICD-10-CM | POA: Diagnosis not present

## 2020-09-11 DIAGNOSIS — K2970 Gastritis, unspecified, without bleeding: Secondary | ICD-10-CM | POA: Diagnosis not present

## 2020-09-11 DIAGNOSIS — R140 Abdominal distension (gaseous): Secondary | ICD-10-CM | POA: Diagnosis not present

## 2020-09-11 DIAGNOSIS — K633 Ulcer of intestine: Secondary | ICD-10-CM | POA: Diagnosis not present

## 2020-09-11 LAB — GLUCOSE, CAPILLARY
Glucose-Capillary: 108 mg/dL — ABNORMAL HIGH (ref 70–99)
Glucose-Capillary: 114 mg/dL — ABNORMAL HIGH (ref 70–99)
Glucose-Capillary: 114 mg/dL — ABNORMAL HIGH (ref 70–99)
Glucose-Capillary: 116 mg/dL — ABNORMAL HIGH (ref 70–99)
Glucose-Capillary: 126 mg/dL — ABNORMAL HIGH (ref 70–99)
Glucose-Capillary: 99 mg/dL (ref 70–99)

## 2020-09-11 LAB — CBC WITH DIFFERENTIAL/PLATELET
Abs Immature Granulocytes: 0.31 10*3/uL — ABNORMAL HIGH (ref 0.00–0.07)
Basophils Absolute: 0 10*3/uL (ref 0.0–0.1)
Basophils Relative: 0 %
Eosinophils Absolute: 0.1 10*3/uL (ref 0.0–0.5)
Eosinophils Relative: 1 %
HCT: 22.8 % — ABNORMAL LOW (ref 39.0–52.0)
Hemoglobin: 7.4 g/dL — ABNORMAL LOW (ref 13.0–17.0)
Immature Granulocytes: 2 %
Lymphocytes Relative: 4 %
Lymphs Abs: 0.6 10*3/uL — ABNORMAL LOW (ref 0.7–4.0)
MCH: 29.1 pg (ref 26.0–34.0)
MCHC: 32.5 g/dL (ref 30.0–36.0)
MCV: 89.8 fL (ref 80.0–100.0)
Monocytes Absolute: 1.1 10*3/uL — ABNORMAL HIGH (ref 0.1–1.0)
Monocytes Relative: 7 %
Neutro Abs: 13.4 10*3/uL — ABNORMAL HIGH (ref 1.7–7.7)
Neutrophils Relative %: 86 %
Platelets: 263 10*3/uL (ref 150–400)
RBC: 2.54 MIL/uL — ABNORMAL LOW (ref 4.22–5.81)
RDW: 23.8 % — ABNORMAL HIGH (ref 11.5–15.5)
WBC: 15.5 10*3/uL — ABNORMAL HIGH (ref 4.0–10.5)
nRBC: 0.1 % (ref 0.0–0.2)

## 2020-09-11 LAB — BASIC METABOLIC PANEL
Anion gap: 9 (ref 5–15)
BUN: 49 mg/dL — ABNORMAL HIGH (ref 8–23)
CO2: 24 mmol/L (ref 22–32)
Calcium: 9.5 mg/dL (ref 8.9–10.3)
Chloride: 100 mmol/L (ref 98–111)
Creatinine, Ser: 5.38 mg/dL — ABNORMAL HIGH (ref 0.61–1.24)
GFR, Estimated: 11 mL/min — ABNORMAL LOW (ref >60)
Glucose, Bld: 115 mg/dL — ABNORMAL HIGH (ref 70–99)
Potassium: 4.3 mmol/L (ref 3.5–5.1)
Sodium: 133 mmol/L — ABNORMAL LOW (ref 135–145)

## 2020-09-11 MED ORDER — IOHEXOL 300 MG/ML  SOLN
100.0000 mL | Freq: Once | INTRAMUSCULAR | Status: AC | PRN
  Administered 2020-09-11: 100 mL via INTRAVENOUS

## 2020-09-11 MED ORDER — TRACE MINERALS CU-MN-SE-ZN 300-55-60-3000 MCG/ML IV SOLN
INTRAVENOUS | Status: AC
  Filled 2020-09-11: qty 728

## 2020-09-11 MED ORDER — HYDROMORPHONE HCL 1 MG/ML IJ SOLN
INTRAMUSCULAR | Status: AC
  Filled 2020-09-11: qty 0.5

## 2020-09-11 NOTE — Procedures (Signed)
   I was present at this dialysis session, have reviewed the session itself and made  appropriate changes Kelly Splinter MD [place] pager [phone]   09/11/2020, 09:00 am

## 2020-09-11 NOTE — Progress Notes (Signed)
Patient to HD this morning, returned to unit at 1100; tolerated well per report. Pain managed with prn dilaudid. Pt currently to CT scan.

## 2020-09-11 NOTE — Progress Notes (Signed)
PROGRESS NOTE    Jason PAPADOPOULOS Sr.  MEQ:[id number] DOB: [birth year] DOA: 08/24/2020 PCP: [name], MD    Brief Narrative:  Jason Cotta Sr. is a 75 year old gentleman with past medical history significant for chronic atrial fibrillation on Coumadin, peptic ulcer disease, ESRD on HD TTS, history of rectal cancer status post low anterior resection and reversal of ileostomy with chemotherapy/radiation who presented to the ED with abdominal pain/cramping, nausea/vomiting/diarrhea and rectal bleeding. Patient with follow-up with his GI physician due to recent hospitalization from GI bleed due to duodenal ulcer, patient had repeat labs performed which was notably positive for C. difficile colitis. Patient was started on medication for his C. difficile infection outpatient, but he noticed a large amount of blood in his stool which was concerning which prompted further evaluation in the ED.  In the ED, patient was found to have an elevated INR, he was given oral vitamin K and started on Protonix. [facility] was consulted. Hospital service consulted for further evaluation and management of GI bleed with supratherapeutic INR in the setting of C. difficile colitis.   Assessment & Plan:   Active Problems:   Hematochezia   GIB (gastrointestinal bleeding)   C. difficile colitis   Generalized abdominal pain   Non-intractable vomiting   Abdominal distension (gaseous)   Gastritis and gastroduodenitis   Noninfectious gastroenteritis   Colon ulcer   Intestinal occlusion (HCC)   Protein-calorie malnutrition, severe   Ischemic colitis ([place])   C. difficile colitis CT angiogram abdomen/pelvis 2/24 suspicious for ileus, no obstruction. Flexible sigmoidoscopy 09/01/2020 with severely ulcerated mucosa at the sigmoid colon/rectum, possibly radiation changes versus ischemia versus infection. --ID/general surgery/[facility] following --WBC 17.4>30.9>35.7>>>12.5>12.2>16.8>15.5 --Difcid 200mg  PO BID x 10  days (end 09/15/2020) --CBC daily --General plans repeat CT abdomen/pelvis today  Acute on chronic GI bleeding Acute blood loss anemia, POA Supratherapeutic INR, POA Patient reports large bloody bowel movement prior to ED presentation. INR notably elevated at 8.8 on admission. Received oral vitamin K for reversal. Recent hospitalization with finding of duodenal ulcer on EGD. Discharged on Protonix twice daily. EGD on 09/01/2020 with no evidence of active bleeding. Colonoscopy 2/26 with findings consistent with severe radiation changes, proctitis, ischemic vs inflammatory colitis, strictures. --[facility] following, appreciate assistance. --Hgb 8.9>6.8>>8.6>7.0>9.6>8.9>9.7>9.1>7.9>7.4>7.2>7.5>7.3>7.9>7.4 --s/p 2u pRBC 2/19 and 1 u pRBC 2/24 --Aranesp 26mcg every Sat w/ HD --Continue to monitor hemoglobin daily, transfuse for Hgb <7.0 or active bleeding --CBC in am  Nausea/vomiting Ileus Hx rectal cancer s/p LAR, XRT, chemotherapy No gastric outlet obstruction noted on endoscopy. Colonoscopy notable for anal stenosis with rectal stenosis and ulceration from previous surgery/radiation. CT angiogram abdomen/pelvis with occlusion IMA but patent celiac and SMA with collateral filling. --General surgery following, appreciate assistance --CT abdomen/pelvis today with mild increase perirectal inflammatory change consistent with focal colitis, colon decompressed, stomach decompressed, no small bowel dilation --CLD; on TPN; no further advancement of diet per general surgery --plan repeat CT abdomen/pelvis as above today  Chronic atrial fibrillation on Coumadin Supratherapeutic INR INR 8.8 admission, reversed with oral vitamin K. --Continue to hold Coumadin --Continue to monitor on telemetry  ESRD on HD TTS --Nephrology following, appreciate assistance --Continue HD inpatient per nephrology  Hypoglycemic episodes Etiology likely secondary to prolonged n.p.o. status.  Now on TPN --continue to  monitor glucose closely  Adult failure to thrive Severe protein calorie malnutrition 2/2 prolonged illness and ileus Body mass index is 17.53 kg/m.  Albumin 1.9.  Oral intake limited to ability to tolerate given  nausea/vomiting and C. difficile colitis with ileus.  --Continues clear liquid diet without further advancement per general surgery --Continue TPN  Weakness/debility/deconditioning: Patient with prolonged hospitalization due to ileus and severe C. difficile colitis.  Prior to hospitalization, patient was fairly independent in regards to his ADLs.  His decline during this hospitalization has also been complicated by his prolonged n.p.o. status. --Continue PT/OT efforts while inpatient with plans of SNF discharge when medically stable  Goals of care: --Palliative care following, appreciate assistance.  Discussed with patient and spouse extensively, very poor surgical candidate if needed and possibly would not survive surgical intervention.  Discussed with spouse consideration of comfort measures/palliative if patient does not respond with conservative measures.  Family meeting with sons [name] and Habib Kise and Palliative care NP on 09/10/2020.  Shaun and Stanford Breed. seemed to understand the severity of their fathers illness and all questions regarding the care and prognosis answered.  Discussed with both of them that if their father does not make any considerable improvements by the end of the week, need to consider transitioning of care to a more palliative approach in which their father could return home under home hospice.     DVT prophylaxis: SCDs   Code Status: DNR Family Communication: Updated patient's brother Rosanna Randy this morning   Disposition Plan:  Level of care: Telemetry Medical Status is: Inpatient  Remains inpatient appropriate because:Ongoing diagnostic testing needed not appropriate for outpatient work up, Unsafe d/c plan, IV treatments appropriate due to intensity of  illness or inability to take PO and Inpatient level of care appropriate due to severity of illness   Dispo:  Patient From: Home  Planned Disposition: To be determined  Medically stable for discharge: No     Consultants:   [place] surgery  Nephrology  Palliative care  Infectious disease   Procedures:   EGD  Colonoscopy  Antimicrobials:   Oral vancomycin 2/18 - 2/28; 3/3 - 3/3  Metronidazole 2/26 - 3/2  Dificid 3/3>>    Subjective: Patient seen and examined at bedside, currently in hemodialysis unit receiving HD.  Continues with abdominal discomfort, not significantly changed from yesterday.  Pending repeat CT abdomen/pelvis this morning following dialysis.  Family meeting yesterday with patient and 2 sons at bedside with palliative care.  Unfortunately, patient not making much improvement in terms of his bowel function and pain with poor prognosis.  Patient with no other complaints or concerns at this time.  Denies headache, no chest pain, no palpitation, no shortness of breath, no fever/chills/night sweats.  No acute events overnight per nursing staff.   Objective: Vitals:   09/11/20 0930 09/11/20 1000 09/11/20 1030 09/11/20 1100  BP: (!) 153/69 (!) 150/70 (!) 163/79 (!) 145/75  Pulse: 100 97 98 92  Resp:      Temp:      TempSrc:      SpO2:      Height:        Intake/Output Summary (Last 24 hours) at 09/11/2020 1130 Last data filed at 09/11/2020 0256 Gross per 24 hour  Intake 580 ml  Output 250 ml  Net 330 ml   Filed Weights    Examination:  General exam: Appears calm and comfortable, chronically ill/cachectic/frail in appearance Respiratory system: Clear to auscultation. Respiratory effort normal. Oxygenating well on room air Cardiovascular system: S1 & S2 heard, RRR. No JVD, murmurs, rubs, gallops or clicks. No pedal edema. Gastrointestinal system: Abdomen is distended, with mild/mod diffuse tenderness to palpation. No organomegaly or  masses felt. Normal bowel sounds heard. Central nervous system: Alert and oriented. No focal neurological deficits. Extremities: Symmetric 5 x 5 power. Skin: No rashes, lesions or ulcers Psychiatry: Judgement and insight appear poor. Mood & affect appropriate.     Data Reviewed: I have personally reviewed following labs and imaging studies  CBC: Recent Labs  Lab 09/07/20 0046 09/07/20 1647 09/08/20 0133 09/09/20 0344 09/10/20 0330 09/11/20 0845  WBC 13.1* 11.3* 12.5* 12.2* 16.9* 15.5*  NEUTROABS 11.7*  --  9.1* 9.0* 13.7* 13.4*  HGB 7.2* 7.6* 7.5* 7.3* 7.9* 7.4*  HCT 22.1* 24.6* 23.8* 22.8* 25.0* 22.8*  MCV 87.0 87.9 87.5 88.7 89.6 89.8  PLT 234 245 239 227 242 983   Basic Metabolic Panel: Recent Labs  Lab 09/06/20 0524 09/07/20 0046 09/07/20 1647 09/08/20 0133 09/09/20 0344 09/10/20 0330 09/11/20 0432 09/11/20 0719  NA 135 135 133* 133* 136 133* QUESTIONABLE RESULTS, RECOMMEND RECOLLECT TO VERIFY 133*  K 3.2* 3.1* 4.0 4.1 3.5 4.0 QUESTIONABLE RESULTS, RECOMMEND RECOLLECT TO VERIFY 4.3  CL 102 101 99 99 101 98 QUESTIONABLE RESULTS, RECOMMEND RECOLLECT TO VERIFY 100  CO2 24 27 25 26 28 25  QUESTIONABLE RESULTS, RECOMMEND RECOLLECT TO VERIFY 24  GLUCOSE 117* 106* 120* 118* 108* 118* QUESTIONABLE RESULTS, RECOMMEND RECOLLECT TO VERIFY 115*  BUN 31* 16 30* 32* 22 35* QUESTIONABLE RESULTS, RECOMMEND RECOLLECT TO VERIFY 49*  CREATININE 5.61* 3.29* 4.04* 4.49* 3.17* 4.62* QUESTIONABLE RESULTS, RECOMMEND RECOLLECT TO VERIFY 5.38*  CALCIUM 8.8* 8.8* 9.1 9.4 9.2 10.0 QUESTIONABLE RESULTS, RECOMMEND RECOLLECT TO VERIFY 9.5  MG 1.8 2.2  --  2.2 2.1 2.1  --   --   PHOS 3.2 1.7* 3.1 2.7 2.1* 2.8  --   --    GFR: Estimated Creatinine Clearance: 9.1 mL/min (A) (by C-G formula based on SCr of 5.38 mg/dL (H)). Liver Function Tests: Recent Labs  Lab 09/05/20 0508 09/06/20 0524 09/07/20 0046 09/07/20 1647 09/08/20 0133 09/10/20 0330  AST 12* 10* 13*  --  24 38  ALT 9 9 9   --   14 49*  ALKPHOS 84 73 73  --  83 93  BILITOT 0.3 0.1* 0.4  --  0.3 0.6  PROT 4.9* 4.7* 4.9*  --  5.3* 5.4*  ALBUMIN 1.9* 1.8* 1.8* 2.0* 1.9* 2.1*   No results for input(s): LIPASE, AMYLASE in the last 168 hours. No results for input(s): AMMONIA in the last 168 hours. Coagulation Profile: No results for input(s): INR, PROTIME in the last 168 hours. Cardiac Enzymes: No results for input(s): CKTOTAL, CKMB, CKMBINDEX, TROPONINI in the last 168 hours. BNP (last 3 results) No results for input(s): PROBNP in the last 8760 hours. HbA1C: No results for input(s): HGBA1C in the last 72 hours. CBG: Recent Labs  Lab 09/10/20 1158 09/10/20 1735 09/10/20 2013 09/11/20 0022 09/11/20 0352  GLUCAP 115* 108* 106* 116* 114*   Lipid Profile: Recent Labs    09/10/20 0330  TRIG 54   Thyroid Function Tests: No results for input(s): TSH, T4TOTAL, FREET4, T3FREE, THYROIDAB in the last 72 hours. Anemia Panel: No results for input(s): VITAMINB12, FOLATE, FERRITIN, TIBC, IRON, RETICCTPCT in the last 72 hours. Sepsis Labs: No results for input(s): PROCALCITON, LATICACIDVEN in the last 168 hours.  No results found for this or any previous visit (from the past 240 hour(s)).       Radiology Studies: No results found.      Scheduled Meds: . sodium chloride   Intravenous Once  . Chlorhexidine Gluconate Cloth  6 each Topical Daily  . darbepoetin (ARANESP) injection - DIALYSIS  200 mcg Intravenous Q Sat-HD  . fidaxomicin  200 mg Oral BID  . niacin  250 mg Oral BID WC  . pantoprazole  40 mg Oral BID  . sodium chloride flush  10-40 mL Intracatheter Q12H   Continuous Infusions: . TPN ADULT (ION) 70 mL/hr at 09/10/20 1818  . TPN ADULT (ION)       LOS: 18 days    Time spent: 37 minutes spent on chart review, discussion with nursing staff, consultants, updating family and interview/physical exam; more than 50% of that time was spent in counseling and/or coordination of care.    Ihsan Nomura J  [place] DO [facility] Available via Epic secure chat 7am-7pm After these hours, please refer to coverage provider listed on [email or web address] 09/11/2020, 11:30 AM

## 2020-09-11 NOTE — Progress Notes (Signed)
OT Cancellation Note  Patient Details Name: Jason [name] Sr. MRN: [id number] DOB: Nov 09, 1945   Cancelled Treatment:    Reason Eval/Treat Not Completed: Other (comment) pt pleasant, but declining participation this date reporting he had HD this AM and is now leaving the floor shortly for CT. Nursing confirmed. Requesting OT to return tomorrow as he would like to get up and move, but "just not today"  Zoey Bidwell OTR/L acute rehab services Office: [phone]   09/11/2020, 3:03 PM

## 2020-09-11 NOTE — Progress Notes (Signed)
Progress Note  10 Days Post-Op  Subjective: Patient seen in HD. Reports abdominal pain and nausea are stable. Had ~6 BM in last 24 hrs.   Objective: Vital signs in last 24 hours: Temp:  [98.1 F (36.7 C)-98.6 F (37 C)] 98.6 F (37 C) (03/08 0740) Pulse Rate:  [93-108] 98 (03/08 1030) Resp:  [16-20] 18 (03/08 0758) BP: (144-174)/(68-87) 163/79 (03/08 1030) SpO2:  [97 %-100 %] 97 % (03/08 0740) Last BM Date: 09/10/20  Intake/Output from previous day: 03/07 0701 - 03/08 0700 In: 700 [P.O.:700] Out: 250 [Urine:250] Intake/Output this shift: No intake/output data recorded.  PE: General: pleasant, WD,cachecticmale who is laying inbedin NAD Heart: regular, rate, and rhythm.  Lungs: CTAB, no wheezes, rhonchi, or rales noted. Respiratory effort nonlabored Abd: soft,mild generalized ttp without peritonitis or guarding, ND, +BS, no masses, hernias, or organomegaly MS: all 4 extremities are symmetrical with no cyanosis, clubbing, or edema. Skin: warm and dry with no masses, lesions, or rashes Psych: A&Ox3 with an appropriate affect.   Lab Results:  Recent Labs    09/09/20 0344 09/10/20 0330  WBC 12.2* 16.9*  HGB 7.3* 7.9*  HCT 22.8* 25.0*  PLT 227 242   BMET Recent Labs    09/11/20 0432 09/11/20 0719  NA QUESTIONABLE RESULTS, RECOMMEND RECOLLECT TO VERIFY 133*  K QUESTIONABLE RESULTS, RECOMMEND RECOLLECT TO VERIFY 4.3  CL QUESTIONABLE RESULTS, RECOMMEND RECOLLECT TO VERIFY 100  CO2 QUESTIONABLE RESULTS, RECOMMEND RECOLLECT TO VERIFY 24  GLUCOSE QUESTIONABLE RESULTS, RECOMMEND RECOLLECT TO VERIFY 115*  BUN QUESTIONABLE RESULTS, RECOMMEND RECOLLECT TO VERIFY 49*  CREATININE QUESTIONABLE RESULTS, RECOMMEND RECOLLECT TO VERIFY 5.38*  CALCIUM QUESTIONABLE RESULTS, RECOMMEND RECOLLECT TO VERIFY 9.5   PT/INR No results for input(s): LABPROT, INR in the last 72 hours. CMP     Component Value Date/Time   NA 133 (L) 09/11/2020 0719   NA 140 02/24/2017 0000   NA  138 02/20/2014 1432   K 4.3 09/11/2020 0719   K 3.5 02/20/2014 1432   CL 100 09/11/2020 0719   CL 101 12/09/2012 1048   CO2 24 09/11/2020 0719   CO2 30 (H) 02/20/2014 1432   GLUCOSE 115 (H) 09/11/2020 0719   GLUCOSE 93 02/20/2014 1432   GLUCOSE 96 12/09/2012 1048   BUN 49 (H) 09/11/2020 0719   BUN 50 (A) 08/10/2017 0000   BUN 36.0 (H) 02/20/2014 1432   CREATININE 5.38 (H) 09/11/2020 0719   CREATININE 2.4 (H) 02/20/2014 1432   CALCIUM 9.5 09/11/2020 0719   CALCIUM 10.4 02/20/2014 1432   PROT 5.4 (L) 09/10/2020 0330   PROT 7.2 02/20/2014 1432   ALBUMIN 2.1 (L) 09/10/2020 0330   ALBUMIN 3.8 02/20/2014 1432   AST 38 09/10/2020 0330   AST 22 02/20/2014 1432   ALT 49 (H) 09/10/2020 0330   ALT 12 02/20/2014 1432   ALKPHOS 93 09/10/2020 0330   ALKPHOS 86 02/20/2014 1432   BILITOT 0.6 09/10/2020 0330   BILITOT 0.32 02/20/2014 1432   GFRNONAA 11 (L) 09/11/2020 0719   GFRAA 8 (L) 04/08/2019 1702   Lipase     Component Value Date/Time   LIPASE 47 08/15/2020 0659       Studies/Results: No results found.  Anti-infectives: Anti-infectives (From admission, onward)   Start     Dose/Rate Route Frequency Ordered Stop   09/06/20 2200  fidaxomicin (DIFICID) tablet 200 mg        200 mg Oral 2 times daily 09/06/20 1530 09/16/20 2159   09/06/20 0930  vancomycin (  VANCOCIN) 50 mg/mL oral solution 500 mg  Status:  Discontinued        500 mg Oral Every 6 hours 09/06/20 0830 09/06/20 1530   09/01/20 1600  metroNIDAZOLE (FLAGYL) IVPB 500 mg  Status:  Discontinued        500 mg 100 mL/hr over 60 Minutes Intravenous Every 12 hours 09/01/20 1315 09/05/20 1430   08/26/20 1000  vancomycin (VANCOCIN) 50 mg/mL oral solution 500 mg        500 mg Oral 4 times daily 08/26/20 0740 09/03/20 2159   08/24/20 2100  vancomycin (VANCOCIN) 50 mg/mL oral solution 125 mg  Status:  Discontinued        125 mg Oral 4 times daily 08/24/20 1717 08/26/20 0740       Assessment/Plan Hx of rectal CA s/p LAR and  chemo/radiation  HTN Hx of CVA COPD ESRD on HDTTS A. Fibon coumadin - coumadin held Code status DNR/DNI - palliative following, plan is to continue current treatments for now and family will decide by the end of this week if they would like to continue to pursue aggressive care  C diff colitis,Ischemic colitis -Patient continues to have generalized pain but abdomen is soft without signs of peritonitis. No signs of pneumatosis or portal venous gas on CT, although there is persistent thickening consistent with colitis. Surgical resection would be high risk given prior radiation to the pelvis and prior rectal dissection with coloanal anastomosis. For now recommend continued medical management of colitis. Ensure patient stays adequately hydrated and maintain normotension to maximize perfusion of the colon. - Given persistent pain and CT findings,abx were resumed 3/3 -Ok for sips of clear liquids, but do not advance diet any further. Continue TPN for nutritional support. - CBC pending, abdominal exam stable but not really improved - repeat CT A/P today  - We will continue to follow closely  FEN:CLD, IVF; TPN VTE: heparin with HD ID: PO vanc 2/18>2/28; IV flagyl 1/26>3/2; PO dificid 3/3>>  LOS: 18 days    [name] , [facility] Surgery 09/11/2020, 10:38 AM Please see Amion for pager number during day hours 7:00am-4:30pm

## 2020-09-11 NOTE — Progress Notes (Signed)
VAST consulted to redraw BMP as this am's results were questionable. Pt taken to dialysis before labs could be obtained by VAST RN. Phoned dialysis and requested BMP be collected with access and sent to lab.

## 2020-09-11 NOTE — Progress Notes (Signed)
Subjective: pt seen on HD, no c/o's.   Objective Vital signs in last 24 hours: Vitals:   09/11/20 1030 09/11/20 1100 09/11/20 1120 09/11/20 1206  BP: (!) 163/79 (!) 145/75 (!) 156/72 (!) 148/65  Pulse: 98 92 95 94  Resp:   18 18  Temp:   99.3 F (37.4 C) 98.6 F (37 C)  TempSrc:   Oral Oral  SpO2:   97% 100%  Height:       Weight change:   Physical Exam General: elderly, chronically ill appearing male,NAD Heart:tachycardia, RR, no mrg Lungs CTAB, nonlabored breathing Abdomen: BS positive soft, ND, mildly tender throughout Extremities:no LE edema Dialysis Access: LU AVF +bruit  OP HD: [place] TTS  4h 450/500 58kg 3K/2Ca P2 L AVF Heparin none  Mircera 50 (to start 2/22) Venofer 50  Hectorol 7   Problem/Plan: 1. C. Diff colitis - on po Vanc/Florastor/ dificid. Having persistent abd pain and diarrhea. Flex sig 2/26 showed severely ulcerated mucosa of the sigmoid colon/ rectum, possibly radiation changes vs ischemia vs infection. Per primary/GI/CCS.   2. GI bleed/ ABL anemia: w/ supra therapeutic INR on admit. SP EGD2/9with duodenal ulcer, treated w/ clip+ injection. On Protonix. s/p 1unit pRBC 2/24. dec in hgb this week.  3. Nutrition - is now getting IV TPN   4. ESRD - HD TTS.  Next HD 3/08 Tuesday.No heparin . 5. AFib- on coumadin. Supratherapeutic INR on admission.Reversed  6. HTN/Volume -a.m. BP 131/74.  No BP meds. Under dry. getting continuous TPNso watch volume closely and ^UF most likely.  7. Anemia CKD --see #1. Hgb7.3<7.5,  sp 3u prbc's here total. Continue aranesp - increased dose to 200qwk q Sat 8. MBD CKD --Corr Ca^. Holding hectorol, Phos 2.1, no binders,on TPN 9. COPD  10. H/o prior rectal cancer - in 2014 treated w/ chemoRx,radiation, laparoscopic low anterior resection with diverting loop ileostomy and subsequent ileostomy takedown. 11. GOC - seen by palliative care, pt is DNR   Kelly Splinter, MD 09/11/2020, 2:45  PM       Labs: Basic Metabolic Panel: Recent Labs  Lab 09/08/20 0133 09/09/20 0344 09/10/20 0330 09/11/20 0432 09/11/20 0719  NA 133* 136 133* QUESTIONABLE RESULTS, RECOMMEND RECOLLECT TO VERIFY 133*  K 4.1 3.5 4.0 QUESTIONABLE RESULTS, RECOMMEND RECOLLECT TO VERIFY 4.3  CL 99 101 98 QUESTIONABLE RESULTS, RECOMMEND RECOLLECT TO VERIFY 100  CO2 26 28 25  QUESTIONABLE RESULTS, RECOMMEND RECOLLECT TO VERIFY 24  GLUCOSE 118* 108* 118* QUESTIONABLE RESULTS, RECOMMEND RECOLLECT TO VERIFY 115*  BUN 32* 22 35* QUESTIONABLE RESULTS, RECOMMEND RECOLLECT TO VERIFY 49*  CREATININE 4.49* 3.17* 4.62* QUESTIONABLE RESULTS, RECOMMEND RECOLLECT TO VERIFY 5.38*  CALCIUM 9.4 9.2 10.0 QUESTIONABLE RESULTS, RECOMMEND RECOLLECT TO VERIFY 9.5  PHOS 2.7 2.1* 2.8  --   --    Liver Function Tests: Recent Labs  Lab 09/07/20 0046 09/07/20 1647 09/08/20 0133 09/10/20 0330  AST 13*  --  24 38  ALT 9  --  14 49*  ALKPHOS 73  --  83 93  BILITOT 0.4  --  0.3 0.6  PROT 4.9*  --  5.3* 5.4*  ALBUMIN 1.8* 2.0* 1.9* 2.1*   No results for input(s): LIPASE, AMYLASE in the last 168 hours. No results for input(s): AMMONIA in the last 168 hours. CBC: Recent Labs  Lab 09/07/20 1647 09/08/20 0133 09/09/20 0344 09/10/20 0330 09/11/20 0845  WBC 11.3* 12.5* 12.2* 16.9* 15.5*  NEUTROABS  --  9.1* 9.0* 13.7* 13.4*  HGB 7.6* 7.5* 7.3* 7.9*  7.4*  HCT 24.6* 23.8* 22.8* 25.0* 22.8*  MCV 87.9 87.5 88.7 89.6 89.8  PLT 245 239 227 242 263   Cardiac Enzymes: No results for input(s): CKTOTAL, CKMB, CKMBINDEX, TROPONINI in the last 168 hours. CBG: Recent Labs  Lab 09/10/20 1735 09/10/20 2013 09/11/20 0022 09/11/20 0352 09/11/20 1202  GLUCAP 108* 106* 116* 114* 126*    Studies/Results: No results found. Medications: . TPN ADULT (ION) 70 mL/hr at 09/10/20 1818  . TPN ADULT (ION)     . sodium chloride   Intravenous Once  . Chlorhexidine Gluconate Cloth  6 each Topical Daily  . darbepoetin (ARANESP)  injection - DIALYSIS  200 mcg Intravenous Q Sat-HD  . fidaxomicin  200 mg Oral BID  . niacin  250 mg Oral BID WC  . pantoprazole  40 mg Oral BID  . sodium chloride flush  10-40 mL Intracatheter Q12H

## 2020-09-11 NOTE — Progress Notes (Signed)
PHARMACY - TOTAL PARENTERAL NUTRITION CONSULT NOTE   Indication: Prolonged ileus in the setting of ischemic colitis  Patient Measurements: Height: 5\' 8"  (172.7 cm) Weight:  (Unable to check bed wt) IBW/kg (Calculated) : 68.4   Body mass index is 17.6 kg/m. Usual Weight: ~63kg  Assessment:  [name] with hx rectal cancer s/p radiation/chemo, anteriro resection and ileostomy reversal, afib on warfarin with admit INR 8.8 now s/p reversal, ESRD on HD TTS, PUD with recent GIB, anemia of chronic disease, CVA, COPD, gout, and HTN admitted with melena, C.diff colitis, prolonged ileus, malnutrition with poor PO intake >11 days and hypoglycemia requiring D10 infusion. Patient lost ~15% of body weight in one month. Failed attempt to place NGT 2/26. PICC placement ok'd by nephrology. Pharmacy consulted for TPN.   Glucose / Insulin: A1C 5.5%. BG controlled, SSI d/c'd 3/3 Electrolytes: corrected Ca high ~11 (none in TPN), Na 133, K 4.3; Mg 2.1 and Phos 2.8 on 3/7 Renal: ESRD on HD TTS - on schedule, currently in HD 3/8 Hepatic: LFTs / Tbili / TG wnl. Albumin 1.9, prealbumin up to 25.1 (3/7) <<  8.9 (3/2) Intake / Output; MIVF: loose stools x6 last 24 hrs, has chronic diarrhea, minimal residual UOP - 250 cc/24h GI Imaging: 2/18 CT Abd- Wall thickening of the sigmoid colon and rectum, consistent with acute colitis/proctitis 2/24 CT Abd with IMA occlusion but celiac and SMA patent; no evidence of acute ischemia. Increased distension of the stomach, small bowel and colon since 08/24/2020. 2/25 DG abd- no NGT, Gastric and small bowel dilatation 3/2 CT abd pelvis - likely focal colitis, diverticulosis, new LLL infiltrate, new density within gallbladder (likely sludge or excretion of contrast) 3/8 CT abd pelvis >> GI Surgeries / Procedures: 2/25 EGD- Mildly severe esophagitis with a small 2-3 mm ulcer with no bleeding; Retained fluid and small amounts of solid debris in stomach; attempted NGT multiple times but  unable to advance  2/26 Flex sig -severely ulcerated mucosa of the sigmoid colon/ rectum, possibly radiation changes vs ischemia vs infection  Central access: 3/1 TPN start date: 3/1   Nutritional Goals: (per RD recommendations 3/4) kCal: 1900-2100, Protein: 100-115g, Fluid: 1L + UOP per day Goal TPN rate is 70 mL/hr (provides 109g of protein and weekly avg of 1907 kcals per day)  Current Nutrition:  TPN CLD started 3/3, tolerating, no advancement per Surgery  Plan:  Continue TPN at goal rate of 70 mL/hr at 1800 - meeting 100% of estimated weekly protein and kCal needs (50g SMOF lipids on MWF only due to Producer, television/film/video) Electrolytes in TPN: continue Na to 120 mEq/L, K 25 mEq/L, Mg 6 mEq/L, Ca removed, Phos at 8 mmol/L, Cl:Ac 1:1 Add standard MVI and trace elements to TPN, remove chromium with ESRD requiring RRT SSI d/c'd on 3/3 due to lack of utilization - monitor CBGs F/u TPN labs, Surgery plans, GOC (to readdress end of week)  Thank you for allowing pharmacy to be a part of this patient's care.  Sloan Leiter, PharmD, BCPS, BCCCP Clinical Pharmacist Clinical phone for 09/11/2020: [phone] 09/11/2020 7:17 AM   **Pharmacist phone directory can now be found on [email or web address] ([email or web address]).  Listed under [place].

## 2020-09-12 ENCOUNTER — Telehealth: Payer: Medicare Other

## 2020-09-12 DIAGNOSIS — R1084 Generalized abdominal pain: Secondary | ICD-10-CM | POA: Diagnosis not present

## 2020-09-12 DIAGNOSIS — R140 Abdominal distension (gaseous): Secondary | ICD-10-CM | POA: Diagnosis not present

## 2020-09-12 DIAGNOSIS — A0472 Enterocolitis due to Clostridium difficile, not specified as recurrent: Secondary | ICD-10-CM | POA: Diagnosis not present

## 2020-09-12 DIAGNOSIS — E43 Unspecified severe protein-calorie malnutrition: Secondary | ICD-10-CM | POA: Diagnosis not present

## 2020-09-12 LAB — CBC WITH DIFFERENTIAL/PLATELET
Abs Immature Granulocytes: 0.18 10*3/uL — ABNORMAL HIGH (ref 0.00–0.07)
Basophils Absolute: 0 10*3/uL (ref 0.0–0.1)
Basophils Relative: 0 %
Eosinophils Absolute: 0.1 10*3/uL (ref 0.0–0.5)
Eosinophils Relative: 1 %
HCT: 24.6 % — ABNORMAL LOW (ref 39.0–52.0)
Hemoglobin: 7.3 g/dL — ABNORMAL LOW (ref 13.0–17.0)
Immature Granulocytes: 1 %
Lymphocytes Relative: 7 %
Lymphs Abs: 0.8 10*3/uL (ref 0.7–4.0)
MCH: 28.1 pg (ref 26.0–34.0)
MCHC: 29.7 g/dL — ABNORMAL LOW (ref 30.0–36.0)
MCV: 94.6 fL (ref 80.0–100.0)
Monocytes Absolute: 1.4 10*3/uL — ABNORMAL HIGH (ref 0.1–1.0)
Monocytes Relative: 11 %
Neutro Abs: 10 10*3/uL — ABNORMAL HIGH (ref 1.7–7.7)
Neutrophils Relative %: 80 %
Platelets: 257 10*3/uL (ref 150–400)
RBC: 2.6 MIL/uL — ABNORMAL LOW (ref 4.22–5.81)
RDW: 25.1 % — ABNORMAL HIGH (ref 11.5–15.5)
WBC: 12.6 10*3/uL — ABNORMAL HIGH (ref 4.0–10.5)
nRBC: 0 % (ref 0.0–0.2)

## 2020-09-12 LAB — GLUCOSE, CAPILLARY
Glucose-Capillary: 117 mg/dL — ABNORMAL HIGH (ref 70–99)
Glucose-Capillary: 111 mg/dL — ABNORMAL HIGH (ref 70–99)
Glucose-Capillary: 99 mg/dL (ref 70–99)
Glucose-Capillary: 105 mg/dL — ABNORMAL HIGH (ref 70–99)
Glucose-Capillary: 107 mg/dL — ABNORMAL HIGH (ref 70–99)

## 2020-09-12 MED ORDER — TRACE MINERALS CU-MN-SE-ZN 300-55-60-3000 MCG/ML IV SOLN
INTRAVENOUS | Status: AC
  Filled 2020-09-12: qty 728

## 2020-09-12 NOTE — Progress Notes (Signed)
Progress Note  11 Days Post-Op  Subjective: Patient reports abdominal pain and nausea are unchanged. He had about 4 total BM yesterday.   Objective: Vital signs in last 24 hours: Temp:  [97.5 F (36.4 C)-99.3 F (37.4 C)] 97.5 F (36.4 C) (03/09 0448) Pulse Rate:  [88-105] 89 (03/09 0448) Resp:  [16-18] 16 (03/09 0448) BP: (136-163)/(65-79) 136/72 (03/09 0448) SpO2:  [97 %-100 %] 100 % (03/09 0448) Last BM Date: 09/11/20  Intake/Output from previous day: 03/08 0701 - 03/09 0700 In: 1255.9 [P.O.:360; I.V.:895.9] Out: 1300 [Urine:300] Intake/Output this shift: No intake/output data recorded.  PE: General: pleasant, WD,cachecticmale who is laying inbedin NAD Heart: regular, rate, and rhythm.  Lungs: CTAB, no wheezes, rhonchi, or rales noted. Respiratory effort nonlabored Abd: soft,mild generalized ttp without peritonitis or guarding, ND, +BS, no masses, hernias, or organomegaly MS: all 4 extremities are symmetrical with no cyanosis, clubbing, or edema. Skin: warm and dry with no masses, lesions, or rashes Psych: A&Ox3 with an appropriate affect.   Lab Results:  Recent Labs    09/11/20 0845 09/12/20 0352  WBC 15.5* 12.6*  HGB 7.4* 7.3*  HCT 22.8* 24.6*  PLT 263 257   BMET Recent Labs    09/11/20 0432 09/11/20 0719  NA QUESTIONABLE RESULTS, RECOMMEND RECOLLECT TO VERIFY 133*  K QUESTIONABLE RESULTS, RECOMMEND RECOLLECT TO VERIFY 4.3  CL QUESTIONABLE RESULTS, RECOMMEND RECOLLECT TO VERIFY 100  CO2 QUESTIONABLE RESULTS, RECOMMEND RECOLLECT TO VERIFY 24  GLUCOSE QUESTIONABLE RESULTS, RECOMMEND RECOLLECT TO VERIFY 115*  BUN QUESTIONABLE RESULTS, RECOMMEND RECOLLECT TO VERIFY 49*  CREATININE QUESTIONABLE RESULTS, RECOMMEND RECOLLECT TO VERIFY 5.38*  CALCIUM QUESTIONABLE RESULTS, RECOMMEND RECOLLECT TO VERIFY 9.5   PT/INR No results for input(s): LABPROT, INR in the last 72 hours. CMP     Component Value Date/Time   NA 133 (L) 09/11/2020 0719   NA 140  02/24/2017 0000   NA 138 02/20/2014 1432   K 4.3 09/11/2020 0719   K 3.5 02/20/2014 1432   CL 100 09/11/2020 0719   CL 101 12/09/2012 1048   CO2 24 09/11/2020 0719   CO2 30 (H) 02/20/2014 1432   GLUCOSE 115 (H) 09/11/2020 0719   GLUCOSE 93 02/20/2014 1432   GLUCOSE 96 12/09/2012 1048   BUN 49 (H) 09/11/2020 0719   BUN 50 (A) 08/10/2017 0000   BUN 36.0 (H) 02/20/2014 1432   CREATININE 5.38 (H) 09/11/2020 0719   CREATININE 2.4 (H) 02/20/2014 1432   CALCIUM 9.5 09/11/2020 0719   CALCIUM 10.4 02/20/2014 1432   PROT 5.4 (L) 09/10/2020 0330   PROT 7.2 02/20/2014 1432   ALBUMIN 2.1 (L) 09/10/2020 0330   ALBUMIN 3.8 02/20/2014 1432   AST 38 09/10/2020 0330   AST 22 02/20/2014 1432   ALT 49 (H) 09/10/2020 0330   ALT 12 02/20/2014 1432   ALKPHOS 93 09/10/2020 0330   ALKPHOS 86 02/20/2014 1432   BILITOT 0.6 09/10/2020 0330   BILITOT 0.32 02/20/2014 1432   GFRNONAA 11 (L) 09/11/2020 0719   GFRAA 8 (L) 04/08/2019 1702   Lipase     Component Value Date/Time   LIPASE 47 08/15/2020 0659       Studies/Results: CT ABDOMEN PELVIS W CONTRAST  Result Date: 09/11/2020 CLINICAL DATA:  Infectious gastroenteritis or colitis. EXAM: CT ABDOMEN AND PELVIS WITH CONTRAST TECHNIQUE: Multidetector CT imaging of the abdomen and pelvis was performed using the standard protocol following bolus administration of intravenous contrast. CONTRAST:  162mL OMNIPAQUE IOHEXOL 300 MG/ML  SOLN COMPARISON:  September 05, 2020 FINDINGS: Lower chest: There is a trace left-sided pleural effusion with adjacent airspace disease suggestive of atelectasis. There are bilateral calcified pleural based plaques. Coronary artery disease is noted. Hepatobiliary: The liver is normal. There is gallbladder sludge without CT evidence for acute cholecystitis.There is no biliary ductal dilation. Pancreas: Normal contours without ductal dilatation. No peripancreatic fluid collection. Spleen: Unremarkable. Adrenals/Urinary Tract: --Adrenal  glands: Unremarkable. --Right kidney/ureter: Right-sided complex and simple renal cysts are again noted. --Left kidney/ureter: A left-sided complex and simple renal cysts are again noted. --Urinary bladder: Unremarkable. Stomach/Bowel: --Stomach/Duodenum: No hiatal hernia or other gastric abnormality. Normal duodenal course and caliber. --Small bowel: Unremarkable. --Colon: There is diffuse circumferential wall thickening of virtually all of the colon. There are scattered colonic diverticula. --Appendix: Normal. Vascular/Lymphatic: Atherosclerotic calcification is present within the non-aneurysmal abdominal aorta, without hemodynamically significant stenosis. --No retroperitoneal lymphadenopathy. --No mesenteric lymphadenopathy. --No pelvic or inguinal lymphadenopathy. Reproductive: The prostate gland is enlarged. Other: No ascites or free air. The abdominal wall is normal. Musculoskeletal. There is mild left femoral head avascular necrosis. There is no acute osseous abnormality. IMPRESSION: 1. Diffuse circumferential wall thickening of virtually all of the colon, consistent with infectious or inflammatory pancolitis. 2. Trace left-sided pleural effusion with adjacent airspace disease suggestive of atelectasis. 3. Bilateral calcified pleural based plaques, consistent with prior asbestos exposure. 4. Mild left femoral head avascular necrosis. 5. Additional chronic findings are noted, not substantially changed from prior study. Aortic Atherosclerosis (ICD10-I70.0). Electronically Signed   By: Constance Holster M.D.   On: 09/11/2020 15:43    Anti-infectives: Anti-infectives (From admission, onward)   Start     Dose/Rate Route Frequency Ordered Stop   09/06/20 2200  fidaxomicin (DIFICID) tablet 200 mg        200 mg Oral 2 times daily 09/06/20 1530 09/16/20 2159   09/06/20 0930  vancomycin (VANCOCIN) 50 mg/mL oral solution 500 mg  Status:  Discontinued        500 mg Oral Every 6 hours 09/06/20 0830 09/06/20  1530   09/01/20 1600  metroNIDAZOLE (FLAGYL) IVPB 500 mg  Status:  Discontinued        500 mg 100 mL/hr over 60 Minutes Intravenous Every 12 hours 09/01/20 1315 09/05/20 1430   08/26/20 1000  vancomycin (VANCOCIN) 50 mg/mL oral solution 500 mg        500 mg Oral 4 times daily 08/26/20 0740 09/03/20 2159   08/24/20 2100  vancomycin (VANCOCIN) 50 mg/mL oral solution 125 mg  Status:  Discontinued        125 mg Oral 4 times daily 08/24/20 1717 08/26/20 0740       Assessment/Plan Hx of rectal CA s/p LAR and chemo/radiation  HTN Hx of CVA COPD ESRD on HDTTS A. Fibon coumadin - coumadin held Code status DNR/DNI - palliative following, plan is to continue current treatments for now and family will decide by the end of this week if they would like to continue to pursue aggressive care  C diff colitis,Ischemic colitis -Patient continues to have generalized pain but abdomen is soft without signs of peritonitis. No signs of pneumatosis or portal venous gas on CT, although there is persistent thickening consistent with colitis. Surgical resection would be high risk given prior radiation to the pelvis and prior rectal dissection with coloanal anastomosis. For now recommend continued medical management of colitis. Ensure patient stays adequately hydrated and maintain normotension to maximize perfusion of the colon. - Given persistent pain and CT findings,abx were resumed 3/3 -  [place] for sips of clear liquids, but do not advance diet any further. Continue TPN for nutritional support. - CBC pending, abdominal exam stable but not really improved - repeat CT A/P shows pancolitis but no megacolon  - We will see again Friday but are available if there are any acute changes  FEN:CLD, IVF; TPN VTE: heparin with HD ID: PO vanc 2/18>2/28; IV flagyl 1/26>3/2; PO dificid 3/3>>  LOS: 19 days    [name] , [facility] Surgery 09/12/2020, 8:43 AM Please see Amion for pager number during  day hours 7:00am-4:30pm

## 2020-09-12 NOTE — Progress Notes (Signed)
PROGRESS NOTE    Jason TIFFANY Sr.   LSL:[id number]  DOB: Nov 29, 1945  PCP: [name], MD    DOA: 08/24/2020 LOS: 77   Brief Narrative   Jason Cotta Sr. is a 75 year old gentleman with past medical history significant for chronic atrial fibrillation on Coumadin, peptic ulcer disease, ESRD on HD TTS, history of rectal cancer status post low anterior resection and reversal of ileostomy with chemotherapy/radiation who presented to the ED with abdominal pain/cramping, nausea/vomiting/diarrhea and rectal bleeding. Patient with follow-up with his GI physician due to recent hospitalization from GI bleed due to duodenal ulcer, patient had repeat labs performed which was notably positive for C. difficile colitis. Patient was started on medication for his C. difficile infection outpatient, but he noticed a large amount of blood in his stool which was concerning which prompted further evaluation in the ED.  In the ED, patient was found to have an elevated INR, he was given oral vitamin K and started on Protonix. [facility] was consulted. Hospital service consulted for further evaluation and management of GI bleed with supratherapeutic INR in the setting of C. difficile colitis.  Assessment & Plan   Active Problems:   Hematochezia   GIB (gastrointestinal bleeding)   C. difficile colitis   Generalized abdominal pain   Non-intractable vomiting   Abdominal distension (gaseous)   Gastritis and gastroduodenitis   Noninfectious gastroenteritis   Colon ulcer   Intestinal occlusion (HCC)   Protein-calorie malnutrition, severe   Ischemic colitis (HCC)   C. difficile colitis - CT angiogram abdomen/pelvis 2/24 suspicious for ileus, no obstruction. Flexible sigmoidoscopy 09/01/2020 with severely ulcerated mucosa at the sigmoid colon/rectum, possibly radiation changes versus ischemia versus infection. --ID, general surgery, lumbar GI are following --Leukocytosis improving --Continue Dificid 200 mg  p.o. twice daily x10 days (end date 3/12)  Acute on chronic GI bleeding Acute blood loss anemia, POA Supratherapeutic INR, POA Patient reports large bloody bowel movement prior to ED presentation. INR notably elevated at 8.8 on admission. Received oral vitamin K for reversal. Recent hospitalization with finding of duodenal ulcer on EGD. Discharged on Protonix twice daily. EGD on 09/01/2020 with no evidence of active bleeding. Colonoscopy 2/26 with findings consistent with severe radiation changes, proctitis, ischemic vs inflammatory colitis, strictures. --GI following, appreciate assistance --Status post transfusion of 2 units PRBCs on 2/19, 1 unit on 2/24 --Aranesp qSat with HD --Monitor Hbg, transfuse if < 7.0  Nausea/vomiting Ileus Hx rectal cancer s/p LAR, XRT, chemotherapy No gastric outlet obstruction noted on endoscopy. Colonoscopy notable for anal stenosis with rectal stenosis and ulceration from previous surgery/radiation. CT angiogram abdomen/pelvis with occlusion IMA but patent celiac and SMA with collateral filling. --General surgery following --CT abdomen pelvis on 3/8: Mild increased perirectal inflammatory changes consistent with focal colitis, colon decompressed, stomach decompressed, no small bowel dilation --On clear liquid diet and TPN, no further advancement of diet per surgery  Supratherapeutic INR INR 8.8 admission, reversed with oral vitamin K. --Continue to hold Coumadin --Continue to monitor on telemetry  ESRD on HD TTS --Nephrology following for inpatient dialysis  Hypoglycemic episodes - likely 2/2 prolonged NPO status.  Now on TPN.  Monitor glucose closely. Hypoglycemia protocol.  Adult failure to thrive Severe protein calorie malnutrition 2/2 prolonged illness and ileus Body mass index is 17.53 kg/m.  Albumin 1.9.   Oral intake limited due to nausea/vomiting and C. difficile colitis with ileus.  --Continues clear liquid diet without further advancement  per general surgery --Continue TPN  Weakness/debility/deconditioning Patient with prolonged hospitalization due to ileus and severe C. difficile colitis.  Prior to hospitalization, patient was fairly independent in regards to his ADLs.  His decline during this hospitalization has also been complicated by his prolonged n.p.o. status. --Continue PT/OT efforts while inpatient, expect SNF discharge when medically stable  Goals of care --Palliative care following, appreciate assistance.  "Discussed with patient and spouse extensively, very poor surgical candidate if needed and possibly would not survive surgical intervention.  Discussed with spouse consideration of comfort measures/palliative if patient does not respond with conservative measures.  Family meeting with sons [name] and [name] and Palliative care NP on 09/10/2020.  Shaun and Stanford Breed. seemed to understand the severity of their fathers illness and all questions regarding the care and prognosis answered.  Discussed with both of them that if their father does not make any considerable improvements by the end of the week, need to consider transitioning of care to a more palliative approach in which their father could return home under home hospice."    DVT prophylaxis: SCDs Start: 08/24/20 2254   Diet:  Diet Orders (From admission, onward)    Start     Ordered   09/06/20 1338  Diet clear liquid Room service appropriate? Yes; Fluid consistency: Thin  Diet effective now       Comments: Start slow and stop if abdominal pain worsens  Question Answer Comment  Room service appropriate? Yes   Fluid consistency: Thin      09/06/20 1337            Code Status: DNR    Subjective 09/12/20    Patient seen with wife at bedside today.  He reports mid abdominal pain but denies nausea vomiting currently.  Denies fevers or chills.  No acute events reported.   Disposition Plan & Communication   Status is: Inpatient  Remains inpatient  appropriate because:IV treatments appropriate due to intensity of illness or inability to take PO.  Inadequate p.o. intake, on TPN   Dispo:  Patient From: Home  Planned Disposition: To be determined  Medically stable for discharge: No     Family Communication: Wife at bedside on rounds today   Consults, Procedures, Significant Events   Consultants:   Gastroenterology  General surgery  Nephrology  Palliative care  Infectious disease  Procedures:   EGD  Colonoscopy   Antimicrobials:  Anti-infectives (From admission, onward)   Start     Dose/Rate Route Frequency Ordered Stop   09/06/20 2200  fidaxomicin (DIFICID) tablet 200 mg        200 mg Oral 2 times daily 09/06/20 1530 09/16/20 2159   09/06/20 0930  vancomycin (VANCOCIN) 50 mg/mL oral solution 500 mg  Status:  Discontinued        500 mg Oral Every 6 hours 09/06/20 0830 09/06/20 1530   09/01/20 1600  metroNIDAZOLE (FLAGYL) IVPB 500 mg  Status:  Discontinued        500 mg 100 mL/hr over 60 Minutes Intravenous Every 12 hours 09/01/20 1315 09/05/20 1430   08/26/20 1000  vancomycin (VANCOCIN) 50 mg/mL oral solution 500 mg        500 mg Oral 4 times daily 08/26/20 0740 09/03/20 2159   08/24/20 2100  vancomycin (VANCOCIN) 50 mg/mL oral solution 125 mg  Status:  Discontinued        125 mg Oral 4 times daily 08/24/20 1717 08/26/20 0740        Micro    Objective  Vitals:   09/11/20 1206 09/11/20 2006 09/12/20 0448 09/12/20 1408  BP: (!) 148/65 (!) 145/69 136/72 (!) 144/53  Pulse: 94 88 89 95  Resp: 18 16 16 18   Temp: 98.6 F (37 C) 98.1 F (36.7 C) (!) 97.5 F (36.4 C) 98.2 F (36.8 C)  TempSrc: Oral Oral Oral Oral  SpO2: 100% 100% 100% 100%  Height:        Intake/Output Summary (Last 24 hours) at 09/12/2020 1537 Last data filed at 09/12/2020 1047 Gross per 24 hour  Intake 1255.9 ml  Output 550 ml  Net 705.9 ml   Filed Weights    Physical Exam:  General exam: awake, alert, no acute  distress HEENT: moist mucus membranes, hearing grossly normal  Respiratory system: CTAB with shallow inspirations, no wheezes, rales or rhonchi, normal respiratory effort. Cardiovascular system: normal S1/S2, RRR, no pedal edema.   Gastrointestinal system: soft, tender on palpation with voluntary guarding, no rebound tenderness, positive bowel sounds Central nervous system: no gross focal neurologic deficits, normal speech Extremities: moves all, no edema, normal tone Skin: dry, intact, normal temperature  Labs   Data Reviewed: I have personally reviewed following labs and imaging studies  CBC: Recent Labs  Lab 09/08/20 0133 09/09/20 0344 09/10/20 0330 09/11/20 0845 09/12/20 0352  WBC 12.5* 12.2* 16.9* 15.5* 12.6*  NEUTROABS 9.1* 9.0* 13.7* 13.4* 10.0*  HGB 7.5* 7.3* 7.9* 7.4* 7.3*  HCT 23.8* 22.8* 25.0* 22.8* 24.6*  MCV 87.5 88.7 89.6 89.8 94.6  PLT 239 227 242 263 814   Basic Metabolic Panel: Recent Labs  Lab 09/06/20 0524 09/07/20 0046 09/07/20 1647 09/08/20 0133 09/09/20 0344 09/10/20 0330 09/11/20 0432 09/11/20 0719  NA 135 135 133* 133* 136 133* QUESTIONABLE RESULTS, RECOMMEND RECOLLECT TO VERIFY 133*  K 3.2* 3.1* 4.0 4.1 3.5 4.0 QUESTIONABLE RESULTS, RECOMMEND RECOLLECT TO VERIFY 4.3  CL 102 101 99 99 101 98 QUESTIONABLE RESULTS, RECOMMEND RECOLLECT TO VERIFY 100  CO2 24 27 25 26 28 25  QUESTIONABLE RESULTS, RECOMMEND RECOLLECT TO VERIFY 24  GLUCOSE 117* 106* 120* 118* 108* 118* QUESTIONABLE RESULTS, RECOMMEND RECOLLECT TO VERIFY 115*  BUN 31* 16 30* 32* 22 35* QUESTIONABLE RESULTS, RECOMMEND RECOLLECT TO VERIFY 49*  CREATININE 5.61* 3.29* 4.04* 4.49* 3.17* 4.62* QUESTIONABLE RESULTS, RECOMMEND RECOLLECT TO VERIFY 5.38*  CALCIUM 8.8* 8.8* 9.1 9.4 9.2 10.0 QUESTIONABLE RESULTS, RECOMMEND RECOLLECT TO VERIFY 9.5  MG 1.8 2.2  --  2.2 2.1 2.1  --   --   PHOS 3.2 1.7* 3.1 2.7 2.1* 2.8  --   --    GFR: Estimated Creatinine Clearance: 9.1 mL/min (A) (by C-G formula  based on SCr of 5.38 mg/dL (H)). Liver Function Tests: Recent Labs  Lab 09/06/20 0524 09/07/20 0046 09/07/20 1647 09/08/20 0133 09/10/20 0330  AST 10* 13*  --  24 38  ALT 9 9  --  14 49*  ALKPHOS 73 73  --  83 93  BILITOT 0.1* 0.4  --  0.3 0.6  PROT 4.7* 4.9*  --  5.3* 5.4*  ALBUMIN 1.8* 1.8* 2.0* 1.9* 2.1*   No results for input(s): LIPASE, AMYLASE in the last 168 hours. No results for input(s): AMMONIA in the last 168 hours. Coagulation Profile: No results for input(s): INR, PROTIME in the last 168 hours. Cardiac Enzymes: No results for input(s): CKTOTAL, CKMB, CKMBINDEX, TROPONINI in the last 168 hours. BNP (last 3 results) No results for input(s): PROBNP in the last 8760 hours. HbA1C: No results for input(s): HGBA1C in the last  72 hours. CBG: Recent Labs  Lab 09/11/20 2002 09/11/20 2344 09/12/20 0445 09/12/20 0745 09/12/20 1151  GLUCAP 108* 114* 117* 111* 99   Lipid Profile: Recent Labs    09/10/20 0330  TRIG 54   Thyroid Function Tests: No results for input(s): TSH, T4TOTAL, FREET4, T3FREE, THYROIDAB in the last 72 hours. Anemia Panel: No results for input(s): VITAMINB12, FOLATE, FERRITIN, TIBC, IRON, RETICCTPCT in the last 72 hours. Sepsis Labs: No results for input(s): PROCALCITON, LATICACIDVEN in the last 168 hours.  No results found for this or any previous visit (from the past 240 hour(s)).    Imaging Studies   CT ABDOMEN PELVIS W CONTRAST  Result Date: 09/11/2020 CLINICAL DATA:  Infectious gastroenteritis or colitis. EXAM: CT ABDOMEN AND PELVIS WITH CONTRAST TECHNIQUE: Multidetector CT imaging of the abdomen and pelvis was performed using the standard protocol following bolus administration of intravenous contrast. CONTRAST:  163mL OMNIPAQUE IOHEXOL 300 MG/ML  SOLN COMPARISON:  September 05, 2020 FINDINGS: Lower chest: There is a trace left-sided pleural effusion with adjacent airspace disease suggestive of atelectasis. There are bilateral calcified  pleural based plaques. Coronary artery disease is noted. Hepatobiliary: The liver is normal. There is gallbladder sludge without CT evidence for acute cholecystitis.There is no biliary ductal dilation. Pancreas: Normal contours without ductal dilatation. No peripancreatic fluid collection. Spleen: Unremarkable. Adrenals/Urinary Tract: --Adrenal glands: Unremarkable. --Right kidney/ureter: Right-sided complex and simple renal cysts are again noted. --Left kidney/ureter: A left-sided complex and simple renal cysts are again noted. --Urinary bladder: Unremarkable. Stomach/Bowel: --Stomach/Duodenum: No hiatal hernia or other gastric abnormality. Normal duodenal course and caliber. --Small bowel: Unremarkable. --Colon: There is diffuse circumferential wall thickening of virtually all of the colon. There are scattered colonic diverticula. --Appendix: Normal. Vascular/Lymphatic: Atherosclerotic calcification is present within the non-aneurysmal abdominal aorta, without hemodynamically significant stenosis. --No retroperitoneal lymphadenopathy. --No mesenteric lymphadenopathy. --No pelvic or inguinal lymphadenopathy. Reproductive: The prostate gland is enlarged. Other: No ascites or free air. The abdominal wall is normal. Musculoskeletal. There is mild left femoral head avascular necrosis. There is no acute osseous abnormality. IMPRESSION: 1. Diffuse circumferential wall thickening of virtually all of the colon, consistent with infectious or inflammatory pancolitis. 2. Trace left-sided pleural effusion with adjacent airspace disease suggestive of atelectasis. 3. Bilateral calcified pleural based plaques, consistent with prior asbestos exposure. 4. Mild left femoral head avascular necrosis. 5. Additional chronic findings are noted, not substantially changed from prior study. Aortic Atherosclerosis (ICD10-I70.0). Electronically Signed   By: Constance Holster M.D.   On: 09/11/2020 15:43     Medications   Scheduled  Meds: . sodium chloride   Intravenous Once  . Chlorhexidine Gluconate Cloth  6 each Topical Daily  . darbepoetin (ARANESP) injection - DIALYSIS  200 mcg Intravenous Q Sat-HD  . fidaxomicin  200 mg Oral BID  . niacin  250 mg Oral BID WC  . pantoprazole  40 mg Oral BID  . sodium chloride flush  10-40 mL Intracatheter Q12H   Continuous Infusions: . TPN ADULT (ION) 70 mL/hr at 09/11/20 1718  . TPN ADULT (ION)         LOS: 19 days    Time spent: 30 minutes    Ezekiel Slocumb, DO [facility]  09/12/2020, 3:37 PM      If 7PM-7AM, please contact night-coverage. How to contact the [facility] Attending or Consulting provider [place] or covering provider during after hours [place], for this patient?    1. Check the care team in [facility] and look  for a) attending/consulting [place] provider listed and b) the [facility] team listed 2. Log into [email or web address] and use [place]'s universal password to access. If you do not have the password, please contact the hospital operator. 3. Locate the [facility] provider you are looking for under [facility] and page to a number that you can be directly reached. 4. If you still have difficulty reaching the provider, please page the [facility] (Director on Call) for the Hospitalists listed on amion for assistance.

## 2020-09-12 NOTE — Progress Notes (Addendum)
[facility] Progress Note   Subjective: Seen in room. C/O burning epigastric pain, wants to be repositioned. Notified RN. Denies SOB. No issues with HD yesterday.   Objective Vitals:   09/11/20 1120 09/11/20 1206 09/11/20 2006 09/12/20 0448  BP: (!) 156/72 (!) 148/65 (!) 145/69 136/72  Pulse: 95 94 88 89  Resp: 18 18 16 16   Temp: 99.3 F (37.4 C) 98.6 F (37 C) 98.1 F (36.7 C) (!) 97.5 F (36.4 C)  TempSrc: Oral Oral Oral Oral  SpO2: 97% 100% 100% 100%  Height:       Physical Exam General: Chronically ill appearing elderly male in NAD Heart: S1,S2 RRR No M/R/G Lungs: CTAB. No WOB.  Abdomen: flat, soft with tenderness upper quadrants into epigastric area.  Extremities: Trace R ankle edema.  Dialysis Access: L AVF + T/B   Additional Objective Labs: Basic Metabolic Panel: Recent Labs  Lab 09/08/20 0133 09/09/20 0344 09/10/20 0330 09/11/20 0432 09/11/20 0719  NA 133* 136 133* QUESTIONABLE RESULTS, RECOMMEND RECOLLECT TO VERIFY 133*  K 4.1 3.5 4.0 QUESTIONABLE RESULTS, RECOMMEND RECOLLECT TO VERIFY 4.3  CL 99 101 98 QUESTIONABLE RESULTS, RECOMMEND RECOLLECT TO VERIFY 100  CO2 26 28 25  QUESTIONABLE RESULTS, RECOMMEND RECOLLECT TO VERIFY 24  GLUCOSE 118* 108* 118* QUESTIONABLE RESULTS, RECOMMEND RECOLLECT TO VERIFY 115*  BUN 32* 22 35* QUESTIONABLE RESULTS, RECOMMEND RECOLLECT TO VERIFY 49*  CREATININE 4.49* 3.17* 4.62* QUESTIONABLE RESULTS, RECOMMEND RECOLLECT TO VERIFY 5.38*  CALCIUM 9.4 9.2 10.0 QUESTIONABLE RESULTS, RECOMMEND RECOLLECT TO VERIFY 9.5  PHOS 2.7 2.1* 2.8  --   --    Liver Function Tests: Recent Labs  Lab 09/07/20 0046 09/07/20 1647 09/08/20 0133 09/10/20 0330  AST 13*  --  24 38  ALT 9  --  14 49*  ALKPHOS 73  --  83 93  BILITOT 0.4  --  0.3 0.6  PROT 4.9*  --  5.3* 5.4*  ALBUMIN 1.8* 2.0* 1.9* 2.1*   No results for input(s): LIPASE, AMYLASE in the last 168 hours. CBC: Recent Labs  Lab 09/08/20 0133 09/09/20 0344  09/10/20 0330 09/11/20 0845 09/12/20 0352  WBC 12.5* 12.2* 16.9* 15.5* 12.6*  NEUTROABS 9.1* 9.0* 13.7* 13.4* 10.0*  HGB 7.5* 7.3* 7.9* 7.4* 7.3*  HCT 23.8* 22.8* 25.0* 22.8* 24.6*  MCV 87.5 88.7 89.6 89.8 94.6  PLT 239 227 242 263 257   Blood Culture    Component Value Date/Time   SDES IN/OUT CATH URINE 08/17/2020 1234   SPECREQUEST  08/17/2020 1234    NONE Performed at [facility] [address] [place], [place] [address]    CULT 4,000 COLONIES/mL ENTEROCOCCUS GALLINARUM (A) 08/17/2020 1234   REPTSTATUS 08/21/2020 FINAL 08/17/2020 1234    Cardiac Enzymes: No results for input(s): CKTOTAL, CKMB, CKMBINDEX, TROPONINI in the last 168 hours. CBG: Recent Labs  Lab 09/11/20 1609 09/11/20 2002 09/11/20 2344 09/12/20 0445 09/12/20 0745  GLUCAP 99 108* 114* 117* 111*   Iron Studies: No results for input(s): IRON, TIBC, TRANSFERRIN, FERRITIN in the last 72 hours. @lablastinr3 @ Studies/Results: CT ABDOMEN PELVIS W CONTRAST  Result Date: 09/11/2020 CLINICAL DATA:  Infectious gastroenteritis or colitis. EXAM: CT ABDOMEN AND PELVIS WITH CONTRAST TECHNIQUE: Multidetector CT imaging of the abdomen and pelvis was performed using the standard protocol following bolus administration of intravenous contrast. CONTRAST:  184mL OMNIPAQUE IOHEXOL 300 MG/ML  SOLN COMPARISON:  September 05, 2020 FINDINGS: Lower chest: There is a trace left-sided pleural effusion with adjacent airspace disease suggestive of  atelectasis. There are bilateral calcified pleural based plaques. Coronary artery disease is noted. Hepatobiliary: The liver is normal. There is gallbladder sludge without CT evidence for acute cholecystitis.There is no biliary ductal dilation. Pancreas: Normal contours without ductal dilatation. No peripancreatic fluid collection. Spleen: Unremarkable. Adrenals/Urinary Tract: --Adrenal glands: Unremarkable. --Right kidney/ureter: Right-sided complex and simple renal cysts are again noted.  --Left kidney/ureter: A left-sided complex and simple renal cysts are again noted. --Urinary bladder: Unremarkable. Stomach/Bowel: --Stomach/Duodenum: No hiatal hernia or other gastric abnormality. Normal duodenal course and caliber. --Small bowel: Unremarkable. --Colon: There is diffuse circumferential wall thickening of virtually all of the colon. There are scattered colonic diverticula. --Appendix: Normal. Vascular/Lymphatic: Atherosclerotic calcification is present within the non-aneurysmal abdominal aorta, without hemodynamically significant stenosis. --No retroperitoneal lymphadenopathy. --No mesenteric lymphadenopathy. --No pelvic or inguinal lymphadenopathy. Reproductive: The prostate gland is enlarged. Other: No ascites or free air. The abdominal wall is normal. Musculoskeletal. There is mild left femoral head avascular necrosis. There is no acute osseous abnormality. IMPRESSION: 1. Diffuse circumferential wall thickening of virtually all of the colon, consistent with infectious or inflammatory pancolitis. 2. Trace left-sided pleural effusion with adjacent airspace disease suggestive of atelectasis. 3. Bilateral calcified pleural based plaques, consistent with prior asbestos exposure. 4. Mild left femoral head avascular necrosis. 5. Additional chronic findings are noted, not substantially changed from prior study. Aortic Atherosclerosis (ICD10-I70.0). Electronically Signed   By: Constance Holster M.D.   On: 09/11/2020 15:43   Medications: . TPN ADULT (ION) 70 mL/hr at 09/11/20 1718  . TPN ADULT (ION)     . sodium chloride   Intravenous Once  . Chlorhexidine Gluconate Cloth  6 each Topical Daily  . darbepoetin (ARANESP) injection - DIALYSIS  200 mcg Intravenous Q Sat-HD  . fidaxomicin  200 mg Oral BID  . niacin  250 mg Oral BID WC  . pantoprazole  40 mg Oral BID  . sodium chloride flush  10-40 mL Intracatheter Q12H     OP HD: [place] TTS  4h 450/500 58kg 3K/2Ca P2 L AVF  -No  Heparin -Mircera 50 mcg IV q 2 weeks (to start 2/22)  -Venofer 50mg  IV weekly -Hectorol 7 mcg IV TIW   Assessment/Plan 1. C. Diff colitis - on po Vanc/Florastor/ dificid. Having persistent abd pain and diarrhea. Flex sig 2/26 showed severely ulcerated mucosa of the sigmoid colon/ rectum, possibly radiation changes vs ischemia vs infection. Per primary/GI/CCS.   2. GI bleed/ ABL anemia: w/ supra therapeutic INR on admit. SP EGD2/9with duodenal ulcer, treated w/ clip+ injection. On Protonix. S/P 3 units of PRBCs since admission. GI following.   3. Nutrition - is now getting IV TPN   4. ESRD - HD TTS.Next HD 09/13/20 Tuesday.No heparin . 5. AFib- on coumadin. Supratherapeutic INR on admission.Reversed  6. HTN/Volume -a.m.BP 131/74.  No BP meds. He is losing body weight. Under dry. getting continuous TPNso watch volume closely. Na down to 133. Net UF with HD 09/799 liter. Increase UF and attempt to lower volume as tolerated with HD tomorrow. .  7. Anemia CKD --see #1. HGB 7.3 today. Continue aranesp. Recently increased  increased dose to 200 mcg IV q Saturday. Transfuse PRN.  8. MBD CKD --Corr Ca^.Holdinghectorol, Phos 2.1, no binders,on TPN 9. H/O COPD  10. H/O prior rectal cancer - in 2014 treated w/ chemoRx,radiation, laparoscopic low anterior resection with diverting loop ileostomy and subsequent ileostomy takedown. 11. GOC - seen by palliative care, pt is DNR   Jason [name]. Brown NP-C 09/12/2020, 11:04  AM  [employer] [phone]

## 2020-09-12 NOTE — Progress Notes (Signed)
Occupational Therapy Treatment Patient Details Name: Jason DULWORTH Sr. MRN: [id number] DOB: 1945/09/11 Today's Date: 09/12/2020    History of present illness Pt is a 75 y.o. male recently admitted 08/15/20-08/19/20 with upper GIB and duodenal ulcer, now readmitted 08/24/20 with C. difficile collitis, lower GIB. Transfer 4E to 6N on 3/4. PMH includes afib on Coumadin, peptic ulcer disease, rectal CA, ESRD (HD TTS), CVA, gout, COPD, s/p partial lung lobectomy (2017).   OT comments  Pt with abdominal pain and weakness, declined OOB or EOB. Rolled using rail as OT completed pericare and linen change. Pt stating his was uncomfortable when up in chair, offered to get pt a cushion for his chair, but he continued to politely decline. Repositioned in bed with pt assisting. RN notified that pt wants pain meds.   Follow Up Recommendations  SNF    Equipment Recommendations  3 in 1 bedside commode    Recommendations for Other Services      Precautions / Restrictions Precautions Precautions: Fall;Other (comment) Precaution Comments: WBAT in RLE in CAM boot since last admission, enteric Required Braces or Orthoses: Other Brace Other Brace: CAM walker boot       Mobility Bed Mobility Overal bed mobility: Needs Assistance Bed Mobility: Rolling Rolling: Modified independent (Device/Increase time)         General bed mobility comments: rolled with rail for pericare    Transfers                      Balance                                           ADL either performed or assessed with clinical judgement   ADL Overall ADL's : Needs assistance/impaired                             Toileting- Clothing Manipulation and Hygiene: Total assistance;Bed level               Vision       Perception     Praxis      Cognition Arousal/Alertness: Awake/alert Behavior During Therapy: Flat affect                                             Exercises     Shoulder Instructions       General Comments      Pertinent Vitals/ Pain       Pain Assessment: Faces Faces Pain Scale: Hurts even more Pain Location: abdomen Pain Descriptors / Indicators: Grimacing;Discomfort;Constant Pain Intervention(s): Monitored during session;Patient requesting pain meds-RN notified  Home Living                                          Prior Functioning/Environment              Frequency  Min 2X/week        Progress Toward Goals  OT Goals(current goals can now be found in the care plan section)  Progress towards OT goals: Not progressing toward goals - comment (limited to bed level today)  Acute Rehab  OT Goals Patient Stated Goal: pain relief OT Goal Formulation: With patient Time For Goal Achievement: 09/26/20 Potential to Achieve Goals: [place]  Plan Discharge plan remains appropriate    Co-evaluation                 AM-PAC OT "6 Clicks" Daily Activity     Outcome Measure   Help from another person eating meals?: None Help from another person taking care of personal grooming?: A Little Help from another person toileting, which includes using toliet, bedpan, or urinal?: Total Help from another person bathing (including washing, rinsing, drying)?: Total Help from another person to put on and taking off regular upper body clothing?: A Little Help from another person to put on and taking off regular lower body clothing?: A Lot 6 Click Score: 14    End of Session    OT Visit Diagnosis: Unsteadiness on feet (R26.81);Other abnormalities of gait and mobility (R26.89);Muscle weakness (generalized) (M62.81);Pain;Dizziness and giddiness (R42)   Activity Tolerance Patient limited by pain;Patient limited by fatigue   Patient Left in bed;with call bell/phone within reach   Nurse Communication Patient requests pain meds        Time: 1251-1309 OT Time Calculation (min): 18 min  Charges: OT  General Charges $OT Visit: 1 Visit OT Treatments $Self Care/Home Management : 8-22 mins  Jason [name], OTR/L Acute Rehabilitation Services Pager: [phone] Office: [phone]   Malka So 09/12/2020, 1:13 PM

## 2020-09-12 NOTE — Progress Notes (Addendum)
Daily Rounding Note  09/12/2020, 9:06 AM  LOS: 19 days   SUBJECTIVE:   Chief complaint:  colitis   Frequent loose stools, diffuse abd pain and some nausea w/o vomiting continue.   OBJECTIVE:         Vital signs in last 24 hours:    Temp:  [97.5 F (36.4 C)-99.3 F (37.4 C)] 97.5 F (36.4 C) (03/09 0448) Pulse Rate:  [88-100] 89 (03/09 0448) Resp:  [16-18] 16 (03/09 0448) BP: (136-163)/(65-79) 136/72 (03/09 0448) SpO2:  [97 %-100 %] 100 % (03/09 0448) Last BM Date: 09/11/20 Filed Weights   General: Looks chronically ill, malnourished. Heart: RRR. Chest: No labored breathing.  No cough Abdomen: Soft, tender, active bowel sounds Extremities: No CCE Neuro/Psych: Alert.  Calm.  Oriented x3.  Fluid speech.  Lab Results: Recent Labs    09/10/20 0330 09/11/20 0845 09/12/20 0352  WBC 16.9* 15.5* 12.6*  HGB 7.9* 7.4* 7.3*  HCT 25.0* 22.8* 24.6*  PLT 242 263 257   BMET Recent Labs    09/10/20 0330 09/11/20 0432 09/11/20 0719  NA 133* QUESTIONABLE RESULTS, RECOMMEND RECOLLECT TO VERIFY 133*  K 4.0 QUESTIONABLE RESULTS, RECOMMEND RECOLLECT TO VERIFY 4.3  CL 98 QUESTIONABLE RESULTS, RECOMMEND RECOLLECT TO VERIFY 100  CO2 25 QUESTIONABLE RESULTS, RECOMMEND RECOLLECT TO VERIFY 24  GLUCOSE 118* QUESTIONABLE RESULTS, RECOMMEND RECOLLECT TO VERIFY 115*  BUN 35* QUESTIONABLE RESULTS, RECOMMEND RECOLLECT TO VERIFY 49*  CREATININE 4.62* QUESTIONABLE RESULTS, RECOMMEND RECOLLECT TO VERIFY 5.38*  CALCIUM 10.0 QUESTIONABLE RESULTS, RECOMMEND RECOLLECT TO VERIFY 9.5   LFT Recent Labs    09/10/20 0330  PROT 5.4*  ALBUMIN 2.1*  AST 38  ALT 49*  ALKPHOS 93  BILITOT 0.6   PT/INR No results for input(s): LABPROT, INR in the last 72 hours. Hepatitis Panel No results for input(s): HEPBSAG, HCVAB, HEPAIGM, HEPBIGM in the last 72 hours.  Studies/Results: CT ABDOMEN PELVIS W CONTRAST  Result Date: 09/11/2020 CLINICAL  DATA:  Infectious gastroenteritis or colitis. EXAM: CT ABDOMEN AND PELVIS WITH CONTRAST TECHNIQUE: Multidetector CT imaging of the abdomen and pelvis was performed using the standard protocol following bolus administration of intravenous contrast. CONTRAST:  157mL OMNIPAQUE IOHEXOL 300 MG/ML  SOLN COMPARISON:  September 05, 2020 FINDINGS: Lower chest: There is a trace left-sided pleural effusion with adjacent airspace disease suggestive of atelectasis. There are bilateral calcified pleural based plaques. Coronary artery disease is noted. Hepatobiliary: The liver is normal. There is gallbladder sludge without CT evidence for acute cholecystitis.There is no biliary ductal dilation. Pancreas: Normal contours without ductal dilatation. No peripancreatic fluid collection. Spleen: Unremarkable. Adrenals/Urinary Tract: --Adrenal glands: Unremarkable. --Right kidney/ureter: Right-sided complex and simple renal cysts are again noted. --Left kidney/ureter: A left-sided complex and simple renal cysts are again noted. --Urinary bladder: Unremarkable. Stomach/Bowel: --Stomach/Duodenum: No hiatal hernia or other gastric abnormality. Normal duodenal course and caliber. --Small bowel: Unremarkable. --Colon: There is diffuse circumferential wall thickening of virtually all of the colon. There are scattered colonic diverticula. --Appendix: Normal. Vascular/Lymphatic: Atherosclerotic calcification is present within the non-aneurysmal abdominal aorta, without hemodynamically significant stenosis. --No retroperitoneal lymphadenopathy. --No mesenteric lymphadenopathy. --No pelvic or inguinal lymphadenopathy. Reproductive: The prostate gland is enlarged. Other: No ascites or free air. The abdominal wall is normal. Musculoskeletal. There is mild left femoral head avascular necrosis. There is no acute osseous abnormality. IMPRESSION: 1. Diffuse circumferential wall thickening of virtually all of the colon, consistent with infectious or  inflammatory pancolitis. 2. Trace left-sided pleural  effusion with adjacent airspace disease suggestive of atelectasis. 3. Bilateral calcified pleural based plaques, consistent with prior asbestos exposure. 4. Mild left femoral head avascular necrosis. 5. Additional chronic findings are noted, not substantially changed from prior study. Aortic Atherosclerosis (ICD10-I70.0). Electronically Signed   By: Constance Holster M.D.   On: 09/11/2020 15:43   Scheduled Meds: . sodium chloride   Intravenous Once  . Chlorhexidine Gluconate Cloth  6 each Topical Daily  . darbepoetin (ARANESP) injection - DIALYSIS  200 mcg Intravenous Q Sat-HD  . fidaxomicin  200 mg Oral BID  . niacin  250 mg Oral BID WC  . pantoprazole  40 mg Oral BID  . sodium chloride flush  10-40 mL Intracatheter Q12H   Continuous Infusions: . TPN ADULT (ION) 70 mL/hr at 09/11/20 1718  . TPN ADULT (ION)     PRN Meds:.acetaminophen **OR** acetaminophen, HYDROmorphone (DILAUDID) injection, hyoscyamine, lip balm, metoprolol tartrate, ondansetron (ZOFRAN) IV, promethazine, sodium chloride flush   ASSESMENT:    *Diarrhea, abdominal pain. C diff toxin and GDH +, completed treatement w standard vanc later accelerated to higher dose and IV Flagyl. Day 7/10 Dificid per ID rec.  2/26 Flex sig: Likely ischemic ulceration and fibrosis. 2/26 HYQ:[id number], gastritis, retained gastric fluid.Path:reactive gastropathy. 09/05/2020 CTAPwcontrast:Mild perirectal inflammation/focal colitis, aortic atherosclerosis. WBC count had improved to 11.3 on 3/4 but climbing back to 16.9 today.   09/11/20 CTAP w contrast: diffuse colon wall thickening c/w contractures vs inflammatory pancolitis.  Trace pleural effusion. Clinically no change  * A fib. Supratherapeutic INR at admission. Chronic coumadin on hold  * ESRD  * Anemia of CKD. Weekly Aranesp. Hgb low of 6.8. S/p 3 PRBCs 2/19 - 2/24. Hgb 7.3.   * Poor po intake.  Severe malnutrition of chronic and acute illness per RD. On clears. TPN in place as of 3/1.   *   Minor rise in ALT to 49, O/w normal LFTs.  Suspect due to the TPN.   *    Hyponatremia.    *   Rectal cancer in 2014 treated with chemo, radiation, LAR, diverting loop ileostomy with ultimate ileostomy takedown.   PLAN   *   Supportive care.  *   Complete 10 d Dificid (per ID)   Azucena Freed  09/12/2020, 9:06 AM Phone [phone]   ________________________________________________________________________  Velora Heckler GI MD note:  I personally examined the patient, reviewed the data and agree with the assessment and plan described above.  CT shows his entire colon is thickened.  He was c. Diff + about 3-4 weeks ago and flex sigmoidoscopy 2 weeks ago showed ulcerated colon. Biopsies were not consistent with IBD really, pointing more towards and ischemic process.  He is being retreated for C. Diff, on day 7/10 dificid. CT angio 2-3 weeks ago showed completely occluded IMA.  Clinically he is slowly failing. Has persistent diarrhea, severe abd pains, unable to eat and is on TNA.  I think this is probably all due to his very sick colon. Although I am not sure what has caused his colon to be so sick I think C. Diff, ischemia lead the list with IBD being quite a bit less likely.    There has been discussion of colectomy. I think it would be reasonable to allow him to complete his dificid course and see how he is doing clinically.  If he is still doing poorly then decision should be made about colectomy. GI can consider repeat endoscopic evaluation of his colon if  desired.  I recommend that vascular surgery be contacted to comment on his IMA occlusion and if that could account for his colon process.   Owens Loffler, MD [facility] Pager [phone]

## 2020-09-12 NOTE — Progress Notes (Addendum)
PHARMACY - TOTAL PARENTERAL NUTRITION CONSULT NOTE   Indication: Prolonged ileus in the setting of ischemic colitis  Patient Measurements: Height: 5\' 8"  (172.7 cm) Weight:  (202lb bed wt) IBW/kg (Calculated) : 68.4   Body mass index is 17.6 kg/m. Usual Weight: ~63kg  Assessment:  [name] with hx rectal cancer s/p radiation/chemo, anteriro resection and ileostomy reversal, afib on warfarin with admit INR 8.8 now s/p reversal, ESRD on HD TTS, PUD with recent GIB, anemia of chronic disease, CVA, COPD, gout, and HTN admitted with melena, C.diff colitis, prolonged ileus, malnutrition with poor PO intake >11 days and hypoglycemia requiring D10 infusion. Patient lost ~15% of body weight in one month. Failed attempt to place NGT 2/26. PICC placement ok'd by nephrology. Pharmacy consulted for TPN.   Glucose / Insulin: A1C 5.5%. BG controlled, SSI d/c'd 3/3 Electrolytes: corrected Ca high ~11 (none in TPN), Na 133, K 4.3; Mg 2.1 and Phos 2.8 on 3/7 Renal: ESRD on HD TTS - on schedule, currently in HD 3/8 Hepatic: LFTs / Tbili / TG wnl. Albumin 1.9, prealbumin up to 25.1 (3/7) <<  8.9 (3/2) Intake / Output; MIVF: loose stools x6 last 24 hrs, has chronic diarrhea, minimal residual UOP - 250 cc/24h GI Imaging: 2/18 CT Abd- Wall thickening of the sigmoid colon and rectum, consistent with acute colitis/proctitis 2/24 CT Abd with IMA occlusion but celiac and SMA patent; no evidence of acute ischemia. Increased distension of the stomach, small bowel and colon since 08/24/2020. 2/25 DG abd- no NGT, Gastric and small bowel dilatation 3/2 CT abd pelvis - likely focal colitis, diverticulosis, new LLL infiltrate, new density within gallbladder (likely sludge or excretion of contrast) 3/8 CT abd pelvis - pancolitis, no megacolon GI Surgeries / Procedures: 2/25 EGD- Mildly severe esophagitis with a small 2-3 mm ulcer with no bleeding; Retained fluid and small amounts of solid debris in stomach; attempted NGT  multiple times but unable to advance  2/26 Flex sig -severely ulcerated mucosa of the sigmoid colon/ rectum, possibly radiation changes vs ischemia vs infection  Central access: 3/1 TPN start date: 3/1   Nutritional Goals: (per RD recommendations 3/4) kCal: 1900-2100, Protein: 100-115g, Fluid: 1L + UOP per day Goal TPN rate is 70 mL/hr (provides 109g of protein and weekly avg of 1907 kcals per day)  Current Nutrition:  TPN CLD started 3/3, tolerating, no advancement per Surgery  Plan:  Continue TPN at goal rate of 70 mL/hr at 1800 - meeting 100% of estimated weekly protein and kCal needs (50g SMOF lipids on MWF only due to [employer]) - next 3/9 Electrolytes in TPN: continue Na to 120 mEq/L, K 25 mEq/L, Mg 6 mEq/L, Ca removed, Phos at 8 mmol/L, Cl:Ac 1:1 Add standard MVI and trace elements to TPN, remove chromium with ESRD requiring RRT SSI d/c'd on 3/3 due to lack of utilization - monitor CBGs F/u TPN labs in AM, Surgery plans, GOC  Thank you for allowing pharmacy to be a part of this patient's care.  Sloan Leiter, PharmD, BCPS, BCCCP Clinical Pharmacist Clinical phone for 09/12/2020: [phone] 09/12/2020 8:45 AM   **Pharmacist phone directory can now be found on [email or web address] ([email or web address]).  Listed under [place].

## 2020-09-13 DIAGNOSIS — R140 Abdominal distension (gaseous): Secondary | ICD-10-CM | POA: Diagnosis not present

## 2020-09-13 DIAGNOSIS — A0472 Enterocolitis due to Clostridium difficile, not specified as recurrent: Secondary | ICD-10-CM | POA: Diagnosis not present

## 2020-09-13 DIAGNOSIS — R1084 Generalized abdominal pain: Secondary | ICD-10-CM | POA: Diagnosis not present

## 2020-09-13 DIAGNOSIS — K633 Ulcer of intestine: Secondary | ICD-10-CM | POA: Diagnosis not present

## 2020-09-13 DIAGNOSIS — E43 Unspecified severe protein-calorie malnutrition: Secondary | ICD-10-CM | POA: Diagnosis not present

## 2020-09-13 LAB — CBC WITH DIFFERENTIAL/PLATELET
Abs Immature Granulocytes: 0.16 10*3/uL — ABNORMAL HIGH (ref 0.00–0.07)
Basophils Absolute: 0 10*3/uL (ref 0.0–0.1)
Basophils Relative: 0 %
Eosinophils Absolute: 0.1 10*3/uL (ref 0.0–0.5)
Eosinophils Relative: 1 %
HCT: 24.8 % — ABNORMAL LOW (ref 39.0–52.0)
Hemoglobin: 7.5 g/dL — ABNORMAL LOW (ref 13.0–17.0)
Immature Granulocytes: 1 %
Lymphocytes Relative: 5 %
Lymphs Abs: 0.8 10*3/uL (ref 0.7–4.0)
MCH: 29 pg (ref 26.0–34.0)
MCHC: 30.2 g/dL (ref 30.0–36.0)
MCV: 95.8 fL (ref 80.0–100.0)
Monocytes Absolute: 1.4 10*3/uL — ABNORMAL HIGH (ref 0.1–1.0)
Monocytes Relative: 9 %
Neutro Abs: 13.9 10*3/uL — ABNORMAL HIGH (ref 1.7–7.7)
Neutrophils Relative %: 84 %
Platelets: 274 10*3/uL (ref 150–400)
RBC: 2.59 MIL/uL — ABNORMAL LOW (ref 4.22–5.81)
RDW: 25.2 % — ABNORMAL HIGH (ref 11.5–15.5)
WBC: 16.4 10*3/uL — ABNORMAL HIGH (ref 4.0–10.5)
nRBC: 0 % (ref 0.0–0.2)

## 2020-09-13 LAB — GLUCOSE, CAPILLARY
Glucose-Capillary: 103 mg/dL — ABNORMAL HIGH (ref 70–99)
Glucose-Capillary: 105 mg/dL — ABNORMAL HIGH (ref 70–99)
Glucose-Capillary: 107 mg/dL — ABNORMAL HIGH (ref 70–99)
Glucose-Capillary: 109 mg/dL — ABNORMAL HIGH (ref 70–99)
Glucose-Capillary: 111 mg/dL — ABNORMAL HIGH (ref 70–99)
Glucose-Capillary: 112 mg/dL — ABNORMAL HIGH (ref 70–99)
Glucose-Capillary: 117 mg/dL — ABNORMAL HIGH (ref 70–99)

## 2020-09-13 LAB — COMPREHENSIVE METABOLIC PANEL
ALT: 103 U/L — ABNORMAL HIGH (ref 0–44)
AST: 57 U/L — ABNORMAL HIGH (ref 15–41)
Albumin: 2 g/dL — ABNORMAL LOW (ref 3.5–5.0)
Alkaline Phosphatase: 122 U/L (ref 38–126)
Anion gap: 8 (ref 5–15)
BUN: 40 mg/dL — ABNORMAL HIGH (ref 8–23)
CO2: 28 mmol/L (ref 22–32)
Calcium: 9.9 mg/dL (ref 8.9–10.3)
Chloride: 101 mmol/L (ref 98–111)
Creatinine, Ser: 4.3 mg/dL — ABNORMAL HIGH (ref 0.61–1.24)
GFR, Estimated: 14 mL/min — ABNORMAL LOW (ref >60)
Glucose, Bld: 93 mg/dL (ref 70–99)
Potassium: 4.3 mmol/L (ref 3.5–5.1)
Sodium: 137 mmol/L (ref 135–145)
Total Bilirubin: 0.3 mg/dL (ref 0.3–1.2)
Total Protein: 5.2 g/dL — ABNORMAL LOW (ref 6.5–8.1)

## 2020-09-13 LAB — HEPATITIS B SURFACE ANTIGEN: Hepatitis B Surface Ag: NONREACTIVE

## 2020-09-13 LAB — PHOSPHORUS: Phosphorus: 2.6 mg/dL (ref 2.5–4.6)

## 2020-09-13 LAB — MAGNESIUM: Magnesium: 2.1 mg/dL (ref 1.7–2.4)

## 2020-09-13 MED ORDER — TRACE MINERALS CU-MN-SE-ZN 300-55-60-3000 MCG/ML IV SOLN
INTRAVENOUS | Status: DC
  Filled 2020-09-13: qty 728

## 2020-09-13 MED ORDER — HYDROMORPHONE HCL 1 MG/ML IJ SOLN
INTRAMUSCULAR | Status: AC
  Filled 2020-09-13: qty 0.5

## 2020-09-13 NOTE — Progress Notes (Signed)
PHARMACY - TOTAL PARENTERAL NUTRITION CONSULT NOTE   Indication: Prolonged ileus in the setting of ischemic colitis  Patient Measurements: Height: 5\' 8"  (172.7 cm) Weight: 71.7 kg (158 lb) IBW/kg (Calculated) : 68.4   Body mass index is 24.02 kg/m. Usual Weight: ~63kg  Assessment:  [name] with hx rectal cancer s/p radiation/chemo, anteriro resection and ileostomy reversal, afib on warfarin with admit INR 8.8 now s/p reversal, ESRD on HD TTS, PUD with recent GIB, anemia of chronic disease, CVA, COPD, gout, and HTN admitted with melena, C.diff colitis, prolonged ileus, malnutrition with poor PO intake >11 days and hypoglycemia requiring D10 infusion. Patient lost ~15% of body weight in one month. Failed attempt to place NGT 2/26. PICC placement ok'd by nephrology. Pharmacy consulted for TPN.   Glucose / Insulin: A1C 5.5%. BG controlled, SSI d/c'd 3/3 Electrolytes: corrected Ca high ~11 (none in TPN), Na 137, K 4.3; Mg 2.1 and Phos 2.6  Renal: ESRD on HD TTS - on schedule, currently in HD 3/10 Hepatic: LFTs up slightly AST/ALT 57/103, Tbili / TG wnl. Albumin 2, prealbumin up to 25.1 (3/7) <<  8.9 (3/2) Intake / Output; MIVF: loose stools x10 last 24 hrs, has chronic diarrhea, minimal residual UOP - 600 cc/24h GI Imaging: 2/18 CT Abd- Wall thickening of the sigmoid colon and rectum, consistent with acute colitis/proctitis 2/24 CT Abd with IMA occlusion but celiac and SMA patent; no evidence of acute ischemia. Increased distension of the stomach, small bowel and colon since 08/24/2020. 2/25 DG abd- no NGT, Gastric and small bowel dilatation 3/2 CT abd pelvis - likely focal colitis, diverticulosis, new LLL infiltrate, new density within gallbladder (likely sludge or excretion of contrast) 3/8 CT abd pelvis - pancolitis, no megacolon GI Surgeries / Procedures: 2/25 EGD- Mildly severe esophagitis with a small 2-3 mm ulcer with no bleeding; Retained fluid and small amounts of solid debris in  stomach; attempted NGT multiple times but unable to advance  2/26 Flex sig -severely ulcerated mucosa of the sigmoid colon/ rectum, possibly radiation changes vs ischemia vs infection  Central access: 3/1 TPN start date: 3/1   Nutritional Goals: (per RD recommendations 3/4) kCal: 1900-2100, Protein: 100-115g, Fluid: 1L + UOP per day Goal TPN rate is 70 mL/hr (provides 109g of protein and weekly avg of 1907 kcals per day)  Current Nutrition:  TPN CLD started 3/3, tolerating, no advancement per Surgery  Plan:  Continue TPN at goal rate of 70 mL/hr at 1800 - meeting 100% of estimated weekly protein and kCal needs (50g SMOF lipids on MWF only due to [employer]) - next 3/11 Electrolytes in TPN: continue Na to 120 mEq/L, K 25 mEq/L, Mg 6 mEq/L, Ca removed, Phos at 8 mmol/L, Cl:Ac 1:1 Add standard MVI and trace elements to TPN, remove chromium with ESRD requiring RRT SSI d/c'd on 3/3 due to lack of utilization - monitor CBGs F/u TPN labs Mon/Thurs, Surgery plans, GOC  Thank you for allowing pharmacy to be a part of this patient's care.  Alanda Slim, PharmD, [facility] Clinical Pharmacist Please see AMION for all Pharmacists' Contact Phone Numbers 09/13/2020, 9:13 AM

## 2020-09-13 NOTE — Progress Notes (Addendum)
Nutrition Follow-up  DOCUMENTATION CODES:   Severe malnutrition in context of chronic illness,Underweight  INTERVENTION:   -TPN management per pharmacy -Continue niacin supplementation for suspected deficiency -RD will follow for diet advancement and/or ability to transition to enteral nutrition support  NUTRITION DIAGNOSIS:   Severe Malnutrition related to chronic illness (ESRD, COPD chronic in nature exascerbated by acute GI related illness) as evidenced by severe fat depletion,severe muscle depletion,moderate muscle depletion,moderate fat depletion,energy intake < 75% for > or equal to 1 month.  Ongoing  GOAL:   Patient will meet greater than or equal to 90% of their needs  Progressing   MONITOR:   Labs,Weight trends,Skin,Diet advancement  REASON FOR ASSESSMENT:   Consult New TPN/TNA  ASSESSMENT:   66 YOM admitted for gastrointestinal bleeding. PMH of HTN, diverticulosis, anemia, COPD, rectal ca s/p XRT and LAR w/ diverting loop ileostomy with known rectal stricture, CVA, A. Fib, PUD. Ongoing ESRD on HD TTS, MBD CKD, C. Diff., B3 deficiency, severe esophagitis, n/v/diarrhea, ileus. Potential for sigmoid resection and possible total abdominal colectomy, permanent colostomy vs ileostomy if condition does not improve.  2/09 - hemostasis clip placement, hemostasis control 2/14 - EGD with duodenal ulcer w VV, injected and endoclipped 2/26 - EGD/colonoscopy, biopsy, flexible sigmoidoscopy 3/4- advanced to clear liquid diet 3/9- CT of abdomen and pelvis revealed pancolitis without dilation of the colon, pneumatosis, or micro perf   Reviewed I/O's: +101 ml x 24 hours and +2.9 L since 08/30/20  UOP: 600 ml x 24 hours  Per GI notes, pt clinically declining; plan to consider colectomy is status worsens.   Pt on clear liquid diet, with variable intake. Noted meal completion 0-75%. Per general surgery notes, no plans to advance diet further at this time. Pt continues on TPN for  nutritional support: TPN @ 70 ml/hr, which provides 1693 kcals and 109 grams protein, meeting 89% of estimated kcals and 100% of estimated nutritional needs. Pt to receive lipids on Monday, Wednesdays, and Fridays due to [employer].   Palliative care following for ongoing goals of care discussions; pt and family would like to continue therapy now and hopeful for improving.  Medications reviewed and include aranesp and niacin.   Labs reviewed: K, Mg, and Phos WDL. CBGS: 107-112 (inpatient orders for glycemic control are none).   Diet Order:   Diet Order            Diet clear liquid Room service appropriate? Yes; Fluid consistency: Thin  Diet effective now                 EDUCATION NEEDS:   No education needs have been identified at this time  Skin:  Skin Assessment: Skin Integrity Issues: Skin Integrity Issues:: Incisions Incisions: open, R buttocks  Last BM:  09/13/20  Height:   Ht Readings from Last 1 Encounters:  08/26/20 5\' 8"  (1.727 m)    Weight:   Wt Readings from Last 1 Encounters:  09/13/20 71.7 kg    Ideal Body Weight:  70 kg  BMI:  Body mass index is 24.02 kg/m.  Estimated Nutritional Needs:   Kcal:  1900-2100  Protein:  100-115 g  Fluid:  1L + UOP    Loistine Chance, RD, LDN, [place] Registered Dietitian II Certified Diabetes Care and Education Specialist Please refer to [facility] for RD and/or RD on-call/weekend/after hours pager

## 2020-09-13 NOTE — Progress Notes (Signed)
PROGRESS NOTE    Jason ELWOOD Sr.   ZOX:[id number]  DOB: 30-Dec-1945  PCP: [name], MD    DOA: 08/24/2020 LOS: 76   Brief Narrative   Jason Cotta Sr. is a 75 year old gentleman with past medical history significant for chronic atrial fibrillation on Coumadin, peptic ulcer disease, ESRD on HD TTS, history of rectal cancer status post low anterior resection and reversal of ileostomy with chemotherapy/radiation who presented to the ED with abdominal pain/cramping, nausea/vomiting/diarrhea and rectal bleeding. Patient with follow-up with his GI physician due to recent hospitalization from GI bleed due to duodenal ulcer, patient had repeat labs performed which was notably positive for C. difficile colitis. Patient was started on medication for his C. difficile infection outpatient, but he noticed a large amount of blood in his stool which was concerning which prompted further evaluation in the ED.  In the ED, patient was found to have an elevated INR, he was given oral vitamin K and started on Protonix. [facility] was consulted. Hospital service consulted for further evaluation and management of GI bleed with supratherapeutic INR in the setting of C. difficile colitis.  Assessment & Plan   Active Problems:   Hematochezia   GIB (gastrointestinal bleeding)   C. difficile colitis   Generalized abdominal pain   Non-intractable vomiting   Abdominal distension (gaseous)   Gastritis and gastroduodenitis   Noninfectious gastroenteritis   Colon ulcer   Intestinal occlusion (HCC)   Protein-calorie malnutrition, severe   Ischemic colitis (HCC)   C. difficile colitis - CT angiogram abdomen/pelvis 2/24 suspicious for ileus, no obstruction. Flexible sigmoidoscopy 09/01/2020 with severely ulcerated mucosa at the sigmoid colon/rectum, possibly radiation changes versus ischemia versus infection. --ID, general surgery, lumbar GI are following --Leukocytosis improving --Continue Dificid 200 mg  p.o. twice daily x10 days (end date 3/12)  Acute on chronic GI bleeding Acute blood loss anemia, POA Supratherapeutic INR, POA Patient reports large bloody bowel movement prior to ED presentation. INR notably elevated at 8.8 on admission. Received oral vitamin K for reversal. Recent hospitalization with finding of duodenal ulcer on EGD. Discharged on Protonix twice daily. EGD on 09/01/2020 with no evidence of active bleeding. Colonoscopy 2/26 with findings consistent with severe radiation changes, proctitis, ischemic vs inflammatory colitis, strictures. --GI following, appreciate assistance --Status post transfusion of 2 units PRBCs on 2/19, 1 unit on 2/24 --Aranesp qSat with HD --Monitor Hbg, transfuse if < 7.0  Nausea/vomiting Ileus Hx rectal cancer s/p LAR, XRT, chemotherapy No gastric outlet obstruction noted on endoscopy. Colonoscopy notable for anal stenosis with rectal stenosis and ulceration from previous surgery/radiation. CT angiogram abdomen/pelvis with occlusion IMA but patent celiac and SMA with collateral filling. --General surgery following --CT abdomen pelvis on 3/8: Mild increased perirectal inflammatory changes consistent with focal colitis, colon decompressed, stomach decompressed, no small bowel dilation --On clear liquid diet and TPN, no further advancement of diet per surgery  Supratherapeutic INR INR 8.8 admission, reversed with oral vitamin K. --Continue to hold Coumadin --Continue to monitor on telemetry  ESRD on HD TTS --Nephrology following for inpatient dialysis  Hypoglycemic episodes - likely 2/2 prolonged NPO status.  Now on TPN.  Monitor glucose closely. Hypoglycemia protocol.  Adult failure to thrive Severe protein calorie malnutrition 2/2 prolonged illness and ileus Body mass index is 17.53 kg/m.  Albumin 1.9.   Oral intake limited due to nausea/vomiting and C. difficile colitis with ileus.  --Continues clear liquid diet without further advancement  per general surgery --Continue TPN  Weakness/debility/deconditioning Patient with prolonged hospitalization due to ileus and severe C. difficile colitis.  Prior to hospitalization, patient was fairly independent in regards to his ADLs.  His decline during this hospitalization has also been complicated by his prolonged n.p.o. status. --Continue PT/OT efforts while inpatient, expect SNF discharge when medically stable  Goals of care --Palliative care following, appreciate assistance.  "Discussed with patient and spouse extensively, very poor surgical candidate if needed and possibly would not survive surgical intervention.  Discussed with spouse consideration of comfort measures/palliative if patient does not respond with conservative measures.  Family meeting with sons [name] and Elza Sortor and Palliative care NP on 09/10/2020.  Shaun and Stanford Breed. seemed to understand the severity of their fathers illness and all questions regarding the care and prognosis answered.  Discussed with both of them that if their father does not make any considerable improvements by the end of the week, need to consider transitioning of care to a more palliative approach in which their father could return home under home hospice."    DVT prophylaxis: SCDs Start: 08/24/20 2254   Diet:  Diet Orders (From admission, onward)    Start     Ordered   09/06/20 1338  Diet clear liquid Room service appropriate? Yes; Fluid consistency: Thin  Diet effective now       Comments: Start slow and stop if abdominal pain worsens  Question Answer Comment  Room service appropriate? Yes   Fluid consistency: Thin      09/06/20 1337            Code Status: DNR    Subjective 09/13/20    Patient during dialysis today.  He reports a little abdominal pain but somewhat better today.  Endorses some nausea but without vomiting.  No other acute complaints.   Disposition Plan & Communication   Status is: Inpatient  Remains inpatient  appropriate because:IV treatments appropriate due to intensity of illness or inability to take PO.  Inadequate p.o. intake, on TPN   Dispo:  Patient From: Home  Planned Disposition: [facility]  Medically stable for discharge: No     Family Communication: Wife at bedside on rounds today   Consults, Procedures, Significant Events   Consultants:   Gastroenterology  General surgery  Nephrology  Palliative care  Infectious disease  Procedures:   EGD  Colonoscopy   Antimicrobials:  Anti-infectives (From admission, onward)   Start     Dose/Rate Route Frequency Ordered Stop   09/06/20 2200  fidaxomicin (DIFICID) tablet 200 mg        200 mg Oral 2 times daily 09/06/20 1530 09/16/20 2159   09/06/20 0930  vancomycin (VANCOCIN) 50 mg/mL oral solution 500 mg  Status:  Discontinued        500 mg Oral Every 6 hours 09/06/20 0830 09/06/20 1530   09/01/20 1600  metroNIDAZOLE (FLAGYL) IVPB 500 mg  Status:  Discontinued        500 mg 100 mL/hr over 60 Minutes Intravenous Every 12 hours 09/01/20 1315 09/05/20 1430   08/26/20 1000  vancomycin (VANCOCIN) 50 mg/mL oral solution 500 mg        500 mg Oral 4 times daily 08/26/20 0740 09/03/20 2159   08/24/20 2100  vancomycin (VANCOCIN) 50 mg/mL oral solution 125 mg  Status:  Discontinued        125 mg Oral 4 times daily 08/24/20 1717 08/26/20 0740        Micro    Objective  Vitals:   09/13/20 1100 09/13/20 1122 09/13/20 1138 09/13/20 1227  BP: (!) 147/57 (!) 168/79 (!) 187/82 (!) 158/70  Pulse: 90 88 85 95  Resp:  16    Temp:  98.6 F (37 C)  97.9 F (36.6 C)  TempSrc:  Oral  Oral  SpO2:  100%  100%  Weight:  69.6 kg    Height:        Intake/Output Summary (Last 24 hours) at 09/13/2020 1629 Last data filed at 09/13/2020 1553 Gross per 24 hour  Intake 1180.64 ml  Output 2550 ml  Net -1369.36 ml   Filed Weights   09/13/20 0745 09/13/20 1122  Weight: 71.7 kg 69.6 kg    Physical Exam:  General exam:  awake, alert, no acute distress Respiratory system: CTAB with shallow inspirations, no wheezes, rales or rhonchi, normal respiratory effort. Cardiovascular system: normal S1/S2, RRR, no pedal edema.   Gastrointestinal system: soft, somewhat less tender on palpation today, no rebound tenderness, positive bowel sounds Central nervous system: no gross focal neurologic deficits, normal speech   Labs   Data Reviewed: I have personally reviewed following labs and imaging studies  CBC: Recent Labs  Lab 09/09/20 0344 09/10/20 0330 09/11/20 0845 09/12/20 0352 09/13/20 0337  WBC 12.2* 16.9* 15.5* 12.6* 16.4*  NEUTROABS 9.0* 13.7* 13.4* 10.0* 13.9*  HGB 7.3* 7.9* 7.4* 7.3* 7.5*  HCT 22.8* 25.0* 22.8* 24.6* 24.8*  MCV 88.7 89.6 89.8 94.6 95.8  PLT 227 242 263 257 144   Basic Metabolic Panel: Recent Labs  Lab 09/07/20 0046 09/07/20 1647 09/08/20 0133 09/09/20 0344 09/10/20 0330 09/11/20 0432 09/11/20 0719 09/13/20 0337  NA 135 133* 133* 136 133* QUESTIONABLE RESULTS, RECOMMEND RECOLLECT TO VERIFY 133* 137  K 3.1* 4.0 4.1 3.5 4.0 QUESTIONABLE RESULTS, RECOMMEND RECOLLECT TO VERIFY 4.3 4.3  CL 101 99 99 101 98 QUESTIONABLE RESULTS, RECOMMEND RECOLLECT TO VERIFY 100 101  CO2 27 25 26 28 25  QUESTIONABLE RESULTS, RECOMMEND RECOLLECT TO VERIFY 24 28  GLUCOSE 106* 120* 118* 108* 118* QUESTIONABLE RESULTS, RECOMMEND RECOLLECT TO VERIFY 115* 93  BUN 16 30* 32* 22 35* QUESTIONABLE RESULTS, RECOMMEND RECOLLECT TO VERIFY 49* 40*  CREATININE 3.29* 4.04* 4.49* 3.17* 4.62* QUESTIONABLE RESULTS, RECOMMEND RECOLLECT TO VERIFY 5.38* 4.30*  CALCIUM 8.8* 9.1 9.4 9.2 10.0 QUESTIONABLE RESULTS, RECOMMEND RECOLLECT TO VERIFY 9.5 9.9  MG 2.2  --  2.2 2.1 2.1  --   --  2.1  PHOS 1.7* 3.1 2.7 2.1* 2.8  --   --  2.6   GFR: Estimated Creatinine Clearance: 14.8 mL/min (A) (by C-G formula based on SCr of 4.3 mg/dL (H)). Liver Function Tests: Recent Labs  Lab 09/07/20 0046 09/07/20 1647 09/08/20 0133  09/10/20 0330 09/13/20 0337  AST 13*  --  24 38 57*  ALT 9  --  14 49* 103*  ALKPHOS 73  --  83 93 122  BILITOT 0.4  --  0.3 0.6 0.3  PROT 4.9*  --  5.3* 5.4* 5.2*  ALBUMIN 1.8* 2.0* 1.9* 2.1* 2.0*   No results for input(s): LIPASE, AMYLASE in the last 168 hours. No results for input(s): AMMONIA in the last 168 hours. Coagulation Profile: No results for input(s): INR, PROTIME in the last 168 hours. Cardiac Enzymes: No results for input(s): CKTOTAL, CKMB, CKMBINDEX, TROPONINI in the last 168 hours. BNP (last 3 results) No results for input(s): PROBNP in the last 8760 hours. HbA1C: No results for input(s): HGBA1C in the last 72 hours. CBG: Recent Labs  Lab 09/12/20 2358 09/13/20 0351 09/13/20 0732 09/13/20 1224 09/13/20 1549  GLUCAP 107* 109* 112* 105* 103*   Lipid Profile: No results for input(s): CHOL, HDL, LDLCALC, TRIG, CHOLHDL, LDLDIRECT in the last 72 hours. Thyroid Function Tests: No results for input(s): TSH, T4TOTAL, FREET4, T3FREE, THYROIDAB in the last 72 hours. Anemia Panel: No results for input(s): VITAMINB12, FOLATE, FERRITIN, TIBC, IRON, RETICCTPCT in the last 72 hours. Sepsis Labs: No results for input(s): PROCALCITON, LATICACIDVEN in the last 168 hours.  No results found for this or any previous visit (from the past 240 hour(s)).    Imaging Studies   No results found.   Medications   Scheduled Meds: . sodium chloride   Intravenous Once  . Chlorhexidine Gluconate Cloth  6 each Topical Daily  . darbepoetin (ARANESP) injection - DIALYSIS  200 mcg Intravenous Q Sat-HD  . fidaxomicin  200 mg Oral BID  . HYDROmorphone      . niacin  250 mg Oral BID WC  . pantoprazole  40 mg Oral BID  . sodium chloride flush  10-40 mL Intracatheter Q12H   Continuous Infusions: . TPN ADULT (ION) 70 mL/hr at 09/12/20 2349  . TPN ADULT (ION)         LOS: 20 days    Time spent: 25 minutes with > 50% spent at bedside and in coordination of care.   Ezekiel Slocumb, DO [facility]  09/13/2020, 4:29 PM      If 7PM-7AM, please contact night-coverage. How to contact the [facility] Attending or Consulting provider [place] or covering provider during after hours [place], for this patient?    1. Check the care team in [facility] and look for a) attending/consulting [facility] provider listed and b) the [facility] team listed 2. Log into [email or web address] and use [place] universal password to access. If you do not have the password, please contact the hospital operator. 3. Locate the [facility] provider you are looking for under [facility] and page to a number that you can be directly reached. 4. If you still have difficulty reaching the provider, please page the [facility] (Director on Call) for the Hospitalists listed on amion for assistance.

## 2020-09-13 NOTE — Progress Notes (Addendum)
Patient ID: Jason JANDREAU Sr., male   DOB: 1946-03-06, 75 y.o.   MRN: [id number]    Progress Note   Subjective   day # 19 CC; colitis, Cdiff  Day #8 of 10 Dificid TPN weekly Aranesp  CT abdomen and pelvis 09/11/2020-diffuse circumferential wall thickening of virtually all of the colon consistent with infectious or inflammatory pancolitis, no free air  Today's labs-WBC up to 16.4, hemoglobin 7.5 stable  Just back from dialysis, he says his abdomen is still hurting a lot and burning and cramping continues with diarrhea.  No appetite   Objective   Vital signs in last 24 hours: Temp:  [97.9 F (36.6 C)-98.6 F (37 C)] 97.9 F (36.6 C) (03/10 1227) Pulse Rate:  [85-101] 95 (03/10 1227) Resp:  [16-18] 16 (03/10 1122) BP: (144-187)/(53-87) 158/70 (03/10 1227) SpO2:  [98 %-100 %] 100 % (03/10 1227) Weight:  [69.6 kg-71.7 kg] 69.6 kg (03/10 1122) Last BM Date: 09/12/20 General:    Elderly AA male  in NAD uncomfortable and ill-appearing Heart:  Regular rate and rhythm; no murmurs Lungs: Respirations even and unlabored, lungs CTA bilaterally Abdomen:  Soft, diffusely tender nondistended, no rebound  bowel sounds are present Extremities:  Without edema. Neurologic:  Alert and oriented,  grossly normal neurologically. Psych:  Cooperative. Normal mood and affect.  Lab Results: Recent Labs    09/11/20 0845 09/12/20 0352 09/13/20 0337  WBC 15.5* 12.6* 16.4*  HGB 7.4* 7.3* 7.5*  HCT 22.8* 24.6* 24.8*  PLT 263 257 274   BMET Recent Labs    09/11/20 0432 09/11/20 0719 09/13/20 0337  NA QUESTIONABLE RESULTS, RECOMMEND RECOLLECT TO VERIFY 133* 137  K QUESTIONABLE RESULTS, RECOMMEND RECOLLECT TO VERIFY 4.3 4.3  CL QUESTIONABLE RESULTS, RECOMMEND RECOLLECT TO VERIFY 100 101  CO2 QUESTIONABLE RESULTS, RECOMMEND RECOLLECT TO VERIFY 24 28  GLUCOSE QUESTIONABLE RESULTS, RECOMMEND RECOLLECT TO VERIFY 115* 93  BUN QUESTIONABLE RESULTS, RECOMMEND RECOLLECT TO VERIFY 49* 40*  CREATININE  QUESTIONABLE RESULTS, RECOMMEND RECOLLECT TO VERIFY 5.38* 4.30*  CALCIUM QUESTIONABLE RESULTS, RECOMMEND RECOLLECT TO VERIFY 9.5 9.9   LFT Recent Labs    09/13/20 0337  PROT 5.2*  ALBUMIN 2.0*  AST 57*  ALT 103*  ALKPHOS 122  BILITOT 0.3   PT/INR No results for input(s): LABPROT, INR in the last 72 hours.  Studies/Results: CT ABDOMEN PELVIS W CONTRAST  Result Date: 09/11/2020 CLINICAL DATA:  Infectious gastroenteritis or colitis. EXAM: CT ABDOMEN AND PELVIS WITH CONTRAST TECHNIQUE: Multidetector CT imaging of the abdomen and pelvis was performed using the standard protocol following bolus administration of intravenous contrast. CONTRAST:  139mL OMNIPAQUE IOHEXOL 300 MG/ML  SOLN COMPARISON:  September 05, 2020 FINDINGS: Lower chest: There is a trace left-sided pleural effusion with adjacent airspace disease suggestive of atelectasis. There are bilateral calcified pleural based plaques. Coronary artery disease is noted. Hepatobiliary: The liver is normal. There is gallbladder sludge without CT evidence for acute cholecystitis.There is no biliary ductal dilation. Pancreas: Normal contours without ductal dilatation. No peripancreatic fluid collection. Spleen: Unremarkable. Adrenals/Urinary Tract: --Adrenal glands: Unremarkable. --Right kidney/ureter: Right-sided complex and simple renal cysts are again noted. --Left kidney/ureter: A left-sided complex and simple renal cysts are again noted. --Urinary bladder: Unremarkable. Stomach/Bowel: --Stomach/Duodenum: No hiatal hernia or other gastric abnormality. Normal duodenal course and caliber. --Small bowel: Unremarkable. --Colon: There is diffuse circumferential wall thickening of virtually all of the colon. There are scattered colonic diverticula. --Appendix: Normal. Vascular/Lymphatic: Atherosclerotic calcification is present within the non-aneurysmal abdominal aorta, without hemodynamically significant  stenosis. --No retroperitoneal lymphadenopathy. --No  mesenteric lymphadenopathy. --No pelvic or inguinal lymphadenopathy. Reproductive: The prostate gland is enlarged. Other: No ascites or free air. The abdominal wall is normal. Musculoskeletal. There is mild left femoral head avascular necrosis. There is no acute osseous abnormality. IMPRESSION: 1. Diffuse circumferential wall thickening of virtually all of the colon, consistent with infectious or inflammatory pancolitis. 2. Trace left-sided pleural effusion with adjacent airspace disease suggestive of atelectasis. 3. Bilateral calcified pleural based plaques, consistent with prior asbestos exposure. 4. Mild left femoral head avascular necrosis. 5. Additional chronic findings are noted, not substantially changed from prior study. Aortic Atherosclerosis (ICD10-I70.0). Electronically Signed   By: Constance Holster M.D.   On: 09/11/2020 15:43       Assessment / Plan:    #39 75 year old African-American male with end-stage renal disease on dialysis with C. difficile colitis and ischemic colitis  Does not appear to be making any progress, WBC creeping back up.  We will complete the 10-day course of Dificid on Saturday.   He is certainly at increased risk for perforation with instrumentation no follow-up sigmoidoscopy may need to be done to reassess.  Occluded IMA on CT angio  Plan; complete 10-day course of Dificid Continue TPN Repeat C. difficile toxin after completes course of Dificid Unfortunately his colon does not appear to be healing and will likely require colectomy    LOS: 20 days   Amy Esterwood PA-C 09/13/2020, 12:50 PM  ________________________________________________________________________  [facility] MD note:  I personally examined the patient, reviewed the data and agree with the assessment and plan described above. Seems to be heading towards what would probably be a subtotal colectomy.  Hopefully that can still be avoided but his is really not improving.   Owens Loffler,  MD [facility] Gastroenter[facility] Pager [phone]

## 2020-09-13 NOTE — Progress Notes (Signed)
PT Cancellation Note  Patient Details Name: Jason HASE Sr. MRN: [id number] DOB: 07/21/1945   Cancelled Treatment:    Reason Eval/Treat Not Completed: Patient at procedure or test/unavailable - at HD, PT to check back as schedule allows.  Stacie Glaze, PT Acute Rehabilitation Services Pager [phone]  Office [phone]    Louis Matte 09/13/2020, 8:56 AM

## 2020-09-13 NOTE — Progress Notes (Addendum)
[facility] Progress Note   Subjective:Seen on HD. No issues with treatment. No C/Os.     Objective Vitals:   09/13/20 0900 09/13/20 0930 09/13/20 1000 09/13/20 1030  BP: (!) 167/80 (!) 160/87 (!) 174/71 (!) 171/70  Pulse: 93 88 95 92  Resp:      Temp:      TempSrc:      SpO2:      Weight:      Height:       Physical Exam General: Chronically ill appearing elderly male in NAD Heart: S1,S2 RRR No M/R/G Lungs: CTAB. No WOB.  Abdomen: flat, soft with tenderness upper quadrants into epigastric area.  Extremities: Trace R ankle edema.  Dialysis Access: L AVF blood lines connected.    Additional Objective Labs: Basic Metabolic Panel: Recent Labs  Lab 09/09/20 0344 09/10/20 0330 09/11/20 0432 09/11/20 0719 09/13/20 0337  NA 136 133* QUESTIONABLE RESULTS, RECOMMEND RECOLLECT TO VERIFY 133* 137  K 3.5 4.0 QUESTIONABLE RESULTS, RECOMMEND RECOLLECT TO VERIFY 4.3 4.3  CL 101 98 QUESTIONABLE RESULTS, RECOMMEND RECOLLECT TO VERIFY 100 101  CO2 28 25 QUESTIONABLE RESULTS, RECOMMEND RECOLLECT TO VERIFY 24 28  GLUCOSE 108* 118* QUESTIONABLE RESULTS, RECOMMEND RECOLLECT TO VERIFY 115* 93  BUN 22 35* QUESTIONABLE RESULTS, RECOMMEND RECOLLECT TO VERIFY 49* 40*  CREATININE 3.17* 4.62* QUESTIONABLE RESULTS, RECOMMEND RECOLLECT TO VERIFY 5.38* 4.30*  CALCIUM 9.2 10.0 QUESTIONABLE RESULTS, RECOMMEND RECOLLECT TO VERIFY 9.5 9.9  PHOS 2.1* 2.8  --   --  2.6   Liver Function Tests: Recent Labs  Lab 09/08/20 0133 09/10/20 0330 09/13/20 0337  AST 24 38 57*  ALT 14 49* 103*  ALKPHOS 83 93 122  BILITOT 0.3 0.6 0.3  PROT 5.3* 5.4* 5.2*  ALBUMIN 1.9* 2.1* 2.0*   No results for input(s): LIPASE, AMYLASE in the last 168 hours. CBC: Recent Labs  Lab 09/09/20 0344 09/10/20 0330 09/11/20 0845 09/12/20 0352 09/13/20 0337  WBC 12.2* 16.9* 15.5* 12.6* 16.4*  NEUTROABS 9.0* 13.7* 13.4* 10.0* 13.9*  HGB 7.3* 7.9* 7.4* 7.3* 7.5*  HCT 22.8* 25.0* 22.8* 24.6* 24.8*  MCV  88.7 89.6 89.8 94.6 95.8  PLT 227 242 263 257 274   Blood Culture    Component Value Date/Time   SDES IN/OUT CATH URINE 08/17/2020 1234   SPECREQUEST  08/17/2020 1234    NONE Performed at [facility] [address] [place], [place] [address]    CULT 4,000 COLONIES/mL ENTEROCOCCUS GALLINARUM (A) 08/17/2020 1234   REPTSTATUS 08/21/2020 FINAL 08/17/2020 1234    Cardiac Enzymes: No results for input(s): CKTOTAL, CKMB, CKMBINDEX, TROPONINI in the last 168 hours. CBG: Recent Labs  Lab 09/12/20 1638 09/12/20 2011 09/12/20 2358 09/13/20 0351 09/13/20 0732  GLUCAP 105* 107* 107* 109* 112*   Iron Studies: No results for input(s): IRON, TIBC, TRANSFERRIN, FERRITIN in the last 72 hours. @lablastinr3 @ Studies/Results: CT ABDOMEN PELVIS W CONTRAST  Result Date: 09/11/2020 CLINICAL DATA:  Infectious gastroenteritis or colitis. EXAM: CT ABDOMEN AND PELVIS WITH CONTRAST TECHNIQUE: Multidetector CT imaging of the abdomen and pelvis was performed using the standard protocol following bolus administration of intravenous contrast. CONTRAST:  187mL OMNIPAQUE IOHEXOL 300 MG/ML  SOLN COMPARISON:  September 05, 2020 FINDINGS: Lower chest: There is a trace left-sided pleural effusion with adjacent airspace disease suggestive of atelectasis. There are bilateral calcified pleural based plaques. Coronary artery disease is noted. Hepatobiliary: The liver is normal. There is gallbladder sludge without CT evidence for acute cholecystitis.There is no biliary ductal  dilation. Pancreas: Normal contours without ductal dilatation. No peripancreatic fluid collection. Spleen: Unremarkable. Adrenals/Urinary Tract: --Adrenal glands: Unremarkable. --Right kidney/ureter: Right-sided complex and simple renal cysts are again noted. --Left kidney/ureter: A left-sided complex and simple renal cysts are again noted. --Urinary bladder: Unremarkable. Stomach/Bowel: --Stomach/Duodenum: No hiatal hernia or other gastric abnormality.  Normal duodenal course and caliber. --Small bowel: Unremarkable. --Colon: There is diffuse circumferential wall thickening of virtually all of the colon. There are scattered colonic diverticula. --Appendix: Normal. Vascular/Lymphatic: Atherosclerotic calcification is present within the non-aneurysmal abdominal aorta, without hemodynamically significant stenosis. --No retroperitoneal lymphadenopathy. --No mesenteric lymphadenopathy. --No pelvic or inguinal lymphadenopathy. Reproductive: The prostate gland is enlarged. Other: No ascites or free air. The abdominal wall is normal. Musculoskeletal. There is mild left femoral head avascular necrosis. There is no acute osseous abnormality. IMPRESSION: 1. Diffuse circumferential wall thickening of virtually all of the colon, consistent with infectious or inflammatory pancolitis. 2. Trace left-sided pleural effusion with adjacent airspace disease suggestive of atelectasis. 3. Bilateral calcified pleural based plaques, consistent with prior asbestos exposure. 4. Mild left femoral head avascular necrosis. 5. Additional chronic findings are noted, not substantially changed from prior study. Aortic Atherosclerosis (ICD10-I70.0). Electronically Signed   By: Constance Holster M.D.   On: 09/11/2020 15:43   Medications: . TPN ADULT (ION) 70 mL/hr at 09/12/20 2349  . TPN ADULT (ION)     . sodium chloride   Intravenous Once  . Chlorhexidine Gluconate Cloth  6 each Topical Daily  . darbepoetin (ARANESP) injection - DIALYSIS  200 mcg Intravenous Q Sat-HD  . fidaxomicin  200 mg Oral BID  . HYDROmorphone      . niacin  250 mg Oral BID WC  . pantoprazole  40 mg Oral BID  . sodium chloride flush  10-40 mL Intracatheter Q12H     OP HD: [facility] TTS  4h 450/500 58kg 3K/2Ca P2 L AVF  -No Heparin -Mircera 50 mcg IV q 2 weeks (to start 2/22)  -Venofer 50mg  IV weekly -Hectorol 7 mcg IV TIW   Assessment/Plan 1. C. Diff colitis - on po Vanc/Florastor/ dificid.  Having persistent abd pain and diarrhea. Flex sig 2/26 showed severely ulcerated mucosa of the sigmoid colon/ rectum, possibly radiation changes vs ischemia vs infection. Per primary/GI/CCS.  2. GI bleed/ ABL anemia: w/ supra therapeutic INR on admit. SP EGD2/9with duodenal ulcer, treated w/ clip+ injection. On Protonix. S/P 3 units of PRBCs since admission. GI following.   3. Nutrition - is now getting IV TPN  4. ESRD - HD TTS.Next HD 09/13/20. No heparin . 5. AFib- on coumadin. Supratherapeutic INR on admission.Reversed. On hold, would not restart.  6. HTN/Volume -No BP meds. He is losing body weight. Under dry. gettingcontinuous TPNso watch volume closely. Na down to 133. Net UF with HD 09/799 liter. Increase UF and attempt to lower volume as tolerated with HD tomorrow. .  7. Anemia CKD --see #1. HGB 7.5 today. Continue aranesp. Recently increased  increased dose to 200 mcg IV q Saturday. Transfuse PRN.  8. MBD CKD --Corr Ca^.Holdinghectorol, Phos 2.1, no binders,on TPN 9. H/O COPD  10. H/O prior rectal cancer - in 2014 treated w/ chemoRx,radiation, laparoscopic low anterior resection with diverting loop ileostomy and subsequent ileostomy takedown. 11. GOC - seen by palliative care, pt is DNR. Poor prognosis. Would recommend transition to Hospice, comfort care.   Mayo Owczarzak H. Michaelyn Wall NP-C 09/13/2020, 11:09 AM  [employer] [phone]

## 2020-09-14 DIAGNOSIS — K2970 Gastritis, unspecified, without bleeding: Secondary | ICD-10-CM | POA: Diagnosis not present

## 2020-09-14 DIAGNOSIS — Z515 Encounter for palliative care: Secondary | ICD-10-CM | POA: Diagnosis not present

## 2020-09-14 DIAGNOSIS — R791 Abnormal coagulation profile: Secondary | ICD-10-CM

## 2020-09-14 DIAGNOSIS — Z7189 Other specified counseling: Secondary | ICD-10-CM | POA: Diagnosis not present

## 2020-09-14 DIAGNOSIS — N186 End stage renal disease: Secondary | ICD-10-CM

## 2020-09-14 DIAGNOSIS — R140 Abdominal distension (gaseous): Secondary | ICD-10-CM | POA: Diagnosis not present

## 2020-09-14 DIAGNOSIS — A0472 Enterocolitis due to Clostridium difficile, not specified as recurrent: Secondary | ICD-10-CM | POA: Diagnosis not present

## 2020-09-14 DIAGNOSIS — K633 Ulcer of intestine: Secondary | ICD-10-CM | POA: Diagnosis not present

## 2020-09-14 LAB — CBC WITH DIFFERENTIAL/PLATELET
Abs Immature Granulocytes: 0.08 10*3/uL — ABNORMAL HIGH (ref 0.00–0.07)
Basophils Absolute: 0.1 10*3/uL (ref 0.0–0.1)
Basophils Relative: 1 %
Eosinophils Absolute: 0.1 10*3/uL (ref 0.0–0.5)
Eosinophils Relative: 1 %
HCT: 25.9 % — ABNORMAL LOW (ref 39.0–52.0)
Hemoglobin: 7.9 g/dL — ABNORMAL LOW (ref 13.0–17.0)
Immature Granulocytes: 1 %
Lymphocytes Relative: 8 %
Lymphs Abs: 1 10*3/uL (ref 0.7–4.0)
MCH: 29 pg (ref 26.0–34.0)
MCHC: 30.5 g/dL (ref 30.0–36.0)
MCV: 95.2 fL (ref 80.0–100.0)
Monocytes Absolute: 1.3 10*3/uL — ABNORMAL HIGH (ref 0.1–1.0)
Monocytes Relative: 10 %
Neutro Abs: 11 10*3/uL — ABNORMAL HIGH (ref 1.7–7.7)
Neutrophils Relative %: 79 %
Platelets: 220 10*3/uL (ref 150–400)
RBC: 2.72 MIL/uL — ABNORMAL LOW (ref 4.22–5.81)
RDW: 25.6 % — ABNORMAL HIGH (ref 11.5–15.5)
WBC: 13.6 10*3/uL — ABNORMAL HIGH (ref 4.0–10.5)
nRBC: 0 % (ref 0.0–0.2)

## 2020-09-14 LAB — GLUCOSE, CAPILLARY
Glucose-Capillary: 112 mg/dL — ABNORMAL HIGH (ref 70–99)
Glucose-Capillary: 108 mg/dL — ABNORMAL HIGH (ref 70–99)
Glucose-Capillary: 120 mg/dL — ABNORMAL HIGH (ref 70–99)

## 2020-09-14 MED ORDER — TRACE MINERALS CU-MN-SE-ZN 300-55-60-3000 MCG/ML IV SOLN
INTRAVENOUS | Status: DC
  Filled 2020-09-14: qty 728

## 2020-09-14 MED ORDER — BIOTENE DRY MOUTH MT LIQD: 15.0000 mL | OROMUCOSAL | Status: DC | PRN

## 2020-09-14 MED ORDER — POLYVINYL ALCOHOL 1.4 % OP SOLN
2.0000 [drp] | OPHTHALMIC | Status: DC | PRN
  Filled 2020-09-14: qty 15

## 2020-09-14 MED ORDER — GLYCOPYRROLATE 0.2 MG/ML IJ SOLN: 0.4000 mg | INTRAMUSCULAR | Status: DC | PRN

## 2020-09-14 MED ORDER — LORAZEPAM 2 MG/ML IJ SOLN
0.5000 mg | INTRAMUSCULAR | Status: DC | PRN
  Administered 2020-09-14 – 2020-09-15 (×2): 0.5 mg via INTRAVENOUS
  Filled 2020-09-14 (×2): qty 1

## 2020-09-14 MED ORDER — HYDROMORPHONE HCL 1 MG/ML IJ SOLN
0.5000 mg | INTRAMUSCULAR | Status: DC | PRN
Start: 2020-09-14 — End: 2020-09-16
  Administered 2020-09-14 – 2020-09-16 (×7): 1 mg via INTRAVENOUS
  Filled 2020-09-14 (×7): qty 1

## 2020-09-14 MED ORDER — BISACODYL 10 MG RE SUPP: 10.0000 mg | Freq: Every day | RECTAL | Status: DC | PRN

## 2020-09-14 NOTE — Progress Notes (Signed)
Occupational Therapy Treatment Patient Details Name: Jason CARLILE Sr. MRN: [id number] DOB: July 19, 1945 Today's Date: 09/14/2020    History of present illness Pt is a 75 y.o. male recently admitted 08/15/20-08/19/20 with upper GIB and duodenal ulcer, now readmitted 08/24/20 with C. difficile collitis, lower GIB. Transfer 4E to 6N on 3/4. PMH includes afib on Coumadin, peptic ulcer disease, rectal CA, ESRD (HD TTS), CVA, gout, COPD, s/p partial lung lobectomy (2017).   OT comments  Pt progressing towards OT goals, motivated to walk today. Pt overall min guard for bed mobility, min guard for short distance mobility using RW in room. Pt endorsed some lightheadedness in standing that did not progress with activity. Pt continues to require Max A for LB ADLs sitting EOB due to difficulty reaching feet and overall deconditioning. Pt left in recliner chair, repositioned for comfort with alarm active. Collaborated with nursing staff for ordering of geomat to optimal skin integrity (pillow under pt at this time).    Follow Up Recommendations  SNF    Equipment Recommendations  3 in 1 bedside commode    Recommendations for Other Services      Precautions / Restrictions Precautions Precautions: Fall;Other (comment) Precaution Comments: WBAT in RLE in CAM boot since last admission, enteric Required Braces or Orthoses: Other Brace Other Brace: CAM walker boot Restrictions Weight Bearing Restrictions: Yes RLE Weight Bearing: Weight bearing as tolerated Other Position/Activity Restrictions: WBAT in CAM boot       Mobility Bed Mobility Overal bed mobility: Needs Assistance Bed Mobility: Rolling;Sidelying to Sit Rolling: Modified independent (Device/Increase time) Sidelying to sit: Min guard       General bed mobility comments: min guard with increased time to sit EOB    Transfers Overall transfer level: Needs assistance Equipment used: Rolling walker (2 wheeled) Transfers: Sit to/from  Stand Sit to Stand: Min guard         General transfer comment: min guard for sit to stand, appropriate hand placement noted. min guard for short mobility in room via RW, reports fatigue and need for rest break    Balance Overall balance assessment: Needs assistance Sitting-balance support: Feet supported;No upper extremity supported Sitting balance-Leahy Scale: Good     Standing balance support: Bilateral upper extremity supported;During functional activity Standing balance-Leahy Scale: Poor Standing balance comment: reliant on BUE support                           ADL either performed or assessed with clinical judgement   ADL Overall ADL's : Needs assistance/impaired               Lower Body Bathing Details (indicate cue type and reason): Pt reports itchy feet, assisted in washing, drying and applying lotion to B feet     Lower Body Dressing: Maximal assistance;Sitting/lateral leans;Sit to/from stand Lower Body Dressing Details (indicate cue type and reason): Max A for donning socks sitting EOB and cam boot mgmt. Pt able to cross R LE to assist with task but difficulty crossing L LE due to knee arthritis             Functional mobility during ADLs: Min guard;Rolling walker;Cueing for sequencing;Cueing for safety General ADL Comments: Improving participation, able to demo short mobility in room (motivated to walk today). Continues to require increased assist for LB ADLs due to deconditioning and difficulty reaching     Vision   Vision Assessment?: No apparent visual deficits   Perception  Praxis      Cognition Arousal/Alertness: Awake/alert Behavior During Therapy: Flat affect;WFL for tasks assessed/performed Overall Cognitive Status: Impaired/Different from baseline Area of Impairment: Safety/judgement;Problem solving;Awareness                         Safety/Judgement: Decreased awareness of deficits Awareness: Emergent Problem  Solving: Slow processing;Difficulty sequencing;Requires verbal cues          Exercises     Shoulder Instructions       General Comments Small pressure wound on bottom, RN applied bandage while pt sidelying. Placed pillow in chair with collab with nursing for ordering of geomat as none in unit closets to maximize skin integrity. Pt reports lightheadedness in standing that did not progress with activity    Pertinent Vitals/ Pain       Pain Assessment: Faces Faces Pain Scale: Hurts a little bit Pain Location: abdomen Pain Descriptors / Indicators: Grimacing;Sore Pain Intervention(s): Monitored during session;RN gave pain meds during session;Repositioned;Limited activity within patient's tolerance  Home Living                                          Prior Functioning/Environment              Frequency  Min 2X/week        Progress Toward Goals  OT Goals(current goals can now be found in the care plan section)  Progress towards OT goals: Progressing toward goals  Acute Rehab OT Goals Patient Stated Goal: pain relief OT Goal Formulation: With patient Time For Goal Achievement: 09/26/20 Potential to Achieve Goals: Fair ADL Goals Pt Will Perform Grooming: with min guard assist;standing Pt Will Perform Upper Body Bathing: with supervision;with set-up;sitting Pt Will Perform Lower Body Bathing: with mod assist;sitting/lateral leans;sit to/from stand Pt Will Perform Upper Body Dressing: with supervision;with set-up;sitting Pt Will Transfer to Toilet: with min guard assist;with supervision;ambulating;regular height toilet;bedside commode Pt Will Perform Toileting - Clothing Manipulation and hygiene: with min assist;sit to/from stand  Plan Discharge plan remains appropriate    Co-evaluation                 AM-PAC OT "6 Clicks" Daily Activity     Outcome Measure   Help from another person eating meals?: None Help from another person taking care  of personal grooming?: A Little Help from another person toileting, which includes using toliet, bedpan, or urinal?: A Lot Help from another person bathing (including washing, rinsing, drying)?: A Lot Help from another person to put on and taking off regular upper body clothing?: A Little Help from another person to put on and taking off regular lower body clothing?: A Lot 6 Click Score: 16    End of Session Equipment Utilized During Treatment: Rolling walker  OT Visit Diagnosis: Unsteadiness on feet (R26.81);Other abnormalities of gait and mobility (R26.89);Muscle weakness (generalized) (M62.81);Pain;Dizziness and giddiness (R42) Pain - part of body:  (abdomen)   Activity Tolerance Patient tolerated treatment well   Patient Left in chair;with call bell/phone within reach;with chair alarm set   Nurse Communication Mobility status;Other (comment) (skin integrity)        Time: 9811-9147 OT Time Calculation (min): 34 min  Charges: OT General Charges $OT Visit: 1 Visit OT Treatments $Self Care/Home Management : 8-22 mins $Therapeutic Activity: 8-22 mins  Malachy Chamber, OTR/L Acute Rehab Services Office: [phone]  Jason [name] 09/14/2020, 9:24 AM

## 2020-09-14 NOTE — Progress Notes (Signed)
Manufacturing engineer [facility]) Hospital Liaison note.    Received request from [place] for family interest in [facility]. Chart and pt information under review by [facility] physician.  Hospice eligibility pending at this time.  [facility] is unable to offer a room today. Hospital Liaison will follow up tomorrow or sooner if a room becomes available. Please do not hesitate to call with questions.    Thank you for the opportunity to participate in this patient's care.  Domenic Moras, BSN, RN [facility] Liaison (listed on [place] under Hospice/[facility])    [phone]  (24h on call)

## 2020-09-14 NOTE — Progress Notes (Signed)
PT Cancellation Note  Patient Details Name: Jason SCHULD Sr. MRN: [id number] DOB: 1946/04/22   Cancelled Treatment:      Pt comfort care, PT orders cancelled.   Abran Richard, PT Acute Rehab Services Pager [phone] [facility] Rehab [phone]    Karlton Lemon 09/14/2020, 11:25 AM

## 2020-09-14 NOTE — Progress Notes (Addendum)
[facility] Progress Note   Subjective: Completed dialysis yesterday 2L UF - no issues Seen in room. Family at bedside. No new issues -some abdominal discomfort.   Objective Vitals:   09/13/20 1138 09/13/20 1227 09/13/20 2044 09/14/20 0431  BP: (!) 187/82 (!) 158/70 (!) 167/79 (!) 168/86  Pulse: 85 95 98 95  Resp:   17 18  Temp:  97.9 F (36.6 C) 98.6 F (37 C) 98.6 F (37 C)  TempSrc:  Oral Oral Oral  SpO2:  100% 100% 100%  Weight:      Height:       Physical Exam General: Chronically ill appearing elderly male in NAD Heart: S1,S2 RRR No M/R/G Lungs: CTAB. No WOB.  Abdomen: flat, soft with tenderness upper quadrants into epigastric area.  Extremities: Trace R ankle edema.  Dialysis Access: L AVF +bruit    Additional Objective Labs: Basic Metabolic Panel: Recent Labs  Lab 09/09/20 0344 09/10/20 0330 09/11/20 0432 09/11/20 0719 09/13/20 0337  NA 136 133* QUESTIONABLE RESULTS, RECOMMEND RECOLLECT TO VERIFY 133* 137  K 3.5 4.0 QUESTIONABLE RESULTS, RECOMMEND RECOLLECT TO VERIFY 4.3 4.3  CL 101 98 QUESTIONABLE RESULTS, RECOMMEND RECOLLECT TO VERIFY 100 101  CO2 28 25 QUESTIONABLE RESULTS, RECOMMEND RECOLLECT TO VERIFY 24 28  GLUCOSE 108* 118* QUESTIONABLE RESULTS, RECOMMEND RECOLLECT TO VERIFY 115* 93  BUN 22 35* QUESTIONABLE RESULTS, RECOMMEND RECOLLECT TO VERIFY 49* 40*  CREATININE 3.17* 4.62* QUESTIONABLE RESULTS, RECOMMEND RECOLLECT TO VERIFY 5.38* 4.30*  CALCIUM 9.2 10.0 QUESTIONABLE RESULTS, RECOMMEND RECOLLECT TO VERIFY 9.5 9.9  PHOS 2.1* 2.8  --   --  2.6   Liver Function Tests: Recent Labs  Lab 09/08/20 0133 09/10/20 0330 09/13/20 0337  AST 24 38 57*  ALT 14 49* 103*  ALKPHOS 83 93 122  BILITOT 0.3 0.6 0.3  PROT 5.3* 5.4* 5.2*  ALBUMIN 1.9* 2.1* 2.0*   No results for input(s): LIPASE, AMYLASE in the last 168 hours. CBC: Recent Labs  Lab 09/10/20 0330 09/11/20 0845 09/12/20 0352 09/13/20 0337 09/14/20 0342  WBC 16.9* 15.5*  12.6* 16.4* 13.6*  NEUTROABS 13.7* 13.4* 10.0* 13.9* 11.0*  HGB 7.9* 7.4* 7.3* 7.5* 7.9*  HCT 25.0* 22.8* 24.6* 24.8* 25.9*  MCV 89.6 89.8 94.6 95.8 95.2  PLT 242 263 257 274 220   Blood Culture    Component Value Date/Time   SDES IN/OUT CATH URINE 08/17/2020 1234   SPECREQUEST  08/17/2020 1234    NONE Performed at [facility] [address] [place], [place] [address]    CULT 4,000 COLONIES/mL ENTEROCOCCUS GALLINARUM (A) 08/17/2020 1234   REPTSTATUS 08/21/2020 FINAL 08/17/2020 1234    Cardiac Enzymes: No results for input(s): CKTOTAL, CKMB, CKMBINDEX, TROPONINI in the last 168 hours. CBG: Recent Labs  Lab 09/13/20 1549 09/13/20 2035 09/13/20 2339 09/14/20 0428 09/14/20 0728  GLUCAP 103* 111* 117* 112* 108*   Iron Studies: No results for input(s): IRON, TIBC, TRANSFERRIN, FERRITIN in the last 72 hours. @lablastinr3 @ Studies/Results: No results found. Medications: . TPN ADULT (ION) 70 mL/hr at 09/14/20 0326  . TPN ADULT (ION)     . sodium chloride   Intravenous Once  . Chlorhexidine Gluconate Cloth  6 each Topical Daily  . darbepoetin (ARANESP) injection - DIALYSIS  200 mcg Intravenous Q Sat-HD  . fidaxomicin  200 mg Oral BID  . niacin  250 mg Oral BID WC  . pantoprazole  40 mg Oral BID  . sodium chloride flush  10-40 mL Intracatheter Q12H  OP HD: [facility] TTS  4h 450/500 58kg 3K/2Ca P2 L AVF  -No Heparin -Mircera 50 mcg IV q 2 weeks (to start 2/22)  -Venofer 50mg  IV weekly -Hectorol 7 mcg IV TIW   Assessment/Plan 1. C. Diff colitis - on po Vanc/Florastor/ dificid. Having persistent abd pain and diarrhea. Flex sig 2/26 showed severely ulcerated mucosa of the sigmoid colon/ rectum, possibly radiation changes vs ischemia vs infection. Per primary/GI/CCS.  2. GI bleed/ ABL anemia: w/ supra therapeutic INR on admit. SP EGD2/9with duodenal ulcer, treated w/ clip+ injection. On Protonix. S/P 3 units of PRBCs since admission. GI  following.   3. Nutrition - is now getting IV TPN  4. ESRD - HD TTS. No heparin. Will hold orders for dialysis tomorrow. Appears plans for transition to comfort care.  5. AFib- on coumadin. Supratherapeutic INR on admission.Reversed. On hold, would not restart.  6. HTN/Volume -No BP meds. He is losing body weight. Under dry. Gettingcontinuous TPNso watch volume closely.  7. Anemia CKD --see #1. Hb 7.9.  Continue aranesp. Recently increased  increased dose to 200 mcg IV q Saturday. Transfuse PRN.  8. MBD CKD --Corr Ca^.Holdinghectorol, Phos 2.6, no binders,on TPN 9. H/O COPD  10. H/O prior rectal cancer - in 2014 treated w/ chemoRx,radiation, laparoscopic low anterior resection with diverting loop ileostomy and subsequent ileostomy takedown. 11. GOC - seen by palliative care, pt is DNR. Poor prognosis. Would recommend transition to Hospice, comfort care.   Lynnda Child PA-C [facility] 09/14/2020,10:12 AM

## 2020-09-14 NOTE — Progress Notes (Signed)
Progress Note  13 Days Post-Op  Subjective: Patient reports abdominal pain and nausea are the same. He does not appear to be making any significant improvement. He had 4 BM documented in the last 24 hrs. We discussed again what surgery for this would entail and that ileostomy would likely be permanent. He at this time does not feel like he would want to undergo surgery.   Objective: Vital signs in last 24 hours: Temp:  [97.9 F (36.6 C)-98.6 F (37 C)] 98.6 F (37 C) (03/11 0431) Pulse Rate:  [85-98] 95 (03/11 0431) Resp:  [16-18] 18 (03/11 0431) BP: (147-187)/(57-87) 168/86 (03/11 0431) SpO2:  [100 %] 100 % (03/11 0431) Weight:  [69.6 kg] 69.6 kg (03/10 1122) Last BM Date: 09/13/20  Intake/Output from previous day: 03/10 0701 - 03/11 0700 In: 1062.1 [P.O.:480; I.V.:582.1] Out: 2550 [Urine:550] Intake/Output this shift: No intake/output data recorded.  PE: General: pleasant, WD,cachecticmale who is laying inbedin NAD Heart: regular, rate, and rhythm.  Lungs: CTAB, no wheezes, rhonchi, or rales noted. Respiratory effort nonlabored Abd: soft,mild generalized ttp without peritonitis or guarding, ND, +BS, no masses, hernias, or organomegaly MS: all 4 extremities are symmetrical with no cyanosis, clubbing, or edema. Skin: warm and dry with no masses, lesions, or rashes Psych: A&Ox3 with an appropriate affect.   Lab Results:  Recent Labs    09/13/20 0337 09/14/20 0342  WBC 16.4* 13.6*  HGB 7.5* 7.9*  HCT 24.8* 25.9*  PLT 274 220   BMET Recent Labs    09/13/20 0337  NA 137  K 4.3  CL 101  CO2 28  GLUCOSE 93  BUN 40*  CREATININE 4.30*  CALCIUM 9.9   PT/INR No results for input(s): LABPROT, INR in the last 72 hours. CMP     Component Value Date/Time   NA 137 09/13/2020 0337   NA 140 02/24/2017 0000   NA 138 02/20/2014 1432   K 4.3 09/13/2020 0337   K 3.5 02/20/2014 1432   CL 101 09/13/2020 0337   CL 101 12/09/2012 1048   CO2 28 09/13/2020 0337    CO2 30 (H) 02/20/2014 1432   GLUCOSE 93 09/13/2020 0337   GLUCOSE 93 02/20/2014 1432   GLUCOSE 96 12/09/2012 1048   BUN 40 (H) 09/13/2020 0337   BUN 50 (A) 08/10/2017 0000   BUN 36.0 (H) 02/20/2014 1432   CREATININE 4.30 (H) 09/13/2020 0337   CREATININE 2.4 (H) 02/20/2014 1432   CALCIUM 9.9 09/13/2020 0337   CALCIUM 10.4 02/20/2014 1432   PROT 5.2 (L) 09/13/2020 0337   PROT 7.2 02/20/2014 1432   ALBUMIN 2.0 (L) 09/13/2020 0337   ALBUMIN 3.8 02/20/2014 1432   AST 57 (H) 09/13/2020 0337   AST 22 02/20/2014 1432   ALT 103 (H) 09/13/2020 0337   ALT 12 02/20/2014 1432   ALKPHOS 122 09/13/2020 0337   ALKPHOS 86 02/20/2014 1432   BILITOT 0.3 09/13/2020 0337   BILITOT 0.32 02/20/2014 1432   GFRNONAA 14 (L) 09/13/2020 0337   GFRAA 8 (L) 04/08/2019 1702   Lipase     Component Value Date/Time   LIPASE 47 08/15/2020 0659       Studies/Results: No results found.  Anti-infectives: Anti-infectives (From admission, onward)   Start     Dose/Rate Route Frequency Ordered Stop   09/06/20 2200  fidaxomicin (DIFICID) tablet 200 mg        200 mg Oral 2 times daily 09/06/20 1530 09/16/20 2159   09/06/20 0930  vancomycin (VANCOCIN) 50  mg/mL oral solution 500 mg  Status:  Discontinued        500 mg Oral Every 6 hours 09/06/20 0830 09/06/20 1530   09/01/20 1600  metroNIDAZOLE (FLAGYL) IVPB 500 mg  Status:  Discontinued        500 mg 100 mL/hr over 60 Minutes Intravenous Every 12 hours 09/01/20 1315 09/05/20 1430   08/26/20 1000  vancomycin (VANCOCIN) 50 mg/mL oral solution 500 mg        500 mg Oral 4 times daily 08/26/20 0740 09/03/20 2159   08/24/20 2100  vancomycin (VANCOCIN) 50 mg/mL oral solution 125 mg  Status:  Discontinued        125 mg Oral 4 times daily 08/24/20 1717 08/26/20 0740       Assessment/Plan Hx of rectal CA s/p LAR and chemo/radiation  HTN Hx of CVA COPD ESRD on HDTTS A. Fibon coumadin - coumadin held Code status DNR/DNI - palliative following,plan is to  continue current treatments for now and family will decide by the end of this week if they would like to continue to pursue aggressive care  C diff colitis,Ischemic colitis -Patient continues to have generalized pain but abdomen is soft without signs of peritonitis. No signs of pneumatosis or portal venous gas on CT, although there is persistent thickening consistent with colitis. Surgical resection would be high risk given prior radiation to the pelvis and prior rectal dissection with coloanal anastomosis. For now recommend continued medical management of colitis. Ensure patient stays adequately hydrated and maintain normotension to maximize perfusion of the colon. - Given persistent pain and CT findings,abx were resumed 3/3 and ordered for 10 days which would end tomorrow  - repeat CT A/P 3/8 shows pancolitis but no megacolon  - patient does not really seem to be improving at this point - we discussed again that surgery at this point would likely mean total abdominal colectomy and permanent end ileostomy. Patient at this time does not feel like he would want surgical intervention. I think it would be beneficial to have palliative meet with patient and family again. If he continues to decline surgery, unfortunately I am not sure there is much else we can do for him.    FEN:CLD, IVF; TPN VTE: heparin with HD ID: PO vanc 2/18>2/28; IV flagyl 1/26>3/2; PO dificid 3/3>>  LOS: 21 days    [name] , [facility] 09/14/2020, 9:12 AM Please see Amion for pager number during day hours 7:00am-4:30pm

## 2020-09-14 NOTE — Progress Notes (Signed)
PROGRESS NOTE  Jason WILLERS Sr. JKD:[id number] DOB: 01/29/1946   PCP: [name], MD  Patient is from: Home  DOA: 08/24/2020 LOS: 99  Chief complaints: Abdominal pain, nausea, vomiting, diarrhea and GI bleed  Brief Narrative / Interim history: 75 year old male with PMH of C. difficile, A. fib on warfarin, PUD, ESRD on HD TTS, rectal cancer s/p resection, chemo and radiation presented to ED with the above chief complaint, and admitted for acute blood loss anemia due to GI bleed in the setting of supratherapeutic INR, and C. difficile colitis.  Gastroenterology and general surgery consulted.  CTA abdomen and pelvis on 2/24 suspicious for ileus.  Patient underwent flex sigmoidoscopy on 09/01/2020 that showed severely ulcerated mucosa of the sigmoid colon and rectum, possibly radiation change versus ischemic versus infection.  He was a started on Dificid.  However, patient continued to have persistent ileus requiring TPN.  General surgery recommended colectomy but patient refused.  Palliative medicine consulted, and patient decided to pursue full comfort care.  Family including wife and patient's son supportive of patient's wish and preference.  Patient will be transferred to [facility] once bed available.  Subjective: Seen and examined earlier this morning.  No major events overnight of this morning.  Reports some abdominal pain that he describes as "mild".  He is sitting in chair.  [name] and son at bedside.  They just had a meeting with palliative care about goals of care as above.   Objective: Vitals:   09/13/20 1227 09/13/20 2044 09/14/20 0431 09/14/20 1142  BP: (!) 158/70 (!) 167/79 (!) 168/86 (!) 155/84  Pulse: 95 98 95 (!) 102  Resp:  17 18 17   Temp: 97.9 F (36.6 C) 98.6 F (37 C) 98.6 F (37 C) 97.7 F (36.5 C)  TempSrc: Oral Oral Oral Oral  SpO2: 100% 100% 100% 100%  Weight:      Height:        Intake/Output Summary (Last 24 hours) at 09/14/2020 1822 Last data  filed at 09/14/2020 1337 Gross per 24 hour  Intake 942.12 ml  Output 350 ml  Net 592.12 ml   Filed Weights   09/13/20 0745 09/13/20 1122  Weight: 71.7 kg 69.6 kg    Examination:  GENERAL: No apparent distress.  Nontoxic. HEENT: MMM.  Vision and hearing grossly intact.  NECK: Supple.  No apparent JVD.  RESP: On RA.  No IWOB.  Fair aeration bilaterally. CVS:  RRR. Heart sounds normal.  ABD/GI/GU: BS+.  Distended abdomen. MSK/EXT:  Moves extremities. No apparent deformity. No edema.  SKIN: no apparent skin lesion or wound NEURO: Awake, alert and oriented appropriately.  No apparent focal neuro deficit. PSYCH: Calm. Normal affect.   Procedures:  2/26-EGD and flex sigmoidoscopy 2/25-colonoscopy  Microbiology summarized: [id number] and influenza PCR nonreactive MRSA PCR screen negative  Assessment & Plan: End-of-life care/comfort measures only/DNR/DNI -Comfort pathway per palliative medicine -Transfer to [facility] when bed available -Emotional support to patient and family  C. difficile colitis-CT abdomen and pelvis suspicious for ileus.  Fleck sigmoidoscopy with severely ulcerated mucosa sigmoid colon and rectum.  P.o. vancomycin 2/18-3/3.  Deficid 3/3-3/11.   Acute blood loss anemia due to lower GI bleed in the setting of supratherapeutic INR: INR was 8.8 on admission.  Reversed with vitamin K.  EGD without active bleed.  Flex sigmoidoscopy with severe radiation changes, proctitis, ischemic versus inflammatory colitis and strictures.  Received 2 units so far.  H&H stable Recent Labs    09/06/20 0524  09/07/20 0046 09/07/20 1647 09/08/20 0133 09/09/20 0344 09/10/20 0330 09/11/20 0845 09/12/20 0352 09/13/20 0337 09/14/20 0342  HGB 7.4* 7.2* 7.6* 7.5* 7.3* 7.9* 7.4* 7.3* 7.5* 7.9*    Persistent ileus/nausea/vomiting/abdominal pain/distention: Remained on TPN.  Surgery recommended colectomy but patient refused and elected to pursue comfort measures only.  History of  rectal cancer status post LAR, XRT and chemotherapy  Chronic A. fib on warfarin Supratherapeutic INR-reversed with vitamin K  ESRD on HD TTS  Hyperglycemia without history of diabetes  Generalized weakness/debility/physical deconditioning  Adult failure to thrive/severe protein calorie malnutrition Body mass index is 23.33 kg/m. Nutrition Problem: Severe Malnutrition Etiology: chronic illness (ESRD, COPD chronic in nature exascerbated by acute GI related illness) Signs/Symptoms: severe fat depletion,severe muscle depletion,moderate muscle depletion,moderate fat depletion,energy intake < 75% for > or equal to 1 month Interventions: TPN   DVT prophylaxis:  None.  Patient is full comfort measures  Code Status: DNR/DNI Family Communication: Updated patient's wife and son at bedside Level of care: Telemetry Medical Status is: Inpatient  Remains inpatient appropriate because:Unsafe d/c plan   Dispo:  Patient From: Home  Planned Disposition: [place]  Medically stable for discharge: No         Consultants:  General surgery Gastroenterology Infectious disease Nephrology Palliative medicine   Sch Meds:  Scheduled Meds:  fidaxomicin  200 mg Oral BID   pantoprazole  40 mg Oral BID   Continuous Infusions: PRN Meds:.acetaminophen **OR** acetaminophen, antiseptic oral rinse, bisacodyl, glycopyrrolate, HYDROmorphone (DILAUDID) injection, hyoscyamine, lip balm, LORazepam, ondansetron (ZOFRAN) IV, polyvinyl alcohol, promethazine  Antimicrobials: Anti-infectives (From admission, onward)   Start     Dose/Rate Route Frequency Ordered Stop   09/06/20 2200  fidaxomicin (DIFICID) tablet 200 mg        200 mg Oral 2 times daily 09/06/20 1530 09/16/20 2159   09/06/20 0930  vancomycin (VANCOCIN) 50 mg/mL oral solution 500 mg  Status:  Discontinued        500 mg Oral Every 6 hours 09/06/20 0830 09/06/20 1530   09/01/20 1600  metroNIDAZOLE (FLAGYL) IVPB 500 mg   Status:  Discontinued        500 mg 100 mL/hr over 60 Minutes Intravenous Every 12 hours 09/01/20 1315 09/05/20 1430   08/26/20 1000  vancomycin (VANCOCIN) 50 mg/mL oral solution 500 mg        500 mg Oral 4 times daily 08/26/20 0740 09/03/20 2159   08/24/20 2100  vancomycin (VANCOCIN) 50 mg/mL oral solution 125 mg  Status:  Discontinued        125 mg Oral 4 times daily 08/24/20 1717 08/26/20 0740       I have personally reviewed the following labs and images: CBC: Recent Labs  Lab 09/10/20 0330 09/11/20 0845 09/12/20 0352 09/13/20 0337 09/14/20 0342  WBC 16.9* 15.5* 12.6* 16.4* 13.6*  NEUTROABS 13.7* 13.4* 10.0* 13.9* 11.0*  HGB 7.9* 7.4* 7.3* 7.5* 7.9*  HCT 25.0* 22.8* 24.6* 24.8* 25.9*  MCV 89.6 89.8 94.6 95.8 95.2  PLT 242 263 257 274 220   BMP &GFR Recent Labs  Lab 09/08/20 0133 09/09/20 0344 09/10/20 0330 09/11/20 0432 09/11/20 0719 09/13/20 0337  NA 133* 136 133* QUESTIONABLE RESULTS, RECOMMEND RECOLLECT TO VERIFY 133* 137  K 4.1 3.5 4.0 QUESTIONABLE RESULTS, RECOMMEND RECOLLECT TO VERIFY 4.3 4.3  CL 99 101 98 QUESTIONABLE RESULTS, RECOMMEND RECOLLECT TO VERIFY 100 101  CO2 26 28 25  QUESTIONABLE RESULTS, RECOMMEND RECOLLECT TO VERIFY 24 28  GLUCOSE 118* 108* 118* QUESTIONABLE  RESULTS, RECOMMEND RECOLLECT TO VERIFY 115* 93  BUN 32* 22 35* QUESTIONABLE RESULTS, RECOMMEND RECOLLECT TO VERIFY 49* 40*  CREATININE 4.49* 3.17* 4.62* QUESTIONABLE RESULTS, RECOMMEND RECOLLECT TO VERIFY 5.38* 4.30*  CALCIUM 9.4 9.2 10.0 QUESTIONABLE RESULTS, RECOMMEND RECOLLECT TO VERIFY 9.5 9.9  MG 2.2 2.1 2.1  --   --  2.1  PHOS 2.7 2.1* 2.8  --   --  2.6   Estimated Creatinine Clearance: 14.8 mL/min (A) (by C-G formula based on SCr of 4.3 mg/dL (H)). Liver & Pancreas: Recent Labs  Lab 09/08/20 0133 09/10/20 0330 09/13/20 0337  AST 24 38 57*  ALT 14 49* 103*  ALKPHOS 83 93 122  BILITOT 0.3 0.6 0.3  PROT 5.3* 5.4* 5.2*  ALBUMIN 1.9* 2.1* 2.0*   No results for input(s):  LIPASE, AMYLASE in the last 168 hours. No results for input(s): AMMONIA in the last 168 hours. Diabetic: No results for input(s): HGBA1C in the last 72 hours. Recent Labs  Lab 09/13/20 2035 09/13/20 2339 09/14/20 0428 09/14/20 0728 09/14/20 1138  GLUCAP 111* 117* 112* 108* 120*   Cardiac Enzymes: No results for input(s): CKTOTAL, CKMB, CKMBINDEX, TROPONINI in the last 168 hours. No results for input(s): PROBNP in the last 8760 hours. Coagulation Profile: No results for input(s): INR, PROTIME in the last 168 hours. Thyroid Function Tests: No results for input(s): TSH, T4TOTAL, FREET4, T3FREE, THYROIDAB in the last 72 hours. Lipid Profile: No results for input(s): CHOL, HDL, LDLCALC, TRIG, CHOLHDL, LDLDIRECT in the last 72 hours. Anemia Panel: No results for input(s): VITAMINB12, FOLATE, FERRITIN, TIBC, IRON, RETICCTPCT in the last 72 hours. Urine analysis:    Component Value Date/Time   COLORURINE RED (A) 08/17/2020 1234   APPEARANCEUR HAZY (A) 08/17/2020 1234   LABSPEC 1.012 08/17/2020 1234   PHURINE 8.0 08/17/2020 1234   GLUCOSEU NEGATIVE 08/17/2020 1234   GLUCOSEU NEGATIVE 10/14/2016 1144   HGBUR LARGE (A) 08/17/2020 1234   HGBUR negative 05/29/2010 0952   BILIRUBINUR NEGATIVE 08/17/2020 1234   BILIRUBINUR Negative 01/16/2020 1608   KETONESUR 5 (A) 08/17/2020 1234   PROTEINUR 100 (A) 08/17/2020 1234   UROBILINOGEN 0.2 01/16/2020 1608   UROBILINOGEN 0.2 10/14/2016 1144   NITRITE NEGATIVE 08/17/2020 1234   LEUKOCYTESUR SMALL (A) 08/17/2020 1234   Sepsis Labs: Invalid input(s): PROCALCITONIN, [place]  Microbiology: No results found for this or any previous visit (from the past 240 hour(s)).  Radiology Studies: No results found.     Satcha Storlie T. [place]  If 7PM-7AM, please contact night-coverage [email or web address] 09/14/2020, 6:22 PM

## 2020-09-14 NOTE — Progress Notes (Addendum)
Palliative Medicine Inpatient Follow Up Note  Reason for consult:  Goals of Care  HPI:  Per intake H&P --> 75 year old gentleman withextensive medical issues and comorbidities includinghistory of chronic A. fib on Coumadin, peptic ulcer disease, ESRD on hemodialysis TTS schedule, history of rectal cancer status post low anterior resection and reversal of ileostomywho presented to the ER with abdominal cramping, nausea vomiting diarrhea and rectal bleeding. Patient was recently in the hospital 2/9-2/13 with upper GI bleeding and found to have bleeding duodenal ulcer treated with epinephrine injection and clipping. Discharged on Protonix twice daily. Patient also has chronic diarrhea. Admitted with rectal bleeding, C. difficile infection and diarrhea and anemia. Patient was hemodynamically stable in the ER.Admitted with C. diff, rectal bleeding. Followed by GI.  Palliative care was consulted in the setting of difficult to treat conditions to discuss goals of care.  Today's Discussion (09/14/2020):  *Please note that this is a verbal dictation therefore any spelling or grammatical errors are due to the "[employer]" system interpretation.  Chart reviewed.  I was contacted this morning by Barkley Boards, PA. She shared that from a surgical standpoint Lyle has not improved and that he does not wish to proceed with any surgical interventions.   I was able to speak with his son, [name] thereafter and meet at bedside with Cleda Daub., Saintclair Halsted., and [place] (spouse). We reviewed that he has been hospitalized for the past twenty days and unfortunately he has not improved greatly despite all present aggressive efforts. We reviewed that even if surgery were pursued the outcome would likely be poor. I shared that the uniform recommendation by all team members involved is to pursue more of a comfort oriented approach to care.   We talked about transition to comfort measures in house and what that  would entail inclusive of medications to control pain, dyspnea, agitation, nausea, itching, and hiccups.  We discussed stopping all uneccessary measures such as blood draws, needle sticks, TPN, HD, and frequent vital signs.   Walker shares that he understands the present situation. He expresses acceptance.  We reviewed that he will transfer from here to an inpatient hospice home. His wife shares that she prefers [facility] in [place].  Utilized silence as a form of emotional support.  Patient is open to our PMT chaplain stopping in.  All [facility] Team members updated on the plan to transition to comfort oriented care.   Objective Assessment: Vital Signs Vitals:   09/13/20 2044 09/14/20 0431  BP: (!) 167/79 (!) 168/86  Pulse: 98 95  Resp: 17 18  Temp: 98.6 F (37 C) 98.6 F (37 C)  SpO2: 100% 100%    Intake/Output Summary (Last 24 hours) at 09/14/2020 1023 Last data filed at 09/14/2020 3329 Gross per 24 hour  Intake 1302.12 ml  Output 2550 ml  Net -1247.88 ml   Last Weight  Most recent update: 09/13/2020 11:38 AM   Weight  69.6 kg (153 lb 7 oz)           Gen:  Older AA male, chronically ill appearing HEENT: Dry mucous membranes CV: Irregular rate and rhythm  PULM:  2LPM [place] ABD: soft/nontender  EXT: (+) muscle wasting Neuro: Alert and Oriented  SUMMARY OF RECOMMENDATIONS   DNAR/DNI  MOST/DNR in Vynca  Comfort focused care  Liberalize visitation policy  Spiritual Support  Plan for transition to [facility] when a bed is available  Time Spent:  45 Greater than 50% of the time was spent  in counseling and coordination of care ______________________________________________________________________________________ [facility] Team Team Cell Phone: [phone] Please utilize secure chat with additional questions, if there is no response within 30 minutes please call the above phone number  Palliative Medicine Team providers  are available by phone from 7am to 7pm daily and can be reached through the team cell phone.  Should this patient require assistance outside of these hours, please call the patient's attending physician.

## 2020-09-14 NOTE — Progress Notes (Addendum)
PHARMACY - TOTAL PARENTERAL NUTRITION CONSULT NOTE   Indication: Prolonged ileus in the setting of ischemic colitis  Patient Measurements: Height: 5\' 8"  (172.7 cm) Weight: 69.6 kg (153 lb 7 oz) IBW/kg (Calculated) : 68.4   Body mass index is 23.33 kg/m. Usual Weight: ~63kg  Assessment:  [name] with hx rectal cancer s/p radiation/chemo, anteriro resection and ileostomy reversal, afib on warfarin with admit INR 8.8 now s/p reversal, ESRD on HD TTS, PUD with recent GIB, anemia of chronic disease, CVA, COPD, gout, and HTN admitted with melena, C.diff colitis, prolonged ileus, malnutrition with poor PO intake >11 days and hypoglycemia requiring D10 infusion. Patient lost ~15% of body weight in one month. Failed attempt to place NGT 2/26. PICC placement ok'd by nephrology. Pharmacy consulted for TPN.   Glucose / Insulin: A1C 5.5%. BG controlled, SSI d/c'd 3/3 Electrolytes: Labs from 3/9 corrected Ca high ~11 (none in TPN), Na 137, K 4.3; Mg 2.1 and Phos 2.6  Renal: ESRD on HD TTS - on schedule, HD 3/10 Hepatic: Labs from 3/9 LFTs up slightly AST/ALT 57/103, Tbili / TG wnl. Albumin 2, prealbumin up to 25.1 (3/7) <<  8.9 (3/2) Intake / Output; MIVF: loose stools x3 last 24 hrs, has chronic diarrhea, minimal residual UOP - 550 cc/24h; 2000 ml removed in HD GI Imaging: 2/18 CT Abd- Wall thickening of the sigmoid colon and rectum, consistent with acute colitis/proctitis 2/24 CT Abd with IMA occlusion but celiac and SMA patent; no evidence of acute ischemia. Increased distension of the stomach, small bowel and colon since 08/24/2020. 2/25 DG abd- no NGT, Gastric and small bowel dilatation 3/2 CT abd pelvis - likely focal colitis, diverticulosis, new LLL infiltrate, new density within gallbladder (likely sludge or excretion of contrast) 3/8 CT abd pelvis - pancolitis, no megacolon GI Surgeries / Procedures: 2/25 EGD- Mildly severe esophagitis with a small 2-3 mm ulcer with no bleeding; Retained fluid  and small amounts of solid debris in stomach; attempted NGT multiple times but unable to advance  2/26 Flex sig -severely ulcerated mucosa of the sigmoid colon/ rectum, possibly radiation changes vs ischemia vs infection  Central access: 3/1 TPN start date: 3/1   Nutritional Goals: (per RD recommendations 3/4) kCal: 1900-2100, Protein: 100-115g, Fluid: 1L + UOP per day Goal TPN rate is 70 mL/hr (provides 109g of protein and weekly avg of 1907 kcals per day)  Current Nutrition:  TPN CLD started 3/3, tolerating, no advancement per Surgery F/u GOC discussions, may consider transition to comfort focus soon  Plan:  Continue TPN at goal rate of 70 mL/hr at 1800 - meeting 100% of estimated weekly protein and kCal needs (50g SMOF lipids on MWF only due to national shortage) - next 3/11 Electrolytes in TPN: continue Na to 120 mEq/L, K 25 mEq/L, Mg 6 mEq/L, Ca removed, Phos at 8 mmol/L, Cl:Ac 1:1 Add standard MVI and trace elements to TPN, remove chromium with ESRD requiring RRT SSI d/c'd on 3/3 due to lack of utilization - monitor CBGs F/u TPN labs Mon/Thurs, Surgery plans, GOC  ADDENDUM: TPN discontinued  Thank you for allowing pharmacy to be a part of this patient's care.  Alanda Slim, PharmD, [facility] Clinical Pharmacist Please see AMION for all Pharmacists' Contact Phone Numbers 09/14/2020, 7:25 AM

## 2020-09-14 NOTE — TOC Initial Note (Signed)
Transition of Care (TOC) - Initial/Assessment Note    Patient Details  Name: Jason SLITER Sr. MRN: [id number] Date of Birth: 18-Feb-1946  Transition of Care [facility]) CM/SW Contact:    Marilu Favre, RN Phone Number: 09/14/2020, 12:01 PM  Clinical Narrative:                  Spoke to patient at bedside. Confirmed he does want [employer] [facility]. 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 Medicare.gov Compare Post Acute Care list provided to:: Patient Choice offered to / list presented to : Patient  Expected Discharge Plan and Services Expected Discharge Plan: [place]   Discharge Planning Services: CM Consult Post Acute Care Choice: Hospice Living arrangements for the past 2 months: Single Family Home                   DME Agency: NA       HH Arranged: NA          Prior Living Arrangements/Services Living arrangements for the past 2 months: Single Family Home Lives with:: Spouse Patient language and need for interpreter reviewed:: Yes Do you feel safe going back to the place where you live?: Yes   Requesting SNF for STR  Need for Family Participation in Patient Care: Yes (Comment) Care giver support system in place?: Yes (comment)   Criminal Activity/Legal Involvement Pertinent to Current Situation/Hospitalization: No - Comment as needed  Activities of Daily Living Home Assistive Devices/Equipment: Gilford Rile (specify type) (four wheel) ADL Screening (condition at time of admission) Patient's cognitive ability adequate to safely complete daily activities?: Yes Is the patient deaf or have difficulty hearing?: No Does the patient have difficulty seeing, even when wearing glasses/contacts?: No Does the  patient have difficulty concentrating, remembering, or making decisions?: No Patient able to express need for assistance with ADLs?: Yes Does the patient have difficulty dressing or bathing?: No Independently performs ADLs?: Yes (appropriate for developmental age) Does the patient have difficulty walking or climbing stairs?: Yes Weakness of Legs: Both Weakness of Arms/Hands: None  Permission Sought/Granted Permission sought to share information with : Chartered certified accountant granted to share information with : Yes, Verbal Permission Granted              Emotional Assessment Appearance:: Appears stated age     Orientation: : Oriented to Self,Oriented to Place,Oriented to  Time,Oriented to Situation Alcohol / Substance Use: Not Applicable Psych Involvement: No (comment)  Admission diagnosis:  GIB (gastrointestinal bleeding) [K92.2] C. difficile colitis [A04.72] Gastrointestinal hemorrhage, unspecified gastrointestinal hemorrhage type [K92.2] Patient Active Problem List   Diagnosis Date Noted  . Ischemic colitis ([place])   . Protein-calorie malnutrition, severe 09/05/2020  . Intestinal occlusion (HCC)   . Abdominal distension (gaseous)   . Gastritis and gastroduodenitis   . Noninfectious gastroenteritis   . Colon ulcer   . Generalized abdominal pain   . Non-intractable vomiting   . C. difficile colitis   . GIB (gastrointestinal bleeding) 08/24/2020  . GI bleed 08/15/2020  . Acute blood loss anemia 08/15/2020  . Supratherapeutic INR 08/15/2020  . Fibula fracture 08/15/2020  . Hematochezia   . Acute gastric ulcer with hemorrhage   . Leg edema, right 08/13/2020  . Injury of right leg 08/13/2020  .  Closed nondisplaced segmental fracture of shaft of right fibula 08/13/2020  . Cellulitis of right leg 08/06/2020  . Complication of vascular dialysis catheter 04/18/2020  . Other disorders of phosphorus metabolism 04/04/2020  . Allergy, unspecified, initial  encounter 03/01/2020  . Anaphylactic shock, unspecified, initial encounter 03/01/2020  . Fever 01/16/2020  . Benign prostatic hyperplasia without lower urinary tract symptoms 12/12/2019  . Meralgia paraesthetica, right 08/12/2019  . 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 Gait disturbance, post-stroke 04/17/2019  . Iron deficiency anemia, unspecified 03/14/2019  . Chronic obstructive pulmonary disease, unspecified ([place]) 01/25/2019  . Coagulopathy ([place]) 01/25/2019  . Diarrhea, unspecified 01/25/2019  . Hypertensive chronic kidney disease with stage 5 chronic kidney disease or end stage renal disease ([place]) 01/25/2019  . Diverticulosis of intestine, part unspecified, without perforation or abscess without bleeding 01/25/2019  . Other idiopathic peripheral autonomic neuropathy 01/25/2019  . Hypokalemia 01/25/2019  . Male erectile dysfunction, unspecified 01/25/2019  . Pain, unspecified 01/25/2019  . Primary generalized (osteo)arthritis 01/25/2019  . Pruritus, unspecified 01/25/2019  . Secondary hyperparathyroidism of renal origin ([place]) 01/25/2019  . Anticoagulated 12/31/2018  . Paroxysmal atrial fibrillation ([place]) 12/22/2018  . Longstanding persistent atrial fibrillation ([place]) 09/20/2018  . Functional diarrhea 07/14/2018  . Benign essential HTN   . ESRD on dialysis ([place]) 06/16/2018  . Mild malnutrition ([place]) 06/16/2018  . Stroke due to embolism ([place]) 06/16/2018  . Chronic HFrEF (heart failure with reduced ejection fraction) ([place])   . CVA (cerebral vascular accident) ([place]) 06/08/2018  . CHF (congestive heart failure) ([place]) 05/08/2018  . Hyperlipidemia LDL goal <70 09/23/2017  . OAB (overactive bladder) 01/09/2016  . SUI (stress urinary incontinence), male 01/09/2016  . Hypersomnia 10/30/2015  . Obesity (BMI 30.0-34.9) 10/30/2015  . Routine general medical examination at a health care facility 06/04/2015  . Medicare annual wellness visit, subsequent 06/04/2015   . Hereditary and idiopathic peripheral neuropathy 09/14/2014  . Rectal cancer ([place]) 02/01/2013  . Obstructive sleep apnea 07/29/2010  . GOUT 05/11/2007  . ERECTILE DYSFUNCTION, MILD 05/11/2007  . Essential hypertension 05/11/2007   PCP:  [name], MD Pharmacy:   [place], [place] [address] Phone: [phone] Fax: [phone]  [facility], [address], [address] [place], [place] [address] Phone: [phone] Fax: [phone]  [facility] [place], [place] [address] [place] [address] Phone: [phone] Fax: [phone]  [facility], [place] - [place], [place] - [place], [place] [address] [place] [address] Phone: [phone] Fax: [phone]  [facility] - [place], [place] - [address] [place] [address] Phone: [phone] Fax: [phone]     Social Determinants of Health (SDOH) Interventions    Readmission Risk Interventions No flowsheet data found.

## 2020-09-14 NOTE — Progress Notes (Incomplete)
Patient ID: Jason SNOWDON Sr., male   DOB: 1946-06-08, 75 y.o.   MRN: [id number]    Progress Note   Subjective   day # 20  CC; ischemic Colitis, Cdiff  WBC 13.6, HGB 7.9 - stable  Dificid # 9/10 TPN Aranesp weekly     Objective   Vital signs in last 24 hours: Temp:  [97.9 F (36.6 C)-98.6 F (37 C)] 98.6 F (37 C) (03/11 0431) Pulse Rate:  [85-98] 95 (03/11 0431) Resp:  [16-18] 18 (03/11 0431) BP: (147-187)/(57-87) 168/86 (03/11 0431) SpO2:  [100 %] 100 % (03/11 0431) Weight:  [69.6 kg] 69.6 kg (03/10 1122) Last BM Date: 09/13/20 General:    white male in NAD Heart:  Regular rate and rhythm; no murmurs Lungs: Respirations even and unlabored, lungs CTA bilaterally Abdomen:  Soft, nontender and nondistended. Normal bowel sounds. Extremities:  Without edema. Neurologic:  Alert and oriented,  grossly normal neurologically. Psych:  Cooperative. Normal mood and affect.  Intake/Output from previous day: 03/10 0701 - 03/11 0700 In: 1062.1 [P.O.:480; I.V.:582.1] Out: 2550 [Urine:550] Intake/Output this shift: No intake/output data recorded.  Lab Results: Recent Labs    09/12/20 0352 09/13/20 0337 09/14/20 0342  WBC 12.6* 16.4* 13.6*  HGB 7.3* 7.5* 7.9*  HCT 24.6* 24.8* 25.9*  PLT 257 274 220   BMET Recent Labs    09/13/20 0337  NA 137  K 4.3  CL 101  CO2 28  GLUCOSE 93  BUN 40*  CREATININE 4.30*  CALCIUM 9.9   LFT Recent Labs    09/13/20 0337  PROT 5.2*  ALBUMIN 2.0*  AST 57*  ALT 103*  ALKPHOS 122  BILITOT 0.3   PT/INR No results for input(s): LABPROT, INR in the last 72 hours.      Assessment / Plan:          Active Problems:   Hematochezia   GIB (gastrointestinal bleeding)   C. difficile colitis   Generalized abdominal pain   Non-intractable vomiting   Abdominal distension (gaseous)   Gastritis and gastroduodenitis   Noninfectious gastroenteritis   Colon ulcer   Intestinal occlusion (HCC)   Protein-calorie malnutrition,  severe   Ischemic colitis ([place])     LOS: 21 days   Amy Esterwood PA-C 09/14/2020, 8:43 AM

## 2020-09-15 DIAGNOSIS — A0472 Enterocolitis due to Clostridium difficile, not specified as recurrent: Secondary | ICD-10-CM | POA: Diagnosis not present

## 2020-09-15 DIAGNOSIS — R140 Abdominal distension (gaseous): Secondary | ICD-10-CM | POA: Diagnosis not present

## 2020-09-15 DIAGNOSIS — Z7189 Other specified counseling: Secondary | ICD-10-CM | POA: Diagnosis not present

## 2020-09-15 DIAGNOSIS — Z515 Encounter for palliative care: Secondary | ICD-10-CM | POA: Diagnosis not present

## 2020-09-15 DIAGNOSIS — K2970 Gastritis, unspecified, without bleeding: Secondary | ICD-10-CM | POA: Diagnosis not present

## 2020-09-15 DIAGNOSIS — K633 Ulcer of intestine: Secondary | ICD-10-CM | POA: Diagnosis not present

## 2020-09-15 MED ORDER — HYDROMORPHONE HCL 1 MG/ML IJ SOLN
1.0000 mg | Freq: Four times a day (QID) | INTRAMUSCULAR | Status: DC
  Administered 2020-09-15 – 2020-09-16 (×5): 1 mg via INTRAVENOUS
  Filled 2020-09-15 (×5): qty 1

## 2020-09-15 NOTE — TOC Progression Note (Signed)
Transition of Care (TOC) - Progression Note    Patient Details  Name: Jason [name] Sr. MRN: [id number] Date of Birth: 06-11-1946  Transition of Care [facility]) CM/SW [place], [place] Phone Number: 09/15/2020, 11:13 AM  Clinical Narrative:   CSW following for discharge to hospice, when ready and bed available. Hospice liaison working with family to review for hospice eligibility, will update CSW with progress. CSW to continue to follow.    Expected Discharge Plan: [place] Barriers to Discharge: Continued Medical Work up,Insurance Authorization,No SNF bed  Expected Discharge Plan and Services Expected Discharge Plan: [place]   Discharge Planning Services: CM Consult Post Acute Care Choice: Hospice Living arrangements for the past 2 months: Single Family Home                   DME Agency: NA       HH Arranged: NA           Social Determinants of Health (SDOH) Interventions    Readmission Risk Interventions No flowsheet data found.

## 2020-09-15 NOTE — Progress Notes (Signed)
Pt is now on comfort care, no further dialysis. Will sign off.   Kelly Splinter, MD 09/15/2020, 8:39 AM

## 2020-09-15 NOTE — Progress Notes (Addendum)
   Palliative Medicine Inpatient Follow Up Note  Reason for consult:  Goals of Care  HPI:  Per intake H&P --> 75 year old gentleman withextensive medical issues and comorbidities includinghistory of chronic A. fib on Coumadin, peptic ulcer disease, ESRD on hemodialysis TTS schedule, history of rectal cancer status post low anterior resection and reversal of ileostomywho presented to the ER with abdominal cramping, nausea vomiting diarrhea and rectal bleeding. Patient was recently in the hospital 2/9-2/13 with upper GI bleeding and found to have bleeding duodenal ulcer treated with epinephrine injection and clipping. Discharged on Protonix twice daily. Patient also has chronic diarrhea. Admitted with rectal bleeding, C. difficile infection and diarrhea and anemia. Patient was hemodynamically stable in the ER.Admitted with C. diff, rectal bleeding. Followed by GI.  Palliative care was consulted in the setting of difficult to treat conditions to discuss goals of care.  Today's Discussion (09/15/2020):  *Please note that this is a verbal dictation therefore any spelling or grammatical errors are due to the "[place]" system interpretation.  Chart reviewed.  I went by [facility] room this morning. He was noted to be resting and in NAD at this time. Per medication review in the last 24 hours he has received 5.5mg  of dilaudid, 1mg  of Ativan, and 650mg  of tylenol. His symptoms appear to be under fair control though patient does continue to have chronic abdominal pain according to his pain assessment chart therefore will add dilaudid 1mg  IV Q6H atc as well.   The plan for Sheryl will be to transition to [facility] once a bed is available. I was able to touch base the [facility] this morning. She plans to reach out to the patients son, [name] to confirm interest. As of yesterday there was potential for a bed to be made available today.    Objective Assessment: Vital Signs Vitals:    09/14/20 1142 09/15/20 0354  BP: (!) 155/84 (!) 167/90  Pulse: (!) 102 78  Resp: 17 17  Temp: 97.7 F (36.5 C) 98.1 F (36.7 C)  SpO2: 100% 98%    Intake/Output Summary (Last 24 hours) at 09/15/2020 0786 Last data filed at 09/15/2020 0400 Gross per 24 hour  Intake 720 ml  Output 150 ml  Net 570 ml   Last Weight  Most recent update: 09/13/2020 11:38 AM   Weight  69.6 kg (153 lb 7 oz)           Gen:  Older AA male, chronically ill appearing HEENT: Dry mucous membranes CV: Irregular rate and rhythm  PULM:  2LPM [place] ABD: soft/nontender  EXT: (+) muscle wasting Neuro: Somnolent  SUMMARY OF RECOMMENDATIONS   DNAR/DNI  MOST/DNR in Vynca  Comfort focused care  Liberalize visitation policy  Spiritual Support  Plan for transition to [facility] when a bed is available  Time Spent:  25 Greater than 50% of the time was spent in counseling and coordination of care ______________________________________________________________________________________ [place] Team Team Cell Phone: [phone] Please utilize secure chat with additional questions, if there is no response within 30 minutes please call the above phone number  Palliative Medicine Team providers are available by phone from 7am to 7pm daily and can be reached through the team cell phone.  Should this patient require assistance outside of these hours, please call the patient's attending physician.

## 2020-09-15 NOTE — Progress Notes (Addendum)
Manufacturing engineer [facility])  Referral received for residential hospice at [facility]. Have not been able to confirm interest with family yet, left messages for wife and son [name] and stopped by the room on Friday.  Once I am able to confirm with the family their interest, I can have my attending review for appropriateness to transfer to [facility].  Will attempt to visit again.  Venia Carbon RN, BSN, [facility] Liaison [phone]  **addendum - met with wife and pt at the bedside, Mr. Radin agrees that he would want to move to [facility]. [name] is overwhelmed and tearful, she wanted to speak with their son Kanan for guidance and will call me back once they have made a final decision.  **addendum - wife called back to confirm interest in [facility] and that she was on the way to [facility] to complete consents. Again advised that I needed approval from my attending and that this writer was waiting for her to call back to confirm that she was interested in moving him to [facility]. Advised her that once our attending confirmed that an appointment would have to be made with our SW at [facility] to complete consents. Wife seemed to be upset and advised the weather was too bad to complete it later. Unfortunately, our SW is visiting patients and an appointment must be made, but this cannot be done until our attending agrees. [name] advised me that she could not come later today and was not able to complete the forms electronically.

## 2020-09-15 NOTE — Progress Notes (Signed)
PROGRESS NOTE  WOODWARD KLEM Sr. QBV:[id number] DOB: [birth year]   PCP: [name], MD  Patient is from: Home  DOA: 08/24/2020 LOS: 59  Chief complaints: Abdominal pain, nausea, vomiting, diarrhea and GI bleed  Brief Narrative / Interim history: 75 year old male with PMH of C. difficile, A. fib on warfarin, PUD, ESRD on HD TTS, rectal cancer s/p resection, chemo and radiation presented to ED with the above chief complaint, and admitted for acute blood loss anemia due to GI bleed in the setting of supratherapeutic INR, and C. difficile colitis.  Gastroenterology and general surgery consulted.  CTA abdomen and pelvis on 2/24 suspicious for ileus.  Patient underwent flex sigmoidoscopy on 09/01/2020 that showed severely ulcerated mucosa of the sigmoid colon and rectum, possibly radiation change versus ischemic versus infection.  He was a started on Dificid.  However, patient continued to have persistent ileus requiring TPN.  General surgery recommended colectomy but patient refused.  Palliative medicine consulted, and patient decided to pursue full comfort care.  Family including wife and patient's son supportive of patient's wish and preference.  Patient will be transferred to [facility] once bed available.  Subjective: Seen and examined earlier this morning.  He was a sleepy.  No apparent distress.  [name] at bedside.  No concerns or questions.  Waiting on residential hospice bed at [facility].  Objective: Vitals:   09/13/20 2044 09/14/20 0431 09/14/20 1142 09/15/20 0354  BP: (!) 167/79 (!) 168/86 (!) 155/84 (!) 167/90  Pulse: 98 95 (!) 102 78  Resp: 17 18 17 17   Temp: 98.6 F (37 C) 98.6 F (37 C) 97.7 F (36.5 C) 98.1 F (36.7 C)  TempSrc: Oral Oral Oral Oral  SpO2: 100% 100% 100% 98%  Weight:      Height:        Intake/Output Summary (Last 24 hours) at 09/15/2020 1451 Last data filed at 09/15/2020 1300 Gross per 24 hour  Intake 600 ml  Output 150 ml  Net 450 ml    Filed Weights   09/13/20 0745 09/13/20 1122  Weight: 71.7 kg 69.6 kg    Examination:  GENERAL: No apparent distress.  RESP: On RA.  No IWOB.   CVS: Hemodynamically stable. MSK/EXT:  No apparent deformity. No edema.  NEURO: Sleepy.  No apparent focal neuro deficit. PSYCH: Calm.  No distress or agitation.  Procedures:  2/26-EGD and flex sigmoidoscopy 2/25-colonoscopy  Microbiology summarized: [id number] and influenza PCR nonreactive MRSA PCR screen negative  Assessment & Plan: End-of-life care/comfort measures only/DNR/DNI -Comfort pathway per palliative medicine with as needed meds -Waiting on bed at [facility] -Emotional support to patient and family as needed  C. difficile colitis-CT abdomen and pelvis suspicious for ileus.  Fleck sigmoidoscopy with severely ulcerated mucosa sigmoid colon and rectum.  P.o. vancomycin 2/18-3/3.  Deficid 3/3-3/13.   ABLA due to lower GI bleed in the setting of supratherapeutic INR: INR was 8.8 on admission.  EGD without active bleed.  Flex sigmoidoscopy with severe radiation changes, proctitis, ischemic versus inflammatory colitis and strictures.  Received vitamin K and 2 units PRBC Recent Labs    09/06/20 0524 09/07/20 0046 09/07/20 1647 09/08/20 0133 09/09/20 0344 09/10/20 0330 09/11/20 0845 09/12/20 0352 09/13/20 0337 09/14/20 0342  HGB 7.4* 7.2* 7.6* 7.5* 7.3* 7.9* 7.4* 7.3* 7.5* 7.9*   Persistent ileus/nausea/vomiting/abdominal pain/distention: Remained on TPN.  Surgery recommended colectomy but patient refused and elected to pursue comfort measures only.  History of rectal cancer status post LAR, XRT and  chemotherapy  Chronic A. fib on warfarin Supratherapeutic INR-reversed with vitamin K  ESRD on HD TTS  Hyperglycemia without history of diabetes  Generalized weakness/debility/physical deconditioning  Adult failure to thrive/severe protein calorie malnutrition Body mass index is 23.33 kg/m. Nutrition Problem:  Severe Malnutrition Etiology: chronic illness (ESRD, COPD chronic in nature exascerbated by acute GI related illness) Signs/Symptoms: severe fat depletion,severe muscle depletion,moderate muscle depletion,moderate fat depletion,energy intake < 75% for > or equal to 1 month Interventions: TPN   DVT prophylaxis:  None.  Patient is full comfort measures  Code Status: DNR/DNI Family Communication: Updated patient's wife at bedside Level of care: Telemetry Medical Status is: Inpatient  Remains inpatient appropriate because:Unsafe d/c plan   Dispo:  Patient From: Home  Planned Disposition: Residential hospice at [facility]  Medically stable for discharge: No         Consultants:  General surgery Gastroenterology Infectious disease Nephrology Palliative medicine   Sch Meds:  Scheduled Meds: . fidaxomicin  200 mg Oral BID  .  HYDROmorphone (DILAUDID) injection  1 mg Intravenous Q6H  . pantoprazole  40 mg Oral BID   Continuous Infusions: PRN Meds:.acetaminophen **OR** acetaminophen, antiseptic oral rinse, bisacodyl, glycopyrrolate, HYDROmorphone (DILAUDID) injection, hyoscyamine, lip balm, LORazepam, ondansetron (ZOFRAN) IV, polyvinyl alcohol, promethazine  Antimicrobials: Anti-infectives (From admission, onward)   Start     Dose/Rate Route Frequency Ordered Stop   09/06/20 2200  fidaxomicin (DIFICID) tablet 200 mg        200 mg Oral 2 times daily 09/06/20 1530 09/16/20 2159   09/06/20 0930  vancomycin (VANCOCIN) 50 mg/mL oral solution 500 mg  Status:  Discontinued        500 mg Oral Every 6 hours 09/06/20 0830 09/06/20 1530   09/01/20 1600  metroNIDAZOLE (FLAGYL) IVPB 500 mg  Status:  Discontinued        500 mg 100 mL/hr over 60 Minutes Intravenous Every 12 hours 09/01/20 1315 09/05/20 1430   08/26/20 1000  vancomycin (VANCOCIN) 50 mg/mL oral solution 500 mg        500 mg Oral 4 times daily 08/26/20 0740 09/03/20 2159   08/24/20 2100  vancomycin (VANCOCIN) 50 mg/mL  oral solution 125 mg  Status:  Discontinued        125 mg Oral 4 times daily 08/24/20 1717 08/26/20 0740       I have personally reviewed the following labs and images: CBC: Recent Labs  Lab 09/10/20 0330 09/11/20 0845 09/12/20 0352 09/13/20 0337 09/14/20 0342  WBC 16.9* 15.5* 12.6* 16.4* 13.6*  NEUTROABS 13.7* 13.4* 10.0* 13.9* 11.0*  HGB 7.9* 7.4* 7.3* 7.5* 7.9*  HCT 25.0* 22.8* 24.6* 24.8* 25.9*  MCV 89.6 89.8 94.6 95.8 95.2  PLT 242 263 257 274 220   BMP &GFR Recent Labs  Lab 09/09/20 0344 09/10/20 0330 09/11/20 0432 09/11/20 0719 09/13/20 0337  NA 136 133* QUESTIONABLE RESULTS, RECOMMEND RECOLLECT TO VERIFY 133* 137  K 3.5 4.0 QUESTIONABLE RESULTS, RECOMMEND RECOLLECT TO VERIFY 4.3 4.3  CL 101 98 QUESTIONABLE RESULTS, RECOMMEND RECOLLECT TO VERIFY 100 101  CO2 28 25 QUESTIONABLE RESULTS, RECOMMEND RECOLLECT TO VERIFY 24 28  GLUCOSE 108* 118* QUESTIONABLE RESULTS, RECOMMEND RECOLLECT TO VERIFY 115* 93  BUN 22 35* QUESTIONABLE RESULTS, RECOMMEND RECOLLECT TO VERIFY 49* 40*  CREATININE 3.17* 4.62* QUESTIONABLE RESULTS, RECOMMEND RECOLLECT TO VERIFY 5.38* 4.30*  CALCIUM 9.2 10.0 QUESTIONABLE RESULTS, RECOMMEND RECOLLECT TO VERIFY 9.5 9.9  MG 2.1 2.1  --   --  2.1  PHOS 2.1* 2.8  --   --  2.6   Estimated Creatinine Clearance: 14.8 mL/min (A) (by C-G formula based on SCr of 4.3 mg/dL (H)). Liver & Pancreas: Recent Labs  Lab 09/10/20 0330 09/13/20 0337  AST 38 57*  ALT 49* 103*  ALKPHOS 93 122  BILITOT 0.6 0.3  PROT 5.4* 5.2*  ALBUMIN 2.1* 2.0*   No results for input(s): LIPASE, AMYLASE in the last 168 hours. No results for input(s): AMMONIA in the last 168 hours. Diabetic: No results for input(s): HGBA1C in the last 72 hours. Recent Labs  Lab 09/13/20 2035 09/13/20 2339 09/14/20 0428 09/14/20 0728 09/14/20 1138  GLUCAP 111* 117* 112* 108* 120*   Cardiac Enzymes: No results for input(s): CKTOTAL, CKMB, CKMBINDEX, TROPONINI in the last 168 hours. No  results for input(s): PROBNP in the last 8760 hours. Coagulation Profile: No results for input(s): INR, PROTIME in the last 168 hours. Thyroid Function Tests: No results for input(s): TSH, T4TOTAL, FREET4, T3FREE, THYROIDAB in the last 72 hours. Lipid Profile: No results for input(s): CHOL, HDL, LDLCALC, TRIG, CHOLHDL, LDLDIRECT in the last 72 hours. Anemia Panel: No results for input(s): VITAMINB12, FOLATE, FERRITIN, TIBC, IRON, RETICCTPCT in the last 72 hours. Urine analysis:    Component Value Date/Time   COLORURINE RED (A) 08/17/2020 1234   APPEARANCEUR HAZY (A) 08/17/2020 1234   LABSPEC 1.012 08/17/2020 1234   PHURINE 8.0 08/17/2020 1234   GLUCOSEU NEGATIVE 08/17/2020 1234   GLUCOSEU NEGATIVE 10/14/2016 1144   HGBUR LARGE (A) 08/17/2020 1234   HGBUR negative 05/29/2010 0952   BILIRUBINUR NEGATIVE 08/17/2020 1234   BILIRUBINUR Negative 01/16/2020 1608   KETONESUR 5 (A) 08/17/2020 1234   PROTEINUR 100 (A) 08/17/2020 1234   UROBILINOGEN 0.2 01/16/2020 1608   UROBILINOGEN 0.2 10/14/2016 1144   NITRITE NEGATIVE 08/17/2020 1234   LEUKOCYTESUR SMALL (A) 08/17/2020 1234   Sepsis Labs: Invalid input(s): PROCALCITONIN, [place]  Microbiology: No results found for this or any previous visit (from the past 240 hour(s)).  Radiology Studies: No results found.     Maurina Fawaz T. Lucama  If 7PM-7AM, please contact night-coverage [email or web address] 09/15/2020, 2:51 PM

## 2020-09-16 DIAGNOSIS — K633 Ulcer of intestine: Secondary | ICD-10-CM | POA: Diagnosis not present

## 2020-09-16 DIAGNOSIS — R140 Abdominal distension (gaseous): Secondary | ICD-10-CM | POA: Diagnosis not present

## 2020-09-16 DIAGNOSIS — Z66 Do not resuscitate: Secondary | ICD-10-CM | POA: Diagnosis not present

## 2020-09-16 DIAGNOSIS — Z515 Encounter for palliative care: Secondary | ICD-10-CM | POA: Diagnosis not present

## 2020-09-16 DIAGNOSIS — Z7189 Other specified counseling: Secondary | ICD-10-CM | POA: Diagnosis not present

## 2020-09-16 DIAGNOSIS — A0472 Enterocolitis due to Clostridium difficile, not specified as recurrent: Secondary | ICD-10-CM | POA: Diagnosis not present

## 2020-09-16 MED ORDER — LORAZEPAM 2 MG/ML IJ SOLN: 1.0000 mg | INTRAMUSCULAR | 0 refills | Status: AC | PRN

## 2020-09-16 MED ORDER — ONDANSETRON HCL 4 MG PO TABS: 4.0000 mg | ORAL_TABLET | Freq: Every day | ORAL | 1 refills | Status: AC | PRN

## 2020-09-16 MED ORDER — ACETAMINOPHEN 325 MG PO TABS: 650.0000 mg | ORAL_TABLET | Freq: Four times a day (QID) | ORAL | Status: AC | PRN

## 2020-09-16 MED ORDER — BISACODYL 10 MG RE SUPP: 10.0000 mg | Freq: Every day | RECTAL | 0 refills | Status: AC | PRN

## 2020-09-16 MED ORDER — GLYCOPYRROLATE 1 MG/5ML PO SOLN: 1.0000 mg | Freq: Four times a day (QID) | ORAL | 0 refills | Status: AC | PRN

## 2020-09-16 MED ORDER — MORPHINE SULFATE (CONCENTRATE) 10 MG /0.5 ML PO SOLN: 10.0000 mg | ORAL | 0 refills | Status: AC | PRN

## 2020-09-16 NOTE — Progress Notes (Signed)
   Palliative Medicine Inpatient Follow Up Note  Reason for consult:  Goals of Care  HPI:  Per intake H&P --> 75 year old gentleman withextensive medical issues and comorbidities includinghistory of chronic A. fib on Coumadin, peptic ulcer disease, ESRD on hemodialysis TTS schedule, history of rectal cancer status post low anterior resection and reversal of ileostomywho presented to the ER with abdominal cramping, nausea vomiting diarrhea and rectal bleeding. Patient was recently in the hospital 2/9-2/13 with upper GI bleeding and found to have bleeding duodenal ulcer treated with epinephrine injection and clipping. Discharged on Protonix twice daily. Patient also has chronic diarrhea. Admitted with rectal bleeding, C. difficile infection and diarrhea and anemia. Patient was hemodynamically stable in the ER.Admitted with C. diff, rectal bleeding. Followed by GI.  Palliative care was consulted in the setting of difficult to treat conditions to discuss goals of care.  Today's Discussion (09/16/2020):  *Please note that this is a verbal dictation therefore any spelling or grammatical errors are due to the "[place]" system interpretation.  Chart reviewed.  I went by [facility] room this morning. He was noted to be resting and in NAD at this time. Per medication review in the last 24 hours he has received 3mg  of PRN dilaudid.  His symptoms appear to be under better control with the ATC dilaudid. He has not required any ativan.   [place] is more lethargic on assessment this morning. Per his night RN, Izora Gala he slept well and was in less discomfort than on her prior evening with him. He remains to have loose BM's.  The plan remains for for [facility] to transition to [facility] once a bed is available. Venia Carbon has been working with his wife in a attempt to get BP consent signed. This process will likely take place today.    Objective Assessment: Vital Signs Vitals:   09/15/20 0354  09/16/20 0450  BP: (!) 167/90 112/61  Pulse: 78 60  Resp: 17 17  Temp: 98.1 F (36.7 C) 98.2 F (36.8 C)  SpO2: 98% 98%    Intake/Output Summary (Last 24 hours) at 09/16/2020 8588 Last data filed at 09/15/2020 2305 Gross per 24 hour  Intake 240 ml  Output 400 ml  Net -160 ml   Last Weight  Most recent update: 09/13/2020 11:38 AM   Weight  69.6 kg (153 lb 7 oz)           Gen:  Older AA male, chronically ill appearing HEENT: Dry mucous membranes CV: Irregular rate and rhythm  PULM:  2LPM [place] ABD: soft/nontender  EXT: (+) muscle wasting Neuro: Somnolent  SUMMARY OF RECOMMENDATIONS   DNAR/DNI  MOST/DNR in [place]  Comfort focused care   Comfort meds per [facility] on 3/12 added ATC dilaudid to help with abdominal pain  Liberalize visitation policy  Spiritual Support  Plan for transition to [facility] when a bed is available  Time Spent:  25 Greater than 50% of the time was spent in counseling and coordination of care ______________________________________________________________________________________ [place] Team Team Cell Phone: [phone] Please utilize secure chat with additional questions, if there is no response within 30 minutes please call the above phone number  Palliative Medicine Team providers are available by phone from 7am to 7pm daily and can be reached through the team cell phone.  Should this patient require assistance outside of these hours, please call the patient's attending physician.

## 2020-09-16 NOTE — TOC Transition Note (Signed)
Transition of Care [facility]) - CM/SW Discharge Note   Patient Details  Name: DAMARCUS REGGIO Sr. MRN: [id number] Date of Birth: Oct 25, 1945  Transition of Care [facility]) CM/SW Contact:  Angelita Ingles, RN Phone Number: [phone]  09/16/2020, 11:48 AM   Clinical Narrative:    Patient to be discharged to [facility]. Discharge packet has been sent to unit. Transport has been set up with [facility]. Son Estevan has been notified. No other needs noted at this time. TOC will sign off.   Final next level of care: [place] Barriers to Discharge: No Barriers Identified   Patient Goals and CMS Choice Patient states their goals for this hospitalization and ongoing recovery are:: [facility] comfort CMS Medicare.gov Compare Post Acute Care list provided to:: Patient Choice offered to / list presented to : Patient  Discharge Placement              Patient chooses bed at:  [facility]) Patient to be transferred to facility by: [place] Name of family member notified: [name] Patient and family notified of of transfer: 09/16/20  Discharge Plan and Services   Discharge Planning Services: CM Consult Post Acute Care Choice: Hospice          DME Arranged: N/A DME Agency: NA       HH Arranged: NA HH Agency: NA        Social Determinants of Health ([place]) Interventions     Readmission Risk Interventions No flowsheet data found.

## 2020-09-16 NOTE — Progress Notes (Addendum)
Manufacturing engineer [facility])  [facility] has a bed for Mr. Cromartie today.  His wife and son Lark are meeting with [place] SW this am to complete necessary paperwork.  Once this is completed, this Probation officer will update [facility] so transportation can be arranged.  RN staff, please call report to [phone] at any time to provide report. Room is assigned at this time.  Please update wife [name] or son Dominik once transport arrives (if they are not at the bedside) so they will know an anticipated time that he will arrive at [facility].  Once completed, please fax d/c summary to [phone].   Thank you, Venia Carbon RN, BSN, [facility] Liaison    **all necessary paperwork has been completed and transport can be arranged to [facility].  Please call son Kemonte to update when transport is anticipated. TOC manager updated via [place].

## 2020-09-16 NOTE — Discharge Summary (Signed)
Physician Discharge Summary  Jason JACOME Sr. KYH:[id number] DOB: 1945-07-20 DOA: 08/24/2020  PCP: [name], MD  Admit date: 08/24/2020 Discharge date: 09/16/2020  Admitted From: Home Disposition: Residential hospice/[facility]   Discharge Condition: Stable for transfer CODE STATUS: DNR   Hospital Course: 75 year old male with PMH of C. difficile, A. fib on warfarin, PUD, ESRD on HD TTS, rectal cancer s/p resection, chemo and radiation presented to ED with the above chief complaint, and admitted for acute blood loss anemia due to GI bleed in the setting of supratherapeutic INR, and C. difficile colitis.  Gastroenterology and general surgery consulted.  CTA abdomen and pelvis on 2/24 suspicious for ileus.  Patient underwent flex sigmoidoscopy on 09/01/2020 that showed severely ulcerated mucosa of the sigmoid colon and rectum, possibly radiation change versus ischemic versus infection.  He was a started on Dificid.  However, patient continued to have persistent ileus requiring TPN.  General surgery recommended colectomy but patient refused.  Palliative medicine consulted, and patient decided to pursue full comfort care.  Family including wife and patient's son supportive of patient's wish and preference.  Patient transferred to [place] for end-of-life care.  See individual problem list below for more on hospital course.  Discharge Diagnoses:  End-of-life care/comfort measures only/DNR/DNI -Comfort pathway per palliative medicine with as needed meds -Transferred to [facility]  C. difficile colitis-CT abdomen and pelvis suspicious for ileus.  Fleck sigmoidoscopy with severely ulcerated mucosa sigmoid colon and rectum.  P.o. vancomycin 2/18-3/3.  Deficid 3/3-3/13.   ABLA due to lower GI bleed in the setting of supratherapeutic INR: INR was 8.8 on admission.  Persistent ileus/nausea/vomiting/abdominal pain/distention: Remained on TPN.  Surgery recommended colectomy but  patient refused and elected to pursue comfort measures only.  History of rectal cancer status post LAR, XRT and chemotherapy  Chronic A. fib on warfarin Supratherapeutic INR-reversed with vitamin K  ESRD on HD TTS  Hyperglycemia without history of diabetes  Generalized weakness/debility/physical deconditioning  Adult failure to thrive/severe protein calorie malnutrition     Discharge Exam: Vitals:   09/15/20 0354 09/16/20 0450  BP: (!) 167/90 112/61  Pulse: 78 60  Resp: 17 17  Temp: 98.1 F (36.7 C) 98.2 F (36.8 C)  SpO2: 98% 98%    GENERAL: No apparent distress.  Nontoxic. HEENT: MMM.  Vision and hearing grossly intact.  RESP: On RA.  No IWOB.  Fair aeration bilaterally. CVS:  RRR. Heart sounds normal.  MSK/EXT:  Moves extremities. No apparent deformity. No edema.  NEURO: Awake, alert and oriented appropriately.  No apparent focal neuro deficit. PSYCH: Calm. Normal affect.  Discharge Instructions   Allergies as of 09/16/2020      Reactions   Nsaids Other (See Comments)   Asked by surgeon to add this medication class as intolerance due to patients renal insufficiency.   Lactase Diarrhea   Lactose Intolerance (gi) Diarrhea   Phentermine Other (See Comments)   Unknown reaction   Amlodipine Other (See Comments)   Edema      Medication List    STOP taking these medications   atorvastatin 20 MG tablet Commonly known as: LIPITOR   cinacalcet 30 MG tablet Commonly known as: SENSIPAR   diphenoxylate-atropine 2.5-0.025 MG tablet Commonly known as: Lomotil   Fosrenol 1000 MG Pack Generic drug: Lanthanum Carbonate   gabapentin 100 MG capsule Commonly known as: NEURONTIN   lipase/protease/amylase 36000 UNITS Cpep capsule Commonly known as: Creon   oxyCODONE 5 MG immediate release tablet Commonly known  as: Oxy IR/ROXICODONE   pantoprazole 40 MG tablet Commonly known as: PROTONIX   polycarbophil 625 MG tablet Commonly known as: FIBERCON    warfarin 5 MG tablet Commonly known as: COUMADIN     TAKE these medications   acetaminophen 325 MG tablet Commonly known as: TYLENOL Take 2 tablets (650 mg total) by mouth every 6 (six) hours as needed for mild pain (or Fever >/= 101). What changed:   when to take this  reasons to take this   bisacodyl 10 MG suppository Commonly known as: DULCOLAX Place 1 suppository (10 mg total) rectally daily as needed (constipation).   Glycopyrrolate 1 MG/5ML Soln Take 5 mLs (1 mg total) by mouth 4 (four) times daily as needed for up to 3 days.   LORazepam 2 MG/ML injection Commonly known as: ATIVAN Inject 0.5 mLs (1 mg total) into the vein every 4 (four) hours as needed.   morphine CONCENTRATE 10 mg / 0.5 ml concentrated solution Take 0.5 mLs (10 mg total) by mouth every 3 (three) hours as needed for moderate pain, severe pain or shortness of breath.   ondansetron 4 MG tablet Commonly known as: Zofran Take 1 tablet (4 mg total) by mouth daily as needed for nausea or vomiting. What changed:   when to take this  reasons to take this       Consultations:  General surgery  Gastroenterology  Nephrology  Infectious disease  Palliative medicine  Procedures/Studies:  2/26-EGD and flex sigmoidoscopy   CT ABDOMEN PELVIS WO CONTRAST  Result Date: 08/24/2020 CLINICAL DATA:  Acute abdominal pain EXAM: CT ABDOMEN AND PELVIS WITHOUT CONTRAST TECHNIQUE: Multidetector CT imaging of the abdomen and pelvis was performed following the standard protocol without IV contrast. COMPARISON:  None. FINDINGS: LOWER CHEST: Normal. HEPATOBILIARY: Normal hepatic contours. No intra- or extrahepatic biliary dilatation. The gallbladder is normal. PANCREAS: Normal pancreas. No ductal dilatation or peripancreatic fluid collection. SPLEEN: Normal. ADRENALS/URINARY TRACT: The adrenal glands are normal. Hyperdense right renal cyst measures 0.5 cm. Hyperdense left renal lesion measures 1.3 cm. No  hydronephrosis. The urinary bladder is normal for degree of distention STOMACH/BOWEL: There is no hiatal hernia. Normal duodenal course and caliber. No small bowel dilatation or inflammation. There is wall thickening of the sigmoid colon and rectum with surrounding inflammatory stranding. No free intraperitoneal air or abscess. Normal appendix. VASCULAR/LYMPHATIC: There is calcific atherosclerosis of the abdominal aorta. No lymphadenopathy. REPRODUCTIVE: Normal prostate size with symmetric seminal vesicles. MUSCULOSKELETAL. No bony spinal canal stenosis or focal osseous abnormality. OTHER: None. IMPRESSION: 1. Wall thickening of the sigmoid colon and rectum with surrounding inflammatory stranding, consistent with acute colitis/proctitis. No free intraperitoneal air or abscess. Aortic Atherosclerosis (ICD10-I70.0). Electronically Signed   By: Ulyses Jarred M.D.   On: 08/24/2020 23:37   DG Abd 1 View  Result Date: 08/31/2020 CLINICAL DATA:  Nasogastric tube placement EXAM: ABDOMEN - 1 VIEW COMPARISON:  June 08, 2018 FINDINGS: Nasogastric tube is not appreciable. There is gastric and small bowel dilatation with air. Colon does not appear appreciably dilated. No free air evident. IMPRESSION: 1.  Nasogastric tube not appreciable. 2. Gastric and small bowel dilatation. A degree of bowel obstruction must be of concern. 3.  No appreciable free air on supine examination. These results will be called to the ordering clinician or representative by the Radiologist Assistant, and communication documented in the PACS or [employer]. Electronically Signed   By: Lowella Grip III M.D.   On: 08/31/2020 16:21   CT ABDOMEN PELVIS  W CONTRAST  Result Date: 09/11/2020 CLINICAL DATA:  Infectious gastroenteritis or colitis. EXAM: CT ABDOMEN AND PELVIS WITH CONTRAST TECHNIQUE: Multidetector CT imaging of the abdomen and pelvis was performed using the standard protocol following bolus administration of intravenous  contrast. CONTRAST:  187mL OMNIPAQUE IOHEXOL 300 MG/ML  SOLN COMPARISON:  September 05, 2020 FINDINGS: Lower chest: There is a trace left-sided pleural effusion with adjacent airspace disease suggestive of atelectasis. There are bilateral calcified pleural based plaques. Coronary artery disease is noted. Hepatobiliary: The liver is normal. There is gallbladder sludge without CT evidence for acute cholecystitis.There is no biliary ductal dilation. Pancreas: Normal contours without ductal dilatation. No peripancreatic fluid collection. Spleen: Unremarkable. Adrenals/Urinary Tract: --Adrenal glands: Unremarkable. --Right kidney/ureter: Right-sided complex and simple renal cysts are again noted. --Left kidney/ureter: A left-sided complex and simple renal cysts are again noted. --Urinary bladder: Unremarkable. Stomach/Bowel: --Stomach/Duodenum: No hiatal hernia or other gastric abnormality. Normal duodenal course and caliber. --Small bowel: Unremarkable. --Colon: There is diffuse circumferential wall thickening of virtually all of the colon. There are scattered colonic diverticula. --Appendix: Normal. Vascular/Lymphatic: Atherosclerotic calcification is present within the non-aneurysmal abdominal aorta, without hemodynamically significant stenosis. --No retroperitoneal lymphadenopathy. --No mesenteric lymphadenopathy. --No pelvic or inguinal lymphadenopathy. Reproductive: The prostate gland is enlarged. Other: No ascites or free air. The abdominal wall is normal. Musculoskeletal. There is mild left femoral head avascular necrosis. There is no acute osseous abnormality. IMPRESSION: 1. Diffuse circumferential wall thickening of virtually all of the colon, consistent with infectious or inflammatory pancolitis. 2. Trace left-sided pleural effusion with adjacent airspace disease suggestive of atelectasis. 3. Bilateral calcified pleural based plaques, consistent with prior asbestos exposure. 4. Mild left femoral head avascular  necrosis. 5. Additional chronic findings are noted, not substantially changed from prior study. Aortic Atherosclerosis (ICD10-I70.0). Electronically Signed   By: Constance Holster M.D.   On: 09/11/2020 15:43   CT ABDOMEN PELVIS W CONTRAST  Result Date: 09/05/2020 CLINICAL DATA:  Abdominal pain EXAM: CT ABDOMEN AND PELVIS WITH CONTRAST TECHNIQUE: Multidetector CT imaging of the abdomen and pelvis was performed using the standard protocol following bolus administration of intravenous contrast. CONTRAST:  133mL OMNIPAQUE IOHEXOL 300 MG/ML  SOLN COMPARISON:  08/29/2020 FINDINGS: Lower chest: Mild infiltrate is noted in the left lung base. Minimal pleural effusion is noted bilaterally. Hepatobiliary: Dependent density is noted within the gallbladder new from the prior exam likely representing vicarious excretion of contrast or mild gallbladder sludge. Scattered small hypodensities are again seen within the liver likely representing cysts but incompletely evaluated on this exam. Pancreas: Unremarkable. No pancreatic ductal dilatation or surrounding inflammatory changes. Spleen: Normal in size without focal abnormality. Adrenals/Urinary Tract: Adrenal glands are within normal limits. Kidneys demonstrate a normal enhancement pattern with multiple bilateral cysts stable in appearance from the prior exam. Some of these are hyperdense in nature consistent with proteinaceous or hemorrhagic cysts. No obstructive changes are seen. The bladder is partially distended. Stomach/Bowel: Colon is predominately decompressed. Scattered diverticular changes noted without evidence of diverticulitis. The appendix is within normal limits. Stomach is decompressed when compared with the prior exam. No small bowel dilatation is seen. Mild inflammatory change surrounding the rectum is seen, slightly progressed when compared with the prior exam. Previous anastomosis in the small bowel is again identified and stable. Vascular/Lymphatic: Aortic  atherosclerosis. No enlarged abdominal or pelvic lymph nodes. Reproductive: Prostate is unremarkable. Other: No abdominal wall hernia or abnormality. No abdominopelvic ascites. Musculoskeletal: Degenerative changes of lumbar spine are noted. Defect is noted within the right  rectus muscle consistent with prior ileostomy which has been reversed. IMPRESSION: Mild increased perirectal inflammatory change likely representing some focal colitis. Diverticulosis without diverticulitis. Multiple stable cystic lesions within the kidneys. New mild infiltrate in the left lower lobe. Minimal effusions are noted bilaterally. New dependent density within the gallbladder likely representing sludge or vicarious excretion of contrast. Electronically Signed   By: Inez Catalina M.D.   On: 09/05/2020 11:51   DG Abd 2 Views  Result Date: 09/04/2020 CLINICAL DATA:  Abdominal distension, abdominal pain, GI bleed, question Clostridium difficile EXAM: ABDOMEN - 2 VIEW COMPARISON:  08/31/2020 FINDINGS: Dilated small bowel loops in the mid abdomen. Some gas and contrast present in the colon. Biopsy clip ascending colon. Minimal rectal gas. No bowel wall thickening or free air. Osseous structures unremarkable. IMPRESSION: Persistent distention of small bowel loops in the mid abdomen, slightly decreased from previous exam, likely representing slightly improved small bowel obstruction. Electronically Signed   By: Lavonia Dana M.D.   On: 09/04/2020 14:39   [place] EKG SITE RITE  Result Date: 09/04/2020 If Site Rite image not attached, placement could not be confirmed due to current cardiac rhythm.  [place] EKG SITE RITE  Result Date: 09/03/2020 If [facility] image not attached, placement could not be confirmed due to current cardiac rhythm.  CT Angio Abd/Pel w/ and/or w/o  Result Date: 08/30/2020 CLINICAL DATA:  75 year old with intestinal malabsorption. Abdominal pain. Diarrhea. Evaluate for mesenteric ischemia. History of rectal cancer. EXAM:  CTA ABDOMEN AND PELVIS WITHOUT AND WITH CONTRAST TECHNIQUE: Multidetector CT imaging of the abdomen and pelvis was performed using the standard protocol during bolus administration of intravenous contrast. Multiplanar reconstructed images and MIPs were obtained and reviewed to evaluate the vascular anatomy. CONTRAST:  54mL OMNIPAQUE IOHEXOL 350 MG/ML SOLN COMPARISON:  CT abdomen and pelvis 08/24/2020 and chest CT 03/14/2020 FINDINGS: VASCULAR Aorta: Normal caliber of the visualized thoracic aorta. Abdominal aorta has mild atherosclerotic disease without aneurysm or dissection. Celiac: Atherosclerotic calcifications at the origin without significant stenosis. Main branch vessels are patent without evidence of aneurysm. SMA: SMA is widely patent. Variant anatomy with a replaced right hepatic artery. Main SMA branches are patent. Renals: Bilateral renal arteries are patent. At least mild stenosis involving the proximal right renal artery. No evidence for renal artery aneurysm. IMA: Origin of the IMA appears to be occluded. There is flow in the distal IMA branches through collateral flow from the SMA. Inflow: Atherosclerotic plaque in the iliac arteries. The common iliac arteries are patent without significant stenosis. Focal narrowing in the distal left external iliac artery causing mild-to-moderate narrowing. Right external iliac artery is patent without significant stenosis. Atherosclerotic plaque and high-grade stenosis involving the proximal internal iliac arteries bilaterally. Proximal Outflow: Proximal femoral arteries are patent bilaterally. Veins: Portal venous system is patent. SMV is patent. IVC is small but patent. Iliac veins are patent. No gross venous abnormality. Review of the MIP images confirms the above findings. NON-VASCULAR Lower chest: Visualized pulmonary arteries are patent. Heart size is normal without significant pericardial effusion. Distal esophagus is distended with fluid. Patient has had  previous left lung surgery. Chronic soft tissue thickening or nodularity in the left upper chest on sequence 1, image 12 is similar to the chest CT from 09/12/2019. Stable pleural-based thickening with calcifications in the anterior left chest. Peripheral densities in left lower lobe are most compatible with atelectasis. Stable pleural-based calcifications in the anterior right chest. Bandlike density in the right lower lobe is suggestive for atelectasis.  No large pleural effusions. Hepatobiliary: Indeterminate 7 mm hypodensity in the right hepatic lobe on sequence 11, image 25. No suspicious hepatic lesions. Normal appearance of the gallbladder. No biliary dilatation. Pancreas: Unremarkable. No pancreatic ductal dilatation or surrounding inflammatory changes. Spleen: Normal in size without focal abnormality. Adrenals/Urinary Tract: Normal appearance of both adrenal glands. Again noted are hyperdense and hypodense lesions in both kidneys. Largest hyperdense area in the right kidney lower pole measures up to 4.4 cm and Hounsfield units do not significant change between the arterial and venous phase. Largest hyperdense lesion on the left measures up to 2.6 cm and Hounsfield units do not significantly change between the arterial and venous phase images. No hydronephrosis. Normal appearance of the urinary bladder. Stomach/Bowel: Since 08/24/2020, there has been increased distension of the stomach. Increased colonic distension and increased small bowel distention particularly in the lower abdomen. Again noted is perirectal stranding and edema. Small amount of presacral edema. Moderate amount of stool like material in the rectum. Dilated loop of small bowel in the left lower quadrant measures up to 3.7 cm. No focal wall thickening. Evidence for a bowel anastomosis in the right anterior abdomen on sequence 5, image 90. Lymphatic: No significant lymph node enlargement in the abdomen or pelvis. Reproductive: Prostate is  unremarkable. Other: Mild subcutaneous edema. No significant ascites. There may be trace edema in the anterior abdomen on sequence 5, image 78. Musculoskeletal: Stable sclerosis in the superior left femoral head could represent a small focus of avascular necrosis. Bridging osteophytes along the right side of the thoracic spine. IMPRESSION: VASCULAR 1. 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 There appears to be distal reconstitution of the IMA distribution. Celiac artery and SMA are patent without significant stenosis. 2. Mild atherosclerotic disease in the abdominal aorta without aneurysm. 3. Severe stenosis involving the proximal internal iliac arteries bilaterally. Mild-to-moderate narrowing in the left external iliac artery. NON-VASCULAR 1. Increased distension of the stomach, small bowel and colon since 08/24/2020. Based on the history of diarrhea, findings are unlikely related to an obstructive process. Persistent perirectal stranding and edema that could represent inflammatory changes near the rectum. Overall, no significant bowel wall thickening. 2. Dilatation of the distal esophagus related to the gastric distension. 3. Again noted are numerous hyperdense and hypodense renal lesions. The hyperdense lesions are similar to the recent noncontrast examination and these hyperdense lesions do not significantly change in Hounsfield units between the arterial and venous phase. Technically, these are indeterminate lesions but suspect these represent hemorrhagic or proteinaceous cysts. 4. Stable nodularity in the left upper chest. Electronically Signed   By: Markus Daft M.D.   On: 08/30/2020 11:15        The results of significant diagnostics from this hospitalization (including imaging, microbiology, ancillary and laboratory) are listed below for reference.     Microbiology: No results found for this or any previous visit (from the past 240 hour(s)).   Labs:  CBC: Recent Labs  Lab  09/10/20 0330 09/11/20 0845 09/12/20 0352 09/13/20 0337 09/14/20 0342  WBC 16.9* 15.5* 12.6* 16.4* 13.6*  NEUTROABS 13.7* 13.4* 10.0* 13.9* 11.0*  HGB 7.9* 7.4* 7.3* 7.5* 7.9*  HCT 25.0* 22.8* 24.6* 24.8* 25.9*  MCV 89.6 89.8 94.6 95.8 95.2  PLT 242 263 257 274 220   BMP &GFR Recent Labs  Lab 09/10/20 0330 09/11/20 0432 09/11/20 0719 09/13/20 0337  NA 133* QUESTIONABLE RESULTS, RECOMMEND RECOLLECT TO VERIFY 133* 137  K 4.0 QUESTIONABLE RESULTS, RECOMMEND RECOLLECT TO VERIFY 4.3  4.3  CL 98 QUESTIONABLE RESULTS, RECOMMEND RECOLLECT TO VERIFY 100 101  CO2 25 QUESTIONABLE RESULTS, RECOMMEND RECOLLECT TO VERIFY 24 28  GLUCOSE 118* QUESTIONABLE RESULTS, RECOMMEND RECOLLECT TO VERIFY 115* 93  BUN 35* QUESTIONABLE RESULTS, RECOMMEND RECOLLECT TO VERIFY 49* 40*  CREATININE 4.62* QUESTIONABLE RESULTS, RECOMMEND RECOLLECT TO VERIFY 5.38* 4.30*  CALCIUM 10.0 QUESTIONABLE RESULTS, RECOMMEND RECOLLECT TO VERIFY 9.5 9.9  MG 2.1  --   --  2.1  PHOS 2.8  --   --  2.6   Estimated Creatinine Clearance: 14.8 mL/min (A) (by C-G formula based on SCr of 4.3 mg/dL (H)). Liver & Pancreas: Recent Labs  Lab 09/10/20 0330 09/13/20 0337  AST 38 57*  ALT 49* 103*  ALKPHOS 93 122  BILITOT 0.6 0.3  PROT 5.4* 5.2*  ALBUMIN 2.1* 2.0*   No results for input(s): LIPASE, AMYLASE in the last 168 hours. No results for input(s): AMMONIA in the last 168 hours. Diabetic: No results for input(s): HGBA1C in the last 72 hours. Recent Labs  Lab 09/13/20 2035 09/13/20 2339 09/14/20 0428 09/14/20 0728 09/14/20 1138  GLUCAP 111* 117* 112* 108* 120*   Cardiac Enzymes: No results for input(s): CKTOTAL, CKMB, CKMBINDEX, TROPONINI in the last 168 hours. No results for input(s): PROBNP in the last 8760 hours. Coagulation Profile: No results for input(s): INR, PROTIME in the last 168 hours. Thyroid Function Tests: No results for input(s): TSH, T4TOTAL, FREET4, T3FREE, THYROIDAB in the last 72 hours. Lipid  Profile: No results for input(s): CHOL, HDL, LDLCALC, TRIG, CHOLHDL, LDLDIRECT in the last 72 hours. Anemia Panel: No results for input(s): VITAMINB12, FOLATE, FERRITIN, TIBC, IRON, RETICCTPCT in the last 72 hours. Urine analysis:    Component Value Date/Time   COLORURINE RED (A) 08/17/2020 1234   APPEARANCEUR HAZY (A) 08/17/2020 1234   LABSPEC 1.012 08/17/2020 1234   PHURINE 8.0 08/17/2020 1234   GLUCOSEU NEGATIVE 08/17/2020 1234   GLUCOSEU NEGATIVE 10/14/2016 1144   HGBUR LARGE (A) 08/17/2020 1234   HGBUR negative 05/29/2010 0952   BILIRUBINUR NEGATIVE 08/17/2020 1234   BILIRUBINUR Negative 01/16/2020 1608   KETONESUR 5 (A) 08/17/2020 1234   PROTEINUR 100 (A) 08/17/2020 1234   UROBILINOGEN 0.2 01/16/2020 1608   UROBILINOGEN 0.2 10/14/2016 1144   NITRITE NEGATIVE 08/17/2020 1234   LEUKOCYTESUR SMALL (A) 08/17/2020 1234   Sepsis Labs: Invalid input(s): PROCALCITONIN, LACTICIDVEN   Time coordinating discharge: 35 minutes  SIGNED:  Mercy Riding, MD  [facility] 09/16/2020, 10:36 AM  If 7PM-7AM, please contact night-coverage [email or web address]

## 2020-09-18 ENCOUNTER — Ambulatory Visit (INDEPENDENT_AMBULATORY_CARE_PROVIDER_SITE_OTHER): Admitting: *Deleted

## 2020-09-18 ENCOUNTER — Telehealth: Payer: Self-pay

## 2020-09-18 DIAGNOSIS — I10 Essential (primary) hypertension: Secondary | ICD-10-CM

## 2020-09-18 DIAGNOSIS — N186 End stage renal disease: Secondary | ICD-10-CM | POA: Diagnosis not present

## 2020-09-18 DIAGNOSIS — Z992 Dependence on renal dialysis: Secondary | ICD-10-CM

## 2020-09-18 NOTE — Telephone Encounter (Signed)
-----   Message from Doran Stabler, MD sent at 09/17/2020  5:45 PM EDT ----- Regarding: [place] appointment This unfortunate patient of mine was just discharged over the weekend after a month-long hospitalization.  He appears to been scheduled for a follow-up appointment with me on March 22, but he no longer needs that because he was discharged to hospice.  Please contact his wife [name] and tell her they certainly do not need to bring him to the office, and give them my best.  - HD

## 2020-09-18 NOTE — Chronic Care Management (AMB) (Signed)
Chronic Care Management   CCM RN Visit Note  09/18/2020 Name: Jason SHEEHAN Sr. MRN: [id number] DOB: 02/17/1946  Subjective: Jason Cotta Sr. is a 75 y.o. year old male who is a primary care patient of [name], MD. The care management team was consulted for assistance with disease management and care coordination needs.    Care Coordination outreach: in response to provider referral for case management and/or care coordination services.   Received notification from [facility] liaison that patient was discharged from hospital on Monday September 17, 2020 to residential [place] ([place]); RN CCM case closure with completion of care plan goals; as noted below, patient will have multidisciplinary care management services at residential hospice home.  Consent to Services:  The patient was given information about Chronic Care Management services, agreed to services, and gave verbal consent prior to initiation of services.  Please see initial visit note for detailed documentation.   Patient agreed to services and verbal consent obtained.   Assessment: Review of patient past medical history, allergies, medications, health status, including review of consultants reports, laboratory and other test data, was performed as part of comprehensive evaluation and provision of chronic care management services.   SDOH (Social Determinants of Health) assessments and interventions performed:    CCM Care Plan  Allergies  Allergen Reactions  . Nsaids Other (See Comments)    Asked by surgeon to add this medication class as intolerance due to patients renal insufficiency.  . Lactase Diarrhea  . Lactose Intolerance (Gi) Diarrhea  . Phentermine Other (See Comments)    Unknown reaction  . Amlodipine Other (See Comments)    Edema     Outpatient Encounter Medications as of 09/18/2020  Medication Sig  . acetaminophen (TYLENOL) 325 MG tablet Take 2 tablets (650 mg total) by mouth every 6  (six) hours as needed for mild pain (or Fever >/= 101).  . bisacodyl (DULCOLAX) 10 MG suppository Place 1 suppository (10 mg total) rectally daily as needed (constipation).  . Glycopyrrolate 1 MG/5ML SOLN Take 5 mLs (1 mg total) by mouth 4 (four) times daily as needed for up to 3 days.  Marland Kitchen LORazepam (ATIVAN) 2 MG/ML injection Inject 0.5 mLs (1 mg total) into the vein every 4 (four) hours as needed.  . Morphine Sulfate (MORPHINE CONCENTRATE) 10 mg / 0.5 ml concentrated solution Take 0.5 mLs (10 mg total) by mouth every 3 (three) hours as needed for moderate pain, severe pain or shortness of breath.  . ondansetron (ZOFRAN) 4 MG tablet Take 1 tablet (4 mg total) by mouth daily as needed for nausea or vomiting.   No facility-administered encounter medications on file as of 09/18/2020.    Patient Active Problem List   Diagnosis Date Noted  . Ischemic colitis ([place])   . Protein-calorie malnutrition, severe 09/05/2020  . Intestinal occlusion (HCC)   . Abdominal distension (gaseous)   . Gastritis and gastroduodenitis   . Noninfectious gastroenteritis   . Colon ulcer   . Generalized abdominal pain   . Non-intractable vomiting   . C. difficile colitis   . GIB (gastrointestinal bleeding) 08/24/2020  . GI bleed 08/15/2020  . Acute blood loss anemia 08/15/2020  . Supratherapeutic INR 08/15/2020  . Fibula fracture 08/15/2020  . Hematochezia   . Acute gastric ulcer with hemorrhage   . Leg edema, right 08/13/2020  . Injury of right leg 08/13/2020  . Closed nondisplaced segmental fracture of shaft of right fibula 08/13/2020  .  Cellulitis of right leg 08/06/2020  . Complication of vascular dialysis catheter 04/18/2020  . Other disorders of phosphorus metabolism 04/04/2020  . Allergy, unspecified, initial encounter 03/01/2020  . Anaphylactic shock, unspecified, initial encounter 03/01/2020  . Fever 01/16/2020  . Benign prostatic hyperplasia without lower urinary tract symptoms 12/12/2019  . Meralgia  paraesthetica, right 08/12/2019  . 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 Gait disturbance, post-stroke 04/17/2019  . Iron deficiency anemia, unspecified 03/14/2019  . Chronic obstructive pulmonary disease, unspecified ([place]) 01/25/2019  . Coagulopathy ([place]) 01/25/2019  . Diarrhea, unspecified 01/25/2019  . Hypertensive chronic kidney disease with stage 5 chronic kidney disease or end stage renal disease ([place]) 01/25/2019  . Diverticulosis of intestine, part unspecified, without perforation or abscess without bleeding 01/25/2019  . Other idiopathic peripheral autonomic neuropathy 01/25/2019  . Hypokalemia 01/25/2019  . Male erectile dysfunction, unspecified 01/25/2019  . Pain, unspecified 01/25/2019  . Primary generalized (osteo)arthritis 01/25/2019  . Pruritus, unspecified 01/25/2019  . Secondary hyperparathyroidism of renal origin ([place]) 01/25/2019  . Anticoagulated 12/31/2018  . Paroxysmal atrial fibrillation ([place]) 12/22/2018  . Longstanding persistent atrial fibrillation ([place]) 09/20/2018  . Functional diarrhea 07/14/2018  . Benign essential HTN   . ESRD on dialysis ([place]) 06/16/2018  . Mild malnutrition ([name]) 06/16/2018  . Stroke due to embolism ([place]) 06/16/2018  . Chronic HFrEF (heart failure with reduced ejection fraction) ([place])   . CVA (cerebral vascular accident) ([place]) 06/08/2018  . CHF (congestive heart failure) ([place]) 05/08/2018  . Hyperlipidemia LDL goal <70 09/23/2017  . OAB (overactive bladder) 01/09/2016  . SUI (stress urinary incontinence), male 01/09/2016  . Hypersomnia 10/30/2015  . Obesity (BMI 30.0-34.9) 10/30/2015  . Routine general medical examination at a health care facility 06/04/2015  . Medicare annual wellness visit, subsequent 06/04/2015  . Hereditary and idiopathic peripheral neuropathy 09/14/2014  . Rectal cancer ([place]) 02/01/2013  . Obstructive sleep apnea 07/29/2010  . GOUT 05/11/2007  . ERECTILE DYSFUNCTION, MILD 05/11/2007  .  Essential hypertension 05/11/2007    Conditions to be addressed/monitored:HTN and ESRD  Care Plan : General Plan of Care (Adult)  Updates made by Knox Royalty, RN since 09/18/2020 12:00 AM  Completed 09/18/2020  Problem: Health Promotion or Disease Self-Management (General Plan of Care) Resolved 09/18/2020  Priority: Medium  Note:   09/18/20: Received notification that patient was transferred from hospital to residential hospice at [place] : Hypertension (Adult)  Updates made by Knox Royalty, RN since 09/18/2020 12:00 AM    Problem: Hypertension (Hypertension)   Priority: Medium    Long-Range Goal: Hypertension Monitored Completed 09/18/2020  Start Date: 08/22/2020  Expected End Date: 10/21/2020  Recent Progress: On track  Priority: Medium  Note:   09/18/20: Received notification that patient was transferred from hospital to residential hospice at [facility]:  No further follow up required: CCM case closed today  Oneta Rack, RN, BSN, [place] [phone]: direct office [phone]: mobile

## 2020-09-18 NOTE — Telephone Encounter (Signed)
Lm on vm for patient's wife to return call.   Appointment for 09/25/20 has been cancelled.

## 2020-09-19 ENCOUNTER — Ambulatory Visit: Payer: Medicare Other | Admitting: Podiatry

## 2020-09-19 NOTE — Telephone Encounter (Signed)
Left detailed message on home voicemail for wife with the information below. Advised wife to give [place] a call back if she had any concerns.

## 2020-09-25 ENCOUNTER — Ambulatory Visit: Payer: Medicare Other | Admitting: Gastroenterology

## 2020-09-27 ENCOUNTER — Telehealth: Payer: Medicare Other

## 2020-10-04 ENCOUNTER — Telehealth: Payer: Self-pay | Admitting: Pharmacist

## 2020-10-04 NOTE — Progress Notes (Addendum)
Patient has passed away.

## 2020-10-05 DEATH — deceased

## 2020-11-06 NOTE — Telephone Encounter (Signed)
See phone note

## 2021-06-11 IMAGING — CT CT ABDOMEN AND PELVIS WITHOUT CONTRAST
2 of 4 series · 15 of 46 positions shown, 17 images · non-contrast
Comparison: CT CAP 12/01/2017

CLINICAL DATA: Mid abdominal pain and diarrhea. History of colon
cancer 3 years prior.

EXAM:
CT ABDOMEN AND PELVIS WITHOUT CONTRAST
TECHNIQUE: Multidetector CT imaging of the abdomen and pelvis was performed
following the standard protocol without IV contrast.

[Series 3: a/p w/o 5mm · axial · non-contrast · 0.66mm/px · z∈[+1077,+1502]mm · 12 of 95 slices shown, 14 images]
[im 5/95  soft-tissue]
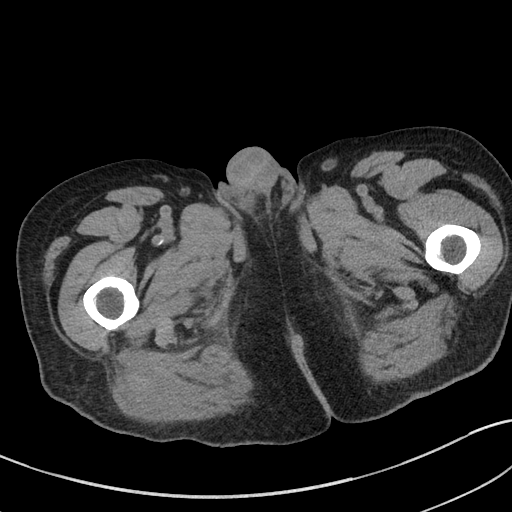
[im 5/95  bone]
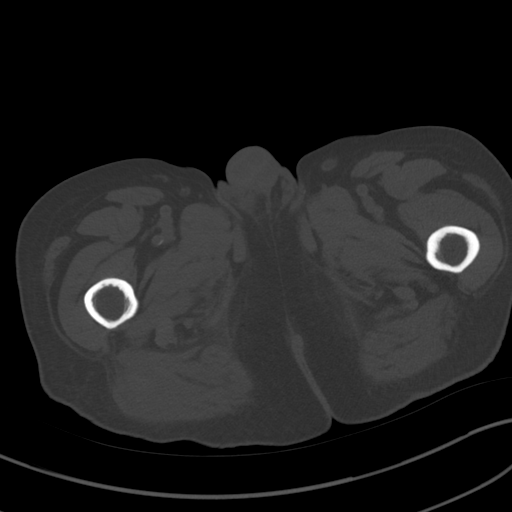
[im 13/95  soft-tissue]
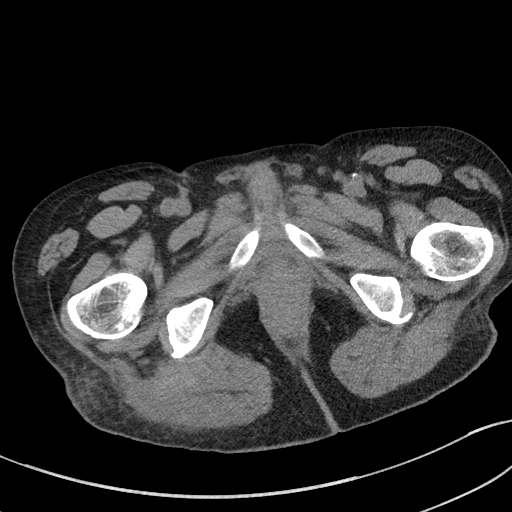
[im 21/95  soft-tissue]
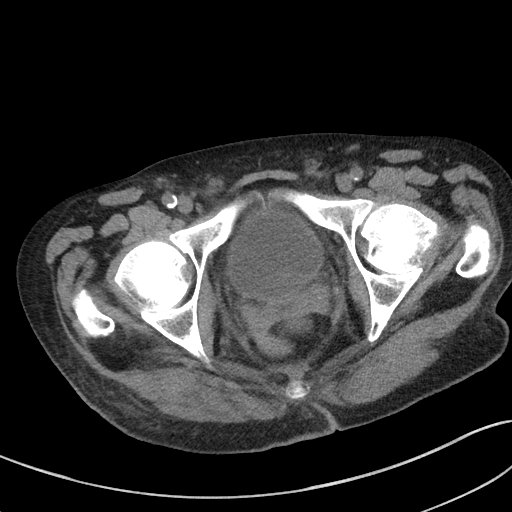
[im 29/95  soft-tissue]
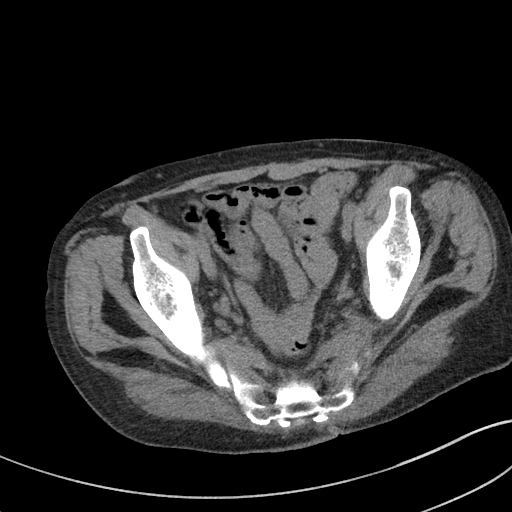
[im 37/95  soft-tissue]
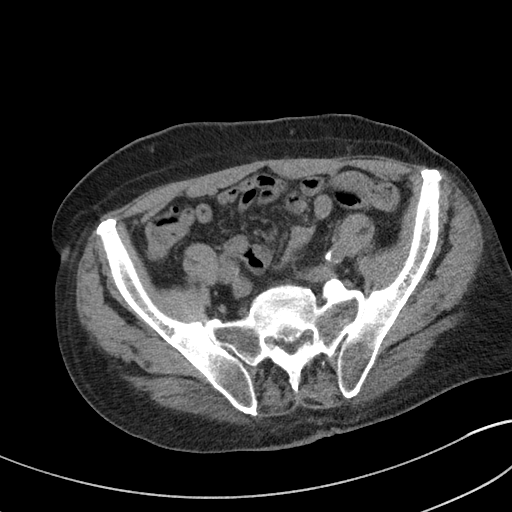
[im 45/95  soft-tissue]
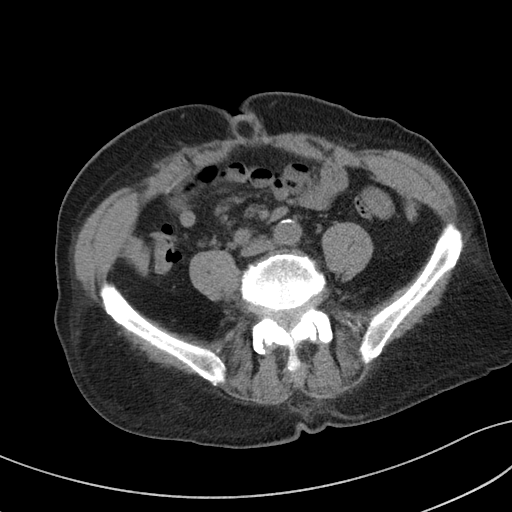
[im 50/95  soft-tissue]
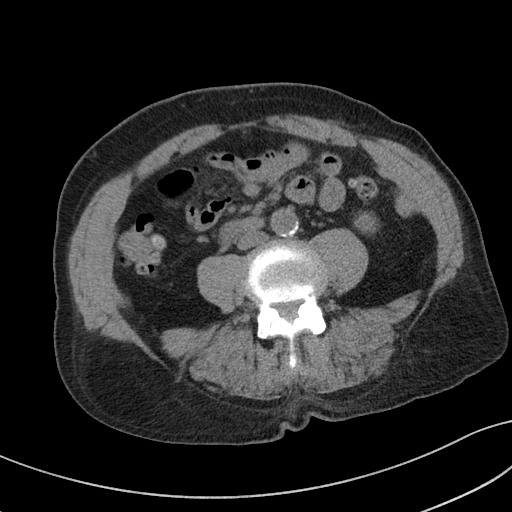
[im 58/95  soft-tissue]
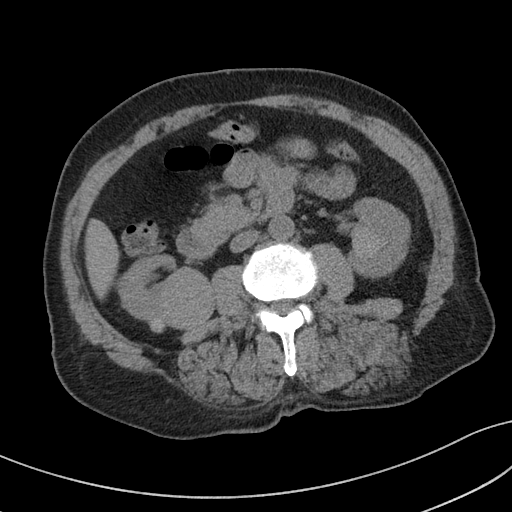
[im 66/95  soft-tissue]
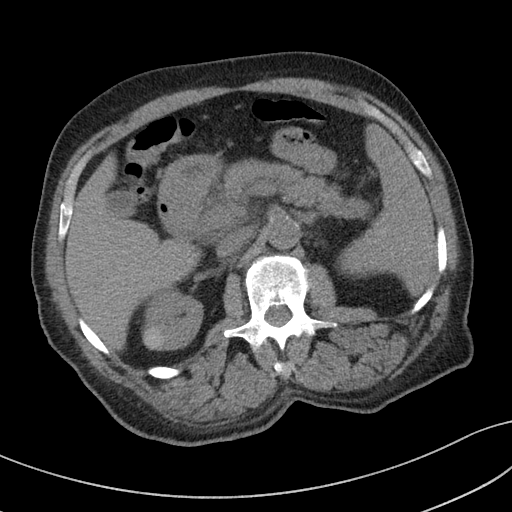
[im 66/95  bone]
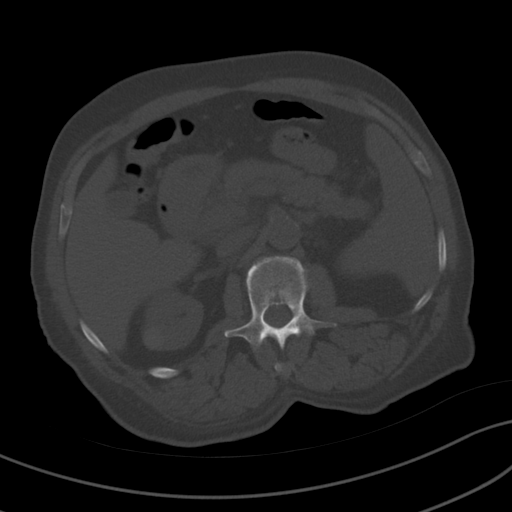
[im 74/95  soft-tissue]
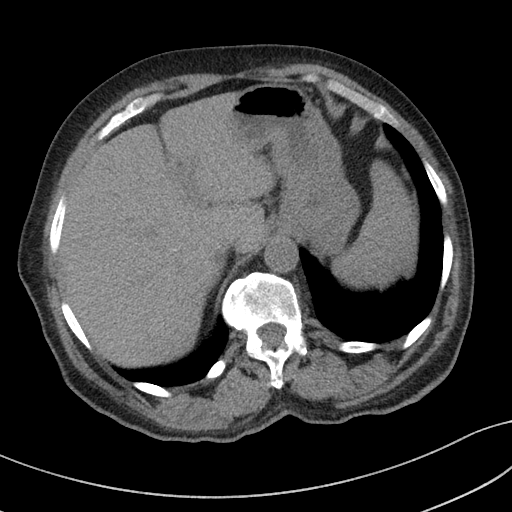
[im 82/95  soft-tissue]
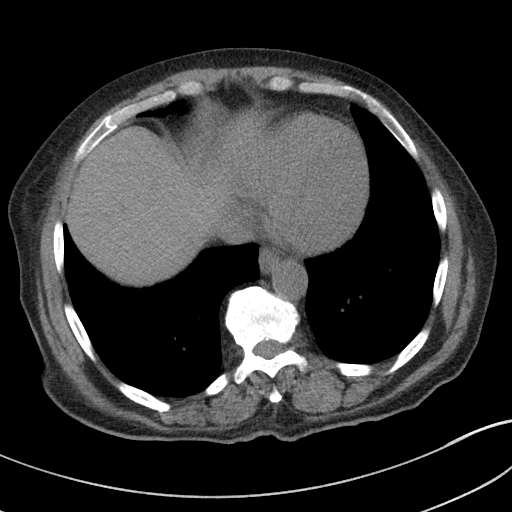
[im 90/95  soft-tissue]
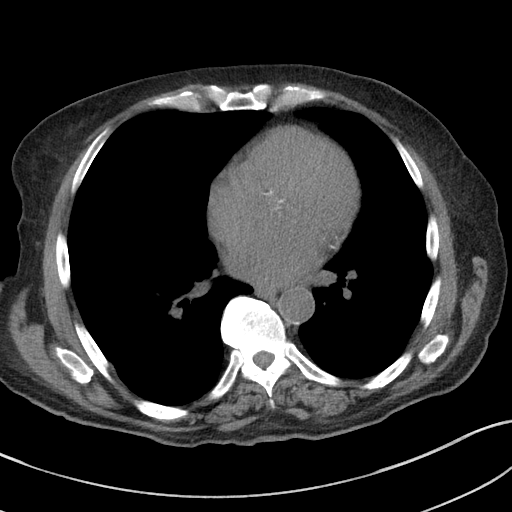

[Series 6: a/p w/o cor · coronal · non-contrast · 0.59mm/px · 3 of 126 slices shown]
[im 42/126  soft-tissue]
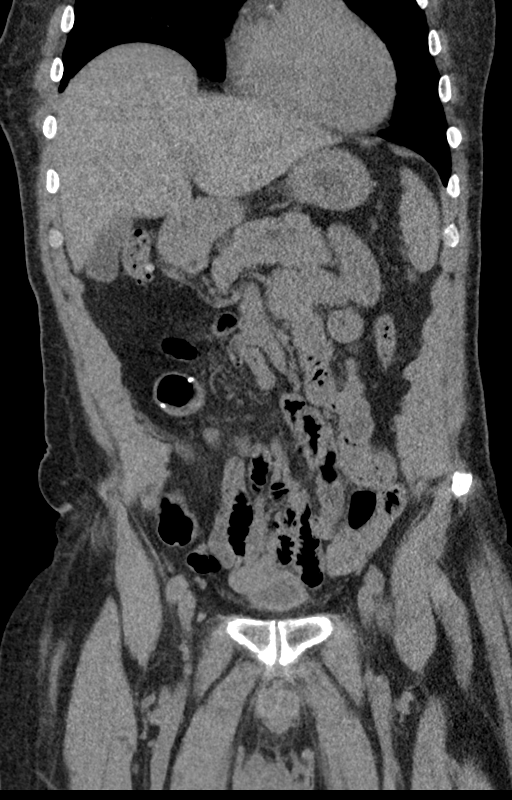
[im 56/126  soft-tissue]
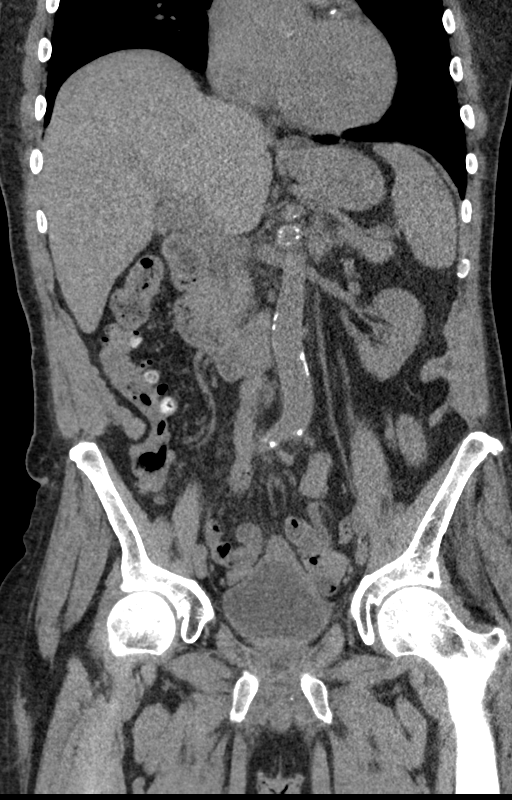
[im 70/126  soft-tissue]
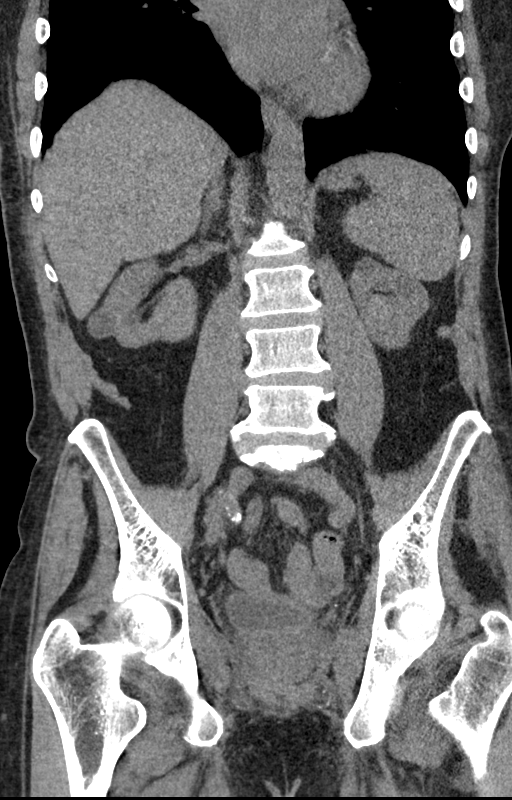

[15 of 46 positions shown; findings below may reference images not displayed]

FINDINGS: Lower chest: Normal heart size. Lung bases are clear. No pleural
effusion. Stable appearing nodularity along the right hemidiaphragm
measuring up to 6 mm (image 73; series 6).

Hepatobiliary: Liver is normal in size and contour. Gallbladder is
unremarkable. No intrahepatic or extrahepatic biliary ductal
dilatation.

Pancreas: Unremarkable

Spleen: Unremarkable

Adrenals/Urinary Tract: Normal adrenal glands. Redemonstrated
bilateral high attenuation renal lesions. Lesion within the superior
pole of the right kidney has slightly increased in size measuring
1.5 cm, previously 1.0 cm. Additionally there is a similar-appearing
4.1 cm isodense lesion exophytic inferior pole right kidney. No
hydronephrosis. Urinary bladder is unremarkable.

Stomach/Bowel: Pancolonic diverticulosis. No CT evidence to suggest
acute diverticulitis. No evidence for small bowel obstruction.
Postsurgical changes right lower quadrant. Within the gastric
antrum/proximal duodenum (image 32; series 3) there is intraluminal
gas which appears to be outlining a soft tissue mass. No significant
surrounding fat stranding.

Vascular/Lymphatic: Normal caliber abdominal aorta. Peripheral
calcified atherosclerotic plaque. No retroperitoneal
lymphadenopathy.

Reproductive: Unremarkable.

Other: None.

Musculoskeletal: Findings suggestive of AVN involving the left
femoral head. Lumbar spine degenerative changes.
IMPRESSION: 1. Within the gastric antrum/proximal duodenum there is gas which
appears to be outlining a soft tissue mass. This may be gas
outlining the pylorus however the possibility of true gastric
antral/proximal duodenum mass is not excluded. Recommend further
evaluation with upper endoscopy.
2. Bilateral renal lesions which are indeterminate on current
noncontrast enhanced examination. Recommend further evaluation with
pre and post contrast-enhanced MRI for definitive characterization
and to exclude the possibility of enhancing renal mass.

## 2021-06-19 ENCOUNTER — Ambulatory Visit: Payer: Medicare Other | Admitting: Cardiology

## 2022-05-16 LAB — PROINSULIN/INSULIN RATIO
